# Patient Record
Sex: Female | Born: 1945
Health system: Southern US, Community
[De-identification: ages and names within clinical notes are randomized; demographics above are authoritative.]

## PROBLEM LIST (undated history)

## (undated) DIAGNOSIS — J189 Pneumonia, unspecified organism: Secondary | ICD-10-CM

## (undated) DIAGNOSIS — I469 Cardiac arrest, cause unspecified: Secondary | ICD-10-CM

## (undated) DIAGNOSIS — I5042 Chronic combined systolic (congestive) and diastolic (congestive) heart failure: Secondary | ICD-10-CM

## (undated) DIAGNOSIS — I4891 Unspecified atrial fibrillation: Secondary | ICD-10-CM

## (undated) DIAGNOSIS — I10 Essential (primary) hypertension: Secondary | ICD-10-CM

## (undated) DIAGNOSIS — R32 Unspecified urinary incontinence: Secondary | ICD-10-CM

## (undated) DIAGNOSIS — H409 Unspecified glaucoma: Secondary | ICD-10-CM

## (undated) DIAGNOSIS — I255 Ischemic cardiomyopathy: Secondary | ICD-10-CM

## (undated) DIAGNOSIS — I499 Cardiac arrhythmia, unspecified: Secondary | ICD-10-CM

## (undated) DIAGNOSIS — L209 Atopic dermatitis, unspecified: Secondary | ICD-10-CM

## (undated) DIAGNOSIS — J449 Chronic obstructive pulmonary disease, unspecified: Secondary | ICD-10-CM

## (undated) DIAGNOSIS — I779 Disorder of arteries and arterioles, unspecified: Secondary | ICD-10-CM

## (undated) DIAGNOSIS — T783XXA Angioneurotic edema, initial encounter: Secondary | ICD-10-CM

## (undated) DIAGNOSIS — M545 Low back pain, unspecified: Secondary | ICD-10-CM

## (undated) DIAGNOSIS — I2510 Atherosclerotic heart disease of native coronary artery without angina pectoris: Secondary | ICD-10-CM

## (undated) DIAGNOSIS — E119 Type 2 diabetes mellitus without complications: Secondary | ICD-10-CM

## (undated) DIAGNOSIS — H9190 Unspecified hearing loss, unspecified ear: Secondary | ICD-10-CM

## (undated) DIAGNOSIS — K219 Gastro-esophageal reflux disease without esophagitis: Secondary | ICD-10-CM

## (undated) DIAGNOSIS — I4819 Other persistent atrial fibrillation: Secondary | ICD-10-CM

## (undated) DIAGNOSIS — N183 Chronic kidney disease, stage 3 unspecified: Secondary | ICD-10-CM

## (undated) DIAGNOSIS — M109 Gout, unspecified: Secondary | ICD-10-CM

## (undated) DIAGNOSIS — K922 Gastrointestinal hemorrhage, unspecified: Secondary | ICD-10-CM

## (undated) DIAGNOSIS — I509 Heart failure, unspecified: Secondary | ICD-10-CM

## (undated) DIAGNOSIS — Z951 Presence of aortocoronary bypass graft: Secondary | ICD-10-CM

## (undated) DIAGNOSIS — R519 Headache, unspecified: Secondary | ICD-10-CM

## (undated) DIAGNOSIS — I5030 Unspecified diastolic (congestive) heart failure: Secondary | ICD-10-CM

## (undated) DIAGNOSIS — Z9289 Personal history of other medical treatment: Secondary | ICD-10-CM

## (undated) DIAGNOSIS — Z950 Presence of cardiac pacemaker: Secondary | ICD-10-CM

## (undated) DIAGNOSIS — M1990 Unspecified osteoarthritis, unspecified site: Secondary | ICD-10-CM

## (undated) DIAGNOSIS — R200 Anesthesia of skin: Secondary | ICD-10-CM

## (undated) DIAGNOSIS — E876 Hypokalemia: Secondary | ICD-10-CM

## (undated) DIAGNOSIS — I480 Paroxysmal atrial fibrillation: Secondary | ICD-10-CM

## (undated) DIAGNOSIS — J45909 Unspecified asthma, uncomplicated: Secondary | ICD-10-CM

## (undated) DIAGNOSIS — K228 Other specified diseases of esophagus: Secondary | ICD-10-CM

## (undated) DIAGNOSIS — D259 Leiomyoma of uterus, unspecified: Secondary | ICD-10-CM

## (undated) DIAGNOSIS — Z9119 Patient's noncompliance with other medical treatment and regimen: Secondary | ICD-10-CM

## (undated) DIAGNOSIS — R7303 Prediabetes: Secondary | ICD-10-CM

## (undated) DIAGNOSIS — Z91199 Patient's noncompliance with other medical treatment and regimen due to unspecified reason: Secondary | ICD-10-CM

## (undated) DIAGNOSIS — J9600 Acute respiratory failure, unspecified whether with hypoxia or hypercapnia: Secondary | ICD-10-CM

## (undated) HISTORY — DX: Unspecified osteoarthritis, unspecified site: M19.90

## (undated) HISTORY — DX: Low back pain, unspecified: M54.50

## (undated) HISTORY — DX: Gastro-esophageal reflux disease without esophagitis: K21.9

## (undated) HISTORY — DX: Essential (primary) hypertension: I10

## (undated) HISTORY — PX: INSERT / REPLACE / REMOVE PACEMAKER: SUR710

## (undated) HISTORY — DX: Angioneurotic edema, initial encounter: T78.3XXA

## (undated) HISTORY — PX: PACEMAKER IMPLANT: EP1218

## (undated) HISTORY — DX: Headache, unspecified: R51.9

## (undated) HISTORY — DX: Leiomyoma of uterus, unspecified: D25.9

## (undated) HISTORY — DX: Chronic obstructive pulmonary disease, unspecified: J44.9

## (undated) HISTORY — DX: Unspecified asthma, uncomplicated: J45.909

## (undated) HISTORY — DX: Unspecified glaucoma: H40.9

## (undated) HISTORY — DX: Atopic dermatitis, unspecified: L20.9

## (undated) HISTORY — DX: Unspecified urinary incontinence: R32

## (undated) HISTORY — PX: TOOTH EXTRACTION: SUR596

## (undated) HISTORY — DX: Prediabetes: R73.03

---
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
## 1999-07-12 ENCOUNTER — Emergency Department (HOSPITAL_COMMUNITY): Admission: EM | Admit: 1999-07-12 | Discharge: 1999-07-12 | Payer: Self-pay | Admitting: Emergency Medicine

## 2001-05-25 ENCOUNTER — Emergency Department (HOSPITAL_COMMUNITY): Admission: EM | Admit: 2001-05-25 | Discharge: 2001-05-25 | Payer: Self-pay | Admitting: Emergency Medicine

## 2001-05-25 ENCOUNTER — Encounter: Payer: Self-pay | Admitting: Emergency Medicine

## 2002-08-21 ENCOUNTER — Emergency Department (HOSPITAL_COMMUNITY): Admission: EM | Admit: 2002-08-21 | Discharge: 2002-08-21 | Payer: Self-pay | Admitting: Emergency Medicine

## 2002-12-09 ENCOUNTER — Emergency Department (HOSPITAL_COMMUNITY): Admission: EM | Admit: 2002-12-09 | Discharge: 2002-12-09 | Payer: Self-pay | Admitting: Emergency Medicine

## 2003-02-02 ENCOUNTER — Observation Stay (HOSPITAL_COMMUNITY): Admission: EM | Admit: 2003-02-02 | Discharge: 2003-02-03 | Payer: Self-pay | Admitting: Emergency Medicine

## 2003-03-19 ENCOUNTER — Emergency Department (HOSPITAL_COMMUNITY): Admission: EM | Admit: 2003-03-19 | Discharge: 2003-03-19 | Payer: Self-pay | Admitting: Emergency Medicine

## 2003-05-06 ENCOUNTER — Other Ambulatory Visit: Admission: RE | Admit: 2003-05-06 | Discharge: 2003-05-06 | Payer: Self-pay | Admitting: Obstetrics and Gynecology

## 2004-02-28 ENCOUNTER — Emergency Department (HOSPITAL_COMMUNITY): Admission: EM | Admit: 2004-02-28 | Discharge: 2004-02-28 | Payer: Self-pay | Admitting: Emergency Medicine

## 2004-02-28 ENCOUNTER — Emergency Department (HOSPITAL_COMMUNITY): Admission: EM | Admit: 2004-02-28 | Discharge: 2004-02-28 | Payer: Self-pay | Admitting: *Deleted

## 2004-03-26 ENCOUNTER — Emergency Department (HOSPITAL_COMMUNITY): Admission: EM | Admit: 2004-03-26 | Discharge: 2004-03-26 | Payer: Self-pay | Admitting: Emergency Medicine

## 2004-08-03 ENCOUNTER — Emergency Department (HOSPITAL_COMMUNITY): Admission: EM | Admit: 2004-08-03 | Discharge: 2004-08-04 | Payer: Self-pay | Admitting: *Deleted

## 2004-10-19 ENCOUNTER — Emergency Department (HOSPITAL_COMMUNITY): Admission: EM | Admit: 2004-10-19 | Discharge: 2004-10-19 | Payer: Self-pay | Admitting: Emergency Medicine

## 2005-01-03 ENCOUNTER — Other Ambulatory Visit: Admission: RE | Admit: 2005-01-03 | Discharge: 2005-01-03 | Payer: Self-pay | Admitting: Obstetrics and Gynecology

## 2005-04-21 ENCOUNTER — Emergency Department (HOSPITAL_COMMUNITY): Admission: EM | Admit: 2005-04-21 | Discharge: 2005-04-21 | Payer: Self-pay | Admitting: Emergency Medicine

## 2006-02-09 ENCOUNTER — Other Ambulatory Visit: Admission: RE | Admit: 2006-02-09 | Discharge: 2006-02-09 | Payer: Self-pay | Admitting: Internal Medicine

## 2006-02-22 ENCOUNTER — Encounter: Admission: RE | Admit: 2006-02-22 | Discharge: 2006-02-22 | Payer: Self-pay | Admitting: Internal Medicine

## 2006-03-14 ENCOUNTER — Observation Stay (HOSPITAL_COMMUNITY): Admission: EM | Admit: 2006-03-14 | Discharge: 2006-03-15 | Payer: Self-pay | Admitting: Emergency Medicine

## 2006-12-06 ENCOUNTER — Encounter: Admission: RE | Admit: 2006-12-06 | Discharge: 2006-12-06 | Payer: Self-pay | Admitting: Internal Medicine

## 2006-12-14 ENCOUNTER — Encounter: Admission: RE | Admit: 2006-12-14 | Discharge: 2006-12-14 | Payer: Self-pay | Admitting: Internal Medicine

## 2007-03-16 ENCOUNTER — Encounter: Admission: RE | Admit: 2007-03-16 | Discharge: 2007-03-16 | Payer: Self-pay | Admitting: Internal Medicine

## 2007-09-02 ENCOUNTER — Encounter: Admission: RE | Admit: 2007-09-02 | Discharge: 2007-09-02 | Payer: Self-pay | Admitting: Internal Medicine

## 2007-09-12 ENCOUNTER — Emergency Department (HOSPITAL_COMMUNITY): Admission: EM | Admit: 2007-09-12 | Discharge: 2007-09-12 | Payer: Self-pay | Admitting: Emergency Medicine

## 2007-09-19 ENCOUNTER — Encounter: Admission: RE | Admit: 2007-09-19 | Discharge: 2007-09-19 | Payer: Self-pay | Admitting: Internal Medicine

## 2008-02-28 ENCOUNTER — Other Ambulatory Visit: Admission: RE | Admit: 2008-02-28 | Discharge: 2008-02-28 | Payer: Self-pay | Admitting: Obstetrics and Gynecology

## 2008-09-25 ENCOUNTER — Encounter: Admission: RE | Admit: 2008-09-25 | Discharge: 2008-09-25 | Payer: Self-pay | Admitting: Obstetrics and Gynecology

## 2009-02-12 ENCOUNTER — Emergency Department (HOSPITAL_COMMUNITY): Admission: EM | Admit: 2009-02-12 | Discharge: 2009-02-12 | Payer: Self-pay | Admitting: Emergency Medicine

## 2009-10-20 ENCOUNTER — Encounter: Admission: RE | Admit: 2009-10-20 | Discharge: 2009-10-20 | Payer: Self-pay | Admitting: Internal Medicine

## 2010-04-12 ENCOUNTER — Emergency Department (HOSPITAL_COMMUNITY): Admission: EM | Admit: 2010-04-12 | Discharge: 2010-04-13 | Payer: Self-pay | Admitting: Emergency Medicine

## 2010-05-14 ENCOUNTER — Other Ambulatory Visit: Admission: RE | Admit: 2010-05-14 | Discharge: 2010-05-14 | Payer: Self-pay | Admitting: Obstetrics and Gynecology

## 2010-09-05 ENCOUNTER — Encounter: Payer: Self-pay | Admitting: Internal Medicine

## 2010-11-21 LAB — CBC
HCT: 43.8 % (ref 36.0–46.0)
Hemoglobin: 14.8 g/dL (ref 12.0–15.0)
MCHC: 33.7 g/dL (ref 30.0–36.0)
MCV: 89.8 fL (ref 78.0–100.0)
Platelets: 139 10*3/uL — ABNORMAL LOW (ref 150–400)
RBC: 4.88 MIL/uL (ref 3.87–5.11)
RDW: 15.6 % — ABNORMAL HIGH (ref 11.5–15.5)
WBC: 11.4 10*3/uL — ABNORMAL HIGH (ref 4.0–10.5)

## 2010-11-21 LAB — COMPREHENSIVE METABOLIC PANEL
ALT: 13 U/L (ref 0–35)
AST: 16 U/L (ref 0–37)
Albumin: 3.3 g/dL — ABNORMAL LOW (ref 3.5–5.2)
Alkaline Phosphatase: 84 U/L (ref 39–117)
BUN: 15 mg/dL (ref 6–23)
CO2: 27 mEq/L (ref 19–32)
Calcium: 8.7 mg/dL (ref 8.4–10.5)
Chloride: 106 mEq/L (ref 96–112)
Creatinine, Ser: 1.13 mg/dL (ref 0.4–1.2)
GFR calc Af Amer: 59 mL/min — ABNORMAL LOW (ref >60)
GFR calc non Af Amer: 48 mL/min — ABNORMAL LOW (ref >60)
Glucose, Bld: 92 mg/dL (ref 70–99)
Potassium: 2.8 mEq/L — ABNORMAL LOW (ref 3.5–5.1)
Sodium: 141 mEq/L (ref 135–145)
Total Bilirubin: 0.4 mg/dL (ref 0.3–1.2)
Total Protein: 6.7 g/dL (ref 6.0–8.3)

## 2010-11-21 LAB — URINE MICROSCOPIC-ADD ON

## 2010-11-21 LAB — URINALYSIS, ROUTINE W REFLEX MICROSCOPIC
Glucose, UA: NEGATIVE mg/dL
Hgb urine dipstick: NEGATIVE
Nitrite: NEGATIVE
Protein, ur: 30 mg/dL — AB
Specific Gravity, Urine: 1.037 — ABNORMAL HIGH (ref 1.005–1.030)
Urobilinogen, UA: 1 mg/dL (ref 0.0–1.0)
pH: 6 (ref 5.0–8.0)

## 2010-11-21 LAB — DIFFERENTIAL
Basophils Absolute: 0.1 10*3/uL (ref 0.0–0.1)
Basophils Relative: 1 % (ref 0–1)
Eosinophils Absolute: 0.1 10*3/uL (ref 0.0–0.7)
Eosinophils Relative: 1 % (ref 0–5)
Lymphocytes Relative: 20 % (ref 12–46)
Lymphs Abs: 2.3 10*3/uL (ref 0.7–4.0)
Monocytes Absolute: 0.5 10*3/uL (ref 0.1–1.0)
Monocytes Relative: 4 % (ref 3–12)
Neutro Abs: 8.4 10*3/uL — ABNORMAL HIGH (ref 1.7–7.7)
Neutrophils Relative %: 74 % (ref 43–77)

## 2010-11-21 LAB — LIPASE, BLOOD: Lipase: 26 U/L (ref 11–59)

## 2010-12-31 NOTE — Discharge Summary (Signed)
NAMEJOLICIA, DELIRA NO.:  [id number]   MEDICAL RECORD NO.:  [id number]          PATIENT TYPE:  OBV   LOCATION:  1431                         FACILITY:  [facility]   PHYSICIAN:  Georgann Housekeeper, MD      DATE OF BIRTH:  Mar 12, 1946   DATE OF ADMISSION:  03/14/2006  DATE OF DISCHARGE:  03/15/2006                                 DISCHARGE SUMMARY   DISCHARGE DIAGNOSES:  1.  Angioedema with swelling of the tongue.  2.  Hypertension.  3.  Mild hypokalemia secondary to diuretic.   DISCHARGE MEDICATIONS:  1.  Tenoretic 50/25 one a day.  2.  Benadryl 25 mg 4-6 h. p.r.n.  3.  Claritin 10 mg once a day.  4.  Pepcid 20 mg twice a day.  5.  __________ 25 mg at bedtime for itching.  6.  Albuterol MDI p.r.n.  7.  The prednisone the patient has at home does not need to use it now.   FOLLOWUP:  In 2 weeks at the office.   CONDITION ON DISCHARGE:  Stable.   HOSPITAL COURSE:  A 65 year old female with a history of idiopathic  angioedema and hypertension admitted with the episode of swelling of the  tongue and the lip, unable to talk. No respiratory distress, no wheezing, no  shortness of breath. As far as the patient was started on Solu-Medrol in the  emergency room, continued on IV Solu-Medrol, IV Benadryl and Pepcid. Within  24 hours, the swelling of the tongue and lips had completely resolved. She  has been worked up for this by the allergist, Dr. Lacretia Nicks and Dr. Corinda Gubler,  unclear etiology. As far as her might need to look into she has had  prednisone at home and Benadryl. As far as her hypertension, continue on  Tenoretic. She had mild hypokalemia secondary to diuretic. She is reportedly  unable to tolerate the potassium. She said it makes her sick. I encouraged  her to get dietary potassium supplement with bananas. As far as the followup  in 2 weeks.      Georgann Housekeeper, MD  Electronically Signed     KH/MEDQ  D:  03/15/2006  T:  03/15/2006  Job:  [id number]

## 2010-12-31 NOTE — H&P (Signed)
NAME:  RENAI, LOPATA NO.:  [id number]   MEDICAL RECORD NO.:  [id number]          PATIENT TYPE:  OBV   LOCATION:  1431                         FACILITY:  [facility]   PHYSICIAN:  Georgann Housekeeper, MD      DATE OF BIRTH:  March 19, 1946   DATE OF ADMISSION:  03/14/2006  DATE OF DISCHARGE:                                HISTORY & PHYSICAL   CHIEF COMPLAINT:  Tongue and lip swelling.   HISTORY OF PRESENT ILLNESS:  A 65 year old female with history of  hypertension, idiopathic angioedema, usually gets the lip swelling and takes  prednisone and carried Epipen.  She has been worked up by the allergist, Dr.  Lacretia Nicks, as well as Dr. Corinda Gubler in the past for unclear etiology.  They  thought initially was ACE, but even after she stopped the ACE, she had  continued to have that.  Had allergy testing.  Mostly gets the lip swelling.  The patient said yesterday, she was in her usual state of health, started  having swelling in the lip and a little bit in the tongue.  She took a  prednisone and Benadryl.  Did not have any benefit.  She continues to have  the swelling in the tongue this morning, got worse, unable to talk and  swallow.  Denied any respiratory distress, no wheezing,  no chest pain or  shortness of breath.  She had no unusual foods, no new medications thus far.  She said the cholesterol medication was making her tired and achy.  She  stopped taking that.  Also the potassium pills she takes gives her headache  and she has not been taking that.  She denies any fever, no nausea, no  vomiting.   PAST MEDICAL HISTORY:  Allergy to ACE INHIBITOR.   MEDICATIONS:  1.  Tenoretic 50/25 one a day.  2.  Atarax 25 mg as needed for itching.  3.  Albuterol MDI p.r.n.  4.  Potassium 10 mg q.d. not taking regularly.  5.  Benadryl p.r.n.  6.  Prednisone p.r.n.  7.  Epipen.   PAST MEDICAL HISTORY:  1.  Angioedema idiopathic.  Seen Dr. Lacretia Nicks of allergy/immunology.      Similar  admission in June of 2004.  2.  Hypertension.  3.  Tobacco use.  4.  Urinary incontinence.  5.  Acid reflux disease.   PAST SURGICAL HISTORY:  D&C.   SOCIAL HISTORY:  Tobacco half a pack per day.  Alcohol none.  She is a  widow, one granddaughter, one grown up daughter.  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   FAMILY HISTORY:  Mother had bone cancer.  Brother and sister, two, and both  of them in [place].   PHYSICAL EXAMINATION:  VITAL SIGNS:  Temperature 98.2, blood pressure  151/60, pulse 48, respiratory rate 16.  GENERAL:  Awake, alert, in no acute distress.  Reportedly decreased swelling  in this morning.  LUNGS:  Clear.  NECK:  Supple.  CARDIOVASCULAR:  Normal S1 and S2.  EXTREMITIES:  No edema.  HEENT:  Tongue still had some  swelling, lips minimal swelling.   LABORATORY DATA:  White count 12,100.  Sodium 141, potassium 3.3, BUN 14,  creatinine 0.9, glucose 110.   IMPRESSION:  The patient is a 65 year old female with a history of  idiopathic angioedema, now presented with tongue and lip swelling, tried  prednisone at home.   Problem #1:  Angioedema, idiopathic.  Problem #2:  Hypertension.   PLAN:  Admit for observation.  IV Solu-Medrol q.6 h., IV Benadryl,  IV  Pepcid.  As far as the hypertension, continue on Tenoretic.  Mild  hypokalemia secondary to Tenoretic.  Potassium replacement.  Follow blood  pressure while she is on steroids.  Acid reflux disease on Pepcid.      Georgann Housekeeper, MD  Electronically Signed     KH/MEDQ  D:  03/14/2006  T:  03/14/2006  Job:  [id number]

## 2011-08-07 ENCOUNTER — Ambulatory Visit (INDEPENDENT_AMBULATORY_CARE_PROVIDER_SITE_OTHER): Payer: Medicare Other

## 2011-08-07 DIAGNOSIS — S93629A Sprain of tarsometatarsal ligament of unspecified foot, initial encounter: Secondary | ICD-10-CM

## 2011-08-07 DIAGNOSIS — S93409A Sprain of unspecified ligament of unspecified ankle, initial encounter: Secondary | ICD-10-CM

## 2011-08-07 DIAGNOSIS — I10 Essential (primary) hypertension: Secondary | ICD-10-CM

## 2011-08-07 DIAGNOSIS — M79609 Pain in unspecified limb: Secondary | ICD-10-CM

## 2011-08-16 DIAGNOSIS — K2289 Other specified disease of esophagus: Secondary | ICD-10-CM

## 2011-08-16 HISTORY — DX: Other specified disease of esophagus: K22.89

## 2011-08-16 HISTORY — PX: COLONOSCOPY: SHX174

## 2011-09-16 ENCOUNTER — Ambulatory Visit: Payer: Medicare Other

## 2011-09-16 ENCOUNTER — Ambulatory Visit (INDEPENDENT_AMBULATORY_CARE_PROVIDER_SITE_OTHER): Payer: Medicare Other | Admitting: Family Medicine

## 2011-09-16 ENCOUNTER — Ambulatory Visit (INDEPENDENT_AMBULATORY_CARE_PROVIDER_SITE_OTHER): Payer: Medicare Other

## 2011-09-16 VITALS — BP 178/81 | HR 56 | Temp 99.1°F | Resp 18 | Ht 68.0 in | Wt 210.0 lb

## 2011-09-16 DIAGNOSIS — I10 Essential (primary) hypertension: Secondary | ICD-10-CM

## 2011-09-16 DIAGNOSIS — M25469 Effusion, unspecified knee: Secondary | ICD-10-CM

## 2011-09-16 DIAGNOSIS — M25569 Pain in unspecified knee: Secondary | ICD-10-CM

## 2011-09-16 MED ORDER — TRAMADOL HCL 50 MG PO TABS: 50.0000 mg | ORAL_TABLET | Freq: Three times a day (TID) | ORAL | Status: AC | PRN

## 2011-09-16 MED ORDER — NAPROXEN 500 MG PO TABS: 500.0000 mg | ORAL_TABLET | Freq: Two times a day (BID) | ORAL | Status: DC

## 2011-09-16 NOTE — Progress Notes (Signed)
  Subjective:    Patient ID: [name] [name], female    DOB: 1945/09/08, 67 y.o.   MRN: [id number]  Knee Pain  The incident occurred more than 1 week ago. The incident occurred at home. The injury mechanism is unknown. The pain is present in the right knee. The quality of the pain is described as aching. The pain is severe. The pain has been constant since onset. Associated symptoms include a loss of motion. Pertinent negatives include no inability to bear weight, loss of sensation, muscle weakness, numbness or tingling.      Review of Systems  Constitutional: Negative for fever and chills.  Respiratory: Negative for shortness of breath and wheezing.   Cardiovascular: Negative for chest pain and palpitations.  Gastrointestinal: Negative for abdominal distention.  Genitourinary: Negative for dysuria and hematuria.  Musculoskeletal: Positive for joint swelling and arthralgias.  Neurological: Negative for tingling, weakness and numbness.  Psychiatric/Behavioral: Negative for behavioral problems.       Objective:   Physical Exam  Constitutional: She is oriented to person, place, and time. She appears well-developed and well-nourished.  HENT:  Head: Normocephalic and atraumatic.  Eyes: EOM are normal.  Neck: Normal range of motion. Neck supple.  Cardiovascular: Normal rate, regular rhythm and normal heart sounds.   Pulmonary/Chest: Effort normal and breath sounds normal.  Abdominal: Soft. Bowel sounds are normal.  Neurological: She is alert and oriented to person, place, and time. She has normal reflexes.  Skin: Skin is warm and dry.  Psychiatric: She has a normal mood and affect.   Right knee exam: mild knee swelling at anterior knee, superior patellar. She has weak quadriceps.  Full PROM/AROM. Neurovascular intact.        Assessment & Plan:  1. Right knee pain-most likley knee sprain. Will give pt knee brace 2. HTN- possibly high with knee pain. Asked pt to take BP meds regular  and also keep BP log.  [facility] reading (PRIMARY) by  Dr. Conley Rolls: Right knee-no fx or dislocation.

## 2012-02-06 ENCOUNTER — Other Ambulatory Visit: Payer: Self-pay | Admitting: Specialist

## 2012-02-06 DIAGNOSIS — R1310 Dysphagia, unspecified: Secondary | ICD-10-CM

## 2012-02-14 ENCOUNTER — Ambulatory Visit
Admission: RE | Admit: 2012-02-14 | Discharge: 2012-02-14 | Disposition: A | Discharge status: Home / Self Care | Payer: Medicare Other | Source: Ambulatory Visit | Attending: Specialist | Admitting: Specialist

## 2012-02-14 DIAGNOSIS — R1310 Dysphagia, unspecified: Secondary | ICD-10-CM

## 2012-03-15 ENCOUNTER — Ambulatory Visit: Payer: Medicare Other

## 2012-03-15 ENCOUNTER — Ambulatory Visit (INDEPENDENT_AMBULATORY_CARE_PROVIDER_SITE_OTHER): Payer: Medicare Other | Admitting: Family Medicine

## 2012-03-15 VITALS — BP 146/84 | HR 64 | Temp 98.1°F | Resp 16

## 2012-03-15 DIAGNOSIS — L02619 Cutaneous abscess of unspecified foot: Secondary | ICD-10-CM

## 2012-03-15 DIAGNOSIS — M79672 Pain in left foot: Secondary | ICD-10-CM

## 2012-03-15 DIAGNOSIS — L03119 Cellulitis of unspecified part of limb: Secondary | ICD-10-CM

## 2012-03-15 DIAGNOSIS — M7989 Other specified soft tissue disorders: Secondary | ICD-10-CM

## 2012-03-15 DIAGNOSIS — M79609 Pain in unspecified limb: Secondary | ICD-10-CM

## 2012-03-15 LAB — POCT CBC
Granulocyte percent: 72.2 %G (ref 37–80)
HCT, POC: 48.5 % — AB (ref 37.7–47.9)
Hemoglobin: 15.1 g/dL (ref 12.2–16.2)
Lymph, poc: 2.6 (ref 0.6–3.4)
MCH, POC: 28.4 pg (ref 27–31.2)
MCHC: 31.1 g/dL — AB (ref 31.8–35.4)
MCV: 91.1 fL (ref 80–97)
MID (cbc): 0.7 (ref 0–0.9)
MPV: 11.7 fL (ref 0–99.8)
POC Granulocyte: 8.6 — AB (ref 2–6.9)
POC LYMPH PERCENT: 22.2 %L (ref 10–50)
POC MID %: 5.6 %M (ref 0–12)
Platelet Count, POC: 166 10*3/uL (ref 142–424)
RBC: 5.32 M/uL (ref 4.04–5.48)
RDW, POC: 15.8 %
WBC: 11.9 10*3/uL — AB (ref 4.6–10.2)

## 2012-03-15 LAB — URIC ACID: Uric Acid, Serum: 9.7 mg/dL — ABNORMAL HIGH (ref 2.4–7.0)

## 2012-03-15 MED ORDER — DOXYCYCLINE HYCLATE 100 MG PO TABS: 100.0000 mg | ORAL_TABLET | Freq: Two times a day (BID) | ORAL | Status: AC

## 2012-03-15 MED ORDER — HYDROCODONE-ACETAMINOPHEN 5-325 MG PO TABS: 1.0000 | ORAL_TABLET | Freq: Four times a day (QID) | ORAL | Status: AC | PRN

## 2012-03-15 MED ORDER — IBUPROFEN 600 MG PO TABS: 600.0000 mg | ORAL_TABLET | Freq: Four times a day (QID) | ORAL | Status: AC | PRN

## 2012-03-15 NOTE — Progress Notes (Signed)
Subjective:    Patient ID: [name] [name], female    DOB: 01-Aug-1946, 66 y.o.   MRN: [id number]  HPI [name] [name] is a 66 y.o. female Complains of  L foot pain - started 2 or 3 days ago.  Nki.  But ate fried pork skins last week.  No known hx of gout.  Difficulty with weight bear - hurts to touch it or even put on shoe. Pain on top of foot.  Sometimes when more pain - moves up to ankle. No hx of DVT.  No recent air travel or prolonged car travel. No chest pain, or SOB, but has to favor leg.   TX:  Advil - 1or 2 during day.  Min relief. Alcohol, icy hot.   Primary care: Dr. Rene Paci  Review of Systems  Constitutional: Negative for fever and chills.  Musculoskeletal: Positive for arthralgias.  Skin: Positive for color change.       Red and warm - top of foot.         Objective:   Physical Exam  Constitutional: She is oriented to person, place, and time. She appears well-developed and well-nourished.  HENT:  Head: Normocephalic and atraumatic.  Cardiovascular: Normal rate, regular rhythm, normal heart sounds and intact distal pulses.   Pulmonary/Chest: Effort normal.  Musculoskeletal:       Feet:       Calf nt, no erythema proximal to dorsum of foot.   Neurological: She is alert and oriented to person, place, and time.  Skin: Skin is warm.  Psychiatric: She has a normal mood and affect. Her behavior is normal.   Results for orders placed in visit on 03/15/12  POCT CBC      Component Value Range   WBC 11.9 (*) 4.6 - 10.2 K/uL   Lymph, poc 2.6  0.6 - 3.4   POC LYMPH PERCENT 22.2  10 - 50 %L   MID (cbc) 0.7  0 - 0.9   POC MID % 5.6  0 - 12 %M   POC Granulocyte 8.6 (*) 2 - 6.9   Granulocyte percent 72.2  37 - 80 %G   RBC 5.32  4.04 - 5.48 M/uL   Hemoglobin 15.1  12.2 - 16.2 g/dL   HCT, POC 40.9 (*) 81.1 - 47.9 %   MCV 91.1  80 - 97 fL   MCH, POC 28.4  27 - 31.2 pg   MCHC 31.1 (*) 31.8 - 35.4 g/dL   RDW, POC 91.4     Platelet Count, POC 166  142 - 424 K/uL   MPV 11.7  0 -  99.8 fL   [facility] reading (PRIMARY) by  Dr. Neva Seat: L foot - NAD.      Assessment & Plan:  [name] [name] is a 66 y.o. female 1. Foot pain, left  POCT CBC, Uric Acid, DG Foot Complete Left, HYDROcodone-acetaminophen (NORCO/VICODIN) 5-325 MG per tablet, ibuprofen (ADVIL,MOTRIN) 600 MG tablet  2. Foot swelling  POCT CBC, Uric Acid, DG Foot Complete Left, ibuprofen (ADVIL,MOTRIN) 600 MG tablet  3. Cellulitis of foot  doxycycline (VIBRA-TABS) 100 MG tablet     Possible infectious (cellulitis) vs inflammatory (gout- but non known hx of this).  Check uric acid.  Start doxycycline 100mg  BID, ibuprofen 600mg  Q6h prn with food.  - short course with hx htn and watch outside BP's. If increased pain - lortab.  Sed. Crutches, post-op shoe and recheck in 24-48 hours if not improving - sooner if worse.  Patient Instructions  You can start the antibiotic - twice per day, and ibuprofen up to every 6 hours as needed with food for pain and inflammation. If you need a stronger medicine for pain - you can take the hydrocodone, but this can make you sleepy or dizzy - be careful with this medicine.  Keep a record of your blood pressures outside of the office and bring them to the next office visit, as the ibuprofen can sometimes increase this.  We will only use this as long as necessary for the foot pain.  Recheck in 2 days - Return to the clinic or go to the nearest emergency room if any of your symptoms worsen or new symptoms occur.

## 2012-03-15 NOTE — Patient Instructions (Signed)
You can start the antibiotic - twice per day, and ibuprofen up to every 6 hours as needed with food for pain and inflammation. If you need a stronger medicine for pain - you can take the hydrocodone, but this can make you sleepy or dizzy - be careful with this medicine.  Keep a record of your blood pressures outside of the office and bring them to the next office visit, as the ibuprofen can sometimes increase this.  We will only use this as long as necessary for the foot pain.  Recheck in 2 days - Return to the clinic or go to the nearest emergency room if any of your symptoms worsen or new symptoms occur.

## 2012-03-17 ENCOUNTER — Ambulatory Visit (INDEPENDENT_AMBULATORY_CARE_PROVIDER_SITE_OTHER): Payer: Medicare Other | Admitting: Family Medicine

## 2012-03-17 VITALS — BP 134/74 | HR 54 | Temp 97.6°F | Resp 16 | Ht 67.5 in | Wt 214.0 lb

## 2012-03-17 DIAGNOSIS — T887XXA Unspecified adverse effect of drug or medicament, initial encounter: Secondary | ICD-10-CM

## 2012-03-17 DIAGNOSIS — M109 Gout, unspecified: Secondary | ICD-10-CM

## 2012-03-17 DIAGNOSIS — T50905A Adverse effect of unspecified drugs, medicaments and biological substances, initial encounter: Secondary | ICD-10-CM

## 2012-03-17 DIAGNOSIS — E119 Type 2 diabetes mellitus without complications: Secondary | ICD-10-CM

## 2012-03-17 LAB — GLUCOSE, POCT (MANUAL RESULT ENTRY): POC Glucose: 102 mg/dl — AB (ref 70–99)

## 2012-03-17 MED ORDER — PREDNISONE 20 MG PO TABS: ORAL_TABLET | ORAL | Status: AC

## 2012-03-17 NOTE — Patient Instructions (Signed)
Take the prednisone as directed Return in one to 2 weeks to recheck labs. You may need to be placed on long-term medication for the gout. Continue taking the ibuprofen, but I would recommend taking a whole pill 2-3 times daily.  Gout Gout is an inflammatory condition (arthritis) caused by a buildup of uric acid crystals in the joints. Uric acid is a chemical that is normally present in the blood. Under some circumstances, uric acid can form into crystals in your joints. This causes joint redness, soreness, and swelling (inflammation). Repeat attacks are common. Over time, uric acid crystals can form into masses (tophi) near a joint, causing disfigurement. Gout is treatable and often preventable. CAUSES  The disease begins with elevated levels of uric acid in the blood. Uric acid is produced by your body when it breaks down a naturally found substance called purines. This also happens when you eat certain foods such as meats and fish. Causes of an elevated uric acid level include:  Being passed down from parent to child (heredity).   Diseases that cause increased uric acid production (obesity, psoriasis, some cancers).   Excessive alcohol use.   Diet, especially diets rich in meat and seafood.   Medicines, including certain cancer-fighting drugs (chemotherapy), diuretics, and aspirin.   Chronic kidney disease. The kidneys are no longer able to remove uric acid well.   Problems with metabolism.  Conditions strongly associated with gout include:  Obesity.   High blood pressure.   High cholesterol.   Diabetes  Not everyone with elevated uric acid levels gets gout. It is not understood why some people get gout and others do not. Surgery, joint injury, and eating too much of certain foods are some of the factors that can lead to gout. SYMPTOMS   An attack of gout comes on quickly. It causes intense pain with redness, swelling, and warmth in a joint.   Fever can occur.   Often, only  one joint is involved. Certain joints are more commonly involved:   Base of the big toe.   Knee.   Ankle.   Wrist.   Finger.  Without treatment, an attack usually goes away in a few days to weeks. Between attacks, you usually will not have symptoms, which is different from many other forms of arthritis. DIAGNOSIS  Your caregiver will suspect gout based on your symptoms and exam. Removal of fluid from the joint (arthrocentesis) is done to check for uric acid crystals. Your caregiver will give you a medicine that numbs the area (local anesthetic) and use a needle to remove joint fluid for exam. Gout is confirmed when uric acid crystals are seen in joint fluid, using a special microscope. Sometimes, blood, urine, and X-ray tests are also used. TREATMENT  There are 2 phases to gout treatment: treating the sudden onset (acute) attack and preventing attacks (prophylaxis). Treatment of an Acute Attack  Medicines are used. These include anti-inflammatory medicines or steroid medicines.   An injection of steroid medicine into the affected joint is sometimes necessary.   The painful joint is rested. Movement can worsen the arthritis.   You may use warm or cold treatments on painful joints, depending which works best for you.   Discuss the use of coffee, vitamin C, or cherries with your caregiver. These may be helpful treatment options.  Treatment to Prevent Attacks After the acute attack subsides, your caregiver may advise prophylactic medicine. These medicines either help your kidneys eliminate uric acid from your body or decrease your  uric acid production. You may need to stay on these medicines for a very long time. The early phase of treatment with prophylactic medicine can be associated with an increase in acute gout attacks. For this reason, during the first few months of treatment, your caregiver may also advise you to take medicines usually used for acute gout treatment. Be sure you  understand your caregiver's directions. You should also discuss dietary treatment with your caregiver. Certain foods such as meats and fish can increase uric acid levels. Other foods such as dairy can decrease levels. Your caregiver can give you a list of foods to avoid. HOME CARE INSTRUCTIONS   Do not take aspirin to relieve pain. This raises uric acid levels.   Only take over-the-counter or prescription medicines for pain, discomfort, or fever as directed by your caregiver.   Rest the joint as much as possible. When in bed, keep sheets and blankets off painful areas.   Keep the affected joint raised (elevated).   Use crutches if the painful joint is in your leg.   Drink enough water and fluids to keep your urine clear or pale yellow. This helps your body get rid of uric acid. Do not drink alcoholic beverages. They slow the passage of uric acid.   Follow your caregiver's dietary instructions. Pay careful attention to the amount of protein you eat. Your daily diet should emphasize fruits, vegetables, whole grains, and fat-free or low-fat milk products.   Maintain a healthy body weight.  SEEK MEDICAL CARE IF:   You have an oral temperature above 102 F (38.9 C).   You develop diarrhea, vomiting, or any side effects from medicines.   You do not feel better in 24 hours, or you are getting worse.  SEEK IMMEDIATE MEDICAL CARE IF:   Your joint becomes suddenly more tender and you have:   Chills.   An oral temperature above 102 F (38.9 C), not controlled by medicine.  MAKE SURE YOU:   Understand these instructions.   Will watch your condition.   Will get help right away if you are not doing well or get worse.  Document Released: 07/29/2000 Document Revised: 07/21/2011 Document Reviewed: 11/09/2009 [facility] Patient Information 2012 [place], [place].

## 2012-03-17 NOTE — Progress Notes (Signed)
Subjective: Patient is here for recheck on left foot. It is less painful than it was, but it is still hurting her. She has only been taking one half of Motrin twice a day because she did not want to raise her blood pressure. It hurts very badly she walks on it. She is a CNA and cares for a patient. She hasn't made says she has not missed work.  Objective: Left foot is swollen on the lateral half of the foot from the tarsal bones down to the PIP joints of the third fourth and fifth toe. It is mildly erythematous. Is very tender, especially in the tarsal metatarsal joints.    Results for orders placed in visit on 03/15/12  POCT CBC      Component Value Range   WBC 11.9 (*) 4.6 - 10.2 K/uL   Lymph, poc 2.6  0.6 - 3.4   POC LYMPH PERCENT 22.2  10 - 50 %L   MID (cbc) 0.7  0 - 0.9   POC MID % 5.6  0 - 12 %M   POC Granulocyte 8.6 (*) 2 - 6.9   Granulocyte percent 72.2  37 - 80 %G   RBC 5.32  4.04 - 5.48 M/uL   Hemoglobin 15.1  12.2 - 16.2 g/dL   HCT, POC 11.9 (*) 14.7 - 47.9 %   MCV 91.1  80 - 97 fL   MCH, POC 28.4  27 - 31.2 pg   MCHC 31.1 (*) 31.8 - 35.4 g/dL   RDW, POC 82.9     Platelet Count, POC 166  142 - 424 K/uL   MPV 11.7  0 - 99.8 fL  URIC ACID      Component Value Range   Uric Acid, Serum 9.7 (*) 2.4 - 7.0 mg/dL   Assessment Acute gouty arthritis Hyperuricemia  Plan: Do a fingerstick sugar to make sure that that is okay. If it is normal we will proceed with placed her on steroids. She needs to return again in a week or 2 at which time a followup uric acid level will need to be done. If her uric acid remains high she should be placed on long-term medications such as allopurinol. She apparently had one other mild flare that subsided on its own in the past.  Check glucose before prescribed prednisone.

## 2012-04-03 ENCOUNTER — Ambulatory Visit (INDEPENDENT_AMBULATORY_CARE_PROVIDER_SITE_OTHER): Payer: Medicare Other | Admitting: Family Medicine

## 2012-04-03 VITALS — BP 154/76 | HR 56 | Temp 99.1°F | Resp 14 | Ht 67.0 in | Wt 215.6 lb

## 2012-04-03 DIAGNOSIS — M79671 Pain in right foot: Secondary | ICD-10-CM

## 2012-04-03 DIAGNOSIS — M79672 Pain in left foot: Secondary | ICD-10-CM

## 2012-04-03 DIAGNOSIS — M109 Gout, unspecified: Secondary | ICD-10-CM

## 2012-04-03 DIAGNOSIS — M79609 Pain in unspecified limb: Secondary | ICD-10-CM

## 2012-04-03 MED ORDER — INDOMETHACIN 50 MG PO CAPS: 50.0000 mg | ORAL_CAPSULE | Freq: Three times a day (TID) | ORAL | Status: AC

## 2012-04-03 NOTE — Patient Instructions (Addendum)
Purine Restricted Diet  A low-purine diet consists of foods that reduce uric acid made in your body.  INDICATIONS FOR USE    Your caregiver may ask you to follow a low-purine diet to reduce gout flairs.    GUIDELINES    Avoid high-purine foods, including all alcohol, yeast extracts taken as supplements, and sauces made from meats (like gravy). Do not eat high-purine meats, including anchovies, sardines, herring, mussels, tuna, codfish, scallops, trout, haddock, bacon, organ meats, tripe, goose, wild game, and sweetbreads.    Grains   Allowed/Recommended: All, except those listed to consume in moderation.    Consume in Moderation: Oatmeal (? cup uncooked daily), wheat bran or germ ( cup daily), and whole grains.   Vegetables   Allowed/Recommended: All, except those listed to consume in moderation.    Consume in Moderation: Asparagus, cauliflower, spinach, mushrooms, and green peas ( cup daily).   Fruit   Allowed/Recommended: All.    Consume in Moderation: None.   Meat and Meat Substitutes   Allowed/Recommended: Eggs, nuts, and peanut butter.    Consume in Moderation: Limit to 4 to 6 oz daily. Avoid high-purine meats. Lentils, peas, and dried beans (1 cup daily).   Milk   Allowed/Recommended: All. Choose low-fat or skim when possible.    Consume in Moderation: None.   Fats and Oils   Allowed/Recommended: All.    Consume in Moderation: None.   Beverages   Allowed/Recommended: All, except those listed to avoid.    Avoid: All alcohol.   Condiments/Miscellaneous   Allowed/Recommended: All, except those listed to consume in moderation.    Consume in Moderation: Bouillon and meat-based broths and soups.   Document Released: 11/26/2010 Document Revised: 07/21/2011 Document Reviewed: 11/26/2010  ExitCare Patient Information 2012 [employer].    Gout   Gout is an inflammatory condition (arthritis) caused by a buildup of uric acid crystals in the joints. Uric acid is a chemical that is normally present in the blood. Under some circumstances, uric acid can form into crystals in your joints. This causes joint redness, soreness, and swelling (inflammation). Repeat attacks are common. Over time, uric acid crystals can form into masses (tophi) near a joint, causing disfigurement. Gout is treatable and often preventable.  CAUSES    The disease begins with elevated levels of uric acid in the blood. Uric acid is produced by your body when it breaks down a naturally found substance called purines. This also happens when you eat certain foods such as meats and fish. Causes of an elevated uric acid level include:   Being passed down from parent to child (heredity).    Diseases that cause increased uric acid production (obesity, psoriasis, some cancers).    Excessive alcohol use.    Diet, especially diets rich in meat and seafood.    Medicines, including certain cancer-fighting drugs (chemotherapy), diuretics, and aspirin.    Chronic kidney disease. The kidneys are no longer able to remove uric acid well.    Problems with metabolism.   Conditions strongly associated with gout include:   Obesity.    High blood pressure.    High cholesterol.    Diabetes.   Not everyone with elevated uric acid levels gets gout. It is not understood why some people get gout and others do not. Surgery, joint injury, and eating too much of certain foods are some of the factors that can lead to gout.  SYMPTOMS     An attack of gout comes on quickly. It causes   intense pain with redness, swelling, and warmth in a joint.    Fever can occur.    Often, only one joint is involved. Certain joints are more commonly involved:    Base of the big toe.    Knee.    Ankle.    Wrist.    Finger.    Without treatment, an attack usually goes away in a few days to weeks. Between attacks, you usually will not have symptoms, which is different from many other forms of arthritis.  DIAGNOSIS    Your caregiver will suspect gout based on your symptoms and exam. Removal of fluid from the joint (arthrocentesis) is done to check for uric acid crystals. Your caregiver will give you a medicine that numbs the area (local anesthetic) and use a needle to remove joint fluid for exam. Gout is confirmed when uric acid crystals are seen in joint fluid, using a special microscope. Sometimes, blood, urine, and X-ray tests are also used.  TREATMENT    There are 2 phases to gout treatment: treating the sudden onset (acute) attack and preventing attacks (prophylaxis).  Treatment of an Acute Attack   Medicines are used. These include anti-inflammatory medicines or steroid medicines.    An injection of steroid medicine into the affected joint is sometimes necessary.    The painful joint is rested. Movement can worsen the arthritis.    You may use warm or cold treatments on painful joints, depending which works best for you.    Discuss the use of coffee, vitamin C, or cherries with your caregiver. These may be helpful treatment options.   Treatment to Prevent Attacks  After the acute attack subsides, your caregiver may advise prophylactic medicine. These medicines either help your kidneys eliminate uric acid from your body or decrease your uric acid production. You may need to stay on these medicines for a very long time.  The early phase of treatment with prophylactic medicine can be associated with an increase in acute gout attacks. For this reason, during the first few months of treatment, your caregiver may also advise you to take medicines usually used for acute gout treatment. Be sure you understand your caregiver's directions.   You should also discuss dietary treatment with your caregiver. Certain foods such as meats and fish can increase uric acid levels. Other foods such as dairy can decrease levels. Your caregiver can give you a list of foods to avoid.  HOME CARE INSTRUCTIONS     Do not take aspirin to relieve pain. This raises uric acid levels.    Only take over-the-counter or prescription medicines for pain, discomfort, or fever as directed by your caregiver.    Rest the joint as much as possible. When in bed, keep sheets and blankets off painful areas.    Keep the affected joint raised (elevated).    Use crutches if the painful joint is in your leg.    Drink enough water and fluids to keep your urine clear or pale yellow. This helps your body get rid of uric acid. Do not drink alcoholic beverages. They slow the passage of uric acid.    Follow your caregiver's dietary instructions. Pay careful attention to the amount of protein you eat. Your daily diet should emphasize fruits, vegetables, whole grains, and fat-free or low-fat milk products.    Maintain a healthy body weight.   SEEK MEDICAL CARE IF:     You have an oral temperature above 102 F (38.9 C).    You   develop diarrhea, vomiting, or any side effects from medicines.    You do not feel better in 24 hours, or you are getting worse.   SEEK IMMEDIATE MEDICAL CARE IF:     Your joint becomes suddenly more tender and you have:    Chills.    An oral temperature above 102 F (38.9 C), not controlled by medicine.   MAKE SURE YOU:     Understand these instructions.    Will watch your condition.    Will get help right away if you are not doing well or get worse.   Document Released: 07/29/2000 Document Revised: 07/21/2011 Document Reviewed: 11/09/2009  Exit[employer] Patient Information 2012 [employer].

## 2012-04-03 NOTE — Progress Notes (Signed)
[facility]:  Office Visit  Chief Complaint:  Chief Complaint  Patient presents with  . Neck Pain  . Foot Pain    left foot      HPI: [name] [name] is a 66 y.o. female who complains of  Left foot pain, gouty flareup-eating pork and beans. Has had this and treated with steroids and seemed to improve but not completly resolved.  Would like to try something to completely resolve gout that is affordable  Past Medical History  Diagnosis Date  . Hypertension    No past surgical history on file. History   Social History  . Marital Status: Widowed    Spouse Name: N/A    Number of Children: N/A  . Years of Education: N/A   Social History Main Topics  . Smoking status: Current Everyday Smoker -- 1.0 packs/day for 32 years  . Smokeless tobacco: None  . Alcohol Use: No  . Drug Use: No  . Sexually Active: None   Other Topics Concern  . None   Social History Narrative  . None   Family History  Problem Relation Age of Onset  . Cancer Mother   . Heart disease Father    No Known Allergies Prior to Admission medications   Medication Sig Start Date End Date Taking? Authorizing Provider  atenolol-chlorthalidone (TENORETIC) 100-25 MG per tablet Take 1 tablet by mouth daily.   Yes Historical Provider, MD  cetirizine (ZYRTEC) 10 MG tablet Take 10 mg by mouth as needed.    Yes Historical Provider, MD  indomethacin (INDOCIN) 50 MG capsule Take 1 capsule (50 mg total) by mouth 3 (three) times daily with meals. 04/03/12 07/02/12  Indria Bishara P Kenniel Bergsma, DO     ROS: The patient denies fevers, chills, night sweats, unintentional weight loss, chest pain, palpitations, wheezing, dyspnea on exertion, nausea, vomiting, abdominal pain, dysuria, hematuria, melena, numbness, weakness, or tingling.       All other systems have been reviewed and were otherwise negative with the exception of those mentioned in the HPI and as above.    PHYSICAL EXAM: Filed Vitals:   04/03/12 1504  BP:  154/76  Pulse: 56  Temp: 99.1 F (37.3 C)  Resp: 14   Filed Vitals:   04/03/12 1504  Height: 5\' 7"  (1.702 m)  Weight: 215 lb 9.6 oz (97.796 kg)   Body mass index is 33.77 kg/(m^2).  General: Alert, no acute distress HEENT:  Normocephalic, atraumatic, oropharynx patent.  Cardiovascular:  Regular rate and rhythm, no rubs murmurs or gallops.  No Carotid bruits, radial pulse intact. No pedal edema.  Respiratory: Clear to auscultation bilaterally.  No wheezes, rales, or rhonchi.  No cyanosis, no use of accessory musculature GI: No organomegaly, abdomen is soft and non-tender, positive bowel sounds.  No masses. Skin: No rashes. Neurologic: Facial musculature symmetric. Psychiatric: Patient is appropriate throughout our interaction. Lymphatic: No cervical lymphadenopathy Musculoskeletal: Gait limp. Left laterl foot mild swelling and tenderness   LABS: Results for orders placed in visit on 03/17/12  GLUCOSE, POCT (MANUAL RESULT ENTRY)      Component Value Range   POC Glucose 102 (*) 70 - 99 mg/dl     EKG/XRAY:   Primary read interpreted by Dr. Conley Rolls at [facility].   ASSESSMENT/PLAN: Encounter Diagnoses  Name Primary?  Marland Kitchen Foot pain, bilateral Yes  . Gout     Rx Indomethacin 50 mg TID with food. Consider colchicine if no improvement.  Consider  rx allopurinol once out of  acute flareup. She may want to get her uric acid rechecked.  Need to consider whether her thiazide diuretic is also a factor in gouty flares. D/w patient that this needs to be further evaluated if she continues to get flare ups. She has had 1, maybe 2 episode so far. Would like to hold off on any start/discontinuation of meds until establish if this is a consistent pattern Advise to avoid gout inducing foods    Darrly Loberg PHUONG, DO 04/03/2012 4:52 PM

## 2012-05-04 ENCOUNTER — Other Ambulatory Visit: Payer: Self-pay | Admitting: Internal Medicine

## 2012-05-04 DIAGNOSIS — Z1231 Encounter for screening mammogram for malignant neoplasm of breast: Secondary | ICD-10-CM

## 2012-05-21 ENCOUNTER — Ambulatory Visit: Payer: Medicare Other

## 2012-05-25 ENCOUNTER — Ambulatory Visit: Payer: Medicare Other

## 2012-07-31 ENCOUNTER — Ambulatory Visit
Admission: RE | Admit: 2012-07-31 | Discharge: 2012-07-31 | Disposition: A | Discharge status: Home / Self Care | Payer: Medicare Other | Source: Ambulatory Visit | Attending: Internal Medicine | Admitting: Internal Medicine

## 2012-07-31 DIAGNOSIS — Z1231 Encounter for screening mammogram for malignant neoplasm of breast: Secondary | ICD-10-CM

## 2012-10-25 ENCOUNTER — Ambulatory Visit (INDEPENDENT_AMBULATORY_CARE_PROVIDER_SITE_OTHER): Payer: Medicare Other | Admitting: Emergency Medicine

## 2012-10-25 VITALS — BP 171/90 | HR 51 | Temp 97.7°F | Resp 17 | Ht 68.0 in | Wt 216.0 lb

## 2012-10-25 DIAGNOSIS — M25569 Pain in unspecified knee: Secondary | ICD-10-CM

## 2012-10-25 DIAGNOSIS — M25561 Pain in right knee: Secondary | ICD-10-CM

## 2012-10-25 MED ORDER — NAPROXEN 500 MG PO TABS: 500.0000 mg | ORAL_TABLET | Freq: Two times a day (BID) | ORAL | Status: DC
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
## 2012-10-25 NOTE — Patient Instructions (Addendum)
Knee Pain The knee is the complex joint between your thigh and your lower leg. It is made up of bones, tendons, ligaments, and cartilage. The bones that make up the knee are:  The femur in the thigh.  The tibia and fibula in the lower leg.  The patella or kneecap riding in the groove on the lower femur. CAUSES  Knee pain is a common complaint with many causes. A few of these causes are:  Injury, such as:  A ruptured ligament or tendon injury.  Torn cartilage.  Medical conditions, such as:  Gout  Arthritis  Infections  Overuse, over training or overdoing a physical activity. Knee pain can be minor or severe. Knee pain can accompany debilitating injury. Minor knee problems often respond well to self-care measures or get well on their own. More serious injuries may need medical intervention or even surgery. SYMPTOMS The knee is complex. Symptoms of knee problems can vary widely. Some of the problems are:  Pain with movement and weight bearing.  Swelling and tenderness.  Buckling of the knee.  Inability to straighten or extend your knee.  Your knee locks and you cannot straighten it.  Warmth and redness with pain and fever.  Deformity or dislocation of the kneecap. DIAGNOSIS  Determining what is wrong may be very straight forward such as when there is an injury. It can also be challenging because of the complexity of the knee. Tests to make a diagnosis may include:  Your caregiver taking a history and doing a physical exam.  Routine X-rays can be used to rule out other problems. X-rays will not reveal a cartilage tear. Some injuries of the knee can be diagnosed by:  Arthroscopy a surgical technique by which a small video camera is inserted through tiny incisions on the sides of the knee. This procedure is used to examine and repair internal knee joint problems. Tiny instruments can be used during arthroscopy to repair the torn knee cartilage (meniscus).  Arthrography  is a radiology technique. A contrast liquid is directly injected into the knee joint. Internal structures of the knee joint then become visible on X-ray film.  An MRI scan is a non x-ray radiology procedure in which magnetic fields and a computer produce two- or three-dimensional images of the inside of the knee. Cartilage tears are often visible using an MRI scanner. MRI scans have largely replaced arthrography in diagnosing cartilage tears of the knee.  Blood work.  Examination of the fluid that helps to lubricate the knee joint (synovial fluid). This is done by taking a sample out using a needle and a syringe. TREATMENT The treatment of knee problems depends on the cause. Some of these treatments are:  Depending on the injury, proper casting, splinting, surgery or physical therapy care will be needed.  Give yourself adequate recovery time. Do not overuse your joints. If you begin to get sore during workout routines, back off. Slow down or do fewer repetitions.  For repetitive activities such as cycling or running, maintain your strength and nutrition.  Alternate muscle groups. For example if you are a weight lifter, work the upper body on one day and the lower body the next.  Either tight or weak muscles do not give the proper support for your knee. Tight or weak muscles do not absorb the stress placed on the knee joint. Keep the muscles surrounding the knee strong.  Take care of mechanical problems.  If you have flat feet, orthotics or special shoes may help.   See your caregiver if you need help.  Arch supports, sometimes with wedges on the inner or outer aspect of the heel, can help. These can shift pressure away from the side of the knee most bothered by osteoarthritis.  A brace called an "unloader" brace also may be used to help ease the pressure on the most arthritic side of the knee.  If your caregiver has prescribed crutches, braces, wraps or ice, use as directed. The acronym for  this is PRICE. This means protection, rest, ice, compression and elevation.  Nonsteroidal anti-inflammatory drugs (NSAID's), can help relieve pain. But if taken immediately after an injury, they may actually increase swelling. Take NSAID's with food in your stomach. Stop them if you develop stomach problems. Do not take these if you have a history of ulcers, stomach pain or bleeding from the bowel. Do not take without your caregiver's approval if you have problems with fluid retention, heart failure, or kidney problems.  For ongoing knee problems, physical therapy may be helpful.  Glucosamine and chondroitin are over-the-counter dietary supplements. Both may help relieve the pain of osteoarthritis in the knee. These medicines are different from the usual anti-inflammatory drugs. Glucosamine may decrease the rate of cartilage destruction.  Injections of a corticosteroid drug into your knee joint may help reduce the symptoms of an arthritis flare-up. They may provide pain relief that lasts a few months. You may have to wait a few months between injections. The injections do have a small increased risk of infection, water retention and elevated blood sugar levels.  Hyaluronic acid injected into damaged joints may ease pain and provide lubrication. These injections may work by reducing inflammation. A series of shots may give relief for as long as 6 months.  Topical painkillers. Applying certain ointments to your skin may help relieve the pain and stiffness of osteoarthritis. Ask your pharmacist for suggestions. Many over the-counter products are approved for temporary relief of arthritis pain.  In some countries, doctors often prescribe topical NSAID's for relief of chronic conditions such as arthritis and tendinitis. A review of treatment with NSAID creams found that they worked as well as oral medications but without the serious side effects. PREVENTION  Maintain a healthy weight. Extra pounds put  more strain on your joints.  Get strong, stay limber. Weak muscles are a common cause of knee injuries. Stretching is important. Include flexibility exercises in your workouts.  Be smart about exercise. If you have osteoarthritis, chronic knee pain or recurring injuries, you may need to change the way you exercise. This does not mean you have to stop being active. If your knees ache after jogging or playing basketball, consider switching to swimming, water aerobics or other low-impact activities, at least for a few days a week. Sometimes limiting high-impact activities will provide relief.  Make sure your shoes fit well. Choose footwear that is right for your sport.  Protect your knees. Use the proper gear for knee-sensitive activities. Use kneepads when playing volleyball or laying carpet. Buckle your seat belt every time you drive. Most shattered kneecaps occur in car accidents.  Rest when you are tired. SEEK MEDICAL CARE IF:  You have knee pain that is continual and does not seem to be getting better.  SEEK IMMEDIATE MEDICAL CARE IF:  Your knee joint feels hot to the touch and you have a high fever. MAKE SURE YOU:   Understand these instructions.  Will watch your condition.  Will get help right away if you are not   doing well or get worse. Document Released: 05/29/2007 Document Revised: 10/24/2011 Document Reviewed: 05/29/2007 [employer] Patient Information 2013 [employer].  

## 2013-02-09 ENCOUNTER — Encounter (HOSPITAL_COMMUNITY): Payer: Self-pay | Admitting: Emergency Medicine

## 2013-02-09 ENCOUNTER — Emergency Department (HOSPITAL_COMMUNITY)
Admission: EM | Admit: 2013-02-09 | Discharge: 2013-02-09 | Disposition: A | Discharge status: Home / Self Care | Payer: Medicare Other | Attending: Emergency Medicine | Admitting: Emergency Medicine

## 2013-02-09 DIAGNOSIS — I10 Essential (primary) hypertension: Secondary | ICD-10-CM | POA: Insufficient documentation

## 2013-02-09 DIAGNOSIS — M25579 Pain in unspecified ankle and joints of unspecified foot: Secondary | ICD-10-CM | POA: Insufficient documentation

## 2013-02-09 DIAGNOSIS — M109 Gout, unspecified: Secondary | ICD-10-CM

## 2013-02-09 DIAGNOSIS — F17200 Nicotine dependence, unspecified, uncomplicated: Secondary | ICD-10-CM | POA: Insufficient documentation

## 2013-02-09 DIAGNOSIS — M79671 Pain in right foot: Secondary | ICD-10-CM

## 2013-02-09 DIAGNOSIS — Z79899 Other long term (current) drug therapy: Secondary | ICD-10-CM | POA: Insufficient documentation

## 2013-02-09 MED ORDER — PREDNISONE 20 MG PO TABS: ORAL_TABLET | ORAL | Status: DC

## 2013-02-09 MED ORDER — HYDROCODONE-ACETAMINOPHEN 5-325 MG PO TABS: 1.0000 | ORAL_TABLET | Freq: Four times a day (QID) | ORAL | Status: DC | PRN

## 2013-02-09 MED ORDER — PREDNISONE 20 MG PO TABS
60.0000 mg | ORAL_TABLET | Freq: Once | ORAL | Status: AC
  Administered 2013-02-09: 60 mg via ORAL
  Filled 2013-02-09: qty 3

## 2013-02-09 NOTE — ED Notes (Signed)
Pt states that on Thursday her foot started hurting and the knot on the top of her right foot then foot has been swelling ever since. Pt states she does have PMh gout.  Pt states she is currently taking liquid potassium and when she starts to feel better something like this happens.  Pt states around Christmas she had her foot swollen and was given prednisone and the swelling went down.

## 2013-02-09 NOTE — ED Provider Notes (Signed)
History    CSN: [id number] Arrival date & time 02/09/13  1239  First MD Initiated Contact with Patient 02/09/13 1246     Chief Complaint  Patient presents with  . Foot Swelling    right   (Consider location/radiation/quality/duration/timing/severity/associated sxs/prior Treatment) The history is provided by the patient.  pt c/o right goot pain and swelling for the past 3 days. Gradual onset. Constant. Dull. Moderate. Worse w palpation, movement and walking on or wearing shoes. Similar to prior episodes gout. Denies any recent medication or dietary change. No fever or chills. Denies any bite or sting to area. No trauma, twisting injury or fall. No numbness/weaness. Skin intact,.    Past Medical History  Diagnosis Date  . Hypertension    History reviewed. No pertinent past surgical history. Family History  Problem Relation Age of Onset  . Cancer Mother   . Heart disease Father    History  Substance Use Topics  . Smoking status: Current Every Day Smoker -- 1.00 packs/day for 32 years    Types: Cigarettes  . Smokeless tobacco: Not on file  . Alcohol Use: No   OB History   Grav Para Term Preterm Abortions TAB SAB Ect Mult Living                 Review of Systems  Constitutional: Negative for fever and chills.  Respiratory: Negative for shortness of breath.   Cardiovascular: Negative for chest pain and leg swelling.  Gastrointestinal: Negative for nausea and vomiting.  Skin: Negative for wound.  Neurological: Negative for weakness and numbness.    Allergies  Review of patient's allergies indicates no known allergies.  Home Medications   Current Outpatient Rx  Name  Route  Sig  Dispense  Refill  . atenolol-chlorthalidone (TENORETIC) 100-25 MG per tablet   Oral   Take 1 tablet by mouth daily.         . cetirizine (ZYRTEC) 10 MG tablet   Oral   Take 10 mg by mouth as needed.          . naproxen (NAPROSYN) 500 MG tablet   Oral   Take 1 tablet (500 mg  total) by mouth 2 (two) times daily with a meal.   30 tablet   0   . potassium chloride 40 MEQ/15ML (20%) LIQD   Oral   Take by mouth.          BP 195/98  Pulse 72  Temp(Src) 98 F (36.7 C) (Oral)  Resp 19  SpO2 99% Physical Exam  Nursing note and vitals reviewed. Constitutional: She appears well-developed and well-nourished. No distress.  Eyes: Conjunctivae are normal. No scleral icterus.  Neck: Neck supple. No tracheal deviation present.  Cardiovascular: Normal rate.   Pulmonary/Chest: Effort normal. No respiratory distress.  Abdominal: Normal appearance.  Musculoskeletal: Normal range of motion. She exhibits tenderness.  Soft tissue swelling and tenderness dorsum right foot from 1st mcp region extending proximally to mid foot. Mild inc warmth. No cellulitis. Skin intact. Distal pulses palp. Normal cap refill distally. No ankle or calf swelling or tenderness. No lymphangitis or cellulitis.   Neurological: She is alert.  Skin: Skin is warm and dry. No rash noted.  Psychiatric: She has a normal mood and affect.    ED Course  Procedures (including critical care time)   MDM  Pt denies any trauma, fall or twisting injury. Similar symptoms to prior gout.  Pt drove self to ED.  Prednisone po.  Will give  pred taper and rx vicodin for pain.  pcp f/u re gout, as well as for htn.     Suzi Roots, MD 02/09/13 1308

## 2013-02-13 ENCOUNTER — Encounter (INDEPENDENT_AMBULATORY_CARE_PROVIDER_SITE_OTHER): Payer: Medicare Other

## 2013-02-13 DIAGNOSIS — I70219 Atherosclerosis of native arteries of extremities with intermittent claudication, unspecified extremity: Secondary | ICD-10-CM

## 2013-02-13 DIAGNOSIS — I739 Peripheral vascular disease, unspecified: Secondary | ICD-10-CM

## 2013-12-25 ENCOUNTER — Ambulatory Visit: Payer: Medicare HMO

## 2013-12-25 ENCOUNTER — Ambulatory Visit (INDEPENDENT_AMBULATORY_CARE_PROVIDER_SITE_OTHER): Payer: Medicare HMO | Admitting: Family Medicine

## 2013-12-25 VITALS — BP 152/50 | HR 58 | Temp 97.9°F | Resp 16 | Ht 66.0 in | Wt 211.8 lb

## 2013-12-25 DIAGNOSIS — R300 Dysuria: Secondary | ICD-10-CM

## 2013-12-25 DIAGNOSIS — N898 Other specified noninflammatory disorders of vagina: Secondary | ICD-10-CM

## 2013-12-25 DIAGNOSIS — M25552 Pain in left hip: Secondary | ICD-10-CM

## 2013-12-25 DIAGNOSIS — L293 Anogenital pruritus, unspecified: Secondary | ICD-10-CM

## 2013-12-25 DIAGNOSIS — N390 Urinary tract infection, site not specified: Secondary | ICD-10-CM

## 2013-12-25 DIAGNOSIS — M25559 Pain in unspecified hip: Secondary | ICD-10-CM

## 2013-12-25 LAB — POCT WET PREP WITH KOH
Clue Cells Wet Prep HPF POC: NEGATIVE
KOH Prep POC: NEGATIVE
RBC Wet Prep HPF POC: NEGATIVE
Trichomonas, UA: NEGATIVE
Yeast Wet Prep HPF POC: NEGATIVE

## 2013-12-25 LAB — POCT URINALYSIS DIPSTICK
Blood, UA: NEGATIVE
Glucose, UA: NEGATIVE
Ketones, UA: NEGATIVE
Nitrite, UA: NEGATIVE
Protein, UA: NEGATIVE
Spec Grav, UA: 1.015
Urobilinogen, UA: 0.2
pH, UA: 6

## 2013-12-25 LAB — POCT UA - MICROSCOPIC ONLY
Casts, Ur, LPF, POC: NEGATIVE
Crystals, Ur, HPF, POC: NEGATIVE
Mucus, UA: NEGATIVE
Yeast, UA: NEGATIVE

## 2013-12-25 MED ORDER — NITROFURANTOIN MONOHYD MACRO 100 MG PO CAPS: 100.0000 mg | ORAL_CAPSULE | Freq: Two times a day (BID) | ORAL | Status: DC

## 2013-12-25 MED ORDER — FLUCONAZOLE 150 MG PO TABS: 150.0000 mg | ORAL_TABLET | Freq: Every day | ORAL | Status: DC

## 2013-12-25 MED ORDER — PHENAZOPYRIDINE HCL 200 MG PO TABS: 200.0000 mg | ORAL_TABLET | Freq: Three times a day (TID) | ORAL | Status: DC | PRN

## 2013-12-25 NOTE — Progress Notes (Signed)
Chief Complaint:  Chief Complaint  Patient presents with  . Vaginal Itching    vaginal burning  . Hip Pain    left hip    HPI: [name] [name] is a 68 y.o. female who is here for : 2 day history of yeast infection, dysuria and also tried monistat suppository without relief  1 month history of left hip pain, then radiated to groin, she was trying to walk it off but it still hurts and she wants to know if she has something wrong with her hip.  NKI, or prior issues with hip She does ostearthritis Sh ehas pain when shie is sitting cross legged most of the time, she tried not to do that Deneis numbness weakness or tingling  Findings: Calcified uterine fibroid. Fibroid measures approximate  4.5 cm in diameter. Pelvic sidewalls are defined. No free pelvic  fluid. No definite rectosigmoid abnormality.  IMPRESSION:  Uterine fibroid.    Past Medical History  Diagnosis Date  . Hypertension    No past surgical history on file. History   Social History  . Marital Status: Widowed    Spouse Name: N/A    Number of Children: N/A  . Years of Education: N/A   Social History Main Topics  . Smoking status: Current Every Day Smoker -- 1.00 packs/day for 32 years    Types: Cigarettes  . Smokeless tobacco: None  . Alcohol Use: No  . Drug Use: No  . Sexual Activity: None   Other Topics Concern  . None   Social History Narrative  . None   Family History  Problem Relation Age of Onset  . Cancer Mother   . Heart disease Father    No Known Allergies Prior to Admission medications   Medication Sig Start Date End Date Taking? Authorizing Provider  atenolol-chlorthalidone (TENORETIC) 100-25 MG per tablet Take 1 tablet by mouth every morning.    Yes Historical Provider, MD  HYDROcodone-acetaminophen (NORCO/VICODIN) 5-325 MG per tablet Take 1-2 tablets by mouth every 6 (six) hours as needed for pain. 02/09/13  Yes Mirna Mires, MD  ibuprofen (ADVIL,MOTRIN) 200 MG tablet Take 200 mg  by mouth every 4 (four) hours as needed for pain.   Yes Historical Provider, MD  potassium chloride 40 MEQ/15ML (20%) LIQD Take 13.3 mEq by mouth 2 (two) times daily.    Yes Historical Provider, MD  predniSONE (DELTASONE) 20 MG tablet 3 po once a day for 2 days, then 2 po once a day for 3 days, then 1 po once a day for 3 days 02/09/13  Yes Mirna Mires, MD     ROS: The patient denies fevers, chills, night sweats, unintentional weight loss, chest pain, palpitations, wheezing, dyspnea on exertion, nausea, vomiting, abdominal pain, , hematuria, melena, numbness, weakness, or tingling.  All other systems have been reviewed and were otherwise negative with the exception of those mentioned in the HPI and as above.    PHYSICAL EXAM: Filed Vitals:   12/25/13 1208  BP: 152/50  Pulse: 58  Temp: 97.9 F (36.6 C)  Resp: 16   Filed Vitals:   12/25/13 1208  Height: 5\' 6"  (1.676 m)  Weight: 211 lb 12.8 oz (96.072 kg)   Body mass index is 34.2 kg/(m^2).  General: Alert, no acute distress HEENT:  Normocephalic, atraumatic, oropharynx patent. EOMI, PERRLA Cardiovascular:  Regular rate and rhythm, no rubs murmurs or gallops.  No Carotid bruits, radial pulse intact. No pedal edema.  Respiratory:  Clear to auscultation bilaterally.  No wheezes, rales, or rhonchi.  No cyanosis, no use of accessory musculature GI: No organomegaly, abdomen is soft and non-tender, positive bowel sounds.  No masses. Skin: No rashes. Neurologic: Facial musculature symmetric. Psychiatric: Patient is appropriate throughout our interaction. Lymphatic: No cervical lymphadenopathy Musculoskeletal: Gait intact. Full IR/ER GU-+ very difficut to do a speculum exam, unable to do it  + irritated, dry, no vaginal dcappreciated   LABS: Results for orders placed in visit on 12/25/13  POCT UA - MICROSCOPIC ONLY      Result Value Ref Range   WBC, Ur, HPF, POC 0-1     RBC, urine, microscopic 0-1     Bacteria, U Microscopic trace      Mucus, UA neg     Epithelial cells, urine per micros 0-3     Crystals, Ur, HPF, POC neg     Casts, Ur, LPF, POC neg     Yeast, UA neg    POCT URINALYSIS DIPSTICK      Result Value Ref Range   Color, UA yellow     Clarity, UA clear     Glucose, UA neg     Bilirubin, UA ng     Ketones, UA neg     Spec Grav, UA 1.015     Blood, UA neg     pH, UA 6.0     Protein, UA neg     Urobilinogen, UA 0.2     Nitrite, UA neg     Leukocytes, UA Trace    POCT WET PREP WITH KOH      Result Value Ref Range   Trichomonas, UA Negative     Clue Cells Wet Prep HPF POC neg     Epithelial Wet Prep HPF POC 0-4     Yeast Wet Prep HPF POC neg     Bacteria Wet Prep HPF POC trace     RBC Wet Prep HPF POC neg     WBC Wet Prep HPF POC 0-1     KOH Prep POC Negative       EKG/XRAY:   Primary read interpreted by Dr. Marin Comment at [facility]. Mild djd , neg fx/dislocation Calcified fibroid    ASSESSMENT/PLAN: Encounter Diagnoses  Name Primary?  . Dysuria Yes  . Left hip pain   . Vaginal itching   . UTI (urinary tract infection)    69 y/o female with UTI and very dry and irritated vaginal vault. She was guarding so much due to irrtiation aroudn vaginal area that  I was unable to put in a speculum and/or do a pevic exam She did self swab insertion RX macrobid Rx pyridium Rx diflucan Urine cx pending   Gross sideeffects, risk and benefits, and alternatives of medications d/w patient. Patient is aware that all medications have potential sideeffects and we are unable to predict every sideeffect or drug-drug interaction that may occur.  Glenford Bayley, DO 12/25/2013 2:53 PM

## 2013-12-26 ENCOUNTER — Encounter: Payer: Self-pay | Admitting: Family Medicine

## 2013-12-27 LAB — URINE CULTURE
Colony Count: NO GROWTH
Organism ID, Bacteria: NO GROWTH

## 2013-12-31 ENCOUNTER — Encounter: Payer: Self-pay | Admitting: *Deleted

## 2014-04-04 ENCOUNTER — Other Ambulatory Visit: Payer: Self-pay | Admitting: Internal Medicine

## 2014-04-04 DIAGNOSIS — Z1231 Encounter for screening mammogram for malignant neoplasm of breast: Secondary | ICD-10-CM

## 2014-04-10 ENCOUNTER — Ambulatory Visit
Admission: RE | Admit: 2014-04-10 | Discharge: 2014-04-10 | Disposition: A | Discharge status: Home / Self Care | Payer: Commercial Managed Care - HMO | Source: Ambulatory Visit | Attending: Internal Medicine | Admitting: Internal Medicine

## 2014-04-10 DIAGNOSIS — Z1231 Encounter for screening mammogram for malignant neoplasm of breast: Secondary | ICD-10-CM

## 2014-07-07 ENCOUNTER — Other Ambulatory Visit: Payer: Self-pay | Admitting: Internal Medicine

## 2014-07-07 DIAGNOSIS — R140 Abdominal distension (gaseous): Secondary | ICD-10-CM

## 2014-07-16 ENCOUNTER — Ambulatory Visit
Admission: RE | Admit: 2014-07-16 | Discharge: 2014-07-16 | Disposition: A | Discharge status: Home / Self Care | Payer: Commercial Managed Care - HMO | Source: Ambulatory Visit | Attending: Internal Medicine | Admitting: Internal Medicine

## 2014-07-16 DIAGNOSIS — R140 Abdominal distension (gaseous): Secondary | ICD-10-CM

## 2014-07-25 ENCOUNTER — Other Ambulatory Visit: Payer: Self-pay | Admitting: Obstetrics & Gynecology

## 2014-07-25 DIAGNOSIS — N838 Other noninflammatory disorders of ovary, fallopian tube and broad ligament: Secondary | ICD-10-CM

## 2014-08-01 ENCOUNTER — Inpatient Hospital Stay: Admission: RE | Admit: 2014-08-01 | Payer: Commercial Managed Care - HMO | Source: Ambulatory Visit

## 2014-09-18 ENCOUNTER — Ambulatory Visit (INDEPENDENT_AMBULATORY_CARE_PROVIDER_SITE_OTHER): Payer: Commercial Managed Care - HMO | Admitting: Family Medicine

## 2014-09-18 ENCOUNTER — Ambulatory Visit (INDEPENDENT_AMBULATORY_CARE_PROVIDER_SITE_OTHER): Payer: Commercial Managed Care - HMO

## 2014-09-18 VITALS — BP 148/78 | HR 75 | Temp 98.1°F | Resp 18 | Ht 67.25 in | Wt 215.0 lb

## 2014-09-18 DIAGNOSIS — S46212A Strain of muscle, fascia and tendon of other parts of biceps, left arm, initial encounter: Secondary | ICD-10-CM

## 2014-09-18 DIAGNOSIS — M25512 Pain in left shoulder: Secondary | ICD-10-CM

## 2014-09-18 DIAGNOSIS — S46112A Strain of muscle, fascia and tendon of long head of biceps, left arm, initial encounter: Secondary | ICD-10-CM

## 2014-09-18 DIAGNOSIS — Z8739 Personal history of other diseases of the musculoskeletal system and connective tissue: Secondary | ICD-10-CM

## 2014-09-18 DIAGNOSIS — I10 Essential (primary) hypertension: Secondary | ICD-10-CM

## 2014-09-18 DIAGNOSIS — Z8639 Personal history of other endocrine, nutritional and metabolic disease: Secondary | ICD-10-CM

## 2014-09-18 MED ORDER — MELOXICAM 7.5 MG PO TABS: 7.5000 mg | ORAL_TABLET | Freq: Every day | ORAL | Status: DC

## 2014-09-18 MED ORDER — METOPROLOL SUCCINATE ER 25 MG PO TB24: 25.0000 mg | ORAL_TABLET | Freq: Every day | ORAL | Status: DC

## 2014-09-18 NOTE — Patient Instructions (Signed)
Take metoprolol 1 daily for blood pressure.    Take meloxicam 1 daily for 1-2 weeks for the painful shoulder and arm    Sometime you should ask your doctor about taking allopurinol to keep your uric acid down to keep you from having gout flares.    Continue to follow up with your regular doctor or return here as needed for your blood pressure.

## 2014-09-18 NOTE — Progress Notes (Signed)
Subjective: 69 year old lady who raised herself up by pushing down with her arms about 2 weeks ago. She felt a pop in her left upper arm. She continues to hurt there and feels like the range of motion is not that good. She also is concerned about her blood pressure medicines. They no longer make Tenoretic, so her doctor. Change her to amlodipine and hydrochlorothiazide. She did not tolerate the amlodipine, but continues to take the hydrochlorothiazide 12.5 daily. She has been taking some kind of a supplement feeling that would help her blood pressure. No other acute medical complaints.  Objective: Good range of motion of her left shoulder. She can raise her arm fully. Strength seems adequate. She has a little tenderness in the biceps tendon groove and running on down the left upper arm. No evidence of tendon rupture. Good muscle strength at elbows and shoulders.  Assessment: Left shoulder pain Hypertension  Plan: Patient to metoprolol and continue the hydrochlorothiazide  X-ray left shoulder  [facility] reading (PRIMARY) by  Dr. Linna Darner  normal shoulder x-ray.

## 2014-10-09 ENCOUNTER — Ambulatory Visit (INDEPENDENT_AMBULATORY_CARE_PROVIDER_SITE_OTHER): Payer: Commercial Managed Care - HMO | Admitting: Family Medicine

## 2014-10-09 ENCOUNTER — Ambulatory Visit (INDEPENDENT_AMBULATORY_CARE_PROVIDER_SITE_OTHER): Payer: Commercial Managed Care - HMO

## 2014-10-09 VITALS — BP 118/78 | HR 86 | Temp 97.5°F | Resp 19 | Ht 67.0 in | Wt 214.2 lb

## 2014-10-09 DIAGNOSIS — R002 Palpitations: Secondary | ICD-10-CM

## 2014-10-09 DIAGNOSIS — Z720 Tobacco use: Secondary | ICD-10-CM

## 2014-10-09 DIAGNOSIS — R05 Cough: Secondary | ICD-10-CM

## 2014-10-09 DIAGNOSIS — F17200 Nicotine dependence, unspecified, uncomplicated: Secondary | ICD-10-CM

## 2014-10-09 DIAGNOSIS — S46212A Strain of muscle, fascia and tendon of other parts of biceps, left arm, initial encounter: Secondary | ICD-10-CM

## 2014-10-09 DIAGNOSIS — R059 Cough, unspecified: Secondary | ICD-10-CM

## 2014-10-09 DIAGNOSIS — H43391 Other vitreous opacities, right eye: Secondary | ICD-10-CM

## 2014-10-09 DIAGNOSIS — S46112A Strain of muscle, fascia and tendon of long head of biceps, left arm, initial encounter: Secondary | ICD-10-CM

## 2014-10-09 DIAGNOSIS — R0602 Shortness of breath: Secondary | ICD-10-CM

## 2014-10-09 DIAGNOSIS — I10 Essential (primary) hypertension: Secondary | ICD-10-CM

## 2014-10-09 DIAGNOSIS — J0120 Acute ethmoidal sinusitis, unspecified: Secondary | ICD-10-CM

## 2014-10-09 LAB — COMPREHENSIVE METABOLIC PANEL
ALT: 10 U/L (ref 0–35)
AST: 13 U/L (ref 0–37)
Albumin: 3.9 g/dL (ref 3.5–5.2)
Alkaline Phosphatase: 99 U/L (ref 39–117)
BUN: 12 mg/dL (ref 6–23)
CO2: 23 mEq/L (ref 19–32)
Calcium: 9.2 mg/dL (ref 8.4–10.5)
Chloride: 110 mEq/L (ref 96–112)
Creat: 1.01 mg/dL (ref 0.50–1.10)
Glucose, Bld: 97 mg/dL (ref 70–99)
Potassium: 3.4 mEq/L — ABNORMAL LOW (ref 3.5–5.3)
Sodium: 142 mEq/L (ref 135–145)
Total Bilirubin: 0.5 mg/dL (ref 0.2–1.2)
Total Protein: 6.7 g/dL (ref 6.0–8.3)

## 2014-10-09 LAB — POCT CBC
Granulocyte percent: 62.6 %G (ref 37–80)
HCT, POC: 46.5 % (ref 37.7–47.9)
Hemoglobin: 15.3 g/dL (ref 12.2–16.2)
Lymph, poc: 2.7 (ref 0.6–3.4)
MCH, POC: 29.3 pg (ref 27–31.2)
MCHC: 33 g/dL (ref 31.8–35.4)
MCV: 88.8 fL (ref 80–97)
MID (cbc): 0.6 (ref 0–0.9)
MPV: 9.6 fL (ref 0–99.8)
POC Granulocyte: 5.5 (ref 2–6.9)
POC LYMPH PERCENT: 30.2 %L (ref 10–50)
POC MID %: 7.2 %M (ref 0–12)
Platelet Count, POC: 121 10*3/uL — AB (ref 142–424)
RBC: 5.23 M/uL (ref 4.04–5.48)
RDW, POC: 16.5 %
WBC: 8.8 10*3/uL (ref 4.6–10.2)

## 2014-10-09 LAB — GLUCOSE, POCT (MANUAL RESULT ENTRY): POC Glucose: 95 mg/dl (ref 70–99)

## 2014-10-09 LAB — TSH: TSH: 1.194 u[IU]/mL (ref 0.350–4.500)

## 2014-10-09 MED ORDER — AMOXICILLIN 500 MG PO CAPS: 500.0000 mg | ORAL_CAPSULE | Freq: Two times a day (BID) | ORAL | Status: DC

## 2014-10-09 MED ORDER — CHLORTHALIDONE 25 MG PO TABS: 25.0000 mg | ORAL_TABLET | Freq: Every day | ORAL | Status: DC

## 2014-10-09 MED ORDER — MELOXICAM 7.5 MG PO TABS: 7.5000 mg | ORAL_TABLET | Freq: Every day | ORAL | Status: DC

## 2014-10-09 NOTE — Progress Notes (Signed)
Chief Complaint:  Chief Complaint  Patient presents with  . Medication Refills    Metoprolol, Meloxicam    HPI: [name] [name] is a 69 y.o. female who is here for 2-3 week history of chest congestion and head cold, she has had some SOB, diffuse HA, and sinus cough  She was told that they do not make tenoretic, so her doctor switched her to amlodipine and hctz 12.5 mg, she did not tolerate the amlodidpine so on the last time she was here she was switched to metoprolol 25 mg daily and was supposed to continuing taking HCTZ 12.5 mg and she was  doing ok on it. She lost the bottles and so was not sure which medicines she should request for a refill. She has had some congestion and sinus sxs, she has had some chest pressure and SOB with her cold but no wheezing. She has a hx of asthma which she takes medicines for on a regular basis.  No fevers or chills. She has had a nonproductive cough. NO numbness, weakness or tingling, or jaw pain or nausea/vomiting, diaphoresis.    She is a smoker, pulmonology told her she may have COPD, she has asthma, she has had SOB with rest and some exertion in last 4 weeks, she has no swelling of her legs. No weight gain, no orthopnea, she has ahd some intermittent palpitations. Deneis orthopnea, DeniesPND.  Has never seen a cardiologist.   She still has pain in her left biceps area, it has gotten better with mobic.   SpO2 Readings from Last 3 Encounters:  10/09/14 97%  09/18/14 98%  12/25/13 95%   BP Readings from Last 3 Encounters:  10/09/14 118/78  09/18/14 148/78  12/25/13 152/50   Wt Readings from Last 3 Encounters:  10/09/14 214 lb 3.2 oz (97.16 kg)  09/18/14 215 lb (97.523 kg)  12/25/13 211 lb 12.8 oz (96.072 kg)      Past Medical History  Diagnosis Date  . Hypertension   . Asthma   . Arthritis    History reviewed. No pertinent past surgical history. History   Social History  . Marital Status: Widowed    Spouse Name: N/A  . Number  of Children: N/A  . Years of Education: N/A   Social History Main Topics  . Smoking status: Current Some Day Smoker -- 1.00 packs/day for 32 years    Types: Cigarettes  . Smokeless tobacco: Not on file  . Alcohol Use: No  . Drug Use: No  . Sexual Activity: Not on file   Other Topics Concern  . None   Social History Narrative   Family History  Problem Relation Age of Onset  . Cancer Mother   . Heart disease Father    No Known Allergies Prior to Admission medications   Medication Sig Start Date End Date Taking? Authorizing Provider  meloxicam (MOBIC) 7.5 MG tablet Take 1 tablet (7.5 mg total) by mouth daily. 09/18/14  Yes Posey Boyer, MD  metoprolol succinate (TOPROL-XL) 25 MG 24 hr tablet Take 1 tablet (25 mg total) by mouth daily. 09/18/14  Yes Posey Boyer, MD  nitrofurantoin, macrocrystal-monohydrate, (MACROBID) 100 MG capsule Take 1 capsule (100 mg total) by mouth 2 (two) times daily. For UTI 12/25/13  Yes [place] Viereck P Caspian Deleonardis, DO  potassium chloride 40 MEQ/15ML (20%) LIQD Take 13.3 mEq by mouth 2 (two) times daily.    Yes Historical Provider, MD  HYDROcodone-acetaminophen (NORCO/VICODIN) 5-325 MG per tablet  Take 1-2 tablets by mouth every 6 (six) hours as needed for pain. Patient not taking: Reported on 09/18/2014 02/09/13   Mirna Mires, MD  ibuprofen (ADVIL,MOTRIN) 200 MG tablet Take 200 mg by mouth every 4 (four) hours as needed for pain.    Historical Provider, MD  phenazopyridine (PYRIDIUM) 200 MG tablet Take 1 tablet (200 mg total) by mouth 3 (three) times daily as needed for pain. For burning Patient not taking: Reported on 09/18/2014 12/25/13   Taetum Flewellen P Hasan Douse, DO     ROS: The patient denies fevers, chills, night sweats, unintentional weight loss, chest pain, palpitations, wheezing, nausea, vomiting, abdominal pain, dysuria, hematuria, melena, numbness, weakness, or tingling.   All other systems have been reviewed and were otherwise negative with the exception of those mentioned in the  HPI and as above.    PHYSICAL EXAM: Filed Vitals:   10/09/14 1342  BP: 118/78  Pulse: 86  Temp: 97.5 F (36.4 C)  Resp: 19   Filed Vitals:   10/09/14 1342  Height: 5\' 7"  (1.702 m)  Weight: 214 lb 3.2 oz (97.16 kg)   Body mass index is 33.54 kg/(m^2).  General: Alert, no acute distress HEENT:  Normocephalic, atraumatic, oropharynx patent. EOMI, PERRLA, fundo exam normal TM nl, erythematous throat, No exudates. + boggy nares, + sinus tenderness. Cardiovascular:  Regular rate and rhythm, no rubs murmurs or gallops.  No Carotid bruits, radial pulse intact. No pedal edema.  Respiratory: Clear to auscultation bilaterally.  No wheezes, rales, or rhonchi.  No cyanosis, no use of accessory musculature GI: No organomegaly, abdomen is soft and non-tender, positive bowel sounds.  No masses. Skin: No rashes. Neurologic: Facial musculature symmetric. Psychiatric: Patient is appropriate throughout our interaction. Lymphatic: No cervical lymphadenopathy Musculoskeletal: Gait intact.   LABS: Results for orders placed or performed in visit on 10/09/14  Comprehensive metabolic panel  Result Value Ref Range   Sodium 142 135 - 145 mEq/L   Potassium 3.4 (L) 3.5 - 5.3 mEq/L   Chloride 110 96 - 112 mEq/L   CO2 23 19 - 32 mEq/L   Glucose, Bld 97 70 - 99 mg/dL   BUN 12 6 - 23 mg/dL   Creat 1.01 0.50 - 1.10 mg/dL   Total Bilirubin 0.5 0.2 - 1.2 mg/dL   Alkaline Phosphatase 99 39 - 117 U/L   AST 13 0 - 37 U/L   ALT 10 0 - 35 U/L   Total Protein 6.7 6.0 - 8.3 g/dL   Albumin 3.9 3.5 - 5.2 g/dL   Calcium 9.2 8.4 - 10.5 mg/dL  TSH  Result Value Ref Range   TSH 1.194 0.350 - 4.500 uIU/mL  POCT CBC  Result Value Ref Range   WBC 8.8 4.6 - 10.2 K/uL   Lymph, poc 2.7 0.6 - 3.4   POC LYMPH PERCENT 30.2 10 - 50 %L   MID (cbc) 0.6 0 - 0.9   POC MID % 7.2 0 - 12 %M   POC Granulocyte 5.5 2 - 6.9   Granulocyte percent 62.6 37 - 80 %G   RBC 5.23 4.04 - 5.48 M/uL   Hemoglobin 15.3 12.2 - 16.2 g/dL     HCT, POC 46.5 37.7 - 47.9 %   MCV 88.8 80 - 97 fL   MCH, POC 29.3 27 - 31.2 pg   MCHC 33.0 31.8 - 35.4 g/dL   RDW, POC 16.5 %   Platelet Count, POC 121 (A) 142 - 424 K/uL   MPV 9.6  0 - 99.8 fL  POCT glucose (manual entry)  Result Value Ref Range   POC Glucose 95 70 - 99 mg/dl     EKG/XRAY:   Primary read interpreted by Dr. Marin Comment at [facility]. No acute cardiopulmonary process   ASSESSMENT/PLAN: Encounter Diagnoses  Name Primary?  . Floaters, right   . Cough   . SOB (shortness of breath)   . Palpitations   . Essential hypertension   . Acute ethmoidal sinusitis, recurrence not specified Yes  . Biceps strain, left, initial encounter    69 y/o female with PMH of HTN, arthritis and asthma/emphysema  who is here for URI sxs, CP and also SOB, palpitations. Chest xray was unremarkable, EKG showed nonspecific ST changes Rx Amoxacillin I dc her metoprolol and recommended increasing  her HCTZ from 12.5 mg to 25 mg due to her having asthma to avoid betablockers Refer to optho for floaters Refer to cardiology for palpitations SOB most likely related to asthma/COPD but since she has had palpitations and SOB prior to URI sxs then will refer to cardiology Labs pending: CMP , TSH Fu prn      Gross sideeffects, risk and benefits, and alternatives of medications d/w patient. Patient is aware that all medications have potential sideeffects and we are unable to predict every sideeffect or drug-drug interaction that may occur.  Janos Shampine, [place], DO 10/10/2014 3:04 PM

## 2014-10-10 ENCOUNTER — Telehealth: Payer: Self-pay

## 2014-10-10 MED ORDER — METOPROLOL SUCCINATE ER 25 MG PO TB24: 25.0000 mg | ORAL_TABLET | Freq: Every day | ORAL | Status: DC

## 2014-10-10 NOTE — Telephone Encounter (Signed)
Pt states was seen by dr Marin Comment on 10/09/14 and says the rx prescribed is giving her a headache. States per her conversation with dr Marin Comment yesterday, if the new rx was not working she would prescribe previous rx, metoprolol

## 2014-10-10 NOTE — Telephone Encounter (Signed)
Unable to leave message. I sent in the metoprolol, she is to continue HCTZ 12.5 mg which she was on before I changed it to 25 mg and also the metoprolol 25 mg daily. She can stop the HCTZ 25 mg dose.  If she continues to have problems even after he sinusitis is resolved then she should followup with me. She needs to monitor her blood pressure to make sure she does not get hypotensive, she is not going to get better faster if she continues to smoke. Can you please call her and let know I have made these changes

## 2014-10-10 NOTE — Telephone Encounter (Signed)
Dr. Marin Comment can we change?

## 2014-10-13 NOTE — Telephone Encounter (Signed)
Spoke with pt, advised message from Dr. Marin Comment. Pt understood. She still thinks the medication is too much for her and she is gonna call me back with the name of a medication that worked for her in the past.

## 2014-10-13 NOTE — Telephone Encounter (Signed)
Called pt, unable to leave message.

## 2014-10-13 NOTE — Telephone Encounter (Signed)
Called her cell phone, unable to leave vm there also.

## 2014-10-14 DIAGNOSIS — I469 Cardiac arrest, cause unspecified: Secondary | ICD-10-CM

## 2014-10-14 HISTORY — DX: Cardiac arrest, cause unspecified: I46.9

## 2014-10-29 ENCOUNTER — Inpatient Hospital Stay (HOSPITAL_COMMUNITY)
Admission: EM | Admit: 2014-10-29 | Discharge: 2014-11-23 | DRG: 233 | Disposition: A | Discharge status: Home / Self Care | Payer: Commercial Managed Care - HMO | Attending: Thoracic Surgery (Cardiothoracic Vascular Surgery) | Admitting: Thoracic Surgery (Cardiothoracic Vascular Surgery)

## 2014-10-29 ENCOUNTER — Encounter (HOSPITAL_COMMUNITY): Payer: Self-pay

## 2014-10-29 ENCOUNTER — Emergency Department (HOSPITAL_COMMUNITY): Payer: Commercial Managed Care - HMO

## 2014-10-29 ENCOUNTER — Other Ambulatory Visit (HOSPITAL_COMMUNITY): Payer: Self-pay

## 2014-10-29 DIAGNOSIS — J45909 Unspecified asthma, uncomplicated: Secondary | ICD-10-CM | POA: Diagnosis present

## 2014-10-29 DIAGNOSIS — Z91013 Allergy to seafood: Secondary | ICD-10-CM

## 2014-10-29 DIAGNOSIS — Z91030 Bee allergy status: Secondary | ICD-10-CM

## 2014-10-29 DIAGNOSIS — Z6832 Body mass index (BMI) 32.0-32.9, adult: Secondary | ICD-10-CM

## 2014-10-29 DIAGNOSIS — I4891 Unspecified atrial fibrillation: Secondary | ICD-10-CM | POA: Diagnosis present

## 2014-10-29 DIAGNOSIS — I480 Paroxysmal atrial fibrillation: Secondary | ICD-10-CM | POA: Diagnosis not present

## 2014-10-29 DIAGNOSIS — N179 Acute kidney failure, unspecified: Secondary | ICD-10-CM | POA: Diagnosis not present

## 2014-10-29 DIAGNOSIS — I358 Other nonrheumatic aortic valve disorders: Secondary | ICD-10-CM | POA: Diagnosis present

## 2014-10-29 DIAGNOSIS — Z8249 Family history of ischemic heart disease and other diseases of the circulatory system: Secondary | ICD-10-CM

## 2014-10-29 DIAGNOSIS — Z452 Encounter for adjustment and management of vascular access device: Secondary | ICD-10-CM

## 2014-10-29 DIAGNOSIS — J189 Pneumonia, unspecified organism: Secondary | ICD-10-CM | POA: Diagnosis present

## 2014-10-29 DIAGNOSIS — R0609 Other forms of dyspnea: Secondary | ICD-10-CM | POA: Diagnosis present

## 2014-10-29 DIAGNOSIS — J9601 Acute respiratory failure with hypoxia: Secondary | ICD-10-CM | POA: Diagnosis present

## 2014-10-29 DIAGNOSIS — E876 Hypokalemia: Secondary | ICD-10-CM | POA: Diagnosis present

## 2014-10-29 DIAGNOSIS — I5023 Acute on chronic systolic (congestive) heart failure: Secondary | ICD-10-CM | POA: Diagnosis present

## 2014-10-29 DIAGNOSIS — Z951 Presence of aortocoronary bypass graft: Secondary | ICD-10-CM

## 2014-10-29 DIAGNOSIS — K5900 Constipation, unspecified: Secondary | ICD-10-CM | POA: Diagnosis not present

## 2014-10-29 DIAGNOSIS — R079 Chest pain, unspecified: Secondary | ICD-10-CM

## 2014-10-29 DIAGNOSIS — I2510 Atherosclerotic heart disease of native coronary artery without angina pectoris: Secondary | ICD-10-CM | POA: Diagnosis present

## 2014-10-29 DIAGNOSIS — I5042 Chronic combined systolic (congestive) and diastolic (congestive) heart failure: Secondary | ICD-10-CM | POA: Diagnosis present

## 2014-10-29 DIAGNOSIS — J449 Chronic obstructive pulmonary disease, unspecified: Secondary | ICD-10-CM | POA: Diagnosis present

## 2014-10-29 DIAGNOSIS — Z4659 Encounter for fitting and adjustment of other gastrointestinal appliance and device: Secondary | ICD-10-CM

## 2014-10-29 DIAGNOSIS — I214 Non-ST elevation (NSTEMI) myocardial infarction: Principal | ICD-10-CM | POA: Diagnosis present

## 2014-10-29 DIAGNOSIS — G9341 Metabolic encephalopathy: Secondary | ICD-10-CM | POA: Diagnosis not present

## 2014-10-29 DIAGNOSIS — I5032 Chronic diastolic (congestive) heart failure: Secondary | ICD-10-CM | POA: Diagnosis present

## 2014-10-29 DIAGNOSIS — R001 Bradycardia, unspecified: Secondary | ICD-10-CM | POA: Diagnosis not present

## 2014-10-29 DIAGNOSIS — G931 Anoxic brain damage, not elsewhere classified: Secondary | ICD-10-CM | POA: Diagnosis not present

## 2014-10-29 DIAGNOSIS — I469 Cardiac arrest, cause unspecified: Secondary | ICD-10-CM | POA: Diagnosis present

## 2014-10-29 DIAGNOSIS — R451 Restlessness and agitation: Secondary | ICD-10-CM | POA: Diagnosis not present

## 2014-10-29 DIAGNOSIS — R7309 Other abnormal glucose: Secondary | ICD-10-CM | POA: Diagnosis present

## 2014-10-29 DIAGNOSIS — Z978 Presence of other specified devices: Secondary | ICD-10-CM

## 2014-10-29 DIAGNOSIS — F410 Panic disorder [episodic paroxysmal anxiety] without agoraphobia: Secondary | ICD-10-CM | POA: Diagnosis not present

## 2014-10-29 DIAGNOSIS — I2583 Coronary atherosclerosis due to lipid rich plaque: Secondary | ICD-10-CM

## 2014-10-29 DIAGNOSIS — I340 Nonrheumatic mitral (valve) insufficiency: Secondary | ICD-10-CM | POA: Diagnosis present

## 2014-10-29 DIAGNOSIS — R0602 Shortness of breath: Secondary | ICD-10-CM

## 2014-10-29 DIAGNOSIS — D62 Acute posthemorrhagic anemia: Secondary | ICD-10-CM | POA: Diagnosis not present

## 2014-10-29 DIAGNOSIS — J9811 Atelectasis: Secondary | ICD-10-CM

## 2014-10-29 DIAGNOSIS — Z79899 Other long term (current) drug therapy: Secondary | ICD-10-CM

## 2014-10-29 DIAGNOSIS — I2699 Other pulmonary embolism without acute cor pulmonale: Secondary | ICD-10-CM | POA: Diagnosis not present

## 2014-10-29 DIAGNOSIS — E669 Obesity, unspecified: Secondary | ICD-10-CM | POA: Diagnosis present

## 2014-10-29 DIAGNOSIS — F17210 Nicotine dependence, cigarettes, uncomplicated: Secondary | ICD-10-CM | POA: Diagnosis present

## 2014-10-29 DIAGNOSIS — I509 Heart failure, unspecified: Secondary | ICD-10-CM

## 2014-10-29 DIAGNOSIS — R0600 Dyspnea, unspecified: Secondary | ICD-10-CM | POA: Diagnosis present

## 2014-10-29 DIAGNOSIS — J9600 Acute respiratory failure, unspecified whether with hypoxia or hypercapnia: Secondary | ICD-10-CM

## 2014-10-29 DIAGNOSIS — I255 Ischemic cardiomyopathy: Secondary | ICD-10-CM | POA: Diagnosis present

## 2014-10-29 DIAGNOSIS — I5043 Acute on chronic combined systolic (congestive) and diastolic (congestive) heart failure: Secondary | ICD-10-CM | POA: Diagnosis present

## 2014-10-29 DIAGNOSIS — I10 Essential (primary) hypertension: Secondary | ICD-10-CM | POA: Diagnosis present

## 2014-10-29 DIAGNOSIS — I472 Ventricular tachycardia: Secondary | ICD-10-CM | POA: Diagnosis present

## 2014-10-29 HISTORY — DX: Presence of aortocoronary bypass graft: Z95.1

## 2014-10-29 HISTORY — DX: Ischemic cardiomyopathy: I25.5

## 2014-10-29 LAB — BASIC METABOLIC PANEL
Anion gap: 12 (ref 5–15)
BUN: 17 mg/dL (ref 6–23)
CO2: 21 mmol/L (ref 19–32)
Calcium: 9.8 mg/dL (ref 8.4–10.5)
Chloride: 106 mmol/L (ref 96–112)
Creatinine, Ser: 1.03 mg/dL (ref 0.50–1.10)
GFR calc Af Amer: 63 mL/min — ABNORMAL LOW (ref >90)
GFR calc non Af Amer: 54 mL/min — ABNORMAL LOW (ref >90)
Glucose, Bld: 108 mg/dL — ABNORMAL HIGH (ref 70–99)
Potassium: 3.1 mmol/L — ABNORMAL LOW (ref 3.5–5.1)
Sodium: 139 mmol/L (ref 135–145)

## 2014-10-29 LAB — PHOSPHORUS: Phosphorus: 4 mg/dL (ref 2.3–4.6)

## 2014-10-29 LAB — URINALYSIS, ROUTINE W REFLEX MICROSCOPIC
Bilirubin Urine: NEGATIVE
Glucose, UA: NEGATIVE mg/dL
Hgb urine dipstick: NEGATIVE
Ketones, ur: NEGATIVE mg/dL
Leukocytes, UA: NEGATIVE
Nitrite: NEGATIVE
Protein, ur: NEGATIVE mg/dL
Specific Gravity, Urine: 1.02 (ref 1.005–1.030)
Urobilinogen, UA: 0.2 mg/dL (ref 0.0–1.0)
pH: 5 (ref 5.0–8.0)

## 2014-10-29 LAB — PROTIME-INR
INR: 0.96 (ref 0.00–1.49)
Prothrombin Time: 12.9 seconds (ref 11.6–15.2)

## 2014-10-29 LAB — CBC
HCT: 44.4 % (ref 36.0–46.0)
Hemoglobin: 15.5 g/dL — ABNORMAL HIGH (ref 12.0–15.0)
MCH: 30.2 pg (ref 26.0–34.0)
MCHC: 34.9 g/dL (ref 30.0–36.0)
MCV: 86.5 fL (ref 78.0–100.0)
Platelets: 149 10*3/uL — ABNORMAL LOW (ref 150–400)
RBC: 5.13 MIL/uL — ABNORMAL HIGH (ref 3.87–5.11)
RDW: 15.3 % (ref 11.5–15.5)
WBC: 8.6 10*3/uL (ref 4.0–10.5)

## 2014-10-29 LAB — I-STAT TROPONIN, ED: Troponin i, poc: 0.04 ng/mL (ref 0.00–0.08)

## 2014-10-29 LAB — TSH: TSH: 1.152 u[IU]/mL (ref 0.350–4.500)

## 2014-10-29 LAB — MAGNESIUM: Magnesium: 1.9 mg/dL (ref 1.5–2.5)

## 2014-10-29 LAB — STREP PNEUMONIAE URINARY ANTIGEN: Strep Pneumo Urinary Antigen: NEGATIVE

## 2014-10-29 MED ORDER — DILTIAZEM HCL 25 MG/5ML IV SOLN
30.0000 mg | Freq: Once | INTRAVENOUS | Status: AC
  Administered 2014-10-29: 30 mg via INTRAVENOUS
  Filled 2014-10-29: qty 10

## 2014-10-29 MED ORDER — ASPIRIN EC 81 MG PO TBEC
81.0000 mg | DELAYED_RELEASE_TABLET | Freq: Every day | ORAL | Status: DC
  Administered 2014-10-29 – 2014-11-04 (×6): 81 mg via ORAL
  Filled 2014-10-29 (×6): qty 1

## 2014-10-29 MED ORDER — ONDANSETRON HCL 4 MG/2ML IJ SOLN
4.0000 mg | Freq: Four times a day (QID) | INTRAMUSCULAR | Status: DC | PRN
  Administered 2014-10-31: 4 mg via INTRAVENOUS
  Filled 2014-10-29: qty 2

## 2014-10-29 MED ORDER — HEPARIN SODIUM (PORCINE) 5000 UNIT/ML IJ SOLN: 5000.0000 [IU] | Freq: Three times a day (TID) | INTRAMUSCULAR | Status: DC

## 2014-10-29 MED ORDER — POTASSIUM CHLORIDE 10 MEQ/100ML IV SOLN
10.0000 meq | Freq: Once | INTRAVENOUS | Status: AC
  Administered 2014-10-29: 10 meq via INTRAVENOUS
  Filled 2014-10-29: qty 100

## 2014-10-29 MED ORDER — DEXTROSE 5 % IV SOLN
5.0000 mg/h | INTRAVENOUS | Status: DC
  Administered 2014-10-29: 5 mg/h via INTRAVENOUS

## 2014-10-29 MED ORDER — DILTIAZEM HCL 100 MG IV SOLR
5.0000 mg/h | INTRAVENOUS | Status: DC
  Administered 2014-10-29: 5 mg/h via INTRAVENOUS

## 2014-10-29 MED ORDER — APIXABAN 5 MG PO TABS
5.0000 mg | ORAL_TABLET | Freq: Two times a day (BID) | ORAL | Status: DC
  Administered 2014-10-29 – 2014-10-30 (×2): 5 mg via ORAL
  Filled 2014-10-29 (×2): qty 1

## 2014-10-29 MED ORDER — DEXTROSE 5 % IV SOLN
1.0000 g | INTRAVENOUS | Status: DC
  Administered 2014-10-29 – 2014-10-31 (×3): 1 g via INTRAVENOUS
  Filled 2014-10-29 (×4): qty 10

## 2014-10-29 MED ORDER — DEXTROSE 5 % IV SOLN
500.0000 mg | INTRAVENOUS | Status: DC
  Administered 2014-10-29 – 2014-10-31 (×3): 500 mg via INTRAVENOUS
  Filled 2014-10-29 (×4): qty 500

## 2014-10-29 MED ORDER — ACETAMINOPHEN 325 MG PO TABS
650.0000 mg | ORAL_TABLET | ORAL | Status: DC | PRN
Start: 2014-10-29 — End: 2014-11-04
  Administered 2014-11-01 – 2014-11-02 (×2): 650 mg via ORAL
  Filled 2014-10-29 (×3): qty 2

## 2014-10-29 MED ORDER — DILTIAZEM LOAD VIA INFUSION
20.0000 mg | Freq: Once | INTRAVENOUS | Status: AC
  Administered 2014-10-29: 20 mg via INTRAVENOUS
  Filled 2014-10-29: qty 20

## 2014-10-29 NOTE — ED Notes (Signed)
MD Linker at bedside.

## 2014-10-29 NOTE — ED Notes (Signed)
MD Harlow Mares at bedside.

## 2014-10-29 NOTE — ED Notes (Signed)
Gave pt turkey sandwich and water 

## 2014-10-29 NOTE — ED Notes (Signed)
Attempted report x1. 

## 2014-10-29 NOTE — ED Notes (Signed)
X-ray at bedside

## 2014-10-29 NOTE — H&P (Signed)
Hospitalist Admission History and Physical  Patient name: [name] [name] Medical record number: [id number] Date of birth: December 02, 1945 Age: 69 y.o. Gender: female  Primary Care Provider: [name], MD  Chief Complaint: new onset atrial fibrillation, CAP   History of Present Illness:This is a 69 y.o. year old female with significant past medical history of HTN, asthma presenting with new onset atrial fibrillation, CAP. Pt reports palpitations, SOB over past week. + mild fatigue. Mild central CP and chest tightness. Went to PCPs office w/ sxs. Noted to be in new onset atrial fibrillation w/ RVR. Pt also reports URI sxs over past 2-3 weeks. Was initially rxd abx, however, pt states that she did not take. No fevers or chills. No nausea or vomiting. Denies any orthopnea, PND.  Presented to ER afebrile, HR in 140s, BP in 120s-180s. CBC and BMET grossly WNL apart from K 3.1. CXR concerning of LLL PNA. EKG AFIb w/ RVR HR in 140s. Trop neg x1. Started on dilt gtt. HR improved to 60s. Pt symptomatically feels much improved.    Assessment and Plan: [name] [name] is a 69 y.o. year old female presenting with atrial fibrillation, CAP   Active Problems:   Atrial fibrillation   1- Atrial fibrillation -cont dilt gtt -CHADsVASc score of 3  -likely anticoagulation candidate -f/u cards recs (I have asked ED resident to consult cardiology) -2D ECHO -cycle CEs  2-CAP  -suspect infiltrate on imaging likely CAP given duration of URI sxs and presentation -Rocephin and azithromycin -urine strep and legionella -supplemental O2 prn   3-HTN -hold orals -cont dilt gtt -f/u cards recs   FEN/GI: heart healthy diet  Prophylaxis: sub q heparin for now pending formal cards recs  Disposition: pending further evaluation  Code Status:Full Code    Patient Active Problem List   Diagnosis Date Noted  . Atrial fibrillation 10/29/2014  . HTN (hypertension) 09/16/2011   Past Medical History: Past Medical  History  Diagnosis Date  . Hypertension   . Asthma   . Arthritis     Past Surgical History: History reviewed. No pertinent past surgical history.  Social History: History   Social History  . Marital Status: Widowed    Spouse Name: N/A  . Number of Children: N/A  . Years of Education: N/A   Social History Main Topics  . Smoking status: Current Some Day Smoker -- 1.00 packs/day for 32 years    Types: Cigarettes  . Smokeless tobacco: Not on file  . Alcohol Use: No  . Drug Use: No  . Sexual Activity: Not on file   Other Topics Concern  . None   Social History Narrative    Family History: Family History  Problem Relation Age of Onset  . Cancer Mother   . Heart disease Father     Allergies: No Known Allergies  Current Facility-Administered Medications  Medication Dose Route Frequency Provider Last Rate Last Dose  . acetaminophen (TYLENOL) tablet 650 mg  650 mg Oral Q4H PRN Deneise Lever, MD      . aspirin EC tablet 81 mg  81 mg Oral Daily Deneise Lever, MD      . azithromycin (ZITHROMAX) 500 mg in dextrose 5 % 250 mL IVPB  500 mg Intravenous Q24H Deneise Lever, MD      . cefTRIAXone (ROCEPHIN) 1 g in dextrose 5 % 50 mL IVPB  1 g Intravenous Q24H Deneise Lever, MD      . diltiazem (CARDIZEM) 100 mg in dextrose  5 % 100 mL (1 mg/mL) infusion  5 mg/hr Intravenous Continuous Larence Penning, MD 5 mL/hr at 10/29/14 1655 5 mg/hr at 10/29/14 1655  . diltiazem (CARDIZEM) 100 mg in dextrose 5 % 100 mL (1 mg/mL) infusion  5-15 mg/hr Intravenous Titrated Deneise Lever, MD      . heparin injection 5,000 Units  5,000 Units Subcutaneous 3 times per day Deneise Lever, MD      . ondansetron [facility]) injection 4 mg  4 mg Intravenous Q6H PRN Deneise Lever, MD      . potassium chloride 10 mEq in 100 mL IVPB  10 mEq Intravenous Once Larence Penning, MD 100 mL/hr at 10/29/14 1852 10 mEq at 10/29/14 7858   Current Outpatient Prescriptions  Medication Sig Dispense Refill  .  hydrochlorothiazide (HYDRODIURIL) 12.5 MG tablet Take 12.5 mg by mouth daily.    Marland Kitchen ibuprofen (ADVIL,MOTRIN) 200 MG tablet Take 200 mg by mouth every 4 (four) hours as needed for pain.    . metoprolol succinate (TOPROL-XL) 25 MG 24 hr tablet Take 1 tablet (25 mg total) by mouth daily. Please only take HCTZ 12.5 mg and not the 25 mg if you are going back to metoprolol 30 tablet 5  . potassium chloride 40 MEQ/15ML (20%) LIQD Take 13.3 mEq by mouth every other day.     Marland Kitchen amoxicillin (AMOXIL) 500 MG capsule Take 1 capsule (500 mg total) by mouth 2 (two) times daily. For sinus infection 14 capsule 0  . chlorthalidone (HYGROTON) 25 MG tablet Take 1 tablet (25 mg total) by mouth daily. 30 tablet 5  . meloxicam (MOBIC) 7.5 MG tablet Take 1 tablet (7.5 mg total) by mouth daily. 30 tablet 0   Review Of Systems: 12 point ROS negative except as noted above in HPI.  Physical Exam: Filed Vitals:   10/29/14 1927  BP: 127/69  Pulse: 64  Temp:   Resp: 18    General: alert and cooperative HEENT: PERRLA and extra ocular movement intact Heart: S1, S2 normal, no murmur, rub or gallop, regular rate and rhythm Lungs: clear to auscultation, no wheezes or rales, unlabored breathing and mild R sided rib pain  Abdomen: abdomen is soft without significant tenderness, masses, organomegaly or guarding Extremities: extremities normal, atraumatic, no cyanosis or edema Skin:no rashes Neurology: normal without focal findings  Labs and Imaging: Lab Results  Component Value Date/Time   NA 139 10/29/2014 04:50 PM   K 3.1* 10/29/2014 04:50 PM   CL 106 10/29/2014 04:50 PM   CO2 21 10/29/2014 04:50 PM   BUN 17 10/29/2014 04:50 PM   CREATININE 1.03 10/29/2014 04:50 PM   CREATININE 1.01 10/09/2014 02:49 PM   GLUCOSE 108* 10/29/2014 04:50 PM   Lab Results  Component Value Date   WBC 8.6 10/29/2014   HGB 15.5* 10/29/2014   HCT 44.4 10/29/2014   MCV 86.5 10/29/2014   PLT 149* 10/29/2014    Dg Chest Port 1  View  10/29/2014   CLINICAL DATA:  Acute chest pain at anterior RIGHT chest, soreness in shortness of breath, symptoms for a few days, history smoking, hypertension, asthma  EXAM: PORTABLE CHEST - 1 VIEW  COMPARISON:  Portable exam 1719 hours compared to 10/09/2014  FINDINGS: Enlargement of cardiac silhouette.  Atherosclerotic calcification aorta.  Mediastinal contours and pulmonary vascularity normal.  Bronchitic changes with bibasilar atelectasis.  Cannot completely exclude LEFT basilar infiltrate.  Upper lungs clear.  No gross pleural effusion or pneumothorax.  IMPRESSION: Bronchitic changes with bibasilar  atelectasis, cannot completely exclude LEFT basilar infiltrate.   Electronically Signed   By: Lavonia Dana M.D.   On: 10/29/2014 17:39           Shanda Howells MD  Pager: [phone]

## 2014-10-29 NOTE — ED Provider Notes (Signed)
CSN: [id number]     Arrival date & time 10/29/14  1628 History   First MD Initiated Contact with Patient 10/29/14 1629     Chief Complaint  Patient presents with  . Atrial Fibrillation     (Consider location/radiation/quality/duration/timing/severity/associated sxs/prior Treatment) Patient is a 69 y.o. female presenting with atrial fibrillation. The history is provided by the patient and the EMS personnel.  Atrial Fibrillation This is a new problem. The current episode started in the past 7 days. The problem occurs intermittently. The problem has been waxing and waning. Associated symptoms include chest pain, congestion, fatigue and weakness. Pertinent negatives include no coughing, fever, nausea, sore throat or vomiting. Nothing aggravates the symptoms. She has tried nothing for the symptoms. The treatment provided no relief.    Past Medical History  Diagnosis Date  . Hypertension   . Asthma   . Arthritis    History reviewed. No pertinent past surgical history. Family History  Problem Relation Age of Onset  . Cancer Mother   . Heart disease Father    History  Substance Use Topics  . Smoking status: Current Some Day Smoker -- 1.00 packs/day for 32 years    Types: Cigarettes  . Smokeless tobacco: Not on file  . Alcohol Use: No   OB History    No data available     Review of Systems  Constitutional: Positive for fatigue. Negative for fever.  HENT: Positive for congestion. Negative for sore throat.   Respiratory: Positive for chest tightness and shortness of breath. Negative for cough.   Cardiovascular: Positive for chest pain and palpitations. Negative for leg swelling.  Gastrointestinal: Negative for nausea, vomiting, diarrhea and constipation.  Neurological: Positive for weakness.  All other systems reviewed and are negative.     Allergies  Bee venom and Shrimp  Home Medications   Prior to Admission medications   Medication Sig Start Date End Date Taking?  Authorizing Provider  hydrochlorothiazide (HYDRODIURIL) 12.5 MG tablet Take 12.5 mg by mouth daily.   Yes Historical Provider, MD  ibuprofen (ADVIL,MOTRIN) 200 MG tablet Take 200 mg by mouth every 4 (four) hours as needed for pain.   Yes Historical Provider, MD  metoprolol succinate (TOPROL-XL) 25 MG 24 hr tablet Take 1 tablet (25 mg total) by mouth daily. Please only take HCTZ 12.5 mg and not the 25 mg if you are going back to metoprolol 10/10/14  Yes Thao P Le, DO  potassium chloride 40 MEQ/15ML (20%) LIQD Take 13.3 mEq by mouth every other day.    Yes Historical Provider, MD  amoxicillin (AMOXIL) 500 MG capsule Take 1 capsule (500 mg total) by mouth 2 (two) times daily. For sinus infection 10/09/14   Thao P Le, DO  chlorthalidone (HYGROTON) 25 MG tablet Take 1 tablet (25 mg total) by mouth daily. 10/09/14   Thao P Le, DO  meloxicam (MOBIC) 7.5 MG tablet Take 1 tablet (7.5 mg total) by mouth daily. 10/09/14   Thao P Le, DO   BP 162/105 mmHg  Pulse 56  Temp(Src) 98.6 F (37 C) (Oral)  Resp 20  Ht 5' 7.5" (1.715 m)  Wt 215 lb 6.2 oz (97.7 kg)  BMI 33.22 kg/m2  SpO2 96% Physical Exam  Constitutional: She appears well-developed and well-nourished. No distress.  HENT:  Head: Normocephalic and atraumatic.  Mouth/Throat: No oropharyngeal exudate.  Eyes: Conjunctivae are normal. Pupils are equal, round, and reactive to light.  Neck: Normal range of motion. Neck supple.  Cardiovascular: Normal heart sounds  and intact distal pulses.  An irregularly irregular rhythm present. Tachycardia present.   Pulmonary/Chest: Effort normal and breath sounds normal. No respiratory distress. She has no wheezes. She has no rales.  Abdominal: Soft. Bowel sounds are normal. She exhibits no distension. There is no tenderness.  Musculoskeletal: Normal range of motion. She exhibits no edema.  Skin: Skin is warm and dry. No rash noted. She is not diaphoretic.  Psychiatric: She has a normal mood and affect. Her behavior  is normal. Judgment and thought content normal.  Nursing note and vitals reviewed.   ED Course  Procedures (including critical care time) Labs Review Labs Reviewed  CBC - Abnormal; Notable for the following:    RBC 5.13 (*)    Hemoglobin 15.5 (*)    Platelets 149 (*)    All other components within normal limits  BASIC METABOLIC PANEL - Abnormal; Notable for the following:    Potassium 3.1 (*)    Glucose, Bld 108 (*)    GFR calc non Af Amer 54 (*)    GFR calc Af Amer 63 (*)    All other components within normal limits  CULTURE, BLOOD (ROUTINE X 2)  CULTURE, BLOOD (ROUTINE X 2)  CULTURE, EXPECTORATED SPUTUM-ASSESSMENT  GRAM STAIN  PROTIME-INR  MAGNESIUM  PHOSPHORUS  TSH  COMPREHENSIVE METABOLIC PANEL  CBC WITH DIFFERENTIAL/PLATELET  HIV ANTIBODY (ROUTINE TESTING)  LEGIONELLA ANTIGEN, URINE  STREP PNEUMONIAE URINARY ANTIGEN  I-STAT TROPOININ, ED    Imaging Review Dg Chest Port 1 View  10/29/2014   CLINICAL DATA:  Acute chest pain at anterior RIGHT chest, soreness in shortness of breath, symptoms for a few days, history smoking, hypertension, asthma  EXAM: PORTABLE CHEST - 1 VIEW  COMPARISON:  Portable exam 1719 hours compared to 10/09/2014  FINDINGS: Enlargement of cardiac silhouette.  Atherosclerotic calcification aorta.  Mediastinal contours and pulmonary vascularity normal.  Bronchitic changes with bibasilar atelectasis.  Cannot completely exclude LEFT basilar infiltrate.  Upper lungs clear.  No gross pleural effusion or pneumothorax.  IMPRESSION: Bronchitic changes with bibasilar atelectasis, cannot completely exclude LEFT basilar infiltrate.   Electronically Signed   By: Lavonia Dana M.D.   On: 10/29/2014 17:39     EKG Interpretation None      MDM   Final diagnoses:  Acute chest pain  Atrial fibrillation with RVR    69 year old female with a history of hypertension presents with new onset A. fib with RVR. Seen at her PCPs office for 1 week of intermittent chest  pain with shortness of breath and fatigue. On exam she was tachycardic to 150s and atrial fibrillation. EMS was called and she was brought here for further evaluation. Aspirin 324 mg given in route.  On arrival patient is still in atrial fibrillation with RVR. We will give diltiazem bolus and infusion and obtain laboratory values including electrolytes and troponin. No obvious source for what appears to be paroxysmal atrial fibrillation, although she has had an upper respiratory infection is on some over-the-counter cold medications recently. Her only other medications are HCTZ and meloxicam. Those are not new medications.  Troponin negative. Hypokalemia noted and treated. Significant improvement in tachycardia with second diltiazem bolus. Admitted to the hospitalist and cardiology consultation prior to admission.  Larence Penning, MD 10/29/14 5625  Alfonzo Beers, MD 10/31/14 [phone]

## 2014-10-29 NOTE — Consult Note (Signed)
Patient ID: [name] [name] MRN: [id number], DOB/AGE: 70-03-1946   Admit date: 10/29/2014   Primary Physician: [name], MD Primary Cardiologist: Hilty (new)  Pt. Profile:  69 y/o female with h/o HTN admitted for new onset atrial fibrillation  Problem List  Past Medical History  Diagnosis Date  . Hypertension   . Asthma   . Arthritis     History reviewed. No pertinent past surgical history.   Allergies  No Known Allergies  HPI  The patient is a 69 y/o female with h/o HTN but no prior cardiac history who presented to [facility] today with complaints of palpitations, dyspnea, mild fatigue and mild SSCP off and on over the last week. She also reports URI symptoms over the past 2-3 weeks. She has self treated this with OTC cold medications.  She was seen by her PCP today and was noted to be in new onset atrial fibrillation. She was sent to ED for further management.   On arrival to [facility], she was afebrile. HR was in the 140s. BP stable and at times hypertensive in the 120s-180s. CXR concerning of LLL PNA. White count normal. Mildly hypokalemic with K of 3.1. She was placed on IV Cardizem and IV antibiotics. She has been admitted by [facility]. Ventricular rate is improved but she continues to go in and out of atrial fibrillation. Her symptoms have improved.     Home Medications  Prior to Admission medications   Medication Sig Start Date End Date Taking? Authorizing Provider  hydrochlorothiazide (HYDRODIURIL) 12.5 MG tablet Take 12.5 mg by mouth daily.   Yes Historical Provider, MD  ibuprofen (ADVIL,MOTRIN) 200 MG tablet Take 200 mg by mouth every 4 (four) hours as needed for pain.   Yes Historical Provider, MD  metoprolol succinate (TOPROL-XL) 25 MG 24 hr tablet Take 1 tablet (25 mg total) by mouth daily. Please only take HCTZ 12.5 mg and not the 25 mg if you are going back to metoprolol 10/10/14  Yes Thao P Le, DO  potassium chloride 40 MEQ/15ML (20%) LIQD Take 13.3 mEq by  mouth every other day.    Yes Historical Provider, MD  amoxicillin (AMOXIL) 500 MG capsule Take 1 capsule (500 mg total) by mouth 2 (two) times daily. For sinus infection 10/09/14   Thao P Le, DO  chlorthalidone (HYGROTON) 25 MG tablet Take 1 tablet (25 mg total) by mouth daily. 10/09/14   Thao P Le, DO  meloxicam (MOBIC) 7.5 MG tablet Take 1 tablet (7.5 mg total) by mouth daily. 10/09/14   Thao P Le, DO    Family History  Family History  Problem Relation Age of Onset  . Cancer Mother   . Heart disease Father     Social History  History   Social History  . Marital Status: Widowed    Spouse Name: N/A  . Number of Children: N/A  . Years of Education: N/A   Occupational History  . Not on file.   Social History Main Topics  . Smoking status: Current Some Day Smoker -- 1.00 packs/day for 32 years    Types: Cigarettes  . Smokeless tobacco: Not on file  . Alcohol Use: No  . Drug Use: No  . Sexual Activity: Not on file   Other Topics Concern  . Not on file   Social History Narrative     Review of Systems General:  No chills, fever, night sweats or weight changes.  Cardiovascular:  No chest pain, dyspnea on exertion, edema,  orthopnea, palpitations, paroxysmal nocturnal dyspnea. Dermatological: No rash, lesions/masses Respiratory: No cough, dyspnea Urologic: No hematuria, dysuria Abdominal:   No nausea, vomiting, diarrhea, bright red blood per rectum, melena, or hematemesis Neurologic:  No visual changes, wkns, changes in mental status. All other systems reviewed and are otherwise negative except as noted above.  Physical Exam  Blood pressure 129/74, pulse 61, temperature 98.2 F (36.8 C), temperature source Oral, resp. rate 20, SpO2 93 %.  General: Pleasant, NAD Psych: Normal affect. Neuro: Alert and oriented X 3. Moves all extremities spontaneously. HEENT: Normal  Neck: Supple without bruits or JVD. Lungs:  Resp regular and unlabored, CTA. Heart: RRR no s3, s4, or  murmurs. Abdomen: Soft, non-tender, non-distended, BS + x 4.  Extremities: No clubbing, cyanosis or edema. DP/PT/Radials 2+ and equal bilaterally.  Labs  Troponin [facility] of Care Test)  Recent Labs  10/29/14 1655  TROPIPOC 0.04   No results for input(s): CKTOTAL, CKMB, TROPONINI in the last 72 hours. Lab Results  Component Value Date   WBC 8.6 10/29/2014   HGB 15.5* 10/29/2014   HCT 44.4 10/29/2014   MCV 86.5 10/29/2014   PLT 149* 10/29/2014    Recent Labs Lab 10/29/14 1650  NA 139  K 3.1*  CL 106  CO2 21  BUN 17  CREATININE 1.03  CALCIUM 9.8  GLUCOSE 108*   No results found for: CHOL, HDL, LDLCALC, TRIG No results found for: DDIMER   Radiology/Studies  Dg Chest 2 View  10/09/2014   CLINICAL DATA:  2-3  week history of chest congestion and head cold.  EXAM: CHEST - 2 VIEW  COMPARISON:  Two-view chest x-ray 09/25/2008  FINDINGS: The heart size is normal. Emphysematous changes are evident as before. Mild interstitial coarsening is stable. No focal airspace disease is evident. There is no edema or effusion to suggest failure. There is moderate tortuosity of aorta. Atherosclerotic changes are present at the arch.  IMPRESSION: 1. No acute cardiopulmonary disease or significant interval change. 2. Atherosclerosis. 3. Emphysema.   Electronically Signed   By: San Morelle M.D.   On: 10/09/2014 16:01   Dg Chest Port 1 View  10/29/2014   CLINICAL DATA:  Acute chest pain at anterior RIGHT chest, soreness in shortness of breath, symptoms for a few days, history smoking, hypertension, asthma  EXAM: PORTABLE CHEST - 1 VIEW  COMPARISON:  Portable exam 1719 hours compared to 10/09/2014  FINDINGS: Enlargement of cardiac silhouette.  Atherosclerotic calcification aorta.  Mediastinal contours and pulmonary vascularity normal.  Bronchitic changes with bibasilar atelectasis.  Cannot completely exclude LEFT basilar infiltrate.  Upper lungs clear.  No gross pleural effusion or  pneumothorax.  IMPRESSION: Bronchitic changes with bibasilar atelectasis, cannot completely exclude LEFT basilar infiltrate.   Electronically Signed   By: Lavonia Dana M.D.   On: 10/29/2014 17:39    ECG Atrial fibrillation with RVR of 144 bpm    ASSESSMENT AND PLAN  Active Problems:   Atrial fibrillation   CAP (community acquired pneumonia)  1. New onset Atrial Fibrillation: in the setting of possible PNA. Continue to treat underlying illness. Check TSH. Cycle cardiac enzymes. Repleat K. Continue to monitor on telemetry. Check 2D echo in the am. She may need ischemic eval at some point: NST vs LHC depending on cardiac enzymes and echo findings. She is currently in and out of atrial fibrillation. HR in the 70s. Continue IV Cardizem. CHA2DS2 VASc score is at least 3 (HTN, female sex, Age 3-74). Recommend anticoagulation with a  NOAC, possibly Eliquis 5 mg BID.  She may not require long term anticoagulation if no further atrial fibrillation after treatment of PNA.   2. Possible PNA:  On antibiotics. Management per IM.   3. HTN: moderately elevated. Continue IV Cardizem.   4. Tobacco Abuse: smoking cessation strongly advised.    Signed, Lyda Jester, PA-C 10/29/2014, 8:15 PM

## 2014-10-29 NOTE — ED Notes (Signed)
Pt. Is from Dr. Glenna Durand office. Sent for new onset afib with midsternal chest tightness. Denies N/V/diaphoresis/Dizziness. Pt. Is SOB. Sats 98 on RA. HR 150-190. Pt. States she has been feeling bad for a couple weeks, "gonna jump out of my chest."

## 2014-10-29 NOTE — ED Notes (Signed)
Phlebotomy at bedside.

## 2014-10-30 ENCOUNTER — Inpatient Hospital Stay (HOSPITAL_COMMUNITY): Payer: Commercial Managed Care - HMO

## 2014-10-30 DIAGNOSIS — F17210 Nicotine dependence, cigarettes, uncomplicated: Secondary | ICD-10-CM | POA: Diagnosis present

## 2014-10-30 DIAGNOSIS — R0602 Shortness of breath: Secondary | ICD-10-CM | POA: Diagnosis not present

## 2014-10-30 DIAGNOSIS — R451 Restlessness and agitation: Secondary | ICD-10-CM | POA: Diagnosis not present

## 2014-10-30 DIAGNOSIS — Z79899 Other long term (current) drug therapy: Secondary | ICD-10-CM | POA: Diagnosis not present

## 2014-10-30 DIAGNOSIS — Z8249 Family history of ischemic heart disease and other diseases of the circulatory system: Secondary | ICD-10-CM | POA: Diagnosis not present

## 2014-10-30 DIAGNOSIS — I5023 Acute on chronic systolic (congestive) heart failure: Secondary | ICD-10-CM | POA: Diagnosis present

## 2014-10-30 DIAGNOSIS — K5900 Constipation, unspecified: Secondary | ICD-10-CM | POA: Diagnosis not present

## 2014-10-30 DIAGNOSIS — I25110 Atherosclerotic heart disease of native coronary artery with unstable angina pectoris: Secondary | ICD-10-CM | POA: Diagnosis not present

## 2014-10-30 DIAGNOSIS — Z91030 Bee allergy status: Secondary | ICD-10-CM | POA: Diagnosis not present

## 2014-10-30 DIAGNOSIS — R0600 Dyspnea, unspecified: Secondary | ICD-10-CM | POA: Diagnosis present

## 2014-10-30 DIAGNOSIS — R079 Chest pain, unspecified: Secondary | ICD-10-CM | POA: Diagnosis present

## 2014-10-30 DIAGNOSIS — I10 Essential (primary) hypertension: Secondary | ICD-10-CM | POA: Diagnosis present

## 2014-10-30 DIAGNOSIS — E876 Hypokalemia: Secondary | ICD-10-CM | POA: Diagnosis not present

## 2014-10-30 DIAGNOSIS — J9601 Acute respiratory failure with hypoxia: Secondary | ICD-10-CM | POA: Diagnosis not present

## 2014-10-30 DIAGNOSIS — R001 Bradycardia, unspecified: Secondary | ICD-10-CM | POA: Diagnosis not present

## 2014-10-30 DIAGNOSIS — Z951 Presence of aortocoronary bypass graft: Secondary | ICD-10-CM | POA: Diagnosis not present

## 2014-10-30 DIAGNOSIS — Z6832 Body mass index (BMI) 32.0-32.9, adult: Secondary | ICD-10-CM | POA: Diagnosis not present

## 2014-10-30 DIAGNOSIS — I255 Ischemic cardiomyopathy: Secondary | ICD-10-CM | POA: Diagnosis present

## 2014-10-30 DIAGNOSIS — J45909 Unspecified asthma, uncomplicated: Secondary | ICD-10-CM | POA: Diagnosis present

## 2014-10-30 DIAGNOSIS — I5032 Chronic diastolic (congestive) heart failure: Secondary | ICD-10-CM | POA: Diagnosis present

## 2014-10-30 DIAGNOSIS — Z91013 Allergy to seafood: Secondary | ICD-10-CM | POA: Diagnosis not present

## 2014-10-30 DIAGNOSIS — I480 Paroxysmal atrial fibrillation: Secondary | ICD-10-CM | POA: Diagnosis present

## 2014-10-30 DIAGNOSIS — I214 Non-ST elevation (NSTEMI) myocardial infarction: Secondary | ICD-10-CM | POA: Diagnosis not present

## 2014-10-30 DIAGNOSIS — I472 Ventricular tachycardia: Secondary | ICD-10-CM | POA: Diagnosis present

## 2014-10-30 DIAGNOSIS — G9341 Metabolic encephalopathy: Secondary | ICD-10-CM | POA: Diagnosis not present

## 2014-10-30 DIAGNOSIS — D62 Acute posthemorrhagic anemia: Secondary | ICD-10-CM | POA: Diagnosis not present

## 2014-10-30 DIAGNOSIS — G931 Anoxic brain damage, not elsewhere classified: Secondary | ICD-10-CM | POA: Diagnosis not present

## 2014-10-30 DIAGNOSIS — R7309 Other abnormal glucose: Secondary | ICD-10-CM | POA: Diagnosis present

## 2014-10-30 DIAGNOSIS — I358 Other nonrheumatic aortic valve disorders: Secondary | ICD-10-CM | POA: Diagnosis present

## 2014-10-30 DIAGNOSIS — J449 Chronic obstructive pulmonary disease, unspecified: Secondary | ICD-10-CM | POA: Diagnosis present

## 2014-10-30 DIAGNOSIS — I4891 Unspecified atrial fibrillation: Secondary | ICD-10-CM | POA: Diagnosis not present

## 2014-10-30 DIAGNOSIS — I2510 Atherosclerotic heart disease of native coronary artery without angina pectoris: Secondary | ICD-10-CM | POA: Diagnosis present

## 2014-10-30 DIAGNOSIS — I5043 Acute on chronic combined systolic (congestive) and diastolic (congestive) heart failure: Secondary | ICD-10-CM | POA: Diagnosis not present

## 2014-10-30 DIAGNOSIS — E669 Obesity, unspecified: Secondary | ICD-10-CM | POA: Diagnosis present

## 2014-10-30 DIAGNOSIS — F410 Panic disorder [episodic paroxysmal anxiety] without agoraphobia: Secondary | ICD-10-CM | POA: Diagnosis not present

## 2014-10-30 DIAGNOSIS — I429 Cardiomyopathy, unspecified: Secondary | ICD-10-CM | POA: Diagnosis not present

## 2014-10-30 DIAGNOSIS — R0609 Other forms of dyspnea: Secondary | ICD-10-CM | POA: Diagnosis present

## 2014-10-30 DIAGNOSIS — I5042 Chronic combined systolic (congestive) and diastolic (congestive) heart failure: Secondary | ICD-10-CM | POA: Diagnosis present

## 2014-10-30 DIAGNOSIS — I469 Cardiac arrest, cause unspecified: Secondary | ICD-10-CM | POA: Diagnosis not present

## 2014-10-30 DIAGNOSIS — N179 Acute kidney failure, unspecified: Secondary | ICD-10-CM | POA: Diagnosis not present

## 2014-10-30 DIAGNOSIS — I340 Nonrheumatic mitral (valve) insufficiency: Secondary | ICD-10-CM | POA: Diagnosis present

## 2014-10-30 DIAGNOSIS — J189 Pneumonia, unspecified organism: Secondary | ICD-10-CM | POA: Diagnosis not present

## 2014-10-30 LAB — COMPREHENSIVE METABOLIC PANEL
ALT: 14 U/L (ref 0–35)
AST: 17 U/L (ref 0–37)
Albumin: 3.5 g/dL (ref 3.5–5.2)
Alkaline Phosphatase: 97 U/L (ref 39–117)
Anion gap: 12 (ref 5–15)
BUN: 17 mg/dL (ref 6–23)
CO2: 20 mmol/L (ref 19–32)
Calcium: 8.9 mg/dL (ref 8.4–10.5)
Chloride: 109 mmol/L (ref 96–112)
Creatinine, Ser: 1.06 mg/dL (ref 0.50–1.10)
GFR calc Af Amer: 61 mL/min — ABNORMAL LOW (ref >90)
GFR calc non Af Amer: 52 mL/min — ABNORMAL LOW (ref >90)
Glucose, Bld: 104 mg/dL — ABNORMAL HIGH (ref 70–99)
Potassium: 3.1 mmol/L — ABNORMAL LOW (ref 3.5–5.1)
Sodium: 141 mmol/L (ref 135–145)
Total Bilirubin: 0.8 mg/dL (ref 0.3–1.2)
Total Protein: 6.9 g/dL (ref 6.0–8.3)

## 2014-10-30 LAB — BRAIN NATRIURETIC PEPTIDE: B Natriuretic Peptide: 202.1 pg/mL — ABNORMAL HIGH (ref 0.0–100.0)

## 2014-10-30 LAB — HIV ANTIBODY (ROUTINE TESTING W REFLEX): HIV Screen 4th Generation wRfx: NONREACTIVE

## 2014-10-30 LAB — LEGIONELLA ANTIGEN, URINE

## 2014-10-30 LAB — CBC WITH DIFFERENTIAL/PLATELET
Basophils Absolute: 0 10*3/uL (ref 0.0–0.1)
Basophils Relative: 0 % (ref 0–1)
Eosinophils Absolute: 0.1 10*3/uL (ref 0.0–0.7)
Eosinophils Relative: 1 % (ref 0–5)
HCT: 43.5 % (ref 36.0–46.0)
Hemoglobin: 14.7 g/dL (ref 12.0–15.0)
Lymphocytes Relative: 28 % (ref 12–46)
Lymphs Abs: 2.7 10*3/uL (ref 0.7–4.0)
MCH: 29.4 pg (ref 26.0–34.0)
MCHC: 33.8 g/dL (ref 30.0–36.0)
MCV: 87 fL (ref 78.0–100.0)
Monocytes Absolute: 0.5 10*3/uL (ref 0.1–1.0)
Monocytes Relative: 6 % (ref 3–12)
Neutro Abs: 6 10*3/uL (ref 1.7–7.7)
Neutrophils Relative %: 65 % (ref 43–77)
Platelets: 147 10*3/uL — ABNORMAL LOW (ref 150–400)
RBC: 5 MIL/uL (ref 3.87–5.11)
RDW: 15.6 % — ABNORMAL HIGH (ref 11.5–15.5)
WBC: 9.4 10*3/uL (ref 4.0–10.5)

## 2014-10-30 LAB — APTT: aPTT: 39 seconds — ABNORMAL HIGH (ref 24–37)

## 2014-10-30 LAB — EXPECTORATED SPUTUM ASSESSMENT W GRAM STAIN, RFLX TO RESP C

## 2014-10-30 LAB — TROPONIN I
Troponin I: 0.07 ng/mL — ABNORMAL HIGH (ref <0.031)
Troponin I: 0.07 ng/mL — ABNORMAL HIGH (ref <0.031)

## 2014-10-30 LAB — GLUCOSE, CAPILLARY: Glucose-Capillary: 105 mg/dL — ABNORMAL HIGH (ref 70–99)

## 2014-10-30 LAB — HEPARIN LEVEL (UNFRACTIONATED): Heparin Unfractionated: 1.76 IU/mL — ABNORMAL HIGH (ref 0.30–0.70)

## 2014-10-30 MED ORDER — DILTIAZEM HCL 60 MG PO TABS
60.0000 mg | ORAL_TABLET | Freq: Two times a day (BID) | ORAL | Status: DC
  Administered 2014-10-30 – 2014-11-01 (×5): 60 mg via ORAL
  Filled 2014-10-30 (×5): qty 1

## 2014-10-30 MED ORDER — HEPARIN (PORCINE) IN NACL 100-0.45 UNIT/ML-% IJ SOLN: 1200.0000 [IU]/h | INTRAMUSCULAR | Status: DC

## 2014-10-30 MED ORDER — LORAZEPAM 1 MG PO TABS
1.0000 mg | ORAL_TABLET | Freq: Once | ORAL | Status: AC
  Administered 2014-10-31: 1 mg via ORAL
  Filled 2014-10-30: qty 1

## 2014-10-30 MED ORDER — IBUPROFEN 200 MG PO TABS
400.0000 mg | ORAL_TABLET | Freq: Four times a day (QID) | ORAL | Status: DC | PRN
  Administered 2014-10-30: 400 mg via ORAL
  Filled 2014-10-30: qty 2

## 2014-10-30 MED ORDER — HEPARIN (PORCINE) IN NACL 100-0.45 UNIT/ML-% IJ SOLN
1350.0000 [IU]/h | INTRAMUSCULAR | Status: DC
  Administered 2014-10-30: 1200 [IU]/h via INTRAVENOUS
  Administered 2014-11-01: 1350 [IU]/h via INTRAVENOUS
  Filled 2014-10-30 (×3): qty 250

## 2014-10-30 MED ORDER — POTASSIUM CHLORIDE CRYS ER 20 MEQ PO TBCR
40.0000 meq | EXTENDED_RELEASE_TABLET | Freq: Once | ORAL | Status: AC
  Administered 2014-10-30: 40 meq via ORAL
  Filled 2014-10-30: qty 2

## 2014-10-30 MED ORDER — FUROSEMIDE 10 MG/ML IJ SOLN
40.0000 mg | Freq: Once | INTRAMUSCULAR | Status: AC
  Administered 2014-10-30: 40 mg via INTRAVENOUS
  Filled 2014-10-30: qty 4

## 2014-10-30 MED ORDER — CARVEDILOL 3.125 MG PO TABS
3.1250 mg | ORAL_TABLET | Freq: Two times a day (BID) | ORAL | Status: DC
  Administered 2014-10-30 – 2014-11-01 (×3): 3.125 mg via ORAL
  Filled 2014-10-30 (×5): qty 1

## 2014-10-30 MED ORDER — GUAIFENESIN ER 600 MG PO TB12
1200.0000 mg | ORAL_TABLET | Freq: Two times a day (BID) | ORAL | Status: DC
  Administered 2014-10-31 – 2014-11-02 (×5): 1200 mg via ORAL
  Administered 2014-11-02: 600 mg via ORAL
  Filled 2014-10-30 (×9): qty 2

## 2014-10-30 NOTE — Progress Notes (Signed)
Patient Name: [name] [name] Date of Encounter: 10/30/2014     Active Problems:   Atrial fibrillation   CAP (community acquired pneumonia)    SUBJECTIVE  No CP. Some mild SOB. She has a HA and requesting advil.   CURRENT MEDS . apixaban  5 mg Oral BID  . aspirin EC  81 mg Oral Daily  . azithromycin  500 mg Intravenous Q24H  . cefTRIAXone (ROCEPHIN)  IV  1 g Intravenous Q24H    OBJECTIVE  Filed Vitals:   10/30/14 0432 10/30/14 0500 10/30/14 0700 10/30/14 0822  BP: 148/86  126/74 134/71  Pulse:    76  Temp:  97.8 F (36.6 C)  98.4 F (36.9 C)  TempSrc:  Oral  Oral  Resp:      Height:      Weight:      SpO2: 96%   94%    Intake/Output Summary (Last 24 hours) at 10/30/14 1020 Last data filed at 10/30/14 0600  Gross per 24 hour  Intake      0 ml  Output    750 ml  Net   -750 ml   Filed Weights   10/29/14 2020  Weight: 215 lb 6.2 oz (97.7 kg)    PHYSICAL EXAM  General: Pleasant, NAD. obese Neuro: Alert and oriented X 3. Moves all extremities spontaneously. Psych: Normal affect. HEENT:  Normal  Neck: Supple without bruits or JVD. Lungs:  Resp regular and unlabored, decreased breath sounds at bases Heart: RRR no s3, s4, or murmurs. Abdomen: Soft, non-tender, non-distended, BS + x 4.  Extremities: No clubbing, cyanosis or edema. DP/PT/Radials 2+ and equal bilaterally.  Accessory Clinical Findings  CBC  Recent Labs  10/29/14 1650 10/30/14 0445  WBC 8.6 9.4  NEUTROABS  --  6.0  HGB 15.5* 14.7  HCT 44.4 43.5  MCV 86.5 87.0  PLT 149* 710*   Basic Metabolic Panel  Recent Labs  10/29/14 1650 10/30/14 0445  NA 139 141  K 3.1* 3.1*  CL 106 109  CO2 21 20  GLUCOSE 108* 104*  BUN 17 17  CREATININE 1.03 1.06  CALCIUM 9.8 8.9  MG 1.9  --   PHOS 4.0  --    Liver Function Tests  Recent Labs  10/30/14 0445  AST 17  ALT 14  ALKPHOS 97  BILITOT 0.8  PROT 6.9  ALBUMIN 3.5   Thyroid Function Tests  Recent Labs  10/29/14 1650  TSH  1.152    TELE  NSR with freq PACs and PVCs  Radiology/Studies  Dg Chest 2 View  10/09/2014   CLINICAL DATA:  2-3  week history of chest congestion and head cold.  EXAM: CHEST - 2 VIEW  COMPARISON:  Two-view chest x-ray 09/25/2008  FINDINGS: The heart size is normal. Emphysematous changes are evident as before. Mild interstitial coarsening is stable. No focal airspace disease is evident. There is no edema or effusion to suggest failure. There is moderate tortuosity of aorta. Atherosclerotic changes are present at the arch.  IMPRESSION: 1. No acute cardiopulmonary disease or significant interval change. 2. Atherosclerosis. 3. Emphysema.   Electronically Signed   By: San Morelle M.D.   On: 10/09/2014 16:01   Dg Chest Port 1 View  10/29/2014   CLINICAL DATA:  Acute chest pain at anterior RIGHT chest, soreness in shortness of breath, symptoms for a few days, history smoking, hypertension, asthma  EXAM: PORTABLE CHEST - 1 VIEW  COMPARISON:  Portable exam 1719 hours compared  to 10/09/2014  FINDINGS: Enlargement of cardiac silhouette.  Atherosclerotic calcification aorta.  Mediastinal contours and pulmonary vascularity normal.  Bronchitic changes with bibasilar atelectasis.  Cannot completely exclude LEFT basilar infiltrate.  Upper lungs clear.  No gross pleural effusion or pneumothorax.  IMPRESSION: Bronchitic changes with bibasilar atelectasis, cannot completely exclude LEFT basilar infiltrate.   Electronically Signed   By: Lavonia Dana M.D.   On: 10/29/2014 17:39    ASSESSMENT AND PLAN  [name] [name] is a 69 y.o. female with a history of tobacco abuse and HTN who was admitted to [facility] yesterday for new onset atrial fibrillation and possible PNA  1. New onset Atrial Fibrillation: in the setting of possible PNA. Now resolved and maintaining NSR. Tele with NSR with freq PACs and PVCs HR in 70s -- Continue to treat underlying illness.   -- Troponin neg x1. Continue to cycle.  -- TSH normal -- 2D  ECHO pending -- Still on 5mg /hr IV dilt. Will change her to PO dilt 60mg  BID as she is in NSR. Continue this for now assuming LV function okay. Awaiting results of 2D ECHO. -- CHA2DS2 VASc score is at least 3 (HTN, female sex, Age 58-74) so she was placed on Eliquis 5 mg BID. She may not require long term anticoagulation if no further atrial fibrillation after treatment of PNA. Dr. Debara Pickett recommended at least 1 month of anticoagulation, unless her a-fib returns, then she should stay on it longer. It would be reasonable to do an ischemia evaluation as an outpatient.  2. Possible PNA: On antibiotics. Management per IM.   3. HTN: now well controlled.   4. Tobacco Abuse: smoking cessation strongly advised.    Judy Pimple PA-C  Pager [phone]

## 2014-10-30 NOTE — Progress Notes (Signed)
PROGRESS NOTE  [name] [name] NFA:[id number] DOB: 1945/10/16 DOA: 10/29/2014 PCP: [name], MD  HPI: [name] [name] is a 69 year old female with a past medical history of HTN, asthma, and arthritis who presented to [facility] on 10/29/14 with new onset atrial fibrillation with RVR. Patient was seen by her PCP on the day of admission complaining of 1 week of intermittent chest pain and was found to be in atrial fibrillation with HR in the 150s. EMS was called and patient was transported to [facility].   Since admission patient has been going in and out of atrial fibrillation. Patient was hypokalemic with potassium of 3.1 otherwise labs are unremarkable. CXR showed bronchitic changes but could not rule out left basilar infiltrate. Patient started on Zithromax and Rocephin. Delta troponin was negative. HR decreased to 60s with diltiazem. Started on anticoagulation.   Subjective: Today, [name] [name] is feeling better. She is currently pain free but reports episodes of intermittent pain through the night. Denies chest pain, shortness of breath, nausea, vomiting, or diarrhea. Patient has healthy appetite and has been ambulatory within her room. Reports productive cough with yellow sputum but states this is improving. She is anxious to get home.   Assessment/Plan:    Atrial fibrillation - Patient presented with new onset paroxysmal atrial fibrillation with RVR - Patient given diltiazem drip for rate control with last HR 76 bpm - TSH was normal at 1.152 - Troponin negative at 0.04 - CHADsVasc score of 4 (CHF, female gender, age of 47 and hypertension) started on oral Eliquis 5mg  daily - Cardiology consulted and recommend anticoagulation for at least a month with possibility to extend if patient reverts to atrial fibrillation after respiratory illness is resolved     CAP (community acquired pneumonia) - Patient presented with cough, fatigue, and congestion  - CXR showed bronchitic changes but  could not rule out left basilar infiltrate - Started on Rochepin and Zithromax - Patient is afebrile with a temp of 98.66F and has oxygen saturation of 94% on room air - Awaiting sputum culture results - Continue ABX.  Cardiomyopathy - 2-D echocardiogram showed ejection fraction of 35-40% with increased filling pressure consistent with diastolic dysfunction. - Patient seen by cardiology and they favor cath, await their recommendation on timing of the cardiac catheterization. - Appears to be euvolemic, asymptomatic without orthopnea or DOE. - On Cardizem 80 mg twice a day for rate control of atrial fibrillation, I will add low dose of Coreg. - Given 1 dose of Lasix, repeat chest x-ray in a.m.  Hypokalemia - Potassium of 3.1, repleted orally and parenterally, check potassium in a.m.  DVT Prophylaxis:  Patient anticoagulated with Eliquis 5mg  daily   Code Status: Full  Family Communication: No family at bedside  Disposition Plan: Remain inpatient.  Consultants:  Cardiology   Procedures:  None   Antibiotics: Anti-infectives    Start     Dose/Rate Route Frequency Ordered Stop   10/29/14 1945  cefTRIAXone (ROCEPHIN) 1 g in dextrose 5 % 50 mL IVPB     1 g 100 mL/hr over 30 Minutes Intravenous Every 24 hours 10/29/14 1940 11/05/14 1944   10/29/14 1945  azithromycin (ZITHROMAX) 500 mg in dextrose 5 % 250 mL IVPB     500 mg 250 mL/hr over 60 Minutes Intravenous Every 24 hours 10/29/14 1940 11/05/14 1944      Objective: Filed Vitals:   10/30/14 0432 10/30/14 0500 10/30/14 0700 10/30/14 0822  BP: 148/86  126/74 134/71  Pulse:    76  Temp:  97.8 F (36.6 C)  98.4 F (36.9 C)  TempSrc:  Oral  Oral  Resp:      Height:      Weight:      SpO2: 96%   94%    Intake/Output Summary (Last 24 hours) at 10/30/14 1027 Last data filed at 10/30/14 0600  Gross per 24 hour  Intake      0 ml  Output    750 ml  Net   -750 ml   Filed Weights   10/29/14 2020  Weight: 97.7 kg (215 lb  6.2 oz)    Exam: General: Well developed, well nourished, NAD, appears stated age  68:  Anicteic Sclera  Neck: Supple Cardiovascular: RRR, S1 S2 auscultated, no rubs, murmurs or gallops.   Respiratory: Clear to auscultation bilaterally with equal chest rise- no rales, rhonchi, or wheezes Abdomen: Soft, nontender, nondistended, + bowel sounds  Extremities: warm dry without cyanosis clubbing or edema.  Neuro: AAOx3  Skin: Without rashes exudates or nodules.   Psych: Normal affect and demeanor with intact judgement and insight. Patient speaking in full sentences with appropriate speech.    Data Reviewed: Basic Metabolic Panel:  Recent Labs Lab 10/29/14 1650 10/30/14 0445  NA 139 141  K 3.1* 3.1*  CL 106 109  CO2 21 20  GLUCOSE 108* 104*  BUN 17 17  CREATININE 1.03 1.06  CALCIUM 9.8 8.9  MG 1.9  --   PHOS 4.0  --    Liver Function Tests:  Recent Labs Lab 10/30/14 0445  AST 17  ALT 14  ALKPHOS 97  BILITOT 0.8  PROT 6.9  ALBUMIN 3.5   CBC:  Recent Labs Lab 10/29/14 1650 10/30/14 0445  WBC 8.6 9.4  NEUTROABS  --  6.0  HGB 15.5* 14.7  HCT 44.4 43.5  MCV 86.5 87.0  PLT 149* 147*   Studies: Dg Chest Port 1 View  10/29/2014   CLINICAL DATA:  Acute chest pain at anterior RIGHT chest, soreness in shortness of breath, symptoms for a few days, history smoking, hypertension, asthma  EXAM: PORTABLE CHEST - 1 VIEW  COMPARISON:  Portable exam 1719 hours compared to 10/09/2014  FINDINGS: Enlargement of cardiac silhouette.  Atherosclerotic calcification aorta.  Mediastinal contours and pulmonary vascularity normal.  Bronchitic changes with bibasilar atelectasis.  Cannot completely exclude LEFT basilar infiltrate.  Upper lungs clear.  No gross pleural effusion or pneumothorax.  IMPRESSION: Bronchitic changes with bibasilar atelectasis, cannot completely exclude LEFT basilar infiltrate.   Electronically Signed   By: Lavonia Dana M.D.   On: 10/29/2014 17:39    Scheduled  Meds: . apixaban  5 mg Oral BID  . aspirin EC  81 mg Oral Daily  . azithromycin  500 mg Intravenous Q24H  . cefTRIAXone (ROCEPHIN)  IV  1 g Intravenous Q24H   Continuous Infusions: . diltiazem (CARDIZEM) infusion 5 mg/hr (10/29/14 2241)    Active Problems:   Atrial fibrillation   CAP (community acquired pneumonia)    Raspect, Erin, PA-S [facility] 10/30/2014, 10:28 AM   Addendum  Patient seen and examined, chart and data base reviewed.  I agree with the above assessment and plan.  For full details please see Mrs. Raspect, Erin, PA-S note.  I reviewed and amended the above note as appropriate.   Birdie Hopes, MD [facility] Pager: [phone] 10/30/2014, 3:45 PM '

## 2014-10-30 NOTE — Progress Notes (Signed)
  Echocardiogram 2D Echocardiogram has been performed.  Esmerelda Finnigan 10/30/2014, 11:03 AM

## 2014-10-30 NOTE — Discharge Instructions (Addendum)
Information on my medicine - ELIQUIS (apixaban)  This medication education was reviewed with me or my healthcare representative as part of my discharge preparation.  The pharmacist that spoke with me during my hospital stay was:  Wayland Salinas, [facility]  Why was Eliquis prescribed for you? Eliquis was prescribed for you to reduce the risk of a blood clot forming that can cause a stroke if you have a medical condition called atrial fibrillation (a type of irregular heartbeat).  What do You need to know about Eliquis ? Take your Eliquis TWICE DAILY - one tablet in the morning and one tablet in the evening with or without food. If you have difficulty swallowing the tablet whole please discuss with your pharmacist how to take the medication safely.  Take Eliquis exactly as prescribed by your doctor and DO NOT stop taking Eliquis without talking to the doctor who prescribed the medication.  Stopping may increase your risk of developing a stroke.  Refill your prescription before you run out.  After discharge, you should have regular check-up appointments with your healthcare provider that is prescribing your Eliquis.  In the future your dose may need to be changed if your kidney function or weight changes by a significant amount or as you get older.  What do you do if you miss a dose? If you miss a dose, take it as soon as you remember on the same day and resume taking twice daily.  Do not take more than one dose of ELIQUIS at the same time to make up a missed dose.  Important Safety Information A possible side effect of Eliquis is bleeding. You should call your healthcare provider right away if you experience any of the following: ? Bleeding from an injury or your nose that does not stop. ? Unusual colored urine (red or dark brown) or unusual colored stools (red or black). ? Unusual bruising for unknown reasons. ? A serious fall or if you hit your head (even if there is no  bleeding).  Some medicines may interact with Eliquis and might increase your risk of bleeding or clotting while on Eliquis. To help avoid this, consult your healthcare provider or pharmacist prior to using any new prescription or non-prescription medications, including herbals, vitamins, non-steroidal anti-inflammatory drugs (NSAIDs) and supplements.  This website has more information on Eliquis (apixaban): http://www.eliquis.com/eliquis/home   Coronary Artery Bypass Grafting, Care After Refer to this sheet in the next few weeks. These instructions provide you with information on caring for yourself after your procedure. Your health care provider may also give you more specific instructions. Your treatment has been planned according to current medical practices, but problems sometimes occur. Call your health care provider if you have any problems or questions after your procedure. WHAT TO EXPECT AFTER THE PROCEDURE Recovery from surgery will be different for everyone. Some people feel well after 3 or 4 weeks, while for others it takes longer. After your procedure, it is typical to have the following:  Nausea and a lack of appetite.   Constipation.  Weakness and fatigue.   Depression or irritability.   Pain or discomfort at your incision site. HOME CARE INSTRUCTIONS  Take medicines only as directed by your health care provider. Do not stop taking medicines or start any new medicines without first checking with your health care provider.  Take your pulse as directed by your health care provider.  Perform deep breathing as directed by your health care provider. If you were given a  device called an incentive spirometer, use it to practice deep breathing several times a day. Support your chest with a pillow or your arms when you take deep breaths or cough.  Keep incision areas clean, dry, and protected. Remove or change any bandages (dressings) only as directed by your health care  provider. You may have skin adhesive strips over the incision areas. Do not take the strips off. They will fall off on their own.  Check incision areas daily for any swelling, redness, or drainage.  If incisions were made in your legs, do the following:  Avoid crossing your legs.   Avoid sitting for long periods of time. Change positions every 30 minutes.   Elevate your legs when you are sitting.  Wear compression stockings as directed by your health care provider. These stockings help keep blood clots from forming in your legs.  Take showers once your health care provider approves. Until then, only take sponge baths. Pat incisions dry. Do not rub incisions with a washcloth or towel. Do not take baths, swim, or use a hot tub until your health care provider approves.  Eat foods that are high in fiber, such as raw fruits and vegetables, whole grains, beans, and nuts. Meats should be lean cut. Avoid canned, processed, and fried foods.  Drink enough fluid to keep your urine clear or pale yellow.  Weigh yourself every day. This helps identify if you are retaining fluid that may make your heart and lungs work harder.  Rest and limit activity as directed by your health care provider. You may be instructed to:  Stop any activity at once if you have chest pain, shortness of breath, irregular heartbeats, or dizziness. Get help right away if you have any of these symptoms.  Move around frequently for short periods or take short walks as directed by your health care provider. Increase your activities gradually. You may need physical therapy or cardiac rehabilitation to help strengthen your muscles and build your endurance.  Avoid lifting, pushing, or pulling anything heavier than 10 lb (4.5 kg) for at least 6 weeks after surgery.  Do not drive until your health care provider approves.  Ask your health care provider when you may return to work.  Ask your health care provider when you may  resume sexual activity.  Keep all follow-up visits as directed by your health care provider. This is important. SEEK MEDICAL CARE IF:  You have swelling, redness, increasing pain, or drainage at the site of an incision.  You have a fever.  You have swelling in your ankles or legs.  You have pain in your legs.   You gain 2 or more pounds (0.9 kg) a day.  You are nauseous or vomit.  You have diarrhea. SEEK IMMEDIATE MEDICAL CARE IF:  You have chest pain that goes to your jaw or arms.  You have shortness of breath.   You have a fast or irregular heartbeat.   You notice a "clicking" in your breastbone (sternum) when you move.   You have numbness or weakness in your arms or legs.  You feel dizzy or light-headed.  MAKE SURE YOU:  Understand these instructions.  Will watch your condition.  Will get help right away if you are not doing well or get worse. Document Released: 02/18/2005 Document Revised: 12/16/2013 Document Reviewed: 01/08/2013 [facility] Patient Information 2015 [place], [place]. This information is not intended to replace advice given to you by your health care provider. Make sure you discuss any  questions you have with your health care provider.  Endoscopic Saphenous Vein Harvesting Care After Refer to this sheet in the next few weeks. These instructions provide you with information on caring for yourself after your procedure. Your health care provider may also give you more specific instructions. Your treatment has been planned according to current medical practices, but problems sometimes occur. Call your health care provider if you have any problems or questions after your procedure. HOME CARE INSTRUCTIONS Medicine  Take whatever pain medicine your surgeon prescribes. Follow the directions carefully. Do not take over-the-counter pain medicine unless your surgeon says it is okay. Some pain medicine can cause bleeding problems for several weeks after  surgery.  Follow your surgeon's instructions about driving. You will probably not be permitted to drive after heart surgery.  Take any medicines your surgeon prescribes. Any medicines you took before your heart surgery should be checked with your health care provider before you start taking them again. Wound care  If your surgeon has prescribed an elastic bandage or stocking, ask how long you should wear it.  Check the area around your surgical cuts (incisions) whenever your bandages (dressings) are changed. Look for any redness or swelling.  You will need to return to have the stitches (sutures) or staples taken out. Ask your surgeon when to do that.  Ask your surgeon when you can shower or bathe. Activity  Try to keep your legs raised when you are sitting.  Do any exercises your health care providers have given you. These may include deep breathing exercises, coughing, walking, or other exercises. SEEK MEDICAL CARE IF:  You have any questions about your medicines.  You have more leg pain, especially if your pain medicine stops working.  New or growing bruises develop on your leg.  Your leg swells, feels tight, or becomes red.  You have numbness in your leg. SEEK IMMEDIATE MEDICAL CARE IF:  Your pain gets much worse.  Blood or fluid leaks from any of the incisions.  Your incisions become warm, swollen, or red.  You have chest pain.  You have trouble breathing.  You have a fever.  You have more pain near your leg incision. MAKE SURE YOU:  Understand these instructions.  Will watch your condition.  Will get help right away if you are not doing well or get worse. Document Released: 04/13/2011 Document Revised: 08/06/2013 Document Reviewed: 04/13/2011 [facility] Patient Information 2015 [place], [place]. This information is not intended to replace advice given to you by your health care provider. Make sure you discuss any questions you have with your health care  provider.

## 2014-10-30 NOTE — Progress Notes (Addendum)
ANTICOAGULATION CONSULT NOTE - Initial Consult  Pharmacy Consult for heparin Indication: atrial fibrillation  Allergies  Allergen Reactions  . Bee Venom Anaphylaxis  . Shrimp [Shellfish Allergy] Swelling    Patient Measurements: Height: 5' 7.5" (171.5 cm) Weight: 215 lb 6.2 oz (97.7 kg) IBW/kg (Calculated) : 62.75 Heparin Dosing Weight: 84 kg  Vital Signs: Temp: 98.4 F (36.9 C) (03/17 0822) Temp Source: Oral (03/17 0822) BP: 149/117 mmHg (03/17 1308) Pulse Rate: 76 (03/17 0822)  Labs:  Recent Labs  10/29/14 1650 10/30/14 0445 10/30/14 1500  HGB 15.5* 14.7  --   HCT 44.4 43.5  --   PLT 149* 147*  --   LABPROT 12.9  --   --   INR 0.96  --   --   CREATININE 1.03 1.06  --   TROPONINI  --   --  0.07*    Estimated Creatinine Clearance: 60.7 mL/min (by C-G formula based on Cr of 1.06).   Medical History: Past Medical History  Diagnosis Date  . Hypertension   . Asthma   . Arthritis     Medications:  See PTA medication list  Assessment: 69 y/o female who presented on 3/16 from PCP office for new onset Afib and URI symptoms. She was found to have systolic and diastolic HF and concern for CAD contributing to decreased LV function. Plan is for cath this admit. Patient received 2 doses of apixaban 5 mg, last this am at 10:00. Pharmacy consulted to begin IV heparin with no bolus for Afib. No bleeding noted, Hb is normal, platelets are slightly low at 147.  Goal of Therapy:  Heparin level 0.3-0.7 units/ml aPTT 66-102 seconds Monitor platelets by anticoagulation protocol: Yes   Plan:  - Heparin drip at 1200 units/hr with no bolus - being at 22:00 - Baseline aPTT and heparin level - 6 hr aPTT and heparin level - Daily aPTT, heparin level,  and CBC (until aPTT and heparin level correlate)  - Monitor for s/sx of bleeding  [facility], Tebbetts.D., BCPS Clinical Pharmacist Pager: [phone] 10/30/2014 4:24 PM   Addendum: Baseline aPTT is 39 which is below goal  and heparin level is >1 per lab and supratherapeutic but this is reflective of the apixaban. Will use aPTTs until heparin level and aPTT correlate.  Will begin IV heparin now at 1200 units/hr with no bolus - spoke with RN about plan 6 hr aPTT and heparin level  [facility], Pharm.D., BCPS Clinical Pharmacist Pager: [phone] 10/30/2014 8:03 PM

## 2014-10-30 NOTE — Progress Notes (Signed)
UR completed 

## 2014-10-31 ENCOUNTER — Encounter (HOSPITAL_COMMUNITY)
Admission: EM | Disposition: A | Discharge status: Home / Self Care | Payer: Self-pay | Source: Home / Self Care | Attending: Thoracic Surgery (Cardiothoracic Vascular Surgery)

## 2014-10-31 DIAGNOSIS — I429 Cardiomyopathy, unspecified: Secondary | ICD-10-CM

## 2014-10-31 LAB — HEPARIN LEVEL (UNFRACTIONATED)
Heparin Unfractionated: 0.82 IU/mL — ABNORMAL HIGH (ref 0.30–0.70)
Heparin Unfractionated: 0.76 IU/mL — ABNORMAL HIGH (ref 0.30–0.70)
Heparin Unfractionated: 0.83 IU/mL — ABNORMAL HIGH (ref 0.30–0.70)

## 2014-10-31 LAB — COMPREHENSIVE METABOLIC PANEL
ALT: 14 U/L (ref 0–35)
AST: 16 U/L (ref 0–37)
Albumin: 3.4 g/dL — ABNORMAL LOW (ref 3.5–5.2)
Alkaline Phosphatase: 100 U/L (ref 39–117)
Anion gap: 6 (ref 5–15)
BUN: 12 mg/dL (ref 6–23)
CO2: 25 mmol/L (ref 19–32)
Calcium: 9.1 mg/dL (ref 8.4–10.5)
Chloride: 110 mmol/L (ref 96–112)
Creatinine, Ser: 0.97 mg/dL (ref 0.50–1.10)
GFR calc Af Amer: 68 mL/min — ABNORMAL LOW (ref >90)
GFR calc non Af Amer: 58 mL/min — ABNORMAL LOW (ref >90)
Glucose, Bld: 107 mg/dL — ABNORMAL HIGH (ref 70–99)
Potassium: 3.3 mmol/L — ABNORMAL LOW (ref 3.5–5.1)
Sodium: 141 mmol/L (ref 135–145)
Total Bilirubin: 0.9 mg/dL (ref 0.3–1.2)
Total Protein: 7.1 g/dL (ref 6.0–8.3)

## 2014-10-31 LAB — CBC
HCT: 44.5 % (ref 36.0–46.0)
Hemoglobin: 15 g/dL (ref 12.0–15.0)
MCH: 29.5 pg (ref 26.0–34.0)
MCHC: 33.7 g/dL (ref 30.0–36.0)
MCV: 87.4 fL (ref 78.0–100.0)
Platelets: 136 10*3/uL — ABNORMAL LOW (ref 150–400)
RBC: 5.09 MIL/uL (ref 3.87–5.11)
RDW: 15.5 % (ref 11.5–15.5)
WBC: 8.5 10*3/uL (ref 4.0–10.5)

## 2014-10-31 LAB — APTT
aPTT: 36 seconds (ref 24–37)
aPTT: 56 seconds — ABNORMAL HIGH (ref 24–37)
aPTT: 76 seconds — ABNORMAL HIGH (ref 24–37)

## 2014-10-31 LAB — TROPONIN I: Troponin I: 0.07 ng/mL — ABNORMAL HIGH (ref <0.031)

## 2014-10-31 SURGERY — LEFT HEART CATHETERIZATION WITH CORONARY ANGIOGRAM
Anesthesia: LOCAL

## 2014-10-31 MED ORDER — PREDNISONE 20 MG PO TABS
60.0000 mg | ORAL_TABLET | Freq: Once | ORAL | Status: DC
  Filled 2014-10-31: qty 3

## 2014-10-31 MED ORDER — FUROSEMIDE 10 MG/ML IJ SOLN
20.0000 mg | Freq: Once | INTRAMUSCULAR | Status: AC
  Administered 2014-10-31: 20 mg via INTRAVENOUS
  Filled 2014-10-31: qty 2

## 2014-10-31 MED ORDER — DIPHENHYDRAMINE HCL 50 MG/ML IJ SOLN
25.0000 mg | INTRAMUSCULAR | Status: AC
  Administered 2014-10-31: 25 mg via INTRAVENOUS
  Filled 2014-10-31: qty 1

## 2014-10-31 MED ORDER — SODIUM CHLORIDE 0.9 % IJ SOLN: 3.0000 mL | INTRAMUSCULAR | Status: DC | PRN

## 2014-10-31 MED ORDER — ASPIRIN 81 MG PO CHEW: 81.0000 mg | CHEWABLE_TABLET | ORAL | Status: DC

## 2014-10-31 MED ORDER — SODIUM CHLORIDE 0.9 % IV SOLN
1.0000 mL/kg/h | INTRAVENOUS | Status: DC
  Administered 2014-10-31: 1 mL/kg/h via INTRAVENOUS

## 2014-10-31 MED ORDER — SODIUM CHLORIDE 0.9 % IV SOLN: 1.0000 mL/kg/h | INTRAVENOUS | Status: DC

## 2014-10-31 MED ORDER — ASPIRIN 81 MG PO CHEW
81.0000 mg | CHEWABLE_TABLET | ORAL | Status: AC
Start: 2014-10-31 — End: 2014-10-31
  Administered 2014-10-31: 81 mg via ORAL
  Filled 2014-10-31: qty 1

## 2014-10-31 MED ORDER — LORAZEPAM 2 MG/ML IJ SOLN
2.0000 mg | INTRAMUSCULAR | Status: DC
  Administered 2014-10-31 – 2014-11-03 (×4): 2 mg via INTRAVENOUS
  Filled 2014-10-31 (×6): qty 1

## 2014-10-31 MED ORDER — METHYLPREDNISOLONE SODIUM SUCC 125 MG IJ SOLR
125.0000 mg | INTRAMUSCULAR | Status: AC
  Administered 2014-10-31: 125 mg via INTRAVENOUS
  Filled 2014-10-31: qty 2

## 2014-10-31 MED ORDER — SODIUM CHLORIDE 0.9 % IJ SOLN
3.0000 mL | Freq: Two times a day (BID) | INTRAMUSCULAR | Status: DC
  Administered 2014-10-31 – 2014-11-01 (×2): 3 mL via INTRAVENOUS

## 2014-10-31 MED ORDER — FUROSEMIDE 10 MG/ML IJ SOLN
40.0000 mg | Freq: Once | INTRAMUSCULAR | Status: AC
  Administered 2014-10-31: 40 mg via INTRAVENOUS
  Filled 2014-10-31: qty 4

## 2014-10-31 MED ORDER — SODIUM CHLORIDE 0.9 % IV SOLN: 250.0000 mL | INTRAVENOUS | Status: DC | PRN

## 2014-10-31 MED ORDER — FUROSEMIDE 10 MG/ML IJ SOLN
40.0000 mg | Freq: Two times a day (BID) | INTRAMUSCULAR | Status: DC
  Administered 2014-10-31 – 2014-11-01 (×2): 40 mg via INTRAVENOUS
  Filled 2014-10-31 (×2): qty 4

## 2014-10-31 MED ORDER — POTASSIUM CHLORIDE CRYS ER 20 MEQ PO TBCR
40.0000 meq | EXTENDED_RELEASE_TABLET | Freq: Four times a day (QID) | ORAL | Status: AC
  Administered 2014-10-31: 40 meq via ORAL
  Filled 2014-10-31 (×2): qty 2

## 2014-10-31 MED ORDER — FAMOTIDINE 20 MG PO TABS
20.0000 mg | ORAL_TABLET | ORAL | Status: DC
  Filled 2014-10-31: qty 1

## 2014-10-31 MED ORDER — FAMOTIDINE IN NACL 20-0.9 MG/50ML-% IV SOLN
20.0000 mg | INTRAVENOUS | Status: AC
  Administered 2014-10-31: 20 mg via INTRAVENOUS
  Filled 2014-10-31: qty 50

## 2014-10-31 NOTE — Progress Notes (Signed)
Patient Name: [name] [name] Date of Encounter: 10/31/2014     Active Problems:   HTN (hypertension)   Atrial fibrillation   CAP (community acquired pneumonia)   Cardiomyopathy   Hypokalemia   Acute on chronic combined systolic and diastolic HF (heart failure)   Acute chest pain   SOB (shortness of breath)    SUBJECTIVE  Sob. Refusing LHC. Says she has severe contrast dye allergy.   CURRENT MEDS . aspirin EC  81 mg Oral Daily  . azithromycin  500 mg Intravenous Q24H  . carvedilol  3.125 mg Oral BID WC  . cefTRIAXone (ROCEPHIN)  IV  1 g Intravenous Q24H  . diltiazem  60 mg Oral Q12H  . guaiFENesin  1,200 mg Oral BID  . sodium chloride  3 mL Intravenous Q12H    OBJECTIVE  Filed Vitals:   10/30/14 1000 10/30/14 1308 10/30/14 2150 10/31/14 0503  BP: 122/108 149/117 149/67 180/98  Pulse:   72 74  Temp:   98 F (36.7 C) 98.2 F (36.8 C)  TempSrc:   Oral Oral  Resp:   16 20  Height:      Weight:    214 lb 11.2 oz (97.387 kg)  SpO2:   92% 92%    Intake/Output Summary (Last 24 hours) at 10/31/14 0852 Last data filed at 10/31/14 0300  Gross per 24 hour  Intake   1080 ml  Output   1451 ml  Net   -371 ml   Filed Weights   10/29/14 2020 10/31/14 0503  Weight: 215 lb 6.2 oz (97.7 kg) 214 lb 11.2 oz (97.387 kg)    PHYSICAL EXAM  General: Pleasant, NAD. obese Neuro: Alert and oriented X 3. Moves all extremities spontaneously. Psych: Normal affect. HEENT:  Normal  Neck: Supple without bruits or JVD. Lungs:  Resp regular and unlabored, decreased breath sounds at bases Heart: RRR no s3, s4, or murmurs. Abdomen: Soft, non-tender, non-distended, BS + x 4.  Extremities: No clubbing, cyanosis or edema. DP/PT/Radials 2+ and equal bilaterally.  Accessory Clinical Findings  CBC  Recent Labs  10/30/14 0445 10/31/14 0555  WBC 9.4 8.5  NEUTROABS 6.0  --   HGB 14.7 15.0  HCT 43.5 44.5  MCV 87.0 87.4  PLT 147* 160*   Basic Metabolic Panel  Recent Labs  10/29/14 1650 10/30/14 0445 10/31/14 0555  NA 139 141 141  K 3.1* 3.1* 3.3*  CL 106 109 110  CO2 21 20 25   GLUCOSE 108* 104* 107*  BUN 17 17 12   CREATININE 1.03 1.06 0.97  CALCIUM 9.8 8.9 9.1  MG 1.9  --   --   PHOS 4.0  --   --    Liver Function Tests  Recent Labs  10/30/14 0445 10/31/14 0555  AST 17 16  ALT 14 14  ALKPHOS 97 100  BILITOT 0.8 0.9  PROT 6.9 7.1  ALBUMIN 3.5 3.4*   Thyroid Function Tests  Recent Labs  10/29/14 1650  TSH 1.152    TELE  NSR with freq PACs and PVCs. NSVT- 14 beat run  Radiology/Studies  Dg Chest 2 View  10/30/2014   CLINICAL DATA:  Shortness of breath.  EXAM: CHEST  2 VIEW  COMPARISON:  October 29, 2014.  FINDINGS: Stable cardiomediastinal silhouette. No pneumothorax or pleural effusion is noted. Slightly increased interstitial densities are noted in both lower lobes concerning for pulmonary edema. Bony thorax appears to be intact.  IMPRESSION: Slightly increased bilateral lower lobe interstitial densities are  noted concerning for pulmonary edema.   Electronically Signed   By: Marijo Conception, M.D.   On: 10/30/2014 17:48   Dg Chest 2 View  10/09/2014   CLINICAL DATA:  2-3  week history of chest congestion and head cold.  EXAM: CHEST - 2 VIEW  COMPARISON:  Two-view chest x-ray 09/25/2008  FINDINGS: The heart size is normal. Emphysematous changes are evident as before. Mild interstitial coarsening is stable. No focal airspace disease is evident. There is no edema or effusion to suggest failure. There is moderate tortuosity of aorta. Atherosclerotic changes are present at the arch.  IMPRESSION: 1. No acute cardiopulmonary disease or significant interval change. 2. Atherosclerosis. 3. Emphysema.   Electronically Signed   By: San Morelle M.D.   On: 10/09/2014 16:01   Dg Chest Port 1 View  10/29/2014   CLINICAL DATA:  Acute chest pain at anterior RIGHT chest, soreness in shortness of breath, symptoms for a few days, history smoking,  hypertension, asthma  EXAM: PORTABLE CHEST - 1 VIEW  COMPARISON:  Portable exam 1719 hours compared to 10/09/2014  FINDINGS: Enlargement of cardiac silhouette.  Atherosclerotic calcification aorta.  Mediastinal contours and pulmonary vascularity normal.  Bronchitic changes with bibasilar atelectasis.  Cannot completely exclude LEFT basilar infiltrate.  Upper lungs clear.  No gross pleural effusion or pneumothorax.  IMPRESSION: Bronchitic changes with bibasilar atelectasis, cannot completely exclude LEFT basilar infiltrate.   Electronically Signed   By: Lavonia Dana M.D.   On: 10/29/2014 17:39    ASSESSMENT AND PLAN  [name] [name] is a 69 y.o. female with a history of tobacco abuse and HTN who was admitted to [facility] yesterday for new onset atrial fibrillation and possible PNA  New onset Atrial Fibrillation: in the setting of possible PNA. Now resolved and maintaining NSR. Tele with NSR with freq PACs and PVCs HR in 60s -- Continue to treat underlying illness.   -- Troponin mildly positive but flat trend 0.07--> 0.07--> 0.07 -- TSH normal -- 2D ECHO yesterday with EF 35-40%, diffuse HK, and severe HK of inferior myocardium.  -- She is on dilt 60mg  BID currently. Would change this to a BB in the setting of LV dysfunction. She is currently on Coreg 3.125mg  BID.  -- CHA2DS2 VASc score is at least 3 (HTN, female sex, Age 64-74) so she was placed on Eliquis 5 mg BID. This was discontinued with anticipated LHC today and she was put on heparin gtt.   New onset cardiomyopathy- 2D ECHO yesterday with EF 35-40%, diffuse HK, and severe HK of inferior myocardium.  WMA on echo suggests CAD as cause of decreased LV function. -- Troponin mildly positive but flat trend 0.07--> 0.07--> 0.07. 14 beat run of NSVT on tele. -- Plan for LHC today. Patient refusing to sign consent. She states that she has had a anaphylactic reaction to contrast dye in the past; however, she is unable to say when or much more. She said her PCP,  Dr. Deforest Hoyles would know. I called him and he will be out of the office until Monday. A nurse looked through he chart and reported no prior documentation of previous contrast induced anaphylaxis. May need to cancel cath and plan for Monday after pre-medication with steroids.  -- Likely the reason for her dyspnea, agree with IV lasix given by IM. Monitors I/Os and K  NSVT- 14 beats noted on tele. This could be ischemic. Recommend LHC as above. Continue BB for now.   Possible PNA: On  antibiotics. Management per IM.   HTN: now well controlled.   Tobacco Abuse: smoking cessation strongly advised.   Hypokalemia- 3.3 supplement    Signed, Eileen Stanford PA-C  Pager [phone]

## 2014-10-31 NOTE — Progress Notes (Signed)
ANTICOAGULATION CONSULT NOTE - Follow Up Consult  Pharmacy Consult for Heparin Indication: atrial fibrillation  Allergies  Allergen Reactions  . Bee Venom Anaphylaxis  . Shrimp [Shellfish Allergy] Swelling    Patient Measurements: Height: 5' 7.5" (171.5 cm) Weight: 214 lb 11.2 oz (97.387 kg) IBW/kg (Calculated) : 62.75 Heparin Dosing Weight: 84 kg  Vital Signs: Temp: 98.2 F (36.8 C) (03/18 0503) Temp Source: Oral (03/18 0503) BP: 180/98 mmHg (03/18 0503) Pulse Rate: 74 (03/18 0503)  Labs:  Recent Labs  10/29/14 1650 10/30/14 0445 10/30/14 1500 10/30/14 1820 10/31/14 0149 10/31/14 0555 10/31/14 1248  HGB 15.5* 14.7  --   --   --  15.0  --   HCT 44.4 43.5  --   --   --  44.5  --   PLT 149* 147*  --   --   --  136*  --   APTT  --   --   --  39*  --  36 56*  LABPROT 12.9  --   --   --   --   --   --   INR 0.96  --   --   --   --   --   --   HEPARINUNFRC  --   --   --  1.76*  --  0.82* 0.76*  CREATININE 1.03 1.06  --   --   --  0.97  --   TROPONINI  --   --  0.07* 0.07* 0.07*  --   --     Estimated Creatinine Clearance: 66.2 mL/min (by C-G formula based on Cr of 0.97).   Assessment: CC: 69 y/o female who presented on 3/16 from PCP office for new onset Afib and URI symptoms.  AC: Heparin for Afib, received 2 doses of apixaban, last 3/17 10:00.  CHADsVasc score of 4 (CHF, female gender, age of 50 and hypertension). Hgb 15 stable. Plts 136. HL 0.76, aPTT 56  ID: CAP. Productive cough with yellow sputum. WBC 8.5. Renal ok. Rocephin/Azithro  CV: Concern for CAD contributing to decreased LV function, plan is for cath this admit. HTN. Runs of Vtach overnight. EF 35-45%. Troponin 0.07 x 2. Meds: ASA81, Coreg, diltiazem po  Endo:Glucose 107  Goal of Therapy:  aPTT 66-102 seconds Monitor platelets by anticoagulation protocol: Yes   Plan:  Increase IV heparin to 1350 units/hr Recheck aPTT in 6 hrs after rate change Plan cath   [employer]. Alford Highland, PharmD,  BCPS Clinical Staff Pharmacist Pager [phone]  [name] [name] 10/31/2014,1:44 PM

## 2014-10-31 NOTE — Progress Notes (Signed)
Notified Dr. Philbert Riser about pt's 14 beat run of Vtach. Pt states that she could feel her heart beating fast during the run. Will continue to closely monitor pt.

## 2014-10-31 NOTE — Progress Notes (Signed)
PROGRESS NOTE  [name] [name] PYK:[id number] DOB: [birth year] DOA: 10/29/2014 PCP: [name], MD  HPI: [name] [name] is a 69 year old female with a past medical history of HTN, asthma, and arthritis who presented to the [facility] ED on 10/29/14 with new onset atrial fibrillation with RVR. Patient was seen by her PCP on the day of admission complaining of 1 week of intermittent chest pain and was found to be in atrial fibrillation with HR in the 150s. EMS was called and patient was transported to [facility].   Since admission patient has spontaneously converted back to normal sinus rhythm. Patient was hypokalemic with potassium of 3.1 otherwise labs are unremarkable. CXR on admission showed bronchitic changes but could not rule out left basilar infiltrate. CXR on 10/30/14 had evidence of possible pulmonary edema. Patient started on Zithromax and Rocephin. Delta troponin was negative. HR decreased to 60s with diltiazem. Started on anticoagulation.   Subjective: Today, [name] [name] is anxious about the cardiac cath procedure as she states she is anaphylactic with contrast. At this time she has not signed consent for the procedure as she has some concerns. Reports some shortness of breath which was worse overnight. Denies chest pain, nausea, vomiting, and diarrhea.    Patient was not able to tolerate laying flat for the cardiac catheterization, came in with shortness of breath and elevated blood pressure.  Assessment/Plan:   Atrial fibrillation - Patient presented with new onset paroxysmal atrial fibrillation with RVR - Patient given diltiazem drip for rate control with last HR 74 bpm - TSH was normal at 1.152 - Troponin negative at 0.04 - CHADsVasc score of 4 (CHF, female gender, age of 23 and hypertension) started on oral Eliquis 5mg  daily - Cardiology consulted and recommend anticoagulation for at least a month with possibility to extend if patient reverts to atrial fibrillation after  respiratory illness is resolved    CAP (community acquired pneumonia) - Patient presented with cough, fatigue, and congestion  - Initial CXR showed bronchitic changes but could not rule out left basilar infiltrate  - Started on Rochepin and Zithromax - Repeat CXR more indicative of pulmonary edema - Patient is afebrile with a temp of 98.17F and has oxygen saturation of 92% on room air - Sputum culture results indicate specimen is not representative of lower lung- suggest recollection. - Will Continue ABX along with diuresis as shortness of breath likely due to heart failure rather than infection. If symptoms improve with diuresis will discontinue ABX.   Cardiomyopathy, likely ischemic cardiomyopathy - 2-D echocardiogram showed ejection fraction of 35-40% with increased filling pressure consistent with diastolic dysfunction. - Patient seen by cardiology, patient agreed for cardiac cath, did not tolerate it secondary to shortness of breath (orthopnea) - Appears asymptomatic was not complaining about orthopnea last night but she did when she went to the cardiac cath. - On Cardizem 60 mg twice a day for rate control of atrial fibrillation, and Coreg 3.125 - Given 1 dose of Lasix on 10/30/14, repeat CXR shows possible pulmonary edema - We'll start scheduled dose of IV Lasix.  Hypokalemia - Patient presented with potassium of 3.1, repleted orally and parenterally  - Potassium today was 3.3, will give total of 80 mEq today - Recheck BMP in the morning   DVT Prophylaxis:  Heparin continuous infusion   Code Status: Full Family Communication: No family at bedside  Disposition Plan: Remain inpatient   Consultants:  Cardiology   Procedures:  Echocardiogram 10/30/14  Cardiac cath  Antibiotics: Anti-infectives    Start     Dose/Rate Route Frequency Ordered Stop   10/29/14 1945  cefTRIAXone (ROCEPHIN) 1 g in dextrose 5 % 50 mL IVPB     1 g 100 mL/hr over 30 Minutes Intravenous Every 24  hours 10/29/14 1940 11/05/14 1944   10/29/14 1945  azithromycin (ZITHROMAX) 500 mg in dextrose 5 % 250 mL IVPB     500 mg 250 mL/hr over 60 Minutes Intravenous Every 24 hours 10/29/14 1940 11/05/14 1944      Objective: Filed Vitals:   10/30/14 1000 10/30/14 1308 10/30/14 2150 10/31/14 0503  BP: 122/108 149/117 149/67 180/98  Pulse:   72 74  Temp:   98 F (36.7 C) 98.2 F (36.8 C)  TempSrc:   Oral Oral  Resp:   16 20  Height:      Weight:    97.387 kg (214 lb 11.2 oz)  SpO2:   92% 92%    Intake/Output Summary (Last 24 hours) at 10/31/14 1108 Last data filed at 10/31/14 0300  Gross per 24 hour  Intake   1080 ml  Output   1451 ml  Net   -371 ml   Filed Weights   10/29/14 2020 10/31/14 0503  Weight: 97.7 kg (215 lb 6.2 oz) 97.387 kg (214 lb 11.2 oz)    Exam: General: Well developed, overweight, patient sitting on edge of the bed NAD, appears stated age  66:  Anicteic Sclera Neck: Supple Cardiovascular: RRR, S1 S2 auscultated, no rubs, murmurs or gallops.   Respiratory: Clear to auscultation bilaterally with equal chest rise- no rales rhonchi or wheezes Abdomen: Soft, nontender, nondistended, + bowel sounds  Extremities: warm dry without cyanosis clubbing or edema.  Neuro: AAOx3 Skin: Without rashes exudates or nodules.   Psych: Normal affect and demeanor with intact judgement and insight   Data Reviewed: Basic Metabolic Panel:  Recent Labs Lab 10/29/14 1650 10/30/14 0445 10/31/14 0555  NA 139 141 141  K 3.1* 3.1* 3.3*  CL 106 109 110  CO2 21 20 25   GLUCOSE 108* 104* 107*  BUN 17 17 12   CREATININE 1.03 1.06 0.97  CALCIUM 9.8 8.9 9.1  MG 1.9  --   --   PHOS 4.0  --   --    Liver Function Tests:  Recent Labs Lab 10/30/14 0445 10/31/14 0555  AST 17 16  ALT 14 14  ALKPHOS 97 100  BILITOT 0.8 0.9  PROT 6.9 7.1  ALBUMIN 3.5 3.4*   CBC:  Recent Labs Lab 10/29/14 1650 10/30/14 0445 10/31/14 0555  WBC 8.6 9.4 8.5  NEUTROABS  --  6.0  --     HGB 15.5* 14.7 15.0  HCT 44.4 43.5 44.5  MCV 86.5 87.0 87.4  PLT 149* 147* 136*   Cardiac Enzymes:  Recent Labs Lab 10/30/14 1500 10/30/14 1820 10/31/14 0149  TROPONINI 0.07* 0.07* 0.07*   BNP (last 3 results)  Recent Labs  10/30/14 1703  BNP 202.1*   CBG:  Recent Labs Lab 10/30/14 1218  GLUCAP 105*    Recent Results (from the past 240 hour(s))  Culture, blood (routine x 2) Call MD if unable to obtain prior to antibiotics being given     Status: None (Preliminary result)   Collection Time: 10/29/14 10:10 PM  Result Value Ref Range Status   Specimen Description BLOOD LEFT WRIST  Final   Special Requests BOTTLES DRAWN AEROBIC ONLY 5CC  Final   Culture   Final  BLOOD CULTURE RECEIVED NO GROWTH TO DATE CULTURE WILL BE HELD FOR 5 DAYS BEFORE ISSUING A FINAL NEGATIVE REPORT Performed at [facility]    Report Status PENDING  Incomplete  Culture, blood (routine x 2) Call MD if unable to obtain prior to antibiotics being given     Status: None (Preliminary result)   Collection Time: 10/29/14 10:15 PM  Result Value Ref Range Status   Specimen Description BLOOD LT HAND  Final   Special Requests BOTTLES DRAWN AEROBIC ONLY [place]  Final   Culture   Final           BLOOD CULTURE RECEIVED NO GROWTH TO DATE CULTURE WILL BE HELD FOR 5 DAYS BEFORE ISSUING A FINAL NEGATIVE REPORT Performed at [facility]    Report Status PENDING  Incomplete  Culture, sputum-assessment     Status: None   Collection Time: 10/30/14  5:51 PM  Result Value Ref Range Status   Specimen Description SPUTUM  Final   Special Requests NONE  Final   Sputum evaluation   Final    MICROSCOPIC FINDINGS SUGGEST THAT THIS SPECIMEN IS NOT REPRESENTATIVE OF LOWER RESPIRATORY SECRETIONS. PLEASE RECOLLECT. Rosalie Gums RN [id number] 10/30/14 A BROWNING    Report Status 10/30/2014 FINAL  Final     Studies: Dg Chest 2 View  10/30/2014   CLINICAL DATA:  Shortness of breath.  EXAM: CHEST  2  VIEW  COMPARISON:  October 29, 2014.  FINDINGS: Stable cardiomediastinal silhouette. No pneumothorax or pleural effusion is noted. Slightly increased interstitial densities are noted in both lower lobes concerning for pulmonary edema. Bony thorax appears to be intact.  IMPRESSION: Slightly increased bilateral lower lobe interstitial densities are noted concerning for pulmonary edema.   Electronically Signed   By: Marijo Conception, M.D.   On: 10/30/2014 17:48   Dg Chest Port 1 View  10/29/2014   CLINICAL DATA:  Acute chest pain at anterior RIGHT chest, soreness in shortness of breath, symptoms for a few days, history smoking, hypertension, asthma  EXAM: PORTABLE CHEST - 1 VIEW  COMPARISON:  Portable exam 1719 hours compared to 10/09/2014  FINDINGS: Enlargement of cardiac silhouette.  Atherosclerotic calcification aorta.  Mediastinal contours and pulmonary vascularity normal.  Bronchitic changes with bibasilar atelectasis.  Cannot completely exclude LEFT basilar infiltrate.  Upper lungs clear.  No gross pleural effusion or pneumothorax.  IMPRESSION: Bronchitic changes with bibasilar atelectasis, cannot completely exclude LEFT basilar infiltrate.   Electronically Signed   By: Lavonia Dana M.D.   On: 10/29/2014 17:39    Scheduled Meds: . aspirin EC  81 mg Oral Daily  . azithromycin  500 mg Intravenous Q24H  . carvedilol  3.125 mg Oral BID WC  . cefTRIAXone (ROCEPHIN)  IV  1 g Intravenous Q24H  . diltiazem  60 mg Oral Q12H  . guaiFENesin  1,200 mg Oral BID  . sodium chloride  3 mL Intravenous Q12H   Continuous Infusions: . sodium chloride Stopped (10/31/14 4132)  . heparin 1,200 Units/hr (10/31/14 4401)    Active Problems:   HTN (hypertension)   Atrial fibrillation   CAP (community acquired pneumonia)   Cardiomyopathy   Hypokalemia   Acute on chronic combined systolic and diastolic HF (heart failure)   Acute chest pain   SOB (shortness of breath)    Raspect, Erin, PA-S [facility] 10/31/2014, 11:08 AM    Addendum  Patient seen and examined, chart and data base reviewed.  I agree with the above  assessment and plan.  For full details please see Mrs. Raspect, Erin, PA-S note.  I reviewed and amended the above note  as appropriate.   Birdie Hopes, MD [facility] Pager: [phone] 10/31/2014, 2:58 PM

## 2014-10-31 NOTE — Progress Notes (Signed)
Patient returned from [facility] lab extremely SOB & anxious/fidgety. CATH was unsuccessful due to patient not being able to lay flat from anxiety/SOB. Respirations = 36., BP= 206/117. Patient vomiting. Fine crackles noted in lung bases. MD  & PA aware and notified to obtain PRN anti-anxiety medicine/other orders. Will continue to monitor.

## 2014-10-31 NOTE — Progress Notes (Signed)
Pt is on the cardiac cath schedule for 3/18 and cath orders are in. Pt states that she did not agree to a cardiac catheterization and she will not agree to the procedure until she speaks with her primary physician. Pt will not sign consent form. Dr. Philbert Riser notified about pt's decision. Pt has said that she will discuss the procedure with MD in the morning and make her decision at that time.

## 2014-10-31 NOTE — Progress Notes (Signed)
    Called by RN because patient hypertensive, SOB and tachypnic. She came back from cath lab without procedure as she was unable to lay flat due to SOB. She is very anxious- hyperventilating. Also with some fine crackles at lung bases. Did get some IV lasix earlier today. Will give ativan and 20mg  IV lasix 1x to see if this helps.  Angelena Form PA-C  [facility]

## 2014-10-31 NOTE — Progress Notes (Signed)
ANTICOAGULATION CONSULT NOTE - Follow Up Consult  Pharmacy Consult for Heparin (apixaban on hold) Indication: atrial fibrillation  Allergies  Allergen Reactions  . Bee Venom Anaphylaxis  . Shrimp [Shellfish Allergy] Swelling    Patient Measurements: Height: 5' 7.5" (171.5 cm) Weight: 214 lb 11.2 oz (97.387 kg) IBW/kg (Calculated) : 62.75  Vital Signs: Temp: 98.2 F (36.8 C) (03/18 0503) Temp Source: Oral (03/18 0503) BP: 180/98 mmHg (03/18 0503) Pulse Rate: 74 (03/18 0503)  Labs:  Recent Labs  10/29/14 1650 10/30/14 0445 10/30/14 1500 10/30/14 1820 10/31/14 0149 10/31/14 0555  HGB 15.5* 14.7  --   --   --  15.0  HCT 44.4 43.5  --   --   --  44.5  PLT 149* 147*  --   --   --  136*  APTT  --   --   --  39*  --  36  LABPROT 12.9  --   --   --   --   --   INR 0.96  --   --   --   --   --   HEPARINUNFRC  --   --   --  1.76*  --  0.82*  CREATININE 1.03 1.06  --   --   --   --   TROPONINI  --   --  0.07* 0.07* 0.07*  --     Estimated Creatinine Clearance: 60.6 mL/min (by C-G formula based on Cr of 1.06).  Assessment: Sub-therapeutic aPTT (using aPTT to guide dosing for now given apixaban influence on heparin levels). Heparin was OFF for around 1 hour just before labs were drawn.   Goal of Therapy:  Heparin level 0.3-0.7 units/ml aPTT 66-102 seconds Monitor platelets by anticoagulation protocol: Yes   Plan:  -Continue heparin at 1200 units/hr for now given off time just before labs  -1300 HL/aPTT -Daily CBC/HL/aPTT -Monitor for bleeding  Narda Bonds 10/31/2014,7:05 AM

## 2014-10-31 NOTE — Progress Notes (Signed)
ANTICOAGULATION CONSULT NOTE - Follow Up Consult  Pharmacy Consult for Heparin Indication: atrial fibrillation  Allergies  Allergen Reactions  . Bee Venom Anaphylaxis  . Shrimp [Shellfish Allergy] Swelling    Patient Measurements: Height: 5' 7.5" (171.5 cm) Weight: 214 lb 11.2 oz (97.387 kg) IBW/kg (Calculated) : 62.75 Heparin Dosing Weight: 84 kg  Vital Signs: Temp: 98 F (36.7 C) (03/18 2005) Temp Source: Oral (03/18 2005) BP: 157/87 mmHg (03/18 2005) Pulse Rate: 79 (03/18 2005)  Labs:  Recent Labs  10/29/14 1650 10/30/14 0445 10/30/14 1500  10/30/14 1820 10/31/14 0149 10/31/14 0555 10/31/14 1248 10/31/14 2223  HGB 15.5* 14.7  --   --   --   --  15.0  --   --   HCT 44.4 43.5  --   --   --   --  44.5  --   --   PLT 149* 147*  --   --   --   --  136*  --   --   APTT  --   --   --   < > 39*  --  36 56* 76*  LABPROT 12.9  --   --   --   --   --   --   --   --   INR 0.96  --   --   --   --   --   --   --   --   HEPARINUNFRC  --   --   --   < > 1.76*  --  0.82* 0.76* 0.83*  CREATININE 1.03 1.06  --   --   --   --  0.97  --   --   TROPONINI  --   --  0.07*  --  0.07* 0.07*  --   --   --   < > = values in this interval not displayed.  Estimated Creatinine Clearance: 66.2 mL/min (by C-G formula based on Cr of 0.97).   Assessment: 69 y/o female who presented on 3/16 from PCP office for new onset Afib and URI symptoms.  Patient received 2 doses of apixaban, last 3/17 10:00, for afib but was transitioned to heparin with plans for cardiac cath (elevated troponin).   CHADsVasc score of 4 (CHF, female gender, age of 32 and hypertension).   Patient is therapeutic on heparin at 1350 units/hr based on aPTT.  Heparin level remains slightly elevated likely related to apixaban use.  Would anticipate it to trend down overnight and we can begin to use Heparin levels only to monitor anticoagulation.  Goal of Therapy:  aPTT 66-102 seconds Monitor platelets by anticoagulation  protocol: Yes   Plan:  Continue heparin at 1350 units/hr. Follow up with heparin level and aPTT in AM. Daily CBC while on heparin. Follow-up with cath plans for possibly Monday?  [employer], Pharm.D., BCPS Clinical Pharmacist Pager [phone] 10/31/2014 11:01 PM

## 2014-11-01 DIAGNOSIS — I4891 Unspecified atrial fibrillation: Secondary | ICD-10-CM | POA: Diagnosis present

## 2014-11-01 LAB — COMPREHENSIVE METABOLIC PANEL
ALT: 15 U/L (ref 0–35)
AST: 19 U/L (ref 0–37)
Albumin: 3.6 g/dL (ref 3.5–5.2)
Alkaline Phosphatase: 96 U/L (ref 39–117)
Anion gap: 9 (ref 5–15)
BUN: 20 mg/dL (ref 6–23)
CO2: 22 mmol/L (ref 19–32)
Calcium: 9.6 mg/dL (ref 8.4–10.5)
Chloride: 108 mmol/L (ref 96–112)
Creatinine, Ser: 1.31 mg/dL — ABNORMAL HIGH (ref 0.50–1.10)
GFR calc Af Amer: 47 mL/min — ABNORMAL LOW (ref >90)
GFR calc non Af Amer: 41 mL/min — ABNORMAL LOW (ref >90)
Glucose, Bld: 176 mg/dL — ABNORMAL HIGH (ref 70–99)
Potassium: 3.8 mmol/L (ref 3.5–5.1)
Sodium: 139 mmol/L (ref 135–145)
Total Bilirubin: 0.6 mg/dL (ref 0.3–1.2)
Total Protein: 7.8 g/dL (ref 6.0–8.3)

## 2014-11-01 LAB — HEPARIN LEVEL (UNFRACTIONATED)
Heparin Unfractionated: 0.56 IU/mL (ref 0.30–0.70)
Heparin Unfractionated: 0.77 IU/mL — ABNORMAL HIGH (ref 0.30–0.70)

## 2014-11-01 LAB — CBC
HCT: 45 % (ref 36.0–46.0)
Hemoglobin: 15.1 g/dL — ABNORMAL HIGH (ref 12.0–15.0)
MCH: 29.4 pg (ref 26.0–34.0)
MCHC: 33.6 g/dL (ref 30.0–36.0)
MCV: 87.7 fL (ref 78.0–100.0)
Platelets: 145 10*3/uL — ABNORMAL LOW (ref 150–400)
RBC: 5.13 MIL/uL — ABNORMAL HIGH (ref 3.87–5.11)
RDW: 15.6 % — ABNORMAL HIGH (ref 11.5–15.5)
WBC: 14.6 10*3/uL — ABNORMAL HIGH (ref 4.0–10.5)

## 2014-11-01 LAB — APTT: aPTT: 72 seconds — ABNORMAL HIGH (ref 24–37)

## 2014-11-01 MED ORDER — CARVEDILOL 6.25 MG PO TABS
6.2500 mg | ORAL_TABLET | Freq: Two times a day (BID) | ORAL | Status: DC
  Administered 2014-11-01 – 2014-11-02 (×2): 6.25 mg via ORAL
  Filled 2014-11-01 (×2): qty 1

## 2014-11-01 MED ORDER — AMIODARONE LOAD VIA INFUSION
150.0000 mg | Freq: Once | INTRAVENOUS | Status: AC
  Administered 2014-11-01: 150 mg via INTRAVENOUS
  Filled 2014-11-01: qty 83.34

## 2014-11-01 MED ORDER — AMIODARONE HCL IN DEXTROSE 360-4.14 MG/200ML-% IV SOLN
30.0000 mg/h | INTRAVENOUS | Status: DC
  Administered 2014-11-01 – 2014-11-02 (×2): 30 mg/h via INTRAVENOUS
  Filled 2014-11-01: qty 200

## 2014-11-01 MED ORDER — AMIODARONE HCL IN DEXTROSE 360-4.14 MG/200ML-% IV SOLN
60.0000 mg/h | INTRAVENOUS | Status: AC
  Administered 2014-11-01: 60 mg/h via INTRAVENOUS
  Filled 2014-11-01 (×2): qty 200

## 2014-11-01 MED ORDER — HYDROCODONE-ACETAMINOPHEN 5-325 MG PO TABS: 1.0000 | ORAL_TABLET | Freq: Four times a day (QID) | ORAL | Status: DC | PRN

## 2014-11-01 MED ORDER — HEPARIN (PORCINE) IN NACL 100-0.45 UNIT/ML-% IJ SOLN
1300.0000 [IU]/h | INTRAMUSCULAR | Status: DC
  Administered 2014-11-01 – 2014-11-02 (×3): 1250 [IU]/h via INTRAVENOUS
  Filled 2014-11-01 (×2): qty 250

## 2014-11-01 NOTE — Progress Notes (Signed)
Pt has not shown agitation for the shift so far. No need for IV ativan every 4 hours at this time. Pt requesting to go home and maybe return Monday for her CATH procedure. RN advised against this and suggested ativan before procedure in addition to meds for contrast allergy in order for patient to be able to tolerate procedure better. Patient said she will think about it.

## 2014-11-01 NOTE — Progress Notes (Addendum)
ANTICOAGULATION CONSULT NOTE - Follow Up Consult  Pharmacy Consult for Heparin Indication: atrial fibrillation  Allergies  Allergen Reactions  . Bee Venom Anaphylaxis  . Shrimp [Shellfish Allergy] Swelling    Patient Measurements: Height: 5' 7.5" (171.5 cm) Weight: 213 lb 3.2 oz (96.707 kg) IBW/kg (Calculated) : 62.75 Heparin Dosing Weight: 84 kg  Vital Signs: Temp: 98 F (36.7 C) (03/19 0500) Temp Source: Oral (03/19 0500) BP: 150/72 mmHg (03/19 0500) Pulse Rate: 69 (03/19 0500)  Labs:  Recent Labs  10/29/14 1650 10/30/14 0445 10/30/14 1500  10/30/14 1820 10/31/14 0149 10/31/14 0555 10/31/14 1248 10/31/14 2223 11/01/14 0425  HGB 15.5* 14.7  --   --   --   --  15.0  --   --  15.1*  HCT 44.4 43.5  --   --   --   --  44.5  --   --  45.0  PLT 149* 147*  --   --   --   --  136*  --   --  145*  APTT  --   --   --   < > 39*  --  36 56* 76* 72*  LABPROT 12.9  --   --   --   --   --   --   --   --   --   INR 0.96  --   --   --   --   --   --   --   --   --   HEPARINUNFRC  --   --   --   < > 1.76*  --  0.82* 0.76* 0.83* 0.56  CREATININE 1.03 1.06  --   --   --   --  0.97  --   --  1.31*  TROPONINI  --   --  0.07*  --  0.07* 0.07*  --   --   --   --   < > = values in this interval not displayed.  Estimated Creatinine Clearance: 48.9 mL/min (by C-G formula based on Cr of 1.31).   Assessment: 69 y/o female who presented on 3/16 from PCP office for new onset Afib and URI symptoms.  Patient received 2 doses of apixaban, last 3/17 10:00, for afib but was transitioned to heparin with plans for cardiac cath (elevated troponin).   CHADsVasc score of 4 (CHF, female gender, age of 48 and hypertension).   Patient is therapeutic on heparin at 1350 units/hr based on aPTT & Heparin level this AM.  Heparin level correlating with aPTT, so will monitor going forward by heparin level.   Goal of Therapy:  aPTT 66-102 seconds  Heparin level 0.3 to 0.7 units/mL Monitor platelets by  anticoagulation protocol: Yes   Plan:  Continue heparin at 1350 units/hr. Follow up with heparin level in 6 hrs to confirm. Daily Heparin level & CBC while on heparin. Follow-up with cath plans for possibly Monday?  Sloan Leiter, PharmD, BCPS Clinical Pharmacist [phone]  11/01/2014 7:24 AM   Addendum: Confirmatory heparin level is increased and now above goal at 0.77. Will decrease rate to 1250 units/hr and check another 6 hour heparin level.  Nena Jordan, PharmD, BCPS 11/01/2014, 6:01 PM

## 2014-11-01 NOTE — Progress Notes (Addendum)
PROGRESS NOTE  [name] [name]:[id number] DOB: September 06, 1945 DOA: 10/29/2014 PCP: [name], MD  HPI: [name] [name] is a 69 year old female with a past medical history of HTN, asthma, and arthritis who presented to the [facility] ED on 10/29/14 with new onset atrial fibrillation with RVR. Patient was seen by her PCP on the day of admission complaining of 1 week of intermittent chest pain and was found to be in atrial fibrillation with HR in the 150s. EMS was called and patient was transported to [facility].   Since admission patient has spontaneously converted back to normal sinus rhythm. Patient was hypokalemic with potassium of 3.1 otherwise labs are unremarkable. CXR on admission showed bronchitic changes but could not rule out left basilar infiltrate. CXR on 10/30/14 had evidence of possible pulmonary edema. Patient started on Zithromax and Rocephin. Delta troponin was negative. HR decreased to 60s with diltiazem. Started on anticoagulation.   Subjective: Cardiac cath was not done yesterday because of patient panic attack and shortness of breath. Feels better today, denies any chest pain. Heart rate is in the 130s  Assessment/Plan:   Atrial fibrillation - Patient presented with new onset paroxysmal atrial fibrillation with RVR - Patient given diltiazem drip for rate control with last HR 74 bpm - TSH was normal at 1.152 - Troponin negative at 0.04 - CHADsVasc score of 4 (CHF, female gender, age of 22 and hypertension) started on oral Eliquis 5mg  daily - Currently anticoagulated with heparin, heart rate is not controlled. - On 60 mg of diltiazem and 3.125 mg of Coreg, I will increase Coreg to 6.25 and monitor blood pressure closely.   CAP (community acquired pneumonia) - Patient presented with cough, fatigue, and congestion  - Initial CXR showed bronchitic changes but could not rule out left basilar infiltrate  - Started on Rochepin and Zithromax - Patient did not have fever,  leukocytosis or productive cough, x-rays now look more like fluids. - I will discontinue antibiotics, monitor for fever or worsening.  Cardiomyopathy, likely ischemic cardiomyopathy - 2-D echocardiogram showed ejection fraction of 35-40% with increased filling pressure consistent with diastolic dysfunction. - Patient seen by cardiology, patient agreed for cardiac cath, did not tolerate it secondary to shortness of breath (orthopnea) - Appears asymptomatic was not complaining about orthopnea last night but she did when she went to the cardiac cath. - On Cardizem 60 mg twice a day for rate control of atrial fibrillation, and Coreg 3.125 - Given 1 dose of Lasix on 10/30/14, repeat CXR shows possible pulmonary edema - Given around 100 mg of Lasix yesterday, creatinine of 1.3, hold Lasix for today.  Hypokalemia - Potassium improved after oral placement.  DVT Prophylaxis:  Heparin continuous infusion   Code Status: Full Family Communication: No family at bedside  Disposition Plan: Remain inpatient   Consultants:  Cardiology   Procedures:  Echocardiogram 10/30/14  Cardiac cath   Antibiotics: Anti-infectives    Start     Dose/Rate Route Frequency Ordered Stop   10/29/14 1945  cefTRIAXone (ROCEPHIN) 1 g in dextrose 5 % 50 mL IVPB     1 g 100 mL/hr over 30 Minutes Intravenous Every 24 hours 10/29/14 1940 11/05/14 1944   10/29/14 1945  azithromycin (ZITHROMAX) 500 mg in dextrose 5 % 250 mL IVPB     500 mg 250 mL/hr over 60 Minutes Intravenous Every 24 hours 10/29/14 1940 11/05/14 1944      Objective: Filed Vitals:   10/31/14 1800 10/31/14 2005 11/01/14 0500  11/01/14 0820  BP: 146/67 157/87 150/72 161/98  Pulse: 71 79 69 37  Temp:  98 F (36.7 C) 98 F (36.7 C)   TempSrc:  Oral Oral   Resp: 18 17 20    Height:      Weight:   96.707 kg (213 lb 3.2 oz)   SpO2: 95% 96% 95% 96%    Intake/Output Summary (Last 24 hours) at 11/01/14 1146 Last data filed at 11/01/14 0830  Gross  per 24 hour  Intake    480 ml  Output   1600 ml  Net  -1120 ml   Filed Weights   10/29/14 2020 10/31/14 0503 11/01/14 0500  Weight: 97.7 kg (215 lb 6.2 oz) 97.387 kg (214 lb 11.2 oz) 96.707 kg (213 lb 3.2 oz)    Exam: General: Well developed, overweight, patient sitting on edge of the bed NAD, appears stated age  29:  Anicteic Sclera Neck: Supple Cardiovascular: RRR, S1 S2 auscultated, no rubs, murmurs or gallops.   Respiratory: Clear to auscultation bilaterally with equal chest rise- no rales rhonchi or wheezes Abdomen: Soft, nontender, nondistended, + bowel sounds  Extremities: warm dry without cyanosis clubbing or edema.  Neuro: AAOx3 Skin: Without rashes exudates or nodules.   Psych: Normal affect and demeanor with intact judgement and insight   Data Reviewed: Basic Metabolic Panel:  Recent Labs Lab 10/29/14 1650 10/30/14 0445 10/31/14 0555 11/01/14 0425  NA 139 141 141 139  K 3.1* 3.1* 3.3* 3.8  CL 106 109 110 108  CO2 21 20 25 22   GLUCOSE 108* 104* 107* 176*  BUN 17 17 12 20   CREATININE 1.03 1.06 0.97 1.31*  CALCIUM 9.8 8.9 9.1 9.6  MG 1.9  --   --   --   PHOS 4.0  --   --   --    Liver Function Tests:  Recent Labs Lab 10/30/14 0445 10/31/14 0555 11/01/14 0425  AST 17 16 19   ALT 14 14 15   ALKPHOS 97 100 96  BILITOT 0.8 0.9 0.6  PROT 6.9 7.1 7.8  ALBUMIN 3.5 3.4* 3.6   CBC:  Recent Labs Lab 10/29/14 1650 10/30/14 0445 10/31/14 0555 11/01/14 0425  WBC 8.6 9.4 8.5 14.6*  NEUTROABS  --  6.0  --   --   HGB 15.5* 14.7 15.0 15.1*  HCT 44.4 43.5 44.5 45.0  MCV 86.5 87.0 87.4 87.7  PLT 149* 147* 136* 145*   Cardiac Enzymes:  Recent Labs Lab 10/30/14 1500 10/30/14 1820 10/31/14 0149  TROPONINI 0.07* 0.07* 0.07*   BNP (last 3 results)  Recent Labs  10/30/14 1703  BNP 202.1*   CBG:  Recent Labs Lab 10/30/14 1218  GLUCAP 105*    Recent Results (from the past 240 hour(s))  Culture, blood (routine x 2) Call MD if unable to  obtain prior to antibiotics being given     Status: None (Preliminary result)   Collection Time: 10/29/14 10:10 PM  Result Value Ref Range Status   Specimen Description BLOOD LEFT WRIST  Final   Special Requests BOTTLES DRAWN AEROBIC ONLY 5CC  Final   Culture   Final           BLOOD CULTURE RECEIVED NO GROWTH TO DATE CULTURE WILL BE HELD FOR 5 DAYS BEFORE ISSUING A FINAL NEGATIVE REPORT Performed at [facility]    Report Status PENDING  Incomplete  Culture, blood (routine x 2) Call MD if unable to obtain prior to antibiotics being given  Status: None (Preliminary result)   Collection Time: 10/29/14 10:15 PM  Result Value Ref Range Status   Specimen Description BLOOD LT HAND  Final   Special Requests BOTTLES DRAWN AEROBIC ONLY [place]  Final   Culture   Final           BLOOD CULTURE RECEIVED NO GROWTH TO DATE CULTURE WILL BE HELD FOR 5 DAYS BEFORE ISSUING A FINAL NEGATIVE REPORT Performed at [facility]    Report Status PENDING  Incomplete  Culture, sputum-assessment     Status: None   Collection Time: 10/30/14  5:51 PM  Result Value Ref Range Status   Specimen Description SPUTUM  Final   Special Requests NONE  Final   Sputum evaluation   Final    MICROSCOPIC FINDINGS SUGGEST THAT THIS SPECIMEN IS NOT REPRESENTATIVE OF LOWER RESPIRATORY SECRETIONS. PLEASE RECOLLECT. Rosalie Gums RN [phone] 10/30/14 A BROWNING    Report Status 10/30/2014 FINAL  Final     Studies: Dg Chest 2 View  10/30/2014   CLINICAL DATA:  Shortness of breath.  EXAM: CHEST  2 VIEW  COMPARISON:  October 29, 2014.  FINDINGS: Stable cardiomediastinal silhouette. No pneumothorax or pleural effusion is noted. Slightly increased interstitial densities are noted in both lower lobes concerning for pulmonary edema. Bony thorax appears to be intact.  IMPRESSION: Slightly increased bilateral lower lobe interstitial densities are noted concerning for pulmonary edema.   Electronically Signed   By: Marijo Conception,  M.D.   On: 10/30/2014 17:48    Scheduled Meds: . aspirin EC  81 mg Oral Daily  . azithromycin  500 mg Intravenous Q24H  . carvedilol  3.125 mg Oral BID WC  . cefTRIAXone (ROCEPHIN)  IV  1 g Intravenous Q24H  . diltiazem  60 mg Oral Q12H  . guaiFENesin  1,200 mg Oral BID  . LORazepam  2 mg Intravenous Q4H  . sodium chloride  3 mL Intravenous Q12H   Continuous Infusions: . heparin 1,350 Units/hr (11/01/14 0159)    Active Problems:   HTN (hypertension)   Atrial fibrillation   CAP (community acquired pneumonia)   Cardiomyopathy   Hypokalemia   Acute on chronic combined systolic and diastolic HF (heart failure)   Acute chest pain   SOB (shortness of breath)   Birdie Hopes, MD [facility] Pager: [phone] 11/01/2014, 11:46 AM

## 2014-11-01 NOTE — Progress Notes (Signed)
SUBJECTIVE:  Denies any chest pain, SOB improved  OBJECTIVE:   Vitals:   Filed Vitals:   10/31/14 1800 10/31/14 2005 11/01/14 0500 11/01/14 0820  BP: 146/67 157/87 150/72 161/98  Pulse: 71 79 69 37  Temp:  98 F (36.7 C) 98 F (36.7 C)   TempSrc:  Oral Oral   Resp: 18 17 20    Height:      Weight:   213 lb 3.2 oz (96.707 kg)   SpO2: 95% 96% 95% 96%   I&O's:   Intake/Output Summary (Last 24 hours) at 11/01/14 1339 Last data filed at 11/01/14 1100  Gross per 24 hour  Intake    480 ml  Output   1700 ml  Net  -1220 ml   TELEMETRY: Reviewed telemetry pt in atrial fibrillation:     PHYSICAL EXAM General: Well developed, well nourished, in no acute distress Head: Eyes PERRLA, No xanthomas.   Normal cephalic and atramatic  Lungs:   Clear bilaterally to auscultation and percussion. Heart:   Irregularly irregular tachy  S1 S2 Pulses are 2+ & equal. Abdomen: Bowel sounds are positive, abdomen soft and non-tender without masses Extremities:   No clubbing, cyanosis or edema.  DP +1 Neuro: Alert and oriented X 3. Psych:  Good affect, responds appropriately   LABS: Basic Metabolic Panel:  Recent Labs  10/29/14 1650  10/31/14 0555 11/01/14 0425  NA 139  < > 141 139  K 3.1*  < > 3.3* 3.8  CL 106  < > 110 108  CO2 21  < > 25 22  GLUCOSE 108*  < > 107* 176*  BUN 17  < > 12 20  CREATININE 1.03  < > 0.97 1.31*  CALCIUM 9.8  < > 9.1 9.6  MG 1.9  --   --   --   PHOS 4.0  --   --   --   < > = values in this interval not displayed. Liver Function Tests:  Recent Labs  10/31/14 0555 11/01/14 0425  AST 16 19  ALT 14 15  ALKPHOS 100 96  BILITOT 0.9 0.6  PROT 7.1 7.8  ALBUMIN 3.4* 3.6   No results for input(s): LIPASE, AMYLASE in the last 72 hours. CBC:  Recent Labs  10/30/14 0445 10/31/14 0555 11/01/14 0425  WBC 9.4 8.5 14.6*  NEUTROABS 6.0  --   --   HGB 14.7 15.0 15.1*  HCT 43.5 44.5 45.0  MCV 87.0 87.4 87.7  PLT 147* 136* 145*   Cardiac  Enzymes:  Recent Labs  10/30/14 1500 10/30/14 1820 10/31/14 0149  TROPONINI 0.07* 0.07* 0.07*   BNP: Invalid input(s): POCBNP D-Dimer: No results for input(s): DDIMER in the last 72 hours. Hemoglobin A1C: No results for input(s): HGBA1C in the last 72 hours. Fasting Lipid Panel: No results for input(s): CHOL, HDL, LDLCALC, TRIG, CHOLHDL, LDLDIRECT in the last 72 hours. Thyroid Function Tests:  Recent Labs  10/29/14 1650  TSH 1.152   Anemia Panel: No results for input(s): VITAMINB12, FOLATE, FERRITIN, TIBC, IRON, RETICCTPCT in the last 72 hours. Coag Panel:   Lab Results  Component Value Date   INR 0.96 10/29/2014    RADIOLOGY: Dg Chest 2 View  10/30/2014   CLINICAL DATA:  Shortness of breath.  EXAM: CHEST  2 VIEW  COMPARISON:  October 29, 2014.  FINDINGS: Stable cardiomediastinal silhouette. No pneumothorax or pleural effusion is noted. Slightly increased interstitial densities are noted in both lower lobes concerning for pulmonary edema. Bony  thorax appears to be intact.  IMPRESSION: Slightly increased bilateral lower lobe interstitial densities are noted concerning for pulmonary edema.   Electronically Signed   By: Marijo Conception, M.D.   On: 10/30/2014 17:48   Dg Chest 2 View  10/09/2014   CLINICAL DATA:  2-3  week history of chest congestion and head cold.  EXAM: CHEST - 2 VIEW  COMPARISON:  Two-view chest x-ray 09/25/2008  FINDINGS: The heart size is normal. Emphysematous changes are evident as before. Mild interstitial coarsening is stable. No focal airspace disease is evident. There is no edema or effusion to suggest failure. There is moderate tortuosity of aorta. Atherosclerotic changes are present at the arch.  IMPRESSION: 1. No acute cardiopulmonary disease or significant interval change. 2. Atherosclerosis. 3. Emphysema.   Electronically Signed   By: San Morelle M.D.   On: 10/09/2014 16:01   Dg Chest Port 1 View  10/29/2014   CLINICAL DATA:  Acute chest pain  at anterior RIGHT chest, soreness in shortness of breath, symptoms for a few days, history smoking, hypertension, asthma  EXAM: PORTABLE CHEST - 1 VIEW  COMPARISON:  Portable exam 1719 hours compared to 10/09/2014  FINDINGS: Enlargement of cardiac silhouette.  Atherosclerotic calcification aorta.  Mediastinal contours and pulmonary vascularity normal.  Bronchitic changes with bibasilar atelectasis.  Cannot completely exclude LEFT basilar infiltrate.  Upper lungs clear.  No gross pleural effusion or pneumothorax.  IMPRESSION: Bronchitic changes with bibasilar atelectasis, cannot completely exclude LEFT basilar infiltrate.   Electronically Signed   By: Lavonia Dana M.D.   On: 10/29/2014 17:39   ASSESSMENT AND PLAN  [name] [name] is a 69 y.o. female with a history of tobacco abuse and HTN who was admitted to [facility] yesterday for new onset atrial fibrillation and possible PNA  New onset Atrial Fibrillation: in the setting of possible PNA. Initially resolved back to NSR but now back in afib with RVR despite BB.   -- Continue to treat underlying illness.  -- Troponin mildly positive but flat trend 0.07--> 0.07--> 0.07 -- TSH normal -- 2D ECHO yesterday with EF 35-40%, diffuse HK, and severe HK of inferior myocardium.  -- Diltiazem changed to BB in the setting of LV dysfunction. Increase Coreg to 6.25mg  BID.  Will start IV Amio gtt to get HR under better control.  Need to avoid CCB with LV dysfunction.   -- CHA2DS2 VASc score is at least 3 (HTN, female sex, Age 70-74) so she was placed on Eliquis 5 mg BID. This was discontinued with anticipated LHC today and she was put on heparin gtt.   New onset cardiomyopathy- 2D ECHO  with EF 35-40%, diffuse HK, and severe HK of inferior myocardium. WMA on echo suggests CAD as cause of decreased LV function. -- Troponin mildly positive but flat trend 0.07--> 0.07--> 0.07. 14 beat run of NSVT on tele. --  Patient refusing to sign consent. She states that she has had a  anaphylactic reaction to contrast dye in the past; however, she is unable to say when or much more. She said her PCP, Dr. Deforest Hoyles would know. I called him and he will be out of the office until Monday. A nurse looked through he chart and reported no prior documentation of previous contrast induced anaphylaxis. Cath cancelled yesterday and plan for Monday after pre-medication with steroids.  -- Likely the reason for her dyspnea, agree with IV lasix given by IM. Monitors I/Os and K -- continue BB.  Hold on ACEi  or ARB due to slight bump in creatinine with diuresis.  Start after cath if renal function stable.   NSVT- 14 beats noted on tele. This could be ischemic. Recommend LHC as above. Continue BB for now.   Possible PNA: On antibiotics. Management per IM.   HTN: now well controlled.   Tobacco Abuse: smoking cessation strongly advised.   Hypokalemia- repleted   Lurena Nida, MD  11/01/2014  1:39 PM

## 2014-11-02 ENCOUNTER — Inpatient Hospital Stay (HOSPITAL_COMMUNITY): Payer: Commercial Managed Care - HMO

## 2014-11-02 LAB — BASIC METABOLIC PANEL
Anion gap: 11 (ref 5–15)
BUN: 28 mg/dL — ABNORMAL HIGH (ref 6–23)
CO2: 23 mmol/L (ref 19–32)
Calcium: 9.2 mg/dL (ref 8.4–10.5)
Chloride: 106 mmol/L (ref 96–112)
Creatinine, Ser: 1.24 mg/dL — ABNORMAL HIGH (ref 0.50–1.10)
GFR calc Af Amer: 50 mL/min — ABNORMAL LOW (ref >90)
GFR calc non Af Amer: 43 mL/min — ABNORMAL LOW (ref >90)
Glucose, Bld: 126 mg/dL — ABNORMAL HIGH (ref 70–99)
Potassium: 3.2 mmol/L — ABNORMAL LOW (ref 3.5–5.1)
Sodium: 140 mmol/L (ref 135–145)

## 2014-11-02 LAB — CBC
HCT: 43.5 % (ref 36.0–46.0)
HCT: 43 % (ref 36.0–46.0)
Hemoglobin: 14.3 g/dL (ref 12.0–15.0)
Hemoglobin: 14.3 g/dL (ref 12.0–15.0)
MCH: 29.4 pg (ref 26.0–34.0)
MCH: 29.5 pg (ref 26.0–34.0)
MCHC: 32.9 g/dL (ref 30.0–36.0)
MCHC: 33.3 g/dL (ref 30.0–36.0)
MCV: 89.5 fL (ref 78.0–100.0)
MCV: 88.7 fL (ref 78.0–100.0)
Platelets: 133 10*3/uL — ABNORMAL LOW (ref 150–400)
Platelets: 126 10*3/uL — ABNORMAL LOW (ref 150–400)
RBC: 4.86 MIL/uL (ref 3.87–5.11)
RBC: 4.85 MIL/uL (ref 3.87–5.11)
RDW: 15.9 % — ABNORMAL HIGH (ref 11.5–15.5)
RDW: 15.9 % — ABNORMAL HIGH (ref 11.5–15.5)
WBC: 17.3 10*3/uL — ABNORMAL HIGH (ref 4.0–10.5)
WBC: 15.2 10*3/uL — ABNORMAL HIGH (ref 4.0–10.5)

## 2014-11-02 LAB — HEPARIN LEVEL (UNFRACTIONATED)
Heparin Unfractionated: 0.49 IU/mL (ref 0.30–0.70)
Heparin Unfractionated: 0.66 IU/mL (ref 0.30–0.70)

## 2014-11-02 LAB — COMPREHENSIVE METABOLIC PANEL
ALT: 19 U/L (ref 0–35)
AST: 17 U/L (ref 0–37)
Albumin: 3.5 g/dL (ref 3.5–5.2)
Alkaline Phosphatase: 89 U/L (ref 39–117)
Anion gap: 8 (ref 5–15)
BUN: 28 mg/dL — ABNORMAL HIGH (ref 6–23)
CO2: 23 mmol/L (ref 19–32)
Calcium: 9.1 mg/dL (ref 8.4–10.5)
Chloride: 109 mmol/L (ref 96–112)
Creatinine, Ser: 1.29 mg/dL — ABNORMAL HIGH (ref 0.50–1.10)
GFR calc Af Amer: 48 mL/min — ABNORMAL LOW (ref >90)
GFR calc non Af Amer: 41 mL/min — ABNORMAL LOW (ref >90)
Glucose, Bld: 113 mg/dL — ABNORMAL HIGH (ref 70–99)
Potassium: 3.7 mmol/L (ref 3.5–5.1)
Sodium: 140 mmol/L (ref 135–145)
Total Bilirubin: 0.5 mg/dL (ref 0.3–1.2)
Total Protein: 7.4 g/dL (ref 6.0–8.3)

## 2014-11-02 LAB — PROTIME-INR
INR: 1.08 (ref 0.00–1.49)
Prothrombin Time: 14.1 seconds (ref 11.6–15.2)

## 2014-11-02 LAB — PLATELET INHIBITION P2Y12: Platelet Function  P2Y12: 222 [PRU] (ref 194–418)

## 2014-11-02 LAB — APTT: aPTT: 92 seconds — ABNORMAL HIGH (ref 24–37)

## 2014-11-02 MED ORDER — DIPHENHYDRAMINE HCL 50 MG/ML IJ SOLN
25.0000 mg | INTRAMUSCULAR | Status: AC
  Administered 2014-11-03: 25 mg via INTRAVENOUS
  Filled 2014-11-02: qty 1

## 2014-11-02 MED ORDER — OFF THE BEAT BOOK
Freq: Once | Status: AC
  Administered 2014-11-02: 09:00:00
  Filled 2014-11-02 (×2): qty 1

## 2014-11-02 MED ORDER — AMLODIPINE BESYLATE 5 MG PO TABS
5.0000 mg | ORAL_TABLET | Freq: Every day | ORAL | Status: DC
  Administered 2014-11-02 – 2014-11-03 (×2): 5 mg via ORAL
  Filled 2014-11-02 (×2): qty 1

## 2014-11-02 MED ORDER — SODIUM CHLORIDE 0.9 % IJ SOLN: 3.0000 mL | INTRAMUSCULAR | Status: DC | PRN

## 2014-11-02 MED ORDER — SODIUM CHLORIDE 0.9 % IV SOLN
INTRAVENOUS | Status: DC
  Administered 2014-11-03: 06:00:00 via INTRAVENOUS

## 2014-11-02 MED ORDER — GI COCKTAIL ~~LOC~~
30.0000 mL | Freq: Two times a day (BID) | ORAL | Status: DC | PRN
  Filled 2014-11-02: qty 30

## 2014-11-02 MED ORDER — ASPIRIN 81 MG PO CHEW
81.0000 mg | CHEWABLE_TABLET | ORAL | Status: AC
  Administered 2014-11-03: 81 mg via ORAL
  Filled 2014-11-02: qty 1

## 2014-11-02 MED ORDER — RANITIDINE HCL 150 MG/10ML PO SYRP
150.0000 mg | ORAL_SOLUTION | Freq: Every day | ORAL | Status: DC
  Administered 2014-11-03: 150 mg via ORAL
  Filled 2014-11-02: qty 10

## 2014-11-02 MED ORDER — SODIUM CHLORIDE 0.9 % IV SOLN: 250.0000 mL | INTRAVENOUS | Status: DC | PRN

## 2014-11-02 MED ORDER — AMIODARONE HCL 200 MG PO TABS
200.0000 mg | ORAL_TABLET | Freq: Two times a day (BID) | ORAL | Status: DC
  Administered 2014-11-02 – 2014-11-03 (×3): 200 mg via ORAL
  Filled 2014-11-02 (×6): qty 1

## 2014-11-02 MED ORDER — FAMOTIDINE IN NACL 20-0.9 MG/50ML-% IV SOLN
20.0000 mg | INTRAVENOUS | Status: AC
  Administered 2014-11-03: 20 mg via INTRAVENOUS
  Filled 2014-11-02: qty 50

## 2014-11-02 MED ORDER — CARVEDILOL 3.125 MG PO TABS
3.1250 mg | ORAL_TABLET | Freq: Two times a day (BID) | ORAL | Status: DC
  Administered 2014-11-02 – 2014-11-03 (×2): 3.125 mg via ORAL
  Filled 2014-11-02 (×5): qty 1

## 2014-11-02 MED ORDER — DOCUSATE SODIUM 100 MG PO CAPS
100.0000 mg | ORAL_CAPSULE | Freq: Every day | ORAL | Status: DC
  Administered 2014-11-03: 100 mg via ORAL
  Filled 2014-11-02 (×4): qty 1

## 2014-11-02 MED ORDER — SODIUM CHLORIDE 0.9 % IJ SOLN
3.0000 mL | Freq: Two times a day (BID) | INTRAMUSCULAR | Status: DC
  Administered 2014-11-02: 3 mL via INTRAVENOUS

## 2014-11-02 MED ORDER — PREDNISONE 20 MG PO TABS
60.0000 mg | ORAL_TABLET | ORAL | Status: AC
  Administered 2014-11-03: 60 mg via ORAL
  Filled 2014-11-02: qty 3

## 2014-11-02 MED ORDER — PREDNISONE 20 MG PO TABS
60.0000 mg | ORAL_TABLET | ORAL | Status: AC
  Administered 2014-11-02: 60 mg via ORAL
  Filled 2014-11-02: qty 3

## 2014-11-02 NOTE — Progress Notes (Signed)
ANTICOAGULATION CONSULT NOTE - Follow Up Consult  Pharmacy Consult for Heparin (apixaban on hold) Indication: atrial fibrillation  Allergies  Allergen Reactions  . Bee Venom Anaphylaxis  . Shrimp [Shellfish Allergy] Swelling    Patient Measurements: Height: 5' 7.5" (171.5 cm) Weight: 216 lb 6.4 oz (98.158 kg) IBW/kg (Calculated) : 62.75  Vital Signs: Temp: 97.7 F (36.5 C) (03/20 0435) Temp Source: Oral (03/20 0435) BP: 148/103 mmHg (03/20 0500) Pulse Rate: 58 (03/20 0520)  Labs:  Recent Labs  10/30/14 1500  10/30/14 1820 10/31/14 0149  10/31/14 0555  10/31/14 2223 11/01/14 0425 11/01/14 1620 11/02/14 0200 11/02/14 0831  HGB  --   --   --   --   < > 15.0  --   --  15.1*  --   --  14.3  HCT  --   --   --   --   --  44.5  --   --  45.0  --   --  43.5  PLT  --   --   --   --   --  136*  --   --  145*  --   --  133*  APTT  --   < > 39*  --   --  36  < > 76* 72*  --   --  92*  HEPARINUNFRC  --   < > 1.76*  --   --  0.82*  < > 0.83* 0.56 0.77* 0.49 0.66  CREATININE  --   --   --   --   --  0.97  --   --  1.31*  --   --  1.29*  TROPONINI 0.07*  --  0.07* 0.07*  --   --   --   --   --   --   --   --   < > = values in this interval not displayed.  Estimated Creatinine Clearance: 50 mL/min (by C-G formula based on Cr of 1.29).  Assessment: 69 year old female on IV heparin (apixaban on hold) for atrial fibrillation.   Heparin level and aPTT are therapeutic and correlating. Will follow only heparin levels. CBC is stable.   Goal of Therapy:  Heparin level 0.3-0.7 units/mL aPTT 66-102 seconds  Monitor platelets by anticoagulation protocol: Yes   Plan:  -Continue heparin at 1250 units/hr  -Daily CBC/HL -Monitor for bleeding  Sloan Leiter, PharmD, BCPS Clinical Pharmacist [phone] 11/02/2014,11:28 AM

## 2014-11-02 NOTE — Progress Notes (Signed)
SUBJECTIVE:  Complains of feeling tired  OBJECTIVE:   Vitals:   Filed Vitals:   11/02/14 0435 11/02/14 0450 11/02/14 0500 11/02/14 0520  BP: 122/71  148/103   Pulse: 54 79 67 58  Temp: 97.7 F (36.5 C)     TempSrc: Oral     Resp: 20 24 16 16   Height:      Weight:   216 lb 6.4 oz (98.158 kg)   SpO2: 99% 92% 96% 100%   I&O's:   Intake/Output Summary (Last 24 hours) at 11/02/14 1145 Last data filed at 11/02/14 0900  Gross per 24 hour  Intake  907.5 ml  Output    400 ml  Net  507.5 ml   TELEMETRY: Reviewed telemetry pt in sinus bradycardia with PAC's     PHYSICAL EXAM General: Well developed, well nourished, in no acute distress Head: Eyes PERRLA, No xanthomas.   Normal cephalic and atramatic  Lungs:   Clear bilaterally to auscultation and percussion. Heart:   HRRR S1 S2 Pulses are 2+ & equal. Abdomen: Bowel sounds are positive, abdomen soft and non-tender without masses Extremities:   No clubbing, cyanosis or edema.  DP +1 Neuro: Alert and oriented X 3. Psych:  Good affect, responds appropriately   LABS: Basic Metabolic Panel:  Recent Labs  11/01/14 0425 11/02/14 0831  NA 139 140  K 3.8 3.7  CL 108 109  CO2 22 23  GLUCOSE 176* 113*  BUN 20 28*  CREATININE 1.31* 1.29*  CALCIUM 9.6 9.1   Liver Function Tests:  Recent Labs  11/01/14 0425 11/02/14 0831  AST 19 17  ALT 15 19  ALKPHOS 96 89  BILITOT 0.6 0.5  PROT 7.8 7.4  ALBUMIN 3.6 3.5   No results for input(s): LIPASE, AMYLASE in the last 72 hours. CBC:  Recent Labs  11/01/14 0425 11/02/14 0831  WBC 14.6* 17.3*  HGB 15.1* 14.3  HCT 45.0 43.5  MCV 87.7 89.5  PLT 145* 133*   Cardiac Enzymes:  Recent Labs  10/30/14 1500 10/30/14 1820 10/31/14 0149  TROPONINI 0.07* 0.07* 0.07*   BNP: Invalid input(s): POCBNP D-Dimer: No results for input(s): DDIMER in the last 72 hours. Hemoglobin A1C: No results for input(s): HGBA1C in the last 72 hours. Fasting Lipid Panel: No results for  input(s): CHOL, HDL, LDLCALC, TRIG, CHOLHDL, LDLDIRECT in the last 72 hours. Thyroid Function Tests: No results for input(s): TSH, T4TOTAL, T3FREE, THYROIDAB in the last 72 hours.  Invalid input(s): FREET3 Anemia Panel: No results for input(s): VITAMINB12, FOLATE, FERRITIN, TIBC, IRON, RETICCTPCT in the last 72 hours. Coag Panel:   Lab Results  Component Value Date   INR 0.96 10/29/2014    RADIOLOGY: Dg Chest 2 View  10/30/2014   CLINICAL DATA:  Shortness of breath.  EXAM: CHEST  2 VIEW  COMPARISON:  October 29, 2014.  FINDINGS: Stable cardiomediastinal silhouette. No pneumothorax or pleural effusion is noted. Slightly increased interstitial densities are noted in both lower lobes concerning for pulmonary edema. Bony thorax appears to be intact.  IMPRESSION: Slightly increased bilateral lower lobe interstitial densities are noted concerning for pulmonary edema.   Electronically Signed   By: Marijo Conception, M.D.   On: 10/30/2014 17:48   Dg Chest 2 View  10/09/2014   CLINICAL DATA:  2-3  week history of chest congestion and head cold.  EXAM: CHEST - 2 VIEW  COMPARISON:  Two-view chest x-ray 09/25/2008  FINDINGS: The heart size is normal. Emphysematous changes are evident  as before. Mild interstitial coarsening is stable. No focal airspace disease is evident. There is no edema or effusion to suggest failure. There is moderate tortuosity of aorta. Atherosclerotic changes are present at the arch.  IMPRESSION: 1. No acute cardiopulmonary disease or significant interval change. 2. Atherosclerosis. 3. Emphysema.   Electronically Signed   By: San Morelle M.D.   On: 10/09/2014 16:01   Dg Chest Port 1 View  11/02/2014   CLINICAL DATA:  Dyspnea  EXAM: PORTABLE CHEST - 1 VIEW  COMPARISON:  10/30/2014  FINDINGS: There is cardiomegaly and aortic tortuosity, unchanged. There is moderate vascular and interstitial prominence, unchanged. There is no confluent airspace consolidation. There is no large  effusion.  IMPRESSION: Unchanged vascular and interstitial prominence, likely mild congestive heart failure.   Electronically Signed   By: Andreas Newport M.D.   On: 11/02/2014 06:43   Dg Chest Port 1 View  10/29/2014   CLINICAL DATA:  Acute chest pain at anterior RIGHT chest, soreness in shortness of breath, symptoms for a few days, history smoking, hypertension, asthma  EXAM: PORTABLE CHEST - 1 VIEW  COMPARISON:  Portable exam 1719 hours compared to 10/09/2014  FINDINGS: Enlargement of cardiac silhouette.  Atherosclerotic calcification aorta.  Mediastinal contours and pulmonary vascularity normal.  Bronchitic changes with bibasilar atelectasis.  Cannot completely exclude LEFT basilar infiltrate.  Upper lungs clear.  No gross pleural effusion or pneumothorax.  IMPRESSION: Bronchitic changes with bibasilar atelectasis, cannot completely exclude LEFT basilar infiltrate.   Electronically Signed   By: Lavonia Dana M.D.   On: 10/29/2014 17:39    ASSESSMENT AND PLAN  [name] [name] is a 69 y.o. female with a history of tobacco abuse and HTN who was admitted to [facility]  for new onset atrial fibrillation and possible PNA  New onset Atrial Fibrillation: in the setting of possible PNA. Initially resolved back to NSR but now back in afib with RVR despite BB.  -- Continue to treat underlying illness.  -- Troponin mildly positive but flat trend 0.07--> 0.07--> 0.07 -- TSH normal -- 2D ECHO with EF 35-40%, diffuse HK, and severe HK of inferior myocardium.  -- Diltiazem changed to BB in the setting of LV dysfunction.  IV Amio gtt started to get HR under better control. Now in NSR.  Will d/c Amio IV since she is mildly bradycardic.  Decrease Coreg to 3.125mg  BID and start Amio 200mg  BID.  Need to avoid CCB with LV dysfunction.  -- CHA2DS2 VASc score is at least 3 (HTN, female sex, Age 57-74) so she was placed on Eliquis 5 mg BID. This was discontinued with anticipated LHC and she was put on heparin gtt.    New onset cardiomyopathy- 2D ECHO with EF 35-40%, diffuse HK, and severe HK of inferior myocardium. WMA on echo suggests CAD as cause of decreased LV function. -- Troponin mildly positive but flat trend 0.07--> 0.07--> 0.07. 14 beat run of NSVT on tele. -- Patient refusing to sign consent. She states that she has had a anaphylactic reaction to contrast dye in the past; however, she is unable to say when or much more. She said her PCP, Dr. Deforest Hoyles would know. I called him and he will be out of the office until Monday. A nurse looked through he chart and reported no prior documentation of previous contrast induced anaphylaxis. Cath cancelled yesterday and plan for Monday after pre-medication with steroids. She agrees to this. -- Likely the reason for her dyspnea, agree with IV lasix  given by IM. Monitors I/Os and K -- continue BB. Hold on ACEi or ARB due to slight bump in creatinine with diuresis. Start after cath if renal function stable.   NSVT- 14 beats noted on tele. This could be ischemic. Recommend LHC as above. Continue BB for now.   Possible PNA: On antibiotics. Management per IM.   HTN: poorly controlled. Add amlodipine 5mg  daily.    Leukocytosis - most likely related to IV solumedrol given Friday in anticipation for cath that was not done.  Remains afebrile.  Tobacco Abuse: smoking cessation strongly advised.   Hypokalemia- repleted    Sueanne Margarita, MD  11/02/2014  11:45 AM

## 2014-11-02 NOTE — Progress Notes (Signed)
PROGRESS NOTE  [name] [name]:[id number] DOB: 05/07/1946 DOA: 10/29/2014 PCP: [name], MD  HPI: [name] [name] is a 69 year old female with a past medical history of HTN, asthma, and arthritis who presented to the [facility] ED on 10/29/14 with new onset atrial fibrillation with RVR. Patient was seen by her PCP on the day of admission complaining of 1 week of intermittent chest pain and was found to be in atrial fibrillation with HR in the 150s. EMS was called and patient was transported to [facility].   Since admission patient has spontaneously converted back to normal sinus rhythm. Patient was hypokalemic with potassium of 3.1 otherwise labs are unremarkable. CXR on admission showed bronchitic changes but could not rule out left basilar infiltrate. CXR on 10/30/14 had evidence of possible pulmonary edema. Patient started on Zithromax and Rocephin. Delta troponin was negative. HR decreased to 60s with diltiazem. Started on anticoagulation.   Subjective: Complained about shortness of breath last night which resolved by now. Had a visitor at bedside to find himself as a cousin, all questions answered.  Assessment/Plan:   Atrial fibrillation - Patient presented with new onset paroxysmal atrial fibrillation with RVR - Patient given diltiazem drip for rate control with last HR 74 bpm - TSH was normal at 1.152 - Troponin negative at 0.04 - CHADsVasc score of 4 (CHF, female gender, age of 63 and hypertension) started on oral Eliquis 5mg  daily - Currently anticoagulated with heparin, heart rate is not controlled. - Coreg at 3.125, amlodipine at 5, and amiodarone. - Started on amiodarone.   CAP (community acquired pneumonia) - Patient presented with cough, fatigue, and congestion  - Initial CXR showed bronchitic changes but could not rule out left basilar infiltrate  - Started on Rochepin and Zithromax - Patient did not have fever, leukocytosis or productive cough, x-rays now look more  like fluids. - Antibiotic discontinued, continue to monitor.  Cardiomyopathy, likely ischemic cardiomyopathy - 2-D echocardiogram showed ejection fraction of 35-40% with increased filling pressure consistent with diastolic dysfunction. - Patient seen by cardiology, patient agreed for cardiac cath, did not tolerate it secondary to shortness of breath (orthopnea) - Appears asymptomatic was not complaining about orthopnea last night but she did when she went to the cardiac cath. - On Cardizem 60 mg twice a day for rate control of atrial fibrillation, and Coreg 3.125 - Given 1 dose of Lasix on 10/30/14, repeat CXR shows possible pulmonary edema - Given around 100 mg of Lasix yesterday, creatinine of 1.29, hold Lasix for today.  Leukocytosis -WBC increased from 8.5-14.6-17.3, this is likely secondary to the steroids patient got prior to the cath. -This is coincide with the discontinuation of antibiotics, but patient still has no fever or chills.  Hypokalemia - Potassium improved after oral placement.  DVT Prophylaxis:  Heparin continuous infusion   Code Status: Full Family Communication: No family at bedside  Disposition Plan: Remain inpatient   Consultants:  Cardiology   Procedures:  Echocardiogram 10/30/14  Cardiac cath   Antibiotics: Anti-infectives    Start     Dose/Rate Route Frequency Ordered Stop   10/29/14 1945  cefTRIAXone (ROCEPHIN) 1 g in dextrose 5 % 50 mL IVPB  Status:  Discontinued     1 g 100 mL/hr over 30 Minutes Intravenous Every 24 hours 10/29/14 1940 11/01/14 1201   10/29/14 1945  azithromycin (ZITHROMAX) 500 mg in dextrose 5 % 250 mL IVPB  Status:  Discontinued     500 mg 250 mL/hr  over 60 Minutes Intravenous Every 24 hours 10/29/14 1940 11/01/14 1201      Objective: Filed Vitals:   11/02/14 0450 11/02/14 0500 11/02/14 0520 11/02/14 1313  BP:  148/103  124/79  Pulse: 79 67 58 70  Temp:    98.1 F (36.7 C)  TempSrc:    Oral  Resp: 24 16 16 18     Height:      Weight:  98.158 kg (216 lb 6.4 oz)    SpO2: 92% 96% 100% 98%    Intake/Output Summary (Last 24 hours) at 11/02/14 1403 Last data filed at 11/02/14 1300  Gross per 24 hour  Intake    747 ml  Output    400 ml  Net    347 ml   Filed Weights   10/31/14 0503 11/01/14 0500 11/02/14 0500  Weight: 97.387 kg (214 lb 11.2 oz) 96.707 kg (213 lb 3.2 oz) 98.158 kg (216 lb 6.4 oz)    Exam: General: Well developed, overweight, patient sitting on edge of the bed NAD, appears stated age  31:  Anicteic Sclera Neck: Supple Cardiovascular: RRR, S1 S2 auscultated, no rubs, murmurs or gallops.   Respiratory: Clear to auscultation bilaterally with equal chest rise- no rales rhonchi or wheezes Abdomen: Soft, nontender, nondistended, + bowel sounds  Extremities: warm dry without cyanosis clubbing or edema.  Neuro: AAOx3 Skin: Without rashes exudates or nodules.   Psych: Normal affect and demeanor with intact judgement and insight   Data Reviewed: Basic Metabolic Panel:  Recent Labs Lab 10/29/14 1650 10/30/14 0445 10/31/14 0555 11/01/14 0425 11/02/14 0831  NA 139 141 141 139 140  K 3.1* 3.1* 3.3* 3.8 3.7  CL 106 109 110 108 109  CO2 21 20 25 22 23   GLUCOSE 108* 104* 107* 176* 113*  BUN 17 17 12 20  28*  CREATININE 1.03 1.06 0.97 1.31* 1.29*  CALCIUM 9.8 8.9 9.1 9.6 9.1  MG 1.9  --   --   --   --   PHOS 4.0  --   --   --   --    Liver Function Tests:  Recent Labs Lab 10/30/14 0445 10/31/14 0555 11/01/14 0425 11/02/14 0831  AST 17 16 19 17   ALT 14 14 15 19   ALKPHOS 97 100 96 89  BILITOT 0.8 0.9 0.6 0.5  PROT 6.9 7.1 7.8 7.4  ALBUMIN 3.5 3.4* 3.6 3.5   CBC:  Recent Labs Lab 10/29/14 1650 10/30/14 0445 10/31/14 0555 11/01/14 0425 11/02/14 0831  WBC 8.6 9.4 8.5 14.6* 17.3*  NEUTROABS  --  6.0  --   --   --   HGB 15.5* 14.7 15.0 15.1* 14.3  HCT 44.4 43.5 44.5 45.0 43.5  MCV 86.5 87.0 87.4 87.7 89.5  PLT 149* 147* 136* 145* 133*   Cardiac  Enzymes:  Recent Labs Lab 10/30/14 1500 10/30/14 1820 10/31/14 0149  TROPONINI 0.07* 0.07* 0.07*   BNP (last 3 results)  Recent Labs  10/30/14 1703  BNP 202.1*   CBG:  Recent Labs Lab 10/30/14 1218  GLUCAP 105*    Recent Results (from the past 240 hour(s))  Culture, blood (routine x 2) Call MD if unable to obtain prior to antibiotics being given     Status: None (Preliminary result)   Collection Time: 10/29/14 10:10 PM  Result Value Ref Range Status   Specimen Description BLOOD LEFT WRIST  Final   Special Requests BOTTLES DRAWN AEROBIC ONLY 5CC  Final   Culture   Final  BLOOD CULTURE RECEIVED NO GROWTH TO DATE CULTURE WILL BE HELD FOR 5 DAYS BEFORE ISSUING A FINAL NEGATIVE REPORT Performed at [facility]    Report Status PENDING  Incomplete  Culture, blood (routine x 2) Call MD if unable to obtain prior to antibiotics being given     Status: None (Preliminary result)   Collection Time: 10/29/14 10:15 PM  Result Value Ref Range Status   Specimen Description BLOOD LT HAND  Final   Special Requests BOTTLES DRAWN AEROBIC ONLY [place]  Final   Culture   Final           BLOOD CULTURE RECEIVED NO GROWTH TO DATE CULTURE WILL BE HELD FOR 5 DAYS BEFORE ISSUING A FINAL NEGATIVE REPORT Performed at [facility]    Report Status PENDING  Incomplete  Culture, sputum-assessment     Status: None   Collection Time: 10/30/14  5:51 PM  Result Value Ref Range Status   Specimen Description SPUTUM  Final   Special Requests NONE  Final   Sputum evaluation   Final    MICROSCOPIC FINDINGS SUGGEST THAT THIS SPECIMEN IS NOT REPRESENTATIVE OF LOWER RESPIRATORY SECRETIONS. PLEASE RECOLLECT. Rosalie Gums RN [id number] 10/30/14 A BROWNING    Report Status 10/30/2014 FINAL  Final     Studies: Dg Chest Port 1 View  11/02/2014   CLINICAL DATA:  Dyspnea  EXAM: PORTABLE CHEST - 1 VIEW  COMPARISON:  10/30/2014  FINDINGS: There is cardiomegaly and aortic tortuosity, unchanged.  There is moderate vascular and interstitial prominence, unchanged. There is no confluent airspace consolidation. There is no large effusion.  IMPRESSION: Unchanged vascular and interstitial prominence, likely mild congestive heart failure.   Electronically Signed   By: Andreas Newport M.D.   On: 11/02/2014 06:43    Scheduled Meds: . amiodarone  200 mg Oral BID  . amLODipine  5 mg Oral Daily  . [START ON 11/03/2014] aspirin  81 mg Oral Pre-Cath  . aspirin EC  81 mg Oral Daily  . carvedilol  3.125 mg Oral BID WC  . [START ON 11/03/2014] diphenhydrAMINE  25 mg Intravenous Pre-Cath  . [START ON 11/03/2014] famotidine (PEPCID) IV  20 mg Intravenous Pre-Cath  . guaiFENesin  1,200 mg Oral BID  . LORazepam  2 mg Intravenous Q4H  . predniSONE  60 mg Oral Pre-Cath   Followed by  . [START ON 11/03/2014] predniSONE  60 mg Oral Pre-Cath  . sodium chloride  3 mL Intravenous Q12H  . sodium chloride  3 mL Intravenous Q12H   Continuous Infusions: . [START ON 11/03/2014] sodium chloride    . heparin 1,250 Units/hr (11/01/14 2326)    Active Problems:   HTN (hypertension)   Atrial fibrillation   CAP (community acquired pneumonia)   Cardiomyopathy   Hypokalemia   Acute on chronic combined systolic and diastolic HF (heart failure)   Acute chest pain   SOB (shortness of breath)   Atrial fibrillation with RVR   Birdie Hopes, MD [facility] Pager: [phone] 11/02/2014, 2:03 PM

## 2014-11-02 NOTE — Progress Notes (Signed)
ANTICOAGULATION CONSULT NOTE - Follow Up Consult  Pharmacy Consult for Heparin (apixaban on hold) Indication: atrial fibrillation  Allergies  Allergen Reactions  . Bee Venom Anaphylaxis  . Shrimp [Shellfish Allergy] Swelling    Patient Measurements: Height: 5' 7.5" (171.5 cm) Weight: 213 lb 3.2 oz (96.707 kg) IBW/kg (Calculated) : 62.75  Vital Signs: Temp: 97.4 F (36.3 C) (03/19 2146) Temp Source: Oral (03/19 2146) BP: 148/89 mmHg (03/19 2146) Pulse Rate: 70 (03/19 2146)  Labs:  Recent Labs  10/30/14 0445 10/30/14 1500  10/30/14 1820 10/31/14 0149 10/31/14 0555 10/31/14 1248 10/31/14 2223 11/01/14 0425 11/01/14 1620 11/02/14 0200  HGB 14.7  --   --   --   --  15.0  --   --  15.1*  --   --   HCT 43.5  --   --   --   --  44.5  --   --  45.0  --   --   PLT 147*  --   --   --   --  136*  --   --  145*  --   --   APTT  --   --   < > 39*  --  36 56* 76* 72*  --   --   HEPARINUNFRC  --   --   < > 1.76*  --  0.82* 0.76* 0.83* 0.56 0.77* 0.49  CREATININE 1.06  --   --   --   --  0.97  --   --  1.31*  --   --   TROPONINI  --  0.07*  --  0.07* 0.07*  --   --   --   --   --   --   < > = values in this interval not displayed.  Estimated Creatinine Clearance: 48.9 mL/min (by C-G formula based on Cr of 1.31).  Assessment: Therapeutic heparin level x 1 after rate decrease.   Goal of Therapy:  Heparin level 0.3-0.7 units/mL Monitor platelets by anticoagulation protocol: Yes   Plan:  -Continue heparin at 1250 units/hr  -0800 HL -Daily CBC/HL -Monitor for bleeding  Narda Bonds 11/02/2014,3:24 AM

## 2014-11-02 NOTE — Progress Notes (Signed)
Pt states she has panic attacks every time she tries to go to sleep because she never has been hospitalized and is scared she is going to die during her procedure. Request medicine to help her sleep but thatis not available at this time. Pt request medicine to help her calm dawn like the other day. Pt request same medication but not as much. 1mg  Ativan given. Patient VSS, will continue to monitor closely.

## 2014-11-02 NOTE — Significant Event (Signed)
Rapid Response Event Note Called per floor RN for Pt with worsening SOB. RN unsure if the cause is anxiety concerning her hospital stay and heart cath scheduled Monday or CHF component.  Overview: Time Called: 4034 Arrival Time: 0445 Event Type: Respiratory, Cardiac, Neurologic  Initial Focused Assessment: Pt found sitting on side of bed, severe SOB. Denies chest pain.RR 30-35, BP 172/114, HR 70s, P02 92% on 2 LNC, Lung sounds with mild congestion in bilateral upper lobes course crackles, bases clear but diminished. Pt demanding to use bedside commode.   Interventions: [place] 02 increased to 6 L, pt assisted to pivot to commode. Once on commode pt appears much calmer and RR  20-25. CXR ordered Stat and M.Lynch NP paged to update on pt status. NP to await CXR results, orders received to give additional 1 mg dose of ativan. Pt left resting in bed, BP improved 148/103, p02 96-99 on 6 LNC. RN advised to monitor closely and notify myself/provider for worsening changes.    Event Summary: Name of Physician Notified: [facility] NP M. Lynch at [place]    at    Outcome: Stayed in room and stabalized  Event End Time: 0515  [name] [name]

## 2014-11-03 ENCOUNTER — Inpatient Hospital Stay (HOSPITAL_COMMUNITY): Payer: Commercial Managed Care - HMO

## 2014-11-03 ENCOUNTER — Encounter (HOSPITAL_COMMUNITY): Payer: Self-pay | Admitting: Certified Registered Nurse Anesthetist

## 2014-11-03 ENCOUNTER — Inpatient Hospital Stay (HOSPITAL_COMMUNITY): Payer: Commercial Managed Care - HMO | Admitting: Certified Registered Nurse Anesthetist

## 2014-11-03 ENCOUNTER — Other Ambulatory Visit: Payer: Self-pay

## 2014-11-03 ENCOUNTER — Encounter (HOSPITAL_COMMUNITY)
Admission: EM | Disposition: A | Discharge status: Home / Self Care | Payer: Self-pay | Source: Home / Self Care | Attending: Thoracic Surgery (Cardiothoracic Vascular Surgery)

## 2014-11-03 DIAGNOSIS — I469 Cardiac arrest, cause unspecified: Secondary | ICD-10-CM

## 2014-11-03 DIAGNOSIS — J9601 Acute respiratory failure with hypoxia: Secondary | ICD-10-CM | POA: Diagnosis present

## 2014-11-03 DIAGNOSIS — I10 Essential (primary) hypertension: Secondary | ICD-10-CM

## 2014-11-03 DIAGNOSIS — I2699 Other pulmonary embolism without acute cor pulmonale: Secondary | ICD-10-CM | POA: Diagnosis not present

## 2014-11-03 DIAGNOSIS — G931 Anoxic brain damage, not elsewhere classified: Secondary | ICD-10-CM | POA: Diagnosis not present

## 2014-11-03 LAB — BASIC METABOLIC PANEL
Anion gap: 11 (ref 5–15)
BUN: 23 mg/dL (ref 6–23)
CO2: 21 mmol/L (ref 19–32)
Calcium: 8.1 mg/dL — ABNORMAL LOW (ref 8.4–10.5)
Chloride: 109 mmol/L (ref 96–112)
Creatinine, Ser: 1.49 mg/dL — ABNORMAL HIGH (ref 0.50–1.10)
GFR calc Af Amer: 40 mL/min — ABNORMAL LOW (ref >90)
GFR calc non Af Amer: 35 mL/min — ABNORMAL LOW (ref >90)
Glucose, Bld: 242 mg/dL — ABNORMAL HIGH (ref 70–99)
Potassium: 3.6 mmol/L (ref 3.5–5.1)
Sodium: 141 mmol/L (ref 135–145)

## 2014-11-03 LAB — PROTIME-INR
INR: 1.17 (ref 0.00–1.49)
Prothrombin Time: 15.1 seconds (ref 11.6–15.2)

## 2014-11-03 LAB — COMPREHENSIVE METABOLIC PANEL
ALT: 21 U/L (ref 0–35)
AST: 18 U/L (ref 0–37)
Albumin: 3.6 g/dL (ref 3.5–5.2)
Alkaline Phosphatase: 90 U/L (ref 39–117)
Anion gap: 14 (ref 5–15)
BUN: 25 mg/dL — ABNORMAL HIGH (ref 6–23)
CO2: 18 mmol/L — ABNORMAL LOW (ref 19–32)
Calcium: 9.4 mg/dL (ref 8.4–10.5)
Chloride: 108 mmol/L (ref 96–112)
Creatinine, Ser: 1.14 mg/dL — ABNORMAL HIGH (ref 0.50–1.10)
GFR calc Af Amer: 56 mL/min — ABNORMAL LOW (ref >90)
GFR calc non Af Amer: 48 mL/min — ABNORMAL LOW (ref >90)
Glucose, Bld: 125 mg/dL — ABNORMAL HIGH (ref 70–99)
Potassium: 4 mmol/L (ref 3.5–5.1)
Sodium: 140 mmol/L (ref 135–145)
Total Bilirubin: 0.9 mg/dL (ref 0.3–1.2)
Total Protein: 6.7 g/dL (ref 6.0–8.3)

## 2014-11-03 LAB — BLOOD GAS, ARTERIAL
Acid-base deficit: 6.9 mmol/L — ABNORMAL HIGH (ref 0.0–2.0)
Bicarbonate: 19.4 mEq/L — ABNORMAL LOW (ref 20.0–24.0)
FIO2: 1 %
O2 Saturation: 98.6 %
PEEP: 10 cmH2O
Patient temperature: 97.5
Pressure control: 20 cmH2O
RATE: 20 resp/min
TCO2: 20.9 mmol/L (ref 0–100)
pCO2 arterial: 46.6 mmHg — ABNORMAL HIGH (ref 35.0–45.0)
pH, Arterial: 7.239 — ABNORMAL LOW (ref 7.350–7.450)
pO2, Arterial: 202 mmHg — ABNORMAL HIGH (ref 80.0–100.0)

## 2014-11-03 LAB — APTT: aPTT: 26 seconds (ref 24–37)

## 2014-11-03 LAB — CBC
HCT: 43.6 % (ref 36.0–46.0)
Hemoglobin: 14.7 g/dL (ref 12.0–15.0)
MCH: 29.3 pg (ref 26.0–34.0)
MCHC: 33.7 g/dL (ref 30.0–36.0)
MCV: 87 fL (ref 78.0–100.0)
Platelets: 123 10*3/uL — ABNORMAL LOW (ref 150–400)
RBC: 5.01 MIL/uL (ref 3.87–5.11)
RDW: 15.8 % — ABNORMAL HIGH (ref 11.5–15.5)
WBC: 12.3 10*3/uL — ABNORMAL HIGH (ref 4.0–10.5)

## 2014-11-03 LAB — MRSA PCR SCREENING: MRSA by PCR: NEGATIVE

## 2014-11-03 LAB — TROPONIN I: Troponin I: 0.08 ng/mL — ABNORMAL HIGH (ref <0.031)

## 2014-11-03 LAB — HEPARIN LEVEL (UNFRACTIONATED): Heparin Unfractionated: 0.29 IU/mL — ABNORMAL LOW (ref 0.30–0.70)

## 2014-11-03 SURGERY — Surgical Case

## 2014-11-03 MED ORDER — LIDOCAINE HCL (PF) 1 % IJ SOLN
INTRAMUSCULAR | Status: AC
  Filled 2014-11-03: qty 30

## 2014-11-03 MED ORDER — CISATRACURIUM BOLUS VIA INFUSION
0.1000 mg/kg | Freq: Once | INTRAVENOUS | Status: DC
  Filled 2014-11-03: qty 10

## 2014-11-03 MED ORDER — NITROGLYCERIN 1 MG/10 ML FOR IR/CATH LAB
INTRA_ARTERIAL | Status: AC
  Filled 2014-11-03: qty 10

## 2014-11-03 MED ORDER — MIDAZOLAM HCL 2 MG/2ML IJ SOLN
2.0000 mg | Freq: Once | INTRAMUSCULAR | Status: AC
  Administered 2014-11-03: 2 mg via INTRAVENOUS

## 2014-11-03 MED ORDER — SODIUM BICARBONATE 8.4 % IV SOLN
INTRAVENOUS | Status: AC
  Filled 2014-11-03: qty 50

## 2014-11-03 MED ORDER — CISATRACURIUM BOLUS VIA INFUSION
0.0500 mg/kg | INTRAVENOUS | Status: AC | PRN
  Administered 2014-11-03: 4.9 mg via INTRAVENOUS
  Filled 2014-11-03: qty 5

## 2014-11-03 MED ORDER — FENTANYL BOLUS VIA INFUSION
25.0000 ug | INTRAVENOUS | Status: DC | PRN
  Administered 2014-11-03 – 2014-11-06 (×3): 25 ug via INTRAVENOUS
  Filled 2014-11-03: qty 25

## 2014-11-03 MED ORDER — INSULIN ASPART 100 UNIT/ML ~~LOC~~ SOLN
0.0000 [IU] | SUBCUTANEOUS | Status: DC
  Administered 2014-11-04: 2 [IU] via SUBCUTANEOUS
  Administered 2014-11-04: 3 [IU] via SUBCUTANEOUS
  Administered 2014-11-04: 5 [IU] via SUBCUTANEOUS
  Administered 2014-11-05 – 2014-11-08 (×5): 2 [IU] via SUBCUTANEOUS

## 2014-11-03 MED ORDER — EPINEPHRINE HCL 0.1 MG/ML IJ SOSY
PREFILLED_SYRINGE | INTRAMUSCULAR | Status: AC
  Filled 2014-11-03: qty 10

## 2014-11-03 MED ORDER — IOHEXOL 350 MG/ML SOLN
100.0000 mL | Freq: Once | INTRAVENOUS | Status: AC | PRN
  Administered 2014-11-03: 100 mL via INTRAVENOUS

## 2014-11-03 MED ORDER — ATROPINE SULFATE 0.1 MG/ML IJ SOLN
INTRAMUSCULAR | Status: AC
  Filled 2014-11-03: qty 20

## 2014-11-03 MED ORDER — FENTANYL CITRATE 0.05 MG/ML IJ SOLN
50.0000 ug | Freq: Once | INTRAMUSCULAR | Status: AC
  Administered 2014-11-03: 50 ug via INTRAVENOUS
  Filled 2014-11-03: qty 2

## 2014-11-03 MED ORDER — MIDAZOLAM HCL 2 MG/2ML IJ SOLN
INTRAMUSCULAR | Status: AC
  Filled 2014-11-03: qty 2

## 2014-11-03 MED ORDER — SODIUM CHLORIDE 0.9 % IV SOLN
1.0000 ug/kg/min | INTRAVENOUS | Status: DC
  Administered 2014-11-03: 1 ug/kg/min via INTRAVENOUS
  Administered 2014-11-04: 1.5 ug/kg/min via INTRAVENOUS
  Filled 2014-11-03 (×2): qty 20

## 2014-11-03 MED ORDER — SODIUM CHLORIDE 0.9 % IV SOLN
25.0000 ug/h | INTRAVENOUS | Status: DC
  Administered 2014-11-03 (×2): 75 ug/h via INTRAVENOUS
  Administered 2014-11-04: 200 ug/h via INTRAVENOUS
  Administered 2014-11-05: 100 ug/h via INTRAVENOUS
  Administered 2014-11-05: 200 ug/h via INTRAVENOUS
  Administered 2014-11-05: 100 ug/h via INTRAVENOUS
  Filled 2014-11-03 (×4): qty 50

## 2014-11-03 MED ORDER — HEPARIN (PORCINE) IN NACL 100-0.45 UNIT/ML-% IJ SOLN
1000.0000 [IU]/h | INTRAMUSCULAR | Status: DC
  Administered 2014-11-03: 800 [IU]/h via INTRAVENOUS
  Filled 2014-11-03: qty 250

## 2014-11-03 MED ORDER — NITROGLYCERIN 0.4 MG SL SUBL
0.4000 mg | SUBLINGUAL_TABLET | SUBLINGUAL | Status: DC | PRN
  Administered 2014-11-03: 0.4 mg via SUBLINGUAL

## 2014-11-03 MED ORDER — SODIUM CHLORIDE 0.9 % IV SOLN
1.0000 mg/h | INTRAVENOUS | Status: DC
  Administered 2014-11-03 – 2014-11-05 (×3): 2 mg/h via INTRAVENOUS
  Filled 2014-11-03 (×4): qty 10

## 2014-11-03 MED ORDER — POTASSIUM CHLORIDE 10 MEQ/50ML IV SOLN
10.0000 meq | INTRAVENOUS | Status: AC
  Administered 2014-11-03 – 2014-11-04 (×3): 10 meq via INTRAVENOUS
  Filled 2014-11-03 (×3): qty 50

## 2014-11-03 MED ORDER — PROPOFOL 10 MG/ML IV EMUL
5.0000 ug/kg/min | INTRAVENOUS | Status: DC
  Administered 2014-11-03: 20 ug/kg/min via INTRAVENOUS
  Filled 2014-11-03 (×2): qty 100

## 2014-11-03 MED ORDER — PANTOPRAZOLE SODIUM 40 MG IV SOLR
40.0000 mg | INTRAVENOUS | Status: DC
  Administered 2014-11-03 – 2014-11-09 (×7): 40 mg via INTRAVENOUS
  Filled 2014-11-03 (×9): qty 40

## 2014-11-03 MED ORDER — NOREPINEPHRINE BITARTRATE 1 MG/ML IV SOLN
0.0000 ug/min | INTRAVENOUS | Status: DC
  Administered 2014-11-03: 5 ug/min via INTRAVENOUS
  Administered 2014-11-04 (×2): 13 ug/min via INTRAVENOUS
  Filled 2014-11-03 (×3): qty 4

## 2014-11-03 MED ORDER — HYDRALAZINE HCL 20 MG/ML IJ SOLN
10.0000 mg | INTRAMUSCULAR | Status: DC | PRN
  Administered 2014-11-03 – 2014-11-09 (×6): 20 mg via INTRAVENOUS
  Administered 2014-11-09 – 2014-11-10 (×5): 40 mg via INTRAVENOUS
  Administered 2014-11-12 (×2): 20 mg via INTRAVENOUS
  Filled 2014-11-03 (×2): qty 2
  Filled 2014-11-03 (×2): qty 1
  Filled 2014-11-03: qty 2
  Filled 2014-11-03: qty 1
  Filled 2014-11-03: qty 2
  Filled 2014-11-03: qty 1
  Filled 2014-11-03 (×2): qty 2
  Filled 2014-11-03: qty 1
  Filled 2014-11-03: qty 2
  Filled 2014-11-03 (×2): qty 1
  Filled 2014-11-03: qty 2

## 2014-11-03 MED ORDER — EPINEPHRINE HCL 0.1 MG/ML IJ SOSY
PREFILLED_SYRINGE | INTRAMUSCULAR | Status: AC
  Filled 2014-11-03: qty 30

## 2014-11-03 MED ORDER — ATROPINE SULFATE 0.1 MG/ML IJ SOLN
INTRAMUSCULAR | Status: AC
  Filled 2014-11-03: qty 10

## 2014-11-03 MED ORDER — SODIUM CHLORIDE 0.9 % IV SOLN
2000.0000 mL | Freq: Once | INTRAVENOUS | Status: AC
  Administered 2014-11-03: 2000 mL via INTRAVENOUS

## 2014-11-03 MED ORDER — ARTIFICIAL TEARS OP OINT
1.0000 "application " | TOPICAL_OINTMENT | Freq: Three times a day (TID) | OPHTHALMIC | Status: DC
  Administered 2014-11-03 – 2014-11-06 (×7): 1 via OPHTHALMIC
  Filled 2014-11-03 (×2): qty 3.5

## 2014-11-03 NOTE — Interval H&P Note (Signed)
History and Physical Interval Note:  11/03/2014 4:05 PM  [name] [name]  has presented today for cardiac cath with the diagnosis of unstable angina, cardiomyopathy. The various methods of treatment have been discussed with the patient and family. After consideration of risks, benefits and other options for treatment, the patient has consented to  Procedure(s): LEFT HEART CATHETERIZATION WITH CORONARY ANGIOGRAM (N/A) as a surgical intervention .  [name] history has been reviewed, patient examined, no change in status, stable for surgery.  I have reviewed the patient's chart and labs.  Questions were answered to the patient's satisfaction.    Cath Lab Visit (complete for each Cath Lab visit)  Clinical Evaluation Leading to the Procedure:   ACS: Yes.    Non-ACS:    Anginal Classification: CCS III  Anti-ischemic medical therapy: Minimal Therapy (1 class of medications)  Non-Invasive Test Results: No non-invasive testing performed  Prior CABG: No previous CABG         [name] [name]

## 2014-11-03 NOTE — Progress Notes (Signed)
Cardiologist on call notified of pt c/o of 8 out of 10 rt sided chest tightness. Pt's VS stable & charted. Pt is anxious & nervous about having a cath later on today. Pt keeps stating reasons as to why she can't have the procedure done. EKG done & charted. Pt given 1 SL nitro. Pt no longer has any pain. Cardiologist made aware. Will pass on info to on-coming RN. [place] Brunette

## 2014-11-03 NOTE — H&P (View-Only) (Signed)
SUBJECTIVE: Had some more chest pain last night and this AM. For now she is agreeable to [facility] which is scheduled for this afternoon.   OBJECTIVE:   Vitals:   Filed Vitals:   11/03/14 0537 11/03/14 0620 11/03/14 0657 11/03/14 0701  BP: 143/81 154/89 167/90 159/86  Pulse: 73     Temp: 97.9 F (36.6 C)     TempSrc: Oral     Resp: 18 23 22 22   Height:      Weight: 214 lb 8 oz (97.297 kg)     SpO2: 97% 100% 100% 100%   I&O's:    Intake/Output Summary (Last 24 hours) at 11/03/14 0744 Last data filed at 11/03/14 0100  Gross per 24 hour  Intake    480 ml  Output   1000 ml  Net   -520 ml   TELEMETRY: NSR with vent bigeminy and some 3 and 4 beat runs of NSVT. NSR.   PHYSICAL EXAM General: Well developed, well nourished, in no acute distress Head: Eyes PERRLA, No xanthomas.   Normal cephalic and atramatic  Lungs:   Clear bilaterally to auscultation and percussion. Heart:   HRRR S1 S2 Pulses are 2+ & equal. Abdomen: Bowel sounds are positive, abdomen soft and non-tender without masses Extremities:   No clubbing, cyanosis or edema.  DP +1 Neuro: Alert and oriented X 3. Psych:  Good affect, responds appropriately   LABS: Basic Metabolic Panel:  Recent Labs  11/02/14 1349 11/03/14 0441  NA 140 140  K 3.2* 4.0  CL 106 108  CO2 23 18*  GLUCOSE 126* 125*  BUN 28* 25*  CREATININE 1.24* 1.14*  CALCIUM 9.2 9.4   Liver Function Tests:  Recent Labs  11/02/14 0831 11/03/14 0441  AST 17 18  ALT 19 21  ALKPHOS 89 90  BILITOT 0.5 0.9  PROT 7.4 6.7  ALBUMIN 3.5 3.6   No results for input(s): LIPASE, AMYLASE in the last 72 hours. CBC:  Recent Labs  11/02/14 1349 11/03/14 0441  WBC 15.2* 12.3*  HGB 14.3 14.7  HCT 43.0 43.6  MCV 88.7 87.0  PLT 126* 123*   Coag Panel:   Lab Results  Component Value Date   INR 1.08 11/02/2014   INR 0.96 10/29/2014    RADIOLOGY: Dg Chest 2 View  10/30/2014   CLINICAL DATA:  Shortness of breath.  EXAM: CHEST  2 VIEW   COMPARISON:  October 29, 2014.  FINDINGS: Stable cardiomediastinal silhouette. No pneumothorax or pleural effusion is noted. Slightly increased interstitial densities are noted in both lower lobes concerning for pulmonary edema. Bony thorax appears to be intact.  IMPRESSION: Slightly increased bilateral lower lobe interstitial densities are noted concerning for pulmonary edema.   Electronically Signed   By: Marijo Conception, M.D.   On: 10/30/2014 17:48   Dg Chest 2 View  10/09/2014   CLINICAL DATA:  2-3  week history of chest congestion and head cold.  EXAM: CHEST - 2 VIEW  COMPARISON:  Two-view chest x-ray 09/25/2008  FINDINGS: The heart size is normal. Emphysematous changes are evident as before. Mild interstitial coarsening is stable. No focal airspace disease is evident. There is no edema or effusion to suggest failure. There is moderate tortuosity of aorta. Atherosclerotic changes are present at the arch.  IMPRESSION: 1. No acute cardiopulmonary disease or significant interval change. 2. Atherosclerosis. 3. Emphysema.   Electronically Signed   By: San Morelle M.D.   On: 10/09/2014 16:01  Dg Chest Port 1 View  11/02/2014   CLINICAL DATA:  Dyspnea  EXAM: PORTABLE CHEST - 1 VIEW  COMPARISON:  10/30/2014  FINDINGS: There is cardiomegaly and aortic tortuosity, unchanged. There is moderate vascular and interstitial prominence, unchanged. There is no confluent airspace consolidation. There is no large effusion.  IMPRESSION: Unchanged vascular and interstitial prominence, likely mild congestive heart failure.   Electronically Signed   By: Andreas Newport M.D.   On: 11/02/2014 06:43   Dg Chest Port 1 View  10/29/2014   CLINICAL DATA:  Acute chest pain at anterior RIGHT chest, soreness in shortness of breath, symptoms for a few days, history smoking, hypertension, asthma  EXAM: PORTABLE CHEST - 1 VIEW  COMPARISON:  Portable exam 1719 hours compared to 10/09/2014  FINDINGS: Enlargement of cardiac  silhouette.  Atherosclerotic calcification aorta.  Mediastinal contours and pulmonary vascularity normal.  Bronchitic changes with bibasilar atelectasis.  Cannot completely exclude LEFT basilar infiltrate.  Upper lungs clear.  No gross pleural effusion or pneumothorax.  IMPRESSION: Bronchitic changes with bibasilar atelectasis, cannot completely exclude LEFT basilar infiltrate.   Electronically Signed   By: Lavonia Dana M.D.   On: 10/29/2014 17:39    ASSESSMENT AND PLAN  [name] [name] is a 69 y.o. female with a history of tobacco abuse and HTN who was admitted to [facility]  for new onset atrial fibrillation and possible PNA  New onset Atrial Fibrillation: in the setting of possible PNA. She has been in and out of this since admission. Currently maintaining NSR on amio 200mg  BID and coreg 3.125mg  BID. Avoiding CCB in the setting of LV dysfunction.  -- Continue to treat underlying illness.  -- Troponin mildly positive but flat trend 0.07--> 0.07--> 0.07 -- TSH normal -- 2D ECHO with EF 35-40%, diffuse HK, and severe HK of inferior myocardium.  -- CHA2DS2 VASc score is at least 3 (HTN, female sex, Age 53-74) so she was placed on Eliquis 5 mg BID. This was discontinued with anticipated LHC and she was put on heparin gtt.   New onset cardiomyopathy- 2D ECHO with EF 35-40%, diffuse HK, and severe HK of inferior myocardium. WMA on echo suggests CAD as cause of decreased LV function. -- Troponin mildly positive but flat trend 0.07--> 0.07--> 0.07. 14 beat run of NSVT on tele.  -- Patient initially refused to sign consent. She states that she has had a anaphylactic reaction to contrast dye in the past; however, she is unable to say when or much more. She said her PCP, Dr. Deforest Hoyles would know. I called him and he will be out of the office until Monday. A nurse looked through he chart and reported no prior documentation of previous contrast induced anaphylaxis. Cath cancelled Friday and plan for Monday after  pre-medication with steroids. She agrees to this. -- She had some dyspnea that improved with IV diuresis. Will likely need maintenance dose diuretics after LHC. -- Continue BB. Hold on ACEi or ARB due to slight bump in creatinine with diuresis. Start after cath if renal function stable.   NSVT- 14 beats noted on tele. Some more 3 and 4 beat runs noted.  -- This could be ischemic. Recommend LHC as above. Continue BB for now.   Possible PNA: On antibiotics. Management per IM.   HTN: poorly controlled. amlodipine 5mg  added. Continue  coreg 3.125mg  BID.  Leukocytosis - most likely related to IV solumedrol given Friday in anticipation for cath that was not done.  Remains afebrile.  Tobacco  Abuse: smoking cessation strongly advised.   Hypokalemia- repleted    Eileen Stanford, PA-C  11/03/2014  7:44 AM  Patient seen, examined. Available data reviewed. Agree with findings, assessment, and plan as outlined by Angelena Form, PA-C. The patient is independently interviewed and examined. Her lung fields are clear. Heart is regular rate and rhythm with frequent premature beats. There is no significant murmur. There is no peripheral edema.  I have reviewed all of her data. She went to the Cath Lab last week but had a panic attack. She is scheduled for cardiac catheterization and possible PCI today. I have reviewed the risks, indications, and alternatives with the patient. She agrees to proceed. She has been premedicated for a possible contrast allergy. Differential diagnosis for her cardiomyopathy includes tachycardia mediated (atrial fibrillation) versus ischemic heart disease as the most likely etiologies. Otherwise as outlined above.  Sherren Mocha, M.D. 11/03/2014 8:34 AM

## 2014-11-03 NOTE — Progress Notes (Addendum)
Pt was brought to the cath lab at 4:05 pm and was calm upon arrival but when we moved her to the cath table, she became agitated and would not cooperate with instructions to lay on the cath table. We attempted to calm her down. She began to hyperventilate and stated that she could not breath. She described shortness of breath although her oxygen saturations were 100% and lungs are clear. The patient continued to fight to get off of the cath table so we returned her back to her bed and cancelled the cath. At this point she continued to attempt to get out of the bed. There was a similar presentation when cath was attempted on Friday 10/31/14. (see EPIC notes).   The patient became more combative and then was found to be unresponsive. Code Blue called. She was quickly intubated by anesthesia. Rhythm noted to be bradycardia/asystole. She was given epinephrine and atropine while CPR was performed. She regained a pulse. She was evaluated by the critical care team in the cath lab. Arterial line placed right radial artery by Dr. Nelda Marseille. Pt was taken for emergent head CT and chest CT as there is suspicion for acute neurological event vs PE. I have discussed this with her family over the phone. Critical care team assuming care. (cousin Veleta Miners [phone]).   Sharicka Pogorzelski  3/21/20164:21 PM

## 2014-11-03 NOTE — Consult Note (Signed)
PULMONARY / CRITICAL CARE MEDICINE   Name: [name] [name] MRN: [id number] DOB: 09/09/1945    ADMISSION DATE:  10/29/2014 CONSULTATION DATE:  11/03/2014  REFERRING MD :  Hartford Poli  CHIEF COMPLAINT:  Arrest  INITIAL PRESENTATION: 69 year old female admitted 3/16 for now onset Afib RVR with chest pain and possible PNA.  Was planned for cath 3/18 but was pushed back to 3/21 due to anxiety and possible dye allergy. 3/21 while getting ready for cath she became dyspneic and eventually lost consciousness. Suffered cardiac arrest. Downtime 5-10 min with epi and atropine had ROSC. PCCM to see.   STUDIES:  Echo > LVEF 35-40 %, severe hypokinesis of inferolateral myocardium. Decreased diastolic compliance. PA press 64mm/Hg. CTA chest 3/21 > CT head 3/21 > no acute intracranial abnormality  SIGNIFICANT EVENTS: 3/16 > admitted to [facility] with new onset Afib RVR and cardiomyopathy 3/18 > was taken for cath which was postponed due to anxiety and resp. distress.  3/21 > to cath lab, again developed SOB > cardiac arrest 10 mins > intubated . To ICU, hypothermia started  HISTORY OF PRESENT ILLNESS:  69 year old female with PMH as below, which is significant for HTN and asthma. She presented to [facility] ED 3/16 c/o chest tightness and weakness x 1 week. In ED she was found to be in new onset  AFib with RVR. Notably she recently had URI symptoms and had been taking OTC cold medications. She was started on dilt gtt and admitted. She was seen by cardiology on day of admission who recommended anticoagulation. Echo showed a new cardiomyopathy with EF 35-45% and diffuse hypokineses. Troponin was mildly elevated but was a flat trend and she was scheduled for LHC on 3/18, however she refused due to a reported IV dye allergy, when she got to the cath lab she became dyspneic which appeared to have an anxiety component and cath as pushed to the following Monday 3/21. She was taken for cath 3/21 but again developed acute onset SOB and  tachypnea. She felt as though she could not breathe, however sats remained 100% on [place]. She progressed to have a cardiac arrest, which those present at the time likely started as a respiratory arrest. She underwent ACLS for 5-10 mins. PEA resolved with atropine and epi x 1. No purposeful movement.interaction post arrest. PCCM to see.   PAST MEDICAL HISTORY :   has a past medical history of Hypertension; Asthma; and Arthritis.  has no past surgical history on file. Prior to Admission medications   Medication Sig Start Date End Date Taking? Authorizing Provider  hydrochlorothiazide (HYDRODIURIL) 12.5 MG tablet Take 12.5 mg by mouth daily.   Yes Historical Provider, MD  ibuprofen (ADVIL,MOTRIN) 200 MG tablet Take 200 mg by mouth every 4 (four) hours as needed for pain.   Yes Historical Provider, MD  metoprolol succinate (TOPROL-XL) 25 MG 24 hr tablet Take 1 tablet (25 mg total) by mouth daily. Please only take HCTZ 12.5 mg and not the 25 mg if you are going back to metoprolol 10/10/14  Yes Thao P Le, DO  potassium chloride 40 MEQ/15ML (20%) LIQD Take 13.3 mEq by mouth every other day.    Yes Historical Provider, MD  amoxicillin (AMOXIL) 500 MG capsule Take 1 capsule (500 mg total) by mouth 2 (two) times daily. For sinus infection 10/09/14   Thao P Le, DO  chlorthalidone (HYGROTON) 25 MG tablet Take 1 tablet (25 mg total) by mouth daily. 10/09/14   Thao P  Le, DO  meloxicam (MOBIC) 7.5 MG tablet Take 1 tablet (7.5 mg total) by mouth daily. 10/09/14   Thao P Le, DO   Allergies  Allergen Reactions  . Bee Venom Anaphylaxis  . Shrimp [Shellfish Allergy] Swelling    FAMILY HISTORY:  indicated that her mother is deceased. She indicated that her father is deceased.  SOCIAL HISTORY:  reports that she has been smoking Cigarettes.  She has a 32 pack-year smoking history. She does not have any smokeless tobacco history on file. She reports that she does not drink alcohol or use illicit drugs.  REVIEW OF  SYSTEMS:  Unable, intubated  SUBJECTIVE:   VITAL SIGNS: Temp:  [97.5 F (36.4 C)-97.9 F (36.6 C)] 97.5 F (36.4 C) (03/21 1329) Pulse Rate:  [68-73] 68 (03/21 1329) Resp:  [17-23] 20 (03/21 1329) BP: (143-178)/(76-90) 168/90 mmHg (03/21 1329) SpO2:  [95 %-100 %] 95 % (03/21 1329) Weight:  [97.297 kg (214 lb 8 oz)] 97.297 kg (214 lb 8 oz) (03/21 0537) HEMODYNAMICS:   VENTILATOR SETTINGS:   INTAKE / OUTPUT:  Intake/Output Summary (Last 24 hours) at 11/03/14 1700 Last data filed at 11/03/14 1300  Gross per 24 hour  Intake      0 ml  Output   1125 ml  Net  -1125 ml    PHYSICAL EXAMINATION: General:  Obese female in NAD on vent Neuro:  Obtunded, flexion to painful stimuli. Does not follow commands HEENT:  /AT, PERRL, no JVD noted Cardiovascular:  Irreg irreg, brady, no MRG Lungs:  Diffuse crackles, worse in bases.  Abdomen:  Soft, non-distended, + BS Musculoskeletal:  No acute deformity of ROM limitation Skin:  Grossly intact  LABS:  CBC  Recent Labs Lab 11/02/14 0831 11/02/14 1349 11/03/14 0441  WBC 17.3* 15.2* 12.3*  HGB 14.3 14.3 14.7  HCT 43.5 43.0 43.6  PLT 133* 126* 123*   Coag's  Recent Labs Lab 10/29/14 1650  10/31/14 2223 11/01/14 0425 11/02/14 0831 11/02/14 1349  APTT  --   < > 76* 72* 92*  --   INR 0.96  --   --   --   --  1.08  < > = values in this interval not displayed. BMET  Recent Labs Lab 11/02/14 0831 11/02/14 1349 11/03/14 0441  NA 140 140 140  K 3.7 3.2* 4.0  CL 109 106 108  CO2 23 23 18*  BUN 28* 28* 25*  CREATININE 1.29* 1.24* 1.14*  GLUCOSE 113* 126* 125*   Electrolytes  Recent Labs Lab 10/29/14 1650  11/02/14 0831 11/02/14 1349 11/03/14 0441  CALCIUM 9.8  < > 9.1 9.2 9.4  MG 1.9  --   --   --   --   PHOS 4.0  --   --   --   --   < > = values in this interval not displayed. Sepsis Markers No results for input(s): LATICACIDVEN, PROCALCITON, O2SATVEN in the last 168 hours. ABG No results for input(s):  PHART, PCO2ART, PO2ART in the last 168 hours. Liver Enzymes  Recent Labs Lab 11/01/14 0425 11/02/14 0831 11/03/14 0441  AST 19 17 18   ALT 15 19 21   ALKPHOS 96 89 90  BILITOT 0.6 0.5 0.9  ALBUMIN 3.6 3.5 3.6   Cardiac Enzymes  Recent Labs Lab 10/30/14 1500 10/30/14 1820 10/31/14 0149  TROPONINI 0.07* 0.07* 0.07*   Glucose  Recent Labs Lab 10/30/14 1218  GLUCAP 105*    Imaging Dg Chest Port 1 View  11/02/2014  CLINICAL DATA:  Dyspnea  EXAM: PORTABLE CHEST - 1 VIEW  COMPARISON:  10/30/2014  FINDINGS: There is cardiomegaly and aortic tortuosity, unchanged. There is moderate vascular and interstitial prominence, unchanged. There is no confluent airspace consolidation. There is no large effusion.  IMPRESSION: Unchanged vascular and interstitial prominence, likely mild congestive heart failure.   Electronically Signed   By: Andreas Newport M.D.   On: 11/02/2014 06:43     ASSESSMENT / PLAN:  PULMONARY OETT 3/21 >>> A: Acute respiratory failure s/p cardiac arrest  P:   Full vent support with PC CXR for tube placement Follow ABGs VAP bundle  CARDIOVASCULAR CVL 3/21 >>> A:  Cardiac arrest, etiology uncertain. CVA vs PE vs primary respiratory arrest, flash pulmonary edema? Cardiomyopathy LVEF 35-45% severe HK inferior, diffuse HK New Onset AF RVR Bradycardia HTN  P:  Telemetry monitoring EKG MAP goal > 80 mm/Hg Levophed for MAP goal PRN hydralazine to keep SBP < 134mm/Hg CVP monitoring Check lactic Repeat Echo 3/22 Initiate therapeutic hypothermia Holding amio in setting bradycardia Holding norvasc Continue low dose coreg  RENAL A:   AKI Hypokalemia  P:   Strict I&O Follow Bmet Replace electrolytes as indicated  GASTROINTESTINAL A:   No acute issues  P:   NPO PPI  HEMATOLOGIC A:   No acute issues  P:  Follow cbc Heparin gtt for AF per pharmacy  INFECTIOUS A:   ? CAP - ABX stopped by primary  P:   Monitor  clinically  ENDOCRINE A:   No acute issues  P:   Follow glucose on chemistry Add SSI if consistently greater than 180.  NEUROLOGIC A:   Acute metabolic encephalopathy Consider PRES P:   RASS goal: -5 Propofol, sublimaze infusions Nimbex per hypothermia protocol Monitor  FAMILY  - Updates:   - Inter-disciplinary family meet or Palliative Care meeting due by:  3/28  Georgann Housekeeper, AGACNP-BC [place] Pulmonology/Critical Care Pager [phone] or [phone]  Patient arrested in the cath lab, currently posturing, unclear to me the cause of cardiac arrest.  Given posturing anticipate an acute CVA specially with a-fib present.  Code was only for 9 minutes.  Will move to CT for STAT head CT and CAT to r/o PE.  If negative then will start cooling process.  Will place TLC as well.  Will likely need EEG and neuro consultation for prognostication purposes.  Patient remains critically ill.  Above note edited in full.  Will transfer to the ICU and maintain on full vent support.  The patient is critically ill with multiple organ systems failure and requires high complexity decision making for assessment and support, frequent evaluation and titration of therapies, application of advanced monitoring technologies and extensive interpretation of multiple databases.   Critical Care Time devoted to patient care services described in this note is  60  Minutes. This time reflects time of care of this signee Dr [name]. This critical care time does not reflect procedure time, or teaching time or supervisory time of PA/NP/Med student/Med Resident etc but could involve care discussion time.  Rush Farmer, M.D. [facility] Pulmonary/Critical Care Medicine. Pager: [phone]. After hours pager: [phone].

## 2014-11-03 NOTE — Progress Notes (Signed)
ANTICOAGULATION CONSULT NOTE - Follow Up Consult  Pharmacy Consult for Heparin (apixaban on hold) Indication: atrial fibrillation  Allergies  Allergen Reactions  . Bee Venom Anaphylaxis  . Shrimp [Shellfish Allergy] Swelling    Patient Measurements: Height: 5' 7.5" (171.5 cm) Weight: 214 lb 8 oz (97.297 kg) IBW/kg (Calculated) : 62.75  Vital Signs: Temp: 97.9 F (36.6 C) (03/21 0537) Temp Source: Oral (03/21 0537) BP: 159/86 mmHg (03/21 0701) Pulse Rate: 73 (03/21 0537)  Labs:  Recent Labs  10/31/14 2223  11/01/14 0425  11/02/14 0200 11/02/14 0831 11/02/14 1349 11/03/14 0441  HGB  --   < > 15.1*  --   --  14.3 14.3 14.7  HCT  --   < > 45.0  --   --  43.5 43.0 43.6  PLT  --   < > 145*  --   --  133* 126* 123*  APTT 76*  --  72*  --   --  92*  --   --   LABPROT  --   --   --   --   --   --  14.1  --   INR  --   --   --   --   --   --  1.08  --   HEPARINUNFRC 0.83*  --  0.56  < > 0.49 0.66  --  0.29*  CREATININE  --   < > 1.31*  --   --  1.29* 1.24* 1.14*  < > = values in this interval not displayed.  Estimated Creatinine Clearance: 56.3 mL/min (by C-G formula based on Cr of 1.14).  Assessment: 69 year old female on IV heparin (apixaban on hold) for atrial fibrillation and and he is also noted for cath today (EF= 35-40% and severe hypokinesis of inferior myocardium). Heparin level this am is 0.29 and is just below goal.   Goal of Therapy:  Heparin level 0.3-0.7 units/mL aPTT 66-102 seconds  Monitor platelets by anticoagulation protocol: Yes   Plan:  -Increase heparin to 1300 units/hr  -Daily CBC/HL -Will follow plans post cath  Hildred Laser, Pharm D 11/03/2014 9:31 AM   e

## 2014-11-03 NOTE — Progress Notes (Signed)
Called by nursing for patient having chest pain. Also very anxious about cath and overall hospitalization. She was given ativan earlier per orders. EKG reviewed and does not show significant change from prior. BP 159/86. 1 SL NTG given with relief of CP. She is now sleeping. Note plan for cath today. Will continue to monitor and will let rounding team know of events. Jerilynn Feldmeier PA-C

## 2014-11-03 NOTE — Care Management Note (Addendum)
Page 1 of 2   11/24/2014     2:42:15 PM CARE MANAGEMENT NOTE 11/24/2014  Patient:  [name] [name],[name] [name]   Account Number:  [id number]  Date Initiated:  11/03/2014  Documentation initiated by:  GRAVES-BIGELOW,BRENDA  Subjective/Objective Assessment:   Pt admitted for afib new onset and CAP. Initiated on IV Cardizem gtt and IV Rocephin.     Action/Plan:   CM to monitor for disposition needs.   Anticipated DC Date:  11/22/2014   Anticipated DC Plan:  [place]  CM consult  Medication Assistance      Choice offered to / List presented to:          [facility] arranged  [place] - 11 Patient Refused      Status of service:  Completed, signed off Medicare Important Message given?  YES (If response is "NO", the following Medicare IM given date fields will be blank) Date Medicare IM given:  11/03/2014 Medicare IM given by:  GRAVES-BIGELOW,BRENDA Date Additional Medicare IM given:  11/20/2014 Additional Medicare IM given by:  Marvetta Gibbons  Discharge Disposition:  HOME/SELF CARE  Per UR Regulation:  Reviewed for med. necessity/level of care/duration of stay  If discussed at [place] of Stay Meetings, dates discussed:   11/06/2014  11/11/2014  11/13/2014    Comments:  11/24/14- 1200- Marvetta Gibbons RN, BSN [phone] Received call from pt's daughter Persephanie Laatsch [phone])- regarding [place] services- upon looking at the chart noted that pt had refused [place] services at discharge- informed daughter of this which she did not know- daughter would still like pt to have [place] services- and plans to talk with pt today- explained that [facility] services could still be arranged today but after that pt would have to go through her PCP if she wanted [facility] services- 1400- f/u call made to pt herself regarding [place] services- pt now agreeable to [place] and would also like an aide if insurance will cover- explained to pt/sister/and daughter that insurance would cover [facility] services but not someone to  assist with cooking and cleaning - will provide them with private duty list via daughters email to assist them with finding someone to assist- they understand this would be an out of pocket expense- will also have [place] f/u with SW to see about perhaps meals on wheels or other community resources- choice offered over phone for [facility] agency of [place]- daughter choice [facility] for services- referral called to [place] with [facility] for HH-RN/aide/SW- "[facility] to f/u with pt at home.  11/22/14 13:00 Cm met with pt who continues to refuse [facility] services.  No other CM needs were communicated.  Mariane Masters, BSN, [place]. 11/20/14- 1200- Steffanie Dunn Deone Omahoney RN< BSN [phone] pt started on Eliquis- benefits check completed- S/W NICOLE @ [employer] #  [phone] ELIQUIS 2.5 MG BID  COVER- YES CO-PAY-  $ 45.00   QUANITY LIMITES OF 60 PILL PER 30 DAYS TIER-3 DRUG PRIOR APPROVAL PHARMACY- [facility], [facility] AND [facility]  * ELIQUIS 5 MG COVER-YES CO-PAY- $47.00   QUNITY LIMITED OF 39 PILLS FOR 30 DAYS TIER- 3 DRUG PRIOR APPROVAL- NO PHARMACY - [facility], [place]  spoke with pt and given above information- pt also given 30 day free card for Eliquis.   11/13/14 [place] MSN BSN CCM Pt scheduled for CABG on 11/14/14.  Lives alone, pt and dtr aware that pt will need 24/7 assistance when medically stable for discharge  following surgery, family is hoping to provide same.  PT has also discussed possibility of ST-SNF for rehab.

## 2014-11-03 NOTE — Progress Notes (Signed)
[place] Progress Note Patient Name: [name] [name] DOB: 03-04-1946 MRN: [id number]   Date of Service  11/03/2014  HPI/Events of Note  No hypotensive, bradycardic Propofol effect?  eICU Interventions  Stop propofol Use versed     Intervention Category Major Interventions: Hypotension - evaluation and management Minor Interventions: Routine modifications to care plan (e.g. PRN medications for pain, fever)  Santia Labate 11/03/2014, 10:27 PM

## 2014-11-03 NOTE — Progress Notes (Signed)
SUBJECTIVE: Had some more chest pain last night and this AM. For now she is agreeable to [facility] which is scheduled for this afternoon.   OBJECTIVE:   Vitals:   Filed Vitals:   11/03/14 0537 11/03/14 0620 11/03/14 0657 11/03/14 0701  BP: 143/81 154/89 167/90 159/86  Pulse: 73     Temp: 97.9 F (36.6 C)     TempSrc: Oral     Resp: 18 23 22 22   Height:      Weight: 214 lb 8 oz (97.297 kg)     SpO2: 97% 100% 100% 100%   I&O's:    Intake/Output Summary (Last 24 hours) at 11/03/14 0744 Last data filed at 11/03/14 0100  Gross per 24 hour  Intake    480 ml  Output   1000 ml  Net   -520 ml   TELEMETRY: NSR with vent bigeminy and some 3 and 4 beat runs of NSVT. NSR.   PHYSICAL EXAM General: Well developed, well nourished, in no acute distress Head: Eyes PERRLA, No xanthomas.   Normal cephalic and atramatic  Lungs:   Clear bilaterally to auscultation and percussion. Heart:   HRRR S1 S2 Pulses are 2+ & equal. Abdomen: Bowel sounds are positive, abdomen soft and non-tender without masses Extremities:   No clubbing, cyanosis or edema.  DP +1 Neuro: Alert and oriented X 3. Psych:  Good affect, responds appropriately   LABS: Basic Metabolic Panel:  Recent Labs  11/02/14 1349 11/03/14 0441  NA 140 140  K 3.2* 4.0  CL 106 108  CO2 23 18*  GLUCOSE 126* 125*  BUN 28* 25*  CREATININE 1.24* 1.14*  CALCIUM 9.2 9.4   Liver Function Tests:  Recent Labs  11/02/14 0831 11/03/14 0441  AST 17 18  ALT 19 21  ALKPHOS 89 90  BILITOT 0.5 0.9  PROT 7.4 6.7  ALBUMIN 3.5 3.6   No results for input(s): LIPASE, AMYLASE in the last 72 hours. CBC:  Recent Labs  11/02/14 1349 11/03/14 0441  WBC 15.2* 12.3*  HGB 14.3 14.7  HCT 43.0 43.6  MCV 88.7 87.0  PLT 126* 123*   Coag Panel:   Lab Results  Component Value Date   INR 1.08 11/02/2014   INR 0.96 10/29/2014    RADIOLOGY: Dg Chest 2 View  10/30/2014   CLINICAL DATA:  Shortness of breath.  EXAM: CHEST  2 VIEW   COMPARISON:  October 29, 2014.  FINDINGS: Stable cardiomediastinal silhouette. No pneumothorax or pleural effusion is noted. Slightly increased interstitial densities are noted in both lower lobes concerning for pulmonary edema. Bony thorax appears to be intact.  IMPRESSION: Slightly increased bilateral lower lobe interstitial densities are noted concerning for pulmonary edema.   Electronically Signed   By: Marijo Conception, M.D.   On: 10/30/2014 17:48   Dg Chest 2 View  10/09/2014   CLINICAL DATA:  2-3  week history of chest congestion and head cold.  EXAM: CHEST - 2 VIEW  COMPARISON:  Two-view chest x-ray 09/25/2008  FINDINGS: The heart size is normal. Emphysematous changes are evident as before. Mild interstitial coarsening is stable. No focal airspace disease is evident. There is no edema or effusion to suggest failure. There is moderate tortuosity of aorta. Atherosclerotic changes are present at the arch.  IMPRESSION: 1. No acute cardiopulmonary disease or significant interval change. 2. Atherosclerosis. 3. Emphysema.   Electronically Signed   By: San Morelle M.D.   On: 10/09/2014 16:01  Dg Chest Port 1 View  11/02/2014   CLINICAL DATA:  Dyspnea  EXAM: PORTABLE CHEST - 1 VIEW  COMPARISON:  10/30/2014  FINDINGS: There is cardiomegaly and aortic tortuosity, unchanged. There is moderate vascular and interstitial prominence, unchanged. There is no confluent airspace consolidation. There is no large effusion.  IMPRESSION: Unchanged vascular and interstitial prominence, likely mild congestive heart failure.   Electronically Signed   By: Andreas Newport M.D.   On: 11/02/2014 06:43   Dg Chest Port 1 View  10/29/2014   CLINICAL DATA:  Acute chest pain at anterior RIGHT chest, soreness in shortness of breath, symptoms for a few days, history smoking, hypertension, asthma  EXAM: PORTABLE CHEST - 1 VIEW  COMPARISON:  Portable exam 1719 hours compared to 10/09/2014  FINDINGS: Enlargement of cardiac  silhouette.  Atherosclerotic calcification aorta.  Mediastinal contours and pulmonary vascularity normal.  Bronchitic changes with bibasilar atelectasis.  Cannot completely exclude LEFT basilar infiltrate.  Upper lungs clear.  No gross pleural effusion or pneumothorax.  IMPRESSION: Bronchitic changes with bibasilar atelectasis, cannot completely exclude LEFT basilar infiltrate.   Electronically Signed   By: Lavonia Dana M.D.   On: 10/29/2014 17:39    ASSESSMENT AND PLAN  [name] [name] is a 69 y.o. female with a history of tobacco abuse and HTN who was admitted to [facility]  for new onset atrial fibrillation and possible PNA  New onset Atrial Fibrillation: in the setting of possible PNA. She has been in and out of this since admission. Currently maintaining NSR on amio 200mg  BID and coreg 3.125mg  BID. Avoiding CCB in the setting of LV dysfunction.  -- Continue to treat underlying illness.  -- Troponin mildly positive but flat trend 0.07--> 0.07--> 0.07 -- TSH normal -- 2D ECHO with EF 35-40%, diffuse HK, and severe HK of inferior myocardium.  -- CHA2DS2 VASc score is at least 3 (HTN, female sex, Age 33-74) so she was placed on Eliquis 5 mg BID. This was discontinued with anticipated LHC and she was put on heparin gtt.   New onset cardiomyopathy- 2D ECHO with EF 35-40%, diffuse HK, and severe HK of inferior myocardium. WMA on echo suggests CAD as cause of decreased LV function. -- Troponin mildly positive but flat trend 0.07--> 0.07--> 0.07. 14 beat run of NSVT on tele.  -- Patient initially refused to sign consent. She states that she has had a anaphylactic reaction to contrast dye in the past; however, she is unable to say when or much more. She said her PCP, Dr. Deforest Hoyles would know. I called him and he will be out of the office until Monday. A nurse looked through he chart and reported no prior documentation of previous contrast induced anaphylaxis. Cath cancelled Friday and plan for Monday after  pre-medication with steroids. She agrees to this. -- She had some dyspnea that improved with IV diuresis. Will likely need maintenance dose diuretics after LHC. -- Continue BB. Hold on ACEi or ARB due to slight bump in creatinine with diuresis. Start after cath if renal function stable.   NSVT- 14 beats noted on tele. Some more 3 and 4 beat runs noted.  -- This could be ischemic. Recommend LHC as above. Continue BB for now.   Possible PNA: On antibiotics. Management per IM.   HTN: poorly controlled. amlodipine 5mg  added. Continue  coreg 3.125mg  BID.  Leukocytosis - most likely related to IV solumedrol given Friday in anticipation for cath that was not done.  Remains afebrile.  Tobacco  Abuse: smoking cessation strongly advised.   Hypokalemia- repleted    [name] Stanford, PA-C  11/03/2014  7:44 AM  Patient seen, examined. Available data reviewed. Agree with findings, assessment, and plan as outlined by Angelena Form, PA-C. The patient is independently interviewed and examined. Her lung fields are clear. Heart is regular rate and rhythm with frequent premature beats. There is no significant murmur. There is no peripheral edema.  I have reviewed all of her data. She went to the Cath Lab last week but had a panic attack. She is scheduled for cardiac catheterization and possible PCI today. I have reviewed the risks, indications, and alternatives with the patient. She agrees to proceed. She has been premedicated for a possible contrast allergy. Differential diagnosis for her cardiomyopathy includes tachycardia mediated (atrial fibrillation) versus ischemic heart disease as the most likely etiologies. Otherwise as outlined above.  Sherren Mocha, M.D. 11/03/2014 8:34 AM

## 2014-11-03 NOTE — Progress Notes (Signed)
PROGRESS NOTE  [name] [name] SPQ:[id number] DOB: March 14, 1946 DOA: 10/29/2014 PCP: [name] Low, MD   Received call from RN the patient coded and the cath lab. One to the Cath Lab patient was intubated, apparently she was fighting the Cath Lab personnel and she does not want to be flat on table, she was flailing her arms and showed extreme anxiety. Decision was made to give patient Ativan, patient [name] [name] and the floor was called to bring the Ativan, report patient had bowel movement and while Cath Lab staff was cleaning her up she developed asystole, just to be clear patient did not receive any Ativan or any sedating medicine. Patient received atropine and epinephrine and intubated. Transfer to the ICU for further care. PE is unlikely as patient was on heparin since admission, other possibilities include primary neurological events versus AMI, cannot rule out massive vasovagal reflex causing hypotension and extreme bradycardia/asystole.  HPI: [name] [name] is a 69 year old female with a past medical history of HTN, asthma, and arthritis who presented to the [name] ED on 10/29/14 with new onset atrial fibrillation with RVR. Patient was seen by her PCP on the day of admission complaining of 1 week of intermittent chest pain and was found to be in atrial fibrillation with HR in the 150s. EMS was called and patient was transported to [facility].   Since admission patient has spontaneously converted back to normal sinus rhythm. Patient was hypokalemic with potassium of 3.1 otherwise labs are unremarkable. CXR on admission showed bronchitic changes but could not rule out left basilar infiltrate. CXR on 10/30/14 had evidence of possible pulmonary edema. Patient started on Zithromax and Rocephin. Troponin was negative. HR decreased to 60s with diltiazem. Started on anticoagulation.   Patient was scheduled for cardiac cath on 10/31/14 but was scheduled due to panic attack. Since that time patient has been  reporting intermittent chest pain and shortness of breath. Delta troponin have remained slightly elevated at 0.07. BNP was also slightly elevated at 202.1. She is scheduled to undergo cardiac cath later today.   Subjective: Today, [name] [name] is feeling better and agreeable to the cardiac cath. She is currently pain free and resting comfortably though she admits to have a difficult night. Patient reports that overnight she had several episodes of pain and anxiety which were worsened by her inability to sleep. She is anxious for her procedure to be over and to get home.   Assessment/Plan: Atrial fibrillation - Patient presented with new onset paroxysmal atrial fibrillation with RVR - Patient given diltiazem drip for rate control  - TSH was normal at 1.152 - Troponin negative at 0.04 - CHADsVasc score of 4 (CHF, female gender, age of 35 and hypertension) started on oral Eliquis 5mg  daily - Currently anticoagulated with heparin, heart rate is not controlled. - Coreg at 3.125, amlodipine at 5, and amiodarone 200mg  BID. - Continue above medications    CAP (community acquired pneumonia) - Patient presented with cough, fatigue, and congestion  - Initial CXR showed bronchitic changes but could not rule out left basilar infiltrate  - Started on Rochepin and Zithromax - Patient did not have fever, leukocytosis or productive cough, x-rays now look more like fluids. - Antibiotic discontinued on 11/01/14, continue to monitor.  Cardiomyopathy, likely ischemic cardiomyopathy - 2-D echocardiogram showed ejection fraction of 35-40% with increased filling pressure consistent with diastolic dysfunction. - Patient seen by cardiology, patient agreed for cardiac cath, did not tolerate it secondary to shortness of breath (orthopnea),  patient is rescheduled for today - Delta troponin are slightly elevated at 0.07. BNP 202.1. - On amiodarone 200mg  BID, and Coreg 3.125 - Given 1 dose of Lasix on 10/30/14, repeat  CXR shows possible pulmonary edema - Given around 100 mg of Lasix 10/31/14, creatinine increased to 1.29 so Lasix held - Creatinine today has improved to 1.14, patient appears euvolemic, will continue to monitor. - Patient scheduled for cardiac cath later today (11/03/14)   Leukocytosis -WBC increased from 8.5-14.6-17.3, this is likely secondary to the steroids patient got prior to the cath. -This is coincide with the discontinuation of antibiotics, but patient still has no fever or chills. -Today has improved to 12.3. Will continue to monitor with daily CBC  Hypokalemia - Potassium improved after oral placement. - Resolved, today potassium was 4.0.  - Will monitor with daily BMP  DVT Prophylaxis:  Patient on continuous heparin infusion   Code Status: Full  Family Communication: No family at bedside  Disposition Plan: Remain inpatient   Consultants:   Cardiology   Procedures:  Echocardiogram on 10/30/14 showing EF of 35-45%  Scheduled for cardiac cath this afternoon (11/03/14)  Antibiotics: Anti-infectives    Start     Dose/Rate Route Frequency Ordered Stop   10/29/14 1945  cefTRIAXone (ROCEPHIN) 1 g in dextrose 5 % 50 mL IVPB  Status:  Discontinued     1 g 100 mL/hr over 30 Minutes Intravenous Every 24 hours 10/29/14 1940 11/01/14 1201   10/29/14 1945  azithromycin (ZITHROMAX) 500 mg in dextrose 5 % 250 mL IVPB  Status:  Discontinued     500 mg 250 mL/hr over 60 Minutes Intravenous Every 24 hours 10/29/14 1940 11/01/14 1201      Objective: Filed Vitals:   11/03/14 0537 11/03/14 0620 11/03/14 0657 11/03/14 0701  BP: 143/81 154/89 167/90 159/86  Pulse: 73     Temp: 97.9 F (36.6 C)     TempSrc: Oral     Resp: 18 23 22 22   Height:      Weight: 97.297 kg (214 lb 8 oz)     SpO2: 97% 100% 100% 100%    Intake/Output Summary (Last 24 hours) at 11/03/14 1046 Last data filed at 11/03/14 1023  Gross per 24 hour  Intake    240 ml  Output   1325 ml  Net  -1085 ml    Filed Weights   11/01/14 0500 11/02/14 0500 11/03/14 0537  Weight: 96.707 kg (213 lb 3.2 oz) 98.158 kg (216 lb 6.4 oz) 97.297 kg (214 lb 8 oz)    Exam: General: Well developed, well nourished, patient sitting on edge of bed NAD, appears stated age  57: Anicteic Sclera Neck: Supple Cardiovascular: RRR, S1 S2 auscultated, no rubs, murmurs or gallops.   Respiratory: Clear to auscultation bilaterally with equal chest rise- no rhonchi, rales, or wheezes Abdomen: Soft, nontender, nondistended, + bowel sounds  Extremities: warm dry without cyanosis clubbing or edema.  Neuro: AAOx3. Strength 5/5 in upper and lower extremities  Skin: Without rashes exudates or nodules.   Psych: Normal affect and demeanor with intact judgement and insight  Data Reviewed: Basic Metabolic Panel:  Recent Labs Lab 10/29/14 1650  10/31/14 0555 11/01/14 0425 11/02/14 0831 11/02/14 1349 11/03/14 0441  NA 139  < > 141 139 140 140 140  K 3.1*  < > 3.3* 3.8 3.7 3.2* 4.0  CL 106  < > 110 108 109 106 108  CO2 21  < > 25 22 23 23  18*  GLUCOSE 108*  < > 107* 176* 113* 126* 125*  BUN 17  < > 12 20 28* 28* 25*  CREATININE 1.03  < > 0.97 1.31* 1.29* 1.24* 1.14*  CALCIUM 9.8  < > 9.1 9.6 9.1 9.2 9.4  MG 1.9  --   --   --   --   --   --   PHOS 4.0  --   --   --   --   --   --   < > = values in this interval not displayed. Liver Function Tests:  Recent Labs Lab 10/30/14 0445 10/31/14 0555 11/01/14 0425 11/02/14 0831 11/03/14 0441  AST 17 16 19 17 18   ALT 14 14 15 19 21   ALKPHOS 97 100 96 89 90  BILITOT 0.8 0.9 0.6 0.5 0.9  PROT 6.9 7.1 7.8 7.4 6.7  ALBUMIN 3.5 3.4* 3.6 3.5 3.6   CBC:  Recent Labs Lab 10/30/14 0445 10/31/14 0555 11/01/14 0425 11/02/14 0831 11/02/14 1349 11/03/14 0441  WBC 9.4 8.5 14.6* 17.3* 15.2* 12.3*  NEUTROABS 6.0  --   --   --   --   --   HGB 14.7 15.0 15.1* 14.3 14.3 14.7  HCT 43.5 44.5 45.0 43.5 43.0 43.6  MCV 87.0 87.4 87.7 89.5 88.7 87.0  PLT 147* 136* 145* 133*  126* 123*   Cardiac Enzymes:  Recent Labs Lab 10/30/14 1500 10/30/14 1820 10/31/14 0149  TROPONINI 0.07* 0.07* 0.07*   BNP (last 3 results)  Recent Labs  10/30/14 1703  BNP 202.1*   CBG:  Recent Labs Lab 10/30/14 1218  GLUCAP 105*    Recent Results (from the past 240 hour(s))  Culture, blood (routine x 2) Call MD if unable to obtain prior to antibiotics being given     Status: None (Preliminary result)   Collection Time: 10/29/14 10:10 PM  Result Value Ref Range Status   Specimen Description BLOOD LEFT WRIST  Final   Special Requests BOTTLES DRAWN AEROBIC ONLY 5CC  Final   Culture   Final           BLOOD CULTURE RECEIVED NO GROWTH TO DATE CULTURE WILL BE HELD FOR 5 DAYS BEFORE ISSUING A FINAL NEGATIVE REPORT Performed at [facility]    Report Status PENDING  Incomplete  Culture, blood (routine x 2) Call MD if unable to obtain prior to antibiotics being given     Status: None (Preliminary result)   Collection Time: 10/29/14 10:15 PM  Result Value Ref Range Status   Specimen Description BLOOD LT HAND  Final   Special Requests BOTTLES DRAWN AEROBIC ONLY [place]  Final   Culture   Final           BLOOD CULTURE RECEIVED NO GROWTH TO DATE CULTURE WILL BE HELD FOR 5 DAYS BEFORE ISSUING A FINAL NEGATIVE REPORT Performed at [facility]    Report Status PENDING  Incomplete  Culture, sputum-assessment     Status: None   Collection Time: 10/30/14  5:51 PM  Result Value Ref Range Status   Specimen Description SPUTUM  Final   Special Requests NONE  Final   Sputum evaluation   Final    MICROSCOPIC FINDINGS SUGGEST THAT THIS SPECIMEN IS NOT REPRESENTATIVE OF LOWER RESPIRATORY SECRETIONS. PLEASE RECOLLECT. Rosalie Gums RN 1660 10/30/14 A BROWNING    Report Status 10/30/2014 FINAL  Final     Studies: Dg Chest Port 1 View  11/02/2014   CLINICAL DATA:  Dyspnea  EXAM: PORTABLE CHEST - 1 VIEW  COMPARISON:  10/30/2014  FINDINGS: There is cardiomegaly and aortic  tortuosity, unchanged. There is moderate vascular and interstitial prominence, unchanged. There is no confluent airspace consolidation. There is no large effusion.  IMPRESSION: Unchanged vascular and interstitial prominence, likely mild congestive heart failure.   Electronically Signed   By: Andreas Newport M.D.   On: 11/02/2014 06:43    Scheduled Meds: . amiodarone  200 mg Oral BID  . amLODipine  5 mg Oral Daily  . aspirin EC  81 mg Oral Daily  . carvedilol  3.125 mg Oral BID WC  . diphenhydrAMINE  25 mg Intravenous Pre-Cath  . docusate sodium  100 mg Oral Daily  . famotidine (PEPCID) IV  20 mg Intravenous Pre-Cath  . guaiFENesin  1,200 mg Oral BID  . LORazepam  2 mg Intravenous Q4H  . predniSONE  60 mg Oral Pre-Cath  . ranitidine  150 mg Oral Daily  . sodium chloride  3 mL Intravenous Q12H  . sodium chloride  3 mL Intravenous Q12H   Continuous Infusions: . sodium chloride 20 mL/hr at 11/03/14 0549  . heparin 1,300 Units/hr (11/03/14 0950)    Active Problems:   HTN (hypertension)   Atrial fibrillation   CAP (community acquired pneumonia)   Cardiomyopathy   Hypokalemia   Acute on chronic combined systolic and diastolic HF (heart failure)   Acute chest pain   SOB (shortness of breath)   Atrial fibrillation with RVR    Raspect, Erin, PA-S [facility] 11/03/2014, 10:46 AM   Addendum  Patient seen and examined, chart and data base reviewed.  I agree with the above assessment and plan.  For full details please see Mrs. Raspect, Erin, PA-S note.  I reviewed and amended the above note as appropriate.   Birdie Hopes, MD [facility] Pager: [phone] 11/03/2014, 5:38 PM

## 2014-11-03 NOTE — CV Procedure (Signed)
Entered in error

## 2014-11-03 NOTE — Procedures (Signed)
Central Venous Catheter Insertion Procedure Note [name] [name] [id number] 16-Jun-1946  Procedure: Insertion of Central Venous Catheter Indications: Assessment of intravascular volume, Drug and/or fluid administration and Frequent blood sampling  Procedure Details Consent: Unable to obtain consent because of emergent medical necessity. Time Out: Verified patient identification, verified procedure, site/side was marked, verified correct patient position, special equipment/implants available, medications/allergies/relevent history reviewed, required imaging and test results available.  Performed  Maximum sterile technique was used including antiseptics, cap, gloves, gown, hand hygiene, mask and sheet. Skin prep: Chlorhexidine; local anesthetic administered A antimicrobial bonded/coated triple lumen catheter was placed in the right internal jugular vein using the Seldinger technique. Ultrasound guidance used.Yes.   Catheter placed to 16 cm. Blood aspirated via all 3 ports and then flushed x 3. Line sutured x 2 and dressing applied.  Evaluation Blood flow good Complications: No apparent complications Patient did tolerate procedure well. Chest X-ray ordered to verify placement.  CXR: pending.  Georgann Housekeeper, AGACNP-BC [facility] Pulmonology/Critical Care Pager [phone] or [phone]

## 2014-11-03 NOTE — Anesthesia Procedure Notes (Signed)
Procedure Name: Intubation Date/Time: 11/03/2014 5:01 PM Performed by: Judeth Cornfield T Pre-anesthesia Checklist: Patient identified, Suction available and Patient being monitored Patient Re-evaluated:Patient Re-evaluated prior to inductionOxygen Delivery Method: Ambu bag Preoxygenation: Pre-oxygenation with 100% oxygen Laryngoscope Size: Mac and 3 Grade View: Grade I Tube type: Subglottic suction tube Tube size: 8.0 mm Number of attempts: 1 Airway Equipment and Method: Video-laryngoscopy and Stylet Placement Confirmation: ETT inserted through vocal cords under direct vision,  positive ETCO2,  CO2 detector and breath sounds checked- equal and bilateral Secured at: 22 cm Tube secured with: Tape Dental Injury: Teeth and Oropharynx as per pre-operative assessment

## 2014-11-03 NOTE — Progress Notes (Signed)
ANTICOAGULATION CONSULT NOTE - Follow Up Consult  Pharmacy Consult for Heparin (apixaban on hold) Indication: atrial fibrillation  Allergies  Allergen Reactions  . Bee Venom Anaphylaxis  . Shrimp [Shellfish Allergy] Swelling    Patient Measurements: Height: 5' 7.5" (171.5 cm) Weight: 214 lb 8 oz (97.297 kg) IBW/kg (Calculated) : 62.75  Heparin dosing wt ~ 86 kg  Vital Signs: Temp: 97.5 F (36.4 C) (03/21 1329) Temp Source: Oral (03/21 1329) BP: 168/90 mmHg (03/21 1329) Pulse Rate: 120 (03/21 1611)  Labs:  Recent Labs  11/01/14 0425  11/02/14 0200 11/02/14 0831 11/02/14 1349 11/03/14 0441 11/03/14 1820  HGB 15.1*  --   --  14.3 14.3 14.7  --   HCT 45.0  --   --  43.5 43.0 43.6  --   PLT 145*  --   --  133* 126* 123*  --   APTT 72*  --   --  92*  --   --  26  LABPROT  --   --   --   --  14.1  --  15.1  INR  --   --   --   --  1.08  --  1.17  HEPARINUNFRC 0.56  < > 0.49 0.66  --  0.29*  --   CREATININE 1.31*  --   --  1.29* 1.24* 1.14* 1.49*  TROPONINI  --   --   --   --   --   --  0.08*  < > = values in this interval not displayed.  Estimated Creatinine Clearance: 43.1 mL/min (by C-G formula based on Cr of 1.49).  Assessment: 69 year old female on IV heparin (apixaban on hold) for atrial fibrillation and and she is also noted for cath today (EF= 35-40% and severe hypokinesis of inferior myocardium). Heparin level this am is 0.29 and is just below goal.   Now s/p reattempt at cath, but pt coded in cath lab.  Initiated on [employer] and pharmacy asked to resume IV heparin.  Now that she is hypothermic, will need lower heparin infusion rate.  Goal of Therapy:  Heparin level 0.3-0.7 units/mL aPTT 66-102 seconds  Monitor platelets by anticoagulation protocol: Yes   Plan:  -Resume IV heparin at 800 units/hr. -Check heparin level 6 hrs after gtt starts. -Daily CBC/HL  Uvaldo Rising, BCPS  Clinical Pharmacist Pager [phone]  11/03/2014 8:07  PM

## 2014-11-03 NOTE — Progress Notes (Signed)
[place] Progress Note Patient Name: [name] [name] DOB: 09-Oct-1945 MRN: [id number]   Date of Service  11/03/2014  HPI/Events of Note  hyperglyecmic K 3.7  eICU Interventions  SSI KCL 30 mEq     Intervention Category Minor Interventions: Routine modifications to care plan (e.g. PRN medications for pain, fever)  Ervey Fallin 11/03/2014, 10:25 PM

## 2014-11-04 ENCOUNTER — Inpatient Hospital Stay (HOSPITAL_COMMUNITY): Payer: Commercial Managed Care - HMO

## 2014-11-04 ENCOUNTER — Encounter (HOSPITAL_COMMUNITY)
Admission: EM | Disposition: A | Discharge status: Home / Self Care | Payer: Self-pay | Source: Home / Self Care | Attending: Thoracic Surgery (Cardiothoracic Vascular Surgery)

## 2014-11-04 DIAGNOSIS — I2583 Coronary atherosclerosis due to lipid rich plaque: Secondary | ICD-10-CM

## 2014-11-04 DIAGNOSIS — I25110 Atherosclerotic heart disease of native coronary artery with unstable angina pectoris: Secondary | ICD-10-CM

## 2014-11-04 DIAGNOSIS — I2510 Atherosclerotic heart disease of native coronary artery without angina pectoris: Secondary | ICD-10-CM | POA: Diagnosis present

## 2014-11-04 DIAGNOSIS — I469 Cardiac arrest, cause unspecified: Secondary | ICD-10-CM | POA: Diagnosis present

## 2014-11-04 HISTORY — PX: LEFT HEART CATHETERIZATION WITH CORONARY ANGIOGRAM: SHX5451

## 2014-11-04 HISTORY — PX: TEMPORARY PACEMAKER INSERTION: SHX5471

## 2014-11-04 LAB — POCT I-STAT, CHEM 8
BUN: 23 mg/dL (ref 6–23)
BUN: 22 mg/dL (ref 6–23)
Calcium, Ion: 1.16 mmol/L (ref 1.13–1.30)
Calcium, Ion: 1.17 mmol/L (ref 1.13–1.30)
Chloride: 110 mmol/L (ref 96–112)
Chloride: 112 mmol/L (ref 96–112)
Creatinine, Ser: 1.3 mg/dL — ABNORMAL HIGH (ref 0.50–1.10)
Creatinine, Ser: 1.1 mg/dL (ref 0.50–1.10)
Glucose, Bld: 186 mg/dL — ABNORMAL HIGH (ref 70–99)
Glucose, Bld: 167 mg/dL — ABNORMAL HIGH (ref 70–99)
HCT: 38 % (ref 36.0–46.0)
HCT: 42 % (ref 36.0–46.0)
Hemoglobin: 12.9 g/dL (ref 12.0–15.0)
Hemoglobin: 14.3 g/dL (ref 12.0–15.0)
Potassium: 3.3 mmol/L — ABNORMAL LOW (ref 3.5–5.1)
Potassium: 3.7 mmol/L (ref 3.5–5.1)
Sodium: 145 mmol/L (ref 135–145)
Sodium: 146 mmol/L — ABNORMAL HIGH (ref 135–145)
TCO2: 18 mmol/L (ref 0–100)
TCO2: 18 mmol/L (ref 0–100)

## 2014-11-04 LAB — GLUCOSE, CAPILLARY
Glucose-Capillary: 139 mg/dL — ABNORMAL HIGH (ref 70–99)
Glucose-Capillary: 152 mg/dL — ABNORMAL HIGH (ref 70–99)
Glucose-Capillary: 163 mg/dL — ABNORMAL HIGH (ref 70–99)
Glucose-Capillary: 168 mg/dL — ABNORMAL HIGH (ref 70–99)
Glucose-Capillary: 169 mg/dL — ABNORMAL HIGH (ref 70–99)
Glucose-Capillary: 176 mg/dL — ABNORMAL HIGH (ref 70–99)
Glucose-Capillary: 177 mg/dL — ABNORMAL HIGH (ref 70–99)
Glucose-Capillary: 186 mg/dL — ABNORMAL HIGH (ref 70–99)
Glucose-Capillary: 201 mg/dL — ABNORMAL HIGH (ref 70–99)
Glucose-Capillary: 141 mg/dL — ABNORMAL HIGH (ref 70–99)
Glucose-Capillary: 117 mg/dL — ABNORMAL HIGH (ref 70–99)
Glucose-Capillary: 119 mg/dL — ABNORMAL HIGH (ref 70–99)
Glucose-Capillary: 91 mg/dL (ref 70–99)
Glucose-Capillary: 88 mg/dL (ref 70–99)

## 2014-11-04 LAB — BLOOD GAS, ARTERIAL
Acid-base deficit: 4.2 mmol/L — ABNORMAL HIGH (ref 0.0–2.0)
Acid-base deficit: 4.7 mmol/L — ABNORMAL HIGH (ref 0.0–2.0)
Bicarbonate: 21.5 mEq/L (ref 20.0–24.0)
Bicarbonate: 18.7 mEq/L — ABNORMAL LOW (ref 20.0–24.0)
Drawn by: 224201
FIO2: 0.6 %
FIO2: 0.6 %
MECHVT: 500 mL
O2 Saturation: 98.3 %
O2 Saturation: 98.9 %
PEEP: 10 cmH2O
PEEP: 8 cmH2O
Patient temperature: 98.6
Patient temperature: 98.6
RATE: 20 resp/min
RATE: 28 resp/min
TCO2: 23 mmol/L (ref 0–100)
TCO2: 19.5 mmol/L (ref 0–100)
pCO2 arterial: 47.6 mmHg — ABNORMAL HIGH (ref 35.0–45.0)
pCO2 arterial: 27.6 mmHg — ABNORMAL LOW (ref 35.0–45.0)
pH, Arterial: 7.277 — ABNORMAL LOW (ref 7.350–7.450)
pH, Arterial: 7.446 (ref 7.350–7.450)
pO2, Arterial: 144 mmHg — ABNORMAL HIGH (ref 80.0–100.0)
pO2, Arterial: 157 mmHg — ABNORMAL HIGH (ref 80.0–100.0)

## 2014-11-04 LAB — BASIC METABOLIC PANEL
Anion gap: 7 (ref 5–15)
Anion gap: 7 (ref 5–15)
Anion gap: 5 (ref 5–15)
Anion gap: 5 (ref 5–15)
Anion gap: 7 (ref 5–15)
BUN: 22 mg/dL (ref 6–23)
BUN: 19 mg/dL (ref 6–23)
BUN: 19 mg/dL (ref 6–23)
BUN: 20 mg/dL (ref 6–23)
BUN: 21 mg/dL (ref 6–23)
CO2: 21 mmol/L (ref 19–32)
CO2: 22 mmol/L (ref 19–32)
CO2: 25 mmol/L (ref 19–32)
CO2: 19 mmol/L (ref 19–32)
CO2: 18 mmol/L — ABNORMAL LOW (ref 19–32)
Calcium: 8.2 mg/dL — ABNORMAL LOW (ref 8.4–10.5)
Calcium: 8.2 mg/dL — ABNORMAL LOW (ref 8.4–10.5)
Calcium: 8.2 mg/dL — ABNORMAL LOW (ref 8.4–10.5)
Calcium: 8.4 mg/dL (ref 8.4–10.5)
Calcium: 8.6 mg/dL (ref 8.4–10.5)
Chloride: 113 mmol/L — ABNORMAL HIGH (ref 96–112)
Chloride: 111 mmol/L (ref 96–112)
Chloride: 112 mmol/L (ref 96–112)
Chloride: 117 mmol/L — ABNORMAL HIGH (ref 96–112)
Chloride: 116 mmol/L — ABNORMAL HIGH (ref 96–112)
Creatinine, Ser: 1.06 mg/dL (ref 0.50–1.10)
Creatinine, Ser: 1.1 mg/dL (ref 0.50–1.10)
Creatinine, Ser: 1.13 mg/dL — ABNORMAL HIGH (ref 0.50–1.10)
Creatinine, Ser: 1.15 mg/dL — ABNORMAL HIGH (ref 0.50–1.10)
Creatinine, Ser: 1.06 mg/dL (ref 0.50–1.10)
GFR calc Af Amer: 61 mL/min — ABNORMAL LOW (ref >90)
GFR calc Af Amer: 58 mL/min — ABNORMAL LOW (ref >90)
GFR calc Af Amer: 56 mL/min — ABNORMAL LOW (ref >90)
GFR calc Af Amer: 55 mL/min — ABNORMAL LOW (ref >90)
GFR calc Af Amer: 61 mL/min — ABNORMAL LOW (ref >90)
GFR calc non Af Amer: 52 mL/min — ABNORMAL LOW (ref >90)
GFR calc non Af Amer: 50 mL/min — ABNORMAL LOW (ref >90)
GFR calc non Af Amer: 48 mL/min — ABNORMAL LOW (ref >90)
GFR calc non Af Amer: 47 mL/min — ABNORMAL LOW (ref >90)
GFR calc non Af Amer: 52 mL/min — ABNORMAL LOW (ref >90)
Glucose, Bld: 199 mg/dL — ABNORMAL HIGH (ref 70–99)
Glucose, Bld: 181 mg/dL — ABNORMAL HIGH (ref 70–99)
Glucose, Bld: 150 mg/dL — ABNORMAL HIGH (ref 70–99)
Glucose, Bld: 91 mg/dL (ref 70–99)
Glucose, Bld: 89 mg/dL (ref 70–99)
Potassium: 3.9 mmol/L (ref 3.5–5.1)
Potassium: 4.2 mmol/L (ref 3.5–5.1)
Potassium: 4 mmol/L (ref 3.5–5.1)
Potassium: 3.3 mmol/L — ABNORMAL LOW (ref 3.5–5.1)
Potassium: 3.6 mmol/L (ref 3.5–5.1)
Sodium: 141 mmol/L (ref 135–145)
Sodium: 140 mmol/L (ref 135–145)
Sodium: 142 mmol/L (ref 135–145)
Sodium: 141 mmol/L (ref 135–145)
Sodium: 141 mmol/L (ref 135–145)

## 2014-11-04 LAB — APTT: aPTT: 53 seconds — ABNORMAL HIGH (ref 24–37)

## 2014-11-04 LAB — PROTIME-INR
INR: 1.15 (ref 0.00–1.49)
Prothrombin Time: 14.9 seconds (ref 11.6–15.2)

## 2014-11-04 LAB — CBC
HCT: 40.6 % (ref 36.0–46.0)
Hemoglobin: 13.7 g/dL (ref 12.0–15.0)
MCH: 29.7 pg (ref 26.0–34.0)
MCHC: 33.7 g/dL (ref 30.0–36.0)
MCV: 87.9 fL (ref 78.0–100.0)
Platelets: 154 10*3/uL (ref 150–400)
RBC: 4.62 MIL/uL (ref 3.87–5.11)
RDW: 16.1 % — ABNORMAL HIGH (ref 11.5–15.5)
WBC: 13.9 10*3/uL — ABNORMAL HIGH (ref 4.0–10.5)

## 2014-11-04 LAB — PHOSPHORUS: Phosphorus: 4.7 mg/dL — ABNORMAL HIGH (ref 2.3–4.6)

## 2014-11-04 LAB — HEPARIN LEVEL (UNFRACTIONATED): Heparin Unfractionated: 0.13 IU/mL — ABNORMAL LOW (ref 0.30–0.70)

## 2014-11-04 LAB — LACTIC ACID, PLASMA: Lactic Acid, Venous: 1.4 mmol/L (ref 0.5–2.0)

## 2014-11-04 LAB — TROPONIN I
Troponin I: 0.25 ng/mL — ABNORMAL HIGH (ref <0.031)
Troponin I: 0.19 ng/mL — ABNORMAL HIGH (ref <0.031)

## 2014-11-04 LAB — MAGNESIUM: Magnesium: 2.2 mg/dL (ref 1.5–2.5)

## 2014-11-04 SURGERY — LEFT HEART CATHETERIZATION WITH CORONARY ANGIOGRAM

## 2014-11-04 MED ORDER — SODIUM CHLORIDE 0.9 % IV SOLN
INTRAVENOUS | Status: DC
  Administered 2014-11-04: 50 mL/h via INTRAVENOUS
  Administered 2014-11-05 – 2014-11-07 (×2): via INTRAVENOUS

## 2014-11-04 MED ORDER — ASPIRIN EC 81 MG PO TBEC
81.0000 mg | DELAYED_RELEASE_TABLET | Freq: Every day | ORAL | Status: DC
  Administered 2014-11-05 – 2014-11-13 (×9): 81 mg via ORAL
  Filled 2014-11-04 (×10): qty 1

## 2014-11-04 MED ORDER — CHLORHEXIDINE GLUCONATE 0.12 % MT SOLN
15.0000 mL | Freq: Two times a day (BID) | OROMUCOSAL | Status: DC
  Administered 2014-11-04 – 2014-11-08 (×10): 15 mL via OROMUCOSAL
  Filled 2014-11-04 (×10): qty 15

## 2014-11-04 MED ORDER — LIDOCAINE HCL (PF) 1 % IJ SOLN
INTRAMUSCULAR | Status: AC
  Filled 2014-11-04: qty 30

## 2014-11-04 MED ORDER — HEPARIN (PORCINE) IN NACL 2-0.9 UNIT/ML-% IJ SOLN
INTRAMUSCULAR | Status: AC
  Filled 2014-11-04: qty 1000

## 2014-11-04 MED ORDER — ONDANSETRON HCL 4 MG/2ML IJ SOLN: 4.0000 mg | Freq: Four times a day (QID) | INTRAMUSCULAR | Status: DC | PRN

## 2014-11-04 MED ORDER — ACETAMINOPHEN 325 MG PO TABS
650.0000 mg | ORAL_TABLET | ORAL | Status: DC | PRN
  Administered 2014-11-08 – 2014-11-10 (×6): 650 mg via ORAL
  Filled 2014-11-04 (×6): qty 2

## 2014-11-04 MED ORDER — HEPARIN (PORCINE) IN NACL 100-0.45 UNIT/ML-% IJ SOLN
1600.0000 [IU]/h | INTRAMUSCULAR | Status: AC
  Administered 2014-11-05 – 2014-11-07 (×3): 1200 [IU]/h via INTRAVENOUS
  Administered 2014-11-08 – 2014-11-13 (×7): 1600 [IU]/h via INTRAVENOUS
  Filled 2014-11-04 (×24): qty 250

## 2014-11-04 MED ORDER — POTASSIUM CHLORIDE 20 MEQ/15ML (10%) PO SOLN
40.0000 meq | Freq: Once | ORAL | Status: AC
  Administered 2014-11-04: 40 meq
  Filled 2014-11-04 (×2): qty 30

## 2014-11-04 MED ORDER — ATORVASTATIN CALCIUM 80 MG PO TABS
80.0000 mg | ORAL_TABLET | Freq: Every day | ORAL | Status: DC
  Administered 2014-11-04 – 2014-11-15 (×10): 80 mg via ORAL
  Filled 2014-11-04 (×13): qty 1

## 2014-11-04 MED ORDER — CETYLPYRIDINIUM CHLORIDE 0.05 % MT LIQD
7.0000 mL | Freq: Four times a day (QID) | OROMUCOSAL | Status: DC
  Administered 2014-11-04 – 2014-11-09 (×20): 7 mL via OROMUCOSAL

## 2014-11-04 NOTE — Progress Notes (Signed)
[place] Progress Note Patient Name: [name] [name] DOB: 11-Jan-1946 MRN: [id number]   Date of Service  11/04/2014  HPI/Events of Note  Call from attending cardiology concerning bradycardia in patient on the hypothermia protocol.  Patient's HR is now 19.  Did received CoReg earlier.  Taken off propofol and placed on versed/fentanyl gtts.  Remains paralyzed.  eICU Interventions  Plan: Change temp goal to 36 C Continue other interventions. Continue to monitor     Intervention Category Intermediate Interventions: Other:  Maleko Greulich 11/04/2014, 12:01 AM

## 2014-11-04 NOTE — CV Procedure (Signed)
[name] [name] is a 69 y.o. female    [id number]  [id number] LOCATION:  FACILITY: [facility]  PHYSICIAN: Troy Sine, MD, [facility] [birth year]   DATE OF PROCEDURE:  11/04/2014    CARDIAC CATHETERIZATION     HISTORY:    [name] [name] is a 69 y.o. female who is brought to the laboratory today intubated, paralyzed, and sedated from the cardiac care unit.  She had been on hypothermia protocol overnight markedly bradycardic and had to be rewarmed.  She has had marked bradycardia.  She had had mildly positive troponin and had developed new onset atrial fibrillation.  She suffered a cardiac arrest yesterday and her planned catheterization was aborted.  She presents to the Cath Lab today for cardiac catheterization and temporary pacemaker insertion.   PROCEDURE: Transvenous temporary pacemaker insertion; Left heart catheterization: coronary angiography, selective angiography into the left subclavian with visualization of the LIMA vessel, left ventriculography.  The patient was brought to the [facility] cardiac catherization laboratory intubated, sedated, and paralyzed on a ventilator.  Her right groin was prepped and shaved in usual sterile fashion. Xylocaine 1% was used for local anesthesia. A 6 French sheath was inserted into the R femoral artery.  A 6 French sheath was inserted into the right femoral vein.  A temporary pacemaker was inserted and advanced to the RV apex with excellent capture threshold.  Diagnostic catheterizatiion was done with 5 [place] FL5, FR4, and pigtail catheters. Left ventriculography was done with 28 cc Omnipaque contrast. Hemostasis of the left femoral artery was obtained by direct manual compression.  The temporary pacemaker was sutured in place.  The patient tolerated the procedure well.   HEMODYNAMICS:   Central Aorta: 130/72   Left Ventricle: 130/14/25  ANGIOGRAPHY:  Left main: Large vessel which trifurcated into a large LAD, a ramus intermediate vessel, and large  left circumflex artery artery.  There was focal 80% distal left main stenosis.  LAD: Large caliber vessel which gave rise to 2 diagonal branches.  Several septal perforating arteries and extended to and wrapped around the LV apex.  The  LAD was free of significant disease  Ramus Intermediate: Small moderate size vessel that was normal.  Left circumflex: Large vessel which gave rise to one major marginal branch.  There was 80% stenosis in the AV groove circumflex proximal to the takeoff of this marginal vessel.   Right coronary artery: Large angiographically normal vessel which supplied the PDA and small posterolateral vessel  Selective angiography into the left subclavian revealed no evidence for stenosis and a widely patent unbypassed LIMA vessel, which will be used for CABG surgery.  Left ventriculography revealed diffuse hypocontractility with an ejection fraction of 35-40%.   IMPRESSION:  Moderate LV dysfunction secondary to ischemic cardiomyopathy with an ejection fraction of 35-40%.  Significant coronary obstructive disease with 80% focal distal left main stenosis and 80% stenosis in the AV groove circumflex vessel proximal to the OM1 takeoff; normal LAD and RCA vessels.  Insertion of temporary pacemaker for marked bradycardia with initial heart rates in the upper 30s.  RECOMMENDATION:  Surgical consultation for CABG revascularization surgery.    Troy Sine, MD, [facility] 11/04/2014 12:16 PM

## 2014-11-04 NOTE — Progress Notes (Signed)
PULMONARY / CRITICAL CARE MEDICINE   Name: [name] [name] MRN: [id number] DOB: 1946-03-24    ADMISSION DATE:  10/29/2014 CONSULTATION DATE:  11/03/2014  REFERRING MD :  Hartford Poli  CHIEF COMPLAINT:  Arrest  INITIAL PRESENTATION:   69 year old female with PMH as below, which is significant for HTN and asthma. She presented to [facility] ED 3/16 c/o chest tightness and weakness x 1 week. In ED she was found to be in new onset  AFib with RVR. Notably she recently had URI symptoms and had been taking OTC cold medications. She was started on dilt gtt and admitted. She was seen by cardiology on day of admission who recommended anticoagulation. Echo showed a new cardiomyopathy with EF 35-45% and diffuse hypokineses. Troponin was mildly elevated but was a flat trend and she was scheduled for LHC on 3/18, however she refused due to a reported IV dye allergy, when she got to the cath lab she became dyspneic which appeared to have an anxiety component and cath as pushed to the following Monday 3/21. She was taken for cath 3/21 but again developed acute onset SOB and tachypnea. She felt as though she could not breathe, however sats remained 100% on [place]. She progressed to have a cardiac arrest, which those present at the time likely started as a respiratory arrest. She underwent ACLS for 5-10 mins. PEA resolved with atropine and epi x 1. No purposeful movement.interaction post arrest. PCCM called 11/03/14     STUDIES:  Echo > LVEF 35-40 %, severe hypokinesis of inferolateral myocardium. Decreased diastolic compliance. PA press 97mm/Hg. CTA chest 3/21 > CT head 3/21 > no acute intracranial abnormality  SIGNIFICANT EVENTS: 3/16 > admitted to [facility] with new onset Afib RVR and cardiomyopathy 3/18 > was taken for cath which was postponed due to anxiety and resp. distress.  3/21 > to cath lab, again developed SOB > cardiac arrest 10 mins > intubated . To ICU, hypothermia started   SUBJECTIVE:  3./22/16: Intolerant t  hypothermia and converted to normothermia protocol. Going to Cath lab today. Sedated and paralyzed  VITAL SIGNS: Temp:  [91.2 F (32.9 C)-98.4 F (36.9 C)] 97 F (36.1 C) (03/22 0900) Pulse Rate:  [30-120] 36 (03/22 0900) Resp:  [8-22] 20 (03/22 0400) BP: (91-175)/(39-145) 100/46 mmHg (03/22 0900) SpO2:  [95 %-100 %] 100 % (03/22 0900) Arterial Line BP: (96-252)/(53-121) 132/53 mmHg (03/22 0900) FiO2 (%):  [60 %-100 %] 60 % (03/22 0727) Weight:  [100.8 kg (222 lb 3.6 oz)] 100.8 kg (222 lb 3.6 oz) (03/22 0500) HEMODYNAMICS: CVP:  [9 mmHg-14 mmHg] 10 mmHg VENTILATOR SETTINGS: Vent Mode:  [-] PCV FiO2 (%):  [60 %-100 %] 60 % Set Rate:  [20 bmp] 20 bmp PEEP:  [10 cmH20] 10 cmH20 Plateau Pressure:  [13 cmH20-20 cmH20] 20 cmH20 INTAKE / OUTPUT:  Intake/Output Summary (Last 24 hours) at 11/04/14 1139 Last data filed at 11/04/14 0800  Gross per 24 hour  Intake  774.5 ml  Output    665 ml  Net  109.5 ml    PHYSICAL EXAMINATION: General:  Obese female in NAD on vent Neuro: RSS -4, sedated and paraluyzed HEENT:  [place]/AT, PERRL, no JVD noted Cardiovascular:  Irreg irreg, brady, no MRG Lungs:  Diffuse crackles, worse in bases.  Abdomen:  Soft, non-distended, + BS Musculoskeletal:  No acute deformity of ROM limitation Skin:  Grossly intact  LABS:  PULMONARY  Recent Labs Lab 11/03/14 1830 11/04/14 1045  PHART 7.239* 7.277*  PCO2ART 46.6*  47.6*  PO2ART 202.0* 144.0*  HCO3 19.4* 21.5  TCO2 20.9 23.0  O2SAT 98.6 98.3    CBC  Recent Labs Lab 11/02/14 1349 11/03/14 0441 11/04/14 0740  HGB 14.3 14.7 13.7  HCT 43.0 43.6 40.6  WBC 15.2* 12.3* 13.9*  PLT 126* 123* 154    COAGULATION  Recent Labs Lab 10/29/14 1650 11/02/14 1349 11/03/14 1820 11/04/14 0355  INR 0.96 1.08 1.17 1.15    CARDIAC    Recent Labs Lab 10/30/14 1820 10/31/14 0149 11/03/14 1820 11/04/14 11/04/14 0740  TROPONINI 0.07* 0.07* 0.08* 0.25* 0.19*   No results for input(s): PROBNP in  the last 168 hours.   CHEMISTRY  Recent Labs Lab 10/29/14 1650  11/03/14 0441 11/03/14 1820 11/04/14 11/04/14 0355 11/04/14 0740  NA 139  < > 140 141 141 140 142  K 3.1*  < > 4.0 3.6 3.9 4.2 4.0  CL 106  < > 108 109 113* 111 112  CO2 21  < > 18* 21 21 22 25   GLUCOSE 108*  < > 125* 242* 199* 181* 150*  BUN 17  < > 25* 23 22 19 19   CREATININE 1.03  < > 1.14* 1.49* 1.06 1.10 1.13*  CALCIUM 9.8  < > 9.4 8.1* 8.2* 8.2* 8.2*  MG 1.9  --   --   --   --   --  2.2  PHOS 4.0  --   --   --   --   --  4.7*  < > = values in this interval not displayed. Estimated Creatinine Clearance: 57.9 mL/min (by C-G formula based on Cr of 1.13).   LIVER  Recent Labs Lab 10/29/14 1650 10/30/14 0445 10/31/14 0555 11/01/14 0425 11/02/14 0831 11/02/14 1349 11/03/14 0441 11/03/14 1820 11/04/14 0355  AST  --  17 16 19 17   --  18  --   --   ALT  --  14 14 15 19   --  21  --   --   ALKPHOS  --  97 100 96 89  --  90  --   --   BILITOT  --  0.8 0.9 0.6 0.5  --  0.9  --   --   PROT  --  6.9 7.1 7.8 7.4  --  6.7  --   --   ALBUMIN  --  3.5 3.4* 3.6 3.5  --  3.6  --   --   INR 0.96  --   --   --   --  1.08  --  1.17 1.15     INFECTIOUS  Recent Labs Lab 11/04/14 0355  LATICACIDVEN 1.4     ENDOCRINE CBG (last 3)   Recent Labs  11/04/14 0401 11/04/14 0606 11/04/14 0741  GLUCAP 168* 152* 141*         IMAGING x48h Ct Head Wo Contrast  11/03/2014   CLINICAL DATA:  Status post cardiac arrest.  EXAM: CT HEAD WITHOUT CONTRAST  TECHNIQUE: Contiguous axial images were obtained from the base of the skull through the vertex without intravenous contrast.  COMPARISON:  None.  FINDINGS: The ventricles are normal in size and configuration. No extra-axial fluid collections are identified. The gray-white differentiation is normal. No CT findings for acute intracranial process such as hemorrhage or infarction. No mass lesions. The brainstem and cerebellum are grossly normal.  The bony structures are  intact. The paranasal sinuses and mastoid air cells are clear. The globes are intact.  IMPRESSION: No acute intracranial  findings or mass lesion.   Electronically Signed   By: Marijo Sanes M.D.   On: 11/03/2014 17:57   Ct Angio Chest Pe W/cm &/or Wo Cm  11/03/2014   CLINICAL DATA:  Cardiac arrest.  EXAM: CT ANGIOGRAPHY CHEST WITH CONTRAST  TECHNIQUE: Multidetector CT imaging of the chest was performed using the standard protocol during bolus administration of intravenous contrast. Multiplanar CT image reconstructions and MIPs were obtained to evaluate the vascular anatomy.  CONTRAST:  148mL OMNIPAQUE IOHEXOL 350 MG/ML SOLN  COMPARISON:  None.  FINDINGS: Chest wall: No significant findings.  Mediastinum: The heart is mildly enlarged but stable. No pericardial effusion. No mediastinal or hilar mass or adenopathy. The endotracheal tube is in good position. Small scattered lymph nodes are noted. The esophagus is grossly normal. The aorta is tortuous and demonstrates moderate calcifications but no focal aneurysm. There is reflux of contrast down the IVC and into the hepatic veins which could be due to right heart failure or tricuspid regurgitation.  The pulmonary arterial tree is fairly well opacified. No filling defects to suggest pulmonary emboli.  Lungs/ pleura: Small bilateral pleural effusions and moderate bibasilar atelectasis. No pulmonary edema.  Upper abdomen:  No significant findings.  Review of the MIP images confirms the above findings.  IMPRESSION: 1. No definite CT findings for pulmonary embolism. 2. Mild cardiac enlargement but no pericardial effusion. 3. Reflux of contrast down the IVC likely due to right heart failure or tricuspid regurgitation. 4. Small bilateral pleural effusions and bibasilar atelectasis or infiltrates.   Electronically Signed   By: Marijo Sanes M.D.   On: 11/03/2014 18:27   Dg Chest Port 1 View  11/04/2014   CLINICAL DATA:  Pulmonary embolism  EXAM: PORTABLE CHEST - 1 VIEW   COMPARISON:  11/03/2014  FINDINGS: Endotracheal tube tip is 5.4 cm above the carina. Right jugular central line extends into the SVC. Nasogastric tube extends into the stomach. There is continued mild worsening of basilar airspace opacities and small effusions.  IMPRESSION: Support equipment appears satisfactorily positioned.  Continued mild worsening of basilar airspace opacities and small effusions   Electronically Signed   By: Andreas Newport M.D.   On: 11/04/2014 05:02   Dg Chest Port 1 View  11/03/2014   CLINICAL DATA:  Central line placement.  Initial encounter.  EXAM: PORTABLE CHEST - 1 VIEW  COMPARISON:  Chest radiograph performed 11/02/2014, and CTA of the chest performed earlier today at 5:46 p.m.  FINDINGS: [name] endotracheal tube is seen ending 5 cm above the carina. A right IJ line is noted ending about the mid SVC. An enteric tube is noted extending below the diaphragm.  Vascular congestion is noted, with bibasilar and perihilar airspace opacification, likely reflecting pulmonary edema. Small bilateral pleural effusions are seen. No pneumothorax is identified.  The cardiomediastinal silhouette is mildly enlarged. No acute osseous abnormalities are identified.  IMPRESSION: 1. Right IJ line noted ending about the mid SVC. 2. Endotracheal tube seen ending 5 cm above the carina. 3. Vascular congestion and mild cardiomegaly, with bibasilar perihilar airspace opacification, likely reflecting pulmonary edema. This appears somewhat worsened from the prior chest radiograph. Small bilateral pleural effusions seen.   Electronically Signed   By: Garald Balding M.D.   On: 11/03/2014 19:45   Dg Abd Portable 1v  11/03/2014   CLINICAL DATA:  Nasogastric tube placement.  Initial encounter.  EXAM: PORTABLE ABDOMEN - 1 VIEW  COMPARISON:  CT of the abdomen and pelvis performed 02/12/2009  FINDINGS: [name] enteric tube is noted ending overlying the body of the stomach, with the side port noted about  the level of the patient's known small hiatal hernia.  The visualized bowel gas pattern is grossly unremarkable. The lung bases are better characterized on concurrent chest radiograph. No acute osseous abnormalities are identified.  IMPRESSION: Enteric tube noted ending overlying the body of the stomach, with the side port noted about the level of the patient's known small hiatal hernia.   Electronically Signed   By: Garald Balding M.D.   On: 11/03/2014 19:41       ASSESSMENT / PLAN:  PULMONARY OETT 3/21 >>> A: Acute respiratory failure s/p cardiac arrest  P:   Full vent support with PC CXR for tube placement Follow ABGs VAP bundle  CARDIOVASCULAR CVL 3/21 >>> A:  Cardiac arrest, etiology uncertain. CVA vs PE vs primary respiratory arrest, flash pulmonary edema? Cardiomyopathy LVEF 35-45% severe HK inferior, diffuse HK New Onset AF RVR Bradycardia HTN   - going to cath lab. On levophed 82mcg . Got brady with hypothermia  P:  Telemetry monitoring EKG MAP goal > 80 mm/Hg Levophed for MAP goal PRN hydralazine to keep SBP < 127mm/Hg CVP monitoring Repeat Echo 3/22 Rx therapeutic normothermia Holding amio in setting bradycardia Holding norvasc   RENAL A:   AKI   P:   Strict I&O Follow Bmet Replace electrolytes as indicated  GASTROINTESTINAL A:   No acute issues  P:   NPO PPI  HEMATOLOGIC A:   No acute issues  P:  Follow cbc Heparin gtt for AF per pharmacy  INFECTIOUS A:   ? CAP - ABX stopped by primary  P:   Monitor clinically  ENDOCRINE A:   No acute issues  P:   Follow glucose on chemistry Add SSI if consistently greater than 180.  NEUROLOGIC A:   Acute metabolic encephalopathy Consider PRES P:   RASS goal: -5 BIS goal < 50 Propofol, sublimaze infusions Nimbex per hypothermia protocol Monitor  FAMILY  - Updates: none family at bedside. D/w Dr Debara Pickett  - Inter-disciplinary family meet or Palliative Care meeting due by:   3/28   The patient is critically ill with multiple organ systems failure and requires high complexity decision making for assessment and support, frequent evaluation and titration of therapies, application of advanced monitoring technologies and extensive interpretation of multiple databases.   Critical Care Time devoted to patient care services described in this note is  30  Minutes. This time reflects time of care of this signee Dr Brand Males. This critical care time does not reflect procedure time, or teaching time or supervisory time of PA/NP/Med student/Med Resident etc but could involve care discussion time    Dr. Brand Males, M.D., [facility].C.P Pulmonary and Critical Care Medicine Staff Physician [facility] Pager: [phone], If no answer or between  15:00h - 7:00h: call [phone]  11/04/2014 11:39 AM

## 2014-11-04 NOTE — Procedures (Signed)
EEG report.  Brief clinical history: 69 y.o. female with a history of tobacco abuse and HTN who was admitted to [facility] for new onset atrial fibrillation and possible PNA. She had been on hypothermia protocol overnight markedly bradycardic and had to be rewarmed. She has had marked bradycardia.  Technique: this is a 17 channel routine scalp EEG performed at the bedside in the ICU, with bipolar and monopolar montages arranged in accordance to the international 10/20 system of electrode placement. One channel was dedicated to EKG recording.  Patient is unresponsive, sedated and intubated on the vent.  No activating procedures performed.  Description: as the study begins and throughout the entire recording, there is diffuse, constant, monomorphic 5 to 6 Hz activity that is rather no reactive and doesn't follow an ictal pattern at any time. No focal or generalized epileptiform discharges noted.  EKG showed infrequent premature atrial contractions. Impression: this is an abnormal EEG performed in the ICU setting, with findings consistent with a mild to moderate encephalopathy that in this particular scenario could have a post anoxic etiology. No electrographic seizures noted. Clinical correlation is advised.   Dorian Pod, MD

## 2014-11-04 NOTE — Consult Note (Signed)
[address]       [place] [address]             [phone]          CARDIOTHORACIC SURGERY CONSULTATION REPORT  PCP is [name], MD Referring Provider is Troy Sine, MD  Reason for consultation:  Left Main Disease  HPI:  Patient is a 69 year old obese African-American female with no previous cardiac history and risk factors notable primarily for history of hypertension and tobacco use who was admitted to the hospital 10/29/2014 with a several week history of progressive symptoms of symptoms suggestive of upper respiratory tract infection, palpitations, worsening shortness of breath, mild sub-sternal chest tightness and fatigue. The patient initially went to her primary care physician's office where she was noted to be in atrial fibrillation with rapid ventricular response. She was sent to the emergency department where she was noted to be in atrial fibrillation with heart rate in the 062I and systolic blood pressure ranging between 120 and 1 80 mmHg. Initial troponin was negative.  Chest x-ray was felt to be concerning for possible left lower lobe pneumonia.  [name] atrial fibrillation was treated with diltiazem for rate control and the patient was started on antibiotics for possible community-acquired pneumonia.  A transthoracic echocardiogram was performed demonstrating moderate left ventricular systolic dysfunction with ejection fraction estimated 35-40%, diffuse global hypokinesis, and severe hypokinesis of the inferior wall. There was also reportedly severe left atrial enlargement and moderate mitral regurgitation.  Serial troponin levels were flat and very mildly elevated, measuring 0.07 - 0.07 - 0.07.  Diagnostic cardiac catheterization was recommended on 10/31/2014 but the patient became very anxious, hypertensive and short of breath and subsequently refused to consent for catheterization.  Although she never received any IV contrast, following this  event the patient apparently reported a history of possible IV contrast allergy.  After further consultation the patient eventually agreed to consent to catheterization, and she was brought to the Cath Lab on 11/03/2014. However, prior to undergoing catheterization the patient again developed sudden onset severe agitation and combativeness associated with shortness of breath.  She then suffered a sudden cardiac arrest associated with asystole and without any associated preceding signs of chest pain, chest tightness, VT, VF, or other arrhythmia.  She was promptly resuscitated by the cath lab team and code team, and her code time was only 9 minute total duration.  By report she was noted to have signs of decerebrate posturing immediately following the code.  She was evaluated by the pulmonary critical care team and started on high-dose sedation for cooling protocol.  She remained in atrial fibrillation and developed some bradycardia during cooling.  A decision was made to take the patient to cardiac catheterization earlier today by Dr. Claiborne Billings. Catheterization demonstrates 80% distal left main coronary artery stenosis With moderate diffuse left ventricular systolic dysfunction, ejection fraction estimated 35-40%. Right heart catheterization was not performed.  Cardiothoracic surgical consultation was requested.  The patient is currently heavily sedated and paralyzed on the ventilator. By report she has demonstrated no signs of neurologic recovery since her cardiac arrest, although sedation has not yet been withdrawn.  Past Medical History  Diagnosis Date  . Hypertension   . Asthma   . Arthritis     History reviewed. No pertinent past surgical history.  Family History  Problem Relation Age of Onset  . Cancer Mother   . Heart disease Father     History  Social History  . Marital Status: Widowed    Spouse Name: N/A  . Number of Children: N/A  . Years of Education: N/A   Occupational History  .  Not on file.   Social History Main Topics  . Smoking status: Current Some Day Smoker -- 1.00 packs/day for 32 years    Types: Cigarettes  . Smokeless tobacco: Not on file  . Alcohol Use: No  . Drug Use: No  . Sexual Activity: Not on file   Other Topics Concern  . Not on file   Social History Narrative    Prior to Admission medications   Medication Sig Start Date End Date Taking? Authorizing Provider  hydrochlorothiazide (HYDRODIURIL) 12.5 MG tablet Take 12.5 mg by mouth daily.   Yes Historical Provider, MD  ibuprofen (ADVIL,MOTRIN) 200 MG tablet Take 200 mg by mouth every 4 (four) hours as needed for pain.   Yes Historical Provider, MD  metoprolol succinate (TOPROL-XL) 25 MG 24 hr tablet Take 1 tablet (25 mg total) by mouth daily. Please only take HCTZ 12.5 mg and not the 25 mg if you are going back to metoprolol 10/10/14  Yes Thao P Le, DO  potassium chloride 40 MEQ/15ML (20%) LIQD Take 13.3 mEq by mouth every other day.    Yes Historical Provider, MD  amoxicillin (AMOXIL) 500 MG capsule Take 1 capsule (500 mg total) by mouth 2 (two) times daily. For sinus infection 10/09/14   Thao P Le, DO  chlorthalidone (HYGROTON) 25 MG tablet Take 1 tablet (25 mg total) by mouth daily. 10/09/14   Thao P Le, DO  meloxicam (MOBIC) 7.5 MG tablet Take 1 tablet (7.5 mg total) by mouth daily. 10/09/14   Thao P Le, DO    Current Facility-Administered Medications  Medication Dose Route Frequency Provider Last Rate Last Dose  . 0.9 %  sodium chloride infusion   Intravenous Continuous Troy Sine, MD 50 mL/hr at 11/04/14 1307 50 mL/hr at 11/04/14 1307  . acetaminophen (TYLENOL) tablet 650 mg  650 mg Oral Q4H PRN Troy Sine, MD      . antiseptic oral rinse (CPC / CETYLPYRIDINIUM CHLORIDE 0.05%) solution 7 mL  7 mL Mouth Rinse QID Corey Harold, NP   7 mL at 11/04/14 1600  . artificial tears (LACRILUBE) ophthalmic ointment 1 application  1 application Both Eyes 3 times per day Corey Harold, NP   1  application at 08/65/78 1600  . aspirin EC tablet 81 mg  81 mg Oral Daily Troy Sine, MD   0 mg at 11/04/14 1315  . atorvastatin (LIPITOR) tablet 80 mg  80 mg Oral q1800 Troy Sine, MD   80 mg at 11/04/14 1808  . chlorhexidine (PERIDEX) 0.12 % solution 15 mL  15 mL Mouth Rinse BID Corey Harold, NP   15 mL at 11/04/14 0752  . cisatracurium (NIMBEX) bolus via infusion 9.7 mg  0.1 mg/kg Intravenous Once Corey Harold, NP       And  . cisatracurium (NIMBEX) 200 mg in sodium chloride 0.9 % 200 mL (1 mg/mL) infusion  1-1.5 mcg/kg/min Intravenous Continuous Corey Harold, NP 8.8 mL/hr at 11/04/14 1500 1.5 mcg/kg/min at 11/04/14 1500  . docusate sodium (COLACE) capsule 100 mg  100 mg Oral Daily Verlee Monte, MD   100 mg at 11/03/14 0950  . fentaNYL (SUBLIMAZE) 2,500 mcg in sodium chloride 0.9 % 250 mL (10 mcg/mL) infusion  25-400 mcg/hr Intravenous Continuous Corey Harold, NP  20 mL/hr at 11/04/14 1022 200 mcg/hr at 11/04/14 1022  . fentaNYL (SUBLIMAZE) bolus via infusion 25 mcg  25 mcg Intravenous Q30 min PRN Corey Harold, NP   25 mcg at 11/03/14 2006  . heparin ADULT infusion 100 units/mL (25000 units/250 mL)  1,200 Units/hr Intravenous Continuous Lyndee Leo, RPH      . hydrALAZINE (APRESOLINE) injection 10-40 mg  10-40 mg Intravenous Q4H PRN Corey Harold, NP   20 mg at 11/03/14 1840  . insulin aspart (novoLOG) injection 0-15 Units  0-15 Units Subcutaneous 6 times per day Juanito Doom, MD   2 Units at 11/04/14 0751  . midazolam (VERSED) 50 mg in sodium chloride 0.9 % 50 mL (1 mg/mL) infusion  1-10 mg/hr Intravenous Continuous Juanito Doom, MD 4 mL/hr at 11/04/14 1500 4 mg/hr at 11/04/14 1500  . norepinephrine (LEVOPHED) 4 mg in dextrose 5 % 250 mL (0.016 mg/mL) infusion  0-40 mcg/min Intravenous Continuous Verlee Monte, MD   Stopped at 11/04/14 1500  . ondansetron (ZOFRAN) injection 4 mg  4 mg Intravenous Q6H PRN Troy Sine, MD      . pantoprazole (PROTONIX) injection 40  mg  40 mg Intravenous Q24H Corey Harold, NP   40 mg at 11/04/14 1808    Allergies  Allergen Reactions  . Bee Venom Anaphylaxis  . Shrimp [Shellfish Allergy] Swelling      Review of Systems:   Unable to obtain ROS - patient currently heavily sedated and paralyzed on vent     Physical Exam:   BP 101/59 mmHg  Pulse 57  Temp(Src) 97.9 F (36.6 C) (Oral)  Resp 20  Ht 5' 7.5" (1.715 m)  Wt 100.8 kg (222 lb 3.6 oz)  BMI 34.27 kg/m2  SpO2 98%  General:  Obese female heavily sedated and paralyzed on vent  HEENT:  Unremarkable   Neck:   no JVD, no bruits, no adenopathy   Chest:   clear to auscultation, symmetrical breath sounds, no wheezes, no rhonchi   CV:   RRR, no murmur   Abdomen:  soft, non-tender, no masses   Extremities:  warm, well-perfused, pulses diminished, no lower extremity edema  Rectal/GU  Deferred  Neuro:   Unresponsive and paralyzed on vent  Skin:   Clean and dry, no rashes, no breakdown  Diagnostic Tests:  Transthoracic Echocardiography  Patient:  Tariyah, Pendry MR #:    [id number] Study Date: 10/30/2014 Gender:   F Age:    64 Height:   171.5 cm Weight:   97.7 kg BSA:    2.19 m^2 Pt. Status: Room:    3W16C  SONOGRAPHER Watkins ORDERING   Deneise Lever REFERRING  [place], [place], [place], Inpatient  cc:  ------------------------------------------------------------------- LV EF: 35% -  40%  ------------------------------------------------------------------- Indications:   Atrial fibrillation - 427.31.  ------------------------------------------------------------------- History:  PMH: Pneumonia. Risk factors: Hypertension.  ------------------------------------------------------------------- Study Conclusions  - Left ventricle: The cavity size was normal. Wall thickness was increased in a pattern of mild LVH.  Systolic function was moderately reduced. The estimated ejection fraction was in the range of 35% to 40%. Diffuse hypokinesis. There is severe hypokinesis of the inferolateral myocardium. Doppler parameters are consistent with restrictive physiology, indicative of decreased left ventricular diastolic compliance and/or increased left atrial pressure. Doppler parameters are consistent with high ventricular filling pressure. - Aortic valve: There was mild regurgitation. - Mitral valve: Calcified annulus.  There was moderate regurgitation. - Left atrium: The atrium was severely dilated. - Pulmonary arteries: Systolic pressure was moderately increased. PA peak pressure: 55 mm Hg (S).  Impressions:  - Inferolateral hypokinesis with overall moderate reduction in LV function; restrictive filling; severe LAE; moderate MR; mild AI; moderately elevated pulmonary pressure.  Transthoracic echocardiography. M-mode, complete 2D, spectral Doppler, and color Doppler. Birthdate: Patient birthdate: 05-23-1946. Age: Patient is 69 yr old. Sex: Gender: female. BMI: 33.2 kg/m^2. Blood pressure:   134/71 Patient status: Inpatient. Study date: Study date: 10/30/2014. Study time: 10:10 AM. Location: Echo laboratory.  -------------------------------------------------------------------  ------------------------------------------------------------------- Left ventricle: The cavity size was normal. Wall thickness was increased in a pattern of mild LVH. Systolic function was moderately reduced. The estimated ejection fraction was in the range of 35% to 40%. Diffuse hypokinesis. Regional wall motion abnormalities:  There is severe hypokinesis of the inferolateral myocardium. Doppler parameters are consistent with restrictive physiology, indicative of decreased left ventricular diastolic compliance and/or increased left atrial pressure. Doppler parameters are consistent  with high ventricular filling pressure.  ------------------------------------------------------------------- Aortic valve:  Trileaflet; mildly thickened leaflets. Mobility was not restricted. Doppler: Transvalvular velocity was within the normal range. There was no stenosis. There was mild regurgitation.  ------------------------------------------------------------------- Aorta: Aortic root: The aortic root was normal in size.  ------------------------------------------------------------------- Mitral valve:  Calcified annulus. Mobility was not restricted. Doppler: Transvalvular velocity was within the normal range. There was no evidence for stenosis. There was moderate regurgitation. Peak gradient (D): 5 mm Hg.  ------------------------------------------------------------------- Left atrium: The atrium was severely dilated.  ------------------------------------------------------------------- Right ventricle: The cavity size was normal. Systolic function was normal.  ------------------------------------------------------------------- Pulmonic valve:  Doppler: Transvalvular velocity was within the normal range. There was no evidence for stenosis.  ------------------------------------------------------------------- Tricuspid valve:  Structurally normal valve.  Doppler: Transvalvular velocity was within the normal range. There was mild regurgitation.  ------------------------------------------------------------------- Pulmonary artery:  Systolic pressure was moderately increased.  ------------------------------------------------------------------- Right atrium: The atrium was normal in size.  ------------------------------------------------------------------- Pericardium: There was no pericardial effusion.  ------------------------------------------------------------------- Systemic veins: Inferior vena cava: The vessel was normal in  size.  ------------------------------------------------------------------- Measurements  Left ventricle               Value    Reference LV ID, ED, PLAX chordal           48.7 mm   43 - 52 LV ID, ES, PLAX chordal       (H)   40.7 mm   23 - 38 LV fx shortening, PLAX chordal   (L)   16  %   >=29 LV PW thickness, ED             15  mm   --------- IVS/LV PW ratio, ED             0.99     <=1.3 LV e&', lateral               11  cm/s  --------- LV E/e&', lateral              10.18    --------- LV e&', medial                4.43 cm/s  --------- LV E/e&', medial               25.28    --------- LV e&', average               7.72 cm/s  --------- LV E/e&', average  14.52    ---------  Ventricular septum             Value    Reference IVS thickness, ED              14.8 mm   ---------  Aortic valve                Value    Reference Aortic regurg peak velocity         266  cm/s  --------- Aortic regurg pressure half-time      828  ms   --------- Aortic regurg peak gradient         28  mm Hg ---------  Aorta                    Value    Reference Aortic root ID, ED             28  mm   ---------  Left atrium                 Value    Reference LA ID, A-P, ES               35  mm   --------- LA ID/bsa, A-P               1.6  cm/m^2 <=2.2 LA volume, S                99  ml   --------- LA volume/bsa, S              45.2 ml/m^2 --------- LA volume, ES, 1-p A4C           111  ml   --------- LA volume/bsa, ES, 1-p A4C         50.6 ml/m^2  --------- LA volume, ES, 1-p A2C           83  ml   --------- LA volume/bsa, ES, 1-p A2C         37.9 ml/m^2 ---------  Mitral valve                Value    Reference Mitral E-wave peak velocity         112  cm/s  --------- Mitral A-wave peak velocity         45.4 cm/s  --------- Mitral deceleration time          165  ms   150 - 230 Mitral peak gradient, D           5   mm Hg --------- Mitral E/A ratio, peak           2.7     --------- Aliasing velocity, MR PISA         38.5 cm/s  --------- Mitral regurg PISA radius          7   mm   --------- Mitral maximal regurg velocity,       518  cm/s  --------- PISA Mitral regurg VTI, PISA           165  cm   --------- Mitral ERO, PISA              0.23 cm^2  --------- Mitral regurg volume, PISA         38  ml   ---------  Pulmonary arteries             Value    Reference PA  pressure, S, DP         (H)   55  mm Hg <=30  Tricuspid valve               Value    Reference Tricuspid regurg peak velocity       359  cm/s  --------- Tricuspid peak RV-RA gradient        52  mm Hg ---------  Systemic veins               Value    Reference Estimated CVP                3   mm Hg ---------  Right ventricle               Value    Reference RV pressure, S, DP         (H)   55  mm Hg <=30 RV s&', lateral, S              13.7 cm/s  ---------  Legend: (L) and (H) mark values outside specified reference range.  ------------------------------------------------------------------- Prepared and Electronically Authenticated by  Kirk Ruths 2016-03-17T13:55:37    CARDIAC CATHETERIZATION      HISTORY:   MCCAYLA SHIMADA is a 69 y.o. female who is brought to the laboratory today intubated, paralyzed, and sedated from the cardiac care unit. She had been on hypothermia protocol overnight markedly bradycardic and had to be rewarmed. She has had marked bradycardia. She had had mildly positive troponin and had developed new onset atrial fibrillation. She suffered a cardiac arrest yesterday and her planned catheterization was aborted. She presents to the Cath Lab today for cardiac catheterization and temporary pacemaker insertion.   PROCEDURE: Transvenous temporary pacemaker insertion; Left heart catheterization: coronary angiography, selective angiography into the left subclavian with visualization of the LIMA vessel, left ventriculography.  The patient was brought to the [facility] cardiac catherization laboratory intubated, sedated, and paralyzed on a ventilator. Her right groin was prepped and shaved in usual sterile fashion. Xylocaine 1% was used for local anesthesia. A 6 French sheath was inserted into the R femoral artery. A 6 French sheath was inserted into the right femoral vein. A temporary pacemaker was inserted and advanced to the RV apex with excellent capture threshold. Diagnostic catheterizatiion was done with 5 [place] FL5, FR4, and pigtail catheters. Left ventriculography was done with 28 cc Omnipaque contrast. Hemostasis of the left femoral artery was obtained by direct manual compression. The temporary pacemaker was sutured in place. The patient tolerated the procedure well.   HEMODYNAMICS:  Central Aorta: 130/72  Left Ventricle: 130/14/25  ANGIOGRAPHY:  Left main: Large vessel which trifurcated into a large LAD, a ramus intermediate vessel, and large left circumflex artery artery. There was focal 80% distal left main stenosis.  LAD: Large caliber vessel which gave rise to 2 diagonal branches. Several septal perforating arteries and extended to and  wrapped around the LV apex. The LAD was free of significant disease  Ramus Intermediate: Small moderate size vessel that was normal.  Left circumflex: Large vessel which gave rise to one major marginal branch. There was 80% stenosis in the AV groove circumflex proximal to the takeoff of this marginal vessel.   Right coronary artery: Large angiographically normal vessel which supplied the PDA and small posterolateral vessel  Selective angiography into the left subclavian revealed no evidence for stenosis and a widely patent unbypassed LIMA vessel, which will be used for CABG  surgery.  Left ventriculography revealed diffuse hypocontractility with an ejection fraction of 35-40%.   IMPRESSION:  Moderate LV dysfunction secondary to ischemic cardiomyopathy with an ejection fraction of 35-40%.  Significant coronary obstructive disease with 80% focal distal left main stenosis and 80% stenosis in the AV groove circumflex vessel proximal to the OM1 takeoff; normal LAD and RCA vessels.  Insertion of temporary pacemaker for marked bradycardia with initial heart rates in the upper 30s.  RECOMMENDATION:  Surgical consultation for CABG revascularization surgery.    Troy Sine, MD, [facility] 11/04/2014 12:16 PM     Impression:  I have personally reviewed the patient's chart, transthoracic echocardiogram, and diagnostic cardiac catheterization.  She originally presented with new onset atrial fibrillation associated with shortness of breath and mild chest discomfort with weakly positive troponin levels. Transthoracic echocardiogram performed prior to her cardiac arrest revealed moderate global left ventricular systolic dysfunction with severe inferior wall hypokinesis and moderate mitral regurgitation with severe left atrial enlargement.  She suffered a sudden asystolic arrest yesterday of unclear etiology and significance.  According to our eyewitnesses accounts of the patient's cardiac arrest,  she did not have associated signs or symptoms to suggest ongoing myocardial ischemia.  Diagnostic cardiac catheterization performed earlier today demonstrates significant stenosis of the distal left main coronary artery, probably 75-80%.  There is also 70-80% stenosis of the mid left circumflex coronary artery. There is otherwise minimal nonobstructive coronary artery disease.  At some point the optimal treatment of the patient's coronary artery disease might include surgical revascularization.  However, at present the patient remains heavily sedated on ventilator support in multisystem organ failure.  She is not a candidate for coronary artery bypass grafting under the present circumstances.     Recommendations:  I recommend continued medical therapy for treatment of the patient's coronary artery disease and atrial fibrillation while she is allowed to recover from yesterday's events.  Once she has been successfully extubated from the ventilator and allowed to recover to her previous physical and mental baseline we can discuss treatment options further. I discussed the patient's current condition and the results of her cardiac catheterization at length with the patient's daughter over the telephone.  All questions answered.  We will continue to follow periodically while the patient remains in the hospital, but please call if specific questions arise.    I spent in excess of 120 minutes during the conduct of this hospital consultation and >50% of this time involved direct face-to-face encounter for counseling and/or coordination of the patient's care.   Valentina Gu. Roxy Manns, MD 11/04/2014 6:21 PM

## 2014-11-04 NOTE — Progress Notes (Signed)
EEG completed, results pending. 

## 2014-11-04 NOTE — Progress Notes (Signed)
Called MD Freidman to inform that pts HR is high 30's to low 40's. MD Luiz Ochoa to consult with CCM regarding continuation of 33 Hypothermia temperature management.

## 2014-11-04 NOTE — Progress Notes (Signed)
Called [facility] MD. Pt blood glucose 130's-160's. MD gave sliding scale orders. Also notified MD that pt's K is 3.7 at 2200. MD ordered 3 runs of IV K. Also notified MD that pt's HR is high 40's and low 50's. MD ordered to stop propofol drip and start versed. MD also notified that target temperature goal was not met within 2 hours. No additional orders received. Will continue to monitor pt closely.

## 2014-11-04 NOTE — Progress Notes (Signed)
[facility] Progress Note Patient Name: [name] [name] DOB: 10-Apr-1946 MRN: [id number]   Date of Service  11/04/2014  HPI/Events of Note  k low normothermia  eICU Interventions  replace     Intervention Category Major Interventions: Electrolyte abnormality - evaluation and management  Raylene Miyamoto. 11/04/2014, 6:38 PM

## 2014-11-04 NOTE — Progress Notes (Signed)
DAILY PROGRESS NOTE  Subjective:  Intubated, paralyzed and sedated. Was on cold hypothermia protocol overnight, however, became markedly bradycardic and had to be re-warmed. Events yesterday and over the past week are noted.   Objective:  Temp:  [91.2 F (32.9 C)-98.4 F (36.9 C)] 98.2 F (36.8 C) (03/22 0800) Pulse Rate:  [30-120] 37 (03/22 0800) Resp:  [8-22] 20 (03/22 0400) BP: (91-175)/(39-145) 91/39 mmHg (03/22 0800) SpO2:  [95 %-100 %] 99 % (03/22 0800) Arterial Line BP: (96-252)/(53-121) 129/53 mmHg (03/22 0800) FiO2 (%):  [60 %-100 %] 60 % (03/22 0727) Weight:  [222 lb 3.6 oz (100.8 kg)] 222 lb 3.6 oz (100.8 kg) (03/22 0500) Weight change: 7 lb 11.6 oz (3.503 kg)  Intake/Output from previous day: 03/21 0701 - 03/22 0700 In: 686.7 [I.V.:686.7] Out: 975 [Urine:925; Emesis/NG output:50]  Intake/Output from this shift: Total I/O In: 87.8 [I.V.:87.8] Out: 15 [Urine:15]  Medications: Current Facility-Administered Medications  Medication Dose Route Frequency Provider Last Rate Last Dose  . acetaminophen (TYLENOL) tablet 650 mg  650 mg Oral Q4H PRN Deneise Lever, MD   650 mg at 11/02/14 1808  . amiodarone (PACERONE) tablet 200 mg  200 mg Oral BID Sueanne Margarita, MD   200 mg at 11/03/14 0951  . antiseptic oral rinse (CPC / CETYLPYRIDINIUM CHLORIDE 0.05%) solution 7 mL  7 mL Mouth Rinse QID Corey Harold, NP   7 mL at 11/04/14 0400  . artificial tears (LACRILUBE) ophthalmic ointment 1 application  1 application Both Eyes 3 times per day Corey Harold, NP   1 application at 83/15/17 [id number]  . aspirin EC tablet 81 mg  81 mg Oral Daily Deneise Lever, MD   81 mg at 11/02/14 1020  . carvedilol (COREG) tablet 3.125 mg  3.125 mg Oral BID WC Sueanne Margarita, MD   3.125 mg at 11/03/14 0950  . chlorhexidine (PERIDEX) 0.12 % solution 15 mL  15 mL Mouth Rinse BID Corey Harold, NP   15 mL at 11/04/14 0752  . cisatracurium (NIMBEX) bolus via infusion 9.7 mg  0.1 mg/kg Intravenous  Once Corey Harold, NP       And  . cisatracurium (NIMBEX) 200 mg in sodium chloride 0.9 % 200 mL (1 mg/mL) infusion  1-1.5 mcg/kg/min Intravenous Continuous Corey Harold, NP 7 mL/hr at 11/03/14 2210 1.2 mcg/kg/min at 11/03/14 2210  . docusate sodium (COLACE) capsule 100 mg  100 mg Oral Daily Verlee Monte, MD   100 mg at 11/03/14 0950  . fentaNYL (SUBLIMAZE) 2,500 mcg in sodium chloride 0.9 % 250 mL (10 mcg/mL) infusion  25-400 mcg/hr Intravenous Continuous Corey Harold, NP 20 mL/hr at 11/03/14 2218 200 mcg/hr at 11/03/14 2218  . fentaNYL (SUBLIMAZE) bolus via infusion 25 mcg  25 mcg Intravenous Q30 min PRN Corey Harold, NP   25 mcg at 11/03/14 2006  . heparin ADULT infusion 100 units/mL (25000 units/250 mL)  1,000 Units/hr Intravenous Continuous Erenest Blank, RPH 10 mL/hr at 11/04/14 0454 1,000 Units/hr at 11/04/14 0454  . hydrALAZINE (APRESOLINE) injection 10-40 mg  10-40 mg Intravenous Q4H PRN Corey Harold, NP   20 mg at 11/03/14 1840  . insulin aspart (novoLOG) injection 0-15 Units  0-15 Units Subcutaneous 6 times per day Juanito Doom, MD   2 Units at 11/04/14 0751  . midazolam (VERSED) 50 mg in sodium chloride 0.9 % 50 mL (1 mg/mL) infusion  1-10 mg/hr Intravenous Continuous Juanito Doom,  MD 2 mL/hr at 11/03/14 2255 2 mg/hr at 11/03/14 2255  . norepinephrine (LEVOPHED) 4 mg in dextrose 5 % 250 mL (0.016 mg/mL) infusion  0-40 mcg/min Intravenous Continuous Verlee Monte, MD 48.8 mL/hr at 11/04/14 0609 13 mcg/min at 11/04/14 0609  . ondansetron (ZOFRAN) injection 4 mg  4 mg Intravenous Q6H PRN Deneise Lever, MD   4 mg at 10/31/14 1454  . pantoprazole (PROTONIX) injection 40 mg  40 mg Intravenous Q24H Corey Harold, NP   40 mg at 11/03/14 2218    Physical Exam: General appearance: intubed, sedated and paralyzed on vent Neck: no carotid bruit and no JVD Lungs: clear to auscultation bilaterally Heart: bradycardia with occasional ectopy Abdomen: soft, no  masses Extremities: extremities normal, atraumatic, no cyanosis or edema Pulses: 2+ and symmetric Skin: Skin color, texture, turgor normal. No rashes or lesions Neurologic: Mental status: sedated and paralyzed Psych: Cannot assess  Lab Results: Results for orders placed or performed during the hospital encounter of 10/29/14 (from the past 48 hour(s))  Basic metabolic panel     Status: Abnormal   Collection Time: 11/02/14  1:49 PM  Result Value Ref Range   Sodium 140 135 - 145 mmol/L   Potassium 3.2 (L) 3.5 - 5.1 mmol/L   Chloride 106 96 - 112 mmol/L   CO2 23 19 - 32 mmol/L   Glucose, Bld 126 (H) 70 - 99 mg/dL   BUN 28 (H) 6 - 23 mg/dL   Creatinine, Ser 1.24 (H) 0.50 - 1.10 mg/dL   Calcium 9.2 8.4 - 10.5 mg/dL   GFR calc non Af Amer 43 (L) >90 mL/min   GFR calc Af Amer 50 (L) >90 mL/min    Comment: (NOTE) The eGFR has been calculated using the CKD EPI equation. This calculation has not been validated in all clinical situations. eGFR's persistently <90 mL/min signify possible Chronic Kidney Disease.    Anion gap 11 5 - 15  Protime-INR     Status: None   Collection Time: 11/02/14  1:49 PM  Result Value Ref Range   Prothrombin Time 14.1 11.6 - 15.2 seconds   INR 1.08 0.00 - 1.49  CBC     Status: Abnormal   Collection Time: 11/02/14  1:49 PM  Result Value Ref Range   WBC 15.2 (H) 4.0 - 10.5 K/uL   RBC 4.85 3.87 - 5.11 MIL/uL   Hemoglobin 14.3 12.0 - 15.0 g/dL   HCT 43.0 36.0 - 46.0 %   MCV 88.7 78.0 - 100.0 fL   MCH 29.5 26.0 - 34.0 pg   MCHC 33.3 30.0 - 36.0 g/dL   RDW 15.9 (H) 11.5 - 15.5 %   Platelets 126 (L) 150 - 400 K/uL  Platelet inhibition p2y12 (if on daily thienopyridine)     Status: None   Collection Time: 11/02/14  1:49 PM  Result Value Ref Range   Platelet Function  P2Y12 222 194 - 418 PRU  Comprehensive metabolic panel     Status: Abnormal   Collection Time: 11/03/14  4:41 AM  Result Value Ref Range   Sodium 140 135 - 145 mmol/L   Potassium 4.0 3.5 - 5.1  mmol/L    Comment: DELTA CHECK NOTED   Chloride 108 96 - 112 mmol/L   CO2 18 (L) 19 - 32 mmol/L   Glucose, Bld 125 (H) 70 - 99 mg/dL   BUN 25 (H) 6 - 23 mg/dL   Creatinine, Ser 1.14 (H) 0.50 - 1.10 mg/dL  Calcium 9.4 8.4 - 10.5 mg/dL   Total Protein 6.7 6.0 - 8.3 g/dL   Albumin 3.6 3.5 - 5.2 g/dL   AST 18 0 - 37 U/L   ALT 21 0 - 35 U/L   Alkaline Phosphatase 90 39 - 117 U/L   Total Bilirubin 0.9 0.3 - 1.2 mg/dL   GFR calc non Af Amer 48 (L) >90 mL/min   GFR calc Af Amer 56 (L) >90 mL/min    Comment: (NOTE) The eGFR has been calculated using the CKD EPI equation. This calculation has not been validated in all clinical situations. eGFR's persistently <90 mL/min signify possible Chronic Kidney Disease.    Anion gap 14 5 - 15  CBC     Status: Abnormal   Collection Time: 11/03/14  4:41 AM  Result Value Ref Range   WBC 12.3 (H) 4.0 - 10.5 K/uL   RBC 5.01 3.87 - 5.11 MIL/uL   Hemoglobin 14.7 12.0 - 15.0 g/dL   HCT 43.6 36.0 - 46.0 %   MCV 87.0 78.0 - 100.0 fL   MCH 29.3 26.0 - 34.0 pg   MCHC 33.7 30.0 - 36.0 g/dL   RDW 15.8 (H) 11.5 - 15.5 %   Platelets 123 (L) 150 - 400 K/uL  Heparin level (unfractionated)     Status: Abnormal   Collection Time: 11/03/14  4:41 AM  Result Value Ref Range   Heparin Unfractionated 0.29 (L) 0.30 - 0.70 IU/mL    Comment:        IF HEPARIN RESULTS ARE BELOW EXPECTED VALUES, AND PATIENT DOSAGE HAS BEEN CONFIRMED, SUGGEST FOLLOW UP TESTING OF ANTITHROMBIN III LEVELS.   MRSA PCR Screening     Status: None   Collection Time: 11/03/14  6:08 PM  Result Value Ref Range   MRSA by PCR NEGATIVE NEGATIVE    Comment:        The GeneXpert MRSA Assay (FDA approved for NASAL specimens only), is one component of a comprehensive MRSA colonization surveillance program. It is not intended to diagnose MRSA infection nor to guide or monitor treatment for MRSA infections.   Troponin I     Status: Abnormal   Collection Time: 11/03/14  6:20 PM  Result  Value Ref Range   Troponin I 0.08 (H) <0.031 ng/mL    Comment:        PERSISTENTLY INCREASED TROPONIN VALUES IN THE RANGE OF 0.04-0.49 ng/mL CAN BE SEEN IN:       -UNSTABLE ANGINA       -CONGESTIVE HEART FAILURE       -MYOCARDITIS       -CHEST TRAUMA       -ARRYHTHMIAS       -LATE PRESENTING MYOCARDIAL INFARCTION       -COPD   CLINICAL FOLLOW-UP RECOMMENDED.   Basic metabolic panel     Status: Abnormal   Collection Time: 11/03/14  6:20 PM  Result Value Ref Range   Sodium 141 135 - 145 mmol/L   Potassium 3.6 3.5 - 5.1 mmol/L   Chloride 109 96 - 112 mmol/L   CO2 21 19 - 32 mmol/L   Glucose, Bld 242 (H) 70 - 99 mg/dL   BUN 23 6 - 23 mg/dL   Creatinine, Ser 1.49 (H) 0.50 - 1.10 mg/dL   Calcium 8.1 (L) 8.4 - 10.5 mg/dL   GFR calc non Af Amer 35 (L) >90 mL/min   GFR calc Af Amer 40 (L) >90 mL/min    Comment: (NOTE) The eGFR  has been calculated using the CKD EPI equation. This calculation has not been validated in all clinical situations. eGFR's persistently <90 mL/min signify possible Chronic Kidney Disease.    Anion gap 11 5 - 15  Protime-INR now and repeat in 8 hours     Status: None   Collection Time: 11/03/14  6:20 PM  Result Value Ref Range   Prothrombin Time 15.1 11.6 - 15.2 seconds   INR 1.17 0.00 - 1.49  APTT now and repeat in 8 hours     Status: None   Collection Time: 11/03/14  6:20 PM  Result Value Ref Range   aPTT 26 24 - 37 seconds  Blood gas, arterial     Status: Abnormal   Collection Time: 11/03/14  6:30 PM  Result Value Ref Range   FIO2 1.00 %   Delivery systems VENTILATOR    Mode PRESSURE CONTROL    Rate 20 resp/min   Peep/cpap 10.0 cm H20   Pressure control 20.0 cm H20   pH, Arterial 7.239 (L) 7.350 - 7.450   pCO2 arterial 46.6 (H) 35.0 - 45.0 mmHg   pO2, Arterial 202.0 (H) 80.0 - 100.0 mmHg   Bicarbonate 19.4 (L) 20.0 - 24.0 mEq/L   TCO2 20.9 0 - 100 mmol/L   Acid-base deficit 6.9 (H) 0.0 - 2.0 mmol/L   O2 Saturation 98.6 %   Patient  temperature 97.5    Drawn by COLLECTED BY NURSE    Sample type ARTERIAL DRAW    Allens test (pass/fail) PASS PASS  Glucose, capillary     Status: Abnormal   Collection Time: 11/03/14  7:53 PM  Result Value Ref Range   Glucose-Capillary 169 (H) 70 - 99 mg/dL  Glucose, capillary     Status: Abnormal   Collection Time: 11/03/14  9:00 PM  Result Value Ref Range   Glucose-Capillary 139 (H) 70 - 99 mg/dL   Comment 1 Arterial Specimen   Glucose, capillary     Status: Abnormal   Collection Time: 11/03/14 10:00 PM  Result Value Ref Range   Glucose-Capillary 163 (H) 70 - 99 mg/dL   Comment 1 Arterial Specimen   Glucose, capillary     Status: Abnormal   Collection Time: 11/03/14 11:30 PM  Result Value Ref Range   Glucose-Capillary 176 (H) 70 - 99 mg/dL   Comment 1 Arterial Specimen   Troponin I     Status: Abnormal   Collection Time: 11/04/14 12:00 AM  Result Value Ref Range   Troponin I 0.25 (H) <0.031 ng/mL    Comment:        PERSISTENTLY INCREASED TROPONIN VALUES IN THE RANGE OF 0.04-0.49 ng/mL CAN BE SEEN IN:       -UNSTABLE ANGINA       -CONGESTIVE HEART FAILURE       -MYOCARDITIS       -CHEST TRAUMA       -ARRYHTHMIAS       -LATE PRESENTING MYOCARDIAL INFARCTION       -COPD   CLINICAL FOLLOW-UP RECOMMENDED.   Basic metabolic panel     Status: Abnormal   Collection Time: 11/04/14 12:00 AM  Result Value Ref Range   Sodium 141 135 - 145 mmol/L   Potassium 3.9 3.5 - 5.1 mmol/L   Chloride 113 (H) 96 - 112 mmol/L   CO2 21 19 - 32 mmol/L   Glucose, Bld 199 (H) 70 - 99 mg/dL   BUN 22 6 - 23 mg/dL   Creatinine, Ser 1.06  0.50 - 1.10 mg/dL   Calcium 8.2 (L) 8.4 - 10.5 mg/dL   GFR calc non Af Amer 52 (L) >90 mL/min   GFR calc Af Amer 61 (L) >90 mL/min    Comment: (NOTE) The eGFR has been calculated using the CKD EPI equation. This calculation has not been validated in all clinical situations. eGFR's persistently <90 mL/min signify possible Chronic Kidney Disease.    Anion  gap 7 5 - 15  Glucose, capillary     Status: Abnormal   Collection Time: 11/04/14 12:13 AM  Result Value Ref Range   Glucose-Capillary 201 (H) 70 - 99 mg/dL   Comment 1 Arterial Specimen   Glucose, capillary     Status: Abnormal   Collection Time: 11/04/14  2:15 AM  Result Value Ref Range   Glucose-Capillary 186 (H) 70 - 99 mg/dL   Comment 1 Arterial Specimen   Glucose, capillary     Status: Abnormal   Collection Time: 11/04/14  2:57 AM  Result Value Ref Range   Glucose-Capillary 177 (H) 70 - 99 mg/dL   Comment 1 Arterial Specimen   Heparin level (unfractionated)     Status: Abnormal   Collection Time: 11/04/14  3:55 AM  Result Value Ref Range   Heparin Unfractionated 0.13 (L) 0.30 - 0.70 IU/mL    Comment:        IF HEPARIN RESULTS ARE BELOW EXPECTED VALUES, AND PATIENT DOSAGE HAS BEEN CONFIRMED, SUGGEST FOLLOW UP TESTING OF ANTITHROMBIN III LEVELS.   Lactic acid, plasma     Status: None   Collection Time: 11/04/14  3:55 AM  Result Value Ref Range   Lactic Acid, Venous 1.4 0.5 - 2.0 mmol/L  Basic metabolic panel     Status: Abnormal   Collection Time: 11/04/14  3:55 AM  Result Value Ref Range   Sodium 140 135 - 145 mmol/L   Potassium 4.2 3.5 - 5.1 mmol/L   Chloride 111 96 - 112 mmol/L   CO2 22 19 - 32 mmol/L   Glucose, Bld 181 (H) 70 - 99 mg/dL   BUN 19 6 - 23 mg/dL   Creatinine, Ser 1.10 0.50 - 1.10 mg/dL   Calcium 8.2 (L) 8.4 - 10.5 mg/dL   GFR calc non Af Amer 50 (L) >90 mL/min   GFR calc Af Amer 58 (L) >90 mL/min    Comment: (NOTE) The eGFR has been calculated using the CKD EPI equation. This calculation has not been validated in all clinical situations. eGFR's persistently <90 mL/min signify possible Chronic Kidney Disease.    Anion gap 7 5 - 15  Protime-INR now and repeat in 8 hours     Status: None   Collection Time: 11/04/14  3:55 AM  Result Value Ref Range   Prothrombin Time 14.9 11.6 - 15.2 seconds   INR 1.15 0.00 - 1.49  APTT now and repeat in 8  hours     Status: Abnormal   Collection Time: 11/04/14  3:55 AM  Result Value Ref Range   aPTT 53 (H) 24 - 37 seconds    Comment:        IF BASELINE aPTT IS ELEVATED, SUGGEST PATIENT RISK ASSESSMENT BE USED TO DETERMINE APPROPRIATE ANTICOAGULANT THERAPY.   Glucose, capillary     Status: Abnormal   Collection Time: 11/04/14  4:01 AM  Result Value Ref Range   Glucose-Capillary 168 (H) 70 - 99 mg/dL   Comment 1 Arterial Specimen   Glucose, capillary     Status: Abnormal  Collection Time: 11/04/14  6:06 AM  Result Value Ref Range   Glucose-Capillary 152 (H) 70 - 99 mg/dL   Comment 1 Arterial Specimen   CBC     Status: Abnormal   Collection Time: 11/04/14  7:40 AM  Result Value Ref Range   WBC 13.9 (H) 4.0 - 10.5 K/uL   RBC 4.62 3.87 - 5.11 MIL/uL   Hemoglobin 13.7 12.0 - 15.0 g/dL   HCT 40.6 36.0 - 46.0 %   MCV 87.9 78.0 - 100.0 fL   MCH 29.7 26.0 - 34.0 pg   MCHC 33.7 30.0 - 36.0 g/dL   RDW 16.1 (H) 11.5 - 15.5 %   Platelets 154 150 - 400 K/uL  Basic metabolic panel     Status: Abnormal   Collection Time: 11/04/14  7:40 AM  Result Value Ref Range   Sodium 142 135 - 145 mmol/L   Potassium 4.0 3.5 - 5.1 mmol/L   Chloride 112 96 - 112 mmol/L   CO2 25 19 - 32 mmol/L   Glucose, Bld 150 (H) 70 - 99 mg/dL   BUN 19 6 - 23 mg/dL   Creatinine, Ser 1.13 (H) 0.50 - 1.10 mg/dL   Calcium 8.2 (L) 8.4 - 10.5 mg/dL   GFR calc non Af Amer 48 (L) >90 mL/min   GFR calc Af Amer 56 (L) >90 mL/min    Comment: (NOTE) The eGFR has been calculated using the CKD EPI equation. This calculation has not been validated in all clinical situations. eGFR's persistently <90 mL/min signify possible Chronic Kidney Disease.    Anion gap 5 5 - 15  Magnesium     Status: None   Collection Time: 11/04/14  7:40 AM  Result Value Ref Range   Magnesium 2.2 1.5 - 2.5 mg/dL  Phosphorus     Status: Abnormal   Collection Time: 11/04/14  7:40 AM  Result Value Ref Range   Phosphorus 4.7 (H) 2.3 - 4.6 mg/dL   Troponin I     Status: Abnormal   Collection Time: 11/04/14  7:40 AM  Result Value Ref Range   Troponin I 0.19 (H) <0.031 ng/mL    Comment:        PERSISTENTLY INCREASED TROPONIN VALUES IN THE RANGE OF 0.04-0.49 ng/mL CAN BE SEEN IN:       -UNSTABLE ANGINA       -CONGESTIVE HEART FAILURE       -MYOCARDITIS       -CHEST TRAUMA       -ARRYHTHMIAS       -LATE PRESENTING MYOCARDIAL INFARCTION       -COPD   CLINICAL FOLLOW-UP RECOMMENDED.   Glucose, capillary     Status: Abnormal   Collection Time: 11/04/14  7:41 AM  Result Value Ref Range   Glucose-Capillary 141 (H) 70 - 99 mg/dL    Imaging: Ct Head Wo Contrast  11/03/2014   CLINICAL DATA:  Status post cardiac arrest.  EXAM: CT HEAD WITHOUT CONTRAST  TECHNIQUE: Contiguous axial images were obtained from the base of the skull through the vertex without intravenous contrast.  COMPARISON:  None.  FINDINGS: The ventricles are normal in size and configuration. No extra-axial fluid collections are identified. The gray-white differentiation is normal. No CT findings for acute intracranial process such as hemorrhage or infarction. No mass lesions. The brainstem and cerebellum are grossly normal.  The bony structures are intact. The paranasal sinuses and mastoid air cells are clear. The globes are intact.  IMPRESSION: No acute  intracranial findings or mass lesion.   Electronically Signed   By: Marijo Sanes M.D.   On: 11/03/2014 17:57   Ct Angio Chest Pe W/cm &/or Wo Cm  11/03/2014   CLINICAL DATA:  Cardiac arrest.  EXAM: CT ANGIOGRAPHY CHEST WITH CONTRAST  TECHNIQUE: Multidetector CT imaging of the chest was performed using the standard protocol during bolus administration of intravenous contrast. Multiplanar CT image reconstructions and MIPs were obtained to evaluate the vascular anatomy.  CONTRAST:  131m OMNIPAQUE IOHEXOL 350 MG/ML SOLN  COMPARISON:  None.  FINDINGS: Chest wall: No significant findings.  Mediastinum: The heart is mildly enlarged  but stable. No pericardial effusion. No mediastinal or hilar mass or adenopathy. The endotracheal tube is in good position. Small scattered lymph nodes are noted. The esophagus is grossly normal. The aorta is tortuous and demonstrates moderate calcifications but no focal aneurysm. There is reflux of contrast down the IVC and into the hepatic veins which could be due to right heart failure or tricuspid regurgitation.  The pulmonary arterial tree is fairly well opacified. No filling defects to suggest pulmonary emboli.  Lungs/ pleura: Small bilateral pleural effusions and moderate bibasilar atelectasis. No pulmonary edema.  Upper abdomen:  No significant findings.  Review of the MIP images confirms the above findings.  IMPRESSION: 1. No definite CT findings for pulmonary embolism. 2. Mild cardiac enlargement but no pericardial effusion. 3. Reflux of contrast down the IVC likely due to right heart failure or tricuspid regurgitation. 4. Small bilateral pleural effusions and bibasilar atelectasis or infiltrates.   Electronically Signed   By: PMarijo SanesM.D.   On: 11/03/2014 18:27   Dg Chest Port 1 View  11/04/2014   CLINICAL DATA:  Pulmonary embolism  EXAM: PORTABLE CHEST - 1 VIEW  COMPARISON:  11/03/2014  FINDINGS: Endotracheal tube tip is 5.4 cm above the carina. Right jugular central line extends into the SVC. Nasogastric tube extends into the stomach. There is continued mild worsening of basilar airspace opacities and small effusions.  IMPRESSION: Support equipment appears satisfactorily positioned.  Continued mild worsening of basilar airspace opacities and small effusions   Electronically Signed   By: DAndreas NewportM.D.   On: 11/04/2014 05:02   Dg Chest Port 1 View  11/03/2014   CLINICAL DATA:  Central line placement.  Initial encounter.  EXAM: PORTABLE CHEST - 1 VIEW  COMPARISON:  Chest radiograph performed 11/02/2014, and CTA of the chest performed earlier today at 5:46 p.m.  FINDINGS: [name]  endotracheal tube is seen ending 5 cm above the carina. A right IJ line is noted ending about the mid SVC. An enteric tube is noted extending below the diaphragm.  Vascular congestion is noted, with bibasilar and perihilar airspace opacification, likely reflecting pulmonary edema. Small bilateral pleural effusions are seen. No pneumothorax is identified.  The cardiomediastinal silhouette is mildly enlarged. No acute osseous abnormalities are identified.  IMPRESSION: 1. Right IJ line noted ending about the mid SVC. 2. Endotracheal tube seen ending 5 cm above the carina. 3. Vascular congestion and mild cardiomegaly, with bibasilar perihilar airspace opacification, likely reflecting pulmonary edema. This appears somewhat worsened from the prior chest radiograph. Small bilateral pleural effusions seen.   Electronically Signed   By: JGarald BaldingM.D.   On: 11/03/2014 19:45   Dg Abd Portable 1v  11/03/2014   CLINICAL DATA:  Nasogastric tube placement.  Initial encounter.  EXAM: PORTABLE ABDOMEN - 1 VIEW  COMPARISON:  CT of the abdomen and pelvis performed  02/12/2009  FINDINGS: [name] enteric tube is noted ending overlying the body of the stomach, with the side port noted about the level of the patient's known small hiatal hernia.  The visualized bowel gas pattern is grossly unremarkable. The lung bases are better characterized on concurrent chest radiograph. No acute osseous abnormalities are identified.  IMPRESSION: Enteric tube noted ending overlying the body of the stomach, with the side port noted about the level of the patient's known small hiatal hernia.   Electronically Signed   By: Garald Balding M.D.   On: 11/03/2014 19:41    Assessment:  Principal Problem:   Cardiac arrest Active Problems:   HTN (hypertension)   Atrial fibrillation   CAP (community acquired pneumonia)   Cardiomyopathy   Hypokalemia   Acute on chronic combined systolic and diastolic HF (heart failure)   NSTEMI (non-ST  elevated myocardial infarction)   SOB (shortness of breath)   Atrial fibrillation with RVR   PEA (Pulseless electrical activity)   Acute respiratory failure with hypoxemia   Anoxic brain injury   Essential hypertension   Plan:  1. Cardiac arrest - Ms. Oshana had a brady-asystolic arrest yesterday just prior to cardiac catheterization with an unusual presentation of anxiety. I'm very concerned about underlying CAD and or electrical conduction system disease. At this point, she is hemodynamically stable, but sedated/paralyzed and on levophed to keep MAP >70 per cardiac arrest protocol. She was rewarmed overnight due to severe bradycardia. I spoke with her daughter today and feel we need to proceed with cardiac catheterization at this point, it is the safest time to do this. Ultimately, she may need a temporary and/or permanent pacemaker. I discussed this with her daughter on the phone this morning and obtained a telephone consent. 2. A-fib with RVR - intermittent with elevated troponins. Suspect ischemic etiology. At this point, she is in sinus bradycardia with PAC's - on IV heparin.  On po amiodarone 200 mg daily - will hold for bradycardia. D/c coreg. 3. Cardiomyopathy - unknown if ischemic or non-ischemic. Plan LHC today to evaluate further. LVEF 35-40% by echo this morning. 4. Pneumonia - was on antibiotics - not currently 5. Hypertension - now on levophed due to relative hypotension in the setting of recent arrest - keep MAP >70 6. Possible anoxic brain injury - EEG planned for today.   CRITICAL CARE:  The patient is critically ill with multi-organ system failure and requires high complexity decision making for assessment and support, frequent evaluation and titration of therapies, application of advanced monitoring technologies and extensive interpretation of multiple databases.  Time Spent Directly with Patient:  50 minutes  Length of Stay:  LOS: 5 days   [name] Casino, MD,  [facility] Attending Cardiologist [facility]  [name] [name] C 11/04/2014, 8:56 AM

## 2014-11-04 NOTE — Progress Notes (Signed)
ANTICOAGULATION CONSULT NOTE - Follow Up Consult  Pharmacy Consult for Heparin  Indication: atrial fibrillation  Allergies  Allergen Reactions  . Bee Venom Anaphylaxis  . Shrimp [Shellfish Allergy] Swelling    Patient Measurements: Height: 5' 7.5" (171.5 cm) Weight: 222 lb 3.6 oz (100.8 kg) IBW/kg (Calculated) : 62.75  Vital Signs: Temp: 96.1 F (35.6 C) (03/22 1300) Temp Source: Core (Comment) (03/22 0400) BP: 151/79 mmHg (03/22 1300) Pulse Rate: 59 (03/22 1349)  Labs:  Recent Labs  11/02/14 0831 11/02/14 1349 November 08, 2014 0441 2014-11-08 1820 11/04/14 11/04/14 0355 11/04/14 0740  HGB 14.3 14.3 14.7  --   --   --  13.7  HCT 43.5 43.0 43.6  --   --   --  40.6  PLT 133* 126* 123*  --   --   --  154  APTT 92*  --   --  26  --  53*  --   LABPROT  --  14.1  --  15.1  --  14.9  --   INR  --  1.08  --  1.17  --  1.15  --   HEPARINUNFRC 0.66  --  0.29*  --   --  0.13*  --   CREATININE 1.29* 1.24* 1.14* 1.49* 1.06 1.10 1.13*  TROPONINI  --   --   --  0.08* 0.25*  --  0.19*    Estimated Creatinine Clearance: 57.9 mL/min (by C-G formula based on Cr of 1.13).   Assessment: 69 year old female on IV heparin (apixaban on hold) for atrial fibrillation. Unable to tolerate cath on 11/08/2022, pt coded in cath lab.Initiated on [employer]. Severe bradycardia overnight and patient was rewarmed to 36degrees.   Heparin continued this morning and patient was taken to lab prior to level drawn. Cath found multivessel CAD, CVTS has been consulted for possible CABG. Orders to resume heparin tonight, will dose at higher rate now that she is not hypothermic. No complications noted during cath today.  Goal of Therapy:  Heparin level 0.3-0.7 units/ml Monitor platelets by anticoagulation protocol: Yes   Plan:  -Increase heparin to 1200 units/hr -start at 2030 tonight -Daily CBC/HL -Monitor for bleeding  Erin Hearing PharmD., BCPS Clinical Pharmacist Pager [phone] 11/04/2014 2:15 PM

## 2014-11-04 NOTE — Procedures (Signed)
Arterial Catheter Insertion Procedure Note [name] [name] [id number] [birth year]  Procedure: Insertion of Arterial Catheter  Indications: Blood pressure monitoring and Frequent blood sampling  Procedure Details Consent: Unable to obtain consent because of emergent medical necessity. Time Out: Verified patient identification, verified procedure, site/side was marked, verified correct patient position, special equipment/implants available, medications/allergies/relevent history reviewed, required imaging and test results available.  Performed  Maximum sterile technique was used including antiseptics, cap, gloves, gown, hand hygiene, mask and sheet. Skin prep: Chlorhexidine; local anesthetic administered 20 gauge catheter was inserted into right radial artery using the Seldinger technique.  Evaluation Blood flow good; BP tracing good. Complications: No apparent complications.  Procedure performed on 3/21.  [name] [name] 11/04/2014

## 2014-11-04 NOTE — Progress Notes (Signed)
ELink MD Deterding called RN to change hypothermia temperature management from 33 C to 36 C. All other interventions to remain the same. RN Verlin Grills, RN Lonna Duval, and RN Fernand Parkins changed arctic sun temperature management from 33C to [facility]. Pt temperature at time of was 33.8.  HR remains in low 40's. Map > 80. Ice packs removed. Will continue to monitor pt closely.

## 2014-11-04 NOTE — Progress Notes (Signed)
ANTICOAGULATION CONSULT NOTE - Follow Up Consult  Pharmacy Consult for Heparin  Indication: atrial fibrillation  Allergies  Allergen Reactions  . Bee Venom Anaphylaxis  . Shrimp [Shellfish Allergy] Swelling    Patient Measurements: Height: 5' 7.5" (171.5 cm) Weight: 214 lb 8 oz (97.297 kg) IBW/kg (Calculated) : 62.75  Vital Signs: Temp: 91.2 F (32.9 C) (03/22 0200) Temp Source: Core (Comment) (03/22 0000) BP: 131/63 mmHg (03/22 0300) Pulse Rate: 39 (03/22 0300)  Labs:  Recent Labs  11/02/14 0831 11/02/14 1349 11/03/14 0441 11/03/14 1820 11/04/14 11/04/14 0355  HGB 14.3 14.3 14.7  --   --   --   HCT 43.5 43.0 43.6  --   --   --   PLT 133* 126* 123*  --   --   --   APTT 92*  --   --  26  --  53*  LABPROT  --  14.1  --  15.1  --  14.9  INR  --  1.08  --  1.17  --  1.15  HEPARINUNFRC 0.66  --  0.29*  --   --  0.13*  CREATININE 1.29* 1.24* 1.14* 1.49* 1.06 1.10  TROPONINI  --   --   --  0.08* 0.25*  --     Estimated Creatinine Clearance: 58.4 mL/min (by C-G formula based on Cr of 1.1).   Assessment: Sub-therapeutic heparin level (was changed back to goal temp of 36 degrees).   Goal of Therapy:  Heparin level 0.3-0.7 units/ml Monitor platelets by anticoagulation protocol: Yes   Plan:  -Increase heparin to 1000 units/hr (heparin needs will likely increase with goal temp changed to 36 degrees) -1200 HL -Daily CBC/HL -Monitor for bleeding  Narda Bonds 11/04/2014,4:50 AM

## 2014-11-04 NOTE — Progress Notes (Signed)
Reginal Lutes MD to verify x-ray for central line placement. Elink MD gave RN approval to begin using central line for medications.

## 2014-11-05 ENCOUNTER — Encounter (HOSPITAL_COMMUNITY): Payer: Self-pay | Admitting: Cardiovascular Disease

## 2014-11-05 ENCOUNTER — Inpatient Hospital Stay (HOSPITAL_COMMUNITY): Payer: Commercial Managed Care - HMO

## 2014-11-05 DIAGNOSIS — I2510 Atherosclerotic heart disease of native coronary artery without angina pectoris: Secondary | ICD-10-CM

## 2014-11-05 LAB — HEPARIN LEVEL (UNFRACTIONATED)
Heparin Unfractionated: 0.41 IU/mL (ref 0.30–0.70)
Heparin Unfractionated: 0.47 IU/mL (ref 0.30–0.70)

## 2014-11-05 LAB — BASIC METABOLIC PANEL
Anion gap: 4 — ABNORMAL LOW (ref 5–15)
Anion gap: 7 (ref 5–15)
Anion gap: 4 — ABNORMAL LOW (ref 5–15)
Anion gap: 4 — ABNORMAL LOW (ref 5–15)
BUN: 18 mg/dL (ref 6–23)
BUN: 15 mg/dL (ref 6–23)
BUN: 15 mg/dL (ref 6–23)
BUN: 20 mg/dL (ref 6–23)
CO2: 20 mmol/L (ref 19–32)
CO2: 19 mmol/L (ref 19–32)
CO2: 20 mmol/L (ref 19–32)
CO2: 22 mmol/L (ref 19–32)
Calcium: 8.5 mg/dL (ref 8.4–10.5)
Calcium: 8.3 mg/dL — ABNORMAL LOW (ref 8.4–10.5)
Calcium: 7.8 mg/dL — ABNORMAL LOW (ref 8.4–10.5)
Calcium: 8.6 mg/dL (ref 8.4–10.5)
Chloride: 117 mmol/L — ABNORMAL HIGH (ref 96–112)
Chloride: 115 mmol/L — ABNORMAL HIGH (ref 96–112)
Chloride: 119 mmol/L — ABNORMAL HIGH (ref 96–112)
Chloride: 116 mmol/L — ABNORMAL HIGH (ref 96–112)
Creatinine, Ser: 1.02 mg/dL (ref 0.50–1.10)
Creatinine, Ser: 1.04 mg/dL (ref 0.50–1.10)
Creatinine, Ser: 1.09 mg/dL (ref 0.50–1.10)
Creatinine, Ser: 1.21 mg/dL — ABNORMAL HIGH (ref 0.50–1.10)
GFR calc Af Amer: 64 mL/min — ABNORMAL LOW (ref >90)
GFR calc Af Amer: 62 mL/min — ABNORMAL LOW (ref >90)
GFR calc Af Amer: 59 mL/min — ABNORMAL LOW (ref >90)
GFR calc Af Amer: 52 mL/min — ABNORMAL LOW (ref >90)
GFR calc non Af Amer: 55 mL/min — ABNORMAL LOW (ref >90)
GFR calc non Af Amer: 54 mL/min — ABNORMAL LOW (ref >90)
GFR calc non Af Amer: 51 mL/min — ABNORMAL LOW (ref >90)
GFR calc non Af Amer: 45 mL/min — ABNORMAL LOW (ref >90)
Glucose, Bld: 103 mg/dL — ABNORMAL HIGH (ref 70–99)
Glucose, Bld: 104 mg/dL — ABNORMAL HIGH (ref 70–99)
Glucose, Bld: 95 mg/dL (ref 70–99)
Glucose, Bld: 119 mg/dL — ABNORMAL HIGH (ref 70–99)
Potassium: 3.5 mmol/L (ref 3.5–5.1)
Potassium: 3.4 mmol/L — ABNORMAL LOW (ref 3.5–5.1)
Potassium: 3.1 mmol/L — ABNORMAL LOW (ref 3.5–5.1)
Potassium: 3.4 mmol/L — ABNORMAL LOW (ref 3.5–5.1)
Sodium: 141 mmol/L (ref 135–145)
Sodium: 141 mmol/L (ref 135–145)
Sodium: 143 mmol/L (ref 135–145)
Sodium: 142 mmol/L (ref 135–145)

## 2014-11-05 LAB — CBC WITH DIFFERENTIAL/PLATELET
Basophils Absolute: 0 10*3/uL (ref 0.0–0.1)
Basophils Relative: 0 % (ref 0–1)
Eosinophils Absolute: 0.1 10*3/uL (ref 0.0–0.7)
Eosinophils Relative: 0 % (ref 0–5)
HCT: 39 % (ref 36.0–46.0)
Hemoglobin: 13.1 g/dL (ref 12.0–15.0)
Lymphocytes Relative: 25 % (ref 12–46)
Lymphs Abs: 3.3 10*3/uL (ref 0.7–4.0)
MCH: 29.4 pg (ref 26.0–34.0)
MCHC: 33.6 g/dL (ref 30.0–36.0)
MCV: 87.6 fL (ref 78.0–100.0)
Monocytes Absolute: 1 10*3/uL (ref 0.1–1.0)
Monocytes Relative: 7 % (ref 3–12)
Neutro Abs: 8.8 10*3/uL — ABNORMAL HIGH (ref 1.7–7.7)
Neutrophils Relative %: 67 % (ref 43–77)
Platelets: 110 10*3/uL — ABNORMAL LOW (ref 150–400)
RBC: 4.45 MIL/uL (ref 3.87–5.11)
RDW: 16.2 % — ABNORMAL HIGH (ref 11.5–15.5)
WBC: 13.1 10*3/uL — ABNORMAL HIGH (ref 4.0–10.5)

## 2014-11-05 LAB — GLUCOSE, CAPILLARY
Glucose-Capillary: 117 mg/dL — ABNORMAL HIGH (ref 70–99)
Glucose-Capillary: 88 mg/dL (ref 70–99)
Glucose-Capillary: 89 mg/dL (ref 70–99)
Glucose-Capillary: 104 mg/dL — ABNORMAL HIGH (ref 70–99)
Glucose-Capillary: 127 mg/dL — ABNORMAL HIGH (ref 70–99)
Glucose-Capillary: 98 mg/dL (ref 70–99)
Glucose-Capillary: 85 mg/dL (ref 70–99)

## 2014-11-05 LAB — POCT ACTIVATED CLOTTING TIME: Activated Clotting Time: 110 seconds

## 2014-11-05 LAB — MAGNESIUM: Magnesium: 1.7 mg/dL (ref 1.5–2.5)

## 2014-11-05 LAB — CULTURE, BLOOD (ROUTINE X 2)
Culture: NO GROWTH
Culture: NO GROWTH

## 2014-11-05 LAB — PHOSPHORUS: Phosphorus: 2.5 mg/dL (ref 2.3–4.6)

## 2014-11-05 MED ORDER — DEXTROSE 5 % IV SOLN
0.0000 ug/min | INTRAVENOUS | Status: DC
  Administered 2014-11-05: 3 ug/min via INTRAVENOUS
  Filled 2014-11-05: qty 4

## 2014-11-05 MED ORDER — DEXMEDETOMIDINE HCL IN NACL 200 MCG/50ML IV SOLN
0.0000 ug/kg/h | INTRAVENOUS | Status: DC
  Administered 2014-11-06: 0.5 ug/kg/h via INTRAVENOUS
  Administered 2014-11-06 (×4): 0.4 ug/kg/h via INTRAVENOUS
  Administered 2014-11-06: 0.3 ug/kg/h via INTRAVENOUS
  Filled 2014-11-05 (×5): qty 50

## 2014-11-05 MED ORDER — DOCUSATE SODIUM 50 MG/5ML PO LIQD
100.0000 mg | Freq: Every day | ORAL | Status: DC
  Administered 2014-11-05 – 2014-11-12 (×8): 100 mg via ORAL
  Filled 2014-11-05 (×9): qty 10

## 2014-11-05 NOTE — Progress Notes (Signed)
Reginal Lutes MD (Deterding) to verify when to initiate rewarming phase of hypothermia treatment due to change from 33C to 36C temperature management. Per MD, rewarming will start 24 hours after met 33C.

## 2014-11-05 NOTE — Progress Notes (Signed)
[facility] Progress Note Patient Name: [name] [name] DOB: 1946/01/06 MRN: [id number]   Date of Service  11/05/2014  HPI/Events of Note  Temp management started 8 pm 21st Pt agitated still   eICU Interventions  She is normothermic, keep TTM till 8 pm 24th     Intervention Category Major Interventions: OtherRaylene Miyamoto. 11/05/2014, 8:26 PM

## 2014-11-05 NOTE — Progress Notes (Signed)
DAILY PROGRESS NOTE  Subjective:  Intubated - off sedation. Arouses to stimuli. Does not follow commands.  On low dose levophed. Currently v-paced at 35.  I held the pacer and she has a sinus bradycardia underlying in the low 50's.  I decreased the backup rate to 50, however, she was intermittently pacing at that point - I think she will still require pacing at this point. CT surgery evaluated, at this point awaiting neurologic recovery and extubation before contemplating surgery.  CVP this am is 12. Not currently on diuretics.  Objective:  Temp:  [94.5 F (34.7 C)-98.4 F (36.9 C)] 98.4 F (36.9 C) (03/23 0700) Pulse Rate:  [36-62] 59 (03/23 0700) Resp:  [0-28] 28 (03/23 0344) BP: (98-162)/(46-87) 121/63 mmHg (03/23 0700) SpO2:  [90 %-100 %] 100 % (03/23 0700) Arterial Line BP: (116-178)/(53-87) 135/68 mmHg (03/23 0700) FiO2 (%):  [40 %] 40 % (03/23 0747) Weight:  [220 lb 14.4 oz (100.2 kg)] 220 lb 14.4 oz (100.2 kg) (03/23 0500) Weight change: -1 lb 5.2 oz (-0.6 kg)  Intake/Output from previous day: 03/22 0701 - 03/23 0700 In: 2244.4 [I.V.:2244.4] Out: 1040 [Urine:915; Emesis/NG output:125]  Intake/Output from this shift:    Medications: Current Facility-Administered Medications  Medication Dose Route Frequency Provider Last Rate Last Dose  . 0.9 %  sodium chloride infusion   Intravenous Continuous Troy Sine, MD 50 mL/hr at 11/05/14 [phone]    . acetaminophen (TYLENOL) tablet 650 mg  650 mg Oral Q4H PRN Troy Sine, MD      . antiseptic oral rinse (CPC / CETYLPYRIDINIUM CHLORIDE 0.05%) solution 7 mL  7 mL Mouth Rinse QID Corey Harold, NP   7 mL at 11/05/14 0400  . artificial tears (LACRILUBE) ophthalmic ointment 1 application  1 application Both Eyes 3 times per day Corey Harold, NP   1 application at 22/02/54 0501  . aspirin EC tablet 81 mg  81 mg Oral Daily Troy Sine, MD   0 mg at 11/04/14 1315  . atorvastatin (LIPITOR) tablet 80 mg  80 mg Oral q1800 Troy Sine, MD   80 mg at 11/04/14 1808  . chlorhexidine (PERIDEX) 0.12 % solution 15 mL  15 mL Mouth Rinse BID Corey Harold, NP   15 mL at 11/05/14 0805  . docusate sodium (COLACE) capsule 100 mg  100 mg Oral Daily Verlee Monte, MD   100 mg at 11/03/14 0950  . fentaNYL (SUBLIMAZE) 2,500 mcg in sodium chloride 0.9 % 250 mL (10 mcg/mL) infusion  25-400 mcg/hr Intravenous Continuous Corey Harold, NP 10 mL/hr at 11/05/14 0728 100 mcg/hr at 11/05/14 0728  . fentaNYL (SUBLIMAZE) bolus via infusion 25 mcg  25 mcg Intravenous Q30 min PRN Corey Harold, NP   25 mcg at 11/03/14 2006  . heparin ADULT infusion 100 units/mL (25000 units/250 mL)  1,200 Units/hr Intravenous Continuous Lyndee Leo, RPH 12 mL/hr at 11/05/14 0728 1,200 Units/hr at 11/05/14 2706  . hydrALAZINE (APRESOLINE) injection 10-40 mg  10-40 mg Intravenous Q4H PRN Corey Harold, NP   20 mg at 11/03/14 1840  . insulin aspart (novoLOG) injection 0-15 Units  0-15 Units Subcutaneous 6 times per day Juanito Doom, MD   2 Units at 11/04/14 0751  . midazolam (VERSED) 50 mg in sodium chloride 0.9 % 50 mL (1 mg/mL) infusion  1-10 mg/hr Intravenous Continuous Juanito Doom, MD 2 mL/hr at 11/05/14 0729 2 mg/hr at 11/05/14 0729  .  norepinephrine (LEVOPHED) 4 mg in dextrose 5 % 250 mL (0.016 mg/mL) infusion  0-40 mcg/min Intravenous Continuous Verlee Monte, MD 11.3 mL/hr at 11/05/14 0530 3 mcg/min at 11/05/14 0530  . ondansetron (ZOFRAN) injection 4 mg  4 mg Intravenous Q6H PRN Troy Sine, MD      . pantoprazole (PROTONIX) injection 40 mg  40 mg Intravenous Q24H Corey Harold, NP   40 mg at 11/04/14 8110    Physical Exam: General appearance: intubed on vent, off sedation, re-warmed Neck: no carotid bruit and no JVD Lungs: clear to auscultation bilaterally Heart: regular rhythm - paced Abdomen: soft, no masses Extremities: extremities normal, atraumatic, no cyanosis or edema Pulses: 2+ and symmetric Skin: Skin color, texture, turgor  normal. No rashes or lesions Neurologic: Mental status: arouses to stimuli, non-purposeful  movements Psych: Cannot assess  Lab Results: Results for orders placed or performed during the hospital encounter of 10/29/14 (from the past 48 hour(s))  MRSA PCR Screening     Status: None   Collection Time: 11/03/14  6:08 PM  Result Value Ref Range   MRSA by PCR NEGATIVE NEGATIVE    Comment:        The GeneXpert MRSA Assay (FDA approved for NASAL specimens only), is one component of a comprehensive MRSA colonization surveillance program. It is not intended to diagnose MRSA infection nor to guide or monitor treatment for MRSA infections.   Troponin I     Status: Abnormal   Collection Time: 11/03/14  6:20 PM  Result Value Ref Range   Troponin I 0.08 (H) <0.031 ng/mL    Comment:        PERSISTENTLY INCREASED TROPONIN VALUES IN THE RANGE OF 0.04-0.49 ng/mL CAN BE SEEN IN:       -UNSTABLE ANGINA       -CONGESTIVE HEART FAILURE       -MYOCARDITIS       -CHEST TRAUMA       -ARRYHTHMIAS       -LATE PRESENTING MYOCARDIAL INFARCTION       -COPD   CLINICAL FOLLOW-UP RECOMMENDED.   Basic metabolic panel     Status: Abnormal   Collection Time: 11/03/14  6:20 PM  Result Value Ref Range   Sodium 141 135 - 145 mmol/L   Potassium 3.6 3.5 - 5.1 mmol/L   Chloride 109 96 - 112 mmol/L   CO2 21 19 - 32 mmol/L   Glucose, Bld 242 (H) 70 - 99 mg/dL   BUN 23 6 - 23 mg/dL   Creatinine, Ser 1.49 (H) 0.50 - 1.10 mg/dL   Calcium 8.1 (L) 8.4 - 10.5 mg/dL   GFR calc non Af Amer 35 (L) >90 mL/min   GFR calc Af Amer 40 (L) >90 mL/min    Comment: (NOTE) The eGFR has been calculated using the CKD EPI equation. This calculation has not been validated in all clinical situations. eGFR's persistently <90 mL/min signify possible Chronic Kidney Disease.    Anion gap 11 5 - 15  Protime-INR now and repeat in 8 hours     Status: None   Collection Time: 11/03/14  6:20 PM  Result Value Ref Range   Prothrombin  Time 15.1 11.6 - 15.2 seconds   INR 1.17 0.00 - 1.49  APTT now and repeat in 8 hours     Status: None   Collection Time: 11/03/14  6:20 PM  Result Value Ref Range   aPTT 26 24 - 37 seconds  Blood gas, arterial  Status: Abnormal   Collection Time: 11/03/14  6:30 PM  Result Value Ref Range   FIO2 1.00 %   Delivery systems VENTILATOR    Mode PRESSURE CONTROL    Rate 20 resp/min   Peep/cpap 10.0 cm H20   Pressure control 20.0 cm H20   pH, Arterial 7.239 (L) 7.350 - 7.450   pCO2 arterial 46.6 (H) 35.0 - 45.0 mmHg   pO2, Arterial 202.0 (H) 80.0 - 100.0 mmHg   Bicarbonate 19.4 (L) 20.0 - 24.0 mEq/L   TCO2 20.9 0 - 100 mmol/L   Acid-base deficit 6.9 (H) 0.0 - 2.0 mmol/L   O2 Saturation 98.6 %   Patient temperature 97.5    Drawn by COLLECTED BY NURSE    Sample type ARTERIAL DRAW    Allens test (pass/fail) PASS PASS  Glucose, capillary     Status: Abnormal   Collection Time: 11/03/14  7:53 PM  Result Value Ref Range   Glucose-Capillary 169 (H) 70 - 99 mg/dL  I-STAT, chem 8     Status: Abnormal   Collection Time: 11/03/14  7:56 PM  Result Value Ref Range   Sodium 145 135 - 145 mmol/L   Potassium 3.3 (L) 3.5 - 5.1 mmol/L   Chloride 110 96 - 112 mmol/L   BUN 23 6 - 23 mg/dL   Creatinine, Ser 1.30 (H) 0.50 - 1.10 mg/dL   Glucose, Bld 186 (H) 70 - 99 mg/dL   Calcium, Ion 1.16 1.13 - 1.30 mmol/L   TCO2 18 0 - 100 mmol/L   Hemoglobin 12.9 12.0 - 15.0 g/dL   HCT 38.0 36.0 - 46.0 %  Glucose, capillary     Status: Abnormal   Collection Time: 11/03/14  9:00 PM  Result Value Ref Range   Glucose-Capillary 139 (H) 70 - 99 mg/dL   Comment 1 Arterial Specimen   Glucose, capillary     Status: Abnormal   Collection Time: 11/03/14 10:00 PM  Result Value Ref Range   Glucose-Capillary 163 (H) 70 - 99 mg/dL   Comment 1 Arterial Specimen   I-STAT, chem 8     Status: Abnormal   Collection Time: 11/03/14 10:08 PM  Result Value Ref Range   Sodium 146 (H) 135 - 145 mmol/L   Potassium 3.7 3.5 -  5.1 mmol/L   Chloride 112 96 - 112 mmol/L   BUN 22 6 - 23 mg/dL   Creatinine, Ser 1.10 0.50 - 1.10 mg/dL   Glucose, Bld 167 (H) 70 - 99 mg/dL   Calcium, Ion 1.17 1.13 - 1.30 mmol/L   TCO2 18 0 - 100 mmol/L   Hemoglobin 14.3 12.0 - 15.0 g/dL   HCT 42.0 36.0 - 46.0 %  Glucose, capillary     Status: Abnormal   Collection Time: 11/03/14 11:30 PM  Result Value Ref Range   Glucose-Capillary 176 (H) 70 - 99 mg/dL   Comment 1 Arterial Specimen   Troponin I     Status: Abnormal   Collection Time: 11/04/14 12:00 AM  Result Value Ref Range   Troponin I 0.25 (H) <0.031 ng/mL    Comment:        PERSISTENTLY INCREASED TROPONIN VALUES IN THE RANGE OF 0.04-0.49 ng/mL CAN BE SEEN IN:       -UNSTABLE ANGINA       -CONGESTIVE HEART FAILURE       -MYOCARDITIS       -CHEST TRAUMA       -ARRYHTHMIAS       -LATE  PRESENTING MYOCARDIAL INFARCTION       -COPD   CLINICAL FOLLOW-UP RECOMMENDED.   Basic metabolic panel     Status: Abnormal   Collection Time: 11/04/14 12:00 AM  Result Value Ref Range   Sodium 141 135 - 145 mmol/L   Potassium 3.9 3.5 - 5.1 mmol/L   Chloride 113 (H) 96 - 112 mmol/L   CO2 21 19 - 32 mmol/L   Glucose, Bld 199 (H) 70 - 99 mg/dL   BUN 22 6 - 23 mg/dL   Creatinine, Ser 1.06 0.50 - 1.10 mg/dL   Calcium 8.2 (L) 8.4 - 10.5 mg/dL   GFR calc non Af Amer 52 (L) >90 mL/min   GFR calc Af Amer 61 (L) >90 mL/min    Comment: (NOTE) The eGFR has been calculated using the CKD EPI equation. This calculation has not been validated in all clinical situations. eGFR's persistently <90 mL/min signify possible Chronic Kidney Disease.    Anion gap 7 5 - 15  Glucose, capillary     Status: Abnormal   Collection Time: 11/04/14 12:13 AM  Result Value Ref Range   Glucose-Capillary 201 (H) 70 - 99 mg/dL   Comment 1 Arterial Specimen   Glucose, capillary     Status: Abnormal   Collection Time: 11/04/14  2:15 AM  Result Value Ref Range   Glucose-Capillary 186 (H) 70 - 99 mg/dL   Comment 1  Arterial Specimen   Glucose, capillary     Status: Abnormal   Collection Time: 11/04/14  2:57 AM  Result Value Ref Range   Glucose-Capillary 177 (H) 70 - 99 mg/dL   Comment 1 Arterial Specimen   Heparin level (unfractionated)     Status: Abnormal   Collection Time: 11/04/14  3:55 AM  Result Value Ref Range   Heparin Unfractionated 0.13 (L) 0.30 - 0.70 IU/mL    Comment:        IF HEPARIN RESULTS ARE BELOW EXPECTED VALUES, AND PATIENT DOSAGE HAS BEEN CONFIRMED, SUGGEST FOLLOW UP TESTING OF ANTITHROMBIN III LEVELS.   Lactic acid, plasma     Status: None   Collection Time: 11/04/14  3:55 AM  Result Value Ref Range   Lactic Acid, Venous 1.4 0.5 - 2.0 mmol/L  Basic metabolic panel     Status: Abnormal   Collection Time: 11/04/14  3:55 AM  Result Value Ref Range   Sodium 140 135 - 145 mmol/L   Potassium 4.2 3.5 - 5.1 mmol/L   Chloride 111 96 - 112 mmol/L   CO2 22 19 - 32 mmol/L   Glucose, Bld 181 (H) 70 - 99 mg/dL   BUN 19 6 - 23 mg/dL   Creatinine, Ser 1.10 0.50 - 1.10 mg/dL   Calcium 8.2 (L) 8.4 - 10.5 mg/dL   GFR calc non Af Amer 50 (L) >90 mL/min   GFR calc Af Amer 58 (L) >90 mL/min    Comment: (NOTE) The eGFR has been calculated using the CKD EPI equation. This calculation has not been validated in all clinical situations. eGFR's persistently <90 mL/min signify possible Chronic Kidney Disease.    Anion gap 7 5 - 15  Protime-INR now and repeat in 8 hours     Status: None   Collection Time: 11/04/14  3:55 AM  Result Value Ref Range   Prothrombin Time 14.9 11.6 - 15.2 seconds   INR 1.15 0.00 - 1.49  APTT now and repeat in 8 hours     Status: Abnormal   Collection Time:  11/04/14  3:55 AM  Result Value Ref Range   aPTT 53 (H) 24 - 37 seconds    Comment:        IF BASELINE aPTT IS ELEVATED, SUGGEST PATIENT RISK ASSESSMENT BE USED TO DETERMINE APPROPRIATE ANTICOAGULANT THERAPY.   Glucose, capillary     Status: Abnormal   Collection Time: 11/04/14  4:01 AM  Result  Value Ref Range   Glucose-Capillary 168 (H) 70 - 99 mg/dL   Comment 1 Arterial Specimen   Glucose, capillary     Status: Abnormal   Collection Time: 11/04/14  6:06 AM  Result Value Ref Range   Glucose-Capillary 152 (H) 70 - 99 mg/dL   Comment 1 Arterial Specimen   CBC     Status: Abnormal   Collection Time: 11/04/14  7:40 AM  Result Value Ref Range   WBC 13.9 (H) 4.0 - 10.5 K/uL   RBC 4.62 3.87 - 5.11 MIL/uL   Hemoglobin 13.7 12.0 - 15.0 g/dL   HCT 40.6 36.0 - 46.0 %   MCV 87.9 78.0 - 100.0 fL   MCH 29.7 26.0 - 34.0 pg   MCHC 33.7 30.0 - 36.0 g/dL   RDW 16.1 (H) 11.5 - 15.5 %   Platelets 154 150 - 400 K/uL  Basic metabolic panel     Status: Abnormal   Collection Time: 11/04/14  7:40 AM  Result Value Ref Range   Sodium 142 135 - 145 mmol/L   Potassium 4.0 3.5 - 5.1 mmol/L   Chloride 112 96 - 112 mmol/L   CO2 25 19 - 32 mmol/L   Glucose, Bld 150 (H) 70 - 99 mg/dL   BUN 19 6 - 23 mg/dL   Creatinine, Ser 1.13 (H) 0.50 - 1.10 mg/dL   Calcium 8.2 (L) 8.4 - 10.5 mg/dL   GFR calc non Af Amer 48 (L) >90 mL/min   GFR calc Af Amer 56 (L) >90 mL/min    Comment: (NOTE) The eGFR has been calculated using the CKD EPI equation. This calculation has not been validated in all clinical situations. eGFR's persistently <90 mL/min signify possible Chronic Kidney Disease.    Anion gap 5 5 - 15  Magnesium     Status: None   Collection Time: 11/04/14  7:40 AM  Result Value Ref Range   Magnesium 2.2 1.5 - 2.5 mg/dL  Phosphorus     Status: Abnormal   Collection Time: 11/04/14  7:40 AM  Result Value Ref Range   Phosphorus 4.7 (H) 2.3 - 4.6 mg/dL  Troponin I     Status: Abnormal   Collection Time: 11/04/14  7:40 AM  Result Value Ref Range   Troponin I 0.19 (H) <0.031 ng/mL    Comment:        PERSISTENTLY INCREASED TROPONIN VALUES IN THE RANGE OF 0.04-0.49 ng/mL CAN BE SEEN IN:       -UNSTABLE ANGINA       -CONGESTIVE HEART FAILURE       -MYOCARDITIS       -CHEST TRAUMA        -ARRYHTHMIAS       -LATE PRESENTING MYOCARDIAL INFARCTION       -COPD   CLINICAL FOLLOW-UP RECOMMENDED.   Glucose, capillary     Status: Abnormal   Collection Time: 11/04/14  7:41 AM  Result Value Ref Range   Glucose-Capillary 141 (H) 70 - 99 mg/dL  Glucose, capillary     Status: Abnormal   Collection Time: 11/04/14 10:37 AM  Result  Value Ref Range   Glucose-Capillary 117 (H) 70 - 99 mg/dL   Comment 1 Capillary Specimen   Blood gas, arterial     Status: Abnormal   Collection Time: 11/04/14 10:45 AM  Result Value Ref Range   FIO2 0.60 %   Delivery systems VENTILATOR    Mode PRESSURE CONTROL    Rate 20 resp/min   Peep/cpap 10.0 cm H20   pH, Arterial 7.277 (L) 7.350 - 7.450   pCO2 arterial 47.6 (H) 35.0 - 45.0 mmHg   pO2, Arterial 144.0 (H) 80.0 - 100.0 mmHg   Bicarbonate 21.5 20.0 - 24.0 mEq/L   TCO2 23.0 0 - 100 mmol/L   Acid-base deficit 4.2 (H) 0.0 - 2.0 mmol/L   O2 Saturation 98.3 %   Patient temperature 98.6    Collection site A-LINE    Drawn by COLLECTED BY NURSE    Sample type ARTERIAL DRAW    Allens test (pass/fail) PASS PASS  Glucose, capillary     Status: Abnormal   Collection Time: 11/04/14  2:05 PM  Result Value Ref Range   Glucose-Capillary 119 (H) 70 - 99 mg/dL  Blood gas, arterial     Status: Abnormal   Collection Time: 11/04/14  2:15 PM  Result Value Ref Range   FIO2 0.60 %   Delivery systems VENTILATOR    Mode PRESSURE REGULATED VOLUME CONTROL    VT 500 mL   Rate 28 resp/min   Peep/cpap 8.0 cm H20   pH, Arterial 7.446 7.350 - 7.450   pCO2 arterial 27.6 (L) 35.0 - 45.0 mmHg   pO2, Arterial 157.0 (H) 80.0 - 100.0 mmHg   Bicarbonate 18.7 (L) 20.0 - 24.0 mEq/L   TCO2 19.5 0 - 100 mmol/L   Acid-base deficit 4.7 (H) 0.0 - 2.0 mmol/L   O2 Saturation 98.9 %   Patient temperature 98.6    Collection site A-LINE    Drawn by [id number]    Sample type ARTERIAL DRAW    Allens test (pass/fail) PASS PASS  Basic metabolic panel     Status: Abnormal   Collection  Time: 11/04/14  4:30 PM  Result Value Ref Range   Sodium 141 135 - 145 mmol/L   Potassium 3.3 (L) 3.5 - 5.1 mmol/L    Comment: DELTA CHECK NOTED   Chloride 117 (H) 96 - 112 mmol/L   CO2 19 19 - 32 mmol/L   Glucose, Bld 91 70 - 99 mg/dL   BUN 20 6 - 23 mg/dL   Creatinine, Ser 1.15 (H) 0.50 - 1.10 mg/dL   Calcium 8.4 8.4 - 10.5 mg/dL   GFR calc non Af Amer 47 (L) >90 mL/min   GFR calc Af Amer 55 (L) >90 mL/min    Comment: (NOTE) The eGFR has been calculated using the CKD EPI equation. This calculation has not been validated in all clinical situations. eGFR's persistently <90 mL/min signify possible Chronic Kidney Disease.    Anion gap 5 5 - 15  Glucose, capillary     Status: None   Collection Time: 11/04/14  4:44 PM  Result Value Ref Range   Glucose-Capillary 91 70 - 99 mg/dL   Comment 1 Capillary Specimen   Glucose, capillary     Status: None   Collection Time: 11/04/14  6:02 PM  Result Value Ref Range   Glucose-Capillary 88 70 - 99 mg/dL  Basic metabolic panel     Status: Abnormal   Collection Time: 11/04/14  8:15 PM  Result Value Ref Range  Sodium 141 135 - 145 mmol/L   Potassium 3.6 3.5 - 5.1 mmol/L   Chloride 116 (H) 96 - 112 mmol/L   CO2 18 (L) 19 - 32 mmol/L   Glucose, Bld 89 70 - 99 mg/dL   BUN 21 6 - 23 mg/dL   Creatinine, Ser 1.06 0.50 - 1.10 mg/dL   Calcium 8.6 8.4 - 10.5 mg/dL   GFR calc non Af Amer 52 (L) >90 mL/min   GFR calc Af Amer 61 (L) >90 mL/min    Comment: (NOTE) The eGFR has been calculated using the CKD EPI equation. This calculation has not been validated in all clinical situations. eGFR's persistently <90 mL/min signify possible Chronic Kidney Disease.    Anion gap 7 5 - 15  Glucose, capillary     Status: None   Collection Time: 11/04/14  8:15 PM  Result Value Ref Range   Glucose-Capillary 88 70 - 99 mg/dL   Comment 1 Arterial Specimen   Basic metabolic panel     Status: Abnormal   Collection Time: 11/05/14 12:01 AM  Result Value Ref  Range   Sodium 141 135 - 145 mmol/L   Potassium 3.5 3.5 - 5.1 mmol/L   Chloride 117 (H) 96 - 112 mmol/L   CO2 20 19 - 32 mmol/L   Glucose, Bld 103 (H) 70 - 99 mg/dL   BUN 18 6 - 23 mg/dL   Creatinine, Ser 1.02 0.50 - 1.10 mg/dL   Calcium 8.5 8.4 - 10.5 mg/dL   GFR calc non Af Amer 55 (L) >90 mL/min   GFR calc Af Amer 64 (L) >90 mL/min    Comment: (NOTE) The eGFR has been calculated using the CKD EPI equation. This calculation has not been validated in all clinical situations. eGFR's persistently <90 mL/min signify possible Chronic Kidney Disease.    Anion gap 4 (L) 5 - 15  Glucose, capillary     Status: None   Collection Time: 11/05/14 12:10 AM  Result Value Ref Range   Glucose-Capillary 89 70 - 99 mg/dL   Comment 1 Arterial Specimen   Glucose, capillary     Status: Abnormal   Collection Time: 11/05/14  3:50 AM  Result Value Ref Range   Glucose-Capillary 117 (H) 70 - 99 mg/dL   Comment 1 Arterial Specimen   Basic metabolic panel     Status: Abnormal   Collection Time: 11/05/14  4:00 AM  Result Value Ref Range   Sodium 141 135 - 145 mmol/L   Potassium 3.4 (L) 3.5 - 5.1 mmol/L   Chloride 115 (H) 96 - 112 mmol/L   CO2 19 19 - 32 mmol/L   Glucose, Bld 104 (H) 70 - 99 mg/dL   BUN 15 6 - 23 mg/dL   Creatinine, Ser 1.04 0.50 - 1.10 mg/dL   Calcium 8.3 (L) 8.4 - 10.5 mg/dL   GFR calc non Af Amer 54 (L) >90 mL/min   GFR calc Af Amer 62 (L) >90 mL/min    Comment: (NOTE) The eGFR has been calculated using the CKD EPI equation. This calculation has not been validated in all clinical situations. eGFR's persistently <90 mL/min signify possible Chronic Kidney Disease.    Anion gap 7 5 - 15  Magnesium     Status: None   Collection Time: 11/05/14  4:00 AM  Result Value Ref Range   Magnesium 1.7 1.5 - 2.5 mg/dL  Phosphorus     Status: None   Collection Time: 11/05/14  4:00 AM  Result Value Ref Range   Phosphorus 2.5 2.3 - 4.6 mg/dL  CBC with Differential/Platelet     Status:  Abnormal   Collection Time: 11/05/14  4:00 AM  Result Value Ref Range   WBC 13.1 (H) 4.0 - 10.5 K/uL   RBC 4.45 3.87 - 5.11 MIL/uL   Hemoglobin 13.1 12.0 - 15.0 g/dL   HCT 39.0 36.0 - 46.0 %   MCV 87.6 78.0 - 100.0 fL   MCH 29.4 26.0 - 34.0 pg   MCHC 33.6 30.0 - 36.0 g/dL   RDW 16.2 (H) 11.5 - 15.5 %   Platelets 110 (L) 150 - 400 K/uL    Comment: PLATELET COUNT CONFIRMED BY SMEAR DELTA CHECK NOTED    Neutrophils Relative % 67 43 - 77 %   Neutro Abs 8.8 (H) 1.7 - 7.7 K/uL   Lymphocytes Relative 25 12 - 46 %   Lymphs Abs 3.3 0.7 - 4.0 K/uL   Monocytes Relative 7 3 - 12 %   Monocytes Absolute 1.0 0.1 - 1.0 K/uL   Eosinophils Relative 0 0 - 5 %   Eosinophils Absolute 0.1 0.0 - 0.7 K/uL   Basophils Relative 0 0 - 1 %   Basophils Absolute 0.0 0.0 - 0.1 K/uL  Heparin level (unfractionated)     Status: None   Collection Time: 11/05/14  4:00 AM  Result Value Ref Range   Heparin Unfractionated 0.41 0.30 - 0.70 IU/mL    Comment:        IF HEPARIN RESULTS ARE BELOW EXPECTED VALUES, AND PATIENT DOSAGE HAS BEEN CONFIRMED, SUGGEST FOLLOW UP TESTING OF ANTITHROMBIN III LEVELS.   Glucose, capillary     Status: Abnormal   Collection Time: 11/05/14  7:52 AM  Result Value Ref Range   Glucose-Capillary 104 (H) 70 - 99 mg/dL   Comment 1 Arterial Specimen     Imaging: Ct Head Wo Contrast  11/03/2014   CLINICAL DATA:  Status post cardiac arrest.  EXAM: CT HEAD WITHOUT CONTRAST  TECHNIQUE: Contiguous axial images were obtained from the base of the skull through the vertex without intravenous contrast.  COMPARISON:  None.  FINDINGS: The ventricles are normal in size and configuration. No extra-axial fluid collections are identified. The gray-white differentiation is normal. No CT findings for acute intracranial process such as hemorrhage or infarction. No mass lesions. The brainstem and cerebellum are grossly normal.  The bony structures are intact. The paranasal sinuses and mastoid air cells are  clear. The globes are intact.  IMPRESSION: No acute intracranial findings or mass lesion.   Electronically Signed   By: Marijo Sanes M.D.   On: 11/03/2014 17:57   Ct Angio Chest Pe W/cm &/or Wo Cm  11/03/2014   CLINICAL DATA:  Cardiac arrest.  EXAM: CT ANGIOGRAPHY CHEST WITH CONTRAST  TECHNIQUE: Multidetector CT imaging of the chest was performed using the standard protocol during bolus administration of intravenous contrast. Multiplanar CT image reconstructions and MIPs were obtained to evaluate the vascular anatomy.  CONTRAST:  130m OMNIPAQUE IOHEXOL 350 MG/ML SOLN  COMPARISON:  None.  FINDINGS: Chest wall: No significant findings.  Mediastinum: The heart is mildly enlarged but stable. No pericardial effusion. No mediastinal or hilar mass or adenopathy. The endotracheal tube is in good position. Small scattered lymph nodes are noted. The esophagus is grossly normal. The aorta is tortuous and demonstrates moderate calcifications but no focal aneurysm. There is reflux of contrast down the IVC and into the hepatic veins which could be due  to right heart failure or tricuspid regurgitation.  The pulmonary arterial tree is fairly well opacified. No filling defects to suggest pulmonary emboli.  Lungs/ pleura: Small bilateral pleural effusions and moderate bibasilar atelectasis. No pulmonary edema.  Upper abdomen:  No significant findings.  Review of the MIP images confirms the above findings.  IMPRESSION: 1. No definite CT findings for pulmonary embolism. 2. Mild cardiac enlargement but no pericardial effusion. 3. Reflux of contrast down the IVC likely due to right heart failure or tricuspid regurgitation. 4. Small bilateral pleural effusions and bibasilar atelectasis or infiltrates.   Electronically Signed   By: Marijo Sanes M.D.   On: 11/03/2014 18:27   Dg Chest Port 1 View  11/05/2014   CLINICAL DATA:  Intubation.  EXAM: PORTABLE CHEST - 1 VIEW  COMPARISON:  11/04/2014.  FINDINGS: Endotracheal tube, NG tube,  right IJ line in stable position. Cardiomegaly bilateral pulmonary interstitial prominence and pleural effusions consistent congestive heart failure. Similar findings noted on prior exam. No pneumothorax.  IMPRESSION: 1. Lines and tubes in stable position. 2. Persistent cardiomegaly with bilateral pulmonary interstitial infiltrates and pleural effusions suggesting congestive heart failure with pulmonary edema. Similar findings noted on prior exam.   Electronically Signed   By: [place]   On: 11/05/2014 07:35   Dg Chest Port 1 View  11/04/2014   CLINICAL DATA:  Pulmonary embolism  EXAM: PORTABLE CHEST - 1 VIEW  COMPARISON:  11/03/2014  FINDINGS: Endotracheal tube tip is 5.4 cm above the carina. Right jugular central line extends into the SVC. Nasogastric tube extends into the stomach. There is continued mild worsening of basilar airspace opacities and small effusions.  IMPRESSION: Support equipment appears satisfactorily positioned.  Continued mild worsening of basilar airspace opacities and small effusions   Electronically Signed   By: Andreas Newport M.D.   On: 11/04/2014 05:02   Dg Chest Port 1 View  11/03/2014   CLINICAL DATA:  Central line placement.  Initial encounter.  EXAM: PORTABLE CHEST - 1 VIEW  COMPARISON:  Chest radiograph performed 11/02/2014, and CTA of the chest performed earlier today at 5:46 p.m.  FINDINGS: [name] endotracheal tube is seen ending 5 cm above the carina. A right IJ line is noted ending about the mid SVC. An enteric tube is noted extending below the diaphragm.  Vascular congestion is noted, with bibasilar and perihilar airspace opacification, likely reflecting pulmonary edema. Small bilateral pleural effusions are seen. No pneumothorax is identified.  The cardiomediastinal silhouette is mildly enlarged. No acute osseous abnormalities are identified.  IMPRESSION: 1. Right IJ line noted ending about the mid SVC. 2. Endotracheal tube seen ending 5 cm above the  carina. 3. Vascular congestion and mild cardiomegaly, with bibasilar perihilar airspace opacification, likely reflecting pulmonary edema. This appears somewhat worsened from the prior chest radiograph. Small bilateral pleural effusions seen.   Electronically Signed   By: Garald Balding M.D.   On: 11/03/2014 19:45   Dg Abd Portable 1v  11/03/2014   CLINICAL DATA:  Nasogastric tube placement.  Initial encounter.  EXAM: PORTABLE ABDOMEN - 1 VIEW  COMPARISON:  CT of the abdomen and pelvis performed 02/12/2009  FINDINGS: [name] enteric tube is noted ending overlying the body of the stomach, with the side port noted about the level of the patient's known small hiatal hernia.  The visualized bowel gas pattern is grossly unremarkable. The lung bases are better characterized on concurrent chest radiograph. No acute osseous abnormalities are identified.  IMPRESSION: Enteric tube  noted ending overlying the body of the stomach, with the side port noted about the level of the patient's known small hiatal hernia.   Electronically Signed   By: Garald Balding M.D.   On: 11/03/2014 19:41    Assessment:  Principal Problem:   Cardiac arrest Active Problems:   HTN (hypertension)   Atrial fibrillation   CAP (community acquired pneumonia)   Cardiomyopathy   Hypokalemia   Acute on chronic combined systolic and diastolic HF (heart failure)   NSTEMI (non-ST elevated myocardial infarction)   SOB (shortness of breath)   Atrial fibrillation with RVR   PEA (Pulseless electrical activity)   Acute respiratory failure with hypoxemia   Anoxic brain injury   Essential hypertension   Cardiomyopathy, ischemic   Coronary artery disease due to lipid rich plaque   Plan:  1. Cardiac arrest - Ms. Scroggins had a brady-asystolic arrest. She underwent cardiac catheterization yesterday which revealed 80% LM stenosis and multivessel CAD. CT surgery was consulted and did not feel at present she was a CABG candidate - however,  are waiting to see how she recovers after weaning of sedation and extubation. Pacer seems necessary at this point, due to intermittent bradycardia. Continue heparin and aspirin for severe CAD. 2. A-fib with RVR - intermittent with elevated troponins. Suspect ischemic etiology. She is currently V-paced at 60 from a temporary pacing wire placed yesterday. On heparin, will need to continue. 3. Cardiomyopathy - clear ischemic etiology. LVEF 35-40% - CABG is indicated based on cath findings.  CVP this am was 12.  Would aim to keep CVP<15 - I's/O's indicate she is probably retaining fluid. May need to consider diuretics tomorrow. 4. Pneumonia - was on antibiotics - not currently 5. Hypertension - now on levophed due to relative hypotension in the setting of recent arrest - keep MAP >70 6. Possible anoxic brain injury - EEG planned for today.   CRITICAL CARE:  The patient is critically ill with multi-organ system failure and requires high complexity decision making for assessment and support, frequent evaluation and titration of therapies, application of advanced monitoring technologies and extensive interpretation of multiple databases.  Time Spent Directly with Patient:  45 minutes  Length of Stay:  LOS: 6 days   [name], MD, [facility] Attending Cardiologist [facility]  Joy Reiger C 11/05/2014, 8:36 AM

## 2014-11-05 NOTE — Progress Notes (Signed)
[place] Progress Note Patient Name: [name] [name] DOB: September 11, 1945 MRN: [id number]   Date of Service  11/05/2014  HPI/Events of Note  Non purposeful agitation post rewarm  eICU Interventions  precedex gtt Try to come off versed gtt     Intervention Category Major Interventions: Change in mental status - evaluation and management  Elyjah Hazan V. 11/05/2014, 11:54 PM

## 2014-11-05 NOTE — Progress Notes (Addendum)
ANTICOAGULATION CONSULT NOTE - Follow Up Consult  Pharmacy Consult for Heparin  Indication: atrial fibrillation/CAD  Allergies  Allergen Reactions  . Bee Venom Anaphylaxis  . Shrimp [Shellfish Allergy] Swelling    Patient Measurements: Height: 5' 7.5" (171.5 cm) Weight: 220 lb 14.4 oz (100.2 kg) IBW/kg (Calculated) : 62.75  Vital Signs: Temp: 98.4 F (36.9 C) (03/23 0700) Temp Source: Core (Comment) (03/23 0700) BP: 121/63 mmHg (03/23 0700) Pulse Rate: 59 (03/23 0700)  Labs:  Recent Labs  11/02/14 1349 11/05/14 0441 11-05-2014 1820  11-05-2014 2208 11/04/14 11/04/14 0355 11/04/14 0740  11/05/14 0001 11/05/14 0400 11/05/14 0805  HGB 14.3 14.7  --   < > 14.3  --   --  13.7  --   --  13.1  --   HCT 43.0 43.6  --   < > 42.0  --   --  40.6  --   --  39.0  --   PLT 126* 123*  --   --   --   --   --  154  --   --  110*  --   APTT  --   --  26  --   --   --  53*  --   --   --   --   --   LABPROT 14.1  --  15.1  --   --   --  14.9  --   --   --   --   --   INR 1.08  --  1.17  --   --   --  1.15  --   --   --   --   --   HEPARINUNFRC  --  0.29*  --   --   --   --  0.13*  --   --   --  0.41  --   CREATININE 1.24* 1.14* 1.49*  < > 1.10 1.06 1.10 1.13*  < > 1.02 1.04 1.09  TROPONINI  --   --  0.08*  --   --  0.25*  --  0.19*  --   --   --   --   < > = values in this interval not displayed.  Estimated Creatinine Clearance: 59.8 mL/min (by C-G formula based on Cr of 1.09).   Assessment: 69 year old female on IV heparin (apixaban on hold) for atrial fibrillation. Unable to tolerate cath on November 05, 2022, pt coded in cath lab.Initiated on [employer]. Severe bradycardia November 05, 2022 and patient was rewarmed to 36 degrees.   S/p cath 3/22 found to have multivessel CAD. Surgery feels taht she is currently not a surgical candidate give sedation and pressor requirements. Plan is to continue medical management and reassess as neuro function recovers and patient is more stable.   Will continue heparin now  for CAD and new afib. Patient does not appear to be in afib currently. Heparin is at goal on 1200 units/hr, no bleeding issues noted, hgb has remained stable although pltc is down today to 110. Will check confirmatory level at noon.  Goal of Therapy:  Heparin level 0.3-0.7 units/ml Monitor platelets by anticoagulation protocol: Yes   Plan:  -Continue heparin at 1200 units/hr -Check HL at noon -Daily CBC/HL -Monitor for bleeding  Erin Hearing PharmD., BCPS Clinical Pharmacist Pager [phone] 11/05/2014 9:55 AM   Addendum: repeat level still within therapeutic range. No rate adjustments needed. 11/05/2014 2:56 PM

## 2014-11-05 NOTE — Progress Notes (Signed)
PULMONARY / CRITICAL CARE MEDICINE   Name: [name] [name] MRN: [id number] DOB: [birth year]    ADMISSION DATE:  10/29/2014 CONSULTATION DATE:  11/03/2014  REFERRING MD :  Hartford Poli  CHIEF COMPLAINT:  Arrest  INITIAL PRESENTATION:   69 year old female with PMH as below, which is significant for HTN and asthma. She presented to [facility] ED 3/16 c/o chest tightness and weakness x 1 week. In ED she was found to be in new onset  AFib with RVR. Notably she recently had URI symptoms and had been taking OTC cold medications. She was started on dilt gtt and admitted. She was seen by cardiology on day of admission who recommended anticoagulation. Echo showed a new cardiomyopathy with EF 35-45% and diffuse hypokineses. Troponin was mildly elevated but was a flat trend and she was scheduled for LHC on 3/18, however she refused due to a reported IV dye allergy, when she got to the cath lab she became dyspneic which appeared to have an anxiety component and cath as pushed to the following Monday 3/21. She was taken for cath 3/21 but again developed acute onset SOB and tachypnea. She felt as though she could not breathe, however sats remained 100% on [place]. She progressed to have a cardiac arrest, which those present at the time likely started as a respiratory arrest. She underwent ACLS for 5-10 mins. PEA resolved with atropine and epi x 1. No purposeful movement.interaction post arrest. PCCM called 11/03/14    STUDIES and EVENS  Echo > LVEF 35-40 %, severe hypokinesis of inferolateral myocardium. Decreased diastolic compliance. PA press 23mm/Hg.  3/16 > admitted to [facility] with new onset Afib RVR and cardiomyopathy 3/18 > was taken for cath which was postponed due to anxiety and resp. distress.  3/21 > to cath lab, again developed SOB > cardiac arrest 10 mins > intubated . To ICU, hypothermia started CTA chest 3/21 > CT head 3/21 > no acute intracranial abnormality 3./22/16: Intolerant t hypothermia and converted to normothermia  protocol. Cath lab: 80% distal left main coronary artery stenosis With moderate diffuse left ventricular systolic dysfunction, ejection fraction estimated 35-40%. Right heart catheterization was not performed. Possible CABG candidate depending on current medical recovery    SUBJECTIVE/OVERNIGHT/INTERVAL HX 11/05/14: Being rewarmed. On levophed low dose. Off nimbex. Wil come off fent/versed gtt shortly. EEG abnormal 11/04/14   VITAL SIGNS: Temp:  [94.5 F (34.7 C)-98.2 F (36.8 C)] 98.2 F (36.8 C) (03/23 0600) Pulse Rate:  [36-62] 59 (03/23 0700) Resp:  [0-28] 28 (03/23 0344) BP: (98-162)/(46-87) 121/63 mmHg (03/23 0700) SpO2:  [90 %-100 %] 100 % (03/23 0700) Arterial Line BP: (116-178)/(53-87) 135/68 mmHg (03/23 0700) FiO2 (%):  [40 %-60 %] 40 % (03/23 0400) Weight:  [100.2 kg (220 lb 14.4 oz)] 100.2 kg (220 lb 14.4 oz) (03/23 0500) HEMODYNAMICS: CVP:  [8 mmHg-10 mmHg] 9 mmHg VENTILATOR SETTINGS: Vent Mode:  [-] PRVC FiO2 (%):  [40 %-60 %] 40 % Set Rate:  [20 bmp-28 bmp] 28 bmp Vt Set:  [500 mL] 500 mL PEEP:  [8 cmH20-10 cmH20] 8 cmH20 Plateau Pressure:  [19 cmH20-21 cmH20] 19 cmH20 INTAKE / OUTPUT:  Intake/Output Summary (Last 24 hours) at 11/05/14 0725 Last data filed at 11/05/14 0600  Gross per 24 hour  Intake 1994.38 ml  Output   1040 ml  Net 954.38 ml    PHYSICAL EXAMINATION: General:  Obese female in NAD on vent Neuro: RSS -4, sedated and paraluyzed HEENT:  [place]/AT, PERRL, no JVD noted Cardiovascular:  Irreg irreg, brady, no MRG Lungs: sync with vent Abdomen:  Soft, non-distended, + BS Musculoskeletal:  No acute deformity of ROM limitation Skin:  Grossly intact  LABS:  PULMONARY  Recent Labs Lab 11/03/14 1830 11/03/14 1956 11/03/14 2208 11/04/14 1045 11/04/14 1415  PHART 7.239*  --   --  7.277* 7.446  PCO2ART 46.6*  --   --  47.6* 27.6*  PO2ART 202.0*  --   --  144.0* 157.0*  HCO3 19.4*  --   --  21.5 18.7*  TCO2 20.9 18 18  23.0 19.5  O2SAT 98.6  --    --  98.3 98.9    CBC  Recent Labs Lab 11/03/14 0441  11/03/14 2208 11/04/14 0740 11/05/14 0400  HGB 14.7  < > 14.3 13.7 13.1  HCT 43.6  < > 42.0 40.6 39.0  WBC 12.3*  --   --  13.9* 13.1*  PLT 123*  --   --  154 110*  < > = values in this interval not displayed.  COAGULATION  Recent Labs Lab 10/29/14 1650 11/02/14 1349 11/03/14 1820 11/04/14 0355  INR 0.96 1.08 1.17 1.15    CARDIAC    Recent Labs Lab 10/30/14 1820 10/31/14 0149 11/03/14 1820 11/04/14 11/04/14 0740  TROPONINI 0.07* 0.07* 0.08* 0.25* 0.19*   No results for input(s): PROBNP in the last 168 hours.   CHEMISTRY  Recent Labs Lab 10/29/14 1650  11/04/14 0740 11/04/14 1630 11/04/14 2015 11/05/14 0001 11/05/14 0400  NA 139  < > 142 141 141 141 141  K 3.1*  < > 4.0 3.3* 3.6 3.5 3.4*  CL 106  < > 112 117* 116* 117* 115*  CO2 21  < > 25 19 18* 20 19  GLUCOSE 108*  < > 150* 91 89 103* 104*  BUN 17  < > 19 20 21 18 15   CREATININE 1.03  < > 1.13* 1.15* 1.06 1.02 1.04  CALCIUM 9.8  < > 8.2* 8.4 8.6 8.5 8.3*  MG 1.9  --  2.2  --   --   --  1.7  PHOS 4.0  --  4.7*  --   --   --  2.5  < > = values in this interval not displayed. Estimated Creatinine Clearance: 62.7 mL/min (by C-G formula based on Cr of 1.04).   LIVER  Recent Labs Lab 10/29/14 1650 10/30/14 0445 10/31/14 0555 11/01/14 0425 11/02/14 0831 11/02/14 1349 11/03/14 0441 11/03/14 1820 11/04/14 0355  AST  --  17 16 19 17   --  18  --   --   ALT  --  14 14 15 19   --  21  --   --   ALKPHOS  --  97 100 96 89  --  90  --   --   BILITOT  --  0.8 0.9 0.6 0.5  --  0.9  --   --   PROT  --  6.9 7.1 7.8 7.4  --  6.7  --   --   ALBUMIN  --  3.5 3.4* 3.6 3.5  --  3.6  --   --   INR 0.96  --   --   --   --  1.08  --  1.17 1.15     INFECTIOUS  Recent Labs Lab 11/04/14 0355  LATICACIDVEN 1.4     ENDOCRINE CBG (last 3)   Recent Labs  11/04/14 2015 11/05/14 0010 11/05/14 0350  GLUCAP 88 89 117*  IMAGING  x48h Ct Head Wo Contrast  11/03/2014   CLINICAL DATA:  Status post cardiac arrest.  EXAM: CT HEAD WITHOUT CONTRAST  TECHNIQUE: Contiguous axial images were obtained from the base of the skull through the vertex without intravenous contrast.  COMPARISON:  None.  FINDINGS: The ventricles are normal in size and configuration. No extra-axial fluid collections are identified. The gray-white differentiation is normal. No CT findings for acute intracranial process such as hemorrhage or infarction. No mass lesions. The brainstem and cerebellum are grossly normal.  The bony structures are intact. The paranasal sinuses and mastoid air cells are clear. The globes are intact.  IMPRESSION: No acute intracranial findings or mass lesion.   Electronically Signed   By: Marijo Sanes M.D.   On: 11/03/2014 17:57   Ct Angio Chest Pe W/cm &/or Wo Cm  11/03/2014   CLINICAL DATA:  Cardiac arrest.  EXAM: CT ANGIOGRAPHY CHEST WITH CONTRAST  TECHNIQUE: Multidetector CT imaging of the chest was performed using the standard protocol during bolus administration of intravenous contrast. Multiplanar CT image reconstructions and MIPs were obtained to evaluate the vascular anatomy.  CONTRAST:  153mL OMNIPAQUE IOHEXOL 350 MG/ML SOLN  COMPARISON:  None.  FINDINGS: Chest wall: No significant findings.  Mediastinum: The heart is mildly enlarged but stable. No pericardial effusion. No mediastinal or hilar mass or adenopathy. The endotracheal tube is in good position. Small scattered lymph nodes are noted. The esophagus is grossly normal. The aorta is tortuous and demonstrates moderate calcifications but no focal aneurysm. There is reflux of contrast down the IVC and into the hepatic veins which could be due to right heart failure or tricuspid regurgitation.  The pulmonary arterial tree is fairly well opacified. No filling defects to suggest pulmonary emboli.  Lungs/ pleura: Small bilateral pleural effusions and moderate bibasilar atelectasis. No  pulmonary edema.  Upper abdomen:  No significant findings.  Review of the MIP images confirms the above findings.  IMPRESSION: 1. No definite CT findings for pulmonary embolism. 2. Mild cardiac enlargement but no pericardial effusion. 3. Reflux of contrast down the IVC likely due to right heart failure or tricuspid regurgitation. 4. Small bilateral pleural effusions and bibasilar atelectasis or infiltrates.   Electronically Signed   By: Marijo Sanes M.D.   On: 11/03/2014 18:27   Dg Chest Port 1 View  11/04/2014   CLINICAL DATA:  Pulmonary embolism  EXAM: PORTABLE CHEST - 1 VIEW  COMPARISON:  11/03/2014  FINDINGS: Endotracheal tube tip is 5.4 cm above the carina. Right jugular central line extends into the SVC. Nasogastric tube extends into the stomach. There is continued mild worsening of basilar airspace opacities and small effusions.  IMPRESSION: Support equipment appears satisfactorily positioned.  Continued mild worsening of basilar airspace opacities and small effusions   Electronically Signed   By: Andreas Newport M.D.   On: 11/04/2014 05:02   Dg Chest Port 1 View  11/03/2014   CLINICAL DATA:  Central line placement.  Initial encounter.  EXAM: PORTABLE CHEST - 1 VIEW  COMPARISON:  Chest radiograph performed 11/02/2014, and CTA of the chest performed earlier today at 5:46 p.m.  FINDINGS: [name] endotracheal tube is seen ending 5 cm above the carina. A right IJ line is noted ending about the mid SVC. An enteric tube is noted extending below the diaphragm.  Vascular congestion is noted, with bibasilar and perihilar airspace opacification, likely reflecting pulmonary edema. Small bilateral pleural effusions are seen. No pneumothorax is identified.  The cardiomediastinal silhouette  is mildly enlarged. No acute osseous abnormalities are identified.  IMPRESSION: 1. Right IJ line noted ending about the mid SVC. 2. Endotracheal tube seen ending 5 cm above the carina. 3. Vascular congestion and mild  cardiomegaly, with bibasilar perihilar airspace opacification, likely reflecting pulmonary edema. This appears somewhat worsened from the prior chest radiograph. Small bilateral pleural effusions seen.   Electronically Signed   By: Garald Balding M.D.   On: 11/03/2014 19:45   Dg Abd Portable 1v  11/03/2014   CLINICAL DATA:  Nasogastric tube placement.  Initial encounter.  EXAM: PORTABLE ABDOMEN - 1 VIEW  COMPARISON:  CT of the abdomen and pelvis performed 02/12/2009  FINDINGS: [name] enteric tube is noted ending overlying the body of the stomach, with the side port noted about the level of the patient's known small hiatal hernia.  The visualized bowel gas pattern is grossly unremarkable. The lung bases are better characterized on concurrent chest radiograph. No acute osseous abnormalities are identified.  IMPRESSION: Enteric tube noted ending overlying the body of the stomach, with the side port noted about the level of the patient's known small hiatal hernia.   Electronically Signed   By: Garald Balding M.D.   On: 11/03/2014 19:41       ASSESSMENT / PLAN:  PULMONARY OETT 3/21 >>> A: Acute respiratory failure s/p cardiac arrest  P:   Full vent support with PC CXR for tube placement Follow ABGs VAP bundle  CARDIOVASCULAR CVL 3/21 >>> A:  Cardiac arrest,   - Cardiomyopathy LVEF 35-45% severe HK inferior, diffuse HK  - New Onset AF RVR  - Cath with severe CAD 11/04/14    - On levophed 4 . Got brady with hypothermia  P:  Levophed for MAP goal PRN hydralazine to keep SBP < 186mm/Hg CVP monitoring Repeat Echo 3/22 Rx therapeutic normothermia Holding amio in setting bradycardia Holding norvasc   RENAL A:   AKI  - normal creat 11/05/2014, K low but getting rewarmed   P:   Strict I&O Follow Bmet Replace electrolytes as indicated  GASTROINTESTINAL A:   No acute issues  P:   NPO PPI  HEMATOLOGIC A:   No acute issues  P:  Follow cbc Heparin gtt for AF per  pharmacy  INFECTIOUS No results for input(s): PROCALCITON in the last 168 hours.  A:   ? CAP - ABX stopped by primary  P:   Monitor clinically  ENDOCRINE A:   No acute issues  P:   Follow glucose on chemistry Add SSI if consistently greater than 180.  NEUROLOGIC A:   Acute metabolic encephalopathy Consider PRES v anoxia    - being rewarmed  P:   RASS goal: -5 but will be liberated soon Dc nimbex Wean off fent/versed gtt per protocol Might need diprivan Check neuron specific enolase  FAMILY  - Updates: none family at bedside 11/05/2014  - Inter-disciplinary family meet or Palliative Care meeting due by:  3/28   The patient is critically ill with multiple organ systems failure and requires high complexity decision making for assessment and support, frequent evaluation and titration of therapies, application of advanced monitoring technologies and extensive interpretation of multiple databases.   Critical Care Time devoted to patient care services described in this note is  30  Minutes. This time reflects time of care of this signee Dr Brand Males. This critical care time does not reflect procedure time, or teaching time or supervisory time of PA/NP/Med student/Med Resident etc but could  involve care discussion time    Dr. Brand Males, M.D., [facility].C.P Pulmonary and Critical Care Medicine Staff Physician [facility] Pager: [phone], If no answer or between  15:00h - 7:00h: call [phone]  11/05/2014 7:25 AM

## 2014-11-05 NOTE — Progress Notes (Signed)
Nimbex turned off at 0500. Fentanyl and Versed drips turned in half two hours later, and then OFF. Patient opens eyes to voice and pain, but does not make eye contact, track, or follow commands at this time. Medications will remain off at this time. Will continue to monitor and assess.   Roxan Hockey, RN

## 2014-11-05 NOTE — Progress Notes (Signed)
Patient became very agitated after suctioning. Patient fighting ventilator. Trying to sit up, however not following demands.  HR elevated 150-180's. Fentanyl and versed drips turned back on.

## 2014-11-05 NOTE — Progress Notes (Signed)
Levo turned OFF. BP stable, Current Bp  145/71 (90).   Roxan Hockey, RN

## 2014-11-05 NOTE — Progress Notes (Signed)
Reginal Lutes MD Titus Mould) regarding sedation and temperature management. Informed MD  Pt remains agitated when suctioned or turned. MP increases to 200's/100's. MD advised to keep pt normothermic until 8pm on 3/24. MD also advised to maintain current sedation orders until that normothermia time at 8pm on 3/24 is met. Will continue to monitor pt.

## 2014-11-06 ENCOUNTER — Inpatient Hospital Stay (HOSPITAL_COMMUNITY): Payer: Commercial Managed Care - HMO

## 2014-11-06 DIAGNOSIS — E876 Hypokalemia: Secondary | ICD-10-CM

## 2014-11-06 LAB — CBC WITH DIFFERENTIAL/PLATELET
Basophils Absolute: 0 10*3/uL (ref 0.0–0.1)
Basophils Relative: 0 % (ref 0–1)
Eosinophils Absolute: 0 10*3/uL (ref 0.0–0.7)
Eosinophils Relative: 0 % (ref 0–5)
HCT: 37.2 % (ref 36.0–46.0)
Hemoglobin: 12.4 g/dL (ref 12.0–15.0)
Lymphocytes Relative: 13 % (ref 12–46)
Lymphs Abs: 1.4 10*3/uL (ref 0.7–4.0)
MCH: 29 pg (ref 26.0–34.0)
MCHC: 33.3 g/dL (ref 30.0–36.0)
MCV: 87.1 fL (ref 78.0–100.0)
Monocytes Absolute: 0.9 10*3/uL (ref 0.1–1.0)
Monocytes Relative: 8 % (ref 3–12)
Neutro Abs: 8.5 10*3/uL — ABNORMAL HIGH (ref 1.7–7.7)
Neutrophils Relative %: 79 % — ABNORMAL HIGH (ref 43–77)
Platelets: 91 10*3/uL — ABNORMAL LOW (ref 150–400)
RBC: 4.27 MIL/uL (ref 3.87–5.11)
RDW: 16.4 % — ABNORMAL HIGH (ref 11.5–15.5)
WBC: 10.7 10*3/uL — ABNORMAL HIGH (ref 4.0–10.5)

## 2014-11-06 LAB — BASIC METABOLIC PANEL
Anion gap: 8 (ref 5–15)
Anion gap: 5 (ref 5–15)
BUN: 19 mg/dL (ref 6–23)
BUN: 18 mg/dL (ref 6–23)
CO2: 16 mmol/L — ABNORMAL LOW (ref 19–32)
CO2: 21 mmol/L (ref 19–32)
Calcium: 8.4 mg/dL (ref 8.4–10.5)
Calcium: 8.5 mg/dL (ref 8.4–10.5)
Chloride: 116 mmol/L — ABNORMAL HIGH (ref 96–112)
Chloride: 117 mmol/L — ABNORMAL HIGH (ref 96–112)
Creatinine, Ser: 1.1 mg/dL (ref 0.50–1.10)
Creatinine, Ser: 1.12 mg/dL — ABNORMAL HIGH (ref 0.50–1.10)
GFR calc Af Amer: 58 mL/min — ABNORMAL LOW (ref >90)
GFR calc Af Amer: 57 mL/min — ABNORMAL LOW (ref >90)
GFR calc non Af Amer: 50 mL/min — ABNORMAL LOW (ref >90)
GFR calc non Af Amer: 49 mL/min — ABNORMAL LOW (ref >90)
Glucose, Bld: 111 mg/dL — ABNORMAL HIGH (ref 70–99)
Glucose, Bld: 124 mg/dL — ABNORMAL HIGH (ref 70–99)
Potassium: 3 mmol/L — ABNORMAL LOW (ref 3.5–5.1)
Potassium: 3.5 mmol/L (ref 3.5–5.1)
Sodium: 140 mmol/L (ref 135–145)
Sodium: 143 mmol/L (ref 135–145)

## 2014-11-06 LAB — PHOSPHORUS: Phosphorus: 3.5 mg/dL (ref 2.3–4.6)

## 2014-11-06 LAB — GLUCOSE, CAPILLARY
Glucose-Capillary: 103 mg/dL — ABNORMAL HIGH (ref 70–99)
Glucose-Capillary: 117 mg/dL — ABNORMAL HIGH (ref 70–99)
Glucose-Capillary: 115 mg/dL — ABNORMAL HIGH (ref 70–99)
Glucose-Capillary: 113 mg/dL — ABNORMAL HIGH (ref 70–99)
Glucose-Capillary: 116 mg/dL — ABNORMAL HIGH (ref 70–99)
Glucose-Capillary: 114 mg/dL — ABNORMAL HIGH (ref 70–99)

## 2014-11-06 LAB — MAGNESIUM
Magnesium: 1.8 mg/dL (ref 1.5–2.5)
Magnesium: 2.1 mg/dL (ref 1.5–2.5)

## 2014-11-06 LAB — HEPARIN LEVEL (UNFRACTIONATED): Heparin Unfractionated: 0.38 IU/mL (ref 0.30–0.70)

## 2014-11-06 MED ORDER — FUROSEMIDE 10 MG/ML IJ SOLN
40.0000 mg | Freq: Once | INTRAMUSCULAR | Status: AC
  Administered 2014-11-06: 40 mg via INTRAVENOUS
  Filled 2014-11-06: qty 4

## 2014-11-06 MED ORDER — VITAL HIGH PROTEIN PO LIQD
1000.0000 mL | ORAL | Status: DC
  Administered 2014-11-06: 1000 mL
  Filled 2014-11-06 (×4): qty 1000

## 2014-11-06 MED ORDER — MIDAZOLAM HCL 2 MG/2ML IJ SOLN
2.0000 mg | INTRAMUSCULAR | Status: DC | PRN
  Administered 2014-11-06 – 2014-11-07 (×4): 2 mg via INTRAVENOUS
  Filled 2014-11-06 (×2): qty 2
  Filled 2014-11-06: qty 4
  Filled 2014-11-06: qty 2

## 2014-11-06 MED ORDER — MAGNESIUM SULFATE 2 GM/50ML IV SOLN
2.0000 g | Freq: Once | INTRAVENOUS | Status: AC
  Administered 2014-11-06: 2 g via INTRAVENOUS
  Filled 2014-11-06: qty 50

## 2014-11-06 MED ORDER — MIDAZOLAM HCL 2 MG/2ML IJ SOLN
INTRAMUSCULAR | Status: AC
  Administered 2014-11-06: 4 mg
  Filled 2014-11-06: qty 4

## 2014-11-06 MED ORDER — PRO-STAT SUGAR FREE PO LIQD
30.0000 mL | Freq: Three times a day (TID) | ORAL | Status: DC
  Administered 2014-11-06 – 2014-11-07 (×3): 30 mL
  Filled 2014-11-06 (×6): qty 30

## 2014-11-06 MED ORDER — FENTANYL CITRATE 0.05 MG/ML IJ SOLN
INTRAMUSCULAR | Status: AC
  Administered 2014-11-06: 100 ug
  Filled 2014-11-06: qty 2

## 2014-11-06 MED ORDER — POTASSIUM CHLORIDE 20 MEQ/15ML (10%) PO SOLN
40.0000 meq | ORAL | Status: AC
  Administered 2014-11-06 (×2): 40 meq
  Filled 2014-11-06 (×2): qty 30

## 2014-11-06 MED ORDER — DEXMEDETOMIDINE HCL IN NACL 400 MCG/100ML IV SOLN
0.4000 ug/kg/h | INTRAVENOUS | Status: DC
  Administered 2014-11-07: 0.6 ug/kg/h via INTRAVENOUS
  Administered 2014-11-07: 0.7 ug/kg/h via INTRAVENOUS
  Administered 2014-11-07: 0.4 ug/kg/h via INTRAVENOUS
  Filled 2014-11-06 (×3): qty 100

## 2014-11-06 MED ORDER — FENTANYL BOLUS VIA INFUSION
50.0000 ug | INTRAVENOUS | Status: DC | PRN
  Filled 2014-11-06: qty 200

## 2014-11-06 MED ORDER — FENTANYL CITRATE 0.05 MG/ML IJ SOLN
100.0000 ug | INTRAMUSCULAR | Status: DC | PRN
  Administered 2014-11-06 – 2014-11-07 (×3): 100 ug via INTRAVENOUS
  Administered 2014-11-07: 50 ug via INTRAVENOUS
  Administered 2014-11-07: 0.25 ug via INTRAVENOUS
  Filled 2014-11-06 (×5): qty 2

## 2014-11-06 NOTE — Progress Notes (Signed)
[place] Progress Note Patient Name: [name] [name] DOB: July 08, 1946 MRN: [id number]   Date of Service  11/06/2014  HPI/Events of Note  Appears to be overventilated @ 14 lpm and "riding" the vent  eICU Interventions  Vent rate decreased from 28 to 16/min     Intervention Category Major Interventions: Respiratory failure - evaluation and management  Merton Border 11/06/2014, 7:29 PM

## 2014-11-06 NOTE — Progress Notes (Signed)
RN had called RT to patient room.  Patient had become awake and pulled ETT out to 18 at the lip.  Advanced ETT back to 24 at the lip.  Bilateral breath sounds noted.  Will continue to monitor.

## 2014-11-06 NOTE — Progress Notes (Signed)
INITIAL NUTRITION ASSESSMENT  DOCUMENTATION CODES Per approved criteria  -Obesity Unspecified   INTERVENTION: Initiate Vital High Protein @ 20 ml/hr via OG tube and increase by 10 ml every 4 hours to goal rate of 40 ml/hr.   30 ml Prostat TID.    Tube feeding regimen provides 1260 kcal (12.8 kcal/kg of actual weight), 129 grams of protein, and 802 ml of H2O.   NUTRITION DIAGNOSIS: Inadequate oral intake related to inability to eat as evidenced by NPO status  Goal: Enteral nutrition to provide 65-70% of estimated calorie needs based on ASPEN guidelines for hypocaloric, high protein feeding in critically ill obese individuals  Monitor:  Vent status, TF initiation, weight trends, labs  Reason for Assessment: Consult received to initiate and manage enteral nutrition support.  69 y.o. female  Admitting Dx: Cardiac arrest  ASSESSMENT: Pt admitted with new onset AFib with RVR. Pt scheduled for LHC on 3/18 but refused, rescheduled for 3/21 pt had SOB and progressed to cardiac arrest and intubated. Pt not able to tolerate hypothermia protocol and switched to normotherapy.   Patient is currently intubated on ventilator support MV: 12.8 L/min Temp (24hrs), Avg:98.5 F (36.9 C), Min:97.2 F (36.2 C), Max:99.3 F (37.4 C)  Labs reviewed: Potassium low on replacement.  No sign of fat or muscle depletion  Height: Ht Readings from Last 1 Encounters:  10/29/14 5' 7.5" (1.715 m)    Weight: Wt Readings from Last 1 Encounters:  11/06/14 223 lb 1.7 oz (101.2 kg)  Admission weight: 215 lb (97.7 kg) 3/16 Admission BMI: 33.2 (obesity class I)  Ideal Body Weight: 62.5 kg   % Ideal Body Weight: 156%  Wt Readings from Last 10 Encounters:  11/06/14 223 lb 1.7 oz (101.2 kg)  10/09/14 214 lb 3.2 oz (97.16 kg)  09/18/14 215 lb (97.523 kg)  12/25/13 211 lb 12.8 oz (96.072 kg)  10/25/12 216 lb (97.977 kg)  04/03/12 215 lb 9.6 oz (97.796 kg)  03/17/12 214 lb (97.07 kg)  09/16/11 210 lb  (95.255 kg)    Usual Body Weight: 215 lb   % Usual Body Weight: 100%  BMI:  Body mass index is 34.41 kg/(m^2).  Estimated Nutritional Needs: Kcal: 7654-6503 Protein: >/= 125 grams Fluid: > 1.5 L/day  Skin: intact  Diet Order:    EDUCATION NEEDS: -No education needs identified at this time   Intake/Output Summary (Last 24 hours) at 11/06/14 1112 Last data filed at 11/06/14 0800  Gross per 24 hour  Intake 1834.12 ml  Output    935 ml  Net 899.12 ml    Last BM: 3/21   Labs:   Recent Labs Lab 11/04/14 0740  11/05/14 0400 11/05/14 0805 11/05/14 1538 11/06/14 0400  NA 142  < > 141 143 142 140  K 4.0  < > 3.4* 3.1* 3.4* 3.0*  CL 112  < > 115* 119* 116* 116*  CO2 25  < > 19 20 22  16*  BUN 19  < > 15 15 20 19   CREATININE 1.13*  < > 1.04 1.09 1.21* 1.10  CALCIUM 8.2*  < > 8.3* 7.8* 8.6 8.4  MG 2.2  --  1.7  --   --  1.8  PHOS 4.7*  --  2.5  --   --  3.5  GLUCOSE 150*  < > 104* 95 119* 111*  < > = values in this interval not displayed.  CBG (last 3)   Recent Labs  11/06/14 0121 11/06/14 0425 11/06/14 0727  GLUCAP  103* 117* 115*    Scheduled Meds: . antiseptic oral rinse  7 mL Mouth Rinse QID  . artificial tears  1 application Both Eyes 3 times per day  . aspirin EC  81 mg Oral Daily  . atorvastatin  80 mg Oral q1800  . chlorhexidine  15 mL Mouth Rinse BID  . docusate  100 mg Oral Daily  . insulin aspart  0-15 Units Subcutaneous 6 times per day  . magnesium sulfate 1 - 4 g bolus IVPB  2 g Intravenous Once  . pantoprazole (PROTONIX) IV  40 mg Intravenous Q24H    Continuous Infusions: . sodium chloride 50 mL/hr at 11/05/14 1900  . dexmedetomidine 0.4 mcg/kg/hr (11/06/14 4193)  . heparin 1,200 Units/hr (11/06/14 0552)   [place], [place], [place] Pager [phone] After Hours Pager

## 2014-11-06 NOTE — Progress Notes (Signed)
Dr Philbert Riser came by to see pt and was pacing 100% at this time awaiting for bmet and mg.

## 2014-11-06 NOTE — Progress Notes (Addendum)
At [place] performed mouth care. Pt became very agitated, HR went to 140's to 150's. BP increased to 200's/100's (reached 300's/150's shortly). This agitation and increased HR/BP lasted from about 0020 to 0047. Pt unable to follow commands but sitting up in bed and kicking legs out of bed. Had Elink camera in to see pt status. Precedex gtt was started at [place]. Once pt stabilized, RN able to wean versed and fentanyl off at 0125. Will continue to monitor.

## 2014-11-06 NOTE — Progress Notes (Signed)
PULMONARY / CRITICAL CARE MEDICINE   Name: [name] [name] MRN: [id number] DOB: 1946/04/10    ADMISSION DATE:  10/29/2014 CONSULTATION DATE:  11/03/2014  REFERRING MD :  Hartford Poli  CHIEF COMPLAINT:  Arrest  INITIAL PRESENTATION:   69 year old female with PMH as below, which is significant for HTN and asthma. She presented to [facility] ED 3/16 c/o chest tightness and weakness x 1 week. In ED she was found to be in new onset  AFib with RVR. Notably she recently had URI symptoms and had been taking OTC cold medications. She was started on dilt gtt and admitted. She was seen by cardiology on day of admission who recommended anticoagulation. Echo showed a new cardiomyopathy with EF 35-45% and diffuse hypokineses. Troponin was mildly elevated but was a flat trend and she was scheduled for LHC on 3/18, however she refused due to a reported IV dye allergy, when she got to the cath lab she became dyspneic which appeared to have an anxiety component and cath as pushed to the following Monday 3/21. She was taken for cath 3/21 but again developed acute onset SOB and tachypnea. She felt as though she could not breathe, however sats remained 100% on [place]. She progressed to have a cardiac arrest, which those present at the time likely started as a respiratory arrest. She underwent ACLS for 5-10 mins. PEA resolved with atropine and epi x 1. No purposeful movement.interaction post arrest. PCCM called 11/03/14    STUDIES and EVENS  Echo > LVEF 35-40 %, severe hypokinesis of inferolateral myocardium. Decreased diastolic compliance. PA press 70mm/Hg.  3/16 > admitted to [facility] with new onset Afib RVR and cardiomyopathy 3/18 > was taken for cath which was postponed due to anxiety and resp. distress.  3/21 > to cath lab, again developed SOB > cardiac arrest 10 mins > intubated . To ICU, hypothermia started CTA chest 3/21 > CT head 3/21 > no acute intracranial abnormality 3./22/16: Intolerant t hypothermia and converted to normothermia  protocol. Cath lab: 80% distal left main coronary artery stenosis With moderate diffuse left ventricular systolic dysfunction, ejection fraction estimated 35-40%. Right heart catheterization was not performed. Possible CABG candidate depending on current medical recovery 11/05/14: Being rewarmed. On levophed low dose. Off nimbex. Wil come off fent/versed gtt shortly. EEG abnormal 11/04/14   SUBJECTIVE/OVERNIGHT/INTERVAL HX 11/06/14: daughter from [place] on way. Other family memmbers - intra-family tension +. Off fent/versed x 12h - on precedex gtt only - non purposeful agitation +. Will come off normothermia pads at 8pm today   VITAL SIGNS: Temp:  [97.2 F (36.2 C)-99.3 F (37.4 C)] 98.1 F (36.7 C) (03/24 0800) Pulse Rate:  [48-116] 59 (03/24 0800) Resp:  [28] 28 (03/24 0800) BP: (94-178)/(47-117) 113/59 mmHg (03/24 0800) SpO2:  [91 %-100 %] 99 % (03/24 0800) Arterial Line BP: (107-333)/(61-160) 122/66 mmHg (03/24 0800) FiO2 (%):  [40 %] 40 % (03/24 0800) Weight:  [101.2 kg (223 lb 1.7 oz)] 101.2 kg (223 lb 1.7 oz) (03/24 0500) HEMODYNAMICS: CVP:  [11 mmHg-15 mmHg] 12 mmHg VENTILATOR SETTINGS: Vent Mode:  [-] PRVC FiO2 (%):  [40 %] 40 % Set Rate:  [28 bmp] 28 bmp Vt Set:  [500 mL] 500 mL PEEP:  [5 cmH20] 5 cmH20 Plateau Pressure:  [17 cmH20-22 cmH20] 17 cmH20 INTAKE / OUTPUT:  Intake/Output Summary (Last 24 hours) at 11/06/14 0934 Last data filed at 11/06/14 0800  Gross per 24 hour  Intake 1892.37 ml  Output    935 ml  Net 957.37 ml    PHYSICAL EXAMINATION: General:  Obese female in NAD on vent Neuro: RSS -4, on precedex HEENT:  [place]/AT, PERRL, no JVD noted Cardiovascular:  Irreg irreg, brady, no MRG Lungs: sync with vent Abdomen:  Soft, non-distended, + BS Musculoskeletal:  No acute deformity of ROM limitation Skin:  Grossly intact  LABS:  PULMONARY  Recent Labs Lab 11/03/14 1830 11/03/14 1956 11/03/14 2208 11/04/14 1045 11/04/14 1415  PHART 7.239*  --   --   7.277* 7.446  PCO2ART 46.6*  --   --  47.6* 27.6*  PO2ART 202.0*  --   --  144.0* 157.0*  HCO3 19.4*  --   --  21.5 18.7*  TCO2 20.9 18 18  23.0 19.5  O2SAT 98.6  --   --  98.3 98.9    CBC  Recent Labs Lab 11/04/14 0740 11/05/14 0400 11/06/14 0400  HGB 13.7 13.1 12.4  HCT 40.6 39.0 37.2  WBC 13.9* 13.1* 10.7*  PLT 154 110* 91*    COAGULATION  Recent Labs Lab 11/02/14 1349 11/03/14 1820 11/04/14 0355  INR 1.08 1.17 1.15    CARDIAC    Recent Labs Lab 10/30/14 1820 10/31/14 0149 11/03/14 1820 11/04/14 11/04/14 0740  TROPONINI 0.07* 0.07* 0.08* 0.25* 0.19*   No results for input(s): PROBNP in the last 168 hours.   CHEMISTRY  Recent Labs Lab 11/04/14 0740  11/05/14 0001 11/05/14 0400 11/05/14 0805 11/05/14 1538 11/06/14 0400  NA 142  < > 141 141 143 142 140  K 4.0  < > 3.5 3.4* 3.1* 3.4* 3.0*  CL 112  < > 117* 115* 119* 116* 116*  CO2 25  < > 20 19 20 22  16*  GLUCOSE 150*  < > 103* 104* 95 119* 111*  BUN 19  < > 18 15 15 20 19   CREATININE 1.13*  < > 1.02 1.04 1.09 1.21* 1.10  CALCIUM 8.2*  < > 8.5 8.3* 7.8* 8.6 8.4  MG 2.2  --   --  1.7  --   --  1.8  PHOS 4.7*  --   --  2.5  --   --  3.5  < > = values in this interval not displayed. Estimated Creatinine Clearance: 59.6 mL/min (by C-G formula based on Cr of 1.1).   LIVER  Recent Labs Lab 10/31/14 0555 11/01/14 0425 11/02/14 0831 11/02/14 1349 11/03/14 0441 11/03/14 1820 11/04/14 0355  AST 16 19 17   --  18  --   --   ALT 14 15 19   --  21  --   --   ALKPHOS 100 96 89  --  90  --   --   BILITOT 0.9 0.6 0.5  --  0.9  --   --   PROT 7.1 7.8 7.4  --  6.7  --   --   ALBUMIN 3.4* 3.6 3.5  --  3.6  --   --   INR  --   --   --  1.08  --  1.17 1.15     INFECTIOUS  Recent Labs Lab 11/04/14 0355  LATICACIDVEN 1.4     ENDOCRINE CBG (last 3)   Recent Labs  11/06/14 0121 11/06/14 0425 11/06/14 0727  GLUCAP 103* 117* 115*         IMAGING x48h Dg Chest Port 1  View  11/05/2014   CLINICAL DATA:  Intubation.  EXAM: PORTABLE CHEST - 1 VIEW  COMPARISON:  11/04/2014.  FINDINGS: Endotracheal tube, NG  tube, right IJ line in stable position. Cardiomegaly bilateral pulmonary interstitial prominence and pleural effusions consistent congestive heart failure. Similar findings noted on prior exam. No pneumothorax.  IMPRESSION: 1. Lines and tubes in stable position. 2. Persistent cardiomegaly with bilateral pulmonary interstitial infiltrates and pleural effusions suggesting congestive heart failure with pulmonary edema. Similar findings noted on prior exam.   Electronically Signed   By: [place]   On: 11/05/2014 07:35       ASSESSMENT / PLAN:  PULMONARY OETT 3/21 >>> A: Acute respiratory failure s/p cardiac arrest   - does not meet sbt criteria due to encephalopathy  P:   Full vent support with PC CXR for tube placement Follow ABGs VAP bundle  CARDIOVASCULAR CVL 3/21 >>> A:  Cardiac arrest,   - Cardiomyopathy LVEF 35-45% severe HK inferior, diffuse HK  - New Onset AF RVR  - Cath with severe CAD 11/04/14    - Off levophed as of 11/05/14   P:  Cards following PRN hydralazine to keep SBP < 128mm/Hg CVP monitoring Holding amio in setting bradycardia Holding norvasc   RENAL A:   AKI -normal creat 11/05/2014,    K low and mag low  P:   Replete mag and K Strict I&O Follow Bmet Replace electrolytes as indicated  GASTROINTESTINAL A:   No acute issues  P:   NPO PPI Start tube feeds  11/06/14  HEMATOLOGIC A:   No acute issues  P:  Follow cbc Heparin gtt for AF per pharmacy  INFECTIOUS  A:   ? CAP - ABX stopped by primary  P:   Monitor clinically  ENDOCRINE A:   No acute issues  P:   Follow glucose on chemistry Add SSI if consistently greater than 180.  NEUROLOGIC A:   Acute metabolic encephalopathy Consider PRES v anoxia    -11/06/2014 : 24 after rewarm and 12h off fent/versed - non purposeful agitation  +, controlled on precedex. No myoclonus. EEG abnormal but done under sedation   P:   Precedex gtt + fent prn Dc fent and versed gtt from [facility] Check neuron specific enolase 11/06/14  If unimproved consider neuro consult 11/07/14   FAMILY  - Updates: a brother, and a woman named Michae Kava - half sister of DPOA daughter from [place] . Theree is some intra-family tension. They prefer waiting for daughter from [place] to show up for updates  - Inter-disciplinary family meet or Palliative Care meeting due by:  3/28   The patient is critically ill with multiple organ systems failure and requires high complexity decision making for assessment and support, frequent evaluation and titration of therapies, application of advanced monitoring technologies and extensive interpretation of multiple databases.   Critical Care Time devoted to patient care services described in this note is  30  Minutes. This time reflects time of care of this signee Dr Brand Males. This critical care time does not reflect procedure time, or teaching time or supervisory time of PA/NP/Med student/Med Resident etc but could involve care discussion time    Dr. Brand Males, M.D., [facility].C.P Pulmonary and Critical Care Medicine Staff Physician [place] [facility] Pager: [phone], If no answer or between  15:00h - 7:00h: call [phone]  11/06/2014 9:34 AM

## 2014-11-06 NOTE — Progress Notes (Signed)
Agitated.NEarly self extubated  Plan Advance et tube 4cm Add versed prn  Dr. Brand Males, M.D., [facility].C.P Pulmonary and Critical Care Medicine Staff Physician [place] [facility] Pager: [phone], If no answer or between  15:00h - 7:00h: call [phone]  11/06/2014 11:24 AM

## 2014-11-06 NOTE — Progress Notes (Signed)
ANTICOAGULATION CONSULT NOTE - Follow Up Consult  Pharmacy Consult for Heparin  Indication: atrial fibrillation/CAD  Allergies  Allergen Reactions  . Bee Venom Anaphylaxis  . Shrimp [Shellfish Allergy] Swelling    Patient Measurements: Height: 5' 7.5" (171.5 cm) Weight: 223 lb 1.7 oz (101.2 kg) IBW/kg (Calculated) : 62.75  Vital Signs: Temp: 98.1 F (36.7 C) (03/24 0800) Temp Source: Core (Comment) (03/24 0800) BP: 113/59 mmHg (03/24 0800) Pulse Rate: 59 (03/24 0800)  Labs:  Recent Labs  12/02/2014 1820  11/04/14  11/04/14 0355 11/04/14 0740  11/05/14 0400 11/05/14 0805 11/05/14 1230 11/05/14 1538 11/06/14 0400  HGB  --   < >  --   --   --  13.7  --  13.1  --   --   --  12.4  HCT  --   < >  --   --   --  40.6  --  39.0  --   --   --  37.2  PLT  --   --   --   --   --  154  --  110*  --   --   --  91*  APTT 26  --   --   --  53*  --   --   --   --   --   --   --   LABPROT 15.1  --   --   --  14.9  --   --   --   --   --   --   --   INR 1.17  --   --   --  1.15  --   --   --   --   --   --   --   HEPARINUNFRC  --   --   --   < > 0.13*  --   --  0.41  --  0.47  --  0.38  CREATININE 1.49*  < > 1.06  --  1.10 1.13*  < > 1.04 1.09  --  1.21* 1.10  TROPONINI 0.08*  --  0.25*  --   --  0.19*  --   --   --   --   --   --   < > = values in this interval not displayed.  Estimated Creatinine Clearance: 59.6 mL/min (by C-G formula based on Cr of 1.1).   Assessment: 69 year old female on IV heparin (apixaban on hold) for atrial fibrillation. Unable to tolerate cath on 12-02-2022, pt coded in cath lab.Initiated on [employer]. Severe bradycardia 12-02-2022 and patient was rewarmed to 36 degrees.   S/p cath 3/22 found to have multivessel CAD. Surgery feels taht she is currently not a surgical candidate give sedation and pressor requirements. Plan is to continue medical management and reassess as neuro function recovers and patient is more stable.   Will continue heparin now for CAD and new  afib. Patient does not appear to be in afib currently. Heparin continues to be at goal on 1200 units/hr, no bleeding issues noted, hgb trended down this morning and pltc is also down today to 91. Will follow trend, if continues to trend down will need to consider heparin allergy.   Goal of Therapy:  Heparin level 0.3-0.7 units/ml Monitor platelets by anticoagulation protocol: Yes   Plan:  -Continue heparin at 1200 units/hr -Check HL at noon -Daily CBC/HL -Monitor for bleeding  Erin Hearing PharmD., BCPS Clinical Pharmacist Pager [phone] 11/06/2014 8:51 AM

## 2014-11-06 NOTE — Progress Notes (Signed)
DAILY PROGRESS NOTE  Subjective:  Intubated - off sedation. Agitated yesterday and overnight when off of sedation. Awaiting neurologic recovery for possible CABG. I's/o's becoming more positive.  Objective:  Temp:  [97.2 F (36.2 C)-99.3 F (37.4 C)] 97.5 F (36.4 C) (03/24 0700) Pulse Rate:  [48-116] 60 (03/24 0722) Resp:  [28] 28 (03/24 0722) BP: (94-178)/(47-117) 120/63 mmHg (03/24 0722) SpO2:  [91 %-100 %] 99 % (03/24 0722) Arterial Line BP: (107-333)/(61-160) 112/62 mmHg (03/24 0600) FiO2 (%):  [40 %] 40 % (03/24 0722) Weight:  [223 lb 1.7 oz (101.2 kg)] 223 lb 1.7 oz (101.2 kg) (03/24 0500) Weight change: 2 lb 3.3 oz (1 kg)  Intake/Output from previous day: 03/23 0701 - 03/24 0700 In: 1936.3 [I.V.:1936.3] Out: 1075 [Urine:775; Emesis/NG output:300]  Intake/Output from this shift:    Medications: Current Facility-Administered Medications  Medication Dose Route Frequency Provider Last Rate Last Dose  . 0.9 %  sodium chloride infusion   Intravenous Continuous Troy Sine, MD 50 mL/hr at 11/05/14 1900    . acetaminophen (TYLENOL) tablet 650 mg  650 mg Oral Q4H PRN Troy Sine, MD      . antiseptic oral rinse (CPC / CETYLPYRIDINIUM CHLORIDE 0.05%) solution 7 mL  7 mL Mouth Rinse QID Corey Harold, NP   7 mL at 11/06/14 0400  . artificial tears (LACRILUBE) ophthalmic ointment 1 application  1 application Both Eyes 3 times per day Corey Harold, NP   1 application at 76/22/63 [phone]  . aspirin EC tablet 81 mg  81 mg Oral Daily Troy Sine, MD   81 mg at 11/05/14 1117  . atorvastatin (LIPITOR) tablet 80 mg  80 mg Oral q1800 Troy Sine, MD   80 mg at 11/05/14 1650  . chlorhexidine (PERIDEX) 0.12 % solution 15 mL  15 mL Mouth Rinse BID Corey Harold, NP   15 mL at 11/06/14 0747  . dexmedetomidine (PRECEDEX) 200 MCG/50ML (4 mcg/mL) infusion  0-1.2 mcg/kg/hr Intravenous Continuous Kara Mead V, MD 10 mL/hr at 11/06/14 0635 0.4 mcg/kg/hr at 11/06/14 0635  .  docusate (COLACE) 50 MG/5ML liquid 100 mg  100 mg Oral Daily Brand Males, MD   100 mg at 11/05/14 1117  . fentaNYL (SUBLIMAZE) 2,500 mcg in sodium chloride 0.9 % 250 mL (10 mcg/mL) infusion  25-400 mcg/hr Intravenous Continuous Corey Harold, NP   Stopped at 11/06/14 0125  . fentaNYL (SUBLIMAZE) bolus via infusion 25 mcg  25 mcg Intravenous Q30 min PRN Corey Harold, NP   25 mcg at 11/06/14 0024  . heparin ADULT infusion 100 units/mL (25000 units/250 mL)  1,200 Units/hr Intravenous Continuous Lyndee Leo, RPH 12 mL/hr at 11/06/14 0552 1,200 Units/hr at 11/06/14 0552  . hydrALAZINE (APRESOLINE) injection 10-40 mg  10-40 mg Intravenous Q4H PRN Corey Harold, NP   20 mg at 11/06/14 0014  . insulin aspart (novoLOG) injection 0-15 Units  0-15 Units Subcutaneous 6 times per day Juanito Doom, MD   2 Units at 11/05/14 1245  . midazolam (VERSED) 50 mg in sodium chloride 0.9 % 50 mL (1 mg/mL) infusion  1-10 mg/hr Intravenous Continuous Juanito Doom, MD   Stopped at 11/06/14 0125  . norepinephrine (LEVOPHED) 4 mg in dextrose 5 % 250 mL (0.016 mg/mL) infusion  0-40 mcg/min Intravenous Continuous [name], MD   Stopped at 11/05/14 1600  . ondansetron (ZOFRAN) injection 4 mg  4 mg Intravenous Q6H PRN Troy Sine,  MD      . pantoprazole (PROTONIX) injection 40 mg  40 mg Intravenous Q24H Corey Harold, NP   40 mg at 11/05/14 1650    Physical Exam: General appearance: intubed on vent, off sedation, re-warmed Neck: no carotid bruit and no JVD Lungs: clear to auscultation bilaterally Heart: regular rhythm - paced Abdomen: soft, no masses Extremities: extremities normal, atraumatic, no cyanosis or edema Pulses: 2+ and symmetric Skin: Skin color, texture, turgor normal. No rashes or lesions Neurologic: Mental status: arouses to stimuli, non-purposeful  movements Psych: Cannot assess  Lab Results: Results for orders placed or performed during the hospital encounter of 10/29/14  (from the past 48 hour(s))  Glucose, capillary     Status: Abnormal   Collection Time: 11/04/14 10:37 AM  Result Value Ref Range   Glucose-Capillary 117 (H) 70 - 99 mg/dL   Comment 1 Capillary Specimen   Blood gas, arterial     Status: Abnormal   Collection Time: 11/04/14 10:45 AM  Result Value Ref Range   FIO2 0.60 %   Delivery systems VENTILATOR    Mode PRESSURE CONTROL    Rate 20 resp/min   Peep/cpap 10.0 cm H20   pH, Arterial 7.277 (L) 7.350 - 7.450   pCO2 arterial 47.6 (H) 35.0 - 45.0 mmHg   pO2, Arterial 144.0 (H) 80.0 - 100.0 mmHg   Bicarbonate 21.5 20.0 - 24.0 mEq/L   TCO2 23.0 0 - 100 mmol/L   Acid-base deficit 4.2 (H) 0.0 - 2.0 mmol/L   O2 Saturation 98.3 %   Patient temperature 98.6    Collection site A-LINE    Drawn by COLLECTED BY NURSE    Sample type ARTERIAL DRAW    Allens test (pass/fail) PASS PASS  POCT Activated clotting time     Status: None   Collection Time: 11/04/14 12:04 PM  Result Value Ref Range   Activated Clotting Time 110 seconds  Glucose, capillary     Status: Abnormal   Collection Time: 11/04/14  2:05 PM  Result Value Ref Range   Glucose-Capillary 119 (H) 70 - 99 mg/dL  Blood gas, arterial     Status: Abnormal   Collection Time: 11/04/14  2:15 PM  Result Value Ref Range   FIO2 0.60 %   Delivery systems VENTILATOR    Mode PRESSURE REGULATED VOLUME CONTROL    VT 500 mL   Rate 28 resp/min   Peep/cpap 8.0 cm H20   pH, Arterial 7.446 7.350 - 7.450   pCO2 arterial 27.6 (L) 35.0 - 45.0 mmHg   pO2, Arterial 157.0 (H) 80.0 - 100.0 mmHg   Bicarbonate 18.7 (L) 20.0 - 24.0 mEq/L   TCO2 19.5 0 - 100 mmol/L   Acid-base deficit 4.7 (H) 0.0 - 2.0 mmol/L   O2 Saturation 98.9 %   Patient temperature 98.6    Collection site A-LINE    Drawn by [id number]    Sample type ARTERIAL DRAW    Allens test (pass/fail) PASS PASS  Basic metabolic panel     Status: Abnormal   Collection Time: 11/04/14  4:30 PM  Result Value Ref Range   Sodium 141 135 - 145 mmol/L     Potassium 3.3 (L) 3.5 - 5.1 mmol/L    Comment: DELTA CHECK NOTED   Chloride 117 (H) 96 - 112 mmol/L   CO2 19 19 - 32 mmol/L   Glucose, Bld 91 70 - 99 mg/dL   BUN 20 6 - 23 mg/dL   Creatinine, Ser 1.15 (H)  0.50 - 1.10 mg/dL   Calcium 8.4 8.4 - 10.5 mg/dL   GFR calc non Af Amer 47 (L) >90 mL/min   GFR calc Af Amer 55 (L) >90 mL/min    Comment: (NOTE) The eGFR has been calculated using the CKD EPI equation. This calculation has not been validated in all clinical situations. eGFR's persistently <90 mL/min signify possible Chronic Kidney Disease.    Anion gap 5 5 - 15  Glucose, capillary     Status: None   Collection Time: 11/04/14  4:44 PM  Result Value Ref Range   Glucose-Capillary 91 70 - 99 mg/dL   Comment 1 Capillary Specimen   Glucose, capillary     Status: None   Collection Time: 11/04/14  6:02 PM  Result Value Ref Range   Glucose-Capillary 88 70 - 99 mg/dL  Basic metabolic panel     Status: Abnormal   Collection Time: 11/04/14  8:15 PM  Result Value Ref Range   Sodium 141 135 - 145 mmol/L   Potassium 3.6 3.5 - 5.1 mmol/L   Chloride 116 (H) 96 - 112 mmol/L   CO2 18 (L) 19 - 32 mmol/L   Glucose, Bld 89 70 - 99 mg/dL   BUN 21 6 - 23 mg/dL   Creatinine, Ser 1.06 0.50 - 1.10 mg/dL   Calcium 8.6 8.4 - 10.5 mg/dL   GFR calc non Af Amer 52 (L) >90 mL/min   GFR calc Af Amer 61 (L) >90 mL/min    Comment: (NOTE) The eGFR has been calculated using the CKD EPI equation. This calculation has not been validated in all clinical situations. eGFR's persistently <90 mL/min signify possible Chronic Kidney Disease.    Anion gap 7 5 - 15  Glucose, capillary     Status: None   Collection Time: 11/04/14  8:15 PM  Result Value Ref Range   Glucose-Capillary 88 70 - 99 mg/dL   Comment 1 Arterial Specimen   Basic metabolic panel     Status: Abnormal   Collection Time: 11/05/14 12:01 AM  Result Value Ref Range   Sodium 141 135 - 145 mmol/L   Potassium 3.5 3.5 - 5.1 mmol/L   Chloride  117 (H) 96 - 112 mmol/L   CO2 20 19 - 32 mmol/L   Glucose, Bld 103 (H) 70 - 99 mg/dL   BUN 18 6 - 23 mg/dL   Creatinine, Ser 1.02 0.50 - 1.10 mg/dL   Calcium 8.5 8.4 - 10.5 mg/dL   GFR calc non Af Amer 55 (L) >90 mL/min   GFR calc Af Amer 64 (L) >90 mL/min    Comment: (NOTE) The eGFR has been calculated using the CKD EPI equation. This calculation has not been validated in all clinical situations. eGFR's persistently <90 mL/min signify possible Chronic Kidney Disease.    Anion gap 4 (L) 5 - 15  Glucose, capillary     Status: None   Collection Time: 11/05/14 12:10 AM  Result Value Ref Range   Glucose-Capillary 89 70 - 99 mg/dL   Comment 1 Arterial Specimen   Glucose, capillary     Status: Abnormal   Collection Time: 11/05/14  3:50 AM  Result Value Ref Range   Glucose-Capillary 117 (H) 70 - 99 mg/dL   Comment 1 Arterial Specimen   Basic metabolic panel     Status: Abnormal   Collection Time: 11/05/14  4:00 AM  Result Value Ref Range   Sodium 141 135 - 145 mmol/L   Potassium 3.4 (  L) 3.5 - 5.1 mmol/L   Chloride 115 (H) 96 - 112 mmol/L   CO2 19 19 - 32 mmol/L   Glucose, Bld 104 (H) 70 - 99 mg/dL   BUN 15 6 - 23 mg/dL   Creatinine, Ser 1.04 0.50 - 1.10 mg/dL   Calcium 8.3 (L) 8.4 - 10.5 mg/dL   GFR calc non Af Amer 54 (L) >90 mL/min   GFR calc Af Amer 62 (L) >90 mL/min    Comment: (NOTE) The eGFR has been calculated using the CKD EPI equation. This calculation has not been validated in all clinical situations. eGFR's persistently <90 mL/min signify possible Chronic Kidney Disease.    Anion gap 7 5 - 15  Magnesium     Status: None   Collection Time: 11/05/14  4:00 AM  Result Value Ref Range   Magnesium 1.7 1.5 - 2.5 mg/dL  Phosphorus     Status: None   Collection Time: 11/05/14  4:00 AM  Result Value Ref Range   Phosphorus 2.5 2.3 - 4.6 mg/dL  CBC with Differential/Platelet     Status: Abnormal   Collection Time: 11/05/14  4:00 AM  Result Value Ref Range   WBC 13.1  (H) 4.0 - 10.5 K/uL   RBC 4.45 3.87 - 5.11 MIL/uL   Hemoglobin 13.1 12.0 - 15.0 g/dL   HCT 39.0 36.0 - 46.0 %   MCV 87.6 78.0 - 100.0 fL   MCH 29.4 26.0 - 34.0 pg   MCHC 33.6 30.0 - 36.0 g/dL   RDW 16.2 (H) 11.5 - 15.5 %   Platelets 110 (L) 150 - 400 K/uL    Comment: PLATELET COUNT CONFIRMED BY SMEAR DELTA CHECK NOTED    Neutrophils Relative % 67 43 - 77 %   Neutro Abs 8.8 (H) 1.7 - 7.7 K/uL   Lymphocytes Relative 25 12 - 46 %   Lymphs Abs 3.3 0.7 - 4.0 K/uL   Monocytes Relative 7 3 - 12 %   Monocytes Absolute 1.0 0.1 - 1.0 K/uL   Eosinophils Relative 0 0 - 5 %   Eosinophils Absolute 0.1 0.0 - 0.7 K/uL   Basophils Relative 0 0 - 1 %   Basophils Absolute 0.0 0.0 - 0.1 K/uL  Heparin level (unfractionated)     Status: None   Collection Time: 11/05/14  4:00 AM  Result Value Ref Range   Heparin Unfractionated 0.41 0.30 - 0.70 IU/mL    Comment:        IF HEPARIN RESULTS ARE BELOW EXPECTED VALUES, AND PATIENT DOSAGE HAS BEEN CONFIRMED, SUGGEST FOLLOW UP TESTING OF ANTITHROMBIN III LEVELS.   Glucose, capillary     Status: Abnormal   Collection Time: 11/05/14  7:52 AM  Result Value Ref Range   Glucose-Capillary 104 (H) 70 - 99 mg/dL   Comment 1 Arterial Specimen   Basic metabolic panel     Status: Abnormal   Collection Time: 11/05/14  8:05 AM  Result Value Ref Range   Sodium 143 135 - 145 mmol/L   Potassium 3.1 (L) 3.5 - 5.1 mmol/L   Chloride 119 (H) 96 - 112 mmol/L   CO2 20 19 - 32 mmol/L   Glucose, Bld 95 70 - 99 mg/dL   BUN 15 6 - 23 mg/dL   Creatinine, Ser 1.09 0.50 - 1.10 mg/dL   Calcium 7.8 (L) 8.4 - 10.5 mg/dL   GFR calc non Af Amer 51 (L) >90 mL/min   GFR calc Af Amer 59 (L) >90 mL/min  Comment: (NOTE) The eGFR has been calculated using the CKD EPI equation. This calculation has not been validated in all clinical situations. eGFR's persistently <90 mL/min signify possible Chronic Kidney Disease.    Anion gap 4 (L) 5 - 15  Glucose, capillary     Status:  Abnormal   Collection Time: 11/05/14 12:29 PM  Result Value Ref Range   Glucose-Capillary 127 (H) 70 - 99 mg/dL   Comment 1 Capillary Specimen   Heparin level (unfractionated)     Status: None   Collection Time: 11/05/14 12:30 PM  Result Value Ref Range   Heparin Unfractionated 0.47 0.30 - 0.70 IU/mL    Comment:        IF HEPARIN RESULTS ARE BELOW EXPECTED VALUES, AND PATIENT DOSAGE HAS BEEN CONFIRMED, SUGGEST FOLLOW UP TESTING OF ANTITHROMBIN III LEVELS.   Basic metabolic panel     Status: Abnormal   Collection Time: 11/05/14  3:38 PM  Result Value Ref Range   Sodium 142 135 - 145 mmol/L   Potassium 3.4 (L) 3.5 - 5.1 mmol/L   Chloride 116 (H) 96 - 112 mmol/L   CO2 22 19 - 32 mmol/L   Glucose, Bld 119 (H) 70 - 99 mg/dL   BUN 20 6 - 23 mg/dL   Creatinine, Ser 1.21 (H) 0.50 - 1.10 mg/dL   Calcium 8.6 8.4 - 10.5 mg/dL   GFR calc non Af Amer 45 (L) >90 mL/min   GFR calc Af Amer 52 (L) >90 mL/min    Comment: (NOTE) The eGFR has been calculated using the CKD EPI equation. This calculation has not been validated in all clinical situations. eGFR's persistently <90 mL/min signify possible Chronic Kidney Disease.    Anion gap 4 (L) 5 - 15  Glucose, capillary     Status: None   Collection Time: 11/05/14  4:32 PM  Result Value Ref Range   Glucose-Capillary 98 70 - 99 mg/dL   Comment 1 Arterial Specimen   Glucose, capillary     Status: None   Collection Time: 11/05/14  7:49 PM  Result Value Ref Range   Glucose-Capillary 85 70 - 99 mg/dL   Comment 1 Arterial Specimen   Glucose, capillary     Status: Abnormal   Collection Time: 11/06/14  1:21 AM  Result Value Ref Range   Glucose-Capillary 103 (H) 70 - 99 mg/dL   Comment 1 Arterial Specimen   Magnesium     Status: None   Collection Time: 11/06/14  4:00 AM  Result Value Ref Range   Magnesium 1.8 1.5 - 2.5 mg/dL  Phosphorus     Status: None   Collection Time: 11/06/14  4:00 AM  Result Value Ref Range   Phosphorus 3.5 2.3 - 4.6  mg/dL  Basic metabolic panel     Status: Abnormal   Collection Time: 11/06/14  4:00 AM  Result Value Ref Range   Sodium 140 135 - 145 mmol/L   Potassium 3.0 (L) 3.5 - 5.1 mmol/L   Chloride 116 (H) 96 - 112 mmol/L   CO2 16 (L) 19 - 32 mmol/L   Glucose, Bld 111 (H) 70 - 99 mg/dL   BUN 19 6 - 23 mg/dL   Creatinine, Ser 1.10 0.50 - 1.10 mg/dL   Calcium 8.4 8.4 - 10.5 mg/dL   GFR calc non Af Amer 50 (L) >90 mL/min   GFR calc Af Amer 58 (L) >90 mL/min    Comment: (NOTE) The eGFR has been calculated using the CKD EPI  equation. This calculation has not been validated in all clinical situations. eGFR's persistently <90 mL/min signify possible Chronic Kidney Disease.    Anion gap 8 5 - 15  CBC with Differential/Platelet     Status: Abnormal   Collection Time: 11/06/14  4:00 AM  Result Value Ref Range   WBC 10.7 (H) 4.0 - 10.5 K/uL   RBC 4.27 3.87 - 5.11 MIL/uL   Hemoglobin 12.4 12.0 - 15.0 g/dL   HCT 37.2 36.0 - 46.0 %   MCV 87.1 78.0 - 100.0 fL   MCH 29.0 26.0 - 34.0 pg   MCHC 33.3 30.0 - 36.0 g/dL   RDW 16.4 (H) 11.5 - 15.5 %   Platelets 91 (L) 150 - 400 K/uL    Comment: REPEATED TO VERIFY CONSISTENT WITH PREVIOUS RESULT    Neutrophils Relative % 79 (H) 43 - 77 %   Neutro Abs 8.5 (H) 1.7 - 7.7 K/uL   Lymphocytes Relative 13 12 - 46 %   Lymphs Abs 1.4 0.7 - 4.0 K/uL   Monocytes Relative 8 3 - 12 %   Monocytes Absolute 0.9 0.1 - 1.0 K/uL   Eosinophils Relative 0 0 - 5 %   Eosinophils Absolute 0.0 0.0 - 0.7 K/uL   Basophils Relative 0 0 - 1 %   Basophils Absolute 0.0 0.0 - 0.1 K/uL  Heparin level (unfractionated)     Status: None   Collection Time: 11/06/14  4:00 AM  Result Value Ref Range   Heparin Unfractionated 0.38 0.30 - 0.70 IU/mL    Comment:        IF HEPARIN RESULTS ARE BELOW EXPECTED VALUES, AND PATIENT DOSAGE HAS BEEN CONFIRMED, SUGGEST FOLLOW UP TESTING OF ANTITHROMBIN III LEVELS.   Glucose, capillary     Status: Abnormal   Collection Time: 11/06/14  4:25 AM    Result Value Ref Range   Glucose-Capillary 117 (H) 70 - 99 mg/dL   Comment 1 Arterial Specimen     Imaging: Dg Chest Port 1 View  11/05/2014   CLINICAL DATA:  Intubation.  EXAM: PORTABLE CHEST - 1 VIEW  COMPARISON:  11/04/2014.  FINDINGS: Endotracheal tube, NG tube, right IJ line in stable position. Cardiomegaly bilateral pulmonary interstitial prominence and pleural effusions consistent congestive heart failure. Similar findings noted on prior exam. No pneumothorax.  IMPRESSION: 1. Lines and tubes in stable position. 2. Persistent cardiomegaly with bilateral pulmonary interstitial infiltrates and pleural effusions suggesting congestive heart failure with pulmonary edema. Similar findings noted on prior exam.   Electronically Signed   By: Luke   On: 11/05/2014 07:35    Assessment:  Principal Problem:   Cardiac arrest Active Problems:   HTN (hypertension)   Atrial fibrillation   CAP (community acquired pneumonia)   Cardiomyopathy   Hypokalemia   Acute on chronic combined systolic and diastolic HF (heart failure)   NSTEMI (non-ST elevated myocardial infarction)   SOB (shortness of breath)   Atrial fibrillation with RVR   PEA (Pulseless electrical activity)   Acute respiratory failure with hypoxemia   Anoxic brain injury   Essential hypertension   Cardiomyopathy, ischemic   Coronary artery disease due to lipid rich plaque   Plan:  1. Cardiac arrest - Ms. Wimbush had a brady-asystolic arrest. She underwent cardiac catheterization yesterday which revealed 80% LM stenosis and multivessel CAD. CT surgery was consulted and did not feel at present she was a CABG candidate - however, are waiting to see how she recovers after weaning of sedation  and extubation. Pacer seems necessary at this point, due to intermittent bradycardia. Continue heparin and aspirin for severe CAD. 2. A-fib with RVR - intermittent with elevated troponins. Suspect ischemic etiology. She is currently  V-paced at 60 from a temporary pacing wire placed yesterday. On heparin, will need to continue. 3. Cardiomyopathy - clear ischemic etiology. LVEF 35-40% - CABG is indicated based on cath findings.  CVP this am was 12.  Would aim to keep CVP<15 - I's/O's indicate she is probably retaining fluid. Give lasix 40 mg IV x 1 today. 4. Pneumonia - was on antibiotics - not currently 5. Hypertension - now on levophed due to relative hypotension in the setting of recent arrest - keep MAP >70 6. Possible anoxic brain injury - EEG showed moderate encephalopathy - no sign of seizure activity. Remains agitated. 7. Hypokalemia- replete potassium today.   CRITICAL CARE:  The patient is critically ill with multi-organ system failure and requires high complexity decision making for assessment and support, frequent evaluation and titration of therapies, application of advanced monitoring technologies and extensive interpretation of multiple databases.  Time Spent Directly with Patient:  45 minutes  Length of Stay:  LOS: 7 days   [name], MD, [facility] Attending Cardiologist [facility]  Rane Dumm C 11/06/2014, 7:55 AM

## 2014-11-06 NOTE — Progress Notes (Signed)
Pt failed to paced at times and failure to capture,pos lead noted to be loose from pt-reconnected still failed to capture-MA increase from 5-10 w/ improvement;Dr Philbert Riser notified w/ order to check bmet and mg stat.Site dressing change w/ old blood noted on site,no new bleeding

## 2014-11-06 NOTE — Progress Notes (Signed)
When starting patient bath, pt coming out of bed. Pt also lost capture for about 2 minutes and dropped HR to mid 50's. RN was able to perform foley care and change pillow case/top sheet. Unable to lay patient flat enough to change bottom sheet or pad or fully bath patient.

## 2014-11-07 ENCOUNTER — Inpatient Hospital Stay (HOSPITAL_COMMUNITY): Payer: Commercial Managed Care - HMO

## 2014-11-07 DIAGNOSIS — I5043 Acute on chronic combined systolic (congestive) and diastolic (congestive) heart failure: Secondary | ICD-10-CM

## 2014-11-07 LAB — GLUCOSE, CAPILLARY
Glucose-Capillary: 145 mg/dL — ABNORMAL HIGH (ref 70–99)
Glucose-Capillary: 149 mg/dL — ABNORMAL HIGH (ref 70–99)
Glucose-Capillary: 120 mg/dL — ABNORMAL HIGH (ref 70–99)
Glucose-Capillary: 105 mg/dL — ABNORMAL HIGH (ref 70–99)

## 2014-11-07 LAB — BASIC METABOLIC PANEL
Anion gap: 7 (ref 5–15)
BUN: 21 mg/dL (ref 6–23)
CO2: 19 mmol/L (ref 19–32)
Calcium: 8.7 mg/dL (ref 8.4–10.5)
Chloride: 115 mmol/L — ABNORMAL HIGH (ref 96–112)
Creatinine, Ser: 1.13 mg/dL — ABNORMAL HIGH (ref 0.50–1.10)
GFR calc Af Amer: 56 mL/min — ABNORMAL LOW (ref >90)
GFR calc non Af Amer: 48 mL/min — ABNORMAL LOW (ref >90)
Glucose, Bld: 136 mg/dL — ABNORMAL HIGH (ref 70–99)
Potassium: 3.8 mmol/L (ref 3.5–5.1)
Sodium: 141 mmol/L (ref 135–145)

## 2014-11-07 LAB — PHOSPHORUS: Phosphorus: 3.9 mg/dL (ref 2.3–4.6)

## 2014-11-07 LAB — CBC WITH DIFFERENTIAL/PLATELET
Basophils Absolute: 0 10*3/uL (ref 0.0–0.1)
Basophils Relative: 0 % (ref 0–1)
Eosinophils Absolute: 0 10*3/uL (ref 0.0–0.7)
Eosinophils Relative: 0 % (ref 0–5)
HCT: 35.8 % — ABNORMAL LOW (ref 36.0–46.0)
Hemoglobin: 12 g/dL (ref 12.0–15.0)
Lymphocytes Relative: 12 % (ref 12–46)
Lymphs Abs: 1.1 10*3/uL (ref 0.7–4.0)
MCH: 29.4 pg (ref 26.0–34.0)
MCHC: 33.5 g/dL (ref 30.0–36.0)
MCV: 87.7 fL (ref 78.0–100.0)
Monocytes Absolute: 0.7 10*3/uL (ref 0.1–1.0)
Monocytes Relative: 7 % (ref 3–12)
Neutro Abs: 7.3 10*3/uL (ref 1.7–7.7)
Neutrophils Relative %: 80 % — ABNORMAL HIGH (ref 43–77)
Platelets: 84 10*3/uL — ABNORMAL LOW (ref 150–400)
RBC: 4.08 MIL/uL (ref 3.87–5.11)
RDW: 16.5 % — ABNORMAL HIGH (ref 11.5–15.5)
WBC: 9.2 10*3/uL (ref 4.0–10.5)

## 2014-11-07 LAB — POCT I-STAT 3, ART BLOOD GAS (G3+)
Acid-base deficit: 5 mmol/L — ABNORMAL HIGH (ref 0.0–2.0)
Bicarbonate: 19 mEq/L — ABNORMAL LOW (ref 20.0–24.0)
O2 Saturation: 96 %
Patient temperature: 98.6
TCO2: 20 mmol/L (ref 0–100)
pCO2 arterial: 32.2 mmHg — ABNORMAL LOW (ref 35.0–45.0)
pH, Arterial: 7.378 (ref 7.350–7.450)
pO2, Arterial: 79 mmHg — ABNORMAL LOW (ref 80.0–100.0)

## 2014-11-07 LAB — CBC
HCT: 36.3 % (ref 36.0–46.0)
Hemoglobin: 12.1 g/dL (ref 12.0–15.0)
MCH: 29.4 pg (ref 26.0–34.0)
MCHC: 33.3 g/dL (ref 30.0–36.0)
MCV: 88.1 fL (ref 78.0–100.0)
Platelets: 87 10*3/uL — ABNORMAL LOW (ref 150–400)
RBC: 4.12 MIL/uL (ref 3.87–5.11)
RDW: 16.4 % — ABNORMAL HIGH (ref 11.5–15.5)
WBC: 10.9 10*3/uL — ABNORMAL HIGH (ref 4.0–10.5)

## 2014-11-07 LAB — HEPARIN LEVEL (UNFRACTIONATED): Heparin Unfractionated: 0.3 IU/mL (ref 0.30–0.70)

## 2014-11-07 LAB — MAGNESIUM: Magnesium: 2.1 mg/dL (ref 1.5–2.5)

## 2014-11-07 MED ORDER — POTASSIUM CHLORIDE 20 MEQ/15ML (10%) PO SOLN
40.0000 meq | Freq: Once | ORAL | Status: AC
  Administered 2014-11-07: 40 meq
  Filled 2014-11-07: qty 30

## 2014-11-07 MED ORDER — LABETALOL HCL 5 MG/ML IV SOLN
20.0000 mg | INTRAVENOUS | Status: DC | PRN
Start: 2014-11-07 — End: 2014-11-09
  Administered 2014-11-07 – 2014-11-08 (×4): 20 mg via INTRAVENOUS
  Filled 2014-11-07 (×4): qty 4

## 2014-11-07 MED ORDER — FUROSEMIDE 10 MG/ML IJ SOLN
40.0000 mg | Freq: Once | INTRAMUSCULAR | Status: AC
  Administered 2014-11-07: 40 mg via INTRAVENOUS
  Filled 2014-11-07: qty 4

## 2014-11-07 MED ORDER — CLONIDINE HCL 0.2 MG/24HR TD PTWK
0.2000 mg | MEDICATED_PATCH | TRANSDERMAL | Status: DC
  Administered 2014-11-07: 0.2 mg via TRANSDERMAL
  Filled 2014-11-07 (×2): qty 1

## 2014-11-07 MED ORDER — DILTIAZEM HCL 100 MG IV SOLR
5.0000 mg/h | INTRAVENOUS | Status: DC
  Administered 2014-11-07: 15 mg/h via INTRAVENOUS
  Administered 2014-11-07: 5 mg/h via INTRAVENOUS
  Administered 2014-11-08: 10 mg/h via INTRAVENOUS
  Administered 2014-11-08 – 2014-11-09 (×2): 15 mg/h via INTRAVENOUS
  Filled 2014-11-07: qty 100

## 2014-11-07 MED ORDER — DILTIAZEM LOAD VIA INFUSION
20.0000 mg | Freq: Once | INTRAVENOUS | Status: AC
  Administered 2014-11-07: 20 mg via INTRAVENOUS
  Filled 2014-11-07: qty 20

## 2014-11-07 NOTE — Progress Notes (Signed)
R Radial aline got pulled accidentally by pt.;respiratory made aware.

## 2014-11-07 NOTE — Progress Notes (Signed)
DAILY PROGRESS NOTE  Subjective:  Intubated - off sedation. Some agitation, but seems to follow some RN commands when sedation is weaned. Plan for SBT today per PCCM. Still v-pacing at 32.  Objective:  Temp:  [96.4 F (35.8 C)-99.5 F (37.5 C)] 98.1 F (36.7 C) (03/25 0700) Pulse Rate:  [43-70] 60 (03/25 0715) Resp:  [16-38] 20 (03/25 0715) BP: (99-183)/(48-81) 134/75 mmHg (03/25 0715) SpO2:  [90 %-100 %] 100 % (03/25 0700) Arterial Line BP: (113-177)/(61-86) 133/74 mmHg (03/25 0700) FiO2 (%):  [40 %] 40 % (03/25 0730) Weight:  [224 lb 10.4 oz (101.9 kg)] 224 lb 10.4 oz (101.9 kg) (03/25 0407) Weight change: 1 lb 8.7 oz (0.7 kg)  Intake/Output from previous day: 03/24 0701 - 03/25 0700 In: 2262.5 [I.V.:1652.5; NG/GT:610] Out: 2190 [Urine:2170; Emesis/NG output:20]  Intake/Output from this shift:    Medications: Current Facility-Administered Medications  Medication Dose Route Frequency Provider Last Rate Last Dose  . 0.9 %  sodium chloride infusion   Intravenous Continuous Troy Sine, MD 50 mL/hr at 11/05/14 1900    . acetaminophen (TYLENOL) tablet 650 mg  650 mg Oral Q4H PRN Troy Sine, MD      . antiseptic oral rinse (CPC / CETYLPYRIDINIUM CHLORIDE 0.05%) solution 7 mL  7 mL Mouth Rinse QID Corey Harold, NP   7 mL at 11/07/14 0400  . aspirin EC tablet 81 mg  81 mg Oral Daily Troy Sine, MD   81 mg at 11/06/14 0908  . atorvastatin (LIPITOR) tablet 80 mg  80 mg Oral q1800 Troy Sine, MD   80 mg at 11/06/14 1753  . chlorhexidine (PERIDEX) 0.12 % solution 15 mL  15 mL Mouth Rinse BID Corey Harold, NP   15 mL at 11/06/14 2025  . dexmedetomidine (PRECEDEX) 400 MCG/100ML (4 mcg/mL) infusion  0.4-1.2 mcg/kg/hr Intravenous Titrated Brand Males, MD 15.2 mL/hr at 11/07/14 0621 0.6 mcg/kg/hr at 11/07/14 5038  . docusate (COLACE) 50 MG/5ML liquid 100 mg  100 mg Oral Daily Brand Males, MD   100 mg at 11/06/14 0908  . feeding supplement (PRO-STAT SUGAR  FREE 64) liquid 30 mL  30 mL Per Tube TID Asencion Islam, RD   30 mL at 11/06/14 2217  . feeding supplement (VITAL HIGH PROTEIN) liquid 1,000 mL  1,000 mL Per Tube Q24H Asencion Islam, RD   1,000 mL at 11/06/14 1555  . fentaNYL (SUBLIMAZE) bolus via infusion 50-200 mcg  50-200 mcg Intravenous Q30 min PRN Brand Males, MD      . fentaNYL (SUBLIMAZE) injection 100 mcg  100 mcg Intravenous Q1H PRN Brand Males, MD   100 mcg at 11/07/14 8828  . furosemide (LASIX) injection 40 mg  40 mg Intravenous Once Kara Mead V, MD      . heparin ADULT infusion 100 units/mL (25000 units/250 mL)  1,200 Units/hr Intravenous Continuous Lyndee Leo, RPH 12 mL/hr at 11/07/14 0434 1,200 Units/hr at 11/07/14 0434  . hydrALAZINE (APRESOLINE) injection 10-40 mg  10-40 mg Intravenous Q4H PRN Corey Harold, NP   20 mg at 11/06/14 0014  . insulin aspart (novoLOG) injection 0-15 Units  0-15 Units Subcutaneous 6 times per day Juanito Doom, MD   2 Units at 11/07/14 [phone]  . midazolam (VERSED) injection 2-6 mg  2-6 mg Intravenous Q2H PRN Brand Males, MD   2 mg at 11/07/14 0731  . ondansetron (ZOFRAN) injection 4 mg  4 mg Intravenous Q6H PRN Joyice Faster  Claiborne Billings, MD      . pantoprazole (PROTONIX) injection 40 mg  40 mg Intravenous Q24H Corey Harold, NP   40 mg at 11/06/14 1753    Physical Exam: General appearance: intubed on vent, off sedation, re-warmed Neck: no carotid bruit and no JVD Lungs: clear to auscultation bilaterally Heart: regular rhythm - paced Abdomen: soft, no masses Extremities: extremities normal, atraumatic, no cyanosis or edema Pulses: 2+ and symmetric Skin: Skin color, texture, turgor normal. No rashes or lesions Neurologic: Mental status: arouses to stimuli, non-purposeful  movements Psych: Cannot assess  Lab Results: Results for orders placed or performed during the hospital encounter of 10/29/14 (from the past 48 hour(s))  Glucose, capillary     Status: Abnormal   Collection  Time: 11/05/14 12:29 PM  Result Value Ref Range   Glucose-Capillary 127 (H) 70 - 99 mg/dL   Comment 1 Capillary Specimen   Heparin level (unfractionated)     Status: None   Collection Time: 11/05/14 12:30 PM  Result Value Ref Range   Heparin Unfractionated 0.47 0.30 - 0.70 IU/mL    Comment:        IF HEPARIN RESULTS ARE BELOW EXPECTED VALUES, AND PATIENT DOSAGE HAS BEEN CONFIRMED, SUGGEST FOLLOW UP TESTING OF ANTITHROMBIN III LEVELS.   Basic metabolic panel     Status: Abnormal   Collection Time: 11/05/14  3:38 PM  Result Value Ref Range   Sodium 142 135 - 145 mmol/L   Potassium 3.4 (L) 3.5 - 5.1 mmol/L   Chloride 116 (H) 96 - 112 mmol/L   CO2 22 19 - 32 mmol/L   Glucose, Bld 119 (H) 70 - 99 mg/dL   BUN 20 6 - 23 mg/dL   Creatinine, Ser 1.21 (H) 0.50 - 1.10 mg/dL   Calcium 8.6 8.4 - 10.5 mg/dL   GFR calc non Af Amer 45 (L) >90 mL/min   GFR calc Af Amer 52 (L) >90 mL/min    Comment: (NOTE) The eGFR has been calculated using the CKD EPI equation. This calculation has not been validated in all clinical situations. eGFR's persistently <90 mL/min signify possible Chronic Kidney Disease.    Anion gap 4 (L) 5 - 15  Glucose, capillary     Status: None   Collection Time: 11/05/14  4:32 PM  Result Value Ref Range   Glucose-Capillary 98 70 - 99 mg/dL   Comment 1 Arterial Specimen   Glucose, capillary     Status: None   Collection Time: 11/05/14  7:49 PM  Result Value Ref Range   Glucose-Capillary 85 70 - 99 mg/dL   Comment 1 Arterial Specimen   Glucose, capillary     Status: Abnormal   Collection Time: 11/06/14  1:21 AM  Result Value Ref Range   Glucose-Capillary 103 (H) 70 - 99 mg/dL   Comment 1 Arterial Specimen   Magnesium     Status: None   Collection Time: 11/06/14  4:00 AM  Result Value Ref Range   Magnesium 1.8 1.5 - 2.5 mg/dL  Phosphorus     Status: None   Collection Time: 11/06/14  4:00 AM  Result Value Ref Range   Phosphorus 3.5 2.3 - 4.6 mg/dL  Basic  metabolic panel     Status: Abnormal   Collection Time: 11/06/14  4:00 AM  Result Value Ref Range   Sodium 140 135 - 145 mmol/L   Potassium 3.0 (L) 3.5 - 5.1 mmol/L   Chloride 116 (H) 96 - 112 mmol/L   CO2  16 (L) 19 - 32 mmol/L   Glucose, Bld 111 (H) 70 - 99 mg/dL   BUN 19 6 - 23 mg/dL   Creatinine, Ser 1.10 0.50 - 1.10 mg/dL   Calcium 8.4 8.4 - 10.5 mg/dL   GFR calc non Af Amer 50 (L) >90 mL/min   GFR calc Af Amer 58 (L) >90 mL/min    Comment: (NOTE) The eGFR has been calculated using the CKD EPI equation. This calculation has not been validated in all clinical situations. eGFR's persistently <90 mL/min signify possible Chronic Kidney Disease.    Anion gap 8 5 - 15  CBC with Differential/Platelet     Status: Abnormal   Collection Time: 11/06/14  4:00 AM  Result Value Ref Range   WBC 10.7 (H) 4.0 - 10.5 K/uL   RBC 4.27 3.87 - 5.11 MIL/uL   Hemoglobin 12.4 12.0 - 15.0 g/dL   HCT 37.2 36.0 - 46.0 %   MCV 87.1 78.0 - 100.0 fL   MCH 29.0 26.0 - 34.0 pg   MCHC 33.3 30.0 - 36.0 g/dL   RDW 16.4 (H) 11.5 - 15.5 %   Platelets 91 (L) 150 - 400 K/uL    Comment: REPEATED TO VERIFY CONSISTENT WITH PREVIOUS RESULT    Neutrophils Relative % 79 (H) 43 - 77 %   Neutro Abs 8.5 (H) 1.7 - 7.7 K/uL   Lymphocytes Relative 13 12 - 46 %   Lymphs Abs 1.4 0.7 - 4.0 K/uL   Monocytes Relative 8 3 - 12 %   Monocytes Absolute 0.9 0.1 - 1.0 K/uL   Eosinophils Relative 0 0 - 5 %   Eosinophils Absolute 0.0 0.0 - 0.7 K/uL   Basophils Relative 0 0 - 1 %   Basophils Absolute 0.0 0.0 - 0.1 K/uL  Heparin level (unfractionated)     Status: None   Collection Time: 11/06/14  4:00 AM  Result Value Ref Range   Heparin Unfractionated 0.38 0.30 - 0.70 IU/mL    Comment:        IF HEPARIN RESULTS ARE BELOW EXPECTED VALUES, AND PATIENT DOSAGE HAS BEEN CONFIRMED, SUGGEST FOLLOW UP TESTING OF ANTITHROMBIN III LEVELS.   Glucose, capillary     Status: Abnormal   Collection Time: 11/06/14  4:25 AM  Result Value  Ref Range   Glucose-Capillary 117 (H) 70 - 99 mg/dL   Comment 1 Arterial Specimen   Glucose, capillary     Status: Abnormal   Collection Time: 11/06/14  7:27 AM  Result Value Ref Range   Glucose-Capillary 115 (H) 70 - 99 mg/dL   Comment 1 Capillary Specimen   Glucose, capillary     Status: Abnormal   Collection Time: 11/06/14 12:46 PM  Result Value Ref Range   Glucose-Capillary 113 (H) 70 - 99 mg/dL   Comment 1 Arterial Specimen   Glucose, capillary     Status: Abnormal   Collection Time: 11/06/14  4:13 PM  Result Value Ref Range   Glucose-Capillary 116 (H) 70 - 99 mg/dL   Comment 1 Arterial Specimen   Glucose, capillary     Status: Abnormal   Collection Time: 11/06/14  8:31 PM  Result Value Ref Range   Glucose-Capillary 114 (H) 70 - 99 mg/dL   Comment 1 Arterial Specimen   Basic metabolic panel     Status: Abnormal   Collection Time: 11/06/14  9:48 PM  Result Value Ref Range   Sodium 143 135 - 145 mmol/L   Potassium 3.5 3.5 -  5.1 mmol/L   Chloride 117 (H) 96 - 112 mmol/L   CO2 21 19 - 32 mmol/L   Glucose, Bld 124 (H) 70 - 99 mg/dL   BUN 18 6 - 23 mg/dL   Creatinine, Ser 1.12 (H) 0.50 - 1.10 mg/dL   Calcium 8.5 8.4 - 10.5 mg/dL   GFR calc non Af Amer 49 (L) >90 mL/min   GFR calc Af Amer 57 (L) >90 mL/min    Comment: (NOTE) The eGFR has been calculated using the CKD EPI equation. This calculation has not been validated in all clinical situations. eGFR's persistently <90 mL/min signify possible Chronic Kidney Disease.    Anion gap 5 5 - 15  Magnesium     Status: None   Collection Time: 11/06/14  9:48 PM  Result Value Ref Range   Magnesium 2.1 1.5 - 2.5 mg/dL  Magnesium     Status: None   Collection Time: 11/07/14  4:20 AM  Result Value Ref Range   Magnesium 2.1 1.5 - 2.5 mg/dL  Phosphorus     Status: None   Collection Time: 11/07/14  4:20 AM  Result Value Ref Range   Phosphorus 3.9 2.3 - 4.6 mg/dL  Basic metabolic panel     Status: Abnormal   Collection Time:  11/07/14  4:20 AM  Result Value Ref Range   Sodium 141 135 - 145 mmol/L   Potassium 3.8 3.5 - 5.1 mmol/L   Chloride 115 (H) 96 - 112 mmol/L   CO2 19 19 - 32 mmol/L   Glucose, Bld 136 (H) 70 - 99 mg/dL   BUN 21 6 - 23 mg/dL   Creatinine, Ser 1.13 (H) 0.50 - 1.10 mg/dL   Calcium 8.7 8.4 - 10.5 mg/dL   GFR calc non Af Amer 48 (L) >90 mL/min   GFR calc Af Amer 56 (L) >90 mL/min    Comment: (NOTE) The eGFR has been calculated using the CKD EPI equation. This calculation has not been validated in all clinical situations. eGFR's persistently <90 mL/min signify possible Chronic Kidney Disease.    Anion gap 7 5 - 15  CBC with Differential/Platelet     Status: Abnormal   Collection Time: 11/07/14  4:20 AM  Result Value Ref Range   WBC 9.2 4.0 - 10.5 K/uL   RBC 4.08 3.87 - 5.11 MIL/uL   Hemoglobin 12.0 12.0 - 15.0 g/dL   HCT 35.8 (L) 36.0 - 46.0 %   MCV 87.7 78.0 - 100.0 fL   MCH 29.4 26.0 - 34.0 pg   MCHC 33.5 30.0 - 36.0 g/dL   RDW 16.5 (H) 11.5 - 15.5 %   Platelets 84 (L) 150 - 400 K/uL    Comment: CONSISTENT WITH PREVIOUS RESULT   Neutrophils Relative % 80 (H) 43 - 77 %   Neutro Abs 7.3 1.7 - 7.7 K/uL   Lymphocytes Relative 12 12 - 46 %   Lymphs Abs 1.1 0.7 - 4.0 K/uL   Monocytes Relative 7 3 - 12 %   Monocytes Absolute 0.7 0.1 - 1.0 K/uL   Eosinophils Relative 0 0 - 5 %   Eosinophils Absolute 0.0 0.0 - 0.7 K/uL   Basophils Relative 0 0 - 1 %   Basophils Absolute 0.0 0.0 - 0.1 K/uL  Heparin level (unfractionated)     Status: None   Collection Time: 11/07/14  4:20 AM  Result Value Ref Range   Heparin Unfractionated 0.30 0.30 - 0.70 IU/mL    Comment:  IF HEPARIN RESULTS ARE BELOW EXPECTED VALUES, AND PATIENT DOSAGE HAS BEEN CONFIRMED, SUGGEST FOLLOW UP TESTING OF ANTITHROMBIN III LEVELS.     Imaging: Dg Chest Port 1 View  11/06/2014   CLINICAL DATA:  Pulled endotracheal tube  EXAM: PORTABLE CHEST - 1 VIEW  COMPARISON:  11/05/2011  FINDINGS: Endotracheal tube  terminates above the thoracic inlet, 12.5 cm above the carina.  Patchy bilateral lower lobe opacities, possibly atelectasis with small pleural effusions. Mild perihilar edema. No pneumothorax.  Cardiomegaly.  Right IJ venous catheter terminates in the lower SVC.  IMPRESSION: Endotracheal tube terminates above the thoracic inlet, 12.5 cm above the carina.  Otherwise unchanged.   Electronically Signed   By: Julian Hy M.D.   On: 11/06/2014 13:30    Assessment:  Principal Problem:   Cardiac arrest Active Problems:   HTN (hypertension)   Atrial fibrillation   CAP (community acquired pneumonia)   Cardiomyopathy   Hypokalemia   Acute on chronic combined systolic and diastolic HF (heart failure)   NSTEMI (non-ST elevated myocardial infarction)   SOB (shortness of breath)   Atrial fibrillation with RVR   PEA (Pulseless electrical activity)   Acute respiratory failure with hypoxemia   Anoxic brain injury   Essential hypertension   Cardiomyopathy, ischemic   Coronary artery disease due to lipid rich plaque   Plan:  1. Cardiac arrest - Ms. Bonnell had a brady-asystolic arrest. She underwent cardiac catheterization yesterday which revealed 80% LM stenosis and multivessel CAD. CT surgery was consulted and did not feel at present she was a CABG candidate - however, are waiting to see how she recovers after weaning of sedation and extubation. Pacer seems necessary at this point, due to intermittent bradycardia. Continue heparin and aspirin for severe CAD. 2. A-fib with RVR - intermittent with elevated troponins. Suspect ischemic etiology. She is currently V-paced at 60 from a temporary pacing wire placed yesterday. Will try to wean pacer. On heparin, will need to continue. 3. Cardiomyopathy - clear ischemic etiology. LVEF 35-40% - CABG is indicated based on cath findings.  CVP this am was 12.  Would aim to keep CVP<15 - I's/O's indicate she is probably retaining fluid. About 1L negative yesterday  with diuresis. 4. Pneumonia - was on antibiotics - not currently 5. Hypertension - now on levophed due to relative hypotension in the setting of recent arrest - keep MAP >70 6. Possible anoxic brain injury - EEG showed moderate encephalopathy - no sign of seizure activity. Some purposeful movements. Failed SBT this morning - need to keep working on weaning sedation - work toward extubation. 7. Hypokalemia- resolved. 8. Bradycardia - wean pacer back up rate - decreased to 45 today. Working toward removing pacer.   CRITICAL CARE:  The patient is critically ill with multi-organ system failure and requires high complexity decision making for assessment and support, frequent evaluation and titration of therapies, application of advanced monitoring technologies and extensive interpretation of multiple databases.  Time Spent Directly with Patient:  45 minutes  Length of Stay:  LOS: 8 days   [name], MD, [facility] Attending Cardiologist [facility]  Mariela Rex C 11/07/2014, 8:20 AM

## 2014-11-07 NOTE — Progress Notes (Signed)
PULMONARY / CRITICAL CARE MEDICINE   Name: [name] [name] MRN: [id number] DOB: May 19, 1946    ADMISSION DATE:  10/29/2014 CONSULTATION DATE:  11/03/2014  REFERRING MD :  Hartford Poli  CHIEF COMPLAINT:  Arrest  INITIAL PRESENTATION:   69 year old female with PMH  which is significant for HTN and asthma. She presented to [facility] ED 3/16 c/o chest tightness and weakness x 1 week. In ED she was found to be in new onset  AFib with RVR. Notably she recently had URI symptoms and had been taking OTC cold medications. She was started on dilt gtt and admitted. She was seen by cardiology on day of admission who recommended anticoagulation. Echo showed a new cardiomyopathy with EF 35-45% and diffuse hypokineses. Troponin was mildly elevated but was a flat trend and she was scheduled for LHC on 3/18, however she refused due to a reported IV dye allergy, when she got to the cath lab she became dyspneic which appeared to have an anxiety component and cath as pushed to the following Monday 3/21. She was taken for cath 3/21 but again developed acute onset SOB and tachypnea. She felt as though she could not breathe, however sats remained 100% on [place]. She progressed to have a cardiac arrest, which those present at the time likely started as a respiratory arrest. She underwent ACLS for 5-10 mins. PEA resolved with atropine and epi x 1. No purposeful movement.interaction post arrest. PCCM called 11/03/14    STUDIES and EVENS  Echo > LVEF 35-40 %, severe hypokinesis of inferolateral myocardium. Decreased diastolic compliance. PA press 35mm/Hg.  3/16 > admitted to [facility] with new onset Afib RVR and cardiomyopathy 3/18 > was taken for cath which was postponed due to anxiety and resp. distress.  3/21 > to cath lab, again developed SOB > cardiac arrest 10 mins > intubated . To ICU, hypothermia started CTA chest 3/21 > CT head 3/21 > no acute intracranial abnormality 3./22/16: Intolerant t hypothermia and converted to normothermia  protocol. Cath lab: 80% distal left main coronary artery stenosis With moderate diffuse left ventricular systolic dysfunction, ejection fraction estimated 35-40%. Right heart catheterization was not performed. Possible CABG candidate depending on current medical recovery 11/05/14: Being rewarmed. On levophed low dose. Off nimbex. Wil come off fent/versed gtt shortly. EEG abnormal 11/04/14   SUBJECTIVE/OVERNIGHT/INTERVAL HX  afebrile on precedex gtt only - follows commands per RN when precedex lowered Good UO   VITAL SIGNS: Temp:  [96.4 F (35.8 C)-99.5 F (37.5 C)] 98.1 F (36.7 C) (03/25 0700) Pulse Rate:  [43-70] 59 (03/25 0700) Resp:  [16-38] 18 (03/25 0307) BP: (99-183)/(48-81) 122/67 mmHg (03/25 0600) SpO2:  [90 %-100 %] 100 % (03/25 0700) Arterial Line BP: (113-177)/(61-86) 133/74 mmHg (03/25 0700) FiO2 (%):  [40 %] 40 % (03/25 0307) Weight:  [101.9 kg (224 lb 10.4 oz)] 101.9 kg (224 lb 10.4 oz) (03/25 0407) HEMODYNAMICS: CVP:  [10 mmHg-29 mmHg] 11 mmHg VENTILATOR SETTINGS: Vent Mode:  [-] PRVC FiO2 (%):  [40 %] 40 % Set Rate:  [16 bmp-28 bmp] 16 bmp Vt Set:  [500 mL] 500 mL PEEP:  [5 cmH20] 5 cmH20 Plateau Pressure:  [15 cmH20-17 cmH20] 15 cmH20 INTAKE / OUTPUT:  Intake/Output Summary (Last 24 hours) at 11/07/14 0715 Last data filed at 11/07/14 0700  Gross per 24 hour  Intake 2262.5 ml  Output   2190 ml  Net   72.5 ml    PHYSICAL EXAMINATION: General:  Obese female in NAD on vent Neuro:  RASS -4, on precedex HEENT:  [place]/AT, PERRL, no JVD noted Cardiovascular:  Irreg irreg, brady, no MRG Lungs: sync with vent Abdomen:  Soft, non-distended, + BS Musculoskeletal:  No acute deformity of ROM limitation Skin:  Grossly intact  LABS:  PULMONARY  Recent Labs Lab 11/03/14 1830 11/03/14 1956 11/03/14 2208 11/04/14 1045 11/04/14 1415  PHART 7.239*  --   --  7.277* 7.446  PCO2ART 46.6*  --   --  47.6* 27.6*  PO2ART 202.0*  --   --  144.0* 157.0*  HCO3 19.4*  --    --  21.5 18.7*  TCO2 20.9 18 18  23.0 19.5  O2SAT 98.6  --   --  98.3 98.9    CBC  Recent Labs Lab 11/05/14 0400 11/06/14 0400 11/07/14 0420  HGB 13.1 12.4 12.0  HCT 39.0 37.2 35.8*  WBC 13.1* 10.7* 9.2  PLT 110* 91* 84*    COAGULATION  Recent Labs Lab 11/02/14 1349 11/03/14 1820 11/04/14 0355  INR 1.08 1.17 1.15    CARDIAC    Recent Labs Lab 11/03/14 1820 11/04/14 11/04/14 0740  TROPONINI 0.08* 0.25* 0.19*   No results for input(s): PROBNP in the last 168 hours.   CHEMISTRY  Recent Labs Lab 11/04/14 0740  11/05/14 0400 11/05/14 0805 11/05/14 1538 11/06/14 0400 11/06/14 2148 11/07/14 0420  NA 142  < > 141 143 142 140 143 141  K 4.0  < > 3.4* 3.1* 3.4* 3.0* 3.5 3.8  CL 112  < > 115* 119* 116* 116* 117* 115*  CO2 25  < > 19 20 22  16* 21 19  GLUCOSE 150*  < > 104* 95 119* 111* 124* 136*  BUN 19  < > 15 15 20 19 18 21   CREATININE 1.13*  < > 1.04 1.09 1.21* 1.10 1.12* 1.13*  CALCIUM 8.2*  < > 8.3* 7.8* 8.6 8.4 8.5 8.7  MG 2.2  --  1.7  --   --  1.8 2.1 2.1  PHOS 4.7*  --  2.5  --   --  3.5  --  3.9  < > = values in this interval not displayed. Estimated Creatinine Clearance: 58.2 mL/min (by C-G formula based on Cr of 1.13).   LIVER  Recent Labs Lab 11/01/14 0425 11/02/14 0831 11/02/14 1349 11/03/14 0441 11/03/14 1820 11/04/14 0355  AST 19 17  --  18  --   --   ALT 15 19  --  21  --   --   ALKPHOS 96 89  --  90  --   --   BILITOT 0.6 0.5  --  0.9  --   --   PROT 7.8 7.4  --  6.7  --   --   ALBUMIN 3.6 3.5  --  3.6  --   --   INR  --   --  1.08  --  1.17 1.15     INFECTIOUS  Recent Labs Lab 11/04/14 0355  LATICACIDVEN 1.4     ENDOCRINE CBG (last 3)   Recent Labs  11/06/14 1246 11/06/14 1613 11/06/14 2031  GLUCAP 113* 116* 114*         IMAGING x48h Dg Chest Port 1 View  11/06/2014   CLINICAL DATA:  Pulled endotracheal tube  EXAM: PORTABLE CHEST - 1 VIEW  COMPARISON:  11/05/2011  FINDINGS: Endotracheal tube  terminates above the thoracic inlet, 12.5 cm above the carina.  Patchy bilateral lower lobe opacities, possibly atelectasis with small pleural effusions. Mild  perihilar edema. No pneumothorax.  Cardiomegaly.  Right IJ venous catheter terminates in the lower SVC.  IMPRESSION: Endotracheal tube terminates above the thoracic inlet, 12.5 cm above the carina.  Otherwise unchanged.   Electronically Signed   By: Julian Hy M.D.   On: 11/06/2014 13:30       ASSESSMENT / PLAN:  PULMONARY OETT 3/21 >>> A: Acute respiratory failure s/p cardiac arrest   - does not meet sbt criteria due to encephalopathy  P:   SBTs with goal extubation if good ABG on PS 5/5 VAP bundle  CARDIOVASCULAR CVL 3/21 >>> A:  Cardiac arrest,   - Cardiomyopathy LVEF 35-45% severe HK inferior, diffuse HK  - New Onset AF RVR  - Cath with severe CAD 11/04/14    - Off levophed as of 11/05/14   P:  Cards following PRN hydralazine to keep SBP < 169mm/Hg Holding amio in setting bradycardia Holding norvasc   RENAL A:   AKI -normal creat 11/05/2014,    K low and mag low -repleted  P:   Lasix 40 x 1 Strict I&O Follow Bmet Replace electrolytes as indicated  GASTROINTESTINAL A:   No acute issues  P:   NPO PPI Hold tube feeds during wean  HEMATOLOGIC A:   No acute issues  P:  Follow cbc Heparin gtt for AF per pharmacy  INFECTIOUS  A:   ? CAP - ABX stopped by primary  P:   Monitor clinically  ENDOCRINE A:   No acute issues  P:   Follow glucose on chemistry Add SSI if consistently greater than 180.  NEUROLOGIC A:   Acute metabolic encephalopathy 1/69 -follows commands per RN, on precedex. No myoclonus. EEG abnormal but done under sedation   P:   Precedex gtt + fent prn Dc fent and versed gtt from [facility] Check neuron specific enolase 11/06/14     FAMILY  - Updates: a brother, and a woman named [place] - half sister of DPOA daughter from [place] . There is some intra-family tension.  daughter from [place] updated 3/24  - Inter-disciplinary family meet or Palliative Care meeting due by:  3/28   The patient is critically ill with multiple organ systems failure and requires high complexity decision making for assessment and support, frequent evaluation and titration of therapies, application of advanced monitoring technologies and extensive interpretation of multiple databases.   Critical Care Time devoted to patient care services described in this note is  32  Minutes.  Kara Mead MD. Sh[facility] Flood. [facility] Pager [phone] If no response call [phone]    11/07/2014 7:15 AM

## 2014-11-07 NOTE — Procedures (Signed)
Extubation Procedure Note  Patient Details:   Name: [name] [name] DOB: August 19, 1945 MRN: [id number]   Airway Documentation:     Evaluation  O2 sats: stable throughout Complications: No apparent complications Patient did tolerate procedure well. Bilateral Breath Sounds: Clear Suctioning: Airway Yes   Patient extubated to 2L nasal cannula per MD order.  Positive cuff leak noted.  No evidence of stridor.  Sats currently 100%.  Vitals are stable.  No apparent complications.    Philomena Doheny 11/07/2014, 2:17 PM

## 2014-11-07 NOTE — Progress Notes (Signed)
150cc of fentanyl wasted in the sink witness by [employer]

## 2014-11-07 NOTE — Progress Notes (Signed)
[place] Progress Note Patient Name: [name] [name] DOB: Jun 24, 1946 MRN: [id number]   Date of Service  11/07/2014  HPI/Events of Note  Recurrent AFRVR with hypertension  eICU Interventions  Diltiazem gtt     Intervention Category Major Interventions: Arrhythmia - evaluation and management  Merton Border 11/07/2014, 6:57 PM

## 2014-11-07 NOTE — Progress Notes (Signed)
[place] Progress Note Patient Name: [name] [name] DOB: [birth year] MRN: [id number]   Date of Service  11/07/2014  HPI/Events of Note  210/77  eICU Interventions  PRN labetalol Clonidine patch     Intervention Category Major Interventions: Hypertension - evaluation and management  Merton Border 11/07/2014, 6:13 PM

## 2014-11-07 NOTE — Progress Notes (Signed)
ANTICOAGULATION CONSULT NOTE - Follow Up Consult  Pharmacy Consult for Heparin  Indication: atrial fibrillation  Allergies  Allergen Reactions  . Bee Venom Anaphylaxis  . Shrimp [Shellfish Allergy] Swelling    Patient Measurements: Height: 5' 7.5" (171.5 cm) Weight: 224 lb 10.4 oz (101.9 kg) IBW/kg (Calculated) : 62.75  Vital Signs: Temp: 98.8 F (37.1 C) (03/25 0821) Temp Source: Core (Comment) (03/25 0400) BP: 158/79 mmHg (03/25 0821) Pulse Rate: 59 (03/25 0821)  Labs:  Recent Labs  11/05/14 0400  11/05/14 1230  11/06/14 0400 11/06/14 2148 11/07/14 0420  HGB 13.1  --   --   --  12.4  --  12.0  HCT 39.0  --   --   --  37.2  --  35.8*  PLT 110*  --   --   --  91*  --  84*  HEPARINUNFRC 0.41  --  0.47  --  0.38  --  0.30  CREATININE 1.04  < >  --   < > 1.10 1.12* 1.13*  < > = values in this interval not displayed.  Estimated Creatinine Clearance: 58.2 mL/min (by C-G formula based on Cr of 1.13).   Assessment: 69 year old female on IV heparin (apixaban on hold) for atrial fibrillation. Unable to tolerate cath on 11-17-2022, pt coded in cath lab.Initiated on [employer]. Severe bradycardia 11/17/22 and patient was rewarmed to 36 degrees.   S/p cath 3/22 found to have multivessel CAD. Surgery feels taht she is currently not a surgical candidate give sedation and pressor requirements. Plan is to continue medical management and reassess as neuro function recovers and patient is more stable.   Will continue heparin now for new afib and severe CAD. Patient currently V-paced. Heparin continues to be at goal although on low end on 1200 units/hr, no bleeding issues noted, hgb trended down this morning and pltc is also down today to 84. Will continue to follow trend, if continues to trend down will need to consider checking if heparin allergy.   Goal of Therapy:  Heparin level 0.3-0.7 units/ml Monitor platelets by anticoagulation protocol: Yes   Plan:  -Increase heparin to 1300  units/hr -Daily CBC/HL -Monitor for bleeding  Erin Hearing PharmD., BCPS Clinical Pharmacist Pager [phone] 11/07/2014 9:10 AM

## 2014-11-08 ENCOUNTER — Inpatient Hospital Stay (HOSPITAL_COMMUNITY): Payer: Commercial Managed Care - HMO

## 2014-11-08 DIAGNOSIS — I10 Essential (primary) hypertension: Secondary | ICD-10-CM | POA: Diagnosis present

## 2014-11-08 LAB — CBC WITH DIFFERENTIAL/PLATELET
Basophils Absolute: 0 10*3/uL (ref 0.0–0.1)
Basophils Relative: 0 % (ref 0–1)
Eosinophils Absolute: 0 10*3/uL (ref 0.0–0.7)
Eosinophils Relative: 0 % (ref 0–5)
HCT: 36.7 % (ref 36.0–46.0)
Hemoglobin: 12 g/dL (ref 12.0–15.0)
Lymphocytes Relative: 15 % (ref 12–46)
Lymphs Abs: 1.7 10*3/uL (ref 0.7–4.0)
MCH: 28.8 pg (ref 26.0–34.0)
MCHC: 32.7 g/dL (ref 30.0–36.0)
MCV: 88.2 fL (ref 78.0–100.0)
Monocytes Absolute: 1.1 10*3/uL — ABNORMAL HIGH (ref 0.1–1.0)
Monocytes Relative: 10 % (ref 3–12)
Neutro Abs: 8.5 10*3/uL — ABNORMAL HIGH (ref 1.7–7.7)
Neutrophils Relative %: 75 % (ref 43–77)
Platelets: 92 10*3/uL — ABNORMAL LOW (ref 150–400)
RBC: 4.16 MIL/uL (ref 3.87–5.11)
RDW: 16.4 % — ABNORMAL HIGH (ref 11.5–15.5)
WBC: 11.3 10*3/uL — ABNORMAL HIGH (ref 4.0–10.5)

## 2014-11-08 LAB — HEPARIN LEVEL (UNFRACTIONATED)
Heparin Unfractionated: 0.2 IU/mL — ABNORMAL LOW (ref 0.30–0.70)
Heparin Unfractionated: 0.29 IU/mL — ABNORMAL LOW (ref 0.30–0.70)
Heparin Unfractionated: 0.4 IU/mL (ref 0.30–0.70)

## 2014-11-08 LAB — PHOSPHORUS: Phosphorus: 3.5 mg/dL (ref 2.3–4.6)

## 2014-11-08 LAB — BASIC METABOLIC PANEL
Anion gap: 10 (ref 5–15)
BUN: 20 mg/dL (ref 6–23)
CO2: 23 mmol/L (ref 19–32)
Calcium: 8.8 mg/dL (ref 8.4–10.5)
Chloride: 111 mmol/L (ref 96–112)
Creatinine, Ser: 1.1 mg/dL (ref 0.50–1.10)
GFR calc Af Amer: 58 mL/min — ABNORMAL LOW (ref >90)
GFR calc non Af Amer: 50 mL/min — ABNORMAL LOW (ref >90)
Glucose, Bld: 122 mg/dL — ABNORMAL HIGH (ref 70–99)
Potassium: 3.4 mmol/L — ABNORMAL LOW (ref 3.5–5.1)
Sodium: 144 mmol/L (ref 135–145)

## 2014-11-08 LAB — GLUCOSE, CAPILLARY
Glucose-Capillary: 113 mg/dL — ABNORMAL HIGH (ref 70–99)
Glucose-Capillary: 97 mg/dL (ref 70–99)
Glucose-Capillary: 133 mg/dL — ABNORMAL HIGH (ref 70–99)
Glucose-Capillary: 113 mg/dL — ABNORMAL HIGH (ref 70–99)
Glucose-Capillary: 114 mg/dL — ABNORMAL HIGH (ref 70–99)
Glucose-Capillary: 116 mg/dL — ABNORMAL HIGH (ref 70–99)
Glucose-Capillary: 135 mg/dL — ABNORMAL HIGH (ref 70–99)

## 2014-11-08 LAB — MAGNESIUM: Magnesium: 1.8 mg/dL (ref 1.5–2.5)

## 2014-11-08 MED ORDER — MAGNESIUM SULFATE 2 GM/50ML IV SOLN
2.0000 g | Freq: Once | INTRAVENOUS | Status: AC
  Administered 2014-11-08: 2 g via INTRAVENOUS
  Filled 2014-11-08: qty 50

## 2014-11-08 MED ORDER — SODIUM CHLORIDE 0.9 % IV SOLN
INTRAVENOUS | Status: DC | PRN
  Administered 2014-11-08: 15:00:00 via INTRAVENOUS

## 2014-11-08 MED ORDER — LISINOPRIL 20 MG PO TABS
20.0000 mg | ORAL_TABLET | Freq: Every day | ORAL | Status: DC
  Administered 2014-11-08 – 2014-11-09 (×2): 20 mg via ORAL
  Filled 2014-11-08 (×3): qty 1

## 2014-11-08 MED ORDER — RESOURCE THICKENUP CLEAR PO POWD
ORAL | Status: DC | PRN
  Filled 2014-11-08: qty 125

## 2014-11-08 MED ORDER — WHITE PETROLATUM GEL
Status: DC | PRN
  Administered 2014-11-08: 0.2 via TOPICAL
  Filled 2014-11-08 (×2): qty 28.35
  Filled 2014-11-08: qty 1
  Filled 2014-11-08: qty 28.35

## 2014-11-08 MED ORDER — POTASSIUM CHLORIDE 10 MEQ/50ML IV SOLN
10.0000 meq | INTRAVENOUS | Status: AC
  Administered 2014-11-08 (×4): 10 meq via INTRAVENOUS
  Filled 2014-11-08 (×4): qty 50

## 2014-11-08 NOTE — Progress Notes (Signed)
Pt continues to V-paced on few occasions, Diltiazem gtt at 5mg /hr, HR 80-90's. Dr Debara Pickett paged and temp pacer will not be removed today. Reassess in the am.

## 2014-11-08 NOTE — Progress Notes (Signed)
DAILY PROGRESS NOTE  Subjective:  Extubated yesterday - went into a-fib with RVR. Place on a dilt drip. More awake this morning and seems mostly oriented (person, place, year). Temporary pacer remains in place with back-up rate at 45.  Objective:   Temp:  [98.2 F (36.8 C)-100 F (37.8 C)] 98.2 F (36.8 C) (03/26 0600) Pulse Rate:  [47-132] 56 (03/26 0600) Resp:  [20-26] 20 (03/25 1619) BP: (123-196)/(58-136) 150/77 mmHg (03/26 0600) SpO2:  [99 %-100 %] 100 % (03/26 0600) Arterial Line BP: (135-197)/(63-86) 158/67 mmHg (03/25 2300) FiO2 (%):  [40 %] 40 % (03/25 1215) Weight:  [221 lb 5.5 oz (100.4 kg)] 221 lb 5.5 oz (100.4 kg) (03/26 0222) Weight change: -3 lb 4.9 oz (-1.5 kg)  Intake/Output from previous day: 03/25 0701 - 03/26 0700 In: 6295 [I.V.:1554] Out: 2175 [Urine:2175]  Intake/Output from this shift:    Medications: Current Facility-Administered Medications  Medication Dose Route Frequency Provider Last Rate Last Dose  . 0.9 %  sodium chloride infusion   Intravenous Continuous Troy Sine, MD 50 mL/hr at 11/07/14 1625    . acetaminophen (TYLENOL) tablet 650 mg  650 mg Oral Q4H PRN Troy Sine, MD      . antiseptic oral rinse (CPC / CETYLPYRIDINIUM CHLORIDE 0.05%) solution 7 mL  7 mL Mouth Rinse QID Corey Harold, NP   7 mL at 11/08/14 0400  . aspirin EC tablet 81 mg  81 mg Oral Daily Troy Sine, MD   81 mg at 11/07/14 0858  . atorvastatin (LIPITOR) tablet 80 mg  80 mg Oral q1800 Troy Sine, MD   80 mg at 11/06/14 1753  . chlorhexidine (PERIDEX) 0.12 % solution 15 mL  15 mL Mouth Rinse BID Corey Harold, NP   15 mL at 11/07/14 1947  . cloNIDine (CATAPRES - Dosed in mg/24 hr) patch 0.2 mg  0.2 mg Transdermal Weekly Wilhelmina Mcardle, MD   0.2 mg at 11/07/14 1846  . diltiazem (CARDIZEM) 100 mg in dextrose 5 % 100 mL (1 mg/mL) infusion  5-15 mg/hr Intravenous Titrated Wilhelmina Mcardle, MD 10 mL/hr at 11/08/14 0053 10 mg/hr at 11/08/14 0053  . docusate  (COLACE) 50 MG/5ML liquid 100 mg  100 mg Oral Daily Brand Males, MD   100 mg at 11/07/14 0858  . fentaNYL (SUBLIMAZE) injection 100 mcg  100 mcg Intravenous Q1H PRN Brand Males, MD   50 mcg at 11/07/14 1846  . heparin ADULT infusion 100 units/mL (25000 units/250 mL)  1,500 Units/hr Intravenous Continuous Laren Everts, RPH 15 mL/hr at 11/08/14 0619 1,500 Units/hr at 11/08/14 0619  . hydrALAZINE (APRESOLINE) injection 10-40 mg  10-40 mg Intravenous Q4H PRN Corey Harold, NP   20 mg at 11/07/14 1745  . insulin aspart (novoLOG) injection 0-15 Units  0-15 Units Subcutaneous 6 times per day Juanito Doom, MD   2 Units at 11/07/14 1202  . labetalol (NORMODYNE,TRANDATE) injection 20 mg  20 mg Intravenous Q1H PRN Wilhelmina Mcardle, MD   20 mg at 11/08/14 0107  . magnesium sulfate IVPB 2 g 50 mL  2 g Intravenous Once Mauri Brooklyn, MD      . ondansetron [facility]) injection 4 mg  4 mg Intravenous Q6H PRN Troy Sine, MD      . pantoprazole (PROTONIX) injection 40 mg  40 mg Intravenous Q24H Corey Harold, NP   40 mg at 11/07/14 1707  . potassium chloride 10 mEq in  50 mL *CENTRAL LINE* IVPB  10 mEq Intravenous Q1 Hr x 4 Mauri Brooklyn, MD        Physical Exam: General appearance: alert, no distress and mildly confused Neck: no carotid bruit and no JVD Lungs: wheezes LUL and RUL Heart: irregularly irregular rhythm and no murmur Abdomen: soft, non-tender; bowel sounds normal; no masses,  no organomegaly Extremities: extremities normal, atraumatic, no cyanosis or edema Pulses: 2+ and symmetric Skin: Skin color, texture, turgor normal. No rashes or lesions Neurologic: Mental status: Alert and oriented, somewhat slow reactions, but following commands Psych: Pleasant  Lab Results: Results for orders placed or performed during the hospital encounter of 10/29/14 (from the past 48 hour(s))  Glucose, capillary     Status: Abnormal   Collection Time: 11/06/14 12:46 PM  Result Value Ref Range    Glucose-Capillary 113 (H) 70 - 99 mg/dL   Comment 1 Arterial Specimen   Glucose, capillary     Status: Abnormal   Collection Time: 11/06/14  4:13 PM  Result Value Ref Range   Glucose-Capillary 116 (H) 70 - 99 mg/dL   Comment 1 Arterial Specimen   Glucose, capillary     Status: Abnormal   Collection Time: 11/06/14  8:31 PM  Result Value Ref Range   Glucose-Capillary 114 (H) 70 - 99 mg/dL   Comment 1 Arterial Specimen   Basic metabolic panel     Status: Abnormal   Collection Time: 11/06/14  9:48 PM  Result Value Ref Range   Sodium 143 135 - 145 mmol/L   Potassium 3.5 3.5 - 5.1 mmol/L   Chloride 117 (H) 96 - 112 mmol/L   CO2 21 19 - 32 mmol/L   Glucose, Bld 124 (H) 70 - 99 mg/dL   BUN 18 6 - 23 mg/dL   Creatinine, Ser 1.12 (H) 0.50 - 1.10 mg/dL   Calcium 8.5 8.4 - 10.5 mg/dL   GFR calc non Af Amer 49 (L) >90 mL/min   GFR calc Af Amer 57 (L) >90 mL/min    Comment: (NOTE) The eGFR has been calculated using the CKD EPI equation. This calculation has not been validated in all clinical situations. eGFR's persistently <90 mL/min signify possible Chronic Kidney Disease.    Anion gap 5 5 - 15  Magnesium     Status: None   Collection Time: 11/06/14  9:48 PM  Result Value Ref Range   Magnesium 2.1 1.5 - 2.5 mg/dL  Magnesium     Status: None   Collection Time: 11/07/14  4:20 AM  Result Value Ref Range   Magnesium 2.1 1.5 - 2.5 mg/dL  Phosphorus     Status: None   Collection Time: 11/07/14  4:20 AM  Result Value Ref Range   Phosphorus 3.9 2.3 - 4.6 mg/dL  Basic metabolic panel     Status: Abnormal   Collection Time: 11/07/14  4:20 AM  Result Value Ref Range   Sodium 141 135 - 145 mmol/L   Potassium 3.8 3.5 - 5.1 mmol/L   Chloride 115 (H) 96 - 112 mmol/L   CO2 19 19 - 32 mmol/L   Glucose, Bld 136 (H) 70 - 99 mg/dL   BUN 21 6 - 23 mg/dL   Creatinine, Ser 1.13 (H) 0.50 - 1.10 mg/dL   Calcium 8.7 8.4 - 10.5 mg/dL   GFR calc non Af Amer 48 (L) >90 mL/min   GFR calc Af Amer 56 (L)  >90 mL/min    Comment: (NOTE) The eGFR has been calculated  using the CKD EPI equation. This calculation has not been validated in all clinical situations. eGFR's persistently <90 mL/min signify possible Chronic Kidney Disease.    Anion gap 7 5 - 15  CBC with Differential/Platelet     Status: Abnormal   Collection Time: 11/07/14  4:20 AM  Result Value Ref Range   WBC 9.2 4.0 - 10.5 K/uL   RBC 4.08 3.87 - 5.11 MIL/uL   Hemoglobin 12.0 12.0 - 15.0 g/dL   HCT 35.8 (L) 36.0 - 46.0 %   MCV 87.7 78.0 - 100.0 fL   MCH 29.4 26.0 - 34.0 pg   MCHC 33.5 30.0 - 36.0 g/dL   RDW 16.5 (H) 11.5 - 15.5 %   Platelets 84 (L) 150 - 400 K/uL    Comment: CONSISTENT WITH PREVIOUS RESULT   Neutrophils Relative % 80 (H) 43 - 77 %   Neutro Abs 7.3 1.7 - 7.7 K/uL   Lymphocytes Relative 12 12 - 46 %   Lymphs Abs 1.1 0.7 - 4.0 K/uL   Monocytes Relative 7 3 - 12 %   Monocytes Absolute 0.7 0.1 - 1.0 K/uL   Eosinophils Relative 0 0 - 5 %   Eosinophils Absolute 0.0 0.0 - 0.7 K/uL   Basophils Relative 0 0 - 1 %   Basophils Absolute 0.0 0.0 - 0.1 K/uL  Heparin level (unfractionated)     Status: None   Collection Time: 11/07/14  4:20 AM  Result Value Ref Range   Heparin Unfractionated 0.30 0.30 - 0.70 IU/mL    Comment:        IF HEPARIN RESULTS ARE BELOW EXPECTED VALUES, AND PATIENT DOSAGE HAS BEEN CONFIRMED, SUGGEST FOLLOW UP TESTING OF ANTITHROMBIN III LEVELS.   Glucose, capillary     Status: Abnormal   Collection Time: 11/07/14  8:43 AM  Result Value Ref Range   Glucose-Capillary 145 (H) 70 - 99 mg/dL   Comment 1 Capillary Specimen   CBC     Status: Abnormal   Collection Time: 11/07/14 10:50 AM  Result Value Ref Range   WBC 10.9 (H) 4.0 - 10.5 K/uL    Comment: REPEATED TO VERIFY   RBC 4.12 3.87 - 5.11 MIL/uL   Hemoglobin 12.1 12.0 - 15.0 g/dL    Comment: CONSISTENT WITH PREVIOUS RESULT   HCT 36.3 36.0 - 46.0 %   MCV 88.1 78.0 - 100.0 fL   MCH 29.4 26.0 - 34.0 pg   MCHC 33.3 30.0 - 36.0 g/dL     RDW 16.4 (H) 11.5 - 15.5 %   Platelets 87 (L) 150 - 400 K/uL    Comment: SPECIMEN CHECKED FOR CLOTS REPEATED TO VERIFY CONSISTENT WITH PREVIOUS RESULT   Glucose, capillary     Status: Abnormal   Collection Time: 11/07/14 11:14 AM  Result Value Ref Range   Glucose-Capillary 149 (H) 70 - 99 mg/dL   Comment 1 Capillary Specimen   I-STAT 3, arterial blood gas (G3+)     Status: Abnormal   Collection Time: 11/07/14  1:04 PM  Result Value Ref Range   pH, Arterial 7.378 7.350 - 7.450   pCO2 arterial 32.2 (L) 35.0 - 45.0 mmHg   pO2, Arterial 79.0 (L) 80.0 - 100.0 mmHg   Bicarbonate 19.0 (L) 20.0 - 24.0 mEq/L   TCO2 20 0 - 100 mmol/L   O2 Saturation 96.0 %   Acid-base deficit 5.0 (H) 0.0 - 2.0 mmol/L   Patient temperature 98.6 F    Collection site ARTERIAL LINE  Drawn by Operator    Sample type ARTERIAL   Glucose, capillary     Status: Abnormal   Collection Time: 11/07/14  4:19 PM  Result Value Ref Range   Glucose-Capillary 120 (H) 70 - 99 mg/dL   Comment 1 Capillary Specimen   Glucose, capillary     Status: Abnormal   Collection Time: 11/07/14  7:47 PM  Result Value Ref Range   Glucose-Capillary 105 (H) 70 - 99 mg/dL  Magnesium     Status: None   Collection Time: 11/08/14  5:00 AM  Result Value Ref Range   Magnesium 1.8 1.5 - 2.5 mg/dL  Phosphorus     Status: None   Collection Time: 11/08/14  5:00 AM  Result Value Ref Range   Phosphorus 3.5 2.3 - 4.6 mg/dL  Basic metabolic panel     Status: Abnormal   Collection Time: 11/08/14  5:00 AM  Result Value Ref Range   Sodium 144 135 - 145 mmol/L   Potassium 3.4 (L) 3.5 - 5.1 mmol/L   Chloride 111 96 - 112 mmol/L   CO2 23 19 - 32 mmol/L   Glucose, Bld 122 (H) 70 - 99 mg/dL   BUN 20 6 - 23 mg/dL   Creatinine, Ser 1.10 0.50 - 1.10 mg/dL   Calcium 8.8 8.4 - 10.5 mg/dL   GFR calc non Af Amer 50 (L) >90 mL/min   GFR calc Af Amer 58 (L) >90 mL/min    Comment: (NOTE) The eGFR has been calculated using the CKD EPI equation. This  calculation has not been validated in all clinical situations. eGFR's persistently <90 mL/min signify possible Chronic Kidney Disease.    Anion gap 10 5 - 15  CBC with Differential/Platelet     Status: Abnormal (Preliminary result)   Collection Time: 11/08/14  5:00 AM  Result Value Ref Range   WBC 11.3 (H) 4.0 - 10.5 K/uL   RBC 4.16 3.87 - 5.11 MIL/uL   Hemoglobin 12.0 12.0 - 15.0 g/dL   HCT 36.7 36.0 - 46.0 %   MCV 88.2 78.0 - 100.0 fL   MCH 28.8 26.0 - 34.0 pg   MCHC 32.7 30.0 - 36.0 g/dL   RDW 16.4 (H) 11.5 - 15.5 %   Platelets PENDING 150 - 400 K/uL   Neutrophils Relative % 75 43 - 77 %   Neutro Abs 8.5 (H) 1.7 - 7.7 K/uL   Lymphocytes Relative 15 12 - 46 %   Lymphs Abs 1.7 0.7 - 4.0 K/uL   Monocytes Relative 10 3 - 12 %   Monocytes Absolute 1.1 (H) 0.1 - 1.0 K/uL   Eosinophils Relative 0 0 - 5 %   Eosinophils Absolute 0.0 0.0 - 0.7 K/uL   Basophils Relative 0 0 - 1 %   Basophils Absolute 0.0 0.0 - 0.1 K/uL  Heparin level (unfractionated)     Status: Abnormal   Collection Time: 11/08/14  5:00 AM  Result Value Ref Range   Heparin Unfractionated 0.20 (L) 0.30 - 0.70 IU/mL    Comment:        IF HEPARIN RESULTS ARE BELOW EXPECTED VALUES, AND PATIENT DOSAGE HAS BEEN CONFIRMED, SUGGEST FOLLOW UP TESTING OF ANTITHROMBIN III LEVELS.     Imaging: Dg Chest Port 1 View  11/07/2014   CLINICAL DATA:  Endotracheal tube placement  EXAM: PORTABLE CHEST - 1 VIEW  COMPARISON:  Radiograph 11/06/2014  FINDINGS: Endotracheal tube 6 cm from carina in good position. NG tube extends to the stomach. Right  central venous line tip in the distal SVC. Stable enlarged cardiac silhouette. The bilateral pleural effusions and bibasilar edema. No pneumothorax.  IMPRESSION: 1. Endotracheal tube in good position. 2. No change in bilateral pleural effusions and mild basilar edema.   Electronically Signed   By: Suzy Bouchard M.D.   On: 11/07/2014 08:53   Dg Chest Port 1 View  11/06/2014   CLINICAL  DATA:  Pulled endotracheal tube  EXAM: PORTABLE CHEST - 1 VIEW  COMPARISON:  11/05/2011  FINDINGS: Endotracheal tube terminates above the thoracic inlet, 12.5 cm above the carina.  Patchy bilateral lower lobe opacities, possibly atelectasis with small pleural effusions. Mild perihilar edema. No pneumothorax.  Cardiomegaly.  Right IJ venous catheter terminates in the lower SVC.  IMPRESSION: Endotracheal tube terminates above the thoracic inlet, 12.5 cm above the carina.  Otherwise unchanged.   Electronically Signed   By: Julian Hy M.D.   On: 11/06/2014 13:30    Assessment:  Principal Problem:   Cardiac arrest Active Problems:   HTN (hypertension)   Atrial fibrillation   CAP (community acquired pneumonia)   Cardiomyopathy   Hypokalemia   Acute on chronic combined systolic and diastolic HF (heart failure)   NSTEMI (non-ST elevated myocardial infarction)   SOB (shortness of breath)   Atrial fibrillation with RVR   PEA (Pulseless electrical activity)   Acute respiratory failure with hypoxemia   Anoxic brain injury   Essential hypertension   Cardiomyopathy, ischemic   Coronary artery disease due to lipid rich plaque   Plan:  1. Cardiac arrest - Ms. Geffrard had a brady-asystolic arrest. She underwent cardiac catheterization which revealed 80% LM stenosis and multivessel CAD. CT surgery was consulted felt she should have CABG, pending neurologic recovery - she is on aspirin and heparin. Now awake. Will need to re-involve CT surgery 2. A-fib with RVR - intermittent - just recurred last night. On heparin and diltiazem gtts. Discontinue pacer wire today. 3. Cardiomyopathy - clear ischemic etiology. LVEF 35-40% - CABG is indicated based on cath findings. Has been diuresed the past 2 days.  4. Hypertension - BP now elevated - start afterload reductions. 5. Possible anoxic brain injury - extubated and awake - now following commands and seems oriented. 6. Hypokalemia-  repleting. 7. Bradycardia - d/c temporary pacemaker.  Time Spent Directly with Patient:  30 minutes  Length of Stay:  LOS: 9 days   [name] Casino, MD, [facility] Attending Cardiologist [facility]  Kit Mollett C 11/08/2014, 7:48 AM

## 2014-11-08 NOTE — Evaluation (Addendum)
Physical Therapy Evaluation Patient Details Name: [name] [name] MRN: [id number] DOB: 15-Apr-1946 Today's Date: 11/08/2014   History of Present Illness  69 year old female with PMH which is significant for HTN and asthma. She presented to [facility] ED 3/16 c/o chest tightness and weakness x 1 week. In ED she was found to be in new onset AFib with RVR. She was seen by cardiology on day of admission who recommended anticoagulation. Echo showed a new cardiomyopathy with EF 35-45% and diffuse hypokineses. Troponin was mildly elevated but was a flat trend. She was taken for cath 3/21 but again developed acute onset SOB and tachypnea. She felt as though she could not breathe, however sats remained 100% on [place]. She progressed to have a cardiac arrest, which those present at the time likely started as a respiratory arrest. Cath revealed 80% blockages and surgery being consulted to see if pt is a CABG candidate.  On initial PT evaluation pt still has external pacer.  Pt with significant PMHx of HTN, asthma, and arthritis.  Intubation 11/03/14- 11/07/14.  Clinical Impression  Pt is weak and deconditioned due to current medical status, but was independent PTA. She required two people to transfer OOB to the chair for line management and safety.  She did become a bit anxious EOB with increased DOE 3-4/4, O2 sats WNL on 2 L O2 [place].  Therapist and family was able to calm her and encouraged slow, PLB. Pt will likely progress quickly as long as she progresses medically.  Still awaiting word on possible CABG?  We are limited in mobility right now due to right femoral pacing wires (limited hip flexion and mobility only OOB to chair today due to this).  In order to increase her mobility, we will have to have pacing wires removed or moved.   PT to follow acutely for deficits listed below.       Follow Up Recommendations Home health PT;Supervision/Assistance - 24 hour    Equipment Recommendations  Rolling walker with 5" wheels     Recommendations for Other Services OT consult     Precautions / Restrictions Precautions Precautions: Fall;Other (comment) Precaution Comments: R femoral pacing wires, will need them out for incrased activity      Mobility  Bed Mobility Overal bed mobility: Needs Assistance Bed Mobility: Supine to Sit     Supine to sit: Min assist     General bed mobility comments: Min assist to support trunk to get to sitting EOB and help progress bil legs to EOB.    Transfers Overall transfer level: Needs assistance Equipment used: 2 person hand held assist Transfers: Sit to/from [facility] Sit to Stand: From elevated surface;+2 physical assistance;Min assist Stand pivot transfers: +2 physical assistance;Min assist;From elevated surface       General transfer comment: Two person min assist to support trunk and manage lines during stand pivot transfer to the recliner chair.    Ambulation/Gait             General Gait Details: NT due to needing increased medical stability and R femoral pacing wires       Balance Overall balance assessment: Needs assistance Sitting-balance support: Feet supported;Bilateral upper extremity supported Sitting balance-Leahy Scale: Poor Sitting balance - Comments: Min assist at trunk seated EOB.  Pt unable to remove upper extremity support at this time.  Reports of lightheadedness initially upon sitting, however disapated quickly.    Standing balance support: Bilateral upper extremity supported Standing balance-Leahy Scale: Poor Standing  balance comment: needed two person support in standing for balance and line management.  Could likely have been one person if RW used.                              Pertinent Vitals/Pain Pain Assessment: No/denies pain     11/08/14 1157  Vital Signs  Pulse Rate 98  BP (!) 167/95 mmHg  BP Location Left Arm  BP Method Automatic  Patient Position (if appropriate) Lying  Oxygen  Therapy  SpO2 100 %  O2 Device Nasal Cannula  O2 Flow Rate (L/min) 2 L/min    11/08/14 1239  Vital Signs  Pulse Rate 93  BP (!) 161/86 mmHg  BP Location Left Arm  BP Method Automatic  Patient Position (if appropriate) Sitting  Oxygen Therapy  SpO2 100 %  O2 Device Nasal Cannula  O2 Flow Rate (L/min) 2 L/min    Home Living Family/patient expects to be discharged to:: Private residence Living Arrangements: Non-relatives/Friends (roommate) Available Help at Discharge: Family;Friend(s);Available PRN/intermittently Type of Home: House Home Access: Stairs to enter Entrance Stairs-Rails: Right Entrance Stairs-Number of Steps: 3 Home Layout: One level Home Equipment: None      Prior Function Level of Independence: Independent         Comments: 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 )  Cognition Arousal/Alertness: Lethargic Behavior During Therapy: WFL for tasks assessed/performed Overall Cognitive Status: Within Functional Limits for tasks assessed (not specifically tested, recognized her grandson)                      General Comments General comments (skin integrity, edema, etc.): Increased DOE in sitting EOB.  2L O2 [place] still donned and HR (50s-90s with occational pacing) and O2 sats (93-100% on 2 L O2 [place]).  Pt instructed on slow pursed lip breathing to decrease DOE to 1/4 from 3-4/4.  This seemed to be an anxiety componenet to this increased DOE EOB. Family present helping to calm her.  Per grandson she "does not like hospitals".           Assessment/Plan    PT Assessment Patient needs continued PT  services  PT Diagnosis Difficulty walking;Abnormality of gait;Generalized weakness   PT Problem List Decreased strength;Decreased activity tolerance;Decreased balance;Decreased mobility;Decreased knowledge of use of DME;Cardiopulmonary status limiting activity  PT Treatment Interventions DME instruction;Gait training;Stair training;Functional mobility training;Therapeutic activities;Therapeutic exercise;Balance training;Neuromuscular re-education;Patient/family education   PT Goals (Current goals can be found in the Care Plan section) Acute Rehab PT Goals Patient Stated Goal: to get out of here ASAP PT Goal Formulation: With patient Time For Goal Achievement: 11/22/14 Potential to Achieve Goals: Good    Frequency Min 3X/week   Barriers to discharge Decreased caregiver support no family confirmed that they can provide        End of Session Equipment Utilized During Treatment: Gait belt;Oxygen Activity Tolerance: Patient limited by fatigue;Treatment limited secondary to medical complications (Comment) Patient left: in chair;with call bell/phone within reach;with family/visitor present Nurse Communication: Mobility status  Time: 1103-1594 PT Time Calculation (min) (ACUTE ONLY): 42 min   Charges:   PT Evaluation $Initial PT Evaluation Tier I: 1 Procedure PT Treatments $Therapeutic Activity: 8-22 mins        Laker Thompson B. [place], [place], DPT [phone]   11/08/2014, 12:55 PM

## 2014-11-08 NOTE — Progress Notes (Signed)
ANTICOAGULATION CONSULT NOTE - Follow Up Consult  Pharmacy Consult for heparin Indication: atrial fibrillation  Labs:  Recent Labs  11/06/14 0400 11/06/14 2148 11/07/14 0420 11/07/14 1050 11/08/14 0500  HGB 12.4  --  12.0 12.1  --   HCT 37.2  --  35.8* 36.3  --   PLT 91*  --  84* 87*  --   HEPARINUNFRC 0.38  --  0.30  --  0.20*  CREATININE 1.10 1.12* 1.13*  --   --     Assessment: 69yo female now subtherapeutic on heparin despite rate increase to keep within goal.  Goal of Therapy:  Heparin level 0.3-0.7 units/ml   Plan:  Will increase heparin gtt by 2 units/kg/hr to 1500 units/hr and check level in 6hr.  [name] [name], PharmD, BCPS  11/08/2014,6:11 AM

## 2014-11-08 NOTE — Progress Notes (Signed)
[place] Progress Note Patient Name: [name] [name] DOB: 22-Apr-1946 MRN: [id number]   Date of Service  11/08/2014  HPI/Events of Note  Hypokalemia hypomagnesemia  eICU Interventions  replaced     Intervention Category Minor Interventions: Electrolytes abnormality - evaluation and management  Mauri Brooklyn, P 11/08/2014, 7:02 AM

## 2014-11-08 NOTE — Progress Notes (Signed)
ANTICOAGULATION CONSULT NOTE - Follow Up Consult  Pharmacy Consult for heparin Indication: atrial fibrillation  Allergies  Allergen Reactions  . Bee Venom Anaphylaxis  . Shrimp [Shellfish Allergy] Swelling    Patient Measurements: Height: 5' 7.5" (171.5 cm) Weight: 221 lb 5.5 oz (100.4 kg) IBW/kg (Calculated) : 62.75 Heparin Dosing Weight: 85 kg  Vital Signs: Temp: 98.2 F (36.8 C) (03/26 1300) Temp Source: Core (Comment) (03/26 0800) BP: 181/80 mmHg (03/26 1300) Pulse Rate: 92 (03/26 1300)  Labs:  Recent Labs  11/06/14 2148 11/07/14 0420 11/07/14 1050 11/08/14 0500 11/08/14 1200  HGB  --  12.0 12.1 12.0  --   HCT  --  35.8* 36.3 36.7  --   PLT  --  84* 87* 92*  --   HEPARINUNFRC  --  0.30  --  0.20* 0.29*  CREATININE 1.12* 1.13*  --  1.10  --     Estimated Creatinine Clearance: 59.3 mL/min (by C-G formula based on Cr of 1.1).   Medications:  Scheduled:  . antiseptic oral rinse  7 mL Mouth Rinse QID  . aspirin EC  81 mg Oral Daily  . atorvastatin  80 mg Oral q1800  . cloNIDine  0.2 mg Transdermal Weekly  . docusate  100 mg Oral Daily  . insulin aspart  0-15 Units Subcutaneous 6 times per day  . lisinopril  20 mg Oral Daily  . pantoprazole (PROTONIX) IV  40 mg Intravenous Q24H    Assessment: 69 year old female on IV heparin (apixaban on hold) for atrial fibrillation. Unable to tolerate cath on Nov 09, 2022, pt coded in cath lab.Initiated on [employer]. Severe bradycardia Nov 09, 2022 and patient was rewarmed to 36 degrees. S/p cath 3/22 with MVD, not CABG candidate. HL today is slightly subtherapeutic at 0.29 on 1500 units/hr. Hgb 12, 92 (up from 84, 87). No s/s of bleeding per RN.   Goal of Therapy:  Heparin level 0.3-0.7 units/ml Monitor platelets by anticoagulation protocol: Yes   Plan:  Increase heparin to 1600 units/hr 6-hr HL @ 2030 Daily HL Monitor hgb/plts, s/s of bleeding, clinical course  [name] Demond L. [name] [name], PharmD Clinical Pharmacy Resident Pager:  [phone] 11/08/2014 2:18 PM

## 2014-11-08 NOTE — Evaluation (Signed)
Clinical/Bedside Swallow Evaluation Patient Details  Name: [name] [name] MRN: [id number] Date of Birth: 1946/02/20  Today's Date: 11/08/2014 Time: SLP Start Time (ACUTE ONLY): 1340 SLP Stop Time (ACUTE ONLY): 1400 SLP Time Calculation (min) (ACUTE ONLY): 20 min  Past Medical History:  Past Medical History  Diagnosis Date  . Hypertension   . Asthma   . Arthritis    Past Surgical History:  Past Surgical History  Procedure Laterality Date  . Left heart catheterization with coronary angiogram N/A 11/04/2014    Procedure: LEFT HEART CATHETERIZATION WITH CORONARY ANGIOGRAM;  Surgeon: Troy Sine, MD;  Location: [facility] C[facility] LAB;  Service: Cardiovascular;  Laterality: N/A;  . Temporary pacemaker insertion  11/04/2014    Procedure: TEMPORARY PACEMAKER INSERTION;  Surgeon: Troy Sine, MD;  Location: [facility] CATH LAB;  Service: Cardiovascular;;   HPI:  69 y.o. year old female with significant past medical history of HTN, asthma, A-fib admitted with new onset atrial fibrillation and CAP. Per MD note pt also reports URI sxs over past 2-3 weeks. CXR 10/29/14. Repeat CXR revealed small right pleural effusion noted. Bibasilar airspace opacification may reflect pneumonia or possibly mildly asymmetric interstitial edema, right greater than left. This is mildly improved on the left and mildly worsened on the right since the prior study. Required emergent central venous catheter placement 3/21, intubated 3/21-3/25. Barium esophagram 02/14/12 revealed small hiatal hernia with moderate reflux, very mild distal esophageal narrowing.he pill does lodge here temporarily but does pass into the stomach intact.   Assessment / Plan / Recommendation Clinical Impression  Pt exhibited mild oral holding/hesitation with thin liquids likely in preparation for pharyngeal acceptance with recent intubation. Delayed swallow across consistencies with suspected decreased sensation (initiation time increased as eval progressed) and  immediate and delayed throat clears that were mitigated with nectar thick liquids. Pt demonstrated increased work of breathing following trials and required rest breaks due to decreased endurance. Recommend Dys 2 texture and nectar thick liquids, pills whole in applesauce, full supervision due to impulsivity during po's. ST will follow for safety, efficiency and upgrade.    Aspiration Risk  Moderate    Diet Recommendation Dysphagia 2 (Fine chop);Nectar-thick liquid   Liquid Administration via: Cup;No straw Medication Administration: Whole meds with puree Supervision: Full supervision/cueing for compensatory strategies;Patient able to self feed Compensations: Slow rate;Small sips/bites Postural Changes and/or Swallow Maneuvers: Seated upright 90 degrees;Upright 30-60 min after meal    Other  Recommendations Oral Care Recommendations: Oral care BID   Follow Up Recommendations   (TBD)    Frequency and Duration min 2x/week  2 weeks   Pertinent Vitals/Pain None          Swallow Study Prior Functional Status  Type of Home: House Available Help at Discharge: Family;Friend(s);Available PRN/intermittently       Oral/Motor/Sensory Function Overall Oral Motor/Sensory Function: Appears within functional limits for tasks assessed   Ice Chips Ice chips: Not tested   Thin Liquid Thin Liquid: Impaired Presentation: Cup Oral Phase Functional Implications: Oral holding Pharyngeal  Phase Impairments: Suspected delayed Swallow;Throat Clearing - Delayed;Throat Clearing - Immediate    Nectar Thick Nectar Thick Liquid: Impaired Presentation: Cup Pharyngeal Phase Impairments: Suspected delayed Swallow   Honey Thick Honey Thick Liquid: Not tested   Puree Puree: Impaired Pharyngeal Phase Impairments: Suspected delayed Swallow   Solid   GO    Solid: Not tested (pt refused)       Houston Siren 11/08/2014,4:17 PM  Orbie Pyo [place].[employer] [phone]

## 2014-11-08 NOTE — Progress Notes (Signed)
ANTICOAGULATION CONSULT NOTE - Follow Up Consult  Pharmacy Consult for heparin Indication: atrial fibrillation  Allergies  Allergen Reactions  . Bee Venom Anaphylaxis  . Shrimp [Shellfish Allergy] Swelling    Patient Measurements: Height: 5' 7.5" (171.5 cm) Weight: 221 lb 5.5 oz (100.4 kg) IBW/kg (Calculated) : 62.75 Heparin Dosing Weight: 85 kg  Vital Signs: Temp: 97.7 F (36.5 C) (03/26 1943) Temp Source: Oral (03/26 1943) BP: 186/67 mmHg (03/26 1900) Pulse Rate: 95 (03/26 1900)  Labs:  Recent Labs  11/06/14 2148 11/07/14 0420 11/07/14 1050 11/08/14 0500 11/08/14 1200 11/08/14 2020  HGB  --  12.0 12.1 12.0  --   --   HCT  --  35.8* 36.3 36.7  --   --   PLT  --  84* 87* 92*  --   --   HEPARINUNFRC  --  0.30  --  0.20* 0.29* 0.40  CREATININE 1.12* 1.13*  --  1.10  --   --     Estimated Creatinine Clearance: 59.3 mL/min (by C-G formula based on Cr of 1.1).   Medications:  Scheduled:  . antiseptic oral rinse  7 mL Mouth Rinse QID  . aspirin EC  81 mg Oral Daily  . atorvastatin  80 mg Oral q1800  . cloNIDine  0.2 mg Transdermal Weekly  . docusate  100 mg Oral Daily  . insulin aspart  0-15 Units Subcutaneous 6 times per day  . lisinopril  20 mg Oral Daily  . pantoprazole (PROTONIX) IV  40 mg Intravenous Q24H   Infusions:  . sodium chloride 10 mL/hr at 11/08/14 1443  . diltiazem (CARDIZEM) infusion 15 mg/hr (11/08/14 1945)  . heparin 1,600 Units/hr (11/08/14 1954)    Assessment: 69 yo female with afib is currently on therapeutic heparin.  Heparin level is 0.4 Goal of Therapy:  Heparin level 0.3-0.7 units/ml Monitor platelets by anticoagulation protocol: Yes   Plan:  - continue heparin @ 1600 units/hr - heparin level and CBC  Damani Rando, Tsz-Yin 11/08/2014,8:58 PM

## 2014-11-08 NOTE — Progress Notes (Addendum)
PULMONARY / CRITICAL CARE MEDICINE   Name: [name] [name] MRN: [id number] DOB: 10/21/1945    ADMISSION DATE:  10/29/2014 CONSULTATION DATE:  11/03/2014  REFERRING MD :  Hartford Poli  CHIEF COMPLAINT:  Arrest  INITIAL PRESENTATION:   69 year old female with PMH  which is significant for HTN and asthma. She presented to [facility] ED 3/16 c/o chest tightness and weakness x 1 week. In ED she was found to be in new onset  AFib with RVR. Notably she recently had URI symptoms and had been taking OTC cold medications. She was started on dilt gtt and admitted. She was seen by cardiology on day of admission who recommended anticoagulation. Echo showed a new cardiomyopathy with EF 35-45% and diffuse hypokineses. Troponin was mildly elevated but was a flat trend and she was scheduled for LHC on 3/18, however she refused due to a reported IV dye allergy, when she got to the cath lab she became dyspneic which appeared to have an anxiety component and cath as pushed to the following Monday 3/21. She was taken for cath 3/21 but again developed acute onset SOB and tachypnea. She felt as though she could not breathe, however sats remained 100% on [place]. She progressed to have a cardiac arrest, which those present at the time likely started as a respiratory arrest. She underwent ACLS for 5-10 mins. PEA resolved with atropine and epi x 1. No purposeful movement.interaction post arrest. PCCM called 11/03/14    STUDIES and [place]ENS  Echo > LVEF 35-40 %, severe hypokinesis of inferolateral myocardium. Decreased diastolic compliance. PA press 44mm/Hg.  3/16 > admitted to [facility] with new onset Afib RVR and cardiomyopathy 3/18 > was taken for cath which was postponed due to anxiety and resp. distress.  3/21 > to cath lab, again developed SOB > cardiac arrest 10 mins > intubated . To ICU, hypothermia started CTA chest 3/21 > CT head 3/21 > no acute intracranial abnormality 3./22/16: Intolerant t hypothermia and converted to normothermia  protocol. Cath lab: 80% distal left main coronary artery stenosis With moderate diffuse left ventricular systolic dysfunction, ejection fraction estimated 35-40%. Right heart catheterization was not performed. Possible CABG candidate depending on current medical recovery 11/05/14: Being rewarmed. On levophed low dose. Off nimbex. Wil come off fent/versed gtt shortly. EEG abnormal 11/04/14   SUBJECTIVE/OVERNIGHT/INTERVAL HX  afebrile on precedex gtt only - follows commands per RN when precedex lowered Good UO   VITAL SIGNS: Temp:  [98.2 F (36.8 C)-100 F (37.8 C)] 98.2 F (36.8 C) (03/26 0600) Pulse Rate:  [47-132] 56 (03/26 0600) Resp:  [20-26] 20 (03/25 1619) BP: (123-196)/(58-136) 150/77 mmHg (03/26 0600) SpO2:  [99 %-100 %] 100 % (03/26 0600) Arterial Line BP: (135-197)/(63-86) 158/67 mmHg (03/25 2300) FiO2 (%):  [40 %] 40 % (03/25 1215) Weight:  [100.4 kg (221 lb 5.5 oz)] 100.4 kg (221 lb 5.5 oz) (03/26 0222) HEMODYNAMICS: CVP:  [7 mmHg-19 mmHg] 7 mmHg VENTILATOR SETTINGS: Vent Mode:  [-] PSV;CPAP FiO2 (%):  [40 %] 40 % PEEP:  [5 cmH20] 5 cmH20 Pressure Support:  [5 cmH20] 5 cmH20 INTAKE / OUTPUT:  Intake/Output Summary (Last 24 hours) at 11/08/14 0805 Last data filed at 11/08/14 0600  Gross per 24 hour  Intake   1491 ml  Output   2175 ml  Net   -684 ml    PHYSICAL EXAMINATION: General:  Obese female in NAD awake and interactive but hoarse Neuro: Awake and alert, moving all ext to command HEENT:  [place]/AT,  PERRL, no JVD noted Cardiovascular:  Irreg irreg, brady, no MRG Lungs: transmitted upper ar Abdomen:  Soft, non-distended, + BS Musculoskeletal:  No acute deformity of ROM limitation Skin:  Grossly intact  LABS:  PULMONARY  Recent Labs Lab 11/03/14 1830 11/03/14 1956 11/03/14 2208 11/04/14 1045 11/04/14 1415 11/07/14 1304  PHART 7.239*  --   --  7.277* 7.446 7.378  PCO2ART 46.6*  --   --  47.6* 27.6* 32.2*  PO2ART 202.0*  --   --  144.0* 157.0* 79.0*   HCO3 19.4*  --   --  21.5 18.7* 19.0*  TCO2 20.9 18 18  23.0 19.5 20  O2SAT 98.6  --   --  98.3 98.9 96.0   CBC  Recent Labs Lab 11/07/14 0420 11/07/14 1050 11/08/14 0500  HGB 12.0 12.1 12.0  HCT 35.8* 36.3 36.7  WBC 9.2 10.9* 11.3*  PLT 84* 87* PENDING    COAGULATION  Recent Labs Lab 11/02/14 1349 11/03/14 1820 11/04/14 0355  INR 1.08 1.17 1.15    CARDIAC    Recent Labs Lab 11/03/14 1820 11/04/14 11/04/14 0740  TROPONINI 0.08* 0.25* 0.19*   No results for input(s): PROBNP in the last 168 hours.   CHEMISTRY  Recent Labs Lab 11/04/14 0740  11/05/14 0400  11/05/14 1538 11/06/14 0400 11/06/14 2148 11/07/14 0420 11/08/14 0500  NA 142  < > 141  < > 142 140 143 141 144  K 4.0  < > 3.4*  < > 3.4* 3.0* 3.5 3.8 3.4*  CL 112  < > 115*  < > 116* 116* 117* 115* 111  CO2 25  < > 19  < > 22 16* 21 19 23   GLUCOSE 150*  < > 104*  < > 119* 111* 124* 136* 122*  BUN 19  < > 15  < > 20 19 18 21 20   CREATININE 1.13*  < > 1.04  < > 1.21* 1.10 1.12* 1.13* 1.10  CALCIUM 8.2*  < > 8.3*  < > 8.6 8.4 8.5 8.7 8.8  MG 2.2  --  1.7  --   --  1.8 2.1 2.1 1.8  PHOS 4.7*  --  2.5  --   --  3.5  --  3.9 3.5  < > = values in this interval not displayed. Estimated Creatinine Clearance: 59.3 mL/min (by C-G formula based on Cr of 1.1).  LIVER  Recent Labs Lab 11/02/14 0831 11/02/14 1349 11/03/14 0441 11/03/14 1820 11/04/14 0355  AST 17  --  18  --   --   ALT 19  --  21  --   --   ALKPHOS 89  --  90  --   --   BILITOT 0.5  --  0.9  --   --   PROT 7.4  --  6.7  --   --   ALBUMIN 3.5  --  3.6  --   --   INR  --  1.08  --  1.17 1.15   INFECTIOUS  Recent Labs Lab 11/04/14 0355  LATICACIDVEN 1.4   ENDOCRINE CBG (last 3)   Recent Labs  11/07/14 1114 11/07/14 1619 11/07/14 1947  GLUCAP 149* 120* 105*   IMAGING x48h Dg Chest Port 1 View  11/08/2014   CLINICAL DATA:  Acute onset of respiratory failure. Subsequent encounter.  EXAM: PORTABLE CHEST - 1 VIEW   COMPARISON:  Chest radiograph performed 11/07/2014  FINDINGS: [name] right IJ line is noted ending overlying the mid SVC.  A small right pleural effusion is noted. Bibasilar airspace opacification may reflect pneumonia or possibly mildly asymmetric interstitial edema, right greater than left. No pneumothorax is seen. The left costophrenic angle is incompletely imaged on this study.  The cardiomediastinal silhouette is mildly enlarged. No acute osseous abnormalities are seen.  IMPRESSION: 1. Small right pleural effusion noted. Bibasilar airspace opacification may reflect pneumonia or possibly mildly asymmetric interstitial edema, right greater than left. This is mildly improved on the left and mildly worsened on the right since the prior study. 2. Mild cardiomegaly noted.   Electronically Signed   By: Garald Balding M.D.   On: 11/08/2014 07:50   Dg Chest Port 1 View  11/07/2014   CLINICAL DATA:  Endotracheal tube placement  EXAM: PORTABLE CHEST - 1 VIEW  COMPARISON:  Radiograph 11/06/2014  FINDINGS: Endotracheal tube 6 cm from carina in good position. NG tube extends to the stomach. Right central venous line tip in the distal SVC. Stable enlarged cardiac silhouette. The bilateral pleural effusions and bibasilar edema. No pneumothorax.  IMPRESSION: 1. Endotracheal tube in good position. 2. No change in bilateral pleural effusions and mild basilar edema.   Electronically Signed   By: Suzy Bouchard M.D.   On: 11/07/2014 08:53   Dg Chest Port 1 View  11/06/2014   CLINICAL DATA:  Pulled endotracheal tube  EXAM: PORTABLE CHEST - 1 VIEW  COMPARISON:  11/05/2011  FINDINGS: Endotracheal tube terminates above the thoracic inlet, 12.5 cm above the carina.  Patchy bilateral lower lobe opacities, possibly atelectasis with small pleural effusions. Mild perihilar edema. No pneumothorax.  Cardiomegaly.  Right IJ venous catheter terminates in the lower SVC.  IMPRESSION: Endotracheal tube terminates above the thoracic  inlet, 12.5 cm above the carina.  Otherwise unchanged.   Electronically Signed   By: Julian Hy M.D.   On: 11/06/2014 13:30       ASSESSMENT / PLAN:  PULMONARY OETT 3/21 >>>3/25 A: Acute respiratory failure s/p cardiac arrest New transmitted upper airway sounds noted.  P:   Titrate O2 for sats of 88-92%. IS and flutter valve per RT protocol VAP bundle Monitor for airway protection in the ICU given upper airway wheezing.  CARDIOVASCULAR CVL 3/21 >>> A:  Cardiac arrest,   - Cardiomyopathy LVEF 35-45% severe HK inferior, diffuse HK  - New Onset AF RVR  - Cath with severe CAD 11/04/14 New and severe HTN.  P:  Cards following PRN hydralazine to keep SBP < 152mm/Hg Restart norvasc. Add clonidine patch. Add IV labetalol now then change to PO able to take PO.  RENAL A:   AKI -normal creat 11/05/2014,    K low and mag low -repleted P:   Hold further lasix at this point Strict I&O Follow Bmet in AM. Replace electrolytes as indicated  GASTROINTESTINAL A:   New concern for inability to swallow  P:   NPO, swallow evaluation ordered. PPI  HEMATOLOGIC A:   No acute issues  P:  Follow cbc Heparin gtt for AF per pharmacy  INFECTIOUS  A:   ? CAP - ABX stopped by primary  P:   Monitor clinically  ENDOCRINE A:   No acute issues  P:   Follow glucose on chemistry Add SSI if consistently greater than 180.  NEUROLOGIC A:   Acute metabolic encephalopathy 7/06 -follows commands per RN, on precedex. No myoclonus. EEG abnormal but done under sedation  P:   D/C precedex gtt + fent prn Monitor  FAMILY  - Updates: No  family bedside, spoke with cardiology, all issues at this point are cardiac, for CABG next week.  Will transfer care to cardiology.  PCCM will sign off, please call back if needed.  - Inter-disciplinary family meet or Palliative Care meeting due by:  3/28.  I reviewed the CXR personally.  Evidence of pulmonary edema noted.  Case was  discussed with cardiology Dr. Debara Pickett as above.  Rush Farmer, M.D. [facility] Pulmonary/Critical Care Medicine. Pager: [phone]. After hours pager: [phone].  11/08/2014 8:05 AM

## 2014-11-09 LAB — CBC
HCT: 35 % — ABNORMAL LOW (ref 36.0–46.0)
Hemoglobin: 11.7 g/dL — ABNORMAL LOW (ref 12.0–15.0)
MCH: 29.3 pg (ref 26.0–34.0)
MCHC: 33.4 g/dL (ref 30.0–36.0)
MCV: 87.7 fL (ref 78.0–100.0)
Platelets: 101 10*3/uL — ABNORMAL LOW (ref 150–400)
RBC: 3.99 MIL/uL (ref 3.87–5.11)
RDW: 16.3 % — ABNORMAL HIGH (ref 11.5–15.5)
WBC: 9.6 10*3/uL (ref 4.0–10.5)

## 2014-11-09 LAB — GLUCOSE, CAPILLARY
Glucose-Capillary: 102 mg/dL — ABNORMAL HIGH (ref 70–99)
Glucose-Capillary: 106 mg/dL — ABNORMAL HIGH (ref 70–99)
Glucose-Capillary: 107 mg/dL — ABNORMAL HIGH (ref 70–99)
Glucose-Capillary: 111 mg/dL — ABNORMAL HIGH (ref 70–99)
Glucose-Capillary: 113 mg/dL — ABNORMAL HIGH (ref 70–99)
Glucose-Capillary: 98 mg/dL (ref 70–99)
Glucose-Capillary: 99 mg/dL (ref 70–99)

## 2014-11-09 LAB — BASIC METABOLIC PANEL
Anion gap: 7 (ref 5–15)
BUN: 13 mg/dL (ref 6–23)
CO2: 23 mmol/L (ref 19–32)
Calcium: 8.5 mg/dL (ref 8.4–10.5)
Chloride: 111 mmol/L (ref 96–112)
Creatinine, Ser: 0.95 mg/dL (ref 0.50–1.10)
GFR calc Af Amer: 69 mL/min — ABNORMAL LOW (ref >90)
GFR calc non Af Amer: 60 mL/min — ABNORMAL LOW (ref >90)
Glucose, Bld: 107 mg/dL — ABNORMAL HIGH (ref 70–99)
Potassium: 2.9 mmol/L — ABNORMAL LOW (ref 3.5–5.1)
Sodium: 141 mmol/L (ref 135–145)

## 2014-11-09 LAB — HEPARIN LEVEL (UNFRACTIONATED): Heparin Unfractionated: 0.39 IU/mL (ref 0.30–0.70)

## 2014-11-09 LAB — MAGNESIUM: Magnesium: 2 mg/dL (ref 1.5–2.5)

## 2014-11-09 LAB — PHOSPHORUS: Phosphorus: 3 mg/dL (ref 2.3–4.6)

## 2014-11-09 MED ORDER — TRAMADOL HCL 50 MG PO TABS
50.0000 mg | ORAL_TABLET | Freq: Four times a day (QID) | ORAL | Status: DC | PRN
  Administered 2014-11-09 – 2014-11-11 (×3): 50 mg via ORAL
  Filled 2014-11-09 (×4): qty 1

## 2014-11-09 MED ORDER — POTASSIUM CHLORIDE CRYS ER 20 MEQ PO TBCR
40.0000 meq | EXTENDED_RELEASE_TABLET | ORAL | Status: AC
  Administered 2014-11-09 (×2): 40 meq via ORAL
  Filled 2014-11-09 (×2): qty 2

## 2014-11-09 MED ORDER — INSULIN ASPART 100 UNIT/ML ~~LOC~~ SOLN: 0.0000 [IU] | Freq: Three times a day (TID) | SUBCUTANEOUS | Status: DC

## 2014-11-09 MED ORDER — SALINE SPRAY 0.65 % NA SOLN
1.0000 | NASAL | Status: DC | PRN
  Filled 2014-11-09: qty 44

## 2014-11-09 MED ORDER — LABETALOL HCL 5 MG/ML IV SOLN
20.0000 mg | INTRAVENOUS | Status: DC | PRN
  Administered 2014-11-09 – 2014-11-11 (×6): 20 mg via INTRAVENOUS
  Filled 2014-11-09 (×6): qty 4

## 2014-11-09 MED ORDER — METOPROLOL TARTRATE 12.5 MG HALF TABLET
12.5000 mg | ORAL_TABLET | Freq: Two times a day (BID) | ORAL | Status: DC
  Administered 2014-11-09 – 2014-11-11 (×5): 12.5 mg via ORAL
  Filled 2014-11-09 (×6): qty 1

## 2014-11-09 MED ORDER — NITROGLYCERIN IN D5W 200-5 MCG/ML-% IV SOLN
2.0000 ug/min | INTRAVENOUS | Status: DC
  Administered 2014-11-09: 5 ug/min via INTRAVENOUS
  Filled 2014-11-09: qty 250

## 2014-11-09 MED ORDER — INSULIN ASPART 100 UNIT/ML ~~LOC~~ SOLN: 0.0000 [IU] | Freq: Every day | SUBCUTANEOUS | Status: DC

## 2014-11-09 MED ORDER — CETYLPYRIDINIUM CHLORIDE 0.05 % MT LIQD
7.0000 mL | Freq: Two times a day (BID) | OROMUCOSAL | Status: DC
  Administered 2014-11-09 – 2014-11-13 (×8): 7 mL via OROMUCOSAL

## 2014-11-09 NOTE — Progress Notes (Signed)
ANTICOAGULATION CONSULT NOTE - Follow Up Consult  Pharmacy Consult for heparin Indication: atrial fibrillation  Allergies  Allergen Reactions  . Bee Venom Anaphylaxis  . Shrimp [Shellfish Allergy] Swelling    Patient Measurements: Height: 5' 7.5" (171.5 cm) Weight: 224 lb 13.9 oz (102 kg) IBW/kg (Calculated) : 62.75 Heparin Dosing Weight: 85 kg  Vital Signs: Temp: 97.9 F (36.6 C) (03/27 0730) Temp Source: Oral (03/27 0730) BP: 169/64 mmHg (03/27 0930) Pulse Rate: 76 (03/27 0930)  Labs:  Recent Labs  11/07/14 0420 11/07/14 1050 11/08/14 0500 11/08/14 1200 11/08/14 2020 11/09/14 0501 11/09/14 0502  HGB 12.0 12.1 12.0  --   --  11.7*  --   HCT 35.8* 36.3 36.7  --   --  35.0*  --   PLT 84* 87* 92*  --   --  101*  --   HEPARINUNFRC 0.30  --  0.20* 0.29* 0.40  --  0.39  CREATININE 1.13*  --  1.10  --   --  0.95  --     Estimated Creatinine Clearance: 69.3 mL/min (by C-G formula based on Cr of 0.95).   Medications:  Scheduled:  . antiseptic oral rinse  7 mL Mouth Rinse BID  . aspirin EC  81 mg Oral Daily  . atorvastatin  80 mg Oral q1800  . cloNIDine  0.2 mg Transdermal Weekly  . docusate  100 mg Oral Daily  . insulin aspart  0-15 Units Subcutaneous 6 times per day  . lisinopril  20 mg Oral Daily  . pantoprazole (PROTONIX) IV  40 mg Intravenous Q24H  . potassium chloride  40 mEq Oral Q1 Hr x 2    Assessment: 69 yo f admitted on 3/16 from PCP office for new onset afib. Pharmacy is consulted to dose heparin.  Heparin level has been therapeutic x 2 (0.40, 0.39) on 1600 units/hr.  Hgb stable, plts trending back up, no bleeding per RN.  Goal of Therapy:  Heparin level 0.3-0.7 units/ml Monitor platelets by anticoagulation protocol: Yes   Plan:  Continue heparin infusion at 1600 units/hr Daily HL Monitor hgb/plts, s/s of bleeding, clinical course, plans for CABG  Jamiah Homeyer L. Nicole Kindred, PharmD Clinical Pharmacy Resident Pager: [phone] 11/09/2014 10:51  AM

## 2014-11-09 NOTE — Progress Notes (Addendum)
Patient has converted back into NSR with rate in the 60's with occasional PACs and  occasional pacer spikes.  Cardizem drip titrated down at this time. Dr. Posey Pronto made aware.

## 2014-11-09 NOTE — Progress Notes (Signed)
Transvenous pacer and sheath removed per order. No complications and VSS.

## 2014-11-09 NOTE — Progress Notes (Signed)
DAILY PROGRESS NOTE  Subjective:  Stable overnight. Mildly anxious. Converted back to sinus rhythm. No evidence for pacing overnight. Still on low dose cardizem. C/o of chest wall pain, suspect secondary to CPR.   Objective:   Temp:  [97.7 F (36.5 C)-98.5 F (36.9 C)] 97.9 F (36.6 C) (03/27 0730) Pulse Rate:  [46-147] 76 (03/27 0930) BP: (118-202)/(47-112) 169/64 mmHg (03/27 0930) SpO2:  [98 %-100 %] 100 % (03/27 0930) Weight:  [224 lb 13.9 oz (102 kg)] 224 lb 13.9 oz (102 kg) (03/27 0447) Weight change: 3 lb 8.4 oz (1.6 kg)  Intake/Output from previous day: 03/26 0701 - 03/27 0700 In: 1496.7 [P.O.:180; I.V.:1066.7; IV Piggyback:250] Out: 1160 [Urine:1160]  Intake/Output from this shift: Total I/O In: 93 [I.V.:93] Out: 200 [Urine:200]  Medications: Current Facility-Administered Medications  Medication Dose Route Frequency Provider Last Rate Last Dose  . 0.9 %  sodium chloride infusion   Intravenous Continuous PRN [name], MD 10 mL/hr at 11/09/14 0800    . acetaminophen (TYLENOL) tablet 650 mg  650 mg Oral Q4H PRN Troy Sine, MD   650 mg at 11/09/14 0801  . antiseptic oral rinse (CPC / CETYLPYRIDINIUM CHLORIDE 0.05%) solution 7 mL  7 mL Mouth Rinse BID [name], MD   0 mL at 11/09/14 0030  . aspirin EC tablet 81 mg  81 mg Oral Daily Troy Sine, MD   81 mg at 11/08/14 0944  . atorvastatin (LIPITOR) tablet 80 mg  80 mg Oral q1800 Troy Sine, MD   80 mg at 11/08/14 1808  . cloNIDine (CATAPRES - Dosed in mg/24 hr) patch 0.2 mg  0.2 mg Transdermal Weekly Wilhelmina Mcardle, MD   0.2 mg at 11/07/14 1846  . diltiazem (CARDIZEM) 100 mg in dextrose 5 % 100 mL (1 mg/mL) infusion  5-15 mg/hr Intravenous Titrated Wilhelmina Mcardle, MD 5 mL/hr at 11/09/14 0800 5 mg/hr at 11/09/14 0800  . docusate (COLACE) 50 MG/5ML liquid 100 mg  100 mg Oral Daily Brand Males, MD   100 mg at 11/08/14 0944  . heparin ADULT infusion 100 units/mL (25000 units/250 mL)  1,600  Units/hr Intravenous Continuous Cassie Jodean Lima, RPH 16 mL/hr at 11/08/14 1954 1,600 Units/hr at 11/08/14 1954  . hydrALAZINE (APRESOLINE) injection 10-40 mg  10-40 mg Intravenous Q4H PRN Corey Harold, NP   20 mg at 11/09/14 0801  . insulin aspart (novoLOG) injection 0-15 Units  0-15 Units Subcutaneous 6 times per day Juanito Doom, MD   2 Units at 11/08/14 1534  . labetalol (NORMODYNE,TRANDATE) injection 20 mg  20 mg Intravenous Q1H PRN Wilhelmina Mcardle, MD   20 mg at 11/08/14 0107  . lisinopril (PRINIVIL,ZESTRIL) tablet 20 mg  20 mg Oral Daily [name], MD   20 mg at 11/08/14 0944  . ondansetron (ZOFRAN) injection 4 mg  4 mg Intravenous Q6H PRN Troy Sine, MD      . pantoprazole (PROTONIX) injection 40 mg  40 mg Intravenous Q24H Corey Harold, NP   40 mg at 11/08/14 1808  . potassium chloride SA (K-DUR,KLOR-CON) CR tablet 40 mEq  40 mEq Oral Q1 Hr x 2 [name], MD      . RESOURCE THICKENUP CLEAR   Oral PRN [name], MD      . sodium chloride (OCEAN) 0.65 % nasal spray 1 spray  1 spray Each Nare PRN [name], MD      .  white petrolatum (VASELINE) gel   Topical PRN Rush Farmer, MD   0.2 application at 40/98/11 1700    Physical Exam: General appearance: alert and no distress Neck: no carotid bruit and no JVD Lungs: clear to auscultation bilaterally Heart: regular rate and rhythm and no murmur Abdomen: soft, non-tender; bowel sounds normal; no masses,  no organomegaly Extremities: extremities normal, atraumatic, no cyanosis or edema Pulses: 2+ and symmetric Skin: Skin color, texture, turgor normal. No rashes or lesions Neurologic: Mental status: Alert and oriented, somewhat slow reactions, but following commands Psych: Pleasant  Lab Results: Results for orders placed or performed during the hospital encounter of 10/29/14 (from the past 48 hour(s))  CBC     Status: Abnormal   Collection Time: 11/07/14 10:50 AM  Result Value Ref Range   WBC 10.9 (H)  4.0 - 10.5 K/uL    Comment: REPEATED TO VERIFY   RBC 4.12 3.87 - 5.11 MIL/uL   Hemoglobin 12.1 12.0 - 15.0 g/dL    Comment: CONSISTENT WITH PREVIOUS RESULT   HCT 36.3 36.0 - 46.0 %   MCV 88.1 78.0 - 100.0 fL   MCH 29.4 26.0 - 34.0 pg   MCHC 33.3 30.0 - 36.0 g/dL   RDW 16.4 (H) 11.5 - 15.5 %   Platelets 87 (L) 150 - 400 K/uL    Comment: SPECIMEN CHECKED FOR CLOTS REPEATED TO VERIFY CONSISTENT WITH PREVIOUS RESULT   Glucose, capillary     Status: Abnormal   Collection Time: 11/07/14 11:14 AM  Result Value Ref Range   Glucose-Capillary 149 (H) 70 - 99 mg/dL   Comment 1 Capillary Specimen   I-STAT 3, arterial blood gas (G3+)     Status: Abnormal   Collection Time: 11/07/14  1:04 PM  Result Value Ref Range   pH, Arterial 7.378 7.350 - 7.450   pCO2 arterial 32.2 (L) 35.0 - 45.0 mmHg   pO2, Arterial 79.0 (L) 80.0 - 100.0 mmHg   Bicarbonate 19.0 (L) 20.0 - 24.0 mEq/L   TCO2 20 0 - 100 mmol/L   O2 Saturation 96.0 %   Acid-base deficit 5.0 (H) 0.0 - 2.0 mmol/L   Patient temperature 98.6 F    Collection site ARTERIAL LINE    Drawn by Operator    Sample type ARTERIAL   Glucose, capillary     Status: Abnormal   Collection Time: 11/07/14  4:19 PM  Result Value Ref Range   Glucose-Capillary 120 (H) 70 - 99 mg/dL   Comment 1 Capillary Specimen   Glucose, capillary     Status: Abnormal   Collection Time: 11/07/14  7:47 PM  Result Value Ref Range   Glucose-Capillary 105 (H) 70 - 99 mg/dL  Glucose, capillary     Status: Abnormal   Collection Time: 11/08/14 12:12 AM  Result Value Ref Range   Glucose-Capillary 114 (H) 70 - 99 mg/dL  Magnesium     Status: None   Collection Time: 11/08/14  5:00 AM  Result Value Ref Range   Magnesium 1.8 1.5 - 2.5 mg/dL  Phosphorus     Status: None   Collection Time: 11/08/14  5:00 AM  Result Value Ref Range   Phosphorus 3.5 2.3 - 4.6 mg/dL  Basic metabolic panel     Status: Abnormal   Collection Time: 11/08/14  5:00 AM  Result Value Ref Range    Sodium 144 135 - 145 mmol/L   Potassium 3.4 (L) 3.5 - 5.1 mmol/L   Chloride 111 96 - 112 mmol/L  CO2 23 19 - 32 mmol/L   Glucose, Bld 122 (H) 70 - 99 mg/dL   BUN 20 6 - 23 mg/dL   Creatinine, Ser 1.10 0.50 - 1.10 mg/dL   Calcium 8.8 8.4 - 10.5 mg/dL   GFR calc non Af Amer 50 (L) >90 mL/min   GFR calc Af Amer 58 (L) >90 mL/min    Comment: (NOTE) The eGFR has been calculated using the CKD EPI equation. This calculation has not been validated in all clinical situations. eGFR's persistently <90 mL/min signify possible Chronic Kidney Disease.    Anion gap 10 5 - 15  CBC with Differential/Platelet     Status: Abnormal   Collection Time: 11/08/14  5:00 AM  Result Value Ref Range   WBC 11.3 (H) 4.0 - 10.5 K/uL   RBC 4.16 3.87 - 5.11 MIL/uL   Hemoglobin 12.0 12.0 - 15.0 g/dL   HCT 36.7 36.0 - 46.0 %   MCV 88.2 78.0 - 100.0 fL   MCH 28.8 26.0 - 34.0 pg   MCHC 32.7 30.0 - 36.0 g/dL   RDW 16.4 (H) 11.5 - 15.5 %   Platelets 92 (L) 150 - 400 K/uL    Comment: SPECIMEN CHECKED FOR CLOTS REPEATED TO VERIFY PLATELET COUNT CONFIRMED BY SMEAR    Neutrophils Relative % 75 43 - 77 %   Neutro Abs 8.5 (H) 1.7 - 7.7 K/uL   Lymphocytes Relative 15 12 - 46 %   Lymphs Abs 1.7 0.7 - 4.0 K/uL   Monocytes Relative 10 3 - 12 %   Monocytes Absolute 1.1 (H) 0.1 - 1.0 K/uL   Eosinophils Relative 0 0 - 5 %   Eosinophils Absolute 0.0 0.0 - 0.7 K/uL   Basophils Relative 0 0 - 1 %   Basophils Absolute 0.0 0.0 - 0.1 K/uL  Heparin level (unfractionated)     Status: Abnormal   Collection Time: 11/08/14  5:00 AM  Result Value Ref Range   Heparin Unfractionated 0.20 (L) 0.30 - 0.70 IU/mL    Comment:        IF HEPARIN RESULTS ARE BELOW EXPECTED VALUES, AND PATIENT DOSAGE HAS BEEN CONFIRMED, SUGGEST FOLLOW UP TESTING OF ANTITHROMBIN III LEVELS.   Glucose, capillary     Status: Abnormal   Collection Time: 11/08/14  5:02 AM  Result Value Ref Range   Glucose-Capillary 116 (H) 70 - 99 mg/dL  Glucose,  capillary     Status: Abnormal   Collection Time: 11/08/14  8:49 AM  Result Value Ref Range   Glucose-Capillary 113 (H) 70 - 99 mg/dL  Glucose, capillary     Status: None   Collection Time: 11/08/14 11:42 AM  Result Value Ref Range   Glucose-Capillary 97 70 - 99 mg/dL   Comment 1 Capillary Specimen   Heparin level (unfractionated)     Status: Abnormal   Collection Time: 11/08/14 12:00 PM  Result Value Ref Range   Heparin Unfractionated 0.29 (L) 0.30 - 0.70 IU/mL    Comment:        IF HEPARIN RESULTS ARE BELOW EXPECTED VALUES, AND PATIENT DOSAGE HAS BEEN CONFIRMED, SUGGEST FOLLOW UP TESTING OF ANTITHROMBIN III LEVELS.   Glucose, capillary     Status: Abnormal   Collection Time: 11/08/14  3:32 PM  Result Value Ref Range   Glucose-Capillary 133 (H) 70 - 99 mg/dL   Comment 1 Capillary Specimen   Glucose, capillary     Status: Abnormal   Collection Time: 11/08/14  7:42 PM  Result  Value Ref Range   Glucose-Capillary 107 (H) 70 - 99 mg/dL   Comment 1 Capillary Specimen   Heparin level (unfractionated)     Status: None   Collection Time: 11/08/14  8:20 PM  Result Value Ref Range   Heparin Unfractionated 0.40 0.30 - 0.70 IU/mL    Comment:        IF HEPARIN RESULTS ARE BELOW EXPECTED VALUES, AND PATIENT DOSAGE HAS BEEN CONFIRMED, SUGGEST FOLLOW UP TESTING OF ANTITHROMBIN III LEVELS.   Glucose, capillary     Status: Abnormal   Collection Time: 11/08/14 11:46 PM  Result Value Ref Range   Glucose-Capillary 113 (H) 70 - 99 mg/dL  Glucose, capillary     Status: Abnormal   Collection Time: 11/09/14  4:46 AM  Result Value Ref Range   Glucose-Capillary 111 (H) 70 - 99 mg/dL  CBC     Status: Abnormal   Collection Time: 11/09/14  5:01 AM  Result Value Ref Range   WBC 9.6 4.0 - 10.5 K/uL   RBC 3.99 3.87 - 5.11 MIL/uL   Hemoglobin 11.7 (L) 12.0 - 15.0 g/dL    Comment: SPECIMEN CHECKED FOR CLOTS REPEATED TO VERIFY    HCT 35.0 (L) 36.0 - 46.0 %   MCV 87.7 78.0 - 100.0 fL   MCH  29.3 26.0 - 34.0 pg   MCHC 33.4 30.0 - 36.0 g/dL   RDW 16.3 (H) 11.5 - 15.5 %   Platelets 101 (L) 150 - 400 K/uL    Comment: SPECIMEN CHECKED FOR CLOTS REPEATED TO VERIFY CONSISTENT WITH PREVIOUS RESULT   Basic metabolic panel     Status: Abnormal   Collection Time: 11/09/14  5:01 AM  Result Value Ref Range   Sodium 141 135 - 145 mmol/L   Potassium 2.9 (L) 3.5 - 5.1 mmol/L   Chloride 111 96 - 112 mmol/L   CO2 23 19 - 32 mmol/L   Glucose, Bld 107 (H) 70 - 99 mg/dL   BUN 13 6 - 23 mg/dL   Creatinine, Ser 0.95 0.50 - 1.10 mg/dL   Calcium 8.5 8.4 - 10.5 mg/dL   GFR calc non Af Amer 60 (L) >90 mL/min   GFR calc Af Amer 69 (L) >90 mL/min    Comment: (NOTE) The eGFR has been calculated using the CKD EPI equation. This calculation has not been validated in all clinical situations. eGFR's persistently <90 mL/min signify possible Chronic Kidney Disease.    Anion gap 7 5 - 15  Magnesium     Status: None   Collection Time: 11/09/14  5:01 AM  Result Value Ref Range   Magnesium 2.0 1.5 - 2.5 mg/dL  Phosphorus     Status: None   Collection Time: 11/09/14  5:01 AM  Result Value Ref Range   Phosphorus 3.0 2.3 - 4.6 mg/dL  Heparin level (unfractionated)     Status: None   Collection Time: 11/09/14  5:02 AM  Result Value Ref Range   Heparin Unfractionated 0.39 0.30 - 0.70 IU/mL    Comment:        IF HEPARIN RESULTS ARE BELOW EXPECTED VALUES, AND PATIENT DOSAGE HAS BEEN CONFIRMED, SUGGEST FOLLOW UP TESTING OF ANTITHROMBIN III LEVELS.   Glucose, capillary     Status: None   Collection Time: 11/09/14  7:26 AM  Result Value Ref Range   Glucose-Capillary 99 70 - 99 mg/dL    Imaging: Dg Chest Port 1 View  11/08/2014   CLINICAL DATA:  Acute onset of respiratory failure.  Subsequent encounter.  EXAM: PORTABLE CHEST - 1 VIEW  COMPARISON:  Chest radiograph performed 11/07/2014  FINDINGS: [name] right IJ line is noted ending overlying the mid SVC.  A small right pleural effusion is  noted. Bibasilar airspace opacification may reflect pneumonia or possibly mildly asymmetric interstitial edema, right greater than left. No pneumothorax is seen. The left costophrenic angle is incompletely imaged on this study.  The cardiomediastinal silhouette is mildly enlarged. No acute osseous abnormalities are seen.  IMPRESSION: 1. Small right pleural effusion noted. Bibasilar airspace opacification may reflect pneumonia or possibly mildly asymmetric interstitial edema, right greater than left. This is mildly improved on the left and mildly worsened on the right since the prior study. 2. Mild cardiomegaly noted.   Electronically Signed   By: Garald Balding M.D.   On: 11/08/2014 07:50    Assessment:  Principal Problem:   Cardiac arrest Active Problems:   HTN (hypertension)   Atrial fibrillation   CAP (community acquired pneumonia)   Cardiomyopathy   Hypokalemia   Acute on chronic combined systolic and diastolic HF (heart failure)   NSTEMI (non-ST elevated myocardial infarction)   SOB (shortness of breath)   Atrial fibrillation with RVR   PEA (Pulseless electrical activity)   Acute respiratory failure with hypoxemia   Anoxic brain injury   Essential hypertension   Cardiomyopathy, ischemic   Coronary artery disease due to lipid rich plaque   Accelerated hypertension   Plan:  1. Cardiac arrest - Ms. Kubicki had a brady-asystolic arrest. She underwent cardiac catheterization which revealed 80% LM stenosis and multivessel CAD. CT surgery was consulted felt she should have CABG, pending neurologic recovery - she is on aspirin and heparin. Now awake. Will need to re-involve CT surgery 2. A-fib with RVR - intermittent - just recurred last night. Converted to NSR today. On heparin. Wean off diltiazem gtts. Discontinue pacer wire today. Will add low dose metoprolol tartrate. 3. Cardiomyopathy - clear ischemic etiology. LVEF 35-40% - CABG is indicated based on cath findings. Has been diuresed  the past 2 days.  4. Hypertension - BP now elevated - start afterload reductions. 5. Possible anoxic brain injury - extubated and awake - now following commands and seems oriented. 6. Hypokalemia- 2.9 today, replete again. 7. Bradycardia - d/c temporary pacemaker today.. 8. DM2 - eating - move BG's to AC/HS.  Time Spent Directly with Patient:  30 minutes  Length of Stay:  LOS: 10 days   [name], MD, [facility] Attending Cardiologist [facility]  Sladen Plancarte C 11/09/2014, 10:31 AM

## 2014-11-10 LAB — BASIC METABOLIC PANEL
Anion gap: 6 (ref 5–15)
BUN: 11 mg/dL (ref 6–23)
CO2: 22 mmol/L (ref 19–32)
Calcium: 9 mg/dL (ref 8.4–10.5)
Chloride: 115 mmol/L — ABNORMAL HIGH (ref 96–112)
Creatinine, Ser: 0.97 mg/dL (ref 0.50–1.10)
GFR calc Af Amer: 68 mL/min — ABNORMAL LOW (ref >90)
GFR calc non Af Amer: 58 mL/min — ABNORMAL LOW (ref >90)
Glucose, Bld: 107 mg/dL — ABNORMAL HIGH (ref 70–99)
Potassium: 3.4 mmol/L — ABNORMAL LOW (ref 3.5–5.1)
Sodium: 143 mmol/L (ref 135–145)

## 2014-11-10 LAB — HEPARIN LEVEL (UNFRACTIONATED): Heparin Unfractionated: 0.42 IU/mL (ref 0.30–0.70)

## 2014-11-10 LAB — GLUCOSE, CAPILLARY
Glucose-Capillary: 99 mg/dL (ref 70–99)
Glucose-Capillary: 98 mg/dL (ref 70–99)
Glucose-Capillary: 118 mg/dL — ABNORMAL HIGH (ref 70–99)

## 2014-11-10 MED ORDER — LISINOPRIL 10 MG PO TABS
30.0000 mg | ORAL_TABLET | Freq: Every day | ORAL | Status: DC
  Administered 2014-11-10 – 2014-11-11 (×2): 30 mg via ORAL
  Filled 2014-11-10 (×2): qty 1

## 2014-11-10 MED ORDER — ENSURE ENLIVE PO LIQD
237.0000 mL | Freq: Two times a day (BID) | ORAL | Status: DC
Start: 2014-11-10 — End: 2014-11-14
  Administered 2014-11-10 – 2014-11-13 (×5): 237 mL via ORAL

## 2014-11-10 MED ORDER — HYDROCHLOROTHIAZIDE 12.5 MG PO CAPS
12.5000 mg | ORAL_CAPSULE | Freq: Every day | ORAL | Status: DC
  Administered 2014-11-10 – 2014-11-13 (×4): 12.5 mg via ORAL
  Filled 2014-11-10 (×5): qty 1

## 2014-11-10 MED ORDER — POTASSIUM CHLORIDE CRYS ER 20 MEQ PO TBCR
30.0000 meq | EXTENDED_RELEASE_TABLET | ORAL | Status: AC
  Administered 2014-11-10 (×2): 30 meq via ORAL
  Filled 2014-11-10 (×4): qty 1

## 2014-11-10 NOTE — Progress Notes (Signed)
NUTRITION FOLLOW UP  DOCUMENTATION CODES Per approved criteria  -Obesity Unspecified   INTERVENTION: Ensure Enlive po BID, each supplement provides 350 kcal and 20 grams of protein  NUTRITION DIAGNOSIS: Inadequate oral intake now related to dysphagia and taste changes as evidenced by meal completion < 50%; ongoing.  Goal: Enteral nutrition to provide 65-70% of estimated calorie needs based on ASPEN guidelines for hypocaloric, high protein feeding in critically ill obese individuals, NA  New Goal:  Pt to meet >/= 90% of their estimated nutrition needs    Monitor:  Diet advancement, PO intake, supplement acceptance, weight trends, labs  ASSESSMENT: Pt admitted with new onset AFib with RVR. Pt scheduled for LHC on 3/18 but refused, rescheduled for 3/21 pt had SOB and progressed to cardiac arrest and intubated.  Pt extubated 3/25 and started on dysphagia diet 3/26 per SLP.   Pt c/o disliking both textures and taste of foods. Pt had only had a few bites of magic cup, which she did not like, this am for Breakfast.  Explained diet, menu, and importance of nutrition prior to surgery to pt and family. Pt is willing to have ensure.  Pt being evaluated for possible CABG.   Labs reviewed: Potassium low on replacement.   Height: Ht Readings from Last 1 Encounters:  10/29/14 5' 7.5" (1.715 m)    Weight: Wt Readings from Last 1 Encounters:  11/10/14 220 lb 10.9 oz (100.1 kg)  Admission weight: 215 lb (97.7 kg) 3/16 Admission BMI: 33.2 (obesity class I)  BMI:  Body mass index is 34.03 kg/(m^2).  Estimated Nutritional Needs: Kcal: 1800-2000 Protein: 90-115 grams Fluid: > 1.8 L/day  Skin: blister and skin tear under hypothermia pad  Diet Order: DIET DYS 2 Room service appropriate?: Yes; Fluid consistency:: Nectar Thick Meal Completion: 30%    Intake/Output Summary (Last 24 hours) at 11/10/14 0942 Last data filed at 11/10/14 0600  Gross per 24 hour  Intake 926.96 ml  Output     800 ml  Net 126.96 ml    Last BM: 3/27   Labs:   Recent Labs Lab 11/07/14 0420 11/08/14 0500 11/09/14 0501 11/10/14 0822  NA 141 144 141 143  K 3.8 3.4* 2.9* 3.4*  CL 115* 111 111 115*  CO2 19 23 23 22   BUN 21 20 13 11   CREATININE 1.13* 1.10 0.95 0.97  CALCIUM 8.7 8.8 8.5 9.0  MG 2.1 1.8 2.0  --   PHOS 3.9 3.5 3.0  --   GLUCOSE 136* 122* 107* 107*    CBG (last 3)   Recent Labs  11/09/14 1240 11/09/14 1630 11/09/14 2202  GLUCAP 98 106* 102*    Scheduled Meds: . antiseptic oral rinse  7 mL Mouth Rinse BID  . aspirin EC  81 mg Oral Daily  . atorvastatin  80 mg Oral q1800  . cloNIDine  0.2 mg Transdermal Weekly  . docusate  100 mg Oral Daily  . hydrochlorothiazide  12.5 mg Oral Daily  . insulin aspart  0-15 Units Subcutaneous TID WC  . insulin aspart  0-5 Units Subcutaneous QHS  . lisinopril  30 mg Oral Daily  . metoprolol tartrate  12.5 mg Oral BID  . potassium chloride  30 mEq Oral Q4H    Continuous Infusions: . sodium chloride Stopped (11/09/14 1300)  . heparin 1,600 Units/hr (11/10/14 0420)  . nitroGLYCERIN 20 mcg/min (11/10/14 0239)   [place], [place], CNSC [phone] Pager [phone] After Hours Pager

## 2014-11-10 NOTE — Progress Notes (Signed)
Subjective:  No CP other than chest wall pain from CPR, on IV NTG for HTN  Objective:  Temp:  [97.6 F (36.4 C)-97.9 F (36.6 C)] 97.7 F (36.5 C) (03/28 0417) Pulse Rate:  [66-89] 66 (03/28 0417) Resp:  [14-30] 22 (03/28 0700) BP: (147-248)/(47-120) 193/72 mmHg (03/28 0700) SpO2:  [91 %-100 %] 100 % (03/28 0417) Weight:  [220 lb 10.9 oz (100.1 kg)] 220 lb 10.9 oz (100.1 kg) (03/28 0423) Weight change: -4 lb 3 oz (-1.9 kg)  Intake/Output from previous day: 03/27 0701 - 03/28 0700 In: 1070 [P.O.:550; I.V.:520] Out: 1000 [Urine:1000]  Intake/Output from this shift:    Physical Exam: General appearance: alert and no distress Neck: no adenopathy, no carotid bruit, no JVD, supple, symmetrical, trachea midline and thyroid not enlarged, symmetric, no tenderness/mass/nodules Lungs: clear to auscultation bilaterally Heart: regular rate and rhythm, S1, S2 normal, no murmur, click, rub or gallop Extremities: extremities normal, atraumatic, no cyanosis or edema  Lab Results: Results for orders placed or performed during the hospital encounter of 10/29/14 (from the past 48 hour(s))  Glucose, capillary     Status: Abnormal   Collection Time: 11/08/14  8:49 AM  Result Value Ref Range   Glucose-Capillary 113 (H) 70 - 99 mg/dL  Glucose, capillary     Status: None   Collection Time: 11/08/14 11:42 AM  Result Value Ref Range   Glucose-Capillary 97 70 - 99 mg/dL   Comment 1 Capillary Specimen   Heparin level (unfractionated)     Status: Abnormal   Collection Time: 11/08/14 12:00 PM  Result Value Ref Range   Heparin Unfractionated 0.29 (L) 0.30 - 0.70 IU/mL    Comment:        IF HEPARIN RESULTS ARE BELOW EXPECTED VALUES, AND PATIENT DOSAGE HAS BEEN CONFIRMED, SUGGEST FOLLOW UP TESTING OF ANTITHROMBIN III LEVELS.   Glucose, capillary     Status: Abnormal   Collection Time: 11/08/14  3:32 PM  Result Value Ref Range   Glucose-Capillary 133 (H) 70 - 99 mg/dL   Comment 1  Capillary Specimen   Glucose, capillary     Status: Abnormal   Collection Time: 11/08/14  7:42 PM  Result Value Ref Range   Glucose-Capillary 107 (H) 70 - 99 mg/dL   Comment 1 Capillary Specimen   Heparin level (unfractionated)     Status: None   Collection Time: 11/08/14  8:20 PM  Result Value Ref Range   Heparin Unfractionated 0.40 0.30 - 0.70 IU/mL    Comment:        IF HEPARIN RESULTS ARE BELOW EXPECTED VALUES, AND PATIENT DOSAGE HAS BEEN CONFIRMED, SUGGEST FOLLOW UP TESTING OF ANTITHROMBIN III LEVELS.   Glucose, capillary     Status: Abnormal   Collection Time: 11/08/14 11:46 PM  Result Value Ref Range   Glucose-Capillary 113 (H) 70 - 99 mg/dL  Glucose, capillary     Status: Abnormal   Collection Time: 11/09/14  4:46 AM  Result Value Ref Range   Glucose-Capillary 111 (H) 70 - 99 mg/dL  CBC     Status: Abnormal   Collection Time: 11/09/14  5:01 AM  Result Value Ref Range   WBC 9.6 4.0 - 10.5 K/uL   RBC 3.99 3.87 - 5.11 MIL/uL   Hemoglobin 11.7 (L) 12.0 - 15.0 g/dL    Comment: SPECIMEN CHECKED FOR CLOTS REPEATED TO VERIFY    HCT 35.0 (L) 36.0 - 46.0 %   MCV 87.7 78.0 - 100.0 fL  MCH 29.3 26.0 - 34.0 pg   MCHC 33.4 30.0 - 36.0 g/dL   RDW 16.3 (H) 11.5 - 15.5 %   Platelets 101 (L) 150 - 400 K/uL    Comment: SPECIMEN CHECKED FOR CLOTS REPEATED TO VERIFY CONSISTENT WITH PREVIOUS RESULT   Basic metabolic panel     Status: Abnormal   Collection Time: 11/09/14  5:01 AM  Result Value Ref Range   Sodium 141 135 - 145 mmol/L   Potassium 2.9 (L) 3.5 - 5.1 mmol/L   Chloride 111 96 - 112 mmol/L   CO2 23 19 - 32 mmol/L   Glucose, Bld 107 (H) 70 - 99 mg/dL   BUN 13 6 - 23 mg/dL   Creatinine, Ser 0.95 0.50 - 1.10 mg/dL   Calcium 8.5 8.4 - 10.5 mg/dL   GFR calc non Af Amer 60 (L) >90 mL/min   GFR calc Af Amer 69 (L) >90 mL/min    Comment: (NOTE) The eGFR has been calculated using the CKD EPI equation. This calculation has not been validated in all clinical  situations. eGFR's persistently <90 mL/min signify possible Chronic Kidney Disease.    Anion gap 7 5 - 15  Magnesium     Status: None   Collection Time: 11/09/14  5:01 AM  Result Value Ref Range   Magnesium 2.0 1.5 - 2.5 mg/dL  Phosphorus     Status: None   Collection Time: 11/09/14  5:01 AM  Result Value Ref Range   Phosphorus 3.0 2.3 - 4.6 mg/dL  Heparin level (unfractionated)     Status: None   Collection Time: 11/09/14  5:02 AM  Result Value Ref Range   Heparin Unfractionated 0.39 0.30 - 0.70 IU/mL    Comment:        IF HEPARIN RESULTS ARE BELOW EXPECTED VALUES, AND PATIENT DOSAGE HAS BEEN CONFIRMED, SUGGEST FOLLOW UP TESTING OF ANTITHROMBIN III LEVELS.   Glucose, capillary     Status: None   Collection Time: 11/09/14  7:26 AM  Result Value Ref Range   Glucose-Capillary 99 70 - 99 mg/dL  Glucose, capillary     Status: None   Collection Time: 11/09/14 12:40 PM  Result Value Ref Range   Glucose-Capillary 98 70 - 99 mg/dL  Glucose, capillary     Status: Abnormal   Collection Time: 11/09/14  4:30 PM  Result Value Ref Range   Glucose-Capillary 106 (H) 70 - 99 mg/dL  Glucose, capillary     Status: Abnormal   Collection Time: 11/09/14 10:02 PM  Result Value Ref Range   Glucose-Capillary 102 (H) 70 - 99 mg/dL  Heparin level (unfractionated)     Status: None   Collection Time: 11/10/14  6:29 AM  Result Value Ref Range   Heparin Unfractionated 0.42 0.30 - 0.70 IU/mL    Comment:        IF HEPARIN RESULTS ARE BELOW EXPECTED VALUES, AND PATIENT DOSAGE HAS BEEN CONFIRMED, SUGGEST FOLLOW UP TESTING OF ANTITHROMBIN III LEVELS.     Imaging: Imaging results have been reviewed  Tele: NSR  Assessment/Plan:   1. Principal Problem: 2.   Cardiac arrest 3. Active Problems: 4.   HTN (hypertension) 5.   Atrial fibrillation 6.   CAP (community acquired pneumonia) 2.   Cardiomyopathy 8.   Hypokalemia 9.   Acute on chronic combined systolic and diastolic HF (heart  failure) 10.   NSTEMI (non-ST elevated myocardial infarction) 11.   SOB (shortness of breath) 12.   Atrial fibrillation with RVR  13.   PEA (Pulseless electrical activity) 14.   Acute respiratory failure with hypoxemia 15.   Anoxic brain injury 16.   Essential hypertension 17.   Cardiomyopathy, ischemic 18.   Coronary artery disease due to lipid rich plaque 19.   Accelerated hypertension 20.   Time Spent Directly with Patient:  20 minutes  Length of Stay:  LOS: 11 days   Pt admitted on 3/16. Had witnessed arrest/CPR/ artic sun. Cathed 3/22 by Dr Claiborne Billings revealing LM 80% with 80% LCX, non critical Dz LAD/RCA with moderate LV dysfunction (EF 35-40%). She was in Afib and has converted to NSR. TTVPM was D/Cd Friday. She is on IV Hep/NTG. SBP elevated and meds are being titrated. K 2.9----> repleted , will recheck. PT working with her. Will transfer to stepdown. I have asked Dr. Roxy Manns to re evaluate her for CABG.  Lorretta Harp 11/10/2014, 8:14 AM

## 2014-11-10 NOTE — Progress Notes (Signed)
ANTICOAGULATION CONSULT NOTE - Follow Up Consult  Pharmacy Consult for heparin Indication: atrial fibrillation  Allergies  Allergen Reactions  . Bee Venom Anaphylaxis  . Shrimp [Shellfish Allergy] Swelling    Patient Measurements: Height: 5' 7.5" (171.5 cm) Weight: 220 lb 10.9 oz (100.1 kg) IBW/kg (Calculated) : 62.75 Heparin Dosing Weight: 85 kg  Vital Signs: Temp: 97.7 F (36.5 C) (03/28 0417) Temp Source: Oral (03/28 0417) BP: 193/72 mmHg (03/28 0700) Pulse Rate: 66 (03/28 0417)  Labs:  Recent Labs  11/07/14 1050 11/08/14 0500  11/08/14 2020 11/09/14 0501 11/09/14 0502 11/10/14 0629  HGB 12.1 12.0  --   --  11.7*  --   --   HCT 36.3 36.7  --   --  35.0*  --   --   PLT 87* 92*  --   --  101*  --   --   HEPARINUNFRC  --  0.20*  < > 0.40  --  0.39 0.42  CREATININE  --  1.10  --   --  0.95  --   --   < > = values in this interval not displayed.  Estimated Creatinine Clearance: 68.6 mL/min (by C-G formula based on Cr of 0.95).   Medications:  Scheduled:  . antiseptic oral rinse  7 mL Mouth Rinse BID  . aspirin EC  81 mg Oral Daily  . atorvastatin  80 mg Oral q1800  . cloNIDine  0.2 mg Transdermal Weekly  . docusate  100 mg Oral Daily  . insulin aspart  0-15 Units Subcutaneous TID WC  . insulin aspart  0-5 Units Subcutaneous QHS  . lisinopril  20 mg Oral Daily  . metoprolol tartrate  12.5 mg Oral BID  . pantoprazole (PROTONIX) IV  40 mg Intravenous Q24H    Assessment: 69 year old female on IV heparin (apixaban on hold) for atrial fibrillation. She is s/p cardiac arrest and s/p cath noted with multivessel CAD.  Heparin is at 1600 units/hr and noted at goal (HL= 0.42).  Noted for possible CABG plans pending neurologic recovery.  Goal of Therapy:  Heparin level 0.3-0.7 units/ml Monitor platelets by anticoagulation protocol: Yes   Plan:  -No heparin changes need -Daily heparin level and CBC  Hildred Laser, Pharm D 11/10/2014 7:51 AM

## 2014-11-10 NOTE — Progress Notes (Signed)
Physical Therapy Treatment Patient Details Name: [name] [name] MRN: [id number] DOB: Mar 08, 1946 Today's Date: 11/10/2014    History of Present Illness 69 year old female with PMH which is significant for HTN and asthma. She presented to [facility] ED 3/16 c/o chest tightness and weakness x 1 week. In ED she was found to be in new onset AFib with RVR. She was seen by cardiology on day of admission who recommended anticoagulation. Echo showed a new cardiomyopathy with EF 35-45% and diffuse hypokineses. Troponin was mildly elevated but was a flat trend. She was taken for cath 3/21 but again developed acute onset SOB and tachypnea. She felt as though she could not breathe, however sats remained 100% on . She progressed to have a cardiac arrest, which those present at the time likely started as a respiratory arrest. Cath revealed 80% blockages and surgery being consulted to see if pt is a CABG candidate.  On initial PT evaluation pt still has external pacer.  Pt with significant PMHx of HTN, asthma, and arthritis.  Intubation 11/03/14- 11/07/14.    PT Comments    Patient progressing towards PT goals this session. Ambulated with RW and assist. Limited by fatigue. Patient very concerned re: BP, perseverating on this throughout session. Some slow processing throughout session cognitively, required increased cues to attend to tasks and problem solve. Will continue to monitor progress. OF NOTE: Patient denies SOB throughout session, reports mild dizziness which does not change despite activity.  HR fluctuating 110s-120s with activity, returned to 70s at rest.    Follow Up Recommendations  Home health PT;Supervision/Assistance - 24 hour     Equipment Recommendations  Rolling walker with 5" wheels    Recommendations for Other Services OT consult     Precautions / Restrictions Precautions Precautions: Fall;Other (comment) Precaution Comments: check BP Restrictions Weight Bearing Restrictions: No     Mobility  Bed Mobility Overal bed mobility: Needs Assistance Bed Mobility: Supine to Sit     Supine to sit: Min assist     General bed mobility comments: Min assist to support trunk to get to sitting EOB and help progress bil legs to EOB.    Transfers Overall transfer level: Needs assistance Equipment used: Rolling walker (2 wheeled) Transfers: Sit to/from Stand Sit to Stand: From elevated surface;+2 physical assistance;Min assist Stand pivot transfers: +2 physical assistance;Min assist;From elevated surface       General transfer comment: Bilateral assist for stability upon standing with RW  Ambulation/Gait Ambulation/Gait assistance: Min assist Ambulation Distance (Feet): 90 Feet Assistive device: Rolling walker (2 wheeled) Gait Pattern/deviations: Step-through pattern;Decreased stride length;Trunk flexed;Wide base of support Gait velocity: decreased Gait velocity interpretation: Below normal speed for age/gender General Gait Details: Patient ambulated with assist for stability and cues for direction to task. patient bumped into objects (door, cart and wall) during ambulation with RW, cues for environment. Limited by fatigue   Stairs            Wheelchair Mobility    Modified Rankin (Stroke Patients Only)       Balance   Sitting-balance support: Feet supported Sitting balance-Leahy Scale: Poor Sitting balance - Comments: Min assist at trunk seated EOB.  Pt unable to remove upper extremity support at this time.  Reports of lightheadedness initially upon sitting, however disapated quickly.    Standing balance support: Bilateral upper extremity supported Standing balance-Leahy Scale: Poor Standing balance comment: assist for stability despite use of RW  Cognition Arousal/Alertness: Awake/alert Behavior During Therapy: Anxious Overall Cognitive Status: Impaired/Different from baseline Area of Impairment: Problem solving              Problem Solving: Slow processing;Decreased initiation;Difficulty sequencing;Requires verbal cues;Requires tactile cues General Comments: Cued patient several times to sit EOB, reuired max VCs for initiation and to carry out task    Exercises      General Comments General comments (skin integrity, edema, etc.): BP monitored pre and post activity 142/61 MAP 84 pre ambulation,  135/62 (86) post activity OF NOTE: patient received BP meds prior to activity      Pertinent Vitals/Pain Pain Assessment: 0-10 Pain Score: 4  Pain Location: head Pain Descriptors / Indicators: Headache Pain Intervention(s): Monitored during session    Home Living                      Prior Function            PT Goals (current goals can now be found in the care plan section) Acute Rehab PT Goals Patient Stated Goal: to get out of here ASAP PT Goal Formulation: With patient Time For Goal Achievement: 11/22/14 Potential to Achieve Goals: Good Progress towards PT goals: Progressing toward goals    Frequency  Min 3X/week    PT Plan Current plan remains appropriate    Co-evaluation             End of Session Equipment Utilized During Treatment: Gait belt;Oxygen Activity Tolerance: Patient limited by fatigue;Treatment limited secondary to medical complications (Comment) Patient left: in chair;with call bell/phone within reach     Time: 0809-0842 PT Time Calculation (min) (ACUTE ONLY): 33 min  Charges:  $Gait Training: 8-22 mins $Therapeutic Activity: 8-22 mins                    G CodesDuncan Dull 22-Nov-2014, 12:03 PM Alben Deeds, [place] DPT  [phone]

## 2014-11-10 NOTE — Progress Notes (Addendum)
Speech Language Pathology Treatment: Dysphagia  Patient Details Name: [name] [name] MRN: [id number] DOB: [birth year] Today's Date: 11/10/2014 Time: 1027-2536 SLP Time Calculation (min) (ACUTE ONLY): 20 min  Assessment / Plan / Recommendation Clinical Impression  Pt demonstrated no overt s/s of aspiration throughout dysphagia treatment today. Pt continues to fatigue very easily and required breaks between bites/ sips to catch breath. Suspect mildly delayed swallow initiation but adequate hyolaryngeal excursion. Recommend upgrading diet to dysphagia 3/ thin liquids, continue meds whole in puree, full supervision to cue pt to take small bites/ sips, no straws. SLP will continue to follow closely for diet tolerance/ advancement.   HPI HPI: 69 y.o. year old female with significant past medical history of HTN, asthma, A-fib admitted with new onset atrial fibrillation and CAP. Per MD note pt also reports URI sxs over past 2-3 weeks. CXR 10/29/14. Repeat CXR revealed small right pleural effusion noted. Bibasilar airspace opacification may reflect pneumonia or possibly mildly asymmetric interstitial edema, right greater than left. This is mildly improved on the left and mildly worsened on the right since the prior study. Required emergent central venous catheter placement 3/21, intubated 3/21-3/25. Barium esophagram 02/14/12 revealed small hiatal hernia with moderate reflux, very mild distal esophageal narrowing.he pill does lodge here temporarily but does pass into the stomach intact.   Pertinent Vitals Pain Assessment: No/denies pain  SLP Plan  Continue with current plan of care    Recommendations Diet recommendations: Dysphagia 3 (mechanical soft);Thin liquid Liquids provided via: Cup;No straw Medication Administration: Whole meds with puree Supervision: Full supervision/cueing for compensatory strategies;Patient able to self feed Compensations: Slow rate;Small sips/bites Postural Changes and/or Swallow  Maneuvers: Seated upright 90 degrees;Upright 30-60 min after meal              Oral Care Recommendations: Oral care BID Follow up Recommendations: Other (comment) (TBD) Plan: Continue with current plan of care    GO     Kern Reap, [place], CCC-SLP 11/10/2014, 3:42 PM

## 2014-11-11 DIAGNOSIS — I25110 Atherosclerotic heart disease of native coronary artery with unstable angina pectoris: Secondary | ICD-10-CM

## 2014-11-11 LAB — BASIC METABOLIC PANEL
Anion gap: 12 (ref 5–15)
BUN: 11 mg/dL (ref 6–23)
CO2: 20 mmol/L (ref 19–32)
Calcium: 9.3 mg/dL (ref 8.4–10.5)
Chloride: 110 mmol/L (ref 96–112)
Creatinine, Ser: 1.04 mg/dL (ref 0.50–1.10)
GFR calc Af Amer: 62 mL/min — ABNORMAL LOW (ref >90)
GFR calc non Af Amer: 54 mL/min — ABNORMAL LOW (ref >90)
Glucose, Bld: 107 mg/dL — ABNORMAL HIGH (ref 70–99)
Potassium: 3.4 mmol/L — ABNORMAL LOW (ref 3.5–5.1)
Sodium: 142 mmol/L (ref 135–145)

## 2014-11-11 LAB — CBC
HCT: 39.6 % (ref 36.0–46.0)
Hemoglobin: 13.4 g/dL (ref 12.0–15.0)
MCH: 29.8 pg (ref 26.0–34.0)
MCHC: 33.8 g/dL (ref 30.0–36.0)
MCV: 88 fL (ref 78.0–100.0)
Platelets: 126 10*3/uL — ABNORMAL LOW (ref 150–400)
RBC: 4.5 MIL/uL (ref 3.87–5.11)
RDW: 16.5 % — ABNORMAL HIGH (ref 11.5–15.5)
WBC: 12.1 10*3/uL — ABNORMAL HIGH (ref 4.0–10.5)

## 2014-11-11 LAB — GLUCOSE, CAPILLARY
Glucose-Capillary: 105 mg/dL — ABNORMAL HIGH (ref 70–99)
Glucose-Capillary: 108 mg/dL — ABNORMAL HIGH (ref 70–99)
Glucose-Capillary: 115 mg/dL — ABNORMAL HIGH (ref 70–99)
Glucose-Capillary: 117 mg/dL — ABNORMAL HIGH (ref 70–99)

## 2014-11-11 LAB — NEURON-SPECIFIC ENOLASE(NSE), BLOOD
Neuron Specific Enolase: 26.9 ng/mL — ABNORMAL HIGH (ref <10.8)
Neuron Specific Enolase: 19.1 ng/mL — ABNORMAL HIGH (ref <10.8)

## 2014-11-11 LAB — HEPARIN LEVEL (UNFRACTIONATED): Heparin Unfractionated: 0.36 IU/mL (ref 0.30–0.70)

## 2014-11-11 MED ORDER — POTASSIUM CHLORIDE 20 MEQ/15ML (10%) PO SOLN
40.0000 meq | Freq: Once | ORAL | Status: AC
  Administered 2014-11-11: 40 meq via ORAL
  Filled 2014-11-11 (×2): qty 30

## 2014-11-11 MED ORDER — POTASSIUM CHLORIDE CRYS ER 20 MEQ PO TBCR: 40.0000 meq | EXTENDED_RELEASE_TABLET | Freq: Once | ORAL | Status: DC

## 2014-11-11 MED ORDER — METOPROLOL TARTRATE 25 MG PO TABS
25.0000 mg | ORAL_TABLET | Freq: Two times a day (BID) | ORAL | Status: DC
  Administered 2014-11-11: 25 mg via ORAL
  Filled 2014-11-11 (×3): qty 1

## 2014-11-11 MED ORDER — LISINOPRIL 40 MG PO TABS
40.0000 mg | ORAL_TABLET | Freq: Every day | ORAL | Status: DC
  Administered 2014-11-12 – 2014-11-13 (×2): 40 mg via ORAL
  Filled 2014-11-11 (×3): qty 1

## 2014-11-11 NOTE — Progress Notes (Addendum)
CARDIAC REHAB PHASE I   PRE:  Rate/Rhythm: 95 SR with PAC's  BP:  Supine:   Sitting: 182/89  Standing:    SaO2: 98 2L  MODE:  Ambulation: 120 ft   POST:  Rate/Rhythm: 81 SR with PAC's  BP:  Supine:   Sitting: 183/117 recheck after rest 189/96  Standing:    SaO2: 99 2L 1315-1400 On arrival pt sitting on side of bed. Pt c/o of left knee pain and was reluctant to walk. She states that the PAS hose caused her knee to swell. I was able to ta;lk pt into trying to do what she was able to. Assisted x 2 used walker and O2 2L to ambulate. Gait steady with walker. Pt pushed to far before taking steps. She was able to walk 120 feet. BP elevated after walk. Pt to bed after walk with call light in reach.  Rodney Langton RN 11/11/2014 1:57 PM

## 2014-11-11 NOTE — Progress Notes (Signed)
Speech Language Pathology Treatment: Dysphagia  Patient Details Name: [name] [name] MRN: [id number] DOB: 1946-06-20 Today's Date: 11/11/2014 Time: 3009-2330 SLP Time Calculation (min) (ACUTE ONLY): 13 min  Assessment / Plan / Recommendation Clinical Impression  Follow-up for dysphagia treatment. PO trials with thin liquids and purees did not elicit s/s of aspiration; clear vocal quality. SLP noted increase in RR rate when pt used fast rate during meal. Practiced taking small sip/bites and pausing between bites/sips of food. Recommended pt use slow rate, take breaks between bites/sips of food, stay upright during bed and stay upright for at least 30 minutes following meal .Recommend pt initiate reg/ thin liquid diet. Speech will follow-up x1 for diet tolerance.    HPI HPI: 69 y.o. year old female with significant past medical history of HTN, asthma, A-fib admitted with new onset atrial fibrillation and CAP. Per MD note pt also reports URI sxs over past 2-3 weeks. CXR 10/29/14. Repeat CXR revealed small right pleural effusion noted. Bibasilar airspace opacification may reflect pneumonia or possibly mildly asymmetric interstitial edema, right greater than left. This is mildly improved on the left and mildly worsened on the right since the prior study. Required emergent central venous catheter placement 3/21, intubated 3/21-3/25. Barium esophagram 02/14/12 revealed small hiatal hernia with moderate reflux, very mild distal esophageal narrowing.he pill does lodge here temporarily but does pass into the stomach intact.   Pertinent Vitals Pain Assessment: Faces Faces Pain Scale: Hurts little more Pain Location: left knee  SLP Plan  Continue with current plan of care    Recommendations Diet recommendations: Regular;Thin liquid Liquids provided via: Cup;No straw Medication Administration: Whole meds with puree Supervision: Intermittent supervision to cue for compensatory strategies;Patient able to self  feed Compensations: Slow rate;Small sips/bites (Pauses between sips and bites.) Postural Changes and/or Swallow Maneuvers: Seated upright 90 degrees;Upright 30-60 min after meal              Oral Care Recommendations: Oral care BID Plan: Continue with current plan of care    GO     Bermudez-Bosch, Austynn Pridmore 11/11/2014, 9:15 AM

## 2014-11-11 NOTE — Progress Notes (Addendum)
Subjective:  No CP/SOB. Ambulating w/o difficulty with PT  Objective:  Temp:  [97.4 F (36.3 C)-98.6 F (37 C)] 97.6 F (36.4 C) (03/29 0819) Pulse Rate:  [76-87] 76 (03/29 0725) Resp:  [16-43] 35 (03/29 0819) BP: (133-233)/(48-104) 186/87 mmHg (03/29 0819) SpO2:  [92 %-100 %] 94 % (03/29 0725) Weight:  [213 lb 4.8 oz (96.752 kg)] 213 lb 4.8 oz (96.752 kg) (03/29 0400) Weight change: -7 lb 6.1 oz (-3.348 kg)  Intake/Output from previous day: 03/28 0701 - 03/29 0700 In: 460 [I.V.:460] Out: 525 [Urine:525]  Intake/Output from this shift: Total I/O In: 16 [I.V.:16] Out: -   Physical Exam: General appearance: alert and no distress Neck: no adenopathy, no carotid bruit, no JVD, supple, symmetrical, trachea midline and thyroid not enlarged, symmetric, no tenderness/mass/nodules Lungs: clear to auscultation bilaterally Heart: regular rate and rhythm, S1, S2 normal, no murmur, click, rub or gallop Extremities: extremities normal, atraumatic, no cyanosis or edema  Lab Results: Results for orders placed or performed during the hospital encounter of 10/29/14 (from the past 48 hour(s))  Glucose, capillary     Status: None   Collection Time: 11/09/14 12:40 PM  Result Value Ref Range   Glucose-Capillary 98 70 - 99 mg/dL  Glucose, capillary     Status: Abnormal   Collection Time: 11/09/14  4:30 PM  Result Value Ref Range   Glucose-Capillary 106 (H) 70 - 99 mg/dL  Glucose, capillary     Status: Abnormal   Collection Time: 11/09/14 10:02 PM  Result Value Ref Range   Glucose-Capillary 102 (H) 70 - 99 mg/dL  Heparin level (unfractionated)     Status: None   Collection Time: 11/10/14  6:29 AM  Result Value Ref Range   Heparin Unfractionated 0.42 0.30 - 0.70 IU/mL    Comment:        IF HEPARIN RESULTS ARE BELOW EXPECTED VALUES, AND PATIENT DOSAGE HAS BEEN CONFIRMED, SUGGEST FOLLOW UP TESTING OF ANTITHROMBIN III LEVELS.   Glucose, capillary     Status: None   Collection  Time: 11/10/14  8:12 AM  Result Value Ref Range   Glucose-Capillary 99 70 - 99 mg/dL  Basic metabolic panel     Status: Abnormal   Collection Time: 11/10/14  8:22 AM  Result Value Ref Range   Sodium 143 135 - 145 mmol/L   Potassium 3.4 (L) 3.5 - 5.1 mmol/L   Chloride 115 (H) 96 - 112 mmol/L   CO2 22 19 - 32 mmol/L   Glucose, Bld 107 (H) 70 - 99 mg/dL   BUN 11 6 - 23 mg/dL   Creatinine, Ser 0.97 0.50 - 1.10 mg/dL   Calcium 9.0 8.4 - 10.5 mg/dL   GFR calc non Af Amer 58 (L) >90 mL/min   GFR calc Af Amer 68 (L) >90 mL/min    Comment: (NOTE) The eGFR has been calculated using the CKD EPI equation. This calculation has not been validated in all clinical situations. eGFR's persistently <90 mL/min signify possible Chronic Kidney Disease.    Anion gap 6 5 - 15  Glucose, capillary     Status: None   Collection Time: 11/10/14  5:29 PM  Result Value Ref Range   Glucose-Capillary 98 70 - 99 mg/dL  Glucose, capillary     Status: Abnormal   Collection Time: 11/10/14  9:10 PM  Result Value Ref Range   Glucose-Capillary 118 (H) 70 - 99 mg/dL   Comment 1 Capillary Specimen   Heparin level (unfractionated)  Status: None   Collection Time: 11/11/14  2:30 AM  Result Value Ref Range   Heparin Unfractionated 0.36 0.30 - 0.70 IU/mL    Comment:        IF HEPARIN RESULTS ARE BELOW EXPECTED VALUES, AND PATIENT DOSAGE HAS BEEN CONFIRMED, SUGGEST FOLLOW UP TESTING OF ANTITHROMBIN III LEVELS.   Basic metabolic panel     Status: Abnormal   Collection Time: 11/11/14  2:30 AM  Result Value Ref Range   Sodium 142 135 - 145 mmol/L   Potassium 3.4 (L) 3.5 - 5.1 mmol/L   Chloride 110 96 - 112 mmol/L   CO2 20 19 - 32 mmol/L   Glucose, Bld 107 (H) 70 - 99 mg/dL   BUN 11 6 - 23 mg/dL   Creatinine, Ser 1.04 0.50 - 1.10 mg/dL   Calcium 9.3 8.4 - 10.5 mg/dL   GFR calc non Af Amer 54 (L) >90 mL/min   GFR calc Af Amer 62 (L) >90 mL/min    Comment: (NOTE) The eGFR has been calculated using the CKD EPI  equation. This calculation has not been validated in all clinical situations. eGFR's persistently <90 mL/min signify possible Chronic Kidney Disease.    Anion gap 12 5 - 15  CBC     Status: Abnormal   Collection Time: 11/11/14  2:30 AM  Result Value Ref Range   WBC 12.1 (H) 4.0 - 10.5 K/uL   RBC 4.50 3.87 - 5.11 MIL/uL   Hemoglobin 13.4 12.0 - 15.0 g/dL   HCT 39.6 36.0 - 46.0 %   MCV 88.0 78.0 - 100.0 fL   MCH 29.8 26.0 - 34.0 pg   MCHC 33.8 30.0 - 36.0 g/dL   RDW 16.5 (H) 11.5 - 15.5 %   Platelets 126 (L) 150 - 400 K/uL    Comment: SPECIMEN CHECKED FOR CLOTS REPEATED TO VERIFY PLATELET COUNT CONFIRMED BY SMEAR   Glucose, capillary     Status: Abnormal   Collection Time: 11/11/14  7:41 AM  Result Value Ref Range   Glucose-Capillary 105 (H) 70 - 99 mg/dL   Comment 1 Capillary Specimen     Imaging: Imaging results have been reviewed  Tele- NSR  Assessment/Plan:   1. Principal Problem: 2.   Cardiac arrest 3. Active Problems: 4.   HTN (hypertension) 5.   Atrial fibrillation 6.   CAP (community acquired pneumonia) 33.   Cardiomyopathy 8.   Hypokalemia 9.   Acute on chronic combined systolic and diastolic HF (heart failure) 10.   NSTEMI (non-ST elevated myocardial infarction) 11.   SOB (shortness of breath) 12.   Atrial fibrillation with RVR 13.   PEA (Pulseless electrical activity) 14.   Acute respiratory failure with hypoxemia 15.   Anoxic brain injury 16.   Essential hypertension 17.   Cardiomyopathy, ischemic 18.   Coronary artery disease due to lipid rich plaque 19.   Accelerated hypertension 20.   Time Spent Directly with Patient:  20 minutes  Length of Stay:  LOS: 12 days   1. ISCM- Day 12 SCD , [place]. Cath revealing LM/2 VD by Dr. Claiborne Billings. EF was 35-40%. She has made a remarkable recovery. On IV hep. No CP/SOB. Will re check 2D for LV fxn. I have asked Dr. Roxy Manns to re evaluate for CABG/ revasc.  2. HTN- Meds being adjusted. Titrate ACE-I and BB.  Renal FXN OK  3. Hypokalemia- Replete  4. PAF- Early in Hospitalization thought to be secondary to PNA. NSR now. Converted on  3/27 NSR. Continue to monitor prior to making decision post op re NOAC  Lorretta Harp 11/11/2014, 9:31 AM

## 2014-11-11 NOTE — Progress Notes (Signed)
[address]       [place] [address]             [phone]     CARDIOTHORACIC SURGERY PROGRESS NOTE  7 Days Post-Op  S/P Procedure(s) (LRB): LEFT HEART CATHETERIZATION WITH CORONARY ANGIOGRAM (N/A) TEMPORARY PACEMAKER INSERTION  Subjective: Feels okay.  Denies chest pain, SOB.  Reports no recollection of events around the time of her hospitalization or subsequent cardiac arrest.  Objective: Vital signs in last 24 hours: Temp:  [97.3 F (36.3 C)-98.6 F (37 C)] 97.6 F (36.4 C) (03/29 1633) Pulse Rate:  [69-87] 69 (03/29 1633) Cardiac Rhythm:  [-] Normal sinus rhythm (03/29 1633) Resp:  [18-35] 25 (03/29 1633) BP: (133-233)/(48-93) 172/90 mmHg (03/29 1633) SpO2:  [92 %-97 %] 97 % (03/29 1633) Weight:  [96.752 kg (213 lb 4.8 oz)] 96.752 kg (213 lb 4.8 oz) (03/29 0400)  Physical Exam:  Rhythm:   sinus  Breath sounds: Diminished at bases  Heart sounds:  RRR w/out murmur  Incisions:  n/a  Abdomen:  soft  Extremities:  warm   Intake/Output from previous day: 03/28 0701 - 03/29 0700 In: 460 [I.V.:460] Out: 525 [Urine:525] Intake/Output this shift:    Lab Results:  Recent Labs  11/09/14 0501 11/11/14 0230  WBC 9.6 12.1*  HGB 11.7* 13.4  HCT 35.0* 39.6  PLT 101* 126*   BMET:  Recent Labs  11/10/14 0822 11/11/14 0230  NA 143 142  K 3.4* 3.4*  CL 115* 110  CO2 22 20  GLUCOSE 107* 107*  BUN 11 11  CREATININE 0.97 1.04  CALCIUM 9.0 9.3    CBG (last 3)   Recent Labs  11/11/14 0741 11/11/14 1202 11/11/14 1651  GLUCAP 105* 108* 115*   PT/INR:  No results for input(s): LABPROT, INR in the last 72 hours.  CXR:  N/A  Assessment/Plan: S/P Procedure(s) (LRB): LEFT HEART CATHETERIZATION WITH CORONARY ANGIOGRAM (N/A) TEMPORARY PACEMAKER INSERTION  Patient appears to be recovering from the events of the past 7-10 days.  She has been maintaining NSR for the most part with some recurrent PAF.  Her CHF appears to be resolving.   Blood pressure remains somewhat high.  Follow up echocardiogram pending.  It will be very important to reassess severity of LV dysfunction and mitral regurgitation before making definitive plans for surgery.    Long term plans for management of PAF and HTN remain somewhat unclear.  Would she be a candidate for long term anticoagulation?  I am somewhat reluctant to consider this patient a candidate for maze procedure.  The patient is not currently on amiodarone.  Based upon the patient's clinical presentation and cath findings, I agree that she probably should undergo coronary artery bypass grafting during this hospitalization.  I have reviewed the indications, risks, and potential benefits of coronary artery bypass grafting with the patient, her daughter, and her grandson at the bedside this evening.  Alternative treatment strategies have been discussed.  The patient understands and accepts all potential associated risks of surgery including but not limited to risk of death, stroke or other neurologic complication, myocardial infarction, congestive heart failure, respiratory failure, renal failure, bleeding requiring blood transfusion and/or reexploration, aortic dissection or other major vascular complication, arrhythmia, heart block or bradycardia requiring permanent pacemaker, pneumonia, pleural effusion, wound infection, pulmonary embolus or other thromboembolic complication, chronic pain or other delayed complications related to median sternotomy, or the late recurrence of symptomatic ischemic heart disease and/or congestive heart  failure.  The importance of long term risk modification have been emphasized.  At present she seems agreeable with surgery, but she wants to think matters over before making a final decision.    We could potentially proceed with surgery on Friday April 1 or some time next week, depending upon her progress and subsequent diagnostic tests.  All questions answered.   I spent in  excess of 45 minutes during the conduct of this hospital encounter and >50% of this time involved direct face-to-face encounter with the patient for counseling and/or coordination of their care.   [name] 11/11/2014 7:59 PM

## 2014-11-11 NOTE — Progress Notes (Signed)
Physical Therapy Treatment Patient Details Name: [name] [name] MRN: [id number] DOB: 01-27-1946 Today's Date: 11/11/2014    History of Present Illness 69 year old female with PMH which is significant for HTN and asthma. She presented to [facility] ED 3/16 c/o chest tightness and weakness x 1 week. In ED she was found to be in new onset AFib with RVR. She was seen by cardiology on day of admission who recommended anticoagulation. Echo showed a new cardiomyopathy with EF 35-45% and diffuse hypokineses. Troponin was mildly elevated but was a flat trend. She was taken for cath 3/21 but again developed acute onset SOB and tachypnea. She felt as though she could not breathe, however sats remained 100% on [place]. She progressed to have a cardiac arrest, which those present at the time likely started as a respiratory arrest. Cath revealed 80% blockages and surgery being consulted to see if pt is a CABG candidate.  On initial PT evaluation pt still has external pacer.  Pt with significant PMHx of HTN, asthma, and arthritis.  Intubation 11/03/14- 11/07/14.    PT Comments    Patient with continued confusion and slow problem solving and processing at times. Limited mobility this session secondary to left knee pain, anterior distal to patella, tender to palpation and increased edema. Tolerated OOB to chair with assist. Performed PROM and stretches for comfort. Will continue to see and progress as tolerated.    Follow Up Recommendations  Home health PT;Supervision/Assistance - 24 hour (if 24/7 not available may need to consider [facility])     Equipment Recommendations  Rolling walker with 5" wheels    Recommendations for Other Services OT consult     Precautions / Restrictions Precautions Precautions: Fall;Other (comment) Precaution Comments: check BP Restrictions Weight Bearing Restrictions: No    Mobility  Bed Mobility Overal bed mobility: Needs Assistance Bed Mobility: Supine to Sit     Supine to sit: Min  assist     General bed mobility comments: Min assist to pull to upright at EOB  Transfers Overall transfer level: Needs assistance Equipment used: Rolling walker (2 wheeled)   Sit to Stand: From elevated surface;+2 physical assistance;Min assist Stand pivot transfers: Min assist       General transfer comment: Min assist to come to standing for stability, VCs for hand placement  Ambulation/Gait Ambulation/Gait assistance: Min assist Ambulation Distance (Feet): 20 Feet Assistive device: Rolling walker (2 wheeled) Gait Pattern/deviations: Step-to pattern;Decreased stride length;Antalgic Gait velocity: decreased Gait velocity interpretation: Below normal speed for age/gender General Gait Details: reports pain in left knee with mobilization w/bing   Stairs            Wheelchair Mobility    Modified Rankin (Stroke Patients Only)       Balance     Sitting balance-Leahy Scale: Poor Sitting balance - Comments: Min assist at trunk seated EOB.  Pt unable to remove upper extremity support at this time.  Reports of lightheadedness initially upon sitting, however disapated quickly.    Standing balance support: Bilateral upper extremity supported Standing balance-Leahy Scale: Poor                      Cognition Arousal/Alertness: Awake/alert Behavior During Therapy: Anxious Overall Cognitive Status: Impaired/Different from baseline Area of Impairment: Problem solving             Problem Solving: Slow processing;Decreased initiation;Difficulty sequencing;Requires verbal cues;Requires tactile cues General Comments: patient remains slow with processing and problem solving, cues for  attention to task.    Exercises Total Joint Exercises Knee Flexion: PROM;Left;10 reps (Heel cord stretch 5x 10 sec hold, assessment of left knee)    General Comments General comments (skin integrity, edema, etc.): Left knee tender to palpation, limited ROM, poor tolerance for  mobility and weight bearing, nsg aware      Pertinent Vitals/Pain Pain Assessment: 0-10 Pain Score: 6  Faces Pain Scale: Hurts little more Pain Location: left knee Pain Descriptors / Indicators: Aching Pain Intervention(s): Monitored during session;Repositioned;Heat applied    Home Living                      Prior Function            PT Goals (current goals can now be found in the care plan section) Acute Rehab PT Goals Patient Stated Goal: to get out of here ASAP PT Goal Formulation: With patient Time For Goal Achievement: 11/22/14 Potential to Achieve Goals: Good Progress towards PT goals: Progressing toward goals    Frequency  Min 3X/week    PT Plan Current plan remains appropriate    Co-evaluation             End of Session Equipment Utilized During Treatment: Gait belt;Oxygen Activity Tolerance: Patient limited by fatigue;Treatment limited secondary to medical complications (Comment) Patient left: in chair;with call bell/phone within reach     Time: 0930-0954 PT Time Calculation (min) (ACUTE ONLY): 24 min  Charges:  $Therapeutic Exercise: 8-22 mins $Therapeutic Activity: 8-22 mins                    G CodesDuncan Dull 2014/11/23, 10:10 AM Alben Deeds, PT DPT  [phone]

## 2014-11-11 NOTE — Progress Notes (Signed)
ANTICOAGULATION CONSULT NOTE - Follow Up Consult  Pharmacy Consult for heparin Indication: atrial fibrillation  Allergies  Allergen Reactions  . Bee Venom Anaphylaxis  . Shrimp [Shellfish Allergy] Swelling    Patient Measurements: Height: 5' 7.5" (171.5 cm) Weight: 213 lb 4.8 oz (96.752 kg) IBW/kg (Calculated) : 62.75 Heparin Dosing Weight: 85 kg  Vital Signs: Temp: 97.6 F (36.4 C) (03/29 0819) Temp Source: Oral (03/29 0819) BP: 186/87 mmHg (03/29 0819) Pulse Rate: 76 (03/29 0725)  Labs:  Recent Labs  11/09/14 0501 11/09/14 0502 11/10/14 0629 11/10/14 0822 11/11/14 0230  HGB 11.7*  --   --   --  13.4  HCT 35.0*  --   --   --  39.6  PLT 101*  --   --   --  126*  HEPARINUNFRC  --  0.39 0.42  --  0.36  CREATININE 0.95  --   --  0.97 1.04    Estimated Creatinine Clearance: 61.6 mL/min (by C-G formula based on Cr of 1.04).   Medications:  Scheduled:  . antiseptic oral rinse  7 mL Mouth Rinse BID  . aspirin EC  81 mg Oral Daily  . atorvastatin  80 mg Oral q1800  . cloNIDine  0.2 mg Transdermal Weekly  . docusate  100 mg Oral Daily  . feeding supplement (ENSURE ENLIVE)  237 mL Oral BID BM  . hydrochlorothiazide  12.5 mg Oral Daily  . insulin aspart  0-15 Units Subcutaneous TID WC  . insulin aspart  0-5 Units Subcutaneous QHS  . [START ON 11/12/2014] lisinopril  40 mg Oral Daily  . metoprolol tartrate  25 mg Oral BID    Assessment: 69 year old female on IV heparin (apixaban on hold) for atrial fibrillation and she has converted to NSR. She is s/p cardiac arrest and s/p cath noted with multivessel CAD.  Heparin is at 1600 units/hr and noted at goal (HL= 0.36).  Noted for possible CABG.  Goal of Therapy:  Heparin level 0.3-0.7 units/ml Monitor platelets by anticoagulation protocol: Yes   Plan:  -No heparin changes need -Daily heparin level and CBC  Hildred Laser, Pharm D 11/11/2014 10:51 AM

## 2014-11-12 ENCOUNTER — Inpatient Hospital Stay (HOSPITAL_COMMUNITY): Payer: Commercial Managed Care - HMO

## 2014-11-12 ENCOUNTER — Other Ambulatory Visit: Payer: Self-pay | Admitting: *Deleted

## 2014-11-12 DIAGNOSIS — I469 Cardiac arrest, cause unspecified: Secondary | ICD-10-CM

## 2014-11-12 DIAGNOSIS — I2510 Atherosclerotic heart disease of native coronary artery without angina pectoris: Secondary | ICD-10-CM

## 2014-11-12 LAB — BLOOD GAS, ARTERIAL
Acid-base deficit: 2 mmol/L (ref 0.0–2.0)
Bicarbonate: 20.6 mEq/L (ref 20.0–24.0)
Drawn by: 347621
FIO2: 0.28 %
O2 Content: 2 L/min
O2 Saturation: 97.1 %
Patient temperature: 98.6
TCO2: 21.4 mmol/L (ref 0–100)
pCO2 arterial: 25.8 mmHg — ABNORMAL LOW (ref 35.0–45.0)
pH, Arterial: 7.514 — ABNORMAL HIGH (ref 7.350–7.450)
pO2, Arterial: 91.1 mmHg (ref 80.0–100.0)

## 2014-11-12 LAB — URINALYSIS, ROUTINE W REFLEX MICROSCOPIC
Bilirubin Urine: NEGATIVE
Glucose, UA: NEGATIVE mg/dL
Hgb urine dipstick: NEGATIVE
Ketones, ur: NEGATIVE mg/dL
Leukocytes, UA: NEGATIVE
Nitrite: NEGATIVE
Protein, ur: NEGATIVE mg/dL
Specific Gravity, Urine: 1.015 (ref 1.005–1.030)
Urobilinogen, UA: 1 mg/dL (ref 0.0–1.0)
pH: 5.5 (ref 5.0–8.0)

## 2014-11-12 LAB — PULMONARY FUNCTION TEST
FEF 25-75 Pre: 0.65 L/sec
FEF2575-%Pred-Pre: 33 %
FEV1-%Pred-Pre: 70 %
FEV1-Pre: 1.52 L
FEV1FVC-%Pred-Pre: 92 %
FEV6-%Pred-Pre: 79 %
FEV6-Pre: 2.12 L
FEV6FVC-%Pred-Pre: 103 %
FVC-%Pred-Pre: 76 %
FVC-Pre: 2.12 L
Pre FEV1/FVC ratio: 72 %
Pre FEV6/FVC Ratio: 100 %

## 2014-11-12 LAB — LIPID PANEL
Cholesterol: 116 mg/dL (ref 0–200)
HDL: 34 mg/dL — ABNORMAL LOW (ref >39)
LDL Cholesterol: 60 mg/dL (ref 0–99)
Total CHOL/HDL Ratio: 3.4 RATIO
Triglycerides: 111 mg/dL (ref <150)
VLDL: 22 mg/dL (ref 0–40)

## 2014-11-12 LAB — GLUCOSE, CAPILLARY
Glucose-Capillary: 110 mg/dL — ABNORMAL HIGH (ref 70–99)
Glucose-Capillary: 92 mg/dL (ref 70–99)

## 2014-11-12 LAB — CBC
HCT: 37.1 % (ref 36.0–46.0)
Hemoglobin: 12.3 g/dL (ref 12.0–15.0)
MCH: 29.4 pg (ref 26.0–34.0)
MCHC: 33.2 g/dL (ref 30.0–36.0)
MCV: 88.5 fL (ref 78.0–100.0)
Platelets: 122 10*3/uL — ABNORMAL LOW (ref 150–400)
RBC: 4.19 MIL/uL (ref 3.87–5.11)
RDW: 16.7 % — ABNORMAL HIGH (ref 11.5–15.5)
WBC: 11.2 10*3/uL — ABNORMAL HIGH (ref 4.0–10.5)

## 2014-11-12 LAB — COMPREHENSIVE METABOLIC PANEL
ALT: 66 U/L — ABNORMAL HIGH (ref 0–35)
AST: 84 U/L — ABNORMAL HIGH (ref 0–37)
Albumin: 3 g/dL — ABNORMAL LOW (ref 3.5–5.2)
Alkaline Phosphatase: 124 U/L — ABNORMAL HIGH (ref 39–117)
Anion gap: 8 (ref 5–15)
BUN: 11 mg/dL (ref 6–23)
CO2: 23 mmol/L (ref 19–32)
Calcium: 9.2 mg/dL (ref 8.4–10.5)
Chloride: 111 mmol/L (ref 96–112)
Creatinine, Ser: 1.13 mg/dL — ABNORMAL HIGH (ref 0.50–1.10)
GFR calc Af Amer: 56 mL/min — ABNORMAL LOW (ref >90)
GFR calc non Af Amer: 48 mL/min — ABNORMAL LOW (ref >90)
Glucose, Bld: 108 mg/dL — ABNORMAL HIGH (ref 70–99)
Potassium: 3.5 mmol/L (ref 3.5–5.1)
Sodium: 142 mmol/L (ref 135–145)
Total Bilirubin: 0.5 mg/dL (ref 0.3–1.2)
Total Protein: 6.1 g/dL (ref 6.0–8.3)

## 2014-11-12 LAB — HEPARIN LEVEL (UNFRACTIONATED): Heparin Unfractionated: 0.4 IU/mL (ref 0.30–0.70)

## 2014-11-12 MED ORDER — AMIODARONE LOAD VIA INFUSION
150.0000 mg | Freq: Once | INTRAVENOUS | Status: AC
  Administered 2014-11-12: 150 mg via INTRAVENOUS
  Filled 2014-11-12: qty 83.34

## 2014-11-12 MED ORDER — AMIODARONE HCL IN DEXTROSE 360-4.14 MG/200ML-% IV SOLN
60.0000 mg/h | INTRAVENOUS | Status: AC
Start: 2014-11-12 — End: 2014-11-12
  Administered 2014-11-12 (×2): 60 mg/h via INTRAVENOUS
  Filled 2014-11-12 (×2): qty 200

## 2014-11-12 MED ORDER — METOPROLOL TARTRATE 1 MG/ML IV SOLN
INTRAVENOUS | Status: AC
Start: 2014-11-12 — End: 2014-11-12
  Administered 2014-11-12: 5 mg via INTRAVENOUS
  Filled 2014-11-12: qty 5

## 2014-11-12 MED ORDER — METOPROLOL TARTRATE 25 MG PO TABS
37.5000 mg | ORAL_TABLET | Freq: Four times a day (QID) | ORAL | Status: DC
  Filled 2014-11-12 (×4): qty 1

## 2014-11-12 MED ORDER — METOPROLOL TARTRATE 25 MG PO TABS
25.0000 mg | ORAL_TABLET | Freq: Three times a day (TID) | ORAL | Status: DC
  Administered 2014-11-12 – 2014-11-13 (×5): 25 mg via ORAL
  Filled 2014-11-12 (×6): qty 1

## 2014-11-12 MED ORDER — METOPROLOL TARTRATE 1 MG/ML IV SOLN
5.0000 mg | Freq: Once | INTRAVENOUS | Status: AC
  Administered 2014-11-12: 5 mg via INTRAVENOUS
  Filled 2014-11-12: qty 5

## 2014-11-12 MED ORDER — AMIODARONE HCL IN DEXTROSE 360-4.14 MG/200ML-% IV SOLN
30.0000 mg/h | INTRAVENOUS | Status: DC
  Administered 2014-11-12: 30 mg/h via INTRAVENOUS
  Filled 2014-11-12 (×3): qty 200

## 2014-11-12 MED ORDER — METOPROLOL TARTRATE 12.5 MG HALF TABLET
12.5000 mg | ORAL_TABLET | Freq: Once | ORAL | Status: AC
  Administered 2014-11-12: 12.5 mg via ORAL
  Filled 2014-11-12: qty 1

## 2014-11-12 MED ORDER — POTASSIUM CHLORIDE CRYS ER 20 MEQ PO TBCR
40.0000 meq | EXTENDED_RELEASE_TABLET | Freq: Two times a day (BID) | ORAL | Status: AC
  Administered 2014-11-12 (×2): 40 meq via ORAL
  Filled 2014-11-12 (×2): qty 2

## 2014-11-12 MED ORDER — METOPROLOL TARTRATE 1 MG/ML IV SOLN
5.0000 mg | Freq: Once | INTRAVENOUS | Status: AC
  Administered 2014-11-12: 5 mg via INTRAVENOUS

## 2014-11-12 MED ORDER — METOPROLOL TARTRATE 25 MG PO TABS
25.0000 mg | ORAL_TABLET | Freq: Four times a day (QID) | ORAL | Status: DC
  Administered 2014-11-12: 25 mg via ORAL
  Filled 2014-11-12: qty 1

## 2014-11-12 NOTE — Progress Notes (Signed)
CARDIAC REHAB PHASE I   PRE:  Rate/Rhythm: 129-139 afib  BP:  Supine:   Sitting: 133/65  Standing:    SaO2: 97%RA  MODE:  Ambulation: 120 ft   POST:  Rate/Rhythm: 142 afib  BP:  Supine:   Sitting: 112/97  Standing:    SaO2: 96%RA 1350-1430 Pt walked 120 ft on RA with gait belt use, rolling walker and asst x 2.  Heart rate to 142. Tolerated well and cut walk short when she saw cardiologist as she wanted him to talk with family. Pt in good spirits. Did not get walker as far out as yesterday.   Graylon Good, RN BSN  11/12/2014 2:29 PM

## 2014-11-12 NOTE — Progress Notes (Signed)
Peggs to see pt to walk. Heart rate getting to 150 with getting to chair. To get amiodarone started now. Will continue to follow and walk when heart rate more stable. Graylon Good RN BSN 11/12/2014 11:47 AM

## 2014-11-12 NOTE — Progress Notes (Signed)
On call cardiology note  I was contacted by pt's RN that [name] [name] had gone into a-fib with RVR.  EKG demonstrates a-fib with RVR and lateral ST/T changes.  Patient notes palpitations but is otherwise asymptomatic.  Will give metoprolol 5 mg IV x 1 and increase metoprolol tartrate to 25 mg Q6 hours with further uptitration as BP allows.  Ultimately, may need to add amiodarone for rate control and to facilitate cardioversion.  Given cardiomyopathy, will avoid CCB for now.  Nelva Bush, MD Cardiology Fellow

## 2014-11-12 NOTE — Progress Notes (Signed)
   11/12/14 2331  Vitals  BP (!) 199/122 mmHg  BP Location Right Arm  BP Method Automatic  Patient Position (if appropriate) Lying  Pulse Rate 76  Pulse Rate Source Monitor  ECG Heart Rate 78  Cardiac Rhythm NSR  Resp (!) 30  Oxygen Therapy  SpO2 97 %  O2 Device Room Air  Pain Assessment  Pain Assessment No/denies pain  Pain Score 0  hydralazine 20  mg iv at this time, for elevated blood pressure

## 2014-11-12 NOTE — Progress Notes (Signed)
Speech Language Pathology Treatment: Dysphagia  Patient Details Name: [name] [name] MRN: [id number] DOB: [birth year] Today's Date: 11/12/2014 Time: 2840-6986 SLP Time Calculation (min) (ACUTE ONLY): 11 min  Assessment / Plan / Recommendation Clinical Impression  Follow-up for dysphagia. Pt was directly observed with solid and thin liquids consistencies; no s/s of aspiration. Recommend pt continue reg/thin liquid diet. Speech will sign-off.    HPI HPI: 69 y.o. year old female with significant past medical history of HTN, asthma, A-fib admitted with new onset atrial fibrillation and CAP. Per MD note pt also reports URI sxs over past 2-3 weeks. CXR 10/29/14. Repeat CXR revealed small right pleural effusion noted. Bibasilar airspace opacification may reflect pneumonia or possibly mildly asymmetric interstitial edema, right greater than left. This is mildly improved on the left and mildly worsened on the right since the prior study. Required emergent central venous catheter placement 3/21, intubated 3/21-3/25. Barium esophagram 02/14/12 revealed small hiatal hernia with moderate reflux, very mild distal esophageal narrowing.he pill does lodge here temporarily but does pass into the stomach intact.   Pertinent Vitals    SLP Plan  All goals met    Recommendations Diet recommendations: Regular;Thin liquid Liquids provided via: Cup;Straw Medication Administration: Whole meds with puree              Plan: All goals met    GO     Bermudez-Bosch, Noor Witte 11/12/2014, 9:57 AM

## 2014-11-12 NOTE — Progress Notes (Signed)
   11/12/14 0525  Vitals  BP (!) 188/137 mmHg  MAP (mmHg) 151  Pulse Rate (!) 27  ECG Heart Rate (!) 141  Resp (!) 21  Oxygen Therapy  SpO2 97 %  notified doc of HR and VS still up and got another order for IV lopressor 5mg .  Gave and will continue to monitor.

## 2014-11-12 NOTE — Progress Notes (Signed)
ANTICOAGULATION CONSULT NOTE - Follow Up Consult  Pharmacy Consult for heparin Indication: atrial fibrillation  Allergies  Allergen Reactions  . Bee Venom Anaphylaxis  . Shrimp [Shellfish Allergy] Swelling    Patient Measurements: Height: 5' 7.5" (171.5 cm) Weight: 213 lb 4.8 oz (96.752 kg) IBW/kg (Calculated) : 62.75 Heparin Dosing Weight: 85 kg  Vital Signs: Temp: 98.2 F (36.8 C) (03/30 0415) Temp Source: Oral (03/30 0415) BP: 123/109 mmHg (03/30 0820) Pulse Rate: 138 (03/30 0820)  Labs:  Recent Labs  11/10/14 0629 11/10/14 5520 11/11/14 0230 11/12/14 0431 11/12/14 0432  HGB  --   --  13.4 12.3  --   HCT  --   --  39.6 37.1  --   PLT  --   --  126* 122*  --   HEPARINUNFRC 0.42  --  0.36 0.40  --   CREATININE  --  0.97 1.04  --  1.13*    Estimated Creatinine Clearance: 56.7 mL/min (by C-G formula based on Cr of 1.13).   Medications:  Scheduled:  . amiodarone  150 mg Intravenous Once  . antiseptic oral rinse  7 mL Mouth Rinse BID  . aspirin EC  81 mg Oral Daily  . atorvastatin  80 mg Oral q1800  . cloNIDine  0.2 mg Transdermal Weekly  . docusate  100 mg Oral Daily  . feeding supplement (ENSURE ENLIVE)  237 mL Oral BID BM  . hydrochlorothiazide  12.5 mg Oral Daily  . lisinopril  40 mg Oral Daily  . metoprolol tartrate  25 mg Oral 3 times per day  . potassium chloride  40 mEq Oral BID    Assessment: 69 year old female on IV heparin (apixaban on hold) for atrial fibrillation. She is s/p cardiac arrest and s/p cath noted with multivessel CAD.  Heparin is at 1600 units/hr and noted at goal (HL= 0.4).  She went back into afib last pm and IV amiodarone added. Plans for CABG 4/1 or next week.   Goal of Therapy:  Heparin level 0.3-0.7 units/ml Monitor platelets by anticoagulation protocol: Yes   Plan:  -No heparin changes need -Daily heparin level and CBC  Hildred Laser, Pharm D 11/12/2014 10:39 AM

## 2014-11-12 NOTE — Progress Notes (Signed)
  Echocardiogram 2D Echocardiogram limited has been performed.  Diamond Nickel 11/12/2014, 10:56 AM

## 2014-11-12 NOTE — Progress Notes (Signed)
Subjective:  No CP/SOB. Day # 13 SCD/ CPR/ VDRF/ [place]/ Cath. Back into Afib with RVR last PM  Objective:  Temp:  [97.3 F (36.3 C)-98.3 F (36.8 C)] 98.2 F (36.8 C) (03/30 0415) Pulse Rate:  [27-138] 138 (03/30 0820) Resp:  [19-36] 19 (03/30 0820) BP: (123-213)/(69-137) 123/109 mmHg (03/30 0820) SpO2:  [95 %-99 %] 97 % (03/30 0820) Weight change:   Intake/Output from previous day: 03/29 0701 - 03/30 0700 In: 397 [P.O.:237; I.V.:160] Out: 1125 [Urine:1125]  Intake/Output from this shift:    Physical Exam: General appearance: alert and no distress Neck: no adenopathy, no carotid bruit, no JVD, supple, symmetrical, trachea midline and thyroid not enlarged, symmetric, no tenderness/mass/nodules Lungs: clear to auscultation bilaterally Heart: irregularly irregular rhythm Extremities: extremities normal, atraumatic, no cyanosis or edema  Lab Results: Results for orders placed or performed during the hospital encounter of 10/29/14 (from the past 48 hour(s))  Glucose, capillary     Status: None   Collection Time: 11/10/14  5:29 PM  Result Value Ref Range   Glucose-Capillary 98 70 - 99 mg/dL  Glucose, capillary     Status: Abnormal   Collection Time: 11/10/14  9:10 PM  Result Value Ref Range   Glucose-Capillary 118 (H) 70 - 99 mg/dL   Comment 1 Capillary Specimen   Heparin level (unfractionated)     Status: None   Collection Time: 11/11/14  2:30 AM  Result Value Ref Range   Heparin Unfractionated 0.36 0.30 - 0.70 IU/mL    Comment:        IF HEPARIN RESULTS ARE BELOW EXPECTED VALUES, AND PATIENT DOSAGE HAS BEEN CONFIRMED, SUGGEST FOLLOW UP TESTING OF ANTITHROMBIN III LEVELS.   Basic metabolic panel     Status: Abnormal   Collection Time: 11/11/14  2:30 AM  Result Value Ref Range   Sodium 142 135 - 145 mmol/L   Potassium 3.4 (L) 3.5 - 5.1 mmol/L   Chloride 110 96 - 112 mmol/L   CO2 20 19 - 32 mmol/L   Glucose, Bld 107 (H) 70 - 99 mg/dL   BUN 11 6 - 23  mg/dL   Creatinine, Ser 1.04 0.50 - 1.10 mg/dL   Calcium 9.3 8.4 - 10.5 mg/dL   GFR calc non Af Amer 54 (L) >90 mL/min   GFR calc Af Amer 62 (L) >90 mL/min    Comment: (NOTE) The eGFR has been calculated using the CKD EPI equation. This calculation has not been validated in all clinical situations. eGFR's persistently <90 mL/min signify possible Chronic Kidney Disease.    Anion gap 12 5 - 15  CBC     Status: Abnormal   Collection Time: 11/11/14  2:30 AM  Result Value Ref Range   WBC 12.1 (H) 4.0 - 10.5 K/uL   RBC 4.50 3.87 - 5.11 MIL/uL   Hemoglobin 13.4 12.0 - 15.0 g/dL   HCT 39.6 36.0 - 46.0 %   MCV 88.0 78.0 - 100.0 fL   MCH 29.8 26.0 - 34.0 pg   MCHC 33.8 30.0 - 36.0 g/dL   RDW 16.5 (H) 11.5 - 15.5 %   Platelets 126 (L) 150 - 400 K/uL    Comment: SPECIMEN CHECKED FOR CLOTS REPEATED TO VERIFY PLATELET COUNT CONFIRMED BY SMEAR   Glucose, capillary     Status: Abnormal   Collection Time: 11/11/14  7:41 AM  Result Value Ref Range   Glucose-Capillary 105 (H) 70 - 99 mg/dL   Comment 1 Capillary Specimen  Glucose, capillary     Status: Abnormal   Collection Time: 11/11/14 12:02 PM  Result Value Ref Range   Glucose-Capillary 108 (H) 70 - 99 mg/dL   Comment 1 Capillary Specimen   Glucose, capillary     Status: Abnormal   Collection Time: 11/11/14  4:51 PM  Result Value Ref Range   Glucose-Capillary 115 (H) 70 - 99 mg/dL   Comment 1 Capillary Specimen   Glucose, capillary     Status: Abnormal   Collection Time: 11/11/14  9:44 PM  Result Value Ref Range   Glucose-Capillary 117 (H) 70 - 99 mg/dL   Comment 1 Capillary Specimen   Urinalysis, Routine w reflex microscopic     Status: None   Collection Time: 11/12/14  4:19 AM  Result Value Ref Range   Color, Urine YELLOW YELLOW   APPearance CLEAR CLEAR   Specific Gravity, Urine 1.015 1.005 - 1.030   pH 5.5 5.0 - 8.0   Glucose, UA NEGATIVE NEGATIVE mg/dL   Hgb urine dipstick NEGATIVE NEGATIVE   Bilirubin Urine NEGATIVE  NEGATIVE   Ketones, ur NEGATIVE NEGATIVE mg/dL   Protein, ur NEGATIVE NEGATIVE mg/dL   Urobilinogen, UA 1.0 0.0 - 1.0 mg/dL   Nitrite NEGATIVE NEGATIVE   Leukocytes, UA NEGATIVE NEGATIVE    Comment: MICROSCOPIC NOT DONE ON URINES WITH NEGATIVE PROTEIN, BLOOD, LEUKOCYTES, NITRITE, OR GLUCOSE <1000 mg/dL.  Heparin level (unfractionated)     Status: None   Collection Time: 11/12/14  4:31 AM  Result Value Ref Range   Heparin Unfractionated 0.40 0.30 - 0.70 IU/mL    Comment:        IF HEPARIN RESULTS ARE BELOW EXPECTED VALUES, AND PATIENT DOSAGE HAS BEEN CONFIRMED, SUGGEST FOLLOW UP TESTING OF ANTITHROMBIN III LEVELS.   CBC     Status: Abnormal   Collection Time: 11/12/14  4:31 AM  Result Value Ref Range   WBC 11.2 (H) 4.0 - 10.5 K/uL   RBC 4.19 3.87 - 5.11 MIL/uL   Hemoglobin 12.3 12.0 - 15.0 g/dL   HCT 37.1 36.0 - 46.0 %   MCV 88.5 78.0 - 100.0 fL   MCH 29.4 26.0 - 34.0 pg   MCHC 33.2 30.0 - 36.0 g/dL   RDW 16.7 (H) 11.5 - 15.5 %   Platelets 122 (L) 150 - 400 K/uL  Comprehensive metabolic panel     Status: Abnormal   Collection Time: 11/12/14  4:32 AM  Result Value Ref Range   Sodium 142 135 - 145 mmol/L   Potassium 3.5 3.5 - 5.1 mmol/L   Chloride 111 96 - 112 mmol/L   CO2 23 19 - 32 mmol/L   Glucose, Bld 108 (H) 70 - 99 mg/dL   BUN 11 6 - 23 mg/dL   Creatinine, Ser 1.13 (H) 0.50 - 1.10 mg/dL   Calcium 9.2 8.4 - 10.5 mg/dL   Total Protein 6.1 6.0 - 8.3 g/dL   Albumin 3.0 (L) 3.5 - 5.2 g/dL   AST 84 (H) 0 - 37 U/L   ALT 66 (H) 0 - 35 U/L   Alkaline Phosphatase 124 (H) 39 - 117 U/L   Total Bilirubin 0.5 0.3 - 1.2 mg/dL   GFR calc non Af Amer 48 (L) >90 mL/min   GFR calc Af Amer 56 (L) >90 mL/min    Comment: (NOTE) The eGFR has been calculated using the CKD EPI equation. This calculation has not been validated in all clinical situations. eGFR's persistently <90 mL/min signify possible Chronic Kidney Disease.  Anion gap 8 5 - 15  Lipid panel     Status: Abnormal    Collection Time: 11/12/14  4:32 AM  Result Value Ref Range   Cholesterol 116 0 - 200 mg/dL   Triglycerides 111 <150 mg/dL   HDL 34 (L) >39 mg/dL   Total CHOL/HDL Ratio 3.4 RATIO   VLDL 22 0 - 40 mg/dL   LDL Cholesterol 60 0 - 99 mg/dL    Comment:        Total Cholesterol/HDL:CHD Risk Coronary Heart Disease Risk Table                     Men   Women  1/2 Average Risk   3.4   3.3  Average Risk       5.0   4.4  2 X Average Risk   9.6   7.1  3 X Average Risk  23.4   11.0        Use the calculated Patient Ratio above and the CHD Risk Table to determine the patient's CHD Risk.        ATP III CLASSIFICATION (LDL):  <100     mg/dL   Optimal  100-129  mg/dL   Near or Above                    Optimal  130-159  mg/dL   Borderline  160-189  mg/dL   High  >190     mg/dL   Very High   Blood gas, arterial     Status: Abnormal   Collection Time: 11/12/14  6:30 AM  Result Value Ref Range   FIO2 0.28 %   O2 Content 2.0 L/min   Delivery systems NASAL CANNULA    pH, Arterial 7.514 (H) 7.350 - 7.450   pCO2 arterial 25.8 (L) 35.0 - 45.0 mmHg   pO2, Arterial 91.1 80.0 - 100.0 mmHg   Bicarbonate 20.6 20.0 - 24.0 mEq/L   TCO2 21.4 0 - 100 mmol/L   Acid-base deficit 2.0 0.0 - 2.0 mmol/L   O2 Saturation 97.1 %   Patient temperature 98.6    Collection site RIGHT RADIAL    Drawn by [id number]    Sample type ARTERIAL    Allens test (pass/fail) PASS PASS  Glucose, capillary     Status: Abnormal   Collection Time: 11/12/14  8:16 AM  Result Value Ref Range   Glucose-Capillary 110 (H) 70 - 99 mg/dL   Comment 1 Capillary Specimen     Imaging: Imaging results have been reviewed  Tele- Afib with RVR  EKG- Afib with VR 130, lat ST depression ( I personally reviewed this EKG)  Assessment/Plan:   1. Principal Problem: 2.   Cardiac arrest 3. Active Problems: 4.   HTN (hypertension) 5.   Atrial fibrillation 6.   CAP (community acquired pneumonia) 38.   Cardiomyopathy 8.   Hypokalemia 9.    Acute on chronic combined systolic and diastolic HF (heart failure) 10.   NSTEMI (non-ST elevated myocardial infarction) 11.   SOB (shortness of breath) 12.   Atrial fibrillation with RVR 13.   PEA (Pulseless electrical activity) 14.   Acute respiratory failure with hypoxemia 15.   Anoxic brain injury 16.   Essential hypertension 17.   Cardiomyopathy, ischemic 18.   Coronary artery disease due to lipid rich plaque 19.   Accelerated hypertension 20.   Time Spent Directly with Patient:  20 minutes  Length of Stay:  LOS:  13 days   1. SCD- Day # 13 SCD/ CPR/ VDRF/ Cath/ TTVPM. She has made remarkable recovery. Cath showed LM/2VD and 2D showed moderately severe LVD (EF 35%). No evidence of CHF. Dr. Roxy Manns saw again yesterday and is tentatively planning to perform CABG 4/1. Results of follow up 2D pending. On approp meds. Labs OK (replete K). BB titrated.  2. PAF- Pt went back into Afib with RVR  Last PM. BB was titrated. On IV hep. Will add amio IV bolus & infusion. I suspect that she will convert pharmacologically,   3. Hypokalemia- K 3.5 ---> replete. Mg 2.0.   Lorretta Harp 11/12/2014, 10:29 AM

## 2014-11-12 NOTE — Progress Notes (Signed)
Patient has been up and ambulatory this afternoon with cardiac rehab, she moves freely in bed and to chair at bedside, no complaints of pain or disocmfort, no shortness of breath.  She is alert and oriented, family members have been at bedside all day.  Converted to NSR from A fib approximately 1500.

## 2014-11-12 NOTE — Progress Notes (Signed)
   11/12/14 0625  Vitals  Pulse Rate (!) 132  ECG Heart Rate (!) 141  Resp (!) 24  Oxygen Therapy  SpO2 98 %  DR came back by to check on pt and ordered an additional po dose of lopressor 12.5mg .  Given to pt and pt continues to be monitored by the undersigned.

## 2014-11-12 NOTE — Progress Notes (Signed)
   11/12/14 0355  Vitals  BP (!) 213/86 mmHg  MAP (mmHg) 131  ECG Heart Rate 74  Resp (!) 24  rechecked BP and am gave hydralazine 40mg  prn.  Will recheck bp and continue to monitor pt.

## 2014-11-12 NOTE — Progress Notes (Signed)
   11/12/14 0450  Vitals  BP (!) 177/98 mmHg  MAP (mmHg) 118  Pulse Rate (!) 56  ECG Heart Rate (!) 149  Resp (!) 29  Oxygen Therapy  SpO2 98 %  Dr End notified around 04:35 that pt had gone back into Afib with RVR confirmed with a EKG obtained at the time.  Dr phoned back and he said he would come to see pt.  Dr arrived and ordered 5mg  IV lopressor and 25mg  of po lopressor.  meds given will continue to monitor.

## 2014-11-13 ENCOUNTER — Encounter (HOSPITAL_COMMUNITY): Payer: Self-pay | Admitting: Certified Registered Nurse Anesthetist

## 2014-11-13 DIAGNOSIS — I4891 Unspecified atrial fibrillation: Secondary | ICD-10-CM

## 2014-11-13 DIAGNOSIS — I25110 Atherosclerotic heart disease of native coronary artery with unstable angina pectoris: Secondary | ICD-10-CM

## 2014-11-13 LAB — BASIC METABOLIC PANEL
Anion gap: 13 (ref 5–15)
BUN: 10 mg/dL (ref 6–23)
CO2: 18 mmol/L — ABNORMAL LOW (ref 19–32)
Calcium: 9.4 mg/dL (ref 8.4–10.5)
Chloride: 110 mmol/L (ref 96–112)
Creatinine, Ser: 1.11 mg/dL — ABNORMAL HIGH (ref 0.50–1.10)
GFR calc Af Amer: 57 mL/min — ABNORMAL LOW (ref >90)
GFR calc non Af Amer: 49 mL/min — ABNORMAL LOW (ref >90)
Glucose, Bld: 114 mg/dL — ABNORMAL HIGH (ref 70–99)
Potassium: 4.2 mmol/L (ref 3.5–5.1)
Sodium: 141 mmol/L (ref 135–145)

## 2014-11-13 LAB — CBC
HCT: 38.7 % (ref 36.0–46.0)
Hemoglobin: 12.9 g/dL (ref 12.0–15.0)
MCH: 29.5 pg (ref 26.0–34.0)
MCHC: 33.3 g/dL (ref 30.0–36.0)
MCV: 88.4 fL (ref 78.0–100.0)
Platelets: 107 10*3/uL — ABNORMAL LOW (ref 150–400)
RBC: 4.38 MIL/uL (ref 3.87–5.11)
RDW: 16.8 % — ABNORMAL HIGH (ref 11.5–15.5)
WBC: 11.6 10*3/uL — ABNORMAL HIGH (ref 4.0–10.5)

## 2014-11-13 LAB — PREALBUMIN: Prealbumin: 16 mg/dL — ABNORMAL LOW (ref 18.0–45.0)

## 2014-11-13 LAB — TYPE AND SCREEN
ABO/RH(D): A POS
Antibody Screen: NEGATIVE

## 2014-11-13 LAB — HEMOGLOBIN A1C
Hgb A1c MFr Bld: 5.9 % — ABNORMAL HIGH (ref 4.8–5.6)
Mean Plasma Glucose: 123 mg/dL

## 2014-11-13 LAB — ABO/RH: ABO/RH(D): A POS

## 2014-11-13 LAB — HEPARIN LEVEL (UNFRACTIONATED): Heparin Unfractionated: 0.58 IU/mL (ref 0.30–0.70)

## 2014-11-13 LAB — GLUCOSE, CAPILLARY: Glucose-Capillary: 134 mg/dL — ABNORMAL HIGH (ref 70–99)

## 2014-11-13 MED ORDER — AMIODARONE HCL 200 MG PO TABS
400.0000 mg | ORAL_TABLET | Freq: Two times a day (BID) | ORAL | Status: DC
  Administered 2014-11-13 (×2): 400 mg via ORAL
  Filled 2014-11-13 (×4): qty 2

## 2014-11-13 MED ORDER — DEXTROSE 5 % IV SOLN
750.0000 mg | INTRAVENOUS | Status: DC
  Filled 2014-11-13: qty 750

## 2014-11-13 MED ORDER — DEXTROSE 5 % IV SOLN
30.0000 ug/min | INTRAVENOUS | Status: DC
  Filled 2014-11-13: qty 2

## 2014-11-13 MED ORDER — VANCOMYCIN HCL 10 G IV SOLR
1500.0000 mg | INTRAVENOUS | Status: AC
  Administered 2014-11-14: 1500 mg via INTRAVENOUS
  Filled 2014-11-13: qty 1500

## 2014-11-13 MED ORDER — DEXMEDETOMIDINE HCL IN NACL 400 MCG/100ML IV SOLN
0.1000 ug/kg/h | INTRAVENOUS | Status: DC
  Filled 2014-11-13: qty 100

## 2014-11-13 MED ORDER — BISACODYL 5 MG PO TBEC
5.0000 mg | DELAYED_RELEASE_TABLET | Freq: Once | ORAL | Status: AC
  Administered 2014-11-13: 5 mg via ORAL
  Filled 2014-11-13: qty 1

## 2014-11-13 MED ORDER — SODIUM CHLORIDE 0.9 % IV SOLN
INTRAVENOUS | Status: DC
  Filled 2014-11-13: qty 2.5

## 2014-11-13 MED ORDER — EPINEPHRINE HCL 1 MG/ML IJ SOLN
0.0000 ug/min | INTRAVENOUS | Status: DC
  Filled 2014-11-13: qty 4

## 2014-11-13 MED ORDER — METOPROLOL TARTRATE 12.5 MG HALF TABLET
12.5000 mg | ORAL_TABLET | Freq: Once | ORAL | Status: AC
  Administered 2014-11-14: 12.5 mg via ORAL
  Filled 2014-11-13: qty 1

## 2014-11-13 MED ORDER — CHLORHEXIDINE GLUCONATE 4 % EX LIQD
60.0000 mL | Freq: Once | CUTANEOUS | Status: AC
  Administered 2014-11-13: 4 via TOPICAL
  Filled 2014-11-13: qty 60

## 2014-11-13 MED ORDER — DEXTROSE 5 % IV SOLN
1.5000 g | INTRAVENOUS | Status: AC
  Administered 2014-11-14: .75 g via INTRAVENOUS
  Administered 2014-11-14: 1.5 g via INTRAVENOUS
  Filled 2014-11-13: qty 1.5

## 2014-11-13 MED ORDER — DOCUSATE SODIUM 100 MG PO CAPS
100.0000 mg | ORAL_CAPSULE | Freq: Two times a day (BID) | ORAL | Status: DC
  Administered 2014-11-13 (×2): 100 mg via ORAL
  Filled 2014-11-13 (×4): qty 1

## 2014-11-13 MED ORDER — PLASMA-LYTE 148 IV SOLN
INTRAVENOUS | Status: AC
  Administered 2014-11-14: 400 mL
  Filled 2014-11-13: qty 2.5

## 2014-11-13 MED ORDER — DOPAMINE-DEXTROSE 3.2-5 MG/ML-% IV SOLN
0.0000 ug/kg/min | INTRAVENOUS | Status: DC
  Filled 2014-11-13: qty 250

## 2014-11-13 MED ORDER — POTASSIUM CHLORIDE 2 MEQ/ML IV SOLN
80.0000 meq | INTRAVENOUS | Status: DC
  Filled 2014-11-13: qty 40

## 2014-11-13 MED ORDER — TEMAZEPAM 15 MG PO CAPS
15.0000 mg | ORAL_CAPSULE | Freq: Once | ORAL | Status: AC | PRN
  Administered 2014-11-14: 15 mg via ORAL
  Filled 2014-11-13: qty 1

## 2014-11-13 MED ORDER — MAGNESIUM SULFATE 50 % IJ SOLN
40.0000 meq | INTRAMUSCULAR | Status: DC
  Filled 2014-11-13: qty 10

## 2014-11-13 MED ORDER — SODIUM CHLORIDE 0.9 % IV SOLN
INTRAVENOUS | Status: DC
  Filled 2014-11-13: qty 30

## 2014-11-13 MED ORDER — VANCOMYCIN HCL 1000 MG IV SOLR
INTRAVENOUS | Status: AC
  Administered 2014-11-14: 1000 mL
  Filled 2014-11-13: qty 1000

## 2014-11-13 MED ORDER — CHLORHEXIDINE GLUCONATE 4 % EX LIQD
60.0000 mL | Freq: Once | CUTANEOUS | Status: AC
  Administered 2014-11-14: 4 via TOPICAL
  Filled 2014-11-13: qty 60

## 2014-11-13 MED ORDER — NITROGLYCERIN IN D5W 200-5 MCG/ML-% IV SOLN
2.0000 ug/min | INTRAVENOUS | Status: DC
  Filled 2014-11-13: qty 250

## 2014-11-13 MED ORDER — AMINOCAPROIC ACID 250 MG/ML IV SOLN
INTRAVENOUS | Status: DC
  Filled 2014-11-13: qty 40

## 2014-11-13 NOTE — Progress Notes (Signed)
Brought in IS and IS education form for patient as part of her pre-op preparation. Pt cut me off as I started to explain what the IS was for and stated, " I want to wait to talk about that later". IS and education form in pt's room; will try again later.

## 2014-11-13 NOTE — Progress Notes (Signed)
Subjective:  Feels much better. Converted to NSR on IV amio at 1500 yesterday. For CABG tomorrow with Dr. Roxy Manns  Objective:  Temp:  [97.7 F (36.5 C)-98.7 F (37.1 C)] 97.7 F (36.5 C) (03/31 0827) Pulse Rate:  [69-134] 76 (03/30 2331) Resp:  [20-32] 24 (03/31 0827) BP: (133-199)/(55-122) 169/86 mmHg (03/31 0827) SpO2:  [96 %-98 %] 96 % (03/31 0827) Weight:  [228 lb 11.2 oz (103.738 kg)] 228 lb 11.2 oz (103.738 kg) (03/31 0408) Weight change:   Intake/Output from previous day: 03/30 0701 - 03/31 0700 In: 1433.6 [P.O.:390; I.V.:1043.6] Out: 650 [Urine:650]  Intake/Output from this shift:    Physical Exam: General appearance: alert and no distress Neck: no adenopathy, no carotid bruit, no JVD, supple, symmetrical, trachea midline and thyroid not enlarged, symmetric, no tenderness/mass/nodules Lungs: clear to auscultation bilaterally Heart: regular rate and rhythm, S1, S2 normal, no murmur, click, rub or gallop Extremities: extremities normal, atraumatic, no cyanosis or edema  Lab Results: Results for orders placed or performed during the hospital encounter of 10/29/14 (from the past 48 hour(s))  Glucose, capillary     Status: Abnormal   Collection Time: 11/11/14 12:02 PM  Result Value Ref Range   Glucose-Capillary 108 (H) 70 - 99 mg/dL   Comment 1 Capillary Specimen   Glucose, capillary     Status: Abnormal   Collection Time: 11/11/14  4:51 PM  Result Value Ref Range   Glucose-Capillary 115 (H) 70 - 99 mg/dL   Comment 1 Capillary Specimen   Glucose, capillary     Status: Abnormal   Collection Time: 11/11/14  9:44 PM  Result Value Ref Range   Glucose-Capillary 117 (H) 70 - 99 mg/dL   Comment 1 Capillary Specimen   Urinalysis, Routine w reflex microscopic     Status: None   Collection Time: 11/12/14  4:19 AM  Result Value Ref Range   Color, Urine YELLOW YELLOW   APPearance CLEAR CLEAR   Specific Gravity, Urine 1.015 1.005 - 1.030   pH 5.5 5.0 - 8.0   Glucose,  UA NEGATIVE NEGATIVE mg/dL   Hgb urine dipstick NEGATIVE NEGATIVE   Bilirubin Urine NEGATIVE NEGATIVE   Ketones, ur NEGATIVE NEGATIVE mg/dL   Protein, ur NEGATIVE NEGATIVE mg/dL   Urobilinogen, UA 1.0 0.0 - 1.0 mg/dL   Nitrite NEGATIVE NEGATIVE   Leukocytes, UA NEGATIVE NEGATIVE    Comment: MICROSCOPIC NOT DONE ON URINES WITH NEGATIVE PROTEIN, BLOOD, LEUKOCYTES, NITRITE, OR GLUCOSE <1000 mg/dL.  Heparin level (unfractionated)     Status: None   Collection Time: 11/12/14  4:31 AM  Result Value Ref Range   Heparin Unfractionated 0.40 0.30 - 0.70 IU/mL    Comment:        IF HEPARIN RESULTS ARE BELOW EXPECTED VALUES, AND PATIENT DOSAGE HAS BEEN CONFIRMED, SUGGEST FOLLOW UP TESTING OF ANTITHROMBIN III LEVELS.   CBC     Status: Abnormal   Collection Time: 11/12/14  4:31 AM  Result Value Ref Range   WBC 11.2 (H) 4.0 - 10.5 K/uL   RBC 4.19 3.87 - 5.11 MIL/uL   Hemoglobin 12.3 12.0 - 15.0 g/dL   HCT 37.1 36.0 - 46.0 %   MCV 88.5 78.0 - 100.0 fL   MCH 29.4 26.0 - 34.0 pg   MCHC 33.2 30.0 - 36.0 g/dL   RDW 16.7 (H) 11.5 - 15.5 %   Platelets 122 (L) 150 - 400 K/uL  Comprehensive metabolic panel     Status: Abnormal   Collection  Time: 11/12/14  4:32 AM  Result Value Ref Range   Sodium 142 135 - 145 mmol/L   Potassium 3.5 3.5 - 5.1 mmol/L   Chloride 111 96 - 112 mmol/L   CO2 23 19 - 32 mmol/L   Glucose, Bld 108 (H) 70 - 99 mg/dL   BUN 11 6 - 23 mg/dL   Creatinine, Ser 1.13 (H) 0.50 - 1.10 mg/dL   Calcium 9.2 8.4 - 10.5 mg/dL   Total Protein 6.1 6.0 - 8.3 g/dL   Albumin 3.0 (L) 3.5 - 5.2 g/dL   AST 84 (H) 0 - 37 U/L   ALT 66 (H) 0 - 35 U/L   Alkaline Phosphatase 124 (H) 39 - 117 U/L   Total Bilirubin 0.5 0.3 - 1.2 mg/dL   GFR calc non Af Amer 48 (L) >90 mL/min   GFR calc Af Amer 56 (L) >90 mL/min    Comment: (NOTE) The eGFR has been calculated using the CKD EPI equation. This calculation has not been validated in all clinical situations. eGFR's persistently <90 mL/min signify  possible Chronic Kidney Disease.    Anion gap 8 5 - 15  Hemoglobin A1c     Status: Abnormal   Collection Time: 11/12/14  4:32 AM  Result Value Ref Range   Hgb A1c MFr Bld 5.9 (H) 4.8 - 5.6 %    Comment: (NOTE)         Pre-diabetes: 5.7 - 6.4         Diabetes: >6.4         Glycemic control for adults with diabetes: <7.0    Mean Plasma Glucose 123 mg/dL    Comment: (NOTE) Performed At: [facility] [place], [place] [id number] Lindon Romp MD UR:[id number]   Lipid panel     Status: Abnormal   Collection Time: 11/12/14  4:32 AM  Result Value Ref Range   Cholesterol 116 0 - 200 mg/dL   Triglycerides 111 <150 mg/dL   HDL 34 (L) >39 mg/dL   Total CHOL/HDL Ratio 3.4 RATIO   VLDL 22 0 - 40 mg/dL   LDL Cholesterol 60 0 - 99 mg/dL    Comment:        Total Cholesterol/HDL:CHD Risk Coronary Heart Disease Risk Table                     Men   Women  1/2 Average Risk   3.4   3.3  Average Risk       5.0   4.4  2 X Average Risk   9.6   7.1  3 X Average Risk  23.4   11.0        Use the calculated Patient Ratio above and the CHD Risk Table to determine the patient's CHD Risk.        ATP III CLASSIFICATION (LDL):  <100     mg/dL   Optimal  100-129  mg/dL   Near or Above                    Optimal  130-159  mg/dL   Borderline  160-189  mg/dL   High  >190     mg/dL   Very High   Blood gas, arterial     Status: Abnormal   Collection Time: 11/12/14  6:30 AM  Result Value Ref Range   FIO2 0.28 %   O2 Content 2.0 L/min   Delivery systems NASAL CANNULA  pH, Arterial 7.514 (H) 7.350 - 7.450   pCO2 arterial 25.8 (L) 35.0 - 45.0 mmHg   pO2, Arterial 91.1 80.0 - 100.0 mmHg   Bicarbonate 20.6 20.0 - 24.0 mEq/L   TCO2 21.4 0 - 100 mmol/L   Acid-base deficit 2.0 0.0 - 2.0 mmol/L   O2 Saturation 97.1 %   Patient temperature 98.6    Collection site RIGHT RADIAL    Drawn by [id number]    Sample type ARTERIAL    Allens test (pass/fail) PASS PASS  Glucose,  capillary     Status: Abnormal   Collection Time: 11/12/14  8:16 AM  Result Value Ref Range   Glucose-Capillary 110 (H) 70 - 99 mg/dL   Comment 1 Capillary Specimen   Glucose, capillary     Status: None   Collection Time: 11/12/14  9:08 PM  Result Value Ref Range   Glucose-Capillary 92 70 - 99 mg/dL   Comment 1 Capillary Specimen   CBC     Status: Abnormal   Collection Time: 11/13/14  2:34 AM  Result Value Ref Range   WBC 11.6 (H) 4.0 - 10.5 K/uL   RBC 4.38 3.87 - 5.11 MIL/uL   Hemoglobin 12.9 12.0 - 15.0 g/dL   HCT 38.7 36.0 - 46.0 %   MCV 88.4 78.0 - 100.0 fL   MCH 29.5 26.0 - 34.0 pg   MCHC 33.3 30.0 - 36.0 g/dL   RDW 16.8 (H) 11.5 - 15.5 %   Platelets 107 (L) 150 - 400 K/uL    Comment: CONSISTENT WITH PREVIOUS RESULT  Basic metabolic panel     Status: Abnormal   Collection Time: 11/13/14  2:34 AM  Result Value Ref Range   Sodium 141 135 - 145 mmol/L   Potassium 4.2 3.5 - 5.1 mmol/L   Chloride 110 96 - 112 mmol/L   CO2 18 (L) 19 - 32 mmol/L   Glucose, Bld 114 (H) 70 - 99 mg/dL   BUN 10 6 - 23 mg/dL   Creatinine, Ser 1.11 (H) 0.50 - 1.10 mg/dL   Calcium 9.4 8.4 - 10.5 mg/dL   GFR calc non Af Amer 49 (L) >90 mL/min   GFR calc Af Amer 57 (L) >90 mL/min    Comment: (NOTE) The eGFR has been calculated using the CKD EPI equation. This calculation has not been validated in all clinical situations. eGFR's persistently <90 mL/min signify possible Chronic Kidney Disease.    Anion gap 13 5 - 15  Heparin level (unfractionated)     Status: None   Collection Time: 11/13/14  2:40 AM  Result Value Ref Range   Heparin Unfractionated 0.58 0.30 - 0.70 IU/mL    Comment:        IF HEPARIN RESULTS ARE BELOW EXPECTED VALUES, AND PATIENT DOSAGE HAS BEEN CONFIRMED, SUGGEST FOLLOW UP TESTING OF ANTITHROMBIN III LEVELS.   Glucose, capillary     Status: Abnormal   Collection Time: 11/13/14  8:26 AM  Result Value Ref Range   Glucose-Capillary 134 (H) 70 - 99 mg/dL   Comment 1  Capillary Specimen     Imaging: Imaging results have been reviewed  Tele- NSR 60s  Assessment/Plan:   1. Principal Problem: 2.   Cardiac arrest 3. Active Problems: 4.   HTN (hypertension) 5.   Atrial fibrillation 6.   CAP (community acquired pneumonia) 66.   Cardiomyopathy 8.   Hypokalemia 9.   Acute on chronic combined systolic and diastolic HF (heart failure) 10.   NSTEMI (non-ST  elevated myocardial infarction) 11.   SOB (shortness of breath) 12.   Atrial fibrillation with RVR 13.   PEA (Pulseless electrical activity) 14.   Acute respiratory failure with hypoxemia 15.   Anoxic brain injury 16.   Essential hypertension 17.   Cardiomyopathy, ischemic 18.   Coronary artery disease due to lipid rich plaque 19.   Accelerated hypertension 20.   Time Spent Directly with Patient:  15 minutes  Length of Stay:  LOS: 14 days   Pt converted to NSR yesterday on IV amio. Feels better. 2D yesterday showed improvement in LV Fxn (EF 45%) with mild MR. K improved with repletion. Will change amio to PO 400 mg BID, D/C IJ line. For CABG tomorrow with Dr. Roxy Manns.  [name] [name] 11/13/2014, 9:21 AM

## 2014-11-13 NOTE — Progress Notes (Signed)
Patient has been much more anxious today due to impending surgery, she currently has family members at bedside that appear to keep her much calmer, consents completed and signed, cardiac rehab did education with her and daughter pam and video watched together.  Central line removed from [facility] without difficulty, PIV started by IV team.  Reviewed orders for surgery tmrw. Will continue to monitor

## 2014-11-13 NOTE — Progress Notes (Addendum)
[address]       [place] [address]             [phone]     CARDIOTHORACIC SURGERY PROGRESS NOTE  9 Days Post-Op  S/P Procedure(s) (LRB): LEFT HEART CATHETERIZATION WITH CORONARY ANGIOGRAM (N/A) TEMPORARY PACEMAKER INSERTION  Subjective: Patient remains stable.  No chest pain.  Objective: Vital signs in last 24 hours: Temp:  [97.7 F (36.5 C)-98.7 F (37.1 C)] 98.4 F (36.9 C) (03/31 1204) Pulse Rate:  [69-76] 76 (03/30 2331) Cardiac Rhythm:  [-] Normal sinus rhythm (03/31 1204) Resp:  [20-32] 22 (03/31 1204) BP: (140-199)/(55-122) 172/99 mmHg (03/31 1204) SpO2:  [94 %-98 %] 94 % (03/31 1204) Weight:  [103.738 kg (228 lb 11.2 oz)] 103.738 kg (228 lb 11.2 oz) (03/31 0408)  Physical Exam:  Rhythm:   sinus  Breath sounds: clear  Heart sounds:  RRR w/out murmur  Incisions:  n/a  Abdomen:  soft  Extremities:  warm   Intake/Output from previous day: 03/30 0701 - 03/31 0700 In: 1433.6 [P.O.:390; I.V.:1043.6] Out: 650 [Urine:650] Intake/Output this shift: Total I/O In: 125.9 [I.V.:125.9] Out: -   Lab Results:  Recent Labs  11/12/14 0431 11/13/14 0234  WBC 11.2* 11.6*  HGB 12.3 12.9  HCT 37.1 38.7  PLT 122* 107*   BMET:  Recent Labs  11/12/14 0432 11/13/14 0234  NA 142 141  K 3.5 4.2  CL 111 110  CO2 23 18*  GLUCOSE 108* 114*  BUN 11 10  CREATININE 1.13* 1.11*  CALCIUM 9.2 9.4    CBG (last 3)   Recent Labs  11/12/14 0816 11/12/14 2108 11/13/14 0826  GLUCAP 110* 92 134*   PT/INR:  No results for input(s): LABPROT, INR in the last 72 hours.  CXR:  PORTABLE CHEST - 1 VIEW  COMPARISON: Portable chest x-ray of November 08, 2014  FINDINGS: The lungs are well-expanded. The pulmonary interstitial markings are mildly increased but not as conspicuous as on the previous study. The cardiac silhouette is enlarged. The pulmonary vascularity is mildly engorged. The right internal jugular venous catheter tip projects over  the midportion of the SVC. There is no pleural effusion.  IMPRESSION: Low-grade CHF with mild interstitial edema not as significant as on the previous study. There is no alveolar pneumonia.   Electronically Signed  By: David Martinique  On: 11/12/2014 07:35    Transthoracic Echocardiography  Patient:  [name] [name], [name] [name] MR #:    [id number] Study Date: 11/12/2014 Gender:   F Age:    69 Height:   170.2 cm Weight:   96 kg BSA:    2.16 m^2 Pt. Status: Room:    [place], MD [place], MD SONOGRAPHER Diamond Nickel ADMITTING  Deneise Lever ATTENDING  Lyman Bishop MD PERFORMING  [facility], Inpatient  cc:  ------------------------------------------------------------------- LV EF: 45%  ------------------------------------------------------------------- Indications:   Cardiac arrest 427.5.  ------------------------------------------------------------------- History:  PMH: Assess LV function.  ------------------------------------------------------------------- Study Conclusions  - Left ventricle: The cavity size was normal. Wall thickness was increased in a pattern of moderate LVH. The estimated ejection fraction was 45%. Diffuse hypokinesis. Indeterminant diastolic function (atrial fibrillation). - Aortic valve: There was no stenosis. There was trivial regurgitation. - Mitral valve: Mildly calcified annulus. Mildly calcified leaflets . There was mild regurgitation. - Left atrium: The atrium was severely dilated. - Right ventricle: The cavity size was normal. Systolic function was mildly reduced. - Right atrium:  The atrium was mildly dilated. - Pulmonary arteries: No complete TR doppler jet so unable to estimate PA systolic pressure. - Systemic veins: IVC not visualized.  Impressions:  - The patient was in rapid atrial fibrillation during the study. Difficult to  quantify EF with HR 130s-150s during this study. Estimate 45% mildly hypokinetic but would repeat limited study for LV function with patient&'s HR is slower. Normal RV size with mildly decreased systolic function. Mild MR.  Transthoracic echocardiography. M-mode, limited 2D, limited spectral Doppler, and color Doppler. Birthdate: Patient birthdate: [birth year]. Age: Patient is 69 yr old. Sex: Gender: female.  BMI: 33.2 kg/m^2. Blood pressure:   171/97 Patient status: Inpatient. Study date: Study date: 11/12/2014. Study time: 10:34 AM. Location: ICU/CCU  -------------------------------------------------------------------  ------------------------------------------------------------------- Left ventricle: The cavity size was normal. Wall thickness was increased in a pattern of moderate LVH. The estimated ejection fraction was 45%. Diffuse hypokinesis. Indeterminant diastolic function (atrial fibrillation).  ------------------------------------------------------------------- Aortic valve:  Trileaflet; mildly calcified leaflets. Doppler: There was no stenosis.  There was trivial regurgitation.  ------------------------------------------------------------------- Aorta: Aortic root: The aortic root was normal in size. Ascending aorta: The ascending aorta was normal in size.  ------------------------------------------------------------------- Mitral valve:  Mildly calcified annulus. Mildly calcified leaflets . Doppler:  There was no evidence for stenosis.  There was mild regurgitation.  ------------------------------------------------------------------- Left atrium: The atrium was severely dilated.  ------------------------------------------------------------------- Right ventricle: The cavity size was normal. Systolic function was mildly reduced.  ------------------------------------------------------------------- Pulmonic valve:  Poorly  visualized.  ------------------------------------------------------------------- Tricuspid valve:  Doppler: There was no significant regurgitation.  ------------------------------------------------------------------- Pulmonary artery:  No complete TR doppler jet so unable to estimate PA systolic pressure.  ------------------------------------------------------------------- Right atrium: The atrium was mildly dilated.  ------------------------------------------------------------------- Pericardium: There was no pericardial effusion.  ------------------------------------------------------------------- Systemic veins: IVC not visualized.  ------------------------------------------------------------------- Measurements  Left ventricle              Value    Reference LV ID, ED, PLAX chordal    (H)    52.1 mm   43 - 52 LV ID, ES, PLAX chordal    (H)    41.5 mm   23 - 38 LV fx shortening, PLAX chordal (L)    20  %   >=29 LV PW thickness, ED           13.8 mm   --------- IVS/LV PW ratio, ED           1.18     <=1.3  Ventricular septum            Value    Reference IVS thickness, ED            16.3 mm   ---------  Aorta                  Value    Reference Aortic root ID, ED            29  mm   ---------  Left atrium               Value    Reference LA ID, A-P, ES              47  mm   --------- LA ID/bsa, A-P              2.17 cm/m^2 <=2.2 LA volume, S               134  ml   --------- LA volume/bsa, S  61.9 ml/m^2 --------- LA volume, ES, 1-p A4C          134  ml   --------- LA volume/bsa, ES, 1-p A4C        61.9 ml/m^2 --------- LA volume, ES, 1-p A2C          132  ml   --------- LA volume/bsa,  ES, 1-p A2C        61  ml/m^2 ---------  Legend: (L) and (H) mark values outside specified reference range.  ------------------------------------------------------------------- Prepared and Electronically Authenticated by  Loralie Champagne, M.D. 2016-03-30T16:42:33   Assessment/Plan: S/P Procedure(s) (LRB): LEFT HEART CATHETERIZATION WITH CORONARY ANGIOGRAM (N/A) TEMPORARY PACEMAKER INSERTION   I have again reviewed the indications, risks and potential benefits of surgery with [name] [name].  All questions answered.  For OR tomorrow.  I spent in excess of 15 minutes during the conduct of this hospital encounter and >50% of this time involved direct face-to-face encounter with the patient for counseling and/or coordination of their care.    [name] 11/13/2014 8:05 AM

## 2014-11-13 NOTE — Progress Notes (Addendum)
CARDIAC REHAB PHASE I   PRE:  Rate/Rhythm: 70 SR  BP:  Supine:   Sitting: 159/67  Standing:    SaO2: 94 RA  MODE:  Ambulation: 350 ft   POST:  Rate/Rhythm: 95 SR  BP:  Supine:   Sitting: 177/81  Standing:    SaO2: 97 RA 1055-1125 Assisted X 2 and used walker to ambulate. Gait steady with walker. Pt is DOE by end of walk, denies any cp.RA sat after walk 97%, BP after walk 177/81. Pt back to recliner after walk with call light in reach in reach. Completed pre-op education with pt and her daughter. I gave them surgery booklet and pt care guide. We discussed sternal precautions, use of IS and walking post-op. Discussed with pt's daughter that she would need someone with her 24/7 for at least the first week.Pt lives alone and all of her family live in [place]. Her daughter is going to work on a plan. Placed surgery video on for pt and daughter to watch.  Rodney Langton RN 11/13/2014 11:23 AM

## 2014-11-13 NOTE — Progress Notes (Signed)
Physical Therapy Treatment Patient Details Name: [name] [name] MRN: [id number] DOB: March 05, 1946 Today's Date: 11/13/2014    History of Present Illness 69 year old female with PMH which is significant for HTN and asthma. She presented to [facility] ED 3/16 c/o chest tightness and weakness x 1 week. In ED she was found to be in new onset AFib with RVR. She was seen by cardiology on day of admission who recommended anticoagulation. Echo showed a new cardiomyopathy with EF 35-45% and diffuse hypokineses. Troponin was mildly elevated but was a flat trend. She was taken for cath 3/21 but again developed acute onset SOB and tachypnea. She felt as though she could not breathe, however sats remained 100% on [place]. She progressed to have a cardiac arrest, which those present at the time likely started as a respiratory arrest. Cath revealed 80% blockages and surgery being consulted to see if pt is a CABG candidate. Pt with significant PMHx of HTN, asthma, and arthritis.  Intubation 11/03/14- 11/07/14.    PT Comments    Pt doing well with mobility. Will need a reorder for PT after CABG.  Follow Up Recommendations  Other (comment) (TBA after surgery)     Equipment Recommendations  Other (comment) (TBA after surgery)    Recommendations for Other Services       Precautions / Restrictions Precautions Precautions: Fall    Mobility  Bed Mobility Overal bed mobility: Modified Independent Bed Mobility: Supine to Sit;Sit to Supine     Supine to sit: Modified independent (Device/Increase time)        Transfers Overall transfer level: Modified independent Equipment used: Rolling walker (2 wheeled) Transfers: Sit to/from Stand Sit to Stand: Modified independent (Device/Increase time)            Ambulation/Gait Ambulation/Gait assistance: Min guard Ambulation Distance (Feet): 175 Feet Assistive device: Rolling walker (2 wheeled) Gait Pattern/deviations: Step-through pattern;Decreased stride length    Gait velocity interpretation: Below normal speed for age/gender     Stairs            Wheelchair Mobility    Modified Rankin (Stroke Patients Only)       Balance Overall balance assessment: Needs assistance Sitting-balance support: No upper extremity supported;Feet supported Sitting balance-Leahy Scale: Normal     Standing balance support: No upper extremity supported Standing balance-Leahy Scale: Good                      Cognition Arousal/Alertness: Awake/alert Behavior During Therapy: WFL for tasks assessed/performed Overall Cognitive Status: Within Functional Limits for tasks assessed                      Exercises      General Comments        Pertinent Vitals/Pain      Home Living                      Prior Function            PT Goals (current goals can now be found in the care plan section) Progress towards PT goals: Progressing toward goals    Frequency       PT Plan Other (comment) (Will need to update after surgery and re-evaluation)    Co-evaluation             End of Session   Activity Tolerance: Patient tolerated treatment well Patient left: in bed;with call bell/phone within reach  Time: 1224-4975 PT Time Calculation (min) (ACUTE ONLY): 10 min  Charges:  $Gait Training: 8-22 mins                    G Codes:      Clemencia Helzer December 05, 2014, 2:47 PM  [facility] PT [phone]

## 2014-11-13 NOTE — Progress Notes (Signed)
Patient awake off and on through out the night, increased restlessness noted. Patient states that " I have been restless ever since I came in, and I am tired of being in the hospital."  Encouragement given to patient.

## 2014-11-13 NOTE — Progress Notes (Signed)
patietn very agitated and anxious this morning, stated she feels like a dog tied to a chain, explained to her what the equipment was and why it needed to be there.  Also explained to her that she can move around room and things we would do it together,  She is aware of her need for heart surgery but doesn't understand the gravity of it.  Will address concerns for anxiety with physician and continue to monitor patient

## 2014-11-13 NOTE — Progress Notes (Signed)
NUTRITION FOLLOW UP  Intervention:   -Continue Ensure Enlive po BID, each supplement provides 350 kcal and 20 grams of protein  Nutrition Dx:   Inadequate oral intake now related to dysphagia and taste changes as evidenced by meal completion < 50%; progressing  Goal:   Pt to meet >/= 90% of their estimated nutrition needs; progressing  Monitor:   PO intake, supplement acceptance, weight trends, labs, I/O's  Assessment:   Pt admitted with new onset AFib with RVR. Pt scheduled for LHC on 3/18 but refused, rescheduled for 3/21 pt had SOB and progressed to cardiac arrest and intubated.  Pt extubated 3/25 and started on dysphagia diet 3/26 per SLP.   Pt reports improved appetite. She is delighted that her diet has been advanced to a regular consistency (SLP upgraded on 11/11/14). She denies any difficulty with current diet.  Intake has gradually improved (PO 20-75%). Pt consumed 100% of her breakfast. She also reports she has been taking her Ensure.  Pt is scheduled for CABG 11/14/14. Reinforced importance of good meal and supplement intake before and after surgery to promote healing process.  Pt expressed gratefulness for visit.  Labs reviewed. CO2: 18, Creat: 1.13, Glucose: 114. CBGS: 92-134.  Height: Ht Readings from Last 1 Encounters:  11/11/14 5' 7.5" (1.715 m)    Weight Status:   Wt Readings from Last 1 Encounters:  11/13/14 228 lb 11.2 oz (103.738 kg)   11/10/14 220 lb 10.9 oz (100.1 kg)  Admission weight: 215 lb (97.7 kg) 3/16     Re-estimated needs:  Kcal: 1800-2000 Protein: 95-105 grams Fluid: 1.8-2.0 L  Skin: rt leg blister  Diet Order: Diet Heart Room service appropriate?: Yes; Fluid consistency:: Thin   Intake/Output Summary (Last 24 hours) at 11/13/14 0927 Last data filed at 11/13/14 0700  Gross per 24 hour  Intake 1043.6 ml  Output    650 ml  Net  393.6 ml    Last BM: 11/12/14   Labs:   Recent Labs Lab 11/07/14 0420 11/08/14 0500 11/09/14 0501   11/11/14 0230 11/12/14 0432 11/13/14 0234  NA 141 144 141  < > 142 142 141  K 3.8 3.4* 2.9*  < > 3.4* 3.5 4.2  CL 115* 111 111  < > 110 111 110  CO2 19 23 23   < > 20 23 18*  BUN 21 20 13   < > 11 11 10   CREATININE 1.13* 1.10 0.95  < > 1.04 1.13* 1.11*  CALCIUM 8.7 8.8 8.5  < > 9.3 9.2 9.4  MG 2.1 1.8 2.0  --   --   --   --   PHOS 3.9 3.5 3.0  --   --   --   --   GLUCOSE 136* 122* 107*  < > 107* 108* 114*  < > = values in this interval not displayed.  CBG (last 3)   Recent Labs  11/12/14 0816 11/12/14 2108 11/13/14 0826  GLUCAP 110* 92 134*    Scheduled Meds: . amiodarone  400 mg Oral BID  . antiseptic oral rinse  7 mL Mouth Rinse BID  . aspirin EC  81 mg Oral Daily  . atorvastatin  80 mg Oral q1800  . cloNIDine  0.2 mg Transdermal Weekly  . docusate sodium  100 mg Oral BID  . feeding supplement (ENSURE ENLIVE)  237 mL Oral BID BM  . hydrochlorothiazide  12.5 mg Oral Daily  . lisinopril  40 mg Oral Daily  . metoprolol tartrate  25 mg Oral 3 times per day    Continuous Infusions: . sodium chloride Stopped (11/09/14 1300)  . heparin 1,600 Units/hr (11/13/14 0700)    Shantee Hayne A. Jimmye Norman, RD, LDN, CDE Pager: [phone] After hours Pager: [phone]

## 2014-11-13 NOTE — Progress Notes (Addendum)
Pre-op Cardiac Surgery  Carotid Findings ; Bilateral:  1-39% ICA stenosis.  Vertebral artery flow is antegrade.    11/13/2014 9:30 AM Maudry Mayhew, RVT, RDCS, RDMS     Upper Extremity Right Left  Brachial Pressures 170 Triphasic 189 Triphasic  Radial Waveforms Triphasic Triphasic  Ulnar Waveforms Triphasic Triphasic  Palmar Arch (Allen's Test) Normal Normal   Findings:  Doppler waveforms remained normal bilaterally with both radial and ulnar compressions.    Lower  Extremity Right Left  Dorsalis Pedis    Anterior Tibial    Posterior Tibial    Ankle/Brachial Indices      Findings:  Palpable pedal pulses bilaterally at rest.   Toma Copier, [place] 11/13/2014. 8:24 PM

## 2014-11-13 NOTE — Progress Notes (Signed)
ANTICOAGULATION CONSULT NOTE - Follow Up Consult  Pharmacy Consult for heparin Indication: atrial fibrillation  Allergies  Allergen Reactions  . Bee Venom Anaphylaxis  . Shrimp [Shellfish Allergy] Swelling    Patient Measurements: Height: 5' 7.5" (171.5 cm) Weight: 228 lb 11.2 oz (103.738 kg) IBW/kg (Calculated) : 62.75 Heparin Dosing Weight: 85 kg  Vital Signs: Temp: 98.4 F (36.9 C) (03/31 1204) Temp Source: Oral (03/31 1204) BP: 172/99 mmHg (03/31 1204)  Labs:  Recent Labs  11/11/14 0230 11/12/14 0431 11/12/14 0432 11/13/14 0234 11/13/14 0240  HGB 13.4 12.3  --  12.9  --   HCT 39.6 37.1  --  38.7  --   PLT 126* 122*  --  107*  --   HEPARINUNFRC 0.36 0.40  --   --  0.58  CREATININE 1.04  --  1.13* 1.11*  --     Estimated Creatinine Clearance: 59.8 mL/min (by C-G formula based on Cr of 1.11).   Medications:  Scheduled:  . [START ON 11/14/2014] aminocaproic acid (AMICAR) for OHS   Intravenous To OR  . amiodarone  400 mg Oral BID  . antiseptic oral rinse  7 mL Mouth Rinse BID  . aspirin EC  81 mg Oral Daily  . atorvastatin  80 mg Oral q1800  . [START ON 11/14/2014] cefUROXime (ZINACEF)  IV  1.5 g Intravenous To OR  . [START ON 11/14/2014] cefUROXime (ZINACEF)  IV  750 mg Intravenous To OR  . chlorhexidine  60 mL Topical Once   And  . [START ON 11/14/2014] chlorhexidine  60 mL Topical Once  . cloNIDine  0.2 mg Transdermal Weekly  . [START ON 11/14/2014] dexmedetomidine  0.1-0.7 mcg/kg/hr Intravenous To OR  . docusate sodium  100 mg Oral BID  . [START ON 11/14/2014] DOPamine  0-10 mcg/kg/min Intravenous To OR  . [START ON 11/14/2014] epinephrine  0-10 mcg/min Intravenous To OR  . feeding supplement (ENSURE ENLIVE)  237 mL Oral BID BM  . [START ON 11/14/2014] heparin-papaverine-plasmalyte irrigation   Irrigation To OR  . [START ON 11/14/2014] heparin 30,000 units/NS 1000 mL solution for CELLSAVER   Other To OR  . hydrochlorothiazide  12.5 mg Oral Daily  . [START ON 11/14/2014]  insulin (NOVOLIN-R) infusion   Intravenous To OR  . lisinopril  40 mg Oral Daily  . [START ON 11/14/2014] magnesium sulfate  40 mEq Other To OR  . [START ON 11/14/2014] metoprolol tartrate  12.5 mg Oral Once  . metoprolol tartrate  25 mg Oral 3 times per day  . [START ON 11/14/2014] nitroGLYCERIN  2-200 mcg/min Intravenous To OR  . [START ON 11/14/2014] phenylephrine (NEO-SYNEPHRINE) Adult infusion  30-200 mcg/min Intravenous To OR  . [START ON 11/14/2014] potassium chloride  80 mEq Other To OR  . [START ON 11/14/2014] vancomycin 1000 mg in NS (1000 ml) irrigation for Dr. Roxy Manns case   Irrigation To OR  . [START ON 11/14/2014] vancomycin  1,500 mg Intravenous To OR    Assessment: 69 year old female on IV heparin (apixaban on hold) for atrial fibrillation. She is s/p cardiac arrest and s/p cath noted with multivessel CAD.  Heparin is at 1600 units/hr and noted at goal (HL= 0.58). Patient noted with afib now on amiodarone with transition to po today.  Plans for CABG 4/1 and heparin to stop at midnight.   Goal of Therapy:  Heparin level 0.3-0.7 units/ml Monitor platelets by anticoagulation protocol: Yes   Plan:  -No heparin changes needed (heparin off at midnight  for OR) -Will follow plans post CABG  Hildred Laser, Pharm D 11/13/2014 1:25 PM

## 2014-11-14 ENCOUNTER — Inpatient Hospital Stay (HOSPITAL_COMMUNITY): Payer: Commercial Managed Care - HMO | Admitting: Certified Registered Nurse Anesthetist

## 2014-11-14 ENCOUNTER — Encounter (HOSPITAL_COMMUNITY): Payer: Self-pay | Admitting: Thoracic Surgery (Cardiothoracic Vascular Surgery)

## 2014-11-14 ENCOUNTER — Encounter (HOSPITAL_COMMUNITY)
Admission: EM | Disposition: A | Discharge status: Home / Self Care | Payer: Commercial Managed Care - HMO | Source: Home / Self Care | Attending: Thoracic Surgery (Cardiothoracic Vascular Surgery)

## 2014-11-14 ENCOUNTER — Inpatient Hospital Stay (HOSPITAL_COMMUNITY): Payer: Commercial Managed Care - HMO

## 2014-11-14 DIAGNOSIS — Z951 Presence of aortocoronary bypass graft: Secondary | ICD-10-CM

## 2014-11-14 HISTORY — PX: TEE WITHOUT CARDIOVERSION: SHX5443

## 2014-11-14 HISTORY — PX: CLIPPING OF ATRIAL APPENDAGE: SHX5773

## 2014-11-14 HISTORY — PX: CORONARY ARTERY BYPASS GRAFT: SHX141

## 2014-11-14 HISTORY — DX: Presence of aortocoronary bypass graft: Z95.1

## 2014-11-14 LAB — POCT I-STAT, CHEM 8
BUN: 12 mg/dL (ref 6–23)
BUN: 10 mg/dL (ref 6–23)
BUN: 11 mg/dL (ref 6–23)
BUN: 10 mg/dL (ref 6–23)
BUN: 11 mg/dL (ref 6–23)
BUN: 10 mg/dL (ref 6–23)
Calcium, Ion: 1.27 mmol/L (ref 1.13–1.30)
Calcium, Ion: 1.06 mmol/L — ABNORMAL LOW (ref 1.13–1.30)
Calcium, Ion: 1.25 mmol/L (ref 1.13–1.30)
Calcium, Ion: 1.16 mmol/L (ref 1.13–1.30)
Calcium, Ion: 1.18 mmol/L (ref 1.13–1.30)
Calcium, Ion: 1.23 mmol/L (ref 1.13–1.30)
Chloride: 108 mmol/L (ref 96–112)
Chloride: 103 mmol/L (ref 96–112)
Chloride: 107 mmol/L (ref 96–112)
Chloride: 107 mmol/L (ref 96–112)
Chloride: 106 mmol/L (ref 96–112)
Chloride: 111 mmol/L (ref 96–112)
Creatinine, Ser: 1 mg/dL (ref 0.50–1.10)
Creatinine, Ser: 0.9 mg/dL (ref 0.50–1.10)
Creatinine, Ser: 1.1 mg/dL (ref 0.50–1.10)
Creatinine, Ser: 1 mg/dL (ref 0.50–1.10)
Creatinine, Ser: 1 mg/dL (ref 0.50–1.10)
Creatinine, Ser: 1 mg/dL (ref 0.50–1.10)
Glucose, Bld: 129 mg/dL — ABNORMAL HIGH (ref 70–99)
Glucose, Bld: 110 mg/dL — ABNORMAL HIGH (ref 70–99)
Glucose, Bld: 121 mg/dL — ABNORMAL HIGH (ref 70–99)
Glucose, Bld: 131 mg/dL — ABNORMAL HIGH (ref 70–99)
Glucose, Bld: 123 mg/dL — ABNORMAL HIGH (ref 70–99)
Glucose, Bld: 121 mg/dL — ABNORMAL HIGH (ref 70–99)
HCT: 37 % (ref 36.0–46.0)
HCT: 26 % — ABNORMAL LOW (ref 36.0–46.0)
HCT: 32 % — ABNORMAL LOW (ref 36.0–46.0)
HCT: 26 % — ABNORMAL LOW (ref 36.0–46.0)
HCT: 26 % — ABNORMAL LOW (ref 36.0–46.0)
HCT: 33 % — ABNORMAL LOW (ref 36.0–46.0)
Hemoglobin: 12.6 g/dL (ref 12.0–15.0)
Hemoglobin: 10.9 g/dL — ABNORMAL LOW (ref 12.0–15.0)
Hemoglobin: 8.8 g/dL — ABNORMAL LOW (ref 12.0–15.0)
Hemoglobin: 8.8 g/dL — ABNORMAL LOW (ref 12.0–15.0)
Hemoglobin: 8.8 g/dL — ABNORMAL LOW (ref 12.0–15.0)
Hemoglobin: 11.2 g/dL — ABNORMAL LOW (ref 12.0–15.0)
Potassium: 3.6 mmol/L (ref 3.5–5.1)
Potassium: 3.5 mmol/L (ref 3.5–5.1)
Potassium: 3.9 mmol/L (ref 3.5–5.1)
Potassium: 4.6 mmol/L (ref 3.5–5.1)
Potassium: 4 mmol/L (ref 3.5–5.1)
Potassium: 4.3 mmol/L (ref 3.5–5.1)
Sodium: 143 mmol/L (ref 135–145)
Sodium: 142 mmol/L (ref 135–145)
Sodium: 143 mmol/L (ref 135–145)
Sodium: 142 mmol/L (ref 135–145)
Sodium: 143 mmol/L (ref 135–145)
Sodium: 141 mmol/L (ref 135–145)
TCO2: 19 mmol/L (ref 0–100)
TCO2: 19 mmol/L (ref 0–100)
TCO2: 20 mmol/L (ref 0–100)
TCO2: 21 mmol/L (ref 0–100)
TCO2: 20 mmol/L (ref 0–100)
TCO2: 16 mmol/L (ref 0–100)

## 2014-11-14 LAB — CBC
HCT: 37.4 % (ref 36.0–46.0)
HCT: 31.2 % — ABNORMAL LOW (ref 36.0–46.0)
HCT: 32.8 % — ABNORMAL LOW (ref 36.0–46.0)
Hemoglobin: 12.2 g/dL (ref 12.0–15.0)
Hemoglobin: 10.2 g/dL — ABNORMAL LOW (ref 12.0–15.0)
Hemoglobin: 10.8 g/dL — ABNORMAL LOW (ref 12.0–15.0)
MCH: 28.8 pg (ref 26.0–34.0)
MCH: 29.2 pg (ref 26.0–34.0)
MCH: 29 pg (ref 26.0–34.0)
MCHC: 32.6 g/dL (ref 30.0–36.0)
MCHC: 32.7 g/dL (ref 30.0–36.0)
MCHC: 32.9 g/dL (ref 30.0–36.0)
MCV: 88.2 fL (ref 78.0–100.0)
MCV: 89.4 fL (ref 78.0–100.0)
MCV: 88.2 fL (ref 78.0–100.0)
Platelets: 118 10*3/uL — ABNORMAL LOW (ref 150–400)
Platelets: 88 10*3/uL — ABNORMAL LOW (ref 150–400)
Platelets: 93 10*3/uL — ABNORMAL LOW (ref 150–400)
RBC: 4.24 MIL/uL (ref 3.87–5.11)
RBC: 3.49 MIL/uL — ABNORMAL LOW (ref 3.87–5.11)
RBC: 3.72 MIL/uL — ABNORMAL LOW (ref 3.87–5.11)
RDW: 16.6 % — ABNORMAL HIGH (ref 11.5–15.5)
RDW: 16.5 % — ABNORMAL HIGH (ref 11.5–15.5)
RDW: 16.5 % — ABNORMAL HIGH (ref 11.5–15.5)
WBC: 10.1 10*3/uL (ref 4.0–10.5)
WBC: 16.1 10*3/uL — ABNORMAL HIGH (ref 4.0–10.5)
WBC: 18.3 10*3/uL — ABNORMAL HIGH (ref 4.0–10.5)

## 2014-11-14 LAB — PROTIME-INR
INR: 1.11 (ref 0.00–1.49)
INR: 1.38 (ref 0.00–1.49)
Prothrombin Time: 14.5 s (ref 11.6–15.2)
Prothrombin Time: 17.1 seconds — ABNORMAL HIGH (ref 11.6–15.2)

## 2014-11-14 LAB — POCT I-STAT 3, ART BLOOD GAS (G3+)
Acid-base deficit: 4 mmol/L — ABNORMAL HIGH (ref 0.0–2.0)
Acid-base deficit: 4 mmol/L — ABNORMAL HIGH (ref 0.0–2.0)
Acid-base deficit: 3 mmol/L — ABNORMAL HIGH (ref 0.0–2.0)
Acid-base deficit: 4 mmol/L — ABNORMAL HIGH (ref 0.0–2.0)
Acid-base deficit: 7 mmol/L — ABNORMAL HIGH (ref 0.0–2.0)
Acid-base deficit: 7 mmol/L — ABNORMAL HIGH (ref 0.0–2.0)
Bicarbonate: 21 mEq/L (ref 20.0–24.0)
Bicarbonate: 20.7 mEq/L (ref 20.0–24.0)
Bicarbonate: 21.9 mEq/L (ref 20.0–24.0)
Bicarbonate: 21.6 mEq/L (ref 20.0–24.0)
Bicarbonate: 18.1 mEq/L — ABNORMAL LOW (ref 20.0–24.0)
Bicarbonate: 17.9 mEq/L — ABNORMAL LOW (ref 20.0–24.0)
O2 Saturation: 100 %
O2 Saturation: 100 %
O2 Saturation: 100 %
O2 Saturation: 92 %
O2 Saturation: 95 %
O2 Saturation: 92 %
Patient temperature: 36.2
Patient temperature: 36.7
Patient temperature: 36.8
TCO2: 22 mmol/L (ref 0–100)
TCO2: 22 mmol/L (ref 0–100)
TCO2: 23 mmol/L (ref 0–100)
TCO2: 23 mmol/L (ref 0–100)
TCO2: 19 mmol/L (ref 0–100)
TCO2: 19 mmol/L (ref 0–100)
pCO2 arterial: 37 mmHg (ref 35.0–45.0)
pCO2 arterial: 34.6 mmHg — ABNORMAL LOW (ref 35.0–45.0)
pCO2 arterial: 40.1 mmHg (ref 35.0–45.0)
pCO2 arterial: 39 mmHg (ref 35.0–45.0)
pCO2 arterial: 31.8 mmHg — ABNORMAL LOW (ref 35.0–45.0)
pCO2 arterial: 34 mmHg — ABNORMAL LOW (ref 35.0–45.0)
pH, Arterial: 7.362 (ref 7.350–7.450)
pH, Arterial: 7.385 (ref 7.350–7.450)
pH, Arterial: 7.345 — ABNORMAL LOW (ref 7.350–7.450)
pH, Arterial: 7.348 — ABNORMAL LOW (ref 7.350–7.450)
pH, Arterial: 7.362 (ref 7.350–7.450)
pH, Arterial: 7.328 — ABNORMAL LOW (ref 7.350–7.450)
pO2, Arterial: 268 mmHg — ABNORMAL HIGH (ref 80.0–100.0)
pO2, Arterial: 289 mmHg — ABNORMAL HIGH (ref 80.0–100.0)
pO2, Arterial: 252 mmHg — ABNORMAL HIGH (ref 80.0–100.0)
pO2, Arterial: 65 mmHg — ABNORMAL LOW (ref 80.0–100.0)
pO2, Arterial: 74 mmHg — ABNORMAL LOW (ref 80.0–100.0)
pO2, Arterial: 68 mmHg — ABNORMAL LOW (ref 80.0–100.0)

## 2014-11-14 LAB — BASIC METABOLIC PANEL
Anion gap: 4 — ABNORMAL LOW (ref 5–15)
BUN: 12 mg/dL (ref 6–23)
CO2: 25 mmol/L (ref 19–32)
Calcium: 9.3 mg/dL (ref 8.4–10.5)
Chloride: 112 mmol/L (ref 96–112)
Creatinine, Ser: 1.2 mg/dL — ABNORMAL HIGH (ref 0.50–1.10)
GFR calc Af Amer: 52 mL/min — ABNORMAL LOW (ref >90)
GFR calc non Af Amer: 45 mL/min — ABNORMAL LOW (ref >90)
Glucose, Bld: 108 mg/dL — ABNORMAL HIGH (ref 70–99)
Potassium: 3.5 mmol/L (ref 3.5–5.1)
Sodium: 141 mmol/L (ref 135–145)

## 2014-11-14 LAB — POCT I-STAT 4, (NA,K, GLUC, HGB,HCT)
Glucose, Bld: 117 mg/dL — ABNORMAL HIGH (ref 70–99)
HCT: 44 % (ref 36.0–46.0)
Hemoglobin: 15 g/dL (ref 12.0–15.0)
Potassium: 4 mmol/L (ref 3.5–5.1)
Sodium: 143 mmol/L (ref 135–145)

## 2014-11-14 LAB — GLUCOSE, CAPILLARY
Glucose-Capillary: 109 mg/dL — ABNORMAL HIGH (ref 70–99)
Glucose-Capillary: 106 mg/dL — ABNORMAL HIGH (ref 70–99)
Glucose-Capillary: 68 mg/dL — ABNORMAL LOW (ref 70–99)
Glucose-Capillary: 98 mg/dL (ref 70–99)
Glucose-Capillary: 90 mg/dL (ref 70–99)
Glucose-Capillary: 106 mg/dL — ABNORMAL HIGH (ref 70–99)
Glucose-Capillary: 109 mg/dL — ABNORMAL HIGH (ref 70–99)

## 2014-11-14 LAB — CREATININE, SERUM
Creatinine, Ser: 1.09 mg/dL (ref 0.50–1.10)
GFR calc Af Amer: 59 mL/min — ABNORMAL LOW (ref >90)
GFR calc non Af Amer: 51 mL/min — ABNORMAL LOW (ref >90)

## 2014-11-14 LAB — APTT
aPTT: 38 s — ABNORMAL HIGH (ref 24–37)
aPTT: 40 seconds — ABNORMAL HIGH (ref 24–37)

## 2014-11-14 LAB — PLATELET COUNT: Platelets: 75 10*3/uL — ABNORMAL LOW (ref 150–400)

## 2014-11-14 LAB — HEMOGLOBIN AND HEMATOCRIT, BLOOD
HCT: 25.7 % — ABNORMAL LOW (ref 36.0–46.0)
Hemoglobin: 8.6 g/dL — ABNORMAL LOW (ref 12.0–15.0)

## 2014-11-14 LAB — MAGNESIUM: Magnesium: 2.7 mg/dL — ABNORMAL HIGH (ref 1.5–2.5)

## 2014-11-14 LAB — SURGICAL PCR SCREEN: MRSA, PCR: NEGATIVE

## 2014-11-14 SURGERY — CORONARY ARTERY BYPASS GRAFTING (CABG)
Anesthesia: General | Site: Chest

## 2014-11-14 MED ORDER — ACETAMINOPHEN 650 MG RE SUPP
650.0000 mg | Freq: Once | RECTAL | Status: AC
  Administered 2014-11-14: 650 mg via RECTAL

## 2014-11-14 MED ORDER — PROPOFOL 10 MG/ML IV BOLUS
INTRAVENOUS | Status: AC
  Filled 2014-11-14: qty 20

## 2014-11-14 MED ORDER — FENTANYL CITRATE 0.05 MG/ML IJ SOLN
INTRAMUSCULAR | Status: AC
  Filled 2014-11-14: qty 5

## 2014-11-14 MED ORDER — CEFUROXIME SODIUM 1.5 G IJ SOLR
1.5000 g | Freq: Two times a day (BID) | INTRAMUSCULAR | Status: AC
  Administered 2014-11-14 – 2014-11-16 (×4): 1.5 g via INTRAVENOUS
  Filled 2014-11-14 (×6): qty 1.5

## 2014-11-14 MED ORDER — SODIUM CHLORIDE 0.9 % IR SOLN
Status: DC | PRN
  Administered 2014-11-14: 2000 mL

## 2014-11-14 MED ORDER — MIDAZOLAM HCL 5 MG/5ML IJ SOLN
INTRAMUSCULAR | Status: DC | PRN
  Administered 2014-11-14: 2 mg via INTRAVENOUS
  Administered 2014-11-14: 1 mg via INTRAVENOUS
  Administered 2014-11-14: 4 mg via INTRAVENOUS
  Administered 2014-11-14: 2 mg via INTRAVENOUS
  Administered 2014-11-14: 1 mg via INTRAVENOUS

## 2014-11-14 MED ORDER — VANCOMYCIN HCL IN DEXTROSE 1-5 GM/200ML-% IV SOLN
1000.0000 mg | Freq: Once | INTRAVENOUS | Status: AC
  Administered 2014-11-14: 1000 mg via INTRAVENOUS
  Filled 2014-11-14: qty 200

## 2014-11-14 MED ORDER — ONDANSETRON HCL 4 MG/2ML IJ SOLN: 4.0000 mg | Freq: Four times a day (QID) | INTRAMUSCULAR | Status: DC | PRN

## 2014-11-14 MED ORDER — SODIUM CHLORIDE 0.9 % IV SOLN
250.0000 [IU] | INTRAVENOUS | Status: DC | PRN
  Administered 2014-11-14: 2.1 [IU]/h via INTRAVENOUS

## 2014-11-14 MED ORDER — PROTAMINE SULFATE 10 MG/ML IV SOLN
INTRAVENOUS | Status: AC
  Filled 2014-11-14: qty 25

## 2014-11-14 MED ORDER — PHENYLEPHRINE HCL 10 MG/ML IJ SOLN
0.0000 ug/min | INTRAVENOUS | Status: DC
  Filled 2014-11-14: qty 2

## 2014-11-14 MED ORDER — MIDAZOLAM HCL 10 MG/2ML IJ SOLN
INTRAMUSCULAR | Status: AC
  Filled 2014-11-14: qty 2

## 2014-11-14 MED ORDER — MORPHINE SULFATE 2 MG/ML IJ SOLN
2.0000 mg | INTRAMUSCULAR | Status: DC | PRN
  Administered 2014-11-14 (×4): 4 mg via INTRAVENOUS
  Administered 2014-11-15: 2 mg via INTRAVENOUS
  Administered 2014-11-15 (×2): 4 mg via INTRAVENOUS
  Filled 2014-11-14: qty 2
  Filled 2014-11-14: qty 1
  Filled 2014-11-14 (×4): qty 2

## 2014-11-14 MED ORDER — MORPHINE SULFATE 2 MG/ML IJ SOLN
1.0000 mg | INTRAMUSCULAR | Status: DC | PRN
  Filled 2014-11-14: qty 2

## 2014-11-14 MED ORDER — SODIUM CHLORIDE 0.45 % IV SOLN
INTRAVENOUS | Status: DC | PRN
  Administered 2014-11-14: 20 mL/h via INTRAVENOUS

## 2014-11-14 MED ORDER — OXYCODONE HCL 5 MG PO TABS
5.0000 mg | ORAL_TABLET | ORAL | Status: DC | PRN
  Administered 2014-11-15 (×3): 10 mg via ORAL
  Administered 2014-11-15: 5 mg via ORAL
  Administered 2014-11-16 – 2014-11-18 (×5): 10 mg via ORAL
  Administered 2014-11-20: 5 mg via ORAL
  Administered 2014-11-22 – 2014-11-23 (×4): 10 mg via ORAL
  Filled 2014-11-14 (×8): qty 2
  Filled 2014-11-14: qty 1
  Filled 2014-11-14: qty 2
  Filled 2014-11-14: qty 1
  Filled 2014-11-14 (×3): qty 2

## 2014-11-14 MED ORDER — LACTATED RINGERS IV SOLN
INTRAVENOUS | Status: DC | PRN
  Administered 2014-11-14: 07:00:00 via INTRAVENOUS

## 2014-11-14 MED ORDER — INSULIN REGULAR HUMAN 100 UNIT/ML IJ SOLN
INTRAMUSCULAR | Status: DC
  Filled 2014-11-14: qty 2.5

## 2014-11-14 MED ORDER — METOPROLOL TARTRATE 25 MG/10 ML ORAL SUSPENSION
12.5000 mg | Freq: Two times a day (BID) | ORAL | Status: DC
Start: 2014-11-14 — End: 2014-11-15
  Filled 2014-11-14 (×3): qty 5

## 2014-11-14 MED ORDER — INSULIN ASPART 100 UNIT/ML ~~LOC~~ SOLN
0.0000 [IU] | SUBCUTANEOUS | Status: DC
  Administered 2014-11-15 – 2014-11-16 (×8): 2 [IU] via SUBCUTANEOUS

## 2014-11-14 MED ORDER — NITROGLYCERIN IN D5W 200-5 MCG/ML-% IV SOLN: 0.0000 ug/min | INTRAVENOUS | Status: DC

## 2014-11-14 MED ORDER — PROPOFOL 10 MG/ML IV BOLUS
INTRAVENOUS | Status: DC | PRN
  Administered 2014-11-14: 20 mg via INTRAVENOUS

## 2014-11-14 MED ORDER — TRAMADOL HCL 50 MG PO TABS
50.0000 mg | ORAL_TABLET | ORAL | Status: DC | PRN
  Administered 2014-11-15 – 2014-11-18 (×4): 100 mg via ORAL
  Administered 2014-11-20: 50 mg via ORAL
  Administered 2014-11-20 (×2): 100 mg via ORAL
  Filled 2014-11-14 (×7): qty 2

## 2014-11-14 MED ORDER — PHENYLEPHRINE HCL 10 MG/ML IJ SOLN
10.0000 mg | INTRAVENOUS | Status: DC | PRN
  Administered 2014-11-14: 15 ug/min via INTRAVENOUS

## 2014-11-14 MED ORDER — METOPROLOL TARTRATE 1 MG/ML IV SOLN: 2.5000 mg | INTRAVENOUS | Status: DC | PRN

## 2014-11-14 MED ORDER — SODIUM CHLORIDE 0.9 % IJ SOLN
3.0000 mL | INTRAMUSCULAR | Status: DC | PRN
  Administered 2014-11-15: 3 mL via INTRAVENOUS
  Administered 2014-11-18: via INTRAVENOUS
  Filled 2014-11-14 (×2): qty 3

## 2014-11-14 MED ORDER — DEXMEDETOMIDINE HCL IN NACL 200 MCG/50ML IV SOLN
0.0000 ug/kg/h | INTRAVENOUS | Status: DC
Start: 2014-11-14 — End: 2014-11-15
  Administered 2014-11-14: 0.3 ug/kg/h via INTRAVENOUS
  Administered 2014-11-15: 0.1 ug/kg/h via INTRAVENOUS
  Filled 2014-11-14 (×2): qty 50

## 2014-11-14 MED ORDER — SODIUM CHLORIDE 0.9 % IV SOLN
INTRAVENOUS | Status: AC
  Administered 2014-11-14: 100 mL/h via INTRAVENOUS

## 2014-11-14 MED ORDER — BISACODYL 10 MG RE SUPP
10.0000 mg | Freq: Every day | RECTAL | Status: DC
  Filled 2014-11-14: qty 1

## 2014-11-14 MED ORDER — LACTATED RINGERS IV SOLN
INTRAVENOUS | Status: DC | PRN
  Administered 2014-11-14 (×2): via INTRAVENOUS

## 2014-11-14 MED ORDER — FAMOTIDINE IN NACL 20-0.9 MG/50ML-% IV SOLN
20.0000 mg | Freq: Two times a day (BID) | INTRAVENOUS | Status: AC
  Administered 2014-11-14: 20 mg via INTRAVENOUS

## 2014-11-14 MED ORDER — NITROGLYCERIN IN D5W 200-5 MCG/ML-% IV SOLN
INTRAVENOUS | Status: DC | PRN
  Administered 2014-11-14: 5 ug/min via INTRAVENOUS

## 2014-11-14 MED ORDER — SODIUM CHLORIDE 0.9 % IV SOLN: INTRAVENOUS | Status: DC

## 2014-11-14 MED ORDER — ASPIRIN EC 325 MG PO TBEC
325.0000 mg | DELAYED_RELEASE_TABLET | Freq: Every day | ORAL | Status: DC
  Administered 2014-11-15 – 2014-11-19 (×5): 325 mg via ORAL
  Filled 2014-11-14 (×6): qty 1

## 2014-11-14 MED ORDER — ACETAMINOPHEN 160 MG/5ML PO SOLN: 650.0000 mg | Freq: Once | ORAL | Status: AC

## 2014-11-14 MED ORDER — ALBUTEROL SULFATE (2.5 MG/3ML) 0.083% IN NEBU: 2.5000 mg | INHALATION_SOLUTION | RESPIRATORY_TRACT | Status: DC | PRN

## 2014-11-14 MED ORDER — ACETAMINOPHEN 500 MG PO TABS
1000.0000 mg | ORAL_TABLET | Freq: Four times a day (QID) | ORAL | Status: AC
  Administered 2014-11-15 – 2014-11-19 (×13): 1000 mg via ORAL
  Filled 2014-11-14 (×21): qty 2

## 2014-11-14 MED ORDER — MIDAZOLAM HCL 2 MG/2ML IJ SOLN: 2.0000 mg | INTRAMUSCULAR | Status: DC | PRN

## 2014-11-14 MED ORDER — INSULIN REGULAR BOLUS VIA INFUSION
0.0000 [IU] | Freq: Three times a day (TID) | INTRAVENOUS | Status: DC
  Filled 2014-11-14: qty 10

## 2014-11-14 MED ORDER — ASPIRIN 81 MG PO CHEW: 324.0000 mg | CHEWABLE_TABLET | Freq: Every day | ORAL | Status: DC

## 2014-11-14 MED ORDER — LACTATED RINGERS IV SOLN: INTRAVENOUS | Status: DC

## 2014-11-14 MED ORDER — MAGNESIUM SULFATE 4 GM/100ML IV SOLN
4.0000 g | Freq: Once | INTRAVENOUS | Status: AC
Start: 2014-11-14 — End: 2014-11-14
  Administered 2014-11-14: 4 g via INTRAVENOUS
  Filled 2014-11-14: qty 100

## 2014-11-14 MED ORDER — EPHEDRINE SULFATE 50 MG/ML IJ SOLN
INTRAMUSCULAR | Status: DC | PRN
  Administered 2014-11-14 (×3): 10 mg via INTRAVENOUS

## 2014-11-14 MED ORDER — LACTATED RINGERS IV SOLN: 500.0000 mL | Freq: Once | INTRAVENOUS | Status: AC | PRN

## 2014-11-14 MED ORDER — PANTOPRAZOLE SODIUM 40 MG PO TBEC
40.0000 mg | DELAYED_RELEASE_TABLET | Freq: Every day | ORAL | Status: DC
  Administered 2014-11-16 – 2014-11-23 (×8): 40 mg via ORAL
  Filled 2014-11-14 (×8): qty 1

## 2014-11-14 MED ORDER — HEPARIN SODIUM (PORCINE) 1000 UNIT/ML IJ SOLN
INTRAMUSCULAR | Status: AC
  Filled 2014-11-14: qty 1

## 2014-11-14 MED ORDER — ALBUTEROL SULFATE HFA 108 (90 BASE) MCG/ACT IN AERS
INHALATION_SPRAY | RESPIRATORY_TRACT | Status: DC | PRN
  Administered 2014-11-14: 2 via RESPIRATORY_TRACT

## 2014-11-14 MED ORDER — ALBUMIN HUMAN 5 % IV SOLN
250.0000 mL | INTRAVENOUS | Status: AC | PRN
  Administered 2014-11-14 – 2014-11-15 (×2): 250 mL via INTRAVENOUS

## 2014-11-14 MED ORDER — FENTANYL CITRATE 0.05 MG/ML IJ SOLN
INTRAMUSCULAR | Status: DC | PRN
  Administered 2014-11-14: 150 ug via INTRAVENOUS
  Administered 2014-11-14: 250 ug via INTRAVENOUS
  Administered 2014-11-14: 100 ug via INTRAVENOUS
  Administered 2014-11-14: 50 ug via INTRAVENOUS
  Administered 2014-11-14: 500 ug via INTRAVENOUS
  Administered 2014-11-14: 50 ug via INTRAVENOUS
  Administered 2014-11-14: 150 ug via INTRAVENOUS
  Administered 2014-11-14: 100 ug via INTRAVENOUS
  Administered 2014-11-14: 150 ug via INTRAVENOUS
  Administered 2014-11-14 (×3): 250 ug via INTRAVENOUS

## 2014-11-14 MED ORDER — METOPROLOL TARTRATE 12.5 MG HALF TABLET
12.5000 mg | ORAL_TABLET | Freq: Two times a day (BID) | ORAL | Status: DC
  Filled 2014-11-14 (×3): qty 1

## 2014-11-14 MED ORDER — HEPARIN SODIUM (PORCINE) 1000 UNIT/ML IJ SOLN
INTRAMUSCULAR | Status: DC | PRN
  Administered 2014-11-14: 26 mL via INTRAVENOUS

## 2014-11-14 MED ORDER — SODIUM CHLORIDE 0.9 % IV SOLN
10.0000 g | INTRAVENOUS | Status: DC | PRN
  Administered 2014-11-14: 5 g/h via INTRAVENOUS

## 2014-11-14 MED ORDER — HEMOSTATIC AGENTS (NO CHARGE) OPTIME
TOPICAL | Status: DC | PRN
Start: 2014-11-14 — End: 2014-11-14
  Administered 2014-11-14: 2 via TOPICAL

## 2014-11-14 MED ORDER — SODIUM CHLORIDE 0.9 % IV SOLN
200.0000 ug | INTRAVENOUS | Status: DC | PRN
  Administered 2014-11-14: .3 ug/kg/h via INTRAVENOUS

## 2014-11-14 MED ORDER — SODIUM CHLORIDE 0.9 % IV SOLN
250.0000 mL | INTRAVENOUS | Status: DC
  Administered 2014-11-18: 11:00:00 via INTRAVENOUS

## 2014-11-14 MED ORDER — PROTAMINE SULFATE 10 MG/ML IV SOLN
INTRAVENOUS | Status: DC | PRN
  Administered 2014-11-14: 20 mg via INTRAVENOUS
  Administered 2014-11-14: 80 mg via INTRAVENOUS
  Administered 2014-11-14: 20 mg via INTRAVENOUS
  Administered 2014-11-14: 130 mg via INTRAVENOUS
  Administered 2014-11-14: 80 mg via INTRAVENOUS

## 2014-11-14 MED ORDER — ROCURONIUM BROMIDE 100 MG/10ML IV SOLN
INTRAVENOUS | Status: DC | PRN
  Administered 2014-11-14: 50 mg via INTRAVENOUS

## 2014-11-14 MED ORDER — BISACODYL 5 MG PO TBEC
10.0000 mg | DELAYED_RELEASE_TABLET | Freq: Every day | ORAL | Status: DC
  Administered 2014-11-15 – 2014-11-21 (×5): 10 mg via ORAL
  Filled 2014-11-14 (×5): qty 2

## 2014-11-14 MED ORDER — ACETAMINOPHEN 160 MG/5ML PO SOLN: 1000.0000 mg | Freq: Four times a day (QID) | ORAL | Status: DC

## 2014-11-14 MED ORDER — POTASSIUM CHLORIDE 10 MEQ/50ML IV SOLN: 10.0000 meq | INTRAVENOUS | Status: AC

## 2014-11-14 MED ORDER — DOCUSATE SODIUM 100 MG PO CAPS
200.0000 mg | ORAL_CAPSULE | Freq: Every day | ORAL | Status: DC
  Administered 2014-11-15 – 2014-11-23 (×7): 200 mg via ORAL
  Filled 2014-11-14 (×8): qty 2

## 2014-11-14 MED ORDER — SODIUM CHLORIDE 0.9 % IJ SOLN
3.0000 mL | Freq: Two times a day (BID) | INTRAMUSCULAR | Status: DC
  Administered 2014-11-15 – 2014-11-22 (×7): 3 mL via INTRAVENOUS

## 2014-11-14 MED ORDER — VECURONIUM BROMIDE 10 MG IV SOLR
INTRAVENOUS | Status: DC | PRN
  Administered 2014-11-14 (×4): 5 mg via INTRAVENOUS

## 2014-11-14 MED FILL — Sodium Chloride IV Soln 0.9%: INTRAVENOUS | Qty: 2000 | Status: AC

## 2014-11-14 MED FILL — Sodium Bicarbonate IV Soln 8.4%: INTRAVENOUS | Qty: 50 | Status: AC

## 2014-11-14 MED FILL — Lidocaine HCl IV Inj 20 MG/ML: INTRAVENOUS | Qty: 5 | Status: AC

## 2014-11-14 MED FILL — Electrolyte-R (PH 7.4) Solution: INTRAVENOUS | Qty: 3000 | Status: AC

## 2014-11-14 MED FILL — Heparin Sodium (Porcine) Inj 1000 Unit/ML: INTRAMUSCULAR | Qty: 10 | Status: AC

## 2014-11-14 MED FILL — Mannitol IV Soln 20%: INTRAVENOUS | Qty: 500 | Status: AC

## 2014-11-14 SURGICAL SUPPLY — 154 items
ADAPTER CARDIO PERF ANTE/RETRO (ADAPTER) ×3 IMPLANT
ADH SKN CLS APL DERMABOND .7 (GAUZE/BANDAGES/DRESSINGS) ×2
ADPR PRFSN 84XANTGRD RTRGD (ADAPTER) ×2
APL SKNCLS STERI-STRIP NONHPOA (GAUZE/BANDAGES/DRESSINGS)
APPLICATOR COTTON TIP 6IN STRL (MISCELLANEOUS) IMPLANT
ATRICLIP EXCLUSION 40 STD HAND (Clip) ×1 IMPLANT
BAG DECANTER FOR FLEXI CONT (MISCELLANEOUS) ×8 IMPLANT
BANDAGE ELASTIC 4 VELCRO ST LF (GAUZE/BANDAGES/DRESSINGS) ×3 IMPLANT
BANDAGE ELASTIC 6 VELCRO ST LF (GAUZE/BANDAGES/DRESSINGS) ×3 IMPLANT
BASKET HEART (ORDER IN 25'S) (MISCELLANEOUS) ×1
BASKET HEART (ORDER IN 25S) (MISCELLANEOUS) ×2 IMPLANT
BENZOIN TINCTURE PRP APPL 2/3 (GAUZE/BANDAGES/DRESSINGS) ×2 IMPLANT
BLADE STERNUM SYSTEM 6 (BLADE) ×6 IMPLANT
BLADE SURG 11 STRL SS (BLADE) ×2 IMPLANT
BLADE SURG ROTATE 9660 (MISCELLANEOUS) ×1 IMPLANT
BNDG GAUZE ELAST 4 BULKY (GAUZE/BANDAGES/DRESSINGS) ×3 IMPLANT
CANISTER SUCTION 2500CC (MISCELLANEOUS) ×7 IMPLANT
CANN PRFSN 3/8X14X24FR PCFC (MISCELLANEOUS)
CANN PRFSN 3/8XCNCT ST RT ANG (MISCELLANEOUS)
CANNULA EZ GLIDE AORTIC 21FR (CANNULA) ×7 IMPLANT
CANNULA FEM VENOUS REMOTE 22FR (CANNULA) IMPLANT
CANNULA GUNDRY RCSP 15FR (MISCELLANEOUS) ×1 IMPLANT
CANNULA OPTISITE PERFUSION 16F (CANNULA) IMPLANT
CANNULA OPTISITE PERFUSION 18F (CANNULA) IMPLANT
CANNULA PRFSN 3/8X14X24FR PCFC (MISCELLANEOUS) IMPLANT
CANNULA PRFSN 3/8XCNCT RT ANG (MISCELLANEOUS) IMPLANT
CANNULA VEN MTL TIP RT (MISCELLANEOUS)
CATH CPB KIT OWEN (MISCELLANEOUS) ×3 IMPLANT
CATH FOLEY 2WAY SLVR  5CC 14FR (CATHETERS)
CATH FOLEY 2WAY SLVR 5CC 14FR (CATHETERS) IMPLANT
CATH THORACIC 28FR RT ANG (CATHETERS) IMPLANT
CATH THORACIC 36FR (CATHETERS) ×3 IMPLANT
CLIP FOGARTY SPRING 6M (CLIP) IMPLANT
CLIP TI MEDIUM 24 (CLIP) IMPLANT
CLIP TI WIDE RED SMALL 24 (CLIP) IMPLANT
CONN 1/2X1/2X1/2  BEN (MISCELLANEOUS)
CONN 1/2X1/2X1/2 BEN (MISCELLANEOUS) ×2 IMPLANT
CONN 3/8X1/2 ST GISH (MISCELLANEOUS) ×6 IMPLANT
COVER MAYO STAND STRL (DRAPES) ×2 IMPLANT
COVER SURGICAL LIGHT HANDLE (MISCELLANEOUS) ×6 IMPLANT
CRADLE DONUT ADULT HEAD (MISCELLANEOUS) ×6 IMPLANT
DERMABOND ADVANCED (GAUZE/BANDAGES/DRESSINGS) ×1
DERMABOND ADVANCED .7 DNX12 (GAUZE/BANDAGES/DRESSINGS) ×4 IMPLANT
DEVICE TROCAR PUNCTURE CLOSURE (ENDOMECHANICALS) ×2 IMPLANT
DISSECTOR BLUNT TIP ENDO 5MM (MISCELLANEOUS) ×2 IMPLANT
DRAIN CHANNEL 28F RND 3/8 FF (WOUND CARE) ×4 IMPLANT
DRAIN CHANNEL 32F RND 10.7 FF (WOUND CARE) ×6 IMPLANT
DRAPE BILATERAL SPLIT (DRAPES) ×2 IMPLANT
DRAPE C-ARM 42X72 X-RAY (DRAPES) ×2 IMPLANT
DRAPE CARDIOVASCULAR INCISE (DRAPES) ×3
DRAPE CV SPLIT W-CLR ANES SCRN (DRAPES) ×2 IMPLANT
DRAPE INCISE IOBAN 66X45 STRL (DRAPES) ×7 IMPLANT
DRAPE SLUSH/WARMER DISC (DRAPES) ×3 IMPLANT
DRAPE SRG 135X102X78XABS (DRAPES) ×2 IMPLANT
DRSG COVADERM 4X14 (GAUZE/BANDAGES/DRESSINGS) ×5 IMPLANT
DRSG COVADERM 4X8 (GAUZE/BANDAGES/DRESSINGS) ×2 IMPLANT
ELECT BLADE 4.0 EZ CLEAN MEGAD (MISCELLANEOUS) ×3
ELECT BLADE 6.5 EXT (BLADE) ×2 IMPLANT
ELECT REM PT RETURN 9FT ADLT (ELECTROSURGICAL) ×6
ELECTRODE BLDE 4.0 EZ CLN MEGD (MISCELLANEOUS) IMPLANT
ELECTRODE REM PT RTRN 9FT ADLT (ELECTROSURGICAL) ×8 IMPLANT
GAUZE SPONGE 4X4 12PLY STRL (GAUZE/BANDAGES/DRESSINGS) ×10 IMPLANT
GAUZE SPONGE 4X4 16PLY XRAY LF (GAUZE/BANDAGES/DRESSINGS) ×1 IMPLANT
GLOVE BIO SURGEON STRL SZ 6 (GLOVE) ×4 IMPLANT
GLOVE BIO SURGEON STRL SZ 6.5 (GLOVE) ×3 IMPLANT
GLOVE BIO SURGEON STRL SZ7 (GLOVE) IMPLANT
GLOVE BIO SURGEON STRL SZ7.5 (GLOVE) IMPLANT
GLOVE ORTHO TXT STRL SZ7.5 (GLOVE) ×9 IMPLANT
GOWN STRL REUS W/ TWL LRG LVL3 (GOWN DISPOSABLE) ×16 IMPLANT
GOWN STRL REUS W/TWL LRG LVL3 (GOWN DISPOSABLE) ×21
HEMOSTAT POWDER SURGIFOAM 1G (HEMOSTASIS) ×12 IMPLANT
INSERT FOGARTY XLG (MISCELLANEOUS) ×6 IMPLANT
KIT BASIN OR (CUSTOM PROCEDURE TRAY) ×5 IMPLANT
KIT DILATOR VASC 18G NDL (KITS) ×2 IMPLANT
KIT ROOM TURNOVER OR (KITS) ×5 IMPLANT
KIT SUCTION CATH 14FR (SUCTIONS) ×15 IMPLANT
KIT VASOVIEW W/TROCAR VH 2000 (KITS) ×3 IMPLANT
LEAD PACING MYOCARDI (MISCELLANEOUS) ×3 IMPLANT
MARKER GRAFT CORONARY BYPASS (MISCELLANEOUS) ×9 IMPLANT
NS IRRIG 1000ML POUR BTL (IV SOLUTION) ×24 IMPLANT
PACK OPEN HEART (CUSTOM PROCEDURE TRAY) ×5 IMPLANT
PAD ARMBOARD 7.5X6 YLW CONV (MISCELLANEOUS) ×12 IMPLANT
PAD ELECT DEFIB RADIOL ZOLL (MISCELLANEOUS) ×3 IMPLANT
PENCIL BUTTON HOLSTER BLD 10FT (ELECTRODE) ×3 IMPLANT
PUNCH AORTIC ROTATE  4.5MM 8IN (MISCELLANEOUS) ×1 IMPLANT
PUNCH AORTIC ROTATE 4.0MM (MISCELLANEOUS) IMPLANT
PUNCH AORTIC ROTATE 4.5MM 8IN (MISCELLANEOUS) IMPLANT
PUNCH AORTIC ROTATE 5MM 8IN (MISCELLANEOUS) IMPLANT
RETRACTOR TRL SOFT TISSUE LG (INSTRUMENTS) IMPLANT
RETRACTOR TRM SOFT TISSUE 7.5 (INSTRUMENTS) IMPLANT
SCISSORS LAP 5X35 DISP (ENDOMECHANICALS) ×2 IMPLANT
SET IRRIG TUBING LAPAROSCOPIC (IRRIGATION / IRRIGATOR) ×3 IMPLANT
SOLUTION ANTI FOG 6CC (MISCELLANEOUS) ×2 IMPLANT
SPONGE GAUZE 4X4 12PLY STER LF (GAUZE/BANDAGES/DRESSINGS) ×1 IMPLANT
SPONGE LAP 18X18 X RAY DECT (DISPOSABLE) ×1 IMPLANT
SPONGE LAP 4X18 X RAY DECT (DISPOSABLE) ×1 IMPLANT
SUCKER INTRACARDIAC WEIGHTED (SUCKER) ×3 IMPLANT
SUT BONE WAX W31G (SUTURE) ×3 IMPLANT
SUT ETHIBOND 2 0 SH (SUTURE) IMPLANT
SUT ETHIBOND 2 0 SH 36X2 (SUTURE) ×4 IMPLANT
SUT ETHIBOND 2 0 V4 (SUTURE) IMPLANT
SUT ETHIBOND 2 0V4 GREEN (SUTURE) IMPLANT
SUT ETHIBOND 4 0 TF (SUTURE) IMPLANT
SUT ETHIBOND 5 0 C 1 30 (SUTURE) ×2 IMPLANT
SUT ETHIBOND X763 2 0 SH 1 (SUTURE) ×7 IMPLANT
SUT MNCRL AB 3-0 PS2 18 (SUTURE) ×6 IMPLANT
SUT MNCRL AB 4-0 PS2 18 (SUTURE) ×2 IMPLANT
SUT PDS AB 1 CTX 36 (SUTURE) ×6 IMPLANT
SUT PROLENE 2 0 SH DA (SUTURE) IMPLANT
SUT PROLENE 3 0 SH 1 (SUTURE) ×2 IMPLANT
SUT PROLENE 3 0 SH DA (SUTURE) ×4 IMPLANT
SUT PROLENE 3 0 SH1 36 (SUTURE) ×6 IMPLANT
SUT PROLENE 4 0 RB 1 (SUTURE) ×3
SUT PROLENE 4 0 SH DA (SUTURE) ×4 IMPLANT
SUT PROLENE 4-0 RB1 .5 CRCL 36 (SUTURE) ×4 IMPLANT
SUT PROLENE 5 0 C 1 36 (SUTURE) IMPLANT
SUT PROLENE 6 0 C 1 30 (SUTURE) IMPLANT
SUT PROLENE 7.0 RB 3 (SUTURE) ×9 IMPLANT
SUT PROLENE 8 0 BV175 6 (SUTURE) IMPLANT
SUT PROLENE BLUE 7 0 (SUTURE) ×4 IMPLANT
SUT PROLENE POLY MONO (SUTURE) ×2 IMPLANT
SUT SILK  1 MH (SUTURE) ×1
SUT SILK 1 MH (SUTURE) ×4 IMPLANT
SUT SILK 2 0SH CR/8 30 (SUTURE) ×2 IMPLANT
SUT SILK 3 0SH CR/8 30 (SUTURE) ×2 IMPLANT
SUT STEEL 6MS V (SUTURE) IMPLANT
SUT STEEL STERNAL CCS#1 18IN (SUTURE) IMPLANT
SUT STEEL SZ 6 DBL 3X14 BALL (SUTURE) ×3 IMPLANT
SUT VIC AB 1 CTX 36 (SUTURE)
SUT VIC AB 1 CTX36XBRD ANBCTR (SUTURE) IMPLANT
SUT VIC AB 2-0 CT1 27 (SUTURE) ×3
SUT VIC AB 2-0 CT1 TAPERPNT 27 (SUTURE) IMPLANT
SUT VIC AB 2-0 CTX 27 (SUTURE) ×6 IMPLANT
SUT VIC AB 2-0 UR6 27 (SUTURE) ×4 IMPLANT
SUT VIC AB 3-0 SH 27 (SUTURE)
SUT VIC AB 3-0 SH 27X BRD (SUTURE) IMPLANT
SUT VIC AB 3-0 SH 8-18 (SUTURE) ×6 IMPLANT
SUT VIC AB 3-0 X1 27 (SUTURE) ×1 IMPLANT
SUT VICRYL 2 TP 1 (SUTURE) ×2 IMPLANT
SUT VICRYL 4-0 PS2 18IN ABS (SUTURE) IMPLANT
SUTURE E-PAK OPEN HEART (SUTURE) ×3 IMPLANT
SYRINGE 10CC LL (SYRINGE) ×2 IMPLANT
SYSTEM SAHARA CHEST DRAIN ATS (WOUND CARE) ×8 IMPLANT
TAPE CLOTH SURG 4X10 WHT LF (GAUZE/BANDAGES/DRESSINGS) ×2 IMPLANT
TOWEL OR 17X24 6PK STRL BLUE (TOWEL DISPOSABLE) ×9 IMPLANT
TOWEL OR 17X26 10 PK STRL BLUE (TOWEL DISPOSABLE) ×9 IMPLANT
TRAY FOLEY IC TEMP SENS 14FR (CATHETERS) ×2 IMPLANT
TRAY FOLEY IC TEMP SENS 16FR (CATHETERS) ×5 IMPLANT
TROCAR XCEL BLADELESS 5X75MML (TROCAR) ×4 IMPLANT
TUBING INSUFFLATION (TUBING) ×3 IMPLANT
TUBING INSUFFLATION 10FT LAP (TUBING) ×2 IMPLANT
TUNNELER SHEATH ON-Q 11GX8 DSP (PAIN MANAGEMENT) IMPLANT
UNDERPAD 30X30 INCONTINENT (UNDERPADS AND DIAPERS) ×5 IMPLANT
WATER STERILE IRR 1000ML POUR (IV SOLUTION) ×10 IMPLANT

## 2014-11-14 NOTE — Anesthesia Procedure Notes (Signed)
Procedure Name: Intubation Date/Time: 11/14/2014 7:58 AM Performed by: Clearnce Sorrel Pre-anesthesia Checklist: Patient identified, Timeout performed, Emergency Drugs available, Suction available and Patient being monitored Patient Re-evaluated:Patient Re-evaluated prior to inductionOxygen Delivery Method: Circle system utilized Preoxygenation: Pre-oxygenation with 100% oxygen Intubation Type: IV induction Ventilation: Oral airway inserted - appropriate to patient size and Mask ventilation without difficulty Laryngoscope Size: Mac and 3 Grade View: Grade I Tube type: Subglottic suction tube Tube size: 8.0 mm Number of attempts: 1 Placement Confirmation: ETT inserted through vocal cords under direct vision,  breath sounds checked- equal and bilateral and positive ETCO2 Secured at: 23 cm Tube secured with: Tape Dental Injury: Teeth and Oropharynx as per pre-operative assessment

## 2014-11-14 NOTE — Progress Notes (Signed)
  Echocardiogram Echocardiogram Transesophageal has been performed.  Chanteria Haggard 11/14/2014, 9:01 AM

## 2014-11-14 NOTE — Progress Notes (Signed)
[facility] BRIEF SICU PROGRESS NOTE  Day of Surgery  S/P Procedure(s) (LRB): CORONARY ARTERY BYPASS GRAFTING (CABG)TIMES 2 USING LEFT INTERNAL MAMMARY ARTERY AND RIGHT SAPHENOUS VEIN HARVESTED ENDOSCOPICALLY (N/A) CLIPPING OF ATRIAL APPENDAGE (N/A) TRANSESOPHAGEAL ECHOCARDIOGRAM (TEE) (N/A)   Extubated uneventfully Sleepy but neuro grossly intact NSR - AAI paced w/ stable hemodynamics off all drips O2 sats 92-95% Chest tube output low UOP adequate Labs okay  Plan: Continue routine early postop  [name] [name] 11/14/2014 8:20 PM

## 2014-11-14 NOTE — Op Note (Signed)
CARDIOTHORACIC SURGERY OPERATIVE NOTE  Date of Procedure: 11/14/2014  Preoperative Diagnosis:   Severe Left Main Coronary Artery Disease  New-onset Paroxysmal Atrial Fibrillation  Postoperative Diagnosis: Same  Procedure:    Coronary Artery Bypass Grafting x 2   Left Internal Mammary Artery to Distal Left Anterior Descending Coronary Artery  Saphenous Vein Graft to Obtuse Marginal Branch of Left Circumflex Coronary Artery  Endoscopic Vein Harvest from Right Thigh   Clipping of Left Atrial Appendage  Atricure Atriclip left atrial occluder, size 75mm  Surgeon: Valentina Gu. Roxy Manns, MD  Assistant: John Giovanni, PA-C  Anesthesia: Midge Minium, MD  Operative Findings:  Mild LV systolic dysfunction with EF estimated 45-50%  Mild aortic valve sclerosis without stenosis and mild aortic insufficiency  Mild mitral regurgitation  Good quality LIMA conduit for grafting  Good quality SVG conduit for grafting  Good quality target vessels for grafting    BRIEF CLINICAL NOTE AND INDICATIONS FOR SURGERY  Patient is a 69 year old obese African-American female with no previous cardiac history and risk factors notable primarily for history of hypertension and tobacco use who was admitted to the hospital 10/29/2014 with a several week history of progressive symptoms of symptoms suggestive of upper respiratory tract infection, palpitations, worsening shortness of breath, mild sub-sternal chest tightness and fatigue. The patient initially went to her primary care physician's office where she was noted to be in atrial fibrillation with rapid ventricular response. She was sent to the emergency department where she was noted to be in atrial fibrillation with heart rate in the 622W and systolic blood pressure ranging between 120 and 1 80 mmHg. Initial troponin was negative. Chest x-ray was felt to be concerning for possible left lower lobe pneumonia. [name] atrial fibrillation was  treated with diltiazem for rate control and the patient was started on antibiotics for possible community-acquired pneumonia. A transthoracic echocardiogram was performed demonstrating moderate left ventricular systolic dysfunction with ejection fraction estimated 35-40%, diffuse global hypokinesis, and severe hypokinesis of the inferior wall. There was also reportedly severe left atrial enlargement and moderate mitral regurgitation. Serial troponin levels were flat and very mildly elevated, measuring 0.07 - 0.07 - 0.07. Diagnostic cardiac catheterization was recommended on 10/31/2014 but the patient became very anxious, hypertensive and short of breath and subsequently refused to consent for catheterization. Although she never received any IV contrast, following this event the patient apparently reported a history of possible IV contrast allergy. After further consultation the patient eventually agreed to consent to catheterization, and she was brought to the Cath Lab on 11/03/2014. However, prior to undergoing catheterization the patient again developed sudden onset severe agitation and combativeness associated with shortness of breath. She then suffered a sudden cardiac arrest associated with asystole and without any associated preceding signs of chest pain, chest tightness, VT, VF, or other arrhythmia. She was promptly resuscitated by the cath lab team and code team, and her code time was only 9 minute total duration. By report she was noted to have signs of decerebrate posturing immediately following the code. She was evaluated by the pulmonary critical care team and started on high-dose sedation for cooling protocol. She remained in atrial fibrillation and developed some bradycardia during cooling. A decision was made to take the patient to cardiac catheterization earlier today by Dr. Claiborne Billings. Catheterization demonstrates 80% distal left main coronary artery stenosis with moderate diffuse left  ventricular systolic dysfunction, ejection fraction estimated 35-40%. Right heart catheterization was not performed. Cardiothoracic surgical consultation was requested.  The  patient recovered from her cardiac arrest and was successfully extubated.  She gradually recovered without further sequelae.  She remained in sinus rhythm with exception of a couple of episodes of PAF for which she was ultimately treated with amiodarone.  The patient has been seen in consultation and counseled at length regarding the indications, risks and potential benefits of surgery.  All questions have been answered, and the patient provides full informed consent for the operation as described.      DETAILS OF THE OPERATIVE PROCEDURE  Preparation:  The patient is brought to the operating room on the above mentioned date and central monitoring was established by the anesthesia team including placement of Swan-Ganz catheter and radial arterial line. The patient is placed in the supine position on the operating table.  Intravenous antibiotics are administered. General endotracheal anesthesia is induced uneventfully. A Foley catheter is placed.  Baseline transesophageal echocardiogram was performed.  Findings were notable for mild LV systolic dysfunction with EF estimated 45-50%.  There was mild aortic valve sclerosis without aortic stenosis and with mild aortic insufficiency.  There was mild mitral regurgitation.  [name] chest, abdomen, both groins, and both lower extremities are prepared and draped in a sterile manner. A time out procedure is performed.   Surgical Approach and Conduit Harvest:  A median sternotomy incision was performed and the left internal mammary artery is dissected from the chest wall and prepared for bypass grafting. The left internal mammary artery is notably good quality conduit. Simultaneously, the greater saphenous vein is obtained from the patient's right thigh using endoscopic vein harvest  technique. The saphenous vein is notably good quality conduit. After removal of the saphenous vein, the small surgical incisions in the lower extremity are closed with absorbable suture. Following systemic heparinization, the left internal mammary artery was transected distally noted to have excellent flow.   Extracorporeal Cardiopulmonary Bypass and Myocardial Protection:  The pericardium is opened. The ascending aorta is moderately sclerotic in appearance. The ascending aorta and the right atrium are cannulated for cardiopulmonary bypass.  Adequate heparinization is verified.  The entire pre-bypass portion of the operation was notable for stable hemodynamics.  Cardiopulmonary bypass was begun and the surface of the heart is inspected. Distal target vessels are selected for coronary artery bypass grafting. A cardioplegia cannula is placed in the ascending aorta.  A temperature probe was placed in the interventricular septum.  The patient is allowed to cool passively to [facility] systemic temperature.  The aortic cross clamp is applied and cold blood cardioplegia is delivered initially in an antegrade fashion through the aortic root.  Iced saline slush is applied for topical hypothermia.  The initial cardioplegic arrest is rapid with early diastolic arrest.  Repeat doses of cardioplegia are administered intermittently throughout the entire cross clamp portion of the operation through the aortic root and through subsequently placed vein grafts in order to maintain completely flat electrocardiogram and septal myocardial temperature below 15C.  Myocardial protection was felt to be excellent.  Coronary Artery Bypass Grafting:   The obtuse marginal branch of the left circumflex coronary artery was grafted using a reversed saphenous vein graft in an end-to-side fashion.  At the site of distal anastomosis the target vessel was good quality and measured approximately 2.0 mm in diameter.  The distal left anterior  coronary artery was grafted with the left internal mammary artery in an end-to-side fashion.  At the site of distal anastomosis the target vessel was good quality and measured approximately 2.0 mm  in diameter.   Clipping of Left Atrial Appendage:  The left atrial appendage was clipped using an Atricure Atriclip left atrial occluder, size 40 mm.   Procedure Completion:  All proximal vein graft anastomoses were placed directly to the ascending aorta prior to removal of the aortic cross clamp.  The septal myocardial temperature rose rapidly after reperfusion of the left internal mammary artery graft.  The aortic cross clamp was removed after a total cross clamp time of 48 minutes.  All proximal and distal coronary anastomoses were inspected for hemostasis and appropriate graft orientation. Epicardial pacing wires are fixed to the right ventricular outflow tract and to the right atrial appendage. The patient is rewarmed to 37C temperature. The patient is weaned and disconnected from cardiopulmonary bypass.  [name] rhythm at separation from bypass was sinus bradycardia.  The patient was weaned from cardiopulmonary bypass without any inotropic support. Total cardiopulmonary bypass time for the operation was 87 minutes.  Followup transesophageal echocardiogram performed after separation from bypass revealed no changes from the preoperative exam.  The aortic and venous cannula were removed uneventfully. Protamine was administered to reverse the anticoagulation. The mediastinum and pleural space were inspected for hemostasis and irrigated with saline solution. The mediastinum and the left pleural space were drained using 3 chest tubes placed through separate stab incisions inferiorly.  The soft tissues anterior to the aorta were reapproximated loosely. The sternum is closed with double strength sternal wire. The soft tissues anterior to the sternum were closed in multiple layers and the skin is closed  with a running subcuticular skin closure.  The post-bypass portion of the operation was notable for stable rhythm and hemodynamics.  No blood products were administered during the operation.   Disposition:  The patient tolerated the procedure well and is transported to the surgical intensive care in stable condition. There are no intraoperative complications. All sponge instrument and needle counts are verified correct at completion of the operation.    Valentina Gu. Roxy Manns MD 11/14/2014 12:36 PM

## 2014-11-14 NOTE — Anesthesia Postprocedure Evaluation (Signed)
  Anesthesia Post-op Note  Patient: [name] [name]  Procedure(s) Performed: Procedure(s): CORONARY ARTERY BYPASS GRAFTING (CABG)TIMES 2 USING LEFT INTERNAL MAMMARY ARTERY AND RIGHT SAPHENOUS VEIN HARVESTED ENDOSCOPICALLY (N/A) CLIPPING OF ATRIAL APPENDAGE (N/A) TRANSESOPHAGEAL ECHOCARDIOGRAM (TEE) (N/A)  Patient Location: SICU  Anesthesia Type:General  Level of Consciousness: sedated and Patient remains intubated per anesthesia plan  Airway and Oxygen Therapy: Patient Spontanous Breathing, Patient remains intubated per anesthesia plan and Patient placed on Ventilator (see vital sign flow sheet for setting)  Post-op Pain: none  Post-op Assessment: Post-op Vital signs reviewed, Patient's Cardiovascular Status Stable, Respiratory Function Stable, Patent Airway, No signs of Nausea or vomiting and Pain level controlled, weaning vent now  Post-op Vital Signs: Reviewed and stable  Last Vitals:  Filed Vitals:   11/14/14 1700  BP: 104/60  Pulse: 79  Temp: 36.6 C  Resp: 20    Complications: No apparent anesthesia complications

## 2014-11-14 NOTE — Anesthesia Preprocedure Evaluation (Addendum)
Anesthesia Evaluation  Patient identified by MRN, date of birth, ID band Patient awake    Reviewed: Allergy & Precautions, NPO status , Patient's Chart, lab work & pertinent test results, reviewed documented beta blocker date and time   History of Anesthesia Complications Negative for: history of anesthetic complications  Airway Mallampati: II  TM Distance: >3 FB Neck ROM: Full    Dental  (+) Edentulous Lower, Edentulous Upper   Pulmonary COPDCurrent Smoker,  breath sounds clear to auscultation        Cardiovascular hypertension, Pt. on medications and Pt. on home beta blockers + angina + CAD and + Past MI + dysrhythmias (PEA/asystole with MI, temp pacemaker required) Atrial Fibrillation Rhythm:Regular Rate:Normal  11/12/14 ECHO: EF 45%, diffuse hypokinesis, mild Aortic calcification, mild mitral calcification   Neuro/Psych negative neurological ROS     GI/Hepatic negative GI ROS, Elevated LFTs with cardiac arrest   Endo/Other  Morbid obesity  Renal/GU Renal InsufficiencyRenal disease (creat 1.20)     Musculoskeletal   Abdominal (+) + obese,  Abdomen: soft.    Peds  Hematology   Anesthesia Other Findings   Reproductive/Obstetrics                          Anesthesia Physical Anesthesia Plan  ASA: IV  Anesthesia Plan: General   Post-op Pain Management:    Induction: Intravenous  Airway Management Planned: Oral ETT  Additional Equipment: Arterial line, CVP, PA Cath, 3D TEE and Ultrasound Guidance Line Placement  Intra-op Plan:   Post-operative Plan: Post-operative intubation/ventilation  Informed Consent: I have reviewed the patients History and Physical, chart, labs and discussed the procedure including the risks, benefits and alternatives for the proposed anesthesia with the patient or authorized representative who has indicated his/her understanding and acceptance.     Plan  Discussed with: Anesthesiologist, Surgeon and CRNA  Anesthesia Plan Comments: (Plan routine monitors, A line, PA cath, GETA with TEE and post op ventilation)       Anesthesia Quick Evaluation

## 2014-11-14 NOTE — Procedures (Signed)
Extubation Procedure Note  Patient Details:   Name: [name] [name] DOB: 07/15/1946 MRN: [id number]   Airway Documentation:  Airway 8 mm (Active)  Secured at (cm) 23 cm 11/14/2014  1:13 PM  Measured From Lips 11/14/2014  1:13 PM  Secured Location Right 11/14/2014  1:13 PM  Secured By Pink Tape 11/14/2014  1:13 PM  Site Condition Dry 11/14/2014  1:13 PM   Extubated to 4lpm [place], nif -20, vc 8L, incentive 250cc's no distress at this time. Evaluation  O2 sats: stable throughout Complications: No apparent complications Patient did tolerate procedure well. Bilateral Breath Sounds: Clear, Diminished Suctioning: Airway Yes  Donella Stade 11/14/2014, 6:09 PM

## 2014-11-14 NOTE — Brief Op Note (Addendum)
10/29/2014 - 11/14/2014       [address]       [place] [address]             [phone]     10/29/2014 - 11/14/2014  11:00 AM  PATIENT:  [name] [name]  69 y.o. female  PRE-OPERATIVE DIAGNOSIS:  CAD  POST-OPERATIVE DIAGNOSIS:  CAD  PROCEDURE:  Procedure(s): CORONARY ARTERY BYPASS GRAFTING (CABG)X 2 LIMA-LAD; SVG-OM CLIPPING OF LEFT ATRIAL APPENDAGE TRANSESOPHAGEAL ECHOCARDIOGRAM (TEE) EVH RIGHT THIGH  SURGEON:    [name], MD  ASSISTANTS:  John Giovanni, PA-C  ANESTHESIA:   Annye Asa, MD  CROSSCLAMP TIME:   59'  CARDIOPULMONARY BYPASS TIME: 96'  FINDINGS:  Mild LV systolic dysfunction with EF estimated 45-50%  Mild aortic valve sclerosis without stenosis and mild aortic insufficiency  Mild mitral regurgitation  Good quality LIMA conduit for grafting  Good quality SVG conduit for grafting  Good quality target vessels for grafting  COMPLICATIONS: None  BASELINE WEIGHT: 93 kg  PATIENT DISPOSITION:   TO SICU IN STABLE CONDITION  [name] 11/14/2014 12:31 PM

## 2014-11-14 NOTE — Transfer of Care (Signed)
Immediate Anesthesia Transfer of Care Note  Patient: [name] [name]  Procedure(s) Performed: Procedure(s): CORONARY ARTERY BYPASS GRAFTING (CABG)TIMES 2 USING LEFT INTERNAL MAMMARY ARTERY AND RIGHT SAPHENOUS VEIN HARVESTED ENDOSCOPICALLY (N/A) CLIPPING OF ATRIAL APPENDAGE (N/A) TRANSESOPHAGEAL ECHOCARDIOGRAM (TEE) (N/A)  Patient Location: SICU  Anesthesia Type:General  Level of Consciousness: Patient remains intubated per anesthesia plan  Airway & Oxygen Therapy: Patient remains intubated per anesthesia plan  Post-op Assessment: Report given to RN and Post -op Vital signs reviewed and stable  Post vital signs: Reviewed and stable  Last Vitals:  Filed Vitals:   11/14/14 0320  BP: 144/56  Pulse:   Temp: 36.8 C  Resp: 19    Complications: No apparent anesthesia complications

## 2014-11-15 ENCOUNTER — Inpatient Hospital Stay (HOSPITAL_COMMUNITY): Payer: Commercial Managed Care - HMO

## 2014-11-15 ENCOUNTER — Encounter (HOSPITAL_COMMUNITY): Payer: Self-pay | Admitting: Thoracic Surgery (Cardiothoracic Vascular Surgery)

## 2014-11-15 LAB — CREATININE, SERUM
Creatinine, Ser: 0.65 mg/dL (ref 0.50–1.10)
GFR calc Af Amer: >90 mL/min (ref >90)
GFR calc non Af Amer: 89 mL/min — ABNORMAL LOW (ref >90)

## 2014-11-15 LAB — POCT I-STAT, CHEM 8
BUN: 13 mg/dL (ref 6–23)
Calcium, Ion: 1.12 mmol/L — ABNORMAL LOW (ref 1.13–1.30)
Chloride: 110 mmol/L (ref 96–112)
Creatinine, Ser: 1.2 mg/dL — ABNORMAL HIGH (ref 0.50–1.10)
Glucose, Bld: 138 mg/dL — ABNORMAL HIGH (ref 70–99)
HCT: 28 % — ABNORMAL LOW (ref 36.0–46.0)
Hemoglobin: 9.5 g/dL — ABNORMAL LOW (ref 12.0–15.0)
Potassium: 3.9 mmol/L (ref 3.5–5.1)
Sodium: 144 mmol/L (ref 135–145)
TCO2: 15 mmol/L (ref 0–100)

## 2014-11-15 LAB — GLUCOSE, CAPILLARY
Glucose-Capillary: 106 mg/dL — ABNORMAL HIGH (ref 70–99)
Glucose-Capillary: 122 mg/dL — ABNORMAL HIGH (ref 70–99)
Glucose-Capillary: 124 mg/dL — ABNORMAL HIGH (ref 70–99)
Glucose-Capillary: 135 mg/dL — ABNORMAL HIGH (ref 70–99)
Glucose-Capillary: 141 mg/dL — ABNORMAL HIGH (ref 70–99)
Glucose-Capillary: 157 mg/dL — ABNORMAL HIGH (ref 70–99)

## 2014-11-15 LAB — MAGNESIUM
Magnesium: 2.2 mg/dL (ref 1.5–2.5)
Magnesium: 1 mg/dL — ABNORMAL LOW (ref 1.5–2.5)

## 2014-11-15 LAB — CBC
HCT: 30.6 % — ABNORMAL LOW (ref 36.0–46.0)
HCT: 31.8 % — ABNORMAL LOW (ref 36.0–46.0)
Hemoglobin: 9.9 g/dL — ABNORMAL LOW (ref 12.0–15.0)
Hemoglobin: 10.3 g/dL — ABNORMAL LOW (ref 12.0–15.0)
MCH: 29.1 pg (ref 26.0–34.0)
MCH: 29.5 pg (ref 26.0–34.0)
MCHC: 32.4 g/dL (ref 30.0–36.0)
MCHC: 32.4 g/dL (ref 30.0–36.0)
MCV: 90 fL (ref 78.0–100.0)
MCV: 91.1 fL (ref 78.0–100.0)
Platelets: 98 10*3/uL — ABNORMAL LOW (ref 150–400)
Platelets: 113 10*3/uL — ABNORMAL LOW (ref 150–400)
RBC: 3.4 MIL/uL — ABNORMAL LOW (ref 3.87–5.11)
RBC: 3.49 MIL/uL — ABNORMAL LOW (ref 3.87–5.11)
RDW: 16.6 % — ABNORMAL HIGH (ref 11.5–15.5)
RDW: 16.7 % — ABNORMAL HIGH (ref 11.5–15.5)
WBC: 19.8 10*3/uL — ABNORMAL HIGH (ref 4.0–10.5)
WBC: 24.1 10*3/uL — ABNORMAL HIGH (ref 4.0–10.5)

## 2014-11-15 LAB — BASIC METABOLIC PANEL
Anion gap: 5 (ref 5–15)
BUN: 12 mg/dL (ref 6–23)
CO2: 21 mmol/L (ref 19–32)
Calcium: 8.2 mg/dL — ABNORMAL LOW (ref 8.4–10.5)
Chloride: 114 mmol/L — ABNORMAL HIGH (ref 96–112)
Creatinine, Ser: 1.35 mg/dL — ABNORMAL HIGH (ref 0.50–1.10)
GFR calc Af Amer: 45 mL/min — ABNORMAL LOW (ref >90)
GFR calc non Af Amer: 39 mL/min — ABNORMAL LOW (ref >90)
Glucose, Bld: 130 mg/dL — ABNORMAL HIGH (ref 70–99)
Potassium: 4.7 mmol/L (ref 3.5–5.1)
Sodium: 140 mmol/L (ref 135–145)

## 2014-11-15 MED ORDER — DILTIAZEM HCL 100 MG IV SOLR
5.0000 mg/h | INTRAVENOUS | Status: DC
  Administered 2014-11-15: 5 mg/h via INTRAVENOUS
  Administered 2014-11-16 (×2): 15 mg/h via INTRAVENOUS
  Filled 2014-11-15 (×3): qty 100

## 2014-11-15 MED ORDER — METOPROLOL TARTRATE 25 MG PO TABS
25.0000 mg | ORAL_TABLET | Freq: Two times a day (BID) | ORAL | Status: DC
  Administered 2014-11-15 (×2): 25 mg via ORAL
  Filled 2014-11-15 (×4): qty 1

## 2014-11-15 MED ORDER — MORPHINE SULFATE 2 MG/ML IJ SOLN: 2.0000 mg | INTRAMUSCULAR | Status: DC | PRN

## 2014-11-15 MED ORDER — AMIODARONE LOAD VIA INFUSION
150.0000 mg | Freq: Once | INTRAVENOUS | Status: AC
  Administered 2014-11-15: 150 mg via INTRAVENOUS
  Filled 2014-11-15: qty 83.34

## 2014-11-15 MED ORDER — AMIODARONE HCL IN DEXTROSE 360-4.14 MG/200ML-% IV SOLN
30.0000 mg/h | INTRAVENOUS | Status: DC
  Administered 2014-11-16 – 2014-11-17 (×4): 30 mg/h via INTRAVENOUS
  Filled 2014-11-15 (×14): qty 200

## 2014-11-15 MED ORDER — FUROSEMIDE 10 MG/ML IJ SOLN
20.0000 mg | Freq: Four times a day (QID) | INTRAMUSCULAR | Status: AC
  Administered 2014-11-15 (×3): 20 mg via INTRAVENOUS
  Filled 2014-11-15 (×3): qty 2

## 2014-11-15 MED ORDER — MIDAZOLAM HCL 2 MG/2ML IJ SOLN: 1.0000 mg | INTRAMUSCULAR | Status: DC | PRN

## 2014-11-15 MED ORDER — POTASSIUM CHLORIDE 10 MEQ/50ML IV SOLN
10.0000 meq | INTRAVENOUS | Status: AC
  Administered 2014-11-15 (×2): 10 meq via INTRAVENOUS
  Filled 2014-11-15 (×2): qty 50

## 2014-11-15 MED ORDER — AMIODARONE HCL IN DEXTROSE 360-4.14 MG/200ML-% IV SOLN
60.0000 mg/h | INTRAVENOUS | Status: AC
  Administered 2014-11-15 (×2): 60 mg/h via INTRAVENOUS
  Filled 2014-11-15 (×2): qty 200

## 2014-11-15 NOTE — Progress Notes (Signed)
[facility] BRIEF SICU PROGRESS NOTE  1 Day Post-Op  S/P Procedure(s) (LRB): CORONARY ARTERY BYPASS GRAFTING (CABG)TIMES 2 USING LEFT INTERNAL MAMMARY ARTERY AND RIGHT SAPHENOUS VEIN HARVESTED ENDOSCOPICALLY (N/A) CLIPPING OF ATRIAL APPENDAGE (N/A) TRANSESOPHAGEAL ECHOCARDIOGRAM (TEE) (N/A)   Stable day but went into rapid Afib approx 10:30am Currently remains in Afib w/ HR 130 despite having received bolus + infusion IV amiodarone Otherwise doing fairly well  Plan: Will add Cardizem drip  [name] 11/15/2014 6:50 PM

## 2014-11-15 NOTE — Progress Notes (Addendum)
[address]       [place] [address]             [phone]        CARDIOTHORACIC SURGERY PROGRESS NOTE   R1 Day Post-Op Procedure(s) (LRB): CORONARY ARTERY BYPASS GRAFTING (CABG)TIMES 2 USING LEFT INTERNAL MAMMARY ARTERY AND RIGHT SAPHENOUS VEIN HARVESTED ENDOSCOPICALLY (N/A) CLIPPING OF ATRIAL APPENDAGE (N/A) TRANSESOPHAGEAL ECHOCARDIOGRAM (TEE) (N/A)  Subjective: Looks good.   Mild soreness in chest.  Denies SOB  Objective: Vital signs: BP Readings from Last 1 Encounters:  11/15/14 126/64   Pulse Readings from Last 1 Encounters:  11/15/14 79   Resp Readings from Last 1 Encounters:  11/15/14 25   Temp Readings from Last 1 Encounters:  11/15/14 99.5 F (37.5 C) Core (Comment)    Hemodynamics: PAP: (33-62)/(15-27) 48/23 mmHg CO:  [3 L/min-5.6 L/min] 4 L/min CI:  [1.5 L/min/m2-2.7 L/min/m2] 2 L/min/m2  Physical Exam:  Rhythm:   Sinus w/ frequent PVC's  Breath sounds: clear  Heart sounds:  RRR  Incisions:  Dressing dry, intact  Abdomen:  Soft, non-distended, non-tender  Extremities:  Warm, well-perfused  Chest tubes:  Low volume thin serosanguinous output but still draining some, no air leak    Intake/Output from previous day: 04/01 0701 - 04/02 0700 In: 7623.5 [P.O.:210; I.V.:6313.5; Blood:200; IV [id number]:492] Out: 2325 [Urine:1365; Blood:400; Chest Tube:560] Intake/Output this shift: Total I/O In: 110 [I.V.:60; IV Piggyback:50] Out: 140 [Urine:90; Chest Tube:50]  Lab Results:  CBC: Recent Labs  11/14/14 1850 11/15/14 0357  WBC 18.3* 19.8*  HGB 10.8* 9.9*  HCT 32.8* 30.6*  PLT 93* 98*    BMET:  Recent Labs  11/14/14 0329  11/14/14 1844 11/14/14 1850 11/15/14 0357  NA 141  < > 141  --  140  K 3.5  < > 4.3  --  4.7  CL 112  < > 111  --  114*  CO2 25  --   --   --  21  GLUCOSE 108*  < > 121*  --  130*  BUN 12  < > 10  --  12  CREATININE 1.20*  < > 1.00 1.09 1.35*  CALCIUM 9.3  --   --   --  8.2*  < > = values  in this interval not displayed.   CBG (last 3)   Recent Labs  11/14/14 2006 11/15/14 0006 11/15/14 0348  GLUCAP 109* 106* 124*    ABG    Component Value Date/Time   PHART 7.328* 11/14/2014 1910   PCO2ART 34.0* 11/14/2014 1910   PO2ART 68.0* 11/14/2014 1910   HCO3 17.9* 11/14/2014 1910   TCO2 19 11/14/2014 1910   ACIDBASEDEF 7.0* 11/14/2014 1910   O2SAT 92.0 11/14/2014 1910    CXR: PORTABLE CHEST - 1 VIEW  COMPARISON: 11/14/2014  FINDINGS: There is been subtle clearing at the right lung base. There is persistent left basilar opacity. Lung base opacities likely combination of small effusions and atelectasis.  No overt pulmonary edema. No pneumothorax.  Endotracheal tube and nasogastric tube have been removed since the prior exam. Left internal jugular Swan-Ganz catheter, left chest tube and mediastinal tube are stable.  No mediastinal widening. Cardiac silhouette is enlarged but stable.  IMPRESSION: 1. Status post extubation and removal of the nasogastric tube. 2. Mild clearing at the right lung base likely due to improved atelectasis. There is still persistent basilar atelectasis bilaterally with associated small effusions. No pulmonary edema. No pneumothorax.   Electronically  Signed  By: Lajean Manes M.D.  On: 11/15/2014 07:54   Assessment/Plan: S/P Procedure(s) (LRB): CORONARY ARTERY BYPASS GRAFTING (CABG)TIMES 2 USING LEFT INTERNAL MAMMARY ARTERY AND RIGHT SAPHENOUS VEIN HARVESTED ENDOSCOPICALLY (N/A) CLIPPING OF ATRIAL APPENDAGE (N/A) TRANSESOPHAGEAL ECHOCARDIOGRAM (TEE) (N/A)  Overall doing quite well POD1 Maintaining NSR w/ stable hemodynamics, no drips Expected post op acute blood loss anemia, stable Expected post op atelectasis, mild Expected post op volume excess, mild History of new-onset PAF pre-op Hypertension Obesity Long standing tobacco abuse COPD   Mobilize  Diuresis  Increase metoprolol  D/C lines  Leave  chest tubes in for now  Pulm toilet and nebs  Restart amiodarone   [name] 11/15/2014 9:22 AM

## 2014-11-15 NOTE — Progress Notes (Signed)
  Amiodarone Drug - Drug Interaction Consult Note  Recommendations: Will need to watch for increase risk of side effects of statins - increased lft's and myalgias. Low drug interaction risk for atorvastatin.  Amiodarone is metabolized by the cytochrome P450 system and therefore has the potential to cause many drug interactions. Amiodarone has an average plasma half-life of 50 days (range 20 to 100 days).   There is potential for drug interactions to occur several weeks or months after stopping treatment and the onset of drug interactions may be slow after initiating amiodarone.   [x]  Statins: Increased risk of myopathy. Simvastatin- restrict dose to 20mg  daily. Other statins: counsel patients to report any muscle pain or weakness immediately.  []  Anticoagulants: Amiodarone can increase anticoagulant effect. Consider warfarin dose reduction. Patients should be monitored closely and the dose of anticoagulant altered accordingly, remembering that amiodarone levels take several weeks to stabilize.  []  Antiepileptics: Amiodarone can increase plasma concentration of phenytoin, the dose should be reduced. Note that small changes in phenytoin dose can result in large changes in levels. Monitor patient and counsel on signs of toxicity.  []  Beta blockers: increased risk of bradycardia, AV block and myocardial depression. Sotalol - avoid concomitant use.  []   Calcium channel blockers (diltiazem and verapamil): increased risk of bradycardia, AV block and myocardial depression.  []   Cyclosporine: Amiodarone increases levels of cyclosporine. Reduced dose of cyclosporine is recommended.  []  Digoxin dose should be halved when amiodarone is started.  []  Diuretics: increased risk of cardiotoxicity if hypokalemia occurs.  []  Oral hypoglycemic agents (glyburide, glipizide, glimepiride): increased risk of hypoglycemia. Patient's glucose levels should be monitored closely when initiating amiodarone therapy.    []  Drugs that prolong the QT interval:  Torsades de pointes risk may be increased with concurrent use - avoid if possible.  Monitor QTc, also keep magnesium/potassium WNL if concurrent therapy can't be avoided. Marland Kitchen Antibiotics: e.g. fluoroquinolones, erythromycin. . Antiarrhythmics: e.g. quinidine, procainamide, disopyramide, sotalol. . Antipsychotics: e.g. phenothiazines, haloperidol.  . Lithium, tricyclic antidepressants, and methadone.  Thank You,  Georgina Peer  11/15/2014 11:18 AM

## 2014-11-16 ENCOUNTER — Inpatient Hospital Stay (HOSPITAL_COMMUNITY): Payer: Commercial Managed Care - HMO

## 2014-11-16 LAB — GLUCOSE, CAPILLARY
Glucose-Capillary: 122 mg/dL — ABNORMAL HIGH (ref 70–99)
Glucose-Capillary: 124 mg/dL — ABNORMAL HIGH (ref 70–99)
Glucose-Capillary: 130 mg/dL — ABNORMAL HIGH (ref 70–99)
Glucose-Capillary: 134 mg/dL — ABNORMAL HIGH (ref 70–99)
Glucose-Capillary: 137 mg/dL — ABNORMAL HIGH (ref 70–99)
Glucose-Capillary: 145 mg/dL — ABNORMAL HIGH (ref 70–99)

## 2014-11-16 LAB — BASIC METABOLIC PANEL
Anion gap: 7 (ref 5–15)
BUN: 16 mg/dL (ref 6–23)
CO2: 23 mmol/L (ref 19–32)
Calcium: 8.7 mg/dL (ref 8.4–10.5)
Chloride: 108 mmol/L (ref 96–112)
Creatinine, Ser: 1.34 mg/dL — ABNORMAL HIGH (ref 0.50–1.10)
GFR calc Af Amer: 46 mL/min — ABNORMAL LOW (ref >90)
GFR calc non Af Amer: 39 mL/min — ABNORMAL LOW (ref >90)
Glucose, Bld: 142 mg/dL — ABNORMAL HIGH (ref 70–99)
Potassium: 4.2 mmol/L (ref 3.5–5.1)
Sodium: 138 mmol/L (ref 135–145)

## 2014-11-16 LAB — CBC
HCT: 30.2 % — ABNORMAL LOW (ref 36.0–46.0)
Hemoglobin: 9.8 g/dL — ABNORMAL LOW (ref 12.0–15.0)
MCH: 29.6 pg (ref 26.0–34.0)
MCHC: 32.5 g/dL (ref 30.0–36.0)
MCV: 91.2 fL (ref 78.0–100.0)
Platelets: 121 10*3/uL — ABNORMAL LOW (ref 150–400)
RBC: 3.31 MIL/uL — ABNORMAL LOW (ref 3.87–5.11)
RDW: 16.8 % — ABNORMAL HIGH (ref 11.5–15.5)
WBC: 22.1 10*3/uL — ABNORMAL HIGH (ref 4.0–10.5)

## 2014-11-16 MED ORDER — METOPROLOL TARTRATE 50 MG PO TABS
50.0000 mg | ORAL_TABLET | Freq: Two times a day (BID) | ORAL | Status: DC
  Administered 2014-11-16 – 2014-11-17 (×4): 50 mg via ORAL
  Filled 2014-11-16 (×6): qty 1

## 2014-11-16 MED ORDER — AMIODARONE LOAD VIA INFUSION
150.0000 mg | Freq: Once | INTRAVENOUS | Status: AC
  Administered 2014-11-16: 150 mg via INTRAVENOUS
  Filled 2014-11-16: qty 83.34

## 2014-11-16 MED ORDER — HEPARIN (PORCINE) IN NACL 100-0.45 UNIT/ML-% IJ SOLN
1800.0000 [IU]/h | INTRAMUSCULAR | Status: AC
  Administered 2014-11-16: 1150 [IU]/h via INTRAVENOUS
  Filled 2014-11-16 (×6): qty 250

## 2014-11-16 MED ORDER — INSULIN ASPART 100 UNIT/ML ~~LOC~~ SOLN
0.0000 [IU] | Freq: Three times a day (TID) | SUBCUTANEOUS | Status: DC
  Administered 2014-11-16 (×2): 2 [IU] via SUBCUTANEOUS

## 2014-11-16 MED ORDER — FUROSEMIDE 10 MG/ML IJ SOLN
40.0000 mg | Freq: Once | INTRAMUSCULAR | Status: AC
  Administered 2014-11-16: 40 mg via INTRAVENOUS
  Filled 2014-11-16: qty 4

## 2014-11-16 NOTE — Progress Notes (Signed)
DAILY PROGRESS NOTE  Subjective:  In to A-fib with RVR overnight. Loading on IV amiodarone. IV heparin restarted.  Objective:   Temp:  [97.4 F (36.3 C)-99.9 F (37.7 C)] 97.9 F (36.6 C) (04/03 1214) Pulse Rate:  [56-113] 93 (04/03 1100) Resp:  [12-45] 12 (04/03 1100) BP: (87-146)/(58-108) 100/76 mmHg (04/03 1100) SpO2:  [73 %-100 %] 85 % (04/03 1100) Weight:  [221 lb 9 oz (100.5 kg)] 221 lb 9 oz (100.5 kg) (04/03 0600) Weight change: 6 lb 2.8 oz (2.8 kg)  Intake/Output from previous day: 04/02 0701 - 04/03 0700 In: 3078.6 [P.O.:1800; I.V.:1128.6; IV Piggyback:150] Out: 2020 [Urine:1610; Chest Tube:410]  Intake/Output from this shift: Total I/O In: 772 [P.O.:480; I.V.:242; IV Piggyback:50] Out: 485 [Urine:325; Chest Tube:160]  Medications: Current Facility-Administered Medications  Medication Dose Route Frequency Provider Last Rate Last Dose  . 0.9 %  sodium chloride infusion  250 mL Intravenous Continuous [name], MD   250 mL at 11/15/14 0600  . acetaminophen (TYLENOL) tablet 1,000 mg  1,000 mg Oral 4 times per day [name], MD   1,000 mg at 11/16/14 1219  . albuterol (PROVENTIL) (2.5 MG/3ML) 0.083% nebulizer solution 2.5 mg  2.5 mg Nebulization Q4H PRN [name], MD      . amiodarone (NEXTERONE PREMIX) 360 MG/200ML (1.8 mg/mL) IV infusion  30 mg/hr Intravenous Continuous [name], MD 16.7 mL/hr at 11/16/14 1200 30 mg/hr at 11/16/14 1200  . aspirin EC tablet 325 mg  325 mg Oral Daily [name], MD   325 mg at 11/16/14 0945  . bisacodyl (DULCOLAX) EC tablet 10 mg  10 mg Oral Daily [name], MD   10 mg at 11/16/14 0945   Or  . bisacodyl (DULCOLAX) suppository 10 mg  10 mg Rectal Daily [name], MD      . diltiazem (CARDIZEM) 100 mg in dextrose 5 % 100 mL (1 mg/mL) infusion  5-15 mg/hr Intravenous Titrated [name], MD 15 mL/hr at 11/16/14 1212 15 mg/hr at 11/16/14 1212  . docusate sodium (COLACE) capsule 200 mg  200  mg Oral Daily [name], MD   200 mg at 11/16/14 0945  . heparin ADULT infusion 100 units/mL (25000 units/250 mL)  1,150 Units/hr Intravenous Continuous Skeet Simmer, RPH      . insulin aspart (novoLOG) injection 0-15 Units  0-15 Units Subcutaneous TID WC [name], MD   2 Units at 11/16/14 1219  . metoprolol (LOPRESSOR) injection 2.5-5 mg  2.5-5 mg Intravenous Q2H PRN [name], MD      . metoprolol (LOPRESSOR) tablet 50 mg  50 mg Oral BID [name], MD   50 mg at 11/16/14 0945  . midazolam (VERSED) injection 1 mg  1 mg Intravenous Q1H PRN [name], MD      . morphine 2 MG/ML injection 2 mg  2 mg Intravenous Q1H PRN [name], MD      . ondansetron [facility]) injection 4 mg  4 mg Intravenous Q6H PRN [name], MD      . oxyCODONE (Oxy IR/ROXICODONE) immediate release tablet 5-10 mg  5-10 mg Oral Q3H PRN [name], MD   10 mg at 11/16/14 0835  . pantoprazole (PROTONIX) EC tablet 40 mg  40 mg Oral Daily [name], MD   40 mg at 11/16/14 0945  . sodium chloride 0.9 % injection 3 mL  3 mL Intravenous  Q12H [name], MD   3 mL at 11/16/14 0941  . sodium chloride 0.9 % injection 3 mL  3 mL Intravenous PRN [name], MD   3 mL at 11/15/14 1956  . traMADol (ULTRAM) tablet 50-100 mg  50-100 mg Oral Q4H PRN [name], MD   100 mg at 11/15/14 1950    Physical Exam: General appearance: alert and no distress Neck: no carotid bruit and no JVD Lungs: clear to auscultation bilaterally Heart: irregularly irregular rhythm and no murmur Abdomen: soft, non-tender; bowel sounds normal; no masses,  no organomegaly Extremities: extremities normal, atraumatic, no cyanosis or edema Pulses: 2+ and symmetric Skin: Skin color, texture, turgor normal. No rashes or lesions Neurologic: Mental status: Alert and oriented, somewhat slow reactions, but following commands Psych: Pleasant  Lab Results: Results for orders placed or performed during the  hospital encounter of 10/29/14 (from the past 48 hour(s))  I-STAT 3, arterial blood gas (G3+)     Status: Abnormal   Collection Time: 11/14/14  1:19 PM  Result Value Ref Range   pH, Arterial 7.348 (L) 7.350 - 7.450   pCO2 arterial 39.0 35.0 - 45.0 mmHg   pO2, Arterial 65.0 (L) 80.0 - 100.0 mmHg   Bicarbonate 21.6 20.0 - 24.0 mEq/L   TCO2 23 0 - 100 mmol/L   O2 Saturation 92.0 %   Acid-base deficit 4.0 (H) 0.0 - 2.0 mmol/L   Patient temperature 36.2 C    Collection site ARTERIAL LINE    Drawn by Nurse    Sample type ARTERIAL   I-STAT 4, (NA,K, GLUC, HGB,HCT)     Status: Abnormal   Collection Time: 11/14/14  1:30 PM  Result Value Ref Range   Sodium 143 135 - 145 mmol/L   Potassium 4.0 3.5 - 5.1 mmol/L   Glucose, Bld 117 (H) 70 - 99 mg/dL   HCT 44.0 36.0 - 46.0 %   Hemoglobin 15.0 12.0 - 15.0 g/dL  Glucose, capillary     Status: Abnormal   Collection Time: 11/14/14  2:25 PM  Result Value Ref Range   Glucose-Capillary 68 (L) 70 - 99 mg/dL   Comment 1 Repeat Test   Glucose, capillary     Status: Abnormal   Collection Time: 11/14/14  2:26 PM  Result Value Ref Range   Glucose-Capillary 106 (H) 70 - 99 mg/dL   Comment 1 Arterial Specimen   Glucose, capillary     Status: None   Collection Time: 11/14/14  3:23 PM  Result Value Ref Range   Glucose-Capillary 98 70 - 99 mg/dL  Glucose, capillary     Status: None   Collection Time: 11/14/14  4:06 PM  Result Value Ref Range   Glucose-Capillary 90 70 - 99 mg/dL   Comment 1 Arterial Specimen   Glucose, capillary     Status: Abnormal   Collection Time: 11/14/14  5:11 PM  Result Value Ref Range   Glucose-Capillary 106 (H) 70 - 99 mg/dL  I-STAT 3, arterial blood gas (G3+)     Status: Abnormal   Collection Time: 11/14/14  6:02 PM  Result Value Ref Range   pH, Arterial 7.362 7.350 - 7.450   pCO2 arterial 31.8 (L) 35.0 - 45.0 mmHg   pO2, Arterial 74.0 (L) 80.0 - 100.0 mmHg   Bicarbonate 18.1 (L) 20.0 - 24.0 mEq/L   TCO2 19 0 - 100  mmol/L   O2 Saturation 95.0 %   Acid-base deficit 7.0 (H) 0.0 - 2.0 mmol/L  Patient temperature 36.7 C    Collection site ARTERIAL LINE    Drawn by Operator    Sample type ARTERIAL   I-STAT, chem 8     Status: Abnormal   Collection Time: 11/14/14  6:44 PM  Result Value Ref Range   Sodium 141 135 - 145 mmol/L   Potassium 4.3 3.5 - 5.1 mmol/L   Chloride 111 96 - 112 mmol/L   BUN 10 6 - 23 mg/dL   Creatinine, Ser 1.00 0.50 - 1.10 mg/dL   Glucose, Bld 121 (H) 70 - 99 mg/dL   Calcium, Ion 1.23 1.13 - 1.30 mmol/L   TCO2 16 0 - 100 mmol/L   Hemoglobin 11.2 (L) 12.0 - 15.0 g/dL   HCT 33.0 (L) 36.0 - 46.0 %  CBC     Status: Abnormal   Collection Time: 11/14/14  6:50 PM  Result Value Ref Range   WBC 18.3 (H) 4.0 - 10.5 K/uL   RBC 3.72 (L) 3.87 - 5.11 MIL/uL   Hemoglobin 10.8 (L) 12.0 - 15.0 g/dL    Comment: REPEATED TO VERIFY SPECIMEN CHECKED FOR CLOTS DELTA CHECK NOTED    HCT 32.8 (L) 36.0 - 46.0 %   MCV 88.2 78.0 - 100.0 fL   MCH 29.0 26.0 - 34.0 pg   MCHC 32.9 30.0 - 36.0 g/dL   RDW 16.5 (H) 11.5 - 15.5 %   Platelets 93 (L) 150 - 400 K/uL    Comment: REPEATED TO VERIFY SPECIMEN CHECKED FOR CLOTS CONSISTENT WITH PREVIOUS RESULT   Creatinine, serum     Status: Abnormal   Collection Time: 11/14/14  6:50 PM  Result Value Ref Range   Creatinine, Ser 1.09 0.50 - 1.10 mg/dL   GFR calc non Af Amer 51 (L) >90 mL/min   GFR calc Af Amer 59 (L) >90 mL/min    Comment: (NOTE) The eGFR has been calculated using the CKD EPI equation. This calculation has not been validated in all clinical situations. eGFR's persistently <90 mL/min signify possible Chronic Kidney Disease.   Magnesium     Status: Abnormal   Collection Time: 11/14/14  6:50 PM  Result Value Ref Range   Magnesium 2.7 (H) 1.5 - 2.5 mg/dL  I-STAT 3, arterial blood gas (G3+)     Status: Abnormal   Collection Time: 11/14/14  7:10 PM  Result Value Ref Range   pH, Arterial 7.328 (L) 7.350 - 7.450   pCO2 arterial 34.0 (L)  35.0 - 45.0 mmHg   pO2, Arterial 68.0 (L) 80.0 - 100.0 mmHg   Bicarbonate 17.9 (L) 20.0 - 24.0 mEq/L   TCO2 19 0 - 100 mmol/L   O2 Saturation 92.0 %   Acid-base deficit 7.0 (H) 0.0 - 2.0 mmol/L   Patient temperature 36.8 C    Collection site ARTERIAL LINE    Drawn by Operator    Sample type ARTERIAL   Glucose, capillary     Status: Abnormal   Collection Time: 11/14/14  8:06 PM  Result Value Ref Range   Glucose-Capillary 109 (H) 70 - 99 mg/dL   Comment 1 Notify RN    Comment 2 Document in Chart   Glucose, capillary     Status: Abnormal   Collection Time: 11/15/14 12:06 AM  Result Value Ref Range   Glucose-Capillary 106 (H) 70 - 99 mg/dL   Comment 1 Notify RN    Comment 2 Document in Chart   Glucose, capillary     Status: Abnormal   Collection Time: 11/15/14  3:48 AM  Result Value Ref Range   Glucose-Capillary 124 (H) 70 - 99 mg/dL   Comment 1 Notify RN    Comment 2 Document in Chart   CBC     Status: Abnormal   Collection Time: 11/15/14  3:57 AM  Result Value Ref Range   WBC 19.8 (H) 4.0 - 10.5 K/uL   RBC 3.40 (L) 3.87 - 5.11 MIL/uL   Hemoglobin 9.9 (L) 12.0 - 15.0 g/dL   HCT 30.6 (L) 36.0 - 46.0 %   MCV 90.0 78.0 - 100.0 fL   MCH 29.1 26.0 - 34.0 pg   MCHC 32.4 30.0 - 36.0 g/dL   RDW 16.6 (H) 11.5 - 15.5 %   Platelets 98 (L) 150 - 400 K/uL    Comment: RESULT CHECKED  Basic metabolic panel     Status: Abnormal   Collection Time: 11/15/14  3:57 AM  Result Value Ref Range   Sodium 140 135 - 145 mmol/L   Potassium 4.7 3.5 - 5.1 mmol/L   Chloride 114 (H) 96 - 112 mmol/L   CO2 21 19 - 32 mmol/L   Glucose, Bld 130 (H) 70 - 99 mg/dL   BUN 12 6 - 23 mg/dL   Creatinine, Ser 1.35 (H) 0.50 - 1.10 mg/dL   Calcium 8.2 (L) 8.4 - 10.5 mg/dL   GFR calc non Af Amer 39 (L) >90 mL/min   GFR calc Af Amer 45 (L) >90 mL/min    Comment: (NOTE) The eGFR has been calculated using the CKD EPI equation. This calculation has not been validated in all clinical situations. eGFR's  persistently <90 mL/min signify possible Chronic Kidney Disease.    Anion gap 5 5 - 15  Magnesium     Status: None   Collection Time: 11/15/14  3:57 AM  Result Value Ref Range   Magnesium 2.2 1.5 - 2.5 mg/dL  Glucose, capillary     Status: Abnormal   Collection Time: 11/15/14  8:04 AM  Result Value Ref Range   Glucose-Capillary 122 (H) 70 - 99 mg/dL   Comment 1 Capillary Specimen    Comment 2 Notify RN   Glucose, capillary     Status: Abnormal   Collection Time: 11/15/14 12:42 PM  Result Value Ref Range   Glucose-Capillary 157 (H) 70 - 99 mg/dL   Comment 1 Capillary Specimen    Comment 2 Notify RN   Glucose, capillary     Status: Abnormal   Collection Time: 11/15/14  4:10 PM  Result Value Ref Range   Glucose-Capillary 141 (H) 70 - 99 mg/dL   Comment 1 Capillary Specimen    Comment 2 Notify RN   Magnesium     Status: Abnormal   Collection Time: 11/15/14  5:00 PM  Result Value Ref Range   Magnesium 1.0 (L) 1.5 - 2.5 mg/dL  CBC     Status: Abnormal   Collection Time: 11/15/14  5:00 PM  Result Value Ref Range   WBC 24.1 (H) 4.0 - 10.5 K/uL   RBC 3.49 (L) 3.87 - 5.11 MIL/uL   Hemoglobin 10.3 (L) 12.0 - 15.0 g/dL   HCT 31.8 (L) 36.0 - 46.0 %   MCV 91.1 78.0 - 100.0 fL   MCH 29.5 26.0 - 34.0 pg   MCHC 32.4 30.0 - 36.0 g/dL   RDW 16.7 (H) 11.5 - 15.5 %   Platelets 113 (L) 150 - 400 K/uL    Comment: REPEATED TO VERIFY FEW LARGE PLATELETS NOTED ON SMEAR PLATELET  COUNT CONFIRMED BY SMEAR   Creatinine, serum     Status: Abnormal   Collection Time: 11/15/14  5:00 PM  Result Value Ref Range   Creatinine, Ser 0.65 0.50 - 1.10 mg/dL    Comment: REPEATED TO VERIFY DELTA CHECK NOTED    GFR calc non Af Amer 89 (L) >90 mL/min   GFR calc Af Amer >90 >90 mL/min    Comment: (NOTE) The eGFR has been calculated using the CKD EPI equation. This calculation has not been validated in all clinical situations. eGFR's persistently <90 mL/min signify possible Chronic Kidney Disease.     I-STAT, chem 8     Status: Abnormal   Collection Time: 11/15/14  5:14 PM  Result Value Ref Range   Sodium 144 135 - 145 mmol/L   Potassium 3.9 3.5 - 5.1 mmol/L   Chloride 110 96 - 112 mmol/L   BUN 13 6 - 23 mg/dL   Creatinine, Ser 1.20 (H) 0.50 - 1.10 mg/dL   Glucose, Bld 138 (H) 70 - 99 mg/dL   Calcium, Ion 1.12 (L) 1.13 - 1.30 mmol/L   TCO2 15 0 - 100 mmol/L   Hemoglobin 9.5 (L) 12.0 - 15.0 g/dL   HCT 28.0 (L) 36.0 - 46.0 %  Glucose, capillary     Status: Abnormal   Collection Time: 11/15/14  8:15 PM  Result Value Ref Range   Glucose-Capillary 135 (H) 70 - 99 mg/dL   Comment 1 Notify RN    Comment 2 Document in Chart   Glucose, capillary     Status: Abnormal   Collection Time: 11/15/14 11:44 PM  Result Value Ref Range   Glucose-Capillary 134 (H) 70 - 99 mg/dL   Comment 1 Notify RN    Comment 2 Document in Chart   Glucose, capillary     Status: Abnormal   Collection Time: 11/16/14  3:27 AM  Result Value Ref Range   Glucose-Capillary 137 (H) 70 - 99 mg/dL   Comment 1 Venous Specimen    Comment 2 Notify RN   Basic metabolic panel     Status: Abnormal   Collection Time: 11/16/14  3:50 AM  Result Value Ref Range   Sodium 138 135 - 145 mmol/L   Potassium 4.2 3.5 - 5.1 mmol/L   Chloride 108 96 - 112 mmol/L   CO2 23 19 - 32 mmol/L   Glucose, Bld 142 (H) 70 - 99 mg/dL   BUN 16 6 - 23 mg/dL   Creatinine, Ser 1.34 (H) 0.50 - 1.10 mg/dL   Calcium 8.7 8.4 - 10.5 mg/dL   GFR calc non Af Amer 39 (L) >90 mL/min   GFR calc Af Amer 46 (L) >90 mL/min    Comment: (NOTE) The eGFR has been calculated using the CKD EPI equation. This calculation has not been validated in all clinical situations. eGFR's persistently <90 mL/min signify possible Chronic Kidney Disease.    Anion gap 7 5 - 15  CBC     Status: Abnormal   Collection Time: 11/16/14  3:50 AM  Result Value Ref Range   WBC 22.1 (H) 4.0 - 10.5 K/uL   RBC 3.31 (L) 3.87 - 5.11 MIL/uL   Hemoglobin 9.8 (L) 12.0 - 15.0 g/dL    HCT 30.2 (L) 36.0 - 46.0 %   MCV 91.2 78.0 - 100.0 fL   MCH 29.6 26.0 - 34.0 pg   MCHC 32.5 30.0 - 36.0 g/dL   RDW 16.8 (H) 11.5 - 15.5 %   Platelets  121 (L) 150 - 400 K/uL  Glucose, capillary     Status: Abnormal   Collection Time: 11/16/14  8:03 AM  Result Value Ref Range   Glucose-Capillary 124 (H) 70 - 99 mg/dL   Comment 1 Capillary Specimen    Comment 2 Notify RN   Glucose, capillary     Status: Abnormal   Collection Time: 11/16/14 12:13 PM  Result Value Ref Range   Glucose-Capillary 145 (H) 70 - 99 mg/dL   Comment 1 Capillary Specimen    Comment 2 Notify RN     Imaging: Dg Chest Port 1 View  11/16/2014   CLINICAL DATA:  Atelectasis and shortness of breath.  Heart failure.  EXAM: PORTABLE CHEST - 1 VIEW  COMPARISON:  One day prior  FINDINGS: Removal of Swan-Ganz catheter with left IJ Cordis sheath remaining in place. Prior median sternotomy. Mediastinal drain and bilateral chest tubes persist.  Midline trachea. Cardiomegaly accentuated by AP portable technique. Left atrial occlusion device. Small left pleural effusion is similar. A small right pleural effusion is increased. No pneumothorax. Lung volumes are diminished. Similar left and worsened right base airspace disease. Mild pulmonary venous congestion.  IMPRESSION: Slight worsening in aeration, with increased right base airspace disease adjacent to enlarging pleural fluid.  Low lung volumes with mild pulmonary venous congestion.  Left base atelectasis and pleural fluid remain.  Appropriate position of support apparatus, without pneumothorax.   Electronically Signed   By: Abigail Miyamoto M.D.   On: 11/16/2014 09:20   Dg Chest Port 1 View  11/15/2014   CLINICAL DATA:  Followup atelectasis. Patient with asthma and hypertension history. Recent CABG surgery.  EXAM: PORTABLE CHEST - 1 VIEW  COMPARISON:  11/14/2014  FINDINGS: There is been subtle clearing at the right lung base. There is persistent left basilar opacity. Lung base opacities  likely combination of small effusions and atelectasis.  No overt pulmonary edema.  No pneumothorax.  Endotracheal tube and nasogastric tube have been removed since the prior exam. Left internal jugular Swan-Ganz catheter, left chest tube and mediastinal tube are stable.  No mediastinal widening.  Cardiac silhouette is enlarged but stable.  IMPRESSION: 1. Status post extubation and removal of the nasogastric tube. 2. Mild clearing at the right lung base likely due to improved atelectasis. There is still persistent basilar atelectasis bilaterally with associated small effusions. No pulmonary edema. No pneumothorax.   Electronically Signed   By: Lajean Manes M.D.   On: 11/15/2014 07:54   Dg Chest Port 1 View  11/14/2014   CLINICAL DATA:  Atelectasis.  EXAM: PORTABLE CHEST - 1 VIEW  COMPARISON:  11/12/2014  FINDINGS: Interval median sternotomy with CABG changes and left atrial clip. There is a endotracheal tube with tip at the clavicular heads. A right IJ central line has been removed and a left IJ Swan-Ganz catheter has been placed. The tip is directed towards the right main pulmonary artery. The gastric suction tube reaches the stomach at least. Thoracic drains are present.  Haziness of the lower chest consistent with atelectasis and layering effusions. No evidence of pneumothorax. Stable cardiomegaly and aortic tortuosity. Mild pulmonary venous congestion.  IMPRESSION: 1. Unremarkable positioning of tubes and central line. 2. Postoperative atelectasis and layering effusions. 3. Cardiomegaly and venous congestion.   Electronically Signed   By: Monte Fantasia M.D.   On: 11/14/2014 14:04    Assessment:  Principal Problem:   S/P CABG x 2 with clipping of LA appendage Active Problems:   HTN (hypertension)  Atrial fibrillation   CAP (community acquired pneumonia)   Cardiomyopathy   Hypokalemia   Acute on chronic combined systolic and diastolic HF (heart failure)   NSTEMI (non-ST elevated myocardial  infarction)   SOB (shortness of breath)   Atrial fibrillation with RVR   PEA (Pulseless electrical activity)   Acute respiratory failure with hypoxemia   Anoxic brain injury   Essential hypertension   Cardiac arrest   Cardiomyopathy, ischemic   Coronary artery disease due to lipid rich plaque   Accelerated hypertension   Left main coronary artery disease   Plan:  1. Cardiac arrest - Ms. Lister had a brady-asystolic arrest. She underwent cardiac catheterization which revealed 80% LM stenosis and multivessel CAD. CT surgery was consulted and she underwent CABG and left atrial appendage exclusion. She is recovering well. 2. A-fib with RVR - intermittent - just recurred last night. Now on IV amiodarone and cardizem. Agree with IV heparin to support cardioversion if necessary this week. 3. Cardiomyopathy - clear ischemic etiology. LVEF 35-40% - hopefully will improve with CABG  Cardiology will continue to follow with you.  Time Spent Directly with Patient: 15 minutes  Length of Stay:  LOS: 17 days   [name], MD, [facility] Attending Cardiologist [facility]  Lizza Huffaker C 11/16/2014, 1:17 PM

## 2014-11-16 NOTE — Progress Notes (Signed)
[address]       [place] [address]             [phone]        CARDIOTHORACIC SURGERY PROGRESS NOTE   R2 Days Post-Op Procedure(s) (LRB): CORONARY ARTERY BYPASS GRAFTING (CABG)TIMES 2 USING LEFT INTERNAL MAMMARY ARTERY AND RIGHT SAPHENOUS VEIN HARVESTED ENDOSCOPICALLY (N/A) CLIPPING OF ATRIAL APPENDAGE (N/A) TRANSESOPHAGEAL ECHOCARDIOGRAM (TEE) (N/A)  Subjective: Some mild SOB this am.  Mild soreness across lower chest, upper abdomen - seems associated with chest tubes.  Overall reports feeling fairly well.  Objective: Vital signs: BP Readings from Last 1 Encounters:  11/16/14 113/72   Pulse Readings from Last 1 Encounters:  11/16/14 58   Resp Readings from Last 1 Encounters:  11/16/14 18   Temp Readings from Last 1 Encounters:  11/16/14 97.5 F (36.4 C) Oral    Hemodynamics:    Physical Exam:  Rhythm:   Afib w/ controlled rate  Breath sounds: Diminished at bases  Heart sounds:  irregular  Incisions:  Dressing dry, intact  Abdomen:  Soft, non-distended, non-tender  Extremities:  Warm, well-perfused  Chest tubes:  Decreased volume thin serosanguinous output, no air leak    Intake/Output from previous day: 04/02 0701 - 04/03 0700 In: 3078.6 [P.O.:1800; I.V.:1128.6; IV Piggyback:150] Out: 2020 [Urine:1610; Chest Tube:410] Intake/Output this shift: Total I/O In: 321.7 [P.O.:240; I.V.:31.7; IV Piggyback:50] Out: 160 [Urine:40; Chest Tube:120]  Lab Results:  CBC: Recent Labs  11/15/14 1700 11/15/14 1714 11/16/14 0350  WBC 24.1*  --  22.1*  HGB 10.3* 9.5* 9.8*  HCT 31.8* 28.0* 30.2*  PLT 113*  --  121*    BMET:  Recent Labs  11/15/14 0357  11/15/14 1714 11/16/14 0350  NA 140  --  144 138  K 4.7  --  3.9 4.2  CL 114*  --  110 108  CO2 21  --   --  23  GLUCOSE 130*  --  138* 142*  BUN 12  --  13 16  CREATININE 1.35*  < > 1.20* 1.34*  CALCIUM 8.2*  --   --  8.7  < > = values in this interval not displayed.   CBG  (last 3)   Recent Labs  11/15/14 2344 11/16/14 0327 11/16/14 0803  GLUCAP 134* 137* 124*    ABG    Component Value Date/Time   PHART 7.328* 11/14/2014 1910   PCO2ART 34.0* 11/14/2014 1910   PO2ART 68.0* 11/14/2014 1910   HCO3 17.9* 11/14/2014 1910   TCO2 15 11/15/2014 1714   ACIDBASEDEF 7.0* 11/14/2014 1910   O2SAT 92.0 11/14/2014 1910    CXR:  PORTABLE CHEST - 1 VIEW  COMPARISON: One day prior  FINDINGS: Removal of Swan-Ganz catheter with left IJ Cordis sheath remaining in place. Prior median sternotomy. Mediastinal drain and bilateral chest tubes persist.  Midline trachea. Cardiomegaly accentuated by AP portable technique. Left atrial occlusion device. Small left pleural effusion is similar. A small right pleural effusion is increased. No pneumothorax. Lung volumes are diminished. Similar left and worsened right base airspace disease. Mild pulmonary venous congestion.  IMPRESSION: Slight worsening in aeration, with increased right base airspace disease adjacent to enlarging pleural fluid.  Low lung volumes with mild pulmonary venous congestion.  Left base atelectasis and pleural fluid remain.  Appropriate position of support apparatus, without pneumothorax.   Electronically Signed  By: Abigail Miyamoto M.D.  On: 11/16/2014 09:20        Assessment/Plan: S/P  Procedure(s) (LRB): CORONARY ARTERY BYPASS GRAFTING (CABG)TIMES 2 USING LEFT INTERNAL MAMMARY ARTERY AND RIGHT SAPHENOUS VEIN HARVESTED ENDOSCOPICALLY (N/A) CLIPPING OF ATRIAL APPENDAGE (N/A) TRANSESOPHAGEAL ECHOCARDIOGRAM (TEE) (N/A)  Overall stable POD2 Went back into rapid Afib yesterday afternoon and has remained in Afib overnight despite resuming IV amiodarone, now rate-controlled w/ addition of IV diltiazem History of new-onset atrial fibrillation pre-op - probably not a good candidate for long term anticoagulation Expected post op acute blood loss anemia, stable Expected post op  atelectasis, moderate Expected post op volume excess, mild Acute exacerbation of chronic diastolic CHF Hypertension Obesity Long standing tobacco abuse COPD   Will re-bolus IV amiodarone and continue infusion  Increase metoprolol  Start IV heparin w/out bolus and consider DCCV - will ask Cardiology to get back involved  D/C mediastinal tubes but leave pleural chest tube in until drainage  decreased  Pulm toilet and nebs  Mobilize  D/C foley  Keep in ICU for now  [name] 11/16/2014 9:23 AM

## 2014-11-16 NOTE — Plan of Care (Signed)
Problem: Phase II - Intermediate Post-Op Goal: Maintain Hemodynamic Stability Outcome: Progressing Pt with hemodynamic stability until 1600 11-15-14 when Pt converted to Afib with RVR at 130-150 bpm, MD notified and Amiodarone protocol initiated; Diltiazem added at 2000 for continued Afib with rate of 120-130. Will continue to monitor.

## 2014-11-16 NOTE — Progress Notes (Signed)
[facility] BRIEF SICU PROGRESS NOTE  2 Days Post-Op  S/P Procedure(s) (LRB): CORONARY ARTERY BYPASS GRAFTING (CABG)TIMES 2 USING LEFT INTERNAL MAMMARY ARTERY AND RIGHT SAPHENOUS VEIN HARVESTED ENDOSCOPICALLY (N/A) CLIPPING OF ATRIAL APPENDAGE (N/A) TRANSESOPHAGEAL ECHOCARDIOGRAM (TEE) (N/A)   Stable day and looks good, but remains in Afib HR and BP stable  Plan: Continue current plan.  I favor DCCV if she doesn't convert w/in 24-48 hours  [name] 11/16/2014 5:38 PM

## 2014-11-16 NOTE — Progress Notes (Signed)
ANTICOAGULATION CONSULT NOTE - Follow Up Consult  Pharmacy Consult for Heparin Indication: atrial fibrillation  Allergies  Allergen Reactions  . Bee Venom Anaphylaxis  . Shrimp [Shellfish Allergy] Swelling    Patient Measurements: Height: 5' 7.5" (171.5 cm) Weight: 221 lb 9 oz (100.5 kg) IBW/kg (Calculated) : 62.75 Heparin Dosing Weight: 85 kg  Vital Signs: Temp: 97.5 F (36.4 C) (04/03 0807) Temp Source: Oral (04/03 0807) BP: 113/74 mmHg (04/03 0945) Pulse Rate: 88 (04/03 0945)  Labs:  Recent Labs  11/14/14 0329  11/14/14 1315  11/15/14 0357 11/15/14 1700 11/15/14 1714 11/16/14 0350  HGB 12.2  < > 10.2*  < > 9.9* 10.3* 9.5* 9.8*  HCT 37.4  < > 31.2*  < > 30.6* 31.8* 28.0* 30.2*  PLT 118*  < > 88*  < > 98* 113*  --  121*  APTT 38*  --  40*  --   --   --   --   --   LABPROT 14.5  --  17.1*  --   --   --   --   --   INR 1.11  --  1.38  --   --   --   --   --   CREATININE 1.20*  < >  --   < > 1.35* 0.65 1.20* 1.34*  < > = values in this interval not displayed.  Estimated Creatinine Clearance: 48.7 mL/min (by C-G formula based on Cr of 1.34).  Assessment:   POD# 2 CABG x 2 and clipping of left atrial appendage.   Post-op afib, to begin heparin ~3pm today without bolus.  OK for full-dose heparin per discussion with Dr. Roxy Manns.   Rx had assisted with heparin dosing pre-op (3/17>>4/1).  Required heparin 1200-1600 units/hr for therapeutic levels.  Platelet count low at 121, but has trended up from post-op low of 75; 149 on admit 3/16 and 126 pre-op.  Goal of Therapy:  Heparin level 0.3-0.7 units/ml, but will aim for 0.3-0.5 post-op Monitor platelets by anticoagulation protocol: Yes   Plan:   Begin heparin drip at 1150 units/hr (~ 14 units/kg hr) at 3pm today.   Heparin level and CBC in am and daily while on heparin.   Will target lower end of therapeutic range for now.  Arty Baumgartner, [employer] Pager: [phone] 11/16/2014,10:17 AM

## 2014-11-17 ENCOUNTER — Inpatient Hospital Stay (HOSPITAL_COMMUNITY): Payer: Commercial Managed Care - HMO

## 2014-11-17 ENCOUNTER — Encounter (HOSPITAL_COMMUNITY): Payer: Self-pay | Admitting: Thoracic Surgery (Cardiothoracic Vascular Surgery)

## 2014-11-17 DIAGNOSIS — Z951 Presence of aortocoronary bypass graft: Secondary | ICD-10-CM

## 2014-11-17 DIAGNOSIS — I4891 Unspecified atrial fibrillation: Secondary | ICD-10-CM

## 2014-11-17 DIAGNOSIS — I255 Ischemic cardiomyopathy: Secondary | ICD-10-CM

## 2014-11-17 DIAGNOSIS — I214 Non-ST elevation (NSTEMI) myocardial infarction: Principal | ICD-10-CM

## 2014-11-17 DIAGNOSIS — I495 Sick sinus syndrome: Secondary | ICD-10-CM

## 2014-11-17 LAB — BASIC METABOLIC PANEL
Anion gap: 8 (ref 5–15)
BUN: 21 mg/dL (ref 6–23)
CO2: 23 mmol/L (ref 19–32)
Calcium: 8.5 mg/dL (ref 8.4–10.5)
Chloride: 106 mmol/L (ref 96–112)
Creatinine, Ser: 1.55 mg/dL — ABNORMAL HIGH (ref 0.50–1.10)
GFR calc Af Amer: 38 mL/min — ABNORMAL LOW (ref >90)
GFR calc non Af Amer: 33 mL/min — ABNORMAL LOW (ref >90)
Glucose, Bld: 161 mg/dL — ABNORMAL HIGH (ref 70–99)
Potassium: 4.1 mmol/L (ref 3.5–5.1)
Sodium: 137 mmol/L (ref 135–145)

## 2014-11-17 LAB — CBC
HCT: 28.3 % — ABNORMAL LOW (ref 36.0–46.0)
Hemoglobin: 9.3 g/dL — ABNORMAL LOW (ref 12.0–15.0)
MCH: 29.1 pg (ref 26.0–34.0)
MCHC: 32.9 g/dL (ref 30.0–36.0)
MCV: 88.4 fL (ref 78.0–100.0)
Platelets: 122 10*3/uL — ABNORMAL LOW (ref 150–400)
RBC: 3.2 MIL/uL — ABNORMAL LOW (ref 3.87–5.11)
RDW: 16.8 % — ABNORMAL HIGH (ref 11.5–15.5)
WBC: 16.7 10*3/uL — ABNORMAL HIGH (ref 4.0–10.5)

## 2014-11-17 LAB — GLUCOSE, CAPILLARY
Glucose-Capillary: 438 mg/dL — ABNORMAL HIGH (ref 70–99)
Glucose-Capillary: 105 mg/dL — ABNORMAL HIGH (ref 70–99)
Glucose-Capillary: 117 mg/dL — ABNORMAL HIGH (ref 70–99)
Glucose-Capillary: 118 mg/dL — ABNORMAL HIGH (ref 70–99)
Glucose-Capillary: 127 mg/dL — ABNORMAL HIGH (ref 70–99)

## 2014-11-17 LAB — HEPARIN LEVEL (UNFRACTIONATED)
Heparin Unfractionated: <0.1 IU/mL — ABNORMAL LOW (ref 0.30–0.70)
Heparin Unfractionated: 0.18 IU/mL — ABNORMAL LOW (ref 0.30–0.70)
Heparin Unfractionated: 0.19 IU/mL — ABNORMAL LOW (ref 0.30–0.70)

## 2014-11-17 MED ORDER — SODIUM CHLORIDE 0.9 % IJ SOLN
10.0000 mL | Freq: Two times a day (BID) | INTRAMUSCULAR | Status: DC
Start: 2014-11-17 — End: 2014-11-23
  Administered 2014-11-17 – 2014-11-18 (×2): 10 mL
  Administered 2014-11-18: 20 mL
  Administered 2014-11-19 – 2014-11-20 (×2): 30 mL

## 2014-11-17 MED ORDER — FUROSEMIDE 10 MG/ML IJ SOLN
40.0000 mg | Freq: Once | INTRAMUSCULAR | Status: AC
  Administered 2014-11-17: 40 mg via INTRAVENOUS
  Filled 2014-11-17: qty 4

## 2014-11-17 MED ORDER — AMIODARONE IV BOLUS ONLY 150 MG/100ML
150.0000 mg | Freq: Once | INTRAVENOUS | Status: AC
Start: 2014-11-17 — End: 2014-11-17
  Administered 2014-11-17: 150 mg via INTRAVENOUS
  Filled 2014-11-17: qty 100

## 2014-11-17 MED ORDER — SODIUM CHLORIDE 0.9 % IJ SOLN
10.0000 mL | INTRAMUSCULAR | Status: DC | PRN
  Administered 2014-11-19 – 2014-11-20 (×2): 10 mL
  Administered 2014-11-21 – 2014-11-22 (×2): 30 mL
  Filled 2014-11-17 (×4): qty 40

## 2014-11-17 MED FILL — Dexmedetomidine HCl in NaCl 0.9% IV Soln 400 MCG/100ML: INTRAVENOUS | Qty: 100 | Status: AC

## 2014-11-17 NOTE — Progress Notes (Signed)
ANTICOAGULATION CONSULT NOTE - Follow Up Consult  Pharmacy Consult for Heparin Indication: atrial fibrillation  Allergies  Allergen Reactions  . Bee Venom Anaphylaxis  . Shrimp [Shellfish Allergy] Swelling    Patient Measurements: Height: 5' 7.5" (171.5 cm) Weight: 221 lb 1.9 oz (100.3 kg) IBW/kg (Calculated) : 62.75 Heparin Dosing Weight: 85 kg  Vital Signs: Temp: 97.3 F (36.3 C) (04/04 1204) Temp Source: Oral (04/04 1204) BP: 124/91 mmHg (04/04 1100) Pulse Rate: 45 (04/04 1100)  Labs:  Recent Labs  11/14/14 1315  11/15/14 1700 11/15/14 1714 11/16/14 0350 11/17/14 0348 11/17/14 0349 11/17/14 1057  HGB 10.2*  < > 10.3* 9.5* 9.8*  --  9.3*  --   HCT 31.2*  < > 31.8* 28.0* 30.2*  --  28.3*  --   PLT 88*  < > 113*  --  121*  --  122*  --   APTT 40*  --   --   --   --   --   --   --   LABPROT 17.1*  --   --   --   --   --   --   --   INR 1.38  --   --   --   --   --   --   --   HEPARINUNFRC  --   --   --   --   --  <0.10*  --  0.18*  CREATININE  --   < > 0.65 1.20* 1.34*  --  1.55*  --   < > = values in this interval not displayed.  Estimated Creatinine Clearance: 42.1 mL/min (by C-G formula based on Cr of 1.55).  Assessment: 69 yo F POD#3 from CABG started on Heparin yesterday for afib.  Pt had afib pre-op and was on heparin at that time as well.  Heparin level is subtherapeutic on 1400 units/hr.  No bleeding noted.  Goal of Therapy:  Heparin level 0.3-0.7 units/ml, but will aim for 0.3-0.5 post-op Monitor platelets by anticoagulation protocol: Yes   Plan:  Increase heparin drip to 1600 units/hr Heparin level in 6 hours Daily heparin level and CBC Will target lower end of therapeutic range for now.  [employer], Pharm.D., BCPS Clinical Pharmacist Pager [phone] 11/17/2014 12:45 PM

## 2014-11-17 NOTE — Progress Notes (Signed)
SICU p.m. Rounds  Patient out of bed ambulating and to bedside commode, having difficulty with BM this evening. Passing urine after Foley catheter has been removed.  Remains in atrial fibrillation with amiodarone, rate controlled  Cardiology consultation recommends continue current medical therapy and against cardioversion. She may need a permanent pacemaker.

## 2014-11-17 NOTE — Progress Notes (Signed)
ANTICOAGULATION CONSULT NOTE - Follow Up Consult  Pharmacy Consult for Heparin Indication: atrial fibrillation  Allergies  Allergen Reactions  . Bee Venom Anaphylaxis  . Shrimp [Shellfish Allergy] Swelling    Patient Measurements: Height: 5' 7.5" (171.5 cm) Weight: 221 lb 1.9 oz (100.3 kg) IBW/kg (Calculated) : 62.75 Heparin Dosing Weight: 85 kg  Vital Signs: Temp: 98.1 F (36.7 C) (04/04 1626) Temp Source: Oral (04/04 1626) BP: 136/88 mmHg (04/04 2100) Pulse Rate: 45 (04/04 1100)  Labs:  Recent Labs  11/15/14 1700 11/15/14 1714 11/16/14 0350 11/17/14 0348 11/17/14 0349 11/17/14 1057 11/17/14 2005  HGB 10.3* 9.5* 9.8*  --  9.3*  --   --   HCT 31.8* 28.0* 30.2*  --  28.3*  --   --   PLT 113*  --  121*  --  122*  --   --   HEPARINUNFRC  --   --   --  <0.10*  --  0.18* 0.19*  CREATININE 0.65 1.20* 1.34*  --  1.55*  --   --     Estimated Creatinine Clearance: 42.1 mL/min (by C-G formula based on Cr of 1.55).  Assessment: 69 yo F POD#3 from CABG re-started on Heparin yesterday for afib.  Pt had afib pre-op and was on heparin at that time as well.  Heparin level this evening remains SUBtherapeutic despite a rate increase this morning (HL 0.19 << 0.18, goal of 0.3-0.5). Per discussion with the RN - no interruptions in the drip or bleeding issues have been noted.   Goal of Therapy:  Heparin level 0.3-0.7 units/ml, but will aim for 0.3-0.5 post-op Monitor platelets by anticoagulation protocol: Yes   Plan:  1. Increase heparin to 1800 units/hr (18 ml/hr) 2. Will continue to monitor for any signs/symptoms of bleeding and will follow up with heparin level in 6 hours   Alycia Rossetti, PharmD, BCPS Clinical Pharmacist Pager: [phone] 11/17/2014 9:16 PM

## 2014-11-17 NOTE — Progress Notes (Signed)
Patient Name: [name] [name] Date of Encounter: 11/17/2014  Principal Problem:   S/P CABG x 2 with clipping of LA appendage Active Problems:   HTN (hypertension)   Atrial fibrillation   CAP (community acquired pneumonia)   Cardiomyopathy   Hypokalemia   Acute on chronic combined systolic and diastolic HF (heart failure)   NSTEMI (non-ST elevated myocardial infarction)   SOB (shortness of breath)   Atrial fibrillation with RVR   PEA (Pulseless electrical activity)   Acute respiratory failure with hypoxemia   Anoxic brain injury   Essential hypertension   Cardiac arrest   Cardiomyopathy, ischemic   Coronary artery disease due to lipid rich plaque   Accelerated hypertension   Left main coronary artery disease   Length of Stay: 18  SUBJECTIVE  In and out of atrial fibrillation. When the AFib abates, she has severe sinus bradycardia (40s), occasionally junctional escape rhythm  CURRENT MEDS . acetaminophen  1,000 mg Oral 4 times per day  . aspirin EC  325 mg Oral Daily  . bisacodyl  10 mg Oral Daily   Or  . bisacodyl  10 mg Rectal Daily  . docusate sodium  200 mg Oral Daily  . insulin aspart  0-15 Units Subcutaneous TID WC  . metoprolol tartrate  50 mg Oral BID  . pantoprazole  40 mg Oral Daily  . sodium chloride  10-40 mL Intracatheter Q12H  . sodium chloride  3 mL Intravenous Q12H    OBJECTIVE   Intake/Output Summary (Last 24 hours) at 11/17/14 1654 Last data filed at 11/17/14 1600  Gross per 24 hour  Intake 966.02 ml  Output   1590 ml  Net -623.98 ml   Filed Weights   11/15/14 0500 11/16/14 0600 11/17/14 0600  Weight: 215 lb 6.2 oz (97.7 kg) 221 lb 9 oz (100.5 kg) 221 lb 1.9 oz (100.3 kg)    PHYSICAL EXAM Filed Vitals:   11/17/14 1400 11/17/14 1500 11/17/14 1600 11/17/14 1626  BP: 111/95 94/66    Pulse:      Temp:    98.1 F (36.7 C)  TempSrc:    Oral  Resp: 22 24 23    Height:      Weight:      SpO2: 100%      General: Alert, oriented x3, no  distress Head: no evidence of trauma, PERRL, EOMI, no exophtalmos or lid lag, no myxedema, no xanthelasma; normal ears, nose and oropharynx Neck: normal jugular venous pulsations and no hepatojugular reflux; brisk carotid pulses without delay and no carotid bruits Chest: clear to auscultation, no signs of consolidation by percussion or palpation, normal fremitus, symmetrical and full respiratory excursions Cardiovascular: normal position and quality of the apical impulse, irregular rhythm, normal first and second heart sounds, no rubs or gallops, no murmur Abdomen: no tenderness or distention, no masses by palpation, no abnormal pulsatility or arterial bruits, normal bowel sounds, no hepatosplenomegaly Extremities: no clubbing, cyanosis or edema; 2+ radial, ulnar and brachial pulses bilaterally; 2+ right femoral, posterior tibial and dorsalis pedis pulses; 2+ left femoral, posterior tibial and dorsalis pedis pulses; no subclavian or femoral bruits Neurological: grossly nonfocal  LABS  CBC  Recent Labs  11/16/14 0350 11/17/14 0349  WBC 22.1* 16.7*  HGB 9.8* 9.3*  HCT 30.2* 28.3*  MCV 91.2 88.4  PLT 121* 998*   Basic Metabolic Panel  Recent Labs  11/15/14 0357 11/15/14 1700  11/16/14 0350 11/17/14 0349  NA 140  --   < > 138 137  K 4.7  --   < > 4.2 4.1  CL 114*  --   < > 108 106  CO2 21  --   --  23 23  GLUCOSE 130*  --   < > 142* 161*  BUN 12  --   < > 16 21  CREATININE 1.35* 0.65  < > 1.34* 1.55*  CALCIUM 8.2*  --   --  8.7 8.5  MG 2.2 1.0*  --   --   --   < > = values in this interval not displayed.  Radiology Studies Imaging results have been reviewed and Dg Chest Port 1 View  11/17/2014   CLINICAL DATA:  Status post right PICC placement  EXAM: PORTABLE CHEST - 1 VIEW  COMPARISON:  11/17/2014  FINDINGS: A right-sided PICC line is now identified. Catheter tip is noted in the distal superior vena cava in adequate position. A left jugular central venous sheath is again noted  and stable. The cardiac shadow is enlarged but stable. Postsurgical changes are noted. Increased density is noted in the right lung base consistent with pleural effusion.  IMPRESSION: Status post right PICC line placement in satisfactory position.  The remainder of the exam is stable.   Electronically Signed   By: Inez Catalina M.D.   On: 11/17/2014 16:37   Dg Chest Port 1 View  11/17/2014   CLINICAL DATA:  CABG, subsequent encounter.  EXAM: PORTABLE CHEST - 1 VIEW  COMPARISON:  11/16/2014 and CT chest 11/03/2014.  FINDINGS: Trachea is midline. Left IJ catheter sheath remains in place. Heart is enlarged, stable. Mediastinal drains have been removed. Left chest tube remains in place. Eight intact sternotomy wires. Left atrial appendage clip is noted.  There is bibasilar airspace opacification with a right pleural effusion. Possible small left pleural effusion. Suspect mild diffuse interstitial prominence and indistinctness.  IMPRESSION: Bilateral effusions with bibasilar airspace opacification, possibly representing atelectasis. Question mild edema.   Electronically Signed   By: Lorin Picket M.D.   On: 11/17/2014 07:36   Dg Chest Port 1 View  11/16/2014   CLINICAL DATA:  Atelectasis and shortness of breath.  Heart failure.  EXAM: PORTABLE CHEST - 1 VIEW  COMPARISON:  One day prior  FINDINGS: Removal of Swan-Ganz catheter with left IJ Cordis sheath remaining in place. Prior median sternotomy. Mediastinal drain and bilateral chest tubes persist.  Midline trachea. Cardiomegaly accentuated by AP portable technique. Left atrial occlusion device. Small left pleural effusion is similar. A small right pleural effusion is increased. No pneumothorax. Lung volumes are diminished. Similar left and worsened right base airspace disease. Mild pulmonary venous congestion.  IMPRESSION: Slight worsening in aeration, with increased right base airspace disease adjacent to enlarging pleural fluid.  Low lung volumes with mild  pulmonary venous congestion.  Left base atelectasis and pleural fluid remain.  Appropriate position of support apparatus, without pneumothorax.   Electronically Signed   By: Abigail Miyamoto M.D.   On: 11/16/2014 09:20    TELE AFib with RVR altternating with marked sinus bradycardia and junctional rhythm in the 40s   ASSESSMENT AND PLAN Postop#3 Prior to surgery, AF was present, but sinus bradycardia was not an issue. Suspect some degree of "stunning" of the conduction system. Unless sinus node recovers and atrial fibrillation abates in the next 24-48 hours she will probably need a dual chamber permanent pacemaker for tachycardia-bradycardia syndrome and bradycardia due to necessary meds. She is in and out of atrial fibrillation, cardioversion would not be  of benefit. Continue amiodarone and metoprolol in current doses for now - rate control is borderline adequate  Sanda Klein, MD, [facility] [phone] office [phone] pager 11/17/2014 4:54 PM

## 2014-11-17 NOTE — Progress Notes (Signed)
ANTICOAGULATION CONSULT NOTE - Follow Up Consult  Pharmacy Consult for Heparin Indication: atrial fibrillation  Allergies  Allergen Reactions  . Bee Venom Anaphylaxis  . Shrimp [Shellfish Allergy] Swelling    Patient Measurements: Height: 5' 7.5" (171.5 cm) Weight: 221 lb 9 oz (100.5 kg) IBW/kg (Calculated) : 62.75 Heparin Dosing Weight: 85 kg  Vital Signs: Temp: 97.8 F (36.6 C) (04/04 0400) Temp Source: Oral (04/04 0400) BP: 125/96 mmHg (04/04 0400) Pulse Rate: 109 (04/04 0400)  Labs:  Recent Labs  11/14/14 1315  11/15/14 1700 11/15/14 1714 11/16/14 0350 11/17/14 0348 11/17/14 0349  HGB 10.2*  < > 10.3* 9.5* 9.8*  --  9.3*  HCT 31.2*  < > 31.8* 28.0* 30.2*  --  28.3*  PLT 88*  < > 113*  --  121*  --  122*  APTT 40*  --   --   --   --   --   --   LABPROT 17.1*  --   --   --   --   --   --   INR 1.38  --   --   --   --   --   --   HEPARINUNFRC  --   --   --   --   --  <0.10*  --   CREATININE  --   < > 0.65 1.20* 1.34*  --  1.55*  < > = values in this interval not displayed.  Estimated Creatinine Clearance: 42.1 mL/min (by C-G formula based on Cr of 1.55).  Assessment:   POD# 2 CABG x 2 and clipping of left atrial appendage.   Post-op afib, to begin heparin ~3pm today without bolus.  OK for full-dose heparin per discussion with Dr. Roxy Manns.   Rx had assisted with heparin dosing pre-op (3/17>>4/1).  Required heparin 1200-1600 units/hr for therapeutic levels.  Platelet count low at 121, but has trended up from post-op low of 75; 149 on admit 3/16 and 126 pre-op.  Initial HL is undetectable on heparin 1150 units/hr. Nurse reports no issues with bleeding or infusion.  Goal of Therapy:  Heparin level 0.3-0.7 units/ml, but will aim for 0.3-0.5 post-op Monitor platelets by anticoagulation protocol: Yes   Plan:  Increase heparin drip at 1400 units/hr 6h HL Daily HL/CBC Will target lower end of therapeutic range for now.  [name] [name], PharmD Clinical  Pharmacist Pager [phone]  11/17/2014,4:43 AM

## 2014-11-17 NOTE — Progress Notes (Signed)
Peripherally Inserted Central Catheter/Midline Placement  The IV Nurse has discussed with the patient and/or persons authorized to consent for the patient, the purpose of this procedure and the potential benefits and risks involved with this procedure.  The benefits include less needle sticks, lab draws from the catheter and patient may be discharged home with the catheter.  Risks include, but not limited to, infection, bleeding, blood clot (thrombus formation), and puncture of an artery; nerve damage and irregular heat beat.  Alternatives to this procedure were also discussed.  PICC/Midline Placement Documentation        [name] [name] 11/17/2014, 3:50 PM

## 2014-11-17 NOTE — Progress Notes (Addendum)
[address]       [place], [address]             [phone]        CARDIOTHORACIC SURGERY PROGRESS NOTE   R3 Days Post-Op Procedure(s) (LRB): CORONARY ARTERY BYPASS GRAFTING (CABG)TIMES 2 USING LEFT INTERNAL MAMMARY ARTERY AND RIGHT SAPHENOUS VEIN HARVESTED ENDOSCOPICALLY (N/A) CLIPPING OF ATRIAL APPENDAGE (N/A) TRANSESOPHAGEAL ECHOCARDIOGRAM (TEE) (N/A)  Subjective: Feels okay.  Mild soreness in chest.  Denies SOB  Objective: Vital signs: BP Readings from Last 1 Encounters:  11/17/14 135/86   Pulse Readings from Last 1 Encounters:  11/17/14 106   Resp Readings from Last 1 Encounters:  11/17/14 20   Temp Readings from Last 1 Encounters:  11/17/14 97.4 F (36.3 C) Oral    Hemodynamics:    Physical Exam:  Rhythm:   Afib w/ HR 100.  Some brief periods of sinus brady but remaining in AFib 99% of the time  Breath sounds: Diminished at bases  Heart sounds:  irregular  Incisions:  Dressing dry, intact  Abdomen:  Soft, non-distended, non-tender  Extremities:  Warm, well-perfused  Chest tubes:  Low volume thin serosanguinous output, no air leak    Intake/Output from previous day: 04/03 0701 - 04/04 0700 In: 2139.4 [P.O.:1320; I.V.:769.4; IV Piggyback:50] Out: 775 [Urine:475; Chest Tube:300] Intake/Output this shift: Total I/O In: -  Out: 50 [Chest Tube:50]  Lab Results:  CBC: Recent Labs  11/16/14 0350 11/17/14 0349  WBC 22.1* 16.7*  HGB 9.8* 9.3*  HCT 30.2* 28.3*  PLT 121* 122*    BMET:  Recent Labs  11/16/14 0350 11/17/14 0349  NA 138 137  K 4.2 4.1  CL 108 106  CO2 23 23  GLUCOSE 142* 161*  BUN 16 21  CREATININE 1.34* 1.55*  CALCIUM 8.7 8.5     CBG (last 3)   Recent Labs  11/16/14 1555 11/16/14 2121 11/16/14 2124  GLUCAP 130* 438* 122*    ABG    Component Value Date/Time   PHART 7.328* 11/14/2014 1910   PCO2ART 34.0* 11/14/2014 1910   PO2ART 68.0* 11/14/2014 1910   HCO3 17.9* 11/14/2014 1910   TCO2  15 11/15/2014 1714   ACIDBASEDEF 7.0* 11/14/2014 1910   O2SAT 92.0 11/14/2014 1910    CXR: PORTABLE CHEST - 1 VIEW  COMPARISON: 11/16/2014 and CT chest 11/03/2014.  FINDINGS: Trachea is midline. Left IJ catheter sheath remains in place. Heart is enlarged, stable. Mediastinal drains have been removed. Left chest tube remains in place. Eight intact sternotomy wires. Left atrial appendage clip is noted.  There is bibasilar airspace opacification with a right pleural effusion. Possible small left pleural effusion. Suspect mild diffuse interstitial prominence and indistinctness.  IMPRESSION: Bilateral effusions with bibasilar airspace opacification, possibly representing atelectasis. Question mild edema.   Electronically Signed  By: Lorin Picket M.D.  On: 11/17/2014 07:36   Assessment/Plan: S/P Procedure(s) (LRB): CORONARY ARTERY BYPASS GRAFTING (CABG)TIMES 2 USING LEFT INTERNAL MAMMARY ARTERY AND RIGHT SAPHENOUS VEIN HARVESTED ENDOSCOPICALLY (N/A) CLIPPING OF ATRIAL APPENDAGE (N/A) TRANSESOPHAGEAL ECHOCARDIOGRAM (TEE) (N/A)  Overall stable POD3 Remains in Afib, rate-controlled on IV amiodarone and oral metoprolol, IV diltiazem successfully weaned off History of new-onset atrial fibrillation pre-op - probably not a good candidate for long term anticoagulation Expected post op acute blood loss anemia, stable Expected post op atelectasis, moderate Expected post op volume excess, mild Acute exacerbation of chronic diastolic CHF Acute renal insufficiency, mild, creatinine up to 1.5 today - ? prerenal  azotemia due to Afib and decreased perfusion vs acute kidney injury Expected post op volume excess, very mild, weight close to pre-op Post op leukocytosis, likely reactive, improving Hypertension Obesity Long standing tobacco abuse COPD   I think she would benefit from DCCV - await input from Cardiology  Continue IV amiodarone (will rebolus) and oral  metoprolol  Continue IV heparin  Watch renal function  D/C pleural chest tube  Pulm toilet and nebs  Mobilize  [name] 11/17/2014 8:38 AM

## 2014-11-18 ENCOUNTER — Inpatient Hospital Stay (HOSPITAL_COMMUNITY): Payer: Commercial Managed Care - HMO | Admitting: Anesthesiology

## 2014-11-18 ENCOUNTER — Encounter (HOSPITAL_COMMUNITY): Payer: Self-pay | Admitting: Certified Registered Nurse Anesthetist

## 2014-11-18 ENCOUNTER — Inpatient Hospital Stay (HOSPITAL_COMMUNITY): Payer: Commercial Managed Care - HMO

## 2014-11-18 ENCOUNTER — Encounter (HOSPITAL_COMMUNITY)
Admission: EM | Disposition: A | Discharge status: Home / Self Care | Payer: Self-pay | Source: Home / Self Care | Attending: Thoracic Surgery (Cardiothoracic Vascular Surgery)

## 2014-11-18 HISTORY — PX: CARDIOVERSION: SHX1299

## 2014-11-18 LAB — GLUCOSE, CAPILLARY
Glucose-Capillary: 114 mg/dL — ABNORMAL HIGH (ref 70–99)
Glucose-Capillary: 117 mg/dL — ABNORMAL HIGH (ref 70–99)
Glucose-Capillary: 134 mg/dL — ABNORMAL HIGH (ref 70–99)

## 2014-11-18 LAB — HEPARIN LEVEL (UNFRACTIONATED)
Heparin Unfractionated: 0.36 IU/mL (ref 0.30–0.70)
Heparin Unfractionated: 0.47 IU/mL (ref 0.30–0.70)

## 2014-11-18 LAB — BASIC METABOLIC PANEL
Anion gap: 10 (ref 5–15)
BUN: 27 mg/dL — ABNORMAL HIGH (ref 6–23)
CO2: 21 mmol/L (ref 19–32)
Calcium: 8.7 mg/dL (ref 8.4–10.5)
Chloride: 107 mmol/L (ref 96–112)
Creatinine, Ser: 1.39 mg/dL — ABNORMAL HIGH (ref 0.50–1.10)
GFR calc Af Amer: 44 mL/min — ABNORMAL LOW (ref >90)
GFR calc non Af Amer: 38 mL/min — ABNORMAL LOW (ref >90)
Glucose, Bld: 117 mg/dL — ABNORMAL HIGH (ref 70–99)
Potassium: 3.8 mmol/L (ref 3.5–5.1)
Sodium: 138 mmol/L (ref 135–145)

## 2014-11-18 LAB — CBC
HCT: 30.7 % — ABNORMAL LOW (ref 36.0–46.0)
Hemoglobin: 10.3 g/dL — ABNORMAL LOW (ref 12.0–15.0)
MCH: 29.3 pg (ref 26.0–34.0)
MCHC: 33.6 g/dL (ref 30.0–36.0)
MCV: 87.5 fL (ref 78.0–100.0)
Platelets: 131 10*3/uL — ABNORMAL LOW (ref 150–400)
RBC: 3.51 MIL/uL — ABNORMAL LOW (ref 3.87–5.11)
RDW: 16.6 % — ABNORMAL HIGH (ref 11.5–15.5)
WBC: 11.2 10*3/uL — ABNORMAL HIGH (ref 4.0–10.5)

## 2014-11-18 SURGERY — CARDIOVERSION
Anesthesia: Monitor Anesthesia Care

## 2014-11-18 MED ORDER — AMIODARONE HCL 200 MG PO TABS
400.0000 mg | ORAL_TABLET | Freq: Two times a day (BID) | ORAL | Status: DC
  Administered 2014-11-18 – 2014-11-22 (×10): 400 mg via ORAL
  Filled 2014-11-18 (×14): qty 2

## 2014-11-18 MED ORDER — METOPROLOL TARTRATE 50 MG PO TABS
50.0000 mg | ORAL_TABLET | Freq: Three times a day (TID) | ORAL | Status: DC
  Administered 2014-11-18 (×2): 50 mg via ORAL
  Filled 2014-11-18 (×6): qty 1

## 2014-11-18 MED ORDER — FUROSEMIDE 10 MG/ML IJ SOLN
40.0000 mg | Freq: Once | INTRAMUSCULAR | Status: AC
  Administered 2014-11-18: 40 mg via INTRAVENOUS
  Filled 2014-11-18: qty 4

## 2014-11-18 MED ORDER — LIDOCAINE HCL (CARDIAC) 20 MG/ML IV SOLN
INTRAVENOUS | Status: DC | PRN
  Administered 2014-11-18: 40 mg via INTRAVENOUS

## 2014-11-18 MED ORDER — PROPOFOL 10 MG/ML IV BOLUS
INTRAVENOUS | Status: DC | PRN
  Administered 2014-11-18: 60 mg via INTRAVENOUS

## 2014-11-18 MED ORDER — POTASSIUM CHLORIDE 10 MEQ/50ML IV SOLN: 10.0000 meq | INTRAVENOUS | Status: DC

## 2014-11-18 MED ORDER — POTASSIUM CHLORIDE 10 MEQ/50ML IV SOLN
10.0000 meq | INTRAVENOUS | Status: AC
Start: 2014-11-18 — End: 2014-11-18
  Administered 2014-11-18 (×4): 10 meq via INTRAVENOUS
  Filled 2014-11-18 (×4): qty 50

## 2014-11-18 NOTE — Anesthesia Preprocedure Evaluation (Addendum)
Anesthesia Evaluation   Patient awake    Reviewed: Allergy & Precautions, NPO status , Patient's Chart, lab work & pertinent test results, reviewed documented beta blocker date and time   Airway Mallampati: II   Neck ROM: Full    Dental  (+) Teeth Intact, Dental Advisory Given   Pulmonary shortness of breath, asthma (inhalers) , pneumonia -, Current Smoker,  breath sounds clear to auscultation        Cardiovascular hypertension, + dysrhythmias Atrial Fibrillation Rhythm:Irregular  SP ACB, EF prior to surgery 45%   Neuro/Psych    GI/Hepatic   Endo/Other    Renal/GU      Musculoskeletal  (+) Arthritis -,   Abdominal (+) + obese,   Peds  Hematology   Anesthesia Other Findings   Reproductive/Obstetrics                           Anesthesia Physical Anesthesia Plan  ASA: III  Anesthesia Plan: MAC   Post-op Pain Management:    Induction: Intravenous  Airway Management Planned: Nasal Cannula  Additional Equipment:   Intra-op Plan:   Post-operative Plan:   Informed Consent: I have reviewed the patients History and Physical, chart, labs and discussed the procedure including the risks, benefits and alternatives for the proposed anesthesia with the patient or authorized representative who has indicated his/her understanding and acceptance.     Plan Discussed with:   Anesthesia Plan Comments:         Anesthesia Quick Evaluation

## 2014-11-18 NOTE — Progress Notes (Signed)
      [address]       [place] [address]             [phone]      Up in chair  Unsuccessful attempt at cardioversion earlier today  BP 130/92 mmHg  Pulse 104  Temp(Src) 97.8 F (36.6 C) (Oral)  Resp 13  Ht 5' 7.5" (1.715 m)  Wt 220 lb 3.8 oz (99.9 kg)  BMI 33.97 kg/m2  SpO2 99%   Intake/Output Summary (Last 24 hours) at 11/18/14 1813 Last data filed at 11/18/14 1200  Gross per 24 hour  Intake 618.07 ml  Output    550 ml  Net  68.07 ml    Heparin to stop this PM to pull EPW in AM  Continue current care  Aliany Fiorenza C. Roxan Hockey, MD [facility] [phone]

## 2014-11-18 NOTE — Anesthesia Postprocedure Evaluation (Signed)
  Anesthesia Post-op Note  Patient: [name] [name]  Procedure(s) Performed: Procedure(s): CARDIOVERSION (N/A)  Patient Location: PACU and SICU  Anesthesia Type:MAC  Level of Consciousness: awake  Airway and Oxygen Therapy: Patient Spontanous Breathing and Patient connected to nasal cannula oxygen  Post-op Pain: none  Post-op Assessment: Post-op Vital signs reviewed and Patient's Cardiovascular Status Stable  Post-op Vital Signs: Reviewed and stable  Last Vitals:  Filed Vitals:   11/18/14 0758  BP:   Pulse:   Temp: 36.7 C  Resp:     Complications: No apparent anesthesia complications

## 2014-11-18 NOTE — Transfer of Care (Signed)
Immediate Anesthesia Transfer of Care Note  Patient: [name] [name]  Procedure(s) Performed: Procedure(s): CARDIOVERSION (N/A)  Patient Location: ICU  Anesthesia Type:MAC  Level of Consciousness: awake, oriented, patient cooperative and lethargic  Airway & Oxygen Therapy: Patient Spontanous Breathing and Patient connected to nasal cannula oxygen  Post-op Assessment: Report given to RN, Post -op Vital signs reviewed and stable, Patient moving all extremities and Patient moving all extremities X 4  Post vital signs: Reviewed and stable  Last Vitals:  Filed Vitals:   11/18/14 0758  BP:   Pulse:   Temp: 36.7 C  Resp:     Complications: No apparent anesthesia complications

## 2014-11-18 NOTE — CV Procedure (Signed)
    CARDIOVERSION NOTE  Procedure: Electrical Cardioversion Indications:  Atrial Fibrillation  Procedure Details:  Consent: Risks of procedure as well as the alternatives and risks of each were explained to the (patient/caregiver).  Consent for procedure obtained.  Time Out: Verified patient identification, verified procedure, site/side was marked, verified correct patient position, special equipment/implants available, medications/allergies/relevent history reviewed, required imaging and test results available.  Performed  Patient placed on cardiac monitor, pulse oximetry, supplemental oxygen as necessary.  Sedation given: Propofol per anesthesia Pacer pads placed anterior and posterior chest.  Cardioverted 3 time(s).  Cardioverted at 150J, 200J, 200J biphasic.  Impression: Findings: Post procedure EKG shows: Atrial Fibrillation Complications: None Patient did tolerate procedure well.  Plan: 1. Unsuccessful DCCV. Continue amiodarone loading - consider repeat outpatient cardioversion attempt as an outpatient in a few weeks.  Time Spent Directly with the Patient:  30 minutes   [name] Casino, MD, [facility] Attending Cardiologist [facility]  [name] 11/18/2014, 11:21 AM

## 2014-11-18 NOTE — Progress Notes (Signed)
ANTICOAGULATION CONSULT NOTE - Follow Up Consult  Pharmacy Consult for Heparin Indication: atrial fibrillation  Allergies  Allergen Reactions  . Bee Venom Anaphylaxis  . Shrimp [Shellfish Allergy] Swelling    Patient Measurements: Height: 5' 7.5" (171.5 cm) Weight: 220 lb 3.8 oz (99.9 kg) IBW/kg (Calculated) : 62.75 Heparin Dosing Weight: 85 kg  Vital Signs: Temp: 97.8 F (36.6 C) (04/05 1601) Temp Source: Oral (04/05 1601) BP: 130/92 mmHg (04/05 1300) Pulse Rate: 104 (04/05 1300)  Labs:  Recent Labs  11/16/14 0350  11/17/14 0349  11/17/14 2005 11/18/14 0427 11/18/14 1556  HGB 9.8*  --  9.3*  --   --  10.3*  --   HCT 30.2*  --  28.3*  --   --  30.7*  --   PLT 121*  --  122*  --   --  131*  --   HEPARINUNFRC  --   < >  --   < > 0.19* 0.36 0.47  CREATININE 1.34*  --  1.55*  --   --  1.39*  --   < > = values in this interval not displayed.  Estimated Creatinine Clearance: 46.8 mL/min (by C-G formula based on Cr of 1.39).  Assessment: 69 yo F POD#4 from CABG re-started on Heparin for afib.  Pt had afib pre-op and was on heparin at that time as well.  Heparin level this evening remains therapeutic (HL 0.47 << 0.36, goal of 0.3-0.5). Hgb/Hct up today - no overt s/sx of bleeding noted.  Heparin consult is noted to be d/ced with a stop time added to the heparin drip at midnight tonight. Per discussion with Dr. Roxy Manns - the patient is getting pacer wirers removed on 4/6.   Goal of Therapy:  Heparin level 0.3-0.7 units/ml, but will aim for 0.3-0.5 post-op Monitor platelets by anticoagulation protocol: Yes   Plan:  1. Continue heparin at the current rate of 1800 units/hr (18 ml/hr) - stop time entered 2400. 2. Will not obtain any more heparin levels - will follow-up long-term anticoagulation plans.   Alycia Rossetti, PharmD, BCPS Clinical Pharmacist Pager: [phone] 11/18/2014 6:12 PM

## 2014-11-18 NOTE — Progress Notes (Signed)
[address]       [place] [address]             [phone]        CARDIOTHORACIC SURGERY PROGRESS NOTE   R4 Days Post-Op Procedure(s) (LRB): CORONARY ARTERY BYPASS GRAFTING (CABG)TIMES 2 USING LEFT INTERNAL MAMMARY ARTERY AND RIGHT SAPHENOUS VEIN HARVESTED ENDOSCOPICALLY (N/A) CLIPPING OF ATRIAL APPENDAGE (N/A) TRANSESOPHAGEAL ECHOCARDIOGRAM (TEE) (N/A)  Subjective: Had loose bowel movements off and on all night after having received laxatives yesterday.  Still dyspneic with any activity.  Objective: Vital signs: BP Readings from Last 1 Encounters:  11/18/14 140/99   Pulse Readings from Last 1 Encounters:  11/18/14 33   Resp Readings from Last 1 Encounters:  11/18/14 33   Temp Readings from Last 1 Encounters:  11/18/14 98 F (36.7 C) Oral    Hemodynamics:    Physical Exam:  Rhythm:   Afib  Breath sounds: Diminished at bases  Heart sounds:  irregular  Incisions:  Clean and dry  Abdomen:  Soft, non-distended, non-tender  Extremities:  Warm, well-perfused    Intake/Output from previous day: 04/04 0701 - 04/05 0700 In: 742.8 [I.V.:742.8] Out: 2150 [Urine:2100; Chest Tube:50] Intake/Output this shift:    Lab Results:  CBC: Recent Labs  11/17/14 0349 11/18/14 0427  WBC 16.7* 11.2*  HGB 9.3* 10.3*  HCT 28.3* 30.7*  PLT 122* 131*    BMET:  Recent Labs  11/17/14 0349 11/18/14 0427  NA 137 138  K 4.1 3.8  CL 106 107  CO2 23 21  GLUCOSE 161* 117*  BUN 21 27*  CREATININE 1.55* 1.39*  CALCIUM 8.5 8.7     CBG (last 3)   Recent Labs  11/17/14 1203 11/17/14 1624 11/17/14 2129  GLUCAP 117* 118* 127*    ABG    Component Value Date/Time   PHART 7.328* 11/14/2014 1910   PCO2ART 34.0* 11/14/2014 1910   PO2ART 68.0* 11/14/2014 1910   HCO3 17.9* 11/14/2014 1910   TCO2 15 11/15/2014 1714   ACIDBASEDEF 7.0* 11/14/2014 1910   O2SAT 92.0 11/14/2014 1910    CXR: PORTABLE CHEST - 1 VIEW  COMPARISON: Portable chest  x-ray of November 17, 2014  FINDINGS: The lungs are reasonably well inflated. The hemidiaphragms remain partially obscured. The cardiac silhouette remains enlarged. The pulmonary interstitial markings are more conspicuous today. There is no pneumothorax nor large pleural effusion. There are 8 intact sternal wires. A left atrial appendage clip is visible. The PICC line tip on the right projects over the midportion of the SVC. The left internal jugular Cordis sheath has been removed.  IMPRESSION: 1. CHF with mild pulmonary interstitial edema slightly more prominent today. There is a small right pleural effusion. 2. Interval removal of the left-sided Cordis sheath. The PICC line is in reasonable position radiographically.   Electronically Signed  By: David Martinique  On: 11/18/2014 07:51   Assessment/Plan: S/P Procedure(s) (LRB): CORONARY ARTERY BYPASS GRAFTING (CABG)TIMES 2 USING LEFT INTERNAL MAMMARY ARTERY AND RIGHT SAPHENOUS VEIN HARVESTED ENDOSCOPICALLY (N/A) CLIPPING OF ATRIAL APPENDAGE (N/A) TRANSESOPHAGEAL ECHOCARDIOGRAM (TEE) (N/A)  Remains in Afib, reasonably rate-controlled on IV amiodarone and oral metoprolol, although HR trending up Acute exacerbation of chronic diastolic CHF, primarily related to Afib - NEEDS AN ATTEMPT AT DCCV History of new-onset atrial fibrillation pre-op - probably not a good candidate for long term anticoagulation Expected post op acute blood loss anemia, stable Expected post op atelectasis, moderate Acute renal insufficiency, mild, creatinine down slightly 1.4  today - ? prerenal azotemia due to Afib and decreased perfusion vs acute kidney injury Expected post op volume excess, very mild, weight close to pre-op Post op leukocytosis, likely reactive, improving Hypertension Obesity Long standing tobacco abuse COPD   I think she would benefit from a trial DCCV - will keep NPO and arrange from today  Continue IV amiodarone and increase dose  oral metoprolol  Continue IV heparin  Continue intermittent lasix  Watch renal function  Pulm toilet and nebs  Mobilize  [name] 11/18/2014 8:17 AM

## 2014-11-18 NOTE — H&P (Signed)
     INTERVAL PROCEDURE H&P  History and Physical Interval Note:  11/18/2014 11:11 AM  [name] [name] has presented today for their planned procedure. The various methods of treatment have been discussed with the patient and family. After consideration of risks, benefits and other options for treatment, the patient has consented to the procedure.  The patients' outpatient history has been reviewed, patient examined, and no change in status from most recent office note within the past 30 days. I have reviewed the patients' chart and labs and will proceed as planned. Questions were answered to the patient's satisfaction.   [name], MD, [facility] Attending Cardiologist [facility]  Aloma Boch C 11/18/2014, 11:11 AM

## 2014-11-19 ENCOUNTER — Encounter (HOSPITAL_COMMUNITY): Payer: Self-pay | Admitting: Internal Medicine

## 2014-11-19 LAB — BASIC METABOLIC PANEL
Anion gap: 12 (ref 5–15)
BUN: 33 mg/dL — ABNORMAL HIGH (ref 6–23)
CO2: 21 mmol/L (ref 19–32)
Calcium: 8.7 mg/dL (ref 8.4–10.5)
Chloride: 104 mmol/L (ref 96–112)
Creatinine, Ser: 1.52 mg/dL — ABNORMAL HIGH (ref 0.50–1.10)
GFR calc Af Amer: 39 mL/min — ABNORMAL LOW (ref >90)
GFR calc non Af Amer: 34 mL/min — ABNORMAL LOW (ref >90)
Glucose, Bld: 120 mg/dL — ABNORMAL HIGH (ref 70–99)
Potassium: 3.5 mmol/L (ref 3.5–5.1)
Sodium: 137 mmol/L (ref 135–145)

## 2014-11-19 LAB — CBC
HCT: 26.7 % — ABNORMAL LOW (ref 36.0–46.0)
Hemoglobin: 8.9 g/dL — ABNORMAL LOW (ref 12.0–15.0)
MCH: 29 pg (ref 26.0–34.0)
MCHC: 33.3 g/dL (ref 30.0–36.0)
MCV: 87 fL (ref 78.0–100.0)
Platelets: 147 10*3/uL — ABNORMAL LOW (ref 150–400)
RBC: 3.07 MIL/uL — ABNORMAL LOW (ref 3.87–5.11)
RDW: 16.8 % — ABNORMAL HIGH (ref 11.5–15.5)
WBC: 9.5 10*3/uL (ref 4.0–10.5)

## 2014-11-19 LAB — GLUCOSE, CAPILLARY
Glucose-Capillary: 104 mg/dL — ABNORMAL HIGH (ref 70–99)
Glucose-Capillary: 102 mg/dL — ABNORMAL HIGH (ref 70–99)
Glucose-Capillary: 114 mg/dL — ABNORMAL HIGH (ref 70–99)
Glucose-Capillary: 118 mg/dL — ABNORMAL HIGH (ref 70–99)

## 2014-11-19 MED ORDER — POTASSIUM CHLORIDE 10 MEQ/50ML IV SOLN
10.0000 meq | INTRAVENOUS | Status: AC
  Administered 2014-11-19 (×3): 10 meq via INTRAVENOUS
  Filled 2014-11-19 (×5): qty 50

## 2014-11-19 MED ORDER — ATORVASTATIN CALCIUM 10 MG PO TABS
10.0000 mg | ORAL_TABLET | Freq: Every day | ORAL | Status: DC
  Administered 2014-11-19 – 2014-11-22 (×4): 10 mg via ORAL
  Filled 2014-11-19 (×7): qty 1

## 2014-11-19 MED ORDER — POTASSIUM CHLORIDE 10 MEQ/50ML IV SOLN
10.0000 meq | INTRAVENOUS | Status: AC
  Administered 2014-11-19 (×2): 10 meq via INTRAVENOUS

## 2014-11-19 MED ORDER — APIXABAN 5 MG PO TABS
5.0000 mg | ORAL_TABLET | Freq: Two times a day (BID) | ORAL | Status: DC
  Filled 2014-11-19 (×2): qty 1

## 2014-11-19 MED ORDER — METOPROLOL TARTRATE 25 MG PO TABS
25.0000 mg | ORAL_TABLET | Freq: Two times a day (BID) | ORAL | Status: DC
  Administered 2014-11-19 – 2014-11-23 (×7): 25 mg via ORAL
  Filled 2014-11-19 (×12): qty 1

## 2014-11-19 NOTE — Progress Notes (Signed)
DAILY PROGRESS NOTE  Subjective:  Back in sinus this am spontaneously.  Pacer wires out. Up in a chair.  Objective:   Temp:  [94.2 F (34.6 C)-98.1 F (36.7 C)] 98.1 F (36.7 C) (04/06 1241) Pulse Rate:  [44-106] 58 (04/06 1225) Resp:  [10-25] 20 (04/06 1225) BP: (84-172)/(29-127) 158/72 mmHg (04/06 1225) SpO2:  [87 %-100 %] 100 % (04/06 1225) Weight:  [219 lb 5.7 oz (99.5 kg)] 219 lb 5.7 oz (99.5 kg) (04/06 0500) Weight change: -14.1 oz (-0.4 kg)  Intake/Output from previous day: 04/05 0701 - 04/06 0700 In: 1450.2 [P.O.:1080; I.V.:370.2] Out: 1525 [Urine:1525]  Intake/Output from this shift: Total I/O In: 250 [IV Piggyback:250] Out: -   Medications: Current Facility-Administered Medications  Medication Dose Route Frequency Provider Last Rate Last Dose  . acetaminophen (TYLENOL) tablet 1,000 mg  1,000 mg Oral 4 times per day [name], MD   1,000 mg at 11/19/14 1223  . albuterol (PROVENTIL) (2.5 MG/3ML) 0.083% nebulizer solution 2.5 mg  2.5 mg Nebulization Q4H PRN [name], MD      . amiodarone (PACERONE) tablet 400 mg  400 mg Oral BID [name], MD   400 mg at 11/19/14 1028  . aspirin EC tablet 325 mg  325 mg Oral Daily [name], MD   325 mg at 11/19/14 1027  . atorvastatin (LIPITOR) tablet 10 mg  10 mg Oral q1800 [name], MD      . bisacodyl (DULCOLAX) EC tablet 10 mg  10 mg Oral Daily [name], MD   10 mg at 11/17/14 0947   Or  . bisacodyl (DULCOLAX) suppository 10 mg  10 mg Rectal Daily [name], MD      . docusate sodium (COLACE) capsule 200 mg  200 mg Oral Daily [name], MD   200 mg at 11/17/14 0962  . insulin aspart (novoLOG) injection 0-15 Units  0-15 Units Subcutaneous TID WC [name], MD   2 Units at 11/16/14 1622  . metoprolol (LOPRESSOR) injection 2.5-5 mg  2.5-5 mg Intravenous Q2H PRN [name], MD      . metoprolol (LOPRESSOR) tablet 50 mg  50 mg Oral TID [name], MD   50 mg at  11/18/14 1650  . ondansetron (ZOFRAN) injection 4 mg  4 mg Intravenous Q6H PRN [name], MD      . oxyCODONE (Oxy IR/ROXICODONE) immediate release tablet 5-10 mg  5-10 mg Oral Q3H PRN [name], MD   10 mg at 11/18/14 2024  . pantoprazole (PROTONIX) EC tablet 40 mg  40 mg Oral Daily [name], MD   40 mg at 11/19/14 1027  . potassium chloride 10 mEq in 50 mL *CENTRAL LINE* IVPB  10 mEq Intravenous Q1 Hr x 2 [name], MD   10 mEq at 11/19/14 1230  . sodium chloride 0.9 % injection 10-40 mL  10-40 mL Intracatheter Q12H [name], MD   30 mL at 11/19/14 1000  . sodium chloride 0.9 % injection 10-40 mL  10-40 mL Intracatheter PRN [name], MD      . sodium chloride 0.9 % injection 3 mL  3 mL Intravenous Q12H [name], MD   3 mL at 11/18/14 2300  . sodium chloride 0.9 % injection 3 mL  3 mL Intravenous PRN [name], MD      . traMADol Veatrice Bourbon) tablet 50-100 mg  50-100 mg Oral Q4H PRN [name], MD   100 mg at 11/18/14 0093    Physical Exam: General appearance: alert and no distress Neck: no carotid bruit and no JVD Lungs: clear to auscultation bilaterally Heart: irregularly irregular rhythm and no murmur Abdomen: soft, non-tender; bowel sounds normal; no masses,  no organomegaly Extremities: extremities normal, atraumatic, no cyanosis or edema Pulses: 2+ and symmetric Skin: Skin color, texture, turgor normal. No rashes or lesions Neurologic: Mental status: Alert and oriented, somewhat slow reactions, but following commands Psych: Pleasant  Lab Results: Results for orders placed or performed during the hospital encounter of 10/29/14 (from the past 48 hour(s))  Glucose, capillary     Status: Abnormal   Collection Time: 11/17/14  4:24 PM  Result Value Ref Range   Glucose-Capillary 118 (H) 70 - 99 mg/dL   Comment 1 Capillary Specimen    Comment 2 Notify RN   Heparin level (unfractionated)     Status: Abnormal   Collection Time:  11/17/14  8:05 PM  Result Value Ref Range   Heparin Unfractionated 0.19 (L) 0.30 - 0.70 IU/mL    Comment:        IF HEPARIN RESULTS ARE BELOW EXPECTED VALUES, AND PATIENT DOSAGE HAS BEEN CONFIRMED, SUGGEST FOLLOW UP TESTING OF ANTITHROMBIN III LEVELS.   Glucose, capillary     Status: Abnormal   Collection Time: 11/17/14  9:29 PM  Result Value Ref Range   Glucose-Capillary 127 (H) 70 - 99 mg/dL  CBC     Status: Abnormal   Collection Time: 11/18/14  4:27 AM  Result Value Ref Range   WBC 11.2 (H) 4.0 - 10.5 K/uL   RBC 3.51 (L) 3.87 - 5.11 MIL/uL   Hemoglobin 10.3 (L) 12.0 - 15.0 g/dL   HCT 30.7 (L) 36.0 - 46.0 %   MCV 87.5 78.0 - 100.0 fL   MCH 29.3 26.0 - 34.0 pg   MCHC 33.6 30.0 - 36.0 g/dL   RDW 16.6 (H) 11.5 - 15.5 %   Platelets 131 (L) 150 - 400 K/uL  Basic metabolic panel     Status: Abnormal   Collection Time: 11/18/14  4:27 AM  Result Value Ref Range   Sodium 138 135 - 145 mmol/L   Potassium 3.8 3.5 - 5.1 mmol/L   Chloride 107 96 - 112 mmol/L   CO2 21 19 - 32 mmol/L   Glucose, Bld 117 (H) 70 - 99 mg/dL   BUN 27 (H) 6 - 23 mg/dL   Creatinine, Ser 1.39 (H) 0.50 - 1.10 mg/dL   Calcium 8.7 8.4 - 10.5 mg/dL   GFR calc non Af Amer 38 (L) >90 mL/min   GFR calc Af Amer 44 (L) >90 mL/min    Comment: (NOTE) The eGFR has been calculated using the CKD EPI equation. This calculation has not been validated in all clinical situations. eGFR's persistently <90 mL/min signify possible Chronic Kidney Disease.    Anion gap 10 5 - 15  Heparin level (unfractionated)     Status: None   Collection Time: 11/18/14  4:27 AM  Result Value Ref Range   Heparin Unfractionated 0.36 0.30 - 0.70 IU/mL    Comment:        IF HEPARIN RESULTS ARE BELOW EXPECTED VALUES, AND PATIENT DOSAGE HAS BEEN CONFIRMED, SUGGEST FOLLOW UP TESTING OF ANTITHROMBIN III LEVELS.   Glucose, capillary     Status: Abnormal   Collection Time: 11/18/14  7:57 AM  Result Value Ref Range  Glucose-Capillary 114 (H)  70 - 99 mg/dL   Comment 1 Capillary Specimen    Comment 2 Notify RN   Glucose, capillary     Status: Abnormal   Collection Time: 11/18/14 12:23 PM  Result Value Ref Range   Glucose-Capillary 117 (H) 70 - 99 mg/dL   Comment 1 Capillary Specimen    Comment 2 Notify RN   Heparin level (unfractionated)     Status: None   Collection Time: 11/18/14  3:56 PM  Result Value Ref Range   Heparin Unfractionated 0.47 0.30 - 0.70 IU/mL    Comment:        IF HEPARIN RESULTS ARE BELOW EXPECTED VALUES, AND PATIENT DOSAGE HAS BEEN CONFIRMED, SUGGEST FOLLOW UP TESTING OF ANTITHROMBIN III LEVELS.   Glucose, capillary     Status: Abnormal   Collection Time: 11/18/14  3:59 PM  Result Value Ref Range   Glucose-Capillary 104 (H) 70 - 99 mg/dL   Comment 1 Capillary Specimen    Comment 2 Notify RN   Glucose, capillary     Status: Abnormal   Collection Time: 11/18/14  9:47 PM  Result Value Ref Range   Glucose-Capillary 134 (H) 70 - 99 mg/dL   Comment 1 Capillary Specimen    Comment 2 Notify RN    Comment 3 Document in Chart   Basic metabolic panel     Status: Abnormal   Collection Time: 11/19/14  4:25 AM  Result Value Ref Range   Sodium 137 135 - 145 mmol/L   Potassium 3.5 3.5 - 5.1 mmol/L   Chloride 104 96 - 112 mmol/L   CO2 21 19 - 32 mmol/L   Glucose, Bld 120 (H) 70 - 99 mg/dL   BUN 33 (H) 6 - 23 mg/dL   Creatinine, Ser 1.52 (H) 0.50 - 1.10 mg/dL   Calcium 8.7 8.4 - 10.5 mg/dL   GFR calc non Af Amer 34 (L) >90 mL/min   GFR calc Af Amer 39 (L) >90 mL/min    Comment: (NOTE) The eGFR has been calculated using the CKD EPI equation. This calculation has not been validated in all clinical situations. eGFR's persistently <90 mL/min signify possible Chronic Kidney Disease.    Anion gap 12 5 - 15  CBC     Status: Abnormal   Collection Time: 11/19/14  4:25 AM  Result Value Ref Range   WBC 9.5 4.0 - 10.5 K/uL   RBC 3.07 (L) 3.87 - 5.11 MIL/uL   Hemoglobin 8.9 (L) 12.0 - 15.0 g/dL   HCT 26.7  (L) 36.0 - 46.0 %   MCV 87.0 78.0 - 100.0 fL   MCH 29.0 26.0 - 34.0 pg   MCHC 33.3 30.0 - 36.0 g/dL   RDW 16.8 (H) 11.5 - 15.5 %   Platelets 147 (L) 150 - 400 K/uL  Glucose, capillary     Status: Abnormal   Collection Time: 11/19/14  8:02 AM  Result Value Ref Range   Glucose-Capillary 102 (H) 70 - 99 mg/dL   Comment 1 Capillary Specimen    Comment 2 Notify RN     Imaging: Dg Chest Port 1 View  11/18/2014   CLINICAL DATA:  Status post CABG 4 days ago  EXAM: PORTABLE CHEST - 1 VIEW  COMPARISON:  Portable chest x-ray of November 17, 2014  FINDINGS: The lungs are reasonably well inflated. The hemidiaphragms remain partially obscured. The cardiac silhouette remains enlarged. The pulmonary interstitial markings are more conspicuous today. There is no pneumothorax nor large  pleural effusion. There are 8 intact sternal wires. A left atrial appendage clip is visible. The PICC line tip on the right projects over the midportion of the SVC. The left internal jugular Cordis sheath has been removed.  IMPRESSION: 1. CHF with mild pulmonary interstitial edema slightly more prominent today. There is a small right pleural effusion. 2. Interval removal of the left-sided Cordis sheath. The PICC line is in reasonable position radiographically.   Electronically Signed   By: David  Martinique   On: 11/18/2014 07:51   Dg Chest Port 1 View  11/17/2014   CLINICAL DATA:  Status post right PICC placement  EXAM: PORTABLE CHEST - 1 VIEW  COMPARISON:  11/17/2014  FINDINGS: A right-sided PICC line is now identified. Catheter tip is noted in the distal superior vena cava in adequate position. A left jugular central venous sheath is again noted and stable. The cardiac shadow is enlarged but stable. Postsurgical changes are noted. Increased density is noted in the right lung base consistent with pleural effusion.  IMPRESSION: Status post right PICC line placement in satisfactory position.  The remainder of the exam is stable.    Electronically Signed   By: Inez Catalina M.D.   On: 11/17/2014 16:37    Assessment:  Principal Problem:   S/P CABG x 2 with clipping of LA appendage Active Problems:   HTN (hypertension)   Atrial fibrillation   CAP (community acquired pneumonia)   Cardiomyopathy   Hypokalemia   Acute on chronic combined systolic and diastolic HF (heart failure)   NSTEMI (non-ST elevated myocardial infarction)   SOB (shortness of breath)   Atrial fibrillation with RVR   PEA (Pulseless electrical activity)   Acute respiratory failure with hypoxemia   Anoxic brain injury   Essential hypertension   Cardiac arrest   Cardiomyopathy, ischemic   Coronary artery disease due to lipid rich plaque   Accelerated hypertension   Left main coronary artery disease   Plan:  1. Cardiac arrest - Ms. Brilliant had a brady-asystolic arrest. She underwent cardiac catheterization which revealed 80% LM stenosis and multivessel CAD. CT surgery was consulted and she underwent CABG and left atrial appendage exclusion. She is recovering well. 2. A-fib with RVR - intermittent - just recurred last night. Switched to po amiodarone 400 mg BID for 1 week, then decrease to 400 mg daily. Did not cardiovert yesterday, but spontaneously converted overnight to sinus. Agree with starting DOAC tomorrow- probably Eliquis per pharmacy - may need 2.5 mg dose given GFR. 3. Cardiomyopathy - clear ischemic etiology. LVEF 35-40% - hopefully will improve with CABG.  Bradycardia limits ability to use b-blocker. Would recommend we decrease this to 25 mg BID. Renal function and BP will not allow ACE-I or ARB.  Time Spent Directly with Patient: 15 minutes  Length of Stay:  LOS: 20 days   [name], MD, [facility] Attending Cardiologist [facility]  , C 11/19/2014, 1:23 PM

## 2014-11-19 NOTE — Progress Notes (Signed)
ANTICOAGULATION CONSULT NOTE - Follow Up Consult  Pharmacy Consult for apixaban Indication: atrial fibrillation  Allergies  Allergen Reactions  . Bee Venom Anaphylaxis  . Shrimp [Shellfish Allergy] Swelling    Patient Measurements: Height: 5' 7.5" (171.5 cm) Weight: 219 lb 5.7 oz (99.5 kg) IBW/kg (Calculated) : 62.75 Heparin Dosing Weight: 85 kg  Vital Signs: Temp: 97.5 F (36.4 C) (04/06 1403) Temp Source: Oral (04/06 1403) BP: 142/66 mmHg (04/06 1403) Pulse Rate: 56 (04/06 1403)  Labs:  Recent Labs  11/17/14 0349  11/17/14 2005 11/18/14 0427 11/18/14 1556 11/19/14 0425  HGB 9.3*  --   --  10.3*  --  8.9*  HCT 28.3*  --   --  30.7*  --  26.7*  PLT 122*  --   --  131*  --  147*  HEPARINUNFRC  --   < > 0.19* 0.36 0.47  --   CREATININE 1.55*  --   --  1.39*  --  1.52*  < > = values in this interval not displayed.  Estimated Creatinine Clearance: 42.7 mL/min (by C-G formula based on Cr of 1.52).  Assessment: 69 yo F POD#5 from CABG with intermittent Afib. She had been on IV heparin which was discontinued yesterday. Pharmacy consulted to begin apixaban on 4/7. Although SCr is elevated, patient still meets criteria for regular apixaban dosing (age <55 y/o and weight > 60kg). No bleeding noted, Hb 8.9 < 10.3, platelets have trended up.  Goal of Therapy:  Therapeutic anticoagulation Monitor platelets by anticoagulation protocol: Yes   Plan:  Apixaban 5 mg PO bid - to begin 4/7 Consider decreasing aspirin to 81 mg Monitor for s/sx of bleeding  [facility], Marlin.D., BCPS Clinical Pharmacist Pager: [phone] 11/19/2014 2:22 PM

## 2014-11-19 NOTE — Progress Notes (Signed)
      [address]       [place] [address]             [phone]        CARDIOTHORACIC SURGERY PROGRESS NOTE   R1 Day Post-Op Procedure(s) (LRB): CARDIOVERSION (N/A)  Subjective: Feels better.  Dyspnea improved.  Very mild pain.  Objective: Vital signs: BP Readings from Last 1 Encounters:  11/19/14 154/94   Pulse Readings from Last 1 Encounters:  11/19/14 58   Resp Readings from Last 1 Encounters:  11/19/14 17   Temp Readings from Last 1 Encounters:  11/19/14 98 F (36.7 C) Oral    Hemodynamics:    Physical Exam:  Rhythm:   Afib overnight - spontaneously converted to sinus rhythm this morning  Breath sounds: Few bibasilar crackles  Heart sounds:  RRR  Incisions:  Clean and dry  Abdomen:  Soft, non-distended, non-tender  Extremities:  Warm, well-perfused    Intake/Output from previous day: 04/05 0701 - 04/06 0700 In: 1450.2 [P.O.:1080; I.V.:370.2] Out: 1525 [Urine:1525] Intake/Output this shift: Total I/O In: 50 [IV Piggyback:50] Out: -   Lab Results:  CBC: Recent Labs  11/18/14 0427 11/19/14 0425  WBC 11.2* 9.5  HGB 10.3* 8.9*  HCT 30.7* 26.7*  PLT 131* 147*    BMET:  Recent Labs  11/18/14 0427 11/19/14 0425  NA 138 137  K 3.8 3.5  CL 107 104  CO2 21 21  GLUCOSE 117* 120*  BUN 27* 33*  CREATININE 1.39* 1.52*  CALCIUM 8.7 8.7     CBG (last 3)   Recent Labs  11/18/14 1559 11/18/14 2147 11/19/14 0802  GLUCAP 104* 134* 102*    ABG    Component Value Date/Time   PHART 7.328* 11/14/2014 1910   PCO2ART 34.0* 11/14/2014 1910   PO2ART 68.0* 11/14/2014 1910   HCO3 17.9* 11/14/2014 1910   TCO2 15 11/15/2014 1714   ACIDBASEDEF 7.0* 11/14/2014 1910   O2SAT 92.0 11/14/2014 1910    CXR: n/a  Assessment/Plan: S/P Procedure(s) (LRB): CARDIOVERSION (N/A)  Overall stable POD5 New onset atrial fibrillation present at time of initial presentation, persistent AF post-op failed DCCV yesterday but then  spontaneously converted to NSR this am Acute exacerbation of chronic diastolic CHF, improved Expected post op acute blood loss anemia, stable Expected post op atelectasis, moderate Acute renal insufficiency, mild, creatinine up slightly 1.5 today - ? prerenal azotemia due to Afib and decreased perfusion vs acute kidney injury Expected post op volume excess, very mild, weight close to pre-op Hypokalemia - likely diuretic-induced Post op leukocytosis, resolved Hypertension Obesity Long standing tobacco abuse COPD   Continue oral amiodarone and metoprolol  Heparin on hold - d/c pacing wires today  Start NOAC tomorrow  Continue intermittent lasix  Watch renal function  Pulm toilet and nebs  Mobilize  Supplement potassium  Transfer step down  [name] 11/19/2014 9:01 AM

## 2014-11-19 NOTE — Progress Notes (Signed)
Consult was placed for Chaplain visit.   Chaplain visited with [name] [name]. [name] [name] presented as conversational and employed humor the duration of our chat.   She noted that she was feeling sore and felt like her food wasn't digesting.   During our conversation she expressed being glad to be alive because she doesn't remember any of the events that led her to the hospital. "Apparantly I went out *snaps* like that. I don't know who called my family and everything but they showed up from everywhere, they were scared for me". She has local family in the area and close by who have visited and support her.   She requested that I leave my ecclesiastical information with her before the conclusion of our visit. Page if needed.   Delford Field, Chaplain 11/19/2014

## 2014-11-20 LAB — GLUCOSE, CAPILLARY
Glucose-Capillary: 104 mg/dL — ABNORMAL HIGH (ref 70–99)
Glucose-Capillary: 110 mg/dL — ABNORMAL HIGH (ref 70–99)
Glucose-Capillary: 133 mg/dL — ABNORMAL HIGH (ref 70–99)
Glucose-Capillary: 120 mg/dL — ABNORMAL HIGH (ref 70–99)
Glucose-Capillary: 132 mg/dL — ABNORMAL HIGH (ref 70–99)

## 2014-11-20 LAB — BASIC METABOLIC PANEL
Anion gap: 10 (ref 5–15)
BUN: 30 mg/dL — ABNORMAL HIGH (ref 6–23)
CO2: 20 mmol/L (ref 19–32)
Calcium: 8.6 mg/dL (ref 8.4–10.5)
Chloride: 109 mmol/L (ref 96–112)
Creatinine, Ser: 1.43 mg/dL — ABNORMAL HIGH (ref 0.50–1.10)
GFR calc Af Amer: 42 mL/min — ABNORMAL LOW (ref >90)
GFR calc non Af Amer: 36 mL/min — ABNORMAL LOW (ref >90)
Glucose, Bld: 122 mg/dL — ABNORMAL HIGH (ref 70–99)
Potassium: 4.8 mmol/L (ref 3.5–5.1)
Sodium: 139 mmol/L (ref 135–145)

## 2014-11-20 MED ORDER — ENSURE ENLIVE PO LIQD
237.0000 mL | Freq: Two times a day (BID) | ORAL | Status: DC
  Administered 2014-11-20 – 2014-11-23 (×6): 237 mL via ORAL

## 2014-11-20 MED ORDER — APIXABAN 2.5 MG PO TABS
2.5000 mg | ORAL_TABLET | Freq: Two times a day (BID) | ORAL | Status: DC
  Administered 2014-11-20 – 2014-11-23 (×7): 2.5 mg via ORAL
  Filled 2014-11-20 (×8): qty 1

## 2014-11-20 MED ORDER — ASPIRIN EC 81 MG PO TBEC
81.0000 mg | DELAYED_RELEASE_TABLET | Freq: Every day | ORAL | Status: DC
  Administered 2014-11-20 – 2014-11-23 (×4): 81 mg via ORAL
  Filled 2014-11-20 (×4): qty 1

## 2014-11-20 NOTE — Progress Notes (Signed)
Medicare Important Message given? YES  (If response is "NO", the following Medicare IM given date fields will be blank)  Date Medicare IM given: 11/20/14 Medicare IM given by:  Jamarious Febo  

## 2014-11-20 NOTE — Progress Notes (Addendum)
      [address]       [place] [address]             [phone]      2 Days Post-Op Procedure(s) (LRB): CARDIOVERSION (N/A)   Subjective:  [name] [name] complains of chest heaviness.  She states this was present yesterday and has improved some today.  She is concerned regarding start of a new medication.  She was provided information on Eliquis and I explained the benefits and risks of this medication.  She understands and is agreeable to take medication.  Objective: Vital signs in last 24 hours: Temp:  [97.4 F (36.3 C)-98.1 F (36.7 C)] 97.4 F (36.3 C) (04/07 0452) Pulse Rate:  [55-62] 60 (04/07 0452) Cardiac Rhythm:  [-] Sinus bradycardia;Normal sinus rhythm (04/06 1930) Resp:  [16-24] 16 (04/07 0452) BP: (122-172)/(56-102) 141/56 mmHg (04/07 0452) SpO2:  [95 %-100 %] 100 % (04/07 0452) Weight:  [217 lb 14.4 oz (98.839 kg)] 217 lb 14.4 oz (98.839 kg) (04/07 0452)  Intake/Output from previous day: 04/06 0701 - 04/07 0700 In: 550 [P.O.:240; I.V.:60; IV Piggyback:250] Out: 450 [Urine:450]  General appearance: alert, cooperative and no distress Heart: regular rate and rhythm Lungs: clear to auscultation bilaterally Abdomen: soft, non-tender; bowel sounds normal; no masses,  no organomegaly Extremities: edema trace Wound: clean and dry  Lab Results:  Recent Labs  11/18/14 0427 11/19/14 0425  WBC 11.2* 9.5  HGB 10.3* 8.9*  HCT 30.7* 26.7*  PLT 131* 147*   BMET:  Recent Labs  11/19/14 0425 11/20/14 0425  NA 137 139  K 3.5 4.8  CL 104 109  CO2 21 20  GLUCOSE 120* 122*  BUN 33* 30*  CREATININE 1.52* 1.43*  CALCIUM 8.7 8.6    PT/INR: No results for input(s): LABPROT, INR in the last 72 hours. ABG    Component Value Date/Time   PHART 7.328* 11/14/2014 1910   HCO3 17.9* 11/14/2014 1910   TCO2 15 11/15/2014 1714   ACIDBASEDEF 7.0* 11/14/2014 1910   O2SAT 92.0 11/14/2014 1910   CBG (last 3)   Recent Labs  11/19/14 1630  11/19/14 2107 11/20/14 0621  GLUCAP 114* 118* 110*    Assessment/Plan: S/P Procedure(s) (LRB): CARDIOVERSION (N/A)  1. CV- Previous A. Fib, failed cardioversion, however converted and is maintaining NSR this morning- continue Lopressor, start Eliquis per pharmacy 2. Pulm-off oxygen, encouraged use of IS 3. Renal- creatinine remains elevated at 1.43, weight is almost at baseline, Hypokalemia resolved, will defer Lasix dose to Dr. Roxy [name] 4. CBGs controlled, patient is not a diabetic will d/c SSIP 5. Dispo- patient maintaining NSR, Eliquis ordered, patient needs to ambulate, possibly home this weekend   LOS: 21 days    [name], [name] 11/20/2014  I have seen and examined the patient and agree with the assessment and plan as outlined.  To my knowledge full dose Eliquis has NOT been documented to be safe this early after open heart surgery, particularly when given in conjunction with ASA.  Will decrease ASA to 81 mg/day and decrease Eliquis to 2.5 mg bid.  She has a clip on LA appendage which by itself should dramatically decrease the risk of LA thrombus formation, embolization and stroke.  [name] [name] 11/20/2014 8:38 AM

## 2014-11-20 NOTE — Progress Notes (Signed)
NUTRITION FOLLOW UP  Intervention:   -Provide Ensure Enlive po BID, each supplement provides 350 kcal and 20 grams of protein  Nutrition Dx:   Inadequate oral intake now related to dysphagia and taste changes as evidenced by meal completion < 50%; ongoing  Goal:   Pt to meet >/= 90% of their estimated nutrition needs; progressing  Monitor:   PO intake, supplement acceptance, weight trends, labs, I/O's  Assessment:   Pt admitted with new onset AFib with RVR. Pt scheduled for LHC on 3/18 but refused, rescheduled for 3/21 pt had SOB and progressed to cardiac arrest and intubated.  Pt extubated 3/25 and started on dysphagia diet 3/26 per SLP.   S/P CABG 11/14/14. Pt reports poor appetite which she relates to being constipated. Per nursing notes pt is eating 15% to 60% of meals. Her weight remains stable. Discussed diet tips for constipation. Pt would like to receive Ensure Enlive supplements again.   Labs reviewed. Low GFR, low hemoglobin  Height: Ht Readings from Last 1 Encounters:  11/11/14 5' 7.5" (1.715 m)    Weight Status:   Wt Readings from Last 1 Encounters:  11/20/14 217 lb 14.4 oz (98.839 kg)   11/10/14 220 lb 10.9 oz (100.1 kg)  Admission weight: 215 lb (97.7 kg) 3/16     Re-estimated needs:  Kcal: 1800-2000 Protein: 95-105 grams Fluid: 1.8-2.0 L  Skin: closed incisions on leg and sternum  Diet Order: Diet Heart Room service appropriate?: Yes; Fluid consistency:: Thin   Intake/Output Summary (Last 24 hours) at 11/20/14 1547 Last data filed at 11/20/14 1300  Gross per 24 hour  Intake    780 ml  Output    650 ml  Net    130 ml    Last BM: 4/4   Labs:   Recent Labs Lab 11/14/14 1850 11/15/14 0357 11/15/14 1700  11/18/14 0427 11/19/14 0425 11/20/14 0425  NA  --  140  --   < > 138 137 139  K  --  4.7  --   < > 3.8 3.5 4.8  CL  --  114*  --   < > 107 104 109  CO2  --  21  --   < > 21 21 20   BUN  --  12  --   < > 27* 33* 30*  CREATININE 1.09  1.35* 0.65  < > 1.39* 1.52* 1.43*  CALCIUM  --  8.2*  --   < > 8.7 8.7 8.6  MG 2.7* 2.2 1.0*  --   --   --   --   GLUCOSE  --  130*  --   < > 117* 120* 122*  < > = values in this interval not displayed.  CBG (last 3)   Recent Labs  11/19/14 2107 11/20/14 0621 11/20/14 1110  GLUCAP 118* 110* 133*    Scheduled Meds: . amiodarone  400 mg Oral BID  . apixaban  2.5 mg Oral BID  . aspirin EC  81 mg Oral Daily  . atorvastatin  10 mg Oral q1800  . bisacodyl  10 mg Oral Daily   Or  . bisacodyl  10 mg Rectal Daily  . docusate sodium  200 mg Oral Daily  . metoprolol tartrate  25 mg Oral BID  . pantoprazole  40 mg Oral Daily  . sodium chloride  10-40 mL Intracatheter Q12H  . sodium chloride  3 mL Intravenous Q12H    Continuous Infusions:    Pryor Ochoa RD,  LDN Inpatient Clinical Dietitian Pager: [phone] After Hours Pager: [phone]

## 2014-11-20 NOTE — Progress Notes (Signed)
Began education on amiodarone and metoprolol. Pt verbalized understanding. Education sheets will be given in the am and reviewed.   Will continue to monitor.   Earlie Lou

## 2014-11-20 NOTE — Progress Notes (Signed)
Agree with ASA 81 mg - Eliquis 2.5 mg BID for now - GFR in the 30's supports this - if renal function improves, she may need to be increased to 5 mg BID. No clear data on duration of treatment - probably 1-3 months should be sufficient and we could consider taking her off of anticoagulation after that time.  [name], MD, [facility] Attending Cardiologist [place]

## 2014-11-21 LAB — GLUCOSE, CAPILLARY
Glucose-Capillary: 126 mg/dL — ABNORMAL HIGH (ref 70–99)
Glucose-Capillary: 111 mg/dL — ABNORMAL HIGH (ref 70–99)
Glucose-Capillary: 135 mg/dL — ABNORMAL HIGH (ref 70–99)
Glucose-Capillary: 109 mg/dL — ABNORMAL HIGH (ref 70–99)

## 2014-11-21 MED ORDER — FUROSEMIDE 40 MG PO TABS
40.0000 mg | ORAL_TABLET | Freq: Once | ORAL | Status: AC
  Administered 2014-11-21: 40 mg via ORAL
  Filled 2014-11-21: qty 1

## 2014-11-21 MED ORDER — LOSARTAN POTASSIUM 25 MG PO TABS
25.0000 mg | ORAL_TABLET | Freq: Every day | ORAL | Status: DC
  Administered 2014-11-21: 25 mg via ORAL
  Filled 2014-11-21 (×2): qty 1

## 2014-11-21 NOTE — Progress Notes (Addendum)
      [address]       [place] [address]             [phone]      3 Days Post-Op Procedure(s) (LRB): CARDIOVERSION (N/A)   Subjective:  [name] [name] complains she feels like her feet are swollen.  She states she feels she is doing pretty good.  She is ambulating.  She has not had a BM.  Objective: Vital signs in last 24 hours: Temp:  [97.7 F (36.5 C)-98.2 F (36.8 C)] 98.2 F (36.8 C) (04/08 0416) Pulse Rate:  [59-65] 65 (04/08 0416) Cardiac Rhythm:  [-] Normal sinus rhythm (04/07 2000) Resp:  [18-20] 20 (04/08 0416) BP: (124-162)/(62-75) 162/66 mmHg (04/08 0416) SpO2:  [97 %-100 %] 97 % (04/08 0416) Weight:  [218 lb 1.6 oz (98.93 kg)] 218 lb 1.6 oz (98.93 kg) (04/08 0416)  Intake/Output from previous day: 04/07 0701 - 04/08 0700 In: 960 [P.O.:960] Out: 200 [Urine:200]  General appearance: alert, cooperative and no distress Heart: regular rate and rhythm Lungs: clear to auscultation bilaterally Abdomen: soft, non-tender; bowel sounds normal; no masses,  no organomegaly Extremities: edema trace Wound: clean and dry  Lab Results:  Recent Labs  11/19/14 0425  WBC 9.5  HGB 8.9*  HCT 26.7*  PLT 147*   BMET:  Recent Labs  11/19/14 0425 11/20/14 0425  NA 137 139  K 3.5 4.8  CL 104 109  CO2 21 20  GLUCOSE 120* 122*  BUN 33* 30*  CREATININE 1.52* 1.43*  CALCIUM 8.7 8.6    PT/INR: No results for input(s): LABPROT, INR in the last 72 hours. ABG    Component Value Date/Time   PHART 7.328* 11/14/2014 1910   HCO3 17.9* 11/14/2014 1910   TCO2 15 11/15/2014 1714   ACIDBASEDEF 7.0* 11/14/2014 1910   O2SAT 92.0 11/14/2014 1910   CBG (last 3)   Recent Labs  11/20/14 1625 11/20/14 2128 11/21/14 0558  GLUCAP 120* 132* 126*    Assessment/Plan: S/P Procedure(s) (LRB): CARDIOVERSION (N/A)  1.CV- previous A.Fib, maintaining NSR, + hypertension this morning- will continue Lopressor, start low dose Cozaar, continue Eliquis 2. Pulm-  off oxygen, no acute issue, encouraged use of IS 3. Renal- weight is stable, up about 4 lbs since surgery, minimal LE edema, however patient complains of feeling swollen will give Lasix today 4. LOC constipation- will order Lactulose 5. Dispo- patient stable, will monitor pressure today with addition of Cozaar, ambulate, home in AM if remains stable   LOS: 22 days    [name], [name] 11/21/2014  I have seen and examined the patient and agree with the assessment and plan as outlined.  Possible d/c home in 1-2 days if she continues to improve.  [name] 11/21/2014 8:49 AM

## 2014-11-21 NOTE — Progress Notes (Signed)
Agree with additional lasix today. Maintaining sinus. Add losartan for hypertension and cardiomyopathy benefit. On eliquis and BID amiodarone - decrease to 400 mg daily after 1 week total.  [name], MD, [facility] Attending Cardiologist [place]

## 2014-11-21 NOTE — Discharge Summary (Signed)
Physician Discharge Summary  Patient ID: [name] [name] MRN: [id number] DOB/AGE: Feb 03, 1946 69 y.o.  Admit date: 10/29/2014 Discharge date: 11/23/2014  Admission Diagnoses:  Patient Active Problem List   Diagnosis Date Noted  . Accelerated hypertension   . Cardiac arrest 11/04/2014  . Left main coronary artery disease 11/04/2014  . Cardiomyopathy, ischemic   . Coronary artery disease due to lipid rich plaque   . PEA (Pulseless electrical activity)   . Acute respiratory failure with hypoxemia   . Anoxic brain injury   . Essential hypertension   . Atrial fibrillation with RVR   . Cardiomyopathy 10/30/2014  . Hypokalemia 10/30/2014  . Acute on chronic combined systolic and diastolic HF (heart failure) 10/30/2014  . NSTEMI (non-ST elevated myocardial infarction)   . SOB (shortness of breath)   . Atrial fibrillation 10/29/2014  . CAP (community acquired pneumonia) 10/29/2014  . HTN (hypertension) 09/16/2011   Discharge Diagnoses:   Patient Active Problem List   Diagnosis Date Noted  . S/P CABG x 2 with clipping of LA appendage 11/14/2014  . Accelerated hypertension   . Cardiac arrest 11/04/2014  . Left main coronary artery disease 11/04/2014  . Cardiomyopathy, ischemic   . Coronary artery disease due to lipid rich plaque   . PEA (Pulseless electrical activity)   . Acute respiratory failure with hypoxemia   . Anoxic brain injury   . Essential hypertension   . Atrial fibrillation with RVR   . Cardiomyopathy 10/30/2014  . Hypokalemia 10/30/2014  . Acute on chronic combined systolic and diastolic HF (heart failure) 10/30/2014  . NSTEMI (non-ST elevated myocardial infarction)   . SOB (shortness of breath)   . Atrial fibrillation 10/29/2014  . CAP (community acquired pneumonia) 10/29/2014  . HTN (hypertension) 09/16/2011   Discharged Condition: good  History of Present Illness:   [name] is a 69 yo African American Female with no known cardiac history.  She has a long  standing history of Hypertension and tobacco abuse.  She was admitted to [facility] 10/29/2014 with a several week history of worsening symptoms of shortness of breath, chest tightness, fatigue, and URI symptoms.  She was initially evaluated by her PCP at which time she was noted to be in Atrial Fibrillation with RVR.  She was sent to the emergency department for evaluation.  EKG confirmed Atrial Fibrillation, Troponin was negative.  CXR was obtained and was concerning for pneumonia and she was started on empiric ABX.  She also underwent TTE which showed a reduced EF of 35-40%.  She had diffuse global hypokinesis especially of the inferior wall.  It was felt the patient should undergo cardiac catheterization for further diagnostics, however patient became anxious and would not consent to the procedure.  The patient calmed down and in a few days and further consultation she was agreeable to proceed with cardiac catheterization.  This was to be done on 11/03/2014 however, the prior to the procedure the patient became agitated, combativeness and shortness of breath.  She progressed to full cardiac arrest with asystole without any preceding signs of symptoms.  She was successful resuscitated but showed signs of decerebrate posturing and was placed on the cooling protocol.  During cooling she continued to have Atrial Fibrillation which progressed to bradycardia.  It was again decided she should undergo cardiac catheterization.  This was completed on 11/04/2014 and revealed significant coronary artery disease.  It was felt coronary bypass grafting would be the treatment of choice and TCTS was consulted.  She was evaluated by Dr. Roxy Manns who agreed at some point she would benefit from Coronary Bypass grafting procedure.  However at the time of evaluation the patient remained on a ventilator and was sedated without signs of neurologic recovery.  It was felt medical therapy should be continued at this time and if patient  awakes and returns to baseline surgery could be reconsidered at that point.    Hospital Course:   The patient was treated medically for several days.  She was successfully weaned and later extubated.  She continued to have issues with Atrial Fibrillation.  She was again evaluated by Dr. Roxy Manns on 11/11/2014 at which time he felt she was medically stable to proceed with Coronary Bypass grafting procedure.  The risks and benefits of the procedure were explained to the patient and she was agreeable to proceed.  She was taken to the operating room on 11/14/2014.  She underwent CABG x 2 utilizing LIMA to LAD, SVG to OM.  She also underwent clipping of her Left Atrial Appendage and endoscopic saphenous vein harvest from her right thigh.  She tolerated the procedure well and was taken to the SICU in stable condition.  The patient was extubated the evening of surgery.  During her stay in the SICU she developed Rapid Atrial Fibrillation.  She was treated with Amiodarone and Lopressor, but ultimately did not convert to NSR.  EP consult was requested and it was felt cardioversion could be attempted.  This was completed on POD # 4 and was unsuccessful.  However, the patient ultimately converted to NSR on her own.  She remains on Lopressor, which has been titrated as tolerated.  She was also placed on Eliquis for stroke prevention.  Her chest tubes and arterial lines have been removed without difficulty.  She was ambulating in the SICU and felt medically stable for transfer on POD # 5.  The patient continues to progress.  She is maintaining NSR and is tolerating Eliquis.  Her pacing wires were removed without difficulty.  She is having issues with hypertension and was placed on Cozaar instead of an ACE due to her elevated creatinine.  She continues to ambulate without difficulty.  She is tolerating a heart healthy diet.  She remains in sinus rhythm with HR in the 60's. As a result, her Amiodarone will be decreased to 200 mg by  mouth two times daily for one week followed by 200 mg daily thereafter. She has continued to be diuresed. Her weight is decreasing as well as the LE and ankle edema. She will be discharged on Lasix 40 mg daily. Both myself and Dr. Cyndia Bent have spoken to her daughter. All questions have been answered. She is felt surgically stable for discharge today.  Consults: cardiology and pulmonary/intensive care  Significant Diagnostic Studies: angiography:  HEMODYNAMICS:  Central Aorta: 130/72  Left Ventricle: 130/14/25  ANGIOGRAPHY:  Left main: Large vessel which trifurcated into a large LAD, a ramus intermediate vessel, and large left circumflex artery artery. There was focal 80% distal left main stenosis.  LAD: Large caliber vessel which gave rise to 2 diagonal branches. Several septal perforating arteries and extended to and wrapped around the LV apex. The LAD was free of significant disease  Ramus Intermediate: Small moderate size vessel that was normal.  Left circumflex: Large vessel which gave rise to one major marginal branch. There was 80% stenosis in the AV groove circumflex proximal to the takeoff of this marginal vessel.   Right coronary artery: Large angiographically  normal vessel which supplied the PDA and small posterolateral vessel  Selective angiography into the left subclavian revealed no evidence for stenosis and a widely patent unbypassed LIMA vessel, which will be used for CABG surgery.  Left ventriculography revealed diffuse hypocontractility with an ejection fraction of 35-40%.  Treatments: surgery:    Coronary Artery Bypass Grafting x 2 Left Internal Mammary Artery to Distal Left Anterior Descending Coronary Artery Saphenous Vein Graft to Obtuse Marginal Branch of Left Circumflex Coronary Artery Endoscopic Vein Harvest from Right Thigh   Clipping of Left Atrial Appendage Atricure Atriclip left atrial  occluder, size 23mm  Disposition: 01-Home or Self Care   Discharge Medications:   Medication List    STOP taking these medications        amoxicillin 500 MG capsule  Commonly known as:  AMOXIL     chlorthalidone 25 MG tablet  Commonly known as:  HYGROTON     hydrochlorothiazide 12.5 MG tablet  Commonly known as:  HYDRODIURIL     ibuprofen 200 MG tablet  Commonly known as:  ADVIL,MOTRIN     meloxicam 7.5 MG tablet  Commonly known as:  MOBIC     metoprolol succinate 25 MG 24 hr tablet  Commonly known as:  TOPROL-XL     potassium chloride 40 MEQ/15ML (20%) Liqd  Replaced by:  potassium chloride 20 MEQ/15ML (10%) Soln      TAKE these medications        amiodarone 200 MG tablet  Commonly known as:  PACERONE  Take 1 tablet (200 mg total) by mouth 2 (two) times daily. For one week then take Amiodarone 200 mg by mouth daily thereafter.     apixaban 2.5 MG Tabs tablet  Commonly known as:  ELIQUIS  Take 1 tablet (2.5 mg total) by mouth 2 (two) times daily.     aspirin 81 MG EC tablet  Take 1 tablet (81 mg total) by mouth daily.     atorvastatin 10 MG tablet  Commonly known as:  LIPITOR  Take 1 tablet (10 mg total) by mouth daily at 6 PM.     furosemide 40 MG tablet  Commonly known as:  LASIX  Take 1 tablet (40 mg total) by mouth daily.     losartan 50 MG tablet  Commonly known as:  COZAAR  Take 1 tablet (50 mg total) by mouth daily.     metoprolol tartrate 25 MG tablet  Commonly known as:  LOPRESSOR  Take 1 tablet (25 mg total) by mouth 2 (two) times daily.     oxyCODONE 5 MG immediate release tablet  Commonly known as:  Oxy IR/ROXICODONE  Take 1-2 tablets (5-10 mg total) by mouth every 4 (four) hours as needed for severe pain.     potassium chloride 20 MEQ/15ML (10%) Soln  Commonly known as:  K-SOL  Take 15 mLs (20 mEq total) by mouth daily.       The patient has been discharged on:   1.Beta Blocker:  Yes [ x  ]                              No   [    ]                              If No, reason:  2.Ace Inhibitor/ARB: Yes [ x  ]  No  [    ]                                     If No, reason:  3.Statin:   Yes [ x  ]                  No  [   ]                  If No, reason:  4.Ecasa:  Yes  [ x  ]                  No   [   ]                  If No, reason:     Follow-up Information    Follow up with [name], MD.   Specialty:  Internal Medicine   Why:  Call for a 2-3 week follow up appintment regarding further surveillance of HGA1C 5.9 and treatment for anxiety and panic attacks   Contact information:   [address] [place] [address] [place] [address] [phone]       Follow up with [name], MD On 12/22/2014.   Specialty:  Cardiothoracic Surgery   Why:  PA/LAT CXR to be taken (at [facility] which is in the same building as Dr. Guy Sandifer office) on 12/22/2014 at 12:00 pm;Appointment with Dr. Roxy Manns is at 1:00 pm   Contact information:   [place] [address] [phone]       Follow up with Tarri Fuller, PA-C On 12/15/2014.   Specialty:  Physician Assistant   Why:  Appointment time is at 3:00 pm   Contact information:   [address] [place] [address] [phone]       Signed: Nani Skillern 11/23/2014, 10:45 AM

## 2014-11-21 NOTE — Consult Note (Signed)
   [facility] Inpatient Consult   11/21/2014  ANVIKA GASHI 12-14-1945 [id number]   Came to visit patient at bedside to discuss and explain [facility] [employer] services. She reports that she is not interested at this time. Therefore, she declines services. Made inpatient RNCM aware.  Marthenia Rolling, MSN-Ed, RN,BSN [facility] Liaison [phone]

## 2014-11-22 LAB — GLUCOSE, CAPILLARY
Glucose-Capillary: 114 mg/dL — ABNORMAL HIGH (ref 70–99)
Glucose-Capillary: 114 mg/dL — ABNORMAL HIGH (ref 70–99)
Glucose-Capillary: 129 mg/dL — ABNORMAL HIGH (ref 70–99)
Glucose-Capillary: 105 mg/dL — ABNORMAL HIGH (ref 70–99)
Glucose-Capillary: 143 mg/dL — ABNORMAL HIGH (ref 70–99)

## 2014-11-22 MED ORDER — FUROSEMIDE 10 MG/ML IJ SOLN
40.0000 mg | Freq: Once | INTRAMUSCULAR | Status: AC
  Administered 2014-11-22: 40 mg via INTRAVENOUS
  Filled 2014-11-22: qty 4

## 2014-11-22 MED ORDER — LOSARTAN POTASSIUM 50 MG PO TABS
50.0000 mg | ORAL_TABLET | Freq: Every day | ORAL | Status: DC
  Administered 2014-11-22 – 2014-11-23 (×2): 50 mg via ORAL
  Filled 2014-11-22 (×2): qty 1

## 2014-11-22 MED ORDER — AMIODARONE HCL 200 MG PO TABS: 200.0000 mg | ORAL_TABLET | Freq: Two times a day (BID) | ORAL | Status: DC

## 2014-11-22 MED ORDER — ATORVASTATIN CALCIUM 10 MG PO TABS: 10.0000 mg | ORAL_TABLET | Freq: Every day | ORAL | Status: DC

## 2014-11-22 MED ORDER — APIXABAN 2.5 MG PO TABS: 2.5000 mg | ORAL_TABLET | Freq: Two times a day (BID) | ORAL | Status: DC

## 2014-11-22 MED ORDER — ALPRAZOLAM 0.25 MG PO TABS: 0.2500 mg | ORAL_TABLET | Freq: Two times a day (BID) | ORAL | Status: DC | PRN

## 2014-11-22 MED ORDER — LOSARTAN POTASSIUM 50 MG PO TABS: 50.0000 mg | ORAL_TABLET | Freq: Every day | ORAL | Status: DC

## 2014-11-22 MED ORDER — OXYCODONE HCL 5 MG PO TABS: 5.0000 mg | ORAL_TABLET | ORAL | Status: DC | PRN

## 2014-11-22 MED ORDER — METOPROLOL TARTRATE 25 MG PO TABS: 25.0000 mg | ORAL_TABLET | Freq: Two times a day (BID) | ORAL | Status: DC

## 2014-11-22 NOTE — Progress Notes (Addendum)
      [address]       ,[place] [address]             [phone]        4 Days Post-Op Procedure(s) (LRB): CARDIOVERSION (N/A)  Subjective: Patient had a bowel movement. She states she wants to go home.  Objective: Vital signs in last 24 hours: Temp:  [98.3 F (36.8 C)-99.2 F (37.3 C)] 98.8 F (37.1 C) (04/09 0343) Pulse Rate:  [55-69] 65 (04/09 0343) Cardiac Rhythm:  [-] Normal sinus rhythm (04/09 0732) Resp:  [20-22] 22 (04/09 0343) BP: (107-177)/(54-83) 164/83 mmHg (04/09 0343) SpO2:  [95 %-96 %] 95 % (04/09 0343) Weight:  [216 lb (97.977 kg)] 216 lb (97.977 kg) (04/09 0343)  Pre op weight 93 kg Current Weight  11/22/14 216 lb (97.977 kg)    Intake/Output from previous day: 04/08 0701 - 04/09 0700 In: 240 [P.O.:240] Out: -    Physical Exam:  Cardiovascular: RRR Pulmonary: Slightly diminished at bases; no rales, wheezes, or rhonchi. Abdomen: Soft, non tender, bowel sounds present. Extremities: Bilateral lower extremity and ankle edema. Wounds: Clean and dry.  No erythema or signs of infection.  Lab Results: CBC:No results for input(s): WBC, HGB, HCT, PLT in the last 72 hours. BMET:  Recent Labs  11/20/14 0425  NA 139  K 4.8  CL 109  CO2 20  GLUCOSE 122*  BUN 30*  CREATININE 1.43*  CALCIUM 8.6    PT/INR:  Lab Results  Component Value Date   INR 1.38 11/14/2014   INR 1.11 11/14/2014   INR 1.15 11/04/2014   ABG:  INR: Will add last result for INR, ABG once components are confirmed Will add last 4 CBG results once components are confirmed  Assessment/Plan:  1. CV - Previous a fib. Maintaining SR in the 60's. On Amiodarone 400 bid, Lopressor 25 bid, Cozaar 25 daily, and Eliquis 2.5 mg bid. Still hypertensive so will increase Cozaar to 50 daily. Monitor HR as may need to decrease Amiodarone.  2.  Pulmonary - On room air. Encourage incentive spirometer 3. Volume Overload - Will give Lasix 40 mg IV this am 4.  Acute blood  loss anemia - Last H and H 8.9 and 26.7 5. Pre op HGA1C 5.9. She is likely pre diabetic. Will need follow up with medical doctor after discharge. 6. Xanax 0.25 mg bid PRN panic attack 7. Possible discharge in am   ZIMMERMAN,DONIELLE MPA-C 11/22/2014,7:38 AM   Chart reviewed, patient examined, agree with above. She still has peripheral edema and wt is 12 lbs over preop. I think she needs further diuresis prior to discharge. Will give her an extra dose this pm.

## 2014-11-23 LAB — BASIC METABOLIC PANEL
Anion gap: 15 (ref 5–15)
BUN: 20 mg/dL (ref 6–23)
CO2: 22 mmol/L (ref 19–32)
Calcium: 8.5 mg/dL (ref 8.4–10.5)
Chloride: 105 mmol/L (ref 96–112)
Creatinine, Ser: 1.35 mg/dL — ABNORMAL HIGH (ref 0.50–1.10)
GFR calc Af Amer: 45 mL/min — ABNORMAL LOW (ref >90)
GFR calc non Af Amer: 39 mL/min — ABNORMAL LOW (ref >90)
Glucose, Bld: 156 mg/dL — ABNORMAL HIGH (ref 70–99)
Potassium: 3.4 mmol/L — ABNORMAL LOW (ref 3.5–5.1)
Sodium: 142 mmol/L (ref 135–145)

## 2014-11-23 LAB — GLUCOSE, CAPILLARY
Glucose-Capillary: 131 mg/dL — ABNORMAL HIGH (ref 70–99)
Glucose-Capillary: 141 mg/dL — ABNORMAL HIGH (ref 70–99)

## 2014-11-23 MED ORDER — FUROSEMIDE 10 MG/ML IJ SOLN
40.0000 mg | Freq: Once | INTRAMUSCULAR | Status: AC
  Administered 2014-11-23: 40 mg via INTRAVENOUS
  Filled 2014-11-23: qty 4

## 2014-11-23 MED ORDER — POTASSIUM CHLORIDE CRYS ER 20 MEQ PO TBCR
40.0000 meq | EXTENDED_RELEASE_TABLET | Freq: Every day | ORAL | Status: DC
  Administered 2014-11-23: 40 meq via ORAL
  Filled 2014-11-23: qty 2

## 2014-11-23 MED ORDER — POTASSIUM CHLORIDE 20 MEQ/15ML (10%) PO SOLN: 20.0000 meq | Freq: Every day | ORAL | Status: DC

## 2014-11-23 MED ORDER — POTASSIUM CHLORIDE CRYS ER 20 MEQ PO TBCR
40.0000 meq | EXTENDED_RELEASE_TABLET | Freq: Once | ORAL | Status: AC
  Administered 2014-11-23: 40 meq via ORAL
  Filled 2014-11-23: qty 2

## 2014-11-23 MED ORDER — FUROSEMIDE 40 MG PO TABS: 40.0000 mg | ORAL_TABLET | Freq: Every day | ORAL | Status: DC

## 2014-11-23 MED ORDER — POTASSIUM CHLORIDE ER 20 MEQ PO TBCR: 20.0000 meq | EXTENDED_RELEASE_TABLET | Freq: Every day | ORAL | Status: DC

## 2014-11-23 MED ORDER — APIXABAN 2.5 MG PO TABS: 2.5000 mg | ORAL_TABLET | Freq: Two times a day (BID) | ORAL | Status: DC

## 2014-11-23 MED ORDER — AMIODARONE HCL 200 MG PO TABS
200.0000 mg | ORAL_TABLET | Freq: Two times a day (BID) | ORAL | Status: DC
  Administered 2014-11-23: 200 mg via ORAL
  Filled 2014-11-23 (×2): qty 1

## 2014-11-23 MED ORDER — AMIODARONE HCL 200 MG PO TABS: 200.0000 mg | ORAL_TABLET | Freq: Two times a day (BID) | ORAL | Status: DC

## 2014-11-23 MED ORDER — ASPIRIN 81 MG PO TBEC: 81.0000 mg | DELAYED_RELEASE_TABLET | Freq: Every day | ORAL | Status: DC

## 2014-11-23 NOTE — Progress Notes (Addendum)
      [address]       [place] [address]             [phone]        5 Days Post-Op Procedure(s) (LRB): CARDIOVERSION (N/A)  Subjective: Patient states she is getting better each day and feels she would do better at home.  Objective: Vital signs in last 24 hours: Temp:  [98.4 F (36.9 C)-98.9 F (37.2 C)] 98.4 F (36.9 C) (04/10 0449) Pulse Rate:  [54-70] 70 (04/10 0449) Cardiac Rhythm:  [-] Normal sinus rhythm (04/10 0803) Resp:  [18-20] 18 (04/10 0449) BP: (137-146)/(58-68) 141/58 mmHg (04/10 0449) SpO2:  [90 %-96 %] 90 % (04/10 0449) Weight:  [208 lb 12.4 oz (94.7 kg)] 208 lb 12.4 oz (94.7 kg) (04/10 0449)  Pre op weight 93 kg Current Weight  11/23/14 208 lb 12.4 oz (94.7 kg)    Intake/Output from previous day: 04/09 0701 - 04/10 0700 In: 480 [P.O.:480] Out: -    Physical Exam:  Cardiovascular: RRR Pulmonary: Slightly diminished at bases; no rales, wheezes, or rhonchi. Abdomen: Soft, non tender, bowel sounds present. Extremities: Bilateral lower extremity and ankle edema, but is decreasing Wounds: Clean and dry.  No erythema or signs of infection.  Lab Results: CBC:No results for input(s): WBC, HGB, HCT, PLT in the last 72 hours. BMET:   Recent Labs  11/23/14 0535  NA 142  K 3.4*  CL 105  CO2 22  GLUCOSE 156*  BUN 20  CREATININE 1.35*  CALCIUM 8.5    PT/INR:  Lab Results  Component Value Date   INR 1.38 11/14/2014   INR 1.11 11/14/2014   INR 1.15 11/04/2014   ABG:  INR: Will add last result for INR, ABG once components are confirmed Will add last 4 CBG results once components are confirmed  Assessment/Plan:  1. CV - Previous a fib. Maintaining SR with HR in the 60's. BP better controlled. On Amiodarone 400 bid, Lopressor 25 bid, Cozaar 50 daily, and Eliquis 2.5 mg bid. Will decrease Amiodarone to 200 mg bid. 2.  Pulmonary - On room air. Encourage incentive spirometer 3. Volume Overload - Will give Lasix 40 mg IV  this am and then continue oral daily as still with LE/ankle edema 4.  Acute blood loss anemia - Last H and H 8.9 and 26.7 6. Xanax 0.25 mg bid PRN panic attack. She will need to follow up with her medical doctor regarding treatment of anxiety and panic attacks. 7. Supplement potassium 8. Creatinine decreased from 1.43 to 1.35 9. Remove PICC line 10. As discussed  with Dr. Cyndia Bent, will discharge home after [facility] arranged   Violet Seabury MPA-C 11/23/2014,8:05 AM   .

## 2014-11-25 DIAGNOSIS — Z48812 Encounter for surgical aftercare following surgery on the circulatory system: Secondary | ICD-10-CM | POA: Diagnosis not present

## 2014-11-27 ENCOUNTER — Telehealth: Payer: Self-pay | Admitting: Physician Assistant

## 2014-11-27 NOTE — Telephone Encounter (Signed)
Beth called in stating that when going to assess her for the day, she complained of intermittent SOB and pain on the rt flank along with being in Afib. She just wanted the doctor to be aware. Please call  Thanks

## 2014-11-27 NOTE — Telephone Encounter (Signed)
Returned call to [facility] with [place] she stated she saw patient for the first time today.Patient c/o sob off and on.Pulse irreg 92 to 110 bpm.B/P 104/68.Advised I will let DOD Dr.Croitoru know and call back.

## 2014-11-27 NOTE — Telephone Encounter (Signed)
Returned call to [facility] with [place] Dr.Croitoru advised no change in medications.Advised to keep post hospital appointment with Tarri Fuller PA 12/15/14 at 3:00 pm.

## 2014-11-28 ENCOUNTER — Ambulatory Visit: Payer: Commercial Managed Care - HMO | Admitting: Cardiology

## 2014-12-09 ENCOUNTER — Telehealth: Payer: Self-pay

## 2014-12-09 DIAGNOSIS — G8918 Other acute postprocedural pain: Secondary | ICD-10-CM

## 2014-12-09 MED ORDER — OXYCODONE HCL 5 MG PO TABS: 5.0000 mg | ORAL_TABLET | ORAL | Status: DC | PRN

## 2014-12-09 NOTE — Telephone Encounter (Signed)
RX refill for Oxycodone 5 mg printed for patient to pick up today after 1600.

## 2014-12-15 ENCOUNTER — Ambulatory Visit: Payer: Commercial Managed Care - HMO | Admitting: Physician Assistant

## 2014-12-18 ENCOUNTER — Telehealth: Payer: Self-pay

## 2014-12-18 ENCOUNTER — Ambulatory Visit: Payer: Commercial Managed Care - HMO | Admitting: Cardiology

## 2014-12-18 NOTE — Telephone Encounter (Signed)
Patient has not met her PT goals. Requesting 2 times per week for 2 more weeks. Ok'ed and gave verbal order for cont'ed PT.

## 2014-12-19 ENCOUNTER — Other Ambulatory Visit: Payer: Self-pay | Admitting: Thoracic Surgery (Cardiothoracic Vascular Surgery)

## 2014-12-19 DIAGNOSIS — Z951 Presence of aortocoronary bypass graft: Secondary | ICD-10-CM

## 2014-12-22 ENCOUNTER — Ambulatory Visit (INDEPENDENT_AMBULATORY_CARE_PROVIDER_SITE_OTHER): Payer: Self-pay | Admitting: Physician Assistant

## 2014-12-22 ENCOUNTER — Ambulatory Visit
Admission: RE | Admit: 2014-12-22 | Discharge: 2014-12-22 | Disposition: A | Discharge status: Home / Self Care | Payer: Commercial Managed Care - HMO | Source: Ambulatory Visit | Attending: Thoracic Surgery (Cardiothoracic Vascular Surgery) | Admitting: Thoracic Surgery (Cardiothoracic Vascular Surgery)

## 2014-12-22 VITALS — BP 150/83 | HR 60 | Resp 20 | Ht 67.5 in | Wt 208.0 lb

## 2014-12-22 DIAGNOSIS — Z951 Presence of aortocoronary bypass graft: Secondary | ICD-10-CM

## 2014-12-22 MED ORDER — METOPROLOL TARTRATE 25 MG PO TABS: 25.0000 mg | ORAL_TABLET | Freq: Two times a day (BID) | ORAL | Status: DC

## 2014-12-22 MED ORDER — LOSARTAN POTASSIUM 100 MG PO TABS: 100.0000 mg | ORAL_TABLET | Freq: Every day | ORAL | Status: DC

## 2014-12-22 MED ORDER — APIXABAN 2.5 MG PO TABS: 2.5000 mg | ORAL_TABLET | Freq: Two times a day (BID) | ORAL | Status: DC

## 2014-12-22 MED ORDER — ATORVASTATIN CALCIUM 10 MG PO TABS: 10.0000 mg | ORAL_TABLET | Freq: Every day | ORAL | Status: DC

## 2014-12-22 MED ORDER — AMIODARONE HCL 200 MG PO TABS: 200.0000 mg | ORAL_TABLET | Freq: Every day | ORAL | Status: DC

## 2014-12-22 MED ORDER — TRAMADOL HCL 50 MG PO TABS: 50.0000 mg | ORAL_TABLET | Freq: Four times a day (QID) | ORAL | Status: DC | PRN

## 2014-12-22 NOTE — Progress Notes (Signed)
  HPI:  Patient returns for routine postoperative follow-up having undergone CABG x2 and Clipping of her LA Appendage on 11/11/2014. [name] early postoperative recovery while in the hospital was notable for Atrial Fibrillation.  Since hospital discharge the patient reports she is doing okay.  She continues to have discomfort across her chest and states for the most part it is numb especially across her left breast.  She has some mild shortness of breath which has improved significantly since hospital discharge.  She has had some episodes of palpitations since hospital discharge all of which have been short lived.  She requests additional pain medication other than Oxy stating Tylenol is not providing enough relief. She is ambulating without difficulty.  She is tolerating a regular diet.  Current Outpatient Prescriptions  Medication Sig Dispense Refill  . amiodarone (PACERONE) 200 MG tablet Take 1 tablet (200 mg total) by mouth daily. 30 tablet 1  . apixaban (ELIQUIS) 2.5 MG TABS tablet Take 1 tablet (2.5 mg total) by mouth 2 (two) times daily. 60 tablet 3  . aspirin EC 81 MG EC tablet Take 1 tablet (81 mg total) by mouth daily.    Marland Kitchen atorvastatin (LIPITOR) 10 MG tablet Take 1 tablet (10 mg total) by mouth daily at 6 PM. 30 tablet 3  . losartan (COZAAR) 100 MG tablet Take 1 tablet (100 mg total) by mouth daily. 30 tablet 3  . metoprolol tartrate (LOPRESSOR) 25 MG tablet Take 1 tablet (25 mg total) by mouth 2 (two) times daily. 60 tablet 3  . traMADol (ULTRAM) 50 MG tablet Take 1 tablet (50 mg total) by mouth every 6 (six) hours as needed. 30 tablet 0   No current facility-administered medications for this visit.    Physical Exam:  BP 150/83 mmHg  Pulse 60  Resp 20  Ht 5' 7.5" (1.715 m)  Wt 208 lb (94.348 kg)  BMI 32.08 kg/m2  SpO2 96%  Gen: no apparent distress Heart: RRR Lungs: CTA bilaterally Abd: soft non-tender, non-distended Skin: incisions well healed  Diagnostic  Tests:  CXR: significant improvement of bilateral pleural effusions, some residual LLL atelectasis  A/P:  1. S/P CABG doing well 2. Atrial Fibrillation- currently maintaining NSR, HR 60, on Amiodarone 200 mg daily and Eliquis BID 3. HTN- on Lopressor, will increase Cozaar to 100 mg daily 4. Pain control- continue Tylenol, provided script for Toradol 5. Activity- patient may resume driving if not using narcotic pain medication.  Patient asks if she may have sex.  She was instructed she can if she can walk up a flight of stairs without difficulty and she does not put weight on her upper extremities until her sternal precautions have been lifted.  She was encouraged to continue to ambulate as tolerated.   We will bring her back to clinic in 6-12 months  Laiza Veenstra, PA-C [facility] [phone]

## 2014-12-29 ENCOUNTER — Telehealth: Payer: Self-pay | Admitting: *Deleted

## 2014-12-29 NOTE — Telephone Encounter (Signed)
Returned cardiac rehab phase II order to [place]. 

## 2015-01-01 NOTE — Patient Outreach (Signed)
[facility]) Care Management  01/01/2015  TAKYLA KUCHERA [birth year] [id number]   Referral from [place], assigned [name], RN.  Ronnell Freshwater. [place], [place] Management [place] Assistant Phone: [phone] Fax: [phone]

## 2015-01-26 ENCOUNTER — Ambulatory Visit (INDEPENDENT_AMBULATORY_CARE_PROVIDER_SITE_OTHER): Payer: Commercial Managed Care - HMO | Admitting: Cardiology

## 2015-01-26 ENCOUNTER — Encounter: Payer: Self-pay | Admitting: Cardiology

## 2015-01-26 VITALS — BP 170/96 | HR 68 | Ht 67.5 in | Wt 205.5 lb

## 2015-01-26 DIAGNOSIS — I10 Essential (primary) hypertension: Secondary | ICD-10-CM

## 2015-01-26 DIAGNOSIS — N183 Chronic kidney disease, stage 3 (moderate): Secondary | ICD-10-CM | POA: Diagnosis not present

## 2015-01-26 DIAGNOSIS — I469 Cardiac arrest, cause unspecified: Secondary | ICD-10-CM

## 2015-01-26 LAB — CBC WITH DIFFERENTIAL/PLATELET
Basophils Absolute: 0.1 10*3/uL (ref 0.0–0.1)
Basophils Relative: 1 % (ref 0–1)
Eosinophils Absolute: 0.1 10*3/uL (ref 0.0–0.7)
Eosinophils Relative: 1 % (ref 0–5)
HCT: 42.9 % (ref 36.0–46.0)
Hemoglobin: 13.9 g/dL (ref 12.0–15.0)
Lymphocytes Relative: 33 % (ref 12–46)
Lymphs Abs: 2.8 10*3/uL (ref 0.7–4.0)
MCH: 26.4 pg (ref 26.0–34.0)
MCHC: 32.4 g/dL (ref 30.0–36.0)
MCV: 81.6 fL (ref 78.0–100.0)
MPV: 11.7 fL (ref 8.6–12.4)
Monocytes Absolute: 0.5 10*3/uL (ref 0.1–1.0)
Monocytes Relative: 6 % (ref 3–12)
Neutro Abs: 5.1 10*3/uL (ref 1.7–7.7)
Neutrophils Relative %: 59 % (ref 43–77)
Platelets: 210 10*3/uL (ref 150–400)
RBC: 5.26 MIL/uL — ABNORMAL HIGH (ref 3.87–5.11)
RDW: 16.6 % — ABNORMAL HIGH (ref 11.5–15.5)
WBC: 8.6 10*3/uL (ref 4.0–10.5)

## 2015-01-26 LAB — BASIC METABOLIC PANEL
BUN: 20 mg/dL (ref 6–23)
CO2: 25 mEq/L (ref 19–32)
Calcium: 9.1 mg/dL (ref 8.4–10.5)
Chloride: 108 mEq/L (ref 96–112)
Creat: 1.4 mg/dL — ABNORMAL HIGH (ref 0.50–1.10)
Glucose, Bld: 92 mg/dL (ref 70–99)
Potassium: 3.6 mEq/L (ref 3.5–5.3)
Sodium: 143 mEq/L (ref 135–145)

## 2015-01-26 MED ORDER — HYDROCHLOROTHIAZIDE 12.5 MG PO CAPS: 12.5000 mg | ORAL_CAPSULE | Freq: Every day | ORAL | Status: DC

## 2015-01-26 NOTE — Progress Notes (Signed)
Cardiology Office Note   Date:  01/26/2015   ID:  [name], DOB Oct 16, 1945, MRN [id number]  PCP:  [name] Low, MD  Cardiologist:  Dr. Debara [name]    Chief Complaint  Patient presents with  . Hospitalization Follow-up    hospital followup; wants to discuss meds; chest pain-chest discomfort from surgery, occassional shortness of breath, no edema, no pain in legs, no cramping in legs, lightheadedness-has at night, no dizziness      History of Present Illness: [name] [name] is a 69 y.o. female who presents for post hospitalization of CABG.  Originally seen by Dr. Raliegh [name]. [name] [name] 12/13/14 for chest pain and a fib. CHADSVASC score is 3. Cardiac cath 11/04/14  After cardiac arrest during hospitalization found to have severe CAD including 80% LT main. and underwent CABG 11/14/2014. She underwent CABG x 2 utilizing LIMA to LAD, SVG to OM. She also underwent clipping of her Left Atrial Appendage and endoscopic saphenous vein harvest from her right thigh. She did have recurrent a fib post op with unsuccessful DCCV.  Though she later converted spontaneously to SR.  She was discharged 11/23/14.  She is anticoagulated with eliquis.    Today has complaints of ache at times rt axilla, worried that it is a bone that was moved with surgery.  She is still in shock that she had surgery.    Her diet has been good, trying to eat healthy.  She is exercising.  Also she has been using tobacco though rarely.  We discussed that she has new blood supply and she needs to take care of her heart.  Her H/H was [name] on d/c expected to be so, and CKD stage 3.  will need recheck.      Past Medical History  Diagnosis Date  . Hypertension   . Asthma   . Arthritis   . S/P CABG x 2 with clipping of LA appendage 11/14/2014    LIMA to LAD, SVG to OM, EVH via right thigh  . Acute on chronic combined systolic and diastolic HF (heart failure) 10/30/2014    EF 35-40% with RWMA   . Atrial fibrillation with RVR   . Cardiomyopathy,  ischemic   . Left main coronary artery disease 11/04/2014    Past Surgical History  Procedure Laterality Date  . Left heart catheterization with coronary angiogram N/A 11/04/2014    Procedure: LEFT HEART CATHETERIZATION WITH CORONARY ANGIOGRAM;  Surgeon: [name] Sine, MD;  Location: [facility] CATH LAB;  Service: Cardiovascular;  Laterality: N/A;  . Temporary pacemaker insertion  11/04/2014    Procedure: TEMPORARY PACEMAKER INSERTION;  Surgeon: [name] Sine, MD;  Location: [facility] CATH LAB;  Service: Cardiovascular;;  . Coronary artery bypass graft N/A 11/14/2014    Procedure: CORONARY ARTERY BYPASS GRAFTING (CABG)TIMES 2 USING LEFT INTERNAL MAMMARY ARTERY AND RIGHT SAPHENOUS VEIN HARVESTED ENDOSCOPICALLY;  Surgeon: [name] Alberts, MD;  Location: [place];  Service: Open Heart Surgery;  Laterality: N/A;  . Clipping of atrial appendage N/A 11/14/2014    Procedure: CLIPPING OF ATRIAL APPENDAGE;  Surgeon: [name] Alberts, MD;  Location: [place];  Service: Open Heart Surgery;  Laterality: N/A;  . Tee without cardioversion N/A 11/14/2014    Procedure: TRANSESOPHAGEAL ECHOCARDIOGRAM (TEE);  Surgeon: [name] Alberts, MD;  Location: [place];  Service: Open Heart Surgery;  Laterality: N/A;  . Cardioversion N/A 11/18/2014    Procedure: CARDIOVERSION;  Surgeon: [name] Casino, MD;  Location: [place];  Service: Cardiovascular;  Laterality: N/A;  Current Outpatient Prescriptions  Medication Sig Dispense Refill  . amiodarone (PACERONE) 200 MG tablet Take 1 tablet (200 mg total) by mouth daily. 30 tablet 1  . apixaban (ELIQUIS) 2.5 MG TABS tablet Take 1 tablet (2.5 mg total) by mouth 2 (two) times daily. 60 tablet 3  . aspirin EC 81 MG EC tablet Take 1 tablet (81 mg total) by mouth daily.    Marland Kitchen atorvastatin (LIPITOR) 10 MG tablet Take 1 tablet (10 mg total) by mouth daily at 6 PM. 30 tablet 3  . losartan (COZAAR) 100 MG tablet Take 1 tablet (100 mg total) by mouth daily. 30 tablet 3  . metoprolol tartrate (LOPRESSOR) 25 MG  tablet Take 1 tablet (25 mg total) by mouth 2 (two) times daily. 60 tablet 3  . traMADol (ULTRAM) 50 MG tablet Take 1 tablet (50 mg total) by mouth every 6 (six) hours as needed. 30 tablet 0   No current facility-administered medications for this visit.    Allergies:   Bee venom and Shrimp    Social History:  The patient  reports that she has been smoking Cigarettes.  She has a 32 pack-year smoking history. She does not have any smokeless tobacco history on file. She reports that she does not drink alcohol or use illicit drugs.   Family History:  [name] family history includes Cancer in her mother; Heart disease in her father.    ROS:  General:no colds or fevers, no weight changes Skin:no rashes or ulcers HEENT:no blurred vision, no congestion CV:see HPI PUL:see HPI GI:no diarrhea constipation or melena, no indigestion GU:no hematuria, no dysuria MS:no joint pain, no claudication Neuro:no syncope, no lightheadedness Endo:no diabetes, no thyroid disease  Wt Readings from Last 3 Encounters:  01/26/15 205 lb 8 oz (93.214 kg)  12/22/14 208 lb (94.348 kg)  11/23/14 208 lb 12.4 oz (94.7 kg)     PHYSICAL EXAM: VS:  BP 170/96 mmHg  Pulse 68  Ht 5' 7.5" (1.715 m)  Wt 205 lb 8 oz (93.214 kg)  BMI 31.69 kg/m2 , BMI Body mass index is 31.69 kg/(m^2). General:Pleasant affect, NAD Skin:Warm and dry, brisk capillary refill HEENT:normocephalic, sclera clear, mucus membranes moist Neck:supple, no JVD, no bruits  Heart:S1S2 RRR without murmur, gallup, rub or click, chest incision healing well. Lungs:clear without rales, rhonchi, or wheezes HGD:JMEQ, non tender, + BS, do not palpate liver spleen or masses Ext:no lower ext edema, 2+ pedal pulses, 2+ radial pulses Neuro:alert and oriented X 3, MAE, follows commands, + facial symmetry    EKG:  EKG is ordered today. The ekg ordered today demonstrates SR rate 62 , lateral Twave changes, no acute changes from the hospital.   Recent  Labs: 10/29/2014: TSH 1.152 10/30/2014: B Natriuretic Peptide 202.1* 11/12/2014: ALT 66* 11/15/2014: Magnesium 1.0* 11/19/2014: Hemoglobin 8.9*; Platelets 147* 11/23/2014: BUN 20; Creatinine, Ser 1.35*; Potassium 3.4*; Sodium 142    Lipid Panel    Component Value Date/Time   CHOL 116 11/12/2014 0432   TRIG 111 11/12/2014 0432   HDL 34* 11/12/2014 0432   CHOLHDL 3.4 11/12/2014 0432   VLDL 22 11/12/2014 0432   LDLCALC 60 11/12/2014 0432       Other studies Reviewed: Additional studies/ records that were reviewed today include: hospital notes, OV notes..   ASSESSMENT AND PLAN:  1.  CAD of native coronary arteries with s/p CABG  LIMA to LAD, SVG to OM. She will begin cardiac rehab.  2. Post arrest prior to cath with bradycardia and  asystole.  Also resp arrest, intubated.  Eventually improved and was able to have cardiac cath leading to the CABG.  3. PAF, pre and post op CABG.  On Eliquis for CHADS VAsc score of 3 .  She continues on amiodarone as well.  She will follow up with Dr. Alma Friendly  In August and he may stop amiodarone at that time  4. Anxiety- reassured about condition.  5. Tobacco use, reminded not to smoke, she has had a few since discharge.    6.  Anemia will recheck CBC as she is on anticoagulation.  Also recheck BMP for CKD -3. She will call if problems.      Current medicines are reviewed with the patient today.  The patient Has no concerns regarding medicines.  The following changes have been made:  See above Labs/ tests ordered today include:see above  Disposition:   FU:  see above  Lennie Muckle, NP  01/26/2015 2:44 PM    [facility] [place], [place], [place], [place] Phone: [phone]; Fax: [phone]

## 2015-01-26 NOTE — Patient Instructions (Addendum)
Start HCTZ 12.5 mg daily  Lab today bmet,cbc  Ok to start cardiac rehab  [place] to take vitamins  No Sudafed  No smoking  Your physician recommends that you schedule a follow-up appointment with Dr.Hilty Thursday 04/02/15 at 2:00 pm.

## 2015-01-28 ENCOUNTER — Other Ambulatory Visit: Payer: Self-pay

## 2015-01-28 NOTE — Patient Outreach (Signed)
[facility]) Care Management  01/28/2015  [name] [name] 03/18/1946 [id number]    Telephone outreach to patient regarding silverback referral.  Unable to reach patient or leave voice message due to phone ringing busy at two attempts.    PLAN:  RNCM will attempt 2nd telephone outreach to patient within 3 business days.   [name] RN,BSN,CCM [place] Coordinator [phone]

## 2015-01-29 ENCOUNTER — Other Ambulatory Visit: Payer: Commercial Managed Care - HMO

## 2015-01-29 NOTE — Patient Outreach (Signed)
[name]) Care Management  01/29/2015  [name] [name] 10-Dec-1945 [id number]   SUBJECTIVE: Second telephone call to patient regarding [facility] referral.  Unable to reach patient. HIPAA compliant voice message left with call back phone number.   PLAN: RNCM will attempt 3rd telephone outreach to patient within 1 week.   [name] RN,BSN,CCM [place] Coordinator [phone]

## 2015-02-03 ENCOUNTER — Other Ambulatory Visit: Payer: Commercial Managed Care - HMO

## 2015-02-03 ENCOUNTER — Telehealth: Payer: Self-pay | Admitting: Internal Medicine

## 2015-02-03 NOTE — Patient Outreach (Signed)
[facility]) Care Management  02/03/2015  [name] [name] Dec 23, 1945 [id number]   Telephone call to patient regarding [facility] referral.  Unable to reach patient. HIPAA compliant voice message left with call back phone number.   PLAN: RNCM will send patient outreach letter to attempt contact.   [name] RN,BSN,CCM [place] Coordinator [phone]

## 2015-02-04 NOTE — Telephone Encounter (Signed)
Close encounter 

## 2015-02-09 ENCOUNTER — Other Ambulatory Visit: Payer: Self-pay

## 2015-02-09 ENCOUNTER — Ambulatory Visit (INDEPENDENT_AMBULATORY_CARE_PROVIDER_SITE_OTHER): Payer: Commercial Managed Care - HMO | Admitting: Internal Medicine

## 2015-02-09 ENCOUNTER — Encounter: Payer: Self-pay | Admitting: Internal Medicine

## 2015-02-09 VITALS — BP 156/78 | HR 84 | Ht 67.5 in | Wt 208.4 lb

## 2015-02-09 DIAGNOSIS — B372 Candidiasis of skin and nail: Secondary | ICD-10-CM

## 2015-02-09 DIAGNOSIS — I4891 Unspecified atrial fibrillation: Secondary | ICD-10-CM

## 2015-02-09 DIAGNOSIS — Z951 Presence of aortocoronary bypass graft: Secondary | ICD-10-CM | POA: Diagnosis not present

## 2015-02-09 DIAGNOSIS — I5043 Acute on chronic combined systolic (congestive) and diastolic (congestive) heart failure: Secondary | ICD-10-CM | POA: Diagnosis not present

## 2015-02-09 DIAGNOSIS — I469 Cardiac arrest, cause unspecified: Secondary | ICD-10-CM | POA: Diagnosis not present

## 2015-02-09 DIAGNOSIS — I2510 Atherosclerotic heart disease of native coronary artery without angina pectoris: Secondary | ICD-10-CM

## 2015-02-09 MED ORDER — AMIODARONE HCL 100 MG PO TABS: 100.0000 mg | ORAL_TABLET | Freq: Every day | ORAL | Status: DC

## 2015-02-09 MED ORDER — NYSTATIN 100000 UNIT/GM EX POWD: CUTANEOUS | Status: DC

## 2015-02-09 NOTE — Patient Instructions (Addendum)
MEDICATION CHANGES >> Dr. Debara Pickett has prescribed nystatin powder to use - apply to affected area twice daily.  >> Dr. Debara Pickett has DECREASED amiodarone to 100mg  daily  Your physician wants you to follow-up in: 3 months with Dr. Debara Pickett. You will receive a reminder letter in the mail two months in advance. If you don't receive a letter, please call our office to schedule the follow-up appointment.

## 2015-02-11 ENCOUNTER — Encounter: Payer: Self-pay | Admitting: Internal Medicine

## 2015-02-11 DIAGNOSIS — B372 Candidiasis of skin and nail: Secondary | ICD-10-CM | POA: Insufficient documentation

## 2015-02-11 NOTE — Progress Notes (Signed)
OFFICE NOTE  Chief Complaint:  Hospital follow-up  Primary Care Physician: [name], MD  HPI:  [name] [name] is a pleasant 69 year old female who I recently saw the hospital when she was in the cardiac care unit. She has a long standing history of Hypertension and tobacco abuse. She was admitted to [facility]2014 with a several week history of worsening symptoms of shortness of breath, chest tightness, fatigue, and URI symptoms. She was initially evaluated by her PCP at which time she was noted to be in Atrial Fibrillation with RVR. She was sent to the emergency department for evaluation. EKG confirmed Atrial Fibrillation, Troponin was negative. CXR was obtained and was concerning for pneumonia and she was started on empiric ABX. She also underwent TTE which showed a reduced EF of 35-40%. She had diffuse global hypokinesis especially of the inferior wall. It was felt the patient should undergo cardiac catheterization for further diagnostics, however patient became anxious and would not consent to the procedure. The patient calmed down and in a few days and further consultation she was agreeable to proceed with cardiac catheterization. This was to be done on 11/03/2014 however, the prior to the procedure the patient became agitated, combativeness and shortness of breath. She progressed to full cardiac arrest with asystole without any preceding signs of symptoms. She was successful resuscitated but showed signs of decerebrate posturing and was placed on the cooling protocol. During cooling she continued to have Atrial Fibrillation which progressed to bradycardia. It was again decided she should undergo cardiac catheterization. This was completed on 11/04/2014 and revealed significant coronary artery disease. It was felt coronary bypass grafting would be the treatment of choice and TCTS was consulted. She was evaluated by Dr. Roxy Manns who agreed at some point she would  benefit from Coronary Bypass grafting procedure. However at the time of evaluation the patient remained on a ventilator and was sedated without signs of neurologic recovery. It was felt medical therapy should be continued at this time and if patient awakes and returns to baseline surgery could be reconsidered at that point.The patient was treated medically for several days. She was successfully weaned and later extubated. She continued to have issues with Atrial Fibrillation. She was again evaluated by Dr. Roxy Manns on 11/11/2014 at which time he felt she was medically stable to proceed with Coronary Bypass grafting procedure. The risks and benefits of the procedure were explained to the patient and she was agreeable to proceed. She was taken to the operating room on 11/14/2014. She underwent CABG x 2 utilizing LIMA to LAD, SVG to OM. She also underwent clipping of her Left Atrial Appendage and endoscopic saphenous vein harvest from her right thigh. She tolerated the procedure well and was taken to the SICU in stable condition. The patient was extubated the evening of surgery. During her stay in the SICU she developed Rapid Atrial Fibrillation. She was treated with Amiodarone and Lopressor, but ultimately did not convert to NSR. EP consult was requested and it was felt cardioversion could be attempted. This was completed on POD # 4 and was unsuccessful. However, the patient ultimately converted to NSR on her own. She remains on Lopressor, which has been titrated as tolerated. She was also placed on Eliquis for stroke prevention.  She returns today for follow-up and is feeling quite well. She denies any chest pain or worsening shortness of breath.  PMHx:  Past Medical History  Diagnosis Date  . Hypertension   . Asthma   .  Arthritis   . S/P CABG x 2 with clipping of LA appendage 11/14/2014    LIMA to LAD, SVG to OM, EVH via right thigh  . Acute on chronic combined systolic and diastolic HF (heart  failure) 10/30/2014    EF 35-40% with RWMA   . Atrial fibrillation with RVR   . Cardiomyopathy, ischemic   . Left main coronary artery disease 11/04/2014    Past Surgical History  Procedure Laterality Date  . Left heart catheterization with coronary angiogram N/A 11/04/2014    Procedure: LEFT HEART CATHETERIZATION WITH CORONARY ANGIOGRAM;  Surgeon: Troy Sine, MD;  Location: [facility] CATH LAB;  Service: Cardiovascular;  Laterality: N/A;  . Temporary pacemaker insertion  11/04/2014    Procedure: TEMPORARY PACEMAKER INSERTION;  Surgeon: Troy Sine, MD;  Location: [facility] CATH LAB;  Service: Cardiovascular;;  . Coronary artery bypass graft N/A 11/14/2014    Procedure: CORONARY ARTERY BYPASS GRAFTING (CABG)TIMES 2 USING LEFT INTERNAL MAMMARY ARTERY AND RIGHT SAPHENOUS VEIN HARVESTED ENDOSCOPICALLY;  Surgeon: [name], MD;  Location: [place];  Service: Open Heart Surgery;  Laterality: N/A;  . Clipping of atrial appendage N/A 11/14/2014    Procedure: CLIPPING OF ATRIAL APPENDAGE;  Surgeon: [name], MD;  Location: [place];  Service: Open Heart Surgery;  Laterality: N/A;  . Tee without cardioversion N/A 11/14/2014    Procedure: TRANSESOPHAGEAL ECHOCARDIOGRAM (TEE);  Surgeon: [name], MD;  Location: [place];  Service: Open Heart Surgery;  Laterality: N/A;  . Cardioversion N/A 11/18/2014    Procedure: CARDIOVERSION;  Surgeon: [name], MD;  Location: [facility] OR;  Service: Cardiovascular;  Laterality: N/A;    FAMHx:  Family History  Problem Relation Age of Onset  . Cancer Mother   . Heart disease Father     SOCHx:   reports that she has been smoking Cigarettes.  She has a 32 pack-year smoking history. She does not have any smokeless tobacco history on file. She reports that she does not drink alcohol or use illicit drugs.  ALLERGIES:  Allergies  Allergen Reactions  . Bee Venom Anaphylaxis  . Shrimp [Shellfish Allergy] Swelling    ROS: A comprehensive review of systems was  negative.  HOME MEDS: Current Outpatient Prescriptions  Medication Sig Dispense Refill  . amiodarone (PACERONE) 100 MG tablet Take 1 tablet (100 mg total) by mouth daily. 30 tablet 6  . apixaban (ELIQUIS) 2.5 MG TABS tablet Take 1 tablet (2.5 mg total) by mouth 2 (two) times daily. 60 tablet 3  . aspirin EC 81 MG EC tablet Take 1 tablet (81 mg total) by mouth daily.    Marland Kitchen atorvastatin (LIPITOR) 10 MG tablet Take 1 tablet (10 mg total) by mouth daily at 6 PM. 30 tablet 3  . diphenhydrAMINE (BENADRYL) 25 MG tablet Take 12.5 mg by mouth as needed.    . hydrochlorothiazide (MICROZIDE) 12.5 MG capsule Take 1 capsule (12.5 mg total) by mouth daily. 30 capsule 6  . losartan (COZAAR) 100 MG tablet Take 1 tablet (100 mg total) by mouth daily. 30 tablet 3  . metoprolol tartrate (LOPRESSOR) 25 MG tablet Take 1 tablet (25 mg total) by mouth 2 (two) times daily. 60 tablet 3  . potassium chloride SA (K-DUR,KLOR-CON) 20 MEQ tablet Take 10 mEq by mouth daily.    . ranitidine (ZANTAC) 150 MG tablet Take 150 mg by mouth as needed for heartburn.    . traMADol (ULTRAM) 50 MG tablet Take 1 tablet (50 mg total) by  mouth every 6 (six) hours as needed. 30 tablet 0  . nystatin (MYCOSTATIN) powder Apply topically to affected area twice daily. 15 g 0   No current facility-administered medications for this visit.    LABS/IMAGING: No results found for this or any previous visit (from the past 48 hour(s)). No results found.  WEIGHTS: Wt Readings from Last 3 Encounters:  02/09/15 208 lb 6.4 oz (94.53 kg)  01/26/15 205 lb 8 oz (93.214 kg)  12/22/14 208 lb (94.348 kg)    VITALS: BP 156/78 mmHg  Pulse 84  Ht 5' 7.5" (1.715 m)  Wt 208 lb 6.4 oz (94.53 kg)  BMI 32.14 kg/m2  EXAM: General appearance: alert and no distress Neck: no carotid bruit, no JVD and thyroid not enlarged, symmetric, no tenderness/mass/nodules Lungs: clear to auscultation bilaterally Heart: regular rate and rhythm, S1, S2 normal, no  murmur, click, rub or gallop Abdomen: soft, non-tender; bowel sounds normal; no masses,  no organomegaly Extremities: extremities normal, atraumatic, no cyanosis or edema Pulses: 2+ and symmetric Skin: Skin color, texture, turgor normal. No rashes or lesions Neurologic: Grossly normal Psych: Pleasant  EKG: Deferred  ASSESSMENT: 1. Coronary artery disease status post CABG 2 with LIMA to LAD and SVG to OM 2. Acute on chronic combined systolic congestive heart failure, EF 35-40% 3. Paroxysmal atrial fibrillation-CHADSVASC 4, on Eliquis - status post left atrial closure 4. Amiodarone therapy 5. Dyslipidemia 6. Candida intertrigo  PLAN: 1.   Mrs. Lie is made an incredible recovery from her recent hospitalization. She seems to be doing well without anginal symptoms. She is interested in decreasing her removing some medications. I do think we can try to wean her off of amiodarone. I recommend decreasing that to 100 mg daily. She did have clipping of the left atrium and is currently on Eliquis. I believe we could consider coming off of this is a few months. She does have follow-up with Dr. Roxy Manns in November and will continue this for the short-term. She is on adequate heart failure medications. Finally, she is reporting a rash underneath her breasts today which appears to be red and punctate, consistent with Candida intertrigo. I'll prescribe her for some nystatin powder. Will plan to recheck her EF in about 3 months by repeat echo.  [name], MD, [facility] Attending Cardiologist [place] C Naijah Lacek 02/11/2015, 7:35 PM

## 2015-02-13 ENCOUNTER — Other Ambulatory Visit: Payer: Self-pay | Admitting: *Deleted

## 2015-02-13 MED ORDER — APIXABAN 2.5 MG PO TABS: 2.5000 mg | ORAL_TABLET | Freq: Two times a day (BID) | ORAL | Status: DC

## 2015-02-13 MED ORDER — ATORVASTATIN CALCIUM 10 MG PO TABS: 10.0000 mg | ORAL_TABLET | Freq: Every day | ORAL | Status: DC

## 2015-02-13 MED ORDER — HYDROCHLOROTHIAZIDE 12.5 MG PO CAPS: 12.5000 mg | ORAL_CAPSULE | Freq: Every day | ORAL | Status: DC

## 2015-02-13 NOTE — Telephone Encounter (Signed)
E refill for faxed request from pharmacy.

## 2015-02-17 NOTE — Patient Outreach (Signed)
[place] [facility]) Care Management  02/17/2015  DONNI OGLESBY February 05, 1946 [id number]   No response from patient after 3 telephone calls and letter outreach.  PLAN:  RNCM will refer patient to [place] Del to close due to inability to establish contact.   RNCM will notify patients primary MD of inability to establish contact with patient.   [name] RN,BSN,CCM [place] Coordinator [phone]

## 2015-02-18 NOTE — Patient Outreach (Signed)
[place] [facility]) Care Management  02/18/2015  DEVANSHI CALIFF [birth year] [id number]   Notification from [name], RN to close case due to unable to contact patient for [place] Management services.  Ronnell Freshwater. [place], [place] Management [place] Assistant Phone: [phone] Fax: [phone]

## 2015-02-25 ENCOUNTER — Encounter (HOSPITAL_COMMUNITY): Payer: Self-pay

## 2015-02-25 ENCOUNTER — Telehealth: Payer: Self-pay | Admitting: Internal Medicine

## 2015-02-25 ENCOUNTER — Emergency Department (HOSPITAL_COMMUNITY)
Admission: EM | Admit: 2015-02-25 | Discharge: 2015-02-25 | Disposition: A | Discharge status: Home / Self Care | Payer: Commercial Managed Care - HMO | Attending: Emergency Medicine | Admitting: Emergency Medicine

## 2015-02-25 ENCOUNTER — Emergency Department (HOSPITAL_COMMUNITY): Payer: Commercial Managed Care - HMO

## 2015-02-25 DIAGNOSIS — I4891 Unspecified atrial fibrillation: Secondary | ICD-10-CM | POA: Insufficient documentation

## 2015-02-25 DIAGNOSIS — Z951 Presence of aortocoronary bypass graft: Secondary | ICD-10-CM | POA: Insufficient documentation

## 2015-02-25 DIAGNOSIS — I2510 Atherosclerotic heart disease of native coronary artery without angina pectoris: Secondary | ICD-10-CM | POA: Insufficient documentation

## 2015-02-25 DIAGNOSIS — M109 Gout, unspecified: Secondary | ICD-10-CM | POA: Diagnosis not present

## 2015-02-25 DIAGNOSIS — I10 Essential (primary) hypertension: Secondary | ICD-10-CM | POA: Insufficient documentation

## 2015-02-25 DIAGNOSIS — M1990 Unspecified osteoarthritis, unspecified site: Secondary | ICD-10-CM | POA: Insufficient documentation

## 2015-02-25 DIAGNOSIS — J45909 Unspecified asthma, uncomplicated: Secondary | ICD-10-CM | POA: Insufficient documentation

## 2015-02-25 DIAGNOSIS — Z7982 Long term (current) use of aspirin: Secondary | ICD-10-CM | POA: Insufficient documentation

## 2015-02-25 DIAGNOSIS — Z9889 Other specified postprocedural states: Secondary | ICD-10-CM | POA: Diagnosis not present

## 2015-02-25 DIAGNOSIS — Z79899 Other long term (current) drug therapy: Secondary | ICD-10-CM | POA: Diagnosis not present

## 2015-02-25 DIAGNOSIS — Z720 Tobacco use: Secondary | ICD-10-CM | POA: Insufficient documentation

## 2015-02-25 MED ORDER — ONDANSETRON 4 MG PO TBDP: 4.0000 mg | ORAL_TABLET | Freq: Three times a day (TID) | ORAL | Status: DC | PRN

## 2015-02-25 MED ORDER — HYDROCODONE-ACETAMINOPHEN 5-325 MG PO TABS: 1.0000 | ORAL_TABLET | ORAL | Status: DC | PRN

## 2015-02-25 MED ORDER — HYDROCODONE-ACETAMINOPHEN 5-325 MG PO TABS
2.0000 | ORAL_TABLET | Freq: Once | ORAL | Status: DC
  Filled 2015-02-25: qty 2

## 2015-02-25 MED ORDER — HYDROCODONE-ACETAMINOPHEN 5-325 MG PO TABS
1.0000 | ORAL_TABLET | Freq: Once | ORAL | Status: AC
  Administered 2015-02-25: 1 via ORAL

## 2015-02-25 MED ORDER — ONDANSETRON 4 MG PO TBDP
4.0000 mg | ORAL_TABLET | Freq: Once | ORAL | Status: AC
  Administered 2015-02-25: 4 mg via ORAL
  Filled 2015-02-25: qty 1

## 2015-02-25 NOTE — Telephone Encounter (Signed)
Pt is calling in stating that her right foot is swelling again and she would like to know what to do. Please call  Thanks

## 2015-02-25 NOTE — Telephone Encounter (Signed)
Returned call to patient she stated she is not having sob or weight gain.Dr.Hilty advised needs to see PCP for possible gout.Also consider switching HCTZ to Lasix.

## 2015-02-25 NOTE — Telephone Encounter (Signed)
Is their weight gain or more shortness of breath suggestive of heart failure? Otherwise, suspect that could be gout .Marland Kitchen Swelling of the legs with CHF should not be painful. She needs evaluated, may want to start with PCP. Could consider switching HCTZ to lasix.  DR. Lemmie Evens

## 2015-02-25 NOTE — ED Notes (Signed)
Pt states gout pain in rt foot x 2 days.  Unable to take prednisone d/t medical history.  No injury noted.

## 2015-02-25 NOTE — Telephone Encounter (Signed)
Returned call to patient she stated for the past 2 days right foot is swollen and painful.Stated she is taking HCTZ 12.5 mg daily,don't seem to be helping.Message sent to Dr.Hilty for advice.

## 2015-02-25 NOTE — Discharge Instructions (Signed)
Take vicodin as needed for pain. Take zofran as needed for nausea. Follow up with your doctor for further evaluation. Return to the ED with worsening or concerning symptoms.

## 2015-02-25 NOTE — ED Notes (Signed)
Pt called x 3.  Not found in lobby.

## 2015-02-25 NOTE — ED Provider Notes (Signed)
CSN: [id number]     Arrival date & time 02/25/15  1800 History   This chart was scribed for Alvina Chou, PA-C working with Jola Schmidt, MD by Mercy Moore, ED Scribe. This patient was seen in room WTR5/WTR5 and the patient's care was started at 8:16 PM.   Chief Complaint  Patient presents with  . Gout   The history is provided by the patient. No language interpreter was used.   HPI Comments: [name] [name] is a 69 y.o. female who presents to the Emergency Department complaining of right foot pain consistent with her historical gout pain, ongoing for two days now. Patient reports aching, throbbing pain at the dorsum of her foot and posterior ankle that is exacerbated with applied pressure. Patient shares that she is unable to take her Prednisone, but she is 3 months status post open heart surgery. Patient does not take any additional medications for her gout. Patient reports treatment with Tylenol Extra Strength, permitted by her cardiologist, but she denies relief.    Past Medical History  Diagnosis Date  . Hypertension   . Asthma   . Arthritis   . S/P CABG x 2 with clipping of LA appendage 11/14/2014    LIMA to LAD, SVG to OM, EVH via right thigh  . Acute on chronic combined systolic and diastolic HF (heart failure) 10/30/2014    EF 35-40% with RWMA   . Atrial fibrillation with RVR   . Cardiomyopathy, ischemic   . Left main coronary artery disease 11/04/2014   Past Surgical History  Procedure Laterality Date  . Left heart catheterization with coronary angiogram N/A 11/04/2014    Procedure: LEFT HEART CATHETERIZATION WITH CORONARY ANGIOGRAM;  Surgeon: Troy Sine, MD;  Location: [facility] CATH LAB;  Service: Cardiovascular;  Laterality: N/A;  . Temporary pacemaker insertion  11/04/2014    Procedure: TEMPORARY PACEMAKER INSERTION;  Surgeon: Troy Sine, MD;  Location: [facility] CATH LAB;  Service: Cardiovascular;;  . Coronary artery bypass graft N/A 11/14/2014    Procedure: CORONARY ARTERY  BYPASS GRAFTING (CABG)TIMES 2 USING LEFT INTERNAL MAMMARY ARTERY AND RIGHT SAPHENOUS VEIN HARVESTED ENDOSCOPICALLY;  Surgeon: [name], MD;  Location: [place];  Service: Open Heart Surgery;  Laterality: N/A;  . Clipping of atrial appendage N/A 11/14/2014    Procedure: CLIPPING OF ATRIAL APPENDAGE;  Surgeon: [name], MD;  Location: [place];  Service: Open Heart Surgery;  Laterality: N/A;  . Tee without cardioversion N/A 11/14/2014    Procedure: TRANSESOPHAGEAL ECHOCARDIOGRAM (TEE);  Surgeon: [name], MD;  Location: [place];  Service: Open Heart Surgery;  Laterality: N/A;  . Cardioversion N/A 11/18/2014    Procedure: CARDIOVERSION;  Surgeon: [name], MD;  Location: [facility] OR;  Service: Cardiovascular;  Laterality: N/A;   Family History  Problem Relation Age of Onset  . Cancer Mother   . Heart disease Father    History  Substance Use Topics  . Smoking status: Current Some Day Smoker -- 1.00 packs/day for 32 years    Types: Cigarettes  . Smokeless tobacco: Not on file     Comment: occasional puff here and there - 02/09/15  . Alcohol Use: No   OB History    No data available     Review of Systems  Constitutional: Negative for fever and chills.  Musculoskeletal: Positive for joint swelling and arthralgias.  Skin: Negative for wound.  Neurological: Negative for weakness and numbness.   A complete 10 system review of systems was  obtained and all systems are negative except as noted in the HPI and PMH.    Allergies  Bee venom; Codeine; and Shrimp  Home Medications   Prior to Admission medications   Medication Sig Start Date End Date Taking? Authorizing Provider  acetaminophen (TYLENOL) 500 MG tablet Take 1,500 mg by mouth every 6 (six) hours as needed for moderate pain.   Yes Historical Provider, MD  amiodarone (PACERONE) 100 MG tablet Take 1 tablet (100 mg total) by mouth daily. 02/09/15  Yes [name], MD  apixaban (ELIQUIS) 2.5 MG TABS tablet Take 1 tablet (2.5  mg total) by mouth 2 (two) times daily. 02/13/15  Yes [name], MD  aspirin EC 81 MG EC tablet Take 1 tablet (81 mg total) by mouth daily. 11/23/14  Yes Donielle Liston Alba, PA-C  atorvastatin (LIPITOR) 10 MG tablet Take 1 tablet (10 mg total) by mouth daily at 6 PM. 02/13/15  Yes [name], MD  diphenhydrAMINE (BENADRYL) 25 MG tablet Take 12.5 mg by mouth as needed (tongue swell).    Yes Historical Provider, MD  furosemide (LASIX) 40 MG tablet Take 1 tablet by mouth daily. 01/17/15  Yes Historical Provider, MD  hydrochlorothiazide (MICROZIDE) 12.5 MG capsule Take 1 capsule (12.5 mg total) by mouth daily. 02/13/15  Yes [name], MD  losartan (COZAAR) 100 MG tablet Take 1 tablet (100 mg total) by mouth daily. 12/22/14  Yes Erin R Barrett, PA-C  metoprolol tartrate (LOPRESSOR) 25 MG tablet Take 1 tablet (25 mg total) by mouth 2 (two) times daily. 12/22/14  Yes Erin R Barrett, PA-C  potassium chloride SA (K-DUR,KLOR-CON) 20 MEQ tablet Take 10 mEq by mouth daily.   Yes Historical Provider, MD  ranitidine (ZANTAC) 150 MG tablet Take 75 mg by mouth as needed for heartburn.    Yes Historical Provider, MD  traMADol (ULTRAM) 50 MG tablet Take 1 tablet (50 mg total) by mouth every 6 (six) hours as needed. 12/22/14  Yes Erin R Barrett, PA-C  nystatin (MYCOSTATIN) powder Apply topically to affected area twice daily. Patient not taking: Reported on 02/25/2015 02/09/15   [name], MD   Triage Vitals: BP 161/84 mmHg  Pulse 70  Temp(Src) 98.1 F (36.7 C) (Oral)  Resp 16  SpO2 97% Physical Exam  Constitutional: She is oriented to person, place, and time. She appears well-developed and well-nourished. No distress.  HENT:  Head: Normocephalic and atraumatic.  Eyes: Conjunctivae and EOM are normal.  Neck: Neck supple. No tracheal deviation present.  Cardiovascular: Normal rate and regular rhythm.  Exam reveals no gallop and no friction rub.   No murmur heard. Pulmonary/Chest: Effort normal and  breath sounds normal. No respiratory distress. She has no wheezes. She has no rales. She exhibits no tenderness.  Abdominal: Soft. There is no tenderness.  Musculoskeletal:  Generalized right foot swelling with limited ROM of right ankle due to pain and swelling. There is associated erythema and warmth to palpation. Patient is able to wiggle toes. No open wound.   Neurological: She is alert and oriented to person, place, and time.  Speech is goal-oriented. Moves limbs without ataxia.   Skin: Skin is warm and dry. There is erythema.  Psychiatric: She has a normal mood and affect. Her behavior is normal.  Nursing note and vitals reviewed.   ED Course  Procedures (including critical care time)  COORDINATION OF CARE: 8:21 PM- Discussed treatment plan with patient at bedside and patient agreed to plan.  Labs Review Labs Reviewed - No data to display  Imaging Review Dg Foot Complete Right  02/25/2015   CLINICAL DATA:  27-year-old female with right foot pain  EXAM: RIGHT FOOT COMPLETE - 3+ VIEW  COMPARISON:  None.  FINDINGS: There is no acute fracture or dislocation. There are cortical thickening and irregularity of the second-fourth metatarsal which may be related to old trauma or represent melorheostosis. There is diffuse soft tissue swelling of the foot. No radiopaque foreign object identified.  IMPRESSION: Soft tissue swelling of the foot.  No acute sided fracture.   Electronically Signed   By: Anner Crete M.D.   On: 02/25/2015 19:19     EKG Interpretation None      MDM   Final diagnoses:  Acute gout of right ankle, unspecified cause    8:34 PM Patient' symptoms consistent with previous gout flares. Patient is not able to take prednisone, which she usually takes for gout, due to recent CABG. Patient will have vicodin for pain and zofran for nausea. Xray shows soft tissue swelling of the right foot without acute fracture.   I personally performed the services described in this  documentation, which was scribed in my presence. The recorded information has been reviewed and is accurate.    Alvina Chou, PA-C 02/25/15 2038  Jola Schmidt, MD 02/26/15 [phone]

## 2015-02-25 NOTE — ED Notes (Signed)
Pt is refusing to wait after medication administration for monitoring.

## 2015-02-28 ENCOUNTER — Encounter (HOSPITAL_COMMUNITY): Payer: Self-pay | Admitting: Emergency Medicine

## 2015-02-28 ENCOUNTER — Emergency Department (HOSPITAL_COMMUNITY)
Admission: EM | Admit: 2015-02-28 | Discharge: 2015-02-28 | Disposition: A | Discharge status: Home / Self Care | Payer: Commercial Managed Care - HMO | Attending: Emergency Medicine | Admitting: Emergency Medicine

## 2015-02-28 DIAGNOSIS — I4891 Unspecified atrial fibrillation: Secondary | ICD-10-CM | POA: Insufficient documentation

## 2015-02-28 DIAGNOSIS — Z9889 Other specified postprocedural states: Secondary | ICD-10-CM | POA: Diagnosis not present

## 2015-02-28 DIAGNOSIS — Z79899 Other long term (current) drug therapy: Secondary | ICD-10-CM | POA: Insufficient documentation

## 2015-02-28 DIAGNOSIS — Z7982 Long term (current) use of aspirin: Secondary | ICD-10-CM | POA: Diagnosis not present

## 2015-02-28 DIAGNOSIS — I5043 Acute on chronic combined systolic (congestive) and diastolic (congestive) heart failure: Secondary | ICD-10-CM | POA: Insufficient documentation

## 2015-02-28 DIAGNOSIS — I10 Essential (primary) hypertension: Secondary | ICD-10-CM | POA: Insufficient documentation

## 2015-02-28 DIAGNOSIS — M1990 Unspecified osteoarthritis, unspecified site: Secondary | ICD-10-CM | POA: Diagnosis not present

## 2015-02-28 DIAGNOSIS — I2510 Atherosclerotic heart disease of native coronary artery without angina pectoris: Secondary | ICD-10-CM | POA: Diagnosis not present

## 2015-02-28 DIAGNOSIS — Z951 Presence of aortocoronary bypass graft: Secondary | ICD-10-CM | POA: Diagnosis not present

## 2015-02-28 DIAGNOSIS — Z720 Tobacco use: Secondary | ICD-10-CM | POA: Diagnosis not present

## 2015-02-28 DIAGNOSIS — M10071 Idiopathic gout, right ankle and foot: Secondary | ICD-10-CM | POA: Diagnosis not present

## 2015-02-28 DIAGNOSIS — J45909 Unspecified asthma, uncomplicated: Secondary | ICD-10-CM | POA: Insufficient documentation

## 2015-02-28 DIAGNOSIS — M79671 Pain in right foot: Secondary | ICD-10-CM | POA: Diagnosis present

## 2015-02-28 DIAGNOSIS — Z950 Presence of cardiac pacemaker: Secondary | ICD-10-CM | POA: Diagnosis not present

## 2015-02-28 DIAGNOSIS — M109 Gout, unspecified: Secondary | ICD-10-CM

## 2015-02-28 MED ORDER — OXYCODONE-ACETAMINOPHEN 5-325 MG PO TABS
1.0000 | ORAL_TABLET | Freq: Once | ORAL | Status: AC
  Administered 2015-02-28: 1 via ORAL
  Filled 2015-02-28: qty 1

## 2015-02-28 MED ORDER — OXYCODONE-ACETAMINOPHEN 5-325 MG PO TABS: 1.0000 | ORAL_TABLET | ORAL | Status: DC | PRN

## 2015-02-28 NOTE — ED Provider Notes (Signed)
CSN: [id number]     Arrival date & time 02/28/15  1409 History   First MD Initiated Contact with Patient 02/28/15 1517     Chief Complaint  Patient presents with  . Foot Pain     (Consider location/radiation/quality/duration/timing/severity/associated sxs/prior Treatment) Patient is a 69 y.o. female presenting with lower extremity pain. The history is provided by the patient and medical records.  Foot Pain Associated symptoms include arthralgias.    This is a 69 year old female with history of hypertension, asthma, arthritis, A. fib, recent CABG, presenting to the ED for right foot pain and swelling. She was seen here for the same 3 days ago with negative x-ray and suspected gout flare. Patient does have history of gout, admits she has been eating hot dogs headaches for breakfast on a daily basis for the past week or so. She denies any injury, trauma, or falls.  No numbness, weakness.  Patient states pain is worse with weight bearing and ambulation.  Patient states she usually takes prednisone for her gout, however cannot take currently due to recent CABG 3 months ago.  She has been taking vicodin as prescribed from prior visit without relief.  She also tried wrapping her foot at night, however reports this was too painful.  No fever, chills, sweats.  VSS.  Past Medical History  Diagnosis Date  . Hypertension   . Asthma   . Arthritis   . S/P CABG x 2 with clipping of LA appendage 11/14/2014    LIMA to LAD, SVG to OM, EVH via right thigh  . Acute on chronic combined systolic and diastolic HF (heart failure) 10/30/2014    EF 35-40% with RWMA   . Atrial fibrillation with RVR   . Cardiomyopathy, ischemic   . Left main coronary artery disease 11/04/2014   Past Surgical History  Procedure Laterality Date  . Left heart catheterization with coronary angiogram N/A 11/04/2014    Procedure: LEFT HEART CATHETERIZATION WITH CORONARY ANGIOGRAM;  Surgeon: Troy Sine, MD;  Location: [facility] CATH LAB;   Service: Cardiovascular;  Laterality: N/A;  . Temporary pacemaker insertion  11/04/2014    Procedure: TEMPORARY PACEMAKER INSERTION;  Surgeon: Troy Sine, MD;  Location: [facility] CATH LAB;  Service: Cardiovascular;;  . Coronary artery bypass graft N/A 11/14/2014    Procedure: CORONARY ARTERY BYPASS GRAFTING (CABG)TIMES 2 USING LEFT INTERNAL MAMMARY ARTERY AND RIGHT SAPHENOUS VEIN HARVESTED ENDOSCOPICALLY;  Surgeon: [name], MD;  Location: [place];  Service: Open Heart Surgery;  Laterality: N/A;  . Clipping of atrial appendage N/A 11/14/2014    Procedure: CLIPPING OF ATRIAL APPENDAGE;  Surgeon: [name], MD;  Location: [place];  Service: Open Heart Surgery;  Laterality: N/A;  . Tee without cardioversion N/A 11/14/2014    Procedure: TRANSESOPHAGEAL ECHOCARDIOGRAM (TEE);  Surgeon: [name], MD;  Location: [place];  Service: Open Heart Surgery;  Laterality: N/A;  . Cardioversion N/A 11/18/2014    Procedure: CARDIOVERSION;  Surgeon: [name], MD;  Location: [facility] OR;  Service: Cardiovascular;  Laterality: N/A;   Family History  Problem Relation Age of Onset  . Cancer Mother   . Heart disease Father    History  Substance Use Topics  . Smoking status: Current Some Day Smoker -- 1.00 packs/day for 32 years    Types: Cigarettes  . Smokeless tobacco: Not on file     Comment: occasional puff here and there - 02/09/15  . Alcohol Use: No   OB History  No data available     Review of Systems  Musculoskeletal: Positive for arthralgias.  All other systems reviewed and are negative.     Allergies  Bee venom; Codeine; and Shrimp  Home Medications   Prior to Admission medications   Medication Sig Start Date End Date Taking? Authorizing Provider  acetaminophen (TYLENOL) 500 MG tablet Take 1,500 mg by mouth every 6 (six) hours as needed for moderate pain.   Yes Historical Provider, MD  amiodarone (PACERONE) 100 MG tablet Take 1 tablet (100 mg total) by mouth daily. 02/09/15  Yes  [name], MD  apixaban (ELIQUIS) 2.5 MG TABS tablet Take 1 tablet (2.5 mg total) by mouth 2 (two) times daily. 02/13/15  Yes [name], MD  aspirin EC 81 MG EC tablet Take 1 tablet (81 mg total) by mouth daily. 11/23/14  Yes Donielle Liston Alba, PA-C  atorvastatin (LIPITOR) 10 MG tablet Take 1 tablet (10 mg total) by mouth daily at 6 PM. 02/13/15  Yes [name], MD  diphenhydrAMINE (BENADRYL) 25 MG tablet Take 12.5 mg by mouth as needed (tongue swell).    Yes Historical Provider, MD  furosemide (LASIX) 40 MG tablet Take 1 tablet by mouth daily as needed for fluid.  01/17/15  Yes Historical Provider, MD  hydrochlorothiazide (MICROZIDE) 12.5 MG capsule Take 1 capsule (12.5 mg total) by mouth daily. 02/13/15  Yes [name], MD  HYDROcodone-acetaminophen (NORCO/VICODIN) 5-325 MG per tablet Take 1-2 tablets by mouth every 4 (four) hours as needed. 02/25/15  Yes Kaitlyn Szekalski, PA-C  losartan (COZAAR) 100 MG tablet Take 1 tablet (100 mg total) by mouth daily. 12/22/14  Yes Erin R Barrett, PA-C  metoprolol tartrate (LOPRESSOR) 25 MG tablet Take 1 tablet (25 mg total) by mouth 2 (two) times daily. 12/22/14  Yes Erin R Barrett, PA-C  ondansetron (ZOFRAN ODT) 4 MG disintegrating tablet Take 1 tablet (4 mg total) by mouth every 8 (eight) hours as needed for nausea or vomiting. 02/25/15  Yes Kaitlyn Szekalski, PA-C  potassium chloride SA (K-DUR,KLOR-CON) 20 MEQ tablet Take 10 mEq by mouth daily.   Yes Historical Provider, MD  ranitidine (ZANTAC) 150 MG tablet Take 75 mg by mouth as needed for heartburn.    Yes Historical Provider, MD  traMADol (ULTRAM) 50 MG tablet Take 1 tablet (50 mg total) by mouth every 6 (six) hours as needed. 12/22/14  Yes Erin R Barrett, PA-C  nystatin (MYCOSTATIN) powder Apply topically to affected area twice daily. Patient not taking: Reported on 02/25/2015 02/09/15   [name], MD   BP 155/81 mmHg  Pulse 74  Temp(Src) 98.7 F (37.1 C) (Oral)  Resp 18  SpO2 96%    Physical Exam  Constitutional: She is oriented to person, place, and time. She appears well-developed and well-nourished. No distress.  HENT:  Head: Normocephalic and atraumatic.  Mouth/Throat: Oropharynx is clear and moist.  Eyes: Conjunctivae and EOM are normal. Pupils are equal, round, and reactive to light.  Neck: Normal range of motion. Neck supple.  Cardiovascular: Normal rate, regular rhythm and normal heart sounds.   Pulmonary/Chest: Effort normal and breath sounds normal. No respiratory distress. She has no rales.  Abdominal: Soft. Bowel sounds are normal. There is no tenderness. There is no guarding.  Musculoskeletal: Normal range of motion.  Right ankle and foot with generalized swelling; no acute bony deformities; generalized tenderness; slight erythema and warmth to touch; no streaking of leg; no calf asymmetry, tenderness, or palpable cords; full  ROM of ankle and foot maintained however it is painful; strong DP pulse and cap refill; normal sensation throughout leg and foot  Neurological: She is alert and oriented to person, place, and time.  Skin: Skin is warm and dry. She is not diaphoretic.  Psychiatric: She has a normal mood and affect.  Nursing note and vitals reviewed.   ED Course  Procedures (including critical care time) Labs Review Labs Reviewed - No data to display  Imaging Review No results found.   EKG Interpretation None      MDM   Final diagnoses:  Acute gout of right foot, unspecified cause   68 y.o. F here with right foot pain.  Seen here 3 days ago for the same.  Right ankle and foot have generalized swelilng without acute deformity or signs of trauma.  No calf swelling or asymmetry to suggest DVT.  There is slight erythema and warmth of dorsum of right foot without streaking into leg.  I do not suspect septic joint or cellulitis.  I agree with prior assessment that this is likely an exacerbation of her gout.  Will change pain medication to  percocet.  Patient will FU with her podiatrist next week.  Discussed plan with patient, he/she acknowledged understanding and agreed with plan of care.  Return precautions given for new or worsening symptoms.  Larene Pickett, PA-C 02/28/15 1603  Evelina Bucy, MD 02/28/15 2150

## 2015-02-28 NOTE — Discharge Instructions (Signed)
Take the prescribed medication as directed.  Do not take this and vicodin-- take one or the other. Follow-up with your podiatrist next week-- call to make appt. Return to the ED for new or worsening symptoms.

## 2015-02-28 NOTE — ED Notes (Addendum)
Pt reported having rt foot pain and swelling. Denies trauma/injury. No deformity, (+) movement and sensation, CRT brisk. Pt reported having a knot on anterior of foot stating that if something touches the knot her foot hurts. Pt reported been seen here on Tuesday for same problem.

## 2015-03-12 ENCOUNTER — Telehealth: Payer: Self-pay | Admitting: Internal Medicine

## 2015-03-12 NOTE — Telephone Encounter (Signed)
Spoke to Helen at Dr. Lindley Magnus office [phone] [phone] fax. They are needing clearance for patient to have steroidal injection for gout - Dexamethasone and Marcaine.  Routed to Dr. Debara Pickett.

## 2015-03-12 NOTE — Telephone Encounter (Signed)
Faxed to Dr. Lindley Magnus office - Attn: Lanelle Bal.

## 2015-03-12 NOTE — Telephone Encounter (Signed)
Pt says she needs to take injections for the gout.She did not know the name of the injection. They want to know if she can take the injection along with her other medicine.If you want the name of the injection,please call Dr Lindley Magnus office-[phone].

## 2015-03-12 NOTE — Telephone Encounter (Signed)
She could have an injection in her foot for gout - probably don't have to hold her blood thinner for this.  Dr. Lemmie Evens

## 2015-03-15 ENCOUNTER — Emergency Department (HOSPITAL_COMMUNITY)
Admission: EM | Admit: 2015-03-15 | Discharge: 2015-03-15 | Disposition: A | Discharge status: Home / Self Care | Payer: Commercial Managed Care - HMO | Attending: Emergency Medicine | Admitting: Emergency Medicine

## 2015-03-15 ENCOUNTER — Encounter (HOSPITAL_COMMUNITY): Payer: Self-pay | Admitting: Emergency Medicine

## 2015-03-15 DIAGNOSIS — I4891 Unspecified atrial fibrillation: Secondary | ICD-10-CM | POA: Diagnosis not present

## 2015-03-15 DIAGNOSIS — J45909 Unspecified asthma, uncomplicated: Secondary | ICD-10-CM | POA: Insufficient documentation

## 2015-03-15 DIAGNOSIS — M79671 Pain in right foot: Secondary | ICD-10-CM

## 2015-03-15 DIAGNOSIS — Z7982 Long term (current) use of aspirin: Secondary | ICD-10-CM | POA: Insufficient documentation

## 2015-03-15 DIAGNOSIS — Z79899 Other long term (current) drug therapy: Secondary | ICD-10-CM | POA: Insufficient documentation

## 2015-03-15 DIAGNOSIS — I10 Essential (primary) hypertension: Secondary | ICD-10-CM | POA: Insufficient documentation

## 2015-03-15 DIAGNOSIS — I2510 Atherosclerotic heart disease of native coronary artery without angina pectoris: Secondary | ICD-10-CM | POA: Diagnosis not present

## 2015-03-15 DIAGNOSIS — Z951 Presence of aortocoronary bypass graft: Secondary | ICD-10-CM | POA: Diagnosis not present

## 2015-03-15 DIAGNOSIS — Z720 Tobacco use: Secondary | ICD-10-CM | POA: Diagnosis not present

## 2015-03-15 DIAGNOSIS — Z950 Presence of cardiac pacemaker: Secondary | ICD-10-CM | POA: Insufficient documentation

## 2015-03-15 DIAGNOSIS — M1990 Unspecified osteoarthritis, unspecified site: Secondary | ICD-10-CM | POA: Diagnosis not present

## 2015-03-15 DIAGNOSIS — Z7902 Long term (current) use of antithrombotics/antiplatelets: Secondary | ICD-10-CM | POA: Diagnosis not present

## 2015-03-15 DIAGNOSIS — I5043 Acute on chronic combined systolic (congestive) and diastolic (congestive) heart failure: Secondary | ICD-10-CM | POA: Insufficient documentation

## 2015-03-15 DIAGNOSIS — Z9889 Other specified postprocedural states: Secondary | ICD-10-CM | POA: Diagnosis not present

## 2015-03-15 MED ORDER — PREDNISONE 20 MG PO TABS: ORAL_TABLET | ORAL | Status: DC

## 2015-03-15 MED ORDER — POLYETHYLENE GLYCOL 3350 17 GM/SCOOP PO POWD: 17.0000 g | Freq: Two times a day (BID) | ORAL | Status: DC

## 2015-03-15 NOTE — ED Notes (Signed)
Pt c/o right foot pain x 4 weeks, states she has gout, denies trauma. Reports that her pain was previously managed with a narcotics prescription but that she ran out of the medication.

## 2015-03-15 NOTE — Discharge Instructions (Signed)
Please take your medications as prescribed. Please follow-up with your podiatrist within the next 3 days for reevaluation and medication reconciliation. Return to ED for worsening symptoms.

## 2015-03-15 NOTE — ED Provider Notes (Signed)
CSN: [id number]     Arrival date & time 03/15/15  9767 History   First MD Initiated Contact with Patient 03/15/15 [phone]     Chief Complaint  Patient presents with  . Pain  . Gout     (Consider location/radiation/quality/duration/timing/severity/associated sxs/prior Treatment) HPI [name] [name] is a 69 y.o. female history of gout who comes in for evaluation of right foot pain. Patient states she was seen in the ED 2 weeks ago for the same problem, was given pain medicine and it resolved. She reports the pain has returned over the past couple of days. She denies any fevers, chills, leg pain or leg swelling, difficulties breathing, shortness of breath, cough. She does report over the past week she has had "fried [place] loaf every day for breakfast". She does not want to take anymore pain medicines as this has caused her to become constipated. She is requesting a laxative. No other aggravating or modifying factors. Rates her discomfort as a 3/10.  Past Medical History  Diagnosis Date  . Hypertension   . Asthma   . Arthritis   . S/P CABG x 2 with clipping of LA appendage 11/14/2014    LIMA to LAD, SVG to OM, EVH via right thigh  . Acute on chronic combined systolic and diastolic HF (heart failure) 10/30/2014    EF 35-40% with RWMA   . Atrial fibrillation with RVR   . Cardiomyopathy, ischemic   . Left main coronary artery disease 11/04/2014   Past Surgical History  Procedure Laterality Date  . Left heart catheterization with coronary angiogram N/A 11/04/2014    Procedure: LEFT HEART CATHETERIZATION WITH CORONARY ANGIOGRAM;  Surgeon: Troy Sine, MD;  Location: [facility] CATH LAB;  Service: Cardiovascular;  Laterality: N/A;  . Temporary pacemaker insertion  11/04/2014    Procedure: TEMPORARY PACEMAKER INSERTION;  Surgeon: Troy Sine, MD;  Location: [place] CATH LAB;  Service: Cardiovascular;;  . Coronary artery bypass graft N/A 11/14/2014    Procedure: CORONARY ARTERY BYPASS GRAFTING (CABG)TIMES 2 USING  LEFT INTERNAL MAMMARY ARTERY AND RIGHT SAPHENOUS VEIN HARVESTED ENDOSCOPICALLY;  Surgeon: [name], MD;  Location: [place];  Service: Open Heart Surgery;  Laterality: N/A;  . Clipping of atrial appendage N/A 11/14/2014    Procedure: CLIPPING OF ATRIAL APPENDAGE;  Surgeon: [name], MD;  Location: [place];  Service: Open Heart Surgery;  Laterality: N/A;  . Tee without cardioversion N/A 11/14/2014    Procedure: TRANSESOPHAGEAL ECHOCARDIOGRAM (TEE);  Surgeon: [name], MD;  Location: [place];  Service: Open Heart Surgery;  Laterality: N/A;  . Cardioversion N/A 11/18/2014    Procedure: CARDIOVERSION;  Surgeon: [name], MD;  Location: [facility] OR;  Service: Cardiovascular;  Laterality: N/A;   Family History  Problem Relation Age of Onset  . Cancer Mother   . Heart disease Father    History  Substance Use Topics  . Smoking status: Current Some Day Smoker -- 1.00 packs/day for 32 years    Types: Cigarettes  . Smokeless tobacco: Not on file     Comment: occasional puff here and there - 02/09/15  . Alcohol Use: No   OB History    No data available     Review of Systems A 10 point review of systems was completed and was negative except for pertinent positives and negatives as mentioned in the history of present illness     Allergies  Bee venom; Codeine; and Shrimp  Home Medications   Prior to  Admission medications   Medication Sig Start Date End Date Taking? Authorizing Provider  acetaminophen (TYLENOL) 500 MG tablet Take 1,500 mg by mouth every 6 (six) hours as needed for moderate pain.    Historical Provider, MD  amiodarone (PACERONE) 100 MG tablet Take 1 tablet (100 mg total) by mouth daily. 02/09/15   [name], MD  apixaban (ELIQUIS) 2.5 MG TABS tablet Take 1 tablet (2.5 mg total) by mouth 2 (two) times daily. 02/13/15   [name], MD  aspirin EC 81 MG EC tablet Take 1 tablet (81 mg total) by mouth daily. 11/23/14   Donielle Liston Alba, PA-C  atorvastatin  (LIPITOR) 10 MG tablet Take 1 tablet (10 mg total) by mouth daily at 6 PM. 02/13/15   [name], MD  diphenhydrAMINE (BENADRYL) 25 MG tablet Take 12.5 mg by mouth as needed (tongue swell).     Historical Provider, MD  furosemide (LASIX) 40 MG tablet Take 1 tablet by mouth daily as needed for fluid.  01/17/15   Historical Provider, MD  hydrochlorothiazide (MICROZIDE) 12.5 MG capsule Take 1 capsule (12.5 mg total) by mouth daily. 02/13/15   [name], MD  HYDROcodone-acetaminophen (NORCO/VICODIN) 5-325 MG per tablet Take 1-2 tablets by mouth every 4 (four) hours as needed. 02/25/15   Kaitlyn Szekalski, PA-C  losartan (COZAAR) 100 MG tablet Take 1 tablet (100 mg total) by mouth daily. 12/22/14   Erin R Barrett, PA-C  metoprolol tartrate (LOPRESSOR) 25 MG tablet Take 1 tablet (25 mg total) by mouth 2 (two) times daily. 12/22/14   Erin R Barrett, PA-C  nystatin (MYCOSTATIN) powder Apply topically to affected area twice daily. Patient not taking: Reported on 02/25/2015 02/09/15   [name], MD  ondansetron (ZOFRAN ODT) 4 MG disintegrating tablet Take 1 tablet (4 mg total) by mouth every 8 (eight) hours as needed for nausea or vomiting. 02/25/15   Alvina Chou, PA-C  oxyCODONE-acetaminophen (PERCOCET/ROXICET) 5-325 MG per tablet Take 1 tablet by mouth every 4 (four) hours as needed. 02/28/15   Larene Pickett, PA-C  polyethylene glycol powder (GLYCOLAX/MIRALAX) powder Take 17 g by mouth 2 (two) times daily. Until daily soft stools  OTC 03/15/15   Comer Locket, PA-C  potassium chloride SA (K-DUR,KLOR-CON) 20 MEQ tablet Take 10 mEq by mouth daily.    Historical Provider, MD  predniSONE (DELTASONE) 20 MG tablet 2 tabs po for 2 days, then 1 tab po daily x 4 days 03/15/15   Comer Locket, PA-C  ranitidine (ZANTAC) 150 MG tablet Take 75 mg by mouth as needed for heartburn.     Historical Provider, MD  traMADol (ULTRAM) 50 MG tablet Take 1 tablet (50 mg total) by mouth every 6 (six) hours as needed.  12/22/14   Erin R Barrett, PA-C   BP 144/74 mmHg  Pulse 76  Temp(Src) 97.6 F (36.4 C) (Oral)  Resp 18  SpO2 98% Physical Exam  Constitutional: She is oriented to person, place, and time. She appears well-developed and well-nourished.  HENT:  Head: Normocephalic and atraumatic.  Mouth/Throat: Oropharynx is clear and moist.  Eyes: Conjunctivae are normal. Pupils are equal, round, and reactive to light. Right eye exhibits no discharge. Left eye exhibits no discharge. No scleral icterus.  Neck: Neck supple.  Cardiovascular: Normal rate, regular rhythm, normal heart sounds and intact distal pulses.   Pulmonary/Chest: Effort normal and breath sounds normal. No respiratory distress. She has no wheezes. She has no rales.  Abdominal: Soft. There is no  tenderness.  Musculoskeletal: She exhibits no tenderness.  Full range of motion of all joints of lower extremities bilaterally  Neurological: She is alert and oriented to person, place, and time.  Cranial Nerves II-XII grossly intact  Skin: Skin is warm and dry. No rash noted.  Tenderness to palpation over dorsum of right foot. Mildly erythematous and warm to touch. No streaking. Muscle compartment soft, no erythema, no palpable cords. No tenderness along the venous system.  Psychiatric: She has a normal mood and affect.  Nursing note and vitals reviewed.   ED Course  Procedures (including critical care time) Labs Review Labs Reviewed - No data to display  Imaging Review No results found.   EKG Interpretation None     Meds given in ED:  Medications - No data to display  New Prescriptions   POLYETHYLENE GLYCOL POWDER (GLYCOLAX/MIRALAX) POWDER    Take 17 g by mouth 2 (two) times daily. Until daily soft stools  OTC   PREDNISONE (DELTASONE) 20 MG TABLET    2 tabs po for 2 days, then 1 tab po daily x 4 days   Filed Vitals:   03/15/15 0819  BP: 144/74  Pulse: 76  Temp: 97.6 F (36.4 C)  TempSrc: Oral  Resp: 18  SpO2: 98%     MDM  Vitals stable - WNL -afebrile Pt resting comfortably in ED. PE--as above, low suspicion for cellulitis, DVT, hemarthrosis or septic joint. Patient has presented to the ED for this same complaint multiple times. She does have a podiatrist but has not followed up in his office yet. Discussed follow-up is necessary. Exam has been consistent each time she has been evaluated. Suspect this is acute gouty flare versus inflammation around a synovial cyst. Discussed dietary habits appropriate for gout. We'll not initiate coursing therapy in the ED due to unclear etiology and patient is taking amiodarone. No evidence of other acute or emergent pathology at this time. Will DC with short course steroid's and have patient follow-up with podiatry.  I discussed all relevant lab findings and imaging results with pt and they verbalized understanding. Discussed f/u with PCP within 48 hrs and return precautions, pt very amenable to plan. Prior to patient discharge, I discussed and reviewed this case with Dr.Pfeiffer, who also saw and evaluated the patient   Final diagnoses:  Right foot pain        Comer Locket, PA-C 03/15/15 [id number]  Charlesetta Shanks, MD 03/23/15 1459

## 2015-04-02 ENCOUNTER — Ambulatory Visit: Payer: Commercial Managed Care - HMO | Admitting: Internal Medicine

## 2015-04-03 ENCOUNTER — Telehealth: Payer: Self-pay | Admitting: Internal Medicine

## 2015-04-03 NOTE — Telephone Encounter (Signed)
Received records from Dr [facility] for appointment on 05/12/15 with Dr Debara Pickett.  Records given to [name] (medical records) for Dr Lysbeth Penner schedule on 05/12/15. lp

## 2015-04-03 NOTE — Telephone Encounter (Signed)
Returned call to patient she wanted to ask Dr.Hilty if ok to receive injection of Dexamethasone by [facility] Dr.Thomas.Stated she is still having swelling in right foot.Advised Dr.Hilty not in office will send message to him for advice.

## 2015-04-03 NOTE — Telephone Encounter (Signed)
Pt c/o swelling: STAT is pt has developed SOB within 24 hours  1. How long have you been experiencing swelling? 2 month( she said due to the gout)   2. Where is the swelling located? Rt foot   3.  Are you currently taking a "fluid pill"? Yes , Furosemide   4.  Are you currently SOB? No   5.  Have you traveled recently?yes

## 2015-04-06 ENCOUNTER — Ambulatory Visit: Payer: Commercial Managed Care - HMO | Admitting: Internal Medicine

## 2015-04-06 NOTE — Telephone Encounter (Signed)
No problem with ankle injections.  DR. Lemmie Evens

## 2015-04-06 NOTE — Telephone Encounter (Signed)
Returned call to patient Dr.Hilty advised no problem with ankle injections.

## 2015-04-08 ENCOUNTER — Telehealth: Payer: Self-pay | Admitting: Internal Medicine

## 2015-04-08 ENCOUNTER — Other Ambulatory Visit: Payer: Self-pay | Admitting: Physician Assistant

## 2015-04-08 MED ORDER — APIXABAN 2.5 MG PO TABS: 2.5000 mg | ORAL_TABLET | Freq: Two times a day (BID) | ORAL | Status: DC

## 2015-04-08 NOTE — Telephone Encounter (Signed)
Pt need to know asap,is she supposed to be taking Eliquis? Pt is at the pharmacy waiting.

## 2015-04-08 NOTE — Telephone Encounter (Signed)
Returned call to patient. Eliquis 2.5mg  on list. She is almost out of med w/ one pill left. Preference was for mail order pharmacy to fill.  Advised pt that I would send refill to pharmacy. Escribed at time of call. Informed patient that I would provide samples - note, only 5mg  sample boxes available. She stated could come in today to pick up. Instructed pt for samples to cut in half for dose equivalency. She voiced understanding. Advised if any clarification needed or further concerns, to call.

## 2015-05-12 ENCOUNTER — Ambulatory Visit (INDEPENDENT_AMBULATORY_CARE_PROVIDER_SITE_OTHER): Payer: Commercial Managed Care - HMO | Admitting: Internal Medicine

## 2015-05-12 VITALS — BP 132/78 | HR 55 | Ht 66.0 in | Wt 216.3 lb

## 2015-05-12 DIAGNOSIS — I255 Ischemic cardiomyopathy: Secondary | ICD-10-CM

## 2015-05-12 DIAGNOSIS — I5043 Acute on chronic combined systolic (congestive) and diastolic (congestive) heart failure: Secondary | ICD-10-CM

## 2015-05-12 DIAGNOSIS — Z951 Presence of aortocoronary bypass graft: Secondary | ICD-10-CM | POA: Diagnosis not present

## 2015-05-12 DIAGNOSIS — I480 Paroxysmal atrial fibrillation: Secondary | ICD-10-CM

## 2015-05-12 NOTE — Patient Instructions (Addendum)
Your physician has requested that you have a limited echocardiogram @ [address]. Echocardiography is a painless test that uses sound waves to create images of your heart. It provides your doctor with information about the size and shape of your heart and how well your heart's chambers and valves are working. This procedure takes approximately one hour. There are no restrictions for this procedure.  Your physician has recommended you make the following change in your medication: STOP amiodarone. STOP eliquis.   Continue ASPIRIN  Your physician wants you to follow-up in: 6 months with Dr. Debara Pickett. You will receive a reminder letter in the mail two months in advance. If you don't receive a letter, please call our office to schedule the follow-up appointment.

## 2015-05-13 ENCOUNTER — Encounter: Payer: Self-pay | Admitting: Internal Medicine

## 2015-05-13 NOTE — Progress Notes (Signed)
OFFICE NOTE  Chief Complaint:  Occasional dyspnea  Primary Care Physician: [name], MD  HPI:  [name] [name] is a pleasant 69 year old female who I recently saw the hospital when she was in the cardiac care unit. She has a long standing history of Hypertension and tobacco abuse. She was admitted to [facility] 10/29/2014 with a several week history of worsening symptoms of shortness of breath, chest tightness, fatigue, and URI symptoms. She was initially evaluated by her PCP at which time she was noted to be in Atrial Fibrillation with RVR. She was sent to the emergency department for evaluation. EKG confirmed Atrial Fibrillation, Troponin was negative. CXR was obtained and was concerning for pneumonia and she was started on empiric ABX. She also underwent TTE which showed a reduced EF of 35-40%. She had diffuse global hypokinesis especially of the inferior wall. It was felt the patient should undergo cardiac catheterization for further diagnostics, however patient became anxious and would not consent to the procedure. The patient calmed down and in a few days and further consultation she was agreeable to proceed with cardiac catheterization. This was to be done on 11/03/2014 however, the prior to the procedure the patient became agitated, combativeness and shortness of breath. She progressed to full cardiac arrest with asystole without any preceding signs of symptoms. She was successful resuscitated but showed signs of decerebrate posturing and was placed on the cooling protocol. During cooling she continued to have Atrial Fibrillation which progressed to bradycardia. It was again decided she should undergo cardiac catheterization. This was completed on 11/04/2014 and revealed significant coronary artery disease. It was felt coronary bypass grafting would be the treatment of choice and TCTS was consulted. She was evaluated by Dr. Roxy Manns who agreed at some point she would  benefit from Coronary Bypass grafting procedure. However at the time of evaluation the patient remained on a ventilator and was sedated without signs of neurologic recovery. It was felt medical therapy should be continued at this time and if patient awakes and returns to baseline surgery could be reconsidered at that point.The patient was treated medically for several days. She was successfully weaned and later extubated. She continued to have issues with Atrial Fibrillation. She was again evaluated by Dr. Roxy Manns on 11/11/2014 at which time he felt she was medically stable to proceed with Coronary Bypass grafting procedure. The risks and benefits of the procedure were explained to the patient and she was agreeable to proceed. She was taken to the operating room on 11/14/2014. She underwent CABG x 2 utilizing LIMA to LAD, SVG to OM. She also underwent clipping of her Left Atrial Appendage and endoscopic saphenous vein harvest from her right thigh. She tolerated the procedure well and was taken to the SICU in stable condition. The patient was extubated the evening of surgery. During her stay in the SICU she developed Rapid Atrial Fibrillation. She was treated with Amiodarone and Lopressor, but ultimately did not convert to NSR. EP consult was requested and it was felt cardioversion could be attempted. This was completed on POD # 4 and was unsuccessful. However, the patient ultimately converted to NSR on her own. She remains on Lopressor, which has been titrated as tolerated. She was also placed on Eliquis for stroke prevention.  She returns today for follow-up and is feeling quite well. She denies any chest pain or worsening shortness of breath. She is interesting in getting off of amiodarone completely. She seems to be maintaining sinus rhythm at  a rate of 55 with occasional PVCs.  PMHx:  Past Medical History  Diagnosis Date  . Hypertension   . Asthma   . Arthritis   . S/P CABG x 2 with  clipping of LA appendage 11/14/2014    LIMA to LAD, SVG to OM, EVH via right thigh  . Acute on chronic combined systolic and diastolic HF (heart failure) 10/30/2014    EF 35-40% with RWMA   . Atrial fibrillation with RVR   . Cardiomyopathy, ischemic   . Left main coronary artery disease 11/04/2014    Past Surgical History  Procedure Laterality Date  . Left heart catheterization with coronary angiogram N/A 11/04/2014    Procedure: LEFT HEART CATHETERIZATION WITH CORONARY ANGIOGRAM;  Surgeon: Troy Sine, MD;  Location: [facility] CATH LAB;  Service: Cardiovascular;  Laterality: N/A;  . Temporary pacemaker insertion  11/04/2014    Procedure: TEMPORARY PACEMAKER INSERTION;  Surgeon: Troy Sine, MD;  Location: [facility] CATH LAB;  Service: Cardiovascular;;  . Coronary artery bypass graft N/A 11/14/2014    Procedure: CORONARY ARTERY BYPASS GRAFTING (CABG)TIMES 2 USING LEFT INTERNAL MAMMARY ARTERY AND RIGHT SAPHENOUS VEIN HARVESTED ENDOSCOPICALLY;  Surgeon: [name], MD;  Location: [place];  Service: Open Heart Surgery;  Laterality: N/A;  . Clipping of atrial appendage N/A 11/14/2014    Procedure: CLIPPING OF ATRIAL APPENDAGE;  Surgeon: [name], MD;  Location: [place];  Service: Open Heart Surgery;  Laterality: N/A;  . Tee without cardioversion N/A 11/14/2014    Procedure: TRANSESOPHAGEAL ECHOCARDIOGRAM (TEE);  Surgeon: [name], MD;  Location: [place];  Service: Open Heart Surgery;  Laterality: N/A;  . Cardioversion N/A 11/18/2014    Procedure: CARDIOVERSION;  Surgeon: [name], MD;  Location: [facility] OR;  Service: Cardiovascular;  Laterality: N/A;    FAMHx:  Family History  Problem Relation Age of Onset  . Cancer Mother   . Heart disease Father     SOCHx:   reports that she has been smoking Cigarettes.  She has a 32 pack-year smoking history. She does not have any smokeless tobacco history on file. She reports that she does not drink alcohol or use illicit drugs.  ALLERGIES:  Allergies    Allergen Reactions  . Bee Venom Anaphylaxis  . Codeine Nausea And Vomiting  . Shrimp [Shellfish Allergy] Swelling    ROS: A comprehensive review of systems was negative.  HOME MEDS: Current Outpatient Prescriptions  Medication Sig Dispense Refill  . acetaminophen (TYLENOL) 500 MG tablet Take 500 mg by mouth every 6 (six) hours as needed.    Marland Kitchen aspirin EC 81 MG EC tablet Take 1 tablet (81 mg total) by mouth daily.    Marland Kitchen atorvastatin (LIPITOR) 10 MG tablet Take 1 tablet (10 mg total) by mouth daily at 6 PM. 90 tablet 3  . ciprofloxacin (CIPRO) 500 MG tablet Take 1 tablet by mouth 2 (two) times daily. For 7 days    . hydrochlorothiazide (MICROZIDE) 12.5 MG capsule Take 1 capsule (12.5 mg total) by mouth daily. 90 capsule 3  . losartan (COZAAR) 100 MG tablet Take 1 tablet (100 mg total) by mouth daily. 30 tablet 3  . metoprolol tartrate (LOPRESSOR) 25 MG tablet Take 1 tablet (25 mg total) by mouth 2 (two) times daily. 60 tablet 3  . ranitidine (ZANTAC) 150 MG tablet Take 75 mg by mouth as needed for heartburn.     . traMADol (ULTRAM) 50 MG tablet Take 1 tablet (50 mg total) by  mouth every 6 (six) hours as needed. 30 tablet 0  . VENTOLIN HFA 108 (90 BASE) MCG/ACT inhaler Inhale 1 Inhaler into the lungs as needed.     No current facility-administered medications for this visit.    LABS/IMAGING: No results found for this or any previous visit (from the past 48 hour(s)). No results found.  WEIGHTS: Wt Readings from Last 3 Encounters:  05/12/15 216 lb 4.8 oz (98.113 kg)  02/09/15 208 lb 6.4 oz (94.53 kg)  01/26/15 205 lb 8 oz (93.214 kg)    VITALS: BP 132/78 mmHg  Pulse 55  Ht 5\' 6"  (1.676 m)  Wt 216 lb 4.8 oz (98.113 kg)  BMI 34.93 kg/m2  EXAM: General appearance: alert and no distress Neck: no carotid bruit, no JVD and thyroid not enlarged, symmetric, no tenderness/mass/nodules Lungs: clear to auscultation bilaterally Heart: regular rate and rhythm, S1, S2 normal, no murmur,  click, rub or gallop Abdomen: soft, non-tender; bowel sounds normal; no masses,  no organomegaly Extremities: extremities normal, atraumatic, no cyanosis or edema Pulses: 2+ and symmetric Skin: Skin color, texture, turgor normal. No rashes or lesions Neurologic: Grossly normal Psych: Pleasant  EKG: Sinus bradycardia at 55 with occasional PVCs  ASSESSMENT: 1. Coronary artery disease status post CABG 2 with LIMA to LAD and SVG to OM 2. Acute on chronic combined systolic congestive heart failure, EF 35-40% 3. Paroxysmal atrial fibrillation-CHADSVASC 4, on Eliquis - status post left atrial closure 4. Amiodarone therapy 5. Dyslipidemia 6. Candida intertrigo  PLAN: 1.   Mrs. Willhelm has had marked improvements in her symptoms. She's had no recurrent atrial fibrillation that I can tell. She did have treatment for A. fib including left atrial appendage closure. We weaned down her amiodarone to 100 mg daily at her last office visit and I think we can discontinue that completely today. With regards to anticoagulation, it is been now more than 6 months since she underwent left atrial appendage closure and I did not feel there is additional risk for stroke based on the fact that she is maintaining a sinus rhythm and that her atrial appendage is ligated. Therefore we could discontinue her Eliquis and maintain her on low-dose aspirin. We'll also plan to get a repeat echocardiogram to see if her LV function has improved.  Plan to see her back in 6 months.  [name], MD, [facility] Attending Cardiologist [place] [facility] Docia Klar 05/13/2015, 6:34 PM

## 2015-05-20 ENCOUNTER — Ambulatory Visit (HOSPITAL_COMMUNITY): Payer: Commercial Managed Care - HMO | Attending: Cardiology

## 2015-05-20 ENCOUNTER — Other Ambulatory Visit: Payer: Self-pay

## 2015-05-20 DIAGNOSIS — F17200 Nicotine dependence, unspecified, uncomplicated: Secondary | ICD-10-CM | POA: Diagnosis not present

## 2015-05-20 DIAGNOSIS — I5189 Other ill-defined heart diseases: Secondary | ICD-10-CM | POA: Insufficient documentation

## 2015-05-20 DIAGNOSIS — I517 Cardiomegaly: Secondary | ICD-10-CM | POA: Diagnosis not present

## 2015-05-20 DIAGNOSIS — I358 Other nonrheumatic aortic valve disorders: Secondary | ICD-10-CM | POA: Insufficient documentation

## 2015-05-20 DIAGNOSIS — I429 Cardiomyopathy, unspecified: Secondary | ICD-10-CM | POA: Insufficient documentation

## 2015-05-20 DIAGNOSIS — I340 Nonrheumatic mitral (valve) insufficiency: Secondary | ICD-10-CM | POA: Insufficient documentation

## 2015-05-20 DIAGNOSIS — I255 Ischemic cardiomyopathy: Secondary | ICD-10-CM

## 2015-05-20 DIAGNOSIS — I10 Essential (primary) hypertension: Secondary | ICD-10-CM | POA: Diagnosis not present

## 2015-06-11 ENCOUNTER — Other Ambulatory Visit: Payer: Self-pay | Admitting: Physician Assistant

## 2015-06-29 ENCOUNTER — Encounter: Payer: Commercial Managed Care - HMO | Admitting: Thoracic Surgery (Cardiothoracic Vascular Surgery)

## 2015-07-13 ENCOUNTER — Encounter: Payer: Self-pay | Admitting: Thoracic Surgery (Cardiothoracic Vascular Surgery)

## 2015-07-13 ENCOUNTER — Ambulatory Visit (INDEPENDENT_AMBULATORY_CARE_PROVIDER_SITE_OTHER): Payer: Commercial Managed Care - HMO | Admitting: Thoracic Surgery (Cardiothoracic Vascular Surgery)

## 2015-07-13 VITALS — BP 180/90 | HR 66 | Resp 20 | Ht 66.0 in | Wt 216.0 lb

## 2015-07-13 DIAGNOSIS — Z951 Presence of aortocoronary bypass graft: Secondary | ICD-10-CM

## 2015-07-13 NOTE — Patient Instructions (Signed)
Continue all previous medications without any changes at this time  Make every effort to stay physically active, get some type of exercise on a regular basis, and stick to a "heart healthy diet".  The long term benefits for regular exercise and a healthy diet are critically important to your overall health and wellbeing.  

## 2015-07-13 NOTE — Progress Notes (Signed)
[address]       [place] [address]             [phone]     CARDIOTHORACIC SURGERY OFFICE NOTE  Referring Provider is Troy Sine, MD  Primary Cardiologist is Debara Pickett Nadean Corwin, MD PCP is [name], MD   HPI:  Patient returns to the office today for follow-up approximately 6 months s/p coronary artery bypass grafting 2 with clipping of left atrial appendage on 11/14/2014 for severe left main coronary artery disease s/p sudden cardiac arrest.  The patient originally presented 10/29/2014 with symptoms of acute exacerbation of chronic combined systolic and diastolic congestive heart failure with palpitations, chest tightness, and fatigue. She was diagnosed with new onset atrial fibrillation with rapid ventricular response. Prior to catheterization the patient suffered a cardiac arrest and initially plans for catheterization were canceled. However, following resuscitation the patient underwent catheterization demonstrating 80% distal left main coronary artery stenosis and moderate global left ventricular systolic dysfunction.  Postoperatively the patient did experience a recurrence of atrial fibrillation but she spontaneously converted to sinus rhythm.  She was eventually discharged from the hospital without further complications, and she was last seen here in our office on 12/22/2014 at which time she was making steady progress.  Since then she has been followed carefully by Dr. Debara Pickett who saw her most recently on 05/12/2015.  Long-term anticoagulation has been stopped.  Follow-up echocardiogram performed 05/20/2015 demonstrates stable left ventricular systolic function with ejection fraction estimated 35-40%. The patient returns to our office today for follow-up. She states that she has been doing well. She states that she did not take her blood pressure medicines this morning because she was hoping I would stop them. She complains of some of her medications cause  headache. Her appetite is good. She denies any symptoms of chest discomfort or shortness of breath. She has not had any palpitations or dizzy spells. She does complain of sensitivity along the left anterior chest wall that dates back to the time of surgery. She has also been having chronic rash in the crease beneath her left breast.  She has been given a prescription for topical nystatin powder by Dr. Debara Pickett.   Current Outpatient Prescriptions  Medication Sig Dispense Refill  . aspirin EC 81 MG EC tablet Take 1 tablet (81 mg total) by mouth daily.    Marland Kitchen atorvastatin (LIPITOR) 10 MG tablet Take 1 tablet (10 mg total) by mouth daily at 6 PM. 90 tablet 3  . hydrochlorothiazide (MICROZIDE) 12.5 MG capsule Take 1 capsule (12.5 mg total) by mouth daily. 90 capsule 3  . metoprolol tartrate (LOPRESSOR) 25 MG tablet Take 1 tablet (25 mg total) by mouth 2 (two) times daily. 60 tablet 3  . traMADol (ULTRAM) 50 MG tablet Take 1 tablet (50 mg total) by mouth every 6 (six) hours as needed. 30 tablet 0  . acetaminophen (TYLENOL) 500 MG tablet Take 500 mg by mouth every 6 (six) hours as needed.    . ciprofloxacin (CIPRO) 500 MG tablet Take 1 tablet by mouth 2 (two) times daily. For 7 days     No current facility-administered medications for this visit.      Physical Exam:   BP 180/90 mmHg  Pulse 66  Resp 20  Ht 5\' 6"  (1.676 m)  Wt 216 lb (97.977 kg)  BMI 34.88 kg/m2  SpO2 97%  General:  Well-appearing  Chest:   Clear to auscultation,  Mild erythematous rash  in the crease beneath the left breast consistent with Candida cutaneous yeast infection  CV:   Regular rate and rhythm without murmur  Incisions:  Well healed, sternum is stable  Abdomen:  Soft and nontender  Extremities:  Warm and well-perfused  Diagnostic Tests:  n/a   Impression:  Patient remains clinically stable more than 6 months status post coronary artery bypass grafting 2. She reports no symptoms of exertional shortness of breath  or chest discomfort. The patient does report some symptoms of sensitivity along the left anterior chest wall consistent with neuropathic discomfort related to previous left internal mammary artery harvest and grafting. Her sternotomy has healed nicely.  Activity level is reasonably good. Her blood pressure is high today but the patient admits that she did not take her blood pressure medications this morning.  Plan:  The patient has been strongly encouraged to take her blood pressure medications regularly as prescribed. The benefits of regular exercise and weight loss of been discussed at length. We have not recommended any changes to the patient's current medications at this time. All of her questions have been addressed.  In the future the patient will call and return to see [place] as needed.  I spent in excess of 15 minutes during the conduct of this office consultation and >50% of this time involved direct face-to-face encounter with the patient for counseling and/or coordination of their care.    Valentina Gu. Roxy Manns, MD 07/13/2015 3:34 PM

## 2015-07-23 ENCOUNTER — Emergency Department (HOSPITAL_COMMUNITY)
Admission: EM | Admit: 2015-07-23 | Discharge: 2015-07-23 | Disposition: A | Discharge status: Home / Self Care | Payer: Commercial Managed Care - HMO | Attending: Emergency Medicine | Admitting: Emergency Medicine

## 2015-07-23 ENCOUNTER — Encounter (HOSPITAL_COMMUNITY): Payer: Self-pay

## 2015-07-23 ENCOUNTER — Emergency Department (HOSPITAL_COMMUNITY): Payer: Commercial Managed Care - HMO

## 2015-07-23 DIAGNOSIS — Z7982 Long term (current) use of aspirin: Secondary | ICD-10-CM | POA: Insufficient documentation

## 2015-07-23 DIAGNOSIS — Z79899 Other long term (current) drug therapy: Secondary | ICD-10-CM | POA: Insufficient documentation

## 2015-07-23 DIAGNOSIS — M10061 Idiopathic gout, right knee: Secondary | ICD-10-CM | POA: Insufficient documentation

## 2015-07-23 DIAGNOSIS — J45909 Unspecified asthma, uncomplicated: Secondary | ICD-10-CM | POA: Insufficient documentation

## 2015-07-23 DIAGNOSIS — I10 Essential (primary) hypertension: Secondary | ICD-10-CM | POA: Insufficient documentation

## 2015-07-23 DIAGNOSIS — Z950 Presence of cardiac pacemaker: Secondary | ICD-10-CM | POA: Diagnosis not present

## 2015-07-23 DIAGNOSIS — F17210 Nicotine dependence, cigarettes, uncomplicated: Secondary | ICD-10-CM | POA: Diagnosis not present

## 2015-07-23 DIAGNOSIS — Z7952 Long term (current) use of systemic steroids: Secondary | ICD-10-CM | POA: Diagnosis not present

## 2015-07-23 DIAGNOSIS — M109 Gout, unspecified: Secondary | ICD-10-CM

## 2015-07-23 DIAGNOSIS — Z9889 Other specified postprocedural states: Secondary | ICD-10-CM | POA: Insufficient documentation

## 2015-07-23 DIAGNOSIS — M1990 Unspecified osteoarthritis, unspecified site: Secondary | ICD-10-CM | POA: Diagnosis not present

## 2015-07-23 DIAGNOSIS — I5043 Acute on chronic combined systolic (congestive) and diastolic (congestive) heart failure: Secondary | ICD-10-CM | POA: Insufficient documentation

## 2015-07-23 DIAGNOSIS — I2510 Atherosclerotic heart disease of native coronary artery without angina pectoris: Secondary | ICD-10-CM | POA: Diagnosis not present

## 2015-07-23 DIAGNOSIS — Z951 Presence of aortocoronary bypass graft: Secondary | ICD-10-CM | POA: Diagnosis not present

## 2015-07-23 DIAGNOSIS — M25561 Pain in right knee: Secondary | ICD-10-CM | POA: Diagnosis present

## 2015-07-23 MED ORDER — PREDNISONE 20 MG PO TABS: 40.0000 mg | ORAL_TABLET | Freq: Every day | ORAL | Status: DC

## 2015-07-23 MED ORDER — HYDROCODONE-ACETAMINOPHEN 5-325 MG PO TABS: 1.0000 | ORAL_TABLET | Freq: Four times a day (QID) | ORAL | Status: DC | PRN

## 2015-07-23 MED ORDER — HYDROCODONE-ACETAMINOPHEN 5-325 MG PO TABS
1.0000 | ORAL_TABLET | Freq: Once | ORAL | Status: AC
  Administered 2015-07-23: 1 via ORAL
  Filled 2015-07-23: qty 1

## 2015-07-23 MED ORDER — ONDANSETRON 4 MG PO TBDP
4.0000 mg | ORAL_TABLET | Freq: Once | ORAL | Status: AC
  Administered 2015-07-23: 4 mg via ORAL
  Filled 2015-07-23: qty 1

## 2015-07-23 NOTE — Discharge Instructions (Signed)

## 2015-07-23 NOTE — ED Notes (Addendum)
Pt here with gout pain in rt knee.  Hx of same.  Pt was given tramadol for the pain but it makes her nauseated. Pt states nausea has continued although not taking medicine.  Pt denies all other pains but "just feels sick"

## 2015-07-23 NOTE — ED Provider Notes (Signed)
CSN: [id number]     Arrival date & time 07/23/15  1041 History   First MD Initiated Contact with Patient 07/23/15 1314     Chief Complaint  Patient presents with  . Knee Pain  . Nausea     (Consider location/radiation/quality/duration/timing/severity/associated sxs/prior Treatment) HPI Comments: 69 year old female with history of gout, CAD status post CABG, CHF who p/w right knee pain and nausea. The patient states that she has had several days of severe, constant pain in her right knee that is similar to previous gout flares that she has had in her ankle. No redness or warmth on her knee. No trauma. She was given tramadol for the pain but it made her nauseated and she vomited once. She has continued to have mild nausea but no further episodes of vomiting. Of note, she has not taken all of her blood pressure medications today. She denies any fevers or recent illness.  Patient is a 69 y.o. female presenting with knee pain. The history is provided by the patient.  Knee Pain   Past Medical History  Diagnosis Date  . Hypertension   . Asthma   . Arthritis   . S/P CABG x 2 with clipping of LA appendage 11/14/2014    LIMA to LAD, SVG to OM, EVH via right thigh  . Acute on chronic combined systolic and diastolic HF (heart failure) ([place]) 10/30/2014    EF 35-40% with RWMA   . Atrial fibrillation with RVR ([place])   . Cardiomyopathy, ischemic   . Left main coronary artery disease 11/04/2014   Past Surgical History  Procedure Laterality Date  . Left heart catheterization with coronary angiogram N/A 11/04/2014    Procedure: LEFT HEART CATHETERIZATION WITH CORONARY ANGIOGRAM;  Surgeon: Troy Sine, MD;  [facility] C[facility] LAB;  Service: Cardiovascular;  Laterality: N/A;  . Temporary pacemaker insertion  11/04/2014    Procedure: TEMPORARY PACEMAKER INSERTION;  Surgeon: Troy Sine, MD;  Location: [facility] CATH LAB;  Service: Cardiovascular;;  . Coronary artery bypass graft N/A 11/14/2014    Procedure:  CORONARY ARTERY BYPASS GRAFTING (CABG)TIMES 2 USING LEFT INTERNAL MAMMARY ARTERY AND RIGHT SAPHENOUS VEIN HARVESTED ENDOSCOPICALLY;  Surgeon: [name], MD;  Location: [place];  Service: Open Heart Surgery;  Laterality: N/A;  . Clipping of atrial appendage N/A 11/14/2014    Procedure: CLIPPING OF ATRIAL APPENDAGE;  Surgeon: [name], MD;  Location: [place];  Service: Open Heart Surgery;  Laterality: N/A;  . Tee without cardioversion N/A 11/14/2014    Procedure: TRANSESOPHAGEAL ECHOCARDIOGRAM (TEE);  Surgeon: [name], MD;  Location: [place];  Service: Open Heart Surgery;  Laterality: N/A;  . Cardioversion N/A 11/18/2014    Procedure: CARDIOVERSION;  Surgeon: [name], MD;  Location: [facility] OR;  Service: Cardiovascular;  Laterality: N/A;   Family History  Problem Relation Age of Onset  . Cancer Mother   . Heart disease Father    Social History  Substance Use Topics  . Smoking status: Current Some Day Smoker -- 1.00 packs/day for 32 years    Types: Cigarettes  . Smokeless tobacco: None     Comment: occasional puff here and there - 02/09/15  . Alcohol Use: No   OB History    No data available     Review of Systems  10 Systems reviewed and are negative for acute change except as noted in the HPI.   Allergies  Bee venom; Codeine; and Shrimp  Home Medications   Prior to  Admission medications   Medication Sig Start Date End Date Taking? Authorizing Provider  acetaminophen (TYLENOL) 500 MG tablet Take 500 mg by mouth every 6 (six) hours as needed for moderate pain.    Yes Historical Provider, MD  aspirin EC 81 MG EC tablet Take 1 tablet (81 mg total) by mouth daily. 11/23/14  Yes Donielle Liston Alba, PA-C  atorvastatin (LIPITOR) 10 MG tablet Take 1 tablet (10 mg total) by mouth daily at 6 PM. 02/13/15  Yes [name], MD  glycerin adult (GLYCERIN ADULT) 2 G SUPP Place 1 suppository rectally daily as needed for moderate constipation.   Yes Historical Provider, MD   hydrochlorothiazide (MICROZIDE) 12.5 MG capsule Take 1 capsule (12.5 mg total) by mouth daily. 02/13/15  Yes [name], MD  losartan (COZAAR) 100 MG tablet Take 100 mg by mouth daily.   Yes Historical Provider, MD  metoprolol tartrate (LOPRESSOR) 25 MG tablet Take 1 tablet (25 mg total) by mouth 2 (two) times daily. 12/22/14  Yes Erin R Barrett, PA-C  HYDROcodone-acetaminophen (NORCO/VICODIN) 5-325 MG tablet Take 1 tablet by mouth every 6 (six) hours as needed. 07/23/15   Sharlett Iles, MD  predniSONE (DELTASONE) 20 MG tablet Take 2 tablets (40 mg total) by mouth daily. Take 40 mg (2 tablets) by mouth daily for 3 days, then 20mg  (1 tablet) by mouth daily for 3 days, then 10mg  (half tablet) daily for 3 days 07/23/15   Sharlett Iles, MD  traMADol (ULTRAM) 50 MG tablet Take 1 tablet (50 mg total) by mouth every 6 (six) hours as needed. Patient not taking: Reported on 07/23/2015 12/22/14   Erin R Barrett, PA-C   BP 180/108 mmHg  Pulse 81  Temp(Src) 98.1 F (36.7 C) (Oral)  Resp 16  SpO2 95% Physical Exam  Constitutional: She is oriented to person, place, and time. She appears well-developed and well-nourished. No distress.  HENT:  Head: Normocephalic and atraumatic.  Moist mucous membranes  Eyes: Conjunctivae are normal. Pupils are equal, round, and reactive to light.  Neck: Neck supple.  Cardiovascular: Normal rate, regular rhythm, normal heart sounds and intact distal pulses.   No murmur heard. Pulmonary/Chest: Effort normal and breath sounds normal.  Abdominal: Soft. Bowel sounds are normal. She exhibits no distension. There is no tenderness.  Musculoskeletal:  Edema of R knee and pain w/ passive ROM, no erythema or warmth  Neurological: She is alert and oriented to person, place, and time.  Fluent speech  Skin: Skin is warm and dry. No rash noted. No erythema.  Psychiatric: She has a normal mood and affect. Judgment normal.  Nursing note and vitals reviewed.   ED Course   Procedures (including critical care time) Labs Review Labs Reviewed - No data to display  Imaging Review Dg Knee 2 Views Right  07/23/2015  CLINICAL DATA:  Gout in the right knee. EXAM: RIGHT KNEE - 1-2 VIEW COMPARISON:  09/16/2011 FINDINGS: There is no evidence of fracture, dislocation, or joint effusion. No erosive changes noted. Mild degenerative tibial spine spurring without detectable joint narrowing (patient could not stand for weight-bearing view). Medial and distal thigh soft tissue clips. IMPRESSION: No acute bony finding or change from 2013.  No joint effusion. Electronically Signed   By: Monte Fantasia M.D.   On: 07/23/2015 11:47     EKG Interpretation   Date/Time:  Thursday July 23 2015 11:02:22 EST Ventricular Rate:  74 PR Interval:  129 QRS Duration: 107 QT Interval:  422 QTC  Calculation: 468 R Axis:   24 Text Interpretation:  Sinus rhythm Multiform ventricular premature  complexes Probable left atrial enlargement Abnormal T, consider ischemia,  diffuse leads Baseline wander in lead(s) V2 V3 V4 V5 V6 No significant  change since last tracing Confirmed by Kadi Hession MD, Lenita Peregrina [phone]) on  07/23/2015 2:12:07 PM       Medications  HYDROcodone-acetaminophen (NORCO/VICODIN) 5-325 MG per tablet 1 tablet (1 tablet Oral Given 07/23/15 1418)  ondansetron (ZOFRAN-ODT) disintegrating tablet 4 mg (4 mg Oral Given 07/23/15 1420)    MDM   Final diagnoses:  Acute gout of right knee, unspecified cause    Patient with history of gout who presents with several days of right knee pain similar to previous Episodes. On exam, the knee is swollen without any warmth or redness. No reports of fever. Patient reports nausea are related to taking tramadol. She has had no vomiting here in the emergency department. Plain film of knee is negative. Patient has no hx of diabetes, therefore provided with prednisone course instead of NSAIDs given the patient's age. Provided with Norco to use at home  for pain. Extensively reviewed return precautions including any signs of infection or worsening symptoms. Patient voiced understanding and was discharged in satisfactory condition.  Sharlett Iles, MD 07/23/15 [phone]

## 2015-08-12 ENCOUNTER — Other Ambulatory Visit: Payer: Self-pay | Admitting: Physician Assistant

## 2015-08-28 ENCOUNTER — Encounter: Payer: Self-pay | Admitting: Physician Assistant

## 2015-08-28 ENCOUNTER — Ambulatory Visit (INDEPENDENT_AMBULATORY_CARE_PROVIDER_SITE_OTHER): Payer: Commercial Managed Care - HMO | Admitting: Physician Assistant

## 2015-08-28 VITALS — BP 146/90 | HR 46 | Ht 67.5 in | Wt 218.0 lb

## 2015-08-28 DIAGNOSIS — R072 Precordial pain: Secondary | ICD-10-CM

## 2015-08-28 DIAGNOSIS — I2583 Coronary atherosclerosis due to lipid rich plaque: Secondary | ICD-10-CM

## 2015-08-28 DIAGNOSIS — I10 Essential (primary) hypertension: Secondary | ICD-10-CM

## 2015-08-28 DIAGNOSIS — I2510 Atherosclerotic heart disease of native coronary artery without angina pectoris: Secondary | ICD-10-CM | POA: Diagnosis not present

## 2015-08-28 MED ORDER — NICOTINE 14 MG/24HR TD PT24: 14.0000 mg | MEDICATED_PATCH | Freq: Every day | TRANSDERMAL | Status: DC

## 2015-08-28 MED ORDER — CARVEDILOL 6.25 MG PO TABS: 6.2500 mg | ORAL_TABLET | Freq: Two times a day (BID) | ORAL | Status: DC

## 2015-08-28 NOTE — Progress Notes (Addendum)
Cardiology Office Note   Date:  08/28/2015   ID:  DORAL NJOKU, [name] 08-Apr-1946, MRN [id number]  PCP:  [name], MD  Cardiologist:  Dr Marchelle Folks, PA-C   Chief Complaint  Patient presents with  . Follow-up    History of Present Illness: [name] [name] is a 70 y.o. female with a history of HTN, tob use.  Admit 10/2014 w/ SOB>>PAF>>EF 40%>>cath initially refused>>asystolic cardiac arrest>>cooled>> cath w/ CABG 11/2014 (LIMA-LAD, SVG-OM) w/ LAA clipping>>rapid afib>>amio >>DCCV >>afib >> spontaneous conversion to SR, S-D-CHF. Eliquis anticoag for CHADS2VASC=4.  [name] [name] presents for follow-up and and evaluation of chest pain.  [name] [name] feels that she has never really gotten back in shape since she had her surgery. She has some chronic dyspnea on exertion that has not changed recently. She denies lower extremity edema, orthopnea and PND.  [name] [name] wants to know if there is any way she can attend cardiac rehabilitation.  She is still smoking but would like to quit. She wonders if she could take a medicine for it.  She has been lightheaded a couple of times. She doesn't remember exactly when these episodes occurred and they may have been orthostatic in nature.  She has an area of tenderness on her left breast approximately 5 cm lateral to her incision. That has been there since the surgery. There is no knot, bleeding or drainage.  She says generic medications are giving her a headache every day. She does not wake up with a headache and her blood pressure is elevated because she has not yet had her morning medications. However, after she takes them, she will get a headache in the back of her head because around both sides of her head up to the front. This is controlled by Tylenol.  She was to take name brand medications instead of generics and wants to know if there is an assistance program to help her with this as she cannot afford it.   Past Medical History    Diagnosis Date  . Hypertension   . Asthma   . Arthritis   . S/P CABG x 2 with clipping of LA appendage 11/14/2014    LIMA to LAD, SVG to OM, EVH via right thigh  . Acute on chronic combined systolic and diastolic HF (heart failure) ([place]) 10/30/2014    EF 35-40% with RWMA   . Atrial fibrillation with RVR ([place])   . Cardiomyopathy, ischemic   . Left main coronary artery disease 11/04/2014    Past Surgical History  Procedure Laterality Date  . Left heart catheterization with coronary angiogram N/A 11/04/2014    Procedure: LEFT HEART CATHETERIZATION WITH CORONARY ANGIOGRAM;  Surgeon: Troy Sine, MD;  [facility] LAB;  Service: Cardiovascular;  Laterality: N/A;  . Temporary pacemaker insertion  11/04/2014    Procedure: TEMPORARY PACEMAKER INSERTION;  Surgeon: Troy Sine, MD;  Location: [facility] CATH LAB;  Service: Cardiovascular;;  . Coronary artery bypass graft N/A 11/14/2014    Procedure: CORONARY ARTERY BYPASS GRAFTING (CABG)TIMES 2 USING LEFT INTERNAL MAMMARY ARTERY AND RIGHT SAPHENOUS VEIN HARVESTED ENDOSCOPICALLY;  Surgeon: [name], MD;  Location: [place];  Service: Open Heart Surgery;  Laterality: N/A;  . Clipping of atrial appendage N/A 11/14/2014    Procedure: CLIPPING OF ATRIAL APPENDAGE;  Surgeon: [name], MD;  Location: [place];  Service: Open Heart Surgery;  Laterality: N/A;  . Tee without cardioversion N/A 11/14/2014    Procedure: TRANSESOPHAGEAL  ECHOCARDIOGRAM (TEE);  Surgeon: [name], MD;  Location: [place];  Service: Open Heart Surgery;  Laterality: N/A;  . Cardioversion N/A 11/18/2014    Procedure: CARDIOVERSION;  Surgeon: [name], MD;  Location: [facility] OR;  Service: Cardiovascular;  Laterality: N/A;    Current Outpatient Prescriptions  Medication Sig Dispense Refill  . acetaminophen (TYLENOL) 500 MG tablet Take 500 mg by mouth every 6 (six) hours as needed for moderate pain.     Marland Kitchen aspirin EC 81 MG EC tablet Take 1 tablet (81 mg total) by mouth daily.     Marland Kitchen atorvastatin (LIPITOR) 10 MG tablet Take 1 tablet (10 mg total) by mouth daily at 6 PM. 90 tablet 3  . glycerin adult (GLYCERIN ADULT) 2 G SUPP Place 1 suppository rectally daily as needed for moderate constipation.    Marland Kitchen losartan (COZAAR) 100 MG tablet Take 100 mg by mouth daily.    . metoprolol succinate (TOPROL-XL) 50 MG 24 hr tablet Take 50 mg by mouth daily.    . traMADol (ULTRAM) 50 MG tablet Take 1 tablet (50 mg total) by mouth every 6 (six) hours as needed. 30 tablet 0  . triamterene-hydrochlorothiazide (DYAZIDE) 37.5-25 MG capsule Take 1 capsule by mouth daily.     No current facility-administered medications for this visit.    Allergies:   Bee venom; Codeine; and Shrimp    Social History:  The patient  reports that she has been smoking Cigarettes.  She has a 32 pack-year smoking history. She does not have any smokeless tobacco history on file. She reports that she does not drink alcohol or use illicit drugs.   Family History:  [name] family history includes Cancer in her mother; Heart disease in her father.    ROS:  Please see the history of present illness. All other systems are reviewed and negative.    PHYSICAL EXAM: VS:  BP 146/90 mmHg  Pulse 46  Ht 5' 7.5" (1.715 m)  Wt 218 lb (98.884 kg)  BMI 33.62 kg/m2 , BMI Body mass index is 33.62 kg/(m^2). GEN: Well nourished, well developed, female in no acute distress HEENT: normal for age  Neck: no JVD, no carotid bruit, no masses Cardiac: RRR; no murmur, no rubs, or gallops Respiratory:  clear to auscultation bilaterally, normal work of breathing. GI: soft, nontender, nondistended, + BS MS: no deformity or atrophy; no edema; distal pulses are 2+ in all 4 extremities  Skin: warm and dry, no rash; there is an area of her left breast that she states is tender to palpation but there are no lesions in this area, her incision is well healed and there is no ecchymosis or hematoma Neuro:  Strength and sensation are  intact Psych: euthymic mood, full affect   EKG:  EKG is not ordered today   Recent Labs: 10/29/2014: TSH 1.152 10/30/2014: B Natriuretic Peptide 202.1* 11/12/2014: ALT 66* 11/15/2014: Magnesium 1.0* 01/26/2015: BUN 20; Creat 1.40*; Hemoglobin 13.9; Platelets 210; Potassium 3.6; Sodium 143    Lipid Panel    Component Value Date/Time   CHOL 116 11/12/2014 0432   TRIG 111 11/12/2014 0432   HDL 34* 11/12/2014 0432   CHOLHDL 3.4 11/12/2014 0432   VLDL 22 11/12/2014 0432   LDLCALC 60 11/12/2014 0432     Wt Readings from Last 3 Encounters:  08/28/15 218 lb (98.884 kg)  07/13/15 216 lb (97.977 kg)  05/12/15 216 lb 4.8 oz (98.113 kg)     Other studies Reviewed: Additional studies/  records that were reviewed today include: Office notes and hospital records..  ASSESSMENT AND PLAN:  1.  Chest pain: Her symptoms are atypical. She is encouraged to take Tylenol for this but no further cardiac testing is needed at this time.   2. CAD: She is not having any clearly ischemic symptoms. She is on aspirin, statin, beta blocker and an ARB. The beta blocker needs to be changed because of bradycardia, but it will be continued if possible.  3. Bradycardia: She has been getting a little lightheaded at times and today in the office, her heart rate is 46. It is regular. We will change her metoprolol 50 mg twice a day to Coreg 6.25 mg twice a day.  4. Headache: I advised her that because her heart rate was low, I was willing to change her beta blocker. However, I recommend that we change one medication at a time to try and figure out which medication is giving her the problem. Compliance with medications for her hypertension and controlling the headaches with Tylenol is recommended. [name] [name] states she has an appointment with a neurologist.  5. Tobacco use: She is interested in taking medications to help her quit smoking. However, because of the side effects she is having from some the medicines she is on  now, I prefer to just give her a nicotine patch. She was strongly advised that she must not smoke while wearing it. If Dr. Deforest Hoyles wishes to use Wellbutrin or Chantix, that would be acceptable also.   Current medicines are reviewed at length with the patient today.  The patient has concerns regarding medicines. Concerns were addressed  The following changes have been made:  DC metoprolol, add carvedilol  Labs/ tests ordered today include:  No orders of the defined types were placed in this encounter.     Disposition:   FU with Dr. Debara Pickett  Signed, Lenoard Aden  08/28/2015 9:38 AM    [address], [place], [place]  [address] Phone: [phone]; Fax: [phone]

## 2015-08-28 NOTE — Patient Instructions (Addendum)
Your physician has recommended you make the following change in your medication:   1.) start new prescription for carvedilol. This replaces the metoprolol ( lopressor)  2.) you have been prescribed a nicotine patch. YOU CANNOT SMOKE WHILE USING THIS PATCH.  You have been referred to [facility]  cardiac rehab.  Your physician wants you to follow-up in: 6 months or sooner if needed with Dr Debara Pickett. You will receive a reminder letter in the mail two months in advance. If you don't receive a letter, please call our office to schedule the follow-up appointment.  If you need a refill on your cardiac medications before your next appointment, please call your pharmacy.

## 2015-09-07 ENCOUNTER — Other Ambulatory Visit: Payer: Self-pay | Admitting: Specialist

## 2015-09-07 DIAGNOSIS — R51 Headache: Principal | ICD-10-CM

## 2015-09-07 DIAGNOSIS — R519 Headache, unspecified: Secondary | ICD-10-CM

## 2015-09-07 DIAGNOSIS — G8929 Other chronic pain: Secondary | ICD-10-CM

## 2015-09-09 ENCOUNTER — Ambulatory Visit
Admission: RE | Admit: 2015-09-09 | Discharge: 2015-09-09 | Disposition: A | Discharge status: Home / Self Care | Payer: Commercial Managed Care - HMO | Source: Ambulatory Visit | Attending: Specialist | Admitting: Specialist

## 2015-09-09 DIAGNOSIS — R519 Headache, unspecified: Secondary | ICD-10-CM

## 2015-09-09 DIAGNOSIS — G8929 Other chronic pain: Secondary | ICD-10-CM

## 2015-09-09 DIAGNOSIS — R51 Headache: Principal | ICD-10-CM

## 2015-09-23 ENCOUNTER — Other Ambulatory Visit: Payer: Self-pay

## 2015-09-23 ENCOUNTER — Ambulatory Visit (INDEPENDENT_AMBULATORY_CARE_PROVIDER_SITE_OTHER): Payer: Commercial Managed Care - HMO | Admitting: Internal Medicine

## 2015-09-23 ENCOUNTER — Encounter: Payer: Self-pay | Admitting: Internal Medicine

## 2015-09-23 VITALS — BP 156/98 | HR 77 | Ht 67.0 in | Wt 221.0 lb

## 2015-09-23 DIAGNOSIS — I480 Paroxysmal atrial fibrillation: Secondary | ICD-10-CM | POA: Diagnosis not present

## 2015-09-23 DIAGNOSIS — I5042 Chronic combined systolic (congestive) and diastolic (congestive) heart failure: Secondary | ICD-10-CM | POA: Diagnosis not present

## 2015-09-23 DIAGNOSIS — I429 Cardiomyopathy, unspecified: Secondary | ICD-10-CM

## 2015-09-23 DIAGNOSIS — N6320 Unspecified lump in the left breast, unspecified quadrant: Secondary | ICD-10-CM | POA: Insufficient documentation

## 2015-09-23 DIAGNOSIS — N644 Mastodynia: Secondary | ICD-10-CM

## 2015-09-23 DIAGNOSIS — N63 Unspecified lump in breast: Secondary | ICD-10-CM

## 2015-09-23 DIAGNOSIS — Z951 Presence of aortocoronary bypass graft: Secondary | ICD-10-CM

## 2015-09-23 NOTE — Progress Notes (Signed)
OFFICE NOTE  Chief Complaint:  Left breast pain  Primary Care Physician: [name], MD  HPI:  [name] [name] is a pleasant 70 year old female who I recently saw the hospital when she was in the cardiac care unit. She has a long standing history of Hypertension and tobacco abuse. She was admitted to [facility] 10/29/2014 with a several week history of worsening symptoms of shortness of breath, chest tightness, fatigue, and URI symptoms. She was initially evaluated by her PCP at which time she was noted to be in Atrial Fibrillation with RVR. She was sent to the emergency department for evaluation. EKG confirmed Atrial Fibrillation, Troponin was negative. CXR was obtained and was concerning for pneumonia and she was started on empiric ABX. She also underwent TTE which showed a reduced EF of 35-40%. She had diffuse global hypokinesis especially of the inferior wall. It was felt the patient should undergo cardiac catheterization for further diagnostics, however patient became anxious and would not consent to the procedure. The patient calmed down and in a few days and further consultation she was agreeable to proceed with cardiac catheterization. This was to be done on 11/03/2014 however, the prior to the procedure the patient became agitated, combativeness and shortness of breath. She progressed to full cardiac arrest with asystole without any preceding signs of symptoms. She was successful resuscitated but showed signs of decerebrate posturing and was placed on the cooling protocol. During cooling she continued to have Atrial Fibrillation which progressed to bradycardia. It was again decided she should undergo cardiac catheterization. This was completed on 11/04/2014 and revealed significant coronary artery disease. It was felt coronary bypass grafting would be the treatment of choice and TCTS was consulted. She was evaluated by Dr. Roxy Manns who agreed at some point she would  benefit from Coronary Bypass grafting procedure. However at the time of evaluation the patient remained on a ventilator and was sedated without signs of neurologic recovery. It was felt medical therapy should be continued at this time and if patient awakes and returns to baseline surgery could be reconsidered at that point.The patient was treated medically for several days. She was successfully weaned and later extubated. She continued to have issues with Atrial Fibrillation. She was again evaluated by Dr. Roxy Manns on 11/11/2014 at which time he felt she was medically stable to proceed with Coronary Bypass grafting procedure. The risks and benefits of the procedure were explained to the patient and she was agreeable to proceed. She was taken to the operating room on 11/14/2014. She underwent CABG x 2 utilizing LIMA to LAD, SVG to OM. She also underwent clipping of her Left Atrial Appendage and endoscopic saphenous vein harvest from her right thigh. She tolerated the procedure well and was taken to the SICU in stable condition. The patient was extubated the evening of surgery. During her stay in the SICU she developed Rapid Atrial Fibrillation. She was treated with Amiodarone and Lopressor, but ultimately did not convert to NSR. EP consult was requested and it was felt cardioversion could be attempted. This was completed on POD # 4 and was unsuccessful. However, the patient ultimately converted to NSR on her own. She remains on Lopressor, which has been titrated as tolerated. She was also placed on Eliquis for stroke prevention.  She returns today for follow-up and is feeling quite well. She denies any chest pain or worsening shortness of breath. She is interesting in getting off of amiodarone completely. She seems to be maintaining sinus rhythm  at a rate of 55 with occasional PVCs.  [name] [name] returns today for follow-up. She denies any chest pain but is reporting some tenderness and heaviness in  the left breast. She feels that it somewhat enlarged and feels heavier than the right side. It was before thought that her pain in the left chest may be related to her sternotomy however her symptoms are worsening. She denies any nipple discharge or skin changes in that area. She had been struggling with a Candida intertrigo which was successfully treated with topical medications. In addition she has some chronic back pain which I think is unrelated. Recently she saw a nurse practitioner and was placed on carvedilol as an alternative to her metoprolol. She seems to be tolerating this fairly well. Heart rate is now higher than it had been previously.  PMHx:  Past Medical History  Diagnosis Date  . Hypertension   . Asthma   . Arthritis   . S/P CABG x 2 with clipping of LA appendage 11/14/2014    LIMA to LAD, SVG to OM, EVH via right thigh  . Acute on chronic combined systolic and diastolic HF (heart failure) ([place]) 10/30/2014    EF 35-40% with RWMA   . Atrial fibrillation with RVR ([place])   . Cardiomyopathy, ischemic   . Left main coronary artery disease 11/04/2014    Past Surgical History  Procedure Laterality Date  . Left heart catheterization with coronary angiogram N/A 11/04/2014    Procedure: LEFT HEART CATHETERIZATION WITH CORONARY ANGIOGRAM;  Surgeon: Troy Sine, MD;  Location: [facility] CATH LAB;  Service: Cardiovascular;  Laterality: N/A;  . Temporary pacemaker insertion  11/04/2014    Procedure: TEMPORARY PACEMAKER INSERTION;  Surgeon: Troy Sine, MD;  Location: [facility] CATH LAB;  Service: Cardiovascular;;  . Coronary artery bypass graft N/A 11/14/2014    Procedure: CORONARY ARTERY BYPASS GRAFTING (CABG)TIMES 2 USING LEFT INTERNAL MAMMARY ARTERY AND RIGHT SAPHENOUS VEIN HARVESTED ENDOSCOPICALLY;  Surgeon: [name], MD;  Location: [place];  Service: Open Heart Surgery;  Laterality: N/A;  . Clipping of atrial appendage N/A 11/14/2014    Procedure: CLIPPING OF ATRIAL APPENDAGE;  Surgeon: [name], MD;  Location: [place];  Service: Open Heart Surgery;  Laterality: N/A;  . Tee without cardioversion N/A 11/14/2014    Procedure: TRANSESOPHAGEAL ECHOCARDIOGRAM (TEE);  Surgeon: [name], MD;  Location: [place];  Service: Open Heart Surgery;  Laterality: N/A;  . Cardioversion N/A 11/18/2014    Procedure: CARDIOVERSION;  Surgeon: [name], MD;  Location: [facility] OR;  Service: Cardiovascular;  Laterality: N/A;    FAMHx:  Family History  Problem Relation Age of Onset  . Cancer Mother   . Heart disease Father     SOCHx:   reports that she has been smoking Cigarettes.  She has a 32 pack-year smoking history. She does not have any smokeless tobacco history on file. She reports that she does not drink alcohol or use illicit drugs.  ALLERGIES:  Allergies  Allergen Reactions  . Bee Venom Anaphylaxis  . Codeine Nausea And Vomiting  . Shrimp [Shellfish Allergy] Swelling    ROS: Pertinent items noted in HPI and remainder of comprehensive ROS otherwise negative.  HOME MEDS: Current Outpatient Prescriptions  Medication Sig Dispense Refill  . acetaminophen (TYLENOL) 500 MG tablet Take 500 mg by mouth every 6 (six) hours as needed for moderate pain.     Marland Kitchen aspirin EC 81 MG EC tablet Take 1 tablet (81 mg  total) by mouth daily.    Marland Kitchen atorvastatin (LIPITOR) 10 MG tablet Take 1 tablet (10 mg total) by mouth daily at 6 PM. 90 tablet 3  . carvedilol (COREG) 6.25 MG tablet Take 1 tablet (6.25 mg total) by mouth 2 (two) times daily. 60 tablet 6  . glycerin adult (GLYCERIN ADULT) 2 G SUPP Place 1 suppository rectally daily as needed for moderate constipation.    Marland Kitchen losartan (COZAAR) 100 MG tablet Take 100 mg by mouth daily.    . nicotine (NICODERM CQ - DOSED IN MG/24 HOURS) 14 mg/24hr patch Place 1 patch (14 mg total) onto the skin daily. 28 patch 0  . traMADol (ULTRAM) 50 MG tablet Take 1 tablet (50 mg total) by mouth every 6 (six) hours as needed. 30 tablet 0  . triamterene-hydrochlorothiazide  (DYAZIDE) 37.5-25 MG capsule Take 1 capsule by mouth daily.     No current facility-administered medications for this visit.    LABS/IMAGING: No results found for this or any previous visit (from the past 48 hour(s)). No results found.  WEIGHTS: Wt Readings from Last 3 Encounters:  09/23/15 221 lb (100.245 kg)  08/28/15 218 lb (98.884 kg)  07/13/15 216 lb (97.977 kg)    VITALS: BP 156/98 mmHg  Pulse 77  Ht 5\' 7"  (1.702 m)  Wt 221 lb (100.245 kg)  BMI 34.61 kg/m2  EXAM: General appearance: alert and no distress Neck: no carotid bruit, no JVD and thyroid not enlarged, symmetric, no tenderness/mass/nodules Lungs: clear to auscultation bilaterally Heart: regular rate and rhythm, S1, S2 normal, no murmur, click, rub or gallop Abdomen: soft, non-tender; bowel sounds normal; no masses,  no organomegaly Extremities: extremities normal, atraumatic, no cyanosis or edema Pulses: 2+ and symmetric Skin: Skin color, texture, turgor normal. No rashes or lesions Neurologic: Grossly normal Breasts: The left breast is slightly larger than the right breast with some lumpy tissue but no clear mass, no skin changes or nipple discharge noted  EKG: Sinus rhythm with PACs and inferolateral ST and T wave changes at 77  ASSESSMENT: 1. Coronary artery disease status post CABG 2 with LIMA to LAD and SVG to OM 2. Acute on chronic combined systolic congestive heart failure, EF 35-40% 3. Paroxysmal atrial fibrillation-CHADSVASC 4, on Eliquis - status post left atrial closure 4. Amiodarone therapy 5. Dyslipidemia 6. Candida intertrigo - resolved 7. Left breast heaviness and pain  PLAN: 1.   [name] [name] seems to be doing well and her most recent echocardiogram shows stable cardiomyopathy with EF 35-40%. She's had no recurrent A. fib and did undergo left atrial appendage closure. She was taken off of Eliquis and maintained on aspirin. She's gained about 5 pounds but does not have any signs of worsening  volume overload. She is complaining of some left breast heaviness and shooting pain which has been worsening over the past several months. Her last mammogram was in 2015. I like to repeat that today to make sure there isn't any breast pathology. If this is negative, she may be a candidate for a neuropathic pain medicine if this is related to her sternotomy.  Plan to see her back in 6 months.  [name], MD, [facility] Attending Cardiologist [facility]  Nadean Corwin Dyllan Hughett 09/23/2015, 1:10 PM

## 2015-09-23 NOTE — Patient Instructions (Signed)
Your physician wants you to follow-up in: 6 months or sooner if needed. You will receive a reminder letter in the mail two months in advance. If you don't receive a letter, please call our office to schedule the follow-up appointment.  A mammogram of your left breast has been ordered @ the [facility]. We will contact you with the results.

## 2015-09-28 ENCOUNTER — Ambulatory Visit
Admission: RE | Admit: 2015-09-28 | Discharge: 2015-09-28 | Disposition: A | Discharge status: Home / Self Care | Payer: Commercial Managed Care - HMO | Source: Ambulatory Visit | Attending: Internal Medicine | Admitting: Internal Medicine

## 2015-09-28 DIAGNOSIS — N644 Mastodynia: Secondary | ICD-10-CM

## 2015-10-02 NOTE — Addendum Note (Signed)
Addended by: Janett Labella A on: 10/02/2015 11:57 AM   Modules accepted: Orders

## 2015-11-23 ENCOUNTER — Telehealth: Payer: Self-pay | Admitting: Internal Medicine

## 2015-11-23 NOTE — Telephone Encounter (Signed)
I agree .Marland Kitchen Would defer to PCP for that evaluation.  Dr. Lemmie Evens

## 2015-11-23 NOTE — Telephone Encounter (Signed)
Pt has rash under breast from surgery from " last year", Dr. Debara Pickett gave her some powder and  it's not working, wants to know what's causing it and how to get rid of it, just not feeling well in the am, thinks it's her BP-a little lightheaded--pls call [phone] ok to leave message  [phone]

## 2015-11-23 NOTE — Telephone Encounter (Signed)
No answer. Left message with pt to call back.   

## 2015-11-23 NOTE — Telephone Encounter (Signed)
Called pt back and spoke with her concerning her skin irritation under her breast. She said the medicine she was given was not working anymore and she wonders what is causing it. Encouraged her to contact her PCP about that.   Her other concern was about feeling lightheaded and she also stated she "doesn't feel like herself." Pt does not have a way to take her BP at home. She said the dizziness is in the morning and lasts just a few moments. Sometimes it reoccurs during the day for just a few moments and then goes away especially when she moves around alot. Encouraged her to change positions slowly to allow her BP to become stable with each change in position. Reviewed her medications and she stated she is still taking them as listed.   Will defer to Dr Debara Pickett for further eval if needed.

## 2015-11-25 NOTE — Telephone Encounter (Signed)
Called pt back and no answer. Ok per DPR to leave a message.  Left message telling pt to see her PCP for her issue per MD.

## 2016-01-08 ENCOUNTER — Ambulatory Visit
Admission: RE | Admit: 2016-01-08 | Discharge: 2016-01-08 | Disposition: A | Discharge status: Home / Self Care | Payer: Commercial Managed Care - HMO | Source: Ambulatory Visit | Attending: Internal Medicine | Admitting: Internal Medicine

## 2016-01-08 ENCOUNTER — Other Ambulatory Visit: Payer: Self-pay | Admitting: Internal Medicine

## 2016-01-08 DIAGNOSIS — M25512 Pain in left shoulder: Secondary | ICD-10-CM

## 2016-01-21 ENCOUNTER — Encounter: Payer: Commercial Managed Care - HMO | Admitting: Podiatry

## 2016-02-08 ENCOUNTER — Ambulatory Visit (INDEPENDENT_AMBULATORY_CARE_PROVIDER_SITE_OTHER): Payer: Commercial Managed Care - HMO | Admitting: Internal Medicine

## 2016-02-08 ENCOUNTER — Encounter: Payer: Self-pay | Admitting: Internal Medicine

## 2016-02-08 ENCOUNTER — Telehealth: Payer: Self-pay | Admitting: *Deleted

## 2016-02-08 VITALS — BP 145/89 | HR 60 | Ht 66.0 in | Wt 224.0 lb

## 2016-02-08 DIAGNOSIS — N644 Mastodynia: Secondary | ICD-10-CM | POA: Diagnosis not present

## 2016-02-08 DIAGNOSIS — R200 Anesthesia of skin: Secondary | ICD-10-CM | POA: Insufficient documentation

## 2016-02-08 DIAGNOSIS — M25519 Pain in unspecified shoulder: Secondary | ICD-10-CM | POA: Diagnosis not present

## 2016-02-08 DIAGNOSIS — Z951 Presence of aortocoronary bypass graft: Secondary | ICD-10-CM

## 2016-02-08 DIAGNOSIS — R202 Paresthesia of skin: Secondary | ICD-10-CM

## 2016-02-08 DIAGNOSIS — I10 Essential (primary) hypertension: Secondary | ICD-10-CM | POA: Diagnosis not present

## 2016-02-08 NOTE — Patient Instructions (Signed)
Medication Instructions:  Your physician recommends that you continue on your current medications as directed. Please refer to the Current Medication list given to you today.  Dr Debara Pickett recommends you take a multivitamin daily. Women's One-A-Day is a good choice.  You may use a powder or antiperspirant to keep the area dry below your breast.  You may use Nicorette gum as needed to help you with quitting smoking.   Follow-Up: Your physician wants you to follow-up in: [place]. You will receive a reminder letter in the mail two months in advance. If you don't receive a letter, please call our office to schedule the follow-up appointment.     If you need a refill on your cardiac medications before your next appointment, please call your pharmacy.

## 2016-02-08 NOTE — Progress Notes (Signed)
OFFICE NOTE  Chief Complaint:  Follow-up, arm tingling, back pain, breast pain  Primary Care Physician: [name], MD  HPI:  [name] [name] is a pleasant 70 year old female who I recently saw the hospital when she was in the cardiac care unit. She has a long standing history of Hypertension and tobacco abuse. She was admitted to [facility] 10/29/2014 with a several week history of worsening symptoms of shortness of breath, chest tightness, fatigue, and URI symptoms. She was initially evaluated by her PCP at which time she was noted to be in Atrial Fibrillation with RVR. She was sent to the emergency department for evaluation. EKG confirmed Atrial Fibrillation, Troponin was negative. CXR was obtained and was concerning for pneumonia and she was started on empiric ABX. She also underwent TTE which showed a reduced EF of 35-40%. She had diffuse global hypokinesis especially of the inferior wall. It was felt the patient should undergo cardiac catheterization for further diagnostics, however patient became anxious and would not consent to the procedure. The patient calmed down and in a few days and further consultation she was agreeable to proceed with cardiac catheterization. This was to be done on 11/03/2014 however, the prior to the procedure the patient became agitated, combativeness and shortness of breath. She progressed to full cardiac arrest with asystole without any preceding signs of symptoms. She was successful resuscitated but showed signs of decerebrate posturing and was placed on the cooling protocol. During cooling she continued to have Atrial Fibrillation which progressed to bradycardia. It was again decided she should undergo cardiac catheterization. This was completed on 11/04/2014 and revealed significant coronary artery disease. It was felt coronary bypass grafting would be the treatment of choice and TCTS was consulted. She was evaluated by Dr. Roxy Manns who agreed  at some point she would benefit from Coronary Bypass grafting procedure. However at the time of evaluation the patient remained on a ventilator and was sedated without signs of neurologic recovery. It was felt medical therapy should be continued at this time and if patient awakes and returns to baseline surgery could be reconsidered at that point.The patient was treated medically for several days. She was successfully weaned and later extubated. She continued to have issues with Atrial Fibrillation. She was again evaluated by Dr. Roxy Manns on 11/11/2014 at which time he felt she was medically stable to proceed with Coronary Bypass grafting procedure. The risks and benefits of the procedure were explained to the patient and she was agreeable to proceed. She was taken to the operating room on 11/14/2014. She underwent CABG x 2 utilizing LIMA to LAD, SVG to OM. She also underwent clipping of her Left Atrial Appendage and endoscopic saphenous vein harvest from her right thigh. She tolerated the procedure well and was taken to the SICU in stable condition. The patient was extubated the evening of surgery. During her stay in the SICU she developed Rapid Atrial Fibrillation. She was treated with Amiodarone and Lopressor, but ultimately did not convert to NSR. EP consult was requested and it was felt cardioversion could be attempted. This was completed on POD # 4 and was unsuccessful. However, the patient ultimately converted to NSR on her own. She remains on Lopressor, which has been titrated as tolerated. She was also placed on Eliquis for stroke prevention.  She returns today for follow-up and is feeling quite well. She denies any chest pain or worsening shortness of breath. She is interesting in getting off of amiodarone completely. She seems to  be maintaining sinus rhythm at a rate of 55 with occasional PVCs.  Mrs. Zeidan returns today for follow-up. She denies any chest pain but is reporting some  tenderness and heaviness in the left breast. She feels that it somewhat enlarged and feels heavier than the right side. It was before thought that her pain in the left chest may be related to her sternotomy however her symptoms are worsening. She denies any nipple discharge or skin changes in that area. She had been struggling with a Candida intertrigo which was successfully treated with topical medications. In addition she has some chronic back pain which I think is unrelated. Recently she saw a nurse practitioner and was placed on carvedilol as an alternative to her metoprolol. She seems to be tolerating this fairly well. Heart rate is now higher than it had been previously.  02/08/2016  Mrs. Bonar returns today for follow-up. At her last office visit she was complaining of some breast heaviness and pain and I ordered a mammogram which was negative for any malignancy or masses. She appears to have had resolution of her Candida intertrigo and I've indicated that she could use baby powder or a dry antiperspirant to help with sweating and itching. She continues to have some complaints of numbness and tingling the go down both arms which could be arising from the shoulders or neck. I will refer to orthopedics for this. In addition she reports some shortness of breath and weight gain which she thinks is related to medicines however it seems to be more related to appetite and decreased exercise. She is also back off on her smoking some but has not yet been able to quit.  PMHx:  Past Medical History  Diagnosis Date  . Hypertension   . Asthma   . Arthritis   . S/P CABG x 2 with clipping of LA appendage 11/14/2014    LIMA to LAD, SVG to OM, EVH via right thigh  . Acute on chronic combined systolic and diastolic HF (heart failure) ([place]) 10/30/2014    EF 35-40% with RWMA   . Atrial fibrillation with RVR ([place])   . Cardiomyopathy, ischemic   . Left main coronary artery disease 11/04/2014    Past Surgical  History  Procedure Laterality Date  . Left heart catheterization with coronary angiogram N/A 11/04/2014    Procedure: LEFT HEART CATHETERIZATION WITH CORONARY ANGIOGRAM;  Surgeon: Troy Sine, MD;  Location: [facility] CATH LAB;  Service: Cardiovascular;  Laterality: N/A;  . Temporary pacemaker insertion  11/04/2014    Procedure: TEMPORARY PACEMAKER INSERTION;  Surgeon: Troy Sine, MD;  Location: [facility] CATH LAB;  Service: Cardiovascular;;  . Coronary artery bypass graft N/A 11/14/2014    Procedure: CORONARY ARTERY BYPASS GRAFTING (CABG)TIMES 2 USING LEFT INTERNAL MAMMARY ARTERY AND RIGHT SAPHENOUS VEIN HARVESTED ENDOSCOPICALLY;  Surgeon: [name], MD;  Location: [facility];  Service: Open Heart Surgery;  Laterality: N/A;  . Clipping of atrial appendage N/A 11/14/2014    Procedure: CLIPPING OF ATRIAL APPENDAGE;  Surgeon: [name], MD;  Location: [facility];  Service: Open Heart Surgery;  Laterality: N/A;  . Tee without cardioversion N/A 11/14/2014    Procedure: TRANSESOPHAGEAL ECHOCARDIOGRAM (TEE);  Surgeon: [name], MD;  Location: [facility];  Service: Open Heart Surgery;  Laterality: N/A;  . Cardioversion N/A 11/18/2014    Procedure: CARDIOVERSION;  Surgeon: [name], MD;  Location: [facility] OR;  Service: Cardiovascular;  Laterality: N/A;    FAMHx:  Family History  Problem Relation Age of Onset  . Cancer Mother   . Heart disease Father     SOCHx:   reports that she has been smoking Cigarettes.  She has a 32 pack-year smoking history. She does not have any smokeless tobacco history on file. She reports that she does not drink alcohol or use illicit drugs.  ALLERGIES:  Allergies  Allergen Reactions  . Bee Venom Anaphylaxis  . Codeine Nausea And Vomiting  . Shrimp [Shellfish Allergy] Swelling    ROS: Pertinent items noted in HPI and remainder of comprehensive ROS otherwise negative.  HOME MEDS: Current Outpatient Prescriptions  Medication Sig Dispense Refill  . acetaminophen (TYLENOL)  500 MG tablet Take 500 mg by mouth every 6 (six) hours as needed for moderate pain.     Marland Kitchen aspirin EC 81 MG EC tablet Take 1 tablet (81 mg total) by mouth daily.    Marland Kitchen atorvastatin (LIPITOR) 10 MG tablet Take 1 tablet (10 mg total) by mouth daily at 6 PM. 90 tablet 3  . carvedilol (COREG) 6.25 MG tablet Take 1 tablet (6.25 mg total) by mouth 2 (two) times daily. 60 tablet 6  . glycerin adult (GLYCERIN ADULT) 2 G SUPP Place 1 suppository rectally daily as needed for moderate constipation.    Marland Kitchen losartan (COZAAR) 100 MG tablet Take 100 mg by mouth daily.    . ranitidine (ZANTAC) 150 MG tablet Take 1 tablet by mouth daily.    . traMADol (ULTRAM) 50 MG tablet Take 1 tablet (50 mg total) by mouth every 6 (six) hours as needed. 30 tablet 0  . triamterene-hydrochlorothiazide (DYAZIDE) 37.5-25 MG capsule Take 1 capsule by mouth daily.    Marland Kitchen [name] 40 MG tablet Take 1 tablet by mouth daily.     No current facility-administered medications for this visit.    LABS/IMAGING: No results found for this or any previous visit (from the past 48 hour(s)). No results found.  WEIGHTS: Wt Readings from Last 3 Encounters:  02/08/16 224 lb (101.606 kg)  09/23/15 221 lb (100.245 kg)  08/28/15 218 lb (98.884 kg)    VITALS: BP 145/89 mmHg  Pulse 60  Ht 5\' 6"  (1.676 m)  Wt 224 lb (101.606 kg)  BMI 36.17 kg/m2  SpO2 97%  EXAM: General appearance: alert and no distress Neck: no carotid bruit, no JVD and thyroid not enlarged, symmetric, no tenderness/mass/nodules Lungs: clear to auscultation bilaterally Heart: regular rate and rhythm, S1, S2 normal, no murmur, click, rub or gallop Abdomen: soft, non-tender; bowel sounds normal; no masses,  no organomegaly Extremities: extremities normal, atraumatic, no cyanosis or edema Pulses: 2+ and symmetric Skin: Skin color, texture, turgor normal. No rashes or lesions Neurologic: Grossly normal Breasts: The left breast is slightly larger than the right breast with some  lumpy tissue but no clear mass, no skin changes or nipple discharge noted  EKG: Normal sinus rhythm at 64, ST and T-wave changes  ASSESSMENT: 1. Coronary artery disease status post CABG 2 with LIMA to LAD and SVG to OM 2. Acute on chronic combined systolic congestive heart failure, EF 35-40% 3. Paroxysmal atrial fibrillation-CHADSVASC 4, - status post left atrial closure (not on anticoagulation) 4. Dyslipidemia 5. Candida intertrigo - resolved 6. Left breast heaviness and pain 7. Tobacco abuse 8. Bilateral arm numbness and tingling  PLAN: 1.   Mrs. Niro is complaining of bilateral arm numbness and tingling which I do not believe is related to her coronary disease. May be related to neck or back problems.  I recommend a referral to orthopedics for this. As discussed above for Candida intertrigo has resolved and I've encouraged her to keep this area dry. She can needs to continue to work on tobacco cessation. Her shortness of breath is related to weight gain and inactivity and I've encouraged her to work on that. She is maintaining sinus rhythm and is status post left atrial appendage closure therefore is no longer on anticoagulation.  Plan to see her back in 6 months.  [name], MD, [facility] Attending Cardiologist [name] 02/08/2016, 12:59 PM

## 2016-02-08 NOTE — Telephone Encounter (Signed)
Spoke with patient regarding appointment with orthopedics ----Dr. Gladstone Lighter 02/22/16 @ 3:00 pm---phone [phone].   Office located across the hallway from Dr. Lysbeth Penner office.  Patient voiced her understanding.

## 2016-02-10 ENCOUNTER — Ambulatory Visit: Payer: Commercial Managed Care - HMO | Admitting: Internal Medicine

## 2016-02-19 ENCOUNTER — Emergency Department (HOSPITAL_COMMUNITY): Payer: Commercial Managed Care - HMO

## 2016-02-19 ENCOUNTER — Encounter (HOSPITAL_COMMUNITY): Payer: Self-pay | Admitting: Emergency Medicine

## 2016-02-19 ENCOUNTER — Emergency Department (HOSPITAL_COMMUNITY)
Admission: EM | Admit: 2016-02-19 | Discharge: 2016-02-19 | Disposition: A | Discharge status: Home / Self Care | Payer: Commercial Managed Care - HMO | Attending: Emergency Medicine | Admitting: Emergency Medicine

## 2016-02-19 DIAGNOSIS — M1990 Unspecified osteoarthritis, unspecified site: Secondary | ICD-10-CM | POA: Insufficient documentation

## 2016-02-19 DIAGNOSIS — R51 Headache: Secondary | ICD-10-CM | POA: Insufficient documentation

## 2016-02-19 DIAGNOSIS — R0982 Postnasal drip: Secondary | ICD-10-CM | POA: Insufficient documentation

## 2016-02-19 DIAGNOSIS — I110 Hypertensive heart disease with heart failure: Secondary | ICD-10-CM | POA: Diagnosis not present

## 2016-02-19 DIAGNOSIS — Z951 Presence of aortocoronary bypass graft: Secondary | ICD-10-CM | POA: Diagnosis not present

## 2016-02-19 DIAGNOSIS — I2510 Atherosclerotic heart disease of native coronary artery without angina pectoris: Secondary | ICD-10-CM | POA: Diagnosis not present

## 2016-02-19 DIAGNOSIS — F17210 Nicotine dependence, cigarettes, uncomplicated: Secondary | ICD-10-CM | POA: Insufficient documentation

## 2016-02-19 DIAGNOSIS — H578 Other specified disorders of eye and adnexa: Secondary | ICD-10-CM | POA: Insufficient documentation

## 2016-02-19 DIAGNOSIS — Z950 Presence of cardiac pacemaker: Secondary | ICD-10-CM | POA: Insufficient documentation

## 2016-02-19 DIAGNOSIS — R0602 Shortness of breath: Secondary | ICD-10-CM | POA: Diagnosis not present

## 2016-02-19 DIAGNOSIS — J45909 Unspecified asthma, uncomplicated: Secondary | ICD-10-CM | POA: Diagnosis not present

## 2016-02-19 DIAGNOSIS — I5043 Acute on chronic combined systolic (congestive) and diastolic (congestive) heart failure: Secondary | ICD-10-CM | POA: Diagnosis not present

## 2016-02-19 DIAGNOSIS — R519 Headache, unspecified: Secondary | ICD-10-CM

## 2016-02-19 DIAGNOSIS — I480 Paroxysmal atrial fibrillation: Secondary | ICD-10-CM | POA: Insufficient documentation

## 2016-02-19 DIAGNOSIS — Z79899 Other long term (current) drug therapy: Secondary | ICD-10-CM | POA: Insufficient documentation

## 2016-02-19 DIAGNOSIS — Z7982 Long term (current) use of aspirin: Secondary | ICD-10-CM | POA: Insufficient documentation

## 2016-02-19 LAB — CBC
HCT: 44.5 % (ref 36.0–46.0)
Hemoglobin: 15.3 g/dL — ABNORMAL HIGH (ref 12.0–15.0)
MCH: 29.8 pg (ref 26.0–34.0)
MCHC: 34.4 g/dL (ref 30.0–36.0)
MCV: 86.6 fL (ref 78.0–100.0)
Platelets: 177 10*3/uL (ref 150–400)
RBC: 5.14 MIL/uL — ABNORMAL HIGH (ref 3.87–5.11)
RDW: 14.4 % (ref 11.5–15.5)
WBC: 9.3 10*3/uL (ref 4.0–10.5)

## 2016-02-19 LAB — URINALYSIS, ROUTINE W REFLEX MICROSCOPIC
Bilirubin Urine: NEGATIVE
Glucose, UA: NEGATIVE mg/dL
Hgb urine dipstick: NEGATIVE
Ketones, ur: NEGATIVE mg/dL
Leukocytes, UA: NEGATIVE
Nitrite: NEGATIVE
Protein, ur: NEGATIVE mg/dL
Specific Gravity, Urine: 1.024 (ref 1.005–1.030)
pH: 5.5 (ref 5.0–8.0)

## 2016-02-19 LAB — BASIC METABOLIC PANEL
Anion gap: 7 (ref 5–15)
BUN: 24 mg/dL — ABNORMAL HIGH (ref 6–20)
CO2: 24 mmol/L (ref 22–32)
Calcium: 9.2 mg/dL (ref 8.9–10.3)
Chloride: 109 mmol/L (ref 101–111)
Creatinine, Ser: 1.18 mg/dL — ABNORMAL HIGH (ref 0.44–1.00)
GFR calc Af Amer: 52 mL/min — ABNORMAL LOW (ref >60)
GFR calc non Af Amer: 45 mL/min — ABNORMAL LOW (ref >60)
Glucose, Bld: 108 mg/dL — ABNORMAL HIGH (ref 65–99)
Potassium: 3.2 mmol/L — ABNORMAL LOW (ref 3.5–5.1)
Sodium: 140 mmol/L (ref 135–145)

## 2016-02-19 LAB — CBG MONITORING, ED: Glucose-Capillary: 103 mg/dL — ABNORMAL HIGH (ref 65–99)

## 2016-02-19 LAB — I-STAT TROPONIN, ED: Troponin i, poc: 0.01 ng/mL (ref 0.00–0.08)

## 2016-02-19 LAB — BRAIN NATRIURETIC PEPTIDE: B Natriuretic Peptide: 33.1 pg/mL (ref 0.0–100.0)

## 2016-02-19 MED ORDER — ACETAMINOPHEN 325 MG PO TABS
650.0000 mg | ORAL_TABLET | Freq: Once | ORAL | Status: AC
  Administered 2016-02-19: 650 mg via ORAL
  Filled 2016-02-19: qty 2

## 2016-02-19 NOTE — ED Provider Notes (Signed)
Medical screening examination/treatment/procedure(s) were conducted as a shared visit with non-physician practitioner(s) and myself.  I personally evaluated the patient during the encounter.   EKG Interpretation   Date/Time:  Friday February 19 2016 17:05:26 EDT Ventricular Rate:  69 PR Interval:    QRS Duration: 101 QT Interval:  408 QTC Calculation: 438 R Axis:   40 Text Interpretation:  Sinus rhythm Supraventricular bigeminy Probable left  atrial enlargement Abnormal T, consider ischemia, diffuse leads Since last  tracing T wave abnormality is new Confirmed by Eulis Foster  MD, ELLIOTT [phone])  on 02/19/2016 7:45:18 PM     Patient here complaining of long-standing headache has been right temporal in nature. No visual changes. Head CT is negative. No focal neurological deficits. Patient stable for discharge  Lacretia Leigh, MD 02/19/16 [id number]

## 2016-02-19 NOTE — Discharge Instructions (Signed)
Read the information below.   Make sure that you stay hydrated. Take medication as prescribed. Take your time changing positions. You can take tylenol for headache relief.  It is important that you call your primary care provider and schedule a follow up appointment next week.  You may return to the Emergency Department at any time for worsening condition or any new symptoms that concern you. Return to ED if your symptoms worsen or you develop new symptoms such as fever, chest pain, shortness of breath, facial droop, slurred speech, unilateral weakness, changes in vision, or loss of consciousness.    General Headache Without Cause A headache is pain or discomfort felt around the head or neck area. There are many causes and types of headaches. In some cases, the cause may not be found.  HOME CARE  Managing Pain  Take over-the-counter and prescription medicines only as told by your doctor.  Lie down in a dark, quiet room when you have a headache.  If directed, apply ice to the head and neck area:  Put ice in a plastic bag.  Place a towel between your skin and the bag.  Leave the ice on for 20 minutes, 2-3 times per day.  Use a heating pad or hot shower to apply heat to the head and neck area as told by your doctor.  Keep lights dim if bright lights bother you or make your headaches worse. Eating and Drinking  Eat meals on a regular schedule.  Lessen how much alcohol you drink.  Lessen how much caffeine you drink, or stop drinking caffeine. General Instructions  Keep all follow-up visits as told by your doctor. This is important.  Keep a journal to find out if certain things bring on headaches. For example, write down:  What you eat and drink.  How much sleep you get.  Any change to your diet or medicines.  Relax by getting a massage or doing other relaxing activities.  Lessen stress.  Sit up straight. Do not tighten (tense) your muscles.  Do not use tobacco products.  This includes cigarettes, chewing tobacco, or e-cigarettes. If you need help quitting, ask your doctor.  Exercise regularly as told by your doctor.  Get enough sleep. This often means 7-9 hours of sleep. GET HELP IF:  Your symptoms are not helped by medicine.  You have a headache that feels different than the other headaches.  You feel sick to your stomach (nauseous) or you throw up (vomit).  You have a fever. GET HELP RIGHT AWAY IF:   Your headache becomes really bad.  You keep throwing up.  You have a stiff neck.  You have trouble seeing.  You have trouble speaking.  You have pain in the eye or ear.  Your muscles are weak or you lose muscle control.  You lose your balance or have trouble walking.  You feel like you will pass out (faint) or you pass out.  You have confusion.   This information is not intended to replace advice given to you by your health care provider. Make sure you discuss any questions you have with your health care provider.   Document Released: 05/10/2008 Document Revised: 04/22/2015 Document Reviewed: 11/24/2014 Elsevier Interactive Patient Education [employer].

## 2016-02-19 NOTE — ED Notes (Signed)
Pt c/o headache in the right temporal for 2 days. States she is still recovering from open heart surgery in March 2016. Pt has had headaches on and off since; pt went to a headache specialists and they recommended her quit smoking. Pt states she started taking [name] and thinks that is when her headache began.

## 2016-02-19 NOTE — ED Provider Notes (Signed)
CSN: [id number]     Arrival date & time 02/19/16  1641 History   First MD Initiated Contact with Patient 02/19/16 1956     Chief Complaint  Patient presents with  . Headache     (Consider location/radiation/quality/duration/timing/severity/associated sxs/prior Treatment) HPI Comments: [name] [name] is a 70 y.o. female with h/o HTN, afib, CHF, cardiomyopathy, CAD s/p CABG 11/2014, and candidal intertrigo presents to ED with complaint of intermittent right temporal headache. She has had intermittent headaches off/on over a year. Current headache started yesterday, gradual in onset, 5/10, and improved with extra strength tylenol. She has associated PND, watery eyes, itchy eyes, and some lightheadedness. She also states she has had intermittent numbness in her finertips x 1 month. She also endorses mild SOB. She denies any chest pain today; although she does endorse h/o intermittent chest pains that occur randomly throughout her chest wall that are relieved with tylenol, no discernable pattern. She denies dizziness, facial droop, slurred speech, unilateral weakness. No changes in vision or trouble swallowing. No fever, chills, or night sweats. No leg swelling. She has been evaluated by a headache specialist, she states she received a "painful shot" and won't go back. She recently starting taking [name] for gout and is curious if the medication could be related to her sxs.   The history is provided by the patient and medical records.    Past Medical History  Diagnosis Date  . Hypertension   . Asthma   . Arthritis   . S/P CABG x 2 with clipping of LA appendage 11/14/2014    LIMA to LAD, SVG to OM, EVH via right thigh  . Acute on chronic combined systolic and diastolic HF (heart failure) ([place]) 10/30/2014    EF 35-40% with RWMA   . Atrial fibrillation with RVR ([place])   . Cardiomyopathy, ischemic   . Left main coronary artery disease 11/04/2014   Past Surgical History  Procedure Laterality Date  . Left  heart catheterization with coronary angiogram N/A 11/04/2014    Procedure: LEFT HEART CATHETERIZATION WITH CORONARY ANGIOGRAM;  Surgeon: Troy Sine, MD;  Location: [facility] CATH LAB;  Service: Cardiovascular;  Laterality: N/A;  . Temporary pacemaker insertion  11/04/2014    Procedure: TEMPORARY PACEMAKER INSERTION;  Surgeon: Troy Sine, MD;  Location: [facility] CATH LAB;  Service: Cardiovascular;;  . Coronary artery bypass graft N/A 11/14/2014    Procedure: CORONARY ARTERY BYPASS GRAFTING (CABG)TIMES 2 USING LEFT INTERNAL MAMMARY ARTERY AND RIGHT SAPHENOUS VEIN HARVESTED ENDOSCOPICALLY;  Surgeon: [name], MD;  Location: [place];  Service: Open Heart Surgery;  Laterality: N/A;  . Clipping of atrial appendage N/A 11/14/2014    Procedure: CLIPPING OF ATRIAL APPENDAGE;  Surgeon: [name], MD;  Location: [place];  Service: Open Heart Surgery;  Laterality: N/A;  . Tee without cardioversion N/A 11/14/2014    Procedure: TRANSESOPHAGEAL ECHOCARDIOGRAM (TEE);  Surgeon: [name], MD;  Location: [place];  Service: Open Heart Surgery;  Laterality: N/A;  . Cardioversion N/A 11/18/2014    Procedure: CARDIOVERSION;  Surgeon: [name], MD;  Location: [facility] OR;  Service: Cardiovascular;  Laterality: N/A;   Family History  Problem Relation Age of Onset  . Cancer Mother   . Heart disease Father    Social History  Substance Use Topics  . Smoking status: Current Some Day Smoker -- 1.00 packs/day for 32 years    Types: Cigarettes  . Smokeless tobacco: None     Comment: occasional puff here and  there - 02/09/15  . Alcohol Use: No   OB History    No data available     Review of Systems  HENT: Positive for postnasal drip.   Eyes: Positive for discharge ( watery) and itching.  Respiratory: Positive for shortness of breath.   Musculoskeletal: Positive for neck pain.  Neurological: Positive for light-headedness, numbness (fingertips b/l x 1 month) and headaches (intermittent, not currently).  All other  systems reviewed and are negative.     Allergies  Bee venom; Codeine; and Shrimp  Home Medications   Prior to Admission medications   Medication Sig Start Date End Date Taking? Authorizing Provider  acetaminophen (TYLENOL) 500 MG tablet Take 500 mg by mouth every 6 (six) hours as needed for moderate pain.     Historical Provider, MD  aspirin EC 81 MG EC tablet Take 1 tablet (81 mg total) by mouth daily. 11/23/14   Donielle Liston Alba, PA-C  atorvastatin (LIPITOR) 10 MG tablet Take 1 tablet (10 mg total) by mouth daily at 6 PM. 02/13/15   [name], MD  carvedilol (COREG) 6.25 MG tablet Take 1 tablet (6.25 mg total) by mouth 2 (two) times daily. 08/28/15   Rhonda G Barrett, PA-C  glycerin adult (GLYCERIN ADULT) 2 G SUPP Place 1 suppository rectally daily as needed for moderate constipation.    Historical Provider, MD  losartan (COZAAR) 100 MG tablet Take 100 mg by mouth daily.    Historical Provider, MD  ranitidine (ZANTAC) 150 MG tablet Take 1 tablet by mouth daily. 11/12/15   Historical Provider, MD  traMADol (ULTRAM) 50 MG tablet Take 1 tablet (50 mg total) by mouth every 6 (six) hours as needed. 12/22/14   Erin R Barrett, PA-C  triamterene-hydrochlorothiazide (DYAZIDE) 37.5-25 MG capsule Take 1 capsule by mouth daily. 08/12/15   Historical Provider, MD  [name] 40 MG tablet Take 1 tablet by mouth daily. 01/08/16   Historical Provider, MD   BP 154/93 mmHg  Pulse 65  Temp(Src) 98.6 F (37 C) (Oral)  Resp 18  SpO2 99% Physical Exam  Constitutional: She appears well-developed and well-nourished. No distress.  HENT:  Head: Normocephalic and atraumatic.  Mouth/Throat: Oropharynx is clear and moist. No oropharyngeal exudate.  Eyes: Conjunctivae and EOM are normal. Pupils are equal, round, and reactive to light. Right eye exhibits no discharge. Left eye exhibits no discharge. No scleral icterus.  Neck: Normal range of motion. Neck supple.  Cardiovascular: Normal rate, regular rhythm,  normal heart sounds and intact distal pulses.   No murmur heard. Pulmonary/Chest: Effort normal and breath sounds normal. No respiratory distress.  Abdominal: Soft. Bowel sounds are normal. There is no tenderness. There is no rebound and no guarding.  Musculoskeletal: Normal range of motion.  Lymphadenopathy:    She has no cervical adenopathy.  Neurological: She is alert. She displays a negative Romberg sign. Coordination normal. GCS eye subscore is 4. GCS verbal subscore is 5. GCS motor subscore is 6.  Mental Status:  Alert, thought content appropriate, able to give a coherent history. Speech fluent without evidence of aphasia. Able to follow 2 step commands without difficulty.  Cranial Nerves:  II:  Peripheral visual fields grossly normal, pupils equal, round, reactive to light III,IV, VI: ptosis not present, extra-ocular motions intact bilaterally  V,VII: smile symmetric, facial light touch sensation equal VIII: hearing grossly normal to voice  X: uvula elevates symmetrically  XI: bilateral shoulder shrug symmetric and strong XII: midline tongue extension without fassiculations Motor:  Normal  tone. 5/5 in upper and lower extremities bilaterally including strong and equal grip strength and dorsiflexion/plantar flexion Sensory: light touch normal in all extremities.  Cerebellar: normal finger-to-nose with bilateral upper extremities Gait: normal gait and balance CV: distal pulses palpable throughout   Skin: Skin is warm and dry. She is not diaphoretic.  Psychiatric: She has a normal mood and affect. Her behavior is normal.    ED Course  Procedures (including critical care time) Labs Review Labs Reviewed  BASIC METABOLIC PANEL - Abnormal; Notable for the following:    Potassium 3.2 (*)    Glucose, Bld 108 (*)    BUN 24 (*)    Creatinine, Ser 1.18 (*)    GFR calc non Af Amer 45 (*)    GFR calc Af Amer 52 (*)    All other components within normal limits  CBC - Abnormal; Notable  for the following:    RBC 5.14 (*)    Hemoglobin 15.3 (*)    All other components within normal limits  CBG MONITORING, ED - Abnormal; Notable for the following:    Glucose-Capillary 103 (*)    All other components within normal limits  URINALYSIS, ROUTINE W REFLEX MICROSCOPIC (NOT AT [facility])  BRAIN NATRIURETIC PEPTIDE  I-STAT TROPOININ, ED    Imaging Review Dg Chest 2 View  02/19/2016  CLINICAL DATA:  Headache. EXAM: CHEST  2 VIEW COMPARISON:  12/22/2014 FINDINGS: Patient is post median sternotomy. Cardiomediastinal contours are unchanged with tortuosity of the thoracic aorta. Improved cardiomegaly from prior. Hyperinflation and coarse interstitial markings are again seen. Scarring in the left midlung zone. No pulmonary edema, pleural effusion, or pneumothorax. There is degenerative change in the spine. IMPRESSION: 1. Chronic hyperinflation and coarse interstitial markings. Scarring in the left midlung zone. 2. Slight decrease in cardiomegaly from prior. Tortuous thoracic aorta is again seen, unchanged. Electronically Signed   By: Jeb Levering M.D.   On: 02/19/2016 20:53   Ct Head Wo Contrast  02/19/2016  CLINICAL DATA:  Right temporal headache x2 days EXAM: CT HEAD WITHOUT CONTRAST TECHNIQUE: Contiguous axial images were obtained from the base of the skull through the vertex without intravenous contrast. COMPARISON:  09/09/2015 FINDINGS: No evidence of parenchymal hemorrhage or extra-axial fluid collection. No mass lesion, mass effect, or midline shift. No CT evidence of acute infarction. Subcortical white matter and periventricular small vessel ischemic changes. Intracranial atherosclerosis. Cerebral volume is within normal limits.  No ventriculomegaly. The visualized paranasal sinuses are essentially clear. The mastoid air cells are unopacified. No evidence of calvarial fracture. IMPRESSION: No evidence of acute intracranial abnormality. Small vessel ischemic changes. Electronically Signed   By:  Julian Hy M.D.   On: 02/19/2016 20:52   I have personally reviewed and evaluated these images and lab results as part of my medical decision-making.   EKG Interpretation   Date/Time:  Friday February 19 2016 17:05:26 EDT Ventricular Rate:  69 PR Interval:    QRS Duration: 101 QT Interval:  408 QTC Calculation: 438 R Axis:   40 Text Interpretation:  Sinus rhythm Supraventricular bigeminy Probable left  atrial enlargement Abnormal T, consider ischemia, diffuse leads Since last  tracing T wave abnormality is new Confirmed by Eulis Foster  MD, Vira Agar [id number])  on 02/19/2016 7:45:18 PM Also confirmed by Eulis Foster  MD, ELLIOTT [phone]),  editor [place] CT, Leda Gauze [phone])  on 02/20/2016 10:11:48 AM      MDM   Final diagnoses:  Nonintractable headache, unspecified chronicity pattern, unspecified headache type  Pt is afebrile and non-toxic appearing in NAD. Blood pressure is elevated, vital signs otherwise stable. Pt is not orthostatic. No nuchal rigidity, changes in vision, TTP of temple, or focal neuro deficits. Low suspicion for temporal arteritis. CT head negative for acute intracranial process. Suspect benign etiology for headache. Tylenol given in ED. Pt complained of mild SOB and allergy like sxs - CXR negative for acute changes. EKG NSR with T wave abnormalities. Troponin negative. BNP normal. Creatinine mildly elevated; however, improved from previous. Slight decrease in potassium. CBC re-assuring. U/A negative for infection. Pt seen by cardiologist on 02/08/16, SOB suspected to be secondary to weight gain and deconditioning. For numbness in upper extremities b/l, she is scheduled to see orthopedics on 02/22/16. Pt discussed and also evaluated by Dr. Zenia Resides.   Encouraged pt to follow up with PCP regarding headaches. Symptomatic management for headaches and allergies discussed - tylenol and antihistamine. Return precautions discussed. Pt voiced understanding and is agreeable.       Roxanna Mew, PA-C 02/20/16 [place], [place] 02/20/16 [phone]

## 2016-04-14 NOTE — Progress Notes (Signed)
This encounter was created in error - please disregard.

## 2016-04-24 ENCOUNTER — Encounter (HOSPITAL_COMMUNITY): Payer: Self-pay | Admitting: Emergency Medicine

## 2016-04-24 ENCOUNTER — Emergency Department (HOSPITAL_COMMUNITY): Payer: Commercial Managed Care - HMO

## 2016-04-24 ENCOUNTER — Emergency Department (HOSPITAL_COMMUNITY)
Admission: EM | Admit: 2016-04-24 | Discharge: 2016-04-24 | Disposition: A | Discharge status: Home / Self Care | Payer: Commercial Managed Care - HMO | Attending: Emergency Medicine | Admitting: Emergency Medicine

## 2016-04-24 DIAGNOSIS — F17210 Nicotine dependence, cigarettes, uncomplicated: Secondary | ICD-10-CM | POA: Insufficient documentation

## 2016-04-24 DIAGNOSIS — Z955 Presence of coronary angioplasty implant and graft: Secondary | ICD-10-CM | POA: Diagnosis not present

## 2016-04-24 DIAGNOSIS — K222 Esophageal obstruction: Secondary | ICD-10-CM | POA: Insufficient documentation

## 2016-04-24 DIAGNOSIS — J45909 Unspecified asthma, uncomplicated: Secondary | ICD-10-CM | POA: Insufficient documentation

## 2016-04-24 DIAGNOSIS — Z7982 Long term (current) use of aspirin: Secondary | ICD-10-CM | POA: Diagnosis not present

## 2016-04-24 DIAGNOSIS — Z79899 Other long term (current) drug therapy: Secondary | ICD-10-CM | POA: Diagnosis not present

## 2016-04-24 DIAGNOSIS — R001 Bradycardia, unspecified: Secondary | ICD-10-CM | POA: Insufficient documentation

## 2016-04-24 DIAGNOSIS — I2510 Atherosclerotic heart disease of native coronary artery without angina pectoris: Secondary | ICD-10-CM | POA: Insufficient documentation

## 2016-04-24 DIAGNOSIS — I110 Hypertensive heart disease with heart failure: Secondary | ICD-10-CM | POA: Diagnosis not present

## 2016-04-24 DIAGNOSIS — R079 Chest pain, unspecified: Secondary | ICD-10-CM

## 2016-04-24 DIAGNOSIS — I5042 Chronic combined systolic (congestive) and diastolic (congestive) heart failure: Secondary | ICD-10-CM | POA: Insufficient documentation

## 2016-04-24 LAB — CBC
HCT: 42.7 % (ref 36.0–46.0)
Hemoglobin: 14.5 g/dL (ref 12.0–15.0)
MCH: 29.4 pg (ref 26.0–34.0)
MCHC: 34 g/dL (ref 30.0–36.0)
MCV: 86.6 fL (ref 78.0–100.0)
Platelets: 134 10*3/uL — ABNORMAL LOW (ref 150–400)
RBC: 4.93 MIL/uL (ref 3.87–5.11)
RDW: 14.9 % (ref 11.5–15.5)
WBC: 8.8 10*3/uL (ref 4.0–10.5)

## 2016-04-24 LAB — URINALYSIS, ROUTINE W REFLEX MICROSCOPIC
Bilirubin Urine: NEGATIVE
Glucose, UA: NEGATIVE mg/dL
Hgb urine dipstick: NEGATIVE
Ketones, ur: NEGATIVE mg/dL
Leukocytes, UA: NEGATIVE
Nitrite: NEGATIVE
Protein, ur: NEGATIVE mg/dL
Specific Gravity, Urine: 1.025 (ref 1.005–1.030)
pH: 5.5 (ref 5.0–8.0)

## 2016-04-24 LAB — BASIC METABOLIC PANEL
Anion gap: 7 (ref 5–15)
BUN: 17 mg/dL (ref 6–20)
CO2: 24 mmol/L (ref 22–32)
Calcium: 9.1 mg/dL (ref 8.9–10.3)
Chloride: 110 mmol/L (ref 101–111)
Creatinine, Ser: 1.26 mg/dL — ABNORMAL HIGH (ref 0.44–1.00)
GFR calc Af Amer: 48 mL/min — ABNORMAL LOW (ref >60)
GFR calc non Af Amer: 42 mL/min — ABNORMAL LOW (ref >60)
Glucose, Bld: 111 mg/dL — ABNORMAL HIGH (ref 65–99)
Potassium: 3.4 mmol/L — ABNORMAL LOW (ref 3.5–5.1)
Sodium: 141 mmol/L (ref 135–145)

## 2016-04-24 LAB — I-STAT TROPONIN, ED: Troponin i, poc: 0.01 ng/mL (ref 0.00–0.08)

## 2016-04-24 LAB — D-DIMER, QUANTITATIVE: D-Dimer, Quant: 0.49 ug/mL-FEU (ref 0.00–0.50)

## 2016-04-24 MED ORDER — FAMOTIDINE 20 MG PO TABS: 20.0000 mg | ORAL_TABLET | Freq: Two times a day (BID) | ORAL | 0 refills | Status: DC

## 2016-04-24 MED ORDER — CLOTRIMAZOLE 1 % EX CREA: TOPICAL_CREAM | CUTANEOUS | 0 refills | Status: DC

## 2016-04-24 NOTE — ED Provider Notes (Signed)
[facility] DEPT Provider Note   CSN: [id number] Arrival date & time: 04/24/16  1151     History   Chief Complaint Chief Complaint  Patient presents with  . Chest Pain    HPI [name] [name] is a 70 y.o. female.  Pt said that she has been having intermittent left sided cp and sob since she had a CABG 1 year ago.  She also said that she will occ get a rash under her left breast and is concerned that rash will cause cancer.  She does not have the rash now.  The pt also said that she has not taken any medications today.  She had been taking a bp pill, but it was too big and she had to break it in half.  The pill scratched her throat, so she has not taken that in a few days.  Pt said that she has an appt with her cardiologist tomorrow.  Pt also reports a recent mammogram of her left breast that was normal.      Past Medical History:  Diagnosis Date  . Acute on chronic combined systolic and diastolic HF (heart failure) ([place]) 10/30/2014   EF 35-40% with RWMA   . Arthritis   . Asthma   . Atrial fibrillation with RVR ([place])   . Cardiomyopathy, ischemic   . Hypertension   . Left main coronary artery disease 11/04/2014  . S/P CABG x 2 with clipping of LA appendage 11/14/2014   LIMA to LAD, SVG to OM, EVH via right thigh    Patient Active Problem List   Diagnosis Date Noted  . Pain in shoulder 02/08/2016  . Breast pain, left 02/08/2016  . Bilateral arm numbness and tingling while sleeping 02/08/2016  . Painful lumpy left breast 09/23/2015  . [place] intertrigo 02/11/2015  . S/P CABG x 2 with clipping of LA appendage 11/14/2014  . Accelerated hypertension   . Cardiac arrest ([place]) 11/04/2014  . Left main coronary artery disease 11/04/2014  . Cardiomyopathy, ischemic   . Coronary artery disease due to lipid rich plaque   . Acute respiratory failure with hypoxemia ([place])   . Essential hypertension   . Atrial fibrillation with RVR ([place])   . Cardiomyopathy ([place]) 10/30/2014  .  Hypokalemia 10/30/2014  . Chronic combined systolic and diastolic CHF (congestive heart failure) ([place]) 10/30/2014  . NSTEMI (non-ST elevated myocardial infarction) ([place])   . SOB (shortness of breath)   . Atrial fibrillation ([place]) 10/29/2014  . CAP (community acquired pneumonia) 10/29/2014  . HTN (hypertension) 09/16/2011    Past Surgical History:  Procedure Laterality Date  . CARDIOVERSION N/A 11/18/2014   Procedure: CARDIOVERSION;  Surgeon: [name], MD;  Location: [place];  Service: Cardiovascular;  Laterality: N/A;  . CLIPPING OF ATRIAL APPENDAGE N/A 11/14/2014   Procedure: CLIPPING OF ATRIAL APPENDAGE;  Surgeon: [name], MD;  Location: [place];  Service: Open Heart Surgery;  Laterality: N/A;  . CORONARY ARTERY BYPASS GRAFT N/A 11/14/2014   Procedure: CORONARY ARTERY BYPASS GRAFTING (CABG)TIMES 2 USING LEFT INTERNAL MAMMARY ARTERY AND RIGHT SAPHENOUS VEIN HARVESTED ENDOSCOPICALLY;  Surgeon: [name], MD;  Location: [place];  Service: Open Heart Surgery;  Laterality: N/A;  . LEFT HEART CATHETERIZATION WITH CORONARY ANGIOGRAM N/A 11/04/2014   Procedure: LEFT HEART CATHETERIZATION WITH CORONARY ANGIOGRAM;  Surgeon: Troy Sine, MD;  Location: [facility] CATH LAB;  Service: Cardiovascular;  Laterality: N/A;  . TEE WITHOUT CARDIOVERSION N/A 11/14/2014   Procedure: TRANSESOPHAGEAL ECHOCARDIOGRAM (TEE);  Surgeon: [name], MD;  Location: [place];  Service: Open Heart Surgery;  Laterality: N/A;  . TEMPORARY PACEMAKER INSERTION  11/04/2014   Procedure: TEMPORARY PACEMAKER INSERTION;  Surgeon: Troy Sine, MD;  Location[facility] CATH LAB;  Service: Cardiovascular;;    OB History    No data available       Home Medications    Prior to Admission medications   Medication Sig Start Date End Date Taking? Authorizing Provider  acetaminophen (TYLENOL) 500 MG tablet Take 500 mg by mouth every 6 (six) hours as needed for moderate pain.    Yes Historical Provider, MD  aspirin EC 81 MG EC  tablet Take 1 tablet (81 mg total) by mouth daily. 11/23/14  Yes Donielle Liston Alba, PA-C  atorvastatin (LIPITOR) 10 MG tablet Take 1 tablet (10 mg total) by mouth daily at 6 PM. 02/13/15  Yes [name], MD  carvedilol (COREG) 6.25 MG tablet Take 1 tablet (6.25 mg total) by mouth 2 (two) times daily. 08/28/15  Yes Rhonda G Barrett, PA-C  glycerin adult (GLYCERIN ADULT) 2 G SUPP Place 1 suppository rectally daily as needed for moderate constipation.   Yes Historical Provider, MD  losartan (COZAAR) 100 MG tablet Take 100 mg by mouth daily.   Yes Historical Provider, MD  triamterene-hydrochlorothiazide (DYAZIDE) 37.5-25 MG capsule Take 1 capsule by mouth daily. 08/12/15  Yes Historical Provider, MD  [name] 40 MG tablet Take 20 mg by mouth daily as needed (gout flare up).  01/08/16  Yes Historical Provider, MD  clotrimazole (LOTRIMIN) 1 % cream Apply to affected area 2 times daily 04/24/16   Isla Pence, MD  famotidine (PEPCID) 20 MG tablet Take 1 tablet (20 mg total) by mouth 2 (two) times daily. 04/24/16   Isla Pence, MD    Family History Family History  Problem Relation Age of Onset  . Cancer Mother   . Heart disease Father     Social History Social History  Substance Use Topics  . Smoking status: Current Some Day Smoker    Packs/day: 1.00    Years: 32.00    Types: Cigarettes  . Smokeless tobacco: Never Used     Comment: occasional puff here and there - 02/09/15  . Alcohol use No     Allergies   Bee venom; Codeine; and Shrimp [shellfish allergy]   Review of Systems Review of Systems  Respiratory: Positive for shortness of breath.   Cardiovascular: Positive for chest pain.  All other systems reviewed and are negative.    Physical Exam Updated Vital Signs BP (!) 147/102   Pulse (!) 59   Temp 97.7 F (36.5 C) (Oral)   Resp 15   Ht 5\' 7"  (1.702 m)   Wt 220 lb (99.8 kg)   SpO2 99%   BMI 34.46 kg/m   Physical Exam  Constitutional: She is oriented to person,  place, and time. She appears well-developed and well-nourished.  HENT:  Head: Normocephalic and atraumatic.  Right Ear: External ear normal.  Left Ear: External ear normal.  Nose: Nose normal.  Mouth/Throat: Oropharynx is clear and moist.  Eyes: Conjunctivae and EOM are normal. Pupils are equal, round, and reactive to light.  Neck: Normal range of motion. Neck supple.  Cardiovascular: Regular rhythm, normal heart sounds and intact distal pulses.  Bradycardia present.   Pulmonary/Chest: Effort normal and breath sounds normal.  Abdominal: Soft. Bowel sounds are normal.  Musculoskeletal: Normal range of motion.  Neurological: She is alert and oriented  to person, place, and time.  Skin: Skin is warm and dry.  Psychiatric: She has a normal mood and affect. Her behavior is normal. Judgment and thought content normal.  Nursing note and vitals reviewed.    ED Treatments / Results  Labs (all labs ordered are listed, but only abnormal results are displayed) Labs Reviewed  BASIC METABOLIC PANEL - Abnormal; Notable for the following:       Result Value   Potassium 3.4 (*)    Glucose, Bld 111 (*)    Creatinine, Ser 1.26 (*)    GFR calc non Af Amer 42 (*)    GFR calc Af Amer 48 (*)    All other components within normal limits  CBC - Abnormal; Notable for the following:    Platelets 134 (*)    All other components within normal limits  URINALYSIS, ROUTINE W REFLEX MICROSCOPIC (NOT AT [facility])  D-DIMER, QUANTITATIVE (NOT AT [facility])  I-STAT TROPOININ, ED    EKG  EKG Interpretation  Date/Time:  Sunday April 24 2016 12:00:38 EDT Ventricular Rate:  52 PR Interval:    QRS Duration: 99 QT Interval:  432 QTC Calculation: 402 R Axis:   11 Text Interpretation:  Sinus rhythm Supraventricular bigeminy LVH with secondary repolarization abnormality Confirmed by Gilford Raid MD, Takela Varden ([id number]) on 04/24/2016 12:32:03 PM       Radiology Dg Chest 2 View  Result Date: 04/24/2016 CLINICAL DATA:   Left-sided chest pain EXAM: CHEST  2 VIEW COMPARISON:  February 19, 2016 FINDINGS: The heart, hila, mediastinum, lungs, and pleura are unchanged no acute abnormalities. IMPRESSION: No active cardiopulmonary disease. Electronically Signed   By: Dorise Bullion III M.D   On: 04/24/2016 13:14    Procedures Procedures (including critical care time)  Medications Ordered in ED Medications - No data to display   Initial Impression / Assessment and Plan / ED Course  I have reviewed the triage vital signs and the nursing notes.  Pertinent labs & imaging results that were available during my care of the patient were reviewed by me and considered in my medical decision making (see chart for details).  Clinical Course   Pt's cp does not appear to be cardiac in nature.  It has been going on for a year.  Pt's main concern now seems to be that pills and food get stuck in her esophagus.  I will start pt on pepcid and give her the number to GI for an endoscopy.  She is also given a rx for clotrimazole for intertrigo that she gets periodically.  Pt has an appt tomorrow with her cardiologist and is encouraged to go over her medications and the correct way to take them with her doctor.  She knows to return if worse.  Final Clinical Impressions(s) / ED Diagnoses   Final diagnoses:  Chest pain, unspecified chest pain type  Esophageal stricture    New Prescriptions New Prescriptions   CLOTRIMAZOLE (LOTRIMIN) 1 % CREAM    Apply to affected area 2 times daily   FAMOTIDINE (PEPCID) 20 MG TABLET    Take 1 tablet (20 mg total) by mouth 2 (two) times daily.     Isla Pence, MD 04/24/16 [phone]

## 2016-04-24 NOTE — ED Triage Notes (Signed)
Patient states that since yesterday she having intermittent left sided chest pains that shoot. Patient states that she had open heart surgery last year and had SOB ever since.

## 2016-05-19 ENCOUNTER — Ambulatory Visit (INDEPENDENT_AMBULATORY_CARE_PROVIDER_SITE_OTHER): Payer: Commercial Managed Care - HMO | Admitting: Internal Medicine

## 2016-05-19 ENCOUNTER — Encounter: Payer: Self-pay | Admitting: Internal Medicine

## 2016-05-19 VITALS — BP 133/84 | HR 66 | Ht 67.5 in | Wt 217.6 lb

## 2016-05-19 DIAGNOSIS — I10 Essential (primary) hypertension: Secondary | ICD-10-CM

## 2016-05-19 DIAGNOSIS — I255 Ischemic cardiomyopathy: Secondary | ICD-10-CM | POA: Diagnosis not present

## 2016-05-19 DIAGNOSIS — I480 Paroxysmal atrial fibrillation: Secondary | ICD-10-CM

## 2016-05-19 DIAGNOSIS — Z951 Presence of aortocoronary bypass graft: Secondary | ICD-10-CM | POA: Diagnosis not present

## 2016-05-19 NOTE — Progress Notes (Signed)
OFFICE NOTE  Chief Complaint:  Follow-up  Primary Care Physician: [name], MD  HPI:  [name] [name] is a pleasant 70 year old female who I recently saw the hospital when she was in the cardiac care unit. She has a long standing history of Hypertension and tobacco abuse. She was admitted to [facility] 10/29/2014 with a several week history of worsening symptoms of shortness of breath, chest tightness, fatigue, and URI symptoms. She was initially evaluated by her PCP at which time she was noted to be in Atrial Fibrillation with RVR. She was sent to the emergency department for evaluation. EKG confirmed Atrial Fibrillation, Troponin was negative. CXR was obtained and was concerning for pneumonia and she was started on empiric ABX. She also underwent TTE which showed a reduced EF of 35-40%. She had diffuse global hypokinesis especially of the inferior wall. It was felt the patient should undergo cardiac catheterization for further diagnostics, however patient became anxious and would not consent to the procedure. The patient calmed down and in a few days and further consultation she was agreeable to proceed with cardiac catheterization. This was to be done on 11/03/2014 however, the prior to the procedure the patient became agitated, combativeness and shortness of breath. She progressed to full cardiac arrest with asystole without any preceding signs of symptoms. She was successful resuscitated but showed signs of decerebrate posturing and was placed on the cooling protocol. During cooling she continued to have Atrial Fibrillation which progressed to bradycardia. It was again decided she should undergo cardiac catheterization. This was completed on 11/04/2014 and revealed significant coronary artery disease. It was felt coronary bypass grafting would be the treatment of choice and TCTS was consulted. She was evaluated by Dr. Roxy Manns who agreed at some point she would benefit from  Coronary Bypass grafting procedure. However at the time of evaluation the patient remained on a ventilator and was sedated without signs of neurologic recovery. It was felt medical therapy should be continued at this time and if patient awakes and returns to baseline surgery could be reconsidered at that point.The patient was treated medically for several days. She was successfully weaned and later extubated. She continued to have issues with Atrial Fibrillation. She was again evaluated by Dr. Roxy Manns on 11/11/2014 at which time he felt she was medically stable to proceed with Coronary Bypass grafting procedure. The risks and benefits of the procedure were explained to the patient and she was agreeable to proceed. She was taken to the operating room on 11/14/2014. She underwent CABG x 2 utilizing LIMA to LAD, SVG to OM. She also underwent clipping of her Left Atrial Appendage and endoscopic saphenous vein harvest from her right thigh. She tolerated the procedure well and was taken to the SICU in stable condition. The patient was extubated the evening of surgery. During her stay in the SICU she developed Rapid Atrial Fibrillation. She was treated with Amiodarone and Lopressor, but ultimately did not convert to NSR. EP consult was requested and it was felt cardioversion could be attempted. This was completed on POD # 4 and was unsuccessful. However, the patient ultimately converted to NSR on her own. She remains on Lopressor, which has been titrated as tolerated. She was also placed on Eliquis for stroke prevention.  She returns today for follow-up and is feeling quite well. She denies any chest pain or worsening shortness of breath. She is interesting in getting off of amiodarone completely. She seems to be maintaining sinus rhythm at a  rate of 55 with occasional PVCs.  [name] [name] returns today for follow-up. She denies any chest pain but is reporting some tenderness and heaviness in the left  breast. She feels that it somewhat enlarged and feels heavier than the right side. It was before thought that her pain in the left chest may be related to her sternotomy however her symptoms are worsening. She denies any nipple discharge or skin changes in that area. She had been struggling with a Candida intertrigo which was successfully treated with topical medications. In addition she has some chronic back pain which I think is unrelated. Recently she saw a nurse practitioner and was placed on carvedilol as an alternative to her metoprolol. She seems to be tolerating this fairly well. Heart rate is now higher than it had been previously.  02/08/2016  [name] [name] returns today for follow-up. At her last office visit she was complaining of some breast heaviness and pain and I ordered a mammogram which was negative for any malignancy or masses. She appears to have had resolution of her Candida intertrigo and I've indicated that she could use baby powder or a dry antiperspirant to help with sweating and itching. She continues to have some complaints of numbness and tingling the go down both arms which could be arising from the shoulders or neck. I will refer to orthopedics for this. In addition she reports some shortness of breath and weight gain which she thinks is related to medicines however it seems to be more related to appetite and decreased exercise. She is also back off on her smoking some but has not yet been able to quit.  05/19/2016  [name] [name] is seen today in follow-up. She has a number of unusual complaints today. Most recently however she was placed on a number of medications for blood pressure from her primary care provider which were different for the medications we have listed for her. She noted some side effects from these and says that she stopped taking them and resume taking the medications that she is currently on. Of note she has lost 7 pounds since her last office visit. She denies any  worsening shortness of breath or chest pain. She is due for repeat echo for repeat assessment of her cardiomyopathy.  PMHx:  Past Medical History:  Diagnosis Date  . Acute on chronic combined systolic and diastolic HF (heart failure) ([place]) 10/30/2014   EF 35-40% with RWMA   . Arthritis   . Asthma   . Atrial fibrillation with RVR ([place])   . Cardiomyopathy, ischemic   . Hypertension   . Left main coronary artery disease 11/04/2014  . S/P CABG x 2 with clipping of LA appendage 11/14/2014   LIMA to LAD, SVG to OM, EVH via right thigh    Past Surgical History:  Procedure Laterality Date  . CARDIOVERSION N/A 11/18/2014   Procedure: CARDIOVERSION;  Surgeon: [name], MD;  Location: [place];  Service: Cardiovascular;  Laterality: N/A;  . CLIPPING OF ATRIAL APPENDAGE N/A 11/14/2014   Procedure: CLIPPING OF ATRIAL APPENDAGE;  Surgeon: [name], MD;  Location: [place];  Service: Open Heart Surgery;  Laterality: N/A;  . CORONARY ARTERY BYPASS GRAFT N/A 11/14/2014   Procedure: CORONARY ARTERY BYPASS GRAFTING (CABG)TIMES 2 USING LEFT INTERNAL MAMMARY ARTERY AND RIGHT SAPHENOUS VEIN HARVESTED ENDOSCOPICALLY;  Surgeon: [name], MD;  Location: [place];  Service: Open Heart Surgery;  Laterality: N/A;  . LEFT HEART CATHETERIZATION WITH CORONARY ANGIOGRAM N/A 11/04/2014  Procedure: LEFT HEART CATHETERIZATION WITH CORONARY ANGIOGRAM;  Surgeon: Troy Sine, MD;  Location: [facility] CATH LAB;  Service: Cardiovascular;  Laterality: N/A;  . TEE WITHOUT CARDIOVERSION N/A 11/14/2014   Procedure: TRANSESOPHAGEAL ECHOCARDIOGRAM (TEE);  Surgeon: [name], MD;  Location: [place];  Service: Open Heart Surgery;  Laterality: N/A;  . TEMPORARY PACEMAKER INSERTION  11/04/2014   Procedure: TEMPORARY PACEMAKER INSERTION;  Surgeon: Troy Sine, MD;  Location: [facility] CATH LAB;  Service: Cardiovascular;;    FAMHx:  Family History  Problem Relation Age of Onset  . Cancer Mother   . Heart disease Father     SOCHx:    reports that she has been smoking Cigarettes.  She has a 32.00 pack-year smoking history. She has never used smokeless tobacco. She reports that she does not drink alcohol or use drugs.  ALLERGIES:  Allergies  Allergen Reactions  . Bee Venom Anaphylaxis  . Codeine Nausea And Vomiting  . Shrimp [Shellfish Allergy] Swelling    ROS: Pertinent items noted in HPI and remainder of comprehensive ROS otherwise negative.  HOME MEDS: Current Outpatient Prescriptions  Medication Sig Dispense Refill  . acetaminophen (TYLENOL) 500 MG tablet Take 500 mg by mouth every 6 (six) hours as needed for moderate pain.     Marland Kitchen aspirin EC 81 MG EC tablet Take 1 tablet (81 mg total) by mouth daily.    Marland Kitchen atorvastatin (LIPITOR) 10 MG tablet Take 1 tablet (10 mg total) by mouth daily at 6 PM. 90 tablet 3  . carvedilol (COREG) 6.25 MG tablet Take 1 tablet (6.25 mg total) by mouth 2 (two) times daily. 60 tablet 6  . clotrimazole (LOTRIMIN) 1 % cream Apply to affected area 2 times daily 15 g 0  . famotidine (PEPCID) 20 MG tablet Take 1 tablet (20 mg total) by mouth 2 (two) times daily. 30 tablet 0  . glycerin adult (GLYCERIN ADULT) 2 G SUPP Place 1 suppository rectally daily as needed for moderate constipation.    Marland Kitchen losartan (COZAAR) 100 MG tablet Take 100 mg by mouth daily.    Marland Kitchen triamterene-hydrochlorothiazide (DYAZIDE) 37.5-25 MG capsule Take 1 capsule by mouth daily.    Marland Kitchen [name] 40 MG tablet Take 20 mg by mouth daily as needed (gout flare up).      No current facility-administered medications for this visit.     LABS/IMAGING: No results found for this or any previous visit (from the past 48 hour(s)). No results found.  WEIGHTS: Wt Readings from Last 3 Encounters:  05/19/16 217 lb 9.6 oz (98.7 kg)  04/24/16 220 lb (99.8 kg)  02/08/16 224 lb (101.6 kg)    VITALS: BP 133/84   Pulse 66   Ht 5' 7.5" (1.715 m)   Wt 217 lb 9.6 oz (98.7 kg)   BMI 33.58 kg/m   EXAM: General appearance: alert and no  distress Neck: no carotid bruit, no JVD and thyroid not enlarged, symmetric, no tenderness/mass/nodules Lungs: clear to auscultation bilaterally Heart: regular rate and rhythm, S1, S2 normal, no murmur, click, rub or gallop Abdomen: soft, non-tender; bowel sounds normal; no masses,  no organomegaly Extremities: extremities normal, atraumatic, no cyanosis or edema Pulses: 2+ and symmetric Skin: Skin color, texture, turgor normal. No rashes or lesions Neurologic: Grossly normal Breasts: The left breast is slightly larger than the right breast with some lumpy tissue but no clear mass, no skin changes or nipple discharge noted  EKG: Deferred  ASSESSMENT: 1. Coronary artery disease status post CABG 2  with LIMA to LAD and SVG to OM 2. Acute on chronic combined systolic congestive heart failure, EF 35-40% 3. Paroxysmal atrial fibrillation-CHADSVASC 4, - status post left atrial closure (not on anticoagulation) 4. Dyslipidemia  PLAN: 1.   [name] [name] has had a couple of emergency room visits for non cardiac issues. Blood pressure appears well-controlled today. We'll continue her current regimen with carvedilol,  Aspirin and atorvastatin. I have discarded amlodipine, valsartan/HCTZ and another medication which was provided by her primary care provider. She is due for repeat echocardiogram which we'll order today. Follow-up with me in 6 months.  [name], MD, [name] Attending Cardiologist [place] 05/19/2016, 4:50 PM

## 2016-05-19 NOTE — Patient Instructions (Addendum)
Medication Instructions:  Your physician recommends that you continue on your current medications as directed. Please refer to the Current Medication list given to you today.  Labwork: NONE  Testing/Procedures: Your physician has requested that you have an echocardiogram. Echocardiography is a painless test that uses sound waves to create images of your heart. It provides your doctor with information about the size and shape of your heart and how well your heart's chambers and valves are working. This procedure takes approximately one hour. There are no restrictions for this procedure.  Follow-Up: Your physician wants you to follow-up in: 6 MONTHS WITH DR HILTY. You will receive a reminder letter in the mail two months in advance. If you don't receive a letter, please call our office to schedule the follow-up appointment.   Any Other Special Instructions Will Be Listed Below (If Applicable).     If you need a refill on your cardiac medications before your next appointment, please call your pharmacy.  

## 2016-06-02 ENCOUNTER — Other Ambulatory Visit: Payer: Self-pay

## 2016-06-02 ENCOUNTER — Ambulatory Visit (HOSPITAL_COMMUNITY): Payer: Commercial Managed Care - HMO | Attending: Cardiovascular Disease

## 2016-06-02 DIAGNOSIS — I351 Nonrheumatic aortic (valve) insufficiency: Secondary | ICD-10-CM | POA: Diagnosis not present

## 2016-06-02 DIAGNOSIS — I4891 Unspecified atrial fibrillation: Secondary | ICD-10-CM | POA: Diagnosis not present

## 2016-06-02 DIAGNOSIS — I110 Hypertensive heart disease with heart failure: Secondary | ICD-10-CM | POA: Insufficient documentation

## 2016-06-02 DIAGNOSIS — I255 Ischemic cardiomyopathy: Secondary | ICD-10-CM | POA: Diagnosis not present

## 2016-06-02 DIAGNOSIS — I340 Nonrheumatic mitral (valve) insufficiency: Secondary | ICD-10-CM | POA: Diagnosis not present

## 2016-06-02 DIAGNOSIS — I509 Heart failure, unspecified: Secondary | ICD-10-CM | POA: Insufficient documentation

## 2016-06-02 DIAGNOSIS — I371 Nonrheumatic pulmonary valve insufficiency: Secondary | ICD-10-CM | POA: Insufficient documentation

## 2016-06-02 DIAGNOSIS — Z720 Tobacco use: Secondary | ICD-10-CM | POA: Insufficient documentation

## 2016-06-02 DIAGNOSIS — I071 Rheumatic tricuspid insufficiency: Secondary | ICD-10-CM | POA: Insufficient documentation

## 2016-06-02 DIAGNOSIS — R0600 Dyspnea, unspecified: Secondary | ICD-10-CM | POA: Diagnosis not present

## 2016-06-03 ENCOUNTER — Other Ambulatory Visit: Payer: Self-pay | Admitting: Internal Medicine

## 2016-06-14 IMAGING — CR DG CHEST 1V PORT
1 series · 1 of 1 positions shown · non-contrast
Comparison: Portable exam 9199 hours compared to 10/09/2014

CLINICAL DATA: Acute chest pain at anterior RIGHT chest, soreness
in shortness of breath, symptoms for a few days, history smoking,
hypertension, asthma

EXAM:
PORTABLE CHEST - 1 VIEW

[AP]
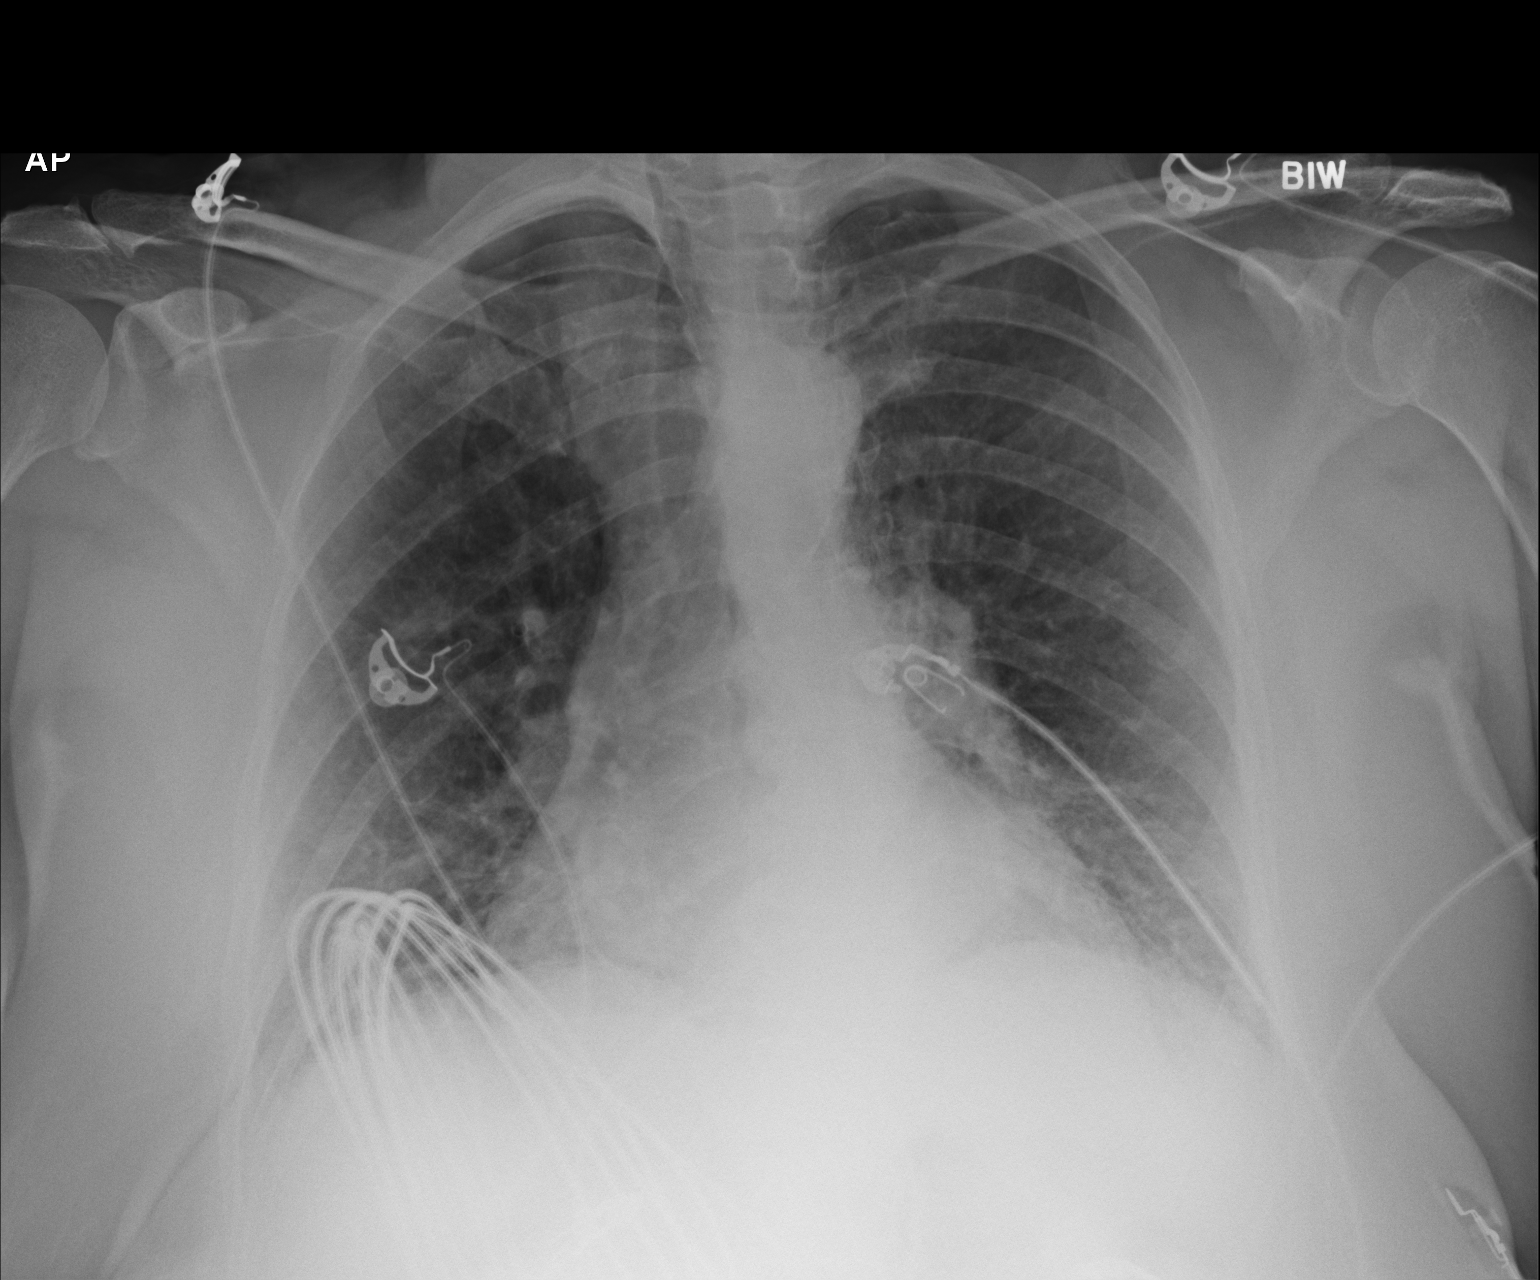

[1 of 1 positions shown; findings below may reference images not displayed]

FINDINGS: Enlargement of cardiac silhouette.

Atherosclerotic calcification aorta.

Mediastinal contours and pulmonary vascularity normal.

Bronchitic changes with bibasilar atelectasis.

Cannot completely exclude LEFT basilar infiltrate.

Upper lungs clear.

No gross pleural effusion or pneumothorax.
IMPRESSION: Bronchitic changes with bibasilar atelectasis, cannot completely
exclude LEFT basilar infiltrate.

## 2016-06-15 IMAGING — CR DG CHEST 2V
2 series · 2 of 2 positions shown · non-contrast
Comparison: October 29, 2014.

CLINICAL DATA: Shortness of breath.

EXAM:
CHEST  2 VIEW

[chest pa]
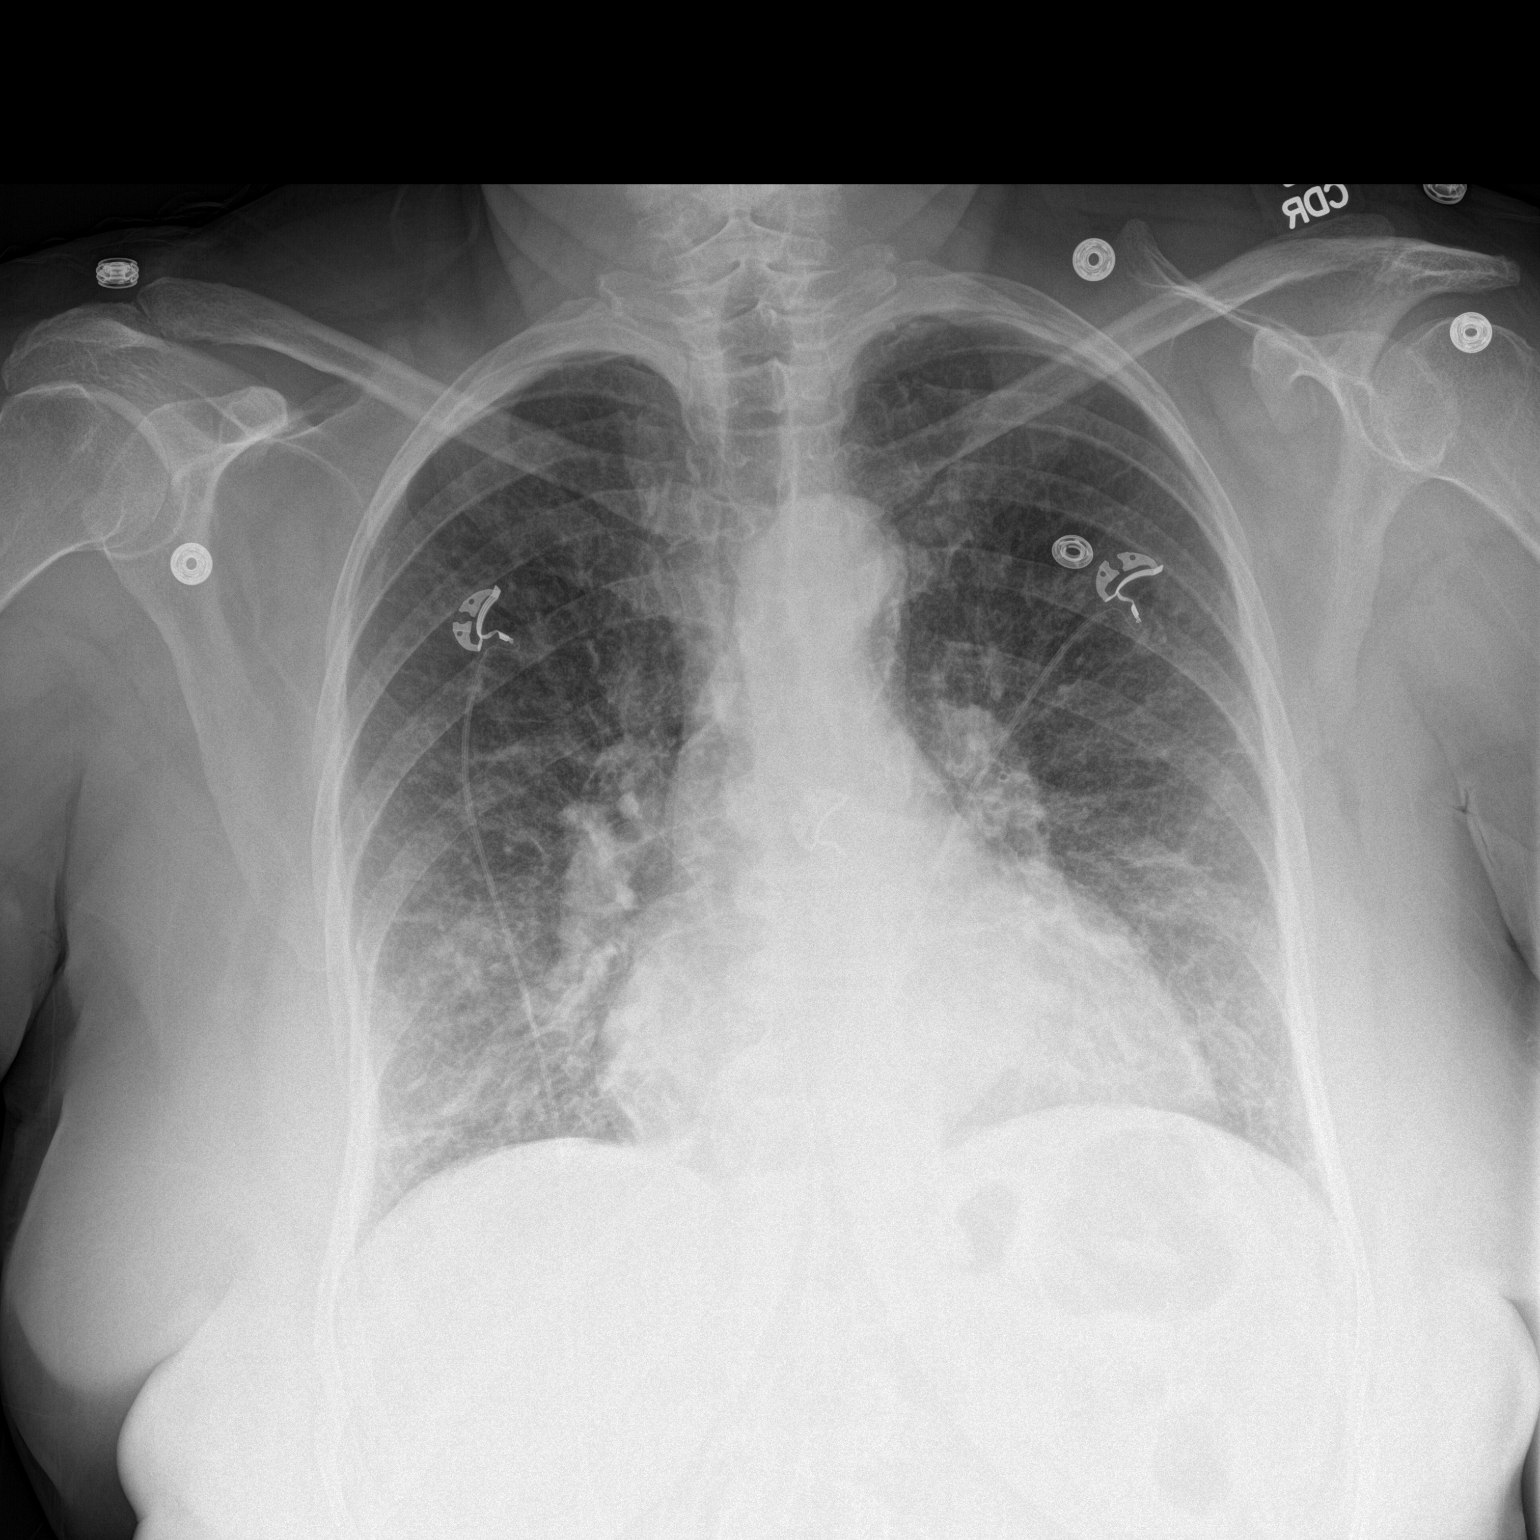

[chest lat]
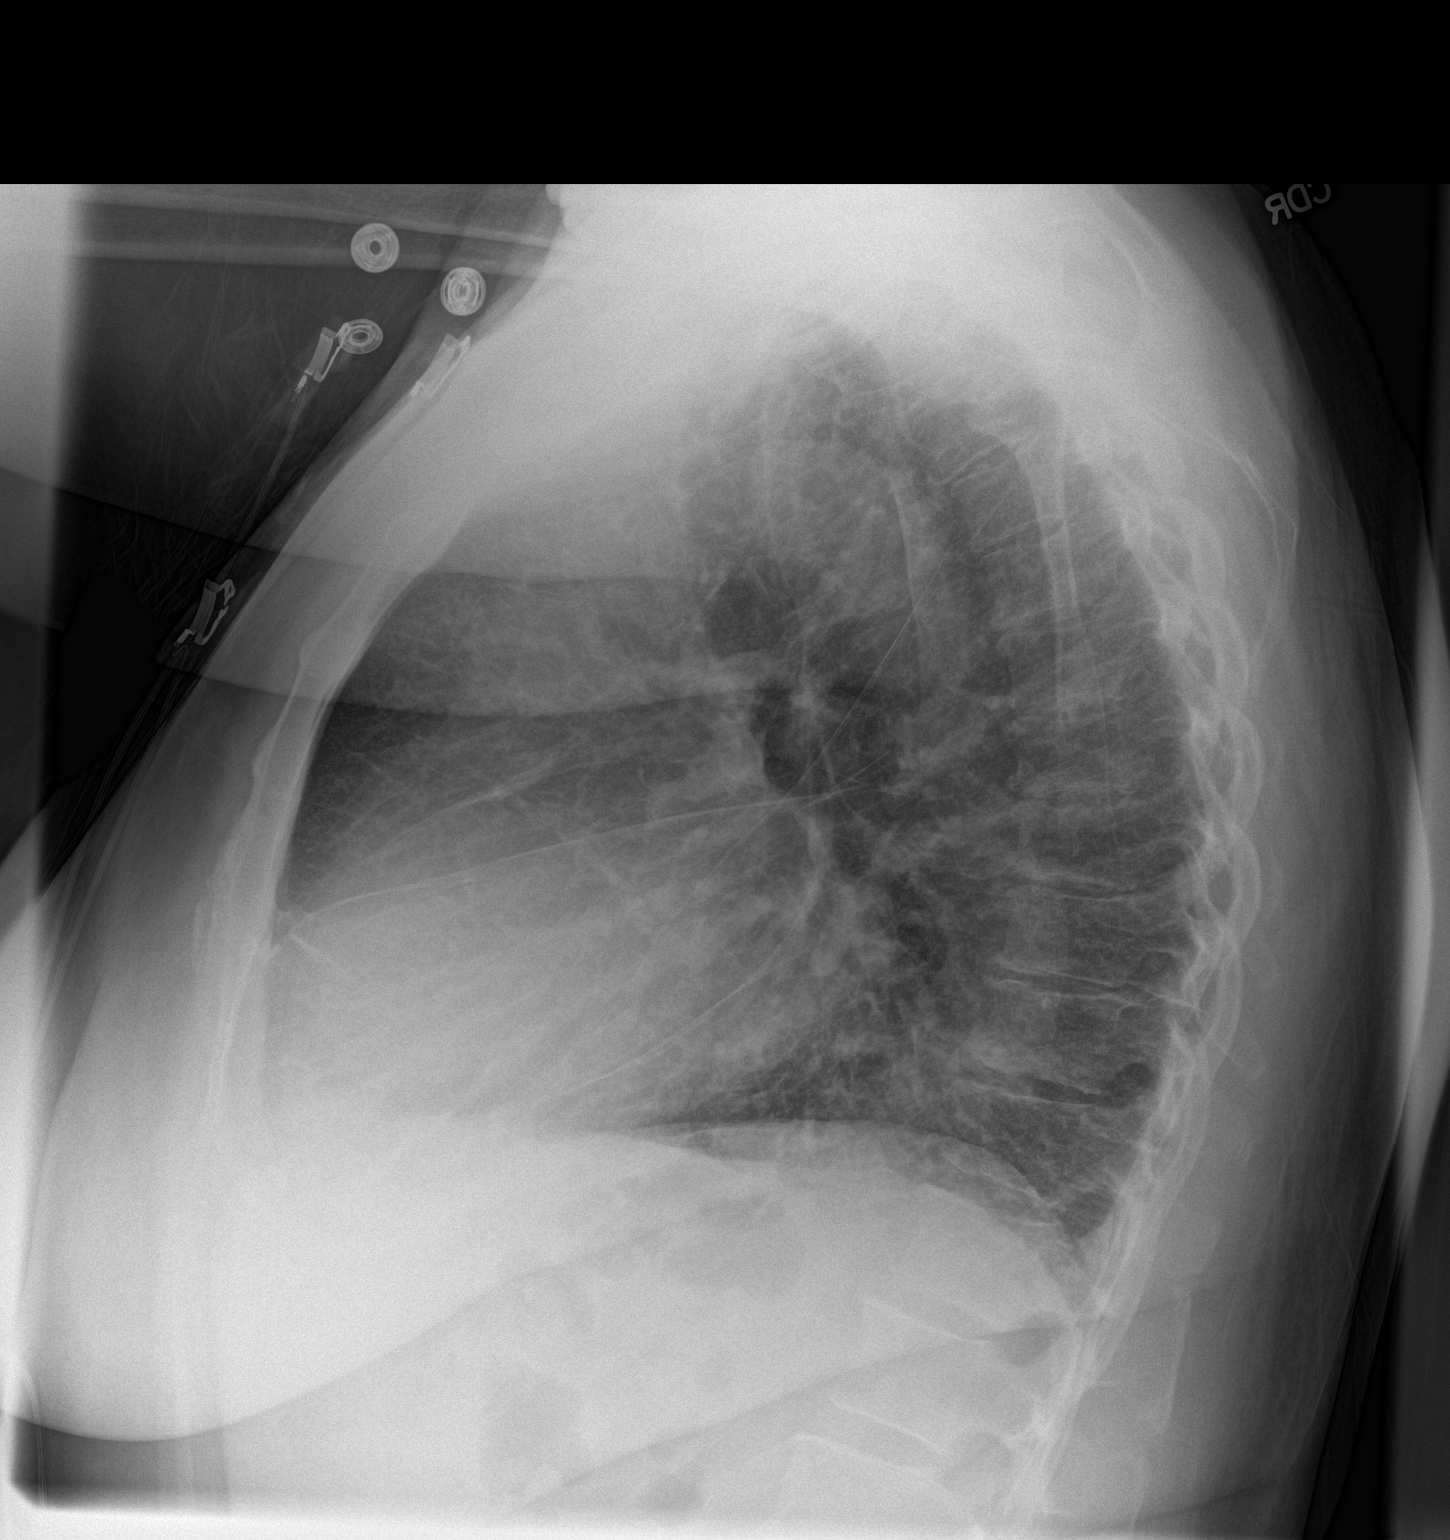

[2 of 2 positions shown; findings below may reference images not displayed]

FINDINGS: Stable cardiomediastinal silhouette. No pneumothorax or pleural
effusion is noted. Slightly increased interstitial densities are
noted in both lower lobes concerning for pulmonary edema. Bony
thorax appears to be intact.
IMPRESSION: Slightly increased bilateral lower lobe interstitial densities are
noted concerning for pulmonary edema.

## 2016-06-18 IMAGING — CR DG CHEST 1V PORT
1 series · 1 of 1 positions shown · non-contrast
Comparison: 10/30/2014

CLINICAL DATA: Dyspnea

EXAM:
PORTABLE CHEST - 1 VIEW

[AP]
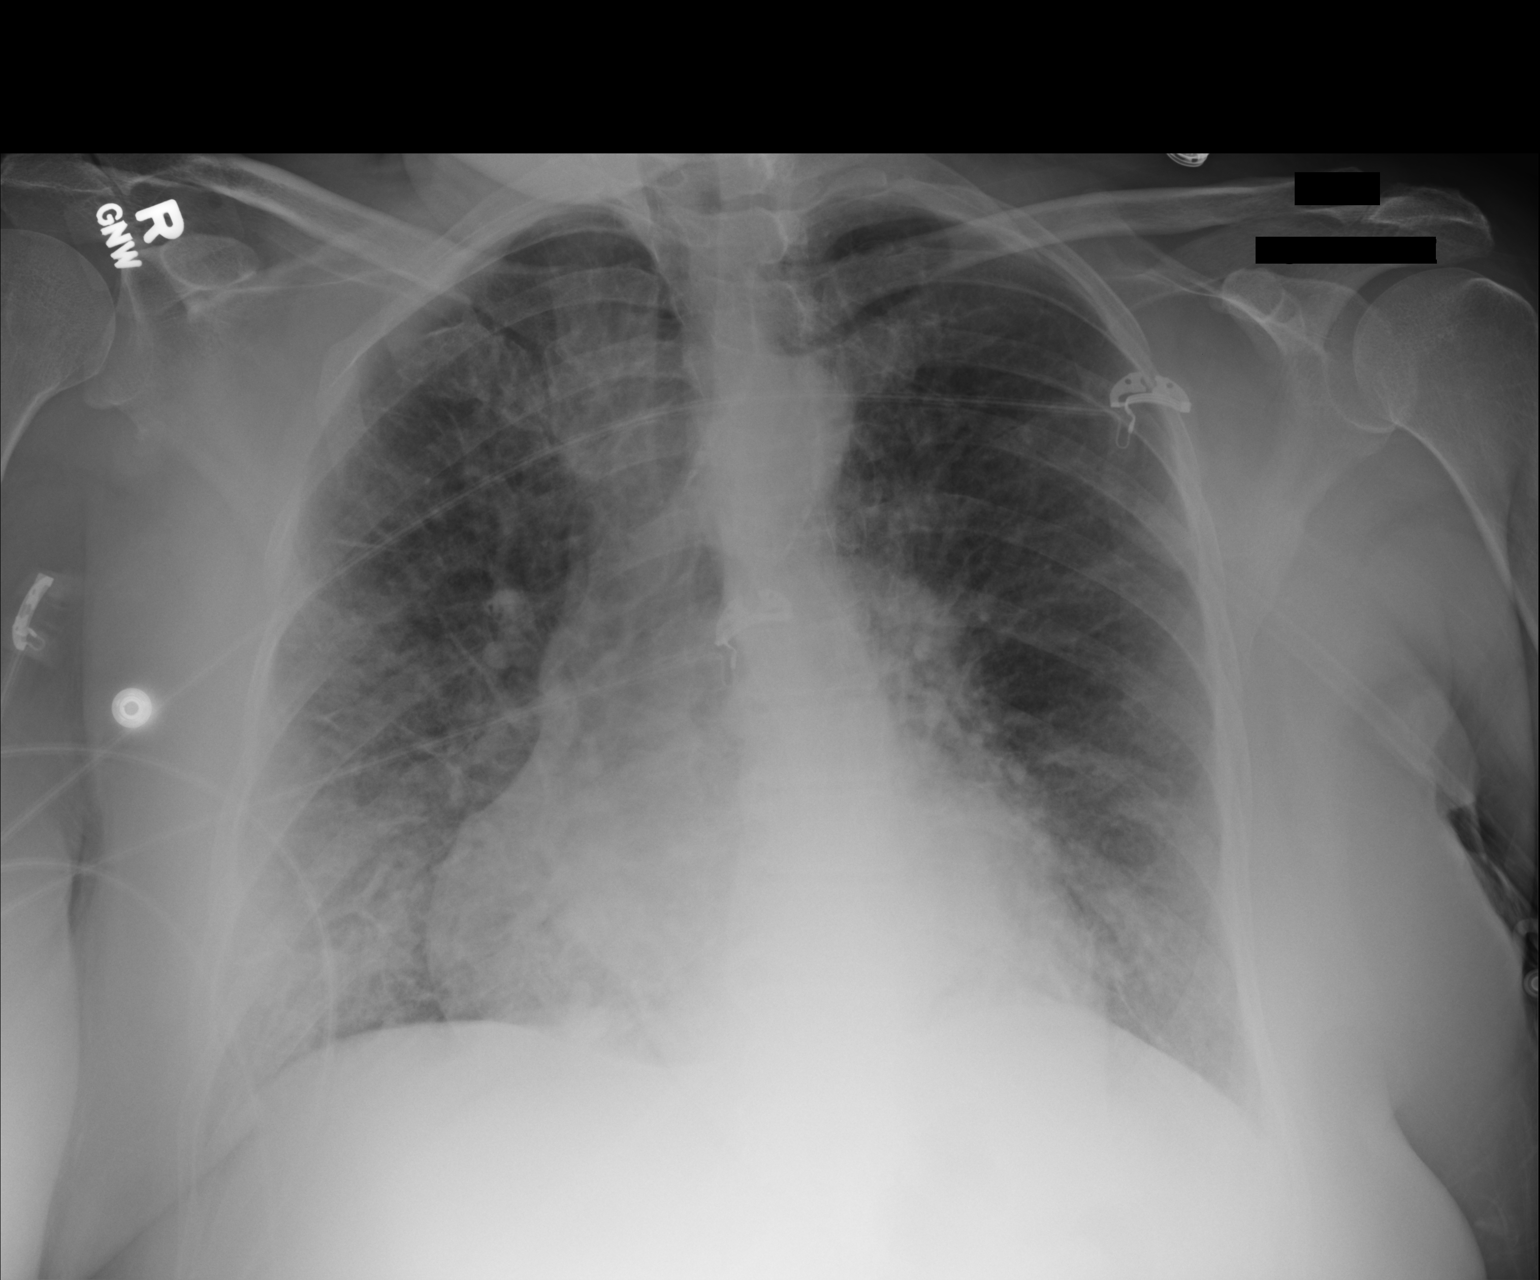

[1 of 1 positions shown; findings below may reference images not displayed]

FINDINGS: There is cardiomegaly and aortic tortuosity, unchanged. There is
moderate vascular and interstitial prominence, unchanged. There is
no confluent airspace consolidation. There is no large effusion.
IMPRESSION: Unchanged vascular and interstitial prominence, likely mild
congestive heart failure.

## 2016-06-19 IMAGING — CR DG ABD PORTABLE 1V
1 series · 1 of 1 positions shown · non-contrast
Comparison: CT of the abdomen and pelvis performed 02/12/2009

CLINICAL DATA: Nasogastric tube placement.  Initial encounter.

EXAM:
PORTABLE ABDOMEN - 1 VIEW

[AP]
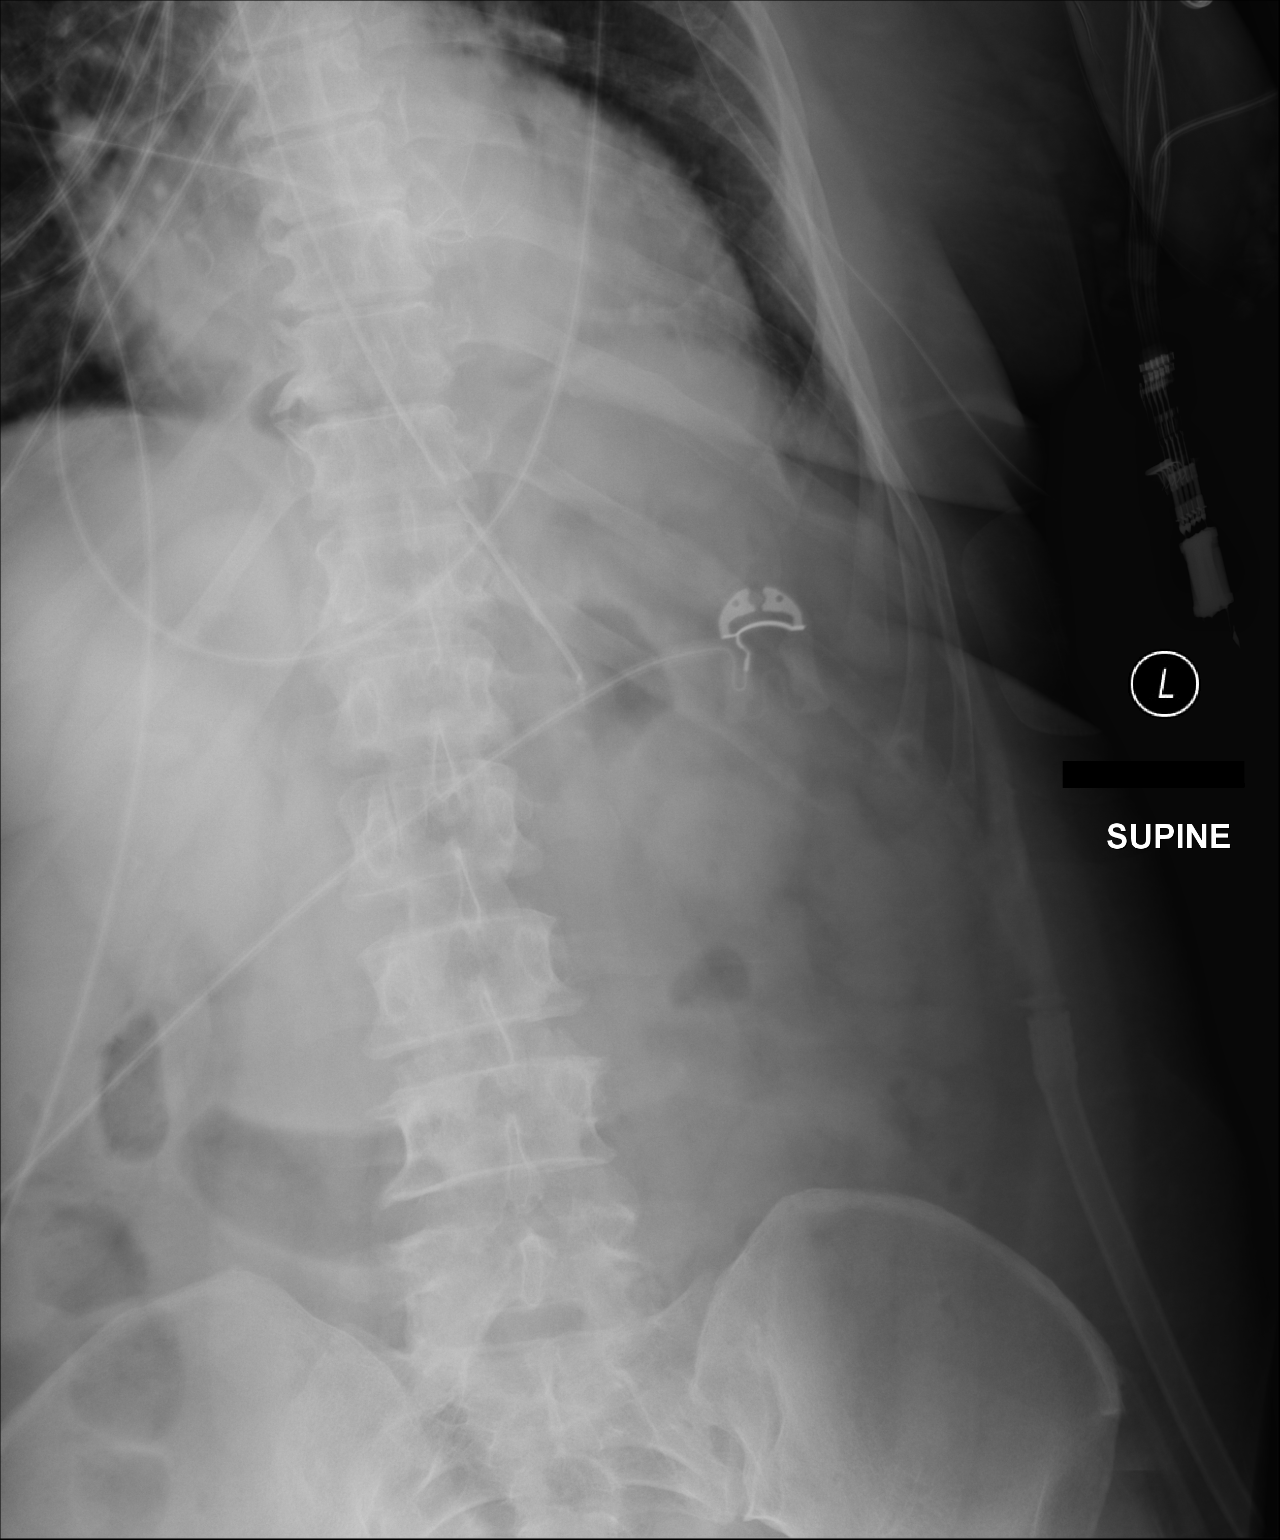

[1 of 1 positions shown; findings below may reference images not displayed]

FINDINGS: [name] enteric tube is noted ending overlying the body of the
stomach, with the side port noted about the level of the patient's
known small hiatal hernia.

The visualized bowel gas pattern is grossly unremarkable. The lung
bases are better characterized on concurrent chest radiograph. No
acute osseous abnormalities are identified.
IMPRESSION: Enteric tube noted ending overlying the body of the stomach, with
the side port noted about the level of the patient's known small
hiatal hernia.

## 2016-08-31 ENCOUNTER — Other Ambulatory Visit: Payer: Self-pay | Admitting: Physician Assistant

## 2016-10-26 DIAGNOSIS — M7751 Other enthesopathy of right foot: Secondary | ICD-10-CM | POA: Diagnosis not present

## 2016-10-26 DIAGNOSIS — M7752 Other enthesopathy of left foot: Secondary | ICD-10-CM | POA: Diagnosis not present

## 2016-10-26 DIAGNOSIS — M67471 Ganglion, right ankle and foot: Secondary | ICD-10-CM | POA: Diagnosis not present

## 2016-10-26 DIAGNOSIS — M65872 Other synovitis and tenosynovitis, left ankle and foot: Secondary | ICD-10-CM | POA: Diagnosis not present

## 2016-10-26 DIAGNOSIS — M10071 Idiopathic gout, right ankle and foot: Secondary | ICD-10-CM | POA: Diagnosis not present

## 2016-10-28 ENCOUNTER — Other Ambulatory Visit: Payer: Self-pay | Admitting: Physician Assistant

## 2016-11-09 ENCOUNTER — Other Ambulatory Visit: Payer: Self-pay | Admitting: Internal Medicine

## 2016-11-09 DIAGNOSIS — I10 Essential (primary) hypertension: Secondary | ICD-10-CM | POA: Diagnosis not present

## 2016-11-09 DIAGNOSIS — R21 Rash and other nonspecific skin eruption: Secondary | ICD-10-CM | POA: Diagnosis not present

## 2016-11-09 DIAGNOSIS — R198 Other specified symptoms and signs involving the digestive system and abdomen: Secondary | ICD-10-CM | POA: Diagnosis not present

## 2016-11-09 DIAGNOSIS — K5900 Constipation, unspecified: Secondary | ICD-10-CM | POA: Diagnosis not present

## 2016-11-09 DIAGNOSIS — N183 Chronic kidney disease, stage 3 (moderate): Secondary | ICD-10-CM | POA: Diagnosis not present

## 2016-11-09 DIAGNOSIS — K219 Gastro-esophageal reflux disease without esophagitis: Secondary | ICD-10-CM | POA: Diagnosis not present

## 2016-11-17 ENCOUNTER — Ambulatory Visit
Admission: RE | Admit: 2016-11-17 | Discharge: 2016-11-17 | Disposition: A | Discharge status: Home / Self Care | Payer: PPO | Source: Ambulatory Visit | Attending: Internal Medicine | Admitting: Internal Medicine

## 2016-11-17 DIAGNOSIS — R198 Other specified symptoms and signs involving the digestive system and abdomen: Secondary | ICD-10-CM

## 2016-11-17 DIAGNOSIS — K760 Fatty (change of) liver, not elsewhere classified: Secondary | ICD-10-CM | POA: Diagnosis not present

## 2016-11-18 DIAGNOSIS — L304 Erythema intertrigo: Secondary | ICD-10-CM | POA: Diagnosis not present

## 2016-11-23 ENCOUNTER — Telehealth: Payer: Self-pay | Admitting: Internal Medicine

## 2016-11-23 DIAGNOSIS — I462 Cardiac arrest due to underlying cardiac condition: Secondary | ICD-10-CM | POA: Diagnosis not present

## 2016-11-23 DIAGNOSIS — I25810 Atherosclerosis of coronary artery bypass graft(s) without angina pectoris: Secondary | ICD-10-CM | POA: Diagnosis not present

## 2016-11-23 DIAGNOSIS — K760 Fatty (change of) liver, not elsewhere classified: Secondary | ICD-10-CM | POA: Diagnosis not present

## 2016-11-23 DIAGNOSIS — M791 Myalgia: Secondary | ICD-10-CM | POA: Diagnosis not present

## 2016-11-23 NOTE — Telephone Encounter (Signed)
New message    Katie from Dr. Sherilyn Cooter office called to refer pt over to Dr. Radford Pax. Dr. Deforest Hoyles would like for pt to switch cardiologist from Dr. Debara Pickett to Dr. Radford Pax, but she did not state why. Is this ok?

## 2016-11-23 NOTE — Telephone Encounter (Signed)
I think she needs to stay with Dr. Debara Pickett

## 2016-11-24 NOTE — Telephone Encounter (Signed)
I'm fine with whatever makes her happy.  Dr. Lemmie Evens

## 2016-11-24 NOTE — Telephone Encounter (Signed)
Please keep her with Dr. Debara Pickett

## 2016-11-24 NOTE — Telephone Encounter (Signed)
Documentation given to schedulers.

## 2016-11-29 ENCOUNTER — Ambulatory Visit: Payer: PPO | Admitting: Internal Medicine

## 2016-11-30 ENCOUNTER — Telehealth: Payer: Self-pay | Admitting: Internal Medicine

## 2016-11-30 NOTE — Telephone Encounter (Signed)
Received records from [facility] for appointment on 12/26/16 with Dr Debara Pickett.  Records put with Dr Lysbeth Penner schedule for 01/05/17. lp

## 2016-12-26 ENCOUNTER — Ambulatory Visit (INDEPENDENT_AMBULATORY_CARE_PROVIDER_SITE_OTHER): Payer: PPO | Admitting: Internal Medicine

## 2016-12-26 ENCOUNTER — Encounter: Payer: Self-pay | Admitting: Internal Medicine

## 2016-12-26 VITALS — BP 161/84 | HR 72 | Ht 67.5 in | Wt 219.8 lb

## 2016-12-26 DIAGNOSIS — I255 Ischemic cardiomyopathy: Secondary | ICD-10-CM

## 2016-12-26 DIAGNOSIS — Z951 Presence of aortocoronary bypass graft: Secondary | ICD-10-CM

## 2016-12-26 DIAGNOSIS — R0600 Dyspnea, unspecified: Secondary | ICD-10-CM

## 2016-12-26 DIAGNOSIS — R0609 Other forms of dyspnea: Secondary | ICD-10-CM

## 2016-12-26 MED ORDER — CARVEDILOL 12.5 MG PO TABS: 12.5000 mg | ORAL_TABLET | Freq: Two times a day (BID) | ORAL | 3 refills | Status: DC

## 2016-12-26 NOTE — Patient Instructions (Addendum)
Your physician has recommended you make the following change in your medication:  -- HOLD atorvastatin (Lipitor) for 2 weeks  -- if symptoms do not improve, resume medication  -- if symptoms improve, call our office for instruction  -- INCREASE carvedilol to 12.5mg  twice daily  Your physician has requested that you have a limited echocardiogram @ [address] - [address]. Echocardiography is a painless test that uses sound waves to create images of your heart. It provides your doctor with information about the size and shape of your heart and how well your heart's chambers and valves are working. This procedure takes approximately one hour. There are no restrictions for this procedure.  Your physician recommends that you schedule a follow-up appointment in [place] after testing.

## 2016-12-27 NOTE — Progress Notes (Signed)
OFFICE NOTE  Chief Complaint:  Follow-up, stomach bloating  Primary Care Physician: [name], MD  HPI:  [name] [name] is a pleasant 71 year old female who I recently saw the hospital when she was in the cardiac care unit. She has a long standing history of Hypertension and tobacco abuse. She was admitted to [facility] 10/29/2014 with a several week history of worsening symptoms of shortness of breath, chest tightness, fatigue, and URI symptoms. She was initially evaluated by her PCP at which time she was noted to be in Atrial Fibrillation with RVR. She was sent to the emergency department for evaluation. EKG confirmed Atrial Fibrillation, Troponin was negative. CXR was obtained and was concerning for pneumonia and she was started on empiric ABX. She also underwent TTE which showed a reduced EF of 35-40%. She had diffuse global hypokinesis especially of the inferior wall. It was felt the patient should undergo cardiac catheterization for further diagnostics, however patient became anxious and would not consent to the procedure. The patient calmed down and in a few days and further consultation she was agreeable to proceed with cardiac catheterization. This was to be done on 11/03/2014 however, the prior to the procedure the patient became agitated, combativeness and shortness of breath. She progressed to full cardiac arrest with asystole without any preceding signs of symptoms. She was successful resuscitated but showed signs of decerebrate posturing and was placed on the cooling protocol. During cooling she continued to have Atrial Fibrillation which progressed to bradycardia. It was again decided she should undergo cardiac catheterization. This was completed on 11/04/2014 and revealed significant coronary artery disease. It was felt coronary bypass grafting would be the treatment of choice and TCTS was consulted. She was evaluated by Dr. Roxy Manns who agreed at some point she  would benefit from Coronary Bypass grafting procedure. However at the time of evaluation the patient remained on a ventilator and was sedated without signs of neurologic recovery. It was felt medical therapy should be continued at this time and if patient awakes and returns to baseline surgery could be reconsidered at that point.The patient was treated medically for several days. She was successfully weaned and later extubated. She continued to have issues with Atrial Fibrillation. She was again evaluated by Dr. Roxy Manns on 11/11/2014 at which time he felt she was medically stable to proceed with Coronary Bypass grafting procedure. The risks and benefits of the procedure were explained to the patient and she was agreeable to proceed. She was taken to the operating room on 11/14/2014. She underwent CABG x 2 utilizing LIMA to LAD, SVG to OM. She also underwent clipping of her Left Atrial Appendage and endoscopic saphenous vein harvest from her right thigh. She tolerated the procedure well and was taken to the SICU in stable condition. The patient was extubated the evening of surgery. During her stay in the SICU she developed Rapid Atrial Fibrillation. She was treated with Amiodarone and Lopressor, but ultimately did not convert to NSR. EP consult was requested and it was felt cardioversion could be attempted. This was completed on POD # 4 and was unsuccessful. However, the patient ultimately converted to NSR on her own. She remains on Lopressor, which has been titrated as tolerated. She was also placed on Eliquis for stroke prevention.  She returns today for follow-up and is feeling quite well. She denies any chest pain or worsening shortness of breath. She is interesting in getting off of amiodarone completely. She seems to be maintaining sinus  rhythm at a rate of 55 with occasional PVCs.  [name] [name] returns today for follow-up. She denies any chest pain but is reporting some tenderness and heaviness  in the left breast. She feels that it somewhat enlarged and feels heavier than the right side. It was before thought that her pain in the left chest may be related to her sternotomy however her symptoms are worsening. She denies any nipple discharge or skin changes in that area. She had been struggling with a Candida intertrigo which was successfully treated with topical medications. In addition she has some chronic back pain which I think is unrelated. Recently she saw a nurse practitioner and was placed on carvedilol as an alternative to her metoprolol. She seems to be tolerating this fairly well. Heart rate is now higher than it had been previously.  02/08/2016  [name] [name] returns today for follow-up. At her last office visit she was complaining of some breast heaviness and pain and I ordered a mammogram which was negative for any malignancy or masses. She appears to have had resolution of her Candida intertrigo and I've indicated that she could use baby powder or a dry antiperspirant to help with sweating and itching. She continues to have some complaints of numbness and tingling the go down both arms which could be arising from the shoulders or neck. I will refer to orthopedics for this. In addition she reports some shortness of breath and weight gain which she thinks is related to medicines however it seems to be more related to appetite and decreased exercise. She is also back off on her smoking some but has not yet been able to quit.  05/19/2016  [name] [name] is seen today in follow-up. She has a number of unusual complaints today. Most recently however she was placed on a number of medications for blood pressure from her primary care provider which were different for the medications we have listed for her. She noted some side effects from these and says that she stopped taking them and resume taking the medications that she is currently on. Of note she has lost 7 pounds since her last office visit. She  denies any worsening shortness of breath or chest pain. She is due for repeat echo for repeat assessment of her cardiomyopathy.  12/26/2016  [name] [name] was seen today for follow-up. She recently notify the office she wanted to switch cardiologist because she was looking for a female cardiologist that could help her with her hot flashes. She says she breaks out in a sweat for no reason. She's also been having problems with abdominal discomfort. Her ejection fraction actually has improved significantly recently by echo from 35-40% up to 50-55% in October 2017. Despite this, she reports she's been short of breath recently. Blood pressure remains elevated on concerned about the medication compliance.  PMHx:  Past Medical History:  Diagnosis Date  . Acute on chronic combined systolic and diastolic HF (heart failure) ([place]) 10/30/2014   EF 35-40% with RWMA   . Arthritis   . Asthma   . Atrial fibrillation with RVR ([place])   . Cardiomyopathy, ischemic   . Hypertension   . Left main coronary artery disease 11/04/2014  . S/P CABG x 2 with clipping of LA appendage 11/14/2014   LIMA to LAD, SVG to OM, EVH via right thigh    Past Surgical History:  Procedure Laterality Date  . CARDIOVERSION N/A 11/18/2014   Procedure: CARDIOVERSION;  Surgeon: [name], MD;  Location: [facility]  OR;  Service: Cardiovascular;  Laterality: N/A;  . CLIPPING OF ATRIAL APPENDAGE N/A 11/14/2014   Procedure: CLIPPING OF ATRIAL APPENDAGE;  Surgeon: [name], MD;  Location: [place];  Service: Open Heart Surgery;  Laterality: N/A;  . CORONARY ARTERY BYPASS GRAFT N/A 11/14/2014   Procedure: CORONARY ARTERY BYPASS GRAFTING (CABG)TIMES 2 USING LEFT INTERNAL MAMMARY ARTERY AND RIGHT SAPHENOUS VEIN HARVESTED ENDOSCOPICALLY;  Surgeon: [name], MD;  Location: [place];  Service: Open Heart Surgery;  Laterality: N/A;  . LEFT HEART CATHETERIZATION WITH CORONARY ANGIOGRAM N/A 11/04/2014   Procedure: LEFT HEART CATHETERIZATION WITH CORONARY  ANGIOGRAM;  Surgeon: Troy Sine, MD;  Location[facility] CATH LAB;  Service: Cardiovascular;  Laterality: N/A;  . TEE WITHOUT CARDIOVERSION N/A 11/14/2014   Procedure: TRANSESOPHAGEAL ECHOCARDIOGRAM (TEE);  Surgeon: [name], MD;  Location: [place];  Service: Open Heart Surgery;  Laterality: N/A;  . TEMPORARY PACEMAKER INSERTION  11/04/2014   Procedure: TEMPORARY PACEMAKER INSERTION;  Surgeon: Troy Sine, MD;  Location: [facility] CATH LAB;  Service: Cardiovascular;;    FAMHx:  Family History  Problem Relation Age of Onset  . Cancer Mother   . Heart disease Father     SOCHx:   reports that she has been smoking Cigarettes.  She has a 32.00 pack-year smoking history. She has never used smokeless tobacco. She reports that she does not drink alcohol or use drugs.  ALLERGIES:  Allergies  Allergen Reactions  . Bee Venom Anaphylaxis  . Codeine Nausea And Vomiting  . Shrimp [Shellfish Allergy] Swelling    ROS: Pertinent items noted in HPI and remainder of comprehensive ROS otherwise negative.  HOME MEDS: Current Outpatient Prescriptions  Medication Sig Dispense Refill  . acetaminophen (TYLENOL) 500 MG tablet Take 500 mg by mouth every 6 (six) hours as needed for moderate pain.     Marland Kitchen aspirin EC 81 MG EC tablet Take 1 tablet (81 mg total) by mouth daily.    Marland Kitchen atorvastatin (LIPITOR) 10 MG tablet TAKE 1 TABLET EVERY DAY  AT  6PM (Patient taking differently: TAKE 1 TABLET EVERY DAY  AT  6PM - ON HOLD for 2 weeks beginning 12/26/16 (Dr. Debara Pickett)) 90 tablet 3  . carvedilol (COREG) 12.5 MG tablet Take 1 tablet (12.5 mg total) by mouth 2 (two) times daily. 180 tablet 3  . clotrimazole (LOTRIMIN) 1 % cream Apply to affected area 2 times daily 15 g 0  . famotidine (PEPCID) 20 MG tablet Take 1 tablet (20 mg total) by mouth 2 (two) times daily. 30 tablet 0  . glycerin adult (GLYCERIN ADULT) 2 G SUPP Place 1 suppository rectally daily as needed for moderate constipation.    Marland Kitchen losartan (COZAAR) 100 MG tablet  Take 100 mg by mouth daily.    Marland Kitchen triamterene-hydrochlorothiazide (DYAZIDE) 37.5-25 MG capsule Take 1 capsule by mouth daily.    Marland Kitchen [name] 40 MG tablet Take 20 mg by mouth daily as needed (gout flare up).      No current facility-administered medications for this visit.     LABS/IMAGING: No results found for this or any previous visit (from the past 48 hour(s)). No results found.  WEIGHTS: Wt Readings from Last 3 Encounters:  12/26/16 219 lb 12.8 oz (99.7 kg)  05/19/16 217 lb 9.6 oz (98.7 kg)  04/24/16 220 lb (99.8 kg)    VITALS: BP (!) 161/84   Pulse 72   Ht 5' 7.5" (1.715 m)   Wt 219 lb 12.8 oz (99.7 kg)  BMI 33.92 kg/m   EXAM: General appearance: alert, no distress and mildly obese Neck: no carotid bruit and no JVD Lungs: clear to auscultation bilaterally Heart: regular rate and rhythm, S1, S2 normal, no murmur, click, rub or gallop Abdomen: soft, non-tender; bowel sounds normal; no masses,  no organomegaly Extremities: extremities normal, atraumatic, no cyanosis or edema Pulses: 2+ and symmetric Skin: Skin color, texture, turgor normal. No rashes or lesions Neurologic: Grossly normal no changes  EKG: Sinus rhythm with PVCs at 72, possible left atrial enlargement, nonspecific ST and T-wave changes  ASSESSMENT: 1. Coronary artery disease status post CABG 2 with LIMA to LAD and SVG to OM 2. Acute on chronic combined systolic congestive heart failure, EF 35-40% (improved to 50-55% - 10/17) 3. Paroxysmal atrial fibrillation-CHADSVASC 4, - status post left atrial closure (not on anticoagulation) 4. Dyslipidemia 5. Hypertension - uncontrolled 6. Dyspnea on exertion  PLAN: 1.   [name] [name] has a number of complaints today. Mostly related to inappropriate sweating and abdominal discomfort. She also says she is short of breath however EF had improved in October. I question whether she's been compliant with medications and perhaps is had some new decline in ejection  fraction. I'll go ahead and order a limited echo to further evaluate this. I think she would benefit from an increase in her carvedilol 12.5 mg twice a day. Follow-up with me in 1 month.[name], MD, [facility] Attending Cardiologist [place] 12/27/2016, 6:03 PM

## 2017-01-11 ENCOUNTER — Ambulatory Visit (HOSPITAL_COMMUNITY): Payer: PPO | Attending: Cardiovascular Disease

## 2017-01-11 ENCOUNTER — Other Ambulatory Visit: Payer: Self-pay

## 2017-01-11 DIAGNOSIS — Z951 Presence of aortocoronary bypass graft: Secondary | ICD-10-CM | POA: Insufficient documentation

## 2017-01-11 DIAGNOSIS — I255 Ischemic cardiomyopathy: Secondary | ICD-10-CM | POA: Insufficient documentation

## 2017-01-11 DIAGNOSIS — I080 Rheumatic disorders of both mitral and aortic valves: Secondary | ICD-10-CM | POA: Insufficient documentation

## 2017-01-19 ENCOUNTER — Telehealth: Payer: Self-pay | Admitting: Internal Medicine

## 2017-01-19 NOTE — Telephone Encounter (Signed)
Patient called and notified of echo results & reminded of 02/08/17 appt.

## 2017-01-19 NOTE — Telephone Encounter (Signed)
Returning your call from this morning,concerning her Echo results.

## 2017-01-23 DIAGNOSIS — M19071 Primary osteoarthritis, right ankle and foot: Secondary | ICD-10-CM | POA: Diagnosis not present

## 2017-01-23 DIAGNOSIS — M659 Synovitis and tenosynovitis, unspecified: Secondary | ICD-10-CM | POA: Diagnosis not present

## 2017-01-23 DIAGNOSIS — M7751 Other enthesopathy of right foot: Secondary | ICD-10-CM | POA: Diagnosis not present

## 2017-01-30 ENCOUNTER — Encounter (HOSPITAL_COMMUNITY): Payer: Self-pay | Admitting: Emergency Medicine

## 2017-01-30 ENCOUNTER — Observation Stay (HOSPITAL_COMMUNITY)
Admission: EM | Admit: 2017-01-30 | Discharge: 2017-01-31 | Disposition: A | Discharge status: Home / Self Care | Payer: PPO | Attending: Internal Medicine | Admitting: Internal Medicine

## 2017-01-30 ENCOUNTER — Emergency Department (HOSPITAL_COMMUNITY): Payer: PPO

## 2017-01-30 DIAGNOSIS — I255 Ischemic cardiomyopathy: Secondary | ICD-10-CM | POA: Diagnosis not present

## 2017-01-30 DIAGNOSIS — R072 Precordial pain: Secondary | ICD-10-CM | POA: Diagnosis not present

## 2017-01-30 DIAGNOSIS — N183 Chronic kidney disease, stage 3 unspecified: Secondary | ICD-10-CM | POA: Diagnosis present

## 2017-01-30 DIAGNOSIS — I480 Paroxysmal atrial fibrillation: Secondary | ICD-10-CM | POA: Diagnosis not present

## 2017-01-30 DIAGNOSIS — I5042 Chronic combined systolic (congestive) and diastolic (congestive) heart failure: Secondary | ICD-10-CM | POA: Diagnosis not present

## 2017-01-30 DIAGNOSIS — R0602 Shortness of breath: Secondary | ICD-10-CM | POA: Diagnosis not present

## 2017-01-30 DIAGNOSIS — E876 Hypokalemia: Secondary | ICD-10-CM | POA: Insufficient documentation

## 2017-01-30 DIAGNOSIS — Z79899 Other long term (current) drug therapy: Secondary | ICD-10-CM | POA: Insufficient documentation

## 2017-01-30 DIAGNOSIS — F17210 Nicotine dependence, cigarettes, uncomplicated: Secondary | ICD-10-CM | POA: Diagnosis not present

## 2017-01-30 DIAGNOSIS — K219 Gastro-esophageal reflux disease without esophagitis: Secondary | ICD-10-CM | POA: Insufficient documentation

## 2017-01-30 DIAGNOSIS — I10 Essential (primary) hypertension: Secondary | ICD-10-CM | POA: Diagnosis present

## 2017-01-30 DIAGNOSIS — I2583 Coronary atherosclerosis due to lipid rich plaque: Secondary | ICD-10-CM

## 2017-01-30 DIAGNOSIS — I2510 Atherosclerotic heart disease of native coronary artery without angina pectoris: Secondary | ICD-10-CM | POA: Diagnosis not present

## 2017-01-30 DIAGNOSIS — E785 Hyperlipidemia, unspecified: Secondary | ICD-10-CM | POA: Insufficient documentation

## 2017-01-30 DIAGNOSIS — I4891 Unspecified atrial fibrillation: Secondary | ICD-10-CM | POA: Diagnosis present

## 2017-01-30 DIAGNOSIS — I130 Hypertensive heart and chronic kidney disease with heart failure and stage 1 through stage 4 chronic kidney disease, or unspecified chronic kidney disease: Secondary | ICD-10-CM | POA: Diagnosis not present

## 2017-01-30 DIAGNOSIS — R079 Chest pain, unspecified: Secondary | ICD-10-CM | POA: Diagnosis not present

## 2017-01-30 DIAGNOSIS — I5023 Acute on chronic systolic (congestive) heart failure: Secondary | ICD-10-CM | POA: Diagnosis present

## 2017-01-30 DIAGNOSIS — Z7982 Long term (current) use of aspirin: Secondary | ICD-10-CM | POA: Diagnosis not present

## 2017-01-30 DIAGNOSIS — M109 Gout, unspecified: Secondary | ICD-10-CM | POA: Diagnosis not present

## 2017-01-30 DIAGNOSIS — J45909 Unspecified asthma, uncomplicated: Secondary | ICD-10-CM | POA: Diagnosis not present

## 2017-01-30 DIAGNOSIS — Z951 Presence of aortocoronary bypass graft: Secondary | ICD-10-CM | POA: Insufficient documentation

## 2017-01-30 DIAGNOSIS — I5032 Chronic diastolic (congestive) heart failure: Secondary | ICD-10-CM | POA: Diagnosis present

## 2017-01-30 LAB — BASIC METABOLIC PANEL
Anion gap: 10 (ref 5–15)
BUN: 26 mg/dL — ABNORMAL HIGH (ref 6–20)
CO2: 23 mmol/L (ref 22–32)
Calcium: 9.3 mg/dL (ref 8.9–10.3)
Chloride: 108 mmol/L (ref 101–111)
Creatinine, Ser: 1.2 mg/dL — ABNORMAL HIGH (ref 0.44–1.00)
GFR calc Af Amer: 51 mL/min — ABNORMAL LOW (ref >60)
GFR calc non Af Amer: 44 mL/min — ABNORMAL LOW (ref >60)
Glucose, Bld: 115 mg/dL — ABNORMAL HIGH (ref 65–99)
Potassium: 3.4 mmol/L — ABNORMAL LOW (ref 3.5–5.1)
Sodium: 141 mmol/L (ref 135–145)

## 2017-01-30 LAB — CBC
HCT: 45.4 % (ref 36.0–46.0)
Hemoglobin: 15.9 g/dL — ABNORMAL HIGH (ref 12.0–15.0)
MCH: 30.1 pg (ref 26.0–34.0)
MCHC: 35 g/dL (ref 30.0–36.0)
MCV: 85.8 fL (ref 78.0–100.0)
Platelets: 171 10*3/uL (ref 150–400)
RBC: 5.29 MIL/uL — ABNORMAL HIGH (ref 3.87–5.11)
RDW: 15.1 % (ref 11.5–15.5)
WBC: 12.1 10*3/uL — ABNORMAL HIGH (ref 4.0–10.5)

## 2017-01-30 LAB — APTT: aPTT: 35 seconds (ref 24–36)

## 2017-01-30 LAB — PROTIME-INR
INR: 1.01
Prothrombin Time: 13.3 seconds (ref 11.4–15.2)

## 2017-01-30 LAB — POCT I-STAT TROPONIN I: Troponin i, poc: 0.03 ng/mL (ref 0.00–0.08)

## 2017-01-30 MED ORDER — CARVEDILOL 6.25 MG PO TABS
6.2500 mg | ORAL_TABLET | Freq: Two times a day (BID) | ORAL | Status: DC
  Administered 2017-01-31 (×2): 6.25 mg via ORAL
  Filled 2017-01-30 (×2): qty 1

## 2017-01-30 MED ORDER — HEPARIN BOLUS VIA INFUSION
4000.0000 [IU] | Freq: Once | INTRAVENOUS | Status: AC
  Administered 2017-01-30: 4000 [IU] via INTRAVENOUS
  Filled 2017-01-30: qty 4000

## 2017-01-30 MED ORDER — FAMOTIDINE 20 MG PO TABS
10.0000 mg | ORAL_TABLET | Freq: Two times a day (BID) | ORAL | Status: DC
  Administered 2017-01-31 (×2): 10 mg via ORAL
  Filled 2017-01-30 (×2): qty 1

## 2017-01-30 MED ORDER — DILTIAZEM HCL-DEXTROSE 100-5 MG/100ML-% IV SOLN (PREMIX): 5.0000 mg/h | INTRAVENOUS | Status: DC

## 2017-01-30 MED ORDER — ENOXAPARIN SODIUM 40 MG/0.4ML ~~LOC~~ SOLN: 40.0000 mg | SUBCUTANEOUS | Status: DC

## 2017-01-30 MED ORDER — ACETAMINOPHEN 650 MG RE SUPP
650.0000 mg | Freq: Four times a day (QID) | RECTAL | Status: DC | PRN
Start: 2017-01-30 — End: 2017-01-31

## 2017-01-30 MED ORDER — DILTIAZEM HCL-DEXTROSE 100-5 MG/100ML-% IV SOLN (PREMIX)
5.0000 mg/h | INTRAVENOUS | Status: DC
  Administered 2017-01-30: 5 mg/h via INTRAVENOUS
  Filled 2017-01-30: qty 100

## 2017-01-30 MED ORDER — POTASSIUM CHLORIDE CRYS ER 20 MEQ PO TBCR
40.0000 meq | EXTENDED_RELEASE_TABLET | Freq: Two times a day (BID) | ORAL | Status: AC
  Administered 2017-01-30 – 2017-01-31 (×2): 40 meq via ORAL
  Filled 2017-01-30 (×2): qty 2

## 2017-01-30 MED ORDER — SODIUM CHLORIDE 0.9% FLUSH
3.0000 mL | Freq: Two times a day (BID) | INTRAVENOUS | Status: DC
  Administered 2017-01-31: 3 mL via INTRAVENOUS

## 2017-01-30 MED ORDER — ACETAMINOPHEN 325 MG PO TABS
650.0000 mg | ORAL_TABLET | Freq: Four times a day (QID) | ORAL | Status: DC | PRN
  Administered 2017-01-31 (×2): 650 mg via ORAL
  Filled 2017-01-30 (×2): qty 2

## 2017-01-30 MED ORDER — ASPIRIN EC 81 MG PO TBEC
81.0000 mg | DELAYED_RELEASE_TABLET | Freq: Every day | ORAL | Status: DC
  Administered 2017-01-31: 81 mg via ORAL
  Filled 2017-01-30: qty 1

## 2017-01-30 MED ORDER — ASPIRIN 81 MG PO CHEW
324.0000 mg | CHEWABLE_TABLET | Freq: Once | ORAL | Status: AC
  Administered 2017-01-30: 324 mg via ORAL
  Filled 2017-01-30: qty 4

## 2017-01-30 MED ORDER — DILTIAZEM LOAD VIA INFUSION
10.0000 mg | Freq: Once | INTRAVENOUS | Status: AC
  Administered 2017-01-30: 10 mg via INTRAVENOUS
  Filled 2017-01-30: qty 10

## 2017-01-30 MED ORDER — ATORVASTATIN CALCIUM 10 MG PO TABS: 10.0000 mg | ORAL_TABLET | Freq: Every day | ORAL | Status: DC

## 2017-01-30 MED ORDER — HEPARIN (PORCINE) IN NACL 100-0.45 UNIT/ML-% IJ SOLN
1200.0000 [IU]/h | INTRAMUSCULAR | Status: DC
  Administered 2017-01-30: 1200 [IU]/h via INTRAVENOUS
  Filled 2017-01-30: qty 250

## 2017-01-30 NOTE — ED Notes (Signed)
Pt to xray

## 2017-01-30 NOTE — ED Triage Notes (Signed)
Pt from home with central chest burning and sob that began yesterday. Pt has hx of open heart surgery last year. Pt states she has had tachycardia x 2 days. Pt is in Afib at time of assessment and has hx of Afib with RVR. Pt hs rate going from 125-160 at time of triage. Pt has clear lung sounds

## 2017-01-30 NOTE — ED Notes (Addendum)
Gave report to ICU charge nurse, Gilmer Mor, RN.

## 2017-01-30 NOTE — ED Notes (Signed)
Provided patient a [place] sandwich, chicken noodle soup, and a sprite with permission from Dr. Laverta Baltimore.

## 2017-01-30 NOTE — ED Provider Notes (Signed)
Emergency Department Provider Note   I have reviewed the triage vital signs and the nursing notes.   HISTORY  Chief Complaint Shortness of Breath and Tachycardia   HPI [name] [name] is a 71 y.o. female with PMH of pAfib, HTN, CAD s/p CABG and CHF presents to the emergency department for evaluation of intermittent shortness of breath with associated chest pain and heart palpitations. She was unclear about a prior diagnosis of A. Fib. She reports taking her aspirin and carvedilol as directed including carvedilol this morning. Yesterday she developed some shortness of breath and "jittery" feeling in her chest with some central discomfort. When the symptoms did not resolve today she presented to the emergency department. She notes intermittent episodes of similar symptoms that resolved spontaneously over the past several weeks but this episodes more severe. She does follow with her cardiologist Dr. Debara Pickett but reports that they have a strained relationship. She reports taking aspirin but otherwise does not take anticoagulation. She is not currently having chest discomfort but is feeling a fluttering sensation. No fever or chills.    Past Medical History:  Diagnosis Date  . Acute on chronic combined systolic and diastolic HF (heart failure) ([place]) 10/30/2014   EF 35-40% with RWMA   . Arthritis   . Asthma   . Atrial fibrillation with RVR ([place])   . Cardiomyopathy, ischemic   . Hypertension   . Left main coronary artery disease 11/04/2014  . S/P CABG x 2 with clipping of LA appendage 11/14/2014   LIMA to LAD, SVG to OM, EVH via right thigh    Patient Active Problem List   Diagnosis Date Noted  . CKD (chronic kidney disease), stage III 01/30/2017  . Pain in shoulder 02/08/2016  . Breast pain, left 02/08/2016  . Bilateral arm numbness and tingling while sleeping 02/08/2016  . Painful lumpy left breast 09/23/2015  . [place] intertrigo 02/11/2015  . Hx of CABG 11/14/2014  . Accelerated  hypertension   . Cardiac arrest ([place]) 11/04/2014  . Left main coronary artery disease 11/04/2014  . Coronary artery disease due to lipid rich plaque   . Acute respiratory failure with hypoxemia ([place])   . Essential hypertension   . Atrial fibrillation with RVR ([place])   . Hypokalemia 10/30/2014  . Chronic combined systolic and diastolic CHF (congestive heart failure) ([place]) 10/30/2014  . NSTEMI (non-ST elevated myocardial infarction) ([place])   . DOE (dyspnea on exertion)   . CAP (community acquired pneumonia) 10/29/2014    Past Surgical History:  Procedure Laterality Date  . CARDIOVERSION N/A 11/18/2014   Procedure: CARDIOVERSION;  Surgeon: [name], MD;  Location: [facility];  Service: Cardiovascular;  Laterality: N/A;  . CLIPPING OF ATRIAL APPENDAGE N/A 11/14/2014   Procedure: CLIPPING OF ATRIAL APPENDAGE;  Surgeon: [name], MD;  Location: [facility];  Service: Open Heart Surgery;  Laterality: N/A;  . CORONARY ARTERY BYPASS GRAFT N/A 11/14/2014   Procedure: CORONARY ARTERY BYPASS GRAFTING (CABG)TIMES 2 USING LEFT INTERNAL MAMMARY ARTERY AND RIGHT SAPHENOUS VEIN HARVESTED ENDOSCOPICALLY;  Surgeon: [name], MD;  Location: [facility];  Service: Open Heart Surgery;  Laterality: N/A;  . LEFT HEART CATHETERIZATION WITH CORONARY ANGIOGRAM N/A 11/04/2014   Procedure: LEFT HEART CATHETERIZATION WITH CORONARY ANGIOGRAM;  Surgeon: Troy Sine, MD;  Location: [facility] CATH LAB;  Service: Cardiovascular;  Laterality: N/A;  . TEE WITHOUT CARDIOVERSION N/A 11/14/2014   Procedure: TRANSESOPHAGEAL ECHOCARDIOGRAM (TEE);  Surgeon: [name], MD;  Location: [facility];  Service: Open Heart Surgery;  Laterality: N/A;  . TEMPORARY PACEMAKER INSERTION  11/04/2014   Procedure: TEMPORARY PACEMAKER INSERTION;  Surgeon: Troy Sine, MD;  Location: [facility] CATH LAB;  Service: Cardiovascular;;      Allergies Bee venom; Codeine; Shrimp [shellfish allergy]; and Tomato  Family History  Problem Relation Age of Onset  .  Cancer Mother   . Heart disease Father     Social History Social History  Substance Use Topics  . Smoking status: Current Some Day Smoker    Packs/day: 1.00    Years: 32.00    Types: Cigarettes  . Smokeless tobacco: Never Used     Comment: occasional puff here and there - 02/09/15  . Alcohol use No    Review of Systems  Constitutional: No fever/chills Eyes: No visual changes. ENT: No sore throat. Cardiovascular: Positive chest pain. Respiratory: Positive shortness of breath. Gastrointestinal: No abdominal pain.  No nausea, no vomiting.  No diarrhea.  No constipation. Genitourinary: Negative for dysuria. Musculoskeletal: Negative for back pain. Skin: Negative for rash. Neurological: Negative for headaches, focal weakness or numbness.  10-point ROS otherwise negative.  ____________________________________________   PHYSICAL EXAM:  VITAL SIGNS: Vitals:   01/30/17 2348 01/31/17 0000  BP: (!) 152/91 (!) 169/96  Pulse: 92 (!) 39  Resp: (!) 24 17  Temp: 97.5 F (36.4 C)     Constitutional: Alert and oriented. Well appearing and in no acute distress. Eyes: Conjunctivae are normal. Head: Atraumatic. Nose: No congestion/rhinnorhea. Mouth/Throat: Mucous membranes are moist.  Oropharynx non-erythematous. Neck: No stridor.  No meningeal signs.   Cardiovascular: A-fib RVR. Good peripheral circulation. Grossly normal heart sounds.   Respiratory: Normal respiratory effort.  No retractions. Lungs CTAB. Gastrointestinal: Soft and nontender. No distention.  Musculoskeletal: No lower extremity tenderness nor edema. No gross deformities of extremities. Neurologic:  Normal speech and language. No gross focal neurologic deficits are appreciated.  Skin:  Skin is warm, dry and intact. No rash noted.  ____________________________________________   LABS (all labs ordered are listed, but only abnormal results are displayed)  Labs Reviewed  BASIC METABOLIC PANEL - Abnormal; Notable  for the following:       Result Value   Potassium 3.4 (*)    Glucose, Bld 115 (*)    BUN 26 (*)    Creatinine, Ser 1.20 (*)    GFR calc non Af Amer 44 (*)    GFR calc Af Amer 51 (*)    All other components within normal limits  CBC - Abnormal; Notable for the following:    WBC 12.1 (*)    RBC 5.29 (*)    Hemoglobin 15.9 (*)    All other components within normal limits  MRSA PCR SCREENING  APTT  PROTIME-INR  CBC  BASIC METABOLIC PANEL  TROPONIN I  TROPONIN I  TSH  MAGNESIUM  I-STAT TROPOININ, ED  POCT I-STAT TROPONIN I   ____________________________________________  EKG   EKG Interpretation  Date/Time:  Monday January 30 2017 18:15:10 EDT Ventricular Rate:  153 PR Interval:    QRS Duration: 94 QT Interval:  299 QTC Calculation: 477 R Axis:   15 Text Interpretation:  Atrial fibrillation with rapid V-rate Probable LVH with secondary repol abnrm Baseline wander in lead(s) III aVF No STEMI.  Confirmed by Nanda Quinton [phone]) on 01/30/2017 6:45:00 PM       ____________________________________________  RADIOLOGY  Dg Chest 2 View  Result Date: 01/30/2017 CLINICAL DATA:  Shortness of breath and chest pain EXAM: CHEST  2 VIEW COMPARISON:  April 24, 2016 FINDINGS: There is scarring in the left mid lung. There is no edema or consolidation. Heart is mildly enlarged with pulmonary vascularity within normal limits. No adenopathy. Aorta is tortuous with atherosclerotic calcification in the aorta. Patient is status post coronary artery bypass grafting. There is a left atrial appendage clamp. There is degenerative change in the thoracic spine. IMPRESSION: Scarring left mid lung. No edema or consolidation. Stable cardiac prominence. Aorta is tortuous with aortic atherosclerosis. Postoperative change, stable. Electronically Signed   By: Lowella Grip III M.D.   On: 01/30/2017 19:49    ____________________________________________   PROCEDURES  Procedure(s) performed:    Procedures  CRITICAL CARE Performed by: Margette Fast Total critical care time: 35 minutes Critical care time was exclusive of separately billable procedures and treating other patients. Critical care was necessary to treat or prevent imminent or life-threatening deterioration. Critical care was time spent personally by me on the following activities: development of treatment plan with patient and/or surrogate as well as nursing, discussions with consultants, evaluation of patient's response to treatment, examination of patient, obtaining history from patient or surrogate, ordering and performing treatments and interventions, ordering and review of laboratory studies, ordering and review of radiographic studies, pulse oximetry and re-evaluation of patient's condition.  Nanda Quinton, MD Emergency Medicine  ____________________________________________   INITIAL IMPRESSION / ASSESSMENT AND PLAN / ED COURSE  Pertinent labs & imaging results that were available during my care of the patient were reviewed by me and considered in my medical decision making (see chart for details).  Patient presents to the emergency department for evaluation of intermittent chest discomfort with fluttering sensation in her chest that started last night with some difficulty breathing. She's had intermittent symptoms for the past several weeks. Do not feel this is a good candidate for cardioversion given her intermittent symptoms and no anticoagulation. Plan for chest x-ray, troponin, baseline labs. Will initiate diltiazem infusion and give full dose aspirin with chest pain. Plan for cardiology consultation.   07:13 PM Spoke with Dr. Meda Coffee with Cardiology. Recommends Cardizem infusion and troponin trending. They will see in consultation.   Patient with improved HR on Diltiazem infusion. Heparin order placed by Cardiology. They will consult in the AM.   Discussed patient's case with Hospitalist, Dr. Loleta Books.  Patient and family (if present) updated with plan. Care transferred to Hospitalist service.  I reviewed all nursing notes, vitals, pertinent old records, EKGs, labs, imaging (as available).  ____________________________________________  FINAL CLINICAL IMPRESSION(S) / ED DIAGNOSES  Final diagnoses:  Atrial fibrillation with rapid ventricular response (HCC)  Precordial chest pain  Shortness of breath     MEDICATIONS GIVEN DURING THIS VISIT:  Medications  potassium chloride SA (K-DUR,KLOR-CON) CR tablet 40 mEq (40 mEq Oral Given 01/30/17 2225)  famotidine (PEPCID) tablet 10 mg (not administered)  carvedilol (COREG) tablet 6.25 mg (not administered)  atorvastatin (LIPITOR) tablet 10 mg (not administered)  aspirin EC tablet 81 mg (not administered)  enoxaparin (LOVENOX) injection 40 mg (not administered)  sodium chloride flush (NS) 0.9 % injection 3 mL (3 mLs Intravenous Not Given 01/30/17 2345)  acetaminophen (TYLENOL) tablet 650 mg (not administered)    Or  acetaminophen (TYLENOL) suppository 650 mg (not administered)  diltiazem (CARDIZEM) 100 mg in dextrose 5% 137mL (1 mg/mL) infusion (5 mg/hr Intravenous Rate/Dose Verify 01/31/17 0000)  diltiazem (CARDIZEM) 1 mg/mL load via infusion 10 mg (10 mg Intravenous Bolus from Bag 01/30/17 2043)  aspirin chewable tablet 324 mg (  324 mg Oral Given 01/30/17 2024)  heparin bolus via infusion 4,000 Units (4,000 Units Intravenous Bolus from Bag 01/30/17 2116)     NEW OUTPATIENT MEDICATIONS STARTED DURING THIS VISIT:  None   Note:  This document was prepared using Dragon voice recognition software and may include unintentional dictation errors.  Nanda Quinton, MD Emergency Medicine   Virdia Ziesmer, [facility], MD 01/31/17 Benancio Deeds

## 2017-01-30 NOTE — Progress Notes (Signed)
ANTICOAGULATION CONSULT NOTE - Initial Consult  Pharmacy Consult for Heparin Indication: atrial fibrillation  Allergies  Allergen Reactions  . Bee Venom Anaphylaxis  . Codeine Nausea And Vomiting  . Shrimp [Shellfish Allergy] Swelling  . Tomato     Pt states it gives her gout    Patient Measurements: Height: 5' 7.5" (171.5 cm) Weight: 220 lb (99.8 kg) IBW/kg (Calculated) : 62.75 Heparin Dosing Weight: 84  Vital Signs:    Labs: No results for input(s): HGB, HCT, PLT, APTT, LABPROT, INR, HEPARINUNFRC, HEPRLOWMOCWT, CREATININE, CKTOTAL, CKMB, TROPONINI in the last 72 hours.  CrCl cannot be calculated (Patient's most recent lab result is older than the maximum 21 days allowed.).   Medical History: Past Medical History:  Diagnosis Date  . Acute on chronic combined systolic and diastolic HF (heart failure) ([place]) 10/30/2014   EF 35-40% with RWMA   . Arthritis   . Asthma   . Atrial fibrillation with RVR ([place])   . Cardiomyopathy, ischemic   . Hypertension   . Left main coronary artery disease 11/04/2014  . S/P CABG x 2 with clipping of LA appendage 11/14/2014   LIMA to LAD, SVG to OM, EVH via right thigh    Assessment:  71 yr female presents with chest burning and shortness of breath.  Patient found to be in AFib.  PMH significant for open heart surgery last year and AFib with RVR.   Patient on no oral anticoagulation PTA  Pharmacy consulted to begin dosing IV heparin for AFib  Goal of Therapy:  Heparin level 0.3-0.7 units/ml Monitor platelets by anticoagulation protocol: Yes   Plan:   Obtain baseline aPTT and PT/INR per protocol  Begin heparin 4000 unit IV bolus x 1 followed by heparin infusion @ 1200 units/hr  Check heparin level 8 hr after heparin started  Check daily heparin level & CBC  Erby Sanderson, Toribio Harbour , PharmD 01/30/2017,7:32 PM

## 2017-01-30 NOTE — ED Notes (Signed)
Patient called for triage with no response.  

## 2017-01-30 NOTE — ED Notes (Signed)
Pt being transported to radiology

## 2017-01-30 NOTE — ED Notes (Signed)
Attempted an IV x 3 times by Di Kindle  RN and Faylene Million RN. Asked Jake RN to obtained an ultrasound IV.

## 2017-01-31 DIAGNOSIS — N183 Chronic kidney disease, stage 3 (moderate): Secondary | ICD-10-CM

## 2017-01-31 DIAGNOSIS — E876 Hypokalemia: Secondary | ICD-10-CM | POA: Diagnosis not present

## 2017-01-31 DIAGNOSIS — I2583 Coronary atherosclerosis due to lipid rich plaque: Secondary | ICD-10-CM | POA: Diagnosis not present

## 2017-01-31 DIAGNOSIS — R072 Precordial pain: Secondary | ICD-10-CM | POA: Diagnosis not present

## 2017-01-31 DIAGNOSIS — I4891 Unspecified atrial fibrillation: Secondary | ICD-10-CM

## 2017-01-31 DIAGNOSIS — I10 Essential (primary) hypertension: Secondary | ICD-10-CM | POA: Diagnosis not present

## 2017-01-31 DIAGNOSIS — E785 Hyperlipidemia, unspecified: Secondary | ICD-10-CM

## 2017-01-31 DIAGNOSIS — I2510 Atherosclerotic heart disease of native coronary artery without angina pectoris: Secondary | ICD-10-CM | POA: Diagnosis not present

## 2017-01-31 DIAGNOSIS — I5042 Chronic combined systolic (congestive) and diastolic (congestive) heart failure: Secondary | ICD-10-CM

## 2017-01-31 DIAGNOSIS — I5022 Chronic systolic (congestive) heart failure: Secondary | ICD-10-CM

## 2017-01-31 LAB — CBC
HCT: 44 % (ref 36.0–46.0)
Hemoglobin: 15.1 g/dL — ABNORMAL HIGH (ref 12.0–15.0)
MCH: 29.6 pg (ref 26.0–34.0)
MCHC: 34.3 g/dL (ref 30.0–36.0)
MCV: 86.3 fL (ref 78.0–100.0)
Platelets: 162 10*3/uL (ref 150–400)
RBC: 5.1 MIL/uL (ref 3.87–5.11)
RDW: 15.1 % (ref 11.5–15.5)
WBC: 9.7 10*3/uL (ref 4.0–10.5)

## 2017-01-31 LAB — BASIC METABOLIC PANEL
Anion gap: 10 (ref 5–15)
BUN: 24 mg/dL — ABNORMAL HIGH (ref 6–20)
CO2: 21 mmol/L — ABNORMAL LOW (ref 22–32)
Calcium: 8.8 mg/dL — ABNORMAL LOW (ref 8.9–10.3)
Chloride: 110 mmol/L (ref 101–111)
Creatinine, Ser: 1.04 mg/dL — ABNORMAL HIGH (ref 0.44–1.00)
GFR calc Af Amer: >60 mL/min (ref >60)
GFR calc non Af Amer: 52 mL/min — ABNORMAL LOW (ref >60)
Glucose, Bld: 110 mg/dL — ABNORMAL HIGH (ref 65–99)
Potassium: 3.6 mmol/L (ref 3.5–5.1)
Sodium: 141 mmol/L (ref 135–145)

## 2017-01-31 LAB — MAGNESIUM: Magnesium: 1.9 mg/dL (ref 1.7–2.4)

## 2017-01-31 LAB — TSH: TSH: 1.142 u[IU]/mL (ref 0.350–4.500)

## 2017-01-31 LAB — TROPONIN I
Troponin I: 0.03 ng/mL (ref <0.03)
Troponin I: <0.03 ng/mL (ref <0.03)

## 2017-01-31 LAB — MRSA PCR SCREENING: MRSA by PCR: NEGATIVE

## 2017-01-31 MED ORDER — CARVEDILOL 6.25 MG PO TABS: 6.2500 mg | ORAL_TABLET | Freq: Two times a day (BID) | ORAL | 0 refills | Status: DC

## 2017-01-31 NOTE — Progress Notes (Signed)
Patient verbalized understanding of discharge instructions. Patient is stable at discharge. 

## 2017-01-31 NOTE — Progress Notes (Signed)
Nutrition Brief Note  Patient identified on the Malnutrition Screening Tool (MST) Report  Wt Readings from Last 15 Encounters:  01/30/17 217 lb 2.5 oz (98.5 kg)  12/26/16 219 lb 12.8 oz (99.7 kg)  05/19/16 217 lb 9.6 oz (98.7 kg)  04/24/16 220 lb (99.8 kg)  02/08/16 224 lb (101.6 kg)  09/23/15 221 lb (100.2 kg)  08/28/15 218 lb (98.9 kg)  07/13/15 216 lb (98 kg)  05/12/15 216 lb 4.8 oz (98.1 kg)  02/09/15 208 lb 6.4 oz (94.5 kg)  01/26/15 205 lb 8 oz (93.2 kg)  12/22/14 208 lb (94.3 kg)  11/23/14 208 lb 12.4 oz (94.7 kg)  10/09/14 214 lb 3.2 oz (97.2 kg)  09/18/14 215 lb (97.5 kg)    Body mass index is 34.01 kg/m. Patient meets criteria for obesity based on current BMI. Skin WDL. PTA pt was experiencing intermittent palpitations and SOB. She was admitted for the same.   Current diet order is Heart Healthy and pt reports good appetite now and PTA. For breakfast this AM she had [place] sausage, breakfast potatoes, scrambled eggs, wheat toast, and coffee (~490 kcal and 23 grams of protein). Labs and medications reviewed.   No nutrition interventions warranted at this time. If nutrition issues arise, please consult RD.     Jarome Matin, MS, RD, LDN, [facility] Inpatient Clinical Dietitian Pager # [phone] After hours/weekend pager # [phone]

## 2017-01-31 NOTE — Discharge Summary (Signed)
Physician Discharge Summary  [name] [name] NWG:[id number] DOB: 1945-10-15 DOA: 01/30/2017  PCP: [name] Low, MD  Admit date: 01/30/2017 Discharge date: 01/31/2017  Time spent: 30 minutes  Recommendations for Outpatient Follow-up:  Patient will be discharged to home.  Patient will need to follow up with primary care provider within one week of discharge., repeat BMP. Follow up with cardiology, Dr. Debara Pickett in one week. Patient should continue medications as prescribed.  Patient should follow a heart healthy diet.   Discharge Diagnoses:  Atrial fibrillation, Paroxysmal Essential hypertension Coronary artery disease Chronic systolic congestive heart failure Chronic kidney disease, Stage III GERD Hypokalemia Hyperlipidemia  Discharge Condition: Stable   Diet recommendation: heart healthy  Filed Weights   01/30/17 1928 01/30/17 2008 01/30/17 2348  Weight: 99.8 kg (220 lb) 99.8 kg (220 lb) 98.5 kg (217 lb 2.5 oz)    History of present illness:  on 01/30/2017 by Dr. Margrett Rud a 71 y.o.femalewith a past medical history significant for Afib, CAD s/p arrest with CABG in 2016 and LAA ligation, CKD III, and goutwho presents with palpitations. The patient was in her usual state of health until the last few weeks and she's had intermittent episodes of palpitations and shortness of breath. Then yesterday, the sensation started again, did not resolve by itself, began to be associated with progressiveshortness of breath with any exertion and vague chest discomfort so she came to the emergency room. She has been taking her carvedilol daily, although she is very vague about what she takes and there are discrepancies between her medication reconciliation with me and the Pharm tech.   Hospital Course:  Atrial fibrillation, Paroxysmal -CHADSVASC 4, patient not on anticoagulation states she takes aspirin -Patient required diltiazem drip upon admission, has been weaned  off as patient now is bradycardic heart rates in the 50 -Patient also on Coreg, per cardiology, Dr. Debara Pickett, progress note from 12/26/2016, Coreg to be 12.5 twice daily -Cardiology consulted and appreciated, Recommended continuing Coreg 6.25 mg twice a day which supposedly she was not taking. [name] discussed with Dr. Debara Pickett, okay with not being on anticoagulation because of her LAA closure during surgery -Patient has converted back to sinus rhythm -TSH 1.142, magnesium 1.9  Essential hypertension -Continue Coreg  Coronary artery disease -Status post CABG 2 -Continue Coreg, aspirin  Chronic systolic congestive heart failure -Last echocardiogram 01/11/2017: EF 57-84%, Diastolic function parameters were normal -EF was 35-40% in 2016 -Currently appears to be euvolemic  -Monitor intake/output, daily weights -Diuretics currently held- continue upon discharge   Chronic kidney disease, Stage III -Creatinine currently stable, continue to monitor BMP  GERD -Continue H2 blocker  Hypokalemia -potassium 3.6, continue supplementation  Hyperlipidemia -Continue statin  Procedures: None  Consultations: Cardiology  Discharge Exam: Vitals:   01/31/17 1100 01/31/17 1200  BP: (!) 164/66   Pulse: (!) 56   Resp: 18   Temp:  98.1 F (36.7 C)   Patient denies any current chest pain, shortness breath, abdominal pain, nausea or vomit, diarrhea or constipation, headache or dizziness. No longer feels fluttering in her chest.   General: Well developed, well nourished, NAD, appears stated age  HEENT: NCAT, mucous membranes moist.  Cardiovascular: S1 S2 auscultated, no rubs, murmurs or gallops. Regular rate and rhythm.  Respiratory: Clear to auscultation bilaterally with equal chest rise  Abdomen: Soft, nontender, nondistended, + bowel sounds  Extremities: warm dry without cyanosis clubbing or edema  Neuro: AAOx3, nonfocal  Psych: appropriate  Discharge  Instructions Discharge Instructions  Discharge instructions    Complete by:  As directed    Patient will be discharged to home.  Patient will need to follow up with primary care provider within one week of discharge., repeat BMP. Follow up with cardiology, Dr. Debara Pickett in one week. Patient should continue medications as prescribed.  Patient should follow a heart healthy diet.     Current Discharge Medication List    CONTINUE these medications which have CHANGED   Details  carvedilol (COREG) 6.25 MG tablet Take 1 tablet (6.25 mg total) by mouth 2 (two) times daily. Qty: 60 tablet, Refills: 0      CONTINUE these medications which have NOT CHANGED   Details  acetaminophen (TYLENOL) 500 MG tablet Take 500 mg by mouth every 6 (six) hours as needed for moderate pain.     aspirin EC 81 MG EC tablet Take 1 tablet (81 mg total) by mouth daily.    atorvastatin (LIPITOR) 10 MG tablet TAKE 1 TABLET EVERY DAY  AT  6PM Qty: 90 tablet, Refills: 3    predniSONE (DELTASONE) 10 MG tablet Take 10 mg by mouth 2 (two) times daily as needed for pain. Pt takes as needed for gout flare up    ranitidine (ZANTAC) 150 MG capsule Take 150 mg by mouth 2 (two) times daily.    triamterene-hydrochlorothiazide (DYAZIDE) 37.5-25 MG capsule Take 1 capsule by mouth daily.    [name] 40 MG tablet Take 20 mg by mouth daily as needed (gout flare up).        Allergies  Allergen Reactions  . Bee Venom Anaphylaxis  . Codeine Nausea And Vomiting  . Shrimp [Shellfish Allergy] Swelling  . Tomato     Pt states it gives her gout   Follow-up Information    [name] Low, MD. Schedule an appointment as soon as possible for a visit in 1 week(s).   Specialty:  Internal Medicine Why:  Hospital follow up Contact information: [address] [place] [address] [phone]        [name] Casino, MD. Schedule an appointment as soon as possible for a visit in 1 week(s).   Specialty:  Cardiology Why:   Atrial fibrillation Contact information: [place] [address] [phone]            The results of significant diagnostics from this hospitalization (including imaging, microbiology, ancillary and laboratory) are listed below for reference.    Significant Diagnostic Studies: Dg Chest 2 View  Result Date: 01/30/2017 CLINICAL DATA:  Shortness of breath and chest pain EXAM: CHEST  2 VIEW COMPARISON:  April 24, 2016 FINDINGS: There is scarring in the left mid lung. There is no edema or consolidation. Heart is mildly enlarged with pulmonary vascularity within normal limits. No adenopathy. Aorta is tortuous with atherosclerotic calcification in the aorta. Patient is status post coronary artery bypass grafting. There is a left atrial appendage clamp. There is degenerative change in the thoracic spine. IMPRESSION: Scarring left mid lung. No edema or consolidation. Stable cardiac prominence. Aorta is tortuous with aortic atherosclerosis. Postoperative change, stable. Electronically Signed   By: Lowella Grip III M.D.   On: 01/30/2017 19:49    Microbiology: Recent Results (from the past 240 hour(s))  MRSA PCR Screening     Status: None   Collection Time: 01/31/17 12:08 AM  Result Value Ref Range Status   MRSA by PCR NEGATIVE NEGATIVE Final    Comment:  The GeneXpert MRSA Assay (FDA approved for NASAL specimens only), is one component of a comprehensive MRSA colonization surveillance program. It is not intended to diagnose MRSA infection nor to guide or monitor treatment for MRSA infections.      Labs: Basic Metabolic Panel:  Recent Labs Lab 01/30/17 1957 01/31/17 0008 01/31/17 0539  NA 141  --  141  K 3.4*  --  3.6  CL 108  --  110  CO2 23  --  21*  GLUCOSE 115*  --  110*  BUN 26*  --  24*  CREATININE 1.20*  --  1.04*  CALCIUM 9.3  --  8.8*  MG  --  1.9  --    Liver Function Tests: No results for input(s): AST, ALT,  ALKPHOS, BILITOT, PROT, ALBUMIN in the last 168 hours. No results for input(s): LIPASE, AMYLASE in the last 168 hours. No results for input(s): AMMONIA in the last 168 hours. CBC:  Recent Labs Lab 01/30/17 1957 01/31/17 0539  WBC 12.1* 9.7  HGB 15.9* 15.1*  HCT 45.4 44.0  MCV 85.8 86.3  PLT 171 162   Cardiac Enzymes:  Recent Labs Lab 01/31/17 0008 01/31/17 0539  TROPONINI 0.03* <0.03   BNP: BNP (last 3 results)  Recent Labs  02/19/16 2047  BNP 33.1    ProBNP (last 3 results) No results for input(s): PROBNP in the last 8760 hours.  CBG: No results for input(s): GLUCAP in the last 168 hours.     SignedCristal Ford  [facility] 01/31/2017, 2:30 PM

## 2017-01-31 NOTE — Consult Note (Addendum)
Cardiology Consultation:   Patient ID: [name] [name]; [id number]; 1946-08-06   Admit date: 01/30/2017 Date of Consult: 01/31/2017  Primary Care Provider: [name], MD Primary Cardiologist: Dr. Debara Pickett Primary Electrophysiologist:  None   Patient Profile:   [name] [name] is a 71 y.o. female with a hx of atrial fibrillation, CAD s/p arrest with CABG in 2016 LIMA to LAD and SVG to OM, CKD III, and gout hypertension,  who is being seen today for the evaluation of atrial fibrillation with RVR at the request of Dr. Ree Kida.  History of Present Illness:   [name] [name] has an complex cardiac past medical history. In 2016  She was found to be in atrial fibrillation with RVR with accompanying fatigue, chest tightness and URI symptoms. She had a complicated admission with cardiac arrest during cardiac catheterization which she ultimately recovered from. She had CABG LIMA to LAD, SVG to OM. She continued to have problems with her atrial fibrillation RVR, and was treated with Amiodarone and Lopressor but did not convert to NSR. She then underwent an unsuccessful cardioversion but ultimately spontaneously converted to NSR on her own. She was started on Eliquis for stroke prevention.  She most recently saw Dr. Debara Pickett in the office 12/26/2016, her most recent EF is 50-55% (05/2016) and had been having some SOB and her BP has been elevated with questionable medical compliance. Dr. Debara Pickett discussed her Paroxysmal atrial fibrillation CHADVASC 4 (age, hypertension, gender, vascular dz)-- she is not on anticoagulation because of s/p atrial closure.   She presented to the ER because of intermittent palpitations and SOB over the past few weeks, she had an episode yesterday that persisted longer then the others and did not resolve then she developed associated progressive SOB with exertion and vague associated chest pressure. Her heart rate was found to be 160 initial and in atrial fibrillation. She had normal  troponin, negative chest xray, no ischemia seen on EKG. Cardiology was consulted and a Diltiazem drip was recommended as well as heparin since she is has not been anticoagulated.  This morning the patient is feeling much better. She converted to NSR on her dilt gtt. She became bradycardic in the 50s and so the nurse stopped her Dilt around 9:20 am. Pt doing well.  Past Medical History:  Diagnosis Date  . Acute on chronic combined systolic and diastolic HF (heart failure) ([place]) 10/30/2014   EF 35-40% with RWMA   . Arthritis   . Asthma   . Atrial fibrillation with RVR ([place])   . Cardiomyopathy, ischemic   . Hypertension   . Left main coronary artery disease 11/04/2014  . S/P CABG x 2 with clipping of LA appendage 11/14/2014   LIMA to LAD, SVG to OM, EVH via right thigh    Past Surgical History:  Procedure Laterality Date  . CARDIOVERSION N/A 11/18/2014   Procedure: CARDIOVERSION;  Surgeon: [name], MD;  Location: [facility];  Service: Cardiovascular;  Laterality: N/A;  . CLIPPING OF ATRIAL APPENDAGE N/A 11/14/2014   Procedure: CLIPPING OF ATRIAL APPENDAGE;  Surgeon: [name], MD;  Location: [facility];  Service: Open Heart Surgery;  Laterality: N/A;  . CORONARY ARTERY BYPASS GRAFT N/A 11/14/2014   Procedure: CORONARY ARTERY BYPASS GRAFTING (CABG)TIMES 2 USING LEFT INTERNAL MAMMARY ARTERY AND RIGHT SAPHENOUS VEIN HARVESTED ENDOSCOPICALLY;  Surgeon: [name], MD;  Location: [facility];  Service: Open Heart Surgery;  Laterality: N/A;  . LEFT HEART CATHETERIZATION WITH CORONARY ANGIOGRAM N/A 11/04/2014  Procedure: LEFT HEART CATHETERIZATION WITH CORONARY ANGIOGRAM;  Surgeon: Troy Sine, MD;  Location: [facility] CATH LAB;  Service: Cardiovascular;  Laterality: N/A;  . TEE WITHOUT CARDIOVERSION N/A 11/14/2014   Procedure: TRANSESOPHAGEAL ECHOCARDIOGRAM (TEE);  Surgeon: [name], MD;  Location: [facility];  Service: Open Heart Surgery;  Laterality: N/A;  . TEMPORARY PACEMAKER INSERTION  11/04/2014    Procedure: TEMPORARY PACEMAKER INSERTION;  Surgeon: Troy Sine, MD;  Location: [facility] CATH LAB;  Service: Cardiovascular;;     Inpatient Medications: Scheduled Meds: . aspirin EC  81 mg Oral Daily  . atorvastatin  10 mg Oral q1800  . carvedilol  6.25 mg Oral BID  . enoxaparin (LOVENOX) injection  40 mg Subcutaneous Q24H  . famotidine  10 mg Oral BID  . sodium chloride flush  3 mL Intravenous Q12H   Continuous Infusions: . diltiazem (CARDIZEM) infusion Stopped (01/31/17 0921)   PRN Meds: acetaminophen **OR** acetaminophen  Allergies:    Allergies  Allergen Reactions  . Bee Venom Anaphylaxis  . Codeine Nausea And Vomiting  . Shrimp [Shellfish Allergy] Swelling  . Tomato     Pt states it gives her gout    Social History:   Social History   Social History  . Marital status: Widowed    Spouse name: N/A  . Number of children: N/A  . Years of education: N/A   Occupational History  . Not on file.   Social History Main Topics  . Smoking status: Current Some Day Smoker    Packs/day: 1.00    Years: 32.00    Types: Cigarettes  . Smokeless tobacco: Never Used     Comment: occasional puff here and there - 02/09/15  . Alcohol use No  . Drug use: No  . Sexual activity: Not on file   Other Topics Concern  . Not on file   Social History Narrative  . No narrative on file    Family History:   [name] family history includes Cancer in her mother; Heart disease in her father.  ROS:  Please see the history of present illness.  All other ROS reviewed and negative.     Physical Exam/Data:   Vitals:   01/31/17 0745 01/31/17 0800 01/31/17 0900 01/31/17 0920  BP:   (!) 132/99   Pulse: 93     Resp: 16  (!) 21 17  Temp:  98.5 F (36.9 C)    TempSrc:  Oral    SpO2: 100%     Weight:      Height:        Intake/Output Summary (Last 24 hours) at 01/31/17 1034 Last data filed at 01/31/17 0948  Gross per 24 hour  Intake               48 ml  Output              250  ml  Net             -202 ml   Filed Weights   01/30/17 1928 01/30/17 2008 01/30/17 2348  Weight: 220 lb (99.8 kg) 220 lb (99.8 kg) 217 lb 2.5 oz (98.5 kg)   Body mass index is 34.01 kg/m.  General: Well developed, well nourished, in no acute distress. Head: Normocephalic, atraumatic, sclera non-icteric, no xanthomas, nares are without discharge.  Neck: Negative for carotid bruits. JVD not elevated. Lungs: Clear bilaterally to auscultation without wheezes, rales, or rhonchi. Breathing is unlabored. Heart: RRR with S1 S2. No murmurs,  rubs, or gallops appreciated. Abdomen: Soft, non-tender, non-distended with normoactive bowel sounds. No hepatomegaly. No rebound/guarding. No obvious abdominal masses. Msk:  Strength and tone appear normal for age. Extremities: No clubbing or cyanosis. No edema.  Distal pedal pulses are 2+ and equal bilaterally. Neuro: Alert and oriented X 3. No facial asymmetry. No focal deficit. Moves all extremities spontaneously. Psych:  Responds to questions appropriately with a normal affect.  EKG:  The EKG was personally reviewed and demonstrates HR 153, atrial fibrillation with rapid ventricular rate. Relevant CV Studies: Limited 2D echo (01/11/2017)  Study Conclusions  - Left ventricle: Posterior lateral hypokinesis. The cavity size   was normal. Systolic function was normal. The estimated ejection   fraction was in the range of 50% to 55%. Left ventricular   diastolic function parameters were normal. - Aortic valve: There was mild regurgitation. - Mitral valve: There was mild regurgitation. - Left atrium: The atrium was moderately dilated. - Atrial septum: No defect or patent foramen ovale was identified.   Laboratory Data:  Chemistry Recent Labs Lab 01/30/17 1957 01/31/17 0539  NA 141 141  K 3.4* 3.6  CL 108 110  CO2 23 21*  GLUCOSE 115* 110*  BUN 26* 24*  CREATININE 1.20* 1.04*  CALCIUM 9.3 8.8*  GFRNONAA 44* 52*  GFRAA 51* >60  ANIONGAP  10 10    No results for input(s): PROT, ALBUMIN, AST, ALT, ALKPHOS, BILITOT in the last 168 hours. Hematology Recent Labs Lab 01/30/17 1957 01/31/17 0539  WBC 12.1* 9.7  RBC 5.29* 5.10  HGB 15.9* 15.1*  HCT 45.4 44.0  MCV 85.8 86.3  MCH 30.1 29.6  MCHC 35.0 34.3  RDW 15.1 15.1  PLT 171 162   Cardiac Enzymes Recent Labs Lab 01/31/17 0008 01/31/17 0539  TROPONINI 0.03* <0.03    Recent Labs Lab 01/30/17 2007  TROPIPOC 0.03    BNPNo results for input(s): BNP, PROBNP in the last 168 hours.  DDimer No results for input(s): DDIMER in the last 168 hours.  Radiology/Studies:  Dg Chest 2 View  Result Date: 01/30/2017 CLINICAL DATA:  Shortness of breath and chest pain EXAM: CHEST  2 VIEW COMPARISON:  April 24, 2016 FINDINGS: There is scarring in the left mid lung. There is no edema or consolidation. Heart is mildly enlarged with pulmonary vascularity within normal limits. No adenopathy. Aorta is tortuous with atherosclerotic calcification in the aorta. Patient is status post coronary artery bypass grafting. There is a left atrial appendage clamp. There is degenerative change in the thoracic spine. IMPRESSION: Scarring left mid lung. No edema or consolidation. Stable cardiac prominence. Aorta is tortuous with aortic atherosclerosis. Postoperative change, stable. Electronically Signed   By: Lowella Grip III M.D.   On: 01/30/2017 19:49    Assessment and Plan:   1. Atrial fibrillation with RVR: Currently in sinus rhythm   HR to 150-160s on arrival CHADVASC 4 (age, hypertension, gender, vascular dz). Converted to sinus on Diltizem gtt overnight which has been discontinued this morning because of bradycardia rate of the 50s. She is on Heparin in the hospital. Will need PO anticoagulation for home. Carvedilol was increased to 12.5 mg BID last month, doesn't appear patient has started this change. Will hold BB until HR improves. Currently 50. If she goes back into atrial fibrillation  again will need to avoid cardizem.  2. CAD s/p arrest with CABG in 2016 LIMA to LAD and SVG to OM: No chest pain, neg troponins no ischemic changes on EKG. Last EF  12/2016  50-55%  3. Hypertension: Poorly controlled on home regimen but better after morning dose of medications. Now 132/99.  4. Hyperlipidemia: last checked 2016, 60. Continue statin. Consider rechecking as outpatient.  Kristopher Glee, PA-C  01/31/2017 10:34 AM   Personally seen and examined. Agree with above.  Feels better. Converted.  Alert, Brady RR, CTAB, no edema  PAF Now tele, personally reviewed, shows SB 58. Had post conversion brady/pause (1.5 to 2 sec).  OK to continue coreg 6.25 BID (she was not taking) Discussed with Dr. Debara Pickett. OK with her not being on anticoagulation because of LAA closure during surgery.   Atypical CP  - during RVR, now resolved  CAD  - stable post CABG  Medical non compliance  - continue to encourage  OK with DC from our perspective. Will sign off.  Will maintain follow up with Dr. Debara Pickett and Dr. Lysle Rubens.   [name], MD

## 2017-01-31 NOTE — Care Management Note (Signed)
Case Management Note  Patient Details  Name: [name] [name] MRN: [id number] Date of Birth: 11-12-1945  Subjective/Objective:                   71 y.o. female with a past medical history significant for Afib, CAD s/p arrest with CABG in 2016 and LAA ligation, CKD III, and gout who presents with palpitations.   Action/Plan: Date:  January 31, 2017 Chart reviewed for concurrent status and case management needs. Will continue to follow patient progress. Discharge Planning: following for needs Expected discharge date: [id number] [name], BSN, [place], [employer]  Expected Discharge Date:   (unknown)               Expected Discharge Plan:  Home/Self Care  In-House Referral:     Discharge planning Services  CM Consult  Post Acute Care Choice:    Choice offered to:     DME Arranged:    DME Agency:     HH Arranged:    [place] Agency:     Status of Service:  In process, will continue to follow  If discussed at Long Length of Stay Meetings, dates discussed:    Additional Comments:  [name], [name], RN 01/31/2017, 8:47 AM

## 2017-01-31 NOTE — Discharge Instructions (Signed)
Atrial Fibrillation Atrial fibrillation is a type of irregular or rapid heartbeat (arrhythmia). In atrial fibrillation, the heart quivers continuously in a chaotic pattern. This occurs when parts of the heart receive disorganized signals that make the heart unable to pump blood normally. This can increase the risk for stroke, heart failure, and other heart-related conditions. There are different types of atrial fibrillation, including:  Paroxysmal atrial fibrillation. This type starts suddenly, and it usually stops on its own shortly after it starts.  Persistent atrial fibrillation. This type often lasts longer than a week. It may stop on its own or with treatment.  Long-lasting persistent atrial fibrillation. This type lasts longer than 12 months.  Permanent atrial fibrillation. This type does not go away.  Talk with your health care provider to learn about the type of atrial fibrillation that you have. What are the causes? This condition is caused by some heart-related conditions or procedures, including:  A heart attack.  Coronary artery disease.  Heart failure.  Heart valve conditions.  High blood pressure.  Inflammation of the sac that surrounds the heart (pericarditis).  Heart surgery.  Certain heart rhythm disorders, such as Wolf-Parkinson-White syndrome.  Other causes include:  Pneumonia.  Obstructive sleep apnea.  Blockage of an artery in the lungs (pulmonary embolism, or PE).  Lung cancer.  Chronic lung disease.  Thyroid problems, especially if the thyroid is overactive (hyperthyroidism).  Caffeine.  Excessive alcohol use or illegal drug use.  Use of some medicines, including certain decongestants and diet pills.  Sometimes, the cause cannot be found. What increases the risk? This condition is more likely to develop in:  People who are older in age.  People who smoke.  People who have diabetes mellitus.  People who are overweight  (obese).  Athletes who exercise vigorously.  What are the signs or symptoms? Symptoms of this condition include:  A feeling that your heart is beating rapidly or irregularly.  A feeling of discomfort or pain in your chest.  Shortness of breath.  Sudden light-headedness or weakness.  Getting tired easily during exercise.  In some cases, there are no symptoms. How is this diagnosed? Your health care provider may be able to detect atrial fibrillation when taking your pulse. If detected, this condition may be diagnosed with:  An electrocardiogram (ECG).  A Holter monitor test that records your heartbeat patterns over a 24-hour period.  Transthoracic echocardiogram (TTE) to evaluate how blood flows through your heart.  Transesophageal echocardiogram (TEE) to view more detailed images of your heart.  A stress test.  Imaging tests, such as a CT scan or chest X-ray.  Blood tests.  How is this treated? The main goals of treatment are to prevent blood clots from forming and to keep your heart beating at a normal rate and rhythm. The type of treatment that you receive depends on many factors, such as your underlying medical conditions and how you feel when you are experiencing atrial fibrillation. This condition may be treated with:  Medicine to slow down the heart rate, bring the heart's rhythm back to normal, or prevent clots from forming.  Electrical cardioversion. This is a procedure that resets your heart's rhythm by delivering a controlled, low-energy shock to the heart through your skin.  Different types of ablation, such as catheter ablation, catheter ablation with pacemaker, or surgical ablation. These procedures destroy the heart tissues that send abnormal signals. When the pacemaker is used, it is placed under your skin to help your heart beat in   a regular rhythm.  Follow these instructions at home:  Take over-the counter and prescription medicines only as told by your  health care provider.  If your health care provider prescribed a blood-thinning medicine (anticoagulant), take it exactly as told. Taking too much blood-thinning medicine can cause bleeding. If you do not take enough blood-thinning medicine, you will not have the protection that you need against stroke and other problems.  Do not use tobacco products, including cigarettes, chewing tobacco, and e-cigarettes. If you need help quitting, ask your health care provider.  If you have obstructive sleep apnea, manage your condition as told by your health care provider.  Do not drink alcohol.  Do not drink beverages that contain caffeine, such as coffee, soda, and tea.  Maintain a healthy weight. Do not use diet pills unless your health care provider approves. Diet pills may make heart problems worse.  Follow diet instructions as told by your health care provider.  Exercise regularly as told by your health care provider.  Keep all follow-up visits as told by your health care provider. This is important. How is this prevented?  Avoid drinking beverages that contain caffeine or alcohol.  Avoid certain medicines, especially medicines that are used for breathing problems.  Avoid certain herbs and herbal medicines, such as those that contain ephedra or ginseng.  Do not use illegal drugs, such as cocaine and amphetamines.  Do not smoke.  Manage your high blood pressure. Contact a health care provider if:  You notice a change in the rate, rhythm, or strength of your heartbeat.  You are taking an anticoagulant and you notice increased bruising.  You tire more easily when you exercise or exert yourself. Get help right away if:  You have chest pain, abdominal pain, sweating, or weakness.  You feel nauseous.  You notice blood in your vomit, bowel movement, or urine.  You have shortness of breath.  You suddenly have swollen feet and ankles.  You feel dizzy.  You have sudden weakness or  numbness of the face, arm, or leg, especially on one side of the body.  You have trouble speaking, trouble understanding, or both (aphasia).  Your face or your eyelid droops on one side. These symptoms may represent a serious problem that is an emergency. Do not wait to see if the symptoms will go away. Get medical help right away. Call your local emergency services (911 in the [place] Do not drive yourself to the hospital. This information is not intended to replace advice given to you by your health care provider. Make sure you discuss any questions you have with your health care provider. Document Released: 08/01/2005 Document Revised: 12/09/2015 Document Reviewed: 11/26/2014 Elsevier Interactive Patient Education  2017 [employer]  

## 2017-01-31 NOTE — Progress Notes (Signed)
Date: January 31, 2017 Chart reviewed for discharge orders: None found for case management. Vernia Buff, [phone]

## 2017-01-31 NOTE — H&P (Signed)
History and Physical  Patient Name: [name] [name]     GQQ:[id number]    DOB: 20-Nov-1945    DOA: 01/30/2017 PCP: [name], MD  Patient coming from: Home  Chief Complaint: Chest discomfort, palpitations      HPI: [name] [name] is a 71 y.o. female with a past medical history significant for Afib, CAD s/p arrest with CABG in 2016 and LAA ligation, CKD III, and gout who presents with palpitations.  The patient was in her usual state of health until the last few weeks and she's had intermittent episodes of palpitations and shortness of breath.  Then yesterday, the sensation started again, did not resolve by itself, began to be associated with progressive shortness of breath with any exertion and vague chest discomfort so she came to the emergency room.  She has been taking her carvedilol daily, although she is very vague about what she takes and there are discrepancies between her medication reconciliation with me and the Pharm tech.    ED course: -Afebrile, heart rate initially 160, blood pressure 115/94, respirations 21, pulse oximetry normal on room air -Na 141, K 3.4, Cr 1.2 (baseline 1.2), WBC 12.1K, Hgb 15.9 -Coags normal -Initial troponin negative -Chest x-ray cleared -ECG showed atrial fibrillation rate 153, possibly some lateral ST depressions -The case was discussed with cardiology who recommended diltiazem drip and observation overnight, possibly heparin -TRH were asked to evaluate for A. fib with RVR  The patient has paroxysmal atrial fibrillation, failed cardioversion some years ago, was on amiodarone for a time, no longer.    At the time of her cardiac arrest and subsequent CABG she had her LAA occluded.  Was on Eliquis briefly, but her cardiologist has had her off of it since then.          ROS: Review of Systems  Constitutional: Negative for fever.  Respiratory: Positive for shortness of breath.   Cardiovascular: Positive for chest pain and palpitations.  All  other systems reviewed and are negative.         Past Medical History:  Diagnosis Date  . Acute on chronic combined systolic and diastolic HF (heart failure) ([place]) 10/30/2014   EF 35-40% with RWMA   . Arthritis   . Asthma   . Atrial fibrillation with RVR ([place])   . Cardiomyopathy, ischemic   . Hypertension   . Left main coronary artery disease 11/04/2014  . S/P CABG x 2 with clipping of LA appendage 11/14/2014   LIMA to LAD, SVG to OM, EVH via right thigh    Past Surgical History:  Procedure Laterality Date  . CARDIOVERSION N/A 11/18/2014   Procedure: CARDIOVERSION;  Surgeon: [name], MD;  Location: [place];  Service: Cardiovascular;  Laterality: N/A;  . CLIPPING OF ATRIAL APPENDAGE N/A 11/14/2014   Procedure: CLIPPING OF ATRIAL APPENDAGE;  Surgeon: [name], MD;  Location: [place];  Service: Open Heart Surgery;  Laterality: N/A;  . CORONARY ARTERY BYPASS GRAFT N/A 11/14/2014   Procedure: CORONARY ARTERY BYPASS GRAFTING (CABG)TIMES 2 USING LEFT INTERNAL MAMMARY ARTERY AND RIGHT SAPHENOUS VEIN HARVESTED ENDOSCOPICALLY;  Surgeon: [name], MD;  Location: [place];  Service: Open Heart Surgery;  Laterality: N/A;  . LEFT HEART CATHETERIZATION WITH CORONARY ANGIOGRAM N/A 11/04/2014   Procedure: LEFT HEART CATHETERIZATION WITH CORONARY ANGIOGRAM;  Surgeon: Troy Sine, MD;  Location: [facility] CATH LAB;  Service: Cardiovascular;  Laterality: N/A;  . TEE WITHOUT CARDIOVERSION N/A 11/14/2014   Procedure: TRANSESOPHAGEAL ECHOCARDIOGRAM (TEE);  Surgeon: [name], MD;  Location: [place];  Service: Open Heart Surgery;  Laterality: N/A;  . TEMPORARY PACEMAKER INSERTION  11/04/2014   Procedure: TEMPORARY PACEMAKER INSERTION;  Surgeon: Troy Sine, MD;  Location: [facility] CATH LAB;  Service: Cardiovascular;;    Social History: Patient lives alone.  The patient walks unassisted.  Nonsmoker.  Allergies  Allergen Reactions  . Bee Venom Anaphylaxis  . Codeine Nausea And Vomiting  . Shrimp  [Shellfish Allergy] Swelling  . Tomato     Pt states it gives her gout    Family history: family history includes Cancer in her mother; Heart disease in her father.  Prior to Admission medications   Medication Sig Start Date End Date Taking? Authorizing Provider  acetaminophen (TYLENOL) 500 MG tablet Take 500 mg by mouth every 6 (six) hours as needed for moderate pain.    Yes [provider]  aspirin EC 81 MG EC tablet Take 1 tablet (81 mg total) by mouth daily. 11/23/14  Yes Lars Pinks M, PA-C  atorvastatin (LIPITOR) 10 MG tablet TAKE 1 TABLET EVERY DAY  AT  6PM 06/06/16  Yes Hilty, Nadean Corwin, MD  carvedilol (COREG) 12.5 MG tablet Take 1 tablet (12.5 mg total) by mouth 2 (two) times daily. Patient taking differently: Take 6.25 mg by mouth 2 (two) times daily.  12/26/16  Yes Hilty, Nadean Corwin, MD  predniSONE (DELTASONE) 10 MG tablet Take 10 mg by mouth 2 (two) times daily as needed for pain. Pt takes as needed for gout flare up 01/02/17  Yes [provider]  ranitidine (ZANTAC) 150 MG capsule Take 150 mg by mouth 2 (two) times daily.   Yes [provider]  triamterene-hydrochlorothiazide (DYAZIDE) 37.5-25 MG capsule Take 1 capsule by mouth daily. 08/12/15  Yes [provider]  [name] 40 MG tablet Take 20 mg by mouth daily as needed (gout flare up).  01/08/16  Yes [provider]       Physical Exam: BP (!) 166/100   Pulse 86   Temp 97.5 F (36.4 C) (Oral)   Resp 13   Ht 5\' 7"  (1.702 m)   Wt 98.5 kg (217 lb 2.5 oz)   SpO2 98%   BMI 34.01 kg/m  General appearance: Well-developed, elderly adult female, alert and in no acute distress.   Eyes: Anicteric, conjunctiva pink, lids and lashes normal. PERRL.    ENT: No nasal deformity, discharge, epistaxis.  Hearing normal. OP moist without lesions.   Neck: No neck masses.  Trachea midline.  No thyromegaly/tenderness. Lymph: No cervical or supraclavicular lymphadenopathy. Skin: Warm and  dry.   No suspicious rashes or lesions. Cardiac: Tachycardic, irregular, pulse rises with minimal exertion, nl S1-S2, no murmurs appreciated.  Capillary refill is brisk.  JVP normal.  No LE edema.  Radial pulses 2+ and symmetric. Respiratory: Normal respiratory rate and rhythm.  CTAB without rales or wheezes. Abdomen: Abdomen soft.  No TTP. No ascites, distension, hepatosplenomegaly.   MSK: No deformities or effusions.  No cyanosis or clubbing. Neuro: Cranial nerves 3-12 intact.  Sensation intact to light touch. Speech is fluent.  Muscle strength normal.    Psych: Sensorium intact and responding to questions, attention normal.  Behavior appropriate.  Affect normal.  Judgment and insight appear normal.     Labs on Admission:  I have personally reviewed following labs and imaging studies: CBC:  Recent Labs Lab 01/30/17 1957  WBC 12.1*  HGB 15.9*  HCT 45.4  MCV 85.8  PLT  226   Basic Metabolic Panel:  Recent Labs Lab 01/30/17 1957 01/31/17 0008  NA 141  --   K 3.4*  --   CL 108  --   CO2 23  --   GLUCOSE 115*  --   BUN 26*  --   CREATININE 1.20*  --   CALCIUM 9.3  --   MG  --  1.9   GFR: Estimated Creatinine Clearance: 51.1 mL/min (A) (by C-G formula based on SCr of 1.2 mg/dL (H)).  Coagulation Profile:  Recent Labs Lab 01/30/17 1957  INR 1.01   Cardiac Enzymes:  Recent Labs Lab 01/31/17 0008  TROPONINI 0.03*        Radiological Exams on Admission: Personally reviewed CXR shows no focal opacity other than chronic presumed fibrotic change: Dg Chest 2 View  Result Date: 01/30/2017 CLINICAL DATA:  Shortness of breath and chest pain EXAM: CHEST  2 VIEW COMPARISON:  April 24, 2016 FINDINGS: There is scarring in the left mid lung. There is no edema or consolidation. Heart is mildly enlarged with pulmonary vascularity within normal limits. No adenopathy. Aorta is tortuous with atherosclerotic calcification in the aorta. Patient is status post coronary artery  bypass grafting. There is a left atrial appendage clamp. There is degenerative change in the thoracic spine. IMPRESSION: Scarring left mid lung. No edema or consolidation. Stable cardiac prominence. Aorta is tortuous with aortic atherosclerosis. Postoperative change, stable. Electronically Signed   By: Lowella Grip III M.D.   On: 01/30/2017 19:49    EKG: Independently reviewed. Rate 153, a. Fib.          Assessment/Plan Principal Problem:   Atrial fibrillation with RVR (HCC) Active Problems:   Hypokalemia   Chronic combined systolic and diastolic CHF (congestive heart failure) (HCC)   Essential hypertension   Coronary artery disease due to lipid rich plaque   CKD (chronic kidney disease), stage III  1. Atrial fibrillation:  CHADS2-VASc 4, not on anticoagulation because of elevated a closure. -Continue diltiazem -Consult to cardiology -Trend troponins overnight -Keep K >4, mag >2   -Continue carvedilol   2. Hypertension and CAD secondary prevention:  Normotensive at admission -Continue aspirin and statin -Continue triamterene and HCTZ -Continue Coreg  3. Chronic kidney disease stage III:  Stable  4. Other medications:  -Continue Pepcid -Continue [name] PRN  5. Hypokalemia:  Mild -Supplement orally -Check mag            DVT prophylaxis: Lovenox  Code Status: FULL  Family Communication: None present  Disposition Plan: Anticipate IV diltiazem and likely transition to oral regimen per Cardiology tomorrow then discharge Consults called: Cardiology Admission status: OBS At the point of initial evaluation, it is my clinical opinion that admission for OBSERVATION is reasonable and necessary because the patient's presenting complaints in the context of their chronic conditions represent sufficient risk of deterioration or significant morbidity to constitute reasonable grounds for close observation in the hospital setting, but that the patient may be  medically stable for discharge from the hospital within 24 to 48 hours.    Medical decision making: Patient seen at 10:25 PM on 01/30/2017.  The patient was discussed with Dr. Laverta Baltimore.  What exists of the patient's chart was reviewed in depth and summarized above.  Clinical condition: stable.        Edwin Dada [facility] Pager [phone]

## 2017-01-31 NOTE — Progress Notes (Signed)
PROGRESS NOTE    [name] [name]  LPF:[id number] DOB: May 28, 1946 DOA: 01/30/2017 PCP: [name], MD   Chief Complaint  Patient presents with  . Shortness of Breath  . Tachycardia    Brief Narrative:  HPI on 01/30/2017 by Dr. Myrene Buddy [name] [name] is a 71 y.o. female with a past medical history significant for Afib, CAD s/p arrest with CABG in 2016 and LAA ligation, CKD III, and gout who presents with palpitations. The patient was in her usual state of health until the last few weeks and she's had intermittent episodes of palpitations and shortness of breath.  Then yesterday, the sensation started again, did not resolve by itself, began to be associated with progressive shortness of breath with any exertion and vague chest discomfort so she came to the emergency room.  She has been taking her carvedilol daily, although she is very vague about what she takes and there are discrepancies between her medication reconciliation with me and the Pharm tech.   Assessment & Plan   Atrial fibrillation, Paroxysmal -CHADSVASC 4, patient not on anticoagulation states she takes aspirin -Patient required diltiazem drip upon admission, has been weaned off as patient now is bradycardic heart rates in the 50 -Patient also on Coreg, per cardiology, Dr. Debara Pickett, progress note from 12/26/2016, Coregto be 12.5 twice daily -Cardiology consulted and appreciated -TSH 1.142, magnesium 1.9 -Potassium 3.6, continue to supplement for a goal of 4  Essential hypertension -Continue Coreg  Coronary artery disease -Status post CABG 2 -Continue Coreg, aspirin  Chronic systolic congestive heart failure -Last echocardiogram 01/11/2017: EF 53-29%, Diastolic function parameters were normal -EF was 35-40% in 2016 -Currently appears to be euvolemic  -Monitor intake/output, daily weights -Diuretics currently held  Chronic kidney disease, Stage III -Creatinine currently stable, continue to monitor  BMP  GERD -Continue H2 blocker  Hypokalemia -Replacing continue to monitor BMP, goal for  Hyperlipidemia -Continue statin  DVT Prophylaxis  Lovenox  Code Status: Full  Family Communication: None at bedside  Disposition Plan: Currently in stepdown given Diltiazem drip and Afib. Dispo home  Consultants Cardiology  Procedures  None  Antibiotics   Anti-infectives    None      Subjective:   [name] [name] seen and examined today.  Patient states that her flutter feels better. She states that historically she can use honey and her fluttering goes away. She denies any current chest pain, shortness breath, abdominal pain, nausea or vomiting, diarrhea or constipation. Patient states she liked to go home to take care of her cat.    Objective:   Vitals:   01/31/17 0800 01/31/17 0900 01/31/17 0920 01/31/17 1100  BP:  (!) 132/99  (!) 164/66  Pulse:    (!) 56  Resp:  (!) 21 17 18   Temp: 98.5 F (36.9 C)     TempSrc: Oral     SpO2:    99%  Weight:      Height:        Intake/Output Summary (Last 24 hours) at 01/31/17 1250 Last data filed at 01/31/17 1100  Gross per 24 hour  Intake               48 ml  Output              251 ml  Net             -203 ml   Filed Weights   01/30/17 1928 01/30/17 2008 01/30/17 2348  Weight: 99.8 kg (220 lb)  99.8 kg (220 lb) 98.5 kg (217 lb 2.5 oz)    Exam  General: Well developed, well nourished, NAD, appears stated age  11: NCAT,  mucous membranes moist.   Cardiovascular: S1 S2 auscultated, irregular, no murmur  Respiratory: Clear to auscultation bilaterally with equal chest rise  Abdomen: Soft, obese, nontender, nondistended, + bowel sounds  Extremities: warm dry without cyanosis clubbing or edema  Neuro: AAOx3, nonfocal  Psych: Normal affect and demeanor with intact judgement and insight  Data Reviewed: I have personally reviewed following labs and imaging studies  CBC:  Recent Labs Lab 01/30/17 1957 01/31/17 0539   WBC 12.1* 9.7  HGB 15.9* 15.1*  HCT 45.4 44.0  MCV 85.8 86.3  PLT 171 235   Basic Metabolic Panel:  Recent Labs Lab 01/30/17 1957 01/31/17 0008 01/31/17 0539  NA 141  --  141  K 3.4*  --  3.6  CL 108  --  110  CO2 23  --  21*  GLUCOSE 115*  --  110*  BUN 26*  --  24*  CREATININE 1.20*  --  1.04*  CALCIUM 9.3  --  8.8*  MG  --  1.9  --    GFR: Estimated Creatinine Clearance: 59 mL/min (A) (by C-G formula based on SCr of 1.04 mg/dL (H)). Liver Function Tests: No results for input(s): AST, ALT, ALKPHOS, BILITOT, PROT, ALBUMIN in the last 168 hours. No results for input(s): LIPASE, AMYLASE in the last 168 hours. No results for input(s): AMMONIA in the last 168 hours. Coagulation Profile:  Recent Labs Lab 01/30/17 1957  INR 1.01   Cardiac Enzymes:  Recent Labs Lab 01/31/17 0008 01/31/17 0539  TROPONINI 0.03* <0.03   BNP (last 3 results) No results for input(s): PROBNP in the last 8760 hours. HbA1C: No results for input(s): HGBA1C in the last 72 hours. CBG: No results for input(s): GLUCAP in the last 168 hours. Lipid Profile: No results for input(s): CHOL, HDL, LDLCALC, TRIG, CHOLHDL, LDLDIRECT in the last 72 hours. Thyroid Function Tests:  Recent Labs  01/30/17 2000  TSH 1.142   Anemia Panel: No results for input(s): VITAMINB12, FOLATE, FERRITIN, TIBC, IRON, RETICCTPCT in the last 72 hours. Urine analysis:    Component Value Date/Time   COLORURINE YELLOW 04/24/2016 [place] 04/24/2016 1315   LABSPEC 1.025 04/24/2016 1315   PHURINE 5.5 04/24/2016 1315   GLUCOSEU NEGATIVE 04/24/2016 1315   HGBUR NEGATIVE 04/24/2016 1315   BILIRUBINUR NEGATIVE 04/24/2016 1315   BILIRUBINUR ng 12/25/2013 1342   KETONESUR NEGATIVE 04/24/2016 1315   PROTEINUR NEGATIVE 04/24/2016 1315   UROBILINOGEN 1.0 11/12/2014 0419   NITRITE NEGATIVE 04/24/2016 1315   LEUKOCYTESUR NEGATIVE 04/24/2016 1315   Sepsis  Labs: @LABRCNTIP (procalcitonin:4,lacticidven:4)  ) Recent Results (from the past 240 hour(s))  MRSA PCR Screening     Status: None   Collection Time: 01/31/17 12:08 AM  Result Value Ref Range Status   MRSA by PCR NEGATIVE NEGATIVE Final    Comment:        The GeneXpert MRSA Assay (FDA approved for NASAL specimens only), is one component of a comprehensive MRSA colonization surveillance program. It is not intended to diagnose MRSA infection nor to guide or monitor treatment for MRSA infections.       Radiology Studies: Dg Chest 2 View  Result Date: 01/30/2017 CLINICAL DATA:  Shortness of breath and chest pain EXAM: CHEST  2 VIEW COMPARISON:  April 24, 2016 FINDINGS: There is scarring in the left mid  lung. There is no edema or consolidation. Heart is mildly enlarged with pulmonary vascularity within normal limits. No adenopathy. Aorta is tortuous with atherosclerotic calcification in the aorta. Patient is status post coronary artery bypass grafting. There is a left atrial appendage clamp. There is degenerative change in the thoracic spine. IMPRESSION: Scarring left mid lung. No edema or consolidation. Stable cardiac prominence. Aorta is tortuous with aortic atherosclerosis. Postoperative change, stable. Electronically Signed   By: Lowella Grip III M.D.   On: 01/30/2017 19:49     Scheduled Meds: . aspirin EC  81 mg Oral Daily  . atorvastatin  10 mg Oral q1800  . carvedilol  6.25 mg Oral BID  . enoxaparin (LOVENOX) injection  40 mg Subcutaneous Q24H  . famotidine  10 mg Oral BID  . sodium chloride flush  3 mL Intravenous Q12H   Continuous Infusions: . diltiazem (CARDIZEM) infusion Stopped (01/31/17 0921)     LOS: 0 days   Time Spent in minutes   30 minutes  [name] [name] D.O. on 01/31/2017 at 12:50 PM  Between 7am to 7pm - Pager - [phone]  After 7pm go to [email or web address] - password [email or web address]  And look for the night coverage person covering for me after  hours  [facility] Office  [phone]

## 2017-02-08 ENCOUNTER — Ambulatory Visit (INDEPENDENT_AMBULATORY_CARE_PROVIDER_SITE_OTHER): Payer: PPO | Admitting: Internal Medicine

## 2017-02-08 ENCOUNTER — Other Ambulatory Visit: Payer: Self-pay

## 2017-02-08 ENCOUNTER — Encounter: Payer: Self-pay | Admitting: Internal Medicine

## 2017-02-08 VITALS — BP 149/86 | HR 69 | Ht 67.5 in | Wt 218.0 lb

## 2017-02-08 DIAGNOSIS — I10 Essential (primary) hypertension: Secondary | ICD-10-CM

## 2017-02-08 DIAGNOSIS — R1310 Dysphagia, unspecified: Secondary | ICD-10-CM | POA: Diagnosis not present

## 2017-02-08 DIAGNOSIS — I480 Paroxysmal atrial fibrillation: Secondary | ICD-10-CM | POA: Diagnosis not present

## 2017-02-08 DIAGNOSIS — Z951 Presence of aortocoronary bypass graft: Secondary | ICD-10-CM | POA: Diagnosis not present

## 2017-02-08 DIAGNOSIS — I255 Ischemic cardiomyopathy: Secondary | ICD-10-CM | POA: Diagnosis not present

## 2017-02-08 MED ORDER — DILTIAZEM HCL ER COATED BEADS 120 MG PO CP24: 120.0000 mg | ORAL_CAPSULE | Freq: Every day | ORAL | 11 refills | Status: DC

## 2017-02-08 MED ORDER — CARVEDILOL 6.25 MG PO TABS: 6.2500 mg | ORAL_TABLET | Freq: Two times a day (BID) | ORAL | 3 refills | Status: DC

## 2017-02-08 NOTE — Progress Notes (Signed)
OFFICE NOTE  Chief Complaint:  Follow-up, stomach bloating, dysphagia, recent a-fib with RVR  Primary Care Physician: [name], MD  HPI:  AUTYMN OMLOR is a pleasant 71 year old female who I recently saw the hospital when she was in the cardiac care unit. She has a long standing history of Hypertension and tobacco abuse. She was admitted to [facility] 10/29/2014 with a several week history of worsening symptoms of shortness of breath, chest tightness, fatigue, and URI symptoms. She was initially evaluated by her PCP at which time she was noted to be in Atrial Fibrillation with RVR. She was sent to the emergency department for evaluation. EKG confirmed Atrial Fibrillation, Troponin was negative. CXR was obtained and was concerning for pneumonia and she was started on empiric ABX. She also underwent TTE which showed a reduced EF of 35-40%. She had diffuse global hypokinesis especially of the inferior wall. It was felt the patient should undergo cardiac catheterization for further diagnostics, however patient became anxious and would not consent to the procedure. The patient calmed down and in a few days and further consultation she was agreeable to proceed with cardiac catheterization. This was to be done on 11/03/2014 however, the prior to the procedure the patient became agitated, combativeness and shortness of breath. She progressed to full cardiac arrest with asystole without any preceding signs of symptoms. She was successful resuscitated but showed signs of decerebrate posturing and was placed on the cooling protocol. During cooling she continued to have Atrial Fibrillation which progressed to bradycardia. It was again decided she should undergo cardiac catheterization. This was completed on 11/04/2014 and revealed significant coronary artery disease. It was felt coronary bypass grafting would be the treatment of choice and TCTS was consulted. She was evaluated by Dr.  Roxy Manns who agreed at some point she would benefit from Coronary Bypass grafting procedure. However at the time of evaluation the patient remained on a ventilator and was sedated without signs of neurologic recovery. It was felt medical therapy should be continued at this time and if patient awakes and returns to baseline surgery could be reconsidered at that point.The patient was treated medically for several days. She was successfully weaned and later extubated. She continued to have issues with Atrial Fibrillation. She was again evaluated by Dr. Roxy Manns on 11/11/2014 at which time he felt she was medically stable to proceed with Coronary Bypass grafting procedure. The risks and benefits of the procedure were explained to the patient and she was agreeable to proceed. She was taken to the operating room on 11/14/2014. She underwent CABG x 2 utilizing LIMA to LAD, SVG to OM. She also underwent clipping of her Left Atrial Appendage and endoscopic saphenous vein harvest from her right thigh. She tolerated the procedure well and was taken to the SICU in stable condition. The patient was extubated the evening of surgery. During her stay in the SICU she developed Rapid Atrial Fibrillation. She was treated with Amiodarone and Lopressor, but ultimately did not convert to NSR. EP consult was requested and it was felt cardioversion could be attempted. This was completed on POD # 4 and was unsuccessful. However, the patient ultimately converted to NSR on her own. She remains on Lopressor, which has been titrated as tolerated. She was also placed on Eliquis for stroke prevention.  She returns today for follow-up and is feeling quite well. She denies any chest pain or worsening shortness of breath. She is interesting in getting off of amiodarone completely. She  seems to be maintaining sinus rhythm at a rate of 55 with occasional PVCs.  Mrs. Bakula returns today for follow-up. She denies any chest pain but is  reporting some tenderness and heaviness in the left breast. She feels that it somewhat enlarged and feels heavier than the right side. It was before thought that her pain in the left chest may be related to her sternotomy however her symptoms are worsening. She denies any nipple discharge or skin changes in that area. She had been struggling with a Candida intertrigo which was successfully treated with topical medications. In addition she has some chronic back pain which I think is unrelated. Recently she saw a nurse practitioner and was placed on carvedilol as an alternative to her metoprolol. She seems to be tolerating this fairly well. Heart rate is now higher than it had been previously.  02/08/2016  Mrs. Lobb returns today for follow-up. At her last office visit she was complaining of some breast heaviness and pain and I ordered a mammogram which was negative for any malignancy or masses. She appears to have had resolution of her Candida intertrigo and I've indicated that she could use baby powder or a dry antiperspirant to help with sweating and itching. She continues to have some complaints of numbness and tingling the go down both arms which could be arising from the shoulders or neck. I will refer to orthopedics for this. In addition she reports some shortness of breath and weight gain which she thinks is related to medicines however it seems to be more related to appetite and decreased exercise. She is also back off on her smoking some but has not yet been able to quit.  05/19/2016  Mrs. Mcfate is seen today in follow-up. She has a number of unusual complaints today. Most recently however she was placed on a number of medications for blood pressure from her primary care provider which were different for the medications we have listed for her. She noted some side effects from these and says that she stopped taking them and resume taking the medications that she is currently on. Of note she has lost 7  pounds since her last office visit. She denies any worsening shortness of breath or chest pain. She is due for repeat echo for repeat assessment of her cardiomyopathy.  12/26/2016  Mrs. Goodroe was seen today for follow-up. She recently notify the office she wanted to switch cardiologist because she was looking for a female cardiologist that could help her with her hot flashes. She says she breaks out in a sweat for no reason. She's also been having problems with abdominal discomfort. Her ejection fraction actually has improved significantly recently by echo from 35-40% up to 50-55% in October 2017. Despite this, she reports she's been short of breath recently. Blood pressure remains elevated on concerned about the medication compliance.  02/08/2017  Mitsue returns for follow-up she had a repeat echo which was scheduled that showed stable LVEF of 50-55%. Unfortunately, she was briefly admitted for a-fib with RVR. She was started on diltiazem and converted overnight. Seen by [name] who spoke with me to confirm since she has had surgical closure of the LAA she does not require anticoagulation. She is on aspirin for CAD. She was not discharged on additional medication. Since she responded to CCB, I will start a low dose to help prevent recurrence. She is still concerned about dysphagia, particularly to solid foods and bloating. She takes ranitidine.  PMHx:  Past Medical  History:  Diagnosis Date  . Acute on chronic combined systolic and diastolic HF (heart failure) ([place]) 10/30/2014   EF 35-40% with RWMA   . Arthritis   . Asthma   . Atrial fibrillation with RVR ([place])   . Cardiomyopathy, ischemic   . Hypertension   . Left main coronary artery disease 11/04/2014  . S/P CABG x 2 with clipping of LA appendage 11/14/2014   LIMA to LAD, SVG to OM, EVH via right thigh    Past Surgical History:  Procedure Laterality Date  . CARDIOVERSION N/A 11/18/2014   Procedure: CARDIOVERSION;  Surgeon: [name],  MD;  Location: [place];  Service: Cardiovascular;  Laterality: N/A;  . CLIPPING OF ATRIAL APPENDAGE N/A 11/14/2014   Procedure: CLIPPING OF ATRIAL APPENDAGE;  Surgeon: [name], MD;  Location: [place];  Service: Open Heart Surgery;  Laterality: N/A;  . CORONARY ARTERY BYPASS GRAFT N/A 11/14/2014   Procedure: CORONARY ARTERY BYPASS GRAFTING (CABG)TIMES 2 USING LEFT INTERNAL MAMMARY ARTERY AND RIGHT SAPHENOUS VEIN HARVESTED ENDOSCOPICALLY;  Surgeon: [name], MD;  Location: [place];  Service: Open Heart Surgery;  Laterality: N/A;  . LEFT HEART CATHETERIZATION WITH CORONARY ANGIOGRAM N/A 11/04/2014   Procedure: LEFT HEART CATHETERIZATION WITH CORONARY ANGIOGRAM;  Surgeon: Troy Sine, MD;  Location[facility] LAB;  Service: Cardiovascular;  Laterality: N/A;  . TEE WITHOUT CARDIOVERSION N/A 11/14/2014   Procedure: TRANSESOPHAGEAL ECHOCARDIOGRAM (TEE);  Surgeon: [name], MD;  Location: [place];  Service: Open Heart Surgery;  Laterality: N/A;  . TEMPORARY PACEMAKER INSERTION  11/04/2014   Procedure: TEMPORARY PACEMAKER INSERTION;  Surgeon: Troy Sine, MD;  Location: [facility] CATH LAB;  Service: Cardiovascular;;    FAMHx:  Family History  Problem Relation Age of Onset  . Cancer Mother   . Heart disease Father     SOCHx:   reports that she has been smoking Cigarettes.  She has a 32.00 pack-year smoking history. She has never used smokeless tobacco. She reports that she does not drink alcohol or use drugs.  ALLERGIES:  Allergies  Allergen Reactions  . Bee Venom Anaphylaxis  . Codeine Nausea And Vomiting  . Shrimp [Shellfish Allergy] Swelling  . Tomato     Pt states it gives her gout    ROS: Pertinent items noted in HPI and remainder of comprehensive ROS otherwise negative.  HOME MEDS: Current Outpatient Prescriptions  Medication Sig Dispense Refill  . acetaminophen (TYLENOL) 500 MG tablet Take 500 mg by mouth every 6 (six) hours as needed for moderate pain.     Marland Kitchen aspirin EC 81 MG  EC tablet Take 1 tablet (81 mg total) by mouth daily.    Marland Kitchen atorvastatin (LIPITOR) 10 MG tablet TAKE 1 TABLET EVERY DAY  AT  6PM 90 tablet 3  . carvedilol (COREG) 6.25 MG tablet Take 1 tablet (6.25 mg total) by mouth 2 (two) times daily. 60 tablet 0  . predniSONE (DELTASONE) 10 MG tablet Take 10 mg by mouth 2 (two) times daily as needed for pain. Pt takes as needed for gout flare up    . ranitidine (ZANTAC) 150 MG capsule Take 150 mg by mouth 2 (two) times daily.    Marland Kitchen triamterene-hydrochlorothiazide (DYAZIDE) 37.5-25 MG capsule Take 1 capsule by mouth daily.    Marland Kitchen [name] 40 MG tablet Take 20 mg by mouth daily as needed (gout flare up).      No current facility-administered medications for this visit.     LABS/IMAGING: No  results found for this or any previous visit (from the past 31 hour(s)). No results found.  WEIGHTS: Wt Readings from Last 3 Encounters:  02/08/17 218 lb (98.9 kg)  01/30/17 217 lb 2.5 oz (98.5 kg)  12/26/16 219 lb 12.8 oz (99.7 kg)    VITALS: BP (!) 149/86   Pulse 69   Ht 5' 7.5" (1.715 m)   Wt 218 lb (98.9 kg)   BMI 33.64 kg/m   EXAM: General appearance: alert, no distress and mildly obese Neck: no carotid bruit and no JVD Lungs: clear to auscultation bilaterally Heart: regular rate and rhythm, S1, S2 normal, no murmur, click, rub or gallop Abdomen: soft, non-tender; bowel sounds normal; no masses,  no organomegaly Extremities: extremities normal, atraumatic, no cyanosis or edema Pulses: 2+ and symmetric Skin: Skin color, texture, turgor normal. No rashes or lesions Neurologic: Grossly normal no changes  EKG: Deferred  ASSESSMENT: 1. PAF-recent recurrence with auto conversion on diltiazem 2. Coronary artery disease status post CABG 2 with LIMA to LAD and SVG to OM 3. Acute on chronic combined systolic congestive heart failure, EF 35-40% (improved to 50-55% - 5/18) 4. Paroxysmal atrial fibrillation-CHADSVASC 4, - status post left atrial closure (not  on anticoagulation) 5. Dyslipidemia 6. Hypertension - uncontrolled 7. Dyspnea on exertion 8. Dysphasia to solid foods, bloating  PLAN: 1.   Mrs. Geng had recurrent PAF but converted on IV diltiazem. Because of left atrial appendage closure she is not on anticoagulation beyond aspirin. A repeat recent repeat echo showed stable EF of 50-55%. I would advise starting low-dose diltiazem LA 120 mg daily. This will be in addition to her carvedilol to help prevent recurrent A. fib and rate control her. No changes to her anticoagulation. Finally, given her dysphasia and bloating I would recommend a GI evaluation. She may have some degree of stricture. Plan referral to Dr. Benson Norway. I'll with me afterwards in 6 months.  [name], MD, [facility] Attending Cardiologist [place] 02/08/2017, 1:35 PM

## 2017-02-08 NOTE — Patient Instructions (Signed)
Your physician has recommended you make the following change in your medication: START diltiazem 120mg  once daily  You have been referred to Dr. Benson Norway (gastroenterologist)  Your physician wants you to follow-up in: 6 months with Dr. Debara Pickett. You will receive a reminder letter in the mail two months in advance. If you don't receive a letter, please call our office to schedule the follow-up appointment.

## 2017-02-10 ENCOUNTER — Other Ambulatory Visit: Payer: Self-pay | Admitting: Internal Medicine

## 2017-02-10 DIAGNOSIS — Z0000 Encounter for general adult medical examination without abnormal findings: Secondary | ICD-10-CM | POA: Diagnosis not present

## 2017-02-10 DIAGNOSIS — Z1231 Encounter for screening mammogram for malignant neoplasm of breast: Secondary | ICD-10-CM | POA: Diagnosis not present

## 2017-02-10 DIAGNOSIS — N183 Chronic kidney disease, stage 3 (moderate): Secondary | ICD-10-CM | POA: Diagnosis not present

## 2017-02-10 DIAGNOSIS — K760 Fatty (change of) liver, not elsewhere classified: Secondary | ICD-10-CM | POA: Diagnosis not present

## 2017-02-10 DIAGNOSIS — I4891 Unspecified atrial fibrillation: Secondary | ICD-10-CM | POA: Diagnosis not present

## 2017-02-10 DIAGNOSIS — Z1389 Encounter for screening for other disorder: Secondary | ICD-10-CM | POA: Diagnosis not present

## 2017-02-10 DIAGNOSIS — R7303 Prediabetes: Secondary | ICD-10-CM | POA: Diagnosis not present

## 2017-02-10 DIAGNOSIS — M109 Gout, unspecified: Secondary | ICD-10-CM | POA: Diagnosis not present

## 2017-02-10 DIAGNOSIS — E7800 Pure hypercholesterolemia, unspecified: Secondary | ICD-10-CM | POA: Diagnosis not present

## 2017-02-10 DIAGNOSIS — I214 Non-ST elevation (NSTEMI) myocardial infarction: Secondary | ICD-10-CM | POA: Diagnosis not present

## 2017-02-10 DIAGNOSIS — Z6832 Body mass index (BMI) 32.0-32.9, adult: Secondary | ICD-10-CM | POA: Diagnosis not present

## 2017-02-10 DIAGNOSIS — I25810 Atherosclerosis of coronary artery bypass graft(s) without angina pectoris: Secondary | ICD-10-CM | POA: Diagnosis not present

## 2017-02-10 DIAGNOSIS — I10 Essential (primary) hypertension: Secondary | ICD-10-CM | POA: Diagnosis not present

## 2017-02-10 DIAGNOSIS — K219 Gastro-esophageal reflux disease without esophagitis: Secondary | ICD-10-CM | POA: Diagnosis not present

## 2017-02-10 DIAGNOSIS — M8588 Other specified disorders of bone density and structure, other site: Secondary | ICD-10-CM | POA: Diagnosis not present

## 2017-02-23 ENCOUNTER — Ambulatory Visit: Payer: PPO

## 2017-02-27 DIAGNOSIS — N939 Abnormal uterine and vaginal bleeding, unspecified: Secondary | ICD-10-CM | POA: Diagnosis not present

## 2017-02-27 DIAGNOSIS — R319 Hematuria, unspecified: Secondary | ICD-10-CM | POA: Diagnosis not present

## 2017-02-27 DIAGNOSIS — N183 Chronic kidney disease, stage 3 (moderate): Secondary | ICD-10-CM | POA: Diagnosis not present

## 2017-03-06 DIAGNOSIS — R1310 Dysphagia, unspecified: Secondary | ICD-10-CM | POA: Diagnosis not present

## 2017-03-15 DIAGNOSIS — M1A00X0 Idiopathic chronic gout, unspecified site, without tophus (tophi): Secondary | ICD-10-CM | POA: Diagnosis not present

## 2017-03-15 DIAGNOSIS — N760 Acute vaginitis: Secondary | ICD-10-CM | POA: Diagnosis not present

## 2017-03-15 DIAGNOSIS — Z720 Tobacco use: Secondary | ICD-10-CM | POA: Diagnosis not present

## 2017-03-15 DIAGNOSIS — I10 Essential (primary) hypertension: Secondary | ICD-10-CM | POA: Diagnosis not present

## 2017-03-15 DIAGNOSIS — Z131 Encounter for screening for diabetes mellitus: Secondary | ICD-10-CM | POA: Diagnosis not present

## 2017-03-15 DIAGNOSIS — E669 Obesity, unspecified: Secondary | ICD-10-CM | POA: Diagnosis not present

## 2017-03-15 DIAGNOSIS — I2510 Atherosclerotic heart disease of native coronary artery without angina pectoris: Secondary | ICD-10-CM | POA: Diagnosis not present

## 2017-04-11 ENCOUNTER — Ambulatory Visit (HOSPITAL_COMMUNITY)
Admission: EM | Admit: 2017-04-11 | Discharge: 2017-04-11 | Disposition: A | Discharge status: Home / Self Care | Payer: PPO | Attending: Emergency Medicine | Admitting: Emergency Medicine

## 2017-04-11 ENCOUNTER — Encounter (HOSPITAL_COMMUNITY): Payer: Self-pay | Admitting: Family Medicine

## 2017-04-11 DIAGNOSIS — N760 Acute vaginitis: Secondary | ICD-10-CM | POA: Diagnosis not present

## 2017-04-11 LAB — POCT URINALYSIS DIP (DEVICE)
Bilirubin Urine: NEGATIVE
Glucose, UA: NEGATIVE mg/dL
Ketones, ur: NEGATIVE mg/dL
Nitrite: NEGATIVE
Protein, ur: NEGATIVE mg/dL
Specific Gravity, Urine: 1.02 (ref 1.005–1.030)
Urobilinogen, UA: 0.2 mg/dL (ref 0.0–1.0)
pH: 7 (ref 5.0–8.0)

## 2017-04-11 MED ORDER — FLUCONAZOLE 200 MG PO TABS: ORAL_TABLET | ORAL | 0 refills | Status: DC

## 2017-04-11 NOTE — ED Provider Notes (Signed)
  [place]   [id number] 04/11/17 Arrival Time: 1250   SUBJECTIVE:  [name] [name] is a 71 y.o. female who presents to the urgent care  with complaint of vaginal itching and discomfort for 2 weeks. She was seen at an outside urgent care 2 weeks ago, diagnosed with bacterial vaginosis, and started on metronidazole gel, she has had no relief in her symptoms, does report a thick brownish discharge. No dysuria, no frequency, but does state she feels an "pressure" in her bladder. No fever, no chills, no nausea, no vomiting, no flank pain, or other symptoms. Her primary care provider referred her to gynecology, however she has not made an appointment to see the gynecologist yet  ROS: As per HPI, remainder of ROS negative.   OBJECTIVE:  Vitals:   04/11/17 1344  BP: (!) 153/78  Pulse: (!) 56  Resp: 18  Temp: 98.4 F (36.9 C)  SpO2: 97%     General appearance: alert; no distress HEENT: normocephalic; atraumatic; conjunctivae normal;  Lungs: clear to auscultation bilaterally Heart: regular rate and rhythm Abdomen: soft, non-tender; No CVA tenderness Musculoskeletal: Grossly symmetrical, no peripheral edema Skin: warm and dry Neurologic: Grossly normal Psychological:  alert and cooperative; normal mood and affect     ASSESSMENT & PLAN:  1. Vaginitis and vulvovaginitis     Meds ordered this encounter  Medications  . fluconazole (DIFLUCAN) 200 MG tablet    Sig: Take one tablet today, wait 3 days, take the second tablet    Dispense:  2 tablet    Refill:  0    Order Specific Question:   Supervising Provider    Answer:   Melynda Ripple [[id number]]    UA is not convincing of UTI, we'll send urine for culture to confirm, urine cytology obtained to check for yeast and BV. We'll treat presumptively with Diflucan, follow up with gynecology  Reviewed expectations re: course of current medical issues. Questions answered. Outlined signs and symptoms indicating need for more  acute intervention. Patient verbalized understanding. After Visit Summary given.    Procedures:     Results for orders placed or performed during the hospital encounter of 04/11/17  POCT urinalysis dip (device)  Result Value Ref Range   Glucose, UA NEGATIVE NEGATIVE mg/dL   Bilirubin Urine NEGATIVE NEGATIVE   Ketones, ur NEGATIVE NEGATIVE mg/dL   Specific Gravity, Urine 1.020 1.005 - 1.030   Hgb urine dipstick TRACE (A) NEGATIVE   pH 7.0 5.0 - 8.0   Protein, ur NEGATIVE NEGATIVE mg/dL   Urobilinogen, UA 0.2 0.0 - 1.0 mg/dL   Nitrite NEGATIVE NEGATIVE   Leukocytes, UA SMALL (A) NEGATIVE    Labs Reviewed  POCT URINALYSIS DIP (DEVICE) - Abnormal; Notable for the following:       Result Value   Hgb urine dipstick TRACE (*)    Leukocytes, UA SMALL (*)    All other components within normal limits  URINE CULTURE  URINE CYTOLOGY ANCILLARY ONLY    No results found.  Allergies  Allergen Reactions  . Bee Venom Anaphylaxis  . Codeine Nausea And Vomiting  . Shrimp [Shellfish Allergy] Swelling  . Tomato     Pt states it gives her gout    PMHx, SurgHx, SocialHx, Medications, and Allergies were reviewed in the Visit Navigator and updated as appropriate.       Barnet Glasgow, NP 04/11/17 1428

## 2017-04-11 NOTE — ED Triage Notes (Signed)
Pt here for itching and burning in vaginal area. sts that she has been taking metronidazole gel for 2 weeks and not better.

## 2017-04-11 NOTE — Discharge Instructions (Signed)
Your urine test for infection was inconclusive, we will send this off further testing, there any positives will contact you. We also testing for yeast and for bacterial vaginosis, if days are positive we will contact you as well. Treating him presumptively for yeast with Diflucan, take 1 tablet now, wait 3 days and then take a second tablet I encouraged you to follow up with a gynecologist for further evaluation

## 2017-04-12 LAB — URINE CULTURE: Culture: 60000 — AB

## 2017-04-14 LAB — URINE CYTOLOGY ANCILLARY ONLY
Bacterial vaginitis: POSITIVE — AB
Candida vaginitis: NEGATIVE

## 2017-04-17 ENCOUNTER — Telehealth (HOSPITAL_COMMUNITY): Payer: Self-pay | Admitting: Emergency Medicine

## 2017-04-17 MED ORDER — METRONIDAZOLE 500 MG PO TABS: 500.0000 mg | ORAL_TABLET | Freq: Two times a day (BID) | ORAL | 0 refills | Status: DC

## 2017-04-17 NOTE — Telephone Encounter (Signed)
-----   Message from Sherlene Shams, MD sent at 04/15/2017  8:04 AM EDT ----- Clinical staff, please let patient know that test for gardnerella (bacterial vaginosis) was positive.  This only needs to be treated if there are persistent symptoms, such as vaginal irritation/discharge.  If these symptoms are present, ok to send rx for metronidazole 500mg  bid x 7d #14 no refills or metronidazole vaginal gel 1.66% 1 applicatorful bid x 7d #06 no refills.  Recheck or followup with primary care provider for further evaluation if symptoms are not improving. Note sent to patient's MyChart.  Jodi Mourning MD

## 2017-04-17 NOTE — Telephone Encounter (Signed)
Called pt to notify of recent lab results.... Pt ID'd properly Reports feeling better and sx are subsiding Pt is taking/tolarating well meds given at visit.  Pt sts she wants flagyl to be called into [facility]) even though she is feeling better.  Adv pt if sx are not getting better to return or to f/u w/PCP Notified pt that lab results can be obtained through MyChart Pt verb understanding.

## 2017-04-18 DIAGNOSIS — K228 Other specified diseases of esophagus: Secondary | ICD-10-CM | POA: Diagnosis not present

## 2017-04-18 DIAGNOSIS — R1310 Dysphagia, unspecified: Secondary | ICD-10-CM | POA: Diagnosis not present

## 2017-05-25 ENCOUNTER — Ambulatory Visit (INDEPENDENT_AMBULATORY_CARE_PROVIDER_SITE_OTHER): Payer: PPO | Admitting: Physician Assistant

## 2017-05-25 ENCOUNTER — Encounter: Payer: Self-pay | Admitting: Physician Assistant

## 2017-05-25 VITALS — BP 151/101 | HR 56 | Ht 67.5 in | Wt 219.0 lb

## 2017-05-25 DIAGNOSIS — Z720 Tobacco use: Secondary | ICD-10-CM

## 2017-05-25 DIAGNOSIS — I4891 Unspecified atrial fibrillation: Secondary | ICD-10-CM

## 2017-05-25 DIAGNOSIS — R0789 Other chest pain: Secondary | ICD-10-CM

## 2017-05-25 DIAGNOSIS — I25708 Atherosclerosis of coronary artery bypass graft(s), unspecified, with other forms of angina pectoris: Secondary | ICD-10-CM | POA: Diagnosis not present

## 2017-05-25 MED ORDER — DILTIAZEM HCL ER COATED BEADS 120 MG PO CP24: 120.0000 mg | ORAL_CAPSULE | Freq: Every day | ORAL | 5 refills | Status: DC

## 2017-05-25 NOTE — Patient Instructions (Addendum)
Medication Instructions:   No changes to your current medications. Make sure you are taking your diltiazem daily, and taking your carvedilol (coreg) two times a day.   Labwork:   none  Testing/Procedures:  None  Follow-Up:  1 week with [name] Deforest PA  If you need a refill on your cardiac medications before your next appointment, please call your pharmacy.

## 2017-05-25 NOTE — Progress Notes (Signed)
Cardiology Office Note    Date:  05/27/2017   ID:  B[place], [place] 08/16/1945, MRN [id number]  PCP:  [name], MD  Cardiologist:  Dr. Debara Pickett   Chief Complaint  Patient presents with  . Follow-up    seen for Dr. Debara Pickett    History of Present Illness:  KHRISTINE VERNO is a 71 y.o. female with PMH of HTN, tobacco abuse and PAF with RVR. She was admitted to [facility] in March 2016 with several weeks history of worsening shortness of breath, chest tightness. She was noted by her PCP to be in atrial fibrillation with RVR. Chest x-ray also was sewn concerning for pneumonia, was treated with antibiotics. She underwent 2-D echo which showed reduced EF of 35-40%, global hypokinesis especially in the inferior wall. It was felt the patient should undergo cardiac catheterization for further workup, however she became very anxious and would not consent to the procedure. This was eventually done on 11/03/2014, prior to the procedure, she became agitated, combative and the short of breath, she progressed to full cardiac arrest with asystole without any preceding sign of symptom. She was successfully resuscitated but showed sign of decerebrate posterior and was placed on cooling protocol. During the cooling process, she continued to have atrial fibrillation which progressed to bradycardia. It was again decided she should undergo cardiac catheterization. This was completed on 11/04/2014 which revealed significant coronary artery disease. Bypass surgery was recommended and she was seen by Dr. Roxy Manns who agreed at some point she will benefit from coronary bypass graft procedure. However at the time of evaluation, patient will remain on ventilator and was sedated without sign of neurological recovery. After recovery, she eventually underwent CABG 2 with LIMA to LAD, SVG to OM on 11/14/2014. She also underwent clipping of her left atrial appendage and endoscopic saphenous vein harvest from the right thigh. She  did have postoperative atrial fibrillation. She was treated with amiodarone and metoprolol but ultimately did not convert to sinus rhythm. EP was consulted and she had unsuccessful DC cardioversion on postoperative day #4. Fortunately, patient eventually converted to sinus rhythm on her own. She was last admitted in June 2018 with atrial fibrillation, she self converted and had 1.5-2 second postconversion bradycardia and pauses. She was not placed on systemic anticoagulation.  She presents today to cardiology service for follow-up. On physical exam, her heart rate is irregular, EKG confirms she is in atrial flutter with RVR, heart rate 117. She also described a chest tightness that has been going on for the past several weeks, it mainly occurs when she first wakes up or bend over. Otherwise it does not seems to occur with exertion. It is unclear to me if her chest discomfort is angina or atrial flutter. I think with atrial flutter with RVR, the top priority is for rate control. She has not been taking her diltiazem daily, instead she is taking it only on as needed basis. For the past several weeks, she only took it about once a week. I encouraged her to take it on a daily basis. I will bring her back in a week for reassessment. If she is rate controlled and the continued to have chest discomfort, I would have low threshold to order a stress test.   Past Medical History:  Diagnosis Date  . Acute on chronic combined systolic and diastolic HF (heart failure) ([place]) 10/30/2014   EF 35-40% with RWMA   . Arthritis   . Asthma   .  Atrial fibrillation with RVR ([place])   . Cardiomyopathy, ischemic   . Hypertension   . Left main coronary artery disease 11/04/2014  . S/P CABG x 2 with clipping of LA appendage 11/14/2014   LIMA to LAD, SVG to OM, EVH via right thigh    Past Surgical History:  Procedure Laterality Date  . CARDIOVERSION N/A 11/18/2014   Procedure: CARDIOVERSION;  Surgeon: [name], MD;   Location: [facility];  Service: Cardiovascular;  Laterality: N/A;  . CLIPPING OF ATRIAL APPENDAGE N/A 11/14/2014   Procedure: CLIPPING OF ATRIAL APPENDAGE;  Surgeon: [name], MD;  Location: [facility];  Service: Open Heart Surgery;  Laterality: N/A;  . CORONARY ARTERY BYPASS GRAFT N/A 11/14/2014   Procedure: CORONARY ARTERY BYPASS GRAFTING (CABG)TIMES 2 USING LEFT INTERNAL MAMMARY ARTERY AND RIGHT SAPHENOUS VEIN HARVESTED ENDOSCOPICALLY;  Surgeon: [name], MD;  Location: [facility];  Service: Open Heart Surgery;  Laterality: N/A;  . LEFT HEART CATHETERIZATION WITH CORONARY ANGIOGRAM N/A 11/04/2014   Procedure: LEFT HEART CATHETERIZATION WITH CORONARY ANGIOGRAM;  Surgeon: Troy Sine, MD;  Location: [facility] CATH LAB;  Service: Cardiovascular;  Laterality: N/A;  . TEE WITHOUT CARDIOVERSION N/A 11/14/2014   Procedure: TRANSESOPHAGEAL ECHOCARDIOGRAM (TEE);  Surgeon: [name], MD;  Location: [place];  Service: Open Heart Surgery;  Laterality: N/A;  . TEMPORARY PACEMAKER INSERTION  11/04/2014   Procedure: TEMPORARY PACEMAKER INSERTION;  Surgeon: Troy Sine, MD;  Location: [facility] CATH LAB;  Service: Cardiovascular;;    Current Medications: Outpatient Medications Prior to Visit  Medication Sig Dispense Refill  . acetaminophen (TYLENOL) 500 MG tablet Take 500 mg by mouth every 6 (six) hours as needed for moderate pain.     Marland Kitchen aspirin EC 81 MG EC tablet Take 1 tablet (81 mg total) by mouth daily.    Marland Kitchen atorvastatin (LIPITOR) 10 MG tablet TAKE 1 TABLET EVERY DAY  AT  6PM 90 tablet 3  . carvedilol (COREG) 6.25 MG tablet Take 1 tablet (6.25 mg total) by mouth 2 (two) times daily. 180 tablet 3  . fluconazole (DIFLUCAN) 200 MG tablet Take one tablet today, wait 3 days, take the second tablet 2 tablet 0  . metroNIDAZOLE (FLAGYL) 500 MG tablet Take 1 tablet (500 mg total) by mouth 2 (two) times daily. 14 tablet 0  . predniSONE (DELTASONE) 10 MG tablet Take 10 mg by mouth 2 (two) times daily as needed for pain. Pt  takes as needed for gout flare up    . ranitidine (ZANTAC) 150 MG capsule Take 150 mg by mouth 2 (two) times daily.    Marland Kitchen triamterene-hydrochlorothiazide (DYAZIDE) 37.5-25 MG capsule Take 1 capsule by mouth daily.    Marland Kitchen [name] 40 MG tablet Take 20 mg by mouth daily as needed (gout flare up).     . diltiazem (CARDIZEM CD) 120 MG 24 hr capsule Take 1 capsule (120 mg total) by mouth daily. 30 capsule 11   No facility-administered medications prior to visit.      Allergies:   Bee venom; Codeine; Shrimp [shellfish allergy]; and Tomato   Social History   Social History  . Marital status: Widowed    Spouse name: N/A  . Number of children: N/A  . Years of education: N/A   Social History Main Topics  . Smoking status: Current Some Day Smoker    Packs/day: 1.00    Years: 32.00    Types: Cigarettes  . Smokeless tobacco: Never Used     Comment: occasional  puff here and there - 02/09/15  . Alcohol use No  . Drug use: No  . Sexual activity: Not Asked   Other Topics Concern  . None   Social History Narrative  . None     Family History:  [name] family history includes Cancer in her mother; Heart disease in her father.   ROS:   Please see the history of present illness.    ROS All other systems reviewed and are negative.   PHYSICAL EXAM:   VS:  BP (!) 151/101   Pulse (!) 56   Ht 5' 7.5" (1.715 m)   Wt 219 lb (99.3 kg)   BMI 33.79 kg/m    GEN: Well nourished, well developed, in no acute distress  HEENT: normal  Neck: no JVD, carotid bruits, or masses Cardiac: Irregularly irregular; no murmurs, rubs, or gallops,no edema  Respiratory:  clear to auscultation bilaterally, normal work of breathing GI: soft, nontender, nondistended, + BS MS: no deformity or atrophy  Skin: warm and dry, no rash Neuro:  Alert and Oriented x 3, Strength and sensation are intact Psych: euthymic mood, full affect  Wt Readings from Last 3 Encounters:  05/25/17 219 lb (99.3 kg)  02/08/17 218 lb  (98.9 kg)  01/30/17 217 lb 2.5 oz (98.5 kg)      Studies/Labs Reviewed:   EKG:  EKG is ordered today.  The ekg ordered today demonstrates Atrial flutter with RVR, heart rate 117.  Recent Labs: 01/30/2017: TSH 1.142 01/31/2017: BUN 24; Creatinine, Ser 1.04; Hemoglobin 15.1; Magnesium 1.9; Platelets 162; Potassium 3.6; Sodium 141   Lipid Panel    Component Value Date/Time   CHOL 116 11/12/2014 0432   TRIG 111 11/12/2014 0432   HDL 34 (L) 11/12/2014 0432   CHOLHDL 3.4 11/12/2014 0432   VLDL 22 11/12/2014 0432   LDLCALC 60 11/12/2014 0432    Additional studies/ records that were reviewed today include:   Cath 11/04/2014 IMPRESSION:  Moderate LV dysfunction secondary to ischemic cardiomyopathy with an ejection fraction of 35-40%.  Significant coronary obstructive disease with 80% focal distal left main stenosis and 80% stenosis in the AV groove circumflex vessel proximal to the OM1 takeoff; normal LAD and RCA vessels.  Insertion of temporary pacemaker for marked bradycardia with initial heart rates in the upper 30s.    Echo 01/11/2017 LV EF: 50% -   55%  Study Conclusions  - Left ventricle: Posterior lateral hypokinesis. The cavity size   was normal. Systolic function was normal. The estimated ejection   fraction was in the range of 50% to 55%. Left ventricular   diastolic function parameters were normal. - Aortic valve: There was mild regurgitation. - Mitral valve: There was mild regurgitation. - Left atrium: The atrium was moderately dilated. - Atrial septum: No defect or patent foramen ovale was identified.   ASSESSMENT:    1. Atypical chest pain   2. Atrial fibrillation with rapid ventricular response ([place])   3. Tobacco abuse   4. Coronary artery disease involving coronary bypass graft of native heart with other forms of angina pectoris ([place])      PLAN:  In order of problems listed above:  1. Atypical chest pain: Mainly occurs when she first wakes up or  bend over, quickly goes away, does not seem to occur with exertion. Unclear if related to recent atrial fibrillation with RVR or represent angina. I will attempt to control atrial fibrillation for now, I will bring her back in a week for  reassessment. I would have low threshold to order a nuclear stress test  2. Atrial fibrillation with RVR: Unknown duration, EKG also appears to be atrial flutter as well. She has not been compliant with her diltiazem and only takes it about once a week. I encouraged her to be more compliant with diltiazem. More week for reassessment.  3. CAD s/p two-vessel CABG in 2016: See #1, although have atypical chest discomfort, occurred in the setting of recurrent atrial fibrillation with RVR. Once heart rate is under control, if she is still symptomatic, plan for outpatient Myoview.    Medication Adjustments/Labs and Tests Ordered: Current medicines are reviewed at length with the patient today.  Concerns regarding medicines are outlined above.  Medication changes, Labs and Tests ordered today are listed in the Patient Instructions below. Patient Instructions  Medication Instructions:   No changes to your current medications. Make sure you are taking your diltiazem daily, and taking your carvedilol (coreg) two times a day.   Labwork:   none  Testing/Procedures:  None  Follow-Up:  1 week with Almyra Deforest PA  If you need a refill on your cardiac medications before your next appointment, please call your pharmacy.      Hilbert Corrigan, [place]  05/27/2017 8:43 AM    [address], [place], [place]  [address] Phone: [phone]; Fax: [phone]

## 2017-05-27 ENCOUNTER — Encounter: Payer: Self-pay | Admitting: Physician Assistant

## 2017-05-29 DIAGNOSIS — M8588 Other specified disorders of bone density and structure, other site: Secondary | ICD-10-CM | POA: Diagnosis not present

## 2017-05-29 NOTE — Addendum Note (Signed)
Addended by: Theodore Demark on: 05/29/2017 08:10 AM   Modules accepted: Orders

## 2017-06-01 ENCOUNTER — Encounter: Payer: Self-pay | Admitting: Physician Assistant

## 2017-06-01 ENCOUNTER — Ambulatory Visit (INDEPENDENT_AMBULATORY_CARE_PROVIDER_SITE_OTHER): Payer: PPO | Admitting: Physician Assistant

## 2017-06-01 VITALS — BP 136/84 | HR 112 | Ht 67.5 in | Wt 219.0 lb

## 2017-06-01 DIAGNOSIS — I25810 Atherosclerosis of coronary artery bypass graft(s) without angina pectoris: Secondary | ICD-10-CM | POA: Diagnosis not present

## 2017-06-01 DIAGNOSIS — R0789 Other chest pain: Secondary | ICD-10-CM

## 2017-06-01 DIAGNOSIS — I4891 Unspecified atrial fibrillation: Secondary | ICD-10-CM

## 2017-06-01 DIAGNOSIS — Z720 Tobacco use: Secondary | ICD-10-CM | POA: Diagnosis not present

## 2017-06-01 MED ORDER — DILTIAZEM HCL ER COATED BEADS 240 MG PO CP24: 240.0000 mg | ORAL_CAPSULE | Freq: Every day | ORAL | 5 refills | Status: DC

## 2017-06-01 NOTE — Patient Instructions (Signed)
Medication Instructions:   INCREASE Diltiazem to 240mg  DAILY  Labwork:   none  Testing/Procedures:  none  Follow-Up:  With Dr. Debara Pickett in 2-3 weeks.   If you need a refill on your cardiac medications before your next appointment, please call your pharmacy.

## 2017-06-01 NOTE — Progress Notes (Signed)
Cardiology Office Note    Date:  06/02/2017   ID:  Karri, [place] 1946-04-29, MRN [id number]  PCP:  [name], MD  Cardiologist:  Dr. Debara Pickett  Chief Complaint  Patient presents with  . Follow-up    Seen for Dr. Debara Pickett    History of Present Illness:  [name] [name] is a 71 y.o. female with PMH of HTN, tobacco abuse and PAF with RVR. She was admitted to [facility] in March 2016 with several weeks history of worsening shortness of breath, chest tightness. She was noted by her PCP to be in atrial fibrillation with RVR. Chest x-ray also was sewn concerning for pneumonia, was treated with antibiotics. She underwent 2-D echo which showed reduced EF of 35-40%, global hypokinesis especially in the inferior wall. It was felt the patient should undergo cardiac catheterization for further workup, however she became very anxious and would not consent to the procedure. This was eventually done on 11/03/2014, prior to the procedure, she became agitated, combative and the short of breath, she progressed to full cardiac arrest with asystole without any preceding sign of symptom. She was successfully resuscitated but showed sign of decerebrate posterior and was placed on cooling protocol. During the cooling process, she continued to have atrial fibrillation which progressed to bradycardia. It was again decided she should undergo cardiac catheterization. This was completed on 11/04/2014 which revealed significant coronary artery disease. Bypass surgery was recommended and she was seen by Dr. Roxy Manns who agreed at some point she will benefit from coronary bypass graft procedure. However at the time of evaluation, patient will remain on ventilator and was sedated without sign of neurological recovery. After recovery, she eventually underwent CABG 2 with LIMA to LAD, SVG to OM on 11/14/2014. She also underwent clipping of her left atrial appendage and endoscopic saphenous vein harvest from the right thigh. She  did have postoperative atrial fibrillation. She was treated with amiodarone and metoprolol but ultimately did not convert to sinus rhythm. EP was consulted and she had unsuccessful DC cardioversion on postoperative day #4. Fortunately, patient eventually converted to sinus rhythm on her own. She was last admitted in June 2018 with atrial fibrillation, she self converted and had 1.5-2 second postconversion bradycardia and pauses. She was not placed on systemic anticoagulation.  I last saw the patient on 05/25/2017, heart rate was irregular. EKG confirmed she was in atrial flutter with RVR, heart rate of 117. She also described a chest tightness going on for the past several weeks. It occurs when she first wakes up in the bend over. It does not seems to occur with exertion. I was not sure if her symptom was related to atrial flutter versus angina. She had was noncompliant with her diltiazem and was only taking it once a week. I put her on daily dosing of diltiazem along with carvedilol 6.25 mg twice a day.   She presents to cardiology office visit today. Her heart rate is 112, however after resting for 10 minutes, her heart rate decreased down to 80s and 90s. I will increase her diltiazem to 240 mg daily. I'm hesitant to increase diltiazem any further due to her prior history of prolonged post-termination pause up to 2 seconds. I will set her up to see Dr. Debara Pickett on follow-up. Her atypical chest discomfort has completely resolved, therefore I decided not to pursue any further ischemic workup at this time unless any recurrence. She does complain of occasional shortness of breath, she does not  appears to be volume overloaded. On exam, she has diminished breath sound bilaterally, previous PFT in 2016 was consistent with at least moderate COPD at the time. She is still smoking at this time likely continue to worsening breathing. She does not have a pulmonologist at this time. I will defer to primary care  provider.    Past Medical History:  Diagnosis Date  . Acute on chronic combined systolic and diastolic HF (heart failure) ([place]) 10/30/2014   EF 35-40% with RWMA   . Arthritis   . Asthma   . Atrial fibrillation with RVR ([place])   . Cardiomyopathy, ischemic   . Hypertension   . Left main coronary artery disease 11/04/2014  . S/P CABG x 2 with clipping of LA appendage 11/14/2014   LIMA to LAD, SVG to OM, EVH via right thigh    Past Surgical History:  Procedure Laterality Date  . CARDIOVERSION N/A 11/18/2014   Procedure: CARDIOVERSION;  Surgeon: [name], MD;  Location: [facility];  Service: Cardiovascular;  Laterality: N/A;  . CLIPPING OF ATRIAL APPENDAGE N/A 11/14/2014   Procedure: CLIPPING OF ATRIAL APPENDAGE;  Surgeon: [name], MD;  Location: [facility];  Service: Open Heart Surgery;  Laterality: N/A;  . CORONARY ARTERY BYPASS GRAFT N/A 11/14/2014   Procedure: CORONARY ARTERY BYPASS GRAFTING (CABG)TIMES 2 USING LEFT INTERNAL MAMMARY ARTERY AND RIGHT SAPHENOUS VEIN HARVESTED ENDOSCOPICALLY;  Surgeon: [name], MD;  Location: [facility];  Service: Open Heart Surgery;  Laterality: N/A;  . LEFT HEART CATHETERIZATION WITH CORONARY ANGIOGRAM N/A 11/04/2014   Procedure: LEFT HEART CATHETERIZATION WITH CORONARY ANGIOGRAM;  Surgeon: Troy Sine, MD;  Location: [facility] CATH LAB;  Service: Cardiovascular;  Laterality: N/A;  . TEE WITHOUT CARDIOVERSION N/A 11/14/2014   Procedure: TRANSESOPHAGEAL ECHOCARDIOGRAM (TEE);  Surgeon: [name], MD;  Location: [facility];  Service: Open Heart Surgery;  Laterality: N/A;  . TEMPORARY PACEMAKER INSERTION  11/04/2014   Procedure: TEMPORARY PACEMAKER INSERTION;  Surgeon: Troy Sine, MD;  Location[facility] CATH LAB;  Service: Cardiovascular;;    Current Medications: Outpatient Medications Prior to Visit  Medication Sig Dispense Refill  . acetaminophen (TYLENOL) 500 MG tablet Take 500 mg by mouth every 6 (six) hours as needed for moderate pain.     Marland Kitchen aspirin EC 81  MG EC tablet Take 1 tablet (81 mg total) by mouth daily.    Marland Kitchen atorvastatin (LIPITOR) 10 MG tablet TAKE 1 TABLET EVERY DAY  AT  6PM 90 tablet 3  . carvedilol (COREG) 6.25 MG tablet Take 1 tablet (6.25 mg total) by mouth 2 (two) times daily. 180 tablet 3  . ranitidine (ZANTAC) 150 MG capsule Take 150 mg by mouth 2 (two) times daily.    Marland Kitchen triamterene-hydrochlorothiazide (DYAZIDE) 37.5-25 MG capsule Take 1 capsule by mouth daily.    Marland Kitchen [name] 40 MG tablet Take 20 mg by mouth daily as needed (gout flare up).     . diltiazem (CARDIZEM CD) 120 MG 24 hr capsule Take 1 capsule (120 mg total) by mouth daily. 30 capsule 5  . fluconazole (DIFLUCAN) 200 MG tablet Take one tablet today, wait 3 days, take the second tablet 2 tablet 0  . metroNIDAZOLE (FLAGYL) 500 MG tablet Take 1 tablet (500 mg total) by mouth 2 (two) times daily. 14 tablet 0  . predniSONE (DELTASONE) 10 MG tablet Take 10 mg by mouth 2 (two) times daily as needed for pain. Pt takes as needed for gout flare up  No facility-administered medications prior to visit.      Allergies:   Bee venom; Codeine; Shrimp [shellfish allergy]; and Tomato   Social History   Social History  . Marital status: Widowed    Spouse name: N/A  . Number of children: N/A  . Years of education: N/A   Social History Main Topics  . Smoking status: Current Some Day Smoker    Packs/day: 1.00    Years: 32.00    Types: Cigarettes  . Smokeless tobacco: Never Used     Comment: occasional puff here and there - 02/09/15  . Alcohol use No  . Drug use: No  . Sexual activity: Not Asked   Other Topics Concern  . None   Social History Narrative  . None     Family History:  [name] family history includes Cancer in her mother; Heart disease in her father.   ROS:   Please see the history of present illness.    ROS All other systems reviewed and are negative.   PHYSICAL EXAM:   VS:  BP 136/84   Pulse (!) 112   Ht 5' 7.5" (1.715 m)   Wt 219 lb (99.3  kg)   BMI 33.79 kg/m    GEN: Well nourished, well developed, in no acute distress  HEENT: normal  Neck: no JVD, carotid bruits, or masses Cardiac: irregularly irregular; no murmurs, rubs, or gallops,no edema  Respiratory:  Diminished breath sound bilaterally GI: soft, nontender, nondistended, + BS MS: no deformity or atrophy  Skin: warm and dry, no rash Neuro:  Alert and Oriented x 3, Strength and sensation are intact Psych: euthymic mood, full affect  Wt Readings from Last 3 Encounters:  06/01/17 219 lb (99.3 kg)  05/25/17 219 lb (99.3 kg)  02/08/17 218 lb (98.9 kg)      Studies/Labs Reviewed:   EKG:  EKG is ordered today.  The ekg ordered today demonstrates Atrial fibrillation with RVR, heart rate of 112, downsloping ST segment in the lateral leads.  Recent Labs: 01/30/2017: TSH 1.142 01/31/2017: BUN 24; Creatinine, Ser 1.04; Hemoglobin 15.1; Magnesium 1.9; Platelets 162; Potassium 3.6; Sodium 141   Lipid Panel    Component Value Date/Time   CHOL 116 11/12/2014 0432   TRIG 111 11/12/2014 0432   HDL 34 (L) 11/12/2014 0432   CHOLHDL 3.4 11/12/2014 0432   VLDL 22 11/12/2014 0432   LDLCALC 60 11/12/2014 0432    Additional studies/ records that were reviewed today include:   Cath 11/04/2014 IMPRESSION:  Moderate LV dysfunction secondary to ischemic cardiomyopathy with an ejection fraction of 35-40%.  Significant coronary obstructive disease with 80% focal distal left main stenosis and 80% stenosis in the AV groove circumflex vessel proximal to the OM1 takeoff; normal LAD and RCA vessels.  Insertion of temporary pacemaker for marked bradycardia with initial heart rates in the upper 30s.    Echo 01/11/2017 LV EF: 50% - 55%  Study Conclusions  - Left ventricle: Posterior lateral hypokinesis. The cavity size was normal. Systolic function was normal. The estimated ejection fraction was in the range of 50% to 55%. Left ventricular diastolic function  parameters were normal. - Aortic valve: There was mild regurgitation. - Mitral valve: There was mild regurgitation. - Left atrium: The atrium was moderately dilated. - Atrial septum: No defect or patent foramen ovale was identified.   ASSESSMENT:    1. Atrial fibrillation with RVR ([place])   2. Atypical chest pain   3. Tobacco abuse   4.  Coronary artery disease involving coronary bypass graft of native heart without angina pectoris      PLAN:  In order of problems listed above:  1. Atrial fibrillation with RVR: will increase diltiazem to 240 mg daily. Will refer the patient to electrophysiology given his history of significant posttermination pause.   2. Atypical chest pain: Resolved since last week. Will hold off on stress testing.  3. CAD s/p two-vessel CABG in 2016: Chest pain resolved.    Medication Adjustments/Labs and Tests Ordered: Current medicines are reviewed at length with the patient today.  Concerns regarding medicines are outlined above.  Medication changes, Labs and Tests ordered today are listed in the Patient Instructions below. Patient Instructions  Medication Instructions:   INCREASE Diltiazem to 240mg  DAILY  Labwork:   none  Testing/Procedures:  none  Follow-Up:  With Dr. Debara Pickett in 2-3 weeks.   If you need a refill on your cardiac medications before your next appointment, please call your pharmacy.      Hilbert Corrigan, [place]  06/02/2017 8:25 PM    [facility] [address], [place], [place]  [address] Phone: [phone]; Fax: [phone]

## 2017-06-02 ENCOUNTER — Encounter: Payer: Self-pay | Admitting: Physician Assistant

## 2017-06-22 ENCOUNTER — Encounter: Payer: Self-pay | Admitting: Internal Medicine

## 2017-06-22 ENCOUNTER — Ambulatory Visit: Payer: PPO | Admitting: Internal Medicine

## 2017-06-22 VITALS — BP 137/87 | HR 138 | Ht 67.5 in | Wt 214.2 lb

## 2017-06-22 DIAGNOSIS — I255 Ischemic cardiomyopathy: Secondary | ICD-10-CM

## 2017-06-22 DIAGNOSIS — Z951 Presence of aortocoronary bypass graft: Secondary | ICD-10-CM

## 2017-06-22 DIAGNOSIS — I4891 Unspecified atrial fibrillation: Secondary | ICD-10-CM

## 2017-06-22 MED ORDER — DILTIAZEM HCL ER COATED BEADS 120 MG PO CP24: 120.0000 mg | ORAL_CAPSULE | Freq: Every day | ORAL | 1 refills | Status: DC

## 2017-06-22 MED ORDER — CARVEDILOL 12.5 MG PO TABS: 12.5000 mg | ORAL_TABLET | Freq: Two times a day (BID) | ORAL | 1 refills | Status: DC

## 2017-06-22 NOTE — Progress Notes (Signed)
OFFICE NOTE  Chief Complaint:  Follow-up afib  Primary Care Physician: [name], MD  HPI:  LAURI PURDUM is a pleasant 71 year old female who I recently saw the hospital when she was in the cardiac care unit. She has a long standing history of Hypertension and tobacco abuse. She was admitted to [facility] 10/29/2014 with a several week history of worsening symptoms of shortness of breath, chest tightness, fatigue, and URI symptoms. She was initially evaluated by her PCP at which time she was noted to be in Atrial Fibrillation with RVR. She was sent to the emergency department for evaluation. EKG confirmed Atrial Fibrillation, Troponin was negative. CXR was obtained and was concerning for pneumonia and she was started on empiric ABX. She also underwent TTE which showed a reduced EF of 35-40%. She had diffuse global hypokinesis especially of the inferior wall. It was felt the patient should undergo cardiac catheterization for further diagnostics, however patient became anxious and would not consent to the procedure. The patient calmed down and in a few days and further consultation she was agreeable to proceed with cardiac catheterization. This was to be done on 11/03/2014 however, the prior to the procedure the patient became agitated, combativeness and shortness of breath. She progressed to full cardiac arrest with asystole without any preceding signs of symptoms. She was successful resuscitated but showed signs of decerebrate posturing and was placed on the cooling protocol. During cooling she continued to have Atrial Fibrillation which progressed to bradycardia. It was again decided she should undergo cardiac catheterization. This was completed on 11/04/2014 and revealed significant coronary artery disease. It was felt coronary bypass grafting would be the treatment of choice and TCTS was consulted. She was evaluated by Dr. Roxy Manns who agreed at some point she would benefit  from Coronary Bypass grafting procedure. However at the time of evaluation the patient remained on a ventilator and was sedated without signs of neurologic recovery. It was felt medical therapy should be continued at this time and if patient awakes and returns to baseline surgery could be reconsidered at that point.The patient was treated medically for several days. She was successfully weaned and later extubated. She continued to have issues with Atrial Fibrillation. She was again evaluated by Dr. Roxy Manns on 11/11/2014 at which time he felt she was medically stable to proceed with Coronary Bypass grafting procedure. The risks and benefits of the procedure were explained to the patient and she was agreeable to proceed. She was taken to the operating room on 11/14/2014. She underwent CABG x 2 utilizing LIMA to LAD, SVG to OM. She also underwent clipping of her Left Atrial Appendage and endoscopic saphenous vein harvest from her right thigh. She tolerated the procedure well and was taken to the SICU in stable condition. The patient was extubated the evening of surgery. During her stay in the SICU she developed Rapid Atrial Fibrillation. She was treated with Amiodarone and Lopressor, but ultimately did not convert to NSR. EP consult was requested and it was felt cardioversion could be attempted. This was completed on POD # 4 and was unsuccessful. However, the patient ultimately converted to NSR on her own. She remains on Lopressor, which has been titrated as tolerated. She was also placed on Eliquis for stroke prevention.  She returns today for follow-up and is feeling quite well. She denies any chest pain or worsening shortness of breath. She is interesting in getting off of amiodarone completely. She seems to be maintaining sinus rhythm  at a rate of 55 with occasional PVCs.  Mrs. Rozeboom returns today for follow-up. She denies any chest pain but is reporting some tenderness and heaviness in the left  breast. She feels that it somewhat enlarged and feels heavier than the right side. It was before thought that her pain in the left chest may be related to her sternotomy however her symptoms are worsening. She denies any nipple discharge or skin changes in that area. She had been struggling with a Candida intertrigo which was successfully treated with topical medications. In addition she has some chronic back pain which I think is unrelated. Recently she saw a nurse practitioner and was placed on carvedilol as an alternative to her metoprolol. She seems to be tolerating this fairly well. Heart rate is now higher than it had been previously.  02/08/2016  Mrs. Vassel returns today for follow-up. At her last office visit she was complaining of some breast heaviness and pain and I ordered a mammogram which was negative for any malignancy or masses. She appears to have had resolution of her Candida intertrigo and I've indicated that she could use baby powder or a dry antiperspirant to help with sweating and itching. She continues to have some complaints of numbness and tingling the go down both arms which could be arising from the shoulders or neck. I will refer to orthopedics for this. In addition she reports some shortness of breath and weight gain which she thinks is related to medicines however it seems to be more related to appetite and decreased exercise. She is also back off on her smoking some but has not yet been able to quit.  05/19/2016  Mrs. Desouza is seen today in follow-up. She has a number of unusual complaints today. Most recently however she was placed on a number of medications for blood pressure from her primary care provider which were different for the medications we have listed for her. She noted some side effects from these and says that she stopped taking them and resume taking the medications that she is currently on. Of note she has lost 7 pounds since her last office visit. She denies any  worsening shortness of breath or chest pain. She is due for repeat echo for repeat assessment of her cardiomyopathy.  12/26/2016  Mrs. Wojdyla was seen today for follow-up. She recently notify the office she wanted to switch cardiologist because she was looking for a female cardiologist that could help her with her hot flashes. She says she breaks out in a sweat for no reason. She's also been having problems with abdominal discomfort. Her ejection fraction actually has improved significantly recently by echo from 35-40% up to 50-55% in October 2017. Despite this, she reports she's been short of breath recently. Blood pressure remains elevated on concerned about the medication compliance.  02/08/2017  Nicholette returns for follow-up she had a repeat echo which was scheduled that showed stable LVEF of 50-55%. Unfortunately, she was briefly admitted for a-fib with RVR. She was started on diltiazem and converted overnight. Seen by [name] who spoke with me to confirm since she has had surgical closure of the LAA she does not require anticoagulation. She is on aspirin for CAD. She was not discharged on additional medication. Since she responded to CCB, I will start a low dose to help prevent recurrence. She is still concerned about dysphagia, particularly to solid foods and bloating. She takes ranitidine.  06/22/2017  I will was seen today in follow-up.  Recently she was noted to have A. fib with RVR.  She saw Almyra Deforest, PA-C, who further increased her diltiazem to 240 mg daily.  She says she is not tolerated this, that she broke out in a rash and had candidal type intertrigo under her breasts.  She feels like this is all related to the increased dose of the diltiazem and therefore discontinued the medicine.  She reports taking her carvedilol twice a day and this would explain why her A. fib rate is uncontrolled today at 138 bpm.  Despite this she is asymptomatic.  PMHx:  Past Medical History:  Diagnosis Date    . Acute on chronic combined systolic and diastolic HF (heart failure) ([place]) 10/30/2014   EF 35-40% with RWMA   . Arthritis   . Asthma   . Atrial fibrillation with RVR ([place])   . Cardiomyopathy, ischemic   . Hypertension   . Left main coronary artery disease 11/04/2014  . S/P CABG x 2 with clipping of LA appendage 11/14/2014   LIMA to LAD, SVG to OM, EVH via right thigh    No past surgical history on file.  FAMHx:  Family History  Problem Relation Age of Onset  . Cancer Mother   . Heart disease Father     SOCHx:   reports that she has been smoking cigarettes.  She has a 32.00 pack-year smoking history. she has never used smokeless tobacco. She reports that she does not drink alcohol or use drugs.  ALLERGIES:  Allergies  Allergen Reactions  . Bee Venom Anaphylaxis  . Codeine Nausea And Vomiting  . Shrimp [Shellfish Allergy] Swelling  . Tomato     Pt states it gives her gout    ROS: Pertinent items noted in HPI and remainder of comprehensive ROS otherwise negative.  HOME MEDS: Current Outpatient Medications  Medication Sig Dispense Refill  . acetaminophen (TYLENOL) 500 MG tablet Take 500 mg by mouth every 6 (six) hours as needed for moderate pain.     Marland Kitchen aspirin EC 81 MG EC tablet Take 1 tablet (81 mg total) by mouth daily.    Marland Kitchen atorvastatin (LIPITOR) 10 MG tablet TAKE 1 TABLET EVERY DAY  AT  6PM 90 tablet 3  . carvedilol (COREG) 6.25 MG tablet Take 1 tablet (6.25 mg total) by mouth 2 (two) times daily. 180 tablet 3  . diltiazem (CARDIZEM CD) 240 MG 24 hr capsule Take 1 capsule (240 mg total) by mouth daily. 30 capsule 5  . ranitidine (ZANTAC) 150 MG capsule Take 150 mg by mouth 2 (two) times daily.    Marland Kitchen triamterene-hydrochlorothiazide (DYAZIDE) 37.5-25 MG capsule Take 1 capsule by mouth daily.    Marland Kitchen [name] 40 MG tablet Take 20 mg by mouth daily as needed (gout flare up).      No current facility-administered medications for this visit.     LABS/IMAGING: No results found  for this or any previous visit (from the past 48 hour(s)). No results found.  WEIGHTS: Wt Readings from Last 3 Encounters:  06/22/17 214 lb 3.2 oz (97.2 kg)  06/01/17 219 lb (99.3 kg)  05/25/17 219 lb (99.3 kg)    VITALS: BP 137/87   Pulse (!) 138   Ht 5' 7.5" (1.715 m)   Wt 214 lb 3.2 oz (97.2 kg)   BMI 33.05 kg/m   EXAM: General appearance: alert, no distress and mildly obese Neck: no carotid bruit and no JVD Lungs: clear to auscultation bilaterally Heart: irregularly irregular rhythm and Tachycardic  Abdomen: soft, non-tender; bowel sounds normal; no masses,  no organomegaly Extremities: extremities normal, atraumatic, no cyanosis or edema Pulses: 2+ and symmetric Skin: Skin color, texture, turgor normal. No rashes or lesions Neurologic: Grossly normal Psych: Unusual affect  EKG: A. fib with RVR at 138, lateral ST and T wave changes-personally reviewed  ASSESSMENT: 1. PAF-remains in RVR, noncompliance with diltiazem 2. Coronary artery disease status post CABG 2 with LIMA to LAD and SVG to OM 3. Acute on chronic combined systolic congestive heart failure, EF 35-40% (improved to 50-55% - 5/18) 4. Paroxysmal atrial fibrillation-CHADSVASC 4, - status post left atrial closure (not on anticoagulation) 5. Dyslipidemia 6. Hypertension - uncontrolled 7. Dyspnea on exertion 8. Dysphasia to solid foods, bloating  PLAN: 1.   Mrs. Wallenstein remains in A. fib with RVR despite starting medications about a month ago.  She stopped taking her diltiazem due to side effects, or should as a perceived side effects.  Nonetheless, I am recommending that we go back on her lower dose diltiazem 120 mg daily and increase her carvedilol more to 12.5 mg twice a day.  Plan follow-up with me in a month.  [name], MD, [facility] Attending Cardiologist [place] 06/22/2017, 3:28 PM

## 2017-06-22 NOTE — Patient Instructions (Addendum)
Your physician has recommended you make the following change in your medication:  -- TAKE diltiazem 120mg  daily (this is a decrease in dose) -- INCREASE carvedilol to 12.5mg  twice daily  Your physician recommends that you schedule a follow-up appointment in: ONE MONTH

## 2017-06-23 ENCOUNTER — Other Ambulatory Visit: Payer: Self-pay | Admitting: Internal Medicine

## 2017-06-23 ENCOUNTER — Ambulatory Visit
Admission: RE | Admit: 2017-06-23 | Discharge: 2017-06-23 | Disposition: A | Discharge status: Home / Self Care | Payer: PPO | Source: Ambulatory Visit | Attending: Internal Medicine | Admitting: Internal Medicine

## 2017-06-23 DIAGNOSIS — R0602 Shortness of breath: Secondary | ICD-10-CM | POA: Diagnosis not present

## 2017-06-23 DIAGNOSIS — R0609 Other forms of dyspnea: Secondary | ICD-10-CM | POA: Diagnosis not present

## 2017-06-23 DIAGNOSIS — M549 Dorsalgia, unspecified: Secondary | ICD-10-CM

## 2017-06-23 DIAGNOSIS — R3915 Urgency of urination: Secondary | ICD-10-CM | POA: Diagnosis not present

## 2017-06-23 DIAGNOSIS — N939 Abnormal uterine and vaginal bleeding, unspecified: Secondary | ICD-10-CM | POA: Diagnosis not present

## 2017-06-23 DIAGNOSIS — M67479 Ganglion, unspecified ankle and foot: Secondary | ICD-10-CM | POA: Diagnosis not present

## 2017-06-23 DIAGNOSIS — N898 Other specified noninflammatory disorders of vagina: Secondary | ICD-10-CM | POA: Diagnosis not present

## 2017-06-23 DIAGNOSIS — Z23 Encounter for immunization: Secondary | ICD-10-CM | POA: Diagnosis not present

## 2017-06-23 DIAGNOSIS — F17210 Nicotine dependence, cigarettes, uncomplicated: Secondary | ICD-10-CM | POA: Diagnosis not present

## 2017-07-10 DIAGNOSIS — I4891 Unspecified atrial fibrillation: Secondary | ICD-10-CM | POA: Diagnosis not present

## 2017-07-10 DIAGNOSIS — F411 Generalized anxiety disorder: Secondary | ICD-10-CM | POA: Diagnosis not present

## 2017-07-10 DIAGNOSIS — N939 Abnormal uterine and vaginal bleeding, unspecified: Secondary | ICD-10-CM | POA: Diagnosis not present

## 2017-07-10 DIAGNOSIS — R000 Tachycardia, unspecified: Secondary | ICD-10-CM | POA: Diagnosis not present

## 2017-07-12 ENCOUNTER — Telehealth: Payer: Self-pay | Admitting: Internal Medicine

## 2017-07-12 NOTE — Telephone Encounter (Signed)
Received incoming records from [facility] for upcoming appointment on 07/27/17 @ 1:45pm with Dr. Debara Pickett. Records given to [facility] in Medical Records. 07/12/17 ab

## 2017-07-18 ENCOUNTER — Telehealth: Payer: Self-pay | Admitting: Internal Medicine

## 2017-07-18 NOTE — Progress Notes (Signed)
Cardiology Office Note    Date:  07/19/2017   ID:  Bennetta, Rudden 1945-09-25, MRN [id number]  PCP:  [name], MD  Cardiologist: Dr. Debara Pickett  Chief Complaint  Patient presents with  . Follow-up    Dyspnea on Exertion; Palpitations    History of Present Illness:    [name] [name] is a 71 y.o. female with past medical history of CAD (s/p CABG in 2016 with LIMA-LAD and SVG-OM), PAF (s/p clipping of left atrial appendage), chronic combined systolic and diastolic CHF (EF previously 35-40%, improved to 50-55% by echo in 12/2016), HTN, HLD, and arthritis who presents to the office today for evaluation of worsening dyspnea.   She was recently evaluated by Dr. Debara Pickett on 06/22/2017 and reported having a rash under her breasts following titration of her Cardizem to 240mg  daily in the setting of atrial fibrillation with RVR. She therefore discontinued the medication and her HR was elevated at 138 bpm at the time of her visit but she was asymptomatic with this. She was restarted on a lower dose of Diltiazem at 120mg  daily and Coreg was titrated to 12.5mg  BID.   She called the office on 07/18/2017 reporting worsening dyspnea, therefore a close follow-up appointment was arranged.  In talking with the patient today, she reports having episodes of palpitations and dyspnea on exertion for the past few months. As above, her Cardizem dosing was increased recently but she had symptoms with this, therefore she was placed on her previous dose of 120 mg daily. Since this adjustment, she reports having significant diarrhea after taking the medication. She does take this with food and has not noticed any improvement with her symptoms. She reports compliance with the medication but I am concerned as she previously self-discontinued the medication in the past. She denies any symptoms with the dose adjustment of her Coreg. She does not check her HR or BP at home as she does not have a BP cuff or pulse oximeter but HR  is elevated in the 110's at today's visit.   She denies any recent chest discomfort, orthopnea, PND, or lower extremity edema.   Towards the end of today's encounter, the patient requested we talk to her daughter on speaker phone to review her changes. Her daughter voiced significant dissatisfaction in her mother's care since her surgery as she does not understand why her mother is still having episodes of atrial fibrillation and dyspnea. We reviewed the pathophysiology behind this and that medication adjustments were being made.  Past Medical History:  Diagnosis Date  . Acute on chronic combined systolic and diastolic HF (heart failure) ([place]) 10/30/2014   EF 35-40% with RWMA   . Arthritis   . Asthma   . Atrial fibrillation with RVR ([place])   . Cardiomyopathy, ischemic   . Hypertension   . Left main coronary artery disease 11/04/2014  . S/P CABG x 2 with clipping of LA appendage 11/14/2014   LIMA to LAD, SVG to OM, EVH via right thigh    Past Surgical History:  Procedure Laterality Date  . CARDIOVERSION N/A 11/18/2014   Procedure: CARDIOVERSION;  Surgeon: [name], MD;  Location: [facility];  Service: Cardiovascular;  Laterality: N/A;  . CLIPPING OF ATRIAL APPENDAGE N/A 11/14/2014   Procedure: CLIPPING OF ATRIAL APPENDAGE;  Surgeon: [name], MD;  Location: [place];  Service: Open Heart Surgery;  Laterality: N/A;  . CORONARY ARTERY BYPASS GRAFT N/A 11/14/2014   Procedure: CORONARY ARTERY BYPASS GRAFTING (CABG)TIMES  2 USING LEFT INTERNAL MAMMARY ARTERY AND RIGHT SAPHENOUS VEIN HARVESTED ENDOSCOPICALLY;  Surgeon: [name], MD;  Location: [facility];  Service: Open Heart Surgery;  Laterality: N/A;  . LEFT HEART CATHETERIZATION WITH CORONARY ANGIOGRAM N/A 11/04/2014   Procedure: LEFT HEART CATHETERIZATION WITH CORONARY ANGIOGRAM;  Surgeon: Troy Sine, MD;  Location: [facility] CATH LAB;  Service: Cardiovascular;  Laterality: N/A;  . TEE WITHOUT CARDIOVERSION N/A 11/14/2014   Procedure:  TRANSESOPHAGEAL ECHOCARDIOGRAM (TEE);  Surgeon: [name], MD;  Location: [facility];  Service: Open Heart Surgery;  Laterality: N/A;  . TEMPORARY PACEMAKER INSERTION  11/04/2014   Procedure: TEMPORARY PACEMAKER INSERTION;  Surgeon: Troy Sine, MD;  Location: [facility] CATH LAB;  Service: Cardiovascular;;    Current Medications: Outpatient Medications Prior to Visit  Medication Sig Dispense Refill  . acetaminophen (TYLENOL) 500 MG tablet Take 500 mg by mouth every 6 (six) hours as needed for moderate pain.     Marland Kitchen aspirin EC 81 MG EC tablet Take 1 tablet (81 mg total) by mouth daily.    Marland Kitchen atorvastatin (LIPITOR) 10 MG tablet TAKE 1 TABLET EVERY DAY  AT  6PM 90 tablet 3  . ranitidine (ZANTAC) 150 MG capsule Take 150 mg by mouth 2 (two) times daily.    Marland Kitchen triamterene-hydrochlorothiazide (DYAZIDE) 37.5-25 MG capsule Take 1 capsule by mouth daily.    Marland Kitchen [name] 40 MG tablet Take 20 mg by mouth daily as needed (gout flare up).     . carvedilol (COREG) 12.5 MG tablet Take 1 tablet (12.5 mg total) 2 (two) times daily by mouth. 180 tablet 1  . diltiazem (CARDIZEM CD) 120 MG 24 hr capsule Take 1 capsule (120 mg total) daily by mouth. 90 capsule 1   No facility-administered medications prior to visit.      Allergies:   Bee venom; Codeine; Shrimp [shellfish allergy]; and Tomato   Social History   Socioeconomic History  . Marital status: Widowed    Spouse name: None  . Number of children: None  . Years of education: None  . Highest education level: None  Social Needs  . Financial resource strain: None  . Food insecurity - worry: None  . Food insecurity - inability: None  . Transportation needs - medical: None  . Transportation needs - non-medical: None  Occupational History  . None  Tobacco Use  . Smoking status: Current Some Day Smoker    Packs/day: 1.00    Years: 32.00    Pack years: 32.00    Types: Cigarettes  . Smokeless tobacco: Never Used  . Tobacco comment: occasional puff here and  there - 02/09/15  Substance and Sexual Activity  . Alcohol use: No    Alcohol/week: 0.0 oz  . Drug use: No  . Sexual activity: None  Other Topics Concern  . None  Social History Narrative  . None     Family History:  [name] family history includes Cancer in her mother; Heart disease in her father.   Review of Systems:   Please see the history of present illness.     General:  No chills, fever, night sweats or weight changes.  Cardiovascular:  No chest pain, edema, orthopnea, paroxysmal nocturnal dyspnea. Positive for palpitations and dyspnea on exertion.  Dermatological: No rash, lesions/masses Respiratory: No cough, dyspnea Urologic: No hematuria, dysuria Abdominal:   No nausea, vomiting, diarrhea, bright red blood per rectum, melena, or hematemesis Neurologic:  No visual changes, wkns, changes in mental status. All other  systems reviewed and are otherwise negative except as noted above.   Physical Exam:    VS:  BP 132/72   Pulse (!) 113   Ht 5' 7.5" (1.715 m)   Wt 218 lb 9.6 oz (99.2 kg)   BMI 33.73 kg/m    General: Well developed, well nourished [place] American female appearing in no acute distress. Head: Normocephalic, atraumatic, sclera non-icteric, no xanthomas, nares are without discharge.  Neck: No carotid bruits. JVD not elevated.  Lungs: Respirations regular and unlabored, without wheezes or rales.  Heart: Irregularly irregular. No S3 or S4.  No murmur, no rubs, or gallops appreciated. Abdomen: Soft, non-tender, non-distended with normoactive bowel sounds. No hepatomegaly. No rebound/guarding. No obvious abdominal masses. Msk:  Strength and tone appear normal for age. No joint deformities or effusions. Extremities: No clubbing or cyanosis. No lower extremity edema.  Distal pedal pulses are 2+ bilaterally. Neuro: Alert and oriented X 3. Moves all extremities spontaneously. No focal deficits noted. Psych:  Responds to questions appropriately with a normal  affect. Skin: No rashes or lesions noted  Wt Readings from Last 3 Encounters:  07/19/17 218 lb 9.6 oz (99.2 kg)  06/22/17 214 lb 3.2 oz (97.2 kg)  06/01/17 219 lb (99.3 kg)     Studies/Labs Reviewed:   EKG:  EKG is ordered today. The ekg ordered today demonstrates atrial fibrillation with RVR, HR 113, with occasional PVC's.   Recent Labs: 01/30/2017: TSH 1.142 01/31/2017: BUN 24; Creatinine, Ser 1.04; Hemoglobin 15.1; Magnesium 1.9; Platelets 162; Potassium 3.6; Sodium 141   Lipid Panel    Component Value Date/Time   CHOL 116 11/12/2014 0432   TRIG 111 11/12/2014 0432   HDL 34 (L) 11/12/2014 0432   CHOLHDL 3.4 11/12/2014 0432   VLDL 22 11/12/2014 0432   LDLCALC 60 11/12/2014 0432    Additional studies/ records that were reviewed today include:   Echocardiogram: 12/2016 Study Conclusions  - Left ventricle: Posterior lateral hypokinesis. The cavity size   was normal. Systolic function was normal. The estimated ejection   fraction was in the range of 50% to 55%. Left ventricular   diastolic function parameters were normal. - Aortic valve: There was mild regurgitation. - Mitral valve: There was mild regurgitation. - Left atrium: The atrium was moderately dilated. - Atrial septum: No defect or patent foramen ovale was identified.  Assessment:    1. Persistent atrial fibrillation ([place])   2. Coronary artery disease involving coronary bypass graft of native heart without angina pectoris   3. Chronic combined systolic and diastolic CHF (congestive heart failure) ([place])   4. Essential hypertension      Plan:   In order of problems listed above:  1. Persistent Atrial Fibrillation  - the patient has a known history of atrial fibrillation and has been struggling with elevated HR recently. Cardizem was initially titrated to 240mg  daily but she developed a rash and this was therefore reduced back to 120mg  daily. While she was previously on this dose without symptoms, she  reports now having diarrhea within hours of taking the medication and requests an alternative.  - HR remains elevated in the 110's during today's visit.  - will titrate Coreg to 18.75mg  BID (slower dose adjustment as she does not have a way to monitor HR and BP at home). She reports being intolerant to Cardizem as outlined above, therefore will discontinue this and switch to Verapamil 120mg  daily. Can further titrate Verapamil and/or Coreg as needed and BP allows.  - no  longer on anticoagulation as she underwent clipping of the LAA at the time of her CABG. Remains on ASA.   2. CAD - s/p CABG in 2016 with LIMA-LAD and SVG-OM.  - she denies any chest pain on exertion. Has been experiencing dyspnea in the setting of her elevated HR.  - EKG shows no acute ischemic changes.  - continue ASA, statin, and BB therapy.   3. Chronic combined systolic and diastolic CHF - EF previously 35-40%, improved to 50-55% by echo in 12/2016.  - she does not appear volume overloaded by physical examination.  - continue Coreg with dose adjustments as above along with Triamterene-HCTZ.   4. HTN - BP is well-controlled at 132/72 during today's visit.  - continue Triamterene-HCTZ with dose adjustments of Coreg and initiation of Verapamil as above. I encouraged the patient to purchase a BP cuff if possible and follow BP along with HR at home.    Medication Adjustments/Labs and Tests Ordered: Current medicines are reviewed at length with the patient today.  Concerns regarding medicines are outlined above.  Medication changes, Labs and Tests ordered today are listed in the Patient Instructions below. Patient Instructions  Medication Instructions:  INCREASE CARVEDILOL TO 1.5 TABLETS (18.75MG )  TWICE DAILY  STOP DILTIAZEM  START VERAPAMIL 120MG  DAILY If you need a refill on your cardiac medications before your next appointment, please call your pharmacy.  Follow-Up: Your physician wants you to follow-up on:  08-10-2017 AT 1130 AM WITH Bernerd Pho, PA-C.   Thank you for choosing [facility] at [employer], Erma Heritage, PA-C  07/19/2017 8:29 PM    [address], [place], [place]  [address] Phone: [phone]; Fax: [phone]  [address], [place], [place] [address] Phone: [phone]

## 2017-07-18 NOTE — Telephone Encounter (Signed)
Pt c/o Shortness Of Breath: STAT if SOB developed within the last 24 hours or pt is noticeably SOB on the phone  1. Are you currently SOB (can you hear that pt is SOB on the phone)? yes  2. How long have you been experiencing SOB? 2wks   3. Are you SOB when sitting or when up moving around? Both   4. Are you currently experiencing any other symptoms? no

## 2017-07-18 NOTE — Telephone Encounter (Signed)
Returned call to patient of Dr. Debara Pickett who reports progressive SOB. She reports "something from (my) stomach on up" and "I'm not breathing right". Reports some chest pain but "nothing to worry about". Denies weight gain. Denies swelling. Does not monitor home BP or HR. Reports she feels her heart may be racing. She reports compliance with her meds. Routed to MD as FYI  Patient scheduled to see B. Strader, 07/19/17 @ 2pm

## 2017-07-18 NOTE — Telephone Encounter (Signed)
Thanks for letting me know.  Dr. H 

## 2017-07-19 ENCOUNTER — Encounter: Payer: Self-pay | Admitting: Student

## 2017-07-19 ENCOUNTER — Ambulatory Visit: Payer: PPO | Admitting: Student

## 2017-07-19 VITALS — BP 132/72 | HR 113 | Ht 67.5 in | Wt 218.6 lb

## 2017-07-19 DIAGNOSIS — I5042 Chronic combined systolic (congestive) and diastolic (congestive) heart failure: Secondary | ICD-10-CM | POA: Diagnosis not present

## 2017-07-19 DIAGNOSIS — I481 Persistent atrial fibrillation: Secondary | ICD-10-CM | POA: Diagnosis not present

## 2017-07-19 DIAGNOSIS — I4819 Other persistent atrial fibrillation: Secondary | ICD-10-CM

## 2017-07-19 DIAGNOSIS — I10 Essential (primary) hypertension: Secondary | ICD-10-CM

## 2017-07-19 DIAGNOSIS — I25810 Atherosclerosis of coronary artery bypass graft(s) without angina pectoris: Secondary | ICD-10-CM | POA: Diagnosis not present

## 2017-07-19 MED ORDER — VERAPAMIL HCL ER 120 MG PO TBCR: 120.0000 mg | EXTENDED_RELEASE_TABLET | Freq: Every day | ORAL | 3 refills | Status: DC

## 2017-07-19 MED ORDER — CARVEDILOL 12.5 MG PO TABS: 18.7500 mg | ORAL_TABLET | Freq: Two times a day (BID) | ORAL | 1 refills | Status: DC

## 2017-07-19 NOTE — Patient Instructions (Addendum)
Medication Instructions:  INCREASE CARVEDILOL 1.5 TABLETS (18.75MG ) TWICE DAILY  STOP DILTIAZEM  START VERAPAMIL 120MG  DAILY If you need a refill on your cardiac medications before your next appointment, please call your pharmacy.  Follow-Up: Your physician wants you to follow-up on: 08-10-2017 AT 1130 AM WITH Bernerd Pho, PA-C.   Thank you for choosing [facility] at [facility]!!

## 2017-07-20 ENCOUNTER — Telehealth: Payer: Self-pay | Admitting: Student

## 2017-07-20 NOTE — Telephone Encounter (Signed)
Pt said pill that [place] gave her yesterday(she did not know the name of it)interacted with the  other pill she was already on. She said she think she should wait a day or so before she starts the new medicine.

## 2017-07-20 NOTE — Telephone Encounter (Signed)
Unable to reach pt or leave a message  

## 2017-07-22 ENCOUNTER — Emergency Department (HOSPITAL_COMMUNITY): Payer: PPO

## 2017-07-22 ENCOUNTER — Encounter (HOSPITAL_COMMUNITY): Payer: Self-pay

## 2017-07-22 ENCOUNTER — Other Ambulatory Visit: Payer: Self-pay

## 2017-07-22 ENCOUNTER — Emergency Department (HOSPITAL_COMMUNITY)
Admission: EM | Admit: 2017-07-22 | Discharge: 2017-07-22 | Disposition: A | Discharge status: Home / Self Care | Payer: PPO | Attending: Emergency Medicine | Admitting: Emergency Medicine

## 2017-07-22 DIAGNOSIS — I5042 Chronic combined systolic (congestive) and diastolic (congestive) heart failure: Secondary | ICD-10-CM | POA: Insufficient documentation

## 2017-07-22 DIAGNOSIS — I2510 Atherosclerotic heart disease of native coronary artery without angina pectoris: Secondary | ICD-10-CM | POA: Diagnosis not present

## 2017-07-22 DIAGNOSIS — N183 Chronic kidney disease, stage 3 (moderate): Secondary | ICD-10-CM | POA: Insufficient documentation

## 2017-07-22 DIAGNOSIS — Z7982 Long term (current) use of aspirin: Secondary | ICD-10-CM | POA: Diagnosis not present

## 2017-07-22 DIAGNOSIS — J45909 Unspecified asthma, uncomplicated: Secondary | ICD-10-CM | POA: Diagnosis not present

## 2017-07-22 DIAGNOSIS — Z79899 Other long term (current) drug therapy: Secondary | ICD-10-CM | POA: Diagnosis not present

## 2017-07-22 DIAGNOSIS — I130 Hypertensive heart and chronic kidney disease with heart failure and stage 1 through stage 4 chronic kidney disease, or unspecified chronic kidney disease: Secondary | ICD-10-CM | POA: Diagnosis not present

## 2017-07-22 DIAGNOSIS — I252 Old myocardial infarction: Secondary | ICD-10-CM | POA: Insufficient documentation

## 2017-07-22 DIAGNOSIS — I481 Persistent atrial fibrillation: Secondary | ICD-10-CM | POA: Diagnosis not present

## 2017-07-22 DIAGNOSIS — I4819 Other persistent atrial fibrillation: Secondary | ICD-10-CM

## 2017-07-22 DIAGNOSIS — Z951 Presence of aortocoronary bypass graft: Secondary | ICD-10-CM | POA: Diagnosis not present

## 2017-07-22 DIAGNOSIS — I4891 Unspecified atrial fibrillation: Secondary | ICD-10-CM | POA: Diagnosis not present

## 2017-07-22 DIAGNOSIS — R0602 Shortness of breath: Secondary | ICD-10-CM | POA: Diagnosis present

## 2017-07-22 DIAGNOSIS — F17210 Nicotine dependence, cigarettes, uncomplicated: Secondary | ICD-10-CM | POA: Insufficient documentation

## 2017-07-22 DIAGNOSIS — I499 Cardiac arrhythmia, unspecified: Secondary | ICD-10-CM | POA: Diagnosis not present

## 2017-07-22 DIAGNOSIS — R079 Chest pain, unspecified: Secondary | ICD-10-CM | POA: Diagnosis not present

## 2017-07-22 LAB — BASIC METABOLIC PANEL
Anion gap: 7 (ref 5–15)
BUN: 27 mg/dL — ABNORMAL HIGH (ref 6–20)
CO2: 19 mmol/L — ABNORMAL LOW (ref 22–32)
Calcium: 7.6 mg/dL — ABNORMAL LOW (ref 8.9–10.3)
Chloride: 112 mmol/L — ABNORMAL HIGH (ref 101–111)
Creatinine, Ser: 1.5 mg/dL — ABNORMAL HIGH (ref 0.44–1.00)
GFR calc Af Amer: 39 mL/min — ABNORMAL LOW (ref >60)
GFR calc non Af Amer: 34 mL/min — ABNORMAL LOW (ref >60)
Glucose, Bld: 149 mg/dL — ABNORMAL HIGH (ref 65–99)
Potassium: 4 mmol/L (ref 3.5–5.1)
Sodium: 138 mmol/L (ref 135–145)

## 2017-07-22 LAB — CBC
HCT: 41.1 % (ref 36.0–46.0)
Hemoglobin: 13.8 g/dL (ref 12.0–15.0)
MCH: 29.7 pg (ref 26.0–34.0)
MCHC: 33.6 g/dL (ref 30.0–36.0)
MCV: 88.4 fL (ref 78.0–100.0)
Platelets: 122 10*3/uL — ABNORMAL LOW (ref 150–400)
RBC: 4.65 MIL/uL (ref 3.87–5.11)
RDW: 16.9 % — ABNORMAL HIGH (ref 11.5–15.5)
WBC: 11.4 10*3/uL — ABNORMAL HIGH (ref 4.0–10.5)

## 2017-07-22 LAB — BRAIN NATRIURETIC PEPTIDE: B Natriuretic Peptide: 619.1 pg/mL — ABNORMAL HIGH (ref 0.0–100.0)

## 2017-07-22 NOTE — ED Provider Notes (Signed)
[facility] EMERGENCY DEPARTMENT Provider Note   CSN: [id number] Arrival date & time: 07/22/17  1854     History   Chief Complaint Chief Complaint  Patient presents with  . Weakness    HPI [name] [name] is a 71 y.o. female.  Patient call originally was for chest pain.  The patient denied chest pain upon arrival.  Stated that she was having a weak feeling with some shortness of breath some mild nausea and some feelings of palpitations.  Patient has a known history of atrial fibrillation followed by cardiology.  Seen just on December 5 with some medication adjustments.  Initially patient seemed to be confused about what meds she is supposed to be taking.  After family members arrived and some close scrutiny.  It was determined that she has had 2 new medications added one was Coreg supposed to get twice a day.  Took the first dose today.  And the other new medicine was verapamil SR and she is post to take that at bedtime which she took that at the same time that she took the [place].  EMS reported some low blood pressures.  But upon arrival here blood pressures were systolic of 678.  Patient's heart rate was anywhere from 90-150 by EMS.  Cardiac monitoring here heart rate was around 120-100.  Review of the cardiology notes show that they are working to to help control her atrial fibrillation by slowly making adjustments in medications.  They were concerned about possible hypotension occurring.  So they are making the adjustments and some small increments.      Past Medical History:  Diagnosis Date  . Acute on chronic combined systolic and diastolic HF (heart failure) ([place]) 10/30/2014   EF 35-40% with RWMA   . Arthritis   . Asthma   . Atrial fibrillation with RVR ([place])   . Cardiomyopathy, ischemic   . Hypertension   . Left main coronary artery disease 11/04/2014  . S/P CABG x 2 with clipping of LA appendage 11/14/2014   LIMA to LAD, SVG to OM, EVH via right thigh     Patient Active Problem List   Diagnosis Date Noted  . Paroxysmal atrial fibrillation ([place]) 02/08/2017  . Cardiomyopathy, ischemic 02/08/2017  . Dysphagia 02/08/2017  . CKD (chronic kidney disease), stage III ([place]) 01/30/2017  . Pain in shoulder 02/08/2016  . Breast pain, left 02/08/2016  . Bilateral arm numbness and tingling while sleeping 02/08/2016  . Painful lumpy left breast 09/23/2015  . [place] intertrigo 02/11/2015  . S/P CABG x 2 11/14/2014  . Accelerated hypertension   . Cardiac arrest ([place]) 11/04/2014  . Left main coronary artery disease 11/04/2014  . Coronary artery disease due to lipid rich plaque   . Acute respiratory failure with hypoxemia ([place])   . Essential hypertension   . Atrial fibrillation with RVR ([place])   . Hypokalemia 10/30/2014  . Chronic combined systolic and diastolic CHF (congestive heart failure) ([place]) 10/30/2014  . NSTEMI (non-ST elevated myocardial infarction) ([place])   . DOE (dyspnea on exertion)   . CAP (community acquired pneumonia) 10/29/2014    Past Surgical History:  Procedure Laterality Date  . CARDIOVERSION N/A 11/18/2014   Procedure: CARDIOVERSION;  Surgeon: [name], MD;  Location: [place] [facility];  Service: Cardiovascular;  Laterality: N/A;  . CLIPPING OF ATRIAL APPENDAGE N/A 11/14/2014   Procedure: CLIPPING OF ATRIAL APPENDAGE;  Surgeon: [name], MD;  Location: [facility];  Service: Open Heart Surgery;  Laterality:  N/A;  . CORONARY ARTERY BYPASS GRAFT N/A 11/14/2014   Procedure: CORONARY ARTERY BYPASS GRAFTING (CABG)TIMES 2 USING LEFT INTERNAL MAMMARY ARTERY AND RIGHT SAPHENOUS VEIN HARVESTED ENDOSCOPICALLY;  Surgeon: [name], MD;  Location: [facility];  Service: Open Heart Surgery;  Laterality: N/A;  . LEFT HEART CATHETERIZATION WITH CORONARY ANGIOGRAM N/A 11/04/2014   Procedure: LEFT HEART CATHETERIZATION WITH CORONARY ANGIOGRAM;  Surgeon: Troy Sine, MD;  Location: [facility] CATH LAB;  Service: Cardiovascular;  Laterality: N/A;  . TEE  WITHOUT CARDIOVERSION N/A 11/14/2014   Procedure: TRANSESOPHAGEAL ECHOCARDIOGRAM (TEE);  Surgeon: [name], MD;  Location: [place] [facility];  Service: Open Heart Surgery;  Laterality: N/A;  . TEMPORARY PACEMAKER INSERTION  11/04/2014   Procedure: TEMPORARY PACEMAKER INSERTION;  Surgeon: Troy Sine, MD;  Location: [facility] C[facility] LAB;  Service: Cardiovascular;;    OB History    No data available       Home Medications    Prior to Admission medications   Medication Sig Start Date End Date Taking? Authorizing Provider  acetaminophen (TYLENOL) 500 MG tablet Take 500 mg by mouth every 6 (six) hours as needed for moderate pain.    Yes [provider]  aspirin EC 81 MG EC tablet Take 1 tablet (81 mg total) by mouth daily. 11/23/14  Yes Lars Pinks M, PA-C  atorvastatin (LIPITOR) 10 MG tablet TAKE 1 TABLET EVERY DAY  AT  6PM 06/06/16  Yes Hilty, Nadean Corwin, MD  carvedilol (COREG) 12.5 MG tablet Take 1.5 tablets (18.75 mg total) by mouth 2 (two) times daily. Patient taking differently: Take 12.5 mg by mouth 2 (two) times daily.  07/19/17  Yes Strader, [place] M, PA-C  ranitidine (ZANTAC) 150 MG capsule Take 150 mg by mouth daily as needed for heartburn.    Yes [provider]  triamterene-hydrochlorothiazide (DYAZIDE) 37.5-25 MG capsule Take 1 capsule by mouth daily. 08/12/15  Yes [provider]  [name] 40 MG tablet Take 20 mg by mouth daily as needed (gout flare up).  01/08/16  Yes [provider]  verapamil (CALAN-SR) 120 MG CR tablet Take 1 tablet (120 mg total) by mouth at bedtime. 07/19/17  Yes Strader, Fransisco Hertz, PA-C    Family History Family History  Problem Relation Age of Onset  . Cancer Mother   . Heart disease Father     Social History Social History   Tobacco Use  . Smoking status: Current Some Day Smoker    Packs/day: 1.00    Years: 32.00    Pack years: 32.00    Types: Cigarettes  . Smokeless tobacco: Never Used  . Tobacco comment:  occasional puff here and there - 02/09/15  Substance Use Topics  . Alcohol use: No    Alcohol/week: 0.0 oz  . Drug use: No     Allergies   Bee venom; Codeine; Shrimp [shellfish allergy]; and Tomato   Review of Systems Review of Systems  Constitutional: Negative for fever.  HENT: Negative for congestion.   Eyes: Negative for visual disturbance.  Respiratory: Positive for shortness of breath.   Cardiovascular: Positive for palpitations. Negative for chest pain.  Gastrointestinal: Negative for abdominal pain.  Genitourinary: Negative for dysuria.  Musculoskeletal: Negative for back pain.  Skin: Negative for rash.  Neurological: Positive for weakness. Negative for headaches.  Hematological: Does not bruise/bleed easily.  Psychiatric/Behavioral: Negative for confusion.     Physical Exam Updated Vital Signs BP 95/75   Pulse 62   Resp (!) 25   Ht  1.702 m (5\' 7" )   Wt 95.3 kg (210 lb)   SpO2 97%   BMI 32.89 kg/m   Physical Exam  Constitutional: She is oriented to person, place, and time. She appears well-developed and well-nourished. No distress.  HENT:  Head: Normocephalic and atraumatic.  Mouth/Throat: Oropharynx is clear and moist.  Eyes: Conjunctivae and EOM are normal. Pupils are equal, round, and reactive to light.  Neck: Normal range of motion. Neck supple.  Cardiovascular: Normal rate, regular rhythm and normal heart sounds.  Pulmonary/Chest: Effort normal and breath sounds normal. She exhibits no tenderness.  Abdominal: Soft. Bowel sounds are normal. There is no tenderness.  Musculoskeletal: Normal range of motion.  Neurological: She is alert and oriented to person, place, and time. No cranial nerve deficit or sensory deficit. She exhibits normal muscle tone. Coordination normal.  Skin: Skin is warm.  Nursing note and vitals reviewed.    ED Treatments / Results  Labs (all labs ordered are listed, but only abnormal results are displayed) Labs Reviewed   BASIC METABOLIC PANEL - Abnormal; Notable for the following components:      Result Value   Chloride 112 (*)    CO2 19 (*)    Glucose, Bld 149 (*)    BUN 27 (*)    Creatinine, Ser 1.50 (*)    Calcium 7.6 (*)    GFR calc non Af Amer 34 (*)    GFR calc Af Amer 39 (*)    All other components within normal limits  CBC - Abnormal; Notable for the following components:   WBC 11.4 (*)    RDW 16.9 (*)    Platelets 122 (*)    All other components within normal limits  BRAIN NATRIURETIC PEPTIDE - Abnormal; Notable for the following components:   B Natriuretic Peptide 619.1 (*)    All other components within normal limits    EKG  EKG Interpretation  Date/Time:  Saturday July 22 2017 19:05:09 EST Ventricular Rate:  120 PR Interval:    QRS Duration: 106 QT Interval:  364 QTC Calculation: 515 R Axis:   6 Text Interpretation:  Atrial fibrillation Abnormal T, consider ischemia, lateral leads Prolonged QT interval Confirmed by Fredia Sorrow [phone]) on 07/22/2017 7:17:19 PM Also confirmed by Fredia Sorrow [phone])  on 07/22/2017 8:26:58 PM       Radiology Dg Chest 2 View  Result Date: 07/22/2017 CLINICAL DATA:  Chest pain. EXAM: CHEST  2 VIEW COMPARISON:  June 23, 2017 FINDINGS: Cardiomegaly. Atelectasis or scar in the lingula, unchanged. No suspicious nodules, masses, or acute infiltrates. Pulmonary venous congestion identified. No overt edema. IMPRESSION: Cardiomegaly and pulmonary venous congestion. Electronically Signed   By: Dorise Bullion III M.D   On: 07/22/2017 19:39   Ct Chest Wo Contrast  Result Date: 07/22/2017 CLINICAL DATA:  Chest pain.  Shortness of breath. EXAM: CT CHEST WITHOUT CONTRAST TECHNIQUE: Multidetector CT imaging of the chest was performed following the standard protocol without IV contrast. COMPARISON:  Chest x-ray July 22, 2017 FINDINGS: Cardiovascular: Coronary artery calcifications are identified. Cardiomegaly is noted. The thoracic aorta  demonstrates atherosclerotic change but no aneurysm. The central pulmonary arteries are normal in caliber. Mediastinum/Nodes: Shotty nodes in the mediastinum are identified. A precarinal node on axial image 62 measures 14 mm. No effusions. The esophagus and thyroid are unremarkable. Lungs/Pleura: Central airways are normal. No pneumothorax. Atelectasis in the lingula. Mild interstitial prominence and interlobular septal thickening is most consistent with mild edema/ pulmonary venous congestion. No suspicious  nodule or mass. No focal infiltrate. Upper Abdomen: No acute abnormality. Musculoskeletal: No chest wall mass or suspicious bone lesions identified. IMPRESSION: 1. Cardiomegaly and mild edema/pulmonary venous congestion. 2. Atherosclerosis in the nonaneurysmal aorta. Coronary artery calcifications. 3. Shotty and mildly enlarged mediastinal nodes are likely reactive. Aortic Atherosclerosis (ICD10-I70.0). Electronically Signed   By: Dorise Bullion III M.D   On: 07/22/2017 21:28    Procedures Procedures (including critical care time)  Medications Ordered in ED Medications - No data to display   Initial Impression / Assessment and Plan / ED Course  I have reviewed the triage vital signs and the nursing notes.  Pertinent labs & imaging results that were available during my care of the patient were reviewed by me and considered in my medical decision making (see chart for details).     Patient with extensive workup here in long-term monitoring.  Patient's heart rate eventually got down to the low 100s.  Oxygen saturations on room air remained mid 90s.  Patient did have CT chest to rule out any significant pulmonary edema.  There is some vascular congestion but nothing severe on CT.  Patient's BNP is a little bit higher than usual.  I think the main problem with her symptoms was is to both of the Coreg and the [place] simultaneously.  Discussed in length with her and family about taking the  medications properly and giving cardiology a call for follow-up early part of the week.  Patient never had any chest pain.  Patient had not been hypotensive here.  Final Clinical Impressions(s) / ED Diagnoses   Final diagnoses:  Persistent atrial fibrillation [facility])    ED Discharge Orders    None       Fredia Sorrow, MD 07/23/17 1811

## 2017-07-22 NOTE — ED Notes (Signed)
Pt. Given food.

## 2017-07-22 NOTE — ED Notes (Signed)
Pt. Return from CT via stretcher. 

## 2017-07-22 NOTE — ED Notes (Signed)
Pt. To CT via stretcher. 

## 2017-07-22 NOTE — ED Notes (Signed)
Evangeline Gula- Daughter  [phone]

## 2017-07-22 NOTE — ED Triage Notes (Addendum)
Patient arrived to ED via [facility] from home. EMS reports:  Patient called reporting chest pain. When EMS arrived, patient presented with weakness following self administration of new medication (for A fib mgmt), Verapamil. Also, c/o shortness of breath and nausea.  Patient recently diagnosed with A fib. On medication x 3 weeks. Pulse ranging 90-150. Hx CABG. EMS administered NS 1000, Zofran 4 mg and ASA 324 mg. BP low at beginning, but 121/81 after administration of partial bag of NS.

## 2017-07-22 NOTE — ED Notes (Signed)
Pt. Ambulated to bathroom with steady gait.  

## 2017-07-22 NOTE — Discharge Instructions (Signed)
°  Follow-up with cardiology number provided.  Return for any new or worse symptoms.  According to our notes you are to take Coreg 2 times daily it is supposed to be 1-1/2 tablets.  And you are supposed to be taking verapamil 120 mg once at bedtime.  Those are your 2 new medicines that they prescribed on December 5.  Do not take both of these medicines together at the same time like you did tonight.

## 2017-07-26 ENCOUNTER — Encounter (HOSPITAL_COMMUNITY): Payer: Self-pay

## 2017-07-26 ENCOUNTER — Other Ambulatory Visit: Payer: Self-pay

## 2017-07-26 ENCOUNTER — Emergency Department (HOSPITAL_COMMUNITY): Payer: PPO

## 2017-07-26 ENCOUNTER — Inpatient Hospital Stay (HOSPITAL_COMMUNITY)
Admission: EM | Admit: 2017-07-26 | Discharge: 2017-07-29 | DRG: 308 | Disposition: A | Discharge status: Home / Self Care | Payer: PPO | Attending: Internal Medicine | Admitting: Internal Medicine

## 2017-07-26 ENCOUNTER — Other Ambulatory Visit: Payer: Self-pay | Admitting: *Deleted

## 2017-07-26 ENCOUNTER — Telehealth: Payer: Self-pay | Admitting: Physician Assistant

## 2017-07-26 DIAGNOSIS — Z6832 Body mass index (BMI) 32.0-32.9, adult: Secondary | ICD-10-CM | POA: Diagnosis not present

## 2017-07-26 DIAGNOSIS — E663 Overweight: Secondary | ICD-10-CM | POA: Diagnosis present

## 2017-07-26 DIAGNOSIS — Z809 Family history of malignant neoplasm, unspecified: Secondary | ICD-10-CM | POA: Diagnosis not present

## 2017-07-26 DIAGNOSIS — Z951 Presence of aortocoronary bypass graft: Secondary | ICD-10-CM

## 2017-07-26 DIAGNOSIS — Z91013 Allergy to seafood: Secondary | ICD-10-CM

## 2017-07-26 DIAGNOSIS — I255 Ischemic cardiomyopathy: Secondary | ICD-10-CM | POA: Diagnosis not present

## 2017-07-26 DIAGNOSIS — M1990 Unspecified osteoarthritis, unspecified site: Secondary | ICD-10-CM | POA: Diagnosis present

## 2017-07-26 DIAGNOSIS — I252 Old myocardial infarction: Secondary | ICD-10-CM

## 2017-07-26 DIAGNOSIS — E785 Hyperlipidemia, unspecified: Secondary | ICD-10-CM | POA: Diagnosis present

## 2017-07-26 DIAGNOSIS — I10 Essential (primary) hypertension: Secondary | ICD-10-CM | POA: Diagnosis not present

## 2017-07-26 DIAGNOSIS — Z91018 Allergy to other foods: Secondary | ICD-10-CM

## 2017-07-26 DIAGNOSIS — I5020 Unspecified systolic (congestive) heart failure: Secondary | ICD-10-CM | POA: Diagnosis not present

## 2017-07-26 DIAGNOSIS — I481 Persistent atrial fibrillation: Secondary | ICD-10-CM | POA: Diagnosis present

## 2017-07-26 DIAGNOSIS — Z8679 Personal history of other diseases of the circulatory system: Secondary | ICD-10-CM

## 2017-07-26 DIAGNOSIS — Z91030 Bee allergy status: Secondary | ICD-10-CM | POA: Diagnosis not present

## 2017-07-26 DIAGNOSIS — I2510 Atherosclerotic heart disease of native coronary artery without angina pectoris: Secondary | ICD-10-CM | POA: Diagnosis present

## 2017-07-26 DIAGNOSIS — I25119 Atherosclerotic heart disease of native coronary artery with unspecified angina pectoris: Secondary | ICD-10-CM

## 2017-07-26 DIAGNOSIS — Z7982 Long term (current) use of aspirin: Secondary | ICD-10-CM

## 2017-07-26 DIAGNOSIS — Z8249 Family history of ischemic heart disease and other diseases of the circulatory system: Secondary | ICD-10-CM | POA: Diagnosis not present

## 2017-07-26 DIAGNOSIS — Z8674 Personal history of sudden cardiac arrest: Secondary | ICD-10-CM | POA: Diagnosis not present

## 2017-07-26 DIAGNOSIS — I5043 Acute on chronic combined systolic (congestive) and diastolic (congestive) heart failure: Secondary | ICD-10-CM

## 2017-07-26 DIAGNOSIS — F17210 Nicotine dependence, cigarettes, uncomplicated: Secondary | ICD-10-CM | POA: Diagnosis present

## 2017-07-26 DIAGNOSIS — I110 Hypertensive heart disease with heart failure: Secondary | ICD-10-CM | POA: Diagnosis not present

## 2017-07-26 DIAGNOSIS — I509 Heart failure, unspecified: Secondary | ICD-10-CM | POA: Diagnosis not present

## 2017-07-26 DIAGNOSIS — I340 Nonrheumatic mitral (valve) insufficiency: Secondary | ICD-10-CM | POA: Diagnosis present

## 2017-07-26 DIAGNOSIS — Z885 Allergy status to narcotic agent status: Secondary | ICD-10-CM | POA: Diagnosis not present

## 2017-07-26 DIAGNOSIS — I130 Hypertensive heart and chronic kidney disease with heart failure and stage 1 through stage 4 chronic kidney disease, or unspecified chronic kidney disease: Secondary | ICD-10-CM | POA: Diagnosis not present

## 2017-07-26 DIAGNOSIS — N183 Chronic kidney disease, stage 3 (moderate): Secondary | ICD-10-CM | POA: Diagnosis not present

## 2017-07-26 DIAGNOSIS — R0602 Shortness of breath: Secondary | ICD-10-CM | POA: Diagnosis not present

## 2017-07-26 DIAGNOSIS — I480 Paroxysmal atrial fibrillation: Secondary | ICD-10-CM | POA: Diagnosis not present

## 2017-07-26 DIAGNOSIS — I4891 Unspecified atrial fibrillation: Secondary | ICD-10-CM | POA: Diagnosis not present

## 2017-07-26 HISTORY — DX: Chronic combined systolic (congestive) and diastolic (congestive) heart failure: I50.42

## 2017-07-26 LAB — COMPREHENSIVE METABOLIC PANEL
ALT: 53 U/L (ref 14–54)
AST: 39 U/L (ref 15–41)
Albumin: 3 g/dL — ABNORMAL LOW (ref 3.5–5.0)
Alkaline Phosphatase: 244 U/L — ABNORMAL HIGH (ref 38–126)
Anion gap: 10 (ref 5–15)
BUN: 28 mg/dL — ABNORMAL HIGH (ref 6–20)
CO2: 20 mmol/L — ABNORMAL LOW (ref 22–32)
Calcium: 8.9 mg/dL (ref 8.9–10.3)
Chloride: 110 mmol/L (ref 101–111)
Creatinine, Ser: 1.43 mg/dL — ABNORMAL HIGH (ref 0.44–1.00)
GFR calc Af Amer: 41 mL/min — ABNORMAL LOW (ref >60)
GFR calc non Af Amer: 36 mL/min — ABNORMAL LOW (ref >60)
Glucose, Bld: 126 mg/dL — ABNORMAL HIGH (ref 65–99)
Potassium: 3.2 mmol/L — ABNORMAL LOW (ref 3.5–5.1)
Sodium: 140 mmol/L (ref 135–145)
Total Bilirubin: 1.3 mg/dL — ABNORMAL HIGH (ref 0.3–1.2)
Total Protein: 6.5 g/dL (ref 6.5–8.1)

## 2017-07-26 LAB — CBC WITH DIFFERENTIAL/PLATELET
Basophils Absolute: 0 10*3/uL (ref 0.0–0.1)
Basophils Relative: 0 %
Eosinophils Absolute: 0.1 10*3/uL (ref 0.0–0.7)
Eosinophils Relative: 1 %
HCT: 43.6 % (ref 36.0–46.0)
Hemoglobin: 14.8 g/dL (ref 12.0–15.0)
Lymphocytes Relative: 28 %
Lymphs Abs: 2.7 10*3/uL (ref 0.7–4.0)
MCH: 29.8 pg (ref 26.0–34.0)
MCHC: 33.9 g/dL (ref 30.0–36.0)
MCV: 87.7 fL (ref 78.0–100.0)
Monocytes Absolute: 0.5 10*3/uL (ref 0.1–1.0)
Monocytes Relative: 5 %
Neutro Abs: 6.3 10*3/uL (ref 1.7–7.7)
Neutrophils Relative %: 66 %
Platelets: 136 10*3/uL — ABNORMAL LOW (ref 150–400)
RBC: 4.97 MIL/uL (ref 3.87–5.11)
RDW: 17 % — ABNORMAL HIGH (ref 11.5–15.5)
WBC: 9.6 10*3/uL (ref 4.0–10.5)

## 2017-07-26 LAB — PROTIME-INR
INR: 1.24
Prothrombin Time: 15.5 seconds — ABNORMAL HIGH (ref 11.4–15.2)

## 2017-07-26 LAB — I-STAT TROPONIN, ED: Troponin i, poc: 0.01 ng/mL (ref 0.00–0.08)

## 2017-07-26 LAB — APTT: aPTT: 34 seconds (ref 24–36)

## 2017-07-26 LAB — BRAIN NATRIURETIC PEPTIDE: B Natriuretic Peptide: 825.8 pg/mL — ABNORMAL HIGH (ref 0.0–100.0)

## 2017-07-26 MED ORDER — CARVEDILOL 12.5 MG PO TABS
18.7500 mg | ORAL_TABLET | Freq: Two times a day (BID) | ORAL | Status: DC
  Administered 2017-07-27 – 2017-07-29 (×5): 18.75 mg via ORAL
  Filled 2017-07-26 (×5): qty 2

## 2017-07-26 MED ORDER — ONDANSETRON HCL 4 MG/2ML IJ SOLN: 4.0000 mg | Freq: Four times a day (QID) | INTRAMUSCULAR | Status: DC | PRN

## 2017-07-26 MED ORDER — FUROSEMIDE 20 MG PO TABS: 40.0000 mg | ORAL_TABLET | Freq: Every day | ORAL | Status: DC

## 2017-07-26 MED ORDER — DILTIAZEM HCL 100 MG IV SOLR: 5.0000 mg/h | INTRAVENOUS | Status: DC

## 2017-07-26 MED ORDER — DILTIAZEM LOAD VIA INFUSION
10.0000 mg | Freq: Once | INTRAVENOUS | Status: AC
  Administered 2017-07-26: 10 mg via INTRAVENOUS
  Filled 2017-07-26: qty 10

## 2017-07-26 MED ORDER — ENOXAPARIN SODIUM 100 MG/ML ~~LOC~~ SOLN
95.0000 mg | Freq: Two times a day (BID) | SUBCUTANEOUS | Status: DC
  Administered 2017-07-26 – 2017-07-27 (×2): 95 mg via SUBCUTANEOUS
  Filled 2017-07-26 (×3): qty 1

## 2017-07-26 MED ORDER — ASPIRIN EC 81 MG PO TBEC
81.0000 mg | DELAYED_RELEASE_TABLET | Freq: Every day | ORAL | Status: DC
  Administered 2017-07-26 – 2017-07-27 (×2): 81 mg via ORAL
  Filled 2017-07-26 (×2): qty 1

## 2017-07-26 MED ORDER — FUROSEMIDE 20 MG PO TABS
40.0000 mg | ORAL_TABLET | Freq: Every day | ORAL | Status: DC
  Administered 2017-07-27 – 2017-07-29 (×3): 40 mg via ORAL
  Filled 2017-07-26 (×3): qty 2

## 2017-07-26 MED ORDER — DEXTROSE 5 % IV SOLN
5.0000 mg/h | INTRAVENOUS | Status: DC
  Administered 2017-07-26: 5 mg/h via INTRAVENOUS
  Administered 2017-07-26 – 2017-07-27 (×2): 15 mg/h via INTRAVENOUS
  Filled 2017-07-26 (×3): qty 100

## 2017-07-26 MED ORDER — ACETAMINOPHEN 500 MG PO TABS
1000.0000 mg | ORAL_TABLET | Freq: Three times a day (TID) | ORAL | Status: DC | PRN
  Administered 2017-07-26 – 2017-07-29 (×2): 1000 mg via ORAL
  Filled 2017-07-26 (×3): qty 2

## 2017-07-26 MED ORDER — ATORVASTATIN CALCIUM 10 MG PO TABS
10.0000 mg | ORAL_TABLET | Freq: Every day | ORAL | Status: DC
  Administered 2017-07-27 – 2017-07-28 (×2): 10 mg via ORAL
  Filled 2017-07-26 (×2): qty 1

## 2017-07-26 MED ORDER — FUROSEMIDE 10 MG/ML IJ SOLN
40.0000 mg | Freq: Once | INTRAMUSCULAR | Status: AC
  Administered 2017-07-26: 40 mg via INTRAVENOUS
  Filled 2017-07-26: qty 4

## 2017-07-26 NOTE — Patient Outreach (Signed)
[facility]) Care Management  07/26/2017  [name] [name] 02/15/1946 [id number]  Telephone Screen  Referral Date:  07/25/2017 Referral Source:  [facility] ED Census Reason for Referral:  6 or more ED visits in the past 6 months Insurance:  [employer]   Outreach Attempt:  Outreach attempt #1 to patient for telephone screening. No answer. RN Health Coach left HIPAA compliant voicemail message along with contact information.  Plan: RN Health Coach to make another telephone screening outreach attempt within the next 3 business days.  [place] [phone] [email or web address]

## 2017-07-26 NOTE — Progress Notes (Signed)
ANTICOAGULATION CONSULT NOTE - Initial Consult  Pharmacy Consult for enoxaparin Indication: atrial fibrillation  Allergies  Allergen Reactions  . Bee Venom Anaphylaxis  . Codeine Nausea And Vomiting  . Shrimp [Shellfish Allergy] Swelling  . Tomato     Pt states it gives her gout    Patient Measurements: Height: 5\' 7"  (170.2 cm) Weight: 210 lb (95.3 kg) IBW/kg (Calculated) : 61.6  Assessment: 71 yo F presents with Afib RVR and SOB. Pharmacy consulted to start enoxaparin. No anticoag PTA. Hgb wnl and plts low at 136.  Goal of Therapy:  Anti-Xa level 0.6-1 units/ml 4hrs after LMWH dose given Monitor platelets by anticoagulation protocol: Yes   Plan:  Start enoxaparin 95mg  [place] Q12h Monitor CBC, s/s of bleed  Elenor Quinones, PharmD, [facility] Clinical Pharmacist Pager [phone] 07/26/2017 7:56 PM

## 2017-07-26 NOTE — H&P (Addendum)
Cardiology Admission History and Physical:   Patient ID: [name] [name]; [id number]; 27-May-1946   Admission date: 07/26/2017  Primary Care Provider: [name], MD Primary Cardiologist: [name], MD  Chief Complaint: Shortness of breath and palpitations  Patient Profile:   [name] [name] is a 71 y.o. female with a history of CAD status post CABG in 2016 with LIMA to LAD and SVG to OM, paroxysmal atrial fibrillation with CHADSVASC score of 5 (not anticoagulated status post clipping of the left atrial appendage), hypertension, hyperlipidemia, and ischemic cardiomyopathy with improvement in LVEF from the range of 35-40% up to 50-55% as of May 2018.  History of Present Illness:   [name] [name] presents to the ER complaining of progressive shortness of breath with intermittent sense of palpitations.  She was recently seen in the office on December 5 by [name] PA-C with similar symptoms.  Patient has undergone medication adjustments as detailed in the recent office note including a down titration of Cardizem CD in the setting of rash (not entirely clear whether this was related specifically or not based on discussion with the patient today), up titration of carvedilol, and trial of verapamil in the setting of persistent rapid atrial fibrillation.  [name] [name] indicates that she did not take verapamil as she was concerned about potential side effects and cross-reactivity with her other medications, states that she has remained on her previous dose of carvedilol which was 12.5 mg twice daily.  In the ER she is noted to be in rapid atrial fibrillation, heart rates in the 130s-140s.  She was placed on intravenous diltiazem by ER staff.  Also noted to have evidence of mild fluid overload, reporting leg edema, and chest x-ray indicating mild vascular congestion in the setting of moderately elevated BNP.  Troponin I level is negative arguing against ACS.  ECG shows rapid atrial fibrillation with LVH  and probable repolarization abnormalities.  Based on chart review patient has had paroxysmal atrial fibrillation over the last few years, originally on amiodarone after CABG, and apparently with fairly frequent episodes over the last 6 months based on review of ECGs.  Patient indicates that she does have some good days with relatively stable dyspnea and no palpitations suggesting paroxysmal atrial fibrillation, but it looks like she has had persistent arrhythmia for the last few weeks at least based on discussion today.  His recent follow-up echocardiogram was in May of this year at which point LVEF was 50-55% with moderately dilated left atrium and mild mitral regurgitation.   Past Medical History:  Diagnosis Date  . Arthritis   . Asthma   . Cardiomyopathy, ischemic    LVEF 50-55% as of May 2018  . Chronic combined systolic and diastolic heart failure ([place])   . Hypertension   . Left main coronary artery disease 11/04/2014  . PAF (paroxysmal atrial fibrillation) ([place])   . S/P CABG x 2 with clipping of LA appendage 11/14/2014   LIMA to LAD, SVG to OM, EVH via right thigh    Past Surgical History:  Procedure Laterality Date  . CARDIOVERSION N/A 11/18/2014   Procedure: CARDIOVERSION;  Surgeon: [name], MD;  Location: [place];  Service: Cardiovascular;  Laterality: N/A;  . CLIPPING OF ATRIAL APPENDAGE N/A 11/14/2014   Procedure: CLIPPING OF ATRIAL APPENDAGE;  Surgeon: [name], MD;  Location: [place];  Service: Open Heart Surgery;  Laterality: N/A;  . CORONARY ARTERY BYPASS GRAFT N/A 11/14/2014   Procedure: CORONARY ARTERY BYPASS GRAFTING (  CABG)TIMES 2 USING LEFT INTERNAL MAMMARY ARTERY AND RIGHT SAPHENOUS VEIN HARVESTED ENDOSCOPICALLY;  Surgeon: [name], MD;  Location: [place];  Service: Open Heart Surgery;  Laterality: N/A;  . LEFT HEART CATHETERIZATION WITH CORONARY ANGIOGRAM N/A 11/04/2014   Procedure: LEFT HEART CATHETERIZATION WITH CORONARY ANGIOGRAM;  Surgeon: Troy Sine,  MD;  Location[facility] CATH LAB;  Service: Cardiovascular;  Laterality: N/A;  . TEE WITHOUT CARDIOVERSION N/A 11/14/2014   Procedure: TRANSESOPHAGEAL ECHOCARDIOGRAM (TEE);  Surgeon: [name], MD;  Location: [place];  Service: Open Heart Surgery;  Laterality: N/A;  . TEMPORARY PACEMAKER INSERTION  11/04/2014   Procedure: TEMPORARY PACEMAKER INSERTION;  Surgeon: Troy Sine, MD;  Location: [facility] CATH LAB;  Service: Cardiovascular;;     Medications Prior to Admission: Prior to Admission medications   Medication Sig Start Date End Date Taking? Authorizing Provider  acetaminophen (TYLENOL) 500 MG tablet Take 500 mg by mouth every 6 (six) hours as needed for moderate pain.    Yes [provider]  aspirin EC 81 MG EC tablet Take 1 tablet (81 mg total) by mouth daily. 11/23/14  Yes Lars Pinks M, PA-C  atorvastatin (LIPITOR) 10 MG tablet TAKE 1 TABLET EVERY DAY  AT  6PM Patient taking differently: TAKE 10 mg TABLET EVERY DAY 06/06/16  Yes Hilty, Nadean Corwin, MD  carvedilol (COREG) 12.5 MG tablet Take 1.5 tablets (18.75 mg total) by mouth 2 (two) times daily. Patient taking differently: Take 12.5 mg by mouth 2 (two) times daily.  07/19/17  Yes [name], [place] M, PA-C  ranitidine (ZANTAC) 150 MG capsule Take 150 mg by mouth daily as needed for heartburn.    Yes [provider]  triamterene-hydrochlorothiazide (DYAZIDE) 37.5-25 MG capsule Take 1 capsule by mouth daily. 08/12/15  Yes [provider]  [name] 40 MG tablet Take 20 mg by mouth daily as needed (gout flare up).  01/08/16  Yes [provider]  verapamil (CALAN-SR) 120 MG CR tablet Take 1 tablet (120 mg total) by mouth at bedtime. 07/19/17  Yes [name], Fransisco Hertz, PA-C     Allergies:    Allergies  Allergen Reactions  . Bee Venom Anaphylaxis  . Codeine Nausea And Vomiting  . Shrimp [Shellfish Allergy] Swelling  . Tomato     Pt states it gives her gout    Social History:   Social History    Socioeconomic History  . Marital status: Widowed    Spouse name: Not on file  . Number of children: Not on file  . Years of education: Not on file  . Highest education level: Not on file  Social Needs  . Financial resource strain: Not on file  . Food insecurity - worry: Not on file  . Food insecurity - inability: Not on file  . Transportation needs - medical: Not on file  . Transportation needs - non-medical: Not on file  Occupational History  . Not on file  Tobacco Use  . Smoking status: Current Some Day Smoker    Packs/day: 1.00    Years: 32.00    Pack years: 32.00    Types: Cigarettes  . Smokeless tobacco: Never Used  . Tobacco comment: occasional puff here and there - 02/09/15  Substance and Sexual Activity  . Alcohol use: No    Alcohol/week: 0.0 oz  . Drug use: No  . Sexual activity: Not on file  Other Topics Concern  . Not on file  Social History Narrative  . Not on file  Family History:   [name] family history includes Cancer in her mother; Heart disease in her father.    ROS:  Please see the history of present illness.  Chronic dyspnea on exertion with intermittent palpitations, intermittent leg swelling.  Occasional gout flares.  All other ROS reviewed and negative.     Physical Exam/Data:   Vitals:   07/26/17 1833 07/26/17 1838 07/26/17 1839 07/26/17 1845  BP:    (!) 112/96  Pulse:  (!) 58 75   Resp:  (!) 25 20 (!) 23  Temp:      TempSrc:      SpO2: 97% 99% 97% 99%  Weight:      Height:       No intake or output data in the 24 hours ending 07/26/17 1911 Filed Weights   07/26/17 1623  Weight: 210 lb (95.3 kg)   Body mass index is 32.89 kg/m.   Gen: Morbidly obese woman, mildly dyspneic at rest. HEENT: Conjunctiva and lids normal, oropharynx clear. Neck: Supple, no elevated JVP or carotid bruits, no thyromegaly. Lungs: Few crackles at the bases without wheezing. Cardiac: Rapid, irregularly irregular, no S3, soft systolic murmur, no  pericardial rub. Abdomen: Obese, nontender, bowel sounds present. Extremities: 1-2+ lower leg edema, distal pulses 2+. Skin: Warm and dry. Musculoskeletal: No kyphosis. Neuropsychiatric: Alert and oriented x3, affect grossly appropriate.   EKG:  I personally reviewed the tracing from 07/26/2017 which showed rapid atrial fibrillation with LVH and repolarization abnormalities.  Relevant CV Studies:  Echocardiogram 01/11/2017: Study Conclusions  - Left ventricle: Posterior lateral hypokinesis. The cavity size   was normal. Systolic function was normal. The estimated ejection   fraction was in the range of 50% to 55%. Left ventricular   diastolic function parameters were normal. - Aortic valve: There was mild regurgitation. - Mitral valve: There was mild regurgitation. - Left atrium: The atrium was moderately dilated. - Atrial septum: No defect or patent foramen ovale was identified.  Laboratory Data:  Chemistry Recent Labs  Lab 07/22/17 1924 07/26/17 1631  NA 138 140  K 4.0 3.2*  CL 112* 110  CO2 19* 20*  GLUCOSE 149* 126*  BUN 27* 28*  CREATININE 1.50* 1.43*  CALCIUM 7.6* 8.9  GFRNONAA 34* 36*  GFRAA 39* 41*  ANIONGAP 7 10    Recent Labs  Lab 07/26/17 1631  PROT 6.5  ALBUMIN 3.0*  AST 39  ALT 53  ALKPHOS 244*  BILITOT 1.3*   Hematology Recent Labs  Lab 07/22/17 1924 07/26/17 1631  WBC 11.4* 9.6  RBC 4.65 4.97  HGB 13.8 14.8  HCT 41.1 43.6  MCV 88.4 87.7  MCH 29.7 29.8  MCHC 33.6 33.9  RDW 16.9* 17.0*  PLT 122* 136*   Cardiac EnzymesNo results for input(s): TROPONINI in the last 168 hours.  Recent Labs  Lab 07/26/17 1647  TROPIPOC 0.01    BNP Recent Labs  Lab 07/22/17 2215 07/26/17 1631  BNP 619.1* 825.8*    Radiology/Studies:  Dg Chest Port 1 View  Result Date: 07/26/2017 CLINICAL DATA:  Shortness of breath for 2 weeks. EXAM: PORTABLE CHEST 1 VIEW COMPARISON:  Chest radiograph 08/01/2017. FINDINGS: Cardiomegaly. Mild vascular  congestion. No edema or consolidation. Prior median sternotomy. No osseous findings. Prior CABG, with atrial appendage clip. IMPRESSION: Cardiomegaly with mild vascular congestion, overall slightly improved from priors. No consolidation or edema. Electronically Signed   By: Staci Righter M.D.   On: 07/26/2017 16:48    Assessment and Plan:  1.  Persistent atrial fibrillation with RVR.  Patient has known paroxysmal atrial fibrillation, seemingly with increasing and more persistent episodes within the last few months.  She is symptomatic with increasing dyspnea on exertion and also has likely associated acute on chronic combined heart failure.  The history that she provides is somewhat vague, and it is difficult to sort out clear onset of her symptoms and apparent medication intolerances. CHADSVASC score is 5, she is not anticoagulated and has history of left atrial appendage clipping with CABG in 2016.  Recent attempted outpatient medication adjustments have not been effective, in fact she did not feel comfortable making the most recent change to verapamil along with Coreg.  2.  Probable acute on chronic combined heart failure.  Most recent LVEF 50-55% as of May 2018.  3.  CAD status post CABG in 2016 with LIMA to LAD and SVG to OM.  No obvious angina symptoms and initial troponin I is negative.  4.  Essential hypertension.  5.  History of cardiomyopathy with LVEF improved from the range of 35-40% up to 50-55%.  I reviewed the patient's records and we discussed her recent symptoms and concerns.  Recent suggested outpatient medication adjustments were reasonable, although patient has been uncomfortable with these and remains symptomatic with rapid atrial fibrillation.  Plan is to admit her to the hospital for further medication adjustments and consultation with EP to discuss additional treatment options.  She was on amiodarone postoperatively for rhythm control, and considering antiarrhythmic therapy  longer term would be reasonable given her recurrences.  She was previously on Eliquis for anticoagulation, although it looks like this was discontinued sometime in 2017 since she was maintaining sinus rhythm and had undergone left atrial appendage clipping.  Cardioversion would require resumption and continuation of anticoagulation, and would require further discussion.  She will be on Lovenox per pharmacy for now.  She should have a follow-up echocardiogram with heart failure symptoms, but would wait until her heart rate is better controlled.  In the short-term we will continue with IV Cardizem, increase Coreg to 18.75 mg twice daily which had been the recent recommendation.  Cycle cardiac markers, but doubt ACS at this time.   Signed, Rozann Lesches, MD  07/26/2017 7:11 PM

## 2017-07-26 NOTE — ED Notes (Signed)
Dr Maryan Rued in room

## 2017-07-26 NOTE — ED Notes (Signed)
Heart healthy tray ordered 

## 2017-07-26 NOTE — ED Triage Notes (Signed)
Per EMS, pt sent by [facility] for shob x 2 weeks worsening x 2 days an afib with rvr. Denies chest pain. BP normal and alert and oriented x 4. HR 140-170. Breath sounds abnormal.

## 2017-07-26 NOTE — ED Notes (Signed)
Pt assisted to bedside commode

## 2017-07-26 NOTE — Telephone Encounter (Signed)
Patient calling to adjust her medications. Unable to provide detailed information. No blood pressure machine at home. She will call after 10am today to speak with nurse with all medication informations.

## 2017-07-26 NOTE — ED Provider Notes (Signed)
[facility] EMERGENCY DEPARTMENT Provider Note   CSN: [id number] Arrival date & time: 07/26/17  1611     History   Chief Complaint Chief Complaint  Patient presents with  . Atrial Fibrillation  . Shortness of Breath    HPI [name] [name] is a 71 y.o. female.  Patient is a 71 year old female with an extensive cardiac history including coronary artery disease status post CABG, 1 year ago with cardiac arrest resulting in cooling and intermittent episodes of bradycardia and rapid atrial fibrillation who has returned to the emergency room multiple times for atrial fibrillation with RVR returning today from her doctor's office with A. fib with RVR.  Patient states she was seen 4 days ago for generalized weakness, dyspnea on exertion and palpitations.  At that time patient had normal blood work other than mildly elevated BNP and heart rate improved without significant intervention.  However patient states since being home her symptoms have only worsened.  She is now complaining of orthopnea and distal edema.  She is still taking her Coreg twice a day but is no longer taking diltiazem or verapamil.  Patient is concerned that diltiazem made her syncopized and she is scared to try to take it.  She had some aching in the left breast yesterday and earlier this morning but denies any pain currently.  She has had gradual worsening shortness of breath but denies any shortness of breath at rest.  She has had no cough, congestion, fever.  She currently is on aspirin but does not take any other anticoagulants.    Atrial Fibrillation  Associated symptoms include shortness of breath.  Shortness of Breath     Past Medical History:  Diagnosis Date  . Acute on chronic combined systolic and diastolic HF (heart failure) ([place]) 10/30/2014   EF 35-40% with RWMA   . Arthritis   . Asthma   . Atrial fibrillation with RVR ([place])   . Cardiomyopathy, ischemic   . Hypertension   . Left main  coronary artery disease 11/04/2014  . S/P CABG x 2 with clipping of LA appendage 11/14/2014   LIMA to LAD, SVG to OM, EVH via right thigh    Patient Active Problem List   Diagnosis Date Noted  . Paroxysmal atrial fibrillation ([place]) 02/08/2017  . Cardiomyopathy, ischemic 02/08/2017  . Dysphagia 02/08/2017  . CKD (chronic kidney disease), stage III ([place]) 01/30/2017  . Pain in shoulder 02/08/2016  . Breast pain, left 02/08/2016  . Bilateral arm numbness and tingling while sleeping 02/08/2016  . Painful lumpy left breast 09/23/2015  . [place] intertrigo 02/11/2015  . S/P CABG x 2 11/14/2014  . Accelerated hypertension   . Cardiac arrest ([place]) 11/04/2014  . Left main coronary artery disease 11/04/2014  . Coronary artery disease due to lipid rich plaque   . Acute respiratory failure with hypoxemia ([place])   . Essential hypertension   . Atrial fibrillation with RVR ([place])   . Hypokalemia 10/30/2014  . Chronic combined systolic and diastolic CHF (congestive heart failure) ([place]) 10/30/2014  . NSTEMI (non-ST elevated myocardial infarction) ([place])   . DOE (dyspnea on exertion)   . CAP (community acquired pneumonia) 10/29/2014    Past Surgical History:  Procedure Laterality Date  . CARDIOVERSION N/A 11/18/2014   Procedure: CARDIOVERSION;  Surgeon: [name], MD;  Location: [facility];  Service: Cardiovascular;  Laterality: N/A;  . CLIPPING OF ATRIAL APPENDAGE N/A 11/14/2014   Procedure: CLIPPING OF ATRIAL APPENDAGE;  Surgeon: [name],  MD;  Location: [facility] OR;  Service: Open Heart Surgery;  Laterality: N/A;  . CORONARY ARTERY BYPASS GRAFT N/A 11/14/2014   Procedure: CORONARY ARTERY BYPASS GRAFTING (CABG)TIMES 2 USING LEFT INTERNAL MAMMARY ARTERY AND RIGHT SAPHENOUS VEIN HARVESTED ENDOSCOPICALLY;  Surgeon: [name], MD;  Location: [facility];  Service: Open Heart Surgery;  Laterality: N/A;  . LEFT HEART CATHETERIZATION WITH CORONARY ANGIOGRAM N/A 11/04/2014   Procedure: LEFT HEART  CATHETERIZATION WITH CORONARY ANGIOGRAM;  Surgeon: Troy Sine, MD;  Location: [facility] CATH LAB;  Service: Cardiovascular;  Laterality: N/A;  . TEE WITHOUT CARDIOVERSION N/A 11/14/2014   Procedure: TRANSESOPHAGEAL ECHOCARDIOGRAM (TEE);  Surgeon: [name], MD;  Location: [facility];  Service: Open Heart Surgery;  Laterality: N/A;  . TEMPORARY PACEMAKER INSERTION  11/04/2014   Procedure: TEMPORARY PACEMAKER INSERTION;  Surgeon: Troy Sine, MD;  Location: [facility] CATH LAB;  Service: Cardiovascular;;    OB History    No data available       Home Medications    Prior to Admission medications   Medication Sig Start Date End Date Taking? Authorizing Provider  acetaminophen (TYLENOL) 500 MG tablet Take 500 mg by mouth every 6 (six) hours as needed for moderate pain.     [provider]  aspirin EC 81 MG EC tablet Take 1 tablet (81 mg total) by mouth daily. 11/23/14   Lars Pinks M, PA-C  atorvastatin (LIPITOR) 10 MG tablet TAKE 1 TABLET EVERY DAY  AT  [facility] 06/06/16   Hilty, Nadean Corwin, MD  carvedilol (COREG) 12.5 MG tablet Take 1.5 tablets (18.75 mg total) by mouth 2 (two) times daily. Patient taking differently: Take 12.5 mg by mouth 2 (two) times daily.  07/19/17   Strader, Fransisco Hertz, PA-C  ranitidine (ZANTAC) 150 MG capsule Take 150 mg by mouth daily as needed for heartburn.     [provider]  triamterene-hydrochlorothiazide (DYAZIDE) 37.5-25 MG capsule Take 1 capsule by mouth daily. 08/12/15   [provider]  [name] 40 MG tablet Take 20 mg by mouth daily as needed (gout flare up).  01/08/16   [provider]  verapamil (CALAN-SR) 120 MG CR tablet Take 1 tablet (120 mg total) by mouth at bedtime. 07/19/17   Erma Heritage, PA-C    Family History Family History  Problem Relation Age of Onset  . Cancer Mother   . Heart disease Father     Social History Social History   Tobacco Use  . Smoking status: Current Some Day Smoker    Packs/day: 1.00     Years: 32.00    Pack years: 32.00    Types: Cigarettes  . Smokeless tobacco: Never Used  . Tobacco comment: occasional puff here and there - 02/09/15  Substance Use Topics  . Alcohol use: No    Alcohol/week: 0.0 oz  . Drug use: No     Allergies   Bee venom; Codeine; Shrimp [shellfish allergy]; and Tomato   Review of Systems Review of Systems  Respiratory: Positive for shortness of breath.   All other systems reviewed and are negative.    Physical Exam Updated Vital Signs BP (!) 133/118 (BP Location: Left Arm)   Pulse (!) 150   Temp 98.2 F (36.8 C) (Oral)   Resp 20   Ht 5\' 7"  (1.702 m)   Wt 95.3 kg (210 lb)   SpO2 100%   BMI 32.89 kg/m   Physical Exam  Constitutional: She is oriented to person, place, and time. She  appears well-developed and well-nourished. No distress.  HENT:  Head: Normocephalic and atraumatic.  Mouth/Throat: Oropharynx is clear and moist.  Eyes: Conjunctivae and EOM are normal. Pupils are equal, round, and reactive to light.  Neck: Normal range of motion. Neck supple. JVD present.  Cardiovascular: Intact distal pulses. An irregularly irregular rhythm present. Tachycardia present.  No murmur heard. Pulmonary/Chest: Effort normal. Tachypnea noted. No respiratory distress. She has decreased breath sounds in the right lower field and the left lower field. She has no wheezes. She has rales.  Abdominal: Soft. She exhibits no distension. There is no tenderness. There is no rebound and no guarding.  Musculoskeletal: Normal range of motion. She exhibits edema. She exhibits no tenderness.  1+ pitting edema in bilateral lower extremities  Neurological: She is alert and oriented to person, place, and time.  Skin: Skin is warm and dry. No rash noted. No erythema.  Psychiatric: She has a normal mood and affect. Her behavior is normal.  Nursing note and vitals reviewed.    ED Treatments / Results  Labs (all labs ordered are listed, but only abnormal  results are displayed) Labs Reviewed  CBC WITH DIFFERENTIAL/PLATELET - Abnormal; Notable for the following components:      Result Value   RDW 17.0 (*)    Platelets 136 (*)    All other components within normal limits  COMPREHENSIVE METABOLIC PANEL - Abnormal; Notable for the following components:   Potassium 3.2 (*)    CO2 20 (*)    Glucose, Bld 126 (*)    BUN 28 (*)    Creatinine, Ser 1.43 (*)    Albumin 3.0 (*)    Alkaline Phosphatase 244 (*)    Total Bilirubin 1.3 (*)    GFR calc non Af Amer 36 (*)    GFR calc Af Amer 41 (*)    All other components within normal limits  BRAIN NATRIURETIC PEPTIDE - Abnormal; Notable for the following components:   B Natriuretic Peptide 825.8 (*)    All other components within normal limits  I-STAT TROPONIN, ED    EKG  EKG Interpretation  Date/Time:  Wednesday July 26 2017 16:18:47 EST Ventricular Rate:  139 PR Interval:    QRS Duration: 106 QT Interval:  284 QTC Calculation: 432 R Axis:   39 Text Interpretation:  Pacemaker spikes or artifacts Atrial fibrillation LVH with secondary repolarization abnormality Baseline wander in lead(s) I III aVL No significant change since last tracing Confirmed by Blanchie Dessert ([id number]) on 07/26/2017 4:36:03 PM       Radiology Dg Chest Port 1 View  Result Date: 07/26/2017 CLINICAL DATA:  Shortness of breath for 2 weeks. EXAM: PORTABLE CHEST 1 VIEW COMPARISON:  Chest radiograph 08/01/2017. FINDINGS: Cardiomegaly. Mild vascular congestion. No edema or consolidation. Prior median sternotomy. No osseous findings. Prior CABG, with atrial appendage clip. IMPRESSION: Cardiomegaly with mild vascular congestion, overall slightly improved from priors. No consolidation or edema. Electronically Signed   By: Staci Righter M.D.   On: 07/26/2017 16:48    Procedures Procedures (including critical care time)  Medications Ordered in ED Medications  diltiazem (CARDIZEM) 1 mg/mL load via infusion 10 mg (not  administered)    And  diltiazem (CARDIZEM) 100 mg in dextrose 5 % 100 mL (1 mg/mL) infusion (not administered)     Initial Impression / Assessment and Plan / ED Course  I have reviewed the triage vital signs and the nursing notes.  Pertinent labs & imaging results that were available during my  care of the patient were reviewed by me and considered in my medical decision making (see chart for details).     Patient is an elderly female with extensive cardiac history returning again today with atrial fibrillation with RVR.  Patient is also complaining of symptoms concerning for CHF exacerbation.  She does have evidence of fluid overload on exam.  She also has heart rates between 120-150.  Blood pressure is currently stable.  Patient has taken carvedilol today but is not taking any other medications at this time.  Will start patient on adult load and infusion.  Patient is not currently having chest pain. CBC, CMP, BNP, troponin, chest x-ray pending.  6:15 PM Trop neg but BNP continues to rise.  Pt does have cardiomegaly and vascular congestion on x-ray but no worse.  Labs are otherwise relatively normal.  Patient has been increased on Cardizem till 10 heart rates are still between 115-130.  Blood pressure remained stable.  Patient will also be given a dose of Lasix.  Will discuss with cardiology.  CRITICAL CARE Performed by: Louvina Cleary Total critical care time: 30 minutes Critical care time was exclusive of separately billable procedures and treating other patients. Critical care was necessary to treat or prevent imminent or life-threatening deterioration. Critical care was time spent personally by me on the following activities: development of treatment plan with patient and/or surrogate as well as nursing, discussions with consultants, evaluation of patient's response to treatment, examination of patient, obtaining history from patient or surrogate, ordering and performing treatments and  interventions, ordering and review of laboratory studies, ordering and review of radiographic studies, pulse oximetry and re-evaluation of patient's condition.   Final Clinical Impressions(s) / ED Diagnoses   Final diagnoses:  Atrial fibrillation with RVR ([place])  Acute congestive heart failure, unspecified heart failure type [facility])    ED Discharge Orders    None       Blanchie Dessert, MD 07/26/17 [facility]

## 2017-07-26 NOTE — ED Notes (Signed)
Portable in room.  

## 2017-07-27 ENCOUNTER — Other Ambulatory Visit: Payer: Self-pay | Admitting: *Deleted

## 2017-07-27 ENCOUNTER — Ambulatory Visit: Payer: PPO | Admitting: Internal Medicine

## 2017-07-27 ENCOUNTER — Other Ambulatory Visit: Payer: Self-pay

## 2017-07-27 DIAGNOSIS — I509 Heart failure, unspecified: Secondary | ICD-10-CM

## 2017-07-27 LAB — MAGNESIUM: Magnesium: 1.7 mg/dL (ref 1.7–2.4)

## 2017-07-27 LAB — BASIC METABOLIC PANEL
Anion gap: 11 (ref 5–15)
BUN: 27 mg/dL — ABNORMAL HIGH (ref 6–20)
CO2: 22 mmol/L (ref 22–32)
Calcium: 9 mg/dL (ref 8.9–10.3)
Chloride: 107 mmol/L (ref 101–111)
Creatinine, Ser: 1.32 mg/dL — ABNORMAL HIGH (ref 0.44–1.00)
GFR calc Af Amer: 45 mL/min — ABNORMAL LOW (ref >60)
GFR calc non Af Amer: 39 mL/min — ABNORMAL LOW (ref >60)
Glucose, Bld: 135 mg/dL — ABNORMAL HIGH (ref 65–99)
Potassium: 3 mmol/L — ABNORMAL LOW (ref 3.5–5.1)
Sodium: 140 mmol/L (ref 135–145)

## 2017-07-27 LAB — MRSA PCR SCREENING: MRSA by PCR: NEGATIVE

## 2017-07-27 LAB — CBC
HCT: 43.9 % (ref 36.0–46.0)
Hemoglobin: 14.9 g/dL (ref 12.0–15.0)
MCH: 30.5 pg (ref 26.0–34.0)
MCHC: 33.9 g/dL (ref 30.0–36.0)
MCV: 89.8 fL (ref 78.0–100.0)
Platelets: 120 10*3/uL — ABNORMAL LOW (ref 150–400)
RBC: 4.89 MIL/uL (ref 3.87–5.11)
RDW: 17.2 % — ABNORMAL HIGH (ref 11.5–15.5)
WBC: 9 10*3/uL (ref 4.0–10.5)

## 2017-07-27 MED ORDER — FLEET ENEMA 7-19 GM/118ML RE ENEM
1.0000 | ENEMA | Freq: Every day | RECTAL | Status: DC | PRN
  Administered 2017-07-27: 1 via RECTAL
  Filled 2017-07-27: qty 1

## 2017-07-27 MED ORDER — DILTIAZEM HCL ER COATED BEADS 180 MG PO CP24
180.0000 mg | ORAL_CAPSULE | Freq: Every day | ORAL | Status: DC
  Administered 2017-07-27: 180 mg via ORAL
  Filled 2017-07-27: qty 1

## 2017-07-27 MED ORDER — RIVAROXABAN 20 MG PO TABS
20.0000 mg | ORAL_TABLET | Freq: Every day | ORAL | Status: DC
  Administered 2017-07-27 – 2017-07-28 (×2): 20 mg via ORAL
  Filled 2017-07-27 (×2): qty 1

## 2017-07-27 MED ORDER — OXYMETAZOLINE HCL 0.05 % NA SOLN
1.0000 | Freq: Two times a day (BID) | NASAL | Status: DC
  Administered 2017-07-28 (×2): 1 via NASAL
  Filled 2017-07-27 (×2): qty 15

## 2017-07-27 MED ORDER — ALUM & MAG HYDROXIDE-SIMETH 200-200-20 MG/5ML PO SUSP
15.0000 mL | ORAL | Status: DC | PRN
  Filled 2017-07-27: qty 30

## 2017-07-27 NOTE — Progress Notes (Signed)
Progress Note  Patient Name: [name] [name] Date of Encounter: 07/27/2017  Primary Cardiologist: No primary care provider on file.   Subjective   Seems asymptomatic no palpitations this am   Inpatient Medications    Scheduled Meds: . aspirin EC  81 mg Oral Daily  . atorvastatin  10 mg Oral q1800  . carvedilol  18.75 mg Oral BID WC  . enoxaparin (LOVENOX) injection  95 mg Subcutaneous Q12H  . furosemide  40 mg Oral Daily   Continuous Infusions: . diltiazem (CARDIZEM) infusion 15 mg/hr (07/27/17 0432)   PRN Meds: acetaminophen, ondansetron (ZOFRAN) IV   Vital Signs    Vitals:   07/26/17 2230 07/26/17 2232 07/26/17 2303 07/27/17 0432  BP:  (!) 132/107 116/88 (!) 131/92  Pulse: (!) 106 (!) 51    Resp:  (!) 23 (!) 22 20  Temp:   98 F (36.7 C) 97.6 F (36.4 C)  TempSrc:   Oral   SpO2: 97% 99% 99% 96%  Weight:   216 lb 8 oz (98.2 kg) 216 lb 7.9 oz (98.2 kg)  Height:   5\' 7"  (1.702 m)     Intake/Output Summary (Last 24 hours) at 07/27/2017 0757 Last data filed at 07/27/2017 0452 Gross per 24 hour  Intake 415 ml  Output 1615 ml  Net -1200 ml   Filed Weights   07/26/17 1623 07/26/17 2303 07/27/17 0432  Weight: 210 lb (95.3 kg) 216 lb 8 oz (98.2 kg) 216 lb 7.9 oz (98.2 kg)    Telemetry    afib rate 80-95- Personally Reviewed  ECG    Afib nonspecific ST changes corrected QT 513   - Personally Reviewed  Physical Exam  Overweight black female  GEN: No acute distress.   Neck: No JVD Cardiac: RRR, no murmurs, rubs, or gallops.  Respiratory: Clear to auscultation bilaterally. GI: Soft, nontender, non-distended  MS: No edema; No deformity. Neuro:  Nonfocal  Psych: Normal affect   Labs    Chemistry Recent Labs  Lab 07/22/17 1924 07/26/17 1631  NA 138 140  K 4.0 3.2*  CL 112* 110  CO2 19* 20*  GLUCOSE 149* 126*  BUN 27* 28*  CREATININE 1.50* 1.43*  CALCIUM 7.6* 8.9  PROT  --  6.5  ALBUMIN  --  3.0*  AST  --  39  ALT  --  53  ALKPHOS  --   244*  BILITOT  --  1.3*  GFRNONAA 34* 36*  GFRAA 39* 41*  ANIONGAP 7 10     Hematology Recent Labs  Lab 07/22/17 1924 07/26/17 1631 07/27/17 0529  WBC 11.4* 9.6 9.0  RBC 4.65 4.97 4.89  HGB 13.8 14.8 14.9  HCT 41.1 43.6 43.9  MCV 88.4 87.7 89.8  MCH 29.7 29.8 30.5  MCHC 33.6 33.9 33.9  RDW 16.9* 17.0* 17.2*  PLT 122* 136* 120*    Cardiac EnzymesNo results for input(s): TROPONINI in the last 168 hours.  Recent Labs  Lab 07/26/17 1647  TROPIPOC 0.01     BNP Recent Labs  Lab 07/22/17 2215 07/26/17 1631  BNP 619.1* 825.8*     DDimer No results for input(s): DDIMER in the last 168 hours.   Radiology    Dg Chest Port 1 View  Result Date: 07/26/2017 CLINICAL DATA:  Shortness of breath for 2 weeks. EXAM: PORTABLE CHEST 1 VIEW COMPARISON:  Chest radiograph 08/01/2017. FINDINGS: Cardiomegaly. Mild vascular congestion. No edema or consolidation. Prior median sternotomy. No osseous findings. Prior CABG, with atrial  appendage clip. IMPRESSION: Cardiomegaly with mild vascular congestion, overall slightly improved from priors. No consolidation or edema. Electronically Signed   By: Staci Righter M.D.   On: 07/26/2017 16:48    Cardiac Studies   01/11/17 EF 50-55% mild MR LA 48 mm   Patient Profile   [name] [name] is a 71 y.o. female with a history of CAD status post CABG in 2016 with LIMA to LAD and SVG to OM, paroxysmal atrial fibrillation with CHADSVASC score of 5 (not anticoagulated status post clipping of the left atrial appendage), hypertension, hyperlipidemia, and ischemic cardiomyopathy with improvement in LVEF from the range of 35-40% up to 50-55% as of May 2018.   Assessment & Plan    AFib: seems more persistent to me All her ECG;s from May on show afib. Should be on anticoagulation despite clip given frequency of afib. Have asked EP to see. She seems to have Lots of reasons to not change or take certain meds and would appear that ablation would be A better option  for her than med changes. If not ? Tikosyn. Would also start eliquis bid Will Continue higher dose coreg for now as rate control is fine EP to see today.     For questions or updates, please contact [facility] [place] Please consult [email or web address] for contact info under Cardiology/STEMI.      Signed, Jenkins Rouge, MD  07/27/2017, 7:57 AM

## 2017-07-27 NOTE — Discharge Instructions (Addendum)
Atrial Fibrillation Atrial fibrillation is a type of irregular or rapid heartbeat (arrhythmia). In atrial fibrillation, the heart quivers continuously in a chaotic pattern. This occurs when parts of the heart receive disorganized signals that make the heart unable to pump blood normally. This can increase the risk for stroke, heart failure, and other heart-related conditions. There are different types of atrial fibrillation, including:  Paroxysmal atrial fibrillation. This type starts suddenly, and it usually stops on its own shortly after it starts.  Persistent atrial fibrillation. This type often lasts longer than a week. It may stop on its own or with treatment.  Long-lasting persistent atrial fibrillation. This type lasts longer than 12 months.  Permanent atrial fibrillation. This type does not go away.  Talk with your health care provider to learn about the type of atrial fibrillation that you have. What are the causes? This condition is caused by some heart-related conditions or procedures, including:  A heart attack.  Coronary artery disease.  Heart failure.  Heart valve conditions.  High blood pressure.  Inflammation of the sac that surrounds the heart (pericarditis).  Heart surgery.  Certain heart rhythm disorders, such as Wolf-Parkinson-White syndrome.  Other causes include:  Pneumonia.  Obstructive sleep apnea.  Blockage of an artery in the lungs (pulmonary embolism, or PE).  Lung cancer.  Chronic lung disease.  Thyroid problems, especially if the thyroid is overactive (hyperthyroidism).  Caffeine.  Excessive alcohol use or illegal drug use.  Use of some medicines, including certain decongestants and diet pills.  Sometimes, the cause cannot be found. What increases the risk? This condition is more likely to develop in:  People who are older in age.  People who smoke.  People who have diabetes mellitus.  People who are overweight  (obese).  Athletes who exercise vigorously.  What are the signs or symptoms? Symptoms of this condition include:  A feeling that your heart is beating rapidly or irregularly.  A feeling of discomfort or pain in your chest.  Shortness of breath.  Sudden light-headedness or weakness.  Getting tired easily during exercise.  In some cases, there are no symptoms. How is this diagnosed? Your health care provider may be able to detect atrial fibrillation when taking your pulse. If detected, this condition may be diagnosed with:  An electrocardiogram (ECG).  A Holter monitor test that records your heartbeat patterns over a 24-hour period.  Transthoracic echocardiogram (TTE) to evaluate how blood flows through your heart.  Transesophageal echocardiogram (TEE) to view more detailed images of your heart.  A stress test.  Imaging tests, such as a CT scan or chest X-ray.  Blood tests.  How is this treated? The main goals of treatment are to prevent blood clots from forming and to keep your heart beating at a normal rate and rhythm. The type of treatment that you receive depends on many factors, such as your underlying medical conditions and how you feel when you are experiencing atrial fibrillation. This condition may be treated with:  Medicine to slow down the heart rate, bring the hearts rhythm back to normal, or prevent clots from forming.  Electrical cardioversion. This is a procedure that resets your hearts rhythm by delivering a controlled, low-energy shock to the heart through your skin.  Different types of ablation, such as catheter ablation, catheter ablation with pacemaker, or surgical ablation. These procedures destroy the heart tissues that send abnormal signals. When the pacemaker is used, it is placed under your skin to help your heart beat in  a regular rhythm.  Follow these instructions at home:  Take over-the counter and prescription medicines only as told by your  health care provider.  If your health care provider prescribed a blood-thinning medicine (anticoagulant), take it exactly as told. Taking too much blood-thinning medicine can cause bleeding. If you do not take enough blood-thinning medicine, you will not have the protection that you need against stroke and other problems.  Do not use tobacco products, including cigarettes, chewing tobacco, and e-cigarettes. If you need help quitting, ask your health care provider.  If you have obstructive sleep apnea, manage your condition as told by your health care provider.  Do not drink alcohol.  Do not drink beverages that contain caffeine, such as coffee, soda, and tea.  Maintain a healthy weight. Do not use diet pills unless your health care provider approves. Diet pills may make heart problems worse.  Follow diet instructions as told by your health care provider.  Exercise regularly as told by your health care provider.  Keep all follow-up visits as told by your health care provider. This is important. How is this prevented?  Avoid drinking beverages that contain caffeine or alcohol.  Avoid certain medicines, especially medicines that are used for breathing problems.  Avoid certain herbs and herbal medicines, such as those that contain ephedra or ginseng.  Do not use illegal drugs, such as cocaine and amphetamines.  Do not smoke.  Manage your high blood pressure. Contact a health care provider if:  You notice a change in the rate, rhythm, or strength of your heartbeat.  You are taking an anticoagulant and you notice increased bruising.  You tire more easily when you exercise or exert yourself. Get help right away if:  You have chest pain, abdominal pain, sweating, or weakness.  You feel nauseous.  You notice blood in your vomit, bowel movement, or urine.  You have shortness of breath.  You suddenly have swollen feet and ankles.  You feel dizzy.  You have sudden weakness or  numbness of the face, arm, or leg, especially on one side of the body.  You have trouble speaking, trouble understanding, or both (aphasia).  Your face or your eyelid droops on one side. These symptoms may represent a serious problem that is an emergency. Do not wait to see if the symptoms will go away. Get medical help right away. Call your local emergency services (911 in the [place] Do not drive yourself to the hospital. This information is not intended to replace advice given to you by your health care provider. Make sure you discuss any questions you have with your health care provider. Document Released: 08/01/2005 Document Revised: 12/09/2015 Document Reviewed: 11/26/2014 Elsevier Interactive Patient Education  2017 [place] on my medicine - XARELTO (Rivaroxaban)  This medication education was reviewed with me or my healthcare representative as part of my discharge preparation.  The pharmacist that spoke with me during my hospital stay was:  Betsey Holiday, [facility]  Why was Xarelto prescribed for you? Xarelto was prescribed for you to reduce the risk of a blood clot forming that can cause a stroke if you have a medical condition called atrial fibrillation (a type of irregular heartbeat).  What do you need to know about xarelto ? Take your Xarelto ONCE DAILY at the same time every day with your evening meal. If you have difficulty swallowing the tablet whole, you may crush it and mix in applesauce just prior to taking your dose.  Take  Xarelto exactly as prescribed by your doctor and DO NOT stop taking Xarelto without talking to the doctor who prescribed the medication.  Stopping without other stroke prevention medication to take the place of Xarelto may increase your risk of developing a clot that causes a stroke.  Refill your prescription before you run out.  After discharge, you should have regular check-up appointments with your healthcare provider that is  prescribing your Xarelto.  In the future your dose may need to be changed if your kidney function or weight changes by a significant amount.  What do you do if you miss a dose? If you are taking Xarelto ONCE DAILY and you miss a dose, take it as soon as you remember on the same day then continue your regularly scheduled once daily regimen the next day. Do not take two doses of Xarelto at the same time or on the same day.   Important Safety Information A possible side effect of Xarelto is bleeding. You should call your healthcare provider right away if you experience any of the following: ? Bleeding from an injury or your nose that does not stop. ? Unusual colored urine (red or dark brown) or unusual colored stools (red or black). ? Unusual bruising for unknown reasons. ? A serious fall or if you hit your head (even if there is no bleeding).  Some medicines may interact with Xarelto and might increase your risk of bleeding while on Xarelto. To help avoid this, consult your healthcare provider or pharmacist prior to using any new prescription or non-prescription medications, including herbals, vitamins, non-steroidal anti-inflammatory drugs (NSAIDs) and supplements.  This website has more information on Xarelto: [email or web address]

## 2017-07-27 NOTE — Patient Outreach (Signed)
[facility]) Care Management  07/27/2017  [name] [name] 11-May-1946 [id number]   Telephone Screen  Referral Date:  07/25/2017 Referral Source:  [facility] ED Census Reason for Referral:  6 or more ED visits in the past 6 months Insurance:  [employer]   Outreach Attempt:  Patient currently inpatient at [facility] for atrial fibrillation with rapid ventricular response and acute congestive heart failure exacerbation.  Plan:  RN Health Coach will notify Hospital Liaison of patient's admission for possible outreach in hospital to offer [name] services.  [place] [phone] [email or web address]

## 2017-07-27 NOTE — Consult Note (Signed)
ELECTROPHYSIOLOGY CONSULT NOTE    Patient ID: BIRDA DIDONATO MRN: [id number], DOB/AGE: 18-Nov-1945 71 y.o.  Admit date: 07/26/2017 Date of Consult: 07/27/2017  Primary Physician: [name], MD Primary Cardiologist: [place] Electrophysiologist: Idora Brosious (new this admission)  Patient Profile: [name] [name] is a 71 y.o. female with a history of CAD s/p CABG, persistent atrial fibrillation s/p LAA clipping at time of CABG, hypertension, hyperlipidemia, and ICM who is being seen today for the evaluation of atrial fibrillation at the request of Dr Johnsie Cancel.  HPI:  [name] [name] is a 71 y.o. female who was admitted yesterday for shortness of breath and intermittent palpitations. Past medical history is as outlined above. She was seen in the office recently and found to be in AF with rate control meds adjusted at that time. On arrival to ER, ventricular rates were 130-140's. She was placed on diltiazem drip with improvement in rate control. She was originally on amiodarone post CABG, but this was discontinued. She is feeling better this morning without chest pain or shortness of breath.   Echo May 2018 demonstrated EF 50-55%, mild MR, LA 48.  She denies chest pain, palpitations, dyspnea, PND, orthopnea, nausea, vomiting, dizziness, syncope, edema, weight gain, or early satiety.  Past Medical History:  Diagnosis Date  . Arthritis   . Asthma   . Cardiomyopathy, ischemic    LVEF 50-55% as of May 2018  . Chronic combined systolic and diastolic heart failure ([place])   . Hypertension   . Left main coronary artery disease 11/04/2014  . PAF (paroxysmal atrial fibrillation) ([place])   . S/P CABG x 2 with clipping of LA appendage 11/14/2014   LIMA to LAD, SVG to OM, EVH via right thigh     Surgical History:  Past Surgical History:  Procedure Laterality Date  . CARDIOVERSION N/A 11/18/2014   Procedure: CARDIOVERSION;  Surgeon: [name], MD;  Location: [place];  Service: Cardiovascular;  Laterality:  N/A;  . CLIPPING OF ATRIAL APPENDAGE N/A 11/14/2014   Procedure: CLIPPING OF ATRIAL APPENDAGE;  Surgeon: [name], MD;  Location: [place];  Service: Open Heart Surgery;  Laterality: N/A;  . CORONARY ARTERY BYPASS GRAFT N/A 11/14/2014   Procedure: CORONARY ARTERY BYPASS GRAFTING (CABG)TIMES 2 USING LEFT INTERNAL MAMMARY ARTERY AND RIGHT SAPHENOUS VEIN HARVESTED ENDOSCOPICALLY;  Surgeon: [name], MD;  Location: [place];  Service: Open Heart Surgery;  Laterality: N/A;  . LEFT HEART CATHETERIZATION WITH CORONARY ANGIOGRAM N/A 11/04/2014   Procedure: LEFT HEART CATHETERIZATION WITH CORONARY ANGIOGRAM;  Surgeon: Troy Sine, MD;  Location: [facility] CATH LAB;  Service: Cardiovascular;  Laterality: N/A;  . TEE WITHOUT CARDIOVERSION N/A 11/14/2014   Procedure: TRANSESOPHAGEAL ECHOCARDIOGRAM (TEE);  Surgeon: [name], MD;  Location: [place];  Service: Open Heart Surgery;  Laterality: N/A;  . TEMPORARY PACEMAKER INSERTION  11/04/2014   Procedure: TEMPORARY PACEMAKER INSERTION;  Surgeon: Troy Sine, MD;  Location: [facility] CATH LAB;  Service: Cardiovascular;;     Medications Prior to Admission  Medication Sig Dispense Refill Last Dose  . acetaminophen (TYLENOL) 500 MG tablet Take 500 mg by mouth every 6 (six) hours as needed for moderate pain.    07/25/2017 at prn  . aspirin EC 81 MG EC tablet Take 1 tablet (81 mg total) by mouth daily.   07/25/2017 at Unknown time  . atorvastatin (LIPITOR) 10 MG tablet TAKE 1 TABLET EVERY DAY  AT  6PM (Patient taking differently: TAKE 10 mg TABLET EVERY DAY) 90  tablet 3 07/25/2017 at Unknown time  . carvedilol (COREG) 12.5 MG tablet Take 1.5 tablets (18.75 mg total) by mouth 2 (two) times daily. (Patient taking differently: Take 12.5 mg by mouth 2 (two) times daily. ) 180 tablet 1 07/25/2017 at 0900  . ranitidine (ZANTAC) 150 MG capsule Take 150 mg by mouth daily as needed for heartburn.    07/24/2017 at prn  . triamterene-hydrochlorothiazide (DYAZIDE) 37.5-25 MG capsule  Take 1 capsule by mouth daily.   07/25/2017 at Unknown time  . [name] 40 MG tablet Take 20 mg by mouth daily as needed (gout flare up).    07/25/2017 at prn  . verapamil (CALAN-SR) 120 MG CR tablet Take 1 tablet (120 mg total) by mouth at bedtime. 30 tablet 3 07/22/2017    Inpatient Medications:  . aspirin EC  81 mg Oral Daily  . atorvastatin  10 mg Oral q1800  . carvedilol  18.75 mg Oral BID WC  . enoxaparin (LOVENOX) injection  95 mg Subcutaneous Q12H  . furosemide  40 mg Oral Daily    Allergies:  Allergies  Allergen Reactions  . Bee Venom Anaphylaxis  . Codeine Nausea And Vomiting  . Shrimp [Shellfish Allergy] Swelling  . Tomato     Pt states it gives her gout    Social History   Socioeconomic History  . Marital status: Widowed    Spouse name: Not on file  . Number of children: Not on file  . Years of education: Not on file  . Highest education level: Not on file  Social Needs  . Financial resource strain: Not on file  . Food insecurity - worry: Not on file  . Food insecurity - inability: Not on file  . Transportation needs - medical: Not on file  . Transportation needs - non-medical: Not on file  Occupational History  . Not on file  Tobacco Use  . Smoking status: Current Some Day Smoker    Packs/day: 1.00    Years: 32.00    Pack years: 32.00    Types: Cigarettes  . Smokeless tobacco: Never Used  . Tobacco comment: occasional puff here and there - 02/09/15  Substance and Sexual Activity  . Alcohol use: No    Alcohol/week: 0.0 oz  . Drug use: No  . Sexual activity: Not on file  Other Topics Concern  . Not on file  Social History Narrative  . Not on file     Family History  Problem Relation Age of Onset  . Cancer Mother   . Heart disease Father      Review of Systems: All other systems reviewed and are otherwise negative except as noted above.  Physical Exam: Vitals:   07/26/17 2230 07/26/17 2232 07/26/17 2303 07/27/17 0432  BP:  (!) 132/107 116/88  (!) 131/92  Pulse: (!) 106 (!) 51    Resp:  (!) 23 (!) 22 20  Temp:   98 F (36.7 C) 97.6 F (36.4 C)  TempSrc:   Oral   SpO2: 97% 99% 99% 96%  Weight:   216 lb 8 oz (98.2 kg) 216 lb 7.9 oz (98.2 kg)  Height:   5\' 7"  (1.702 m)     GEN- The patient is well appearing, alert and oriented x 3 today.   HEENT: normocephalic, atraumatic; sclera clear, conjunctiva pink; hearing intact; oropharynx clear; neck supple Lungs- Clear to ausculation bilaterally, normal work of breathing.  No wheezes, rales, rhonchi Heart- Irregular rate and rhythm, no murmurs, rubs or gallops  GI- soft, non-tender, non-distended, bowel sounds present Extremities- no clubbing, cyanosis, or edema  MS- no significant deformity or atrophy Skin- warm and dry, no rash or lesion Psych- euthymic mood, full affect Neuro- strength and sensation are intact  Labs:   Lab Results  Component Value Date   WBC 9.0 07/27/2017   HGB 14.9 07/27/2017   HCT 43.9 07/27/2017   MCV 89.8 07/27/2017   PLT 120 (L) 07/27/2017    Recent Labs  Lab 07/26/17 1631  NA 140  K 3.2*  CL 110  CO2 20*  BUN 28*  CREATININE 1.43*  CALCIUM 8.9  PROT 6.5  BILITOT 1.3*  ALKPHOS 244*  ALT 53  AST 39  GLUCOSE 126*      Radiology/Studies: Dg Chest 2 View  Result Date: 07/22/2017 CLINICAL DATA:  Chest pain. EXAM: CHEST  2 VIEW COMPARISON:  June 23, 2017 FINDINGS: Cardiomegaly. Atelectasis or scar in the lingula, unchanged. No suspicious nodules, masses, or acute infiltrates. Pulmonary venous congestion identified. No overt edema. IMPRESSION: Cardiomegaly and pulmonary venous congestion. Electronically Signed   By: Dorise Bullion III M.D   On: 07/22/2017 19:39   Ct Chest Wo Contrast  Result Date: 07/22/2017 CLINICAL DATA:  Chest pain.  Shortness of breath. EXAM: CT CHEST WITHOUT CONTRAST TECHNIQUE: Multidetector CT imaging of the chest was performed following the standard protocol without IV contrast. COMPARISON:  Chest x-ray July 22, 2017 FINDINGS: Cardiovascular: Coronary artery calcifications are identified. Cardiomegaly is noted. The thoracic aorta demonstrates atherosclerotic change but no aneurysm. The central pulmonary arteries are normal in caliber. Mediastinum/Nodes: Shotty nodes in the mediastinum are identified. A precarinal node on axial image 62 measures 14 mm. No effusions. The esophagus and thyroid are unremarkable. Lungs/Pleura: Central airways are normal. No pneumothorax. Atelectasis in the lingula. Mild interstitial prominence and interlobular septal thickening is most consistent with mild edema/ pulmonary venous congestion. No suspicious nodule or mass. No focal infiltrate. Upper Abdomen: No acute abnormality. Musculoskeletal: No chest wall mass or suspicious bone lesions identified. IMPRESSION: 1. Cardiomegaly and mild edema/pulmonary venous congestion. 2. Atherosclerosis in the nonaneurysmal aorta. Coronary artery calcifications. 3. Shotty and mildly enlarged mediastinal nodes are likely reactive. Aortic Atherosclerosis (ICD10-I70.0). Electronically Signed   By: Dorise Bullion III M.D   On: 07/22/2017 21:28   Dg Chest Port 1 View  Result Date: 07/26/2017 CLINICAL DATA:  Shortness of breath for 2 weeks. EXAM: PORTABLE CHEST 1 VIEW COMPARISON:  Chest radiograph 08/01/2017. FINDINGS: Cardiomegaly. Mild vascular congestion. No edema or consolidation. Prior median sternotomy. No osseous findings. Prior CABG, with atrial appendage clip. IMPRESSION: Cardiomegaly with mild vascular congestion, overall slightly improved from priors. No consolidation or edema. Electronically Signed   By: Staci Righter M.D.   On: 07/26/2017 16:48    EKG:AF, rate 101 (personally reviewed)  TELEMETRY: rate controlled AF, short run NSVT (personally reviewed)  Assessment/Plan: 1.  Persistent atrial fibrillation Rates improved on IV Diltiazem Convert to po diltiazem today - she has tolerated up to 240mg  daily in the past (?rash/diarrhea as  side effects, but not clearly related).  She will need to be on [facility] for attempts at restoration of SR Start Xarelto 20mg  daily today for [facility] of 4 Follow up in AF clinic in 2 weeks to evaluate for Tikosyn admission Needs outpatient sleep study   2.  CAD s/p CABG No recent ischemic symptoms Continue medical therapy   3. HTN Stable No change required today  Electrophysiology team to see as needed while here. AF  clinic follow up arranged.  Please call with questions.   Signed, Chanetta Marshall 07/27/2017 8:13 AM  I have seen, examined the patient, and reviewed the above assessment and plan.  Changes to above are made where necessary. On exam, overweight.  IRRR.  Pt with persistent afib for months.  I worry that our ability to maintain sinus rhythm long term may be low.  For now, would transition IV cardizem to oral.  Initiate [place] and follow-up in AF clinic in 3 weeks for consideration of tikosyn vs amiodarone.  Lifestyle modification is encouraged.  Would also advise outpatient sleep study.  Electrophysiology team to see as needed while here. Please call with questions.   Co Sign: Thompson Grayer, MD 07/27/2017 8:06 PM

## 2017-07-27 NOTE — Telephone Encounter (Signed)
Patient currently admitted to hospital.

## 2017-07-28 ENCOUNTER — Ambulatory Visit: Payer: Self-pay | Admitting: *Deleted

## 2017-07-28 ENCOUNTER — Inpatient Hospital Stay (HOSPITAL_COMMUNITY): Payer: PPO

## 2017-07-28 LAB — BASIC METABOLIC PANEL
Anion gap: 12 (ref 5–15)
BUN: 24 mg/dL — ABNORMAL HIGH (ref 6–20)
CO2: 21 mmol/L — ABNORMAL LOW (ref 22–32)
Calcium: 9 mg/dL (ref 8.9–10.3)
Chloride: 109 mmol/L (ref 101–111)
Creatinine, Ser: 1.23 mg/dL — ABNORMAL HIGH (ref 0.44–1.00)
GFR calc Af Amer: 50 mL/min — ABNORMAL LOW (ref >60)
GFR calc non Af Amer: 43 mL/min — ABNORMAL LOW (ref >60)
Glucose, Bld: 176 mg/dL — ABNORMAL HIGH (ref 65–99)
Potassium: 3 mmol/L — ABNORMAL LOW (ref 3.5–5.1)
Sodium: 142 mmol/L (ref 135–145)

## 2017-07-28 LAB — BRAIN NATRIURETIC PEPTIDE: B Natriuretic Peptide: 373.7 pg/mL — ABNORMAL HIGH (ref 0.0–100.0)

## 2017-07-28 MED ORDER — SALINE SPRAY 0.65 % NA SOLN
1.0000 | NASAL | Status: DC | PRN
  Filled 2017-07-28: qty 44

## 2017-07-28 MED ORDER — DILTIAZEM HCL ER COATED BEADS 240 MG PO CP24
240.0000 mg | ORAL_CAPSULE | Freq: Every day | ORAL | Status: DC
  Administered 2017-07-28 – 2017-07-29 (×2): 240 mg via ORAL
  Filled 2017-07-28 (×2): qty 1

## 2017-07-28 MED ORDER — FUROSEMIDE 10 MG/ML IJ SOLN
40.0000 mg | Freq: Once | INTRAMUSCULAR | Status: AC
  Administered 2017-07-28: 40 mg via INTRAVENOUS
  Filled 2017-07-28: qty 4

## 2017-07-28 MED ORDER — FLUTICASONE PROPIONATE 50 MCG/ACT NA SUSP
1.0000 | Freq: Every day | NASAL | Status: DC
  Filled 2017-07-28: qty 16

## 2017-07-28 MED ORDER — MAGNESIUM OXIDE 400 (241.3 MG) MG PO TABS
400.0000 mg | ORAL_TABLET | Freq: Two times a day (BID) | ORAL | Status: DC
  Administered 2017-07-28 – 2017-07-29 (×3): 400 mg via ORAL
  Filled 2017-07-28 (×3): qty 1

## 2017-07-28 MED ORDER — FUROSEMIDE 10 MG/ML IJ SOLN
40.0000 mg | Freq: Once | INTRAMUSCULAR | Status: DC
Start: 2017-07-28 — End: 2017-07-29
  Filled 2017-07-28: qty 4

## 2017-07-28 MED ORDER — POTASSIUM CHLORIDE CRYS ER 20 MEQ PO TBCR
40.0000 meq | EXTENDED_RELEASE_TABLET | Freq: Two times a day (BID) | ORAL | Status: DC
  Administered 2017-07-28 – 2017-07-29 (×3): 40 meq via ORAL
  Filled 2017-07-28 (×3): qty 2

## 2017-07-28 NOTE — Progress Notes (Signed)
Pt became SOB while talking. 02 sats 100%RA. Lower lung diminished. PA on call made aware. Pt's SOB resolved while resting in bed.

## 2017-07-28 NOTE — Progress Notes (Signed)
Progress Note  Patient Name: [name] [name] Date of Encounter: 07/28/2017  Primary Cardiologist: No primary care provider on file.   Subjective   More dyspnea this am palpitations better   Inpatient Medications    Scheduled Meds: . atorvastatin  10 mg Oral q1800  . carvedilol  18.75 mg Oral BID WC  . diltiazem  240 mg Oral Daily  . furosemide  40 mg Oral Daily  . oxymetazoline  1 spray Each Nare BID  . rivaroxaban  20 mg Oral Q supper   Continuous Infusions:  PRN Meds: acetaminophen, alum & mag hydroxide-simeth, ondansetron (ZOFRAN) IV, sodium phosphate   Vital Signs    Vitals:   07/27/17 2025 07/28/17 0059 07/28/17 0500 07/28/17 0803  BP: 110/62 (!) 141/93 121/63 (!) 124/91  Pulse: 91 89 (!) 116 (!) 122  Resp: 18 20 18 20   Temp: 97.9 F (36.6 C) (!) 97.5 F (36.4 C) 97.8 F (36.6 C) 97.6 F (36.4 C)  TempSrc: Oral Oral Oral Oral  SpO2: 94% 100% 96% 97%  Weight:   215 lb 4.8 oz (97.7 kg)   Height:        Intake/Output Summary (Last 24 hours) at 07/28/2017 0823 Last data filed at 07/27/2017 2100 Gross per 24 hour  Intake 1243.25 ml  Output 700 ml  Net 543.25 ml   Filed Weights   07/26/17 2303 07/27/17 0432 07/28/17 0500  Weight: 216 lb 8 oz (98.2 kg) 216 lb 7.9 oz (98.2 kg) 215 lb 4.8 oz (97.7 kg)    Telemetry    afib rate 80-95- Personally Reviewed  ECG    Afib nonspecific ST changes corrected QT 513   - Personally Reviewed  Physical Exam  Overweight black female  GEN: No acute distress.   Neck: No JVD Cardiac: RRR, no murmurs, rubs, or gallops.  Respiratory: Clear to auscultation bilaterally. GI: Soft, nontender, non-distended  MS: No edema; No deformity. Neuro:  Nonfocal  Psych: Normal affect   Labs    Chemistry Recent Labs  Lab 07/22/17 1924 07/26/17 1631 07/27/17 0947  NA 138 140 140  K 4.0 3.2* 3.0*  CL 112* 110 107  CO2 19* 20* 22  GLUCOSE 149* 126* 135*  BUN 27* 28* 27*  CREATININE 1.50* 1.43* 1.32*  CALCIUM 7.6* 8.9  9.0  PROT  --  6.5  --   ALBUMIN  --  3.0*  --   AST  --  39  --   ALT  --  53  --   ALKPHOS  --  244*  --   BILITOT  --  1.3*  --   GFRNONAA 34* 36* 39*  GFRAA 39* 41* 45*  ANIONGAP 7 10 11      Hematology Recent Labs  Lab 07/22/17 1924 07/26/17 1631 07/27/17 0529  WBC 11.4* 9.6 9.0  RBC 4.65 4.97 4.89  HGB 13.8 14.8 14.9  HCT 41.1 43.6 43.9  MCV 88.4 87.7 89.8  MCH 29.7 29.8 30.5  MCHC 33.6 33.9 33.9  RDW 16.9* 17.0* 17.2*  PLT 122* 136* 120*    Cardiac EnzymesNo results for input(s): TROPONINI in the last 168 hours.  Recent Labs  Lab 07/26/17 1647  TROPIPOC 0.01     BNP Recent Labs  Lab 07/22/17 2215 07/26/17 1631  BNP 619.1* 825.8*     DDimer No results for input(s): DDIMER in the last 168 hours.   Radiology    Dg Chest Port 1 View  Result Date: 07/26/2017 CLINICAL DATA:  Shortness  of breath for 2 weeks. EXAM: PORTABLE CHEST 1 VIEW COMPARISON:  Chest radiograph 08/01/2017. FINDINGS: Cardiomegaly. Mild vascular congestion. No edema or consolidation. Prior median sternotomy. No osseous findings. Prior CABG, with atrial appendage clip. IMPRESSION: Cardiomegaly with mild vascular congestion, overall slightly improved from priors. No consolidation or edema. Electronically Signed   By: Staci Righter M.D.   On: 07/26/2017 16:48    Cardiac Studies   01/11/17 EF 50-55% mild MR LA 48 mm   Patient Profile   [name] [name] is a 71 y.o. female with a history of CAD status post CABG in 2016 with LIMA to LAD and SVG to OM, paroxysmal atrial fibrillation with CHADSVASC score of 5 (not anticoagulated status post clipping of the left atrial appendage), hypertension, hyperlipidemia, and ischemic cardiomyopathy with improvement in LVEF from the range of 35-40% up to 50-55% as of May 2018.   Assessment & Plan    AFib: seems more persistent to me All her ECG;s from May on show afib. Xarelto Started yesterday Cannot use AAT/ DCC until anticoagulated. Have increased  coreg And cardizem for rate control More dyspnea this am mild volume overload continue Lasix. Check CXR. Sats ok. Possible d/c home in am has f/u in afib clinic and to  Start Tikosyn per EP after on anticoagulation EF by echo May 50-55% K low supplement Recheck this am    For questions or updates, please contact [place] Please consult [email or web address] for contact info under Cardiology/STEMI.      Signed, Jenkins Rouge, MD  07/28/2017, 8:23 AM

## 2017-07-29 LAB — CBC
HCT: 43.1 % (ref 36.0–46.0)
Hemoglobin: 14.3 g/dL (ref 12.0–15.0)
MCH: 29.8 pg (ref 26.0–34.0)
MCHC: 33.2 g/dL (ref 30.0–36.0)
MCV: 89.8 fL (ref 78.0–100.0)
Platelets: 138 10*3/uL — ABNORMAL LOW (ref 150–400)
RBC: 4.8 MIL/uL (ref 3.87–5.11)
RDW: 17.2 % — ABNORMAL HIGH (ref 11.5–15.5)
WBC: 8.9 10*3/uL (ref 4.0–10.5)

## 2017-07-29 LAB — BASIC METABOLIC PANEL
Anion gap: 11 (ref 5–15)
BUN: 24 mg/dL — ABNORMAL HIGH (ref 6–20)
CO2: 24 mmol/L (ref 22–32)
Calcium: 8.9 mg/dL (ref 8.9–10.3)
Chloride: 107 mmol/L (ref 101–111)
Creatinine, Ser: 1.25 mg/dL — ABNORMAL HIGH (ref 0.44–1.00)
GFR calc Af Amer: 49 mL/min — ABNORMAL LOW (ref >60)
GFR calc non Af Amer: 42 mL/min — ABNORMAL LOW (ref >60)
Glucose, Bld: 132 mg/dL — ABNORMAL HIGH (ref 65–99)
Potassium: 3.5 mmol/L (ref 3.5–5.1)
Sodium: 142 mmol/L (ref 135–145)

## 2017-07-29 LAB — MAGNESIUM: Magnesium: 1.7 mg/dL (ref 1.7–2.4)

## 2017-07-29 MED ORDER — DILTIAZEM HCL ER COATED BEADS 240 MG PO CP24: 240.0000 mg | ORAL_CAPSULE | Freq: Every day | ORAL | 4 refills | Status: DC

## 2017-07-29 MED ORDER — POTASSIUM CHLORIDE CRYS ER 20 MEQ PO TBCR: 40.0000 meq | EXTENDED_RELEASE_TABLET | Freq: Two times a day (BID) | ORAL | 4 refills | Status: DC

## 2017-07-29 MED ORDER — FUROSEMIDE 40 MG PO TABS: 40.0000 mg | ORAL_TABLET | Freq: Every day | ORAL | 6 refills | Status: DC

## 2017-07-29 MED ORDER — MAGNESIUM OXIDE 400 (241.3 MG) MG PO TABS: 400.0000 mg | ORAL_TABLET | Freq: Two times a day (BID) | ORAL | 3 refills | Status: DC

## 2017-07-29 MED ORDER — CARVEDILOL 6.25 MG PO TABS: 18.7500 mg | ORAL_TABLET | Freq: Two times a day (BID) | ORAL | 4 refills | Status: DC

## 2017-07-29 MED ORDER — FLUTICASONE PROPIONATE 50 MCG/ACT NA SUSP: 1.0000 | Freq: Every day | NASAL | 2 refills | Status: DC

## 2017-07-29 MED ORDER — RIVAROXABAN 20 MG PO TABS: 20.0000 mg | ORAL_TABLET | Freq: Every day | ORAL | 4 refills | Status: DC

## 2017-07-29 NOTE — Plan of Care (Signed)
Pt remains in A-fib with controlled rate. No acute issues at this time.

## 2017-07-29 NOTE — Progress Notes (Signed)
Progress Note  Patient Name: [name] [name] Date of Encounter: 07/29/2017  Primary Cardiologist: Mali Hilty MD  Subjective   Notes BP is up this am. Doesn't like all the pills she takes. She is chronically SOB but doesn't feel this has changed. She is anxious to go home.  Inpatient Medications    Scheduled Meds: . atorvastatin  10 mg Oral q1800  . carvedilol  18.75 mg Oral BID WC  . diltiazem  240 mg Oral Daily  . fluticasone  1 spray Each Nare Daily  . furosemide  40 mg Intravenous Once  . furosemide  40 mg Oral Daily  . magnesium oxide  400 mg Oral BID  . oxymetazoline  1 spray Each Nare BID  . potassium chloride  40 mEq Oral BID  . rivaroxaban  20 mg Oral Q supper   Continuous Infusions:  PRN Meds: acetaminophen, alum & mag hydroxide-simeth, ondansetron (ZOFRAN) IV, sodium chloride, sodium phosphate   Vital Signs    Vitals:   07/29/17 0600 07/29/17 0753 07/29/17 1053 07/29/17 1220  BP: 122/79 93/68 126/87 (!) 146/115  Pulse: (!) 109 97 86 88  Resp:  18  18  Temp:      TempSrc:  Oral    SpO2: 93% 100% 90% 97%  Weight:      Height:        Intake/Output Summary (Last 24 hours) at 07/29/2017 1233 Last data filed at 07/29/2017 0842 Gross per 24 hour  Intake 1020 ml  Output -  Net 1020 ml   Filed Weights   07/26/17 2303 07/27/17 0432 07/28/17 0500  Weight: 216 lb 8 oz (98.2 kg) 216 lb 7.9 oz (98.2 kg) 215 lb 4.8 oz (97.7 kg)    Telemetry    afib rate 100-115- Personally Reviewed  ECG    None today.  Physical Exam  Obese black female  GEN: No acute distress.   Neck: No JVD Cardiac: IRRR, no murmurs, rubs, or gallops.  Respiratory: Clear to auscultation bilaterally. GI: Soft, nontender, non-distended  MS: No edema; No deformity. Neuro:  Nonfocal  Psych: Normal affect   Labs    Chemistry Recent Labs  Lab 07/26/17 1631 07/27/17 0947 07/28/17 0930 07/29/17 0359  NA 140 140 142 142  K 3.2* 3.0* 3.0* 3.5  CL 110 107 109 107  CO2 20* 22  21* 24  GLUCOSE 126* 135* 176* 132*  BUN 28* 27* 24* 24*  CREATININE 1.43* 1.32* 1.23* 1.25*  CALCIUM 8.9 9.0 9.0 8.9  PROT 6.5  --   --   --   ALBUMIN 3.0*  --   --   --   AST 39  --   --   --   ALT 53  --   --   --   ALKPHOS 244*  --   --   --   BILITOT 1.3*  --   --   --   GFRNONAA 36* 39* 43* 42*  GFRAA 41* 45* 50* 49*  ANIONGAP 10 11 12 11      Hematology Recent Labs  Lab 07/26/17 1631 07/27/17 0529 07/29/17 0359  WBC 9.6 9.0 8.9  RBC 4.97 4.89 4.80  HGB 14.8 14.9 14.3  HCT 43.6 43.9 43.1  MCV 87.7 89.8 89.8  MCH 29.8 30.5 29.8  MCHC 33.9 33.9 33.2  RDW 17.0* 17.2* 17.2*  PLT 136* 120* 138*    Cardiac EnzymesNo results for input(s): TROPONINI in the last 168 hours.  Recent Labs  Lab 07/26/17  1647  TROPIPOC 0.01     BNP Recent Labs  Lab 07/22/17 2215 07/26/17 1631 07/28/17 0930  BNP 619.1* 825.8* 373.7*     DDimer No results for input(s): DDIMER in the last 168 hours.   Radiology    Dg Chest 2 View  Result Date: 07/28/2017 CLINICAL DATA:  History of shortness of breath for several days. EXAM: CHEST  2 VIEW COMPARISON:  07/26/2017 FINDINGS: Postsurgical changes from CABG. Left atrial appendage closure device, stable. Enlarged cardiac silhouette, stable. Mild interstitial pulmonary edema.  No large pleural effusion. Osseous structures are without acute abnormality. Soft tissues are grossly normal. IMPRESSION: Enlarged cardiac silhouette, with mild interstitial pulmonary edema. Electronically Signed   By: Fidela Salisbury M.D.   On: 07/28/2017 10:44    Cardiac Studies   01/11/17 EF 50-55% mild MR LA 48 mm   Patient Profile   [name] [name] is a 71 y.o. female with a history of CAD status post CABG in 2016 with LIMA to LAD and SVG to OM, paroxysmal atrial fibrillation with CHADSVASC score of 5 (not anticoagulated status post clipping of the left atrial appendage), hypertension, hyperlipidemia, and ischemic cardiomyopathy with improvement in LVEF from  the range of 35-40% up to 50-55% as of May 2018.   Assessment & Plan    1. AFib: Persistent.  Started on anticoagulation with Xarelto.  Seen by EP yesterday. Plan to continue rate control strategy for now. After period of anticoagulation then consider AAD therapy with Tikosyn load.  1. Chronic dyspnea. On lasix 40 mg daily. Volume status looks at baseline. 3. CAD s/p CABG.  4. HLD 5. HTN. Generally under good control. Just received meds this am.  Plan; to DC home today on oral meds. Follow up in Afib clinic.     For questions or updates, please contact [place] Please consult [email or web address] for contact info under Cardiology/STEMI.      Signed, Chrisa Hassan Martinique, MD  07/29/2017, 12:33 PM

## 2017-07-29 NOTE — Progress Notes (Signed)
Pt has physical therapy ordered. Pt stated she dose not want to work with physical therapy and just wants to go home.

## 2017-07-29 NOTE — Discharge Summary (Signed)
Discharge Summary    Patient ID: [name] [name],  MRN: [id number], DOB/AGE: 11/06/1945 71 y.o.  Admit date: 07/26/2017 Discharge date: 07/29/2017  Primary Care Provider: [name] Primary Cardiologist: Mali Hilty, MD  Discharge Diagnoses    Active Problems:   Atrial fibrillation with RVR ([place])   Allergies Allergies  Allergen Reactions  . Bee Venom Anaphylaxis  . Codeine Nausea And Vomiting  . Shrimp [Shellfish Allergy] Swelling  . Tomato     Pt states it gives her gout    Diagnostic Studies/Procedures    None _____________   History of Present Illness   [name] is a 71 year old female patient with known history of hypertension with multiple medication adjustments, accessible atrial fibrillation CHADS VASC Score 5, status post left atrial appendage clipping, coronary artery disease status post CABG, hyperlipidemia, ischemic cardiomyopathy, with normal LV systolic function per echo and May 2018.  The patient presented to the hospital with complaints of increasing dyspnea and symptomatic chronic combined heart failure, with hypertension.  Hospital Course     Consultants: EP-Dr. Rayann Heman  _____________  The patient was seen and admitted by Dr. Rozann Lesches, and was found to be in rapid atrial fibrillation, placed on IV diltiazem drip.  Review of EKGs from May show that she was having persistent atrial fibrillation.  Carvedilol was increased to 18.75 mg twice daily.  On 07/27/2017 she was seen by electrophysiologist Dr. Rayann Heman.  At that point the patient's rates have improved on diltiazem, and the patient was converted to p.o. diltiazem 240 mg daily.  It was their recommendation that she begin on Xarelto 20 mg daily secondary to CHADS VASC Score of 5.  She was to have a follow-up visit in the A. fib clinic to evaluate for Tikosyn admission.  Was also recommended that the patient be placed on the sleep study scheduled to evaluate for obstructive sleep apnea.  On  day of discharge the patient was seen and examined by Dr. Peter Martinique.  Blood pressure was mildly elevated on evaluation, but no medication changes were made at that time.  The patient was anxious.  There is an issue with compliance which was worrisome.  Atrial fibrillation were between 101 115 bpm on review of telemetry.  She also had some mild dyspnea.  Patient was anxious to return home and Dr. Martinique felt it would be safe to do so.  She will need very close follow-up and the cardiology office, and referral to A. fib clinic as discussed above.  Medications were reviewed and sent to pharmacy electronically.   Discharge Vitals Blood pressure (!) 146/115, pulse 88, temperature 97.7 F (36.5 C), temperature source Oral, resp. rate 18, height 5\' 7"  (1.702 m), weight 215 lb 4.8 oz (97.7 kg), SpO2 97 %.  Filed Weights   07/26/17 2303 07/27/17 0432 07/28/17 0500  Weight: 216 lb 8 oz (98.2 kg) 216 lb 7.9 oz (98.2 kg) 215 lb 4.8 oz (97.7 kg)    Labs & Radiologic Studies     CBC Recent Labs    07/26/17 1631 07/27/17 0529 07/29/17 0359  WBC 9.6 9.0 8.9  NEUTROABS 6.3  --   --   HGB 14.8 14.9 14.3  HCT 43.6 43.9 43.1  MCV 87.7 89.8 89.8  PLT 136* 120* 867*   Basic Metabolic Panel Recent Labs    07/27/17 0947 07/28/17 0930 07/29/17 0359  NA 140 142 142  K 3.0* 3.0* 3.5  CL 107 109 107  CO2 22 21* 24  GLUCOSE 135* 176* 132*  BUN 27* 24* 24*  CREATININE 1.32* 1.23* 1.25*  CALCIUM 9.0 9.0 8.9  MG 1.7  --  1.7   Liver Function Tests Recent Labs    07/26/17 1631  AST 39  ALT 53  ALKPHOS 244*  BILITOT 1.3*  PROT 6.5  ALBUMIN 3.0*    Dg Chest 2 View  Result Date: 07/28/2017 CLINICAL DATA:  History of shortness of breath for several days. EXAM: CHEST  2 VIEW COMPARISON:  07/26/2017 FINDINGS: Postsurgical changes from CABG. Left atrial appendage closure device, stable. Enlarged cardiac silhouette, stable. Mild interstitial pulmonary edema.  No large pleural effusion. Osseous  structures are without acute abnormality. Soft tissues are grossly normal. IMPRESSION: Enlarged cardiac silhouette, with mild interstitial pulmonary edema. Electronically Signed   By: Fidela Salisbury M.D.   On: 07/28/2017 10:44   Dg Chest 2 View  Result Date: 07/22/2017 CLINICAL DATA:  Chest pain. EXAM: CHEST  2 VIEW COMPARISON:  June 23, 2017 FINDINGS: Cardiomegaly. Atelectasis or scar in the lingula, unchanged. No suspicious nodules, masses, or acute infiltrates. Pulmonary venous congestion identified. No overt edema. IMPRESSION: Cardiomegaly and pulmonary venous congestion. Electronically Signed   By: Dorise Bullion III M.D   On: 07/22/2017 19:39   Ct Chest Wo Contrast  Result Date: 07/22/2017 CLINICAL DATA:  Chest pain.  Shortness of breath. EXAM: CT CHEST WITHOUT CONTRAST TECHNIQUE: Multidetector CT imaging of the chest was performed following the standard protocol without IV contrast. COMPARISON:  Chest x-ray July 22, 2017 FINDINGS: Cardiovascular: Coronary artery calcifications are identified. Cardiomegaly is noted. The thoracic aorta demonstrates atherosclerotic change but no aneurysm. The central pulmonary arteries are normal in caliber. Mediastinum/Nodes: Shotty nodes in the mediastinum are identified. A precarinal node on axial image 62 measures 14 mm. No effusions. The esophagus and thyroid are unremarkable. Lungs/Pleura: Central airways are normal. No pneumothorax. Atelectasis in the lingula. Mild interstitial prominence and interlobular septal thickening is most consistent with mild edema/ pulmonary venous congestion. No suspicious nodule or mass. No focal infiltrate. Upper Abdomen: No acute abnormality. Musculoskeletal: No chest wall mass or suspicious bone lesions identified. IMPRESSION: 1. Cardiomegaly and mild edema/pulmonary venous congestion. 2. Atherosclerosis in the nonaneurysmal aorta. Coronary artery calcifications. 3. Shotty and mildly enlarged mediastinal nodes are likely  reactive. Aortic Atherosclerosis (ICD10-I70.0). Electronically Signed   By: Dorise Bullion III M.D   On: 07/22/2017 21:28   Dg Chest Port 1 View  Result Date: 07/26/2017 CLINICAL DATA:  Shortness of breath for 2 weeks. EXAM: PORTABLE CHEST 1 VIEW COMPARISON:  Chest radiograph 08/01/2017. FINDINGS: Cardiomegaly. Mild vascular congestion. No edema or consolidation. Prior median sternotomy. No osseous findings. Prior CABG, with atrial appendage clip. IMPRESSION: Cardiomegaly with mild vascular congestion, overall slightly improved from priors. No consolidation or edema. Electronically Signed   By: Staci Righter M.D.   On: 07/26/2017 16:48    Disposition   Pt is being discharged home today in good condition.  Follow-up Plans & Appointments    Follow-up Information    Hilty, Nadean Corwin, MD Follow up.   Specialty:  Cardiology Why:  Our office will call you for appointment Please bring all medications with you.  Contact information: [place] [address] [phone]        Sherran Needs, NP Follow up.   Specialties:  Nurse Practitioner, Cardiology Why:  Our office will call you for appointment  Contact information: [place] [address] [phone]  Discharge Instructions    Call MD for:  difficulty breathing, headache or visual disturbances   Complete by:  As directed    Call MD for:  persistant dizziness or light-headedness   Complete by:  As directed    Call MD for:  persistant nausea and vomiting   Complete by:  As directed    Call MD for:  redness, tenderness, or signs of infection (pain, swelling, redness, odor or green/yellow discharge around incision site)   Complete by:  As directed    Call MD for:  severe uncontrolled pain   Complete by:  As directed    Call MD for:  temperature >100.4   Complete by:  As directed    Diet - low sodium heart healthy   Complete by:  As directed    Diet - low sodium heart  healthy   Complete by:  As directed    Increase activity slowly   Complete by:  As directed    Increase activity slowly   Complete by:  As directed       Discharge Medications   Allergies as of 07/29/2017      Reactions   Bee Venom Anaphylaxis   Codeine Nausea And Vomiting   Shrimp [shellfish Allergy] Swelling   Tomato    Pt states it gives her gout      Medication List    STOP taking these medications   aspirin 81 MG EC tablet   verapamil 120 MG CR tablet Commonly known as:  CALAN-SR     TAKE these medications   acetaminophen 500 MG tablet Commonly known as:  TYLENOL Take 500 mg by mouth every 6 (six) hours as needed for moderate pain.   atorvastatin 10 MG tablet Commonly known as:  LIPITOR TAKE 1 TABLET EVERY DAY  AT  6PM What changed:  See the new instructions.   carvedilol 6.25 MG tablet Commonly known as:  COREG Take 3 tablets (18.75 mg total) by mouth 2 (two) times daily with a meal. What changed:    medication strength  when to take this   diltiazem 240 MG 24 hr capsule Commonly known as:  CARDIZEM CD Take 1 capsule (240 mg total) by mouth daily. Start taking on:  07/30/2017   fluticasone 50 MCG/ACT nasal spray Commonly known as:  FLONASE Place 1 spray into both nostrils daily. Start taking on:  07/30/2017   furosemide 40 MG tablet Commonly known as:  LASIX Take 1 tablet (40 mg total) by mouth daily. Start taking on:  07/30/2017   magnesium oxide 400 (241.3 Mg) MG tablet Commonly known as:  MAG-OX Take 1 tablet (400 mg total) by mouth 2 (two) times daily.   potassium chloride SA 20 MEQ tablet Commonly known as:  K-DUR,KLOR-CON Take 2 tablets (40 mEq total) by mouth 2 (two) times daily.   ranitidine 150 MG capsule Commonly known as:  ZANTAC Take 150 mg by mouth daily as needed for heartburn.   rivaroxaban 20 MG Tabs tablet Commonly known as:  XARELTO Take 1 tablet (20 mg total) by mouth daily with supper.     triamterene-hydrochlorothiazide 37.5-25 MG capsule Commonly known as:  DYAZIDE Take 1 capsule by mouth daily.   [name] 40 MG tablet Generic drug:  febuxostat Take 20 mg by mouth daily as needed (gout flare up).       Outstanding Labs/Studies   None   Duration of Discharge Encounter   Greater than 30 minutes including physician time.  Signed, Curt Bears  Brooks Sailors DNP, ANP, AACC   07/29/2017, 1:50 PM

## 2017-07-29 NOTE — Progress Notes (Signed)
Discharge instructions reviewed with Pt. Pt has no questions at this time. IV d/c. Pt states she is ready to go home.

## 2017-07-29 NOTE — Care Management Note (Signed)
Case Management Note  Patient Details  Name: [name] [name] MRN: [id number] Date of Birth: Dec 09, 1945  Subjective/Objective:           Pt presented for AF with RVR         Action/Plan: Pt started on Xarelto.  Unable to get benefits check on the weekend.  Pt given 30 day free Xarelto card and copay card.  Pt instructed to find out copay and discuss with cardiologist if unable to afford after copay card applied.   Expected Discharge Date:  07/29/17               Expected Discharge Plan:  Home/Self Care  In-House Referral:  NA  Discharge planning Services  CM Consult, Medication Assistance  Post Acute Care Choice:    Choice offered to:  NA  DME Arranged:    DME Agency:     HH Arranged:    HH Agency:     Status of Service:     If discussed at [employer], dates discussed:    Additional Comments:  Arley Phenix, RN 07/29/2017, 3:19 PM

## 2017-07-29 NOTE — Progress Notes (Signed)
Pt's co pay for  Xarelto is $45.00  Per case management. Pt made aware.

## 2017-07-31 ENCOUNTER — Other Ambulatory Visit: Payer: Self-pay

## 2017-07-31 ENCOUNTER — Telehealth: Payer: Self-pay | Admitting: Student

## 2017-07-31 DIAGNOSIS — I4891 Unspecified atrial fibrillation: Secondary | ICD-10-CM

## 2017-07-31 NOTE — Telephone Encounter (Signed)
New message   Patient calling , states she does not know what medication she needs take since she has ben discharged from the hospital. Please call

## 2017-07-31 NOTE — Consult Note (Signed)
Patient had been hospitalized 07/26/17 with chronic atrial fibrillation.  Call attempt today to reach out to patient in the [employer] plan. Was able to leave a message on [phone] (No voicemail answering machine on [phone] noted) HIPAA approved voicemail message requesting a return call was been out reached by [place] prior to admission.  Note reviewed and patient on new medication. Will assign for Telephonic Nurse calls.  For questions, please contact:  Natividad Brood, RN BSN [facility] Liaison  [phone] business mobile phone Toll free office [phone]

## 2017-07-31 NOTE — Telephone Encounter (Signed)
Returned call to patient she stated she is taking too many medications.Advised to keep appointment with Roderic Palau PA 08/03/17 at 10:30 am to discuss.Advised to bring all medications to appointment.Directions given.Advised to call afib clinic at # [phone] to get code # to get into parking garage.

## 2017-08-01 ENCOUNTER — Other Ambulatory Visit: Payer: Self-pay | Admitting: *Deleted

## 2017-08-01 ENCOUNTER — Encounter: Payer: Self-pay | Admitting: *Deleted

## 2017-08-01 NOTE — Patient Outreach (Signed)
[facility]) Care Management  08/01/2017  NAKIRA LITZAU Jul 20, 1946 [id number]    Transition of care (discharged 12/15)  RN attempted the initial transition of care outreach however unsuccessful. Will reschedule another outreach call for tomorrow.  [name], RN Care Management Coordinator [place] Office [phone]

## 2017-08-01 NOTE — Progress Notes (Signed)
Late Entry Evaluation Note   Pt admitted with/for SOB and found to be in Afib with RVR.  Pt currently limited functionally due to the problems listed below.  (see problems list.)  Pt will benefit from PT to maximize function and safety to be able to get home safely with available assist     07/28/17 1608  PT Visit Information  Last PT Received On 07/28/17  Assistance Needed +1  History of Present Illness pt is a 71 y/o female with pmh significant for CAD, s/p CABG, parox afib, HTN, ICM, presenting to the ED with c/o progressive SOB with intermittent sense of palpitations.  Found to have afib with RVR.  Precautions  Precautions Fall  Home Living  Family/patient expects to be discharged to: Private residence  Living Arrangements Non-relatives/Friends;Other (Comment) (room mate)  Available Help at Discharge Available PRN/intermittently;Family  Type of [place] to enter  Entrance Stairs-Number of Steps several  Entrance Stairs-Rails Right;Left  Home Layout One level  [place] None  Prior Function  Level of Independence Independent  Communication  Communication No difficulties  Pain Assessment  Pain Assessment No/denies pain  Cognition  Arousal/Alertness Awake/alert  Behavior During Therapy WFL for tasks assessed/performed  Overall Cognitive Status Within Functional Limits for tasks assessed  Lower Extremity Assessment  Lower Extremity Assessment Generalized weakness (proximal hip flexor weakness)  Bed Mobility  General bed mobility comments sitting EOB on arrival  Transfers  Overall transfer level Needs assistance  Transfers Sit to/from Stand  Sit to Stand Modified independent (Device/Increase time)  Ambulation/Gait  Ambulation/Gait assistance Supervision  Ambulation Distance (Feet) 130 Feet  Assistive device 1 person hand held assist  Gait Pattern/deviations Step-through pattern  General Gait Details generally steady but quick to fatigue.  Pt  became moderately dyspneic, but by the time the pulse ox registered the sats, they shwed 95%  Gait velocity slower  Gait velocity interpretation Below normal speed for age/gender  Balance  Overall balance assessment Needs assistance  Sitting balance-Leahy Scale Normal  Standing balance-Leahy Scale Fair  PT - End of Session  Activity Tolerance Patient tolerated treatment well  Patient left in bed;with call bell/phone within reach;Other (comment) (sitting EOB)  Nurse Communication Mobility status  PT Assessment  PT Recommendation/Assessment Patient needs continued PT services  PT Visit Diagnosis Unsteadiness on feet (R26.81);Difficulty in walking, not elsewhere classified (R26.2)  PT Problem List Decreased strength;Decreased activity tolerance;Decreased mobility;Cardiopulmonary status limiting activity  Barriers to Discharge Decreased caregiver support  Barriers to Discharge Comments pt has room mate, but there is little interaction betwwn them.  PT Plan  PT Frequency (ACUTE ONLY) Min 3X/week  PT Treatment/Interventions (ACUTE ONLY) Gait training;Functional mobility training;Therapeutic activities;Stair training;Patient/family education  AM-PAC PT "6 Clicks" Daily Activity Outcome Measure  Difficulty turning over in bed (including adjusting bedclothes, sheets and blankets)? 4  Difficulty moving from lying on back to sitting on the side of the bed?  4  Difficulty sitting down on and standing up from a chair with arms (e.g., wheelchair, bedside commode, etc,.)? 4  Help needed moving to and from a bed to chair (including a wheelchair)? 4  Help needed walking in hospital room? 3  Help needed climbing 3-5 steps with a railing?  3  6 Click Score 22  Mobility G Code  CJ  Individuals Consulted  Consulted and Agree with Results and Recommendations Patient  Acute Rehab PT Goals  Patient Stated Goal home independent  PT Goal Formulation With patient  Time  For Goal Achievement 08/11/17   Potential to Achieve Goals Good  PT Time Calculation  PT Start Time (ACUTE ONLY) 1525  PT Stop Time (ACUTE ONLY) 1548  PT Time Calculation (min) (ACUTE ONLY) 23 min  PT General Charges  $$ ACUTE PT VISIT 1 Visit  PT Evaluation  $PT Eval Low Complexity 1 Low  PT Treatments  $Gait Training 8-22 mins   08/01/2017  Donnella Sham, PT [phone]  (pager)

## 2017-08-02 ENCOUNTER — Ambulatory Visit (HOSPITAL_BASED_OUTPATIENT_CLINIC_OR_DEPARTMENT_OTHER)
Admission: RE | Admit: 2017-08-02 | Discharge: 2017-08-02 | Disposition: A | Discharge status: Home / Self Care | Payer: PPO | Source: Ambulatory Visit | Attending: Nurse Practitioner | Admitting: Nurse Practitioner

## 2017-08-02 ENCOUNTER — Encounter: Payer: Self-pay | Admitting: *Deleted

## 2017-08-02 ENCOUNTER — Encounter (HOSPITAL_COMMUNITY): Payer: Self-pay | Admitting: Nurse Practitioner

## 2017-08-02 ENCOUNTER — Other Ambulatory Visit: Payer: Self-pay | Admitting: *Deleted

## 2017-08-02 VITALS — BP 124/56 | HR 129 | Ht 67.0 in | Wt 220.0 lb

## 2017-08-02 DIAGNOSIS — K649 Unspecified hemorrhoids: Secondary | ICD-10-CM | POA: Diagnosis present

## 2017-08-02 DIAGNOSIS — I5042 Chronic combined systolic (congestive) and diastolic (congestive) heart failure: Secondary | ICD-10-CM

## 2017-08-02 DIAGNOSIS — I2510 Atherosclerotic heart disease of native coronary artery without angina pectoris: Secondary | ICD-10-CM

## 2017-08-02 DIAGNOSIS — I10 Essential (primary) hypertension: Secondary | ICD-10-CM

## 2017-08-02 DIAGNOSIS — M1990 Unspecified osteoarthritis, unspecified site: Secondary | ICD-10-CM

## 2017-08-02 DIAGNOSIS — R0602 Shortness of breath: Secondary | ICD-10-CM | POA: Diagnosis not present

## 2017-08-02 DIAGNOSIS — I110 Hypertensive heart disease with heart failure: Secondary | ICD-10-CM

## 2017-08-02 DIAGNOSIS — I4891 Unspecified atrial fibrillation: Secondary | ICD-10-CM | POA: Diagnosis not present

## 2017-08-02 DIAGNOSIS — J45909 Unspecified asthma, uncomplicated: Secondary | ICD-10-CM

## 2017-08-02 DIAGNOSIS — Z7901 Long term (current) use of anticoagulants: Secondary | ICD-10-CM | POA: Insufficient documentation

## 2017-08-02 DIAGNOSIS — F17210 Nicotine dependence, cigarettes, uncomplicated: Secondary | ICD-10-CM | POA: Insufficient documentation

## 2017-08-02 DIAGNOSIS — I4819 Other persistent atrial fibrillation: Secondary | ICD-10-CM

## 2017-08-02 DIAGNOSIS — I255 Ischemic cardiomyopathy: Secondary | ICD-10-CM | POA: Insufficient documentation

## 2017-08-02 DIAGNOSIS — I481 Persistent atrial fibrillation: Secondary | ICD-10-CM | POA: Diagnosis not present

## 2017-08-02 DIAGNOSIS — E785 Hyperlipidemia, unspecified: Secondary | ICD-10-CM | POA: Diagnosis present

## 2017-08-02 DIAGNOSIS — Z951 Presence of aortocoronary bypass graft: Secondary | ICD-10-CM

## 2017-08-02 DIAGNOSIS — E876 Hypokalemia: Secondary | ICD-10-CM | POA: Diagnosis not present

## 2017-08-02 DIAGNOSIS — Z79899 Other long term (current) drug therapy: Secondary | ICD-10-CM | POA: Insufficient documentation

## 2017-08-02 DIAGNOSIS — I080 Rheumatic disorders of both mitral and aortic valves: Secondary | ICD-10-CM | POA: Insufficient documentation

## 2017-08-02 DIAGNOSIS — Z885 Allergy status to narcotic agent status: Secondary | ICD-10-CM | POA: Diagnosis not present

## 2017-08-02 DIAGNOSIS — N183 Chronic kidney disease, stage 3 (moderate): Secondary | ICD-10-CM | POA: Diagnosis not present

## 2017-08-02 DIAGNOSIS — I480 Paroxysmal atrial fibrillation: Secondary | ICD-10-CM | POA: Diagnosis present

## 2017-08-02 DIAGNOSIS — K922 Gastrointestinal hemorrhage, unspecified: Secondary | ICD-10-CM | POA: Diagnosis not present

## 2017-08-02 DIAGNOSIS — I5033 Acute on chronic diastolic (congestive) heart failure: Secondary | ICD-10-CM | POA: Diagnosis not present

## 2017-08-02 DIAGNOSIS — I5043 Acute on chronic combined systolic (congestive) and diastolic (congestive) heart failure: Secondary | ICD-10-CM | POA: Diagnosis not present

## 2017-08-02 DIAGNOSIS — I130 Hypertensive heart and chronic kidney disease with heart failure and stage 1 through stage 4 chronic kidney disease, or unspecified chronic kidney disease: Secondary | ICD-10-CM | POA: Diagnosis not present

## 2017-08-02 MED ORDER — CARVEDILOL 25 MG PO TABS: 25.0000 mg | ORAL_TABLET | Freq: Two times a day (BID) | ORAL | 3 refills | Status: DC

## 2017-08-02 NOTE — Patient Instructions (Signed)
Your physician has recommended you make the following change in your medication: 1) INCREASE Coreg 25mg  twice a day 2)INCREASE lasix to 40mg  twice a day

## 2017-08-02 NOTE — Patient Outreach (Addendum)
[facility]) Care Management  08/02/2017  [name] [name] 1946-07-03 [id number]  Transition of care  RN spoke with pt today and inquired if pt had time to speak. RN explained the [facility] services and purpose for today's call . Pt indicated she had atrial fibrillation and RN indicated this is one of the reason's why [facility] was calling. RN further explained the purpose for today's call related to her atrial fibrillation, transition of care and offered to extend services with a home visit to assist her further with this medical condition. Explained [facility] services and services [employer] pts with there medical condition as pt states she is managing her condition with the adjustments in her medications as well as she can and declined all services with [facility] for visits and/or telephonic disease management. Offered to review medication (decline) and the call was ended. RN will sent letter and notify pt's provider prior to closing this case.  [name], RN Care Management Coordinator [place] [facility] [place] [facility] [phone]

## 2017-08-03 ENCOUNTER — Encounter (HOSPITAL_COMMUNITY): Payer: PPO | Admitting: Nurse Practitioner

## 2017-08-03 NOTE — Progress Notes (Signed)
Primary Care Physician: [name], MD Referring Physician: [facility] F/U Cardiologist: Dr. Rosalie Gums [name] [name] is a 71 y.o. female with a h/o hypertension with multiple medication adjustments,paroxsymal atrial fibrillation, CHADS VASC Score 5, status post left atrial appendage clipping, coronary artery disease status post CABG, hyperlipidemia, ischemic cardiomyopathy, with normal LV systolic function per echo May 2018.  The patient presented to the hospital, 12/12, with complaints of increasing dyspnea and symptomatic chronic combined heart failure, with hypertension.  She was found to be in rapid atrial fibrillation, placed on IV diltiazem drip.  Review of EKGs from May show that she was having persistent atrial fibrillation.  Carvedilol was increased to 18.75 mg twice daily.  On 07/27/2017 she was seen by electrophysiologist Dr. Rayann Heman.  At that point the patient's rates have improved on diltiazem, and the patient was converted to p.o. diltiazem 240 mg daily.  It was their recommendation that she begin on Xarelto 20 mg daily secondary to CHADS VASC Score of 5.  She was to have a follow-up visit in the A. fib clinic to evaluate for Tikosyn admission.  Was also recommended that the patient be placed on the sleep study scheduled to evaluate for obstructive sleep apnea.  On day of discharge the patient was seen and examined by Dr. Peter Martinique.  Blood pressure was mildly elevated on evaluation, but no medication changes were made at that time.  The patient was anxious.  There is an issue with compliance which was worrisome.  Atrial fibrillation were between 101 115 bpm on review of telemetry.  She also had some mild dyspnea.  In the afib clinc, 12/19, for f/u. The pt is mildly dyspneic at rest but states that she smoked until one month agao and usually has some shortness of breath. Her weight is up 5 lbs and she is in Afib with RVR at 129 bpm. She has not taken her meds thia am as she did not a  chance to eat breakfast. Her cousin is with her and has concerns that the pt is not taking her meds on a consistent manner.  She just started xarelto 12/13 and would not be eligible for tikosyn or cardioversion until she has been on anticoagulation x 3 weeks. She states that she is taking this drug as prescribed.   Today, she denies symptoms of palpitations, chest pain,  orthopnea, PND, lower extremity edema, dizziness, presyncope, syncope, or neurologic sequela. + for shortness of breath and fatigue. The patient is tolerating medications without difficulties and is otherwise without complaint today.   Past Medical History:  Diagnosis Date  . Arthritis   . Asthma   . Cardiomyopathy, ischemic    LVEF 50-55% as of May 2018  . Chronic combined systolic and diastolic heart failure ([place])   . Hypertension   . Left main coronary artery disease 11/04/2014  . PAF (paroxysmal atrial fibrillation) ([place])   . S/P CABG x 2 with clipping of LA appendage 11/14/2014   LIMA to LAD, SVG to OM, EVH via right thigh   Past Surgical History:  Procedure Laterality Date  . CARDIOVERSION N/A 11/18/2014   Procedure: CARDIOVERSION;  Surgeon: [name], MD;  Location: [facility];  Service: Cardiovascular;  Laterality: N/A;  . CLIPPING OF ATRIAL APPENDAGE N/A 11/14/2014   Procedure: CLIPPING OF ATRIAL APPENDAGE;  Surgeon: [name], MD;  Location: [facility];  Service: Open Heart Surgery;  Laterality: N/A;  . CORONARY ARTERY BYPASS GRAFT N/A 11/14/2014   Procedure:  CORONARY ARTERY BYPASS GRAFTING (CABG)TIMES 2 USING LEFT INTERNAL MAMMARY ARTERY AND RIGHT SAPHENOUS VEIN HARVESTED ENDOSCOPICALLY;  Surgeon: [name], MD;  Location: [place] [facility];  Service: Open Heart Surgery;  Laterality: N/A;  . LEFT HEART CATHETERIZATION WITH CORONARY ANGIOGRAM N/A 11/04/2014   Procedure: LEFT HEART CATHETERIZATION WITH CORONARY ANGIOGRAM;  Surgeon: Troy Sine, MD;  Location: [facility] CATH LAB;  Service: Cardiovascular;  Laterality: N/A;  . TEE  WITHOUT CARDIOVERSION N/A 11/14/2014   Procedure: TRANSESOPHAGEAL ECHOCARDIOGRAM (TEE);  Surgeon: [name], MD;  Location: [facility];  Service: Open Heart Surgery;  Laterality: N/A;  . TEMPORARY PACEMAKER INSERTION  11/04/2014   Procedure: TEMPORARY PACEMAKER INSERTION;  Surgeon: Troy Sine, MD;  Location[facility] C[facility] LAB;  Service: Cardiovascular;;    Current Outpatient Medications  Medication Sig Dispense Refill  . acetaminophen (TYLENOL) 500 MG tablet Take 500 mg by mouth every 6 (six) hours as needed for moderate pain.     Marland Kitchen atorvastatin (LIPITOR) 10 MG tablet TAKE 1 TABLET EVERY DAY  AT  6PM (Patient taking differently: TAKE 10 mg TABLET EVERY DAY) 90 tablet 3  . carvedilol (COREG) 25 MG tablet Take 1 tablet (25 mg total) by mouth 2 (two) times daily with a meal. 60 tablet 3  . diltiazem (CARDIZEM CD) 240 MG 24 hr capsule Take 1 capsule (240 mg total) by mouth daily. 30 capsule 4  . febuxostat ([name]) 40 MG tablet Take 40 mg by mouth daily.    . fluticasone (FLONASE) 50 MCG/ACT nasal spray Place 1 spray into both nostrils daily. 16 g 2  . furosemide (LASIX) 40 MG tablet Take 1 tablet (40 mg total) by mouth daily. 30 tablet 6  . magnesium oxide (MAG-OX) 400 (241.3 Mg) MG tablet Take 1 tablet (400 mg total) by mouth 2 (two) times daily. 60 tablet 3  . potassium chloride SA (K-DUR,KLOR-CON) 20 MEQ tablet Take 2 tablets (40 mEq total) by mouth 2 (two) times daily. 60 tablet 4  . ranitidine (ZANTAC) 150 MG capsule Take 150 mg by mouth daily as needed for heartburn.     . rivaroxaban (XARELTO) 20 MG TABS tablet Take 1 tablet (20 mg total) by mouth daily with supper. 30 tablet 4   No current facility-administered medications for this encounter.     Allergies  Allergen Reactions  . Bee Venom Anaphylaxis  . Codeine Nausea And Vomiting  . Shrimp [Shellfish Allergy] Swelling  . Tomato     Pt states it gives her gout    Social History   Socioeconomic History  . Marital status: Widowed     Spouse name: Not on file  . Number of children: Not on file  . Years of education: Not on file  . Highest education level: Not on file  Social Needs  . Financial resource strain: Not on file  . Food insecurity - worry: Not on file  . Food insecurity - inability: Not on file  . Transportation needs - medical: Not on file  . Transportation needs - non-medical: Not on file  Occupational History  . Not on file  Tobacco Use  . Smoking status: Current Some Day Smoker    Packs/day: 1.00    Years: 32.00    Pack years: 32.00    Types: Cigarettes  . Smokeless tobacco: Never Used  . Tobacco comment: occasional puff here and there - 02/09/15  Substance and Sexual Activity  . Alcohol use: No    Alcohol/week: 0.0 oz  . Drug  use: No  . Sexual activity: Not on file  Other Topics Concern  . Not on file  Social History Narrative  . Not on file    Family History  Problem Relation Age of Onset  . Cancer Mother   . Heart disease Father     ROS- All systems are reviewed and negative except as per the HPI above  Physical Exam: Vitals:   08/02/17 1025  BP: (!) 124/56  Pulse: (!) 129  SpO2: 97%  Weight: 220 lb (99.8 kg)  Height: 5\' 7"  (1.702 m)   Wt Readings from Last 3 Encounters:  08/02/17 220 lb (99.8 kg)  07/28/17 215 lb 4.8 oz (97.7 kg)  07/22/17 210 lb (95.3 kg)    Labs: Lab Results  Component Value Date   NA 142 07/29/2017   K 3.5 07/29/2017   CL 107 07/29/2017   CO2 24 07/29/2017   GLUCOSE 132 (H) 07/29/2017   BUN 24 (H) 07/29/2017   CREATININE 1.25 (H) 07/29/2017   CALCIUM 8.9 07/29/2017   PHOS 3.0 11/09/2014   MG 1.7 07/29/2017   Lab Results  Component Value Date   INR 1.24 07/26/2017   Lab Results  Component Value Date   CHOL 116 11/12/2014   HDL 34 (L) 11/12/2014   LDLCALC 60 11/12/2014   TRIG 111 11/12/2014     GEN- The patient is well appearing, alert and oriented x 3 today.   Head- normocephalic, atraumatic Eyes-  Sclera clear, conjunctiva  pink Ears- hearing intact Oropharynx- clear Neck- supple, no JVP Lymph- no cervical lymphadenopathy Lungs- Deminished ausculation bilaterally, normal work of breathing Heart- Rapid irregular rate and rhythm, no murmurs, rubs or gallops, PMI not laterally displaced GI- soft, NT, ND, + BS Extremities- no clubbing, cyanosis, 1+ lower extremity edema MS- no significant deformity or atrophy Skin- no rash or lesion Psych- euthymic mood, full affect Neuro- strength and sensation are intact  EKG-afib with rvr at 129 bpm with qtc at 512 ms Echo-Study Conclusions  - Left ventricle: Posterior lateral hypokinesis. The cavity size   was normal. Systolic function was normal. The estimated ejection   fraction was in the range of 50% to 55%. Left ventricular   diastolic function parameters were normal. - Aortic valve: There was mild regurgitation. - Mitral valve: There was mild regurgitation. - Left atrium: The atrium was moderately dilated. - Atrial septum: No defect or patent foramen ovale was identified.    Assessment and Plan: 1. Persistent afib with RVR I am concerned re compliance with her cousin's input with concern for same Stressed need for compliance, especially with xarelto Chadsvasc score is 5 Increase carvedilol to 25 mg bid Will need to wait 3 weeks for full anticoagulation to consider pt for tikosyn/cardioversion unless clinical deterioration warrants TEE Qtc today is over 500 ms and this may contraindicate tikosyn use if qtc does not shorten, I have seen qtc in SR at acceptable lengths Increase carvedilol to 25 mg bid  2. CHF Weight is up 5 lbs Increase lasix to 40 mg bid Avoid salt  3. HTN Stable  Will see back on Friday for  review of med changes  Butch Penny C. Surya Folden, [facility] [address] [place], [place] [address] [phone]

## 2017-08-04 ENCOUNTER — Other Ambulatory Visit: Payer: Self-pay

## 2017-08-04 ENCOUNTER — Encounter (HOSPITAL_COMMUNITY): Payer: Self-pay | Admitting: Nurse Practitioner

## 2017-08-04 ENCOUNTER — Ambulatory Visit (HOSPITAL_BASED_OUTPATIENT_CLINIC_OR_DEPARTMENT_OTHER)
Admission: RE | Admit: 2017-08-04 | Discharge: 2017-08-04 | Disposition: A | Discharge status: Home / Self Care | Payer: PPO | Source: Ambulatory Visit | Attending: Nurse Practitioner | Admitting: Nurse Practitioner

## 2017-08-04 ENCOUNTER — Inpatient Hospital Stay (HOSPITAL_COMMUNITY)
Admission: EM | Admit: 2017-08-04 | Discharge: 2017-08-06 | DRG: 291 | Disposition: A | Discharge status: Home / Self Care | Payer: PPO | Attending: Cardiology | Admitting: Cardiology

## 2017-08-04 ENCOUNTER — Emergency Department (HOSPITAL_COMMUNITY): Payer: PPO

## 2017-08-04 ENCOUNTER — Encounter (HOSPITAL_COMMUNITY): Payer: Self-pay | Admitting: Emergency Medicine

## 2017-08-04 VITALS — BP 116/70 | HR 141 | Ht 67.0 in | Wt 225.0 lb

## 2017-08-04 DIAGNOSIS — I5042 Chronic combined systolic (congestive) and diastolic (congestive) heart failure: Secondary | ICD-10-CM | POA: Insufficient documentation

## 2017-08-04 DIAGNOSIS — I2510 Atherosclerotic heart disease of native coronary artery without angina pectoris: Secondary | ICD-10-CM

## 2017-08-04 DIAGNOSIS — J45909 Unspecified asthma, uncomplicated: Secondary | ICD-10-CM | POA: Diagnosis present

## 2017-08-04 DIAGNOSIS — Z9119 Patient's noncompliance with other medical treatment and regimen: Secondary | ICD-10-CM | POA: Insufficient documentation

## 2017-08-04 DIAGNOSIS — N183 Chronic kidney disease, stage 3 unspecified: Secondary | ICD-10-CM | POA: Diagnosis present

## 2017-08-04 DIAGNOSIS — K922 Gastrointestinal hemorrhage, unspecified: Secondary | ICD-10-CM | POA: Diagnosis present

## 2017-08-04 DIAGNOSIS — Z950 Presence of cardiac pacemaker: Secondary | ICD-10-CM | POA: Insufficient documentation

## 2017-08-04 DIAGNOSIS — Z7901 Long term (current) use of anticoagulants: Secondary | ICD-10-CM | POA: Insufficient documentation

## 2017-08-04 DIAGNOSIS — Z91013 Allergy to seafood: Secondary | ICD-10-CM | POA: Insufficient documentation

## 2017-08-04 DIAGNOSIS — Z951 Presence of aortocoronary bypass graft: Secondary | ICD-10-CM | POA: Insufficient documentation

## 2017-08-04 DIAGNOSIS — E785 Hyperlipidemia, unspecified: Secondary | ICD-10-CM | POA: Diagnosis present

## 2017-08-04 DIAGNOSIS — Z79899 Other long term (current) drug therapy: Secondary | ICD-10-CM

## 2017-08-04 DIAGNOSIS — Z91030 Bee allergy status: Secondary | ICD-10-CM

## 2017-08-04 DIAGNOSIS — F17210 Nicotine dependence, cigarettes, uncomplicated: Secondary | ICD-10-CM

## 2017-08-04 DIAGNOSIS — K649 Unspecified hemorrhoids: Secondary | ICD-10-CM | POA: Diagnosis present

## 2017-08-04 DIAGNOSIS — Z91018 Allergy to other foods: Secondary | ICD-10-CM | POA: Insufficient documentation

## 2017-08-04 DIAGNOSIS — I481 Persistent atrial fibrillation: Secondary | ICD-10-CM

## 2017-08-04 DIAGNOSIS — I4819 Other persistent atrial fibrillation: Secondary | ICD-10-CM | POA: Diagnosis present

## 2017-08-04 DIAGNOSIS — Z9889 Other specified postprocedural states: Secondary | ICD-10-CM

## 2017-08-04 DIAGNOSIS — Z8249 Family history of ischemic heart disease and other diseases of the circulatory system: Secondary | ICD-10-CM

## 2017-08-04 DIAGNOSIS — Z885 Allergy status to narcotic agent status: Secondary | ICD-10-CM | POA: Insufficient documentation

## 2017-08-04 DIAGNOSIS — I5043 Acute on chronic combined systolic (congestive) and diastolic (congestive) heart failure: Secondary | ICD-10-CM | POA: Diagnosis present

## 2017-08-04 DIAGNOSIS — Z809 Family history of malignant neoplasm, unspecified: Secondary | ICD-10-CM

## 2017-08-04 DIAGNOSIS — I110 Hypertensive heart disease with heart failure: Secondary | ICD-10-CM

## 2017-08-04 DIAGNOSIS — I4891 Unspecified atrial fibrillation: Secondary | ICD-10-CM

## 2017-08-04 DIAGNOSIS — I480 Paroxysmal atrial fibrillation: Secondary | ICD-10-CM | POA: Insufficient documentation

## 2017-08-04 DIAGNOSIS — I255 Ischemic cardiomyopathy: Secondary | ICD-10-CM

## 2017-08-04 DIAGNOSIS — I5031 Acute diastolic (congestive) heart failure: Secondary | ICD-10-CM | POA: Diagnosis present

## 2017-08-04 DIAGNOSIS — E876 Hypokalemia: Secondary | ICD-10-CM | POA: Diagnosis present

## 2017-08-04 DIAGNOSIS — I130 Hypertensive heart and chronic kidney disease with heart failure and stage 1 through stage 4 chronic kidney disease, or unspecified chronic kidney disease: Principal | ICD-10-CM | POA: Diagnosis present

## 2017-08-04 HISTORY — DX: Gastrointestinal hemorrhage, unspecified: K92.2

## 2017-08-04 LAB — BASIC METABOLIC PANEL
Anion gap: 10 (ref 5–15)
BUN: 27 mg/dL — ABNORMAL HIGH (ref 6–20)
CO2: 19 mmol/L — ABNORMAL LOW (ref 22–32)
Calcium: 8.8 mg/dL — ABNORMAL LOW (ref 8.9–10.3)
Chloride: 110 mmol/L (ref 101–111)
Creatinine, Ser: 1.38 mg/dL — ABNORMAL HIGH (ref 0.44–1.00)
GFR calc Af Amer: 43 mL/min — ABNORMAL LOW (ref >60)
GFR calc non Af Amer: 37 mL/min — ABNORMAL LOW (ref >60)
Glucose, Bld: 133 mg/dL — ABNORMAL HIGH (ref 65–99)
Potassium: 3.4 mmol/L — ABNORMAL LOW (ref 3.5–5.1)
Sodium: 139 mmol/L (ref 135–145)

## 2017-08-04 LAB — CBC WITH DIFFERENTIAL/PLATELET
Basophils Absolute: 0 10*3/uL (ref 0.0–0.1)
Basophils Relative: 0 %
Eosinophils Absolute: 0.1 10*3/uL (ref 0.0–0.7)
Eosinophils Relative: 1 %
HCT: 42.3 % (ref 36.0–46.0)
Hemoglobin: 13.7 g/dL (ref 12.0–15.0)
Lymphocytes Relative: 26 %
Lymphs Abs: 2.1 10*3/uL (ref 0.7–4.0)
MCH: 29.1 pg (ref 26.0–34.0)
MCHC: 32.4 g/dL (ref 30.0–36.0)
MCV: 89.8 fL (ref 78.0–100.0)
Monocytes Absolute: 0.5 10*3/uL (ref 0.1–1.0)
Monocytes Relative: 6 %
Neutro Abs: 5.4 10*3/uL (ref 1.7–7.7)
Neutrophils Relative %: 67 %
Platelets: 132 10*3/uL — ABNORMAL LOW (ref 150–400)
RBC: 4.71 MIL/uL (ref 3.87–5.11)
RDW: 16.9 % — ABNORMAL HIGH (ref 11.5–15.5)
WBC: 8.1 10*3/uL (ref 4.0–10.5)

## 2017-08-04 LAB — POC OCCULT BLOOD, ED: Fecal Occult Bld: NEGATIVE

## 2017-08-04 LAB — BRAIN NATRIURETIC PEPTIDE: B Natriuretic Peptide: 697.5 pg/mL — ABNORMAL HIGH (ref 0.0–100.0)

## 2017-08-04 LAB — I-STAT TROPONIN, ED: Troponin i, poc: 0 ng/mL (ref 0.00–0.08)

## 2017-08-04 MED ORDER — NITROGLYCERIN 0.4 MG SL SUBL: 0.4000 mg | SUBLINGUAL_TABLET | SUBLINGUAL | Status: DC | PRN

## 2017-08-04 MED ORDER — FLUTICASONE PROPIONATE 50 MCG/ACT NA SUSP
1.0000 | Freq: Every day | NASAL | Status: DC | PRN
  Filled 2017-08-04: qty 16

## 2017-08-04 MED ORDER — DILTIAZEM HCL-DEXTROSE 100-5 MG/100ML-% IV SOLN (PREMIX)
5.0000 mg/h | Freq: Once | INTRAVENOUS | Status: AC
  Administered 2017-08-04: 5 mg/h via INTRAVENOUS
  Filled 2017-08-04: qty 100

## 2017-08-04 MED ORDER — DILTIAZEM HCL ER COATED BEADS 180 MG PO CP24
360.0000 mg | ORAL_CAPSULE | Freq: Every day | ORAL | Status: DC
  Administered 2017-08-05 – 2017-08-06 (×2): 360 mg via ORAL
  Filled 2017-08-04 (×2): qty 2

## 2017-08-04 MED ORDER — FEBUXOSTAT 40 MG PO TABS
40.0000 mg | ORAL_TABLET | Freq: Every day | ORAL | Status: DC
  Administered 2017-08-05 – 2017-08-06 (×2): 40 mg via ORAL
  Filled 2017-08-04 (×3): qty 1

## 2017-08-04 MED ORDER — CARVEDILOL 25 MG PO TABS
25.0000 mg | ORAL_TABLET | Freq: Two times a day (BID) | ORAL | Status: DC
  Administered 2017-08-05 – 2017-08-06 (×4): 25 mg via ORAL
  Filled 2017-08-04 (×4): qty 1

## 2017-08-04 MED ORDER — FUROSEMIDE 40 MG PO TABS
40.0000 mg | ORAL_TABLET | Freq: Every day | ORAL | Status: DC
  Administered 2017-08-05: 40 mg via ORAL
  Filled 2017-08-04: qty 1

## 2017-08-04 MED ORDER — ONDANSETRON HCL 4 MG/2ML IJ SOLN: 4.0000 mg | Freq: Four times a day (QID) | INTRAMUSCULAR | Status: DC | PRN

## 2017-08-04 MED ORDER — FAMOTIDINE 20 MG PO TABS
20.0000 mg | ORAL_TABLET | Freq: Every day | ORAL | Status: DC
  Administered 2017-08-05 – 2017-08-06 (×2): 20 mg via ORAL
  Filled 2017-08-04 (×3): qty 1

## 2017-08-04 MED ORDER — MAGNESIUM OXIDE 400 (241.3 MG) MG PO TABS
400.0000 mg | ORAL_TABLET | Freq: Two times a day (BID) | ORAL | Status: DC
  Administered 2017-08-04 – 2017-08-06 (×4): 400 mg via ORAL
  Filled 2017-08-04 (×4): qty 1

## 2017-08-04 MED ORDER — POTASSIUM CHLORIDE CRYS ER 20 MEQ PO TBCR
40.0000 meq | EXTENDED_RELEASE_TABLET | Freq: Once | ORAL | Status: AC
  Administered 2017-08-04: 40 meq via ORAL
  Filled 2017-08-04: qty 2

## 2017-08-04 MED ORDER — ATORVASTATIN CALCIUM 10 MG PO TABS
10.0000 mg | ORAL_TABLET | Freq: Every day | ORAL | Status: DC
  Administered 2017-08-05 – 2017-08-06 (×2): 10 mg via ORAL
  Filled 2017-08-04 (×2): qty 1

## 2017-08-04 MED ORDER — POTASSIUM CHLORIDE CRYS ER 20 MEQ PO TBCR
40.0000 meq | EXTENDED_RELEASE_TABLET | Freq: Two times a day (BID) | ORAL | Status: DC
  Administered 2017-08-04 – 2017-08-06 (×4): 40 meq via ORAL
  Filled 2017-08-04 (×4): qty 2

## 2017-08-04 MED ORDER — ACETAMINOPHEN 325 MG PO TABS
650.0000 mg | ORAL_TABLET | ORAL | Status: DC | PRN
  Administered 2017-08-04 – 2017-08-05 (×2): 650 mg via ORAL
  Filled 2017-08-04 (×2): qty 2

## 2017-08-04 MED ORDER — FUROSEMIDE 10 MG/ML IJ SOLN
40.0000 mg | Freq: Once | INTRAMUSCULAR | Status: AC
  Administered 2017-08-04: 40 mg via INTRAVENOUS
  Filled 2017-08-04: qty 4

## 2017-08-04 MED ORDER — DILTIAZEM HCL 60 MG PO TABS
60.0000 mg | ORAL_TABLET | Freq: Four times a day (QID) | ORAL | Status: AC
  Administered 2017-08-04 (×2): 60 mg via ORAL
  Filled 2017-08-04 (×2): qty 1

## 2017-08-04 MED ORDER — RIVAROXABAN 20 MG PO TABS
20.0000 mg | ORAL_TABLET | Freq: Every day | ORAL | Status: DC
  Administered 2017-08-04 – 2017-08-06 (×3): 20 mg via ORAL
  Filled 2017-08-04 (×3): qty 1

## 2017-08-04 NOTE — H&P (Signed)
Primary Care Physician: [name] Low, MD Referring Physician: [facility] F/U Cardiologist: Dr. Rosalie [name] [name] [name] is a 71 y.o. female with a h/o hypertension with multiple medication adjustments,paroxsymal atrial fibrillation, CHADS VASC Score 5, status post left atrial appendage clipping, coronary artery disease status post CABG, hyperlipidemia, ischemic cardiomyopathy, with normal LV systolic function per echo May 2018.  The patient presented to the hospital, 12/12, with complaints of increasing dyspnea and symptomatic chronic combined heart failure, with hypertension.  She was found to be in rapid atrial fibrillation, placed on IV diltiazem drip.  Review of EKGs from May show that she was having persistent atrial fibrillation.  Carvedilol was increased to 18.75 mg twice daily.  On 07/27/2017 she was seen by electrophysiologist Dr. Rayann [name].  At that point the patient's rates have improved on diltiazem, and the patient was converted to p.o. diltiazem 240 mg daily.  It was their recommendation that she begin on Xarelto 20 mg daily secondary to CHADS VASC Score of 5.  She was to have a follow-up visit in the A. fib clinic to evaluate for Tikosyn admission.  Was also recommended that the patient be placed on the sleep study scheduled to evaluate for obstructive sleep apnea.  On day of discharge the patient was seen and examined by Dr. Peter [name].  Blood pressure was mildly elevated on evaluation, but no medication changes were made at that time.  The patient was anxious.  There is an issue with compliance which was worrisome.  Atrial fibrillation were between 101 115 bpm on review of telemetry.  She also had some mild dyspnea.  In the afib clinc, 12/19, for f/u. The pt is mildly dyspneic at rest but states that she smoked until one month agao and usually has some shortness of breath. Her weight is up 5 lbs and she is in Afib with RVR at 129 bpm. She has not taken her meds thia am as she did not a  chance to eat breakfast. Her cousin is with her and has concerns that the pt is not taking her meds on a consistent manner.  She just started xarelto 12/13 and would not be eligible for tikosyn or cardioversion until she has been on anticoagulation x 3 weeks. She states that she is taking this drug as prescribed.  F/u in afib clinic 12/21. Pt feels worse, despite increasing carvedilol to 25 mg bid and increasing lasix to 40 mg bid, this past Wednesday, she has gained another 5 lbs (10 since d/c from hospital 12/15) and her afib is faster in the 140's. Xarelto just started 12/13, she says that she has been taking all meds as prescribed but her cousin that was with her Wednesday expressed concerns for non compliance issues.Pt also states that she is bleeding form her bottom, " like a period."   Today, she denies symptoms of palpitations, chest pain,  orthopnea, PND,   dizziness, presyncope, syncope, or neurologic sequela. + for shortness of breath and fatigue, LEE. The patient is tolerating medications without difficulties and is otherwise without complaint today.   Past Medical History:  Diagnosis Date  . Arthritis   . Asthma   . Cardiomyopathy, ischemic    LVEF 50-55% as of May 2018  . Chronic combined systolic and diastolic heart failure ([place])   . Hypertension   . Left main coronary artery disease 11/04/2014  . PAF (paroxysmal atrial fibrillation) ([place])   . S/P CABG x 2 with clipping of LA appendage 11/14/2014   LIMA  to LAD, SVG to OM, EVH via right thigh   Past Surgical History:  Procedure Laterality Date  . CARDIOVERSION N/A 11/18/2014   Procedure: CARDIOVERSION;  Surgeon: [name], MD;  Location: [place];  Service: Cardiovascular;  Laterality: N/A;  . CLIPPING OF ATRIAL APPENDAGE N/A 11/14/2014   Procedure: CLIPPING OF ATRIAL APPENDAGE;  Surgeon: [name], MD;  Location: [place];  Service: Open Heart Surgery;  Laterality: N/A;  . CORONARY ARTERY BYPASS GRAFT N/A 11/14/2014    Procedure: CORONARY ARTERY BYPASS GRAFTING (CABG)TIMES 2 USING LEFT INTERNAL MAMMARY ARTERY AND RIGHT SAPHENOUS VEIN HARVESTED ENDOSCOPICALLY;  Surgeon: [name], MD;  Location: [place];  Service: Open Heart Surgery;  Laterality: N/A;  . LEFT HEART CATHETERIZATION WITH CORONARY ANGIOGRAM N/A 11/04/2014   Procedure: LEFT HEART CATHETERIZATION WITH CORONARY ANGIOGRAM;  Surgeon: Troy Sine, MD;  Location: [facility] CATH LAB;  Service: Cardiovascular;  Laterality: N/A;  . TEE WITHOUT CARDIOVERSION N/A 11/14/2014   Procedure: TRANSESOPHAGEAL ECHOCARDIOGRAM (TEE);  Surgeon: [name], MD;  Location: [place];  Service: Open Heart Surgery;  Laterality: N/A;  . TEMPORARY PACEMAKER INSERTION  11/04/2014   Procedure: TEMPORARY PACEMAKER INSERTION;  Surgeon: Troy Sine, MD;  Location: [facility] CATH LAB;  Service: Cardiovascular;;    Current Facility-Administered Medications  Medication Dose Route Frequency Provider Last Rate Last Dose  . acetaminophen (TYLENOL) tablet 650 mg  650 mg Oral Q4H PRN Baldwin Jamaica, PA-C      . furosemide (LASIX) injection 40 mg  40 mg Intravenous Once Lajean Saver, MD      . nitroGLYCERIN (NITROSTAT) SL tablet 0.4 mg  0.4 mg Sublingual Q5 Min x 3 PRN Baldwin Jamaica, PA-C      . ondansetron [facility]) injection 4 mg  4 mg Intravenous Q6H PRN Baldwin Jamaica, PA-C       Current Outpatient Medications  Medication Sig Dispense Refill  . acetaminophen (TYLENOL) 500 MG tablet Take 500 mg by mouth every 6 (six) hours as needed for moderate pain.     Marland Kitchen atorvastatin (LIPITOR) 10 MG tablet TAKE 1 TABLET EVERY DAY  AT  6PM (Patient taking differently: TAKE 10 mg TABLET EVERY DAY) 90 tablet 3  . carvedilol (COREG) 25 MG tablet Take 1 tablet (25 mg total) by mouth 2 (two) times daily with a meal. 60 tablet 3  . diltiazem (CARDIZEM CD) 240 MG 24 hr capsule Take 1 capsule (240 mg total) by mouth daily. 30 capsule 4  . febuxostat ([name]) 40 MG tablet Take 40 mg by mouth daily.    .  fluticasone (FLONASE) 50 MCG/ACT nasal spray Place 1 spray into both nostrils daily. (Patient taking differently: Place 1 spray into both nostrils daily as needed for allergies. ) 16 g 2  . furosemide (LASIX) 40 MG tablet Take 1 tablet (40 mg total) by mouth daily. 30 tablet 6  . magnesium oxide (MAG-OX) 400 (241.3 Mg) MG tablet Take 1 tablet (400 mg total) by mouth 2 (two) times daily. 60 tablet 3  . potassium chloride SA (K-DUR,KLOR-CON) 20 MEQ tablet Take 2 tablets (40 mEq total) by mouth 2 (two) times daily. 60 tablet 4  . ranitidine (ZANTAC) 150 MG capsule Take 150 mg by mouth daily as needed for heartburn.     . rivaroxaban (XARELTO) 20 MG TABS tablet Take 1 tablet (20 mg total) by mouth daily with supper. 30 tablet 4    Allergies  Allergen Reactions  . Bee Venom Anaphylaxis  .  Codeine Nausea And Vomiting  . Shrimp [Shellfish Allergy] Swelling  . Tomato     Pt states it gives her gout    Social History   Socioeconomic History  . Marital status: Widowed    Spouse name: Not on file  . Number of children: Not on file  . Years of education: Not on file  . Highest education level: Not on file  Social Needs  . Financial resource strain: Not on file  . Food insecurity - worry: Not on file  . Food insecurity - inability: Not on file  . Transportation needs - medical: Not on file  . Transportation needs - non-medical: Not on file  Occupational History  . Not on file  Tobacco Use  . Smoking status: Current Some Day Smoker    Packs/day: 1.00    Years: 32.00    Pack years: 32.00    Types: Cigarettes  . Smokeless tobacco: Never Used  . Tobacco comment: occasional puff here and there - 02/09/15  Substance and Sexual Activity  . Alcohol use: No    Alcohol/week: 0.0 oz  . Drug use: No  . Sexual activity: Not on file  Other Topics Concern  . Not on file  Social History Narrative  . Not on file    Family History  Problem Relation Age of Onset  . Cancer Mother   . Heart  disease Father     ROS- All systems are reviewed and negative except as per the HPI above  Physical Exam: Vitals:   08/04/17 1520 08/04/17 1545 08/04/17 1548 08/04/17 1600  BP:  (!) 126/93  125/87  Pulse: (!) 56 79 87 (!) 52  Resp:      Temp:      SpO2: 99% 97% 97% 97%  Weight:      Height:       Wt Readings from Last 3 Encounters:  08/04/17 250 lb (113.4 kg)  08/04/17 225 lb (102.1 kg)  08/02/17 220 lb (99.8 kg)    Labs: Lab Results  Component Value Date   NA 139 08/04/2017   K 3.4 (L) 08/04/2017   CL 110 08/04/2017   CO2 19 (L) 08/04/2017   GLUCOSE 133 (H) 08/04/2017   BUN 27 (H) 08/04/2017   CREATININE 1.38 (H) 08/04/2017   CALCIUM 8.8 (L) 08/04/2017   PHOS 3.0 11/09/2014   MG 1.7 07/29/2017   Lab Results  Component Value Date   INR 1.24 07/26/2017   Lab Results  Component Value Date   CHOL 116 11/12/2014   HDL 34 (L) 11/12/2014   LDLCALC 60 11/12/2014   TRIG 111 11/12/2014     GEN- The patient is well appearing, alert and oriented x 3 today.   Head- normocephalic, atraumatic Eyes-  Sclera clear, conjunctiva pink Ears- hearing intact Oropharynx- clear Neck- supple, no JVP Lymph- no cervical lymphadenopathy Lungs- Deminished ausculation bilaterally, normal work of breathing Heart- Rapid irregular rate and rhythm, no murmurs, rubs or gallops, PMI not laterally displaced GI- soft, NT, ND, + BS Extremities- no clubbing, cyanosis, 2+ lower extremity edema MS- no significant deformity or atrophy Skin- no rash or lesion Psych- euthymic mood, full affect Neuro- strength and sensation are intact  EKG-afib with rvr at 141 bpm with qtc at 512 ms Echo-Study Conclusions  - Left ventricle: Posterior lateral hypokinesis. The cavity size   was normal. Systolic function was normal. The estimated ejection   fraction was in the range of 50% to 55%. Left ventricular  diastolic function parameters were normal. - Aortic valve: There was mild regurgitation. -  Mitral valve: There was mild regurgitation. - Left atrium: The atrium was moderately dilated. - Atrial septum: No defect or patent foramen ovale was identified.    Assessment and Plan: 1. Persistent afib with RVR/CHF Pt with recent admission and has not done well since d/c. Compliance issues may be a concern  Pt has not responded to increase in rate control or increase with furosemide with persistent afib with RVR  In the 140's and 10 weight gain since d/c. She is very symptomatic and hospitalization is needed. I am also concerned re her new c/o of bleeding from her bottom, " like a period", newly on anticoagualtion  To ER for  admission  Butch Penny C. Mila Homer [facility] [address] [place], [place] [address] [phone]   I have seen and examined this patient with Roderic Palau.  Agree with above, note added to reflect my findings.  On exam, cardiac, and regular, no murmurs, lungs clear.  Presented to atrial fibrillation clinic with shortness of breath and atrial fibrillation with rapid rates.  She has been on anticoagulation since 12/13.  She would need anti-coagulation for 3 weeks prior to cardioversion.  Her heart rate is quite fast and thus on a diltiazem drip in the emergency room.  We Jamye Balicki start her home medications tonight and increase her dose of diltiazem.  We Deadra Diggins also start diltiazem 60 mg every 6 hours overnight and increase her long-acting diltiazem to 360 mg in the morning.  Hopefully she Candis Kabel be able to come off the diltiazem drip tonight and be discharged tomorrow.  We Almadelia Looman give her 1 dose of IV Lasix today.  Ambyr Qadri M. Therasa Lorenzi MD 08/04/2017 4:46 PM

## 2017-08-04 NOTE — ED Notes (Signed)
Per admitting md to dc iv cardizem after po dose

## 2017-08-04 NOTE — Progress Notes (Signed)
Primary Care Physician: [name], MD Referring Physician: [facility] F/U Cardiologist: Dr. Rosalie Gums [name] [name] is a 71 y.o. female with a h/o hypertension with multiple medication adjustments,paroxsymal atrial fibrillation, CHADS VASC Score 5, status post left atrial appendage clipping, coronary artery disease status post CABG, hyperlipidemia, ischemic cardiomyopathy, with normal LV systolic function per echo May 2018.  The patient presented to the hospital, 12/12, with complaints of increasing dyspnea and symptomatic chronic combined heart failure, with hypertension.  She was found to be in rapid atrial fibrillation, placed on IV diltiazem drip.  Review of EKGs from May show that she was having persistent atrial fibrillation.  Carvedilol was increased to 18.75 mg twice daily.  On 07/27/2017 she was seen by electrophysiologist Dr. Rayann Heman.  At that point the patient's rates have improved on diltiazem, and the patient was converted to p.o. diltiazem 240 mg daily.  It was their recommendation that she begin on Xarelto 20 mg daily secondary to CHADS VASC Score of 5.  She was to have a follow-up visit in the A. fib clinic to evaluate for Tikosyn admission.  Was also recommended that the patient be placed on the sleep study scheduled to evaluate for obstructive sleep apnea.  On day of discharge the patient was seen and examined by Dr. Peter Martinique.  Blood pressure was mildly elevated on evaluation, but no medication changes were made at that time.  The patient was anxious.  There is an issue with compliance which was worrisome.  Atrial fibrillation were between 101 115 bpm on review of telemetry.  She also had some mild dyspnea.  In the afib clinc, 12/19, for f/u. The pt is mildly dyspneic at rest but states that she smoked until one month agao and usually has some shortness of breath. Her weight is up 5 lbs and she is in Afib with RVR at 129 bpm. She has not taken her meds thia am as she did not a  chance to eat breakfast. Her cousin is with her and has concerns that the pt is not taking her meds on a consistent manner.  She just started xarelto 12/13 and would not be eligible for tikosyn or cardioversion until she has been on anticoagulation x 3 weeks. She states that she is taking this drug as prescribed.  F/u in afib clinic 12/21. Pt feels worse, despite increasing carvedilol to 25 mg bid and increasing lasix to 40 mg bid, this past Wednesday, she has gained another 5 lbs (10 since d/c from hospital 12/15) and her afib is faster in the 140's. Xarelto just started 12/13, she says that she has been taking all meds as prescribed but her cousin that was with her Wednesday expressed concerns for non compliance issues.Pt also states that she is bleeding form her bottom, " like a period."   Today, she denies symptoms of palpitations, chest pain,  orthopnea, PND,   dizziness, presyncope, syncope, or neurologic sequela. + for shortness of breath and fatigue, LEE. The patient is tolerating medications without difficulties and is otherwise without complaint today.   Past Medical History:  Diagnosis Date  . Arthritis   . Asthma   . Cardiomyopathy, ischemic    LVEF 50-55% as of May 2018  . Chronic combined systolic and diastolic heart failure ([place])   . Hypertension   . Left main coronary artery disease 11/04/2014  . PAF (paroxysmal atrial fibrillation) ([place])   . S/P CABG x 2 with clipping of LA appendage 11/14/2014  LIMA to LAD, SVG to OM, EVH via right thigh   Past Surgical History:  Procedure Laterality Date  . CARDIOVERSION N/A 11/18/2014   Procedure: CARDIOVERSION;  Surgeon: [name], MD;  Location: [place];  Service: Cardiovascular;  Laterality: N/A;  . CLIPPING OF ATRIAL APPENDAGE N/A 11/14/2014   Procedure: CLIPPING OF ATRIAL APPENDAGE;  Surgeon: [name], MD;  Location: [place];  Service: Open Heart Surgery;  Laterality: N/A;  . CORONARY ARTERY BYPASS GRAFT N/A 11/14/2014    Procedure: CORONARY ARTERY BYPASS GRAFTING (CABG)TIMES 2 USING LEFT INTERNAL MAMMARY ARTERY AND RIGHT SAPHENOUS VEIN HARVESTED ENDOSCOPICALLY;  Surgeon: [name], MD;  Location: [place];  Service: Open Heart Surgery;  Laterality: N/A;  . LEFT HEART CATHETERIZATION WITH CORONARY ANGIOGRAM N/A 11/04/2014   Procedure: LEFT HEART CATHETERIZATION WITH CORONARY ANGIOGRAM;  Surgeon: Troy Sine, MD;  Location[facility] C[facility] LAB;  Service: Cardiovascular;  Laterality: N/A;  . TEE WITHOUT CARDIOVERSION N/A 11/14/2014   Procedure: TRANSESOPHAGEAL ECHOCARDIOGRAM (TEE);  Surgeon: [name], MD;  Location: [place];  Service: Open Heart Surgery;  Laterality: N/A;  . TEMPORARY PACEMAKER INSERTION  11/04/2014   Procedure: TEMPORARY PACEMAKER INSERTION;  Surgeon: Troy Sine, MD;  Location: [facility] CATH LAB;  Service: Cardiovascular;;    Current Outpatient Medications  Medication Sig Dispense Refill  . acetaminophen (TYLENOL) 500 MG tablet Take 500 mg by mouth every 6 (six) hours as needed for moderate pain.     Marland Kitchen atorvastatin (LIPITOR) 10 MG tablet TAKE 1 TABLET EVERY DAY  AT  6PM (Patient taking differently: TAKE 10 mg TABLET EVERY DAY) 90 tablet 3  . carvedilol (COREG) 25 MG tablet Take 1 tablet (25 mg total) by mouth 2 (two) times daily with a meal. 60 tablet 3  . diltiazem (CARDIZEM CD) 240 MG 24 hr capsule Take 1 capsule (240 mg total) by mouth daily. 30 capsule 4  . febuxostat ([name]) 40 MG tablet Take 40 mg by mouth daily.    . fluticasone (FLONASE) 50 MCG/ACT nasal spray Place 1 spray into both nostrils daily. 16 g 2  . furosemide (LASIX) 40 MG tablet Take 1 tablet (40 mg total) by mouth daily. 30 tablet 6  . magnesium oxide (MAG-OX) 400 (241.3 Mg) MG tablet Take 1 tablet (400 mg total) by mouth 2 (two) times daily. 60 tablet 3  . potassium chloride SA (K-DUR,KLOR-CON) 20 MEQ tablet Take 2 tablets (40 mEq total) by mouth 2 (two) times daily. 60 tablet 4  . ranitidine (ZANTAC) 150 MG capsule Take 150 mg  by mouth daily as needed for heartburn.     . rivaroxaban (XARELTO) 20 MG TABS tablet Take 1 tablet (20 mg total) by mouth daily with supper. 30 tablet 4   No current facility-administered medications for this encounter.     Allergies  Allergen Reactions  . Bee Venom Anaphylaxis  . Codeine Nausea And Vomiting  . Shrimp [Shellfish Allergy] Swelling  . Tomato     Pt states it gives her gout    Social History   Socioeconomic History  . Marital status: Widowed    Spouse name: Not on file  . Number of children: Not on file  . Years of education: Not on file  . Highest education level: Not on file  Social Needs  . Financial resource strain: Not on file  . Food insecurity - worry: Not on file  . Food insecurity - inability: Not on file  . Transportation needs - medical:  Not on file  . Transportation needs - non-medical: Not on file  Occupational History  . Not on file  Tobacco Use  . Smoking status: Current Some Day Smoker    Packs/day: 1.00    Years: 32.00    Pack years: 32.00    Types: Cigarettes  . Smokeless tobacco: Never Used  . Tobacco comment: occasional puff here and there - 02/09/15  Substance and Sexual Activity  . Alcohol use: No    Alcohol/week: 0.0 oz  . Drug use: No  . Sexual activity: Not on file  Other Topics Concern  . Not on file  Social History Narrative  . Not on file    Family History  Problem Relation Age of Onset  . Cancer Mother   . Heart disease Father     ROS- All systems are reviewed and negative except as per the HPI above  Physical Exam: Vitals:   08/04/17 1055  BP: 116/70  Pulse: (!) 141  SpO2: 93%  Weight: 225 lb (102.1 kg)  Height: 5\' 7"  (1.702 m)   Wt Readings from Last 3 Encounters:  08/04/17 225 lb (102.1 kg)  08/02/17 220 lb (99.8 kg)  07/28/17 215 lb 4.8 oz (97.7 kg)    Labs: Lab Results  Component Value Date   NA 142 07/29/2017   K 3.5 07/29/2017   CL 107 07/29/2017   CO2 24 07/29/2017   GLUCOSE 132 (H)  07/29/2017   BUN 24 (H) 07/29/2017   CREATININE 1.25 (H) 07/29/2017   CALCIUM 8.9 07/29/2017   PHOS 3.0 11/09/2014   MG 1.7 07/29/2017   Lab Results  Component Value Date   INR 1.24 07/26/2017   Lab Results  Component Value Date   CHOL 116 11/12/2014   HDL 34 (L) 11/12/2014   LDLCALC 60 11/12/2014   TRIG 111 11/12/2014     GEN- The patient is well appearing, alert and oriented x 3 today.   Head- normocephalic, atraumatic Eyes-  Sclera clear, conjunctiva pink Ears- hearing intact Oropharynx- clear Neck- supple, no JVP Lymph- no cervical lymphadenopathy Lungs- Deminished ausculation bilaterally, normal work of breathing Heart- Rapid irregular rate and rhythm, no murmurs, rubs or gallops, PMI not laterally displaced GI- soft, NT, ND, + BS Extremities- no clubbing, cyanosis, 2+ lower extremity edema MS- no significant deformity or atrophy Skin- no rash or lesion Psych- euthymic mood, full affect Neuro- strength and sensation are intact  EKG-afib with rvr at 141 bpm with qtc at 512 ms Echo-Study Conclusions  - Left ventricle: Posterior lateral hypokinesis. The cavity size   was normal. Systolic function was normal. The estimated ejection   fraction was in the range of 50% to 55%. Left ventricular   diastolic function parameters were normal. - Aortic valve: There was mild regurgitation. - Mitral valve: There was mild regurgitation. - Left atrium: The atrium was moderately dilated. - Atrial septum: No defect or patent foramen ovale was identified.    Assessment and Plan: 1. Persistent afib with RVR/CHF Pt with recent admission and has not done well since d/c. Compliance issues may be a concern  Pt has not responded to increase in rate control or increase with furosemide with persistent afib with RVR  In the 140's and 10 weight gain since d/c. She is very symptomatic and hospitalization is needed. I am also concerned re her new c/o of bleeding from her bottom, " like a  period", newly on anticoagualtion  To ER for  admission  Butch Penny C. Kayleen Memos,  [facility] [address] [place], [place] [address] [phone]

## 2017-08-04 NOTE — ED Notes (Signed)
Attempted report 

## 2017-08-04 NOTE — ED Notes (Signed)
Daughter Kayln Garceau -- [phone]. Called - would like any other information if possible.

## 2017-08-04 NOTE — ED Provider Notes (Signed)
[name] EMERGENCY DEPARTMENT Provider Note   CSN: [id number] Arrival date & time: 08/04/17  1123     History   Chief Complaint Chief Complaint  Patient presents with  . Shortness of Breath    HPI   Blood pressure (!) 119/51, pulse (!) 37, temperature 97.8 F (36.6 C), resp. rate 12, height 5' 7.5" (1.715 m), weight 113.4 kg (250 lb), SpO2 97 %.  [name] [name] is a 71 y.o. female with extensive cardiac history, recently diagnosed with atrial fibrillation, recently initiated on Xarelto (12/13) sent from A. fib clinic for persistent A. fib with RVR, 5 pound weight gain in the last week.  Heart rate in the 140s, she has been dyspneic for the last 2 months.  Patient says she has been compliant with her medications however cousin states she may not be taking them appropriately.  Patient denies any chest pain, cough, increasing peripheral edema.  She states that she has been menstruating for 2 months.  She her primary care doctor is aware and she has been evaluated by gynecology, unclear if the blood that she is discussing is coming from the rectum or vagina.  Past Medical History:  Diagnosis Date  . Arthritis   . Asthma   . Cardiomyopathy, ischemic    LVEF 50-55% as of May 2018  . Chronic combined systolic and diastolic heart failure ([place])   . Hypertension   . Left main coronary artery disease 11/04/2014  . PAF (paroxysmal atrial fibrillation) ([place])   . S/P CABG x 2 with clipping of LA appendage 11/14/2014   LIMA to LAD, SVG to OM, EVH via right thigh    Patient Active Problem List   Diagnosis Date Noted  . Paroxysmal atrial fibrillation ([place]) 02/08/2017  . Cardiomyopathy, ischemic 02/08/2017  . Dysphagia 02/08/2017  . CKD (chronic kidney disease), stage III ([place]) 01/30/2017  . Pain in shoulder 02/08/2016  . Breast pain, left 02/08/2016  . Bilateral arm numbness and tingling while sleeping 02/08/2016  . Painful lumpy left breast 09/23/2015  . [place]  intertrigo 02/11/2015  . S/P CABG x 2 11/14/2014  . Accelerated hypertension   . Cardiac arrest ([place]) 11/04/2014  . Left main coronary artery disease 11/04/2014  . Coronary artery disease due to lipid rich plaque   . Acute respiratory failure with hypoxemia ([place])   . Essential hypertension   . Atrial fibrillation with RVR ([place])   . Hypokalemia 10/30/2014  . Chronic combined systolic and diastolic CHF (congestive heart failure) ([place]) 10/30/2014  . NSTEMI (non-ST elevated myocardial infarction) ([place])   . DOE (dyspnea on exertion)   . CAP (community acquired pneumonia) 10/29/2014    Past Surgical History:  Procedure Laterality Date  . CARDIOVERSION N/A 11/18/2014   Procedure: CARDIOVERSION;  Surgeon: [name], MD;  Location: [place];  Service: Cardiovascular;  Laterality: N/A;  . CLIPPING OF ATRIAL APPENDAGE N/A 11/14/2014   Procedure: CLIPPING OF ATRIAL APPENDAGE;  Surgeon: [name], MD;  Location: [place];  Service: Open Heart Surgery;  Laterality: N/A;  . CORONARY ARTERY BYPASS GRAFT N/A 11/14/2014   Procedure: CORONARY ARTERY BYPASS GRAFTING (CABG)TIMES 2 USING LEFT INTERNAL MAMMARY ARTERY AND RIGHT SAPHENOUS VEIN HARVESTED ENDOSCOPICALLY;  Surgeon: [name], MD;  Location: [place];  Service: Open Heart Surgery;  Laterality: N/A;  . LEFT HEART CATHETERIZATION WITH CORONARY ANGIOGRAM N/A 11/04/2014   Procedure: LEFT HEART CATHETERIZATION WITH CORONARY ANGIOGRAM;  Surgeon: Troy Sine, MD;  [facility] LAB;  Service: Cardiovascular;  Laterality: N/A;  . TEE WITHOUT CARDIOVERSION N/A 11/14/2014   Procedure: TRANSESOPHAGEAL ECHOCARDIOGRAM (TEE);  Surgeon: [name], MD;  Location: [place];  Service: Open Heart Surgery;  Laterality: N/A;  . TEMPORARY PACEMAKER INSERTION  11/04/2014   Procedure: TEMPORARY PACEMAKER INSERTION;  Surgeon: Troy Sine, MD;  Location: [facility] CATH LAB;  Service: Cardiovascular;;    OB History    No data available       Home Medications     Prior to Admission medications   Medication Sig Start Date End Date Taking? Authorizing Provider  acetaminophen (TYLENOL) 500 MG tablet Take 500 mg by mouth every 6 (six) hours as needed for moderate pain.     [provider]  atorvastatin (LIPITOR) 10 MG tablet TAKE 1 TABLET EVERY DAY  AT  6PM Patient taking differently: TAKE 10 mg TABLET EVERY DAY 06/06/16   Hilty, Nadean Corwin, MD  carvedilol (COREG) 25 MG tablet Take 1 tablet (25 mg total) by mouth 2 (two) times daily with a meal. 08/02/17   Sherran Needs, NP  diltiazem (CARDIZEM CD) 240 MG 24 hr capsule Take 1 capsule (240 mg total) by mouth daily. 07/30/17   Lendon Colonel, NP  febuxostat ([name]) 40 MG tablet Take 40 mg by mouth daily.    [provider]  fluticasone (FLONASE) 50 MCG/ACT nasal spray Place 1 spray into both nostrils daily. 07/30/17   Lendon Colonel, NP  furosemide (LASIX) 40 MG tablet Take 1 tablet (40 mg total) by mouth daily. 07/30/17   Lendon Colonel, NP  magnesium oxide (MAG-OX) 400 (241.3 Mg) MG tablet Take 1 tablet (400 mg total) by mouth 2 (two) times daily. 07/29/17   Lendon Colonel, NP  potassium chloride SA (K-DUR,KLOR-CON) 20 MEQ tablet Take 2 tablets (40 mEq total) by mouth 2 (two) times daily. 07/29/17   Lendon Colonel, NP  ranitidine (ZANTAC) 150 MG capsule Take 150 mg by mouth daily as needed for heartburn.     [provider]  rivaroxaban (XARELTO) 20 MG TABS tablet Take 1 tablet (20 mg total) by mouth daily with supper. 07/29/17   Lendon Colonel, NP    Family History Family History  Problem Relation Age of Onset  . Cancer Mother   . Heart disease Father     Social History Social History   Tobacco Use  . Smoking status: Current Some Day Smoker    Packs/day: 1.00    Years: 32.00    Pack years: 32.00    Types: Cigarettes  . Smokeless tobacco: Never Used  . Tobacco comment: occasional puff here and there - 02/09/15  Substance Use Topics   . Alcohol use: No    Alcohol/week: 0.0 oz  . Drug use: No     Allergies   Bee venom; Codeine; Shrimp [shellfish allergy]; and Tomato   Review of Systems Review of Systems  A complete review of systems was obtained and all systems are negative except as noted in the HPI and PMH.   Physical Exam Updated Vital Signs BP (!) 126/93   Pulse 87   Temp 97.8 F (36.6 C)   Resp (!) 32   Ht 5' 7.5" (1.715 m)   Wt 113.4 kg (250 lb)   SpO2 97%   BMI 38.58 kg/m   Physical Exam  Constitutional: She is oriented to person, place, and time. She appears well-developed and well-nourished. No distress.  HENT:  Head: Normocephalic and atraumatic.  Mouth/Throat: Oropharynx is clear and moist.  Eyes: Conjunctivae and EOM are normal. Pupils are equal, round, and reactive to light.  Neck: Normal range of motion.  Cardiovascular: Normal rate, regular rhythm and intact distal pulses.  Pulmonary/Chest: Effort normal and breath sounds normal.  Speaking in short sentences.  Tachypneic  Abdominal: Soft. There is no tenderness.  Genitourinary:  Genitourinary Comments: Digital rectal exam with normal rectal tone, normal stool color  Musculoskeletal: Normal range of motion.  Neurological: She is alert and oriented to person, place, and time.  Skin: She is not diaphoretic.  Psychiatric: She has a normal mood and affect.  Nursing note and vitals reviewed.    ED Treatments / Results  Labs (all labs ordered are listed, but only abnormal results are displayed) Labs Reviewed  CBC WITH DIFFERENTIAL/PLATELET - Abnormal; Notable for the following components:      Result Value   RDW 16.9 (*)    Platelets 132 (*)    All other components within normal limits  BASIC METABOLIC PANEL - Abnormal; Notable for the following components:   Potassium 3.4 (*)    CO2 19 (*)    Glucose, Bld 133 (*)    BUN 27 (*)    Creatinine, Ser 1.38 (*)    Calcium 8.8 (*)    GFR calc non Af Amer 37 (*)    GFR calc Af  Amer 43 (*)    All other components within normal limits  BRAIN NATRIURETIC PEPTIDE - Abnormal; Notable for the following components:   B Natriuretic Peptide 697.5 (*)    All other components within normal limits  URINALYSIS, ROUTINE W REFLEX MICROSCOPIC  I-STAT TROPONIN, ED  POC OCCULT BLOOD, ED    EKG  EKG Interpretation  Date/Time:  Friday August 04 2017 11:29:01 EST Ventricular Rate:  119 PR Interval:    QRS Duration: 100 QT Interval:  338 QTC Calculation: 475 R Axis:   -2 Text Interpretation:  Atrial fibrillation with rapid ventricular response Cannot rule out Anterior infarct , age undetermined ST & T wave abnormality, consider lateral ischemia Abnormal ECG Confirmed by Davonna Belling [phone]) on 08/04/2017 1:50:53 PM       Radiology Dg Chest 2 View  Result Date: 08/04/2017 CLINICAL DATA:  shortness of breath,cardiac hx,shortness of breath 2 months and recently diagnosed with a-fib EXAM: CHEST  2 VIEW COMPARISON:  07/28/2017 FINDINGS: Stable changes from prior cardiac surgery. Cardiac silhouette is mild-to-moderately enlarged. No mediastinal or hilar masses or evidence of adenopathy. Linear opacity noted in the left upper lobe lingula consistent with scarring or atelectasis, similar to the prior study. There are prominent bronchovascular markings. No evidence of pneumonia. No convincing pulmonary edema. No pleural effusion or pneumothorax. Skeletal structures are demineralized but grossly intact. IMPRESSION: 1. No acute cardiopulmonary disease. 2. Cardiomegaly and post cardiac surgery changes stable from the prior chest radiographs. Chronic prominence of the bronchovascular markings. Electronically Signed   By: Lajean Manes M.D.   On: 08/04/2017 12:12    Procedures Procedures (including critical care time)  Medications Ordered in ED Medications  diltiazem (CARDIZEM) 100 mg in dextrose 5% 194mL (1 mg/mL) infusion (7.5 mg/hr Intravenous Rate/Dose Change 08/04/17 1520)      Initial Impression / Assessment and Plan / ED Course  I have reviewed the triage vital signs and the nursing notes.  Pertinent labs & imaging results that were available during my care of the patient were reviewed by me and considered in my medical decision making (see chart for  details).     Vitals:   08/04/17 1518 08/04/17 1520 08/04/17 1545 08/04/17 1548  BP: 121/90  (!) 126/93   Pulse: 77 (!) 56 79 87  Resp:      Temp:      SpO2: 99% 99% 97% 97%  Weight:      Height:        Medications  diltiazem (CARDIZEM) 100 mg in dextrose 5% 178mL (1 mg/mL) infusion (7.5 mg/hr Intravenous Rate/Dose Change 08/04/17 1520)    TEMARA LANUM is 71 y.o. female presenting with A. fib with RVR.  Worsening shortness of breath over the last several months.  Questionable compliance, she recently started anticoagulation with Xarelto on the 13th of this month.  She reports that she has a period that will not stop.  It is unclear if this is coming from the vagina or the rectum, on my exam she has normal stool color.  No gross blood in the perineum.  FOBT negative, no significant anemia.  Patient will be started on Cardizem drip.  Discussed with audiologist who will evaluate the patient and update on whether this patient will be admitted to their service or hospitalist.   Final Clinical Impressions(s) / ED Diagnoses   Final diagnoses:  Atrial fibrillation with RVR [facility])    ED Discharge Orders    None       Karen Kays Charna Elizabeth 08/04/17 1609    Daphnee Preiss, Charna Elizabeth 08/04/17 1612    Lajean Saver, MD 08/04/17 2320

## 2017-08-04 NOTE — ED Notes (Signed)
Purewick catheter applied and patient informed that a urine sample is needed.

## 2017-08-04 NOTE — Progress Notes (Deleted)
Primary Care Physician: [name], [name] Referring Physician: [facility] F/U Cardiologist: Dr. Rosalie Gums [name] [name] is a 71 y.o. female with a h/o hypertension with multiple medication adjustments,paroxsymal atrial fibrillation, CHADS VASC Score 5, status post left atrial appendage clipping, coronary artery disease status post CABG, hyperlipidemia, ischemic cardiomyopathy, with normal LV systolic function per echo May 2018.  The patient presented to the hospital, 12/12, with complaints of increasing dyspnea and symptomatic chronic combined heart failure, with hypertension.  She was found to be in rapid atrial fibrillation, placed on IV diltiazem drip.  Review of EKGs from May show that she was having persistent atrial fibrillation.  Carvedilol was increased to 18.75 mg twice daily.  On 07/27/2017 she was seen by electrophysiologist Dr. Rayann Heman.  At that point the patient's rates have improved on diltiazem, and the patient was converted to p.o. diltiazem 240 mg daily.  It was their recommendation that she begin on Xarelto 20 mg daily secondary to CHADS VASC Score of 5.  She was to have a follow-up visit in the A. fib clinic to evaluate for Tikosyn admission.  Was also recommended that the patient be placed on the sleep study scheduled to evaluate for obstructive sleep apnea.  On day of discharge the patient was seen and examined by Dr. Peter Martinique.  Blood pressure was mildly elevated on evaluation, but no medication changes were made at that time.  The patient was anxious.  There is an issue with compliance which was worrisome.  Atrial fibrillation were between 101 115 bpm on review of telemetry.  She also had some mild dyspnea.  In the afib clinc, 12/19, for f/u. The pt is mildly dyspneic at rest but states that she smoked until one month agao and usually has some shortness of breath. Her weight is up 5 lbs and she is in Afib with RVR at 129 bpm. She has not taken her meds thia am as she did not a  chance to eat breakfast. Her cousin is with her and has concerns that the pt is not taking her meds on a consistent manner.  She just started xarelto 12/13 and would not be eligible for tikosyn or cardioversion until she has been on anticoagulation x 3 weeks. She states that she is taking this drug as prescribed.  F/u in afib clinic 12/21. Pt feels worse, despite increasing carvedilol to 25 mg bid and increasing lasix to 40 mg bid, this past Wednesday, she has gained another 5 lbs (10 since d/c from hospital 12/15) and her afib is faster in the 140's. Xarelto just started 12/13, she says that she has been taking all meds as prescribed but her cousin that was with her Wednesday expressed concerns for non compliance issues.Pt also states that she is bleeding form her bottom, " like a period."   Today, she denies symptoms of palpitations, chest pain,  orthopnea, PND,   dizziness, presyncope, syncope, or neurologic sequela. + for shortness of breath and fatigue, LEE. The patient is tolerating medications without difficulties and is otherwise without complaint today.   Past Medical History:  Diagnosis Date  . Arthritis   . Asthma   . Cardiomyopathy, ischemic    LVEF 50-55% as of May 2018  . Chronic combined systolic and diastolic heart failure ([place])   . Hypertension   . Left main coronary artery disease 11/04/2014  . PAF (paroxysmal atrial fibrillation) ([place])   . S/P CABG x 2 with clipping of LA appendage 11/14/2014  LIMA to LAD, SVG to OM, EVH via right thigh   Past Surgical History:  Procedure Laterality Date  . CARDIOVERSION N/A 11/18/2014   Procedure: CARDIOVERSION;  Surgeon: [name], [name];  Location: [place];  Service: Cardiovascular;  Laterality: N/A;  . CLIPPING OF ATRIAL APPENDAGE N/A 11/14/2014   Procedure: CLIPPING OF ATRIAL APPENDAGE;  Surgeon: [name], [name];  Location: [place];  Service: Open Heart Surgery;  Laterality: N/A;  . CORONARY ARTERY BYPASS GRAFT N/A 11/14/2014    Procedure: CORONARY ARTERY BYPASS GRAFTING (CABG)TIMES 2 USING LEFT INTERNAL MAMMARY ARTERY AND RIGHT SAPHENOUS VEIN HARVESTED ENDOSCOPICALLY;  Surgeon: [name], [name];  Location: ;  Service: Open Heart Surgery;  Laterality: N/A;  . LEFT HEART CATHETERIZATION WITH CORONARY ANGIOGRAM N/A 11/04/2014   Procedure: LEFT HEART CATHETERIZATION WITH CORONARY ANGIOGRAM;  Surgeon: Troy Sine, [name];  Location: [facility] CATH LAB;  Service: Cardiovascular;  Laterality: N/A;  . TEE WITHOUT CARDIOVERSION N/A 11/14/2014   Procedure: TRANSESOPHAGEAL ECHOCARDIOGRAM (TEE);  Surgeon: [name], [name];  Location: [place];  Service: Open Heart Surgery;  Laterality: N/A;  . TEMPORARY PACEMAKER INSERTION  11/04/2014   Procedure: TEMPORARY PACEMAKER INSERTION;  Surgeon: Troy Sine, [name];  Location: [facility] CATH LAB;  Service: Cardiovascular;;    No current facility-administered medications for this encounter.    Current Outpatient Medications  Medication Sig Dispense Refill  . acetaminophen (TYLENOL) 500 MG tablet Take 500 mg by mouth every 6 (six) hours as needed for moderate pain.     Marland Kitchen atorvastatin (LIPITOR) 10 MG tablet TAKE 1 TABLET EVERY DAY  AT  6PM (Patient taking differently: TAKE 10 mg TABLET EVERY DAY) 90 tablet 3  . carvedilol (COREG) 25 MG tablet Take 1 tablet (25 mg total) by mouth 2 (two) times daily with a meal. 60 tablet 3  . diltiazem (CARDIZEM CD) 240 MG 24 hr capsule Take 1 capsule (240 mg total) by mouth daily. 30 capsule 4  . febuxostat ([name]) 40 MG tablet Take 40 mg by mouth daily.    . fluticasone (FLONASE) 50 MCG/ACT nasal spray Place 1 spray into both nostrils daily. 16 g 2  . furosemide (LASIX) 40 MG tablet Take 1 tablet (40 mg total) by mouth daily. 30 tablet 6  . magnesium oxide (MAG-OX) 400 (241.3 Mg) MG tablet Take 1 tablet (400 mg total) by mouth 2 (two) times daily. 60 tablet 3  . potassium chloride SA (K-DUR,KLOR-CON) 20 MEQ tablet Take 2 tablets (40 mEq total) by mouth 2 (two) times  daily. 60 tablet 4  . ranitidine (ZANTAC) 150 MG capsule Take 150 mg by mouth daily as needed for heartburn.     . rivaroxaban (XARELTO) 20 MG TABS tablet Take 1 tablet (20 mg total) by mouth daily with supper. 30 tablet 4    Allergies  Allergen Reactions  . Bee Venom Anaphylaxis  . Codeine Nausea And Vomiting  . Shrimp [Shellfish Allergy] Swelling  . Tomato     Pt states it gives her gout    Social History   Socioeconomic History  . Marital status: Widowed    Spouse name: Not on file  . Number of children: Not on file  . Years of education: Not on file  . Highest education level: Not on file  Social Needs  . Financial resource strain: Not on file  . Food insecurity - worry: Not on file  . Food insecurity - inability: Not on file  . Transportation needs - medical:  Not on file  . Transportation needs - non-medical: Not on file  Occupational History  . Not on file  Tobacco Use  . Smoking status: Current Some Day Smoker    Packs/day: 1.00    Years: 32.00    Pack years: 32.00    Types: Cigarettes  . Smokeless tobacco: Never Used  . Tobacco comment: occasional puff here and there - 02/09/15  Substance and Sexual Activity  . Alcohol use: No    Alcohol/week: 0.0 oz  . Drug use: No  . Sexual activity: Not on file  Other Topics Concern  . Not on file  Social History Narrative  . Not on file    Family History  Problem Relation Age of Onset  . Cancer Mother   . Heart disease Father     ROS- All systems are reviewed and negative except as per the HPI above  Physical Exam: Vitals:   08/04/17 1518 08/04/17 1520 08/04/17 1545 08/04/17 1548  BP: 121/90  (!) 126/93   Pulse: 77 (!) 56 79 87  Resp:      Temp:      SpO2: 99% 99% 97% 97%  Weight:      Height:       Wt Readings from Last 3 Encounters:  08/04/17 250 lb (113.4 kg)  08/04/17 225 lb (102.1 kg)  08/02/17 220 lb (99.8 kg)    Labs: Lab Results  Component Value Date   NA 139 08/04/2017   K 3.4 (L)  08/04/2017   CL 110 08/04/2017   CO2 19 (L) 08/04/2017   GLUCOSE 133 (H) 08/04/2017   BUN 27 (H) 08/04/2017   CREATININE 1.38 (H) 08/04/2017   CALCIUM 8.8 (L) 08/04/2017   PHOS 3.0 11/09/2014   MG 1.7 07/29/2017   Lab Results  Component Value Date   INR 1.24 07/26/2017   Lab Results  Component Value Date   CHOL 116 11/12/2014   HDL 34 (L) 11/12/2014   LDLCALC 60 11/12/2014   TRIG 111 11/12/2014     GEN- The patient is well appearing, alert and oriented x 3 today.   Head- normocephalic, atraumatic Eyes-  Sclera clear, conjunctiva pink Ears- hearing intact Oropharynx- clear Neck- supple, no JVP Lymph- no cervical lymphadenopathy Lungs- Deminished ausculation bilaterally, normal work of breathing Heart- Rapid irregular rate and rhythm, no murmurs, rubs or gallops, PMI not laterally displaced GI- soft, NT, ND, + BS Extremities- no clubbing, cyanosis, 2+ lower extremity edema MS- no significant deformity or atrophy Skin- no rash or lesion Psych- euthymic mood, full affect Neuro- strength and sensation are intact  EKG-afib with rvr at 141 bpm with qtc at 512 ms Echo-Study Conclusions  - Left ventricle: Posterior lateral hypokinesis. The cavity size   was normal. Systolic function was normal. The estimated ejection   fraction was in the range of 50% to 55%. Left ventricular   diastolic function parameters were normal. - Aortic valve: There was mild regurgitation. - Mitral valve: There was mild regurgitation. - Left atrium: The atrium was moderately dilated. - Atrial septum: No defect or patent foramen ovale was identified.    Assessment and Plan: 1. Persistent afib with RVR/CHF Pt with recent admission and has not done well since d/c. Compliance issues may be a concern  Pt has not responded to increase in rate control or increase with furosemide with persistent afib with RVR  In the 140's and 10 weight gain since d/c. She is very symptomatic and hospitalization is  needed. I  am also concerned re her new c/o of bleeding from her bottom, " like a period", newly on anticoagualtion  To ER for  admission  Butch Penny C. Mila [name] Afib [facility] [address] [place], [place] [address] [phone]  I have seen and examined this patient with [name] [name].  Agree with above, note added to reflect my findings.  On exam, regular rhythm, no murmurs, lungs clear.  She presented to the atrial fibrillation clinic with shortness of breath and atrial fibrillation with rapid rates.  She has been on anticoagulation since 12/13.  She would need anticoagulation for 3 weeks prior to cardioversion.  Her heart rate is quite fast and she is thus on a diltiazem drip in the emergency room.  [name] [name] restart her home medications tonight with increasing her dose of diltiazem.  [name] [name] [name] start her on diltiazem 60 mg every 6 hours overnight and increased to 360 mg long-acting in the morning.  Her breathing is better now that her heart rate is better controlled.  That being said, her BNP is elevated.  [name] [name] diurese her overnight with IV Lasix.  [name] [name] 08/04/2017 4:01 PM

## 2017-08-04 NOTE — ED Triage Notes (Signed)
Onset shortness of breath 2 months and recently diagnosed with a-fib. Seen at "[facility]" today and Doctor sent patient to the ED for increased shortness of breath. Denies chest pain. Alert answering and following commands appropriate.

## 2017-08-05 DIAGNOSIS — K922 Gastrointestinal hemorrhage, unspecified: Secondary | ICD-10-CM

## 2017-08-05 DIAGNOSIS — I5033 Acute on chronic diastolic (congestive) heart failure: Secondary | ICD-10-CM | POA: Diagnosis not present

## 2017-08-05 DIAGNOSIS — Z951 Presence of aortocoronary bypass graft: Secondary | ICD-10-CM | POA: Diagnosis not present

## 2017-08-05 DIAGNOSIS — I481 Persistent atrial fibrillation: Secondary | ICD-10-CM | POA: Diagnosis present

## 2017-08-05 DIAGNOSIS — F17210 Nicotine dependence, cigarettes, uncomplicated: Secondary | ICD-10-CM | POA: Diagnosis present

## 2017-08-05 DIAGNOSIS — I130 Hypertensive heart and chronic kidney disease with heart failure and stage 1 through stage 4 chronic kidney disease, or unspecified chronic kidney disease: Secondary | ICD-10-CM | POA: Diagnosis present

## 2017-08-05 DIAGNOSIS — E785 Hyperlipidemia, unspecified: Secondary | ICD-10-CM | POA: Diagnosis present

## 2017-08-05 DIAGNOSIS — E876 Hypokalemia: Secondary | ICD-10-CM | POA: Diagnosis present

## 2017-08-05 DIAGNOSIS — J45909 Unspecified asthma, uncomplicated: Secondary | ICD-10-CM | POA: Diagnosis present

## 2017-08-05 DIAGNOSIS — N183 Chronic kidney disease, stage 3 (moderate): Secondary | ICD-10-CM | POA: Diagnosis present

## 2017-08-05 DIAGNOSIS — Z79899 Other long term (current) drug therapy: Secondary | ICD-10-CM | POA: Diagnosis not present

## 2017-08-05 DIAGNOSIS — I5043 Acute on chronic combined systolic (congestive) and diastolic (congestive) heart failure: Secondary | ICD-10-CM | POA: Diagnosis present

## 2017-08-05 DIAGNOSIS — Z885 Allergy status to narcotic agent status: Secondary | ICD-10-CM | POA: Diagnosis not present

## 2017-08-05 DIAGNOSIS — Z7901 Long term (current) use of anticoagulants: Secondary | ICD-10-CM | POA: Diagnosis not present

## 2017-08-05 DIAGNOSIS — I2510 Atherosclerotic heart disease of native coronary artery without angina pectoris: Secondary | ICD-10-CM | POA: Diagnosis present

## 2017-08-05 DIAGNOSIS — I480 Paroxysmal atrial fibrillation: Secondary | ICD-10-CM | POA: Diagnosis present

## 2017-08-05 DIAGNOSIS — K649 Unspecified hemorrhoids: Secondary | ICD-10-CM | POA: Diagnosis present

## 2017-08-05 DIAGNOSIS — I255 Ischemic cardiomyopathy: Secondary | ICD-10-CM | POA: Diagnosis present

## 2017-08-05 LAB — BASIC METABOLIC PANEL
Anion gap: 8 (ref 5–15)
BUN: 29 mg/dL — ABNORMAL HIGH (ref 6–20)
CO2: 22 mmol/L (ref 22–32)
Calcium: 8.9 mg/dL (ref 8.9–10.3)
Chloride: 110 mmol/L (ref 101–111)
Creatinine, Ser: 1.4 mg/dL — ABNORMAL HIGH (ref 0.44–1.00)
GFR calc Af Amer: 42 mL/min — ABNORMAL LOW (ref >60)
GFR calc non Af Amer: 37 mL/min — ABNORMAL LOW (ref >60)
Glucose, Bld: 130 mg/dL — ABNORMAL HIGH (ref 65–99)
Potassium: 4.3 mmol/L (ref 3.5–5.1)
Sodium: 140 mmol/L (ref 135–145)

## 2017-08-05 MED ORDER — FUROSEMIDE 10 MG/ML IJ SOLN
60.0000 mg | Freq: Every day | INTRAMUSCULAR | Status: AC
  Administered 2017-08-05 – 2017-08-06 (×2): 60 mg via INTRAVENOUS
  Filled 2017-08-05 (×2): qty 6

## 2017-08-05 NOTE — Progress Notes (Signed)
Progress Note  Patient Name: [name] [name] Date of Encounter: 08/05/2017  Primary Cardiologist: Curt Bears   Subjective   "I still can't breath"  Inpatient Medications    Scheduled Meds: . atorvastatin  10 mg Oral q1800  . carvedilol  25 mg Oral BID WC  . diltiazem  360 mg Oral Daily  . famotidine  20 mg Oral Daily  . febuxostat  40 mg Oral Daily  . furosemide  60 mg Intravenous Daily  . magnesium oxide  400 mg Oral BID  . potassium chloride SA  40 mEq Oral BID  . rivaroxaban  20 mg Oral Q supper   Continuous Infusions:  PRN Meds: acetaminophen, fluticasone, nitroGLYCERIN, ondansetron (ZOFRAN) IV   Vital Signs    Vitals:   08/04/17 1853 08/04/17 2050 08/05/17 0243 08/05/17 0400  BP: 126/63 (!) 124/97 115/76 (!) 131/100  Pulse: (!) 157 79 78 (!) 129  Resp:      Temp: (!) 96.5 F (35.8 C) (!) 97.5 F (36.4 C) (!) 97.4 F (36.3 C) 98.5 F (36.9 C)  TempSrc: Oral Oral Oral Oral  SpO2: 94% 92%  94%  Weight:      Height:        Intake/Output Summary (Last 24 hours) at 08/05/2017 1059 Last data filed at 08/04/2017 2000 Gross per 24 hour  Intake 380 ml  Output 200 ml  Net 180 ml   Filed Weights   08/04/17 1137  Weight: 250 lb (113.4 kg)    Telemetry    Atrial fib with a controlled/rapid VR - Personally Reviewed  ECG    none - Personally Reviewed  Physical Exam    GEN: No acute distress.   Neck: No JVD Cardiac: IRIRR, no murmurs, rubs, or gallops.  Respiratory: Clear to auscultation bilaterally. GI: Soft, nontender, non-distended  MS: 1+ edema; No deformity. Neuro:  Nonfocal  Psych: Normal affect   Labs    Chemistry Recent Labs  Lab 08/04/17 1139 08/05/17 0250  NA 139 140  K 3.4* 4.3  CL 110 110  CO2 19* 22  GLUCOSE 133* 130*  BUN 27* 29*  CREATININE 1.38* 1.40*  CALCIUM 8.8* 8.9  GFRNONAA 37* 37*  GFRAA 43* 42*  ANIONGAP 10 8     Hematology Recent Labs  Lab 08/04/17 1139  WBC 8.1  RBC 4.71  HGB 13.7  HCT 42.3  MCV  89.8  MCH 29.1  MCHC 32.4  RDW 16.9*  PLT 132*    Cardiac EnzymesNo results for input(s): TROPONINI in the last 168 hours.  Recent Labs  Lab 08/04/17 1158  TROPIPOC 0.00     BNP Recent Labs  Lab 08/04/17 1146  BNP 697.5*     DDimer No results for input(s): DDIMER in the last 168 hours.   Radiology    Dg Chest 2 View  Result Date: 08/04/2017 CLINICAL DATA:  shortness of breath,cardiac hx,shortness of breath 2 months and recently diagnosed with a-fib EXAM: CHEST  2 VIEW COMPARISON:  07/28/2017 FINDINGS: Stable changes from prior cardiac surgery. Cardiac silhouette is mild-to-moderately enlarged. No mediastinal or hilar masses or evidence of adenopathy. Linear opacity noted in the left upper lobe lingula consistent with scarring or atelectasis, similar to the prior study. There are prominent bronchovascular markings. No evidence of pneumonia. No convincing pulmonary edema. No pleural effusion or pneumothorax. Skeletal structures are demineralized but grossly intact. IMPRESSION: 1. No acute cardiopulmonary disease. 2. Cardiomegaly and post cardiac surgery changes stable from the prior chest radiographs. Chronic prominence  of the bronchovascular markings. Electronically Signed   By: Lajean Manes M.D.   On: 08/04/2017 12:12    Cardiac Studies   none  Patient Profile     71 y.o. female admitted with acute diastolic heart failure, LGI bleeding and atrial fib with a RVR.   Assessment & Plan    1. Atrial fib - her rate is still increased. Will continue her AV nodal blocking drugs and uptitrate. 2. Acute on chronic diastolic heart failure - will switch to IV lasix for dyspnea 3. LGI bleeding - none today. Will recheck CBC tomorrow.  4. Disp. - she is still not ready for DC home.   For questions or updates, please contact [address] [place] [address] Please consult [email or web address] for contact info under Cardiology/STEMI.      Signed, [name], MD  08/05/2017, 10:59 AM  Patient ID: [name] [name], female   DOB: 07-Oct-1945, 71 y.o.   MRN: [id number]

## 2017-08-06 ENCOUNTER — Encounter (HOSPITAL_COMMUNITY): Payer: Self-pay | Admitting: Physician Assistant

## 2017-08-06 DIAGNOSIS — K922 Gastrointestinal hemorrhage, unspecified: Secondary | ICD-10-CM | POA: Diagnosis present

## 2017-08-06 DIAGNOSIS — I5031 Acute diastolic (congestive) heart failure: Secondary | ICD-10-CM | POA: Diagnosis present

## 2017-08-06 HISTORY — DX: Gastrointestinal hemorrhage, unspecified: K92.2

## 2017-08-06 LAB — CBC
HCT: 44.2 % (ref 36.0–46.0)
Hemoglobin: 14.2 g/dL (ref 12.0–15.0)
MCH: 29.5 pg (ref 26.0–34.0)
MCHC: 32.1 g/dL (ref 30.0–36.0)
MCV: 91.7 fL (ref 78.0–100.0)
Platelets: 135 10*3/uL — ABNORMAL LOW (ref 150–400)
RBC: 4.82 MIL/uL (ref 3.87–5.11)
RDW: 17.2 % — ABNORMAL HIGH (ref 11.5–15.5)
WBC: 8.5 10*3/uL (ref 4.0–10.5)

## 2017-08-06 MED ORDER — FUROSEMIDE 40 MG PO TABS: 60.0000 mg | ORAL_TABLET | Freq: Every day | ORAL | 6 refills | Status: DC

## 2017-08-06 NOTE — Progress Notes (Signed)
Progress Note  Patient Name: [name] [name] Date of Encounter: 08/06/2017  Primary Cardiologist: Curt Bears  Subjective   "I feel better. Can I go home?"  Inpatient Medications    Scheduled Meds: . atorvastatin  10 mg Oral q1800  . carvedilol  25 mg Oral BID WC  . diltiazem  360 mg Oral Daily  . famotidine  20 mg Oral Daily  . febuxostat  40 mg Oral Daily  . furosemide  60 mg Intravenous Daily  . magnesium oxide  400 mg Oral BID  . potassium chloride SA  40 mEq Oral BID  . rivaroxaban  20 mg Oral Q supper   Continuous Infusions:  PRN Meds: acetaminophen, fluticasone, nitroGLYCERIN, ondansetron (ZOFRAN) IV   Vital Signs    Vitals:   08/05/17 1200 08/05/17 2005 08/06/17 0434 08/06/17 0830  BP: 101/61 113/64 107/89 (!) 133/101  Pulse: 80 90 83 91  Resp:  (!) 22 (!) 22   Temp: (!) 97.4 F (36.3 C) 98.4 F (36.9 C) 98.6 F (37 C) (!) 97.4 F (36.3 C)  TempSrc: Axillary Oral Oral Oral  SpO2: 98% 98% 100% 99%  Weight:   218 lb 9.6 oz (99.2 kg)   Height:        Intake/Output Summary (Last 24 hours) at 08/06/2017 3557 Last data filed at 08/05/2017 1812 Gross per 24 hour  Intake 360 ml  Output 1350 ml  Net -990 ml   Filed Weights   08/04/17 1137 08/06/17 0434  Weight: 250 lb (113.4 kg) 218 lb 9.6 oz (99.2 kg)    Telemetry    Atrial fib - Personally Reviewed  ECG    none - Personally Reviewed  Physical Exam   GEN: No acute distress.   Neck: No JVD Cardiac: IRIRR, no murmurs, rubs, or gallops.  Respiratory: Clear to auscultation bilaterally. GI: Soft, nontender, non-distended  MS: No edema; No deformity. Neuro:  Nonfocal  Psych: Normal affect   Labs    Chemistry Recent Labs  Lab 08/04/17 1139 08/05/17 0250  NA 139 140  K 3.4* 4.3  CL 110 110  CO2 19* 22  GLUCOSE 133* 130*  BUN 27* 29*  CREATININE 1.38* 1.40*  CALCIUM 8.8* 8.9  GFRNONAA 37* 37*  GFRAA 43* 42*  ANIONGAP 10 8     Hematology Recent Labs  Lab 08/04/17 1139  08/06/17 0016  WBC 8.1 8.5  RBC 4.71 4.82  HGB 13.7 14.2  HCT 42.3 44.2  MCV 89.8 91.7  MCH 29.1 29.5  MCHC 32.4 32.1  RDW 16.9* 17.2*  PLT 132* 135*    Cardiac EnzymesNo results for input(s): TROPONINI in the last 168 hours.  Recent Labs  Lab 08/04/17 1158  TROPIPOC 0.00     BNP Recent Labs  Lab 08/04/17 1146  BNP 697.5*     DDimer No results for input(s): DDIMER in the last 168 hours.   Radiology    Dg Chest 2 View  Result Date: 08/04/2017 CLINICAL DATA:  shortness of breath,cardiac hx,shortness of breath 2 months and recently diagnosed with a-fib EXAM: CHEST  2 VIEW COMPARISON:  07/28/2017 FINDINGS: Stable changes from prior cardiac surgery. Cardiac silhouette is mild-to-moderately enlarged. No mediastinal or hilar masses or evidence of adenopathy. Linear opacity noted in the left upper lobe lingula consistent with scarring or atelectasis, similar to the prior study. There are prominent bronchovascular markings. No evidence of pneumonia. No convincing pulmonary edema. No pleural effusion or pneumothorax. Skeletal structures are demineralized but grossly intact. IMPRESSION: 1.  No acute cardiopulmonary disease. 2. Cardiomegaly and post cardiac surgery changes stable from the prior chest radiographs. Chronic prominence of the bronchovascular markings. Electronically Signed   By: Lajean Manes M.D.   On: 08/04/2017 12:12    Cardiac Studies   none  Patient Profile     71 y.o. female admitted with atrial fib and chf and gi bleeding  Assessment & Plan    1. Acute on chronic CHF - her dyspnea is much improved with IV lasix. Will DC home after lasix today. She will need to go home on 60 mg orally a day. Continue all of her other meds. 2. LGI bleeding  - I suspect hemorrhoids as her H/H is normal. She may need GI outpatient followup. 3. Persistent atrial fib - her rate is well controlled. She will continue her current meds. 4. Disp. - DC home this afternoon. Followup with  Dr. Curt Bears in 2-3 weeks.  For questions or updates, please contact [place] Please consult [email or web address] for contact info under Cardiology/STEMI.      Signed, [name], MD  08/06/2017, 9:23 AM  Patient ID: [name] [name], female   DOB: 1946-04-03, 71 y.o.   MRN: [id number]

## 2017-08-06 NOTE — Progress Notes (Signed)
Patient is ready for discharge. Patient has all of her belongings and all questions have been answered. Patient has been discharged from telemetry patient IV has been DC'd. Patient will leave via wheelchair and go through entrance.

## 2017-08-06 NOTE — Discharge Summary (Signed)
Discharge Summary    Patient ID: [name] [name],  MRN: [id number], DOB/AGE: Apr 24, 1946 71 y.o.  Admit date: 08/04/2017 Discharge date: 08/06/2017  Primary Care Provider: [name] Primary Cardiologist: Dr Curt Bears, [facility]  Discharge Diagnoses    Principal Problem:   Atrial fibrillation [facility]) Active Problems:   Hypokalemia   CKD (chronic kidney disease), stage III ([place])   Acute on chronic diastolic heart failure (HCC)   LGI bleed, likely hemorrhoids   Allergies Allergies  Allergen Reactions  . Bee Venom Anaphylaxis  . Codeine Nausea And Vomiting  . Shrimp [Shellfish Allergy] Swelling  . Tomato     Pt states it gives her gout    Diagnostic Studies/Procedures    Dg Chest 2 View  Result Date: 08/04/2017 CLINICAL DATA:  shortness of breath,cardiac hx,shortness of breath 2 months and recently diagnosed with a-fib EXAM: CHEST  2 VIEW COMPARISON:  07/28/2017 FINDINGS: Stable changes from prior cardiac surgery. Cardiac silhouette is mild-to-moderately enlarged. No mediastinal or hilar masses or evidence of adenopathy. Linear opacity noted in the left upper lobe lingula consistent with scarring or atelectasis, similar to the prior study. There are prominent bronchovascular markings. No evidence of pneumonia. No convincing pulmonary edema. No pleural effusion or pneumothorax. Skeletal structures are demineralized but grossly intact. IMPRESSION: 1. No acute cardiopulmonary disease. 2. Cardiomegaly and post cardiac surgery changes stable from the prior chest radiographs. Chronic prominence of the bronchovascular markings. Electronically Signed   By: Lajean Manes M.D.   On: 08/04/2017 12:12   _____________   History of Present Illness     hypertension with multiple medication adjustments,paroxsymal atrial fibrillation,CHADS VASC Score 5, status post left atrial appendage clipping, coronary artery disease status post CABG, hyperlipidemia, ischemic cardiomyopathy, with normal  LV systolic function per echo May 2018  Admitted 12/12-12/15 w/ rapid atrial fib, started on Xarelto, Coreg increased, ?Tikosyn, referral to afib clinic. Afib clinic 12/19, wt up 5 lbs, HR 129, ?med compliance, Coreg increased Afib clinic 12/21, pt feeling worse, wt up another 5 lbs, rectal bleeding, pt admitted to [facility].   Hospital Course     Consultants: none   1. Acute on chronic CHF - Her dyspnea is much improved with IV lasix. Will DC home after IV Lasix today. She will need to go home on 60 mg orally a day. Continue all of her other meds. Admit wt 250, d/c wt 218 lbs.  2. LGI bleeding  - I suspect hemorrhoids as her H/H is normal. She may need GI outpatient followup. Continue Xarelto  3. Persistent atrial fib - Her heart rate is well controlled. She will continue her current meds.  4. Disp. - DC home this afternoon. Followup with Dr. Curt Bears in 2-3 weeks. Consider DCCV in 3 weeks.  5. Abnormal renal function, CKD III: 01/2017 BUN/Cr were 26/1.20 w/ GFR 44. After diuresis, BUN/CR 29/1.40. Continue to follow closely as outpt.  6. Hypokalemia: Pt was on Kdur 40 meq bid at home with Lasix 40 mg qd, increased to bid for 2 days prior to admission. K+ was 3.4 on admit. Continue same dose, Lasix now 60 mg qd.   Plan: Keep appt 12/27 in the cardiology clinic for CHF management and ck BMET. Then f/u with Dr Curt Bears or Dr Rayann Heman for afib management.   _____________  Discharge Vitals Blood pressure (!) 133/101, pulse 91, temperature (!) 97.4 F (36.3 C), temperature source Oral, resp. rate (!) 22, height 5' 7.5" (1.715 m), weight 218 lb 9.6 oz (  99.2 kg), SpO2 99 %.  Filed Weights   08/04/17 1137 08/06/17 0434  Weight: 250 lb (113.4 kg) 218 lb 9.6 oz (99.2 kg)    Labs & Radiologic Studies    CBC Recent Labs    08/04/17 1139 08/06/17 0016  WBC 8.1 8.5  NEUTROABS 5.4  --   HGB 13.7 14.2  HCT 42.3 44.2  MCV 89.8 91.7  PLT 132* 299*   Basic Metabolic Panel Recent Labs     08/04/17 1139 08/05/17 0250  NA 139 140  K 3.4* 4.3  CL 110 110  CO2 19* 22  GLUCOSE 133* 130*  BUN 27* 29*  CREATININE 1.38* 1.40*  CALCIUM 8.8* 8.9   _____________  Dg Chest 2 View  Result Date: 08/04/2017 CLINICAL DATA:  shortness of breath,cardiac hx,shortness of breath 2 months and recently diagnosed with a-fib EXAM: CHEST  2 VIEW COMPARISON:  07/28/2017 FINDINGS: Stable changes from prior cardiac surgery. Cardiac silhouette is mild-to-moderately enlarged. No mediastinal or hilar masses or evidence of adenopathy. Linear opacity noted in the left upper lobe lingula consistent with scarring or atelectasis, similar to the prior study. There are prominent bronchovascular markings. No evidence of pneumonia. No convincing pulmonary edema. No pleural effusion or pneumothorax. Skeletal structures are demineralized but grossly intact. IMPRESSION: 1. No acute cardiopulmonary disease. 2. Cardiomegaly and post cardiac surgery changes stable from the prior chest radiographs. Chronic prominence of the bronchovascular markings. Electronically Signed   By: Lajean Manes M.D.   On: 08/04/2017 12:12   Disposition   Pt is being discharged home today in good condition.  Follow-up Plans & Appointments    Follow-up Information    Erma Heritage, PA-C Follow up on 08/10/2017.   Specialties:  Physician Assistant, Cardiology Why:  Please arrive at 11:15 am for an 11:30 am appt.  Contact information: [address] [place] [address] [phone]          Discharge Instructions    Diet - low sodium heart healthy   Complete by:  As directed    Increase activity slowly   Complete by:  As directed       Discharge Medications   Allergies as of 08/06/2017      Reactions   Bee Venom Anaphylaxis   Codeine Nausea And Vomiting   Shrimp [shellfish Allergy] Swelling   Tomato    Pt states it gives her gout      Medication List    TAKE these medications   acetaminophen  500 MG tablet Commonly known as:  TYLENOL Take 500 mg by mouth every 6 (six) hours as needed for moderate pain.   atorvastatin 10 MG tablet Commonly known as:  LIPITOR TAKE 1 TABLET EVERY DAY  AT  6PM What changed:  See the new instructions.   carvedilol 25 MG tablet Commonly known as:  COREG Take 1 tablet (25 mg total) by mouth 2 (two) times daily with a meal.   diltiazem 240 MG 24 hr capsule Commonly known as:  CARDIZEM CD Take 1 capsule (240 mg total) by mouth daily.   febuxostat 40 MG tablet Commonly known as:  [name] Take 40 mg by mouth daily.   fluticasone 50 MCG/ACT nasal spray Commonly known as:  FLONASE Place 1 spray into both nostrils daily. What changed:    when to take this  reasons to take this   furosemide 40 MG tablet Commonly known as:  LASIX Take 1.5 tablets (60 mg total) by mouth daily. What  changed:  how much to take   magnesium oxide 400 (241.3 Mg) MG tablet Commonly known as:  MAG-OX Take 1 tablet (400 mg total) by mouth 2 (two) times daily.   potassium chloride SA 20 MEQ tablet Commonly known as:  K-DUR,KLOR-CON Take 2 tablets (40 mEq total) by mouth 2 (two) times daily.   ranitidine 150 MG capsule Commonly known as:  ZANTAC Take 150 mg by mouth daily as needed for heartburn.   rivaroxaban 20 MG Tabs tablet Commonly known as:  XARELTO Take 1 tablet (20 mg total) by mouth daily with supper.         Outstanding Labs/Studies   None  Duration of Discharge Encounter   Greater than 30 minutes including physician time.  Alcario Drought Barrett NP 08/06/2017, 5:33 PM  EP Attending  Patient seen and examined. Agree with above. She is stable for DC home. F/U as above.  Mikle Bosworth.D.

## 2017-08-09 NOTE — Progress Notes (Deleted)
Cardiology Office Note    Date:  08/09/2017   ID:  [name], [name] Jun 27, 1946, MRN [id number]  PCP:  [name] Low, MD  Cardiologist: Dr. Debara Pickett   No chief complaint on file.   History of Present Illness:    [name] [name] is a 71 y.o. female with past medical history of CAD (s/p CABG in 2016 with LIMA-LAD and SVG-OM), PAF (s/p clipping of left atrial appendage), chronic combined systolic and diastolic CHF (EF previously 35-40%, improved to 50-55% by echo in 12/2016), HTN, HLD, and arthritis who presents to the office today for hospital follow-up.   She was recently admitted to [facility] from 12/12 - 07/29/2017 for evaluation of worsening shortness of breath.  Was noted to be in atrial fibrillation with RVR upon arrival and started on IV Cardizem.  This was switched to Cardizem CD 240 mg daily along with her carvedilol being further titrated to 18.75mg  BID.  She was also started on Xarelto 20 mg daily for anticoagulation.   She followed up in the atrial fibrillation clinic as an outpatient where she underwent further dose adjustment of her carvedilol to 25 mg twice daily and Lasix increased to 40 mg twice daily.  She reported missing doses of her Xarelto.  At the time of follow-up on 12/21, her symptoms had continued to worsen, therefore she was referred back to [facility] ED.  She was admitted for further evaluation and received multiple doses of IV Lasix.  Weight was stable at 218 lbs at the time of discharge. Coreg was titrated to 25mg  BID and Cardizem CD to 360mg  daily (yet listed as 240mg  daily in her discharge summary).     Past Medical History:  Diagnosis Date  . Arthritis   . Asthma   . Cardiomyopathy, ischemic    LVEF 50-55% as of May 2018  . Chronic combined systolic and diastolic heart failure ([place])   . Hypertension   . Left main coronary artery disease 11/04/2014  . LGI bleed 08/06/2017  . PAF (paroxysmal atrial fibrillation) ([place])   . S/P CABG x 2 with clipping of  LA appendage 11/14/2014   LIMA to LAD, SVG to OM, EVH via right thigh    Past Surgical History:  Procedure Laterality Date  . CARDIOVERSION N/A 11/18/2014   Procedure: CARDIOVERSION;  Surgeon: [name], MD;  Location: [place];  Service: Cardiovascular;  Laterality: N/A;  . CLIPPING OF ATRIAL APPENDAGE N/A 11/14/2014   Procedure: CLIPPING OF ATRIAL APPENDAGE;  Surgeon: [name], MD;  Location: [place];  Service: Open Heart Surgery;  Laterality: N/A;  . CORONARY ARTERY BYPASS GRAFT N/A 11/14/2014   Procedure: CORONARY ARTERY BYPASS GRAFTING (CABG)TIMES 2 USING LEFT INTERNAL MAMMARY ARTERY AND RIGHT SAPHENOUS VEIN HARVESTED ENDOSCOPICALLY;  Surgeon: [name], MD;  Location: [place];  Service: Open Heart Surgery;  Laterality: N/A;  . LEFT HEART CATHETERIZATION WITH CORONARY ANGIOGRAM N/A 11/04/2014   Procedure: LEFT HEART CATHETERIZATION WITH CORONARY ANGIOGRAM;  Surgeon: Troy Sine, MD;  Location: [facility] CATH LAB;  Service: Cardiovascular;  Laterality: N/A;  . TEE WITHOUT CARDIOVERSION N/A 11/14/2014   Procedure: TRANSESOPHAGEAL ECHOCARDIOGRAM (TEE);  Surgeon: [name], MD;  Location: [place];  Service: Open Heart Surgery;  Laterality: N/A;  . TEMPORARY PACEMAKER INSERTION  11/04/2014   Procedure: TEMPORARY PACEMAKER INSERTION;  Surgeon: Troy Sine, MD;  Location: [facility] CATH LAB;  Service: Cardiovascular;;    Current Medications: Outpatient Medications Prior to Visit  Medication Sig  Dispense Refill  . acetaminophen (TYLENOL) 500 MG tablet Take 500 mg by mouth every 6 (six) hours as needed for moderate pain.     Marland Kitchen atorvastatin (LIPITOR) 10 MG tablet TAKE 1 TABLET EVERY DAY  AT  6PM (Patient taking differently: TAKE 10 mg TABLET EVERY DAY) 90 tablet 3  . carvedilol (COREG) 25 MG tablet Take 1 tablet (25 mg total) by mouth 2 (two) times daily with a meal. 60 tablet 3  . diltiazem (CARDIZEM CD) 240 MG 24 hr capsule Take 1 capsule (240 mg total) by mouth daily. 30 capsule 4  . febuxostat  ([name]) 40 MG tablet Take 40 mg by mouth daily.    . fluticasone (FLONASE) 50 MCG/ACT nasal spray Place 1 spray into both nostrils daily. (Patient taking differently: Place 1 spray into both nostrils daily as needed for allergies. ) 16 g 2  . furosemide (LASIX) 40 MG tablet Take 1.5 tablets (60 mg total) by mouth daily. 45 tablet 6  . magnesium oxide (MAG-OX) 400 (241.3 Mg) MG tablet Take 1 tablet (400 mg total) by mouth 2 (two) times daily. 60 tablet 3  . potassium chloride SA (K-DUR,KLOR-CON) 20 MEQ tablet Take 2 tablets (40 mEq total) by mouth 2 (two) times daily. 60 tablet 4  . ranitidine (ZANTAC) 150 MG capsule Take 150 mg by mouth daily as needed for heartburn.     . rivaroxaban (XARELTO) 20 MG TABS tablet Take 1 tablet (20 mg total) by mouth daily with supper. 30 tablet 4   No facility-administered medications prior to visit.      Allergies:   Bee venom; Codeine; Shrimp [shellfish allergy]; and Tomato   Social History   Socioeconomic History  . Marital status: Widowed    Spouse name: Not on file  . Number of children: Not on file  . Years of education: Not on file  . Highest education level: Not on file  Social Needs  . Financial resource strain: Not on file  . Food insecurity - worry: Not on file  . Food insecurity - inability: Not on file  . Transportation needs - medical: Not on file  . Transportation needs - non-medical: Not on file  Occupational History  . Not on file  Tobacco Use  . Smoking status: Current Some Day Smoker    Packs/day: 1.00    Years: 32.00    Pack years: 32.00    Types: Cigarettes  . Smokeless tobacco: Never Used  . Tobacco comment: occasional puff here and there - 02/09/15  Substance and Sexual Activity  . Alcohol use: No    Alcohol/week: 0.0 oz  . Drug use: No  . Sexual activity: Not on file  Other Topics Concern  . Not on file  Social History Narrative  . Not on file     Family History:  [name] ***family history includes Cancer  in her mother; Heart disease in her father.   Review of Systems:   Please see the history of present illness.     General:  No chills, fever, night sweats or weight changes.  Cardiovascular:  No chest pain, dyspnea on exertion, edema, orthopnea, palpitations, paroxysmal nocturnal dyspnea. Dermatological: No rash, lesions/masses Respiratory: No cough, dyspnea Urologic: No hematuria, dysuria Abdominal:   No nausea, vomiting, diarrhea, bright red blood per rectum, melena, or hematemesis Neurologic:  No visual changes, wkns, changes in mental status. All other systems reviewed and are otherwise negative except as noted above.   Physical Exam:  VS:  There were no vitals taken for this visit.   General: Well developed, well nourished,female appearing in no acute distress. Head: Normocephalic, atraumatic, sclera non-icteric, no xanthomas, nares are without discharge.  Neck: No carotid bruits. JVD not elevated.  Lungs: Respirations regular and unlabored, without wheezes or rales.  Heart: ***Regular rate and rhythm. No S3 or S4.  No murmur, no rubs, or gallops appreciated. Abdomen: Soft, non-tender, non-distended with normoactive bowel sounds. No hepatomegaly. No rebound/guarding. No obvious abdominal masses. Msk:  Strength and tone appear normal for age. No joint deformities or effusions. Extremities: No clubbing or cyanosis. No edema.  Distal pedal pulses are 2+ bilaterally. Neuro: Alert and oriented X 3. Moves all extremities spontaneously. No focal deficits noted. Psych:  Responds to questions appropriately with a normal affect. Skin: No rashes or lesions noted  Wt Readings from Last 3 Encounters:  08/06/17 218 lb 9.6 oz (99.2 kg)  08/04/17 225 lb (102.1 kg)  08/02/17 220 lb (99.8 kg)        Studies/Labs Reviewed:   EKG:  EKG is*** ordered today.  The ekg ordered today demonstrates ***  Recent Labs: 01/30/2017: TSH 1.142 07/26/2017: ALT 53 07/29/2017: Magnesium  1.7 08/04/2017: B Natriuretic Peptide 697.5 08/05/2017: BUN 29; Creatinine, Ser 1.40; Potassium 4.3; Sodium 140 08/06/2017: Hemoglobin 14.2; Platelets 135   Lipid Panel    Component Value Date/Time   CHOL 116 11/12/2014 0432   TRIG 111 11/12/2014 0432   HDL 34 (L) 11/12/2014 0432   CHOLHDL 3.4 11/12/2014 0432   VLDL 22 11/12/2014 0432   LDLCALC 60 11/12/2014 0432    Additional studies/ records that were reviewed today include:   Echocardiogram: 12/2016 Study Conclusions  - Left ventricle: Posterior lateral hypokinesis. The cavity size   was normal. Systolic function was normal. The estimated ejection   fraction was in the range of 50% to 55%. Left ventricular   diastolic function parameters were normal. - Aortic valve: There was mild regurgitation. - Mitral valve: There was mild regurgitation. - Left atrium: The atrium was moderately dilated. - Atrial septum: No defect or patent foramen ovale was identified.  Assessment:    No diagnosis found.   Plan:   In order of problems listed above:  1. ***    Medication Adjustments/Labs and Tests Ordered: Current medicines are reviewed at length with the patient today.  Concerns regarding medicines are outlined above.  Medication changes, Labs and Tests ordered today are listed in the Patient Instructions below. There are no Patient Instructions on file for this visit.   Signed, Erma Heritage, PA-C  08/09/2017 2:54 PM    [facility] [address], [place], [place]  [address] Phone: [phone]; Fax: [phone]  [address] [place], [place] [address] Phone: [phone]

## 2017-08-10 ENCOUNTER — Ambulatory Visit: Payer: PPO | Admitting: Student

## 2017-08-11 ENCOUNTER — Encounter: Payer: Self-pay | Admitting: *Deleted

## 2017-08-14 ENCOUNTER — Ambulatory Visit: Payer: PPO | Admitting: Internal Medicine

## 2017-08-14 ENCOUNTER — Telehealth: Payer: Self-pay | Admitting: Internal Medicine

## 2017-08-14 NOTE — Progress Notes (Signed)
Cardiology Office Note   Date:  08/16/2017   ID:  [name], [name] [birth year], MRN [id number] Atrial fib PCP:  [name], MD  Cardiologist: [name]/Dr. Camitz  Chief Complaint  Patient presents with  . Follow-up    sob, swelling feet  . Atrial Fibrillation     History of Present Illness: [name] [name] is a 71 y.o. female who presents for posthospitalization follow-up after admission for rapid atrial fibrillation.  The patient was seen on consultation by Dr. Reggy Eye, and is followed in the A. fib clinic by Roderic Palau, NP.  On admission, the patient's heart rate was 129 bpm.  There was question about medical compliance.   Patient was found to have acute on chronic CHF, and was treated with IV Lasix diuresis.  The patient was sent home on 60 mg daily.  Discharge weight was 218 pounds.  Patient was noted to have some lower GI bleeding which was suspected to be the result of hemorrhoids.  The patient was not found to be anemic.  Patient was continued on Xarelto.  Twice daily, and the patient was continued on diltiazem 240 mg daily.  She has some dementia, and is wheelchair-bound.  She is accompanied by her daughter.  The patient has not taken any of her medications prior to this office visit.  She continues to complain of mild bleeding which she notices "a.dot or 2" in her underwear.  She denies worsening breathing dizziness or chest pain.  Her daughter has noticed that she is stop breathing sitting up in her wheelchair on several occasions but will wake up quickly and take deep breaths.  She has not had any studies for obstructive sleep apnea.  Past Medical History:  Diagnosis Date  . Arthritis   . Asthma   . Cardiomyopathy, ischemic    LVEF 50-55% as of May 2018  . Chronic combined systolic and diastolic heart failure ([place])   . Hypertension   . Left main coronary artery disease 11/04/2014  . LGI bleed 08/06/2017  . PAF (paroxysmal atrial fibrillation) ([place])   . S/P  CABG x 2 with clipping of LA appendage 11/14/2014   LIMA to LAD, SVG to OM, EVH via right thigh    Past Surgical History:  Procedure Laterality Date  . CARDIOVERSION N/A 11/18/2014   Procedure: CARDIOVERSION;  Surgeon: [name], MD;  Location: [place];  Service: Cardiovascular;  Laterality: N/A;  . CLIPPING OF ATRIAL APPENDAGE N/A 11/14/2014   Procedure: CLIPPING OF ATRIAL APPENDAGE;  Surgeon: [name], MD;  Location: [place];  Service: Open Heart Surgery;  Laterality: N/A;  . CORONARY ARTERY BYPASS GRAFT N/A 11/14/2014   Procedure: CORONARY ARTERY BYPASS GRAFTING (CABG)TIMES 2 USING LEFT INTERNAL MAMMARY ARTERY AND RIGHT SAPHENOUS VEIN HARVESTED ENDOSCOPICALLY;  Surgeon: [name], MD;  Location: [place];  Service: Open Heart Surgery;  Laterality: N/A;  . LEFT HEART CATHETERIZATION WITH CORONARY ANGIOGRAM N/A 11/04/2014   Procedure: LEFT HEART CATHETERIZATION WITH CORONARY ANGIOGRAM;  Surgeon: Troy Sine, MD;  Location: [facility] CATH LAB;  Service: Cardiovascular;  Laterality: N/A;  . TEE WITHOUT CARDIOVERSION N/A 11/14/2014   Procedure: TRANSESOPHAGEAL ECHOCARDIOGRAM (TEE);  Surgeon: [name], MD;  Location: [place];  Service: Open Heart Surgery;  Laterality: N/A;  . TEMPORARY PACEMAKER INSERTION  11/04/2014   Procedure: TEMPORARY PACEMAKER INSERTION;  Surgeon: Troy Sine, MD;  Location: [facility] CATH LAB;  Service: Cardiovascular;;     Current Outpatient Medications  Medication Sig  Dispense Refill  . acetaminophen (TYLENOL) 500 MG tablet Take 500 mg by mouth every 6 (six) hours as needed for moderate pain.     . carvedilol (COREG) 25 MG tablet Take 1 tablet (25 mg total) by mouth 2 (two) times daily with a meal. 60 tablet 3  . diltiazem (CARDIZEM CD) 240 MG 24 hr capsule Take 1 capsule (240 mg total) by mouth daily. 30 capsule 4  . febuxostat ([name]) 40 MG tablet Take 40 mg by mouth daily as needed.    . fluticasone (FLONASE) 50 MCG/ACT nasal spray Place 1 spray into both nostrils  daily. (Patient taking differently: Place 1 spray into both nostrils daily as needed for allergies. ) 16 g 2  . furosemide (LASIX) 40 MG tablet Take 40 mg twice a day 90 tablet 3  . magnesium oxide (MAG-OX) 400 (241.3 Mg) MG tablet Take 1 tablet (400 mg total) by mouth 2 (two) times daily. 60 tablet 3  . potassium chloride SA (K-DUR,KLOR-CON) 20 MEQ tablet Take 2 tablets (40 mEq total) by mouth 2 (two) times daily. 60 tablet 4  . ranitidine (ZANTAC) 150 MG capsule Take 150 mg by mouth daily as needed for heartburn.     . rivaroxaban (XARELTO) 20 MG TABS tablet Take 1 tablet (20 mg total) by mouth daily with supper. 30 tablet 4   No current facility-administered medications for this visit.     Allergies:   Bee venom; Codeine; Shrimp [shellfish allergy]; and Tomato    Social History:  The patient  reports that she has been smoking cigarettes.  She has a 32.00 pack-year smoking history. she has never used smokeless tobacco. She reports that she does not drink alcohol or use drugs.   Family History:  [name] family history includes Cancer in her mother; Heart disease in her father.    ROS: All other systems are reviewed and negative. Unless otherwise mentioned in H&P    PHYSICAL EXAM: VS:  Pulse (!) 117   Ht 5' 7.5" (1.715 m)   Wt 222 lb 12.8 oz (101.1 kg)   BMI 34.38 kg/m  , BMI Body mass index is 34.38 kg/m. GEN: Well nourished, well developed, in no acute distress  HEENT: normal  Neck: no JVD, carotid bruits, or masses Cardiac: 1/6 systolic murmur, RRR;, rubs, or gallops, dependent edema is noted. Respiratory:  Clear to auscultation bilaterally, normal work of breathing GI: soft, nontender, nondistended, + BS MS: no deformity or atrophy  Skin: warm and dry, no rash Neuro:  Strength and sensation are intact Psych: euthymic mood, full affect   EKG: Atrial fibrillation with LVH and frequent PVCs.  Heart rate 117 bpm Recent Labs: 01/30/2017: TSH 1.142 07/26/2017: ALT  53 07/29/2017: Magnesium 1.7 08/04/2017: B Natriuretic Peptide 697.5 08/05/2017: BUN 29; Creatinine, Ser 1.40; Potassium 4.3; Sodium 140 08/06/2017: Hemoglobin 14.2; Platelets 135    Lipid Panel    Component Value Date/Time   CHOL 116 11/12/2014 0432   TRIG 111 11/12/2014 0432   HDL 34 (L) 11/12/2014 0432   CHOLHDL 3.4 11/12/2014 0432   VLDL 22 11/12/2014 0432   LDLCALC 60 11/12/2014 0432      Wt Readings from Last 3 Encounters:  08/16/17 222 lb 12.8 oz (101.1 kg)  08/06/17 218 lb 9.6 oz (99.2 kg)  08/04/17 225 lb (102.1 kg)      Other studies Reviewed: Echocardiogram 02/04/17  Left ventricle: Posterior lateral hypokinesis. The cavity size   was normal. Systolic function was normal. The estimated  ejection   fraction was in the range of 50% to 55%. Left ventricular   diastolic function parameters were normal. - Aortic valve: There was mild regurgitation. - Mitral valve: There was mild regurgitation. - Left atrium: The atrium was moderately dilated. - Atrial septum: No defect or patent foramen ovale was identified.  ASSESSMENT AND PLAN:  1.  Persistent atrial fibrillation: Heart rate is not well controlled on this office visit.  She has not taken any of her medications this morning.  She is complaining of some spotting in her underwear on occasion.  She denies any frank bleeding or epistaxis.  We will continue Xarelto.  I have reviewed her CBC and it was found to be normal with the exception of some mild thrombocytopenia with platelets of 136.  She is advised to follow-up with PCP concerning ongoing management of this.  In the interim she will continue her current medication regimen and she is reminded to please take medicines prior to coming to the office to ascertain their effectiveness.  She will continue to follow in the A. fib clinic.  2.  Chronic dyspnea: There is some concern about obstructive sleep apnea.  Her daughter also states that she sees her fall asleep during  the day and sometimes stop breathing but wakes up very quickly when this occurs.  Would refer her to primary care to have a sleep study completed if this does not occur by the time we see her next we will order this.  3.  Hypertension: Blood pressures currently well controlled.  We will not make any changes in her regimen at this time.   Current medicines are reviewed at length with the patient today.    Labs/ tests ordered today include: None  Phill Myron. [place] Pugh, ANP, AACC   08/16/2017 11:10 AM    [facility] [address], [place], [place] [address] Phone: [phone]; Fax: [phone]

## 2017-08-14 NOTE — Telephone Encounter (Signed)
Returned call to patient's daughter Olin Hauser.She stated mother has increased swelling in both feet since this past Friday.Stated she is sob.Mother is unsure of how she is taking her medications.Stated she has a friend who has been fixing her pill box.She is taking Lasix 40 mg daily.Spoke to Dr.Hilty he advised to take Lasix 40 mg twice a day.Appointment scheduled with Jory Sims PA 08/16/17 at 10:30 am.Advised to bring all medications to appointment.Advised to go to ED if needed.

## 2017-08-14 NOTE — Telephone Encounter (Signed)
New message  Per patient daughter calling   Pt c/o swelling: STAT is pt has developed SOB within 24 hours  1) How much weight have you gained and in what time span? No  2) If swelling, where is the swelling located? feet  3) Are you currently taking a fluid pill? yes  4) Are you currently SOB? Yes last 2 months  5) Do you have a log of your daily weights (if so, list)? no  6) Have you gained 3 pounds in a day or 5 pounds in a week? yes  7) Have you traveled recently? no

## 2017-08-16 ENCOUNTER — Encounter: Payer: Self-pay | Admitting: Adult Health

## 2017-08-16 ENCOUNTER — Ambulatory Visit: Payer: PPO | Admitting: Adult Health

## 2017-08-16 VITALS — BP 122/76 | HR 117 | Ht 67.5 in | Wt 222.8 lb

## 2017-08-16 DIAGNOSIS — I481 Persistent atrial fibrillation: Secondary | ICD-10-CM

## 2017-08-16 DIAGNOSIS — I4819 Other persistent atrial fibrillation: Secondary | ICD-10-CM

## 2017-08-16 DIAGNOSIS — I10 Essential (primary) hypertension: Secondary | ICD-10-CM

## 2017-08-16 MED ORDER — ATORVASTATIN CALCIUM 10 MG PO TABS: ORAL_TABLET | ORAL | 3 refills | Status: DC

## 2017-08-16 NOTE — Patient Instructions (Signed)
Medication Instructions:  NO CHANGES-Your physician recommends that you continue on your current medications as directed. Please refer to the Current Medication list given to you today.  If you need a refill on your cardiac medications before your next appointment, please call your pharmacy.  Follow-Up: Your physician wants you to follow-up in: 3 MONTHS WITH DR HILTY.   Thank you for choosing [facility]!!

## 2017-08-21 DIAGNOSIS — I10 Essential (primary) hypertension: Secondary | ICD-10-CM | POA: Diagnosis not present

## 2017-08-21 DIAGNOSIS — G479 Sleep disorder, unspecified: Secondary | ICD-10-CM | POA: Diagnosis not present

## 2017-08-21 DIAGNOSIS — I25810 Atherosclerosis of coronary artery bypass graft(s) without angina pectoris: Secondary | ICD-10-CM | POA: Diagnosis not present

## 2017-08-21 DIAGNOSIS — I4891 Unspecified atrial fibrillation: Secondary | ICD-10-CM | POA: Diagnosis not present

## 2017-08-21 DIAGNOSIS — I509 Heart failure, unspecified: Secondary | ICD-10-CM | POA: Diagnosis not present

## 2017-08-21 DIAGNOSIS — E7800 Pure hypercholesterolemia, unspecified: Secondary | ICD-10-CM | POA: Diagnosis not present

## 2017-08-21 DIAGNOSIS — B372 Candidiasis of skin and nail: Secondary | ICD-10-CM | POA: Diagnosis not present

## 2017-08-21 DIAGNOSIS — N183 Chronic kidney disease, stage 3 (moderate): Secondary | ICD-10-CM | POA: Diagnosis not present

## 2017-09-05 ENCOUNTER — Encounter (HOSPITAL_COMMUNITY): Payer: Self-pay | Admitting: Emergency Medicine

## 2017-09-05 ENCOUNTER — Emergency Department (HOSPITAL_COMMUNITY): Payer: PPO

## 2017-09-05 ENCOUNTER — Inpatient Hospital Stay (HOSPITAL_COMMUNITY)
Admission: EM | Admit: 2017-09-05 | Discharge: 2017-09-24 | DRG: 308 | Disposition: A | Discharge status: Skilled Nursing Facility | Payer: PPO | Attending: Internal Medicine | Admitting: Internal Medicine

## 2017-09-05 DIAGNOSIS — N183 Chronic kidney disease, stage 3 unspecified: Secondary | ICD-10-CM | POA: Diagnosis present

## 2017-09-05 DIAGNOSIS — Z7901 Long term (current) use of anticoagulants: Secondary | ICD-10-CM

## 2017-09-05 DIAGNOSIS — I509 Heart failure, unspecified: Secondary | ICD-10-CM

## 2017-09-05 DIAGNOSIS — R1319 Other dysphagia: Secondary | ICD-10-CM | POA: Diagnosis not present

## 2017-09-05 DIAGNOSIS — E876 Hypokalemia: Secondary | ICD-10-CM | POA: Diagnosis present

## 2017-09-05 DIAGNOSIS — E669 Obesity, unspecified: Secondary | ICD-10-CM | POA: Diagnosis present

## 2017-09-05 DIAGNOSIS — I493 Ventricular premature depolarization: Secondary | ICD-10-CM | POA: Diagnosis not present

## 2017-09-05 DIAGNOSIS — I469 Cardiac arrest, cause unspecified: Secondary | ICD-10-CM

## 2017-09-05 DIAGNOSIS — I5043 Acute on chronic combined systolic (congestive) and diastolic (congestive) heart failure: Secondary | ICD-10-CM | POA: Diagnosis present

## 2017-09-05 DIAGNOSIS — I214 Non-ST elevation (NSTEMI) myocardial infarction: Secondary | ICD-10-CM

## 2017-09-05 DIAGNOSIS — J101 Influenza due to other identified influenza virus with other respiratory manifestations: Secondary | ICD-10-CM | POA: Diagnosis not present

## 2017-09-05 DIAGNOSIS — I481 Persistent atrial fibrillation: Secondary | ICD-10-CM | POA: Diagnosis present

## 2017-09-05 DIAGNOSIS — D696 Thrombocytopenia, unspecified: Secondary | ICD-10-CM | POA: Diagnosis present

## 2017-09-05 DIAGNOSIS — I4891 Unspecified atrial fibrillation: Secondary | ICD-10-CM | POA: Diagnosis not present

## 2017-09-05 DIAGNOSIS — J9690 Respiratory failure, unspecified, unspecified whether with hypoxia or hypercapnia: Secondary | ICD-10-CM

## 2017-09-05 DIAGNOSIS — R0600 Dyspnea, unspecified: Secondary | ICD-10-CM

## 2017-09-05 DIAGNOSIS — R079 Chest pain, unspecified: Secondary | ICD-10-CM | POA: Diagnosis not present

## 2017-09-05 DIAGNOSIS — Z8249 Family history of ischemic heart disease and other diseases of the circulatory system: Secondary | ICD-10-CM

## 2017-09-05 DIAGNOSIS — A419 Sepsis, unspecified organism: Secondary | ICD-10-CM | POA: Diagnosis not present

## 2017-09-05 DIAGNOSIS — M10271 Drug-induced gout, right ankle and foot: Secondary | ICD-10-CM | POA: Diagnosis present

## 2017-09-05 DIAGNOSIS — J9601 Acute respiratory failure with hypoxia: Secondary | ICD-10-CM | POA: Diagnosis not present

## 2017-09-05 DIAGNOSIS — I959 Hypotension, unspecified: Secondary | ICD-10-CM | POA: Diagnosis not present

## 2017-09-05 DIAGNOSIS — R000 Tachycardia, unspecified: Secondary | ICD-10-CM

## 2017-09-05 DIAGNOSIS — K219 Gastro-esophageal reflux disease without esophagitis: Secondary | ICD-10-CM | POA: Diagnosis present

## 2017-09-05 DIAGNOSIS — Z79899 Other long term (current) drug therapy: Secondary | ICD-10-CM

## 2017-09-05 DIAGNOSIS — I482 Chronic atrial fibrillation: Secondary | ICD-10-CM | POA: Diagnosis not present

## 2017-09-05 DIAGNOSIS — Z91030 Bee allergy status: Secondary | ICD-10-CM

## 2017-09-05 DIAGNOSIS — I5031 Acute diastolic (congestive) heart failure: Secondary | ICD-10-CM | POA: Diagnosis not present

## 2017-09-05 DIAGNOSIS — E875 Hyperkalemia: Secondary | ICD-10-CM | POA: Diagnosis not present

## 2017-09-05 DIAGNOSIS — R05 Cough: Secondary | ICD-10-CM | POA: Diagnosis not present

## 2017-09-05 DIAGNOSIS — I10 Essential (primary) hypertension: Secondary | ICD-10-CM | POA: Diagnosis present

## 2017-09-05 DIAGNOSIS — I130 Hypertensive heart and chronic kidney disease with heart failure and stage 1 through stage 4 chronic kidney disease, or unspecified chronic kidney disease: Secondary | ICD-10-CM | POA: Diagnosis present

## 2017-09-05 DIAGNOSIS — Z6834 Body mass index (BMI) 34.0-34.9, adult: Secondary | ICD-10-CM

## 2017-09-05 DIAGNOSIS — Z951 Presence of aortocoronary bypass graft: Secondary | ICD-10-CM | POA: Diagnosis not present

## 2017-09-05 DIAGNOSIS — M6281 Muscle weakness (generalized): Secondary | ICD-10-CM | POA: Diagnosis not present

## 2017-09-05 DIAGNOSIS — Z885 Allergy status to narcotic agent status: Secondary | ICD-10-CM

## 2017-09-05 DIAGNOSIS — R41841 Cognitive communication deficit: Secondary | ICD-10-CM | POA: Diagnosis not present

## 2017-09-05 DIAGNOSIS — F05 Delirium due to known physiological condition: Secondary | ICD-10-CM | POA: Diagnosis not present

## 2017-09-05 DIAGNOSIS — Z9114 Patient's other noncompliance with medication regimen: Secondary | ICD-10-CM

## 2017-09-05 DIAGNOSIS — I2510 Atherosclerotic heart disease of native coronary artery without angina pectoris: Secondary | ICD-10-CM | POA: Diagnosis present

## 2017-09-05 DIAGNOSIS — I110 Hypertensive heart disease with heart failure: Secondary | ICD-10-CM | POA: Diagnosis not present

## 2017-09-05 DIAGNOSIS — J383 Other diseases of vocal cords: Secondary | ICD-10-CM | POA: Diagnosis present

## 2017-09-05 DIAGNOSIS — R0602 Shortness of breath: Secondary | ICD-10-CM | POA: Diagnosis present

## 2017-09-05 DIAGNOSIS — Z91018 Allergy to other foods: Secondary | ICD-10-CM

## 2017-09-05 DIAGNOSIS — G9341 Metabolic encephalopathy: Secondary | ICD-10-CM | POA: Diagnosis not present

## 2017-09-05 DIAGNOSIS — F0390 Unspecified dementia without behavioral disturbance: Secondary | ICD-10-CM | POA: Diagnosis present

## 2017-09-05 DIAGNOSIS — J111 Influenza due to unidentified influenza virus with other respiratory manifestations: Secondary | ICD-10-CM

## 2017-09-05 DIAGNOSIS — I5023 Acute on chronic systolic (congestive) heart failure: Secondary | ICD-10-CM

## 2017-09-05 DIAGNOSIS — Z8674 Personal history of sudden cardiac arrest: Secondary | ICD-10-CM

## 2017-09-05 DIAGNOSIS — J441 Chronic obstructive pulmonary disease with (acute) exacerbation: Secondary | ICD-10-CM | POA: Diagnosis not present

## 2017-09-05 DIAGNOSIS — Z91148 Patient's other noncompliance with medication regimen for other reason: Secondary | ICD-10-CM | POA: Diagnosis present

## 2017-09-05 DIAGNOSIS — Z91013 Allergy to seafood: Secondary | ICD-10-CM

## 2017-09-05 DIAGNOSIS — E860 Dehydration: Secondary | ICD-10-CM | POA: Diagnosis not present

## 2017-09-05 DIAGNOSIS — I5032 Chronic diastolic (congestive) heart failure: Secondary | ICD-10-CM | POA: Diagnosis not present

## 2017-09-05 DIAGNOSIS — R509 Fever, unspecified: Secondary | ICD-10-CM

## 2017-09-05 DIAGNOSIS — R0789 Other chest pain: Secondary | ICD-10-CM | POA: Diagnosis not present

## 2017-09-05 DIAGNOSIS — N179 Acute kidney failure, unspecified: Secondary | ICD-10-CM | POA: Diagnosis not present

## 2017-09-05 DIAGNOSIS — Z7951 Long term (current) use of inhaled steroids: Secondary | ICD-10-CM

## 2017-09-05 DIAGNOSIS — J09X2 Influenza due to identified novel influenza A virus with other respiratory manifestations: Secondary | ICD-10-CM | POA: Diagnosis not present

## 2017-09-05 DIAGNOSIS — I5033 Acute on chronic diastolic (congestive) heart failure: Secondary | ICD-10-CM | POA: Diagnosis not present

## 2017-09-05 DIAGNOSIS — I5042 Chronic combined systolic (congestive) and diastolic (congestive) heart failure: Secondary | ICD-10-CM

## 2017-09-05 DIAGNOSIS — I480 Paroxysmal atrial fibrillation: Secondary | ICD-10-CM | POA: Diagnosis present

## 2017-09-05 DIAGNOSIS — F17210 Nicotine dependence, cigarettes, uncomplicated: Secondary | ICD-10-CM | POA: Diagnosis present

## 2017-09-05 DIAGNOSIS — I255 Ischemic cardiomyopathy: Secondary | ICD-10-CM | POA: Diagnosis not present

## 2017-09-05 HISTORY — DX: Gout, unspecified: M10.9

## 2017-09-05 HISTORY — DX: Chronic kidney disease, stage 3 (moderate): N18.3

## 2017-09-05 HISTORY — DX: Atherosclerotic heart disease of native coronary artery without angina pectoris: I25.10

## 2017-09-05 HISTORY — DX: Other persistent atrial fibrillation: I48.19

## 2017-09-05 HISTORY — DX: Chronic kidney disease, stage 3 unspecified: N18.30

## 2017-09-05 HISTORY — DX: Hypokalemia: E87.6

## 2017-09-05 HISTORY — DX: Patient's noncompliance with other medical treatment and regimen: Z91.19

## 2017-09-05 HISTORY — DX: Patient's noncompliance with other medical treatment and regimen due to unspecified reason: Z91.199

## 2017-09-05 HISTORY — DX: Acute respiratory failure, unspecified whether with hypoxia or hypercapnia: J96.00

## 2017-09-05 LAB — BASIC METABOLIC PANEL
Anion gap: 15 (ref 5–15)
BUN: 13 mg/dL (ref 6–20)
CO2: 20 mmol/L — ABNORMAL LOW (ref 22–32)
Calcium: 9.3 mg/dL (ref 8.9–10.3)
Chloride: 109 mmol/L (ref 101–111)
Creatinine, Ser: 1.2 mg/dL — ABNORMAL HIGH (ref 0.44–1.00)
GFR calc Af Amer: 51 mL/min — ABNORMAL LOW (ref >60)
GFR calc non Af Amer: 44 mL/min — ABNORMAL LOW (ref >60)
Glucose, Bld: 128 mg/dL — ABNORMAL HIGH (ref 65–99)
Potassium: 3.3 mmol/L — ABNORMAL LOW (ref 3.5–5.1)
Sodium: 144 mmol/L (ref 135–145)

## 2017-09-05 LAB — TROPONIN I
Troponin I: 0.03 ng/mL (ref <0.03)
Troponin I: 0.03 ng/mL (ref <0.03)

## 2017-09-05 LAB — MRSA PCR SCREENING: MRSA by PCR: NEGATIVE

## 2017-09-05 LAB — CBC
HCT: 45.4 % (ref 36.0–46.0)
Hemoglobin: 15.1 g/dL — ABNORMAL HIGH (ref 12.0–15.0)
MCH: 28.9 pg (ref 26.0–34.0)
MCHC: 33.3 g/dL (ref 30.0–36.0)
MCV: 86.8 fL (ref 78.0–100.0)
Platelets: 130 10*3/uL — ABNORMAL LOW (ref 150–400)
RBC: 5.23 MIL/uL — ABNORMAL HIGH (ref 3.87–5.11)
RDW: 16.8 % — ABNORMAL HIGH (ref 11.5–15.5)
WBC: 8.4 10*3/uL (ref 4.0–10.5)

## 2017-09-05 LAB — TSH: TSH: 2.187 u[IU]/mL (ref 0.350–4.500)

## 2017-09-05 LAB — MAGNESIUM: Magnesium: 1.8 mg/dL (ref 1.7–2.4)

## 2017-09-05 MED ORDER — FAMOTIDINE 20 MG PO TABS
20.0000 mg | ORAL_TABLET | Freq: Every day | ORAL | Status: DC
  Administered 2017-09-06 – 2017-09-19 (×14): 20 mg via ORAL
  Filled 2017-09-05 (×14): qty 1

## 2017-09-05 MED ORDER — SODIUM CHLORIDE 0.9 % IV SOLN: 250.0000 mL | INTRAVENOUS | Status: DC | PRN

## 2017-09-05 MED ORDER — POTASSIUM CHLORIDE CRYS ER 20 MEQ PO TBCR
40.0000 meq | EXTENDED_RELEASE_TABLET | Freq: Two times a day (BID) | ORAL | Status: DC
  Administered 2017-09-06: 40 meq via ORAL
  Filled 2017-09-05: qty 2

## 2017-09-05 MED ORDER — DILTIAZEM HCL-DEXTROSE 100-5 MG/100ML-% IV SOLN (PREMIX)
5.0000 mg/h | INTRAVENOUS | Status: DC
  Administered 2017-09-05: 5 mg/h via INTRAVENOUS
  Administered 2017-09-05 – 2017-09-07 (×7): 15 mg/h via INTRAVENOUS
  Filled 2017-09-05 (×9): qty 100

## 2017-09-05 MED ORDER — FUROSEMIDE 10 MG/ML IJ SOLN
40.0000 mg | Freq: Once | INTRAMUSCULAR | Status: AC
  Administered 2017-09-05: 40 mg via INTRAVENOUS
  Filled 2017-09-05: qty 4

## 2017-09-05 MED ORDER — LORAZEPAM 2 MG/ML IJ SOLN
0.5000 mg | Freq: Once | INTRAMUSCULAR | Status: AC
  Administered 2017-09-05: 0.5 mg via INTRAVENOUS
  Filled 2017-09-05: qty 1

## 2017-09-05 MED ORDER — CARVEDILOL 25 MG PO TABS
25.0000 mg | ORAL_TABLET | Freq: Two times a day (BID) | ORAL | Status: DC
  Administered 2017-09-06 – 2017-09-15 (×20): 25 mg via ORAL
  Filled 2017-09-05 (×20): qty 1

## 2017-09-05 MED ORDER — MAGNESIUM OXIDE 400 (241.3 MG) MG PO TABS
400.0000 mg | ORAL_TABLET | Freq: Two times a day (BID) | ORAL | Status: DC
  Administered 2017-09-06: 400 mg via ORAL
  Filled 2017-09-05: qty 1

## 2017-09-05 MED ORDER — SODIUM CHLORIDE 0.9% FLUSH: 3.0000 mL | INTRAVENOUS | Status: DC | PRN

## 2017-09-05 MED ORDER — METOPROLOL TARTRATE 5 MG/5ML IV SOLN
5.0000 mg | Freq: Once | INTRAVENOUS | Status: AC
  Administered 2017-09-05: 5 mg via INTRAVENOUS
  Filled 2017-09-05: qty 5

## 2017-09-05 MED ORDER — ACETAMINOPHEN 500 MG PO TABS
500.0000 mg | ORAL_TABLET | Freq: Four times a day (QID) | ORAL | Status: DC | PRN
  Administered 2017-09-06 – 2017-09-13 (×6): 500 mg via ORAL
  Filled 2017-09-05 (×7): qty 1

## 2017-09-05 MED ORDER — SODIUM CHLORIDE 0.9% FLUSH
3.0000 mL | Freq: Two times a day (BID) | INTRAVENOUS | Status: DC
  Administered 2017-09-06 – 2017-09-24 (×29): 3 mL via INTRAVENOUS

## 2017-09-05 MED ORDER — FUROSEMIDE 10 MG/ML IJ SOLN
40.0000 mg | Freq: Two times a day (BID) | INTRAMUSCULAR | Status: DC
  Administered 2017-09-05 – 2017-09-07 (×4): 40 mg via INTRAVENOUS
  Filled 2017-09-05 (×4): qty 4

## 2017-09-05 MED ORDER — DILTIAZEM LOAD VIA INFUSION
20.0000 mg | Freq: Once | INTRAVENOUS | Status: AC
  Administered 2017-09-05: 20 mg via INTRAVENOUS
  Filled 2017-09-05: qty 20

## 2017-09-05 MED ORDER — RIVAROXABAN 20 MG PO TABS
20.0000 mg | ORAL_TABLET | Freq: Every day | ORAL | Status: DC
  Administered 2017-09-06 – 2017-09-14 (×10): 20 mg via ORAL
  Filled 2017-09-05 (×10): qty 1

## 2017-09-05 MED ORDER — FEBUXOSTAT 40 MG PO TABS
40.0000 mg | ORAL_TABLET | Freq: Every day | ORAL | Status: DC
  Administered 2017-09-06 – 2017-09-24 (×19): 40 mg via ORAL
  Filled 2017-09-05 (×19): qty 1

## 2017-09-05 MED ORDER — ATORVASTATIN CALCIUM 10 MG PO TABS
10.0000 mg | ORAL_TABLET | Freq: Every day | ORAL | Status: DC
  Administered 2017-09-06 – 2017-09-23 (×18): 10 mg via ORAL
  Filled 2017-09-05 (×18): qty 1

## 2017-09-05 NOTE — ED Notes (Signed)
EDP aware of troponin 

## 2017-09-05 NOTE — Progress Notes (Signed)
Patient arrived to unit from ED on [facility].  Pt patient on cardiac monitor and CCMD called. Patient has phone, purse, clothes, coat and medications. Patient aware that family will need to come and get her medications or belongings which need to go home. Patient states "it's OK I'm only gonna be here a couple of days". Patient will call family to pick up medication. Patient concerned about getting food. States she has not eaten in two days. Pt resting with call bell within reach.  Will continue to monitor. Payton Emerald, RN

## 2017-09-05 NOTE — H&P (Signed)
Cardiology Admission History and Physical:   Patient ID: [name] [name]; MRN: [id number]; DOB: 03/22/1946   Admission date: 09/05/2017  Primary Care Provider: [name], MD Primary Cardiologist: [name], MD  Primary Electrophysiologist:  Dr. Curt Bears   Chief Complaint:  Dyspnea and Palpitations   Patient Profile:   [name] [name] is a 72 y.o. female with a history of  CAD s/p CABG in 2016, Ischemic CM with improvement in EF to normal post CABG, atrial fibrillation w/ CHA2DS2 VASc score of 5, s/p LAA clipping and is on chronic OAC w/ Xarelto, HTN and questionable med compliance, presenting to the ED with CC of dyspnea and palpitations, found to be in atrial fibrillation w/ RVR and acute on chronic diastolic CHF.   History of Present Illness:   [name] [name] has been followed by Dr. Debara Pickett and also seen in the Afib Clinic by Roderic Palau, NP, and Dr. Curt Bears. She was noted to have atrial fibrillation in 2016 and reduced LVEF, leading to ischemic w/u. Cath showed severe multivessel CAD with LM involvement and she underwent CABG x 2 utilizing LIMA to LAD, SVG to OM. She also underwent clipping of her Left Atrial Appendage and endoscopic saphenous vein harvest from her right thigh, per Dr. Roxy Manns. EF improved from 45% back to normal range, 50-55% post CABG.   Just last month, in December, she had 2 separate admissions for symptomatic atrial fibrillation w/ RVR and acute CHF. First admission was 07/26/17-07/29/17. She was placed on IV diltiazem and Coreg dose increase. On 07/27/2017 she was seen by electrophysiologistDr. Allred. At that point the patient's rates had improved on diltiazem, and the patient was converted to p.o. diltiazem 240 mg daily. It was their recommendation that she begin on Xarelto 20 mg daily secondary to CHADS VASC Score of 5.She was to have a follow-up visit in the A. fib clinic to evaluate for Tikosyn admission. It was also recommended that the patient be placed  undergo a sleep study to evaluate for obstructive sleep apnea.  Pt had hospital f/u in the afib clinic on 08/04/17 and was back in afib and very symptomatic, dyspneic at rest. Weight was up 5 lb and in acute CHF. V-rates were in the 140s. She was directly admitted from the Afib clinic, back to [facility]. She was admitted 12/21-12/23. Rate control archived with IV Cardizem and PO meds titrated. Lasix given for CHF. D/ced home on Cardizem 240 mg and coreg 25 mg BID.   Pt is now back, once again, with afib w/ RVR. ED EKG showed rates in the 140s on arrival. Pt also with increased WOB and was placed on BiPAP. She is mildly hypokalemic at 3.3. Mg is 1.8. Troponin abnormal at 0.03.TSH is normal. No anemia. CXR shows mild increase in interstitial edema pattern. Significant bilateral LEE on exam. Pt denies CP. She reports full med compliance but I question this.   Past Medical History:  Diagnosis Date  . Arthritis   . Asthma   . Cardiomyopathy, ischemic    LVEF 50-55% as of May 2018  . Chronic combined systolic and diastolic heart failure ([place])   . Hypertension   . Left main coronary artery disease 11/04/2014  . LGI bleed 08/06/2017  . PAF (paroxysmal atrial fibrillation) ([place])   . S/P CABG x 2 with clipping of LA appendage 11/14/2014   LIMA to LAD, SVG to OM, EVH via right thigh    Past Surgical History:  Procedure Laterality Date  . CARDIOVERSION N/A 11/18/2014  Procedure: CARDIOVERSION;  Surgeon: [name], MD;  Location: [place];  Service: Cardiovascular;  Laterality: N/A;  . CLIPPING OF ATRIAL APPENDAGE N/A 11/14/2014   Procedure: CLIPPING OF ATRIAL APPENDAGE;  Surgeon: [name], MD;  Location: [place];  Service: Open Heart Surgery;  Laterality: N/A;  . CORONARY ARTERY BYPASS GRAFT N/A 11/14/2014   Procedure: CORONARY ARTERY BYPASS GRAFTING (CABG)TIMES 2 USING LEFT INTERNAL MAMMARY ARTERY AND RIGHT SAPHENOUS VEIN HARVESTED ENDOSCOPICALLY;  Surgeon: [name], MD;  Location: [place];  Service:  Open Heart Surgery;  Laterality: N/A;  . LEFT HEART CATHETERIZATION WITH CORONARY ANGIOGRAM N/A 11/04/2014   Procedure: LEFT HEART CATHETERIZATION WITH CORONARY ANGIOGRAM;  Surgeon: Troy Sine, MD;  Location: [facility] CATH LAB;  Service: Cardiovascular;  Laterality: N/A;  . TEE WITHOUT CARDIOVERSION N/A 11/14/2014   Procedure: TRANSESOPHAGEAL ECHOCARDIOGRAM (TEE);  Surgeon: [name], MD;  Location: [place];  Service: Open Heart Surgery;  Laterality: N/A;  . TEMPORARY PACEMAKER INSERTION  11/04/2014   Procedure: TEMPORARY PACEMAKER INSERTION;  Surgeon: Troy Sine, MD;  Location: [facility] CATH LAB;  Service: Cardiovascular;;     Medications Prior to Admission: Prior to Admission medications   Medication Sig Start Date End Date Taking? Authorizing Provider  acetaminophen (TYLENOL) 500 MG tablet Take 500 mg by mouth every 6 (six) hours as needed for moderate pain.     [provider]  atorvastatin (LIPITOR) 10 MG tablet TAKE 1 TABLET EVERY DAY&nbsp;&nbsp;AT&nbsp;&nbsp;6PM 08/16/17   Lendon Colonel, NP  carvedilol (COREG) 25 MG tablet Take 1 tablet (25 mg total) by mouth 2 (two) times daily with a meal. 08/02/17   Sherran Needs, NP  diltiazem (CARDIZEM CD) 240 MG 24 hr capsule Take 1 capsule (240 mg total) by mouth daily. 07/30/17   Lendon Colonel, NP  febuxostat ([name]) 40 MG tablet Take 40 mg by mouth daily as needed.    [provider]  fluticasone (FLONASE) 50 MCG/ACT nasal spray Place 1 spray into both nostrils daily. Patient taking differently: Place 1 spray into both nostrils daily as needed for allergies.  07/30/17   Lendon Colonel, NP  furosemide (LASIX) 40 MG tablet Take 40 mg twice a day 08/14/17   [name], MD  magnesium oxide (MAG-OX) 400 (241.3 Mg) MG tablet Take 1 tablet (400 mg total) by mouth 2 (two) times daily. 07/29/17   Lendon Colonel, NP  potassium chloride SA (K-DUR,KLOR-CON) 20 MEQ tablet Take 2 tablets (40 mEq total) by mouth 2  (two) times daily. 07/29/17   Lendon Colonel, NP  ranitidine (ZANTAC) 150 MG capsule Take 150 mg by mouth daily as needed for heartburn.     [provider]  rivaroxaban (XARELTO) 20 MG TABS tablet Take 1 tablet (20 mg total) by mouth daily with supper. 07/29/17   Lendon Colonel, NP     Allergies:    Allergies  Allergen Reactions  . Bee Venom Anaphylaxis  . Codeine Nausea And Vomiting  . Shrimp [Shellfish Allergy] Swelling  . Tomato     Pt states it gives her gout    Social History:   Social History   Socioeconomic History  . Marital status: Widowed    Spouse name: Not on file  . Number of children: Not on file  . Years of education: Not on file  . Highest education level: Not on file  Social Needs  . Financial resource strain: Not on file  . Food insecurity -  worry: Not on file  . Food insecurity - inability: Not on file  . Transportation needs - medical: Not on file  . Transportation needs - non-medical: Not on file  Occupational History  . Not on file  Tobacco Use  . Smoking status: Current Some Day Smoker    Packs/day: 1.00    Years: 32.00    Pack years: 32.00    Types: Cigarettes  . Smokeless tobacco: Never Used  . Tobacco comment: occasional puff here and there - 02/09/15  Substance and Sexual Activity  . Alcohol use: No    Alcohol/week: 0.0 oz  . Drug use: No  . Sexual activity: Not on file  Other Topics Concern  . Not on file  Social History Narrative  . Not on file    Family History:   [name] family history includes Cancer in her mother; Heart disease in her father.    ROS:  Please see the history of present illness.  All other ROS reviewed and negative.     Physical Exam/Data:   Vitals:   09/05/17 1100 09/05/17 1130 09/05/17 1145 09/05/17 1200  BP: (!) 138/94 (!) 126/91 (!) 168/97 (!) 107/91  Pulse: (!) 110 (!) 102 (!) 101 96  Resp:      SpO2: 97% 100% 97% 100%  Weight:      Height:       No intake or output data  in the 24 hours ending 09/05/17 1316 Filed Weights   09/05/17 0723  Weight: 222 lb (100.7 kg)   Body mass index is 34.26 kg/m.  General:  Well nourished, well developed, in no acute distress, moderately obese, on BiPAP HEENT: normal Lymph: no adenopathy Neck: noJVD Endocrine:  No thryomegaly Vascular: No carotid bruits; FA pulses 2+ bilaterally without bruits  Cardiac:  irregularly irregular, tachy rate Lungs:  clear to auscultation bilaterally, no wheezing, rhonchi or rales  Abd: soft, nontender, no hepatomegaly  Ext: 2+ bilateral LE pitting edema  Musculoskeletal:  No deformities, BUE and BLE strength normal and equal Skin: warm and dry  Neuro:  CNs 2-12 intact, no focal abnormalities noted Psych:  Normal affect    EKG:  The ECG that was done 09/05/17 was personally reviewed and demonstrates atrial fibrillation w/ RVR  Relevant CV Studies: 2D Echo 01/11/18 Study Conclusions  - Left ventricle: Posterior lateral hypokinesis. The cavity size   was normal. Systolic function was normal. The estimated ejection   fraction was in the range of 50% to 55%. Left ventricular   diastolic function parameters were normal. - Aortic valve: There was mild regurgitation. - Mitral valve: There was mild regurgitation. - Left atrium: The atrium was moderately dilated. - Atrial septum: No defect or patent foramen ovale was identified.  Laboratory Data:  Chemistry Recent Labs  Lab 09/05/17 0815  NA 144  K 3.3*  CL 109  CO2 20*  GLUCOSE 128*  BUN 13  CREATININE 1.20*  CALCIUM 9.3  GFRNONAA 44*  GFRAA 51*  ANIONGAP 15    No results for input(s): PROT, ALBUMIN, AST, ALT, ALKPHOS, BILITOT in the last 168 hours. Hematology Recent Labs  Lab 09/05/17 0815  WBC 8.4  RBC 5.23*  HGB 15.1*  HCT 45.4  MCV 86.8  MCH 28.9  MCHC 33.3  RDW 16.8*  PLT 130*   Cardiac Enzymes Recent Labs  Lab 09/05/17 0815  TROPONINI 0.03*   No results for input(s): TROPIPOC in the last 168 hours.    BNPNo results for input(s): BNP,  PROBNP in the last 168 hours.  DDimer No results for input(s): DDIMER in the last 168 hours.  Radiology/Studies:  Dg Chest Portable 1 View  Result Date: 09/05/2017 CLINICAL DATA:  Short of breath for 3 days EXAM: PORTABLE CHEST 1 VIEW COMPARISON:  08/04/2017 FINDINGS: Sternotomy wires overlie stable cardiac silhouette. Bibasilar atelectasis. Mild central venous congestion. Mild peripheral interstitial edema. No focal consolidation. No pneumothorax. IMPRESSION: 1. Mild increase in interstitial edema pattern. 2. Mild bibasilar atelectasis. Electronically Signed   By: Suzy Bouchard M.D.   On: 09/05/2017 09:09    Assessment and Plan:   1. Atrial Fibrillation w/ RVR: Persistent  Atrial Fibrillation with poorly controlled rates. This will be the patient's 3rd hospitalization for uncontrolled atrial fibrillation and acute on chronic CHF in the last 6 weeks. Pt states she has been compliant with meds, but I am doubtfully of this. There were previous conversations regarding initiation of Tikosyn after 3 full weeks for anticoagulation with Xarelto, but given ? Compliance she may not be a good candidate for AAD therapy. Will defer to primary cardiologist. Will also ask EP to see tomorrow for additional recs (? Ablation). For now continue rate control. Rates have improved from the 140s down to the low 100s w/ IV Cardizem. Will also continue PO Coreg. Correct hypokalemia (3.3).  Will give dose of K-dur 40 mEq. Mg is stable at 1.8 and TSH is WNL. She would also benefit from diuresis, given she is volume overloaded. Monitor on tele.   2. Acute on Chronic Diastolic HF: volume overloaded with bilateral LE pitting edema on exam. CXR with interstitial edema and pt required initiation of Bipap in ED. Pt is on Lasix 40 mg PO BID at home. Will order IV 40 mg BID. Strict I/Os, daily weights, daily BMPs and low sodium diet.   3.  Hypokalemia: 3.3. Give supplemental K, 40 mEq now. F/u BMP  in the am given treatment with IV diuretics. Mg normal at 1.8.   4. Chronic Renal Insufficieny: SCr 1.20 in ED. Monitor closely with diuresis.   5. Abnormal Troponin: minimally abnormal at 0.03. Pt denies CP. Suspect demand ischemia from rapid afib and acute CHF. However given known CAD, will assess trend. No additional w/u if trend is flat and low level.   6. CAD: h/o CABG in 2016. Pt denies CP. Continue medical therapy.    Severity of Illness: The appropriate patient status for this patient is INPATIENT. Inpatient status is judged to be reasonable and necessary in order to provide the required intensity of service to ensure the patient's safety. [name] presenting symptoms, physical exam findings, and initial radiographic and laboratory data in the context of their chronic comorbidities is felt to place them at high risk for further clinical deterioration. Furthermore, it is not anticipated that the patient will be medically stable for discharge from the hospital within 2 midnights of admission. The following factors support the patient status of inpatient.   " [name] presenting symptoms include dyspnea and palpitations. " The worrisome physical exam findings include irregular heart rhythm, tachy rate and edema . " The initial radiographic and laboratory data are worrisome because of interstitial edema on CXR and atrial fibrillation w/ RVR on EKG/ telemetry. " The chronic co-morbidities include CAD, persistent atrial fibrillation, HF.   * I certify that at the point of admission it is my clinical judgment that the patient will require inpatient hospital care spanning beyond 2 midnights from the point of admission due to high  intensity of service, high risk for further deterioration and high frequency of surveillance required.    For questions or updates, please contact [place] Please consult [email or web address] for contact info under Cardiology/STEMI.    Signed, Lyda Jester, PA-C  09/05/2017 1:16 PM

## 2017-09-05 NOTE — ED Provider Notes (Signed)
[facility] EMERGENCY DEPARTMENT Provider Note   CSN: [id number] Arrival date & time: 09/05/17  5053     History   Chief Complaint Chief Complaint  Patient presents with  . Atrial Fibrillation    HPI [name] [name] is a 72 y.o. female.  Pt presents to the ED today with sob and cp.  The pt has a hx of a.fib that has been poorly controlled likely due to noncompliance.  The pt was admitted 12/21-23 for the same.  She had a cardiology visit on 1/2 and was encouraged to take her meds.  She said she has not slept in 2 days due to the sob.  She said she's been compliant with her meds.  The pt was given nitro by EMS which helped her cp.  No cp now.  EMS said pt's HR was 200 when they arrived.  They attempted an IV, but was unsuccessful.  Pt has nausea, no vomiting.      Past Medical History:  Diagnosis Date  . Arthritis   . Asthma   . Cardiomyopathy, ischemic    LVEF 50-55% as of May 2018  . Chronic combined systolic and diastolic heart failure ([place])   . Hypertension   . Left main coronary artery disease 11/04/2014  . LGI bleed 08/06/2017  . PAF (paroxysmal atrial fibrillation) ([place])   . S/P CABG x 2 with clipping of LA appendage 11/14/2014   LIMA to LAD, SVG to OM, EVH via right thigh    Patient Active Problem List   Diagnosis Date Noted  . Acute on chronic diastolic heart failure ([place]) 08/06/2017  . LGI bleed, likely hemorrhoids 08/06/2017  . Atrial fibrillation ([place]) 08/04/2017  . Paroxysmal atrial fibrillation ([place]) 02/08/2017  . Cardiomyopathy, ischemic 02/08/2017  . Dysphagia 02/08/2017  . CKD (chronic kidney disease), stage III ([place]) 01/30/2017  . Pain in shoulder 02/08/2016  . Breast pain, left 02/08/2016  . Bilateral arm numbness and tingling while sleeping 02/08/2016  . Painful lumpy left breast 09/23/2015  . [place] intertrigo 02/11/2015  . S/P CABG x 2 11/14/2014  . Accelerated hypertension   . Cardiac arrest ([place]) 11/04/2014  . Left main  coronary artery disease 11/04/2014  . Coronary artery disease due to lipid rich plaque   . Acute respiratory failure with hypoxemia ([place])   . Essential hypertension   . Atrial fibrillation with RVR ([place])   . Hypokalemia 10/30/2014  . Chronic combined systolic and diastolic CHF (congestive heart failure) ([place]) 10/30/2014  . NSTEMI (non-ST elevated myocardial infarction) ([place])   . DOE (dyspnea on exertion)   . CAP (community acquired pneumonia) 10/29/2014    Past Surgical History:  Procedure Laterality Date  . CARDIOVERSION N/A 11/18/2014   Procedure: CARDIOVERSION;  Surgeon: [name], MD;  Location: [facility];  Service: Cardiovascular;  Laterality: N/A;  . CLIPPING OF ATRIAL APPENDAGE N/A 11/14/2014   Procedure: CLIPPING OF ATRIAL APPENDAGE;  Surgeon: [name], MD;  Location: [facility];  Service: Open Heart Surgery;  Laterality: N/A;  . CORONARY ARTERY BYPASS GRAFT N/A 11/14/2014   Procedure: CORONARY ARTERY BYPASS GRAFTING (CABG)TIMES 2 USING LEFT INTERNAL MAMMARY ARTERY AND RIGHT SAPHENOUS VEIN HARVESTED ENDOSCOPICALLY;  Surgeon: [name], MD;  Location: [facility];  Service: Open Heart Surgery;  Laterality: N/A;  . LEFT HEART CATHETERIZATION WITH CORONARY ANGIOGRAM N/A 11/04/2014   Procedure: LEFT HEART CATHETERIZATION WITH CORONARY ANGIOGRAM;  Surgeon: Troy Sine, MD;  Location: [facility] CATH LAB;  Service: Cardiovascular;  Laterality: N/A;  . TEE WITHOUT CARDIOVERSION N/A 11/14/2014   Procedure: TRANSESOPHAGEAL ECHOCARDIOGRAM (TEE);  Surgeon: [name], MD;  Location: [facility];  Service: Open Heart Surgery;  Laterality: N/A;  . TEMPORARY PACEMAKER INSERTION  11/04/2014   Procedure: TEMPORARY PACEMAKER INSERTION;  Surgeon: Troy Sine, MD;  Location: [facility] CATH LAB;  Service: Cardiovascular;;    OB History    No data available       Home Medications    Prior to Admission medications   Medication Sig Start Date End Date Taking? Authorizing Provider  acetaminophen (TYLENOL) 500  MG tablet Take 500 mg by mouth every 6 (six) hours as needed for moderate pain.     [provider]  atorvastatin (LIPITOR) 10 MG tablet TAKE 1 TABLET EVERY DAY  AT  6PM 08/16/17   Lendon Colonel, NP  carvedilol (COREG) 25 MG tablet Take 1 tablet (25 mg total) by mouth 2 (two) times daily with a meal. 08/02/17   Sherran Needs, NP  diltiazem (CARDIZEM CD) 240 MG 24 hr capsule Take 1 capsule (240 mg total) by mouth daily. 07/30/17   Lendon Colonel, NP  febuxostat ([name]) 40 MG tablet Take 40 mg by mouth daily as needed.    [provider]  fluticasone (FLONASE) 50 MCG/ACT nasal spray Place 1 spray into both nostrils daily. Patient taking differently: Place 1 spray into both nostrils daily as needed for allergies.  07/30/17   Lendon Colonel, NP  furosemide (LASIX) 40 MG tablet Take 40 mg twice a day 08/14/17   [name], MD  magnesium oxide (MAG-OX) 400 (241.3 Mg) MG tablet Take 1 tablet (400 mg total) by mouth 2 (two) times daily. 07/29/17   Lendon Colonel, NP  potassium chloride SA (K-DUR,KLOR-CON) 20 MEQ tablet Take 2 tablets (40 mEq total) by mouth 2 (two) times daily. 07/29/17   Lendon Colonel, NP  ranitidine (ZANTAC) 150 MG capsule Take 150 mg by mouth daily as needed for heartburn.     [provider]  rivaroxaban (XARELTO) 20 MG TABS tablet Take 1 tablet (20 mg total) by mouth daily with supper. 07/29/17   Lendon Colonel, NP    Family History Family History  Problem Relation Age of Onset  . Cancer Mother   . Heart disease Father     Social History Social History   Tobacco Use  . Smoking status: Current Some Day Smoker    Packs/day: 1.00    Years: 32.00    Pack years: 32.00    Types: Cigarettes  . Smokeless tobacco: Never Used  . Tobacco comment: occasional puff here and there - 02/09/15  Substance Use Topics  . Alcohol use: No    Alcohol/week: 0.0 oz  . Drug use: No     Allergies   Bee venom; Codeine; Shrimp  [shellfish allergy]; and Tomato   Review of Systems Review of Systems  Respiratory: Positive for shortness of breath.   Cardiovascular: Positive for chest pain and palpitations.  All other systems reviewed and are negative.    Physical Exam Updated Vital Signs BP 139/89   Pulse (!) 107   Resp 18   Ht 5' 7.5" (1.715 m)   Wt 100.7 kg (222 lb)   SpO2 100%   BMI 34.26 kg/m   Physical Exam  Constitutional: She is oriented to person, place, and time. She appears well-developed. She appears distressed.  HENT:  Head: Normocephalic and atraumatic.  Right Ear: External  ear normal.  Left Ear: External ear normal.  Nose: Nose normal.  Mouth/Throat: Oropharynx is clear and moist.  Eyes: Conjunctivae and EOM are normal. Pupils are equal, round, and reactive to light.  Neck: Normal range of motion. Neck supple.  Cardiovascular: An irregularly irregular rhythm present. Tachycardia present.  Pulmonary/Chest: Tachypnea noted. She is in respiratory distress. She has rales.  Abdominal: Soft. Bowel sounds are normal.  Musculoskeletal: She exhibits edema.  Neurological: She is alert and oriented to person, place, and time.  Skin: Skin is warm. Capillary refill takes less than 2 seconds.  Psychiatric: She has a normal mood and affect. Her behavior is normal. Judgment and thought content normal.  Nursing note and vitals reviewed.    ED Treatments / Results  Labs (all labs ordered are listed, but only abnormal results are displayed) Labs Reviewed  BASIC METABOLIC PANEL - Abnormal; Notable for the following components:      Result Value   Potassium 3.3 (*)    CO2 20 (*)    Glucose, Bld 128 (*)    Creatinine, Ser 1.20 (*)    GFR calc non Af Amer 44 (*)    GFR calc Af Amer 51 (*)    All other components within normal limits  CBC - Abnormal; Notable for the following components:   RBC 5.23 (*)    Hemoglobin 15.1 (*)    RDW 16.8 (*)    Platelets 130 (*)    All other components within  normal limits  TROPONIN I - Abnormal; Notable for the following components:   Troponin I 0.03 (*)    All other components within normal limits  MAGNESIUM  TSH    EKG  EKG Interpretation  Date/Time:  Tuesday September 05 2017 07:24:05 EST Ventricular Rate:  141 PR Interval:    QRS Duration: 99 QT Interval:  299 QTC Calculation: 458 R Axis:   32 Text Interpretation:  Atrial fibrillation with rapid V-rate LVH with secondary repolarization abnormality Since last tracing rate faster Confirmed by Isla Pence [phone]) on 09/05/2017 7:37:18 AM       Radiology Dg Chest Portable 1 View  Result Date: 09/05/2017 CLINICAL DATA:  Short of breath for 3 days EXAM: PORTABLE CHEST 1 VIEW COMPARISON:  08/04/2017 FINDINGS: Sternotomy wires overlie stable cardiac silhouette. Bibasilar atelectasis. Mild central venous congestion. Mild peripheral interstitial edema. No focal consolidation. No pneumothorax. IMPRESSION: 1. Mild increase in interstitial edema pattern. 2. Mild bibasilar atelectasis. Electronically Signed   By: Suzy Bouchard M.D.   On: 09/05/2017 09:09    Procedures Procedures (including critical care time)  Medications Ordered in ED Medications  diltiazem (CARDIZEM) 1 mg/mL load via infusion 20 mg (20 mg Intravenous Bolus from Bag 09/05/17 0746)    And  diltiazem (CARDIZEM) 100 mg in dextrose 5% 130mL (1 mg/mL) infusion (15 mg/hr Intravenous Rate/Dose Change 09/05/17 1406)  metoprolol tartrate (LOPRESSOR) injection 5 mg (5 mg Intravenous Given 09/05/17 1002)  LORazepam (ATIVAN) injection 0.5 mg (0.5 mg Intravenous Given 09/05/17 0938)  furosemide (LASIX) injection 40 mg (40 mg Intravenous Given 09/05/17 1030)     Initial Impression / Assessment and Plan / ED Course  I have reviewed the triage vital signs and the nursing notes.  Pertinent labs & imaging results that were available during my care of the patient were reviewed by me and considered in my medical decision making (see  chart for details).    Pt's breathing did not improve with HR control, so she was put on  bipap.  This has helped considerably.  HR still up and down.  She was d/w cardiology who will see her.  They will admit.  CHA2DS2/VAS Stroke Risk Points      5 >= 2 Points: High Risk  1 - 1.99 Points: Medium Risk  0 Points: Low Risk    [name] score has not changed in the past year.:  No Change     Details    This score determines the patient's risk of having a stroke if the  patient has atrial fibrillation.       Points Metrics  1 Has Congestive Heart Failure:  Yes   1 Has Vascular Disease:  Yes   1 Has Hypertension:  Yes   1 Age:  19   0 Has Diabetes:  No   0 Had Stroke:  No  Had TIA:  No  Had thromboembolism:  No   1 Female:  Yes         Pt is on Xarelto.  CRITICAL CARE Performed by: Isla Pence   Total critical care time: 27minutes  Critical care time was exclusive of separately billable procedures and treating other patients.  Critical care was necessary to treat or prevent imminent or life-threatening deterioration.  Critical care was time spent personally by me on the following activities: development of treatment plan with patient and/or surrogate as well as nursing, discussions with consultants, evaluation of patient's response to treatment, examination of patient, obtaining history from patient or surrogate, ordering and performing treatments and interventions, ordering and review of laboratory studies, ordering and review of radiographic studies, pulse oximetry and re-evaluation of patient's condition.  Final Clinical Impressions(s) / ED Diagnoses   Final diagnoses:  Atrial fibrillation with RVR ([place])  Acute on chronic congestive heart failure, unspecified heart failure type [facility])    ED Discharge Orders    None       Isla Pence, MD 09/05/17 1435

## 2017-09-05 NOTE — Progress Notes (Signed)
Order for BIPAP. Patient has labored breathing with a RR of 40. Accessory muscle use. Placed on bipap 10/5 40%. Patient a little anxious at first but began to rest after a minute. Will continue to monitor.

## 2017-09-05 NOTE — ED Notes (Signed)
Patient reporting her wallet is missing. Security at bedside

## 2017-09-05 NOTE — Progress Notes (Signed)
RT called to patient room by RN. Patient had removed bipap mask and pulled her IV out. Patient's sats were dropping in the 80's. Patient wants to eat but RT explained to patient that our main focus currently is keeping her oxygen sats up. Patient understands and is currently back on bipap. Vitals are stable and sats are 96%. RT will continue to monitor.

## 2017-09-05 NOTE — ED Triage Notes (Signed)
Per [facility] patient coming from home after she was woke up with chest pain and shortness of breath. On ems arrival patient heart rate 200 in afib rhythm. Hx of afib. Reports nausea but no vomiting. Lung sounds clear with ems. Patient alert and oriented. 1 nitro given in route. No chest pain on arrival.

## 2017-09-05 NOTE — Progress Notes (Signed)
Patient cannot tolerate been off [place]. Has evening meds and dinner to eat. On call MD paged three.Will continue to monitor. Order is to try to give Coreg and Xarelto. Done.

## 2017-09-06 ENCOUNTER — Encounter (HOSPITAL_COMMUNITY): Payer: Self-pay | Admitting: General Practice

## 2017-09-06 ENCOUNTER — Other Ambulatory Visit: Payer: Self-pay

## 2017-09-06 DIAGNOSIS — N183 Chronic kidney disease, stage 3 (moderate): Secondary | ICD-10-CM

## 2017-09-06 DIAGNOSIS — Z9114 Patient's other noncompliance with medication regimen: Secondary | ICD-10-CM | POA: Diagnosis present

## 2017-09-06 DIAGNOSIS — I255 Ischemic cardiomyopathy: Secondary | ICD-10-CM

## 2017-09-06 LAB — BASIC METABOLIC PANEL
Anion gap: 16 — ABNORMAL HIGH (ref 5–15)
BUN: 10 mg/dL (ref 6–20)
CO2: 21 mmol/L — ABNORMAL LOW (ref 22–32)
Calcium: 8.7 mg/dL — ABNORMAL LOW (ref 8.9–10.3)
Chloride: 107 mmol/L (ref 101–111)
Creatinine, Ser: 1.01 mg/dL — ABNORMAL HIGH (ref 0.44–1.00)
GFR calc Af Amer: >60 mL/min (ref >60)
GFR calc non Af Amer: 54 mL/min — ABNORMAL LOW (ref >60)
Glucose, Bld: 116 mg/dL — ABNORMAL HIGH (ref 65–99)
Potassium: 3.2 mmol/L — ABNORMAL LOW (ref 3.5–5.1)
Sodium: 144 mmol/L (ref 135–145)

## 2017-09-06 LAB — TROPONIN I: Troponin I: 0.03 ng/mL (ref <0.03)

## 2017-09-06 MED ORDER — MAGNESIUM OXIDE 400 (241.3 MG) MG PO TABS
400.0000 mg | ORAL_TABLET | Freq: Three times a day (TID) | ORAL | Status: DC
  Administered 2017-09-06 – 2017-09-24 (×53): 400 mg via ORAL
  Filled 2017-09-06 (×55): qty 1

## 2017-09-06 MED ORDER — POTASSIUM CHLORIDE CRYS ER 20 MEQ PO TBCR
80.0000 meq | EXTENDED_RELEASE_TABLET | Freq: Two times a day (BID) | ORAL | Status: DC
  Administered 2017-09-06 – 2017-09-12 (×11): 80 meq via ORAL
  Filled 2017-09-06 (×12): qty 4

## 2017-09-06 NOTE — Progress Notes (Addendum)
DAILY PROGRESS NOTE   Patient Name: [name] [name] Date of Encounter: 09/06/2017  Chief Complaint   Breathing is better today  Patient Profile   [name] [name] is a 72 y.o. female with a history of  CAD s/p CABG in 2016, Ischemic CM with improvement in EF to normal post CABG, atrial fibrillation w/ CHA2DS2 VASc score of 5, s/p LAA clipping and is on chronic [place] w/ Xarelto, HTN and questionable med compliance, presenting to the ED with CC of dyspnea and palpitations, found to be in atrial fibrillation w/ RVR and acute on chronic diastolic CHF.   Subjective   BP improved today - diuresed 1.1L negative. Weight down 4 lbs since admit. Off bipap, but on ventimask. Says she was not compliant with diltiazem because it caused her loose stools. She is on mag and potassium repletion - may be related to that.   Objective   Vitals:   09/06/17 0000 09/06/17 0010 09/06/17 0400 09/06/17 0758  BP: (!) 151/110 (!) 151/110 133/86 122/66  Pulse: 96 (!) 113 (!) 115 80  Resp: (!) 27 (!) 21 (!) 29 (!) 28  Temp: (!) 97.1 F (36.2 C)   97.8 F (36.6 C)  TempSrc: Oral   Oral  SpO2: 99% 95% 92% 96%  Weight:   218 lb 7.6 oz (99.1 kg)   Height:        Intake/Output Summary (Last 24 hours) at 09/06/2017 1044 Last data filed at 09/06/2017 0800 Gross per 24 hour  Intake 760 ml  Output 1525 ml  Net -765 ml   Filed Weights   09/05/17 0723 09/05/17 1624 09/06/17 0400  Weight: 222 lb (100.7 kg) 222 lb 10.6 oz (101 kg) 218 lb 7.6 oz (99.1 kg)    Physical Exam   General appearance: alert, mild distress and moderately obese Neck: JVD - 3 cm above sternal notch, no carotid bruit and thyroid not enlarged, symmetric, no tenderness/mass/nodules Lungs: diminished breath sounds bilaterally and wheezes bilaterally Heart: irregularly irregular rhythm Abdomen: soft, non-tender; bowel sounds normal; no masses,  no organomegaly Extremities: extremities normal, atraumatic, no cyanosis or edema Pulses: 2+ and  symmetric Skin: Skin color, texture, turgor normal. No rashes or lesions Neurologic: Mental status: Somnolent, but awakens easily and able to follow commands Psych: Pleasant  Inpatient Medications    Scheduled Meds:  . atorvastatin  10 mg Oral q1800  . carvedilol  25 mg Oral BID WC  . famotidine  20 mg Oral Daily  . febuxostat  40 mg Oral Daily  . furosemide  40 mg Intravenous BID  . magnesium oxide  400 mg Oral BID  . potassium chloride SA  40 mEq Oral BID  . rivaroxaban  20 mg Oral Q supper  . sodium chloride flush  3 mL Intravenous Q12H    Continuous Infusions: . sodium chloride    . diltiazem (CARDIZEM) infusion 15 mg/hr (09/06/17 0906)    PRN Meds: sodium chloride, acetaminophen, sodium chloride flush   Labs   Results for orders placed or performed during the hospital encounter of 09/05/17 (from the past 48 hour(s))  Basic metabolic panel     Status: Abnormal   Collection Time: 09/05/17  8:15 AM  Result Value Ref Range   Sodium 144 135 - 145 mmol/L   Potassium 3.3 (L) 3.5 - 5.1 mmol/L   Chloride 109 101 - 111 mmol/L   CO2 20 (L) 22 - 32 mmol/L   Glucose, Bld 128 (H) 65 - 99 mg/dL  BUN 13 6 - 20 mg/dL   Creatinine, Ser 1.20 (H) 0.44 - 1.00 mg/dL   Calcium 9.3 8.9 - 10.3 mg/dL   GFR calc non Af Amer 44 (L) >60 mL/min   GFR calc Af Amer 51 (L) >60 mL/min    Comment: (NOTE) The eGFR has been calculated using the CKD EPI equation. This calculation has not been validated in all clinical situations. eGFR's persistently <60 mL/min signify possible Chronic Kidney Disease.    Anion gap 15 5 - 15  Magnesium     Status: None   Collection Time: 09/05/17  8:15 AM  Result Value Ref Range   Magnesium 1.8 1.7 - 2.4 mg/dL  CBC     Status: Abnormal   Collection Time: 09/05/17  8:15 AM  Result Value Ref Range   WBC 8.4 4.0 - 10.5 K/uL   RBC 5.23 (H) 3.87 - 5.11 MIL/uL   Hemoglobin 15.1 (H) 12.0 - 15.0 g/dL   HCT 45.4 36.0 - 46.0 %   MCV 86.8 78.0 - 100.0 fL   MCH 28.9  26.0 - 34.0 pg   MCHC 33.3 30.0 - 36.0 g/dL   RDW 16.8 (H) 11.5 - 15.5 %   Platelets 130 (L) 150 - 400 K/uL  TSH     Status: None   Collection Time: 09/05/17  8:15 AM  Result Value Ref Range   TSH 2.187 0.350 - 4.500 uIU/mL    Comment: Performed by a 3rd Generation assay with a functional sensitivity of <=0.01 uIU/mL.  Troponin I     Status: Abnormal   Collection Time: 09/05/17  8:15 AM  Result Value Ref Range   Troponin I 0.03 (HH) <0.03 ng/mL    Comment: CRITICAL RESULT CALLED TO, READ BACK BY AND VERIFIED WITHDub Mikes [phone]   Troponin I     Status: Abnormal   Collection Time: 09/05/17  4:41 PM  Result Value Ref Range   Troponin I 0.03 (HH) <0.03 ng/mL    Comment: CRITICAL VALUE NOTED.  VALUE IS CONSISTENT WITH PREVIOUSLY REPORTED AND CALLED VALUE.  MRSA PCR Screening     Status: None   Collection Time: 09/05/17  7:18 PM  Result Value Ref Range   MRSA by PCR NEGATIVE NEGATIVE    Comment:        The GeneXpert MRSA Assay (FDA approved for NASAL specimens only), is one component of a comprehensive MRSA colonization surveillance program. It is not intended to diagnose MRSA infection nor to guide or monitor treatment for MRSA infections.   Basic metabolic panel     Status: Abnormal   Collection Time: 09/06/17  1:00 AM  Result Value Ref Range   Sodium 144 135 - 145 mmol/L   Potassium 3.2 (L) 3.5 - 5.1 mmol/L   Chloride 107 101 - 111 mmol/L   CO2 21 (L) 22 - 32 mmol/L   Glucose, Bld 116 (H) 65 - 99 mg/dL   BUN 10 6 - 20 mg/dL   Creatinine, Ser 1.01 (H) 0.44 - 1.00 mg/dL   Calcium 8.7 (L) 8.9 - 10.3 mg/dL   GFR calc non Af Amer 54 (L) >60 mL/min   GFR calc Af Amer >60 >60 mL/min    Comment: (NOTE) The eGFR has been calculated using the CKD EPI equation. This calculation has not been validated in all clinical situations. eGFR's persistently <60 mL/min signify possible Chronic Kidney Disease.    Anion gap 16 (H) 5 - 15  Troponin I  Status:  Abnormal   Collection Time: 09/06/17  7:34 AM  Result Value Ref Range   Troponin I 0.03 (HH) <0.03 ng/mL    Comment: CRITICAL VALUE NOTED.  VALUE IS CONSISTENT WITH PREVIOUSLY REPORTED AND CALLED VALUE.    ECG   N/A  Telemetry   Afib with CVR- Personally Reviewed  Radiology    Dg Chest Portable 1 View  Result Date: 09/05/2017 CLINICAL DATA:  Short of breath for 3 days EXAM: PORTABLE CHEST 1 VIEW COMPARISON:  08/04/2017 FINDINGS: Sternotomy wires overlie stable cardiac silhouette. Bibasilar atelectasis. Mild central venous congestion. Mild peripheral interstitial edema. No focal consolidation. No pneumothorax. IMPRESSION: 1. Mild increase in interstitial edema pattern. 2. Mild bibasilar atelectasis. Electronically Signed   By: Suzy Bouchard M.D.   On: 09/05/2017 09:09    Cardiac Studies   N/A  Assessment   Principal Problem:   Atrial fibrillation with RVR (HCC) Active Problems:   S/P CABG x 2   CKD (chronic kidney disease), stage III (H[facility])   Cardiomyopathy, ischemic   Acute diastolic CHF (congestive heart failure) ([facility])   Medication noncompliance due to cognitive impairment   Plan   1. Diuresing well on lasix - now rate-controlled on diltiazem. Continue IV diuresis. Issues with rate control - d/t medication non-compliance. A-fib with RVR driving acute diastolic CHF. Will ask EP to help with options. Ablation may help reduce burden of events and prevent readmissions. Increase magnesium and potassium daily repletion.  Time Spent Directly with Patient:  I have spent a total of 25 minutes with the patient reviewing hospital notes, telemetry, EKGs, labs and examining the patient as well as establishing an assessment and plan that was discussed personally with the patient. > 50% of time was spent in direct patient care.  Length of Stay:  LOS: 1 day   [name], MD, [facility], [place] Director of the Advanced Lipid Disorders &    Cardiovascular Risk Reduction Clinic Diplomate of the American Board of Clinical Lipidology Attending Cardiologist  Direct Dial: [phone]  Fax: [phone]  Website:  [email or web address] 09/06/2017, 10:44 AM

## 2017-09-06 NOTE — Consult Note (Signed)
   [facility] Inpatient Consult   09/06/2017  [name] [name] Nov 23, 1945 [id number]   Patient screened for potential [facility] Care Management services due to hospital readmission. She was contacted recently by [facility] but patient declined services. Please see chart review then encounters for patient outreach details.  Went to bedside to speak with [name] [name]. However, she was nodding off in the chair. She was on venturi mask. Spoke with nursing. Patient currently in stepdown. Will follow up with patient at later time to discuss [place] Management.   Marthenia Rolling, MSN-Ed, RN,BSN [facility] Liaison [phone]

## 2017-09-07 DIAGNOSIS — I5033 Acute on chronic diastolic (congestive) heart failure: Secondary | ICD-10-CM

## 2017-09-07 DIAGNOSIS — I2510 Atherosclerotic heart disease of native coronary artery without angina pectoris: Secondary | ICD-10-CM

## 2017-09-07 LAB — BASIC METABOLIC PANEL
Anion gap: 10 (ref 5–15)
BUN: 16 mg/dL (ref 6–20)
CO2: 23 mmol/L (ref 22–32)
Calcium: 8.6 mg/dL — ABNORMAL LOW (ref 8.9–10.3)
Chloride: 107 mmol/L (ref 101–111)
Creatinine, Ser: 1.25 mg/dL — ABNORMAL HIGH (ref 0.44–1.00)
GFR calc Af Amer: 49 mL/min — ABNORMAL LOW (ref >60)
GFR calc non Af Amer: 42 mL/min — ABNORMAL LOW (ref >60)
Glucose, Bld: 133 mg/dL — ABNORMAL HIGH (ref 65–99)
Potassium: 3.9 mmol/L (ref 3.5–5.1)
Sodium: 140 mmol/L (ref 135–145)

## 2017-09-07 MED ORDER — ALUM & MAG HYDROXIDE-SIMETH 200-200-20 MG/5ML PO SUSP
30.0000 mL | Freq: Four times a day (QID) | ORAL | Status: DC | PRN
  Administered 2017-09-07 – 2017-09-11 (×2): 30 mL via ORAL
  Filled 2017-09-07 (×3): qty 30

## 2017-09-07 MED ORDER — DILTIAZEM HCL ER COATED BEADS 180 MG PO CP24
360.0000 mg | ORAL_CAPSULE | Freq: Every day | ORAL | Status: DC
  Administered 2017-09-07 – 2017-09-13 (×7): 360 mg via ORAL
  Filled 2017-09-07 (×7): qty 2

## 2017-09-07 MED ORDER — FUROSEMIDE 10 MG/ML IJ SOLN
80.0000 mg | Freq: Two times a day (BID) | INTRAMUSCULAR | Status: DC
  Administered 2017-09-07 – 2017-09-09 (×4): 80 mg via INTRAVENOUS
  Filled 2017-09-07 (×4): qty 8

## 2017-09-07 MED ORDER — FUROSEMIDE 10 MG/ML IJ SOLN
40.0000 mg | INTRAMUSCULAR | Status: AC
  Administered 2017-09-07: 40 mg via INTRAVENOUS
  Filled 2017-09-07: qty 4

## 2017-09-07 NOTE — Progress Notes (Signed)
DAILY PROGRESS NOTE   Patient Name: [name] [name] Date of Encounter: 09/07/2017  Chief Complaint   Breathing is better today  Patient Profile   [name] [name] is a 72 y.o. female with a history of  CAD s/p CABG in 2016, Ischemic CM with improvement in EF to normal post CABG, atrial fibrillation w/ CHA2DS2 VASc score of 5, s/p LAA clipping and is on chronic [place] w/ Xarelto, HTN and questionable med compliance, presenting to the ED with CC of dyspnea and palpitations, found to be in atrial fibrillation w/ RVR and acute on chronic diastolic CHF.   Subjective   Appreciate EP recommendations - not a candidate for ablation or antiarryhmic therapy at this time due to non-compliance. HR control improved. Diuresing. Recorded only 400 cc negative - weight up 2 lbs. ?accurate. Troponin flat at 0.03. Creatinine stable. Potassium improved.   Objective   Vitals:   09/06/17 2352 09/07/17 0434 09/07/17 0744 09/07/17 0800  BP: 102/74 115/76 110/86 (!) 119/98  Pulse: 70 (!) 59 91 72  Resp: (!) 22 (!) 27 (!) 40 (!) 26  Temp: (!) 97 F (36.1 C) 97.6 F (36.4 C) (!) 97.4 F (36.3 C)   TempSrc: Oral Oral Oral   SpO2: (!) 48% 95% 98%   Weight:  220 lb 12.8 oz (100.2 kg)    Height:        Intake/Output Summary (Last 24 hours) at 09/07/2017 1108 Last data filed at 09/06/2017 1330 Gross per 24 hour  Intake 360 ml  Output -  Net 360 ml   Filed Weights   09/05/17 1624 09/06/17 0400 09/07/17 0434  Weight: 222 lb 10.6 oz (101 kg) 218 lb 7.6 oz (99.1 kg) 220 lb 12.8 oz (100.2 kg)    Physical Exam   General appearance: alert, mild distress and moderately obese Neck: JVD - 3 cm above sternal notch, no carotid bruit and thyroid not enlarged, symmetric, no tenderness/mass/nodules Lungs: diminished breath sounds bilaterally and wheezes bilaterally Heart: irregularly irregular rhythm Abdomen: soft, non-tender; bowel sounds normal; no masses,  no organomegaly Extremities: extremities normal,  atraumatic, no cyanosis or edema Pulses: 2+ and symmetric Skin: Skin color, texture, turgor normal. No rashes or lesions Neurologic: Mental status: Somnolent, but awakens easily and able to follow commands Psych: Pleasant  Inpatient Medications    Scheduled Meds:  . atorvastatin  10 mg Oral q1800  . carvedilol  25 mg Oral BID WC  . famotidine  20 mg Oral Daily  . febuxostat  40 mg Oral Daily  . furosemide  40 mg Intravenous BID  . magnesium oxide  400 mg Oral TID  . potassium chloride SA  80 mEq Oral BID  . rivaroxaban  20 mg Oral Q supper  . sodium chloride flush  3 mL Intravenous Q12H    Continuous Infusions: . sodium chloride    . diltiazem (CARDIZEM) infusion 15 mg/hr (09/07/17 1003)    PRN Meds: sodium chloride, acetaminophen, sodium chloride flush   Labs   Results for orders placed or performed during the hospital encounter of 09/05/17 (from the past 48 hour(s))  Troponin I     Status: Abnormal   Collection Time: 09/05/17  4:41 PM  Result Value Ref Range   Troponin I 0.03 (HH) <0.03 ng/mL    Comment: CRITICAL VALUE NOTED.  VALUE IS CONSISTENT WITH PREVIOUSLY REPORTED AND CALLED VALUE.  MRSA PCR Screening     Status: None   Collection Time: 09/05/17  7:18 PM  Result  Value Ref Range   MRSA by PCR NEGATIVE NEGATIVE    Comment:        The GeneXpert MRSA Assay (FDA approved for NASAL specimens only), is one component of a comprehensive MRSA colonization surveillance program. It is not intended to diagnose MRSA infection nor to guide or monitor treatment for MRSA infections.   Basic metabolic panel     Status: Abnormal   Collection Time: 09/06/17  1:00 AM  Result Value Ref Range   Sodium 144 135 - 145 mmol/L   Potassium 3.2 (L) 3.5 - 5.1 mmol/L   Chloride 107 101 - 111 mmol/L   CO2 21 (L) 22 - 32 mmol/L   Glucose, Bld 116 (H) 65 - 99 mg/dL   BUN 10 6 - 20 mg/dL   Creatinine, Ser 1.01 (H) 0.44 - 1.00 mg/dL   Calcium 8.7 (L) 8.9 - 10.3 mg/dL   GFR calc non  Af Amer 54 (L) >60 mL/min   GFR calc Af Amer >60 >60 mL/min    Comment: (NOTE) The eGFR has been calculated using the CKD EPI equation. This calculation has not been validated in all clinical situations. eGFR's persistently <60 mL/min signify possible Chronic Kidney Disease.    Anion gap 16 (H) 5 - 15  Troponin I     Status: Abnormal   Collection Time: 09/06/17  7:34 AM  Result Value Ref Range   Troponin I 0.03 (HH) <0.03 ng/mL    Comment: CRITICAL VALUE NOTED.  VALUE IS CONSISTENT WITH PREVIOUSLY REPORTED AND CALLED VALUE.  Basic metabolic panel     Status: Abnormal   Collection Time: 09/07/17  2:16 AM  Result Value Ref Range   Sodium 140 135 - 145 mmol/L   Potassium 3.9 3.5 - 5.1 mmol/L    Comment: DELTA CHECK NOTED   Chloride 107 101 - 111 mmol/L   CO2 23 22 - 32 mmol/L   Glucose, Bld 133 (H) 65 - 99 mg/dL   BUN 16 6 - 20 mg/dL   Creatinine, Ser 1.25 (H) 0.44 - 1.00 mg/dL   Calcium 8.6 (L) 8.9 - 10.3 mg/dL   GFR calc non Af Amer 42 (L) >60 mL/min   GFR calc Af Amer 49 (L) >60 mL/min    Comment: (NOTE) The eGFR has been calculated using the CKD EPI equation. This calculation has not been validated in all clinical situations. eGFR's persistently <60 mL/min signify possible Chronic Kidney Disease.    Anion gap 10 5 - 15    ECG   N/A  Telemetry   Afib with CVR- Personally Reviewed  Radiology    No results found.  Cardiac Studies   N/A  Assessment   Principal Problem:   Atrial fibrillation with RVR (HCC) Active Problems:   S/P CABG x 2   CKD (chronic kidney disease), stage III (HCC)   Cardiomyopathy, ischemic   Acute diastolic CHF (congestive heart failure) (HCC)   Medication noncompliance due to cognitive impairment   Plan   1. Still remains volume overloaded on exam - weight recorded to be increased with little significant urine output. Increase lasix to 80 mg IV BID. Strict I's and O's and daily weights. Transition from IV to po diltiazem 360 daily  today. Maalox for indigestion per her request. Will ask PT to evaluate for home needs.  Time Spent Directly with Patient:  I have spent a total of 25 minutes with the patient reviewing hospital notes, telemetry, EKGs, labs and examining the patient as well  as establishing an assessment and plan that was discussed personally with the patient. > 50% of time was spent in direct patient care.  Length of Stay:  LOS: 2 days   [name], MD, [facility], [place] Director of the Advanced Lipid Disorders &  Cardiovascular Risk Reduction Clinic Diplomate of the American Board of Clinical Lipidology Attending Cardiologist  Direct Dial: [phone]  Fax: [phone]  Website:  [email or web address] 09/07/2017, 11:08 AM

## 2017-09-07 NOTE — Consult Note (Signed)
Cardiology Consultation:   Patient ID: [name] [name]; [id number]; 02-06-1946   Admit date: 09/05/2017 Date of Consult: 09/07/2017  Primary Care Provider: [name], MD Primary Cardiologist: [name], MD  Primary Electrophysiologist:  Curt Bears   Patient Profile:   [name] [name] is a 72 y.o. female with a hx of coronary disease status post CABG in 2016, ischemic cardiomyopathy with improvement of her ejection fraction post CABG, atrial fibrillation status post left atrial appendage clipping, hypertension who is being seen today for the evaluation of atrial fibrillation at the request of Lyman Bishop.  History of Present Illness:   [name] she presented to the emergency room on 1/22 with palpitations and dyspnea.  She was found to be in atrial fibrillation with rapid rates and acute on chronic diastolic heart failure.  She had 2 admissions last month for symptomatic atrial fibrillation and acute heart failure.  She was placed on carvedilol and diltiazem at the time.  She was discharged on both of these medications.  She was seen in atrial fibrillation clinic for possible Tigas and admission.  She was found to be in rapid atrial fibrillation and readmitted.  With further discussions, it is apparent that she has been noncompliant with her medications.  She says that there are many days that she misses doses of both her anticoagulation and rate control.  On admission to the hospital, she was placed on BiPAP due to increased work of breathing.  She was found to be hypokalemic.  Past Medical History:  Diagnosis Date  . Acute respiratory failure ([place]) 08/2017  . Arthritis   . Asthma   . Cardiomyopathy, ischemic    LVEF 50-55% as of May 2018  . Chronic combined systolic and diastolic heart failure ([place])   . CKD (chronic kidney disease), stage III ([place])   . Hypertension   . Left main coronary artery disease 11/04/2014  . LGI bleed 08/06/2017  . PAF (paroxysmal atrial fibrillation)  ([place])   . S/P CABG x 2 with clipping of LA appendage 11/14/2014   LIMA to LAD, SVG to OM, EVH via right thigh    Past Surgical History:  Procedure Laterality Date  . CARDIOVERSION N/A 11/18/2014   Procedure: CARDIOVERSION;  Surgeon: [name], MD;  Location: [facility];  Service: Cardiovascular;  Laterality: N/A;  . CLIPPING OF ATRIAL APPENDAGE N/A 11/14/2014   Procedure: CLIPPING OF ATRIAL APPENDAGE;  Surgeon: [name], MD;  Location: [facility];  Service: Open Heart Surgery;  Laterality: N/A;  . CORONARY ARTERY BYPASS GRAFT N/A 11/14/2014   Procedure: CORONARY ARTERY BYPASS GRAFTING (CABG)TIMES 2 USING LEFT INTERNAL MAMMARY ARTERY AND RIGHT SAPHENOUS VEIN HARVESTED ENDOSCOPICALLY;  Surgeon: [name], MD;  Location: [place];  Service: Open Heart Surgery;  Laterality: N/A;  . LEFT HEART CATHETERIZATION WITH CORONARY ANGIOGRAM N/A 11/04/2014   Procedure: LEFT HEART CATHETERIZATION WITH CORONARY ANGIOGRAM;  Surgeon: Troy Sine, MD;  Location: [facility] CATH LAB;  Service: Cardiovascular;  Laterality: N/A;  . TEE WITHOUT CARDIOVERSION N/A 11/14/2014   Procedure: TRANSESOPHAGEAL ECHOCARDIOGRAM (TEE);  Surgeon: [name], MD;  Location: [place];  Service: Open Heart Surgery;  Laterality: N/A;  . TEMPORARY PACEMAKER INSERTION  11/04/2014   Procedure: TEMPORARY PACEMAKER INSERTION;  Surgeon: Troy Sine, MD;  Location: [facility] CATH LAB;  Service: Cardiovascular;;     Home Medications:  Prior to Admission medications   Medication Sig Start Date End Date Taking? Authorizing Provider  carvedilol (COREG) 25 MG tablet Take  1 tablet (25 mg total) by mouth 2 (two) times daily with a meal. 08/02/17  Yes Sherran Needs, NP  diltiazem (CARDIZEM CD) 240 MG 24 hr capsule Take 1 capsule (240 mg total) by mouth daily. 07/30/17  Yes Lendon Colonel, NP  fluticasone [facility]) 50 MCG/ACT nasal spray Place 1 spray into both nostrils daily. Patient taking differently: Place 1 spray into both nostrils daily as  needed for allergies.  07/30/17  Yes Lendon Colonel, NP  furosemide (LASIX) 40 MG tablet Take 60 mg by mouth 2 (two) times daily.  08/14/17  Yes Hilty, Nadean Corwin, MD  ketoconazole (NIZORAL) 2 % cream Apply 1 application topically 2 (two) times daily. 08/21/17  Yes [provider]  rivaroxaban (XARELTO) 20 MG TABS tablet Take 1 tablet (20 mg total) by mouth daily with supper. 07/29/17  Yes Lendon Colonel, NP  tiZANidine (ZANAFLEX) 2 MG tablet Take 2 mg by mouth daily as needed for muscle spasms. 08/25/17  Yes [provider]  acetaminophen (TYLENOL) 500 MG tablet Take 500 mg by mouth every 6 (six) hours as needed for moderate pain.     [provider]  atorvastatin (LIPITOR) 10 MG tablet TAKE 1 TABLET EVERY DAY&nbsp;&nbsp;AT&nbsp;&nbsp;6PM 08/16/17   Lendon Colonel, NP  febuxostat ([name]) 40 MG tablet Take 40 mg by mouth daily as needed.    [provider]  fluconazole (DIFLUCAN) 100 MG tablet Take 100 mg by mouth daily. 08/21/17   [provider]  magnesium oxide (MAG-OX) 400 (241.3 Mg) MG tablet Take 1 tablet (400 mg total) by mouth 2 (two) times daily. 07/29/17   Lendon Colonel, NP  potassium chloride SA (K-DUR,KLOR-CON) 20 MEQ tablet Take 2 tablets (40 mEq total) by mouth 2 (two) times daily. 07/29/17   Lendon Colonel, NP  ranitidine (ZANTAC) 150 MG capsule Take 150 mg by mouth daily as needed for heartburn.     [provider]    Inpatient Medications: Scheduled Meds: . atorvastatin  10 mg Oral q1800  . carvedilol  25 mg Oral BID WC  . famotidine  20 mg Oral Daily  . febuxostat  40 mg Oral Daily  . furosemide  40 mg Intravenous BID  . magnesium oxide  400 mg Oral TID  . potassium chloride SA  80 mEq Oral BID  . rivaroxaban  20 mg Oral Q supper  . sodium chloride flush  3 mL Intravenous Q12H   Continuous Infusions: . sodium chloride    . diltiazem (CARDIZEM) infusion 15 mg/hr (09/07/17 0427)   PRN Meds: sodium  chloride, acetaminophen, sodium chloride flush  Allergies:    Allergies  Allergen Reactions  . Bee Venom Anaphylaxis  . Codeine Nausea And Vomiting  . Shrimp [Shellfish Allergy] Swelling  . Tomato     Pt states it gives her gout    Social History:   Social History   Socioeconomic History  . Marital status: Widowed    Spouse name: Not on file  . Number of children: Not on file  . Years of education: Not on file  . Highest education level: Not on file  Social Needs  . Financial resource strain: Not on file  . Food insecurity - worry: Not on file  . Food insecurity - inability: Not on file  . Transportation needs - medical: Not on file  . Transportation needs - non-medical: Not on file  Occupational History  . Not on file  Tobacco Use  . Smoking status:  Current Some Day Smoker    Packs/day: 0.10    Years: 56.00    Pack years: 5.60    Types: Cigarettes  . Smokeless tobacco: Never Used  Substance and Sexual Activity  . Alcohol use: No    Alcohol/week: 0.0 oz  . Drug use: No  . Sexual activity: Not on file  Other Topics Concern  . Not on file  Social History Narrative  . Not on file    Family History:    Family History  Problem Relation Age of Onset  . Cancer Mother   . Heart disease Father      ROS:  Please see the history of present illness.  ROS  All other ROS reviewed and negative.     Physical Exam/Data:   Vitals:   09/06/17 1640 09/06/17 2044 09/06/17 2352 09/07/17 0434  BP: 110/61 99/72 102/74 115/76  Pulse: 92 (!) 59 70 (!) 59  Resp: 18 (!) 31 (!) 22 (!) 27  Temp: (!) 97.5 F (36.4 C) 97.6 F (36.4 C) (!) 97 F (36.1 C) 97.6 F (36.4 C)  TempSrc: Oral Oral Oral Oral  SpO2: 98% 92% (!) 48% 95%  Weight:    220 lb 12.8 oz (100.2 kg)  Height:        Intake/Output Summary (Last 24 hours) at 09/07/2017 0739 Last data filed at 09/06/2017 1330 Gross per 24 hour  Intake 700 ml  Output -  Net 700 ml   Filed Weights   09/05/17 1624 09/06/17  0400 09/07/17 0434  Weight: 222 lb 10.6 oz (101 kg) 218 lb 7.6 oz (99.1 kg) 220 lb 12.8 oz (100.2 kg)   Body mass index is 34.58 kg/m.  General:  Well nourished, well developed, in no acute distress HEENT: normal Lymph: no adenopathy Neck: no JVD Endocrine:  No thryomegaly Vascular: No carotid bruits; FA pulses 2+ bilaterally without bruits  Cardiac:  iRRR; no murmur  Lungs:  clear to auscultation bilaterally, no wheezing, rhonchi or rales  Abd: soft, nontender, no hepatomegaly  Ext: no edema Musculoskeletal:  No deformities, BUE and BLE strength normal and equal Skin: warm and dry  Neuro:  CNs 2-12 intact, no focal abnormalities noted Psych:  Normal affect   EKG:  The EKG was personally reviewed and demonstrates:  Atrial fibrillation, rate 141 Telemetry:  Telemetry was personally reviewed and demonstrates:  Atrial fibrillation  Relevant CV Studies: TTE 12/2016 - Left ventricle: Posterior lateral hypokinesis. The cavity size   was normal. Systolic function was normal. The estimated ejection   fraction was in the range of 50% to 55%. Left ventricular   diastolic function parameters were normal. - Aortic valve: There was mild regurgitation. - Mitral valve: There was mild regurgitation. - Left atrium: The atrium was moderately dilated. - Atrial septum: No defect or patent foramen ovale was identified.  Laboratory Data:  Chemistry Recent Labs  Lab 09/05/17 0815 09/06/17 0100 09/07/17 0216  NA 144 144 140  K 3.3* 3.2* 3.9  CL 109 107 107  CO2 20* 21* 23  GLUCOSE 128* 116* 133*  BUN 13 10 16   CREATININE 1.20* 1.01* 1.25*  CALCIUM 9.3 8.7* 8.6*  GFRNONAA 44* 54* 42*  GFRAA 51* >60 49*  ANIONGAP 15 16* 10    No results for input(s): PROT, ALBUMIN, AST, ALT, ALKPHOS, BILITOT in the last 168 hours. Hematology Recent Labs  Lab 09/05/17 0815  WBC 8.4  RBC 5.23*  HGB 15.1*  HCT 45.4  MCV 86.8  MCH 28.9  MCHC 33.3  RDW 16.8*  PLT 130*   Cardiac Enzymes Recent  Labs  Lab 09/05/17 0815 09/05/17 1641 09/06/17 0734  TROPONINI 0.03* 0.03* 0.03*   No results for input(s): TROPIPOC in the last 168 hours.  BNPNo results for input(s): BNP, PROBNP in the last 168 hours.  DDimer No results for input(s): DDIMER in the last 168 hours.  Radiology/Studies:  Dg Chest Portable 1 View  Result Date: 09/05/2017 CLINICAL DATA:  Short of breath for 3 days EXAM: PORTABLE CHEST 1 VIEW COMPARISON:  08/04/2017 FINDINGS: Sternotomy wires overlie stable cardiac silhouette. Bibasilar atelectasis. Mild central venous congestion. Mild peripheral interstitial edema. No focal consolidation. No pneumothorax. IMPRESSION: 1. Mild increase in interstitial edema pattern. 2. Mild bibasilar atelectasis. Electronically Signed   By: Suzy Bouchard M.D.   On: 09/05/2017 09:09    Assessment and Plan:   1. Atrial fibrillation with rapid ventricular response: Likely due to medication noncompliance.  This was admitted by the patient.  She says that there are many days that she misses doses of both her anticoagulation and her rate control.  Unfortunately, she has had many admissions for heart failure and atrial fibrillation with rapid rates.  Due to her medication noncompliance, she would not be an ablation candidate at this time.  It is worrisome that she has not taken her anticoagulation consistently, and she would need to be on this for 3 months post ablation.  She also would not likely be a candidate for antiarrhythmic medications.  Feel the Tigas and would not be a good medication for her due to her noncompliance, and additionally without being consistently anticoagulated, amiodarone would not be an ideal medicine either.  At this point, she would most likely benefit from rate control.  Would continue her carvedilol at the current dose, and plan to increase her diltiazem to 360 mg.  Should her rate control continue to be an issue, she may benefit from AV nodal ablation and pacemaker  implantation. 2. Acute on chronic diastolic heart failure: Continues to be volume overloaded.  Agree with further diuresis. 3. 3.  Coronary artery disease status post CABG in 2016: No current chest pain.  Continue medical therapy.    For questions or updates, please contact [place] Please consult [email or web address] for contact info under Cardiology/STEMI.   Signed, Addis Bennie Meredith Leeds, MD  09/07/2017 7:39 AM

## 2017-09-08 ENCOUNTER — Encounter: Payer: Self-pay | Admitting: *Deleted

## 2017-09-08 DIAGNOSIS — M10271 Drug-induced gout, right ankle and foot: Secondary | ICD-10-CM

## 2017-09-08 DIAGNOSIS — I509 Heart failure, unspecified: Secondary | ICD-10-CM

## 2017-09-08 LAB — BASIC METABOLIC PANEL
Anion gap: 12 (ref 5–15)
BUN: 18 mg/dL (ref 6–20)
CO2: 23 mmol/L (ref 22–32)
Calcium: 9.5 mg/dL (ref 8.9–10.3)
Chloride: 108 mmol/L (ref 101–111)
Creatinine, Ser: 1.21 mg/dL — ABNORMAL HIGH (ref 0.44–1.00)
GFR calc Af Amer: 51 mL/min — ABNORMAL LOW (ref >60)
GFR calc non Af Amer: 44 mL/min — ABNORMAL LOW (ref >60)
Glucose, Bld: 118 mg/dL — ABNORMAL HIGH (ref 65–99)
Potassium: 4.5 mmol/L (ref 3.5–5.1)
Sodium: 143 mmol/L (ref 135–145)

## 2017-09-08 MED ORDER — HYDRALAZINE HCL 25 MG PO TABS
25.0000 mg | ORAL_TABLET | Freq: Three times a day (TID) | ORAL | Status: DC
  Administered 2017-09-08 – 2017-09-14 (×15): 25 mg via ORAL
  Filled 2017-09-08 (×17): qty 1

## 2017-09-08 MED ORDER — COLCHICINE 0.6 MG PO TABS
0.6000 mg | ORAL_TABLET | Freq: Once | ORAL | Status: AC
  Administered 2017-09-08: 0.6 mg via ORAL
  Filled 2017-09-08: qty 1

## 2017-09-08 MED ORDER — COLCHICINE 0.6 MG PO TABS
1.2000 mg | ORAL_TABLET | ORAL | Status: AC
Start: 2017-09-08 — End: 2017-09-08
  Administered 2017-09-08: 1.2 mg via ORAL
  Filled 2017-09-08: qty 2

## 2017-09-08 NOTE — Progress Notes (Addendum)
DAILY PROGRESS NOTE   Patient Name: [name] [name] Date of Encounter: 09/08/2017  Chief Complaint   Right foot red, swollen  Patient Profile   [name] [name] is a 72 y.o. female with a history of  CAD s/p CABG in 2016, Ischemic CM with improvement in EF to normal post CABG, atrial fibrillation w/ CHA2DS2 VASc score of 5, s/p LAA clipping and is on chronic OAC w/ Xarelto, HTN and questionable med compliance, presenting to the ED with CC of dyspnea and palpitations, found to be in atrial fibrillation w/ RVR and acute on chronic diastolic CHF.   Subjective   Diuresed 780 cc recorded overnight - weight now 214 from 220 lbs yesterday. BP remains elevated. PT has not yet evaluated. Right midfoot red and swollen, tender to light touch.   Objective   Vitals:   09/07/17 1958 09/08/17 0016 09/08/17 0629 09/08/17 0743  BP: (!) 126/105 115/89 (!) 134/104 (!) 150/110  Pulse: 98 93 86 96  Resp: (!) 26 (!) 24 (!) 24   Temp: 97.7 F (36.5 C) (!) 97.5 F (36.4 C) 97.9 F (36.6 C) (!) 97.4 F (36.3 C)  TempSrc: Oral Oral Oral Oral  SpO2: 96% 96% 95% 93%  Weight:   214 lb 9.6 oz (97.3 kg)   Height:        Intake/Output Summary (Last 24 hours) at 09/08/2017 0851 Last data filed at 09/08/2017 1779 Gross per 24 hour  Intake 350 ml  Output 725 ml  Net -375 ml   Filed Weights   09/06/17 0400 09/07/17 0434 09/08/17 0629  Weight: 218 lb 7.6 oz (99.1 kg) 220 lb 12.8 oz (100.2 kg) 214 lb 9.6 oz (97.3 kg)    Physical Exam   General appearance: alert, mild distress and moderately obese Neck: JVD - 1 cm above sternal notch, no carotid bruit and thyroid not enlarged, symmetric, no tenderness/mass/nodules Lungs: diminished breath sounds bilaterally and wheezes bilaterally Heart: irregularly irregular rhythm Abdomen: soft, non-tender; bowel sounds normal; no masses,  no organomegaly Extremities: right midfoot swollen, eryhtemous, warm to touch Pulses: 2+ and symmetric Skin: Skin color, texture,  turgor normal. No rashes or lesions Neurologic: Mental status: Alert, oriented, thought content appropriate Psych: Pleasant  Inpatient Medications    Scheduled Meds:  . atorvastatin  10 mg Oral q1800  . carvedilol  25 mg Oral BID WC  . diltiazem  360 mg Oral Daily  . famotidine  20 mg Oral Daily  . febuxostat  40 mg Oral Daily  . furosemide  80 mg Intravenous BID  . magnesium oxide  400 mg Oral TID  . potassium chloride SA  80 mEq Oral BID  . rivaroxaban  20 mg Oral Q supper  . sodium chloride flush  3 mL Intravenous Q12H    Continuous Infusions: . sodium chloride      PRN Meds: sodium chloride, acetaminophen, alum & mag hydroxide-simeth, sodium chloride flush   Labs   Results for orders placed or performed during the hospital encounter of 09/05/17 (from the past 48 hour(s))  Basic metabolic panel     Status: Abnormal   Collection Time: 09/07/17  2:16 AM  Result Value Ref Range   Sodium 140 135 - 145 mmol/L   Potassium 3.9 3.5 - 5.1 mmol/L    Comment: DELTA CHECK NOTED   Chloride 107 101 - 111 mmol/L   CO2 23 22 - 32 mmol/L   Glucose, Bld 133 (H) 65 - 99 mg/dL   BUN 16  6 - 20 mg/dL   Creatinine, Ser 1.25 (H) 0.44 - 1.00 mg/dL   Calcium 8.6 (L) 8.9 - 10.3 mg/dL   GFR calc non Af Amer 42 (L) >60 mL/min   GFR calc Af Amer 49 (L) >60 mL/min    Comment: (NOTE) The eGFR has been calculated using the CKD EPI equation. This calculation has not been validated in all clinical situations. eGFR's persistently <60 mL/min signify possible Chronic Kidney Disease.    Anion gap 10 5 - 15  Basic metabolic panel     Status: Abnormal   Collection Time: 09/08/17  6:47 AM  Result Value Ref Range   Sodium 143 135 - 145 mmol/L   Potassium 4.5 3.5 - 5.1 mmol/L   Chloride 108 101 - 111 mmol/L   CO2 23 22 - 32 mmol/L   Glucose, Bld 118 (H) 65 - 99 mg/dL   BUN 18 6 - 20 mg/dL   Creatinine, Ser 1.21 (H) 0.44 - 1.00 mg/dL   Calcium 9.5 8.9 - 10.3 mg/dL   GFR calc non Af Amer 44 (L) >60  mL/min   GFR calc Af Amer 51 (L) >60 mL/min    Comment: (NOTE) The eGFR has been calculated using the CKD EPI equation. This calculation has not been validated in all clinical situations. eGFR's persistently <60 mL/min signify possible Chronic Kidney Disease.    Anion gap 12 5 - 15    ECG   N/A  Telemetry   Afib with CVR- Personally Reviewed  Radiology    No results found.  Cardiac Studies   N/A  Assessment   Principal Problem:   Atrial fibrillation with RVR (HCC) Active Problems:   S/P CABG x 2   CKD (chronic kidney disease), stage III (HCC)   Cardiomyopathy, ischemic   Acute diastolic CHF (congestive heart failure) (HCC)   Medication noncompliance due to cognitive impairment   Plan   1. Continue IV diuresis today, creatinine stable - PT to evaluate for home needs. Will add hydralazine 25 mg TID for elevated BP and afterload reduction. May have right midfoot gout - warm, swollen and painful to touch - Tx with colchicine 1.2 mg x 1 then 0.6 mg 1-2 hours later. She is on febuxostat - check uric acid level in am. Close to d/c - probably over the weekend.  Time Spent Directly with Patient:  I have spent a total of 25 minutes with the patient reviewing hospital notes, telemetry, EKGs, labs and examining the patient as well as establishing an assessment and plan that was discussed personally with the patient. > 50% of time was spent in direct patient care.  Length of Stay:  LOS: 3 days   [name], MD, [facility], [place] Director of the Advanced Lipid Disorders &  Cardiovascular Risk Reduction Clinic Diplomate of the American Board of Clinical Lipidology Attending Cardiologist  Direct Dial: [phone]  Fax: [phone]  Website:  [email or web address] 09/08/2017, 8:51 AM

## 2017-09-08 NOTE — Care Management Important Message (Signed)
Important Message  Patient Details  Name: [name] [name] MRN: [id number] Date of Birth: 10-15-1945   Medicare Important Message Given:  Yes    Carles Collet, RN 09/08/2017, 10:58 AM

## 2017-09-08 NOTE — Evaluation (Signed)
Physical Therapy Evaluation Patient Details Name: [name] [name] MRN: [id number] DOB: 1945/08/21 Today's Date: 09/08/2017   History of Present Illness  Pt adm with afib with rvr and acute heart failure. Pt also developed painful rt foot. PMH - CAD, s/p CABG, parox afib, HTN, ICM, gout  Clinical Impression  Pt presents to PT with slightly decr mobility due to painful rt foot but able to amb adequately for home needs. Pt with limited cognition and does not seem to understand her medical issues. Pt could benefit from incr supervision at home for her cognitive/medical issues but unsure if she would accept these. Pt may also refuse the HHPT and the rolling walker.    Follow Up Recommendations Home health PT    Equipment Recommendations  Rolling walker with 5" wheels    Recommendations for Other Services       Precautions / Restrictions Precautions Precautions: Fall Restrictions Weight Bearing Restrictions: No      Mobility  Bed Mobility               General bed mobility comments: Pt up in chair  Transfers Overall transfer level: Needs assistance Equipment used: Rolling walker (2 wheeled);None Transfers: Sit to/from [facility] Sit to Stand: Supervision Stand pivot transfers: Supervision       General transfer comment: assist for management of lines and safety. Pt transferred chair to bsc to chair without assistive device.  Ambulation/Gait Ambulation/Gait assistance: Supervision Ambulation Distance (Feet): 50 Feet Assistive device: Rolling walker (2 wheeled) Gait Pattern/deviations: Step-through pattern;Decreased step length - left;Decreased stance time - right;Antalgic Gait velocity: decr Gait velocity interpretation: Below normal speed for age/gender General Gait Details: Assist for safety and lines. Pt with decr SpO2 with amb on RA. Replaced O2 with improved SpO2.   Stairs            Wheelchair Mobility    Modified Rankin (Stroke  Patients Only)       Balance Overall balance assessment: Needs assistance Sitting-balance support: No upper extremity supported;Feet supported Sitting balance-Leahy Scale: Normal     Standing balance support: No upper extremity supported;During functional activity Standing balance-Leahy Scale: Good                               Pertinent Vitals/Pain Pain Assessment: Faces Faces Pain Scale: Hurts little more Pain Location: rt foot Pain Descriptors / Indicators: Grimacing Pain Intervention(s): Limited activity within patient's tolerance;Monitored during session    Home Living Family/patient expects to be discharged to:: Private residence   Available Help at Discharge: Family;Friend(s);Available PRN/intermittently Type of Home: House Home Access: Stairs to enter Entrance Stairs-Rails: Psychiatric nurse of Steps: several Home Layout: One level Home Equipment: Cane - single point      Prior Function Level of Independence: Independent with assistive device(s)         Comments: Uses cane at times     Hand Dominance        Extremity/Trunk Assessment   Upper Extremity Assessment Upper Extremity Assessment: Overall WFL for tasks assessed    Lower Extremity Assessment Lower Extremity Assessment: Generalized weakness       Communication   Communication: No difficulties  Cognition Arousal/Alertness: Awake/alert Behavior During Therapy: Impulsive Overall Cognitive Status: No family/caregiver present to determine baseline cognitive functioning Area of Impairment: Safety/judgement;Attention;Problem solving;Memory                   Current Attention Level: Sustained  Memory: Decreased short-term memory   Safety/Judgement: Decreased awareness of safety;Decreased awareness of deficits   Problem Solving: Requires verbal cues;Requires tactile cues General Comments: Pt doesn't seem to have very poor understanding of her medical  issues.       General Comments      Exercises     Assessment/Plan    PT Assessment Patient needs continued PT services  PT Problem List Decreased strength;Decreased activity tolerance;Decreased balance;Decreased cognition;Decreased mobility;Decreased knowledge of use of DME;Decreased safety awareness;Obesity       PT Treatment Interventions DME instruction;Gait training;Functional mobility training;Therapeutic activities;Therapeutic exercise;Balance training;Patient/family education    PT Goals (Current goals can be found in the Care Plan section)  Acute Rehab PT Goals Patient Stated Goal: go home PT Goal Formulation: With patient Time For Goal Achievement: 09/15/17 Potential to Achieve Goals: Good    Frequency Min 3X/week   Barriers to discharge Inaccessible home environment;Decreased caregiver support Stairs to enter home. Lives alone vs with roommate. Poor understanding of medical issues    Co-evaluation               AM-PAC PT "6 Clicks" Daily Activity  Outcome Measure Difficulty turning over in bed (including adjusting bedclothes, sheets and blankets)?: None Difficulty moving from lying on back to sitting on the side of the bed? : None Difficulty sitting down on and standing up from a chair with arms (e.g., wheelchair, bedside commode, etc,.)?: A Little Help needed moving to and from a bed to chair (including a wheelchair)?: A Little Help needed walking in hospital room?: A Little Help needed climbing 3-5 steps with a railing? : A Little 6 Click Score: 20    End of Session Equipment Utilized During Treatment: Gait belt;Oxygen Activity Tolerance: Patient tolerated treatment well Patient left: in chair;with call bell/phone within reach Nurse Communication: Mobility status PT Visit Diagnosis: Unsteadiness on feet (R26.81);Other abnormalities of gait and mobility (R26.89)    Time: 6226-3335 PT Time Calculation (min) (ACUTE ONLY): 29 min   Charges:   PT  Evaluation $PT Eval Moderate Complexity: 1 Mod PT Treatments $Gait Training: 8-22 mins   PT G Codes:        [facility] PT [place] 09/08/2017, 11:52 AM

## 2017-09-08 NOTE — Consult Note (Addendum)
   [facility] Inpatient Consult   09/08/2017  [name] [name] 1946/05/04 [id number]    [facility] Care Management follow up.  Went back to bedside to speak with [name] [name] about [facility] Management services.   Bedside conversation was brief, as she appeared somewhat short of breath.  Explained [facility] Care Management program services and written consent obtained. [facility] Care Management folder provided.  [name] [name] states her friend lives with her.  Denies having concerns with taking medications. However, noted questionable medication adherence in chart. Denies concerns with transportation. Confirmed Primary Care MD is Dr. Lysle Rubens. Writer will notify PCP contact to make aware [place] Management will follow post discharge. Confirmed best contact number as [phone].  Explained [facility] Care Management will not interfere or replace services provided by home health.  Made inpatient RNCM aware [facility] Care Management to follow post discharge.  Will refer to [place] for follow up and request [facility] Pharmacist referral for medication adherence.  [name] [name] has had 3 hospitalizations in the past 6 months. History of CAD, CABG, HTN, afib, CHF, cardiomyopathy, cognitive impairment.   Marthenia Rolling, MSN-Ed, RN,BSN [facility] Liaison [phone]

## 2017-09-09 LAB — URIC ACID: Uric Acid, Serum: 10.5 mg/dL — ABNORMAL HIGH (ref 2.3–6.6)

## 2017-09-09 LAB — BASIC METABOLIC PANEL
Anion gap: 12 (ref 5–15)
BUN: 16 mg/dL (ref 6–20)
CO2: 22 mmol/L (ref 22–32)
Calcium: 8.9 mg/dL (ref 8.9–10.3)
Chloride: 107 mmol/L (ref 101–111)
Creatinine, Ser: 1.25 mg/dL — ABNORMAL HIGH (ref 0.44–1.00)
GFR calc Af Amer: 49 mL/min — ABNORMAL LOW (ref >60)
GFR calc non Af Amer: 42 mL/min — ABNORMAL LOW (ref >60)
Glucose, Bld: 190 mg/dL — ABNORMAL HIGH (ref 65–99)
Potassium: 3.8 mmol/L (ref 3.5–5.1)
Sodium: 141 mmol/L (ref 135–145)

## 2017-09-09 MED ORDER — MIDAZOLAM HCL 2 MG/2ML IJ SOLN: 1.0000 mg | INTRAMUSCULAR | Status: AC

## 2017-09-09 MED ORDER — FUROSEMIDE 10 MG/ML IJ SOLN
80.0000 mg | Freq: Three times a day (TID) | INTRAMUSCULAR | Status: DC
  Administered 2017-09-09 – 2017-09-12 (×9): 80 mg via INTRAVENOUS
  Filled 2017-09-09 (×9): qty 8

## 2017-09-09 MED ORDER — HALOPERIDOL LACTATE 5 MG/ML IJ SOLN
2.5000 mg | Freq: Four times a day (QID) | INTRAMUSCULAR | Status: DC | PRN
  Administered 2017-09-15 – 2017-09-20 (×11): 2.5 mg via INTRAVENOUS
  Filled 2017-09-09 (×12): qty 1

## 2017-09-09 MED ORDER — HALOPERIDOL LACTATE 5 MG/ML IJ SOLN
INTRAMUSCULAR | Status: AC
  Administered 2017-09-09: 5 mg
  Filled 2017-09-09: qty 1

## 2017-09-09 MED ORDER — IPRATROPIUM-ALBUTEROL 0.5-2.5 (3) MG/3ML IN SOLN
3.0000 mL | Freq: Four times a day (QID) | RESPIRATORY_TRACT | Status: DC
  Administered 2017-09-10: 3 mL via RESPIRATORY_TRACT
  Filled 2017-09-09 (×2): qty 3

## 2017-09-09 NOTE — Progress Notes (Signed)
Patient  Refused Bipap. Oxugen 5L on. NT in the room.

## 2017-09-09 NOTE — Significant Event (Addendum)
Rapid Response Event Note  Overview: Called by RT, concern for patient with increased SOB and low saturations.   Initial Focused Assessment: Per RT, she went to place the patient on BIPAP (which patient has been wearing at night) and patient refused.  Patient was very confused, agitated, and delirious.   When I arrived, patient was hypoxic, sats in the upper 60s - low 70s, she was not wearing her oxygen, she would not let me assess her and got up out of bed became more labored and short of breath and was very agitated. I was very concerned that she would further decompensate from a respiratory standpoint  I called CARDS MD on call.   Interventions: -- Haldol 2.5mg  IV -- Versed 1mg  IV if needed -- Primary RN called family, patient's daughter was able to calm patient down and patient was placed on BIPAP and given Haldol 2.5mg  IV. WOB and SOB improved and oxygen saturation maintaining greater than 99%.   Plan of Care (if not transferred): -- Will leave patient on BIPAP for now -- I called MD and updated him as well -- Versed was not given  Event Summary: -- Call Time 2231 -- Arrival Time 2235 -- End Time 2320  Yadira Hada, [place]

## 2017-09-09 NOTE — Progress Notes (Signed)
Pt is confused and short of breath and RT called rapid response to get second opinion about SOB and confusion. Rapid response nurse is at bedside and assessing the pt at this time.

## 2017-09-09 NOTE — Progress Notes (Signed)
Patient became very agitated, took oxygen off, refusing RT and RRT to put Oxygen on or Bipap. Patient took her bag and walked out of the room with her pocket book that she is leaving the hospital.Nurse called daughter Olin Hauser to let her know what is going on.Daughter able to convince patient to wear her Oxygen.  RRT nurse able to put back Bipap after talking to daughter Olin Hauser.

## 2017-09-09 NOTE — Progress Notes (Signed)
Progress Note  Patient Name: [name] [name] Date of Encounter: 09/09/2017  Primary Cardiologist: [name], MD   Subjective  Complains of  SOB just sitting in her chair  Inpatient Medications    Scheduled Meds: . atorvastatin  10 mg Oral q1800  . carvedilol  25 mg Oral BID WC  . diltiazem  360 mg Oral Daily  . famotidine  20 mg Oral Daily  . febuxostat  40 mg Oral Daily  . furosemide  80 mg Intravenous BID  . hydrALAZINE  25 mg Oral Q8H  . magnesium oxide  400 mg Oral TID  . potassium chloride SA  80 mEq Oral BID  . rivaroxaban  20 mg Oral Q supper  . sodium chloride flush  3 mL Intravenous Q12H   Continuous Infusions: . sodium chloride     PRN Meds: sodium chloride, acetaminophen, alum & mag hydroxide-simeth, sodium chloride flush   Vital Signs    Vitals:   09/09/17 0008 09/09/17 0359 09/09/17 0400 09/09/17 0738  BP: 111/68 110/68 114/79 124/85  Pulse: 93 81 79 93  Resp: 20 17 (!) 29 20  Temp: 97.8 F (36.6 C) 98 F (36.7 C)  (!) 97.5 F (36.4 C)  TempSrc: Oral Oral  Oral  SpO2: 97% 97% 95% 97%  Weight:  211 lb 11.2 oz (96 kg)    Height:        Intake/Output Summary (Last 24 hours) at 09/09/2017 1109 Last data filed at 09/09/2017 0400 Gross per 24 hour  Intake 784 ml  Output 725 ml  Net 59 ml   Filed Weights   09/07/17 0434 09/08/17 0629 09/09/17 0359  Weight: 220 lb 12.8 oz (100.2 kg) 214 lb 9.6 oz (97.3 kg) 211 lb 11.2 oz (96 kg)    Telemetry     - Personally Reviewed  ECG    No new EKG to review - Personally Reviewed  Physical Exam   GEN: No acute distress.   Neck: No JVD Cardiac: RRR, no murmurs, rubs, or gallops.  Respiratory: Clear to auscultation bilaterally. GI: Soft, nontender, non-distended  MS: No edema; No deformity. Neuro:  Nonfocal  Psych: Normal affect   Labs    Chemistry Recent Labs  Lab 09/07/17 0216 09/08/17 0647 09/09/17 0344  NA 140 143 141  K 3.9 4.5 3.8  CL 107 108 107  CO2 23 23 22   GLUCOSE 133*  118* 190*  BUN 16 18 16   CREATININE 1.25* 1.21* 1.25*  CALCIUM 8.6* 9.5 8.9  GFRNONAA 42* 44* 42*  GFRAA 49* 51* 49*  ANIONGAP 10 12 12      Hematology Recent Labs  Lab 09/05/17 0815  WBC 8.4  RBC 5.23*  HGB 15.1*  HCT 45.4  MCV 86.8  MCH 28.9  MCHC 33.3  RDW 16.8*  PLT 130*    Cardiac Enzymes Recent Labs  Lab 09/05/17 0815 09/05/17 1641 09/06/17 0734  TROPONINI 0.03* 0.03* 0.03*   No results for input(s): TROPIPOC in the last 168 hours.   BNPNo results for input(s): BNP, PROBNP in the last 168 hours.   DDimer No results for input(s): DDIMER in the last 168 hours.   Radiology    No results found.  Cardiac Studies   None this admit  Patient Profile     72 y.o. female with a history of CAD s/p CABG in 2016, Ischemic CM with improvement in EF to normal post CABG, atrial fibrillation w/CHA2DS2 VASc score of 5, s/p LAA clipping and is  on chronic OAC w/ Xarelto, HTN and questionable med compliance, presenting to the ED with CC ofdyspneaand palpitations, found to be in atrial fibrillation w/ RVR and acute on chronic diastolic CHF.  Assessment & Plan     1. Atrial fibrillation with rapid ventricular response: Likely due to medication noncompliance.  Appreciate EP input.  They feel that due to her medication noncompliance, she would not be an ablation candidate at this time.  It is felt that given her noncompliance with meds and only intermittently taking her NOAC, she would not be a candidate for Tikosyn or Amio as she would need to be compliant with anticoagulation.    - will continue with rate control.  - continue Cardizem CD 360mg  daily and Carvedilol 25mg  BID - Should her rate control continue to be an issue, she may benefit from AV nodal ablation and pacemaker implantation.  2. Acute on chronic diastolic heart failure:  - Continues to be volume overloaded.   - she put out 725cc and is net neg 721cc.   - weight is down 11lbs despite not much output so ?  Accuracy of I&Os or weight - as she remains volume overloaded will increase Lasix to 80mg  IV TID  3.  Coronary artery disease status post CABG in 2016 - denies chest pain.   - continue statin and BB - no ASA due to NOAC   For questions or updates, please contact [place] Please consult [email or web address] for contact info under Cardiology/STEMI.      Signed, Fransico Him, MD  09/09/2017, 11:09 AM

## 2017-09-09 NOTE — Progress Notes (Signed)
Daughter Olin Hauser called concerning patient refusal of care and oxygen RRT and MD notfied.

## 2017-09-10 LAB — BASIC METABOLIC PANEL
Anion gap: 11 (ref 5–15)
BUN: 17 mg/dL (ref 6–20)
CO2: 22 mmol/L (ref 22–32)
Calcium: 8.9 mg/dL (ref 8.9–10.3)
Chloride: 108 mmol/L (ref 101–111)
Creatinine, Ser: 1.21 mg/dL — ABNORMAL HIGH (ref 0.44–1.00)
GFR calc Af Amer: 51 mL/min — ABNORMAL LOW (ref >60)
GFR calc non Af Amer: 44 mL/min — ABNORMAL LOW (ref >60)
Glucose, Bld: 121 mg/dL — ABNORMAL HIGH (ref 65–99)
Potassium: 4.5 mmol/L (ref 3.5–5.1)
Sodium: 141 mmol/L (ref 135–145)

## 2017-09-10 MED ORDER — IPRATROPIUM-ALBUTEROL 0.5-2.5 (3) MG/3ML IN SOLN
3.0000 mL | Freq: Four times a day (QID) | RESPIRATORY_TRACT | Status: DC | PRN
  Administered 2017-09-11: 3 mL via RESPIRATORY_TRACT
  Filled 2017-09-10: qty 3

## 2017-09-10 NOTE — Progress Notes (Signed)
Progress Note  Patient Name: [name] [name] Date of Encounter: 09/10/2017  Primary Cardiologist: [name], MD   Subjective   Still SOB.  HR still in the 100's when sitting up in bed  Inpatient Medications    Scheduled Meds: . atorvastatin  10 mg Oral q1800  . carvedilol  25 mg Oral BID WC  . diltiazem  360 mg Oral Daily  . famotidine  20 mg Oral Daily  . febuxostat  40 mg Oral Daily  . furosemide  80 mg Intravenous Q8H  . hydrALAZINE  25 mg Oral Q8H  . magnesium oxide  400 mg Oral TID  . midazolam  1 mg Intravenous STAT  . potassium chloride SA  80 mEq Oral BID  . rivaroxaban  20 mg Oral Q supper  . sodium chloride flush  3 mL Intravenous Q12H   Continuous Infusions: . sodium chloride     PRN Meds: sodium chloride, acetaminophen, alum & mag hydroxide-simeth, haloperidol lactate, ipratropium-albuterol, sodium chloride flush   Vital Signs    Vitals:   09/09/17 2325 09/10/17 0355 09/10/17 0817 09/10/17 0831  BP: 124/85 112/70 113/79   Pulse: 81  83   Resp: 14 (!) 22 17   Temp: (!) 96.7 F (35.9 C) 97.7 F (36.5 C) 97.7 F (36.5 C)   TempSrc: Axillary Oral Oral   SpO2: 100% 99% 100% 98%  Weight:  207 lb 12.8 oz (94.3 kg)    Height:        Intake/Output Summary (Last 24 hours) at 09/10/2017 1121 Last data filed at 09/10/2017 0700 Gross per 24 hour  Intake 450 ml  Output 2650 ml  Net -2200 ml   Filed Weights   09/08/17 0629 09/09/17 0359 09/10/17 0355  Weight: 214 lb 9.6 oz (97.3 kg) 211 lb 11.2 oz (96 kg) 207 lb 12.8 oz (94.3 kg)    Telemetry    Atrial fibrillation with intermittent HR in the low 100's - Personally Reviewed  ECG    No new EKG to review - Personally Reviewed  Physical Exam   GEN: No acute distress.   Neck: No JVD Cardiac: irregularly irregular, no murmurs, rubs, or gallops.  Respiratory: decrased BS at bases with a few crackles GI: Soft, nontender, non-distended  MS: No edema; No deformity. Neuro:  Nonfocal  Psych:  Normal affect   Labs    Chemistry Recent Labs  Lab 09/08/17 0647 09/09/17 0344 09/10/17 0334  NA 143 141 141  K 4.5 3.8 4.5  CL 108 107 108  CO2 23 22 22   GLUCOSE 118* 190* 121*  BUN 18 16 17   CREATININE 1.21* 1.25* 1.21*  CALCIUM 9.5 8.9 8.9  GFRNONAA 44* 42* 44*  GFRAA 51* 49* 51*  ANIONGAP 12 12 11      Hematology Recent Labs  Lab 09/05/17 0815  WBC 8.4  RBC 5.23*  HGB 15.1*  HCT 45.4  MCV 86.8  MCH 28.9  MCHC 33.3  RDW 16.8*  PLT 130*    Cardiac Enzymes Recent Labs  Lab 09/05/17 0815 09/05/17 1641 09/06/17 0734  TROPONINI 0.03* 0.03* 0.03*   No results for input(s): TROPIPOC in the last 168 hours.   BNPNo results for input(s): BNP, PROBNP in the last 168 hours.   DDimer No results for input(s): DDIMER in the last 168 hours.   Radiology    No results found.  Cardiac Studies   None this admit  Patient Profile     72 y.o. female with a  history of CAD s/p CABG in 2016, Ischemic CM with improvement in EF to normal post CABG, atrial fibrillation w/CHA2DS2 VASc score of 5, s/p LAA clipping and is on chronic OAC w/ Xarelto, HTN and questionable med compliance, presenting to the ED with CC ofdyspneaand palpitations, found to be in atrial fibrillation w/ RVR and acute on chronic diastolic CHF.  Assessment & Plan    1. Atrial fibrillation with rapid ventricular response: Likely due to medication noncompliance. Appreciate EP input.  They feel that due to her medication noncompliance, she would not be an ablation candidate at this time. It is felt that given her noncompliance with meds and only intermittently taking her NOAC, she would not be a candidate for Tikosyn or Amio as she would need to be compliant with anticoagulation.   - will continue with rate control. HR controlled at rest but still gets up in the low 100's with movement - continue Cardizem CD 360mg  daily and Carvedilol 25mg  BID - Should her rate control continue to be an issue, she may  benefit from AV nodal ablation and pacemaker implantation. - continue Xarelto 20mg  daily.   2. Acute on chronic diastolic heart failure:  - Continues to be volume overloaded.  - she put out 2.65L and is net neg 2.9 and increasing Lasix yesterday.   - weight is down 4lbs from yesterday and 15lbs from admit - as she remains volume overloaded will continue  Lasix to 80mg  IV TID - follow renal function closely.  Creatinine today is stable at 1.21 and K+ 4.5  3. Coronary artery disease status post CABG in 2016 - denies chest pain.  - continue atorvastatin 10mg  daily and Carvedilol 25mg  BID. - no ASA due to NOAC   For questions or updates, please contact [place] Please consult [email or web address] for contact info under Cardiology/STEMI.      Signed, Fransico Him, MD  09/10/2017, 11:21 AM

## 2017-09-11 MED ORDER — FAMOTIDINE 20 MG PO TABS
20.0000 mg | ORAL_TABLET | Freq: Once | ORAL | Status: AC
  Administered 2017-09-11: 20 mg via ORAL
  Filled 2017-09-11: qty 1

## 2017-09-11 NOTE — Evaluation (Signed)
Physical Therapy Evaluation Patient Details Name: [name] [name] MRN: [id number] DOB: Dec 20, 1945 Today's Date: 09/11/2017   History of Present Illness  Pt adm with afib with rvr and acute heart failure. Pt also developed painful rt foot. PMH - CAD, s/p CABG, parox afib, HTN, ICM, gout  Clinical Impression  Pt is up to walk with assistance and noted her strength is decreased in LE's as well as her balance improved from last week.  Her family is there to assist her and should be able to give her support to get home safely.  Pt would like to take a rollator 4WW home rather than the RW and with her current functional performance and need for O2 this seems reasonable to PT.  Follow acutely for strength and gait as tolerated pending dc.    Follow Up Recommendations Home health PT    Equipment Recommendations  Rolling walker with 5" wheels(pt would like to be considered for rollator )    Recommendations for Other Services       Precautions / Restrictions Precautions Precautions: Fall Restrictions Weight Bearing Restrictions: No      Mobility  Bed Mobility               General bed mobility comments: Pt up in chair  Transfers Overall transfer level: Needs assistance Equipment used: Rolling walker (2 wheeled);None Transfers: Sit to/from Stand Sit to Stand: Supervision;Min guard         General transfer comment: cued for safety with telemetry lines and O2  Ambulation/Gait Ambulation/Gait assistance: Min guard;Supervision Ambulation Distance (Feet): 100 Feet Assistive device: Rolling walker (2 wheeled) Gait Pattern/deviations: Step-through pattern;Decreased stride length;Wide base of support Gait velocity: decr Gait velocity interpretation: Below normal speed for age/gender General Gait Details: used 4L O2 and sat dropped from 100% to 92%  Stairs            Wheelchair Mobility    Modified Rankin (Stroke Patients Only)       Balance Overall balance  assessment: Needs assistance Sitting-balance support: Feet supported Sitting balance-Leahy Scale: Good     Standing balance support: Bilateral upper extremity supported;During functional activity Standing balance-Leahy Scale: Good                               Pertinent Vitals/Pain Pain Assessment: No/denies pain    Home Living                        Prior Function                 Hand Dominance        Extremity/Trunk Assessment                Communication      Cognition Arousal/Alertness: Awake/alert Behavior During Therapy: Impulsive Overall Cognitive Status: Impaired/Different from baseline Area of Impairment: Following commands;Safety/judgement;Awareness;Problem solving                       Following Commands: Follows one step commands inconsistently;Follows one step commands with increased time Safety/Judgement: Decreased awareness of safety;Decreased awareness of deficits Awareness: Intellectual Problem Solving: Requires verbal cues;Requires tactile cues General Comments: pt is not attending to instructions but family is not as well      General Comments      Exercises     Assessment/Plan    PT Assessment    PT Problem List  PT Treatment Interventions      PT Goals (Current goals can be found in the Care Plan section)  Acute Rehab PT Goals Patient Stated Goal: go home    Frequency Min 3X/week   Barriers to discharge        Co-evaluation               AM-PAC PT "6 Clicks" Daily Activity  Outcome Measure Difficulty turning over in bed (including adjusting bedclothes, sheets and blankets)?: None Difficulty moving from lying on back to sitting on the side of the bed? : None Difficulty sitting down on and standing up from a chair with arms (e.g., wheelchair, bedside commode, etc,.)?: None Help needed moving to and from a bed to chair (including a wheelchair)?: A Little Help needed  walking in hospital room?: A Little Help needed climbing 3-5 steps with a railing? : A Little 6 Click Score: 21    End of Session Equipment Utilized During Treatment: Gait belt;Oxygen Activity Tolerance: Patient tolerated treatment well;Treatment limited secondary to medical complications (Comment) Patient left: in chair;with call bell/phone within reach;with nursing/sitter in room;with family/visitor present Nurse Communication: Mobility status PT Visit Diagnosis: Unsteadiness on feet (R26.81);Muscle weakness (generalized) (M62.81);Difficulty in walking, not elsewhere classified (R26.2)    Time: 4944-9675 PT Time Calculation (min) (ACUTE ONLY): 20 min   Charges:     PT Treatments $Gait Training: 8-22 mins   PT G Codes:   PT G-Codes **NOT FOR INPATIENT CLASS** Functional Assessment Tool Used: AM-PAC 6 Clicks Basic Mobility   Ramond Dial 09/11/2017, 11:40 AM   Mee Hives, PT MS Acute Rehab Dept. Number: [place] [facility] [place]

## 2017-09-11 NOTE — Progress Notes (Signed)
Progress Note  Patient Name: [name] [name] Date of Encounter: 09/11/2017  Primary Cardiologist: [name], MD   Subjective   72 year old female with a history of coronary artery disease, status post coronary artery bypass grafting in 2016, ischemic cardia myopathy, atrial fibrillation, hypertension who was seen in consultation on January 24 for atrial fibrillation with a rapid ventricular response.  She has been seen in the A. fib clinic and has been considered for Tikosyn  She has problems with medication compliance apparently.  She also was found to have acute on chronic diastolic dysfunction.  She is been diuresed 3.3 L and is feeling better.  Inpatient Medications    Scheduled Meds: . atorvastatin  10 mg Oral q1800  . carvedilol  25 mg Oral BID WC  . diltiazem  360 mg Oral Daily  . famotidine  20 mg Oral Daily  . febuxostat  40 mg Oral Daily  . furosemide  80 mg Intravenous Q8H  . hydrALAZINE  25 mg Oral Q8H  . magnesium oxide  400 mg Oral TID  . potassium chloride SA  80 mEq Oral BID  . rivaroxaban  20 mg Oral Q supper  . sodium chloride flush  3 mL Intravenous Q12H   Continuous Infusions: . sodium chloride     PRN Meds: sodium chloride, acetaminophen, alum & mag hydroxide-simeth, haloperidol lactate, ipratropium-albuterol, sodium chloride flush   Vital Signs    Vitals:   09/11/17 0006 09/11/17 0403 09/11/17 0924 09/11/17 1004  BP: 127/87 (!) 115/93 118/81 118/81  Pulse: 79 82  89  Resp: (!) 23 (!) 21  19  Temp: 98 F (36.7 C) (!) 97.3 F (36.3 C)    TempSrc: Oral Oral    SpO2: 100% 97%  96%  Weight:  203 lb 8 oz (92.3 kg)    Height:        Intake/Output Summary (Last 24 hours) at 09/11/2017 1007 Last data filed at 09/11/2017 0900 Gross per 24 hour  Intake 960 ml  Output 1400 ml  Net -440 ml   Filed Weights   09/09/17 0359 09/10/17 0355 09/11/17 0403  Weight: 211 lb 11.2 oz (96 kg) 207 lb 12.8 oz (94.3 kg) 203 lb 8 oz (92.3 kg)    Telemetry      Atrial fibrillation- Personally Reviewed  ECG    Atrial fibrillation with rapid ventricular response- Personally Reviewed  Physical Exam   GEN: No acute distress.   Neck: No JVD Cardiac: Irreg. Irreg.  Respiratory: Clear to auscultation bilaterally. GI: Soft, nontender, non-distended  MS: No edema; No deformity. Neuro:  Nonfocal  Psych: Normal affect   Labs    Chemistry Recent Labs  Lab 09/08/17 0647 09/09/17 0344 09/10/17 0334  NA 143 141 141  K 4.5 3.8 4.5  CL 108 107 108  CO2 23 22 22   GLUCOSE 118* 190* 121*  BUN 18 16 17   CREATININE 1.21* 1.25* 1.21*  CALCIUM 9.5 8.9 8.9  GFRNONAA 44* 42* 44*  GFRAA 51* 49* 51*  ANIONGAP 12 12 11      Hematology Recent Labs  Lab 09/05/17 0815  WBC 8.4  RBC 5.23*  HGB 15.1*  HCT 45.4  MCV 86.8  MCH 28.9  MCHC 33.3  RDW 16.8*  PLT 130*    Cardiac Enzymes Recent Labs  Lab 09/05/17 0815 09/05/17 1641 09/06/17 0734  TROPONINI 0.03* 0.03* 0.03*   No results for input(s): TROPIPOC in the last 168 hours.   BNPNo results for input(s): BNP,  PROBNP in the last 168 hours.   DDimer No results for input(s): DDIMER in the last 168 hours.   Radiology    No results found.  Cardiac Studies     Patient Profile     72 y.o. female   [place]    1.  Persistent atrial fibrillation: Heart rate is now well controlled.  She is on Xarelto 20 mg a day.  She is not a good candidate for ablation or amiodarone therapy because of her medication noncompliance.  She will  routinely miss doses of her anticoagulation.  I have stressed the importance of taking the anticoagulation on a regular and consistent basis.  She has been up ambulating without too much difficulty.  2.  Acute on chronic diastolic congestive heart failure: She is been diuresed and is feeling much better.  She needs to eat a low-salt diet. Continue current dose of Lasix.  Transition to p.o. Lasix soon.  3.  Coronary artery disease: She is not  having any episodes of angina.   For questions or updates, please contact [place] Please consult [email or web address] for contact info under Cardiology/STEMI.      Signed, Mertie Moores, MD  09/11/2017, 10:07 AM

## 2017-09-12 LAB — BASIC METABOLIC PANEL
Anion gap: 12 (ref 5–15)
BUN: 16 mg/dL (ref 6–20)
CO2: 26 mmol/L (ref 22–32)
Calcium: 9.6 mg/dL (ref 8.9–10.3)
Chloride: 105 mmol/L (ref 101–111)
Creatinine, Ser: 1.27 mg/dL — ABNORMAL HIGH (ref 0.44–1.00)
GFR calc Af Amer: 48 mL/min — ABNORMAL LOW (ref >60)
GFR calc non Af Amer: 41 mL/min — ABNORMAL LOW (ref >60)
Glucose, Bld: 125 mg/dL — ABNORMAL HIGH (ref 65–99)
Potassium: 3.7 mmol/L (ref 3.5–5.1)
Sodium: 143 mmol/L (ref 135–145)

## 2017-09-12 MED ORDER — TORSEMIDE 20 MG PO TABS: 40.0000 mg | ORAL_TABLET | Freq: Every day | ORAL | Status: DC

## 2017-09-12 MED ORDER — POTASSIUM CHLORIDE CRYS ER 20 MEQ PO TBCR: 40.0000 meq | EXTENDED_RELEASE_TABLET | Freq: Two times a day (BID) | ORAL | Status: DC

## 2017-09-12 MED ORDER — TORSEMIDE 20 MG PO TABS
40.0000 mg | ORAL_TABLET | Freq: Two times a day (BID) | ORAL | Status: DC
  Administered 2017-09-12 – 2017-09-14 (×5): 40 mg via ORAL
  Filled 2017-09-12 (×6): qty 2

## 2017-09-12 MED ORDER — POTASSIUM CHLORIDE CRYS ER 20 MEQ PO TBCR
40.0000 meq | EXTENDED_RELEASE_TABLET | Freq: Two times a day (BID) | ORAL | Status: DC
  Administered 2017-09-12 – 2017-09-16 (×9): 40 meq via ORAL
  Filled 2017-09-12 (×9): qty 2

## 2017-09-12 NOTE — Progress Notes (Addendum)
Progress Note  Patient Name: [name] [name] Date of Encounter: 09/12/2017  Primary Cardiologist: [name], MD   Subjective   72 year old female with a history of coronary artery disease, status post coronary artery bypass grafting in 2016, ischemic cardia myopathy, atrial fibrillation, hypertension who was seen in consultation on January 24 for atrial fibrillation with a rapid ventricular response.  She has been seen in the A. fib clinic and has been considered for Tikosyn  She has problems with medication compliance apparently.  She also was found to have acute on chronic diastolic dysfunction.      Inpatient Medications    Scheduled Meds: . atorvastatin  10 mg Oral q1800  . carvedilol  25 mg Oral BID WC  . diltiazem  360 mg Oral Daily  . famotidine  20 mg Oral Daily  . febuxostat  40 mg Oral Daily  . furosemide  80 mg Intravenous Q8H  . hydrALAZINE  25 mg Oral Q8H  . magnesium oxide  400 mg Oral TID  . potassium chloride SA  80 mEq Oral BID  . rivaroxaban  20 mg Oral Q supper  . sodium chloride flush  3 mL Intravenous Q12H   Continuous Infusions: . sodium chloride     PRN Meds: sodium chloride, acetaminophen, alum & mag hydroxide-simeth, haloperidol lactate, ipratropium-albuterol, sodium chloride flush   Vital Signs    Vitals:   09/11/17 2032 09/12/17 0017 09/12/17 0330 09/12/17 0713  BP: 114/77 (!) 122/97 118/67 121/72  Pulse: 82 88 89 94  Resp: (!) 22 (!) 22 (!) 22 17  Temp: 97.9 F (36.6 C) 97.8 F (36.6 C) (!) 97.3 F (36.3 C) 97.6 F (36.4 C)  TempSrc: Oral Oral Oral Oral  SpO2: 98% 99% 96% 98%  Weight:   201 lb 4.8 oz (91.3 kg)   Height:        Intake/Output Summary (Last 24 hours) at 09/12/2017 1006 Last data filed at 09/12/2017 0811 Gross per 24 hour  Intake 1050 ml  Output 6200 ml  Net -5150 ml   Filed Weights   09/10/17 0355 09/11/17 0403 09/12/17 0330  Weight: 207 lb 12.8 oz (94.3 kg) 203 lb 8 oz (92.3 kg) 201 lb 4.8 oz (91.3 kg)     Telemetry    Atrial fibrillation with a well controlled ventricular response.- Personally Reviewed  ECG  Atrial ablation with a rapid ventricular response.- Personally Reviewed  Physical Exam   Physical Exam: Blood pressure 121/72, pulse 94, temperature 97.6 F (36.4 C), temperature source Oral, resp. rate 17, height 5\' 7"  (1.702 m), weight 201 lb 4.8 oz (91.3 kg), SpO2 98 %.  GEN:  Well nourished, well developed in no acute distress HEENT: Normal NECK: No JVD; No carotid bruits LYMPHATICS: No lymphadenopathy CARDIAC: Irregularly irregular.  Her heart rate is well controlled. RESPIRATORY:  Clear to auscultation without rales, wheezing or rhonchi  ABDOMEN: Soft, non-tender, non-distended MUSCULOSKELETAL:  No edema; No deformity  SKIN: Warm and dry NEUROLOGIC:  Alert and oriented x 3   Labs    Chemistry Recent Labs  Lab 09/08/17 0647 09/09/17 0344 09/10/17 0334  NA 143 141 141  K 4.5 3.8 4.5  CL 108 107 108  CO2 23 22 22   GLUCOSE 118* 190* 121*  BUN 18 16 17   CREATININE 1.21* 1.25* 1.21*  CALCIUM 9.5 8.9 8.9  GFRNONAA 44* 42* 44*  GFRAA 51* 49* 51*  ANIONGAP 12 12 11      Hematology No results for input(s): WBC,  RBC, HGB, HCT, MCV, MCH, MCHC, RDW, PLT in the last 168 hours.  Cardiac Enzymes Recent Labs  Lab 09/05/17 1641 09/06/17 0734  TROPONINI 0.03* 0.03*   No results for input(s): TROPIPOC in the last 168 hours.   BNPNo results for input(s): BNP, PROBNP in the last 168 hours.   DDimer No results for input(s): DDIMER in the last 168 hours.   Radiology    No results found.  Cardiac Studies     Patient Profile     72 y.o. female   [place]    1.  Persistent atrial fibrillation: .  Her heart rate remains well controlled.  Continue current anticoagulation.  2.  Acute on chronic diastolic congestive heart failure:  She is diuresed 8.5 L so far this admission.  She seems to be doing better. We will check a basic metabolic profile  today. Change the IV lasix to PO Torsemide 40 mg BID  Reduce Kdur to 40 meq BID Consider adding Aldactone as OP and hopefully will be able to reduce her Kdur doses   Pt has discussed changing cardiologist. She would like to follow up with me   3.  Coronary artery disease: She is not had any episodes of angina.  I anticipate that she will be able to go home soon. Will ambulate today  Possible DC tomorrow if she continues to do well.    For questions or updates, please contact [place] Please consult [email or web address] for contact info under Cardiology/STEMI.      Signed, Mertie Moores, MD  09/12/2017, 10:06 AM

## 2017-09-13 ENCOUNTER — Encounter (HOSPITAL_COMMUNITY): Payer: Self-pay | Admitting: Cardiology

## 2017-09-13 LAB — BASIC METABOLIC PANEL
Anion gap: 13 (ref 5–15)
BUN: 17 mg/dL (ref 6–20)
CO2: 23 mmol/L (ref 22–32)
Calcium: 9.4 mg/dL (ref 8.9–10.3)
Chloride: 105 mmol/L (ref 101–111)
Creatinine, Ser: 1.3 mg/dL — ABNORMAL HIGH (ref 0.44–1.00)
GFR calc Af Amer: 46 mL/min — ABNORMAL LOW (ref >60)
GFR calc non Af Amer: 40 mL/min — ABNORMAL LOW (ref >60)
Glucose, Bld: 114 mg/dL — ABNORMAL HIGH (ref 65–99)
Potassium: 4.1 mmol/L (ref 3.5–5.1)
Sodium: 141 mmol/L (ref 135–145)

## 2017-09-13 MED ORDER — TORSEMIDE 20 MG PO TABS: 40.0000 mg | ORAL_TABLET | Freq: Two times a day (BID) | ORAL | 3 refills | Status: DC

## 2017-09-13 MED ORDER — MAGNESIUM OXIDE 400 (241.3 MG) MG PO TABS: 400.0000 mg | ORAL_TABLET | Freq: Three times a day (TID) | ORAL | 3 refills | Status: DC

## 2017-09-13 MED ORDER — DILTIAZEM HCL ER COATED BEADS 360 MG PO CP24: 360.0000 mg | ORAL_CAPSULE | Freq: Every day | ORAL | 3 refills | Status: DC

## 2017-09-13 MED ORDER — HYDRALAZINE HCL 25 MG PO TABS: 25.0000 mg | ORAL_TABLET | Freq: Three times a day (TID) | ORAL | 3 refills | Status: DC

## 2017-09-13 NOTE — Progress Notes (Signed)
    Plan was to send the patient home today, 09/13/17 with [facility] and home health physical therapy. Case management has been working with this pt to help with the transition from hospital to her home. Before discharge today, the patient was ambulating with PT without oxygen and desaturated to 76%. We are aware and feel that given the patients multiple admissions to the hospital over the course of two months, we will keep her one additional night for observation. Per RN case manager, the pt has already been approved from home oxygen if needed at discharge. The order will need to be written.   Thank you  Kathyrn Drown NP-C [facility] Pager: [phone]

## 2017-09-13 NOTE — Care Management Note (Signed)
Case Management Note Marvetta Gibbons RN, BSN Unit 4E-Case Manager [phone]  Patient Details  Name: [name] [name] MRN: [id number] Date of Birth: 12/07/1945  Subjective/Objective:   Pt admitted with afib and HF                 Action/Plan: PTA Pt lived at home, per PT eval recommendation for HHPT- discussed in [facility]/QC mtg on 1/29- per DR. A- pt appropriate for [place] program and placed with [facility] for f/u on discharge.   Expected Discharge Date:  09/13/17               Expected Discharge Plan:  [place]  In-House Referral:  [facility]  Discharge planning Services  CM Consult  Post Acute Care Choice:  Home Health, Durable Medical Equipment Choice offered to:  Patient  DME Arranged:  Walker rolling DME Agency:  [place] Arranged:  RN, Disease Management, PT [place] Agency:  [place]  Status of Service:  Completed, signed off  If discussed at [place] of Stay Meetings, dates discussed:  1/29  Discharge Disposition: home/home health   Additional Comments:  09/13/17- 1430- [name] Bollman RN, CM- pt for d/c home today- HH and DME order placed- spoke with pt at bedside- discussed [place] program with [name] [name]- pt agreeable to services with [facility] for [facility] needs- pt reports that she has a walker at home and does not need new walker at this time- ?home 02 needs- bedside RN Lattie Haw to check with MD regarding home 02.  Dawayne Patricia, RN 09/13/2017, 2:32 PM

## 2017-09-13 NOTE — Progress Notes (Signed)
Physical Therapy Treatment Patient Details Name: [name] [name] MRN: [id number] DOB: 12-Feb-1946 Today's Date: 09/13/2017    History of Present Illness Pt adm with afib with rvr and acute heart failure. Pt also developed painful rt foot. PMH - CAD, s/p CABG, parox afib, HTN, ICM, gout    PT Comments    Pt performed gait and functional mobility with cues for safety.  Pt reports she has intermittent support at home and she is not sure that she needs the rollator as she has a RW at home.  At this time we continue to recommend the 4 wheeled RW with a seat to aide with energy conservation.  Pt does desaturate with activity and required rest breaks during session.  Plan for return home with HHPT to follow up.    Follow Up Recommendations  Home health PT     Equipment Recommendations  (Rollator 4 wheeled walker with seat.  )    Recommendations for Other Services       Precautions / Restrictions Precautions Precautions: Fall Restrictions Weight Bearing Restrictions: No    Mobility  Bed Mobility               General bed mobility comments: Pt in recliner on arrival with telesitter present  Transfers Overall transfer level: Needs assistance Equipment used: Rolling walker (2 wheeled) Transfers: Sit to/from Stand Sit to Stand: Supervision;Min guard Stand pivot transfers: Supervision       General transfer comment: cued for safety with telemetry lines and O2 and hand placement.  Pt required repeated trials for improved eccentric loading when returning to the chair.    Ambulation/Gait Ambulation/Gait assistance: Min guard;Supervision Ambulation Distance (Feet): 140 Feet Assistive device: Rolling walker (2 wheeled) Gait Pattern/deviations: Step-through pattern;Decreased stride length;Wide base of support Gait velocity: decr Gait velocity interpretation: Below normal speed for age/gender General Gait Details: Pt on supplemental O2 2L and maintained saturations above 90%  through with the exception of desaturation x1 to 86%, patient took standing break in hall and O2 sats improved.     Stairs            Wheelchair Mobility    Modified Rankin (Stroke Patients Only)       Balance Overall balance assessment: Needs assistance   Sitting balance-Leahy Scale: Good     Standing balance support: Bilateral upper extremity supported;During functional activity Standing balance-Leahy Scale: Fair                              Cognition Arousal/Alertness: Awake/alert Behavior During Therapy: Impulsive Overall Cognitive Status: Impaired/Different from baseline Area of Impairment: Following commands;Safety/judgement;Awareness;Problem solving                   Current Attention Level: Sustained Memory: Decreased short-term memory Following Commands: Follows one step commands inconsistently;Follows one step commands with increased time Safety/Judgement: Decreased awareness of safety;Decreased awareness of deficits Awareness: Intellectual Problem Solving: Requires verbal cues;Requires tactile cues General Comments: [place] reporting [place] meeting for the first time.        Exercises      General Comments        Pertinent Vitals/Pain Pain Assessment: No/denies pain    Home Living                      Prior Function            PT Goals (current  goals can now be found in the care plan section) Acute Rehab PT Goals Patient Stated Goal: go home Potential to Achieve Goals: Good Progress towards PT goals: Progressing toward goals    Frequency    Min 3X/week      PT Plan Current plan remains appropriate    Co-evaluation              AM-PAC PT "6 Clicks" Daily Activity  Outcome Measure  Difficulty turning over in bed (including adjusting bedclothes, sheets and blankets)?: None Difficulty moving from lying on back to sitting on the side of the bed? : None Difficulty  sitting down on and standing up from a chair with arms (e.g., wheelchair, bedside commode, etc,.)?: Unable Help needed moving to and from a bed to chair (including a wheelchair)?: A Little Help needed walking in hospital room?: A Little Help needed climbing 3-5 steps with a railing? : A Little 6 Click Score: 18    End of Session Equipment Utilized During Treatment: Gait belt;Oxygen Activity Tolerance: Patient tolerated treatment well;Treatment limited secondary to medical complications (Comment) Patient left: in chair;with call bell/phone within reach;with nursing/sitter in room;with family/visitor present Nurse Communication: Mobility status PT Visit Diagnosis: Unsteadiness on feet (R26.81);Muscle weakness (generalized) (M62.81);Difficulty in walking, not elsewhere classified (R26.2)     Time: 9326-7124 PT Time Calculation (min) (ACUTE ONLY): 23 min  Charges:  $Gait Training: 8-22 mins $Therapeutic Activity: 8-22 mins                    G Codes:       [name] [name], PTA pager [phone]    [name] [name] 09/13/2017, 2:00 PM

## 2017-09-13 NOTE — Discharge Summary (Signed)
Discharge Summary    Patient ID: [name] [name],  MRN: [id number], DOB/AGE: 11/23/1945 72 y.o.  Admit date: 09/05/2017 Discharge date: 09/13/2017  Primary Care Provider: [name] Primary Cardiologist: [name], MD  Discharge Diagnoses    Principal Problem:   Atrial fibrillation with RVR [facility]) Active Problems:   Hypokalemia   Essential hypertension   S/P CABG x 2   CKD (chronic kidney disease), stage III ([place])   Cardiomyopathy, ischemic   Acute on chronic diastolic heart failure ([place])   Medication noncompliance due to cognitive impairment   Acute drug-induced gout of right foot  Allergies Allergies  Allergen Reactions  . Bee Venom Anaphylaxis  . Codeine Nausea And Vomiting  . Shrimp [Shellfish Allergy] Swelling  . Tomato     Pt states it gives her gout    Diagnostic Studies/Procedures    None _____________   History of Present Illness     [name] [name] is a 72 y.o. female with a history of  CAD s/p CABG in 2016, ischemic cardiomyopathy with improvement in EF to normal s/p CABG, persistent atrial fibrillation w/ CHA2DS2 VASc score of 5 on Xarelto, s/p LAA clipping, HTN, CKD III and questionable medication compliance who presented to the ED on 09/05/17 with complaints of dyspnea and palpitations, found to be in atrial fibrillation w/ RVR and acute on chronic diastolic CHF.   Of note, the pt has had two separtate admissions in December of 2018 for atrial fibrillation with rapid ventricular response in which there were several adjustments in her medications to achieve greater rate control. After stabilization in the hospital, she was discharged on 07/29/17 with a follow- up appointment with Dr. Rayann Heman on 08/01/17. At that time, her rates were improved and she was left on Diltiazem 240 mg and started on Xarelto 20 mg daily secondary to a CHADSVASC Score of 5 with the thought of trying her on Tikosyn or for a possible ablation. She then had an appointment with the  Afib clinic on 08/04/17 and was found to be back in  atrial fibrillation with rates in the 140's and symptomatic (dyspneic at rest and 5 lb increase in weight). She was admitted to the hospital once again from 08/04/17-08/06/17 and started on diltiazem gtt and given IV lasix for fluid volume management. Once stabilized she was discharged on 08/06/17 on Diltiazem 240 mg daily and Coreg 25 mg BID. There are also outpatient phone notes outlining daughter's concern that patient may not be taking medications appropriately.  On 09/05/17, she presented to the ED back in Afib with RVR. Her EKG showed rates in the 140's on arrival and she was in mild respiratory distress. She was placed on Bipap ventilation. She was hypokalemic at 3.3 and her Mg was 1.8. Her troponin was mildly abnormal at 0.03 (which was peak) with a normal TSH. Her CXR showed mild interstitial edema. She has significant s/s of fluid volume overload on physical exam including bilateral LE swelling. She had no reports of chest pain and reported full medication compliance, although this appears questionable per notes.    Hospital Course     In the ED, she was started on IV Diltiazem with rates improving down to the low 100's. EP was consulted to discuss plans for Tikosyn versus ablation versus medication rate control. Unfortunately, due to her known medication noncompliance, she would not be an ablation nor antiarhtynmic candidate at this time. Her Diltiazem was transitioned from IV to PO and increased to  354m daily, with current rates remaining in the 80's to low 100's. She was started on IV Lasix, up to 854mTID with excellent response. Her weight is down to 197lb from her admission weight of 222lb. She is net negative 9.8L of fluid. Her breathing has improved and she no longer requires any supplemental O2. Of note during admission she was also treated for gout.  On 09/12/17, her IV Lasix was changed to PO Torsemide 4060mID in anticipation of her  discharge. Per MD note, consider adding Aldactone as OP in hopes to be able to reduce her K+ supplementation (K+ 40 meq BID).  On day of discharge Cr was stable at 1.3. Her CrCl was calculated at 72m14mn so current dose of Xarelto is appropriate, but will need to be followed regularly as OP.  There are some questions on whether the [facility] will go ahead an set her up for a follow up in our office as to establish continuity of care for her. If she chooses to transfer, we can cancel her appointment. Physical therapy has evaluated the patient and recommends home health PT and rolling walker. Care management also recommended HHRN. [facility]  The patient was seen and examined by Dr. NahsAcie Fredrickson feels that she is stable and ready for discharge today, 09/13/17.   Consultants: EP _____________  Discharge Vitals Blood pressure (!) 115/91, pulse 96, temperature 98.4 F (36.9 C), temperature source Oral, resp. rate (!) 33, height '5\' 7"'  (1.702 m), weight 197 lb 6.4 oz (89.5 kg), SpO2 100 %.  Filed Weights   09/11/17 0403 09/12/17 0330 09/13/17 0405  Weight: 203 lb 8 oz (92.3 kg) 201 lb 4.8 oz (91.3 kg) 197 lb 6.4 oz (89.5 kg)    Labs & Radiologic Studies    CBC No results for input(s): WBC, NEUTROABS, HGB, HCT, MCV, PLT in the last 72 hours. Basic Metabolic Panel Recent Labs    09/12/17 1035 09/13/17 0230  NA 143 141  K 3.7 4.1  CL 105 105  CO2 26 23  GLUCOSE 125* 114*  BUN 16 17  CREATININE 1.27* 1.30*  CALCIUM 9.6 9.4  _____________  Dg Chest Portable 1 View  Result Date: 09/05/2017 CLINICAL DATA:  Short of breath for 3 days EXAM: PORTABLE CHEST 1 VIEW COMPARISON:  08/04/2017 FINDINGS: Sternotomy wires overlie stable cardiac silhouette. Bibasilar atelectasis. Mild central venous congestion. Mild peripheral interstitial edema. No focal consolidation. No pneumothorax. IMPRESSION: 1.  Mild increase in interstitial edema pattern. 2. Mild bibasilar atelectasis. Electronically Signed   By: StewSuzy Bouchard.   On: 09/05/2017 09:09   Disposition   Pt is being discharged home today in good condition.  Follow-up Plans & Appointments    Follow-up Information    [facility] Follow up.   Specialties:  Nurse Practitioner, Radiology, Cardiology Why:  [facility] - 09/21/17 at 8am.Ce[place]7:45am to check in. KathCurt Bearsone of our nurse practitioners that you have met before. If you [facility] week, you should keep this appointment. Contact information: [address] [phone]          Discharge Instructions    (HEART FAILURE PATIENTS) Call MD:  Anytime you have any of the following symptoms: 1) 3 pound weight gain in 24 hours or 5 pounds  in 1 week 2) shortness of breath, with or without a dry hacking cough 3) swelling in the hands, feet or stomach 4) if you have to sleep on extra pillows at night in order to breathe.   Complete by:  As directed    AMB Referral to [place] [facility]   Complete by:  As directed    Please assign [employer] member to [place] transition of care and [facility] Pharmacist for medication adherence. Has had multiple hospitalizations. History of CAD, CABG, CHF, AFIB, HTN, cognitive impairment. Written consent obtained. Will contact PCP office to make aware [facility] will follow post discharge. Marthenia Rolling, [place], RN,BSN-[facility] GSUPJSR-[phone]   Reason for consult:  Please assign to [facility] Pharmacist   Diagnoses of:  Heart Failure   Expected date of contact:  1-3 days (reserved for hospital discharges)   Call MD for:  difficulty breathing, headache or visual disturbances   Complete by:  As directed    Call MD for:  severe uncontrolled pain   Complete by:  As directed    Diet - low sodium heart  healthy   Complete by:  As directed    Discharge instructions   Complete by:  As directed    If you notice any bleeding such as blood in stool, black tarry stools, blood in urine, nosebleeds or any other unusual bleeding, call your doctor immediately.  It is important that your take your medications as prescribed to keep you out of the hospital and at your home.  Would advise to generally stick to lower sodium diet (aiming for maximum 2,077m per day) and staying hydrated but not to excess >2L per day of total fluid intake.  We have set up for home health to come and visit you after leaving the hospital. Physical therapy, as well as a home health nurse will be available to help you.   We have set up a follow up appointment at our office if you choose to stay with [facility] for your cardiology follow up.   Increase activity slowly   Complete by:  As directed       Discharge Medications   Allergies as of 09/13/2017      Reactions   Bee Venom Anaphylaxis   Codeine Nausea And Vomiting   Shrimp [shellfish Allergy] Swelling   Tomato    Pt states it gives her gout      Medication List    STOP taking these medications   furosemide 40 MG tablet Commonly known as:  LASIX     TAKE these medications   acetaminophen 500 MG tablet Commonly known as:  TYLENOL Take 500 mg by mouth every 6 (six) hours as needed for moderate pain.   atorvastatin 10 MG tablet Commonly known as:  LIPITOR Take 10 mg by mouth daily.   carvedilol 25 MG tablet Commonly known as:  COREG Take 1 tablet (25 mg total) by mouth 2 (two) times daily with a meal.   diltiazem 360 MG 24 hr capsule Commonly known as:  CARDIZEM CD Take 1 capsule (360 mg total) by mouth daily. What changed:    medication strength  how much to take   febuxostat 40 MG tablet Commonly known as:  [name] Take 40 mg by mouth daily as needed.   fluconazole 100 MG tablet Commonly known as:  DIFLUCAN Take 100 mg by mouth daily.     fluticasone 50 MCG/ACT nasal spray Commonly known as:  FLONASE Place 1 spray into both nostrils daily. What changed:    when to take this  reasons to take this   hydrALAZINE 25 MG tablet Commonly known as:  APRESOLINE Take 1 tablet (25 mg total) by mouth every 8 (eight) hours.   ketoconazole 2 % cream Commonly known as:  NIZORAL Apply 1 application topically 2 (two) times daily.   magnesium oxide 400 (241.3 Mg) MG tablet Commonly known as:  MAG-OX Take 1 tablet (400 mg total) by mouth 3 (three) times daily. What changed:  when to take this   potassium chloride SA 20 MEQ tablet Commonly known as:  K-DUR,KLOR-CON Take 2 tablets (40 mEq total) by mouth 2 (two) times daily.   ranitidine 150 MG capsule Commonly known as:  ZANTAC Take 150 mg by mouth daily as needed for heartburn.   rivaroxaban 20 MG Tabs tablet Commonly known as:  XARELTO Take 1 tablet (20 mg total) by mouth daily with supper.   tiZANidine 2 MG tablet Commonly known as:  ZANAFLEX Take 2 mg by mouth daily as needed for muscle spasms.   torsemide 20 MG tablet Commonly known as:  DEMADEX Take 2 tablets (40 mg total) by mouth 2 (two) times daily.            Durable Medical Equipment  (From admission, onward)        Start     Ordered   09/13/17 1225  For home use only DME Walker rolling  Once    Question:  Patient needs a walker to treat with the following condition  Answer:  Gait instability   09/13/17 1225      Outstanding Labs/Studies    Duration of Discharge Encounter   Greater than 30 minutes including physician time.  SignedKathyrn Drown NP 09/13/2017, 1:14 PM  Attending Note:   The patient was seen and examined.  Agree with assessment and plan as noted above.  Changes made to the above note as needed.  Patient seen and independently examined with Kathyrn Drown, NP.   We discussed all aspects of the encounter. I agree with the assessment and plan as stated above.  1.  Atrial  fib:   HR is better.   She has had issues with compliance in the past Continue meds    I have spent a total of 40 minutes with patient reviewing hospital  notes , telemetry, EKGs, labs and examining patient as well as establishing an assessment and plan that was discussed with the patient. > 50% of time was spent in direct patient care.    Thayer Headings, Brooke Bonito., MD, [facility] 09/13/2017, 6:42 PM [address]  [place] Pager [phone]

## 2017-09-13 NOTE — Progress Notes (Signed)
SATURATION QUALIFICATIONS: (This note is used to comply with regulatory documentation for home oxygen)  Patient Saturations on Room Air at Rest = 94%  Patient Saturations on Room Air while Ambulating = 77%  Patient Saturations on 2 Liters of oxygen while Ambulating = 93%  Please briefly explain why patient needs home oxygen:desaturation with activity  Governor Rooks, PTA pager [phone]

## 2017-09-13 NOTE — Progress Notes (Signed)
Physical Therapy Treatment Patient Details Name: [name] [name] MRN: [id number] DOB: 12-10-1945 Today's Date: 09/13/2017    History of Present Illness Pt adm with afib with rvr and acute heart failure. Pt also developed painful rt foot. PMH - CAD, s/p CABG, parox afib, HTN, ICM, gout    PT Comments    Pt performed additional trial to ambulate on RA for home O2 qualifications.  Pt did desaturate and required 2L [place].  RN and CM informed and RN to address needs with patient's physician.    Follow Up Recommendations  Home health PT     Equipment Recommendations  (rollator (4 wheeled RW with seat))    Recommendations for Other Services       Precautions / Restrictions Precautions Precautions: Fall Restrictions Weight Bearing Restrictions: No    Mobility  Bed Mobility               General bed mobility comments: Pt in recliner on arrival with telesitter present  Transfers Overall transfer level: Needs assistance Equipment used: Rolling walker (2 wheeled) Transfers: Sit to/from Stand Sit to Stand: Supervision;Min guard Stand pivot transfers: Supervision       General transfer comment: cued for safety with telemetry lines and O2 and hand placement.  Pt required repeated trials for improved eccentric loading when returning to the chair.    Ambulation/Gait Ambulation/Gait assistance: Supervision Ambulation Distance (Feet): 140 Feet Assistive device: Rolling walker (2 wheeled) Gait Pattern/deviations: Step-through pattern;Decreased stride length;Wide base of support Gait velocity: decr Gait velocity interpretation: Below normal speed for age/gender General Gait Details: Pt ambulated on RA and desaturated on to the lowest of 77%, returned patient to room and placed 2L and O2 resolved to 93%.     Stairs            Wheelchair Mobility    Modified Rankin (Stroke Patients Only)       Balance Overall balance assessment: Needs assistance   Sitting  balance-Leahy Scale: Good     Standing balance support: Bilateral upper extremity supported;During functional activity Standing balance-Leahy Scale: Fair                              Cognition Arousal/Alertness: Awake/alert Behavior During Therapy: Impulsive Overall Cognitive Status: Impaired/Different from baseline Area of Impairment: Following commands;Safety/judgement;Awareness;Problem solving                   Current Attention Level: Sustained Memory: Decreased short-term memory Following Commands: Follows one step commands inconsistently;Follows one step commands with increased time Safety/Judgement: Decreased awareness of safety;Decreased awareness of deficits Awareness: Intellectual Problem Solving: Requires verbal cues;Requires tactile cues General Comments: Pt reporting she [place] therapist [place] despite meeting for the first time.        Exercises      General Comments        Pertinent Vitals/Pain Pain Assessment: No/denies pain    Home Living                      Prior Function            PT Goals (current goals can now be found in the care plan section) Acute Rehab PT Goals Patient Stated Goal: go home Potential to Achieve Goals: Good Progress towards PT goals: Progressing toward goals    Frequency    Min 3X/week      PT Plan Current plan remains appropriate  Co-evaluation              AM-PAC PT "6 Clicks" Daily Activity  Outcome Measure  Difficulty turning over in bed (including adjusting bedclothes, sheets and blankets)?: None Difficulty moving from lying on back to sitting on the side of the bed? : None Difficulty sitting down on and standing up from a chair with arms (e.g., wheelchair, bedside commode, etc,.)?: Unable Help needed moving to and from a bed to chair (including a wheelchair)?: A Little Help needed walking in hospital room?: A Little Help needed climbing 3-5 steps with a railing? :  A Little 6 Click Score: 18    End of Session Equipment Utilized During Treatment: Gait belt;Oxygen Activity Tolerance: Patient tolerated treatment well;Treatment limited secondary to medical complications (Comment) Patient left: in chair;with call bell/phone within reach;with nursing/sitter in room;with family/visitor present Nurse Communication: Mobility status PT Visit Diagnosis: Unsteadiness on feet (R26.81);Muscle weakness (generalized) (M62.81);Difficulty in walking, not elsewhere classified (R26.2)     Time: 1436-1450 PT Time Calculation (min) (ACUTE ONLY): 14 min  Charges:  $Gait Training: 8-22 mins $Therapeutic Activity: 8-22 mins                    G Codes:       Governor Rooks, PTA pager [phone]    Cristela Blue 09/13/2017, 2:57 PM

## 2017-09-13 NOTE — Progress Notes (Signed)
Governor Rooks, PT worked with patient ambulating in the hall at first with 2L O2 and then on room air.  Patient's oxygen saturation dropped during ambulation with oxygen saturations at 86 % while on 2 L oxygen and 77-79% without oxygen (room air).  Paged Kathyrn Drown at 1500 and then again at 1530 for further directions about home oxygen needs.

## 2017-09-13 NOTE — Progress Notes (Signed)
Notified by provider, Kathyrn Drown, NP, that patient will remain on unit and not be discharged today.  Provider requested that patient is ambulated with oxygen with oxygen saturations monitored to assess the need for home oxygen.

## 2017-09-13 NOTE — Progress Notes (Deleted)
Governor Rooks, PT worked with patient ambulating in the hall at first with 2L O2 and then on room air.  Patient's oxygen saturation dropped during ambulation with oxygen saturations at 86 % while on 2 L oxygen and 77-79% without oxygen (room air).  Paged Kathyrn Drown at 1500 and then again at 1530 for further directions about home oxygen needs.

## 2017-09-13 NOTE — Progress Notes (Signed)
Video monitored notified me of STAT alarm for pt attempting to get up by herself to use BSC.  Checked on pt and staff, Megan NT was in the room.  Reminded patient to notify staff for assistance and ensured pt safety.

## 2017-09-13 NOTE — Progress Notes (Signed)
Progress Note  Patient Name: [name] [name] Date of Encounter: 09/13/2017  Primary Cardiologist: [name], MD   Subjective   72 year old female with a history of coronary artery disease, status post coronary artery bypass grafting in 2016, ischemic cardia myopathy, atrial fibrillation, hypertension who was seen in consultation on January 24 for atrial fibrillation with a rapid ventricular response.  HR is still 105 ( not 51 as reported )  She is ready to go home   Inpatient Medications    Scheduled Meds: . atorvastatin  10 mg Oral q1800  . carvedilol  25 mg Oral BID WC  . diltiazem  360 mg Oral Daily  . famotidine  20 mg Oral Daily  . febuxostat  40 mg Oral Daily  . hydrALAZINE  25 mg Oral Q8H  . magnesium oxide  400 mg Oral TID  . potassium chloride SA  40 mEq Oral BID  . rivaroxaban  20 mg Oral Q supper  . sodium chloride flush  3 mL Intravenous Q12H  . torsemide  40 mg Oral BID   Continuous Infusions: . sodium chloride     PRN Meds: sodium chloride, acetaminophen, alum & mag hydroxide-simeth, haloperidol lactate, ipratropium-albuterol, sodium chloride flush   Vital Signs    Vitals:   09/12/17 2337 09/13/17 0329 09/13/17 0405 09/13/17 0845  BP: (!) 112/93 105/82  123/80  Pulse:  92  (!) 51  Resp: (!) 21   19  Temp:  (!) 97.5 F (36.4 C)  98.6 F (37 C)  TempSrc:  Oral  Oral  SpO2:  96%  97%  Weight:   197 lb 6.4 oz (89.5 kg)   Height:        Intake/Output Summary (Last 24 hours) at 09/13/2017 1008 Last data filed at 09/13/2017 0202 Gross per 24 hour  Intake 240 ml  Output 1550 ml  Net -1310 ml   Filed Weights   09/11/17 0403 09/12/17 0330 09/13/17 0405  Weight: 203 lb 8 oz (92.3 kg) 201 lb 4.8 oz (91.3 kg) 197 lb 6.4 oz (89.5 kg)    Telemetry    AF with rapid vent response - Personally Reviewed  ECG     AF with rapid vent response  - Personally Reviewed  Physical Exam   GEN: No acute distress.   Neck: No JVD Cardiac: ijrreg.    Respiratory: Clear to auscultation bilaterally. GI: Soft, nontender, non-distended  MS: No edema; No deformity. Neuro:  Nonfocal  Psych: Normal affect   Labs    Chemistry Recent Labs  Lab 09/10/17 0334 09/12/17 1035 09/13/17 0230  NA 141 143 141  K 4.5 3.7 4.1  CL 108 105 105  CO2 22 26 23   GLUCOSE 121* 125* 114*  BUN 17 16 17   CREATININE 1.21* 1.27* 1.30*  CALCIUM 8.9 9.6 9.4  GFRNONAA 44* 41* 40*  GFRAA 51* 48* 46*  ANIONGAP 11 12 13      HematologyNo results for input(s): WBC, RBC, HGB, HCT, MCV, MCH, MCHC, RDW, PLT in the last 168 hours.  Cardiac EnzymesNo results for input(s): TROPONINI in the last 168 hours. No results for input(s): TROPIPOC in the last 168 hours.   BNPNo results for input(s): BNP, PROBNP in the last 168 hours.   DDimer No results for input(s): DDIMER in the last 168 hours.   Radiology    No results found.  Cardiac Studies     Patient Profile     72 y.o. female   [place]  1. Atrial fib :   HR is slighltty better. Still somewhat tachycardic Now she says that she wants to be followed in [facility] ( Im not sure that this is really what she wants to do )  Will schedule her an appt with [place] in several weeks .   If she gets established in [place], we can cancel our appt.  Follow up with Dr. Debara Pickett      For questions or updates, please contact [facility] Please consult [email or web address] for contact info under Cardiology/STEMI.      Signed, Mertie Moores, MD  09/13/2017, 10:08 AM

## 2017-09-14 ENCOUNTER — Inpatient Hospital Stay (HOSPITAL_COMMUNITY): Payer: PPO

## 2017-09-14 DIAGNOSIS — D696 Thrombocytopenia, unspecified: Secondary | ICD-10-CM

## 2017-09-14 LAB — CBC
HCT: 47.7 % — ABNORMAL HIGH (ref 36.0–46.0)
Hemoglobin: 15.4 g/dL — ABNORMAL HIGH (ref 12.0–15.0)
MCH: 27.9 pg (ref 26.0–34.0)
MCHC: 32.3 g/dL (ref 30.0–36.0)
MCV: 86.6 fL (ref 78.0–100.0)
Platelets: 116 10*3/uL — ABNORMAL LOW (ref 150–400)
RBC: 5.51 MIL/uL — ABNORMAL HIGH (ref 3.87–5.11)
RDW: 16.4 % — ABNORMAL HIGH (ref 11.5–15.5)
WBC: 5.2 10*3/uL (ref 4.0–10.5)

## 2017-09-14 LAB — PROCALCITONIN: Procalcitonin: 0.19 ng/mL

## 2017-09-14 LAB — BASIC METABOLIC PANEL
Anion gap: 13 (ref 5–15)
BUN: 18 mg/dL (ref 6–20)
CO2: 24 mmol/L (ref 22–32)
Calcium: 9.4 mg/dL (ref 8.9–10.3)
Chloride: 104 mmol/L (ref 101–111)
Creatinine, Ser: 1.44 mg/dL — ABNORMAL HIGH (ref 0.44–1.00)
GFR calc Af Amer: 41 mL/min — ABNORMAL LOW (ref >60)
GFR calc non Af Amer: 35 mL/min — ABNORMAL LOW (ref >60)
Glucose, Bld: 120 mg/dL — ABNORMAL HIGH (ref 65–99)
Potassium: 4.1 mmol/L (ref 3.5–5.1)
Sodium: 141 mmol/L (ref 135–145)

## 2017-09-14 LAB — LACTIC ACID, PLASMA: Lactic Acid, Venous: 1.1 mmol/L (ref 0.5–1.9)

## 2017-09-14 LAB — URINALYSIS, ROUTINE W REFLEX MICROSCOPIC
Bilirubin Urine: NEGATIVE
Glucose, UA: NEGATIVE mg/dL
Hgb urine dipstick: NEGATIVE
Ketones, ur: NEGATIVE mg/dL
Leukocytes, UA: NEGATIVE
Nitrite: NEGATIVE
Protein, ur: NEGATIVE mg/dL
Specific Gravity, Urine: 1.01 (ref 1.005–1.030)
pH: 6 (ref 5.0–8.0)

## 2017-09-14 MED ORDER — DILTIAZEM HCL-DEXTROSE 100-5 MG/100ML-% IV SOLN (PREMIX)
5.0000 mg/h | INTRAVENOUS | Status: DC
  Administered 2017-09-14: 5 mg/h via INTRAVENOUS
  Administered 2017-09-15: 7.5 mg/h via INTRAVENOUS
  Filled 2017-09-14 (×2): qty 100

## 2017-09-14 MED ORDER — VANCOMYCIN HCL 10 G IV SOLR
1500.0000 mg | Freq: Once | INTRAVENOUS | Status: AC
  Administered 2017-09-14: 1500 mg via INTRAVENOUS
  Filled 2017-09-14: qty 1500

## 2017-09-14 MED ORDER — PIPERACILLIN-TAZOBACTAM 3.375 G IVPB 30 MIN
3.3750 g | Freq: Once | INTRAVENOUS | Status: AC
  Administered 2017-09-14: 3.375 g via INTRAVENOUS
  Filled 2017-09-14: qty 50

## 2017-09-14 MED ORDER — ACETAMINOPHEN 500 MG PO TABS
1000.0000 mg | ORAL_TABLET | Freq: Four times a day (QID) | ORAL | Status: DC | PRN
  Administered 2017-09-14 – 2017-09-23 (×6): 1000 mg via ORAL
  Filled 2017-09-14 (×6): qty 2

## 2017-09-14 MED ORDER — SODIUM CHLORIDE 0.9 % IV BOLUS (SEPSIS): 250.0000 mL | Freq: Once | INTRAVENOUS | Status: DC

## 2017-09-14 MED ORDER — PIPERACILLIN-TAZOBACTAM 3.375 G IVPB
3.3750 g | Freq: Three times a day (TID) | INTRAVENOUS | Status: DC
  Administered 2017-09-14 – 2017-09-17 (×8): 3.375 g via INTRAVENOUS
  Filled 2017-09-14 (×9): qty 50

## 2017-09-14 MED ORDER — VANCOMYCIN HCL IN DEXTROSE 1-5 GM/200ML-% IV SOLN
1000.0000 mg | Freq: Once | INTRAVENOUS | Status: DC
  Filled 2017-09-14: qty 200

## 2017-09-14 MED ORDER — VANCOMYCIN HCL 10 G IV SOLR
1250.0000 mg | INTRAVENOUS | Status: DC
  Filled 2017-09-14: qty 1250

## 2017-09-14 MED ORDER — DILTIAZEM LOAD VIA INFUSION
15.0000 mg | Freq: Once | INTRAVENOUS | Status: AC
  Administered 2017-09-14: 15 mg via INTRAVENOUS
  Filled 2017-09-14: qty 15

## 2017-09-14 MED ORDER — DILTIAZEM LOAD VIA INFUSION: 15.0000 mg | Freq: Once | INTRAVENOUS | Status: DC

## 2017-09-14 MED ORDER — DILTIAZEM HCL-DEXTROSE 100-5 MG/100ML-% IV SOLN (PREMIX): 5.0000 mg/h | INTRAVENOUS | Status: DC

## 2017-09-14 NOTE — Progress Notes (Signed)
Contacted PA Meng and told him that patient heart rate 88 and B/P 73/56 and cardizem drip had been turned back to 5mg /hr. PA Eulas Post told me to begin NS 250 ml at 117ml/hr and to retake B/P in 30 minutes and call him. Turned off cardizem and will restart when directed by PA.

## 2017-09-14 NOTE — Progress Notes (Signed)
PT Cancellation Note  Patient Details Name: [name] [name] MRN: [id number] DOB: November 03, 1945   Cancelled Treatment:    Reason Eval/Treat Not Completed: Medical issues which prohibited therapy. Rn advised postponing treatment will tomorrow. Pt resting after exhibiting fever and paranoia. Currently placed on cardizem drip. Will check back tomorrow.  Benjiman Core, PTA Pager [phone] Acute Rehab  Allena Katz 09/14/2017, 3:23 PM

## 2017-09-14 NOTE — Consult Note (Signed)
Medical Consultation   [name] [name]  GNF:[id number]  DOB: 1946-07-07  DOA: 09/05/2017  PCP: [name] Low, MD   Outpatient Specialists: [name] Latus MD  Electrophysiology   [place]   Requesting physician: [name] Copa PA-C  Mertie Moores MD  Reason for consultation: Fever, Confusion and Paranoia    History of Present Illness: [name] [name] is an 72 y.o. female is a 72yo female admitted to cardiology on 1/22 due to atrial fibrillation with RVR. Pt treated with Cardizem and has had rate control.  Pt was decided to not be a candidate for ablation.  Pt was scheduled to be discharged yesterday with home health and follow up in A fib clinic.  Pt was noted to have develop a fever of 101.4 last pm. Pt increased to 103 this am.  Rn's have reported pt has had a cough.  Pt has also developed confusion and paranoia.  Pt has reported concerns that some one is trying to poison her.       Review of Systems:  Review of Systems  Constitutional: Positive for chills and fever.  Respiratory: Positive for cough.   Psychiatric/Behavioral: Positive for hallucinations. The patient is nervous/anxious.    As per HPI otherwise 10 point review of systems negative.    Past Medical History: Past Medical History:  Diagnosis Date  . Acute respiratory failure ([place]) 08/2017  . Arthritis   . Asthma   . CAD (coronary artery disease)    a. s/p CABG 2016.  . Cardiomyopathy, ischemic    a. EF previously [name], improved to LVEF 50-55% as of May 2018  . Chronic combined systolic and diastolic heart failure ([place])   . CKD (chronic kidney disease), stage III ([place])   . Gout   . Hypertension   . Hypokalemia   . LGI bleed 08/06/2017   a. felt to be hemorrhoidal during that admission (no drop in Hgb).  . Persistent atrial fibrillation (HCC)    a. h/o difficult to control rates (complicated by noncompliance), not felt to be a candidate for ablation or antiarrhythmic due to noncompliance.    . Personal history of noncompliance with medical treatment, presenting hazards to health   . S/P CABG x 2 with clipping of LA appendage 11/14/2014   LIMA to LAD, SVG to OM, EVH via right thigh    Past Surgical History: Past Surgical History:  Procedure Laterality Date  . CARDIOVERSION N/A 11/18/2014   Procedure: CARDIOVERSION;  Surgeon: [name], MD;  Location: [facility];  Service: Cardiovascular;  Laterality: N/A;  . CLIPPING OF ATRIAL APPENDAGE N/A 11/14/2014   Procedure: CLIPPING OF ATRIAL APPENDAGE;  Surgeon: [name], MD;  Location: [facility];  Service: Open Heart Surgery;  Laterality: N/A;  . CORONARY ARTERY BYPASS GRAFT N/A 11/14/2014   Procedure: CORONARY ARTERY BYPASS GRAFTING (CABG)TIMES 2 USING LEFT INTERNAL MAMMARY ARTERY AND RIGHT SAPHENOUS VEIN HARVESTED ENDOSCOPICALLY;  Surgeon: [name], MD;  Location: [facility];  Service: Open Heart Surgery;  Laterality: N/A;  . LEFT HEART CATHETERIZATION WITH CORONARY ANGIOGRAM N/A 11/04/2014   Procedure: LEFT HEART CATHETERIZATION WITH CORONARY ANGIOGRAM;  Surgeon: Troy Sine, MD;  Location: [facility] CATH LAB;  Service: Cardiovascular;  Laterality: N/A;  . TEE WITHOUT CARDIOVERSION N/A 11/14/2014   Procedure: TRANSESOPHAGEAL ECHOCARDIOGRAM (TEE);  Surgeon: [name], MD;  Location: [facility];  Service: Open Heart Surgery;  Laterality: N/A;  . TEMPORARY PACEMAKER  INSERTION  11/04/2014   Procedure: TEMPORARY PACEMAKER INSERTION;  Surgeon: Troy Sine, MD;  Location: [facility] CATH LAB;  Service: Cardiovascular;;     Allergies:   Allergies  Allergen Reactions  . Bee Venom Anaphylaxis  . Codeine Nausea And Vomiting  . Shrimp [Shellfish Allergy] Swelling  . Tomato     Pt states it gives her gout     Social History:  reports that she has been smoking cigarettes.  She has a 5.60 pack-year smoking history. she has never used smokeless tobacco. She reports that she does not drink alcohol or use drugs.   Family History: Family History   Problem Relation Age of Onset  . Cancer Mother   . Heart disease Father       Physical Exam: Vitals:   09/13/17 2317 09/13/17 2356 09/14/17 0327 09/14/17 0759  BP:  122/84  (!) 133/98  Pulse:    (!) 127  Resp:    (!) 22  Temp: 98.8 F (37.1 C) 99.4 F (37.4 C) 98.9 F (37.2 C) (!) 103 F (39.4 C)  TempSrc:  Oral Oral Oral  SpO2:    95%  Weight:   90.2 kg (198 lb 13.7 oz)   Height:        Constitutional: Alert and awake, oriented x2, not in any acute distress. Eyes: PERLA, EOMI, irises appear normal, anicteric sclera,  ENMT: external ears and nose appear normal, normal hearing, Lips appear normal, oropharynx mucosa, tongue, posterior pharynx appear normal  Neck: neck appears normal, no masses, normal ROM, no thyromegaly, no JVD  CVS: tachycardia  Respiratory:  clear to auscultation frequent coughing bilaterally, no wheezing, rales or rhonchi. Respiratory effort normal. No accessory muscle use.  Abdomen: soft nontender, nondistended, normal bowel sounds, no hepatosplenomegaly, no hernias  Musculoskeletal: : no cyanosis, clubbing or edema noted bilaterally Neuro: Cranial nerves II-XII intact, strength, sensation, reflexes Psych: confused,  Skin: no rashes or lesions or ulcers, no induration or nodules    Data reviewed:  I have personally reviewed following labs and imaging studies Labs:  CBC: Recent Labs  Lab 09/14/17 0202  WBC 5.2  HGB 15.4*  HCT 47.7*  MCV 86.6  PLT 116*    Basic Metabolic Panel: Recent Labs  Lab 09/09/17 0344 09/10/17 0334 09/12/17 1035 09/13/17 0230 09/14/17 0202  NA 141 141 143 141 141  K 3.8 4.5 3.7 4.1 4.1  CL 107 108 105 105 104  CO2 22 22 26 23 24   GLUCOSE 190* 121* 125* 114* 120*  BUN 16 17 16 17 18   CREATININE 1.25* 1.21* 1.27* 1.30* 1.44*  CALCIUM 8.9 8.9 9.6 9.4 9.4   GFR Estimated Creatinine Clearance: 40.7 mL/min (A) (by C-G formula based on SCr of 1.44 mg/dL (H)). Liver Function Tests: No results for input(s): AST,  ALT, ALKPHOS, BILITOT, PROT, ALBUMIN in the last 168 hours. No results for input(s): LIPASE, AMYLASE in the last 168 hours. No results for input(s): AMMONIA in the last 168 hours. Coagulation profile No results for input(s): INR, PROTIME in the last 168 hours.  Cardiac Enzymes: No results for input(s): CKTOTAL, CKMB, CKMBINDEX, TROPONINI in the last 168 hours. BNP: Invalid input(s): POCBNP CBG: No results for input(s): GLUCAP in the last 168 hours. D-Dimer No results for input(s): DDIMER in the last 72 hours. Hgb A1c No results for input(s): HGBA1C in the last 72 hours. Lipid Profile No results for input(s): CHOL, HDL, LDLCALC, TRIG, CHOLHDL, LDLDIRECT in the last 72 hours. Thyroid function studies No results  for input(s): TSH, T4TOTAL, T3FREE, THYROIDAB in the last 72 hours.  Invalid input(s): FREET3 Anemia work up No results for input(s): VITAMINB12, FOLATE, FERRITIN, TIBC, IRON, RETICCTPCT in the last 72 hours. Urinalysis    Component Value Date/Time   COLORURINE YELLOW 09/14/2017 0336   APPEARANCEUR CLEAR 09/14/2017 0336   LABSPEC 1.010 09/14/2017 0336   PHURINE 6.0 09/14/2017 0336   GLUCOSEU NEGATIVE 09/14/2017 0336   HGBUR NEGATIVE 09/14/2017 0336   BILIRUBINUR NEGATIVE 09/14/2017 0336   BILIRUBINUR ng 12/25/2013 1342   KETONESUR NEGATIVE 09/14/2017 0336   PROTEINUR NEGATIVE 09/14/2017 0336   UROBILINOGEN 0.2 04/11/2017 1402   NITRITE NEGATIVE 09/14/2017 0336   LEUKOCYTESUR NEGATIVE 09/14/2017 0336     Microbiology Recent Results (from the past 240 hour(s))  MRSA PCR Screening     Status: None   Collection Time: 09/05/17  7:18 PM  Result Value Ref Range Status   MRSA by PCR NEGATIVE NEGATIVE Final    Comment:        The GeneXpert MRSA Assay (FDA approved for NASAL specimens only), is one component of a comprehensive MRSA colonization surveillance program. It is not intended to diagnose MRSA infection nor to guide or monitor treatment for MRSA  infections.        Inpatient Medications:   Scheduled Meds: . atorvastatin  10 mg Oral q1800  . carvedilol  25 mg Oral BID WC  . diltiazem  15 mg Intravenous Once  . famotidine  20 mg Oral Daily  . febuxostat  40 mg Oral Daily  . magnesium oxide  400 mg Oral TID  . potassium chloride SA  40 mEq Oral BID  . rivaroxaban  20 mg Oral Q supper  . sodium chloride flush  3 mL Intravenous Q12H  . torsemide  40 mg Oral BID   Continuous Infusions: . sodium chloride    . diltiazem (CARDIZEM) infusion       Radiological Exams on Admission: No results found.  Impression/Recommendations Principal Problem:   Atrial fibrillation with RVR (HCC) Active Problems:   Hypokalemia   Essential hypertension   S/P CABG x 2   CKD (chronic kidney disease), stage III (HCC)   Cardiomyopathy, ischemic   Acute on chronic diastolic heart failure (HCC)   Medication noncompliance due to cognitive impairment   Acute drug-induced gout of right foot   Thrombocytopenia (HCC)  1 Sepsis Fever, tachycardia and tachypnea  -While awaiting blood cultures, this appears to be a preseptic condition. -Sepsis protocol initiated; hold IV fluids as pt is not hypotensive and while lactate is pending  -Blood and urine cultures pending -Continue stepdown  status with telemetry and continue to monitor - broaden spectrum for uncertain diagnosis specifically Vancomycin and Zosyn   Influenza panel pending. Pt reports having an influenza test but has confusion about when.   2) Confusion/Paronoia  Speaking with nursing staff reveals pt has had some confusion this visit.  Pt has had some confusion on previous visits per nursing staff.  Confusion seems to have been exacerbated since fever.  Pt to receive tylenol for fever. Pt seems to have  dementia at baseline.  We will continue to monitor to see if pt improves to her baseline with  treatment.   3)Atrial Fibrillation Cardiology will continue to manage  Thank you for this  consultation.  Our [facility] hospitalist team will follow the patient with you.   Time Spent:   Alyse [name] M.D. [facility] 09/14/2017, 8:50 AM

## 2017-09-14 NOTE — Progress Notes (Addendum)
Progress Note  Patient Name: [name] [name] Date of Encounter: 09/14/2017  Primary Cardiologist: Dr. Debara Pickett  Subjective   This is my first time meeting her but she exhibits paranoia this morning - states there is someone trying to harm her and we need to figure out who. She says someone with the lab has been putting something into her blood, and whenever she wakes up, there are lots of people standing around her. Temp of 101.4 overnight, then 103 this AM. Denies any CP, SOB. Does have a dry cough but unable to tell me how long this has been going on for.   Inpatient Medications    Scheduled Meds: . atorvastatin  10 mg Oral q1800  . carvedilol  25 mg Oral BID WC  . diltiazem  360 mg Oral Daily  . famotidine  20 mg Oral Daily  . febuxostat  40 mg Oral Daily  . hydrALAZINE  25 mg Oral Q8H  . magnesium oxide  400 mg Oral TID  . potassium chloride SA  40 mEq Oral BID  . rivaroxaban  20 mg Oral Q supper  . sodium chloride flush  3 mL Intravenous Q12H  . torsemide  40 mg Oral BID   Continuous Infusions: . sodium chloride     PRN Meds: sodium chloride, acetaminophen, alum & mag hydroxide-simeth, haloperidol lactate, ipratropium-albuterol, sodium chloride flush   Vital Signs    Vitals:   09/13/17 2006 09/13/17 2317 09/13/17 2356 09/14/17 0327  BP: 113/79  122/84   Pulse:      Resp: (!) 26     Temp: (!) 101.4 F (38.6 C) 98.8 F (37.1 C) 99.4 F (37.4 C) 98.9 F (37.2 C)  TempSrc: Oral  Oral Oral  SpO2:      Weight:    198 lb 13.7 oz (90.2 kg)  Height:        Intake/Output Summary (Last 24 hours) at 09/14/2017 0658 Last data filed at 09/14/2017 0650 Gross per 24 hour  Intake 720 ml  Output 400 ml  Net 320 ml   Filed Weights   09/12/17 0330 09/13/17 0405 09/14/17 0327  Weight: 201 lb 4.8 oz (91.3 kg) 197 lb 6.4 oz (89.5 kg) 198 lb 13.7 oz (90.2 kg)    Telemetry    Atrial fib RVR 120s-140s - Personally Reviewed  Physical Exam   GEN: No acute distress.  HEENT:  Normocephalic, atraumatic, sclera non-icteric. Neck: No JVD or bruits. Cardiac: Irregular, tachycardic, no murmurs, rubs, or gallops.  Radials/DP/PT 1+ and equal bilaterally.  Respiratory: Diffusely diminished bilaterally. No wheezes, rales, rhonchi. Breathing is unlabored. GI: Soft, nontender, non-distended, BS +x 4. MS: no deformity. Extremities: No clubbing or cyanosis. No edema. Distal pedal pulses are 2+ and equal bilaterally. Neuro:  A+O to self, [facility], [facility], self. Thinks its 2020, knows its January. Follows commands. Psych:  Responds to questions appropriately with a paranoid affect.  Labs    Chemistry Recent Labs  Lab 09/12/17 1035 09/13/17 0230 09/14/17 0202  NA 143 141 141  K 3.7 4.1 4.1  CL 105 105 104  CO2 26 23 24   GLUCOSE 125* 114* 120*  BUN 16 17 18   CREATININE 1.27* 1.30* 1.44*  CALCIUM 9.6 9.4 9.4  GFRNONAA 41* 40* 35*  GFRAA 48* 46* 41*  ANIONGAP 12 13 13      Hematology Recent Labs  Lab 09/14/17 0202  WBC 5.2  RBC 5.51*  HGB 15.4*  HCT 47.7*  MCV 86.6  MCH 27.9  MCHC 32.3  RDW 16.4*  PLT 116*    Cardiac EnzymesNo results for input(s): TROPONINI in the last 168 hours. No results for input(s): TROPIPOC in the last 168 hours.   BNPNo results for input(s): BNP, PROBNP in the last 168 hours.   DDimer No results for input(s): DDIMER in the last 168 hours.   Radiology    No results found.  Cardiac Studies   Limited echo 12/2016 Study Conclusions - Left ventricle: Posterior lateral hypokinesis. The cavity size   was normal. Systolic function was normal. The estimated ejection   fraction was in the range of 50% to 55%. Left ventricular   diastolic function parameters were normal. - Aortic valve: There was mild regurgitation. - Mitral valve: There was mild regurgitation. - Left atrium: The atrium was moderately dilated. - Atrial septum: No defect or patent foramen ovale was identified.  Patient Profile     72 y.o. female  with CAD s/p CABG in 2016, ischemic cardiomyopathy with improvement in EF to normal s/p CABG, persistent atrial fibrillation w/CHA2DS2 VASc score of 5 on Xarelto, s/p LAA clipping, HTN, CKD III and questionable medication compliance who presented to the ED on 09/05/17 with complaints of dyspneaand palpitations, found to be in atrial fibrillation w/ RVR and acute on chronic diastolic CHF.   Assessment & Plan    1. Hypoxia with ambulation (not previously on home O2), also now with fever and paranoia - pt was due to be discharged yesterday but DC held because patient was noted to desat to 77% with ambulation. She did not come in with any O2 requirements. TMax 103 overnight. CXR pending. UA unremarkable and no leukocytosis. Blood cx pending (ordered overnight). Will check respiratory panel and place on droplet precautions. Neuro exam is otherwise nonfocal. Will change tylenol rx to include PRN fever. Spoke with IM who will consult on the patient.  2. Persistent atrial fib with RVR - despite high doses of carvedilol and diltiazem, she remains tachycardic this AM, up from the last 2 days. I suspect her RVR is driven by acute illness. TSH wnl this admission. Per d/w Dr. Acie Fredrickson, continue carvedilol, hold oral diltiazem and start IV diltiazem (15mg  bolus and then drip at 5-20mg /hr). Will tentatively hold hydralazine to allow [place] to focus on rate control just in case she begins to get hypotensive with her acute illness and tachycardia. Note that discharge med rec was already completed yesterday in anticipation of pt discharge so any future changes will need to be minded when finalizing dc.  3. Acute on chronic diastolic CHF - weight significantly improved from admission, up 1lb from yesterday but relatively stable overall. Cr rising this AM but recent baseline appears 1.2-1.5. Will review with Dr. Acie Fredrickson who likely has a better sense for her volume as he has followed her daily.  4. CKD III - creatinine up slightly  this AM; see above. Recent baseline appears 1.2-1.5. BUN wnl.  5. Minimal troponin elevation with history of CAD s/p CABG - not felt related to ACS this admission.  6. Thrombocytopenia - appears chronic, follow.  For questions or updates, please contact [place] Please consult [email or web address] for contact info under Cardiology/STEMI.  Signed, Charlie Pitter, PA-C 09/14/2017, 6:58 AM     Attending Note:   The patient was seen and examined.  Agree with assessment and plan as noted above.  Changes made to the above note as needed.  Patient seen and independently examined with Melina Copa, PA .   We  discussed all aspects of the encounter. I agree with the assessment and plan as stated above.  1.  Atrial fibrillation with a rapid ventricular response.  Patient continues to have atrial fibrillation with rapid ventricular response.  Her heart rate is faster today.  This is because of her temperature of 103 F. For the next day or so we will treat her with IV Cardizem instead of p.o. Cardizem. Continue carvedilol 25 mg twice a day.  2.  Upper respiratory tract infection.  Overnight she developed hypoxemia and a fever of 103. I suspect that she might have influenza.  We have consulted the internal medicine consultation team to help [place] with this  upper respiratory tract infection.    I have spent a total of 40 minutes with patient reviewing hospital  notes , telemetry, EKGs, labs and examining patient as well as establishing an assessment and plan that was discussed with the patient. > 50% of time was spent in direct patient care.    Thayer Headings, Brooke Bonito., MD, [facility] 09/14/2017, 1:52 PM [address],  [place] Pager [phone]

## 2017-09-14 NOTE — Progress Notes (Signed)
Pharmacy Antibiotic Note  [name] [name] is a 72 y.o. female admitted on 09/05/2017 with afib with rvr.    Discharge was cancelled, patient exhibiting paranoia this morning fever high fever overnight. With concern for sepsis pharmacy has been consulted for vancomycin and zosyn dosing.  Febrile to 103 overnight, wbc normal at 5, scr trending up to 1.44 this morning.    Plan: Vancomycin 1250 IV every 24 hours.  Goal trough 15-20 mcg/mL. Zosyn 3.375g IV q8h (4 hour infusion).  Height: 5\' 7"  (170.2 cm) Weight: 198 lb 13.7 oz (90.2 kg) IBW/kg (Calculated) : 61.6  Temp (24hrs), Avg:99.8 F (37.7 C), Min:98.4 F (36.9 C), Max:103 F (39.4 C)  Recent Labs  Lab 09/09/17 0344 09/10/17 0334 09/12/17 1035 09/13/17 0230 09/14/17 0202  WBC  --   --   --   --  5.2  CREATININE 1.25* 1.21* 1.27* 1.30* 1.44*    Estimated Creatinine Clearance: 40.7 mL/min (A) (by C-G formula based on SCr of 1.44 mg/dL (H)).    Allergies  Allergen Reactions  . Bee Venom Anaphylaxis  . Codeine Nausea And Vomiting  . Shrimp [Shellfish Allergy] Swelling  . Tomato     Pt states it gives her gout    Thank you for allowing pharmacy to be a part of this patient's care.  Erin Hearing PharmD., BCPS Clinical Pharmacist 09/14/2017 10:27 AM

## 2017-09-14 NOTE — Progress Notes (Addendum)
Patient B/P129/78 Heart Rate 107 Per orders will increase Cardizem 2.5 mg for a total infusion rate of 7.5mg /hr.

## 2017-09-14 NOTE — Progress Notes (Signed)
Earlier, temp of 101.4 noted, now 99.4, on call MD paged because there is no PRN order for fever. Order received for urine, chest x-ray, blood work. Will continue to monitor.

## 2017-09-15 DIAGNOSIS — I5032 Chronic diastolic (congestive) heart failure: Secondary | ICD-10-CM

## 2017-09-15 LAB — CBC
HCT: 44 % (ref 36.0–46.0)
Hemoglobin: 14 g/dL (ref 12.0–15.0)
MCH: 27.7 pg (ref 26.0–34.0)
MCHC: 31.8 g/dL (ref 30.0–36.0)
MCV: 87 fL (ref 78.0–100.0)
Platelets: 93 10*3/uL — ABNORMAL LOW (ref 150–400)
RBC: 5.06 MIL/uL (ref 3.87–5.11)
RDW: 16.7 % — ABNORMAL HIGH (ref 11.5–15.5)
WBC: 5.4 10*3/uL (ref 4.0–10.5)

## 2017-09-15 LAB — RESPIRATORY PANEL BY PCR
Adenovirus: NOT DETECTED
Bordetella pertussis: NOT DETECTED
Chlamydophila pneumoniae: NOT DETECTED
Coronavirus 229E: NOT DETECTED
Coronavirus HKU1: NOT DETECTED
Coronavirus NL63: NOT DETECTED
Coronavirus OC43: NOT DETECTED
Influenza A H3: DETECTED — AB
Influenza B: NOT DETECTED
Metapneumovirus: NOT DETECTED
Mycoplasma pneumoniae: NOT DETECTED
Parainfluenza Virus 1: NOT DETECTED
Parainfluenza Virus 2: NOT DETECTED
Parainfluenza Virus 3: NOT DETECTED
Parainfluenza Virus 4: NOT DETECTED
Respiratory Syncytial Virus: NOT DETECTED
Rhinovirus / Enterovirus: NOT DETECTED

## 2017-09-15 LAB — BASIC METABOLIC PANEL
Anion gap: 12 (ref 5–15)
BUN: 24 mg/dL — ABNORMAL HIGH (ref 6–20)
CO2: 24 mmol/L (ref 22–32)
Calcium: 8.4 mg/dL — ABNORMAL LOW (ref 8.9–10.3)
Chloride: 105 mmol/L (ref 101–111)
Creatinine, Ser: 1.78 mg/dL — ABNORMAL HIGH (ref 0.44–1.00)
GFR calc Af Amer: 32 mL/min — ABNORMAL LOW (ref >60)
GFR calc non Af Amer: 27 mL/min — ABNORMAL LOW (ref >60)
Glucose, Bld: 118 mg/dL — ABNORMAL HIGH (ref 65–99)
Potassium: 4.1 mmol/L (ref 3.5–5.1)
Sodium: 141 mmol/L (ref 135–145)

## 2017-09-15 MED ORDER — CARVEDILOL 12.5 MG PO TABS
12.5000 mg | ORAL_TABLET | Freq: Two times a day (BID) | ORAL | Status: DC
  Administered 2017-09-16 (×2): 12.5 mg via ORAL
  Filled 2017-09-15 (×3): qty 1

## 2017-09-15 MED ORDER — TORSEMIDE 20 MG PO TABS
20.0000 mg | ORAL_TABLET | Freq: Every day | ORAL | Status: DC
  Administered 2017-09-16 – 2017-09-17 (×2): 20 mg via ORAL
  Filled 2017-09-15 (×2): qty 1

## 2017-09-15 MED ORDER — LEVALBUTEROL HCL 0.63 MG/3ML IN NEBU
0.6300 mg | INHALATION_SOLUTION | Freq: Four times a day (QID) | RESPIRATORY_TRACT | Status: DC | PRN
  Administered 2017-09-17 – 2017-09-18 (×2): 0.63 mg via RESPIRATORY_TRACT
  Filled 2017-09-15 (×2): qty 3

## 2017-09-15 MED ORDER — SODIUM CHLORIDE 0.9 % IV BOLUS (SEPSIS)
250.0000 mL | Freq: Once | INTRAVENOUS | Status: AC
  Administered 2017-09-15: 250 mL via INTRAVENOUS

## 2017-09-15 MED ORDER — TORSEMIDE 20 MG PO TABS: 40.0000 mg | ORAL_TABLET | Freq: Every day | ORAL | Status: DC

## 2017-09-15 MED ORDER — BUDESONIDE 0.25 MG/2ML IN SUSP
0.2500 mg | Freq: Two times a day (BID) | RESPIRATORY_TRACT | Status: DC
  Administered 2017-09-15 – 2017-09-17 (×5): 0.25 mg via RESPIRATORY_TRACT
  Filled 2017-09-15 (×8): qty 2

## 2017-09-15 MED ORDER — DILTIAZEM HCL-DEXTROSE 100-5 MG/100ML-% IV SOLN (PREMIX)
5.0000 mg/h | INTRAVENOUS | Status: DC
  Administered 2017-09-17 (×2): 10 mg/h via INTRAVENOUS
  Filled 2017-09-15 (×5): qty 100

## 2017-09-15 MED ORDER — OSELTAMIVIR PHOSPHATE 30 MG PO CAPS
30.0000 mg | ORAL_CAPSULE | Freq: Two times a day (BID) | ORAL | Status: AC
  Administered 2017-09-15 – 2017-09-19 (×10): 30 mg via ORAL
  Filled 2017-09-15 (×10): qty 1

## 2017-09-15 MED ORDER — LEVALBUTEROL HCL 0.63 MG/3ML IN NEBU
0.6300 mg | INHALATION_SOLUTION | Freq: Four times a day (QID) | RESPIRATORY_TRACT | Status: DC
  Administered 2017-09-15 – 2017-09-19 (×11): 0.63 mg via RESPIRATORY_TRACT
  Filled 2017-09-15 (×17): qty 3

## 2017-09-15 MED ORDER — RIVAROXABAN 15 MG PO TABS
15.0000 mg | ORAL_TABLET | Freq: Every day | ORAL | Status: DC
  Administered 2017-09-15 – 2017-09-16 (×2): 15 mg via ORAL
  Filled 2017-09-15 (×2): qty 1

## 2017-09-15 MED ORDER — IPRATROPIUM BROMIDE 0.02 % IN SOLN
0.5000 mg | Freq: Four times a day (QID) | RESPIRATORY_TRACT | Status: DC
  Administered 2017-09-15 – 2017-09-19 (×11): 0.5 mg via RESPIRATORY_TRACT
  Filled 2017-09-15 (×16): qty 2.5

## 2017-09-15 MED ORDER — ARFORMOTEROL TARTRATE 15 MCG/2ML IN NEBU
15.0000 ug | INHALATION_SOLUTION | Freq: Two times a day (BID) | RESPIRATORY_TRACT | Status: DC
  Administered 2017-09-15 – 2017-09-19 (×6): 15 ug via RESPIRATORY_TRACT
  Filled 2017-09-15 (×10): qty 2

## 2017-09-15 NOTE — Progress Notes (Signed)
Spoke with PA Kilroy about patient's B/P 93/63. PA advised that I give 250 bolus of NS and stop cardizem and once patient bolus finishes recheck  B/P and if within order parameters restart Cardizem at 5mg .

## 2017-09-15 NOTE — Progress Notes (Signed)
Progress Note  Patient Name: [name] [name] Date of Encounter: 09/15/2017  Primary Cardiologist: Dr. Debara Pickett  Subjective   This is my first time meeting her but she appears SOB. She is awake and appropriate this am, (paranoid yesterday -possible secondary to fever).  Inpatient Medications    Scheduled Meds: . atorvastatin  10 mg Oral q1800  . carvedilol  12.5 mg Oral BID WC  . famotidine  20 mg Oral Daily  . febuxostat  40 mg Oral Daily  . magnesium oxide  400 mg Oral TID  . oseltamivir  30 mg Oral BID  . potassium chloride SA  40 mEq Oral BID  . rivaroxaban  20 mg Oral Q supper  . sodium chloride flush  3 mL Intravenous Q12H  . [START ON 09/16/2017] torsemide  40 mg Oral Daily   Continuous Infusions: . sodium chloride    . diltiazem (CARDIZEM) infusion 7.5 mg/hr (09/15/17 0532)  . piperacillin-tazobactam (ZOSYN)  IV Stopped (09/15/17 0551)  . sodium chloride     PRN Meds: sodium chloride, acetaminophen, alum & mag hydroxide-simeth, haloperidol lactate, levalbuterol, sodium chloride flush   Vital Signs    Vitals:   09/14/17 2345 09/15/17 0258 09/15/17 0323 09/15/17 0800  BP: 102/64   114/80  Pulse: (!) 103   (!) 117  Resp: 19   20  Temp:   99.7 F (37.6 C) (!) 100.6 F (38.1 C)  TempSrc:    Oral  SpO2: 97%   97%  Weight:  197 lb 12.8 oz (89.7 kg)    Height:        Intake/Output Summary (Last 24 hours) at 09/15/2017 1008 Last data filed at 09/15/2017 0800 Gross per 24 hour  Intake 950 ml  Output 1400 ml  Net -450 ml   Filed Weights   09/13/17 0405 09/14/17 0327 09/15/17 0258  Weight: 197 lb 6.4 oz (89.5 kg) 198 lb 13.7 oz (90.2 kg) 197 lb 12.8 oz (89.7 kg)    Telemetry    Atrial fib RVR 110 - Personally Reviewed  Physical Exam   GEN: No acute distress. But SOB with conversation, in bed on O2 HEENT: Normocephalic, atraumatic, sclera non-icteric. Neck: No JVD or bruits. Cardiac: Irregular, tachycardic, no murmurs, rubs, or gallops.  Radials/DP/PT 1+ and  equal bilaterally.  Respiratory: diffuse wheezing GI: Soft, nontender, non-distended, BS +x 4. MS: no deformity. Extremities: No clubbing or cyanosis. No edema. Distal pedal pulses are 2+ and equal bilaterally. Neuro:  A+O to self, [place]:  Responds to questions appropriately   Labs    Chemistry Recent Labs  Lab 09/13/17 0230 09/14/17 0202 09/15/17 0156  NA 141 141 141  K 4.1 4.1 4.1  CL 105 104 105  CO2 23 24 24   GLUCOSE 114* 120* 118*  BUN 17 18 24*  CREATININE 1.30* 1.44* 1.78*  CALCIUM 9.4 9.4 8.4*  GFRNONAA 40* 35* 27*  GFRAA 46* 41* 32*  ANIONGAP 13 13 12      Hematology Recent Labs  Lab 09/14/17 0202 09/15/17 0156  WBC 5.2 5.4  RBC 5.51* 5.06  HGB 15.4* 14.0  HCT 47.7* 44.0  MCV 86.6 87.0  MCH 27.9 27.7  MCHC 32.3 31.8  RDW 16.4* 16.7*  PLT 116* 93*    Cardiac EnzymesNo results for input(s): TROPONINI in the last 168 hours. No results for input(s): TROPIPOC in the last 168 hours.   BNPNo results for input(s): BNP, PROBNP in the last 168 hours.   DDimer No results  for input(s): DDIMER in the last 168 hours.   Radiology    Dg Chest 2 View  Result Date: 09/14/2017 CLINICAL DATA:  Fever, cough, and lethargy. EXAM: CHEST  2 VIEW COMPARISON:  09/05/2017 FINDINGS: The patient is mildly rotated to the right. The cardiac silhouette remains enlarged. Sequelae of CABG and left atrial appendage clipping are again identified. Pulmonary vascular congestion and diffuse interstitial lung opacities have improved. Mild residual diffuse interstitial prominence is at least partly chronic. No focal airspace consolidation is identified. There is mild scarring in the left mid to lower lung. No sizable pleural effusion or pneumothorax is identified. No acute osseous abnormality is seen. IMPRESSION: Cardiomegaly with improved pulmonary vascular congestion and interstitial edema. Electronically Signed   By: Logan Bores M.D.   On: 09/14/2017 09:14     Cardiac Studies   Limited echo 12/2016 Study Conclusions - Left ventricle: Posterior lateral hypokinesis. The cavity size   was normal. Systolic function was normal. The estimated ejection   fraction was in the range of 50% to 55%. Left ventricular   diastolic function parameters were normal. - Aortic valve: There was mild regurgitation. - Mitral valve: There was mild regurgitation. - Left atrium: The atrium was moderately dilated. - Atrial septum: No defect or patent foramen ovale was identified.  Patient Profile     72 y.o. female with CAD s/p CABG in 2016, ischemic cardiomyopathy with improvement in EF to normal s/p CABG, persistent atrial fibrillation w/CHA2DS2 VASc score of 5 on Xarelto, s/p LAA clipping, HTN, CKD III and questionable medication compliance who presented to the ED on 09/05/17 with complaints of dyspneaand palpitations, found to be in atrial fibrillation w/ RVR and acute on chronic diastolic CHF. In the last 48 hours she contracted Influenza A and has had respiratory decline.   Assessment & Plan    1. Hypoxia with ambulation (not previously on home O2), also now with fever and paranoia - pt was due to be discharged yesterday but DC held because patient was noted to desat to 77% with ambulation. She has tested positive for Influenza A.  2. Persistent atrial fib with RVR - yesterday this was probably secondary to her fever. Better today  3. Acute on chronic diastolic CHF -I/O negative 9L since admission, wgt down 25 lbs. She dropped her B/P last night and her BUN/SCr is trending up. Hold diuretic today. Check BNP in am.   4. CKD III - creatinine up slightly this AM; see above.   5. Minimal troponin elevation with history of CAD s/p CABG - not felt related to ACS this admission.  6. Thrombocytopenia - appears chronic, follow.  Plan: With active wheezing will decrease her beta blocker. With normal LVF we could change this to a more selective beta blocker as well  if needed.    I think she is now mildly dehydrated and will hold Demadex today and decrease dose starting tomorrow but this may need to be cut back further.  For questions or updates, please contact [place] Please consult [email or web address] for contact info under Cardiology/STEMI.  Signed, Kerin Ransom, PA-C 09/15/2017, 10:08 AM     Attending Note:   The patient was seen and examined.  Agree with assessment and plan as noted above.  Changes made to the above note as needed.  Patient seen and independently examined with Kerin Ransom, PA .   We discussed all aspects of the encounter. I agree with the assessment and plan as stated above.  1.  Chronic diastolic chf:   Still volume overloaded but now she has been diagnosed with influenza She has diuresed 25 lbs.   BUN and Cr are trending up.  Will decrease diuretic for now  2. Influenza:   Appreciate assistance of Dr. Tana Coast.  Consider pulmonary consult if she deteriates further   3.  Afib:  Rate is very fast since developing influenza. Cont. Dilt drip Have decreased coreg since she is wheezing so much      I have spent a total of 40 minutes with patient reviewing hospital  notes , telemetry, EKGs, labs and examining patient as well as establishing an assessment and plan that was discussed with the patient. > 50% of time was spent in direct patient care.    Thayer Headings, Brooke Bonito., MD, [facility] 09/15/2017, 1:00 PM [address]  [place] Pager [phone]

## 2017-09-15 NOTE — Progress Notes (Signed)
[facility] consult progress note                                                                               Patient Demographics  [name] [name], is a 72 y.o. female, DOB - [birth year], OZD:[id number]  Admit date - 09/05/2017   Admitting Physician [name], MD  Outpatient Primary MD for the patient is [name], MD  Outpatient specialists:   LOS - 10  days   Medical records reviewed and are as summarized below:    Chief Complaint  Patient presents with  . Atrial Fibrillation       Brief summary   [name] [name] is an 72 y.o. female is a 72yo female admitted to cardiology on 1/22 due to atrial fibrillation with RVR. Pt treated with Cardizem and has had rate control.  Pt was decided to not be a candidate for ablation.  Pt was scheduled to be discharged on 1/30 with home health and follow up in A fib clinic.  Pt was noted to have develop a fever of 101.4, spiked to 103 F with coughing, subsequently developed confusion and paranoia.  [facility] was consulted for the above.  Assessment & Plan    Principal Problem:   Atrial fibrillation with RVR ([place]) -Management per cardiology, primary service -Discussed with Dr. Acie Fredrickson, discontinue albuterol.  Placed on scheduled Xopenex, Atrovent every 6 hours and Xopenex as needed  Active Problems:  SIRS/fevers likely due to influenza A wheezing -Patient met sepsis criteria due to fever, tachycardia and tachypnea. -Patient has positive influenza A PCR, started on Tamiflu this morning -Discontinued vancomycin.  Follow blood cultures today, if no fevers for next 24 hours and blood cultures negative, will discontinue Zosyn as well.  Pro-calcitonin 0.19 -Placed on scheduled Xopenex, Atrovent nebs every 6 hours and Xopenex as needed, Pulmicort, Brovana -Placed on flutter valve per RT -Holding off on oral steroids for now as patient had significant paranoia/confusion  yesterday   Confusion/paranoia/acute metabolic encephalopathy -Saw the patient for the first time today, confusion seems to have improved, patient is alert and oriented x3 at this time  Recent acute gout -Patient was treated outpatient for acute gout, currently not complaining of any pain in her right foot. -Continue uloric  chronic thrombocytopenia -Monitor counts closely  Ischemic cardiomyopathy, acute on chronic diastolic CHF, CAD status post CABG x2, HTN -Per cardiology, primary service   Code Status: full  DVT Prophylaxis: On Xarelto Family Communication: Discussed in detail with the patient, all imaging results, lab results explained to the patient   Disposition Plan: Not medically ready today  Time Spent in minutes  25 minutes  Procedures:    Antimicrobials:   IV vancomycin 1/31> 2/1  IV Zosyn 1/31>  Tamiflu 2/1 day#1 today    Medications  Scheduled Meds: . arformoterol  15 mcg Nebulization BID  . atorvastatin  10 mg Oral q1800  . budesonide (PULMICORT) nebulizer solution  0.25 mg Nebulization BID  . carvedilol  12.5 mg Oral BID WC  . famotidine  20 mg Oral Daily  . febuxostat  40 mg Oral Daily  . levalbuterol  0.63  mg Nebulization Q6H   And  . ipratropium  0.5 mg Nebulization Q6H  . magnesium oxide  400 mg Oral TID  . oseltamivir  30 mg Oral BID  . potassium chloride SA  40 mEq Oral BID  . rivaroxaban  15 mg Oral Q supper  . sodium chloride flush  3 mL Intravenous Q12H  . [START ON 09/16/2017] torsemide  40 mg Oral Daily   Continuous Infusions: . sodium chloride    . diltiazem (CARDIZEM) infusion 7.5 mg/hr (09/15/17 0532)  . piperacillin-tazobactam (ZOSYN)  IV 3.375 g (09/15/17 1029)  . sodium chloride     PRN Meds:.sodium chloride, acetaminophen, alum & mag hydroxide-simeth, haloperidol lactate, levalbuterol, sodium chloride flush   Antibiotics   Anti-infectives (From admission, onward)   Start     Dose/Rate Route Frequency Ordered Stop    09/15/17 0800  vancomycin (VANCOCIN) 1,250 mg in sodium chloride 0.9 % 250 mL IVPB  Status:  Discontinued     1,250 mg 166.7 mL/hr over 90 Minutes Intravenous Every 24 hours 09/14/17 1027 09/15/17 0913   09/15/17 0630  oseltamivir (TAMIFLU) capsule 30 mg     30 mg Oral 2 times daily 09/15/17 0618 09/20/17 0959   09/14/17 1800  piperacillin-tazobactam (ZOSYN) IVPB 3.375 g     3.375 g 12.5 mL/hr over 240 Minutes Intravenous Every 8 hours 09/14/17 1027     09/14/17 1030  vancomycin (VANCOCIN) 1,500 mg in sodium chloride 0.9 % 500 mL IVPB     1,500 mg 250 mL/hr over 120 Minutes Intravenous  Once 09/14/17 1027 09/14/17 1442   09/14/17 1015  vancomycin (VANCOCIN) IVPB 1000 mg/200 mL premix  Status:  Discontinued     1,000 mg 200 mL/hr over 60 Minutes Intravenous  Once 09/14/17 0935 09/14/17 1730   09/14/17 0945  piperacillin-tazobactam (ZOSYN) IVPB 3.375 g     3.375 g 100 mL/hr over 30 Minutes Intravenous  Once 09/14/17 0935 09/14/17 1143        Subjective:   [name] was seen and examined today.  Still low-grade fevers, tmax 101.4 F this morning.  alert and oriented but has some wheezing.  Patient denies dizziness, chest pain, shortness of breath, abdominal pain, N/V/D/C. Marland Kitchen    Objective:   Vitals:   09/15/17 0800 09/15/17 1000 09/15/17 1105 09/15/17 1111  BP: 114/80   93/63  Pulse: (!) 117  (!) 106   Resp: 20  20   Temp: (!) 100.6 F (38.1 C) (!) 101.4 F (38.6 C)  (!) 100.4 F (38 C)  TempSrc: Oral Oral  Oral  SpO2: 97%  96% 98%  Weight:      Height:        Intake/Output Summary (Last 24 hours) at 09/15/2017 1152 Last data filed at 09/15/2017 0800 Gross per 24 hour  Intake 1190 ml  Output 1400 ml  Net -210 ml     Wt Readings from Last 3 Encounters:  09/15/17 89.7 kg (197 lb 12.8 oz)  08/16/17 101.1 kg (222 lb 12.8 oz)  08/06/17 99.2 kg (218 lb 9.6 oz)     Exam  General: Alert and oriented x 3, NAD, responds appropriately  Eyes:   HEENT:     Cardiovascular: Irregularly irregular, no MRG, tachycardia  Respiratory: Diffuse expiratory wheezing  Gastrointestinal: Soft, nontender, nondistended, + bowel sounds  Ext: no pedal edema bilaterally  Neuro: no new deficits  Musculoskeletal: No digital cyanosis, clubbing  Skin: No rashes  Psych: Normal affect and demeanor, alert and oriented x3  Data Reviewed:  I have personally reviewed following labs and imaging studies  Micro Results Recent Results (from the past 240 hour(s))  MRSA PCR Screening     Status: None   Collection Time: 09/05/17  7:18 PM  Result Value Ref Range Status   MRSA by PCR NEGATIVE NEGATIVE Final    Comment:        The GeneXpert MRSA Assay (FDA approved for NASAL specimens only), is one component of a comprehensive MRSA colonization surveillance program. It is not intended to diagnose MRSA infection nor to guide or monitor treatment for MRSA infections.   Respiratory Panel by PCR     Status: Abnormal   Collection Time: 09/14/17  8:10 AM  Result Value Ref Range Status   Adenovirus NOT DETECTED NOT DETECTED Final   Coronavirus 229E NOT DETECTED NOT DETECTED Final   Coronavirus HKU1 NOT DETECTED NOT DETECTED Final   Coronavirus NL63 NOT DETECTED NOT DETECTED Final   Coronavirus OC43 NOT DETECTED NOT DETECTED Final   Metapneumovirus NOT DETECTED NOT DETECTED Final   Rhinovirus / Enterovirus NOT DETECTED NOT DETECTED Final   Influenza A H3 DETECTED (A) NOT DETECTED Final   Influenza B NOT DETECTED NOT DETECTED Final   Parainfluenza Virus 1 NOT DETECTED NOT DETECTED Final   Parainfluenza Virus 2 NOT DETECTED NOT DETECTED Final   Parainfluenza Virus 3 NOT DETECTED NOT DETECTED Final   Parainfluenza Virus 4 NOT DETECTED NOT DETECTED Final   Respiratory Syncytial Virus NOT DETECTED NOT DETECTED Final   Bordetella pertussis NOT DETECTED NOT DETECTED Final   Chlamydophila pneumoniae NOT DETECTED NOT DETECTED Final   Mycoplasma pneumoniae NOT  DETECTED NOT DETECTED Final    Radiology Reports Dg Chest 2 View  Result Date: 09/14/2017 CLINICAL DATA:  Fever, cough, and lethargy. EXAM: CHEST  2 VIEW COMPARISON:  09/05/2017 FINDINGS: The patient is mildly rotated to the right. The cardiac silhouette remains enlarged. Sequelae of CABG and left atrial appendage clipping are again identified. Pulmonary vascular congestion and diffuse interstitial lung opacities have improved. Mild residual diffuse interstitial prominence is at least partly chronic. No focal airspace consolidation is identified. There is mild scarring in the left mid to lower lung. No sizable pleural effusion or pneumothorax is identified. No acute osseous abnormality is seen. IMPRESSION: Cardiomegaly with improved pulmonary vascular congestion and interstitial edema. Electronically Signed   By: Logan Bores M.D.   On: 09/14/2017 09:14   Dg Chest Portable 1 View  Result Date: 09/05/2017 CLINICAL DATA:  Short of breath for 3 days EXAM: PORTABLE CHEST 1 VIEW COMPARISON:  08/04/2017 FINDINGS: Sternotomy wires overlie stable cardiac silhouette. Bibasilar atelectasis. Mild central venous congestion. Mild peripheral interstitial edema. No focal consolidation. No pneumothorax. IMPRESSION: 1. Mild increase in interstitial edema pattern. 2. Mild bibasilar atelectasis. Electronically Signed   By: Suzy Bouchard M.D.   On: 09/05/2017 09:09    Lab Data:  CBC: Recent Labs  Lab 09/14/17 0202 09/15/17 0156  WBC 5.2 5.4  HGB 15.4* 14.0  HCT 47.7* 44.0  MCV 86.6 87.0  PLT 116* 93*   Basic Metabolic Panel: Recent Labs  Lab 09/10/17 0334 09/12/17 1035 09/13/17 0230 09/14/17 0202 09/15/17 0156  NA 141 143 141 141 141  K 4.5 3.7 4.1 4.1 4.1  CL 108 105 105 104 105  CO2 _0 GLUCOSE 121* 125* 114* 120* 118*  BUN _1 24*  CREATININE 1.21* 1.27* 1.30* 1.44* 1.78*  CALCIUM 8.9 9.6 9.4 9.4  8.4*   GFR: Estimated Creatinine Clearance: 32.8 mL/min (A) (by C-G  formula based on SCr of 1.78 mg/dL (H)). Liver Function Tests: No results for input(s): AST, ALT, ALKPHOS, BILITOT, PROT, ALBUMIN in the last 168 hours. No results for input(s): LIPASE, AMYLASE in the last 168 hours. No results for input(s): AMMONIA in the last 168 hours. Coagulation Profile: No results for input(s): INR, PROTIME in the last 168 hours. Cardiac Enzymes: No results for input(s): CKTOTAL, CKMB, CKMBINDEX, TROPONINI in the last 168 hours. BNP (last 3 results) No results for input(s): PROBNP in the last 8760 hours. HbA1C: No results for input(s): HGBA1C in the last 72 hours. CBG: No results for input(s): GLUCAP in the last 168 hours. Lipid Profile: No results for input(s): CHOL, HDL, LDLCALC, TRIG, CHOLHDL, LDLDIRECT in the last 72 hours. Thyroid Function Tests: No results for input(s): TSH, T4TOTAL, FREET4, T3FREE, THYROIDAB in the last 72 hours. Anemia Panel: No results for input(s): VITAMINB12, FOLATE, FERRITIN, TIBC, IRON, RETICCTPCT in the last 72 hours. Urine analysis:    Component Value Date/Time   COLORURINE YELLOW 09/14/2017 0336   APPEARANCEUR CLEAR 09/14/2017 0336   LABSPEC 1.010 09/14/2017 0336   PHURINE 6.0 09/14/2017 0336   GLUCOSEU NEGATIVE 09/14/2017 0336   HGBUR NEGATIVE 09/14/2017 0336   BILIRUBINUR NEGATIVE 09/14/2017 0336   BILIRUBINUR ng 12/25/2013 1342   KETONESUR NEGATIVE 09/14/2017 0336   PROTEINUR NEGATIVE 09/14/2017 0336   UROBILINOGEN 0.2 04/11/2017 1402   NITRITE NEGATIVE 09/14/2017 0336   LEUKOCYTESUR NEGATIVE 09/14/2017 0336       M.D. [facility] 09/15/2017, 11:52 AM  Pager: [phone] Between 7am to 7pm - call Pager - [phone]  After 7pm go to [email or web address] - password [email or web address]  Call night coverage person covering after 7pm

## 2017-09-15 NOTE — Progress Notes (Signed)
Physical Therapy Treatment Patient Details Name: [name] [name] MRN: [id number] DOB: 05-05-1946 Today's Date: 09/15/2017    History of Present Illness Pt adm with afib with rvr and acute heart failure. Pt also developed painful rt foot. Pt planned to d/c on 09/13/17 but desaturated on RA with activity to 77%, kept for observation, developed a fever and tested positive for influenza A. PMH - CAD, s/p CABG, parox afib, HTN, ICM, gout    PT Comments    Pt resting in bed on arrival and more alert from RN report.  Pt tolerated supine therapeutic exercises.  BP remains soft so OOB mobility deferred as patient actively receiving a bolus.  Post exercise patient BP read 76/42, RN changed BP cuff size and reassessed BP and obtained reading of 97/60.  Will plan for OOB mobility as patient improves medically.    Follow Up Recommendations  Home health PT     Equipment Recommendations  (rollator 4 wheeled RW with a seat.  )    Recommendations for Other Services       Precautions / Restrictions Precautions Precautions: Fall Restrictions Weight Bearing Restrictions: No    Mobility  Bed Mobility               General bed mobility comments: PTA deferred tx to bed due to soft BPs and actively receiving a bolus to improve her BP.    Transfers                    Ambulation/Gait                 Stairs            Wheelchair Mobility    Modified Rankin (Stroke Patients Only)       Balance                                            Cognition Arousal/Alertness: Awake/alert Behavior During Therapy: Impulsive Overall Cognitive Status: Impaired/Different from baseline Area of Impairment: Following commands;Awareness;Problem solving                               General Comments: Unable to assess mobility during session due to low BPs.  Pt intermittently closing her eyes during session.        Exercises General Exercises - Lower  Extremity Ankle Circles/Pumps: AROM;Both;20 reps;Supine Short Arc Quad: AROM;Both;10 reps;Supine Heel Slides: 10 reps;Supine;AAROM(to avoid ER with movement.  ) Hip ABduction/ADduction: AROM;Both;10 reps;Supine Straight Leg Raises: AAROM;Both;10 reps;Supine(poor eccentric loading with noticeable extensor lag.  )    General Comments        Pertinent Vitals/Pain Pain Assessment: No/denies pain Pain Location: rt foot Pain Descriptors / Indicators: Grimacing Pain Intervention(s): Monitored during session;Repositioned    Home Living                      Prior Function            PT Goals (current goals can now be found in the care plan section) Acute Rehab PT Goals Patient Stated Goal: get out of the bed Potential to Achieve Goals: Good Progress towards PT goals: Progressing toward goals    Frequency    Min 3X/week      PT Plan Current plan remains appropriate  Co-evaluation              AM-PAC PT "6 Clicks" Daily Activity  Outcome Measure  Difficulty turning over in bed (including adjusting bedclothes, sheets and blankets)?: None Difficulty moving from lying on back to sitting on the side of the bed? : None Difficulty sitting down on and standing up from a chair with arms (e.g., wheelchair, bedside commode, etc,.)?: Unable Help needed moving to and from a bed to chair (including a wheelchair)?: A Little Help needed walking in hospital room?: A Little Help needed climbing 3-5 steps with a railing? : A Little 6 Click Score: 18    End of Session Equipment Utilized During Treatment: Gait belt;Oxygen Activity Tolerance: Patient tolerated treatment well;Treatment limited secondary to medical complications (Comment) Patient left: in chair;with call bell/phone within reach;with nursing/sitter in room;with family/visitor present Nurse Communication: Mobility status PT Visit Diagnosis: Unsteadiness on feet (R26.81);Muscle weakness (generalized)  (M62.81);Difficulty in walking, not elsewhere classified (R26.2)     Time: 5638-9373 PT Time Calculation (min) (ACUTE ONLY): 18 min  Charges:  $Therapeutic Exercise: 8-22 mins                    G Codes:       Governor [place], PTA pager [phone]    Cristela Blue 09/15/2017, 2:18 PM

## 2017-09-15 NOTE — Progress Notes (Signed)
Patient B/P 111/72, restarted Cardizem at 5mg . Will continue to monitor B/P and adjust dosage as necessary. Patient is alert and oriented and sitting up in the bed.

## 2017-09-16 LAB — BASIC METABOLIC PANEL
Anion gap: 10 (ref 5–15)
BUN: 20 mg/dL (ref 6–20)
CO2: 24 mmol/L (ref 22–32)
Calcium: 8.6 mg/dL — ABNORMAL LOW (ref 8.9–10.3)
Chloride: 108 mmol/L (ref 101–111)
Creatinine, Ser: 1.6 mg/dL — ABNORMAL HIGH (ref 0.44–1.00)
GFR calc Af Amer: 36 mL/min — ABNORMAL LOW (ref >60)
GFR calc non Af Amer: 31 mL/min — ABNORMAL LOW (ref >60)
Glucose, Bld: 120 mg/dL — ABNORMAL HIGH (ref 65–99)
Potassium: 4.4 mmol/L (ref 3.5–5.1)
Sodium: 142 mmol/L (ref 135–145)

## 2017-09-16 LAB — URINE CULTURE: Culture: NO GROWTH

## 2017-09-16 LAB — BRAIN NATRIURETIC PEPTIDE: B Natriuretic Peptide: 318.5 pg/mL — ABNORMAL HIGH (ref 0.0–100.0)

## 2017-09-16 MED ORDER — METHYLPREDNISOLONE SODIUM SUCC 40 MG IJ SOLR
40.0000 mg | Freq: Two times a day (BID) | INTRAMUSCULAR | Status: DC
Start: 2017-09-16 — End: 2017-09-17
  Administered 2017-09-16 (×2): 40 mg via INTRAVENOUS
  Filled 2017-09-16 (×2): qty 1

## 2017-09-16 NOTE — Progress Notes (Signed)
09/16/2017 1520 Pt ambulated on RA with rolling walker down to nursing station and back.  Pt tolerated well.  Pt had been up in chair for a few hours prior to her walk.  Pt has asked to go back to bed.  Call light within reach and bed alarm is on. [place]

## 2017-09-16 NOTE — Progress Notes (Signed)
[facility] consult progress note                                                                               Patient Demographics  [name] [name], is a 72 y.o. female, DOB - 1946-08-06, GMW:[id number]  Admit date - 09/05/2017   Admitting Physician [name], MD  Outpatient Primary MD for the patient is [name], MD  Outpatient specialists:   LOS - 11  days   Medical records reviewed and are as summarized below:    Chief Complaint  Patient presents with  . Atrial Fibrillation       Brief summary   [name] [name] is an 72 y.o. female is a 72yo female admitted to cardiology on 1/22 due to atrial fibrillation with RVR. Pt treated with Cardizem and has had rate control.  Pt was decided to not be a candidate for ablation.  Pt was scheduled to be discharged on 1/30 with home health and follow up in A fib clinic.  Pt was noted to have develop a fever of 101.4, spiked to 103 F with coughing, subsequently developed confusion and paranoia.  [facility] was consulted for the above.  Assessment & Plan    Principal Problem:   Atrial fibrillation with RVR (HCC) -Management per cardiology, primary service - Placed on scheduled Xopenex, Atrovent every 6 hours and Xopenex as needed  Active Problems:  SIRS/fevers likely due to influenza A wheezing -Patient met sepsis criteria due to fever, tachycardia and tachypnea. -Patient has positive influenza A PCR, started on Tamiflu on 2/1, day #2 today -Discontinued vancomycin.  Blood cultures negative so far, diffusely wheezing hence we will continue Zosyn for now. Pro-calcitonin 0.19 -Continue scheduled Xopenex, Atrovent nebs every 6 hours and Xopenex as needed, Pulmicort, Brovana, flutter valve -Patient was somewhat audibly wheezing at the time of my encounter, hence placed on IV Solu-Medrol 40 mg q12hrs.   Confusion/paranoia/acute metabolic encephalopathy -At this time alert and oriented x3, follow  closely with addition of IV steroids today  Recent acute gout -Patient was treated outpatient for acute gout, currently not complaining of any pain in her right foot. -Continue uloric  chronic thrombocytopenia -Platelet count decreased to 93 today, monitor closely   Ischemic cardiomyopathy, acute on chronic diastolic CHF, CAD status post CABG x2, HTN -Per cardiology, primary service   Code Status: full  DVT Prophylaxis: On Xarelto Family Communication: Discussed in detail with the patient, all imaging results, lab results explained to the patient   Disposition Plan: Not medically ready today  Time Spent in minutes 15 minutes  Procedures:    Antimicrobials:   IV vancomycin 1/31> 2/1  IV Zosyn 1/31>  Tamiflu 2/1 day # 2 today    Medications  Scheduled Meds: . arformoterol  15 mcg Nebulization BID  . atorvastatin  10 mg Oral q1800  . budesonide (PULMICORT) nebulizer solution  0.25 mg Nebulization BID  . carvedilol  12.5 mg Oral BID WC  . famotidine  20 mg Oral Daily  . febuxostat  40 mg Oral Daily  . levalbuterol  0.63 mg Nebulization Q6H   And  . ipratropium  0.5 mg Nebulization Q6H  . magnesium oxide  400 mg Oral TID  . methylPREDNISolone (SOLU-MEDROL) injection  40 mg Intravenous Q12H  . oseltamivir  30 mg Oral BID  . potassium chloride SA  40 mEq Oral BID  . rivaroxaban  15 mg Oral Q supper  . sodium chloride flush  3 mL Intravenous Q12H  . torsemide  20 mg Oral Daily   Continuous Infusions: . sodium chloride    . diltiazem (CARDIZEM) infusion 5 mg/hr (09/15/17 1430)  . piperacillin-tazobactam (ZOSYN)  IV 3.375 g (09/16/17 1052)  . sodium chloride     PRN Meds:.sodium chloride, acetaminophen, alum & mag hydroxide-simeth, haloperidol lactate, levalbuterol, sodium chloride flush   Antibiotics   Anti-infectives (From admission, onward)   Start     Dose/Rate Route Frequency Ordered Stop   09/15/17 0800  vancomycin (VANCOCIN) 1,250 mg in sodium chloride  0.9 % 250 mL IVPB  Status:  Discontinued     1,250 mg 166.7 mL/hr over 90 Minutes Intravenous Every 24 hours 09/14/17 1027 09/15/17 0913   09/15/17 0630  oseltamivir (TAMIFLU) capsule 30 mg     30 mg Oral 2 times daily 09/15/17 0618 09/20/17 0959   09/14/17 1800  piperacillin-tazobactam (ZOSYN) IVPB 3.375 g     3.375 g 12.5 mL/hr over 240 Minutes Intravenous Every 8 hours 09/14/17 1027     09/14/17 1030  vancomycin (VANCOCIN) 1,500 mg in sodium chloride 0.9 % 500 mL IVPB     1,500 mg 250 mL/hr over 120 Minutes Intravenous  Once 09/14/17 1027 09/14/17 1442   09/14/17 1015  vancomycin (VANCOCIN) IVPB 1000 mg/200 mL premix  Status:  Discontinued     1,000 mg 200 mL/hr over 60 Minutes Intravenous  Once 09/14/17 0935 09/14/17 1730   09/14/17 0945  piperacillin-tazobactam (ZOSYN) IVPB 3.375 g     3.375 g 100 mL/hr over 30 Minutes Intravenous  Once 09/14/17 0935 09/14/17 1143        Subjective:   [name] [name] was seen and examined today.  No fevers but wheezing diffusely.  Alert and oriented x3. Patient denies dizziness, chest pain, abdominal pain, N/V/D/C.   Objective:   Vitals:   09/16/17 0804 09/16/17 0805 09/16/17 0900 09/16/17 1200  BP:    (!) 123/93  Pulse:    (!) 121  Resp:    (!) 29  Temp:   98.2 F (36.8 C) 98 F (36.7 C)  TempSrc:   Oral Oral  SpO2: 97% 97%  96%  Weight:      Height:        Intake/Output Summary (Last 24 hours) at 09/16/2017 1243 Last data filed at 09/16/2017 1228 Gross per 24 hour  Intake 1200 ml  Output 450 ml  Net 750 ml     Wt Readings from Last 3 Encounters:  09/16/17 91.8 kg (202 lb 4.8 oz)  08/16/17 101.1 kg (222 lb 12.8 oz)  08/06/17 99.2 kg (218 lb 9.6 oz)     Exam   General: Alert and oriented x 3, NAD  Eyes:   HEENT:    Cardiovascular: Irregularly irregular, no MRG.    Respiratory: Diffuse expiratory wheezing bilaterally  Gastrointestinal: Soft, nontender, nondistended, + bowel sounds  Ext: no pedal edema  bilaterally  Neuro: no new deficits  Musculoskeletal: No digital cyanosis, clubbing  Skin: No rashes  Psych: Normal affect and demeanor, alert and oriented x3    Data Reviewed:  I have personally reviewed following labs and imaging studies  Micro Results Recent  Results (from the past 240 hour(s))  Culture, blood (routine x 2)     Status: None (Preliminary result)   Collection Time: 09/14/17  2:08 AM  Result Value Ref Range Status   Specimen Description BLOOD LEFT HAND  Final   Special Requests   Final    BOTTLES DRAWN AEROBIC AND ANAEROBIC Blood Culture adequate volume   Culture   Final    NO GROWTH 2 DAYS Performed at [facility], [address], [place], [place] [address]    Report Status PENDING  Incomplete  Culture, blood (routine x 2)     Status: None (Preliminary result)   Collection Time: 09/14/17  2:16 AM  Result Value Ref Range Status   Specimen Description BLOOD RIGHT HAND  Final   Special Requests IN PEDIATRIC BOTTLE Blood Culture adequate volume  Final   Culture   Final    NO GROWTH 2 DAYS Performed at [facility], [place] [address], [place], [place] [address]    Report Status PENDING  Incomplete  Respiratory Panel by PCR     Status: Abnormal   Collection Time: 09/14/17  8:10 AM  Result Value Ref Range Status   Adenovirus NOT DETECTED NOT DETECTED Final   Coronavirus 229E NOT DETECTED NOT DETECTED Final   Coronavirus HKU1 NOT DETECTED NOT DETECTED Final   Coronavirus NL63 NOT DETECTED NOT DETECTED Final   Coronavirus OC43 NOT DETECTED NOT DETECTED Final   Metapneumovirus NOT DETECTED NOT DETECTED Final   Rhinovirus / Enterovirus NOT DETECTED NOT DETECTED Final   Influenza A H3 DETECTED (A) NOT DETECTED Final   Influenza B NOT DETECTED NOT DETECTED Final   Parainfluenza Virus 1 NOT DETECTED NOT DETECTED Final   Parainfluenza Virus 2 NOT DETECTED NOT DETECTED Final   Parainfluenza Virus 3 NOT DETECTED NOT DETECTED Final   Parainfluenza Virus 4  NOT DETECTED NOT DETECTED Final   Respiratory Syncytial Virus NOT DETECTED NOT DETECTED Final   Bordetella pertussis NOT DETECTED NOT DETECTED Final   Chlamydophila pneumoniae NOT DETECTED NOT DETECTED Final   Mycoplasma pneumoniae NOT DETECTED NOT DETECTED Final  Urine culture     Status: None   Collection Time: 09/14/17  9:02 AM  Result Value Ref Range Status   Specimen Description URINE, CLEAN CATCH  Final   Special Requests NONE  Final   Culture   Final    NO GROWTH Performed at [facility], [address] [place], [place] [address]    Report Status 09/16/2017 FINAL  Final    Radiology Reports Dg Chest 2 View  Result Date: 09/14/2017 CLINICAL DATA:  Fever, cough, and lethargy. EXAM: CHEST  2 VIEW COMPARISON:  09/05/2017 FINDINGS: The patient is mildly rotated to the right. The cardiac silhouette remains enlarged. Sequelae of CABG and left atrial appendage clipping are again identified. Pulmonary vascular congestion and diffuse interstitial lung opacities have improved. Mild residual diffuse interstitial prominence is at least partly chronic. No focal airspace consolidation is identified. There is mild scarring in the left mid to lower lung. No sizable pleural effusion or pneumothorax is identified. No acute osseous abnormality is seen. IMPRESSION: Cardiomegaly with improved pulmonary vascular congestion and interstitial edema. Electronically Signed   By: Logan Bores M.D.   On: 09/14/2017 09:14   Dg Chest Portable 1 View  Result Date: 09/05/2017 CLINICAL DATA:  Short of breath for 3 days EXAM: PORTABLE CHEST 1 VIEW COMPARISON:  08/04/2017 FINDINGS: Sternotomy wires overlie stable cardiac silhouette. Bibasilar  atelectasis. Mild central venous congestion. Mild peripheral interstitial edema. No focal consolidation. No pneumothorax. IMPRESSION: 1. Mild increase in interstitial edema pattern. 2. Mild bibasilar atelectasis. Electronically Signed   By: Suzy Bouchard M.D.   On:  09/05/2017 09:09    Lab Data:  CBC: Recent Labs  Lab 09/14/17 0202 09/15/17 0156  WBC 5.2 5.4  HGB 15.4* 14.0  HCT 47.7* 44.0  MCV 86.6 87.0  PLT 116* 93*   Basic Metabolic Panel: Recent Labs  Lab 09/12/17 1035 09/13/17 0230 09/14/17 0202 09/15/17 0156 09/16/17 0142  NA 143 141 141 141 142  K 3.7 4.1 4.1 4.1 4.4  CL 105 105 104 105 108  CO2 '26 23 24 24 24  ' GLUCOSE 125* 114* 120* 118* 120*  BUN '16 17 18 ' 24* 20  CREATININE 1.27* 1.30* 1.44* 1.78* 1.60*  CALCIUM 9.6 9.4 9.4 8.4* 8.6*   GFR: Estimated Creatinine Clearance: 37 mL/min (A) (by C-G formula based on SCr of 1.6 mg/dL (H)). Liver Function Tests: No results for input(s): AST, ALT, ALKPHOS, BILITOT, PROT, ALBUMIN in the last 168 hours. No results for input(s): LIPASE, AMYLASE in the last 168 hours. No results for input(s): AMMONIA in the last 168 hours. Coagulation Profile: No results for input(s): INR, PROTIME in the last 168 hours. Cardiac Enzymes: No results for input(s): CKTOTAL, CKMB, CKMBINDEX, TROPONINI in the last 168 hours. BNP (last 3 results) No results for input(s): PROBNP in the last 8760 hours. HbA1C: No results for input(s): HGBA1C in the last 72 hours. CBG: No results for input(s): GLUCAP in the last 168 hours. Lipid Profile: No results for input(s): CHOL, HDL, LDLCALC, TRIG, CHOLHDL, LDLDIRECT in the last 72 hours. Thyroid Function Tests: No results for input(s): TSH, T4TOTAL, FREET4, T3FREE, THYROIDAB in the last 72 hours. Anemia Panel: No results for input(s): VITAMINB12, FOLATE, FERRITIN, TIBC, IRON, RETICCTPCT in the last 72 hours. Urine analysis:    Component Value Date/Time   COLORURINE YELLOW 09/14/2017 0336   APPEARANCEUR CLEAR 09/14/2017 0336   LABSPEC 1.010 09/14/2017 0336   PHURINE 6.0 09/14/2017 0336   GLUCOSEU NEGATIVE 09/14/2017 0336   HGBUR NEGATIVE 09/14/2017 0336   BILIRUBINUR NEGATIVE 09/14/2017 0336   BILIRUBINUR ng 12/25/2013 1342   KETONESUR NEGATIVE  09/14/2017 0336   PROTEINUR NEGATIVE 09/14/2017 0336   UROBILINOGEN 0.2 04/11/2017 1402   NITRITE NEGATIVE 09/14/2017 0336   LEUKOCYTESUR NEGATIVE 09/14/2017 0336     Sutton Hirsch M.D. [facility] 09/16/2017, 12:43 PM  Pager: [phone] Between 7am to 7pm - call Pager - [phone]  After 7pm go to [email or web address] - password [email or web address]  Call night coverage person covering after 7pm

## 2017-09-16 NOTE — Progress Notes (Signed)
Progress Note  Patient Name: [name] [name] Date of Encounter: 09/16/2017  Primary Cardiologist:   [name], MD   Subjective   She wants to go home but clearly is not ready for this.  She is wheezing and has intermittent tachypnea.   Inpatient Medications    Scheduled Meds: . arformoterol  15 mcg Nebulization BID  . atorvastatin  10 mg Oral q1800  . budesonide (PULMICORT) nebulizer solution  0.25 mg Nebulization BID  . carvedilol  12.5 mg Oral BID WC  . famotidine  20 mg Oral Daily  . febuxostat  40 mg Oral Daily  . levalbuterol  0.63 mg Nebulization Q6H   And  . ipratropium  0.5 mg Nebulization Q6H  . magnesium oxide  400 mg Oral TID  . methylPREDNISolone (SOLU-MEDROL) injection  40 mg Intravenous Q12H  . oseltamivir  30 mg Oral BID  . potassium chloride SA  40 mEq Oral BID  . rivaroxaban  15 mg Oral Q supper  . sodium chloride flush  3 mL Intravenous Q12H  . torsemide  20 mg Oral Daily   Continuous Infusions: . sodium chloride    . diltiazem (CARDIZEM) infusion 5 mg/hr (09/15/17 1430)  . piperacillin-tazobactam (ZOSYN)  IV 3.375 g (09/16/17 1052)  . sodium chloride     PRN Meds: sodium chloride, acetaminophen, alum & mag hydroxide-simeth, haloperidol lactate, levalbuterol, sodium chloride flush   Vital Signs    Vitals:   09/16/17 0802 09/16/17 0804 09/16/17 0805 09/16/17 0900  BP:      Pulse:      Resp:      Temp:    98.2 F (36.8 C)  TempSrc:    Oral  SpO2: 97% 97% 97%   Weight:      Height:        Intake/Output Summary (Last 24 hours) at 09/16/2017 1150 Last data filed at 09/16/2017 0840 Gross per 24 hour  Intake 960 ml  Output 450 ml  Net 510 ml   Filed Weights   09/14/17 0327 09/15/17 0258 09/16/17 0443  Weight: 198 lb 13.7 oz (90.2 kg) 197 lb 12.8 oz (89.7 kg) 202 lb 4.8 oz (91.8 kg)    Telemetry    Atrial fib with rapid rate - Personally Reviewed  ECG    NA  - Personally Reviewed  Physical Exam   GEN: No acute distress.     Neck: No  JVD Cardiac: Irregular RR, no murmurs, rubs, or gallops.  Respiratory:   Decreased breath sounds with expiratory wheezing GI: Soft, nontender, non-distended  MS: No  edema; No deformity. Neuro:  Nonfocal  Psych: Normal affect but somewhat upset  Labs    Chemistry Recent Labs  Lab 09/14/17 0202 09/15/17 0156 09/16/17 0142  NA 141 141 142  K 4.1 4.1 4.4  CL 104 105 108  CO2 24 24 24   GLUCOSE 120* 118* 120*  BUN 18 24* 20  CREATININE 1.44* 1.78* 1.60*  CALCIUM 9.4 8.4* 8.6*  GFRNONAA 35* 27* 31*  GFRAA 41* 32* 36*  ANIONGAP 13 12 10      Hematology Recent Labs  Lab 09/14/17 0202 09/15/17 0156  WBC 5.2 5.4  RBC 5.51* 5.06  HGB 15.4* 14.0  HCT 47.7* 44.0  MCV 86.6 87.0  MCH 27.9 27.7  MCHC 32.3 31.8  RDW 16.4* 16.7*  PLT 116* 93*    Cardiac EnzymesNo results for input(s): TROPONINI in the last 168 hours. No results for input(s): TROPIPOC in the last 168 hours.  BNP Recent Labs  Lab 09/16/17 0142  BNP 318.5*     DDimer No results for input(s): DDIMER in the last 168 hours.   Radiology    No results found.  Cardiac Studies   NA  Patient Profile    72 y.o. female   with CAD s/p CABG in 2016,ischemiccardiomyopathywith improvement in EF to normals/pCABG, persistentatrial fibrillation w/CHA2DS2 VASc score of 5on Xarelto, s/p LAA clipping,HTN, CKD IIIand questionable medicationcompliance whopresentedto the ED on 01/22/19with complaints ofdyspneaand palpitations, found to be in atrial fibrillation w/ RVR and acute on chronic diastolic CHF. In the last 48 hours she contracted Influenza A and has had respiratory decline.    Assessment & Plan   ATRIAL FIB WITH RVR:    Discontinued albuterol.  Now on Xopenex.  Rate is still elevated. On Eliquis.  Need to continue IV Dilt until her respiratory status is back to baseline.     ACUTE ON CHRONIC DIASTOLIC HF:   Demadex held yesterday.  She will get a lower PO dose today.   CKD III:   Creat OK at 1.6.    RESPIRATORY DISTRESS:  Influenza A.  Beta blocker reduced secondary to wheezing.  Continue IV antibiotics and bronchodilators.  Up to chair today.  We appreciate the help of our hospital colleagues.    For questions or updates, please contact [place] Please consult [email or web address] for contact info under Cardiology/STEMI.   Signed, Minus Breeding, MD  09/16/2017, 11:50 AM

## 2017-09-17 ENCOUNTER — Inpatient Hospital Stay (HOSPITAL_COMMUNITY): Payer: PPO

## 2017-09-17 DIAGNOSIS — J441 Chronic obstructive pulmonary disease with (acute) exacerbation: Secondary | ICD-10-CM

## 2017-09-17 LAB — BASIC METABOLIC PANEL
Anion gap: 10 (ref 5–15)
BUN: 18 mg/dL (ref 6–20)
CO2: 19 mmol/L — ABNORMAL LOW (ref 22–32)
Calcium: 9.3 mg/dL (ref 8.9–10.3)
Chloride: 109 mmol/L (ref 101–111)
Creatinine, Ser: 1.21 mg/dL — ABNORMAL HIGH (ref 0.44–1.00)
GFR calc Af Amer: 51 mL/min — ABNORMAL LOW (ref >60)
GFR calc non Af Amer: 44 mL/min — ABNORMAL LOW (ref >60)
Glucose, Bld: 169 mg/dL — ABNORMAL HIGH (ref 65–99)
Potassium: 5.6 mmol/L — ABNORMAL HIGH (ref 3.5–5.1)
Sodium: 138 mmol/L (ref 135–145)

## 2017-09-17 LAB — BLOOD GAS, ARTERIAL
Acid-base deficit: 1.3 mmol/L (ref 0.0–2.0)
Bicarbonate: 22.7 mmol/L (ref 20.0–28.0)
Drawn by: 28338
O2 Content: 2 L/min
O2 Saturation: 96.4 %
Patient temperature: 98.6
pCO2 arterial: 36.6 mmHg (ref 32.0–48.0)
pH, Arterial: 7.408 (ref 7.350–7.450)
pO2, Arterial: 92.2 mmHg (ref 83.0–108.0)

## 2017-09-17 MED ORDER — DOXYCYCLINE HYCLATE 100 MG PO TABS
100.0000 mg | ORAL_TABLET | Freq: Two times a day (BID) | ORAL | Status: AC
  Administered 2017-09-17 (×2): 100 mg via ORAL
  Filled 2017-09-17 (×2): qty 1

## 2017-09-17 MED ORDER — FUROSEMIDE 10 MG/ML IJ SOLN
40.0000 mg | Freq: Once | INTRAMUSCULAR | Status: AC
  Administered 2017-09-17: 40 mg via INTRAVENOUS
  Filled 2017-09-17: qty 4

## 2017-09-17 MED ORDER — METOPROLOL TARTRATE 50 MG PO TABS
50.0000 mg | ORAL_TABLET | Freq: Three times a day (TID) | ORAL | Status: DC
  Administered 2017-09-17 – 2017-09-20 (×10): 50 mg via ORAL
  Filled 2017-09-17 (×10): qty 1

## 2017-09-17 MED ORDER — FUROSEMIDE 10 MG/ML IJ SOLN
INTRAMUSCULAR | Status: AC
  Filled 2017-09-17: qty 4

## 2017-09-17 MED ORDER — RIVAROXABAN 20 MG PO TABS
20.0000 mg | ORAL_TABLET | Freq: Every day | ORAL | Status: DC
  Administered 2017-09-17 – 2017-09-23 (×7): 20 mg via ORAL
  Filled 2017-09-17 (×7): qty 1

## 2017-09-17 MED ORDER — PREDNISONE 20 MG PO TABS
40.0000 mg | ORAL_TABLET | Freq: Every day | ORAL | Status: DC
  Administered 2017-09-17 – 2017-09-19 (×3): 40 mg via ORAL
  Filled 2017-09-17 (×3): qty 2

## 2017-09-17 NOTE — Progress Notes (Signed)
Progress Note  Patient Name: [name] [name] Date of Encounter: 09/17/2017  Primary Cardiologist: Dr. Debara Pickett  Subjective   Patient states she's feeling OK but still visibly SOB. Her pill box was found by bedside and she says around 3-4 AM she felt nauseated so took one of her pills for nausea. HR rates elevated 120-130s this AM. Thinks it's 1980.   Inpatient Medications    Scheduled Meds: . arformoterol  15 mcg Nebulization BID  . atorvastatin  10 mg Oral q1800  . budesonide (PULMICORT) nebulizer solution  0.25 mg Nebulization BID  . carvedilol  12.5 mg Oral BID WC  . doxycycline  100 mg Oral Q12H  . famotidine  20 mg Oral Daily  . febuxostat  40 mg Oral Daily  . levalbuterol  0.63 mg Nebulization Q6H   And  . ipratropium  0.5 mg Nebulization Q6H  . magnesium oxide  400 mg Oral TID  . oseltamivir  30 mg Oral BID  . predniSONE  40 mg Oral Q breakfast  . rivaroxaban  15 mg Oral Q supper  . sodium chloride flush  3 mL Intravenous Q12H  . torsemide  20 mg Oral Daily   Continuous Infusions: . sodium chloride    . diltiazem (CARDIZEM) infusion 5 mg/hr (09/15/17 1430)  . sodium chloride     PRN Meds: sodium chloride, acetaminophen, alum & mag hydroxide-simeth, haloperidol lactate, levalbuterol, sodium chloride flush   Vital Signs    Vitals:   09/16/17 2041 09/16/17 2124 09/17/17 0518 09/17/17 0746  BP:    138/89  Pulse: (!) 119 (!) 105    Resp: (!) 24 (!) 22 (!) 30   Temp: 97.7 F (36.5 C)     TempSrc: Oral     SpO2:  98%    Weight:   210 lb 15.7 oz (95.7 kg)   Height:        Intake/Output Summary (Last 24 hours) at 09/17/2017 0814 Last data filed at 09/16/2017 1706 Gross per 24 hour  Intake 710 ml  Output -  Net 710 ml   Filed Weights   09/15/17 0258 09/16/17 0443 09/17/17 0518  Weight: 197 lb 12.8 oz (89.7 kg) 202 lb 4.8 oz (91.8 kg) 210 lb 15.7 oz (95.7 kg)    Telemetry    Atrial fib rates 110-120s - Personally Reviewed  Physical Exam   GEN: No acute  distress, obese AAF HEENT: Normocephalic, atraumatic, sclera non-icteric. Neck: No JVD or bruits. Cardiac: Irregularly irregular, tachycardic,. no murmurs, rubs, or gallops.  Radials/DP/PT 1+ and equal bilaterally.  Respiratory: Coarse BS throughout with faint end exp wheezing. Breathing is mildly labored - improved after she placed oxygen back on GI: Soft, nontender, non-distended, BS +x 4. MS: no deformity. Extremities: No clubbing or cyanosis. No edema. Distal pedal pulses are 2+ and equal bilaterally. Neuro:  A+O to self, place, not time. Follows commands. Psych:  Responds to questions appropriately with a normal affect.  Labs    Chemistry Recent Labs  Lab 09/15/17 0156 09/16/17 0142 09/17/17 0444  NA 141 142 138  K 4.1 4.4 5.6*  CL 105 108 109  CO2 24 24 19*  GLUCOSE 118* 120* 169*  BUN 24* 20 18  CREATININE 1.78* 1.60* 1.21*  CALCIUM 8.4* 8.6* 9.3  GFRNONAA 27* 31* 44*  GFRAA 32* 36* 51*  ANIONGAP 12 10 10      Hematology Recent Labs  Lab 09/14/17 0202 09/15/17 0156  WBC 5.2 5.4  RBC 5.51* 5.06  HGB  15.4* 14.0  HCT 47.7* 44.0  MCV 86.6 87.0  MCH 27.9 27.7  MCHC 32.3 31.8  RDW 16.4* 16.7*  PLT 116* 93*    Cardiac EnzymesNo results for input(s): TROPONINI in the last 168 hours. No results for input(s): TROPIPOC in the last 168 hours.   BNP Recent Labs  Lab 09/16/17 0142  BNP 318.5*     DDimer No results for input(s): DDIMER in the last 168 hours.   Radiology    No results found.  Cardiac Studies    Limited echo 12/2016 Study Conclusions - Left ventricle: Posterior lateral hypokinesis. The cavity size was normal. Systolic function was normal. The estimated ejection fraction was in the range of 50% to 55%. Left ventricular diastolic function parameters were normal. - Aortic valve: There was mild regurgitation. - Mitral valve: There was mild regurgitation. - Left atrium: The atrium was moderately dilated. - Atrial septum: No defect or  patent foramen ovale was identified.    Patient Profile     72 y.o. female with CAD s/p CABG in 2016,ischemiccardiomyopathywith improvement in EF to normals/pCABG, persistentatrial fibrillation w/CHA2DS2 VASc score of 5on Xarelto, s/p LAA clipping,HTN, CKD IIIand questionable medicationcompliance whopresentedto the ED on 01/22/19with complaints ofdyspneaand palpitations, found to be in atrial fibrillation w/ RVR and acute on chronic diastolic CHF. Initially diuresed with clinical improvement but borderline HR control. On 1/31 developed paranoia and influenza A with hypoxia.   Assessment & Plan    1. Persistent atrial fib with RVR - historically difficult to control rates. On 1/30 was due to go home on diltiazem 360mg  and Coreg 25mg  BID but noted to have hypoxia with ambulation so DC held. On 1/31, new fever + flu A. Rates continue to be elevated but she is now on less meds now than before, scaled back due to hypotension/wheezing. BP improved but wheezing persists. Will d/c carvedilol and change to metoprolol 50mg  q8hours. Continue diltiazem drip with an eye towards transitioning back to oral soon. Wrote care order to uptitrate if HR still >110 1 hour after metoprolol. Will also ask pharmacy to follow along for Xarelto dosing given variable Cr this admission.  1. Hypoxia with ambulation (not previously on home O2), also with new development of influenza A (?possible AECOPD from longstanding tobacco)- appreciate IM assistance.  3. Acute on chronic diastolic CHF - diuretic scaled back due to hypotension and uptrending Cr. Weight now up to 210 again. Will review with Dr. Percival Spanish. Continue torsemide 20mg  BID for now.  4. CKD III now with hyperkalemia - Cr stable/improving. Potassium is elevated at 5.6. I suspect this is due to continued potassium supplementation with recent adjustment of diuretic. Will hold KCl for now, reassess tomorrow.  5. Minimal troponin elevation with history  of CAD s/p CABG - not felt related to ACS this admission.  6. Thrombocytopenia - mildly decreased platelets appear chronic but this is trending down by last check. IM plans to recheck in AM.  Dispo: pt advised NOT to take any home medication while inpatient as this can affect our course of treatment. Nursing helping to keep this out of reach. She verbalized understanding but based on interactions in the hospital I highly suspect she has underlying dementia and am not convinced she is safe to go home by herself. She still thinks it's 1980s this AM and was trying to take home meds overnight. Once closer to DC will need social work involvement to discuss plan for home as she lives alone.  For questions or  updates, please contact [place] Please consult [email or web address] for contact info under Cardiology/STEMI.  Signed, Charlie Pitter, PA-C 09/17/2017, 8:14 AM    History and all data above reviewed.  She is intermittently confused.   She does not seem to be improving from a pulmonary standpoint.   Patient examined.  I agree with the findings as above.  The patient exam reveals [id number]  ,  Lungs: Decreased breath sounds with wheezing  ,  Abd: Positive bowel sounds, no rebound no guarding, Ext Mild edema  .  All available labs, radiology testing, previous records reviewed. Agree with documented assessment and plan. Respiratory:  I spoke with primary care.  She started steroids yesterday.  Otherwise she is on maximal pulmonary therapy.  I will give one dose of IV Lasix today.  I stopped the Demadex PO and we will need to determine and order her diuretic dose in the AM.  Confusion:  Need to determine with the family whether there is some baseline confusion.  Exam is nonfocal.    Minus Breeding  12:11 PM  09/17/2017

## 2017-09-17 NOTE — Progress Notes (Signed)
After AM dose of metoprolol heart rate 85-90. Will continue Diltiazem at current rate of 27ml/hr. Will monitor patient. Gyselle Matthew, Bettina Gavia RN

## 2017-09-17 NOTE — Progress Notes (Addendum)
[facility] consult progress note                                                                               Patient Demographics  [name] [name], is a 72 y.o. female, DOB - Aug 21, 1945, YHC:[id number]  Admit date - 09/05/2017   Admitting Physician [name], MD  Outpatient Primary MD for the patient is [name], MD  Outpatient specialists:   LOS - 12  days   Medical records reviewed and are as summarized below:    Chief Complaint  Patient presents with  . Atrial Fibrillation       Brief summary   [name] [name] is an 72 y.o. female is a 72yo female admitted to cardiology on 1/22 due to atrial fibrillation with RVR. Pt treated with Cardizem and has had rate control.  Pt was decided to not be a candidate for ablation.  Pt was scheduled to be discharged on 1/30 with home health and follow up in A fib clinic.  Pt was noted to have develop a fever of 101.4, spiked to 103 F with coughing, subsequently developed confusion and paranoia.  [facility] was consulted for the above.  Assessment & Plan    Atrial fibrillation with RVR (HCC) -Management per cardiology, primary service, rates in the 120s thisr morning while at rest on telemetry  -Placed on scheduled Xopenex, Atrovent every 6 hours and Xopenex as needed  IRS/fevers likely due to influenza A wheezing / Underlying COPD with exacerbation -Patient met sepsis criteria due to fever, tachycardia and tachypnea. -Patient has positive influenza A PCR, started on Tamiflu on 2/1, day #3 today -no need for broad spectrum antibiotics, d/c Vanc/Zosyn -not formally diagnosed with COPD but highly suspicious given wheezing yesterday and long standing history of tobacco use -no wheezing today, change steroids to prednisone -Doxycycline for COPD exacerbation for 1 more day to complete 5 days total of antibiotics  -Continue scheduled Xopenex, Atrovent nebs every 6 hours and Xopenex as needed, Pulmicort,  Brovana, flutter valve..  -On room air this morning, able to walk on room air per nursing notes yesterday, however clinically she looks short of breath.  Repeat chest x-ray today -On discharge, she will need Xopenex inhaler and Spiriva for home, prednisone taper. Advised PFTs as an outpatient once acute episode resolves  Confusion/paranoia/acute metabolic encephalopathy -At this time alert and oriented to person/place/time, follow closely   Recent acute gout -Patient was treated outpatient for acute gout, currently not complaining of any pain in her right foot. -Continue uloric  Chronic thrombocytopenia -no bleeding, monitor   Ischemic cardiomyopathy, acute on chronic diastolic CHF, CAD status post CABG x2, HTN -Per cardiology   Code Status: full  DVT Prophylaxis: On Xarelto Family Communication: no family at bedside   Procedures:  None   Antimicrobials:   IV vancomycin 1/31> 2/1  IV Zosyn 1/31> 2/3  Tamiflu 2/1 day # 3 today   Doxy 2/3 >> 2/4   Medications  Scheduled Meds: . arformoterol  15 mcg Nebulization BID  . atorvastatin  10 mg Oral q1800  . budesonide (PULMICORT) nebulizer solution  0.25 mg Nebulization  BID  . carvedilol  12.5 mg Oral BID WC  . famotidine  20 mg Oral Daily  . febuxostat  40 mg Oral Daily  . levalbuterol  0.63 mg Nebulization Q6H   And  . ipratropium  0.5 mg Nebulization Q6H  . magnesium oxide  400 mg Oral TID  . oseltamivir  30 mg Oral BID  . potassium chloride SA  40 mEq Oral BID  . predniSONE  40 mg Oral Q breakfast  . rivaroxaban  15 mg Oral Q supper  . sodium chloride flush  3 mL Intravenous Q12H  . torsemide  20 mg Oral Daily   Continuous Infusions: . sodium chloride    . diltiazem (CARDIZEM) infusion 5 mg/hr (09/15/17 1430)  . piperacillin-tazobactam (ZOSYN)  IV 3.375 g (09/17/17 0315)  . sodium chloride     PRN Meds:.sodium chloride, acetaminophen, alum & mag hydroxide-simeth, haloperidol lactate, levalbuterol, sodium  chloride flush   Subjective:   [name] [name] was seen and examined today.   - no chest pain, shortness of breath, no abdominal pain, nausea or vomiting.    Objective:   Vitals:   09/16/17 1434 09/16/17 2041 09/16/17 2124 09/17/17 0518  BP:      Pulse:  (!) 119 (!) 105   Resp:  (!) 24 (!) 22 (!) 30  Temp:  97.7 F (36.5 C)    TempSrc:  Oral    SpO2: 95%  98%   Weight:    95.7 kg (210 lb 15.7 oz)  Height:        Intake/Output Summary (Last 24 hours) at 09/17/2017 0733 Last data filed at 09/16/2017 1706 Gross per 24 hour  Intake 710 ml  Output -  Net 710 ml     Wt Readings from Last 3 Encounters:  09/17/17 95.7 kg (210 lb 15.7 oz)  08/16/17 101.1 kg (222 lb 12.8 oz)  08/06/17 99.2 kg (218 lb 9.6 oz)     Exam   General: NAD  Eyes: no scleral icterus   Cardiovascular: irregular, no MRG, no edema   Respiratory: CTA biL, no wheezing, overall decreased breath sounds  Gastrointestinal: soft, NT, ND, BS +  Ext: no edema   Neuro: non focal, ambulatory   Musculoskeletal: normal muscle tone  Skin: no rashes  Psych: AxOx3   Data Reviewed:  I have personally reviewed following labs and imaging studies  Micro Results Recent Results (from the past 240 hour(s))  Culture, blood (routine x 2)     Status: None (Preliminary result)   Collection Time: 09/14/17  2:08 AM  Result Value Ref Range Status   Specimen Description BLOOD LEFT HAND  Final   Special Requests   Final    BOTTLES DRAWN AEROBIC AND ANAEROBIC Blood Culture adequate volume   Culture   Final    NO GROWTH 2 DAYS Performed at [facility], [address], [place], [place] [address]    Report Status PENDING  Incomplete  Culture, blood (routine x 2)     Status: None (Preliminary result)   Collection Time: 09/14/17  2:16 AM  Result Value Ref Range Status   Specimen Description BLOOD RIGHT HAND  Final   Special Requests IN PEDIATRIC BOTTLE Blood Culture adequate volume  Final   Culture   Final     NO GROWTH 2 DAYS Performed at [facility], [place] [address], [place], [place] [address]    Report Status PENDING  Incomplete  Respiratory Panel by PCR  Status: Abnormal   Collection Time: 09/14/17  8:10 AM  Result Value Ref Range Status   Adenovirus NOT DETECTED NOT DETECTED Final   Coronavirus 229E NOT DETECTED NOT DETECTED Final   Coronavirus HKU1 NOT DETECTED NOT DETECTED Final   Coronavirus NL63 NOT DETECTED NOT DETECTED Final   Coronavirus OC43 NOT DETECTED NOT DETECTED Final   Metapneumovirus NOT DETECTED NOT DETECTED Final   Rhinovirus / Enterovirus NOT DETECTED NOT DETECTED Final   Influenza A H3 DETECTED (A) NOT DETECTED Final   Influenza B NOT DETECTED NOT DETECTED Final   Parainfluenza Virus 1 NOT DETECTED NOT DETECTED Final   Parainfluenza Virus 2 NOT DETECTED NOT DETECTED Final   Parainfluenza Virus 3 NOT DETECTED NOT DETECTED Final   Parainfluenza Virus 4 NOT DETECTED NOT DETECTED Final   Respiratory Syncytial Virus NOT DETECTED NOT DETECTED Final   Bordetella pertussis NOT DETECTED NOT DETECTED Final   Chlamydophila pneumoniae NOT DETECTED NOT DETECTED Final   Mycoplasma pneumoniae NOT DETECTED NOT DETECTED Final  Urine culture     Status: None   Collection Time: 09/14/17  9:02 AM  Result Value Ref Range Status   Specimen Description URINE, CLEAN CATCH  Final   Special Requests NONE  Final   Culture   Final    NO GROWTH Performed at [facility], [address], [place], [place] [address]    Report Status 09/16/2017 FINAL  Final    Radiology Reports Dg Chest 2 View  Result Date: 09/14/2017 CLINICAL DATA:  Fever, cough, and lethargy. EXAM: CHEST  2 VIEW COMPARISON:  09/05/2017 FINDINGS: The patient is mildly rotated to the right. The cardiac silhouette remains enlarged. Sequelae of CABG and left atrial appendage clipping are again identified. Pulmonary vascular congestion and diffuse interstitial lung opacities have improved. Mild residual diffuse  interstitial prominence is at least partly chronic. No focal airspace consolidation is identified. There is mild scarring in the left mid to lower lung. No sizable pleural effusion or pneumothorax is identified. No acute osseous abnormality is seen. IMPRESSION: Cardiomegaly with improved pulmonary vascular congestion and interstitial edema. Electronically Signed   By: Logan Bores M.D.   On: 09/14/2017 09:14   Dg Chest Portable 1 View  Result Date: 09/05/2017 CLINICAL DATA:  Short of breath for 3 days EXAM: PORTABLE CHEST 1 VIEW COMPARISON:  08/04/2017 FINDINGS: Sternotomy wires overlie stable cardiac silhouette. Bibasilar atelectasis. Mild central venous congestion. Mild peripheral interstitial edema. No focal consolidation. No pneumothorax. IMPRESSION: 1. Mild increase in interstitial edema pattern. 2. Mild bibasilar atelectasis. Electronically Signed   By: Suzy Bouchard M.D.   On: 09/05/2017 09:09    Lab Data:  CBC: Recent Labs  Lab 09/14/17 0202 09/15/17 0156  WBC 5.2 5.4  HGB 15.4* 14.0  HCT 47.7* 44.0  MCV 86.6 87.0  PLT 116* 93*   Basic Metabolic Panel: Recent Labs  Lab 09/13/17 0230 09/14/17 0202 09/15/17 0156 09/16/17 0142 09/17/17 0444  NA 141 141 141 142 138  K 4.1 4.1 4.1 4.4 5.6*  CL 105 104 105 108 109  CO2 _0 19*  GLUCOSE 114* 120* 118* 120* 169*  BUN 17 18 24* 20 18  CREATININE 1.30* 1.44* 1.78* 1.60* 1.21*  CALCIUM 9.4 9.4 8.4* 8.6* 9.3   GFR: Estimated Creatinine Clearance: 49.9 mL/min (A) (by C-G formula based on SCr of 1.21 mg/dL (H)). Liver Function Tests: No results for input(s): AST, ALT, ALKPHOS, BILITOT, PROT, ALBUMIN in the last 168 hours. No results  for input(s): LIPASE, AMYLASE in the last 168 hours. No results for input(s): AMMONIA in the last 168 hours. Coagulation Profile: No results for input(s): INR, PROTIME in the last 168 hours. Cardiac Enzymes: No results for input(s): CKTOTAL, CKMB, CKMBINDEX, TROPONINI in the last 168  hours. BNP (last 3 results) No results for input(s): PROBNP in the last 8760 hours. HbA1C: No results for input(s): HGBA1C in the last 72 hours. CBG: No results for input(s): GLUCAP in the last 168 hours. Lipid Profile: No results for input(s): CHOL, HDL, LDLCALC, TRIG, CHOLHDL, LDLDIRECT in the last 72 hours. Thyroid Function Tests: No results for input(s): TSH, T4TOTAL, FREET4, T3FREE, THYROIDAB in the last 72 hours. Anemia Panel: No results for input(s): VITAMINB12, FOLATE, FERRITIN, TIBC, IRON, RETICCTPCT in the last 72 hours. Urine analysis:    Component Value Date/Time   COLORURINE YELLOW 09/14/2017 0336   APPEARANCEUR CLEAR 09/14/2017 0336   LABSPEC 1.010 09/14/2017 0336   PHURINE 6.0 09/14/2017 0336   GLUCOSEU NEGATIVE 09/14/2017 0336   HGBUR NEGATIVE 09/14/2017 0336   BILIRUBINUR NEGATIVE 09/14/2017 0336   BILIRUBINUR ng 12/25/2013 1342   KETONESUR NEGATIVE 09/14/2017 0336   PROTEINUR NEGATIVE 09/14/2017 0336   UROBILINOGEN 0.2 04/11/2017 1402   NITRITE NEGATIVE 09/14/2017 0336   LEUKOCYTESUR NEGATIVE 09/14/2017 0336   Harveen Flesch M. Cruzita Lederer, MD [facility] [phone]  If 7PM-7AM, please contact night-coverage [email or web address] Password [email or web address]

## 2017-09-17 NOTE — Progress Notes (Signed)
PHARMACY NOTE:  Anticoagulant Dose Adjustment for Renal Function  Current anticoagulant dosage:  Xarelto 15 mg PO daily with meals  Indication: Nonvalvular atrial fibrillation.  Renal Function:  Estimated Creatinine Clearance: 49.9 mL/min (A) (by C-G formula based on SCr of 1.21 mg/dL (H)).  Additional comments:  Pharmacy consulted to adjust Xarelto dosing for variable renal function. Will monitor and adjust as necessary.   Nida Boatman, PharmD PGY1 Acute Care Pharmacy Resident Pager: [phone] 09/17/2017 8:47 AM

## 2017-09-17 NOTE — Progress Notes (Addendum)
Patient becoming increasingly confused this AM, Home medications taken out of reach and patient aware they were placed in closet,  advised again not to take this.  However,confusion it does wax an wane, she will state appropriate answers to questions and then in the next sentence she will state she is seeing knives and razor blades behind the chair or people in room. Patient currently in chair with chair alarm. And reminded to call prior to getting up. Tele sitter in room will continue to monitor. Dr. Cruzita Lederer made aware  Keenan Trefry, Bettina Gavia RN

## 2017-09-17 NOTE — Progress Notes (Addendum)
Patient pill box found at bed side this am. Pt stated took some medication for nausea this early AM. Reinforced with patient that hospital staff would give her her medication and that it was very important for her to allow [place] to do that and to not take own medication from home. PA Dayna Dunn in the room also at this time and aware. Explained that medication should be picked up and taken home. Patient stated that she did not have anyone to take them home and is not adamant that medication is to stay with her at this time and not allowing for them to be placed in pharmacy. Will continue to closely monitor patient. [name] [name], Bettina Gavia RN

## 2017-09-18 LAB — CBC
HCT: 46.1 % — ABNORMAL HIGH (ref 36.0–46.0)
Hemoglobin: 14.8 g/dL (ref 12.0–15.0)
MCH: 27.9 pg (ref 26.0–34.0)
MCHC: 32.1 g/dL (ref 30.0–36.0)
MCV: 86.8 fL (ref 78.0–100.0)
Platelets: 89 10*3/uL — ABNORMAL LOW (ref 150–400)
RBC: 5.31 MIL/uL — ABNORMAL HIGH (ref 3.87–5.11)
RDW: 16.6 % — ABNORMAL HIGH (ref 11.5–15.5)
WBC: 11.8 10*3/uL — ABNORMAL HIGH (ref 4.0–10.5)

## 2017-09-18 LAB — BASIC METABOLIC PANEL
Anion gap: 11 (ref 5–15)
BUN: 22 mg/dL — ABNORMAL HIGH (ref 6–20)
CO2: 22 mmol/L (ref 22–32)
Calcium: 9.4 mg/dL (ref 8.9–10.3)
Chloride: 107 mmol/L (ref 101–111)
Creatinine, Ser: 1.23 mg/dL — ABNORMAL HIGH (ref 0.44–1.00)
GFR calc Af Amer: 50 mL/min — ABNORMAL LOW (ref >60)
GFR calc non Af Amer: 43 mL/min — ABNORMAL LOW (ref >60)
Glucose, Bld: 152 mg/dL — ABNORMAL HIGH (ref 65–99)
Potassium: 3.9 mmol/L (ref 3.5–5.1)
Sodium: 140 mmol/L (ref 135–145)

## 2017-09-18 MED ORDER — DILTIAZEM HCL 60 MG PO TABS
60.0000 mg | ORAL_TABLET | Freq: Four times a day (QID) | ORAL | Status: DC
  Administered 2017-09-18 – 2017-09-20 (×8): 60 mg via ORAL
  Filled 2017-09-18 (×8): qty 1

## 2017-09-18 NOTE — Progress Notes (Signed)
CONSULT PROGRESS NOTE  [name] [name] QAS:[id number] DOB: 05-07-1946 DOA: 09/05/2017 PCP: [name], MD   LOS: 13 days   Brief Narrative / Interim history: [name] [name] an 72 y.o.femaleis a 72yo female admitted to cardiology on 1/22 due to atrial fibrillation with RVR. Pt treated with Cardizem and has had rate control. Pt was decided to not be a candidate for ablation. Pt was scheduled to be discharged on 1/30 with home health and follow up in A fib clinic. Pt was noted to have develop a fever of 101.4, spiked to 103 F with coughing, subsequently developed confusion and paranoia.  [facility] was consulted for the above.  Assessment & Plan: Principal Problem:   Atrial fibrillation with RVR (HCC) Active Problems:   Hypokalemia   Chronic diastolic CHF (congestive heart failure) (HCC)   Essential hypertension   S/P CABG x 2   CKD (chronic kidney disease), stage III (HCC)   Cardiomyopathy, ischemic   Acute on chronic diastolic heart failure (HCC)   Medication noncompliance due to cognitive impairment   Acute drug-induced gout of right foot   Thrombocytopenia (HCC)   Atrial fibrillation with RVR ([place]) -Management per cardiology, rates still somewhat elevated into the low 100s  IRS/fevers likely due to influenza A wheezing / Underlying COPD with exacerbation -Patient met sepsis criteria due to fever, tachycardia and tachypnea. -Patient has positive influenza A PCR, started on Tamiflu on 2/1, day #4 today -no need for broad spectrum antibiotics, d/c Vanc/Zosyn -not formally diagnosed with COPD but highly suspicious given wheezing and long standing history of tobacco use.  Goal oxygen saturation 88% and above -Wheezing has resolved, changed from IV steroids to prednisone -Completed 5 days of antibiotics with vancomycin and Zosyn for 4 days and 1 last day of doxycycline -Chest x-ray yesterday without infiltrates but did show vascular congestion, status post  Lasix -Continue scheduled Xopenex, Atrovent nebs every 6 hours and Xopenex as needed, Pulmicort, Brovana, flutter valve..  -On discharge, she will need Xopenex inhaler and Spiriva for home, prednisone taper. Advised PFTs as an outpatient once acute episode resolves  Chronic kidney disease stage III -Creatinine appears at baseline, stayed stable with IV Lasix yesterday  Confusion/paranoia/acute metabolic encephalopathy -Intermittent confusion, suspect underlying cognitive issues, she answers orientation questions well for me but mental status is waxing and waning  Recent acute gout -Patient was treated outpatient for acute gout, currently not complaining of any pain in her right foot. -Continue uloric  Chronic thrombocytopenia -no bleeding, monitor   Ischemic cardiomyopathy, acute on chronic diastolic CHF, CAD status post CABG x2, HTN -Per cardiology    DVT prophylaxis: Xarelto Code Status: Full code Family Communication: no family at bedside Disposition Plan: home when ready   Procedures:   None   Antimicrobials:  Vancomycin 1/31 >> 2/1  Zosyn 1/31 >> 2/3  Tamiflu 2/1 >> plan for 2/5  Doxycycline 2/3 >> 2/4   Subjective: -Wants to go home.  Denies any shortness of breath.  She has no chest pain or palpitations.  Objective: Vitals:   09/17/17 1958 09/18/17 0055 09/18/17 0500 09/18/17 0758  BP:   (!) 144/102 (!) 149/82  Pulse: 96 91 72 (!) 102  Resp: (!) 22 18  (!) 24  Temp:   (!) 97.5 F (36.4 C)   TempSrc:   Oral Oral  SpO2:  99% 100% 94%  Weight:   110.2 kg (242 lb 15.2 oz)   Height:        Intake/Output Summary (  Last 24 hours) at 09/18/2017 1027 Last data filed at 09/18/2017 0836 Gross per 24 hour  Intake 564 ml  Output 700 ml  Net -136 ml   Filed Weights   09/16/17 0443 09/17/17 0518 09/18/17 0500  Weight: 91.8 kg (202 lb 4.8 oz) 95.7 kg (210 lb 15.7 oz) 110.2 kg (242 lb 15.2 oz)    Examination:  Constitutional: NAD Eyes:  lids and  conjunctivae normal ENMT: Mucous membranes are moist.  Neck: normal, supple Respiratory: clear to auscultation bilaterally, no wheezing, no crackles. Normal respiratory effort.  Overall decreased breath sounds Cardiovascular: Irregular, no edema  Abdomen: no tenderness. Bowel sounds positive.  Neurologic: CN 2-12 grossly intact. Strength 5/5 in all 4.  Psychiatric: Alert to place, time and situation   Data Reviewed: I have independently reviewed following labs and imaging studies   CBC: Recent Labs  Lab 09/14/17 0202 09/15/17 0156 09/18/17 0431  WBC 5.2 5.4 11.8*  HGB 15.4* 14.0 14.8  HCT 47.7* 44.0 46.1*  MCV 86.6 87.0 86.8  PLT 116* 93* 89*   Basic Metabolic Panel: Recent Labs  Lab 09/14/17 0202 09/15/17 0156 09/16/17 0142 09/17/17 0444 09/18/17 0431  NA 141 141 142 138 140  K 4.1 4.1 4.4 5.6* 3.9  CL 104 105 108 109 107  CO2 _0 19* 22  GLUCOSE 120* 118* 120* 169* 152*  BUN 18 24* 20 18 22*  CREATININE 1.44* 1.78* 1.60* 1.21* 1.23*  CALCIUM 9.4 8.4* 8.6* 9.3 9.4   GFR: Estimated Creatinine Clearance: 52.9 mL/min (A) (by C-G formula based on SCr of 1.23 mg/dL (H)). Liver Function Tests: No results for input(s): AST, ALT, ALKPHOS, BILITOT, PROT, ALBUMIN in the last 168 hours. No results for input(s): LIPASE, AMYLASE in the last 168 hours. No results for input(s): AMMONIA in the last 168 hours. Coagulation Profile: No results for input(s): INR, PROTIME in the last 168 hours. Cardiac Enzymes: No results for input(s): CKTOTAL, CKMB, CKMBINDEX, TROPONINI in the last 168 hours. BNP (last 3 results) No results for input(s): PROBNP in the last 8760 hours. HbA1C: No results for input(s): HGBA1C in the last 72 hours. CBG: No results for input(s): GLUCAP in the last 168 hours. Lipid Profile: No results for input(s): CHOL, HDL, LDLCALC, TRIG, CHOLHDL, LDLDIRECT in the last 72 hours. Thyroid Function Tests: No results for input(s): TSH, T4TOTAL, FREET4, T3FREE,  THYROIDAB in the last 72 hours. Anemia Panel: No results for input(s): VITAMINB12, FOLATE, FERRITIN, TIBC, IRON, RETICCTPCT in the last 72 hours. Urine analysis:    Component Value Date/Time   COLORURINE YELLOW 09/14/2017 0336   APPEARANCEUR CLEAR 09/14/2017 0336   LABSPEC 1.010 09/14/2017 0336   PHURINE 6.0 09/14/2017 0336   GLUCOSEU NEGATIVE 09/14/2017 0336   HGBUR NEGATIVE 09/14/2017 0336   BILIRUBINUR NEGATIVE 09/14/2017 0336   BILIRUBINUR ng 12/25/2013 1342   KETONESUR NEGATIVE 09/14/2017 0336   PROTEINUR NEGATIVE 09/14/2017 0336   UROBILINOGEN 0.2 04/11/2017 1402   NITRITE NEGATIVE 09/14/2017 0336   LEUKOCYTESUR NEGATIVE 09/14/2017 0336   Sepsis Labs: Invalid input(s): PROCALCITONIN, LACTICIDVEN  Recent Results (from the past 240 hour(s))  Culture, blood (routine x 2)     Status: None (Preliminary result)   Collection Time: 09/14/17  2:08 AM  Result Value Ref Range Status   Specimen Description BLOOD LEFT HAND  Final   Special Requests   Final    BOTTLES DRAWN AEROBIC AND ANAEROBIC Blood Culture adequate volume   Culture   Final    NO GROWTH 3  DAYS Performed at [facility], [place] [address], [place], [place] [address]    Report Status PENDING  Incomplete  Culture, blood (routine x 2)     Status: None (Preliminary result)   Collection Time: 09/14/17  2:16 AM  Result Value Ref Range Status   Specimen Description BLOOD RIGHT HAND  Final   Special Requests IN PEDIATRIC BOTTLE Blood Culture adequate volume  Final   Culture   Final    NO GROWTH 3 DAYS Performed at [facility],  [address] [place], [place] [address]    Report Status PENDING  Incomplete  Respiratory Panel by PCR     Status: Abnormal   Collection Time: 09/14/17  8:10 AM  Result Value Ref Range Status   Adenovirus NOT DETECTED NOT DETECTED Final   Coronavirus 229E NOT DETECTED NOT DETECTED Final   Coronavirus HKU1 NOT DETECTED NOT DETECTED Final   Coronavirus NL63 NOT DETECTED NOT  DETECTED Final   Coronavirus OC43 NOT DETECTED NOT DETECTED Final   Metapneumovirus NOT DETECTED NOT DETECTED Final   Rhinovirus / Enterovirus NOT DETECTED NOT DETECTED Final   Influenza A H3 DETECTED (A) NOT DETECTED Final   Influenza B NOT DETECTED NOT DETECTED Final   Parainfluenza Virus 1 NOT DETECTED NOT DETECTED Final   Parainfluenza Virus 2 NOT DETECTED NOT DETECTED Final   Parainfluenza Virus 3 NOT DETECTED NOT DETECTED Final   Parainfluenza Virus 4 NOT DETECTED NOT DETECTED Final   Respiratory Syncytial Virus NOT DETECTED NOT DETECTED Final   Bordetella pertussis NOT DETECTED NOT DETECTED Final   Chlamydophila pneumoniae NOT DETECTED NOT DETECTED Final   Mycoplasma pneumoniae NOT DETECTED NOT DETECTED Final  Urine culture     Status: None   Collection Time: 09/14/17  9:02 AM  Result Value Ref Range Status   Specimen Description URINE, CLEAN CATCH  Final   Special Requests NONE  Final   Culture   Final    NO GROWTH Performed at [facility], [address] [place], [place] [address]    Report Status 09/16/2017 FINAL  Final      Radiology Studies: Dg Chest Port 1 View  Result Date: 09/17/2017 CLINICAL DATA:  Dyspnea. EXAM: PORTABLE CHEST 1 VIEW COMPARISON:  09/14/2017 FINDINGS: Stable postsurgical changes from CABG. Stable appearance of left atrial appendage clamp. Enlarged cardiac silhouette. Calcific atherosclerotic disease and tortuosity of the aorta. Mild pulmonary vascular congestion. Stable left mid lung field scarring versus atelectasis. Osseous structures are without acute abnormality. Soft tissues are grossly normal. IMPRESSION: Persistently enlarged cardiac silhouette may represent cardiomyopathy versus pericardial effusion. Mild pulmonary vascular congestion. No evidence of pneumothorax. Electronically Signed   By: Fidela Salisbury M.D.   On: 09/17/2017 11:19     Scheduled Meds: . arformoterol  15 mcg Nebulization BID  . atorvastatin  10 mg Oral q1800   . budesonide (PULMICORT) nebulizer solution  0.25 mg Nebulization BID  . diltiazem  60 mg Oral Q6H  . famotidine  20 mg Oral Daily  . febuxostat  40 mg Oral Daily  . levalbuterol  0.63 mg Nebulization Q6H   And  . ipratropium  0.5 mg Nebulization Q6H  . magnesium oxide  400 mg Oral TID  . metoprolol tartrate  50 mg Oral Q8H  . oseltamivir  30 mg Oral BID  . predniSONE  40 mg Oral Q breakfast  . rivaroxaban  20 mg Oral Q supper  . sodium chloride flush  3 mL Intravenous Q12H  Continuous Infusions: . sodium chloride    . sodium chloride      Marzetta Board, MD, PhD [facility] Pager [phone]  If 7PM-7AM, please contact night-coverage [email or web address] Password [email or web address] 09/18/2017, 10:27 AM

## 2017-09-18 NOTE — Progress Notes (Signed)
Progress Note  Patient Name: [name] [name] Date of Encounter: 09/18/2017  Primary Cardiologist: Dr. Debara Pickett  Subjective   Does not want to have nebulizer Rx Primary issues appear to be respiratory status And dementia/deleruim   Inpatient Medications    Scheduled Meds: . arformoterol  15 mcg Nebulization BID  . atorvastatin  10 mg Oral q1800  . budesonide (PULMICORT) nebulizer solution  0.25 mg Nebulization BID  . famotidine  20 mg Oral Daily  . febuxostat  40 mg Oral Daily  . levalbuterol  0.63 mg Nebulization Q6H   And  . ipratropium  0.5 mg Nebulization Q6H  . magnesium oxide  400 mg Oral TID  . metoprolol tartrate  50 mg Oral Q8H  . oseltamivir  30 mg Oral BID  . predniSONE  40 mg Oral Q breakfast  . rivaroxaban  20 mg Oral Q supper  . sodium chloride flush  3 mL Intravenous Q12H   Continuous Infusions: . sodium chloride    . diltiazem (CARDIZEM) infusion 10 mg/hr (09/17/17 1730)  . sodium chloride     PRN Meds: sodium chloride, acetaminophen, alum & mag hydroxide-simeth, haloperidol lactate, levalbuterol, sodium chloride flush   Vital Signs    Vitals:   09/17/17 1958 09/18/17 0055 09/18/17 0500 09/18/17 0758  BP:   (!) 144/102 (!) 149/82  Pulse: 96 91 72 (!) 102  Resp: (!) 22 18  (!) 24  Temp:   (!) 97.5 F (36.4 C)   TempSrc:   Oral Oral  SpO2:  99% 100% 94%  Weight:   242 lb 15.2 oz (110.2 kg)   Height:        Intake/Output Summary (Last 24 hours) at 09/18/2017 0941 Last data filed at 09/18/2017 3875 Gross per 24 hour  Intake 564 ml  Output 700 ml  Net -136 ml   Filed Weights   09/16/17 0443 09/17/17 0518 09/18/17 0500  Weight: 202 lb 4.8 oz (91.8 kg) 210 lb 15.7 oz (95.7 kg) 242 lb 15.2 oz (110.2 kg)    Telemetry    Atrial fib rates 110-120s - Personally Reviewed  Physical Exam   Affect appropriate Chronically ill black female Tachypnic  HEENT: normal Neck supple with no adenopathy JVP normal no bruits no thyromegaly Lungs basilar  crackles exp wheezing and good diaphragmatic motion Heart:  S1/S2 no murmur, no rub, gallop or click PMI normal Abdomen: benighn, BS positve, no tenderness, no AAA no bruit.  No HSM or HJR Distal pulses intact with no bruits No edema Neuro non-focal Skin warm and dry No muscular weakness   Labs    Chemistry Recent Labs  Lab 09/16/17 0142 09/17/17 0444 09/18/17 0431  NA 142 138 140  K 4.4 5.6* 3.9  CL 108 109 107  CO2 24 19* 22  GLUCOSE 120* 169* 152*  BUN 20 18 22*  CREATININE 1.60* 1.21* 1.23*  CALCIUM 8.6* 9.3 9.4  GFRNONAA 31* 44* 43*  GFRAA 36* 51* 50*  ANIONGAP 10 10 11      Hematology Recent Labs  Lab 09/14/17 0202 09/15/17 0156  WBC 5.2 5.4  RBC 5.51* 5.06  HGB 15.4* 14.0  HCT 47.7* 44.0  MCV 86.6 87.0  MCH 27.9 27.7  MCHC 32.3 31.8  RDW 16.4* 16.7*  PLT 116* 93*    Cardiac EnzymesNo results for input(s): TROPONINI in the last 168 hours. No results for input(s): TROPIPOC in the last 168 hours.   BNP Recent Labs  Lab 09/16/17 0142  BNP 318.5*  DDimer No results for input(s): DDIMER in the last 168 hours.   Radiology    Dg Chest Port 1 View  Result Date: 09/17/2017 CLINICAL DATA:  Dyspnea. EXAM: PORTABLE CHEST 1 VIEW COMPARISON:  09/14/2017 FINDINGS: Stable postsurgical changes from CABG. Stable appearance of left atrial appendage clamp. Enlarged cardiac silhouette. Calcific atherosclerotic disease and tortuosity of the aorta. Mild pulmonary vascular congestion. Stable left mid lung field scarring versus atelectasis. Osseous structures are without acute abnormality. Soft tissues are grossly normal. IMPRESSION: Persistently enlarged cardiac silhouette may represent cardiomyopathy versus pericardial effusion. Mild pulmonary vascular congestion. No evidence of pneumothorax. Electronically Signed   By: Fidela Salisbury M.D.   On: 09/17/2017 11:19    Cardiac Studies    Limited echo 12/2016 Study Conclusions - Left ventricle: Posterior  lateral hypokinesis. The cavity size was normal. Systolic function was normal. The estimated ejection fraction was in the range of 50% to 55%. Left ventricular diastolic function parameters were normal. - Aortic valve: There was mild regurgitation. - Mitral valve: There was mild regurgitation. - Left atrium: The atrium was moderately dilated. - Atrial septum: No defect or patent foramen ovale was identified.    Patient Profile     72 y.o. female with CAD s/p CABG in 2016,ischemiccardiomyopathywith improvement in EF to normals/pCABG, persistentatrial fibrillation w/CHA2DS2 VASc score of 5on Xarelto, s/p LAA clipping,HTN, CKD IIIand questionable medicationcompliance whopresentedto the ED on 01/22/19with complaints ofdyspneaand palpitations, found to be in atrial fibrillation w/ RVR and acute on chronic diastolic CHF. Initially diuresed with clinical improvement but borderline HR control. On 1/31 developed paranoia and influenza A with hypoxia.   Assessment & Plan    1. Persistent atrial fib with RVR - rates harder to control due to acute medical issues. Was on coreg at home now Lopressor q8 and cardizem q 6 Afib is chronic on xarelto   1. Hypoxia with ambulation (not previously on home O2), also with new development of influenza A (?possible AECOPD from longstanding tobacco)- appreciate IM assistance.CXR yesterday with only mild vascular congestion  3. Acute on chronic diastolic CHF - continue diuretic less volume overloaded   4. CKD III now with hyperkalemia -  K 3.9 Cr 1.23 improved   5. Minimal troponin elevation with history of CAD s/p CABG - not felt related to ACS this admission.  6. Thrombocytopenia - mildly decreased platelets appear chronic but this is trending down by last check. IM plans to recheck in AM.  Primary issues appear to be respiratory status and dementia/delerium  No further cardiac plans other than simplifying Dosing of beta blocker and  cardizem prior to d/c   [employer]

## 2017-09-18 NOTE — Progress Notes (Signed)
RT attempted to give patient nebulizer treatments. Patient refused. RT told patient she looks like she could benefit from her treatments. Patient looks SOB. Patient still refused stating "those things are what made me this way and I don't want them." RT stated to call if patient changed her mind. RT will continue to monitor.

## 2017-09-18 NOTE — Progress Notes (Signed)
Patient complaining of sore throat and asking for medication to relieve the pain. Notified provider.

## 2017-09-18 NOTE — Progress Notes (Signed)
Pt adamantly refuse all breathing treatment medication.  Pt is admitted on this admission w/ atrial arrhythmias specifically to A-fib w/ RVR. On assessment patient is sitting on the edge of the bed, in tripod position unable to talk in complete sentence with an audible expiratory wheeze that is noted per RRT. Heart Rate is in the 120's at this time. Once introduce myself and stated I am from [facility] patient immediately refuse schedule neb treatments. Pt states "that she has never met me and that she not taking anything from no strangers". Patient also stated "those meds is what got me sick"  Positive reinforcement was used per RRT to explain to patient that I am here to help you, and that this medication will help with your breathing status. Explain to patient these medications will benefit you. I tried to encourage her on multiple attempts to take the medication due to her [place]. RN aware.  Nurse Tech at the bedside for "sitter" evaluation for patient throughout the night.   Kilyn Maragh L. Jennette Kettle, RRT, RCP

## 2017-09-19 ENCOUNTER — Inpatient Hospital Stay (HOSPITAL_COMMUNITY): Payer: PPO

## 2017-09-19 DIAGNOSIS — J111 Influenza due to unidentified influenza virus with other respiratory manifestations: Secondary | ICD-10-CM

## 2017-09-19 DIAGNOSIS — J101 Influenza due to other identified influenza virus with other respiratory manifestations: Secondary | ICD-10-CM

## 2017-09-19 LAB — BLOOD GAS, ARTERIAL
Acid-Base Excess: 1.4 mmol/L (ref 0.0–2.0)
Bicarbonate: 25.1 mmol/L (ref 20.0–28.0)
Drawn by: 358491
O2 Content: 2 L/min
O2 Saturation: 86.2 %
Patient temperature: 97.4
pCO2 arterial: 36.1 mmHg (ref 32.0–48.0)
pH, Arterial: 7.453 — ABNORMAL HIGH (ref 7.350–7.450)
pO2, Arterial: 53.6 mmHg — ABNORMAL LOW (ref 83.0–108.0)

## 2017-09-19 LAB — CULTURE, BLOOD (ROUTINE X 2)
Culture: NO GROWTH
Culture: NO GROWTH
Special Requests: ADEQUATE
Special Requests: ADEQUATE

## 2017-09-19 MED ORDER — DILTIAZEM HCL-DEXTROSE 100-5 MG/100ML-% IV SOLN (PREMIX)
5.0000 mg/h | INTRAVENOUS | Status: DC
  Administered 2017-09-19: 5 mg/h via INTRAVENOUS
  Administered 2017-09-20: 10 mg/h via INTRAVENOUS
  Filled 2017-09-19 (×2): qty 100

## 2017-09-19 MED ORDER — BUDESONIDE 0.5 MG/2ML IN SUSP
0.5000 mg | Freq: Two times a day (BID) | RESPIRATORY_TRACT | Status: DC
  Administered 2017-09-19 – 2017-09-24 (×8): 0.5 mg via RESPIRATORY_TRACT
  Filled 2017-09-19 (×11): qty 2

## 2017-09-19 MED ORDER — PANTOPRAZOLE SODIUM 40 MG PO TBEC
40.0000 mg | DELAYED_RELEASE_TABLET | Freq: Every day | ORAL | Status: DC
  Administered 2017-09-19 – 2017-09-24 (×6): 40 mg via ORAL
  Filled 2017-09-19 (×6): qty 1

## 2017-09-19 MED ORDER — FUROSEMIDE 40 MG PO TABS
60.0000 mg | ORAL_TABLET | Freq: Two times a day (BID) | ORAL | Status: DC
  Administered 2017-09-19 – 2017-09-24 (×11): 60 mg via ORAL
  Filled 2017-09-19 (×11): qty 1

## 2017-09-19 MED ORDER — FAMOTIDINE 20 MG PO TABS
40.0000 mg | ORAL_TABLET | Freq: Every day | ORAL | Status: DC
  Administered 2017-09-21 – 2017-09-23 (×3): 40 mg via ORAL
  Filled 2017-09-19 (×4): qty 2

## 2017-09-19 NOTE — Progress Notes (Signed)
Pt refusing most treatment this PM. Pt in and out of labored breathing/wheezing, mostly exacerbated by speaking and when agitated. Refused both scheduled and PRN breathing treatments, states "those meds are what got me sick in the first place". Pt offered these tx's multiple times. Also initially refused all evening medication, though finally convinced to take them, pt very paranoid this evening and states that "we are trying to kill her". Pt satting 90-95% on room air, but desats to 87-88% when wheezing. Also refusing to keep her tele box on, and pulling O2 probe off. PRN haldol given with some improvement, but still pulling at monitoring equipment. Currently, pt is asleep and sitter is at bedside. Will continue to monitor closely.

## 2017-09-19 NOTE — Progress Notes (Signed)
[facility]                                                                              Patient Demographics  [name] [name], is a 72 y.o. female, DOB - [birth year], GQQ:[id number]  Admit date - 09/05/2017   Admitting Physician [name], MD  Outpatient Primary MD for the patient is [name], MD  Outpatient specialists:   LOS - 14  days   Medical records reviewed and are as summarized below:    Chief Complaint  Patient presents with  . Atrial Fibrillation       Brief summary   [name] [name] an 72 y.o.femaleis a 72yo female admitted to cardiology on 1/22 due to atrial fibrillation with RVR. Pt treated with Cardizem and has had rate control. Pt was decided to not be a candidate for ablation. Pt was scheduled to be discharged on 1/30 with home health and follow up in A fib clinic. Pt was noted to have develop a fever of 101.4, spiked to 103 F with coughing, subsequently developed confusion and paranoia. [facility] was consulted for the above. Given predominant pulmonary issues at this point, [facility] will assume care today 2/5    Assessment & Plan    Principal Problem:   Atrial fibrillation with RVR ([place]) -Management per cardiology, rate still elevated however patient is wheezing and dyspneic which is also contributing to the RVR -Continue Xarelto  Active Problems: Influenza A, acute hypoxic respiratory failure likely secondary to underlying undiagnosed COPD with exacerbation - Patient met sepsis criteria due to fever, tachycardia and tachypnea. -Patient has positive influenza A PCR, started on Tamiflu on 2/1, day #5 today.  Vancomycin and Zosyn discontinued. -Highly likely patient has undiagnosed COPD due to long-standing history of tobacco use and wheezing, hypoxia. -This morning noticed to be somnolent, confused, audibly wheezing, refusing nebs in the last 24 hours - Stat ABG  showed mild hypoxia but no hypercarbia.  Requested  RT for stat nebs treatment. -Continue scheduled Xopenex, Atrovent, Xopenex PRN, increased Pulmicort 0.5 mg BID, Brovana, flutter valve -Discussed with Dr. [place] Bells for pulmonology recommendations, currently maxed out on management.  -On discharge, she will need Xopenex inhaler and Spiriva for home, prednisone taper.  -Will needs pulmonary outpatient appointment and PFTs once acute episode resolves  Mild acute on chronic kidney disease stage III -Currently on Lasix, creatinine is improving and stable, appears to be baseline  Acute metabolic encephalopathy/paranoia in the setting of dementia and acute hypoxic respiratory failure -ABGs did not show hypercarbia, intermittent confusion, suspecting also has some degree of dementia.    Recent acute gout - Currently stable, no acute issues, continue uloric   Chronic thrombocytopenia -No bleeding, monitor counts, slowly has been trending down  Ischemic cardiomyopathy, acute on chronic diastolic CHF, coronary artery disease status post CABG x2, hypertension -BP currently stable, continue diltiazem, metoprolol, Lasix -Cardiology following   Code Status: Full CODE STATUS DVT Prophylaxis: Xarelto Family Communication: Discussed in detail with the patient, all imaging results, lab results explained to the patient   Disposition Plan: Assumed care on 2/5, not medically ready  Time Spent in minutes  35 minutes  Procedures:    Consultants:    Antimicrobials:   Tamiflu day #5 today   Medications  Scheduled Meds: . arformoterol  15 mcg Nebulization BID  . atorvastatin  10 mg Oral q1800  . budesonide (PULMICORT) nebulizer solution  0.5 mg Nebulization BID  . diltiazem  60 mg Oral Q6H  . famotidine  20 mg Oral Daily  . febuxostat  40 mg Oral Daily  . furosemide  60 mg Oral BID  . levalbuterol  0.63 mg Nebulization Q6H   And  . ipratropium  0.5 mg Nebulization Q6H  . magnesium oxide  400 mg Oral TID  . metoprolol tartrate  50 mg Oral  Q8H  . oseltamivir  30 mg Oral BID  . predniSONE  40 mg Oral Q breakfast  . rivaroxaban  20 mg Oral Q supper  . sodium chloride flush  3 mL Intravenous Q12H   Continuous Infusions: . sodium chloride    . sodium chloride     PRN Meds:.sodium chloride, acetaminophen, alum & mag hydroxide-simeth, haloperidol lactate, levalbuterol, sodium chloride flush   Antibiotics   Anti-infectives (From admission, onward)   Start     Dose/Rate Route Frequency Ordered Stop   09/17/17 1000  doxycycline (VIBRA-TABS) tablet 100 mg     100 mg Oral Every 12 hours 09/17/17 0736 09/17/17 2037   09/15/17 0800  vancomycin (VANCOCIN) 1,250 mg in sodium chloride 0.9 % 250 mL IVPB  Status:  Discontinued     1,250 mg 166.7 mL/hr over 90 Minutes Intravenous Every 24 hours 09/14/17 1027 09/15/17 0913   09/15/17 0630  oseltamivir (TAMIFLU) capsule 30 mg     30 mg Oral 2 times daily 09/15/17 0618 09/20/17 0959   09/14/17 1800  piperacillin-tazobactam (ZOSYN) IVPB 3.375 g  Status:  Discontinued     3.375 g 12.5 mL/hr over 240 Minutes Intravenous Every 8 hours 09/14/17 1027 09/17/17 0734   09/14/17 1030  vancomycin (VANCOCIN) 1,500 mg in sodium chloride 0.9 % 500 mL IVPB     1,500 mg 250 mL/hr over 120 Minutes Intravenous  Once 09/14/17 1027 09/14/17 1442   09/14/17 1015  vancomycin (VANCOCIN) IVPB 1000 mg/200 mL premix  Status:  Discontinued     1,000 mg 200 mL/hr over 60 Minutes Intravenous  Once 09/14/17 0935 09/14/17 1730   09/14/17 0945  piperacillin-tazobactam (ZOSYN) IVPB 3.375 g     3.375 g 100 mL/hr over 30 Minutes Intravenous  Once 09/14/17 0935 09/14/17 1143        Subjective:   Rhonna Holster was seen and examined today.  Somewhat confused, oriented to herself, mumbling, overnight refusing nebulizer treatments.  No fevers or chills, denies any abdominal pain.  No nausea vomiting.  Audibly wheezing  Objective:   Vitals:   09/19/17 0903 09/19/17 0904 09/19/17 0906 09/19/17 0924  BP:    (!) 130/96    Pulse:    (!) 131  Resp:    20  Temp:    (!) 97.5 F (36.4 C)  TempSrc:    Oral  SpO2: 96% 99% 100% 99%  Weight:      Height:        Intake/Output Summary (Last 24 hours) at 09/19/2017 1013 Last data filed at 09/19/2017 0847 Gross per 24 hour  Intake 360 ml  Output -  Net 360 ml     Wt Readings from Last 3 Encounters:  09/18/17 110.2 kg (242 lb 15.2 oz)  08/16/17 101.1 kg (222 lb 12.8 oz)  08/06/17 99.2 kg (218 lb 9.6 oz)     Exam  General: Alert and oriented x self, somnolent, audibly wheezing  Eyes:   HEENT:  Atraumatic, normocephalic  Cardiovascular: Irregularly irregular, tachycardia   Respiratory: Diffuse bilateral expiratory wheezing, audible  Gastrointestinal: Soft, nontender, nondistended, + bowel sounds  Ext: no pedal edema bilaterally  Neuro: no new deficits although difficult to assess  Musculoskeletal: No digital cyanosis, clubbing  Skin: No rashes  Psych:, somewhat confused, somnolent   Data Reviewed:  I have personally reviewed following labs and imaging studies  Micro Results Recent Results (from the past 240 hour(s))  Culture, blood (routine x 2)     Status: None (Preliminary result)   Collection Time: 09/14/17  2:08 AM  Result Value Ref Range Status   Specimen Description BLOOD LEFT HAND  Final   Special Requests   Final    BOTTLES DRAWN AEROBIC AND ANAEROBIC Blood Culture adequate volume   Culture   Final    NO GROWTH 4 DAYS Performed at [facility], [address], [place], [place] [address]    Report Status PENDING  Incomplete  Culture, blood (routine x 2)     Status: None (Preliminary result)   Collection Time: 09/14/17  2:16 AM  Result Value Ref Range Status   Specimen Description BLOOD RIGHT HAND  Final   Special Requests IN PEDIATRIC BOTTLE Blood Culture adequate volume  Final   Culture   Final    NO GROWTH 4 DAYS Performed at [facility], [place] [address], [place], [place] [address]    Report Status PENDING   Incomplete  Respiratory Panel by PCR     Status: Abnormal   Collection Time: 09/14/17  8:10 AM  Result Value Ref Range Status   Adenovirus NOT DETECTED NOT DETECTED Final   Coronavirus 229E NOT DETECTED NOT DETECTED Final   Coronavirus HKU1 NOT DETECTED NOT DETECTED Final   Coronavirus NL63 NOT DETECTED NOT DETECTED Final   Coronavirus OC43 NOT DETECTED NOT DETECTED Final   Metapneumovirus NOT DETECTED NOT DETECTED Final   Rhinovirus / Enterovirus NOT DETECTED NOT DETECTED Final   Influenza A H3 DETECTED (A) NOT DETECTED Final   Influenza B NOT DETECTED NOT DETECTED Final   Parainfluenza Virus 1 NOT DETECTED NOT DETECTED Final   Parainfluenza Virus 2 NOT DETECTED NOT DETECTED Final   Parainfluenza Virus 3 NOT DETECTED NOT DETECTED Final   Parainfluenza Virus 4 NOT DETECTED NOT DETECTED Final   Respiratory Syncytial Virus NOT DETECTED NOT DETECTED Final   Bordetella pertussis NOT DETECTED NOT DETECTED Final   Chlamydophila pneumoniae NOT DETECTED NOT DETECTED Final   Mycoplasma pneumoniae NOT DETECTED NOT DETECTED Final  Urine culture     Status: None   Collection Time: 09/14/17  9:02 AM  Result Value Ref Range Status   Specimen Description URINE, CLEAN CATCH  Final   Special Requests NONE  Final   Culture   Final    NO GROWTH Performed at [facility], [address], [place], [place] [address]    Report Status 09/16/2017 FINAL  Final    Radiology Reports Dg Chest 2 View  Result Date: 09/14/2017 CLINICAL DATA:  Fever, cough, and lethargy. EXAM: CHEST  2 VIEW COMPARISON:  09/05/2017 FINDINGS: The patient is mildly rotated to the right. The cardiac silhouette remains enlarged. Sequelae of CABG and left atrial appendage clipping are again identified. Pulmonary vascular congestion and diffuse interstitial lung opacities have improved. Mild residual  diffuse interstitial prominence is at least partly chronic. No focal airspace consolidation is identified. There is mild scarring in  the left mid to lower lung. No sizable pleural effusion or pneumothorax is identified. No acute osseous abnormality is seen. IMPRESSION: Cardiomegaly with improved pulmonary vascular congestion and interstitial edema. Electronically Signed   By: Logan Bores M.D.   On: 09/14/2017 09:14   Dg Chest Port 1 View  Result Date: 09/17/2017 CLINICAL DATA:  Dyspnea. EXAM: PORTABLE CHEST 1 VIEW COMPARISON:  09/14/2017 FINDINGS: Stable postsurgical changes from CABG. Stable appearance of left atrial appendage clamp. Enlarged cardiac silhouette. Calcific atherosclerotic disease and tortuosity of the aorta. Mild pulmonary vascular congestion. Stable left mid lung field scarring versus atelectasis. Osseous structures are without acute abnormality. Soft tissues are grossly normal. IMPRESSION: Persistently enlarged cardiac silhouette may represent cardiomyopathy versus pericardial effusion. Mild pulmonary vascular congestion. No evidence of pneumothorax. Electronically Signed   By: Fidela Salisbury M.D.   On: 09/17/2017 11:19   Dg Chest Portable 1 View  Result Date: 09/05/2017 CLINICAL DATA:  Short of breath for 3 days EXAM: PORTABLE CHEST 1 VIEW COMPARISON:  08/04/2017 FINDINGS: Sternotomy wires overlie stable cardiac silhouette. Bibasilar atelectasis. Mild central venous congestion. Mild peripheral interstitial edema. No focal consolidation. No pneumothorax. IMPRESSION: 1. Mild increase in interstitial edema pattern. 2. Mild bibasilar atelectasis. Electronically Signed   By: Suzy Bouchard M.D.   On: 09/05/2017 09:09    Lab Data:  CBC: Recent Labs  Lab 09/14/17 0202 09/15/17 0156 09/18/17 0431  WBC 5.2 5.4 11.8*  HGB 15.4* 14.0 14.8  HCT 47.7* 44.0 46.1*  MCV 86.6 87.0 86.8  PLT 116* 93* 89*   Basic Metabolic Panel: Recent Labs  Lab 09/14/17 0202 09/15/17 0156 09/16/17 0142 09/17/17 0444 09/18/17 0431  NA 141 141 142 138 140  K 4.1 4.1 4.4 5.6* 3.9  CL 104 105 108 109 107  CO2 '24 24 24 ' 19*  22  GLUCOSE 120* 118* 120* 169* 152*  BUN 18 24* 20 18 22*  CREATININE 1.44* 1.78* 1.60* 1.21* 1.23*  CALCIUM 9.4 8.4* 8.6* 9.3 9.4   GFR: Estimated Creatinine Clearance: 52.9 mL/min (A) (by C-G formula based on SCr of 1.23 mg/dL (H)). Liver Function Tests: No results for input(s): AST, ALT, ALKPHOS, BILITOT, PROT, ALBUMIN in the last 168 hours. No results for input(s): LIPASE, AMYLASE in the last 168 hours. No results for input(s): AMMONIA in the last 168 hours. Coagulation Profile: No results for input(s): INR, PROTIME in the last 168 hours. Cardiac Enzymes: No results for input(s): CKTOTAL, CKMB, CKMBINDEX, TROPONINI in the last 168 hours. BNP (last 3 results) No results for input(s): PROBNP in the last 8760 hours. HbA1C: No results for input(s): HGBA1C in the last 72 hours. CBG: No results for input(s): GLUCAP in the last 168 hours. Lipid Profile: No results for input(s): CHOL, HDL, LDLCALC, TRIG, CHOLHDL, LDLDIRECT in the last 72 hours. Thyroid Function Tests: No results for input(s): TSH, T4TOTAL, FREET4, T3FREE, THYROIDAB in the last 72 hours. Anemia Panel: No results for input(s): VITAMINB12, FOLATE, FERRITIN, TIBC, IRON, RETICCTPCT in the last 72 hours. Urine analysis:    Component Value Date/Time   COLORURINE YELLOW 09/14/2017 0336   APPEARANCEUR CLEAR 09/14/2017 0336   LABSPEC 1.010 09/14/2017 0336   PHURINE 6.0 09/14/2017 0336   GLUCOSEU NEGATIVE 09/14/2017 0336   HGBUR NEGATIVE 09/14/2017 0336   BILIRUBINUR NEGATIVE 09/14/2017 0336   BILIRUBINUR ng 12/25/2013 1342   KETONESUR NEGATIVE 09/14/2017 0336   PROTEINUR NEGATIVE  09/14/2017 0336   UROBILINOGEN 0.2 04/11/2017 1402   NITRITE NEGATIVE 09/14/2017 0336   LEUKOCYTESUR NEGATIVE 09/14/2017 0336     Jeancarlo Leffler M.D. [facility] 09/19/2017, 10:13 AM  Pager: [phone] Between 7am to 7pm - call Pager - [phone]  After 7pm go to [email or web address] - password [email or web address]  Call night coverage person covering  after 7pm

## 2017-09-19 NOTE — Progress Notes (Signed)
Pt w/ continued intermitent labored breathing. O2 sats on room air vary from 92-100%. Upon assessment, wheezes are heard throughout lungs. Pt still refuses breathing tx. Pt's daughter Olin Hauser and cousin both contacted and updated on the situation. Becky Augusta spoke w/ pt, but pt still very adamantly refusing breathing tx or any other intervention at this time. Rapid RN called to assess as well, recommended keeping an eye on O2 sats and to call back if sats begin to drop/pt's work of breathing gets worse. Will continue to monitor closely.

## 2017-09-19 NOTE — Progress Notes (Signed)
Called to assess pt d/t increased WOB.  Audible wheezing heard from door.   Pt refusing breathings, meds, EKG monitoring, assessments, etc. Pt unable to complete full sentences d/t SOB.  Pt sitting in chair at foot of bed holding head and sleeping off and on.  Pt will not let RR RN listen to lungs or give breathing treatment.  Pt will not let RR RN finish what she is saying before she interrupts RR RN with profanity or loud comments.  Pt alert X 2(self and place) but disoriented to date and situation.  Pt thinks everyone is trying to kill her.  SpO2-92-100% on RA currently (this is the ONLY monitor the patient will wear).  Advised bedside RN to monitor pts SpO2 and WOB and call RRT if it gets worse or if further assistance needed.

## 2017-09-19 NOTE — Progress Notes (Signed)
Paged Dr. Raiford Simmonds of Cardiology regarding pt's Afib RVR (110s-140s) seemingly uncontrolled by PO medications. Verbal order received to restart cardizem gtt @5mg /hr. Will administer and continue to monitor.

## 2017-09-19 NOTE — Consult Note (Signed)
Name: [name] [name] MRN: [id number] DOB: May 01, 1946    ADMISSION DATE:  09/05/2017 CONSULTATION DATE: 09/19/2017  REFERRING MD : [facility]  CHIEF COMPLAINT: Respiratory distress  BRIEF PATIENT DESCRIPTION: 72 year old female who appears in no acute distress at rest  [place]  09/05/2016 admitted with atrial fibrillation with rapid ventricular response 09/14/2016 a flu positive 09/15/2017 started on Tamiflu 09/19/2017 pulmonary consult for hypoxia and respiratory distress.   STUDIES:     HISTORY OF PRESENT ILLNESS:   72 year old female who smoked up to 2016 at which time she was in a cardiac cath and had a full cardiac arrest on the table.  Reportedly she has not smoked since but does report having post liver abnormality approximately 1 month ago.  Admitted 09/05/2017 per cardiology service with congestive heart failure atrial fib rapid ventricular response.  He improved with the cardiac interventions.  She was getting ready for discharge home when she developed a cough fever and was positive for influenza A.  She was started on Tamiflu on 09/15/2017 and changed over to the [facility] service.  09/19/2017 she had rapid ventricular response with atrial fib cardiology was called back to evaluate.  She was also noted to be wheezing and reported to be hypoxia and was having confusion and agitation. 35 2019 pulmonary critical care was called for wheezing and hypoxia.  Is also refusing her bronchodilator treatments. On evaluation she is awake alert sitting in a chair had just ambulated in the hallway with physical therapy.  Her O2 saturations are 97%.  She does have a large component of vocal cord dysfunction that clears with pursed lip breathing.  She reports she is dissatisfied with the way she gets her air and denies chest pain or other discomforts.  Conferred with cardiologist feels he is reasonably stable at this time.  He does not appear to be in acute respiratory distress.  The main concern  has been her confusion and refusing breathing treatments.  Note that she is on Brovana twice daily along with Xopenex every 6 hours as needed and Xopenex with Atrovent every 6 hours around-the-clock.  This may be a touch more long-acting beta agonist that she needs.  She is also on steroids which have been reduced to prednisone 40 mg daily by mouth. Note steroids may be causing some of her anxiety and confusion. Cardiology is going to diurese her as on exam she does appear to be volume overloaded.  Pulmonary critical care will continue to follow on a daily basis.  Chest x-ray was ordered today.  PAST MEDICAL HISTORY :   has a past medical history of Acute respiratory failure ([place]) (08/2017), Arthritis, Asthma, CAD (coronary artery disease), Cardiomyopathy, ischemic, Chronic combined systolic and diastolic heart failure ([place]), CKD (chronic kidney disease), stage III ([place]), Gout, Hypertension, Hypokalemia, LGI bleed (08/06/2017), Persistent atrial fibrillation ([place]), Personal history of noncompliance with medical treatment, presenting hazards to health, and S/P CABG x 2 with clipping of LA appendage (11/14/2014).  has a past surgical history that includes left heart catheterization with coronary angiogram (N/A, 11/04/2014); temporary pacemaker insertion (11/04/2014); Coronary artery bypass graft (N/A, 11/14/2014); Clipping of atrial appendage (N/A, 11/14/2014); TEE without cardioversion (N/A, 11/14/2014); and Cardioversion (N/A, 11/18/2014). Prior to Admission medications   Medication Sig Start Date End Date Taking? Authorizing Provider  atorvastatin (LIPITOR) 10 MG tablet Take 10 mg by mouth daily.   Yes [provider]  carvedilol (COREG) 25 MG tablet Take 1 tablet (25 mg total) by mouth 2 (two) times  daily with a meal. 08/02/17  Yes Sherran Needs, NP  fluticasone (FLONASE) 50 MCG/ACT nasal spray Place 1 spray into both nostrils daily. Patient taking differently: Place 1 spray into both nostrils daily  as needed for allergies.  07/30/17  Yes Lendon Colonel, NP  furosemide (LASIX) 40 MG tablet Take 60 mg by mouth 2 (two) times daily.  08/14/17  Yes Hilty, Nadean Corwin, MD  ketoconazole (NIZORAL) 2 % cream Apply 1 application topically 2 (two) times daily. 08/21/17  Yes [provider]  rivaroxaban (XARELTO) 20 MG TABS tablet Take 1 tablet (20 mg total) by mouth daily with supper. 07/29/17  Yes Lendon Colonel, NP  tiZANidine (ZANAFLEX) 2 MG tablet Take 2 mg by mouth daily as needed for muscle spasms. 08/25/17  Yes [provider]  acetaminophen (TYLENOL) 500 MG tablet Take 500 mg by mouth every 6 (six) hours as needed for moderate pain.     [provider]  diltiazem (CARDIZEM CD) 360 MG 24 hr capsule Take 1 capsule (360 mg total) by mouth daily. 09/13/17   Tommie Raymond, NP  febuxostat ([name]) 40 MG tablet Take 40 mg by mouth daily as needed.    [provider]  fluconazole (DIFLUCAN) 100 MG tablet Take 100 mg by mouth daily. 08/21/17   [provider]  hydrALAZINE (APRESOLINE) 25 MG tablet Take 1 tablet (25 mg total) by mouth every 8 (eight) hours. 09/13/17   Kathyrn Drown D, NP  magnesium oxide (MAG-OX) 400 (241.3 Mg) MG tablet Take 1 tablet (400 mg total) by mouth 3 (three) times daily. 09/13/17   Kathyrn Drown D, NP  potassium chloride SA (K-DUR,KLOR-CON) 20 MEQ tablet Take 2 tablets (40 mEq total) by mouth 2 (two) times daily. 07/29/17   Lendon Colonel, NP  ranitidine (ZANTAC) 150 MG capsule Take 150 mg by mouth daily as needed for heartburn.     [provider]  torsemide (DEMADEX) 20 MG tablet Take 2 tablets (40 mg total) by mouth 2 (two) times daily. 09/13/17   Tommie Raymond, NP   Allergies  Allergen Reactions  . Bee Venom Anaphylaxis  . Codeine Nausea And Vomiting  . Shrimp [Shellfish Allergy] Swelling  . Tomato     Pt states it gives her gout    FAMILY HISTORY:  family history includes Cancer in her mother; Heart  disease in her father. SOCIAL HISTORY:  reports that she has been smoking cigarettes.  She has a 5.60 pack-year smoking history. she has never used smokeless tobacco. She reports that she does not drink alcohol or use drugs.  REVIEW OF SYSTEMS:   10 point review of system taken, please see HPI for positives and negatives.  SUBJECTIVE:  Well-nourished well-developed female no acute distress at rest.  VITAL SIGNS: Temp:  [97.4 F (36.3 C)-97.9 F (36.6 C)] 97.5 F (36.4 C) (02/05 0924) Pulse Rate:  [102-131] 131 (02/05 0924) Resp:  [12-20] 20 (02/05 0924) BP: (130-150)/(89-110) 130/96 (02/05 0924) SpO2:  [92 %-100 %] 99 % (02/05 0924)  PHYSICAL EXAMINATION: General: Elderly female sitting in chair in no acute distress on 2 L nasal cannula Neuro: Appears intact at the time of examination.  Reports not being satisfied with the quality of her breathing. HEENT: No JVD lymphadenopathy is appreciated Cardiovascular: Heart sounds are regular irregular atrial fibrillation with a heart rate of 125 Lungs: Large component of vocal cord dysfunction, wheezing clears 95% with pursed lip breathing. Abdomen: Large positive bowel Musculoskeletal: Intact Skin:  2+ lower extremity edema  Recent Labs  Lab 09/16/17 0142 09/17/17 0444 09/18/17 0431  NA 142 138 140  K 4.4 5.6* 3.9  CL 108 109 107  CO2 24 19* 22  BUN 20 18 22*  CREATININE 1.60* 1.21* 1.23*  GLUCOSE 120* 169* 152*   Recent Labs  Lab 09/14/17 0202 09/15/17 0156 09/18/17 0431  HGB 15.4* 14.0 14.8  HCT 47.7* 44.0 46.1*  WBC 5.2 5.4 11.8*  PLT 116* 93* 89*   Dg Chest Port 1 View  Result Date: 09/17/2017 CLINICAL DATA:  Dyspnea. EXAM: PORTABLE CHEST 1 VIEW COMPARISON:  09/14/2017 FINDINGS: Stable postsurgical changes from CABG. Stable appearance of left atrial appendage clamp. Enlarged cardiac silhouette. Calcific atherosclerotic disease and tortuosity of the aorta. Mild pulmonary vascular congestion. Stable left mid lung field  scarring versus atelectasis. Osseous structures are without acute abnormality. Soft tissues are grossly normal. IMPRESSION: Persistently enlarged cardiac silhouette may represent cardiomyopathy versus pericardial effusion. Mild pulmonary vascular congestion. No evidence of pneumothorax. Electronically Signed   By: Fidela Salisbury M.D.   On: 09/17/2017 11:19    ASSESSMENT:     Influenza with respiratory manifestation flu a + 09/14/16 started on tamiflu 09/15/17    COPD, former smoker with continued secondhand hand smoke exposure.   Anxiety and confusion ? Secondary to steroids.   Atrial fibrillation with RVR (H[facility])   Hypokalemia   Chronic diastolic CHF (congestive heart failure) (H[facility])   Essential hypertension   S/P CABG x 2   CKD (chronic kidney disease), stage III ([facility])   Cardiomyopathy, ischemic   Acute on chronic diastolic heart failure (HCC)   Medication noncompliance due to cognitive impairment   Acute drug-induced gout of right foot   Thrombocytopenia [facility])    Discussion: 72 year old female who smoked up to 2016 at which time she was in a cardiac cath and had a full cardiac arrest on the table.  Reportedly she has not smoked since but does report having post liver abnormality approximately 1 month ago.  Admitted 09/05/2017 per cardiology service with congestive heart failure atrial fib rapid ventricular response.  He improved with the cardiac interventions.  She was getting ready for discharge home when she developed a cough fever and was positive for influenza A.  She was started on Tamiflu on 09/15/2017 and changed over to the [facility] service.  09/19/2017 she had rapid ventricular response with atrial fib cardiology was called back to evaluate.  She was also noted to be wheezing and reported to be hypoxia and was having confusion and agitation. 35 2019 pulmonary critical care was called for wheezing and hypoxia.  Is also refusing her bronchodilator treatments. On evaluation she is awake  alert sitting in a chair had just ambulated in the hallway with physical therapy.  Her O2 saturations are 97%.  She does have a large component of vocal cord dysfunction that clears with pursed lip breathing.  She reports she is dissatisfied with the way she gets her air and denies chest pain or other discomforts.  Conferred with cardiologist feels he is reasonably stable at this time.  He does not appear to be in acute respiratory distress.  The main concern has been her confusion and refusing breathing treatments.  Note that she is on Brovana twice daily along with Xopenex every 6 hours as needed and Xopenex with Atrovent every 6 hours around-the-clock.  This may be a touch more long-acting beta agonist that she needs.  She is also on steroids which have been  reduced to prednisone 40 mg daily by mouth.  Note steroids may be causing some of her anxiety and confusion. Cardiology is going to diurese her as on exam she does appear to be volume overloaded.  Pulmonary critical care will continue to follow on a daily basis.  Chest x-ray was ordered today.     PLAN: O2 as needed  Continue steroids and consider reducing dose  Ambulation as tolerated  Bronchodilators as needed consider DC Xopenex and Atrovent every 6 hours and discontinue Xopenex as needed along with Brovana.  Pursed lip breathing as instructed.  Chest x-ray 09/19/2017 for completeness.  Agree with diuresis per cardiology.  Richardson Landry Verdis Bassette ACNP [facility] PCCM Pager [phone] till 1 pm If no answer page [phone] 09/19/2017, 10:30 AM

## 2017-09-19 NOTE — Progress Notes (Signed)
Physical Therapy Treatment Patient Details Name: [name] [name] MRN: [id number] DOB: July 02, 1946 Today's Date: 09/19/2017    History of Present Illness Pt adm with afib with rvr and acute heart failure. Pt also developed painful rt foot. Pt planned to d/c on 09/13/17 but desaturated on RA with activity to 77%, kept for observation, developed a fever and tested positive for influenza A. PMH - CAD, s/p CABG, parox afib, HTN, ICM, gout    PT Comments    Pt had received a breathing treatment just prior to therapy. In sitting, pt with noted wheezing and lethargic. However, once she stood up, wheezing stopped and she became alert and followed all simple commands. She requested to ambulate within room. Ambulated 15' with RW and min A +2 for O2 tank. O2 sats remained above 97%, HR 125-140 bpm. Pt fatigued after session and wheezing began again once she sat down. PT will continue to follow as pt is able. Given recent events, changing recommendation to ST SNF.     Follow Up Recommendations  SNF     Equipment Recommendations  Rolling walker with 5" wheels    Recommendations for Other Services       Precautions / Restrictions Precautions Precautions: Fall Restrictions Weight Bearing Restrictions: No    Mobility  Bed Mobility               General bed mobility comments: pt received in chair  Transfers Overall transfer level: Needs assistance Equipment used: Rolling walker (2 wheeled) Transfers: Sit to/from Stand Sit to Stand: Min assist;+2 safety/equipment         General transfer comment: min A (+2 for equipment)  Ambulation/Gait Ambulation/Gait assistance: Min assist;+2 safety/equipment Ambulation Distance (Feet): 15 Feet Assistive device: Rolling walker (2 wheeled) Gait Pattern/deviations: Step-through pattern;Decreased stride length;Wide base of support Gait velocity: decreased Gait velocity interpretation: <1.8 ft/sec, indicative of risk for recurrent falls General Gait  Details: pt ambulated on 2L O2 with sats above 97%. Had been wheezing when sitting in chair but no wheezing when ambulating and was able to converse. Chair brought behind but was able to return all the way back to starting point before sitting. Fatigued after walking. HR 125-140 bpm a-fib.    Stairs            Wheelchair Mobility    Modified Rankin (Stroke Patients Only)       Balance Overall balance assessment: Needs assistance Sitting-balance support: Feet supported Sitting balance-Leahy Scale: Good     Standing balance support: Bilateral upper extremity supported;During functional activity Standing balance-Leahy Scale: Poor Standing balance comment: reliant on RW                            Cognition Arousal/Alertness: Lethargic Behavior During Therapy: Flat affect Overall Cognitive Status: Impaired/Different from baseline Area of Impairment: Following commands;Awareness;Problem solving                   Current Attention Level: Sustained Memory: Decreased short-term memory Following Commands: Follows one step commands consistently Safety/Judgement: Decreased awareness of safety;Decreased awareness of deficits   Problem Solving: Requires verbal cues General Comments: pt did not follow commands well when sitting in chair and lethargic but once she was up, she followed all commands and was alert.      Exercises      General Comments General comments (skin integrity, edema, etc.): of note, after session spoke to PA about her lack of wheezing furing  ambulation and he felt that she is having wheezing from larynx not lungs and is largely related to anxiety      Pertinent Vitals/Pain Pain Assessment: No/denies pain    Home Living                      Prior Function            PT Goals (current goals can now be found in the care plan section) Acute Rehab PT Goals Patient Stated Goal: get better PT Goal Formulation: With patient Time  For Goal Achievement: 10/03/17 Potential to Achieve Goals: Fair Progress towards PT goals: Progressing toward goals    Frequency    Min 2X/week      PT Plan Discharge plan needs to be updated;Frequency needs to be updated    Co-evaluation              AM-PAC PT "6 Clicks" Daily Activity  Outcome Measure  Difficulty turning over in bed (including adjusting bedclothes, sheets and blankets)?: A Little Difficulty moving from lying on back to sitting on the side of the bed? : A Little Difficulty sitting down on and standing up from a chair with arms (e.g., wheelchair, bedside commode, etc,.)?: Unable Help needed moving to and from a bed to chair (including a wheelchair)?: A Little Help needed walking in hospital room?: A Little Help needed climbing 3-5 steps with a railing? : A Lot 6 Click Score: 15    End of Session Equipment Utilized During Treatment: Gait belt;Oxygen Activity Tolerance: Patient tolerated treatment well Patient left: in chair;with call bell/phone within reach;with nursing/sitter in room Nurse Communication: Mobility status PT Visit Diagnosis: Unsteadiness on feet (R26.81);Muscle weakness (generalized) (M62.81);Difficulty in walking, not elsewhere classified (R26.2)     Time: 8828-0034 PT Time Calculation (min) (ACUTE ONLY): 14 min  Charges:  $Gait Training: 8-22 mins                    G Codes:       [facility]  [place] 09/19/2017, 10:48 AM

## 2017-09-19 NOTE — Progress Notes (Addendum)
DAILY PROGRESS NOTE   Patient Name: [name] [name] Date of Encounter: 09/19/2017  Chief Complaint   Somnolent, confused  Patient Profile   [name] [name] is a 72 y.o. female with a history of  CAD s/p CABG in 2016, Ischemic CM with improvement in EF to normal post CABG, atrial fibrillation w/ CHA2DS2 VASc score of 5, s/p LAA clipping and is on chronic OAC w/ Xarelto, HTN and questionable med compliance, presenting to the ED with CC of dyspnea and palpitations, found to be in atrial fibrillation w/ RVR and acute on chronic diastolic CHF.   Subjective   [name] [name] is reported to be somnolent and confused overnight- has been refusing breathing treatments. Unfortunately, developed influenza A pneumonia while hospitalized and was treated for this on Oseltamivir. Diuresed about 8L negative - weight today is inaccurate. Called by Dr. Tana Coast this morning who ordered a STAT ABG - I personally reviewed this, which demonstrates hypoxemia. Creatinine has been stable and improved. BNP was low at 318 on 2/2. WBC count elevated from 5.4 to 11.8 overnight. No new fevers. She was started on prednisone on 2/3 (40 mg). CXR yesterday shows cardiomegaly and pulmonary vascular congestion, but no obvious pneumonia. She is noted to be tachycardic today.  Objective   Vitals:   09/19/17 0903 09/19/17 0904 09/19/17 0906 09/19/17 0924  BP:    (!) 130/96  Pulse:    (!) 131  Resp:    20  Temp:    (!) 97.5 F (36.4 C)  TempSrc:    Oral  SpO2: 96% 99% 100% 99%  Weight:      Height:        Intake/Output Summary (Last 24 hours) at 09/19/2017 0936 Last data filed at 09/19/2017 0847 Gross per 24 hour  Intake 360 ml  Output -  Net 360 ml   Filed Weights   09/16/17 0443 09/17/17 0518 09/18/17 0500  Weight: 202 lb 4.8 oz (91.8 kg) 210 lb 15.7 oz (95.7 kg) 242 lb 15.2 oz (110.2 kg)    Physical Exam   General appearance: Awake, was walking with therapy, audible wheezes, dyspnea Neck: no carotid bruit, no JVD and  thyroid not enlarged, symmetric, no tenderness/mass/nodules Lungs: diminished breath sounds bilaterally, rales bibasilar and wheezes bilaterally Heart: irregularly irregular rhythm and tachycardic Abdomen: soft, non-tender; bowel sounds normal; no masses,  no organomegaly and obese Extremities: extremities normal, atraumatic, no cyanosis or edema Pulses: 2+ and symmetric Skin: Skin color, texture, turgor normal. No rashes or lesions Neurologic: Mental status: Drowsy, but awake, follows commands, Oriented x 3 Psych: Pleasant  Inpatient Medications    Scheduled Meds: . arformoterol  15 mcg Nebulization BID  . atorvastatin  10 mg Oral q1800  . budesonide (PULMICORT) nebulizer solution  0.5 mg Nebulization BID  . diltiazem  60 mg Oral Q6H  . famotidine  20 mg Oral Daily  . febuxostat  40 mg Oral Daily  . levalbuterol  0.63 mg Nebulization Q6H   And  . ipratropium  0.5 mg Nebulization Q6H  . magnesium oxide  400 mg Oral TID  . metoprolol tartrate  50 mg Oral Q8H  . oseltamivir  30 mg Oral BID  . predniSONE  40 mg Oral Q breakfast  . rivaroxaban  20 mg Oral Q supper  . sodium chloride flush  3 mL Intravenous Q12H    Continuous Infusions: . sodium chloride    . sodium chloride      PRN Meds: sodium chloride, acetaminophen, alum &  mag hydroxide-simeth, haloperidol lactate, levalbuterol, sodium chloride flush   Labs   Results for orders placed or performed during the hospital encounter of 09/05/17 (from the past 48 hour(s))  Blood gas, arterial     Status: None   Collection Time: 09/17/17  2:10 PM  Result Value Ref Range   O2 Content 2.0 L/min   Delivery systems NASAL CANNULA    pH, Arterial 7.408 7.350 - 7.450   pCO2 arterial 36.6 32.0 - 48.0 mmHg   pO2, Arterial 92.2 83.0 - 108.0 mmHg   Bicarbonate 22.7 20.0 - 28.0 mmol/L   Acid-base deficit 1.3 0.0 - 2.0 mmol/L   O2 Saturation 96.4 %   Patient temperature 98.6    Collection site RIGHT RADIAL    Drawn by [phone]     Sample type ARTERIAL DRAW    Allens test (pass/fail) PASS PASS  Basic metabolic panel     Status: Abnormal   Collection Time: 09/18/17  4:31 AM  Result Value Ref Range   Sodium 140 135 - 145 mmol/L   Potassium 3.9 3.5 - 5.1 mmol/L   Chloride 107 101 - 111 mmol/L   CO2 22 22 - 32 mmol/L   Glucose, Bld 152 (H) 65 - 99 mg/dL   BUN 22 (H) 6 - 20 mg/dL   Creatinine, Ser 1.23 (H) 0.44 - 1.00 mg/dL   Calcium 9.4 8.9 - 10.3 mg/dL   GFR calc non Af Amer 43 (L) >60 mL/min   GFR calc Af Amer 50 (L) >60 mL/min    Comment: (NOTE) The eGFR has been calculated using the CKD EPI equation. This calculation has not been validated in all clinical situations. eGFR's persistently <60 mL/min signify possible Chronic Kidney Disease.    Anion gap 11 5 - 15    Comment: Performed at [facility] [address] [place], [place] [address]  CBC     Status: Abnormal   Collection Time: 09/18/17  4:31 AM  Result Value Ref Range   WBC 11.8 (H) 4.0 - 10.5 K/uL   RBC 5.31 (H) 3.87 - 5.11 MIL/uL   Hemoglobin 14.8 12.0 - 15.0 g/dL   HCT 46.1 (H) 36.0 - 46.0 %   MCV 86.8 78.0 - 100.0 fL   MCH 27.9 26.0 - 34.0 pg   MCHC 32.1 30.0 - 36.0 g/dL   RDW 16.6 (H) 11.5 - 15.5 %   Platelets 89 (L) 150 - 400 K/uL    Comment: CONSISTENT WITH PREVIOUS RESULT Performed at [facility] [address], [place], [place] [address]   Blood gas, arterial     Status: Abnormal   Collection Time: 09/19/17  9:00 AM  Result Value Ref Range   O2 Content 2.0 L/min   Delivery systems NASAL CANNULA    pH, Arterial 7.453 (H) 7.350 - 7.450   pCO2 arterial 36.1 32.0 - 48.0 mmHg   pO2, Arterial 53.6 (L) 83.0 - 108.0 mmHg   Bicarbonate 25.1 20.0 - 28.0 mmol/L   Acid-Base Excess 1.4 0.0 - 2.0 mmol/L   O2 Saturation 86.2 %   Patient temperature 97.4    Collection site LEFT RADIAL    Drawn by [id number]    Sample type ARTERIAL DRAW    Allens test (pass/fail) PASS PASS    ECG   N/A  Telemetry   A-fib with RVR -  Personally Reviewed  Radiology    Dg Chest Port 1 View  Result Date: 09/17/2017 CLINICAL DATA:  Dyspnea. EXAM: PORTABLE  CHEST 1 VIEW COMPARISON:  09/14/2017 FINDINGS: Stable postsurgical changes from CABG. Stable appearance of left atrial appendage clamp. Enlarged cardiac silhouette. Calcific atherosclerotic disease and tortuosity of the aorta. Mild pulmonary vascular congestion. Stable left mid lung field scarring versus atelectasis. Osseous structures are without acute abnormality. Soft tissues are grossly normal. IMPRESSION: Persistently enlarged cardiac silhouette may represent cardiomyopathy versus pericardial effusion. Mild pulmonary vascular congestion. No evidence of pneumothorax. Electronically Signed   By: Fidela Salisbury M.D.   On: 09/17/2017 11:19    Cardiac Studies   N/A  Assessment   1. Principal Problem: 2.   Atrial fibrillation with RVR ([place]) 3. Active Problems: 4.   Hypokalemia 5.   Chronic diastolic CHF (congestive heart failure) ([place]) 6.   Essential hypertension 7.   S/P CABG x 2 8.   CKD (chronic kidney disease), stage III ([facility]) 9.   Cardiomyopathy, ischemic 10.   Acute on chronic diastolic heart failure ([place]) 11.   Medication noncompliance due to cognitive impairment 12.   Acute drug-induced gout of right foot 13.   Thrombocytopenia ([place]) 14.   Plan   1. Mrs. Dovel was somnolent this am - ABG shows hypoxia, sats in the upper 90's on oxygen. Was able to walk with therapist - oriented x 3 to me. No fevers overnight. Significant wheezes and dyspnea - Had refused breathing treatments but agreeable to it this am. Mild basilar crackles. Weight recorded may be inaccurate given significant weight gain in 1 day. Would re-weigh patient. Restart oral lasix for maintenance. A-fib with RVR related to exertion, nebs and dyspnea. Appreciate PCCM evaluation  - d/w Dr. Tana Coast and she is agreeable to assume care for primarily respiratory issues at this point.  Time Spent Directly  with Patient:  I have spent a total of 35 minutes with the patient reviewing hospital notes, telemetry, EKGs, labs and examining the patient as well as establishing an assessment and plan that was discussed personally with the patient. > 50% of time was spent in direct patient care.  Length of Stay:  LOS: 14 days   [name], MD, [facility], [place] Director of the Advanced Lipid Disorders &  Cardiovascular Risk Reduction Clinic Diplomate of the American Board of Clinical Lipidology Attending Cardiologist  Direct Dial: [phone]  Fax: [phone]  Website:  [email or web address]  09/19/2017, 9:36 AM

## 2017-09-20 DIAGNOSIS — J111 Influenza due to unidentified influenza virus with other respiratory manifestations: Secondary | ICD-10-CM

## 2017-09-20 DIAGNOSIS — I4891 Unspecified atrial fibrillation: Secondary | ICD-10-CM

## 2017-09-20 DIAGNOSIS — I509 Heart failure, unspecified: Secondary | ICD-10-CM

## 2017-09-20 MED ORDER — METOPROLOL SUCCINATE ER 50 MG PO TB24
50.0000 mg | ORAL_TABLET | Freq: Two times a day (BID) | ORAL | Status: DC
  Administered 2017-09-20 – 2017-09-24 (×8): 50 mg via ORAL
  Filled 2017-09-20 (×10): qty 1

## 2017-09-20 MED ORDER — HALOPERIDOL LACTATE 5 MG/ML IJ SOLN
2.5000 mg | Freq: Once | INTRAMUSCULAR | Status: AC
  Administered 2017-09-20: 2.5 mg via INTRAVENOUS
  Filled 2017-09-20: qty 1

## 2017-09-20 MED ORDER — DILTIAZEM HCL ER COATED BEADS 120 MG PO CP24
360.0000 mg | ORAL_CAPSULE | Freq: Every day | ORAL | Status: DC
  Administered 2017-09-20 – 2017-09-24 (×5): 360 mg via ORAL
  Filled 2017-09-20 (×7): qty 3

## 2017-09-20 NOTE — Progress Notes (Signed)
[facility]                                                                              Patient Demographics  [name] [name], is a 72 y.o. female, DOB - May 30, 1946, [id number]  Admit date - 09/05/2017   Admitting Physician [name], MD  Outpatient Primary MD for the patient is [name], MD  Outpatient specialists:   LOS - 15  days   Medical records reviewed and are as summarized below:    Chief Complaint  Patient presents with  . Atrial Fibrillation       Brief summary   [name] [name] an 72 y.o.femaleis a 72yo female admitted to cardiology on 1/22 due to atrial fibrillation with RVR. Pt treated with Cardizem and has had rate control. Pt was decided to not be a candidate for ablation. Pt was scheduled to be discharged on 1/30 with home health and follow up in A fib clinic. Pt was noted to have develop a fever of 101.4, spiked to 103 F with coughing, subsequently developed confusion and paranoia. [facility] was consulted for the above. Given predominant pulmonary issues at this point, [facility] will assume care on 2/5  Assessment & Plan    Principal Problem:   Atrial fibrillation with RVR ([place]) -Management per cardiology, HR improving, started on diltiazem by cardiology. Continue metoprolol -Continue Xarelto  Active Problems: Influenza A, acute hypoxic respiratory failure likely secondary to underlying undiagnosed COPD with exacerbation - Patient met sepsis criteria due to fever, tachycardia and tachypnea. -Patient has positive influenza A PCR, started on Tamiflu on 2/1 completed Tamiflu on 2/5. Vancomycin and Zosyn discontinued. -Highly likely patient has undiagnosed COPD due to long-standing history of tobacco use and wheezing, hypoxia and component of VCD -CCM consulted, recommended continue as needed Xopenex, PPI, Pulmicort while inpatient -Will likely need PFTs and outpatient follow-up with pulmonology after discharge.     Mild acute on chronic kidney disease stage III -Currently on Lasix,creatinine stable and improving   Acute metabolic encephalopathy/paranoia in the setting of dementia and acute hypoxic respiratory failure -ABGs did not show hypercarbia, suspecting also has some degree of dementia.    Recent acute gout - Currently stable, no acute issues, continue uloric   Chronic thrombocytopenia -No bleeding, monitor counts, slowly has been trending down  Ischemic cardiomyopathy, acute on chronic diastolic CHF, coronary artery disease status post CABG x2, hypertension -BP currently stable, continue diltiazem, metoprolol, Lasix -Cardiology following   Code Status: Full CODE STATUS DVT Prophylaxis: Xarelto Family Communication: Discussed in detail with the patient, all imaging results, lab results explained to the patient   Disposition Plan: Assumed care on 2/5, not medically ready  Time Spent in minutes   35 minutes  Procedures:    Consultants:    Antimicrobials:   Tamiflu day #5 today   Medications  Scheduled Meds: . atorvastatin  10 mg Oral q1800  . budesonide (PULMICORT) nebulizer solution  0.5 mg Nebulization BID  . diltiazem  360 mg Oral Daily  . famotidine  40 mg Oral QHS  . febuxostat  40 mg Oral Daily  . furosemide  60  mg Oral BID  . magnesium oxide  400 mg Oral TID  . metoprolol succinate  50 mg Oral BID  . pantoprazole  40 mg Oral Q1200  . rivaroxaban  20 mg Oral Q supper  . sodium chloride flush  3 mL Intravenous Q12H   Continuous Infusions: . sodium chloride    . sodium chloride     PRN Meds:.sodium chloride, acetaminophen, alum & mag hydroxide-simeth, haloperidol lactate, levalbuterol, sodium chloride flush   Antibiotics   Anti-infectives (From admission, onward)   Start     Dose/Rate Route Frequency Ordered Stop   09/17/17 1000  doxycycline (VIBRA-TABS) tablet 100 mg     100 mg Oral Every 12 hours 09/17/17 0736 09/17/17 2037   09/15/17 0800  vancomycin  (VANCOCIN) 1,250 mg in sodium chloride 0.9 % 250 mL IVPB  Status:  Discontinued     1,250 mg 166.7 mL/hr over 90 Minutes Intravenous Every 24 hours 09/14/17 1027 09/15/17 0913   09/15/17 0630  oseltamivir (TAMIFLU) capsule 30 mg     30 mg Oral 2 times daily 09/15/17 0618 09/19/17 2226   09/14/17 1800  piperacillin-tazobactam (ZOSYN) IVPB 3.375 g  Status:  Discontinued     3.375 g 12.5 mL/hr over 240 Minutes Intravenous Every 8 hours 09/14/17 1027 09/17/17 0734   09/14/17 1030  vancomycin (VANCOCIN) 1,500 mg in sodium chloride 0.9 % 500 mL IVPB     1,500 mg 250 mL/hr over 120 Minutes Intravenous  Once 09/14/17 1027 09/14/17 1442   09/14/17 1015  vancomycin (VANCOCIN) IVPB 1000 mg/200 mL premix  Status:  Discontinued     1,000 mg 200 mL/hr over 60 Minutes Intravenous  Once 09/14/17 0935 09/14/17 1730   09/14/17 0945  piperacillin-tazobactam (ZOSYN) IVPB 3.375 g     3.375 g 100 mL/hr over 30 Minutes Intravenous  Once 09/14/17 0935 09/14/17 1143        Subjective:   [name] [name] was seen and examined today.  Somewhat confused, oriented to herself, mumbling, overnight refusing nebulizer treatments.  No fevers or chills, denies any abdominal pain.  No nausea vomiting.  Audibly wheezing  Objective:   Vitals:   09/20/17 0827 09/20/17 0948 09/20/17 0949 09/20/17 1206  BP: 107/82 (!) 117/93  116/66  Pulse: (!) 106 (!) 106    Resp:  (!) 24    Temp: 98.3 F (36.8 C)   (!) 97.1 F (36.2 C)  TempSrc: Oral   Oral  SpO2:  95% 95%   Weight:      Height:        Intake/Output Summary (Last 24 hours) at 09/20/2017 1252 Last data filed at 09/20/2017 1206 Gross per 24 hour  Intake 875.92 ml  Output 851 ml  Net 24.92 ml     Wt Readings from Last 3 Encounters:  09/20/17 91.4 kg (201 lb 8 oz)  08/16/17 101.1 kg (222 lb 12.8 oz)  08/06/17 99.2 kg (218 lb 9.6 oz)     Exam   General: Alert and oriented x 3, NAD  Eyes:  HEENT:    Cardiovascular: S1 S2 auscultated, no rubs, murmurs or  gallops. Regular rate and rhythm. No pedal edema b/l  Respiratory: Mild scattered rhonchi with upper airway wheezing   Gastrointestinal: Soft, nontender, nondistended, + bowel sounds  Ext: no pedal edema bilaterally  Neuro: no new deficits  Musculoskeletal: No digital cyanosis, clubbing  Skin: No rashes  Psych: Normal affect and demeanor, alert and oriented x3    Data Reviewed:  I have  personally reviewed following labs and imaging studies  Micro Results Recent Results (from the past 240 hour(s))  Culture, blood (routine x 2)     Status: None   Collection Time: 09/14/17  2:08 AM  Result Value Ref Range Status   Specimen Description BLOOD LEFT HAND  Final   Special Requests   Final    BOTTLES DRAWN AEROBIC AND ANAEROBIC Blood Culture adequate volume   Culture   Final    NO GROWTH 5 DAYS Performed at [facility], [place] [address], [place], [place] [address]    Report Status 09/19/2017 FINAL  Final  Culture, blood (routine x 2)     Status: None   Collection Time: 09/14/17  2:16 AM  Result Value Ref Range Status   Specimen Description BLOOD RIGHT HAND  Final   Special Requests IN PEDIATRIC BOTTLE Blood Culture adequate volume  Final   Culture   Final    NO GROWTH 5 DAYS Performed at [facility], [place] [address] [place], [place] [address]    Report Status 09/19/2017 FINAL  Final  Respiratory Panel by PCR     Status: Abnormal   Collection Time: 09/14/17  8:10 AM  Result Value Ref Range Status   Adenovirus NOT DETECTED NOT DETECTED Final   Coronavirus 229E NOT DETECTED NOT DETECTED Final   Coronavirus HKU1 NOT DETECTED NOT DETECTED Final   Coronavirus NL63 NOT DETECTED NOT DETECTED Final   Coronavirus OC43 NOT DETECTED NOT DETECTED Final   Metapneumovirus NOT DETECTED NOT DETECTED Final   Rhinovirus / Enterovirus NOT DETECTED NOT DETECTED Final   Influenza A H3 DETECTED (A) NOT DETECTED Final   Influenza B NOT DETECTED NOT DETECTED Final   Parainfluenza  Virus 1 NOT DETECTED NOT DETECTED Final   Parainfluenza Virus 2 NOT DETECTED NOT DETECTED Final   Parainfluenza Virus 3 NOT DETECTED NOT DETECTED Final   Parainfluenza Virus 4 NOT DETECTED NOT DETECTED Final   Respiratory Syncytial Virus NOT DETECTED NOT DETECTED Final   Bordetella pertussis NOT DETECTED NOT DETECTED Final   Chlamydophila pneumoniae NOT DETECTED NOT DETECTED Final   Mycoplasma pneumoniae NOT DETECTED NOT DETECTED Final  Urine culture     Status: None   Collection Time: 09/14/17  9:02 AM  Result Value Ref Range Status   Specimen Description URINE, CLEAN CATCH  Final   Special Requests NONE  Final   Culture   Final    NO GROWTH Performed at [facility], [address] [place], [place] [address]    Report Status 09/16/2017 FINAL  Final    Radiology Reports Dg Chest 2 View  Result Date: 09/14/2017 CLINICAL DATA:  Fever, cough, and lethargy. EXAM: CHEST  2 VIEW COMPARISON:  09/05/2017 FINDINGS: The patient is mildly rotated to the right. The cardiac silhouette remains enlarged. Sequelae of CABG and left atrial appendage clipping are again identified. Pulmonary vascular congestion and diffuse interstitial lung opacities have improved. Mild residual diffuse interstitial prominence is at least partly chronic. No focal airspace consolidation is identified. There is mild scarring in the left mid to lower lung. No sizable pleural effusion or pneumothorax is identified. No acute osseous abnormality is seen. IMPRESSION: Cardiomegaly with improved pulmonary vascular congestion and interstitial edema. Electronically Signed   By: Logan Bores M.D.   On: 09/14/2017 09:14   Dg Chest Port 1 View  Result Date: 09/19/2017 CLINICAL DATA:  Respiratory failure, shortness of breath, wheezing EXAM: PORTABLE CHEST 1 VIEW COMPARISON:  09/17/2017 FINDINGS: Cardiomegaly. Prior CABG. Mild pulmonary vascular congestion. Lingular atelectasis. No confluent opacities otherwise. No effusions or overt  edema. No pneumothorax. No acute bony abnormality. IMPRESSION: Cardiomegaly with mild vascular congestion. Lingular atelectasis. No change. Electronically Signed   By: Rolm Baptise M.D.   On: 09/19/2017 10:19   Dg Chest Port 1 View  Result Date: 09/17/2017 CLINICAL DATA:  Dyspnea. EXAM: PORTABLE CHEST 1 VIEW COMPARISON:  09/14/2017 FINDINGS: Stable postsurgical changes from CABG. Stable appearance of left atrial appendage clamp. Enlarged cardiac silhouette. Calcific atherosclerotic disease and tortuosity of the aorta. Mild pulmonary vascular congestion. Stable left mid lung field scarring versus atelectasis. Osseous structures are without acute abnormality. Soft tissues are grossly normal. IMPRESSION: Persistently enlarged cardiac silhouette may represent cardiomyopathy versus pericardial effusion. Mild pulmonary vascular congestion. No evidence of pneumothorax. Electronically Signed   By: Fidela Salisbury M.D.   On: 09/17/2017 11:19   Dg Chest Portable 1 View  Result Date: 09/05/2017 CLINICAL DATA:  Short of breath for 3 days EXAM: PORTABLE CHEST 1 VIEW COMPARISON:  08/04/2017 FINDINGS: Sternotomy wires overlie stable cardiac silhouette. Bibasilar atelectasis. Mild central venous congestion. Mild peripheral interstitial edema. No focal consolidation. No pneumothorax. IMPRESSION: 1. Mild increase in interstitial edema pattern. 2. Mild bibasilar atelectasis. Electronically Signed   By: Suzy Bouchard M.D.   On: 09/05/2017 09:09    Lab Data:  CBC: Recent Labs  Lab 09/14/17 0202 09/15/17 0156 09/18/17 0431  WBC 5.2 5.4 11.8*  HGB 15.4* 14.0 14.8  HCT 47.7* 44.0 46.1*  MCV 86.6 87.0 86.8  PLT 116* 93* 89*   Basic Metabolic Panel: Recent Labs  Lab 09/14/17 0202 09/15/17 0156 09/16/17 0142 09/17/17 0444 09/18/17 0431  NA 141 141 142 138 140  K 4.1 4.1 4.4 5.6* 3.9  CL 104 105 108 109 107  CO2 _0 19* 22  GLUCOSE 120* 118* 120* 169* 152*  BUN 18 24* 20 18 22*  CREATININE  1.44* 1.78* 1.60* 1.21* 1.23*  CALCIUM 9.4 8.4* 8.6* 9.3 9.4   GFR: Estimated Creatinine Clearance: 48 mL/min (A) (by C-G formula based on SCr of 1.23 mg/dL (H)). Liver Function Tests: No results for input(s): AST, ALT, ALKPHOS, BILITOT, PROT, ALBUMIN in the last 168 hours. No results for input(s): LIPASE, AMYLASE in the last 168 hours. No results for input(s): AMMONIA in the last 168 hours. Coagulation Profile: No results for input(s): INR, PROTIME in the last 168 hours. Cardiac Enzymes: No results for input(s): CKTOTAL, CKMB, CKMBINDEX, TROPONINI in the last 168 hours. BNP (last 3 results) No results for input(s): PROBNP in the last 8760 hours. HbA1C: No results for input(s): HGBA1C in the last 72 hours. CBG: No results for input(s): GLUCAP in the last 168 hours. Lipid Profile: No results for input(s): CHOL, HDL, LDLCALC, TRIG, CHOLHDL, LDLDIRECT in the last 72 hours. Thyroid Function Tests: No results for input(s): TSH, T4TOTAL, FREET4, T3FREE, THYROIDAB in the last 72 hours. Anemia Panel: No results for input(s): VITAMINB12, FOLATE, FERRITIN, TIBC, IRON, RETICCTPCT in the last 72 hours. Urine analysis:    Component Value Date/Time   COLORURINE YELLOW 09/14/2017 0336   APPEARANCEUR CLEAR 09/14/2017 0336   LABSPEC 1.010 09/14/2017 0336   PHURINE 6.0 09/14/2017 0336   GLUCOSEU NEGATIVE 09/14/2017 0336   HGBUR NEGATIVE 09/14/2017 0336   BILIRUBINUR NEGATIVE 09/14/2017 0336   BILIRUBINUR ng 12/25/2013 1342   KETONESUR NEGATIVE 09/14/2017 0336   PROTEINUR NEGATIVE 09/14/2017 0336   UROBILINOGEN 0.2 04/11/2017 1402   NITRITE NEGATIVE 09/14/2017  [place] 09/14/2017 0336     Estill Cotta M.D. [facility] 09/20/2017, 12:52 PM  Pager: [phone] Between 7am to 7pm - call Pager - [phone]  After 7pm go to [email or web address] - password [email or web address]  Call night coverage person covering after 7pm

## 2017-09-20 NOTE — Progress Notes (Signed)
Patient is confused, combative, argumentive and trying to leave hospital AMA.  Patient received Haldol with no relief. Refused PO med's. Security notified. Physician notified awaiting response.

## 2017-09-20 NOTE — Progress Notes (Signed)
DAILY PROGRESS NOTE   Patient Name: [name] [name] Date of Encounter: 09/20/2017  Chief Complaint   A-fib with RVR overnight  Patient Profile   [name] [name] is a 72 y.o. female with a history of  CAD s/p CABG in 2016, Ischemic CM with improvement in EF to normal post CABG, atrial fibrillation w/ CHA2DS2 VASc score of 5, s/p LAA clipping and is on chronic OAC w/ Xarelto, HTN and questionable med compliance, presenting to the ED with CC of dyspnea and palpitations, found to be in atrial fibrillation w/ RVR and acute on chronic diastolic CHF.   Subjective   Confused overnight, but more clear this morning. Afebrile. Upper airway wheeze - seems deconditioned. Agree with PT recommendations for SNF. On diltiazem gtts at 10 mg/hr. Will switch to oral meds today.  Objective   Vitals:   09/20/17 0130 09/20/17 0259 09/20/17 0300 09/20/17 0827  BP: (!) 141/97  (!) 123/96 107/82  Pulse:    (!) 106  Resp: 13  (!) 22   Temp:  (!) 97.2 F (36.2 C)  98.3 F (36.8 C)  TempSrc:  Oral  Oral  SpO2: 100%  99%   Weight:  201 lb 8 oz (91.4 kg)    Height:        Intake/Output Summary (Last 24 hours) at 09/20/2017 0919 Last data filed at 09/20/2017 0600 Gross per 24 hour  Intake 995.92 ml  Output 250 ml  Net 745.92 ml   Filed Weights   09/17/17 0518 09/18/17 0500 09/20/17 0259  Weight: 210 lb 15.7 oz (95.7 kg) 242 lb 15.2 oz (110.2 kg) 201 lb 8 oz (91.4 kg)    Physical Exam   General appearance: Awake, was walking with therapy, audible wheezes, dyspnea Neck: no carotid bruit, no JVD and thyroid not enlarged, symmetric, no tenderness/mass/nodules Lungs: diminished breath sounds bilaterally, rales bibasilar and wheezes bilaterally Heart: irregularly irregular rhythm and tachycardic Abdomen: soft, non-tender; bowel sounds normal; no masses,  no organomegaly and obese Extremities: extremities normal, atraumatic, no cyanosis or edema Pulses: 2+ and symmetric Skin: Skin color, texture, turgor  normal. No rashes or lesions Neurologic: Mental status: Drowsy, but awake, follows commands, Oriented x 3 Psych: Pleasant  Inpatient Medications    Scheduled Meds: . atorvastatin  10 mg Oral q1800  . budesonide (PULMICORT) nebulizer solution  0.5 mg Nebulization BID  . diltiazem  60 mg Oral Q6H  . famotidine  40 mg Oral QHS  . febuxostat  40 mg Oral Daily  . furosemide  60 mg Oral BID  . magnesium oxide  400 mg Oral TID  . metoprolol tartrate  50 mg Oral Q8H  . pantoprazole  40 mg Oral Q1200  . rivaroxaban  20 mg Oral Q supper  . sodium chloride flush  3 mL Intravenous Q12H    Continuous Infusions: . sodium chloride    . diltiazem (CARDIZEM) infusion 10 mg/hr (09/20/17 0604)  . sodium chloride      PRN Meds: sodium chloride, acetaminophen, alum & mag hydroxide-simeth, haloperidol lactate, levalbuterol, sodium chloride flush   Labs   Results for orders placed or performed during the hospital encounter of 09/05/17 (from the past 48 hour(s))  Blood gas, arterial     Status: Abnormal   Collection Time: 09/19/17  9:00 AM  Result Value Ref Range   O2 Content 2.0 L/min   Delivery systems NASAL CANNULA    pH, Arterial 7.453 (H) 7.350 - 7.450   pCO2 arterial 36.1 32.0 -  48.0 mmHg   pO2, Arterial 53.6 (L) 83.0 - 108.0 mmHg   Bicarbonate 25.1 20.0 - 28.0 mmol/L   Acid-Base Excess 1.4 0.0 - 2.0 mmol/L   O2 Saturation 86.2 %   Patient temperature 97.4    Collection site LEFT RADIAL    Drawn by [id number]    Sample type ARTERIAL DRAW    Allens test (pass/fail) PASS PASS    ECG   N/A  Telemetry   A-fib with RVR - Personally Reviewed  Radiology    Dg Chest Port 1 View  Result Date: 09/19/2017 CLINICAL DATA:  Respiratory failure, shortness of breath, wheezing EXAM: PORTABLE CHEST 1 VIEW COMPARISON:  09/17/2017 FINDINGS: Cardiomegaly. Prior CABG. Mild pulmonary vascular congestion. Lingular atelectasis. No confluent opacities otherwise. No effusions or overt edema. No  pneumothorax. No acute bony abnormality. IMPRESSION: Cardiomegaly with mild vascular congestion. Lingular atelectasis. No change. Electronically Signed   By: Rolm Baptise M.D.   On: 09/19/2017 10:19    Cardiac Studies   N/A  Assessment   Principal Problem:   Atrial fibrillation with RVR (HCC) Active Problems:   Hypokalemia   Chronic diastolic CHF (congestive heart failure) (HCC)   Essential hypertension   S/P CABG x 2   CKD (chronic kidney disease), stage III (HCC)   Cardiomyopathy, ischemic   Acute on chronic diastolic heart failure (HCC)   Medication noncompliance due to cognitive impairment   Acute drug-induced gout of right foot   Thrombocytopenia ([place])   Influenza with respiratory manifestation flu a + 09/14/16 started on tamiflu 09/15/17   Plan   1. [name] [name] is oriented today - she has upper airway wheeze which is paroxysmal. In a-fib with RVR - will switch to oral meds an intensify.  Diuresed some additional overnight, but mainly even. Will need to work toward SNF placement. Called her brother for an update twice this morning without any answer or voicemail.  Time Spent Directly with Patient:  I have spent a total of 25 minutes with the patient reviewing hospital notes, telemetry, EKGs, labs and examining the patient as well as establishing an assessment and plan that was discussed personally with the patient. > 50% of time was spent in direct patient care.  Length of Stay:  LOS: 15 days   [name], MD, [facility], [place] Director of the Advanced Lipid Disorders &  Cardiovascular Risk Reduction Clinic Diplomate of the American Board of Clinical Lipidology Attending Cardiologist  Direct Dial: [phone]  Fax: [phone]  Website:  [name] 09/20/2017, 9:19 AM

## 2017-09-20 NOTE — Progress Notes (Signed)
Name: [name] [name] MRN: [id number] DOB: 02-10-1946    ADMISSION DATE:  09/05/2017 CONSULTATION DATE: 09/19/2017  REFERRING MD : [facility]  CHIEF COMPLAINT: Respiratory distress  SUMMARY:   72 year old female who smoked up to 2016 at which time she was in a cardiac cath and had a full cardiac arrest on the table.  Reportedly she has not smoked since but does report having post liver abnormality approximately 1 month ago.  Admitted 09/05/2017 per cardiology service with congestive heart failure atrial fib rapid ventricular response.  He improved with the cardiac interventions.  She was getting ready for discharge home when she developed a cough fever and was positive for influenza A.  She was started on Tamiflu on 09/15/2017 and changed over to the [facility] service.  09/19/2017 she had rapid ventricular response with atrial fib cardiology was called back to evaluate.  She was also noted to be wheezing and reported to be hypoxia and was having confusion and agitation.  10/17/2017 pulmonary critical care was called for wheezing and hypoxia.  Is also refusing her bronchodilator treatments.  On evaluation she is awake alert sitting in a chair had just ambulated in the hallway with physical therapy.  Her O2 saturations are 97%.  She does have a large component of vocal cord dysfunction that clears with pursed lip breathing.  She reports she is dissatisfied with the way she gets her air and denies chest pain or other discomforts.     SUBJECTIVE:  Pt reports she is "not feeling any worse".  No acute events overnight.    VITAL SIGNS: Temp:  [97 F (36.1 C)-98.3 F (36.8 C)] 98.3 F (36.8 C) (02/06 0827) Pulse Rate:  [106-129] 106 (02/06 0948) Resp:  [13-34] 24 (02/06 0948) BP: (107-153)/(82-103) 117/93 (02/06 0948) SpO2:  [91 %-100 %] 95 % (02/06 0949) Weight:  [201 lb 8 oz (91.4 kg)] 201 lb 8 oz (91.4 kg) (02/06 0259)  PHYSICAL EXAMINATION: General: elderly female in NAD, lying in bed   HEENT: MM  pink/moist, intermittent upper airway noise at times  PSY: calm Neuro: AAOx4, speech clear, MAE  CV: s1s2 rrr, no m/r/g PULM: even/non-labored, lungs bilaterally clear, no wheezing  BM:WUXL, non-tender, bsx4 active  Extremities: warm/dry, no edema  Skin: no rashes or lesions   Recent Labs  Lab 09/16/17 0142 09/17/17 0444 09/18/17 0431  NA 142 138 140  K 4.4 5.6* 3.9  CL 108 109 107  CO2 24 19* 22  BUN 20 18 22*  CREATININE 1.60* 1.21* 1.23*  GLUCOSE 120* 169* 152*   Recent Labs  Lab 09/14/17 0202 09/15/17 0156 09/18/17 0431  HGB 15.4* 14.0 14.8  HCT 47.7* 44.0 46.1*  WBC 5.2 5.4 11.8*  PLT 116* 93* 89*   Dg Chest Port 1 View  Result Date: 09/19/2017 CLINICAL DATA:  Respiratory failure, shortness of breath, wheezing EXAM: PORTABLE CHEST 1 VIEW COMPARISON:  09/17/2017 FINDINGS: Cardiomegaly. Prior CABG. Mild pulmonary vascular congestion. Lingular atelectasis. No confluent opacities otherwise. No effusions or overt edema. No pneumothorax. No acute bony abnormality. IMPRESSION: Cardiomegaly with mild vascular congestion. Lingular atelectasis. No change. Electronically Signed   By: Rolm Baptise M.D.   On: 09/19/2017 10:19    ASSESSMENT:     Influenza with respiratory manifestation flu a + 09/14/16 started on tamiflu 09/15/17    COPD, former smoker with continued secondhand hand smoke exposure.   Anxiety and confusion ? Secondary to steroids.   Atrial fibrillation with RVR (HCC)   Hypokalemia  Chronic diastolic CHF (congestive heart failure) (HCC)   Essential hypertension   S/P CABG x 2   CKD (chronic kidney disease), stage III (HCC)   Cardiomyopathy, ischemic   Acute on chronic diastolic heart failure (HCC)   Medication noncompliance due to cognitive impairment   Acute drug-induced gout of right foot   Thrombocytopenia [facility])    Discussion: 72 year old female known to our service from a cardiac arrest event in 2016 was admitted with A. fib with RVR and volume  overload.  After admission was found to be febrile and have influenza A.  Pulmonary and critical care medicine was consulted on September 19, 2017 for evaluation of ongoing shortness of breath and wheezing.  Wheezing felt to be upper airway in nature.     Influenza A Positive  Atrial Fibrillation  Wheezing  GERD  Possible Asthma  Suspected VCD   PLAN: Pulmonary hygiene - IS Continue Tamiflu  Supportive care for flu  PRN xopenex GERD Rx with PPI  Continue pulmicort while inpatient  Will need repeat PFT's as outpatient  Pursed lip breathing techniques  Hospital follow up arranged, see discharge section    PCCM will be available PRN.  Please call back if new needs arise.   Noe Gens, NP-C [facility] Pgr: [phone] or if no answer [phone] 09/20/2017, 11:14 AM  Attending:  I have seen and examined the patient with nurse practitioner/resident and agree with the note above.  We formulated the plan together and I elicited the following history.    Subjective: Patient has a non productive cough.  Objective: Vitals:   09/20/17 0827 09/20/17 0948 09/20/17 0949 09/20/17 1206  BP: 107/82 (!) 117/93  116/66  Pulse: (!) 106 (!) 106    Resp:  (!) 24    Temp: 98.3 F (36.8 C)   (!) 97.1 F (36.2 C)  TempSrc: Oral   Oral  SpO2:  95% 95%   Weight:      Height:          Intake/Output Summary (Last 24 hours) at 09/20/2017 1502 Last data filed at 09/20/2017 1206 Gross per 24 hour  Intake 875.92 ml  Output 851 ml  Net 24.92 ml    HEENT: no stridor. No nasal discharge. Chest: clear to auscultation. No wheezes. Cor: irregular rhythm. S1s2. No gallop. Abd: soft, non tender. Ext: warm, no cyanosis.   CBC    Component Value Date/Time   WBC 11.8 (H) 09/18/2017 0431   RBC 5.31 (H) 09/18/2017 0431   HGB 14.8 09/18/2017 0431   HCT 46.1 (H) 09/18/2017 0431   PLT 89 (L) 09/18/2017 0431   MCV 86.8 09/18/2017 0431   MCV 88.8 10/09/2014 1453   MCH 27.9  09/18/2017 0431   MCHC 32.1 09/18/2017 0431   RDW 16.6 (H) 09/18/2017 0431   LYMPHSABS 2.1 08/04/2017 1139   MONOABS 0.5 08/04/2017 1139   EOSABS 0.1 08/04/2017 1139   BASOSABS 0.0 08/04/2017 1139    BMET    Component Value Date/Time   NA 140 09/18/2017 0431   K 3.9 09/18/2017 0431   CL 107 09/18/2017 0431   CO2 22 09/18/2017 0431   GLUCOSE 152 (H) 09/18/2017 0431   BUN 22 (H) 09/18/2017 0431   CREATININE 1.23 (H) 09/18/2017 0431   CREATININE 1.40 (H) 01/26/2015 1539   CALCIUM 9.4 09/18/2017 0431   GFRNONAA 43 (L) 09/18/2017 0431   GFRAA 50 (L) 09/18/2017 0431    CXR images  DG Chest [facility]  Final Result    DG CHEST PORT 1 VIEW  Final Result    DG Chest 2 View  Final Result    DG Chest Portable 1 View  Final Result       Impression/Plan: 1. Influenza infection; supportive care. Agree with Tamiflu 2. Vocal dysfunction syndrome. Continue outpatient follow up care.   Jesus Genera, MD [place] [facility] Pager: [phone]

## 2017-09-20 NOTE — Progress Notes (Signed)
ANTICOAGULATION CONSULT NOTE - Follow Up Consult  Pharmacy Consult for Xarelto Indication: atrial fibrillation  Allergies  Allergen Reactions  . Bee Venom Anaphylaxis  . Codeine Nausea And Vomiting  . Shrimp [Shellfish Allergy] Swelling  . Tomato     Pt states it gives her gout    Patient Measurements: Height: 5\' 7"  (170.2 cm) Weight: 201 lb 8 oz (91.4 kg) IBW/kg (Calculated) : 61.6  Vital Signs: Temp: 98.3 F (36.8 C) (02/06 0827) Temp Source: Oral (02/06 0827) BP: 117/93 (02/06 0948) Pulse Rate: 106 (02/06 0948)  Labs: Recent Labs    09/18/17 0431  HGB 14.8  HCT 46.1*  PLT 89*  CREATININE 1.23*    Estimated Creatinine Clearance: 48 mL/min (A) (by C-G formula based on SCr of 1.23 mg/dL (H)).   Medications:  Medications Prior to Admission  Medication Sig Dispense Refill Last Dose  . atorvastatin (LIPITOR) 10 MG tablet Take 10 mg by mouth daily.     . carvedilol (COREG) 25 MG tablet Take 1 tablet (25 mg total) by mouth 2 (two) times daily with a meal. 60 tablet 3 09/04/2017 at 30DS  . fluticasone (FLONASE) 50 MCG/ACT nasal spray Place 1 spray into both nostrils daily. (Patient taking differently: Place 1 spray into both nostrils daily as needed for allergies. ) 16 g 2 LF 07-29-17 at 30DS  . furosemide (LASIX) 40 MG tablet Take 60 mg by mouth 2 (two) times daily.  90 tablet 3 LF 08-21-16 at 30DS  . ketoconazole (NIZORAL) 2 % cream Apply 1 application topically 2 (two) times daily.   LF 08-21-17 at 30DS  . rivaroxaban (XARELTO) 20 MG TABS tablet Take 1 tablet (20 mg total) by mouth daily with supper. 30 tablet 4 09/04/2017 at 30DS  . tiZANidine (ZANAFLEX) 2 MG tablet Take 2 mg by mouth daily as needed for muscle spasms.   LF 08-25-17 at 30DS  . acetaminophen (TYLENOL) 500 MG tablet Take 500 mg by mouth every 6 (six) hours as needed for moderate pain.    UNK  . febuxostat ([name]) 40 MG tablet Take 40 mg by mouth daily as needed.   UNK  . fluconazole (DIFLUCAN) 100 MG tablet  Take 100 mg by mouth daily.   LF 08-21-17 at 5DS  . potassium chloride SA (K-DUR,KLOR-CON) 20 MEQ tablet Take 2 tablets (40 mEq total) by mouth 2 (two) times daily. 60 tablet 4 LF 07-29-17 at 15DS  . ranitidine (ZANTAC) 150 MG capsule Take 150 mg by mouth daily as needed for heartburn.    UNK    Assessment: 27 YOF with history of AFib on Xarelto. Pharmacy managing Xarelto dose with fluctuating renal fx. SCr improved to 1.23. CrCl ~ 60 mL/min. Hgb wnl, Plt 89k   Goal of Therapy:  Stroke prevention Monitor platelets by anticoagulation protocol: Yes   Plan:  -Continue Xarelto 20 mg daily daily -Monitor s/s of bleeding   [name] [name], PharmD., BCPS Clinical Pharmacist Pager [phone]

## 2017-09-21 ENCOUNTER — Ambulatory Visit: Payer: PPO | Admitting: Adult Health

## 2017-09-21 ENCOUNTER — Inpatient Hospital Stay (HOSPITAL_COMMUNITY): Payer: PPO

## 2017-09-21 ENCOUNTER — Encounter (HOSPITAL_COMMUNITY): Payer: Self-pay | Admitting: Family Medicine

## 2017-09-21 LAB — CBC WITH DIFFERENTIAL/PLATELET
Basophils Absolute: 0 10*3/uL (ref 0.0–0.1)
Basophils Relative: 0 %
Eosinophils Absolute: 0 10*3/uL (ref 0.0–0.7)
Eosinophils Relative: 0 %
HCT: 49.9 % — ABNORMAL HIGH (ref 36.0–46.0)
Hemoglobin: 15.7 g/dL — ABNORMAL HIGH (ref 12.0–15.0)
Lymphocytes Relative: 21 %
Lymphs Abs: 2.4 10*3/uL (ref 0.7–4.0)
MCH: 26.8 pg (ref 26.0–34.0)
MCHC: 31.5 g/dL (ref 30.0–36.0)
MCV: 85.2 fL (ref 78.0–100.0)
Monocytes Absolute: 0.7 10*3/uL (ref 0.1–1.0)
Monocytes Relative: 7 %
Neutro Abs: 8 10*3/uL — ABNORMAL HIGH (ref 1.7–7.7)
Neutrophils Relative %: 72 %
Platelets: 129 10*3/uL — ABNORMAL LOW (ref 150–400)
RBC: 5.86 MIL/uL — ABNORMAL HIGH (ref 3.87–5.11)
RDW: 16.2 % — ABNORMAL HIGH (ref 11.5–15.5)
WBC: 11.1 10*3/uL — ABNORMAL HIGH (ref 4.0–10.5)

## 2017-09-21 LAB — BASIC METABOLIC PANEL
Anion gap: 14 (ref 5–15)
BUN: 28 mg/dL — ABNORMAL HIGH (ref 6–20)
CO2: 23 mmol/L (ref 22–32)
Calcium: 8.8 mg/dL — ABNORMAL LOW (ref 8.9–10.3)
Chloride: 105 mmol/L (ref 101–111)
Creatinine, Ser: 1.44 mg/dL — ABNORMAL HIGH (ref 0.44–1.00)
GFR calc Af Amer: 41 mL/min — ABNORMAL LOW (ref >60)
GFR calc non Af Amer: 35 mL/min — ABNORMAL LOW (ref >60)
Glucose, Bld: 169 mg/dL — ABNORMAL HIGH (ref 65–99)
Potassium: 3 mmol/L — ABNORMAL LOW (ref 3.5–5.1)
Sodium: 142 mmol/L (ref 135–145)

## 2017-09-21 LAB — D-DIMER, QUANTITATIVE: D-Dimer, Quant: 0.36 ug/mL-FEU (ref 0.00–0.50)

## 2017-09-21 MED ORDER — METOPROLOL TARTRATE 5 MG/5ML IV SOLN
5.0000 mg | Freq: Once | INTRAVENOUS | Status: AC
  Administered 2017-09-21: 5 mg via INTRAVENOUS
  Filled 2017-09-21: qty 5

## 2017-09-21 MED ORDER — METHYLPREDNISOLONE SODIUM SUCC 125 MG IJ SOLR
60.0000 mg | Freq: Four times a day (QID) | INTRAMUSCULAR | Status: DC
  Administered 2017-09-21 – 2017-09-24 (×12): 60 mg via INTRAVENOUS
  Filled 2017-09-21 (×12): qty 2

## 2017-09-21 NOTE — Progress Notes (Signed)
DAILY PROGRESS NOTE   Patient Name: [name] [name] Date of Encounter: 09/21/2017  Chief Complaint   A-fib rate remains elevated  Patient Profile   [name] [name] is a 72 y.o. female with a history of  CAD s/p CABG in 2016, Ischemic CM with improvement in EF to normal post CABG, atrial fibrillation w/ CHA2DS2 VASc score of 5, s/p LAA clipping and is on chronic OAC w/ Xarelto, HTN and questionable med compliance, presenting to the ED with CC of dyspnea and palpitations, found to be in atrial fibrillation w/ RVR and acute on chronic diastolic CHF.   Subjective   Net negative about 1L overnight - remains deconditioned. Will need SNF placement. Weight down to 195 lbs.  Objective   Vitals:   09/21/17 0900 09/21/17 0917 09/21/17 1100 09/21/17 1200  BP:    114/90  Pulse:      Resp: (!) 31  16 (!) 24  Temp:    98.3 F (36.8 C)  TempSrc:    Oral  SpO2: 93% 100%  100%  Weight:      Height:        Intake/Output Summary (Last 24 hours) at 09/21/2017 1230 Last data filed at 09/21/2017 1118 Gross per 24 hour  Intake 840 ml  Output 1601 ml  Net -761 ml   Filed Weights   09/18/17 0500 09/20/17 0259 09/21/17 0211  Weight: 242 lb 15.2 oz (110.2 kg) 201 lb 8 oz (91.4 kg) 195 lb 12.3 oz (88.8 kg)    Physical Exam   General appearance: Awake, no wheezing today Neck: no carotid bruit, no JVD and thyroid not enlarged, symmetric, no tenderness/mass/nodules Lungs: diminished breath sounds bibasilar Heart: irregularly irregular rhythm and tachycardic Abdomen: soft, non-tender; bowel sounds normal; no masses,  no organomegaly and obese Extremities: extremities normal, atraumatic, no cyanosis or edema Pulses: 2+ and symmetric Skin: Skin color, texture, turgor normal. No rashes or lesions Neurologic: Mental status: Alert, oriented, thought content appropriate Psych: Pleasant  Inpatient Medications    Scheduled Meds: . atorvastatin  10 mg Oral q1800  . budesonide (PULMICORT) nebulizer  solution  0.5 mg Nebulization BID  . diltiazem  360 mg Oral Daily  . famotidine  40 mg Oral QHS  . febuxostat  40 mg Oral Daily  . furosemide  60 mg Oral BID  . magnesium oxide  400 mg Oral TID  . metoprolol succinate  50 mg Oral BID  . pantoprazole  40 mg Oral Q1200  . rivaroxaban  20 mg Oral Q supper  . sodium chloride flush  3 mL Intravenous Q12H    Continuous Infusions: . sodium chloride    . sodium chloride      PRN Meds: sodium chloride, acetaminophen, alum & mag hydroxide-simeth, haloperidol lactate, levalbuterol, sodium chloride flush   Labs   No results found for this or any previous visit (from the past 48 hour(s)).  ECG   N/A  Telemetry   A-fib with RVR - Personally Reviewed  Radiology    Dg Chest Port 1 View  Result Date: 09/21/2017 CLINICAL DATA:  Tachycardia, shortness of breath, and wheezing. EXAM: PORTABLE CHEST 1 VIEW COMPARISON:  Chest x-ray dated September 19, 2017. FINDINGS: Stable cardiomegaly status post CABG and left atrial appendage clipping. Mild vascular congestion is unchanged. Stable lingular atelectasis/scarring. No consolidation, pleural effusion, or pneumothorax. No acute osseous abnormality. IMPRESSION: Stable cardiomegaly and vascular congestion. Electronically Signed   By: Titus Dubin M.D.   On: 09/21/2017 10:46  Cardiac Studies   N/A  Assessment   Principal Problem:   Atrial fibrillation with RVR (HCC) Active Problems:   Hypokalemia   Chronic diastolic CHF (congestive heart failure) (HCC)   Essential hypertension   S/P CABG x 2   CKD (chronic kidney disease), stage III (HCC)   Cardiomyopathy, ischemic   Acute diastolic CHF (congestive heart failure) (HCC)   Medication noncompliance due to cognitive impairment   Acute drug-induced gout of right foot   Thrombocytopenia ([place])   Influenza with respiratory manifestation flu a + 09/14/16 started on tamiflu 09/15/17   Acute on chronic congestive heart failure ([place])   Plan    1. Remains net negative on diuretics.  Creatinine improved. Would re-assess labs prior to discharge.  Adjusted meds for rate-control yesterday - would continue. Patient is ready from a cardiac standpoint for d/c to SNF.  Time Spent Directly with Patient:  I have spent a total of 25 minutes with the patient reviewing hospital notes, telemetry, EKGs, labs and examining the patient as well as establishing an assessment and plan that was discussed personally with the patient. > 50% of time was spent in direct patient care.  Length of Stay:  LOS: 16 days   [name], MD, [facility], [place] Director of the Advanced Lipid Disorders &  Cardiovascular Risk Reduction Clinic Diplomate of the American Board of Clinical Lipidology Attending Cardiologist  Direct Dial: [phone]  Fax: [phone]  Website:  [email or web address] 09/21/2017, 12:30 PM

## 2017-09-21 NOTE — Progress Notes (Addendum)
[facility]                                                                              Patient Demographics  [name] [name], is a 72 y.o. female, DOB - [birth year], FFM:[id number]  Admit date - 09/05/2017   Admitting Physician [name], MD  Outpatient Primary MD for the patient is [name], MD  Outpatient specialists:   LOS - 16  days   Medical records reviewed and are as summarized below:    Chief Complaint  Patient presents with  . Atrial Fibrillation       Brief summary   [name] [name] an 72 y.o.femaleis a 72yo female admitted to cardiology on 1/22 due to atrial fibrillation with RVR. Pt treated with Cardizem and has had rate control. Pt was decided to not be a candidate for ablation. Pt was scheduled to be discharged on 1/30 with home health and follow up in A fib clinic. Pt was noted to have develop a fever of 101.4, spiked to 103 F with coughing, subsequently developed confusion and paranoia. [facility] was consulted for the above. Given predominant pulmonary issues at this point, [facility] will assumed care on 2/5. Off of the diltiazem drip on 2/6. Tamiflu completed 2/5 for flu.   Dgtr is very anxious about mother going home without being better. Lives in [place] and can't come. Hoyle Sauer is local and visits patient.  Last called dgtr 09/21/17  Assessment & Plan    Principal Problem:   Atrial fibrillation with RVR ([place]) -Management per cardiology, HR improving, started on diltiazem by cardiology. Continue metoprolol -Continue Xarelto - am labs ordered  Active Problems: Influenza A, acute hypoxic respiratory failure likely secondary to underlying undiagnosed COPD with exacerbation - Patient met sepsis criteria due to fever, tachycardia and tachypnea. -Patient has positive influenza A PCR, started on Tamiflu on 2/1 completed Tamiflu on 2/5. Vancomycin and Zosyn discontinued. -Highly likely patient has undiagnosed COPD due to  long-standing history of tobacco use and wheezing, hypoxia and component of VCD -CCM consulted, recommended continue as needed Xopenex, PPI, Pulmicort while inpatient -Will likely need PFTs and outpatient follow-up with pulmonology after discharge.   Mild acute on chronic kidney disease stage III -Currently on Lasix,creatinine stable and improving  - labs pending  Acute metabolic encephalopathy/paranoia in the setting of dementia and acute hypoxic respiratory failure -ABGs did not show hypercarbia, suspecting also has some degree of dementia.    Recent acute gout - Currently stable, no acute issues, continue uloric   Chronic thrombocytopenia -No bleeding, monitor counts, slowly has been trending down  Ischemic cardiomyopathy, acute on chronic diastolic CHF, coronary artery disease status post CABG x2, hypertension -BP currently stable, continue diltiazem, metoprolol, Lasix -Cardiology following   Code Status: Full CODE STATUS DVT Prophylaxis: Xarelto Family Communication: Discussed in detail with the patient, all imaging results, lab results explained to the patient   Disposition Plan: Assumed care on 2/5, not medically ready  Time Spent in minutes   35 minutes  Procedures:    Consultants:    Antimicrobials:   Tamiflu day #5 today   Medications  Scheduled  Meds: . atorvastatin  10 mg Oral q1800  . budesonide (PULMICORT) nebulizer solution  0.5 mg Nebulization BID  . diltiazem  360 mg Oral Daily  . famotidine  40 mg Oral QHS  . febuxostat  40 mg Oral Daily  . furosemide  60 mg Oral BID  . magnesium oxide  400 mg Oral TID  . methylPREDNISolone (SOLU-MEDROL) injection  60 mg Intravenous Q6H  . metoprolol succinate  50 mg Oral BID  . pantoprazole  40 mg Oral Q1200  . rivaroxaban  20 mg Oral Q supper  . sodium chloride flush  3 mL Intravenous Q12H   Continuous Infusions: . sodium chloride    . sodium chloride     PRN Meds:.sodium chloride, acetaminophen, alum &  mag hydroxide-simeth, haloperidol lactate, levalbuterol, sodium chloride flush   Antibiotics   Anti-infectives (From admission, onward)   Start     Dose/Rate Route Frequency Ordered Stop   09/17/17 1000  doxycycline (VIBRA-TABS) tablet 100 mg     100 mg Oral Every 12 hours 09/17/17 0736 09/17/17 2037   09/15/17 0800  vancomycin (VANCOCIN) 1,250 mg in sodium chloride 0.9 % 250 mL IVPB  Status:  Discontinued     1,250 mg 166.7 mL/hr over 90 Minutes Intravenous Every 24 hours 09/14/17 1027 09/15/17 0913   09/15/17 0630  oseltamivir (TAMIFLU) capsule 30 mg     30 mg Oral 2 times daily 09/15/17 0618 09/19/17 2226   09/14/17 1800  piperacillin-tazobactam (ZOSYN) IVPB 3.375 g  Status:  Discontinued     3.375 g 12.5 mL/hr over 240 Minutes Intravenous Every 8 hours 09/14/17 1027 09/17/17 0734   09/14/17 1030  vancomycin (VANCOCIN) 1,500 mg in sodium chloride 0.9 % 500 mL IVPB     1,500 mg 250 mL/hr over 120 Minutes Intravenous  Once 09/14/17 1027 09/14/17 1442   09/14/17 1015  vancomycin (VANCOCIN) IVPB 1000 mg/200 mL premix  Status:  Discontinued     1,000 mg 200 mL/hr over 60 Minutes Intravenous  Once 09/14/17 0935 09/14/17 1730   09/14/17 0945  piperacillin-tazobactam (ZOSYN) IVPB 3.375 g     3.375 g 100 mL/hr over 30 Minutes Intravenous  Once 09/14/17 0935 09/14/17 1143        Subjective:   [name] [name] was seen and examined today.  Oriented x 3. Per nursing no issues. Tachycardia on monitor. Denies SOB and CP.   Objective:   Vitals:   09/21/17 0900 09/21/17 0917 09/21/17 1100 09/21/17 1200  BP:    114/90  Pulse:      Resp: (!) 31  16 (!) 24  Temp:    98.3 F (36.8 C)  TempSrc:    Oral  SpO2: 93% 100%  100%  Weight:      Height:        Intake/Output Summary (Last 24 hours) at 09/21/2017 1339 Last data filed at 09/21/2017 1118 Gross per 24 hour  Intake 840 ml  Output 1601 ml  Net -761 ml     Wt Readings from Last 3 Encounters:  09/21/17 88.8 kg (195 lb 12.3 oz)    08/16/17 101.1 kg (222 lb 12.8 oz)  08/06/17 99.2 kg (218 lb 9.6 oz)     Exam   General: Alert and oriented x 3, NAD  Eyes:  HEENT:    Cardiovascular: S1 S2 auscultated, no rubs, murmurs or gallops. Regular rate and rhythm. No pedal edema b/l  Respiratory: diffuse wheeze  Gastrointestinal: Soft, nontender, nondistended, + bowel sounds  Ext: no  pedal edema bilaterally  Neuro: no new deficits  Musculoskeletal: No digital cyanosis, clubbing  Skin: No rashes  Psych: Normal affect and demeanor, alert and oriented x3    Data Reviewed:  I have personally reviewed following labs and imaging studies  Micro Results Recent Results (from the past 240 hour(s))  Culture, blood (routine x 2)     Status: None   Collection Time: 09/14/17  2:08 AM  Result Value Ref Range Status   Specimen Description BLOOD LEFT HAND  Final   Special Requests   Final    BOTTLES DRAWN AEROBIC AND ANAEROBIC Blood Culture adequate volume   Culture   Final    NO GROWTH 5 DAYS Performed at [facility], [address] [place], [place] [address]    Report Status 09/19/2017 FINAL  Final  Culture, blood (routine x 2)     Status: None   Collection Time: 09/14/17  2:16 AM  Result Value Ref Range Status   Specimen Description BLOOD RIGHT HAND  Final   Special Requests IN PEDIATRIC BOTTLE Blood Culture adequate volume  Final   Culture   Final    NO GROWTH 5 DAYS Performed at [facility], [place] [address], [place], [place] [address]    Report Status 09/19/2017 FINAL  Final  Respiratory Panel by PCR     Status: Abnormal   Collection Time: 09/14/17  8:10 AM  Result Value Ref Range Status   Adenovirus NOT DETECTED NOT DETECTED Final   Coronavirus 229E NOT DETECTED NOT DETECTED Final   Coronavirus HKU1 NOT DETECTED NOT DETECTED Final   Coronavirus NL63 NOT DETECTED NOT DETECTED Final   Coronavirus OC43 NOT DETECTED NOT DETECTED Final   Metapneumovirus NOT DETECTED NOT DETECTED Final    Rhinovirus / Enterovirus NOT DETECTED NOT DETECTED Final   Influenza A H3 DETECTED (A) NOT DETECTED Final   Influenza B NOT DETECTED NOT DETECTED Final   Parainfluenza Virus 1 NOT DETECTED NOT DETECTED Final   Parainfluenza Virus 2 NOT DETECTED NOT DETECTED Final   Parainfluenza Virus 3 NOT DETECTED NOT DETECTED Final   Parainfluenza Virus 4 NOT DETECTED NOT DETECTED Final   Respiratory Syncytial Virus NOT DETECTED NOT DETECTED Final   Bordetella pertussis NOT DETECTED NOT DETECTED Final   Chlamydophila pneumoniae NOT DETECTED NOT DETECTED Final   Mycoplasma pneumoniae NOT DETECTED NOT DETECTED Final  Urine culture     Status: None   Collection Time: 09/14/17  9:02 AM  Result Value Ref Range Status   Specimen Description URINE, CLEAN CATCH  Final   Special Requests NONE  Final   Culture   Final    NO GROWTH Performed at [facility], [address], [place], [place] [address]    Report Status 09/16/2017 FINAL  Final    Radiology Reports Dg Chest 2 View  Result Date: 09/14/2017 CLINICAL DATA:  Fever, cough, and lethargy. EXAM: CHEST  2 VIEW COMPARISON:  09/05/2017 FINDINGS: The patient is mildly rotated to the right. The cardiac silhouette remains enlarged. Sequelae of CABG and left atrial appendage clipping are again identified. Pulmonary vascular congestion and diffuse interstitial lung opacities have improved. Mild residual diffuse interstitial prominence is at least partly chronic. No focal airspace consolidation is identified. There is mild scarring in the left mid to lower lung. No sizable pleural effusion or pneumothorax is identified. No acute osseous abnormality is seen. IMPRESSION: Cardiomegaly with improved pulmonary vascular congestion and interstitial edema. Electronically Signed   By:  Logan Bores M.D.   On: 09/14/2017 09:14   Dg Chest Port 1 View  Result Date: 09/21/2017 CLINICAL DATA:  Tachycardia, shortness of breath, and wheezing. EXAM: PORTABLE CHEST 1 VIEW  COMPARISON:  Chest x-ray dated September 19, 2017. FINDINGS: Stable cardiomegaly status post CABG and left atrial appendage clipping. Mild vascular congestion is unchanged. Stable lingular atelectasis/scarring. No consolidation, pleural effusion, or pneumothorax. No acute osseous abnormality. IMPRESSION: Stable cardiomegaly and vascular congestion. Electronically Signed   By: Titus Dubin M.D.   On: 09/21/2017 10:46   Dg Chest Port 1 View  Result Date: 09/19/2017 CLINICAL DATA:  Respiratory failure, shortness of breath, wheezing EXAM: PORTABLE CHEST 1 VIEW COMPARISON:  09/17/2017 FINDINGS: Cardiomegaly. Prior CABG. Mild pulmonary vascular congestion. Lingular atelectasis. No confluent opacities otherwise. No effusions or overt edema. No pneumothorax. No acute bony abnormality. IMPRESSION: Cardiomegaly with mild vascular congestion. Lingular atelectasis. No change. Electronically Signed   By: Rolm Baptise M.D.   On: 09/19/2017 10:19   Dg Chest Port 1 View  Result Date: 09/17/2017 CLINICAL DATA:  Dyspnea. EXAM: PORTABLE CHEST 1 VIEW COMPARISON:  09/14/2017 FINDINGS: Stable postsurgical changes from CABG. Stable appearance of left atrial appendage clamp. Enlarged cardiac silhouette. Calcific atherosclerotic disease and tortuosity of the aorta. Mild pulmonary vascular congestion. Stable left mid lung field scarring versus atelectasis. Osseous structures are without acute abnormality. Soft tissues are grossly normal. IMPRESSION: Persistently enlarged cardiac silhouette may represent cardiomyopathy versus pericardial effusion. Mild pulmonary vascular congestion. No evidence of pneumothorax. Electronically Signed   By: Fidela Salisbury M.D.   On: 09/17/2017 11:19   Dg Chest Portable 1 View  Result Date: 09/05/2017 CLINICAL DATA:  Short of breath for 3 days EXAM: PORTABLE CHEST 1 VIEW COMPARISON:  08/04/2017 FINDINGS: Sternotomy wires overlie stable cardiac silhouette. Bibasilar atelectasis. Mild central  venous congestion. Mild peripheral interstitial edema. No focal consolidation. No pneumothorax. IMPRESSION: 1. Mild increase in interstitial edema pattern. 2. Mild bibasilar atelectasis. Electronically Signed   By: Suzy Bouchard M.D.   On: 09/05/2017 09:09    Lab Data:  CBC: Recent Labs  Lab 09/15/17 0156 09/18/17 0431  WBC 5.4 11.8*  HGB 14.0 14.8  HCT 44.0 46.1*  MCV 87.0 86.8  PLT 93* 89*   Basic Metabolic Panel: Recent Labs  Lab 09/15/17 0156 09/16/17 0142 09/17/17 0444 09/18/17 0431  NA 141 142 138 140  K 4.1 4.4 5.6* 3.9  CL 105 108 109 107  CO2 24 24 19* 22  GLUCOSE 118* 120* 169* 152*  BUN 24* 20 18 22*  CREATININE 1.78* 1.60* 1.21* 1.23*  CALCIUM 8.4* 8.6* 9.3 9.4   GFR: Estimated Creatinine Clearance: 47.3 mL/min (A) (by C-G formula based on SCr of 1.23 mg/dL (H)). Liver Function Tests: No results for input(s): AST, ALT, ALKPHOS, BILITOT, PROT, ALBUMIN in the last 168 hours. No results for input(s): LIPASE, AMYLASE in the last 168 hours. No results for input(s): AMMONIA in the last 168 hours. Coagulation Profile: No results for input(s): INR, PROTIME in the last 168 hours. Cardiac Enzymes: No results for input(s): CKTOTAL, CKMB, CKMBINDEX, TROPONINI in the last 168 hours. BNP (last 3 results) No results for input(s): PROBNP in the last 8760 hours. HbA1C: No results for input(s): HGBA1C in the last 72 hours. CBG: No results for input(s): GLUCAP in the last 168 hours. Lipid Profile: No results for input(s): CHOL, HDL, LDLCALC, TRIG, CHOLHDL, LDLDIRECT in the last 72 hours. Thyroid Function Tests: No results for input(s): TSH, T4TOTAL, FREET4, T3FREE, THYROIDAB  in the last 72 hours. Anemia Panel: No results for input(s): VITAMINB12, FOLATE, FERRITIN, TIBC, IRON, RETICCTPCT in the last 72 hours. Urine analysis:    Component Value Date/Time   COLORURINE YELLOW 09/14/2017 0336   APPEARANCEUR CLEAR 09/14/2017 0336   LABSPEC 1.010 09/14/2017 0336    PHURINE 6.0 09/14/2017 0336   GLUCOSEU NEGATIVE 09/14/2017 0336   HGBUR NEGATIVE 09/14/2017 0336   BILIRUBINUR NEGATIVE 09/14/2017 0336   BILIRUBINUR ng 12/25/2013 1342   KETONESUR NEGATIVE 09/14/2017 0336   PROTEINUR NEGATIVE 09/14/2017 0336   UROBILINOGEN 0.2 04/11/2017 1402   NITRITE NEGATIVE 09/14/2017 0336   LEUKOCYTESUR NEGATIVE 09/14/2017 1798     Elwin Mocha M.D. [facility] 09/21/2017, 1:39 PM  Pager: [phone] Between 7am to 7pm - call Pager - [phone]  After 7pm go to [email or web address] - password [email or web address]  Call night coverage person covering after 7pm

## 2017-09-21 NOTE — Progress Notes (Signed)
Physical Therapy Treatment Patient Details Name: [name] [name] MRN: [id number] DOB: 10/13/1945 Today's Date: 09/21/2017    History of Present Illness Pt adm with afib with rvr and acute heart failure. Pt also developed painful rt foot. Pt planned to d/c on 09/13/17 but desaturated on RA with activity to 77%, kept for observation, developed a fever and tested positive for influenza A. Pt has also developed delirium. PMH - CAD, s/p CABG, parox afib, HTN, ICM, gout    PT Comments    Pt initially lethargic but once aroused able to participate. Pt with improving mobility. Continue to recommend SNF at [place] for further rehab.   Follow Up Recommendations  SNF     Equipment Recommendations  Other (comment)(rollator)    Recommendations for Other Services       Precautions / Restrictions Precautions Precautions: Fall Restrictions Weight Bearing Restrictions: No    Mobility  Bed Mobility Overal bed mobility: Needs Assistance Bed Mobility: Supine to Sit;Sit to Supine     Supine to sit: Mod assist Sit to supine: Supervision   General bed mobility comments: Assist to elevate trunk due to lethargy  Transfers Overall transfer level: Needs assistance Equipment used: 4-wheeled walker Transfers: Sit to/from Stand Sit to Stand: Min assist         General transfer comment: Assist for safety and balance  Ambulation/Gait Ambulation/Gait assistance: Min assist Ambulation Distance (Feet): 175 Feet Assistive device: 4-wheeled walker Gait Pattern/deviations: Step-through pattern;Decreased stride length;Wide base of support Gait velocity: decreased Gait velocity interpretation: Below normal speed for age/gender General Gait Details: Assist for balance and support. Amb on 2L of O2 with SpO2 > 93%   Stairs            Wheelchair Mobility    Modified Rankin (Stroke Patients Only)       Balance Overall balance assessment: Needs assistance Sitting-balance support: Feet  supported Sitting balance-Leahy Scale: Good     Standing balance support: Bilateral upper extremity supported;During functional activity Standing balance-Leahy Scale: Poor Standing balance comment: UE support and min guard for static standing                            Cognition Arousal/Alertness: Lethargic Behavior During Therapy: Flat affect Overall Cognitive Status: Impaired/Different from baseline Area of Impairment: Following commands;Awareness;Problem solving;Safety/judgement;Memory                   Current Attention Level: Sustained Memory: Decreased short-term memory Following Commands: Follows one step commands consistently Safety/Judgement: Decreased awareness of safety;Decreased awareness of deficits Awareness: Intellectual Problem Solving: Requires verbal cues;Slow processing General Comments: Pt initially lethargic but once aroused able to maintain arousal and participate      Exercises      General Comments        Pertinent Vitals/Pain Pain Assessment: Faces Faces Pain Scale: No hurt    Home Living                      Prior Function            PT Goals (current goals can now be found in the care plan section) Progress towards PT goals: Progressing toward goals    Frequency    Min 2X/week      PT Plan Current plan remains appropriate    Co-evaluation              AM-PAC PT "6 Clicks" Daily Activity  Outcome  Measure  Difficulty turning over in bed (including adjusting bedclothes, sheets and blankets)?: A Little Difficulty moving from lying on back to sitting on the side of the bed? : Unable Difficulty sitting down on and standing up from a chair with arms (e.g., wheelchair, bedside commode, etc,.)?: Unable Help needed moving to and from a bed to chair (including a wheelchair)?: A Little Help needed walking in hospital room?: A Little Help needed climbing 3-5 steps with a railing? : A Lot 6 Click Score:  13    End of Session Equipment Utilized During Treatment: Gait belt;Oxygen Activity Tolerance: Patient tolerated treatment well Patient left: with call bell/phone within reach;with nursing/sitter in room;in bed Nurse Communication: Mobility status PT Visit Diagnosis: Unsteadiness on feet (R26.81);Muscle weakness (generalized) (M62.81);Difficulty in walking, not elsewhere classified (R26.2)     Time: 1010-1026 PT Time Calculation (min) (ACUTE ONLY): 16 min  Charges:  $Gait Training: 8-22 mins                    G Codes:       [facility] PT [facility] 09/21/2017, 12:15 PM

## 2017-09-21 NOTE — Plan of Care (Signed)
  Progressing Education: Knowledge of General Education information will improve 09/21/2017 0016 - Progressing by Blair Promise, RN

## 2017-09-22 DIAGNOSIS — J9601 Acute respiratory failure with hypoxia: Secondary | ICD-10-CM

## 2017-09-22 DIAGNOSIS — I10 Essential (primary) hypertension: Secondary | ICD-10-CM

## 2017-09-22 DIAGNOSIS — D696 Thrombocytopenia, unspecified: Secondary | ICD-10-CM

## 2017-09-22 LAB — CBC WITH DIFFERENTIAL/PLATELET
Basophils Absolute: 0 10*3/uL (ref 0.0–0.1)
Basophils Relative: 0 %
Eosinophils Absolute: 0 10*3/uL (ref 0.0–0.7)
Eosinophils Relative: 0 %
HCT: 48.2 % — ABNORMAL HIGH (ref 36.0–46.0)
Hemoglobin: 15.7 g/dL — ABNORMAL HIGH (ref 12.0–15.0)
Lymphocytes Relative: 10 %
Lymphs Abs: 1 10*3/uL (ref 0.7–4.0)
MCH: 27.3 pg (ref 26.0–34.0)
MCHC: 32.6 g/dL (ref 30.0–36.0)
MCV: 83.8 fL (ref 78.0–100.0)
Monocytes Absolute: 0.2 10*3/uL (ref 0.1–1.0)
Monocytes Relative: 2 %
Neutro Abs: 8.5 10*3/uL — ABNORMAL HIGH (ref 1.7–7.7)
Neutrophils Relative %: 88 %
Platelets: 124 10*3/uL — ABNORMAL LOW (ref 150–400)
RBC: 5.75 MIL/uL — ABNORMAL HIGH (ref 3.87–5.11)
RDW: 16.2 % — ABNORMAL HIGH (ref 11.5–15.5)
WBC: 9.6 10*3/uL (ref 4.0–10.5)

## 2017-09-22 LAB — BASIC METABOLIC PANEL
Anion gap: 14 (ref 5–15)
BUN: 26 mg/dL — ABNORMAL HIGH (ref 6–20)
CO2: 22 mmol/L (ref 22–32)
Calcium: 9.1 mg/dL (ref 8.9–10.3)
Chloride: 104 mmol/L (ref 101–111)
Creatinine, Ser: 1.27 mg/dL — ABNORMAL HIGH (ref 0.44–1.00)
GFR calc Af Amer: 48 mL/min — ABNORMAL LOW (ref >60)
GFR calc non Af Amer: 41 mL/min — ABNORMAL LOW (ref >60)
Glucose, Bld: 215 mg/dL — ABNORMAL HIGH (ref 65–99)
Potassium: 3.2 mmol/L — ABNORMAL LOW (ref 3.5–5.1)
Sodium: 140 mmol/L (ref 135–145)

## 2017-09-22 MED ORDER — BENZONATATE 100 MG PO CAPS: 100.0000 mg | ORAL_CAPSULE | Freq: Four times a day (QID) | ORAL | 0 refills | Status: DC | PRN

## 2017-09-22 MED ORDER — LEVALBUTEROL HCL 0.63 MG/3ML IN NEBU: 0.6300 mg | INHALATION_SOLUTION | Freq: Four times a day (QID) | RESPIRATORY_TRACT | 12 refills | Status: DC | PRN

## 2017-09-22 MED ORDER — POTASSIUM CHLORIDE CRYS ER 20 MEQ PO TBCR
40.0000 meq | EXTENDED_RELEASE_TABLET | ORAL | Status: AC
Start: 2017-09-22 — End: 2017-09-22
  Administered 2017-09-22 (×2): 40 meq via ORAL
  Filled 2017-09-22 (×2): qty 2

## 2017-09-22 MED ORDER — PREDNISONE 10 MG PO TABS: ORAL_TABLET | ORAL | 0 refills | Status: DC

## 2017-09-22 MED ORDER — PANTOPRAZOLE SODIUM 40 MG PO TBEC: 40.0000 mg | DELAYED_RELEASE_TABLET | Freq: Every day | ORAL | Status: DC

## 2017-09-22 MED ORDER — GUAIFENESIN ER 600 MG PO TB12: 600.0000 mg | ORAL_TABLET | Freq: Two times a day (BID) | ORAL | 2 refills | Status: DC

## 2017-09-22 MED ORDER — BUDESONIDE 0.5 MG/2ML IN SUSP: 0.5000 mg | Freq: Two times a day (BID) | RESPIRATORY_TRACT | 12 refills | Status: DC

## 2017-09-22 MED ORDER — METOPROLOL SUCCINATE ER 50 MG PO TB24: 50.0000 mg | ORAL_TABLET | Freq: Two times a day (BID) | ORAL | Status: DC

## 2017-09-22 NOTE — Clinical Social Work Note (Signed)
After discussion with RNCM, patient's daughter now agreeable to SNF placement. First preference is [facility] as it is so close to her home. CSW discussed with admissions coordinator. She has to see if they are in network with her insurance company. Patient's daughter notified of $10-20 per day copay for SNF placement with [employer]. She stated as long as they bill her after rehab she should be able to pay.  Dayton Scrape, [place]

## 2017-09-22 NOTE — Consult Note (Signed)
   [facility] Inpatient Consult   09/22/2017  [name] [name] March 25, 1946 [id number]  Follow up:  Notified by inpatient RNCM that the patient's daughter in [place] has declined patient going to skilled nursing facility.  Patient had previously agreed to [facility] services.  Will assign to [place] worker and [place] for post hospital follow up and needs.  Current, disposition was not completed as inpatient RNCM is still assessing discharge needs.  Natividad Brood, RN BSN [facility] Liaison  [phone] business mobile phone Toll free office [phone]

## 2017-09-22 NOTE — NC FL2 (Signed)
[place] LEVEL OF CARE SCREENING TOOL     IDENTIFICATION  Patient Name: [name] [name] Birthdate: [birth year] Sex: female Admission Date (Current Location): 09/05/2017  [facility] and [place] Number:  Herbalist and Address:  [facility], [place] [address] [place], [place] [address]      Provider Number: [id number]  Attending Physician Name and Address:  Debbe Odea, MD  Relative Name and Phone Number:       Current Level of Care: Hospital Recommended Level of Care: [place] Prior Approval Number:    Date Approved/Denied:   PASRR Number: [id number]  Discharge Plan: SNF    Current Diagnoses: Patient Active Problem List   Diagnosis Date Noted  . Acute on chronic congestive heart failure ([place])   . Influenza with respiratory manifestation flu a + 09/14/16 started on tamiflu 09/15/17 09/19/2017  . Thrombocytopenia ([place]) 09/14/2017  . Acute drug-induced gout of right foot   . Medication noncompliance due to cognitive impairment 09/06/2017  . Acute diastolic CHF (congestive heart failure) ([place]) 08/06/2017  . LGI bleed, likely hemorrhoids 08/06/2017  . Atrial fibrillation ([place]) 08/04/2017  . Paroxysmal atrial fibrillation ([place]) 02/08/2017  . Cardiomyopathy, ischemic 02/08/2017  . Dysphagia 02/08/2017  . CKD (chronic kidney disease), stage III ([place]) 01/30/2017  . Pain in shoulder 02/08/2016  . Breast pain, left 02/08/2016  . Bilateral arm numbness and tingling while sleeping 02/08/2016  . Painful lumpy left breast 09/23/2015  . [place] intertrigo 02/11/2015  . S/P CABG x 2 11/14/2014  . Accelerated hypertension   . Cardiac arrest ([place]) 11/04/2014  . Left main coronary artery disease 11/04/2014  . Coronary artery disease due to lipid rich plaque   . Acute respiratory failure with hypoxemia ([place])   . Essential hypertension   . Atrial fibrillation with RVR ([place])   . Hypokalemia 10/30/2014  . Chronic diastolic CHF  (congestive heart failure) ([place]) 10/30/2014  . NSTEMI (non-ST elevated myocardial infarction) ([place])   . DOE (dyspnea on exertion)   . CAP (community acquired pneumonia) 10/29/2014    Orientation RESPIRATION BLADDER Height & Weight     Self  O2(2L [place]) Continent Weight: 198 lb 11.2 oz (90.1 kg) Height:  5\' 7"  (170.2 cm)  BEHAVIORAL SYMPTOMS/MOOD NEUROLOGICAL BOWEL NUTRITION STATUS      Continent Diet(2g sodium diet)  AMBULATORY STATUS COMMUNICATION OF NEEDS Skin   Limited Assist Verbally Normal                       Personal Care Assistance Level of Assistance  Bathing, Dressing Bathing Assistance: Limited assistance   Dressing Assistance: Limited assistance     Functional Limitations Info             SPECIAL CARE FACTORS FREQUENCY  PT (By licensed PT), OT (By licensed OT)     PT Frequency: 5/wk OT Frequency: 5/wk            Contractures      Additional Factors Info  Code Status, Allergies, Isolation Precautions Code Status Info: FULL Allergies Info: Bee Venom, Codeine, Shrimp Shellfish Allergy, Tomato     Isolation Precautions Info: Droplet     Current Medications (09/22/2017):  This is the current hospital active medication list Current Facility-Administered Medications  Medication Dose Route Frequency Provider Last Rate Last Dose  . 0.9 %  sodium chloride infusion  250 mL Intravenous PRN Lyda Jester M, PA-C      . acetaminophen (TYLENOL)  tablet 1,000 mg  1,000 mg Oral Q6H PRN Melina Copa N, PA-C   1,000 mg at 09/22/17 0841  . alum & mag hydroxide-simeth (MAALOX/MYLANTA) 200-200-20 MG/5ML suspension 30 mL  30 mL Oral Q6H PRN [name], MD   30 mL at 09/11/17 1258  . atorvastatin (LIPITOR) tablet 10 mg  10 mg Oral q1800 Lyda Jester M, PA-C   10 mg at 09/21/17 1700  . budesonide (PULMICORT) nebulizer solution 0.5 mg  0.5 mg Nebulization BID Rai, Ripudeep K, MD   0.5 mg at 09/22/17 0732  . diltiazem (CARDIZEM CD) 24 hr capsule 360 mg   360 mg Oral Daily Hilty, Nadean Corwin, MD   360 mg at 09/21/17 0929  . famotidine (PEPCID) tablet 40 mg  40 mg Oral QHS Simonne Maffucci B, MD   40 mg at 09/21/17 1935  . febuxostat ([name]) tablet 40 mg  40 mg Oral Daily Lyda Jester M, PA-C   40 mg at 09/21/17 1114  . furosemide (LASIX) tablet 60 mg  60 mg Oral BID [name], MD   60 mg at 09/22/17 0840  . haloperidol lactate (HALDOL) injection 2.5 mg  2.5 mg Intravenous Q6H PRN Geralynn Ochs T, MD   2.5 mg at 09/20/17 2136  . levalbuterol (XOPENEX) nebulizer solution 0.63 mg  0.63 mg Nebulization Q6H PRN Erlene Quan, PA-C   0.63 mg at 09/18/17 1137  . magnesium oxide (MAG-OX) tablet 400 mg  400 mg Oral TID [name], MD   400 mg at 09/21/17 1935  . methylPREDNISolone sodium succinate (SOLU-MEDROL) 125 mg/2 mL injection 60 mg  60 mg Intravenous Q6H Elwin Mocha, MD   60 mg at 09/22/17 0840  . metoprolol succinate (TOPROL-XL) 24 hr tablet 50 mg  50 mg Oral BID [name], MD   50 mg at 09/21/17 1935  . pantoprazole (PROTONIX) EC tablet 40 mg  40 mg Oral Q1200 Simonne Maffucci B, MD   40 mg at 09/21/17 1226  . potassium chloride SA (K-DUR,KLOR-CON) CR tablet 40 mEq  40 mEq Oral Q4H Rizwan, Saima, MD      . rivaroxaban (XARELTO) tablet 20 mg  20 mg Oral Q supper Otilio Miu, RPH   20 mg at 09/21/17 1700  . sodium chloride flush (NS) 0.9 % injection 3 mL  3 mL Intravenous Q12H Lyda Jester M, PA-C   3 mL at 09/21/17 1935  . sodium chloride flush (NS) 0.9 % injection 3 mL  3 mL Intravenous PRN Consuelo Pandy, PA-C         Discharge Medications: Please see discharge summary for a list of discharge medications.  Relevant Imaging Results:  Relevant Lab Results:   Additional Information SS#: [id number]  Jorge Ny, LCSW

## 2017-09-22 NOTE — Care Management Note (Addendum)
Case Management Note Marvetta Gibbons RN, BSN Unit 4E-Case Manager [phone]  Patient Details  Name: [name] [name] MRN: [id number] Date of Birth: 1945/12/25  Subjective/Objective:   Pt admitted with afib and HF                 Action/Plan: PTA Pt lived at home, per PT eval recommendation for HHPT- discussed in LLOS/QC mtg on 1/29- per DR. A- pt appropriate for [place] program and placed with [facility] for f/u on discharge.   Expected Discharge Date:  09/22/17               Expected Discharge Plan:  [place]  In-House Referral:  [facility], Clinical Social Work  Discharge planning Services  CM Consult  Post Acute Care Choice:  Home Health, Durable Medical Equipment Choice offered to:  Patient  DME Arranged:  Walker rolling DME Agency:  [place] Arranged:  RN, Disease Management, PT [place] Agency:  [place]  Status of Service:  Completed, signed off  If discussed at [place] of Stay Meetings, dates discussed:  1/29  Discharge Disposition: skilled facility  Additional Comments:  09/22/17- 1000- Marvetta Gibbons RN, CM- pt has d/c order this AM for SNF- however pt has tele sitter still- will need to have tele sitter discontinued before pt can d/c to SNF- pt remains oriented to self only-  Update- 1530- notified by CSW that pt's daughter has declined SNF placement and wants pt to d/c home- will need to see if pt has anyone at home, transportation and if she need home 02 prior to d/c home. Bedside RN to check 02 sats for qualification of home 02 needs. Call made to Hoyle Sauer pt's cousin who lives here- and per Hoyle Sauer not sure if pt will have ride for tonight or if anyone will be home- (pt has "roommate" but that person is just a live-in room mate not someone to assist pt) Hoyle Sauer to call pt's daughter to discuss situation. Call then received from pt's daughter in [place]- who wanted to discuss pt's transition plan- spoke with daughter at length about pt's  recommendations for SNF as the safest plan, how pt is weak, confused (oriented to self only at present) and the concerns about pt going home in the current state. Re-assured daughter that pt was getting better just needed more time before transitioning home- maybe several weeks in a STSNF for rehab- daughter now is agreeable to STSNF for rehab- discussed pt would not be able to transport until tomorrow afternoon due to having sitter, daughter verbalized understanding- also discussed possible needs after STSNF- such as [place] services or private duty aides. Notified CSW of daughters change of mind regarding placement- CSW to f/u with daughter for bed offered. Daughter to f/u with family here on plan for discharge. CSW will update MD after speaking with daughter.  Spoke with both [place] with [name] and [place] with [facility] to update on current plan. [facility] will f/u in facility.   09/18/17- 1200- Mckinnon Glick RN, CM- pt continues to be confused, tx for Flu, also continues to have problems with afib- not stable for discharge.   09/15/17- 1000- Dayshia Ballinas RN, CM- d/c order canceled- pt now positive for the Flu- and more disoriented, HR elevated- CM to continue to follow- Tommi Rumps with [facility] notified.   09/13/17- 1430- Takeo Harts RN, CM- pt for d/c home today- HH and DME order placed- spoke with pt at bedside- discussed [place]  program with [name]- pt agreeable to services with [facility] for [facility] needs- pt reports that she has a walker at home and does not need new walker at this time- ?home 02 needs- bedside RN Lattie Haw to check with MD regarding home 02.  Dawayne Patricia, RN 09/22/2017, 4:34 PM

## 2017-09-22 NOTE — Progress Notes (Signed)
SATURATION QUALIFICATIONS: (This note is used to comply with regulatory documentation for home oxygen)  Patient Saturations on Room Air at Rest = 97%  Patient Saturations on Room Air while Ambulating = 88%  Patient Saturations on 0 Liters of oxygen while Ambulating = na%  Please briefly explain why patient needs home oxygen: Does not meet criteria.

## 2017-09-22 NOTE — Clinical Social Work Note (Signed)
Clinical Social Work Assessment  Patient Details  Name: [name] [name] MRN: [id number] Date of Birth: [birth year]  Date of referral:  09/22/17               Reason for consult:  Facility Placement, Discharge Planning                Permission sought to share information with:  Family Supports Permission granted to share information::  Yes, Verbal Permission Granted  Name::     [name] [name]  Agency::     Relationship::  Daughter  Contact Information:  [phone]  Housing/Transportation Living arrangements for the past 2 months:  [place] of Information:  Medical Team, Adult Children Patient Interpreter Needed:  None Criminal Activity/Legal Involvement Pertinent to Current Situation/Hospitalization:  No - Comment as needed Significant Relationships:  Adult Children, Other Family Members, Siblings Lives with:  Self Do you feel safe going back to the place where you live?  Yes Need for family participation in patient care:  Yes (Comment)  Care giving concerns:  PT recommending SNF once discharged.   Social Worker assessment / plan:  Patient only oriented to self. CSW called daughter. CSW introduced role and explained that PT recommendations would be discussed. Patient's daughter is not agreeable to SNF placement because she knows that is not what her mother would want and she does not want to go against her wishes. Patient's daughter believes that patient will do better at home than at a facility. Explained concern of new confusion. Even with that, patient's daughter thinks she will heal better at home in her own environment. Patient's daughter stated they will look into SNF placement if she does not do well at home. CSW encouraged her to work with her home health agency or PCP to accomplish this if needed. RNCM and MD aware. MD asked that home health social worker be arranged in addition to other home health services. No further concerns. CSW signing off. Consult again if  any other social work needs arise.  Employment status:  Retired Forensic scientist:  Other (Comment Required)([employer]) PT Recommendations:  [place] / Referral to community resources:  [place]  Patient/Family's Response to care:  Patient only oriented to self. Patient's daughter not agreeable to SNF placement. Patient's family supportive and involved in patient's care. Patient's daughter appreciated social work intervention.  Patient/Family's Understanding of and Emotional Response to Diagnosis, Current Treatment, and Prognosis:  Patient only oriented to self. Patient's daughter has a good understanding of the reason for admission and her need for continued therapy after discharge. Patient's daughter appears happy with hospital care.  Emotional Assessment Appearance:  Appears stated age Attitude/Demeanor/Rapport:  Unable to Assess Affect (typically observed):  Unable to Assess Orientation:  Oriented to Self Alcohol / Substance use:  Never Used Psych involvement (Current and /or in the community):  No (Comment)  Discharge Needs  Concerns to be addressed:  Care Coordination Readmission within the last 30 days:  No Current discharge risk:  Dependent with Mobility, Lives alone, Cognitively Impaired Barriers to Discharge:  No Barriers Identified   Candie Chroman, LCSW 09/22/2017, 3:38 PM

## 2017-09-22 NOTE — Discharge Summary (Addendum)
Physician Discharge Summary  [name] [name]:[id number] DOB: 1945/09/18 DOA: 09/05/2017  PCP: [name], MD  Admit date: 09/05/2017 Discharge date: 09/24/2017  Admitted From: home Disposition:  SNF recommended but family has declined SNF  Recommendations for Outpatient Follow-up:  1. Needs PFTs once current respiratory issues resolve   Discharge Condition:  stable   CODE STATUS:  Full code   Diet recommendation:  Low sodium, heart healthy Consultations:  Cardiology  PCCM    Discharge Diagnoses:  Principal Problem:   Atrial fibrillation with RVR ([place]) Active Problems: Acute respiratory faliure- Influenza with respiratory manifestation flu a + 09/14/16 started on tamiflu 09/15/17 Acute on chronic diastolic congestive heart failure (HCC)   Hypokalemia   Essential hypertension   S/P CABG x 2   CKD (chronic kidney disease), stage III (HCC)   Cardiomyopathy, ischemic   Medication noncompliance due to cognitive impairment   Acute drug-induced gout of right foot   Thrombocytopenia ([place])     Brief Summary: [name] [name] an 72 y.o.femaleis a 72yo female admitted to cardiology on 1/22 due to atrial fibrillation with RVR. She treated with Cardizem and has had rate control.  She was scheduled to be discharged on 1/30 with home health and follow up in A fib clinic. she was noted to have  a fever of 101.4 which eventually spiked as high as 103 F with coughing and subsequent confusion and paranoia. [facility] hospital[facility] service was consulted for the above. Given predominant pulmonary issues, TRH  assumed careon2/5   Hospital Course:  Principal Problem:   Atrial fibrillation with RVR (HCC) - HR stable on Cardizem -Continue Xarelto  Active Problems: Influenza A, acute hypoxic respiratory failure possibly secondary to underlying undiagnosed COPD with exacerbation - Patient met sepsis criteria due to fever, tachycardia and tachypnea. - positive influenza A PCR- started on  Tamiflu on 2/1 completed Tamiflu on 2/5. Vancomycin and Zosyn discontinued. -Highly likely patient has undiagnosed COPD due to long-standing history of tobacco use and wheezing, hypoxia   -Will likely need PFTs and outpatient follow-up with pulmonology after discharge.   Mild acute on chronic kidney disease stage III -Currently on Lasix,creatinine stable     Acute metabolic encephalopathy/paranoia in the setting of dementia  -ABGs did not show hypercarbia - suspect underlying dementia and hospital acquitted delirium  Recent acute gout - Currently stable, no acute issues, continue uloric   Chronic thrombocytopenia -No bleeding, monitor counts, slowly has been trending down  Ischemic cardiomyopathy, acute on chronic diastolic CHF, coronary artery disease status post CABG x2, hypertension -BP currently stable, continue diltiazem, metoprolol, Lasix    Discharge Exam: Vitals:   09/24/17 0945 09/24/17 0957  BP: (!) 135/96   Pulse: 97   Resp:    Temp:    SpO2:  97%   Vitals:   09/24/17 0540 09/24/17 0826 09/24/17 0945 09/24/17 0957  BP: 125/87 94/74 (!) 135/96   Pulse:  89 97   Resp: 15 11    Temp: (!) 97.3 F (36.3 C)     TempSrc: Oral     SpO2: 95% 96%  97%  Weight:      Height:        General: Pt is alert, awake, not in acute distress Cardiovascular: RRR, S1/S2 +, no rubs, no gallops Respiratory: rhonchi bilaterally, no wheezing- pulse ox 95% on room air Abdominal: Soft, NT, ND, bowel sounds + Extremities: no edema, no cyanosis   Discharge Instructions  Discharge Instructions    (HEART FAILURE PATIENTS) Call  MD:  Anytime you have any of the following symptoms: 1) 3 pound weight gain in 24 hours or 5 pounds in 1 week 2) shortness of breath, with or without a dry hacking cough 3) swelling in the hands, feet or stomach 4) if you have to sleep on extra pillows at night in order to breathe.   Complete by:  As directed    AMB Referral to [facility] Management   Complete  by:  As directed    Please assign [employer] member to [place] transition of care and [facility] Pharmacist for medication adherence. Has had multiple hospitalizations. History of CAD, CABG, CHF, AFIB, HTN, cognitive impairment. Written consent obtained. Will contact PCP office to make aware [facility] will follow post discharge. Marthenia Rolling, [place], RN,BSN-THN [facility] [phone]   Reason for consult:  Please assign to [place] and [facility] Pharmacist   Diagnoses of:  Heart Failure   Expected date of contact:  1-3 days (reserved for hospital discharges)   Call MD for:  difficulty breathing, headache or visual disturbances   Complete by:  As directed    Call MD for:  severe uncontrolled pain   Complete by:  As directed    Diet - low sodium heart healthy   Complete by:  As directed    Discharge instructions   Complete by:  As directed    If you notice any bleeding such as blood in stool, black tarry stools, blood in urine, nosebleeds or any other unusual bleeding, call your doctor immediately.  It is important that your take your medications as prescribed to keep you out of the hospital and at your home.  Would advise to generally stick to lower sodium diet (aiming for maximum 2,031m per day) and staying hydrated but not to excess >2L per day of total fluid intake.  We have set up for home health to come and visit you after leaving the hospital. Physical therapy, as well as a home health nurse will be available to help you.   We have set up a follow up appointment at our office if you choose to stay with [facility] for your cardiology follow up.   Increase activity slowly   Complete by:  As directed      Allergies as of 09/24/2017      Reactions   Bee Venom Anaphylaxis   Codeine Nausea And Vomiting   Shrimp [shellfish Allergy] Swelling   Tomato    Pt states it gives her gout      Medication List    STOP taking these medications   fluconazole  100 MG tablet Commonly known as:  DIFLUCAN     TAKE these medications   acetaminophen 500 MG tablet Commonly known as:  TYLENOL Take 500 mg by mouth every 6 (six) hours as needed for moderate pain.   atorvastatin 10 MG tablet Commonly known as:  LIPITOR Take 10 mg by mouth daily.   benzonatate 100 MG capsule Commonly known as:  TESSALON PERLES Take 1 capsule (100 mg total) by mouth every 6 (six) hours as needed for cough.   budesonide 0.5 MG/2ML nebulizer solution Commonly known as:  PULMICORT Take 2 mLs (0.5 mg total) by nebulization 2 (two) times daily.   carvedilol 25 MG tablet Commonly known as:  COREG Take 1 tablet (25 mg total) by mouth 2 (two) times daily with a meal.   diltiazem 360 MG 24 hr capsule Commonly known as:  CARDIZEM CD  Take 1 capsule (360 mg total) by mouth daily. What changed:    medication strength  how much to take   febuxostat 40 MG tablet Commonly known as:  [name] Take 40 mg by mouth daily as needed.   fluticasone 50 MCG/ACT nasal spray Commonly known as:  FLONASE Place 1 spray into both nostrils daily. What changed:    when to take this  reasons to take this   furosemide 40 MG tablet Commonly known as:  LASIX Take 60 mg by mouth 2 (two) times daily.   guaiFENesin 600 MG 12 hr tablet Commonly known as:  MUCINEX Take 1 tablet (600 mg total) by mouth 2 (two) times daily.   ketoconazole 2 % cream Commonly known as:  NIZORAL Apply 1 application topically 2 (two) times daily.   levalbuterol 0.63 MG/3ML nebulizer solution Commonly known as:  XOPENEX Take 3 mLs (0.63 mg total) by nebulization every 6 (six) hours as needed for wheezing or shortness of breath.   magnesium oxide 400 (241.3 Mg) MG tablet Commonly known as:  MAG-OX Take 1 tablet (400 mg total) by mouth 3 (three) times daily. What changed:  when to take this   metoprolol succinate 50 MG 24 hr tablet Commonly known as:  TOPROL-XL Take 1 tablet (50 mg total) by mouth 2  (two) times daily. Take with or immediately following a meal.   pantoprazole 40 MG tablet Commonly known as:  PROTONIX Take 1 tablet (40 mg total) by mouth daily at 12 noon.   potassium chloride SA 20 MEQ tablet Commonly known as:  K-DUR,KLOR-CON Take 2 tablets (40 mEq total) by mouth 2 (two) times daily.   predniSONE 10 MG tablet Commonly known as:  DELTASONE 60 mg on 2/9. Then taper by 10 mg daily until finished.   ranitidine 150 MG capsule Commonly known as:  ZANTAC Take 150 mg by mouth daily as needed for heartburn.   rivaroxaban 20 MG Tabs tablet Commonly known as:  XARELTO Take 1 tablet (20 mg total) by mouth daily with supper.   tiZANidine 2 MG tablet Commonly known as:  ZANAFLEX Take 2 mg by mouth daily as needed for muscle spasms.            Durable Medical Equipment  (From admission, onward)        Start     Ordered   09/13/17 1225  For home use only DME Walker rolling  Once    Question:  Patient needs a walker to treat with the following condition  Answer:  Gait instability   09/13/17 1225      Contact information for follow-up providers    Lendon Colonel, NP Follow up.   Specialties:  Nurse Practitioner, Radiology, Cardiology Why:  [place] location - 09/21/17 at [place] by 7:45am to check in. Curt Bears is one of our nurse practitioners that you have met before. If you are not seen in [facility] within 1 week, you should keep this appointment. Contact information: [address] [place] [address] [place], [facility] Follow up.   Specialty:  [place] Why:  HHRN/PT arranged- they will call you to set up home visits Contact information: [address] [place] [address] [phone]        Magdalen Spatz, NP Follow up on 09/25/2017.   Specialty:  Pulmonary Disease Why:  Appt at 10:15 AM for hospital follow up  Contact information: [address]  [place] [address] [phone]            Contact information for after-discharge care    Destination    [facility] .   Service:  Skilled Nursing Contact information: [place] [address] [phone]                 Allergies  Allergen Reactions  . Bee Venom Anaphylaxis  . Codeine Nausea And Vomiting  . Shrimp [Shellfish Allergy] Swelling  . Tomato     Pt states it gives her gout     Procedures/Studies:    Dg Chest 2 View  Result Date: 09/14/2017 CLINICAL DATA:  Fever, cough, and lethargy. EXAM: CHEST  2 VIEW COMPARISON:  09/05/2017 FINDINGS: The patient is mildly rotated to the right. The cardiac silhouette remains enlarged. Sequelae of CABG and left atrial appendage clipping are again identified. Pulmonary vascular congestion and diffuse interstitial lung opacities have improved. Mild residual diffuse interstitial prominence is at least partly chronic. No focal airspace consolidation is identified. There is mild scarring in the left mid to lower lung. No sizable pleural effusion or pneumothorax is identified. No acute osseous abnormality is seen. IMPRESSION: Cardiomegaly with improved pulmonary vascular congestion and interstitial edema. Electronically Signed   By: Logan Bores M.D.   On: 09/14/2017 09:14   Dg Chest Port 1 View  Result Date: 09/21/2017 CLINICAL DATA:  Tachycardia, shortness of breath, and wheezing. EXAM: PORTABLE CHEST 1 VIEW COMPARISON:  Chest x-ray dated September 19, 2017. FINDINGS: Stable cardiomegaly status post CABG and left atrial appendage clipping. Mild vascular congestion is unchanged. Stable lingular atelectasis/scarring. No consolidation, pleural effusion, or pneumothorax. No acute osseous abnormality. IMPRESSION: Stable cardiomegaly and vascular congestion. Electronically Signed   By: Titus Dubin M.D.   On: 09/21/2017 10:46   Dg Chest Port 1 View  Result Date: 09/19/2017 CLINICAL DATA:   Respiratory failure, shortness of breath, wheezing EXAM: PORTABLE CHEST 1 VIEW COMPARISON:  09/17/2017 FINDINGS: Cardiomegaly. Prior CABG. Mild pulmonary vascular congestion. Lingular atelectasis. No confluent opacities otherwise. No effusions or overt edema. No pneumothorax. No acute bony abnormality. IMPRESSION: Cardiomegaly with mild vascular congestion. Lingular atelectasis. No change. Electronically Signed   By: Rolm Baptise M.D.   On: 09/19/2017 10:19   Dg Chest Port 1 View  Result Date: 09/17/2017 CLINICAL DATA:  Dyspnea. EXAM: PORTABLE CHEST 1 VIEW COMPARISON:  09/14/2017 FINDINGS: Stable postsurgical changes from CABG. Stable appearance of left atrial appendage clamp. Enlarged cardiac silhouette. Calcific atherosclerotic disease and tortuosity of the aorta. Mild pulmonary vascular congestion. Stable left mid lung field scarring versus atelectasis. Osseous structures are without acute abnormality. Soft tissues are grossly normal. IMPRESSION: Persistently enlarged cardiac silhouette may represent cardiomyopathy versus pericardial effusion. Mild pulmonary vascular congestion. No evidence of pneumothorax. Electronically Signed   By: Fidela Salisbury M.D.   On: 09/17/2017 11:19   Dg Chest Portable 1 View  Result Date: 09/05/2017 CLINICAL DATA:  Short of breath for 3 days EXAM: PORTABLE CHEST 1 VIEW COMPARISON:  08/04/2017 FINDINGS: Sternotomy wires overlie stable cardiac silhouette. Bibasilar atelectasis. Mild central venous congestion. Mild peripheral interstitial edema. No focal consolidation. No pneumothorax. IMPRESSION: 1. Mild increase in interstitial edema pattern. 2. Mild bibasilar atelectasis. Electronically Signed   By: Suzy Bouchard M.D.   On: 09/05/2017 09:09     The results of significant diagnostics from this hospitalization (including imaging, microbiology, ancillary and laboratory) are listed below for reference.  Microbiology: No results found for this or any previous visit  (from the past 240 hour(s)).   Labs: BNP (last 3 results) Recent Labs    07/28/17 0930 08/04/17 1146 09/16/17 0142  BNP 373.7* 697.5* 878.6*   Basic Metabolic Panel: Recent Labs  Lab 09/18/17 0431 09/21/17 1404 09/22/17 0313 09/23/17 1711  NA 140 142 140 140  K 3.9 3.0* 3.2* 3.5  CL 107 105 104 104  CO2 '22 23 22 23  ' GLUCOSE 152* 169* 215* 219*  BUN 22* 28* 26* 30*  CREATININE 1.23* 1.44* 1.27* 1.40*  CALCIUM 9.4 8.8* 9.1 9.2  MG  --   --   --  2.2   Liver Function Tests: No results for input(s): AST, ALT, ALKPHOS, BILITOT, PROT, ALBUMIN in the last 168 hours. No results for input(s): LIPASE, AMYLASE in the last 168 hours. No results for input(s): AMMONIA in the last 168 hours. CBC: Recent Labs  Lab 09/18/17 0431 09/21/17 1404 09/22/17 0313  WBC 11.8* 11.1* 9.6  NEUTROABS  --  8.0* 8.5*  HGB 14.8 15.7* 15.7*  HCT 46.1* 49.9* 48.2*  MCV 86.8 85.2 83.8  PLT 89* 129* 124*   Cardiac Enzymes: No results for input(s): CKTOTAL, CKMB, CKMBINDEX, TROPONINI in the last 168 hours. BNP: Invalid input(s): POCBNP CBG: No results for input(s): GLUCAP in the last 168 hours. D-Dimer Recent Labs    09/21/17 1404  DDIMER 0.36   Hgb A1c No results for input(s): HGBA1C in the last 72 hours. Lipid Profile No results for input(s): CHOL, HDL, LDLCALC, TRIG, CHOLHDL, LDLDIRECT in the last 72 hours. Thyroid function studies No results for input(s): TSH, T4TOTAL, T3FREE, THYROIDAB in the last 72 hours.  Invalid input(s): FREET3 Anemia work up No results for input(s): VITAMINB12, FOLATE, FERRITIN, TIBC, IRON, RETICCTPCT in the last 72 hours. Urinalysis    Component Value Date/Time   COLORURINE YELLOW 09/14/2017 0336   APPEARANCEUR CLEAR 09/14/2017 0336   LABSPEC 1.010 09/14/2017 0336   PHURINE 6.0 09/14/2017 0336   GLUCOSEU NEGATIVE 09/14/2017 0336   HGBUR NEGATIVE 09/14/2017 0336   BILIRUBINUR NEGATIVE 09/14/2017 0336   BILIRUBINUR ng 12/25/2013 1342   KETONESUR  NEGATIVE 09/14/2017 0336   PROTEINUR NEGATIVE 09/14/2017 0336   UROBILINOGEN 0.2 04/11/2017 1402   NITRITE NEGATIVE 09/14/2017 0336   LEUKOCYTESUR NEGATIVE 09/14/2017 0336   Sepsis Labs Invalid input(s): PROCALCITONIN,  WBC,  LACTICIDVEN Microbiology No results found for this or any previous visit (from the past 240 hour(s)).   Time coordinating discharge: Over 30 minutes  SIGNED:   Debbe Odea, MD  [facility] 09/24/2017, 10:20 AM Pager   If 7PM-7AM, please contact night-coverage [email or web address] Password [email or web address]

## 2017-09-23 LAB — BASIC METABOLIC PANEL
Anion gap: 13 (ref 5–15)
BUN: 30 mg/dL — ABNORMAL HIGH (ref 6–20)
CO2: 23 mmol/L (ref 22–32)
Calcium: 9.2 mg/dL (ref 8.9–10.3)
Chloride: 104 mmol/L (ref 101–111)
Creatinine, Ser: 1.4 mg/dL — ABNORMAL HIGH (ref 0.44–1.00)
GFR calc Af Amer: 42 mL/min — ABNORMAL LOW (ref >60)
GFR calc non Af Amer: 37 mL/min — ABNORMAL LOW (ref >60)
Glucose, Bld: 219 mg/dL — ABNORMAL HIGH (ref 65–99)
Potassium: 3.5 mmol/L (ref 3.5–5.1)
Sodium: 140 mmol/L (ref 135–145)

## 2017-09-23 LAB — MAGNESIUM: Magnesium: 2.2 mg/dL (ref 1.7–2.4)

## 2017-09-23 MED ORDER — POTASSIUM CHLORIDE CRYS ER 20 MEQ PO TBCR
40.0000 meq | EXTENDED_RELEASE_TABLET | Freq: Once | ORAL | Status: AC
  Administered 2017-09-23: 40 meq via ORAL
  Filled 2017-09-23: qty 2

## 2017-09-23 NOTE — Progress Notes (Signed)
CSW following patient for support and discharge needs. Patient has bed at [facility] but facility wont be able to take patient until tomorrow morning. MD is aware of patient not discharging today. Patient insurance authorization through [facility] has been started.   Rhea Pink, MSW,  [place]

## 2017-09-23 NOTE — Progress Notes (Signed)
The patient was discharged yesterday but has not left the hospital due to issues with going to the nursing home. The nursing home is willing to take her tomorrow.  Today she has had multiple PVCs and therefore K and Mg levels were checked.   K+ is normal but on the low side- will give a dose of 40 meq today. Cont to follow on telemetry.   Debbe Odea, MD

## 2017-09-23 NOTE — Progress Notes (Signed)
Notified by telemetry of pt having episodes of 3 beat runs of v tach.  A few episodes occurred which self resolved, last episode at 1637. Notified provider Dr. Wynelle Cleveland, who provided a verbal order for a BMET and magnesium level to be drawn.

## 2017-09-24 DIAGNOSIS — R1319 Other dysphagia: Secondary | ICD-10-CM | POA: Diagnosis not present

## 2017-09-24 DIAGNOSIS — G9341 Metabolic encephalopathy: Secondary | ICD-10-CM | POA: Diagnosis not present

## 2017-09-24 DIAGNOSIS — I4891 Unspecified atrial fibrillation: Secondary | ICD-10-CM | POA: Diagnosis not present

## 2017-09-24 DIAGNOSIS — R4182 Altered mental status, unspecified: Secondary | ICD-10-CM | POA: Diagnosis not present

## 2017-09-24 DIAGNOSIS — J449 Chronic obstructive pulmonary disease, unspecified: Secondary | ICD-10-CM | POA: Diagnosis not present

## 2017-09-24 DIAGNOSIS — R41841 Cognitive communication deficit: Secondary | ICD-10-CM | POA: Diagnosis not present

## 2017-09-24 DIAGNOSIS — N183 Chronic kidney disease, stage 3 (moderate): Secondary | ICD-10-CM | POA: Diagnosis not present

## 2017-09-24 DIAGNOSIS — M6281 Muscle weakness (generalized): Secondary | ICD-10-CM | POA: Diagnosis not present

## 2017-09-24 DIAGNOSIS — G9340 Encephalopathy, unspecified: Secondary | ICD-10-CM | POA: Diagnosis not present

## 2017-09-24 DIAGNOSIS — K219 Gastro-esophageal reflux disease without esophagitis: Secondary | ICD-10-CM | POA: Diagnosis not present

## 2017-09-24 DIAGNOSIS — I2510 Atherosclerotic heart disease of native coronary artery without angina pectoris: Secondary | ICD-10-CM | POA: Diagnosis not present

## 2017-09-24 DIAGNOSIS — R0789 Other chest pain: Secondary | ICD-10-CM | POA: Diagnosis not present

## 2017-09-24 DIAGNOSIS — J189 Pneumonia, unspecified organism: Secondary | ICD-10-CM | POA: Diagnosis not present

## 2017-09-24 DIAGNOSIS — I480 Paroxysmal atrial fibrillation: Secondary | ICD-10-CM | POA: Diagnosis not present

## 2017-09-24 DIAGNOSIS — J09X2 Influenza due to identified novel influenza A virus with other respiratory manifestations: Secondary | ICD-10-CM | POA: Diagnosis not present

## 2017-09-24 DIAGNOSIS — F411 Generalized anxiety disorder: Secondary | ICD-10-CM | POA: Diagnosis not present

## 2017-09-24 DIAGNOSIS — I509 Heart failure, unspecified: Secondary | ICD-10-CM | POA: Diagnosis not present

## 2017-09-24 DIAGNOSIS — I482 Chronic atrial fibrillation: Secondary | ICD-10-CM | POA: Diagnosis not present

## 2017-09-24 NOTE — Progress Notes (Signed)
Pt discharged to [facility]. Attempted report x2 to facility, with no response. IV and telemetry box removed. Pt discharged via PTAR and left with all of her belongings.    Grant Fontana BSN, RN

## 2017-09-24 NOTE — Clinical Social Work Placement (Signed)
   CLINICAL SOCIAL WORK PLACEMENT  NOTE  Date:  09/24/2017  Patient Details  Name: [name] [name] MRN: [id number] Date of Birth: Apr 30, 1946  Clinical Social Work is seeking post-discharge placement for this patient at the [place] level of care (*CSW will initial, date and re-position this form in  chart as items are completed):  Yes   Patient/family provided with [place] Work Department's list of facilities offering this level of care within the geographic area requested by the patient (or if unable, by the patient's family).  Yes   Patient/family informed of their freedom to choose among providers that offer the needed level of care, that participate in Medicare, Medicaid or managed care program needed by the patient, have an available bed and are willing to accept the patient.  Yes   Patient/family informed of [place]'s ownership interest in [facility] and [facility], as well as of the fact that they are under no obligation to receive care at these facilities.  PASRR submitted to EDS on 09/22/17     PASRR number received on 09/22/17     Existing PASRR number confirmed on       FL2 transmitted to all facilities in geographic area requested by pt/family on 09/22/17     FL2 transmitted to all facilities within larger geographic area on       Patient informed that his/her managed care company has contracts with or will negotiate with certain facilities, including the following:        Yes   Patient/family informed of bed offers received.  Patient chooses bed at [facility]     Physician recommends and patient chooses bed at      Patient to be transferred to [facility] on 09/24/17.  Patient to be transferred to facility by ptar     Patient family notified on 09/24/17 of transfer.  Name of family member notified:  Laurenashley Viar     PHYSICIAN Please sign FL2     Additional Comment:     _______________________________________________ Jorge Ny, LCSW 09/24/2017, 10:57 AM

## 2017-09-24 NOTE — Progress Notes (Signed)
Patient will discharge to [facility] Anticipated discharge date: 2/10 Family notified: pt dtr Transportation by PTAR- scheduled at [place] signing off.  Jorge Ny, LCSW Clinical Social Worker [phone]

## 2017-09-25 ENCOUNTER — Inpatient Hospital Stay: Payer: PPO | Admitting: Acute Care

## 2017-09-25 ENCOUNTER — Other Ambulatory Visit: Payer: Self-pay | Admitting: *Deleted

## 2017-09-25 DIAGNOSIS — I10 Essential (primary) hypertension: Secondary | ICD-10-CM

## 2017-09-25 NOTE — Consult Note (Signed)
[facility] Care Management follow up.   Chart reviewed. [name] discharged to [facility] SNF on 09/24/17.   Will request for [facility] LCSW to follow up while at [facility]. Patient previously signed up with [place] Management program.  Sent notification to assigned [place] and [facility] Pharmacist of disposition change.   [name] Rolling, MSN-Ed, [name] [name],[name] [name] [facility] Liaison [phone]

## 2017-09-26 ENCOUNTER — Telehealth: Payer: Self-pay | Admitting: Pharmacist

## 2017-09-26 ENCOUNTER — Other Ambulatory Visit: Payer: Self-pay | Admitting: *Deleted

## 2017-09-26 NOTE — Patient Outreach (Addendum)
[facility]) Care Management  09/26/2017  [name] [name] [birth year] [id number]    Case closure due to pt discharged to SNF for rehabilitation. Pt will be followed by Education officer, museum via [facility].  [name], RN Care Management Coordinator [place] Office [phone]

## 2017-09-26 NOTE — Patient Outreach (Signed)
[facility]) Care Management  09/26/2017  TAWONA FILSINGER [birth year] [id number]   72 y.o. year old female referred to [facility] for [place] (Pharmacy SNF outreach)   [place] [phone]) to complete medication review post-discharge. Spoke with patient's nurse who states she will fax a copy of patient's MAR.   Will follow up once fax received.   Carlean Jews, Pharm.D., BCPS PGY2 Ambulatory Care Pharmacy Resident Phone: [phone]

## 2017-09-27 ENCOUNTER — Encounter: Payer: Self-pay | Admitting: *Deleted

## 2017-09-28 ENCOUNTER — Other Ambulatory Visit: Payer: Self-pay | Admitting: Licensed Clinical Social Worker

## 2017-09-28 DIAGNOSIS — K219 Gastro-esophageal reflux disease without esophagitis: Secondary | ICD-10-CM | POA: Diagnosis not present

## 2017-09-28 DIAGNOSIS — R4182 Altered mental status, unspecified: Secondary | ICD-10-CM | POA: Diagnosis not present

## 2017-09-28 DIAGNOSIS — G9340 Encephalopathy, unspecified: Secondary | ICD-10-CM | POA: Diagnosis not present

## 2017-09-28 DIAGNOSIS — I4891 Unspecified atrial fibrillation: Secondary | ICD-10-CM | POA: Diagnosis not present

## 2017-09-28 DIAGNOSIS — I509 Heart failure, unspecified: Secondary | ICD-10-CM | POA: Diagnosis not present

## 2017-09-28 DIAGNOSIS — F411 Generalized anxiety disorder: Secondary | ICD-10-CM | POA: Diagnosis not present

## 2017-09-28 DIAGNOSIS — I2510 Atherosclerotic heart disease of native coronary artery without angina pectoris: Secondary | ICD-10-CM | POA: Diagnosis not present

## 2017-09-28 DIAGNOSIS — J189 Pneumonia, unspecified organism: Secondary | ICD-10-CM | POA: Diagnosis not present

## 2017-09-28 DIAGNOSIS — J449 Chronic obstructive pulmonary disease, unspecified: Secondary | ICD-10-CM | POA: Diagnosis not present

## 2017-09-28 NOTE — Patient Outreach (Addendum)
[facility]) Care Management  [facility] Social Work  09/28/2017  [name] [name] [birth year] [id number]  Encounter Medications:  Outpatient Encounter Medications as of 09/28/2017  Medication Sig  . acetaminophen (TYLENOL) 500 MG tablet Take 500 mg by mouth every 6 (six) hours as needed for moderate pain.   Marland Kitchen atorvastatin (LIPITOR) 10 MG tablet Take 10 mg by mouth daily.  . benzonatate (TESSALON PERLES) 100 MG capsule Take 1 capsule (100 mg total) by mouth every 6 (six) hours as needed for cough.  . budesonide (PULMICORT) 0.5 MG/2ML nebulizer solution Take 2 mLs (0.5 mg total) by nebulization 2 (two) times daily.  . carvedilol (COREG) 25 MG tablet Take 1 tablet (25 mg total) by mouth 2 (two) times daily with a meal.  . diltiazem (CARDIZEM CD) 360 MG 24 hr capsule Take 1 capsule (360 mg total) by mouth daily.  . febuxostat ([name]) 40 MG tablet Take 40 mg by mouth daily as needed.  . fluticasone (FLONASE) 50 MCG/ACT nasal spray Place 1 spray into both nostrils daily. (Patient taking differently: Place 1 spray into both nostrils daily as needed for allergies. )  . furosemide (LASIX) 40 MG tablet Take 60 mg by mouth 2 (two) times daily.   Marland Kitchen guaiFENesin (MUCINEX) 600 MG 12 hr tablet Take 1 tablet (600 mg total) by mouth 2 (two) times daily.  Marland Kitchen ketoconazole (NIZORAL) 2 % cream Apply 1 application topically 2 (two) times daily.  Marland Kitchen levalbuterol (XOPENEX) 0.63 MG/3ML nebulizer solution Take 3 mLs (0.63 mg total) by nebulization every 6 (six) hours as needed for wheezing or shortness of breath.  . magnesium oxide (MAG-OX) 400 (241.3 Mg) MG tablet Take 1 tablet (400 mg total) by mouth 3 (three) times daily.  . metoprolol succinate (TOPROL-XL) 50 MG 24 hr tablet Take 1 tablet (50 mg total) by mouth 2 (two) times daily. Take with or immediately following a meal.  . pantoprazole (PROTONIX) 40 MG tablet Take 1 tablet (40 mg total) by mouth daily at 12 noon.  . potassium chloride SA  (K-DUR,KLOR-CON) 20 MEQ tablet Take 2 tablets (40 mEq total) by mouth 2 (two) times daily.  . predniSONE (DELTASONE) 10 MG tablet 60 mg on 2/9. Then taper by 10 mg daily until finished.  . ranitidine (ZANTAC) 150 MG capsule Take 150 mg by mouth daily as needed for heartburn.   . rivaroxaban (XARELTO) 20 MG TABS tablet Take 1 tablet (20 mg total) by mouth daily with supper.  Marland Kitchen tiZANidine (ZANAFLEX) 2 MG tablet Take 2 mg by mouth daily as needed for muscle spasms.   No facility-administered encounter medications on file as of 09/28/2017.     Functional Status:  In your present state of health, do you have any difficulty performing the following activities: 09/06/2017 07/27/2017  Hearing? N N  Vision? N N  Difficulty concentrating or making decisions? Y N  Walking or climbing stairs? Y N  Dressing or bathing? N N  Doing errands, shopping? Y N  Some recent data might be hidden    Fall/Depression Screening:  No flowsheet data found.  Assessment: CSW received referral from [place]. This CSW is covering [facility] [facility] at this time and will follow patient while she is at the SNF. CSW will make appropriate referrals back to [facility] after SNF discharge. CSW arrived a patient's room and introduced, self, reason for visit and of [name] [name]. Patient already agreeable to [facility]. Consent has been  signed. Patient presented to the ED on 09/05/17 with SOB and chest pain. Patient was admitted with atrial fibrillation and HF and discharged to [facility] as it was the safest plan for patient as she needed more time before transitioning back home. Patient able to provide [place] identifiers. Patient was finishing up PT when CSW arrived to her room. Patient's physical therapist will only be working with patient for today but reports that patient has been walking around the facility a lot today without any medical equipment and without any problems. PT  reports that patient was very motivated during therapy today as well. CSW and patient walked to the activity room and patient danced during their Valentine's Day activity. Patient seems to be ambulating very well. Patient reports that the plan is for her to return home. Patient does not want LTC placement. Patient shares she was very independent before hospitalization. She states that she has a female friend that resides with her but that she often relies on her own self for most things. Patient reports that she does her own grocery shopping, prepares her own meals and drives herself to her own medical appointments. Patient shares that she wishes to remain as independent as possible. Patient reports that her roommate will assist her whenver needed but that she does not ask him for much. Patient denies any current pain or recent falls. Patient admits to "some" depressive symptoms because she gets "lonesome." Patient denies needing mental health resources though. CSW educated patient on available socialization opportunities within the area. Patient admits that she tends to isolate herself at home. Patient reports her monthly income being "around $1,500" per month. Patient does not qualify for Medicaid. Patient shares that she applied for food stamps but ended up declining them because they were only going to give her $10 dollars in food stamps. CSW will follow patient's case and will ensure that she has a safe and stable discharge back home.   CSW unable to meet with SNF social worker at facility. CSW contacted SNF social worker and left a voice message encouraging a return call in order to discuss case and goals.   Plan: CSW will route encounter to PCP. CSW will follow up with SNF within one week and will await for return call from SNF social worker.  Eula Fried, BSW, MSW, [email or web address] Phone: [phone] Fax: [phone]

## 2017-09-29 ENCOUNTER — Other Ambulatory Visit: Payer: Self-pay | Admitting: Licensed Clinical Social Worker

## 2017-09-29 ENCOUNTER — Ambulatory Visit: Payer: Self-pay | Admitting: *Deleted

## 2017-09-29 NOTE — Patient Outreach (Signed)
[place] [facility]) Care Management  09/29/2017  [name] [name] 1946/06/07 [id number]  Assessment- CSW received return call from [facility] SNF. She has no updates on patient other than she is progressing in PT as patient is new. However, SNF SW reports that she should have more information by next week.   Plan-CSW will follow up with SNF within one week.  Eula Fried, BSW, MSW, [email or web address] Phone: [phone] Fax: [phone]

## 2017-10-02 ENCOUNTER — Other Ambulatory Visit: Payer: Self-pay | Admitting: Licensed Clinical Social Worker

## 2017-10-02 NOTE — Patient Outreach (Signed)
[facility]) Care Management  [facility] Social Work  10/02/2017  MATILYNN DACEY 04/12/1946 [id number]   Encounter Medications:  Outpatient Encounter Medications as of 10/02/2017  Medication Sig  . acetaminophen (TYLENOL) 500 MG tablet Take 500 mg by mouth every 6 (six) hours as needed for moderate pain.   Marland Kitchen atorvastatin (LIPITOR) 10 MG tablet Take 10 mg by mouth daily.  . benzonatate (TESSALON PERLES) 100 MG capsule Take 1 capsule (100 mg total) by mouth every 6 (six) hours as needed for cough.  . budesonide (PULMICORT) 0.5 MG/2ML nebulizer solution Take 2 mLs (0.5 mg total) by nebulization 2 (two) times daily.  . carvedilol (COREG) 25 MG tablet Take 1 tablet (25 mg total) by mouth 2 (two) times daily with a meal.  . diltiazem (CARDIZEM CD) 360 MG 24 hr capsule Take 1 capsule (360 mg total) by mouth daily.  . febuxostat ([name]) 40 MG tablet Take 40 mg by mouth daily as needed.  . fluticasone (FLONASE) 50 MCG/ACT nasal spray Place 1 spray into both nostrils daily. (Patient taking differently: Place 1 spray into both nostrils daily as needed for allergies. )  . furosemide (LASIX) 40 MG tablet Take 60 mg by mouth 2 (two) times daily.   Marland Kitchen guaiFENesin (MUCINEX) 600 MG 12 hr tablet Take 1 tablet (600 mg total) by mouth 2 (two) times daily.  Marland Kitchen ketoconazole (NIZORAL) 2 % cream Apply 1 application topically 2 (two) times daily.  Marland Kitchen levalbuterol (XOPENEX) 0.63 MG/3ML nebulizer solution Take 3 mLs (0.63 mg total) by nebulization every 6 (six) hours as needed for wheezing or shortness of breath.  . magnesium oxide (MAG-OX) 400 (241.3 Mg) MG tablet Take 1 tablet (400 mg total) by mouth 3 (three) times daily.  . metoprolol succinate (TOPROL-XL) 50 MG 24 hr tablet Take 1 tablet (50 mg total) by mouth 2 (two) times daily. Take with or immediately following a meal.  . pantoprazole (PROTONIX) 40 MG tablet Take 1 tablet (40 mg total) by mouth daily at 12 noon.  . potassium chloride SA  (K-DUR,KLOR-CON) 20 MEQ tablet Take 2 tablets (40 mEq total) by mouth 2 (two) times daily.  . predniSONE (DELTASONE) 10 MG tablet 60 mg on 2/9. Then taper by 10 mg daily until finished.  . ranitidine (ZANTAC) 150 MG capsule Take 150 mg by mouth daily as needed for heartburn.   . rivaroxaban (XARELTO) 20 MG TABS tablet Take 1 tablet (20 mg total) by mouth daily with supper.  Marland Kitchen tiZANidine (ZANAFLEX) 2 MG tablet Take 2 mg by mouth daily as needed for muscle spasms.   No facility-administered encounter medications on file as of 10/02/2017.     Functional Status:  In your present state of health, do you have any difficulty performing the following activities: 09/06/2017 07/27/2017  Hearing? N N  Vision? N N  Difficulty concentrating or making decisions? Y N  Walking or climbing stairs? Y N  Dressing or bathing? N N  Doing errands, shopping? Y N  Some recent data might be hidden    Fall/Depression Screening:  PHQ 2/9 Scores 09/28/2017  PHQ - 2 Score 2  PHQ- 9 Score 5    Assessment: CSW arrived at [facility] to complete SNF visit with patient. Patient was in her room when CSW arrived. Patient reports that she will be going to PT very soon and cannot talk long. Patient reports that she has been very sore from PT recently. She shares that she was not able to get  up and walking yesterday because she was dealing with some pain underneath her knee caps and her legs were very sore as well. Patient shares that she is sleeping "so-so." She shares that she has no issues with her appetite. Patient reports that she had some friends come by and visit her. She reports to be in touch with her roommate at home and that her roommate is aware that her plan is to return home.  Patient shares that she wants her PCP's number and CSW wrote down this information for patient. Patient states that she wants to go home soon and wishes to discuss this with her PCP. CSW provided education on the benefits of receiving short  term rehab here and patient was receptive but still wishes to return home as soon as possible. PT came and got patient for therapy. CSW unable to finish discussion.   Plan: CSW will follow up with SNF social worker within one week to gain discharge updates.  Eula Fried, BSW, MSW, [email or web address] Phone: [phone] Fax: [phone]

## 2017-10-04 NOTE — Patient Outreach (Addendum)
[facility] [facility]) Care Management  [place]   10/04/2017  [name] [name] 04-21-1946 [id number]  72 y.o. year old female referred to [facility] for Care Coordination (Pharmacy SNF outreach)  PMH s/f: CAD s/p CABG in 2016, ischemic cardiomyopathy with improved EF s/p CABG, persistent atrial fibrillation on Xarelto, s/p left atrial appendage clipping, HTN, CKD III. Noted to have been admitted twice in December of 2018 for atrial fibrillation with rapid ventricular response during which multiple medication adjustments have been made. Per chart review, family members have voiced concerns regarding medication noncompliance.   Patient with [facility] medicare advantage plan.   Called patient's skilled nursing facility to obtain [facility] (rec'd late on 09/27/17)  Objective:   Patient was recently discharged (hospitalized 09/05/17 - 09/13/17) for atrial fibrillation with rapid ventricular response and CHF exacerbation. During this admission, patient was diuresed 9.8L of fluid, treated for flu +/- COPD exacerbation and gout, and ultimately had her diltiazem titrated up to control heart rate. It is noted that patient was considered poor candidate for ablation or antiarrhythmic therapies 2/2 history of non compliance. There was debate about whether patient was to follow up with [facility] or [place] for cardiac care. Prior to discharge, patient arranged to be seen at [facility] for followup on 11/20/17 and to be seen by Eric Form, NP for PFTs on 09/25/17. Patient was originally to be discharged home but then decided to be transferred to SNF for rehabilitation. Follow up with primary care included PFTs.   CrCl using actual body weight (dosed this was in DOAC studies) - 51.6 ml/min 09/23/17 Potassium 3.5 09/23/17   Encounter Medications: Outpatient Encounter Medications as of 09/26/2017  Medication Sig  . acetaminophen (TYLENOL) 500 MG tablet Take 500 mg by mouth every 6 (six) hours as needed for  moderate pain.   Marland Kitchen atorvastatin (LIPITOR) 10 MG tablet Take 10 mg by mouth daily.  . benzonatate (TESSALON PERLES) 100 MG capsule Take 1 capsule (100 mg total) by mouth every 6 (six) hours as needed for cough.  . budesonide (PULMICORT) 0.5 MG/2ML nebulizer solution Take 2 mLs (0.5 mg total) by nebulization 2 (two) times daily.  . carvedilol (COREG) 25 MG tablet Take 1 tablet (25 mg total) by mouth 2 (two) times daily with a meal.  . diltiazem (CARDIZEM CD) 360 MG 24 hr capsule Take 1 capsule (360 mg total) by mouth daily.  . febuxostat ([name]) 40 MG tablet Take 40 mg by mouth daily as needed.  . fluticasone (FLONASE) 50 MCG/ACT nasal spray Place 1 spray into both nostrils daily. (Patient taking differently: Place 1 spray into both nostrils daily as needed for allergies. )  . furosemide (LASIX) 40 MG tablet Take 60 mg by mouth 2 (two) times daily.   Marland Kitchen guaiFENesin (MUCINEX) 600 MG 12 hr tablet Take 1 tablet (600 mg total) by mouth 2 (two) times daily.  Marland Kitchen levalbuterol (XOPENEX) 0.63 MG/3ML nebulizer solution Take 3 mLs (0.63 mg total) by nebulization every 6 (six) hours as needed for wheezing or shortness of breath.  . magnesium oxide (MAG-OX) 400 (241.3 Mg) MG tablet Take 1 tablet (400 mg total) by mouth 3 (three) times daily.  . metoprolol succinate (TOPROL-XL) 50 MG 24 hr tablet Take 1 tablet (50 mg total) by mouth 2 (two) times daily. Take with or immediately following a meal.  . pantoprazole (PROTONIX) 40 MG tablet Take 1 tablet (40 mg total) by mouth daily at 12 noon.  . potassium chloride SA (K-DUR,KLOR-CON)  20 MEQ tablet Take 2 tablets (40 mEq total) by mouth 2 (two) times daily.  . predniSONE (DELTASONE) 10 MG tablet 60 mg on 2/9. Then taper by 10 mg daily until finished.  . ranitidine (ZANTAC) 150 MG capsule Take 150 mg by mouth daily as needed for heartburn.   . rivaroxaban (XARELTO) 20 MG TABS tablet Take 1 tablet (20 mg total) by mouth daily with supper.  Marland Kitchen tiZANidine (ZANAFLEX) 2 MG  tablet Take 2 mg by mouth daily as needed for muscle spasms.  . [DISCONTINUED] ketoconazole (NIZORAL) 2 % cream Apply 1 application topically 2 (two) times daily.   No facility-administered encounter medications on file as of 09/26/2017.     Functional Status: In your present state of health, do you have any difficulty performing the following activities: 09/06/2017 07/27/2017  Hearing? N N  Vision? N N  Difficulty concentrating or making decisions? Y N  Walking or climbing stairs? Y N  Dressing or bathing? N N  Doing errands, shopping? Y N  Some recent data might be hidden    Fall/Depression Screening: Fall Risk  09/28/2017  Falls in the past year? No   PHQ 2/9 Scores 09/28/2017  PHQ - 2 Score 2  PHQ- 9 Score 5    ASSESSMENT: Date Discharged from Hospital: 09/13/17 Date Medication Reconciliation Performed: 10/04/2017  Medications Discontinued at Discharge:   fluconzole  New Medications at Discharge:   Benzonatate 1 cap q6h as needed  Pulmicort nebs 0.5 mg/67mL twice daily  Guaifenesin 600 mg twice daily  Xopenex nebs 0.63 mg every 6 hours as needed for wheezing/shortness of breath  Metoprolol succinate 50 mg twice daily  Pantoprazole 40 mg daily  Prednisone 60 mg daily on 2/9, then taper by 10 mg  Diltiazem 24 hr 360 mg daily (dose increased)  Magnesium oxide 400 mg three times daily (increased frequency)   Patient was recently discharged from hospital and all medications have been reviewed   Drugs sorted by system:  Cardiovascular:atorvastatin, carvedilol, metoprolol succinate, diltiazem, xarelto, furosemide   Pulmonary/Allergy: tessalon perles, pulmicort, xopenex, flonase, guaifenesin,   Gastrointestinal: ranitidine   Endocrine: febuxostat, prednisone taper  Pain: tizanidine, acetaminophen   Vitamins/Minerals: magnesium, potassium  Duplications in therapy: beta blockers - carvedilol and metoprolol. Appears patient was admitted on carvedilol, then  carvedilol was held during admission and replaced with metoprolol. Upon discharge carvedilol was restarted and metoprolol was also on the discharge instructions. Patient receiving both at skilled nursing facility per [facility] faxed to me.  Gaps in therapy: aspirin with CAD however patient also on xarelto, aspirin stopped at previous discharge Drug interactions: none clinically significanat Other issues noted: Febuxostat PRN - increased risk for acute gout flares when taking PRN without acute therapies of either steroids, NSAIDs, or colchicine. Additionally, last lipid panel 2016 - unclear if atorvastatin 10 mg daily is efficacious or not. Patient's LDL goal likely <70 mg/dL given history.    PLAN: -Called patient's provider (Dr. [name]) from [facility] skilled nursing facility at [phone] - number provided by nursing staff. No answer. Left HIPAA-compliant message requesting he return my call. Issues: Therapeutic duplication of beta blockers and PRN use of febuxostat.  -Ensured patient was taking other medications as prescribed per MAR.  -Will route this note to Dr. Debara Pickett as well.  -Will follow up with Dr. Lorenda Hatchet within two business days if I have not heard back from him.   Carlean Jews, Pharm.D., BCPS PGY2 Ambulatory Care Pharmacy Resident Phone: [phone]

## 2017-10-04 NOTE — Patient Outreach (Signed)
[name]) Care Management  10/04/2017  [name] [name] 04/11/1946 [id number]   Received call from Dr. Lysle Rubens. Discussed case with him. He gives verbal orders to discontinue carvedilol, continue metoprolol succinate 50 mg BID, schedule febuxostat 40 mg daily. Will hold off on concomitant NSAID, prednisone or colchicine for now as patient has potentially been taking this prior to admission on a daily basis. Last uric acid level = 10.5 on 09/09/17. He asks me to call the nurse at [facility] to communicate these changes. [place] and gave these verbal instructions to nursing staff.   Will close case. Please reconsult as needed when patient is discharged from skilled nursing facility.   Carlean Jews, Pharm.D., BCPS PGY2 Ambulatory Care Pharmacy Resident Phone: [phone]

## 2017-10-04 NOTE — Addendum Note (Signed)
Addended by: Deirdre Pippins E on: 10/04/2017 04:50 PM   Modules accepted: Orders

## 2017-10-06 ENCOUNTER — Other Ambulatory Visit: Payer: Self-pay | Admitting: Licensed Clinical Social Worker

## 2017-10-06 NOTE — Patient Outreach (Signed)
[facility]) Care Management  10/06/2017  [name] [name] 18-Apr-1946 [id number]  Assessment- CSW completed call to [facility] but was unsuccessful in reaching SNF social worker [name]. CSW left a voice message encouraging a return call with discharge updates.  Plan-CSW will await for return call from SNF social worker and will continue to follow patient until SNF discharge.  Eula Fried, BSW, MSW, [name] Phone: [phone] Fax: [phone]

## 2017-10-12 ENCOUNTER — Other Ambulatory Visit: Payer: Self-pay | Admitting: Licensed Clinical Social Worker

## 2017-10-12 NOTE — Patient Outreach (Signed)
[facility]) Care Management  10/12/2017  LILLIANNE EICK 08-Mar-1946 [id number]  Assessment- CSW coordinated with [place]. [facility] CSW reported that she spoke with patient today and she reports no social work needs at present. Home health services have been arranged, she just purchased all her prescription medications (over $200 worth), she has all the durable medical equipment she needs and is able to transport herself to and from her physician appointments. No social work needs at this time.   Plan-CSW will close social work program at this time.  Eula Fried, BSW, MSW, [email or web address] Phone: [phone] Fax: [phone]

## 2017-10-12 NOTE — Patient Outreach (Signed)
[facility]) Care Management  10/12/2017  [name] [name] 1945-11-29 [id number]  Assessment- CSW completed call to [facility] and confirmed with front desk that patient has discharged. CSW was transferred to SNF social worker but was unable to reach her. HIPPA compliant voice message left encouraging a return call with discharge updates. CSW will make referrals for [facility] RNCM and CSW at this time to follow up with patient in the community.  Plan-CSW will refer back to community at this time now that patient has discharged from SNF.  Eula Fried, BSW, MSW, [email or web address] Phone: [phone] Fax: [phone]

## 2017-10-13 ENCOUNTER — Other Ambulatory Visit: Payer: Self-pay | Admitting: *Deleted

## 2017-10-13 NOTE — Patient Outreach (Signed)
[place] [facility]) Care Management  10/13/2017  [name] [name] 1946/02/12 [id number]   Referral received from LCSW as member was recently discharged from rehab facility.  Admitted to hospital on 1/22 with A-fib and heart failure, discharged to rehab on 2/10.  Per chart, she also has history of hypertension, cardiomyopathy, and chronic kidney disease.    Call placed to member, identity verified.  This care manager introduced self and purpose of call.  [facility] care management services explained.  She reports she is doing well, denies the need for additional services.  State she has all medications, report compliance.  Also state she is active with home health.  Difference between home health and [facility] explained, she again declines services.  Advised to contact [facility] if needs change.  She verbalizes understanding.  Will notify primary MD and care management assistant of case closure.  [name], [place], MSN [place] [phone]

## 2017-10-15 ENCOUNTER — Emergency Department (HOSPITAL_COMMUNITY): Payer: PPO

## 2017-10-15 ENCOUNTER — Inpatient Hospital Stay (HOSPITAL_COMMUNITY)
Admission: EM | Admit: 2017-10-15 | Discharge: 2017-10-20 | DRG: 291 | Disposition: A | Discharge status: Home Health | Payer: PPO | Attending: Internal Medicine | Admitting: Internal Medicine

## 2017-10-15 ENCOUNTER — Encounter (HOSPITAL_COMMUNITY): Payer: Self-pay

## 2017-10-15 DIAGNOSIS — I255 Ischemic cardiomyopathy: Secondary | ICD-10-CM | POA: Diagnosis not present

## 2017-10-15 DIAGNOSIS — Z7901 Long term (current) use of anticoagulants: Secondary | ICD-10-CM

## 2017-10-15 DIAGNOSIS — I4891 Unspecified atrial fibrillation: Secondary | ICD-10-CM

## 2017-10-15 DIAGNOSIS — Z91013 Allergy to seafood: Secondary | ICD-10-CM

## 2017-10-15 DIAGNOSIS — I2583 Coronary atherosclerosis due to lipid rich plaque: Secondary | ICD-10-CM | POA: Diagnosis present

## 2017-10-15 DIAGNOSIS — E875 Hyperkalemia: Secondary | ICD-10-CM | POA: Diagnosis present

## 2017-10-15 DIAGNOSIS — R413 Other amnesia: Secondary | ICD-10-CM | POA: Diagnosis present

## 2017-10-15 DIAGNOSIS — I481 Persistent atrial fibrillation: Secondary | ICD-10-CM | POA: Diagnosis not present

## 2017-10-15 DIAGNOSIS — R000 Tachycardia, unspecified: Secondary | ICD-10-CM | POA: Diagnosis not present

## 2017-10-15 DIAGNOSIS — I272 Pulmonary hypertension, unspecified: Secondary | ICD-10-CM | POA: Diagnosis not present

## 2017-10-15 DIAGNOSIS — I351 Nonrheumatic aortic (valve) insufficiency: Secondary | ICD-10-CM | POA: Diagnosis not present

## 2017-10-15 DIAGNOSIS — Z91199 Patient's noncompliance with other medical treatment and regimen due to unspecified reason: Secondary | ICD-10-CM

## 2017-10-15 DIAGNOSIS — Z91030 Bee allergy status: Secondary | ICD-10-CM

## 2017-10-15 DIAGNOSIS — R197 Diarrhea, unspecified: Secondary | ICD-10-CM | POA: Diagnosis not present

## 2017-10-15 DIAGNOSIS — M109 Gout, unspecified: Secondary | ICD-10-CM | POA: Diagnosis present

## 2017-10-15 DIAGNOSIS — N390 Urinary tract infection, site not specified: Secondary | ICD-10-CM | POA: Diagnosis present

## 2017-10-15 DIAGNOSIS — Z9119 Patient's noncompliance with other medical treatment and regimen: Secondary | ICD-10-CM | POA: Diagnosis not present

## 2017-10-15 DIAGNOSIS — I5043 Acute on chronic combined systolic (congestive) and diastolic (congestive) heart failure: Secondary | ICD-10-CM | POA: Diagnosis not present

## 2017-10-15 DIAGNOSIS — Z885 Allergy status to narcotic agent status: Secondary | ICD-10-CM | POA: Diagnosis not present

## 2017-10-15 DIAGNOSIS — B9620 Unspecified Escherichia coli [E. coli] as the cause of diseases classified elsewhere: Secondary | ICD-10-CM | POA: Diagnosis not present

## 2017-10-15 DIAGNOSIS — R32 Unspecified urinary incontinence: Secondary | ICD-10-CM | POA: Diagnosis present

## 2017-10-15 DIAGNOSIS — Z951 Presence of aortocoronary bypass graft: Secondary | ICD-10-CM | POA: Diagnosis not present

## 2017-10-15 DIAGNOSIS — I130 Hypertensive heart and chronic kidney disease with heart failure and stage 1 through stage 4 chronic kidney disease, or unspecified chronic kidney disease: Principal | ICD-10-CM | POA: Diagnosis present

## 2017-10-15 DIAGNOSIS — M25561 Pain in right knee: Secondary | ICD-10-CM

## 2017-10-15 DIAGNOSIS — N182 Chronic kidney disease, stage 2 (mild): Secondary | ICD-10-CM | POA: Diagnosis not present

## 2017-10-15 DIAGNOSIS — J9601 Acute respiratory failure with hypoxia: Secondary | ICD-10-CM | POA: Diagnosis present

## 2017-10-15 DIAGNOSIS — I10 Essential (primary) hypertension: Secondary | ICD-10-CM | POA: Diagnosis not present

## 2017-10-15 DIAGNOSIS — I110 Hypertensive heart disease with heart failure: Secondary | ICD-10-CM | POA: Diagnosis not present

## 2017-10-15 DIAGNOSIS — R0602 Shortness of breath: Secondary | ICD-10-CM | POA: Diagnosis not present

## 2017-10-15 DIAGNOSIS — I2510 Atherosclerotic heart disease of native coronary artery without angina pectoris: Secondary | ICD-10-CM | POA: Diagnosis not present

## 2017-10-15 DIAGNOSIS — Z8249 Family history of ischemic heart disease and other diseases of the circulatory system: Secondary | ICD-10-CM | POA: Diagnosis not present

## 2017-10-15 DIAGNOSIS — I509 Heart failure, unspecified: Secondary | ICD-10-CM | POA: Diagnosis not present

## 2017-10-15 DIAGNOSIS — E876 Hypokalemia: Secondary | ICD-10-CM | POA: Diagnosis not present

## 2017-10-15 DIAGNOSIS — N183 Chronic kidney disease, stage 3 unspecified: Secondary | ICD-10-CM | POA: Diagnosis present

## 2017-10-15 DIAGNOSIS — B961 Klebsiella pneumoniae [K. pneumoniae] as the cause of diseases classified elsewhere: Secondary | ICD-10-CM | POA: Diagnosis present

## 2017-10-15 MED ORDER — DILTIAZEM HCL 60 MG PO TABS
60.0000 mg | ORAL_TABLET | Freq: Once | ORAL | Status: AC
  Administered 2017-10-16: 60 mg via ORAL
  Filled 2017-10-15: qty 1

## 2017-10-15 NOTE — ED Triage Notes (Signed)
Pt arrived from home with complains of a fib and shortness of breath. Pt states she was recently discharged from the hospital and drank a boost which gave her diarrhea.

## 2017-10-15 NOTE — ED Provider Notes (Signed)
[facility] DEPT Provider Note   CSN: [id number] Arrival date & time: 10/15/17  2212     History   Chief Complaint No chief complaint on file.   HPI [name] [name] is a 73 y.o. female.  Patient presents to the emergency department for evaluation of atrial fibrillation.  Patient reports that she can feel that her heart rate is elevated and it caused her to feel short of breath.  She denies chest pain.  She also developed diarrhea after drinking a boost shake earlier today.  She is not experiencing abdominal pain.  She is a poor historian.      Past Medical History:  Diagnosis Date  . Acute respiratory failure ([place]) 08/2017  . Arthritis   . Asthma    ??  . CAD (coronary artery disease)    a. s/p CABG 2016.  . Cardiomyopathy, ischemic    a. EF previously low, improved to LVEF 50-55% as of May 2018  . Chronic combined systolic and diastolic heart failure ([place])   . CKD (chronic kidney disease), stage III ([place])   . Gout   . Hypertension   . Hypokalemia   . LGI bleed 08/06/2017   a. felt to be hemorrhoidal during that admission (no drop in Hgb).  . Persistent atrial fibrillation (HCC)    a. h/o difficult to control rates (complicated by noncompliance), not felt to be a candidate for ablation or antiarrhythmic due to noncompliance.  . Personal history of noncompliance with medical treatment, presenting hazards to health   . S/P CABG x 2 with clipping of LA appendage 11/14/2014   LIMA to LAD, SVG to OM, EVH via right thigh    Patient Active Problem List   Diagnosis Date Noted  . Acute on chronic congestive heart failure ([place])   . Influenza with respiratory manifestation flu a + 09/14/16 started on tamiflu 09/15/17 09/19/2017  . Thrombocytopenia ([place]) 09/14/2017  . Acute drug-induced gout of right foot   . Medication noncompliance due to cognitive impairment 09/06/2017  . Acute diastolic CHF (congestive heart failure) ([place]) 08/06/2017  . LGI bleed,  likely hemorrhoids 08/06/2017  . Atrial fibrillation ([place]) 08/04/2017  . Paroxysmal atrial fibrillation ([place]) 02/08/2017  . Cardiomyopathy, ischemic 02/08/2017  . Dysphagia 02/08/2017  . CKD (chronic kidney disease), stage III ([place]) 01/30/2017  . Pain in shoulder 02/08/2016  . Breast pain, left 02/08/2016  . Bilateral arm numbness and tingling while sleeping 02/08/2016  . Painful lumpy left breast 09/23/2015  . [place] intertrigo 02/11/2015  . S/P CABG x 2 11/14/2014  . Accelerated hypertension   . Cardiac arrest ([place]) 11/04/2014  . Left main coronary artery disease 11/04/2014  . Coronary artery disease due to lipid rich plaque   . Acute respiratory failure with hypoxemia ([place])   . Essential hypertension   . Atrial fibrillation with RVR ([place])   . Hypokalemia 10/30/2014  . Chronic diastolic CHF (congestive heart failure) ([place]) 10/30/2014  . NSTEMI (non-ST elevated myocardial infarction) ([name])   . DOE (dyspnea on exertion)   . CAP (community acquired pneumonia) 10/29/2014    Past Surgical History:  Procedure Laterality Date  . CARDIOVERSION N/A 11/18/2014   Procedure: CARDIOVERSION;  Surgeon: [name], MD;  Location: [facility];  Service: Cardiovascular;  Laterality: N/A;  . CLIPPING OF ATRIAL APPENDAGE N/A 11/14/2014   Procedure: CLIPPING OF ATRIAL APPENDAGE;  Surgeon: [name], MD;  Location: [facility];  Service: Open Heart Surgery;  Laterality: N/A;  . CORONARY  ARTERY BYPASS GRAFT N/A 11/14/2014   Procedure: CORONARY ARTERY BYPASS GRAFTING (CABG)TIMES 2 USING LEFT INTERNAL MAMMARY ARTERY AND RIGHT SAPHENOUS VEIN HARVESTED ENDOSCOPICALLY;  Surgeon: [name], MD;  Location: [facility];  Service: Open Heart Surgery;  Laterality: N/A;  . LEFT HEART CATHETERIZATION WITH CORONARY ANGIOGRAM N/A 11/04/2014   Procedure: LEFT HEART CATHETERIZATION WITH CORONARY ANGIOGRAM;  Surgeon: Troy Sine, MD;  Location[facility];  Service: Cardiovascular;  Laterality: N/A;  . TEE WITHOUT  CARDIOVERSION N/A 11/14/2014   Procedure: TRANSESOPHAGEAL ECHOCARDIOGRAM (TEE);  Surgeon: [name], MD;  Location: [facility];  Service: Open Heart Surgery;  Laterality: N/A;  . TEMPORARY PACEMAKER INSERTION  11/04/2014   Procedure: TEMPORARY PACEMAKER INSERTION;  Surgeon: Troy Sine, MD;  Location: [facility] CATH LAB;  Service: Cardiovascular;;    OB History    No data available       Home Medications    Prior to Admission medications   Medication Sig Start Date End Date Taking? Authorizing Provider  acetaminophen (TYLENOL) 500 MG tablet Take 500 mg by mouth every 6 (six) hours as needed for moderate pain.    Yes [provider]  atorvastatin (LIPITOR) 10 MG tablet Take 10 mg by mouth daily.   Yes [provider]  benzonatate (TESSALON PERLES) 100 MG capsule Take 1 capsule (100 mg total) by mouth every 6 (six) hours as needed for cough. 09/22/17 09/22/18 Yes Rizwan, Eunice Blase, MD  budesonide (PULMICORT) 0.5 MG/2ML nebulizer solution Take 2 mLs (0.5 mg total) by nebulization 2 (two) times daily. 09/22/17  Yes Debbe Odea, MD  carvedilol (COREG) 25 MG tablet Take 1 tablet (25 mg total) by mouth 2 (two) times daily with a meal. 08/02/17  Yes Sherran Needs, NP  diltiazem (CARDIZEM CD) 360 MG 24 hr capsule Take 1 capsule (360 mg total) by mouth daily. 09/13/17  Yes Kathyrn Drown D, NP  febuxostat ([name]) 40 MG tablet Take 40 mg by mouth daily.    Yes [provider]  fluticasone (FLONASE) 50 MCG/ACT nasal spray Place 1 spray into both nostrils daily. Patient taking differently: Place 1 spray into both nostrils daily as needed for allergies.  07/30/17  Yes Lendon Colonel, NP  furosemide (LASIX) 40 MG tablet Take 60 mg by mouth 2 (two) times daily.  08/14/17  Yes Hilty, Nadean Corwin, MD  guaiFENesin (MUCINEX) 600 MG 12 hr tablet Take 1 tablet (600 mg total) by mouth 2 (two) times daily. 09/22/17 09/22/18 Yes Debbe Odea, MD  levalbuterol (XOPENEX) 0.63 MG/3ML nebulizer  solution Take 3 mLs (0.63 mg total) by nebulization every 6 (six) hours as needed for wheezing or shortness of breath. 09/22/17  Yes Debbe Odea, MD  magnesium oxide (MAG-OX) 400 (241.3 Mg) MG tablet Take 1 tablet (400 mg total) by mouth 3 (three) times daily. 09/13/17  Yes Kathyrn Drown D, NP  metoprolol succinate (TOPROL-XL) 50 MG 24 hr tablet Take 1 tablet (50 mg total) by mouth 2 (two) times daily. Take with or immediately following a meal. 09/22/17  Yes Rizwan, Eunice Blase, MD  pantoprazole (PROTONIX) 40 MG tablet Take 1 tablet (40 mg total) by mouth daily at 12 noon. 09/22/17  Yes Debbe Odea, MD  potassium chloride SA (K-DUR,KLOR-CON) 20 MEQ tablet Take 2 tablets (40 mEq total) by mouth 2 (two) times daily. 07/29/17  Yes Lendon Colonel, NP  ranitidine (ZANTAC) 150 MG capsule Take 150 mg by mouth 2 (two) times daily.    Yes [provider]  rivaroxaban (XARELTO) 20 MG TABS tablet Take 1 tablet (20 mg total) by mouth daily with supper. 07/29/17  Yes Lendon Colonel, NP  tiZANidine (ZANAFLEX) 2 MG tablet Take 2 mg by mouth daily as needed for muscle spasms. 08/25/17  Yes [provider]  predniSONE (DELTASONE) 10 MG tablet 60 mg on 2/9. Then taper by 10 mg daily until finished. Patient not taking: Reported on 10/16/2017 09/22/17   Debbe Odea, MD    Family History Family History  Problem Relation Age of Onset  . Cancer Mother   . Heart disease Father     Social History Social History   Tobacco Use  . Smoking status: Current Some Day Smoker    Packs/day: 0.10    Years: 56.00    Pack years: 5.60    Types: Cigarettes  . Smokeless tobacco: Never Used  Substance Use Topics  . Alcohol use: No    Alcohol/week: 0.0 oz  . Drug use: No     Allergies   Bee venom; Codeine; Shrimp [shellfish allergy]; and Tomato   Review of Systems Review of Systems  Respiratory: Positive for shortness of breath.   Cardiovascular: Positive for palpitations.  Gastrointestinal:  Positive for diarrhea.  All other systems reviewed and are negative.    Physical Exam Updated Vital Signs BP (!) 145/92   Pulse 99   Temp 98.5 F (36.9 C) (Oral)   Resp (!) 42   Ht 5' 7.5" (1.715 m)   Wt 90.7 kg (200 lb)   SpO2 94%   BMI 30.86 kg/m   Physical Exam  Constitutional: She is oriented to person, place, and time. She appears well-developed and well-nourished. No distress.  HENT:  Head: Normocephalic and atraumatic.  Right Ear: Hearing normal.  Left Ear: Hearing normal.  Nose: Nose normal.  Mouth/Throat: Oropharynx is clear and moist and mucous membranes are normal.  Eyes: Conjunctivae and EOM are normal. Pupils are equal, round, and reactive to light.  Neck: Normal range of motion. Neck supple.  Cardiovascular: S1 normal and S2 normal. An irregularly irregular rhythm present. Tachycardia present. Exam reveals no gallop and no friction rub.  No murmur heard. Pulmonary/Chest: Effort normal and breath sounds normal. No respiratory distress. She exhibits no tenderness.  Abdominal: Soft. Normal appearance and bowel sounds are normal. There is no hepatosplenomegaly. There is no tenderness. There is no rebound, no guarding, no tenderness at McBurney's point and negative Murphy's sign. No hernia.  Musculoskeletal: Normal range of motion.  Neurological: She is alert and oriented to person, place, and time. She has normal strength. No cranial nerve deficit or sensory deficit. Coordination normal. GCS eye subscore is 4. GCS verbal subscore is 5. GCS motor subscore is 6.  Skin: Skin is warm, dry and intact. No rash noted. No cyanosis.  Psychiatric: She has a normal mood and affect. Her speech is normal and behavior is normal. Thought content normal.  Nursing note and vitals reviewed.    ED Treatments / Results  Labs (all labs ordered are listed, but only abnormal results are displayed) Labs Reviewed  CBC WITH DIFFERENTIAL/PLATELET - Abnormal; Notable for the following  components:      Result Value   RBC 5.25 (*)    RDW 18.3 (*)    All other components within normal limits  BASIC METABOLIC PANEL - Abnormal; Notable for the following components:   Potassium 6.6 (*)    Chloride 117 (*)    CO2 19 (*)    Glucose, Bld 137 (*)  Creatinine, Ser 1.10 (*)    GFR calc non Af Amer 49 (*)    GFR calc Af Amer 57 (*)    All other components within normal limits  BRAIN NATRIURETIC PEPTIDE - Abnormal; Notable for the following components:   B Natriuretic Peptide 699.6 (*)    All other components within normal limits  POTASSIUM - Abnormal; Notable for the following components:   Potassium 5.3 (*)    All other components within normal limits  URINALYSIS, ROUTINE W REFLEX MICROSCOPIC - Abnormal; Notable for the following components:   Color, Urine AMBER (*)    APPearance CLOUDY (*)    Protein, ur 30 (*)    Leukocytes, UA SMALL (*)    Bacteria, UA RARE (*)    Squamous Epithelial / LPF 6-30 (*)    All other components within normal limits  URINE CULTURE  I-STAT TROPONIN, ED    EKG  EKG Interpretation None       Radiology Dg Chest Port 1 View  Result Date: 10/16/2017 CLINICAL DATA:  Shortness of breath. EXAM: PORTABLE CHEST 1 VIEW COMPARISON:  Radiograph 09/21/2017, CT 07/22/2017 FINDINGS: Post median sternotomy and clipping of the left atrial appendage. Cardiomegaly is unchanged from prior exam. Unchanged mediastinal contours. Unchanged vascular congestion from prior exam. Linear scarring in the left lower lung zone. No confluent consolidation. No large pleural effusion. No pneumothorax. IMPRESSION: Unchanged cardiomegaly and vascular congestion. Electronically Signed   By: Jeb Levering M.D.   On: 10/16/2017 00:10    Procedures Procedures (including critical care time)  Medications Ordered in ED Medications  diltiazem (CARDIZEM) tablet 60 mg (60 mg Oral Given 10/16/17 0016)  furosemide (LASIX) injection 60 mg (60 mg Intravenous Given 10/16/17 0459)       Initial Impression / Assessment and Plan / ED Course  I have reviewed the triage vital signs and the nursing notes.  Pertinent labs & imaging results that were available during my care of the patient were reviewed by me and considered in my medical decision making (see chart for details).     Patient presented to the ER stating that she was in atrial fibrillation.  She reported that she was experiencing heart palpitations and felt short of breath.  She stated that this is what happens with her A. fib.  Reviewing her records, however, revealed that she is in chronic A. fib.  She has been noncompliant with medications.  She was recently seen in cardiology and had a heart rate of 120, felt to be secondary to her not taking her Cardizem.  She was given p.o. Cardizem here in the ER and her heart rate significantly improved, now in the 90s, still in A. Fib.  Patient reports an episode of diarrhea tonight.  She is not expensing abdominal pain.  Abdominal exam is benign.  Blood work did reveal hyperkalemia.  Initially there was a large amount of hemolysis, second value showed that her potassium was 5.3, unclear if there was hemolysis in this value.  She does not have a reason to have significant hyperkalemia as she is on Lasix and not in renal failure.    Initially it was thought that the shortness of breath might simply be secondary to her increased heart rate.  As her heart rate came down with Cardizem, her tachypnea did not improve.  She does appear to be slightly volume overloaded, and likely noncompliant with her Lasix as well.  BNP is elevated from her baseline.  Chest x-ray shows vascular congestion  which is unchanged from previous, but that x-ray was from an admission.  She was given IV Lasix here in the ER.  Patient continues to be dyspneic and tachypneic.  We attempted to ambulate her and she became more dyspneic.  Will need hospitalization for further diuresis.  She is not expensing any chest  pain.  Patient is on Xarelto, although cannot guarantee compliance.  I do not feel that current presentation is consistent with PE.  Final Clinical Impressions(s) / ED Diagnoses   Final diagnoses:  Atrial fibrillation with RVR ([place])  Acute on chronic congestive heart failure, unspecified heart failure type [facility])    ED Discharge Orders    None       Orpah Greek, MD 10/16/17 [phone]

## 2017-10-15 NOTE — ED Notes (Signed)
Bed: WA17 Expected date:  Expected time:  Means of arrival:  Comments: 72 yo F/Diarrhea

## 2017-10-16 ENCOUNTER — Other Ambulatory Visit: Payer: Self-pay

## 2017-10-16 DIAGNOSIS — I509 Heart failure, unspecified: Secondary | ICD-10-CM

## 2017-10-16 DIAGNOSIS — N182 Chronic kidney disease, stage 2 (mild): Secondary | ICD-10-CM | POA: Diagnosis present

## 2017-10-16 DIAGNOSIS — Z91199 Patient's noncompliance with other medical treatment and regimen due to unspecified reason: Secondary | ICD-10-CM

## 2017-10-16 DIAGNOSIS — I255 Ischemic cardiomyopathy: Secondary | ICD-10-CM

## 2017-10-16 DIAGNOSIS — I4891 Unspecified atrial fibrillation: Secondary | ICD-10-CM

## 2017-10-16 DIAGNOSIS — Z8249 Family history of ischemic heart disease and other diseases of the circulatory system: Secondary | ICD-10-CM | POA: Diagnosis not present

## 2017-10-16 DIAGNOSIS — R32 Unspecified urinary incontinence: Secondary | ICD-10-CM | POA: Diagnosis present

## 2017-10-16 DIAGNOSIS — I2583 Coronary atherosclerosis due to lipid rich plaque: Secondary | ICD-10-CM | POA: Diagnosis present

## 2017-10-16 DIAGNOSIS — Z951 Presence of aortocoronary bypass graft: Secondary | ICD-10-CM

## 2017-10-16 DIAGNOSIS — I2510 Atherosclerotic heart disease of native coronary artery without angina pectoris: Secondary | ICD-10-CM | POA: Diagnosis present

## 2017-10-16 DIAGNOSIS — I481 Persistent atrial fibrillation: Secondary | ICD-10-CM | POA: Diagnosis present

## 2017-10-16 DIAGNOSIS — I130 Hypertensive heart and chronic kidney disease with heart failure and stage 1 through stage 4 chronic kidney disease, or unspecified chronic kidney disease: Secondary | ICD-10-CM | POA: Diagnosis present

## 2017-10-16 DIAGNOSIS — I272 Pulmonary hypertension, unspecified: Secondary | ICD-10-CM | POA: Diagnosis present

## 2017-10-16 DIAGNOSIS — N183 Chronic kidney disease, stage 3 (moderate): Secondary | ICD-10-CM | POA: Diagnosis not present

## 2017-10-16 DIAGNOSIS — E875 Hyperkalemia: Secondary | ICD-10-CM | POA: Diagnosis present

## 2017-10-16 DIAGNOSIS — I5043 Acute on chronic combined systolic (congestive) and diastolic (congestive) heart failure: Secondary | ICD-10-CM | POA: Diagnosis present

## 2017-10-16 DIAGNOSIS — B961 Klebsiella pneumoniae [K. pneumoniae] as the cause of diseases classified elsewhere: Secondary | ICD-10-CM | POA: Diagnosis present

## 2017-10-16 DIAGNOSIS — J9601 Acute respiratory failure with hypoxia: Secondary | ICD-10-CM | POA: Diagnosis present

## 2017-10-16 DIAGNOSIS — Z7901 Long term (current) use of anticoagulants: Secondary | ICD-10-CM | POA: Diagnosis not present

## 2017-10-16 DIAGNOSIS — M25561 Pain in right knee: Secondary | ICD-10-CM | POA: Diagnosis present

## 2017-10-16 DIAGNOSIS — I10 Essential (primary) hypertension: Secondary | ICD-10-CM

## 2017-10-16 DIAGNOSIS — R413 Other amnesia: Secondary | ICD-10-CM | POA: Diagnosis present

## 2017-10-16 DIAGNOSIS — I351 Nonrheumatic aortic (valve) insufficiency: Secondary | ICD-10-CM | POA: Diagnosis not present

## 2017-10-16 DIAGNOSIS — M109 Gout, unspecified: Secondary | ICD-10-CM | POA: Diagnosis present

## 2017-10-16 DIAGNOSIS — Z9119 Patient's noncompliance with other medical treatment and regimen: Secondary | ICD-10-CM | POA: Diagnosis not present

## 2017-10-16 DIAGNOSIS — Z885 Allergy status to narcotic agent status: Secondary | ICD-10-CM | POA: Diagnosis not present

## 2017-10-16 DIAGNOSIS — N390 Urinary tract infection, site not specified: Secondary | ICD-10-CM | POA: Diagnosis present

## 2017-10-16 DIAGNOSIS — Z91030 Bee allergy status: Secondary | ICD-10-CM | POA: Diagnosis not present

## 2017-10-16 DIAGNOSIS — B9620 Unspecified Escherichia coli [E. coli] as the cause of diseases classified elsewhere: Secondary | ICD-10-CM | POA: Diagnosis present

## 2017-10-16 DIAGNOSIS — E876 Hypokalemia: Secondary | ICD-10-CM | POA: Diagnosis not present

## 2017-10-16 LAB — URINALYSIS, ROUTINE W REFLEX MICROSCOPIC
Bilirubin Urine: NEGATIVE
Glucose, UA: NEGATIVE mg/dL
Hgb urine dipstick: NEGATIVE
Ketones, ur: NEGATIVE mg/dL
Nitrite: NEGATIVE
Protein, ur: 30 mg/dL — AB
Specific Gravity, Urine: 1.017 (ref 1.005–1.030)
pH: 7 (ref 5.0–8.0)

## 2017-10-16 LAB — CBC WITH DIFFERENTIAL/PLATELET
Basophils Absolute: 0 10*3/uL (ref 0.0–0.1)
Basophils Relative: 0 %
Eosinophils Absolute: 0.1 10*3/uL (ref 0.0–0.7)
Eosinophils Relative: 1 %
HCT: 43.5 % (ref 36.0–46.0)
Hemoglobin: 14.4 g/dL (ref 12.0–15.0)
Lymphocytes Relative: 25 %
Lymphs Abs: 1.8 10*3/uL (ref 0.7–4.0)
MCH: 27.4 pg (ref 26.0–34.0)
MCHC: 33.1 g/dL (ref 30.0–36.0)
MCV: 82.9 fL (ref 78.0–100.0)
Monocytes Absolute: 0.5 10*3/uL (ref 0.1–1.0)
Monocytes Relative: 8 %
Neutro Abs: 4.7 10*3/uL (ref 1.7–7.7)
Neutrophils Relative %: 66 %
Platelets: 159 10*3/uL (ref 150–400)
RBC: 5.25 MIL/uL — ABNORMAL HIGH (ref 3.87–5.11)
RDW: 18.3 % — ABNORMAL HIGH (ref 11.5–15.5)
WBC: 7.1 10*3/uL (ref 4.0–10.5)

## 2017-10-16 LAB — BASIC METABOLIC PANEL
Anion gap: 8 (ref 5–15)
BUN: 14 mg/dL (ref 6–20)
CO2: 19 mmol/L — ABNORMAL LOW (ref 22–32)
Calcium: 9 mg/dL (ref 8.9–10.3)
Chloride: 117 mmol/L — ABNORMAL HIGH (ref 101–111)
Creatinine, Ser: 1.1 mg/dL — ABNORMAL HIGH (ref 0.44–1.00)
GFR calc Af Amer: 57 mL/min — ABNORMAL LOW (ref >60)
GFR calc non Af Amer: 49 mL/min — ABNORMAL LOW (ref >60)
Glucose, Bld: 137 mg/dL — ABNORMAL HIGH (ref 65–99)
Potassium: 6.6 mmol/L (ref 3.5–5.1)
Sodium: 144 mmol/L (ref 135–145)

## 2017-10-16 LAB — POTASSIUM: Potassium: 5.3 mmol/L — ABNORMAL HIGH (ref 3.5–5.1)

## 2017-10-16 LAB — I-STAT TROPONIN, ED: Troponin i, poc: 0.01 ng/mL (ref 0.00–0.08)

## 2017-10-16 LAB — BRAIN NATRIURETIC PEPTIDE: B Natriuretic Peptide: 699.6 pg/mL — ABNORMAL HIGH (ref 0.0–100.0)

## 2017-10-16 MED ORDER — SODIUM CHLORIDE 0.9 % IV SOLN: 250.0000 mL | INTRAVENOUS | Status: DC | PRN

## 2017-10-16 MED ORDER — BUDESONIDE 0.5 MG/2ML IN SUSP
0.5000 mg | Freq: Two times a day (BID) | RESPIRATORY_TRACT | Status: DC
  Administered 2017-10-16 – 2017-10-20 (×9): 0.5 mg via RESPIRATORY_TRACT
  Filled 2017-10-16 (×8): qty 2

## 2017-10-16 MED ORDER — SODIUM CHLORIDE 0.9% FLUSH
3.0000 mL | Freq: Two times a day (BID) | INTRAVENOUS | Status: DC
  Administered 2017-10-16 – 2017-10-20 (×9): 3 mL via INTRAVENOUS

## 2017-10-16 MED ORDER — ACETAMINOPHEN 325 MG PO TABS
650.0000 mg | ORAL_TABLET | Freq: Once | ORAL | Status: AC
Start: 2017-10-16 — End: 2017-10-16
  Administered 2017-10-16: 650 mg via ORAL
  Filled 2017-10-16: qty 2

## 2017-10-16 MED ORDER — FUROSEMIDE 10 MG/ML IJ SOLN
60.0000 mg | Freq: Once | INTRAMUSCULAR | Status: DC
  Filled 2017-10-16: qty 8

## 2017-10-16 MED ORDER — FUROSEMIDE 40 MG PO TABS
60.0000 mg | ORAL_TABLET | Freq: Two times a day (BID) | ORAL | Status: DC
  Administered 2017-10-16 (×2): 60 mg via ORAL
  Filled 2017-10-16 (×2): qty 2

## 2017-10-16 MED ORDER — ASPIRIN EC 81 MG PO TBEC
81.0000 mg | DELAYED_RELEASE_TABLET | Freq: Every day | ORAL | Status: DC
  Administered 2017-10-16 – 2017-10-20 (×5): 81 mg via ORAL
  Filled 2017-10-16 (×5): qty 1

## 2017-10-16 MED ORDER — RIVAROXABAN 20 MG PO TABS
20.0000 mg | ORAL_TABLET | Freq: Every day | ORAL | Status: DC
  Administered 2017-10-16 – 2017-10-20 (×5): 20 mg via ORAL
  Filled 2017-10-16 (×6): qty 1

## 2017-10-16 MED ORDER — SODIUM CHLORIDE 0.9% FLUSH: 3.0000 mL | INTRAVENOUS | Status: DC | PRN

## 2017-10-16 MED ORDER — ATORVASTATIN CALCIUM 10 MG PO TABS
10.0000 mg | ORAL_TABLET | Freq: Every day | ORAL | Status: DC
  Administered 2017-10-16 – 2017-10-20 (×5): 10 mg via ORAL
  Filled 2017-10-16 (×5): qty 1

## 2017-10-16 MED ORDER — METOPROLOL SUCCINATE ER 50 MG PO TB24
50.0000 mg | ORAL_TABLET | Freq: Two times a day (BID) | ORAL | Status: DC
  Administered 2017-10-16 – 2017-10-18 (×5): 50 mg via ORAL
  Filled 2017-10-16 (×6): qty 1

## 2017-10-16 MED ORDER — DILTIAZEM HCL ER COATED BEADS 180 MG PO CP24
360.0000 mg | ORAL_CAPSULE | Freq: Every day | ORAL | Status: DC
  Administered 2017-10-16 – 2017-10-20 (×5): 360 mg via ORAL
  Filled 2017-10-16 (×5): qty 2

## 2017-10-16 MED ORDER — ACETAMINOPHEN 325 MG PO TABS
650.0000 mg | ORAL_TABLET | Freq: Once | ORAL | Status: AC
  Administered 2017-10-16: 650 mg via ORAL
  Filled 2017-10-16: qty 2

## 2017-10-16 MED ORDER — POTASSIUM CHLORIDE CRYS ER 20 MEQ PO TBCR
40.0000 meq | EXTENDED_RELEASE_TABLET | Freq: Two times a day (BID) | ORAL | Status: DC
  Administered 2017-10-16 – 2017-10-20 (×8): 40 meq via ORAL
  Filled 2017-10-16 (×9): qty 2

## 2017-10-16 MED ORDER — LEVALBUTEROL HCL 0.63 MG/3ML IN NEBU: 0.6300 mg | INHALATION_SOLUTION | Freq: Four times a day (QID) | RESPIRATORY_TRACT | Status: DC | PRN

## 2017-10-16 NOTE — ED Notes (Signed)
Attempted IV access, pt continuously pulled back and could not obtain access.

## 2017-10-16 NOTE — ED Notes (Signed)
ED TO INPATIENT HANDOFF REPORT  Name/Age/Gender [name] [name] 72 y.o. female  Code Status    Code Status Orders  (From admission, onward)        Start     Ordered   10/16/17 0842  Full code  Continuous     10/16/17 0842    Code Status History    Date Active Date Inactive Code Status Order ID Comments User Context   09/05/2017 16:22 09/24/2017 17:33 Full Code [id number]  Consuelo Pandy, PA-C Inpatient   08/04/2017 16:41 08/06/2017 21:55 Full Code [id number]  Baldwin Jamaica, PA-C ED   07/26/2017 19:54 07/29/2017 19:06 Full Code [id number]  Satira Sark, MD ED   01/30/2017 23:44 01/31/2017 19:11 Full Code [id number]  Edwin Dada, MD Inpatient   11/14/2014 13:14 11/23/2014 17:06 Full Code [id number]  [name], MD Inpatient   11/04/2014 13:03 11/14/2014 13:14 Full Code [id number]  Troy Sine, MD Inpatient   11/03/2014 18:29 11/04/2014 13:03 Full Code [id number]  Corey Harold, NP Inpatient   10/29/2014 19:26 11/03/2014 18:29 Full Code [id number]  Deneise Lever, MD ED      Home/SNF/Other Home  Chief Complaint *  Level of Care/Admitting Diagnosis ED Disposition    ED Disposition Condition [facility] Area: [facility] [[id number]]  Level of Care: Telemetry [5]  Admit to tele based on following criteria: Complex arrhythmia (Bradycardia/Tachycardia)  Diagnosis: CHF exacerbation [facility]) [[id number]]  Admitting Physician: Norval Morton [[id number]]  Attending Physician: Norval Morton [[id number]]  Estimated length of stay: past midnight tomorrow  Certification:: I certify this patient will need inpatient services for at least 2 midnights  PT Class (Do Not Modify): Inpatient [101]  PT Acc Code (Do Not Modify): Private [1]       Medical History Past Medical History:  Diagnosis Date  . Acute respiratory failure ([place]) 08/2017  . Arthritis   . Asthma    ??  . CAD (coronary artery disease)    a. s/p CABG 2016.  .  Cardiomyopathy, ischemic    a. EF previously low, improved to LVEF 50-55% as of May 2018  . Chronic combined systolic and diastolic heart failure ([place])   . CKD (chronic kidney disease), stage III ([place])   . Gout   . Hypertension   . Hypokalemia   . LGI bleed 08/06/2017   a. felt to be hemorrhoidal during that admission (no drop in Hgb).  . Persistent atrial fibrillation (HCC)    a. h/o difficult to control rates (complicated by noncompliance), not felt to be a candidate for ablation or antiarrhythmic due to noncompliance.  . Personal history of noncompliance with medical treatment, presenting hazards to health   . S/P CABG x 2 with clipping of LA appendage 11/14/2014   LIMA to LAD, SVG to OM, EVH via right thigh    Allergies Allergies  Allergen Reactions  . Bee Venom Anaphylaxis  . Codeine Nausea And Vomiting  . Shrimp [Shellfish Allergy] Swelling  . Tomato     Pt states it gives her gout    IV Location/Drains/Wounds Patient Lines/Drains/Airways Status   Active Line/Drains/Airways    Name:   Placement date:   Placement time:   Site:   Days:   Peripheral IV 10/16/17 Right Forearm   10/16/17    0942    Forearm   less than 1          Labs/Imaging Results for orders placed or performed  during the hospital encounter of 10/15/17 (from the past 48 hour(s))  CBC with Differential/Platelet     Status: Abnormal   Collection Time: 10/16/17 12:14 AM  Result Value Ref Range   WBC 7.1 4.0 - 10.5 K/uL   RBC 5.25 (H) 3.87 - 5.11 MIL/uL   Hemoglobin 14.4 12.0 - 15.0 g/dL   HCT 43.5 36.0 - 46.0 %   MCV 82.9 78.0 - 100.0 fL   MCH 27.4 26.0 - 34.0 pg   MCHC 33.1 30.0 - 36.0 g/dL   RDW 18.3 (H) 11.5 - 15.5 %   Platelets 159 150 - 400 K/uL   Neutrophils Relative % 66 %   Neutro Abs 4.7 1.7 - 7.7 K/uL   Lymphocytes Relative 25 %   Lymphs Abs 1.8 0.7 - 4.0 K/uL   Monocytes Relative 8 %   Monocytes Absolute 0.5 0.1 - 1.0 K/uL   Eosinophils Relative 1 %   Eosinophils Absolute 0.1 0.0 -  0.7 K/uL   Basophils Relative 0 %   Basophils Absolute 0.0 0.0 - 0.1 K/uL    Comment: Performed at [facility], [place] [address] [place], [place] [address]  Basic metabolic panel     Status: Abnormal   Collection Time: 10/16/17 12:14 AM  Result Value Ref Range   Sodium 144 135 - 145 mmol/L   Potassium 6.6 (HH) 3.5 - 5.1 mmol/L    Comment: CRITICAL RESULT CALLED TO, READ BACK BY AND VERIFIED WITH: COLES L RN AT 0135 ON 10/16/2017 BY OKOYEHJ REPEATED TO VERIFY    Chloride 117 (H) 101 - 111 mmol/L   CO2 19 (L) 22 - 32 mmol/L   Glucose, Bld 137 (H) 65 - 99 mg/dL   BUN 14 6 - 20 mg/dL   Creatinine, Ser 1.10 (H) 0.44 - 1.00 mg/dL   Calcium 9.0 8.9 - 10.3 mg/dL   GFR calc non Af Amer 49 (L) >60 mL/min   GFR calc Af Amer 57 (L) >60 mL/min    Comment: (NOTE) The eGFR has been calculated using the CKD EPI equation. This calculation has not been validated in all clinical situations. eGFR's persistently <60 mL/min signify possible Chronic Kidney Disease.    Anion gap 8 5 - 15    Comment: Performed at [facility], [place] [address], [place], [place] [address]  Brain natriuretic peptide     Status: Abnormal   Collection Time: 10/16/17 12:14 AM  Result Value Ref Range   B Natriuretic Peptide 699.6 (H) 0.0 - 100.0 pg/mL    Comment: Performed at [facility], [place] [address], [place], [place] [address]  I-stat troponin, ED     Status: None   Collection Time: 10/16/17 12:28 AM  Result Value Ref Range   Troponin i, poc 0.01 0.00 - 0.08 ng/mL   Comment 3            Comment: Due to the release kinetics of cTnI, a negative result within the first hours of the onset of symptoms does not rule out myocardial infarction with certainty. If myocardial infarction is still suspected, repeat the test at appropriate intervals.   Potassium     Status: Abnormal   Collection Time: 10/16/17  2:21 AM  Result Value Ref Range   Potassium 5.3 (H) 3.5 - 5.1  mmol/L    Comment: DELTA CHECK NOTED CRITICAL VALUE NOTED.  VALUE IS CONSISTENT WITH PREVIOUSLY REPORTED AND CALLED VALUE. NO VISIBLE HEMOLYSIS Performed at [facility],  [place] [address], [place], [place] [address] CORRECTED ON 03/04 AT 0301: PREVIOUSLY REPORTED AS 5.2 DELTA CHECK NOTED CRITICAL VALUE NOTED.  VALUE IS CONSISTENT WITH PREVIOUSLY REPORTED AND CALLED VALUE. NO VISIBLE HEMOLYSIS   Urinalysis, Routine w reflex microscopic     Status: Abnormal   Collection Time: 10/16/17  3:53 AM  Result Value Ref Range   Color, Urine AMBER (A) YELLOW    Comment: BIOCHEMICALS MAY BE AFFECTED BY COLOR   APPearance CLOUDY (A) CLEAR   Specific Gravity, Urine 1.017 1.005 - 1.030   pH 7.0 5.0 - 8.0   Glucose, UA NEGATIVE NEGATIVE mg/dL   Hgb urine dipstick NEGATIVE NEGATIVE   Bilirubin Urine NEGATIVE NEGATIVE   Ketones, ur NEGATIVE NEGATIVE mg/dL   Protein, ur 30 (A) NEGATIVE mg/dL   Nitrite NEGATIVE NEGATIVE   Leukocytes, UA SMALL (A) NEGATIVE   RBC / HPF 0-5 0 - 5 RBC/hpf   WBC, UA 6-30 0 - 5 WBC/hpf   Bacteria, UA RARE (A) NONE SEEN   Squamous Epithelial / LPF 6-30 (A) NONE SEEN   Mucus PRESENT     Comment: Performed at [facility], [place] [address] [place], [place] [address]   Dg Chest Port 1 View  Result Date: 10/16/2017 CLINICAL DATA:  Shortness of breath. EXAM: PORTABLE CHEST 1 VIEW COMPARISON:  Radiograph 09/21/2017, CT 07/22/2017 FINDINGS: Post median sternotomy and clipping of the left atrial appendage. Cardiomegaly is unchanged from prior exam. Unchanged mediastinal contours. Unchanged vascular congestion from prior exam. Linear scarring in the left lower lung zone. No confluent consolidation. No large pleural effusion. No pneumothorax. IMPRESSION: Unchanged cardiomegaly and vascular congestion. Electronically Signed   By: Jeb Levering M.D.   On: 10/16/2017 00:10    Pending Labs Unresulted Labs (From admission, onward)   Start     Ordered    10/17/17 2831  Basic metabolic panel  Tomorrow morning,   R     10/16/17 0842   10/17/17 0500  CBC  Tomorrow morning,   R     10/16/17 0842   10/16/17 0430  Urine Culture  Once,   R     10/16/17 0429      Vitals/Pain Today's Vitals   10/16/17 1348 10/16/17 1537 10/16/17 1600 10/16/17 1703  BP:  (!) 154/98 (!) 136/94 (!) 144/78  Pulse: (!) 116 89 72 66  Resp: (!) 28 (!) 36 (!) 39 17  Temp:      TempSrc:      SpO2: 96% 95% 98% 97%  Weight:      Height:      PainSc:        Isolation Precautions No active isolations  Medications Medications  atorvastatin (LIPITOR) tablet 10 mg (10 mg Oral Given 10/16/17 1050)  budesonide (PULMICORT) nebulizer solution 0.5 mg (0.5 mg Nebulization Given 10/16/17 1031)  diltiazem (CARDIZEM CD) 24 hr capsule 360 mg (360 mg Oral Given 10/16/17 1051)  furosemide (LASIX) tablet 60 mg (60 mg Oral Given 10/16/17 1053)  levalbuterol (XOPENEX) nebulizer solution 0.63 mg (not administered)  metoprolol succinate (TOPROL-XL) 24 hr tablet 50 mg (50 mg Oral Given 10/16/17 1049)  potassium chloride SA (K-DUR,KLOR-CON) CR tablet 40 mEq (0 mEq Oral Hold 10/16/17 2200)  rivaroxaban (XARELTO) tablet 20 mg (not administered)  sodium chloride flush (NS) 0.9 % injection 3 mL (3 mLs Intravenous Given 10/16/17 1049)  sodium chloride flush (NS) 0.9 % injection 3 mL (not administered)  0.9 %  sodium chloride infusion (not administered)  aspirin  EC tablet 81 mg (81 mg Oral Given 10/16/17 1049)  diltiazem (CARDIZEM) tablet 60 mg (60 mg Oral Given 10/16/17 0016)  acetaminophen (TYLENOL) tablet 650 mg (650 mg Oral Given 10/16/17 0636)  acetaminophen (TYLENOL) tablet 650 mg (650 mg Oral Given 10/16/17 1702)    Mobility walks with person assist

## 2017-10-16 NOTE — Progress Notes (Signed)
This encounter was created in error - please disregard.

## 2017-10-16 NOTE — ED Notes (Signed)
Patient ambulates in hall, tolerating well; pt getting short of breath towards end of walk.

## 2017-10-16 NOTE — ED Notes (Signed)
ADMITTING Provider at bedside. 

## 2017-10-16 NOTE — Progress Notes (Signed)
Pt has home medications that have been locked in pharmacy. Please return to patient upon discharge.

## 2017-10-16 NOTE — Plan of Care (Signed)
Discussed with Dr. Betsey Holiday. [name] [name] is a 72 year old female with pmh chronic A. Fib on Xarelto, CHF, CAD s/p CABG, asthma; who presented with complaints of being in atrial fibrillation.  Heart rates into the 130s.  Labs revealed BNP of 699.6, chest x-ray showing unchanged cardiomegaly and vascular congestion.  Patient given oral Cardizem 60 mg, and furosemide 60 mg IV.  Question patient's compliance with medications given she lives alone and has history of dementia, but refused to go to nursing facility during last discharge.  Admit to a telemetry bed.

## 2017-10-16 NOTE — H&P (Signed)
History and Physical    [name] [name] PJA:[id number] DOB: 03-Aug-1946 DOA: 10/15/2017  PCP: [name], MD  Patient coming from: Home  Chief Complaint: Heart rate high  HPI: [name] [name] is a 72 y.o. female with medical history significant of A. fib on Xarelto, hypertension, coronary artery disease, chronic kidney disease comes in because she feels like her heart rate is high.  Patient reports that she is taking her medications as prescribed although at her previous cardiology notes noncompliance has been a main issue with her.  She lives alone and she has dementia noted in her chart however not getting a good idea that she has a history of dementia with talking to her.  She reports no recent illnesses.  She reports no nausea vomiting or diarrhea.  She denies any chest pain.  She reports that she has been short of breath and that is how she usually knows when her rate is out of whack.  Patient indeed found to be in A. fib with RVR and was given a IV dose of diltiazem and oral load last night in the ED.  Her rate has been around 100 or less since arrival to ED.  Patient is being referred for admission for A. fib with RVR  Review of Systems: As per HPI otherwise 10 point review of systems negative and seems reliable  Past Medical History:  Diagnosis Date  . Acute respiratory failure ([place]) 08/2017  . Arthritis   . Asthma    ??  . CAD (coronary artery disease)    a. s/p CABG 2016.  . Cardiomyopathy, ischemic    a. EF previously low, improved to LVEF 50-55% as of May 2018  . Chronic combined systolic and diastolic heart failure ([place])   . CKD (chronic kidney disease), stage III ([place])   . Gout   . Hypertension   . Hypokalemia   . LGI bleed 08/06/2017   a. felt to be hemorrhoidal during that admission (no drop in Hgb).  . Persistent atrial fibrillation (HCC)    a. h/o difficult to control rates (complicated by noncompliance), not felt to be a candidate for ablation or antiarrhythmic due to  noncompliance.  . Personal history of noncompliance with medical treatment, presenting hazards to health   . S/P CABG x 2 with clipping of LA appendage 11/14/2014   LIMA to LAD, SVG to OM, EVH via right thigh    Past Surgical History:  Procedure Laterality Date  . CARDIOVERSION N/A 11/18/2014   Procedure: CARDIOVERSION;  Surgeon: [name], MD;  Location: [facility];  Service: Cardiovascular;  Laterality: N/A;  . CLIPPING OF ATRIAL APPENDAGE N/A 11/14/2014   Procedure: CLIPPING OF ATRIAL APPENDAGE;  Surgeon: [name], MD;  Location: [facility];  Service: Open Heart Surgery;  Laterality: N/A;  . CORONARY ARTERY BYPASS GRAFT N/A 11/14/2014   Procedure: CORONARY ARTERY BYPASS GRAFTING (CABG)TIMES 2 USING LEFT INTERNAL MAMMARY ARTERY AND RIGHT SAPHENOUS VEIN HARVESTED ENDOSCOPICALLY;  Surgeon: [name], MD;  Location: [facility];  Service: Open Heart Surgery;  Laterality: N/A;  . LEFT HEART CATHETERIZATION WITH CORONARY ANGIOGRAM N/A 11/04/2014   Procedure: LEFT HEART CATHETERIZATION WITH CORONARY ANGIOGRAM;  Surgeon: Troy Sine, MD;  Location: [facility] CATH LAB;  Service: Cardiovascular;  Laterality: N/A;  . TEE WITHOUT CARDIOVERSION N/A 11/14/2014   Procedure: TRANSESOPHAGEAL ECHOCARDIOGRAM (TEE);  Surgeon: [name], MD;  Location: [facility] [place];  Service: Open Heart Surgery;  Laterality: N/A;  . TEMPORARY PACEMAKER INSERTION  11/04/2014   Procedure: TEMPORARY PACEMAKER INSERTION;  Surgeon: Troy Sine, MD;  Location: [facility] CATH LAB;  Service: Cardiovascular;;     reports that she has been smoking cigarettes.  She has a 5.60 pack-year smoking history. she has never used smokeless tobacco. She reports that she does not drink alcohol or use drugs.  Allergies  Allergen Reactions  . Bee Venom Anaphylaxis  . Codeine Nausea And Vomiting  . Shrimp [Shellfish Allergy] Swelling  . Tomato     Pt states it gives her gout    Family History  Problem Relation Age of Onset  . Cancer Mother   . Heart disease  Father     Prior to Admission medications   Medication Sig Start Date End Date Taking? Authorizing Provider  acetaminophen (TYLENOL) 500 MG tablet Take 500 mg by mouth every 6 (six) hours as needed for moderate pain.    Yes [provider]  atorvastatin (LIPITOR) 10 MG tablet Take 10 mg by mouth daily.   Yes [provider]  benzonatate (TESSALON PERLES) 100 MG capsule Take 1 capsule (100 mg total) by mouth every 6 (six) hours as needed for cough. 09/22/17 09/22/18 Yes Rizwan, Eunice Blase, MD  budesonide (PULMICORT) 0.5 MG/2ML nebulizer solution Take 2 mLs (0.5 mg total) by nebulization 2 (two) times daily. 09/22/17  Yes Debbe Odea, MD  carvedilol (COREG) 25 MG tablet Take 1 tablet (25 mg total) by mouth 2 (two) times daily with a meal. 08/02/17  Yes Sherran Needs, NP  diltiazem (CARDIZEM CD) 360 MG 24 hr capsule Take 1 capsule (360 mg total) by mouth daily. 09/13/17  Yes Kathyrn Drown D, NP  febuxostat ([name]) 40 MG tablet Take 40 mg by mouth daily.    Yes [provider]  fluticasone (FLONASE) 50 MCG/ACT nasal spray Place 1 spray into both nostrils daily. Patient taking differently: Place 1 spray into both nostrils daily as needed for allergies.  07/30/17  Yes Lendon Colonel, NP  furosemide (LASIX) 40 MG tablet Take 60 mg by mouth 2 (two) times daily.  08/14/17  Yes Hilty, Nadean Corwin, MD  guaiFENesin (MUCINEX) 600 MG 12 hr tablet Take 1 tablet (600 mg total) by mouth 2 (two) times daily. 09/22/17 09/22/18 Yes Debbe Odea, MD  levalbuterol (XOPENEX) 0.63 MG/3ML nebulizer solution Take 3 mLs (0.63 mg total) by nebulization every 6 (six) hours as needed for wheezing or shortness of breath. 09/22/17  Yes Debbe Odea, MD  magnesium oxide (MAG-OX) 400 (241.3 Mg) MG tablet Take 1 tablet (400 mg total) by mouth 3 (three) times daily. 09/13/17  Yes Kathyrn Drown D, NP  metoprolol succinate (TOPROL-XL) 50 MG 24 hr tablet Take 1 tablet (50 mg total) by mouth 2 (two) times daily. Take  with or immediately following a meal. 09/22/17  Yes Rizwan, Eunice Blase, MD  pantoprazole (PROTONIX) 40 MG tablet Take 1 tablet (40 mg total) by mouth daily at 12 noon. 09/22/17  Yes Debbe Odea, MD  potassium chloride SA (K-DUR,KLOR-CON) 20 MEQ tablet Take 2 tablets (40 mEq total) by mouth 2 (two) times daily. 07/29/17  Yes Lendon Colonel, NP  ranitidine (ZANTAC) 150 MG capsule Take 150 mg by mouth 2 (two) times daily.    Yes [provider]  rivaroxaban (XARELTO) 20 MG TABS tablet Take 1 tablet (20 mg total) by mouth daily with supper. 07/29/17  Yes Lendon Colonel, NP  tiZANidine (ZANAFLEX) 2 MG tablet Take 2 mg by mouth daily as needed for muscle spasms.  08/25/17  Yes [provider]  predniSONE (DELTASONE) 10 MG tablet 60 mg on 2/9. Then taper by 10 mg daily until finished. Patient not taking: Reported on 10/16/2017 09/22/17   Debbe Odea, MD    Physical Exam: Vitals:   10/16/17 0430 10/16/17 0600 10/16/17 0630 10/16/17 0831  BP: (!) 145/92 (!) 154/87 (!) 155/94 (!) 148/92  Pulse: 99 94 93 98  Resp: (!) 42 (!) 22 (!) 30 (!) 35  Temp:      TempSrc:      SpO2: 94% 95% 94% 94%  Weight:      Height:          Constitutional: NAD, calm, comfortable Vitals:   10/16/17 0430 10/16/17 0600 10/16/17 0630 10/16/17 0831  BP: (!) 145/92 (!) 154/87 (!) 155/94 (!) 148/92  Pulse: 99 94 93 98  Resp: (!) 42 (!) 22 (!) 30 (!) 35  Temp:      TempSrc:      SpO2: 94% 95% 94% 94%  Weight:      Height:       Eyes: PERRL, lids and conjunctivae normal ENMT: Mucous membranes are moist. Posterior pharynx clear of any exudate or lesions.Normal dentition.  Neck: normal, supple, no masses, no thyromegaly Respiratory: clear to auscultation bilaterally, no wheezing, no crackles. Normal respiratory effort. No accessory muscle use.  Cardiovascular: Regular rate and rhythm, no murmurs / rubs / gallops. No extremity edema. 2+ pedal pulses. No carotid bruits.  Abdomen: no tenderness, no  masses palpated. No hepatosplenomegaly. Bowel sounds positive.  Musculoskeletal: no clubbing / cyanosis. No joint deformity upper and lower extremities. Good ROM, no contractures. Normal muscle tone.  Skin: no rashes, lesions, ulcers. No induration Neurologic: CN 2-12 grossly intact. Sensation intact, DTR normal. Strength 5/5 in all 4.  Psychiatric: Normal judgment and insight. Alert and oriented x 3. Normal mood.    Labs on Admission: I have personally reviewed following labs and imaging studies  CBC: Recent Labs  Lab 10/16/17 0014  WBC 7.1  NEUTROABS 4.7  HGB 14.4  HCT 43.5  MCV 82.9  PLT 161   Basic Metabolic Panel: Recent Labs  Lab 10/16/17 0014 10/16/17 0221  NA 144  --   K 6.6* 5.3*  CL 117*  --   CO2 19*  --   GLUCOSE 137*  --   BUN 14  --   CREATININE 1.10*  --   CALCIUM 9.0  --    GFR: Estimated Creatinine Clearance: 54 mL/min (A) (by C-G formula based on SCr of 1.1 mg/dL (H)). Liver Function Tests: No results for input(s): AST, ALT, ALKPHOS, BILITOT, PROT, ALBUMIN in the last 168 hours. No results for input(s): LIPASE, AMYLASE in the last 168 hours. No results for input(s): AMMONIA in the last 168 hours. Coagulation Profile: No results for input(s): INR, PROTIME in the last 168 hours. Cardiac Enzymes: No results for input(s): CKTOTAL, CKMB, CKMBINDEX, TROPONINI in the last 168 hours. BNP (last 3 results) No results for input(s): PROBNP in the last 8760 hours. HbA1C: No results for input(s): HGBA1C in the last 72 hours. CBG: No results for input(s): GLUCAP in the last 168 hours. Lipid Profile: No results for input(s): CHOL, HDL, LDLCALC, TRIG, CHOLHDL, LDLDIRECT in the last 72 hours. Thyroid Function Tests: No results for input(s): TSH, T4TOTAL, FREET4, T3FREE, THYROIDAB in the last 72 hours. Anemia Panel: No results for input(s): VITAMINB12, FOLATE, FERRITIN, TIBC, IRON, RETICCTPCT in the last 72 hours. Urine analysis:    Component Value Date/Time  COLORURINE AMBER (A) 10/16/2017 0353   APPEARANCEUR CLOUDY (A) 10/16/2017 0353   LABSPEC 1.017 10/16/2017 0353   PHURINE 7.0 10/16/2017 0353   GLUCOSEU NEGATIVE 10/16/2017 0353   HGBUR NEGATIVE 10/16/2017 0353   BILIRUBINUR NEGATIVE 10/16/2017 0353   BILIRUBINUR ng 12/25/2013 1342   KETONESUR NEGATIVE 10/16/2017 0353   PROTEINUR 30 (A) 10/16/2017 0353   UROBILINOGEN 0.2 04/11/2017 1402   NITRITE NEGATIVE 10/16/2017 0353   LEUKOCYTESUR SMALL (A) 10/16/2017 0353   Sepsis Labs: !!!!!!!!!!!!!!!!!!!!!!!!!!!!!!!!!!!!!!!!!!!! @LABRCNTIP (procalcitonin:4,lacticidven:4) )No results found for this or any previous visit (from the past 240 hour(s)).   Radiological Exams on Admission: Dg Chest Port 1 View  Result Date: 10/16/2017 CLINICAL DATA:  Shortness of breath. EXAM: PORTABLE CHEST 1 VIEW COMPARISON:  Radiograph 09/21/2017, CT 07/22/2017 FINDINGS: Post median sternotomy and clipping of the left atrial appendage. Cardiomegaly is unchanged from prior exam. Unchanged mediastinal contours. Unchanged vascular congestion from prior exam. Linear scarring in the left lower lung zone. No confluent consolidation. No large pleural effusion. No pneumothorax. IMPRESSION: Unchanged cardiomegaly and vascular congestion. Electronically Signed   By: Jeb Levering M.D.   On: 10/16/2017 00:10    EKG: Independently reviewed.  A. fib with RVR Old chart reviewed  Assessment/Plan 72 year old female who comes in with A. fib with RVR and may be mild CHF exacerbation Principal Problem:   Atrial fibrillation with RVR (HCC)-continue her oral beta-blocker and diltiazem.  Monitor heart rate through the day.  She is currently not on any drips.  Patient symptomatically feeling better.  Continue Xarelto  Active Problems:   CHF exacerbation (HCC)-continue oral Lasix and see how she responds.  Not hypoxic.   Essential hypertension-continue home medications   Coronary artery disease due to lipid rich plaque-continue  aspirin   S/P CABG x 2-stable   CKD (chronic kidney disease), stage III (HCC)-stable at baseline   Cardiomyopathy, ischemic-last EF 50% per echo done in May 2018.  Will not repeat echo at this time.     DVT prophylaxis: Xarelto Code Status: Full  Family Communication: None Disposition Plan: Per day team Consults called: None Admission status: Admission   [name] [name] A MD [facility]  If 7PM-7AM, please contact night-coverage [email or web address] Password [facility]  10/16/2017, 8:32 AM

## 2017-10-16 NOTE — ED Notes (Signed)
Pt o2 while ambulating 90

## 2017-10-17 LAB — BASIC METABOLIC PANEL
Anion gap: 11 (ref 5–15)
BUN: 15 mg/dL (ref 6–20)
CO2: 22 mmol/L (ref 22–32)
Calcium: 8.7 mg/dL — ABNORMAL LOW (ref 8.9–10.3)
Chloride: 111 mmol/L (ref 101–111)
Creatinine, Ser: 0.97 mg/dL (ref 0.44–1.00)
GFR calc Af Amer: >60 mL/min (ref >60)
GFR calc non Af Amer: 57 mL/min — ABNORMAL LOW (ref >60)
Glucose, Bld: 116 mg/dL — ABNORMAL HIGH (ref 65–99)
Potassium: 3.5 mmol/L (ref 3.5–5.1)
Sodium: 144 mmol/L (ref 135–145)

## 2017-10-17 LAB — CBC
HCT: 41.6 % (ref 36.0–46.0)
Hemoglobin: 13.8 g/dL (ref 12.0–15.0)
MCH: 27.3 pg (ref 26.0–34.0)
MCHC: 33.2 g/dL (ref 30.0–36.0)
MCV: 82.2 fL (ref 78.0–100.0)
Platelets: 163 10*3/uL (ref 150–400)
RBC: 5.06 MIL/uL (ref 3.87–5.11)
RDW: 18.2 % — ABNORMAL HIGH (ref 11.5–15.5)
WBC: 6.8 10*3/uL (ref 4.0–10.5)

## 2017-10-17 MED ORDER — FUROSEMIDE 10 MG/ML IJ SOLN
40.0000 mg | Freq: Two times a day (BID) | INTRAMUSCULAR | Status: DC
  Administered 2017-10-17 – 2017-10-18 (×3): 40 mg via INTRAVENOUS
  Filled 2017-10-17 (×3): qty 4

## 2017-10-17 MED ORDER — ACETAMINOPHEN 325 MG PO TABS
650.0000 mg | ORAL_TABLET | Freq: Four times a day (QID) | ORAL | Status: DC | PRN
  Administered 2017-10-17 – 2017-10-20 (×8): 650 mg via ORAL
  Filled 2017-10-17 (×8): qty 2

## 2017-10-17 NOTE — Consult Note (Addendum)
   [facility] Inpatient Consult   10/17/2017  SHERRONDA SWEIGERT 05-Oct-1945 [id number]    Mrs. Kluttz was recently signed up with [place] [employer] program. She went to [facility] SN[facility] last hospitalization. However, upon discharge back home, Mrs. Wileman declined [place] Management follow up. Please see chart review tab then encounters for patient outreach details.   Spoke with Mrs. Vinton at bedside to discuss [place] Management engagement. Discussed with  Mrs. Hensler that [place] Management can assist with disease management for CHF. Explained the importance of allowing [place] [facility] to assist in her chronic disease management and coordination. Mrs. Saville is agreeable.   Mrs. Leng reports she has a scale but does not weigh daily.  She reports she takes her medications as directed.  States her family assists with transportation to MD appointments. Made her aware that [facility] LCSW could assist with transportation to MD appointments if needed.  Best contact number is home [phone] and cell [phone]. She technically lives alone. States she has boarder that rents a room out of her house.   Mrs. Dragos is agreeable to [facility] services. She is agreeable to [place] follow up. Active written consent remains on file. Provided [facility] Care Management brochure, 24-hr nurse advice line magnet, and contact information.   Explained that [place] Management will not interfere or replace services provided by home health.  Spoke with inpatient RNCM to discuss above notes.   Will make referral to [facility].  Will make inpatient RNCM aware of above.    Marthenia Rolling, MSN-Ed, RN,BSN [facility] Liaison [phone]

## 2017-10-17 NOTE — Care Management Note (Signed)
Case Management Note  Patient Details  Name: [name] [name] MRN: [id number] Date of Birth: 1946-03-20  Subjective/Objective: Ordered for HHRn-CHF protocal.PT-no f/u. Qualified for home 02-[facility] chosen for [employer] aware of [facility]. [place] rep Santiago Glad will deliver home 02 to rm prior d/c.                   Action/Plan:d/c home w/HHC/dme.   Expected Discharge Date:  (unknown)               Expected Discharge Plan:  [place]  In-House Referral:     Discharge planning Services  CM Consult  Post Acute Care Choice:    Choice offered to:     DME Arranged:  Oxygen DME Agency:  [place]:  RN [facility] Agency:  [place]  Status of Service:  In process, will continue to follow  If discussed at Long Length of Stay Meetings, dates discussed:    Additional Comments:  Dessa Phi, RN 10/17/2017, 2:53 PM

## 2017-10-17 NOTE — Progress Notes (Signed)
SATURATION QUALIFICATIONS: (This note is used to comply with regulatory documentation for home oxygen)  Patient Saturations on Room Air at Rest = 89%  Patient Saturations on Room Air while Ambulating = 84%  Patient Saturations on 2 Liters of oxygen while Ambulating = 88%  Please briefly explain why patient needs home oxygen:  Patient with hypoxia on RA with ambulation.  [place], [place] 10/17/2017

## 2017-10-17 NOTE — Evaluation (Signed)
Physical Therapy Evaluation Patient Details Name: [name] [name] MRN: [id number] DOB: Nov 05, 1945 Today's Date: 10/17/2017   History of Present Illness  [name] [name] again presents with acute congestive heart failure secondary to A. fib with RVR.  Recent stay due to similar issues and positive for flu.  PMH CAD, s/p CABG, P-Afib, HTN, ICM, gout.  Clinical Impression  Patient presents with mobility close to functional baseline.  Educated on fall risk reduction due to hospitalization and general weakness.  Do not feel she needs formal HHPT, but demonstrated desaturation ambulating on room air.  See separate note for O2 qualification.      Follow Up Recommendations No PT follow up    Equipment Recommendations  None recommended by PT    Recommendations for Other Services       Precautions / Restrictions Precautions Precautions: Fall Restrictions Weight Bearing Restrictions: No      Mobility  Bed Mobility               General bed mobility comments: up in chair  Transfers Overall transfer level: Modified independent Equipment used: None Transfers: Sit to/from Stand              Ambulation/Gait Ambulation/Gait assistance: Supervision Ambulation Distance (Feet): 200 Feet Assistive device: None Gait Pattern/deviations: Step-through pattern;Wide base of support     General Gait Details: amb on RA SpO2 84% HR 133, on 2L O2 SpO2 89%; no LOB even with turning to look behind at NT talking with staff  Stairs            Wheelchair Mobility    Modified Rankin (Stroke Patients Only)       Balance Overall balance assessment: Needs assistance Sitting-balance support: Feet supported Sitting balance-Leahy Scale: Good     Standing balance support: No upper extremity supported Standing balance-Leahy Scale: Good Standing balance comment: ambulating and able to look back without LOB                             Pertinent Vitals/Pain Pain Assessment:  Faces Faces Pain Scale: Hurts a little bit Pain Location: rt foot Pain Descriptors / Indicators: Tender Pain Intervention(s): Monitored during session;Repositioned    Home Living Family/patient expects to be discharged to:: Private residence Living Arrangements: Other (Comment)(roomate) Available Help at Discharge: Family;Friend(s);Available PRN/intermittently Type of Home: House Home Access: Stairs to enter Entrance Stairs-Rails: Psychiatric nurse of Steps: 3 Home Layout: One level Home Equipment: Cane - single point      Prior Function Level of Independence: Independent with assistive device(s)         Comments: Uses cane at times due to gout     Hand Dominance   Dominant Hand: Right    Extremity/Trunk Assessment   Upper Extremity Assessment Upper Extremity Assessment: Overall WFL for tasks assessed    Lower Extremity Assessment Lower Extremity Assessment: RLE deficits/detail;LLE deficits/detail RLE Deficits / Details: AROM WFL, strength hip flexion 4-/5, knee extension 4/5, ankle DF 4/5 LLE Deficits / Details: AROM WFL, strength hip flexion 4-/5, knee extension 4/5, ankle DF 4/5       Communication   Communication: No difficulties  Cognition Arousal/Alertness: Awake/alert Behavior During Therapy: WFL for tasks assessed/performed Overall Cognitive Status: Within Functional Limits for tasks assessed  General Comments      Exercises     Assessment/Plan    PT Assessment [place] does not need any further PT services  PT Problem List         PT Treatment Interventions      PT Goals (Current goals can be found in the Care Plan section)  Acute Rehab PT Goals PT Goal Formulation: All assessment and education complete, DC therapy    Frequency     Barriers to discharge        Co-evaluation               AM-PAC PT "6 Clicks" Daily Activity  Outcome Measure Difficulty  turning over in bed (including adjusting bedclothes, sheets and blankets)?: None Difficulty moving from lying on back to sitting on the side of the bed? : A Little Difficulty sitting down on and standing up from a chair with arms (e.g., wheelchair, bedside commode, etc,.)?: A Little Help needed moving to and from a bed to chair (including a wheelchair)?: A Little Help needed walking in hospital room?: A Little Help needed climbing 3-5 steps with a railing? : A Little 6 Click Score: 19    End of Session Equipment Utilized During Treatment: Gait belt;Oxygen Activity Tolerance: Patient tolerated treatment well Patient left: with call bell/phone within reach;in chair   PT Visit Diagnosis: Muscle weakness (generalized) (M62.81)    Time: 1120-1130(&12:32-12:44) PT Time Calculation (min) (ACUTE ONLY): 10 min   Charges:   PT Evaluation $PT Eval Low Complexity: 1 Low     PT G CodesMagda [name], [place] [phone] 10/17/2017   [name] [name] 10/17/2017, 1:11 PM

## 2017-10-17 NOTE — Progress Notes (Signed)
[facility] Progress Note  Patient: [name] [name] URK:[id number]   PCP: [name], MD DOB: 14-Sep-1945   DOA: 10/15/2017   DOS: 10/17/2017   Date of Service: the patient was seen and examined on 10/17/2017  Subjective: Patient is feeling better, denies shortness of breath.  Does not think that her legs are swollen.  No abdominal pain no diarrhea no constipation.    Brief hospital course: Pt. with PMH of A. fib on Xarelto, HTN, CAD, CKD, chronic diastolic CHF; admitted on 10/15/2017, presented with complaint of shortness of breath, was found to have acute on chronic CHF with hypoxia. Currently further plan is continue diuresis.  Assessment and Plan: 1.  A. fib with RVR. Currently rate controlled. Continue beta-blocker as well as Cardizem. Monitor on telemetry. Continue anticoagulation.  2.  Acute on chronic diastolic CHF. Patient was actually on oral Lasix, will switch her to IV Lasix 40 mg twice daily. Patient is also significantly hypoxic currently requiring oxygen. Patient does not have good understanding of her disease process, home health RN arranged.  3.  Chronic kidney disease stage III. Renal function baseline. Monitor with diuresis.  4.  CAD S/P CABG. Continue aspirin.  Diet: Cardiac diet DVT Prophylaxis: on therapeutic anticoagulation.  Advance goals of care discussion: full code  Family Communication: no family was present at bedside, at the time of interview.   Disposition:  Discharge to home.  Consultants: none Procedures: none  Antibiotics: Anti-infectives (From admission, onward)   None       Objective: Physical Exam: Vitals:   10/17/17 0750 10/17/17 0753 10/17/17 0955 10/17/17 1533  BP:   140/78 120/87  Pulse:   84 96  Resp:    18  Temp:    98 F (36.7 C)  TempSrc:    Oral  SpO2: 95% 95%  94%  Weight:      Height:        Intake/Output Summary (Last 24 hours) at 10/17/2017 1943 Last data filed at 10/17/2017 1500 Gross per 24 hour  Intake  240 ml  Output 2225 ml  Net -1985 ml   Filed Weights   10/15/17 2231 10/16/17 1754 10/17/17 0535  Weight: 90.7 kg (200 lb) 90.3 kg (199 lb) 88.6 kg (195 lb 5.2 oz)   General: Alert, Awake and Oriented to Time, Place and Person. Appear in mild distress, affect blunted Eyes: PERRL, Conjunctiva normal ENT: Oral Mucosa clear moist. Neck: no JVD, no Abnormal Mass Or lumps Cardiovascular: S1 and S2 Present, no Murmur, Peripheral Pulses Present Respiratory: normal respiratory effort, Bilateral Air entry equal and Decreased, no use of accessory muscle, basal Crackles, no wheezes Abdomen: Bowel Sound present, Soft and no tenderness, no hernia Skin: no redness, no Rash, no induration Extremities: bilateral Pedal edema, no calf tenderness Neurologic: Grossly no focal neuro deficit. Bilaterally Equal motor strength  Data Reviewed: CBC: Recent Labs  Lab 10/16/17 0014 10/17/17 0514  WBC 7.1 6.8  NEUTROABS 4.7  --   HGB 14.4 13.8  HCT 43.5 41.6  MCV 82.9 82.2  PLT 159 831   Basic Metabolic Panel: Recent Labs  Lab 10/16/17 0014 10/16/17 0221 10/17/17 0514  NA 144  --  144  K 6.6* 5.3* 3.5  CL 117*  --  111  CO2 19*  --  22  GLUCOSE 137*  --  116*  BUN 14  --  15  CREATININE 1.10*  --  0.97  CALCIUM 9.0  --  8.7*    Liver Function Tests:  No results for input(s): AST, ALT, ALKPHOS, BILITOT, PROT, ALBUMIN in the last 168 hours. No results for input(s): LIPASE, AMYLASE in the last 168 hours. No results for input(s): AMMONIA in the last 168 hours. Coagulation Profile: No results for input(s): INR, PROTIME in the last 168 hours. Cardiac Enzymes: No results for input(s): CKTOTAL, CKMB, CKMBINDEX, TROPONINI in the last 168 hours. BNP (last 3 results) No results for input(s): PROBNP in the last 8760 hours. CBG: No results for input(s): GLUCAP in the last 168 hours. Studies: No results found.  Scheduled Meds: . aspirin EC  81 mg Oral Daily  . atorvastatin  10 mg Oral Daily  .  budesonide  0.5 mg Nebulization BID  . diltiazem  360 mg Oral Daily  . furosemide  40 mg Intravenous BID  . metoprolol succinate  50 mg Oral BID  . potassium chloride SA  40 mEq Oral BID  . rivaroxaban  20 mg Oral Q supper  . sodium chloride flush  3 mL Intravenous Q12H   Continuous Infusions: . sodium chloride     PRN Meds: sodium chloride, acetaminophen, levalbuterol, sodium chloride flush  Time spent: 35 minutes  Author: Berle Mull, MD [facility] Pager: [phone] 10/17/2017 7:43 PM  If 7PM-7AM, please contact night-coverage at [email or web address], password [facility]

## 2017-10-18 LAB — BASIC METABOLIC PANEL
Anion gap: 10 (ref 5–15)
BUN: 15 mg/dL (ref 6–20)
CO2: 23 mmol/L (ref 22–32)
Calcium: 9 mg/dL (ref 8.9–10.3)
Chloride: 112 mmol/L — ABNORMAL HIGH (ref 101–111)
Creatinine, Ser: 0.97 mg/dL (ref 0.44–1.00)
GFR calc Af Amer: >60 mL/min (ref >60)
GFR calc non Af Amer: 57 mL/min — ABNORMAL LOW (ref >60)
Glucose, Bld: 127 mg/dL — ABNORMAL HIGH (ref 65–99)
Potassium: 3.8 mmol/L (ref 3.5–5.1)
Sodium: 145 mmol/L (ref 135–145)

## 2017-10-18 LAB — CBC
HCT: 44.8 % (ref 36.0–46.0)
Hemoglobin: 14.5 g/dL (ref 12.0–15.0)
MCH: 26.9 pg (ref 26.0–34.0)
MCHC: 32.4 g/dL (ref 30.0–36.0)
MCV: 83.1 fL (ref 78.0–100.0)
Platelets: 186 10*3/uL (ref 150–400)
RBC: 5.39 MIL/uL — ABNORMAL HIGH (ref 3.87–5.11)
RDW: 18.6 % — ABNORMAL HIGH (ref 11.5–15.5)
WBC: 7.9 10*3/uL (ref 4.0–10.5)

## 2017-10-18 LAB — MAGNESIUM: Magnesium: 1.7 mg/dL (ref 1.7–2.4)

## 2017-10-18 MED ORDER — PREDNISONE 50 MG PO TABS: 50.0000 mg | ORAL_TABLET | Freq: Every day | ORAL | Status: DC

## 2017-10-18 MED ORDER — PREDNISONE 20 MG PO TABS
60.0000 mg | ORAL_TABLET | Freq: Once | ORAL | Status: DC
  Filled 2017-10-18: qty 3

## 2017-10-18 MED ORDER — PREDNISONE 20 MG PO TABS: 40.0000 mg | ORAL_TABLET | Freq: Every day | ORAL | Status: DC

## 2017-10-18 MED ORDER — FEBUXOSTAT 40 MG PO TABS
40.0000 mg | ORAL_TABLET | Freq: Every day | ORAL | Status: DC
Start: 2017-10-18 — End: 2017-10-20
  Administered 2017-10-18 – 2017-10-20 (×3): 40 mg via ORAL
  Filled 2017-10-18 (×3): qty 1

## 2017-10-18 MED ORDER — SODIUM CHLORIDE 0.9 % IV SOLN
1.0000 g | INTRAVENOUS | Status: DC
  Administered 2017-10-18: 1 g via INTRAVENOUS
  Filled 2017-10-18: qty 1

## 2017-10-18 MED ORDER — PREDNISONE 20 MG PO TABS
20.0000 mg | ORAL_TABLET | Freq: Every day | ORAL | Status: DC
  Administered 2017-10-18 – 2017-10-20 (×3): 20 mg via ORAL
  Filled 2017-10-18 (×2): qty 1

## 2017-10-18 MED ORDER — PREDNISONE 20 MG PO TABS: 30.0000 mg | ORAL_TABLET | Freq: Every day | ORAL | Status: DC

## 2017-10-18 MED ORDER — METOPROLOL SUCCINATE ER 100 MG PO TB24
100.0000 mg | ORAL_TABLET | Freq: Two times a day (BID) | ORAL | Status: DC
  Administered 2017-10-18 – 2017-10-20 (×4): 100 mg via ORAL
  Filled 2017-10-18 (×4): qty 1

## 2017-10-18 MED ORDER — PREDNISONE 20 MG PO TABS: 20.0000 mg | ORAL_TABLET | Freq: Every day | ORAL | Status: DC

## 2017-10-18 MED ORDER — FUROSEMIDE 10 MG/ML IJ SOLN
60.0000 mg | Freq: Two times a day (BID) | INTRAMUSCULAR | Status: DC
  Administered 2017-10-18 – 2017-10-19 (×3): 60 mg via INTRAVENOUS
  Filled 2017-10-18 (×3): qty 6

## 2017-10-18 MED ORDER — PREDNISONE 5 MG PO TABS: 10.0000 mg | ORAL_TABLET | Freq: Every day | ORAL | Status: DC

## 2017-10-18 NOTE — Consult Note (Signed)
Cardiology Consultation:   Patient ID: RESHUNDA STRIDER; [id number]; Mar 10, 1946   Admit date: 10/15/2017 Date of Consult: 10/18/2017  Primary Care Provider: [name], MD Primary Cardiologist: Dr. Debara Pickett  Patient Profile:   [name] [name] is a 72 y.o. female with a hx of CAD s/p CABG in 2016, ischemic cardiomyopathy with improvement in EF to normal s/p CABG, persistent atrial fibrillation w/ CHA2DS2 VASc score of 5 on Xarelto, s/p L AA clipping, HTN, CKD III and questionable medication compliance who is being seen today for the evaluation of CHF exacerbation and atrial fibrillation with RVR at the request of Dr. Posey Pronto.  History of Present Illness:   Of note the patient has had multiple hospital admissions over the course of the last several months for atrial fibrillation with rapid ventricular response in which there were several adjustments in her medications to achieve greater control.  She is followed by Dr. Debara Pickett and is also seen in the A. fib clinic by Roderic Palau, NP and Dr. Curt Bears.  She was initially noted to be in atrial fibrillation in 2016 with a reduced LVEF, leading to ischemic workup with a cardiac cath showing severe multivessel CAD with LM involvement in which she underwent CABG x2 utilizing LIMA to LAD, SVG to OM.  EF improved from 45% back to normal range, 50-55% post CABG.  [name] [name] is a 72 year old female who, was most recently followed by cardiology during her last hospitalization (09/05/17-09/21/17) for dyspnea and palpitations found to be, once again, in atrial fibrillation with RVR, acute on chronic diastolic CHF and hypoxia complicated by Flu A positive and delirium with plans to gain heart rate control and aggressively diuresis in preparation for discharge to SNF on 09/21/2017. During that hospitalization, she was started on diltiazem drip, EP was consulted to discuss plans for Tikosyn vs. ablation vs. medication rate control however, due to her known medication  noncompliance she was found to not be an ablation nor antiarrhythmic candidate. Her diltiazem was transitioned from IV to PO and increased to 360 mg daily with greater rate control. She was started on IV Lasix with net -9.8 L of fluid off. On 09/14/2017 she was found to be febrile, markedly confusion and delirius. Her Flu swab came back positive for type A.  This extended her hospital course by approximately one week. On 09/21/2017 as cardiology signed off in anticipation of SNF discharge she was on diltiazem 360 mg and metoprolol succinate 50 mg for rate control. Given her known heart failure, she was on Lasix 60 mg twice per day.  She presented to the emergency room on 10/15/2017 with complaints of palpitations and feeling that her heart rate was high. At that time, she reports taking her medications as prescribed however, these were most likely given her as she was placed in SNF facility after her last discharge approximately 09/21/2017.  She reports being at the facility for 10 days.  She reports no recent illnesses including nausea, vomiting or upper respiratory infection.  She denies chest or back pain, dizziness or syncope.  She does however report that she has been short of breath with her elevated heart rate.  In the emergency room she was found to be in  A. fib RVR and was started on diltiazem drip.  Her PO dose of Lasix was transitioned to IV form for greater diuresis.  Chest x-ray was completed which showed unchanged cardiomegaly and vascular congestion consistent with her heart failure presentation. Her BNP was 699.6, troponin level  was 0.01, and she was hyperkalemic at 6.6.  She has been afebrile.  Her medications were restarted including her diltiazem PO 360 mg, metoprolol succinate 50 mg, as well as her Xarelto.  She appears to currently be rate controlled with heart rates in the low 100s.  Cardiology was consulted given her extensive hospitalizations and past medical history.  Past Medical History:    Diagnosis Date  . Acute respiratory failure ([place]) 08/2017  . Arthritis   . Asthma    ??  . CAD (coronary artery disease)    a. s/p CABG 2016.  . Cardiomyopathy, ischemic    a. EF previously low, improved to LVEF 50-55% as of May 2018  . Chronic combined systolic and diastolic heart failure ([place])   . CKD (chronic kidney disease), stage III ([place])   . Gout   . Hypertension   . Hypokalemia   . LGI bleed 08/06/2017   a. felt to be hemorrhoidal during that admission (no drop in Hgb).  . Persistent atrial fibrillation ([facility])    a. h/o difficult to control rates (complicated by noncompliance), not felt to be a candidate for ablation or antiarrhythmic due to noncompliance.  . Personal history of noncompliance with medical treatment, presenting hazards to health   . S/P CABG x 2 with clipping of LA appendage 11/14/2014   LIMA to LAD, SVG to OM, EVH via right thigh    Past Surgical History:  Procedure Laterality Date  . CARDIOVERSION N/A 11/18/2014   Procedure: CARDIOVERSION;  Surgeon: [name], MD;  Location: [place];  Service: Cardiovascular;  Laterality: N/A;  . CLIPPING OF ATRIAL APPENDAGE N/A 11/14/2014   Procedure: CLIPPING OF ATRIAL APPENDAGE;  Surgeon: [name], MD;  Location: [place];  Service: Open Heart Surgery;  Laterality: N/A;  . CORONARY ARTERY BYPASS GRAFT N/A 11/14/2014   Procedure: CORONARY ARTERY BYPASS GRAFTING (CABG)TIMES 2 USING LEFT INTERNAL MAMMARY ARTERY AND RIGHT SAPHENOUS VEIN HARVESTED ENDOSCOPICALLY;  Surgeon: [name], MD;  Location: [place];  Service: Open Heart Surgery;  Laterality: N/A;  . LEFT HEART CATHETERIZATION WITH CORONARY ANGIOGRAM N/A 11/04/2014   Procedure: LEFT HEART CATHETERIZATION WITH CORONARY ANGIOGRAM;  Surgeon: Troy Sine, MD;  Location: [facility] CATH LAB;  Service: Cardiovascular;  Laterality: N/A;  . TEE WITHOUT CARDIOVERSION N/A 11/14/2014   Procedure: TRANSESOPHAGEAL ECHOCARDIOGRAM (TEE);  Surgeon: [name], MD;  Location: [place];   Service: Open Heart Surgery;  Laterality: N/A;  . TEMPORARY PACEMAKER INSERTION  11/04/2014   Procedure: TEMPORARY PACEMAKER INSERTION;  Surgeon: Troy Sine, MD;  Location: [facility] CATH LAB;  Service: Cardiovascular;;     Prior to Admission medications   Medication Sig Start Date End Date Taking? Authorizing Provider  acetaminophen (TYLENOL) 500 MG tablet Take 500 mg by mouth every 6 (six) hours as needed for moderate pain.    Yes [provider]  atorvastatin (LIPITOR) 10 MG tablet Take 10 mg by mouth daily.   Yes [provider]  benzonatate (TESSALON PERLES) 100 MG capsule Take 1 capsule (100 mg total) by mouth every 6 (six) hours as needed for cough. 09/22/17 09/22/18 Yes Rizwan, Eunice Blase, MD  budesonide (PULMICORT) 0.5 MG/2ML nebulizer solution Take 2 mLs (0.5 mg total) by nebulization 2 (two) times daily. 09/22/17  Yes Debbe Odea, MD  diltiazem (CARDIZEM CD) 360 MG 24 hr capsule Take 1 capsule (360 mg total) by mouth daily. 09/13/17  Yes Kathyrn Drown D, NP  febuxostat ([name]) 40 MG tablet Take 40  mg by mouth daily.    Yes [provider]  fluticasone (FLONASE) 50 MCG/ACT nasal spray Place 1 spray into both nostrils daily. Patient taking differently: Place 1 spray into both nostrils daily as needed for allergies.  07/30/17  Yes Lendon Colonel, NP  furosemide (LASIX) 40 MG tablet Take 60 mg by mouth 2 (two) times daily.  08/14/17  Yes Hilty, Nadean Corwin, MD  guaiFENesin (MUCINEX) 600 MG 12 hr tablet Take 1 tablet (600 mg total) by mouth 2 (two) times daily. 09/22/17 09/22/18 Yes Debbe Odea, MD  levalbuterol (XOPENEX) 0.63 MG/3ML nebulizer solution Take 3 mLs (0.63 mg total) by nebulization every 6 (six) hours as needed for wheezing or shortness of breath. 09/22/17  Yes Debbe Odea, MD  magnesium oxide (MAG-OX) 400 (241.3 Mg) MG tablet Take 1 tablet (400 mg total) by mouth 3 (three) times daily. 09/13/17  Yes Kathyrn Drown D, NP  metoprolol succinate (TOPROL-XL) 50 MG 24  hr tablet Take 1 tablet (50 mg total) by mouth 2 (two) times daily. Take with or immediately following a meal. 09/22/17  Yes Rizwan, Eunice Blase, MD  pantoprazole (PROTONIX) 40 MG tablet Take 1 tablet (40 mg total) by mouth daily at 12 noon. 09/22/17  Yes Debbe Odea, MD  potassium chloride SA (K-DUR,KLOR-CON) 20 MEQ tablet Take 2 tablets (40 mEq total) by mouth 2 (two) times daily. 07/29/17  Yes Lendon Colonel, NP  ranitidine (ZANTAC) 150 MG capsule Take 150 mg by mouth 2 (two) times daily.    Yes [provider]  rivaroxaban (XARELTO) 20 MG TABS tablet Take 1 tablet (20 mg total) by mouth daily with supper. 07/29/17  Yes Lendon Colonel, NP  tiZANidine (ZANAFLEX) 2 MG tablet Take 2 mg by mouth daily as needed for muscle spasms. 08/25/17  Yes [provider]  predniSONE (DELTASONE) 10 MG tablet 60 mg on 2/9. Then taper by 10 mg daily until finished. Patient not taking: Reported on 10/16/2017 09/22/17   Debbe Odea, MD    Inpatient Medications: Scheduled Meds: . aspirin EC  81 mg Oral Daily  . atorvastatin  10 mg Oral Daily  . budesonide  0.5 mg Nebulization BID  . diltiazem  360 mg Oral Daily  . febuxostat  40 mg Oral Daily  . furosemide  40 mg Intravenous BID  . metoprolol succinate  50 mg Oral BID  . potassium chloride SA  40 mEq Oral BID  . predniSONE  60 mg Oral Once  . rivaroxaban  20 mg Oral Q supper  . sodium chloride flush  3 mL Intravenous Q12H   Continuous Infusions: . sodium chloride     PRN Meds: sodium chloride, acetaminophen, levalbuterol, sodium chloride flush  Allergies:    Allergies  Allergen Reactions  . Bee Venom Anaphylaxis  . Codeine Nausea And Vomiting  . Shrimp [Shellfish Allergy] Swelling  . Tomato     Pt states it gives her gout    Social History:   Social History   Socioeconomic History  . Marital status: Widowed    Spouse name: Not on file  . Number of children: Not on file  . Years of education: Not on file  . Highest  education level: Not on file  Social Needs  . Financial resource strain: Not on file  . Food insecurity - worry: Not on file  . Food insecurity - inability: Not on file  . Transportation needs - medical: Not on file  . Transportation needs - non-medical: Not on  file  Occupational History  . Not on file  Tobacco Use  . Smoking status: Current Some Day Smoker    Packs/day: 0.10    Years: 56.00    Pack years: 5.60    Types: Cigarettes  . Smokeless tobacco: Never Used  Substance and Sexual Activity  . Alcohol use: No    Alcohol/week: 0.0 oz  . Drug use: No  . Sexual activity: Not on file  Other Topics Concern  . Not on file  Social History Narrative  . Not on file    Family History:   Family History  Problem Relation Age of Onset  . Cancer Mother   . Heart disease Father    Family Status:  Family Status  Relation Name Status  . Mother  Deceased  . Father  Deceased    ROS:  Please see the history of present illness.  All other ROS reviewed and negative.     Physical Exam/Data:   Vitals:   10/17/17 2102 10/18/17 0511 10/18/17 0848 10/18/17 0851  BP: 120/80 140/85    Pulse: (!) 109 84    Resp: 18 16    Temp: 98.1 F (36.7 C) 97.8 F (36.6 C)    TempSrc: Oral Oral    SpO2: 94% 94% 94% 94%  Weight:  197 lb 5 oz (89.5 kg)    Height:        Intake/Output Summary (Last 24 hours) at 10/18/2017 0940 Last data filed at 10/18/2017 0600 Gross per 24 hour  Intake 123 ml  Output 1100 ml  Net -977 ml   Filed Weights   10/16/17 1754 10/17/17 0535 10/18/17 0511  Weight: 199 lb (90.3 kg) 195 lb 5.2 oz (88.6 kg) 197 lb 5 oz (89.5 kg)   Body mass index is 30.9 kg/m.   General: Well developed, well nourished, NAD Skin: Warm, dry, intact  Head: Normocephalic, atraumatic, clear, moist mucus membranes. Neck: Negative for carotid bruits. No JVD Lungs: Diminished in bilateral bases. No wheezes, rales, or rhonchi. Breathing is mildly labored. Cardiovascular: Irregular with  S1 S2. No murmurs, rubs, or gallops Abdomen: Soft, non-tender, non-distended with normoactive bowel sounds. No obvious abdominal masses. MSK: Strength and tone appear normal for age. 5/5 in all extremities Extremities: No edema. No clubbing or cyanosis. DP/PT pulses 2+ bilaterally Neuro: Alert and oriented. No focal deficits. No facial asymmetry. MAE spontaneously. Psych: Responds to questions appropriately with normal affect.     EKG:  The EKG was personally reviewed and demonstrates:  No new tracing, will obtain Telemetry:  Telemetry was personally reviewed and demonstrates:  10/18/17 Atrial fibrillation HR 100 per tele review  Relevant CV Studies:  ECHO: 01/11/17 Study Conclusions  - Left ventricle: Posterior lateral hypokinesis. The cavity size   was normal. Systolic function was normal. The estimated ejection   fraction was in the range of 50% to 55%. Left ventricular   diastolic function parameters were normal. - Aortic valve: There was mild regurgitation. - Mitral valve: There was mild regurgitation. - Left atrium: The atrium was moderately dilated. - Atrial septum: No defect or patent foramen ovale was identified.  Laboratory Data:  Chemistry Recent Labs  Lab 10/16/17 0014 10/16/17 0221 10/17/17 0514 10/18/17 0522  NA 144  --  144 145  K 6.6* 5.3* 3.5 3.8  CL 117*  --  111 112*  CO2 19*  --  22 23  GLUCOSE 137*  --  116* 127*  BUN 14  --  15 15  CREATININE  1.10*  --  0.97 0.97  CALCIUM 9.0  --  8.7* 9.0  GFRNONAA 49*  --  57* 57*  GFRAA 57*  --  >60 >60  ANIONGAP 8  --  11 10    Total Protein  Date Value Ref Range Status  07/26/2017 6.5 6.5 - 8.1 g/dL Final   Albumin  Date Value Ref Range Status  07/26/2017 3.0 (L) 3.5 - 5.0 g/dL Final   AST  Date Value Ref Range Status  07/26/2017 39 15 - 41 U/L Final   ALT  Date Value Ref Range Status  07/26/2017 53 14 - 54 U/L Final   Alkaline Phosphatase  Date Value Ref Range Status  07/26/2017 244 (H) 38 -  126 U/L Final   Total Bilirubin  Date Value Ref Range Status  07/26/2017 1.3 (H) 0.3 - 1.2 mg/dL Final   Hematology Recent Labs  Lab 10/16/17 0014 10/17/17 0514 10/18/17 0522  WBC 7.1 6.8 7.9  RBC 5.25* 5.06 5.39*  HGB 14.4 13.8 14.5  HCT 43.5 41.6 44.8  MCV 82.9 82.2 83.1  MCH 27.4 27.3 26.9  MCHC 33.1 33.2 32.4  RDW 18.3* 18.2* 18.6*  PLT 159 163 186   Cardiac EnzymesNo results for input(s): TROPONINI in the last 168 hours.  Recent Labs  Lab 10/16/17 0028  TROPIPOC 0.01    BNP Recent Labs  Lab 10/16/17 0014  BNP 699.6*    DDimer No results for input(s): DDIMER in the last 168 hours. TSH:  Lab Results  Component Value Date   TSH 2.187 09/05/2017   Lipids: Lab Results  Component Value Date   CHOL 116 11/12/2014   HDL 34 (L) 11/12/2014   LDLCALC 60 11/12/2014   TRIG 111 11/12/2014   CHOLHDL 3.4 11/12/2014   HgbA1c: Lab Results  Component Value Date   HGBA1C 5.9 (H) 11/12/2014    Radiology/Studies:  Dg Chest Port 1 View  Result Date: 10/16/2017 CLINICAL DATA:  Shortness of breath. EXAM: PORTABLE CHEST 1 VIEW COMPARISON:  Radiograph 09/21/2017, CT 07/22/2017 FINDINGS: Post median sternotomy and clipping of the left atrial appendage. Cardiomegaly is unchanged from prior exam. Unchanged mediastinal contours. Unchanged vascular congestion from prior exam. Linear scarring in the left lower lung zone. No confluent consolidation. No large pleural effusion. No pneumothorax. IMPRESSION: Unchanged cardiomegaly and vascular congestion. Electronically Signed   By: Jeb Levering M.D.   On: 10/16/2017 00:10    Assessment and Plan:   1.  Persistent atrial fibrillation with RVR: -Persistent atrial fibrillation with poorly controlled rates. This appears to be the fourth hospitalization for atrial fibrillation w/ RVR and acute on chronic CHF within the last 4 months -EP has denied the need for AV nodal ablation or Tikosyn therapy as of last hospitalization given her  location noncompliance -Currently heart rate is controlled on diltiazem 360 mg and Toprol XL 50 mg, rates in the low 100's -Will increase Toprol XL to 100mg  BID -Pt reports compliance with medications ?? >>discharged (09/2017) to SNF for approximately 10 days>>>now resides at home -Continue Xarelto -CHA2DS2 VASc score of 5   2.  Acute on chronic diastolic heart failure: -Last echocardiogram 12/15/2016 with a EF return to normal at 50-55% status post CABG -Consider repeating for updated evaluation given ongoing symptoms, medication noncompliance vs. worsening failure? -May need to repeat CXR for improvements in vascular congestion  -Weight, 195lb, was 200lb on admission 10/15/2017. She was 197lb at discharge early 09/2017 -I&O, net -3.3 L since admission, total output as of  today 10/18/2017 1.1L -Increase IV Lasix to 60mg  BID  3.  Chronic renal insufficiency stage II-III: -Cr, 0.97 today improved from her baseline of 1.2-1.4 -Continue to monitor and avoid nephrotoxic medications -Continue diuresing, as this seems to be improving her symptoms and renal function   4.  CAD status post CABG 2016: -Denies chest pain or associated ACS symptoms -Continue ASA, statin, beta-blocker  5.  HTN: -Stable, 140/85, 120/80, 120/87 -Continue current regimen  Will discuss plan with MD   For questions or updates, please contact [facility] Please consult [email or web address] for contact info under Cardiology/STEMI.   SignedKathyrn Drown NP-C [place] Pager: [phone] 10/18/2017 9:40 AM

## 2017-10-18 NOTE — Progress Notes (Addendum)
PROGRESS NOTE  ARAH ARO KVQ:[id number] DOB: Jul 16, 1946 DOA: 10/15/2017 PCP: [name], MD  HPI/Recap of past 24 hours:  She is sitting up on the edge of the bed, she remain oxygen dependent, heart rate is not well controlled Although she denies symptom  She complaining of right knee pain , gout flareup  Assessment/Plan: Principal Problem:   Atrial fibrillation with RVR ([place]) Active Problems:   Essential hypertension   Coronary artery disease due to lipid rich plaque   S/P CABG x 2   CKD (chronic kidney disease), stage III ([place])   Cardiomyopathy, ischemic   CHF exacerbation ([place])   Noncompliance  A. fib RVR Not well controlled, she has recurrent hospitalization for this since November 2018. Concern of medication noncompliance She is currently on Cardizem and beta-blocker and Xarelto Cardiology consulted  Acute hypoxic respiratory failure  acute on chronic diastolic CHF Lasix adjustment per cardiology Wean oxygen  She presented with hyperkalemia, K6.6 on admission, resolved Now hypokalemia on potassium supplement  CKD 2 Renal function stable at baseline, renal dosing meds Urine culture positive for greater than 100,000 colonies of Klebsiella pneumoniae, also has 80,000 colony of E. Coli We will empirically treat with Rocephin for now  CAD S/P CABG. Continue aspirin/statin/betablocker She is also on xarelto  Gout flareup -Start steroid, continue uloric -Check uric acid, right knee x ray  Code Status: full  Family Communication: patient   Disposition Plan: hopefully home in am with home health RN for medication supervision Will need to wean oxygen, may need to discharge home on o2 Need cardiology clearance for discharge.   Consultants:  Cardiology  Procedures:  None  Antibiotics:  As above   Objective: BP 140/85 (BP Location: Left Arm)   Pulse 84   Temp 97.8 F (36.6 C) (Oral)   Resp 16   Ht 5\' 7"  (1.702 m)   Wt 89.5 kg (197 lb 5  oz)   SpO2 94%   BMI 30.90 kg/m   Intake/Output Summary (Last 24 hours) at 10/18/2017 1146 Last data filed at 10/18/2017 0900 Gross per 24 hour  Intake 123 ml  Output 900 ml  Net -777 ml   Filed Weights   10/16/17 1754 10/17/17 0535 10/18/17 0511  Weight: 90.3 kg (199 lb) 88.6 kg (195 lb 5.2 oz) 89.5 kg (197 lb 5 oz)    Exam: Patient is examined daily including today on 10/18/2017, exams remain the same as of yesterday except that has changed    General:  NAD  Cardiovascular: IRRR  Respiratory: CTABL  Abdomen: Soft/ND/NT, positive BS  Musculoskeletal: No Edema  Neuro: alert, oriented   Data Reviewed: Basic Metabolic Panel: Recent Labs  Lab 10/16/17 0014 10/16/17 0221 10/17/17 0514 10/18/17 0522  NA 144  --  144 145  K 6.6* 5.3* 3.5 3.8  CL 117*  --  111 112*  CO2 19*  --  22 23  GLUCOSE 137*  --  116* 127*  BUN 14  --  15 15  CREATININE 1.10*  --  0.97 0.97  CALCIUM 9.0  --  8.7* 9.0  MG  --   --   --  1.7   Liver Function Tests: No results for input(s): AST, ALT, ALKPHOS, BILITOT, PROT, ALBUMIN in the last 168 hours. No results for input(s): LIPASE, AMYLASE in the last 168 hours. No results for input(s): AMMONIA in the last 168 hours. CBC: Recent Labs  Lab 10/16/17 0014 10/17/17 0514 10/18/17 0522  WBC 7.1 6.8 7.9  NEUTROABS 4.7  --   --   HGB 14.4 13.8 14.5  HCT 43.5 41.6 44.8  MCV 82.9 82.2 83.1  PLT 159 163 186   Cardiac Enzymes:   No results for input(s): CKTOTAL, CKMB, CKMBINDEX, TROPONINI in the last 168 hours. BNP (last 3 results) Recent Labs    08/04/17 1146 09/16/17 0142 10/16/17 0014  BNP 697.5* 318.5* 699.6*    ProBNP (last 3 results) No results for input(s): PROBNP in the last 8760 hours.  CBG: No results for input(s): GLUCAP in the last 168 hours.  Recent Results (from the past 240 hour(s))  Urine Culture     Status: Abnormal (Preliminary result)   Collection Time: 10/16/17  3:53 AM  Result Value Ref Range Status    Specimen Description   Final    URINE, CLEAN CATCH Performed at [facility], [place] [address], [place], [place] [address]    Special Requests   Final    NONE Performed at [facility], [place] [address], [place], [place] [address]    Culture (A)  Final    >=100,000 COLONIES/mL KLEBSIELLA PNEUMONIAE 80,000 COLONIES/mL ESCHERICHIA COLI    Report Status PENDING  Incomplete     Studies: No results found.  Scheduled Meds: . aspirin EC  81 mg Oral Daily  . atorvastatin  10 mg Oral Daily  . budesonide  0.5 mg Nebulization BID  . diltiazem  360 mg Oral Daily  . febuxostat  40 mg Oral Daily  . furosemide  40 mg Intravenous BID  . metoprolol succinate  50 mg Oral BID  . potassium chloride SA  40 mEq Oral BID  . predniSONE  20 mg Oral Q breakfast  . rivaroxaban  20 mg Oral Q supper  . sodium chloride flush  3 mL Intravenous Q12H    Continuous Infusions: . sodium chloride       Time spent: 35 mins, case discussed with cardiology I have personally reviewed and interpreted on  10/18/2017 daily labs, tele strips, imagings as discussed above under date review session and assessment and plans.  I reviewed all nursing notes, pharmacy notes, consultant notes,  vitals, pertinent old records  I have discussed plan of care as described above with RN , patient on 10/18/2017   Florencia Reasons MD, PhD  [facility] Pager [phone]. If 7PM-7AM, please contact night-coverage at [email or web address], password [facility] 10/18/2017, 11:46 AM  LOS: 2 days

## 2017-10-19 ENCOUNTER — Inpatient Hospital Stay (HOSPITAL_COMMUNITY): Payer: PPO

## 2017-10-19 ENCOUNTER — Encounter (HOSPITAL_COMMUNITY): Payer: Self-pay

## 2017-10-19 DIAGNOSIS — Z9119 Patient's noncompliance with other medical treatment and regimen: Secondary | ICD-10-CM

## 2017-10-19 DIAGNOSIS — I351 Nonrheumatic aortic (valve) insufficiency: Secondary | ICD-10-CM

## 2017-10-19 DIAGNOSIS — I2583 Coronary atherosclerosis due to lipid rich plaque: Secondary | ICD-10-CM

## 2017-10-19 DIAGNOSIS — I2510 Atherosclerotic heart disease of native coronary artery without angina pectoris: Secondary | ICD-10-CM

## 2017-10-19 LAB — BASIC METABOLIC PANEL
Anion gap: 13 (ref 5–15)
BUN: 18 mg/dL (ref 6–20)
CO2: 21 mmol/L — ABNORMAL LOW (ref 22–32)
Calcium: 9.2 mg/dL (ref 8.9–10.3)
Chloride: 108 mmol/L (ref 101–111)
Creatinine, Ser: 0.99 mg/dL (ref 0.44–1.00)
GFR calc Af Amer: >60 mL/min (ref >60)
GFR calc non Af Amer: 56 mL/min — ABNORMAL LOW (ref >60)
Glucose, Bld: 122 mg/dL — ABNORMAL HIGH (ref 65–99)
Potassium: 3.8 mmol/L (ref 3.5–5.1)
Sodium: 142 mmol/L (ref 135–145)

## 2017-10-19 LAB — CBC
HCT: 44.5 % (ref 36.0–46.0)
Hemoglobin: 14.5 g/dL (ref 12.0–15.0)
MCH: 27.1 pg (ref 26.0–34.0)
MCHC: 32.6 g/dL (ref 30.0–36.0)
MCV: 83 fL (ref 78.0–100.0)
Platelets: 217 10*3/uL (ref 150–400)
RBC: 5.36 MIL/uL — ABNORMAL HIGH (ref 3.87–5.11)
RDW: 18.6 % — ABNORMAL HIGH (ref 11.5–15.5)
WBC: 10.2 10*3/uL (ref 4.0–10.5)

## 2017-10-19 LAB — URINE CULTURE: Culture: >=100000 — AB

## 2017-10-19 LAB — ECHOCARDIOGRAM COMPLETE
Height: 67 in
Weight: 3082.91 oz

## 2017-10-19 LAB — URIC ACID: Uric Acid, Serum: 5.7 mg/dL (ref 2.3–6.6)

## 2017-10-19 MED ORDER — HYOSCYAMINE SULFATE 0.125 MG SL SUBL
0.2500 mg | SUBLINGUAL_TABLET | Freq: Once | SUBLINGUAL | Status: AC
  Administered 2017-10-19: 0.25 mg via SUBLINGUAL
  Filled 2017-10-19: qty 2

## 2017-10-19 MED ORDER — CEPHALEXIN 500 MG PO CAPS
500.0000 mg | ORAL_CAPSULE | Freq: Two times a day (BID) | ORAL | Status: DC
  Administered 2017-10-19 – 2017-10-20 (×2): 500 mg via ORAL
  Filled 2017-10-19 (×2): qty 1

## 2017-10-19 NOTE — Progress Notes (Signed)
Patient ambulated in hall with walker on RA at 94% and at rest patient O2 was 100% RA.

## 2017-10-19 NOTE — Consult Note (Signed)
   [facility] Inpatient Consult   10/19/2017  BRE PECINA 1945/12/06 [id number]   [facility] Care Management follow up.   Spoke with Mrs. Ferrera at bedside. Remains agreeable to [facility] Management follow up. Discussed [facility] will be contacting her post hospital discharge. Discussed [facility] Care Management will not interfere or replace her home health services. Mrs. Dadisman reports she will likely discharge home tomorrow.   Spoke with inpatient RNCM about above notes. Noted Mrs. Weismann will have home health with [facility] for RN and will be new to 02 as well.   Will update [facility] RNCM.    Marthenia Rolling, MSN-Ed, RN,BSN [facility] Liaison [phone]

## 2017-10-19 NOTE — Care Management Important Message (Signed)
Important Message  Patient Details  Name: [name] [name] MRN: [id number] Date of Birth: 1946-06-16   Medicare Important Message Given:  Yes    Kerin Salen 10/19/2017, 11:11 AMImportant Message  Patient Details  Name: [name] [name] MRN: [id number] Date of Birth: 1946/04/10   Medicare Important Message Given:  Yes    Kerin Salen 10/19/2017, 11:11 AM

## 2017-10-19 NOTE — Progress Notes (Signed)
  Echocardiogram 2D Echocardiogram has been performed.  Merrie Roof F 10/19/2017, 9:27 AM

## 2017-10-19 NOTE — Progress Notes (Signed)
Progress Note  Patient Name: [name] [name] Date of Encounter: 10/19/2017  Primary Cardiologist: Dr. Debara Pickett  Subjective   Pt denies chest pain and palpitations. She is resting comfortably in bed. She states she will need oxygen at home. Will monitor her oxygen sats on room air.   Inpatient Medications    Scheduled Meds: . aspirin EC  81 mg Oral Daily  . atorvastatin  10 mg Oral Daily  . budesonide  0.5 mg Nebulization BID  . diltiazem  360 mg Oral Daily  . febuxostat  40 mg Oral Daily  . furosemide  60 mg Intravenous BID  . metoprolol succinate  100 mg Oral BID  . potassium chloride SA  40 mEq Oral BID  . predniSONE  20 mg Oral Q breakfast  . rivaroxaban  20 mg Oral Q supper  . sodium chloride flush  3 mL Intravenous Q12H   Continuous Infusions: . sodium chloride    . cefTRIAXone (ROCEPHIN)  IV Stopped (10/18/17 2243)   PRN Meds: sodium chloride, acetaminophen, levalbuterol, sodium chloride flush   Vital Signs    Vitals:   10/18/17 2209 10/18/17 2212 10/19/17 0130 10/19/17 0453  BP: 138/84 138/84  136/86  Pulse: 90 90  88  Resp: 20   18  Temp: (!) 97.5 F (36.4 C)   97.6 F (36.4 C)  TempSrc: Oral   Oral  SpO2: 94%  95% 95%  Weight:    192 lb 10.9 oz (87.4 kg)  Height:        Intake/Output Summary (Last 24 hours) at 10/19/2017 0809 Last data filed at 10/19/2017 0600 Gross per 24 hour  Intake 463 ml  Output 1925 ml  Net -1462 ml   Filed Weights   10/17/17 0535 10/18/17 0511 10/19/17 0453  Weight: 195 lb 5.2 oz (88.6 kg) 197 lb 5 oz (89.5 kg) 192 lb 10.9 oz (87.4 kg)     Physical Exam   General: Well developed, well nourished, female appearing in no acute distress. Head: Normocephalic, atraumatic.  Neck: Supple without bruits, + JVD Lungs:  Resp regular and unlabored, diminished in bases Heart: irregular rhythm, improved rate, no murmur Abdomen: Soft, non-tender, non-distended with normoactive bowel sounds. No hepatomegaly. No rebound/guarding. No  obvious abdominal masses. Extremities: No clubbing, cyanosis, trace edema. Distal pedal pulses are 2+ bilaterally. Neuro: Alert and oriented X 3. Moves all extremities spontaneously. Psych: Normal affect.  Labs    Chemistry Recent Labs  Lab 10/17/17 0514 10/18/17 0522 10/19/17 0452  NA 144 145 142  K 3.5 3.8 3.8  CL 111 112* 108  CO2 22 23 21*  GLUCOSE 116* 127* 122*  BUN 15 15 18   CREATININE 0.97 0.97 0.99  CALCIUM 8.7* 9.0 9.2  GFRNONAA 57* 57* 56*  GFRAA >60 >60 >60  ANIONGAP 11 10 13      Hematology Recent Labs  Lab 10/17/17 0514 10/18/17 0522 10/19/17 0452  WBC 6.8 7.9 10.2  RBC 5.06 5.39* 5.36*  HGB 13.8 14.5 14.5  HCT 41.6 44.8 44.5  MCV 82.2 83.1 83.0  MCH 27.3 26.9 27.1  MCHC 33.2 32.4 32.6  RDW 18.2* 18.6* 18.6*  PLT 163 186 217    Cardiac EnzymesNo results for input(s): TROPONINI in the last 168 hours.  Recent Labs  Lab 10/16/17 0028  TROPIPOC 0.01     BNP Recent Labs  Lab 10/16/17 0014  BNP 699.6*     DDimer No results for input(s): DDIMER in the last 168 hours.   Radiology  No results found.   Telemetry    Afib with PVCs and rates in the 90-100s - Personally Reviewed  ECG    No new tracings - Personally Reviewed   Cardiac Studies   Echo 10/19/17 pending   Echo 01/11/17: Study Conclusions - Left ventricle: Posterior lateral hypokinesis. The cavity size   was normal. Systolic function was normal. The estimated ejection   fraction was in the range of 50% to 55%. Left ventricular   diastolic function parameters were normal. - Aortic valve: There was mild regurgitation. - Mitral valve: There was mild regurgitation. - Left atrium: The atrium was moderately dilated. - Atrial septum: No defect or patent foramen ovale was identified.   Patient Profile     72 y.o. female with a hx of CAD s/p CABG in 2016, ischemic cardiomyopathy with improvement in EF to normal s/p CABG, persistent atrial fibrillation w/ CHA2DS2 VASc score  of 5on Xarelto, s/p L AA clipping, HTN, CKD III and questionable medication compliance who is being seen for the evaluation of CHF exacerbation and atrial fibrillation with RVR.  Assessment & Plan    1. Atrial fibrillation with RVR Increased toprol to 100 mg BID yesterday. Rates have improved to the 90-100s. She denies palpitations. If LVEF remains normal, may consider adding diltiazem for better rate control. Continue xarelto.    2. Acute on chronic diastolic heart failure Echo pending to evaluate systolic function. Will continue lasix 60 mg IV BID. She is overall net negative 4.7 L with more than 2L urine output yesterday. Creatinine is stable, K WNL. She may need oxygen at discharge. Will not order until she is euvolemic and not requiring IV diuresis. Continue to monitor.   3. Chronic renal insufficiency stage II-III sCr 0.99 today. Continue diuresis as above. She is making good urine. I&Os likely not accurate due to bouts of incontinence that are not measurable.    4. CAD s/p CABG in 2016 No anginal symptoms. No ischemic evaluation at this time.   5. Essential HTN Pressure remain stable on current regimen.   Signed, Tami Lin Unknown Schleyer , PA-C 8:09 AM 10/19/2017 Pager: [phone]

## 2017-10-20 ENCOUNTER — Other Ambulatory Visit: Payer: Self-pay | Admitting: Physician Assistant

## 2017-10-20 ENCOUNTER — Telehealth: Payer: Self-pay | Admitting: Adult Health

## 2017-10-20 ENCOUNTER — Encounter (HOSPITAL_COMMUNITY): Payer: Self-pay

## 2017-10-20 DIAGNOSIS — I4891 Unspecified atrial fibrillation: Secondary | ICD-10-CM

## 2017-10-20 DIAGNOSIS — I5043 Acute on chronic combined systolic (congestive) and diastolic (congestive) heart failure: Secondary | ICD-10-CM

## 2017-10-20 LAB — CBC
HCT: 44 % (ref 36.0–46.0)
Hemoglobin: 14.3 g/dL (ref 12.0–15.0)
MCH: 27.1 pg (ref 26.0–34.0)
MCHC: 32.5 g/dL (ref 30.0–36.0)
MCV: 83.3 fL (ref 78.0–100.0)
Platelets: 214 10*3/uL (ref 150–400)
RBC: 5.28 MIL/uL — ABNORMAL HIGH (ref 3.87–5.11)
RDW: 18.8 % — ABNORMAL HIGH (ref 11.5–15.5)
WBC: 11.9 10*3/uL — ABNORMAL HIGH (ref 4.0–10.5)

## 2017-10-20 LAB — BASIC METABOLIC PANEL
Anion gap: 11 (ref 5–15)
BUN: 24 mg/dL — ABNORMAL HIGH (ref 6–20)
CO2: 21 mmol/L — ABNORMAL LOW (ref 22–32)
Calcium: 8.8 mg/dL — ABNORMAL LOW (ref 8.9–10.3)
Chloride: 110 mmol/L (ref 101–111)
Creatinine, Ser: 1.09 mg/dL — ABNORMAL HIGH (ref 0.44–1.00)
GFR calc Af Amer: 57 mL/min — ABNORMAL LOW (ref >60)
GFR calc non Af Amer: 49 mL/min — ABNORMAL LOW (ref >60)
Glucose, Bld: 120 mg/dL — ABNORMAL HIGH (ref 65–99)
Potassium: 3.9 mmol/L (ref 3.5–5.1)
Sodium: 142 mmol/L (ref 135–145)

## 2017-10-20 MED ORDER — DILTIAZEM HCL ER COATED BEADS 180 MG PO CP24: 360.0000 mg | ORAL_CAPSULE | Freq: Every day | ORAL | Status: DC

## 2017-10-20 MED ORDER — MAGNESIUM OXIDE 400 (241.3 MG) MG PO TABS: 400.0000 mg | ORAL_TABLET | Freq: Every day | ORAL | 0 refills | Status: DC

## 2017-10-20 MED ORDER — METOPROLOL SUCCINATE ER 100 MG PO TB24: 100.0000 mg | ORAL_TABLET | Freq: Two times a day (BID) | ORAL | 0 refills | Status: DC

## 2017-10-20 MED ORDER — DILTIAZEM HCL ER COATED BEADS 180 MG PO CP24: 180.0000 mg | ORAL_CAPSULE | Freq: Every day | ORAL | Status: DC

## 2017-10-20 MED ORDER — PREDNISONE 20 MG PO TABS: 20.0000 mg | ORAL_TABLET | Freq: Every day | ORAL | 0 refills | Status: AC

## 2017-10-20 MED ORDER — DIGOXIN 125 MCG PO TABS: 0.2500 mg | ORAL_TABLET | Freq: Once | ORAL | Status: DC

## 2017-10-20 MED ORDER — POTASSIUM CHLORIDE CRYS ER 20 MEQ PO TBCR: 40.0000 meq | EXTENDED_RELEASE_TABLET | Freq: Every day | ORAL | 0 refills | Status: DC

## 2017-10-20 MED ORDER — DIGOXIN 125 MCG PO TABS: 0.1250 mg | ORAL_TABLET | Freq: Once | ORAL | Status: DC

## 2017-10-20 MED ORDER — DIGOXIN 125 MCG PO TABS: 0.1250 mg | ORAL_TABLET | Freq: Every day | ORAL | 0 refills | Status: DC

## 2017-10-20 MED ORDER — FUROSEMIDE 40 MG PO TABS
60.0000 mg | ORAL_TABLET | Freq: Two times a day (BID) | ORAL | Status: DC
  Administered 2017-10-20 (×2): 60 mg via ORAL
  Filled 2017-10-20 (×2): qty 1

## 2017-10-20 MED ORDER — CLONAZEPAM 0.5 MG PO TABS: 0.2500 mg | ORAL_TABLET | Freq: Three times a day (TID) | ORAL | Status: DC | PRN

## 2017-10-20 MED ORDER — DIGOXIN 125 MCG PO TABS: 0.2500 mg | ORAL_TABLET | Freq: Every day | ORAL | Status: DC

## 2017-10-20 MED ORDER — ASPIRIN 81 MG PO TBEC: 81.0000 mg | DELAYED_RELEASE_TABLET | Freq: Every day | ORAL | 0 refills | Status: DC

## 2017-10-20 MED ORDER — CEPHALEXIN 500 MG PO CAPS: 500.0000 mg | ORAL_CAPSULE | Freq: Two times a day (BID) | ORAL | 0 refills | Status: AC

## 2017-10-20 MED ORDER — DIGOXIN 125 MCG PO TABS: 0.1250 mg | ORAL_TABLET | Freq: Every day | ORAL | Status: DC

## 2017-10-20 MED ORDER — DIGOXIN 125 MCG PO TABS
0.2500 mg | ORAL_TABLET | Freq: Every day | ORAL | Status: AC
  Administered 2017-10-20: 0.25 mg via ORAL
  Filled 2017-10-20: qty 2

## 2017-10-20 NOTE — Progress Notes (Signed)
PROGRESS NOTE  [name] [name] NOB:[id number] DOB: 1945-09-27 DOA: 10/15/2017 PCP: [name], MD  HPI/Recap of past 24 hours:  She just has echo done this ma Breathing better, reports dysuria when she first came into the hospital, that has resolved She complaining of right knee pain , gout flareup  Assessment/Plan: Principal Problem:   Atrial fibrillation with RVR ([place]) Active Problems:   Essential hypertension   Coronary artery disease due to lipid rich plaque   S/P CABG x 2   CKD (chronic kidney disease), stage III ([place])   Cardiomyopathy, ischemic   CHF exacerbation ([place])   Noncompliance  A. fib RVR Not well controlled, she has recurrent hospitalization for this since November 2018. Concern of medication noncompliance She is currently on Cardizem and beta-blocker and Xarelto Cardiology consulted  Acute hypoxic respiratory failure  acute on chronic diastolic CHF Echo pending Lasix adjustment per cardiology Wean oxygen  She presented with hyperkalemia, K6.6 on admission, resolved Now hypokalemia on potassium supplement  CAD S/P CABG. Continue aspirin/statin/betablocker She is also on xarelto  CKD 2 Renal function stable at baseline, renal dosing meds Urine culture positive for greater than 100,000 colonies of Klebsiella pneumoniae, also has 80,000 colony of E. Coli She reports dysuria when she first came into the hospital, symptom has resolved. Rocephin started on 3/7, change to keflex per sensitivity    Gout flareup vs osteoarthritis -Start steroid, continue uloric -uric acid 5.7, right knee x ray no acute findings  Code Status: full  Family Communication: patient   Disposition Plan: hopefully home in am with home health RN for medication supervision Will need to wean oxygen, may need to discharge home on o2 Need cardiology clearance for discharge.   Consultants:  Cardiology  Procedures:  None  Antibiotics:  As above   Objective: BP (!)  140/96 (BP Location: Left Arm)   Pulse 100   Temp 97.7 F (36.5 C) (Oral)   Resp (!) 22   Ht 5\' 7"  (1.702 m)   Wt 87.3 kg (192 lb 7.4 oz)   SpO2 96%   BMI 30.14 kg/m   Intake/Output Summary (Last 24 hours) at 10/20/2017 0742 Last data filed at 10/20/2017 0115 Gross per 24 hour  Intake 600 ml  Output 930 ml  Net -330 ml   Filed Weights   10/18/17 0511 10/19/17 0453 10/20/17 0407  Weight: 89.5 kg (197 lb 5 oz) 87.4 kg (192 lb 10.9 oz) 87.3 kg (192 lb 7.4 oz)    Exam: Patient is examined daily including today on 3/7, exams remain the same as of yesterday except that has changed    General:  NAD  Cardiovascular: IRRR  Respiratory: CTABL  Abdomen: Soft/ND/NT, positive BS  Musculoskeletal: No Edema  Neuro: alert, oriented   Data Reviewed: Basic Metabolic Panel: Recent Labs  Lab 10/16/17 0014 10/16/17 0221 10/17/17 0514 10/18/17 0522 10/19/17 0452 10/20/17 0530  NA 144  --  144 145 142 142  K 6.6* 5.3* 3.5 3.8 3.8 3.9  CL 117*  --  111 112* 108 110  CO2 19*  --  22 23 21* 21*  GLUCOSE 137*  --  116* 127* 122* 120*  BUN 14  --  15 15 18  24*  CREATININE 1.10*  --  0.97 0.97 0.99 1.09*  CALCIUM 9.0  --  8.7* 9.0 9.2 8.8*  MG  --   --   --  1.7  --   --    Liver Function Tests: No results for  input(s): AST, ALT, ALKPHOS, BILITOT, PROT, ALBUMIN in the last 168 hours. No results for input(s): LIPASE, AMYLASE in the last 168 hours. No results for input(s): AMMONIA in the last 168 hours. CBC: Recent Labs  Lab 10/16/17 0014 10/17/17 0514 10/18/17 0522 10/19/17 0452 10/20/17 0530  WBC 7.1 6.8 7.9 10.2 11.9*  NEUTROABS 4.7  --   --   --   --   HGB 14.4 13.8 14.5 14.5 14.3  HCT 43.5 41.6 44.8 44.5 44.0  MCV 82.9 82.2 83.1 83.0 83.3  PLT 159 163 186 217 214   Cardiac Enzymes:   No results for input(s): CKTOTAL, CKMB, CKMBINDEX, TROPONINI in the last 168 hours. BNP (last 3 results) Recent Labs    08/04/17 1146 09/16/17 0142 10/16/17 0014  BNP 697.5* 318.5*  699.6*    ProBNP (last 3 results) No results for input(s): PROBNP in the last 8760 hours.  CBG: No results for input(s): GLUCAP in the last 168 hours.  Recent Results (from the past 240 hour(s))  Urine Culture     Status: Abnormal   Collection Time: 10/16/17  3:53 AM  Result Value Ref Range Status   Specimen Description   Final    URINE, CLEAN CATCH Performed at [facility], [place] [address], [place], [place] [address]    Special Requests   Final    NONE Performed at [facility], [place] [address], [place], [place] [address]    Culture (A)  Final    >=100,000 COLONIES/mL KLEBSIELLA PNEUMONIAE 80,000 COLONIES/mL ESCHERICHIA COLI    Report Status 10/19/2017 FINAL  Final   Organism ID, Bacteria KLEBSIELLA PNEUMONIAE (A)  Final   Organism ID, Bacteria ESCHERICHIA COLI (A)  Final      Susceptibility   Escherichia coli - MIC*    AMPICILLIN >=32 RESISTANT Resistant     CEFAZOLIN <=4 SENSITIVE Sensitive     CEFTRIAXONE <=1 SENSITIVE Sensitive     CIPROFLOXACIN <=0.25 SENSITIVE Sensitive     GENTAMICIN <=1 SENSITIVE Sensitive     IMIPENEM <=0.25 SENSITIVE Sensitive     NITROFURANTOIN <=16 SENSITIVE Sensitive     TRIMETH/SULFA <=20 SENSITIVE Sensitive     AMPICILLIN/SULBACTAM 16 INTERMEDIATE Intermediate     PIP/TAZO <=4 SENSITIVE Sensitive     Extended ESBL NEGATIVE Sensitive     * 80,000 COLONIES/mL ESCHERICHIA COLI   Klebsiella pneumoniae - MIC*    AMPICILLIN >=32 RESISTANT Resistant     CEFAZOLIN <=4 SENSITIVE Sensitive     CEFTRIAXONE <=1 SENSITIVE Sensitive     CIPROFLOXACIN <=0.25 SENSITIVE Sensitive     GENTAMICIN <=1 SENSITIVE Sensitive     IMIPENEM <=0.25 SENSITIVE Sensitive     NITROFURANTOIN 32 SENSITIVE Sensitive     TRIMETH/SULFA <=20 SENSITIVE Sensitive     AMPICILLIN/SULBACTAM 4 SENSITIVE Sensitive     PIP/TAZO <=4 SENSITIVE Sensitive     Extended ESBL NEGATIVE Sensitive     * >=100,000 COLONIES/mL KLEBSIELLA PNEUMONIAE      Studies: Dg Knee 1-2 Views Right  Result Date: 10/19/2017 CLINICAL DATA:  Anterior knee pain for 2 days. No known injury. Initial encounter. EXAM: RIGHT KNEE - 1-2 VIEW COMPARISON:  07/23/2015 radiographs FINDINGS: No acute fracture, subluxation, dislocation or knee effusion. No erosive changes are present. Mild degenerative tibial spine spurring again noted. No suspicious focal bony lesions noted. IMPRESSION: No acute abnormality or significant change from 2016. Electronically Signed   By: Margarette Canada M.D.   On: 10/19/2017 09:47    Scheduled Meds: .  aspirin EC  81 mg Oral Daily  . atorvastatin  10 mg Oral Daily  . budesonide  0.5 mg Nebulization BID  . cephALEXin  500 mg Oral Q12H  . diltiazem  360 mg Oral Daily  . febuxostat  40 mg Oral Daily  . furosemide  60 mg Intravenous BID  . metoprolol succinate  100 mg Oral BID  . potassium chloride SA  40 mEq Oral BID  . predniSONE  20 mg Oral Q breakfast  . rivaroxaban  20 mg Oral Q supper  . sodium chloride flush  3 mL Intravenous Q12H    Continuous Infusions: . sodium chloride       Time spent: 35 mins, case discussed with cardiology I have personally reviewed and interpreted on  3/7 daily labs, tele strips, imagings as discussed above under date review session and assessment and plans.  I reviewed all nursing notes, pharmacy notes, consultant notes,  vitals, pertinent old records  I have discussed plan of care as described above with RN , patient on 3/7   Florencia Reasons MD, PhD  [facility] Pager [phone]. If 7PM-7AM, please contact night-coverage at [email or web address], password [email or web address] 10/20/2017, 7:42 AM  LOS: 4 days

## 2017-10-20 NOTE — Telephone Encounter (Signed)
TOC Patient-Please call Patient-Pt has appointment on 11-02-17 with Jory Sims

## 2017-10-20 NOTE — Telephone Encounter (Signed)
Current admit 

## 2017-10-20 NOTE — Progress Notes (Signed)
Patient educated on discharge summary as well as medications to start and to continue daily. Patient reinstated her medication list and how to take them saying " she would try her best". I educated her on taking her blood pressure daily along with daily weight. Patient stated she understood. Family at [facility]. All medications obtained from pharmacy. Wheeled out by tech.

## 2017-10-20 NOTE — Progress Notes (Signed)
Progress Note  Patient Name: [name] [name] Date of Encounter: 10/20/2017  Primary Cardiologist: Dr. Debara Pickett  Subjective   Pt is on room air and talks about controlling her panic attacks that come from her stomach. She appears euvolemic on exam.  Inpatient Medications    Scheduled Meds: . aspirin EC  81 mg Oral Daily  . atorvastatin  10 mg Oral Daily  . budesonide  0.5 mg Nebulization BID  . cephALEXin  500 mg Oral Q12H  . diltiazem  360 mg Oral Daily  . febuxostat  40 mg Oral Daily  . furosemide  60 mg Intravenous BID  . metoprolol succinate  100 mg Oral BID  . potassium chloride SA  40 mEq Oral BID  . predniSONE  20 mg Oral Q breakfast  . rivaroxaban  20 mg Oral Q supper  . sodium chloride flush  3 mL Intravenous Q12H   Continuous Infusions: . sodium chloride     PRN Meds: sodium chloride, acetaminophen, levalbuterol, sodium chloride flush   Vital Signs    Vitals:   10/20/17 0110 10/20/17 0215 10/20/17 0403 10/20/17 0407  BP: (!) 162/110 (!) 136/103 (!) 140/96   Pulse: (!) 110 99 100   Resp:   (!) 22   Temp:   97.7 F (36.5 C)   TempSrc:   Oral   SpO2: 95% 97% 96%   Weight:    192 lb 7.4 oz (87.3 kg)  Height:        Intake/Output Summary (Last 24 hours) at 10/20/2017 0730 Last data filed at 10/20/2017 0115 Gross per 24 hour  Intake 600 ml  Output 930 ml  Net -330 ml   Filed Weights   10/18/17 0511 10/19/17 0453 10/20/17 0407  Weight: 197 lb 5 oz (89.5 kg) 192 lb 10.9 oz (87.4 kg) 192 lb 7.4 oz (87.3 kg)     Physical Exam   General: Well developed, well nourished, female appearing in no acute distress. Head: Normocephalic, atraumatic.  Neck: Supple without bruits, no JVD Lungs:  Resp regular and unlabored, CTA, but shallow breaths Heart: irregular rhythm, regular rate Abdomen: Soft, non-tender, non-distended with normoactive bowel sounds. No hepatomegaly. No rebound/guarding. No obvious abdominal masses. Extremities: No clubbing, cyanosis, trace edema.  Distal pedal pulses are 2+ bilaterally. Neuro: Alert and oriented X 3. Moves all extremities spontaneously. Psych: Normal affect.  Labs    Chemistry Recent Labs  Lab 10/18/17 0522 10/19/17 0452 10/20/17 0530  NA 145 142 142  K 3.8 3.8 3.9  CL 112* 108 110  CO2 23 21* 21*  GLUCOSE 127* 122* 120*  BUN 15 18 24*  CREATININE 0.97 0.99 1.09*  CALCIUM 9.0 9.2 8.8*  GFRNONAA 57* 56* 49*  GFRAA >60 >60 57*  ANIONGAP 10 13 11      Hematology Recent Labs  Lab 10/18/17 0522 10/19/17 0452 10/20/17 0530  WBC 7.9 10.2 11.9*  RBC 5.39* 5.36* 5.28*  HGB 14.5 14.5 14.3  HCT 44.8 44.5 44.0  MCV 83.1 83.0 83.3  MCH 26.9 27.1 27.1  MCHC 32.4 32.6 32.5  RDW 18.6* 18.6* 18.8*  PLT 186 217 214    Cardiac EnzymesNo results for input(s): TROPONINI in the last 168 hours.  Recent Labs  Lab 10/16/17 0028  TROPIPOC 0.01     BNP Recent Labs  Lab 10/16/17 0014  BNP 699.6*     DDimer No results for input(s): DDIMER in the last 168 hours.   Radiology    Dg Knee 1-2 Views Right  Result Date: 10/19/2017 CLINICAL DATA:  Anterior knee pain for 2 days. No known injury. Initial encounter. EXAM: RIGHT KNEE - 1-2 VIEW COMPARISON:  07/23/2015 radiographs FINDINGS: No acute fracture, subluxation, dislocation or knee effusion. No erosive changes are present. Mild degenerative tibial spine spurring again noted. No suspicious focal bony lesions noted. IMPRESSION: No acute abnormality or significant change from 2016. Electronically Signed   By: Margarette Canada M.D.   On: 10/19/2017 09:47     Telemetry    Afib in the 100s - Personally Reviewed  ECG    No new tracings - Personally Reviewed  Cardiac Studies   Echo 10/19/17  Study Conclusions - Left ventricle: The cavity size was normal. Wall thickness was   increased in a pattern of moderate LVH. There was mild focal   basal hypertrophy of the septum. Systolic function was moderately   to severely reduced. The estimated ejection fraction was  in the   range of 30% to 35%. Diffuse hypokinesis. - Aortic valve: There was mild regurgitation. - Mitral valve: Calcified annulus. There was moderate   regurgitation. - Left atrium: The atrium was severely dilated. - Right ventricle: The cavity size was moderately dilated. Systolic   function was moderately reduced. - Right atrium: The atrium was severely dilated. - Pulmonary arteries: Systolic pressure was mildly increased. PA   peak pressure: 50 mm Hg (S).  Impressions: - Moderate to severe global reduction in LV systolic function;   moderate LVH; mild AI; moderate MR; severe biatrial enlargement;   moderate RVE with moderate RV dysfunction; mild TR; mild   pulmonary hypertension.   Echo 01/11/17: Study Conclusions - Left ventricle: Posterior lateral hypokinesis. The cavity size was normal. Systolic function was normal. The estimated ejection fraction was in the range of 50% to 55%. Left ventricular diastolic function parameters were normal. - Aortic valve: There was mild regurgitation. - Mitral valve: There was mild regurgitation. - Left atrium: The atrium was moderately dilated. - Atrial septum: No defect or patent foramen ovale was identified.   Patient Profile     72 y.o. female with a hx of CAD s/pCABG in 2016, ischemic cardiomyopathy with improvement in EF to normal s/pCABG, persistent atrial fibrillation w/CHA2DS2 VASc score of 5on Xarelto,s/pL AA clipping, HTN, CKDIIIand questionable medication compliancewho is being seen for the evaluation ofCHF exacerbation and atrial fibrillation with RVR.  Assessment & Plan    1. Atrial fibrillation with RVR Given her reduced EF will wean diltiazem as this is not a good medicine for her. Will consider the addition of digoxin for better rate control - discuss with attending. Continue xarelto.   2. Acute on chronic systolic and diastolic heart failure Echo yesterday with reduced EF of 30-35% and moderate LVH  and severely dilated left atrium. She continues to diurese on 60 mg IV lasix BID. She is overall net negative 5L with several urine occurrences yesterday, I&Os not complete.  She appears euvolemic on exam and is on room air. Will transition lasix to PO dosing today. Continue potassium supplementation.   3. Chronic renal insufficiency stage II-III sCr 1.09 (.99). Her baseline appears to be 1.2-1.4. Continue with diuresis as above. She is making urine.   4. CAD s/p CABG in 2016 No anginal symptoms, no ischemic workup.   5. Essential hypertension Pressures elevated overnight. Will start losartan for pressure control. If additional agents needed, would consider norvasc.     Signed, Tami Lin Ell Tiso , PA-C 7:30 AM 10/20/2017 Pager: [phone]

## 2017-10-20 NOTE — Care Management Note (Signed)
Case Management Note  Patient Details  Name: [name] [name] MRN: [id number] Date of Birth: 1945-08-29  Subjective/Objective: Patient w/memory issues deferred to talk to dtr-Spoke to patient's dtr-[name] of [facility] services(intermittent), & High Risk Initiative(HRI) [name] will provide HRI-dtr voiced understanding. I have contacted [facility]-[name] to inform of [name] w/[facility]-she will d/c services.[facility] also active.No home 02 needed @ d/c. No further CM needs.                   Action/Plan:d/c home w/HHC/HRI/[facility].   Expected Discharge Date:  (unknown)               Expected Discharge Plan:  [place]  In-House Referral:     Discharge planning Services  CM Consult  Post Acute Care Choice:  Home Health(Active w/[facility]-but will not provide HHC since [place] w/[facility]) Choice offered to:     DME Arranged:    DME Agency:     HH Arranged:  RN, Social Work [employer] Agency:  [place]  Status of Service:  Completed, signed off  If discussed at [employer] of Stay Meetings, dates discussed:    Additional Comments:  Dessa Phi, RN 10/20/2017, 11:05 AM

## 2017-10-20 NOTE — Discharge Summary (Signed)
Discharge Summary  [name] [name] YJE:[id number] DOB: October 28, 1945  PCP: [name], MD  Admit date: 10/15/2017 Discharge date: 10/20/2017  Time spent: >60mins, more than 50% time spent on coordination of care.  Recommendations for Outpatient Follow-up:  1. F/u with PMD within a week  for hospital discharge follow up, repeat cbc/bmp at follow up. 2. F/u with cardiology, cardiology to monitor dig level. Further dig prescription per cardiology ( total of 15 tabs prescribed at discharge) 3. F/u with neurology for impaired memory 4. Home health arranged  Discharge Diagnoses:  Active Hospital Problems   Diagnosis Date Noted  . Atrial fibrillation with RVR ([facility])   . Acute on chronic combined systolic and diastolic CHF (congestive heart failure) ([place])   . CHF exacerbation ([facility]) 10/16/2017  . Noncompliance 10/16/2017  . Cardiomyopathy, ischemic 02/08/2017  . CKD (chronic kidney disease), stage III ([place]) 01/30/2017  . S/P CABG x 2 11/14/2014  . Coronary artery disease due to lipid rich plaque   . Essential hypertension     Resolved Hospital Problems  No resolved problems to display.    Discharge Condition: stable  Diet recommendation: heart healthy  Filed Weights   10/18/17 0511 10/19/17 0453 10/20/17 0407  Weight: 89.5 kg (197 lb 5 oz) 87.4 kg (192 lb 10.9 oz) 87.3 kg (192 lb 7.4 oz)    History of present illness: (per admitting MD Dr Shanon Brow)  PCP: [name], MD  Patient coming from: Home  Chief Complaint: Heart rate high  HPI: [name] [name] is a 72 y.o. female with medical history significant of A. fib on Xarelto, hypertension, coronary artery disease, chronic kidney disease comes in because she feels like her heart rate is high.  Patient reports that she is taking her medications as prescribed although at her previous cardiology notes noncompliance has been a main issue with her.  She lives alone and she has dementia noted in her chart however not getting a good idea  that she has a history of dementia with talking to her.  She reports no recent illnesses.  She reports no nausea vomiting or diarrhea.  She denies any chest pain.  She reports that she has been short of breath and that is how she usually knows when her rate is out of whack.  Patient indeed found to be in A. fib with RVR and was given a IV dose of diltiazem and oral load last night in the ED.  Her rate has been around 100 or less since arrival to ED.  Patient is being referred for admission for A. fib with RVR   Hospital Course:  Principal Problem:   Atrial fibrillation with RVR ([facility]) Active Problems:   Essential hypertension   Coronary artery disease due to lipid rich plaque   S/P CABG x 2   CKD (chronic kidney disease), stage III (HCC)   Cardiomyopathy, ischemic   CHF exacerbation (HCC)   Noncompliance   Acute on chronic combined systolic and diastolic CHF (congestive heart failure) (HCC)   A. fib RVR -she has recurrent hospitalization for this since November 2018. -Concern of medication noncompliance due to memory issues -She is currently on Cardizem, beta-blocker , dig and Xarelto per cardiology recommendation.  -heart rate better controlled -she is cleared to discharge home with close Cardiology follow up.  Acute hypoxic respiratory failure  acute on chronic combined diastolic and systolic CHF Echo with reduced EF Lasix adjustment per cardiology She is weaned off oxygen She is euvolemic at discharge. No edema.  Lung clear.  She presented with hyperkalemia, K6.6 on admission, resolved then hypokalemia on potassium supplement k wnl at discharge.  CAD S/P CABG. Continue aspirin/statin/betablocker She is also on xarelto  CKD 2 Renal function stable at baseline, renal dosing meds UTI/ Urine culture positive for greater than 100,000 colonies of Klebsiella pneumoniae, also has 80,000 colony of E. Coli She reports dysuria when she first came into the hospital, symptom has  resolved. Rocephin started on 3/7, change to keflex per sensitivity, she is discharged on keflex to finish abx treatment.   Gout flareup vs osteoarthritis -Start steroid, continue uloric -uric acid 5.7, right knee x ray no acute findings -improved.  Impaired memory:  Advise neurology follow up.  Code Status: full  Family Communication: patient in room, daughter Olin Hauser ( from [employer]) over the phone.  Disposition Plan:  home with home health RN for medication supervision Patient is cleared by cardiology for discharge.   Consultants:  Cardiology  Procedures:  None  Antibiotics:  As above   Discharge Exam: BP (!) 137/96 (BP Location: Right Arm)   Pulse 97   Temp 98.1 F (36.7 C) (Oral)   Resp (!) 25   Ht 5\' 7"  (1.702 m)   Wt 87.3 kg (192 lb 7.4 oz)   SpO2 98%   BMI 30.14 kg/m   General: NAD, AAox3, poor short term memory Cardiovascular: IRRR Respiratory: CTABL  Discharge Instructions You were cared for by a hospitalist during your hospital stay. If you have any questions about your discharge medications or the care you received while you were in the hospital after you are discharged, you can call the unit and asked to speak with the hospitalist on call if the hospitalist that took care of you is not available. Once you are discharged, your primary care physician will handle any further medical issues. Please note that NO REFILLS for any discharge medications will be authorized once you are discharged, as it is imperative that you return to your primary care physician (or establish a relationship with a primary care physician if you do not have one) for your aftercare needs so that they can reassess your need for medications and monitor your lab values.  Discharge Instructions    AMB Referral to [facility] Management   Complete by:  As directed    Please assign HTA member to [facility] for transition of care and for disease management for CHF. High  risk for readmission. Please assess need for referral for [facility] LCSW for transportation needs. Written consent on file. Currently at [facility]. Thanks. Marthenia Rolling, [place], RN,BSN-[facility] [phone]   Reason for consult:  Please assign to [facility] RNCM   Diagnoses of:  Heart Failure   Expected date of contact:  1-3 days (reserved for hospital discharges)   Diet - low sodium heart healthy   Complete by:  As directed    Increase activity slowly   Complete by:  As directed      Allergies as of 10/20/2017      Reactions   Bee Venom Anaphylaxis   Codeine Nausea And Vomiting   Shrimp [shellfish Allergy] Swelling   Tomato    Pt states it gives her gout      Medication List    TAKE these medications   acetaminophen 500 MG tablet Commonly known as:  TYLENOL Take 500 mg by mouth every 6 (six) hours as needed for moderate pain.   aspirin 81 MG EC tablet  Take 1 tablet (81 mg total) by mouth daily. Start taking on:  10/21/2017   atorvastatin 10 MG tablet Commonly known as:  LIPITOR Take 10 mg by mouth daily.   benzonatate 100 MG capsule Commonly known as:  TESSALON PERLES Take 1 capsule (100 mg total) by mouth every 6 (six) hours as needed for cough.   budesonide 0.5 MG/2ML nebulizer solution Commonly known as:  PULMICORT Take 2 mLs (0.5 mg total) by nebulization 2 (two) times daily.   cephALEXin 500 MG capsule Commonly known as:  KEFLEX Take 1 capsule (500 mg total) by mouth every 12 (twelve) hours for 2 days.   digoxin 0.125 MG tablet Commonly known as:  LANOXIN Take 1 tablet (0.125 mg total) by mouth daily for 15 days. Please follow up with cardiology for refills if further dose needed. Start taking on:  10/21/2017   diltiazem 360 MG 24 hr capsule Commonly known as:  CARDIZEM CD Take 1 capsule (360 mg total) by mouth daily.   febuxostat 40 MG tablet Commonly known as:  [name] Take 40 mg by mouth daily.   fluticasone 50 MCG/ACT nasal  spray Commonly known as:  FLONASE Place 1 spray into both nostrils daily. What changed:    when to take this  reasons to take this   furosemide 40 MG tablet Commonly known as:  LASIX Take 60 mg by mouth 2 (two) times daily.   guaiFENesin 600 MG 12 hr tablet Commonly known as:  MUCINEX Take 1 tablet (600 mg total) by mouth 2 (two) times daily.   levalbuterol 0.63 MG/3ML nebulizer solution Commonly known as:  XOPENEX Take 3 mLs (0.63 mg total) by nebulization every 6 (six) hours as needed for wheezing or shortness of breath.   magnesium oxide 400 (241.3 Mg) MG tablet Commonly known as:  MAG-OX Take 1 tablet (400 mg total) by mouth daily. What changed:  when to take this   metoprolol succinate 100 MG 24 hr tablet Commonly known as:  TOPROL-XL Take 1 tablet (100 mg total) by mouth 2 (two) times daily. Take with or immediately following a meal. What changed:    medication strength  how much to take   pantoprazole 40 MG tablet Commonly known as:  PROTONIX Take 1 tablet (40 mg total) by mouth daily at 12 noon.   potassium chloride SA 20 MEQ tablet Commonly known as:  K-DUR,KLOR-CON Take 2 tablets (40 mEq total) by mouth daily. What changed:  when to take this   predniSONE 20 MG tablet Commonly known as:  DELTASONE Take 1 tablet (20 mg total) by mouth daily with breakfast for 2 days. Start taking on:  10/21/2017 What changed:    medication strength  how much to take  how to take this  when to take this  additional instructions   ranitidine 150 MG capsule Commonly known as:  ZANTAC Take 150 mg by mouth 2 (two) times daily.   rivaroxaban 20 MG Tabs tablet Commonly known as:  XARELTO Take 1 tablet (20 mg total) by mouth daily with supper.   tiZANidine 2 MG tablet Commonly known as:  ZANAFLEX Take 2 mg by mouth daily as needed for muscle spasms.      Allergies  Allergen Reactions  . Bee Venom Anaphylaxis  . Codeine Nausea And Vomiting  . Shrimp  [Shellfish Allergy] Swelling  . Tomato     Pt states it gives her gout   Follow-up Information    Lendon Colonel, NP Follow up on  10/30/2017.   Specialties:  Nurse Practitioner, Radiology, Cardiology Why:  9:15 TCM appt Contact information: [address] [place] [address] [phone]        [facility] Follow up.   Specialty:  [place] Why:  High Risk Initiative-HH nursing, social worker Contact information: [place] [address] [phone]        [name], MD Follow up in 1 week(s).   Specialty:  Internal Medicine Why:  hospital discharge follow up, repeat cbc/bmp at follow up. Contact information: [address] [address] [phone]        [name], MD Follow up.   Specialty:  Cardiology Contact information: [place] [address] [place] Follow up on 10/27/2017.   Specialty:  Cardiology Why:  lab draw at [facility] office for digoxin level Contact information: [place] Follow up in 1 month(s).   Why:  for impaired memory  Contact information: [address]     [place] [id number] [phone]           The results of significant diagnostics from this hospitalization (including imaging, microbiology, ancillary and laboratory) are listed below for reference.    Significant Diagnostic Studies: Dg Knee 1-2 Views Right  Result Date: 10/19/2017 CLINICAL DATA:  Anterior knee pain for 2 days. No known injury. Initial encounter. EXAM: RIGHT KNEE - 1-2 VIEW COMPARISON:  07/23/2015 radiographs FINDINGS: No acute fracture, subluxation, dislocation or knee effusion. No erosive changes are present. Mild degenerative tibial spine spurring again  noted. No suspicious focal bony lesions noted. IMPRESSION: No acute abnormality or significant change from 2016. Electronically Signed   By: Margarette Canada M.D.   On: 10/19/2017 09:47   Dg Chest Port 1 View  Result Date: 10/16/2017 CLINICAL DATA:  Shortness of breath. EXAM: PORTABLE CHEST 1 VIEW COMPARISON:  Radiograph 09/21/2017, CT 07/22/2017 FINDINGS: Post median sternotomy and clipping of the left atrial appendage. Cardiomegaly is unchanged from prior exam. Unchanged mediastinal contours. Unchanged vascular congestion from prior exam. Linear scarring in the left lower lung zone. No confluent consolidation. No large pleural effusion. No pneumothorax. IMPRESSION: Unchanged cardiomegaly and vascular congestion. Electronically Signed   By: Jeb Levering M.D.   On: 10/16/2017 00:10   Dg Chest Port 1 View  Result Date: 09/21/2017 CLINICAL DATA:  Tachycardia, shortness of breath, and wheezing. EXAM: PORTABLE CHEST 1 VIEW COMPARISON:  Chest x-ray dated September 19, 2017. FINDINGS: Stable cardiomegaly status post CABG and left atrial appendage clipping. Mild vascular congestion is unchanged. Stable lingular atelectasis/scarring. No consolidation, pleural effusion, or pneumothorax. No acute osseous abnormality. IMPRESSION: Stable cardiomegaly and vascular congestion. Electronically Signed   By: Titus Dubin M.D.   On: 09/21/2017 10:46    Microbiology: Recent Results (from the past 240 hour(s))  Urine Culture     Status: Abnormal   Collection Time: 10/16/17  3:53 AM  Result Value Ref Range Status   Specimen Description   Final    URINE, CLEAN CATCH Performed at [facility], [place] [address] [place], [place] [address]    Special Requests   Final    NONE Performed at [facility], [place] [address], [place], [place] [address]    Culture (A)  Final    >=  100,000 COLONIES/mL KLEBSIELLA PNEUMONIAE 80,000 COLONIES/mL ESCHERICHIA COLI    Report Status 10/19/2017 FINAL   Final   Organism ID, Bacteria KLEBSIELLA PNEUMONIAE (A)  Final   Organism ID, Bacteria ESCHERICHIA COLI (A)  Final      Susceptibility   Escherichia coli - MIC*    AMPICILLIN >=32 RESISTANT Resistant     CEFAZOLIN <=4 SENSITIVE Sensitive     CEFTRIAXONE <=1 SENSITIVE Sensitive     CIPROFLOXACIN <=0.25 SENSITIVE Sensitive     GENTAMICIN <=1 SENSITIVE Sensitive     IMIPENEM <=0.25 SENSITIVE Sensitive     NITROFURANTOIN <=16 SENSITIVE Sensitive     TRIMETH/SULFA <=20 SENSITIVE Sensitive     AMPICILLIN/SULBACTAM 16 INTERMEDIATE Intermediate     PIP/TAZO <=4 SENSITIVE Sensitive     Extended ESBL NEGATIVE Sensitive     * 80,000 COLONIES/mL ESCHERICHIA COLI   Klebsiella pneumoniae - MIC*    AMPICILLIN >=32 RESISTANT Resistant     CEFAZOLIN <=4 SENSITIVE Sensitive     CEFTRIAXONE <=1 SENSITIVE Sensitive     CIPROFLOXACIN <=0.25 SENSITIVE Sensitive     GENTAMICIN <=1 SENSITIVE Sensitive     IMIPENEM <=0.25 SENSITIVE Sensitive     NITROFURANTOIN 32 SENSITIVE Sensitive     TRIMETH/SULFA <=20 SENSITIVE Sensitive     AMPICILLIN/SULBACTAM 4 SENSITIVE Sensitive     PIP/TAZO <=4 SENSITIVE Sensitive     Extended ESBL NEGATIVE Sensitive     * >=100,000 COLONIES/mL KLEBSIELLA PNEUMONIAE     Labs: Basic Metabolic Panel: Recent Labs  Lab 10/16/17 0014 10/16/17 0221 10/17/17 0514 10/18/17 0522 10/19/17 0452 10/20/17 0530  NA 144  --  144 145 142 142  K 6.6* 5.3* 3.5 3.8 3.8 3.9  CL 117*  --  111 112* 108 110  CO2 19*  --  22 23 21* 21*  GLUCOSE 137*  --  116* 127* 122* 120*  BUN 14  --  15 15 18  24*  CREATININE 1.10*  --  0.97 0.97 0.99 1.09*  CALCIUM 9.0  --  8.7* 9.0 9.2 8.8*  MG  --   --   --  1.7  --   --    Liver Function Tests: No results for input(s): AST, ALT, ALKPHOS, BILITOT, PROT, ALBUMIN in the last 168 hours. No results for input(s): LIPASE, AMYLASE in the last 168 hours. No results for input(s): AMMONIA in the last 168 hours. CBC: Recent Labs  Lab 10/16/17 0014  10/17/17 0514 10/18/17 0522 10/19/17 0452 10/20/17 0530  WBC 7.1 6.8 7.9 10.2 11.9*  NEUTROABS 4.7  --   --   --   --   HGB 14.4 13.8 14.5 14.5 14.3  HCT 43.5 41.6 44.8 44.5 44.0  MCV 82.9 82.2 83.1 83.0 83.3  PLT 159 163 186 217 214   Cardiac Enzymes: No results for input(s): CKTOTAL, CKMB, CKMBINDEX, TROPONINI in the last 168 hours. BNP: BNP (last 3 results) Recent Labs    08/04/17 1146 09/16/17 0142 10/16/17 0014  BNP 697.5* 318.5* 699.6*    ProBNP (last 3 results) No results for input(s): PROBNP in the last 8760 hours.  CBG: No results for input(s): GLUCAP in the last 168 hours.     Signed:  Florencia Reasons MD, PhD  [facility] 10/20/2017, 1:54 PM

## 2017-10-22 DIAGNOSIS — N183 Chronic kidney disease, stage 3 (moderate): Secondary | ICD-10-CM | POA: Diagnosis not present

## 2017-10-22 DIAGNOSIS — I5043 Acute on chronic combined systolic (congestive) and diastolic (congestive) heart failure: Secondary | ICD-10-CM | POA: Diagnosis not present

## 2017-10-22 DIAGNOSIS — I480 Paroxysmal atrial fibrillation: Secondary | ICD-10-CM | POA: Diagnosis not present

## 2017-10-22 DIAGNOSIS — M109 Gout, unspecified: Secondary | ICD-10-CM | POA: Diagnosis not present

## 2017-10-22 DIAGNOSIS — I130 Hypertensive heart and chronic kidney disease with heart failure and stage 1 through stage 4 chronic kidney disease, or unspecified chronic kidney disease: Secondary | ICD-10-CM | POA: Diagnosis not present

## 2017-10-23 ENCOUNTER — Other Ambulatory Visit: Payer: Self-pay | Admitting: *Deleted

## 2017-10-23 NOTE — Patient Outreach (Signed)
[facility]) Care Management  10/23/2017  ONEDA DUFFETT Dec 24, 1945 [id number]   New referral received as member was readmitted to hospital on 3/3 with Atrial fibrillation with RVR, discharged on 3/8.  Primary MD office will complete transition of care.    Call placed to member, identity verified.  [facility] care management services again explained as hospital liaison visited member in hospital.  She declines services at this time, stating that she needed to "get everything together" referring to medications and MD appointments as well as mobility.  Advised that home health was ordered and that [facility] is able to assist with coordination/management of health.  Also advised that [facility] pharmacist available to help with medication reconciliation.  She is receptive to pharmacist contact, but again decline assistance from this care manager.  State she only want to work with one person at a time.    Will placed referral to [facility] pharmacist, will have pharmacist notify this care manager when reconciliation is complete.  Will reach out for involvement again at that time.  [name], [place], MSN [place] [phone]

## 2017-10-23 NOTE — Telephone Encounter (Signed)
Patient contacted regarding discharge from [employer] on 10/20/17.  Patient understands to follow up with provider LAWRENCEon 10/30/17 at 9:30 AM [facility]. Patient understands discharge instructions? yes  Patient understands medications and regiment? yes  Patient understands to bring all medications to this visit? yes

## 2017-10-25 ENCOUNTER — Telehealth: Payer: Self-pay | Admitting: Pharmacist

## 2017-10-25 NOTE — Patient Outreach (Addendum)
[facility]) Care Management  10/25/2017  [name] [name] [birth year] [id number]   Patient was called regarding post discharge medication reconciliation. HIPAA identifiers were obtained. Before her medications could be reviewed, the patient said she was on her way to an appointment and asked me to call her later this afternoon.  Patient was called back. HIPAA identifiers were obtained. Patient said she could not review her medications with me because she was still out and asked that I call her at a later date.  Plan:  I will call the patient back within 3-5 business days.  [name], PharmD, [place] Clinical Pharmacist [phone]

## 2017-10-25 NOTE — Telephone Encounter (Signed)
-----   Message from Oswaldo Done sent at 10/23/2017  1:37 PM EDT ----- Regarding: Referral Order for [place],  Referral from [name], RN for Denyse Amass, [facility]  "Please see request below"  Thank you,  Carliceia "Carlie" [facility] Assistant  ----- Message ----- From: [name], RN Sent: 10/23/2017   1:31 PM To: Thn Cm Communication Orders Subject: Order for Rives T                         Patient Name: [name] [name],[name] [name]([id number]) Sex: Female DOB: 1946/06/09    PCP: HUSAIN, [place]   Types of orders made on 10/23/2017: Nursing  Order Date:10/23/2017 Ordering Baldwin, MONICA [[id number]] Encounter Provider:Lane, [place], [place] Authorizing Provider: [name], MD [[id number]] Department:[facility][[id number]]  Order Specific Information Order: Comm to Pharmacy [Custom: [id number]]  Order #: [id number] Qty: 1   Priority: Routine  Class: Clinic Performed     Priority: Routine  Class: Clinic Performed    Scheduling Instructions:   Discharged from hospital on 3/8, need medication reconciliation and    assistance.  Greater than 15 meds.  Dec lining nursing at this time, state she    only want to have 1 person involved at a time.  Nursing will resume once    medication concerns are resolved.

## 2017-10-26 ENCOUNTER — Other Ambulatory Visit: Payer: Self-pay | Admitting: *Deleted

## 2017-10-26 NOTE — Patient Outreach (Signed)
[facility]) Care Management  10/26/2017  SERENAH MILL Sep 03, 1945 [id number]   Multidisciplinary discussion complete with leadership team regarding 30 day readmission.  Team made aware that this care manager has attempted to engage member both before hospitalization as well as after discharge.  She was visited in hospital by hospital liaison and accepted services, however declined when outreach was made.  She was receptive to pharmacy contact, but they were also unable to engage.  Suggestion made to contact [name] and primary MD office to help with engagement.     Call placed to primary MD office, message left on Dr. Glenna Durand assistant's voice mail requesting a call back to discuss potential engagement with [facility] during next visit as well as call back to this care manager to discuss options.  Will await call back.  [name], [place], MSN [place] [phone]

## 2017-10-27 ENCOUNTER — Other Ambulatory Visit: Payer: Self-pay | Admitting: *Deleted

## 2017-10-27 ENCOUNTER — Other Ambulatory Visit: Payer: Self-pay | Admitting: Pharmacist

## 2017-10-27 NOTE — Patient Outreach (Signed)
[facility]) Care Management  10/27/2017  [name] [name] 1945/10/28 [id number]   Called patient regarding medication reconciliation post discharge. HIPAA identifiers were obtained. Just after describing who I was and what I was calling about, the patient declined services and said she did not want to review her medications.  Plan: Pharmacy case will be closed. I will notify her [place].  [name], PharmD, [place] Clinical Pharmacist [phone]

## 2017-10-27 NOTE — Patient Outreach (Signed)
Wyatt Haste and requested that the care manager and nurse contact [name], RN, [place] for arranging a joint visit to pt's home on their last visit. This message is being passed on for outreach coordination.  [name], RN, Care Manager notified of this communication.  Eulah Pont. Myrtie Neither, MSN, [facility] Gerontological Nurse Practitioner [facility] Care Management [phone]

## 2017-10-29 NOTE — Progress Notes (Signed)
Cardiology Office Note   Date:  10/30/2017   ID:  ARIAL GALLIGAN, [name] 09-11-1945, MRN [id number]  PCP:  [name], MD  Cardiologist:  Dr. Debara Pickett  Chief Complaint  Patient presents with  . Transitions Of Care  . Atrial Fibrillation     History of Present Illness: [name] [name] is a 72 y.o. female who presents for ongoing assessment and management of coronary artery disease, history of CABG in 2016, ischemic cardiomyopathy with reduced LVEF, per echocardiogram dated 10/19/2017, in the range of 30% to 35% with diffuse hypokinesis., persistent atrial fibrillation on Xarelto, status post left atrial appendage clipping, hypertension, with other history to include chronic kidney disease stage III, small medication compliance, who we saw recently on consultation during hospitalization for CHF exacerbation and atrial fib with RVR.  During hospitalization the patient was diuresed 5 L, and was transitioned to p.o. Lasix 60 mg twice daily and potassium supplement,  started on digoxin for better rate control, and continued on Xarelto.  She was also continued on metoprolol, and diltiazem despite reduced EF.  She is a very challenging patient as she is very confused concerning her medication regimen. She has not taken her medicine today, and on her medication list she is taking more than she has been prescribed but is uncertain what medicines they are compared to what medicines she was taking on discharge. A considerable amount of time was taken with her by CNA, Tammy. She started out on her medications and noticed the duplicity of lasix and metoprolol especially. [facility] has made several attempts to come out and see her and review her medications. She has a cousin who lives with her refills of her medication tray for daily dosing, but has been placing all medications in the medication tray is set of medications that she has been prescribed on discharge.  Past Medical History:  Diagnosis Date  . Acute  respiratory failure ([place]) 08/2017  . Arthritis   . Asthma    ??  . CAD (coronary artery disease)    a. s/p CABG 2016.  . Cardiomyopathy, ischemic    a. EF previously low, improved to LVEF 50-55% as of May 2018  . Chronic combined systolic and diastolic heart failure ([place])   . CKD (chronic kidney disease), stage III ([place])   . Gout   . Hypertension   . Hypokalemia   . LGI bleed 08/06/2017   a. felt to be hemorrhoidal during that admission (no drop in Hgb).  . Persistent atrial fibrillation (H[facility])    a. h/o difficult to control rates (complicated by noncompliance), not felt to be a candidate for ablation or antiarrhythmic due to noncompliance.  . Personal history of noncompliance with medical treatment, presenting hazards to health   . S/P CABG x 2 with clipping of LA appendage 11/14/2014   LIMA to LAD, SVG to OM, EVH via right thigh    Past Surgical History:  Procedure Laterality Date  . CARDIOVERSION N/A 11/18/2014   Procedure: CARDIOVERSION;  Surgeon: [name], MD;  Location: [place];  Service: Cardiovascular;  Laterality: N/A;  . CLIPPING OF ATRIAL APPENDAGE N/A 11/14/2014   Procedure: CLIPPING OF ATRIAL APPENDAGE;  Surgeon: [name], MD;  Location: [place];  Service: Open Heart Surgery;  Laterality: N/A;  . CORONARY ARTERY BYPASS GRAFT N/A 11/14/2014   Procedure: CORONARY ARTERY BYPASS GRAFTING (CABG)TIMES 2 USING LEFT INTERNAL MAMMARY ARTERY AND RIGHT SAPHENOUS VEIN HARVESTED ENDOSCOPICALLY;  Surgeon: [name], MD;  Location: [facility]  OR;  Service: Open Heart Surgery;  Laterality: N/A;  . LEFT HEART CATHETERIZATION WITH CORONARY ANGIOGRAM N/A 11/04/2014   Procedure: LEFT HEART CATHETERIZATION WITH CORONARY ANGIOGRAM;  Surgeon: Troy Sine, MD;  Location: [facility] CATH LAB;  Service: Cardiovascular;  Laterality: N/A;  . TEE WITHOUT CARDIOVERSION N/A 11/14/2014   Procedure: TRANSESOPHAGEAL ECHOCARDIOGRAM (TEE);  Surgeon: [name], MD;  Location: [place];  Service: Open Heart  Surgery;  Laterality: N/A;  . TEMPORARY PACEMAKER INSERTION  11/04/2014   Procedure: TEMPORARY PACEMAKER INSERTION;  Surgeon: Troy Sine, MD;  Location: [facility] CATH LAB;  Service: Cardiovascular;;     Current Outpatient Medications  Medication Sig Dispense Refill  . acetaminophen (TYLENOL) 500 MG tablet Take 500 mg by mouth every 6 (six) hours as needed for moderate pain.     Marland Kitchen aspirin EC 81 MG EC tablet Take 1 tablet (81 mg total) by mouth daily. 30 tablet 0  . atorvastatin (LIPITOR) 10 MG tablet Take 10 mg by mouth daily.    . benzonatate (TESSALON PERLES) 100 MG capsule Take 1 capsule (100 mg total) by mouth every 6 (six) hours as needed for cough. 30 capsule 0  . budesonide (PULMICORT) 0.5 MG/2ML nebulizer solution Take 2 mLs (0.5 mg total) by nebulization 2 (two) times daily.  12  . digoxin (LANOXIN) 0.125 MG tablet Take 1 tablet (0.125 mg total) by mouth daily for 15 days. Please follow up with cardiology for refills if further dose needed. 30 tablet 3  . diltiazem (CARDIZEM CD) 360 MG 24 hr capsule Take 1 capsule (360 mg total) by mouth daily. 30 capsule 3  . febuxostat ([name]) 40 MG tablet Take 40 mg by mouth daily.     . fluticasone (FLONASE) 50 MCG/ACT nasal spray Place 1 spray into both nostrils daily. (Patient taking differently: Place 1 spray into both nostrils daily as needed for allergies. ) 16 g 2  . furosemide (LASIX) 20 MG tablet Take 3 tablets (60 mg total) by mouth 2 (two) times daily. 180 tablet 2  . levalbuterol (XOPENEX) 0.63 MG/3ML nebulizer solution Take 3 mLs (0.63 mg total) by nebulization every 6 (six) hours as needed for wheezing or shortness of breath. 3 mL 12  . magnesium oxide (MAG-OX) 400 MG tablet Take 400 mg by mouth 3 (three) times daily.    . metoprolol succinate (TOPROL-XL) 100 MG 24 hr tablet Take 1 tablet (100 mg total) by mouth 2 (two) times daily. Take with or immediately following a meal. 60 tablet 3  . pantoprazole (PROTONIX) 40 MG tablet Take 1 tablet  (40 mg total) by mouth daily at 12 noon.    . potassium chloride SA (K-DUR,KLOR-CON) 20 MEQ tablet Take 1 tablet (20 mEq total) by mouth 2 (two) times daily. 60 tablet 2  . ranitidine (ZANTAC) 150 MG capsule Take 150 mg by mouth 2 (two) times daily as needed for heartburn.     . rivaroxaban (XARELTO) 20 MG TABS tablet Take 1 tablet (20 mg total) by mouth daily with supper. 30 tablet 4  . tiZANidine (ZANAFLEX) 2 MG tablet Take 2 mg by mouth daily as needed for muscle spasms.     No current facility-administered medications for this visit.     Allergies:   Bee venom; Codeine; Shrimp [shellfish allergy]; and Tomato    Social History:  The patient  reports that she has quit smoking. Her smoking use included cigarettes. She has a 5.60 pack-year smoking history. she has never used smokeless tobacco.  She reports that she does not drink alcohol or use drugs.   Family History:  [name] family history includes Cancer in her mother; Heart disease in her father.    ROS: All other systems are reviewed and negative. Unless otherwise mentioned in H&P    PHYSICAL EXAM: VS:  BP 138/82   Pulse 76   Ht 5\' 7"  (1.702 m)   Wt 193 lb 6.4 oz (87.7 kg)   BMI 30.29 kg/m  , BMI Body mass index is 30.29 kg/m. GEN: Well nourished, well developed, in no acute distress  HEENT: normal  Neck: no JVD, carotid bruits, or masses Cardiac:RRR;distant heart sounds, 1/6 systolic murmur, no  rubs, or gallops,no edema  Respiratory:  clear to auscultation bilaterally, normal work of breathing GI: soft, nontender, nondistended, + BS MS: no deformity or atrophy  Skin: warm and dry, no rash Neuro:  Strength and sensation are intact Psych: euthymic mood, full affect   EKG:  Atrial fibrillation heart rate of 76 bpm, inferior lateral T-wave abnormality with inversion noted. Inversion more prominent than prior EKG in January 2019  Recent Labs: 07/26/2017: ALT 53 09/05/2017: TSH 2.187 10/16/2017: B Natriuretic Peptide  699.6 10/18/2017: Magnesium 1.7 10/20/2017: BUN 24; Creatinine, Ser 1.09; Hemoglobin 14.3; Platelets 214; Potassium 3.9; Sodium 142    Lipid Panel    Component Value Date/Time   CHOL 116 11/12/2014 0432   TRIG 111 11/12/2014 0432   HDL 34 (L) 11/12/2014 0432   CHOLHDL 3.4 11/12/2014 0432   VLDL 22 11/12/2014 0432   LDLCALC 60 11/12/2014 0432      Wt Readings from Last 3 Encounters:  10/30/17 193 lb 6.4 oz (87.7 kg)  10/20/17 192 lb 7.4 oz (87.3 kg)  09/24/17 198 lb 9.6 oz (90.1 kg)      Other studies Reviewed: Echocardiogram 3 /02/2018 Left ventricle: The cavity size was normal. Wall thickness was   increased in a pattern of moderate LVH. There was mild focal   basal hypertrophy of the septum. Systolic function was moderately   to severely reduced. The estimated ejection fraction was in the   range of 30% to 35%. Diffuse hypokinesis. - Aortic valve: There was mild regurgitation. - Mitral valve: Calcified annulus. There was moderate   regurgitation. - Left atrium: The atrium was severely dilated. - Right ventricle: The cavity size was moderately dilated. Systolic   function was moderately reduced. - Right atrium: The atrium was severely dilated. - Pulmonary arteries: Systolic pressure was mildly increased. PA   peak pressure: 50 mm Hg (S).  Impressions:  - Moderate to severe global reduction in LV systolic function;   moderate LVH; mild AI; moderate MR; severe biatrial enlargement;   moderate RVE with moderate RV dysfunction; mild TR; mild   pulmonary hypertension.  ASSESSMENT AND PLAN:  1. Ischemic cardiomyopathy: She is on multiple medications to include dua;l doses of metoprolol which she should not be taking, higher doses of Lasix than prescribed, I reviewed all of her medications and compared them to discharge summary medications when she was stable and did not have any decompensation of heart failure. I marked the bottles which she should stop taking. We are  putting out a new medication list. The patient is educated again on medical compliance. I am not certain she has a full grasp of her health status. She is to continue with TSH visits.   I'm going to refer her to the advanced heart failure clinic for assistance and medication adjustments. For now I'm  just trying to make sure she is taking which she was prescribed on discharge. May need to consider Entresto and stop diltiazem.  2. Atrial fib: She remains on Xarelto but has not been taking for a 3-4 days. Thought she ran out of it. I have shown her the pill and placed it in her medication tray for morning dose. She is rate controlled on metoprolol but should be on 100 mg BID not 50 mg TID. She has been kept on diltiazem despite low EF. She is now on digoxin 0.125 daily.    3. HFrEF: Remains on diuretic, but is confused as to which dose, so she is taking both 60 mg TID and 20 mg BID. This will need to be adjusted. She is to be on 60 mg BID per discharge summary. She may be a candidate for [employer]. Will see how she does on her current regimen before making changes as she is very confused about them.   4. CAD: Hx of CABG. No chest pain.  5. Hypertension: Currently elevated for reduced EF. She has not taken her medications today yet   Current medicines are reviewed at length with the patient today.  A minimum of one hour was spent with this patient from the beginning of her office visit with CNA sorting out her medications, along with me going over each one of them, comparing them to discharge summary, and removing those which she is not taking anymore and putting them in a separate bag. I have added Xarelto to her medication tray.  Labs/ tests ordered today include: None  Phill Myron. West Pugh, ANP, AACC   10/30/2017 12:13 PM    [facility] [address] [place], [place] [address] Phone: [phone]; Fax: [phone]

## 2017-10-30 ENCOUNTER — Ambulatory Visit: Payer: PPO | Admitting: Adult Health

## 2017-10-30 ENCOUNTER — Encounter: Payer: Self-pay | Admitting: Adult Health

## 2017-10-30 VITALS — BP 138/82 | HR 76 | Ht 67.0 in | Wt 193.4 lb

## 2017-10-30 DIAGNOSIS — M109 Gout, unspecified: Secondary | ICD-10-CM | POA: Diagnosis not present

## 2017-10-30 DIAGNOSIS — I4891 Unspecified atrial fibrillation: Secondary | ICD-10-CM

## 2017-10-30 DIAGNOSIS — I255 Ischemic cardiomyopathy: Secondary | ICD-10-CM

## 2017-10-30 DIAGNOSIS — Z79899 Other long term (current) drug therapy: Secondary | ICD-10-CM | POA: Diagnosis not present

## 2017-10-30 DIAGNOSIS — I509 Heart failure, unspecified: Secondary | ICD-10-CM | POA: Diagnosis not present

## 2017-10-30 DIAGNOSIS — Z9114 Patient's other noncompliance with medication regimen: Secondary | ICD-10-CM | POA: Diagnosis not present

## 2017-10-30 DIAGNOSIS — N183 Chronic kidney disease, stage 3 (moderate): Secondary | ICD-10-CM | POA: Diagnosis not present

## 2017-10-30 DIAGNOSIS — I480 Paroxysmal atrial fibrillation: Secondary | ICD-10-CM | POA: Diagnosis not present

## 2017-10-30 DIAGNOSIS — I130 Hypertensive heart and chronic kidney disease with heart failure and stage 1 through stage 4 chronic kidney disease, or unspecified chronic kidney disease: Secondary | ICD-10-CM | POA: Diagnosis not present

## 2017-10-30 DIAGNOSIS — I10 Essential (primary) hypertension: Secondary | ICD-10-CM

## 2017-10-30 DIAGNOSIS — I5043 Acute on chronic combined systolic (congestive) and diastolic (congestive) heart failure: Secondary | ICD-10-CM | POA: Diagnosis not present

## 2017-10-30 MED ORDER — RIVAROXABAN 20 MG PO TABS: 20.0000 mg | ORAL_TABLET | Freq: Every day | ORAL | 4 refills | Status: DC

## 2017-10-30 MED ORDER — FUROSEMIDE 20 MG PO TABS: 60.0000 mg | ORAL_TABLET | Freq: Two times a day (BID) | ORAL | 2 refills | Status: DC

## 2017-10-30 MED ORDER — DIGOXIN 125 MCG PO TABS: 0.1250 mg | ORAL_TABLET | Freq: Every day | ORAL | 3 refills | Status: DC

## 2017-10-30 MED ORDER — DILTIAZEM HCL ER COATED BEADS 360 MG PO CP24: 360.0000 mg | ORAL_CAPSULE | Freq: Every day | ORAL | 3 refills | Status: DC

## 2017-10-30 MED ORDER — POTASSIUM CHLORIDE CRYS ER 20 MEQ PO TBCR: 20.0000 meq | EXTENDED_RELEASE_TABLET | Freq: Two times a day (BID) | ORAL | 2 refills | Status: DC

## 2017-10-30 MED ORDER — METOPROLOL SUCCINATE ER 100 MG PO TB24: 100.0000 mg | ORAL_TABLET | Freq: Two times a day (BID) | ORAL | 3 refills | Status: DC

## 2017-10-30 NOTE — Patient Instructions (Addendum)
Medication Instructions:  STOP METOPROLOL 50MG   TAKE METOPROLOL SUCCINATE 100MG  TWICE DAILY  TAKE LASIX 60MG  TWICE DAILY  If you need a refill on your cardiac medications before your next appointment, please call your pharmacy.  Labwork: BMET AND DIGOXIN TODAY HERE IN OUR OFFICE AT [facility]  Special Instructions:  REFER TO [facility]  Follow-Up: Your physician wants you to follow-up in: [place] DNP.    Thank you for choosing [facility] at [facility]!!     Low-Sodium Eating Plan Sodium, which is an element that makes up salt, helps you maintain a healthy balance of fluids in your body. Too much sodium can increase your blood pressure and cause fluid and waste to be held in your body. Your health care provider or dietitian may recommend following this plan if you have high blood pressure (hypertension), kidney disease, liver disease, or heart failure. Eating less sodium can help lower your blood pressure, reduce swelling, and protect your heart, liver, and kidneys. What are tips for following this plan? General guidelines  Most people on this plan should limit their sodium intake to 1,500-2,000 mg (milligrams) of sodium each day. Reading food labels  The Nutrition Facts label lists the amount of sodium in one serving of the food. If you eat more than one serving, you must multiply the listed amount of sodium by the number of servings.  Choose foods with less than 140 mg of sodium per serving.  Avoid foods with 300 mg of sodium or more per serving. Shopping  Look for lower-sodium products, often labeled as "low-sodium" or "no salt added."  Always check the sodium content even if foods are labeled as "unsalted" or "no salt added".  Buy fresh foods. ? Avoid canned foods and premade or frozen meals. ? Avoid canned, cured, or processed meats  Buy breads that have less than 80 mg of sodium per  slice. Cooking  Eat more home-cooked food and less restaurant, buffet, and fast food.  Avoid adding salt when cooking. Use salt-free seasonings or herbs instead of table salt or sea salt. Check with your health care provider or pharmacist before using salt substitutes.  Cook with plant-based oils, such as canola, sunflower, or olive oil. Meal planning  When eating at a restaurant, ask that your food be prepared with less salt or no salt, if possible.  Avoid foods that contain MSG (monosodium glutamate). MSG is sometimes added to [place] food, bouillon, and some canned foods. What foods are recommended? The items listed may not be a complete list. Talk with your dietitian about what dietary choices are best for you. Grains Low-sodium cereals, including oats, puffed wheat and rice, and shredded wheat. Low-sodium crackers. Unsalted rice. Unsalted pasta. Low-sodium bread. Whole-grain breads and whole-grain pasta. Vegetables Fresh or frozen vegetables. "No salt added" canned vegetables. "No salt added" tomato sauce and paste. Low-sodium or reduced-sodium tomato and vegetable juice. Fruits Fresh, frozen, or canned fruit. Fruit juice. Meats and other protein foods Fresh or frozen (no salt added) meat, poultry, seafood, and fish. Low-sodium canned tuna and salmon. Unsalted nuts. Dried peas, beans, and lentils without added salt. Unsalted canned beans. Eggs. Unsalted nut butters. Dairy Milk. Soy milk. Cheese that is naturally low in sodium, such as ricotta cheese, fresh mozzarella, or Swiss cheese Low-sodium or reduced-sodium cheese. Cream cheese. Yogurt. Fats and oils Unsalted butter. Unsalted margarine with no trans fat. Vegetable oils such as canola or olive oils. Seasonings and  other foods Fresh and dried herbs and spices. Salt-free seasonings. Low-sodium mustard and ketchup. Sodium-free salad dressing. Sodium-free light mayonnaise. Fresh or refrigerated horseradish. Lemon juice. Vinegar.  Homemade, reduced-sodium, or low-sodium soups. Unsalted popcorn and pretzels. Low-salt or salt-free chips. What foods are not recommended? The items listed may not be a complete list. Talk with your dietitian about what dietary choices are best for you. Grains Instant hot cereals. Bread stuffing, pancake, and biscuit mixes. Croutons. Seasoned rice or pasta mixes. Noodle soup cups. Boxed or frozen macaroni and cheese. Regular salted crackers. Self-rising flour. Vegetables Sauerkraut, pickled vegetables, and relishes. Olives. [place] fries. Onion rings. Regular canned vegetables (not low-sodium or reduced-sodium). Regular canned tomato sauce and paste (not low-sodium or reduced-sodium). Regular tomato and vegetable juice (not low-sodium or reduced-sodium). Frozen vegetables in sauces. Meats and other protein foods Meat or fish that is salted, canned, smoked, spiced, or pickled. Bacon, ham, sausage, hotdogs, corned beef, chipped beef, packaged lunch meats, salt pork, jerky, pickled herring, anchovies, regular canned tuna, sardines, salted nuts. Dairy Processed cheese and cheese spreads. Cheese curds. Blue cheese. Feta cheese. String cheese. Regular cottage cheese. Buttermilk. Canned milk. Fats and oils Salted butter. Regular margarine. Ghee. Bacon fat. Seasonings and other foods Onion salt, garlic salt, seasoned salt, table salt, and sea salt. Canned and packaged gravies. Worcestershire sauce. Tartar sauce. Barbecue sauce. Teriyaki sauce. Soy sauce, including reduced-sodium. Steak sauce. Fish sauce. Oyster sauce. Cocktail sauce. Horseradish that you find on the shelf. Regular ketchup and mustard. Meat flavorings and tenderizers. Bouillon cubes. Hot sauce and Tabasco sauce. Premade or packaged marinades. Premade or packaged taco seasonings. Relishes. Regular salad dressings. Salsa. Potato and tortilla chips. Corn chips and puffs. Salted popcorn and pretzels. Canned or dried soups. Pizza. Frozen entrees and  pot pies. Summary  Eating less sodium can help lower your blood pressure, reduce swelling, and protect your heart, liver, and kidneys.  Most people on this plan should limit their sodium intake to 1,500-2,000 mg (milligrams) of sodium each day.  Canned, boxed, and frozen foods are high in sodium. Restaurant foods, fast foods, and pizza are also very high in sodium. You also get sodium by adding salt to food.  Try to cook at home, eat more fresh fruits and vegetables, and eat less fast food, canned, processed, or prepared foods. This information is not intended to replace advice given to you by your health care provider. Make sure you discuss any questions you have with your health care provider. Document Released: 01/21/2002 Document Revised: 07/25/2016 Document Reviewed: 07/25/2016 Elsevier Interactive Patient Education  [employer].

## 2017-10-31 DIAGNOSIS — I509 Heart failure, unspecified: Secondary | ICD-10-CM | POA: Diagnosis not present

## 2017-10-31 DIAGNOSIS — I4891 Unspecified atrial fibrillation: Secondary | ICD-10-CM | POA: Diagnosis not present

## 2017-10-31 DIAGNOSIS — M109 Gout, unspecified: Secondary | ICD-10-CM | POA: Diagnosis not present

## 2017-10-31 DIAGNOSIS — J449 Chronic obstructive pulmonary disease, unspecified: Secondary | ICD-10-CM | POA: Diagnosis not present

## 2017-10-31 DIAGNOSIS — I5020 Unspecified systolic (congestive) heart failure: Secondary | ICD-10-CM | POA: Diagnosis not present

## 2017-10-31 LAB — BASIC METABOLIC PANEL
BUN/Creatinine Ratio: 17 (ref 12–28)
BUN: 19 mg/dL (ref 8–27)
CO2: 21 mmol/L (ref 20–29)
Calcium: 9.7 mg/dL (ref 8.7–10.3)
Chloride: 103 mmol/L (ref 96–106)
Creatinine, Ser: 1.09 mg/dL — ABNORMAL HIGH (ref 0.57–1.00)
GFR calc Af Amer: 59 mL/min/{1.73_m2} — ABNORMAL LOW (ref >59)
GFR calc non Af Amer: 51 mL/min/{1.73_m2} — ABNORMAL LOW (ref >59)
Glucose: 106 mg/dL — ABNORMAL HIGH (ref 65–99)
Potassium: 3.5 mmol/L (ref 3.5–5.2)
Sodium: 143 mmol/L (ref 134–144)

## 2017-10-31 LAB — DIGOXIN LEVEL: Digoxin, Serum: 0.5 ng/mL (ref 0.5–0.9)

## 2017-11-01 ENCOUNTER — Other Ambulatory Visit: Payer: Self-pay

## 2017-11-01 ENCOUNTER — Other Ambulatory Visit: Payer: Self-pay | Admitting: *Deleted

## 2017-11-01 MED ORDER — DIGOXIN 125 MCG PO TABS: 0.1250 mg | ORAL_TABLET | Freq: Every day | ORAL | 3 refills | Status: DC

## 2017-11-01 NOTE — Patient Outreach (Signed)
[place] [facility]) Care Management  11/01/2017  [name] [name] 1946-04-27 [id number]   Voice message received from [name] at primary MD office requesting call back to discuss case.  Call placed back to [facility], unable to reach.  Voice message left, will await call back.  [name], [place], MSN [place] [phone]

## 2017-11-03 ENCOUNTER — Other Ambulatory Visit: Payer: Self-pay | Admitting: *Deleted

## 2017-11-03 NOTE — Patient Outreach (Signed)
[place] [facility]) Care Management  11/03/2017  [name] [name] 1946-02-25 [id number]   Call placed to [name] at Dr. Glenna Durand office regarding potential [facility] involvement.  Voice message left, will await call back.     Update @ 1500:    Voice message received back from [name] stating that member is now agreeable to [facility] services.  Will place call to member in attempt to schedule home visit next week.  Will provide update to [name] pending outcome of outreach to member.  [name], [place], MSN [place] [phone]

## 2017-11-06 ENCOUNTER — Other Ambulatory Visit: Payer: Self-pay | Admitting: *Deleted

## 2017-11-06 ENCOUNTER — Other Ambulatory Visit (HOSPITAL_BASED_OUTPATIENT_CLINIC_OR_DEPARTMENT_OTHER): Payer: Self-pay

## 2017-11-06 DIAGNOSIS — R5383 Other fatigue: Secondary | ICD-10-CM

## 2017-11-06 DIAGNOSIS — G4730 Sleep apnea, unspecified: Secondary | ICD-10-CM

## 2017-11-06 DIAGNOSIS — G4710 Hypersomnia, unspecified: Secondary | ICD-10-CM

## 2017-11-06 NOTE — Patient Outreach (Signed)
[place] [facility]) Care Management  11/06/2017  [name] [name] February 04, 1946 [id number]   Call placed to member in attempt to engage in [facility] services at this care manager was notified that she was now agreeable, no answer.  HIPAA compliant voice message left.  Will await call back, if no call back will make 2nd attempt tomorrow.  [name], [place], MSN [place] [phone]

## 2017-11-07 ENCOUNTER — Other Ambulatory Visit: Payer: Self-pay | Admitting: *Deleted

## 2017-11-07 DIAGNOSIS — I5043 Acute on chronic combined systolic (congestive) and diastolic (congestive) heart failure: Secondary | ICD-10-CM | POA: Diagnosis not present

## 2017-11-07 DIAGNOSIS — I130 Hypertensive heart and chronic kidney disease with heart failure and stage 1 through stage 4 chronic kidney disease, or unspecified chronic kidney disease: Secondary | ICD-10-CM | POA: Diagnosis not present

## 2017-11-07 DIAGNOSIS — M109 Gout, unspecified: Secondary | ICD-10-CM | POA: Diagnosis not present

## 2017-11-07 DIAGNOSIS — N183 Chronic kidney disease, stage 3 (moderate): Secondary | ICD-10-CM | POA: Diagnosis not present

## 2017-11-07 DIAGNOSIS — I480 Paroxysmal atrial fibrillation: Secondary | ICD-10-CM | POA: Diagnosis not present

## 2017-11-07 NOTE — Patient Outreach (Signed)
[place] [facility]) Care Management  11/07/2017  [name] [name] August 13, 1946 [id number]   Second attempt made to contact member for engagement.  She report she does not have time to talk as she has the home health nurse there at this time.  She is advised to contact this care manger when she is able to speak.  Will await call back, if no call back will follow up within the next 2 business days.  [name], [place], MSN [place] [phone]

## 2017-11-13 ENCOUNTER — Other Ambulatory Visit: Payer: Self-pay | Admitting: *Deleted

## 2017-11-13 NOTE — Patient Outreach (Signed)
[place] [facility]) Care Management  11/13/2017  [name] [name] [birth year] [id number]   Call placed to member, another attempt to engage in services.  [facility] care management services explained.  She report she is still involved with home health, difference between [facility] and home health explained.  She begins to discuss MD appointments and confusion regarding regular cardiologist and A-fib clinic.  She request this care manager to clarify and call back.  Call placed to cardiology office to inquire about appointments scheduled (Dr. Debara Pickett 4/8, K. Lawrence 4/22, and Heart failure clinic 4/26).  Clarification noted that HF clinic will manage A-fib, other appointments are for follow up with assigned cardiologist.  Call placed back to member to provide update and to proceed with telephone assessment and engagement, no answer.  HIPAA compliant voice message left, will await call back.  If no call back, will follow up tomorrow.  [name], [place], MSN [place] [phone]

## 2017-11-14 ENCOUNTER — Other Ambulatory Visit: Payer: Self-pay | Admitting: *Deleted

## 2017-11-14 NOTE — Patient Outreach (Signed)
[place] [facility]) Care Management  11/14/2017  [name] [name] April 24, 1946 [id number]   Call placed to member as a follow up from conversation yesterday.  She has changed her mind about [facility] involvement at this time.  State she would like to complete sessions with [facility] before considering [facility].  [name] is already aware to contact [facility] prior to their discharge.  This care manager will close case at this time, will wait for call from home health to re-open.  [name], [place], MSN [place] [phone]

## 2017-11-16 DIAGNOSIS — I130 Hypertensive heart and chronic kidney disease with heart failure and stage 1 through stage 4 chronic kidney disease, or unspecified chronic kidney disease: Secondary | ICD-10-CM | POA: Diagnosis not present

## 2017-11-16 DIAGNOSIS — M109 Gout, unspecified: Secondary | ICD-10-CM | POA: Diagnosis not present

## 2017-11-16 DIAGNOSIS — I5043 Acute on chronic combined systolic (congestive) and diastolic (congestive) heart failure: Secondary | ICD-10-CM | POA: Diagnosis not present

## 2017-11-16 DIAGNOSIS — I480 Paroxysmal atrial fibrillation: Secondary | ICD-10-CM | POA: Diagnosis not present

## 2017-11-16 DIAGNOSIS — N183 Chronic kidney disease, stage 3 (moderate): Secondary | ICD-10-CM | POA: Diagnosis not present

## 2017-11-19 ENCOUNTER — Encounter (HOSPITAL_BASED_OUTPATIENT_CLINIC_OR_DEPARTMENT_OTHER): Payer: PPO

## 2017-11-20 ENCOUNTER — Encounter: Payer: Self-pay | Admitting: Internal Medicine

## 2017-11-20 ENCOUNTER — Ambulatory Visit: Payer: PPO | Admitting: Internal Medicine

## 2017-11-20 VITALS — BP 130/90 | HR 99 | Ht 67.5 in | Wt 198.6 lb

## 2017-11-20 DIAGNOSIS — I509 Heart failure, unspecified: Secondary | ICD-10-CM

## 2017-11-20 DIAGNOSIS — I4891 Unspecified atrial fibrillation: Secondary | ICD-10-CM | POA: Diagnosis not present

## 2017-11-20 DIAGNOSIS — Z951 Presence of aortocoronary bypass graft: Secondary | ICD-10-CM | POA: Diagnosis not present

## 2017-11-20 MED ORDER — METOPROLOL SUCCINATE ER 100 MG PO TB24: 100.0000 mg | ORAL_TABLET | Freq: Two times a day (BID) | ORAL | 5 refills | Status: DC

## 2017-11-20 NOTE — Patient Instructions (Signed)
Dr. Debara Pickett has recommended that your appointment with Jory Sims DNP be cancelled  Please keep your appointment in the heart failure clinic  Your physician wants you to follow-up in: 6 months with Dr. Debara Pickett. You will receive a reminder letter in the mail two months in advance. If you don't receive a letter, please call our office to schedule the follow-up appointment.

## 2017-11-20 NOTE — Progress Notes (Signed)
OFFICE NOTE  Chief Complaint:  Follow-up hospitalization  Primary Care Physician: [name], MD  HPI:  [name] [name] is a pleasant 72 year old female who I recently saw the hospital when she was in the cardiac care unit. She has a long standing history of Hypertension and tobacco abuse. She was admitted to [facility] 10/29/2014 with a several week history of worsening symptoms of shortness of breath, chest tightness, fatigue, and URI symptoms. She was initially evaluated by her PCP at which time she was noted to be in Atrial Fibrillation with RVR. She was sent to the emergency department for evaluation. EKG confirmed Atrial Fibrillation, Troponin was negative. CXR was obtained and was concerning for pneumonia and she was started on empiric ABX. She also underwent TTE which showed a reduced EF of 35-40%. She had diffuse global hypokinesis especially of the inferior wall. It was felt the patient should undergo cardiac catheterization for further diagnostics, however patient became anxious and would not consent to the procedure. The patient calmed down and in a few days and further consultation she was agreeable to proceed with cardiac catheterization. This was to be done on 11/03/2014 however, the prior to the procedure the patient became agitated, combativeness and shortness of breath. She progressed to full cardiac arrest with asystole without any preceding signs of symptoms. She was successful resuscitated but showed signs of decerebrate posturing and was placed on the cooling protocol. During cooling she continued to have Atrial Fibrillation which progressed to bradycardia. It was again decided she should undergo cardiac catheterization. This was completed on 11/04/2014 and revealed significant coronary artery disease. It was felt coronary bypass grafting would be the treatment of choice and TCTS was consulted. She was evaluated by Dr. Roxy Manns who agreed at some point she  would benefit from Coronary Bypass grafting procedure. However at the time of evaluation the patient remained on a ventilator and was sedated without signs of neurologic recovery. It was felt medical therapy should be continued at this time and if patient awakes and returns to baseline surgery could be reconsidered at that point.The patient was treated medically for several days. She was successfully weaned and later extubated. She continued to have issues with Atrial Fibrillation. She was again evaluated by Dr. Roxy Manns on 11/11/2014 at which time he felt she was medically stable to proceed with Coronary Bypass grafting procedure. The risks and benefits of the procedure were explained to the patient and she was agreeable to proceed. She was taken to the operating room on 11/14/2014. She underwent CABG x 2 utilizing LIMA to LAD, SVG to OM. She also underwent clipping of her Left Atrial Appendage and endoscopic saphenous vein harvest from her right thigh. She tolerated the procedure well and was taken to the SICU in stable condition. The patient was extubated the evening of surgery. During her stay in the SICU she developed Rapid Atrial Fibrillation. She was treated with Amiodarone and Lopressor, but ultimately did not convert to NSR. EP consult was requested and it was felt cardioversion could be attempted. This was completed on POD # 4 and was unsuccessful. However, the patient ultimately converted to NSR on her own. She remains on Lopressor, which has been titrated as tolerated. She was also placed on Eliquis for stroke prevention.  She returns today for follow-up and is feeling quite well. She denies any chest pain or worsening shortness of breath. She is interesting in getting off of amiodarone completely. She seems to be maintaining sinus rhythm  at a rate of 55 with occasional PVCs.  [name] [name] returns today for follow-up. She denies any chest pain but is reporting some tenderness and heaviness  in the left breast. She feels that it somewhat enlarged and feels heavier than the right side. It was before thought that her pain in the left chest may be related to her sternotomy however her symptoms are worsening. She denies any nipple discharge or skin changes in that area. She had been struggling with a Candida intertrigo which was successfully treated with topical medications. In addition she has some chronic back pain which I think is unrelated. Recently she saw a nurse practitioner and was placed on carvedilol as an alternative to her metoprolol. She seems to be tolerating this fairly well. Heart rate is now higher than it had been previously.  02/08/2016  [name] [name] returns today for follow-up. At her last office visit she was complaining of some breast heaviness and pain and I ordered a mammogram which was negative for any malignancy or masses. She appears to have had resolution of her Candida intertrigo and I've indicated that she could use baby powder or a dry antiperspirant to help with sweating and itching. She continues to have some complaints of numbness and tingling the go down both arms which could be arising from the shoulders or neck. I will refer to orthopedics for this. In addition she reports some shortness of breath and weight gain which she thinks is related to medicines however it seems to be more related to appetite and decreased exercise. She is also back off on her smoking some but has not yet been able to quit.  05/19/2016  [name] [name] is seen today in follow-up. She has a number of unusual complaints today. Most recently however she was placed on a number of medications for blood pressure from her primary care provider which were different for the medications we have listed for her. She noted some side effects from these and says that she stopped taking them and resume taking the medications that she is currently on. Of note she has lost 7 pounds since her last office visit. She  denies any worsening shortness of breath or chest pain. She is due for repeat echo for repeat assessment of her cardiomyopathy.  12/26/2016  [name] [name] was seen today for follow-up. She recently notify the office she wanted to switch cardiologist because she was looking for a female cardiologist that could help her with her hot flashes. She says she breaks out in a sweat for no reason. She's also been having problems with abdominal discomfort. Her ejection fraction actually has improved significantly recently by echo from 35-40% up to 50-55% in October 2017. Despite this, she reports she's been short of breath recently. Blood pressure remains elevated on concerned about the medication compliance.  02/08/2017  [name] [name] returns for follow-up she had a repeat echo which was scheduled that showed stable LVEF of 50-55%. Unfortunately, she was briefly admitted for a-fib with RVR. She was started on diltiazem and converted overnight. Seen by [name] who spoke with me to confirm since she has had surgical closure of the LAA she does not require anticoagulation. She is on aspirin for CAD. She was not discharged on additional medication. Since she responded to CCB, I will start a low dose to help prevent recurrence. She is still concerned about dysphagia, particularly to solid foods and bloating. She takes ranitidine.  06/22/2017  I will was seen today in follow-up.  Recently she was noted to have A. fib with RVR.  She saw Almyra Deforest, PA-C, who further increased her diltiazem to 240 mg daily.  She says she is not tolerated this, that she broke out in a rash and had candidal type intertrigo under her breasts.  She feels like this is all related to the increased dose of the diltiazem and therefore discontinued the medicine.  She reports taking her carvedilol twice a day and this would explain why her A. fib rate is uncontrolled today at 138 bpm.  Despite this she is asymptomatic.  11/20/2017  [name] [name] returns today  for follow-up.  She was recently hospitalized for congestive heart failure, successfully diuresed and struggled with delirium and agitation.  She then had a fever toward the end of hospitalization was diagnosed with influenza pneumonia.  Her hospitalization was further extended until she recovered.  Today she reports no recollection of that time in the hospital nor does she remember me visiting her.  Today she denies any chest pain or worsening shortness of breath.  She is in A. fib with RVR.  Her medications have been adjusted.  She says her heart rate is slower at home.  She is currently out of her metoprolol, and her sister says that she has been off that medication for about a month.  She was seen by our nurse practitioner Leonia Reader, DNP, who felt that she should be referred to the advanced heart failure clinic.  An appointment is scheduled later this month.  PMHx:  Past Medical History:  Diagnosis Date  . Acute respiratory failure ([place]) 08/2017  . Arthritis   . Asthma    ??  . CAD (coronary artery disease)    a. s/p CABG 2016.  . Cardiomyopathy, ischemic    a. EF previously low, improved to LVEF 50-55% as of May 2018  . Chronic combined systolic and diastolic heart failure ([place])   . CKD (chronic kidney disease), stage III ([place])   . Gout   . Hypertension   . Hypokalemia   . LGI bleed 08/06/2017   a. felt to be hemorrhoidal during that admission (no drop in Hgb).  . Persistent atrial fibrillation ([facility])    a. h/o difficult to control rates (complicated by noncompliance), not felt to be a candidate for ablation or antiarrhythmic due to noncompliance.  . Personal history of noncompliance with medical treatment, presenting hazards to health   . S/P CABG x 2 with clipping of LA appendage 11/14/2014   LIMA to LAD, SVG to OM, EVH via right thigh    Past Surgical History:  Procedure Laterality Date  . CARDIOVERSION N/A 11/18/2014   Procedure: CARDIOVERSION;  Surgeon: [name], MD;   Location: [place];  Service: Cardiovascular;  Laterality: N/A;  . CLIPPING OF ATRIAL APPENDAGE N/A 11/14/2014   Procedure: CLIPPING OF ATRIAL APPENDAGE;  Surgeon: [name], MD;  Location: [place];  Service: Open Heart Surgery;  Laterality: N/A;  . CORONARY ARTERY BYPASS GRAFT N/A 11/14/2014   Procedure: CORONARY ARTERY BYPASS GRAFTING (CABG)TIMES 2 USING LEFT INTERNAL MAMMARY ARTERY AND RIGHT SAPHENOUS VEIN HARVESTED ENDOSCOPICALLY;  Surgeon: [name], MD;  Location: [place];  Service: Open Heart Surgery;  Laterality: N/A;  . LEFT HEART CATHETERIZATION WITH CORONARY ANGIOGRAM N/A 11/04/2014   Procedure: LEFT HEART CATHETERIZATION WITH CORONARY ANGIOGRAM;  Surgeon: Troy Sine, MD;  Location: [facility] CATH LAB;  Service: Cardiovascular;  Laterality: N/A;  . TEE WITHOUT CARDIOVERSION N/A 11/14/2014  Procedure: TRANSESOPHAGEAL ECHOCARDIOGRAM (TEE);  Surgeon: [name], MD;  Location: [place];  Service: Open Heart Surgery;  Laterality: N/A;  . TEMPORARY PACEMAKER INSERTION  11/04/2014   Procedure: TEMPORARY PACEMAKER INSERTION;  Surgeon: Troy Sine, MD;  Location: [facility] CATH LAB;  Service: Cardiovascular;;    FAMHx:  Family History  Problem Relation Age of Onset  . Cancer Mother   . Heart disease Father     SOCHx:   reports that she has quit smoking. Her smoking use included cigarettes. She has a 5.60 pack-year smoking history. She has never used smokeless tobacco. She reports that she does not drink alcohol or use drugs.  ALLERGIES:  Allergies  Allergen Reactions  . Bee Venom Anaphylaxis  . Codeine Nausea And Vomiting  . Shrimp [Shellfish Allergy] Swelling  . Tomato     Pt states it gives her gout    ROS: Pertinent items noted in HPI and remainder of comprehensive ROS otherwise negative.  HOME MEDS: Current Outpatient Medications  Medication Sig Dispense Refill  . acetaminophen (TYLENOL) 500 MG tablet Take 500 mg by mouth every 6 (six) hours as needed for moderate pain.     Marland Kitchen  aspirin EC 81 MG EC tablet Take 1 tablet (81 mg total) by mouth daily. 30 tablet 0  . budesonide (PULMICORT) 0.5 MG/2ML nebulizer solution Take 2 mLs (0.5 mg total) by nebulization 2 (two) times daily.  12  . diltiazem (CARDIZEM CD) 360 MG 24 hr capsule Take 1 capsule (360 mg total) by mouth daily. 30 capsule 3  . fluticasone (FLONASE) 50 MCG/ACT nasal spray Place 1 spray into both nostrils daily. (Patient taking differently: Place 1 spray into both nostrils daily as needed for allergies. ) 16 g 2  . furosemide (LASIX) 20 MG tablet Take 3 tablets (60 mg total) by mouth 2 (two) times daily. 180 tablet 2  . levalbuterol (XOPENEX) 0.63 MG/3ML nebulizer solution Take 3 mLs (0.63 mg total) by nebulization every 6 (six) hours as needed for wheezing or shortness of breath. 3 mL 12  . magnesium oxide (MAG-OX) 400 MG tablet Take 400 mg by mouth 3 (three) times daily.    . metoprolol succinate (TOPROL-XL) 100 MG 24 hr tablet Take 1 tablet (100 mg total) by mouth 2 (two) times daily. Take with or immediately following a meal. 60 tablet 5  . potassium chloride SA (K-DUR,KLOR-CON) 20 MEQ tablet Take 1 tablet (20 mEq total) by mouth 2 (two) times daily. 60 tablet 2  . ranitidine (ZANTAC) 150 MG capsule Take 150 mg by mouth 2 (two) times daily as needed for heartburn.     . rivaroxaban (XARELTO) 20 MG TABS tablet Take 1 tablet (20 mg total) by mouth daily with supper. 30 tablet 4  . atorvastatin (LIPITOR) 10 MG tablet Take 10 mg by mouth daily.    . digoxin (LANOXIN) 0.125 MG tablet Take 1 tablet (0.125 mg total) by mouth daily. (Patient not taking: Reported on 11/20/2017) 30 tablet 3   No current facility-administered medications for this visit.     LABS/IMAGING: No results found for this or any previous visit (from the past 48 hour(s)). No results found.  WEIGHTS: Wt Readings from Last 3 Encounters:  11/20/17 198 lb 9.6 oz (90.1 kg)  10/30/17 193 lb 6.4 oz (87.7 kg)  10/20/17 192 lb 7.4 oz (87.3 kg)     VITALS: BP 130/90   Pulse 99   Ht 5' 7.5" (1.715 m)   Wt 198 lb 9.6  oz (90.1 kg)   SpO2 97% Comment: on room air  BMI 30.65 kg/m   EXAM: General appearance: alert, no distress and mildly obese Neck: no carotid bruit and no JVD Lungs: clear to auscultation bilaterally Heart: irregularly irregular rhythm and Tachycardic Abdomen: soft, non-tender; bowel sounds normal; no masses,  no organomegaly Extremities: extremities normal, atraumatic, no cyanosis or edema Pulses: 2+ and symmetric Skin: Skin color, texture, turgor normal. No rashes or lesions Neurologic: Grossly normal Psych: Unusual affect  EKG: A. fib with rapid ventricular response of 110, lateral ST and T wave changes-personally reviewed  ASSESSMENT: 1. PAF-remains in RVR, noncompliance with diltiazem 2. Coronary artery disease status post CABG 2 with LIMA to LAD and SVG to OM 3. Acute on chronic combined systolic congestive heart failure, EF 35-40% (improved to 50-55% - 5/18) 4. Paroxysmal atrial fibrillation-CHADSVASC 4, - status post left atrial closure (not on anticoagulation) 5. Dyslipidemia 6. Hypertension - uncontrolled 7. Dyspnea on exertion 8. Dysphasia to solid foods, bloating  PLAN: 1.   [name] [name] continues to be in A. fib with RVR.  She has been out of her metoprolol coronary her sister and we will refill that today.  Weight is up about 5 pounds over the past couple weeks and needs to be monitored closely.  She has been questionably compliant with her medications although she says she is very compliant.  She is having memory issues and may have been delirious during her hospitalization.  She does not recall me rounding on her.  She is referred to the advanced heart failure clinic.  She certainly may be a good candidate to add Entresto as per Dr. Elna Breslow notes.  We will defer to [name] since an appointment is already scheduled.  Follow-up with me in 3-6 months.  [name], MD, [facility], [place] Director of the Advanced Lipid Disorders &  Cardiovascular Risk Reduction Clinic Diplomate of the American Board of Clinical Lipidology Attending Cardiologist  Direct Dial: [phone]  Fax: [phone]  Website:  [email or web address] Filkins 11/20/2017, 1:39 PM

## 2017-11-22 ENCOUNTER — Encounter (HOSPITAL_BASED_OUTPATIENT_CLINIC_OR_DEPARTMENT_OTHER): Payer: PPO

## 2017-11-22 ENCOUNTER — Other Ambulatory Visit: Payer: Self-pay | Admitting: Adult Health

## 2017-11-30 ENCOUNTER — Other Ambulatory Visit: Payer: Self-pay | Admitting: Internal Medicine

## 2017-11-30 MED ORDER — DILTIAZEM HCL ER COATED BEADS 360 MG PO CP24: 360.0000 mg | ORAL_CAPSULE | Freq: Every day | ORAL | 3 refills | Status: DC

## 2017-11-30 MED ORDER — RIVAROXABAN 20 MG PO TABS: 20.0000 mg | ORAL_TABLET | Freq: Every day | ORAL | 1 refills | Status: DC

## 2017-11-30 MED ORDER — MAGNESIUM OXIDE 400 MG PO TABS: 400.0000 mg | ORAL_TABLET | Freq: Three times a day (TID) | ORAL | 3 refills | Status: DC

## 2017-11-30 NOTE — Telephone Encounter (Signed)
° ° ° °*  STAT* If patient is at the pharmacy, call can be transferred to refill team.   1. Which medications need to be refilled? (please list name of each medication and dose if known) rivaroxaban (XARELTO) 20 MG TABS tablet , magnesium oxide (MAG-OX) 400 MG tablet, diltiazem (CARDIZEM CD) 360 MG 24 hr capsule  2. Which pharmacy/location (including street and city if local pharmacy) is medication to be sent to?[id number] - [place], [place] - [address]. Do they need a 30 day or 90 day supply? [place]

## 2017-12-04 ENCOUNTER — Ambulatory Visit: Payer: PPO | Admitting: Adult Health

## 2017-12-07 ENCOUNTER — Telehealth: Payer: Self-pay | Admitting: Internal Medicine

## 2017-12-08 ENCOUNTER — Encounter (HOSPITAL_COMMUNITY): Payer: Self-pay | Admitting: *Deleted

## 2017-12-08 ENCOUNTER — Encounter (HOSPITAL_COMMUNITY): Payer: Self-pay | Admitting: Internal Medicine

## 2017-12-08 ENCOUNTER — Ambulatory Visit (HOSPITAL_COMMUNITY)
Admission: RE | Admit: 2017-12-08 | Discharge: 2017-12-08 | Disposition: A | Discharge status: Home / Self Care | Payer: PPO | Source: Ambulatory Visit | Attending: Internal Medicine | Admitting: Internal Medicine

## 2017-12-08 ENCOUNTER — Telehealth (HOSPITAL_COMMUNITY): Payer: Self-pay | Admitting: Pharmacist

## 2017-12-08 ENCOUNTER — Other Ambulatory Visit: Payer: Self-pay

## 2017-12-08 ENCOUNTER — Other Ambulatory Visit (HOSPITAL_COMMUNITY): Payer: Self-pay | Admitting: *Deleted

## 2017-12-08 VITALS — BP 140/90 | HR 76 | Wt 199.2 lb

## 2017-12-08 DIAGNOSIS — I5042 Chronic combined systolic (congestive) and diastolic (congestive) heart failure: Secondary | ICD-10-CM | POA: Insufficient documentation

## 2017-12-08 DIAGNOSIS — Z7982 Long term (current) use of aspirin: Secondary | ICD-10-CM | POA: Diagnosis not present

## 2017-12-08 DIAGNOSIS — I10 Essential (primary) hypertension: Secondary | ICD-10-CM

## 2017-12-08 DIAGNOSIS — I130 Hypertensive heart and chronic kidney disease with heart failure and stage 1 through stage 4 chronic kidney disease, or unspecified chronic kidney disease: Secondary | ICD-10-CM | POA: Diagnosis not present

## 2017-12-08 DIAGNOSIS — Z87891 Personal history of nicotine dependence: Secondary | ICD-10-CM | POA: Diagnosis not present

## 2017-12-08 DIAGNOSIS — Z7901 Long term (current) use of anticoagulants: Secondary | ICD-10-CM | POA: Insufficient documentation

## 2017-12-08 DIAGNOSIS — N183 Chronic kidney disease, stage 3 (moderate): Secondary | ICD-10-CM | POA: Insufficient documentation

## 2017-12-08 DIAGNOSIS — I4891 Unspecified atrial fibrillation: Secondary | ICD-10-CM

## 2017-12-08 DIAGNOSIS — I4819 Other persistent atrial fibrillation: Secondary | ICD-10-CM

## 2017-12-08 DIAGNOSIS — Z951 Presence of aortocoronary bypass graft: Secondary | ICD-10-CM | POA: Diagnosis not present

## 2017-12-08 DIAGNOSIS — I480 Paroxysmal atrial fibrillation: Secondary | ICD-10-CM | POA: Diagnosis not present

## 2017-12-08 DIAGNOSIS — E785 Hyperlipidemia, unspecified: Secondary | ICD-10-CM | POA: Insufficient documentation

## 2017-12-08 DIAGNOSIS — I481 Persistent atrial fibrillation: Secondary | ICD-10-CM

## 2017-12-08 DIAGNOSIS — I2510 Atherosclerotic heart disease of native coronary artery without angina pectoris: Secondary | ICD-10-CM | POA: Diagnosis not present

## 2017-12-08 DIAGNOSIS — Z79899 Other long term (current) drug therapy: Secondary | ICD-10-CM | POA: Diagnosis not present

## 2017-12-08 MED ORDER — AMIODARONE HCL 200 MG PO TABS: 400.0000 mg | ORAL_TABLET | Freq: Two times a day (BID) | ORAL | 3 refills | Status: DC

## 2017-12-08 MED ORDER — RIVAROXABAN 20 MG PO TABS: 20.0000 mg | ORAL_TABLET | Freq: Every day | ORAL | 1 refills | Status: DC

## 2017-12-08 MED ORDER — SACUBITRIL-VALSARTAN 24-26 MG PO TABS: 1.0000 | ORAL_TABLET | Freq: Two times a day (BID) | ORAL | 3 refills | Status: DC

## 2017-12-08 MED ORDER — MAGNESIUM OXIDE 400 MG PO TABS: 400.0000 mg | ORAL_TABLET | Freq: Three times a day (TID) | ORAL | 3 refills | Status: DC

## 2017-12-08 NOTE — Patient Instructions (Signed)
Stop Diltiazem  Start Amiodarone 400 mg (2 tabs) Twice daily FOR 1 WEEK ONLY, then take 200 mg (1 tab) Twice daily   Start Entresto 24/26 mg Twice daily   Your physician has recommended that you have a Cardioversion (DCCV). Electrical Cardioversion uses a jolt of electricity to your heart either through paddles or wired patches attached to your chest. This is a controlled, usually prescheduled, procedure. Defibrillation is done under light anesthesia in the hospital, and you usually go home the day of the procedure. This is done to get your heart back into a normal rhythm. You are not awake for the procedure. Please see the instruction sheet given to you today.  Your physician recommends that you schedule a follow-up appointment in: 3 weeks

## 2017-12-08 NOTE — H&P (View-Only) (Signed)
[facility] Consult Note   PCP: [name], MD PCP-Cardiologist: [name], MD   HPI:  [name] [name] is a 72 y.o. female with h/o HTN, Tobacco abuse, PAF, CAD s/p CABG x 2 (LIMA to LAD and SVG to OM) in 2016, Chronic combined CHF, Ischemic CMP, HLD, and DOE.   She is being referred to AHF clinic by Jory Sims, NP for management of HF.   She was initially noted to be in atrial fibrillation in 2016 with a reduced LVEF, leading to ischemic workup with a cardiac cath showing severe multivessel CAD with LM involvement in which she underwent CABG x2 utilizing LIMA to LAD, SVG to OM.   Echo in 3/16 EF 35-40%. Echo in 10/17 EF improved to 50-55%.  F/u echo 5/18 EF stable 50-55%  Admitted 1/19 for AF with RVR in setting of Influenza A. During that hospitalization, she was started on diltiazem drip, EP was consulted to discuss plans for Tikosyn vs. ablation vs. medication rate control however, due to her known medication noncompliance she was found to not be an ablation nor antiarrhythmic candidate  Pt admitted 3/3 - 10/20/17 with recurrent Afib RVR and ADHF. Started on diltiazem. Echo repeated which showed fall in EF from previous. Diuresed with IV lasix with 5L negative for admission. Discharged home on diltiazem 360, Torpol 100, digoxin and Xarelto.  Seen by Dr. Debara Pickett 11/20/17, was feeling well overall. Of note, she reported not remembering her recent hospitalization. She was also noted to be out of her metoprolol for approximately a month.   She is here with her sister. She is a poor historian ,She says she is feeling overall OK since visit with Dr. Debara Pickett.  She is SOB walking short distances at baseline.  She lives at home and puts her pills in a pill box.  She took her last Xarelto last night, and has refills to pick up today. She has peripheral edema at times, but usually well managed.  She has felt more SOB since June since she has been in Afib. She cannot tell  when she is in Afib. She has occasional palpitations with activity. She denies exertional chest pain. Occasional orthopnea.  She has stopped smoking since her March hospitalizations.  She has had very mild nosebleeds this week. Thinks it may be allergies contributing.    Echo 10/19/17 LVEF 30-35%, Moderate LVH, Mild AI, Moderate regurgitation, Severe LAE, Moderately reduced RV, Severe RAE, PA peak pressure 50 mm Hg.   (Decreased from Echo 01/11/2017 with LVEF 50-55%)  Review of Systems: [y] = yes, [ ]  = no   General: Weight gain [ ] ; Weight loss [ ] ; Anorexia [ ] ; Fatigue [y]; Fever [ ] ; Chills [ ] ; Weakness [ ]   Cardiac: Chest pain/pressure [ ] ; Resting SOB [ ] ; Exertional SOB [y]; Orthopnea [y]; Pedal Edema [y]; Palpitations [ ] ; Syncope [ ] ; Presyncope [ ] ; Paroxysmal nocturnal dyspnea[ ]   Pulmonary: Cough [ ] ; Wheezing[ ] ; Hemoptysis[ ] ; Sputum [ ] ; Snoring [ ]   GI: Vomiting[ ] ; Dysphagia[ ] ; Melena[ ] ; Hematochezia [ ] ; Heartburn[ ] ; Abdominal pain [ ] ; Constipation [ ] ; Diarrhea [ ] ; BRBPR [ ]   GU: Hematuria[ ] ; Dysuria [ ] ; Nocturia[ ]   Vascular: Pain in legs with walking [ ] ; Pain in feet with lying flat [ ] ; Non-healing sores [ ] ; Stroke [ ] ; TIA [ ] ; Slurred speech [ ] ;  Neuro: Headaches[ ] ; Vertigo[ ] ; Seizures[ ] ; Paresthesias[ ] ;Blurred vision [ ] ; Diplopia [ ] ;  Vision changes [ ]   Ortho/Skin: Arthritis [y]; Joint pain [y]; Muscle pain [ ] ; Joint swelling [ ] ; Back Pain [ ] ; Rash [ ]   Psych: Depression[ ] ; Anxiety[ ]   Heme: Bleeding problems [ ] ; Clotting disorders [ ] ; Anemia [ ]   Endocrine: Diabetes [ ] ; Thyroid dysfunction[ ]   Past Medical History:  Diagnosis Date  . Acute respiratory failure ([place]) 08/2017  . Arthritis   . Asthma    ??  . CAD (coronary artery disease)    a. s/p CABG 2016.  . Cardiomyopathy, ischemic    a. EF previously low, improved to LVEF 50-55% as of May 2018  . Chronic combined systolic and diastolic heart failure ([place])   . CKD (chronic kidney disease),  stage III ([place])   . Gout   . Hypertension   . Hypokalemia   . LGI bleed 08/06/2017   a. felt to be hemorrhoidal during that admission (no drop in Hgb).  . Persistent atrial fibrillation ([facility])    a. h/o difficult to control rates (complicated by noncompliance), not felt to be a candidate for ablation or antiarrhythmic due to noncompliance.  . Personal history of noncompliance with medical treatment, presenting hazards to health   . S/P CABG x 2 with clipping of LA appendage 11/14/2014   LIMA to LAD, SVG to OM, EVH via right thigh   Current Outpatient Medications  Medication Sig Dispense Refill  . acetaminophen (TYLENOL) 500 MG tablet Take 500 mg by mouth every 6 (six) hours as needed for moderate pain.     Marland Kitchen aspirin EC 81 MG EC tablet Take 1 tablet (81 mg total) by mouth daily. 30 tablet 0  . atorvastatin (LIPITOR) 10 MG tablet Take 10 mg by mouth daily.    . budesonide (PULMICORT) 0.5 MG/2ML nebulizer solution Take 2 mLs (0.5 mg total) by nebulization 2 (two) times daily.  12  . digoxin (LANOXIN) 0.125 MG tablet Take 1 tablet (0.125 mg total) by mouth daily. (Patient not taking: Reported on 11/20/2017) 30 tablet 3  . diltiazem (CARDIZEM CD) 360 MG 24 hr capsule Take 1 capsule (360 mg total) by mouth daily. 90 capsule 3  . fluticasone (FLONASE) 50 MCG/ACT nasal spray Place 1 spray into both nostrils daily. (Patient taking differently: Place 1 spray into both nostrils daily as needed for allergies. ) 16 g 2  . furosemide (LASIX) 20 MG tablet TAKE 3 TABLETS (60 MG TOTAL) BY MOUTH 2 (TWO) TIMES DAILY. 180 tablet 11  . KLOR-CON M20 20 MEQ tablet TAKE 1 TABLET BY MOUTH TWICE A DAY 60 tablet 11  . levalbuterol (XOPENEX) 0.63 MG/3ML nebulizer solution Take 3 mLs (0.63 mg total) by nebulization every 6 (six) hours as needed for wheezing or shortness of breath. 3 mL 12  . magnesium oxide (MAG-OX) 400 MG tablet Take 1 tablet (400 mg total) by mouth 3 (three) times daily. 270 tablet 3  . metoprolol  succinate (TOPROL-XL) 100 MG 24 hr tablet Take 1 tablet (100 mg total) by mouth 2 (two) times daily. Take with or immediately following a meal. 60 tablet 5  . ranitidine (ZANTAC) 150 MG capsule Take 150 mg by mouth 2 (two) times daily as needed for heartburn.     . rivaroxaban (XARELTO) 20 MG TABS tablet Take 1 tablet (20 mg total) by mouth daily with supper. 90 tablet 1   No current facility-administered medications for this encounter.    Allergies  Allergen Reactions  . Bee Venom Anaphylaxis  . Codeine Nausea  And Vomiting  . Shrimp [Shellfish Allergy] Swelling  . Tomato     Pt states it gives her gout   Social History   Socioeconomic History  . Marital status: Widowed    Spouse name: Not on file  . Number of children: Not on file  . Years of education: Not on file  . Highest education level: Not on file  Occupational History  . Not on file  Social Needs  . Financial resource strain: Not on file  . Food insecurity:    Worry: Not on file    Inability: Not on file  . Transportation needs:    Medical: Not on file    Non-medical: Not on file  Tobacco Use  . Smoking status: Former Smoker    Packs/day: 0.10    Years: 56.00    Pack years: 5.60    Types: Cigarettes  . Smokeless tobacco: Never Used  Substance and Sexual Activity  . Alcohol use: No    Alcohol/week: 0.0 oz  . Drug use: No  . Sexual activity: Not on file  Lifestyle  . Physical activity:    Days per week: Not on file    Minutes per session: Not on file  . Stress: Not on file  Relationships  . Social connections:    Talks on phone: Not on file    Gets together: Not on file    Attends religious service: Not on file    Active member of club or organization: Not on file    Attends meetings of clubs or organizations: Not on file    Relationship status: Not on file  . Intimate partner violence:    Fear of current or ex partner: Not on file    Emotionally abused: Not on file    Physically abused: Not on file      Forced sexual activity: Not on file  Other Topics Concern  . Not on file  Social History Narrative  . Not on file   Family History  Problem Relation Age of Onset  . Cancer Mother   . Heart disease Father    Vitals:   12/08/17 1138  BP: 140/90  Pulse: 76  SpO2: 98%  Weight: 199 lb 4 oz (90.4 kg)   Wt Readings from Last 3 Encounters:  12/08/17 199 lb 4 oz (90.4 kg)  11/20/17 198 lb 9.6 oz (90.1 kg)  10/30/17 193 lb 6.4 oz (87.7 kg)    PHYSICAL EXAM: General:  Well appearing. No respiratory difficulty HEENT: normal anicteric  Neck: supple. JVP 7-8 cm. Carotids 2+ bilat; no bruits. No lymphadenopathy or thyromegaly appreciated. Cor: PMI nondisplaced. Irregularly irregular. Tachy No M/G/R.  Lungs: CTAB, normal effort.  Abdomen: obese soft, nontender, nondistended. No hepatosplenomegaly. No bruits or masses. Good bowel sounds. Extremities: no cyanosis, clubbing, or rash. Trace to 1+ ankle edema.  Neuro: alert & oriented x 3, cranial nerves grossly intact. moves all 4 extremities w/o difficulty. Affect pleasant  ECG: Afib RVR at 104 bpm, Personally reviewed.   ASSESSMENT & PLAN:  1. Chronic combined CHF - Suspect etiology is combined Ischemic/NICM with Afib/RVR for nearly a year.   - Echo 10/19/17 LVEF 30-35%, Moderate LVH, Mild AI, Moderate regurgitation, Severe LAE, Moderately reduced RV, Severe RAE, PA peak pressure 50 mm Hg.   (Decreased from Echo 01/11/2017 with LVEF 50-55%) - NYHA III symptoms - Volume status stable to mildly elevated.  - Continue lasix 60 mg BID.  - Continue Toprol XL 100 mg BID -  Contnue digoxin 0.125 mg daily. Check level today. - Stop diltiazem with low EF.   - Start Entresto 24/26 mg BID.  - Consider spiro next visit.  - Reinforced fluid restriction to < 2 L daily, sodium restriction to less than 2000 mg daily, and the importance of daily weights.    2. CAD s/p CABG x 2 2016 (LIMA to LAD and SVG to OM) - No s/s of ischemia. - Continue ASA and  statin.  3. PAF - EKG today shows Afib RVR at 104 bpm - This patients CHA2DS2-VASc is at least 5.  - She is on Xarelto.  - Will load with amiodarone start at 400 mg BID, and plan for DCCV.   4. HTN - Meds as above.   5. Tobacco Abuse - Stopped smoking 10/2017. Says "I never want to smoke again."  6. HLD - Per [facility]  7. Suspected OSA/Snoring - Needs sleep study.   8. Social  - She has poor insight into her disease. She may be a candidate for paramedicine if we have trouble with compliance.   Shirley Friar, PA-C 12/08/17   Patient seen and examined with the above-signed Advanced Practice Provider and/or Housestaff. I personally reviewed laboratory data, imaging studies and relevant notes. I independently examined the patient and formulated the important aspects of the plan. I have edited the note to reflect any of my changes or salient points. I have personally discussed the plan with the patient and/or family.  Recent echo images, ECG and discharge summaries reviewed personally.   72 y/o woman as above with CAD, COPD, HTN. Recently developed recurrent AF with fall in EF. From 50-55% -> 30-35%. Has been noncompliant with meds. She has very poor insight or understanding into her medical conditions. Volume status looks ok on exam but remains in AF with mild RVR. LA is 6cm on echo.   Suspect she likely has tachy-CM. Agree with starting amiodarone followed by DC-CV as needed. Continue Xarelto. Will stop diltiazem with low EF. Start Entresto. Will need sleep study.   She will almost certainly need Paramedicine in near future to assist with compliance.   Total time spent 55 minutes. Over half that time spent discussing above.   [name], MD  9:51 PM

## 2017-12-08 NOTE — Progress Notes (Signed)
[facility] Clinic Consult Note   PCP: [name], MD PCP-Cardiologist: [name], MD   HPI:  [name] [name] is a 72 y.o. female with h/o HTN, Tobacco abuse, PAF, CAD s/p CABG x 2 (LIMA to LAD and SVG to OM) in 2016, Chronic combined CHF, Ischemic CMP, HLD, and DOE.   She is being referred to AHF clinic by Jory Sims, NP for management of HF.   She was initially noted to be in atrial fibrillation in 2016 with a reduced LVEF, leading to ischemic workup with a cardiac cath showing severe multivessel CAD with LM involvement in which she underwent CABG x2 utilizing LIMA to LAD, SVG to OM.   Echo in 3/16 EF 35-40%. Echo in 10/17 EF improved to 50-55%.  F/u echo 5/18 EF stable 50-55%  Admitted 1/19 for AF with RVR in setting of Influenza A. During that hospitalization, she was started on diltiazem drip, EP was consulted to discuss plans for Tikosyn vs. ablation vs. medication rate control however, due to her known medication noncompliance she was found to not be an ablation nor antiarrhythmic candidate  Pt admitted 3/3 - 10/20/17 with recurrent Afib RVR and ADHF. Started on diltiazem. Echo repeated which showed fall in EF from previous. Diuresed with IV lasix with 5L negative for admission. Discharged home on diltiazem 360, Torpol 100, digoxin and Xarelto.  Seen by Dr. Debara Pickett 11/20/17, was feeling well overall. Of note, she reported not remembering her recent hospitalization. She was also noted to be out of her metoprolol for approximately a month.   She is here with her sister. She is a poor historian ,She says she is feeling overall OK since visit with Dr. Debara Pickett.  She is SOB walking short distances at baseline.  She lives at home and puts her pills in a pill box.  She took her last Xarelto last night, and has refills to pick up today. She has peripheral edema at times, but usually well managed.  She has felt more SOB since June since she has been in Afib. She cannot tell  when she is in Afib. She has occasional palpitations with activity. She denies exertional chest pain. Occasional orthopnea.  She has stopped smoking since her March hospitalizations.  She has had very mild nosebleeds this week. Thinks it may be allergies contributing.    Echo 10/19/17 LVEF 30-35%, Moderate LVH, Mild AI, Moderate regurgitation, Severe LAE, Moderately reduced RV, Severe RAE, PA peak pressure 50 mm Hg.   (Decreased from Echo 01/11/2017 with LVEF 50-55%)  Review of Systems: [y] = yes, [ ]  = no   General: Weight gain [ ] ; Weight loss [ ] ; Anorexia [ ] ; Fatigue [y]; Fever [ ] ; Chills [ ] ; Weakness [ ]   Cardiac: Chest pain/pressure [ ] ; Resting SOB [ ] ; Exertional SOB [y]; Orthopnea [y]; Pedal Edema [y]; Palpitations [ ] ; Syncope [ ] ; Presyncope [ ] ; Paroxysmal nocturnal dyspnea[ ]   Pulmonary: Cough [ ] ; Wheezing[ ] ; Hemoptysis[ ] ; Sputum [ ] ; Snoring [ ]   GI: Vomiting[ ] ; Dysphagia[ ] ; Melena[ ] ; Hematochezia [ ] ; Heartburn[ ] ; Abdominal pain [ ] ; Constipation [ ] ; Diarrhea [ ] ; BRBPR [ ]   GU: Hematuria[ ] ; Dysuria [ ] ; Nocturia[ ]   Vascular: Pain in legs with walking [ ] ; Pain in feet with lying flat [ ] ; Non-healing sores [ ] ; Stroke [ ] ; TIA [ ] ; Slurred speech [ ] ;  Neuro: Headaches[ ] ; Vertigo[ ] ; Seizures[ ] ; Paresthesias[ ] ;Blurred vision [ ] ; Diplopia [ ] ;  Vision changes [ ]   Ortho/Skin: Arthritis [y]; Joint pain [y]; Muscle pain [ ] ; Joint swelling [ ] ; Back Pain [ ] ; Rash [ ]   Psych: Depression[ ] ; Anxiety[ ]   Heme: Bleeding problems [ ] ; Clotting disorders [ ] ; Anemia [ ]   Endocrine: Diabetes [ ] ; Thyroid dysfunction[ ]   Past Medical History:  Diagnosis Date  . Acute respiratory failure ([place]) 08/2017  . Arthritis   . Asthma    ??  . CAD (coronary artery disease)    a. s/p CABG 2016.  . Cardiomyopathy, ischemic    a. EF previously low, improved to LVEF 50-55% as of May 2018  . Chronic combined systolic and diastolic heart failure ([place])   . CKD (chronic kidney disease),  stage III ([place])   . Gout   . Hypertension   . Hypokalemia   . LGI bleed 08/06/2017   a. felt to be hemorrhoidal during that admission (no drop in Hgb).  . Persistent atrial fibrillation ([facility])    a. h/o difficult to control rates (complicated by noncompliance), not felt to be a candidate for ablation or antiarrhythmic due to noncompliance.  . Personal history of noncompliance with medical treatment, presenting hazards to health   . S/P CABG x 2 with clipping of LA appendage 11/14/2014   LIMA to LAD, SVG to OM, EVH via right thigh   Current Outpatient Medications  Medication Sig Dispense Refill  . acetaminophen (TYLENOL) 500 MG tablet Take 500 mg by mouth every 6 (six) hours as needed for moderate pain.     Marland Kitchen aspirin EC 81 MG EC tablet Take 1 tablet (81 mg total) by mouth daily. 30 tablet 0  . atorvastatin (LIPITOR) 10 MG tablet Take 10 mg by mouth daily.    . budesonide (PULMICORT) 0.5 MG/2ML nebulizer solution Take 2 mLs (0.5 mg total) by nebulization 2 (two) times daily.  12  . digoxin (LANOXIN) 0.125 MG tablet Take 1 tablet (0.125 mg total) by mouth daily. (Patient not taking: Reported on 11/20/2017) 30 tablet 3  . diltiazem (CARDIZEM CD) 360 MG 24 hr capsule Take 1 capsule (360 mg total) by mouth daily. 90 capsule 3  . fluticasone (FLONASE) 50 MCG/ACT nasal spray Place 1 spray into both nostrils daily. (Patient taking differently: Place 1 spray into both nostrils daily as needed for allergies. ) 16 g 2  . furosemide (LASIX) 20 MG tablet TAKE 3 TABLETS (60 MG TOTAL) BY MOUTH 2 (TWO) TIMES DAILY. 180 tablet 11  . KLOR-CON M20 20 MEQ tablet TAKE 1 TABLET BY MOUTH TWICE A DAY 60 tablet 11  . levalbuterol (XOPENEX) 0.63 MG/3ML nebulizer solution Take 3 mLs (0.63 mg total) by nebulization every 6 (six) hours as needed for wheezing or shortness of breath. 3 mL 12  . magnesium oxide (MAG-OX) 400 MG tablet Take 1 tablet (400 mg total) by mouth 3 (three) times daily. 270 tablet 3  . metoprolol  succinate (TOPROL-XL) 100 MG 24 hr tablet Take 1 tablet (100 mg total) by mouth 2 (two) times daily. Take with or immediately following a meal. 60 tablet 5  . ranitidine (ZANTAC) 150 MG capsule Take 150 mg by mouth 2 (two) times daily as needed for heartburn.     . rivaroxaban (XARELTO) 20 MG TABS tablet Take 1 tablet (20 mg total) by mouth daily with supper. 90 tablet 1   No current facility-administered medications for this encounter.    Allergies  Allergen Reactions  . Bee Venom Anaphylaxis  . Codeine Nausea  And Vomiting  . Shrimp [Shellfish Allergy] Swelling  . Tomato     Pt states it gives her gout   Social History   Socioeconomic History  . Marital status: Widowed    Spouse name: Not on file  . Number of children: Not on file  . Years of education: Not on file  . Highest education level: Not on file  Occupational History  . Not on file  Social Needs  . Financial resource strain: Not on file  . Food insecurity:    Worry: Not on file    Inability: Not on file  . Transportation needs:    Medical: Not on file    Non-medical: Not on file  Tobacco Use  . Smoking status: Former Smoker    Packs/day: 0.10    Years: 56.00    Pack years: 5.60    Types: Cigarettes  . Smokeless tobacco: Never Used  Substance and Sexual Activity  . Alcohol use: No    Alcohol/week: 0.0 oz  . Drug use: No  . Sexual activity: Not on file  Lifestyle  . Physical activity:    Days per week: Not on file    Minutes per session: Not on file  . Stress: Not on file  Relationships  . Social connections:    Talks on phone: Not on file    Gets together: Not on file    Attends religious service: Not on file    Active member of club or organization: Not on file    Attends meetings of clubs or organizations: Not on file    Relationship status: Not on file  . Intimate partner violence:    Fear of current or ex partner: Not on file    Emotionally abused: Not on file    Physically abused: Not on file      Forced sexual activity: Not on file  Other Topics Concern  . Not on file  Social History Narrative  . Not on file   Family History  Problem Relation Age of Onset  . Cancer Mother   . Heart disease Father    Vitals:   12/08/17 1138  BP: 140/90  Pulse: 76  SpO2: 98%  Weight: 199 lb 4 oz (90.4 kg)   Wt Readings from Last 3 Encounters:  12/08/17 199 lb 4 oz (90.4 kg)  11/20/17 198 lb 9.6 oz (90.1 kg)  10/30/17 193 lb 6.4 oz (87.7 kg)    PHYSICAL EXAM: General:  Well appearing. No respiratory difficulty HEENT: normal anicteric  Neck: supple. JVP 7-8 cm. Carotids 2+ bilat; no bruits. No lymphadenopathy or thyromegaly appreciated. Cor: PMI nondisplaced. Irregularly irregular. Tachy No M/G/R.  Lungs: CTAB, normal effort.  Abdomen: obese soft, nontender, nondistended. No hepatosplenomegaly. No bruits or masses. Good bowel sounds. Extremities: no cyanosis, clubbing, or rash. Trace to 1+ ankle edema.  Neuro: alert & oriented x 3, cranial nerves grossly intact. moves all 4 extremities w/o difficulty. Affect pleasant  ECG: Afib RVR at 104 bpm, Personally reviewed.   ASSESSMENT & PLAN:  1. Chronic combined CHF - Suspect etiology is combined Ischemic/NICM with Afib/RVR for nearly a year.   - Echo 10/19/17 LVEF 30-35%, Moderate LVH, Mild AI, Moderate regurgitation, Severe LAE, Moderately reduced RV, Severe RAE, PA peak pressure 50 mm Hg.   (Decreased from Echo 01/11/2017 with LVEF 50-55%) - NYHA III symptoms - Volume status stable to mildly elevated.  - Continue lasix 60 mg BID.  - Continue Toprol XL 100 mg BID -  Contnue digoxin 0.125 mg daily. Check level today. - Stop diltiazem with low EF.   - Start Entresto 24/26 mg BID.  - Consider spiro next visit.  - Reinforced fluid restriction to < 2 L daily, sodium restriction to less than 2000 mg daily, and the importance of daily weights.    2. CAD s/p CABG x 2 2016 (LIMA to LAD and SVG to OM) - No s/s of ischemia. - Continue ASA and  statin.  3. PAF - EKG today shows Afib RVR at 104 bpm - This patients CHA2DS2-VASc is at least 5.  - She is on Xarelto.  - Will load with amiodarone start at 400 mg BID, and plan for DCCV.   4. HTN - Meds as above.   5. Tobacco Abuse - Stopped smoking 10/2017. Says "I never want to smoke again."  6. HLD - Per [facility]  7. Suspected OSA/Snoring - Needs sleep study.   8. Social  - She has poor insight into her disease. She may be a candidate for paramedicine if we have trouble with compliance.   Shirley Friar, PA-C 12/08/17   Patient seen and examined with the above-signed Advanced Practice Provider and/or Housestaff. I personally reviewed laboratory data, imaging studies and relevant notes. I independently examined the patient and formulated the important aspects of the plan. I have edited the note to reflect any of my changes or salient points. I have personally discussed the plan with the patient and/or family.  Recent echo images, ECG and discharge summaries reviewed personally.   72 y/o woman as above with CAD, COPD, HTN. Recently developed recurrent AF with fall in EF. From 50-55% -> 30-35%. Has been noncompliant with meds. She has very poor insight or understanding into her medical conditions. Volume status looks ok on exam but remains in AF with mild RVR. LA is 6cm on echo.   Suspect she likely has tachy-CM. Agree with starting amiodarone followed by DC-CV as needed. Continue Xarelto. Will stop diltiazem with low EF. Start Entresto. Will need sleep study.   She will almost certainly need Paramedicine in near future to assist with compliance.   Total time spent 55 minutes. Over half that time spent discussing above.   [name], MD  9:51 PM

## 2017-12-08 NOTE — Telephone Encounter (Signed)
Entresto 24-26 mg BID PA approved by [employer] through 08/14/18.   Ruta Hinds. Velva Harman, PharmD, BCPS, CPP Clinical Pharmacist Phone: [phone] 12/08/2017 3:18 PM

## 2017-12-12 ENCOUNTER — Encounter (HOSPITAL_BASED_OUTPATIENT_CLINIC_OR_DEPARTMENT_OTHER): Payer: PPO

## 2017-12-13 ENCOUNTER — Ambulatory Visit (HOSPITAL_BASED_OUTPATIENT_CLINIC_OR_DEPARTMENT_OTHER): Payer: PPO | Attending: Internal Medicine | Admitting: Internal Medicine

## 2017-12-13 ENCOUNTER — Encounter

## 2017-12-13 VITALS — Ht 68.5 in | Wt 225.0 lb

## 2017-12-13 DIAGNOSIS — R5383 Other fatigue: Secondary | ICD-10-CM

## 2017-12-13 DIAGNOSIS — G4730 Sleep apnea, unspecified: Secondary | ICD-10-CM

## 2017-12-13 DIAGNOSIS — G4737 Central sleep apnea in conditions classified elsewhere: Secondary | ICD-10-CM | POA: Diagnosis not present

## 2017-12-13 DIAGNOSIS — I509 Heart failure, unspecified: Secondary | ICD-10-CM | POA: Diagnosis not present

## 2017-12-13 DIAGNOSIS — I4891 Unspecified atrial fibrillation: Secondary | ICD-10-CM | POA: Diagnosis not present

## 2017-12-13 DIAGNOSIS — G4710 Hypersomnia, unspecified: Secondary | ICD-10-CM

## 2017-12-13 DIAGNOSIS — G4733 Obstructive sleep apnea (adult) (pediatric): Secondary | ICD-10-CM | POA: Diagnosis not present

## 2017-12-14 ENCOUNTER — Ambulatory Visit (HOSPITAL_COMMUNITY)
Admission: RE | Admit: 2017-12-14 | Discharge: 2017-12-14 | Disposition: A | Discharge status: Home / Self Care | Payer: PPO | Source: Ambulatory Visit | Attending: Internal Medicine | Admitting: Internal Medicine

## 2017-12-14 DIAGNOSIS — Z0130 Encounter for examination of blood pressure without abnormal findings: Secondary | ICD-10-CM | POA: Diagnosis present

## 2017-12-14 NOTE — Progress Notes (Signed)
Pt came in today for an BP check. Pt also needed help with arranging medication. Pt pill box medications had the wrong meds, duplicates and medications that were suppose to be d/c. Pt has not picked up Amiodarone that was sent on 4/26 w/OV w/[name]. Called Pharmacy who will have meds ready. I fixed pt pill box, made [place], [place] aware with no further order, and referred to Paramedic ine program.

## 2017-12-15 ENCOUNTER — Telehealth: Payer: Self-pay | Admitting: Internal Medicine

## 2017-12-15 NOTE — Telephone Encounter (Signed)
New Message:      Pt is needing some help understanding some medications she just picked up.

## 2017-12-15 NOTE — Telephone Encounter (Signed)
Patient's daughter [name] [name] was transferred to triage so that a 3 way call could be done with patient [name] [name]).   Patient was seen in HF clinic on 4/26 and she was advised on med changes (stop diltiazem, start amiodarone 400mg  BID for 1 week then 100mg  BID, start entresto 24/26mg  BID). She did NOT pick up amiodarone Rx until recently and plans to start this today. She is calling to inform us of this. She has planned DCCV on 5/8.   Message routed to HF clinic triage team in case upcoming appointments need to be adjusted since patient did not make med changes at the time they were advised.

## 2017-12-15 NOTE — Telephone Encounter (Signed)
Follow up   Pt daughter calling with more questions about medication and [place]. Please call

## 2017-12-15 NOTE — Telephone Encounter (Signed)
Returned call to patient's daughter who I spoke with earlier on 3 way call with her mom. She is in [place]. Daughter would like her mom to be referred back to [facility] who she was previously getting home health assistance from after last hospital discharge from [facility]. She feels that patient needs medication management assistance. Advised that [facility] had been in contact with patient previously per Epic notes. Also notified her that our care guide had tried to contact patient as well. Daughter is concerned as patient is not taking her medications correctly.   Informed her that I would notify Dr. Debara Pickett of these concerns and her request for [facility] referral for med assistance but he is not in the office until Tuesday

## 2017-12-17 NOTE — Procedures (Signed)
   NAME: [name] [name] DATE OF BIRTH:  [birth year] MEDICAL RECORD NUMBER [id number]  LOCATION: [facility]  PHYSICIAN: Marius Ditch  DATE OF STUDY: 12/13/2017  SLEEP STUDY TYPE: Nocturnal Polysomnogram               REFERRING PHYSICIAN: Marius Ditch, MD  INDICATION FOR STUDY: excessive daytime sleepiness, witnessed apnea, atrial fibrillation  COMORBIDITIES: CHF with EF of 30-35%  EPWORTH SLEEPINESS SCORE:   HEIGHT: 5' 8.5" (174 cm)  WEIGHT: 225 lb (102.1 kg)    Body mass index is 33.71 kg/m.  NECK SIZE: 14 in.  MEDICATIONS  Patient self administered medications include: N/A. Medications administered during study include No sleep medicine administered.Marland Kitchen   SLEEP STUDY TECHNIQUE  A multi-channel overnight Polysomnography study was performed. The channels recorded and monitored were central and occipital EEG, electrooculogram (EOG), submentalis EMG (chin), nasal and oral airflow, thoracic and abdominal wall motion, anterior tibialis EMG, snore microphone, electrocardiogram, and a pulse oximetry.   TECHNICAL COMMENTS  Comments added by Technician: PATIENT WAS ORDERED AS A NPSG Comments added by Scorer: N/A   SLEEP ARCHITECTURE  The study was initiated at 10:09:45 PM and terminated at 4:51:08 AM. The total recorded time was 401.4 minutes. EEG confirmed total sleep time was 264.5 minutes yielding a sleep efficiency of 65.9%%. Sleep onset after lights out was 14.5 minutes with a REM latency of N/A minutes. The patient spent 53.5%% of the night in stage N1 sleep, 46.5%% in stage N2 sleep, 0.0%% in stage N3 and 0.00% in REM. Wake after sleep onset (WASO) was 122.3 minutes. The Arousal Index was 51.3/hour.   RESPIRATORY PARAMETERS  There were a total of 210 respiratory disturbances out of which 125 were apneas ( 15 obstructive, 14 mixed, 96 central) and 85 hypopneas. The apnea/hypopnea index (AHI) was 47.6 events/hour. The central sleep apnea index was 21.8  events/hour. Respiratory disturbances were associated with oxygen desaturation down to a nadir of 84.0% during sleep. The mean oxygen saturation during the study was 94.0%. The cumulative time under 88% oxygen saturation was 5.5 minutes.  LEG MOVEMENT DATA  The total leg movements were 203 with a resulting leg movement index of 46.0/hr . Associated arousal with leg movement index was 0.5/hr.   CARDIAC DATA  The underlying cardiac rhythm was most consistent with atrial fibrillation. Mean heart rate during sleep was 91.4 bpm. Additional rhythm abnormalities include PVCs.   IMPRESSIONS  - Severe Obstructive Sleep apnea(OSA) - Moderate Central Sleep Apnea (CSA)  - No significant periodic leg movements(PLMs) during sleep.   DIAGNOSIS  - Obstructive Sleep Apnea (327.23 [G47.33 ICD-10]) - Central Sleep Apnea (327.27 [G47.37 ICD-10])  RECOMMENDATIONS  - CPAP titration to determine optimal pressure required to alleviate sleep disordered breathing. BiPAP titration may be required to eliminate central sleep apnea. ASV is contraindicated as the patient has an EF of 30-35%.  Marius Ditch Sleep specialist. American Board of Internal Medicine  ELECTRONICALLY SIGNED ON:  12/17/2017, 5:07 PM [place] PH: [phone]   FX: [phone] [place]

## 2017-12-18 NOTE — Telephone Encounter (Signed)
Ok to refer to [facility] home [facility].  Dr. Lemmie Evens

## 2017-12-19 NOTE — Telephone Encounter (Signed)
Spoke with Publishing copy from [facility] [place] and they do not have program that fits her needs as she was just discharged. They do have a pharmacy program that can pre-fill her medications in pill packs, a nurse does an initial home visit and will then defer to pharmacy for pill pack management. The clinical manager does not know the specifics of the program but will have the nurse Karolee Stamps who runs this program call me to provide more info. Expressed concern with pill packs since patient sees general cardiologist and now also HF and her meds may be adjusted frequently.

## 2017-12-19 NOTE — Telephone Encounter (Signed)
Called patient to confirm that she would be OK with home health nursing services. She is agreeable to this.

## 2017-12-19 NOTE — Telephone Encounter (Signed)
Called [place] at [phone]. PCP was originally providing orders for [facility] and patient was discharged in April from [facility].   This phone note encounter (and last office note) will need to be faxed to [phone]

## 2017-12-19 NOTE — Telephone Encounter (Signed)
Left detailed message for patient on name-verified VM that patient does not qualify for services thru [facility] as she was just discharged from them. Informed her via VM that our care guide should be in contact with her to arrange appointments for medication management/compliance.

## 2017-12-19 NOTE — Telephone Encounter (Signed)
Spoke with [place] with [name]. They have a dispensing pharmacy and they provide compliance packaging. They are delivered once a month. [name] sends a nurse to the client's home to do initial med reconciliation and they can send the nurse back to evaluate patient if there are problems. Since patient is seen by cardiologist Dr. Debara Pickett, is followed in HF clinic, and being treated for AF she may have frequent med changes that make her case inappropriate for this program. Karolee Stamps will mail me info on the program however for future reference.  I have also reached out to [facility] care guide Neil Crouch for assistance in contacting patient for her program.

## 2017-12-19 NOTE — Telephone Encounter (Signed)
This request has been faxed.

## 2017-12-20 ENCOUNTER — Telehealth (HOSPITAL_COMMUNITY): Payer: Self-pay | Admitting: *Deleted

## 2017-12-20 ENCOUNTER — Encounter (HOSPITAL_COMMUNITY): Admission: RE | Payer: Self-pay | Source: Ambulatory Visit

## 2017-12-20 ENCOUNTER — Encounter (HOSPITAL_COMMUNITY): Payer: Self-pay | Admitting: Anesthesiology

## 2017-12-20 ENCOUNTER — Ambulatory Visit (HOSPITAL_COMMUNITY): Admission: RE | Admit: 2017-12-20 | Payer: PPO | Source: Ambulatory Visit | Admitting: Internal Medicine

## 2017-12-20 SURGERY — CARDIOVERSION
Anesthesia: General

## 2017-12-20 NOTE — Telephone Encounter (Signed)
Endo called to report that patient didn't show up for her DCCV today.  They called and spoke with patient and she was confused that she was still scheduled for the procedure.  I have rescheduled her for Monday May 13 @ 12:00 pm.  Patient is aware that she needs to go to the Main entrance of [facility] at 10:00 AM the morning of procedure.  No further questions.

## 2017-12-21 ENCOUNTER — Telehealth (HOSPITAL_COMMUNITY): Payer: Self-pay | Admitting: Surgery

## 2017-12-21 ENCOUNTER — Telehealth: Payer: Self-pay | Admitting: Internal Medicine

## 2017-12-21 NOTE — Telephone Encounter (Signed)
Spoke with pt dtr, all the notes regarding [place] documented in her chart. Will make sure the care guide Aleyah brown know to contact the dtr not the patient. dtr agreed with this plan.

## 2017-12-21 NOTE — Telephone Encounter (Signed)
Spoke with pt daughter Kiyah Demartini, [phone], she lives in [place] and got a call from her mom who was confused based on Care Guide appt she setup earlier.  Educated daughter on role of the Care Guide and how it could help her mother with any areas of concern related to her health. Daughter is concerned that mother is home more than she used to be since she has become sick. She is worried about her moms mental health. She also mentioned that with frequent medication changes a HH nurse to come in and check her medications monthly or biweekly would be helpful as the patient keeps them all and gets confused on what to take when. Told daughter I would share concerns with Care Guide to make her aware.  She verbalized understanding, no additional questions at this time.

## 2017-12-21 NOTE — Telephone Encounter (Signed)
Called to schedule initial session for 5/21 at 2:00 pm .

## 2017-12-21 NOTE — Telephone Encounter (Signed)
New message   Patient's daughter calling, states her mother is still confused about medications and also has questions about [place]

## 2017-12-21 NOTE — Telephone Encounter (Signed)
Patient referred to the HF [facility].  I have sent all the necessary referral forms via secure email to the Paramedic team.

## 2017-12-25 ENCOUNTER — Encounter (HOSPITAL_COMMUNITY)
Admission: RE | Disposition: A | Discharge status: Home / Self Care | Payer: Self-pay | Source: Ambulatory Visit | Attending: Internal Medicine

## 2017-12-25 ENCOUNTER — Encounter (HOSPITAL_COMMUNITY): Payer: Self-pay

## 2017-12-25 ENCOUNTER — Other Ambulatory Visit: Payer: Self-pay

## 2017-12-25 ENCOUNTER — Ambulatory Visit (HOSPITAL_COMMUNITY): Payer: PPO | Admitting: Anesthesiology

## 2017-12-25 ENCOUNTER — Ambulatory Visit (HOSPITAL_COMMUNITY)
Admission: RE | Admit: 2017-12-25 | Discharge: 2017-12-25 | Disposition: A | Discharge status: Home / Self Care | Payer: PPO | Source: Ambulatory Visit | Attending: Internal Medicine | Admitting: Internal Medicine

## 2017-12-25 ENCOUNTER — Ambulatory Visit (HOSPITAL_BASED_OUTPATIENT_CLINIC_OR_DEPARTMENT_OTHER): Payer: PPO

## 2017-12-25 DIAGNOSIS — I5042 Chronic combined systolic (congestive) and diastolic (congestive) heart failure: Secondary | ICD-10-CM | POA: Diagnosis not present

## 2017-12-25 DIAGNOSIS — Z87891 Personal history of nicotine dependence: Secondary | ICD-10-CM | POA: Diagnosis not present

## 2017-12-25 DIAGNOSIS — M109 Gout, unspecified: Secondary | ICD-10-CM | POA: Diagnosis not present

## 2017-12-25 DIAGNOSIS — Z91018 Allergy to other foods: Secondary | ICD-10-CM | POA: Insufficient documentation

## 2017-12-25 DIAGNOSIS — Z7982 Long term (current) use of aspirin: Secondary | ICD-10-CM | POA: Insufficient documentation

## 2017-12-25 DIAGNOSIS — N183 Chronic kidney disease, stage 3 (moderate): Secondary | ICD-10-CM | POA: Diagnosis not present

## 2017-12-25 DIAGNOSIS — Z91013 Allergy to seafood: Secondary | ICD-10-CM | POA: Diagnosis not present

## 2017-12-25 DIAGNOSIS — I5043 Acute on chronic combined systolic (congestive) and diastolic (congestive) heart failure: Secondary | ICD-10-CM | POA: Diagnosis not present

## 2017-12-25 DIAGNOSIS — I340 Nonrheumatic mitral (valve) insufficiency: Secondary | ICD-10-CM | POA: Diagnosis not present

## 2017-12-25 DIAGNOSIS — M1990 Unspecified osteoarthritis, unspecified site: Secondary | ICD-10-CM | POA: Insufficient documentation

## 2017-12-25 DIAGNOSIS — Z8719 Personal history of other diseases of the digestive system: Secondary | ICD-10-CM | POA: Diagnosis not present

## 2017-12-25 DIAGNOSIS — E876 Hypokalemia: Secondary | ICD-10-CM | POA: Insufficient documentation

## 2017-12-25 DIAGNOSIS — Z885 Allergy status to narcotic agent status: Secondary | ICD-10-CM | POA: Insufficient documentation

## 2017-12-25 DIAGNOSIS — I4891 Unspecified atrial fibrillation: Secondary | ICD-10-CM | POA: Diagnosis not present

## 2017-12-25 DIAGNOSIS — I2510 Atherosclerotic heart disease of native coronary artery without angina pectoris: Secondary | ICD-10-CM | POA: Insufficient documentation

## 2017-12-25 DIAGNOSIS — I480 Paroxysmal atrial fibrillation: Secondary | ICD-10-CM | POA: Insufficient documentation

## 2017-12-25 DIAGNOSIS — R0683 Snoring: Secondary | ICD-10-CM | POA: Diagnosis not present

## 2017-12-25 DIAGNOSIS — Z9114 Patient's other noncompliance with medication regimen: Secondary | ICD-10-CM | POA: Diagnosis not present

## 2017-12-25 DIAGNOSIS — I700 Atherosclerosis of aorta: Secondary | ICD-10-CM | POA: Insufficient documentation

## 2017-12-25 DIAGNOSIS — I255 Ischemic cardiomyopathy: Secondary | ICD-10-CM | POA: Diagnosis not present

## 2017-12-25 DIAGNOSIS — E785 Hyperlipidemia, unspecified: Secondary | ICD-10-CM | POA: Diagnosis not present

## 2017-12-25 DIAGNOSIS — Z79899 Other long term (current) drug therapy: Secondary | ICD-10-CM | POA: Insufficient documentation

## 2017-12-25 DIAGNOSIS — Z7901 Long term (current) use of anticoagulants: Secondary | ICD-10-CM | POA: Insufficient documentation

## 2017-12-25 DIAGNOSIS — I130 Hypertensive heart and chronic kidney disease with heart failure and stage 1 through stage 4 chronic kidney disease, or unspecified chronic kidney disease: Secondary | ICD-10-CM | POA: Diagnosis not present

## 2017-12-25 DIAGNOSIS — Z91030 Bee allergy status: Secondary | ICD-10-CM | POA: Diagnosis not present

## 2017-12-25 DIAGNOSIS — Z951 Presence of aortocoronary bypass graft: Secondary | ICD-10-CM | POA: Diagnosis not present

## 2017-12-25 DIAGNOSIS — I081 Rheumatic disorders of both mitral and tricuspid valves: Secondary | ICD-10-CM | POA: Diagnosis not present

## 2017-12-25 DIAGNOSIS — Z8249 Family history of ischemic heart disease and other diseases of the circulatory system: Secondary | ICD-10-CM | POA: Insufficient documentation

## 2017-12-25 DIAGNOSIS — J449 Chronic obstructive pulmonary disease, unspecified: Secondary | ICD-10-CM | POA: Insufficient documentation

## 2017-12-25 HISTORY — PX: TEE WITHOUT CARDIOVERSION: SHX5443

## 2017-12-25 HISTORY — PX: CARDIOVERSION: SHX1299

## 2017-12-25 SURGERY — CARDIOVERSION
Anesthesia: General

## 2017-12-25 MED ORDER — EPHEDRINE SULFATE 50 MG/ML IJ SOLN
INTRAMUSCULAR | Status: DC | PRN
  Administered 2017-12-25 (×3): 5 mg via INTRAVENOUS

## 2017-12-25 MED ORDER — PROPOFOL 500 MG/50ML IV EMUL
INTRAVENOUS | Status: DC | PRN
  Administered 2017-12-25: 100 ug/kg/min via INTRAVENOUS

## 2017-12-25 MED ORDER — LIDOCAINE HCL (CARDIAC) PF 100 MG/5ML IV SOSY
PREFILLED_SYRINGE | INTRAVENOUS | Status: DC | PRN
  Administered 2017-12-25: 100 mg via INTRAVENOUS

## 2017-12-25 MED ORDER — PROPOFOL 10 MG/ML IV BOLUS
INTRAVENOUS | Status: DC | PRN
  Administered 2017-12-25: 40 mg via INTRAVENOUS

## 2017-12-25 MED ORDER — PHENYLEPHRINE HCL 10 MG/ML IJ SOLN
INTRAMUSCULAR | Status: DC | PRN
  Administered 2017-12-25: 80 ug via INTRAVENOUS

## 2017-12-25 MED ORDER — SODIUM CHLORIDE 0.9 % IV SOLN
INTRAVENOUS | Status: DC
  Administered 2017-12-25: 12:00:00 via INTRAVENOUS

## 2017-12-25 NOTE — Anesthesia Preprocedure Evaluation (Signed)
Anesthesia Evaluation  Patient identified by MRN, date of birth, ID band Patient awake    Reviewed: Allergy & Precautions, NPO status , Patient's Chart, lab work & pertinent test results, reviewed documented beta blocker date and time   Airway Mallampati: II   Neck ROM: Full    Dental no notable dental hx. (+) Teeth Intact, Dental Advisory Given   Pulmonary shortness of breath, asthma (inhalers) , pneumonia, Current Smoker, former smoker,    Pulmonary exam normal breath sounds clear to auscultation       Cardiovascular hypertension, Pt. on medications and Pt. on home beta blockers + CAD and + CABG  Normal cardiovascular exam+ dysrhythmias Atrial Fibrillation  Rhythm:Irregular Rate:Normal  SP ACB, EF prior to surgery 45%   Neuro/Psych negative neurological ROS  negative psych ROS   GI/Hepatic negative GI ROS, Neg liver ROS,   Endo/Other    Renal/GU      Musculoskeletal  (+) Arthritis ,   Abdominal (+) + obese,   Peds  Hematology negative hematology ROS (+)   Anesthesia Other Findings LEXINE JASPERS  ECHO COMPLETE WO IMAGING ENHANCING AGENT  Order# [id number]  Reading physician: Lelon Perla, MD Ordering physician: Tommie Raymond, NP Study date: 10/19/17 Study Result   Result status: Final result                             *[facility]*                         [address], [place] [address]                            [phone]  ------------------------------------------------------------------- Transthoracic Echocardiography  Patient:    [name], [name] MR #:       [id number] Study Date: 10/19/2017 Gender:     F Age:        72 Height:     170.2 cm Weight:     87.4 kg BSA:        2.06 m^2 Pt. Status: Room:       [place], RDCS  PERFORMING   [facility], Inpatient  ATTENDING    Florencia Reasons [id number]  ADMITTING    Tamala Julian, Rondell A  ORDERING     Tommie Raymond  REFERRING    Kathyrn Drown D  cc:  ------------------------------------------------------------------- LV EF: 30% -   35%  ------------------------------------------------------------------- Indications:      Atrial fibrillation - 427.31.  ------------------------------------------------------------------- History:   PMH:   Congestive heart failure.  Risk factors: Hypertension.  ------------------------------------------------------------------- Study Conclusions  - Left ventricle: The cavity size was normal. Wall thickness was   increased in a pattern of moderate LVH. There was mild focal   basal hypertrophy of the septum. Systolic function was moderately   to severely reduced. The estimated ejection fraction was in the   range of 30% to 35%. Diffuse hypokinesis. - Aortic valve: There was mild regurgitation. - Mitral valve: Calcified annulus. There was moderate   regurgitation. - Left atrium: The atrium was severely dilated. -  Right ventricle: The cavity size was moderately dilated. Systolic   function was moderately reduced. - Right atrium: The atrium was severely dilated. - Pulmonary arteries: Systolic pressure was mildly increased. PA   peak pressure: 50 mm Hg (S).     Reproductive/Obstetrics                             Anesthesia Physical  Anesthesia Plan  ASA: III  Anesthesia Plan: General   Post-op Pain Management:    Induction: Intravenous  PONV Risk Score and Plan:   Airway Management Planned: Natural Airway and Mask  Additional Equipment:   Intra-op Plan:   Post-operative Plan:   Informed Consent: I have reviewed the patients History and Physical, chart, labs and discussed the procedure including the risks, benefits and alternatives for the proposed anesthesia with the patient or authorized representative who has indicated his/her understanding and  acceptance.     Plan Discussed with: CRNA  Anesthesia Plan Comments:         Anesthesia Quick Evaluation

## 2017-12-25 NOTE — CV Procedure (Signed)
   TRANSESOPHAGEAL ECHOCARDIOGRAM GUIDED DIRECT CURRENT CARDIOVERSION  NAME:  [name] [name]   MRN: [id number] DOB:  [birth year]   ADMIT DATE: 12/25/2017  INDICATIONS: Atrial flutter   PROCEDURE:   The patient was scheduled for DC-CV only but missed several doses or Xarelto last week so TEE required.   Informed consent was obtained prior to the procedure. The risks, benefits and alternatives for the procedure were discussed and the patient comprehended these risks.  Risks include, but are not limited to, cough, sore throat, vomiting, nausea, somnolence, esophageal and stomach trauma or perforation, bleeding, low blood pressure, aspiration, pneumonia, infection, trauma to the teeth and death.    After a procedural time-out, the oropharynx was anesthetized and the patient was sedated by the anesthesia service. The transesophageal probe was inserted in the esophagus and stomach without difficulty and multiple views were obtained.   FINDINGS:  LEFT VENTRICLE: EF = 20-25% diffuse HK   RIGHT VENTRICLE: Moderate to severely HK  LEFT ATRIUM: Severely dilate LA diameter 5.3cm  LEFT ATRIAL APPENDAGE: Surgically absent. + smoke in the ostium but no formed clot  RIGHT ATRIUM: Severely dilated  AORTIC VALVE:  Trileaflet. Mildly calcified. No AI/AS  MITRAL VALVE:    Normal. Moderate MR  TRICUSPID VALVE: Normal. Mild to moderate MR  PULMONIC VALVE: Grossly normal. Trivial PR  INTERATRIAL SEPTUM: No ASD or PFO  PERICARDIUM: No effusion  DESCENDING AORTA: Moderate plaque   CARDIOVERSION:     Indications:  Atrial Flutter  Procedure Details:  Once the TEE was complete, the patient had the defibrillator pads placed in the anterior and posterior position. Once an appropriate level of sedation was achieved, the patient received a single biphasic, synchronized 200J shock with prompt conversion to sinus rhythm. No apparent complications.  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 Hopefully  EF will improved with restoration of NSR  [name], MD  1:47 PM

## 2017-12-25 NOTE — Progress Notes (Addendum)
Pt admitted for cardioversion, states missed approximately 3 doses xarelto last week "because pharmacy was out", BP 128/105, per 12 lead EKG in SVT rate 144 with Acute MI STEMI.  Paged Dr. Sung Amabile..  MD to come to assess/speak with patient.  Dr. Sung Amabile did review ECG and states patient in atrial flutter.

## 2017-12-25 NOTE — Progress Notes (Signed)
  Echocardiogram Echocardiogram Transesophageal has been performed.  Maralyn Witherell L Androw 12/25/2017, 1:53 PM

## 2017-12-25 NOTE — Transfer of Care (Signed)
[id number] RayLew524Immediate Anesthesia Transfer of Care Note  Patient: [name] [name]  Procedure(s) Performed: CARDIOVERSION (N/A ) TRANSESOPHAGEAL ECHOCARDIOGRAM (TEE) (N/A )  Patient Location: PACU  Anesthesia Type:General  Level of Consciousness: awake and drowsy  Airway & Oxygen Therapy: Patient Spontanous Breathing and Patient connected to nasal cannula oxygen  Post-op Assessment: Report given to RN, Post -op Vital signs reviewed and stable and Patient moving all extremities  Post vital signs: Reviewed and stable  Last Vitals:  Vitals Value Taken Time  BP 99/61 12/25/2017  2:00 PM  Temp    Pulse 58 12/25/2017  2:04 PM  Resp 21 12/25/2017  2:04 PM  SpO2 93 % 12/25/2017  2:04 PM  Vitals shown include unvalidated device data.  Last Pain:  Vitals:   12/25/17 1348  TempSrc: Oral  PainSc: 0-No pain         Complications: No apparent anesthesia complications

## 2017-12-25 NOTE — Interval H&P Note (Signed)
History and Physical Interval Note:  12/25/2017 1:04 PM  [name] [name]  has presented today for surgery, with the diagnosis of A-FIB  The various methods of treatment have been discussed with the patient and family. After consideration of risks, benefits and other options for treatment, the patient has consented to  Procedure(s): CARDIOVERSION (N/A) TRANSESOPHAGEAL ECHOCARDIOGRAM (TEE) (N/A) as a surgical intervention .  [name] history has been reviewed, patient examined, no change in status, stable for surgery.  I have reviewed the patient's chart and labs.  Questions were answered to the patient's satisfaction.     Lexie Koehl

## 2017-12-25 NOTE — Progress Notes (Signed)
EKG CRITICAL VALUE     12 lead EKG performed.  Critical value noted.  Burtis Junes, RN notified.   Radene Gunning, CCT 12/25/2017 12:01 PM

## 2017-12-25 NOTE — Discharge Instructions (Signed)

## 2017-12-27 ENCOUNTER — Telehealth: Payer: Self-pay | Admitting: Licensed Clinical Social Worker

## 2017-12-27 ENCOUNTER — Inpatient Hospital Stay (HOSPITAL_COMMUNITY): Admission: RE | Admit: 2017-12-27 | Payer: PPO | Source: Ambulatory Visit

## 2017-12-27 NOTE — Telephone Encounter (Signed)
CSW contacted patient to follow up on no show today in the clinic. Patient reports she was unaware of clinic visit today and "getting very confused with all these appointments". CSW will have staff return call to reschedule appointment. [name] [name], [place], [place]

## 2017-12-28 ENCOUNTER — Telehealth (HOSPITAL_COMMUNITY): Payer: Self-pay

## 2017-12-28 ENCOUNTER — Telehealth (HOSPITAL_COMMUNITY): Payer: Self-pay | Admitting: *Deleted

## 2017-12-28 ENCOUNTER — Other Ambulatory Visit (HOSPITAL_COMMUNITY): Payer: Self-pay

## 2017-12-28 DIAGNOSIS — R55 Syncope and collapse: Secondary | ICD-10-CM | POA: Diagnosis not present

## 2017-12-28 DIAGNOSIS — R404 Transient alteration of awareness: Secondary | ICD-10-CM | POA: Diagnosis not present

## 2017-12-28 NOTE — Anesthesia Postprocedure Evaluation (Signed)
Anesthesia Post Note  Patient: [name] [name]  Procedure(s) Performed: CARDIOVERSION (N/A ) TRANSESOPHAGEAL ECHOCARDIOGRAM (TEE) (N/A )     Patient location during evaluation: Endoscopy Anesthesia Type: General Level of consciousness: awake Pain management: pain level controlled Vital Signs Assessment: post-procedure vital signs reviewed and stable Respiratory status: spontaneous breathing Cardiovascular status: stable Postop Assessment: no apparent nausea or vomiting Anesthetic complications: no    Last Vitals:  Vitals:   12/25/17 1400 12/25/17 1410  BP: 99/61 108/65  Pulse: (!) 58 62  Resp: (!) 29 (!) 32  Temp:    SpO2: 95% 94%    Last Pain:  Vitals:   12/25/17 1410  TempSrc:   PainSc: 0-No pain   Pain Goal:                 [name] [name],[name] [name]

## 2017-12-28 NOTE — Progress Notes (Signed)
Paramedicine Encounter    Patient ID: [name] [name], female    DOB: [birth year], 72 y.o.   MRN: [id number]   Patient Care Team: [name], MD as PCP - General (Internal Medicine) Debara Pickett Nadean Corwin, MD as PCP - Cardiology (Cardiology) Debara Pickett Nadean Corwin, MD as Consulting Physician (Cardiology)  Patient Active Problem List   Diagnosis Date Noted  . Acute on chronic combined systolic and diastolic CHF (congestive heart failure) ([place])   . CHF exacerbation ([place]) 10/16/2017  . Noncompliance 10/16/2017  . Acute on chronic congestive heart failure ([place])   . Influenza with respiratory manifestation flu a + 09/14/16 started on tamiflu 09/15/17 09/19/2017  . Thrombocytopenia ([place]) 09/14/2017  . Acute drug-induced gout of right foot   . Medication noncompliance due to cognitive impairment 09/06/2017  . Acute diastolic CHF (congestive heart failure) ([place]) 08/06/2017  . LGI bleed, likely hemorrhoids 08/06/2017  . Atrial fibrillation ([place]) 08/04/2017  . Paroxysmal atrial fibrillation ([place]) 02/08/2017  . Cardiomyopathy, ischemic 02/08/2017  . Dysphagia 02/08/2017  . CKD (chronic kidney disease), stage III ([place]) 01/30/2017  . Pain in shoulder 02/08/2016  . Breast pain, left 02/08/2016  . Bilateral arm numbness and tingling while sleeping 02/08/2016  . Painful lumpy left breast 09/23/2015  . [place] intertrigo 02/11/2015  . S/P CABG x 2 11/14/2014  . Accelerated hypertension   . Cardiac arrest ([place]) 11/04/2014  . Left main coronary artery disease 11/04/2014  . Coronary artery disease due to lipid rich plaque   . Acute respiratory failure with hypoxemia ([place])   . Essential hypertension   . Atrial fibrillation with RVR ([place])   . Hypokalemia 10/30/2014  . Chronic diastolic CHF (congestive heart failure) ([place]) 10/30/2014  . NSTEMI (non-ST elevated myocardial infarction) ([place])   . DOE (dyspnea on exertion)   . CAP (community acquired pneumonia) 10/29/2014    Current Outpatient Medications:  .   amiodarone (PACERONE) 200 MG tablet, Take 2 tablets (400 mg total) by mouth 2 (two) times daily for 7 days. Then decrease to 200 mg Twice daily, Disp: 120 tablet, Rfl: 3 .  aspirin EC 81 MG EC tablet, Take 1 tablet (81 mg total) by mouth daily., Disp: 30 tablet, Rfl: 0 .  atorvastatin (LIPITOR) 10 MG tablet, Take 10 mg by mouth daily., Disp: , Rfl:  .  fluticasone (FLONASE) 50 MCG/ACT nasal spray, Place 1 spray into both nostrils daily. (Patient taking differently: Place 1 spray into both nostrils daily as needed for allergies. ), Disp: 16 g, Rfl: 2 .  furosemide (LASIX) 20 MG tablet, TAKE 3 TABLETS (60 MG TOTAL) BY MOUTH 2 (TWO) TIMES DAILY., Disp: 180 tablet, Rfl: 11 .  KLOR-CON M20 20 MEQ tablet, TAKE 1 TABLET BY MOUTH TWICE A DAY, Disp: 60 tablet, Rfl: 11 .  magnesium oxide (MAG-OX) 400 MG tablet, Take 1 tablet (400 mg total) by mouth 3 (three) times daily., Disp: 270 tablet, Rfl: 3 .  metoprolol succinate (TOPROL-XL) 100 MG 24 hr tablet, Take 1 tablet (100 mg total) by mouth 2 (two) times daily. Take with or immediately following a meal., Disp: 60 tablet, Rfl: 5 .  ranitidine (ZANTAC) 150 MG capsule, Take 150 mg by mouth 2 (two) times daily as needed for heartburn. , Disp: , Rfl:  .  rivaroxaban (XARELTO) 20 MG TABS tablet, Take 1 tablet (20 mg total) by mouth daily with supper., Disp: 90 tablet, Rfl: 1 .  sacubitril-valsartan (ENTRESTO) 24-26 MG, Take 1 tablet by mouth 2 (two) times daily., Disp: 60  tablet, Rfl: 3 .  acetaminophen (TYLENOL) 500 MG tablet, Take 500 mg by mouth every 6 (six) hours as needed for moderate pain. , Disp: , Rfl:  .  budesonide (PULMICORT) 0.5 MG/2ML nebulizer solution, Take 2 mLs (0.5 mg total) by nebulization 2 (two) times daily. (Patient not taking: Reported on 12/28/2017), Disp: , Rfl: 12 .  levalbuterol (XOPENEX) 0.63 MG/3ML nebulizer solution, Take 3 mLs (0.63 mg total) by nebulization every 6 (six) hours as needed for wheezing or shortness of breath. (Patient not  taking: Reported on 12/28/2017), Disp: 3 mL, Rfl: 12 Allergies  Allergen Reactions  . Bee Venom Anaphylaxis  . Codeine Nausea And Vomiting  . Shrimp [Shellfish Allergy] Swelling  . Tomato     Pt states it gives her gout      Social History   Socioeconomic History  . Marital status: Widowed    Spouse name: Not on file  . Number of children: Not on file  . Years of education: Not on file  . Highest education level: Not on file  Occupational History  . Not on file  Social Needs  . Financial resource strain: Not on file  . Food insecurity:    Worry: Not on file    Inability: Not on file  . Transportation needs:    Medical: Not on file    Non-medical: Not on file  Tobacco Use  . Smoking status: Former Smoker    Packs/day: 0.10    Years: 56.00    Pack years: 5.60    Types: Cigarettes  . Smokeless tobacco: Never Used  Substance and Sexual Activity  . Alcohol use: No    Alcohol/week: 0.0 oz  . Drug use: No  . Sexual activity: Not on file  Lifestyle  . Physical activity:    Days per week: Not on file    Minutes per session: Not on file  . Stress: Not on file  Relationships  . Social connections:    Talks on phone: Not on file    Gets together: Not on file    Attends religious service: Not on file    Active member of club or organization: Not on file    Attends meetings of clubs or organizations: Not on file    Relationship status: Not on file  . Intimate partner violence:    Fear of current or ex partner: Not on file    Emotionally abused: Not on file    Physically abused: Not on file    Forced sexual activity: Not on file  Other Topics Concern  . Not on file  Social History Narrative  . Not on file    Physical Exam  Constitutional: She is oriented to person, place, and time.  Cardiovascular: An irregularly irregular rhythm present. Bradycardia present.  Pulmonary/Chest: She is in respiratory distress. She has decreased breath sounds in the right lower field  and the left lower field.  Abdominal: Soft.  Musculoskeletal: Normal range of motion. She exhibits edema.  Neurological: She is alert and oriented to person, place, and time.  Skin: Skin is warm and dry.  Psychiatric: She has a normal mood and affect.        Future Appointments  Date Time Provider [place]  01/04/2018  1:30 PM [facility] PA/NP [facility] None    BP 130/78 (BP Location: Left Arm, Patient Position: Sitting, Cuff Size: Large)   Pulse (!) 56   Resp 16   Wt 200 lb 6.4 oz (90.9 kg)  SpO2 92%   BMI 30.03 kg/m   Weight yesterday- N/A Last visit weight- N/A  Ms Lahmann was seen at home today for the first time. She stated she needs help with her medications and learning how to eat the proper diet for her condition. I Explained that I am there to help with those two things and many others. She stated she had been taking her medications but it was unclear on which medications she was taking correctly as her pillbox was in disarray. I emptied the pillbox, verified her medications and refilled it to ensure no errors were made. She was unclear on if she was still supposed to be on amiodarone so I contacted the HF clinic who requested I obtain an ECG to determine her rhythm. She had recently been cardioverted but was found to be back in atrial fibrillation at a rate in the 50's. She was still taking her xarelto so the clinic advised that she be placed back on 400 mg of amiodarone BID until her appointment next week. We went through her [facility] folder and I explained how she should manage her fluid and food intake, use her weight chart and then we filled out her contact information sheet. She did not have any questions during this visit but I requested she contact me if she thinks of anything before next week.   Time spent with patient: 64 minutes  [name], EMT 12/28/17  ACTION: Home visit completed Next visit planned for 1 week

## 2017-12-28 NOTE — Telephone Encounter (Signed)
[name] with paramedicine called to confirm amiodarone dose. He wasn't sure if she should still be taking it  Patient has been taking 400mg  twice daily. He did an EKG it showed afib but it is controlled. Per Jonni Sanger continue amio and keep follow up appt next week with Amy. [name] aware and agreeable with plan.

## 2017-12-28 NOTE — Telephone Encounter (Signed)
I called [name] [name] to schedule out initial appointment. She was initially confuse and wanted to wait until next week to meet however I was able to clear up my role and explain that I work as an extension of the HF clinic and she felt more comfortable. She agreed to meet me today at 15:00.

## 2017-12-29 ENCOUNTER — Telehealth: Payer: Self-pay

## 2017-12-29 NOTE — Telephone Encounter (Signed)
Spoke to patient's daughter about cancelled apt. Daughter says that mother would benefit from someone coming to the house and helping her organize medication. Care Guide will look into community resource.

## 2018-01-02 ENCOUNTER — Ambulatory Visit: Payer: PPO

## 2018-01-03 DIAGNOSIS — I10 Essential (primary) hypertension: Secondary | ICD-10-CM | POA: Diagnosis not present

## 2018-01-03 DIAGNOSIS — I5020 Unspecified systolic (congestive) heart failure: Secondary | ICD-10-CM | POA: Diagnosis not present

## 2018-01-03 DIAGNOSIS — I4891 Unspecified atrial fibrillation: Secondary | ICD-10-CM | POA: Diagnosis not present

## 2018-01-03 DIAGNOSIS — N183 Chronic kidney disease, stage 3 (moderate): Secondary | ICD-10-CM | POA: Diagnosis not present

## 2018-01-03 DIAGNOSIS — N939 Abnormal uterine and vaginal bleeding, unspecified: Secondary | ICD-10-CM | POA: Diagnosis not present

## 2018-01-03 DIAGNOSIS — I25810 Atherosclerosis of coronary artery bypass graft(s) without angina pectoris: Secondary | ICD-10-CM | POA: Diagnosis not present

## 2018-01-03 NOTE — Progress Notes (Signed)
Advanced Heart Failure Clinic Consult Note   PCP: [name], MD PCP-Cardiologist: [name], MD  HF: Dr Haroldine Laws   HPI:  [name] [name] is a 72 y.o. female with h/o HTN, Tobacco abuse, PAF, CAD s/p CABG x 2 (LIMA to LAD and SVG to OM) in 2016, Chronic combined CHF, Ischemic CMP, HLD, and DOE.   She was initially noted to be in atrial fibrillation in 2016 with a reduced LVEF, leading to ischemic workup with a cardiac cath showing severe multivessel CAD with LM involvement in which she underwent CABG x2 utilizing LIMA to LAD, SVG to OM.   Admitted 1/19 for AF with RVR in setting of Influenza A. During that hospitalization, she was started on diltiazem drip, EP was consulted to discuss plans for Tikosyn vs. ablation vs. medication rate control however, due to her known medication noncompliance she was found to not be an ablation nor antiarrhythmic candidate  Pt admitted 3/3 - 10/20/17 with recurrent Afib RVR and ADHF. Started on diltiazem. Echo repeated which showed fall in EF from previous. Diuresed with IV lasix with 5L negative for admission. Discharged home on diltiazem 360, Torpol 100, digoxin and Xarelto.  Seen by Dr. Debara Pickett 11/20/17, was feeling well overall. Of note, she reported not remembering her recent hospitalization. She was also noted to be out of her metoprolol for approximately a month.   Last visit, she was found to be in Afib. She was started on amiodarone and diltiazem was DCd. She was also started on Entresto. She underwent successful atrial flutter TEE/DCCV on 12/25/17. She was back in rate controlled afib on 5/16 during paramedic visit. She presents today for regular follow up. Overall feeling okay. She saw her PCP yesterday for vaginal bleeding. He did a pelvic exam and referred her to gynecology. She was told to hold her xarelto x2 days. Denies SOB with walking. Denies orthopnea/PND/edema. No CP or dizziness. Ongoing fatigue. Good appetite. She is drinking a lot of  fluid. She is not weighing, but has a scale. She missed 4 doses of medication over the last week. HF paramedicine following and filling her pill box.   Echo in 3/16 EF 35-40%. Echo in 10/17 EF improved to 50-55%.  F/u echo 5/18 EF stable 50-55% Echo 10/19/17 LVEF 30-35%, Moderate LVH, Mild AI, Moderate regurgitation, Severe LAE, Moderately reduced RV, Severe RAE, PA peak pressure 50 mm Hg.   (Decreased from Echo 01/11/2017 with LVEF 50-55%)  Review of systems complete and found to be negative unless listed in HPI.   Past Medical History:  Diagnosis Date  . Acute respiratory failure ([place]) 08/2017  . Arthritis   . Asthma    ??  . CAD (coronary artery disease)    a. s/p CABG 2016.  . Cardiomyopathy, ischemic    a. EF previously low, improved to LVEF 50-55% as of May 2018  . Chronic combined systolic and diastolic heart failure ([place])   . CKD (chronic kidney disease), stage III ([place])   . Gout   . Hypertension   . Hypokalemia   . LGI bleed 08/06/2017   a. felt to be hemorrhoidal during that admission (no drop in Hgb).  . Persistent atrial fibrillation ([facility])    a. h/o difficult to control rates (complicated by noncompliance), not felt to be a candidate for ablation or antiarrhythmic due to noncompliance.  . Personal history of noncompliance with medical treatment, presenting hazards to health   . S/P CABG x 2 with clipping of LA appendage  11/14/2014   LIMA to LAD, SVG to OM, EVH via right thigh   Current Outpatient Medications  Medication Sig Dispense Refill  . amiodarone (PACERONE) 200 MG tablet Take 2 tablets (400 mg total) by mouth 2 (two) times daily for 7 days. Then decrease to 200 mg Twice daily 120 tablet 3  . aspirin EC 81 MG EC tablet Take 1 tablet (81 mg total) by mouth daily. 30 tablet 0  . atorvastatin (LIPITOR) 10 MG tablet Take 10 mg by mouth daily.    . febuxostat ([name]) 40 MG tablet Take 40 mg by mouth daily.    . furosemide (LASIX) 20 MG tablet TAKE 3 TABLETS (60 MG  TOTAL) BY MOUTH 2 (TWO) TIMES DAILY. 180 tablet 11  . KLOR-CON M20 20 MEQ tablet TAKE 1 TABLET BY MOUTH TWICE A DAY 60 tablet 11  . magnesium oxide (MAG-OX) 400 MG tablet Take 1 tablet (400 mg total) by mouth 3 (three) times daily. 270 tablet 3  . metoprolol succinate (TOPROL-XL) 100 MG 24 hr tablet Take 1 tablet (100 mg total) by mouth 2 (two) times daily. Take with or immediately following a meal. 60 tablet 5  . ranitidine (ZANTAC) 150 MG capsule Take 150 mg by mouth 2 (two) times daily as needed for heartburn.     . rivaroxaban (XARELTO) 20 MG TABS tablet Take 1 tablet (20 mg total) by mouth daily with supper. 90 tablet 1  . sacubitril-valsartan (ENTRESTO) 24-26 MG Take 1 tablet by mouth 2 (two) times daily. 60 tablet 3  . acetaminophen (TYLENOL) 500 MG tablet Take 500 mg by mouth every 6 (six) hours as needed for moderate pain.     . budesonide (PULMICORT) 0.5 MG/2ML nebulizer solution Take 2 mLs (0.5 mg total) by nebulization 2 (two) times daily. (Patient not taking: Reported on 12/28/2017)  12  . fluticasone (FLONASE) 50 MCG/ACT nasal spray Place 1 spray into both nostrils daily. (Patient not taking: Reported on 01/04/2018) 16 g 2  . levalbuterol (XOPENEX) 0.63 MG/3ML nebulizer solution Take 3 mLs (0.63 mg total) by nebulization every 6 (six) hours as needed for wheezing or shortness of breath. (Patient not taking: Reported on 12/28/2017) 3 mL 12   No current facility-administered medications for this encounter.    Allergies  Allergen Reactions  . Bee Venom Anaphylaxis  . Codeine Nausea And Vomiting  . Shrimp [Shellfish Allergy] Swelling  . Tomato     Pt states it gives her gout   Social History   Socioeconomic History  . Marital status: Widowed    Spouse name: Not on file  . Number of children: Not on file  . Years of education: Not on file  . Highest education level: Not on file  Occupational History  . Not on file  Social Needs  . Financial resource strain: Not on file  . Food  insecurity:    Worry: Not on file    Inability: Not on file  . Transportation needs:    Medical: Not on file    Non-medical: Not on file  Tobacco Use  . Smoking status: Former Smoker    Packs/day: 0.10    Years: 56.00    Pack years: 5.60    Types: Cigarettes  . Smokeless tobacco: Never Used  Substance and Sexual Activity  . Alcohol use: No    Alcohol/week: 0.0 oz  . Drug use: No  . Sexual activity: Not on file  Lifestyle  . Physical activity:    Days per week:  Not on file    Minutes per session: Not on file  . Stress: Not on file  Relationships  . Social connections:    Talks on phone: Not on file    Gets together: Not on file    Attends religious service: Not on file    Active member of club or organization: Not on file    Attends meetings of clubs or organizations: Not on file    Relationship status: Not on file  . Intimate partner violence:    Fear of current or ex partner: Not on file    Emotionally abused: Not on file    Physically abused: Not on file    Forced sexual activity: Not on file  Other Topics Concern  . Not on file  Social History Narrative  . Not on file   Family History  Problem Relation Age of Onset  . Cancer Mother   . Heart disease Father    Vitals:   01/04/18 1354  BP: (!) 146/82  Pulse: (!) 47  SpO2: 98%   Wt Readings from Last 3 Encounters:  12/28/17 200 lb 6.4 oz (90.9 kg)  12/13/17 225 lb (102.1 kg)  12/08/17 199 lb 4 oz (90.4 kg)    PHYSICAL EXAM: General: No resp difficulty. Walked in with a cane.  HEENT: Normal Neck: Supple. JVP 5-6. Carotids 2+ bilat; no bruits. No thyromegaly or nodule noted. Cor: PMI nondisplaced. IRR, No M/G/R noted Lungs: CTAB, normal effort. Abdomen: Soft, non-tender, non-distended, no HSM. No bruits or masses. +BS  Extremities: No cyanosis, clubbing, or rash. R and LLE trace edema in ankles Neuro: Alert & orientedx3, cranial nerves grossly intact. moves all 4 extremities w/o difficulty. Affect  pleasant  ECG: Sinus brady 47 bpm  ASSESSMENT & PLAN:  1. Chronic combined CHF - Suspect etiology is combined Ischemic/NICM with Afib/RVR for nearly a year.   - Echo 10/19/17 LVEF 30-35%, Moderate LVH, Mild AI, Moderate regurgitation, Severe LAE, Moderately reduced RV, Severe RAE, PA peak pressure 50 mm Hg.   (Decreased from Echo 01/11/2017 with LVEF 50-55%) - NYHA III symptoms - Volume status stable to mildly elevated - Continue lasix 60 mg BID.  - Decrease Toprol XL to 100 mg daily - Contnue digoxin 0.125 mg daily. Dig level 0.5 10/2017 - Continue Entresto 24/26 mg BID.  - Start spiro 12.5 mg daily. BMET in 7-10 days.  - Reinforced fluid restriction to < 2 L daily, sodium restriction to less than 2000 mg daily, and the importance of daily weights. Discussed weighing daily when she wakes up.  2. CAD s/p CABG x 2 2016 (LIMA to LAD and SVG to OM) - No s/s ischemia - Continue statin. Stop ASA (on xarelto). - Switch uloric to allopurinol 300 mg daily. Recent study published (CARES trial) that showed an increased risk of death with uloric in patients with CAD.   3. PAF s/p TEE/DCCV on 12/25/17 - CHA2DS2-VASc is at least 5.  - EKG today shows SB 47 bpm - She had some vaginal bleeding earlier this week and saw PCP, who referred her to [facility]. Denies blood in urine or stool. Okay to restart Xarelto for now. Stop aspirin. Check CBC today.  - Decrease amiodarone to 200 mg BID - No diltiazem with low EF.    4. HTN - Elevated today. Start spiro as above.   5. Tobacco Abuse - Stopped smoking 10/2017. Continued cessation.   6. HLD - Per [facility]. No change.   7. Suspected OSA/Snoring -  She had a sleep study last month and was told she needs to come back for another one. Unable to locate study in epic. She cannot afford to pay for another sleep study right now.   8. Social  - She has poor insight into her disease.  - Now followed by HF paramedicine - She met with Kennyth Lose, SW today.   Labs  today DC aspirin Decrease Toprol XL to 100 mg daily Decrease amiodarone to 200 mg BID Start spiro 12.5 mg daily. BMET in 7-10 days DC uloric and start allopurinol 300 mg daily.  EKG by paramedicine next week.  Follow up in 4 weeks  Georgiana Shore, NP 01/04/18   Greater than 50% of the 25 minute visit was spent in counseling/coordination of care regarding disease state education, salt/fluid restriction, sliding scale diuretics, and medication compliance.

## 2018-01-04 ENCOUNTER — Other Ambulatory Visit (HOSPITAL_COMMUNITY): Payer: Self-pay

## 2018-01-04 ENCOUNTER — Ambulatory Visit (HOSPITAL_COMMUNITY)
Admission: RE | Admit: 2018-01-04 | Discharge: 2018-01-04 | Disposition: A | Discharge status: Home / Self Care | Payer: PPO | Source: Ambulatory Visit | Attending: Cardiology | Admitting: Cardiology

## 2018-01-04 VITALS — BP 146/82 | HR 47

## 2018-01-04 DIAGNOSIS — I4891 Unspecified atrial fibrillation: Secondary | ICD-10-CM | POA: Diagnosis not present

## 2018-01-04 DIAGNOSIS — Z951 Presence of aortocoronary bypass graft: Secondary | ICD-10-CM | POA: Insufficient documentation

## 2018-01-04 DIAGNOSIS — Z9114 Patient's other noncompliance with medication regimen: Secondary | ICD-10-CM | POA: Diagnosis not present

## 2018-01-04 DIAGNOSIS — I10 Essential (primary) hypertension: Secondary | ICD-10-CM | POA: Diagnosis not present

## 2018-01-04 DIAGNOSIS — N939 Abnormal uterine and vaginal bleeding, unspecified: Secondary | ICD-10-CM | POA: Insufficient documentation

## 2018-01-04 DIAGNOSIS — N950 Postmenopausal bleeding: Secondary | ICD-10-CM | POA: Diagnosis not present

## 2018-01-04 DIAGNOSIS — E876 Hypokalemia: Secondary | ICD-10-CM | POA: Diagnosis not present

## 2018-01-04 DIAGNOSIS — I130 Hypertensive heart and chronic kidney disease with heart failure and stage 1 through stage 4 chronic kidney disease, or unspecified chronic kidney disease: Secondary | ICD-10-CM | POA: Diagnosis not present

## 2018-01-04 DIAGNOSIS — I480 Paroxysmal atrial fibrillation: Secondary | ICD-10-CM | POA: Insufficient documentation

## 2018-01-04 DIAGNOSIS — E785 Hyperlipidemia, unspecified: Secondary | ICD-10-CM | POA: Insufficient documentation

## 2018-01-04 DIAGNOSIS — N183 Chronic kidney disease, stage 3 (moderate): Secondary | ICD-10-CM | POA: Diagnosis not present

## 2018-01-04 DIAGNOSIS — R001 Bradycardia, unspecified: Secondary | ICD-10-CM | POA: Diagnosis not present

## 2018-01-04 DIAGNOSIS — I2510 Atherosclerotic heart disease of native coronary artery without angina pectoris: Secondary | ICD-10-CM | POA: Diagnosis not present

## 2018-01-04 DIAGNOSIS — I4892 Unspecified atrial flutter: Secondary | ICD-10-CM | POA: Diagnosis not present

## 2018-01-04 DIAGNOSIS — I5042 Chronic combined systolic (congestive) and diastolic (congestive) heart failure: Secondary | ICD-10-CM | POA: Insufficient documentation

## 2018-01-04 DIAGNOSIS — I481 Persistent atrial fibrillation: Secondary | ICD-10-CM | POA: Diagnosis not present

## 2018-01-04 DIAGNOSIS — M109 Gout, unspecified: Secondary | ICD-10-CM | POA: Diagnosis not present

## 2018-01-04 DIAGNOSIS — Z7982 Long term (current) use of aspirin: Secondary | ICD-10-CM | POA: Insufficient documentation

## 2018-01-04 DIAGNOSIS — R0683 Snoring: Secondary | ICD-10-CM | POA: Diagnosis not present

## 2018-01-04 DIAGNOSIS — I255 Ischemic cardiomyopathy: Secondary | ICD-10-CM | POA: Diagnosis present

## 2018-01-04 DIAGNOSIS — Z87891 Personal history of nicotine dependence: Secondary | ICD-10-CM | POA: Insufficient documentation

## 2018-01-04 DIAGNOSIS — Z79899 Other long term (current) drug therapy: Secondary | ICD-10-CM | POA: Insufficient documentation

## 2018-01-04 MED ORDER — ALLOPURINOL 300 MG PO TABS: 300.0000 mg | ORAL_TABLET | Freq: Every day | ORAL | 11 refills | Status: DC

## 2018-01-04 MED ORDER — METOPROLOL SUCCINATE ER 100 MG PO TB24
100.0000 mg | ORAL_TABLET | Freq: Every day | ORAL | 11 refills | Status: DC
Start: 2018-01-04 — End: 2018-01-11

## 2018-01-04 MED ORDER — SPIRONOLACTONE 25 MG PO TABS: 12.5000 mg | ORAL_TABLET | Freq: Every day | ORAL | 11 refills | Status: DC

## 2018-01-04 MED ORDER — AMIODARONE HCL 200 MG PO TABS: 200.0000 mg | ORAL_TABLET | Freq: Two times a day (BID) | ORAL | 11 refills | Status: DC

## 2018-01-04 NOTE — Addendum Note (Signed)
Encounter addended by: Louann Liv, LCSW on: 01/04/2018 4:44 PM  Actions taken: Sign clinical note

## 2018-01-04 NOTE — Progress Notes (Signed)
Paramedicine Encounter   Patient ID: JAKE GOODSON , female,   DOB: 02-20-1946,72 y.o.,  MRN: [id number]  Ms Gardiner was seen at the HF clinic today and reported feeling generally well. She had converted into a sinus rhythm, albeit sinus bradycardia, after being found in atrial fibrillation last week. She reported having vaginal bleeding that began two days ago but stopped yesterday. She saw he PCP regarding this and he told her to stop taking Xarelto and referred her to a gynecologist. Per Lillia Mountain NP, she is to restart Xarelto, stop ASA, decrease metoprolol tp 100 mg daily and decrease amiodarone to 200 mg BID. The clinic has requested an ECG be performed next week to ensure that she remains in a sinus rhythm. She was given information on how to follow a low sodium diet today as well. Delene Loll and Xarelto were ordered from the pharmacy. Delene Loll is present in her pillbox through Tuesday night. I will have either Dee or Katie follow up since I will be out of town.   Time spent with patient: 45 minutes  [name], EMT 01/04/2018   ACTION: Next visit planned for 2 weeks

## 2018-01-04 NOTE — Progress Notes (Signed)
CSW met briefly with patient to introduce self and welcome to the [facility] program. Patient states she has medications and scale and learning more about Heart Failure through her visits with the paramedic. Patient denies any concerns at this time and provided CSW number if needed. CSW continues to follow through the [facility] as needed. Jackie , LCSW, CCSW-MCS [phone]  

## 2018-01-04 NOTE — Patient Instructions (Addendum)
STOP [name].  STOP Aspirin.  START Allopurinol to 300 mg once daily.  DECREASE Toprol to 100 mg tablet once daily.  DECREASE Amiodarone 200 mg twice daily.  START Spironolactone 12.5 mg (1/2 tablet) once daily.  RESTART Xarelto tomorrow.  Routine lab work today. Will notify you of abnormal results, otherwise no news is good news!  [name] to come to home for EKG next week.  Return for labs in 1-2 weeks.  ______________________________________________________________ [name] [name] Code:   Follow up 4 weeks.  _______________________________________________________________ [name] [name] Code: 5809  Take all medication as prescribed the day of your appointment. Bring all medications with you to your appointment.  Do the following things EVERYDAY: 1) Weigh yourself in the morning before breakfast. Write it down and keep it in a log. 2) Take your medicines as prescribed 3) Eat low salt foods-Limit salt (sodium) to 2000 mg per day.  4) Stay as active as you can everyday 5) Limit all fluids for the day to less than 2 liters

## 2018-01-10 ENCOUNTER — Other Ambulatory Visit (HOSPITAL_COMMUNITY): Payer: Self-pay

## 2018-01-10 ENCOUNTER — Telehealth (HOSPITAL_COMMUNITY): Payer: Self-pay | Admitting: *Deleted

## 2018-01-10 DIAGNOSIS — I4891 Unspecified atrial fibrillation: Secondary | ICD-10-CM

## 2018-01-10 NOTE — Telephone Encounter (Signed)
Called Katie back but left message to call me back.

## 2018-01-10 NOTE — Progress Notes (Signed)
Paramedicine Encounter    Patient ID: [name] [name], female    DOB: 1946-07-26, 72 y.o.   MRN: [id number]   Patient Care Team: [name], MD as PCP - General (Internal Medicine) Debara Pickett Nadean Corwin, MD as PCP - Cardiology (Cardiology) Debara Pickett Nadean Corwin, MD as Consulting Physician (Cardiology)  Patient Active Problem List   Diagnosis Date Noted  . Postmenopausal vaginal bleeding 01/04/2018  . Snoring 01/04/2018  . Acute on chronic combined systolic and diastolic CHF (congestive heart failure) ([place])   . CHF exacerbation ([place]) 10/16/2017  . Noncompliance 10/16/2017  . Acute on chronic congestive heart failure ([place])   . Influenza with respiratory manifestation flu a + 09/14/16 started on tamiflu 09/15/17 09/19/2017  . Thrombocytopenia ([place]) 09/14/2017  . Acute drug-induced gout of right foot   . Medication noncompliance due to cognitive impairment 09/06/2017  . Acute diastolic CHF (congestive heart failure) ([place]) 08/06/2017  . LGI bleed, likely hemorrhoids 08/06/2017  . Atrial fibrillation ([place]) 08/04/2017  . Paroxysmal atrial fibrillation ([place]) 02/08/2017  . Cardiomyopathy, ischemic 02/08/2017  . Dysphagia 02/08/2017  . CKD (chronic kidney disease), stage III ([place]) 01/30/2017  . Pain in shoulder 02/08/2016  . Breast pain, left 02/08/2016  . Bilateral arm numbness and tingling while sleeping 02/08/2016  . Painful lumpy left breast 09/23/2015  . Candidal intertrigo 02/11/2015  . S/P CABG x 2 11/14/2014  . Accelerated hypertension   . Cardiac arrest ([place]) 11/04/2014  . Left main coronary artery disease 11/04/2014  . Coronary artery disease due to lipid rich plaque   . Acute respiratory failure with hypoxemia ([place])   . Essential hypertension   . Atrial fibrillation with RVR ([place])   . Hypokalemia 10/30/2014  . Chronic diastolic CHF (congestive heart failure) ([place]) 10/30/2014  . NSTEMI (non-ST elevated myocardial infarction) ([place])   . DOE (dyspnea on exertion)   . CAP (community  acquired pneumonia) 10/29/2014    Current Outpatient Medications:  .  acetaminophen (TYLENOL) 500 MG tablet, Take 500 mg by mouth every 6 (six) hours as needed for moderate pain. , Disp: , Rfl:  .  allopurinol (ZYLOPRIM) 300 MG tablet, Take 1 tablet (300 mg total) by mouth daily., Disp: 30 tablet, Rfl: 11 .  amiodarone (PACERONE) 200 MG tablet, Take 1 tablet (200 mg total) by mouth 2 (two) times daily., Disp: 60 tablet, Rfl: 11 .  atorvastatin (LIPITOR) 10 MG tablet, Take 10 mg by mouth daily., Disp: , Rfl:  .  furosemide (LASIX) 20 MG tablet, TAKE 3 TABLETS (60 MG TOTAL) BY MOUTH 2 (TWO) TIMES DAILY., Disp: 180 tablet, Rfl: 11 .  KLOR-CON M20 20 MEQ tablet, TAKE 1 TABLET BY MOUTH TWICE A DAY, Disp: 60 tablet, Rfl: 11 .  magnesium oxide (MAG-OX) 400 MG tablet, Take 1 tablet (400 mg total) by mouth 3 (three) times daily., Disp: 270 tablet, Rfl: 3 .  metoprolol succinate (TOPROL-XL) 100 MG 24 hr tablet, Take 1 tablet (100 mg total) by mouth daily. Take with or immediately following a meal., Disp: 30 tablet, Rfl: 11 .  ranitidine (ZANTAC) 150 MG capsule, Take 150 mg by mouth 2 (two) times daily as needed for heartburn. , Disp: , Rfl:  .  rivaroxaban (XARELTO) 20 MG TABS tablet, Take 1 tablet (20 mg total) by mouth daily with supper., Disp: 90 tablet, Rfl: 1 .  sacubitril-valsartan (ENTRESTO) 24-26 MG, Take 1 tablet by mouth 2 (two) times daily., Disp: 60 tablet, Rfl: 3 .  budesonide (PULMICORT) 0.5 MG/2ML nebulizer solution, Take 2  mLs (0.5 mg total) by nebulization 2 (two) times daily. (Patient not taking: Reported on 12/28/2017), Disp: , Rfl: 12 .  fluticasone (FLONASE) 50 MCG/ACT nasal spray, Place 1 spray into both nostrils daily. (Patient not taking: Reported on 01/04/2018), Disp: 16 g, Rfl: 2 .  levalbuterol (XOPENEX) 0.63 MG/3ML nebulizer solution, Take 3 mLs (0.63 mg total) by nebulization every 6 (six) hours as needed for wheezing or shortness of breath. (Patient not taking: Reported on  12/28/2017), Disp: 3 mL, Rfl: 12 .  spironolactone (ALDACTONE) 25 MG tablet, Take 0.5 tablets (12.5 mg total) by mouth daily. (Patient not taking: Reported on 01/10/2018), Disp: 15 tablet, Rfl: 11 Allergies  Allergen Reactions  . Bee Venom Anaphylaxis  . Codeine Nausea And Vomiting  . Shrimp [Shellfish Allergy] Swelling  . Tomato     Pt states it gives her gout      Social History   Socioeconomic History  . Marital status: Widowed    Spouse name: Not on file  . Number of children: Not on file  . Years of education: Not on file  . Highest education level: Not on file  Occupational History  . Not on file  Social Needs  . Financial resource strain: Not on file  . Food insecurity:    Worry: Not on file    Inability: Not on file  . Transportation needs:    Medical: Not on file    Non-medical: Not on file  Tobacco Use  . Smoking status: Former Smoker    Packs/day: 0.10    Years: 56.00    Pack years: 5.60    Types: Cigarettes  . Smokeless tobacco: Never Used  Substance and Sexual Activity  . Alcohol use: No    Alcohol/week: 0.0 oz  . Drug use: No  . Sexual activity: Not on file  Lifestyle  . Physical activity:    Days per week: Not on file    Minutes per session: Not on file  . Stress: Not on file  Relationships  . Social connections:    Talks on phone: Not on file    Gets together: Not on file    Attends religious service: Not on file    Active member of club or organization: Not on file    Attends meetings of clubs or organizations: Not on file    Relationship status: Not on file  . Intimate partner violence:    Fear of current or ex partner: Not on file    Emotionally abused: Not on file    Physically abused: Not on file    Forced sexual activity: Not on file  Other Topics Concern  . Not on file  Social History Narrative  . Not on file    Physical Exam      Future Appointments  Date Time Provider [place]  01/22/2018  1:30 PM [facility] LAB  [facility] None  02/01/2018  1:30 PM [facility] PA/NP [facility] None    BP (!) 142/76   Pulse (!) 48   Resp 14   SpO2 96%   Last visit weight-200?  Pt reports she has pain and swelling to her left knee causing her a lot of issues with the pain. She is wanting prednisone- she called dr Lysle Rubens but havent heard anything back yet and she called yesterday. She feels like this is either gout or arthritis.  She is out of her xarelto, entresto and she does not have spiro-she had lab appoint tomor but we cancelled that since  she didn't pick up the spiro yet--the entresto and the xarelto are very expensive.  I will have to go by and get samples from clinic for now. Not sure if any funding app or assistance was filled out for her to help with cost.  She states she has not applied for medicaid so that may be an option for her to look into.  Pt denies sob, no chest pains, no dizziness. HR 48, low energy but denies feeling too tired to do things around the house. Reported this to heart clinic.  --clinic called me back later on in the day to advise to cut back her metoprolol to 50mg  daily. That change will be done later this week once I get her meds and I will go back and correct that dose and finish her pill box.   Marylouise Stacks, [facility] Paramedic  01/10/18

## 2018-01-10 NOTE — Telephone Encounter (Signed)
Can start spiro. Can cut toprol back to 50 mg daily.     Legrand Como [address] [place], PA-C 01/10/2018 4:02 PM

## 2018-01-10 NOTE — Telephone Encounter (Signed)
Advanced Heart Failure Triage Encounter  Patient Name: [name] [name]  Date of Call: 01/10/18  Problem:  Katie with paramedicine called to report patient's EKG showed sinus brady at 28.  BP was 142/76.  Patient has not yet started spiro, will start this Friday.   Plan:  Will forward to Barrington Ellison, PA to review and see if any other med changes are needed at this time.   Darron Doom, RN

## 2018-01-10 NOTE — Telephone Encounter (Signed)
Advanced Heart Failure Triage Encounter  Patient Name: [name] [name]  Date of Call: 01/10/18  Problem:  Katie with paramedicine was seeing patient today and called to report that patient has NOT started spironolactone yet.  She was due for 7-10 day bmet but since she hasn't started it Joellen Jersey called to see if we needed to reschedule.  Joellen Jersey said patient she start it by Friday.    Plan:  Lab appointment rescheduled and patient is aware. Joellen Jersey will call if patient if unable to start medication by Friday.   Darron Doom, RN

## 2018-01-11 ENCOUNTER — Other Ambulatory Visit (HOSPITAL_COMMUNITY): Payer: PPO

## 2018-01-11 MED ORDER — METOPROLOL SUCCINATE ER 50 MG PO TB24: 50.0000 mg | ORAL_TABLET | Freq: Every day | ORAL | 3 refills | Status: DC

## 2018-01-11 NOTE — Telephone Encounter (Signed)
Medication Samples have been provided to the patient.  Drug name: Delene Loll      Strength: 24-26mg        Qty: 4 LOT: [id number] Exp.Date: 6/21  Dosing instructions: Take 1 Tablet twice daily  The patient has been instructed regarding the correct time, dose, and frequency of taking this medication, including desired effects and most common side effects.   [name] [name] 2:08 PM 01/11/2018

## 2018-01-11 NOTE — Telephone Encounter (Signed)
[name] [name] came by the clinic for xarelto and entresto samples.  She is aware to cupt toprol back to 50 mg daily.  MAR updated.

## 2018-01-11 NOTE — Telephone Encounter (Signed)
Medication Samples have been provided to the patient.  Drug name: Xarelto     Strength: 20mg        Qty: 4  LOT: [id number] Exp.Date: 4/21  Dosing instructions: Take 1 Tablet Once Daily  The patient has been instructed regarding the correct time, dose, and frequency of taking this medication, including desired effects and most common side effects.   Darron Doom 2:09 PM 01/11/2018

## 2018-01-12 ENCOUNTER — Other Ambulatory Visit (HOSPITAL_COMMUNITY): Payer: Self-pay

## 2018-01-12 NOTE — Progress Notes (Signed)
Came out for med rec pill box-pt p/u her spiro and I had to get samples of xarelto and entresto from clinic for her due to the cost from her pharmacy.  Med change to metoprolol to cut back to 50mg  daily.  Spiro/xarelto/entresto placed in pill box.   Marylouise Stacks, EMT-Paramedic  01/12/18

## 2018-01-19 ENCOUNTER — Telehealth (HOSPITAL_COMMUNITY): Payer: Self-pay

## 2018-01-19 NOTE — Telephone Encounter (Signed)
I called [name] [name] to see if I could come see her this afternoon or find out when I could see her next week. She did not answer so I left a voicemail requesting she call me back so we can get her scheduled.

## 2018-01-22 ENCOUNTER — Ambulatory Visit (HOSPITAL_COMMUNITY)
Admission: RE | Admit: 2018-01-22 | Discharge: 2018-01-22 | Disposition: A | Discharge status: Home / Self Care | Payer: PPO | Source: Ambulatory Visit | Attending: Cardiology | Admitting: Cardiology

## 2018-01-22 DIAGNOSIS — I5042 Chronic combined systolic (congestive) and diastolic (congestive) heart failure: Secondary | ICD-10-CM | POA: Insufficient documentation

## 2018-01-22 LAB — BASIC METABOLIC PANEL
Anion gap: 10 (ref 5–15)
BUN: 36 mg/dL — ABNORMAL HIGH (ref 6–20)
CO2: 22 mmol/L (ref 22–32)
Calcium: 9.3 mg/dL (ref 8.9–10.3)
Chloride: 107 mmol/L (ref 101–111)
Creatinine, Ser: 1.56 mg/dL — ABNORMAL HIGH (ref 0.44–1.00)
GFR calc Af Amer: 37 mL/min — ABNORMAL LOW (ref >60)
GFR calc non Af Amer: 32 mL/min — ABNORMAL LOW (ref >60)
Glucose, Bld: 79 mg/dL (ref 65–99)
Potassium: 5.5 mmol/L — ABNORMAL HIGH (ref 3.5–5.1)
Sodium: 139 mmol/L (ref 135–145)

## 2018-01-23 ENCOUNTER — Telehealth (HOSPITAL_COMMUNITY): Payer: Self-pay | Admitting: *Deleted

## 2018-01-23 ENCOUNTER — Other Ambulatory Visit (HOSPITAL_COMMUNITY): Payer: Self-pay

## 2018-01-23 NOTE — Progress Notes (Signed)
Paramedicine Encounter    Patient ID: [name] [name], female    DOB: June 28, 1946, 72 y.o.   MRN: [id number]   Patient Care Team: [name], MD as PCP - General (Internal Medicine) Debara Pickett Nadean Corwin, MD as PCP - Cardiology (Cardiology) Debara Pickett Nadean Corwin, MD as Consulting Physician (Cardiology)  Patient Active Problem List   Diagnosis Date Noted  . Postmenopausal vaginal bleeding 01/04/2018  . Snoring 01/04/2018  . Acute on chronic combined systolic and diastolic CHF (congestive heart failure) ([place])   . CHF exacerbation ([place]) 10/16/2017  . Noncompliance 10/16/2017  . Acute on chronic congestive heart failure ([place])   . Influenza with respiratory manifestation flu a + 09/14/16 started on tamiflu 09/15/17 09/19/2017  . Thrombocytopenia ([place]) 09/14/2017  . Acute drug-induced gout of right foot   . Medication noncompliance due to cognitive impairment 09/06/2017  . Acute diastolic CHF (congestive heart failure) ([place]) 08/06/2017  . LGI bleed, likely hemorrhoids 08/06/2017  . Atrial fibrillation ([place]) 08/04/2017  . Paroxysmal atrial fibrillation ([place]) 02/08/2017  . Cardiomyopathy, ischemic 02/08/2017  . Dysphagia 02/08/2017  . CKD (chronic kidney disease), stage III ([place]) 01/30/2017  . Pain in shoulder 02/08/2016  . Breast pain, left 02/08/2016  . Bilateral arm numbness and tingling while sleeping 02/08/2016  . Painful lumpy left breast 09/23/2015  . Candidal intertrigo 02/11/2015  . S/P CABG x 2 11/14/2014  . Accelerated hypertension   . Cardiac arrest ([place]) 11/04/2014  . Left main coronary artery disease 11/04/2014  . Coronary artery disease due to lipid rich plaque   . Acute respiratory failure with hypoxemia ([place])   . Essential hypertension   . Atrial fibrillation with RVR ([place])   . Hypokalemia 10/30/2014  . Chronic diastolic CHF (congestive heart failure) ([place]) 10/30/2014  . NSTEMI (non-ST elevated myocardial infarction) ([place])   . DOE (dyspnea on exertion)   . CAP (community  acquired pneumonia) 10/29/2014    Current Outpatient Medications:  .  acetaminophen (TYLENOL) 500 MG tablet, Take 500 mg by mouth every 6 (six) hours as needed for moderate pain. , Disp: , Rfl:  .  allopurinol (ZYLOPRIM) 300 MG tablet, Take 1 tablet (300 mg total) by mouth daily., Disp: 30 tablet, Rfl: 11 .  amiodarone (PACERONE) 200 MG tablet, Take 1 tablet (200 mg total) by mouth 2 (two) times daily., Disp: 60 tablet, Rfl: 11 .  atorvastatin (LIPITOR) 10 MG tablet, Take 10 mg by mouth daily., Disp: , Rfl:  .  furosemide (LASIX) 20 MG tablet, TAKE 3 TABLETS (60 MG TOTAL) BY MOUTH 2 (TWO) TIMES DAILY., Disp: 180 tablet, Rfl: 11 .  magnesium oxide (MAG-OX) 400 MG tablet, Take 1 tablet (400 mg total) by mouth 3 (three) times daily., Disp: 270 tablet, Rfl: 3 .  metoprolol succinate (TOPROL-XL) 50 MG 24 hr tablet, Take 1 tablet (50 mg total) by mouth daily. Take with or immediately following a meal., Disp: 30 tablet, Rfl: 3 .  ranitidine (ZANTAC) 150 MG capsule, Take 150 mg by mouth 2 (two) times daily as needed for heartburn. , Disp: , Rfl:  .  rivaroxaban (XARELTO) 20 MG TABS tablet, Take 1 tablet (20 mg total) by mouth daily with supper., Disp: 90 tablet, Rfl: 1 .  sacubitril-valsartan (ENTRESTO) 24-26 MG, Take 1 tablet by mouth 2 (two) times daily., Disp: 60 tablet, Rfl: 3 .  budesonide (PULMICORT) 0.5 MG/2ML nebulizer solution, Take 2 mLs (0.5 mg total) by nebulization 2 (two) times daily. (Patient not taking: Reported on 12/28/2017), Disp: , Rfl: 12 .  fluticasone (FLONASE) 50 MCG/ACT nasal spray, Place 1 spray into both nostrils daily. (Patient not taking: Reported on 01/04/2018), Disp: 16 g, Rfl: 2 .  levalbuterol (XOPENEX) 0.63 MG/3ML nebulizer solution, Take 3 mLs (0.63 mg total) by nebulization every 6 (six) hours as needed for wheezing or shortness of breath. (Patient not taking: Reported on 12/28/2017), Disp: 3 mL, Rfl: 12 Allergies  Allergen Reactions  . Bee Venom Anaphylaxis  . Codeine  Nausea And Vomiting  . Shrimp [Shellfish Allergy] Swelling  . Tomato     Pt states it gives her gout      Social History   Socioeconomic History  . Marital status: Widowed    Spouse name: Not on file  . Number of children: Not on file  . Years of education: Not on file  . Highest education level: Not on file  Occupational History  . Not on file  Social Needs  . Financial resource strain: Not on file  . Food insecurity:    Worry: Not on file    Inability: Not on file  . Transportation needs:    Medical: Not on file    Non-medical: Not on file  Tobacco Use  . Smoking status: Former Smoker    Packs/day: 0.10    Years: 56.00    Pack years: 5.60    Types: Cigarettes  . Smokeless tobacco: Never Used  Substance and Sexual Activity  . Alcohol use: No    Alcohol/week: 0.0 oz  . Drug use: No  . Sexual activity: Not on file  Lifestyle  . Physical activity:    Days per week: Not on file    Minutes per session: Not on file  . Stress: Not on file  Relationships  . Social connections:    Talks on phone: Not on file    Gets together: Not on file    Attends religious service: Not on file    Active member of club or organization: Not on file    Attends meetings of clubs or organizations: Not on file    Relationship status: Not on file  . Intimate partner violence:    Fear of current or ex partner: Not on file    Emotionally abused: Not on file    Physically abused: Not on file    Forced sexual activity: Not on file  Other Topics Concern  . Not on file  Social History Narrative  . Not on file    Physical Exam  Constitutional: She is oriented to person, place, and time.  Cardiovascular: Regular rhythm.  Pulmonary/Chest: Effort normal and breath sounds normal.  Abdominal: Soft.  Musculoskeletal: Normal range of motion. She exhibits edema.  Neurological: She is alert and oriented to person, place, and time.  Skin: Skin is warm and dry.  Psychiatric: She has a normal  mood and affect.        Future Appointments  Date Time Provider [place]  02/01/2018  1:30 PM [facility] PA/NP [facility] None    BP 118/78 (BP Location: Left Arm, Patient Position: Sitting, Cuff Size: Large)   Pulse (!) 48   Resp 16   Wt 197 lb 6.4 oz (89.5 kg)   SpO2 96%   BMI 29.58 kg/m   Weight yesterday- Did not weigh Last visit weight- 200.6 lb  Ms Dockery was seen at home today and reported feeling well. She stated she had ran out of medication in her pillbox over the weekend so she went to see her pharmacist who filled  the box for her. Upon me checking the medications, there were a two medications which were incorrect, though she had only recently been taken off of them. These medications were spironolactone and potassium. All other medications were verified and her pillbox was refilled accordingly. The clinic was contacted and advised they would fix her medication lest in [place]. She is scheduled for an appointment next week at the clinic and will have labs done at that time.   [name], EMT 01/23/18  ACTION: Home visit completed Next visit planned for 1 week

## 2018-01-23 NOTE — Telephone Encounter (Signed)
Result Notes for Basic metabolic panel   Notes recorded by Darron Doom, RN on 01/23/2018 at 3:29 PM EDT [name] called back and confirmed patient is also taking potassium 20 mEq daily. He has discontinued spiro and potassium and fixed her pill box. She has appointment scheduled for 6/20 and she wants to recheck labs at that appointment. Appointment notes and MAR updated. ------  Notes recorded by Darron Doom, RN on 01/23/2018 at 9:42 AM EDT Patient called back but she couldn't understand what medicine I was wanting her to stop. I called Zach with paramedicine and he is going to try and see patient today. He will call me once he is out there and will make her repeat lab appointment at that time. ------  Notes recorded by Kerry Dory, CMA on 01/23/2018 at 8:16 AM EDT Left message for patient to call back.  [phone] Jerilynn Mages) ------  Notes recorded by Georgiana Shore, NP on 01/22/2018 at 3:56 PM EDT DC spiro. Repeat BMET in 1 week. Please make sure she is avoiding high potassium foods and is not taking any potassium supplements.

## 2018-01-30 ENCOUNTER — Other Ambulatory Visit (HOSPITAL_COMMUNITY): Payer: Self-pay

## 2018-01-30 NOTE — Progress Notes (Signed)
Paramedicine Encounter    Patient ID: TENAE GRAZIOSI, female    DOB: June 28, 1946, 72 y.o.   MRN: [id number]   Patient Care Team: [name], MD as PCP - General (Internal Medicine) Debara Pickett Nadean Corwin, MD as PCP - Cardiology (Cardiology) Debara Pickett Nadean Corwin, MD as Consulting Physician (Cardiology)  Patient Active Problem List   Diagnosis Date Noted  . Postmenopausal vaginal bleeding 01/04/2018  . Snoring 01/04/2018  . Acute on chronic combined systolic and diastolic CHF (congestive heart failure) ([place])   . CHF exacerbation ([place]) 10/16/2017  . Noncompliance 10/16/2017  . Acute on chronic congestive heart failure ([place])   . Influenza with respiratory manifestation flu a + 09/14/16 started on tamiflu 09/15/17 09/19/2017  . Thrombocytopenia ([place]) 09/14/2017  . Acute drug-induced gout of right foot   . Medication noncompliance due to cognitive impairment 09/06/2017  . Acute diastolic CHF (congestive heart failure) ([place]) 08/06/2017  . LGI bleed, likely hemorrhoids 08/06/2017  . Atrial fibrillation ([place]) 08/04/2017  . Paroxysmal atrial fibrillation ([place]) 02/08/2017  . Cardiomyopathy, ischemic 02/08/2017  . Dysphagia 02/08/2017  . CKD (chronic kidney disease), stage III ([place]) 01/30/2017  . Pain in shoulder 02/08/2016  . Breast pain, left 02/08/2016  . Bilateral arm numbness and tingling while sleeping 02/08/2016  . Painful lumpy left breast 09/23/2015  . [place] intertrigo 02/11/2015  . S/P CABG x 2 11/14/2014  . Accelerated hypertension   . Cardiac arrest ([place]) 11/04/2014  . Left main coronary artery disease 11/04/2014  . Coronary artery disease due to lipid rich plaque   . Acute respiratory failure with hypoxemia ([place])   . Essential hypertension   . Atrial fibrillation with RVR ([place])   . Hypokalemia 10/30/2014  . Chronic diastolic CHF (congestive heart failure) ([place]) 10/30/2014  . NSTEMI (non-ST elevated myocardial infarction) ([place])   . DOE (dyspnea on exertion)   . CAP (community  acquired pneumonia) 10/29/2014    Current Outpatient Medications:  .  acetaminophen (TYLENOL) 500 MG tablet, Take 500 mg by mouth every 6 (six) hours as needed for moderate pain. , Disp: , Rfl:  .  allopurinol (ZYLOPRIM) 300 MG tablet, Take 1 tablet (300 mg total) by mouth daily., Disp: 30 tablet, Rfl: 11 .  amiodarone (PACERONE) 200 MG tablet, Take 1 tablet (200 mg total) by mouth 2 (two) times daily., Disp: 60 tablet, Rfl: 11 .  atorvastatin (LIPITOR) 10 MG tablet, Take 10 mg by mouth daily., Disp: , Rfl:  .  furosemide (LASIX) 20 MG tablet, TAKE 3 TABLETS (60 MG TOTAL) BY MOUTH 2 (TWO) TIMES DAILY., Disp: 180 tablet, Rfl: 11 .  magnesium oxide (MAG-OX) 400 MG tablet, Take 1 tablet (400 mg total) by mouth 3 (three) times daily., Disp: 270 tablet, Rfl: 3 .  metoprolol succinate (TOPROL-XL) 50 MG 24 hr tablet, Take 1 tablet (50 mg total) by mouth daily. Take with or immediately following a meal., Disp: 30 tablet, Rfl: 3 .  ranitidine (ZANTAC) 150 MG capsule, Take 150 mg by mouth 2 (two) times daily as needed for heartburn. , Disp: , Rfl:  .  rivaroxaban (XARELTO) 20 MG TABS tablet, Take 1 tablet (20 mg total) by mouth daily with supper., Disp: 90 tablet, Rfl: 1 .  sacubitril-valsartan (ENTRESTO) 24-26 MG, Take 1 tablet by mouth 2 (two) times daily., Disp: 60 tablet, Rfl: 3 .  budesonide (PULMICORT) 0.5 MG/2ML nebulizer solution, Take 2 mLs (0.5 mg total) by nebulization 2 (two) times daily. (Patient not taking: Reported on 12/28/2017), Disp: , Rfl: 12 .  fluticasone (FLONASE) 50 MCG/ACT nasal spray, Place 1 spray into both nostrils daily. (Patient not taking: Reported on 01/04/2018), Disp: 16 g, Rfl: 2 .  levalbuterol (XOPENEX) 0.63 MG/3ML nebulizer solution, Take 3 mLs (0.63 mg total) by nebulization every 6 (six) hours as needed for wheezing or shortness of breath. (Patient not taking: Reported on 12/28/2017), Disp: 3 mL, Rfl: 12 Allergies  Allergen Reactions  . Bee Venom Anaphylaxis  . Codeine  Nausea And Vomiting  . Shrimp [Shellfish Allergy] Swelling  . Tomato     Pt states it gives her gout      Social History   Socioeconomic History  . Marital status: Widowed    Spouse name: Not on file  . Number of children: Not on file  . Years of education: Not on file  . Highest education level: Not on file  Occupational History  . Not on file  Social Needs  . Financial resource strain: Not on file  . Food insecurity:    Worry: Not on file    Inability: Not on file  . Transportation needs:    Medical: Not on file    Non-medical: Not on file  Tobacco Use  . Smoking status: Former Smoker    Packs/day: 0.10    Years: 56.00    Pack years: 5.60    Types: Cigarettes  . Smokeless tobacco: Never Used  Substance and Sexual Activity  . Alcohol use: No    Alcohol/week: 0.0 oz  . Drug use: No  . Sexual activity: Not on file  Lifestyle  . Physical activity:    Days per week: Not on file    Minutes per session: Not on file  . Stress: Not on file  Relationships  . Social connections:    Talks on phone: Not on file    Gets together: Not on file    Attends religious service: Not on file    Active member of club or organization: Not on file    Attends meetings of clubs or organizations: Not on file    Relationship status: Not on file  . Intimate partner violence:    Fear of current or ex partner: Not on file    Emotionally abused: Not on file    Physically abused: Not on file    Forced sexual activity: Not on file  Other Topics Concern  . Not on file  Social History Narrative  . Not on file    Physical Exam  Constitutional: She is oriented to person, place, and time.  Cardiovascular: Regular rhythm.  Pulmonary/Chest: Effort normal and breath sounds normal.  Abdominal: Soft.  Musculoskeletal: Normal range of motion. She exhibits edema.  Neurological: She is alert and oriented to person, place, and time.  Skin: Skin is warm and dry.  Psychiatric: She has a normal  mood and affect.        Future Appointments  Date Time Provider [place]  02/01/2018  1:30 PM [facility] PA/NP [facility] None    BP (!) 166/76 (BP Location: Left Arm, Patient Position: Sitting, Cuff Size: Large)   Pulse (!) 50   Resp 16   Wt 196 lb 3.2 oz (89 kg)   SpO2 99%   BMI 29.40 kg/m   Weight yesterday- 194.6 lb Last visit weight- 197.4 lb  Ms Lochner was seen at home today and reported feeling well. She denied SOB, headache, dizziness or orthopnea. She had been compliant with her medications but is nearly out of samples for [facility] and  Xarelto. She cannot afford these medications which cost $126 and $109 respectively. I will bring this up to Lattie Haw so we can work on getting her onto the patient assistance programs. He medications were verified and her pillbox was refilled.   [name], EMT 01/31/18  ACTION: Home visit completed Next visit planned for 1 week

## 2018-02-01 ENCOUNTER — Encounter (HOSPITAL_COMMUNITY): Payer: Self-pay

## 2018-02-01 ENCOUNTER — Ambulatory Visit (HOSPITAL_COMMUNITY)
Admission: RE | Admit: 2018-02-01 | Discharge: 2018-02-01 | Disposition: A | Discharge status: Home / Self Care | Payer: PPO | Source: Ambulatory Visit | Attending: Cardiology | Admitting: Cardiology

## 2018-02-01 ENCOUNTER — Other Ambulatory Visit (HOSPITAL_COMMUNITY): Payer: Self-pay

## 2018-02-01 VITALS — BP 118/78 | HR 50 | Wt 197.0 lb

## 2018-02-01 DIAGNOSIS — I480 Paroxysmal atrial fibrillation: Secondary | ICD-10-CM

## 2018-02-01 DIAGNOSIS — Z7951 Long term (current) use of inhaled steroids: Secondary | ICD-10-CM | POA: Diagnosis not present

## 2018-02-01 DIAGNOSIS — I5042 Chronic combined systolic (congestive) and diastolic (congestive) heart failure: Secondary | ICD-10-CM

## 2018-02-01 DIAGNOSIS — E876 Hypokalemia: Secondary | ICD-10-CM | POA: Diagnosis not present

## 2018-02-01 DIAGNOSIS — I481 Persistent atrial fibrillation: Secondary | ICD-10-CM | POA: Insufficient documentation

## 2018-02-01 DIAGNOSIS — R001 Bradycardia, unspecified: Secondary | ICD-10-CM | POA: Insufficient documentation

## 2018-02-01 DIAGNOSIS — E785 Hyperlipidemia, unspecified: Secondary | ICD-10-CM | POA: Diagnosis not present

## 2018-02-01 DIAGNOSIS — Z79899 Other long term (current) drug therapy: Secondary | ICD-10-CM | POA: Diagnosis not present

## 2018-02-01 DIAGNOSIS — I5032 Chronic diastolic (congestive) heart failure: Secondary | ICD-10-CM

## 2018-02-01 DIAGNOSIS — Z951 Presence of aortocoronary bypass graft: Secondary | ICD-10-CM | POA: Diagnosis not present

## 2018-02-01 DIAGNOSIS — Z7901 Long term (current) use of anticoagulants: Secondary | ICD-10-CM | POA: Diagnosis not present

## 2018-02-01 DIAGNOSIS — I130 Hypertensive heart and chronic kidney disease with heart failure and stage 1 through stage 4 chronic kidney disease, or unspecified chronic kidney disease: Secondary | ICD-10-CM | POA: Diagnosis not present

## 2018-02-01 DIAGNOSIS — Z7952 Long term (current) use of systemic steroids: Secondary | ICD-10-CM | POA: Diagnosis not present

## 2018-02-01 DIAGNOSIS — I2510 Atherosclerotic heart disease of native coronary artery without angina pectoris: Secondary | ICD-10-CM | POA: Insufficient documentation

## 2018-02-01 DIAGNOSIS — J45909 Unspecified asthma, uncomplicated: Secondary | ICD-10-CM | POA: Insufficient documentation

## 2018-02-01 DIAGNOSIS — M109 Gout, unspecified: Secondary | ICD-10-CM | POA: Diagnosis not present

## 2018-02-01 DIAGNOSIS — Z87891 Personal history of nicotine dependence: Secondary | ICD-10-CM | POA: Insufficient documentation

## 2018-02-01 DIAGNOSIS — Z09 Encounter for follow-up examination after completed treatment for conditions other than malignant neoplasm: Secondary | ICD-10-CM | POA: Diagnosis present

## 2018-02-01 DIAGNOSIS — R9431 Abnormal electrocardiogram [ECG] [EKG]: Secondary | ICD-10-CM | POA: Insufficient documentation

## 2018-02-01 DIAGNOSIS — I10 Essential (primary) hypertension: Secondary | ICD-10-CM | POA: Diagnosis not present

## 2018-02-01 DIAGNOSIS — I255 Ischemic cardiomyopathy: Secondary | ICD-10-CM

## 2018-02-01 LAB — HEPATIC FUNCTION PANEL
ALT: 29 U/L (ref 14–54)
AST: 28 U/L (ref 15–41)
Albumin: 3 g/dL — ABNORMAL LOW (ref 3.5–5.0)
Alkaline Phosphatase: 155 U/L — ABNORMAL HIGH (ref 38–126)
Bilirubin, Direct: 0.1 mg/dL (ref 0.1–0.5)
Indirect Bilirubin: 0.3 mg/dL (ref 0.3–0.9)
Total Bilirubin: 0.4 mg/dL (ref 0.3–1.2)
Total Protein: 6.6 g/dL (ref 6.5–8.1)

## 2018-02-01 LAB — BASIC METABOLIC PANEL
Anion gap: 8 (ref 5–15)
BUN: 21 mg/dL — ABNORMAL HIGH (ref 6–20)
CO2: 23 mmol/L (ref 22–32)
Calcium: 9 mg/dL (ref 8.9–10.3)
Chloride: 111 mmol/L (ref 101–111)
Creatinine, Ser: 1.3 mg/dL — ABNORMAL HIGH (ref 0.44–1.00)
GFR calc Af Amer: 46 mL/min — ABNORMAL LOW (ref >60)
GFR calc non Af Amer: 40 mL/min — ABNORMAL LOW (ref >60)
Glucose, Bld: 126 mg/dL — ABNORMAL HIGH (ref 65–99)
Potassium: 3.6 mmol/L (ref 3.5–5.1)
Sodium: 142 mmol/L (ref 135–145)

## 2018-02-01 LAB — TSH: TSH: 0.735 u[IU]/mL (ref 0.350–4.500)

## 2018-02-01 NOTE — Patient Instructions (Addendum)
EKG today.  DECREASE Amiodarone 200 mg tablet ONCE daily.  Routine lab work today. Will notify you of abnormal results, otherwise no news is good news!  Follow up 4 weeks.  __________________________________________________________________ [name] [name] Code: 0233  Take all medication as prescribed the day of your appointment. Bring all medications with you to your appointment.  Do the following things EVERYDAY: 1) Weigh yourself in the morning before breakfast. Write it down and keep it in a log. 2) Take your medicines as prescribed 3) Eat low salt foods-Limit salt (sodium) to 2000 mg per day.  4) Stay as active as you can everyday 5) Limit all fluids for the day to less than 2 liters

## 2018-02-01 NOTE — Progress Notes (Signed)
Paramedicine Encounter   Patient ID: [name] [name] , female,   DOB: 06-05-1946,72 y.o.,  MRN: [id number]  Ms Petre was seen in the HF clinic today with [name], NP. She reported having some headaches since I last saw her and her HR was still slow. Per Amy, she is to decrease amiodarone to 200 mg daily. No other changes at this time. Her pillbox was adjusted to reflect this change.  [name], EMT 02/01/2018   ACTION: Home visit completed Next visit planned for 1 week

## 2018-02-01 NOTE — Progress Notes (Signed)
Advanced Heart Failure Clinic Consult Note   PCP: [name], MD PCP-Cardiologist: [name], MD  HF: Dr Haroldine Laws   HPI: [name] [name] is a 72 y.o. female with h/o HTN, Tobacco abuse, PAF, CAD s/p CABG x 2 (LIMA to LAD and SVG to OM) in 2016, Chronic combined CHF, Ischemic CMP, HLD, and DOE.   She was initially noted to be in atrial fibrillation in 2016 with a reduced LVEF, leading to ischemic workup with a cardiac cath showing severe multivessel CAD with LM involvement in which she underwent CABG x2 utilizing LIMA to LAD, SVG to OM.   Admitted 1/19 for AF with RVR in setting of Influenza A. During that hospitalization, she was started on diltiazem drip, EP was consulted to discuss plans for Tikosyn vs. ablation vs. medication rate control however, due to her known medication noncompliance she was found to not be an ablation nor antiarrhythmic candidate  Pt admitted 3/3 - 10/20/17 with recurrent Afib RVR and ADHF. Started on diltiazem. Echo repeated which showed fall in EF from previous. Diuresed with IV lasix with 5L negative for admission. Discharged home on diltiazem 360, Torpol 100, digoxin and Xarelto.  Seen by Dr. Debara Pickett 11/20/17, was feeling well overall. Of note, she reported not remembering her recent hospitalization. She was also noted to be out of her metoprolol for approximately a month.   Last visit, she was found to be in Afib. She was started on amiodarone and diltiazem was DCd. She was also started on Entresto. She underwent successful atrial flutter TEE/DCCV on 12/25/17. She was back in rate controlled afib on 5/16 during paramedic visit.  In May Toprol XL was cut back to 50 mg per day due to bradycardia.   Today she returns for  HF follow up. Spironolactone recently stopped due to hyperkalemia. Overall feeling fine. Denies SOB/PND/Orthopnea. No bleeding problems. Appetite ok. No fever or chills. Weight at home 192-196 pounds. Taking all medications and rarely misses  medication.   Echo in 3/16 EF 35-40%. Echo in 10/17 EF improved to 50-55%.  F/u echo 5/18 EF stable 50-55% Echo 10/19/17 LVEF 30-35%, Moderate LVH, Mild AI, Moderate regurgitation, Severe LAE, Moderately reduced RV, Severe RAE, PA peak pressure 50 mm Hg.   (Decreased from Echo 01/11/2017 with LVEF 50-55%)  Review of systems complete and found to be negative unless listed in HPI.   Past Medical History:  Diagnosis Date  . Acute respiratory failure ([place]) 08/2017  . Arthritis   . Asthma    ??  . CAD (coronary artery disease)    a. s/p CABG 2016.  . Cardiomyopathy, ischemic    a. EF previously low, improved to LVEF 50-55% as of May 2018  . Chronic combined systolic and diastolic heart failure ([place])   . CKD (chronic kidney disease), stage III ([place])   . Gout   . Hypertension   . Hypokalemia   . LGI bleed 08/06/2017   a. felt to be hemorrhoidal during that admission (no drop in Hgb).  . Persistent atrial fibrillation ([facility])    a. h/o difficult to control rates (complicated by noncompliance), not felt to be a candidate for ablation or antiarrhythmic due to noncompliance.  . Personal history of noncompliance with medical treatment, presenting hazards to health   . S/P CABG x 2 with clipping of LA appendage 11/14/2014   LIMA to LAD, SVG to OM, EVH via right thigh   Current Outpatient Medications  Medication Sig Dispense Refill  . acetaminophen (TYLENOL)  500 MG tablet Take 500 mg by mouth every 6 (six) hours as needed for moderate pain.     Marland Kitchen allopurinol (ZYLOPRIM) 300 MG tablet Take 1 tablet (300 mg total) by mouth daily. 30 tablet 11  . amiodarone (PACERONE) 200 MG tablet Take 1 tablet (200 mg total) by mouth 2 (two) times daily. 60 tablet 11  . atorvastatin (LIPITOR) 10 MG tablet Take 10 mg by mouth daily.    . budesonide (PULMICORT) 0.5 MG/2ML nebulizer solution Take 2 mLs (0.5 mg total) by nebulization 2 (two) times daily.  12  . fluticasone (FLONASE) 50 MCG/ACT nasal spray Place 1 spray  into both nostrils daily. 16 g 2  . furosemide (LASIX) 20 MG tablet TAKE 3 TABLETS (60 MG TOTAL) BY MOUTH 2 (TWO) TIMES DAILY. 180 tablet 11  . levalbuterol (XOPENEX) 0.63 MG/3ML nebulizer solution Take 3 mLs (0.63 mg total) by nebulization every 6 (six) hours as needed for wheezing or shortness of breath. 3 mL 12  . magnesium oxide (MAG-OX) 400 MG tablet Take 1 tablet (400 mg total) by mouth 3 (three) times daily. 270 tablet 3  . metoprolol succinate (TOPROL-XL) 50 MG 24 hr tablet Take 1 tablet (50 mg total) by mouth daily. Take with or immediately following a meal. 30 tablet 3  . ranitidine (ZANTAC) 150 MG capsule Take 150 mg by mouth 2 (two) times daily as needed for heartburn.     . rivaroxaban (XARELTO) 20 MG TABS tablet Take 1 tablet (20 mg total) by mouth daily with supper. 90 tablet 1  . sacubitril-valsartan (ENTRESTO) 24-26 MG Take 1 tablet by mouth 2 (two) times daily. 60 tablet 3   No current facility-administered medications for this encounter.    Allergies  Allergen Reactions  . Bee Venom Anaphylaxis  . Codeine Nausea And Vomiting  . Shrimp [Shellfish Allergy] Swelling  . Tomato     Pt states it gives her gout   Social History   Socioeconomic History  . Marital status: Widowed    Spouse name: Not on file  . Number of children: Not on file  . Years of education: Not on file  . Highest education level: Not on file  Occupational History  . Not on file  Social Needs  . Financial resource strain: Not on file  . Food insecurity:    Worry: Not on file    Inability: Not on file  . Transportation needs:    Medical: Not on file    Non-medical: Not on file  Tobacco Use  . Smoking status: Former Smoker    Packs/day: 0.10    Years: 56.00    Pack years: 5.60    Types: Cigarettes  . Smokeless tobacco: Never Used  Substance and Sexual Activity  . Alcohol use: No    Alcohol/week: 0.0 oz  . Drug use: No  . Sexual activity: Not on file  Lifestyle  . Physical activity:     Days per week: Not on file    Minutes per session: Not on file  . Stress: Not on file  Relationships  . Social connections:    Talks on phone: Not on file    Gets together: Not on file    Attends religious service: Not on file    Active member of club or organization: Not on file    Attends meetings of clubs or organizations: Not on file    Relationship status: Not on file  . Intimate partner violence:    Fear of  current or ex partner: Not on file    Emotionally abused: Not on file    Physically abused: Not on file    Forced sexual activity: Not on file  Other Topics Concern  . Not on file  Social History Narrative  . Not on file   Family History  Problem Relation Age of Onset  . Cancer Mother   . Heart disease Father    Vitals:   02/01/18 1342  BP: 118/78  Pulse: (!) 50  SpO2: 98%  Weight: 197 lb (89.4 kg)   Wt Readings from Last 3 Encounters:  02/01/18 197 lb (89.4 kg)  01/30/18 196 lb 3.2 oz (89 kg)  01/23/18 197 lb 6.4 oz (89.5 kg)    PHYSICAL EXAM: General:  Well appearing. No resp difficulty HEENT: normal Neck: supple. no JVD. Carotids 2+ bilat; no bruits. No lymphadenopathy or thryomegaly appreciated. Cor: PMI nondisplaced. Regular rate & rhythm. No rubs, gallops or murmurs. Lungs: clear Abdomen: soft, nontender, nondistended. No hepatosplenomegaly. No bruits or masses. Good bowel sounds. Extremities: no cyanosis, clubbing, rash, edema Neuro: alert & orientedx3, cranial nerves grossly intact. moves all 4 extremities w/o difficulty. Affect pleasant   ECG: Sinus Brady 46 bpm   ASSESSMENT & PLAN:  1. Chronic combined CHF - Suspect etiology is combined Ischemic/NICM with Afib/RVR for nearly a year.   - Echo 10/19/17 LVEF 30-35%, Moderate LVH, Mild AI, Moderate regurgitation, Severe LAE, Moderately reduced RV, Severe RAE, PA peak pressure 50 mm Hg.   (Decreased from Echo 01/11/2017 with LVEF 50-55%) - NYHA II. Volume status stable.  Continue lasix 60 mg BID.  -  Continue Toprol XL 50 mg daily. Unable to increase with bradycardia  - Contnue digoxin 0.125 mg daily. Dig level 0.5 10/2017 - Continue Entresto 24/26 mg BID.  - Off spiro and potassium with hypokalemia. -Check BMET   2. CAD s/p CABG x 2 2016 (LIMA to LAD and SVG to OM) - No s/s ischemia.  - Continue statin. Stop ASA (on xarelto). - Switch uloric to allopurinol 300 mg daily. Recent study published (CARES trial) that showed an increased risk of death with uloric in patients with CAD.   3. PAF s/p TEE/DCCV on 12/25/17 - CHA2DS2-VASc is at least 5.  -EKG Sinus Bradycardia 46 bpm  - Continue xarelto.  -Maintaining NSR.  - Decrease amiodarone 200 mg daily    4. HTN Stable.    5. Tobacco Abuse - Stopped smoking 10/2017. Continued cessation.   6. HLD - Per [facility]. No change.   7. Suspected OSA/Snoring - She had a sleep study last month and was told she needs to come back for another one. Unable to locate study in epic. She cannot afford to pay for another sleep study.  8. Social  - She has poor insight into her disease.  - Now followed by HF paramedicine - Followed by HF Paramedicine.   Check BMET  Follow up in 4 weeks.  Greater than 50% of the 25 minute visit was spent in counseling/coordination of care regarding disease state education, salt/fluid restriction, sliding scale diuretics, and medication compliance.  [name], NP 02/01/18

## 2018-02-06 ENCOUNTER — Other Ambulatory Visit (HOSPITAL_COMMUNITY): Payer: Self-pay | Admitting: *Deleted

## 2018-02-06 ENCOUNTER — Other Ambulatory Visit (HOSPITAL_COMMUNITY): Payer: Self-pay

## 2018-02-06 MED ORDER — AMIODARONE HCL 200 MG PO TABS: 200.0000 mg | ORAL_TABLET | Freq: Every day | ORAL | 11 refills | Status: DC

## 2018-02-06 NOTE — Progress Notes (Signed)
Paramedicine Encounter    Patient ID: [name] [name], female    DOB: 11-12-1945, 72 y.o.   MRN: [id number]   Patient Care Team: [name], MD as PCP - General (Internal Medicine) Debara Pickett Nadean Corwin, MD as PCP - Cardiology (Cardiology) Debara Pickett Nadean Corwin, MD as Consulting Physician (Cardiology)  Patient Active Problem List   Diagnosis Date Noted  . Postmenopausal vaginal bleeding 01/04/2018  . Snoring 01/04/2018  . Acute on chronic combined systolic and diastolic CHF (congestive heart failure) ([place])   . CHF exacerbation ([place]) 10/16/2017  . Noncompliance 10/16/2017  . Acute on chronic congestive heart failure ([place])   . Influenza with respiratory manifestation flu a + 09/14/16 started on tamiflu 09/15/17 09/19/2017  . Thrombocytopenia ([place]) 09/14/2017  . Acute drug-induced gout of right foot   . Medication noncompliance due to cognitive impairment 09/06/2017  . Acute diastolic CHF (congestive heart failure) ([place]) 08/06/2017  . LGI bleed, likely hemorrhoids 08/06/2017  . Atrial fibrillation ([place]) 08/04/2017  . Paroxysmal atrial fibrillation ([place]) 02/08/2017  . Cardiomyopathy, ischemic 02/08/2017  . Dysphagia 02/08/2017  . CKD (chronic kidney disease), stage III ([place]) 01/30/2017  . Pain in shoulder 02/08/2016  . Breast pain, left 02/08/2016  . Bilateral arm numbness and tingling while sleeping 02/08/2016  . Painful lumpy left breast 09/23/2015  . Candidal intertrigo 02/11/2015  . S/P CABG x 2 11/14/2014  . Accelerated hypertension   . Cardiac arrest ([place]) 11/04/2014  . Left main coronary artery disease 11/04/2014  . Coronary artery disease due to lipid rich plaque   . Acute respiratory failure with hypoxemia ([place])   . Essential hypertension   . Atrial fibrillation with RVR ([place])   . Hypokalemia 10/30/2014  . Chronic diastolic CHF (congestive heart failure) ([place]) 10/30/2014  . NSTEMI (non-ST elevated myocardial infarction) ([place])   . [name] (dyspnea on exertion)   . CAP (community  acquired pneumonia) 10/29/2014    Current Outpatient Medications:  .  acetaminophen (TYLENOL) 500 MG tablet, Take 500 mg by mouth every 6 (six) hours as needed for moderate pain. , Disp: , Rfl:  .  allopurinol (ZYLOPRIM) 300 MG tablet, Take 1 tablet (300 mg total) by mouth daily., Disp: 30 tablet, Rfl: 11 .  amiodarone (PACERONE) 200 MG tablet, Take 1 tablet (200 mg total) by mouth 2 (two) times daily., Disp: 60 tablet, Rfl: 11 .  atorvastatin (LIPITOR) 10 MG tablet, Take 10 mg by mouth daily., Disp: , Rfl:  .  furosemide (LASIX) 20 MG tablet, TAKE 3 TABLETS (60 MG TOTAL) BY MOUTH 2 (TWO) TIMES DAILY., Disp: 180 tablet, Rfl: 11 .  magnesium oxide (MAG-OX) 400 MG tablet, Take 1 tablet (400 mg total) by mouth 3 (three) times daily., Disp: 270 tablet, Rfl: 3 .  metoprolol succinate (TOPROL-XL) 50 MG 24 hr tablet, Take 1 tablet (50 mg total) by mouth daily. Take with or immediately following a meal., Disp: 30 tablet, Rfl: 3 .  ranitidine (ZANTAC) 150 MG capsule, Take 150 mg by mouth 2 (two) times daily as needed for heartburn. , Disp: , Rfl:  .  rivaroxaban (XARELTO) 20 MG TABS tablet, Take 1 tablet (20 mg total) by mouth daily with supper., Disp: 90 tablet, Rfl: 1 .  sacubitril-valsartan (ENTRESTO) 24-26 MG, Take 1 tablet by mouth 2 (two) times daily., Disp: 60 tablet, Rfl: 3 .  budesonide (PULMICORT) 0.5 MG/2ML nebulizer solution, Take 2 mLs (0.5 mg total) by nebulization 2 (two) times daily. (Patient not taking: Reported on 02/06/2018), Disp: , Rfl: 12 .  fluticasone (FLONASE) 50 MCG/ACT nasal spray, Place 1 spray into both nostrils daily. (Patient not taking: Reported on 02/06/2018), Disp: 16 g, Rfl: 2 .  levalbuterol (XOPENEX) 0.63 MG/3ML nebulizer solution, Take 3 mLs (0.63 mg total) by nebulization every 6 (six) hours as needed for wheezing or shortness of breath. (Patient not taking: Reported on 02/06/2018), Disp: 3 mL, Rfl: 12 Allergies  Allergen Reactions  . Bee Venom Anaphylaxis  . Codeine  Nausea And Vomiting  . Shrimp [Shellfish Allergy] Swelling  . Tomato     Pt states it gives her gout      Social History   Socioeconomic History  . Marital status: Widowed    Spouse name: Not on file  . Number of children: Not on file  . Years of education: Not on file  . Highest education level: Not on file  Occupational History  . Not on file  Social Needs  . Financial resource strain: Not on file  . Food insecurity:    Worry: Not on file    Inability: Not on file  . Transportation needs:    Medical: Not on file    Non-medical: Not on file  Tobacco Use  . Smoking status: Former Smoker    Packs/day: 0.10    Years: 56.00    Pack years: 5.60    Types: Cigarettes  . Smokeless tobacco: Never Used  Substance and Sexual Activity  . Alcohol use: No    Alcohol/week: 0.0 oz  . Drug use: No  . Sexual activity: Not on file  Lifestyle  . Physical activity:    Days per week: Not on file    Minutes per session: Not on file  . Stress: Not on file  Relationships  . Social connections:    Talks on phone: Not on file    Gets together: Not on file    Attends religious service: Not on file    Active member of club or organization: Not on file    Attends meetings of clubs or organizations: Not on file    Relationship status: Not on file  . Intimate partner violence:    Fear of current or ex partner: Not on file    Emotionally abused: Not on file    Physically abused: Not on file    Forced sexual activity: Not on file  Other Topics Concern  . Not on file  Social History Narrative  . Not on file    Physical Exam  Constitutional: She is oriented to person, place, and time.  Cardiovascular: Regular rhythm. Bradycardia present.  Pulmonary/Chest: Effort normal and breath sounds normal.  Abdominal: Soft.  Musculoskeletal: Normal range of motion. She exhibits edema.  Neurological: She is alert and oriented to person, place, and time.  Skin: Skin is warm and dry.   Psychiatric: She has a normal mood and affect.        Future Appointments  Date Time Provider [place]  03/06/2018  2:00 PM [facility] PA/NP [facility] None    BP (!) 160/76 (BP Location: Left Arm, Patient Position: Sitting, Cuff Size: Large)   Pulse (!) 50   Resp 18   Wt 197 lb 12.8 oz (89.7 kg)   SpO2 96%   BMI 29.64 kg/m   Weight yesterday- 196.3 lb Last visit weight- 196.2 lb   [name] [name] was seen at home today and reported feeling well. She denied SOB, headache, dizziness or orthopnea. She has been compliant with her medications, which were verified and her pillbox  was refilled. Her medication list still showed her to be taking 200 mg of amiodarone BID however last week Amy Clegg NP, changed this to 200 mg daily. I contacted the clinic and they made the necessary changes. Necessary medications were called in for refill and [name] [name]. [name] [name] was given a yellow paramedicine folder today with educational materials and a weight chart.   [name] [name], EMT 02/06/18  ACTION: Home visit completed Next visit planned for 1 week

## 2018-02-08 ENCOUNTER — Telehealth (HOSPITAL_COMMUNITY): Payer: Self-pay

## 2018-02-08 NOTE — Telephone Encounter (Signed)
Patient was approved by the [facility] for Entresto effective 11/08/2017 through 02/06/2019. Amount available is $1000 for Entresto.

## 2018-02-09 DIAGNOSIS — I5020 Unspecified systolic (congestive) heart failure: Secondary | ICD-10-CM | POA: Diagnosis not present

## 2018-02-09 DIAGNOSIS — I10 Essential (primary) hypertension: Secondary | ICD-10-CM | POA: Diagnosis not present

## 2018-02-09 DIAGNOSIS — I25810 Atherosclerosis of coronary artery bypass graft(s) without angina pectoris: Secondary | ICD-10-CM | POA: Diagnosis not present

## 2018-02-09 DIAGNOSIS — I4891 Unspecified atrial fibrillation: Secondary | ICD-10-CM | POA: Diagnosis not present

## 2018-02-09 DIAGNOSIS — J449 Chronic obstructive pulmonary disease, unspecified: Secondary | ICD-10-CM | POA: Diagnosis not present

## 2018-02-09 DIAGNOSIS — N183 Chronic kidney disease, stage 3 (moderate): Secondary | ICD-10-CM | POA: Diagnosis not present

## 2018-02-09 DIAGNOSIS — M1990 Unspecified osteoarthritis, unspecified site: Secondary | ICD-10-CM | POA: Diagnosis not present

## 2018-02-09 DIAGNOSIS — I214 Non-ST elevation (NSTEMI) myocardial infarction: Secondary | ICD-10-CM | POA: Diagnosis not present

## 2018-02-12 ENCOUNTER — Other Ambulatory Visit (HOSPITAL_COMMUNITY): Payer: Self-pay

## 2018-02-12 ENCOUNTER — Telehealth (HOSPITAL_COMMUNITY): Payer: Self-pay

## 2018-02-12 NOTE — Progress Notes (Signed)
Paramedicine Encounter    Patient ID: [name] [name], female    DOB: 1945/08/16, 72 y.o.   MRN: [id number]   Patient Care Team: [name], MD as PCP - General (Internal Medicine) Debara Pickett Nadean Corwin, MD as PCP - Cardiology (Cardiology) Debara Pickett Nadean Corwin, MD as Consulting Physician (Cardiology)  Patient Active Problem List   Diagnosis Date Noted  . Postmenopausal vaginal bleeding 01/04/2018  . Snoring 01/04/2018  . Acute on chronic combined systolic and diastolic CHF (congestive heart failure) ([place])   . CHF exacerbation ([employer]) 10/16/2017  . Noncompliance 10/16/2017  . Acute on chronic congestive heart failure ([place])   . Influenza with respiratory manifestation flu a + 09/14/16 started on tamiflu 09/15/17 09/19/2017  . Thrombocytopenia ([place]) 09/14/2017  . Acute drug-induced gout of right foot   . Medication noncompliance due to cognitive impairment 09/06/2017  . Acute diastolic CHF (congestive heart failure) ([place]) 08/06/2017  . LGI bleed, likely hemorrhoids 08/06/2017  . Atrial fibrillation ([place]) 08/04/2017  . Paroxysmal atrial fibrillation ([place]) 02/08/2017  . Cardiomyopathy, ischemic 02/08/2017  . Dysphagia 02/08/2017  . CKD (chronic kidney disease), stage III ([place]) 01/30/2017  . Pain in shoulder 02/08/2016  . Breast pain, left 02/08/2016  . Bilateral arm numbness and tingling while sleeping 02/08/2016  . Painful lumpy left breast 09/23/2015  . Candidal intertrigo 02/11/2015  . S/P CABG x 2 11/14/2014  . Accelerated hypertension   . Cardiac arrest ([place]) 11/04/2014  . Left main coronary artery disease 11/04/2014  . Coronary artery disease due to lipid rich plaque   . Acute respiratory failure with hypoxemia ([place])   . Essential hypertension   . Atrial fibrillation with RVR ([place])   . Hypokalemia 10/30/2014  . Chronic diastolic CHF (congestive heart failure) ([place]) 10/30/2014  . NSTEMI (non-ST elevated myocardial infarction) ([place])   . DOE (dyspnea on exertion)   . CAP (community  acquired pneumonia) 10/29/2014    Current Outpatient Medications:  .  acetaminophen (TYLENOL) 500 MG tablet, Take 500 mg by mouth every 6 (six) hours as needed for moderate pain. , Disp: , Rfl:  .  allopurinol (ZYLOPRIM) 300 MG tablet, Take 1 tablet (300 mg total) by mouth daily., Disp: 30 tablet, Rfl: 11 .  amiodarone (PACERONE) 200 MG tablet, Take 1 tablet (200 mg total) by mouth daily., Disp: 30 tablet, Rfl: 11 .  atorvastatin (LIPITOR) 10 MG tablet, Take 10 mg by mouth daily., Disp: , Rfl:  .  furosemide (LASIX) 20 MG tablet, TAKE 3 TABLETS (60 MG TOTAL) BY MOUTH 2 (TWO) TIMES DAILY., Disp: 180 tablet, Rfl: 11 .  magnesium oxide (MAG-OX) 400 MG tablet, Take 1 tablet (400 mg total) by mouth 3 (three) times daily., Disp: 270 tablet, Rfl: 3 .  metoprolol succinate (TOPROL-XL) 50 MG 24 hr tablet, Take 1 tablet (50 mg total) by mouth daily. Take with or immediately following a meal., Disp: 30 tablet, Rfl: 3 .  ranitidine (ZANTAC) 150 MG capsule, Take 150 mg by mouth 2 (two) times daily as needed for heartburn. , Disp: , Rfl:  .  rivaroxaban (XARELTO) 20 MG TABS tablet, Take 1 tablet (20 mg total) by mouth daily with supper., Disp: 90 tablet, Rfl: 1 .  sacubitril-valsartan (ENTRESTO) 24-26 MG, Take 1 tablet by mouth 2 (two) times daily., Disp: 60 tablet, Rfl: 3 .  budesonide (PULMICORT) 0.5 MG/2ML nebulizer solution, Take 2 mLs (0.5 mg total) by nebulization 2 (two) times daily. (Patient not taking: Reported on 02/06/2018), Disp: , Rfl: 12 .  fluticasone (  FLONASE) 50 MCG/ACT nasal spray, Place 1 spray into both nostrils daily. (Patient not taking: Reported on 02/06/2018), Disp: 16 g, Rfl: 2 .  levalbuterol (XOPENEX) 0.63 MG/3ML nebulizer solution, Take 3 mLs (0.63 mg total) by nebulization every 6 (six) hours as needed for wheezing or shortness of breath. (Patient not taking: Reported on 02/06/2018), Disp: 3 mL, Rfl: 12 Allergies  Allergen Reactions  . Bee Venom Anaphylaxis  . Codeine Nausea And  Vomiting  . Shrimp [Shellfish Allergy] Swelling  . Tomato     Pt states it gives her gout      Social History   Socioeconomic History  . Marital status: Widowed    Spouse name: Not on file  . Number of children: Not on file  . Years of education: Not on file  . Highest education level: Not on file  Occupational History  . Not on file  Social Needs  . Financial resource strain: Not on file  . Food insecurity:    Worry: Not on file    Inability: Not on file  . Transportation needs:    Medical: Not on file    Non-medical: Not on file  Tobacco Use  . Smoking status: Former Smoker    Packs/day: 0.10    Years: 56.00    Pack years: 5.60    Types: Cigarettes  . Smokeless tobacco: Never Used  Substance and Sexual Activity  . Alcohol use: No    Alcohol/week: 0.0 oz  . Drug use: No  . Sexual activity: Not on file  Lifestyle  . Physical activity:    Days per week: Not on file    Minutes per session: Not on file  . Stress: Not on file  Relationships  . Social connections:    Talks on phone: Not on file    Gets together: Not on file    Attends religious service: Not on file    Active member of club or organization: Not on file    Attends meetings of clubs or organizations: Not on file    Relationship status: Not on file  . Intimate partner violence:    Fear of current or ex partner: Not on file    Emotionally abused: Not on file    Physically abused: Not on file    Forced sexual activity: Not on file  Other Topics Concern  . Not on file  Social History Narrative  . Not on file    Physical Exam  Constitutional: She is oriented to person, place, and time.  Cardiovascular: Regular rhythm. Bradycardia present.  Pulmonary/Chest: Effort normal and breath sounds normal.  Abdominal: She exhibits distension.  Musculoskeletal: Normal range of motion. She exhibits edema.  Neurological: She is alert and oriented to person, place, and time.  Skin: Skin is warm and dry.   Psychiatric: She has a normal mood and affect.        Future Appointments  Date Time Provider [place]  03/06/2018  2:00 PM [facility] PA/NP [facility] None    BP (!) 158/72 (BP Location: Left Arm, Patient Position: Sitting, Cuff Size: Large)   Pulse (!) 46   Resp 16   Wt 197 lb (89.4 kg)   SpO2 97%   BMI 29.52 kg/m   Weight yesterday- Did not weigh  Last visit weight- 197 lb  Ms Bury was seen at home today and reported feeling unwell. Specifically, she complained of feeling bloated and having epigastric discomforts. This has been ongoing since last week and she says  she gets some relief with a bowel movement but it is minimal. I suggested she try OTC stool softeners and she was agreeable to this. She remains bradycardic despite the decrease in amiodarone. She reported being compliant with her medications with the exception of one day last week when she went out of town for a birthday party. She was also found to be hypertensive a exhibiting BLEE. By the end of assessment, the time was past 17:00 so I will contact the clinic tomorrow and see if any further adjustments are necessary.   [name], EMT 02/12/18  ACTION: Home visit completed Next visit planned for 1 week

## 2018-02-12 NOTE — Telephone Encounter (Signed)
I called [name] [name] to schedule an appointment. She stated she was not up yet and asked if I could come this afternoon. I offered 13:00 and she countered with 16:00 so we agreed to meet at 16:00.

## 2018-02-13 ENCOUNTER — Other Ambulatory Visit (HOSPITAL_COMMUNITY): Payer: Self-pay

## 2018-02-13 ENCOUNTER — Telehealth (HOSPITAL_COMMUNITY): Payer: Self-pay | Admitting: *Deleted

## 2018-02-13 NOTE — Progress Notes (Signed)
I went to see [name] [name] today after speaking with clinic staff regarding her complaints from yesterday as well as the bradycardia. Per [name], NP-C, I removed amiodarone and metoprolol from her pillbox. I will return early next week and obtain and ECG at their request as well.

## 2018-02-13 NOTE — Telephone Encounter (Signed)
[name] saw patient and ran an ekg pt was NSR but bradycardic rate of 46 with low energy. Per Amy stop amiodarone stop metoprolol. Edwyna Ready will see pt next week and get an ekg. PT has follow up at the end of the month.

## 2018-02-16 ENCOUNTER — Telehealth (HOSPITAL_COMMUNITY): Payer: Self-pay

## 2018-02-16 ENCOUNTER — Other Ambulatory Visit (HOSPITAL_COMMUNITY): Payer: Self-pay

## 2018-02-16 NOTE — Progress Notes (Signed)
Paramedicine Encounter    Patient ID: [name] [name], female    DOB: 03-28-1946, 72 y.o.   MRN: [id number]   Patient Care Team: [name], MD as PCP - General (Internal Medicine) Debara Pickett Nadean Corwin, MD as PCP - Cardiology (Cardiology) Debara Pickett Nadean Corwin, MD as Consulting Physician (Cardiology)  Patient Active Problem List   Diagnosis Date Noted  . Postmenopausal vaginal bleeding 01/04/2018  . Snoring 01/04/2018  . Acute on chronic combined systolic and diastolic CHF (congestive heart failure) ([place])   . CHF exacerbation ([place]) 10/16/2017  . Noncompliance 10/16/2017  . Acute on chronic congestive heart failure ([place])   . Influenza with respiratory manifestation flu a + 09/14/16 started on tamiflu 09/15/17 09/19/2017  . Thrombocytopenia ([place]) 09/14/2017  . Acute drug-induced gout of right foot   . Medication noncompliance due to cognitive impairment 09/06/2017  . Acute diastolic CHF (congestive heart failure) ([place]) 08/06/2017  . LGI bleed, likely hemorrhoids 08/06/2017  . Atrial fibrillation ([place]) 08/04/2017  . Paroxysmal atrial fibrillation ([place]) 02/08/2017  . Cardiomyopathy, ischemic 02/08/2017  . Dysphagia 02/08/2017  . CKD (chronic kidney disease), stage III ([place]) 01/30/2017  . Pain in shoulder 02/08/2016  . Breast pain, left 02/08/2016  . Bilateral arm numbness and tingling while sleeping 02/08/2016  . Painful lumpy left breast 09/23/2015  . Candidal intertrigo 02/11/2015  . S/P CABG x 2 11/14/2014  . Accelerated hypertension   . Cardiac arrest ([place]) 11/04/2014  . Left main coronary artery disease 11/04/2014  . Coronary artery disease due to lipid rich plaque   . Acute respiratory failure with hypoxemia ([place])   . Essential hypertension   . Atrial fibrillation with RVR ([place])   . Hypokalemia 10/30/2014  . Chronic diastolic CHF (congestive heart failure) ([place]) 10/30/2014  . NSTEMI (non-ST elevated myocardial infarction) ([place])   . DOE (dyspnea on exertion)   . CAP (community  acquired pneumonia) 10/29/2014    Current Outpatient Medications:  .  acetaminophen (TYLENOL) 500 MG tablet, Take 500 mg by mouth every 6 (six) hours as needed for moderate pain. , Disp: , Rfl:  .  allopurinol (ZYLOPRIM) 300 MG tablet, Take 1 tablet (300 mg total) by mouth daily., Disp: 30 tablet, Rfl: 11 .  atorvastatin (LIPITOR) 10 MG tablet, Take 10 mg by mouth daily., Disp: , Rfl:  .  furosemide (LASIX) 20 MG tablet, TAKE 3 TABLETS (60 MG TOTAL) BY MOUTH 2 (TWO) TIMES DAILY., Disp: 180 tablet, Rfl: 11 .  magnesium oxide (MAG-OX) 400 MG tablet, Take 1 tablet (400 mg total) by mouth 3 (three) times daily., Disp: 270 tablet, Rfl: 3 .  rivaroxaban (XARELTO) 20 MG TABS tablet, Take 1 tablet (20 mg total) by mouth daily with supper., Disp: 90 tablet, Rfl: 1 .  sacubitril-valsartan (ENTRESTO) 24-26 MG, Take 1 tablet by mouth 2 (two) times daily., Disp: 60 tablet, Rfl: 3 .  budesonide (PULMICORT) 0.5 MG/2ML nebulizer solution, Take 2 mLs (0.5 mg total) by nebulization 2 (two) times daily. (Patient not taking: Reported on 02/06/2018), Disp: , Rfl: 12 .  fluticasone (FLONASE) 50 MCG/ACT nasal spray, Place 1 spray into both nostrils daily. (Patient not taking: Reported on 02/06/2018), Disp: 16 g, Rfl: 2 .  levalbuterol (XOPENEX) 0.63 MG/3ML nebulizer solution, Take 3 mLs (0.63 mg total) by nebulization every 6 (six) hours as needed for wheezing or shortness of breath. (Patient not taking: Reported on 02/06/2018), Disp: 3 mL, Rfl: 12 .  ranitidine (ZANTAC) 150 MG capsule, Take 150 mg by mouth 2 (two) times  daily as needed for heartburn. , Disp: , Rfl:  Allergies  Allergen Reactions  . Bee Venom Anaphylaxis  . Codeine Nausea And Vomiting  . Shrimp [Shellfish Allergy] Swelling  . Tomato     Pt states it gives her gout      Social History   Socioeconomic History  . Marital status: Widowed    Spouse name: Not on file  . Number of children: Not on file  . Years of education: Not on file  . Highest  education level: Not on file  Occupational History  . Not on file  Social Needs  . Financial resource strain: Not on file  . Food insecurity:    Worry: Not on file    Inability: Not on file  . Transportation needs:    Medical: Not on file    Non-medical: Not on file  Tobacco Use  . Smoking status: Former Smoker    Packs/day: 0.10    Years: 56.00    Pack years: 5.60    Types: Cigarettes  . Smokeless tobacco: Never Used  Substance and Sexual Activity  . Alcohol use: No    Alcohol/week: 0.0 oz  . Drug use: No  . Sexual activity: Not on file  Lifestyle  . Physical activity:    Days per week: Not on file    Minutes per session: Not on file  . Stress: Not on file  Relationships  . Social connections:    Talks on phone: Not on file    Gets together: Not on file    Attends religious service: Not on file    Active member of club or organization: Not on file    Attends meetings of clubs or organizations: Not on file    Relationship status: Not on file  . Intimate partner violence:    Fear of current or ex partner: Not on file    Emotionally abused: Not on file    Physically abused: Not on file    Forced sexual activity: Not on file  Other Topics Concern  . Not on file  Social History Narrative  . Not on file    Physical Exam  Cardiovascular: Normal rate and regular rhythm.  Pulmonary/Chest: Effort normal and breath sounds normal.  Abdominal: Soft.  Musculoskeletal: Normal range of motion. She exhibits no edema.  Neurological: She is alert.  Skin: Skin is warm and dry.  Psychiatric: She has a normal mood and affect.        Future Appointments  Date Time Provider [place]  03/06/2018  2:00 PM [facility] PA/NP [facility] None    BP (!) 154/82 (BP Location: Left Arm, Patient Position: Sitting, Cuff Size: Large)   Resp 16   SpO2 97%   [name] [name] was seen at home today and reported feeling better. She called this morning to report an episode of chest discomfort  and a headache last night. Today she stated the pain was gone but she felt like she lept on her neck wrong. She was concerned that her blood pressure has gone up after we stopped her metoprolol on Tuesday. She was found to be slightly hypertensive but she had not taken any morning medications at the time of my visit because she was afraid the medication change caused her problems last night. I ensured her that we did not add any medications on Tuesday, only stopped the metoprolol and amiodarone. Additionally, her heart rate has increased to 60 and remains normal sinus per the ECG I obtained.  While I was there I took the opportunity to verify her medications and refill her pillbox.   [name], EMT 02/16/18  ACTION: Home visit completed Next visit planned for 1 week

## 2018-02-16 NOTE — Telephone Encounter (Signed)
[name] [name] called me this morning complaining of a headache since last night. She took Tylenol but has not had relief. She believes her blood pressure is elevated. She was taken off Metoprolol earlier this week so I will go out and evaluate today.

## 2018-02-22 ENCOUNTER — Telehealth (HOSPITAL_COMMUNITY): Payer: Self-pay

## 2018-02-22 NOTE — Telephone Encounter (Signed)
I contacted [name] [name] to schedule an appointment. She stated she would be able to meet tomorrow morning at 11:00.

## 2018-02-23 ENCOUNTER — Other Ambulatory Visit (HOSPITAL_COMMUNITY): Payer: Self-pay

## 2018-02-23 NOTE — Progress Notes (Signed)
Paramedicine Encounter    Patient ID: [name] [name], female    DOB: Jul 04, 1946, 72 y.o.   MRN: [id number]   Patient Care Team: [name], MD as PCP - General (Internal Medicine) Debara Pickett Nadean Corwin, MD as PCP - Cardiology (Cardiology) Debara Pickett Nadean Corwin, MD as Consulting Physician (Cardiology)  Patient Active Problem List   Diagnosis Date Noted  . Postmenopausal vaginal bleeding 01/04/2018  . Snoring 01/04/2018  . Acute on chronic combined systolic and diastolic CHF (congestive heart failure) ([place])   . CHF exacerbation ([place]) 10/16/2017  . Noncompliance 10/16/2017  . Acute on chronic congestive heart failure ([place])   . Influenza with respiratory manifestation flu a + 09/14/16 started on tamiflu 09/15/17 09/19/2017  . Thrombocytopenia ([place]) 09/14/2017  . Acute drug-induced gout of right foot   . Medication noncompliance due to cognitive impairment 09/06/2017  . Acute diastolic CHF (congestive heart failure) ([place]) 08/06/2017  . LGI bleed, likely hemorrhoids 08/06/2017  . Atrial fibrillation ([place]) 08/04/2017  . Paroxysmal atrial fibrillation ([place]) 02/08/2017  . Cardiomyopathy, ischemic 02/08/2017  . Dysphagia 02/08/2017  . CKD (chronic kidney disease), stage III ([place]) 01/30/2017  . Pain in shoulder 02/08/2016  . Breast pain, left 02/08/2016  . Bilateral arm numbness and tingling while sleeping 02/08/2016  . Painful lumpy left breast 09/23/2015  . Candidal intertrigo 02/11/2015  . S/P CABG x 2 11/14/2014  . Accelerated hypertension   . Cardiac arrest ([place]) 11/04/2014  . Left main coronary artery disease 11/04/2014  . Coronary artery disease due to lipid rich plaque   . Acute respiratory failure with hypoxemia ([place])   . Essential hypertension   . Atrial fibrillation with RVR ([place])   . Hypokalemia 10/30/2014  . Chronic diastolic CHF (congestive heart failure) ([place]) 10/30/2014  . NSTEMI (non-ST elevated myocardial infarction) ([place])   . [name] (dyspnea on exertion)   . CAP (community  acquired pneumonia) 10/29/2014    Current Outpatient Medications:  .  acetaminophen (TYLENOL) 500 MG tablet, Take 500 mg by mouth every 6 (six) hours as needed for moderate pain. , Disp: , Rfl:  .  allopurinol (ZYLOPRIM) 300 MG tablet, Take 1 tablet (300 mg total) by mouth daily., Disp: 30 tablet, Rfl: 11 .  atorvastatin (LIPITOR) 10 MG tablet, Take 10 mg by mouth daily., Disp: , Rfl:  .  furosemide (LASIX) 20 MG tablet, TAKE 3 TABLETS (60 MG TOTAL) BY MOUTH 2 (TWO) TIMES DAILY., Disp: 180 tablet, Rfl: 11 .  magnesium oxide (MAG-OX) 400 MG tablet, Take 1 tablet (400 mg total) by mouth 3 (three) times daily., Disp: 270 tablet, Rfl: 3 .  rivaroxaban (XARELTO) 20 MG TABS tablet, Take 1 tablet (20 mg total) by mouth daily with supper., Disp: 90 tablet, Rfl: 1 .  sacubitril-valsartan (ENTRESTO) 24-26 MG, Take 1 tablet by mouth 2 (two) times daily., Disp: 60 tablet, Rfl: 3 .  budesonide (PULMICORT) 0.5 MG/2ML nebulizer solution, Take 2 mLs (0.5 mg total) by nebulization 2 (two) times daily. (Patient not taking: Reported on 02/06/2018), Disp: , Rfl: 12 .  fluticasone (FLONASE) 50 MCG/ACT nasal spray, Place 1 spray into both nostrils daily. (Patient not taking: Reported on 02/06/2018), Disp: 16 g, Rfl: 2 .  levalbuterol (XOPENEX) 0.63 MG/3ML nebulizer solution, Take 3 mLs (0.63 mg total) by nebulization every 6 (six) hours as needed for wheezing or shortness of breath. (Patient not taking: Reported on 02/06/2018), Disp: 3 mL, Rfl: 12 .  ranitidine (ZANTAC) 150 MG capsule, Take 150 mg by mouth 2 (two) times  daily as needed for heartburn. , Disp: , Rfl:  Allergies  Allergen Reactions  . Bee Venom Anaphylaxis  . Codeine Nausea And Vomiting  . Shrimp [Shellfish Allergy] Swelling  . Tomato     Pt states it gives her gout      Social History   Socioeconomic History  . Marital status: Widowed    Spouse name: Not on file  . Number of children: Not on file  . Years of education: Not on file  . Highest  education level: Not on file  Occupational History  . Not on file  Social Needs  . Financial resource strain: Not on file  . Food insecurity:    Worry: Not on file    Inability: Not on file  . Transportation needs:    Medical: Not on file    Non-medical: Not on file  Tobacco Use  . Smoking status: Former Smoker    Packs/day: 0.10    Years: 56.00    Pack years: 5.60    Types: Cigarettes  . Smokeless tobacco: Never Used  Substance and Sexual Activity  . Alcohol use: No    Alcohol/week: 0.0 oz  . Drug use: No  . Sexual activity: Not on file  Lifestyle  . Physical activity:    Days per week: Not on file    Minutes per session: Not on file  . Stress: Not on file  Relationships  . Social connections:    Talks on phone: Not on file    Gets together: Not on file    Attends religious service: Not on file    Active member of club or organization: Not on file    Attends meetings of clubs or organizations: Not on file    Relationship status: Not on file  . Intimate partner violence:    Fear of current or ex partner: Not on file    Emotionally abused: Not on file    Physically abused: Not on file    Forced sexual activity: Not on file  Other Topics Concern  . Not on file  Social History Narrative  . Not on file    Physical Exam  Constitutional: She is oriented to person, place, and time.  Cardiovascular: Regular rhythm. Bradycardia present.  Pulmonary/Chest: Effort normal and breath sounds normal.  Abdominal: Soft.  Musculoskeletal: Normal range of motion. She exhibits edema.  Neurological: She is alert and oriented to person, place, and time.  Skin: Skin is warm and dry.  Psychiatric: She has a normal mood and affect.        Future Appointments  Date Time Provider [place]  03/06/2018  2:00 PM [facility] PA/NP [facility] None    BP 136/68 (BP Location: Left Arm, Patient Position: Sitting, Cuff Size: Large)   Pulse (!) 55   Resp 16   Wt 200 lb (90.7 kg)    SpO2 98%   BMI 29.97 kg/m   Weight yesterday- 198 lb Last visit weight- 197 lb  [name] [name] was seen at home today. She denied SOB, dizziness or orthopnea. She did report having intermittent headaches and right sided chest tightness. She said the headaches are primarily in the front of her head and she had notice some nasal drainage in her throat. I advised her to get OTC sinus headache medication to try and if her chest pain returns she should contact me, the HF clinic or call 911 immediately. An ECG was performed and she was found to be in a sinus bradycardia  at 55 bpm with is in line with her baseline since changing medications last week. Her medications were verified and her pillbox was refilled.   [name] [name], EMT 02/23/18  ACTION: Home visit completed Next visit planned for 1 week

## 2018-02-28 ENCOUNTER — Telehealth (HOSPITAL_COMMUNITY): Payer: Self-pay

## 2018-02-28 NOTE — Telephone Encounter (Signed)
I contacted [name] [name] to schedule an appointment for tomorrow. She stated she would not be up before 10 so we agreed to meet at 13:00.

## 2018-03-01 ENCOUNTER — Other Ambulatory Visit (HOSPITAL_COMMUNITY): Payer: Self-pay

## 2018-03-01 NOTE — Progress Notes (Signed)
Paramedicine Encounter    Patient ID: MERIDITH ROMICK, female    DOB: 1946-07-23, 72 y.o.   MRN: [id number]   Patient Care Team: [name], MD as PCP - General (Internal Medicine) Debara Pickett Nadean Corwin, MD as PCP - Cardiology (Cardiology) Debara Pickett Nadean Corwin, MD as Consulting Physician (Cardiology)  Patient Active Problem List   Diagnosis Date Noted  . Postmenopausal vaginal bleeding 01/04/2018  . Snoring 01/04/2018  . Acute on chronic combined systolic and diastolic CHF (congestive heart failure) ([place])   . CHF exacerbation ([place]) 10/16/2017  . Noncompliance 10/16/2017  . Acute on chronic congestive heart failure ([place])   . Influenza with respiratory manifestation flu a + 09/14/16 started on tamiflu 09/15/17 09/19/2017  . Thrombocytopenia ([place]) 09/14/2017  . Acute drug-induced gout of right foot   . Medication noncompliance due to cognitive impairment 09/06/2017  . Acute diastolic CHF (congestive heart failure) ([place]) 08/06/2017  . LGI bleed, likely hemorrhoids 08/06/2017  . Atrial fibrillation ([place]) 08/04/2017  . Paroxysmal atrial fibrillation ([place]) 02/08/2017  . Cardiomyopathy, ischemic 02/08/2017  . Dysphagia 02/08/2017  . CKD (chronic kidney disease), stage III ([place]) 01/30/2017  . Pain in shoulder 02/08/2016  . Breast pain, left 02/08/2016  . Bilateral arm numbness and tingling while sleeping 02/08/2016  . Painful lumpy left breast 09/23/2015  . Candidal intertrigo 02/11/2015  . S/P CABG x 2 11/14/2014  . Accelerated hypertension   . Cardiac arrest ([place]) 11/04/2014  . Left main coronary artery disease 11/04/2014  . Coronary artery disease due to lipid rich plaque   . Acute respiratory failure with hypoxemia ([place])   . Essential hypertension   . Atrial fibrillation with RVR ([place])   . Hypokalemia 10/30/2014  . Chronic diastolic CHF (congestive heart failure) ([place]) 10/30/2014  . NSTEMI (non-ST elevated myocardial infarction) ([place])   . DOE (dyspnea on exertion)   . CAP (community  acquired pneumonia) 10/29/2014    Current Outpatient Medications:  .  acetaminophen (TYLENOL) 500 MG tablet, Take 500 mg by mouth every 6 (six) hours as needed for moderate pain. , Disp: , Rfl:  .  allopurinol (ZYLOPRIM) 300 MG tablet, Take 1 tablet (300 mg total) by mouth daily., Disp: 30 tablet, Rfl: 11 .  atorvastatin (LIPITOR) 10 MG tablet, Take 10 mg by mouth daily., Disp: , Rfl:  .  furosemide (LASIX) 20 MG tablet, TAKE 3 TABLETS (60 MG TOTAL) BY MOUTH 2 (TWO) TIMES DAILY., Disp: 180 tablet, Rfl: 11 .  magnesium oxide (MAG-OX) 400 MG tablet, Take 1 tablet (400 mg total) by mouth 3 (three) times daily., Disp: 270 tablet, Rfl: 3 .  ranitidine (ZANTAC) 150 MG capsule, Take 150 mg by mouth 2 (two) times daily as needed for heartburn. , Disp: , Rfl:  .  rivaroxaban (XARELTO) 20 MG TABS tablet, Take 1 tablet (20 mg total) by mouth daily with supper., Disp: 90 tablet, Rfl: 1 .  sacubitril-valsartan (ENTRESTO) 24-26 MG, Take 1 tablet by mouth 2 (two) times daily., Disp: 60 tablet, Rfl: 3 .  budesonide (PULMICORT) 0.5 MG/2ML nebulizer solution, Take 2 mLs (0.5 mg total) by nebulization 2 (two) times daily. (Patient not taking: Reported on 02/06/2018), Disp: , Rfl: 12 .  fluticasone (FLONASE) 50 MCG/ACT nasal spray, Place 1 spray into both nostrils daily. (Patient not taking: Reported on 02/06/2018), Disp: 16 g, Rfl: 2 .  levalbuterol (XOPENEX) 0.63 MG/3ML nebulizer solution, Take 3 mLs (0.63 mg total) by nebulization every 6 (six) hours as needed for wheezing or shortness of breath. (Patient  not taking: Reported on 02/06/2018), Disp: 3 mL, Rfl: 12 Allergies  Allergen Reactions  . Bee Venom Anaphylaxis  . Codeine Nausea And Vomiting  . Shrimp [Shellfish Allergy] Swelling  . Tomato     Pt states it gives her gout      Social History   Socioeconomic History  . Marital status: Widowed    Spouse name: Not on file  . Number of children: Not on file  . Years of education: Not on file  . Highest  education level: Not on file  Occupational History  . Not on file  Social Needs  . Financial resource strain: Not on file  . Food insecurity:    Worry: Not on file    Inability: Not on file  . Transportation needs:    Medical: Not on file    Non-medical: Not on file  Tobacco Use  . Smoking status: Former Smoker    Packs/day: 0.10    Years: 56.00    Pack years: 5.60    Types: Cigarettes  . Smokeless tobacco: Never Used  Substance and Sexual Activity  . Alcohol use: No    Alcohol/week: 0.0 oz  . Drug use: No  . Sexual activity: Not on file  Lifestyle  . Physical activity:    Days per week: Not on file    Minutes per session: Not on file  . Stress: Not on file  Relationships  . Social connections:    Talks on phone: Not on file    Gets together: Not on file    Attends religious service: Not on file    Active member of club or organization: Not on file    Attends meetings of clubs or organizations: Not on file    Relationship status: Not on file  . Intimate partner violence:    Fear of current or ex partner: Not on file    Emotionally abused: Not on file    Physically abused: Not on file    Forced sexual activity: Not on file  Other Topics Concern  . Not on file  Social History Narrative  . Not on file    Physical Exam  Constitutional: She is oriented to person, place, and time.  Cardiovascular: Regular rhythm. Bradycardia present.  Pulmonary/Chest: Effort normal and breath sounds normal.  Abdominal: Soft.  Musculoskeletal: Normal range of motion. She exhibits edema.  Neurological: She is alert and oriented to person, place, and time.  Skin: Skin is warm and dry.  Psychiatric: She has a normal mood and affect.        Future Appointments  Date Time Provider [place]  03/06/2018  2:00 PM [facility] PA/NP [facility] None    BP (!) 160/90 (BP Location: Left Arm, Patient Position: Sitting, Cuff Size: Large)   Pulse (!) 55   Resp 16   Wt 199 lb 6.4 oz  (90.4 kg)   SpO2 98%   BMI 29.88 kg/m   Weight yesterday- 200 lb Last visit weight- 200 lb  Ms Mione was seen at ome today and reported feeling well. She reported having intermittent back pain and right sided chest pain. She said she had felt some left sided chest pain but no pain we current. She reported that is seems to be worse when she wakes up in the morning. She is taking Zantac PRN but does not take anything daily. I suggested she speak to her PCP to see if she should try a daily medication to make sure she is not  having indigestion causing these episodes of discomfort. She has a clinic appointment next week so we will discuss it more there.  [name], EMT 03/01/18  ACTION: Home visit completed Next visit planned for 1 week

## 2018-03-02 ENCOUNTER — Other Ambulatory Visit (HOSPITAL_COMMUNITY): Payer: Self-pay | Admitting: Cardiology

## 2018-03-02 MED ORDER — RIVAROXABAN 20 MG PO TABS: 20.0000 mg | ORAL_TABLET | Freq: Every day | ORAL | 11 refills | Status: DC

## 2018-03-02 NOTE — Telephone Encounter (Signed)
rx printed to accompany PAP application

## 2018-03-06 ENCOUNTER — Encounter (HOSPITAL_COMMUNITY): Payer: PPO

## 2018-03-06 ENCOUNTER — Other Ambulatory Visit (HOSPITAL_COMMUNITY): Payer: Self-pay

## 2018-03-06 ENCOUNTER — Telehealth (HOSPITAL_COMMUNITY): Payer: Self-pay

## 2018-03-06 NOTE — Telephone Encounter (Signed)
I called Ms Ziesmer to see if she was alright after she missed her clinic appointment. She stated she was fine but thought he appointment was tomorrow. She asked if I would be able to reschedule her to tomorrow so I spoke to clinic staff who made the change. I will follow up at her appointment tomorrow.

## 2018-03-06 NOTE — Progress Notes (Signed)
I arrived at the clinic for [name] [name]' appointment. After waiting 30 minutes she was deemed to be a "No Show."

## 2018-03-07 ENCOUNTER — Other Ambulatory Visit (HOSPITAL_COMMUNITY): Payer: Self-pay

## 2018-03-07 ENCOUNTER — Ambulatory Visit (HOSPITAL_COMMUNITY)
Admission: RE | Admit: 2018-03-07 | Discharge: 2018-03-07 | Disposition: A | Discharge status: Home / Self Care | Payer: PPO | Source: Ambulatory Visit | Attending: Internal Medicine | Admitting: Internal Medicine

## 2018-03-07 ENCOUNTER — Encounter (HOSPITAL_COMMUNITY): Payer: Self-pay

## 2018-03-07 VITALS — BP 146/74 | HR 56 | Wt 204.0 lb

## 2018-03-07 DIAGNOSIS — M109 Gout, unspecified: Secondary | ICD-10-CM | POA: Diagnosis not present

## 2018-03-07 DIAGNOSIS — I2510 Atherosclerotic heart disease of native coronary artery without angina pectoris: Secondary | ICD-10-CM | POA: Insufficient documentation

## 2018-03-07 DIAGNOSIS — N183 Chronic kidney disease, stage 3 (moderate): Secondary | ICD-10-CM | POA: Insufficient documentation

## 2018-03-07 DIAGNOSIS — E785 Hyperlipidemia, unspecified: Secondary | ICD-10-CM | POA: Diagnosis not present

## 2018-03-07 DIAGNOSIS — I480 Paroxysmal atrial fibrillation: Secondary | ICD-10-CM | POA: Insufficient documentation

## 2018-03-07 DIAGNOSIS — Z87891 Personal history of nicotine dependence: Secondary | ICD-10-CM | POA: Insufficient documentation

## 2018-03-07 DIAGNOSIS — Z91030 Bee allergy status: Secondary | ICD-10-CM | POA: Diagnosis not present

## 2018-03-07 DIAGNOSIS — Z79899 Other long term (current) drug therapy: Secondary | ICD-10-CM | POA: Diagnosis not present

## 2018-03-07 DIAGNOSIS — Z951 Presence of aortocoronary bypass graft: Secondary | ICD-10-CM | POA: Diagnosis not present

## 2018-03-07 DIAGNOSIS — Z8249 Family history of ischemic heart disease and other diseases of the circulatory system: Secondary | ICD-10-CM | POA: Diagnosis not present

## 2018-03-07 DIAGNOSIS — M1990 Unspecified osteoarthritis, unspecified site: Secondary | ICD-10-CM | POA: Insufficient documentation

## 2018-03-07 DIAGNOSIS — I255 Ischemic cardiomyopathy: Secondary | ICD-10-CM | POA: Diagnosis not present

## 2018-03-07 DIAGNOSIS — I10 Essential (primary) hypertension: Secondary | ICD-10-CM | POA: Diagnosis not present

## 2018-03-07 DIAGNOSIS — Z7901 Long term (current) use of anticoagulants: Secondary | ICD-10-CM | POA: Diagnosis not present

## 2018-03-07 DIAGNOSIS — Z8719 Personal history of other diseases of the digestive system: Secondary | ICD-10-CM | POA: Diagnosis not present

## 2018-03-07 DIAGNOSIS — Z91013 Allergy to seafood: Secondary | ICD-10-CM | POA: Insufficient documentation

## 2018-03-07 DIAGNOSIS — I130 Hypertensive heart and chronic kidney disease with heart failure and stage 1 through stage 4 chronic kidney disease, or unspecified chronic kidney disease: Secondary | ICD-10-CM | POA: Diagnosis not present

## 2018-03-07 DIAGNOSIS — I5032 Chronic diastolic (congestive) heart failure: Secondary | ICD-10-CM

## 2018-03-07 DIAGNOSIS — I5042 Chronic combined systolic (congestive) and diastolic (congestive) heart failure: Secondary | ICD-10-CM | POA: Diagnosis not present

## 2018-03-07 DIAGNOSIS — Z885 Allergy status to narcotic agent status: Secondary | ICD-10-CM | POA: Diagnosis not present

## 2018-03-07 LAB — BASIC METABOLIC PANEL
Anion gap: 8 (ref 5–15)
BUN: 20 mg/dL (ref 8–23)
CO2: 26 mmol/L (ref 22–32)
Calcium: 9.1 mg/dL (ref 8.9–10.3)
Chloride: 110 mmol/L (ref 98–111)
Creatinine, Ser: 1.26 mg/dL — ABNORMAL HIGH (ref 0.44–1.00)
GFR calc Af Amer: 48 mL/min — ABNORMAL LOW (ref >60)
GFR calc non Af Amer: 41 mL/min — ABNORMAL LOW (ref >60)
Glucose, Bld: 100 mg/dL — ABNORMAL HIGH (ref 70–99)
Potassium: 3.5 mmol/L (ref 3.5–5.1)
Sodium: 144 mmol/L (ref 135–145)

## 2018-03-07 MED ORDER — SPIRONOLACTONE 25 MG PO TABS: 12.5000 mg | ORAL_TABLET | Freq: Every day | ORAL | 3 refills | Status: DC

## 2018-03-07 NOTE — Progress Notes (Signed)
Advanced Heart Failure Clinic Consult Note   PCP: [name], MD PCP-Cardiologist: [name], MD  HF: Dr Haroldine Laws   HPI: [name] [name] is a 72 y.o. female with h/o HTN, Tobacco abuse, PAF, CAD s/p CABG x 2 (LIMA to LAD and SVG to OM) in 2016, Chronic combined CHF, Ischemic CMP, HLD, and DOE.   She was initially noted to be in atrial fibrillation in 2016 with a reduced LVEF, leading to ischemic workup with a cardiac cath showing severe multivessel CAD with LM involvement in which she underwent CABG x2 utilizing LIMA to LAD, SVG to OM.   Admitted 1/19 for AF with RVR in setting of Influenza A. During that hospitalization, she was started on diltiazem drip, EP was consulted to discuss plans for Tikosyn vs. ablation vs. medication rate control however, due to her known medication noncompliance she was found to not be an ablation nor antiarrhythmic candidate  Pt admitted 3/3 - 10/20/17 with recurrent Afib RVR and ADHF. Started on diltiazem. Echo repeated which showed fall in EF from previous. Diuresed with IV lasix with 5L negative for admission. Discharged home on diltiazem 360, Torpol 100, digoxin and Xarelto.  Seen by Dr. Debara Pickett 11/20/17, was feeling well overall. Of note, she reported not remembering her recent hospitalization. She was also noted to be out of her metoprolol for approximately a month.   She was found to be in Afib. She was started on amiodarone and diltiazem was DCd. She was also started on Entresto. She underwent successful atrial flutter TEE/DCCV on 12/25/17. She was back in rate controlled afib on 5/16 during paramedic visit.  In May Toprol XL was cut back to 50 mg per day due to bradycardia.   Today she returns for HF follow up. Last visit, amiodarone was decreased to 200 mg daily due to bradycardia in the 40s. Zach with paramedicine called to report ongoing bradycardia in the 40s, so amio and metoprolol were stopped. Overall doing okay. Denies SOB when walking around  the mall. Does not do steps or hills. She has been having some chest discomfort with bending over and lying on left side. No SOB or associated symptoms. No CP with activity. Discomfort improves with Tylenol. Denies orthopnea. She has occasional mild BLE edema. Energy level okay. No dizziness. She limits fluid intake, but uses salt on her foods. Taking all medications except she was only taking magnesium BID instead of TID. Followed by HF paramedicine.  Echo in 3/16 EF 35-40%. Echo in 10/17 EF improved to 50-55%.  F/u echo 5/18 EF stable 50-55% Echo 10/19/17 LVEF 30-35%, Moderate LVH, Mild AI, Moderate regurgitation, Severe LAE, Moderately reduced RV, Severe RAE, PA peak pressure 50 mm Hg.   (Decreased from Echo 01/11/2017 with LVEF 50-55%)  Review of systems complete and found to be negative unless listed in HPI.   Past Medical History:  Diagnosis Date  . Acute respiratory failure ([place]) 08/2017  . Arthritis   . Asthma    ??  . CAD (coronary artery disease)    a. s/p CABG 2016.  . Cardiomyopathy, ischemic    a. EF previously low, improved to LVEF 50-55% as of May 2018  . Chronic combined systolic and diastolic heart failure ([place])   . CKD (chronic kidney disease), stage III ([place])   . Gout   . Hypertension   . Hypokalemia   . LGI bleed 08/06/2017   a. felt to be hemorrhoidal during that admission (no drop in Hgb).  . Persistent  atrial fibrillation ([place])    a. h/o difficult to control rates (complicated by noncompliance), not felt to be a candidate for ablation or antiarrhythmic due to noncompliance.  . Personal history of noncompliance with medical treatment, presenting hazards to health   . S/P CABG x 2 with clipping of LA appendage 11/14/2014   LIMA to LAD, SVG to OM, EVH via right thigh   Current Outpatient Medications  Medication Sig Dispense Refill  . acetaminophen (TYLENOL) 500 MG tablet Take 500 mg by mouth every 6 (six) hours as needed for moderate pain.     Marland Kitchen allopurinol  (ZYLOPRIM) 300 MG tablet Take 1 tablet (300 mg total) by mouth daily. 30 tablet 11  . atorvastatin (LIPITOR) 10 MG tablet Take 10 mg by mouth daily.    . budesonide (PULMICORT) 0.5 MG/2ML nebulizer solution Take 2 mLs (0.5 mg total) by nebulization 2 (two) times daily. (Patient not taking: Reported on 03/07/2018)  12  . fluticasone (FLONASE) 50 MCG/ACT nasal spray Place 1 spray into both nostrils daily. (Patient not taking: Reported on 03/07/2018) 16 g 2  . furosemide (LASIX) 20 MG tablet TAKE 3 TABLETS (60 MG TOTAL) BY MOUTH 2 (TWO) TIMES DAILY. 180 tablet 11  . levalbuterol (XOPENEX) 0.63 MG/3ML nebulizer solution Take 3 mLs (0.63 mg total) by nebulization every 6 (six) hours as needed for wheezing or shortness of breath. (Patient not taking: Reported on 03/07/2018) 3 mL 12  . magnesium oxide (MAG-OX) 400 MG tablet Take 1 tablet (400 mg total) by mouth 3 (three) times daily. 270 tablet 3  . ranitidine (ZANTAC) 150 MG capsule Take 150 mg by mouth 2 (two) times daily as needed for heartburn.     . rivaroxaban (XARELTO) 20 MG TABS tablet Take 1 tablet (20 mg total) by mouth daily with supper. 30 tablet 11  . sacubitril-valsartan (ENTRESTO) 24-26 MG Take 1 tablet by mouth 2 (two) times daily. 60 tablet 3   No current facility-administered medications for this encounter.    Allergies  Allergen Reactions  . Bee Venom Anaphylaxis  . Codeine Nausea And Vomiting  . Shrimp [Shellfish Allergy] Swelling  . Tomato     Pt states it gives her gout   Social History   Socioeconomic History  . Marital status: Widowed    Spouse name: Not on file  . Number of children: Not on file  . Years of education: Not on file  . Highest education level: Not on file  Occupational History  . Not on file  Social Needs  . Financial resource strain: Not on file  . Food insecurity:    Worry: Not on file    Inability: Not on file  . Transportation needs:    Medical: Not on file    Non-medical: Not on file  Tobacco  Use  . Smoking status: Former Smoker    Packs/day: 0.10    Years: 56.00    Pack years: 5.60    Types: Cigarettes  . Smokeless tobacco: Never Used  Substance and Sexual Activity  . Alcohol use: No    Alcohol/week: 0.0 oz  . Drug use: No  . Sexual activity: Not on file  Lifestyle  . Physical activity:    Days per week: Not on file    Minutes per session: Not on file  . Stress: Not on file  Relationships  . Social connections:    Talks on phone: Not on file    Gets together: Not on file    Attends  religious service: Not on file    Active member of club or organization: Not on file    Attends meetings of clubs or organizations: Not on file    Relationship status: Not on file  . Intimate partner violence:    Fear of current or ex partner: Not on file    Emotionally abused: Not on file    Physically abused: Not on file    Forced sexual activity: Not on file  Other Topics Concern  . Not on file  Social History Narrative  . Not on file   Family History  Problem Relation Age of Onset  . Cancer Mother   . Heart disease Father    Vitals:   03/07/18 1407  BP: (!) 146/74  Pulse: (!) 56  SpO2: 97%  Weight: 204 lb (92.5 kg)   Wt Readings from Last 3 Encounters:  03/07/18 204 lb (92.5 kg)  03/01/18 199 lb 6.4 oz (90.4 kg)  02/23/18 200 lb (90.7 kg)    PHYSICAL EXAM: General:  Well appearing. No resp difficulty HEENT: normal Neck: supple. no JVD. Carotids 2+ bilat; no bruits. No lymphadenopathy or thryomegaly appreciated. Cor: PMI nondisplaced. Regular rate & rhythm. No rubs, gallops or murmurs. Lungs: clear Abdomen: soft, nontender, nondistended. No hepatosplenomegaly. No bruits or masses. Good bowel sounds. Extremities: no cyanosis, clubbing, rash, BLE trace ankle edema Neuro: alert & orientedx3, cranial nerves grossly intact. moves all 4 extremities w/o difficulty. Affect pleasant   ECG: Sinus Brady 48 bpm. Personally reviewed.   ASSESSMENT & PLAN:  1. Chronic  combined CHF - Suspect etiology is combined Ischemic/NICM with Afib/RVR for nearly a year.   - Echo 10/19/17 LVEF 30-35%, Moderate LVH, Mild AI, Moderate regurgitation, Severe LAE, Moderately reduced RV, Severe RAE, PA peak pressure 50 mm Hg.   (Decreased from Echo 01/11/2017 with LVEF 50-55%) - NYHA II. Volume status stable.  Continue lasix 60 mg BID.  - Off BB with bradycardia - Contnue digoxin 0.125 mg daily. Dig level 0.5 10/2017 - Continue Entresto 24/26 mg BID.  - Restart spiro 12.5 mg daily. Had hyperkalemia last month, but was taking potassium daily in addition to spiro. Last K 3.6  2. CAD s/p CABG x 2 2016 (LIMA to LAD and SVG to OM) - No s/s ischemia.  - Continue statin. Stop ASA (on xarelto). - Continue allopurinol 300 mg daily. Recent study published (CARES trial) that showed an increased risk of death with uloric in patients with CAD. No longer on uloric - She has been having some positional CP that improves with tylenol. No associated symptoms and no exertional CP.   3. PAF s/p TEE/DCCV on 12/25/17 - CHA2DS2-VASc is at least 5.  - Continue xarelto. Samples provided today - Maintaining NSR/sinus brady.  - Off amio with bradycardia   4. HTN - Elevated today. Start spiro as above.   5. Tobacco Abuse - Stopped smoking 10/2017. Continued cessation. No change.   6. HLD - Per [facility]. No change.   7. Suspected OSA/Snoring - She had a sleep study last month and was told she needs to come back for another one. Unable to locate study in epic. She cannot afford to pay for another sleep study.  8. Social  - She has poor insight into her disease.  - Followed by HF Paramedicine.   BMET today and in 7-10 days Start spiro 12.5 mg daily Follow up 6 weeks  Georgiana Shore, NP 03/07/18   Greater than 50% of the 25  minute visit was spent in counseling/coordination of care regarding disease state education, salt/fluid restriction, sliding scale diuretics, and medication compliance.

## 2018-03-07 NOTE — Progress Notes (Signed)
Paramedicine Encounter   Patient ID: [name] [name] , female,   DOB: 15-May-1946,72 y.o.,  MRN: [id number]  [name] [name] was seen at the HF clinic today with [name] Mountain, NP. Per [name] [name], [name] [name] is to begin taking spironolactone 12.5 mg daily. I filled [name] [name]' pillbox and requested she call me when she had picked up the new medications so I can come by and put it in her box.  [name] [name], EMT 03/07/2018   ACTION: Home visit completed Next visit planned for 1 week

## 2018-03-07 NOTE — Patient Instructions (Signed)
Routine lab work today. Will notify you of abnormal results, otherwise no news is good news!  START Spironolactone 12.5 mg (1/2 tablet) once daily.  Return for repeat labs in 1-2 weeks.  ___________________________________________________________________ [name] [name] Code:  Follow up 6 weeks.  ____________________________________________________________________ [name] [name] Code: 5500  Take all medication as prescribed the day of your appointment. Bring all medications with you to your appointment.  Do the following things EVERYDAY: 1) Weigh yourself in the morning before breakfast. Write it down and keep it in a log. 2) Take your medicines as prescribed 3) Eat low salt foods-Limit salt (sodium) to 2000 mg per day.  4) Stay as active as you can everyday 5) Limit all fluids for the day to less than 2 liters

## 2018-03-08 ENCOUNTER — Telehealth (HOSPITAL_COMMUNITY): Payer: Self-pay

## 2018-03-08 ENCOUNTER — Other Ambulatory Visit (HOSPITAL_COMMUNITY): Payer: Self-pay

## 2018-03-08 NOTE — Telephone Encounter (Signed)
I called [name] [name] to see if she had picked up her new prescription yesterday. She stated she was not able to get it but would go by the pharmacy this morning and see if it has been filled. She advised she would call me when she has the medicine so I can come by and place it in her box.

## 2018-03-08 NOTE — Progress Notes (Signed)
I saw [name] [name] at home today for a brief moment so I could add spironolactone to her pillbox. She had taken her morning and noon medications already so I gave her a half tablet of spironolactone to take today rather than putting it in her pillbox. She reported feeling well and did not need any further assistance.

## 2018-03-13 ENCOUNTER — Other Ambulatory Visit (HOSPITAL_COMMUNITY): Payer: Self-pay

## 2018-03-13 ENCOUNTER — Telehealth (HOSPITAL_COMMUNITY): Payer: Self-pay

## 2018-03-13 NOTE — Progress Notes (Signed)
Paramedicine Encounter    Patient ID: [name] [name], female    DOB: 04-23-1946, 72 y.o.   MRN: [id number]   Patient Care Team: [name], MD as PCP - General (Internal Medicine) Debara Pickett Nadean Corwin, MD as PCP - Cardiology (Cardiology) Debara Pickett Nadean Corwin, MD as Consulting Physician (Cardiology)  Patient Active Problem List   Diagnosis Date Noted  . Postmenopausal vaginal bleeding 01/04/2018  . Snoring 01/04/2018  . Acute on chronic combined systolic and diastolic CHF (congestive heart failure) ([place])   . CHF exacerbation ([place]) 10/16/2017  . Noncompliance 10/16/2017  . Acute on chronic congestive heart failure ([place])   . Influenza with respiratory manifestation flu a + 09/14/16 started on tamiflu 09/15/17 09/19/2017  . Thrombocytopenia ([place]) 09/14/2017  . Acute drug-induced gout of right foot   . Medication noncompliance due to cognitive impairment 09/06/2017  . Acute diastolic CHF (congestive heart failure) ([place]) 08/06/2017  . LGI bleed, likely hemorrhoids 08/06/2017  . Atrial fibrillation ([place]) 08/04/2017  . Paroxysmal atrial fibrillation ([place]) 02/08/2017  . Cardiomyopathy, ischemic 02/08/2017  . Dysphagia 02/08/2017  . CKD (chronic kidney disease), stage III ([place]) 01/30/2017  . Pain in shoulder 02/08/2016  . Breast pain, left 02/08/2016  . Bilateral arm numbness and tingling while sleeping 02/08/2016  . Painful lumpy left breast 09/23/2015  . Candidal intertrigo 02/11/2015  . S/P CABG x 2 11/14/2014  . Accelerated hypertension   . Cardiac arrest ([place]) 11/04/2014  . Left main coronary artery disease 11/04/2014  . Coronary artery disease due to lipid rich plaque   . Acute respiratory failure with hypoxemia ([place])   . Essential hypertension   . Atrial fibrillation with RVR ([place])   . Hypokalemia 10/30/2014  . Chronic diastolic CHF (congestive heart failure) ([place]) 10/30/2014  . NSTEMI (non-ST elevated myocardial infarction) ([place])   . [name] (dyspnea on exertion)   . CAP (community  acquired pneumonia) 10/29/2014    Current Outpatient Medications:  .  acetaminophen (TYLENOL) 500 MG tablet, Take 500 mg by mouth every 6 (six) hours as needed for moderate pain. , Disp: , Rfl:  .  allopurinol (ZYLOPRIM) 300 MG tablet, Take 1 tablet (300 mg total) by mouth daily., Disp: 30 tablet, Rfl: 11 .  atorvastatin (LIPITOR) 10 MG tablet, Take 10 mg by mouth daily., Disp: , Rfl:  .  furosemide (LASIX) 20 MG tablet, TAKE 3 TABLETS (60 MG TOTAL) BY MOUTH 2 (TWO) TIMES DAILY., Disp: 180 tablet, Rfl: 11 .  magnesium oxide (MAG-OX) 400 MG tablet, Take 1 tablet (400 mg total) by mouth 3 (three) times daily., Disp: 270 tablet, Rfl: 3 .  ranitidine (ZANTAC) 150 MG capsule, Take 150 mg by mouth 2 (two) times daily as needed for heartburn. , Disp: , Rfl:  .  rivaroxaban (XARELTO) 20 MG TABS tablet, Take 1 tablet (20 mg total) by mouth daily with supper., Disp: 30 tablet, Rfl: 11 .  sacubitril-valsartan (ENTRESTO) 24-26 MG, Take 1 tablet by mouth 2 (two) times daily., Disp: 60 tablet, Rfl: 3 .  spironolactone (ALDACTONE) 25 MG tablet, Take 0.5 tablets (12.5 mg total) by mouth daily., Disp: 45 tablet, Rfl: 3 .  budesonide (PULMICORT) 0.5 MG/2ML nebulizer solution, Take 2 mLs (0.5 mg total) by nebulization 2 (two) times daily. (Patient not taking: Reported on 03/07/2018), Disp: , Rfl: 12 .  fluticasone (FLONASE) 50 MCG/ACT nasal spray, Place 1 spray into both nostrils daily. (Patient not taking: Reported on 03/07/2018), Disp: 16 g, Rfl: 2 .  levalbuterol (XOPENEX) 0.63 MG/3ML nebulizer solution,  Take 3 mLs (0.63 mg total) by nebulization every 6 (six) hours as needed for wheezing or shortness of breath. (Patient not taking: Reported on 03/07/2018), Disp: 3 mL, Rfl: 12 Allergies  Allergen Reactions  . Bee Venom Anaphylaxis  . Codeine Nausea And Vomiting  . Shrimp [Shellfish Allergy] Swelling  . Tomato     Pt states it gives her gout      Social History   Socioeconomic History  . Marital status:  Widowed    Spouse name: Not on file  . Number of children: Not on file  . Years of education: Not on file  . Highest education level: Not on file  Occupational History  . Not on file  Social Needs  . Financial resource strain: Not on file  . Food insecurity:    Worry: Not on file    Inability: Not on file  . Transportation needs:    Medical: Not on file    Non-medical: Not on file  Tobacco Use  . Smoking status: Former Smoker    Packs/day: 0.10    Years: 56.00    Pack years: 5.60    Types: Cigarettes  . Smokeless tobacco: Never Used  Substance and Sexual Activity  . Alcohol use: No    Alcohol/week: 0.0 oz  . Drug use: No  . Sexual activity: Not on file  Lifestyle  . Physical activity:    Days per week: Not on file    Minutes per session: Not on file  . Stress: Not on file  Relationships  . Social connections:    Talks on phone: Not on file    Gets together: Not on file    Attends religious service: Not on file    Active member of club or organization: Not on file    Attends meetings of clubs or organizations: Not on file    Relationship status: Not on file  . Intimate partner violence:    Fear of current or ex partner: Not on file    Emotionally abused: Not on file    Physically abused: Not on file    Forced sexual activity: Not on file  Other Topics Concern  . Not on file  Social History Narrative  . Not on file    Physical Exam  Constitutional: She is oriented to person, place, and time.  Cardiovascular: Normal rate and regular rhythm.  Pulmonary/Chest: Effort normal and breath sounds normal.  Abdominal: Soft.  Musculoskeletal: Normal range of motion. She exhibits edema.  Neurological: She is alert and oriented to person, place, and time.  Skin: Skin is warm and dry.  Psychiatric: She has a normal mood and affect.        Future Appointments  Date Time Provider [place]  03/15/2018  2:00 PM [facility] None  04/17/2018  1:30 PM  [facility] PA/NP [facility] None    BP (!) 142/80 (BP Location: Left Arm, Patient Position: Sitting, Cuff Size: Large)   Pulse 65   Resp 16   Wt 209 lb 6.4 oz (95 kg)   SpO2 97%   BMI 31.38 kg/m   Weight yesterday- Unable to obtain Last visit weight- 204 lb  [name] [name] was seen at home today and reported feeling well. She denied SOB, headache, dizziness or orthopnea. She had missed a few medications doses over the past week but had taken most. Her medications were verified and her pillbox was refilled. She ran out of entresto while I was filling her pillbox however  I obtained the necessary information from the [facility] and contacted the pharmacy who was able to process the prescription while I was on the phone with them. I also asked the [employer] [facility] to sent [name] [name] another card in the mail since she did not get the first one. [name] [name] was told and shown where the entresto goes [employer]" and "[place]" of Saturday through Monday) when she picks it up this evening and she expressed understanding. She continues to complain of the chest and back discomfort which occurs primarily in the morning and increases with movement. She believes that it could be the entresto causing her discomfort however the clinic staff and I have both told her it is not the entresto. She asked if she could try breaking the entresto in half and try that but I explained that it should not be broken at all. She seemed to be dissatisfied but overall agreeable. No further issues today.   [name] [name], EMT 03/13/18  ACTION: Home visit completed Next visit planned for 1 week

## 2018-03-13 NOTE — Telephone Encounter (Signed)
I called [name] [name] to schedule an appointment. She stated she would be available for a visit this afternoon at 15:00.

## 2018-03-15 ENCOUNTER — Ambulatory Visit (HOSPITAL_COMMUNITY)
Admission: RE | Admit: 2018-03-15 | Discharge: 2018-03-15 | Disposition: A | Discharge status: Home / Self Care | Payer: PPO | Source: Ambulatory Visit | Attending: Internal Medicine | Admitting: Internal Medicine

## 2018-03-15 ENCOUNTER — Telehealth (HOSPITAL_COMMUNITY): Payer: Self-pay | Admitting: Surgery

## 2018-03-15 DIAGNOSIS — I5032 Chronic diastolic (congestive) heart failure: Secondary | ICD-10-CM | POA: Diagnosis not present

## 2018-03-15 LAB — BASIC METABOLIC PANEL
Anion gap: 10 (ref 5–15)
BUN: 24 mg/dL — ABNORMAL HIGH (ref 8–23)
CO2: 25 mmol/L (ref 22–32)
Calcium: 9.4 mg/dL (ref 8.9–10.3)
Chloride: 110 mmol/L (ref 98–111)
Creatinine, Ser: 1.25 mg/dL — ABNORMAL HIGH (ref 0.44–1.00)
GFR calc Af Amer: 48 mL/min — ABNORMAL LOW (ref >60)
GFR calc non Af Amer: 42 mL/min — ABNORMAL LOW (ref >60)
Glucose, Bld: 112 mg/dL — ABNORMAL HIGH (ref 70–99)
Potassium: 3.8 mmol/L (ref 3.5–5.1)
Sodium: 145 mmol/L (ref 135–145)

## 2018-03-15 NOTE — Telephone Encounter (Signed)
I received a call from [facility] to send a taxi for patient to bring her to [facility] appt scheduled today due to the fact that her car is not currently running.  I have called and arranged transportation for patient.

## 2018-03-20 ENCOUNTER — Telehealth (HOSPITAL_COMMUNITY): Payer: Self-pay

## 2018-03-20 NOTE — Telephone Encounter (Signed)
[name] [name] called me to see when I would be out this week. I advised that I was not available today but could see her tomorrow. She agreed and asked me to come in the morning.

## 2018-03-21 ENCOUNTER — Other Ambulatory Visit (HOSPITAL_COMMUNITY): Payer: Self-pay

## 2018-03-21 NOTE — Progress Notes (Signed)
Paramedicine Encounter    Patient ID: [name] [name], female    DOB: 1946-08-11, 72 y.o.   MRN: [id number]   Patient Care Team: [name], MD as PCP - General (Internal Medicine) Debara Pickett Nadean Corwin, MD as PCP - Cardiology (Cardiology) Debara Pickett Nadean Corwin, MD as Consulting Physician (Cardiology)  Patient Active Problem List   Diagnosis Date Noted  . Postmenopausal vaginal bleeding 01/04/2018  . Snoring 01/04/2018  . Acute on chronic combined systolic and diastolic CHF (congestive heart failure) ([place])   . CHF exacerbation ([place]) 10/16/2017  . Noncompliance 10/16/2017  . Acute on chronic congestive heart failure ([place])   . Influenza with respiratory manifestation flu a + 09/14/16 started on tamiflu 09/15/17 09/19/2017  . Thrombocytopenia ([place]) 09/14/2017  . Acute drug-induced gout of right foot   . Medication noncompliance due to cognitive impairment 09/06/2017  . Acute diastolic CHF (congestive heart failure) ([place]) 08/06/2017  . LGI bleed, likely hemorrhoids 08/06/2017  . Atrial fibrillation ([place]) 08/04/2017  . Paroxysmal atrial fibrillation ([place]) 02/08/2017  . Cardiomyopathy, ischemic 02/08/2017  . Dysphagia 02/08/2017  . CKD (chronic kidney disease), stage III ([place]) 01/30/2017  . Pain in shoulder 02/08/2016  . Breast pain, left 02/08/2016  . Bilateral arm numbness and tingling while sleeping 02/08/2016  . Painful lumpy left breast 09/23/2015  . Candidal intertrigo 02/11/2015  . S/P CABG x 2 11/14/2014  . Accelerated hypertension   . Cardiac arrest ([place]) 11/04/2014  . Left main coronary artery disease 11/04/2014  . Coronary artery disease due to lipid rich plaque   . Acute respiratory failure with hypoxemia ([place])   . Essential hypertension   . Atrial fibrillation with RVR ([place])   . Hypokalemia 10/30/2014  . Chronic diastolic CHF (congestive heart failure) ([place]) 10/30/2014  . NSTEMI (non-ST elevated myocardial infarction) ([place])   . DOE (dyspnea on exertion)   . CAP (community  acquired pneumonia) 10/29/2014    Current Outpatient Medications:  .  acetaminophen (TYLENOL) 500 MG tablet, Take 500 mg by mouth every 6 (six) hours as needed for moderate pain. , Disp: , Rfl:  .  allopurinol (ZYLOPRIM) 300 MG tablet, Take 1 tablet (300 mg total) by mouth daily., Disp: 30 tablet, Rfl: 11 .  atorvastatin (LIPITOR) 10 MG tablet, Take 10 mg by mouth daily., Disp: , Rfl:  .  furosemide (LASIX) 20 MG tablet, TAKE 3 TABLETS (60 MG TOTAL) BY MOUTH 2 (TWO) TIMES DAILY., Disp: 180 tablet, Rfl: 11 .  magnesium oxide (MAG-OX) 400 MG tablet, Take 1 tablet (400 mg total) by mouth 3 (three) times daily., Disp: 270 tablet, Rfl: 3 .  ranitidine (ZANTAC) 150 MG capsule, Take 150 mg by mouth 2 (two) times daily as needed for heartburn. , Disp: , Rfl:  .  rivaroxaban (XARELTO) 20 MG TABS tablet, Take 1 tablet (20 mg total) by mouth daily with supper., Disp: 30 tablet, Rfl: 11 .  sacubitril-valsartan (ENTRESTO) 24-26 MG, Take 1 tablet by mouth 2 (two) times daily., Disp: 60 tablet, Rfl: 3 .  spironolactone (ALDACTONE) 25 MG tablet, Take 0.5 tablets (12.5 mg total) by mouth daily., Disp: 45 tablet, Rfl: 3 .  budesonide (PULMICORT) 0.5 MG/2ML nebulizer solution, Take 2 mLs (0.5 mg total) by nebulization 2 (two) times daily. (Patient not taking: Reported on 03/07/2018), Disp: , Rfl: 12 .  fluticasone (FLONASE) 50 MCG/ACT nasal spray, Place 1 spray into both nostrils daily. (Patient not taking: Reported on 03/07/2018), Disp: 16 g, Rfl: 2 .  levalbuterol (XOPENEX) 0.63 MG/3ML nebulizer solution,  Take 3 mLs (0.63 mg total) by nebulization every 6 (six) hours as needed for wheezing or shortness of breath. (Patient not taking: Reported on 03/07/2018), Disp: 3 mL, Rfl: 12 Allergies  Allergen Reactions  . Bee Venom Anaphylaxis  . Codeine Nausea And Vomiting  . Shrimp [Shellfish Allergy] Swelling  . Tomato     Pt states it gives her gout      Social History   Socioeconomic History  . Marital status:  Widowed    Spouse name: Not on file  . Number of children: Not on file  . Years of education: Not on file  . Highest education level: Not on file  Occupational History  . Not on file  Social Needs  . Financial resource strain: Not on file  . Food insecurity:    Worry: Not on file    Inability: Not on file  . Transportation needs:    Medical: Not on file    Non-medical: Not on file  Tobacco Use  . Smoking status: Former Smoker    Packs/day: 0.10    Years: 56.00    Pack years: 5.60    Types: Cigarettes  . Smokeless tobacco: Never Used  Substance and Sexual Activity  . Alcohol use: No    Alcohol/week: 0.0 oz  . Drug use: No  . Sexual activity: Not on file  Lifestyle  . Physical activity:    Days per week: Not on file    Minutes per session: Not on file  . Stress: Not on file  Relationships  . Social connections:    Talks on phone: Not on file    Gets together: Not on file    Attends religious service: Not on file    Active member of club or organization: Not on file    Attends meetings of clubs or organizations: Not on file    Relationship status: Not on file  . Intimate partner violence:    Fear of current or ex partner: Not on file    Emotionally abused: Not on file    Physically abused: Not on file    Forced sexual activity: Not on file  Other Topics Concern  . Not on file  Social History Narrative  . Not on file    Physical Exam  Constitutional: She is oriented to person, place, and time.  Cardiovascular: Normal rate and regular rhythm.  Pulmonary/Chest: Effort normal and breath sounds normal.  Abdominal: Soft.  Musculoskeletal: Normal range of motion. She exhibits no edema.  Neurological: She is alert and oriented to person, place, and time.  Skin: Skin is warm and dry.  Psychiatric: She has a normal mood and affect.        Future Appointments  Date Time Provider [place]  04/17/2018  1:30 PM [facility] PA/NP [facility] None    BP 138/90 (BP  Location: Left Arm, Patient Position: Sitting, Cuff Size: Large)   Pulse 70   Resp 16   Wt 201 lb (91.2 kg)   SpO2 98%   BMI 30.12 kg/m   Weight yesterday- 204 lb Last visit weight- 209 lb  Ms Moxon was seen at home today and reported feeling "no better." Her complaint is intermittent chest wall pain which increases with movement and is relieved by tylenol. She stated she believes the addition of entresto is causing the discomfort however I have ensured her that the HF team does not believe it is related to her medication or heart. I have suggested she speak to  her PCP regarding this as I do not think it is related to her heart failure and she was agreeable. Her medications were verified and her pillbox was refilled.   [name], EMT 03/21/18  ACTION: Home visit completed Next visit planned for 1 week

## 2018-03-22 DIAGNOSIS — Z01419 Encounter for gynecological examination (general) (routine) without abnormal findings: Secondary | ICD-10-CM | POA: Diagnosis not present

## 2018-03-23 ENCOUNTER — Other Ambulatory Visit: Payer: Self-pay | Admitting: Internal Medicine

## 2018-03-23 ENCOUNTER — Ambulatory Visit
Admission: RE | Admit: 2018-03-23 | Discharge: 2018-03-23 | Disposition: A | Discharge status: Home / Self Care | Payer: PPO | Source: Ambulatory Visit | Attending: Internal Medicine | Admitting: Internal Medicine

## 2018-03-23 DIAGNOSIS — I4891 Unspecified atrial fibrillation: Secondary | ICD-10-CM | POA: Diagnosis not present

## 2018-03-23 DIAGNOSIS — M549 Dorsalgia, unspecified: Secondary | ICD-10-CM

## 2018-03-23 DIAGNOSIS — J984 Other disorders of lung: Secondary | ICD-10-CM | POA: Diagnosis not present

## 2018-03-26 ENCOUNTER — Telehealth: Payer: Self-pay

## 2018-03-26 NOTE — Telephone Encounter (Signed)
Called to see if pt would still like home health referral. Pt has a home nurse at the moment, seems to be satisfied with services.

## 2018-03-28 ENCOUNTER — Telehealth (HOSPITAL_COMMUNITY): Payer: Self-pay

## 2018-03-28 NOTE — Telephone Encounter (Signed)
Pt notified the heart failure clinic that her pill box need to be refilled.  I called pt's cell phone and left a message/also left a message with female who answered her home phone for her to call so I can come by. He stated that she took her friend to the hospital and he'd give her the message.

## 2018-03-28 NOTE — Telephone Encounter (Signed)
Jasmine with heart failure clinic called and advised me that pt is there requesting that someone refill her pill box.  I advised jasmine that I had called both pt's cell and home phones/ messages made with female that answered at her home and cell phone.  Jasmine advised that she will refill her pill box since she's there now.

## 2018-04-05 ENCOUNTER — Ambulatory Visit
Admission: RE | Admit: 2018-04-05 | Discharge: 2018-04-05 | Disposition: A | Discharge status: Home / Self Care | Payer: PPO | Source: Ambulatory Visit | Attending: Internal Medicine | Admitting: Internal Medicine

## 2018-04-05 ENCOUNTER — Other Ambulatory Visit: Payer: Self-pay | Admitting: Internal Medicine

## 2018-04-05 ENCOUNTER — Other Ambulatory Visit (HOSPITAL_COMMUNITY): Payer: Self-pay

## 2018-04-05 DIAGNOSIS — I10 Essential (primary) hypertension: Secondary | ICD-10-CM | POA: Diagnosis not present

## 2018-04-05 DIAGNOSIS — R7303 Prediabetes: Secondary | ICD-10-CM | POA: Diagnosis not present

## 2018-04-05 DIAGNOSIS — Z1159 Encounter for screening for other viral diseases: Secondary | ICD-10-CM | POA: Diagnosis not present

## 2018-04-05 DIAGNOSIS — Z1231 Encounter for screening mammogram for malignant neoplasm of breast: Secondary | ICD-10-CM

## 2018-04-05 DIAGNOSIS — I25810 Atherosclerosis of coronary artery bypass graft(s) without angina pectoris: Secondary | ICD-10-CM | POA: Diagnosis not present

## 2018-04-05 DIAGNOSIS — M542 Cervicalgia: Secondary | ICD-10-CM

## 2018-04-05 DIAGNOSIS — N183 Chronic kidney disease, stage 3 (moderate): Secondary | ICD-10-CM | POA: Diagnosis not present

## 2018-04-05 DIAGNOSIS — Z7189 Other specified counseling: Secondary | ICD-10-CM | POA: Diagnosis not present

## 2018-04-05 DIAGNOSIS — Z0000 Encounter for general adult medical examination without abnormal findings: Secondary | ICD-10-CM | POA: Diagnosis not present

## 2018-04-05 DIAGNOSIS — Z1389 Encounter for screening for other disorder: Secondary | ICD-10-CM | POA: Diagnosis not present

## 2018-04-05 DIAGNOSIS — E7800 Pure hypercholesterolemia, unspecified: Secondary | ICD-10-CM | POA: Diagnosis not present

## 2018-04-05 DIAGNOSIS — I4891 Unspecified atrial fibrillation: Secondary | ICD-10-CM | POA: Diagnosis not present

## 2018-04-05 DIAGNOSIS — I5020 Unspecified systolic (congestive) heart failure: Secondary | ICD-10-CM | POA: Diagnosis not present

## 2018-04-05 DIAGNOSIS — J449 Chronic obstructive pulmonary disease, unspecified: Secondary | ICD-10-CM | POA: Diagnosis not present

## 2018-04-05 DIAGNOSIS — M109 Gout, unspecified: Secondary | ICD-10-CM | POA: Diagnosis not present

## 2018-04-05 DIAGNOSIS — R202 Paresthesia of skin: Secondary | ICD-10-CM | POA: Diagnosis not present

## 2018-04-05 NOTE — Progress Notes (Signed)
Paramedicine Encounter    Patient ID: [name] [name], female    DOB: 01/31/1946, 72 y.o.   MRN: [id number]   Patient Care Team: [name], MD as PCP - General (Internal Medicine) Debara Pickett Nadean Corwin, MD as PCP - Cardiology (Cardiology) Debara Pickett Nadean Corwin, MD as Consulting Physician (Cardiology)  Patient Active Problem List   Diagnosis Date Noted  . Postmenopausal vaginal bleeding 01/04/2018  . Snoring 01/04/2018  . Acute on chronic combined systolic and diastolic CHF (congestive heart failure) ([place])   . CHF exacerbation ([place]) 10/16/2017  . Noncompliance 10/16/2017  . Acute on chronic congestive heart failure ([place])   . Influenza with respiratory manifestation flu a + 09/14/16 started on tamiflu 09/15/17 09/19/2017  . Thrombocytopenia ([place]) 09/14/2017  . Acute drug-induced gout of right foot   . Medication noncompliance due to cognitive impairment 09/06/2017  . Acute diastolic CHF (congestive heart failure) ([place]) 08/06/2017  . LGI bleed, likely hemorrhoids 08/06/2017  . Atrial fibrillation ([place]) 08/04/2017  . Paroxysmal atrial fibrillation ([place]) 02/08/2017  . Cardiomyopathy, ischemic 02/08/2017  . Dysphagia 02/08/2017  . CKD (chronic kidney disease), stage III ([place]) 01/30/2017  . Pain in shoulder 02/08/2016  . Breast pain, left 02/08/2016  . Bilateral arm numbness and tingling while sleeping 02/08/2016  . Painful lumpy left breast 09/23/2015  . Candidal intertrigo 02/11/2015  . S/P CABG x 2 11/14/2014  . Accelerated hypertension   . Cardiac arrest ([place]) 11/04/2014  . Left main coronary artery disease 11/04/2014  . Coronary artery disease due to lipid rich plaque   . Acute respiratory failure with hypoxemia ([place])   . Essential hypertension   . Atrial fibrillation with RVR ([place])   . Hypokalemia 10/30/2014  . Chronic diastolic CHF (congestive heart failure) ([place]) 10/30/2014  . NSTEMI (non-ST elevated myocardial infarction) ([place])   . DOE (dyspnea on exertion)   . CAP (community  acquired pneumonia) 10/29/2014    Current Outpatient Medications:  .  acetaminophen (TYLENOL) 500 MG tablet, Take 500 mg by mouth every 6 (six) hours as needed for moderate pain. , Disp: , Rfl:  .  allopurinol (ZYLOPRIM) 300 MG tablet, Take 1 tablet (300 mg total) by mouth daily., Disp: 30 tablet, Rfl: 11 .  atorvastatin (LIPITOR) 10 MG tablet, Take 10 mg by mouth daily., Disp: , Rfl:  .  furosemide (LASIX) 20 MG tablet, TAKE 3 TABLETS (60 MG TOTAL) BY MOUTH 2 (TWO) TIMES DAILY., Disp: 180 tablet, Rfl: 11 .  magnesium oxide (MAG-OX) 400 MG tablet, Take 1 tablet (400 mg total) by mouth 3 (three) times daily., Disp: 270 tablet, Rfl: 3 .  ranitidine (ZANTAC) 150 MG capsule, Take 150 mg by mouth 2 (two) times daily as needed for heartburn. , Disp: , Rfl:  .  rivaroxaban (XARELTO) 20 MG TABS tablet, Take 1 tablet (20 mg total) by mouth daily with supper., Disp: 30 tablet, Rfl: 11 .  sacubitril-valsartan (ENTRESTO) 24-26 MG, Take 1 tablet by mouth 2 (two) times daily., Disp: 60 tablet, Rfl: 3 .  spironolactone (ALDACTONE) 25 MG tablet, Take 0.5 tablets (12.5 mg total) by mouth daily., Disp: 45 tablet, Rfl: 3 .  budesonide (PULMICORT) 0.5 MG/2ML nebulizer solution, Take 2 mLs (0.5 mg total) by nebulization 2 (two) times daily. (Patient not taking: Reported on 03/07/2018), Disp: , Rfl: 12 .  fluticasone (FLONASE) 50 MCG/ACT nasal spray, Place 1 spray into both nostrils daily. (Patient not taking: Reported on 03/07/2018), Disp: 16 g, Rfl: 2 .  levalbuterol (XOPENEX) 0.63 MG/3ML nebulizer solution,  Take 3 mLs (0.63 mg total) by nebulization every 6 (six) hours as needed for wheezing or shortness of breath. (Patient not taking: Reported on 03/07/2018), Disp: 3 mL, Rfl: 12 Allergies  Allergen Reactions  . Bee Venom Anaphylaxis  . Codeine Nausea And Vomiting  . Shrimp [Shellfish Allergy] Swelling  . Tomato     Pt states it gives her gout      Social History   Socioeconomic History  . Marital status:  Widowed    Spouse name: Not on file  . Number of children: Not on file  . Years of education: Not on file  . Highest education level: Not on file  Occupational History  . Not on file  Social Needs  . Financial resource strain: Not on file  . Food insecurity:    Worry: Not on file    Inability: Not on file  . Transportation needs:    Medical: Not on file    Non-medical: Not on file  Tobacco Use  . Smoking status: Former Smoker    Packs/day: 0.10    Years: 56.00    Pack years: 5.60    Types: Cigarettes  . Smokeless tobacco: Never Used  Substance and Sexual Activity  . Alcohol use: No    Alcohol/week: 0.0 standard drinks  . Drug use: No  . Sexual activity: Not on file  Lifestyle  . Physical activity:    Days per week: Not on file    Minutes per session: Not on file  . Stress: Not on file  Relationships  . Social connections:    Talks on phone: Not on file    Gets together: Not on file    Attends religious service: Not on file    Active member of club or organization: Not on file    Attends meetings of clubs or organizations: Not on file    Relationship status: Not on file  . Intimate partner violence:    Fear of current or ex partner: Not on file    Emotionally abused: Not on file    Physically abused: Not on file    Forced sexual activity: Not on file  Other Topics Concern  . Not on file  Social History Narrative  . Not on file    Physical Exam  Constitutional: She is oriented to person, place, and time.  Cardiovascular: Regular rhythm. Bradycardia present.  Pulmonary/Chest: Effort normal and breath sounds normal.  Abdominal: Soft.  Musculoskeletal: Normal range of motion.  Neurological: She is alert and oriented to person, place, and time.  Skin: Skin is warm and dry.  Psychiatric: She has a normal mood and affect.        Future Appointments  Date Time Provider [place]  04/17/2018  1:30 PM [facility] PA/NP [facility] None    BP 122/68 (BP  Location: Right Arm, Patient Position: Sitting, Cuff Size: Large)   Pulse (!) 54   Resp 16   Wt 205 lb 3.2 oz (93.1 kg)   SpO2 96%   BMI 30.75 kg/m   Weight yesterday- 204 lb Last visit weight- 201 lb  Ms Ng was seen at home today and reported feeling well. She denied SOB, headache, dizziness or orthopnea. She had been compliant with her medications which were verified and her pillbox was refilled. She is nearly out of Xarelto samples and has not heard back from the assistance foundation. I will follow up with the clinic and obtain more samples if they are available.   Toula Moos  Earleen Newport, EMT 04/05/18  ACTION: Home visit completed Next visit planned for 1 week

## 2018-04-09 ENCOUNTER — Telehealth (HOSPITAL_COMMUNITY): Payer: Self-pay | Admitting: Pharmacist

## 2018-04-09 NOTE — Telephone Encounter (Signed)
[employer] patient assistance denied for Xarelto since patient does not meet eligibility requirements.

## 2018-04-11 ENCOUNTER — Telehealth (HOSPITAL_COMMUNITY): Payer: Self-pay

## 2018-04-11 NOTE — Telephone Encounter (Signed)
I called Mr Kiddy to schedule an appointment. She stated that she would not be available today so we agreed to meet Thursday at 09:00.

## 2018-04-12 ENCOUNTER — Other Ambulatory Visit (HOSPITAL_COMMUNITY): Payer: Self-pay

## 2018-04-12 ENCOUNTER — Telehealth (HOSPITAL_COMMUNITY): Payer: Self-pay | Admitting: Cardiology

## 2018-04-12 ENCOUNTER — Other Ambulatory Visit (HOSPITAL_COMMUNITY): Payer: Self-pay | Admitting: Pharmacist

## 2018-04-12 MED ORDER — TIZANIDINE HCL 2 MG PO CAPS: 2.0000 mg | ORAL_CAPSULE | Freq: Every evening | ORAL | 0 refills | Status: DC | PRN

## 2018-04-12 NOTE — Progress Notes (Signed)
Paramedicine Encounter    Patient ID: [name] [name], female    DOB: April 18, 1946, 72 y.o.   MRN: [id number]   Patient Care Team: [name], MD as PCP - General (Internal Medicine) Debara Pickett Nadean Corwin, MD as PCP - Cardiology (Cardiology) Debara Pickett Nadean Corwin, MD as Consulting Physician (Cardiology)  Patient Active Problem List   Diagnosis Date Noted  . Postmenopausal vaginal bleeding 01/04/2018  . Snoring 01/04/2018  . Acute on chronic combined systolic and diastolic CHF (congestive heart failure) ([place])   . CHF exacerbation ([place]) 10/16/2017  . Noncompliance 10/16/2017  . Acute on chronic congestive heart failure ([place])   . Influenza with respiratory manifestation flu a + 09/14/16 started on tamiflu 09/15/17 09/19/2017  . Thrombocytopenia ([place]) 09/14/2017  . Acute drug-induced gout of right foot   . Medication noncompliance due to cognitive impairment 09/06/2017  . Acute diastolic CHF (congestive heart failure) ([place]) 08/06/2017  . LGI bleed, likely hemorrhoids 08/06/2017  . Atrial fibrillation ([place]) 08/04/2017  . Paroxysmal atrial fibrillation ([place]) 02/08/2017  . Cardiomyopathy, ischemic 02/08/2017  . Dysphagia 02/08/2017  . CKD (chronic kidney disease), stage III ([place]) 01/30/2017  . Pain in shoulder 02/08/2016  . Breast pain, left 02/08/2016  . Bilateral arm numbness and tingling while sleeping 02/08/2016  . Painful lumpy left breast 09/23/2015  . Candidal intertrigo 02/11/2015  . S/P CABG x 2 11/14/2014  . Accelerated hypertension   . Cardiac arrest ([place]) 11/04/2014  . Left main coronary artery disease 11/04/2014  . Coronary artery disease due to lipid rich plaque   . Acute respiratory failure with hypoxemia ([place])   . Essential hypertension   . Atrial fibrillation with RVR ([place])   . Hypokalemia 10/30/2014  . Chronic diastolic CHF (congestive heart failure) ([place]) 10/30/2014  . NSTEMI (non-ST elevated myocardial infarction) ([place])   . DOE (dyspnea on exertion)   . CAP (community  acquired pneumonia) 10/29/2014    Current Outpatient Medications:  .  acetaminophen (TYLENOL) 500 MG tablet, Take 500 mg by mouth every 6 (six) hours as needed for moderate pain. , Disp: , Rfl:  .  atorvastatin (LIPITOR) 10 MG tablet, Take 10 mg by mouth daily., Disp: , Rfl:  .  furosemide (LASIX) 20 MG tablet, TAKE 3 TABLETS (60 MG TOTAL) BY MOUTH 2 (TWO) TIMES DAILY., Disp: 180 tablet, Rfl: 11 .  magnesium oxide (MAG-OX) 400 MG tablet, Take 1 tablet (400 mg total) by mouth 3 (three) times daily., Disp: 270 tablet, Rfl: 3 .  ranitidine (ZANTAC) 150 MG capsule, Take 150 mg by mouth 2 (two) times daily as needed for heartburn. , Disp: , Rfl:  .  rivaroxaban (XARELTO) 20 MG TABS tablet, Take 1 tablet (20 mg total) by mouth daily with supper., Disp: 30 tablet, Rfl: 11 .  sacubitril-valsartan (ENTRESTO) 24-26 MG, Take 1 tablet by mouth 2 (two) times daily., Disp: 60 tablet, Rfl: 3 .  spironolactone (ALDACTONE) 25 MG tablet, Take 0.5 tablets (12.5 mg total) by mouth daily., Disp: 45 tablet, Rfl: 3 .  allopurinol (ZYLOPRIM) 300 MG tablet, Take 1 tablet (300 mg total) by mouth daily., Disp: 30 tablet, Rfl: 11 .  budesonide (PULMICORT) 0.5 MG/2ML nebulizer solution, Take 2 mLs (0.5 mg total) by nebulization 2 (two) times daily. (Patient not taking: Reported on 03/07/2018), Disp: , Rfl: 12 .  fluticasone (FLONASE) 50 MCG/ACT nasal spray, Place 1 spray into both nostrils daily. (Patient not taking: Reported on 03/07/2018), Disp: 16 g, Rfl: 2 .  levalbuterol (XOPENEX) 0.63 MG/3ML nebulizer solution,  Take 3 mLs (0.63 mg total) by nebulization every 6 (six) hours as needed for wheezing or shortness of breath. (Patient not taking: Reported on 03/07/2018), Disp: 3 mL, Rfl: 12 Allergies  Allergen Reactions  . Bee Venom Anaphylaxis  . Codeine Nausea And Vomiting  . Shrimp [Shellfish Allergy] Swelling  . Tomato     Pt states it gives her gout      Social History   Socioeconomic History  . Marital status:  Widowed    Spouse name: Not on file  . Number of children: Not on file  . Years of education: Not on file  . Highest education level: Not on file  Occupational History  . Not on file  Social Needs  . Financial resource strain: Not on file  . Food insecurity:    Worry: Not on file    Inability: Not on file  . Transportation needs:    Medical: Not on file    Non-medical: Not on file  Tobacco Use  . Smoking status: Former Smoker    Packs/day: 0.10    Years: 56.00    Pack years: 5.60    Types: Cigarettes  . Smokeless tobacco: Never Used  Substance and Sexual Activity  . Alcohol use: No    Alcohol/week: 0.0 standard drinks  . Drug use: No  . Sexual activity: Not on file  Lifestyle  . Physical activity:    Days per week: Not on file    Minutes per session: Not on file  . Stress: Not on file  Relationships  . Social connections:    Talks on phone: Not on file    Gets together: Not on file    Attends religious service: Not on file    Active member of club or organization: Not on file    Attends meetings of clubs or organizations: Not on file    Relationship status: Not on file  . Intimate partner violence:    Fear of current or ex partner: Not on file    Emotionally abused: Not on file    Physically abused: Not on file    Forced sexual activity: Not on file  Other Topics Concern  . Not on file  Social History Narrative  . Not on file    Physical Exam  Constitutional: She is oriented to person, place, and time.  Cardiovascular: Normal rate and regular rhythm.  Pulmonary/Chest: Effort normal and breath sounds normal.  Abdominal: Soft.  Musculoskeletal: Normal range of motion. She exhibits no edema.  Neurological: She is alert and oriented to person, place, and time.  Skin: Skin is warm and dry.  Psychiatric: She has a normal mood and affect.        Future Appointments  Date Time Provider [place]  04/17/2018  1:30 PM [facility] PA/NP [facility] None   05/02/2018 12:50 PM [facility] MM 3 [facility]    BP (!) 156/78 (BP Location: Left Arm, Patient Position: Sitting, Cuff Size: Large)   Pulse 60   Resp 16   Wt 209 lb 12.8 oz (95.2 kg)   SpO2 98%   BMI 31.44 kg/m   Weight yesterday- 207 lb Last visit weight- 205 lb  Ms Antonetti was seen at home today and reported feeling well. She denied SOB, headache, dizziness or orthopnea. She reported being compliant with her medications but continues to complain of chest tightness when she wakes up in the mornings. Her PCP had prescribed multiple breathing treatments however she has yet to hear  from [facility] about getting the nebulizer kit. I will look into this for her. She also wants to see her old cardiologist to get a second opinion on this chest tightness. She has not heard from the Xarelto assistance program yet so I will get more samples from the clinic. Her medications were verified and her pillbox was refilled.   [name], EMT 04/12/18  ACTION: Home visit completed Next visit planned for tomorrow to deliver medication samples.

## 2018-04-12 NOTE — Telephone Encounter (Signed)
Zach with [facility] called to check the status of PAP application. Application was submitted on 03/08/18, advised processing time may take 6-8 weeks, will provide samples until answer is received.   Medication Samples have been provided to the patient.  Drug name: Xarelto       Strength: 20 mg        Qty: 28  LOT: [id number]  Exp.Date: 06/21  Dosing instructions: ONE TAB DAILY IN THE PM  The patient has been instructed regarding the correct time, dose, and frequency of taking this medication, including desired effects and most common side effects.   Kerry Dory 10:52 AM 04/12/2018

## 2018-04-13 NOTE — Progress Notes (Signed)
Advanced [facility] Consult Note   PCP: [name], MD PCP-Cardiologist: [name], MD  HF: Dr Haroldine Laws   HPI: [name] [name] is a 72 y.o. female with h/o HTN, Tobacco abuse, PAF, CAD s/p CABG x 2 (LIMA to LAD and SVG to OM) in 2016, Chronic combined CHF, Ischemic CMP, HLD, and DOE.   She was initially noted to be in atrial fibrillation in 2016 with a reduced LVEF, leading to ischemic workup with a cardiac cath showing severe multivessel CAD with LM involvement in which she underwent CABG x2 utilizing LIMA to LAD, SVG to OM.   Admitted 1/19 for AF with RVR in setting of Influenza A. During that hospitalization, she was started on diltiazem drip, EP was consulted to discuss plans for Tikosyn vs. ablation vs. medication rate control however, due to her known medication noncompliance she was found to not be an ablation nor antiarrhythmic candidate  Pt admitted 3/3 - 10/20/17 with recurrent Afib RVR and ADHF. Started on diltiazem. Echo repeated which showed fall in EF from previous. Diuresed with IV lasix with 5L negative for admission. Discharged home on diltiazem 360, Torpol 100, digoxin and Xarelto.  Seen by Dr. Debara Pickett 11/20/17, was feeling well overall. Of note, she reported not remembering her recent hospitalization. She was also noted to be out of her metoprolol for approximately a month.   She was found to be in Afib. She was started on amiodarone and diltiazem was DCd. She was also started on Entresto. She underwent successful atrial flutter TEE/DCCV on 12/25/17. She was back in rate controlled afib on 5/16 during paramedic visit.  In May Toprol XL was cut back to 50 mg per day due to bradycardia. Toprol DC'd with ongoing bradycardia.   Today she returns for HF follow up. Last visit spiro was restarted. Overall doing fine. Main complaint is numbness in fingertips that started 3 weeks ago. It occurs at night and is one hand or the other and is related to which side she is  lying on. Improves with massage. Denies SOB. No edema, orthopnea, or PND. Last week had some right sided CP and back pain. She thinks she lifted heavy groceries. Pain was constant. Improved with tylenol. No associated symptoms. No CP this week. Has rare lightheadedness that is not associated with standing. Taking all medications - Thedore Mins does her pillbox. Weights at home ~206 lbs, trending up. Eating more because she has stopped smoking. Limits fluid. Having a hard time limiting salt intake. Eats at cookout occasionally. Followed by HF paramedicine. SBP 120-150s when checked by Thedore Mins.   Echo in 3/16 EF 35-40%. Echo in 10/17 EF improved to 50-55%.  F/u echo 5/18 EF stable 50-55% Echo 10/19/17 LVEF 30-35%, Moderate LVH, Mild AI, Moderate regurgitation, Severe LAE, Moderately reduced RV, Severe RAE, PA peak pressure 50 mm Hg.   (Decreased from Echo 01/11/2017 with LVEF 50-55%)  Review of systems complete and found to be negative unless listed in HPI.   Past Medical History:  Diagnosis Date  . Acute respiratory failure ([place]) 08/2017  . Arthritis   . Asthma    ??  . CAD (coronary artery disease)    a. s/p CABG 2016.  . Cardiomyopathy, ischemic    a. EF previously low, improved to LVEF 50-55% as of May 2018  . Chronic combined systolic and diastolic heart failure ([place])   . CKD (chronic kidney disease), stage III ([place])   . Gout   . Hypertension   . Hypokalemia   .  LGI bleed 08/06/2017   a. felt to be hemorrhoidal during that admission (no drop in Hgb).  . Persistent atrial fibrillation ([facility])    a. h/o difficult to control rates (complicated by noncompliance), not felt to be a candidate for ablation or antiarrhythmic due to noncompliance.  . Personal history of noncompliance with medical treatment, presenting hazards to health   . S/P CABG x 2 with clipping of LA appendage 11/14/2014   LIMA to LAD, SVG to OM, EVH via right thigh   Current Outpatient Medications  Medication Sig Dispense Refill  .  acetaminophen (TYLENOL) 500 MG tablet Take 500 mg by mouth every 6 (six) hours as needed for moderate pain.     Marland Kitchen allopurinol (ZYLOPRIM) 300 MG tablet Take 1 tablet (300 mg total) by mouth daily. 30 tablet 11  . atorvastatin (LIPITOR) 10 MG tablet Take 10 mg by mouth daily.    . furosemide (LASIX) 20 MG tablet TAKE 3 TABLETS (60 MG TOTAL) BY MOUTH 2 (TWO) TIMES DAILY. 180 tablet 11  . magnesium oxide (MAG-OX) 400 MG tablet Take 1 tablet (400 mg total) by mouth 3 (three) times daily. 270 tablet 3  . ranitidine (ZANTAC) 150 MG capsule Take 150 mg by mouth 2 (two) times daily as needed for heartburn.     . rivaroxaban (XARELTO) 20 MG TABS tablet Take 1 tablet (20 mg total) by mouth daily with supper. 30 tablet 11  . sacubitril-valsartan (ENTRESTO) 24-26 MG Take 1 tablet by mouth 2 (two) times daily. 60 tablet 3  . spironolactone (ALDACTONE) 25 MG tablet Take 0.5 tablets (12.5 mg total) by mouth daily. 45 tablet 3  . tizanidine (ZANAFLEX) 2 MG capsule Take 1 capsule (2 mg total) by mouth at bedtime as needed (back pain). 30 capsule 0  . budesonide (PULMICORT) 0.5 MG/2ML nebulizer solution Take 2 mLs (0.5 mg total) by nebulization 2 (two) times daily. (Patient not taking: Reported on 03/07/2018)  12  . fluticasone (FLONASE) 50 MCG/ACT nasal spray Place 1 spray into both nostrils daily. (Patient not taking: Reported on 03/07/2018) 16 g 2  . levalbuterol (XOPENEX) 0.63 MG/3ML nebulizer solution Take 3 mLs (0.63 mg total) by nebulization every 6 (six) hours as needed for wheezing or shortness of breath. (Patient not taking: Reported on 03/07/2018) 3 mL 12   No current facility-administered medications for this encounter.    Allergies  Allergen Reactions  . Bee Venom Anaphylaxis  . Codeine Nausea And Vomiting  . Shrimp [Shellfish Allergy] Swelling  . Tomato     Pt states it gives her gout   Social History   Socioeconomic History  . Marital status: Widowed    Spouse name: Not on file  . Number of  children: Not on file  . Years of education: Not on file  . Highest education level: Not on file  Occupational History  . Not on file  Social Needs  . Financial resource strain: Not on file  . Food insecurity:    Worry: Not on file    Inability: Not on file  . Transportation needs:    Medical: Not on file    Non-medical: Not on file  Tobacco Use  . Smoking status: Former Smoker    Packs/day: 0.10    Years: 56.00    Pack years: 5.60    Types: Cigarettes  . Smokeless tobacco: Never Used  Substance and Sexual Activity  . Alcohol use: No    Alcohol/week: 0.0 standard drinks  . Drug use: No  .  Sexual activity: Not on file  Lifestyle  . Physical activity:    Days per week: Not on file    Minutes per session: Not on file  . Stress: Not on file  Relationships  . Social connections:    Talks on phone: Not on file    Gets together: Not on file    Attends religious service: Not on file    Active member of club or organization: Not on file    Attends meetings of clubs or organizations: Not on file    Relationship status: Not on file  . Intimate partner violence:    Fear of current or ex partner: Not on file    Emotionally abused: Not on file    Physically abused: Not on file    Forced sexual activity: Not on file  Other Topics Concern  . Not on file  Social History Narrative  . Not on file   Family History  Problem Relation Age of Onset  . Cancer Mother   . Heart disease Father    Vitals:   04/17/18 1353  BP: (!) 142/88  Pulse: 67  SpO2: 99%  Weight: 95.3 kg (210 lb 3.2 oz)   Wt Readings from Last 3 Encounters:  04/17/18 95.3 kg (210 lb 3.2 oz)  04/12/18 95.2 kg (209 lb 12.8 oz)  04/05/18 93.1 kg (205 lb 3.2 oz)    PHYSICAL EXAM: General: Obese. No resp difficulty. HEENT: Normal Neck: Supple. No JVD. Carotids 2+ bilat; no bruits. No thyromegaly or nodule noted. Cor: PMI nondisplaced. RRR, No M/G/R noted Lungs: CTAB, normal effort. Abdomen: Soft, non-tender,  non-distended, no HSM. No bruits or masses. +BS  Extremities: No cyanosis, clubbing, or rash. R and LLE no edema.  Neuro: Alert & orientedx3, cranial nerves grossly intact. moves all 4 extremities w/o difficulty. Affect pleasant   ASSESSMENT & PLAN:  1. Chronic combined CHF - Suspect etiology is combined Ischemic/NICM with Afib/RVR for nearly a year.   - Echo 10/19/17 LVEF 30-35%, Moderate LVH, Mild AI, Moderate regurgitation, Severe LAE, Moderately reduced RV, Severe RAE, PA peak pressure 50 mm Hg. (Decreased from Echo 01/11/2017 with LVEF 50-55%) - TEE 12/2017: EF 20-25%, RV mod to severely reduced - NYHA II. Volume status stable despite 6 lb weight gain. - Continue lasix 60 mg BID. Recent labs stable.  - Off BB with bradycardia - Increase Entresto to 49/51 mg BID. BMET in 7-10 days.  - Continue spiro 12.5 mg daily.  - Followed by HF paramedicine - Set up repeat echo in 2 months with Dr Haroldine Laws - Discussed possibility of ICD consideration if EF remains < 35%. She is not sure that she would want to go forward with ICD.  2. CAD s/p CABG x 2 2016 (LIMA to LAD and SVG to OM) - No s/s ischemia.  - Continue statin. No ASA. On xarelto. - Continue allopurinol 300 mg daily. Recent study published (CARES trial) that showed an increased risk of death with uloric in patients with CAD. No longer on uloric - Had constant CP last week that occurred after lifting heavy groceries. No associated symptoms and improved with tylenol. It has since resolved.   3. PAF s/p TEE/DCCV on 12/25/17 - CHA2DS2-VASc is at least 5.  - Continue xarelto. Denied for patient assistance. She has plenty of samples at home. Denies bleeding.  - Regular on exam today.  - Off amio with bradycardia   4. HTN - Elevated today. SBP 120-150s during paramedic visits - Increase  Entresto as above.   5. Tobacco Abuse - Stopped smoking 10/2017. Continued cessation. No change.    6. HLD - Per [facility]. No change.   7. Suspected  OSA/Snoring - She had a sleep study last month and was told she needs to come back for another one. Unable to locate study in epic. She cannot afford to pay for another sleep study. No change.   8. Social  - She has poor insight into her disease.  - Followed by HF Paramedicine.   Increase Entresto to 49/51 mg BID Will contact Zach to update and refill her pill box BMET in 7-10 days F/u with APP in 4 weeks Schedule repeat echo in 2 months with Dr Haroldine Laws  Georgiana Shore, NP 04/17/18   Greater than 50% of the 25 minute visit was spent in counseling/coordination of care regarding disease state education, salt/fluid restriction, sliding scale diuretics, and medication compliance.

## 2018-04-17 ENCOUNTER — Telehealth (HOSPITAL_COMMUNITY): Payer: Self-pay

## 2018-04-17 ENCOUNTER — Ambulatory Visit (HOSPITAL_COMMUNITY)
Admission: RE | Admit: 2018-04-17 | Discharge: 2018-04-17 | Disposition: A | Discharge status: Home / Self Care | Payer: PPO | Source: Ambulatory Visit | Attending: Cardiology | Admitting: Cardiology

## 2018-04-17 VITALS — BP 142/88 | HR 67 | Wt 210.2 lb

## 2018-04-17 DIAGNOSIS — N183 Chronic kidney disease, stage 3 (moderate): Secondary | ICD-10-CM | POA: Insufficient documentation

## 2018-04-17 DIAGNOSIS — I10 Essential (primary) hypertension: Secondary | ICD-10-CM

## 2018-04-17 DIAGNOSIS — Z951 Presence of aortocoronary bypass graft: Secondary | ICD-10-CM | POA: Diagnosis not present

## 2018-04-17 DIAGNOSIS — I130 Hypertensive heart and chronic kidney disease with heart failure and stage 1 through stage 4 chronic kidney disease, or unspecified chronic kidney disease: Secondary | ICD-10-CM | POA: Diagnosis not present

## 2018-04-17 DIAGNOSIS — Z87891 Personal history of nicotine dependence: Secondary | ICD-10-CM | POA: Diagnosis not present

## 2018-04-17 DIAGNOSIS — Z7901 Long term (current) use of anticoagulants: Secondary | ICD-10-CM | POA: Insufficient documentation

## 2018-04-17 DIAGNOSIS — I5032 Chronic diastolic (congestive) heart failure: Secondary | ICD-10-CM

## 2018-04-17 DIAGNOSIS — Z79899 Other long term (current) drug therapy: Secondary | ICD-10-CM | POA: Diagnosis not present

## 2018-04-17 DIAGNOSIS — I481 Persistent atrial fibrillation: Secondary | ICD-10-CM | POA: Insufficient documentation

## 2018-04-17 DIAGNOSIS — I25810 Atherosclerosis of coronary artery bypass graft(s) without angina pectoris: Secondary | ICD-10-CM

## 2018-04-17 DIAGNOSIS — M109 Gout, unspecified: Secondary | ICD-10-CM | POA: Diagnosis not present

## 2018-04-17 DIAGNOSIS — E785 Hyperlipidemia, unspecified: Secondary | ICD-10-CM | POA: Insufficient documentation

## 2018-04-17 DIAGNOSIS — I480 Paroxysmal atrial fibrillation: Secondary | ICD-10-CM | POA: Insufficient documentation

## 2018-04-17 DIAGNOSIS — I255 Ischemic cardiomyopathy: Secondary | ICD-10-CM | POA: Insufficient documentation

## 2018-04-17 DIAGNOSIS — I2510 Atherosclerotic heart disease of native coronary artery without angina pectoris: Secondary | ICD-10-CM | POA: Insufficient documentation

## 2018-04-17 DIAGNOSIS — Z885 Allergy status to narcotic agent status: Secondary | ICD-10-CM | POA: Insufficient documentation

## 2018-04-17 DIAGNOSIS — R200 Anesthesia of skin: Secondary | ICD-10-CM | POA: Diagnosis present

## 2018-04-17 DIAGNOSIS — I5042 Chronic combined systolic (congestive) and diastolic (congestive) heart failure: Secondary | ICD-10-CM | POA: Diagnosis not present

## 2018-04-17 MED ORDER — SACUBITRIL-VALSARTAN 49-51 MG PO TABS: 1.0000 | ORAL_TABLET | Freq: Two times a day (BID) | ORAL | 3 refills | Status: DC

## 2018-04-17 NOTE — Telephone Encounter (Signed)
I called [name] [name] to see if she was available for a visit today. She stated she was not home but could meet me tomorrow at 10:00.

## 2018-04-17 NOTE — Patient Instructions (Addendum)
Increase Entresto to 49/51 mg Twice daily, [name] will come by later today and fix your pill box  Labs in 7-10 days  Your physician recommends that you schedule a follow-up appointment in: 4 weeks with APP  Your physician recommends that you schedule a follow-up appointment in: 2 months with echocardiogram

## 2018-04-18 ENCOUNTER — Other Ambulatory Visit (HOSPITAL_COMMUNITY): Payer: Self-pay

## 2018-04-18 NOTE — Progress Notes (Signed)
I went to [name] [name]' house to check her medications and refilled her pillbox. She was not home but left her medications with the man she lives with so that I could complete the task. She had not picked up allopurinol or her new dose of Entresto but when I called the pharmacy they advised that she had been by and the entresto was not ready. I will follow up with her tomorrow to ensure she got her medications.

## 2018-04-24 ENCOUNTER — Encounter: Payer: Self-pay | Admitting: Hematology

## 2018-04-24 ENCOUNTER — Telehealth: Payer: Self-pay | Admitting: Hematology

## 2018-04-24 NOTE — Telephone Encounter (Signed)
New referral received from Dr. Lysle Rubens for low platelets. Pt has been cld and scheduled to see Dr. Irene Limbo on 9/30 at 10am. Pt aware to arrive 30 minutes early. Letter mailed.

## 2018-04-25 ENCOUNTER — Telehealth (HOSPITAL_COMMUNITY): Payer: Self-pay

## 2018-04-26 ENCOUNTER — Other Ambulatory Visit (HOSPITAL_COMMUNITY): Payer: Self-pay

## 2018-04-26 NOTE — Progress Notes (Signed)
Paramedicine Encounter    Patient ID: [name] [name], female    DOB: May 06, 1946, 72 y.o.   MRN: [id number]   Patient Care Team: [name], MD as PCP - General (Internal Medicine) Debara Pickett Nadean Corwin, MD as PCP - Cardiology (Cardiology) Debara Pickett Nadean Corwin, MD as Consulting Physician (Cardiology)  Patient Active Problem List   Diagnosis Date Noted  . Postmenopausal vaginal bleeding 01/04/2018  . Snoring 01/04/2018  . Acute on chronic combined systolic and diastolic CHF (congestive heart failure) ([place])   . CHF exacerbation ([place]) 10/16/2017  . Noncompliance 10/16/2017  . Acute on chronic congestive heart failure ([place])   . Influenza with respiratory manifestation flu a + 09/14/16 started on tamiflu 09/15/17 09/19/2017  . Thrombocytopenia ([place]) 09/14/2017  . Acute drug-induced gout of right foot   . Medication noncompliance due to cognitive impairment 09/06/2017  . Acute diastolic CHF (congestive heart failure) ([place]) 08/06/2017  . LGI bleed, likely hemorrhoids 08/06/2017  . Atrial fibrillation ([place]) 08/04/2017  . Paroxysmal atrial fibrillation ([place]) 02/08/2017  . Cardiomyopathy, ischemic 02/08/2017  . Dysphagia 02/08/2017  . CKD (chronic kidney disease), stage III ([place]) 01/30/2017  . Pain in shoulder 02/08/2016  . Breast pain, left 02/08/2016  . Bilateral arm numbness and tingling while sleeping 02/08/2016  . Painful lumpy left breast 09/23/2015  . [place] intertrigo 02/11/2015  . S/P CABG x 2 11/14/2014  . Accelerated hypertension   . Cardiac arrest ([place]) 11/04/2014  . Left main coronary artery disease 11/04/2014  . Coronary artery disease due to lipid rich plaque   . Acute respiratory failure with hypoxemia ([place])   . Essential hypertension   . Atrial fibrillation with RVR ([place])   . Hypokalemia 10/30/2014  . Chronic diastolic CHF (congestive heart failure) ([place]) 10/30/2014  . NSTEMI (non-ST elevated myocardial infarction) ([place])   . DOE (dyspnea on exertion)   . CAP (community  acquired pneumonia) 10/29/2014    Current Outpatient Medications:  .  acetaminophen (TYLENOL) 500 MG tablet, Take 500 mg by mouth every 6 (six) hours as needed for moderate pain. , Disp: , Rfl:  .  allopurinol (ZYLOPRIM) 300 MG tablet, Take 1 tablet (300 mg total) by mouth daily., Disp: 30 tablet, Rfl: 11 .  atorvastatin (LIPITOR) 10 MG tablet, Take 10 mg by mouth daily., Disp: , Rfl:  .  furosemide (LASIX) 20 MG tablet, TAKE 3 TABLETS (60 MG TOTAL) BY MOUTH 2 (TWO) TIMES DAILY., Disp: 180 tablet, Rfl: 11 .  magnesium oxide (MAG-OX) 400 MG tablet, Take 1 tablet (400 mg total) by mouth 3 (three) times daily., Disp: 270 tablet, Rfl: 3 .  ranitidine (ZANTAC) 150 MG capsule, Take 150 mg by mouth 2 (two) times daily as needed for heartburn. , Disp: , Rfl:  .  rivaroxaban (XARELTO) 20 MG TABS tablet, Take 1 tablet (20 mg total) by mouth daily with supper., Disp: 30 tablet, Rfl: 11 .  sacubitril-valsartan (ENTRESTO) 49-51 MG, Take 1 tablet by mouth 2 (two) times daily., Disp: 60 tablet, Rfl: 3 .  spironolactone (ALDACTONE) 25 MG tablet, Take 0.5 tablets (12.5 mg total) by mouth daily., Disp: 45 tablet, Rfl: 3 .  tizanidine (ZANAFLEX) 2 MG capsule, Take 1 capsule (2 mg total) by mouth at bedtime as needed (back pain)., Disp: 30 capsule, Rfl: 0 .  budesonide (PULMICORT) 0.5 MG/2ML nebulizer solution, Take 2 mLs (0.5 mg total) by nebulization 2 (two) times daily. (Patient not taking: Reported on 03/07/2018), Disp: , Rfl: 12 .  fluticasone (FLONASE) 50 MCG/ACT nasal spray,  Place 1 spray into both nostrils daily. (Patient not taking: Reported on 03/07/2018), Disp: 16 g, Rfl: 2 .  levalbuterol (XOPENEX) 0.63 MG/3ML nebulizer solution, Take 3 mLs (0.63 mg total) by nebulization every 6 (six) hours as needed for wheezing or shortness of breath. (Patient not taking: Reported on 03/07/2018), Disp: 3 mL, Rfl: 12 Allergies  Allergen Reactions  . Bee Venom Anaphylaxis  . Codeine Nausea And Vomiting  . Shrimp  [Shellfish Allergy] Swelling  . Tomato     Pt states it gives her gout      Social History   Socioeconomic History  . Marital status: Widowed    Spouse name: Not on file  . Number of children: Not on file  . Years of education: Not on file  . Highest education level: Not on file  Occupational History  . Not on file  Social Needs  . Financial resource strain: Not on file  . Food insecurity:    Worry: Not on file    Inability: Not on file  . Transportation needs:    Medical: Not on file    Non-medical: Not on file  Tobacco Use  . Smoking status: Former Smoker    Packs/day: 0.10    Years: 56.00    Pack years: 5.60    Types: Cigarettes  . Smokeless tobacco: Never Used  Substance and Sexual Activity  . Alcohol use: No    Alcohol/week: 0.0 standard drinks  . Drug use: No  . Sexual activity: Not on file  Lifestyle  . Physical activity:    Days per week: Not on file    Minutes per session: Not on file  . Stress: Not on file  Relationships  . Social connections:    Talks on phone: Not on file    Gets together: Not on file    Attends religious service: Not on file    Active member of club or organization: Not on file    Attends meetings of clubs or organizations: Not on file    Relationship status: Not on file  . Intimate partner violence:    Fear of current or ex partner: Not on file    Emotionally abused: Not on file    Physically abused: Not on file    Forced sexual activity: Not on file  Other Topics Concern  . Not on file  Social History Narrative  . Not on file    Physical Exam  Constitutional: She is oriented to person, place, and time.  Cardiovascular: Normal rate and regular rhythm.  Pulmonary/Chest: Effort normal and breath sounds normal.  Abdominal: Soft.  Musculoskeletal: Normal range of motion. She exhibits no edema.  Neurological: She is alert and oriented to person, place, and time.  Skin: Skin is warm and dry.  Psychiatric: She has a normal  mood and affect.        Future Appointments  Date Time Provider [place]  04/27/2018  1:30 PM [facility] LAB [facility] None  05/02/2018 12:50 PM [facility] MM 3 [facility]  05/14/2018 10:00 AM Brunetta Genera, MD [facility] None  05/21/2018  1:30 PM [facility] PA/NP [facility] None  06/25/2018  2:00 PM [place] ECHO 1-BUZZ [facility]  06/25/2018  3:00 PM Bensimhon, Shaune Pascal, MD [facility] None    BP 140/78 (BP Location: Left Arm, Patient Position: Sitting, Cuff Size: Large)   Pulse 60   Resp 16   Wt 212 lb (96.2 kg)   SpO2 97%   BMI 31.77 kg/m  Weight yesterday- 211 lb Last visit weight- 210 lb   Ms Fuhr was seen at home today and reported feeling well. She denied SOB, headache, dizziness or orthopnea. She did say she had been having bilateral hand numbness since last week and her chest tightness has started again. I spoke with Doroteo Bradford at the clinic who advised it was not a side effect of any of her medications. She stated that she thought her dose of Entresto was too strong so she began only taking a half tablet in the evening. I explained that Delene Loll should not be broken in half and despite her saying that only taking a half tablet in the evening was making her feel better, she agreed to stop rogue dosing. She was denied Xarelto assistance by Wynetta Emery and [employer] so I spoke to [place] and she advised they need a proof of income and a itemized print out of her medications from the pharmacy since August 15, 2017. I also contacted [facility] who advised she still has an active order for a home nebulizer kit and she can pick it up from any retail store. Her medications were verified and her pillbox was refilled.   [name], EMT 04/26/18  ACTION: Home visit completed Next visit planned for 1 week

## 2018-04-26 NOTE — Telephone Encounter (Signed)
I called [name] [name] to schedule an appointment. She stated she was not available today but asked me to come by tomorrow morning around 10:00.

## 2018-04-27 ENCOUNTER — Telehealth (HOSPITAL_COMMUNITY): Payer: Self-pay

## 2018-04-27 ENCOUNTER — Ambulatory Visit (HOSPITAL_COMMUNITY)
Admission: RE | Admit: 2018-04-27 | Discharge: 2018-04-27 | Disposition: A | Discharge status: Home / Self Care | Payer: PPO | Source: Ambulatory Visit | Attending: Internal Medicine | Admitting: Internal Medicine

## 2018-04-27 DIAGNOSIS — I5032 Chronic diastolic (congestive) heart failure: Secondary | ICD-10-CM

## 2018-04-27 LAB — BASIC METABOLIC PANEL
Anion gap: 11 (ref 5–15)
BUN: 29 mg/dL — ABNORMAL HIGH (ref 8–23)
CO2: 22 mmol/L (ref 22–32)
Calcium: 9.2 mg/dL (ref 8.9–10.3)
Chloride: 106 mmol/L (ref 98–111)
Creatinine, Ser: 1.55 mg/dL — ABNORMAL HIGH (ref 0.44–1.00)
GFR calc Af Amer: 37 mL/min — ABNORMAL LOW (ref >60)
GFR calc non Af Amer: 32 mL/min — ABNORMAL LOW (ref >60)
Glucose, Bld: 117 mg/dL — ABNORMAL HIGH (ref 70–99)
Potassium: 3.9 mmol/L (ref 3.5–5.1)
Sodium: 139 mmol/L (ref 135–145)

## 2018-04-27 NOTE — Telephone Encounter (Signed)
Today contacted [name] [name] regarding her nebulizer's being ready for pick up. [name] [name] advised she would be picking these up today. [name] [name] also advised to complete the following for enrollment into the Xarelto Patient Assistance Program. She was advised to contact her pharmacy in regards to obtaining an itemized list of her medications since January 1st, 2019. [name] [name] was also informed to obtain a tax return form from the IRS for the year of 2018. Patient was understanding and agreeable to same.

## 2018-05-02 ENCOUNTER — Telehealth (HOSPITAL_COMMUNITY): Payer: Self-pay

## 2018-05-02 ENCOUNTER — Ambulatory Visit
Admission: RE | Admit: 2018-05-02 | Discharge: 2018-05-02 | Disposition: A | Discharge status: Home / Self Care | Payer: PPO | Source: Ambulatory Visit | Attending: Internal Medicine | Admitting: Internal Medicine

## 2018-05-02 DIAGNOSIS — Z1231 Encounter for screening mammogram for malignant neoplasm of breast: Secondary | ICD-10-CM

## 2018-05-03 ENCOUNTER — Other Ambulatory Visit (HOSPITAL_COMMUNITY): Payer: Self-pay

## 2018-05-03 NOTE — Progress Notes (Signed)
Paramedicine Encounter    Patient ID: [name] [name], female    DOB: June 04, 1946, 72 y.o.   MRN: [id number]   Patient Care Team: [name], MD as PCP - General (Internal Medicine) Debara Pickett Nadean Corwin, MD as PCP - Cardiology (Cardiology) Debara Pickett Nadean Corwin, MD as Consulting Physician (Cardiology)  Patient Active Problem List   Diagnosis Date Noted  . Postmenopausal vaginal bleeding 01/04/2018  . Snoring 01/04/2018  . Acute on chronic combined systolic and diastolic CHF (congestive heart failure) ([place])   . CHF exacerbation ([place]) 10/16/2017  . Noncompliance 10/16/2017  . Acute on chronic congestive heart failure ([place])   . Influenza with respiratory manifestation flu a + 09/14/16 started on tamiflu 09/15/17 09/19/2017  . Thrombocytopenia ([place]) 09/14/2017  . Acute drug-induced gout of right foot   . Medication noncompliance due to cognitive impairment 09/06/2017  . Acute diastolic CHF (congestive heart failure) ([place]) 08/06/2017  . LGI bleed, likely hemorrhoids 08/06/2017  . Atrial fibrillation ([place]) 08/04/2017  . Paroxysmal atrial fibrillation ([place]) 02/08/2017  . Cardiomyopathy, ischemic 02/08/2017  . Dysphagia 02/08/2017  . CKD (chronic kidney disease), stage III ([place]) 01/30/2017  . Pain in shoulder 02/08/2016  . Breast pain, left 02/08/2016  . Bilateral arm numbness and tingling while sleeping 02/08/2016  . Painful lumpy left breast 09/23/2015  . Candidal intertrigo 02/11/2015  . S/P CABG x 2 11/14/2014  . Accelerated hypertension   . Cardiac arrest ([place]) 11/04/2014  . Left main coronary artery disease 11/04/2014  . Coronary artery disease due to lipid rich plaque   . Acute respiratory failure with hypoxemia ([place])   . Essential hypertension   . Atrial fibrillation with RVR ([place])   . Hypokalemia 10/30/2014  . Chronic diastolic CHF (congestive heart failure) ([place]) 10/30/2014  . NSTEMI (non-ST elevated myocardial infarction) ([place])   . DOE (dyspnea on exertion)   . CAP (community  acquired pneumonia) 10/29/2014    Current Outpatient Medications:  .  acetaminophen (TYLENOL) 500 MG tablet, Take 500 mg by mouth every 6 (six) hours as needed for moderate pain. , Disp: , Rfl:  .  allopurinol (ZYLOPRIM) 300 MG tablet, Take 1 tablet (300 mg total) by mouth daily., Disp: 30 tablet, Rfl: 11 .  atorvastatin (LIPITOR) 10 MG tablet, Take 10 mg by mouth daily., Disp: , Rfl:  .  fluticasone (FLONASE) 50 MCG/ACT nasal spray, Place 1 spray into both nostrils daily., Disp: 16 g, Rfl: 2 .  furosemide (LASIX) 20 MG tablet, TAKE 3 TABLETS (60 MG TOTAL) BY MOUTH 2 (TWO) TIMES DAILY., Disp: 180 tablet, Rfl: 11 .  magnesium oxide (MAG-OX) 400 MG tablet, Take 1 tablet (400 mg total) by mouth 3 (three) times daily., Disp: 270 tablet, Rfl: 3 .  ranitidine (ZANTAC) 150 MG capsule, Take 150 mg by mouth 2 (two) times daily as needed for heartburn. , Disp: , Rfl:  .  rivaroxaban (XARELTO) 20 MG TABS tablet, Take 1 tablet (20 mg total) by mouth daily with supper., Disp: 30 tablet, Rfl: 11 .  sacubitril-valsartan (ENTRESTO) 49-51 MG, Take 1 tablet by mouth 2 (two) times daily., Disp: 60 tablet, Rfl: 3 .  spironolactone (ALDACTONE) 25 MG tablet, Take 0.5 tablets (12.5 mg total) by mouth daily., Disp: 45 tablet, Rfl: 3 .  tizanidine (ZANAFLEX) 2 MG capsule, Take 1 capsule (2 mg total) by mouth at bedtime as needed (back pain)., Disp: 30 capsule, Rfl: 0 .  budesonide (PULMICORT) 0.5 MG/2ML nebulizer solution, Take 2 mLs (0.5 mg total) by nebulization 2 (two)  times daily. (Patient not taking: Reported on 03/07/2018), Disp: , Rfl: 12 .  levalbuterol (XOPENEX) 0.63 MG/3ML nebulizer solution, Take 3 mLs (0.63 mg total) by nebulization every 6 (six) hours as needed for wheezing or shortness of breath. (Patient not taking: Reported on 03/07/2018), Disp: 3 mL, Rfl: 12 Allergies  Allergen Reactions  . Bee Venom Anaphylaxis  . Codeine Nausea And Vomiting  . Shrimp [Shellfish Allergy] Swelling  . Tomato     Pt  states it gives her gout      Social History   Socioeconomic History  . Marital status: Widowed    Spouse name: Not on file  . Number of children: Not on file  . Years of education: Not on file  . Highest education level: Not on file  Occupational History  . Not on file  Social Needs  . Financial resource strain: Not on file  . Food insecurity:    Worry: Not on file    Inability: Not on file  . Transportation needs:    Medical: Not on file    Non-medical: Not on file  Tobacco Use  . Smoking status: Former Smoker    Packs/day: 0.10    Years: 56.00    Pack years: 5.60    Types: Cigarettes  . Smokeless tobacco: Never Used  Substance and Sexual Activity  . Alcohol use: No    Alcohol/week: 0.0 standard drinks  . Drug use: No  . Sexual activity: Not on file  Lifestyle  . Physical activity:    Days per week: Not on file    Minutes per session: Not on file  . Stress: Not on file  Relationships  . Social connections:    Talks on phone: Not on file    Gets together: Not on file    Attends religious service: Not on file    Active member of club or organization: Not on file    Attends meetings of clubs or organizations: Not on file    Relationship status: Not on file  . Intimate partner violence:    Fear of current or ex partner: Not on file    Emotionally abused: Not on file    Physically abused: Not on file    Forced sexual activity: Not on file  Other Topics Concern  . Not on file  Social History Narrative  . Not on file    Physical Exam  Constitutional: She is oriented to person, place, and time.  Cardiovascular: Regular rhythm. Bradycardia present.  Pulmonary/Chest: Effort normal and breath sounds normal.  Abdominal: Soft. Bowel sounds are normal.  Musculoskeletal: Normal range of motion.  Neurological: She is alert and oriented to person, place, and time.  Skin: Skin is warm and dry.  Psychiatric: She has a normal mood and affect.        Future  Appointments  Date Time Provider [place]  05/14/2018 10:00 AM Brunetta Genera, MD [facility] None  05/21/2018  1:30 PM [facility] PA/NP [facility] None  05/22/2018  8:45 AM Hilty, Nadean Corwin, MD [facility]  06/25/2018  2:00 PM [place] ECHO 1-BUZZ [facility]  06/25/2018  3:00 PM Bensimhon, Shaune Pascal, MD [facility] None    BP 134/78 (BP Location: Left Arm, Patient Position: Sitting, Cuff Size: Large)   Pulse (!) 58   Resp 16   Wt 210 lb 12.8 oz (95.6 kg)   SpO2 97%   BMI 31.59 kg/m   Weight yesterday- 212.6 lb Last visit weight- 212 lb  Ms  Mcclish was seen at home today and reported feeling generally well. She continues to complain of intermittent chest discomfort and believes it is because the "Delene Loll is too strong." She has been told on multiple occasions that this is not a side effect of Entresto or any of her other medications. She had picked up her nebulizer machine last week and thought she had the nebulizer solution however what she actually had was Flonase. I called her pharmacy and they did not have a prescription on file for either of the nebulizer medications on her Epic list. I contacted Dr Glenna Durand office and asked if they could send in a new prescription and they advised they would send in Pulmicort. Ms Brazee stated she would pick up the medication tomorrow along with the detailed list of her medication expenses for the year. Her medications were verified and her pillbox was refilled. She was shown how to use her nebulizer machine as well.   [name], EMT 05/03/18  ACTION: Home visit completed Next visit planned for 1 week

## 2018-05-03 NOTE — Telephone Encounter (Signed)
I called [name] [name] to schedule an appointment. She was not home so I left a message for her to call me back when she was able.

## 2018-05-04 ENCOUNTER — Telehealth (HOSPITAL_COMMUNITY): Payer: Self-pay

## 2018-05-04 ENCOUNTER — Other Ambulatory Visit (HOSPITAL_COMMUNITY): Payer: Self-pay

## 2018-05-04 NOTE — Telephone Encounter (Signed)
I called [name] [name] to see if she had picked up her medication expenditure report from the pharmacy. She had not but said she would be going in the next hour or so and would let me know what she got home so I could come pick it up.

## 2018-05-04 NOTE — Progress Notes (Signed)
I saw [name] [name] at her house to pick up her medication expenditure report. While I was there she asked me to show her how to use her new nebulizer machine with the medication she just picked up. She was given a step-by-step demonstration on how the machine should be set up and medication added. She expressed understanding.

## 2018-05-04 NOTE — Telephone Encounter (Signed)
[name] [name] called to let me know she was home and had the necessary paper work for me to pick up. I advised that I would be there shortly and she was agreeable.

## 2018-05-09 ENCOUNTER — Telehealth (HOSPITAL_COMMUNITY): Payer: Self-pay

## 2018-05-09 NOTE — Telephone Encounter (Signed)
I called [name] [name] to schedule an appointment. She was not home but I was able to leave a message for her to call me back.

## 2018-05-10 ENCOUNTER — Telehealth (HOSPITAL_COMMUNITY): Payer: Self-pay

## 2018-05-10 NOTE — Telephone Encounter (Signed)
I called [name] [name] to schedule an appointment. She stated she would be available tomorrow anytime. I told her we could meet her around 12:00 and she agreed.

## 2018-05-11 ENCOUNTER — Other Ambulatory Visit (HOSPITAL_COMMUNITY): Payer: Self-pay

## 2018-05-11 NOTE — Progress Notes (Signed)
Paramedicine Encounter    Patient ID: [name] [name], female    DOB: 07/03/1946, 72 y.o.   MRN: [id number]   Patient Care Team: [name], MD as PCP - General (Internal Medicine) Debara Pickett Nadean Corwin, MD as PCP - Cardiology (Cardiology) Debara Pickett Nadean Corwin, MD as Consulting Physician (Cardiology)  Patient Active Problem List   Diagnosis Date Noted  . Postmenopausal vaginal bleeding 01/04/2018  . Snoring 01/04/2018  . Acute on chronic combined systolic and diastolic CHF (congestive heart failure) ([place])   . CHF exacerbation ([place]) 10/16/2017  . Noncompliance 10/16/2017  . Acute on chronic congestive heart failure ([place])   . Influenza with respiratory manifestation flu a + 09/14/16 started on tamiflu 09/15/17 09/19/2017  . Thrombocytopenia ([place]) 09/14/2017  . Acute drug-induced gout of right foot   . Medication noncompliance due to cognitive impairment 09/06/2017  . Acute diastolic CHF (congestive heart failure) ([place]) 08/06/2017  . LGI bleed, likely hemorrhoids 08/06/2017  . Atrial fibrillation ([place]) 08/04/2017  . Paroxysmal atrial fibrillation ([place]) 02/08/2017  . Cardiomyopathy, ischemic 02/08/2017  . Dysphagia 02/08/2017  . CKD (chronic kidney disease), stage III ([place]) 01/30/2017  . Pain in shoulder 02/08/2016  . Breast pain, left 02/08/2016  . Bilateral arm numbness and tingling while sleeping 02/08/2016  . Painful lumpy left breast 09/23/2015  . Candidal intertrigo 02/11/2015  . S/P CABG x 2 11/14/2014  . Accelerated hypertension   . Cardiac arrest ([place]) 11/04/2014  . Left main coronary artery disease 11/04/2014  . Coronary artery disease due to lipid rich plaque   . Acute respiratory failure with hypoxemia ([place])   . Essential hypertension   . Atrial fibrillation with RVR ([place])   . Hypokalemia 10/30/2014  . Chronic diastolic CHF (congestive heart failure) ([place]) 10/30/2014  . NSTEMI (non-ST elevated myocardial infarction) ([place])   . DOE (dyspnea on exertion)   . CAP (community  acquired pneumonia) 10/29/2014    Current Outpatient Medications:  .  allopurinol (ZYLOPRIM) 300 MG tablet, Take 1 tablet (300 mg total) by mouth daily., Disp: 30 tablet, Rfl: 11 .  atorvastatin (LIPITOR) 10 MG tablet, Take 10 mg by mouth daily., Disp: , Rfl:  .  budesonide (PULMICORT) 0.5 MG/2ML nebulizer solution, Take 2 mLs (0.5 mg total) by nebulization 2 (two) times daily., Disp: , Rfl: 12 .  fluticasone (FLONASE) 50 MCG/ACT nasal spray, Place 1 spray into both nostrils daily., Disp: 16 g, Rfl: 2 .  furosemide (LASIX) 20 MG tablet, TAKE 3 TABLETS (60 MG TOTAL) BY MOUTH 2 (TWO) TIMES DAILY., Disp: 180 tablet, Rfl: 11 .  magnesium oxide (MAG-OX) 400 MG tablet, Take 1 tablet (400 mg total) by mouth 3 (three) times daily., Disp: 270 tablet, Rfl: 3 .  ranitidine (ZANTAC) 150 MG capsule, Take 150 mg by mouth 2 (two) times daily as needed for heartburn. , Disp: , Rfl:  .  rivaroxaban (XARELTO) 20 MG TABS tablet, Take 1 tablet (20 mg total) by mouth daily with supper., Disp: 30 tablet, Rfl: 11 .  sacubitril-valsartan (ENTRESTO) 49-51 MG, Take 1 tablet by mouth 2 (two) times daily., Disp: 60 tablet, Rfl: 3 .  spironolactone (ALDACTONE) 25 MG tablet, Take 0.5 tablets (12.5 mg total) by mouth daily., Disp: 45 tablet, Rfl: 3 .  acetaminophen (TYLENOL) 500 MG tablet, Take 500 mg by mouth every 6 (six) hours as needed for moderate pain. , Disp: , Rfl:  .  levalbuterol (XOPENEX) 0.63 MG/3ML nebulizer solution, Take 3 mLs (0.63 mg total) by nebulization every 6 (six) hours  as needed for wheezing or shortness of breath. (Patient not taking: Reported on 03/07/2018), Disp: 3 mL, Rfl: 12 .  tizanidine (ZANAFLEX) 2 MG capsule, Take 1 capsule (2 mg total) by mouth at bedtime as needed (back pain). (Patient not taking: Reported on 05/11/2018), Disp: 30 capsule, Rfl: 0 Allergies  Allergen Reactions  . Bee Venom Anaphylaxis  . Codeine Nausea And Vomiting  . Shrimp [Shellfish Allergy] Swelling  . Tomato     Pt  states it gives her gout      Social History   Socioeconomic History  . Marital status: Widowed    Spouse name: Not on file  . Number of children: Not on file  . Years of education: Not on file  . Highest education level: Not on file  Occupational History  . Not on file  Social Needs  . Financial resource strain: Not on file  . Food insecurity:    Worry: Not on file    Inability: Not on file  . Transportation needs:    Medical: Not on file    Non-medical: Not on file  Tobacco Use  . Smoking status: Former Smoker    Packs/day: 0.10    Years: 56.00    Pack years: 5.60    Types: Cigarettes  . Smokeless tobacco: Never Used  Substance and Sexual Activity  . Alcohol use: No    Alcohol/week: 0.0 standard drinks  . Drug use: No  . Sexual activity: Not on file  Lifestyle  . Physical activity:    Days per week: Not on file    Minutes per session: Not on file  . Stress: Not on file  Relationships  . Social connections:    Talks on phone: Not on file    Gets together: Not on file    Attends religious service: Not on file    Active member of club or organization: Not on file    Attends meetings of clubs or organizations: Not on file    Relationship status: Not on file  . Intimate partner violence:    Fear of current or ex partner: Not on file    Emotionally abused: Not on file    Physically abused: Not on file    Forced sexual activity: Not on file  Other Topics Concern  . Not on file  Social History Narrative  . Not on file    Physical Exam  Constitutional: She is oriented to person, place, and time.  Cardiovascular: Regular rhythm.  Pulmonary/Chest: Effort normal and breath sounds normal.  Abdominal: Soft.  Musculoskeletal: Normal range of motion. She exhibits no edema.  Neurological: She is alert and oriented to person, place, and time.  Skin: Skin is warm and dry.  Psychiatric: She has a normal mood and affect.        Future Appointments  Date Time  Provider [place]  05/14/2018 10:00 AM Brunetta Genera, MD [facility] None  05/21/2018  1:30 PM [facility] PA/NP [facility] None  05/22/2018  8:45 AM Hilty, Nadean Corwin, MD [facility]  06/25/2018  2:00 PM [place] ECHO 1-BUZZ MC[facility]ECHOL[facility]  06/25/2018  3:00 PM Bensimhon, Shaune Pascal, MD [facility] None    BP (!) 178/90 (BP Location: Left Arm, Patient Position: Sitting, Cuff Size: Large)   Pulse (!) 56   Resp 16   Wt 212 lb 12.8 oz (96.5 kg)   SpO2 98%   BMI 31.89 kg/m   Weight yesterday- 214 lb Last visit weight- 210 lb  Ms Butkiewicz  was seen at home today and reported feeling well. She denied SOB, headache, dizziness or orthopnea. She continues to complain about the same intermittent chest pressure that changes with movement and respiration. She seems convinced that the Delene Loll is to blame despite being assured that it does not have this side effect. She has an appointment at the clinic on Monday and she has been told to bring it up there. She also has an appointment with her cardiologist, Dr Debara Pickett, next week to discuss the same thing. Her medications were verified and her pillbox was refilled. She told me that Ovid Curd,  the PharmD from her PCP's office, wanted to speak with me but she did not have his number. I paged him using the number I found in Epic but did not receive a call back. I assume this is about getting Ms Hilger set up on pill packs, which I agree with. I will follow up next week.   [name], EMT 05/11/18  ACTION: Home visit completed Next visit planned for 1 week

## 2018-05-11 NOTE — Progress Notes (Signed)
HEMATOLOGY/ONCOLOGY CONSULTATION NOTE  Date of Service: 05/14/2018  Patient Care Team: [name], MD as PCP - General (Internal Medicine) Debara Pickett Nadean Corwin, MD as PCP - Cardiology (Cardiology) Debara Pickett Nadean Corwin, MD as Consulting Physician (Cardiology)  CHIEF COMPLAINTS/PURPOSE OF CONSULTATION:  Thrombocytopenia   HISTORY OF PRESENTING ILLNESS:   [name] [name] is a wonderful 72 y.o. female who has been referred to [place] by Dr. [name]  for evaluation and management of Thrombocytopenia. The pt reports that she is doing well overall.   The pt reports that she has never been told about her low platelets and has not previously had any reason to be concerned about her platelets. The pt notes that she has been taking Entresto which she feels has caused some chest tightness that she has discussed with her cardiologist. The pt notes that she did have her bypass surgery 2-3 years ago.   The pt notes that she has not developed any new concerns besides her chest tightness while on Entresto, in the past couple months. She denies any constitutional symptoms. The pt takes allopurinol and Xarelto and denies having any concerns for bleeding whatsoever.  Most recent lab results (04/06/18) of CBC, CMP is as follows: all values are WNL except for RDW at 17.6, PLT at 121k, BUN at 29, Alk Phos at 177.  On review of systems, pt reports weight gain, occasional chest tightness, and denies concerns for bleeding, fevers, chills, night sweats, unexpected weigh loss, pain along the spine, leg swelling, and any other symptoms.   On Social Hx the pt reports that she quit smoking a couple years ago, and denies drinking alcohol and denies any other recreational drug use.  On Family Hx the pt denies any blood disorders.    MEDICAL HISTORY:  Past Medical History:  Diagnosis Date  . Acute respiratory failure ([place]) 08/2017  . Arthritis   . Asthma    ??  . CAD (coronary artery disease)    a. s/p CABG 2016.    . Cardiomyopathy, ischemic    a. EF previously low, improved to LVEF 50-55% as of May 2018  . Chronic combined systolic and diastolic heart failure ([place])   . CKD (chronic kidney disease), stage III ([place])   . Gout   . Hypertension   . Hypokalemia   . LGI bleed 08/06/2017   a. felt to be hemorrhoidal during that admission (no drop in Hgb).  . Persistent atrial fibrillation (HCC)    a. h/o difficult to control rates (complicated by noncompliance), not felt to be a candidate for ablation or antiarrhythmic due to noncompliance.  . Personal history of noncompliance with medical treatment, presenting hazards to health   . S/P CABG x 2 with clipping of LA appendage 11/14/2014   LIMA to LAD, SVG to OM, EVH via right thigh    SURGICAL HISTORY: Past Surgical History:  Procedure Laterality Date  . CARDIOVERSION N/A 11/18/2014   Procedure: CARDIOVERSION;  Surgeon: [name], MD;  Location: [place];  Service: Cardiovascular;  Laterality: N/A;  . CARDIOVERSION N/A 12/25/2017   Procedure: CARDIOVERSION;  Surgeon: [name], MD;  Location: [place];  Service: Cardiovascular;  Laterality: N/A;  . CLIPPING OF ATRIAL APPENDAGE N/A 11/14/2014   Procedure: CLIPPING OF ATRIAL APPENDAGE;  Surgeon: [name], MD;  Location: [place];  Service: Open Heart Surgery;  Laterality: N/A;  . CORONARY ARTERY BYPASS GRAFT N/A 11/14/2014   Procedure: CORONARY ARTERY BYPASS GRAFTING (CABG)TIMES 2 USING LEFT  INTERNAL MAMMARY ARTERY AND RIGHT SAPHENOUS VEIN HARVESTED ENDOSCOPICALLY;  Surgeon: [name], MD;  Location: [place];  Service: Open Heart Surgery;  Laterality: N/A;  . LEFT HEART CATHETERIZATION WITH CORONARY ANGIOGRAM N/A 11/04/2014   Procedure: LEFT HEART CATHETERIZATION WITH CORONARY ANGIOGRAM;  Surgeon: Troy Sine, MD;  Location: [facility] CATH LAB;  Service: Cardiovascular;  Laterality: N/A;  . TEE WITHOUT CARDIOVERSION N/A 11/14/2014   Procedure: TRANSESOPHAGEAL ECHOCARDIOGRAM (TEE);  Surgeon:  [name], MD;  Location: [place];  Service: Open Heart Surgery;  Laterality: N/A;  . TEE WITHOUT CARDIOVERSION N/A 12/25/2017   Procedure: TRANSESOPHAGEAL ECHOCARDIOGRAM (TEE);  Surgeon: [name], MD;  Location[facility] ENDOSCOPY;  Service: Cardiovascular;  Laterality: N/A;  . TEMPORARY PACEMAKER INSERTION  11/04/2014   Procedure: TEMPORARY PACEMAKER INSERTION;  Surgeon: Troy Sine, MD;  Location: [facility] CATH LAB;  Service: Cardiovascular;;    SOCIAL HISTORY: Social History   Socioeconomic History  . Marital status: Widowed    Spouse name: Not on file  . Number of children: Not on file  . Years of education: Not on file  . Highest education level: Not on file  Occupational History  . Not on file  Social Needs  . Financial resource strain: Not on file  . Food insecurity:    Worry: Not on file    Inability: Not on file  . Transportation needs:    Medical: Not on file    Non-medical: Not on file  Tobacco Use  . Smoking status: Former Smoker    Packs/day: 0.10    Years: 56.00    Pack years: 5.60    Types: Cigarettes    Last attempt to quit: 2019    Years since quitting: 0.7  . Smokeless tobacco: Never Used  Substance and Sexual Activity  . Alcohol use: No    Alcohol/week: 0.0 standard drinks  . Drug use: No  . Sexual activity: Not on file  Lifestyle  . Physical activity:    Days per week: Not on file    Minutes per session: Not on file  . Stress: Not on file  Relationships  . Social connections:    Talks on phone: Not on file    Gets together: Not on file    Attends religious service: Not on file    Active member of club or organization: Not on file    Attends meetings of clubs or organizations: Not on file    Relationship status: Not on file  . Intimate partner violence:    Fear of current or ex partner: Not on file    Emotionally abused: Not on file    Physically abused: Not on file    Forced sexual activity: Not on file  Other Topics Concern  . Not  on file  Social History Narrative  . Not on file    FAMILY HISTORY: Family History  Problem Relation Age of Onset  . Cancer Mother   . Heart disease Father     ALLERGIES:  is allergic to bee venom; codeine; shrimp [shellfish allergy]; and tomato.  MEDICATIONS:  Current Outpatient Medications  Medication Sig Dispense Refill  . acetaminophen (TYLENOL) 500 MG tablet Take 500 mg by mouth every 6 (six) hours as needed for moderate pain.     Marland Kitchen allopurinol (ZYLOPRIM) 300 MG tablet Take 1 tablet (300 mg total) by mouth daily. 30 tablet 11  . atorvastatin (LIPITOR) 10 MG tablet Take 10 mg by mouth daily.    Marland Kitchen  budesonide (PULMICORT) 0.5 MG/2ML nebulizer solution Take 2 mLs (0.5 mg total) by nebulization 2 (two) times daily.  12  . fluticasone (FLONASE) 50 MCG/ACT nasal spray Place 1 spray into both nostrils daily. 16 g 2  . furosemide (LASIX) 20 MG tablet TAKE 3 TABLETS (60 MG TOTAL) BY MOUTH 2 (TWO) TIMES DAILY. 180 tablet 11  . levalbuterol (XOPENEX) 0.63 MG/3ML nebulizer solution Take 3 mLs (0.63 mg total) by nebulization every 6 (six) hours as needed for wheezing or shortness of breath. (Patient not taking: Reported on 03/07/2018) 3 mL 12  . magnesium oxide (MAG-OX) 400 MG tablet Take 1 tablet (400 mg total) by mouth 3 (three) times daily. 270 tablet 3  . ranitidine (ZANTAC) 150 MG capsule Take 150 mg by mouth 2 (two) times daily as needed for heartburn.     . rivaroxaban (XARELTO) 20 MG TABS tablet Take 1 tablet (20 mg total) by mouth daily with supper. 30 tablet 11  . sacubitril-valsartan (ENTRESTO) 49-51 MG Take 1 tablet by mouth 2 (two) times daily. 60 tablet 3  . spironolactone (ALDACTONE) 25 MG tablet Take 0.5 tablets (12.5 mg total) by mouth daily. 45 tablet 3  . tizanidine (ZANAFLEX) 2 MG capsule Take 1 capsule (2 mg total) by mouth at bedtime as needed (back pain). (Patient not taking: Reported on 05/11/2018) 30 capsule 0   No current facility-administered medications for this visit.      REVIEW OF SYSTEMS:    10 Point review of Systems was done is negative except as noted above.  PHYSICAL EXAMINATION:  . Vitals:   05/14/18 0959  BP: 127/70  Pulse: 79  Resp: 18  Temp: 97.9 F (36.6 C)  SpO2: 99%   Filed Weights   05/14/18 0959  Weight: 213 lb (96.6 kg)   .Body mass index is 31.92 kg/m.  GENERAL:alert, in no acute distress and comfortable SKIN: no acute rashes, no significant lesions EYES: conjunctiva are pink and non-injected, sclera anicteric OROPHARYNX: MMM, no exudates, no oropharyngeal erythema or ulceration NECK: supple, no JVD LYMPH:  no palpable lymphadenopathy in the cervical, axillary or inguinal regions LUNGS: clear to auscultation b/l with normal respiratory effort HEART: regular rate & rhythm ABDOMEN:  normoactive bowel sounds , non tender, not distended. No palpable hepatosplenomegaly.  Extremity: no pedal edema PSYCH: alert & oriented x 3 with fluent speech NEURO: no focal motor/sensory deficits  LABORATORY DATA:  I have reviewed the data as listed  . CBC Latest Ref Rng & Units 10/20/2017 10/19/2017 10/18/2017  WBC 4.0 - 10.5 K/uL 11.9(H) 10.2 7.9  Hemoglobin 12.0 - 15.0 g/dL 14.3 14.5 14.5  Hematocrit 36.0 - 46.0 % 44.0 44.5 44.8  Platelets 150 - 400 K/uL 214 217 186    . CMP Latest Ref Rng & Units 04/27/2018 03/15/2018 03/07/2018  Glucose 70 - 99 mg/dL 117(H) 112(H) 100(H)  BUN 8 - 23 mg/dL 29(H) 24(H) 20  Creatinine 0.44 - 1.00 mg/dL 1.55(H) 1.25(H) 1.26(H)  Sodium 135 - 145 mmol/L 139 145 144  Potassium 3.5 - 5.1 mmol/L 3.9 3.8 3.5  Chloride 98 - 111 mmol/L 106 110 110  CO2 22 - 32 mmol/L '22 25 26  ' Calcium 8.9 - 10.3 mg/dL 9.2 9.4 9.1  Total Protein 6.5 - 8.1 g/dL - - -  Total Bilirubin 0.3 - 1.2 mg/dL - - -  Alkaline Phos 38 - 126 U/L - - -  AST 15 - 41 U/L - - -  ALT 14 - 54 U/L - - -  04/06/18 CBC:    RADIOGRAPHIC STUDIES: I have personally reviewed the radiological images as listed and agreed with the findings in  the report. Mm 3d Screen Breast Bilateral  Result Date: 05/02/2018 CLINICAL DATA:  Screening. EXAM: DIGITAL SCREENING BILATERAL MAMMOGRAM WITH TOMO AND CAD COMPARISON:  Previous exam(s). ACR Breast Density Category c: The breast tissue is heterogeneously dense, which may obscure small masses. FINDINGS: There are no findings suspicious for malignancy. Images were processed with CAD. IMPRESSION: No mammographic evidence of malignancy. A result letter of this screening mammogram will be mailed directly to the patient. RECOMMENDATION: Screening mammogram in one year. (Code:SM-B-01Y) BI-RADS CATEGORY  1: Negative. Electronically Signed   By: Dorise Bullion III M.D   On: 05/02/2018 14:44    ASSESSMENT & PLAN:  72 y.o. female with  1. Thrombocytopenia -Discussed patient's most recent labs from 04/06/18, WBC are WNL at 6.8k, HGB normal at 14.6, PLT borderline low at 121k -Review of her CBCs from the last three years show PLT fluctuating, and lowest count was seen in April 2016 at [facility], and as high as 214k on 10/20/17 -Discussed that the pattern of her platelet fluctuation over the last three years is most suggestive of a medication effect -Discussed that if her PLT continue to remain above 50k, she should not have any issues with bleeding - her medications can cause some borderline low platelets, as can a fatty liver which was observed on a 11/17/16 -If PLT consistently drop beneath 80k, would recommend further work up -Recommend that platelet counts be monitored but I would not suggest that her thrombocytopenia is prohibitive from continuing her medications at this time -Will see the pt back as needed   2.  Patient Active Problem List   Diagnosis Date Noted  . Postmenopausal vaginal bleeding 01/04/2018  . Snoring 01/04/2018  . Acute on chronic combined systolic and diastolic CHF (congestive heart failure) ([place])   . CHF exacerbation ([place]) 10/16/2017  . Noncompliance 10/16/2017  . Acute on chronic  congestive heart failure ([place])   . Influenza with respiratory manifestation flu a + 09/14/16 started on tamiflu 09/15/17 09/19/2017  . Thrombocytopenia ([place]) 09/14/2017  . Acute drug-induced gout of right foot   . Medication noncompliance due to cognitive impairment 09/06/2017  . Acute diastolic CHF (congestive heart failure) ([place]) 08/06/2017  . LGI bleed, likely hemorrhoids 08/06/2017  . Atrial fibrillation ([place]) 08/04/2017  . Paroxysmal atrial fibrillation ([place]) 02/08/2017  . Cardiomyopathy, ischemic 02/08/2017  . Dysphagia 02/08/2017  . CKD (chronic kidney disease), stage III ([place]) 01/30/2017  . Pain in shoulder 02/08/2016  . Breast pain, left 02/08/2016  . Bilateral arm numbness and tingling while sleeping 02/08/2016  . Painful lumpy left breast 09/23/2015  . [place]idal intertrigo 02/11/2015  . S/P CABG x 2 11/14/2014  . Accelerated hypertension   . Cardiac arrest ([place]) 11/04/2014  . Left main coronary artery disease 11/04/2014  . Coronary artery disease due to lipid rich plaque   . Acute respiratory failure with hypoxemia ([place])   . Essential hypertension   . Atrial fibrillation with RVR ([place])   . Hypokalemia 10/30/2014  . Chronic diastolic CHF (congestive heart failure) ([place]) 10/30/2014  . NSTEMI (non-ST elevated myocardial infarction) ([place])   . DOE (dyspnea on exertion)   . CAP (community acquired pneumonia) 10/29/2014   -continue f/u with PCP RTC with Dr Irene Limbo as needed  All of the patients questions were answered with apparent satisfaction. The patient knows to call the clinic with any problems, questions  or concerns.  The total time spent in the appt was 40 minutes and more than 50% was on counseling and direct patient cares.    Sullivan Lone MD MS AAHIVMS [facility] Hematology/Oncology Physician [facility]  (Office):       [phone] (Work cell):  [phone] (Fax):           [phone]  05/14/2018 10:53 AM  I, Baldwin [place], am acting as a scribe  for Dr. Irene Limbo  .I have reviewed the above documentation for accuracy and completeness, and I agree with the above. Brunetta Genera MD

## 2018-05-14 ENCOUNTER — Encounter: Payer: Self-pay | Admitting: Hematology

## 2018-05-14 ENCOUNTER — Inpatient Hospital Stay: Payer: PPO | Attending: Hematology | Admitting: Hematology

## 2018-05-14 VITALS — BP 127/70 | HR 79 | Temp 97.9°F | Resp 18 | Ht 68.5 in | Wt 213.0 lb

## 2018-05-14 DIAGNOSIS — Z87891 Personal history of nicotine dependence: Secondary | ICD-10-CM

## 2018-05-14 DIAGNOSIS — Z79899 Other long term (current) drug therapy: Secondary | ICD-10-CM | POA: Diagnosis not present

## 2018-05-14 DIAGNOSIS — I5020 Unspecified systolic (congestive) heart failure: Secondary | ICD-10-CM | POA: Diagnosis not present

## 2018-05-14 DIAGNOSIS — I4891 Unspecified atrial fibrillation: Secondary | ICD-10-CM | POA: Diagnosis not present

## 2018-05-14 DIAGNOSIS — I10 Essential (primary) hypertension: Secondary | ICD-10-CM | POA: Diagnosis not present

## 2018-05-14 DIAGNOSIS — I25810 Atherosclerosis of coronary artery bypass graft(s) without angina pectoris: Secondary | ICD-10-CM | POA: Diagnosis not present

## 2018-05-14 DIAGNOSIS — N183 Chronic kidney disease, stage 3 (moderate): Secondary | ICD-10-CM | POA: Diagnosis not present

## 2018-05-14 DIAGNOSIS — Z951 Presence of aortocoronary bypass graft: Secondary | ICD-10-CM | POA: Diagnosis not present

## 2018-05-14 DIAGNOSIS — J449 Chronic obstructive pulmonary disease, unspecified: Secondary | ICD-10-CM | POA: Diagnosis not present

## 2018-05-14 DIAGNOSIS — D696 Thrombocytopenia, unspecified: Secondary | ICD-10-CM

## 2018-05-14 DIAGNOSIS — I214 Non-ST elevation (NSTEMI) myocardial infarction: Secondary | ICD-10-CM | POA: Diagnosis not present

## 2018-05-14 DIAGNOSIS — M1990 Unspecified osteoarthritis, unspecified site: Secondary | ICD-10-CM | POA: Diagnosis not present

## 2018-05-18 ENCOUNTER — Other Ambulatory Visit (HOSPITAL_COMMUNITY): Payer: Self-pay

## 2018-05-18 ENCOUNTER — Telehealth (HOSPITAL_COMMUNITY): Payer: Self-pay

## 2018-05-18 NOTE — Telephone Encounter (Signed)
I called [name] [name] to let her know I was able to obtain samples from the heart failure clinic and would be coming by to finish filling her pillbox. She stated she was not home but her house mate would be there to let me in.

## 2018-05-18 NOTE — Progress Notes (Signed)
Paramedicine Encounter    Patient ID: [name] [name], female    DOB: 1945-12-15, 72 y.o.   MRN: [id number]   Patient Care Team: [name], MD as PCP - General (Internal Medicine) Debara Pickett Nadean Corwin, MD as PCP - Cardiology (Cardiology) Debara Pickett Nadean Corwin, MD as Consulting Physician (Cardiology)  Patient Active Problem List   Diagnosis Date Noted  . Postmenopausal vaginal bleeding 01/04/2018  . Snoring 01/04/2018  . Acute on chronic combined systolic and diastolic CHF (congestive heart failure) ([place])   . CHF exacerbation ([place]) 10/16/2017  . Noncompliance 10/16/2017  . Acute on chronic congestive heart failure ([place])   . Influenza with respiratory manifestation flu a + 09/14/16 started on tamiflu 09/15/17 09/19/2017  . Thrombocytopenia ([place]) 09/14/2017  . Acute drug-induced gout of right foot   . Medication noncompliance due to cognitive impairment 09/06/2017  . Acute diastolic CHF (congestive heart failure) ([place]) 08/06/2017  . LGI bleed, likely hemorrhoids 08/06/2017  . Atrial fibrillation ([place]) 08/04/2017  . Paroxysmal atrial fibrillation ([place]) 02/08/2017  . Cardiomyopathy, ischemic 02/08/2017  . Dysphagia 02/08/2017  . CKD (chronic kidney disease), stage III ([place]) 01/30/2017  . Pain in shoulder 02/08/2016  . Breast pain, left 02/08/2016  . Bilateral arm numbness and tingling while sleeping 02/08/2016  . Painful lumpy left breast 09/23/2015  . Candidal intertrigo 02/11/2015  . S/P CABG x 2 11/14/2014  . Accelerated hypertension   . Cardiac arrest ([place]) 11/04/2014  . Left main coronary artery disease 11/04/2014  . Coronary artery disease due to lipid rich plaque   . Acute respiratory failure with hypoxemia ([place])   . Essential hypertension   . Atrial fibrillation with RVR ([place])   . Hypokalemia 10/30/2014  . Chronic diastolic CHF (congestive heart failure) ([place]) 10/30/2014  . NSTEMI (non-ST elevated myocardial infarction) ([place])   . [name] (dyspnea on exertion)   . CAP (community  acquired pneumonia) 10/29/2014    Current Outpatient Medications:  .  acetaminophen (TYLENOL) 500 MG tablet, Take 500 mg by mouth every 6 (six) hours as needed for moderate pain. , Disp: , Rfl:  .  allopurinol (ZYLOPRIM) 300 MG tablet, Take 1 tablet (300 mg total) by mouth daily., Disp: 30 tablet, Rfl: 11 .  atorvastatin (LIPITOR) 10 MG tablet, Take 10 mg by mouth daily., Disp: , Rfl:  .  budesonide (PULMICORT) 0.5 MG/2ML nebulizer solution, Take 2 mLs (0.5 mg total) by nebulization 2 (two) times daily., Disp: , Rfl: 12 .  fluticasone (FLONASE) 50 MCG/ACT nasal spray, Place 1 spray into both nostrils daily., Disp: 16 g, Rfl: 2 .  furosemide (LASIX) 20 MG tablet, TAKE 3 TABLETS (60 MG TOTAL) BY MOUTH 2 (TWO) TIMES DAILY., Disp: 180 tablet, Rfl: 11 .  magnesium oxide (MAG-OX) 400 MG tablet, Take 1 tablet (400 mg total) by mouth 3 (three) times daily., Disp: 270 tablet, Rfl: 3 .  ranitidine (ZANTAC) 150 MG capsule, Take 150 mg by mouth 2 (two) times daily as needed for heartburn. , Disp: , Rfl:  .  rivaroxaban (XARELTO) 20 MG TABS tablet, Take 1 tablet (20 mg total) by mouth daily with supper., Disp: 30 tablet, Rfl: 11 .  sacubitril-valsartan (ENTRESTO) 49-51 MG, Take 1 tablet by mouth 2 (two) times daily., Disp: 60 tablet, Rfl: 3 .  spironolactone (ALDACTONE) 25 MG tablet, Take 0.5 tablets (12.5 mg total) by mouth daily., Disp: 45 tablet, Rfl: 3 .  levalbuterol (XOPENEX) 0.63 MG/3ML nebulizer solution, Take 3 mLs (0.63 mg total) by nebulization every 6 (six) hours  as needed for wheezing or shortness of breath. (Patient not taking: Reported on 03/07/2018), Disp: 3 mL, Rfl: 12 .  tizanidine (ZANAFLEX) 2 MG capsule, Take 1 capsule (2 mg total) by mouth at bedtime as needed (back pain). (Patient not taking: Reported on 05/11/2018), Disp: 30 capsule, Rfl: 0 Allergies  Allergen Reactions  . Bee Venom Anaphylaxis  . Codeine Nausea And Vomiting  . Shrimp [Shellfish Allergy] Swelling  . Tomato     Pt  states it gives her gout      Social History   Socioeconomic History  . Marital status: Widowed    Spouse name: Not on file  . Number of children: Not on file  . Years of education: Not on file  . Highest education level: Not on file  Occupational History  . Not on file  Social Needs  . Financial resource strain: Not on file  . Food insecurity:    Worry: Not on file    Inability: Not on file  . Transportation needs:    Medical: Not on file    Non-medical: Not on file  Tobacco Use  . Smoking status: Former Smoker    Packs/day: 0.10    Years: 56.00    Pack years: 5.60    Types: Cigarettes    Last attempt to quit: 2019    Years since quitting: 0.7  . Smokeless tobacco: Never Used  Substance and Sexual Activity  . Alcohol use: No    Alcohol/week: 0.0 standard drinks  . Drug use: No  . Sexual activity: Not on file  Lifestyle  . Physical activity:    Days per week: Not on file    Minutes per session: Not on file  . Stress: Not on file  Relationships  . Social connections:    Talks on phone: Not on file    Gets together: Not on file    Attends religious service: Not on file    Active member of club or organization: Not on file    Attends meetings of clubs or organizations: Not on file    Relationship status: Not on file  . Intimate partner violence:    Fear of current or ex partner: Not on file    Emotionally abused: Not on file    Physically abused: Not on file    Forced sexual activity: Not on file  Other Topics Concern  . Not on file  Social History Narrative  . Not on file    Physical Exam  Constitutional: She is oriented to person, place, and time.  Cardiovascular: Normal rate and regular rhythm.  Pulmonary/Chest: Effort normal and breath sounds normal.  Musculoskeletal: Normal range of motion. She exhibits edema.  Neurological: She is alert and oriented to person, place, and time.  Skin: Skin is warm and dry.  Psychiatric: She has a normal mood and  affect.        Future Appointments  Date Time Provider [place]  05/21/2018  1:30 PM [facility] PA/NP [facility] None  05/22/2018  8:45 AM Hilty, Nadean Corwin, MD [facility]  06/25/2018  2:00 PM [place] ECHO 1-BUZZ [facility]  06/25/2018  3:00 PM Bensimhon, Shaune Pascal, MD [facility] None    BP 122/78 (BP Location: Left Arm, Patient Position: Sitting, Cuff Size: Large)   Pulse 80   Resp 16   Wt 214 lb 12.8 oz (97.4 kg)   SpO2 97%   BMI 32.19 kg/m   Weight yesterday- Did not weigh Last visit weight- 212 lb  [name] [name] was seen at home today and reported feeling well. She stated the chest pressure which she has been having intermittently, has been less frequent over the past week. She denied SOB, headache, dizziness or orthopnea. She has been compliant with her medications over the past week and her weight has been stable. She is currently out of Xarelto, entresto and magnesium. I attempted to call [name], from Dr Glenna Durand office,  multiple times, as he advised me yesterday that he is ready to begin supplying medications for [name] [name] through a [facility] pilot program. I was unable to reach him so I will reach out to the clinic to obtain samples for the next week. Her medications were verified and her pillbox was refilled with the medications available.   [name] [name], EMT 05/18/18  ACTION: Home visit completed Next visit planned for 1 week

## 2018-05-18 NOTE — Progress Notes (Signed)
I went to [name] [name]' house to finish filling her pillbox with the sample medications I obtained from the HF clinic. She still does not have Magnesium in her Wednesday afternoon through Thursday evening slots but I am hopeful to have that rectified upon speaking with [name] next week.

## 2018-05-20 NOTE — Progress Notes (Signed)
[facility]  Note   PCP: [name], MD PCP-Cardiologist: [name], MD  HF: Dr Haroldine Laws   HPI: [name] [name] is a 72 y.o. female with h/o HTN, Tobacco abuse, PAF, CAD s/p CABG x 2 (LIMA to LAD and SVG to OM) in 2016, Chronic combined CHF, Ischemic CMP, HLD, and DOE.   She was initially noted to be in atrial fibrillation in 2016 with a reduced LVEF, leading to ischemic workup with a cardiac cath showing severe multivessel CAD with LM involvement in which she underwent CABG x2 utilizing LIMA to LAD, SVG to OM.   Admitted 1/19 for AF with RVR in setting of Influenza A. During that hospitalization, she was started on diltiazem drip, EP was consulted to discuss plans for Tikosyn vs. ablation vs. medication rate control however, due to her known medication noncompliance she was found to not be an ablation nor antiarrhythmic candidate  Pt admitted 3/3 - 10/20/17 with recurrent Afib RVR and ADHF. Started on diltiazem. Echo repeated which showed fall in EF from previous. Diuresed with IV lasix with 5L negative for admission. Discharged home on diltiazem 360, Torpol 100, digoxin and Xarelto.  Seen by Dr. Debara Pickett 11/20/17, was feeling well overall. Of note, she reported not remembering her recent hospitalization. She was also noted to be out of her metoprolol for approximately a month.   She was found to be in Afib. She was started on amiodarone and diltiazem was DCd. She was also started on Entresto. She underwent successful atrial flutter TEE/DCCV on 12/25/17. She was back in rate controlled afib on 5/16 during paramedic visit.  In May Toprol XL was cut back to 50 mg per day due to bradycardia. Toprol DC'd with ongoing bradycardia.   Today she returns for HF follow up. Last visit entresto was increased to 49-51 mg twice a day and has not felt good since that time. Overall feeling ok.  Having intermittent dizziness that she associates with entresto. Able to walk in stores without  difficulty. Denies PND/Orthopnea. No bleeding problems. Appetite ok. No fever or chills. Weight at home has been stable. Taking all medications. She is not smoking.  Echo in 3/16 EF 35-40%. Echo in 10/17 EF improved to 50-55%.  F/u echo 5/18 EF stable 50-55% Echo 10/19/17 LVEF 30-35%, Moderate LVH, Mild AI, Moderate regurgitation, Severe LAE, Moderately reduced RV, Severe RAE, PA peak pressure 50 mm Hg.   (Decreased from Echo 01/11/2017 with LVEF 50-55%)  Review of systems complete and found to be negative unless listed in HPI.   Past Medical History:  Diagnosis Date  . Acute respiratory failure ([place]) 08/2017  . Arthritis   . Asthma    ??  . CAD (coronary artery disease)    a. s/p CABG 2016.  . Cardiomyopathy, ischemic    a. EF previously low, improved to LVEF 50-55% as of May 2018  . Chronic combined systolic and diastolic heart failure ([place])   . CKD (chronic kidney disease), stage III ([place])   . Gout   . Hypertension   . Hypokalemia   . LGI bleed 08/06/2017   a. felt to be hemorrhoidal during that admission (no drop in Hgb).  . Persistent atrial fibrillation ([facility])    a. h/o difficult to control rates (complicated by noncompliance), not felt to be a candidate for ablation or antiarrhythmic due to noncompliance.  . Personal history of noncompliance with medical treatment, presenting hazards to health   . S/P CABG x 2 with  clipping of LA appendage 11/14/2014   LIMA to LAD, SVG to OM, EVH via right thigh   Current Outpatient Medications  Medication Sig Dispense Refill  . acetaminophen (TYLENOL) 500 MG tablet Take 500 mg by mouth every 6 (six) hours as needed for moderate pain.     Marland Kitchen allopurinol (ZYLOPRIM) 300 MG tablet Take 1 tablet (300 mg total) by mouth daily. 30 tablet 11  . atorvastatin (LIPITOR) 10 MG tablet Take 10 mg by mouth daily.    . fluticasone (FLONASE) 50 MCG/ACT nasal spray Place 1 spray into both nostrils daily. 16 g 2  . furosemide (LASIX) 20 MG tablet TAKE 3 TABLETS  (60 MG TOTAL) BY MOUTH 2 (TWO) TIMES DAILY. 180 tablet 11  . magnesium oxide (MAG-OX) 400 MG tablet Take 1 tablet (400 mg total) by mouth 3 (three) times daily. 270 tablet 3  . ranitidine (ZANTAC) 150 MG capsule Take 150 mg by mouth 2 (two) times daily as needed for heartburn.     . rivaroxaban (XARELTO) 20 MG TABS tablet Take 1 tablet (20 mg total) by mouth daily with supper. 30 tablet 11  . sacubitril-valsartan (ENTRESTO) 49-51 MG Take 1 tablet by mouth 2 (two) times daily. 60 tablet 3  . spironolactone (ALDACTONE) 25 MG tablet Take 0.5 tablets (12.5 mg total) by mouth daily. 45 tablet 3  . tizanidine (ZANAFLEX) 2 MG capsule Take 1 capsule (2 mg total) by mouth at bedtime as needed (back pain). 30 capsule 0  . budesonide (PULMICORT) 0.5 MG/2ML nebulizer solution Take 2 mLs (0.5 mg total) by nebulization 2 (two) times daily.  12  . levalbuterol (XOPENEX) 0.63 MG/3ML nebulizer solution Take 3 mLs (0.63 mg total) by nebulization every 6 (six) hours as needed for wheezing or shortness of breath. (Patient not taking: Reported on 03/07/2018) 3 mL 12   No current facility-administered medications for this encounter.    Allergies  Allergen Reactions  . Bee Venom Anaphylaxis  . Codeine Nausea And Vomiting  . Shrimp [Shellfish Allergy] Swelling  . Tomato     Pt states it gives her gout   Social History   Socioeconomic History  . Marital status: Widowed    Spouse name: Not on file  . Number of children: Not on file  . Years of education: Not on file  . Highest education level: Not on file  Occupational History  . Not on file  Social Needs  . Financial resource strain: Not on file  . Food insecurity:    Worry: Not on file    Inability: Not on file  . Transportation needs:    Medical: Not on file    Non-medical: Not on file  Tobacco Use  . Smoking status: Former Smoker    Packs/day: 0.10    Years: 56.00    Pack years: 5.60    Types: Cigarettes    Last attempt to quit: 2019    Years  since quitting: 0.7  . Smokeless tobacco: Never Used  Substance and Sexual Activity  . Alcohol use: No    Alcohol/week: 0.0 standard drinks  . Drug use: No  . Sexual activity: Not on file  Lifestyle  . Physical activity:    Days per week: Not on file    Minutes per session: Not on file  . Stress: Not on file  Relationships  . Social connections:    Talks on phone: Not on file    Gets together: Not on file    Attends religious service:  Not on file    Active member of club or organization: Not on file    Attends meetings of clubs or organizations: Not on file    Relationship status: Not on file  . Intimate partner violence:    Fear of current or ex partner: Not on file    Emotionally abused: Not on file    Physically abused: Not on file    Forced sexual activity: Not on file  Other Topics Concern  . Not on file  Social History Narrative  . Not on file   Family History  Problem Relation Age of Onset  . Cancer Mother   . Heart disease Father    Vitals:   05/21/18 1340  BP: (!) 144/76  Pulse: 61  SpO2: 98%  Weight: 97.9 kg (215 lb 12.8 oz)   Wt Readings from Last 3 Encounters:  05/21/18 97.9 kg (215 lb 12.8 oz)  05/18/18 97.4 kg (214 lb 12.8 oz)  05/14/18 96.6 kg (213 lb)    Reds Clip 33%.  Sitting `144/76 Standing 140/70   PHYSICAL EXAM: General:  Well appearing. No resp difficulty. Walked in the clinic without difficulty.  HEENT: normal Neck: supple. JVP 5-6 . Carotids 2+ bilat; no bruits. No lymphadenopathy or thryomegaly appreciated. Cor: PMI nondisplaced. Regular rate & rhythm. No rubs, gallops or murmurs. Lungs: clear Abdomen: soft, nontender, nondistended. No hepatosplenomegaly. No bruits or masses. Good bowel sounds. Extremities: no cyanosis, clubbing, rash, edema Neuro: alert & orientedx3, cranial nerves grossly intact. moves all 4 extremities w/o difficulty. Affect pleasant    ASSESSMENT & PLAN:  1. Chronic combined CHF - Suspect etiology is  combined Ischemic/NICM with Afib/RVR for nearly a year.   - Echo 10/19/17 LVEF 30-35%, Moderate LVH, Mild AI, Moderate regurgitation, Severe LAE, Moderately reduced RV, Severe RAE, PA peak pressure 50 mm Hg. (Decreased from Echo 01/11/2017 with LVEF 50-55%) - TEE 12/2017: EF 20-25%, RV mod to severely reduced - NYHA II. Volume status stable. Continue lasix 60 mg twice a day.  - Off BB with bradycardia -Cut back entresto to 24-26 twice a day per patient request.    - Continue spiro 12.5 mg daily.  - Discussed possibility of ICD consideration if EF remains < 35%. She is not sure that she would want to go forward with ICD. 2. CAD s/p CABG x 2 2016 (LIMA to LAD and SVG to OM) - Has had some chest pain. I offered to set up Stress Test but she wants to hold off.  - Continue statin. No ASA. On xarelto. - Continue allopurinol 300 mg daily. Recent study published (CARES trial) that showed an increased risk of death with uloric in patients with CAD. No longer on uloric 3. PAF s/p TEE/DCCV on 12/25/17 -Regular pulse..  - CHA2DS2-VASc is at least 5.  - Continue xarelto.  - Off amio with bradycardia  4. HTN -Ok  5. Tobacco Abuse - Stopped smoking 10/2017. 6. HLD - Per [facility]. No change.  7. Suspected OSA/Snoring - She had a sleep study last month and was told she needs to come back for another one. -  Unable to locate study in epic. She cannot afford to pay for another sleep study. No change.   8. Social  - She has poor insight into her disease.  - Followed by HF Paramedicine.   Follow up in 2 weeks. May need to stop entresto if she has ongoing dizziness or consider cutting back lasix. She si not orthostatic. Continue Paramedicine.  [name], NP 05/21/18

## 2018-05-21 ENCOUNTER — Ambulatory Visit (HOSPITAL_COMMUNITY)
Admission: RE | Admit: 2018-05-21 | Discharge: 2018-05-21 | Disposition: A | Discharge status: Home / Self Care | Payer: PPO | Source: Ambulatory Visit | Attending: Internal Medicine | Admitting: Internal Medicine

## 2018-05-21 ENCOUNTER — Encounter (HOSPITAL_COMMUNITY): Payer: Self-pay

## 2018-05-21 VITALS — BP 144/76 | HR 61 | Wt 215.8 lb

## 2018-05-21 DIAGNOSIS — I25810 Atherosclerosis of coronary artery bypass graft(s) without angina pectoris: Secondary | ICD-10-CM

## 2018-05-21 DIAGNOSIS — I255 Ischemic cardiomyopathy: Secondary | ICD-10-CM | POA: Insufficient documentation

## 2018-05-21 DIAGNOSIS — I5032 Chronic diastolic (congestive) heart failure: Secondary | ICD-10-CM

## 2018-05-21 DIAGNOSIS — R0683 Snoring: Secondary | ICD-10-CM | POA: Insufficient documentation

## 2018-05-21 DIAGNOSIS — N183 Chronic kidney disease, stage 3 (moderate): Secondary | ICD-10-CM | POA: Diagnosis not present

## 2018-05-21 DIAGNOSIS — Z7901 Long term (current) use of anticoagulants: Secondary | ICD-10-CM | POA: Insufficient documentation

## 2018-05-21 DIAGNOSIS — Z951 Presence of aortocoronary bypass graft: Secondary | ICD-10-CM | POA: Diagnosis not present

## 2018-05-21 DIAGNOSIS — I480 Paroxysmal atrial fibrillation: Secondary | ICD-10-CM | POA: Insufficient documentation

## 2018-05-21 DIAGNOSIS — I5022 Chronic systolic (congestive) heart failure: Secondary | ICD-10-CM

## 2018-05-21 DIAGNOSIS — M109 Gout, unspecified: Secondary | ICD-10-CM | POA: Diagnosis not present

## 2018-05-21 DIAGNOSIS — I5042 Chronic combined systolic (congestive) and diastolic (congestive) heart failure: Secondary | ICD-10-CM | POA: Diagnosis not present

## 2018-05-21 DIAGNOSIS — Z87891 Personal history of nicotine dependence: Secondary | ICD-10-CM | POA: Diagnosis not present

## 2018-05-21 DIAGNOSIS — I2510 Atherosclerotic heart disease of native coronary artery without angina pectoris: Secondary | ICD-10-CM | POA: Diagnosis not present

## 2018-05-21 DIAGNOSIS — I10 Essential (primary) hypertension: Secondary | ICD-10-CM

## 2018-05-21 DIAGNOSIS — E785 Hyperlipidemia, unspecified: Secondary | ICD-10-CM | POA: Insufficient documentation

## 2018-05-21 DIAGNOSIS — I130 Hypertensive heart and chronic kidney disease with heart failure and stage 1 through stage 4 chronic kidney disease, or unspecified chronic kidney disease: Secondary | ICD-10-CM | POA: Insufficient documentation

## 2018-05-21 LAB — BASIC METABOLIC PANEL
Anion gap: 8 (ref 5–15)
BUN: 30 mg/dL — ABNORMAL HIGH (ref 8–23)
CO2: 22 mmol/L (ref 22–32)
Calcium: 8.9 mg/dL (ref 8.9–10.3)
Chloride: 109 mmol/L (ref 98–111)
Creatinine, Ser: 1.46 mg/dL — ABNORMAL HIGH (ref 0.44–1.00)
GFR calc Af Amer: 40 mL/min — ABNORMAL LOW (ref >60)
GFR calc non Af Amer: 34 mL/min — ABNORMAL LOW (ref >60)
Glucose, Bld: 101 mg/dL — ABNORMAL HIGH (ref 70–99)
Potassium: 3.6 mmol/L (ref 3.5–5.1)
Sodium: 139 mmol/L (ref 135–145)

## 2018-05-21 LAB — BRAIN NATRIURETIC PEPTIDE: B Natriuretic Peptide: 57.2 pg/mL (ref 0.0–100.0)

## 2018-05-21 MED ORDER — SACUBITRIL-VALSARTAN 24-26 MG PO TABS: 1.0000 | ORAL_TABLET | Freq: Two times a day (BID) | ORAL | 11 refills | Status: DC
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
## 2018-05-21 NOTE — Patient Instructions (Addendum)
DECREASE Entresto to 24/25 mg, one tab twice a day  -you CANNOT CUT your old tablets in half please pick up the new bottle as soon as possible  Labs today We will only contact you if something comes back abnormal or we need to make some changes. Otherwise no news is good news!  Your physician recommends that you schedule a follow-up appointment in: 2 weeks  in the Advanced Practitioners (PA/NP) Clinic    Do the following things EVERYDAY: 1) Weigh yourself in the morning before breakfast. Write it down and keep it in a log. 2) Take your medicines as prescribed 3) Eat low salt foods-Limit salt (sodium) to 2000 mg per day.  4) Stay as active as you can everyday 5) Limit all fluids for the day to less than 2 liters

## 2018-05-22 ENCOUNTER — Ambulatory Visit: Payer: PPO | Admitting: Internal Medicine

## 2018-05-22 ENCOUNTER — Encounter: Payer: Self-pay | Admitting: Internal Medicine

## 2018-05-22 VITALS — BP 114/70 | HR 75 | Ht 67.5 in | Wt 215.8 lb

## 2018-05-22 DIAGNOSIS — I5022 Chronic systolic (congestive) heart failure: Secondary | ICD-10-CM

## 2018-05-22 DIAGNOSIS — I480 Paroxysmal atrial fibrillation: Secondary | ICD-10-CM

## 2018-05-22 DIAGNOSIS — I25810 Atherosclerosis of coronary artery bypass graft(s) without angina pectoris: Secondary | ICD-10-CM | POA: Diagnosis not present

## 2018-05-22 DIAGNOSIS — Z951 Presence of aortocoronary bypass graft: Secondary | ICD-10-CM

## 2018-05-22 NOTE — Patient Instructions (Signed)
Medication Instructions:  Dr Debara Pickett recommends that you continue on your current medications as directed. Please refer to the Current Medication list given to you today.  If you need a refill on your cardiac medications before your next appointment, please call your pharmacy.   Lab work: NONE ORDERED  If you have labs (blood work) drawn today and your tests are completely normal, you will receive your results only by: Marland Kitchen MyChart Message (if you have MyChart) OR . A paper copy in the mail If you have any lab test that is abnormal or we need to change your treatment, we will call you to review the results.  Testing/Procedures: NONE ORDERED  Follow-Up: At [facility], you and your health needs are our priority.  As part of our continuing mission to provide you with exceptional heart care, we have created designated Provider Care Teams.  These Care Teams include your primary Cardiologist (physician) and Advanced Practice Providers (APPs -  Physician Assistants and Nurse Practitioners) who all work together to provide you with the care you need, when you need it. You will need a follow up appointment in 6 months.  Please call our office 2 months in advance to schedule this appointment.  You may see [name], MD or one of the following Advanced Practice Providers on your designated Care Team: [place], [place] . Fabian Sharp, PA-C

## 2018-05-22 NOTE — Progress Notes (Signed)
OFFICE NOTE  Chief Complaint:  Follow-up heart failure  Primary Care Physician: [name], MD  HPI:  [name] [name] is a pleasant 72 year old female who I recently saw the hospital when she was in the cardiac care unit. She has a long standing history of Hypertension and tobacco abuse. She was admitted to [facility] 10/29/2014 with a several week history of worsening symptoms of shortness of breath, chest tightness, fatigue, and URI symptoms. She was initially evaluated by her PCP at which time she was noted to be in Atrial Fibrillation with RVR. She was sent to the emergency department for evaluation. EKG confirmed Atrial Fibrillation, Troponin was negative. CXR was obtained and was concerning for pneumonia and she was started on empiric ABX. She also underwent TTE which showed a reduced EF of 35-40%. She had diffuse global hypokinesis especially of the inferior wall. It was felt the patient should undergo cardiac catheterization for further diagnostics, however patient became anxious and would not consent to the procedure. The patient calmed down and in a few days and further consultation she was agreeable to proceed with cardiac catheterization. This was to be done on 11/03/2014 however, the prior to the procedure the patient became agitated, combativeness and shortness of breath. She progressed to full cardiac arrest with asystole without any preceding signs of symptoms. She was successful resuscitated but showed signs of decerebrate posturing and was placed on the cooling protocol. During cooling she continued to have Atrial Fibrillation which progressed to bradycardia. It was again decided she should undergo cardiac catheterization. This was completed on 11/04/2014 and revealed significant coronary artery disease. It was felt coronary bypass grafting would be the treatment of choice and TCTS was consulted. She was evaluated by Dr. Roxy Manns who agreed at some point she  would benefit from Coronary Bypass grafting procedure. However at the time of evaluation the patient remained on a ventilator and was sedated without signs of neurologic recovery. It was felt medical therapy should be continued at this time and if patient awakes and returns to baseline surgery could be reconsidered at that point.The patient was treated medically for several days. She was successfully weaned and later extubated. She continued to have issues with Atrial Fibrillation. She was again evaluated by Dr. Roxy Manns on 11/11/2014 at which time he felt she was medically stable to proceed with Coronary Bypass grafting procedure. The risks and benefits of the procedure were explained to the patient and she was agreeable to proceed. She was taken to the operating room on 11/14/2014. She underwent CABG x 2 utilizing LIMA to LAD, SVG to OM. She also underwent clipping of her Left Atrial Appendage and endoscopic saphenous vein harvest from her right thigh. She tolerated the procedure well and was taken to the SICU in stable condition. The patient was extubated the evening of surgery. During her stay in the SICU she developed Rapid Atrial Fibrillation. She was treated with Amiodarone and Lopressor, but ultimately did not convert to NSR. EP consult was requested and it was felt cardioversion could be attempted. This was completed on POD # 4 and was unsuccessful. However, the patient ultimately converted to NSR on her own. She remains on Lopressor, which has been titrated as tolerated. She was also placed on Eliquis for stroke prevention.  She returns today for follow-up and is feeling quite well. She denies any chest pain or worsening shortness of breath. She is interesting in getting off of amiodarone completely. She seems to be maintaining sinus  rhythm at a rate of 55 with occasional PVCs.  [name] [name] returns today for follow-up. She denies any chest pain but is reporting some tenderness and heaviness  in the left breast. She feels that it somewhat enlarged and feels heavier than the right side. It was before thought that her pain in the left chest may be related to her sternotomy however her symptoms are worsening. She denies any nipple discharge or skin changes in that area. She had been struggling with a Candida intertrigo which was successfully treated with topical medications. In addition she has some chronic back pain which I think is unrelated. Recently she saw a nurse practitioner and was placed on carvedilol as an alternative to her metoprolol. She seems to be tolerating this fairly well. Heart rate is now higher than it had been previously.  02/08/2016  [name] [name] returns today for follow-up. At her last office visit she was complaining of some breast heaviness and pain and I ordered a mammogram which was negative for any malignancy or masses. She appears to have had resolution of her Candida intertrigo and I've indicated that she could use baby powder or a dry antiperspirant to help with sweating and itching. She continues to have some complaints of numbness and tingling the go down both arms which could be arising from the shoulders or neck. I will refer to orthopedics for this. In addition she reports some shortness of breath and weight gain which she thinks is related to medicines however it seems to be more related to appetite and decreased exercise. She is also back off on her smoking some but has not yet been able to quit.  05/19/2016  [name] [name] is seen today in follow-up. She has a number of unusual complaints today. Most recently however she was placed on a number of medications for blood pressure from her primary care provider which were different for the medications we have listed for her. She noted some side effects from these and says that she stopped taking them and resume taking the medications that she is currently on. Of note she has lost 7 pounds since her last office visit. She  denies any worsening shortness of breath or chest pain. She is due for repeat echo for repeat assessment of her cardiomyopathy.  12/26/2016  Mrs. Chiasson was seen today for follow-up. She recently notify the office she wanted to switch cardiologist because she was looking for a female cardiologist that could help her with her hot flashes. She says she breaks out in a sweat for no reason. She's also been having problems with abdominal discomfort. Her ejection fraction actually has improved significantly recently by echo from 35-40% up to 50-55% in October 2017. Despite this, she reports she's been short of breath recently. Blood pressure remains elevated on concerned about the medication compliance.  02/08/2017  Jacqualine returns for follow-up she had a repeat echo which was scheduled that showed stable LVEF of 50-55%. Unfortunately, she was briefly admitted for a-fib with RVR. She was started on diltiazem and converted overnight. Seen by [name] who spoke with me to confirm since she has had surgical closure of the LAA she does not require anticoagulation. She is on aspirin for CAD. She was not discharged on additional medication. Since she responded to CCB, I will start a low dose to help prevent recurrence. She is still concerned about dysphagia, particularly to solid foods and bloating. She takes ranitidine.  06/22/2017  I will was seen today in follow-up.  Recently she was noted to have A. fib with RVR.  She saw Almyra Deforest, PA-C, who further increased her diltiazem to 240 mg daily.  She says she is not tolerated this, that she broke out in a rash and had candidal type intertrigo under her breasts.  She feels like this is all related to the increased dose of the diltiazem and therefore discontinued the medicine.  She reports taking her carvedilol twice a day and this would explain why her A. fib rate is uncontrolled today at 138 bpm.  Despite this she is asymptomatic.  11/20/2017  [name] [name] returns today  for follow-up.  She was recently hospitalized for congestive heart failure, successfully diuresed and struggled with delirium and agitation.  She then had a fever toward the end of hospitalization was diagnosed with influenza pneumonia.  Her hospitalization was further extended until she recovered.  Today she reports no recollection of that time in the hospital nor does she remember me visiting her.  Today she denies any chest pain or worsening shortness of breath.  She is in A. fib with RVR.  Her medications have been adjusted.  She says her heart rate is slower at home.  She is currently out of her metoprolol, and her sister says that she has been off that medication for about a month.  She was seen by our nurse practitioner Leonia Reader, DNP, who felt that she should be referred to the advanced heart failure clinic.  An appointment is scheduled later this month.  05/22/2018  [name] [name] is seen today for follow-up heart failure.  Although she is been seen very regularly at the heart failure clinic, she says she does not want to stop seeing me.  She was most recently seen and started on Entresto and the dose was increased to 49/51 mg.  She says this is actually caused some heaviness in her chest.  She feels like when she lays down her chest caves in somewhat.  Although this is not a typical symptom, her dose was just recently decreased back to the 24/26 mg.  She is getting excellent para medicine care who helps her with her medications and has done very well maintaining a lower weight although recently had has been going up.  In April she weighed 198 and now she weighs 215.  She denies any worsening shortness of breath.  She was summoned for jury duty and does not feel that she can comply with this due to her heart failure.  PMHx:  Past Medical History:  Diagnosis Date  . Acute respiratory failure ([place]) 08/2017  . Arthritis   . Asthma    ??  . CAD (coronary artery disease)    a. s/p CABG 2016.  .  Cardiomyopathy, ischemic    a. EF previously low, improved to LVEF 50-55% as of May 2018  . Chronic combined systolic and diastolic heart failure ([place])   . CKD (chronic kidney disease), stage III ([place])   . Gout   . Hypertension   . Hypokalemia   . LGI bleed 08/06/2017   a. felt to be hemorrhoidal during that admission (no drop in Hgb).  . Persistent atrial fibrillation    a. h/o difficult to control rates (complicated by noncompliance), not felt to be a candidate for ablation or antiarrhythmic due to noncompliance.  . Personal history of noncompliance with medical treatment, presenting hazards to health   . S/P CABG x 2 with clipping of LA appendage 11/14/2014   LIMA to  LAD, SVG to OM, EVH via right thigh    Past Surgical History:  Procedure Laterality Date  . CARDIOVERSION N/A 11/18/2014   Procedure: CARDIOVERSION;  Surgeon: [name], MD;  Location: [place];  Service: Cardiovascular;  Laterality: N/A;  . CARDIOVERSION N/A 12/25/2017   Procedure: CARDIOVERSION;  Surgeon: [name], MD;  Location: [place];  Service: Cardiovascular;  Laterality: N/A;  . CLIPPING OF ATRIAL APPENDAGE N/A 11/14/2014   Procedure: CLIPPING OF ATRIAL APPENDAGE;  Surgeon: [name], MD;  Location: [place];  Service: Open Heart Surgery;  Laterality: N/A;  . CORONARY ARTERY BYPASS GRAFT N/A 11/14/2014   Procedure: CORONARY ARTERY BYPASS GRAFTING (CABG)TIMES 2 USING LEFT INTERNAL MAMMARY ARTERY AND RIGHT SAPHENOUS VEIN HARVESTED ENDOSCOPICALLY;  Surgeon: [name], MD;  Location: [place];  Service: Open Heart Surgery;  Laterality: N/A;  . LEFT HEART CATHETERIZATION WITH CORONARY ANGIOGRAM N/A 11/04/2014   Procedure: LEFT HEART CATHETERIZATION WITH CORONARY ANGIOGRAM;  Surgeon: Troy Sine, MD;  Location[facility] CATH LAB;  Service: Cardiovascular;  Laterality: N/A;  . TEE WITHOUT CARDIOVERSION N/A 11/14/2014   Procedure: TRANSESOPHAGEAL ECHOCARDIOGRAM (TEE);  Surgeon: [name], MD;  Location: [place];  Service: Open Heart Surgery;  Laterality: N/A;  . TEE WITHOUT CARDIOVERSION N/A 12/25/2017   Procedure: TRANSESOPHAGEAL ECHOCARDIOGRAM (TEE);  Surgeon: [name], MD;  Location: [facility] ENDOSCOPY;  Service: Cardiovascular;  Laterality: N/A;  . TEMPORARY PACEMAKER INSERTION  11/04/2014   Procedure: TEMPORARY PACEMAKER INSERTION;  Surgeon: Troy Sine, MD;  Location[facility] CATH LAB;  Service: Cardiovascular;;    FAMHx:  Family History  Problem Relation Age of Onset  . Cancer Mother   . Heart disease Father     SOCHx:   reports that she quit smoking about 9 months ago. Her smoking use included cigarettes. She has a 5.60 pack-year smoking history. She has never used smokeless tobacco. She reports that she does not drink alcohol or use drugs.  ALLERGIES:  Allergies  Allergen Reactions  . Bee Venom Anaphylaxis  . Codeine Nausea And Vomiting  . Shrimp [Shellfish Allergy] Swelling  . Tomato     Pt states it gives her gout    ROS: Pertinent items noted in HPI and remainder of comprehensive ROS otherwise negative.  HOME MEDS: Current Outpatient Medications  Medication Sig Dispense Refill  . allopurinol (ZYLOPRIM) 300 MG tablet Take 1 tablet (300 mg total) by mouth daily. 30 tablet 11  . atorvastatin (LIPITOR) 10 MG tablet Take 10 mg by mouth daily.    . budesonide (PULMICORT) 0.5 MG/2ML nebulizer solution Take 2 mLs (0.5 mg total) by nebulization 2 (two) times daily.  12  . fluticasone (FLONASE) 50 MCG/ACT nasal spray Place 1 spray into both nostrils daily. 16 g 2  . furosemide (LASIX) 20 MG tablet TAKE 3 TABLETS (60 MG TOTAL) BY MOUTH 2 (TWO) TIMES DAILY. 180 tablet 11  . levalbuterol (XOPENEX) 0.63 MG/3ML nebulizer solution Take 3 mLs (0.63 mg total) by nebulization every 6 (six) hours as needed for wheezing or shortness of breath. 3 mL 12  . magnesium oxide (MAG-OX) 400 MG tablet Take 1 tablet (400 mg total) by mouth 3 (three) times daily. 270 tablet 3  . ranitidine (ZANTAC)  150 MG capsule Take 150 mg by mouth 2 (two) times daily as needed for heartburn.     . rivaroxaban (XARELTO) 20 MG TABS tablet Take 1 tablet (20 mg total) by mouth daily with supper. 30 tablet 11  .  sacubitril-valsartan (ENTRESTO) 24-26 MG Take 1 tablet by mouth 2 (two) times daily. 60 tablet 11  . spironolactone (ALDACTONE) 25 MG tablet Take 0.5 tablets (12.5 mg total) by mouth daily. 45 tablet 3  . tizanidine (ZANAFLEX) 2 MG capsule Take 1 capsule (2 mg total) by mouth at bedtime as needed (back pain). 30 capsule 0  . acetaminophen (TYLENOL) 500 MG tablet Take 500 mg by mouth every 6 (six) hours as needed for moderate pain.      No current facility-administered medications for this visit.     LABS/IMAGING: Results for orders placed or performed during the hospital encounter of 05/21/18 (from the past 48 hour(s))  Basic metabolic panel     Status: Abnormal   Collection Time: 05/21/18  2:43 PM  Result Value Ref Range   Sodium 139 135 - 145 mmol/L   Potassium 3.6 3.5 - 5.1 mmol/L   Chloride 109 98 - 111 mmol/L   CO2 22 22 - 32 mmol/L   Glucose, Bld 101 (H) 70 - 99 mg/dL   BUN 30 (H) 8 - 23 mg/dL   Creatinine, Ser 1.46 (H) 0.44 - 1.00 mg/dL   Calcium 8.9 8.9 - 10.3 mg/dL   GFR calc non Af Amer 34 (L) >60 mL/min   GFR calc Af Amer 40 (L) >60 mL/min    Comment: (NOTE) The eGFR has been calculated using the CKD EPI equation. This calculation has not been validated in all clinical situations. eGFR's persistently <60 mL/min signify possible Chronic Kidney Disease.    Anion gap 8 5 - 15    Comment: Performed at [facility] [address] , [place] [address]  B Nat Peptide     Status: None   Collection Time: 05/21/18  2:44 PM  Result Value Ref Range   B Natriuretic Peptide 57.2 0.0 - 100.0 pg/mL    Comment: Performed at [facility] [address] [place], [place] [address]   No results found.  WEIGHTS: Wt Readings from Last 3 Encounters:  05/22/18 215 lb  12.8 oz (97.9 kg)  05/21/18 215 lb 12.8 oz (97.9 kg)  05/18/18 214 lb 12.8 oz (97.4 kg)    VITALS: BP 114/70   Pulse 75   Ht 5' 7.5" (1.715 m)   Wt 215 lb 12.8 oz (97.9 kg)   BMI 33.30 kg/m   EXAM: General appearance: alert, no distress and mildly obese Neck: no carotid bruit and no JVD Lungs: clear to auscultation bilaterally Heart: irregularly irregular rhythm and Tachycardic Abdomen: soft, non-tender; bowel sounds normal; no masses,  no organomegaly Extremities: extremities normal, atraumatic, no cyanosis or edema Pulses: 2+ and symmetric Skin: Skin color, texture, turgor normal. No rashes or lesions Neurologic: Grossly normal Psych: Unusual affect  EKG: Normal sinus rhythm at 75, high lateral T wave inversions-personally reviewed  ASSESSMENT: 1. PAF- now in sinus, off anti-arrhythmic therapy 2. Coronary artery disease status post CABG 2 with LIMA to LAD and SVG to OM 3. Acute on chronic combined systolic congestive heart failure, EF 35-40% (improved to 50-55% - 5/18) 4. Paroxysmal atrial fibrillation-CHADSVASC 4, - status post left atrial closure (not on anticoagulation) 5. Dyslipidemia 6. Hypertension - uncontrolled 7. Dyspnea on exertion 8. Dysphasia to solid foods, bloating  PLAN: 1.   Mrs. Golomb is in sinus rhythm today.  She appears euvolemic on exam however weight is increasing.  I suspect this may be due to appetite.  She is due for repeat echo to reassess LV  function in November.  She was on increasing dose of Entresto however because of some perceived side effect of chest discomfort she has recently decrease the dose.  This is still a concern for her today.  I doubt it is related to the medication however and and tried to reassure her.  Finally, she was summoned to jury duty.  As per the laws as she is over 40 years old this is potentially excuse a bowl.  In addition she has congestive heart failure which would make it difficult for her to travel back and forth to  a jury trial.  I provided her with letter of medical excuse today and encouraged her to send that in along with additional paperwork as requested.  Follow-up with me in 6 months.  [name], MD, [facility], [place] Director of the Advanced Lipid Disorders &  Cardiovascular Risk Reduction Clinic Diplomate of the American Board of Clinical Lipidology Attending Cardiologist  Direct Dial: [phone]  Fax: [phone]  Website:  [email or web address] 05/22/2018, 9:15 AM

## 2018-05-23 ENCOUNTER — Other Ambulatory Visit (HOSPITAL_COMMUNITY): Payer: Self-pay

## 2018-05-23 ENCOUNTER — Telehealth (HOSPITAL_COMMUNITY): Payer: Self-pay

## 2018-05-23 NOTE — Telephone Encounter (Signed)
I called [name] [name] to let her know about the conversation I had with [name] regarding her medications. She did not answer so I left a voicemail requesting she call me back.

## 2018-05-23 NOTE — Progress Notes (Signed)
Paramedicine Encounter    Patient ID: [name] [name], female    DOB: [birth year], 72 y.o.   MRN: [id number]   Patient Care Team: [name], MD as PCP - General (Internal Medicine) Debara Pickett Nadean Corwin, MD as PCP - Cardiology (Cardiology) Debara Pickett Nadean Corwin, MD as Consulting Physician (Cardiology)  Patient Active Problem List   Diagnosis Date Noted  . Postmenopausal vaginal bleeding 01/04/2018  . Snoring 01/04/2018  . Acute on chronic combined systolic and diastolic CHF (congestive heart failure) ([place])   . CHF exacerbation ([place]) 10/16/2017  . Noncompliance 10/16/2017  . Acute on chronic congestive heart failure ([place])   . Influenza with respiratory manifestation flu a + 09/14/16 started on tamiflu 09/15/17 09/19/2017  . Thrombocytopenia ([place]) 09/14/2017  . Acute drug-induced gout of right foot   . Medication noncompliance due to cognitive impairment 09/06/2017  . Acute diastolic CHF (congestive heart failure) ([place]) 08/06/2017  . LGI bleed, likely hemorrhoids 08/06/2017  . Atrial fibrillation ([place]) 08/04/2017  . Paroxysmal atrial fibrillation ([place]) 02/08/2017  . Cardiomyopathy, ischemic 02/08/2017  . Dysphagia 02/08/2017  . CKD (chronic kidney disease), stage III ([place]) 01/30/2017  . Pain in shoulder 02/08/2016  . Breast pain, left 02/08/2016  . Bilateral arm numbness and tingling while sleeping 02/08/2016  . Painful lumpy left breast 09/23/2015  . Candidal intertrigo 02/11/2015  . S/P CABG x 2 11/14/2014  . Accelerated hypertension   . Cardiac arrest ([place]) 11/04/2014  . Left main coronary artery disease 11/04/2014  . Coronary artery disease due to lipid rich plaque   . Acute respiratory failure with hypoxemia ([place])   . Essential hypertension   . Atrial fibrillation with RVR ([place])   . Hypokalemia 10/30/2014  . Chronic diastolic CHF (congestive heart failure) ([place]) 10/30/2014  . NSTEMI (non-ST elevated myocardial infarction) ([place])   . DOE (dyspnea on exertion)   . CAP (community  acquired pneumonia) 10/29/2014    Current Outpatient Medications:  .  acetaminophen (TYLENOL) 500 MG tablet, Take 500 mg by mouth every 6 (six) hours as needed for moderate pain. , Disp: , Rfl:  .  allopurinol (ZYLOPRIM) 300 MG tablet, Take 1 tablet (300 mg total) by mouth daily., Disp: 30 tablet, Rfl: 11 .  atorvastatin (LIPITOR) 10 MG tablet, Take 10 mg by mouth daily., Disp: , Rfl:  .  budesonide (PULMICORT) 0.5 MG/2ML nebulizer solution, Take 2 mLs (0.5 mg total) by nebulization 2 (two) times daily., Disp: , Rfl: 12 .  fluticasone (FLONASE) 50 MCG/ACT nasal spray, Place 1 spray into both nostrils daily., Disp: 16 g, Rfl: 2 .  furosemide (LASIX) 20 MG tablet, TAKE 3 TABLETS (60 MG TOTAL) BY MOUTH 2 (TWO) TIMES DAILY., Disp: 180 tablet, Rfl: 11 .  magnesium oxide (MAG-OX) 400 MG tablet, Take 1 tablet (400 mg total) by mouth 3 (three) times daily., Disp: 270 tablet, Rfl: 3 .  ranitidine (ZANTAC) 150 MG capsule, Take 150 mg by mouth 2 (two) times daily as needed for heartburn. , Disp: , Rfl:  .  rivaroxaban (XARELTO) 20 MG TABS tablet, Take 1 tablet (20 mg total) by mouth daily with supper., Disp: 30 tablet, Rfl: 11 .  spironolactone (ALDACTONE) 25 MG tablet, Take 0.5 tablets (12.5 mg total) by mouth daily., Disp: 45 tablet, Rfl: 3 .  tizanidine (ZANAFLEX) 2 MG capsule, Take 1 capsule (2 mg total) by mouth at bedtime as needed (back pain)., Disp: 30 capsule, Rfl: 0 .  levalbuterol (XOPENEX) 0.63 MG/3ML nebulizer solution, Take 3 mLs (0.63 mg total)  by nebulization every 6 (six) hours as needed for wheezing or shortness of breath. (Patient not taking: Reported on 05/23/2018), Disp: 3 mL, Rfl: 12 .  sacubitril-valsartan (ENTRESTO) 24-26 MG, Take 1 tablet by mouth 2 (two) times daily., Disp: 60 tablet, Rfl: 11 Allergies  Allergen Reactions  . Bee Venom Anaphylaxis  . Codeine Nausea And Vomiting  . Shrimp [Shellfish Allergy] Swelling  . Tomato     Pt states it gives her gout      Social  History   Socioeconomic History  . Marital status: Widowed    Spouse name: Not on file  . Number of children: Not on file  . Years of education: Not on file  . Highest education level: Not on file  Occupational History  . Not on file  Social Needs  . Financial resource strain: Somewhat hard  . Food insecurity:    Worry: Never true    Inability: Never true  . Transportation needs:    Medical: No    Non-medical: No  Tobacco Use  . Smoking status: Former Smoker    Packs/day: 0.10    Years: 56.00    Pack years: 5.60    Types: Cigarettes    Last attempt to quit: 2019    Years since quitting: 0.7  . Smokeless tobacco: Never Used  Substance and Sexual Activity  . Alcohol use: No    Alcohol/week: 0.0 standard drinks  . Drug use: No  . Sexual activity: Not on file  Lifestyle  . Physical activity:    Days per week: Not on file    Minutes per session: Not on file  . Stress: Not on file  Relationships  . Social connections:    Talks on phone: Not on file    Gets together: Not on file    Attends religious service: Not on file    Active member of club or organization: Not on file    Attends meetings of clubs or organizations: Not on file    Relationship status: Not on file  . Intimate partner violence:    Fear of current or ex partner: Not on file    Emotionally abused: Not on file    Physically abused: Not on file    Forced sexual activity: Not on file  Other Topics Concern  . Not on file  Social History Narrative   Patient reports it being difficult to pay for everything due to outstanding medical bills from hospital stay last year but states she is able to keep up with basic expenses.  Patient does have some concerns with her house's condition due to a water leak- had her roof repaired last month but has some leaking in the front which has affected her porch.  Patient owns her own car and is able to drive herself but has some concerns about the reliability of her car- has  gotten taxi to the clinic for appointment in the past when her car broke down.    Physical Exam  Constitutional: She is oriented to person, place, and time.  Cardiovascular: Normal rate and regular rhythm.  Pulmonary/Chest: Effort normal and breath sounds normal.  Abdominal: Soft.  Musculoskeletal: Normal range of motion. She exhibits no edema.  Neurological: She is alert and oriented to person, place, and time.  Skin: Skin is warm and dry.  Psychiatric: She has a normal mood and affect.        Future Appointments  Date Time Provider [place]  06/07/2018  1:30 PM  [facility] PA/NP [facility] None  06/25/2018  2:00 PM MC ECHO 1-BUZZ [facility]  06/25/2018  3:00 PM Bensimhon, Shaune Pascal, MD [facility] None    BP 140/78 (BP Location: Left Arm, Patient Position: Sitting, Cuff Size: Large)   Pulse 68   Resp 16   Wt 214 lb (97.1 kg)   SpO2 96%   BMI 33.02 kg/m   Weight yesterday- 215 lb Last visit weight- 215 lb  Md Hand was seen at home today and reported feeling well. She denied SOB, headache, dizziness or orthopnea. She reported she has been compliant with her medications over the past week and her weight has been stable. She is currently in need of several medications as we are working to get her into a program via the pharmacist at her PCP. Her medications were verified and her pillbox was refilled.   [name], EMT 05/23/18  ACTION: Home visit completed Next visit planned for 1 week

## 2018-05-24 ENCOUNTER — Telehealth (HOSPITAL_COMMUNITY): Payer: Self-pay

## 2018-05-24 ENCOUNTER — Other Ambulatory Visit (HOSPITAL_COMMUNITY): Payer: Self-pay

## 2018-05-24 ENCOUNTER — Telehealth (HOSPITAL_COMMUNITY): Payer: Self-pay | Admitting: Pharmacist

## 2018-05-24 NOTE — Progress Notes (Signed)
I saw [name] [name] at home today to finish filling her pillbox since her medications had just been delivered. Nothing further was needed during my visit. I will follow up next week.

## 2018-05-24 NOTE — Telephone Encounter (Signed)
[employer] patient assistance approved for Xarelto 20 mg daily for up to 1 year.   Ruta Hinds. Velva Harman, PharmD, BCPS, CPP Clinical Pharmacist Phone: [phone] 05/24/2018 2:31 PM

## 2018-05-24 NOTE — Telephone Encounter (Signed)
Ms Sliter returned my phone call and I was able to make her aware that a medications delivery service would be bringing her medications tomorrow. I asked that she call me tomorrow and let me know when the medications have arrived so I can finish filling her pillbox. She was understanding and agreeable.

## 2018-05-24 NOTE — Telephone Encounter (Signed)
[name] [name] called me to let me know her medications had been delivered. I advised that I would be able to come by around 14:00 to finish filling her pillbox and she agreed.

## 2018-05-25 ENCOUNTER — Telehealth (HOSPITAL_COMMUNITY): Payer: Self-pay | Admitting: Vascular Surgery

## 2018-05-25 NOTE — Telephone Encounter (Signed)
Left pt message to move  11/11 echo and f/u w/ db . Db will not be in office, pt can reschedule for next ava echo and f/u

## 2018-05-29 ENCOUNTER — Telehealth (HOSPITAL_COMMUNITY): Payer: Self-pay

## 2018-05-29 NOTE — Telephone Encounter (Signed)
I called [name] [name] to schedule an appointment for this week. She did not answer so I left a voicemail requesting she call me back.

## 2018-05-30 ENCOUNTER — Telehealth (HOSPITAL_COMMUNITY): Payer: Self-pay

## 2018-05-30 ENCOUNTER — Other Ambulatory Visit (HOSPITAL_COMMUNITY): Payer: Self-pay

## 2018-05-30 NOTE — Telephone Encounter (Signed)
I called [name] [name] to see if she was available for an appointment. She stated she was just getting up but she would be ready in about an hour. We agreed to meet at 09:30.

## 2018-05-30 NOTE — Progress Notes (Signed)
Paramedicine Encounter    Patient ID: [name] [name], female    DOB: [birth year], 72 y.o.   MRN: [id number]   Patient Care Team: [name], MD as PCP - General (Internal Medicine) Debara Pickett Nadean Corwin, MD as PCP - Cardiology (Cardiology) Debara Pickett Nadean Corwin, MD as Consulting Physician (Cardiology)  Patient Active Problem List   Diagnosis Date Noted  . Postmenopausal vaginal bleeding 01/04/2018  . Snoring 01/04/2018  . Acute on chronic combined systolic and diastolic CHF (congestive heart failure) ([place])   . CHF exacerbation ([place]) 10/16/2017  . Noncompliance 10/16/2017  . Acute on chronic congestive heart failure ([place])   . Influenza with respiratory manifestation flu a + 09/14/16 started on tamiflu 09/15/17 09/19/2017  . Thrombocytopenia ([place]) 09/14/2017  . Acute drug-induced gout of right foot   . Medication noncompliance due to cognitive impairment 09/06/2017  . Acute diastolic CHF (congestive heart failure) ([place]) 08/06/2017  . LGI bleed, likely hemorrhoids 08/06/2017  . Atrial fibrillation ([place]) 08/04/2017  . Paroxysmal atrial fibrillation ([place]) 02/08/2017  . Cardiomyopathy, ischemic 02/08/2017  . Dysphagia 02/08/2017  . CKD (chronic kidney disease), stage III ([place]) 01/30/2017  . Pain in shoulder 02/08/2016  . Breast pain, left 02/08/2016  . Bilateral arm numbness and tingling while sleeping 02/08/2016  . Painful lumpy left breast 09/23/2015  . Candidal intertrigo 02/11/2015  . S/P CABG x 2 11/14/2014  . Accelerated hypertension   . Cardiac arrest ([place]) 11/04/2014  . Left main coronary artery disease 11/04/2014  . Coronary artery disease due to lipid rich plaque   . Acute respiratory failure with hypoxemia ([place])   . Essential hypertension   . Atrial fibrillation with RVR ([place])   . Hypokalemia 10/30/2014  . Chronic diastolic CHF (congestive heart failure) ([place]) 10/30/2014  . NSTEMI (non-ST elevated myocardial infarction) ([place])   . DOE (dyspnea on exertion)   . CAP (community  acquired pneumonia) 10/29/2014    Current Outpatient Medications:  .  acetaminophen (TYLENOL) 500 MG tablet, Take 500 mg by mouth every 6 (six) hours as needed for moderate pain. , Disp: , Rfl:  .  allopurinol (ZYLOPRIM) 300 MG tablet, Take 1 tablet (300 mg total) by mouth daily., Disp: 30 tablet, Rfl: 11 .  atorvastatin (LIPITOR) 10 MG tablet, Take 10 mg by mouth daily., Disp: , Rfl:  .  fluticasone (FLONASE) 50 MCG/ACT nasal spray, Place 1 spray into both nostrils daily., Disp: 16 g, Rfl: 2 .  furosemide (LASIX) 20 MG tablet, TAKE 3 TABLETS (60 MG TOTAL) BY MOUTH 2 (TWO) TIMES DAILY., Disp: 180 tablet, Rfl: 11 .  magnesium oxide (MAG-OX) 400 MG tablet, Take 1 tablet (400 mg total) by mouth 3 (three) times daily., Disp: 270 tablet, Rfl: 3 .  ranitidine (ZANTAC) 150 MG capsule, Take 150 mg by mouth 2 (two) times daily as needed for heartburn. , Disp: , Rfl:  .  rivaroxaban (XARELTO) 20 MG TABS tablet, Take 1 tablet (20 mg total) by mouth daily with supper., Disp: 30 tablet, Rfl: 11 .  sacubitril-valsartan (ENTRESTO) 24-26 MG, Take 1 tablet by mouth 2 (two) times daily., Disp: 60 tablet, Rfl: 11 .  spironolactone (ALDACTONE) 25 MG tablet, Take 0.5 tablets (12.5 mg total) by mouth daily., Disp: 45 tablet, Rfl: 3 .  tizanidine (ZANAFLEX) 2 MG capsule, Take 1 capsule (2 mg total) by mouth at bedtime as needed (back pain)., Disp: 30 capsule, Rfl: 0 .  budesonide (PULMICORT) 0.5 MG/2ML nebulizer solution, Take 2 mLs (0.5 mg total) by nebulization 2 (two)  times daily., Disp: , Rfl: 12 .  levalbuterol (XOPENEX) 0.63 MG/3ML nebulizer solution, Take 3 mLs (0.63 mg total) by nebulization every 6 (six) hours as needed for wheezing or shortness of breath. (Patient not taking: Reported on 05/23/2018), Disp: 3 mL, Rfl: 12 Allergies  Allergen Reactions  . Bee Venom Anaphylaxis  . Codeine Nausea And Vomiting  . Shrimp [Shellfish Allergy] Swelling  . Tomato     Pt states it gives her gout      Social  History   Socioeconomic History  . Marital status: Widowed    Spouse name: Not on file  . Number of children: Not on file  . Years of education: Not on file  . Highest education level: Not on file  Occupational History  . Not on file  Social Needs  . Financial resource strain: Somewhat hard  . Food insecurity:    Worry: Never true    Inability: Never true  . Transportation needs:    Medical: No    Non-medical: No  Tobacco Use  . Smoking status: Former Smoker    Packs/day: 0.10    Years: 56.00    Pack years: 5.60    Types: Cigarettes    Last attempt to quit: 2019    Years since quitting: 0.7  . Smokeless tobacco: Never Used  Substance and Sexual Activity  . Alcohol use: No    Alcohol/week: 0.0 standard drinks  . Drug use: No  . Sexual activity: Not on file  Lifestyle  . Physical activity:    Days per week: Not on file    Minutes per session: Not on file  . Stress: Not on file  Relationships  . Social connections:    Talks on phone: Not on file    Gets together: Not on file    Attends religious service: Not on file    Active member of club or organization: Not on file    Attends meetings of clubs or organizations: Not on file    Relationship status: Not on file  . Intimate partner violence:    Fear of current or ex partner: Not on file    Emotionally abused: Not on file    Physically abused: Not on file    Forced sexual activity: Not on file  Other Topics Concern  . Not on file  Social History Narrative   Patient reports it being difficult to pay for everything due to outstanding medical bills from hospital stay last year but states she is able to keep up with basic expenses.  Patient does have some concerns with her house's condition due to a water leak- had her roof repaired last month but has some leaking in the front which has affected her porch.  Patient owns her own car and is able to drive herself but has some concerns about the reliability of her car- has  gotten taxi to the clinic for appointment in the past when her car broke down.    Physical Exam  Eyes: Pupils are equal, round, and reactive to light.  Neck: Neck supple.  Cardiovascular: Normal rate and regular rhythm.  Pulmonary/Chest: Breath sounds normal. No respiratory distress.  Abdominal: Soft. She exhibits no distension.  Musculoskeletal: She exhibits no edema.  Neurological: She is alert.  Skin: Skin is warm and dry. Capillary refill takes less than 2 seconds. She is not diaphoretic.  Psychiatric: She has a normal mood and affect.        Future Appointments  Date Time  Provider [place]  06/07/2018  1:30 PM [facility] PA/NP [facility] None  08/03/2018 10:00 AM MC ECHO 1-BUZZ MC[facility]ECHOL[facility]  08/03/2018 11:40 AM Bensimhon, Shaune Pascal, MD [facility] None    BP 120/76 (BP Location: Left Arm, Patient Position: Sitting)   Pulse 76   Resp 16   Wt 214 lb 9.6 oz (97.3 kg)   SpO2 97%   BMI 33.12 kg/m   Weight yesterday-Did not weigh  Last visit weight- 214 lbs   Arrived to find patient ambulating without difficulty with no shortness of breath. Patient complained of feeling "fuzzy headed" after taking medications. Medications reviewed and pill box filled. Patient stated after recent changes in medication her "chest feel better with new dosing". Vitals taken and are as noted. Lung sounds clear, no edema noted on assessment. Visit scheduled for one week out. Visit complete.     [name], EMT 05/30/18  ACTION: Home visit completed Next visit planned for 1  week

## 2018-06-05 ENCOUNTER — Telehealth (HOSPITAL_COMMUNITY): Payer: Self-pay

## 2018-06-05 ENCOUNTER — Other Ambulatory Visit (HOSPITAL_COMMUNITY): Payer: Self-pay

## 2018-06-05 NOTE — Telephone Encounter (Signed)
I called Ms Kesecker to schedule an appointment. She stated she had some errands to run this morning and asked that I come around 14:00. I advised that time worked well for me and I would see her then.

## 2018-06-05 NOTE — Progress Notes (Signed)
Paramedicine Encounter    Patient ID: [name] [name], female    DOB: [birth year], 72 y.o.   MRN: [id number]   Patient Care Team: [name], MD as PCP - General (Internal Medicine) Debara Pickett Nadean Corwin, MD as PCP - Cardiology (Cardiology) Debara Pickett Nadean Corwin, MD as Consulting Physician (Cardiology)  Patient Active Problem List   Diagnosis Date Noted  . Postmenopausal vaginal bleeding 01/04/2018  . Snoring 01/04/2018  . Acute on chronic combined systolic and diastolic CHF (congestive heart failure) ([place])   . CHF exacerbation ([place]) 10/16/2017  . Noncompliance 10/16/2017  . Acute on chronic congestive heart failure ([place])   . Influenza with respiratory manifestation flu a + 09/14/16 started on tamiflu 09/15/17 09/19/2017  . Thrombocytopenia ([place]) 09/14/2017  . Acute drug-induced gout of right foot   . Medication noncompliance due to cognitive impairment 09/06/2017  . Acute diastolic CHF (congestive heart failure) ([place]) 08/06/2017  . LGI bleed, likely hemorrhoids 08/06/2017  . Atrial fibrillation ([place]) 08/04/2017  . Paroxysmal atrial fibrillation ([place]) 02/08/2017  . Cardiomyopathy, ischemic 02/08/2017  . Dysphagia 02/08/2017  . CKD (chronic kidney disease), stage III ([place]) 01/30/2017  . Pain in shoulder 02/08/2016  . Breast pain, left 02/08/2016  . Bilateral arm numbness and tingling while sleeping 02/08/2016  . Painful lumpy left breast 09/23/2015  . Candidal intertrigo 02/11/2015  . S/P CABG x 2 11/14/2014  . Accelerated hypertension   . Cardiac arrest ([place]) 11/04/2014  . Left main coronary artery disease 11/04/2014  . Coronary artery disease due to lipid rich plaque   . Acute respiratory failure with hypoxemia ([place])   . Essential hypertension   . Atrial fibrillation with RVR ([place])   . Hypokalemia 10/30/2014  . Chronic diastolic CHF (congestive heart failure) ([place]) 10/30/2014  . NSTEMI (non-ST elevated myocardial infarction) ([place])   . DOE (dyspnea on exertion)   . CAP (community  acquired pneumonia) 10/29/2014    Current Outpatient Medications:  .  acetaminophen (TYLENOL) 500 MG tablet, Take 500 mg by mouth every 6 (six) hours as needed for moderate pain. , Disp: , Rfl:  .  allopurinol (ZYLOPRIM) 300 MG tablet, Take 1 tablet (300 mg total) by mouth daily., Disp: 30 tablet, Rfl: 11 .  atorvastatin (LIPITOR) 10 MG tablet, Take 10 mg by mouth daily., Disp: , Rfl:  .  budesonide (PULMICORT) 0.5 MG/2ML nebulizer solution, Take 2 mLs (0.5 mg total) by nebulization 2 (two) times daily., Disp: , Rfl: 12 .  fluticasone (FLONASE) 50 MCG/ACT nasal spray, Place 1 spray into both nostrils daily., Disp: 16 g, Rfl: 2 .  furosemide (LASIX) 20 MG tablet, TAKE 3 TABLETS (60 MG TOTAL) BY MOUTH 2 (TWO) TIMES DAILY., Disp: 180 tablet, Rfl: 11 .  levalbuterol (XOPENEX) 0.63 MG/3ML nebulizer solution, Take 3 mLs (0.63 mg total) by nebulization every 6 (six) hours as needed for wheezing or shortness of breath., Disp: 3 mL, Rfl: 12 .  magnesium oxide (MAG-OX) 400 MG tablet, Take 1 tablet (400 mg total) by mouth 3 (three) times daily., Disp: 270 tablet, Rfl: 3 .  ranitidine (ZANTAC) 150 MG capsule, Take 150 mg by mouth 2 (two) times daily as needed for heartburn. , Disp: , Rfl:  .  rivaroxaban (XARELTO) 20 MG TABS tablet, Take 1 tablet (20 mg total) by mouth daily with supper., Disp: 30 tablet, Rfl: 11 .  sacubitril-valsartan (ENTRESTO) 24-26 MG, Take 1 tablet by mouth 2 (two) times daily., Disp: 60 tablet, Rfl: 11 .  spironolactone (ALDACTONE) 25 MG tablet, Take  0.5 tablets (12.5 mg total) by mouth daily., Disp: 45 tablet, Rfl: 3 .  tizanidine (ZANAFLEX) 2 MG capsule, Take 1 capsule (2 mg total) by mouth at bedtime as needed (back pain)., Disp: 30 capsule, Rfl: 0 Allergies  Allergen Reactions  . Bee Venom Anaphylaxis  . Codeine Nausea And Vomiting  . Shrimp [Shellfish Allergy] Swelling  . Tomato     Pt states it gives her gout      Social History   Socioeconomic History  . Marital  status: Widowed    Spouse name: Not on file  . Number of children: Not on file  . Years of education: Not on file  . Highest education level: Not on file  Occupational History  . Not on file  Social Needs  . Financial resource strain: Somewhat hard  . Food insecurity:    Worry: Never true    Inability: Never true  . Transportation needs:    Medical: No    Non-medical: No  Tobacco Use  . Smoking status: Former Smoker    Packs/day: 0.10    Years: 56.00    Pack years: 5.60    Types: Cigarettes    Last attempt to quit: 2019    Years since quitting: 0.8  . Smokeless tobacco: Never Used  Substance and Sexual Activity  . Alcohol use: No    Alcohol/week: 0.0 standard drinks  . Drug use: No  . Sexual activity: Not on file  Lifestyle  . Physical activity:    Days per week: Not on file    Minutes per session: Not on file  . Stress: Not on file  Relationships  . Social connections:    Talks on phone: Not on file    Gets together: Not on file    Attends religious service: Not on file    Active member of club or organization: Not on file    Attends meetings of clubs or organizations: Not on file    Relationship status: Not on file  . Intimate partner violence:    Fear of current or ex partner: Not on file    Emotionally abused: Not on file    Physically abused: Not on file    Forced sexual activity: Not on file  Other Topics Concern  . Not on file  Social History Narrative   Patient reports it being difficult to pay for everything due to outstanding medical bills from hospital stay last year but states she is able to keep up with basic expenses.  Patient does have some concerns with her house's condition due to a water leak- had her roof repaired last month but has some leaking in the front which has affected her porch.  Patient owns her own car and is able to drive herself but has some concerns about the reliability of her car- has gotten taxi to the clinic for appointment in the  past when her car broke down.    Physical Exam  Constitutional: She is oriented to person, place, and time.  Cardiovascular: Normal rate and regular rhythm.  Pulmonary/Chest: Effort normal and breath sounds normal.  Abdominal: Soft.  Musculoskeletal: Normal range of motion. She exhibits edema.  Neurological: She is alert and oriented to person, place, and time.  Skin: Skin is warm and dry.  Psychiatric: She has a normal mood and affect.        Future Appointments  Date Time Provider [place]  06/07/2018  1:30 PM [facility] PA/NP [facility] None  08/03/2018 10:00  AM MC ECHO 1-BUZZ [facility]ECH[facility]  08/03/2018 11:40 AM Bensimhon, Shaune Pascal, MD [facility] None    BP (!) 150/80 (BP Location: Left Arm, Patient Position: Sitting, Cuff Size: Large)   Pulse 70   Resp 16   Wt 216 lb 6.4 oz (98.2 kg)   SpO2 98%   BMI 33.39 kg/m   Weight yesterday- 214.4 lb Last visit weight- 214.6 lb  Ms Lalani was seen at home today and reported feeling well. She denied SOB, headache, dizziness or orthopnea. She stated the "fog" that she had been experiencing last week had resolved. She stated she has been compliant with her medications and her weights have been stable. Her medications were verified and her pillbox was refilled.   [name], EMT 06/05/18  ACTION: Home visit completed Next visit planned for  1 week

## 2018-06-07 ENCOUNTER — Ambulatory Visit (HOSPITAL_COMMUNITY)
Admission: RE | Admit: 2018-06-07 | Discharge: 2018-06-07 | Disposition: A | Discharge status: Home / Self Care | Payer: PPO | Source: Ambulatory Visit | Attending: Internal Medicine | Admitting: Internal Medicine

## 2018-06-07 ENCOUNTER — Other Ambulatory Visit (HOSPITAL_COMMUNITY): Payer: Self-pay

## 2018-06-07 ENCOUNTER — Encounter (HOSPITAL_COMMUNITY): Payer: Self-pay

## 2018-06-07 VITALS — BP 144/80 | HR 81 | Wt 215.2 lb

## 2018-06-07 DIAGNOSIS — Z79899 Other long term (current) drug therapy: Secondary | ICD-10-CM | POA: Insufficient documentation

## 2018-06-07 DIAGNOSIS — M109 Gout, unspecified: Secondary | ICD-10-CM | POA: Diagnosis not present

## 2018-06-07 DIAGNOSIS — I2510 Atherosclerotic heart disease of native coronary artery without angina pectoris: Secondary | ICD-10-CM | POA: Insufficient documentation

## 2018-06-07 DIAGNOSIS — R001 Bradycardia, unspecified: Secondary | ICD-10-CM | POA: Insufficient documentation

## 2018-06-07 DIAGNOSIS — N183 Chronic kidney disease, stage 3 (moderate): Secondary | ICD-10-CM | POA: Diagnosis not present

## 2018-06-07 DIAGNOSIS — Z91030 Bee allergy status: Secondary | ICD-10-CM | POA: Insufficient documentation

## 2018-06-07 DIAGNOSIS — I25810 Atherosclerosis of coronary artery bypass graft(s) without angina pectoris: Secondary | ICD-10-CM

## 2018-06-07 DIAGNOSIS — I130 Hypertensive heart and chronic kidney disease with heart failure and stage 1 through stage 4 chronic kidney disease, or unspecified chronic kidney disease: Secondary | ICD-10-CM | POA: Insufficient documentation

## 2018-06-07 DIAGNOSIS — I5042 Chronic combined systolic (congestive) and diastolic (congestive) heart failure: Secondary | ICD-10-CM | POA: Diagnosis present

## 2018-06-07 DIAGNOSIS — Z91018 Allergy to other foods: Secondary | ICD-10-CM | POA: Insufficient documentation

## 2018-06-07 DIAGNOSIS — I5022 Chronic systolic (congestive) heart failure: Secondary | ICD-10-CM

## 2018-06-07 DIAGNOSIS — Z951 Presence of aortocoronary bypass graft: Secondary | ICD-10-CM | POA: Diagnosis not present

## 2018-06-07 DIAGNOSIS — I4892 Unspecified atrial flutter: Secondary | ICD-10-CM | POA: Diagnosis not present

## 2018-06-07 DIAGNOSIS — Z91013 Allergy to seafood: Secondary | ICD-10-CM | POA: Diagnosis not present

## 2018-06-07 DIAGNOSIS — I255 Ischemic cardiomyopathy: Secondary | ICD-10-CM | POA: Diagnosis not present

## 2018-06-07 DIAGNOSIS — Z8719 Personal history of other diseases of the digestive system: Secondary | ICD-10-CM | POA: Insufficient documentation

## 2018-06-07 DIAGNOSIS — Z87891 Personal history of nicotine dependence: Secondary | ICD-10-CM | POA: Diagnosis not present

## 2018-06-07 DIAGNOSIS — Z885 Allergy status to narcotic agent status: Secondary | ICD-10-CM | POA: Insufficient documentation

## 2018-06-07 DIAGNOSIS — I5032 Chronic diastolic (congestive) heart failure: Secondary | ICD-10-CM

## 2018-06-07 DIAGNOSIS — E785 Hyperlipidemia, unspecified: Secondary | ICD-10-CM | POA: Diagnosis not present

## 2018-06-07 DIAGNOSIS — J45909 Unspecified asthma, uncomplicated: Secondary | ICD-10-CM | POA: Insufficient documentation

## 2018-06-07 DIAGNOSIS — Z8249 Family history of ischemic heart disease and other diseases of the circulatory system: Secondary | ICD-10-CM | POA: Insufficient documentation

## 2018-06-07 DIAGNOSIS — I480 Paroxysmal atrial fibrillation: Secondary | ICD-10-CM | POA: Diagnosis not present

## 2018-06-07 DIAGNOSIS — Z7901 Long term (current) use of anticoagulants: Secondary | ICD-10-CM | POA: Insufficient documentation

## 2018-06-07 DIAGNOSIS — M1990 Unspecified osteoarthritis, unspecified site: Secondary | ICD-10-CM | POA: Insufficient documentation

## 2018-06-07 MED ORDER — SPIRONOLACTONE 25 MG PO TABS: 25.0000 mg | ORAL_TABLET | Freq: Every day | ORAL | 3 refills | Status: DC

## 2018-06-07 NOTE — Addendum Note (Signed)
Encounter addended by: Jorge Ny, LCSW on: 06/07/2018 4:31 PM  Actions taken: Visit Navigator Flowsheet section accepted, Sign clinical note

## 2018-06-07 NOTE — Progress Notes (Signed)
[name] [name] was seen at home after her clinic appointment to adjust her pillbox. The changes were made to reflect the new dose of spironolactone. Additionally, I contacted the pharmacist with her PCP who is currently handling her medications through an assistance program. I let him know of the new dose and he advised he would make the necessary changes for the new dose to be supplied in her next delivery.

## 2018-06-07 NOTE — Progress Notes (Signed)
`  Advanced Heart Failure Clinic  Note   PCP: [name], MD PCP-Cardiologist: [name], MD  HF: Dr Haroldine Laws   HPI: [name] [name] is a 72 y.o. female with h/o HTN, former tobacco abuse, PAF, CAD s/p CABG x 2 (LIMA to LAD and SVG to OM) in 2016, Chronic combined CHF, Ischemic CMP, HLD, and DOE.   She was initially noted to be in atrial fibrillation in 2016 with a reduced LVEF, leading to ischemic workup with a cardiac cath showing severe multivessel CAD with LM involvement in which she underwent CABG x2 utilizing LIMA to LAD, SVG to OM.   Admitted 1/19 for AF with RVR in setting of Influenza A. During that hospitalization, she was started on diltiazem drip, EP was consulted to discuss plans for Tikosyn vs. ablation vs. medication rate control however, due to her known medication noncompliance she was found to not be an ablation nor antiarrhythmic candidate  Pt admitted 3/3 - 10/20/17 with recurrent Afib RVR and ADHF. Started on diltiazem. Echo repeated which showed fall in EF from previous. Diuresed with IV lasix with 5L negative for admission. Discharged home on diltiazem 360, Torpol 100, digoxin and Xarelto.  Seen by Dr. Debara Pickett 11/20/17, was feeling well overall. Of note, she reported not remembering her recent hospitalization. She was also noted to be out of her metoprolol for approximately a month.   She was found to be in Afib. She was started on amiodarone and diltiazem was DCd. She was also started on Entresto. She underwent successful atrial flutter TEE/DCCV on 12/25/17. She was back in rate controlled afib on 5/16 during paramedic visit.  In May Toprol XL was cut back to 50 mg per day due to bradycardia. Toprol DC'd with ongoing bradycardia.   Today she returns for Hf follow up. Last visit entresto was cut back 24-26 mg twice a day per [facility] request. Overall feeling fine. Denies SOB/PND/Orthopnea. Denies chest pain.  Appetite ok. No fever or chills. Weight at home 213-215  pounds. Taking all medications.  Eho in 3/16 EF 35-40%. Echo in 10/17 EF improved to 50-55%.  F/u echo 5/18 EF stable 50-55% Echo 10/19/17 LVEF 30-35%, Moderate LVH, Mild AI, Moderate regurgitation, Severe LAE, Moderately reduced RV, Severe RAE, PA peak pressure 50 mm Hg.   (Decreased from Echo 01/11/2017 with LVEF 50-55%)  Review of systems complete and found to be negative unless listed in HPI.   Past Medical History:  Diagnosis Date  . Acute respiratory failure ([place]) 08/2017  . Arthritis   . Asthma    ??  . CAD (coronary artery disease)    a. s/p CABG 2016.  . Cardiomyopathy, ischemic    a. EF previously low, improved to LVEF 50-55% as of May 2018  . Chronic combined systolic and diastolic heart failure ([place])   . CKD (chronic kidney disease), stage III ([place])   . Gout   . Hypertension   . Hypokalemia   . LGI bleed 08/06/2017   a. felt to be hemorrhoidal during that admission (no drop in Hgb).  . Persistent atrial fibrillation    a. h/o difficult to control rates (complicated by noncompliance), not felt to be a candidate for ablation or antiarrhythmic due to noncompliance.  . Personal history of noncompliance with medical treatment, presenting hazards to health   . S/P CABG x 2 with clipping of LA appendage 11/14/2014   LIMA to LAD, SVG to OM, EVH via right thigh   Current Outpatient Medications  Medication Sig Dispense Refill  . acetaminophen (TYLENOL) 500 MG tablet Take 500 mg by mouth every 6 (six) hours as needed for moderate pain.     Marland Kitchen allopurinol (ZYLOPRIM) 300 MG tablet Take 1 tablet (300 mg total) by mouth daily. 30 tablet 11  . atorvastatin (LIPITOR) 10 MG tablet Take 10 mg by mouth daily.    . furosemide (LASIX) 20 MG tablet TAKE 3 TABLETS (60 MG TOTAL) BY MOUTH 2 (TWO) TIMES DAILY. 180 tablet 11  . magnesium oxide (MAG-OX) 400 MG tablet Take 1 tablet (400 mg total) by mouth 3 (three) times daily. 270 tablet 3  . ranitidine (ZANTAC) 150 MG capsule Take 150 mg by mouth 2  (two) times daily as needed for heartburn.     . rivaroxaban (XARELTO) 20 MG TABS tablet Take 1 tablet (20 mg total) by mouth daily with supper. 30 tablet 11  . sacubitril-valsartan (ENTRESTO) 24-26 MG Take 1 tablet by mouth 2 (two) times daily. 60 tablet 11  . spironolactone (ALDACTONE) 25 MG tablet Take 0.5 tablets (12.5 mg total) by mouth daily. 45 tablet 3  . tizanidine (ZANAFLEX) 2 MG capsule Take 1 capsule (2 mg total) by mouth at bedtime as needed (back pain). 30 capsule 0  . budesonide (PULMICORT) 0.5 MG/2ML nebulizer solution Take 2 mLs (0.5 mg total) by nebulization 2 (two) times daily. (Patient not taking: Reported on 06/07/2018)  12  . levalbuterol (XOPENEX) 0.63 MG/3ML nebulizer solution Take 3 mLs (0.63 mg total) by nebulization every 6 (six) hours as needed for wheezing or shortness of breath. (Patient not taking: Reported on 06/07/2018) 3 mL 12   No current facility-administered medications for this encounter.    Allergies  Allergen Reactions  . Bee Venom Anaphylaxis  . Codeine Nausea And Vomiting  . Shrimp [Shellfish Allergy] Swelling  . Tomato     Pt states it gives her gout   Social History   Socioeconomic History  . Marital status: Widowed    Spouse name: Not on file  . Number of children: Not on file  . Years of education: Not on file  . Highest education level: Not on file  Occupational History  . Not on file  Social Needs  . Financial resource strain: Somewhat hard  . Food insecurity:    Worry: Never true    Inability: Never true  . Transportation needs:    Medical: No    Non-medical: No  Tobacco Use  . Smoking status: Former Smoker    Packs/day: 0.10    Years: 56.00    Pack years: 5.60    Types: Cigarettes    Last attempt to quit: 2019    Years since quitting: 0.8  . Smokeless tobacco: Never Used  Substance and Sexual Activity  . Alcohol use: No    Alcohol/week: 0.0 standard drinks  . Drug use: No  . Sexual activity: Not on file  Lifestyle  .  Physical activity:    Days per week: Not on file    Minutes per session: Not on file  . Stress: Not on file  Relationships  . Social connections:    Talks on phone: Not on file    Gets together: Not on file    Attends religious service: Not on file    Active member of club or organization: Not on file    Attends meetings of clubs or organizations: Not on file    Relationship status: Not on file  . Intimate partner violence:  Fear of current or ex partner: Not on file    Emotionally abused: Not on file    Physically abused: Not on file    Forced sexual activity: Not on file  Other Topics Concern  . Not on file  Social History Narrative   Patient reports it being difficult to pay for everything due to outstanding medical bills from hospital stay last year but states she is able to keep up with basic expenses.  Patient does have some concerns with her house's condition due to a water leak- had her roof repaired last month but has some leaking in the front which has affected her porch.  Patient owns her own car and is able to drive herself but has some concerns about the reliability of her car- has gotten taxi to the clinic for appointment in the past when her car broke down.   Family History  Problem Relation Age of Onset  . Cancer Mother   . Heart disease Father    Vitals:   06/07/18 1329  BP: (!) 144/80  Pulse: 81  SpO2: 97%  Weight: 97.6 kg (215 lb 3.2 oz)   Wt Readings from Last 3 Encounters:  06/07/18 97.6 kg (215 lb 3.2 oz)  06/05/18 98.2 kg (216 lb 6.4 oz)  05/30/18 97.3 kg (214 lb 9.6 oz)    PHYSICAL EXAM: General:  Well appearing. No resp difficulty. Walked on the clinic  HEENT: normal Neck: supple. JVP 7--8. Carotids 2+ bilat; no bruits. No lymphadenopathy or thryomegaly appreciated. Cor: PMI nondisplaced. Regular rate & rhythm. No rubs, gallops or murmurs. Lungs: clear Abdomen: soft, nontender, nondistended. No hepatosplenomegaly. No bruits or masses. Good bowel  sounds. Extremities: no cyanosis, clubbing, rash, RLE and LLE trace edema Neuro: alert & orientedx3, cranial nerves grossly intact. moves all 4 extremities w/o difficulty. Affect pleasant  ASSESSMENT & PLAN:  1. Chronic combined CHF - Suspect etiology is combined Ischemic/NICM with Afib/RVR for nearly a year.   - Echo 10/19/17 LVEF 30-35%, Moderate LVH, Mild AI, Moderate regurgitation, Severe LAE, Moderately reduced RV, Severe RAE, PA peak pressure 50 mm Hg. (Decreased from Echo 01/11/2017 with LVEF 50-55%) - TEE 12/2017: EF 20-25%, RV mod to severely reduced -NYHA II Continue lasix 60 mg twice a day.  - Off BB with bradycardia -Continue lower dose of entresto due to patient preference. .    - Increase  spiro 25 mg daily. Check BMET next week.   - Discussed possibility of ICD consideration if EF remains < 35%. She is not sure that she would want to go forward with ICD. -Check ECHO next week.  2. CAD s/p CABG x 2 2016 (LIMA to LAD and SVG to OM) - no s/s ischemia.  - Continue statin. No ASA. On xarelto. - Continue allopurinol 300 mg daily. Recent study published (CARES trial) that showed an increased risk of death with uloric in patients with CAD. No longer on uloric 3. PAF s/p TEE/DCCV on 12/25/17 - Regular pulse.   - CHA2DS2-VASc is at least 5.  - Continue xarelto.  - Off amio with bradycardia  4. HTN -Stable.  5. Tobacco Abuse - Stopped smoking 10/2017. 6. HLD - Per [facility]. No change.  7. Suspected OSA/Snoring - She had a sleep study in May but has not followed up for next test.  8. Social  - She has poor insight into her disease.  - Followed by HF Paramedicine.   Follow up in December with an ECHO and Dr Haroldine Laws  [name], NP 06/07/18

## 2018-06-07 NOTE — Progress Notes (Signed)
Paramedicine Encounter   Patient ID: [name] [name] , female,   DOB: Dec 30, 1945,72 y.o.,  MRN: [id number]   Ms Nickolson was seen in the clinic today with [name], NP-C, and reported feeling well. Per Amy, she is to increase spironolactone 12.5 mg to 25 mg daily due to hypertension and her inability to tolerate a higher dose of Entresto. I will go to her house following the appointment to adjust her pillbox accordingly.   [name], EMT  06/07/2018   ACTION: Next visit planned for 1 hour

## 2018-06-07 NOTE — Progress Notes (Signed)
Pt completed SDOH screening during clinic visit- no concerns identified at this time  CSW will continue to follow patient and assist as needed  Jorge Ny, [place] Worker [facility] [phone]

## 2018-06-07 NOTE — Patient Instructions (Signed)
Today you have been seen at the Heart failure clinic at [facility]   Medication changes: Increase spironolactone 25 mg daily   Scheduled Lab work: BMET in 7 days 06/14/18 at 12:00  We will call you if your lab work is abnormal.. No news is good news!!   Keep  Your Follow up appointment   Do the following things EVERYDAY: 1) Weigh yourself in the morning before breakfast. Write it down and keep it in a log. 2) Take your medicines as prescribed 3) Eat low salt foods-Limit salt (sodium) to 2000 mg per day.  4) Stay as active as you can everyday 5) Limit all fluids for the day to less than 2 liters

## 2018-06-08 ENCOUNTER — Other Ambulatory Visit (HOSPITAL_COMMUNITY): Payer: Self-pay | Admitting: *Deleted

## 2018-06-08 DIAGNOSIS — I5020 Unspecified systolic (congestive) heart failure: Secondary | ICD-10-CM | POA: Diagnosis not present

## 2018-06-08 DIAGNOSIS — I10 Essential (primary) hypertension: Secondary | ICD-10-CM | POA: Diagnosis not present

## 2018-06-08 DIAGNOSIS — M1990 Unspecified osteoarthritis, unspecified site: Secondary | ICD-10-CM | POA: Diagnosis not present

## 2018-06-08 DIAGNOSIS — J449 Chronic obstructive pulmonary disease, unspecified: Secondary | ICD-10-CM | POA: Diagnosis not present

## 2018-06-08 DIAGNOSIS — I25810 Atherosclerosis of coronary artery bypass graft(s) without angina pectoris: Secondary | ICD-10-CM | POA: Diagnosis not present

## 2018-06-08 DIAGNOSIS — N183 Chronic kidney disease, stage 3 (moderate): Secondary | ICD-10-CM | POA: Diagnosis not present

## 2018-06-08 DIAGNOSIS — I214 Non-ST elevation (NSTEMI) myocardial infarction: Secondary | ICD-10-CM | POA: Diagnosis not present

## 2018-06-08 DIAGNOSIS — I509 Heart failure, unspecified: Secondary | ICD-10-CM | POA: Diagnosis not present

## 2018-06-08 DIAGNOSIS — I4891 Unspecified atrial fibrillation: Secondary | ICD-10-CM | POA: Diagnosis not present

## 2018-06-08 MED ORDER — SPIRONOLACTONE 25 MG PO TABS: 25.0000 mg | ORAL_TABLET | Freq: Every day | ORAL | 3 refills | Status: DC

## 2018-06-12 ENCOUNTER — Telehealth (HOSPITAL_COMMUNITY): Payer: Self-pay

## 2018-06-12 ENCOUNTER — Other Ambulatory Visit (HOSPITAL_COMMUNITY): Payer: Self-pay

## 2018-06-12 NOTE — Progress Notes (Signed)
Paramedicine Encounter    Patient ID: [name] [name], female    DOB: [birth year], 72 y.o.   MRN: [id number]   Patient Care Team: [name], MD as PCP - General (Internal Medicine) Debara Pickett Nadean Corwin, MD as PCP - Cardiology (Cardiology) Debara Pickett Nadean Corwin, MD as Consulting Physician (Cardiology)  Patient Active Problem List   Diagnosis Date Noted  . Postmenopausal vaginal bleeding 01/04/2018  . Snoring 01/04/2018  . Acute on chronic combined systolic and diastolic CHF (congestive heart failure) ([place])   . CHF exacerbation ([place]) 10/16/2017  . Noncompliance 10/16/2017  . Acute on chronic congestive heart failure ([place])   . Influenza with respiratory manifestation flu a + 09/14/16 started on tamiflu 09/15/17 09/19/2017  . Thrombocytopenia ([place]) 09/14/2017  . Acute drug-induced gout of right foot   . Medication noncompliance due to cognitive impairment 09/06/2017  . Acute diastolic CHF (congestive heart failure) ([place]) 08/06/2017  . LGI bleed, likely hemorrhoids 08/06/2017  . Atrial fibrillation ([place]) 08/04/2017  . Paroxysmal atrial fibrillation ([place]) 02/08/2017  . Cardiomyopathy, ischemic 02/08/2017  . Dysphagia 02/08/2017  . CKD (chronic kidney disease), stage III ([place]) 01/30/2017  . Pain in shoulder 02/08/2016  . Breast pain, left 02/08/2016  . Bilateral arm numbness and tingling while sleeping 02/08/2016  . Painful lumpy left breast 09/23/2015  . [place] intertrigo 02/11/2015  . S/P CABG x 2 11/14/2014  . Accelerated hypertension   . Cardiac arrest ([place]) 11/04/2014  . Left main coronary artery disease 11/04/2014  . Coronary artery disease due to lipid rich plaque   . Acute respiratory failure with hypoxemia ([place])   . Essential hypertension   . Atrial fibrillation with RVR ([place])   . Hypokalemia 10/30/2014  . Chronic diastolic CHF (congestive heart failure) ([place]) 10/30/2014  . NSTEMI (non-ST elevated myocardial infarction) ([place])   . DOE (dyspnea on exertion)   . CAP (community  acquired pneumonia) 10/29/2014    Current Outpatient Medications:  .  acetaminophen (TYLENOL) 500 MG tablet, Take 500 mg by mouth every 6 (six) hours as needed for moderate pain. , Disp: , Rfl:  .  allopurinol (ZYLOPRIM) 300 MG tablet, Take 1 tablet (300 mg total) by mouth daily., Disp: 30 tablet, Rfl: 11 .  atorvastatin (LIPITOR) 10 MG tablet, Take 10 mg by mouth daily., Disp: , Rfl:  .  budesonide (PULMICORT) 0.5 MG/2ML nebulizer solution, Take 2 mLs (0.5 mg total) by nebulization 2 (two) times daily., Disp: , Rfl: 12 .  furosemide (LASIX) 20 MG tablet, TAKE 3 TABLETS (60 MG TOTAL) BY MOUTH 2 (TWO) TIMES DAILY., Disp: 180 tablet, Rfl: 11 .  magnesium oxide (MAG-OX) 400 MG tablet, Take 1 tablet (400 mg total) by mouth 3 (three) times daily., Disp: 270 tablet, Rfl: 3 .  ranitidine (ZANTAC) 150 MG capsule, Take 150 mg by mouth 2 (two) times daily as needed for heartburn. , Disp: , Rfl:  .  rivaroxaban (XARELTO) 20 MG TABS tablet, Take 1 tablet (20 mg total) by mouth daily with supper., Disp: 30 tablet, Rfl: 11 .  sacubitril-valsartan (ENTRESTO) 24-26 MG, Take 1 tablet by mouth 2 (two) times daily., Disp: 60 tablet, Rfl: 11 .  spironolactone (ALDACTONE) 25 MG tablet, Take 1 tablet (25 mg total) by mouth daily., Disp: 30 tablet, Rfl: 3 .  tizanidine (ZANAFLEX) 2 MG capsule, Take 1 capsule (2 mg total) by mouth at bedtime as needed (back pain)., Disp: 30 capsule, Rfl: 0 .  levalbuterol (XOPENEX) 0.63 MG/3ML nebulizer solution, Take 3 mLs (0.63 mg total)  by nebulization every 6 (six) hours as needed for wheezing or shortness of breath. (Patient not taking: Reported on 06/07/2018), Disp: 3 mL, Rfl: 12 Allergies  Allergen Reactions  . Bee Venom Anaphylaxis  . Codeine Nausea And Vomiting  . Shrimp [Shellfish Allergy] Swelling  . Tomato     Pt states it gives her gout      Social History   Socioeconomic History  . Marital status: Widowed    Spouse name: Not on file  . Number of children: 1  .  Years of education: 77  . Highest education level: Not on file  Occupational History  . Not on file  Social Needs  . Financial resource strain: Not hard at all  . Food insecurity:    Worry: Never true    Inability: Never true  . Transportation needs:    Medical: No    Non-medical: No  Tobacco Use  . Smoking status: Former Smoker    Packs/day: 0.10    Years: 56.00    Pack years: 5.60    Types: Cigarettes    Last attempt to quit: 2019    Years since quitting: 0.8  . Smokeless tobacco: Never Used  Substance and Sexual Activity  . Alcohol use: No    Alcohol/week: 0.0 standard drinks  . Drug use: No  . Sexual activity: Not on file  Lifestyle  . Physical activity:    Days per week: Not on file    Minutes per session: Not on file  . Stress: Not on file  Relationships  . Social connections:    Talks on phone: Not on file    Gets together: Not on file    Attends religious service: Not on file    Active member of club or organization: Not on file    Attends meetings of clubs or organizations: Not on file    Relationship status: Not on file  . Intimate partner violence:    Fear of current or ex partner: Not on file    Emotionally abused: Not on file    Physically abused: Not on file    Forced sexual activity: Not on file  Other Topics Concern  . Not on file  Social History Narrative   Patient reports it being difficult to pay for everything due to outstanding medical bills from hospital stay last year but states she is able to keep up with basic expenses.  Patient does have some concerns with her house's condition due to a water leak- had her roof repaired last month but has some leaking in the front which has affected her porch.  Patient owns her own car and is able to drive herself but has some concerns about the reliability of her car- has gotten taxi to the clinic for appointment in the past when her car broke down.    Physical Exam  Constitutional: She is oriented to  person, place, and time.  Cardiovascular: Normal rate and regular rhythm.  Pulmonary/Chest: Effort normal and breath sounds normal.  Abdominal: Soft.  Musculoskeletal: Normal range of motion. She exhibits edema.  Neurological: She is alert and oriented to person, place, and time.  Skin: Skin is warm and dry.  Psychiatric: She has a normal mood and affect.        Future Appointments  Date Time Provider Rodriguez [place]via  06/14/2018 12:00 PM [facility] LAB [facility] None  08/03/2018 10:00 AM MC ECHO 1-BUZZ [facility]  08/03/2018 11:40 AM Bensimhon, Shaune Pascal, MD [facility] None  BP 136/80 (BP Location: Left Arm, Patient Position: Sitting, Cuff Size: Large)   Pulse 70   Resp 16   Wt 215 lb 9.6 oz (97.8 kg)   SpO2 98%   BMI 33.27 kg/m   Weight yesterday- 214 lb Last visit weight- 215 lb   Ms ruddell was seen at home today and reported feeling well. She stated she has experienced some dizziness over the past week which she associates with the increase in her spironolactone. She stated the dizziness occurs in the morning after she takes her medicine and she wants to know if she can reduce it to the half a tablet she was taking before. I contacted the clinic but did not get an immediate response. She denied SOB, headaches or orthopnea. Her medications were verified and her pillbox was refilled with the exception of spironolactone. I will follow up with her after hearing back from the clinic this afternoon.   [name], EMT 06/12/18  ACTION: Home visit completed Next visit planned for 1 week

## 2018-06-12 NOTE — Telephone Encounter (Signed)
I called [name] [name] to relay information from the HF clinic. Per Oda Kilts, PA-C, she should continue spironolactone at 25 mg daily but she should take it at bedtime to avoid the dizziness she has been feeling. [name] Switalski was understanding and agreeable. I offered to come back and put the spironolactone in her pillbox but she said she was capable of handling it. She has an appointment at the clinic on Thursday and they will reassess then.

## 2018-06-12 NOTE — Telephone Encounter (Signed)
I called [name] [name] to schedule an appointment. She stated she had to be out running errands this morning but would be home by 13:00. We agreed to meet at 13:30.

## 2018-06-12 NOTE — Telephone Encounter (Signed)
Left VM for pt 

## 2018-06-12 NOTE — Telephone Encounter (Signed)
Zach (paramedicine) called and stated that pt is having dizziness with increased spiro and pt would like to know if she can decrease spiro back to what she was taking? Please advise.

## 2018-06-12 NOTE — Telephone Encounter (Signed)
BP was still elevated at Paramedicine visit. If she can tolerate, would continue for now, and check orthostatics when she comes for labs 10/31.    Legrand Como [address]" [place], PA-C 06/12/2018 2:50 PM

## 2018-06-14 ENCOUNTER — Ambulatory Visit (HOSPITAL_COMMUNITY)
Admission: RE | Admit: 2018-06-14 | Discharge: 2018-06-14 | Disposition: A | Discharge status: Home / Self Care | Payer: PPO | Source: Ambulatory Visit | Attending: Internal Medicine | Admitting: Internal Medicine

## 2018-06-14 DIAGNOSIS — I5032 Chronic diastolic (congestive) heart failure: Secondary | ICD-10-CM | POA: Diagnosis not present

## 2018-06-14 LAB — BASIC METABOLIC PANEL
Anion gap: 9 (ref 5–15)
BUN: 23 mg/dL (ref 8–23)
CO2: 23 mmol/L (ref 22–32)
Calcium: 9.7 mg/dL (ref 8.9–10.3)
Chloride: 109 mmol/L (ref 98–111)
Creatinine, Ser: 1.37 mg/dL — ABNORMAL HIGH (ref 0.44–1.00)
GFR calc Af Amer: 43 mL/min — ABNORMAL LOW (ref >60)
GFR calc non Af Amer: 37 mL/min — ABNORMAL LOW (ref >60)
Glucose, Bld: 111 mg/dL — ABNORMAL HIGH (ref 70–99)
Potassium: 4.1 mmol/L (ref 3.5–5.1)
Sodium: 141 mmol/L (ref 135–145)

## 2018-06-20 ENCOUNTER — Telehealth (HOSPITAL_COMMUNITY): Payer: Self-pay

## 2018-06-20 ENCOUNTER — Other Ambulatory Visit (HOSPITAL_COMMUNITY): Payer: Self-pay

## 2018-06-20 NOTE — Progress Notes (Signed)
Paramedicine Encounter    Patient ID: [name] [name], female    DOB: Nov 04, 1945, 72 y.o.   MRN: [id number]   Patient Care Team: [name], MD as PCP - General (Internal Medicine) Debara Pickett Nadean Corwin, MD as PCP - Cardiology (Cardiology) Debara Pickett Nadean Corwin, MD as Consulting Physician (Cardiology)  Patient Active Problem List   Diagnosis Date Noted  . Postmenopausal vaginal bleeding 01/04/2018  . Snoring 01/04/2018  . Acute on chronic combined systolic and diastolic CHF (congestive heart failure) ([place])   . CHF exacerbation ([place]) 10/16/2017  . Noncompliance 10/16/2017  . Acute on chronic congestive heart failure ([place])   . Influenza with respiratory manifestation flu a + 09/14/16 started on tamiflu 09/15/17 09/19/2017  . Thrombocytopenia ([place]) 09/14/2017  . Acute drug-induced gout of right foot   . Medication noncompliance due to cognitive impairment 09/06/2017  . Acute diastolic CHF (congestive heart failure) ([place]) 08/06/2017  . LGI bleed, likely hemorrhoids 08/06/2017  . Atrial fibrillation ([place]) 08/04/2017  . Paroxysmal atrial fibrillation ([place]) 02/08/2017  . Cardiomyopathy, ischemic 02/08/2017  . Dysphagia 02/08/2017  . CKD (chronic kidney disease), stage III ([place]) 01/30/2017  . Pain in shoulder 02/08/2016  . Breast pain, left 02/08/2016  . Bilateral arm numbness and tingling while sleeping 02/08/2016  . Painful lumpy left breast 09/23/2015  . Candidal intertrigo 02/11/2015  . S/P CABG x 2 11/14/2014  . Accelerated hypertension   . Cardiac arrest ([place]) 11/04/2014  . Left main coronary artery disease 11/04/2014  . Coronary artery disease due to lipid rich plaque   . Acute respiratory failure with hypoxemia ([place])   . Essential hypertension   . Atrial fibrillation with RVR ([place])   . Hypokalemia 10/30/2014  . Chronic diastolic CHF (congestive heart failure) ([place]) 10/30/2014  . NSTEMI (non-ST elevated myocardial infarction) ([place])   . DOE (dyspnea on exertion)   . CAP (community  acquired pneumonia) 10/29/2014    Current Outpatient Medications:  .  acetaminophen (TYLENOL) 500 MG tablet, Take 500 mg by mouth every 6 (six) hours as needed for moderate pain. , Disp: , Rfl:  .  allopurinol (ZYLOPRIM) 300 MG tablet, Take 1 tablet (300 mg total) by mouth daily., Disp: 30 tablet, Rfl: 11 .  atorvastatin (LIPITOR) 10 MG tablet, Take 10 mg by mouth daily., Disp: , Rfl:  .  budesonide (PULMICORT) 0.5 MG/2ML nebulizer solution, Take 2 mLs (0.5 mg total) by nebulization 2 (two) times daily., Disp: , Rfl: 12 .  furosemide (LASIX) 20 MG tablet, TAKE 3 TABLETS (60 MG TOTAL) BY MOUTH 2 (TWO) TIMES DAILY., Disp: 180 tablet, Rfl: 11 .  magnesium oxide (MAG-OX) 400 MG tablet, Take 1 tablet (400 mg total) by mouth 3 (three) times daily., Disp: 270 tablet, Rfl: 3 .  ranitidine (ZANTAC) 150 MG capsule, Take 150 mg by mouth 2 (two) times daily as needed for heartburn. , Disp: , Rfl:  .  rivaroxaban (XARELTO) 20 MG TABS tablet, Take 1 tablet (20 mg total) by mouth daily with supper., Disp: 30 tablet, Rfl: 11 .  sacubitril-valsartan (ENTRESTO) 24-26 MG, Take 1 tablet by mouth 2 (two) times daily., Disp: 60 tablet, Rfl: 11 .  spironolactone (ALDACTONE) 25 MG tablet, Take 1 tablet (25 mg total) by mouth daily., Disp: 30 tablet, Rfl: 3 .  levalbuterol (XOPENEX) 0.63 MG/3ML nebulizer solution, Take 3 mLs (0.63 mg total) by nebulization every 6 (six) hours as needed for wheezing or shortness of breath. (Patient not taking: Reported on 06/07/2018), Disp: 3 mL, Rfl: 12 .  tizanidine (ZANAFLEX) 2 MG capsule, Take 1 capsule (2 mg total) by mouth at bedtime as needed (back pain). (Patient not taking: Reported on 06/20/2018), Disp: 30 capsule, Rfl: 0 Allergies  Allergen Reactions  . Bee Venom Anaphylaxis  . Codeine Nausea And Vomiting  . Shrimp [Shellfish Allergy] Swelling  . Tomato     Pt states it gives her gout      Social History   Socioeconomic History  . Marital status: Widowed    Spouse  name: Not on file  . Number of children: 1  . Years of education: 82  . Highest education level: Not on file  Occupational History  . Not on file  Social Needs  . Financial resource strain: Not hard at all  . Food insecurity:    Worry: Never true    Inability: Never true  . Transportation needs:    Medical: No    Non-medical: No  Tobacco Use  . Smoking status: Former Smoker    Packs/day: 0.10    Years: 56.00    Pack years: 5.60    Types: Cigarettes    Last attempt to quit: 2019    Years since quitting: 0.8  . Smokeless tobacco: Never Used  Substance and Sexual Activity  . Alcohol use: No    Alcohol/week: 0.0 standard drinks  . Drug use: No  . Sexual activity: Not on file  Lifestyle  . Physical activity:    Days per week: Not on file    Minutes per session: Not on file  . Stress: Not on file  Relationships  . Social connections:    Talks on phone: Not on file    Gets together: Not on file    Attends religious service: Not on file    Active member of club or organization: Not on file    Attends meetings of clubs or organizations: Not on file    Relationship status: Not on file  . Intimate partner violence:    Fear of current or ex partner: Not on file    Emotionally abused: Not on file    Physically abused: Not on file    Forced sexual activity: Not on file  Other Topics Concern  . Not on file  Social History Narrative   Patient reports it being difficult to pay for everything due to outstanding medical bills from hospital stay last year but states she is able to keep up with basic expenses.  Patient does have some concerns with her house's condition due to a water leak- had her roof repaired last month but has some leaking in the front which has affected her porch.  Patient owns her own car and is able to drive herself but has some concerns about the reliability of her car- has gotten taxi to the clinic for appointment in the past when her car broke down.     Physical Exam  Constitutional: She is oriented to person, place, and time.  Cardiovascular: Normal rate and regular rhythm.  Pulmonary/Chest: Effort normal and breath sounds normal.  Abdominal: Soft.  Musculoskeletal: Normal range of motion. She exhibits edema.  Neurological: She is alert and oriented to person, place, and time.  Skin: Skin is warm and dry.  Psychiatric: She has a normal mood and affect.        Future Appointments  Date Time Provider [place]  08/03/2018 10:00 AM MC ECHO 1-BUZZ [facility]  08/03/2018 11:40 AM Bensimhon, Shaune Pascal, MD MC-[facility] None    BP  138/84 (BP Location: Left Arm, Patient Position: Sitting, Cuff Size: Large)   Pulse 66   Resp 16   Wt 214 lb 3.2 oz (97.2 kg)   SpO2 98%   BMI 33.05 kg/m   Weight yesterday- 214 lb Last visit weight- 215.6 lb  Ms Szczerba was seen at home today and reported feeling well. She denied SOB, headache, dizziness or orthopnea. She reported being compliant with her medications over the past week and her weights have been stable. Her medications were verified and her pillbox was refilled. She was ran out of spironolactone and entresto but I contacted her pharmacy and the medications will be delivered tomorrow. I will return then the finish her pillbox.   [name], EMT 06/20/18  ACTION: Home visit completed Next visit planned for Tomorrow

## 2018-06-21 NOTE — Telephone Encounter (Signed)
I called [name] [name] to schedule an appointment. She stated she would be available for a visit this morning. We agreed to meet at 10:30

## 2018-06-22 ENCOUNTER — Other Ambulatory Visit (HOSPITAL_COMMUNITY): Payer: Self-pay

## 2018-06-22 NOTE — Progress Notes (Signed)
[name] [name] was seen at home today. The purpose of todays visit was to refill her pillbox since she had several medications delivered yesterday. No changes were made and nothing further was necessary.

## 2018-06-25 ENCOUNTER — Other Ambulatory Visit (HOSPITAL_COMMUNITY): Payer: PPO

## 2018-06-25 ENCOUNTER — Encounter (HOSPITAL_COMMUNITY): Payer: PPO | Admitting: Internal Medicine

## 2018-06-27 ENCOUNTER — Telehealth (HOSPITAL_COMMUNITY): Payer: Self-pay

## 2018-06-28 ENCOUNTER — Other Ambulatory Visit (HOSPITAL_COMMUNITY): Payer: Self-pay

## 2018-06-28 NOTE — Progress Notes (Signed)
Paramedicine Encounter    Patient ID: [name] [name], female    DOB: 08-10-1946, 72 y.o.   MRN: [id number]   Patient Care Team: [name], MD as PCP - General (Internal Medicine) Debara Pickett Nadean Corwin, MD as PCP - Cardiology (Cardiology) Debara Pickett Nadean Corwin, MD as Consulting Physician (Cardiology)  Patient Active Problem List   Diagnosis Date Noted  . Postmenopausal vaginal bleeding 01/04/2018  . Snoring 01/04/2018  . Acute on chronic combined systolic and diastolic CHF (congestive heart failure) ([place])   . CHF exacerbation ([place]) 10/16/2017  . Noncompliance 10/16/2017  . Acute on chronic congestive heart failure ([place])   . Influenza with respiratory manifestation flu a + 09/14/16 started on tamiflu 09/15/17 09/19/2017  . Thrombocytopenia ([place]) 09/14/2017  . Acute drug-induced gout of right foot   . Medication noncompliance due to cognitive impairment 09/06/2017  . Acute diastolic CHF (congestive heart failure) ([place]) 08/06/2017  . LGI bleed, likely hemorrhoids 08/06/2017  . Atrial fibrillation ([place]) 08/04/2017  . Paroxysmal atrial fibrillation ([place]) 02/08/2017  . Cardiomyopathy, ischemic 02/08/2017  . Dysphagia 02/08/2017  . CKD (chronic kidney disease), stage III ([place]) 01/30/2017  . Pain in shoulder 02/08/2016  . Breast pain, left 02/08/2016  . Bilateral arm numbness and tingling while sleeping 02/08/2016  . Painful lumpy left breast 09/23/2015  . Candidal intertrigo 02/11/2015  . S/P CABG x 2 11/14/2014  . Accelerated hypertension   . Cardiac arrest ([place]) 11/04/2014  . Left main coronary artery disease 11/04/2014  . Coronary artery disease due to lipid rich plaque   . Acute respiratory failure with hypoxemia ([place])   . Essential hypertension   . Atrial fibrillation with RVR ([place])   . Hypokalemia 10/30/2014  . Chronic diastolic CHF (congestive heart failure) ([place]) 10/30/2014  . NSTEMI (non-ST elevated myocardial infarction) ([place])   . DOE (dyspnea on exertion)   . CAP (community  acquired pneumonia) 10/29/2014    Current Outpatient Medications:  .  allopurinol (ZYLOPRIM) 300 MG tablet, Take 1 tablet (300 mg total) by mouth daily., Disp: 30 tablet, Rfl: 11 .  atorvastatin (LIPITOR) 10 MG tablet, Take 10 mg by mouth daily., Disp: , Rfl:  .  budesonide (PULMICORT) 0.5 MG/2ML nebulizer solution, Take 2 mLs (0.5 mg total) by nebulization 2 (two) times daily., Disp: , Rfl: 12 .  furosemide (LASIX) 20 MG tablet, TAKE 3 TABLETS (60 MG TOTAL) BY MOUTH 2 (TWO) TIMES DAILY., Disp: 180 tablet, Rfl: 11 .  ranitidine (ZANTAC) 150 MG capsule, Take 150 mg by mouth 2 (two) times daily as needed for heartburn. , Disp: , Rfl:  .  rivaroxaban (XARELTO) 20 MG TABS tablet, Take 1 tablet (20 mg total) by mouth daily with supper., Disp: 30 tablet, Rfl: 11 .  sacubitril-valsartan (ENTRESTO) 24-26 MG, Take 1 tablet by mouth 2 (two) times daily., Disp: 60 tablet, Rfl: 11 .  spironolactone (ALDACTONE) 25 MG tablet, Take 1 tablet (25 mg total) by mouth daily., Disp: 30 tablet, Rfl: 3 .  tizanidine (ZANAFLEX) 2 MG capsule, Take 1 capsule (2 mg total) by mouth at bedtime as needed (back pain)., Disp: 30 capsule, Rfl: 0 .  acetaminophen (TYLENOL) 500 MG tablet, Take 500 mg by mouth every 6 (six) hours as needed for moderate pain. , Disp: , Rfl:  .  levalbuterol (XOPENEX) 0.63 MG/3ML nebulizer solution, Take 3 mLs (0.63 mg total) by nebulization every 6 (six) hours as needed for wheezing or shortness of breath. (Patient not taking: Reported on 06/07/2018), Disp: 3 mL, Rfl: 12 .  magnesium oxide (MAG-OX) 400 MG tablet, Take 1 tablet (400 mg total) by mouth 3 (three) times daily., Disp: 270 tablet, Rfl: 3 Allergies  Allergen Reactions  . Bee Venom Anaphylaxis  . Codeine Nausea And Vomiting  . Shrimp [Shellfish Allergy] Swelling  . Tomato     Pt states it gives her gout      Social History   Socioeconomic History  . Marital status: Widowed    Spouse name: Not on file  . Number of children: 1  .  Years of education: 50  . Highest education level: Not on file  Occupational History  . Not on file  Social Needs  . Financial resource strain: Not hard at all  . Food insecurity:    Worry: Never true    Inability: Never true  . Transportation needs:    Medical: No    Non-medical: No  Tobacco Use  . Smoking status: Former Smoker    Packs/day: 0.10    Years: 56.00    Pack years: 5.60    Types: Cigarettes    Last attempt to quit: 2019    Years since quitting: 0.8  . Smokeless tobacco: Never Used  Substance and Sexual Activity  . Alcohol use: No    Alcohol/week: 0.0 standard drinks  . Drug use: No  . Sexual activity: Not on file  Lifestyle  . Physical activity:    Days per week: Not on file    Minutes per session: Not on file  . Stress: Not on file  Relationships  . Social connections:    Talks on phone: Not on file    Gets together: Not on file    Attends religious service: Not on file    Active member of club or organization: Not on file    Attends meetings of clubs or organizations: Not on file    Relationship status: Not on file  . Intimate partner violence:    Fear of current or ex partner: Not on file    Emotionally abused: Not on file    Physically abused: Not on file    Forced sexual activity: Not on file  Other Topics Concern  . Not on file  Social History Narrative   Patient reports it being difficult to pay for everything due to outstanding medical bills from hospital stay last year but states she is able to keep up with basic expenses.  Patient does have some concerns with her house's condition due to a water leak- had her roof repaired last month but has some leaking in the front which has affected her porch.  Patient owns her own car and is able to drive herself but has some concerns about the reliability of her car- has gotten taxi to the clinic for appointment in the past when her car broke down.    Physical Exam  Constitutional: She is oriented to  person, place, and time.  Cardiovascular: Normal rate and regular rhythm.  Pulmonary/Chest: Effort normal and breath sounds normal.  Abdominal: Soft.  Musculoskeletal: Normal range of motion. She exhibits no edema.  Neurological: She is alert and oriented to person, place, and time.  Skin: Skin is warm and dry.  Psychiatric: She has a normal mood and affect.        Future Appointments  Date Time Provider [place]  08/03/2018 10:00 AM MC ECHO 1-BUZZ [facility]ECH[facility]  08/03/2018 11:40 AM Bensimhon, Shaune Pascal, MD MC-[facility] None    BP (!) 144/86 (BP Location: Left Arm,  Patient Position: Sitting, Cuff Size: Large)   Pulse 70   Resp 16   Wt 212 lb 6.4 oz (96.3 kg)   SpO2 98%   BMI 32.78 kg/m   Weight yesterday- 214.6 lb Last visit weight- 214.2 lb  Ms Buttermore was seen at home today and reported feeling well. She denied chest pain, SOB, headache, dizziness or orthopnea. She did say that she has been waking up with pain in her back and passing into her chest intermittently which is an issue which she complained about previously and said she believed it was because her "medications are too strong." Several weeks ago she stated that the pain stopped after the clinic decreased her Entresto. I have spoken to the clinic about this on multiple occasions and they have assured me that none of her medications are responsible for this pain. I have relayed this information to her on multiple occasions however she insists it is the medicine but agrees to keep taking it. Her medication was verified and her pillbox was refilled.   [name], EMT 06/28/18  ACTION: Home visit completed Next visit planned for 1 week

## 2018-06-28 NOTE — Telephone Encounter (Signed)
I called Ms Glanzer to schedule an appointment. She stated she would be home tomorrow morning but did not want to meet too early because it would be cold. We agreed to meet at 11:15.

## 2018-07-05 ENCOUNTER — Telehealth (HOSPITAL_COMMUNITY): Payer: Self-pay

## 2018-07-05 NOTE — Telephone Encounter (Signed)
I called [name] [name] to schedule an appointment. She stated tomorrow at 66 or 12 would work well for her.

## 2018-07-06 ENCOUNTER — Telehealth (HOSPITAL_COMMUNITY): Payer: Self-pay

## 2018-07-06 ENCOUNTER — Other Ambulatory Visit (HOSPITAL_COMMUNITY): Payer: Self-pay

## 2018-07-06 DIAGNOSIS — I25810 Atherosclerosis of coronary artery bypass graft(s) without angina pectoris: Secondary | ICD-10-CM | POA: Diagnosis not present

## 2018-07-06 DIAGNOSIS — M1990 Unspecified osteoarthritis, unspecified site: Secondary | ICD-10-CM | POA: Diagnosis not present

## 2018-07-06 DIAGNOSIS — J449 Chronic obstructive pulmonary disease, unspecified: Secondary | ICD-10-CM | POA: Diagnosis not present

## 2018-07-06 DIAGNOSIS — N183 Chronic kidney disease, stage 3 (moderate): Secondary | ICD-10-CM | POA: Diagnosis not present

## 2018-07-06 DIAGNOSIS — I214 Non-ST elevation (NSTEMI) myocardial infarction: Secondary | ICD-10-CM | POA: Diagnosis not present

## 2018-07-06 DIAGNOSIS — I509 Heart failure, unspecified: Secondary | ICD-10-CM | POA: Diagnosis not present

## 2018-07-06 DIAGNOSIS — I10 Essential (primary) hypertension: Secondary | ICD-10-CM | POA: Diagnosis not present

## 2018-07-06 DIAGNOSIS — I4891 Unspecified atrial fibrillation: Secondary | ICD-10-CM | POA: Diagnosis not present

## 2018-07-06 MED ORDER — LOSARTAN POTASSIUM 25 MG PO TABS: 25.0000 mg | ORAL_TABLET | Freq: Two times a day (BID) | ORAL | 3 refills | Status: DC

## 2018-07-06 NOTE — Telephone Encounter (Signed)
I called [name] [name] to advise her that I spoke with [name] Kilts, PA-C, who advised that since she is refusing to take the [facility] as directed, then she will be switched to a different medication. The new Rx is Losartan 25 mg BID. [name] [name] was made aware and the Rx was sent to her pharmacy. It will be delivered on Monday so she was instructed to continue taking Entresto until her new medication arrives and I will come back on Monday to make the change. She was understanding and agreeable.

## 2018-07-06 NOTE — Progress Notes (Signed)
Stopped entresto and started losartan po bid per Burkburnett, PA. Zach from paramedicine made pt aware via phone call.

## 2018-07-06 NOTE — Progress Notes (Signed)
Paramedicine Encounter    Patient ID: [name] [name], female    DOB: [birth year], 72 y.o.   MRN: [id number]   Patient Care Team: [name], MD as PCP - General (Internal Medicine) Debara Pickett Nadean Corwin, MD as PCP - Cardiology (Cardiology) Debara Pickett Nadean Corwin, MD as Consulting Physician (Cardiology)  Patient Active Problem List   Diagnosis Date Noted  . Postmenopausal vaginal bleeding 01/04/2018  . Snoring 01/04/2018  . Acute on chronic combined systolic and diastolic CHF (congestive heart failure) ([place])   . CHF exacerbation ([place]) 10/16/2017  . Noncompliance 10/16/2017  . Acute on chronic congestive heart failure ([place])   . Influenza with respiratory manifestation flu a + 09/14/16 started on tamiflu 09/15/17 09/19/2017  . Thrombocytopenia ([place]) 09/14/2017  . Acute drug-induced gout of right foot   . Medication noncompliance due to cognitive impairment 09/06/2017  . Acute diastolic CHF (congestive heart failure) ([place]) 08/06/2017  . LGI bleed, likely hemorrhoids 08/06/2017  . Atrial fibrillation ([place]) 08/04/2017  . Paroxysmal atrial fibrillation ([place]) 02/08/2017  . Cardiomyopathy, ischemic 02/08/2017  . Dysphagia 02/08/2017  . CKD (chronic kidney disease), stage III ([place]) 01/30/2017  . Pain in shoulder 02/08/2016  . Breast pain, left 02/08/2016  . Bilateral arm numbness and tingling while sleeping 02/08/2016  . Painful lumpy left breast 09/23/2015  . Candidal intertrigo 02/11/2015  . S/P CABG x 2 11/14/2014  . Accelerated hypertension   . Cardiac arrest ([place]) 11/04/2014  . Left main coronary artery disease 11/04/2014  . Coronary artery disease due to lipid rich plaque   . Acute respiratory failure with hypoxemia ([place])   . Essential hypertension   . Atrial fibrillation with RVR ([place])   . Hypokalemia 10/30/2014  . Chronic diastolic CHF (congestive heart failure) ([place]) 10/30/2014  . NSTEMI (non-ST elevated myocardial infarction) ([place])   . DOE (dyspnea on exertion)   . CAP (community  acquired pneumonia) 10/29/2014    Current Outpatient Medications:  .  acetaminophen (TYLENOL) 500 MG tablet, Take 500 mg by mouth every 6 (six) hours as needed for moderate pain. , Disp: , Rfl:  .  allopurinol (ZYLOPRIM) 300 MG tablet, Take 1 tablet (300 mg total) by mouth daily., Disp: 30 tablet, Rfl: 11 .  atorvastatin (LIPITOR) 10 MG tablet, Take 10 mg by mouth daily., Disp: , Rfl:  .  budesonide (PULMICORT) 0.5 MG/2ML nebulizer solution, Take 2 mLs (0.5 mg total) by nebulization 2 (two) times daily., Disp: , Rfl: 12 .  furosemide (LASIX) 20 MG tablet, TAKE 3 TABLETS (60 MG TOTAL) BY MOUTH 2 (TWO) TIMES DAILY., Disp: 180 tablet, Rfl: 11 .  magnesium oxide (MAG-OX) 400 MG tablet, Take 1 tablet (400 mg total) by mouth 3 (three) times daily., Disp: 270 tablet, Rfl: 3 .  ranitidine (ZANTAC) 150 MG capsule, Take 150 mg by mouth 2 (two) times daily as needed for heartburn. , Disp: , Rfl:  .  rivaroxaban (XARELTO) 20 MG TABS tablet, Take 1 tablet (20 mg total) by mouth daily with supper., Disp: 30 tablet, Rfl: 11 .  sacubitril-valsartan (ENTRESTO) 24-26 MG, Take 1 tablet by mouth 2 (two) times daily., Disp: 60 tablet, Rfl: 11 .  spironolactone (ALDACTONE) 25 MG tablet, Take 1 tablet (25 mg total) by mouth daily., Disp: 30 tablet, Rfl: 3 .  tizanidine (ZANAFLEX) 2 MG capsule, Take 1 capsule (2 mg total) by mouth at bedtime as needed (back pain)., Disp: 30 capsule, Rfl: 0 .  levalbuterol (XOPENEX) 0.63 MG/3ML nebulizer solution, Take 3 mLs (0.63 mg total)  by nebulization every 6 (six) hours as needed for wheezing or shortness of breath. (Patient not taking: Reported on 06/07/2018), Disp: 3 mL, Rfl: 12 Allergies  Allergen Reactions  . Bee Venom Anaphylaxis  . Codeine Nausea And Vomiting  . Shrimp [Shellfish Allergy] Swelling  . Tomato     Pt states it gives her gout      Social History   Socioeconomic History  . Marital status: Widowed    Spouse name: Not on file  . Number of children: 1  .  Years of education: 70  . Highest education level: Not on file  Occupational History  . Not on file  Social Needs  . Financial resource strain: Not hard at all  . Food insecurity:    Worry: Never true    Inability: Never true  . Transportation needs:    Medical: No    Non-medical: No  Tobacco Use  . Smoking status: Former Smoker    Packs/day: 0.10    Years: 56.00    Pack years: 5.60    Types: Cigarettes    Last attempt to quit: 2019    Years since quitting: 0.8  . Smokeless tobacco: Never Used  Substance and Sexual Activity  . Alcohol use: No    Alcohol/week: 0.0 standard drinks  . Drug use: No  . Sexual activity: Not on file  Lifestyle  . Physical activity:    Days per week: Not on file    Minutes per session: Not on file  . Stress: Not on file  Relationships  . Social connections:    Talks on phone: Not on file    Gets together: Not on file    Attends religious service: Not on file    Active member of club or organization: Not on file    Attends meetings of clubs or organizations: Not on file    Relationship status: Not on file  . Intimate partner violence:    Fear of current or ex partner: Not on file    Emotionally abused: Not on file    Physically abused: Not on file    Forced sexual activity: Not on file  Other Topics Concern  . Not on file  Social History Narrative   Patient reports it being difficult to pay for everything due to outstanding medical bills from hospital stay last year but states she is able to keep up with basic expenses.  Patient does have some concerns with her house's condition due to a water leak- had her roof repaired last month but has some leaking in the front which has affected her porch.  Patient owns her own car and is able to drive herself but has some concerns about the reliability of her car- has gotten taxi to the clinic for appointment in the past when her car broke down.    Physical Exam  Constitutional: She is oriented to  person, place, and time.  Cardiovascular: Normal rate and regular rhythm.  Pulmonary/Chest: Effort normal and breath sounds normal.  Abdominal: Soft. Bowel sounds are normal.  Musculoskeletal: Normal range of motion. She exhibits edema.  Neurological: She is alert and oriented to person, place, and time.  Skin: Skin is warm and dry.  Psychiatric: She has a normal mood and affect.        Future Appointments  Date Time Provider [place]  08/03/2018 10:00 AM MC ECHO 1-BUZZ [facility]ECHOLAB [facility]  08/03/2018 11:40 AM Bensimhon, Shaune Pascal, MD MC-[facility] None    BP [place] Kitchen)  144/90 (BP Location: Left Arm, Patient Position: Sitting, Cuff Size: Large)   Pulse 80   Resp 16   Wt 214 lb (97.1 kg)   SpO2 96%   BMI 33.02 kg/m   Weight yesterday- 214 lb Last visit weight-   Ms Baum was seen at home today and continued to complain about intermittent chest pressure which she ascribes to entresto. As a result of this, she has been cutting her entresto in half despite having been told explicitly that she should not cut that pill. This has been an ongoing issue with Ms Bartosiewicz so I will bring it up with a provider at the HF clinic for guidance. Her medications were verified and her pillbox was refilled.   [name], EMT 07/06/18  ACTION: Home visit completed Next visit planned for 1 week

## 2018-07-09 ENCOUNTER — Other Ambulatory Visit (HOSPITAL_COMMUNITY): Payer: Self-pay

## 2018-07-09 ENCOUNTER — Telehealth (HOSPITAL_COMMUNITY): Payer: Self-pay

## 2018-07-09 NOTE — Progress Notes (Signed)
Paramedicine Encounter    Patient ID: LAEKYN RAYOS, female    DOB: 06-21-1946, 72 y.o.   MRN: [id number]   Patient Care Team: [name], MD as PCP - General (Internal Medicine) Debara Pickett Nadean Corwin, MD as PCP - Cardiology (Cardiology) Debara Pickett Nadean Corwin, MD as Consulting Physician (Cardiology)  Patient Active Problem List   Diagnosis Date Noted  . Postmenopausal vaginal bleeding 01/04/2018  . Snoring 01/04/2018  . Acute on chronic combined systolic and diastolic CHF (congestive heart failure) ([place])   . CHF exacerbation ([place]) 10/16/2017  . Noncompliance 10/16/2017  . Acute on chronic congestive heart failure ([place])   . Influenza with respiratory manifestation flu a + 09/14/16 started on tamiflu 09/15/17 09/19/2017  . Thrombocytopenia ([place]) 09/14/2017  . Acute drug-induced gout of right foot   . Medication noncompliance due to cognitive impairment 09/06/2017  . Acute diastolic CHF (congestive heart failure) ([place]) 08/06/2017  . LGI bleed, likely hemorrhoids 08/06/2017  . Atrial fibrillation ([place]) 08/04/2017  . Paroxysmal atrial fibrillation ([place]) 02/08/2017  . Cardiomyopathy, ischemic 02/08/2017  . Dysphagia 02/08/2017  . CKD (chronic kidney disease), stage III ([place]) 01/30/2017  . Pain in shoulder 02/08/2016  . Breast pain, left 02/08/2016  . Bilateral arm numbness and tingling while sleeping 02/08/2016  . Painful lumpy left breast 09/23/2015  . Candidal intertrigo 02/11/2015  . S/P CABG x 2 11/14/2014  . Accelerated hypertension   . Cardiac arrest ([place]) 11/04/2014  . Left main coronary artery disease 11/04/2014  . Coronary artery disease due to lipid rich plaque   . Acute respiratory failure with hypoxemia ([place])   . Essential hypertension   . Atrial fibrillation with RVR ([place])   . Hypokalemia 10/30/2014  . Chronic diastolic CHF (congestive heart failure) ([place]) 10/30/2014  . NSTEMI (non-ST elevated myocardial infarction) ([place])   . DOE (dyspnea on exertion)   . CAP (community  acquired pneumonia) 10/29/2014    Current Outpatient Medications:  .  acetaminophen (TYLENOL) 500 MG tablet, Take 500 mg by mouth every 6 (six) hours as needed for moderate pain. , Disp: , Rfl:  .  allopurinol (ZYLOPRIM) 300 MG tablet, Take 1 tablet (300 mg total) by mouth daily., Disp: 30 tablet, Rfl: 11 .  atorvastatin (LIPITOR) 10 MG tablet, Take 10 mg by mouth daily., Disp: , Rfl:  .  budesonide (PULMICORT) 0.5 MG/2ML nebulizer solution, Take 2 mLs (0.5 mg total) by nebulization 2 (two) times daily., Disp: , Rfl: 12 .  furosemide (LASIX) 20 MG tablet, TAKE 3 TABLETS (60 MG TOTAL) BY MOUTH 2 (TWO) TIMES DAILY., Disp: 180 tablet, Rfl: 11 .  losartan (COZAAR) 25 MG tablet, Take 1 tablet (25 mg total) by mouth 2 (two) times daily., Disp: 90 tablet, Rfl: 3 .  magnesium oxide (MAG-OX) 400 MG tablet, Take 1 tablet (400 mg total) by mouth 3 (three) times daily., Disp: 270 tablet, Rfl: 3 .  ranitidine (ZANTAC) 150 MG capsule, Take 150 mg by mouth 2 (two) times daily as needed for heartburn. , Disp: , Rfl:  .  rivaroxaban (XARELTO) 20 MG TABS tablet, Take 1 tablet (20 mg total) by mouth daily with supper., Disp: 30 tablet, Rfl: 11 .  spironolactone (ALDACTONE) 25 MG tablet, Take 1 tablet (25 mg total) by mouth daily., Disp: 30 tablet, Rfl: 3 .  tizanidine (ZANAFLEX) 2 MG capsule, Take 1 capsule (2 mg total) by mouth at bedtime as needed (back pain)., Disp: 30 capsule, Rfl: 0 .  levalbuterol (XOPENEX) 0.63 MG/3ML nebulizer solution, Take 3  mLs (0.63 mg total) by nebulization every 6 (six) hours as needed for wheezing or shortness of breath. (Patient not taking: Reported on 06/07/2018), Disp: 3 mL, Rfl: 12 Allergies  Allergen Reactions  . Bee Venom Anaphylaxis  . Codeine Nausea And Vomiting  . Shrimp [Shellfish Allergy] Swelling  . Tomato     Pt states it gives her gout      Social History   Socioeconomic History  . Marital status: Widowed    Spouse name: Not on file  . Number of children: 1   . Years of education: 65  . Highest education level: Not on file  Occupational History  . Not on file  Social Needs  . Financial resource strain: Not hard at all  . Food insecurity:    Worry: Never true    Inability: Never true  . Transportation needs:    Medical: No    Non-medical: No  Tobacco Use  . Smoking status: Former Smoker    Packs/day: 0.10    Years: 56.00    Pack years: 5.60    Types: Cigarettes    Last attempt to quit: 2019    Years since quitting: 0.8  . Smokeless tobacco: Never Used  Substance and Sexual Activity  . Alcohol use: No    Alcohol/week: 0.0 standard drinks  . Drug use: No  . Sexual activity: Not on file  Lifestyle  . Physical activity:    Days per week: Not on file    Minutes per session: Not on file  . Stress: Not on file  Relationships  . Social connections:    Talks on phone: Not on file    Gets together: Not on file    Attends religious service: Not on file    Active member of club or organization: Not on file    Attends meetings of clubs or organizations: Not on file    Relationship status: Not on file  . Intimate partner violence:    Fear of current or ex partner: Not on file    Emotionally abused: Not on file    Physically abused: Not on file    Forced sexual activity: Not on file  Other Topics Concern  . Not on file  Social History Narrative   Patient reports it being difficult to pay for everything due to outstanding medical bills from hospital stay last year but states she is able to keep up with basic expenses.  Patient does have some concerns with her house's condition due to a water leak- had her roof repaired last month but has some leaking in the front which has affected her porch.  Patient owns her own car and is able to drive herself but has some concerns about the reliability of her car- has gotten taxi to the clinic for appointment in the past when her car broke down.    Physical Exam  Constitutional: She is oriented to  person, place, and time.  Cardiovascular: Normal rate and regular rhythm.  Pulmonary/Chest: Effort normal and breath sounds normal.  Abdominal: Soft.  Musculoskeletal: Normal range of motion.  Neurological: She is alert and oriented to person, place, and time.  Skin: Skin is warm and dry.  Psychiatric: She has a normal mood and affect.        Future Appointments  Date Time Provider [place]  08/03/2018 10:00 AM MC ECHO 1-BUZZ [facility]ECH[facility]  08/03/2018 11:40 AM Bensimhon, Shaune Pascal, MD MC-HVSC None    BP (!) 150/90 (BP Location:  Left Arm, Patient Position: Sitting, Cuff Size: Normal)   Pulse 70   Resp 18   Wt 212 lb 6.4 oz (96.3 kg)   SpO2 97%   BMI 32.78 kg/m   Weight yesterday- 212.4 lb Last visit weight- 214 lb  Ms Sweatt was seen at home today and reported feeling well. She denied chest pain, SOB, headache, dizziness or orthopnea. She had been taking her medications over the weekend but I noted that she had broken the Entresto in half again despite being told it was not appropriate. The purpose of today's visit was to change her from [facility] to losartan 25 mg BID. While I was there I refilled her pillbox and obtained vital signs. She was noted to be slightly hypertensive but had not taken her evening medications. I will follow up next week.   [name], EMT 07/09/18  ACTION: Home visit completed Next visit planned for 1 week

## 2018-07-09 NOTE — Telephone Encounter (Signed)
I called Ms [name] [name] to see if she was available for me to come make a medications reconciliation. She stated she would be home and the pharmacy was on the way to deliver her new medication so I let her know I would come that way immediately.

## 2018-07-17 ENCOUNTER — Other Ambulatory Visit (HOSPITAL_COMMUNITY): Payer: Self-pay

## 2018-07-17 NOTE — Progress Notes (Signed)
I went to see [name] [name] to make a change in her pillbox at the request of the HF clinic. I added an extra 20 mg of furosemide BID into her pillbox for the next two days to help remove the fluid she gained over the holiday. I explained the change to her and she was understanding and agreeable.

## 2018-07-17 NOTE — Progress Notes (Signed)
Paramedicine Encounter    Patient ID: [name] [name], female    DOB: 04-Nov-1945, 72 y.o.   MRN: [id number]   Patient Care Team: [name], MD as PCP - General (Internal Medicine) Debara Pickett Nadean Corwin, MD as PCP - Cardiology (Cardiology) Debara Pickett Nadean Corwin, MD as Consulting Physician (Cardiology)  Patient Active Problem List   Diagnosis Date Noted  . Postmenopausal vaginal bleeding 01/04/2018  . Snoring 01/04/2018  . Acute on chronic combined systolic and diastolic CHF (congestive heart failure) ([place])   . CHF exacerbation ([place]) 10/16/2017  . Noncompliance 10/16/2017  . Acute on chronic congestive heart failure ([place])   . Influenza with respiratory manifestation flu a + 09/14/16 started on tamiflu 09/15/17 09/19/2017  . Thrombocytopenia ([place]) 09/14/2017  . Acute drug-induced gout of right foot   . Medication noncompliance due to cognitive impairment 09/06/2017  . Acute diastolic CHF (congestive heart failure) ([place]) 08/06/2017  . LGI bleed, likely hemorrhoids 08/06/2017  . Atrial fibrillation ([place]) 08/04/2017  . Paroxysmal atrial fibrillation ([place]) 02/08/2017  . Cardiomyopathy, ischemic 02/08/2017  . Dysphagia 02/08/2017  . CKD (chronic kidney disease), stage III ([place]) 01/30/2017  . Pain in shoulder 02/08/2016  . Breast pain, left 02/08/2016  . Bilateral arm numbness and tingling while sleeping 02/08/2016  . Painful lumpy left breast 09/23/2015  . Candidal intertrigo 02/11/2015  . S/P CABG x 2 11/14/2014  . Accelerated hypertension   . Cardiac arrest ([place]) 11/04/2014  . Left main coronary artery disease 11/04/2014  . Coronary artery disease due to lipid rich plaque   . Acute respiratory failure with hypoxemia ([place])   . Essential hypertension   . Atrial fibrillation with RVR ([place])   . Hypokalemia 10/30/2014  . Chronic diastolic CHF (congestive heart failure) ([place]) 10/30/2014  . NSTEMI (non-ST elevated myocardial infarction) ([place])   . DOE (dyspnea on exertion)   . CAP (community  acquired pneumonia) 10/29/2014    Current Outpatient Medications:  .  acetaminophen (TYLENOL) 500 MG tablet, Take 500 mg by mouth every 6 (six) hours as needed for moderate pain. , Disp: , Rfl:  .  allopurinol (ZYLOPRIM) 300 MG tablet, Take 1 tablet (300 mg total) by mouth daily., Disp: 30 tablet, Rfl: 11 .  atorvastatin (LIPITOR) 10 MG tablet, Take 10 mg by mouth daily., Disp: , Rfl:  .  budesonide (PULMICORT) 0.5 MG/2ML nebulizer solution, Take 2 mLs (0.5 mg total) by nebulization 2 (two) times daily., Disp: , Rfl: 12 .  furosemide (LASIX) 20 MG tablet, TAKE 3 TABLETS (60 MG TOTAL) BY MOUTH 2 (TWO) TIMES DAILY., Disp: 180 tablet, Rfl: 11 .  losartan (COZAAR) 25 MG tablet, Take 1 tablet (25 mg total) by mouth 2 (two) times daily., Disp: 90 tablet, Rfl: 3 .  magnesium oxide (MAG-OX) 400 MG tablet, Take 1 tablet (400 mg total) by mouth 3 (three) times daily., Disp: 270 tablet, Rfl: 3 .  ranitidine (ZANTAC) 150 MG capsule, Take 150 mg by mouth 2 (two) times daily as needed for heartburn. , Disp: , Rfl:  .  rivaroxaban (XARELTO) 20 MG TABS tablet, Take 1 tablet (20 mg total) by mouth daily with supper., Disp: 30 tablet, Rfl: 11 .  spironolactone (ALDACTONE) 25 MG tablet, Take 1 tablet (25 mg total) by mouth daily., Disp: 30 tablet, Rfl: 3 .  tizanidine (ZANAFLEX) 2 MG capsule, Take 1 capsule (2 mg total) by mouth at bedtime as needed (back pain)., Disp: 30 capsule, Rfl: 0 .  levalbuterol (XOPENEX) 0.63 MG/3ML nebulizer solution, Take 3  mLs (0.63 mg total) by nebulization every 6 (six) hours as needed for wheezing or shortness of breath. (Patient not taking: Reported on 06/07/2018), Disp: 3 mL, Rfl: 12 Allergies  Allergen Reactions  . Bee Venom Anaphylaxis  . Codeine Nausea And Vomiting  . Shrimp [Shellfish Allergy] Swelling  . Tomato     Pt states it gives her gout      Social History   Socioeconomic History  . Marital status: Widowed    Spouse name: Not on file  . Number of children: 1   . Years of education: 30  . Highest education level: Not on file  Occupational History  . Not on file  Social Needs  . Financial resource strain: Not hard at all  . Food insecurity:    Worry: Never true    Inability: Never true  . Transportation needs:    Medical: No    Non-medical: No  Tobacco Use  . Smoking status: Former Smoker    Packs/day: 0.10    Years: 56.00    Pack years: 5.60    Types: Cigarettes    Last attempt to quit: 2019    Years since quitting: 0.9  . Smokeless tobacco: Never Used  Substance and Sexual Activity  . Alcohol use: No    Alcohol/week: 0.0 standard drinks  . Drug use: No  . Sexual activity: Not on file  Lifestyle  . Physical activity:    Days per week: Not on file    Minutes per session: Not on file  . Stress: Not on file  Relationships  . Social connections:    Talks on phone: Not on file    Gets together: Not on file    Attends religious service: Not on file    Active member of club or organization: Not on file    Attends meetings of clubs or organizations: Not on file    Relationship status: Not on file  . Intimate partner violence:    Fear of current or ex partner: Not on file    Emotionally abused: Not on file    Physically abused: Not on file    Forced sexual activity: Not on file  Other Topics Concern  . Not on file  Social History Narrative   Patient reports it being difficult to pay for everything due to outstanding medical bills from hospital stay last year but states she is able to keep up with basic expenses.  Patient does have some concerns with her house's condition due to a water leak- had her roof repaired last month but has some leaking in the front which has affected her porch.  Patient owns her own car and is able to drive herself but has some concerns about the reliability of her car- has gotten taxi to the clinic for appointment in the past when her car broke down.    Physical Exam  Constitutional: She is oriented to  person, place, and time.  Cardiovascular: Normal rate and regular rhythm.  Pulmonary/Chest: Effort normal and breath sounds normal.  Abdominal: Soft. Bowel sounds are normal.  Musculoskeletal: Normal range of motion. She exhibits edema.  Neurological: She is alert and oriented to person, place, and time.  Skin: Skin is warm and dry.  Psychiatric: She has a normal mood and affect.        Future Appointments  Date Time Provider [place]  08/03/2018 10:00 AM MC ECHO 1-BUZZ MC-ECHOLAB [facility]  08/03/2018 11:40 AM Bensimhon, Shaune Pascal, MD MC-HVSC None  BP 136/72 (BP Location: Left Arm, Patient Position: Sitting, Cuff Size: Normal)   Pulse 76   Resp 16   Wt 217 lb (98.4 kg)   SpO2 96%   BMI 33.49 kg/m   Weight yesterday- 217 lb Last visit weight- 212 lb  [name] [name] was seen at home today and reported feeling generally well. She stated her intermittent chest pain had resolved since being taken off Entresto but said she began having intermittent headaches as soon as she started taking Losartan. She stated they are brief in nature and never last more than a few minutes and her PCP instructed her to take tylenol, which she has complied with and reported it helping. Nevertheless, she believes the headaches are being caused by the losartan. She reported being compliant with her medications but her weight has increased 5 lb since last week. I contacted the clinic for guidance and am awaiting feedback. Her medications were verified and her pillbox was refilled.   [name], EMT 07/17/18  ACTION: Home visit completed Next visit planned for 1 week

## 2018-07-24 ENCOUNTER — Telehealth (HOSPITAL_COMMUNITY): Payer: Self-pay

## 2018-07-24 ENCOUNTER — Other Ambulatory Visit (HOSPITAL_COMMUNITY): Payer: Self-pay

## 2018-07-24 NOTE — Progress Notes (Signed)
Paramedicine Encounter    Patient ID: [name] [name], female    DOB: 1945/12/25, 72 y.o.   MRN: [id number]   Patient Care Team: [name], MD as PCP - General (Internal Medicine) Debara Pickett Nadean Corwin, MD as PCP - Cardiology (Cardiology) Debara Pickett Nadean Corwin, MD as Consulting Physician (Cardiology)  Patient Active Problem List   Diagnosis Date Noted  . Postmenopausal vaginal bleeding 01/04/2018  . Snoring 01/04/2018  . Acute on chronic combined systolic and diastolic CHF (congestive heart failure) ([place])   . CHF exacerbation ([place]) 10/16/2017  . Noncompliance 10/16/2017  . Acute on chronic congestive heart failure ([place])   . Influenza with respiratory manifestation flu a + 09/14/16 started on tamiflu 09/15/17 09/19/2017  . Thrombocytopenia ([place]) 09/14/2017  . Acute drug-induced gout of right foot   . Medication noncompliance due to cognitive impairment 09/06/2017  . Acute diastolic CHF (congestive heart failure) ([place]) 08/06/2017  . LGI bleed, likely hemorrhoids 08/06/2017  . Atrial fibrillation ([place]) 08/04/2017  . Paroxysmal atrial fibrillation ([place]) 02/08/2017  . Cardiomyopathy, ischemic 02/08/2017  . Dysphagia 02/08/2017  . CKD (chronic kidney disease), stage III ([place]) 01/30/2017  . Pain in shoulder 02/08/2016  . Breast pain, left 02/08/2016  . Bilateral arm numbness and tingling while sleeping 02/08/2016  . Painful lumpy left breast 09/23/2015  . Candidal intertrigo 02/11/2015  . S/P CABG x 2 11/14/2014  . Accelerated hypertension   . Cardiac arrest ([place]) 11/04/2014  . Left main coronary artery disease 11/04/2014  . Coronary artery disease due to lipid rich plaque   . Acute respiratory failure with hypoxemia ([place])   . Essential hypertension   . Atrial fibrillation with RVR ([place])   . Hypokalemia 10/30/2014  . Chronic diastolic CHF (congestive heart failure) ([place]) 10/30/2014  . NSTEMI (non-ST elevated myocardial infarction) ([place])   . DOE (dyspnea on exertion)   . CAP (community  acquired pneumonia) 10/29/2014    Current Outpatient Medications:  .  acetaminophen (TYLENOL) 500 MG tablet, Take 500 mg by mouth every 6 (six) hours as needed for moderate pain. , Disp: , Rfl:  .  allopurinol (ZYLOPRIM) 300 MG tablet, Take 1 tablet (300 mg total) by mouth daily., Disp: 30 tablet, Rfl: 11 .  atorvastatin (LIPITOR) 10 MG tablet, Take 10 mg by mouth daily., Disp: , Rfl:  .  budesonide (PULMICORT) 0.5 MG/2ML nebulizer solution, Take 2 mLs (0.5 mg total) by nebulization 2 (two) times daily., Disp: , Rfl: 12 .  furosemide (LASIX) 20 MG tablet, TAKE 3 TABLETS (60 MG TOTAL) BY MOUTH 2 (TWO) TIMES DAILY., Disp: 180 tablet, Rfl: 11 .  losartan (COZAAR) 25 MG tablet, Take 1 tablet (25 mg total) by mouth 2 (two) times daily., Disp: 90 tablet, Rfl: 3 .  magnesium oxide (MAG-OX) 400 MG tablet, Take 1 tablet (400 mg total) by mouth 3 (three) times daily., Disp: 270 tablet, Rfl: 3 .  rivaroxaban (XARELTO) 20 MG TABS tablet, Take 1 tablet (20 mg total) by mouth daily with supper., Disp: 30 tablet, Rfl: 11 .  tizanidine (ZANAFLEX) 2 MG capsule, Take 1 capsule (2 mg total) by mouth at bedtime as needed (back pain)., Disp: 30 capsule, Rfl: 0 .  levalbuterol (XOPENEX) 0.63 MG/3ML nebulizer solution, Take 3 mLs (0.63 mg total) by nebulization every 6 (six) hours as needed for wheezing or shortness of breath. (Patient not taking: Reported on 06/07/2018), Disp: 3 mL, Rfl: 12 .  ranitidine (ZANTAC) 150 MG capsule, Take 150 mg by mouth 2 (two) times daily as  needed for heartburn. , Disp: , Rfl:  .  spironolactone (ALDACTONE) 25 MG tablet, Take 1 tablet (25 mg total) by mouth daily. (Patient not taking: Reported on 07/24/2018), Disp: 30 tablet, Rfl: 3 Allergies  Allergen Reactions  . Bee Venom Anaphylaxis  . Codeine Nausea And Vomiting  . Shrimp [Shellfish Allergy] Swelling  . Tomato     Pt states it gives her gout      Social History   Socioeconomic History  . Marital status: Widowed    Spouse  name: Not on file  . Number of children: 1  . Years of education: 40  . Highest education level: Not on file  Occupational History  . Not on file  Social Needs  . Financial resource strain: Not hard at all  . Food insecurity:    Worry: Never true    Inability: Never true  . Transportation needs:    Medical: No    Non-medical: No  Tobacco Use  . Smoking status: Former Smoker    Packs/day: 0.10    Years: 56.00    Pack years: 5.60    Types: Cigarettes    Last attempt to quit: 2019    Years since quitting: 0.9  . Smokeless tobacco: Never Used  Substance and Sexual Activity  . Alcohol use: No    Alcohol/week: 0.0 standard drinks  . Drug use: No  . Sexual activity: Not on file  Lifestyle  . Physical activity:    Days per week: Not on file    Minutes per session: Not on file  . Stress: Not on file  Relationships  . Social connections:    Talks on phone: Not on file    Gets together: Not on file    Attends religious service: Not on file    Active member of club or organization: Not on file    Attends meetings of clubs or organizations: Not on file    Relationship status: Not on file  . Intimate partner violence:    Fear of current or ex partner: Not on file    Emotionally abused: Not on file    Physically abused: Not on file    Forced sexual activity: Not on file  Other Topics Concern  . Not on file  Social History Narrative   Patient reports it being difficult to pay for everything due to outstanding medical bills from hospital stay last year but states she is able to keep up with basic expenses.  Patient does have some concerns with her house's condition due to a water leak- had her roof repaired last month but has some leaking in the front which has affected her porch.  Patient owns her own car and is able to drive herself but has some concerns about the reliability of her car- has gotten taxi to the clinic for appointment in the past when her car broke down.     Physical Exam  Constitutional: She is oriented to person, place, and time.  Cardiovascular: Normal rate and regular rhythm.  Pulmonary/Chest: Effort normal and breath sounds normal.  Abdominal: Soft.  Musculoskeletal: Normal range of motion. She exhibits edema.  Neurological: She is alert and oriented to person, place, and time.  Skin: Skin is warm and dry.  Psychiatric: She has a normal mood and affect.        Future Appointments  Date Time Provider [place]  08/03/2018 10:00 AM MC ECHO 1-BUZZ MC-ECHOLAB [facility]  08/03/2018 11:40 AM Bensimhon, Shaune Pascal, MD MC-[facility]  None    BP (!) 160/80 (BP Location: Left Arm, Patient Position: Sitting, Cuff Size: Large)   Pulse 80   Resp 18   Wt 219 lb (99.3 kg)   SpO2 98%   BMI 33.79 kg/m   Weight yesterday- 217 lb Last visit weight- 217 lb  Ms Giglia was seen at home today and reported feeling well. She denied chest pain, SOB, headache, dizziness or orthopnea. She reported being compliant with her medications over the past week and her weight has been stable. Her medications were verified and her pillbox was refilled. She was out of spironolactone so I contacted the pharmacy and it will be delivered tomorrow. She stated she would be able to put it in the box without assistance.   [name], EMT 07/24/18  ACTION: Home visit completed Next visit planned for 1 week

## 2018-07-24 NOTE — Telephone Encounter (Signed)
I received a phone call from [name] [name] stating that she was out running errands but would be home after 13:00. I advised that I was on the way to see a patient but could see her at 14:00. She was understanding and agreeable.

## 2018-07-24 NOTE — Telephone Encounter (Signed)
I called Ms [name] [name] to schedule an appointment. She did not answer so I left a voicemail requesting she call me back when she is able.

## 2018-08-01 ENCOUNTER — Other Ambulatory Visit (HOSPITAL_COMMUNITY): Payer: Self-pay

## 2018-08-01 ENCOUNTER — Telehealth (HOSPITAL_COMMUNITY): Payer: Self-pay

## 2018-08-01 NOTE — Telephone Encounter (Signed)
I called [name] [name] to schedule an appointment. She did not answer so I left a voicemail requesting she call me back.

## 2018-08-01 NOTE — Progress Notes (Signed)
Paramedicine Encounter    Patient ID: [name] [name], female    DOB: [birth year], 72 y.o.   MRN: [id number]   Patient Care Team: [name], MD as PCP - General (Internal Medicine) Debara Pickett Nadean Corwin, MD as PCP - Cardiology (Cardiology) Debara Pickett Nadean Corwin, MD as Consulting Physician (Cardiology) Jorge Ny, LCSW as Social Worker (Licensed Clinical Social Worker)  Patient Active Problem List   Diagnosis Date Noted  . Postmenopausal vaginal bleeding 01/04/2018  . Snoring 01/04/2018  . Acute on chronic combined systolic and diastolic CHF (congestive heart failure) ([place])   . CHF exacerbation ([place]) 10/16/2017  . Noncompliance 10/16/2017  . Acute on chronic congestive heart failure ([place])   . Influenza with respiratory manifestation flu a + 09/14/16 started on tamiflu 09/15/17 09/19/2017  . Thrombocytopenia ([place]) 09/14/2017  . Acute drug-induced gout of right foot   . Medication noncompliance due to cognitive impairment 09/06/2017  . Acute diastolic CHF (congestive heart failure) ([place]) 08/06/2017  . LGI bleed, likely hemorrhoids 08/06/2017  . Atrial fibrillation ([place]) 08/04/2017  . Paroxysmal atrial fibrillation ([place]) 02/08/2017  . Cardiomyopathy, ischemic 02/08/2017  . Dysphagia 02/08/2017  . CKD (chronic kidney disease), stage III ([place]) 01/30/2017  . Pain in shoulder 02/08/2016  . Breast pain, left 02/08/2016  . Bilateral arm numbness and tingling while sleeping 02/08/2016  . Painful lumpy left breast 09/23/2015  . [place] intertrigo 02/11/2015  . S/P CABG x 2 11/14/2014  . Accelerated hypertension   . Cardiac arrest ([place]) 11/04/2014  . Left main coronary artery disease 11/04/2014  . Coronary artery disease due to lipid rich plaque   . Acute respiratory failure with hypoxemia ([place])   . Essential hypertension   . Atrial fibrillation with RVR ([place])   . Hypokalemia 10/30/2014  . Chronic diastolic CHF (congestive heart failure) ([place]) 10/30/2014  . NSTEMI (non-ST elevated myocardial  infarction) ([place])   . DOE (dyspnea on exertion)   . CAP (community acquired pneumonia) 10/29/2014    Current Outpatient Medications:  .  allopurinol (ZYLOPRIM) 300 MG tablet, Take 1 tablet (300 mg total) by mouth daily., Disp: 30 tablet, Rfl: 11 .  atorvastatin (LIPITOR) 10 MG tablet, Take 10 mg by mouth daily., Disp: , Rfl:  .  furosemide (LASIX) 20 MG tablet, TAKE 3 TABLETS (60 MG TOTAL) BY MOUTH 2 (TWO) TIMES DAILY., Disp: 180 tablet, Rfl: 11 .  losartan (COZAAR) 25 MG tablet, Take 1 tablet (25 mg total) by mouth 2 (two) times daily., Disp: 90 tablet, Rfl: 3 .  magnesium oxide (MAG-OX) 400 MG tablet, Take 1 tablet (400 mg total) by mouth 3 (three) times daily., Disp: 270 tablet, Rfl: 3 .  rivaroxaban (XARELTO) 20 MG TABS tablet, Take 1 tablet (20 mg total) by mouth daily with supper., Disp: 30 tablet, Rfl: 11 .  spironolactone (ALDACTONE) 25 MG tablet, Take 1 tablet (25 mg total) by mouth daily., Disp: 30 tablet, Rfl: 3 .  acetaminophen (TYLENOL) 500 MG tablet, Take 500 mg by mouth every 6 (six) hours as needed for moderate pain. , Disp: , Rfl:  .  budesonide (PULMICORT) 0.5 MG/2ML nebulizer solution, Take 2 mLs (0.5 mg total) by nebulization 2 (two) times daily., Disp: , Rfl: 12 .  levalbuterol (XOPENEX) 0.63 MG/3ML nebulizer solution, Take 3 mLs (0.63 mg total) by nebulization every 6 (six) hours as needed for wheezing or shortness of breath. (Patient not taking: Reported on 06/07/2018), Disp: 3 mL, Rfl: 12 .  ranitidine (ZANTAC) 150 MG capsule, Take 150 mg by  mouth 2 (two) times daily as needed for heartburn. , Disp: , Rfl:  .  tizanidine (ZANAFLEX) 2 MG capsule, Take 1 capsule (2 mg total) by mouth at bedtime as needed (back pain)., Disp: 30 capsule, Rfl: 0 Allergies  Allergen Reactions  . Bee Venom Anaphylaxis  . Codeine Nausea And Vomiting  . Shrimp [Shellfish Allergy] Swelling  . Tomato     Pt states it gives her gout      Social History   Socioeconomic History  . Marital  status: Widowed    Spouse name: Not on file  . Number of children: 1  . Years of education: 34  . Highest education level: Not on file  Occupational History  . Not on file  Social Needs  . Financial resource strain: Not hard at all  . Food insecurity:    Worry: Never true    Inability: Never true  . Transportation needs:    Medical: No    Non-medical: No  Tobacco Use  . Smoking status: Former Smoker    Packs/day: 0.10    Years: 56.00    Pack years: 5.60    Types: Cigarettes    Last attempt to quit: 2019    Years since quitting: 0.9  . Smokeless tobacco: Never Used  Substance and Sexual Activity  . Alcohol use: No    Alcohol/week: 0.0 standard drinks  . Drug use: No  . Sexual activity: Not on file  Lifestyle  . Physical activity:    Days per week: Not on file    Minutes per session: Not on file  . Stress: Not on file  Relationships  . Social connections:    Talks on phone: Not on file    Gets together: Not on file    Attends religious service: Not on file    Active member of club or organization: Not on file    Attends meetings of clubs or organizations: Not on file    Relationship status: Not on file  . Intimate partner violence:    Fear of current or ex partner: Not on file    Emotionally abused: Not on file    Physically abused: Not on file    Forced sexual activity: Not on file  Other Topics Concern  . Not on file  Social History Narrative   Patient reports it being difficult to pay for everything due to outstanding medical bills from hospital stay last year but states she is able to keep up with basic expenses.  Patient does have some concerns with her house's condition due to a water leak- had her roof repaired last month but has some leaking in the front which has affected her porch.  Patient owns her own car and is able to drive herself but has some concerns about the reliability of her car- has gotten taxi to the clinic for appointment in the past when her  car broke down.    Physical Exam Neck:     Musculoskeletal: Normal range of motion.  Cardiovascular:     Rate and Rhythm: Regular rhythm. Bradycardia present.  Pulmonary:     Effort: Pulmonary effort is normal.     Breath sounds: Normal breath sounds.  Musculoskeletal: Normal range of motion.     Right lower leg: Edema present.     Left lower leg: Edema present.  Skin:    General: Skin is warm and dry.  Neurological:     Mental Status: She is alert.  Psychiatric:  Mood and Affect: Mood normal.         Future Appointments  Date Time Provider [place]  08/03/2018 10:00 AM [facility] ECHO 1-BU[facility]  08/03/2018 11:40 AM Bensimhon, Shaune Pascal, MD [facility] None    BP (!) 160/80 (BP Location: Left Arm, Patient Position: Sitting, Cuff Size: Large)   Pulse (!) 56   Resp 16   SpO2 98%   Weight yesterday- Did not weigh Last visit weight- 219 lb  Ms Radwan was seen at home today and reported feeling generally well. She stated having intermittent headaches since starting losartan and as a result she has intermittently been cutting them in half. I advised he against this and she stated she would not do it anymore. I advised her to take her medications as prescribed leading up to he echo and subsequent appointment with Dr Haroldine Laws and she was agreeable. I also instructed her to bring up the headaches with him and she agreed. I will follow up on Friday at her appointment.  [name], EMT 08/01/18  ACTION: Home visit completed Next visit planned for Friday

## 2018-08-03 ENCOUNTER — Encounter (HOSPITAL_COMMUNITY): Payer: Self-pay | Admitting: Internal Medicine

## 2018-08-03 ENCOUNTER — Ambulatory Visit (HOSPITAL_BASED_OUTPATIENT_CLINIC_OR_DEPARTMENT_OTHER)
Admission: RE | Admit: 2018-08-03 | Discharge: 2018-08-03 | Disposition: A | Discharge status: Home / Self Care | Payer: PPO | Source: Ambulatory Visit | Attending: Internal Medicine | Admitting: Internal Medicine

## 2018-08-03 ENCOUNTER — Other Ambulatory Visit (HOSPITAL_COMMUNITY): Payer: Self-pay

## 2018-08-03 ENCOUNTER — Ambulatory Visit (HOSPITAL_COMMUNITY)
Admission: RE | Admit: 2018-08-03 | Discharge: 2018-08-03 | Disposition: A | Discharge status: Home / Self Care | Payer: PPO | Source: Ambulatory Visit | Attending: Cardiology | Admitting: Cardiology

## 2018-08-03 VITALS — BP 112/80 | HR 60 | Wt 221.2 lb

## 2018-08-03 DIAGNOSIS — I340 Nonrheumatic mitral (valve) insufficiency: Secondary | ICD-10-CM | POA: Insufficient documentation

## 2018-08-03 DIAGNOSIS — I119 Hypertensive heart disease without heart failure: Secondary | ICD-10-CM | POA: Diagnosis not present

## 2018-08-03 DIAGNOSIS — I2510 Atherosclerotic heart disease of native coronary artery without angina pectoris: Secondary | ICD-10-CM | POA: Diagnosis not present

## 2018-08-03 DIAGNOSIS — I5032 Chronic diastolic (congestive) heart failure: Secondary | ICD-10-CM | POA: Diagnosis not present

## 2018-08-03 DIAGNOSIS — R9431 Abnormal electrocardiogram [ECG] [EKG]: Secondary | ICD-10-CM | POA: Insufficient documentation

## 2018-08-03 DIAGNOSIS — I480 Paroxysmal atrial fibrillation: Secondary | ICD-10-CM

## 2018-08-03 LAB — BASIC METABOLIC PANEL
Anion gap: 13 (ref 5–15)
BUN: 28 mg/dL — ABNORMAL HIGH (ref 8–23)
CO2: 21 mmol/L — ABNORMAL LOW (ref 22–32)
Calcium: 9.8 mg/dL (ref 8.9–10.3)
Chloride: 106 mmol/L (ref 98–111)
Creatinine, Ser: 1.5 mg/dL — ABNORMAL HIGH (ref 0.44–1.00)
GFR calc Af Amer: 40 mL/min — ABNORMAL LOW (ref >60)
GFR calc non Af Amer: 34 mL/min — ABNORMAL LOW (ref >60)
Glucose, Bld: 106 mg/dL — ABNORMAL HIGH (ref 70–99)
Potassium: 4 mmol/L (ref 3.5–5.1)
Sodium: 140 mmol/L (ref 135–145)

## 2018-08-03 LAB — BRAIN NATRIURETIC PEPTIDE: B Natriuretic Peptide: 30.5 pg/mL (ref 0.0–100.0)

## 2018-08-03 MED ORDER — CARVEDILOL 3.125 MG PO TABS: 3.1250 mg | ORAL_TABLET | Freq: Two times a day (BID) | ORAL | 3 refills | Status: DC

## 2018-08-03 NOTE — Patient Instructions (Addendum)
Labs done today  EKG done today  START Carvedilol 3.125mg  (1 tab) twice daily  Your physician has requested that you have a stress echocardiogram. For further information please visit [email or web address]. Please follow instruction sheet as given. This will be done at the [facility]. They will contact you in order to schedule your appointment.

## 2018-08-03 NOTE — Progress Notes (Signed)
Advanced Heart Failure Clinic  Note   PCP: [name], MD PCP-Cardiologist: [name], MD  HF: Dr Haroldine Laws   HPI: [name] [name] is a 72 y.o. female with h/o HTN, Tobacco abuse, PAF, CAD s/p CABG x 2 (LIMA to LAD and SVG to OM) in 2016, Chronic combined CHF, Ischemic CMP, HLD, and DOE.   She was initially noted to be in atrial fibrillation in 2016 with a reduced LVEF, leading to ischemic workup with a cardiac cath showing severe multivessel CAD with LM involvement in which she underwent CABG x2 utilizing LIMA to LAD, SVG to OM.   Admitted 1/19 for AF with RVR in setting of Influenza A. During that hospitalization, she was started on diltiazem drip, EP was consulted to discuss plans for Tikosyn vs. ablation vs. medication rate control however, due to her known medication noncompliance she was found to not be an ablation nor antiarrhythmic candidate  Pt admitted 3/3 - 10/20/17 with recurrent Afib RVR and ADHF. Started on diltiazem. Echo repeated which showed fall in EF from previous. Diuresed with IV lasix with 5L negative for admission. Discharged home on diltiazem 360, Torpol 100, digoxin and Xarelto.  Seen by Dr. Debara Pickett 11/20/17, was feeling well overall. Of note, she reported not remembering her recent hospitalization. She was also noted to be out of her metoprolol for approximately a month.   She was found to be in Afib. She was started on amiodarone and diltiazem was DCd. She was also started on Entresto. She underwent successful atrial flutter TEE/DCCV on 12/25/17. She was back in rate controlled afib on 5/16 during paramedic visit.  In May Toprol XL was cut back to 50 mg per day due to bradycardia. Toprol DC'd with ongoing bradycardia.   Today she returns for HF follow up. Here with [name] from Paramedicine. She stopped Entresto due to CP. Switched to losartan but occasioanlly cuts dose in half due to HA. Denies CP, SOB, orthopnea or PND. Weight stable.   Echo today EF 30-35%  Personally reviewed   Echo in 3/16 EF 35-40%. Echo in 10/17 EF improved to 50-55%.  F/u echo 5/18 EF stable 50-55% Echo 10/19/17 LVEF 30-35%, Moderate LVH, Mild AI, Moderate regurgitation, Severe LAE, Moderately reduced RV, Severe RAE, PA peak pressure 50 mm Hg.   (Decreased from Echo 01/11/2017 with LVEF 50-55%)  Review of systems complete and found to be negative unless listed in HPI.   Past Medical History:  Diagnosis Date  . Acute respiratory failure ([place]) 08/2017  . Arthritis   . Asthma    ??  . CAD (coronary artery disease)    a. s/p CABG 2016.  . Cardiomyopathy, ischemic    a. EF previously low, improved to LVEF 50-55% as of May 2018  . Chronic combined systolic and diastolic heart failure ([place])   . CKD (chronic kidney disease), stage III ([place])   . Gout   . Hypertension   . Hypokalemia   . LGI bleed 08/06/2017   a. felt to be hemorrhoidal during that admission (no drop in Hgb).  . Persistent atrial fibrillation    a. h/o difficult to control rates (complicated by noncompliance), not felt to be a candidate for ablation or antiarrhythmic due to noncompliance.  . Personal history of noncompliance with medical treatment, presenting hazards to health   . S/P CABG x 2 with clipping of LA appendage 11/14/2014   LIMA to LAD, SVG to OM, EVH via right thigh   Current Outpatient Medications  Medication Sig Dispense Refill  . acetaminophen (TYLENOL) 500 MG tablet Take 500 mg by mouth every 6 (six) hours as needed for moderate pain.     Marland Kitchen allopurinol (ZYLOPRIM) 300 MG tablet Take 1 tablet (300 mg total) by mouth daily. 30 tablet 11  . atorvastatin (LIPITOR) 10 MG tablet Take 10 mg by mouth daily.    . budesonide (PULMICORT) 0.5 MG/2ML nebulizer solution Take 2 mLs (0.5 mg total) by nebulization 2 (two) times daily.  12  . furosemide (LASIX) 20 MG tablet TAKE 3 TABLETS (60 MG TOTAL) BY MOUTH 2 (TWO) TIMES DAILY. 180 tablet 11  . levalbuterol (XOPENEX) 0.63 MG/3ML nebulizer solution Take 3  mLs (0.63 mg total) by nebulization every 6 (six) hours as needed for wheezing or shortness of breath. 3 mL 12  . losartan (COZAAR) 25 MG tablet Take 1 tablet (25 mg total) by mouth 2 (two) times daily. 90 tablet 3  . magnesium oxide (MAG-OX) 400 MG tablet Take 1 tablet (400 mg total) by mouth 3 (three) times daily. 270 tablet 3  . ranitidine (ZANTAC) 150 MG capsule Take 150 mg by mouth 2 (two) times daily as needed for heartburn.     . rivaroxaban (XARELTO) 20 MG TABS tablet Take 1 tablet (20 mg total) by mouth daily with supper. 30 tablet 11  . spironolactone (ALDACTONE) 25 MG tablet Take 1 tablet (25 mg total) by mouth daily. 30 tablet 3  . tizanidine (ZANAFLEX) 2 MG capsule Take 1 capsule (2 mg total) by mouth at bedtime as needed (back pain). 30 capsule 0   No current facility-administered medications for this encounter.    Allergies  Allergen Reactions  . Bee Venom Anaphylaxis  . Codeine Nausea And Vomiting  . Shrimp [Shellfish Allergy] Swelling  . Tomato     Pt states it gives her gout   Social History   Socioeconomic History  . Marital status: Widowed    Spouse name: Not on file  . Number of children: 1  . Years of education: 45  . Highest education level: Not on file  Occupational History  . Not on file  Social Needs  . Financial resource strain: Not hard at all  . Food insecurity:    Worry: Never true    Inability: Never true  . Transportation needs:    Medical: No    Non-medical: No  Tobacco Use  . Smoking status: Former Smoker    Packs/day: 0.10    Years: 56.00    Pack years: 5.60    Types: Cigarettes    Last attempt to quit: 2019    Years since quitting: 0.9  . Smokeless tobacco: Never Used  Substance and Sexual Activity  . Alcohol use: No    Alcohol/week: 0.0 standard drinks  . Drug use: No  . Sexual activity: Not on file  Lifestyle  . Physical activity:    Days per week: Not on file    Minutes per session: Not on file  . Stress: Not on file    Relationships  . Social connections:    Talks on phone: Not on file    Gets together: Not on file    Attends religious service: Not on file    Active member of club or organization: Not on file    Attends meetings of clubs or organizations: Not on file    Relationship status: Not on file  . Intimate partner violence:    Fear of current or ex partner: Not on  file    Emotionally abused: Not on file    Physically abused: Not on file    Forced sexual activity: Not on file  Other Topics Concern  . Not on file  Social History Narrative   Patient reports it being difficult to pay for everything due to outstanding medical bills from hospital stay last year but states she is able to keep up with basic expenses.  Patient does have some concerns with her house's condition due to a water leak- had her roof repaired last month but has some leaking in the front which has affected her porch.  Patient owns her own car and is able to drive herself but has some concerns about the reliability of her car- has gotten taxi to the clinic for appointment in the past when her car broke down.   Family History  Problem Relation Age of Onset  . Cancer Mother   . Heart disease Father    Vitals:   08/03/18 1114  BP: 112/80  Pulse: 60  SpO2: 97%  Weight: 100.3 kg (221 lb 3.2 oz)   Wt Readings from Last 3 Encounters:  08/03/18 100.3 kg (221 lb 3.2 oz)  07/24/18 99.3 kg (219 lb)  07/17/18 98.4 kg (217 lb)     PHYSICAL EXAM: General:  Obese woman sitting on exam table No resp difficulty HEENT: normal Neck: supple. no JVD. Carotids 2+ bilat; no bruits. No lymphadenopathy or thryomegaly appreciated. Cor: PMI nondisplaced. Regular rate & rhythm. No rubs, gallops or murmurs. Lungs: clear Abdomen: obese soft, nontender, nondistended. No hepatosplenomegaly. No bruits or masses. Good bowel sounds. Extremities: no cyanosis, clubbing, rash, edema Neuro: alert & orientedx3, cranial nerves grossly intact. moves all  4 extremities w/o difficulty. Affect pleasant   ASSESSMENT & PLAN:  1. Chronic combined systolic/diastolic CHF - Suspect etiology is combined Ischemic/NICM with Afib/RVR for nearly a year.   - Echo 10/19/17 LVEF 30-35%, Moderate LVH, Mild AI, Moderate regurgitation, Severe LAE, Moderately reduced RV, Severe RAE, PA peak pressure 50 mm Hg. (Decreased from Echo 01/11/2017 with LVEF 50-55%) - TEE 12/2017: EF 20-25%, RV mod to severely reduced - Echo today reviewed personally. EF 30-35%  - NYHA II. Volume status stable. Continue lasix 60 mg twice a day.  - Off BB with bradycardia - Unable to tolerate Entresto due to CP. Now tolerating losartan 25 bid. She does not want to titrate as she feels BP may be too low.  - Continue spiro 12.5 mg daily.  - Start carvedilol 3.125 bid. Watch for bradycardia (had on Toprol)  - Doesn't think she can do cMRI. Will proceed with Myoview to check EF and rule out ischemia as cause of dropping EF. May need cath - We discussed possible ICD. Will see what Myoview shows.  2. CAD s/p CABG x 2 2016 (LIMA to LAD and SVG to OM) - No clear angina. Myoview as above. - Continue statin. No ASA. On xarelto. 3. PAF s/p TEE/DCCV on 12/25/17 - Remains in NSR  - CHA2DS2-VASc is at least 5.  - Continue xarelto.  - Off amio with bradycardia  4. HTN -Ok  5. Tobacco Abuse - Stopped smoking 10/2017. 6. HLD - Per [facility]. No change.  7. Suspected OSA/Snoring - She had a sleep study recently and was told she needs to come back for another one. -  Unable to locate study in epic. She cannot afford to pay for another sleep study. No change.  8. Social  - She has poor insight  into her disease.  - Continue HF Paramedicine.   Total time spent 35 minutes. Over half that time spent discussing above.    [name], MD 08/03/18

## 2018-08-03 NOTE — Progress Notes (Signed)
  Echocardiogram 2D Echocardiogram has been performed.  Jennette Dubin 08/03/2018, 10:46 AM

## 2018-08-03 NOTE — Progress Notes (Signed)
Paramedicine Encounter   Patient ID: [name] [name] , female,   DOB: 11-11-1945,72 y.o.,  MRN: [id number]   [name] was see at the CHF clinic today with Dr Haroldine Laws following her echocardiogram. Per Dr Haroldine Laws, she is to start taking 3.125 mg of carvedilol BID. No other changes at this time.[name], EMT 08/03/2018   ACTION: Next visit planned for 1 week

## 2018-08-06 ENCOUNTER — Other Ambulatory Visit (HOSPITAL_COMMUNITY): Payer: Self-pay | Admitting: *Deleted

## 2018-08-07 ENCOUNTER — Other Ambulatory Visit (HOSPITAL_COMMUNITY): Payer: Self-pay

## 2018-08-07 NOTE — Progress Notes (Signed)
Paramedicine Encounter    Patient ID: [name] [name], female    DOB: Aug 25, 1945, 72 y.o.   MRN: [id number]   Patient Care Team: [name], MD as PCP - General (Internal Medicine) Debara Pickett Nadean Corwin, MD as PCP - Cardiology (Cardiology) Debara Pickett Nadean Corwin, MD as Consulting Physician (Cardiology) Jorge Ny, LCSW as Social Worker (Licensed Clinical Social Worker)  Patient Active Problem List   Diagnosis Date Noted  . Postmenopausal vaginal bleeding 01/04/2018  . Snoring 01/04/2018  . Acute on chronic combined systolic and diastolic CHF (congestive heart failure) ([place])   . CHF exacerbation ([place]) 10/16/2017  . Noncompliance 10/16/2017  . Acute on chronic congestive heart failure ([place])   . Influenza with respiratory manifestation flu a + 09/14/16 started on tamiflu 09/15/17 09/19/2017  . Thrombocytopenia ([place]) 09/14/2017  . Acute drug-induced gout of right foot   . Medication noncompliance due to cognitive impairment 09/06/2017  . Acute diastolic CHF (congestive heart failure) ([place]) 08/06/2017  . LGI bleed, likely hemorrhoids 08/06/2017  . Atrial fibrillation ([place]) 08/04/2017  . Paroxysmal atrial fibrillation ([place]) 02/08/2017  . Cardiomyopathy, ischemic 02/08/2017  . Dysphagia 02/08/2017  . CKD (chronic kidney disease), stage III ([place]) 01/30/2017  . Pain in shoulder 02/08/2016  . Breast pain, left 02/08/2016  . Bilateral arm numbness and tingling while sleeping 02/08/2016  . Painful lumpy left breast 09/23/2015  . Candidal intertrigo 02/11/2015  . S/P CABG x 2 11/14/2014  . Accelerated hypertension   . Cardiac arrest ([place]) 11/04/2014  . Left main coronary artery disease 11/04/2014  . Coronary artery disease due to lipid rich plaque   . Acute respiratory failure with hypoxemia ([place])   . Essential hypertension   . Atrial fibrillation with RVR ([place])   . Hypokalemia 10/30/2014  . Chronic diastolic CHF (congestive heart failure) ([place]) 10/30/2014  . NSTEMI (non-ST elevated myocardial  infarction) ([place])   . DOE (dyspnea on exertion)   . CAP (community acquired pneumonia) 10/29/2014    Current Outpatient Medications:  .  acetaminophen (TYLENOL) 500 MG tablet, Take 500 mg by mouth every 6 (six) hours as needed for moderate pain. , Disp: , Rfl:  .  allopurinol (ZYLOPRIM) 300 MG tablet, Take 1 tablet (300 mg total) by mouth daily., Disp: 30 tablet, Rfl: 11 .  atorvastatin (LIPITOR) 10 MG tablet, Take 10 mg by mouth daily., Disp: , Rfl:  .  budesonide (PULMICORT) 0.5 MG/2ML nebulizer solution, Take 2 mLs (0.5 mg total) by nebulization 2 (two) times daily., Disp: , Rfl: 12 .  carvedilol (COREG) 3.125 MG tablet, Take 1 tablet (3.125 mg total) by mouth 2 (two) times daily with a meal., Disp: 60 tablet, Rfl: 3 .  furosemide (LASIX) 20 MG tablet, TAKE 3 TABLETS (60 MG TOTAL) BY MOUTH 2 (TWO) TIMES DAILY., Disp: 180 tablet, Rfl: 11 .  levalbuterol (XOPENEX) 0.63 MG/3ML nebulizer solution, Take 3 mLs (0.63 mg total) by nebulization every 6 (six) hours as needed for wheezing or shortness of breath., Disp: 3 mL, Rfl: 12 .  losartan (COZAAR) 25 MG tablet, Take 1 tablet (25 mg total) by mouth 2 (two) times daily., Disp: 90 tablet, Rfl: 3 .  magnesium oxide (MAG-OX) 400 MG tablet, Take 1 tablet (400 mg total) by mouth 3 (three) times daily., Disp: 270 tablet, Rfl: 3 .  ranitidine (ZANTAC) 150 MG capsule, Take 150 mg by mouth 2 (two) times daily as needed for heartburn. , Disp: , Rfl:  .  rivaroxaban (XARELTO) 20 MG TABS tablet, Take 1  tablet (20 mg total) by mouth daily with supper., Disp: 30 tablet, Rfl: 11 .  spironolactone (ALDACTONE) 25 MG tablet, Take 1 tablet (25 mg total) by mouth daily., Disp: 30 tablet, Rfl: 3 .  tizanidine (ZANAFLEX) 2 MG capsule, Take 1 capsule (2 mg total) by mouth at bedtime as needed (back pain)., Disp: 30 capsule, Rfl: 0 Allergies  Allergen Reactions  . Bee Venom Anaphylaxis  . Codeine Nausea And Vomiting  . Shrimp [Shellfish Allergy] Swelling  . Tomato      Pt states it gives her gout      Social History   Socioeconomic History  . Marital status: Widowed    Spouse name: Not on file  . Number of children: 1  . Years of education: 61  . Highest education level: Not on file  Occupational History  . Not on file  Social Needs  . Financial resource strain: Not hard at all  . Food insecurity:    Worry: Never true    Inability: Never true  . Transportation needs:    Medical: No    Non-medical: No  Tobacco Use  . Smoking status: Former Smoker    Packs/day: 0.10    Years: 56.00    Pack years: 5.60    Types: Cigarettes    Last attempt to quit: 2019    Years since quitting: 0.9  . Smokeless tobacco: Never Used  Substance and Sexual Activity  . Alcohol use: No    Alcohol/week: 0.0 standard drinks  . Drug use: No  . Sexual activity: Not on file  Lifestyle  . Physical activity:    Days per week: Not on file    Minutes per session: Not on file  . Stress: Not on file  Relationships  . Social connections:    Talks on phone: Not on file    Gets together: Not on file    Attends religious service: Not on file    Active member of club or organization: Not on file    Attends meetings of clubs or organizations: Not on file    Relationship status: Not on file  . Intimate partner violence:    Fear of current or ex partner: Not on file    Emotionally abused: Not on file    Physically abused: Not on file    Forced sexual activity: Not on file  Other Topics Concern  . Not on file  Social History Narrative   Patient reports it being difficult to pay for everything due to outstanding medical bills from hospital stay last year but states she is able to keep up with basic expenses.  Patient does have some concerns with her house's condition due to a water leak- had her roof repaired last month but has some leaking in the front which has affected her porch.  Patient owns her own car and is able to drive herself but has some concerns about the  reliability of her car- has gotten taxi to the clinic for appointment in the past when her car broke down.    Physical Exam Cardiovascular:     Rate and Rhythm: Normal rate and regular rhythm.  Pulmonary:     Effort: Pulmonary effort is normal.     Breath sounds: Normal breath sounds.  Musculoskeletal: Normal range of motion.     Right lower leg: Edema present.     Left lower leg: Edema present.  Skin:    General: Skin is warm and dry.  Capillary Refill: Capillary refill takes less than 2 seconds.  Neurological:     Mental Status: She is alert and oriented to person, place, and time.  Psychiatric:        Mood and Affect: Mood normal.         Future Appointments  Date Time Provider [place]  11/01/2018 12:00 PM [facility] PA/NP [facility] None    BP 130/74 (BP Location: Left Arm, Patient Position: Sitting, Cuff Size: Large)   Pulse 70   Resp 18   Wt 220 lb (99.8 kg)   SpO2 97%   BMI 33.95 kg/m   Weight yesterday- Can't remember Last visit weight- 221 lb  Ms Vandekamp was seen at home today and reported feeling generally well. She denied chest pain, SOB, headache, dizziness or orthopnea. She reported being compliant with her medications since her last visit and though sh could not recall her weight from yesterday, she believes it has been stable. Her medications were verified and her pillbox was refilled.   [name], EMT 08/07/18  ACTION: Home visit completed Next visit planned for 1 week

## 2018-08-13 ENCOUNTER — Telehealth (HOSPITAL_COMMUNITY): Payer: Self-pay

## 2018-08-13 DIAGNOSIS — I5032 Chronic diastolic (congestive) heart failure: Secondary | ICD-10-CM

## 2018-08-13 NOTE — Telephone Encounter (Signed)
Orders placed for pt  Stress echo

## 2018-08-14 ENCOUNTER — Telehealth (HOSPITAL_COMMUNITY): Payer: Self-pay

## 2018-08-14 ENCOUNTER — Telehealth (HOSPITAL_COMMUNITY): Payer: Self-pay | Admitting: *Deleted

## 2018-08-14 DIAGNOSIS — I5032 Chronic diastolic (congestive) heart failure: Secondary | ICD-10-CM

## 2018-08-14 NOTE — Telephone Encounter (Signed)
Order changed.

## 2018-08-14 NOTE — Telephone Encounter (Signed)
Left message on voicemail per DPR in reference to upcoming appointment scheduled on 08/20/18 at 15 with detailed instructions given per Myocardial Perfusion Study Information Sheet for the test. LM to arrive 15 minutes early, and that it is imperative to arrive on time for appointment to keep from having the test rescheduled. If you need to cancel or reschedule your appointment, please call the office within 24 hours of your appointment. Failure to do so may result in a cancellation of your appointment, and a $50 no show fee. Phone number given for call back for any questions. Johnta Couts, [place]

## 2018-08-16 ENCOUNTER — Other Ambulatory Visit (HOSPITAL_COMMUNITY): Payer: Self-pay

## 2018-08-16 ENCOUNTER — Telehealth (HOSPITAL_COMMUNITY): Payer: Self-pay

## 2018-08-16 NOTE — Telephone Encounter (Signed)
[name] [name] called me this morning and said, "[name] [name], you need to see me today." I asked what time would be good for her and she suggested 13:30 because she needed time to get up and showered. She said this is taking longer than usual because her legs have been hurting since starting carvedilol and she has to take tylenol just to be able to get out of bed. I advised I could be there at 13:30 and she confirmed she would be there.

## 2018-08-16 NOTE — Progress Notes (Signed)
I arrived at Ms Sonnen' house for our scheduled appointment. She was not home but her roommate let me in and brought me her medications. I verified her medications and refilled her pillbox with what was present. I contacted the pharmacy as she was running out of several medications. The advised that they were preparing her delivery at present and said it would be delivered to her tomorrow. I asked her roommate to have her call me when the medications arrive tomorrow and he agreed.

## 2018-08-17 ENCOUNTER — Other Ambulatory Visit (HOSPITAL_COMMUNITY): Payer: Self-pay

## 2018-08-17 ENCOUNTER — Telehealth (HOSPITAL_COMMUNITY): Payer: Self-pay | Admitting: Licensed Clinical Social Worker

## 2018-08-17 NOTE — Telephone Encounter (Signed)
CSW informed by community paramedic that pt having trouble affording [employer]- it is new year and previous assistance program is no longer active.  CSW called Wynetta Emery and Wynetta Emery who provided assistance and they confirmed that pt assistance expired at the end of the 2019 calender year and that pt would need to reapply- pt will not qualify until she has spend 4% of her income on prescriptions- this did not happen until September last year.   Pt thinks she can manage to afford her medications at this time but will reach out if she has issues- will plan to follow up with patient later in the year to see if eligible for Xarelto assistance at that time  CSW will continue to follow and assist as needed  Jorge Ny, [place] Worker [facility] [phone]

## 2018-08-17 NOTE — Progress Notes (Signed)
[name] [name] was seen at home with the purpose of finishing her pillbox after her medications were delivered today. She is complaining of bilateral hamstring pain since she "started that new pill" referring to carvedilol. I advised that carvedilol would not cause pain in her hamstrings but could make her feel more tired than normal until she gets used to it. She expressed discontent with this answer, saying that I told her that [facility] wasn't causing her to have chest pain but when she was taken off of it, the pain stopped. I advised again that her medications are safe and not the cause for the leg pain and she advised she would speak to her PCP regarding her leg pain. I encouraged her to do so. I will follow up next week.

## 2018-08-20 ENCOUNTER — Other Ambulatory Visit (HOSPITAL_COMMUNITY): Payer: PPO

## 2018-08-20 ENCOUNTER — Encounter (HOSPITAL_COMMUNITY): Payer: PPO

## 2018-08-21 ENCOUNTER — Other Ambulatory Visit: Payer: Self-pay | Admitting: Internal Medicine

## 2018-08-21 ENCOUNTER — Ambulatory Visit
Admission: RE | Admit: 2018-08-21 | Discharge: 2018-08-21 | Disposition: A | Discharge status: Home / Self Care | Payer: PPO | Source: Ambulatory Visit | Attending: Internal Medicine | Admitting: Internal Medicine

## 2018-08-21 DIAGNOSIS — M79641 Pain in right hand: Secondary | ICD-10-CM | POA: Diagnosis not present

## 2018-08-21 DIAGNOSIS — M62838 Other muscle spasm: Secondary | ICD-10-CM | POA: Diagnosis not present

## 2018-08-21 DIAGNOSIS — M79644 Pain in right finger(s): Secondary | ICD-10-CM | POA: Diagnosis not present

## 2018-08-21 DIAGNOSIS — Z23 Encounter for immunization: Secondary | ICD-10-CM | POA: Diagnosis not present

## 2018-08-21 DIAGNOSIS — R7303 Prediabetes: Secondary | ICD-10-CM | POA: Diagnosis not present

## 2018-08-21 DIAGNOSIS — M7989 Other specified soft tissue disorders: Secondary | ICD-10-CM | POA: Diagnosis not present

## 2018-08-23 ENCOUNTER — Telehealth (HOSPITAL_COMMUNITY): Payer: Self-pay

## 2018-08-23 NOTE — Telephone Encounter (Signed)
I called Ms Campion to schedule an appointment. He number was busy when I called and there was not option to leave a message. I will reach out again tomorrow.

## 2018-08-24 ENCOUNTER — Other Ambulatory Visit (HOSPITAL_COMMUNITY): Payer: Self-pay

## 2018-08-24 NOTE — Progress Notes (Signed)
Paramedicine Encounter    Patient ID: TURQUOISE ESCH, female    DOB: 12/24/1945, 73 y.o.   MRN: [id number]   Patient Care Team: [name], MD as PCP - General (Internal Medicine) Debara Pickett Nadean Corwin, MD as PCP - Cardiology (Cardiology) Debara Pickett Nadean Corwin, MD as Consulting Physician (Cardiology) Jorge Ny, LCSW as Social Worker (Licensed Clinical Social Worker)  Patient Active Problem List   Diagnosis Date Noted  . Postmenopausal vaginal bleeding 01/04/2018  . Snoring 01/04/2018  . Acute on chronic combined systolic and diastolic CHF (congestive heart failure) ([place])   . CHF exacerbation ([place]) 10/16/2017  . Noncompliance 10/16/2017  . Acute on chronic congestive heart failure ([place])   . Influenza with respiratory manifestation flu a + 09/14/16 started on tamiflu 09/15/17 09/19/2017  . Thrombocytopenia ([place]) 09/14/2017  . Acute drug-induced gout of right foot   . Medication noncompliance due to cognitive impairment 09/06/2017  . Acute diastolic CHF (congestive heart failure) ([place]) 08/06/2017  . LGI bleed, likely hemorrhoids 08/06/2017  . Atrial fibrillation ([place]) 08/04/2017  . Paroxysmal atrial fibrillation ([place]) 02/08/2017  . Cardiomyopathy, ischemic 02/08/2017  . Dysphagia 02/08/2017  . CKD (chronic kidney disease), stage III ([place]) 01/30/2017  . Pain in shoulder 02/08/2016  . Breast pain, left 02/08/2016  . Bilateral arm numbness and tingling while sleeping 02/08/2016  . Painful lumpy left breast 09/23/2015  . Candidal intertrigo 02/11/2015  . S/P CABG x 2 11/14/2014  . Accelerated hypertension   . Cardiac arrest ([place]) 11/04/2014  . Left main coronary artery disease 11/04/2014  . Coronary artery disease due to lipid rich plaque   . Acute respiratory failure with hypoxemia ([place])   . Essential hypertension   . Atrial fibrillation with RVR ([place])   . Hypokalemia 10/30/2014  . Chronic diastolic CHF (congestive heart failure) ([place]) 10/30/2014  . NSTEMI (non-ST elevated myocardial  infarction) ([place])   . DOE (dyspnea on exertion)   . CAP (community acquired pneumonia) 10/29/2014    Current Outpatient Medications:  .  allopurinol (ZYLOPRIM) 300 MG tablet, Take 1 tablet (300 mg total) by mouth daily., Disp: 30 tablet, Rfl: 11 .  atorvastatin (LIPITOR) 10 MG tablet, Take 10 mg by mouth daily., Disp: , Rfl:  .  carvedilol (COREG) 3.125 MG tablet, Take 1 tablet (3.125 mg total) by mouth 2 (two) times daily with a meal., Disp: 60 tablet, Rfl: 3 .  furosemide (LASIX) 20 MG tablet, TAKE 3 TABLETS (60 MG TOTAL) BY MOUTH 2 (TWO) TIMES DAILY., Disp: 180 tablet, Rfl: 11 .  losartan (COZAAR) 25 MG tablet, Take 1 tablet (25 mg total) by mouth 2 (two) times daily., Disp: 90 tablet, Rfl: 3 .  magnesium oxide (MAG-OX) 400 MG tablet, Take 1 tablet (400 mg total) by mouth 3 (three) times daily., Disp: 270 tablet, Rfl: 3 .  rivaroxaban (XARELTO) 20 MG TABS tablet, Take 1 tablet (20 mg total) by mouth daily with supper., Disp: 30 tablet, Rfl: 11 .  spironolactone (ALDACTONE) 25 MG tablet, Take 1 tablet (25 mg total) by mouth daily., Disp: 30 tablet, Rfl: 3 .  tizanidine (ZANAFLEX) 2 MG capsule, Take 1 capsule (2 mg total) by mouth at bedtime as needed (back pain)., Disp: 30 capsule, Rfl: 0 .  acetaminophen (TYLENOL) 500 MG tablet, Take 500 mg by mouth every 6 (six) hours as needed for moderate pain. , Disp: , Rfl:  .  budesonide (PULMICORT) 0.5 MG/2ML nebulizer solution, Take 2 mLs (0.5 mg total) by nebulization 2 (two) times daily., Disp: ,  Rfl: 12 .  levalbuterol (XOPENEX) 0.63 MG/3ML nebulizer solution, Take 3 mLs (0.63 mg total) by nebulization every 6 (six) hours as needed for wheezing or shortness of breath. (Patient not taking: Reported on 08/16/2018), Disp: 3 mL, Rfl: 12 .  ranitidine (ZANTAC) 150 MG capsule, Take 150 mg by mouth 2 (two) times daily as needed for heartburn. , Disp: , Rfl:  Allergies  Allergen Reactions  . Bee Venom Anaphylaxis  . Codeine Nausea And Vomiting  . Shrimp  [Shellfish Allergy] Swelling  . Tomato     Pt states it gives her gout      Social History   Socioeconomic History  . Marital status: Widowed    Spouse name: Not on file  . Number of children: 1  . Years of education: 58  . Highest education level: Not on file  Occupational History  . Not on file  Social Needs  . Financial resource strain: Not hard at all  . Food insecurity:    Worry: Never true    Inability: Never true  . Transportation needs:    Medical: No    Non-medical: No  Tobacco Use  . Smoking status: Former Smoker    Packs/day: 0.10    Years: 56.00    Pack years: 5.60    Types: Cigarettes    Last attempt to quit: 2019    Years since quitting: 1.0  . Smokeless tobacco: Never Used  Substance and Sexual Activity  . Alcohol use: No    Alcohol/week: 0.0 standard drinks  . Drug use: No  . Sexual activity: Not on file  Lifestyle  . Physical activity:    Days per week: Not on file    Minutes per session: Not on file  . Stress: Not on file  Relationships  . Social connections:    Talks on phone: Not on file    Gets together: Not on file    Attends religious service: Not on file    Active member of club or organization: Not on file    Attends meetings of clubs or organizations: Not on file    Relationship status: Not on file  . Intimate partner violence:    Fear of current or ex partner: Not on file    Emotionally abused: Not on file    Physically abused: Not on file    Forced sexual activity: Not on file  Other Topics Concern  . Not on file  Social History Narrative   Patient reports it being difficult to pay for everything due to outstanding medical bills from hospital stay last year but states she is able to keep up with basic expenses.  Patient does have some concerns with her house's condition due to a water leak- had her roof repaired last month but has some leaking in the front which has affected her porch.  Patient owns her own car and is able to  drive herself but has some concerns about the reliability of her car- has gotten taxi to the clinic for appointment in the past when her car broke down.    Physical Exam Cardiovascular:     Rate and Rhythm: Normal rate and regular rhythm.  Pulmonary:     Effort: Pulmonary effort is normal.     Breath sounds: Normal breath sounds.  Musculoskeletal: Normal range of motion.     Right lower leg: Edema present.     Left lower leg: Edema present.  Skin:    General: Skin is warm and  dry.     Capillary Refill: Capillary refill takes less than 2 seconds.  Neurological:     Mental Status: She is alert.  Psychiatric:        Mood and Affect: Mood normal.         Future Appointments  Date Time Provider [place]  09/07/2018 10:30 AM [facility] NM2/TREAD [id number]  11/01/2018 12:00 PM [facility] PA/NP [facility] None    BP 124/78 (BP Location: Left Arm, Patient Position: Sitting, Cuff Size: Large)   Pulse 82   Resp 16   Wt 223 lb (101.2 kg)   SpO2 97%   BMI 34.41 kg/m   Weight yesterday- Did not weigh Last visit weight- 220 lb  Ms Sparrow was seen at home today and rpeorted feeling well. She denied chest pain, SOB, headache, dizziness or orthopnea. She has been compliant with her medications over the past week and her weight has been stable. She is still complaining of bilateral hamstring pain when she wakes up in the morning and has consulted her PCP but did not feel heard. I suggested she try stretching before bed and when she waked up in the morning and she said she already does exercises before getting out of bed. Her medications were verified and her pillbox was refilled.   [name], EMT 08/24/18  ACTION: Home visit completed Next visit planned for 1 week

## 2018-08-30 ENCOUNTER — Telehealth (HOSPITAL_COMMUNITY): Payer: Self-pay

## 2018-08-30 NOTE — Telephone Encounter (Signed)
I called [name] [name] to see if she was available for a visit today. She did not answer so I left a voicemail requesting she call me back. Since I was close by her house, I stopped by a knocked on the door however nobody was home.

## 2018-08-31 ENCOUNTER — Other Ambulatory Visit (HOSPITAL_COMMUNITY): Payer: Self-pay

## 2018-08-31 NOTE — Progress Notes (Signed)
Paramedicine Encounter    Patient ID: [name] [name], female    DOB: 12/26/1945, 73 y.o.   MRN: [id number]   Patient Care Team: [name], MD as PCP - General (Internal Medicine) Debara Pickett Nadean Corwin, MD as PCP - Cardiology (Cardiology) Debara Pickett Nadean Corwin, MD as Consulting Physician (Cardiology) Jorge Ny, LCSW as Social Worker (Licensed Clinical Social Worker)  Patient Active Problem List   Diagnosis Date Noted  . Postmenopausal vaginal bleeding 01/04/2018  . Snoring 01/04/2018  . Acute on chronic combined systolic and diastolic CHF (congestive heart failure) ([place])   . CHF exacerbation ([place]) 10/16/2017  . Noncompliance 10/16/2017  . Acute on chronic congestive heart failure ([place])   . Influenza with respiratory manifestation flu a + 09/14/16 started on tamiflu 09/15/17 09/19/2017  . Thrombocytopenia ([place]) 09/14/2017  . Acute drug-induced gout of right foot   . Medication noncompliance due to cognitive impairment 09/06/2017  . Acute diastolic CHF (congestive heart failure) ([place]) 08/06/2017  . LGI bleed, likely hemorrhoids 08/06/2017  . Atrial fibrillation ([place]) 08/04/2017  . Paroxysmal atrial fibrillation ([place]) 02/08/2017  . Cardiomyopathy, ischemic 02/08/2017  . Dysphagia 02/08/2017  . CKD (chronic kidney disease), stage III ([place]) 01/30/2017  . Pain in shoulder 02/08/2016  . Breast pain, left 02/08/2016  . Bilateral arm numbness and tingling while sleeping 02/08/2016  . Painful lumpy left breast 09/23/2015  . Candidal intertrigo 02/11/2015  . S/P CABG x 2 11/14/2014  . Accelerated hypertension   . Cardiac arrest ([place]) 11/04/2014  . Left main coronary artery disease 11/04/2014  . Coronary artery disease due to lipid rich plaque   . Acute respiratory failure with hypoxemia ([place])   . Essential hypertension   . Atrial fibrillation with RVR ([place])   . Hypokalemia 10/30/2014  . Chronic diastolic CHF (congestive heart failure) ([place]) 10/30/2014  . NSTEMI (non-ST elevated myocardial  infarction) ([place])   . DOE (dyspnea on exertion)   . CAP (community acquired pneumonia) 10/29/2014    Current Outpatient Medications:  .  acetaminophen (TYLENOL) 500 MG tablet, Take 500 mg by mouth every 6 (six) hours as needed for moderate pain. , Disp: , Rfl:  .  allopurinol (ZYLOPRIM) 300 MG tablet, Take 1 tablet (300 mg total) by mouth daily., Disp: 30 tablet, Rfl: 11 .  atorvastatin (LIPITOR) 10 MG tablet, Take 10 mg by mouth daily., Disp: , Rfl:  .  budesonide (PULMICORT) 0.5 MG/2ML nebulizer solution, Take 2 mLs (0.5 mg total) by nebulization 2 (two) times daily., Disp: , Rfl: 12 .  carvedilol (COREG) 3.125 MG tablet, Take 1 tablet (3.125 mg total) by mouth 2 (two) times daily with a meal., Disp: 60 tablet, Rfl: 3 .  furosemide (LASIX) 20 MG tablet, TAKE 3 TABLETS (60 MG TOTAL) BY MOUTH 2 (TWO) TIMES DAILY., Disp: 180 tablet, Rfl: 11 .  losartan (COZAAR) 25 MG tablet, Take 1 tablet (25 mg total) by mouth 2 (two) times daily., Disp: 90 tablet, Rfl: 3 .  magnesium oxide (MAG-OX) 400 MG tablet, Take 1 tablet (400 mg total) by mouth 3 (three) times daily., Disp: 270 tablet, Rfl: 3 .  rivaroxaban (XARELTO) 20 MG TABS tablet, Take 1 tablet (20 mg total) by mouth daily with supper., Disp: 30 tablet, Rfl: 11 .  spironolactone (ALDACTONE) 25 MG tablet, Take 1 tablet (25 mg total) by mouth daily., Disp: 30 tablet, Rfl: 3 .  levalbuterol (XOPENEX) 0.63 MG/3ML nebulizer solution, Take 3 mLs (0.63 mg total) by nebulization every 6 (six) hours as needed for  wheezing or shortness of breath. (Patient not taking: Reported on 08/16/2018), Disp: 3 mL, Rfl: 12 .  ranitidine (ZANTAC) 150 MG capsule, Take 150 mg by mouth 2 (two) times daily as needed for heartburn. , Disp: , Rfl:  .  tizanidine (ZANAFLEX) 2 MG capsule, Take 1 capsule (2 mg total) by mouth at bedtime as needed (back pain). (Patient not taking: Reported on 08/31/2018), Disp: 30 capsule, Rfl: 0 Allergies  Allergen Reactions  . Bee Venom Anaphylaxis   . Codeine Nausea And Vomiting  . Shrimp [Shellfish Allergy] Swelling  . Tomato     Pt states it gives her gout      Social History   Socioeconomic History  . Marital status: Widowed    Spouse name: Not on file  . Number of children: 1  . Years of education: 77  . Highest education level: Not on file  Occupational History  . Not on file  Social Needs  . Financial resource strain: Not hard at all  . Food insecurity:    Worry: Never true    Inability: Never true  . Transportation needs:    Medical: No    Non-medical: No  Tobacco Use  . Smoking status: Former Smoker    Packs/day: 0.10    Years: 56.00    Pack years: 5.60    Types: Cigarettes    Last attempt to quit: 2019    Years since quitting: 1.0  . Smokeless tobacco: Never Used  Substance and Sexual Activity  . Alcohol use: No    Alcohol/week: 0.0 standard drinks  . Drug use: No  . Sexual activity: Not on file  Lifestyle  . Physical activity:    Days per week: Not on file    Minutes per session: Not on file  . Stress: Not on file  Relationships  . Social connections:    Talks on phone: Not on file    Gets together: Not on file    Attends religious service: Not on file    Active member of club or organization: Not on file    Attends meetings of clubs or organizations: Not on file    Relationship status: Not on file  . Intimate partner violence:    Fear of current or ex partner: Not on file    Emotionally abused: Not on file    Physically abused: Not on file    Forced sexual activity: Not on file  Other Topics Concern  . Not on file  Social History Narrative   Patient reports it being difficult to pay for everything due to outstanding medical bills from hospital stay last year but states she is able to keep up with basic expenses.  Patient does have some concerns with her house's condition due to a water leak- had her roof repaired last month but has some leaking in the front which has affected her porch.   Patient owns her own car and is able to drive herself but has some concerns about the reliability of her car- has gotten taxi to the clinic for appointment in the past when her car broke down.    Physical Exam Cardiovascular:     Rate and Rhythm: Normal rate and regular rhythm.     Pulses: Normal pulses.  Pulmonary:     Effort: Pulmonary effort is normal.     Breath sounds: Normal breath sounds.  Musculoskeletal:     Right lower leg: Edema present.     Left lower leg: Edema present.  Skin:    General: Skin is warm and dry.     Capillary Refill: Capillary refill takes less than 2 seconds.  Neurological:     Mental Status: She is alert and oriented to person, place, and time.  Psychiatric:        Mood and Affect: Mood normal.         Future Appointments  Date Time Provider [place]  09/07/2018 10:30 AM [facility] NM2/TREAD [id number]  11/01/2018 12:00 PM [facility] PA/NP [facility] None    BP 130/80 (BP Location: Left Arm, Patient Position: Sitting, Cuff Size: Normal)   Pulse 64   Resp 16   SpO2 98%   Weight yesterday- Did not weigh Last visit weight- 223 lb  Ms Mccown was seen at home today and reported feeling well. She denied SOB, headache, dizziness or orthopnea. 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 I encouraged her to make time to take care of herself so she does not end up sick or in the hospital. Her medications were verified and her pillbox was refilled.   [name], EMT 08/31/18  ACTION: Home visit completed Next visit planned for 1 week

## 2018-09-04 ENCOUNTER — Telehealth (HOSPITAL_COMMUNITY): Payer: Self-pay | Admitting: *Deleted

## 2018-09-04 NOTE — Telephone Encounter (Signed)
Left message on voicemail in reference to upcoming appointment scheduled for 09/07/18. Phone number given for a call back so details instructions can be given. Sage Hammill, Ranae [place]

## 2018-09-05 ENCOUNTER — Telehealth (HOSPITAL_COMMUNITY): Payer: Self-pay | Admitting: *Deleted

## 2018-09-05 NOTE — Telephone Encounter (Signed)
Left message on voicemail in reference to upcoming appointment scheduled for 09/07/18. Phone number given for a call back so details instructions can be given. Lonza Shimabukuro, Ranae Palms

## 2018-09-06 ENCOUNTER — Telehealth (HOSPITAL_COMMUNITY): Payer: Self-pay

## 2018-09-06 NOTE — Telephone Encounter (Signed)
Patient given detailed instructions per Myocardial Perfusion Study Information Sheet for the test on 09/07/2018 at 1030. Patient notified to arrive 15 minutes early and that it is imperative to arrive on time for appointment to keep from having the test rescheduled.  If you need to cancel or reschedule your appointment, please call the office within 24 hours of your appointment. . Patient verbalized understanding.TMY

## 2018-09-07 ENCOUNTER — Ambulatory Visit (HOSPITAL_COMMUNITY): Payer: PPO | Attending: Cardiovascular Disease

## 2018-09-07 ENCOUNTER — Telehealth (HOSPITAL_COMMUNITY): Payer: Self-pay

## 2018-09-07 ENCOUNTER — Other Ambulatory Visit (HOSPITAL_COMMUNITY): Payer: Self-pay

## 2018-09-07 DIAGNOSIS — I5032 Chronic diastolic (congestive) heart failure: Secondary | ICD-10-CM | POA: Diagnosis not present

## 2018-09-07 LAB — MYOCARDIAL PERFUSION IMAGING
LV dias vol: 107 mL (ref 46–106)
LV sys vol: 60 mL
Peak HR: 96 {beats}/min
Rest HR: 58 {beats}/min
SDS: 5
SRS: 2
SSS: 6
TID: 0.86

## 2018-09-07 MED ORDER — TECHNETIUM TC 99M TETROFOSMIN IV KIT
10.2000 | PACK | Freq: Once | INTRAVENOUS | Status: AC | PRN
  Administered 2018-09-07: 10.2 via INTRAVENOUS
  Filled 2018-09-07: qty 11

## 2018-09-07 MED ORDER — REGADENOSON 0.4 MG/5ML IV SOLN
0.4000 mg | Freq: Once | INTRAVENOUS | Status: AC
  Administered 2018-09-07: 0.4 mg via INTRAVENOUS

## 2018-09-07 MED ORDER — TECHNETIUM TC 99M TETROFOSMIN IV KIT
32.6000 | PACK | Freq: Once | INTRAVENOUS | Status: AC | PRN
  Administered 2018-09-07: 32.6 via INTRAVENOUS
  Filled 2018-09-07: qty 33

## 2018-09-07 NOTE — Telephone Encounter (Signed)
I received a voicemail from [name] [name] stating that she is on the way to her stress test and she needed to cancel because she was having chest pain. I tried to call her back twice but she did not answer. I left a voicemail requesting that she call me back as soon as possible.

## 2018-09-07 NOTE — Progress Notes (Signed)
Paramedicine Encounter    Patient ID: [name] [name], female    DOB: [birth year], 73 y.o.   MRN: [id number]   Patient Care Team: [name], MD as PCP - General (Internal Medicine) Debara Pickett Nadean Corwin, MD as PCP - Cardiology (Cardiology) Debara Pickett Nadean Corwin, MD as Consulting Physician (Cardiology) Jorge Ny, LCSW as Social Worker (Licensed Clinical Social Worker)  Patient Active Problem List   Diagnosis Date Noted  . Postmenopausal vaginal bleeding 01/04/2018  . Snoring 01/04/2018  . Acute on chronic combined systolic and diastolic CHF (congestive heart failure) ([place])   . CHF exacerbation ([place]) 10/16/2017  . Noncompliance 10/16/2017  . Acute on chronic congestive heart failure ([place])   . Influenza with respiratory manifestation flu a + 09/14/16 started on tamiflu 09/15/17 09/19/2017  . Thrombocytopenia ([place]) 09/14/2017  . Acute drug-induced gout of right foot   . Medication noncompliance due to cognitive impairment 09/06/2017  . Acute diastolic CHF (congestive heart failure) ([place]) 08/06/2017  . LGI bleed, likely hemorrhoids 08/06/2017  . Atrial fibrillation ([place]) 08/04/2017  . Paroxysmal atrial fibrillation ([place]) 02/08/2017  . Cardiomyopathy, ischemic 02/08/2017  . Dysphagia 02/08/2017  . CKD (chronic kidney disease), stage III ([place]) 01/30/2017  . Pain in shoulder 02/08/2016  . Breast pain, left 02/08/2016  . Bilateral arm numbness and tingling while sleeping 02/08/2016  . Painful lumpy left breast 09/23/2015  . Candidal intertrigo 02/11/2015  . S/P CABG x 2 11/14/2014  . Accelerated hypertension   . Cardiac arrest ([place]) 11/04/2014  . Left main coronary artery disease 11/04/2014  . Coronary artery disease due to lipid rich plaque   . Acute respiratory failure with hypoxemia ([place])   . Essential hypertension   . Atrial fibrillation with RVR ([place])   . Hypokalemia 10/30/2014  . Chronic diastolic CHF (congestive heart failure) ([place]) 10/30/2014  . NSTEMI (non-ST elevated myocardial  infarction) ([place])   . DOE (dyspnea on exertion)   . CAP (community acquired pneumonia) 10/29/2014    Current Outpatient Medications:  .  acetaminophen (TYLENOL) 500 MG tablet, Take 500 mg by mouth every 6 (six) hours as needed for moderate pain. , Disp: , Rfl:  .  allopurinol (ZYLOPRIM) 300 MG tablet, Take 1 tablet (300 mg total) by mouth daily., Disp: 30 tablet, Rfl: 11 .  atorvastatin (LIPITOR) 10 MG tablet, Take 10 mg by mouth daily., Disp: , Rfl:  .  budesonide (PULMICORT) 0.5 MG/2ML nebulizer solution, Take 2 mLs (0.5 mg total) by nebulization 2 (two) times daily., Disp: , Rfl: 12 .  carvedilol (COREG) 3.125 MG tablet, Take 1 tablet (3.125 mg total) by mouth 2 (two) times daily with a meal., Disp: 60 tablet, Rfl: 3 .  furosemide (LASIX) 20 MG tablet, TAKE 3 TABLETS (60 MG TOTAL) BY MOUTH 2 (TWO) TIMES DAILY., Disp: 180 tablet, Rfl: 11 .  levalbuterol (XOPENEX) 0.63 MG/3ML nebulizer solution, Take 3 mLs (0.63 mg total) by nebulization every 6 (six) hours as needed for wheezing or shortness of breath. (Patient not taking: Reported on 08/16/2018), Disp: 3 mL, Rfl: 12 .  losartan (COZAAR) 25 MG tablet, Take 1 tablet (25 mg total) by mouth 2 (two) times daily., Disp: 90 tablet, Rfl: 3 .  magnesium oxide (MAG-OX) 400 MG tablet, Take 1 tablet (400 mg total) by mouth 3 (three) times daily., Disp: 270 tablet, Rfl: 3 .  ranitidine (ZANTAC) 150 MG capsule, Take 150 mg by mouth 2 (two) times daily as needed for heartburn. , Disp: , Rfl:  .  rivaroxaban (XARELTO)  20 MG TABS tablet, Take 1 tablet (20 mg total) by mouth daily with supper., Disp: 30 tablet, Rfl: 11 .  spironolactone (ALDACTONE) 25 MG tablet, Take 1 tablet (25 mg total) by mouth daily., Disp: 30 tablet, Rfl: 3 .  tizanidine (ZANAFLEX) 2 MG capsule, Take 1 capsule (2 mg total) by mouth at bedtime as needed (back pain). (Patient not taking: Reported on 08/31/2018), Disp: 30 capsule, Rfl: 0 Allergies  Allergen Reactions  . Bee Venom Anaphylaxis   . Codeine Nausea And Vomiting  . Shrimp [Shellfish Allergy] Swelling  . Tomato     Pt states it gives her gout      Social History   Socioeconomic History  . Marital status: Widowed    Spouse name: Not on file  . Number of children: 1  . Years of education: 90  . Highest education level: Not on file  Occupational History  . Not on file  Social Needs  . Financial resource strain: Not hard at all  . Food insecurity:    Worry: Never true    Inability: Never true  . Transportation needs:    Medical: No    Non-medical: No  Tobacco Use  . Smoking status: Former Smoker    Packs/day: 0.10    Years: 56.00    Pack years: 5.60    Types: Cigarettes    Last attempt to quit: 2019    Years since quitting: 1.0  . Smokeless tobacco: Never Used  Substance and Sexual Activity  . Alcohol use: No    Alcohol/week: 0.0 standard drinks  . Drug use: No  . Sexual activity: Not on file  Lifestyle  . Physical activity:    Days per week: Not on file    Minutes per session: Not on file  . Stress: Not on file  Relationships  . Social connections:    Talks on phone: Not on file    Gets together: Not on file    Attends religious service: Not on file    Active member of club or organization: Not on file    Attends meetings of clubs or organizations: Not on file    Relationship status: Not on file  . Intimate partner violence:    Fear of current or ex partner: Not on file    Emotionally abused: Not on file    Physically abused: Not on file    Forced sexual activity: Not on file  Other Topics Concern  . Not on file  Social History Narrative   Patient reports it being difficult to pay for everything due to outstanding medical bills from hospital stay last year but states she is able to keep up with basic expenses.  Patient does have some concerns with her house's condition due to a water leak- had her roof repaired last month but has some leaking in the front which has affected her porch.   Patient owns her own car and is able to drive herself but has some concerns about the reliability of her car- has gotten taxi to the clinic for appointment in the past when her car broke down.    Physical Exam      Future Appointments  Date Time Provider [place]  11/01/2018 12:00 PM [facility] PA/NP [facility] None    There were no vitals taken for this visit.  Weight yesterday- Did not weigh Last visit weight- Did not weigh  [name] [name] was seen at home today following her cardiac stress test. She reported having  some chest discomfort but stated it has been there all day, including while she was at the cardiologist's office having the stress test. She stated she has been compliant with her medications but she has not been weighing because she has "a lot going on." I stressed the importance of weighing daily and she said she would get back on track. Her medications were verified and her pillbox was refilled. Since she was just seen at the doctor's office today, neither vital signs or physical exam were completed.   [name], EMT 09/07/18  ACTION: Home visit completed Next visit planned for 1 week

## 2018-09-12 ENCOUNTER — Telehealth (HOSPITAL_COMMUNITY): Payer: Self-pay

## 2018-09-14 ENCOUNTER — Telehealth (HOSPITAL_COMMUNITY): Payer: Self-pay

## 2018-09-14 DIAGNOSIS — I4891 Unspecified atrial fibrillation: Secondary | ICD-10-CM | POA: Diagnosis not present

## 2018-09-14 DIAGNOSIS — I214 Non-ST elevation (NSTEMI) myocardial infarction: Secondary | ICD-10-CM | POA: Diagnosis not present

## 2018-09-14 DIAGNOSIS — M1990 Unspecified osteoarthritis, unspecified site: Secondary | ICD-10-CM | POA: Diagnosis not present

## 2018-09-14 DIAGNOSIS — I5020 Unspecified systolic (congestive) heart failure: Secondary | ICD-10-CM | POA: Diagnosis not present

## 2018-09-14 DIAGNOSIS — I25810 Atherosclerosis of coronary artery bypass graft(s) without angina pectoris: Secondary | ICD-10-CM | POA: Diagnosis not present

## 2018-09-14 DIAGNOSIS — J449 Chronic obstructive pulmonary disease, unspecified: Secondary | ICD-10-CM | POA: Diagnosis not present

## 2018-09-14 DIAGNOSIS — I10 Essential (primary) hypertension: Secondary | ICD-10-CM | POA: Diagnosis not present

## 2018-09-14 DIAGNOSIS — N183 Chronic kidney disease, stage 3 (moderate): Secondary | ICD-10-CM | POA: Diagnosis not present

## 2018-09-14 DIAGNOSIS — I509 Heart failure, unspecified: Secondary | ICD-10-CM | POA: Diagnosis not present

## 2018-09-14 NOTE — Telephone Encounter (Signed)
I called [name] [name] to schedule an appointment. She stated she was tired from running errands and he brother being in the hospital so she requested I come on Friday. I agreed and stated I would call her Friday to finalize a time to meet. She was agreeable.

## 2018-09-14 NOTE — Telephone Encounter (Signed)
I called [name] [name] to see if I could come by for a visit. She did not answer so I left a voicemail requesting she call me back.

## 2018-09-18 ENCOUNTER — Other Ambulatory Visit (HOSPITAL_COMMUNITY): Payer: Self-pay

## 2018-09-18 NOTE — Progress Notes (Signed)
Paramedicine Encounter    Patient ID: [name] [name], female    DOB: 08-Jul-1946, 73 y.o.   MRN: [id number]   Patient Care Team: [name], MD as PCP - General (Internal Medicine) Debara Pickett Nadean Corwin, MD as PCP - Cardiology (Cardiology) Debara Pickett Nadean Corwin, MD as Consulting Physician (Cardiology) Jorge Ny, LCSW as Social Worker (Licensed Clinical Social Worker)  Patient Active Problem List   Diagnosis Date Noted  . Postmenopausal vaginal bleeding 01/04/2018  . Snoring 01/04/2018  . Acute on chronic combined systolic and diastolic CHF (congestive heart failure) ([place])   . CHF exacerbation ([place]) 10/16/2017  . Noncompliance 10/16/2017  . Acute on chronic congestive heart failure ([place])   . Influenza with respiratory manifestation flu a + 09/14/16 started on tamiflu 09/15/17 09/19/2017  . Thrombocytopenia ([place]) 09/14/2017  . Acute drug-induced gout of right foot   . Medication noncompliance due to cognitive impairment 09/06/2017  . Acute diastolic CHF (congestive heart failure) ([place]) 08/06/2017  . LGI bleed, likely hemorrhoids 08/06/2017  . Atrial fibrillation ([place]) 08/04/2017  . Paroxysmal atrial fibrillation ([place]) 02/08/2017  . Cardiomyopathy, ischemic 02/08/2017  . Dysphagia 02/08/2017  . CKD (chronic kidney disease), stage III ([place]) 01/30/2017  . Pain in shoulder 02/08/2016  . Breast pain, left 02/08/2016  . Bilateral arm numbness and tingling while sleeping 02/08/2016  . Painful lumpy left breast 09/23/2015  . Candidal intertrigo 02/11/2015  . S/P CABG x 2 11/14/2014  . Accelerated hypertension   . Cardiac arrest ([place]) 11/04/2014  . Left main coronary artery disease 11/04/2014  . Coronary artery disease due to lipid rich plaque   . Acute respiratory failure with hypoxemia ([place])   . Essential hypertension   . Atrial fibrillation with RVR ([place])   . Hypokalemia 10/30/2014  . Chronic diastolic CHF (congestive heart failure) ([place]) 10/30/2014  . NSTEMI (non-ST elevated myocardial  infarction) ([place])   . DOE (dyspnea on exertion)   . CAP (community acquired pneumonia) 10/29/2014    Current Outpatient Medications:  .  acetaminophen (TYLENOL) 500 MG tablet, Take 500 mg by mouth every 6 (six) hours as needed for moderate pain. , Disp: , Rfl:  .  allopurinol (ZYLOPRIM) 300 MG tablet, Take 1 tablet (300 mg total) by mouth daily., Disp: 30 tablet, Rfl: 11 .  atorvastatin (LIPITOR) 10 MG tablet, Take 10 mg by mouth daily., Disp: , Rfl:  .  carvedilol (COREG) 3.125 MG tablet, Take 1 tablet (3.125 mg total) by mouth 2 (two) times daily with a meal., Disp: 60 tablet, Rfl: 3 .  furosemide (LASIX) 20 MG tablet, TAKE 3 TABLETS (60 MG TOTAL) BY MOUTH 2 (TWO) TIMES DAILY., Disp: 180 tablet, Rfl: 11 .  losartan (COZAAR) 25 MG tablet, Take 1 tablet (25 mg total) by mouth 2 (two) times daily., Disp: 90 tablet, Rfl: 3 .  magnesium oxide (MAG-OX) 400 MG tablet, Take 1 tablet (400 mg total) by mouth 3 (three) times daily., Disp: 270 tablet, Rfl: 3 .  rivaroxaban (XARELTO) 20 MG TABS tablet, Take 1 tablet (20 mg total) by mouth daily with supper., Disp: 30 tablet, Rfl: 11 .  spironolactone (ALDACTONE) 25 MG tablet, Take 1 tablet (25 mg total) by mouth daily., Disp: 30 tablet, Rfl: 3 .  budesonide (PULMICORT) 0.5 MG/2ML nebulizer solution, Take 2 mLs (0.5 mg total) by nebulization 2 (two) times daily. (Patient not taking: Reported on 09/07/2018), Disp: , Rfl: 12 .  levalbuterol (XOPENEX) 0.63 MG/3ML nebulizer solution, Take 3 mLs (0.63 mg total) by nebulization every  6 (six) hours as needed for wheezing or shortness of breath. (Patient not taking: Reported on 08/16/2018), Disp: 3 mL, Rfl: 12 .  ranitidine (ZANTAC) 150 MG capsule, Take 150 mg by mouth 2 (two) times daily as needed for heartburn. , Disp: , Rfl:  .  tizanidine (ZANAFLEX) 2 MG capsule, Take 1 capsule (2 mg total) by mouth at bedtime as needed (back pain). (Patient not taking: Reported on 08/31/2018), Disp: 30 capsule, Rfl: 0 Allergies   Allergen Reactions  . Bee Venom Anaphylaxis  . Codeine Nausea And Vomiting  . Shrimp [Shellfish Allergy] Swelling  . Tomato     Pt states it gives her gout      Social History   Socioeconomic History  . Marital status: Widowed    Spouse name: Not on file  . Number of children: 1  . Years of education: 15  . Highest education level: Not on file  Occupational History  . Not on file  Social Needs  . Financial resource strain: Not hard at all  . Food insecurity:    Worry: Never true    Inability: Never true  . Transportation needs:    Medical: No    Non-medical: No  Tobacco Use  . Smoking status: Former Smoker    Packs/day: 0.10    Years: 56.00    Pack years: 5.60    Types: Cigarettes    Last attempt to quit: 2019    Years since quitting: 1.0  . Smokeless tobacco: Never Used  Substance and Sexual Activity  . Alcohol use: No    Alcohol/week: 0.0 standard drinks  . Drug use: No  . Sexual activity: Not on file  Lifestyle  . Physical activity:    Days per week: Not on file    Minutes per session: Not on file  . Stress: Not on file  Relationships  . Social connections:    Talks on phone: Not on file    Gets together: Not on file    Attends religious service: Not on file    Active member of club or organization: Not on file    Attends meetings of clubs or organizations: Not on file    Relationship status: Not on file  . Intimate partner violence:    Fear of current or ex partner: Not on file    Emotionally abused: Not on file    Physically abused: Not on file    Forced sexual activity: Not on file  Other Topics Concern  . Not on file  Social History Narrative   Patient reports it being difficult to pay for everything due to outstanding medical bills from hospital stay last year but states she is able to keep up with basic expenses.  Patient does have some concerns with her house's condition due to a water leak- had her roof repaired last month but has some  leaking in the front which has affected her porch.  Patient owns her own car and is able to drive herself but has some concerns about the reliability of her car- has gotten taxi to the clinic for appointment in the past when her car broke down.    Physical Exam Cardiovascular:     Rate and Rhythm: Normal rate and regular rhythm.     Pulses: Normal pulses.  Pulmonary:     Effort: Pulmonary effort is normal.     Breath sounds: Normal breath sounds.  Musculoskeletal: Normal range of motion.     Right lower leg: Edema  present.     Left lower leg: Edema present.  Skin:    General: Skin is warm and dry.     Capillary Refill: Capillary refill takes less than 2 seconds.  Neurological:     Mental Status: She is alert and oriented to person, place, and time.  Psychiatric:        Mood and Affect: Mood normal.         Future Appointments  Date Time Provider [place]  11/01/2018 12:00 PM [facility] PA/NP [facility] None    BP 130/76 (BP Location: Left Arm, Patient Position: Sitting, Cuff Size: Large)   Pulse 70   Resp 16   Wt 223 lb (101.2 kg)   SpO2 97%   BMI 34.41 kg/m   Weight yesterday- did not weigh Last visit weight- did not weigh  [name] [name] was seen at home today and reported feeling well. She complained of intermittent chest discomfort since yesterday and stated she believed it was from the vinegar she put in her salad yesterday or the cold she felt like she was coming down with. She denied SOB, headache, dizziness or orthopnea. She has not been weighing regularly due to being busy with her ill brother but stated she would start being better about it. Her medications were verified and her pillbox was refilled.   [name], EMT 09/18/18  ACTION: Home visit completed Next visit planned for 1 week

## 2018-09-21 ENCOUNTER — Telehealth (HOSPITAL_COMMUNITY): Payer: Self-pay

## 2018-09-21 NOTE — Telephone Encounter (Signed)
Spoke with pt. Aware of results. Reminded pt of her upcoming appointment.

## 2018-09-26 ENCOUNTER — Other Ambulatory Visit (HOSPITAL_COMMUNITY): Payer: Self-pay

## 2018-09-26 NOTE — Progress Notes (Signed)
Paramedicine Encounter    Patient ID: [name] [name], female    DOB: 02-05-1946, 73 y.o.   MRN: [id number]   Patient Care Team: [name], MD as PCP - General (Internal Medicine) Debara Pickett Nadean Corwin, MD as PCP - Cardiology (Cardiology) Debara Pickett Nadean Corwin, MD as Consulting Physician (Cardiology) Jorge Ny, LCSW as Social Worker (Licensed Clinical Social Worker)  Patient Active Problem List   Diagnosis Date Noted  . Postmenopausal vaginal bleeding 01/04/2018  . Snoring 01/04/2018  . Acute on chronic combined systolic and diastolic CHF (congestive heart failure) ([place])   . CHF exacerbation ([place]) 10/16/2017  . Noncompliance 10/16/2017  . Acute on chronic congestive heart failure ([place])   . Influenza with respiratory manifestation flu a + 09/14/16 started on tamiflu 09/15/17 09/19/2017  . Thrombocytopenia ([place]) 09/14/2017  . Acute drug-induced gout of right foot   . Medication noncompliance due to cognitive impairment 09/06/2017  . Acute diastolic CHF (congestive heart failure) ([place]) 08/06/2017  . LGI bleed, likely hemorrhoids 08/06/2017  . Atrial fibrillation ([place]) 08/04/2017  . Paroxysmal atrial fibrillation ([place]) 02/08/2017  . Cardiomyopathy, ischemic 02/08/2017  . Dysphagia 02/08/2017  . CKD (chronic kidney disease), stage III ([place]) 01/30/2017  . Pain in shoulder 02/08/2016  . Breast pain, left 02/08/2016  . Bilateral arm numbness and tingling while sleeping 02/08/2016  . Painful lumpy left breast 09/23/2015  . Candidal intertrigo 02/11/2015  . S/P CABG x 2 11/14/2014  . Accelerated hypertension   . Cardiac arrest ([place]) 11/04/2014  . Left main coronary artery disease 11/04/2014  . Coronary artery disease due to lipid rich plaque   . Acute respiratory failure with hypoxemia ([place])   . Essential hypertension   . Atrial fibrillation with RVR ([place])   . Hypokalemia 10/30/2014  . Chronic diastolic CHF (congestive heart failure) ([place]) 10/30/2014  . NSTEMI (non-ST elevated myocardial  infarction) ([place])   . [name] (dyspnea on exertion)   . CAP (community acquired pneumonia) 10/29/2014    Current Outpatient Medications:  .  allopurinol (ZYLOPRIM) 300 MG tablet, Take 1 tablet (300 mg total) by mouth daily., Disp: 30 tablet, Rfl: 11 .  atorvastatin (LIPITOR) 10 MG tablet, Take 10 mg by mouth daily., Disp: , Rfl:  .  carvedilol (COREG) 3.125 MG tablet, Take 1 tablet (3.125 mg total) by mouth 2 (two) times daily with a meal., Disp: 60 tablet, Rfl: 3 .  furosemide (LASIX) 20 MG tablet, TAKE 3 TABLETS (60 MG TOTAL) BY MOUTH 2 (TWO) TIMES DAILY., Disp: 180 tablet, Rfl: 11 .  losartan (COZAAR) 25 MG tablet, Take 1 tablet (25 mg total) by mouth 2 (two) times daily., Disp: 90 tablet, Rfl: 3 .  magnesium oxide (MAG-OX) 400 MG tablet, Take 1 tablet (400 mg total) by mouth 3 (three) times daily., Disp: 270 tablet, Rfl: 3 .  rivaroxaban (XARELTO) 20 MG TABS tablet, Take 1 tablet (20 mg total) by mouth daily with supper., Disp: 30 tablet, Rfl: 11 .  spironolactone (ALDACTONE) 25 MG tablet, Take 1 tablet (25 mg total) by mouth daily., Disp: 30 tablet, Rfl: 3 .  acetaminophen (TYLENOL) 500 MG tablet, Take 500 mg by mouth every 6 (six) hours as needed for moderate pain. , Disp: , Rfl:  .  budesonide (PULMICORT) 0.5 MG/2ML nebulizer solution, Take 2 mLs (0.5 mg total) by nebulization 2 (two) times daily. (Patient not taking: Reported on 09/07/2018), Disp: , Rfl: 12 .  levalbuterol (XOPENEX) 0.63 MG/3ML nebulizer solution, Take 3 mLs (0.63 mg total) by nebulization every  6 (six) hours as needed for wheezing or shortness of breath. (Patient not taking: Reported on 08/16/2018), Disp: 3 mL, Rfl: 12 .  ranitidine (ZANTAC) 150 MG capsule, Take 150 mg by mouth 2 (two) times daily as needed for heartburn. , Disp: , Rfl:  .  tizanidine (ZANAFLEX) 2 MG capsule, Take 1 capsule (2 mg total) by mouth at bedtime as needed (back pain). (Patient not taking: Reported on 08/31/2018), Disp: 30 capsule, Rfl: 0 Allergies   Allergen Reactions  . Bee Venom Anaphylaxis  . Codeine Nausea And Vomiting  . Shrimp [Shellfish Allergy] Swelling  . Tomato     Pt states it gives her gout      Social History   Socioeconomic History  . Marital status: Widowed    Spouse name: Not on file  . Number of children: 1  . Years of education: 54  . Highest education level: Not on file  Occupational History  . Not on file  Social Needs  . Financial resource strain: Not hard at all  . Food insecurity:    Worry: Never true    Inability: Never true  . Transportation needs:    Medical: No    Non-medical: No  Tobacco Use  . Smoking status: Former Smoker    Packs/day: 0.10    Years: 56.00    Pack years: 5.60    Types: Cigarettes    Last attempt to quit: 2019    Years since quitting: 1.1  . Smokeless tobacco: Never Used  Substance and Sexual Activity  . Alcohol use: No    Alcohol/week: 0.0 standard drinks  . Drug use: No  . Sexual activity: Not on file  Lifestyle  . Physical activity:    Days per week: Not on file    Minutes per session: Not on file  . Stress: Not on file  Relationships  . Social connections:    Talks on phone: Not on file    Gets together: Not on file    Attends religious service: Not on file    Active member of club or organization: Not on file    Attends meetings of clubs or organizations: Not on file    Relationship status: Not on file  . Intimate partner violence:    Fear of current or ex partner: Not on file    Emotionally abused: Not on file    Physically abused: Not on file    Forced sexual activity: Not on file  Other Topics Concern  . Not on file  Social History Narrative   Patient reports it being difficult to pay for everything due to outstanding medical bills from hospital stay last year but states she is able to keep up with basic expenses.  Patient does have some concerns with her house's condition due to a water leak- had her roof repaired last month but has some  leaking in the front which has affected her porch.  Patient owns her own car and is able to drive herself but has some concerns about the reliability of her car- has gotten taxi to the clinic for appointment in the past when her car broke down.    Physical Exam Exam conducted with a chaperone present.  Cardiovascular:     Rate and Rhythm: Normal rate and regular rhythm.     Pulses: Normal pulses.  Pulmonary:     Effort: Pulmonary effort is normal.     Breath sounds: Normal breath sounds.  Musculoskeletal: Normal range of motion.  Right lower leg: Edema present.     Left lower leg: Edema present.  Skin:    General: Skin is warm and dry.     Capillary Refill: Capillary refill takes less than 2 seconds.  Neurological:     Mental Status: She is alert and oriented to person, place, and time.  Psychiatric:        Mood and Affect: Mood normal.         Future Appointments  Date Time Provider [place]  11/01/2018 12:00 PM [facility] PA/NP [facility] None    BP (!) 158/76 (BP Location: Right Arm, Patient Position: Sitting, Cuff Size: Large)   Pulse 60   Resp 16   Wt 220 lb 9.6 oz (100.1 kg)   SpO2 98%   BMI 34.04 kg/m   Weight yesterday- Did not weigh Last visit weight- 223 lb  [name] [name] was seen at home today and reported feeling well. She denied chest pain, SOB, headache, dizziness or orthopnea. She stated she has been compliant with her medications over the past week though she had not taken them yet today. Her weight was noted to be stable. Her medications were verified and her pillbox was refilled.   [name] [name], EMT 09/26/18  ACTION: Home visit completed Next visit planned for 1 week

## 2018-10-02 ENCOUNTER — Other Ambulatory Visit (HOSPITAL_COMMUNITY): Payer: Self-pay

## 2018-10-02 NOTE — Progress Notes (Signed)
Paramedicine Encounter    Patient ID: [name] [name], female    DOB: Dec 30, 1945, 73 y.o.   MRN: [id number]   Patient Care Team: [name], MD as PCP - General (Internal Medicine) Debara Pickett Nadean Corwin, MD as PCP - Cardiology (Cardiology) Debara Pickett Nadean Corwin, MD as Consulting Physician (Cardiology) Jorge Ny, LCSW as Social Worker (Licensed Clinical Social Worker)  Patient Active Problem List   Diagnosis Date Noted  . Postmenopausal vaginal bleeding 01/04/2018  . Snoring 01/04/2018  . Acute on chronic combined systolic and diastolic CHF (congestive heart failure) ([place])   . CHF exacerbation ([place]) 10/16/2017  . Noncompliance 10/16/2017  . Acute on chronic congestive heart failure ([place])   . Influenza with respiratory manifestation flu a + 09/14/16 started on tamiflu 09/15/17 09/19/2017  . Thrombocytopenia ([place]) 09/14/2017  . Acute drug-induced gout of right foot   . Medication noncompliance due to cognitive impairment 09/06/2017  . Acute diastolic CHF (congestive heart failure) ([place]) 08/06/2017  . LGI bleed, likely hemorrhoids 08/06/2017  . Atrial fibrillation ([place]) 08/04/2017  . Paroxysmal atrial fibrillation ([place]) 02/08/2017  . Cardiomyopathy, ischemic 02/08/2017  . Dysphagia 02/08/2017  . CKD (chronic kidney disease), stage III ([place]) 01/30/2017  . Pain in shoulder 02/08/2016  . Breast pain, left 02/08/2016  . Bilateral arm numbness and tingling while sleeping 02/08/2016  . Painful lumpy left breast 09/23/2015  . Candidal intertrigo 02/11/2015  . S/P CABG x 2 11/14/2014  . Accelerated hypertension   . Cardiac arrest ([place]) 11/04/2014  . Left main coronary artery disease 11/04/2014  . Coronary artery disease due to lipid rich plaque   . Acute respiratory failure with hypoxemia ([place])   . Essential hypertension   . Atrial fibrillation with RVR ([place])   . Hypokalemia 10/30/2014  . Chronic diastolic CHF (congestive heart failure) ([place]) 10/30/2014  . NSTEMI (non-ST elevated myocardial  infarction) ([place])   . DOE (dyspnea on exertion)   . CAP (community acquired pneumonia) 10/29/2014    Current Outpatient Medications:  .  allopurinol (ZYLOPRIM) 300 MG tablet, Take 1 tablet (300 mg total) by mouth daily., Disp: 30 tablet, Rfl: 11 .  atorvastatin (LIPITOR) 10 MG tablet, Take 10 mg by mouth daily., Disp: , Rfl:  .  carvedilol (COREG) 3.125 MG tablet, Take 1 tablet (3.125 mg total) by mouth 2 (two) times daily with a meal., Disp: 60 tablet, Rfl: 3 .  furosemide (LASIX) 20 MG tablet, TAKE 3 TABLETS (60 MG TOTAL) BY MOUTH 2 (TWO) TIMES DAILY., Disp: 180 tablet, Rfl: 11 .  losartan (COZAAR) 25 MG tablet, Take 1 tablet (25 mg total) by mouth 2 (two) times daily., Disp: 90 tablet, Rfl: 3 .  magnesium oxide (MAG-OX) 400 MG tablet, Take 1 tablet (400 mg total) by mouth 3 (three) times daily., Disp: 270 tablet, Rfl: 3 .  rivaroxaban (XARELTO) 20 MG TABS tablet, Take 1 tablet (20 mg total) by mouth daily with supper., Disp: 30 tablet, Rfl: 11 .  spironolactone (ALDACTONE) 25 MG tablet, Take 1 tablet (25 mg total) by mouth daily., Disp: 30 tablet, Rfl: 3 .  acetaminophen (TYLENOL) 500 MG tablet, Take 500 mg by mouth every 6 (six) hours as needed for moderate pain. , Disp: , Rfl:  .  budesonide (PULMICORT) 0.5 MG/2ML nebulizer solution, Take 2 mLs (0.5 mg total) by nebulization 2 (two) times daily. (Patient not taking: Reported on 09/07/2018), Disp: , Rfl: 12 .  levalbuterol (XOPENEX) 0.63 MG/3ML nebulizer solution, Take 3 mLs (0.63 mg total) by nebulization every  6 (six) hours as needed for wheezing or shortness of breath. (Patient not taking: Reported on 08/16/2018), Disp: 3 mL, Rfl: 12 .  ranitidine (ZANTAC) 150 MG capsule, Take 150 mg by mouth 2 (two) times daily as needed for heartburn. , Disp: , Rfl:  .  tizanidine (ZANAFLEX) 2 MG capsule, Take 1 capsule (2 mg total) by mouth at bedtime as needed (back pain). (Patient not taking: Reported on 08/31/2018), Disp: 30 capsule, Rfl: 0 Allergies   Allergen Reactions  . Bee Venom Anaphylaxis  . Codeine Nausea And Vomiting  . Shrimp [Shellfish Allergy] Swelling  . Tomato     Pt states it gives her gout      Social History   Socioeconomic History  . Marital status: Widowed    Spouse name: Not on file  . Number of children: 1  . Years of education: 80  . Highest education level: Not on file  Occupational History  . Not on file  Social Needs  . Financial resource strain: Not hard at all  . Food insecurity:    Worry: Never true    Inability: Never true  . Transportation needs:    Medical: No    Non-medical: No  Tobacco Use  . Smoking status: Former Smoker    Packs/day: 0.10    Years: 56.00    Pack years: 5.60    Types: Cigarettes    Last attempt to quit: 2019    Years since quitting: 1.1  . Smokeless tobacco: Never Used  Substance and Sexual Activity  . Alcohol use: No    Alcohol/week: 0.0 standard drinks  . Drug use: No  . Sexual activity: Not on file  Lifestyle  . Physical activity:    Days per week: Not on file    Minutes per session: Not on file  . Stress: Not on file  Relationships  . Social connections:    Talks on phone: Not on file    Gets together: Not on file    Attends religious service: Not on file    Active member of club or organization: Not on file    Attends meetings of clubs or organizations: Not on file    Relationship status: Not on file  . Intimate partner violence:    Fear of current or ex partner: Not on file    Emotionally abused: Not on file    Physically abused: Not on file    Forced sexual activity: Not on file  Other Topics Concern  . Not on file  Social History Narrative   Patient reports it being difficult to pay for everything due to outstanding medical bills from hospital stay last year but states she is able to keep up with basic expenses.  Patient does have some concerns with her house's condition due to a water leak- had her roof repaired last month but has some  leaking in the front which has affected her porch.  Patient owns her own car and is able to drive herself but has some concerns about the reliability of her car- has gotten taxi to the clinic for appointment in the past when her car broke down.    Physical Exam Cardiovascular:     Rate and Rhythm: Normal rate and regular rhythm.     Pulses: Normal pulses.  Pulmonary:     Effort: Pulmonary effort is normal.     Breath sounds: Normal breath sounds.  Musculoskeletal: Normal range of motion.     Right lower leg: Edema  present.     Left lower leg: Edema present.  Skin:    General: Skin is warm and dry.     Capillary Refill: Capillary refill takes less than 2 seconds.  Neurological:     Mental Status: She is alert and oriented to person, place, and time.  Psychiatric:        Mood and Affect: Mood normal.         Future Appointments  Date Time Provider [place]  11/01/2018 12:00 PM [facility] PA/NP [facility] None    BP 122/70 (BP Location: Left Arm, Patient Position: Sitting, Cuff Size: Large)   Pulse 69   Resp 16   Wt 222 lb (100.7 kg)   SpO2 95%   BMI 34.26 kg/m   Weight yesterday- 225 lb Last visit weight- 220 lb  Ms Paulson was seen at home today and reported feeling well. She denied SOB, headache, dizziness or orthopnea. She stated she has been compliant with her medications over the past week and her weight has been stable. Her medications were verified and her pillbox was refilled.   [name], EMT 10/02/18  ACTION: Home visit completed Next visit planned for 1 week

## 2018-10-09 ENCOUNTER — Other Ambulatory Visit (HOSPITAL_COMMUNITY): Payer: Self-pay

## 2018-10-09 DIAGNOSIS — I509 Heart failure, unspecified: Secondary | ICD-10-CM | POA: Diagnosis not present

## 2018-10-09 DIAGNOSIS — J449 Chronic obstructive pulmonary disease, unspecified: Secondary | ICD-10-CM | POA: Diagnosis not present

## 2018-10-09 DIAGNOSIS — N183 Chronic kidney disease, stage 3 (moderate): Secondary | ICD-10-CM | POA: Diagnosis not present

## 2018-10-09 DIAGNOSIS — M1990 Unspecified osteoarthritis, unspecified site: Secondary | ICD-10-CM | POA: Diagnosis not present

## 2018-10-09 DIAGNOSIS — I5020 Unspecified systolic (congestive) heart failure: Secondary | ICD-10-CM | POA: Diagnosis not present

## 2018-10-09 DIAGNOSIS — I4891 Unspecified atrial fibrillation: Secondary | ICD-10-CM | POA: Diagnosis not present

## 2018-10-09 DIAGNOSIS — I214 Non-ST elevation (NSTEMI) myocardial infarction: Secondary | ICD-10-CM | POA: Diagnosis not present

## 2018-10-09 DIAGNOSIS — I10 Essential (primary) hypertension: Secondary | ICD-10-CM | POA: Diagnosis not present

## 2018-10-09 DIAGNOSIS — I25810 Atherosclerosis of coronary artery bypass graft(s) without angina pectoris: Secondary | ICD-10-CM | POA: Diagnosis not present

## 2018-10-09 NOTE — Progress Notes (Signed)
Paramedicine Encounter    Patient ID: [name] [name], female    DOB: 09-23-1945, 73 y.o.   MRN: [id number]   Patient Care Team: [name], MD as PCP - General (Internal Medicine) Debara Pickett Nadean Corwin, MD as PCP - Cardiology (Cardiology) Debara Pickett Nadean Corwin, MD as Consulting Physician (Cardiology) Jorge Ny, LCSW as Social Worker (Licensed Clinical Social Worker)  Patient Active Problem List   Diagnosis Date Noted  . Postmenopausal vaginal bleeding 01/04/2018  . Snoring 01/04/2018  . Acute on chronic combined systolic and diastolic CHF (congestive heart failure) ([place])   . CHF exacerbation ([place]) 10/16/2017  . Noncompliance 10/16/2017  . Acute on chronic congestive heart failure ([place])   . Influenza with respiratory manifestation flu a + 09/14/16 started on tamiflu 09/15/17 09/19/2017  . Thrombocytopenia ([place]) 09/14/2017  . Acute drug-induced gout of right foot   . Medication noncompliance due to cognitive impairment 09/06/2017  . Acute diastolic CHF (congestive heart failure) ([place]) 08/06/2017  . LGI bleed, likely hemorrhoids 08/06/2017  . Atrial fibrillation ([place]) 08/04/2017  . Paroxysmal atrial fibrillation ([place]) 02/08/2017  . Cardiomyopathy, ischemic 02/08/2017  . Dysphagia 02/08/2017  . CKD (chronic kidney disease), stage III ([place]) 01/30/2017  . Pain in shoulder 02/08/2016  . Breast pain, left 02/08/2016  . Bilateral arm numbness and tingling while sleeping 02/08/2016  . Painful lumpy left breast 09/23/2015  . Candidal intertrigo 02/11/2015  . S/P CABG x 2 11/14/2014  . Accelerated hypertension   . Cardiac arrest ([place]) 11/04/2014  . Left main coronary artery disease 11/04/2014  . Coronary artery disease due to lipid rich plaque   . Acute respiratory failure with hypoxemia ([place])   . Essential hypertension   . Atrial fibrillation with RVR ([place])   . Hypokalemia 10/30/2014  . Chronic diastolic CHF (congestive heart failure) ([place]) 10/30/2014  . NSTEMI (non-ST elevated myocardial  infarction) ([place])   . DOE (dyspnea on exertion)   . CAP (community acquired pneumonia) 10/29/2014    Current Outpatient Medications:  .  allopurinol (ZYLOPRIM) 300 MG tablet, Take 1 tablet (300 mg total) by mouth daily., Disp: 30 tablet, Rfl: 11 .  atorvastatin (LIPITOR) 10 MG tablet, Take 10 mg by mouth daily., Disp: , Rfl:  .  carvedilol (COREG) 3.125 MG tablet, Take 1 tablet (3.125 mg total) by mouth 2 (two) times daily with a meal., Disp: 60 tablet, Rfl: 3 .  furosemide (LASIX) 20 MG tablet, TAKE 3 TABLETS (60 MG TOTAL) BY MOUTH 2 (TWO) TIMES DAILY., Disp: 180 tablet, Rfl: 11 .  losartan (COZAAR) 25 MG tablet, Take 1 tablet (25 mg total) by mouth 2 (two) times daily., Disp: 90 tablet, Rfl: 3 .  magnesium oxide (MAG-OX) 400 MG tablet, Take 1 tablet (400 mg total) by mouth 3 (three) times daily., Disp: 270 tablet, Rfl: 3 .  ranitidine (ZANTAC) 150 MG capsule, Take 150 mg by mouth 2 (two) times daily as needed for heartburn. , Disp: , Rfl:  .  rivaroxaban (XARELTO) 20 MG TABS tablet, Take 1 tablet (20 mg total) by mouth daily with supper., Disp: 30 tablet, Rfl: 11 .  spironolactone (ALDACTONE) 25 MG tablet, Take 1 tablet (25 mg total) by mouth daily., Disp: 30 tablet, Rfl: 3 .  acetaminophen (TYLENOL) 500 MG tablet, Take 500 mg by mouth every 6 (six) hours as needed for moderate pain. , Disp: , Rfl:  .  budesonide (PULMICORT) 0.5 MG/2ML nebulizer solution, Take 2 mLs (0.5 mg total) by nebulization 2 (two) times daily. (Patient not  taking: Reported on 09/07/2018), Disp: , Rfl: 12 .  levalbuterol (XOPENEX) 0.63 MG/3ML nebulizer solution, Take 3 mLs (0.63 mg total) by nebulization every 6 (six) hours as needed for wheezing or shortness of breath. (Patient not taking: Reported on 08/16/2018), Disp: 3 mL, Rfl: 12 .  tizanidine (ZANAFLEX) 2 MG capsule, Take 1 capsule (2 mg total) by mouth at bedtime as needed (back pain). (Patient not taking: Reported on 08/31/2018), Disp: 30 capsule, Rfl: 0 Allergies   Allergen Reactions  . Bee Venom Anaphylaxis  . Codeine Nausea And Vomiting  . Shrimp [Shellfish Allergy] Swelling  . Tomato     Pt states it gives her gout      Social History   Socioeconomic History  . Marital status: Widowed    Spouse name: Not on file  . Number of children: 1  . Years of education: 6  . Highest education level: Not on file  Occupational History  . Not on file  Social Needs  . Financial resource strain: Not hard at all  . Food insecurity:    Worry: Never true    Inability: Never true  . Transportation needs:    Medical: No    Non-medical: No  Tobacco Use  . Smoking status: Former Smoker    Packs/day: 0.10    Years: 56.00    Pack years: 5.60    Types: Cigarettes    Last attempt to quit: 2019    Years since quitting: 1.1  . Smokeless tobacco: Never Used  Substance and Sexual Activity  . Alcohol use: No    Alcohol/week: 0.0 standard drinks  . Drug use: No  . Sexual activity: Not on file  Lifestyle  . Physical activity:    Days per week: Not on file    Minutes per session: Not on file  . Stress: Not on file  Relationships  . Social connections:    Talks on phone: Not on file    Gets together: Not on file    Attends religious service: Not on file    Active member of club or organization: Not on file    Attends meetings of clubs or organizations: Not on file    Relationship status: Not on file  . Intimate partner violence:    Fear of current or ex partner: Not on file    Emotionally abused: Not on file    Physically abused: Not on file    Forced sexual activity: Not on file  Other Topics Concern  . Not on file  Social History Narrative   Patient reports it being difficult to pay for everything due to outstanding medical bills from hospital stay last year but states she is able to keep up with basic expenses.  Patient does have some concerns with her house's condition due to a water leak- had her roof repaired last month but has some  leaking in the front which has affected her porch.  Patient owns her own car and is able to drive herself but has some concerns about the reliability of her car- has gotten taxi to the clinic for appointment in the past when her car broke down.    Physical Exam Cardiovascular:     Rate and Rhythm: Normal rate and regular rhythm.     Pulses: Normal pulses.  Pulmonary:     Effort: Pulmonary effort is normal.     Breath sounds: Normal breath sounds.  Musculoskeletal: Normal range of motion.     Right lower leg: No  edema.     Left lower leg: No edema.  Skin:    General: Skin is warm and dry.     Capillary Refill: Capillary refill takes less than 2 seconds.  Neurological:     Mental Status: She is alert and oriented to person, place, and time.  Psychiatric:        Mood and Affect: Mood normal.         Future Appointments  Date Time Provider [place]  11/01/2018 12:00 PM [facility] PA/NP [facility] None    BP 120/72 (BP Location: Left Arm, Patient Position: Sitting, Cuff Size: Large)   Pulse 63   Resp 16   Wt 222 lb 12.8 oz (101.1 kg)   SpO2 98%   BMI 34.38 kg/m   Weight yesterday- Did not weigh Last visit weight- 222 lb  Ms Drawdy was seen at home today and reported feeling well. She denied SOB, headache, dizziness or orthopnea. She had been compliant with her medications and her weight today was the same as it was last week when she weight for me. She continues to forget to weigh herself daily. She had filled her pillbox without assistance prior to my arrival today and upon verifying it she had done it perfectly. I believe she is nearing the point of graduation but I will discuss this with her in the coming weeks.   [name], EMT 10/09/18  ACTION: Home visit completed Next visit planned for 1 week

## 2018-10-16 ENCOUNTER — Telehealth: Payer: Self-pay | Admitting: Internal Medicine

## 2018-10-16 ENCOUNTER — Telehealth (HOSPITAL_COMMUNITY): Payer: Self-pay

## 2018-10-16 DIAGNOSIS — J449 Chronic obstructive pulmonary disease, unspecified: Secondary | ICD-10-CM | POA: Diagnosis not present

## 2018-10-16 DIAGNOSIS — N183 Chronic kidney disease, stage 3 (moderate): Secondary | ICD-10-CM | POA: Diagnosis not present

## 2018-10-16 DIAGNOSIS — M109 Gout, unspecified: Secondary | ICD-10-CM | POA: Diagnosis not present

## 2018-10-16 DIAGNOSIS — I4891 Unspecified atrial fibrillation: Secondary | ICD-10-CM | POA: Diagnosis not present

## 2018-10-16 DIAGNOSIS — I5020 Unspecified systolic (congestive) heart failure: Secondary | ICD-10-CM | POA: Diagnosis not present

## 2018-10-16 DIAGNOSIS — I10 Essential (primary) hypertension: Secondary | ICD-10-CM | POA: Diagnosis not present

## 2018-10-16 DIAGNOSIS — K219 Gastro-esophageal reflux disease without esophagitis: Secondary | ICD-10-CM | POA: Diagnosis not present

## 2018-10-16 DIAGNOSIS — I25810 Atherosclerosis of coronary artery bypass graft(s) without angina pectoris: Secondary | ICD-10-CM | POA: Diagnosis not present

## 2018-10-16 DIAGNOSIS — R7303 Prediabetes: Secondary | ICD-10-CM | POA: Diagnosis not present

## 2018-10-16 DIAGNOSIS — E7800 Pure hypercholesterolemia, unspecified: Secondary | ICD-10-CM | POA: Diagnosis not present

## 2018-10-16 DIAGNOSIS — I700 Atherosclerosis of aorta: Secondary | ICD-10-CM | POA: Diagnosis not present

## 2018-10-16 NOTE — Telephone Encounter (Signed)
I called [name] [name] to schedule an appointment. She did not answer so I left a message requesting she call me back.

## 2018-10-16 NOTE — Telephone Encounter (Signed)
New Message   Patient is wanting to switch from Dr. Debara Pickett to [name]. Please advise of switch.

## 2018-10-17 NOTE — Telephone Encounter (Signed)
Fine with me.  Dr. H 

## 2018-10-17 NOTE — Telephone Encounter (Signed)
Okay with me [name] Portillo, MD  

## 2018-10-23 ENCOUNTER — Other Ambulatory Visit (HOSPITAL_COMMUNITY): Payer: Self-pay

## 2018-10-23 ENCOUNTER — Other Ambulatory Visit (HOSPITAL_COMMUNITY): Payer: Self-pay | Admitting: *Deleted

## 2018-10-23 MED ORDER — SPIRONOLACTONE 25 MG PO TABS: 25.0000 mg | ORAL_TABLET | Freq: Every day | ORAL | 6 refills | Status: DC

## 2018-10-23 NOTE — Progress Notes (Signed)
Paramedicine Encounter    Patient ID: [name] [name], female    DOB: [birth year], 73 y.o.   MRN: [id number]   Patient Care Team: [name], MD as PCP - General (Internal Medicine) Debara Pickett Nadean Corwin, MD as PCP - Cardiology (Cardiology) Debara Pickett Nadean Corwin, MD as Consulting Physician (Cardiology) Jorge Ny, LCSW as Social Worker (Licensed Clinical Social Worker)  Patient Active Problem List   Diagnosis Date Noted  . Postmenopausal vaginal bleeding 01/04/2018  . Snoring 01/04/2018  . Acute on chronic combined systolic and diastolic CHF (congestive heart failure) ([place])   . CHF exacerbation ([place]) 10/16/2017  . Noncompliance 10/16/2017  . Acute on chronic congestive heart failure ([place])   . Influenza with respiratory manifestation flu a + 09/14/16 started on tamiflu 09/15/17 09/19/2017  . Thrombocytopenia ([place]) 09/14/2017  . Acute drug-induced gout of right foot   . Medication noncompliance due to cognitive impairment 09/06/2017  . Acute diastolic CHF (congestive heart failure) ([place]) 08/06/2017  . LGI bleed, likely hemorrhoids 08/06/2017  . Atrial fibrillation ([place]) 08/04/2017  . Paroxysmal atrial fibrillation ([place]) 02/08/2017  . Cardiomyopathy, ischemic 02/08/2017  . Dysphagia 02/08/2017  . CKD (chronic kidney disease), stage III ([place]) 01/30/2017  . Pain in shoulder 02/08/2016  . Breast pain, left 02/08/2016  . Bilateral arm numbness and tingling while sleeping 02/08/2016  . Painful lumpy left breast 09/23/2015  . Candidal intertrigo 02/11/2015  . S/P CABG x 2 11/14/2014  . Accelerated hypertension   . Cardiac arrest ([place]) 11/04/2014  . Left main coronary artery disease 11/04/2014  . Coronary artery disease due to lipid rich plaque   . Acute respiratory failure with hypoxemia ([place])   . Essential hypertension   . Atrial fibrillation with RVR ([place])   . Hypokalemia 10/30/2014  . Chronic diastolic CHF (congestive heart failure) ([place]) 10/30/2014  . NSTEMI (non-ST elevated myocardial  infarction) ([place])   . DOE (dyspnea on exertion)   . CAP (community acquired pneumonia) 10/29/2014    Current Outpatient Medications:  .  allopurinol (ZYLOPRIM) 300 MG tablet, Take 1 tablet (300 mg total) by mouth daily., Disp: 30 tablet, Rfl: 11 .  atorvastatin (LIPITOR) 10 MG tablet, Take 10 mg by mouth daily., Disp: , Rfl:  .  carvedilol (COREG) 3.125 MG tablet, Take 1 tablet (3.125 mg total) by mouth 2 (two) times daily with a meal., Disp: 60 tablet, Rfl: 3 .  furosemide (LASIX) 20 MG tablet, TAKE 3 TABLETS (60 MG TOTAL) BY MOUTH 2 (TWO) TIMES DAILY., Disp: 180 tablet, Rfl: 11 .  losartan (COZAAR) 25 MG tablet, Take 1 tablet (25 mg total) by mouth 2 (two) times daily., Disp: 90 tablet, Rfl: 3 .  magnesium oxide (MAG-OX) 400 MG tablet, Take 1 tablet (400 mg total) by mouth 3 (three) times daily., Disp: 270 tablet, Rfl: 3 .  rivaroxaban (XARELTO) 20 MG TABS tablet, Take 1 tablet (20 mg total) by mouth daily with supper., Disp: 30 tablet, Rfl: 11 .  acetaminophen (TYLENOL) 500 MG tablet, Take 500 mg by mouth every 6 (six) hours as needed for moderate pain. , Disp: , Rfl:  .  budesonide (PULMICORT) 0.5 MG/2ML nebulizer solution, Take 2 mLs (0.5 mg total) by nebulization 2 (two) times daily. (Patient not taking: Reported on 09/07/2018), Disp: , Rfl: 12 .  levalbuterol (XOPENEX) 0.63 MG/3ML nebulizer solution, Take 3 mLs (0.63 mg total) by nebulization every 6 (six) hours as needed for wheezing or shortness of breath. (Patient not taking: Reported on 08/16/2018), Disp: 3 mL, Rfl:  12 .  ranitidine (ZANTAC) 150 MG capsule, Take 150 mg by mouth 2 (two) times daily as needed for heartburn. , Disp: , Rfl:  .  spironolactone (ALDACTONE) 25 MG tablet, Take 1 tablet (25 mg total) by mouth daily., Disp: 30 tablet, Rfl: 6 .  tizanidine (ZANAFLEX) 2 MG capsule, Take 1 capsule (2 mg total) by mouth at bedtime as needed (back pain). (Patient not taking: Reported on 08/31/2018), Disp: 30 capsule, Rfl: 0 Allergies   Allergen Reactions  . Bee Venom Anaphylaxis  . Codeine Nausea And Vomiting  . Shrimp [Shellfish Allergy] Swelling  . Tomato     Pt states it gives her gout      Social History   Socioeconomic History  . Marital status: Widowed    Spouse name: Not on file  . Number of children: 1  . Years of education: 35  . Highest education level: Not on file  Occupational History  . Not on file  Social Needs  . Financial resource strain: Not hard at all  . Food insecurity:    Worry: Never true    Inability: Never true  . Transportation needs:    Medical: No    Non-medical: No  Tobacco Use  . Smoking status: Former Smoker    Packs/day: 0.10    Years: 56.00    Pack years: 5.60    Types: Cigarettes    Last attempt to quit: 2019    Years since quitting: 1.1  . Smokeless tobacco: Never Used  Substance and Sexual Activity  . Alcohol use: No    Alcohol/week: 0.0 standard drinks  . Drug use: No  . Sexual activity: Not on file  Lifestyle  . Physical activity:    Days per week: Not on file    Minutes per session: Not on file  . Stress: Not on file  Relationships  . Social connections:    Talks on phone: Not on file    Gets together: Not on file    Attends religious service: Not on file    Active member of club or organization: Not on file    Attends meetings of clubs or organizations: Not on file    Relationship status: Not on file  . Intimate partner violence:    Fear of current or ex partner: Not on file    Emotionally abused: Not on file    Physically abused: Not on file    Forced sexual activity: Not on file  Other Topics Concern  . Not on file  Social History Narrative   Patient reports it being difficult to pay for everything due to outstanding medical bills from hospital stay last year but states she is able to keep up with basic expenses.  Patient does have some concerns with her house's condition due to a water leak- had her roof repaired last month but has some  leaking in the front which has affected her porch.  Patient owns her own car and is able to drive herself but has some concerns about the reliability of her car- has gotten taxi to the clinic for appointment in the past when her car broke down.    Physical Exam Cardiovascular:     Rate and Rhythm: Normal rate and regular rhythm.     Pulses: Normal pulses.  Pulmonary:     Effort: Pulmonary effort is normal.  Abdominal:     General: There is no distension.  Musculoskeletal: Normal range of motion.     Right lower  leg: Edema present.     Left lower leg: Edema present.  Skin:    General: Skin is warm and dry.     Capillary Refill: Capillary refill takes less than 2 seconds.  Neurological:     Mental Status: She is alert and oriented to person, place, and time.  Psychiatric:        Mood and Affect: Mood normal.         Future Appointments  Date Time Provider [place]  11/01/2018 12:00 PM [facility] PA/NP [facility] None  12/17/2018  3:00 PM Jerline Pain, MD [facility]    BP 130/78 (BP Location: Left Arm, Patient Position: Sitting, Cuff Size: Large)   Pulse 74   Resp 16   Wt 224 lb 6.4 oz (101.8 kg)   SpO2 96%   BMI 34.63 kg/m   Weight yesterday- Did not weigh Last visit weight- 222.8 lb  [name] [name] was seen at home today and reported feeling well with the exception of cold symptoms. She denied chest pain, SOB, headache, dizziness or orthopnea. She stated she had been compliant with her medications and her weight has been stable. At the end of last week, she received her first set of bubble packs for her medications. Today was the first day she was going to use them however an error was found. I contacted th pharmacy and made them aware of the issue and they advised they would be taking care of it. To circumvent this error, I took her medications out of the bubble packs and put them into her pillbox. I will follow up next week and will continue this until next  month when the bubble packs will be resent with the correct medication doses.  [name], EMT 10/23/18  ACTION: Home visit completed Next visit planned for 1 week

## 2018-10-30 ENCOUNTER — Other Ambulatory Visit (HOSPITAL_COMMUNITY): Payer: Self-pay

## 2018-10-30 NOTE — Progress Notes (Signed)
Paramedicine Encounter    Patient ID: [name] [name], female    DOB: Jul 22, 1946, 73 y.o.   MRN: [id number]   Patient Care Team: [name], MD as PCP - General (Internal Medicine) Debara Pickett Nadean Corwin, MD as PCP - Cardiology (Cardiology) Debara Pickett Nadean Corwin, MD as Consulting Physician (Cardiology) Jorge Ny, LCSW as Social Worker (Licensed Clinical Social Worker)  Patient Active Problem List   Diagnosis Date Noted  . Postmenopausal vaginal bleeding 01/04/2018  . Snoring 01/04/2018  . Acute on chronic combined systolic and diastolic CHF (congestive heart failure) ([place])   . CHF exacerbation ([place]) 10/16/2017  . Noncompliance 10/16/2017  . Acute on chronic congestive heart failure ([place])   . Influenza with respiratory manifestation flu a + 09/14/16 started on tamiflu 09/15/17 09/19/2017  . Thrombocytopenia ([place]) 09/14/2017  . Acute drug-induced gout of right foot   . Medication noncompliance due to cognitive impairment 09/06/2017  . Acute diastolic CHF (congestive heart failure) ([place]) 08/06/2017  . LGI bleed, likely hemorrhoids 08/06/2017  . Atrial fibrillation ([place]) 08/04/2017  . Paroxysmal atrial fibrillation ([place]) 02/08/2017  . Cardiomyopathy, ischemic 02/08/2017  . Dysphagia 02/08/2017  . CKD (chronic kidney disease), stage III ([place]) 01/30/2017  . Pain in shoulder 02/08/2016  . Breast pain, left 02/08/2016  . Bilateral arm numbness and tingling while sleeping 02/08/2016  . Painful lumpy left breast 09/23/2015  . Candidal intertrigo 02/11/2015  . S/P CABG x 2 11/14/2014  . Accelerated hypertension   . Cardiac arrest ([place]) 11/04/2014  . Left main coronary artery disease 11/04/2014  . Coronary artery disease due to lipid rich plaque   . Acute respiratory failure with hypoxemia ([place])   . Essential hypertension   . Atrial fibrillation with RVR ([place])   . Hypokalemia 10/30/2014  . Chronic diastolic CHF (congestive heart failure) ([place]) 10/30/2014  . NSTEMI (non-ST elevated myocardial  infarction) ([place])   . DOE (dyspnea on exertion)   . CAP (community acquired pneumonia) 10/29/2014    Current Outpatient Medications:  .  acetaminophen (TYLENOL) 500 MG tablet, Take 500 mg by mouth every 6 (six) hours as needed for moderate pain. , Disp: , Rfl:  .  allopurinol (ZYLOPRIM) 300 MG tablet, Take 1 tablet (300 mg total) by mouth daily., Disp: 30 tablet, Rfl: 11 .  atorvastatin (LIPITOR) 10 MG tablet, Take 10 mg by mouth daily., Disp: , Rfl:  .  carvedilol (COREG) 3.125 MG tablet, Take 1 tablet (3.125 mg total) by mouth 2 (two) times daily with a meal., Disp: 60 tablet, Rfl: 3 .  furosemide (LASIX) 20 MG tablet, TAKE 3 TABLETS (60 MG TOTAL) BY MOUTH 2 (TWO) TIMES DAILY., Disp: 180 tablet, Rfl: 11 .  losartan (COZAAR) 25 MG tablet, Take 1 tablet (25 mg total) by mouth 2 (two) times daily., Disp: 90 tablet, Rfl: 3 .  magnesium oxide (MAG-OX) 400 MG tablet, Take 1 tablet (400 mg total) by mouth 3 (three) times daily., Disp: 270 tablet, Rfl: 3 .  rivaroxaban (XARELTO) 20 MG TABS tablet, Take 1 tablet (20 mg total) by mouth daily with supper., Disp: 30 tablet, Rfl: 11 .  spironolactone (ALDACTONE) 25 MG tablet, Take 1 tablet (25 mg total) by mouth daily., Disp: 30 tablet, Rfl: 6 .  budesonide (PULMICORT) 0.5 MG/2ML nebulizer solution, Take 2 mLs (0.5 mg total) by nebulization 2 (two) times daily. (Patient not taking: Reported on 09/07/2018), Disp: , Rfl: 12 .  levalbuterol (XOPENEX) 0.63 MG/3ML nebulizer solution, Take 3 mLs (0.63 mg total) by nebulization every  6 (six) hours as needed for wheezing or shortness of breath. (Patient not taking: Reported on 08/16/2018), Disp: 3 mL, Rfl: 12 .  ranitidine (ZANTAC) 150 MG capsule, Take 150 mg by mouth 2 (two) times daily as needed for heartburn. , Disp: , Rfl:  .  tizanidine (ZANAFLEX) 2 MG capsule, Take 1 capsule (2 mg total) by mouth at bedtime as needed (back pain). (Patient not taking: Reported on 08/31/2018), Disp: 30 capsule, Rfl: 0 Allergies   Allergen Reactions  . Bee Venom Anaphylaxis  . Codeine Nausea And Vomiting  . Shrimp [Shellfish Allergy] Swelling  . Tomato     Pt states it gives her gout      Social History   Socioeconomic History  . Marital status: Widowed    Spouse name: Not on file  . Number of children: 1  . Years of education: 64  . Highest education level: Not on file  Occupational History  . Not on file  Social Needs  . Financial resource strain: Not hard at all  . Food insecurity:    Worry: Never true    Inability: Never true  . Transportation needs:    Medical: No    Non-medical: No  Tobacco Use  . Smoking status: Former Smoker    Packs/day: 0.10    Years: 56.00    Pack years: 5.60    Types: Cigarettes    Last attempt to quit: 2019    Years since quitting: 1.2  . Smokeless tobacco: Never Used  Substance and Sexual Activity  . Alcohol use: No    Alcohol/week: 0.0 standard drinks  . Drug use: No  . Sexual activity: Not on file  Lifestyle  . Physical activity:    Days per week: Not on file    Minutes per session: Not on file  . Stress: Not on file  Relationships  . Social connections:    Talks on phone: Not on file    Gets together: Not on file    Attends religious service: Not on file    Active member of club or organization: Not on file    Attends meetings of clubs or organizations: Not on file    Relationship status: Not on file  . Intimate partner violence:    Fear of current or ex partner: Not on file    Emotionally abused: Not on file    Physically abused: Not on file    Forced sexual activity: Not on file  Other Topics Concern  . Not on file  Social History Narrative   Patient reports it being difficult to pay for everything due to outstanding medical bills from hospital stay last year but states she is able to keep up with basic expenses.  Patient does have some concerns with her house's condition due to a water leak- had her roof repaired last month but has some  leaking in the front which has affected her porch.  Patient owns her own car and is able to drive herself but has some concerns about the reliability of her car- has gotten taxi to the clinic for appointment in the past when her car broke down.    Physical Exam Cardiovascular:     Rate and Rhythm: Normal rate and regular rhythm.     Pulses: Normal pulses.  Pulmonary:     Effort: Pulmonary effort is normal.     Breath sounds: Normal breath sounds.  Abdominal:     General: Abdomen is flat.  Musculoskeletal: Normal range  of motion.     Right lower leg: Edema present.     Left lower leg: Edema present.  Skin:    General: Skin is warm and dry.     Capillary Refill: Capillary refill takes less than 2 seconds.  Neurological:     Mental Status: She is alert and oriented to person, place, and time.  Psychiatric:        Mood and Affect: Mood normal.         Future Appointments  Date Time Provider [place]  11/01/2018 12:00 PM [facility] PA/NP [facility] None  12/17/2018  3:00 PM Jerline Pain, MD [facility]    BP 138/78 (BP Location: Left Arm, Patient Position: Sitting, Cuff Size: Large)   Pulse 66   Resp 18   Wt 225 lb 9.6 oz (102.3 kg)   SpO2 97%   BMI 34.81 kg/m   Weight yesterday- 222.6 lb Last visit weight- 224.4 lb  Ms Aldaz was seen at home today and reported feeling well as she recovers from a cold. She denied chest pain, SOB, headache, dizziness or orthopnea. She reported being compliant with her medications over the past week however she forgot them this morning. She also stated she did not take 25 mg of spironolactone because she thought I had made a mistake putting 2 halves in the same slot. I explained that this was to give her a full dose as it was increased at her last appointment. She also has started taking CoQ-10 daily but did not want them in her pillbox. Her medications were verified and her pillbox was refilled.   [name],  EMT 10/30/18  ACTION: Home visit completed Next visit planned for 1 week

## 2018-10-31 ENCOUNTER — Telehealth (HOSPITAL_COMMUNITY): Payer: Self-pay

## 2018-10-31 NOTE — Telephone Encounter (Signed)
Triage Appointment Phone Calls  NOTE : At this time, do not cancel [facility] Appointments.   Introduction: We are calling today to discuss your upcoming appointment. As you know we are dealing with the Coronavirus COVID-19 and want to take all precautions necessary to protect our patients and staff.   Questions 1. Have you or any one in your house had a fever in the past 2 weeks? no   2. How are you feeling/doing?  a. Is your weight stable? Yes B P stable if checking at home? Yes. zach checked it yesterday Have they needed extra diuretics? no Is their breathing at baseline? yes    3. Do you have enough of your cardiac medications to last you for the next 2 months, or do you need refills? yes 4. Do you have enough food and supplies to last for at least 2 weeks? yes   5. Reschedule their appointment with the following recommendations: new appt 12/13/2018 at 12:00pm               -> We will remain open, so please call right away if they are developing any worsening symptoms   -> Their appointment may be out 2 months, but depending on the course of the virus, we may open up appointments sooner   -> Encouraged them to sign up for my-chart and let them know we are looking at the possibility of virtual or telephone appointments  Spoke to pt. Pt adamant that she wanted to change her appointment during the current climate. Denies any unstable heart failure symptoms.  Reinforced to give [place] a call for any worsening of heart failure symptoms. Pt grateful for the call.

## 2018-11-01 ENCOUNTER — Encounter (HOSPITAL_COMMUNITY): Payer: PPO

## 2018-11-02 ENCOUNTER — Telehealth (HOSPITAL_COMMUNITY): Payer: Self-pay | Admitting: Licensed Clinical Social Worker

## 2018-11-02 NOTE — Telephone Encounter (Signed)
CSW reached out to pt to check in regarding food and medication status at this time. Unable to speak with pt directly- left voicemail.  CSW encouraged pt to reach out with any concerns and will continue to follow and assist as needed  Jorge Ny, [place] Worker [facility] [phone]

## 2018-11-06 ENCOUNTER — Other Ambulatory Visit (HOSPITAL_COMMUNITY): Payer: Self-pay

## 2018-11-06 NOTE — Progress Notes (Signed)
Paramedicine Encounter    Patient ID: [name] [name], female    DOB: 07/14/1946, 73 y.o.   MRN: [id number]   Patient Care Team: [name], MD as PCP - General (Internal Medicine) Debara Pickett Nadean Corwin, MD as PCP - Cardiology (Cardiology) Debara Pickett Nadean Corwin, MD as Consulting Physician (Cardiology) Jorge Ny, LCSW as Social Worker (Licensed Clinical Social Worker)  Patient Active Problem List   Diagnosis Date Noted  . Postmenopausal vaginal bleeding 01/04/2018  . Snoring 01/04/2018  . Acute on chronic combined systolic and diastolic CHF (congestive heart failure) ([place])   . CHF exacerbation ([place]) 10/16/2017  . Noncompliance 10/16/2017  . Acute on chronic congestive heart failure ([place])   . Influenza with respiratory manifestation flu a + 09/14/16 started on tamiflu 09/15/17 09/19/2017  . Thrombocytopenia ([place]) 09/14/2017  . Acute drug-induced gout of right foot   . Medication noncompliance due to cognitive impairment 09/06/2017  . Acute diastolic CHF (congestive heart failure) ([place]) 08/06/2017  . LGI bleed, likely hemorrhoids 08/06/2017  . Atrial fibrillation ([place]) 08/04/2017  . Paroxysmal atrial fibrillation ([place]) 02/08/2017  . Cardiomyopathy, ischemic 02/08/2017  . Dysphagia 02/08/2017  . CKD (chronic kidney disease), stage III ([place]) 01/30/2017  . Pain in shoulder 02/08/2016  . Breast pain, left 02/08/2016  . Bilateral arm numbness and tingling while sleeping 02/08/2016  . Painful lumpy left breast 09/23/2015  . Candidal intertrigo 02/11/2015  . S/P CABG x 2 11/14/2014  . Accelerated hypertension   . Cardiac arrest ([place]) 11/04/2014  . Left main coronary artery disease 11/04/2014  . Coronary artery disease due to lipid rich plaque   . Acute respiratory failure with hypoxemia ([place])   . Essential hypertension   . Atrial fibrillation with RVR ([place])   . Hypokalemia 10/30/2014  . Chronic diastolic CHF (congestive heart failure) ([place]) 10/30/2014  . NSTEMI (non-ST elevated myocardial  infarction) ([place])   . DOE (dyspnea on exertion)   . CAP (community acquired pneumonia) 10/29/2014    Current Outpatient Medications:  .  allopurinol (ZYLOPRIM) 300 MG tablet, Take 1 tablet (300 mg total) by mouth daily., Disp: 30 tablet, Rfl: 11 .  atorvastatin (LIPITOR) 10 MG tablet, Take 10 mg by mouth daily., Disp: , Rfl:  .  carvedilol (COREG) 3.125 MG tablet, Take 1 tablet (3.125 mg total) by mouth 2 (two) times daily with a meal., Disp: 60 tablet, Rfl: 3 .  furosemide (LASIX) 20 MG tablet, TAKE 3 TABLETS (60 MG TOTAL) BY MOUTH 2 (TWO) TIMES DAILY., Disp: 180 tablet, Rfl: 11 .  losartan (COZAAR) 25 MG tablet, Take 1 tablet (25 mg total) by mouth 2 (two) times daily., Disp: 90 tablet, Rfl: 3 .  magnesium oxide (MAG-OX) 400 MG tablet, Take 1 tablet (400 mg total) by mouth 3 (three) times daily., Disp: 270 tablet, Rfl: 3 .  rivaroxaban (XARELTO) 20 MG TABS tablet, Take 1 tablet (20 mg total) by mouth daily with supper., Disp: 30 tablet, Rfl: 11 .  spironolactone (ALDACTONE) 25 MG tablet, Take 1 tablet (25 mg total) by mouth daily., Disp: 30 tablet, Rfl: 6 .  acetaminophen (TYLENOL) 500 MG tablet, Take 500 mg by mouth every 6 (six) hours as needed for moderate pain. , Disp: , Rfl:  .  budesonide (PULMICORT) 0.5 MG/2ML nebulizer solution, Take 2 mLs (0.5 mg total) by nebulization 2 (two) times daily. (Patient not taking: Reported on 09/07/2018), Disp: , Rfl: 12 .  levalbuterol (XOPENEX) 0.63 MG/3ML nebulizer solution, Take 3 mLs (0.63 mg total) by nebulization every  6 (six) hours as needed for wheezing or shortness of breath. (Patient not taking: Reported on 08/16/2018), Disp: 3 mL, Rfl: 12 .  ranitidine (ZANTAC) 150 MG capsule, Take 150 mg by mouth 2 (two) times daily as needed for heartburn. , Disp: , Rfl:  .  tizanidine (ZANAFLEX) 2 MG capsule, Take 1 capsule (2 mg total) by mouth at bedtime as needed (back pain). (Patient not taking: Reported on 08/31/2018), Disp: 30 capsule, Rfl: 0 Allergies   Allergen Reactions  . Bee Venom Anaphylaxis  . Codeine Nausea And Vomiting  . Shrimp [Shellfish Allergy] Swelling  . Tomato     Pt states it gives her gout      Social History   Socioeconomic History  . Marital status: Widowed    Spouse name: Not on file  . Number of children: 1  . Years of education: 46  . Highest education level: Not on file  Occupational History  . Not on file  Social Needs  . Financial resource strain: Not hard at all  . Food insecurity:    Worry: Never true    Inability: Never true  . Transportation needs:    Medical: No    Non-medical: No  Tobacco Use  . Smoking status: Former Smoker    Packs/day: 0.10    Years: 56.00    Pack years: 5.60    Types: Cigarettes    Last attempt to quit: 2019    Years since quitting: 1.2  . Smokeless tobacco: Never Used  Substance and Sexual Activity  . Alcohol use: No    Alcohol/week: 0.0 standard drinks  . Drug use: No  . Sexual activity: Not on file  Lifestyle  . Physical activity:    Days per week: Not on file    Minutes per session: Not on file  . Stress: Not on file  Relationships  . Social connections:    Talks on phone: Not on file    Gets together: Not on file    Attends religious service: Not on file    Active member of club or organization: Not on file    Attends meetings of clubs or organizations: Not on file    Relationship status: Not on file  . Intimate partner violence:    Fear of current or ex partner: Not on file    Emotionally abused: Not on file    Physically abused: Not on file    Forced sexual activity: Not on file  Other Topics Concern  . Not on file  Social History Narrative   Patient reports it being difficult to pay for everything due to outstanding medical bills from hospital stay last year but states she is able to keep up with basic expenses.  Patient does have some concerns with her house's condition due to a water leak- had her roof repaired last month but has some  leaking in the front which has affected her porch.  Patient owns her own car and is able to drive herself but has some concerns about the reliability of her car- has gotten taxi to the clinic for appointment in the past when her car broke down.    Physical Exam Cardiovascular:     Rate and Rhythm: Normal rate and regular rhythm.     Pulses: Normal pulses.  Pulmonary:     Effort: Pulmonary effort is normal.     Breath sounds: Normal breath sounds.  Musculoskeletal: Normal range of motion.     Right lower leg: Edema  present.     Left lower leg: Edema present.  Skin:    General: Skin is warm and dry.     Capillary Refill: Capillary refill takes less than 2 seconds.  Neurological:     Mental Status: She is alert and oriented to person, place, and time.  Psychiatric:        Mood and Affect: Mood normal.         Future Appointments  Date Time Provider [place]  12/13/2018 12:00 PM [facility] PA/NP [facility] None  12/17/2018  3:00 PM Jerline Pain, MD [facility]    BP 135/73 (BP Location: Left Arm, Patient Position: Sitting, Cuff Size: Large)   Pulse 62   Resp 16   Wt 227 lb 9.6 oz (103.2 kg)   SpO2 97%   BMI 35.12 kg/m   Weight yesterday- Did not weigh Last visit weight- 225.6 lb  [name] [name] was seen at home today and reported feeling generally well. She denied chest pain, SOB, headache, dizziness or orthopnea. She stated she has been compliant with her medications over the past week and her weight has been stable. Her medications were verified and her pillbox was refilled. I will return later this week following her labs to stock coumadin appropriately.    [name], EMT 11/06/18  ACTION: Home visit completed Next visit planned for 1 week

## 2018-11-13 ENCOUNTER — Other Ambulatory Visit (HOSPITAL_COMMUNITY): Payer: Self-pay

## 2018-11-13 DIAGNOSIS — N183 Chronic kidney disease, stage 3 (moderate): Secondary | ICD-10-CM | POA: Diagnosis not present

## 2018-11-13 DIAGNOSIS — I10 Essential (primary) hypertension: Secondary | ICD-10-CM | POA: Diagnosis not present

## 2018-11-13 DIAGNOSIS — I214 Non-ST elevation (NSTEMI) myocardial infarction: Secondary | ICD-10-CM | POA: Diagnosis not present

## 2018-11-13 DIAGNOSIS — I25810 Atherosclerosis of coronary artery bypass graft(s) without angina pectoris: Secondary | ICD-10-CM | POA: Diagnosis not present

## 2018-11-13 DIAGNOSIS — J449 Chronic obstructive pulmonary disease, unspecified: Secondary | ICD-10-CM | POA: Diagnosis not present

## 2018-11-13 DIAGNOSIS — M1990 Unspecified osteoarthritis, unspecified site: Secondary | ICD-10-CM | POA: Diagnosis not present

## 2018-11-13 DIAGNOSIS — I4891 Unspecified atrial fibrillation: Secondary | ICD-10-CM | POA: Diagnosis not present

## 2018-11-13 DIAGNOSIS — I5020 Unspecified systolic (congestive) heart failure: Secondary | ICD-10-CM | POA: Diagnosis not present

## 2018-11-13 NOTE — Progress Notes (Signed)
Paramedicine Encounter    Patient ID: [name] [name], female    DOB: Jul 22, 1946, 73 y.o.   MRN: [id number]   Patient Care Team: [name], MD as PCP - General (Internal Medicine) Debara Pickett Nadean Corwin, MD as PCP - Cardiology (Cardiology) Debara Pickett Nadean Corwin, MD as Consulting Physician (Cardiology) Jorge Ny, LCSW as Social Worker (Licensed Clinical Social Worker)  Patient Active Problem List   Diagnosis Date Noted  . Postmenopausal vaginal bleeding 01/04/2018  . Snoring 01/04/2018  . Acute on chronic combined systolic and diastolic CHF (congestive heart failure) ([place])   . CHF exacerbation ([place]) 10/16/2017  . Noncompliance 10/16/2017  . Acute on chronic congestive heart failure ([place])   . Influenza with respiratory manifestation flu a + 09/14/16 started on tamiflu 09/15/17 09/19/2017  . Thrombocytopenia ([place]) 09/14/2017  . Acute drug-induced gout of right foot   . Medication noncompliance due to cognitive impairment 09/06/2017  . Acute diastolic CHF (congestive heart failure) ([place]) 08/06/2017  . LGI bleed, likely hemorrhoids 08/06/2017  . Atrial fibrillation ([place]) 08/04/2017  . Paroxysmal atrial fibrillation ([place]) 02/08/2017  . Cardiomyopathy, ischemic 02/08/2017  . Dysphagia 02/08/2017  . CKD (chronic kidney disease), stage III ([place]) 01/30/2017  . Pain in shoulder 02/08/2016  . Breast pain, left 02/08/2016  . Bilateral arm numbness and tingling while sleeping 02/08/2016  . Painful lumpy left breast 09/23/2015  . Candidal intertrigo 02/11/2015  . S/P CABG x 2 11/14/2014  . Accelerated hypertension   . Cardiac arrest ([place]) 11/04/2014  . Left main coronary artery disease 11/04/2014  . Coronary artery disease due to lipid rich plaque   . Acute respiratory failure with hypoxemia ([place])   . Essential hypertension   . Atrial fibrillation with RVR ([place])   . Hypokalemia 10/30/2014  . Chronic diastolic CHF (congestive heart failure) ([place]) 10/30/2014  . NSTEMI (non-ST elevated myocardial  infarction) ([place])   . DOE (dyspnea on exertion)   . CAP (community acquired pneumonia) 10/29/2014    Current Outpatient Medications:  .  acetaminophen (TYLENOL) 500 MG tablet, Take 500 mg by mouth every 6 (six) hours as needed for moderate pain. , Disp: , Rfl:  .  allopurinol (ZYLOPRIM) 300 MG tablet, Take 1 tablet (300 mg total) by mouth daily., Disp: 30 tablet, Rfl: 11 .  atorvastatin (LIPITOR) 10 MG tablet, Take 10 mg by mouth daily., Disp: , Rfl:  .  carvedilol (COREG) 3.125 MG tablet, Take 1 tablet (3.125 mg total) by mouth 2 (two) times daily with a meal., Disp: 60 tablet, Rfl: 3 .  furosemide (LASIX) 20 MG tablet, TAKE 3 TABLETS (60 MG TOTAL) BY MOUTH 2 (TWO) TIMES DAILY., Disp: 180 tablet, Rfl: 11 .  losartan (COZAAR) 25 MG tablet, Take 1 tablet (25 mg total) by mouth 2 (two) times daily., Disp: 90 tablet, Rfl: 3 .  magnesium oxide (MAG-OX) 400 MG tablet, Take 1 tablet (400 mg total) by mouth 3 (three) times daily., Disp: 270 tablet, Rfl: 3 .  rivaroxaban (XARELTO) 20 MG TABS tablet, Take 1 tablet (20 mg total) by mouth daily with supper., Disp: 30 tablet, Rfl: 11 .  spironolactone (ALDACTONE) 25 MG tablet, Take 1 tablet (25 mg total) by mouth daily., Disp: 30 tablet, Rfl: 6 .  budesonide (PULMICORT) 0.5 MG/2ML nebulizer solution, Take 2 mLs (0.5 mg total) by nebulization 2 (two) times daily. (Patient not taking: Reported on 09/07/2018), Disp: , Rfl: 12 .  levalbuterol (XOPENEX) 0.63 MG/3ML nebulizer solution, Take 3 mLs (0.63 mg total) by nebulization every  6 (six) hours as needed for wheezing or shortness of breath. (Patient not taking: Reported on 08/16/2018), Disp: 3 mL, Rfl: 12 .  ranitidine (ZANTAC) 150 MG capsule, Take 150 mg by mouth 2 (two) times daily as needed for heartburn. , Disp: , Rfl:  .  tizanidine (ZANAFLEX) 2 MG capsule, Take 1 capsule (2 mg total) by mouth at bedtime as needed (back pain). (Patient not taking: Reported on 08/31/2018), Disp: 30 capsule, Rfl: 0 Allergies   Allergen Reactions  . Bee Venom Anaphylaxis  . Codeine Nausea And Vomiting  . Shrimp [Shellfish Allergy] Swelling  . Tomato     Pt states it gives her gout      Social History   Socioeconomic History  . Marital status: Widowed    Spouse name: Not on file  . Number of children: 1  . Years of education: 46  . Highest education level: Not on file  Occupational History  . Not on file  Social Needs  . Financial resource strain: Not hard at all  . Food insecurity:    Worry: Never true    Inability: Never true  . Transportation needs:    Medical: No    Non-medical: No  Tobacco Use  . Smoking status: Former Smoker    Packs/day: 0.10    Years: 56.00    Pack years: 5.60    Types: Cigarettes    Last attempt to quit: 2019    Years since quitting: 1.2  . Smokeless tobacco: Never Used  Substance and Sexual Activity  . Alcohol use: No    Alcohol/week: 0.0 standard drinks  . Drug use: No  . Sexual activity: Not on file  Lifestyle  . Physical activity:    Days per week: Not on file    Minutes per session: Not on file  . Stress: Not on file  Relationships  . Social connections:    Talks on phone: Not on file    Gets together: Not on file    Attends religious service: Not on file    Active member of club or organization: Not on file    Attends meetings of clubs or organizations: Not on file    Relationship status: Not on file  . Intimate partner violence:    Fear of current or ex partner: Not on file    Emotionally abused: Not on file    Physically abused: Not on file    Forced sexual activity: Not on file  Other Topics Concern  . Not on file  Social History Narrative   Patient reports it being difficult to pay for everything due to outstanding medical bills from hospital stay last year but states she is able to keep up with basic expenses.  Patient does have some concerns with her house's condition due to a water leak- had her roof repaired last month but has some  leaking in the front which has affected her porch.  Patient owns her own car and is able to drive herself but has some concerns about the reliability of her car- has gotten taxi to the clinic for appointment in the past when her car broke down.    Physical Exam Cardiovascular:     Rate and Rhythm: Normal rate and regular rhythm.     Pulses: Normal pulses.  Pulmonary:     Effort: Pulmonary effort is normal.     Breath sounds: Normal breath sounds.  Musculoskeletal: Normal range of motion.     Right lower leg: Edema  present.     Left lower leg: Edema present.  Skin:    General: Skin is warm and dry.     Capillary Refill: Capillary refill takes less than 2 seconds.  Neurological:     Mental Status: She is alert and oriented to person, place, and time.  Psychiatric:        Mood and Affect: Mood normal.         Future Appointments  Date Time Provider [place]  12/13/2018 12:00 PM [facility] PA/NP [facility] None  12/17/2018  3:00 PM Jerline Pain, MD [facility]    BP 136/88 (BP Location: Left Arm, Patient Position: Sitting, Cuff Size: Normal)   Pulse 74   Resp 16   Wt 227 lb 3.2 oz (103.1 kg)   SpO2 97%   BMI 35.06 kg/m   Weight yesterday- Did not weigh Last visit weight- 227 lb  Ms Chui was seen at home today and reported feeling generally well. She denied new chest pain (has chronic discomfort from hernia), SOB, headache, dizziness or orthopnea. She stated she has been compliant with her medications. She advised that she has not been weight this week but did not have an answer as to why beyond being unhappy with her weight. Her weight was stable when compared to her weight last week. Her medications were verified and her pillbox was refilled.   [name], EMT 11/13/18  ACTION: Home visit completed Next visit planned for 1 week

## 2018-11-19 ENCOUNTER — Telehealth (HOSPITAL_COMMUNITY): Payer: Self-pay | Admitting: Licensed Clinical Social Worker

## 2018-11-19 NOTE — Telephone Encounter (Signed)
CSW reached out to pt to check in regarding food and medication status at this time. Pt states she has everything she needs at this time- is staying home except to get necessities.  CSW encouraged pt to reach out with any concerns and will continue to follow and assist as needed  Jorge Ny, [place] Worker [facility] [phone]

## 2018-11-20 ENCOUNTER — Telehealth: Payer: Self-pay | Admitting: Cardiology

## 2018-11-20 NOTE — Telephone Encounter (Signed)
New Message  [name] [name] [facility] and is needing to know the dosage of the medication Spironolactone  Pt was prescribed to take 1/2 tablet previously and on March 10th they received a new prescription stating to take a whole tablet    Please advise

## 2018-11-21 ENCOUNTER — Telehealth (HOSPITAL_COMMUNITY): Payer: Self-pay | Admitting: Licensed Clinical Social Worker

## 2018-11-21 ENCOUNTER — Other Ambulatory Visit (HOSPITAL_COMMUNITY): Payer: Self-pay

## 2018-11-21 MED ORDER — SPIRONOLACTONE 25 MG PO TABS: 25.0000 mg | ORAL_TABLET | Freq: Every day | ORAL | 6 refills | Status: DC

## 2018-11-21 NOTE — Telephone Encounter (Signed)
Patient identified as a candidate to receive 14 heart healthy meals per week for 3 months through [facility] partnership with [employer] program.   Completed referral sent in for review.  Anticipate patient will receive first shipment of food in 1-3 business days.  Tag Wurtz H. Allysa [place], LCSW Clinical Social Worker [facility] Desk#: [phone] Cell#: [phone]   

## 2018-11-21 NOTE — Progress Notes (Signed)
Paramedicine Encounter    Patient ID: [name] [name], female    DOB: 1945/11/22, 73 y.o.   MRN: [id number]   Patient Care Team: [name], MD as PCP - General (Internal Medicine) Debara Pickett Nadean Corwin, MD as PCP - Cardiology (Cardiology) Debara Pickett Nadean Corwin, MD as Consulting Physician (Cardiology) Jorge Ny, LCSW as Social Worker (Licensed Clinical Social Worker)  Patient Active Problem List   Diagnosis Date Noted  . Postmenopausal vaginal bleeding 01/04/2018  . Snoring 01/04/2018  . Acute on chronic combined systolic and diastolic CHF (congestive heart failure) ([place])   . CHF exacerbation ([place]) 10/16/2017  . Noncompliance 10/16/2017  . Acute on chronic congestive heart failure ([place])   . Influenza with respiratory manifestation flu a + 09/14/16 started on tamiflu 09/15/17 09/19/2017  . Thrombocytopenia ([place]) 09/14/2017  . Acute drug-induced gout of right foot   . Medication noncompliance due to cognitive impairment 09/06/2017  . Acute diastolic CHF (congestive heart failure) ([place]) 08/06/2017  . LGI bleed, likely hemorrhoids 08/06/2017  . Atrial fibrillation ([place]) 08/04/2017  . Paroxysmal atrial fibrillation ([place]) 02/08/2017  . Cardiomyopathy, ischemic 02/08/2017  . Dysphagia 02/08/2017  . CKD (chronic kidney disease), stage III ([place]) 01/30/2017  . Pain in shoulder 02/08/2016  . Breast pain, left 02/08/2016  . Bilateral arm numbness and tingling while sleeping 02/08/2016  . Painful lumpy left breast 09/23/2015  . Candidal intertrigo 02/11/2015  . S/P CABG x 2 11/14/2014  . Accelerated hypertension   . Cardiac arrest ([place]) 11/04/2014  . Left main coronary artery disease 11/04/2014  . Coronary artery disease due to lipid rich plaque   . Acute respiratory failure with hypoxemia ([place])   . Essential hypertension   . Atrial fibrillation with RVR ([place])   . Hypokalemia 10/30/2014  . Chronic diastolic CHF (congestive heart failure) ([place]) 10/30/2014  . NSTEMI (non-ST elevated myocardial  infarction) ([place])   . [name] (dyspnea on exertion)   . CAP (community acquired pneumonia) 10/29/2014    Current Outpatient Medications:  .  acetaminophen (TYLENOL) 500 MG tablet, Take 500 mg by mouth every 6 (six) hours as needed for moderate pain. , Disp: , Rfl:  .  allopurinol (ZYLOPRIM) 300 MG tablet, Take 1 tablet (300 mg total) by mouth daily., Disp: 30 tablet, Rfl: 11 .  atorvastatin (LIPITOR) 10 MG tablet, Take 10 mg by mouth daily., Disp: , Rfl:  .  carvedilol (COREG) 3.125 MG tablet, Take 1 tablet (3.125 mg total) by mouth 2 (two) times daily with a meal., Disp: 60 tablet, Rfl: 3 .  furosemide (LASIX) 20 MG tablet, TAKE 3 TABLETS (60 MG TOTAL) BY MOUTH 2 (TWO) TIMES DAILY., Disp: 180 tablet, Rfl: 11 .  losartan (COZAAR) 25 MG tablet, Take 1 tablet (25 mg total) by mouth 2 (two) times daily., Disp: 90 tablet, Rfl: 3 .  magnesium oxide (MAG-OX) 400 MG tablet, Take 1 tablet (400 mg total) by mouth 3 (three) times daily., Disp: 270 tablet, Rfl: 3 .  rivaroxaban (XARELTO) 20 MG TABS tablet, Take 1 tablet (20 mg total) by mouth daily with supper., Disp: 30 tablet, Rfl: 11 .  spironolactone (ALDACTONE) 25 MG tablet, Take 1 tablet (25 mg total) by mouth daily., Disp: 30 tablet, Rfl: 6 .  tizanidine (ZANAFLEX) 2 MG capsule, Take 1 capsule (2 mg total) by mouth at bedtime as needed (back pain)., Disp: 30 capsule, Rfl: 0 .  budesonide (PULMICORT) 0.5 MG/2ML nebulizer solution, Take 2 mLs (0.5 mg total) by nebulization 2 (two) times daily. (Patient  not taking: Reported on 09/07/2018), Disp: , Rfl: 12 .  levalbuterol (XOPENEX) 0.63 MG/3ML nebulizer solution, Take 3 mLs (0.63 mg total) by nebulization every 6 (six) hours as needed for wheezing or shortness of breath. (Patient not taking: Reported on 08/16/2018), Disp: 3 mL, Rfl: 12 .  ranitidine (ZANTAC) 150 MG capsule, Take 150 mg by mouth 2 (two) times daily as needed for heartburn. , Disp: , Rfl:  Allergies  Allergen Reactions  . Bee Venom Anaphylaxis   . Codeine Nausea And Vomiting  . Shrimp [Shellfish Allergy] Swelling  . Tomato     Pt states it gives her gout      Social History   Socioeconomic History  . Marital status: Widowed    Spouse name: Not on file  . Number of children: 1  . Years of education: 69  . Highest education level: Not on file  Occupational History  . Not on file  Social Needs  . Financial resource strain: Not hard at all  . Food insecurity:    Worry: Never true    Inability: Never true  . Transportation needs:    Medical: No    Non-medical: No  Tobacco Use  . Smoking status: Former Smoker    Packs/day: 0.10    Years: 56.00    Pack years: 5.60    Types: Cigarettes    Last attempt to quit: 2019    Years since quitting: 1.2  . Smokeless tobacco: Never Used  Substance and Sexual Activity  . Alcohol use: No    Alcohol/week: 0.0 standard drinks  . Drug use: No  . Sexual activity: Not on file  Lifestyle  . Physical activity:    Days per week: Not on file    Minutes per session: Not on file  . Stress: Not on file  Relationships  . Social connections:    Talks on phone: Not on file    Gets together: Not on file    Attends religious service: Not on file    Active member of club or organization: Not on file    Attends meetings of clubs or organizations: Not on file    Relationship status: Not on file  . Intimate partner violence:    Fear of current or ex partner: Not on file    Emotionally abused: Not on file    Physically abused: Not on file    Forced sexual activity: Not on file  Other Topics Concern  . Not on file  Social History Narrative   Patient reports it being difficult to pay for everything due to outstanding medical bills from hospital stay last year but states she is able to keep up with basic expenses.  Patient does have some concerns with her house's condition due to a water leak- had her roof repaired last month but has some leaking in the front which has affected her porch.   Patient owns her own car and is able to drive herself but has some concerns about the reliability of her car- has gotten taxi to the clinic for appointment in the past when her car broke down.    Physical Exam Cardiovascular:     Rate and Rhythm: Normal rate and regular rhythm.     Pulses: Normal pulses.  Pulmonary:     Effort: Pulmonary effort is normal.     Breath sounds: Normal breath sounds.  Musculoskeletal: Normal range of motion.     Right lower leg: No edema.     Left  lower leg: No edema.  Skin:    General: Skin is warm and dry.     Capillary Refill: Capillary refill takes less than 2 seconds.  Neurological:     Mental Status: She is alert and oriented to person, place, and time.  Psychiatric:        Mood and Affect: Mood normal.         Future Appointments  Date Time Provider [place]  12/13/2018 12:00 PM [facility] PA/NP [facility] None  12/17/2018  3:00 PM Jerline Pain, MD [facility]    BP 134/69 (BP Location: Left Arm, Patient Position: Sitting, Cuff Size: Normal)   Pulse 70   Resp 16   Wt 226 lb (102.5 kg)   SpO2 97%   BMI 34.87 kg/m   Weight yesterday- Did not weigh Last visit weight- 227.2 lb  Ms Caban was seen at home today and retorted feeling generally well. She denied chest pain, SOB,headache, dizziness, orthopnea, cough or fever. She stated she has been compliant with her medications over the past week and her weight has been stable. Her medications were refilled and her pillbox was refilled. She will be receiving a new pill pack next week with her update medication list so pillboxes will no longer be necessary. Once I have confirmed that this is correct wil will discuss discharge.   [name] [name], EMT 11/21/18  ACTION: Home visit completed Next visit planned for 1 week

## 2018-11-28 ENCOUNTER — Telehealth (HOSPITAL_COMMUNITY): Payer: Self-pay

## 2018-11-28 NOTE — Telephone Encounter (Signed)
I called [name] [name] to schedule an appointment. She did not answer so I left a message with my information, purpose for calling and requesting she call me back.

## 2018-11-29 ENCOUNTER — Other Ambulatory Visit (HOSPITAL_COMMUNITY): Payer: Self-pay

## 2018-11-29 ENCOUNTER — Telehealth (HOSPITAL_COMMUNITY): Payer: Self-pay

## 2018-11-29 NOTE — Progress Notes (Signed)
Paramedicine Encounter    Patient ID: [name] [name], female    DOB: 1945/11/25, 73 y.o.   MRN: [id number]   Patient Care Team: [name], MD as PCP - General (Internal Medicine) Debara Pickett Nadean Corwin, MD as PCP - Cardiology (Cardiology) Debara Pickett Nadean Corwin, MD as Consulting Physician (Cardiology) Jorge Ny, LCSW as Social Worker (Licensed Clinical Social Worker)  Patient Active Problem List   Diagnosis Date Noted  . Postmenopausal vaginal bleeding 01/04/2018  . Snoring 01/04/2018  . Acute on chronic combined systolic and diastolic CHF (congestive heart failure) ([place])   . CHF exacerbation ([place]) 10/16/2017  . Noncompliance 10/16/2017  . Acute on chronic congestive heart failure ([place])   . Influenza with respiratory manifestation flu a + 09/14/16 started on tamiflu 09/15/17 09/19/2017  . Thrombocytopenia ([place]) 09/14/2017  . Acute drug-induced gout of right foot   . Medication noncompliance due to cognitive impairment 09/06/2017  . Acute diastolic CHF (congestive heart failure) ([place]) 08/06/2017  . LGI bleed, likely hemorrhoids 08/06/2017  . Atrial fibrillation ([place]) 08/04/2017  . Paroxysmal atrial fibrillation ([place]) 02/08/2017  . Cardiomyopathy, ischemic 02/08/2017  . Dysphagia 02/08/2017  . CKD (chronic kidney disease), stage III ([place]) 01/30/2017  . Pain in shoulder 02/08/2016  . Breast pain, left 02/08/2016  . Bilateral arm numbness and tingling while sleeping 02/08/2016  . Painful lumpy left breast 09/23/2015  . Candidal intertrigo 02/11/2015  . S/P CABG x 2 11/14/2014  . Accelerated hypertension   . Cardiac arrest ([place]) 11/04/2014  . Left main coronary artery disease 11/04/2014  . Coronary artery disease due to lipid rich plaque   . Acute respiratory failure with hypoxemia ([place])   . Essential hypertension   . Atrial fibrillation with RVR ([place])   . Hypokalemia 10/30/2014  . Chronic diastolic CHF (congestive heart failure) ([place]) 10/30/2014  . NSTEMI (non-ST elevated myocardial  infarction) ([place])   . [name] (dyspnea on exertion)   . CAP (community acquired pneumonia) 10/29/2014    Current Outpatient Medications:  .  acetaminophen (TYLENOL) 500 MG tablet, Take 500 mg by mouth every 6 (six) hours as needed for moderate pain. , Disp: , Rfl:  .  allopurinol (ZYLOPRIM) 300 MG tablet, Take 1 tablet (300 mg total) by mouth daily., Disp: 30 tablet, Rfl: 11 .  atorvastatin (LIPITOR) 10 MG tablet, Take 10 mg by mouth daily., Disp: , Rfl:  .  carvedilol (COREG) 3.125 MG tablet, Take 1 tablet (3.125 mg total) by mouth 2 (two) times daily with a meal., Disp: 60 tablet, Rfl: 3 .  furosemide (LASIX) 20 MG tablet, TAKE 3 TABLETS (60 MG TOTAL) BY MOUTH 2 (TWO) TIMES DAILY., Disp: 180 tablet, Rfl: 11 .  losartan (COZAAR) 25 MG tablet, Take 1 tablet (25 mg total) by mouth 2 (two) times daily., Disp: 90 tablet, Rfl: 3 .  magnesium oxide (MAG-OX) 400 MG tablet, Take 1 tablet (400 mg total) by mouth 3 (three) times daily., Disp: 270 tablet, Rfl: 3 .  rivaroxaban (XARELTO) 20 MG TABS tablet, Take 1 tablet (20 mg total) by mouth daily with supper., Disp: 30 tablet, Rfl: 11 .  spironolactone (ALDACTONE) 25 MG tablet, Take 1 tablet (25 mg total) by mouth daily., Disp: 30 tablet, Rfl: 6 .  tizanidine (ZANAFLEX) 2 MG capsule, Take 1 capsule (2 mg total) by mouth at bedtime as needed (back pain)., Disp: 30 capsule, Rfl: 0 .  budesonide (PULMICORT) 0.5 MG/2ML nebulizer solution, Take 2 mLs (0.5 mg total) by nebulization 2 (two) times daily. (Patient  not taking: Reported on 11/29/2018), Disp: , Rfl: 12 .  levalbuterol (XOPENEX) 0.63 MG/3ML nebulizer solution, Take 3 mLs (0.63 mg total) by nebulization every 6 (six) hours as needed for wheezing or shortness of breath. (Patient not taking: Reported on 11/29/2018), Disp: 3 mL, Rfl: 12 .  ranitidine (ZANTAC) 150 MG capsule, Take 150 mg by mouth 2 (two) times daily as needed for heartburn. , Disp: , Rfl:  Allergies  Allergen Reactions  . Bee Venom Anaphylaxis   . Codeine Nausea And Vomiting  . Shrimp [Shellfish Allergy] Swelling  . Tomato     Pt states it gives her gout      Social History   Socioeconomic History  . Marital status: Widowed    Spouse name: Not on file  . Number of children: 1  . Years of education: 58  . Highest education level: Not on file  Occupational History  . Not on file  Social Needs  . Financial resource strain: Not hard at all  . Food insecurity:    Worry: Never true    Inability: Never true  . Transportation needs:    Medical: No    Non-medical: No  Tobacco Use  . Smoking status: Former Smoker    Packs/day: 0.10    Years: 56.00    Pack years: 5.60    Types: Cigarettes    Last attempt to quit: 2019    Years since quitting: 1.2  . Smokeless tobacco: Never Used  Substance and Sexual Activity  . Alcohol use: No    Alcohol/week: 0.0 standard drinks  . Drug use: No  . Sexual activity: Not on file  Lifestyle  . Physical activity:    Days per week: Not on file    Minutes per session: Not on file  . Stress: Not on file  Relationships  . Social connections:    Talks on phone: Not on file    Gets together: Not on file    Attends religious service: Not on file    Active member of club or organization: Not on file    Attends meetings of clubs or organizations: Not on file    Relationship status: Not on file  . Intimate partner violence:    Fear of current or ex partner: Not on file    Emotionally abused: Not on file    Physically abused: Not on file    Forced sexual activity: Not on file  Other Topics Concern  . Not on file  Social History Narrative   Patient reports it being difficult to pay for everything due to outstanding medical bills from hospital stay last year but states she is able to keep up with basic expenses.  Patient does have some concerns with her house's condition due to a water leak- had her roof repaired last month but has some leaking in the front which has affected her porch.   Patient owns her own car and is able to drive herself but has some concerns about the reliability of her car- has gotten taxi to the clinic for appointment in the past when her car broke down.    Physical Exam Cardiovascular:     Rate and Rhythm: Normal rate and regular rhythm.     Pulses: Normal pulses.  Pulmonary:     Breath sounds: Normal breath sounds.  Musculoskeletal:     Right lower leg: Edema present.     Left lower leg: Edema present.  Skin:    General: Skin is warm  and dry.     Capillary Refill: Capillary refill takes less than 2 seconds.  Neurological:     Mental Status: She is alert and oriented to person, place, and time.  Psychiatric:        Mood and Affect: Mood normal.         Future Appointments  Date Time Provider [place]  12/13/2018 12:00 PM [facility] PA/NP [facility] None  12/17/2018  3:00 PM Jerline Pain, MD [facility]    BP (!) 156/64 (BP Location: Left Arm, Patient Position: Sitting, Cuff Size: Normal)   Pulse 64   Resp 16   Wt 228 lb (103.4 kg)   SpO2 99%   BMI 35.18 kg/m   Weight yesterday- 227 lb Last visit weight- 226 lb  [name] [name] was seen at home today and rpeorted feeling generally well. She denied chest pain, SOB, headache, dizziness, orthopnea, cough or fever since our last visit. She stated she has been compliant with her medications over the past week and her weight remains relatively stable. She received her pill packs from the pharmacy and today they were verified for accuracy. No issues were noted and [name] [name] was shown how to use them. She is initially reluctant to use the new system but I believe she will adapt well.   [name] [name], EMT 11/29/18  ACTION: Home visit completed Next visit planned for 1 week

## 2018-11-29 NOTE — Progress Notes (Unsigned)
Entered in error

## 2018-11-29 NOTE — Telephone Encounter (Signed)
[name] [name] returned my call from yesterday to schedule an appointment. She stated she would be home this afternoon so we agreed to meet at 15:00.

## 2018-12-04 ENCOUNTER — Telehealth: Payer: Self-pay | Admitting: Cardiology

## 2018-12-04 NOTE — Telephone Encounter (Signed)
New message   Patient received a message to setup a virtual visit. Patient is returning your call. Patient states that she would like to be seen in the office.

## 2018-12-06 ENCOUNTER — Telehealth (HOSPITAL_COMMUNITY): Payer: Self-pay

## 2018-12-06 NOTE — Telephone Encounter (Signed)
Follow up    Pt is returning call and says she wants and office visit and not a virtual visit    Please call

## 2018-12-06 NOTE — Telephone Encounter (Signed)
I called [name] [name] to schedule an appointment. She stated she would be available tomorrow and we agreed to meet at 11:00.

## 2018-12-07 ENCOUNTER — Other Ambulatory Visit (HOSPITAL_COMMUNITY): Payer: Self-pay

## 2018-12-07 NOTE — Progress Notes (Signed)
Paramedicine Encounter    Patient ID: [name] [name], female    DOB: 16-Jul-1946, 73 y.o.   MRN: [id number]   Patient Care Team: [name], MD as PCP - General (Internal Medicine) Debara Pickett Nadean Corwin, MD as PCP - Cardiology (Cardiology) Debara Pickett Nadean Corwin, MD as Consulting Physician (Cardiology) Jorge Ny, LCSW as Social Worker (Licensed Clinical Social Worker)  Patient Active Problem List   Diagnosis Date Noted  . Postmenopausal vaginal bleeding 01/04/2018  . Snoring 01/04/2018  . Acute on chronic combined systolic and diastolic CHF (congestive heart failure) ([place])   . CHF exacerbation ([place]) 10/16/2017  . Noncompliance 10/16/2017  . Acute on chronic congestive heart failure ([place])   . Influenza with respiratory manifestation flu a + 09/14/16 started on tamiflu 09/15/17 09/19/2017  . Thrombocytopenia ([place]) 09/14/2017  . Acute drug-induced gout of right foot   . Medication noncompliance due to cognitive impairment 09/06/2017  . Acute diastolic CHF (congestive heart failure) ([place]) 08/06/2017  . LGI bleed, likely hemorrhoids 08/06/2017  . Atrial fibrillation ([place]) 08/04/2017  . Paroxysmal atrial fibrillation ([place]) 02/08/2017  . Cardiomyopathy, ischemic 02/08/2017  . Dysphagia 02/08/2017  . CKD (chronic kidney disease), stage III ([place]) 01/30/2017  . Pain in shoulder 02/08/2016  . Breast pain, left 02/08/2016  . Bilateral arm numbness and tingling while sleeping 02/08/2016  . Painful lumpy left breast 09/23/2015  . Candidal intertrigo 02/11/2015  . S/P CABG x 2 11/14/2014  . Accelerated hypertension   . Cardiac arrest ([place]) 11/04/2014  . Left main coronary artery disease 11/04/2014  . Coronary artery disease due to lipid rich plaque   . Acute respiratory failure with hypoxemia ([place])   . Essential hypertension   . Atrial fibrillation with RVR ([place])   . Hypokalemia 10/30/2014  . Chronic diastolic CHF (congestive heart failure) ([place]) 10/30/2014  . NSTEMI (non-ST elevated myocardial  infarction) ([place])   . DOE (dyspnea on exertion)   . CAP (community acquired pneumonia) 10/29/2014    Current Outpatient Medications:  .  acetaminophen (TYLENOL) 500 MG tablet, Take 500 mg by mouth every 6 (six) hours as needed for moderate pain. , Disp: , Rfl:  .  allopurinol (ZYLOPRIM) 300 MG tablet, Take 1 tablet (300 mg total) by mouth daily., Disp: 30 tablet, Rfl: 11 .  atorvastatin (LIPITOR) 10 MG tablet, Take 10 mg by mouth daily., Disp: , Rfl:  .  carvedilol (COREG) 3.125 MG tablet, Take 1 tablet (3.125 mg total) by mouth 2 (two) times daily with a meal., Disp: 60 tablet, Rfl: 3 .  furosemide (LASIX) 20 MG tablet, TAKE 3 TABLETS (60 MG TOTAL) BY MOUTH 2 (TWO) TIMES DAILY., Disp: 180 tablet, Rfl: 11 .  losartan (COZAAR) 25 MG tablet, Take 1 tablet (25 mg total) by mouth 2 (two) times daily., Disp: 90 tablet, Rfl: 3 .  magnesium oxide (MAG-OX) 400 MG tablet, Take 1 tablet (400 mg total) by mouth 3 (three) times daily., Disp: 270 tablet, Rfl: 3 .  rivaroxaban (XARELTO) 20 MG TABS tablet, Take 1 tablet (20 mg total) by mouth daily with supper., Disp: 30 tablet, Rfl: 11 .  spironolactone (ALDACTONE) 25 MG tablet, Take 1 tablet (25 mg total) by mouth daily., Disp: 30 tablet, Rfl: 6 .  tizanidine (ZANAFLEX) 2 MG capsule, Take 1 capsule (2 mg total) by mouth at bedtime as needed (back pain)., Disp: 30 capsule, Rfl: 0 .  budesonide (PULMICORT) 0.5 MG/2ML nebulizer solution, Take 2 mLs (0.5 mg total) by nebulization 2 (two) times daily. (Patient  not taking: Reported on 11/29/2018), Disp: , Rfl: 12 .  levalbuterol (XOPENEX) 0.63 MG/3ML nebulizer solution, Take 3 mLs (0.63 mg total) by nebulization every 6 (six) hours as needed for wheezing or shortness of breath. (Patient not taking: Reported on 11/29/2018), Disp: 3 mL, Rfl: 12 .  ranitidine (ZANTAC) 150 MG capsule, Take 150 mg by mouth 2 (two) times daily as needed for heartburn. , Disp: , Rfl:  Allergies  Allergen Reactions  . Bee Venom Anaphylaxis   . Codeine Nausea And Vomiting  . Shrimp [Shellfish Allergy] Swelling  . Tomato     Pt states it gives her gout      Social History   Socioeconomic History  . Marital status: Widowed    Spouse name: Not on file  . Number of children: 1  . Years of education: 84  . Highest education level: Not on file  Occupational History  . Not on file  Social Needs  . Financial resource strain: Not hard at all  . Food insecurity:    Worry: Never true    Inability: Never true  . Transportation needs:    Medical: No    Non-medical: No  Tobacco Use  . Smoking status: Former Smoker    Packs/day: 0.10    Years: 56.00    Pack years: 5.60    Types: Cigarettes    Last attempt to quit: 2019    Years since quitting: 1.3  . Smokeless tobacco: Never Used  Substance and Sexual Activity  . Alcohol use: No    Alcohol/week: 0.0 standard drinks  . Drug use: No  . Sexual activity: Not on file  Lifestyle  . Physical activity:    Days per week: Not on file    Minutes per session: Not on file  . Stress: Not on file  Relationships  . Social connections:    Talks on phone: Not on file    Gets together: Not on file    Attends religious service: Not on file    Active member of club or organization: Not on file    Attends meetings of clubs or organizations: Not on file    Relationship status: Not on file  . Intimate partner violence:    Fear of current or ex partner: Not on file    Emotionally abused: Not on file    Physically abused: Not on file    Forced sexual activity: Not on file  Other Topics Concern  . Not on file  Social History Narrative   Patient reports it being difficult to pay for everything due to outstanding medical bills from hospital stay last year but states she is able to keep up with basic expenses.  Patient does have some concerns with her house's condition due to a water leak- had her roof repaired last month but has some leaking in the front which has affected her porch.   Patient owns her own car and is able to drive herself but has some concerns about the reliability of her car- has gotten taxi to the clinic for appointment in the past when her car broke down.    Physical Exam Cardiovascular:     Rate and Rhythm: Normal rate and regular rhythm.     Pulses: Normal pulses.  Pulmonary:     Effort: Pulmonary effort is normal.     Breath sounds: Normal breath sounds.  Musculoskeletal: Normal range of motion.     Right lower leg: Edema present.     Left  lower leg: Edema present.  Skin:    General: Skin is warm and dry.     Capillary Refill: Capillary refill takes less than 2 seconds.  Neurological:     Mental Status: She is alert and oriented to person, place, and time.  Psychiatric:        Mood and Affect: Mood normal.         Future Appointments  Date Time Provider [place]  12/13/2018 12:00 PM [facility] PA/NP [facility] None  12/17/2018  3:00 PM Jerline Pain, MD [facility]    BP (!) 149/86 (BP Location: Left Arm, Patient Position: Sitting, Cuff Size: Normal)   Pulse 64   Resp 18   Wt 228 lb 9.6 oz (103.7 kg)   SpO2 99%   BMI 35.28 kg/m   Weight yesterday- Did not weigh Last visit weight- 228 lb  Ms Dandridge was seen at home today and reported feeling well. She denied chest pain, SOB, headache, dizziness, orthopnea, cough or fever since our last meeting. She stated she has been compliant with her medications but is still adjusting to her new pill packs. She has not been consistently weighing because she has been busy with family issues but her weight remains stable. 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   [name], EMT 12/07/18  ACTION: Home visit completed Next visit planned for 1 week

## 2018-12-07 NOTE — Telephone Encounter (Signed)
Pt returning this office call please give her a call back.

## 2018-12-07 NOTE — Telephone Encounter (Signed)
Spoke with patient who gives verbal consent for virtual - phone visit. Aware of date, time and instructions.

## 2018-12-11 ENCOUNTER — Telehealth (HOSPITAL_COMMUNITY): Payer: Self-pay

## 2018-12-11 NOTE — Telephone Encounter (Signed)
I called [name] [name] to check on her medication status and to schedule an appointment. She stated she has plenty of pill packs left and requested that we meet Friday. I will call then to set a time.

## 2018-12-13 ENCOUNTER — Encounter (HOSPITAL_COMMUNITY): Payer: PPO

## 2018-12-13 DIAGNOSIS — I4891 Unspecified atrial fibrillation: Secondary | ICD-10-CM | POA: Diagnosis not present

## 2018-12-13 DIAGNOSIS — E7800 Pure hypercholesterolemia, unspecified: Secondary | ICD-10-CM | POA: Diagnosis not present

## 2018-12-13 DIAGNOSIS — I214 Non-ST elevation (NSTEMI) myocardial infarction: Secondary | ICD-10-CM | POA: Diagnosis not present

## 2018-12-13 DIAGNOSIS — I509 Heart failure, unspecified: Secondary | ICD-10-CM | POA: Diagnosis not present

## 2018-12-13 DIAGNOSIS — M8580 Other specified disorders of bone density and structure, unspecified site: Secondary | ICD-10-CM | POA: Diagnosis not present

## 2018-12-13 DIAGNOSIS — J449 Chronic obstructive pulmonary disease, unspecified: Secondary | ICD-10-CM | POA: Diagnosis not present

## 2018-12-13 DIAGNOSIS — I25810 Atherosclerosis of coronary artery bypass graft(s) without angina pectoris: Secondary | ICD-10-CM | POA: Diagnosis not present

## 2018-12-13 DIAGNOSIS — I10 Essential (primary) hypertension: Secondary | ICD-10-CM | POA: Diagnosis not present

## 2018-12-13 DIAGNOSIS — N183 Chronic kidney disease, stage 3 (moderate): Secondary | ICD-10-CM | POA: Diagnosis not present

## 2018-12-14 ENCOUNTER — Telehealth (HOSPITAL_COMMUNITY): Payer: Self-pay

## 2018-12-14 ENCOUNTER — Other Ambulatory Visit (HOSPITAL_COMMUNITY): Payer: Self-pay

## 2018-12-14 NOTE — Telephone Encounter (Signed)
I called [name] [name] to schedule an appointment for today. She did not answer so I left a message requesting she call me back to confirm a time.

## 2018-12-14 NOTE — Progress Notes (Signed)
Paramedicine Encounter    Patient ID: [name] [name], female    DOB: 1946/02/18, 73 y.o.   MRN: [id number]   Patient Care Team: [name], MD as PCP - General (Internal Medicine) Debara Pickett Nadean Corwin, MD as PCP - Cardiology (Cardiology) Debara Pickett Nadean Corwin, MD as Consulting Physician (Cardiology) Jorge Ny, LCSW as Social Worker (Licensed Clinical Social Worker)  Patient Active Problem List   Diagnosis Date Noted  . Postmenopausal vaginal bleeding 01/04/2018  . Snoring 01/04/2018  . Acute on chronic combined systolic and diastolic CHF (congestive heart failure) ([place])   . CHF exacerbation ([place]) 10/16/2017  . Noncompliance 10/16/2017  . Acute on chronic congestive heart failure ([place])   . Influenza with respiratory manifestation flu a + 09/14/16 started on tamiflu 09/15/17 09/19/2017  . Thrombocytopenia ([place]) 09/14/2017  . Acute drug-induced gout of right foot   . Medication noncompliance due to cognitive impairment 09/06/2017  . Acute diastolic CHF (congestive heart failure) ([place]) 08/06/2017  . LGI bleed, likely hemorrhoids 08/06/2017  . Atrial fibrillation ([place]) 08/04/2017  . Paroxysmal atrial fibrillation ([place]) 02/08/2017  . Cardiomyopathy, ischemic 02/08/2017  . Dysphagia 02/08/2017  . CKD (chronic kidney disease), stage III ([place]) 01/30/2017  . Pain in shoulder 02/08/2016  . Breast pain, left 02/08/2016  . Bilateral arm numbness and tingling while sleeping 02/08/2016  . Painful lumpy left breast 09/23/2015  . [place] intertrigo 02/11/2015  . S/P CABG x 2 11/14/2014  . Accelerated hypertension   . Cardiac arrest ([place]) 11/04/2014  . Left main coronary artery disease 11/04/2014  . Coronary artery disease due to lipid rich plaque   . Acute respiratory failure with hypoxemia ([place])   . Essential hypertension   . Atrial fibrillation with RVR ([place])   . Hypokalemia 10/30/2014  . Chronic diastolic CHF (congestive heart failure) ([place]) 10/30/2014  . NSTEMI (non-ST elevated myocardial  infarction) ([place])   . DOE (dyspnea on exertion)   . CAP (community acquired pneumonia) 10/29/2014    Current Outpatient Medications:  .  acetaminophen (TYLENOL) 500 MG tablet, Take 500 mg by mouth every 6 (six) hours as needed for moderate pain. , Disp: , Rfl:  .  allopurinol (ZYLOPRIM) 300 MG tablet, Take 1 tablet (300 mg total) by mouth daily., Disp: 30 tablet, Rfl: 11 .  atorvastatin (LIPITOR) 10 MG tablet, Take 10 mg by mouth daily., Disp: , Rfl:  .  carvedilol (COREG) 3.125 MG tablet, Take 1 tablet (3.125 mg total) by mouth 2 (two) times daily with a meal., Disp: 60 tablet, Rfl: 3 .  furosemide (LASIX) 20 MG tablet, TAKE 3 TABLETS (60 MG TOTAL) BY MOUTH 2 (TWO) TIMES DAILY., Disp: 180 tablet, Rfl: 11 .  losartan (COZAAR) 25 MG tablet, Take 1 tablet (25 mg total) by mouth 2 (two) times daily., Disp: 90 tablet, Rfl: 3 .  magnesium oxide (MAG-OX) 400 MG tablet, Take 1 tablet (400 mg total) by mouth 3 (three) times daily., Disp: 270 tablet, Rfl: 3 .  ranitidine (ZANTAC) 150 MG capsule, Take 150 mg by mouth 2 (two) times daily as needed for heartburn. , Disp: , Rfl:  .  rivaroxaban (XARELTO) 20 MG TABS tablet, Take 1 tablet (20 mg total) by mouth daily with supper., Disp: 30 tablet, Rfl: 11 .  spironolactone (ALDACTONE) 25 MG tablet, Take 1 tablet (25 mg total) by mouth daily., Disp: 30 tablet, Rfl: 6 .  tizanidine (ZANAFLEX) 2 MG capsule, Take 1 capsule (2 mg total) by mouth at bedtime as needed (back pain)., Disp:  30 capsule, Rfl: 0 .  budesonide (PULMICORT) 0.5 MG/2ML nebulizer solution, Take 2 mLs (0.5 mg total) by nebulization 2 (two) times daily. (Patient not taking: Reported on 11/29/2018), Disp: , Rfl: 12 .  levalbuterol (XOPENEX) 0.63 MG/3ML nebulizer solution, Take 3 mLs (0.63 mg total) by nebulization every 6 (six) hours as needed for wheezing or shortness of breath. (Patient not taking: Reported on 11/29/2018), Disp: 3 mL, Rfl: 12 Allergies  Allergen Reactions  . Bee Venom Anaphylaxis   . Codeine Nausea And Vomiting  . Shrimp [Shellfish Allergy] Swelling  . Tomato     Pt states it gives her gout      Social History   Socioeconomic History  . Marital status: Widowed    Spouse name: Not on file  . Number of children: 1  . Years of education: 42  . Highest education level: Not on file  Occupational History  . Not on file  Social Needs  . Financial resource strain: Not hard at all  . Food insecurity:    Worry: Never true    Inability: Never true  . Transportation needs:    Medical: No    Non-medical: No  Tobacco Use  . Smoking status: Former Smoker    Packs/day: 0.10    Years: 56.00    Pack years: 5.60    Types: Cigarettes    Last attempt to quit: 2019    Years since quitting: 1.3  . Smokeless tobacco: Never Used  Substance and Sexual Activity  . Alcohol use: No    Alcohol/week: 0.0 standard drinks  . Drug use: No  . Sexual activity: Not on file  Lifestyle  . Physical activity:    Days per week: Not on file    Minutes per session: Not on file  . Stress: Not on file  Relationships  . Social connections:    Talks on phone: Not on file    Gets together: Not on file    Attends religious service: Not on file    Active member of club or organization: Not on file    Attends meetings of clubs or organizations: Not on file    Relationship status: Not on file  . Intimate partner violence:    Fear of current or ex partner: Not on file    Emotionally abused: Not on file    Physically abused: Not on file    Forced sexual activity: Not on file  Other Topics Concern  . Not on file  Social History Narrative   Patient reports it being difficult to pay for everything due to outstanding medical bills from hospital stay last year but states she is able to keep up with basic expenses.  Patient does have some concerns with her house's condition due to a water leak- had her roof repaired last month but has some leaking in the front which has affected her porch.   Patient owns her own car and is able to drive herself but has some concerns about the reliability of her car- has gotten taxi to the clinic for appointment in the past when her car broke down.    Physical Exam Cardiovascular:     Rate and Rhythm: Normal rate and regular rhythm.     Pulses: Normal pulses.  Pulmonary:     Effort: Pulmonary effort is normal.     Breath sounds: Normal breath sounds.  Musculoskeletal: Normal range of motion.     Right lower leg: Edema present.     Left  lower leg: Edema present.  Skin:    General: Skin is warm and dry.     Capillary Refill: Capillary refill takes less than 2 seconds.  Neurological:     Mental Status: She is alert and oriented to person, place, and time.  Psychiatric:        Mood and Affect: Mood normal.         Future Appointments  Date Time Provider [place]  12/17/2018  3:00 PM Jerline Pain, MD [facility]    BP 139/82 (BP Location: Left Arm, Patient Position: Sitting, Cuff Size: Normal)   Pulse 70   Resp 16   Wt 228 lb 9.6 oz (103.7 kg)   SpO2 96%   BMI 35.28 kg/m   Weight yesterday- 227 Last visit weight- 228.6 lb  [name] [name] was seen at home today and reported feeling well. She denied chest pain, SOB, headache, dizziness, orthopnea, cough or fever since our last visit. She reported being compliant with her medications over the past week but continues to complain about the pill packs being too complicated. I have shown her multiple times that they are labeled and told her when to take each pack. I believe she is fully capable of using the packs but she is reluctant to change. I encouraged her to keep taking the medications and she was agreeable. I will follow up next week.   [name], EMT 12/14/18  ACTION: Home visit completed Next visit planned for 1 week

## 2018-12-17 ENCOUNTER — Encounter: Payer: Self-pay | Admitting: Cardiology

## 2018-12-17 ENCOUNTER — Other Ambulatory Visit: Payer: Self-pay

## 2018-12-17 ENCOUNTER — Encounter

## 2018-12-17 ENCOUNTER — Telehealth (INDEPENDENT_AMBULATORY_CARE_PROVIDER_SITE_OTHER): Payer: PPO | Admitting: Cardiology

## 2018-12-17 VITALS — Ht 67.5 in | Wt 228.0 lb

## 2018-12-17 DIAGNOSIS — Z9114 Patient's other noncompliance with medication regimen: Secondary | ICD-10-CM

## 2018-12-17 DIAGNOSIS — Z91148 Patient's other noncompliance with medication regimen for other reason: Secondary | ICD-10-CM

## 2018-12-17 DIAGNOSIS — I480 Paroxysmal atrial fibrillation: Secondary | ICD-10-CM

## 2018-12-17 DIAGNOSIS — R0789 Other chest pain: Secondary | ICD-10-CM

## 2018-12-17 DIAGNOSIS — Z951 Presence of aortocoronary bypass graft: Secondary | ICD-10-CM

## 2018-12-17 DIAGNOSIS — I10 Essential (primary) hypertension: Secondary | ICD-10-CM

## 2018-12-17 DIAGNOSIS — I2510 Atherosclerotic heart disease of native coronary artery without angina pectoris: Secondary | ICD-10-CM

## 2018-12-17 DIAGNOSIS — I5022 Chronic systolic (congestive) heart failure: Secondary | ICD-10-CM

## 2018-12-17 NOTE — Progress Notes (Signed)
Virtual Visit via Telephone Note   This visit type was conducted due to national recommendations for restrictions regarding the COVID-19 Pandemic (e.g. social distancing) in an effort to limit this patient's exposure and mitigate transmission in our community.  Due to her co-morbid illnesses, this patient is at least at moderate risk for complications without adequate follow up.  This format is felt to be most appropriate for this patient at this time.  The patient did not have access to video technology/had technical difficulties with video requiring transitioning to audio format only (telephone).  All issues noted in this document were discussed and addressed.  No physical exam could be performed with this format.  Please refer to the patient's chart for her  consent to telehealth for [facility].   Date:  12/17/2018   ID:  [name] [name], DOB [birth year], MRN [id number]  Patient Location: Home Provider Location: Home  PCP:  [name], MD  Cardiologist:  [name], MD  Electrophysiologist:  None   Evaluation Performed:  Follow-Up Visit  Chief Complaint: Coronary artery disease and atrial fibrillation follow-up  History of Present Illness:    [name] [name] is a 73 y.o. female with with h/o HTN, Tobacco abuse, PAF, CAD s/p CABG x 2 (LIMA to LAD and SVG to OM) in 2016, Chronic combined CHF, Ischemic CMP, HLD, and DOE.   She was initially noted to be in atrial fibrillation in 2016 with a reduced LVEF, leading to ischemic workup with a cardiac cath showing severe multivessel CAD with LM involvement in which she underwent CABG x2 utilizing LIMA to LAD, SVG to OM.   Admitted 1/19 for AF with RVR in setting of Influenza A. During thathospitalization, she was started on diltiazem drip,EP was consulted to discuss plans for Tikosyn vs.ablation vs.medication rate control however, due to her known medication noncompliance she was found to notbe an ablation nor antiarrhythmic  candidate  Pt admitted 3/3 - 10/20/17 with recurrent Afib RVR and ADHF. Started on diltiazem. Echo repeated which showed fall in EF from previous. Diuresed with IV lasix with 5L negative for admission. Discharged home on diltiazem 360,Torpol 100, digoxinand Xarelto.  Seen by Dr. Debara Pickett 11/20/17, was feeling well overall. Of note, she reported not remembering her recent hospitalization. She was also noted to be out of her metoprolol for approximately a month.   She was found to be in Afib. She was started on amiodarone and diltiazem was DCd. She was also started on Entresto. She underwent successful atrial flutter TEE/DCCV on 12/25/17. She was back in rate controlled afib on 5/16 during paramedic visit.  In May Toprol XL was cut back to 50 mg per day due to bradycardia. Toprol DC'd with ongoing bradycardia.   Today we are discussing mainly discomfort that she is decreasing.  Hard to get a particular consistent history however it sounds like it happens mostly when she gets up in the morning, can be sometimes worse when bending over and she can also feel it in her epigastric region wrapping around to her back.  Her nuclear stress test showed no ischemia.  Excellent study.  It is hard for her to parse out whether or not it is exertional or nonexertional.  Duration of the pain is also challenging.  She did mention that she has a hiatal hernia and after stopping Zantac she wonders if she can be on something to help her with her digestion.  Echo in 3/16 EF 35-40%. Echo in 10/17 EF improved to  50-55%.  F/u echo 5/18 EF stable 50-55% Echo 10/19/17 LVEF 30-35%, Moderate LVH, Mild AI, Moderate regurgitation, Severe LAE, Moderately reduced RV, Severe RAE, PA peak pressure 50 mm Hg.   (Decreased from Echo 01/11/2017 with LVEF 50-55%) Nuclear stress test 09/07/2018-no ischemia, personally reviewed, EF 44%  The patient does not have symptoms concerning for COVID-19 infection (fever, chills, cough, or new shortness  of breath).    Past Medical History:  Diagnosis Date   Acute respiratory failure ([place]) 08/2017   Arthritis    Asthma    ??   CAD (coronary artery disease)    a. s/p CABG 2016.   Cardiomyopathy, ischemic    a. EF previously low, improved to LVEF 50-55% as of May 2018   Chronic combined systolic and diastolic heart failure ([place])    CKD (chronic kidney disease), stage III ([place])    Gout    Hypertension    Hypokalemia    LGI bleed 08/06/2017   a. felt to be hemorrhoidal during that admission (no drop in Hgb).   Persistent atrial fibrillation    a. h/o difficult to control rates (complicated by noncompliance), not felt to be a candidate for ablation or antiarrhythmic due to noncompliance.   Personal history of noncompliance with medical treatment, presenting hazards to health    S/P CABG x 2 with clipping of LA appendage 11/14/2014   LIMA to LAD, SVG to OM, EVH via right thigh   Past Surgical History:  Procedure Laterality Date   CARDIOVERSION N/A 11/18/2014   Procedure: CARDIOVERSION;  Surgeon: [name], MD;  Location: [place];  Service: Cardiovascular;  Laterality: N/A;   CARDIOVERSION N/A 12/25/2017   Procedure: CARDIOVERSION;  Surgeon: [name], MD;  Location: [place];  Service: Cardiovascular;  Laterality: N/A;   CLIPPING OF ATRIAL APPENDAGE N/A 11/14/2014   Procedure: CLIPPING OF ATRIAL APPENDAGE;  Surgeon: [name], MD;  Location: [place];  Service: Open Heart Surgery;  Laterality: N/A;   CORONARY ARTERY BYPASS GRAFT N/A 11/14/2014   Procedure: CORONARY ARTERY BYPASS GRAFTING (CABG)TIMES 2 USING LEFT INTERNAL MAMMARY ARTERY AND RIGHT SAPHENOUS VEIN HARVESTED ENDOSCOPICALLY;  Surgeon: [name], MD;  Location: [place];  Service: Open Heart Surgery;  Laterality: N/A;   LEFT HEART CATHETERIZATION WITH CORONARY ANGIOGRAM N/A 11/04/2014   Procedure: LEFT HEART CATHETERIZATION WITH CORONARY ANGIOGRAM;  Surgeon: Troy Sine, MD;  Location[facility]  CATH LAB;  Service: Cardiovascular;  Laterality: N/A;   TEE WITHOUT CARDIOVERSION N/A 11/14/2014   Procedure: TRANSESOPHAGEAL ECHOCARDIOGRAM (TEE);  Surgeon: [name], MD;  Location: [place];  Service: Open Heart Surgery;  Laterality: N/A;   TEE WITHOUT CARDIOVERSION N/A 12/25/2017   Procedure: TRANSESOPHAGEAL ECHOCARDIOGRAM (TEE);  Surgeon: [name], MD;  Location: [facility] ENDOSCOPY;  Service: Cardiovascular;  Laterality: N/A;   TEMPORARY PACEMAKER INSERTION  11/04/2014   Procedure: TEMPORARY PACEMAKER INSERTION;  Surgeon: Troy Sine, MD;  Location: [facility] CATH LAB;  Service: Cardiovascular;;     Current Meds  Medication Sig   acetaminophen (TYLENOL) 500 MG tablet Take 500 mg by mouth every 6 (six) hours as needed for moderate pain.    allopurinol (ZYLOPRIM) 300 MG tablet Take 1 tablet (300 mg total) by mouth daily.   atorvastatin (LIPITOR) 10 MG tablet Take 10 mg by mouth daily.   carvedilol (COREG) 3.125 MG tablet Take 1 tablet (3.125 mg total) by mouth 2 (two) times daily with a meal.   furosemide (LASIX) 20 MG tablet TAKE  3 TABLETS (60 MG TOTAL) BY MOUTH 2 (TWO) TIMES DAILY.   losartan (COZAAR) 25 MG tablet Take 1 tablet (25 mg total) by mouth 2 (two) times daily.   magnesium oxide (MAG-OX) 400 MG tablet Take 1 tablet (400 mg total) by mouth 3 (three) times daily.   rivaroxaban (XARELTO) 20 MG TABS tablet Take 1 tablet (20 mg total) by mouth daily with supper.   spironolactone (ALDACTONE) 25 MG tablet Take 1 tablet (25 mg total) by mouth daily.     Allergies:   Bee venom; Codeine; Shrimp [shellfish allergy]; and Tomato   Social History   Tobacco Use   Smoking status: Former Smoker    Packs/day: 0.10    Years: 56.00    Pack years: 5.60    Types: Cigarettes    Last attempt to quit: 2019    Years since quitting: 1.3   Smokeless tobacco: Never Used  Substance Use Topics   Alcohol use: No    Alcohol/week: 0.0 standard drinks   Drug use: No     Family  Hx: [name] family history includes Cancer in her mother; Heart disease in her father.  ROS:   Please see the history of present illness.    Positive for chest pain.  No significant shortness of.  Positive for indigestion, no melena All other systems reviewed and are negative.   Prior CV studies:   The following studies were reviewed today:  As above  Labs/Other Tests and Data Reviewed:    EKG:  An ECG dated 08/03/2018 was personally reviewed today and demonstrated:  T wave inversion noted in the lateral leads  Recent Labs: 02/01/2018: ALT 29; TSH 0.735 08/03/2018: B Natriuretic Peptide 30.5; BUN 28; Creatinine, Ser 1.50; Potassium 4.0; Sodium 140   Recent Lipid Panel Lab Results  Component Value Date/Time   CHOL 116 11/12/2014 04:32 AM   TRIG 111 11/12/2014 04:32 AM   HDL 34 (L) 11/12/2014 04:32 AM   CHOLHDL 3.4 11/12/2014 04:32 AM   LDLCALC 60 11/12/2014 04:32 AM    Wt Readings from Last 3 Encounters:  12/17/18 228 lb (103.4 kg)  12/14/18 228 lb 9.6 oz (103.7 kg)  12/07/18 228 lb 9.6 oz (103.7 kg)     Objective:    Vital Signs:  Ht 5' 7.5" (1.715 m)    Wt 228 lb (103.4 kg)    BMI 35.18 kg/m    VITAL SIGNS:  reviewed  ASSESSMENT & PLAN:    Ongoing chest pain - Personally reviewed nuclear stress test from 08/2018-there is no evidence of ischemia present.  Excellent study.  Post CABG.  EF 44%.  I think that the majority of her pain is either musculoskeletal given a positional component or perhaps GI related, GERD/hiatal hernia.  I would recommend that a PPI be administered. - Certainly challenging to obtain a clear history and she has clear skepticism for several medical professionals.  Combined systolic diastolic heart failure - Has been seen in the past by advanced heart failure clinic, Dr. Jeffie Pollock. - EF in the 30 to 35% range.  44% on nuclear stress test 09/07/2018, with no ischemia.  NYHA class II overall.  Paramedics have been visiting her house.  Lasix  has been 60 mg twice a day, spironolactone 12.5 mg a day and carvedilol 3.25 mg twice a day - She had bradycardia so she is off of high-dose beta-blocker.  She is also unable to tolerate Entresto secondary to chest pain, seems to be doing fairly well with  losartan 25 twice daily.  Coronary artery disease -Status post CABG in 2016 LIMA to LAD SVG to OM.  Myoview with no ischemia  Paroxysmal atrial fibrillation - Off of amiodarone and high-dose beta-blocker because of bradycardia.  Remains in sinus rhythm after cardioversion 12/25/2017. -Xarelto for anticoagulation.  Essential hypertension -Currently well controlled.  History of smoking -Stop smoking 10/2017  Hyperlipidemia - No changes made to medications.  Suspected sleep apnea - Unable to afford second sleep study.  History of noncompliance, poor insight.  I explained to her that we do have a care team here where physicians as well as nurse practitioners and PA see our patients.  COVID-19 Education: The signs and symptoms of COVID-19 were discussed with the patient and how to seek care for testing (follow up with PCP or arrange E-visit).  The importance of social distancing was discussed today.  Time:   Today, I have spent 35 minutes with the patient with telehealth technology discussing the above problems reviewing records.     Medication Adjustments/Labs and Tests Ordered: Current medicines are reviewed at length with the patient today.  Concerns regarding medicines are outlined above.   Tests Ordered: No orders of the defined types were placed in this encounter.   Medication Changes: No orders of the defined types were placed in this encounter.   Disposition:  Follow up in 6 month(s) with Lori, 12 with me.   Signed, [name], MD  12/17/2018 3:54 PM    [facility]

## 2018-12-17 NOTE — Patient Instructions (Signed)
Medication Instructions:  The current medical regimen is effective;  continue present plan and medications.  If you need a refill on your cardiac medications before your next appointment, please call your pharmacy.   Follow-Up: Follow up in 6 months with Lori Gerhardt, NP and 1 year with Dr. Skains.  You will receive a letter in the mail 2 months before you are due.  Please call us when you receive this letter to schedule your follow up appointment.  Thank you for choosing [facility]!!     

## 2018-12-19 ENCOUNTER — Telehealth (HOSPITAL_COMMUNITY): Payer: Self-pay | Admitting: Licensed Clinical Social Worker

## 2018-12-19 NOTE — Telephone Encounter (Signed)
CSW reached out to pt to check in regarding food and medication status at this time. Pt reports she has everything she needs at this time.  Is maintaining social distancing rules and wears a mask if she has to go out.  Pt lives with a roommate and they have been staying busy by socializing on the porch and enjoying the weather.  CSW encouraged pt to reach out with any concerns and will continue to follow and assist as needed  Jorge Ny, [place] Worker [facility] [phone]

## 2018-12-26 ENCOUNTER — Telehealth (HOSPITAL_COMMUNITY): Payer: Self-pay

## 2018-12-26 NOTE — Telephone Encounter (Signed)
I called [name] [name] to check in and schedule an appointment. She stated she was doing well and would be available for a visit on Friday at 13:00. This time fit my schedule as well so we agreed to meet then.

## 2018-12-27 ENCOUNTER — Other Ambulatory Visit (HOSPITAL_COMMUNITY): Payer: Self-pay | Admitting: Student

## 2018-12-28 ENCOUNTER — Other Ambulatory Visit (HOSPITAL_COMMUNITY): Payer: Self-pay

## 2018-12-28 NOTE — Progress Notes (Signed)
Paramedicine Encounter    Patient ID: [name] [name], female    DOB: Aug 08, 1946, 73 y.o.   MRN: [id number]   Patient Care Team: [name], MD as PCP - General (Internal Medicine) Jerline Pain, MD as PCP - Cardiology (Cardiology) Debara Pickett Nadean Corwin, MD as Consulting Physician (Cardiology) Jorge Ny, LCSW as Social Worker (Licensed Clinical Social Worker)  Patient Active Problem List   Diagnosis Date Noted  . Postmenopausal vaginal bleeding 01/04/2018  . Snoring 01/04/2018  . Acute on chronic combined systolic and diastolic CHF (congestive heart failure) ([place])   . CHF exacerbation ([place]) 10/16/2017  . Noncompliance 10/16/2017  . Acute on chronic congestive heart failure ([place])   . Influenza with respiratory manifestation flu a + 09/14/16 started on tamiflu 09/15/17 09/19/2017  . Thrombocytopenia ([place]) 09/14/2017  . Acute drug-induced gout of right foot   . Medication noncompliance due to cognitive impairment 09/06/2017  . Acute diastolic CHF (congestive heart failure) ([place]) 08/06/2017  . LGI bleed, likely hemorrhoids 08/06/2017  . Atrial fibrillation ([place]) 08/04/2017  . Paroxysmal atrial fibrillation ([place]) 02/08/2017  . Cardiomyopathy, ischemic 02/08/2017  . Dysphagia 02/08/2017  . CKD (chronic kidney disease), stage III ([place]) 01/30/2017  . Pain in shoulder 02/08/2016  . Breast pain, left 02/08/2016  . Bilateral arm numbness and tingling while sleeping 02/08/2016  . Painful lumpy left breast 09/23/2015  . Candidal intertrigo 02/11/2015  . S/P CABG x 2 11/14/2014  . Accelerated hypertension   . Cardiac arrest ([place]) 11/04/2014  . Left main coronary artery disease 11/04/2014  . Coronary artery disease due to lipid rich plaque   . Acute respiratory failure with hypoxemia ([place])   . Essential hypertension   . Atrial fibrillation with RVR ([place])   . Hypokalemia 10/30/2014  . Chronic diastolic CHF (congestive heart failure) ([place]) 10/30/2014  . NSTEMI (non-ST elevated myocardial  infarction) ([place])   . DOE (dyspnea on exertion)   . CAP (community acquired pneumonia) 10/29/2014    Current Outpatient Medications:  .  acetaminophen (TYLENOL) 500 MG tablet, Take 500 mg by mouth every 6 (six) hours as needed for moderate pain. , Disp: , Rfl:  .  allopurinol (ZYLOPRIM) 300 MG tablet, Take 1 tablet (300 mg total) by mouth daily., Disp: 30 tablet, Rfl: 11 .  atorvastatin (LIPITOR) 10 MG tablet, Take 10 mg by mouth daily., Disp: , Rfl:  .  carvedilol (COREG) 3.125 MG tablet, Take 1 tablet (3.125 mg total) by mouth 2 (two) times daily with a meal., Disp: 60 tablet, Rfl: 3 .  furosemide (LASIX) 20 MG tablet, TAKE 3 TABLETS (60 MG TOTAL) BY MOUTH 2 (TWO) TIMES DAILY., Disp: 180 tablet, Rfl: 11 .  losartan (COZAAR) 25 MG tablet, TAKE 1 TABLET BY MOUTH TWO TIMES DAILY., Disp: 180 tablet, Rfl: 1 .  magnesium oxide (MAG-OX) 400 MG tablet, Take 1 tablet (400 mg total) by mouth 3 (three) times daily., Disp: 270 tablet, Rfl: 3 .  rivaroxaban (XARELTO) 20 MG TABS tablet, Take 1 tablet (20 mg total) by mouth daily with supper., Disp: 30 tablet, Rfl: 11 .  spironolactone (ALDACTONE) 25 MG tablet, Take 1 tablet (25 mg total) by mouth daily., Disp: 30 tablet, Rfl: 6 Allergies  Allergen Reactions  . Bee Venom Anaphylaxis  . Codeine Nausea And Vomiting  . Shrimp [Shellfish Allergy] Swelling  . Tomato     Pt states it gives her gout      Social History   Socioeconomic History  . Marital status: Widowed  Spouse name: Not on file  . Number of children: 1  . Years of education: 86  . Highest education level: Not on file  Occupational History  . Not on file  Social Needs  . Financial resource strain: Not hard at all  . Food insecurity:    Worry: Never true    Inability: Never true  . Transportation needs:    Medical: No    Non-medical: No  Tobacco Use  . Smoking status: Former Smoker    Packs/day: 0.10    Years: 56.00    Pack years: 5.60    Types: Cigarettes    Last  attempt to quit: 2019    Years since quitting: 1.3  . Smokeless tobacco: Never Used  Substance and Sexual Activity  . Alcohol use: No    Alcohol/week: 0.0 standard drinks  . Drug use: No  . Sexual activity: Not on file  Lifestyle  . Physical activity:    Days per week: Not on file    Minutes per session: Not on file  . Stress: Not on file  Relationships  . Social connections:    Talks on phone: Not on file    Gets together: Not on file    Attends religious service: Not on file    Active member of club or organization: Not on file    Attends meetings of clubs or organizations: Not on file    Relationship status: Not on file  . Intimate partner violence:    Fear of current or ex partner: Not on file    Emotionally abused: Not on file    Physically abused: Not on file    Forced sexual activity: Not on file  Other Topics Concern  . Not on file  Social History Narrative   Patient reports it being difficult to pay for everything due to outstanding medical bills from hospital stay last year but states she is able to keep up with basic expenses.  Patient does have some concerns with her house's condition due to a water leak- had her roof repaired last month but has some leaking in the front which has affected her porch.  Patient owns her own car and is able to drive herself but has some concerns about the reliability of her car- has gotten taxi to the clinic for appointment in the past when her car broke down.    Physical Exam Cardiovascular:     Rate and Rhythm: Normal rate and regular rhythm.     Pulses: Normal pulses.  Pulmonary:     Effort: Pulmonary effort is normal.     Breath sounds: Normal breath sounds.  Musculoskeletal: Normal range of motion.     Right lower leg: Edema present.     Left lower leg: Edema present.  Skin:    General: Skin is warm and dry.     Capillary Refill: Capillary refill takes less than 2 seconds.  Neurological:     Mental Status: She is alert and  oriented to person, place, and time.  Psychiatric:        Mood and Affect: Mood normal.         No future appointments.  BP 108/61 (BP Location: Left Arm, Patient Position: Sitting, Cuff Size: Normal)   Pulse 66   Resp 16   Wt 227 lb (103 kg)   SpO2 97%   BMI 35.03 kg/m   Weight yesterday- 226 lb Last visit weight- 228.9 lb  Ms Monter was seen at home  today and reported feeling well. She denied chest pain, SOB, headache, dizziness, orthopnea, cough or fever since our last visit. She continues to use her pill packs and seems to be doing well with it. She stated she has been compliant with her medications and her weight has been stable. I contacted the pharmacy and confirmed her next delivery of medications will be next week on the 20th. I will follow up in two weeks.   [name], EMT 12/28/18  ACTION: Home visit completed Next visit planned for 2 weeks

## 2019-01-03 ENCOUNTER — Telehealth (HOSPITAL_COMMUNITY): Payer: Self-pay

## 2019-01-03 NOTE — Telephone Encounter (Signed)
I called [name] [name] to see how she has been feeling this week. She reported that she has been well and was not in need of a visit. I asked if her medications had been delivered and she advised the new box was delivered yesterday. I will follow up with her next week.

## 2019-01-10 ENCOUNTER — Telehealth (HOSPITAL_COMMUNITY): Payer: Self-pay
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
## 2019-01-11 ENCOUNTER — Other Ambulatory Visit (HOSPITAL_COMMUNITY): Payer: Self-pay

## 2019-01-11 NOTE — Progress Notes (Signed)
Paramedicine Encounter    Patient ID: [name] [name], female    DOB: [birth year], 73 y.o.   MRN: [id number]   Patient Care Team: [name], MD as PCP - General (Internal Medicine) Jerline Pain, MD as PCP - Cardiology (Cardiology) Debara Pickett Nadean Corwin, MD as Consulting Physician (Cardiology) Jorge Ny, LCSW as Social Worker (Licensed Clinical Social Worker)  Patient Active Problem List   Diagnosis Date Noted  . Postmenopausal vaginal bleeding 01/04/2018  . Snoring 01/04/2018  . Acute on chronic combined systolic and diastolic CHF (congestive heart failure) ([place])   . CHF exacerbation ([place]) 10/16/2017  . Noncompliance 10/16/2017  . Acute on chronic congestive heart failure ([place])   . Influenza with respiratory manifestation flu a + 09/14/16 started on tamiflu 09/15/17 09/19/2017  . Thrombocytopenia ([place]) 09/14/2017  . Acute drug-induced gout of right foot   . Medication noncompliance due to cognitive impairment 09/06/2017  . Acute diastolic CHF (congestive heart failure) ([place]) 08/06/2017  . LGI bleed, likely hemorrhoids 08/06/2017  . Atrial fibrillation ([place]) 08/04/2017  . Paroxysmal atrial fibrillation ([place]) 02/08/2017  . Cardiomyopathy, ischemic 02/08/2017  . Dysphagia 02/08/2017  . CKD (chronic kidney disease), stage III ([place]) 01/30/2017  . Pain in shoulder 02/08/2016  . Breast pain, left 02/08/2016  . Bilateral arm numbness and tingling while sleeping 02/08/2016  . Painful lumpy left breast 09/23/2015  . Candidal intertrigo 02/11/2015  . S/P CABG x 2 11/14/2014  . Accelerated hypertension   . Cardiac arrest ([place]) 11/04/2014  . Left main coronary artery disease 11/04/2014  . Coronary artery disease due to lipid rich plaque   . Acute respiratory failure with hypoxemia ([place])   . Essential hypertension   . Atrial fibrillation with RVR ([place])   . Hypokalemia 10/30/2014  . Chronic diastolic CHF (congestive heart failure) ([place]) 10/30/2014  . NSTEMI (non-ST elevated myocardial  infarction) ([place])   . DOE (dyspnea on exertion)   . CAP (community acquired pneumonia) 10/29/2014    Current Outpatient Medications:  .  acetaminophen (TYLENOL) 500 MG tablet, Take 500 mg by mouth every 6 (six) hours as needed for moderate pain. , Disp: , Rfl:  .  allopurinol (ZYLOPRIM) 300 MG tablet, Take 1 tablet (300 mg total) by mouth daily., Disp: 30 tablet, Rfl: 11 .  atorvastatin (LIPITOR) 10 MG tablet, Take 10 mg by mouth daily., Disp: , Rfl:  .  carvedilol (COREG) 3.125 MG tablet, Take 1 tablet (3.125 mg total) by mouth 2 (two) times daily with a meal., Disp: 60 tablet, Rfl: 3 .  furosemide (LASIX) 20 MG tablet, TAKE 3 TABLETS (60 MG TOTAL) BY MOUTH 2 (TWO) TIMES DAILY., Disp: 180 tablet, Rfl: 11 .  losartan (COZAAR) 25 MG tablet, TAKE 1 TABLET BY MOUTH TWO TIMES DAILY., Disp: 180 tablet, Rfl: 1 .  magnesium oxide (MAG-OX) 400 MG tablet, Take 1 tablet (400 mg total) by mouth 3 (three) times daily., Disp: 270 tablet, Rfl: 3 .  rivaroxaban (XARELTO) 20 MG TABS tablet, Take 1 tablet (20 mg total) by mouth daily with supper., Disp: 30 tablet, Rfl: 11 .  spironolactone (ALDACTONE) 25 MG tablet, Take 1 tablet (25 mg total) by mouth daily., Disp: 30 tablet, Rfl: 6 Allergies  Allergen Reactions  . Bee Venom Anaphylaxis  . Codeine Nausea And Vomiting  . Shrimp [Shellfish Allergy] Swelling  . Tomato     Pt states it gives her gout      Social History   Socioeconomic History  . Marital status: Widowed  Spouse name: Not on file  . Number of children: 1  . Years of education: 18  . Highest education level: Not on file  Occupational History  . Not on file  Social Needs  . Financial resource strain: Not hard at all  . Food insecurity:    Worry: Never true    Inability: Never true  . Transportation needs:    Medical: No    Non-medical: No  Tobacco Use  . Smoking status: Former Smoker    Packs/day: 0.10    Years: 56.00    Pack years: 5.60    Types: Cigarettes    Last  attempt to quit: 2019    Years since quitting: 1.4  . Smokeless tobacco: Never Used  Substance and Sexual Activity  . Alcohol use: No    Alcohol/week: 0.0 standard drinks  . Drug use: No  . Sexual activity: Not on file  Lifestyle  . Physical activity:    Days per week: Not on file    Minutes per session: Not on file  . Stress: Not on file  Relationships  . Social connections:    Talks on phone: Not on file    Gets together: Not on file    Attends religious service: Not on file    Active member of club or organization: Not on file    Attends meetings of clubs or organizations: Not on file    Relationship status: Not on file  . Intimate partner violence:    Fear of current or ex partner: Not on file    Emotionally abused: Not on file    Physically abused: Not on file    Forced sexual activity: Not on file  Other Topics Concern  . Not on file  Social History Narrative   Patient reports it being difficult to pay for everything due to outstanding medical bills from hospital stay last year but states she is able to keep up with basic expenses.  Patient does have some concerns with her house's condition due to a water leak- had her roof repaired last month but has some leaking in the front which has affected her porch.  Patient owns her own car and is able to drive herself but has some concerns about the reliability of her car- has gotten taxi to the clinic for appointment in the past when her car broke down.    Physical Exam Cardiovascular:     Rate and Rhythm: Normal rate and regular rhythm.     Pulses: Normal pulses.  Pulmonary:     Effort: Pulmonary effort is normal.     Breath sounds: Normal breath sounds.  Musculoskeletal: Normal range of motion.     Right lower leg: Edema present.     Left lower leg: Edema present.  Skin:    General: Skin is warm and dry.     Capillary Refill: Capillary refill takes 2 to 3 seconds.  Neurological:     Mental Status: She is alert and  oriented to person, place, and time.  Psychiatric:        Mood and Affect: Mood normal.         No future appointments.  BP (!) 149/69 (BP Location: Left Arm, Patient Position: Sitting, Cuff Size: Normal)   Pulse 60   Resp 16   Wt 229 lb (103.9 kg)   SpO2 97%   BMI 35.34 kg/m   Weight yesterday-228 lb Last visit weight- 227 lb  Ms Esquibel was seen at home  today and reported feeling well. She denied chest pain, SOB, headache, dizziness, orthopnea, cough or fever since our last visit. She stated she has been compliant with her medications over the past two weeks and her weight has been stable. Her medications were verified and her pill packs were checked for accuracy. I will follow up in two weeks.   [name], EMT 01/11/19  ACTION: Home visit completed Next visit planned for 2 weeks

## 2019-01-15 ENCOUNTER — Telehealth (HOSPITAL_COMMUNITY): Payer: Self-pay | Admitting: Licensed Clinical Social Worker

## 2019-01-15 NOTE — Telephone Encounter (Signed)
CSW reached out to pt to check in regarding food and medication status at this time. Pt reports she has everything she needs at this time.  CSW encouraged pt to reach out with any concerns and will continue to follow and assist as needed  Dallana Mavity H. Kimarie Coor, LCSW Clinical Social Worker [facility] [phone]   

## 2019-01-22 ENCOUNTER — Telehealth (HOSPITAL_COMMUNITY): Payer: Self-pay

## 2019-01-22 NOTE — Telephone Encounter (Signed)
I called [name] [name] to schedule an appointment. She stated she was going to be running errands today and asked if I could come later in the week. We agreed to meet Thursday at 13:00.

## 2019-01-24 DIAGNOSIS — E7800 Pure hypercholesterolemia, unspecified: Secondary | ICD-10-CM | POA: Diagnosis not present

## 2019-01-24 DIAGNOSIS — J449 Chronic obstructive pulmonary disease, unspecified: Secondary | ICD-10-CM | POA: Diagnosis not present

## 2019-01-24 DIAGNOSIS — M1990 Unspecified osteoarthritis, unspecified site: Secondary | ICD-10-CM | POA: Diagnosis not present

## 2019-01-24 DIAGNOSIS — I25810 Atherosclerosis of coronary artery bypass graft(s) without angina pectoris: Secondary | ICD-10-CM | POA: Diagnosis not present

## 2019-01-24 DIAGNOSIS — I10 Essential (primary) hypertension: Secondary | ICD-10-CM | POA: Diagnosis not present

## 2019-01-24 DIAGNOSIS — I5020 Unspecified systolic (congestive) heart failure: Secondary | ICD-10-CM | POA: Diagnosis not present

## 2019-01-24 DIAGNOSIS — I4891 Unspecified atrial fibrillation: Secondary | ICD-10-CM | POA: Diagnosis not present

## 2019-01-24 DIAGNOSIS — I214 Non-ST elevation (NSTEMI) myocardial infarction: Secondary | ICD-10-CM | POA: Diagnosis not present

## 2019-01-24 DIAGNOSIS — N183 Chronic kidney disease, stage 3 (moderate): Secondary | ICD-10-CM | POA: Diagnosis not present

## 2019-02-07 ENCOUNTER — Telehealth (HOSPITAL_COMMUNITY): Payer: Self-pay

## 2019-02-07 NOTE — Telephone Encounter (Signed)
Spoke to [name] [name] in which she stated she has all of her medications and does not require a home visit at this time. Will follow up in a few weeks.

## 2019-02-08 ENCOUNTER — Telehealth (HOSPITAL_COMMUNITY): Payer: Self-pay

## 2019-02-08 NOTE — Telephone Encounter (Signed)
I called Mrs. Gothard to confirm our visit.  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  Her brother has cancer and is getting radiation".  She asked me to call her back next week.

## 2019-02-11 ENCOUNTER — Encounter: Payer: Self-pay | Admitting: Neurology

## 2019-02-18 ENCOUNTER — Telehealth (HOSPITAL_COMMUNITY): Payer: Self-pay | Admitting: Licensed Clinical Social Worker

## 2019-02-18 NOTE — Telephone Encounter (Signed)
Pt enrolled in Moms Meals 3 month study through [facility]- patient's 3 month period has now ended so CSW spoke with pt to complete an exit interview regarding patient's experience:  1. Since being on Moms Meals have you noticed any changes in your health or the way you feel physically? no Weight loss? States she was gaining weight Improvement in BP? no  2. What do you like about the program? Thought the food tasted good. What would you like to change/add? Stated it was too much food and that she would run out of room in her refrigerator.  3. How did you get food prior to [place]? a. Did you get food stamps? no b. Did someone help financially? no c. Did you access food banks? no d. Do you anticipate any issues transitioning back to getting your own food after this program ends? no  4. Would you be interested in future food pilots? Yes depending on the program.  5. Would you like a dietary consult? yes  6. Do you have internet access? Tablet/Smart Phone? no   Jorge Ny, LCSW Clinical Social Worker [facility] Desk#: [phone] Cell#: [phone]

## 2019-02-25 DIAGNOSIS — M109 Gout, unspecified: Secondary | ICD-10-CM | POA: Diagnosis not present

## 2019-03-01 ENCOUNTER — Telehealth (HOSPITAL_COMMUNITY): Payer: Self-pay

## 2019-03-01 NOTE — Telephone Encounter (Signed)
I called [name] [name] to check on her for the week.  When we last spoke she said that she was going through a lot, with the passing of a client.  Her brother is also receiving chemo-therapy, which she'll be the main caregiver "for a while".    I left a message on her voicemail that I will be on vacation next week and if she need anything to call the heart failure clinic or zak.  I will also have my office cell with me during vacation.

## 2019-03-13 DIAGNOSIS — M1990 Unspecified osteoarthritis, unspecified site: Secondary | ICD-10-CM | POA: Diagnosis not present

## 2019-03-13 DIAGNOSIS — M8580 Other specified disorders of bone density and structure, unspecified site: Secondary | ICD-10-CM | POA: Diagnosis not present

## 2019-03-13 DIAGNOSIS — I4891 Unspecified atrial fibrillation: Secondary | ICD-10-CM | POA: Diagnosis not present

## 2019-03-13 DIAGNOSIS — I214 Non-ST elevation (NSTEMI) myocardial infarction: Secondary | ICD-10-CM | POA: Diagnosis not present

## 2019-03-13 DIAGNOSIS — N183 Chronic kidney disease, stage 3 (moderate): Secondary | ICD-10-CM | POA: Diagnosis not present

## 2019-03-13 DIAGNOSIS — I10 Essential (primary) hypertension: Secondary | ICD-10-CM | POA: Diagnosis not present

## 2019-03-13 DIAGNOSIS — E7800 Pure hypercholesterolemia, unspecified: Secondary | ICD-10-CM | POA: Diagnosis not present

## 2019-03-13 DIAGNOSIS — I25810 Atherosclerosis of coronary artery bypass graft(s) without angina pectoris: Secondary | ICD-10-CM | POA: Diagnosis not present

## 2019-03-13 DIAGNOSIS — J449 Chronic obstructive pulmonary disease, unspecified: Secondary | ICD-10-CM | POA: Diagnosis not present

## 2019-03-20 DIAGNOSIS — I10 Essential (primary) hypertension: Secondary | ICD-10-CM | POA: Diagnosis not present

## 2019-03-20 DIAGNOSIS — M8580 Other specified disorders of bone density and structure, unspecified site: Secondary | ICD-10-CM | POA: Diagnosis not present

## 2019-03-20 DIAGNOSIS — I25810 Atherosclerosis of coronary artery bypass graft(s) without angina pectoris: Secondary | ICD-10-CM | POA: Diagnosis not present

## 2019-03-20 DIAGNOSIS — N183 Chronic kidney disease, stage 3 (moderate): Secondary | ICD-10-CM | POA: Diagnosis not present

## 2019-03-20 DIAGNOSIS — I214 Non-ST elevation (NSTEMI) myocardial infarction: Secondary | ICD-10-CM | POA: Diagnosis not present

## 2019-03-20 DIAGNOSIS — I4891 Unspecified atrial fibrillation: Secondary | ICD-10-CM | POA: Diagnosis not present

## 2019-03-20 DIAGNOSIS — J449 Chronic obstructive pulmonary disease, unspecified: Secondary | ICD-10-CM | POA: Diagnosis not present

## 2019-03-20 DIAGNOSIS — M1990 Unspecified osteoarthritis, unspecified site: Secondary | ICD-10-CM | POA: Diagnosis not present

## 2019-03-20 DIAGNOSIS — E7800 Pure hypercholesterolemia, unspecified: Secondary | ICD-10-CM | POA: Diagnosis not present

## 2019-03-20 DIAGNOSIS — I509 Heart failure, unspecified: Secondary | ICD-10-CM | POA: Diagnosis not present

## 2019-03-20 DIAGNOSIS — I5020 Unspecified systolic (congestive) heart failure: Secondary | ICD-10-CM | POA: Diagnosis not present

## 2019-03-25 ENCOUNTER — Ambulatory Visit: Payer: PPO | Admitting: Neurology

## 2019-03-26 ENCOUNTER — Telehealth (HOSPITAL_COMMUNITY): Payer: Self-pay

## 2019-03-26 NOTE — Telephone Encounter (Signed)
I called Mrs. Segall to schedule an appointment.  She asked that I come next week, because she still have family in town. Her brother had surgery (leg amputation) and her family will still be here until sat.  I told her that I'll call her Tuesday for follow up.

## 2019-04-02 ENCOUNTER — Other Ambulatory Visit: Payer: Self-pay

## 2019-04-02 NOTE — Patient Outreach (Signed)
  [place] [facility]) Care Management Chronic Special Needs Program  04/02/2019  Name: [name] [name] DOB: Nov 12, 1945  MRN: [id number]  [name] [name] is enrolled in a Chronic Special Needs Plan. RNCM called to review health risk assessment and complete individualized care plan. Client reports it is not a good time to talk and states she will call RNCM back.   Plan: RNCM will await return call and follow up within 1-2 weeks if no return call.    Thea Silversmith, RN, MSN, [place] [phone]

## 2019-04-04 ENCOUNTER — Other Ambulatory Visit: Payer: Self-pay

## 2019-04-04 NOTE — Patient Outreach (Addendum)
  [place] [facility]) Care Management Chronic Special Needs Program  04/04/2019  Name: [name] [name] DOB: 1946/07/02  MRN: [id number]  [name] is enrolled in a chronic special needs plan for Heart Failure. Chronic Care Management Coordinator telephoned client to review health risk assessment and to develop individualized care plan.  Introduced the chronic care management program, importance of client participation, and taking their care plan to all provider appointments and inpatient facilities.    Subjective: Client reports history of heart failure, HTN, atrial fibrillation, heart attack. Client reports she is active and on the go all the time helping out her brother. Client reports being independent. Denies any problems with transportation to provider visits. She states she has paramedicine, but reports a change in personnel and does not know who is supposed to be seeing her now. Client states she does not weigh self regularly adding the paramedic used to help her keep track of her weights. She denies any difficulty affording medications . Client denies any social work needs at this time.   Goals Addressed            This Visit's Progress   . Client understands the importance of follow-up with providers by attending scheduled visits      . Client verbalize knowledge of Heart Failure disease self management skills within the next 6-9 months.      . Client verbalizes knowledge of Heart Attack self management skills within the next 6-9 months.      . Client will report weighing and recording weights at least three times/week within the next 6-9 months.      . Client will verbalize knowledge of self management of Hypertension as evidences by BP reading of 140/90 or less; or as defined by provider      . Client will verbalize self-management skills related to Atrial Fibrillation within the next 6-9 months.      . Maintain timely refills of Heart Failure medication as prescribed  within the year       . Obtain annual  Lipid Profile, LDL-C      . Visit Primary Care Provider or Cardiologist at least 2 times per year        [facility] reinforced the importance of daily weights, RNCM discussed the relationship between sodium and weight gain.  Reinforced availability of 24 hour nurse advice line. Encouraged client to call health care concierge as needed. Encouraged client to call RNCM as needed.  Plan: RNCM will follow up and update assigned paramedic. Send successful outreach letter with a copy of their individualized care plan, Send individual care plan to provider and Send educational material Chronic care management coordination will outreach in: 6 months. RNCM will refer to pharmacy for medication review.   Thea Silversmith, RN, MSN, [place] [phone]

## 2019-04-05 ENCOUNTER — Other Ambulatory Visit: Payer: Self-pay

## 2019-04-05 ENCOUNTER — Other Ambulatory Visit: Payer: Self-pay | Admitting: Pharmacist

## 2019-04-05 ENCOUNTER — Encounter: Payer: Self-pay | Admitting: Pharmacist

## 2019-04-05 NOTE — Patient Outreach (Signed)
Received a Pharmacy Assistance referral for [name] [name]. Will refer [name] [name] to [employer] who is the Clinical research associate for [employer].

## 2019-04-05 NOTE — Patient Outreach (Signed)
[place] [facility]) Care Management  04/05/2019  TONJIA VIVAS 1946-03-14 [id number]   Patient will be followed by [facility]ist as they have a contract with the patient's PCP office.  Plan: Remove myself from her care team.  [name], PharmD, [place] Clinical Pharmacist [phone]

## 2019-04-05 NOTE — Patient Outreach (Signed)
  [place]) Care Management Chronic Special Needs Program   04/05/2019  Name: Indee, Grzeskowiak: [birth year]  MRN: [id number]  The client was discussed in an interdisciplinary care team meeting.  The following issues were discussed:  Client's needs and Coordination of care  Participants present:  Demetria Copper, EMT; Thea Silversmith, RNCM  Plan: Ms. Copper to continue to reach out to client to schedule visit. Continue care coordination with EMT. RNCM will outreach to client at next scheduled outreach.   Thea Silversmith, RN, MSN, [place] [employer] [phone]

## 2019-04-09 ENCOUNTER — Other Ambulatory Visit (HOSPITAL_COMMUNITY): Payer: Self-pay

## 2019-04-09 ENCOUNTER — Telehealth (HOSPITAL_COMMUNITY): Payer: Self-pay

## 2019-04-09 NOTE — Telephone Encounter (Signed)
I called [name] [name] to schedule [facility] visit.  We agreed on 1pm.

## 2019-04-09 NOTE — Progress Notes (Signed)
Paramedicine Encounter    Patient ID: [name] [name], female    DOB: 1945-08-24, 73 y.o.   MRN: [id number]    Patient Care Team: [name], MD as PCP - General (Internal Medicine) Jerline Pain, MD as PCP - Cardiology (Cardiology) Debara Pickett Nadean Corwin, MD as Consulting Physician (Cardiology) Jorge Ny, LCSW as Social Worker (Licensed Clinical Social Worker) Luretha Rued, RN as [place] Management  Patient Active Problem List   Diagnosis Date Noted  . Postmenopausal vaginal bleeding 01/04/2018  . Snoring 01/04/2018  . Acute on chronic combined systolic and diastolic CHF (congestive heart failure) ([place])   . CHF exacerbation ([place]) 10/16/2017  . Noncompliance 10/16/2017  . Acute on chronic congestive heart failure ([place])   . Influenza with respiratory manifestation flu a + 09/14/16 started on tamiflu 09/15/17 09/19/2017  . Thrombocytopenia ([place]) 09/14/2017  . Acute drug-induced gout of right foot   . Medication noncompliance due to cognitive impairment 09/06/2017  . Acute diastolic CHF (congestive heart failure) ([place]) 08/06/2017  . LGI bleed, likely hemorrhoids 08/06/2017  . Atrial fibrillation ([place]) 08/04/2017  . Paroxysmal atrial fibrillation ([place]) 02/08/2017  . Cardiomyopathy, ischemic 02/08/2017  . Dysphagia 02/08/2017  . CKD (chronic kidney disease), stage III ([place]) 01/30/2017  . Pain in shoulder 02/08/2016  . Breast pain, left 02/08/2016  . Bilateral arm numbness and tingling while sleeping 02/08/2016  . Painful lumpy left breast 09/23/2015  . Candidal intertrigo 02/11/2015  . S/P CABG x 2 11/14/2014  . Accelerated hypertension   . Cardiac arrest ([place]) 11/04/2014  . Left main coronary artery disease 11/04/2014  . Coronary artery disease due to lipid rich plaque   . Acute respiratory failure with hypoxemia ([place])   . Essential hypertension   . Atrial fibrillation with RVR ([place])   . Hypokalemia 10/30/2014  . Chronic diastolic CHF (congestive heart  failure) ([place]) 10/30/2014  . NSTEMI (non-ST elevated myocardial infarction) ([place])   . DOE (dyspnea on exertion)   . CAP (community acquired pneumonia) 10/29/2014    Current Outpatient Medications:  .  allopurinol (ZYLOPRIM) 300 MG tablet, Take 1 tablet (300 mg total) by mouth daily., Disp: 30 tablet, Rfl: 11 .  atorvastatin (LIPITOR) 10 MG tablet, Take 10 mg by mouth daily., Disp: , Rfl:  .  carvedilol (COREG) 3.125 MG tablet, Take 1 tablet (3.125 mg total) by mouth 2 (two) times daily with a meal., Disp: 60 tablet, Rfl: 3 .  furosemide (LASIX) 20 MG tablet, TAKE 3 TABLETS (60 MG TOTAL) BY MOUTH 2 (TWO) TIMES DAILY., Disp: 180 tablet, Rfl: 11 .  losartan (COZAAR) 25 MG tablet, TAKE 1 TABLET BY MOUTH TWO TIMES DAILY., Disp: 180 tablet, Rfl: 1 .  magnesium oxide (MAG-OX) 400 MG tablet, Take 1 tablet (400 mg total) by mouth 3 (three) times daily., Disp: 270 tablet, Rfl: 3 .  pantoprazole (PROTONIX) 40 MG tablet, Take 40 mg by mouth daily., Disp: , Rfl:  .  rivaroxaban (XARELTO) 20 MG TABS tablet, Take 1 tablet (20 mg total) by mouth daily with supper., Disp: 30 tablet, Rfl: 11 .  acetaminophen (TYLENOL) 500 MG tablet, Take 500 mg by mouth every 6 (six) hours as needed for moderate pain. , Disp: , Rfl:  .  spironolactone (ALDACTONE) 25 MG tablet, Take 1 tablet (25 mg total) by mouth daily., Disp: 30 tablet, Rfl: 6 Allergies  Allergen Reactions  . Bee Venom Anaphylaxis  . Codeine Nausea And Vomiting  . Shrimp [Shellfish Allergy] Swelling  .  Tomato     Pt states it gives her gout     Social History   Socioeconomic History  . Marital status: Widowed    Spouse name: Not on file  . Number of children: 1  . Years of education: 79  . Highest education level: Not on file  Occupational History  . Not on file  Social Needs  . Financial resource strain: Not hard at all  . Food insecurity    Worry: Never true    Inability: Never true  . Transportation needs    Medical: No    Non-medical:  No  Tobacco Use  . Smoking status: Former Smoker    Packs/day: 0.10    Years: 56.00    Pack years: 5.60    Types: Cigarettes    Quit date: 2019    Years since quitting: 1.6  . Smokeless tobacco: Never Used  Substance and Sexual Activity  . Alcohol use: No    Alcohol/week: 0.0 standard drinks  . Drug use: No  . Sexual activity: Not on file  Lifestyle  . Physical activity    Days per week: Not on file    Minutes per session: Not on file  . Stress: Not on file  Relationships  . Social Herbalist on phone: Not on file    Gets together: Not on file    Attends religious service: Not on file    Active member of club or organization: Not on file    Attends meetings of clubs or organizations: Not on file    Relationship status: Not on file  . Intimate partner violence    Fear of current or ex partner: Not on file    Emotionally abused: Not on file    Physically abused: Not on file    Forced sexual activity: Not on file  Other Topics Concern  . Not on file  Social History Narrative   Patient reports it being difficult to pay for everything due to outstanding medical bills from hospital stay last year but states she is able to keep up with basic expenses.  Patient does have some concerns with her house's condition due to a water leak- had her roof repaired last month but has some leaking in the front which has affected her porch.  Patient owns her own car and is able to drive herself but has some concerns about the reliability of her car- has gotten taxi to the clinic for appointment in the past when her car broke down.    Physical Exam Pulmonary:     Effort: Pulmonary effort is normal. No respiratory distress.  Musculoskeletal:     Right lower leg: No edema.     Left lower leg: No edema.  Skin:    General: Skin is warm and dry.         Future Appointments  Date Time Provider [place]  09/10/2019 11:00 AM Luretha Rued, RN [facility] None     BP  130/90 (BP Location: Left Arm, Patient Position: Sitting, Cuff Size: Normal)   Pulse 68   Temp 97.7 F (36.5 C)   Weight yesterday-233 Last visit weight-229 (01/11/19)  ATF pt CAO x4 sitting in the living room, she called zak earlier and stated that her pill packs "are messed up".  Upon my arrival she handed me the pill packs for this week.  She said that the order in which her pills are now is wrong and different than how  they were last month.  She said that the pills that they put together gave her a headache and she would like them back the way zak had them before.  I called [facility] to get the issue resolved, the pharmacist said that he'll call me back.  I filled a pill box for the meantime.  She denies sob, chest pain and dizziness.  She stated that she weighed yesterday but doesn't want to weigh today because she ate chicken and she "know that she's heavy".  I advised her to call me if her head continues to hurt after the changes that I made.   Preferred medication order:  Morning: alunprinoll, 3 furosemide, carvedilol, mag Afternoon: 3 furosemide, mag Evening: carvedilol, losartan, pantoprazole, xarelto, mag Bed: atorvastatin   Medication ordered: none  Kristofer Schaffert, EMT Paramedic [phone] 04/09/2019    ACTION: Home visit completed

## 2019-04-15 DIAGNOSIS — Z1389 Encounter for screening for other disorder: Secondary | ICD-10-CM | POA: Diagnosis not present

## 2019-04-15 DIAGNOSIS — I4891 Unspecified atrial fibrillation: Secondary | ICD-10-CM | POA: Diagnosis not present

## 2019-04-15 DIAGNOSIS — I25810 Atherosclerosis of coronary artery bypass graft(s) without angina pectoris: Secondary | ICD-10-CM | POA: Diagnosis not present

## 2019-04-15 DIAGNOSIS — Z0000 Encounter for general adult medical examination without abnormal findings: Secondary | ICD-10-CM | POA: Diagnosis not present

## 2019-04-15 DIAGNOSIS — J449 Chronic obstructive pulmonary disease, unspecified: Secondary | ICD-10-CM | POA: Diagnosis not present

## 2019-04-15 DIAGNOSIS — I700 Atherosclerosis of aorta: Secondary | ICD-10-CM | POA: Diagnosis not present

## 2019-04-15 DIAGNOSIS — R7303 Prediabetes: Secondary | ICD-10-CM | POA: Diagnosis not present

## 2019-04-15 DIAGNOSIS — K760 Fatty (change of) liver, not elsewhere classified: Secondary | ICD-10-CM | POA: Diagnosis not present

## 2019-04-15 DIAGNOSIS — M109 Gout, unspecified: Secondary | ICD-10-CM | POA: Diagnosis not present

## 2019-04-15 DIAGNOSIS — I5020 Unspecified systolic (congestive) heart failure: Secondary | ICD-10-CM | POA: Diagnosis not present

## 2019-04-15 DIAGNOSIS — E7800 Pure hypercholesterolemia, unspecified: Secondary | ICD-10-CM | POA: Diagnosis not present

## 2019-04-15 DIAGNOSIS — I10 Essential (primary) hypertension: Secondary | ICD-10-CM | POA: Diagnosis not present

## 2019-04-15 DIAGNOSIS — F411 Generalized anxiety disorder: Secondary | ICD-10-CM | POA: Diagnosis not present

## 2019-04-15 DIAGNOSIS — I214 Non-ST elevation (NSTEMI) myocardial infarction: Secondary | ICD-10-CM | POA: Diagnosis not present

## 2019-04-17 ENCOUNTER — Telehealth (HOSPITAL_COMMUNITY): Payer: Self-pay

## 2019-04-17 NOTE — Telephone Encounter (Signed)
Mrs. Emick returned my call and left a message for me to come by tomorrow.

## 2019-04-17 NOTE — Telephone Encounter (Signed)
Christie from [employer] called and said that Mrs. Kennemore called the office to get my phone number.  She said that she was on her last day of medications.  I told Adonis Brook about the concerns Mrs. Keirstead had last week about her medications.  Adonis Brook said that the packaging changed from one morning pack to two morning packs.  The medications is still given during the same time of day as before, the morning pack has been split in two.  I told her that I will confirm the packaging when I see Mrs. Brugman this week.

## 2019-04-18 ENCOUNTER — Telehealth (HOSPITAL_COMMUNITY): Payer: Self-pay

## 2019-04-18 ENCOUNTER — Other Ambulatory Visit (HOSPITAL_COMMUNITY): Payer: Self-pay

## 2019-04-18 NOTE — Progress Notes (Signed)
Paramedicine Encounter    Patient ID: [name] [name], female    DOB: 1945-08-31, 73 y.o.   MRN: [id number]    Patient Care Team: [name], MD as PCP - General (Internal Medicine) Jerline Pain, MD as PCP - Cardiology (Cardiology) Debara Pickett Nadean Corwin, MD as Consulting Physician (Cardiology) Jorge Ny, LCSW as Social Worker (Licensed Clinical Social Worker) Luretha Rued, RN as [place] Management  Patient Active Problem List   Diagnosis Date Noted  . Postmenopausal vaginal bleeding 01/04/2018  . Snoring 01/04/2018  . Acute on chronic combined systolic and diastolic CHF (congestive heart failure) ([place])   . CHF exacerbation ([place]) 10/16/2017  . Noncompliance 10/16/2017  . Acute on chronic congestive heart failure ([place])   . Influenza with respiratory manifestation flu a + 09/14/16 started on tamiflu 09/15/17 09/19/2017  . Thrombocytopenia ([place]) 09/14/2017  . Acute drug-induced gout of right foot   . Medication noncompliance due to cognitive impairment 09/06/2017  . Acute diastolic CHF (congestive heart failure) ([place]) 08/06/2017  . LGI bleed, likely hemorrhoids 08/06/2017  . Atrial fibrillation ([place]) 08/04/2017  . Paroxysmal atrial fibrillation ([place]) 02/08/2017  . Cardiomyopathy, ischemic 02/08/2017  . Dysphagia 02/08/2017  . CKD (chronic kidney disease), stage III ([place]) 01/30/2017  . Pain in shoulder 02/08/2016  . Breast pain, left 02/08/2016  . Bilateral arm numbness and tingling while sleeping 02/08/2016  . Painful lumpy left breast 09/23/2015  . Candidal intertrigo 02/11/2015  . S/P CABG x 2 11/14/2014  . Accelerated hypertension   . Cardiac arrest ([place]) 11/04/2014  . Left main coronary artery disease 11/04/2014  . Coronary artery disease due to lipid rich plaque   . Acute respiratory failure with hypoxemia ([place])   . Essential hypertension   . Atrial fibrillation with RVR ([place])   . Hypokalemia 10/30/2014  . Chronic diastolic CHF (congestive heart  failure) ([place]) 10/30/2014  . NSTEMI (non-ST elevated myocardial infarction) ([place])   . DOE (dyspnea on exertion)   . CAP (community acquired pneumonia) 10/29/2014    Current Outpatient Medications:  .  allopurinol (ZYLOPRIM) 300 MG tablet, Take 1 tablet (300 mg total) by mouth daily., Disp: 30 tablet, Rfl: 11 .  atorvastatin (LIPITOR) 10 MG tablet, Take 10 mg by mouth daily., Disp: , Rfl:  .  carvedilol (COREG) 3.125 MG tablet, Take 1 tablet (3.125 mg total) by mouth 2 (two) times daily with a meal., Disp: 60 tablet, Rfl: 3 .  furosemide (LASIX) 20 MG tablet, TAKE 3 TABLETS (60 MG TOTAL) BY MOUTH 2 (TWO) TIMES DAILY., Disp: 180 tablet, Rfl: 11 .  losartan (COZAAR) 25 MG tablet, TAKE 1 TABLET BY MOUTH TWO TIMES DAILY., Disp: 180 tablet, Rfl: 1 .  magnesium oxide (MAG-OX) 400 MG tablet, Take 1 tablet (400 mg total) by mouth 3 (three) times daily., Disp: 270 tablet, Rfl: 3 .  pantoprazole (PROTONIX) 40 MG tablet, Take 40 mg by mouth daily., Disp: , Rfl:  .  rivaroxaban (XARELTO) 20 MG TABS tablet, Take 1 tablet (20 mg total) by mouth daily with supper., Disp: 30 tablet, Rfl: 11 .  acetaminophen (TYLENOL) 500 MG tablet, Take 500 mg by mouth every 6 (six) hours as needed for moderate pain. , Disp: , Rfl:  .  spironolactone (ALDACTONE) 25 MG tablet, Take 1 tablet (25 mg total) by mouth daily., Disp: 30 tablet, Rfl: 6 Allergies  Allergen Reactions  . Bee Venom Anaphylaxis  . Codeine Nausea And Vomiting  . Shrimp [Shellfish Allergy] Swelling  .  Tomato     Pt states it gives her gout     Social History   Socioeconomic History  . Marital status: Widowed    Spouse name: Not on file  . Number of children: 1  . Years of education: 73  . Highest education level: Not on file  Occupational History  . Not on file  Social Needs  . Financial resource strain: Not hard at all  . Food insecurity    Worry: Never true    Inability: Never true  . Transportation needs    Medical: No    Non-medical:  No  Tobacco Use  . Smoking status: Former Smoker    Packs/day: 0.10    Years: 56.00    Pack years: 5.60    Types: Cigarettes    Quit date: 2019    Years since quitting: 1.6  . Smokeless tobacco: Never Used  Substance and Sexual Activity  . Alcohol use: No    Alcohol/week: 0.0 standard drinks  . Drug use: No  . Sexual activity: Not on file  Lifestyle  . Physical activity    Days per week: Not on file    Minutes per session: Not on file  . Stress: Not on file  Relationships  . Social Herbalist on phone: Not on file    Gets together: Not on file    Attends religious service: Not on file    Active member of club or organization: Not on file    Attends meetings of clubs or organizations: Not on file    Relationship status: Not on file  . Intimate partner violence    Fear of current or ex partner: Not on file    Emotionally abused: Not on file    Physically abused: Not on file    Forced sexual activity: Not on file  Other Topics Concern  . Not on file  Social History Narrative   Patient reports it being difficult to pay for everything due to outstanding medical bills from hospital stay last year but states she is able to keep up with basic expenses.  Patient does have some concerns with her house's condition due to a water leak- had her roof repaired last month but has some leaking in the front which has affected her porch.  Patient owns her own car and is able to drive herself but has some concerns about the reliability of her car- has gotten taxi to the clinic for appointment in the past when her car broke down.    Physical Exam Constitutional:      Appearance: She is not diaphoretic.  Pulmonary:     Effort: Pulmonary effort is normal. No respiratory distress.     Breath sounds: No wheezing.  Musculoskeletal:        General: No swelling.     Right lower leg: No edema.  Skin:    General: Skin is warm and dry.         Future Appointments  Date Time  Provider [place]  09/10/2019 11:00 AM Luretha Rued, RN [facility] None     BP 130/80 (BP Location: Left Arm, Patient Position: Sitting, Cuff Size: Normal)   Pulse 76   Temp (!) 94.8 F (34.9 C)   SpO2 98%   Weight yesterday-didn't weigh Last visit weight-didn't weigh  ATF pt CAO x4 sitting in the living room. She's still concerned about her pill packs. The color and shape of Losartan changed which confused her.  Lovena Le called during my visit and we were able to get the medications the way Mrs. Pizzimenti prefer.  She denies sob, chest pain and dizziness.  Her back her hurts, she said that she slept on her grandchild's bed last night.  She's still not weighing daily and makes up excuses to why she can't weigh during [facility] visits.  I refilled the pill box; the new pill packs will be delivered Monday.     Medication ordered: pill packs will be delivered Monday Kyrra Prada, EMT Paramedic [phone] 04/18/2019    ACTION: Home visit completed

## 2019-04-18 NOTE — Telephone Encounter (Signed)
[name] [name] called and requested that I come around 1 o'clock today.

## 2019-04-23 ENCOUNTER — Telehealth (HOSPITAL_COMMUNITY): Payer: Self-pay

## 2019-04-23 NOTE — Telephone Encounter (Signed)
[name] [name] said that [name] [name] called him yesterday but didn't leave a voicemail.  I called her back and left a message on her voicemail.

## 2019-04-25 ENCOUNTER — Other Ambulatory Visit (HOSPITAL_COMMUNITY): Payer: Self-pay

## 2019-04-25 NOTE — Progress Notes (Signed)
[name] [name] asked that I come over to fill a couple of days worth of meds in her pill box. Pill packs from [facility] should be delivered tomorrow. I will verify her pill packs when the arrive.

## 2019-04-30 ENCOUNTER — Other Ambulatory Visit (HOSPITAL_COMMUNITY): Payer: Self-pay

## 2019-04-30 NOTE — Progress Notes (Signed)
Paramedicine Encounter    Patient ID: [name] [name], female    DOB: [birth year], 73 y.o.   MRN: [id number]    Patient Care Team: [name], MD as PCP - General (Internal Medicine) Jerline Pain, MD as PCP - Cardiology (Cardiology) Debara Pickett Nadean Corwin, MD as Consulting Physician (Cardiology) Jorge Ny, LCSW as Social Worker (Licensed Clinical Social Worker) Luretha Rued, RN as [place] Management  Patient Active Problem List   Diagnosis Date Noted  . Postmenopausal vaginal bleeding 01/04/2018  . Snoring 01/04/2018  . Acute on chronic combined systolic and diastolic CHF (congestive heart failure) ([place])   . CHF exacerbation ([place]) 10/16/2017  . Noncompliance 10/16/2017  . Acute on chronic congestive heart failure ([place])   . Influenza with respiratory manifestation flu a + 09/14/16 started on tamiflu 09/15/17 09/19/2017  . Thrombocytopenia ([place]) 09/14/2017  . Acute drug-induced gout of right foot   . Medication noncompliance due to cognitive impairment 09/06/2017  . Acute diastolic CHF (congestive heart failure) ([place]) 08/06/2017  . LGI bleed, likely hemorrhoids 08/06/2017  . Atrial fibrillation ([place]) 08/04/2017  . Paroxysmal atrial fibrillation ([place]) 02/08/2017  . Cardiomyopathy, ischemic 02/08/2017  . Dysphagia 02/08/2017  . CKD (chronic kidney disease), stage III ([place]) 01/30/2017  . Pain in shoulder 02/08/2016  . Breast pain, left 02/08/2016  . Bilateral arm numbness and tingling while sleeping 02/08/2016  . Painful lumpy left breast 09/23/2015  . Candidal intertrigo 02/11/2015  . S/P CABG x 2 11/14/2014  . Accelerated hypertension   . Cardiac arrest ([place]) 11/04/2014  . Left main coronary artery disease 11/04/2014  . Coronary artery disease due to lipid rich plaque   . Acute respiratory failure with hypoxemia ([place])   . Essential hypertension   . Atrial fibrillation with RVR ([place])   . Hypokalemia 10/30/2014  . Chronic diastolic CHF (congestive heart  failure) ([place]) 10/30/2014  . NSTEMI (non-ST elevated myocardial infarction) ([place])   . DOE (dyspnea on exertion)   . CAP (community acquired pneumonia) 10/29/2014    Current Outpatient Medications:  .  acetaminophen (TYLENOL) 500 MG tablet, Take 500 mg by mouth every 6 (six) hours as needed for moderate pain. , Disp: , Rfl:  .  allopurinol (ZYLOPRIM) 300 MG tablet, Take 1 tablet (300 mg total) by mouth daily., Disp: 30 tablet, Rfl: 11 .  atorvastatin (LIPITOR) 10 MG tablet, Take 10 mg by mouth daily., Disp: , Rfl:  .  carvedilol (COREG) 3.125 MG tablet, Take 1 tablet (3.125 mg total) by mouth 2 (two) times daily with a meal., Disp: 60 tablet, Rfl: 3 .  furosemide (LASIX) 20 MG tablet, TAKE 3 TABLETS (60 MG TOTAL) BY MOUTH 2 (TWO) TIMES DAILY., Disp: 180 tablet, Rfl: 11 .  losartan (COZAAR) 25 MG tablet, TAKE 1 TABLET BY MOUTH TWO TIMES DAILY., Disp: 180 tablet, Rfl: 1 .  magnesium oxide (MAG-OX) 400 MG tablet, Take 1 tablet (400 mg total) by mouth 3 (three) times daily., Disp: 270 tablet, Rfl: 3 .  pantoprazole (PROTONIX) 40 MG tablet, Take 40 mg by mouth daily., Disp: , Rfl:  .  rivaroxaban (XARELTO) 20 MG TABS tablet, Take 1 tablet (20 mg total) by mouth daily with supper., Disp: 30 tablet, Rfl: 11 .  spironolactone (ALDACTONE) 25 MG tablet, Take 1 tablet (25 mg total) by mouth daily., Disp: 30 tablet, Rfl: 6 Allergies  Allergen Reactions  . Bee Venom Anaphylaxis  . Codeine Nausea And Vomiting  . Shrimp [Shellfish Allergy] Swelling  .  Tomato     Pt states it gives her gout     Social History   Socioeconomic History  . Marital status: Widowed    Spouse name: Not on file  . Number of children: 1  . Years of education: 33  . Highest education level: Not on file  Occupational History  . Not on file  Social Needs  . Financial resource strain: Not hard at all  . Food insecurity    Worry: Never true    Inability: Never true  . Transportation needs    Medical: No    Non-medical:  No  Tobacco Use  . Smoking status: Former Smoker    Packs/day: 0.10    Years: 56.00    Pack years: 5.60    Types: Cigarettes    Quit date: 2019    Years since quitting: 1.7  . Smokeless tobacco: Never Used  Substance and Sexual Activity  . Alcohol use: No    Alcohol/week: 0.0 standard drinks  . Drug use: No  . Sexual activity: Not on file  Lifestyle  . Physical activity    Days per week: Not on file    Minutes per session: Not on file  . Stress: Not on file  Relationships  . Social Herbalist on phone: Not on file    Gets together: Not on file    Attends religious service: Not on file    Active member of club or organization: Not on file    Attends meetings of clubs or organizations: Not on file    Relationship status: Not on file  . Intimate partner violence    Fear of current or ex partner: Not on file    Emotionally abused: Not on file    Physically abused: Not on file    Forced sexual activity: Not on file  Other Topics Concern  . Not on file  Social History Narrative   Patient reports it being difficult to pay for everything due to outstanding medical bills from hospital stay last year but states she is able to keep up with basic expenses.  Patient does have some concerns with her house's condition due to a water leak- had her roof repaired last month but has some leaking in the front which has affected her porch.  Patient owns her own car and is able to drive herself but has some concerns about the reliability of her car- has gotten taxi to the clinic for appointment in the past when her car broke down.    Physical Exam Pulmonary:     Effort: Pulmonary effort is normal.     Breath sounds: No wheezing or rales.  Abdominal:     General: There is no distension.  Skin:    General: Skin is warm and dry.         Future Appointments  Date Time Provider [place]  09/10/2019 11:00 AM Luretha Rued, RN [facility] None     BP 118/70 (BP  Location: Left Arm, Patient Position: Sitting, Cuff Size: Large)   Temp 98 F (36.7 C)   Wt 232 lb (105.2 kg)   BMI 35.80 kg/m   Weight yesterday-didn't weigh Last visit weight-didn't weigh  ATF pt CAO x4 sitting in the living room with no complaints.  [facility] delivered [facility] pill packs yesterday.  All of her meds is in the pill packs; they're not in the order that she's used to seeing.  We discussed that and she  verbalized her understanding of the same. She was given xarelto samples until the correct dose comes in per her pharmacist.  She denies sob, chest pain and dizziness.    Medication ordered None  Araiyah Cumpton, EMT Paramedic [phone] 04/30/2019    ACTION: Home visit completed

## 2019-05-14 DIAGNOSIS — I4891 Unspecified atrial fibrillation: Secondary | ICD-10-CM | POA: Diagnosis not present

## 2019-05-14 DIAGNOSIS — I5020 Unspecified systolic (congestive) heart failure: Secondary | ICD-10-CM | POA: Diagnosis not present

## 2019-05-14 DIAGNOSIS — E7800 Pure hypercholesterolemia, unspecified: Secondary | ICD-10-CM | POA: Diagnosis not present

## 2019-05-14 DIAGNOSIS — I10 Essential (primary) hypertension: Secondary | ICD-10-CM | POA: Diagnosis not present

## 2019-05-14 DIAGNOSIS — N183 Chronic kidney disease, stage 3 (moderate): Secondary | ICD-10-CM | POA: Diagnosis not present

## 2019-05-14 DIAGNOSIS — I214 Non-ST elevation (NSTEMI) myocardial infarction: Secondary | ICD-10-CM | POA: Diagnosis not present

## 2019-05-14 DIAGNOSIS — M1990 Unspecified osteoarthritis, unspecified site: Secondary | ICD-10-CM | POA: Diagnosis not present

## 2019-05-14 DIAGNOSIS — I509 Heart failure, unspecified: Secondary | ICD-10-CM | POA: Diagnosis not present

## 2019-05-14 DIAGNOSIS — I25810 Atherosclerosis of coronary artery bypass graft(s) without angina pectoris: Secondary | ICD-10-CM | POA: Diagnosis not present

## 2019-05-14 DIAGNOSIS — J449 Chronic obstructive pulmonary disease, unspecified: Secondary | ICD-10-CM | POA: Diagnosis not present

## 2019-05-14 DIAGNOSIS — M8580 Other specified disorders of bone density and structure, unspecified site: Secondary | ICD-10-CM | POA: Diagnosis not present

## 2019-05-22 ENCOUNTER — Other Ambulatory Visit (HOSPITAL_COMMUNITY): Payer: Self-pay

## 2019-05-22 NOTE — Progress Notes (Signed)
Paramedicine Encounter    Patient ID: [name] [name], female    DOB: 1946/05/15, 73 y.o.   MRN: [id number]    Patient Care Team: [name], MD as PCP - General (Internal Medicine) Jerline Pain, MD as PCP - Cardiology (Cardiology) Debara Pickett Nadean Corwin, MD as Consulting Physician (Cardiology) Jorge Ny, LCSW as Social Worker (Licensed Clinical Social Worker) Luretha Rued, RN as [place] Management  Patient Active Problem List   Diagnosis Date Noted  . Postmenopausal vaginal bleeding 01/04/2018  . Snoring 01/04/2018  . Acute on chronic combined systolic and diastolic CHF (congestive heart failure) ([place])   . CHF exacerbation ([place]) 10/16/2017  . Noncompliance 10/16/2017  . Acute on chronic congestive heart failure ([place])   . Influenza with respiratory manifestation flu a + 09/14/16 started on tamiflu 09/15/17 09/19/2017  . Thrombocytopenia ([place]) 09/14/2017  . Acute drug-induced gout of right foot   . Medication noncompliance due to cognitive impairment 09/06/2017  . Acute diastolic CHF (congestive heart failure) ([place]) 08/06/2017  . LGI bleed, likely hemorrhoids 08/06/2017  . Atrial fibrillation ([place]) 08/04/2017  . Paroxysmal atrial fibrillation ([place]) 02/08/2017  . Cardiomyopathy, ischemic 02/08/2017  . Dysphagia 02/08/2017  . CKD (chronic kidney disease), stage III 01/30/2017  . Pain in shoulder 02/08/2016  . Breast pain, left 02/08/2016  . Bilateral arm numbness and tingling while sleeping 02/08/2016  . Painful lumpy left breast 09/23/2015  . Candidal intertrigo 02/11/2015  . S/P CABG x 2 11/14/2014  . Accelerated hypertension   . Cardiac arrest ([place]) 11/04/2014  . Left main coronary artery disease 11/04/2014  . Coronary artery disease due to lipid rich plaque   . Acute respiratory failure with hypoxemia ([place])   . Essential hypertension   . Atrial fibrillation with RVR ([place])   . Hypokalemia 10/30/2014  . Chronic diastolic CHF (congestive heart  failure) ([place]) 10/30/2014  . NSTEMI (non-ST elevated myocardial infarction) ([place])   . DOE (dyspnea on exertion)   . CAP (community acquired pneumonia) 10/29/2014    Current Outpatient Medications:  .  acetaminophen (TYLENOL) 500 MG tablet, Take 500 mg by mouth every 6 (six) hours as needed for moderate pain. , Disp: , Rfl:  .  allopurinol (ZYLOPRIM) 300 MG tablet, Take 1 tablet (300 mg total) by mouth daily., Disp: 30 tablet, Rfl: 11 .  atorvastatin (LIPITOR) 10 MG tablet, Take 10 mg by mouth daily., Disp: , Rfl:  .  carvedilol (COREG) 3.125 MG tablet, Take 1 tablet (3.125 mg total) by mouth 2 (two) times daily with a meal., Disp: 60 tablet, Rfl: 3 .  furosemide (LASIX) 20 MG tablet, TAKE 3 TABLETS (60 MG TOTAL) BY MOUTH 2 (TWO) TIMES DAILY., Disp: 180 tablet, Rfl: 11 .  losartan (COZAAR) 25 MG tablet, TAKE 1 TABLET BY MOUTH TWO TIMES DAILY., Disp: 180 tablet, Rfl: 1 .  magnesium oxide (MAG-OX) 400 MG tablet, Take 1 tablet (400 mg total) by mouth 3 (three) times daily., Disp: 270 tablet, Rfl: 3 .  pantoprazole (PROTONIX) 40 MG tablet, Take 40 mg by mouth daily., Disp: , Rfl:  .  rivaroxaban (XARELTO) 20 MG TABS tablet, Take 1 tablet (20 mg total) by mouth daily with supper., Disp: 30 tablet, Rfl: 11 .  spironolactone (ALDACTONE) 25 MG tablet, Take 1 tablet (25 mg total) by mouth daily., Disp: 30 tablet, Rfl: 6 Allergies  Allergen Reactions  . Bee Venom Anaphylaxis  . Codeine Nausea And Vomiting  . Shrimp [Shellfish Allergy] Swelling  . Tomato  Pt states it gives her gout     Social History   Socioeconomic History  . Marital status: Widowed    Spouse name: Not on file  . Number of children: 1  . Years of education: 45  . Highest education level: Not on file  Occupational History  . Not on file  Social Needs  . Financial resource strain: Not hard at all  . Food insecurity    Worry: Never true    Inability: Never true  . Transportation needs    Medical: No    Non-medical:  No  Tobacco Use  . Smoking status: Former Smoker    Packs/day: 0.10    Years: 56.00    Pack years: 5.60    Types: Cigarettes    Quit date: 2019    Years since quitting: 1.7  . Smokeless tobacco: Never Used  Substance and Sexual Activity  . Alcohol use: No    Alcohol/week: 0.0 standard drinks  . Drug use: No  . Sexual activity: Not on file  Lifestyle  . Physical activity    Days per week: Not on file    Minutes per session: Not on file  . Stress: Not on file  Relationships  . Social Herbalist on phone: Not on file    Gets together: Not on file    Attends religious service: Not on file    Active member of club or organization: Not on file    Attends meetings of clubs or organizations: Not on file    Relationship status: Not on file  . Intimate partner violence    Fear of current or ex partner: Not on file    Emotionally abused: Not on file    Physically abused: Not on file    Forced sexual activity: Not on file  Other Topics Concern  . Not on file  Social History Narrative   Patient reports it being difficult to pay for everything due to outstanding medical bills from hospital stay last year but states she is able to keep up with basic expenses.  Patient does have some concerns with her house's condition due to a water leak- had her roof repaired last month but has some leaking in the front which has affected her porch.  Patient owns her own car and is able to drive herself but has some concerns about the reliability of her car- has gotten taxi to the clinic for appointment in the past when her car broke down.    Physical Exam Pulmonary:     Effort: Pulmonary effort is normal. No respiratory distress.     Breath sounds: No wheezing or rales.  Abdominal:     Palpations: Abdomen is soft.     Tenderness: There is no abdominal tenderness.  Skin:    General: Skin is warm and dry.         Future Appointments  Date Time Provider [place]  06/11/2019   2:00 PM Jerline Pain, MD [facility]  09/10/2019 11:00 AM Luretha Rued, RN [facility] None     BP 140/82 (BP Location: Left Arm, Patient Position: Sitting, Cuff Size: Large)   Pulse 71   Wt 233 lb (105.7 kg)   SpO2 98%   BMI 35.95 kg/m   Weight yesterday-didn't weigh Last visit weight-231  ATF pt CAO x4 sitting in the living room watching tv.  She said that she's just getting home from the "game room" and hasn't taken her medications  yet.  Since she's been out all day, she decided to take her "fluid medications" when she got home.  She's c/o "feeling like the bones above her diaphragm is rubbing against each other, which has been going on for months.  She denies sob, chest pain and dizziness.  She's still taking medications from the pill packs.  Mickel Baas from [employer] wanted to verify that Mrs. Romack is taking the correct dose of xarelto.  The medication assistance program denied her.  rx packs verified.  We agree next [facility] visit in two weeks.    Medication ordered: None  Mackey Varricchio, EMT Paramedic [phone] 05/22/2019    ACTION: Home visit completed

## 2019-05-31 ENCOUNTER — Other Ambulatory Visit (HOSPITAL_COMMUNITY): Payer: Self-pay

## 2019-05-31 NOTE — Progress Notes (Signed)
Paramedicine Encounter    Patient ID: [name] [name], female    DOB: Sep 25, 1945, 73 y.o.   MRN: [id number]    Patient Care Team: [name], MD as PCP - General (Internal Medicine) Jerline Pain, MD as PCP - Cardiology (Cardiology) Debara Pickett Nadean Corwin, MD as Consulting Physician (Cardiology) Jorge Ny, LCSW as Social Worker (Licensed Clinical Social Worker) Luretha Rued, RN as [place] Management  Patient Active Problem List   Diagnosis Date Noted  . Postmenopausal vaginal bleeding 01/04/2018  . Snoring 01/04/2018  . Acute on chronic combined systolic and diastolic CHF (congestive heart failure) ([place])   . CHF exacerbation ([place]) 10/16/2017  . Noncompliance 10/16/2017  . Acute on chronic congestive heart failure ([place])   . Influenza with respiratory manifestation flu a + 09/14/16 started on tamiflu 09/15/17 09/19/2017  . Thrombocytopenia ([place]) 09/14/2017  . Acute drug-induced gout of right foot   . Medication noncompliance due to cognitive impairment 09/06/2017  . Acute diastolic CHF (congestive heart failure) ([place]) 08/06/2017  . LGI bleed, likely hemorrhoids 08/06/2017  . Atrial fibrillation ([place]) 08/04/2017  . Paroxysmal atrial fibrillation ([place]) 02/08/2017  . Cardiomyopathy, ischemic 02/08/2017  . Dysphagia 02/08/2017  . CKD (chronic kidney disease), stage III 01/30/2017  . Pain in shoulder 02/08/2016  . Breast pain, left 02/08/2016  . Bilateral arm numbness and tingling while sleeping 02/08/2016  . Painful lumpy left breast 09/23/2015  . Candidal intertrigo 02/11/2015  . S/P CABG x 2 11/14/2014  . Accelerated hypertension   . Cardiac arrest ([place]) 11/04/2014  . Left main coronary artery disease 11/04/2014  . Coronary artery disease due to lipid rich plaque   . Acute respiratory failure with hypoxemia ([place])   . Essential hypertension   . Atrial fibrillation with RVR ([place])   . Hypokalemia 10/30/2014  . Chronic diastolic CHF (congestive heart  failure) ([place]) 10/30/2014  . NSTEMI (non-ST elevated myocardial infarction) ([place])   . DOE (dyspnea on exertion)   . CAP (community acquired pneumonia) 10/29/2014    Current Outpatient Medications:  .  acetaminophen (TYLENOL) 500 MG tablet, Take 500 mg by mouth every 6 (six) hours as needed for moderate pain. , Disp: , Rfl:  .  allopurinol (ZYLOPRIM) 300 MG tablet, Take 1 tablet (300 mg total) by mouth daily., Disp: 30 tablet, Rfl: 11 .  atorvastatin (LIPITOR) 10 MG tablet, Take 10 mg by mouth daily., Disp: , Rfl:  .  carvedilol (COREG) 3.125 MG tablet, Take 1 tablet (3.125 mg total) by mouth 2 (two) times daily with a meal., Disp: 60 tablet, Rfl: 3 .  furosemide (LASIX) 20 MG tablet, TAKE 3 TABLETS (60 MG TOTAL) BY MOUTH 2 (TWO) TIMES DAILY., Disp: 180 tablet, Rfl: 11 .  losartan (COZAAR) 25 MG tablet, TAKE 1 TABLET BY MOUTH TWO TIMES DAILY., Disp: 180 tablet, Rfl: 1 .  magnesium oxide (MAG-OX) 400 MG tablet, Take 1 tablet (400 mg total) by mouth 3 (three) times daily., Disp: 270 tablet, Rfl: 3 .  pantoprazole (PROTONIX) 40 MG tablet, Take 40 mg by mouth daily., Disp: , Rfl:  .  rivaroxaban (XARELTO) 20 MG TABS tablet, Take 1 tablet (20 mg total) by mouth daily with supper., Disp: 30 tablet, Rfl: 11 .  spironolactone (ALDACTONE) 25 MG tablet, Take 1 tablet (25 mg total) by mouth daily., Disp: 30 tablet, Rfl: 6 Allergies  Allergen Reactions  . Bee Venom Anaphylaxis  . Codeine Nausea And Vomiting  . Shrimp [Shellfish Allergy] Swelling  . Tomato  Pt states it gives her gout     Social History   Socioeconomic History  . Marital status: Widowed    Spouse name: Not on file  . Number of children: 1  . Years of education: 60  . Highest education level: Not on file  Occupational History  . Not on file  Social Needs  . Financial resource strain: Not hard at all  . Food insecurity    Worry: Never true    Inability: Never true  . Transportation needs    Medical: No    Non-medical:  No  Tobacco Use  . Smoking status: Former Smoker    Packs/day: 0.10    Years: 56.00    Pack years: 5.60    Types: Cigarettes    Quit date: 2019    Years since quitting: 1.7  . Smokeless tobacco: Never Used  Substance and Sexual Activity  . Alcohol use: No    Alcohol/week: 0.0 standard drinks  . Drug use: No  . Sexual activity: Not on file  Lifestyle  . Physical activity    Days per week: Not on file    Minutes per session: Not on file  . Stress: Not on file  Relationships  . Social Herbalist on phone: Not on file    Gets together: Not on file    Attends religious service: Not on file    Active member of club or organization: Not on file    Attends meetings of clubs or organizations: Not on file    Relationship status: Not on file  . Intimate partner violence    Fear of current or ex partner: Not on file    Emotionally abused: Not on file    Physically abused: Not on file    Forced sexual activity: Not on file  Other Topics Concern  . Not on file  Social History Narrative   Patient reports it being difficult to pay for everything due to outstanding medical bills from hospital stay last year but states she is able to keep up with basic expenses.  Patient does have some concerns with her house's condition due to a water leak- had her roof repaired last month but has some leaking in the front which has affected her porch.  Patient owns her own car and is able to drive herself but has some concerns about the reliability of her car- has gotten taxi to the clinic for appointment in the past when her car broke down.    Physical Exam      Future Appointments  Date Time Provider [place]  06/11/2019  2:00 PM Jerline Pain, MD [facility]  09/10/2019 11:00 AM Luretha Rued, RN [facility] None     BP 136/88 (BP Location: Left Arm, Patient Position: Sitting, Cuff Size: Normal)   Temp 97.9 F (36.6 C)   Weight yesterday-didn't weigh Last  visit weight-233  ATF pt CAO x4 sitting in the living room. She called and stated that her brother had passed away last week and she had fluttering in her chest. She said that "it could be the pain that she felt before but she want to make sure there's nothing else going on". She's taken all of her medications.  She denies chest pain at this moment, she also denies sob and dizziness. 12 lead was NSR; her vitals within normal limits.  Pt's bubble pill packs verified.     Medication ordered: Bubble pill packs will be mailed  today  Ruthia Person, EMT Paramedic [phone] 05/31/2019    ACTION: Home visit completed

## 2019-06-03 DIAGNOSIS — I10 Essential (primary) hypertension: Secondary | ICD-10-CM | POA: Diagnosis not present

## 2019-06-03 DIAGNOSIS — I4891 Unspecified atrial fibrillation: Secondary | ICD-10-CM | POA: Diagnosis not present

## 2019-06-03 DIAGNOSIS — M1990 Unspecified osteoarthritis, unspecified site: Secondary | ICD-10-CM | POA: Diagnosis not present

## 2019-06-03 DIAGNOSIS — N1831 Chronic kidney disease, stage 3a: Secondary | ICD-10-CM | POA: Diagnosis not present

## 2019-06-03 DIAGNOSIS — E7800 Pure hypercholesterolemia, unspecified: Secondary | ICD-10-CM | POA: Diagnosis not present

## 2019-06-03 DIAGNOSIS — I214 Non-ST elevation (NSTEMI) myocardial infarction: Secondary | ICD-10-CM | POA: Diagnosis not present

## 2019-06-03 DIAGNOSIS — I25810 Atherosclerosis of coronary artery bypass graft(s) without angina pectoris: Secondary | ICD-10-CM | POA: Diagnosis not present

## 2019-06-03 DIAGNOSIS — M8580 Other specified disorders of bone density and structure, unspecified site: Secondary | ICD-10-CM | POA: Diagnosis not present

## 2019-06-03 DIAGNOSIS — J449 Chronic obstructive pulmonary disease, unspecified: Secondary | ICD-10-CM | POA: Diagnosis not present

## 2019-06-03 DIAGNOSIS — I509 Heart failure, unspecified: Secondary | ICD-10-CM | POA: Diagnosis not present

## 2019-06-03 DIAGNOSIS — I5020 Unspecified systolic (congestive) heart failure: Secondary | ICD-10-CM | POA: Diagnosis not present

## 2019-06-07 ENCOUNTER — Other Ambulatory Visit (HOSPITAL_COMMUNITY): Payer: Self-pay

## 2019-06-07 NOTE — Progress Notes (Signed)
Paramedicine Encounter    Patient ID: [name] [name], female    DOB: 04-Nov-1945, 73 y.o.   MRN: [id number]    Patient Care Team: [name], MD as PCP - General (Internal Medicine) Jerline Pain, MD as PCP - Cardiology (Cardiology) Debara Pickett Nadean Corwin, MD as Consulting Physician (Cardiology) Jorge Ny, LCSW as Social Worker (Licensed Clinical Social Worker) Luretha Rued, RN as [place] Management  Patient Active Problem List   Diagnosis Date Noted  . Postmenopausal vaginal bleeding 01/04/2018  . Snoring 01/04/2018  . Acute on chronic combined systolic and diastolic CHF (congestive heart failure) ([place])   . CHF exacerbation ([place]) 10/16/2017  . Noncompliance 10/16/2017  . Acute on chronic congestive heart failure ([place])   . Influenza with respiratory manifestation flu a + 09/14/16 started on tamiflu 09/15/17 09/19/2017  . Thrombocytopenia ([place]) 09/14/2017  . Acute drug-induced gout of right foot   . Medication noncompliance due to cognitive impairment 09/06/2017  . Acute diastolic CHF (congestive heart failure) ([place]) 08/06/2017  . LGI bleed, likely hemorrhoids 08/06/2017  . Atrial fibrillation ([place]) 08/04/2017  . Paroxysmal atrial fibrillation ([place]) 02/08/2017  . Cardiomyopathy, ischemic 02/08/2017  . Dysphagia 02/08/2017  . CKD (chronic kidney disease), stage III 01/30/2017  . Pain in shoulder 02/08/2016  . Breast pain, left 02/08/2016  . Bilateral arm numbness and tingling while sleeping 02/08/2016  . Painful lumpy left breast 09/23/2015  . Candidal intertrigo 02/11/2015  . S/P CABG x 2 11/14/2014  . Accelerated hypertension   . Cardiac arrest ([place]) 11/04/2014  . Left main coronary artery disease 11/04/2014  . Coronary artery disease due to lipid rich plaque   . Acute respiratory failure with hypoxemia ([place])   . Essential hypertension   . Atrial fibrillation with RVR ([place])   . Hypokalemia 10/30/2014  . Chronic diastolic CHF (congestive heart  failure) ([place]) 10/30/2014  . NSTEMI (non-ST elevated myocardial infarction) ([place])   . DOE (dyspnea on exertion)   . CAP (community acquired pneumonia) 10/29/2014    Current Outpatient Medications:  .  acetaminophen (TYLENOL) 500 MG tablet, Take 500 mg by mouth every 6 (six) hours as needed for moderate pain. , Disp: , Rfl:  .  allopurinol (ZYLOPRIM) 300 MG tablet, Take 1 tablet (300 mg total) by mouth daily., Disp: 30 tablet, Rfl: 11 .  atorvastatin (LIPITOR) 10 MG tablet, Take 10 mg by mouth daily., Disp: , Rfl:  .  carvedilol (COREG) 3.125 MG tablet, Take 1 tablet (3.125 mg total) by mouth 2 (two) times daily with a meal., Disp: 60 tablet, Rfl: 3 .  furosemide (LASIX) 20 MG tablet, TAKE 3 TABLETS (60 MG TOTAL) BY MOUTH 2 (TWO) TIMES DAILY., Disp: 180 tablet, Rfl: 11 .  losartan (COZAAR) 25 MG tablet, TAKE 1 TABLET BY MOUTH TWO TIMES DAILY., Disp: 180 tablet, Rfl: 1 .  magnesium oxide (MAG-OX) 400 MG tablet, Take 1 tablet (400 mg total) by mouth 3 (three) times daily., Disp: 270 tablet, Rfl: 3 .  pantoprazole (PROTONIX) 40 MG tablet, Take 40 mg by mouth daily., Disp: , Rfl:  .  rivaroxaban (XARELTO) 20 MG TABS tablet, Take 1 tablet (20 mg total) by mouth daily with supper., Disp: 30 tablet, Rfl: 11 .  spironolactone (ALDACTONE) 25 MG tablet, Take 1 tablet (25 mg total) by mouth daily., Disp: 30 tablet, Rfl: 6 Allergies  Allergen Reactions  . Bee Venom Anaphylaxis  . Codeine Nausea And Vomiting  . Shrimp [Shellfish Allergy] Swelling  . Tomato  Pt states it gives her gout     Social History   Socioeconomic History  . Marital status: Widowed    Spouse name: Not on file  . Number of children: 1  . Years of education: 71  . Highest education level: Not on file  Occupational History  . Not on file  Social Needs  . Financial resource strain: Not hard at all  . Food insecurity    Worry: Never true    Inability: Never true  . Transportation needs    Medical: No    Non-medical:  No  Tobacco Use  . Smoking status: Former Smoker    Packs/day: 0.10    Years: 56.00    Pack years: 5.60    Types: Cigarettes    Quit date: 2019    Years since quitting: 1.8  . Smokeless tobacco: Never Used  Substance and Sexual Activity  . Alcohol use: No    Alcohol/week: 0.0 standard drinks  . Drug use: No  . Sexual activity: Not on file  Lifestyle  . Physical activity    Days per week: Not on file    Minutes per session: Not on file  . Stress: Not on file  Relationships  . Social Herbalist on phone: Not on file    Gets together: Not on file    Attends religious service: Not on file    Active member of club or organization: Not on file    Attends meetings of clubs or organizations: Not on file    Relationship status: Not on file  . Intimate partner violence    Fear of current or ex partner: Not on file    Emotionally abused: Not on file    Physically abused: Not on file    Forced sexual activity: Not on file  Other Topics Concern  . Not on file  Social History Narrative   Patient reports it being difficult to pay for everything due to outstanding medical bills from hospital stay last year but states she is able to keep up with basic expenses.  Patient does have some concerns with her house's condition due to a water leak- had her roof repaired last month but has some leaking in the front which has affected her porch.  Patient owns her own car and is able to drive herself but has some concerns about the reliability of her car- has gotten taxi to the clinic for appointment in the past when her car broke down.    Physical Exam Pulmonary:     Effort: Pulmonary effort is normal. No respiratory distress.     Breath sounds: No wheezing.  Musculoskeletal:        General: No swelling.  Skin:    General: Skin is warm and dry.         Future Appointments  Date Time Provider [place]  06/11/2019  2:00 PM Jerline Pain, MD [facility]   09/10/2019 11:00 AM Luretha Rued, RN [facility] None     BP 110/76 (BP Location: Right Arm, Patient Position: Sitting, Cuff Size: Large)   Pulse 78   Temp 97.8 F (36.6 C)   Wt 232 lb 3.2 oz (105.3 kg)   SpO2 97%   BMI 35.83 kg/m   Weight yesterday-didn't weigh Last visit weight-234  ATF pt CAO x4 c/o chest wall pain per her norm.  She denies sob, chest pain and dizziness.  The pain increases with movement and palpation; it comes  and goes depending on her position.  She said that she's taken all of her medications since our last visit. She said that she' no longer a patient of Dr. Aundra Dubin and that she switched to [name].  I was unable to see her pill packs during this visit.     Medication ordered: none  Xiomara Sevillano, EMT Paramedic [phone] 06/07/2019    ACTION: Home visit completed

## 2019-06-10 ENCOUNTER — Encounter: Payer: Self-pay | Admitting: Cardiology

## 2019-06-11 ENCOUNTER — Ambulatory Visit (INDEPENDENT_AMBULATORY_CARE_PROVIDER_SITE_OTHER): Payer: PPO | Admitting: Cardiology

## 2019-06-11 ENCOUNTER — Ambulatory Visit
Admission: RE | Admit: 2019-06-11 | Discharge: 2019-06-11 | Disposition: A | Discharge status: Home / Self Care | Payer: PPO | Source: Ambulatory Visit | Attending: Cardiology | Admitting: Cardiology

## 2019-06-11 ENCOUNTER — Other Ambulatory Visit: Payer: Self-pay

## 2019-06-11 ENCOUNTER — Encounter: Payer: Self-pay | Admitting: Cardiology

## 2019-06-11 VITALS — BP 142/70 | HR 52 | Ht 67.5 in | Wt 231.0 lb

## 2019-06-11 DIAGNOSIS — I5022 Chronic systolic (congestive) heart failure: Secondary | ICD-10-CM

## 2019-06-11 DIAGNOSIS — I480 Paroxysmal atrial fibrillation: Secondary | ICD-10-CM | POA: Diagnosis not present

## 2019-06-11 DIAGNOSIS — R079 Chest pain, unspecified: Secondary | ICD-10-CM | POA: Diagnosis not present

## 2019-06-11 DIAGNOSIS — Z951 Presence of aortocoronary bypass graft: Secondary | ICD-10-CM

## 2019-06-11 DIAGNOSIS — R0789 Other chest pain: Secondary | ICD-10-CM

## 2019-06-11 NOTE — Progress Notes (Signed)
Cardiology Office Note:    Date:  06/11/2019   ID:  [name] [name], [name] [name] 01-Oct-1945, MRN [id number]  PCP:  [name], MD  Cardiologist:  [name], MD  Electrophysiologist:  None   Referring MD: [name], MD     History of Present Illness:    [name] [name] is a 73 y.o. female with coronary artery disease status post CABG x2 LIMA to LAD SVG to OM in [id number] with systolic heart failure ischemic cardiomyopathy tobacco use paroxysmal atrial fibrillation.  Previously due to medication noncompliance she was found not to be a candidate for ablation or antiarrhythmics.  However, she was rehospitalized with atrial fibrillation and started on amiodarone, Entresto.  Underwent successful cardioversion on 12/25/2017.  Nuclear stress test previous showed no ischemia.  Did have some pain epigastric. Better after bathroom.   Echo in 3/16 EF 35-40%. Echo in 10/17 EF improved to 50-55%.  F/u echo 5/18 EF stable 50-55% Echo 10/19/17 LVEF 30-35%, Moderate LVH, Mild AI, Moderate regurgitation, Severe LAE, Moderately reduced RV, Severe RAE, PA peak pressure 50 mm Hg. (Decreased from Echo 01/11/2017 with LVEF 50-55%) Nuclear stress test 09/07/2018-no ischemia, personally reviewed, EF 44%   Past Medical History:  Diagnosis Date  . A-fib (HCC)    RVR- POST CABG- RESOLVED. S/P NSTEMI 3/16  . Acute respiratory failure ([place]) 08/2017  . Arthritis   . Asthma    ??  . Atopic dermatitis   . CAD (coronary artery disease)    a. s/p CABG 2016.  . Cardiac arrest ([employer] [place] [employer]) 10/2014   [name]  . Cardiomyopathy, ischemic    a. EF previously low, improved to LVEF 50-55% as of May 2018  . Chronic combined systolic and diastolic heart failure ([place])   . CKD (chronic kidney disease), stage III   . COPD (chronic obstructive pulmonary disease) ([place])   . Esophageal dilatation 2013  . GERD (gastroesophageal reflux disease)   . Gout   . Headache   . Hypertension   . Hypokalemia   . Idiopathic angioedema    . LGI bleed 08/06/2017   a. felt to be hemorrhoidal during that admission (no drop in Hgb).  . Lower back pain   . Persistent atrial fibrillation (HCC)    a. h/o difficult to control rates (complicated by noncompliance), not felt to be a candidate for ablation or antiarrhythmic due to noncompliance.  . Personal history of noncompliance with medical treatment, presenting hazards to health   . Prediabetes   . S/P CABG x 2 with clipping of LA appendage 11/14/2014   LIMA to LAD, SVG to OM, EVH via right thigh  . Urine incontinence   . Uterine fibroid     Past Surgical History:  Procedure Laterality Date  . CARDIOVERSION N/A 11/18/2014   Procedure: CARDIOVERSION;  Surgeon: [name], MD;  Location: [facility];  Service: Cardiovascular;  Laterality: N/A;  . CARDIOVERSION N/A 12/25/2017   Procedure: CARDIOVERSION;  Surgeon: [name], MD;  Location: [place];  Service: Cardiovascular;  Laterality: N/A;  . CLIPPING OF ATRIAL APPENDAGE N/A 11/14/2014   Procedure: CLIPPING OF ATRIAL APPENDAGE;  Surgeon: [name], MD;  Location: [facility];  Service: Open Heart Surgery;  Laterality: N/A;  . COLONOSCOPY  2013  . CORONARY ARTERY BYPASS GRAFT N/A 11/14/2014   Procedure: CORONARY ARTERY BYPASS GRAFTING (CABG)TIMES 2 USING LEFT INTERNAL MAMMARY ARTERY AND RIGHT SAPHENOUS VEIN HARVESTED ENDOSCOPICALLY;  Surgeon: [name], MD;  Location: [facility];  Service: Open  Heart Surgery;  Laterality: N/A;  . LEFT HEART CATHETERIZATION WITH CORONARY ANGIOGRAM N/A 11/04/2014   Procedure: LEFT HEART CATHETERIZATION WITH CORONARY ANGIOGRAM;  Surgeon: Troy Sine, MD;  Location[facility] CATH LAB;  Service: Cardiovascular;  Laterality: N/A;  . TEE WITHOUT CARDIOVERSION N/A 11/14/2014   Procedure: TRANSESOPHAGEAL ECHOCARDIOGRAM (TEE);  Surgeon: [name], MD;  Location: [place];  Service: Open Heart Surgery;  Laterality: N/A;  . TEE WITHOUT CARDIOVERSION N/A 12/25/2017   Procedure: TRANSESOPHAGEAL ECHOCARDIOGRAM  (TEE);  Surgeon: [name], MD;  Location[facility] ENDOSCOPY;  Service: Cardiovascular;  Laterality: N/A;  . TEMPORARY PACEMAKER INSERTION  11/04/2014   Procedure: TEMPORARY PACEMAKER INSERTION;  Surgeon: Troy Sine, MD;  Location: [facility] CATH LAB;  Service: Cardiovascular;;  . TOOTH EXTRACTION      Current Medications: Current Meds  Medication Sig  . acetaminophen (TYLENOL) 500 MG tablet Take 500 mg by mouth every 6 (six) hours as needed for moderate pain.   Marland Kitchen allopurinol (ZYLOPRIM) 300 MG tablet Take 1 tablet (300 mg total) by mouth daily.  Marland Kitchen atorvastatin (LIPITOR) 10 MG tablet Take 10 mg by mouth daily.  . carvedilol (COREG) 3.125 MG tablet Take 1 tablet (3.125 mg total) by mouth 2 (two) times daily with a meal.  . furosemide (LASIX) 20 MG tablet TAKE 3 TABLETS (60 MG TOTAL) BY MOUTH 2 (TWO) TIMES DAILY.  Marland Kitchen losartan (COZAAR) 25 MG tablet TAKE 1 TABLET BY MOUTH TWO TIMES DAILY.  . magnesium oxide (MAG-OX) 400 MG tablet Take 1 tablet (400 mg total) by mouth 3 (three) times daily.  . pantoprazole (PROTONIX) 40 MG tablet Take 40 mg by mouth daily.  . rivaroxaban (XARELTO) 20 MG TABS tablet Take 1 tablet (20 mg total) by mouth daily with supper.  Marland Kitchen spironolactone (ALDACTONE) 25 MG tablet Take 1 tablet (25 mg total) by mouth daily.     Allergies:   Bee venom, Codeine, Shrimp [shellfish allergy], and Tomato   Social History   Socioeconomic History  . Marital status: Widowed    Spouse name: Not on file  . Number of children: 1  . Years of education: 11  . Highest education level: Not on file  Occupational History  . Occupation: RETIRED [employer]  Social Needs  . Financial resource strain: Not hard at all  . Food insecurity    Worry: Never true    Inability: Never true  . Transportation needs    Medical: No    Non-medical: No  Tobacco Use  . Smoking status: Former Smoker    Packs/day: 0.10    Years: 56.00    Pack years: 5.60    Types: Cigarettes    Quit date:  2019    Years since quitting: 1.8  . Smokeless tobacco: Never Used  Substance and Sexual Activity  . Alcohol use: No    Alcohol/week: 0.0 standard drinks  . Drug use: No  . Sexual activity: Not on file  Lifestyle  . Physical activity    Days per week: Not on file    Minutes per session: Not on file  . Stress: Not on file  Relationships  . Social Herbalist on phone: Not on file    Gets together: Not on file    Attends religious service: Not on file    Active member of club or organization: Not on file    Attends meetings of clubs or organizations: Not on file    Relationship status: Not on  file  Other Topics Concern  . Not on file  Social History Narrative   Patient reports it being difficult to pay for everything due to outstanding medical bills from hospital stay last year but states she is able to keep up with basic expenses.  Patient does have some concerns with her house's condition due to a water leak- had her roof repaired last month but has some leaking in the front which has affected her porch.  Patient owns her own car and is able to drive herself but has some concerns about the reliability of her car- has gotten taxi to the clinic for appointment in the past when her car broke down.     Family History: [name] family history includes CVA in her sister; Cancer in her mother; Diabetes in her brother; Heart disease in her father; Other in her father; Prostate cancer (age of onset: 48) in her brother.  ROS:   Please see the history of present illness.     All other systems reviewed and are negative.  EKGs/Labs/Other Studies Reviewed:    The following studies were reviewed today: Prior nuclear stress test reassuring  EKG:  None today  Recent Labs: 08/03/2018: B Natriuretic Peptide 30.5; BUN 28; Creatinine, Ser 1.50; Potassium 4.0; Sodium 140  Recent Lipid Panel    Component Value Date/Time   CHOL 116 11/12/2014 0432   TRIG 111 11/12/2014 0432    HDL 34 (L) 11/12/2014 0432   CHOLHDL 3.4 11/12/2014 0432   VLDL 22 11/12/2014 0432   LDLCALC 60 11/12/2014 0432    Physical Exam:    VS:  BP (!) 142/70   Pulse (!) 52   Ht 5' 7.5" (1.715 m)   Wt 231 lb (104.8 kg)   SpO2 97%   BMI 35.65 kg/m     Wt Readings from Last 3 Encounters:  06/11/19 231 lb (104.8 kg)  06/07/19 232 lb 3.2 oz (105.3 kg)  05/30/19 234 lb (106.1 kg)     GEN:  Well nourished, well developed in no acute distress HEENT: Normal NECK: No JVD; No carotid bruits LYMPHATICS: No lymphadenopathy CARDIAC: RRR, no murmurs, rubs, gallops RESPIRATORY:  Clear to auscultation without rales, wheezing or rhonchi  ABDOMEN: Soft, non-tender, non-distended MUSCULOSKELETAL:  No edema; No deformity  SKIN: Warm and dry, under the breast candida she showed me.  NEUROLOGIC:  Alert and oriented x 3 PSYCHIATRIC:  Normal affect   ASSESSMENT:    1. Atypical chest pain   2. Hx of CABG   3. Chronic systolic heart failure (HCC)   4. Paroxysmal atrial fibrillation (HCC)    PLAN:    In order of problems listed above:  Chest pain atypical -Continues to have discomfort.  We will check a chest x-ray.  Chest wall.  No shortness of breath.  Seems to increase with movement and palpation.  Sometimes when she lays down on her right side and moves back up she has the discomfort.  Thankfully, her stress test is negative. -Previously reviewed nuclear stress test from 08/2018 with no evidence of ischemia.  Post CABG.  EF 44%.  Sound like pain was musculoskeletal positional or perhaps GI related recommended that a PPI be administered at that time.    Combined systolic and diastolic heart failure -Previously seen by [name] with EF in the 30 to 35% range.  Nuclear stress test showed 44% calculation.  The paramedics have been visiting her house to help her with her heart failure medications. -She had  some bradycardia and was unable to tolerate Entresto because of chest pain.  Doing okay on  losartan.  Very stable currently.  Coronary artery disease -CABG 2016 -Nuclear stress test 08/2018 no ischemia  Paroxysmal atrial fibrillation -No longer on amiodarone or beta-blocker because of bradycardia -Xarelto  History of tobacco use -Stop smoking March 2019  Suspected sleep apnea -Cannot unfortunately afford sleep study.  Prior history of noncompliance and poor insight  We will see her back in 1 year.  Medication Adjustments/Labs and Tests Ordered: Current medicines are reviewed at length with the patient today.  Concerns regarding medicines are outlined above.  Orders Placed This Encounter  Procedures  . DG Chest 2 View   No orders of the defined types were placed in this encounter.   Patient Instructions  Medication Instructions:  The current medical regimen is effective;  continue present plan and medications.  *If you need a refill on your cardiac medications before your next appointment, please call your pharmacy*  Testing/Procedures: A chest x-ray takes a picture of the organs and structures inside the chest, including the heart, lungs, and blood vessels. This test can show several things, including, whether the heart is enlarges; whether fluid is building up in the lungs; and whether pacemaker / defibrillator leads are still in place.  Follow-Up: At [facility], you and your health needs are our priority.  As part of our continuing mission to provide you with exceptional heart care, we have created designated Provider Care Teams.  These Care Teams include your primary Cardiologist (physician) and Advanced Practice Providers (APPs -  Physician Assistants and Nurse Practitioners) who all work together to provide you with the care you need, when you need it.  Your next appointment:   12 months  The format for your next appointment:   In Person  Provider:   [name], MD  Thank you for choosing [facility]!!        Signed, [name],  MD  06/11/2019 3:12 PM    [place]

## 2019-06-11 NOTE — Patient Instructions (Signed)
Medication Instructions:  The current medical regimen is effective;  continue present plan and medications.  *If you need a refill on your cardiac medications before your next appointment, please call your pharmacy*  Testing/Procedures: A chest x-ray takes a picture of the organs and structures inside the chest, including the heart, lungs, and blood vessels. This test can show several things, including, whether the heart is enlarges; whether fluid is building up in the lungs; and whether pacemaker / defibrillator leads are still in place.  Follow-Up: At [facility], you and your health needs are our priority.  As part of our continuing mission to provide you with exceptional heart care, we have created designated Provider Care Teams.  These Care Teams include your primary Cardiologist (physician) and Advanced Practice Providers (APPs -  Physician Assistants and Nurse Practitioners) who all work together to provide you with the care you need, when you need it.  Your next appointment:   12 months  The format for your next appointment:   In Person  Provider:   [name], MD  Thank you for choosing [facility]!!

## 2019-06-13 ENCOUNTER — Telehealth (HOSPITAL_COMMUNITY): Payer: Self-pay

## 2019-06-13 NOTE — Telephone Encounter (Signed)
[name] [name] told me that she's no longer a patient of the [facility].  She has found another cardiologist.  I called to advise her that I can no longer provide her service through the [facility] program due to the same.  She voiced hat she understands.

## 2019-06-14 ENCOUNTER — Telehealth: Payer: Self-pay | Admitting: Cardiology

## 2019-06-14 NOTE — Telephone Encounter (Signed)
I spoke to the patient and informed her that I would forward concerns to [name]' nurse to f/u.  She verbalized understanding.

## 2019-06-14 NOTE — Telephone Encounter (Signed)
  Patient recently switched to [name] from [name] and she states she had a home health nurse that came to her home a few times a week. The nurse told her today that since she switched heart doctors she can no longer come out to see her. She would like to know if it would be possible for [name] to get her set up with the nurse. She does not know what program it was through.

## 2019-06-17 ENCOUNTER — Telehealth: Payer: Self-pay | Admitting: *Deleted

## 2019-06-17 NOTE — Telephone Encounter (Signed)
     Patient recently switched to [name] from [name] and she states she had a home health nurse that came to her home a few times a week. The nurse told her today that since she switched heart doctors she can no longer come out to see her. She would like to know if it would be possible for [name] to get her set up with the nurse. She does not know what program it was through.      Documentation     [name] [name], [name] [name] [id number]  [name] [name] S      Pt has been a patient of both [facility] and [facility] for sometime now.  Unsure as to why she is not eligable for patient outreach any longer.  The last note from 06/13/2019 was by [facility] Copper, EMT.  Will f/u with her to determine what criteria pt needs to meet to continue.

## 2019-06-18 NOTE — Telephone Encounter (Signed)
Good Morning RN Raul Del, Have you spoken with Mrs. Izzard? I think that Mrs. Copenhaver maybe a little confused.  I asked her during the visit if she'd been a patient of [name] and she said "no" and that she just set up an appointment to see him for that upcoming Monday. She then went on to say that she "doesn't want to continue seeing Dr. Aundra Dubin anymore because she's still having chest pain for about a year,".  I can continue to see her if she's a patient of either physician at the [facility]. I hope that this answers your question, Please feel free to call me @  [phone] anytime Tuesday- Friday.  [place]

## 2019-06-22 DIAGNOSIS — Z23 Encounter for immunization: Secondary | ICD-10-CM | POA: Diagnosis not present

## 2019-06-25 DIAGNOSIS — I5020 Unspecified systolic (congestive) heart failure: Secondary | ICD-10-CM | POA: Diagnosis not present

## 2019-06-25 DIAGNOSIS — I25810 Atherosclerosis of coronary artery bypass graft(s) without angina pectoris: Secondary | ICD-10-CM | POA: Diagnosis not present

## 2019-06-25 DIAGNOSIS — I214 Non-ST elevation (NSTEMI) myocardial infarction: Secondary | ICD-10-CM | POA: Diagnosis not present

## 2019-06-25 DIAGNOSIS — J449 Chronic obstructive pulmonary disease, unspecified: Secondary | ICD-10-CM | POA: Diagnosis not present

## 2019-06-25 DIAGNOSIS — M1990 Unspecified osteoarthritis, unspecified site: Secondary | ICD-10-CM | POA: Diagnosis not present

## 2019-06-25 DIAGNOSIS — M8580 Other specified disorders of bone density and structure, unspecified site: Secondary | ICD-10-CM | POA: Diagnosis not present

## 2019-06-25 DIAGNOSIS — I10 Essential (primary) hypertension: Secondary | ICD-10-CM | POA: Diagnosis not present

## 2019-06-25 DIAGNOSIS — E7800 Pure hypercholesterolemia, unspecified: Secondary | ICD-10-CM | POA: Diagnosis not present

## 2019-06-25 DIAGNOSIS — I509 Heart failure, unspecified: Secondary | ICD-10-CM | POA: Diagnosis not present

## 2019-06-25 DIAGNOSIS — I4891 Unspecified atrial fibrillation: Secondary | ICD-10-CM | POA: Diagnosis not present

## 2019-07-17 ENCOUNTER — Telehealth: Payer: Self-pay | Admitting: Cardiology

## 2019-07-17 ENCOUNTER — Other Ambulatory Visit (HOSPITAL_COMMUNITY): Payer: Self-pay | Admitting: *Deleted

## 2019-07-17 MED ORDER — SPIRONOLACTONE 25 MG PO TABS: 25.0000 mg | ORAL_TABLET | Freq: Every day | ORAL | 6 refills | Status: DC

## 2019-07-17 MED ORDER — LOSARTAN POTASSIUM 25 MG PO TABS: 25.0000 mg | ORAL_TABLET | Freq: Two times a day (BID) | ORAL | 1 refills | Status: DC

## 2019-07-17 NOTE — Telephone Encounter (Signed)
°*  STAT* If patient is at the pharmacy, call can be transferred to refill team.   1. Which medications need to be refilled? (please list name of each medication and dose if known) Spironolactone and Losartan   2. Which pharmacy/location (including street and city if local pharmacy) is medication to be sent to? [facility]- Fax# [phone]  3. Do they need a 30 day or 90 day supply?  90 days and refills      Patient calling the office for samples of medication:   1.  What medication and dosage are you requesting samples for? Xarelto 20 mg  2.  Are you currently out of this medication? yes

## 2019-07-17 NOTE — Telephone Encounter (Signed)
This is a CHF pt 

## 2019-07-23 ENCOUNTER — Telehealth (HOSPITAL_COMMUNITY): Payer: Self-pay

## 2019-07-23 NOTE — Telephone Encounter (Signed)
Received call from [facility] physicians staff Kathe Becton to schedule patient for an appt as she havent been seen in 1 year. Pt also in donut hole requesting samples of xarelto until end of year. As patient no longer under care in our office, message left on [place] vm that patient no longer seen here and pt has to refer to [name] for management.

## 2019-07-29 DIAGNOSIS — I4891 Unspecified atrial fibrillation: Secondary | ICD-10-CM | POA: Diagnosis not present

## 2019-07-29 DIAGNOSIS — M8580 Other specified disorders of bone density and structure, unspecified site: Secondary | ICD-10-CM | POA: Diagnosis not present

## 2019-07-29 DIAGNOSIS — I25810 Atherosclerosis of coronary artery bypass graft(s) without angina pectoris: Secondary | ICD-10-CM | POA: Diagnosis not present

## 2019-07-29 DIAGNOSIS — I509 Heart failure, unspecified: Secondary | ICD-10-CM | POA: Diagnosis not present

## 2019-07-29 DIAGNOSIS — J449 Chronic obstructive pulmonary disease, unspecified: Secondary | ICD-10-CM | POA: Diagnosis not present

## 2019-07-29 DIAGNOSIS — I10 Essential (primary) hypertension: Secondary | ICD-10-CM | POA: Diagnosis not present

## 2019-07-29 DIAGNOSIS — E7800 Pure hypercholesterolemia, unspecified: Secondary | ICD-10-CM | POA: Diagnosis not present

## 2019-07-29 DIAGNOSIS — I5020 Unspecified systolic (congestive) heart failure: Secondary | ICD-10-CM | POA: Diagnosis not present

## 2019-07-29 DIAGNOSIS — M1990 Unspecified osteoarthritis, unspecified site: Secondary | ICD-10-CM | POA: Diagnosis not present

## 2019-07-29 DIAGNOSIS — I214 Non-ST elevation (NSTEMI) myocardial infarction: Secondary | ICD-10-CM | POA: Diagnosis not present

## 2019-08-05 NOTE — Telephone Encounter (Signed)
Pt has a phone appt with [facility] thru [facility] outreach program 1/26.

## 2019-08-19 DIAGNOSIS — J449 Chronic obstructive pulmonary disease, unspecified: Secondary | ICD-10-CM | POA: Diagnosis not present

## 2019-08-19 DIAGNOSIS — I25810 Atherosclerosis of coronary artery bypass graft(s) without angina pectoris: Secondary | ICD-10-CM | POA: Diagnosis not present

## 2019-08-19 DIAGNOSIS — I214 Non-ST elevation (NSTEMI) myocardial infarction: Secondary | ICD-10-CM | POA: Diagnosis not present

## 2019-08-19 DIAGNOSIS — I509 Heart failure, unspecified: Secondary | ICD-10-CM | POA: Diagnosis not present

## 2019-08-19 DIAGNOSIS — E7800 Pure hypercholesterolemia, unspecified: Secondary | ICD-10-CM | POA: Diagnosis not present

## 2019-08-19 DIAGNOSIS — M1990 Unspecified osteoarthritis, unspecified site: Secondary | ICD-10-CM | POA: Diagnosis not present

## 2019-08-19 DIAGNOSIS — I4891 Unspecified atrial fibrillation: Secondary | ICD-10-CM | POA: Diagnosis not present

## 2019-08-19 DIAGNOSIS — I5020 Unspecified systolic (congestive) heart failure: Secondary | ICD-10-CM | POA: Diagnosis not present

## 2019-08-19 DIAGNOSIS — M8580 Other specified disorders of bone density and structure, unspecified site: Secondary | ICD-10-CM | POA: Diagnosis not present

## 2019-08-19 DIAGNOSIS — I10 Essential (primary) hypertension: Secondary | ICD-10-CM | POA: Diagnosis not present

## 2019-08-27 ENCOUNTER — Other Ambulatory Visit: Payer: Self-pay

## 2019-08-27 ENCOUNTER — Encounter (HOSPITAL_COMMUNITY): Payer: Self-pay

## 2019-08-27 ENCOUNTER — Ambulatory Visit (HOSPITAL_COMMUNITY)
Admission: RE | Admit: 2019-08-27 | Discharge: 2019-08-27 | Disposition: A | Discharge status: Home / Self Care | Payer: HMO | Source: Ambulatory Visit | Attending: Adult Health | Admitting: Adult Health

## 2019-08-27 VITALS — BP 128/76 | HR 78 | Wt 230.2 lb

## 2019-08-27 DIAGNOSIS — I5022 Chronic systolic (congestive) heart failure: Secondary | ICD-10-CM | POA: Diagnosis not present

## 2019-08-27 DIAGNOSIS — J449 Chronic obstructive pulmonary disease, unspecified: Secondary | ICD-10-CM | POA: Diagnosis not present

## 2019-08-27 DIAGNOSIS — Z885 Allergy status to narcotic agent status: Secondary | ICD-10-CM | POA: Diagnosis not present

## 2019-08-27 DIAGNOSIS — E785 Hyperlipidemia, unspecified: Secondary | ICD-10-CM | POA: Insufficient documentation

## 2019-08-27 DIAGNOSIS — Z87891 Personal history of nicotine dependence: Secondary | ICD-10-CM | POA: Insufficient documentation

## 2019-08-27 DIAGNOSIS — Z79899 Other long term (current) drug therapy: Secondary | ICD-10-CM | POA: Insufficient documentation

## 2019-08-27 DIAGNOSIS — I480 Paroxysmal atrial fibrillation: Secondary | ICD-10-CM | POA: Diagnosis not present

## 2019-08-27 DIAGNOSIS — I255 Ischemic cardiomyopathy: Secondary | ICD-10-CM | POA: Diagnosis not present

## 2019-08-27 DIAGNOSIS — R7303 Prediabetes: Secondary | ICD-10-CM | POA: Insufficient documentation

## 2019-08-27 DIAGNOSIS — K219 Gastro-esophageal reflux disease without esophagitis: Secondary | ICD-10-CM | POA: Insufficient documentation

## 2019-08-27 DIAGNOSIS — M109 Gout, unspecified: Secondary | ICD-10-CM | POA: Insufficient documentation

## 2019-08-27 DIAGNOSIS — I252 Old myocardial infarction: Secondary | ICD-10-CM | POA: Insufficient documentation

## 2019-08-27 DIAGNOSIS — I2510 Atherosclerotic heart disease of native coronary artery without angina pectoris: Secondary | ICD-10-CM | POA: Diagnosis not present

## 2019-08-27 DIAGNOSIS — I130 Hypertensive heart and chronic kidney disease with heart failure and stage 1 through stage 4 chronic kidney disease, or unspecified chronic kidney disease: Secondary | ICD-10-CM | POA: Insufficient documentation

## 2019-08-27 DIAGNOSIS — I5042 Chronic combined systolic (congestive) and diastolic (congestive) heart failure: Secondary | ICD-10-CM | POA: Diagnosis not present

## 2019-08-27 DIAGNOSIS — Z7901 Long term (current) use of anticoagulants: Secondary | ICD-10-CM | POA: Insufficient documentation

## 2019-08-27 DIAGNOSIS — Z8249 Family history of ischemic heart disease and other diseases of the circulatory system: Secondary | ICD-10-CM | POA: Insufficient documentation

## 2019-08-27 DIAGNOSIS — I5032 Chronic diastolic (congestive) heart failure: Secondary | ICD-10-CM

## 2019-08-27 DIAGNOSIS — N183 Chronic kidney disease, stage 3 unspecified: Secondary | ICD-10-CM | POA: Insufficient documentation

## 2019-08-27 DIAGNOSIS — Z833 Family history of diabetes mellitus: Secondary | ICD-10-CM | POA: Diagnosis not present

## 2019-08-27 DIAGNOSIS — Z951 Presence of aortocoronary bypass graft: Secondary | ICD-10-CM | POA: Insufficient documentation

## 2019-08-27 DIAGNOSIS — Z8674 Personal history of sudden cardiac arrest: Secondary | ICD-10-CM | POA: Diagnosis not present

## 2019-08-27 LAB — BASIC METABOLIC PANEL
Anion gap: 9 (ref 5–15)
BUN: 25 mg/dL — ABNORMAL HIGH (ref 8–23)
CO2: 21 mmol/L — ABNORMAL LOW (ref 22–32)
Calcium: 9.3 mg/dL (ref 8.9–10.3)
Chloride: 110 mmol/L (ref 98–111)
Creatinine, Ser: 1.45 mg/dL — ABNORMAL HIGH (ref 0.44–1.00)
GFR calc Af Amer: 41 mL/min — ABNORMAL LOW (ref >60)
GFR calc non Af Amer: 35 mL/min — ABNORMAL LOW (ref >60)
Glucose, Bld: 106 mg/dL — ABNORMAL HIGH (ref 70–99)
Potassium: 4.1 mmol/L (ref 3.5–5.1)
Sodium: 140 mmol/L (ref 135–145)

## 2019-08-27 NOTE — Progress Notes (Signed)
`  Advanced [facility] Failure Clinic  Note   PCP: [name], MD PCP-Cardiologist: [name], MD  HF: Dr Haroldine Laws   HPI: [name] [name] is a 74 y.o. female with h/o HTN, former tobacco abuse, PAF, CAD s/p CABG x 2 (LIMA to LAD and SVG to OM) in 2016, Chronic combined CHF, Ischemic CMP, HLD, and DOE.   She was initially noted to be in atrial fibrillation in 2016 with a reduced LVEF, leading to ischemic workup with a cardiac cath showing severe multivessel CAD with LM involvement in which she underwent CABG x2 utilizing LIMA to LAD, SVG to OM.   Admitted 1/19 for AF with RVR in setting of Influenza A. During that hospitalization, she was started on diltiazem drip, EP was consulted to discuss plans for Tikosyn vs. ablation vs. medication rate control however, due to her known medication noncompliance she was found to not be an ablation nor antiarrhythmic candidate  Pt admitted 3/3 - 10/20/17 with recurrent Afib RVR and ADHF. Started on diltiazem. Echo repeated which showed fall in EF from previous. Diuresed with IV lasix with 5L negative for admission. Discharged home on diltiazem 360, Torpol 100, digoxin and Xarelto.  Seen by Dr. Debara Pickett 11/20/17, was feeling well overall. Of note, she reported not remembering her recent hospitalization. She was also noted to be out of her metoprolol for approximately a month.   She was found to be in Afib. She was started on amiodarone and diltiazem was DCd. She was also started on Entresto. She underwent successful atrial flutter TEE/DCCV on 12/25/17. She was back in rate controlled afib on 5/16 during paramedic visit.  In May Toprol XL was cut back to 50 mg per day due to bradycardia. Toprol DC'd with ongoing bradycardia. She later placed on low dose carvedilol.   Intolerant entresto due to chest pain.   Today she returns for HF follow up. She has not been seen in the HF clinic in over a year and she is interested in re-establishing in HF clinic. Says she had  chest discomfort last night after she today groceries in the house. She is isolate the spot on her chest. She thinks it was from muscular pain.  Overall feeling fine.Says she is out her house most days shopping.  Denies SOB/PND/Orthopnea. Appetite ok. No fever or chills. Weight at home 223-225 pounds. Taking all medications. Lives alone.   Echo in 3/16 EF 35-40%. Echo in 10/17 EF improved to 50-55%.  Echo 5/18 EF stable 50-55% Echo 10/19/17 LVEF 30-35%, Moderate LVH, Mild AI, Moderate regurgitation, Severe LAE, Moderately reduced RV, Severe RAE, PA peak pressure 50 mm Hg.   (Decreased from Echo 01/11/2017 with LVEF 50-55%) Echo 08/03/2018 Ef 30-35% RV mildly dilated.   Stress Test 1/20- Low Risk EF 44%  Review of systems complete and found to be negative unless listed in HPI.   Past Medical History:  Diagnosis Date  . A-fib (HCC)    RVR- POST CABG- RESOLVED. S/P NSTEMI 3/16  . Acute respiratory failure ([place]) 08/2017  . Arthritis   . Asthma    ??  . Atopic dermatitis   . CAD (coronary artery disease)    a. s/p CABG 2016.  . Cardiac arrest ([place]) 10/2014   [name]  . Cardiomyopathy, ischemic    a. EF previously low, improved to LVEF 50-55% as of May 2018  . Chronic combined systolic and diastolic heart failure ([place])   . CKD (chronic kidney disease), stage III   .  COPD (chronic obstructive pulmonary disease) ([place])   . Esophageal dilatation 2013  . GERD (gastroesophageal reflux disease)   . Gout   . Headache   . Hypertension   . Hypokalemia   . Idiopathic angioedema   . LGI bleed 08/06/2017   a. felt to be hemorrhoidal during that admission (no drop in Hgb).  . Lower back pain   . Persistent atrial fibrillation (HCC)    a. h/o difficult to control rates (complicated by noncompliance), not felt to be a candidate for ablation or antiarrhythmic due to noncompliance.  . Personal history of noncompliance with medical treatment, presenting hazards to health   . Prediabetes   . S/P CABG  x 2 with clipping of LA appendage 11/14/2014   LIMA to LAD, SVG to OM, EVH via right thigh  . Urine incontinence   . Uterine fibroid    Current Outpatient Medications  Medication Sig Dispense Refill  . allopurinol (ZYLOPRIM) 300 MG tablet Take 1 tablet (300 mg total) by mouth daily. 30 tablet 11  . atorvastatin (LIPITOR) 10 MG tablet Take 10 mg by mouth daily.    . carvedilol (COREG) 3.125 MG tablet Take 1 tablet (3.125 mg total) by mouth 2 (two) times daily with a meal. 60 tablet 3  . furosemide (LASIX) 20 MG tablet TAKE 3 TABLETS (60 MG TOTAL) BY MOUTH 2 (TWO) TIMES DAILY. 180 tablet 11  . losartan (COZAAR) 25 MG tablet Take 1 tablet (25 mg total) by mouth 2 (two) times daily. 180 tablet 1  . pantoprazole (PROTONIX) 40 MG tablet Take 40 mg by mouth daily.    . rivaroxaban (XARELTO) 20 MG TABS tablet Take 1 tablet (20 mg total) by mouth daily with supper. 30 tablet 11  . spironolactone (ALDACTONE) 25 MG tablet Take 1 tablet (25 mg total) by mouth daily. 30 tablet 6  . acetaminophen (TYLENOL) 500 MG tablet Take 500 mg by mouth every 6 (six) hours as needed for moderate pain.     . magnesium oxide (MAG-OX) 400 MG tablet Take 1 tablet (400 mg total) by mouth 3 (three) times daily. (Patient not taking: Reported on 08/27/2019) 270 tablet 3   No current facility-administered medications for this encounter.   Allergies  Allergen Reactions  . Bee Venom Anaphylaxis  . Codeine Nausea And Vomiting  . Entresto [Sacubitril-Valsartan]     Chest pain   . Shrimp [Shellfish Allergy] Swelling  . Tomato     Pt states it gives her gout   Social History   Socioeconomic History  . Marital status: Widowed    Spouse name: Not on file  . Number of children: 1  . Years of education: 30  . Highest education level: Not on file  Occupational History  . Occupation: RETIRED [employer]  Tobacco Use  . Smoking status: Former Smoker    Packs/day: 0.10    Years: 56.00    Pack years: 5.60    Types:  Cigarettes    Quit date: 2019    Years since quitting: 2.0  . Smokeless tobacco: Never Used  Substance and Sexual Activity  . Alcohol use: No    Alcohol/week: 0.0 standard drinks  . Drug use: No  . Sexual activity: Not on file  Other Topics Concern  . Not on file  Social History Narrative   Patient reports it being difficult to pay for everything due to outstanding medical bills from hospital stay last year but states she is able to keep up with  basic expenses.  Patient does have some concerns with her house's condition due to a water leak- had her roof repaired last month but has some leaking in the front which has affected her porch.  Patient owns her own car and is able to drive herself but has some concerns about the reliability of her car- has gotten taxi to the clinic for appointment in the past when her car broke down.   Social Determinants of Health   Financial Resource Strain:   . Difficulty of Paying Living Expenses: Not on file  Food Insecurity:   . Worried About Charity fundraiser in the Last Year: Not on file  . Ran Out of Food in the Last Year: Not on file  Transportation Needs:   . Lack of Transportation (Medical): Not on file  . Lack of Transportation (Non-Medical): Not on file  Physical Activity:   . Days of Exercise per Week: Not on file  . Minutes of Exercise per Session: Not on file  Stress:   . Feeling of Stress : Not on file  Social Connections:   . Frequency of Communication with Friends and Family: Not on file  . Frequency of Social Gatherings with Friends and Family: Not on file  . Attends Religious Services: Not on file  . Active Member of Clubs or Organizations: Not on file  . Attends Archivist Meetings: Not on file  . Marital Status: Not on file  Intimate Partner Violence:   . Fear of Current or Ex-Partner: Not on file  . Emotionally Abused: Not on file  . Physically Abused: Not on file  . Sexually Abused: Not on file   Family History    Problem Relation Age of Onset  . Cancer Mother        LYMPHOMA  . Heart disease Father   . Other Father        TB  . CVA Sister   . Prostate cancer Brother 28  . Diabetes Brother    Vitals:   08/27/19 1209  BP: 128/76  Pulse: 78  SpO2: 99%  Weight: 104.4 kg (230 lb 3.2 oz)   Wt Readings from Last 3 Encounters:  08/27/19 104.4 kg (230 lb 3.2 oz)  06/11/19 104.8 kg (231 lb)  06/07/19 105.3 kg (232 lb 3.2 oz)    PHYSICAL EXAM: General:  Well appearing. No resp difficulty. Walked on the clinic  HEENT: normal Neck: supple. JVP 7--8. Carotids 2+ bilat; no bruits. No lymphadenopathy or thryomegaly appreciated. Cor: PMI nondisplaced. Regular rate & rhythm. No rubs, gallops or murmurs. Lungs: clear Abdomen: soft, nontender, nondistended. No hepatosplenomegaly. No bruits or masses. Good bowel sounds. Extremities: no cyanosis, clubbing, rash, RLE and LLE trace edema Neuro: alert & orientedx3, cranial nerves grossly intact. moves all 4 extremities w/o difficulty. Affect pleasant  EKG: Sinus Brady 57 bpm QRS 96 bpm   ASSESSMENT & PLAN: 1. Chronic combined CHF - Suspect etiology is combined Ischemic/NICM with Afib/RVR for nearly a year.   - Echo 10/19/17 LVEF 30-35%, Moderate LVH, Mild AI, Moderate regurgitation, Severe LAE, Moderately reduced RV, Severe RAE, PA peak pressure 50 mm Hg. (Decreased from Echo 01/11/2017 with LVEF 50-55%) - TEE 12/2017: EF 20-25%, RV mod to severely reduced. Set up repeat ECHO.  -NYHA II. Volume status stable.  Continue lasix 60 mg twice a day.  - Continue low dose carvedilol 3/125 mg twice a day.  - Continue losartan 25 mg twice a day. Intolerant entresto due to chest  pain.  - Continue spironolactone 25 mg daily.  2. CAD s/p CABG x 2 2016 (LIMA to LAD and SVG to OM) - Stress Test 08/2018 low risk. EF 44%.   - No chest pain.  - Continue statin. No ASA. On xarelto. - Continue allopurinol 300 mg daily. Recent study published (CARES trial) that showed an  increased risk of death with uloric in patients with CAD. No longer on uloric 3. PAF s/p TEE/DCCV on 12/25/17 -Maintaining NSR.  - CHA2DS2-VASc is at least 5.  - Continue xarelto.  4. HTN -Stable.  5. Tobacco Abuse - Stopped smoking 10/2017. 6. HLD - Per [facility]. No change.  7. Suspected OSA/Snoring - She had a sleep study in May but has not followed up for next test.   Check BMET  Follow up in 3 months with Dr Haroldine Laws.   [name], NP 08/27/19

## 2019-08-27 NOTE — Patient Instructions (Signed)
Lab work done today. We will notify you of any abnormal lab work. No news is good news!   EKG done today.  Your physician has requested that you have an echocardiogram. Echocardiography is a painless test that uses sound waves to create images of your heart. It provides your doctor with information about the size and shape of your heart and how well your heart's chambers and valves are working. This procedure takes approximately one hour. There are no restrictions for this procedure.  Please follow up with the [facility] in 3 months with an echocardiogram.  At the [facility], you and your health needs are our priority. As part of our continuing mission to provide you with exceptional heart care, we have created designated Provider Care Teams. These Care Teams include your primary Cardiologist (physician) and Advanced Practice Providers (APPs- Physician Assistants and Nurse Practitioners) who all work together to provide you with the care you need, when you need it.   You may see any of the following providers on your designated Care Team at your next follow up: Marland Kitchen Dr [name] . Dr Loralie Champagne . [name], NP . Lyda Jester, PA . Audry Riles, PharmD   Please be sure to bring in all your medications bottles to every appointment.

## 2019-09-10 ENCOUNTER — Other Ambulatory Visit: Payer: Self-pay

## 2019-09-10 NOTE — Patient Outreach (Signed)
  [place] [facility]) Care Management Chronic Special Needs Program    09/10/2019  Name: Kenicia, Sicking: [birth year]  MRN: [id number]   Ms. Elektra Mauthe is enrolled in a chronic special needs plan for Heart Failure. RNCM called to follow up, assist with completion of health risk assessment and update individualized care plan. No answer. HIPAA compliant message left.   Plan: Chronic care management coordinator will attempt outreach within 2-3 weeks.  Thea Silversmith, RN, MSN, [place] [phone]

## 2019-09-18 ENCOUNTER — Other Ambulatory Visit: Payer: Self-pay

## 2019-09-18 DIAGNOSIS — E7800 Pure hypercholesterolemia, unspecified: Secondary | ICD-10-CM | POA: Diagnosis not present

## 2019-09-18 DIAGNOSIS — M8580 Other specified disorders of bone density and structure, unspecified site: Secondary | ICD-10-CM | POA: Diagnosis not present

## 2019-09-18 DIAGNOSIS — I509 Heart failure, unspecified: Secondary | ICD-10-CM | POA: Diagnosis not present

## 2019-09-18 DIAGNOSIS — I5020 Unspecified systolic (congestive) heart failure: Secondary | ICD-10-CM | POA: Diagnosis not present

## 2019-09-18 DIAGNOSIS — I10 Essential (primary) hypertension: Secondary | ICD-10-CM | POA: Diagnosis not present

## 2019-09-18 DIAGNOSIS — I4891 Unspecified atrial fibrillation: Secondary | ICD-10-CM | POA: Diagnosis not present

## 2019-09-18 DIAGNOSIS — M1990 Unspecified osteoarthritis, unspecified site: Secondary | ICD-10-CM | POA: Diagnosis not present

## 2019-09-18 DIAGNOSIS — J449 Chronic obstructive pulmonary disease, unspecified: Secondary | ICD-10-CM | POA: Diagnosis not present

## 2019-09-18 DIAGNOSIS — I25810 Atherosclerosis of coronary artery bypass graft(s) without angina pectoris: Secondary | ICD-10-CM | POA: Diagnosis not present

## 2019-09-18 DIAGNOSIS — I214 Non-ST elevation (NSTEMI) myocardial infarction: Secondary | ICD-10-CM | POA: Diagnosis not present

## 2019-09-18 NOTE — Patient Outreach (Signed)
  [facility]) Care Management Chronic Special Needs Program    09/18/2019  Name: Kyllie, Leas: 06-Apr-1946  MRN: [id number]   Ms. Brileigh Kramarz is enrolled in a chronic special needs plan for Heart Failure. RNCM called to complete health risk assessment and update individualized care plan. No answer. HIPAA compliant message left.   Plan: Chronic care management coordinator will attempt outreach within 1-2 weeks.  Thea Silversmith, RN, MSN, [place] [phone]

## 2019-09-24 ENCOUNTER — Other Ambulatory Visit: Payer: Self-pay

## 2019-09-24 NOTE — Patient Outreach (Signed)
  [facility]) Care Management Chronic Special Needs Program  09/24/2019  Name: [name] [name] DOB: June 09, 1946  MRN: [id number]  [name] [name] is enrolled in a chronic special needs plan for  Heart Failure. A completed health risk assessment has not been received from the client and client has not responded to outreach attempts by their health care concierge. RNCM has unsuccessfully attempted to contact client x3.  The client's individualized care plan was updated/developed based on available data.   Primary care provider Dr. [name] Cardiologist: Dr. [name] Upcoming appointment with Advanced heart failure clinic 12/02/2019  Plan:  . Send unsuccessful outreach letter with a copy of individualized care plan to client . Send individualized care plan to provider . Send educational material  Chronic care management coordinator will attempt outreach per tier level in 6 months.   Thea Silversmith, RN, MSN, [place] [phone]

## 2019-09-25 DIAGNOSIS — I5020 Unspecified systolic (congestive) heart failure: Secondary | ICD-10-CM | POA: Diagnosis not present

## 2019-09-25 DIAGNOSIS — K219 Gastro-esophageal reflux disease without esophagitis: Secondary | ICD-10-CM | POA: Diagnosis not present

## 2019-09-25 DIAGNOSIS — I10 Essential (primary) hypertension: Secondary | ICD-10-CM | POA: Diagnosis not present

## 2019-09-25 DIAGNOSIS — R7303 Prediabetes: Secondary | ICD-10-CM | POA: Diagnosis not present

## 2019-09-25 DIAGNOSIS — E7800 Pure hypercholesterolemia, unspecified: Secondary | ICD-10-CM | POA: Diagnosis not present

## 2019-09-25 DIAGNOSIS — I4891 Unspecified atrial fibrillation: Secondary | ICD-10-CM | POA: Diagnosis not present

## 2019-09-25 DIAGNOSIS — I700 Atherosclerosis of aorta: Secondary | ICD-10-CM | POA: Diagnosis not present

## 2019-09-25 DIAGNOSIS — I25810 Atherosclerosis of coronary artery bypass graft(s) without angina pectoris: Secondary | ICD-10-CM | POA: Diagnosis not present

## 2019-09-25 DIAGNOSIS — R21 Rash and other nonspecific skin eruption: Secondary | ICD-10-CM | POA: Diagnosis not present

## 2019-09-25 DIAGNOSIS — M109 Gout, unspecified: Secondary | ICD-10-CM | POA: Diagnosis not present

## 2019-09-25 DIAGNOSIS — J449 Chronic obstructive pulmonary disease, unspecified: Secondary | ICD-10-CM | POA: Diagnosis not present

## 2019-09-25 DIAGNOSIS — N183 Chronic kidney disease, stage 3 unspecified: Secondary | ICD-10-CM | POA: Diagnosis not present

## 2019-10-08 ENCOUNTER — Other Ambulatory Visit: Payer: Self-pay

## 2019-10-08 ENCOUNTER — Other Ambulatory Visit: Payer: Self-pay | Admitting: Physician Assistant

## 2019-10-08 ENCOUNTER — Encounter (HOSPITAL_COMMUNITY): Payer: Self-pay | Admitting: Emergency Medicine

## 2019-10-08 ENCOUNTER — Inpatient Hospital Stay (HOSPITAL_COMMUNITY)
Admission: EM | Admit: 2019-10-08 | Discharge: 2019-10-11 | DRG: 378 | Disposition: A | Discharge status: Home / Self Care | Payer: HMO | Attending: Internal Medicine | Admitting: Internal Medicine

## 2019-10-08 DIAGNOSIS — K317 Polyp of stomach and duodenum: Secondary | ICD-10-CM | POA: Diagnosis present

## 2019-10-08 DIAGNOSIS — Z833 Family history of diabetes mellitus: Secondary | ICD-10-CM

## 2019-10-08 DIAGNOSIS — D123 Benign neoplasm of transverse colon: Secondary | ICD-10-CM | POA: Diagnosis not present

## 2019-10-08 DIAGNOSIS — R1310 Dysphagia, unspecified: Secondary | ICD-10-CM | POA: Diagnosis not present

## 2019-10-08 DIAGNOSIS — R7303 Prediabetes: Secondary | ICD-10-CM | POA: Diagnosis present

## 2019-10-08 DIAGNOSIS — Z6835 Body mass index (BMI) 35.0-35.9, adult: Secondary | ICD-10-CM

## 2019-10-08 DIAGNOSIS — Z20822 Contact with and (suspected) exposure to covid-19: Secondary | ICD-10-CM | POA: Diagnosis present

## 2019-10-08 DIAGNOSIS — N183 Chronic kidney disease, stage 3 unspecified: Secondary | ICD-10-CM | POA: Diagnosis present

## 2019-10-08 DIAGNOSIS — I5042 Chronic combined systolic (congestive) and diastolic (congestive) heart failure: Secondary | ICD-10-CM | POA: Diagnosis present

## 2019-10-08 DIAGNOSIS — D62 Acute posthemorrhagic anemia: Secondary | ICD-10-CM | POA: Diagnosis present

## 2019-10-08 DIAGNOSIS — K2951 Unspecified chronic gastritis with bleeding: Secondary | ICD-10-CM | POA: Diagnosis present

## 2019-10-08 DIAGNOSIS — J439 Emphysema, unspecified: Secondary | ICD-10-CM | POA: Diagnosis not present

## 2019-10-08 DIAGNOSIS — J449 Chronic obstructive pulmonary disease, unspecified: Secondary | ICD-10-CM | POA: Diagnosis present

## 2019-10-08 DIAGNOSIS — K5909 Other constipation: Secondary | ICD-10-CM | POA: Diagnosis present

## 2019-10-08 DIAGNOSIS — Z8619 Personal history of other infectious and parasitic diseases: Secondary | ICD-10-CM

## 2019-10-08 DIAGNOSIS — R7889 Finding of other specified substances, not normally found in blood: Secondary | ICD-10-CM | POA: Diagnosis not present

## 2019-10-08 DIAGNOSIS — Z87891 Personal history of nicotine dependence: Secondary | ICD-10-CM

## 2019-10-08 DIAGNOSIS — I5023 Acute on chronic systolic (congestive) heart failure: Secondary | ICD-10-CM | POA: Diagnosis present

## 2019-10-08 DIAGNOSIS — N179 Acute kidney failure, unspecified: Secondary | ICD-10-CM | POA: Diagnosis present

## 2019-10-08 DIAGNOSIS — K5730 Diverticulosis of large intestine without perforation or abscess without bleeding: Secondary | ICD-10-CM | POA: Diagnosis not present

## 2019-10-08 DIAGNOSIS — Z79899 Other long term (current) drug therapy: Secondary | ICD-10-CM

## 2019-10-08 DIAGNOSIS — K222 Esophageal obstruction: Secondary | ICD-10-CM | POA: Diagnosis present

## 2019-10-08 DIAGNOSIS — E785 Hyperlipidemia, unspecified: Secondary | ICD-10-CM | POA: Diagnosis present

## 2019-10-08 DIAGNOSIS — K449 Diaphragmatic hernia without obstruction or gangrene: Secondary | ICD-10-CM | POA: Diagnosis present

## 2019-10-08 DIAGNOSIS — K648 Other hemorrhoids: Secondary | ICD-10-CM | POA: Diagnosis present

## 2019-10-08 DIAGNOSIS — R0989 Other specified symptoms and signs involving the circulatory and respiratory systems: Secondary | ICD-10-CM | POA: Diagnosis not present

## 2019-10-08 DIAGNOSIS — Z8249 Family history of ischemic heart disease and other diseases of the circulatory system: Secondary | ICD-10-CM

## 2019-10-08 DIAGNOSIS — I252 Old myocardial infarction: Secondary | ICD-10-CM

## 2019-10-08 DIAGNOSIS — K219 Gastro-esophageal reflux disease without esophagitis: Secondary | ICD-10-CM | POA: Diagnosis not present

## 2019-10-08 DIAGNOSIS — I2510 Atherosclerotic heart disease of native coronary artery without angina pectoris: Secondary | ICD-10-CM | POA: Diagnosis present

## 2019-10-08 DIAGNOSIS — N289 Disorder of kidney and ureter, unspecified: Secondary | ICD-10-CM | POA: Diagnosis not present

## 2019-10-08 DIAGNOSIS — I10 Essential (primary) hypertension: Secondary | ICD-10-CM | POA: Diagnosis not present

## 2019-10-08 DIAGNOSIS — I493 Ventricular premature depolarization: Secondary | ICD-10-CM | POA: Diagnosis not present

## 2019-10-08 DIAGNOSIS — K922 Gastrointestinal hemorrhage, unspecified: Secondary | ICD-10-CM | POA: Diagnosis present

## 2019-10-08 DIAGNOSIS — Z7901 Long term (current) use of anticoagulants: Secondary | ICD-10-CM

## 2019-10-08 DIAGNOSIS — I4819 Other persistent atrial fibrillation: Secondary | ICD-10-CM | POA: Diagnosis present

## 2019-10-08 DIAGNOSIS — Z951 Presence of aortocoronary bypass graft: Secondary | ICD-10-CM | POA: Diagnosis not present

## 2019-10-08 DIAGNOSIS — R1314 Dysphagia, pharyngoesophageal phase: Secondary | ICD-10-CM | POA: Diagnosis not present

## 2019-10-08 DIAGNOSIS — D649 Anemia, unspecified: Secondary | ICD-10-CM | POA: Diagnosis not present

## 2019-10-08 DIAGNOSIS — I255 Ischemic cardiomyopathy: Secondary | ICD-10-CM | POA: Diagnosis present

## 2019-10-08 DIAGNOSIS — I4891 Unspecified atrial fibrillation: Secondary | ICD-10-CM | POA: Diagnosis present

## 2019-10-08 DIAGNOSIS — D128 Benign neoplasm of rectum: Secondary | ICD-10-CM | POA: Diagnosis present

## 2019-10-08 DIAGNOSIS — Z86010 Personal history of colonic polyps: Secondary | ICD-10-CM

## 2019-10-08 DIAGNOSIS — Z823 Family history of stroke: Secondary | ICD-10-CM

## 2019-10-08 DIAGNOSIS — K2980 Duodenitis without bleeding: Secondary | ICD-10-CM | POA: Diagnosis present

## 2019-10-08 DIAGNOSIS — Z807 Family history of other malignant neoplasms of lymphoid, hematopoietic and related tissues: Secondary | ICD-10-CM

## 2019-10-08 DIAGNOSIS — K921 Melena: Secondary | ICD-10-CM | POA: Diagnosis not present

## 2019-10-08 DIAGNOSIS — K644 Residual hemorrhoidal skin tags: Secondary | ICD-10-CM | POA: Diagnosis present

## 2019-10-08 DIAGNOSIS — R9431 Abnormal electrocardiogram [ECG] [EKG]: Secondary | ICD-10-CM | POA: Diagnosis not present

## 2019-10-08 DIAGNOSIS — R002 Palpitations: Secondary | ICD-10-CM | POA: Diagnosis not present

## 2019-10-08 DIAGNOSIS — I480 Paroxysmal atrial fibrillation: Secondary | ICD-10-CM

## 2019-10-08 DIAGNOSIS — R000 Tachycardia, unspecified: Secondary | ICD-10-CM | POA: Diagnosis not present

## 2019-10-08 DIAGNOSIS — K621 Rectal polyp: Secondary | ICD-10-CM | POA: Diagnosis not present

## 2019-10-08 DIAGNOSIS — I130 Hypertensive heart and chronic kidney disease with heart failure and stage 1 through stage 4 chronic kidney disease, or unspecified chronic kidney disease: Secondary | ICD-10-CM | POA: Diagnosis present

## 2019-10-08 DIAGNOSIS — Z8042 Family history of malignant neoplasm of prostate: Secondary | ICD-10-CM

## 2019-10-08 DIAGNOSIS — K5900 Constipation, unspecified: Secondary | ICD-10-CM | POA: Diagnosis not present

## 2019-10-08 DIAGNOSIS — D122 Benign neoplasm of ascending colon: Secondary | ICD-10-CM | POA: Diagnosis present

## 2019-10-08 NOTE — ED Triage Notes (Signed)
Pt brought to ED by [facility] from home after she having palpitations that started around 2200, pt was on afib with RVR 170-180 on EMS arrival to her house, 20 mg Cardizem given by EMS with HR down to 110 still AFIB. Pt denies any pain at this time. BP 126/78, HR 110, R 18, SPO2 98%RA.

## 2019-10-09 ENCOUNTER — Emergency Department (HOSPITAL_COMMUNITY): Payer: HMO

## 2019-10-09 ENCOUNTER — Encounter (HOSPITAL_COMMUNITY): Payer: Self-pay | Admitting: Internal Medicine

## 2019-10-09 ENCOUNTER — Other Ambulatory Visit: Payer: Self-pay

## 2019-10-09 DIAGNOSIS — Z951 Presence of aortocoronary bypass graft: Secondary | ICD-10-CM

## 2019-10-09 DIAGNOSIS — I255 Ischemic cardiomyopathy: Secondary | ICD-10-CM | POA: Diagnosis not present

## 2019-10-09 DIAGNOSIS — K222 Esophageal obstruction: Secondary | ICD-10-CM | POA: Diagnosis not present

## 2019-10-09 DIAGNOSIS — N183 Chronic kidney disease, stage 3 unspecified: Secondary | ICD-10-CM | POA: Diagnosis present

## 2019-10-09 DIAGNOSIS — K2951 Unspecified chronic gastritis with bleeding: Secondary | ICD-10-CM | POA: Diagnosis not present

## 2019-10-09 DIAGNOSIS — K922 Gastrointestinal hemorrhage, unspecified: Secondary | ICD-10-CM | POA: Diagnosis not present

## 2019-10-09 DIAGNOSIS — R9431 Abnormal electrocardiogram [ECG] [EKG]: Secondary | ICD-10-CM | POA: Diagnosis not present

## 2019-10-09 DIAGNOSIS — Z6835 Body mass index (BMI) 35.0-35.9, adult: Secondary | ICD-10-CM | POA: Diagnosis not present

## 2019-10-09 DIAGNOSIS — D62 Acute posthemorrhagic anemia: Secondary | ICD-10-CM | POA: Diagnosis present

## 2019-10-09 DIAGNOSIS — N179 Acute kidney failure, unspecified: Secondary | ICD-10-CM | POA: Diagnosis not present

## 2019-10-09 DIAGNOSIS — I4819 Other persistent atrial fibrillation: Secondary | ICD-10-CM | POA: Diagnosis not present

## 2019-10-09 DIAGNOSIS — I4891 Unspecified atrial fibrillation: Secondary | ICD-10-CM

## 2019-10-09 DIAGNOSIS — I2510 Atherosclerotic heart disease of native coronary artery without angina pectoris: Secondary | ICD-10-CM | POA: Diagnosis present

## 2019-10-09 DIAGNOSIS — K449 Diaphragmatic hernia without obstruction or gangrene: Secondary | ICD-10-CM | POA: Diagnosis present

## 2019-10-09 DIAGNOSIS — Z20822 Contact with and (suspected) exposure to covid-19: Secondary | ICD-10-CM | POA: Diagnosis not present

## 2019-10-09 DIAGNOSIS — I5042 Chronic combined systolic (congestive) and diastolic (congestive) heart failure: Secondary | ICD-10-CM | POA: Diagnosis not present

## 2019-10-09 DIAGNOSIS — K648 Other hemorrhoids: Secondary | ICD-10-CM | POA: Diagnosis not present

## 2019-10-09 DIAGNOSIS — I130 Hypertensive heart and chronic kidney disease with heart failure and stage 1 through stage 4 chronic kidney disease, or unspecified chronic kidney disease: Secondary | ICD-10-CM | POA: Diagnosis not present

## 2019-10-09 DIAGNOSIS — J449 Chronic obstructive pulmonary disease, unspecified: Secondary | ICD-10-CM | POA: Diagnosis present

## 2019-10-09 DIAGNOSIS — K317 Polyp of stomach and duodenum: Secondary | ICD-10-CM | POA: Diagnosis not present

## 2019-10-09 LAB — MAGNESIUM: Magnesium: 2.1 mg/dL (ref 1.7–2.4)

## 2019-10-09 LAB — CBC
HCT: 25.4 % — ABNORMAL LOW (ref 36.0–46.0)
HCT: 29.8 % — ABNORMAL LOW (ref 36.0–46.0)
Hemoglobin: 7.7 g/dL — ABNORMAL LOW (ref 12.0–15.0)
Hemoglobin: 9.3 g/dL — ABNORMAL LOW (ref 12.0–15.0)
MCH: 28.1 pg (ref 26.0–34.0)
MCH: 28.4 pg (ref 26.0–34.0)
MCHC: 30.3 g/dL (ref 30.0–36.0)
MCHC: 31.2 g/dL (ref 30.0–36.0)
MCV: 91.1 fL (ref 80.0–100.0)
MCV: 92.7 fL (ref 80.0–100.0)
Platelets: 176 10*3/uL (ref 150–400)
Platelets: 192 10*3/uL (ref 150–400)
RBC: 2.74 MIL/uL — ABNORMAL LOW (ref 3.87–5.11)
RBC: 3.27 MIL/uL — ABNORMAL LOW (ref 3.87–5.11)
RDW: 16.1 % — ABNORMAL HIGH (ref 11.5–15.5)
RDW: 16.6 % — ABNORMAL HIGH (ref 11.5–15.5)
WBC: 10.4 10*3/uL (ref 4.0–10.5)
WBC: 9.4 10*3/uL (ref 4.0–10.5)
nRBC: 0 % (ref 0.0–0.2)
nRBC: 0 % (ref 0.0–0.2)

## 2019-10-09 LAB — BASIC METABOLIC PANEL
Anion gap: 9 (ref 5–15)
Anion gap: 12 (ref 5–15)
BUN: 29 mg/dL — ABNORMAL HIGH (ref 8–23)
BUN: 25 mg/dL — ABNORMAL HIGH (ref 8–23)
CO2: 21 mmol/L — ABNORMAL LOW (ref 22–32)
CO2: 18 mmol/L — ABNORMAL LOW (ref 22–32)
Calcium: 8.7 mg/dL — ABNORMAL LOW (ref 8.9–10.3)
Calcium: 8.8 mg/dL — ABNORMAL LOW (ref 8.9–10.3)
Chloride: 107 mmol/L (ref 98–111)
Chloride: 109 mmol/L (ref 98–111)
Creatinine, Ser: 1.76 mg/dL — ABNORMAL HIGH (ref 0.44–1.00)
Creatinine, Ser: 1.46 mg/dL — ABNORMAL HIGH (ref 0.44–1.00)
GFR calc Af Amer: 32 mL/min — ABNORMAL LOW (ref >60)
GFR calc Af Amer: 41 mL/min — ABNORMAL LOW (ref >60)
GFR calc non Af Amer: 28 mL/min — ABNORMAL LOW (ref >60)
GFR calc non Af Amer: 35 mL/min — ABNORMAL LOW (ref >60)
Glucose, Bld: 149 mg/dL — ABNORMAL HIGH (ref 70–99)
Glucose, Bld: 109 mg/dL — ABNORMAL HIGH (ref 70–99)
Potassium: 4.1 mmol/L (ref 3.5–5.1)
Potassium: 4.5 mmol/L (ref 3.5–5.1)
Sodium: 137 mmol/L (ref 135–145)
Sodium: 139 mmol/L (ref 135–145)

## 2019-10-09 LAB — CBG MONITORING, ED
Glucose-Capillary: 125 mg/dL — ABNORMAL HIGH (ref 70–99)
Glucose-Capillary: 112 mg/dL — ABNORMAL HIGH (ref 70–99)

## 2019-10-09 LAB — TROPONIN I (HIGH SENSITIVITY)
Troponin I (High Sensitivity): 10 ng/L (ref <18)
Troponin I (High Sensitivity): 8 ng/L (ref <18)

## 2019-10-09 LAB — PREPARE RBC (CROSSMATCH)

## 2019-10-09 LAB — SARS CORONAVIRUS 2 (TAT 6-24 HRS): SARS Coronavirus 2: NEGATIVE

## 2019-10-09 LAB — PROTIME-INR
INR: 1.6 — ABNORMAL HIGH (ref 0.8–1.2)
Prothrombin Time: 18.5 seconds — ABNORMAL HIGH (ref 11.4–15.2)

## 2019-10-09 LAB — APTT: aPTT: 37 seconds — ABNORMAL HIGH (ref 24–36)

## 2019-10-09 LAB — POC OCCULT BLOOD, ED: Fecal Occult Bld: POSITIVE — AB

## 2019-10-09 MED ORDER — METOPROLOL TARTRATE 5 MG/5ML IV SOLN
2.5000 mg | Freq: Once | INTRAVENOUS | Status: AC
  Administered 2019-10-09: 2.5 mg via INTRAVENOUS
  Filled 2019-10-09: qty 5

## 2019-10-09 MED ORDER — DILTIAZEM HCL-DEXTROSE 125-5 MG/125ML-% IV SOLN (PREMIX)
5.0000 mg/h | INTRAVENOUS | Status: DC
  Administered 2019-10-09: 5 mg/h via INTRAVENOUS
  Filled 2019-10-09: qty 125

## 2019-10-09 MED ORDER — METOPROLOL TARTRATE 5 MG/5ML IV SOLN
5.0000 mg | Freq: Four times a day (QID) | INTRAVENOUS | Status: DC
  Administered 2019-10-09 – 2019-10-11 (×7): 5 mg via INTRAVENOUS
  Filled 2019-10-09 (×8): qty 5

## 2019-10-09 MED ORDER — METOPROLOL TARTRATE 25 MG PO TABS: 25.0000 mg | ORAL_TABLET | Freq: Four times a day (QID) | ORAL | Status: DC

## 2019-10-09 MED ORDER — SODIUM CHLORIDE 0.9 % IV SOLN
80.0000 mg | Freq: Once | INTRAVENOUS | Status: AC
  Administered 2019-10-09: 80 mg via INTRAVENOUS
  Filled 2019-10-09: qty 80

## 2019-10-09 MED ORDER — PANTOPRAZOLE SODIUM 40 MG IV SOLR: 40.0000 mg | Freq: Two times a day (BID) | INTRAVENOUS | Status: DC

## 2019-10-09 MED ORDER — SODIUM CHLORIDE 0.9 % IV BOLUS
250.0000 mL | Freq: Once | INTRAVENOUS | Status: AC
  Administered 2019-10-09: 250 mL via INTRAVENOUS

## 2019-10-09 MED ORDER — ACETAMINOPHEN 325 MG PO TABS: 650.0000 mg | ORAL_TABLET | Freq: Four times a day (QID) | ORAL | Status: DC | PRN

## 2019-10-09 MED ORDER — SODIUM CHLORIDE 0.9 % IV SOLN
8.0000 mg/h | INTRAVENOUS | Status: DC
  Administered 2019-10-09 – 2019-10-10 (×3): 8 mg/h via INTRAVENOUS
  Filled 2019-10-09 (×6): qty 80

## 2019-10-09 MED ORDER — DILTIAZEM HCL 100 MG IV SOLR
5.0000 mg/h | INTRAVENOUS | Status: DC
  Filled 2019-10-09 (×2): qty 100

## 2019-10-09 MED ORDER — SODIUM CHLORIDE 0.9% IV SOLUTION
Freq: Once | INTRAVENOUS | Status: AC
  Administered 2019-10-09: 10 mL/h via INTRAVENOUS

## 2019-10-09 MED ORDER — ACETAMINOPHEN 650 MG RE SUPP: 650.0000 mg | Freq: Four times a day (QID) | RECTAL | Status: DC | PRN

## 2019-10-09 NOTE — Patient Outreach (Signed)
  [facility]) Care Management Chronic Special Needs Program    10/09/2019  Name: [name] [name], Nghiem: 12/24/1945  MRN: [id number]   Ms. [name] is enrolled in a chronic special needs plan for Heart Failure. RNCM received notification that client presented to the hospital on 10/08/2019. Admitted with gastroentestinal hemorrhage with melena, anemia due to acute blood loss, PAF with RVR and renal insufficiency.  Per policy and procedure care plan sent to utilization management team.  Plan: RNCM will care coordinate with [facility] hospital liaison and inpatient care management as indicated. RNCM will continue to follow.  Thea Silversmith, RN, MSN, [place] [phone]

## 2019-10-09 NOTE — ED Notes (Signed)
Blood transfusion finished at 5:30 am, morning labs to be collected at 7:30.

## 2019-10-09 NOTE — Plan of Care (Signed)
  Problem: Coping: Goal: Ability to identify and develop effective coping behavior will improve Outcome: Progressing   Problem: Education: Goal: Ability to identify signs and symptoms of gastrointestinal bleeding will improve Outcome: Progressing

## 2019-10-09 NOTE — Consult Note (Addendum)
Cardiology Consultation:   Patient ID: [name] MRN: [id number]; DOB: 1945/10/04  Admit date: 10/08/2019 Date of Consult: 10/09/2019  Primary Care Provider: [name], MD Primary Cardiologist: [name] Furbish, MD / [name] Bickers, MD Primary Electrophysiologist:  None    Patient Profile:   [name] [name] is a 74 y.o. female with a history of CAD s/p CABG x2 in 2016, chronic combined CHF/ischemic cardiomyopathy with EF of 30-35% on Echo in 2019, paroxysmal atrial fibrillation on Xarelto, COPD, hypertension, hyperlipidemia, pre-diabetes, CKD stage III, GERD, mediation non-compliance, and prior tobacco use (quit in 2016) who is being seen today for the evaluation of atrial fibrillation with RVR at the request of [name] [name].  History of Present Illness:   [name] [name] is a 74 year old female with the above history who is followed by [name] [name] and [name] [name]. Patient initially seen in 11/2014 for atrial fibrillation. She was found to have a reduced LVEF at that time leading to an ischemic work-up which showed severe multivessel CAD with left main involvement. She ended up undergoing CABG x2 with LIMA to LAD and SVG to OM at that time. She has a history of medication non-compliance and has significant memory issues; therefore, she has not been felt to be a good ablation or anti-arrhythmic candidate in the past. Most recent Echo from 07/2018 showed LVEF of 30-35% with normal wall motion, grade 1 diastolic dysfunction, and mild MR. RV was also noted to be mildly dilated with moderately to severely reduced systolic function. She has previously been intolerant to [facility] due to chest pain. Most recent ischemic work-up was a Lexiscan Myoview in 08/2018 which was [name] risk and showed no reversible ischemia. Patient was most recently seen by [name] Grinder, NP with our [facility], on 08/27/2019 at which time she reported some chest discomfort the night prior after carrying grocies into the how;  however, this was felt to be musculoskeletal in nature. She was otherwise doing well. She was in normal sinus rhythm at that visit and denied any acute CHF symptoms.   Patient presented to the ED yesterday via EMS for atrial fibrillation. Patient reports she started to notice black stool this past weekend. She was reportedly seen in her PCP's office yesterday and advised to stop her Xarelto. Last that day she developed sudden onset of palpitations. She states she may have had some associated lightheadedness with this but states she did not think much about it. No falls or syncope. She also noted some shortness of breath with this but no chest pain. She states she has had indigestion recently but states this is not new. She has been taking Pepto-Bismol and Zantac for this. She also notes some diffuse abdominal pressure when she gets up and starts moving around. No real chest pain though. No nausea, vomiting, or diarrhea. No recent orthopnea, PND, or edema. No recent fevers or illnesses. No hematochezia or hematuria.   Given palpitations, patient called 911. Upon EMS arrival, patient was found to be in atrial fibrillation with rates in the 170's to 180's. IV Cardizem 20mg  was given and heart rate improved to the 110's but she was still in atrial fibrillation.   In the ED, patient has continued to be tachycardic. BP soft at times with systolic BP in the [id number]. EKG showed atrial fibrillation, rate 133 bpm, with T wave inversion in leads I and aVL and mild ST depression in leads II and V4-V6. High-sensitivity troponin negative x2. Chest x-ray showed stable background  emphysema but no acute findings. WBC 9.4, Hgb 7.7, Plts 192. Na 137, K 4.1, Glucose 149, BUN 29, Cr 1.76. Hemoccult positive. Patient was admitted and has since been started on IV Cardizem with improvement in rates. She received 1 unit of PRBCs with improvement of hemoglobin to 9.3. She also received 284mL bolus of NaCl due to soft BP. Cardiology  consulted for further evaluation.  At the time of this evaluation, patient resting comfortably. She reports feeling a little better since blood transfusion.   Heart Pathway Score:     Past Medical History:  Diagnosis Date  . A-fib (HCC)    RVR- POST CABG- RESOLVED. S/P NSTEMI 3/16  . Acute respiratory failure ([place]) 08/2017  . Arthritis   . Asthma    ??  . Atopic dermatitis   . CAD (coronary artery disease)    a. s/p CABG 2016.  . Cardiac arrest ([place]) 10/2014   [name]  . Cardiomyopathy, ischemic    a. EF previously [name], improved to LVEF 50-55% as of May 2018  . Chronic combined systolic and diastolic heart failure ([place])   . CKD (chronic kidney disease), stage III   . COPD (chronic obstructive pulmonary disease) ([place])   . Esophageal dilatation 2013  . GERD (gastroesophageal reflux disease)   . Gout   . Headache   . Hypertension   . Hypokalemia   . Idiopathic angioedema   . LGI bleed 08/06/2017   a. felt to be hemorrhoidal during that admission (no drop in Hgb).  . Lower back pain   . Persistent atrial fibrillation (HCC)    a. h/o difficult to control rates (complicated by noncompliance), not felt to be a candidate for ablation or antiarrhythmic due to noncompliance.  . Personal history of noncompliance with medical treatment, presenting hazards to health   . Prediabetes   . S/P CABG x 2 with clipping of LA appendage 11/14/2014   LIMA to LAD, SVG to OM, EVH via right thigh  . Urine incontinence   . Uterine fibroid     Past Surgical History:  Procedure Laterality Date  . CARDIOVERSION N/A 11/18/2014   Procedure: CARDIOVERSION;  Surgeon: [name], MD;  Location: [place];  Service: Cardiovascular;  Laterality: N/A;  . CARDIOVERSION N/A 12/25/2017   Procedure: CARDIOVERSION;  Surgeon: [name], MD;  Location: [place];  Service: Cardiovascular;  Laterality: N/A;  . CLIPPING OF ATRIAL APPENDAGE N/A 11/14/2014   Procedure: CLIPPING OF ATRIAL APPENDAGE;   Surgeon: [name], MD;  Location: [place];  Service: Open Heart Surgery;  Laterality: N/A;  . COLONOSCOPY  2013  . CORONARY ARTERY BYPASS GRAFT N/A 11/14/2014   Procedure: CORONARY ARTERY BYPASS GRAFTING (CABG)TIMES 2 USING LEFT INTERNAL MAMMARY ARTERY AND RIGHT SAPHENOUS VEIN HARVESTED ENDOSCOPICALLY;  Surgeon: [name], MD;  Location: [place];  Service: Open Heart Surgery;  Laterality: N/A;  . LEFT HEART CATHETERIZATION WITH CORONARY ANGIOGRAM N/A 11/04/2014   Procedure: LEFT HEART CATHETERIZATION WITH CORONARY ANGIOGRAM;  Surgeon: Troy Sine, MD;  Location: [facility] CATH LAB;  Service: Cardiovascular;  Laterality: N/A;  . TEE WITHOUT CARDIOVERSION N/A 11/14/2014   Procedure: TRANSESOPHAGEAL ECHOCARDIOGRAM (TEE);  Surgeon: [name], MD;  Location: [place];  Service: Open Heart Surgery;  Laterality: N/A;  . TEE WITHOUT CARDIOVERSION N/A 12/25/2017   Procedure: TRANSESOPHAGEAL ECHOCARDIOGRAM (TEE);  Surgeon: [name], MD;  Location: [facility] ENDOSCOPY;  Service: Cardiovascular;  Laterality: N/A;  . TEMPORARY PACEMAKER INSERTION  11/04/2014  Procedure: TEMPORARY PACEMAKER INSERTION;  Surgeon: Troy Sine, MD;  Location: [facility] CATH LAB;  Service: Cardiovascular;;  . TOOTH EXTRACTION       Home Medications:  Prior to Admission medications   Medication Sig Start Date End Date Taking? Authorizing Provider  acetaminophen (TYLENOL) 500 MG tablet Take 500 mg by mouth every 6 (six) hours as needed for moderate pain.    Yes [provider]  allopurinol (ZYLOPRIM) 300 MG tablet Take 1 tablet (300 mg total) by mouth daily. 01/04/18  Yes Georgiana Shore, NP  atorvastatin (LIPITOR) 10 MG tablet Take 10 mg by mouth daily.   Yes [provider]  carvedilol (COREG) 3.125 MG tablet Take 1 tablet (3.125 mg total) by mouth 2 (two) times daily with a meal. 08/03/18  Yes Bensimhon, Shaune Pascal, MD  furosemide (LASIX) 20 MG tablet TAKE 3 TABLETS (60 MG TOTAL) BY MOUTH 2 (TWO) TIMES DAILY.  11/22/17  Yes Hilty, Nadean Corwin, MD  losartan (COZAAR) 25 MG tablet Take 1 tablet (25 mg total) by mouth 2 (two) times daily. 07/17/19  Yes Bensimhon, Shaune Pascal, MD  pantoprazole (PROTONIX) 40 MG tablet Take 40 mg by mouth daily.   Yes [provider]  rivaroxaban (XARELTO) 20 MG TABS tablet Take 1 tablet (20 mg total) by mouth daily with supper. 03/02/18  Yes Bensimhon, Shaune Pascal, MD  spironolactone (ALDACTONE) 25 MG tablet Take 1 tablet (25 mg total) by mouth daily. 07/17/19 10/15/19 Yes Bensimhon, Shaune Pascal, MD  magnesium oxide (MAG-OX) 400 MG tablet Take 1 tablet (400 mg total) by mouth 3 (three) times daily. Patient not taking: Reported on 08/27/2019 12/08/17   Bensimhon, Shaune Pascal, MD    Inpatient Medications: Scheduled Meds: . [START ON 10/12/2019] pantoprazole  40 mg Intravenous Q12H   Continuous Infusions: . dilTIAZem HCl-Dextrose 12.5 mg/hr (10/09/19 1257)  . pantoprozole (PROTONIX) infusion 8 mg/hr (10/09/19 0607)   PRN Meds: acetaminophen **OR** acetaminophen  Allergies:    Allergies  Allergen Reactions  . Bee Venom Anaphylaxis  . Codeine Nausea And Vomiting  . Entresto [Sacubitril-Valsartan]     Chest pain   . Shrimp [Shellfish Allergy] Swelling  . Tomato     Pt states it gives her gout    Social History:   Social History   Socioeconomic History  . Marital status: Widowed    Spouse name: Not on file  . Number of children: 1  . Years of education: 36  . Highest education level: Not on file  Occupational History  . Occupation: RETIRED [employer]  Tobacco Use  . Smoking status: Former Smoker    Packs/day: 0.10    Years: 56.00    Pack years: 5.60    Types: Cigarettes    Quit date: 2019    Years since quitting: 2.1  . Smokeless tobacco: Never Used  Substance and Sexual Activity  . Alcohol use: No    Alcohol/week: 0.0 standard drinks  . Drug use: No  . Sexual activity: Not on file  Other Topics Concern  . Not on file  Social History Narrative    Patient reports it being difficult to pay for everything due to outstanding medical bills from hospital stay last year but states she is able to keep up with basic expenses.  Patient does have some concerns with her house's condition due to a water leak- had her roof repaired last month but has some leaking in the front which has affected her [name].  Patient owns her  own car and is able to drive herself but has some concerns about the reliability of her car- has gotten taxi to the clinic for appointment in the past when her car broke down.   Social Determinants of Health   Financial Resource Strain:   . Difficulty of Paying Living Expenses: Not on file  Food Insecurity:   . Worried About Charity fundraiser in the Last Year: Not on file  . Ran Out of Food in the Last Year: Not on file  Transportation Needs:   . Lack of Transportation (Medical): Not on file  . Lack of Transportation (Non-Medical): Not on file  Physical Activity:   . Days of Exercise per Week: Not on file  . Minutes of Exercise per Session: Not on file  Stress:   . Feeling of Stress : Not on file  Social Connections:   . Frequency of Communication with Friends and Family: Not on file  . Frequency of Social Gatherings with Friends and Family: Not on file  . Attends Religious Services: Not on file  . Active Member of Clubs or Organizations: Not on file  . Attends Archivist Meetings: Not on file  . Marital Status: Not on file  Intimate Partner Violence:   . Fear of Current or Ex-Partner: Not on file  . Emotionally Abused: Not on file  . Physically Abused: Not on file  . Sexually Abused: Not on file    Family History:    Family History  Problem Relation Age of Onset  . Cancer Mother        LYMPHOMA  . Heart disease Father   . Other Father        TB  . CVA Sister   . Prostate cancer Brother 32  . Diabetes Brother      ROS:  Please see the history of present illness.  Review of Systems    Constitutional: Negative for chills and fever.  HENT: Negative for congestion.   Respiratory: Positive for shortness of breath. Negative for hemoptysis.   Cardiovascular: Positive for palpitations. Negative for chest pain, orthopnea, leg swelling and PND.  Gastrointestinal: Positive for abdominal pain, heartburn and melena. Negative for blood in stool, nausea and vomiting.  Genitourinary: Negative for hematuria.  Musculoskeletal: Negative for falls and myalgias.  Neurological: Negative for loss of consciousness.  Endo/Heme/Allergies: Does not bruise/bleed easily.  Psychiatric/Behavioral: Positive for memory loss (history of memory issues). Negative for substance abuse.  All other systems reviewed and are negative.  Physical Exam/Data:   Vitals:   10/09/19 1100 10/09/19 1130 10/09/19 1220 10/09/19 1230  BP: (!) 126/110 128/69 (!) 96/58 117/75  Pulse: (!) 130 (!) 45 (!) 113 (!) 106  Resp: (!) 23 16 (!) 25 (!) 23  Temp:      TempSrc:      SpO2: 97% 100% 98% 100%  Weight:      Height:        Intake/Output Summary (Last 24 hours) at 10/09/2019 1335 Last data filed at 10/09/2019 [id number] Gross per 24 hour  Intake 1066.53 ml  Output --  Net 1066.53 ml   Last 3 Weights 10/08/2019 08/27/2019 06/11/2019  Weight (lbs) 225 lb 230 lb 3.2 oz 231 lb  Weight (kg) 102.059 kg 104.418 kg 104.781 kg     Body mass index is 35.24 kg/m.  General: 73 y.o. obese African-American female resting comfortably in no acute distress. HEENT: Normocephalic and atraumatic. Sclera clear.  Neck: Supple. No carotid bruits. No JVD.  Heart: Irregularly irregular rhythm with normal rate. Distinct S1 and S2. No murmurs, gallops, or rubs. Radial and distal pedal pulses 2+ and equal bilaterally. Lungs: No increased work of breathing. Decreased breath sounds bilaterally but no wheezes, rhonchi, or rales appreciated. Abdomen: Soft, non-distended, and non-tender to palpation. Bowel sounds present in all 4 quadrants.   Extremities: Trace lower extremity edema.    Skin: Warm and dry. Neuro: No focal deficits. Psych: Normal affect. Responds appropriately.  EKG:  The EKG was personally reviewed and demonstrates: Atrial fibrillation, rate 133 bpm, with T wave inversion in leads I and aVL and mild ST depression in leads II and V4-V6 Telemetry:  Telemetry was personally reviewed and demonstrates:  Atrial fibrillation and PVCs. Rates as high as the 160's. Rates currently well controlled in the 80's to 90's.  Relevant CV Studies:  Echocardiogram 08/03/2018: Study Conclusions: - Left ventricle: The cavity size was mildly dilated. Systolic  function was moderately to severely reduced. The estimated  ejection fraction was in the range of 30% to 35%. Wall motion was  normal; there were no regional wall motion abnormalities. Doppler  parameters are consistent with abnormal left ventricular  relaxation (grade 1 diastolic dysfunction).  - Aortic valve: Transvalvular velocity was within the normal range.  There was no stenosis. There was trivial regurgitation.  - Mitral valve: There was mild regurgitation.  - Left atrium: The atrium was severely dilated.  - Right ventricle: The cavity size was mildly dilated. Wall  thickness was normal. Systolic function was moderately to  severely reduced. RV systolic pressure (S, est): 28 mm Hg.  - Right atrium: The atrium was moderately dilated.  - Inferior vena cava: The vessel was normal in size. The  respirophasic diameter changes were blunted (< 50%).  - Pericardium, extracardiac: There was no pericardial effusion.  _______________  Carlton Adam Myoview 09/07/2018:  Nuclear stress EF: 44%.  There was no ST segment deviation noted during stress.  The study is normal.  This is a [name] risk study.  The left ventricular ejection fraction is moderately decreased (30-44%).  Laboratory Data:  High Sensitivity Troponin:   Recent Labs  Lab 10/09/19 0009  10/09/19 0138  TROPONINIHS 10 8     Chemistry Recent Labs  Lab 10/09/19 0009 10/09/19 0743  NA 137 139  K 4.1 4.5  CL 107 109  CO2 21* 18*  GLUCOSE 149* 109*  BUN 29* 25*  CREATININE 1.76* 1.46*  CALCIUM 8.7* 8.8*  GFRNONAA 28* 35*  GFRAA 32* 41*  ANIONGAP 9 12    No results for input(s): PROT, ALBUMIN, AST, ALT, ALKPHOS, BILITOT in the last 168 hours. Hematology Recent Labs  Lab 10/09/19 0009 10/09/19 0743  WBC 9.4 10.4  RBC 2.74* 3.27*  HGB 7.7* 9.3*  HCT 25.4* 29.8*  MCV 92.7 91.1  MCH 28.1 28.4  MCHC 30.3 31.2  RDW 16.6* 16.1*  PLT 192 176   BNPNo results for input(s): BNP, PROBNP in the last 168 hours.  DDimer No results for input(s): DDIMER in the last 168 hours.   Radiology/Studies:  DG Chest 2 View  Result Date: 10/09/2019 CLINICAL DATA:  Atrial fibrillation, palpitations EXAM: CHEST - 2 VIEW COMPARISON:  06/11/2019 FINDINGS: Frontal and lateral views of the chest demonstrates stable postsurgical changes from median sternotomy. The cardiac silhouette is unremarkable. Stable ectasia of the thoracic aorta. Lungs are hyperinflated. No airspace disease, effusion, or pneumothorax. No acute bony abnormalities. IMPRESSION: 1. Stable background emphysema.  No acute process. Electronically Signed  By: Randa Ngo M.D.   On: 10/09/2019 00:33    Assessment and Plan:   Atrial Fibrillation with RVR - Patient presented with atrial fibrillation with RVR in setting of GI bleed. Rates initially as high as the 170's to 180's but are now well controlled on Cardizem drip. - Most recent Echo in 07/2018 showed LVEF of 30-35%. - Repeat Echo was ordered at last follow-up appointment so will go ahead and check that now. - Potassium 4.1 - Will check Magnesium. - Currently IV Cardizem for rate control. Given reduced EF, will stop Cardizem and start IV Lopressor 5mg  every 6 hours since patient is NPO. Can switch to PO Lopressor 25mg  every 6 hours after possible EGD. - She has  previously not felt to be a good candidate for ablation or Tikosyn due to history of medication non-compliance. Patient has COPD with emphysema noted on chest x-ray, so Amiodarone is probably not a good option.  - CHA2DS2-VASc = 5 (CHF, HTN, age >72, CAD, female). Patient on Xarelto 20mg  daily at home but this is currently on hold due to acute anemia with presumed upper GI bleeding and hemoglobin of 7.7. Restart when OK with GI.  CAD s/p CABG in 2016  - History of CABG x2 (LIMA to LAD and SVG to OM) in 2016. [name] risk Myoview in 08/2018. - Patient reports indigestion and abdominal discomfort with activity but no real chest pain. - EKG shows T wave inversion in leads I I and aVL (which are not new) and mild ST depression in leads II and V4-V6. ST depression may be secondary to tachycardia. - High-sensitivity troponin negative x2.  - Will check Echo.  - No additional ischemic work-up planned at this time with acute GI bleed.  Chronic Combined CHF/Ishcemic Cardiomyopathy - Most recent Echo in 07/2018 showed LVEF of 30-35% with normal wall motion, grade 1 diastolic dysfunction, and mild MR. RV was also noted to be mildly dilated with moderately to severely reduced systolic function. - Patient appears euvolemic on exam.  - Home Lasix currently on hold. Will need to watch volume status closely after transfusion. - Will hold home Coreg, Losartan, and Spirnolactone right now given soft BP at times.  - Monitor daily weight, strict I/O's, and renal function.  Acute Anemia/ Presumed Upper GI Bleed - Hemoglobin 7.7 on presentation. She reports melena for the past several days. - Hemoccult positive.  - S/p 1 unit of PRBC. Repeat hemoglobin 9.3. - GI consulted.   - Other than recurrence of atrial fibrillation, patient stable from a cardiac standpoint (no recent angina or acute CHF symptoms) and would be at acceptable risk for EGD/colonscopy if needed.   Hypertension - BP has been soft at times with  systolic BP in the [id number]. Most recent BP 105/69.  - Will discontinue IV Cardizem and start PO Lopressor as above.  Hyperlipidemia - Continue home Lipitor 10mg  daily.  Acute on Chronic CKD Stage III - Creatinine 1.76 on admission and 1.46 on repeat. Baseline 1.3 to 1.5. - Suspect pre-renal due hypovolemia from GI bleed. - Continue to monitor daily weights.   COPD - No active wheezing. - Management per primary team.  For questions or updates, please contact [place] Please consult [email or web address] for contact info under     Signed, Darreld Mclean, PA-C  10/09/2019 1:35 PM  Personally seen and examined. Agree with above.   74 year old patient with coronary artery disease ischemic cardiomyopathy prior history of paroxysmal atrial fibrillation chronic kidney disease on  anticoagulation admitted with anemia found to be in atrial fibrillation with rapid ventricular response.  Currently comfortable in bed laying flat appears euvolemic talking to her sister in [place] on the phone.  Lungs are clear heart irregularly irregular no edema  Labs reviewed as above.  Troponin negative. EKG-A. fib with RVR  Assessment and plan  Paroxysmal atrial fibrillation Coronary artery disease status post CABG Blood loss anemia -We will go ahead and exchange the Cardizem drip for metoprolol IV 5 mg every 6 hours because of her underlying cardiomyopathy.  We usually try to avoid diltiazem or Cardizem in the setting due to its potential negative inotropic effect. -She has been seen in the past by electrophysiology and has been deemed not a good candidate for antiarrhythmics.  She does have a diagnosis of underlying COPD so I would like to try to avoid amiodarone. -Holding anticoagulation because of hemoglobin in the 7 range, GI bleed. -Currently comfortable in bed.  In no distress. She may proceed with endoscopy procedure. -We will go ahead and hold other antihypertensives that she is  taking.  [name] Furbish, MD

## 2019-10-09 NOTE — Progress Notes (Signed)
HR currently 56-59

## 2019-10-09 NOTE — Plan of Care (Signed)
  Problem: Coping: Goal: Ability to identify and develop effective coping behavior will improve Outcome: Progressing   Problem: Self-Concept: Goal: Ability to identify factors that promote anxiety will improve Outcome: Progressing Goal: Level of anxiety will decrease Outcome: Progressing Goal: Ability to modify response to factors that promote anxiety will improve Outcome: Progressing   Problem: Education: Goal: Ability to identify signs and symptoms of gastrointestinal bleeding will improve Outcome: Progressing   Problem: Bowel/Gastric: Goal: Will show no signs and symptoms of gastrointestinal bleeding Outcome: Progressing   Problem: Fluid Volume: Goal: Will show no signs and symptoms of excessive bleeding Outcome: Progressing   Problem: Clinical Measurements: Goal: Complications related to the disease process, condition or treatment will be avoided or minimized Outcome: Progressing

## 2019-10-09 NOTE — H&P (Signed)
History and Physical    [name] [name] [id number] DOB: 1946-02-21 DOA: 10/08/2019  PCP: [name], MD  Patient coming from: Home.  Chief Complaint: Palpitation.  HPI: [name] [name] is a 74 y.o. female with history of CAD status post CABG, ischemic cardiomyopathy, paroxysmal atrial fibrillation, COPD presents to the ER because of palpitations.  Patient had gone to her PCP earlier yesterday for complaints of having black stools ongoing for last 1 week.  Denies taking any NSAIDs.  Has not had any abdominal pain or nausea vomiting.  Her primary care physician had checked her blood test and was told that her hemoglobin was low and was planning to refer her to the gastroenterologist and was advised to hold off of Xarelto which patient did not take last night.  Last night at home patient started having palpitations with some chest tightness.  Was brought to the ER.  ED Course: In the ER patient is found to be in A. fib with RVR and blood test show hemoglobin of 7.7 which is a drop from 14 from 2019.  High sensitive troponin was 10 and 8.  INR 1.6.  Patient was given IV metoprolol 2.5 mg IV doses twice.  Cardizem infusion was not started since patient blood pressure was in the low normal.  Patient admitted for acute GI bleed with A. fib with RVR.  Covid test is pending.  Review of Systems: As per HPI, rest all negative.   Past Medical History:  Diagnosis Date  . A-fib (HCC)    RVR- POST CABG- RESOLVED. S/P NSTEMI 3/16  . Acute respiratory failure ([place]) 08/2017  . Arthritis   . Asthma    ??  . Atopic dermatitis   . CAD (coronary artery disease)    a. s/p CABG 2016.  . Cardiac arrest ([place]) 10/2014   [name]  . Cardiomyopathy, ischemic    a. EF previously low, improved to LVEF 50-55% as of May 2018  . Chronic combined systolic and diastolic heart failure ([place])   . CKD (chronic kidney disease), stage III   . COPD (chronic obstructive pulmonary disease) ([place])   . Esophageal  dilatation 2013  . GERD (gastroesophageal reflux disease)   . Gout   . Headache   . Hypertension   . Hypokalemia   . Idiopathic angioedema   . LGI bleed 08/06/2017   a. felt to be hemorrhoidal during that admission (no drop in Hgb).  . Lower back pain   . Persistent atrial fibrillation (HCC)    a. h/o difficult to control rates (complicated by noncompliance), not felt to be a candidate for ablation or antiarrhythmic due to noncompliance.  . Personal history of noncompliance with medical treatment, presenting hazards to health   . Prediabetes   . S/P CABG x 2 with clipping of LA appendage 11/14/2014   LIMA to LAD, SVG to OM, EVH via right thigh  . Urine incontinence   . Uterine fibroid     Past Surgical History:  Procedure Laterality Date  . CARDIOVERSION N/A 11/18/2014   Procedure: CARDIOVERSION;  Surgeon: [name], MD;  Location: [place];  Service: Cardiovascular;  Laterality: N/A;  . CARDIOVERSION N/A 12/25/2017   Procedure: CARDIOVERSION;  Surgeon: [name], MD;  Location: [place];  Service: Cardiovascular;  Laterality: N/A;  . CLIPPING OF ATRIAL APPENDAGE N/A 11/14/2014   Procedure: CLIPPING OF ATRIAL APPENDAGE;  Surgeon: [name], MD;  Location: [place];  Service: Open Heart Surgery;  Laterality:  N/A;  . COLONOSCOPY  2013  . CORONARY ARTERY BYPASS GRAFT N/A 11/14/2014   Procedure: CORONARY ARTERY BYPASS GRAFTING (CABG)TIMES 2 USING LEFT INTERNAL MAMMARY ARTERY AND RIGHT SAPHENOUS VEIN HARVESTED ENDOSCOPICALLY;  Surgeon: [name], MD;  Location: [place];  Service: Open Heart Surgery;  Laterality: N/A;  . LEFT HEART CATHETERIZATION WITH CORONARY ANGIOGRAM N/A 11/04/2014   Procedure: LEFT HEART CATHETERIZATION WITH CORONARY ANGIOGRAM;  Surgeon: Troy Sine, MD;  Location: [facility] CATH LAB;  Service: Cardiovascular;  Laterality: N/A;  . TEE WITHOUT CARDIOVERSION N/A 11/14/2014   Procedure: TRANSESOPHAGEAL ECHOCARDIOGRAM (TEE);  Surgeon: [name], MD;   Location: [place];  Service: Open Heart Surgery;  Laterality: N/A;  . TEE WITHOUT CARDIOVERSION N/A 12/25/2017   Procedure: TRANSESOPHAGEAL ECHOCARDIOGRAM (TEE);  Surgeon: [name], MD;  Location: [facility] ENDOSCOPY;  Service: Cardiovascular;  Laterality: N/A;  . TEMPORARY PACEMAKER INSERTION  11/04/2014   Procedure: TEMPORARY PACEMAKER INSERTION;  Surgeon: Troy Sine, MD;  Location: [facility] C[facility] LAB;  Service: Cardiovascular;;  . TOOTH EXTRACTION       reports that she quit smoking about 2 years ago. Her smoking use included cigarettes. She has a 5.60 pack-year smoking history. She has never used smokeless tobacco. She reports that she does not drink alcohol or use drugs.  Allergies  Allergen Reactions  . Bee Venom Anaphylaxis  . Codeine Nausea And Vomiting  . Entresto [Sacubitril-Valsartan]     Chest pain   . Shrimp [Shellfish Allergy] Swelling  . Tomato     Pt states it gives her gout    Family History  Problem Relation Age of Onset  . Cancer Mother        LYMPHOMA  . Heart disease Father   . Other Father        TB  . CVA Sister   . Prostate cancer Brother 29  . Diabetes Brother     Prior to Admission medications   Medication Sig Start Date End Date Taking? Authorizing Provider  acetaminophen (TYLENOL) 500 MG tablet Take 500 mg by mouth every 6 (six) hours as needed for moderate pain.    Yes [provider]  allopurinol (ZYLOPRIM) 300 MG tablet Take 1 tablet (300 mg total) by mouth daily. 01/04/18  Yes Georgiana Shore, NP  atorvastatin (LIPITOR) 10 MG tablet Take 10 mg by mouth daily.   Yes [provider]  carvedilol (COREG) 3.125 MG tablet Take 1 tablet (3.125 mg total) by mouth 2 (two) times daily with a meal. 08/03/18  Yes Bensimhon, Shaune Pascal, MD  furosemide (LASIX) 20 MG tablet TAKE 3 TABLETS (60 MG TOTAL) BY MOUTH 2 (TWO) TIMES DAILY. 11/22/17  Yes Hilty, Nadean Corwin, MD  losartan (COZAAR) 25 MG tablet Take 1 tablet (25 mg total) by mouth 2 (two) times  daily. 07/17/19  Yes Bensimhon, Shaune Pascal, MD  pantoprazole (PROTONIX) 40 MG tablet Take 40 mg by mouth daily.   Yes [provider]  rivaroxaban (XARELTO) 20 MG TABS tablet Take 1 tablet (20 mg total) by mouth daily with supper. 03/02/18  Yes Bensimhon, Shaune Pascal, MD  spironolactone (ALDACTONE) 25 MG tablet Take 1 tablet (25 mg total) by mouth daily. 07/17/19 10/15/19 Yes Bensimhon, Shaune Pascal, MD  magnesium oxide (MAG-OX) 400 MG tablet Take 1 tablet (400 mg total) by mouth 3 (three) times daily. Patient not taking: Reported on 08/27/2019 12/08/17   Bensimhon, Shaune Pascal, MD    Physical Exam: Constitutional: Moderately built and nourished. Vitals:  10/09/19 0230 10/09/19 0300 10/09/19 0304 10/09/19 0315  BP: 100/72 118/60 118/60 125/63  Pulse: (!) 34 (!) 108 (!) 114 (!) 166  Resp: 16 (!) 26 19 17   Temp:   98.6 F (37 C) 98.5 F (36.9 C)  TempSrc:   Oral Oral  SpO2: 98% 99%  99%  Weight:      Height:       Eyes: Anicteric no pallor. ENMT: No discharge from the ears eyes nose or mouth. Neck: No mass felt.  No neck rigidity. Respiratory: No rhonchi or crepitations. Cardiovascular: S1-S2 heard. Abdomen: Soft nontender bowel sound present. Musculoskeletal: No edema. Skin: No rash. Neurologic: Alert awake oriented to time place and person.  Moves all extremities. Psychiatric: Appears normal normal affect.   Labs on Admission: I have personally reviewed following labs and imaging studies  CBC: Recent Labs  Lab 10/09/19 0009  WBC 9.4  HGB 7.7*  HCT 25.4*  MCV 92.7  PLT [id number]   Basic Metabolic Panel: Recent Labs  Lab 10/09/19 0009  NA 137  K 4.1  CL 107  CO2 21*  GLUCOSE 149*  BUN 29*  CREATININE 1.76*  CALCIUM 8.7*   GFR: Estimated Creatinine Clearance: 34.4 mL/min (A) (by C-G formula based on SCr of 1.76 mg/dL (H)). Liver Function Tests: No results for input(s): AST, ALT, ALKPHOS, BILITOT, PROT, ALBUMIN in the last 168 hours. No results for input(s): LIPASE,  AMYLASE in the last 168 hours. No results for input(s): AMMONIA in the last 168 hours. Coagulation Profile: Recent Labs  Lab 10/09/19 0043  INR 1.6*   Cardiac Enzymes: No results for input(s): CKTOTAL, CKMB, CKMBINDEX, TROPONINI in the last 168 hours. BNP (last 3 results) No results for input(s): PROBNP in the last 8760 hours. HbA1C: No results for input(s): HGBA1C in the last 72 hours. CBG: No results for input(s): GLUCAP in the last 168 hours. Lipid Profile: No results for input(s): CHOL, HDL, LDLCALC, TRIG, CHOLHDL, LDLDIRECT in the last 72 hours. Thyroid Function Tests: No results for input(s): TSH, T4TOTAL, FREET4, T3FREE, THYROIDAB in the last 72 hours. Anemia Panel: No results for input(s): VITAMINB12, FOLATE, FERRITIN, TIBC, IRON, RETICCTPCT in the last 72 hours. Urine analysis:    Component Value Date/Time   COLORURINE AMBER (A) 10/16/2017 0353   APPEARANCEUR CLOUDY (A) 10/16/2017 0353   LABSPEC 1.017 10/16/2017 0353   PHURINE 7.0 10/16/2017 0353   GLUCOSEU NEGATIVE 10/16/2017 0353   HGBUR NEGATIVE 10/16/2017 0353   BILIRUBINUR NEGATIVE 10/16/2017 0353   BILIRUBINUR ng 12/25/2013 1342   KETONESUR NEGATIVE 10/16/2017 0353   PROTEINUR 30 (A) 10/16/2017 0353   UROBILINOGEN 0.2 04/11/2017 1402   NITRITE NEGATIVE 10/16/2017 0353   LEUKOCYTESUR SMALL (A) 10/16/2017 0353   Sepsis Labs: @LABRCNTIP (procalcitonin:4,lacticidven:4) )No results found for this or any previous visit (from the past 240 hour(s)).   Radiological Exams on Admission: DG Chest 2 View  Result Date: 10/09/2019 CLINICAL DATA:  Atrial fibrillation, palpitations EXAM: CHEST - 2 VIEW COMPARISON:  06/11/2019 FINDINGS: Frontal and lateral views of the chest demonstrates stable postsurgical changes from median sternotomy. The cardiac silhouette is unremarkable. Stable ectasia of the thoracic aorta. Lungs are hyperinflated. No airspace disease, effusion, or pneumothorax. No acute bony abnormalities.  IMPRESSION: 1. Stable background emphysema.  No acute process. Electronically Signed   By: Randa Ngo M.D.   On: 10/09/2019 00:33    EKG: Independently reviewed.  A. fib with RVR.  Assessment/Plan Active Problems:   Chronic diastolic CHF (congestive heart failure) ([place])  Atrial fibrillation with RVR (HCC)   S/P CABG x 2   Acute GI bleeding   Acute blood loss anemia    1. Acute GI bleeding suspect likely could be upper GI given the melanotic stools.  Since patient is symptomatic with patient feeling weak over the last 1 week I have ordered 1 unit of PRBC we will keep patient n.p.o. start patient on Protonix we will also give her 250 cc normal saline bolus since blood pressures in the low normal.  Consult GI. 2. A. fib with RVR has received 2 doses of metoprolol IV 2.5 mg.  Not starting Cardizem due to low normal blood pressure.  Patient is receiving fluid and PRBC.  Will consult cardiology.  Holding Xarelto due to GI bleed. 3. CAD status post CABG has had some chest tightness initially with A. fib with RVR is improved.  Presently n.p.o. 4. Ischemic cardiomyopathy presently receiving PRBC transfusion and fluid. 5. History of COPD not actively wheezing. 6. Acute blood loss anemia recheck CBC after transfusion.  Given that patient has A. fib with RVR with acute GI bleed with patient on anticoagulant will need close monitoring for any further deterioration and will need inpatient status.  Covid test is pending.   DVT prophylaxis: SCDs. Code Status: Full code. Family Communication: Discussed with patient. Disposition Plan: Home. Consults called: Cardiology. Admission status: Inpatient.   Rise Patience MD [facility] Pager [phone].  If 7PM-7AM, please contact night-coverage [email or web address] Password [facility]  10/09/2019, 3:27 AM

## 2019-10-09 NOTE — Progress Notes (Signed)
Patient up to brush teeth- HR reached 140's, then rate came back down WNL as she began to settle down in bed.   HR increases with exertion. Explained to patient to call for help if she need to get up for anything, bed alarm on for safety.

## 2019-10-09 NOTE — ED Notes (Signed)
3E unable to take report at this time.  

## 2019-10-09 NOTE — ED Provider Notes (Signed)
[facility] EMERGENCY DEPARTMENT Provider Note   CSN: [id number] Arrival date & time: 10/08/19  2354   History Chief Complaint  Patient presents with  . Palpitations    [name] [name] is a 74 y.o. female.  The history is provided by the patient.  Palpitations She has extensive history including hypertension coronary artery disease, COPD, chronic kidney disease, paroxysmal atrial fibrillation anticoagulated on rivaroxaban.  Tonight at about 10 PM, she noted that she started going back into atrial fibrillation.  She had seen her primary care provider earlier today.  For the last 5 days, she has had black bowel movements and for the last 2 days, as noted some weakness and difficulty walking normal distances.  Her primary care provider checked her hemoglobin and noted that it was low and has referred her to gastroenterology.  She had been taking small amounts of Pepto-Bismol.  She has frequent problems with acid reflux and takes ranitidine on an as-needed basis (she states that famotidine does not help her).  She denies chest pain, dyspnea.  Past Medical History:  Diagnosis Date  . A-fib (HCC)    RVR- POST CABG- RESOLVED. S/P NSTEMI 3/16  . Acute respiratory failure ([place]) 08/2017  . Arthritis   . Asthma    ??  . Atopic dermatitis   . CAD (coronary artery disease)    a. s/p CABG 2016.  . Cardiac arrest ([place]) 10/2014   [name]  . Cardiomyopathy, ischemic    a. EF previously low, improved to LVEF 50-55% as of May 2018  . Chronic combined systolic and diastolic heart failure ([place])   . CKD (chronic kidney disease), stage III   . COPD (chronic obstructive pulmonary disease) ([place])   . Esophageal dilatation 2013  . GERD (gastroesophageal reflux disease)   . Gout   . Headache   . Hypertension   . Hypokalemia   . Idiopathic angioedema   . LGI bleed 08/06/2017   a. felt to be hemorrhoidal during that admission (no drop in Hgb).  . Lower back pain   . Persistent  atrial fibrillation (HCC)    a. h/o difficult to control rates (complicated by noncompliance), not felt to be a candidate for ablation or antiarrhythmic due to noncompliance.  . Personal history of noncompliance with medical treatment, presenting hazards to health   . Prediabetes   . S/P CABG x 2 with clipping of LA appendage 11/14/2014   LIMA to LAD, SVG to OM, EVH via right thigh  . Urine incontinence   . Uterine fibroid     Patient Active Problem List   Diagnosis Date Noted  . Postmenopausal vaginal bleeding 01/04/2018  . Snoring 01/04/2018  . Acute on chronic combined systolic and diastolic CHF (congestive heart failure) ([place])   . CHF exacerbation ([place]) 10/16/2017  . Noncompliance 10/16/2017  . Acute on chronic congestive heart failure ([place])   . Influenza with respiratory manifestation flu a + 09/14/16 started on tamiflu 09/15/17 09/19/2017  . Thrombocytopenia ([place]) 09/14/2017  . Acute drug-induced gout of right foot   . Medication noncompliance due to cognitive impairment 09/06/2017  . Acute diastolic CHF (congestive heart failure) ([place]) 08/06/2017  . LGI bleed, likely hemorrhoids 08/06/2017  . Atrial fibrillation ([place]) 08/04/2017  . Paroxysmal atrial fibrillation ([place]) 02/08/2017  . Cardiomyopathy, ischemic 02/08/2017  . Dysphagia 02/08/2017  . CKD (chronic kidney disease), stage III 01/30/2017  . Pain in shoulder 02/08/2016  . Breast pain, left 02/08/2016  . Bilateral arm numbness and  tingling while sleeping 02/08/2016  . Painful lumpy left breast 09/23/2015  . [place] intertrigo 02/11/2015  . S/P CABG x 2 11/14/2014  . Accelerated hypertension   . Cardiac arrest ([place]) 11/04/2014  . Left main coronary artery disease 11/04/2014  . Coronary artery disease due to lipid rich plaque   . Acute respiratory failure with hypoxemia ([place])   . Essential hypertension   . Atrial fibrillation with RVR ([place])   . Hypokalemia 10/30/2014  . Chronic diastolic CHF (congestive heart failure)  ([place]) 10/30/2014  . NSTEMI (non-ST elevated myocardial infarction) ([place])   . DOE (dyspnea on exertion)   . CAP (community acquired pneumonia) 10/29/2014    Past Surgical History:  Procedure Laterality Date  . CARDIOVERSION N/A 11/18/2014   Procedure: CARDIOVERSION;  Surgeon: [name], MD;  Location: [facility];  Service: Cardiovascular;  Laterality: N/A;  . CARDIOVERSION N/A 12/25/2017   Procedure: CARDIOVERSION;  Surgeon: [name], MD;  Location: [facility];  Service: Cardiovascular;  Laterality: N/A;  . CLIPPING OF ATRIAL APPENDAGE N/A 11/14/2014   Procedure: CLIPPING OF ATRIAL APPENDAGE;  Surgeon: [name], MD;  Location: [facility];  Service: Open Heart Surgery;  Laterality: N/A;  . COLONOSCOPY  2013  . CORONARY ARTERY BYPASS GRAFT N/A 11/14/2014   Procedure: CORONARY ARTERY BYPASS GRAFTING (CABG)TIMES 2 USING LEFT INTERNAL MAMMARY ARTERY AND RIGHT SAPHENOUS VEIN HARVESTED ENDOSCOPICALLY;  Surgeon: [name], MD;  Location: [facility];  Service: Open Heart Surgery;  Laterality: N/A;  . LEFT HEART CATHETERIZATION WITH CORONARY ANGIOGRAM N/A 11/04/2014   Procedure: LEFT HEART CATHETERIZATION WITH CORONARY ANGIOGRAM;  Surgeon: Troy Sine, MD;  Location: [facility] CATH LAB;  Service: Cardiovascular;  Laterality: N/A;  . TEE WITHOUT CARDIOVERSION N/A 11/14/2014   Procedure: TRANSESOPHAGEAL ECHOCARDIOGRAM (TEE);  Surgeon: [name], MD;  Location: [facility];  Service: Open Heart Surgery;  Laterality: N/A;  . TEE WITHOUT CARDIOVERSION N/A 12/25/2017   Procedure: TRANSESOPHAGEAL ECHOCARDIOGRAM (TEE);  Surgeon: [name], MD;  Location[facility] ENDOSCOPY;  Service: Cardiovascular;  Laterality: N/A;  . TEMPORARY PACEMAKER INSERTION  11/04/2014   Procedure: TEMPORARY PACEMAKER INSERTION;  Surgeon: Troy Sine, MD;  Location: [facility] CATH LAB;  Service: Cardiovascular;;  . TOOTH EXTRACTION       OB History   No obstetric history on file.     Family History  Problem Relation Age of  Onset  . Cancer Mother        LYMPHOMA  . Heart disease Father   . Other Father        TB  . CVA Sister   . Prostate cancer Brother 63  . Diabetes Brother     Social History   Tobacco Use  . Smoking status: Former Smoker    Packs/day: 0.10    Years: 56.00    Pack years: 5.60    Types: Cigarettes    Quit date: 2019    Years since quitting: 2.1  . Smokeless tobacco: Never Used  Substance Use Topics  . Alcohol use: No    Alcohol/week: 0.0 standard drinks  . Drug use: No    Home Medications Prior to Admission medications   Medication Sig Start Date End Date Taking? Authorizing Provider  acetaminophen (TYLENOL) 500 MG tablet Take 500 mg by mouth every 6 (six) hours as needed for moderate pain.     [provider]  allopurinol (ZYLOPRIM) 300 MG tablet Take 1 tablet (300 mg total) by mouth daily. 01/04/18   Tamala Julian,  Renato Gails, NP  atorvastatin (LIPITOR) 10 MG tablet Take 10 mg by mouth daily.    [provider]  carvedilol (COREG) 3.125 MG tablet Take 1 tablet (3.125 mg total) by mouth 2 (two) times daily with a meal. 08/03/18   Bensimhon, Shaune Pascal, MD  furosemide (LASIX) 20 MG tablet TAKE 3 TABLETS (60 MG TOTAL) BY MOUTH 2 (TWO) TIMES DAILY. 11/22/17   Hilty, Nadean Corwin, MD  losartan (COZAAR) 25 MG tablet Take 1 tablet (25 mg total) by mouth 2 (two) times daily. 07/17/19   Bensimhon, Shaune Pascal, MD  magnesium oxide (MAG-OX) 400 MG tablet Take 1 tablet (400 mg total) by mouth 3 (three) times daily. Patient not taking: Reported on 08/27/2019 12/08/17   Bensimhon, Shaune Pascal, MD  pantoprazole (PROTONIX) 40 MG tablet Take 40 mg by mouth daily.    [provider]  rivaroxaban (XARELTO) 20 MG TABS tablet Take 1 tablet (20 mg total) by mouth daily with supper. 03/02/18   Bensimhon, Shaune Pascal, MD  spironolactone (ALDACTONE) 25 MG tablet Take 1 tablet (25 mg total) by mouth daily. 07/17/19 10/15/19  Bensimhon, Shaune Pascal, MD    Allergies    Bee venom, Codeine, Entresto  [sacubitril-valsartan], Shrimp [shellfish allergy], and Tomato  Review of Systems   Review of Systems  Cardiovascular: Positive for palpitations.  All other systems reviewed and are negative.   Physical Exam Updated Vital Signs BP 120/83 (BP Location: Right Arm)   Pulse (!) 115   Temp 99.2 F (37.3 C) (Oral)   Resp 18   Ht 5\' 7"  (1.702 m)   Wt 102.1 kg   SpO2 99%   BMI 35.24 kg/m   Physical Exam Vitals and nursing note reviewed.   74 year old female, resting comfortably and in no acute distress. Vital signs are significant for rapid heart rate. Oxygen saturation is 99%, which is normal. Head is normocephalic and atraumatic. PERRLA, EOMI. Oropharynx is clear. Neck is nontender and supple without adenopathy or JVD. Back is nontender and there is no CVA tenderness. Lungs are clear without rales, wheezes, or rhonchi. Chest is nontender. Heart is tachycardic and irregular without murmur. Abdomen is soft, flat, nontender without masses or hepatosplenomegaly and peristalsis is normoactive. Rectal: Normal sphincter tone.  Small amount of greenish-black stool which is Hemoccult positive. Extremities have no cyanosis or edema, full range of motion is present. Skin is warm and dry without rash. Neurologic: Mental status is normal, cranial nerves are intact, there are no motor or sensory deficits.  ED Results / Procedures / Treatments   Labs (all labs ordered are listed, but only abnormal results are displayed) Labs Reviewed  BASIC METABOLIC PANEL - Abnormal; Notable for the following components:      Result Value   CO2 21 (*)    Glucose, Bld 149 (*)    BUN 29 (*)    Creatinine, Ser 1.76 (*)    Calcium 8.7 (*)    GFR calc non Af Amer 28 (*)    GFR calc Af Amer 32 (*)    All other components within normal limits  CBC - Abnormal; Notable for the following components:   RBC 2.74 (*)    Hemoglobin 7.7 (*)    HCT 25.4 (*)    RDW 16.6 (*)    All other components within normal  limits  PROTIME-INR - Abnormal; Notable for the following components:   Prothrombin Time 18.5 (*)    INR 1.6 (*)    All other  components within normal limits  APTT - Abnormal; Notable for the following components:   aPTT 37 (*)    All other components within normal limits  POC OCCULT BLOOD, ED - Abnormal; Notable for the following components:   Fecal Occult Bld POSITIVE (*)    All other components within normal limits  SARS CORONAVIRUS 2 (TAT 6-24 HRS)  BASIC METABOLIC PANEL  CBC  TYPE AND SCREEN  PREPARE RBC (CROSSMATCH)  TROPONIN I (HIGH SENSITIVITY)  TROPONIN I (HIGH SENSITIVITY)    EKG EKG Interpretation  Date/Time:  Wednesday October 09 2019 00:00:19 EST Ventricular Rate:  133 PR Interval:    QRS Duration: 101 QT Interval:  291 QTC Calculation: 433 R Axis:   39 Text Interpretation: Atrial fibrillation with rapid ventricular response Repol abnrm suggests ischemia, lateral leads - probabaly rate related No old tracing to compare Confirmed by Delora Fuel ([id number]) on 10/09/2019 12:09:19 AM   Radiology DG Chest 2 View  Result Date: 10/09/2019 CLINICAL DATA:  Atrial fibrillation, palpitations EXAM: CHEST - 2 VIEW COMPARISON:  06/11/2019 FINDINGS: Frontal and lateral views of the chest demonstrates stable postsurgical changes from median sternotomy. The cardiac silhouette is unremarkable. Stable ectasia of the thoracic aorta. Lungs are hyperinflated. No airspace disease, effusion, or pneumothorax. No acute bony abnormalities. IMPRESSION: 1. Stable background emphysema.  No acute process. Electronically Signed   By: Randa Ngo M.D.   On: 10/09/2019 00:33    Procedures Procedures  CRITICAL CARE Performed by: Delora Fuel Total critical care time: 60 minutes Critical care time was exclusive of separately billable procedures and treating other patients. Critical care was necessary to treat or prevent imminent or life-threatening deterioration. Critical care was time spent  personally by me on the following activities: development of treatment plan with patient and/or surrogate as well as nursing, discussions with consultants, evaluation of patient's response to treatment, examination of patient, obtaining history from patient or surrogate, ordering and performing treatments and interventions, ordering and review of laboratory studies, ordering and review of radiographic studies, pulse oximetry and re-evaluation of patient's condition.  Medications Ordered in ED Medications  sodium chloride 0.9 % bolus 250 mL (has no administration in time range)  acetaminophen (TYLENOL) tablet 650 mg (has no administration in time range)    Or  acetaminophen (TYLENOL) suppository 650 mg (has no administration in time range)  pantoprazole (PROTONIX) 80 mg in sodium chloride 0.9 % 100 mL IVPB (has no administration in time range)  pantoprazole (PROTONIX) 80 mg in sodium chloride 0.9 % 250 mL (0.32 mg/mL) infusion (has no administration in time range)  pantoprazole (PROTONIX) injection 40 mg (has no administration in time range)  metoprolol tartrate (LOPRESSOR) injection 2.5 mg (2.5 mg Intravenous Given 10/09/19 0050)  metoprolol tartrate (LOPRESSOR) injection 2.5 mg (2.5 mg Intravenous Given 10/09/19 0303)  0.9 %  sodium chloride infusion (Manually program via Guardrails IV Fluids) (10 mL/hr Intravenous New Bag/Given 10/09/19 0307)    ED Course  I have reviewed the triage vital signs and the nursing notes.  Pertinent labs & imaging results that were available during my care of the patient were reviewed by me and considered in my medical decision making (see chart for details).  MDM Rules/Calculators/A&P GI bleeding in patient who is anticoagulated.  Paroxysmal atrial fibrillation.  I have recommended cardioversion but patient is reluctant to proceed with this.  She will be given metoprolol for rate control.  Old records are reviewed confirming longstanding paroxysmal atrial  fibrillation.  There was little response  from intravenous metoprolol and she is given additional metoprolol.  Orthostatic vital signs did show significant jump in heart rate.  Hemoglobin is 7.7 compared with 14.3 on 10/20/2017.  There is also moderate renal insufficiency with a slight jump in creatinine.  Today, creatinine is 1.76 compared with 1.45 on 08/27/2019.  Blood is sent for type and screen.  Case is discussed with Dr. [name] of [facility], who agrees to admit the patient.  Final Clinical Impression(s) / ED Diagnoses Final diagnoses:  Gastrointestinal hemorrhage with melena  Anemia due to acute blood loss  Paroxysmal atrial fibrillation with rapid ventricular response [facility])  Renal insufficiency    Rx / DC Orders ED Discharge Orders    None       Delora Fuel, MD [id number] [phone]

## 2019-10-09 NOTE — ED Notes (Signed)
250 ml NS bolus ordered by admitting provider, unable to give this at this time do to pt getting a unit of PRBC at this moment, provider notified.

## 2019-10-09 NOTE — Progress Notes (Signed)
[name] [name]  [id number] DOB: [birth year] DOA: 10/08/2019 PCP: [name], MD   Brief Narrative:  Patient is a 43 female with history of coronary disease status post CABG, ischemic cardiomyopathy, paroxysmal A. fib, COPD who presents to the emergency department with complaints of palpitations.  She was also having black tarry stools since last several days.  She was seen by her PCP recently and was found to be anemic.  She was taking Xarelto for anticoagulation for A. Fib.  On presentation, she was found to be in A. fib with RVR with hemoglobin of 7.  Patient was started on Cardizem drip for A. fib with RVR.  Started on Protonix drip.  FOBT was positive.  GI consulted today.  Assessment & Plan:   Active Problems:   Chronic diastolic CHF (congestive heart failure) (HCC)   Atrial fibrillation with RVR (HCC)   S/P CABG x 2   Acute GI bleeding   Acute blood loss anemia   Upper GI bleed: Presented with melanotic stools.  FOBT positive her hemoglobin was in the range of 14 in 2019.  Was taking Xarelto. Continue clear liquid diet for today.  N.p.o. after midnight.  Continue Protonix drip.  Presented with hemoglobin in the range of 7. given a unit of PRBC.  Hemoglobin this morning the range of 9.  A. fib with RVR: Presented with RVR.  Started on Cardizem drip.  She follows with Dr. Tempie Hoist  Coronary artery disease: Status post CABG.  Denies any chest pain  Ischemic cardiomyopathy: Last echocardiogram done in 07/2018 showed ejection fraction of 30 to 35%.  Follows with heart failure team.  Currently euvolemic  History of COPD: Respiratory status currently stable.  No wheezing  AKI on CKD stage II: Baseline creatinine around 1.4-1.5.  Currently kidney function is at baseline.          DVT prophylaxis:SCD Code Status: Full code Family Communication: None present at the bedside Disposition Plan: Patient is from home.  Expected discharge to home when clinically  stable.  Waiting for intervention from GI   Consultants: GI, cardiology  Procedures: None  Antimicrobials:  Anti-infectives (From admission, onward)   None      Subjective: Patient seen and examined at the bedside this morning.  Hemodynamically stable.  Denies any abdominal pain, nausea or vomiting .not having  hematemesis or hematochezia.  Objective: Vitals:   10/09/19 1100 10/09/19 1130 10/09/19 1220 10/09/19 1230  BP: (!) 126/110 128/69 (!) 96/58 117/75  Pulse: (!) 130 (!) 45 (!) 113 (!) 106  Resp: (!) 23 16 (!) 25 (!) 23  Temp:      TempSrc:      SpO2: 97% 100% 98% 100%  Weight:      Height:        Intake/Output Summary (Last 24 hours) at 10/09/2019 1352 Last data filed at 10/09/2019 [id number] Gross per 24 hour  Intake 1066.53 ml  Output --  Net 1066.53 ml   Filed Weights   10/08/19 2357  Weight: 102.1 kg    Examination:  General exam: Not in distress,obese HEENT:PERRL,Oral mucosa moist, Ear/Nose normal on gross exam Respiratory system: Bilateral equal air entry, normal vesicular breath sounds, no wheezes or crackles  Cardiovascular system:Afib with RVR. No JVD, murmurs, rubs, gallops or clicks. Trace pedal edema. Gastrointestinal system: Abdomen is nondistended, soft and nontender. No organomegaly or masses felt. Normal bowel sounds heard. Central nervous system: Alert and oriented. No focal neurological deficits. Extremities:Trace pedal edema,  no clubbing ,no cyanosis, distal peripheral pulses palpable. Skin: No rashes, lesions or ulcers,no icterus ,no pallor     Data Reviewed: I have personally reviewed following labs and imaging studies  CBC: Recent Labs  Lab 10/09/19 0009 10/09/19 0743  WBC 9.4 10.4  HGB 7.7* 9.3*  HCT 25.4* 29.8*  MCV 92.7 91.1  PLT 192 0000000   Basic Metabolic Panel: Recent Labs  Lab 10/09/19 0009 10/09/19 0743  NA 137 139  K 4.1 4.5  CL 107 109  CO2 21* 18*  GLUCOSE 149* 109*  BUN 29* 25*  CREATININE 1.76* 1.46*   CALCIUM 8.7* 8.8*   GFR: Estimated Creatinine Clearance: 41.5 mL/min (A) (by C-G formula based on SCr of 1.46 mg/dL (H)). Liver Function Tests: No results for input(s): AST, ALT, ALKPHOS, BILITOT, PROT, ALBUMIN in the last 168 hours. No results for input(s): LIPASE, AMYLASE in the last 168 hours. No results for input(s): AMMONIA in the last 168 hours. Coagulation Profile: Recent Labs  Lab 10/09/19 0043  INR 1.6*   Cardiac Enzymes: No results for input(s): CKTOTAL, CKMB, CKMBINDEX, TROPONINI in the last 168 hours. BNP (last 3 results) No results for input(s): PROBNP in the last 8760 hours. HbA1C: No results for input(s): HGBA1C in the last 72 hours. CBG: Recent Labs  Lab 10/09/19 0821 10/09/19 1335  GLUCAP 125* 112*   Lipid Profile: No results for input(s): CHOL, HDL, LDLCALC, TRIG, CHOLHDL, LDLDIRECT in the last 72 hours. Thyroid Function Tests: No results for input(s): TSH, T4TOTAL, FREET4, T3FREE, THYROIDAB in the last 72 hours. Anemia Panel: No results for input(s): VITAMINB12, FOLATE, FERRITIN, TIBC, IRON, RETICCTPCT in the last 72 hours. Sepsis Labs: No results for input(s): PROCALCITON, LATICACIDVEN in the last 168 hours.  Recent Results (from the past 240 hour(s))  SARS CORONAVIRUS 2 (TAT 6-24 HRS) Nasopharyngeal Nasopharyngeal Swab     Status: None   Collection Time: 10/09/19  1:38 AM   Specimen: Nasopharyngeal Swab  Result Value Ref Range Status   SARS Coronavirus 2 NEGATIVE NEGATIVE Final    Comment: (NOTE) SARS-CoV-2 target nucleic acids are NOT DETECTED. The SARS-CoV-2 RNA is generally detectable in upper and lower respiratory specimens during the acute phase of infection. Negative results do not preclude SARS-CoV-2 infection, do not rule out co-infections with other pathogens, and should not be used as the sole basis for treatment or other patient management decisions. Negative results must be combined with clinical observations, patient history, and  epidemiological information. The expected result is Negative. Fact Sheet for Patients: SugarRoll.be Fact Sheet for Healthcare Providers: https://www.woods-mathews.com/ This test is not yet approved or cleared by the [place] FDA and  has been authorized for detection and/or diagnosis of SARS-CoV-2 by FDA under an Emergency Use Authorization (EUA). This EUA will remain  in effect (meaning this test can be used) for the duration of the COVID-19 declaration under Section 56 4(b)(1) of the Act, 21 U.S.C. section 360bbb-3(b)(1), unless the authorization is terminated or revoked sooner. Performed at [facility], [place] [address], [place], [place] [address]          Radiology Studies: DG Chest 2 View  Result Date: 10/09/2019 CLINICAL DATA:  Atrial fibrillation, palpitations EXAM: CHEST - 2 VIEW COMPARISON:  06/11/2019 FINDINGS: Frontal and lateral views of the chest demonstrates stable postsurgical changes from median sternotomy. The cardiac silhouette is unremarkable. Stable ectasia of the thoracic aorta. Lungs are hyperinflated. No airspace disease, effusion, or pneumothorax. No acute bony abnormalities. IMPRESSION: 1. Stable background  emphysema.  No acute process. Electronically Signed   By: Randa Ngo M.D.   On: 10/09/2019 00:33        Scheduled Meds: . [START ON 10/12/2019] pantoprazole  40 mg Intravenous Q12H   Continuous Infusions: . dilTIAZem HCl-Dextrose 12.5 mg/hr (10/09/19 1257)  . pantoprozole (PROTONIX) infusion 8 mg/hr (10/09/19 0607)     LOS: 0 days    Time spent:25 mins. More than 50% of that time was spent in counseling and/or coordination of care.      Shelly Coss, MD [facility] P2/24/2021, 1:52 PM

## 2019-10-10 ENCOUNTER — Encounter (HOSPITAL_COMMUNITY)
Admission: EM | Disposition: A | Discharge status: Home / Self Care | Payer: Self-pay | Source: Home / Self Care | Attending: Internal Medicine

## 2019-10-10 ENCOUNTER — Inpatient Hospital Stay (HOSPITAL_COMMUNITY): Payer: HMO | Admitting: Certified Registered"

## 2019-10-10 ENCOUNTER — Inpatient Hospital Stay (HOSPITAL_COMMUNITY): Payer: HMO

## 2019-10-10 ENCOUNTER — Encounter (HOSPITAL_COMMUNITY): Payer: Self-pay | Admitting: Internal Medicine

## 2019-10-10 DIAGNOSIS — D62 Acute posthemorrhagic anemia: Secondary | ICD-10-CM

## 2019-10-10 DIAGNOSIS — R9431 Abnormal electrocardiogram [ECG] [EKG]: Secondary | ICD-10-CM

## 2019-10-10 HISTORY — PX: BALLOON DILATION: SHX5330

## 2019-10-10 HISTORY — PX: BIOPSY: SHX5522

## 2019-10-10 HISTORY — PX: ESOPHAGOGASTRODUODENOSCOPY (EGD) WITH PROPOFOL: SHX5813

## 2019-10-10 LAB — CBC WITH DIFFERENTIAL/PLATELET
Abs Immature Granulocytes: 0.06 10*3/uL (ref 0.00–0.07)
Basophils Absolute: 0 10*3/uL (ref 0.0–0.1)
Basophils Relative: 0 %
Eosinophils Absolute: 0.2 10*3/uL (ref 0.0–0.5)
Eosinophils Relative: 2 %
HCT: 31.2 % — ABNORMAL LOW (ref 36.0–46.0)
Hemoglobin: 9.8 g/dL — ABNORMAL LOW (ref 12.0–15.0)
Immature Granulocytes: 1 %
Lymphocytes Relative: 17 %
Lymphs Abs: 1.6 10*3/uL (ref 0.7–4.0)
MCH: 28.6 pg (ref 26.0–34.0)
MCHC: 31.4 g/dL (ref 30.0–36.0)
MCV: 91 fL (ref 80.0–100.0)
Monocytes Absolute: 0.5 10*3/uL (ref 0.1–1.0)
Monocytes Relative: 6 %
Neutro Abs: 7 10*3/uL (ref 1.7–7.7)
Neutrophils Relative %: 74 %
Platelets: 194 10*3/uL (ref 150–400)
RBC: 3.43 MIL/uL — ABNORMAL LOW (ref 3.87–5.11)
RDW: 16.6 % — ABNORMAL HIGH (ref 11.5–15.5)
WBC: 9.5 10*3/uL (ref 4.0–10.5)
nRBC: 0.2 % (ref 0.0–0.2)

## 2019-10-10 LAB — BPAM RBC
Blood Product Expiration Date: 202102252359
ISSUE DATE / TIME: 202102240254
Unit Type and Rh: 9500

## 2019-10-10 LAB — TYPE AND SCREEN
ABO/RH(D): A POS
Antibody Screen: NEGATIVE
Unit division: 0

## 2019-10-10 LAB — BASIC METABOLIC PANEL
Anion gap: 10 (ref 5–15)
BUN: 19 mg/dL (ref 8–23)
CO2: 19 mmol/L — ABNORMAL LOW (ref 22–32)
Calcium: 8.8 mg/dL — ABNORMAL LOW (ref 8.9–10.3)
Chloride: 110 mmol/L (ref 98–111)
Creatinine, Ser: 1.45 mg/dL — ABNORMAL HIGH (ref 0.44–1.00)
GFR calc Af Amer: 41 mL/min — ABNORMAL LOW (ref >60)
GFR calc non Af Amer: 35 mL/min — ABNORMAL LOW (ref >60)
Glucose, Bld: 120 mg/dL — ABNORMAL HIGH (ref 70–99)
Potassium: 4.7 mmol/L (ref 3.5–5.1)
Sodium: 139 mmol/L (ref 135–145)

## 2019-10-10 LAB — GLUCOSE, CAPILLARY
Glucose-Capillary: 112 mg/dL — ABNORMAL HIGH (ref 70–99)
Glucose-Capillary: 121 mg/dL — ABNORMAL HIGH (ref 70–99)
Glucose-Capillary: 95 mg/dL (ref 70–99)

## 2019-10-10 LAB — ECHOCARDIOGRAM COMPLETE
Height: 67 in
Weight: 3654.4 oz

## 2019-10-10 SURGERY — ESOPHAGOGASTRODUODENOSCOPY (EGD) WITH PROPOFOL
Anesthesia: Monitor Anesthesia Care

## 2019-10-10 MED ORDER — PROPOFOL 10 MG/ML IV BOLUS
INTRAVENOUS | Status: DC | PRN
  Administered 2019-10-10: 30 mg via INTRAVENOUS
  Administered 2019-10-10: 20 mg via INTRAVENOUS
  Administered 2019-10-10: 30 mg via INTRAVENOUS

## 2019-10-10 MED ORDER — PEG 3350-KCL-NA BICARB-NACL 420 G PO SOLR
4000.0000 mL | Freq: Once | ORAL | Status: AC
  Administered 2019-10-10: 4000 mL via ORAL
  Filled 2019-10-10: qty 4000

## 2019-10-10 MED ORDER — SODIUM CHLORIDE 0.9 % IV SOLN: INTRAVENOUS | Status: DC

## 2019-10-10 MED ORDER — PROPOFOL 1000 MG/100ML IV EMUL
INTRAVENOUS | Status: AC
  Filled 2019-10-10: qty 200

## 2019-10-10 MED ORDER — PANTOPRAZOLE SODIUM 40 MG PO TBEC
40.0000 mg | DELAYED_RELEASE_TABLET | Freq: Every day | ORAL | Status: DC
  Administered 2019-10-10 – 2019-10-11 (×2): 40 mg via ORAL
  Filled 2019-10-10 (×2): qty 1

## 2019-10-10 MED ORDER — PROPOFOL 500 MG/50ML IV EMUL
INTRAVENOUS | Status: DC | PRN
  Administered 2019-10-10: 75 ug/kg/min via INTRAVENOUS

## 2019-10-10 MED ORDER — LIDOCAINE 2% (20 MG/ML) 5 ML SYRINGE
INTRAMUSCULAR | Status: DC | PRN
  Administered 2019-10-10: 100 mg via INTRAVENOUS

## 2019-10-10 SURGICAL SUPPLY — 15 items

## 2019-10-10 NOTE — Progress Notes (Signed)
Patient received back to 3 east room 15,her EGD was negative for acute bleeding,she had a few polyps that were biopsied,she has had esophageal stenosis which she had a dilatation done. They are planning to do lower endoscopy in the am. I have ordered her a diet this time from kitchen. She denies pain or distress vs are stable.

## 2019-10-10 NOTE — Anesthesia Postprocedure Evaluation (Signed)
Anesthesia Post Note  Patient: [name] [name]  Procedure(s) Performed: ESOPHAGOGASTRODUODENOSCOPY (EGD) WITH PROPOFOL (N/A ) BALLOON DILATION (N/A ) BIOPSY     Patient location during evaluation: Endoscopy Anesthesia Type: MAC Level of consciousness: awake Pain management: pain level controlled Vital Signs Assessment: post-procedure vital signs reviewed and stable Respiratory status: spontaneous breathing Cardiovascular status: stable Postop Assessment: no apparent nausea or vomiting Anesthetic complications: no    Last Vitals:  Vitals:   10/10/19 1235 10/10/19 1256  BP: (!) 142/49 124/69  Pulse: 67 63  Resp: (!) 23 20  Temp:    SpO2: 100% 100%    Last Pain:  Vitals:   10/10/19 1256  TempSrc:   PainSc: 0-No pain                 Elbony Mcclimans

## 2019-10-10 NOTE — Anesthesia Preprocedure Evaluation (Signed)
Anesthesia Evaluation  Patient identified by MRN, date of birth, ID band Patient awake    Reviewed: Allergy & Precautions, NPO status , Patient's Chart, lab work & pertinent test results  Airway Mallampati: II  TM Distance: >3 FB     Dental   Pulmonary pneumonia, former smoker,    breath sounds clear to auscultation       Cardiovascular hypertension, + CAD, + Past MI, +CHF and + DOE   Rhythm:Regular Rate:Normal     Neuro/Psych    GI/Hepatic Neg liver ROS, GERD  ,  Endo/Other  negative endocrine ROS  Renal/GU Renal disease     Musculoskeletal   Abdominal   Peds  Hematology  (+) anemia ,   Anesthesia Other Findings   Reproductive/Obstetrics                             Anesthesia Physical Anesthesia Plan  ASA: III  Anesthesia Plan: MAC   Post-op Pain Management:    Induction: Intravenous  PONV Risk Score and Plan: 2 and Ondansetron and Midazolam  Airway Management Planned: Nasal Cannula and Simple Face Mask  Additional Equipment:   Intra-op Plan:   Post-operative Plan:   Informed Consent: I have reviewed the patients History and Physical, chart, labs and discussed the procedure including the risks, benefits and alternatives for the proposed anesthesia with the patient or authorized representative who has indicated his/her understanding and acceptance.     Dental advisory given  Plan Discussed with: CRNA, Anesthesiologist and Surgeon  Anesthesia Plan Comments:         Anesthesia Quick Evaluation

## 2019-10-10 NOTE — Anesthesia Procedure Notes (Signed)
Procedure Name: MAC Date/Time: 10/10/2019 11:43 AM Performed by: Imagene Riches, CRNA Pre-anesthesia Checklist: Patient identified, Emergency Drugs available, Suction available, Patient being monitored and Timeout performed Patient Re-evaluated:Patient Re-evaluated prior to induction Oxygen Delivery Method: Nasal cannula Preoxygenation: Pre-oxygenation with 100% oxygen Induction Type: IV induction Placement Confirmation: positive ETCO2

## 2019-10-10 NOTE — Progress Notes (Signed)
  Echocardiogram 2D Echocardiogram has been performed.  Jannett Celestine 10/10/2019, 10:20 AM

## 2019-10-10 NOTE — Progress Notes (Addendum)
Progress Note  Patient Name: [name] [name] Date of Encounter: 10/10/2019  Primary Cardiologist: [name] Furbish, MD   Subjective   No acute overnight events. Converted back to sinus rhythm last night. Complains of a headache this morning but denies any chest pain, shortness of breath, or palpitations. No more black stools.  Plan is for EGD later today.  Inpatient Medications    Scheduled Meds: . metoprolol tartrate  5 mg Intravenous Q6H  . [START ON 10/12/2019] pantoprazole  40 mg Intravenous Q12H   Continuous Infusions: . sodium chloride    . pantoprozole (PROTONIX) infusion 8 mg/hr (10/10/19 0345)   PRN Meds: acetaminophen **OR** acetaminophen   Vital Signs    Vitals:   10/10/19 0400 10/10/19 0500 10/10/19 0559 10/10/19 0802  BP: (!) 115/95  117/60 104/62  Pulse:    96  Resp:  17    Temp:    97.9 F (36.6 C)  TempSrc:    Oral  SpO2: 99% 99%    Weight:      Height:        Intake/Output Summary (Last 24 hours) at 10/10/2019 0913 Last data filed at 10/10/2019 0755 Gross per 24 hour  Intake 1390.6 ml  Output 700 ml  Net 690.6 ml   Last 3 Weights 10/10/2019 10/09/2019 10/08/2019  Weight (lbs) 228 lb 6.4 oz 226 lb 10.1 oz 225 lb  Weight (kg) 103.602 kg 102.8 kg 102.059 kg      Telemetry    Converted to normal sinus rhythm last night around 21:30. Rates mostly in the 60's to 70's. Multiple PVCs noted as well as 6 beat run of SVT. - Personally Reviewed  ECG    No new EKG tracing today. - Personally Reviewed  Physical Exam   GEN: Obese African-American female resting comfortably. No acute distress.   Neck: Supple. No JVD. Cardiac: RRR. No murmurs, rubs, or gallops.  Respiratory: Clear to auscultation bilaterally. No wheezes, rhonchi, or rales.  GI: Soft, non-distended, and non-tender. MS: No lower extremity edema. No deformity. Skin: Warm and dry. Neuro:  No focal deficits. Psych: Normal affect. Responds appropriately.   Labs    High Sensitivity Troponin:    Recent Labs  Lab 10/09/19 0009 10/09/19 0138  TROPONINIHS 10 8      Chemistry Recent Labs  Lab 10/09/19 0009 10/09/19 0743  NA 137 139  K 4.1 4.5  CL 107 109  CO2 21* 18*  GLUCOSE 149* 109*  BUN 29* 25*  CREATININE 1.76* 1.46*  CALCIUM 8.7* 8.8*  GFRNONAA 28* 35*  GFRAA 32* 41*  ANIONGAP 9 12     Hematology Recent Labs  Lab 10/09/19 0009 10/09/19 0743  WBC 9.4 10.4  RBC 2.74* 3.27*  HGB 7.7* 9.3*  HCT 25.4* 29.8*  MCV 92.7 91.1  MCH 28.1 28.4  MCHC 30.3 31.2  RDW 16.6* 16.1*  PLT 192 176    BNPNo results for input(s): BNP, PROBNP in the last 168 hours.   DDimer No results for input(s): DDIMER in the last 168 hours.   Radiology    DG Chest 2 View  Result Date: 10/09/2019 CLINICAL DATA:  Atrial fibrillation, palpitations EXAM: CHEST - 2 VIEW COMPARISON:  06/11/2019 FINDINGS: Frontal and lateral views of the chest demonstrates stable postsurgical changes from median sternotomy. The cardiac silhouette is unremarkable. Stable ectasia of the thoracic aorta. Lungs are hyperinflated. No airspace disease, effusion, or pneumothorax. No acute bony abnormalities. IMPRESSION: 1. Stable background emphysema.  No acute process. Electronically Signed  By: [name] [name] M.D.   On: 10/09/2019 00:33    Cardiac Studies   Echocardiogram 08/03/2018: Study Conclusions: - Left ventricle: The cavity size was mildly dilated. Systolic  function was moderately to severely reduced. The estimated  ejection fraction was in the range of 30% to 35%. Wall motion was  normal; there were no regional wall motion abnormalities. Doppler  parameters are consistent with abnormal left ventricular  relaxation (grade 1 diastolic dysfunction).  - Aortic valve: Transvalvular velocity was within the normal range.  There was no stenosis. There was trivial regurgitation.  - Mitral valve: There was mild regurgitation.  - Left atrium: The atrium was severely dilated.  - Right  ventricle: The cavity size was mildly dilated. Wall  thickness was normal. Systolic function was moderately to  severely reduced. RV systolic pressure (S, est): 28 mm Hg.  - Right atrium: The atrium was moderately dilated.  - Inferior vena cava: The vessel was normal in size. The  respirophasic diameter changes were blunted (< 50%).  - Pericardium, extracardiac: There was no pericardial effusion.  _______________  [name] [name] Myoview 09/07/2018:  Nuclear stress EF: 44%.  There was no ST segment deviation noted during stress.  The study is normal.  This is a low risk study.  The left ventricular ejection fraction is moderately decreased (30-44%).  Patient Profile   [name] [name] is a 74 y.o. female with a history of CAD s/p CABG x2 in 2016, chronic combined CHF/ischemic cardiomyopathy with EF of 30-35% on Echo in 2019, paroxysmal atrial fibrillation on Xarelto, COPD, hypertension, hyperlipidemia, pre-diabetes, CKD stage III, GERD, mediation non-compliance, and prior tobacco use (quit in 2016) who is being seen today for the evaluation of atrial fibrillation with RVR at the request of [name] [name].  Assessment & Plan   Atrial Fibrillation with RVR - Patient presented with atrial fibrillation with RVR in setting of GI bleed. Rates initially as high as the 170's to 180's but are now well controlled on Cardizem drip. - Converted back to sinus rhythm last night.  - Most recent Echo in 07/2018 showed LVEF of 30-35%. - Repeat Echo pending. - Potassium 4.1 and Magnesium 2.1 yesterday. - IV Cardizem switched to IV Lopressor given reduced EF. Can switch to PO Lopressor after EGD today. - She has previously not felt to be a good candidate for ablation or Tikosyn due to history of medication non-compliance. Patient has COPD with emphysema noted on chest x-ray, so Amiodarone is probably not a good option.  - CHA2DS2-VASc = 5 (CHF, HTN, age >87, CAD, female). Patient on Xarelto 20mg  daily at home  but this is currently on hold due to acute anemia with presumed upper GI bleeding and hemoglobin of 7.7. Restart when OK with GI.  CAD s/p CABG in 2016  - History of CABG x2 (LIMA to LAD and SVG to OM) in 2016. Low risk Myoview in 08/2018. - EKG shows T wave inversion in leads I I and aVL (which are not new) and mild ST depression in leads II and V4-V6. ST depression may be secondary to tachycardia. Will repeat EKG now that she is back in normal sinus rhythm.  - High-sensitivity troponin negative x2.  - No angina. - Repeat Echo pending. - No additional ischemic work-up planned at this time with acute GI bleed.  Chronic Combined CHF/Ishcemic Cardiomyopathy - Most recent Echo in 07/2018 showed LVEF of 30-35% with normal wall motion, grade 1 diastolic dysfunction, and mild MR. RV was also noted to be  mildly dilated with moderately to severely reduced systolic function. - Patient appears euvolemic on exam.  - Home Lasix currently on hold. Will need to watch volume status closely after transfusion. - Will hold home Coreg, Losartan, and Spirnolactone right now given soft BP at times.  - Monitor daily weight, strict I/O's, and renal function.  Acute Anemia/ Presumed Upper GI Bleed - Hemoglobin 7.7 on presentation. She reports melena for the past several days. - Hemoccult positive.  - S/p 1 unit of PRBC. Hemoglobin stable at 9.8 today. - GI consulted and plan is for EGD later today. - Other than recurrence of atrial fibrillation, patient stable from a cardiac standpoint (no recent angina or acute CHF symptoms) and would be at acceptable risk for EGD/colonscopy if needed.   Hypertension - BP has been soft at times with systolic BP as low as 87. Most recent BP 104/62 - Continue IV Lopressor as above.  - Home Coreg, Losartan, and Spironolactone currently on hold to allow for more rate control.  Hyperlipidemia - Continue home Lipitor 10mg  daily.  Acute on Chronic CKD Stage III - Creatinine  1.76 on admission and 1.46 on repeat. Baseline 1.3 to 1.5. - Suspect pre-renal due hypovolemia from GI bleed. - Today's BMET pending.  COPD - No active wheezing.  - Management per primary team.  For questions or updates, please contact Angelina Please consult [email or web address] for contact info under        Signed, Darreld Mclean, PA-C  10/10/2019, 9:13 AM    Currently a little nervous preparing for EGD.  No chest pain no shortness of breath.  Comfortable laying flat.  As stated above, she converted back to normal rhythm.  GEN: Well nourished, well developed, in no acute distress  HEENT: normal  Neck: no JVD, carotid bruits, or masses Cardiac: RRR; no murmurs, rubs, or gallops,no edema  Respiratory:  clear to auscultation bilaterally, normal work of breathing GI: soft, nontender, nondistended, + BS MS: no deformity or atrophy  Skin: warm and dry, no rash Neuro:  Alert and Oriented x 3, Strength and sensation are intact Psych: euthymic mood, full affect  Last EF 35% on echo.  Repeating echocardiogram.  Assessment and plan: 74 year old with coronary disease CABG ischemic cardiomyopathy EF 30% paroxysmal atrial fibrillation on Xarelto which is currently on hold secondary to possible GI bleed, anemia hemoglobin 7 range.  -Await EGD -Echocardiogram pending -We will switch to p.o. metoprolol after EGD -Previously not felt to be a good candidate for antiarrhythmics. -At some point resume anticoagulation when felt comfortable from a GI perspective.  [name] Furbish, MD

## 2019-10-10 NOTE — Op Note (Signed)
[facility] Patient Name: [name] [name] Procedure Date : 10/10/2019 MRN: [id number] Attending MD: Otis Brace , MD Date of Birth: 02-05-1946 CSN: [id number] Age: 74 Admit Type: Inpatient Procedure:                Upper GI endoscopy Indications:              Dysphagia, Melena Providers:                Otis Brace, MD, Vista Lawman, RN, Lina Sar, Loletta Parish CRNA Referring MD:              Medicines:                Sedation Administered by an Anesthesia Professional Complications:            No immediate complications. Estimated Blood Loss:     Estimated blood loss was minimal. Procedure:                Pre-Anesthesia Assessment:                           - Prior to the procedure, a History and Physical                            was performed, and patient medications and                            allergies were reviewed. [name] tolerance of                            previous anesthesia was also reviewed. The risks                            and benefits of the procedure and the sedation                            options and risks were discussed with the patient.                            All questions were answered, and informed consent                            was obtained. Prior Anticoagulants: The patient has                            taken no previous anticoagulant or antiplatelet                            agents. ASA Grade Assessment: III - A patient with                            severe systemic disease. After reviewing the risks  and benefits, the patient was deemed in                            satisfactory condition to undergo the procedure.                           After obtaining informed consent, the endoscope was                            passed under direct vision. Throughout the                            procedure, the patient's blood pressure, pulse, and                         oxygen saturations were monitored continuously. The                            [id number] Olympus gastroscope was                            introduced through the mouth, and advanced to the                            second part of duodenum. The upper GI endoscopy was                            accomplished without difficulty. The patient                            tolerated the procedure well. Scope In: Scope Out: Findings:      One benign-appearing, intrinsic mild stenosis was found at the       gastroesophageal junction at 35 cm . The stenosis was traversed. A TTS       dilator was passed through the scope. Dilation with a 15-16.5-18 mm       balloon dilator was performed to 16.5 mm. The dilation site was examined       following endoscope reinsertion and showed no perforation. There was       small superficial tear at distal esophagus.      A 6 cm hiatal hernia was present.      Normal mucosa was found in the entire examined stomach. Biopsies were       taken with a cold forceps for histology.      A single small sessile polyp with no stigmata of recent bleeding was       found in the cardia. The polyp was removed with a cold biopsy forceps.       Resection and retrieval were complete.      The cardia and gastric fundus were normal on retroflexion.      A few diminutive sessile polyps were found in the duodenal bulb.       Biopsies were taken with a cold forceps for histology.      The first portion of the duodenum and second portion of the duodenum       were normal. Impression:               -  Benign-appearing esophageal stenosis. Dilated.                           - 6 cm hiatal hernia.                           - Normal mucosa was found in the entire stomach.                            Biopsied.                           - A single gastric polyp. Resected and retrieved.                           - A few duodenal polyps. Biopsied.                            - Normal first portion of the duodenum and second                            portion of the duodenum. Recommendation:           - Return patient to hospital ward for ongoing care.                           - Clear liquid diet.                           - Continue present medications.                           - Await pathology results.                           - Perform a colonoscopy at appointment to be                            scheduled. Procedure Code(s):        --- Professional ---                           [phone], Esophagogastroduodenoscopy, flexible,                            transoral; with transendoscopic balloon dilation of                            esophagus (less than 30 mm diameter)                           43239, 59, Esophagogastroduodenoscopy, flexible,                            transoral; with biopsy, single or multiple Diagnosis Code(s):        --- Professional ---  K22.2, Esophageal obstruction                           K44.9, Diaphragmatic hernia without obstruction or                            gangrene                           K31.7, Polyp of stomach and duodenum                           R13.10, Dysphagia, unspecified                           K92.1, Melena (includes Hematochezia) CPT copyright 2019 [employer]. All rights reserved. The codes documented in this report are preliminary and upon coder review may  be revised to meet current compliance requirements. Otis Brace, MD Otis Brace, MD 10/10/2019 12:14:08 PM Number of Addenda: 0

## 2019-10-10 NOTE — Plan of Care (Signed)
  Problem: Coping: Goal: Ability to identify and develop effective coping behavior will improve Outcome: Progressing   Problem: Self-Concept: Goal: Ability to identify factors that promote anxiety will improve Outcome: Progressing Goal: Level of anxiety will decrease Outcome: Progressing Goal: Ability to modify response to factors that promote anxiety will improve Outcome: Progressing   Problem: Education: Goal: Ability to identify signs and symptoms of gastrointestinal bleeding will improve Outcome: Progressing   Problem: Bowel/Gastric: Goal: Will show no signs and symptoms of gastrointestinal bleeding Outcome: Progressing   Problem: Fluid Volume: Goal: Will show no signs and symptoms of excessive bleeding Outcome: Progressing   Problem: Clinical Measurements: Goal: Complications related to the disease process, condition or treatment will be avoided or minimized Outcome: Progressing   Problem: Education: Goal: Knowledge of General Education information will improve Description: Including pain rating scale, medication(s)/side effects and non-pharmacologic comfort measures Outcome: Progressing   Problem: Clinical Measurements: Goal: Ability to maintain clinical measurements within normal limits will improve Outcome: Progressing Goal: Will remain free from infection Outcome: Progressing Goal: Diagnostic test results will improve Outcome: Progressing Goal: Respiratory complications will improve Outcome: Progressing Goal: Cardiovascular complication will be avoided Outcome: Progressing   Problem: Activity: Goal: Risk for activity intolerance will decrease Outcome: Progressing   Problem: Nutrition: Goal: Adequate nutrition will be maintained Outcome: Progressing   Problem: Elimination: Goal: Will not experience complications related to bowel motility Outcome: Progressing Goal: Will not experience complications related to urinary retention Outcome: Progressing    Problem: Pain Managment: Goal: General experience of comfort will improve Outcome: Progressing   Problem: Skin Integrity: Goal: Risk for impaired skin integrity will decrease Outcome: Progressing   Problem: Education: Goal: Ability to demonstrate management of disease process will improve Outcome: Progressing Goal: Ability to verbalize understanding of medication therapies will improve Outcome: Progressing Goal: Individualized Educational Video(s) Outcome: Progressing   Problem: Activity: Goal: Capacity to carry out activities will improve Outcome: Progressing   Problem: Cardiac: Goal: Ability to achieve and maintain adequate cardiopulmonary perfusion will improve Outcome: Progressing

## 2019-10-10 NOTE — Anesthesia Preprocedure Evaluation (Addendum)
Anesthesia Evaluation  Patient identified by MRN, date of birth, ID band Patient awake    Reviewed: Allergy & Precautions, NPO status , Patient's Chart, lab work & pertinent test results  Airway Mallampati: II  TM Distance: >3 FB Neck ROM: Full    Dental no notable dental hx. (+) Upper Dentures, Dental Advisory Given   Pulmonary pneumonia, former smoker,    Pulmonary exam normal breath sounds clear to auscultation       Cardiovascular hypertension, + CAD, + Past MI, +CHF and + DOE  Normal cardiovascular exam Rhythm:Regular Rate:Normal     Neuro/Psych negative neurological ROS     GI/Hepatic GERD  ,  Endo/Other    Renal/GU Renal disease     Musculoskeletal  (+) Arthritis ,   Abdominal (+) + obese,   Peds  Hematology  (+) anemia ,   Anesthesia Other Findings   Reproductive/Obstetrics                            Anesthesia Physical Anesthesia Plan  ASA: III  Anesthesia Plan: MAC   Post-op Pain Management:    Induction:   PONV Risk Score and Plan: Treatment may vary due to age or medical condition  Airway Management Planned: Natural Airway and Nasal Cannula  Additional Equipment: None  Intra-op Plan:   Post-operative Plan:   Informed Consent: I have reviewed the patients History and Physical, chart, labs and discussed the procedure including the risks, benefits and alternatives for the proposed anesthesia with the patient or authorized representative who has indicated his/her understanding and acceptance.     Dental advisory given  Plan Discussed with: CRNA  Anesthesia Plan Comments: (Dx Melena S/P EGD 10/10/19 now for Colon)       Anesthesia Quick Evaluation

## 2019-10-10 NOTE — Consult Note (Signed)
Referring Provider:  [place] Primary Care Physician:  [name], MD Primary Gastroenterologist:  Dr. Paulita Fujita   Reason for Consultation:  Melena   HPI: [name] [name] is a 74 y.o. female with past medical history of atrial fibrillation on Xarelto, history of coronary artery disease, history of ischemic cardiomyopathy, COPD, GERD and dysphagia presented to the hospital with palpitation.  Was found to have A. fib with RVR.  She was also found to have drop in hemoglobin to 7.7.  She was complaining of melena.  GI is consulted for further evaluation.  Xarelto on hold since October 08, 2019.  Patient seen and examined at bedside.  She was taken off of Zantac because of recent recall.  She has started noticing worsening acid reflux for last few weeks.  She was taking Pepto-Bismol for few weeks.  She has also noted dark stool for last 1 week.  Complaining of chronic constipation.  Denies any bright red blood per rectum.  Complaining of trouble swallowing particularly solid foods.  Denies any abdominal pain.    Colonoscopy in 2013 at [facility] showed small tubular adenoma in the transverse colon.  EGD in 2013 and colonoscopy showed gastritis.  Biopsies were positive for H. pylori.  Past Medical History:  Diagnosis Date  . A-fib (HCC)    RVR- POST CABG- RESOLVED. S/P NSTEMI 3/16  . Acute respiratory failure ([place]) 08/2017  . Arthritis   . Asthma    ??  . Atopic dermatitis   . CAD (coronary artery disease)    a. s/p CABG 2016.  . Cardiac arrest ([place]) 10/2014   [name]  . Cardiomyopathy, ischemic    a. EF previously low, improved to LVEF 50-55% as of May 2018  . Chronic combined systolic and diastolic heart failure ([place])   . CKD (chronic kidney disease), stage III   . COPD (chronic obstructive pulmonary disease) ([place])   . Esophageal dilatation 2013  . GERD (gastroesophageal reflux disease)   . Gout   . Headache   . Hypertension   . Hypokalemia   . Idiopathic angioedema   . LGI bleed  08/06/2017   a. felt to be hemorrhoidal during that admission (no drop in Hgb).  . Lower back pain   . Persistent atrial fibrillation (HCC)    a. h/o difficult to control rates (complicated by noncompliance), not felt to be a candidate for ablation or antiarrhythmic due to noncompliance.  . Personal history of noncompliance with medical treatment, presenting hazards to health   . Prediabetes   . S/P CABG x 2 with clipping of LA appendage 11/14/2014   LIMA to LAD, SVG to OM, EVH via right thigh  . Urine incontinence   . Uterine fibroid     Past Surgical History:  Procedure Laterality Date  . CARDIOVERSION N/A 11/18/2014   Procedure: CARDIOVERSION;  Surgeon: [name], MD;  Location: [place];  Service: Cardiovascular;  Laterality: N/A;  . CARDIOVERSION N/A 12/25/2017   Procedure: CARDIOVERSION;  Surgeon: [name], MD;  Location: [place];  Service: Cardiovascular;  Laterality: N/A;  . CLIPPING OF ATRIAL APPENDAGE N/A 11/14/2014   Procedure: CLIPPING OF ATRIAL APPENDAGE;  Surgeon: [name], MD;  Location: [place];  Service: Open Heart Surgery;  Laterality: N/A;  . COLONOSCOPY  2013  . CORONARY ARTERY BYPASS GRAFT N/A 11/14/2014   Procedure: CORONARY ARTERY BYPASS GRAFTING (CABG)TIMES 2 USING LEFT INTERNAL MAMMARY ARTERY AND RIGHT SAPHENOUS VEIN HARVESTED ENDOSCOPICALLY;  Surgeon: [name], MD;  Location: [facility] OR;  Service: Open Heart Surgery;  Laterality: N/A;  . LEFT HEART CATHETERIZATION WITH CORONARY ANGIOGRAM N/A 11/04/2014   Procedure: LEFT HEART CATHETERIZATION WITH CORONARY ANGIOGRAM;  Surgeon: Troy Sine, MD;  Location[facility] C[facility] LAB;  Service: Cardiovascular;  Laterality: N/A;  . TEE WITHOUT CARDIOVERSION N/A 11/14/2014   Procedure: TRANSESOPHAGEAL ECHOCARDIOGRAM (TEE);  Surgeon: [name], MD;  Location: [place];  Service: Open Heart Surgery;  Laterality: N/A;  . TEE WITHOUT CARDIOVERSION N/A 12/25/2017   Procedure: TRANSESOPHAGEAL ECHOCARDIOGRAM (TEE);   Surgeon: [name], MD;  Location: [facility] ENDOSCOPY;  Service: Cardiovascular;  Laterality: N/A;  . TEMPORARY PACEMAKER INSERTION  11/04/2014   Procedure: TEMPORARY PACEMAKER INSERTION;  Surgeon: Troy Sine, MD;  Location: [facility] CATH LAB;  Service: Cardiovascular;;  . TOOTH EXTRACTION      Prior to Admission medications   Medication Sig Start Date End Date Taking? Authorizing Provider  acetaminophen (TYLENOL) 500 MG tablet Take 500 mg by mouth every 6 (six) hours as needed for moderate pain.    Yes [provider]  allopurinol (ZYLOPRIM) 300 MG tablet Take 1 tablet (300 mg total) by mouth daily. 01/04/18  Yes Georgiana Shore, NP  atorvastatin (LIPITOR) 10 MG tablet Take 10 mg by mouth daily.   Yes [provider]  carvedilol (COREG) 3.125 MG tablet Take 1 tablet (3.125 mg total) by mouth 2 (two) times daily with a meal. 08/03/18  Yes Bensimhon, Shaune Pascal, MD  furosemide (LASIX) 20 MG tablet TAKE 3 TABLETS (60 MG TOTAL) BY MOUTH 2 (TWO) TIMES DAILY. 11/22/17  Yes Hilty, Nadean Corwin, MD  losartan (COZAAR) 25 MG tablet Take 1 tablet (25 mg total) by mouth 2 (two) times daily. 07/17/19  Yes Bensimhon, Shaune Pascal, MD  pantoprazole (PROTONIX) 40 MG tablet Take 40 mg by mouth daily.   Yes [provider]  rivaroxaban (XARELTO) 20 MG TABS tablet Take 1 tablet (20 mg total) by mouth daily with supper. 03/02/18  Yes Bensimhon, Shaune Pascal, MD  spironolactone (ALDACTONE) 25 MG tablet Take 1 tablet (25 mg total) by mouth daily. 07/17/19 10/15/19 Yes Bensimhon, Shaune Pascal, MD  magnesium oxide (MAG-OX) 400 MG tablet Take 1 tablet (400 mg total) by mouth 3 (three) times daily. Patient not taking: Reported on 08/27/2019 12/08/17   Bensimhon, Shaune Pascal, MD    Scheduled Meds: . metoprolol tartrate  5 mg Intravenous Q6H  . [START ON 10/12/2019] pantoprazole  40 mg Intravenous Q12H   Continuous Infusions: . pantoprozole (PROTONIX) infusion 8 mg/hr (10/10/19 0345)   PRN Meds:.acetaminophen **OR**  acetaminophen  Allergies as of 10/08/2019 - Review Complete 10/08/2019  Allergen Reaction Noted  . Bee venom Anaphylaxis 10/29/2014  . Codeine Nausea And Vomiting 02/25/2015  . Entresto [sacubitril-valsartan]  08/27/2019  . Shrimp [shellfish allergy] Swelling 10/29/2014  . Tomato  01/30/2017    Family History  Problem Relation Age of Onset  . Cancer Mother        LYMPHOMA  . Heart disease Father   . Other Father        TB  . CVA Sister   . Prostate cancer Brother 53  . Diabetes Brother     Social History   Socioeconomic History  . Marital status: Widowed    Spouse name: Not on file  . Number of children: 1  . Years of education: 86  . Highest education level: Not on file  Occupational History  . Occupation: RETIRED [employer]  Tobacco Use  .  Smoking status: Former Smoker    Packs/day: 0.10    Years: 56.00    Pack years: 5.60    Types: Cigarettes    Quit date: 2019    Years since quitting: 2.1  . Smokeless tobacco: Never Used  Substance and Sexual Activity  . Alcohol use: No    Alcohol/week: 0.0 standard drinks  . Drug use: No  . Sexual activity: Not on file  Other Topics Concern  . Not on file  Social History Narrative   Patient reports it being difficult to pay for everything due to outstanding medical bills from hospital stay last year but states she is able to keep up with basic expenses.  Patient does have some concerns with her house's condition due to a water leak- had her roof repaired last month but has some leaking in the front which has affected her porch.  Patient owns her own car and is able to drive herself but has some concerns about the reliability of her car- has gotten taxi to the clinic for appointment in the past when her car broke down.   Social Determinants of Health   Financial Resource Strain:   . Difficulty of Paying Living Expenses: Not on file  Food Insecurity:   . Worried About Charity fundraiser in the Last Year: Not on  file  . Ran Out of Food in the Last Year: Not on file  Transportation Needs:   . Lack of Transportation (Medical): Not on file  . Lack of Transportation (Non-Medical): Not on file  Physical Activity:   . Days of Exercise per Week: Not on file  . Minutes of Exercise per Session: Not on file  Stress:   . Feeling of Stress : Not on file  Social Connections:   . Frequency of Communication with Friends and Family: Not on file  . Frequency of Social Gatherings with Friends and Family: Not on file  . Attends Religious Services: Not on file  . Active Member of Clubs or Organizations: Not on file  . Attends Archivist Meetings: Not on file  . Marital Status: Not on file  Intimate Partner Violence:   . Fear of Current or Ex-Partner: Not on file  . Emotionally Abused: Not on file  . Physically Abused: Not on file  . Sexually Abused: Not on file    Review of Systems: Review of Systems  Constitutional: Negative for chills and fever.  HENT: Negative for hearing loss and tinnitus.   Eyes: Negative for blurred vision and double vision.  Respiratory: Positive for shortness of breath. Negative for cough and hemoptysis.   Cardiovascular: Positive for chest pain and palpitations.  Gastrointestinal: Positive for constipation, heartburn and melena. Negative for abdominal pain, nausea and vomiting.  Genitourinary: Negative for dysuria and urgency.  Musculoskeletal: Negative for myalgias and neck pain.  Skin: Negative for itching and rash.  Neurological: Negative for seizures and loss of consciousness.  Endo/Heme/Allergies: Does not bruise/bleed easily.  Psychiatric/Behavioral: Negative for substance abuse. The patient is nervous/anxious.     Physical Exam: Vital signs: Vitals:   10/10/19 0559 10/10/19 0802  BP: 117/60 104/62  Pulse:  96  Resp:    Temp:  97.9 F (36.6 C)  SpO2:     Last BM Date: 10/08/19 Physical Exam  Constitutional: She is oriented to person, place, and time.  She appears well-developed and well-nourished. No distress.  HENT:  Head: Normocephalic and atraumatic.  Eyes: EOM are normal.  Cardiovascular:  Irregular  rhythm, no murmur  Pulmonary/Chest: Effort normal. No respiratory distress.  Anterior exam only  Abdominal: Soft. Bowel sounds are normal. She exhibits no distension. There is no abdominal tenderness. There is no rebound and no guarding.  Musculoskeletal:        General: No edema. Normal range of motion.     Cervical back: Normal range of motion and neck supple.  Neurological: She is alert and oriented to person, place, and time.  Skin: Skin is warm. No erythema.  Psychiatric: She has a normal mood and affect. Judgment and thought content normal.  Vitals reviewed.  GI:  Lab Results: Recent Labs    10/09/19 0009 10/09/19 0743  WBC 9.4 10.4  HGB 7.7* 9.3*  HCT 25.4* 29.8*  PLT 192 176   BMET Recent Labs    10/09/19 0009 10/09/19 0743  NA 137 139  K 4.1 4.5  CL 107 109  CO2 21* 18*  GLUCOSE 149* 109*  BUN 29* 25*  CREATININE 1.76* 1.46*  CALCIUM 8.7* 8.8*   LFT No results for input(s): PROT, ALBUMIN, AST, ALT, ALKPHOS, BILITOT, BILIDIR, IBILI in the last 72 hours. PT/INR Recent Labs    10/09/19 0043  LABPROT 18.5*  INR 1.6*     Studies/Results: DG Chest 2 View  Result Date: 10/09/2019 CLINICAL DATA:  Atrial fibrillation, palpitations EXAM: CHEST - 2 VIEW COMPARISON:  06/11/2019 FINDINGS: Frontal and lateral views of the chest demonstrates stable postsurgical changes from median sternotomy. The cardiac silhouette is unremarkable. Stable ectasia of the thoracic aorta. Lungs are hyperinflated. No airspace disease, effusion, or pneumothorax. No acute bony abnormalities. IMPRESSION: 1. Stable background emphysema.  No acute process. Electronically Signed   By: Randa Ngo M.D.   On: 10/09/2019 00:33    Impression/Plan: -Symptomatic anemia with melena and occult blood positive stool. -Esophageal  dysphagia -GERD -History of H. pylori infection - Afib. Xarelto on Hold  -Ischemic cardiomyopathy.  Recommendations ------------------------- -Proceed with EGD with possible dilation today. -Continue PPI  Risks (bleeding, infection, bowel perforation that could require surgery, sedation-related changes in cardiopulmonary systems), benefits (identification and possible treatment of source of symptoms, exclusion of certain causes of symptoms), and alternatives (watchful waiting, radiographic imaging studies, empiric medical treatment)  were explained to patient in detail and patient wishes to proceed.    LOS: 1 day   Otis Brace  MD, FACP 10/10/2019, 8:17 AM  Contact #  [phone]

## 2019-10-10 NOTE — Brief Op Note (Signed)
10/08/2019 - 10/10/2019  12:15 PM  PATIENT:  [name] [name]  74 y.o. female  PRE-OPERATIVE DIAGNOSIS:  Melena  POST-OPERATIVE DIAGNOSIS:  lower esophageal stricture - balloon dilation, no bleeding  PROCEDURE:  Procedure(s): ESOPHAGOGASTRODUODENOSCOPY (EGD) WITH PROPOFOL (N/A) BALLOON DILATION (N/A) BIOPSY  SURGEON:  Surgeon(s) and Role:    * [name], MD - Primary  Findings ------------ -EGD negative for acute bleeding.  Showed few small gastric and duodenal polyps.  Biopsies taken. -Lower esophageal stenosis.  Dilation with through-the-scope balloon up to 16.5 mm.  Recommendations --------------------------- -Start clear liquid diet -Recommend colonoscopy as no source of bleeding was found during EGD.  Patient with anemia and in need of anticoagulation. -Keep n.p.o. past midnight -Continue to hold anticoagulation.  Monitor H&H -Change Protonix to 40 mg p.o. once a day -GI will follow  Otis Brace MD, [place] 10/10/2019, 12:16 PM  Contact #  [phone]

## 2019-10-10 NOTE — Progress Notes (Signed)
PROGRESS NOTE    [name] [name]  [id number] DOB: 1946-07-11 DOA: 10/08/2019 PCP: [name], MD   Brief Narrative:  Patient is a 11 female with history of coronary disease status post CABG, ischemic cardiomyopathy, paroxysmal A. fib, COPD who presents to the emergency department with complaints of palpitations.  She was also having black tarry stools since last several days.  She was seen by her PCP recently and was found to be anemic.  She was taking Xarelto for anticoagulation for A. Fib.  On presentation, she was found to be in A. fib with RVR with hemoglobin of 7.  Patient was started on metoprolol IV for A. fib with RVR.  Started on Protonix drip.  FOBT was positive.  GI consulted . She underwent EGD today.  Plan for colonoscopy tomorrow.  Assessment & Plan:   Active Problems:   Chronic diastolic CHF (congestive heart failure) (HCC)   Atrial fibrillation with RVR (HCC)   S/P CABG x 2   Acute GI bleeding   Acute blood loss anemia   Upper GI bleed: Presented with melanotic stools.  FOBT positive ,her hemoglobin was in the range of 14 in 2019.  Was taking Xarelto. She was started on Protonix drip.  Presented with hemoglobin in the range of 7. S/P a unit of PRBC.  Hemoglobin this morning the range of 9 Underwent EGD with finding of benign-appearing esophageal stricture, normal gastric mucosa, no ulcers or bleeding source.  Started on clear liquid diet.  Plan for colonoscopy tomorrow. Protonix changed to 40 mg daily.  A. fib with RVR: Presented with RVR.  Started on IV metoprolol.  She follows with Dr. Tempie Hoist. Currently heart rate is well controlled.  Cardiology team following  Coronary artery disease: Status post CABG.  Denies any chest pain  Ischemic cardiomyopathy: Last echocardiogram done in 07/2018 showed ejection fraction of 30 to 35%.  Follows with heart failure team.  Currently euvolemic. Echocardiogram on this admission showed ejection fraction of 60 to  65%.  History of COPD: Respiratory status currently stable.  No wheezing  AKI on CKD stage II: Baseline creatinine around 1.4-1.5.  Currently kidney function is at baseline.          DVT prophylaxis:SCD Code Status: Full code Family Communication: Discussed with daughter on phone on 10/10/2019 Disposition Plan: Patient is from home.  Expected discharge to home when she completes GI interventions   Consultants: GI, cardiology  Procedures: None  Antimicrobials:  Anti-infectives (From admission, onward)   None      Subjective: Patient seen and examined at the bedside this morning.  Hemodynamically stable.  Comfortable.  Denies any abdominal pain or bloody bowel movements.  She was Waiting for EGD today  Objective: Vitals:   10/10/19 0348 10/10/19 0400 10/10/19 0500 10/10/19 0559  BP: 124/74 (!) 115/95  117/60  Pulse: 96     Resp: 20  17   Temp: 97.9 F (36.6 C)     TempSrc: Oral     SpO2: 98% 99% 99%   Weight:      Height:        Intake/Output Summary (Last 24 hours) at 10/10/2019 0742 Last data filed at 10/10/2019 0655 Gross per 24 hour  Intake 1390.6 ml  Output 400 ml  Net 990.6 ml   Filed Weights   10/08/19 2357 10/09/19 1515 10/10/19 0047  Weight: 102.1 kg 102.8 kg 103.6 kg    Examination:  General exam: Comfortable, morbidly obese Respiratory system: Bilateral equal air entry,  normal vesicular breath sounds, no wheezes or crackles  Cardiovascular system: S1 & S2 heard, RRR. No JVD, murmurs, rubs, gallops or clicks. Gastrointestinal system: Abdomen is nondistended, soft and nontender. No organomegaly or masses felt. Normal bowel sounds heard. Central nervous system: Alert and oriented. No focal neurological deficits. Extremities: No edema, no clubbing ,no cyanosis Skin: No rashes, lesions or ulcers,no icterus ,no pallor    Data Reviewed: I have personally reviewed following labs and imaging studies  CBC: Recent Labs  Lab 10/09/19 0009  10/09/19 0743  WBC 9.4 10.4  HGB 7.7* 9.3*  HCT 25.4* 29.8*  MCV 92.7 91.1  PLT 192 0000000   Basic Metabolic Panel: Recent Labs  Lab 10/09/19 0009 10/09/19 0743  NA 137 139  K 4.1 4.5  CL 107 109  CO2 21* 18*  GLUCOSE 149* 109*  BUN 29* 25*  CREATININE 1.76* 1.46*  CALCIUM 8.7* 8.8*  MG  --  2.1   GFR: Estimated Creatinine Clearance: 41.8 mL/min (A) (by C-G formula based on SCr of 1.46 mg/dL (H)). Liver Function Tests: No results for input(s): AST, ALT, ALKPHOS, BILITOT, PROT, ALBUMIN in the last 168 hours. No results for input(s): LIPASE, AMYLASE in the last 168 hours. No results for input(s): AMMONIA in the last 168 hours. Coagulation Profile: Recent Labs  Lab 10/09/19 0043  INR 1.6*   Cardiac Enzymes: No results for input(s): CKTOTAL, CKMB, CKMBINDEX, TROPONINI in the last 168 hours. BNP (last 3 results) No results for input(s): PROBNP in the last 8760 hours. HbA1C: No results for input(s): HGBA1C in the last 72 hours. CBG: Recent Labs  Lab 10/09/19 0821 10/09/19 1335 10/10/19 0029 10/10/19 0557  GLUCAP 125* 112* 121* 112*   Lipid Profile: No results for input(s): CHOL, HDL, LDLCALC, TRIG, CHOLHDL, LDLDIRECT in the last 72 hours. Thyroid Function Tests: No results for input(s): TSH, T4TOTAL, FREET4, T3FREE, THYROIDAB in the last 72 hours. Anemia Panel: No results for input(s): VITAMINB12, FOLATE, FERRITIN, TIBC, IRON, RETICCTPCT in the last 72 hours. Sepsis Labs: No results for input(s): PROCALCITON, LATICACIDVEN in the last 168 hours.  Recent Results (from the past 240 hour(s))  SARS CORONAVIRUS 2 (TAT 6-24 HRS) Nasopharyngeal Nasopharyngeal Swab     Status: None   Collection Time: 10/09/19  1:38 AM   Specimen: Nasopharyngeal Swab  Result Value Ref Range Status   SARS Coronavirus 2 NEGATIVE NEGATIVE Final    Comment: (NOTE) SARS-CoV-2 target nucleic acids are NOT DETECTED. The SARS-CoV-2 RNA is generally detectable in upper and lower respiratory  specimens during the acute phase of infection. Negative results do not preclude SARS-CoV-2 infection, do not rule out co-infections with other pathogens, and should not be used as the sole basis for treatment or other patient management decisions. Negative results must be combined with clinical observations, patient history, and epidemiological information. The expected result is Negative. Fact Sheet for Patients: SugarRoll.be Fact Sheet for Healthcare Providers: https://www.woods-mathews.com/ This test is not yet approved or cleared by the [place] FDA and  has been authorized for detection and/or diagnosis of SARS-CoV-2 by FDA under an Emergency Use Authorization (EUA). This EUA will remain  in effect (meaning this test can be used) for the duration of the COVID-19 declaration under Section 56 4(b)(1) of the Act, 21 U.S.C. section 360bbb-3(b)(1), unless the authorization is terminated or revoked sooner. Performed at [facility], [place] [address], [place], [place] [address]          Radiology Studies: DG Chest 2 View  Result Date: 10/09/2019 CLINICAL DATA:  Atrial fibrillation, palpitations EXAM: CHEST - 2 VIEW COMPARISON:  06/11/2019 FINDINGS: Frontal and lateral views of the chest demonstrates stable postsurgical changes from median sternotomy. The cardiac silhouette is unremarkable. Stable ectasia of the thoracic aorta. Lungs are hyperinflated. No airspace disease, effusion, or pneumothorax. No acute bony abnormalities. IMPRESSION: 1. Stable background emphysema.  No acute process. Electronically Signed   By: Randa Ngo M.D.   On: 10/09/2019 00:33        Scheduled Meds: . metoprolol tartrate  5 mg Intravenous Q6H  . [START ON 10/12/2019] pantoprazole  40 mg Intravenous Q12H   Continuous Infusions: . pantoprozole (PROTONIX) infusion 8 mg/hr (10/10/19 0345)     LOS: 1 day    Time spent:25 mins. More than 50% of  that time was spent in counseling and/or coordination of care.      Shelly Coss, MD [facility] P2/25/2021, 7:42 AM

## 2019-10-10 NOTE — Transfer of Care (Signed)
Immediate Anesthesia Transfer of Care Note  Patient: [name] [name]  Procedure(s) Performed: ESOPHAGOGASTRODUODENOSCOPY (EGD) WITH PROPOFOL (N/A ) BALLOON DILATION (N/A ) BIOPSY  Patient Location: PACU and Endoscopy Unit  Anesthesia Type:MAC  Level of Consciousness: awake, alert  and oriented  Airway & Oxygen Therapy: Patient Spontanous Breathing  Post-op Assessment: Report given to RN and Post -op Vital signs reviewed and stable  Post vital signs: Reviewed and stable  Last Vitals:  Vitals Value Taken Time  BP    Temp    Pulse 69 10/10/19 1211  Resp 19 10/10/19 1211  SpO2 99 % 10/10/19 1211  Vitals shown include unvalidated device data.  Last Pain:  Vitals:   10/10/19 1051  TempSrc: Oral  PainSc: 0-No pain         Complications: No apparent anesthesia complications

## 2019-10-10 NOTE — Progress Notes (Signed)
Patient remains NPO after midnight for EGD procedure today. Her consent has been obtained for the procedure. Lab in multiple times attempting draw labwork report patient is diffulcult stick. Patient A&O times 4 denies distress. Patient conts with Afib on tele. No further changes noted.

## 2019-10-11 ENCOUNTER — Inpatient Hospital Stay (HOSPITAL_COMMUNITY): Payer: HMO | Admitting: Anesthesiology

## 2019-10-11 ENCOUNTER — Encounter (HOSPITAL_COMMUNITY)
Admission: EM | Disposition: A | Discharge status: Home / Self Care | Payer: Self-pay | Source: Home / Self Care | Attending: Internal Medicine

## 2019-10-11 HISTORY — PX: POLYPECTOMY: SHX5525

## 2019-10-11 HISTORY — PX: BIOPSY: SHX5522

## 2019-10-11 HISTORY — PX: COLONOSCOPY WITH PROPOFOL: SHX5780

## 2019-10-11 LAB — CBC
HCT: 29.8 % — ABNORMAL LOW (ref 36.0–46.0)
Hemoglobin: 9.2 g/dL — ABNORMAL LOW (ref 12.0–15.0)
MCH: 28 pg (ref 26.0–34.0)
MCHC: 30.9 g/dL (ref 30.0–36.0)
MCV: 90.6 fL (ref 80.0–100.0)
Platelets: 188 10*3/uL (ref 150–400)
RBC: 3.29 MIL/uL — ABNORMAL LOW (ref 3.87–5.11)
RDW: 16.2 % — ABNORMAL HIGH (ref 11.5–15.5)
WBC: 8.8 10*3/uL (ref 4.0–10.5)
nRBC: 0 % (ref 0.0–0.2)

## 2019-10-11 LAB — GLUCOSE, CAPILLARY: Glucose-Capillary: 117 mg/dL — ABNORMAL HIGH (ref 70–99)

## 2019-10-11 LAB — SURGICAL PATHOLOGY

## 2019-10-11 SURGERY — COLONOSCOPY WITH PROPOFOL
Anesthesia: Monitor Anesthesia Care

## 2019-10-11 MED ORDER — PROPOFOL 500 MG/50ML IV EMUL
INTRAVENOUS | Status: DC | PRN
  Administered 2019-10-11: 150 ug/kg/min via INTRAVENOUS

## 2019-10-11 MED ORDER — METOPROLOL SUCCINATE ER 50 MG PO TB24: 50.0000 mg | ORAL_TABLET | Freq: Every day | ORAL | 1 refills | Status: DC

## 2019-10-11 MED ORDER — LACTATED RINGERS IV SOLN: INTRAVENOUS | Status: DC | PRN

## 2019-10-11 MED ORDER — FUROSEMIDE 20 MG PO TABS: 20.0000 mg | ORAL_TABLET | Freq: Every day | ORAL | 1 refills | Status: DC

## 2019-10-11 MED ORDER — PHENYLEPHRINE HCL (PRESSORS) 10 MG/ML IV SOLN
INTRAVENOUS | Status: DC | PRN
  Administered 2019-10-11 (×7): 80 ug via INTRAVENOUS

## 2019-10-11 MED ORDER — RIVAROXABAN 20 MG PO TABS: 20.0000 mg | ORAL_TABLET | Freq: Every day | ORAL | 11 refills | Status: DC

## 2019-10-11 SURGICAL SUPPLY — 22 items

## 2019-10-11 NOTE — Transfer of Care (Signed)
Immediate Anesthesia Transfer of Care Note  Patient: [name] [name]  Procedure(s) Performed: COLONOSCOPY WITH PROPOFOL (N/A ) POLYPECTOMY  Patient Location: Endoscopy Unit  Anesthesia Type:MAC  Level of Consciousness: drowsy and patient cooperative  Airway & Oxygen Therapy: Patient Spontanous Breathing and Patient connected to face mask oxygen  Post-op Assessment: Report given to RN, Post -op Vital signs reviewed and stable and Patient moving all extremities X 4  Post vital signs: Reviewed and stable  Last Vitals:  Vitals Value Taken Time  BP 92/33 10/11/19 0824  Temp    Pulse 64 10/11/19 0825  Resp 29 10/11/19 0825  SpO2 96 % 10/11/19 0825  Vitals shown include unvalidated device data.  Last Pain:  Vitals:   10/11/19 0709  TempSrc: Oral  PainSc: 0-No pain         Complications: No apparent anesthesia complications

## 2019-10-11 NOTE — Op Note (Signed)
[facility] Patient Name: [name] [name] Procedure Date : 10/11/2019 MRN: [id number] Attending MD: Otis Brace , MD Date of Birth: 30-Aug-1945 CSN: [id number] Age: 74 Admit Type: Inpatient Procedure:                Colonoscopy Indications:              Last colonoscopy: 2013, Heme positive stool, Rectal                            bleeding Providers:                Otis Brace, MD, Grace Isaac, RN, Theodora Blow, Technician Referring MD:              Medicines:                Sedation Administered by an Anesthesia Professional Complications:            No immediate complications. Estimated Blood Loss:     Estimated blood loss was minimal. Procedure:                Pre-Anesthesia Assessment:                           - Prior to the procedure, a History and Physical                            was performed, and patient medications and                            allergies were reviewed. [name] tolerance of                            previous anesthesia was also reviewed. The risks                            and benefits of the procedure and the sedation                            options and risks were discussed with the patient.                            All questions were answered, and informed consent                            was obtained. Prior Anticoagulants: The patient has                            taken Xarelto (rivaroxaban), last dose was 4 days                            prior to procedure. ASA Grade Assessment: III - A  patient with severe systemic disease. After                            reviewing the risks and benefits, the patient was                            deemed in satisfactory condition to undergo the                            procedure.                           After obtaining informed consent, the colonoscope                            was passed under direct vision. Throughout  the                            procedure, the patient's blood pressure, pulse, and                            oxygen saturations were monitored continuously. The                            [id number] Olympus pediatric colonscope                            was introduced through the anus and advanced to the                            the cecum, identified by appendiceal orifice and                            ileocecal valve. The colonoscopy was performed with                            moderate difficulty due to significant looping.                            Successful completion of the procedure was aided by                            changing the patient to a supine position and                            applying abdominal pressure. The patient tolerated                            the procedure well. The quality of the bowel                            preparation was good. Scope In: 7:37:37 AM Scope Out: 8:17:36 AM Scope Withdrawal Time: 0 hours 9 minutes 52 seconds  Total Procedure Duration: 0 hours 39 minutes 59  seconds  Findings:      Skin tags were found on perianal exam.      The colon (entire examined portion) revealed significantly excessive       looping. Advancing the scope required changing the patient to a supine       position and applying abdominal pressure.      A 8 mm polyp was found in the proximal ascending colon. The polyp was       sessile. The polyp was removed with a piecemeal technique using a cold       snare. Resection and retrieval were complete.      A 6 mm polyp was found in the transverse colon. The polyp was       semi-sessile. The polyp was removed with a cold snare. Resection and       retrieval were complete. ( Placed in ascending colon bottle as both       polyps were retrieved together in the trap)      A 7 mm polyp was found in the transverse colon. The polyp was sessile.       The polyp was removed with a cold snare. Resection and  retrieval were       complete.      A 8 mm polyp was found in the descending colon. The polyp was sessile.       The polyp was removed with a cold snare. Resection and retrieval were       complete.      Multiple diverticula were found in the sigmoid colon.      A few hyperplastic polyps were found in the rectum. The polyps were       diminutive in size. Biopsies were taken with a cold forceps for       histology.      Internal hemorrhoids were found during retroflexion. The hemorrhoids       were medium-sized. Impression:               - Perianal skin tags found on perianal exam.                           - There was significant looping of the colon.                           - One 8 mm polyp in the proximal ascending colon,                            removed piecemeal using a cold snare. Resected and                            retrieved.                           - One 6 mm polyp in the transverse colon, removed                            with a cold snare. Resected and retrieved.                           - One 7 mm polyp in the transverse colon, removed  with a cold snare. Resected and retrieved.                           - One 8 mm polyp in the descending colon, removed                            with a cold snare. Resected and retrieved.                           - Diverticulosis in the sigmoid colon.                           - A few diminutive polyps in the rectum. Biopsied.                           - Internal hemorrhoids. Recommendation:           - Return patient to hospital ward for ongoing care.                           - Soft diet.                           - Continue present medications.                           - Resume Xarelto (rivaroxaban) at prior dose                            tomorrow.                           - Repeat colonoscopy in 3 years for surveillance of                            multiple polyps.                           -  Return to my office in 2 months. Procedure Code(s):        --- Professional ---                           [phone], Colonoscopy, flexible; with removal of                            tumor(s), polyp(s), or other lesion(s) by snare                            technique                           [id number], 30, Colonoscopy, flexible; with biopsy,                            single or multiple Diagnosis Code(s):        --- Professional ---  K64.8, Other hemorrhoids                           K63.5, Polyp of colon                           K62.1, Rectal polyp                           K64.4, Residual hemorrhoidal skin tags                           R19.5, Other fecal abnormalities                           K62.5, Hemorrhage of anus and rectum                           K57.30, Diverticulosis of large intestine without                            perforation or abscess without bleeding CPT copyright 2019 [employer]. All rights reserved. The codes documented in this report are preliminary and upon coder review may  be revised to meet current compliance requirements. Otis Brace, MD Otis Brace, MD 10/11/2019 8:33:57 AM Number of Addenda: 0

## 2019-10-11 NOTE — Progress Notes (Addendum)
Progress Note  Patient Name: [name] [name] Date of Encounter: 10/11/2019  Primary Cardiologist: [name], MD   Subjective   No acute overnight events. Patient feeling well this morning and relieved that all of procedures have been completed. No chest pain, shortness of breath, or palpitations. Eager to go home.  Inpatient Medications    Scheduled Meds: . metoprolol tartrate  5 mg Intravenous Q6H  . pantoprazole  40 mg Oral Daily   Continuous Infusions:  PRN Meds: acetaminophen **OR** acetaminophen   Vital Signs    Vitals:   10/11/19 0824 10/11/19 0834 10/11/19 0844 10/11/19 0901  BP: (!) 92/33 (!) 145/53 131/70 137/62  Pulse: 64 66 64   Resp: 19 19 (!) 23 16  Temp: (!) 97.3 F (36.3 C)     TempSrc: Temporal     SpO2: 96% 98% 97% 99%  Weight:      Height:        Intake/Output Summary (Last 24 hours) at 10/11/2019 1017 Last data filed at 10/11/2019 0815 Gross per 24 hour  Intake 1464.79 ml  Output 1000 ml  Net 464.79 ml   Last 3 Weights 10/10/2019 10/09/2019 10/08/2019  Weight (lbs) 228 lb 6.4 oz 226 lb 10.1 oz 225 lb  Weight (kg) 103.602 kg 102.8 kg 102.059 kg      Telemetry    Normal sinus rhythm with PVCs. Rates in the 60-80's. - Personally Reviewed  ECG    No new ECG tracing today. - Personally Reviewed  Physical Exam   GEN: No acute distress.   Neck: Supple. Cardiac: RRR. No murmurs, rubs, or gallops.  Respiratory: Clear to auscultation bilaterally. GI: Soft, non-distended, and non-tender. MS: No lower extremity edema. No deformity. Skin: Warm and dry. Neuro:  No focal deficits. Psych: Normal affect. Responds appropriately.   Labs    High Sensitivity Troponin:   Recent Labs  Lab 10/09/19 0009 10/09/19 0138  TROPONINIHS 10 8      Chemistry Recent Labs  Lab 10/09/19 0009 10/09/19 0743 10/10/19 0927  NA 137 139 139  K 4.1 4.5 4.7  CL 107 109 110  CO2 21* 18* 19*  GLUCOSE 149* 109* 120*  BUN 29* 25* 19  CREATININE 1.76* 1.46*  1.45*  CALCIUM 8.7* 8.8* 8.8*  GFRNONAA 28* 35* 35*  GFRAA 32* 41* 41*  ANIONGAP 9 12 10      Hematology Recent Labs  Lab 10/09/19 0009 10/09/19 0743 10/10/19 0927  WBC 9.4 10.4 9.5  RBC 2.74* 3.27* 3.43*  HGB 7.7* 9.3* 9.8*  HCT 25.4* 29.8* 31.2*  MCV 92.7 91.1 91.0  MCH 28.1 28.4 28.6  MCHC 30.3 31.2 31.4  RDW 16.6* 16.1* 16.6*  PLT 192 176 194    BNPNo results for input(s): BNP, PROBNP in the last 168 hours.   DDimer No results for input(s): DDIMER in the last 168 hours.   Radiology    ECHOCARDIOGRAM COMPLETE  Result Date: 10/10/2019    ECHOCARDIOGRAM REPORT   Patient Name:   [name] [name] Date of Exam: 10/10/2019 Medical Rec #:  [id number]     Height:       67.0 in Accession #:    [id number]    Weight:       228.4 lb Date of Birth:  1945-08-19     BSA:          2.140 m Patient Age:    74 years      BP:  104/62 mmHg Patient Gender: F             HR:           96 bpm. Exam Location:  Inpatient Procedure: 2D Echo Indications:    427.31 atrial fibrillation  History:        Patient has prior history of Echocardiogram examinations, most                 recent 08/03/2018. Cardiomyopathy, CAD, Prior CABG, COPD,                 Arrythmias:Atrial Fibrillation; Risk Factors:Hypertension and                 Former Smoker. Hx of cardiac arrest.  Sonographer:    Jannett Celestine RDCS (AE) Referring Phys: [id number] Darreld Mclean  Sonographer Comments: Image acquisition challenging due to patient body habitus and Image acquisition challenging due to COPD. IMPRESSIONS  1. Left ventricular ejection fraction, by estimation, is 60 to 65%. The left ventricle has normal function. The left ventricle has no regional wall motion abnormalities. Left ventricular diastolic parameters are indeterminate. Elevated left ventricular end-diastolic pressure.  2. Right ventricular systolic function is normal. The right ventricular size is normal. Tricuspid regurgitation signal is inadequate for assessing PA  pressure.  3. The mitral valve is normal in structure and function. No evidence of mitral valve regurgitation. No evidence of mitral stenosis.  4. The aortic valve has an indeterminant number of cusps. Aortic valve regurgitation is not visualized. No aortic stenosis is present.  5. The inferior vena cava is normal in size with greater than 50% respiratory variability, suggesting right atrial pressure of 3 mmHg. FINDINGS  Left Ventricle: Left ventricular ejection fraction, by estimation, is 60 to 65%. The left ventricle has normal function. The left ventricle has no regional wall motion abnormalities. The left ventricular internal cavity size was normal in size. There is  no left ventricular hypertrophy. Left ventricular diastolic parameters are indeterminate. Elevated left ventricular end-diastolic pressure. Right Ventricle: The right ventricular size is normal. No increase in right ventricular wall thickness. Right ventricular systolic function is normal. Tricuspid regurgitation signal is inadequate for assessing PA pressure. Left Atrium: Left atrial size was normal in size. Right Atrium: Right atrial size was normal in size. Pericardium: There is no evidence of pericardial effusion. Mitral Valve: The mitral valve is normal in structure and function. Normal mobility of the mitral valve leaflets. Mild mitral annular calcification. No evidence of mitral valve regurgitation. No evidence of mitral valve stenosis. Tricuspid Valve: The tricuspid valve is normal in structure. Tricuspid valve regurgitation is not demonstrated. No evidence of tricuspid stenosis. Aortic Valve: The aortic valve has an indeterminant number of cusps. . There is moderate thickening and moderate calcification of the aortic valve. Aortic valve regurgitation is not visualized. No aortic stenosis is present. There is moderate thickening of the aortic valve. There is moderate calcification of the aortic valve. Pulmonic Valve: The pulmonic valve was  normal in structure. Pulmonic valve regurgitation is not visualized. No evidence of pulmonic stenosis. Aorta: The aortic root is normal in size and structure. Venous: The inferior vena cava is normal in size with greater than 50% respiratory variability, suggesting right atrial pressure of 3 mmHg. IAS/Shunts: The interatrial septum appears to be lipomatous. No atrial level shunt detected by color flow Doppler.  LEFT VENTRICLE PLAX 2D LVIDd:         4.60 cm  Diastology LVIDs:  2.90 cm  LV e' lateral:   10.00 cm/s LV PW:         1.10 cm  LV E/e' lateral: 9.1 LV IVS:        1.00 cm  LV e' medial:    5.44 cm/s LVOT diam:     2.00 cm  LV E/e' medial:  16.7 LV SV:         53 LV SV Index:   25 LVOT Area:     3.14 cm  LEFT ATRIUM             Index LA diam:        3.90 cm 1.82 cm/m LA Vol (A2C):   59.8 ml 27.95 ml/m LA Vol (A4C):   48.1 ml 22.48 ml/m LA Biplane Vol: 55.0 ml 25.71 ml/m  AORTIC VALVE LVOT Vmax:   79.00 cm/s LVOT Vmean:  50.500 cm/s LVOT VTI:    0.168 m  AORTA Ao Root diam: 3.20 cm MITRAL VALVE MV Area (PHT): 2.73 cm    SHUNTS MV Decel Time: 278 msec    Systemic VTI:  0.17 m MV E velocity: 90.80 cm/s  Systemic Diam: 2.00 cm MV A velocity: 82.70 cm/s MV E/A ratio:  1.10 Fransico Him MD Electronically signed by Fransico Him MD Signature Date/Time: 10/10/2019/10:36:31 AM    Final     Cardiac Studies   Echocardiogram 10/10/2019: Impressions: 1. Left ventricular ejection fraction, by estimation, is 60 to 65%. The  left ventricle has normal function. The left ventricle has no regional  wall motion abnormalities. Left ventricular diastolic parameters are  indeterminate. Elevated left ventricular  end-diastolic pressure.  2. Right ventricular systolic function is normal. The right ventricular  size is normal. Tricuspid regurgitation signal is inadequate for assessing  PA pressure.  3. The mitral valve is normal in structure and function. No evidence of  mitral valve regurgitation. No  evidence of mitral stenosis.  4. The aortic valve has an indeterminant number of cusps. Aortic valve  regurgitation is not visualized. No aortic stenosis is present.  5. The inferior vena cava is normal in size with greater than 50%  respiratory variability, suggesting right atrial pressure of 3 mmHg.   Patient Profile   Ms. Cree is a 74 y.o. female with a historyof CAD s/p CABG x2 in 2016, chronic combined CHF/ischemic cardiomyopathy with EF of 30-35% on Echo in 2019, paroxysmal atrial fibrillation on Xarelto, COPD, hypertension, hyperlipidemia, pre-diabetes, CKD stage III, GERD, mediation non-compliance, and prior tobacco use (quit in 2016)who is being seen today for the evaluation ofatrial fibrillation with RVRat the request of [name].  Assessment & Plan   Atrial Fibrillation with RVR - Patient presented with atrial fibrillation with RVR in setting of GI bleed. Rates initially as high as the 170's to 180's but are now well controlled on Cardizem drip. - Converted back to sinus rhythm on 2/24.  - Echo this admission showed improved EF of 60-65% with normal wall motion, indeterminate diastolic parameters, and elevated LVEDP. LVEF improved from 30-35% in 07/2018.  - Currently on IV Lopressor 5mg  every 6 hours. OK to transition to PO now that patient is able to take oral medications. Will discuss with MD about whether to resume home Coreg or switch to Metoprolol instead.  - She has previously not felt to be a good candidate for ablation or Tikosyn due to history of medication non-compliance. Patient has COPD with emphysema noted on chest x-ray, so Amiodarone is probably not a good option. -  CHA2DS2-VASc = 5 (CHF, HTN, age >63, CAD, female). Patient on Xarelto 20mg  daily at home but this is currently on hold due to acute anemia. Per GI, OK to restart Xarelto tomorrow.  CAD s/p CABG in 2016  - History of CABG x2 (LIMA to LAD and SVG to OM) in 2016. Low risk Myoview in 08/2018. - EKG  shows T wave inversion in leads II and aVL(which are not new)and mild ST depression in leads II and V4-V6. ST depression may be secondary to tachycardia. Will repeat EKG now that she is back in normal sinus rhythm.  - High-sensitivity troponin negative x2.  - No angina. - No additional ischemic work-up planned at this time with acute GI bleed.  Chronic Combined CHF/Ishcemic Cardiomyopathy - Echo this admission showed improved EF of 60-65% up from 30-35% in 07/2018.  -Patient appears euvolemic on exam. -Can likely resume home Lasix. - Home Coreg, Losartan, and Spirnolactone were all held on admission due to soft BP. BP has now improved. Will discuss with MD about how to resume these. - Monitor daily weight, strict I/O's, and renal function.  Acute Anemia/ Presumed Upper GI Bleed - Hemoglobin 7.7 on presentation.She reports melena for the past several days. - Hemoccult positive.  - S/p 1 unit of PRBC. Hemoglobin stable at 9.8 yesterday.  - GI consulted. EGD yesterday showed a few small gastric and duodenal polyps (biopsies taken) and lower esophageal stenosis. Dilation was performed. No acute bleeding noted. Colonoscopy earlier today showed a few colon polyps, diverticulosis, and internal hemorrhoids but no evidence of acute bleeding. - Per GI, OK to restart Xarelto tomorrow.  Hypertension - BP has been soft at times.  Most recent BP 137/62. - Can transition to PO beta-blocker as above. Will discuss resuming home meds with MD.  Hyperlipidemia - Continue home Lipitor 10mg  daily.  Acute on Chronic CKD Stage III - Creatinine 1.76 on admission and 1.45 yesterday. Baseline 1.3 to 1.5. - Suspect pre-renal due hypovolemia from GI bleed. - BMET today has not been drawn.  COPD - No active wheezing.  - Management per primary team.  For questions or updates, please contact [place] Please consult [email or web address] for contact info under        Signed, Darreld Mclean,  PA-C  10/11/2019, 10:17 AM    Personally seen and examined. Agree with above.   Comfortable, laying flat in bed.  Jovial.  No shortness of breath.  No further bleeding.  Colonoscopy did not show any active bleeding source.  Esophageal dilatation performed.  No bleeding source.  Per GI, she may resume her Xarelto tomorrow.  Thank you for guidance.  Instead of carvedilol, we will switch to metoprolol XL, 50 mg once a day when she is able to take orals confidently.  I think she may resume her home medication losartan and spironolactone as well.  Blood pressure is improved.  We will go ahead and sign off.  Please let us know if we can be of further assistance.  We will see her back in clinic in 2 to 4 weeks.  [name], MD

## 2019-10-11 NOTE — Discharge Summary (Signed)
Physician Discharge Summary  [name] [name] [id number] DOB: 01-11-1946 DOA: 10/08/2019  PCP: [name], MD  Admit date: 10/08/2019 Discharge date: 10/11/2019  Admitted From: Home Disposition:  Home  Discharge Condition:Stable CODE STATUS:FULL Diet recommendation: Heart Healthy  Brief/Interim Summary:  Patient is a 74 female with history of coronary disease status post CABG, ischemic cardiomyopathy, paroxysmal A. fib, COPD who presents to the emergency department with complaints of palpitations.  She was also having black tarry stools since last several days.  She was seen by her PCP recently and was found to be anemic.  She was taking Xarelto for anticoagulation for A. Fib.  On presentation, she was found to be in A. fib with RVR with hemoglobin of 7.  Patient was started on metoprolol IV for A. fib with RVR.  Started on Protonix drip.  FOBT was positive.  GI consulted . She underwent EGD with finding of no active source of bleeding.  She also underwent colonoscopy with finding of few polyps, diverticulosis and internal hemorrhoids but no active source of bleeding.  Her hemoglobin has remained stable.  Her heart rate is well controlled this morning.  She is hemodynamically stable for discharge to home today.  She will follow-up with GI and cardiology as an outpatient.  Following problems were addressed during her  Hospitalization:  Upper GI bleed: Presented with melanotic stools.  FOBT positive ,her hemoglobin was in the range of 14 in 2019.  Was taking Xarelto. She was started on Protonix drip.  Presented with hemoglobin in the range of 7. S/P a unit of PRBC.  Hemoglobin this morning the range of 9 Underwent EGD with finding of benign-appearing esophageal stricture, normal gastric mucosa, no ulcers or bleeding source.   She also underwent colonoscopy with finding of few polyps, diverticulosis and internal hemorrhoids but no active source of bleeding.   Follow-up with gastroenterology in  6 weeks.  A. fib with RVR: Presented with RVR.  Started on IV metoprolol.  She follows with Dr. Tempie Hoist. Currently heart rate is well controlled.  Cardiology team following.  Carvedilol changed to Toprol on discharge  Coronary artery disease: Status post CABG.  Denies any chest pain  Ischemic cardiomyopathy: Last echocardiogram done in 07/2018 showed ejection fraction of 30 to 35%.  Follows with heart failure team.  Currently euvolemic. Echocardiogram on this admission showed ejection fraction of 60 to 65%.  Lasix dose decreased to 20 mg daily.  Continue losartan and spironolactone  History of COPD: Respiratory status currently stable.  No wheezing  AKI on CKD stage II: Baseline creatinine around 1.4-1.5.  Currently kidney function is at baseline.   Discharge Diagnoses:  Active Problems:   Chronic diastolic CHF (congestive heart failure) (HCC)   Atrial fibrillation with RVR (HCC)   S/P CABG x 2   Acute GI bleeding   Acute blood loss anemia    Discharge Instructions  Discharge Instructions    Diet - low sodium heart healthy   Complete by: As directed    Discharge instructions   Complete by: As directed    1)Please follow up with your PCP in a week.  Do a CBC test to check your hemoglobin during the follow-up 2)Follow up with gastroenterology in 6 weeks.  Name and number the provider has been attached. 3)You have an appointment with cardiology on 10/24/2019 at 10:45 AM   Increase activity slowly   Complete by: As directed      Allergies as of 10/11/2019  Reactions   Bee Venom Anaphylaxis   Codeine Nausea And Vomiting   Entresto [sacubitril-valsartan]    Chest pain    Shrimp [shellfish Allergy] Swelling      Medication List    STOP taking these medications   carvedilol 3.125 MG tablet Commonly known as: COREG   magnesium oxide 400 MG tablet Commonly known as: MAG-OX     TAKE these medications   acetaminophen 500 MG tablet Commonly known as:  TYLENOL Take 500 mg by mouth every 6 (six) hours as needed for moderate pain.   allopurinol 300 MG tablet Commonly known as: ZYLOPRIM Take 1 tablet (300 mg total) by mouth daily.   atorvastatin 10 MG tablet Commonly known as: LIPITOR Take 10 mg by mouth daily.   furosemide 20 MG tablet Commonly known as: Lasix Take 1 tablet (20 mg total) by mouth daily. What changed:   how much to take  when to take this   losartan 25 MG tablet Commonly known as: COZAAR Take 1 tablet (25 mg total) by mouth 2 (two) times daily.   metoprolol succinate 50 MG 24 hr tablet Commonly known as: Toprol XL Take 1 tablet (50 mg total) by mouth daily. Take with or immediately following a meal.   pantoprazole 40 MG tablet Commonly known as: PROTONIX Take 40 mg by mouth daily.   rivaroxaban 20 MG Tabs tablet Commonly known as: XARELTO Take 1 tablet (20 mg total) by mouth daily with supper. Resume tomorrow What changed: additional instructions   spironolactone 25 MG tablet Commonly known as: ALDACTONE Take 1 tablet (25 mg total) by mouth daily.      Follow-up Information    Brahmbhatt, Parag, MD. Schedule an appointment as soon as possible for a visit in 6 week(s).   Specialty: Gastroenterology Why: anemia, follow up for GI bleed Contact information: [place] [address] [phone]        Tommie Raymond, NP Follow up.   Specialty: Cardiology Why: You have a follow-up visit scheduled for 10/24/2019 at 10:45am with Kathyrn Drown, one of Dr. Kingsley Plan NPs. Please arrive 15 minutes early for check-in. If this date/time does not work for you, please call our office to reschedule. Contact information: [address] [place] [address] [phone]        [name], MD. Schedule an appointment as soon as possible for a visit in 1 week(s).   Specialty: Internal Medicine Contact information: [address], [address] [name]  [address] [phone]          Allergies  Allergen Reactions  . Bee Venom Anaphylaxis  . Codeine Nausea And Vomiting  . Entresto [Sacubitril-Valsartan]     Chest pain   . Shrimp [Shellfish Allergy] Swelling    Consultations:  Cadiology,GI   Procedures/Studies: DG Chest 2 View  Result Date: 10/09/2019 CLINICAL DATA:  Atrial fibrillation, palpitations EXAM: CHEST - 2 VIEW COMPARISON:  06/11/2019 FINDINGS: Frontal and lateral views of the chest demonstrates stable postsurgical changes from median sternotomy. The cardiac silhouette is unremarkable. Stable ectasia of the thoracic aorta. Lungs are hyperinflated. No airspace disease, effusion, or pneumothorax. No acute bony abnormalities. IMPRESSION: 1. Stable background emphysema.  No acute process. Electronically Signed   By: Randa Ngo M.D.   On: 10/09/2019 00:33   ECHOCARDIOGRAM COMPLETE  Result Date: 10/10/2019    ECHOCARDIOGRAM REPORT   Patient Name:   [name] [name] Date of Exam: 10/10/2019 Medical Rec #:  [id number]     Height:       67.0 in Accession #:    [id number]    Weight:       228.4 lb Date of Birth:  October 17, 1945     BSA:          2.140 m Patient Age:    89 years      BP:           104/62 mmHg Patient Gender: F             HR:           96 bpm. Exam Location:  Inpatient Procedure: 2D Echo Indications:    427.31 atrial fibrillation  History:        Patient has prior history of Echocardiogram examinations, most                 recent 08/03/2018. Cardiomyopathy, CAD, Prior CABG, COPD,                 Arrythmias:Atrial Fibrillation; Risk Factors:Hypertension and                 Former Smoker. Hx of cardiac arrest.  Sonographer:    Jannett Celestine RDCS (AE) Referring Phys: [id number] Darreld Mclean  Sonographer Comments: Image acquisition challenging due to patient body habitus and Image acquisition challenging due to COPD. IMPRESSIONS  1. Left ventricular ejection fraction, by estimation, is 60 to 65%. The left ventricle has normal  function. The left ventricle has no regional wall motion abnormalities. Left ventricular diastolic parameters are indeterminate. Elevated left ventricular end-diastolic pressure.  2. Right ventricular systolic function is normal. The right ventricular size is normal. Tricuspid regurgitation signal is inadequate for assessing PA pressure.  3. The mitral valve is normal in structure and function. No evidence of mitral valve regurgitation. No evidence of mitral stenosis.  4. The aortic valve has an indeterminant number of cusps. Aortic valve regurgitation is not visualized. No aortic stenosis is present.  5. The inferior vena cava is normal in size with greater than 50% respiratory variability, suggesting right atrial pressure of 3 mmHg. FINDINGS  Left Ventricle: Left ventricular ejection fraction, by estimation, is 60 to 65%. The left ventricle has normal function. The left ventricle has no regional wall motion abnormalities. The left ventricular internal cavity size was normal in size. There is  no left ventricular hypertrophy. Left ventricular diastolic parameters are indeterminate. Elevated left ventricular end-diastolic pressure. Right Ventricle: The right ventricular size is normal. No increase in right ventricular wall thickness. Right ventricular systolic function is normal. Tricuspid regurgitation signal is inadequate for assessing PA pressure. Left Atrium: Left atrial size was normal in size. Right Atrium: Right atrial size was normal in size. Pericardium: There is no evidence of pericardial effusion. Mitral Valve: The mitral valve is normal in structure and function. Normal mobility of the mitral valve leaflets. Mild mitral annular calcification. No evidence of mitral valve regurgitation. No evidence of mitral valve stenosis. Tricuspid Valve: The tricuspid valve is normal in structure. Tricuspid valve regurgitation is not demonstrated. No evidence of tricuspid stenosis. Aortic Valve: The aortic valve has an  indeterminant number of cusps. . There is moderate thickening and moderate calcification of the aortic valve. Aortic valve regurgitation is not visualized. No aortic stenosis is present. There is moderate thickening of the aortic valve. There is moderate calcification of the aortic valve. Pulmonic Valve: The pulmonic valve was normal in structure. Pulmonic valve regurgitation is not visualized. No  evidence of pulmonic stenosis. Aorta: The aortic root is normal in size and structure. Venous: The inferior vena cava is normal in size with greater than 50% respiratory variability, suggesting right atrial pressure of 3 mmHg. IAS/Shunts: The interatrial septum appears to be lipomatous. No atrial level shunt detected by color flow Doppler.  LEFT VENTRICLE PLAX 2D LVIDd:         4.60 cm  Diastology LVIDs:         2.90 cm  LV e' lateral:   10.00 cm/s LV PW:         1.10 cm  LV E/e' lateral: 9.1 LV IVS:        1.00 cm  LV e' medial:    5.44 cm/s LVOT diam:     2.00 cm  LV E/e' medial:  16.7 LV SV:         53 LV SV Index:   25 LVOT Area:     3.14 cm  LEFT ATRIUM             Index LA diam:        3.90 cm 1.82 cm/m LA Vol (A2C):   59.8 ml 27.95 ml/m LA Vol (A4C):   48.1 ml 22.48 ml/m LA Biplane Vol: 55.0 ml 25.71 ml/m  AORTIC VALVE LVOT Vmax:   79.00 cm/s LVOT Vmean:  50.500 cm/s LVOT VTI:    0.168 m  AORTA Ao Root diam: 3.20 cm MITRAL VALVE MV Area (PHT): 2.73 cm    SHUNTS MV Decel Time: 278 msec    Systemic VTI:  0.17 m MV E velocity: 90.80 cm/s  Systemic Diam: 2.00 cm MV A velocity: 82.70 cm/s MV E/A ratio:  1.10 Fransico Him MD Electronically signed by Fransico Him MD Signature Date/Time: 10/10/2019/10:36:31 AM    Final        Subjective: Patient seen and examined at the bedside this morning.  Hemodynamically stable for discharge.  Discharge Exam: Vitals:   10/11/19 0844 10/11/19 0901  BP: 131/70 137/62  Pulse: 64   Resp: (!) 23 16  Temp:    SpO2: 97% 99%   Vitals:   10/11/19 0824 10/11/19 0834  10/11/19 0844 10/11/19 0901  BP: (!) 92/33 (!) 145/53 131/70 137/62  Pulse: 64 66 64   Resp: 19 19 (!) 23 16  Temp: (!) 97.3 F (36.3 C)     TempSrc: Temporal     SpO2: 96% 98% 97% 99%  Weight:      Height:        General: Pt is alert, awake, not in acute distress Cardiovascular: RRR, S1/S2 +, no rubs, no gallops Respiratory: CTA bilaterally, no wheezing, no rhonchi Abdominal: Soft, NT, ND, bowel sounds + Extremities: no edema, no cyanosis    The results of significant diagnostics from this hospitalization (including imaging, microbiology, ancillary and laboratory) are listed below for reference.     Microbiology: Recent Results (from the past 240 hour(s))  SARS CORONAVIRUS 2 (TAT 6-24 HRS) Nasopharyngeal Nasopharyngeal Swab     Status: None   Collection Time: 10/09/19  1:38 AM   Specimen: Nasopharyngeal Swab  Result Value Ref Range Status   SARS Coronavirus 2 NEGATIVE NEGATIVE Final    Comment: (NOTE) SARS-CoV-2 target nucleic acids are NOT DETECTED. The SARS-CoV-2 RNA is generally detectable in upper and lower respiratory specimens during the acute phase of infection. Negative results do not preclude SARS-CoV-2 infection, do not rule out co-infections with other pathogens, and should not be used as the sole  basis for treatment or other patient management decisions. Negative results must be combined with clinical observations, patient history, and epidemiological information. The expected result is Negative. Fact Sheet for Patients: SugarRoll.be Fact Sheet for Healthcare Providers: https://www.woods-mathews.com/ This test is not yet approved or cleared by the [place] FDA and  has been authorized for detection and/or diagnosis of SARS-CoV-2 by FDA under an Emergency Use Authorization (EUA). This EUA will remain  in effect (meaning this test can be used) for the duration of the COVID-19 declaration under Section 56 4(b)(1)  of the Act, 21 U.S.C. section 360bbb-3(b)(1), unless the authorization is terminated or revoked sooner. Performed at [facility], [place] [address], [place], [place] [address]      Labs: BNP (last 3 results) No results for input(s): BNP in the last 8760 hours. Basic Metabolic Panel: Recent Labs  Lab 10/09/19 0009 10/09/19 0743 10/10/19 0927  NA 137 139 139  K 4.1 4.5 4.7  CL 107 109 110  CO2 21* 18* 19*  GLUCOSE 149* 109* 120*  BUN 29* 25* 19  CREATININE 1.76* 1.46* 1.45*  CALCIUM 8.7* 8.8* 8.8*  MG  --  2.1  --    Liver Function Tests: No results for input(s): AST, ALT, ALKPHOS, BILITOT, PROT, ALBUMIN in the last 168 hours. No results for input(s): LIPASE, AMYLASE in the last 168 hours. No results for input(s): AMMONIA in the last 168 hours. CBC: Recent Labs  Lab 10/09/19 0009 10/09/19 0743 10/10/19 0927  WBC 9.4 10.4 9.5  NEUTROABS  --   --  7.0  HGB 7.7* 9.3* 9.8*  HCT 25.4* 29.8* 31.2*  MCV 92.7 91.1 91.0  PLT 192 176 194   Cardiac Enzymes: No results for input(s): CKTOTAL, CKMB, CKMBINDEX, TROPONINI in the last 168 hours. BNP: Invalid input(s): POCBNP CBG: Recent Labs  Lab 10/09/19 1335 10/10/19 0029 10/10/19 0557 10/10/19 2341 10/11/19 0610  GLUCAP 112* 121* 112* 95 117*   D-Dimer No results for input(s): DDIMER in the last 72 hours. Hgb A1c No results for input(s): HGBA1C in the last 72 hours. Lipid Profile No results for input(s): CHOL, HDL, LDLCALC, TRIG, CHOLHDL, LDLDIRECT in the last 72 hours. Thyroid function studies No results for input(s): TSH, T4TOTAL, T3FREE, THYROIDAB in the last 72 hours.  Invalid input(s): FREET3 Anemia work up No results for input(s): VITAMINB12, FOLATE, FERRITIN, TIBC, IRON, RETICCTPCT in the last 72 hours. Urinalysis    Component Value Date/Time   COLORURINE AMBER (A) 10/16/2017 0353   APPEARANCEUR CLOUDY (A) 10/16/2017 0353   LABSPEC 1.017 10/16/2017 0353   PHURINE 7.0 10/16/2017 0353   GLUCOSEU  NEGATIVE 10/16/2017 0353   HGBUR NEGATIVE 10/16/2017 0353   BILIRUBINUR NEGATIVE 10/16/2017 0353   BILIRUBINUR ng 12/25/2013 1342   KETONESUR NEGATIVE 10/16/2017 0353   PROTEINUR 30 (A) 10/16/2017 0353   UROBILINOGEN 0.2 04/11/2017 1402   NITRITE NEGATIVE 10/16/2017 0353   LEUKOCYTESUR SMALL (A) 10/16/2017 0353   Sepsis Labs Invalid input(s): PROCALCITONIN,  WBC,  LACTICIDVEN Microbiology Recent Results (from the past 240 hour(s))  SARS CORONAVIRUS 2 (TAT 6-24 HRS) Nasopharyngeal Nasopharyngeal Swab     Status: None   Collection Time: 10/09/19  1:38 AM   Specimen: Nasopharyngeal Swab  Result Value Ref Range Status   SARS Coronavirus 2 NEGATIVE NEGATIVE Final    Comment: (NOTE) SARS-CoV-2 target nucleic acids are NOT DETECTED. The SARS-CoV-2 RNA is generally detectable in upper and lower respiratory specimens during the acute phase of infection. Negative results do not preclude  SARS-CoV-2 infection, do not rule out co-infections with other pathogens, and should not be used as the sole basis for treatment or other patient management decisions. Negative results must be combined with clinical observations, patient history, and epidemiological information. The expected result is Negative. Fact Sheet for Patients: SugarRoll.be Fact Sheet for Healthcare Providers: https://www.woods-mathews.com/ This test is not yet approved or cleared by the [place] FDA and  has been authorized for detection and/or diagnosis of SARS-CoV-2 by FDA under an Emergency Use Authorization (EUA). This EUA will remain  in effect (meaning this test can be used) for the duration of the COVID-19 declaration under Section 56 4(b)(1) of the Act, 21 U.S.C. section 360bbb-3(b)(1), unless the authorization is terminated or revoked sooner. Performed at [facility], [place] [address], [place], [place] [address]     Please note: You were cared for by a hospitalist  during your hospital stay. Once you are discharged, your primary care physician will handle any further medical issues. Please note that NO REFILLS for any discharge medications will be authorized once you are discharged, as it is imperative that you return to your primary care physician (or establish a relationship with a primary care physician if you do not have one) for your post hospital discharge needs so that they can reassess your need for medications and monitor your lab values.    Time coordinating discharge: 40 minutes  SIGNED:   Shelly Coss, MD  [facility] 10/11/2019, 11:45 AM Pager [phone]  If 7PM-7AM, please contact night-coverage [email or web address] Password [email or web address]

## 2019-10-11 NOTE — Anesthesia Postprocedure Evaluation (Signed)
Anesthesia Post Note  Patient: [name] [name]  Procedure(s) Performed: COLONOSCOPY WITH PROPOFOL (N/A ) POLYPECTOMY     Patient location during evaluation: Endoscopy Anesthesia Type: MAC Level of consciousness: awake and alert Pain management: pain level controlled Vital Signs Assessment: post-procedure vital signs reviewed and stable Respiratory status: spontaneous breathing, nonlabored ventilation, respiratory function stable and patient connected to nasal cannula oxygen Cardiovascular status: blood pressure returned to baseline and stable Postop Assessment: no apparent nausea or vomiting Anesthetic complications: no    Last Vitals:  Vitals:   10/11/19 0844 10/11/19 0901  BP: 131/70 137/62  Pulse: 64   Resp: (!) 23 16  Temp:    SpO2: 97% 99%    Last Pain:  Vitals:   10/11/19 0844  TempSrc:   PainSc: 0-No pain                 Barnet Glasgow

## 2019-10-11 NOTE — Progress Notes (Signed)
[facility] Gastroenterology Progress Note  [name] [name] 74 y.o. 03/12/1946  CC: GI bleed   Subjective: No acute issues overnight.  Did not completed the prep , but did drink more than half gallon of it.  Denies any bleeding during the prep.  Denies any GI symptoms    Objective: Vital signs in last 24 hours: Vitals:   10/11/19 0628 10/11/19 0709  BP: (!) 151/71 (!) 193/72  Pulse: 64 69  Resp: 20 20  Temp: (!) 97.4 F (36.3 C) 98.8 F (37.1 C)  SpO2: 98% 100%    Physical Exam:  General.  Well developed.  Not in acute distress Abdomen.  Soft, nontender, nondistended, bowel sounds present. Neuro alert/oriented x3 Psych.  Mood and affect normal   Lab Results: Recent Labs    10/09/19 0743 10/10/19 0927  NA 139 139  K 4.5 4.7  CL 109 110  CO2 18* 19*  GLUCOSE 109* 120*  BUN 25* 19  CREATININE 1.46* 1.45*  CALCIUM 8.8* 8.8*  MG 2.1  --    No results for input(s): AST, ALT, ALKPHOS, BILITOT, PROT, ALBUMIN in the last 72 hours. Recent Labs    10/09/19 0743 10/10/19 0927  WBC 10.4 9.5  NEUTROABS  --  7.0  HGB 9.3* 9.8*  HCT 29.8* 31.2*  MCV 91.1 91.0  PLT 176 194   Recent Labs    10/09/19 0043  LABPROT 18.5*  INR 1.6*      Assessment/Plan: - Symptomatic anemia with melena and occult blood positive stool.  EGD negative for bleeding yesterday. -Esophageal dysphagia -GERD -History of H. pylori infection - Afib. Xarelto on Hold  -Ischemic cardiomyopathy.  Recommendations ------------------------- -Proceed with colonoscopy today  Risks (bleeding, infection, bowel perforation that could require surgery, sedation-related changes in cardiopulmonary systems), benefits (identification and possible treatment of source of symptoms, exclusion of certain causes of symptoms), and alternatives (watchful waiting, radiographic imaging studies, empiric medical treatment)  were explained to patient in detail and patient wishes to proceed.   Otis Brace MD,  [place] 10/11/2019, 7:28 AM  Contact #  [phone]

## 2019-10-11 NOTE — Brief Op Note (Signed)
10/08/2019 - 10/11/2019  8:34 AM  PATIENT:  [name] [name]  74 y.o. female  PRE-OPERATIVE DIAGNOSIS:  GI bleed  POST-OPERATIVE DIAGNOSIS:  no active bleeding. polyps removed descend, transverse  PROCEDURE:  Procedure(s): COLONOSCOPY WITH PROPOFOL (N/A) POLYPECTOMY  SURGEON:  Surgeon(s) and Role:    * Emmi Wertheim, MD - Primary  Findings ---------- -Colonoscopy was technically difficult because of significant looping.  No evidence of active bleeding. -Few colon polyps, diverticulosis and internal hemorrhoids noted.  Recommendations ------------------------- -Advance diet -No further inpatient GI work-up planned -Okay to resume Xarelto from tomorrow -Recommend repeat colonoscopy in 3 years -Okay to discharge from GI standpoint.  GI will sign off.  Follow-up in GI clinic in 4 to 8 weeks.  Otis Brace MD, [place] 10/11/2019, 8:35 AM  Contact #  [phone]

## 2019-10-14 ENCOUNTER — Other Ambulatory Visit: Payer: Self-pay

## 2019-10-14 NOTE — Patient Outreach (Signed)
  [facility]) Care Management Chronic Special Needs Program    10/14/2019  Name: [name] [name], [name] [name]: 1945-11-16  MRN: [id number]   [name] [name] is enrolled in a chronic special needs plan for Heart Failure. Client admitted to the hospital on 10/08/2019 with acute GI bleed, atrial fibrillation with RVR. EGD and colonoscopy done 2/25 and 2/26 respectively. discharged on 10/11/2019. Transition of care per EMMI solutions.RNCM will address any EMMI red flags as indicated.  Goals Addressed            This Visit's Progress   . General - Client will not be readmitted within 30 days (C-SNP) discharge date 10/11/2019   On track    Please follow discharge instructions and call provider if you have any questions. Please attend all follow up appointments as scheduled. Please take your medications as prescribed. Please call 24 hour nurse advice line as needed. [phone]).        Plan: RNCM will update care plan and send updated care plan to client and primary care. RNCM will follow up with client 1-2 months post discharge.  Thea Silversmith, RN, MSN, [place] [phone]

## 2019-10-15 LAB — SURGICAL PATHOLOGY

## 2019-10-24 ENCOUNTER — Ambulatory Visit: Payer: HMO | Admitting: Cardiology

## 2019-10-26 ENCOUNTER — Other Ambulatory Visit: Payer: Self-pay

## 2019-10-26 ENCOUNTER — Inpatient Hospital Stay (HOSPITAL_COMMUNITY)
Admission: EM | Admit: 2019-10-26 | Discharge: 2019-10-29 | DRG: 378 | Disposition: A | Discharge status: Home / Self Care | Payer: HMO | Attending: Internal Medicine | Admitting: Internal Medicine

## 2019-10-26 ENCOUNTER — Encounter (HOSPITAL_COMMUNITY): Payer: Self-pay | Admitting: Emergency Medicine

## 2019-10-26 DIAGNOSIS — K921 Melena: Secondary | ICD-10-CM | POA: Diagnosis not present

## 2019-10-26 DIAGNOSIS — I480 Paroxysmal atrial fibrillation: Secondary | ICD-10-CM | POA: Diagnosis present

## 2019-10-26 DIAGNOSIS — K449 Diaphragmatic hernia without obstruction or gangrene: Secondary | ICD-10-CM | POA: Diagnosis present

## 2019-10-26 DIAGNOSIS — E669 Obesity, unspecified: Secondary | ICD-10-CM | POA: Diagnosis present

## 2019-10-26 DIAGNOSIS — I4819 Other persistent atrial fibrillation: Secondary | ICD-10-CM | POA: Diagnosis present

## 2019-10-26 DIAGNOSIS — M109 Gout, unspecified: Secondary | ICD-10-CM | POA: Diagnosis not present

## 2019-10-26 DIAGNOSIS — I10 Essential (primary) hypertension: Secondary | ICD-10-CM | POA: Diagnosis present

## 2019-10-26 DIAGNOSIS — K222 Esophageal obstruction: Secondary | ICD-10-CM | POA: Diagnosis not present

## 2019-10-26 DIAGNOSIS — I130 Hypertensive heart and chronic kidney disease with heart failure and stage 1 through stage 4 chronic kidney disease, or unspecified chronic kidney disease: Secondary | ICD-10-CM | POA: Diagnosis not present

## 2019-10-26 DIAGNOSIS — Z951 Presence of aortocoronary bypass graft: Secondary | ICD-10-CM

## 2019-10-26 DIAGNOSIS — I5043 Acute on chronic combined systolic (congestive) and diastolic (congestive) heart failure: Secondary | ICD-10-CM

## 2019-10-26 DIAGNOSIS — I5023 Acute on chronic systolic (congestive) heart failure: Secondary | ICD-10-CM | POA: Diagnosis present

## 2019-10-26 DIAGNOSIS — K219 Gastro-esophageal reflux disease without esophagitis: Secondary | ICD-10-CM | POA: Diagnosis not present

## 2019-10-26 DIAGNOSIS — K922 Gastrointestinal hemorrhage, unspecified: Secondary | ICD-10-CM | POA: Diagnosis present

## 2019-10-26 DIAGNOSIS — I252 Old myocardial infarction: Secondary | ICD-10-CM

## 2019-10-26 DIAGNOSIS — I5042 Chronic combined systolic (congestive) and diastolic (congestive) heart failure: Secondary | ICD-10-CM | POA: Diagnosis present

## 2019-10-26 DIAGNOSIS — N183 Chronic kidney disease, stage 3 unspecified: Secondary | ICD-10-CM | POA: Diagnosis present

## 2019-10-26 DIAGNOSIS — K264 Chronic or unspecified duodenal ulcer with hemorrhage: Principal | ICD-10-CM | POA: Diagnosis present

## 2019-10-26 DIAGNOSIS — R7303 Prediabetes: Secondary | ICD-10-CM | POA: Diagnosis present

## 2019-10-26 DIAGNOSIS — K5900 Constipation, unspecified: Secondary | ICD-10-CM | POA: Diagnosis present

## 2019-10-26 DIAGNOSIS — I2583 Coronary atherosclerosis due to lipid rich plaque: Secondary | ICD-10-CM | POA: Diagnosis not present

## 2019-10-26 DIAGNOSIS — Z87891 Personal history of nicotine dependence: Secondary | ICD-10-CM | POA: Diagnosis not present

## 2019-10-26 DIAGNOSIS — I2510 Atherosclerotic heart disease of native coronary artery without angina pectoris: Secondary | ICD-10-CM | POA: Diagnosis present

## 2019-10-26 DIAGNOSIS — I255 Ischemic cardiomyopathy: Secondary | ICD-10-CM | POA: Diagnosis present

## 2019-10-26 DIAGNOSIS — Z6834 Body mass index (BMI) 34.0-34.9, adult: Secondary | ICD-10-CM | POA: Diagnosis not present

## 2019-10-26 DIAGNOSIS — Z20822 Contact with and (suspected) exposure to covid-19: Secondary | ICD-10-CM | POA: Diagnosis present

## 2019-10-26 DIAGNOSIS — D62 Acute posthemorrhagic anemia: Secondary | ICD-10-CM | POA: Diagnosis present

## 2019-10-26 DIAGNOSIS — Z79899 Other long term (current) drug therapy: Secondary | ICD-10-CM | POA: Diagnosis not present

## 2019-10-26 DIAGNOSIS — J449 Chronic obstructive pulmonary disease, unspecified: Secondary | ICD-10-CM | POA: Diagnosis not present

## 2019-10-26 DIAGNOSIS — Z91013 Allergy to seafood: Secondary | ICD-10-CM | POA: Diagnosis not present

## 2019-10-26 DIAGNOSIS — Z7901 Long term (current) use of anticoagulants: Secondary | ICD-10-CM

## 2019-10-26 DIAGNOSIS — D649 Anemia, unspecified: Secondary | ICD-10-CM | POA: Diagnosis not present

## 2019-10-26 DIAGNOSIS — K3189 Other diseases of stomach and duodenum: Secondary | ICD-10-CM | POA: Diagnosis not present

## 2019-10-26 LAB — CBC
HCT: 18.8 % — ABNORMAL LOW (ref 36.0–46.0)
HCT: 23.6 % — ABNORMAL LOW (ref 36.0–46.0)
Hemoglobin: 5.4 g/dL — CL (ref 12.0–15.0)
Hemoglobin: 7.5 g/dL — ABNORMAL LOW (ref 12.0–15.0)
MCH: 25.8 pg — ABNORMAL LOW (ref 26.0–34.0)
MCH: 26.4 pg (ref 26.0–34.0)
MCHC: 28.7 g/dL — ABNORMAL LOW (ref 30.0–36.0)
MCHC: 31.8 g/dL (ref 30.0–36.0)
MCV: 90 fL (ref 80.0–100.0)
MCV: 83.1 fL (ref 80.0–100.0)
Platelets: 205 10*3/uL (ref 150–400)
Platelets: 182 10*3/uL (ref 150–400)
RBC: 2.09 MIL/uL — ABNORMAL LOW (ref 3.87–5.11)
RBC: 2.84 MIL/uL — ABNORMAL LOW (ref 3.87–5.11)
RDW: 16.6 % — ABNORMAL HIGH (ref 11.5–15.5)
RDW: 16.4 % — ABNORMAL HIGH (ref 11.5–15.5)
WBC: 9.4 10*3/uL (ref 4.0–10.5)
WBC: 8.8 10*3/uL (ref 4.0–10.5)
nRBC: 0.2 % (ref 0.0–0.2)
nRBC: 0.2 % (ref 0.0–0.2)

## 2019-10-26 LAB — COMPREHENSIVE METABOLIC PANEL
ALT: 9 U/L (ref 0–44)
AST: 13 U/L — ABNORMAL LOW (ref 15–41)
Albumin: 3.2 g/dL — ABNORMAL LOW (ref 3.5–5.0)
Alkaline Phosphatase: 108 U/L (ref 38–126)
Anion gap: 14 (ref 5–15)
BUN: 26 mg/dL — ABNORMAL HIGH (ref 8–23)
CO2: 15 mmol/L — ABNORMAL LOW (ref 22–32)
Calcium: 8.6 mg/dL — ABNORMAL LOW (ref 8.9–10.3)
Chloride: 111 mmol/L (ref 98–111)
Creatinine, Ser: 1.33 mg/dL — ABNORMAL HIGH (ref 0.44–1.00)
GFR calc Af Amer: 46 mL/min — ABNORMAL LOW (ref >60)
GFR calc non Af Amer: 39 mL/min — ABNORMAL LOW (ref >60)
Glucose, Bld: 137 mg/dL — ABNORMAL HIGH (ref 70–99)
Potassium: 4.3 mmol/L (ref 3.5–5.1)
Sodium: 140 mmol/L (ref 135–145)
Total Bilirubin: 0.4 mg/dL (ref 0.3–1.2)
Total Protein: 6 g/dL — ABNORMAL LOW (ref 6.5–8.1)

## 2019-10-26 LAB — PROTIME-INR
INR: 1.5 — ABNORMAL HIGH (ref 0.8–1.2)
Prothrombin Time: 17.7 seconds — ABNORMAL HIGH (ref 11.4–15.2)

## 2019-10-26 LAB — PREPARE RBC (CROSSMATCH)

## 2019-10-26 LAB — SARS CORONAVIRUS 2 (TAT 6-24 HRS): SARS Coronavirus 2: NEGATIVE

## 2019-10-26 MED ORDER — ONDANSETRON HCL 4 MG/2ML IJ SOLN: 4.0000 mg | Freq: Four times a day (QID) | INTRAMUSCULAR | Status: DC | PRN

## 2019-10-26 MED ORDER — SODIUM CHLORIDE 0.9 % IV SOLN
80.0000 mg | Freq: Once | INTRAVENOUS | Status: AC
  Administered 2019-10-26: 80 mg via INTRAVENOUS
  Filled 2019-10-26: qty 80

## 2019-10-26 MED ORDER — ATORVASTATIN CALCIUM 10 MG PO TABS
10.0000 mg | ORAL_TABLET | Freq: Every day | ORAL | Status: DC
  Administered 2019-10-27 – 2019-10-29 (×3): 10 mg via ORAL
  Filled 2019-10-26 (×3): qty 1

## 2019-10-26 MED ORDER — SODIUM CHLORIDE 0.9 % IV SOLN
10.0000 mL/h | Freq: Once | INTRAVENOUS | Status: AC
  Administered 2019-10-26: 10 mL/h via INTRAVENOUS

## 2019-10-26 MED ORDER — SODIUM CHLORIDE 0.9 % IV SOLN
8.0000 mg/h | INTRAVENOUS | Status: DC
  Administered 2019-10-26: 8 mg/h via INTRAVENOUS
  Filled 2019-10-26 (×2): qty 80

## 2019-10-26 MED ORDER — LOSARTAN POTASSIUM 25 MG PO TABS
25.0000 mg | ORAL_TABLET | Freq: Two times a day (BID) | ORAL | Status: DC
  Administered 2019-10-26 – 2019-10-29 (×6): 25 mg via ORAL
  Filled 2019-10-26 (×6): qty 1

## 2019-10-26 MED ORDER — LACTATED RINGERS IV SOLN: INTRAVENOUS | Status: DC

## 2019-10-26 MED ORDER — PANTOPRAZOLE SODIUM 40 MG IV SOLR: 40.0000 mg | Freq: Two times a day (BID) | INTRAVENOUS | Status: DC

## 2019-10-26 MED ORDER — ACETAMINOPHEN 650 MG RE SUPP: 650.0000 mg | Freq: Four times a day (QID) | RECTAL | Status: DC | PRN

## 2019-10-26 MED ORDER — ALLOPURINOL 300 MG PO TABS
300.0000 mg | ORAL_TABLET | Freq: Every day | ORAL | Status: DC
  Administered 2019-10-27 – 2019-10-29 (×3): 300 mg via ORAL
  Filled 2019-10-26 (×3): qty 1

## 2019-10-26 MED ORDER — ONDANSETRON HCL 4 MG PO TABS: 4.0000 mg | ORAL_TABLET | Freq: Four times a day (QID) | ORAL | Status: DC | PRN

## 2019-10-26 MED ORDER — SODIUM CHLORIDE 0.9% FLUSH
3.0000 mL | Freq: Two times a day (BID) | INTRAVENOUS | Status: DC
  Administered 2019-10-26 – 2019-10-29 (×6): 3 mL via INTRAVENOUS

## 2019-10-26 MED ORDER — METOPROLOL SUCCINATE ER 50 MG PO TB24
50.0000 mg | ORAL_TABLET | Freq: Every day | ORAL | Status: DC
  Administered 2019-10-27 – 2019-10-29 (×3): 50 mg via ORAL
  Filled 2019-10-26 (×3): qty 1

## 2019-10-26 MED ORDER — ACETAMINOPHEN 325 MG PO TABS
650.0000 mg | ORAL_TABLET | Freq: Four times a day (QID) | ORAL | Status: DC | PRN
  Administered 2019-10-27 (×2): 650 mg via ORAL
  Filled 2019-10-26 (×2): qty 2

## 2019-10-26 NOTE — Consult Note (Signed)
[facility] Consult  Referring Provider: Karmen Bongo, MD Primary Care Physician:  [name], MD Primary Gastroenterologist: Dr.Brahmbhatt  Reason for Consultation: [place]  HPI: [name] [name] is a 74 y.o. female a benign esophageal stricture, dilated with 16.5 mm balloon on 10/10/2019, noted to have a 6 cm hiatal hernia, peptic duodenitis, mild chronic gastritis, no H. Pylori, colonoscopy from 10/11/2019 showed removal of tubular adenoma, multiple diverticulosis and internal hemorrhoid, presented to the ED with complaints of black stools for the past 1 week. She has progressively worsening shortness of breath, dizziness. She is on Xarelto for atrial fibrillation at home. She has not noted frank blood in stool, denies nausea, vomiting. Was discharged with a hemoglobin of 9.2 and presents to the ED with a hemoglobin of 5.4, elevated BUN/creatinine ratio of 26/1.33 suspicious for upper GI.   Past Medical History:  Diagnosis Date  . Arthritis   . Asthma    ??  . Atopic dermatitis   . Cardiac arrest ([place]) 10/2014   [name]  . Cardiomyopathy, ischemic    a. EF previously low, improved to LVEF 50-55% as of May 2018  . Chronic combined systolic and diastolic heart failure ([place])   . CKD (chronic kidney disease), stage III   . COPD (chronic obstructive pulmonary disease) ([place])   . Esophageal dilatation 2013  . GERD (gastroesophageal reflux disease)   . Gout   . Headache   . Hypertension   . Hypokalemia   . Idiopathic angioedema   . LGI bleed 08/06/2017   a. felt to be hemorrhoidal during that admission (no drop in Hgb).  . Lower back pain   . Persistent atrial fibrillation (HCC)    a. h/o difficult to control rates (complicated by noncompliance), not felt to be a candidate for ablation or antiarrhythmic due to noncompliance.  . Personal history of noncompliance with medical treatment, presenting hazards to health   . Prediabetes   . S/P CABG x 2 with clipping of LA  appendage 11/14/2014   LIMA to LAD, SVG to OM, EVH via right thigh  . Urine incontinence   . Uterine fibroid     Past Surgical History:  Procedure Laterality Date  . BALLOON DILATION N/A 10/10/2019   Procedure: BALLOON DILATION;  Surgeon: Otis Brace, MD;  Location: [facility];  Service: Gastroenterology;  Laterality: N/A;  . BIOPSY  10/10/2019   Procedure: BIOPSY;  Surgeon: Otis Brace, MD;  Location: [place];  Service: Gastroenterology;;  . BIOPSY  10/11/2019   Procedure: BIOPSY;  Surgeon: Otis Brace, MD;  Location: [place];  Service: Gastroenterology;;  . CARDIOVERSION N/A 11/18/2014   Procedure: CARDIOVERSION;  Surgeon: [name], MD;  Location: [place];  Service: Cardiovascular;  Laterality: N/A;  . CARDIOVERSION N/A 12/25/2017   Procedure: CARDIOVERSION;  Surgeon: [name], MD;  Location: [place];  Service: Cardiovascular;  Laterality: N/A;  . CLIPPING OF ATRIAL APPENDAGE N/A 11/14/2014   Procedure: CLIPPING OF ATRIAL APPENDAGE;  Surgeon: [name], MD;  Location: [place];  Service: Open Heart Surgery;  Laterality: N/A;  . COLONOSCOPY  2013  . COLONOSCOPY WITH PROPOFOL N/A 10/11/2019   Procedure: COLONOSCOPY WITH PROPOFOL;  Surgeon: Otis Brace, MD;  Location: [place];  Service: Gastroenterology;  Laterality: N/A;  . CORONARY ARTERY BYPASS GRAFT N/A 11/14/2014   Procedure: CORONARY ARTERY BYPASS GRAFTING (CABG)TIMES 2 USING LEFT INTERNAL MAMMARY ARTERY AND RIGHT SAPHENOUS VEIN HARVESTED ENDOSCOPICALLY;  Surgeon: [name], MD;  Location: [place];  Service:  Open Heart Surgery;  Laterality: N/A;  . ESOPHAGOGASTRODUODENOSCOPY (EGD) WITH PROPOFOL N/A 10/10/2019   Procedure: ESOPHAGOGASTRODUODENOSCOPY (EGD) WITH PROPOFOL;  Surgeon: Otis Brace, MD;  Location: [facility];  Service: Gastroenterology;  Laterality: N/A;  . LEFT HEART CATHETERIZATION WITH CORONARY ANGIOGRAM N/A 11/04/2014   Procedure: LEFT HEART CATHETERIZATION  WITH CORONARY ANGIOGRAM;  Surgeon: Troy Sine, MD;  Location[facility] CATH LAB;  Service: Cardiovascular;  Laterality: N/A;  . POLYPECTOMY  10/11/2019   Procedure: POLYPECTOMY;  Surgeon: Otis Brace, MD;  Location: [facility];  Service: Gastroenterology;;  . TEE WITHOUT CARDIOVERSION N/A 11/14/2014   Procedure: TRANSESOPHAGEAL ECHOCARDIOGRAM (TEE);  Surgeon: [name], MD;  Location: [place];  Service: Open Heart Surgery;  Laterality: N/A;  . TEE WITHOUT CARDIOVERSION N/A 12/25/2017   Procedure: TRANSESOPHAGEAL ECHOCARDIOGRAM (TEE);  Surgeon: [name], MD;  Location[facility] ENDOSCOPY;  Service: Cardiovascular;  Laterality: N/A;  . TEMPORARY PACEMAKER INSERTION  11/04/2014   Procedure: TEMPORARY PACEMAKER INSERTION;  Surgeon: Troy Sine, MD;  Location[facility] C[facility] LAB;  Service: Cardiovascular;;  . TOOTH EXTRACTION      Prior to Admission medications   Medication Sig Start Date End Date Taking? Authorizing Provider  acetaminophen (TYLENOL) 500 MG tablet Take 500 mg by mouth every 6 (six) hours as needed for moderate pain.    Yes [provider]  allopurinol (ZYLOPRIM) 300 MG tablet Take 1 tablet (300 mg total) by mouth daily. 01/04/18  Yes Georgiana Shore, NP  atorvastatin (LIPITOR) 10 MG tablet Take 10 mg by mouth daily.   Yes [provider]  furosemide (LASIX) 20 MG tablet Take 1 tablet (20 mg total) by mouth daily. 10/11/19 12/10/19 Yes Shelly Coss, MD  losartan (COZAAR) 25 MG tablet Take 1 tablet (25 mg total) by mouth 2 (two) times daily. 07/17/19  Yes Bensimhon, Shaune Pascal, MD  metoprolol succinate (TOPROL XL) 50 MG 24 hr tablet Take 1 tablet (50 mg total) by mouth daily. Take with or immediately following a meal. 10/11/19 10/10/20 Yes Adhikari, Amrit, MD  pantoprazole (PROTONIX) 40 MG tablet Take 40 mg by mouth daily.   Yes [provider]  rivaroxaban (XARELTO) 20 MG TABS tablet Take 1 tablet (20 mg total) by mouth daily with supper. Resume tomorrow  10/11/19  Yes Shelly Coss, MD  spironolactone (ALDACTONE) 25 MG tablet Take 1 tablet (25 mg total) by mouth daily. 07/17/19 10/26/19 Yes Bensimhon, Shaune Pascal, MD    Current Facility-Administered Medications  Medication Dose Route Frequency Provider Last Rate Last Admin  . acetaminophen (TYLENOL) tablet 650 mg  650 mg Oral Q6H PRN Karmen Bongo, MD       Or  . acetaminophen (TYLENOL) suppository 650 mg  650 mg Rectal Q6H PRN Karmen Bongo, MD      . Derrill Memo ON 10/27/2019] allopurinol (ZYLOPRIM) tablet 300 mg  300 mg Oral Daily Karmen Bongo, MD      . Derrill Memo ON 10/27/2019] atorvastatin (LIPITOR) tablet 10 mg  10 mg Oral Daily Karmen Bongo, MD      . lactated ringers infusion   Intravenous Continuous Karmen Bongo, MD   Stopped at 10/26/19 1503  . losartan (COZAAR) tablet 25 mg  25 mg Oral BID Karmen Bongo, MD      . metoprolol succinate (TOPROL-XL) 24 hr tablet 50 mg  50 mg Oral Daily Karmen Bongo, MD   Stopped at 10/26/19 1502  . ondansetron (ZOFRAN) tablet 4 mg  4 mg Oral Q6H PRN Karmen Bongo, MD  Or  . ondansetron (ZOFRAN) injection 4 mg  4 mg Intravenous Q6H PRN Karmen Bongo, MD      . pantoprazole (PROTONIX) 80 mg in sodium chloride 0.9 % 100 mL (0.8 mg/mL) infusion  8 mg/hr Intravenous Continuous Ronnette Juniper, MD      . pantoprazole (PROTONIX) 80 mg in sodium chloride 0.9 % 100 mL IVPB  80 mg Intravenous Once Ronnette Juniper, MD      . Derrill Memo ON 10/30/2019] pantoprazole (PROTONIX) injection 40 mg  40 mg Intravenous Q12H Ronnette Juniper, MD      . sodium chloride flush (NS) 0.9 % injection 3 mL  3 mL Intravenous Q12H Karmen Bongo, MD        Allergies as of 10/26/2019 - Review Complete 10/26/2019  Allergen Reaction Noted  . Bee venom Anaphylaxis 10/29/2014  . Codeine Nausea And Vomiting 02/25/2015  . Entresto [sacubitril-valsartan]  08/27/2019  . Shrimp [shellfish allergy] Swelling 10/29/2014    Family History  Problem Relation Age of Onset  . Cancer Mother         LYMPHOMA  . Heart disease Father   . Other Father        TB  . CVA Sister   . Prostate cancer Brother 87  . Diabetes Brother     Social History   Socioeconomic History  . Marital status: Widowed    Spouse name: Not on file  . Number of children: 1  . Years of education: 20  . Highest education level: Not on file  Occupational History  . Occupation: RETIRED [employer]  Tobacco Use  . Smoking status: Former Smoker    Packs/day: 0.10    Years: 56.00    Pack years: 5.60    Types: Cigarettes    Quit date: 2019    Years since quitting: 2.1  . Smokeless tobacco: Never Used  Substance and Sexual Activity  . Alcohol use: No    Alcohol/week: 0.0 standard drinks  . Drug use: No  . Sexual activity: Not on file  Other Topics Concern  . Not on file  Social History Narrative   Patient reports it being difficult to pay for everything due to outstanding medical bills from hospital stay last year but states she is able to keep up with basic expenses.  Patient does have some concerns with her house's condition due to a water leak- had her roof repaired last month but has some leaking in the front which has affected her porch.  Patient owns her own car and is able to drive herself but has some concerns about the reliability of her car- has gotten taxi to the clinic for appointment in the past when her car broke down.   Social Determinants of Health   Financial Resource Strain:   . Difficulty of Paying Living Expenses:   Food Insecurity:   . Worried About Charity fundraiser in the Last Year:   . Arboriculturist in the Last Year:   Transportation Needs:   . Film/video editor (Medical):   Marland Kitchen Lack of Transportation (Non-Medical):   Physical Activity:   . Days of Exercise per Week:   . Minutes of Exercise per Session:   Stress:   . Feeling of Stress :   Social Connections:   . Frequency of Communication with Friends and Family:   . Frequency of Social Gatherings with  Friends and Family:   . Attends Religious Services:   . Active Member of Clubs or  Organizations:   . Attends Archivist Meetings:   Marland Kitchen Marital Status:   Intimate Partner Violence:   . Fear of Current or Ex-Partner:   . Emotionally Abused:   Marland Kitchen Physically Abused:   . Sexually Abused:     Review of Systems: Positive for: GI: Described in detail in HPI.    Gen:  fatigue, weakness, malaise,Denies any fever, chills, rigors, night sweats, anorexia, involuntary weight loss, and sleep disorder CV: Denies chest pain, angina, palpitations, syncope, orthopnea, PND, peripheral edema, and claudication. Resp: Denies dyspnea, cough, sputum, wheezing, coughing up blood. GU : Denies urinary burning, blood in urine, urinary frequency, urinary hesitancy, nocturnal urination, and urinary incontinence. MS: Denies joint pain or swelling.  Denies muscle weakness, cramps, atrophy.  Derm: Denies rash, itching, oral ulcerations, hives, unhealing ulcers.  Psych: Denies depression, anxiety, memory loss, suicidal ideation, hallucinations,  and confusion. Heme: Denies bruising and enlarged lymph nodes. Neuro:  Denies any headaches, dizziness, paresthesias. Endo:  Denies any problems with DM, thyroid, adrenal function.  Physical Exam: Vital signs in last 24 hours: Temp:  [98.3 F (36.8 C)-99 F (37.2 C)] 98.4 F (36.9 C) (03/13 1644) Pulse Rate:  [74-84] 74 (03/13 1644) Resp:  [16-24] 19 (03/13 1644) BP: (119-165)/(48-89) 135/48 (03/13 1644) SpO2:  [100 %] 100 % (03/13 1644) Weight:  [101.2 kg] 101.2 kg (03/13 1644)    General:   Alert,  Well-developed, obese, pleasant and cooperative in NAD Head:  Normocephalic and atraumatic. Eyes:  Sclera clear, no icterus.  Prominent pallor Ears:  Normal auditory acuity. Nose:  No deformity, discharge,  or lesions. Mouth:  No deformity or lesions.  Oropharynx pink & moist. Neck:  Supple; no masses or thyromegaly. Lungs:  Clear throughout to auscultation.    No wheezes, crackles, or rhonchi. No acute distress. Heart:  Regular rate and rhythm; no murmurs, clicks, rubs,  or gallops. Extremities:  Without clubbing or edema. Neurologic:  Alert and  oriented x4;  grossly normal neurologically. Skin:  Intact without significant lesions or rashes. Psych:  Alert and cooperative. Normal mood and affect. Abdomen:  Soft, nontender and nondistended. No masses, hepatosplenomegaly or hernias noted. Normal bowel sounds, without guarding, and without rebound.         Lab Results: Recent Labs    10/26/19 1112  WBC 9.4  HGB 5.4*  HCT 18.8*  PLT 205   BMET Recent Labs    10/26/19 1112  NA 140  K 4.3  CL 111  CO2 15*  GLUCOSE 137*  BUN 26*  CREATININE 1.33*  CALCIUM 8.6*   LFT Recent Labs    10/26/19 1112  PROT 6.0*  ALBUMIN 3.2*  AST 13*  ALT 9  ALKPHOS 108  BILITOT 0.4   PT/INR No results for input(s): LABPROT, INR in the last 72 hours.  Studies/Results: No results found.  Impression: Symptomatic anemia, black stools, elevated BUN/creatinine ratio suspicious for upper GI bleeding History of atrial fibrillation, last dose of Xarelto yesterday  Plan: Clear liquid diet today. IV Protonix drip. 2 Units PRBC transfusion. Monitor hemodynamic and H&H, confused to keep hemoglobin more than 7 N.p.o. post midnight for EGD, EGD is unremarkable will deploy PillCam. The risks and the benefits of the procedure were discussed with the patient in detail. She understands and verbalizes consent.    LOS: 0 days   Ronnette Juniper, MD  10/26/2019, 5:24 PM

## 2019-10-26 NOTE — ED Provider Notes (Signed)
[facility] EMERGENCY DEPARTMENT Provider Note   CSN: [id number] Arrival date & time: 10/26/19  1047     History Chief Complaint  Patient presents with  . GI Bleeding    [name] [name] is a 74 y.o. female.  74yo F w/ PMH including A fib on Xarelto, CAD s/p CABG, CKD, COPD who p/w GI bleeding. Pt reports 3-4 days of melena associated w/ generalized weakness, fatigue with exertion, and feeling bad. She was just discharged from the hospital on 2/26 after similar GI bleeding. She is on Xarelto for A fib. She denies any diarrhea/constipation, chest pain, SOB, or abdominal pain.   The history is provided by the patient.       Past Medical History:  Diagnosis Date  . A-fib (HCC)    RVR- POST CABG- RESOLVED. S/P NSTEMI 3/16  . Acute respiratory failure ([place]) 08/2017  . Arthritis   . Asthma    ??  . Atopic dermatitis   . CAD (coronary artery disease)    a. s/p CABG 2016.  . Cardiac arrest ([place]) 10/2014   [name]  . Cardiomyopathy, ischemic    a. EF previously low, improved to LVEF 50-55% as of May 2018  . Chronic combined systolic and diastolic heart failure ([place])   . CKD (chronic kidney disease), stage III   . COPD (chronic obstructive pulmonary disease) ([place])   . Esophageal dilatation 2013  . GERD (gastroesophageal reflux disease)   . Gout   . Headache   . Hypertension   . Hypokalemia   . Idiopathic angioedema   . LGI bleed 08/06/2017   a. felt to be hemorrhoidal during that admission (no drop in Hgb).  . Lower back pain   . Persistent atrial fibrillation (HCC)    a. h/o difficult to control rates (complicated by noncompliance), not felt to be a candidate for ablation or antiarrhythmic due to noncompliance.  . Personal history of noncompliance with medical treatment, presenting hazards to health   . Prediabetes   . S/P CABG x 2 with clipping of LA appendage 11/14/2014   LIMA to LAD, SVG to OM, EVH via right thigh  . Urine incontinence   . Uterine  fibroid     Patient Active Problem List   Diagnosis Date Noted  . Acute GI bleeding 10/09/2019  . Acute blood loss anemia 10/09/2019  . Postmenopausal vaginal bleeding 01/04/2018  . Snoring 01/04/2018  . Acute on chronic combined systolic and diastolic CHF (congestive heart failure) ([place])   . CHF exacerbation ([place]) 10/16/2017  . Noncompliance 10/16/2017  . Acute on chronic congestive heart failure ([place])   . Influenza with respiratory manifestation flu a + 09/14/16 started on tamiflu 09/15/17 09/19/2017  . Thrombocytopenia ([place]) 09/14/2017  . Acute drug-induced gout of right foot   . Medication noncompliance due to cognitive impairment 09/06/2017  . Acute diastolic CHF (congestive heart failure) ([place]) 08/06/2017  . LGI bleed, likely hemorrhoids 08/06/2017  . Atrial fibrillation ([place]) 08/04/2017  . Paroxysmal atrial fibrillation ([place]) 02/08/2017  . Cardiomyopathy, ischemic 02/08/2017  . Dysphagia 02/08/2017  . CKD (chronic kidney disease), stage III 01/30/2017  . Pain in shoulder 02/08/2016  . Breast pain, left 02/08/2016  . Bilateral arm numbness and tingling while sleeping 02/08/2016  . Painful lumpy left breast 09/23/2015  . [place] intertrigo 02/11/2015  . S/P CABG x 2 11/14/2014  . Accelerated hypertension   . Cardiac arrest ([place]) 11/04/2014  . Left main coronary artery disease 11/04/2014  . Coronary  artery disease due to lipid rich plaque   . Acute respiratory failure with hypoxemia ([place])   . Essential hypertension   . Atrial fibrillation with RVR ([place])   . Hypokalemia 10/30/2014  . Chronic diastolic CHF (congestive heart failure) ([place]) 10/30/2014  . NSTEMI (non-ST elevated myocardial infarction) ([place])   . DOE (dyspnea on exertion)   . CAP (community acquired pneumonia) 10/29/2014    Past Surgical History:  Procedure Laterality Date  . BALLOON DILATION N/A 10/10/2019   Procedure: BALLOON DILATION;  Surgeon: Otis Brace, MD;  Location: [facility];  Service:  Gastroenterology;  Laterality: N/A;  . BIOPSY  10/10/2019   Procedure: BIOPSY;  Surgeon: Otis Brace, MD;  Location: [facility];  Service: Gastroenterology;;  . BIOPSY  10/11/2019   Procedure: BIOPSY;  Surgeon: Otis Brace, MD;  Location: [facility];  Service: Gastroenterology;;  . CARDIOVERSION N/A 11/18/2014   Procedure: CARDIOVERSION;  Surgeon: [name], MD;  Location: [facility];  Service: Cardiovascular;  Laterality: N/A;  . CARDIOVERSION N/A 12/25/2017   Procedure: CARDIOVERSION;  Surgeon: [name], MD;  Location: [facility];  Service: Cardiovascular;  Laterality: N/A;  . CLIPPING OF ATRIAL APPENDAGE N/A 11/14/2014   Procedure: CLIPPING OF ATRIAL APPENDAGE;  Surgeon: [name], MD;  Location: [facility];  Service: Open Heart Surgery;  Laterality: N/A;  . COLONOSCOPY  2013  . COLONOSCOPY WITH PROPOFOL N/A 10/11/2019   Procedure: COLONOSCOPY WITH PROPOFOL;  Surgeon: Otis Brace, MD;  Location: [facility];  Service: Gastroenterology;  Laterality: N/A;  . CORONARY ARTERY BYPASS GRAFT N/A 11/14/2014   Procedure: CORONARY ARTERY BYPASS GRAFTING (CABG)TIMES 2 USING LEFT INTERNAL MAMMARY ARTERY AND RIGHT SAPHENOUS VEIN HARVESTED ENDOSCOPICALLY;  Surgeon: [name], MD;  Location: [facility];  Service: Open Heart Surgery;  Laterality: N/A;  . ESOPHAGOGASTRODUODENOSCOPY (EGD) WITH PROPOFOL N/A 10/10/2019   Procedure: ESOPHAGOGASTRODUODENOSCOPY (EGD) WITH PROPOFOL;  Surgeon: Otis Brace, MD;  Location: [facility];  Service: Gastroenterology;  Laterality: N/A;  . LEFT HEART CATHETERIZATION WITH CORONARY ANGIOGRAM N/A 11/04/2014   Procedure: LEFT HEART CATHETERIZATION WITH CORONARY ANGIOGRAM;  Surgeon: Troy Sine, MD;  Location: [facility] CATH LAB;  Service: Cardiovascular;  Laterality: N/A;  . POLYPECTOMY  10/11/2019   Procedure: POLYPECTOMY;  Surgeon: Otis Brace, MD;  Location: [facility];  Service: Gastroenterology;;  . TEE WITHOUT CARDIOVERSION N/A  11/14/2014   Procedure: TRANSESOPHAGEAL ECHOCARDIOGRAM (TEE);  Surgeon: [name], MD;  Location: [facility];  Service: Open Heart Surgery;  Laterality: N/A;  . TEE WITHOUT CARDIOVERSION N/A 12/25/2017   Procedure: TRANSESOPHAGEAL ECHOCARDIOGRAM (TEE);  Surgeon: [name], MD;  Location[facility] END[facility]COPY;  Service: Cardiovascular;  Laterality: N/A;  . TEMPORARY PACEMAKER INSERTION  11/04/2014   Procedure: TEMPORARY PACEMAKER INSERTION;  Surgeon: Troy Sine, MD;  Location: [facility] CATH LAB;  Service: Cardiovascular;;  . TOOTH EXTRACTION       OB History   No obstetric history on file.     Family History  Problem Relation Age of Onset  . Cancer Mother        LYMPHOMA  . Heart disease Father   . Other Father        TB  . CVA Sister   . Prostate cancer Brother 67  . Diabetes Brother     Social History   Tobacco Use  . Smoking status: Former Smoker    Packs/day: 0.10    Years: 56.00    Pack years: 5.60    Types: Cigarettes    Quit  date: 2019    Years since quitting: 2.1  . Smokeless tobacco: Never Used  Substance Use Topics  . Alcohol use: No    Alcohol/week: 0.0 standard drinks  . Drug use: No    Home Medications Prior to Admission medications   Medication Sig Start Date End Date Taking? Authorizing Provider  acetaminophen (TYLENOL) 500 MG tablet Take 500 mg by mouth every 6 (six) hours as needed for moderate pain.     [provider]  allopurinol (ZYLOPRIM) 300 MG tablet Take 1 tablet (300 mg total) by mouth daily. 01/04/18   Georgiana Shore, NP  atorvastatin (LIPITOR) 10 MG tablet Take 10 mg by mouth daily.    [provider]  furosemide (LASIX) 20 MG tablet Take 1 tablet (20 mg total) by mouth daily. 10/11/19 12/10/19  Shelly Coss, MD  losartan (COZAAR) 25 MG tablet Take 1 tablet (25 mg total) by mouth 2 (two) times daily. 07/17/19   Bensimhon, Shaune Pascal, MD  metoprolol succinate (TOPROL XL) 50 MG 24 hr tablet Take 1 tablet (50 mg total) by mouth  daily. Take with or immediately following a meal. 10/11/19 10/10/20  Shelly Coss, MD  pantoprazole (PROTONIX) 40 MG tablet Take 40 mg by mouth daily.    [provider]  rivaroxaban (XARELTO) 20 MG TABS tablet Take 1 tablet (20 mg total) by mouth daily with supper. Resume tomorrow 10/11/19   Shelly Coss, MD  spironolactone (ALDACTONE) 25 MG tablet Take 1 tablet (25 mg total) by mouth daily. 07/17/19 10/15/19  Bensimhon, Shaune Pascal, MD    Allergies    Bee venom, Codeine, Entresto [sacubitril-valsartan], and Shrimp [shellfish allergy]  Review of Systems   Review of Systems All other systems reviewed and are negative except that which was mentioned in HPI  Physical Exam Updated Vital Signs BP (!) 119/58 (BP Location: Left Arm)   Pulse 84   Temp 99 F (37.2 C) (Oral)   Resp 16   SpO2 100%   Physical Exam Vitals and nursing note reviewed.  Constitutional:      General: She is not in acute distress.    Appearance: She is well-developed.  HENT:     Head: Normocephalic and atraumatic.     Mouth/Throat:     Comments: Pale tongue Eyes:     Comments: Pale conjunctivae  Cardiovascular:     Rate and Rhythm: Normal rate and regular rhythm.     Heart sounds: Normal heart sounds. No murmur.  Pulmonary:     Effort: Pulmonary effort is normal.     Breath sounds: Normal breath sounds.  Abdominal:     General: Bowel sounds are normal. There is no distension.     Palpations: Abdomen is soft.     Tenderness: There is no abdominal tenderness.  Musculoskeletal:     Cervical back: Neck supple.     Comments: Mild BLE edema  Skin:    General: Skin is warm and dry.  Neurological:     Mental Status: She is alert and oriented to person, place, and time.     Comments: Fluent speech  Psychiatric:        Judgment: Judgment normal.     ED Results / Procedures / Treatments   Labs (all labs ordered are listed, but only abnormal results are displayed) Labs Reviewed  COMPREHENSIVE  METABOLIC PANEL - Abnormal; Notable for the following components:      Result Value   CO2 15 (*)    Glucose, Bld 137 (*)  BUN 26 (*)    Creatinine, Ser 1.33 (*)    Calcium 8.6 (*)    Total Protein 6.0 (*)    Albumin 3.2 (*)    AST 13 (*)    GFR calc non Af Amer 39 (*)    GFR calc Af Amer 46 (*)    All other components within normal limits  CBC - Abnormal; Notable for the following components:   RBC 2.09 (*)    Hemoglobin 5.4 (*)    HCT 18.8 (*)    MCH 25.8 (*)    MCHC 28.7 (*)    RDW 16.6 (*)    All other components within normal limits  SARS CORONAVIRUS 2 (TAT 6-24 HRS)  CBC  POC OCCULT BLOOD, ED  TYPE AND SCREEN  PREPARE RBC (CROSSMATCH)    EKG EKG Interpretation  Date/Time:  Saturday October 26 2019 11:06:45 EST Ventricular Rate:  92 PR Interval:  134 QRS Duration: 90 QT Interval:  366 QTC Calculation: 452 R Axis:   26 Text Interpretation: Sinus rhythm with occasional Premature ventricular complexes ST & T wave abnormality, consider lateral ischemia Abnormal ECG rate slower than previous, occasional PVC Confirmed by Theotis Burrow [id number]) on 10/26/2019 11:42:04 AM   Radiology No results found.  Procedures .Critical Care Performed by: Sharlett Iles, MD Authorized by: Sharlett Iles, MD   Critical care provider statement:    Critical care time (minutes):  30   Critical care time was exclusive of:  Separately billable procedures and treating other patients   Critical care was necessary to treat or prevent imminent or life-threatening deterioration of the following conditions: GI bleeding.   Critical care was time spent personally by me on the following activities:  Blood draw for specimens, development of treatment plan with patient or surrogate, discussions with consultants, evaluation of patient's response to treatment, examination of patient, obtaining history from patient or surrogate, ordering and performing treatments and interventions,  ordering and review of laboratory studies, ordering and review of radiographic studies, re-evaluation of patient's condition and review of old charts   (including critical care time)  Medications Ordered in ED Medications  0.9 %  sodium chloride infusion (has no administration in time range)  allopurinol (ZYLOPRIM) tablet 300 mg (has no administration in time range)  atorvastatin (LIPITOR) tablet 10 mg (has no administration in time range)  losartan (COZAAR) tablet 25 mg (has no administration in time range)  metoprolol succinate (TOPROL-XL) 24 hr tablet 50 mg (has no administration in time range)  acetaminophen (TYLENOL) tablet 650 mg (has no administration in time range)    Or  acetaminophen (TYLENOL) suppository 650 mg (has no administration in time range)  ondansetron (ZOFRAN) tablet 4 mg (has no administration in time range)    Or  ondansetron (ZOFRAN) injection 4 mg (has no administration in time range)  pantoprazole (PROTONIX) injection 40 mg (has no administration in time range)  sodium chloride flush (NS) 0.9 % injection 3 mL (has no administration in time range)  lactated ringers infusion (has no administration in time range)    ED Course  I have reviewed the triage vital signs and the nursing notes.  Pertinent labs & imaging results that were available during my care of the patient were reviewed by me and considered in my medical decision making (see chart for details).    MDM Rules/Calculators/A&P                      Alert,  stable vital signs, no abdominal tenderness on exam.  Lab work today is notable for BUN 26, creatinine 1.33, hemoglobin 5.4, normal WBC count.  I reviewed her chart including recent upper and lower endoscopy by [facility]. I have ordered 2u pRBCs, no hemodynamic instability to warrant rapid anticoagulation reversal. Called [facility] and they will see pt in consultation. Discussed admission w/ [facility], Dr. Lorin Mercy, who will admit for further care.  Final  Clinical Impression(s) / ED Diagnoses Final diagnoses:  Upper GI bleed    Rx / DC Orders ED Discharge Orders    None       Snyder Colavito, Wenda Overland, MD 10/26/19 1437

## 2019-10-26 NOTE — H&P (Signed)
History and Physical    [name] [name] [id number] DOB: 1945/12/04 DOA: 10/26/2019  PCP: [name], MD Consultants:  Skains/Bensihmon - cardiology Patient coming from:  Home - lives alone; NOK: Daughter, Shanautica Trush, [phone]   Chief Complaint: Tarry stools  HPI: [name] [name] is a 74 y.o. female with medical history significant of CAD s/p CABG; afib on Xarelto; HTN; COPD; stage 3 CKD; and chronic combined CHF presenting with weakness and recurrent tarry stools.  She was previously hospitalized from 2/23-26 for UGI bleeding.  EGD was performed and did not show a bleeding source.  Colonoscopy was performed with diverticulosis and a few polyps with internal hemorrhoids, but also without active bleeding.   She was better for a couple of days and stools were not dark after discharge, but her stools turned black again on Tuesday.  She called her PCP on Wednesday.  She started feeling light-headed and dizzy last night.  +SOB, can't even walk between rooms because she is too tired. No abdominal pain.  No bright red blood. No n/v.  No fevers.   ED Course:  Bounceback for GI bleeding - on Xarelto.  Started on Protonix, weak and tired with black stools.  Hgb 5.  Very pale, no abdominal pain.  [facility] consulted again.  Review of Systems: As per HPI; otherwise review of systems reviewed and negative.   Ambulatory Status:  Ambulates without assistance  Past Medical History:  Diagnosis Date  . Arthritis   . Asthma    ??  . Atopic dermatitis   . Cardiac arrest ([place]) 10/2014   [name]  . Cardiomyopathy, ischemic    a. EF previously low, improved to LVEF 50-55% as of May 2018  . Chronic combined systolic and diastolic heart failure ([place])   . CKD (chronic kidney disease), stage III   . COPD (chronic obstructive pulmonary disease) ([place])   . Esophageal dilatation 2013  . GERD (gastroesophageal reflux disease)   . Gout   . Headache   . Hypertension   . Hypokalemia   . Idiopathic  angioedema   . LGI bleed 08/06/2017   a. felt to be hemorrhoidal during that admission (no drop in Hgb).  . Lower back pain   . Persistent atrial fibrillation (HCC)    a. h/o difficult to control rates (complicated by noncompliance), not felt to be a candidate for ablation or antiarrhythmic due to noncompliance.  . Personal history of noncompliance with medical treatment, presenting hazards to health   . Prediabetes   . S/P CABG x 2 with clipping of LA appendage 11/14/2014   LIMA to LAD, SVG to OM, EVH via right thigh  . Urine incontinence   . Uterine fibroid     Past Surgical History:  Procedure Laterality Date  . BALLOON DILATION N/A 10/10/2019   Procedure: BALLOON DILATION;  Surgeon: Otis Brace, MD;  Location: [facility];  Service: Gastroenterology;  Laterality: N/A;  . BIOPSY  10/10/2019   Procedure: BIOPSY;  Surgeon: Otis Brace, MD;  Location: [place];  Service: Gastroenterology;;  . BIOPSY  10/11/2019   Procedure: BIOPSY;  Surgeon: Otis Brace, MD;  Location: [place];  Service: Gastroenterology;;  . CARDIOVERSION N/A 11/18/2014   Procedure: CARDIOVERSION;  Surgeon: [name], MD;  Location: [place];  Service: Cardiovascular;  Laterality: N/A;  . CARDIOVERSION N/A 12/25/2017   Procedure: CARDIOVERSION;  Surgeon: [name], MD;  Location: [place];  Service: Cardiovascular;  Laterality: N/A;  . CLIPPING OF ATRIAL APPENDAGE  N/A 11/14/2014   Procedure: CLIPPING OF ATRIAL APPENDAGE;  Surgeon: [name], MD;  Location: [place];  Service: Open Heart Surgery;  Laterality: N/A;  . COLONOSCOPY  2013  . COLONOSCOPY WITH PROPOFOL N/A 10/11/2019   Procedure: COLONOSCOPY WITH PROPOFOL;  Surgeon: Otis Brace, MD;  Location: [place];  Service: Gastroenterology;  Laterality: N/A;  . CORONARY ARTERY BYPASS GRAFT N/A 11/14/2014   Procedure: CORONARY ARTERY BYPASS GRAFTING (CABG)TIMES 2 USING LEFT INTERNAL MAMMARY ARTERY AND RIGHT SAPHENOUS VEIN  HARVESTED ENDOSCOPICALLY;  Surgeon: [name], MD;  Location: [place];  Service: Open Heart Surgery;  Laterality: N/A;  . ESOPHAGOGASTRODUODENOSCOPY (EGD) WITH PROPOFOL N/A 10/10/2019   Procedure: ESOPHAGOGASTRODUODENOSCOPY (EGD) WITH PROPOFOL;  Surgeon: Otis Brace, MD;  Location: [facility];  Service: Gastroenterology;  Laterality: N/A;  . LEFT HEART CATHETERIZATION WITH CORONARY ANGIOGRAM N/A 11/04/2014   Procedure: LEFT HEART CATHETERIZATION WITH CORONARY ANGIOGRAM;  Surgeon: Troy Sine, MD;  Location: [facility] CATH LAB;  Service: Cardiovascular;  Laterality: N/A;  . POLYPECTOMY  10/11/2019   Procedure: POLYPECTOMY;  Surgeon: Otis Brace, MD;  Location: [facility]OSCOPY;  Service: Gastroenterology;;  . TEE WITHOUT CARDIOVERSION N/A 11/14/2014   Procedure: TRANSESOPHAGEAL ECHOCARDIOGRAM (TEE);  Surgeon: [name], MD;  Location: [place];  Service: Open Heart Surgery;  Laterality: N/A;  . TEE WITHOUT CARDIOVERSION N/A 12/25/2017   Procedure: TRANSESOPHAGEAL ECHOCARDIOGRAM (TEE);  Surgeon: [name], MD;  Location: [facility] ENDOSCOPY;  Service: Cardiovascular;  Laterality: N/A;  . TEMPORARY PACEMAKER INSERTION  11/04/2014   Procedure: TEMPORARY PACEMAKER INSERTION;  Surgeon: Troy Sine, MD;  Location: [facility] CATH LAB;  Service: Cardiovascular;;  . TOOTH EXTRACTION      Social History   Socioeconomic History  . Marital status: Widowed    Spouse name: Not on file  . Number of children: 1  . Years of education: 45  . Highest education level: Not on file  Occupational History  . Occupation: RETIRED [employer]  Tobacco Use  . Smoking status: Former Smoker    Packs/day: 0.10    Years: 56.00    Pack years: 5.60    Types: Cigarettes    Quit date: 2019    Years since quitting: 2.1  . Smokeless tobacco: Never Used  Substance and Sexual Activity  . Alcohol use: No    Alcohol/week: 0.0 standard drinks  . Drug use: No  . Sexual activity: Not on file  Other Topics  Concern  . Not on file  Social History Narrative   Patient reports it being difficult to pay for everything due to outstanding medical bills from hospital stay last year but states she is able to keep up with basic expenses.  Patient does have some concerns with her house's condition due to a water leak- had her roof repaired last month but has some leaking in the front which has affected her porch.  Patient owns her own car and is able to drive herself but has some concerns about the reliability of her car- has gotten taxi to the clinic for appointment in the past when her car broke down.   Social Determinants of Health   Financial Resource Strain:   . Difficulty of Paying Living Expenses:   Food Insecurity:   . Worried About Charity fundraiser in the Last Year:   . Arboriculturist in the Last Year:   Transportation Needs:   . Film/video editor (Medical):   Marland Kitchen Lack of Transportation (Non-Medical):  Physical Activity:   . Days of Exercise per Week:   . Minutes of Exercise per Session:   Stress:   . Feeling of Stress :   Social Connections:   . Frequency of Communication with Friends and Family:   . Frequency of Social Gatherings with Friends and Family:   . Attends Religious Services:   . Active Member of Clubs or Organizations:   . Attends Archivist Meetings:   Marland Kitchen Marital Status:   Intimate Partner Violence:   . Fear of Current or Ex-Partner:   . Emotionally Abused:   Marland Kitchen Physically Abused:   . Sexually Abused:     Allergies  Allergen Reactions  . Bee Venom Anaphylaxis  . Codeine Nausea And Vomiting  . Entresto [Sacubitril-Valsartan]     Chest pain   . Shrimp [Shellfish Allergy] Swelling    Family History  Problem Relation Age of Onset  . Cancer Mother        LYMPHOMA  . Heart disease Father   . Other Father        TB  . CVA Sister   . Prostate cancer Brother 83  . Diabetes Brother     Prior to Admission medications   Medication Sig Start Date  End Date Taking? Authorizing Provider  acetaminophen (TYLENOL) 500 MG tablet Take 500 mg by mouth every 6 (six) hours as needed for moderate pain.     [provider]  allopurinol (ZYLOPRIM) 300 MG tablet Take 1 tablet (300 mg total) by mouth daily. 01/04/18   Georgiana Shore, NP  atorvastatin (LIPITOR) 10 MG tablet Take 10 mg by mouth daily.    [provider]  furosemide (LASIX) 20 MG tablet Take 1 tablet (20 mg total) by mouth daily. 10/11/19 12/10/19  Shelly Coss, MD  losartan (COZAAR) 25 MG tablet Take 1 tablet (25 mg total) by mouth 2 (two) times daily. 07/17/19   Bensimhon, Shaune Pascal, MD  metoprolol succinate (TOPROL XL) 50 MG 24 hr tablet Take 1 tablet (50 mg total) by mouth daily. Take with or immediately following a meal. 10/11/19 10/10/20  Shelly Coss, MD  pantoprazole (PROTONIX) 40 MG tablet Take 40 mg by mouth daily.    [provider]  rivaroxaban (XARELTO) 20 MG TABS tablet Take 1 tablet (20 mg total) by mouth daily with supper. Resume tomorrow 10/11/19   Shelly Coss, MD  spironolactone (ALDACTONE) 25 MG tablet Take 1 tablet (25 mg total) by mouth daily. 07/17/19 10/15/19  Bensimhon, Shaune Pascal, MD    Physical Exam: Vitals:   10/26/19 1317 10/26/19 1330 10/26/19 1335 10/26/19 1400  BP: (!) 156/64 (!) 165/81  134/89  Pulse: 77 83 76 83  Resp: (!) 24 (!) 22 (!) 23 (!) 21  Temp:   98.9 F (37.2 C)   TempSrc:   Oral   SpO2: 100% 100%       . General:  Appears calm and comfortable and is NAD . Eyes:  PERRL, EOMI, normal lids, iris; + conjunctival pallor . ENT:  grossly normal hearing, lips & tongue, mmm . Neck:  no LAD, masses or thyromegaly . Cardiovascular:  RRR, no m/r/g. No LE edema.  Marland Kitchen Respiratory:   CTA bilaterally with no wheezes/rales/rhonchi.  Normal respiratory effort. . Abdomen:  soft, NT, ND, NABS . Skin:  no rash or induration seen on limited exam . Musculoskeletal:  grossly normal tone BUE/BLE, good ROM, no bony  abnormality . Psychiatric:  grossly normal mood and affect, speech fluent  and appropriate, AOx3 . Neurologic:  CN 2-12 grossly intact, moves all extremities in coordinated fashion    Radiological Exams on Admission: No results found.  EKG: Independently reviewed.  NSR with rate 92; nonspecific ST changes with no evidence of acute ischemia   Labs on Admission: I have personally reviewed the available labs and imaging studies at the time of the admission.  Pertinent labs:   CO2 15 Glucose 137 BUN 26/Creatinine 1.33/GFR 46; 19/1.45/41 on 2/25 WBC 9.4 Hgb 5.4; 9.2 on 2/26   Assessment/Plan Principal Problem:   Acute upper GI bleeding Active Problems:   Chronic combined systolic and diastolic CHF (congestive heart failure) (HCC)   Essential hypertension   Coronary artery disease due to lipid rich plaque   CKD (chronic kidney disease), stage III   Paroxysmal atrial fibrillation (HCC)   COPD (chronic obstructive pulmonary disease) (HCC)   Recurrent probable upper GI bleeding -Patient with recent admission for the same; EGD and colonoscopy did not reveal a bleeding source -She was better for a couple of days but again developed tarry stools a few days ago and got light-headed/dizzy yesterday -Hgb is 5.4 -Will resume Protonix BID -Transfusing 2 units PRBC; will recheck CBC tonight and tomorrow AM GI consult is pending -At this time, possible ways to explore the source of bleeding appear to include: repeat EGD with push enteroscopy; capsule endoscopy; tagged RBC scan -Will hold Xarelto until bleeding source has been identified or for at least 5 days  -Her daughter reports that she has severe GERD symptoms and takes extra medications for this issue  Atrial fibrillation, on AC -Rate controlled on Toprol XL -Hold Xarelto due to bleeding  Chronic combined CHF/CAD -Appears euvolemic at this time, but will be NPO and getting IVF and blood so will watch for evidence of volume  overload -Continue Lipitor -s/p CABG -Recent EF had normalized  HTN -Continue Cozaar (takes BID, consider changing to once daily) and Toprol  COPD -She does not appear to be taking medication for this issue at this time  Stage 3 CKD -Appears to be at/slightly better than usual baseline creatinine of 1.4-1.5 -Will follow    Also of note: hospitals are terrifying to her.  Her grandmother told her that hospitals are where you go to die.  She will press to go home as soon as possible.   Note: This patient has been tested and is pending for the novel coronavirus COVID-19.  DVT prophylaxis: SCDs Code Status:  Full - confirmed with patient Family Communication: None present; I spoke with the patient's daughter by telephone at the time of admission. Disposition Plan: She is anticipated to d/c to home without [facility] services once her GI issues have been resolved. Consults called: GI  Admission status: Admit - It is my clinical opinion that admission to INPATIENT is reasonable and necessary because of the expectation that this patient will require hospital care that crosses at least 2 midnights to treat this condition based on the medical complexity of the problems presented.  Given the aforementioned information, the predictability of an adverse outcome is felt to be significant.     Karmen Bongo MD [facility]   How to contact the [facility] Attending or Consulting provider [place] or covering provider during after hours [place], for this patient?  1. Check the care team in [facility] and look for a) attending/consulting [facility] provider listed and b) the [facility] team listed 2. Log into [email or web address] and use [facility]'s universal password to  access. If you do not have the password, please contact the hospital operator. 3. Locate the [facility] provider you are looking for under [facility] and page to a number that you can be directly reached. 4. If you still have difficulty reaching the provider,  please page the [facility] (Director on Call) for the Hospitalists listed on amion for assistance.   10/26/2019, 2:22 PM

## 2019-10-26 NOTE — H&P (View-Only) (Signed)
[facility] Consult  Referring Provider: Karmen Bongo, MD Primary Care Physician:  [name], MD Primary Gastroenterologist: Dr.Brahmbhatt  Reason for Consultation: Melena  HPI: [name] [name] is a 74 y.o. female a benign esophageal stricture, dilated with 16.5 mm balloon on 10/10/2019, noted to have a 6 cm hiatal hernia, peptic duodenitis, mild chronic gastritis, no H. Pylori, colonoscopy from 10/11/2019 showed removal of tubular adenoma, multiple diverticulosis and internal hemorrhoid, presented to the ED with complaints of black stools for the past 1 week. She has progressively worsening shortness of breath, dizziness. She is on Xarelto for atrial fibrillation at home. She has not noted frank blood in stool, denies nausea, vomiting. Was discharged with a hemoglobin of 9.2 and presents to the ED with a hemoglobin of 5.4, elevated BUN/creatinine ratio of 26/1.33 suspicious for upper GI.   Past Medical History:  Diagnosis Date  . Arthritis   . Asthma    ??  . Atopic dermatitis   . Cardiac arrest ([place]) 10/2014   [name]  . Cardiomyopathy, ischemic    a. EF previously low, improved to LVEF 50-55% as of May 2018  . Chronic combined systolic and diastolic heart failure ([place])   . CKD (chronic kidney disease), stage III   . COPD (chronic obstructive pulmonary disease) ([place])   . Esophageal dilatation 2013  . GERD (gastroesophageal reflux disease)   . Gout   . Headache   . Hypertension   . Hypokalemia   . Idiopathic angioedema   . LGI bleed 08/06/2017   a. felt to be hemorrhoidal during that admission (no drop in Hgb).  . Lower back pain   . Persistent atrial fibrillation (HCC)    a. h/o difficult to control rates (complicated by noncompliance), not felt to be a candidate for ablation or antiarrhythmic due to noncompliance.  . Personal history of noncompliance with medical treatment, presenting hazards to health   . Prediabetes   . S/P CABG x 2 with clipping of LA  appendage 11/14/2014   LIMA to LAD, SVG to OM, EVH via right thigh  . Urine incontinence   . Uterine fibroid     Past Surgical History:  Procedure Laterality Date  . BALLOON DILATION N/A 10/10/2019   Procedure: BALLOON DILATION;  Surgeon: Otis Brace, MD;  Location: [facility];  Service: Gastroenterology;  Laterality: N/A;  . BIOPSY  10/10/2019   Procedure: BIOPSY;  Surgeon: Otis Brace, MD;  Location: [place];  Service: Gastroenterology;;  . BIOPSY  10/11/2019   Procedure: BIOPSY;  Surgeon: Otis Brace, MD;  Location: [place];  Service: Gastroenterology;;  . CARDIOVERSION N/A 11/18/2014   Procedure: CARDIOVERSION;  Surgeon: [name], MD;  Location: [place];  Service: Cardiovascular;  Laterality: N/A;  . CARDIOVERSION N/A 12/25/2017   Procedure: CARDIOVERSION;  Surgeon: [name], MD;  Location: [place];  Service: Cardiovascular;  Laterality: N/A;  . CLIPPING OF ATRIAL APPENDAGE N/A 11/14/2014   Procedure: CLIPPING OF ATRIAL APPENDAGE;  Surgeon: [name], MD;  Location: [place];  Service: Open Heart Surgery;  Laterality: N/A;  . COLONOSCOPY  2013  . COLONOSCOPY WITH PROPOFOL N/A 10/11/2019   Procedure: COLONOSCOPY WITH PROPOFOL;  Surgeon: Otis Brace, MD;  Location: [place];  Service: Gastroenterology;  Laterality: N/A;  . CORONARY ARTERY BYPASS GRAFT N/A 11/14/2014   Procedure: CORONARY ARTERY BYPASS GRAFTING (CABG)TIMES 2 USING LEFT INTERNAL MAMMARY ARTERY AND RIGHT SAPHENOUS VEIN HARVESTED ENDOSCOPICALLY;  Surgeon: [name], MD;  Location: [place];  Service:  Open Heart Surgery;  Laterality: N/A;  . ESOPHAGOGASTRODUODENOSCOPY (EGD) WITH PROPOFOL N/A 10/10/2019   Procedure: ESOPHAGOGASTRODUODENOSCOPY (EGD) WITH PROPOFOL;  Surgeon: Otis Brace, MD;  Location: [facility];  Service: Gastroenterology;  Laterality: N/A;  . LEFT HEART CATHETERIZATION WITH CORONARY ANGIOGRAM N/A 11/04/2014   Procedure: LEFT HEART CATHETERIZATION  WITH CORONARY ANGIOGRAM;  Surgeon: Troy Sine, MD;  Location: [facility] CATH LAB;  Service: Cardiovascular;  Laterality: N/A;  . POLYPECTOMY  10/11/2019   Procedure: POLYPECTOMY;  Surgeon: Otis Brace, MD;  Location: [place] [facility];  Service: Gastroenterology;;  . TEE WITHOUT CARDIOVERSION N/A 11/14/2014   Procedure: TRANSESOPHAGEAL ECHOCARDIOGRAM (TEE);  Surgeon: [name], MD;  Location: [place];  Service: Open Heart Surgery;  Laterality: N/A;  . TEE WITHOUT CARDIOVERSION N/A 12/25/2017   Procedure: TRANSESOPHAGEAL ECHOCARDIOGRAM (TEE);  Surgeon: [name], MD;  Location: [facility] ENDOSCOPY;  Service: Cardiovascular;  Laterality: N/A;  . TEMPORARY PACEMAKER INSERTION  11/04/2014   Procedure: TEMPORARY PACEMAKER INSERTION;  Surgeon: Troy Sine, MD;  Location: [facility] CATH LAB;  Service: Cardiovascular;;  . TOOTH EXTRACTION      Prior to Admission medications   Medication Sig Start Date End Date Taking? Authorizing Provider  acetaminophen (TYLENOL) 500 MG tablet Take 500 mg by mouth every 6 (six) hours as needed for moderate pain.    Yes [provider]  allopurinol (ZYLOPRIM) 300 MG tablet Take 1 tablet (300 mg total) by mouth daily. 01/04/18  Yes Georgiana Shore, NP  atorvastatin (LIPITOR) 10 MG tablet Take 10 mg by mouth daily.   Yes [provider]  furosemide (LASIX) 20 MG tablet Take 1 tablet (20 mg total) by mouth daily. 10/11/19 12/10/19 Yes Shelly Coss, MD  losartan (COZAAR) 25 MG tablet Take 1 tablet (25 mg total) by mouth 2 (two) times daily. 07/17/19  Yes Bensimhon, Shaune Pascal, MD  metoprolol succinate (TOPROL XL) 50 MG 24 hr tablet Take 1 tablet (50 mg total) by mouth daily. Take with or immediately following a meal. 10/11/19 10/10/20 Yes Adhikari, Amrit, MD  pantoprazole (PROTONIX) 40 MG tablet Take 40 mg by mouth daily.   Yes [provider]  rivaroxaban (XARELTO) 20 MG TABS tablet Take 1 tablet (20 mg total) by mouth daily with supper. Resume tomorrow  10/11/19  Yes Shelly Coss, MD  spironolactone (ALDACTONE) 25 MG tablet Take 1 tablet (25 mg total) by mouth daily. 07/17/19 10/26/19 Yes Bensimhon, Shaune Pascal, MD    Current Facility-Administered Medications  Medication Dose Route Frequency Provider Last Rate Last Admin  . acetaminophen (TYLENOL) tablet 650 mg  650 mg Oral Q6H PRN Karmen Bongo, MD       Or  . acetaminophen (TYLENOL) suppository 650 mg  650 mg Rectal Q6H PRN Karmen Bongo, MD      . Derrill Memo ON 10/27/2019] allopurinol (ZYLOPRIM) tablet 300 mg  300 mg Oral Daily Karmen Bongo, MD      . Derrill Memo ON 10/27/2019] atorvastatin (LIPITOR) tablet 10 mg  10 mg Oral Daily Karmen Bongo, MD      . lactated ringers infusion   Intravenous Continuous Karmen Bongo, MD   Stopped at 10/26/19 1503  . losartan (COZAAR) tablet 25 mg  25 mg Oral BID Karmen Bongo, MD      . metoprolol succinate (TOPROL-XL) 24 hr tablet 50 mg  50 mg Oral Daily Karmen Bongo, MD   Stopped at 10/26/19 1502  . ondansetron (ZOFRAN) tablet 4 mg  4 mg Oral Q6H PRN Karmen Bongo, MD  Or  . ondansetron (ZOFRAN) injection 4 mg  4 mg Intravenous Q6H PRN Karmen Bongo, MD      . pantoprazole (PROTONIX) 80 mg in sodium chloride 0.9 % 100 mL (0.8 mg/mL) infusion  8 mg/hr Intravenous Continuous Ronnette Juniper, MD      . pantoprazole (PROTONIX) 80 mg in sodium chloride 0.9 % 100 mL IVPB  80 mg Intravenous Once Ronnette Juniper, MD      . Derrill Memo ON 10/30/2019] pantoprazole (PROTONIX) injection 40 mg  40 mg Intravenous Q12H Ronnette Juniper, MD      . sodium chloride flush (NS) 0.9 % injection 3 mL  3 mL Intravenous Q12H Karmen Bongo, MD        Allergies as of 10/26/2019 - Review Complete 10/26/2019  Allergen Reaction Noted  . Bee venom Anaphylaxis 10/29/2014  . Codeine Nausea And Vomiting 02/25/2015  . Entresto [sacubitril-valsartan]  08/27/2019  . Shrimp [shellfish allergy] Swelling 10/29/2014    Family History  Problem Relation Age of Onset  . Cancer Mother         LYMPHOMA  . Heart disease Father   . Other Father        TB  . CVA Sister   . Prostate cancer Brother 7  . Diabetes Brother     Social History   Socioeconomic History  . Marital status: Widowed    Spouse name: Not on file  . Number of children: 1  . Years of education: 31  . Highest education level: Not on file  Occupational History  . Occupation: RETIRED [employer]  Tobacco Use  . Smoking status: Former Smoker    Packs/day: 0.10    Years: 56.00    Pack years: 5.60    Types: Cigarettes    Quit date: 2019    Years since quitting: 2.1  . Smokeless tobacco: Never Used  Substance and Sexual Activity  . Alcohol use: No    Alcohol/week: 0.0 standard drinks  . Drug use: No  . Sexual activity: Not on file  Other Topics Concern  . Not on file  Social History Narrative   Patient reports it being difficult to pay for everything due to outstanding medical bills from hospital stay last year but states she is able to keep up with basic expenses.  Patient does have some concerns with her house's condition due to a water leak- had her roof repaired last month but has some leaking in the front which has affected her porch.  Patient owns her own car and is able to drive herself but has some concerns about the reliability of her car- has gotten taxi to the clinic for appointment in the past when her car broke down.   Social Determinants of Health   Financial Resource Strain:   . Difficulty of Paying Living Expenses:   Food Insecurity:   . Worried About Charity fundraiser in the Last Year:   . Arboriculturist in the Last Year:   Transportation Needs:   . Film/video editor (Medical):   Marland Kitchen Lack of Transportation (Non-Medical):   Physical Activity:   . Days of Exercise per Week:   . Minutes of Exercise per Session:   Stress:   . Feeling of Stress :   Social Connections:   . Frequency of Communication with Friends and Family:   . Frequency of Social Gatherings with  Friends and Family:   . Attends Religious Services:   . Active Member of Clubs or  Organizations:   . Attends Archivist Meetings:   Marland Kitchen Marital Status:   Intimate Partner Violence:   . Fear of Current or Ex-Partner:   . Emotionally Abused:   Marland Kitchen Physically Abused:   . Sexually Abused:     Review of Systems: Positive for: GI: Described in detail in HPI.    Gen:  fatigue, weakness, malaise,Denies any fever, chills, rigors, night sweats, anorexia, involuntary weight loss, and sleep disorder CV: Denies chest pain, angina, palpitations, syncope, orthopnea, PND, peripheral edema, and claudication. Resp: Denies dyspnea, cough, sputum, wheezing, coughing up blood. GU : Denies urinary burning, blood in urine, urinary frequency, urinary hesitancy, nocturnal urination, and urinary incontinence. MS: Denies joint pain or swelling.  Denies muscle weakness, cramps, atrophy.  Derm: Denies rash, itching, oral ulcerations, hives, unhealing ulcers.  Psych: Denies depression, anxiety, memory loss, suicidal ideation, hallucinations,  and confusion. Heme: Denies bruising and enlarged lymph nodes. Neuro:  Denies any headaches, dizziness, paresthesias. Endo:  Denies any problems with DM, thyroid, adrenal function.  Physical Exam: Vital signs in last 24 hours: Temp:  [98.3 F (36.8 C)-99 F (37.2 C)] 98.4 F (36.9 C) (03/13 1644) Pulse Rate:  [74-84] 74 (03/13 1644) Resp:  [16-24] 19 (03/13 1644) BP: (119-165)/(48-89) 135/48 (03/13 1644) SpO2:  [100 %] 100 % (03/13 1644) Weight:  [101.2 kg] 101.2 kg (03/13 1644)    General:   Alert,  Well-developed, obese, pleasant and cooperative in NAD Head:  Normocephalic and atraumatic. Eyes:  Sclera clear, no icterus.  Prominent pallor Ears:  Normal auditory acuity. Nose:  No deformity, discharge,  or lesions. Mouth:  No deformity or lesions.  Oropharynx pink & moist. Neck:  Supple; no masses or thyromegaly. Lungs:  Clear throughout to auscultation.    No wheezes, crackles, or rhonchi. No acute distress. Heart:  Regular rate and rhythm; no murmurs, clicks, rubs,  or gallops. Extremities:  Without clubbing or edema. Neurologic:  Alert and  oriented x4;  grossly normal neurologically. Skin:  Intact without significant lesions or rashes. Psych:  Alert and cooperative. Normal mood and affect. Abdomen:  Soft, nontender and nondistended. No masses, hepatosplenomegaly or hernias noted. Normal bowel sounds, without guarding, and without rebound.         Lab Results: Recent Labs    10/26/19 1112  WBC 9.4  HGB 5.4*  HCT 18.8*  PLT 205   BMET Recent Labs    10/26/19 1112  NA 140  K 4.3  CL 111  CO2 15*  GLUCOSE 137*  BUN 26*  CREATININE 1.33*  CALCIUM 8.6*   LFT Recent Labs    10/26/19 1112  PROT 6.0*  ALBUMIN 3.2*  AST 13*  ALT 9  ALKPHOS 108  BILITOT 0.4   PT/INR No results for input(s): LABPROT, INR in the last 72 hours.  Studies/Results: No results found.  Impression: Symptomatic anemia, black stools, elevated BUN/creatinine ratio suspicious for upper GI bleeding History of atrial fibrillation, last dose of Xarelto yesterday  Plan: Clear liquid diet today. IV Protonix drip. 2 Units PRBC transfusion. Monitor hemodynamic and H&H, confused to keep hemoglobin more than 7 N.p.o. post midnight for EGD, EGD is unremarkable will deploy PillCam. The risks and the benefits of the procedure were discussed with the patient in detail. She understands and verbalizes consent.    LOS: 0 days   Ronnette Juniper, MD  10/26/2019, 5:24 PM

## 2019-10-26 NOTE — ED Triage Notes (Signed)
Pt reports black tarry stools and generalized weakness x 3-4 days.  States she was discharged from hospital 2/26 for GI bleed with unknown cause.  Denies pain.

## 2019-10-27 ENCOUNTER — Encounter (HOSPITAL_COMMUNITY): Payer: Self-pay | Admitting: Internal Medicine

## 2019-10-27 ENCOUNTER — Inpatient Hospital Stay (HOSPITAL_COMMUNITY): Payer: HMO | Admitting: Certified Registered Nurse Anesthetist

## 2019-10-27 ENCOUNTER — Encounter (HOSPITAL_COMMUNITY)
Admission: EM | Disposition: A | Discharge status: Home / Self Care | Payer: Self-pay | Source: Home / Self Care | Attending: Internal Medicine

## 2019-10-27 DIAGNOSIS — I480 Paroxysmal atrial fibrillation: Secondary | ICD-10-CM

## 2019-10-27 DIAGNOSIS — N183 Chronic kidney disease, stage 3 unspecified: Secondary | ICD-10-CM

## 2019-10-27 DIAGNOSIS — I10 Essential (primary) hypertension: Secondary | ICD-10-CM

## 2019-10-27 DIAGNOSIS — I5042 Chronic combined systolic (congestive) and diastolic (congestive) heart failure: Secondary | ICD-10-CM

## 2019-10-27 HISTORY — PX: ESOPHAGOGASTRODUODENOSCOPY (EGD) WITH PROPOFOL: SHX5813

## 2019-10-27 HISTORY — PX: GIVENS CAPSULE STUDY: SHX5432

## 2019-10-27 LAB — BPAM RBC
Blood Product Expiration Date: 202103192359
Blood Product Expiration Date: 202103192359
ISSUE DATE / TIME: 202103131257
ISSUE DATE / TIME: 202103131710
Unit Type and Rh: 6200
Unit Type and Rh: 6200

## 2019-10-27 LAB — BASIC METABOLIC PANEL
Anion gap: 10 (ref 5–15)
BUN: 19 mg/dL (ref 8–23)
CO2: 18 mmol/L — ABNORMAL LOW (ref 22–32)
Calcium: 8.2 mg/dL — ABNORMAL LOW (ref 8.9–10.3)
Chloride: 112 mmol/L — ABNORMAL HIGH (ref 98–111)
Creatinine, Ser: 1.17 mg/dL — ABNORMAL HIGH (ref 0.44–1.00)
GFR calc Af Amer: 53 mL/min — ABNORMAL LOW (ref >60)
GFR calc non Af Amer: 46 mL/min — ABNORMAL LOW (ref >60)
Glucose, Bld: 118 mg/dL — ABNORMAL HIGH (ref 70–99)
Potassium: 3.7 mmol/L (ref 3.5–5.1)
Sodium: 140 mmol/L (ref 135–145)

## 2019-10-27 LAB — TYPE AND SCREEN
ABO/RH(D): A POS
Antibody Screen: NEGATIVE
Unit division: 0
Unit division: 0

## 2019-10-27 LAB — CBC
HCT: 23.3 % — ABNORMAL LOW (ref 36.0–46.0)
Hemoglobin: 7.3 g/dL — ABNORMAL LOW (ref 12.0–15.0)
MCH: 26.8 pg (ref 26.0–34.0)
MCHC: 31.3 g/dL (ref 30.0–36.0)
MCV: 85.7 fL (ref 80.0–100.0)
Platelets: 181 10*3/uL (ref 150–400)
RBC: 2.72 MIL/uL — ABNORMAL LOW (ref 3.87–5.11)
RDW: 17 % — ABNORMAL HIGH (ref 11.5–15.5)
WBC: 7.7 10*3/uL (ref 4.0–10.5)
nRBC: 0 % (ref 0.0–0.2)

## 2019-10-27 SURGERY — ESOPHAGOGASTRODUODENOSCOPY (EGD) WITH PROPOFOL
Anesthesia: Monitor Anesthesia Care

## 2019-10-27 MED ORDER — PHENYLEPHRINE HCL (PRESSORS) 10 MG/ML IV SOLN
INTRAVENOUS | Status: DC | PRN
  Administered 2019-10-27: 80 ug via INTRAVENOUS

## 2019-10-27 MED ORDER — PROPOFOL 500 MG/50ML IV EMUL
INTRAVENOUS | Status: DC | PRN
  Administered 2019-10-27: 125 ug/kg/min via INTRAVENOUS

## 2019-10-27 MED ORDER — SODIUM CHLORIDE 0.9 % IV SOLN: INTRAVENOUS | Status: DC | PRN

## 2019-10-27 MED ORDER — PANTOPRAZOLE SODIUM 40 MG IV SOLR
40.0000 mg | Freq: Two times a day (BID) | INTRAVENOUS | Status: DC
  Administered 2019-10-27 – 2019-10-29 (×5): 40 mg via INTRAVENOUS
  Filled 2019-10-27 (×5): qty 40

## 2019-10-27 MED ORDER — SODIUM CHLORIDE 0.9 % IV SOLN: INTRAVENOUS | Status: DC

## 2019-10-27 SURGICAL SUPPLY — 15 items

## 2019-10-27 NOTE — Op Note (Signed)
[facility] Patient Name: [name] [name] Procedure Date : 10/27/2019 MRN: [id number] Attending MD: Ronnette Juniper , MD Date of Birth: 09/08/1945 CSN: [id number] Age: 74 Admit Type: Inpatient Procedure:                Upper GI endoscopy Indications:              Acute post hemorrhagic anemia, Melena Providers:                Ronnette Juniper, MD, Grace Isaac, RN, Laverda Sorenson,                            Technician, Corie Chiquito, Technician, Gala Lewandowsky, CRNA Referring MD:             [facility] Medicines:                Monitored Anesthesia Care Complications:            No immediate complications. Estimated Blood Loss:     Estimated blood loss: none. Procedure:                Pre-Anesthesia Assessment:                           - Prior to the procedure, a History and Physical                            was performed, and patient medications and                            allergies were reviewed. [name] tolerance of                            previous anesthesia was also reviewed. The risks                            and benefits of the procedure and the sedation                            options and risks were discussed with the patient.                            All questions were answered, and informed consent                            was obtained. Prior Anticoagulants: The patient has                            taken Xarelto (rivaroxaban), last dose was 2 days                            prior to procedure. ASA Grade Assessment: III - A  patient with severe systemic disease. After                            reviewing the risks and benefits, the patient was                            deemed in satisfactory condition to undergo the                            procedure.                           After obtaining informed consent, the endoscope was                            passed under direct vision.  Throughout the                            procedure, the patient's blood pressure, pulse, and                            oxygen saturations were monitored continuously. The                            [id number]-[id number]) Olympus gastroscope was                            introduced through the mouth, and advanced to the                            second part of duodenum. The upper GI endoscopy was                            accomplished without difficulty. The patient                            tolerated the procedure well. Scope In: Scope Out: Findings:      A widely patent Schatzki ring was found at the gastroesophageal junction.      A 6 cm hiatal hernia was present.      The gastric body, greater curvature of the stomach, lesser curvature of       the stomach, incisura and gastric antrum were normal.      Mild nodularity in the prepyloric area, no evidence of active or recent       bleeding.      The cardia and gastric fundus otherwise were normal on retroflexion.      Localized nodular mucosa was found in the duodenal bulb.      The first portion of the duodenum and second portion of the duodenum       were normal.      Using the endoscope, the video capsule enteroscope was advanced into the       first portion of the duodenum. Impression:               - Widely patent Schatzki ring.                           -  6 cm hiatal hernia.                           - Normal gastric body, greater curvature of the                            stomach, lesser curvature of the stomach, incisura                            and antrum.                           - Nodular mucosa in the duodenal bulb.                           - Normal first portion of the duodenum and second                            portion of the duodenum.                           - Successful completion of the Video Capsule                            Enteroscope placement.                           - No specimens  collected. Moderate Sedation:      Patient did not receive moderate sedation for this procedure, but       instead received monitored anesthesia care. Recommendation:           - NPO for now, follow instructions as per small                            bowel pillcam deployment for advacement of diet.. Procedure Code(s):        --- Professional ---                           [phone], Esophagogastroduodenoscopy, flexible,                            transoral; diagnostic, including collection of                            specimen(s) by brushing or washing, when performed                            (separate procedure) Diagnosis Code(s):        --- Professional ---                           K22.2, Esophageal obstruction                           K44.9, Diaphragmatic hernia without obstruction or  gangrene                           K31.89, Other diseases of stomach and duodenum                           D62, Acute posthemorrhagic anemia                           K92.1, Melena (includes Hematochezia) CPT copyright 2019 American Medical Association. All rights reserved. The codes documented in this report are preliminary and upon coder review may  be revised to meet current compliance requirements. Ronnette Juniper, MD 10/27/2019 8:01:49 AM This report has been signed electronically. Number of Addenda: 0

## 2019-10-27 NOTE — Interval H&P Note (Signed)
History and Physical Interval Note: 73/female with anemia, melena, was on xarelto, last dose on 10/25/19 for an EGD with possible small bowel pillcam deployment.  10/27/2019 7:33 AM  [name]  has presented today for EGD with possible small bowel pillcam deployment, with the diagnosis of melena.  The various methods of treatment have been discussed with the patient and family. After consideration of risks, benefits and other options for treatment, the patient has consented to  Procedure(s) with comments: ESOPHAGOGASTRODUODENOSCOPY (EGD) WITH PROPOFOL (N/A) GIVENS CAPSULE STUDY (N/A) - if EGD is negative as a surgical intervention.  [name] history has been reviewed, patient examined, no change in status, stable for surgery.  I have reviewed the patient's chart and labs.  Questions were answered to the patient's satisfaction.     Ronnette Juniper

## 2019-10-27 NOTE — Anesthesia Postprocedure Evaluation (Signed)
Anesthesia Post Note  Patient: DEYSHA CARTIER  Procedure(s) Performed: ESOPHAGOGASTRODUODENOSCOPY (EGD) WITH PROPOFOL (N/A ) GIVENS CAPSULE STUDY (N/A )     Patient location during evaluation: Endoscopy Anesthesia Type: MAC Level of consciousness: awake and alert Pain management: pain level controlled Vital Signs Assessment: post-procedure vital signs reviewed and stable Respiratory status: spontaneous breathing, nonlabored ventilation, respiratory function stable and patient connected to nasal cannula oxygen Cardiovascular status: stable and blood pressure returned to baseline Postop Assessment: no apparent nausea or vomiting Anesthetic complications: no    Last Vitals:  Vitals:   10/27/19 0820 10/27/19 0849  BP: (!) 134/44 122/62  Pulse: 67 (!) 56  Resp: 18 (!) 21  Temp:  37.3 C  SpO2: 96% 99%    Last Pain:  Vitals:   10/27/19 0900  TempSrc:   PainSc: 0-No pain                 Laron Boorman

## 2019-10-27 NOTE — Anesthesia Procedure Notes (Signed)
Procedure Name: MAC Date/Time: 10/27/2019 7:42 AM Performed by: Oletta Lamas, CRNA Pre-anesthesia Checklist: Patient identified, Suction available, Emergency Drugs available, Patient being monitored and Timeout performed Patient Re-evaluated:Patient Re-evaluated prior to induction Oxygen Delivery Method: Nasal cannula

## 2019-10-27 NOTE — Transfer of Care (Signed)
Immediate Anesthesia Transfer of Care Note  Patient: [name] [name]  Procedure(s) Performed: ESOPHAGOGASTRODUODENOSCOPY (EGD) WITH PROPOFOL (N/A ) GIVENS CAPSULE STUDY (N/A )  Patient Location: Endoscopy Unit  Anesthesia Type:MAC  Level of Consciousness: drowsy  Airway & Oxygen Therapy: Patient Spontanous Breathing and Patient connected to nasal cannula oxygen  Post-op Assessment: Report given to RN and Post -op Vital signs reviewed and stable  Post vital signs: Reviewed and stable  Last Vitals:  Vitals Value Taken Time  BP 110/33 10/27/19 0803  Temp 36.9 C 10/27/19 0803  Pulse 64 10/27/19 0805  Resp 21 10/27/19 0805  SpO2 100 % 10/27/19 0805  Vitals shown include unvalidated device data.  Last Pain:  Vitals:   10/27/19 0803  TempSrc: Oral  PainSc:       Patients Stated Pain Goal: 0 (96/43/83 8184)  Complications: No apparent anesthesia complications

## 2019-10-27 NOTE — Progress Notes (Signed)
Patient ID: [name] [name], female   DOB: Apr 20, 1946, 74 y.o.   MRN: [id number]  PROGRESS NOTE    [name] [name]  [id number] DOB: 1945-11-03 DOA: 10/26/2019 PCP: [name], MD   Brief Narrative:  74 year old female with history of CAD status post CABG, A. fib on Xarelto, hypertension, COPD, stage III CKD and chronic combined CHF, recent hospitalization from 10/08/2019-10/11/2019 for upper GI bleeding with subsequent EGD and colonoscopy performed which did not show any active bleeding source presented with weakness and recurrent tarry stools.  Hemoglobin was 5.4.  She was started on IV Protonix.  GI was consulted.  Assessment & Plan:   Probable upper GI bleeding -Patient had a recent hospitalization for the same: EGD and colonoscopy did not reveal a bleeding source -Hemoglobin 5.4 on presentation.  Status post 2 units packed red cells transfusion. -Hemoglobin 7.3 today. -GI following: Status post EGD and PillCam deployment today by GI.  No obvious bleeding source in upper GI endoscopy. -Xarelto on hold -Monitor H&H.  Decrease IV fluids to 50 cc an hour.  Paroxysmal A. Fib -Currently rate controlled.  Xarelto on hold.  Continue metoprolol  Chronic combined CHF CAD -Currently euvolemic. -Strict input output.  Daily weights.  Outpatient follow-up with cardiology -Continue metoprolol/statin  Hypertension -Monitor blood pressure.  Continue losartan and metoprolol.  COPD -Stable.  Chronic kidney disease stage III -Creatinine 1.17.  Stable.  Baseline creatinine around 1.4-1.5    DVT prophylaxis: SCDs Code Status: Full Family Communication: Spoke to patient at bedside Disposition Plan: Home in 1 to 2 days once hemoglobin stabilizes and once cleared by GI  Consultants: GI  Procedures:  EGD with PillCam deployment on 10/27/2019  Impression:               - Widely patent Schatzki ring.                           - 6 cm hiatal hernia.                           - Normal gastric  body, greater curvature of the                            stomach, lesser curvature of the stomach, incisura                            and antrum.                           - Nodular mucosa in the duodenal bulb.                           - Normal first portion of the duodenum and second                            portion of the duodenum.                           - Successful completion of the Video Capsule                            Enteroscope placement.                           -  No specimens collected.  Antimicrobials: None   Subjective: Patient seen and examined at bedside.  She just came back from endoscopy and is a little sleepy.  Wakes up slightly and answers some questions.  Denies any worsening shortness of breath.  No overnight fever, vomiting or abdominal pain.  Objective: Vitals:   10/26/19 1900 10/26/19 2122 10/27/19 0523 10/27/19 0714  BP: (!) 109/58 (!) 94/54 (!) 130/41 (!) 163/62  Pulse: 78 75 65 73  Resp: 19 17 19  (!) 23  Temp: (!) 97.4 F (36.3 C) 98.4 F (36.9 C) 98.3 F (36.8 C) 98.4 F (36.9 C)  TempSrc: Oral   Oral  SpO2: 99% 99% 100% 100%  Weight:  101.2 kg    Height:        Intake/Output Summary (Last 24 hours) at 10/27/2019 0755 Last data filed at 10/27/2019 0523 Gross per 24 hour  Intake 1279.7 ml  Output 0 ml  Net 1279.7 ml   Filed Weights   10/26/19 1644 10/26/19 2122  Weight: 101.2 kg 101.2 kg    Examination:  General exam: Appears sleepy, answers some questions.  Poor historian.  No distress.   Respiratory system: Bilateral decreased breath sounds at bases Cardiovascular system: S1 & S2 heard, Rate controlled Gastrointestinal system: Abdomen is nondistended, soft and nontender. Normal bowel sounds heard. Extremities: No cyanosis, clubbing, edema  Central nervous system: Alert and oriented. No focal neurological deficits. Moving extremities Skin: No rashes, lesions or ulcers Psychiatry: Judgement and insight appear normal. Mood &  affect appropriate.     Data Reviewed: I have personally reviewed following labs and imaging studies  CBC: Recent Labs  Lab 10/26/19 1112 10/26/19 2056 10/27/19 0516  WBC 9.4 8.8 7.7  HGB 5.4* 7.5* 7.3*  HCT 18.8* 23.6* 23.3*  MCV 90.0 83.1 85.7  PLT 205 182 0000000   Basic Metabolic Panel: Recent Labs  Lab 10/26/19 1112 10/27/19 0516  NA 140 140  K 4.3 3.7  CL 111 112*  CO2 15* 18*  GLUCOSE 137* 118*  BUN 26* 19  CREATININE 1.33* 1.17*  CALCIUM 8.6* 8.2*   GFR: Estimated Creatinine Clearance: 51.5 mL/min (A) (by C-G formula based on SCr of 1.17 mg/dL (H)). Liver Function Tests: Recent Labs  Lab 10/26/19 1112  AST 13*  ALT 9  ALKPHOS 108  BILITOT 0.4  PROT 6.0*  ALBUMIN 3.2*   No results for input(s): LIPASE, AMYLASE in the last 168 hours. No results for input(s): AMMONIA in the last 168 hours. Coagulation Profile: Recent Labs  Lab 10/26/19 2056  INR 1.5*   Cardiac Enzymes: No results for input(s): CKTOTAL, CKMB, CKMBINDEX, TROPONINI in the last 168 hours. BNP (last 3 results) No results for input(s): PROBNP in the last 8760 hours. HbA1C: No results for input(s): HGBA1C in the last 72 hours. CBG: No results for input(s): GLUCAP in the last 168 hours. Lipid Profile: No results for input(s): CHOL, HDL, LDLCALC, TRIG, CHOLHDL, LDLDIRECT in the last 72 hours. Thyroid Function Tests: No results for input(s): TSH, T4TOTAL, FREET4, T3FREE, THYROIDAB in the last 72 hours. Anemia Panel: No results for input(s): VITAMINB12, FOLATE, FERRITIN, TIBC, IRON, RETICCTPCT in the last 72 hours. Sepsis Labs: No results for input(s): PROCALCITON, LATICACIDVEN in the last 168 hours.  Recent Results (from the past 240 hour(s))  SARS CORONAVIRUS 2 (TAT 6-24 HRS) Nasopharyngeal Nasopharyngeal Swab     Status: None   Collection Time: 10/26/19  1:00 PM   Specimen: Nasopharyngeal Swab  Result Value Ref Range Status  SARS Coronavirus 2 NEGATIVE NEGATIVE Final    Comment:  (NOTE) SARS-CoV-2 target nucleic acids are NOT DETECTED. The SARS-CoV-2 RNA is generally detectable in upper and lower respiratory specimens during the acute phase of infection. Negative results do not preclude SARS-CoV-2 infection, do not rule out co-infections with other pathogens, and should not be used as the sole basis for treatment or other patient management decisions. Negative results must be combined with clinical observations, patient history, and epidemiological information. The expected result is Negative. Fact Sheet for Patients: SugarRoll.be Fact Sheet for Healthcare Providers: https://www.woods-mathews.com/ This test is not yet approved or cleared by the [place] FDA and  has been authorized for detection and/or diagnosis of SARS-CoV-2 by FDA under an Emergency Use Authorization (EUA). This EUA will remain  in effect (meaning this test can be used) for the duration of the COVID-19 declaration under Section 56 4(b)(1) of the Act, 21 U.S.C. section 360bbb-3(b)(1), unless the authorization is terminated or revoked sooner. Performed at [facility], [place] [address], [place], [place] [address]          Radiology Studies: No results found.      Scheduled Meds: . [MAR Hold] allopurinol  300 mg Oral Daily  . [MAR Hold] atorvastatin  10 mg Oral Daily  . [MAR Hold] losartan  25 mg Oral BID  . [MAR Hold] metoprolol succinate  50 mg Oral Daily  . [MAR Hold] pantoprazole  40 mg Intravenous Q12H  . [MAR Hold] sodium chloride flush  3 mL Intravenous Q12H   Continuous Infusions: . sodium chloride    . lactated ringers 75 mL/hr at 10/27/19 0024  . pantoprozole (PROTONIX) infusion 8 mg/hr (10/26/19 2200)          Aline August, MD [facility] 10/27/2019, 7:55 AM

## 2019-10-27 NOTE — Brief Op Note (Signed)
10/26/2019 - 10/27/2019  8:02 AM  PATIENT:  [name] [name]  74 y.o. female  PRE-OPERATIVE DIAGNOSIS:  melena  POST-OPERATIVE DIAGNOSIS:  no bleeding, hiatal hernia. Schatzkis ring. pill cam deployed 1st portion small duodenum  PROCEDURE:  Procedure(s) with comments: ESOPHAGOGASTRODUODENOSCOPY (EGD) WITH PROPOFOL (N/A) GIVENS CAPSULE STUDY (N/A) - if EGD is negative  SURGEON:  Surgeon(s) and Role:    Ronnette Juniper, MD - Primary  PHYSICIAN ASSISTANT:   ASSISTANTS: Grace Isaac, RN, Laverda Sorenson, Tech, Carlyle Basques, Tech   ANESTHESIA:   MAC  EBL:  None  BLOOD ADMINISTERED:none  DRAINS: none   LOCAL MEDICATIONS USED:  NONE  SPECIMEN:  No Specimen  DISPOSITION OF SPECIMEN:  N/A  COUNTS:  YES  TOURNIQUET:  * No tourniquets in log *  DICTATION: .Dragon Dictation  PLAN OF CARE: Admit to inpatient   PATIENT DISPOSITION:  PACU - hemodynamically stable.   Delay start of Pharmacological VTE agent (>24hrs) due to surgical blood loss or risk of bleeding: yes

## 2019-10-27 NOTE — Anesthesia Preprocedure Evaluation (Addendum)
Anesthesia Evaluation  Patient identified by MRN, date of birth, ID band Patient awake    Reviewed: Allergy & Precautions, NPO status , Patient's Chart, lab work & pertinent test results, reviewed documented beta blocker date and time   History of Anesthesia Complications Negative for: history of anesthetic complications  Airway Mallampati: II  TM Distance: >3 FB Neck ROM: Full    Dental  (+) Dental Advisory Given, Edentulous Upper, Edentulous Lower   Pulmonary shortness of breath and at rest, asthma , neg sleep apnea, COPD, neg recent URI, former smoker,     + decreased breath sounds      Cardiovascular hypertension, Pt. on medications and Pt. on home beta blockers + CAD, + Past MI, + CABG, +CHF and + DOE  + dysrhythmias Atrial Fibrillation  Rhythm:Regular  2016 CABG x 2 with LA appendage clipping   09/2019 tte:  Left ventricular ejection fraction, by estimation, is 60 to 65%. The  left ventricle has normal function. The left ventricle has no regional  wall motion abnormalities. Left ventricular diastolic parameters are  indeterminate. Elevated left ventricular  end-diastolic pressure.  2. Right ventricular systolic function is normal. The right ventricular  size is normal. Tricuspid regurgitation signal is inadequate for assessing  PA pressure.  3. The mitral valve is normal in structure and function. No evidence of  mitral valve regurgitation. No evidence of mitral stenosis.  4. The aortic valve has an indeterminant number of cusps. Aortic valve  regurgitation is not visualized. No aortic stenosis is present.  5. The inferior vena cava is normal in size with greater than 50%  respiratory variability, suggesting right atrial pressure of 3 mmHg.   2020 stress:   Nuclear stress EF: 44%.  There was no ST segment deviation noted during stress.  The study is normal.  This is a low risk study.  The left ventricular  ejection fraction is moderately decreased (30-44%).   Neuro/Psych  Headaches, neg Seizures negative psych ROS   GI/Hepatic GERD  ,? GI bleed    Endo/Other    Renal/GU CRFRenal disease     Musculoskeletal  (+) Arthritis ,   Abdominal   Peds  Hematology  (+) Blood dyscrasia, anemia , 2 unit pRBCs since admission, xarelto for afib   Anesthesia Other Findings   Reproductive/Obstetrics                            Anesthesia Physical Anesthesia Plan  ASA: III  Anesthesia Plan: MAC   Post-op Pain Management:    Induction: Intravenous  PONV Risk Score and Plan: 2 and Treatment may vary due to age or medical condition and Propofol infusion  Airway Management Planned: Nasal Cannula  Additional Equipment: None  Intra-op Plan:   Post-operative Plan:   Informed Consent: I have reviewed the patients History and Physical, chart, labs and discussed the procedure including the risks, benefits and alternatives for the proposed anesthesia with the patient or authorized representative who has indicated his/her understanding and acceptance.     Dental advisory given  Plan Discussed with: Surgeon  Anesthesia Plan Comments:         Anesthesia Quick Evaluation

## 2019-10-28 ENCOUNTER — Other Ambulatory Visit: Payer: Self-pay

## 2019-10-28 LAB — CBC WITH DIFFERENTIAL/PLATELET
Abs Immature Granulocytes: 0.03 10*3/uL (ref 0.00–0.07)
Basophils Absolute: 0 10*3/uL (ref 0.0–0.1)
Basophils Relative: 1 %
Eosinophils Absolute: 0.2 10*3/uL (ref 0.0–0.5)
Eosinophils Relative: 3 %
HCT: 23.4 % — ABNORMAL LOW (ref 36.0–46.0)
Hemoglobin: 7.1 g/dL — ABNORMAL LOW (ref 12.0–15.0)
Immature Granulocytes: 1 %
Lymphocytes Relative: 25 %
Lymphs Abs: 1.6 10*3/uL (ref 0.7–4.0)
MCH: 26.1 pg (ref 26.0–34.0)
MCHC: 30.3 g/dL (ref 30.0–36.0)
MCV: 86 fL (ref 80.0–100.0)
Monocytes Absolute: 0.4 10*3/uL (ref 0.1–1.0)
Monocytes Relative: 7 %
Neutro Abs: 4.1 10*3/uL (ref 1.7–7.7)
Neutrophils Relative %: 63 %
Platelets: 200 10*3/uL (ref 150–400)
RBC: 2.72 MIL/uL — ABNORMAL LOW (ref 3.87–5.11)
RDW: 17.2 % — ABNORMAL HIGH (ref 11.5–15.5)
WBC: 6.4 10*3/uL (ref 4.0–10.5)
nRBC: 0 % (ref 0.0–0.2)

## 2019-10-28 LAB — BASIC METABOLIC PANEL
Anion gap: 6 (ref 5–15)
BUN: 14 mg/dL (ref 8–23)
CO2: 21 mmol/L — ABNORMAL LOW (ref 22–32)
Calcium: 8.2 mg/dL — ABNORMAL LOW (ref 8.9–10.3)
Chloride: 115 mmol/L — ABNORMAL HIGH (ref 98–111)
Creatinine, Ser: 1.24 mg/dL — ABNORMAL HIGH (ref 0.44–1.00)
GFR calc Af Amer: 50 mL/min — ABNORMAL LOW (ref >60)
GFR calc non Af Amer: 43 mL/min — ABNORMAL LOW (ref >60)
Glucose, Bld: 118 mg/dL — ABNORMAL HIGH (ref 70–99)
Potassium: 3.7 mmol/L (ref 3.5–5.1)
Sodium: 142 mmol/L (ref 135–145)

## 2019-10-28 LAB — MAGNESIUM: Magnesium: 2 mg/dL (ref 1.7–2.4)

## 2019-10-28 LAB — HEMOGLOBIN AND HEMATOCRIT, BLOOD
HCT: 23.8 % — ABNORMAL LOW (ref 36.0–46.0)
Hemoglobin: 7.3 g/dL — ABNORMAL LOW (ref 12.0–15.0)

## 2019-10-28 MED ORDER — POLYETHYLENE GLYCOL 3350 17 G PO PACK
17.0000 g | PACK | Freq: Every day | ORAL | Status: DC
  Administered 2019-10-28 – 2019-10-29 (×2): 17 g via ORAL
  Filled 2019-10-28 (×2): qty 1

## 2019-10-28 MED ORDER — SENNOSIDES-DOCUSATE SODIUM 8.6-50 MG PO TABS
1.0000 | ORAL_TABLET | Freq: Two times a day (BID) | ORAL | Status: DC
  Administered 2019-10-28 – 2019-10-29 (×3): 1 via ORAL
  Filled 2019-10-28 (×3): qty 1

## 2019-10-28 NOTE — Patient Outreach (Signed)
  [facility]) Care Management Chronic Special Needs Program    10/28/2019  Name: [name], [name]: Dec 21, 1945  MRN: [id number]   [name] is enrolled in a chronic special needs plan for Heart Failure. RNCM received notification that client was hospitalized on 10/26/2019 with acute upper GI bleed. Per policy/procedure, care plan sent to [facility] utilization management.  Plan: RNCM will follow up with [facility] hospital liaison and inpatient care management team. RNCM will continue to follow and assist with discharge needs as indicated.  Thea Silversmith, RN, MSN, [place] [phone]

## 2019-10-28 NOTE — Progress Notes (Signed)
[facility] Gastroenterology Progress Note  [name] [name] 74 y.o. 04/07/1946   Subjective: Feels constipated. Capsule study showed a duodenal ulcer with bleeding and stigmata.  Objective: Vital signs: Vitals:   10/28/19 0431 10/28/19 0843  BP: (!) 147/96 (!) 128/49  Pulse: (!) 55 62  Resp: 17 20  Temp: 98.1 F (36.7 C) 98.3 F (36.8 C)  SpO2: 100% 100%    Physical Exam: Gen: lethargic, no acute distress, well-nourished HEENT: anicteric sclera CV: RRR Chest: CTA B Abd: soft, nontender, nondistended, +BS Ext: no edema  Lab Results: Recent Labs    10/27/19 0516 10/28/19 0334  NA 140 142  K 3.7 3.7  CL 112* 115*  CO2 18* 21*  GLUCOSE 118* 118*  BUN 19 14  CREATININE 1.17* 1.24*  CALCIUM 8.2* 8.2*  MG  --  2.0   Recent Labs    10/26/19 1112  AST 13*  ALT 9  ALKPHOS 108  BILITOT 0.4  PROT 6.0*  ALBUMIN 3.2*   Recent Labs    10/27/19 0516 10/27/19 0516 10/28/19 0334 10/28/19 0855  WBC 7.7  --  6.4  --   NEUTROABS  --   --  4.1  --   HGB 7.3*   < > 7.1* 7.3*  HCT 23.3*   < > 23.4* 23.8*  MCV 85.7  --  86.0  --   PLT 181  --  200  --    < > = values in this interval not displayed.      Assessment/Plan: Melena likely due to duodenal ulcer seen on capsule endoscopy (full report to be scanned). Continue IV PPI BID and change to PO BID at discharge to continue for 2 months and then take QD. Check H. Pylori serology and treat if positive. Hold Xarelto another 3 days if ok with primary team/cards. Agree with Miralax.   Lear Ng 10/28/2019, 12:31 PM  Questions please call [phone] ID: [name], female   DOB: 1946-03-27, 74 y.o.   MRN: [id number]

## 2019-10-28 NOTE — Progress Notes (Signed)
Patient ID: [name] [name], female   DOB: 10-18-1945, 74 y.o.   MRN: [id number]  PROGRESS NOTE    [name] [name]  [id number] DOB: Dec 16, 1945 DOA: 10/26/2019 PCP: [name], MD   Brief Narrative:  74 year old female with history of CAD status post CABG, A. fib on Xarelto, hypertension, COPD, stage III CKD and chronic combined CHF, recent hospitalization from 10/08/2019-10/11/2019 for upper GI bleeding with subsequent EGD and colonoscopy performed which did not show any active bleeding source presented with weakness and recurrent tarry stools.  Hemoglobin was 5.4.  She was started on IV Protonix.  GI was consulted.  Assessment & Plan:   Probable upper GI bleeding -Patient had a recent hospitalization for the same: EGD and colonoscopy did not reveal a bleeding source -Hemoglobin 5.4 on presentation.  Status post 2 units packed red cells transfusion. -Hemoglobin 7.1 this morning.  Will repeat hemoglobin again today.  Transfuse if hemoglobin less than 7 or if there is evidence of active bleeding. -GI following: Status post EGD and PillCam deployment today by GI.  No obvious bleeding source in upper GI endoscopy.  Capsule endoscopy report is pending. -Xarelto on hold -Monitor H&H.  DC IV fluids.  Paroxysmal A. Fib -Currently rate controlled.  Xarelto on hold.  Continue metoprolol  Chronic combined CHF CAD -Currently euvolemic. -Strict input output.  Daily weights.  Outpatient follow-up with cardiology -Continue metoprolol/statin  Hypertension -Monitor blood pressure.  Continue losartan and metoprolol.  COPD -Stable.  Chronic kidney disease stage III -Creatinine 1.24.  Stable.  Baseline creatinine around 1.4-1.5    DVT prophylaxis: SCDs Code Status: Full Family Communication: Spoke to patient at bedside Disposition Plan: Home in 1 to 2 days once hemoglobin stabilizes and once cleared by GI  Consultants: GI  Procedures:  EGD with PillCam deployment on 10/27/2019  Impression:                - Widely patent Schatzki ring.                           - 6 cm hiatal hernia.                           - Normal gastric body, greater curvature of the                            stomach, lesser curvature of the stomach, incisura                            and antrum.                           - Nodular mucosa in the duodenal bulb.                           - Normal first portion of the duodenum and second                            portion of the duodenum.                           - Successful completion of the Video Capsule  Enteroscope placement.                           - No specimens collected.  Antimicrobials: None   Subjective: Patient seen and examined at bedside.  Patient denies worsening abdominal pain, bloody stools, fever or vomiting.  States that her last bowel movement was still dark in color. Objective: Vitals:   10/27/19 2052 10/28/19 0412 10/28/19 0431 10/28/19 0843  BP: (!) 157/89  (!) 147/96 (!) 128/49  Pulse: 74  (!) 55 62  Resp: 20  17 20   Temp: 97.9 F (36.6 C)  98.1 F (36.7 C) 98.3 F (36.8 C)  TempSrc:   Oral Oral  SpO2: 96%  100% 100%  Weight: 101.2 kg 101.2 kg    Height:        Intake/Output Summary (Last 24 hours) at 10/28/2019 0943 Last data filed at 10/28/2019 0844 Gross per 24 hour  Intake 1390 ml  Output 1050 ml  Net 340 ml   Filed Weights   10/26/19 2122 10/27/19 2052 10/28/19 0412  Weight: 101.2 kg 101.2 kg 101.2 kg    Examination:  General exam: More awake today.  No distress.   Respiratory system: Bilateral decreased breath sounds at bases with some scattered crackles Cardiovascular system: S1 & S2 heard, mild intermittent tachycardia gastrointestinal system: Abdomen is nondistended, soft and nontender.  Bowel sounds heard.   Extremities: No cyanosis, clubbing; trace lower extremity edema    Data Reviewed: I have personally reviewed following labs and imaging studies  CBC: Recent Labs   Lab 10/26/19 1112 10/26/19 2056 10/27/19 0516 10/28/19 0334 10/28/19 0855  WBC 9.4 8.8 7.7 6.4  --   NEUTROABS  --   --   --  4.1  --   HGB 5.4* 7.5* 7.3* 7.1* 7.3*  HCT 18.8* 23.6* 23.3* 23.4* 23.8*  MCV 90.0 83.1 85.7 86.0  --   PLT 205 182 181 200  --    Basic Metabolic Panel: Recent Labs  Lab 10/26/19 1112 10/27/19 0516 10/28/19 0334  NA 140 140 142  K 4.3 3.7 3.7  CL 111 112* 115*  CO2 15* 18* 21*  GLUCOSE 137* 118* 118*  BUN 26* 19 14  CREATININE 1.33* 1.17* 1.24*  CALCIUM 8.6* 8.2* 8.2*  MG  --   --  2.0   GFR: Estimated Creatinine Clearance: 48.6 mL/min (A) (by C-G formula based on SCr of 1.24 mg/dL (H)). Liver Function Tests: Recent Labs  Lab 10/26/19 1112  AST 13*  ALT 9  ALKPHOS 108  BILITOT 0.4  PROT 6.0*  ALBUMIN 3.2*   No results for input(s): LIPASE, AMYLASE in the last 168 hours. No results for input(s): AMMONIA in the last 168 hours. Coagulation Profile: Recent Labs  Lab 10/26/19 2056  INR 1.5*   Cardiac Enzymes: No results for input(s): CKTOTAL, CKMB, CKMBINDEX, TROPONINI in the last 168 hours. BNP (last 3 results) No results for input(s): PROBNP in the last 8760 hours. HbA1C: No results for input(s): HGBA1C in the last 72 hours. CBG: No results for input(s): GLUCAP in the last 168 hours. Lipid Profile: No results for input(s): CHOL, HDL, LDLCALC, TRIG, CHOLHDL, LDLDIRECT in the last 72 hours. Thyroid Function Tests: No results for input(s): TSH, T4TOTAL, FREET4, T3FREE, THYROIDAB in the last 72 hours. Anemia Panel: No results for input(s): VITAMINB12, FOLATE, FERRITIN, TIBC, IRON, RETICCTPCT in the last 72 hours. Sepsis Labs: No results for input(s): PROCALCITON, LATICACIDVEN in the last 168  hours.  Recent Results (from the past 240 hour(s))  SARS CORONAVIRUS 2 (TAT 6-24 HRS) Nasopharyngeal Nasopharyngeal Swab     Status: None   Collection Time: 10/26/19  1:00 PM   Specimen: Nasopharyngeal Swab  Result Value Ref Range Status     SARS Coronavirus 2 NEGATIVE NEGATIVE Final    Comment: (NOTE) SARS-CoV-2 target nucleic acids are NOT DETECTED. The SARS-CoV-2 RNA is generally detectable in upper and lower respiratory specimens during the acute phase of infection. Negative results do not preclude SARS-CoV-2 infection, do not rule out co-infections with other pathogens, and should not be used as the sole basis for treatment or other patient management decisions. Negative results must be combined with clinical observations, patient history, and epidemiological information. The expected result is Negative. Fact Sheet for Patients: SugarRoll.be Fact Sheet for Healthcare Providers: https://www.woods-mathews.com/ This test is not yet approved or cleared by the [place] FDA and  has been authorized for detection and/or diagnosis of SARS-CoV-2 by FDA under an Emergency Use Authorization (EUA). This EUA will remain  in effect (meaning this test can be used) for the duration of the COVID-19 declaration under Section 56 4(b)(1) of the Act, 21 U.S.C. section 360bbb-3(b)(1), unless the authorization is terminated or revoked sooner. Performed at [facility], [place] [address], [place], [place] [address]          Radiology Studies: No results found.      Scheduled Meds: . allopurinol  300 mg Oral Daily  . atorvastatin  10 mg Oral Daily  . losartan  25 mg Oral BID  . metoprolol succinate  50 mg Oral Daily  . pantoprazole  40 mg Intravenous Q12H  . sodium chloride flush  3 mL Intravenous Q12H   Continuous Infusions:         Aline August, MD [facility] 10/28/2019, 9:43 AM

## 2019-10-28 NOTE — Progress Notes (Signed)
Awaiting capsule results, to be read today/tomorrow by my partner Dr. Michail Sermon.  [facility] will revisit tomorrow, once these results are known.

## 2019-10-29 DIAGNOSIS — J449 Chronic obstructive pulmonary disease, unspecified: Secondary | ICD-10-CM

## 2019-10-29 LAB — BASIC METABOLIC PANEL
Anion gap: 10 (ref 5–15)
BUN: 14 mg/dL (ref 8–23)
CO2: 21 mmol/L — ABNORMAL LOW (ref 22–32)
Calcium: 8.6 mg/dL — ABNORMAL LOW (ref 8.9–10.3)
Chloride: 110 mmol/L (ref 98–111)
Creatinine, Ser: 1.32 mg/dL — ABNORMAL HIGH (ref 0.44–1.00)
GFR calc Af Amer: 46 mL/min — ABNORMAL LOW (ref >60)
GFR calc non Af Amer: 40 mL/min — ABNORMAL LOW (ref >60)
Glucose, Bld: 112 mg/dL — ABNORMAL HIGH (ref 70–99)
Potassium: 4 mmol/L (ref 3.5–5.1)
Sodium: 141 mmol/L (ref 135–145)

## 2019-10-29 LAB — CBC WITH DIFFERENTIAL/PLATELET
Abs Immature Granulocytes: 0.03 10*3/uL (ref 0.00–0.07)
Basophils Absolute: 0 10*3/uL (ref 0.0–0.1)
Basophils Relative: 1 %
Eosinophils Absolute: 0.2 10*3/uL (ref 0.0–0.5)
Eosinophils Relative: 2 %
HCT: 24.2 % — ABNORMAL LOW (ref 36.0–46.0)
Hemoglobin: 7.3 g/dL — ABNORMAL LOW (ref 12.0–15.0)
Immature Granulocytes: 0 %
Lymphocytes Relative: 22 %
Lymphs Abs: 1.7 10*3/uL (ref 0.7–4.0)
MCH: 26.1 pg (ref 26.0–34.0)
MCHC: 30.2 g/dL (ref 30.0–36.0)
MCV: 86.4 fL (ref 80.0–100.0)
Monocytes Absolute: 0.5 10*3/uL (ref 0.1–1.0)
Monocytes Relative: 6 %
Neutro Abs: 5.2 10*3/uL (ref 1.7–7.7)
Neutrophils Relative %: 69 %
Platelets: 211 10*3/uL (ref 150–400)
RBC: 2.8 MIL/uL — ABNORMAL LOW (ref 3.87–5.11)
RDW: 16.9 % — ABNORMAL HIGH (ref 11.5–15.5)
WBC: 7.6 10*3/uL (ref 4.0–10.5)
nRBC: 0 % (ref 0.0–0.2)

## 2019-10-29 LAB — MAGNESIUM: Magnesium: 1.9 mg/dL (ref 1.7–2.4)

## 2019-10-29 LAB — H. PYLORI ANTIBODY, IGG: H Pylori IgG: 0.69 Index Value (ref 0.00–0.79)

## 2019-10-29 MED ORDER — PANTOPRAZOLE SODIUM 40 MG PO TBEC: 40.0000 mg | DELAYED_RELEASE_TABLET | Freq: Two times a day (BID) | ORAL | 1 refills | Status: DC

## 2019-10-29 MED ORDER — POLYETHYLENE GLYCOL 3350 17 G PO PACK: 17.0000 g | PACK | Freq: Every day | ORAL | 0 refills | Status: DC | PRN

## 2019-10-29 MED ORDER — PANTOPRAZOLE SODIUM 40 MG PO TBEC: 40.0000 mg | DELAYED_RELEASE_TABLET | Freq: Every day | ORAL | 1 refills | Status: DC

## 2019-10-29 MED ORDER — FERROUS SULFATE 325 (65 FE) MG PO TBEC: 325.0000 mg | DELAYED_RELEASE_TABLET | Freq: Every day | ORAL | 0 refills | Status: DC

## 2019-10-29 MED ORDER — SENNOSIDES-DOCUSATE SODIUM 8.6-50 MG PO TABS: 1.0000 | ORAL_TABLET | Freq: Two times a day (BID) | ORAL | 0 refills | Status: DC

## 2019-10-29 MED ORDER — RIVAROXABAN 20 MG PO TABS: 20.0000 mg | ORAL_TABLET | Freq: Every day | ORAL | Status: DC

## 2019-10-29 MED ORDER — ONDANSETRON HCL 4 MG PO TABS: 4.0000 mg | ORAL_TABLET | Freq: Four times a day (QID) | ORAL | 0 refills | Status: DC | PRN

## 2019-10-29 NOTE — Plan of Care (Signed)
  Problem: Education: Goal: Ability to identify signs and symptoms of gastrointestinal bleeding will improve Outcome: Completed/Met   Problem: Bowel/Gastric: Goal: Will show no signs and symptoms of gastrointestinal bleeding Outcome: Completed/Met   Problem: Fluid Volume: Goal: Will show no signs and symptoms of excessive bleeding Outcome: Completed/Met   Problem: Clinical Measurements: Goal: Complications related to the disease process, condition or treatment will be avoided or minimized Outcome: Completed/Met

## 2019-10-29 NOTE — Progress Notes (Signed)
[name] to be discharged Home per MD order. Discussed prescriptions and follow up appointments with the patient. Prescriptions given to patient; medication list explained in detail. Patient verbalized understanding.  Skin clean, dry and intact without evidence of skin break down, no evidence of skin tears noted. IV catheter discontinued intact. Site without signs and symptoms of complications. Dressing and pressure applied. Pt denies pain at the site currently. No complaints noted.  Patient free of lines, drains, and wounds.   An After Visit Summary (AVS) was printed and given to the patient. Patient escorted via wheelchair, and discharged home via private auto.  Shela Commons, RN

## 2019-10-29 NOTE — Progress Notes (Signed)
DISCHARGE NOTE HOME BRIZIA HOLTGREWE to be discharged Home per MD order. Discussed prescriptions and follow up appointments with the patient. Prescriptions given to patient; medication list explained in detail. Patient verbalized understanding.  Skin clean, dry and intact without evidence of skin break down, no evidence of skin tears noted. IV catheter discontinued intact. Site without signs and symptoms of complications. Dressing and pressure applied. Pt denies pain at the site currently. No complaints noted.  Patient free of lines, drains, and wounds.   An After Visit Summary (AVS) was printed and given to the patient. Patient escorted via wheelchair, and discharged home via private auto.  [name] BSN, RN3

## 2019-10-29 NOTE — Discharge Summary (Signed)
Physician Discharge Summary  [name] [name] [id number] DOB: Dec 31, 1945 DOA: 10/26/2019  PCP: [name], MD  Admit date: 10/26/2019 Discharge date: 10/29/2019  Admitted From: Home Disposition: Home  Recommendations for Outpatient Follow-up:  1. Follow up with PCP in 1 week with repeat CBC/BMP 2. Outpatient follow-up with GI and cardiology 3. Hold Xarelto for 3 days and restart from 11/01/2019 4. Follow up in ED if symptoms worsen or new appear   Home Health: No Equipment/Devices: None  Discharge Condition: Stable CODE STATUS: Full Diet recommendation: Heart healthy  Brief/Interim Summary: 74 year old female with history of CAD status post CABG, A. fib on Xarelto, hypertension, COPD, stage III CKD and chronic combined CHF, recent hospitalization from 10/08/2019-10/11/2019 for upper GI bleeding with subsequent EGD and colonoscopy performed which did not show any active bleeding source presented with weakness and recurrent tarry stools.  Hemoglobin was 5.4.  She was started on IV Protonix.  GI was consulted.  She underwent 2 units packed red cells transfusion.  She underwent EGD which was unremarkable and capsule endoscopy which showed duodenal ulcer with bleeding and stigmata.  GI recommended oral Protonix and outpatient follow-up.  Hemoglobin has remained stable.  She will be discharged home today with outpatient follow-up with PCP and GI.  Discharge Diagnoses:  Probable upper GI bleeding -Patient had a recent hospitalization for the same: EGD and colonoscopy did not reveal a bleeding source -Hemoglobin 5.4 on presentation.  Status post 2 units packed red cells transfusion. -GI follow-up appreciated: Status post EGD and capsule endoscopy.  No obvious bleeding source in EGD.  Capsule endoscopyshowed duodenal ulcer with bleeding and stigmata.  GI recommended oral Protonix 40 mg twice a day for 2 months and subsequently once a day and outpatient follow-up with GI.  Outpatient follow-up  with GI for H. pylori serology.  Will hold Xarelto for 3 days as per GI recommendations and restart on 11/01/2019 - hemoglobin has remained stable, 7.3 today.  No further black or bloody stools reported. - She will be discharged home today with outpatient follow-up with PCP and GI.  We will also start once a day oral iron supplementation.  Paroxysmal A. Fib -Currently rate controlled.  Xarelto plan as above.  Continue metoprolol  Chronic combined CHF CAD -Currently euvolemic. -Outpatient follow-up with cardiology -Continue metoprolol/losartan/statin  Hypertension -Outpatient follow-up.  Continue losartan and metoprolol.  COPD -Stable.  Chronic kidney disease stage III -Creatinine 1.32.  Stable.  Baseline creatinine around 1.4-1.5  Discharge Instructions  Discharge Instructions    Ambulatory referral to Cardiology   Complete by: As directed    chf followup   Diet - low sodium heart healthy   Complete by: As directed    Increase activity slowly   Complete by: As directed      Allergies as of 10/29/2019      Reactions   Bee Venom Anaphylaxis   Ivp Dye [iodinated Diagnostic Agents] Anaphylaxis   Codeine Nausea And Vomiting   Entresto [sacubitril-valsartan]    Chest pain    Shrimp [shellfish Allergy] Swelling      Medication List    STOP taking these medications   spironolactone 25 MG tablet Commonly known as: ALDACTONE     TAKE these medications   acetaminophen 500 MG tablet Commonly known as: TYLENOL Take 500 mg by mouth every 6 (six) hours as needed for moderate pain.   allopurinol 300 MG tablet Commonly known as: ZYLOPRIM Take 1 tablet (300 mg total) by mouth daily.   atorvastatin 10  MG tablet Commonly known as: LIPITOR Take 10 mg by mouth daily.   ferrous sulfate 325 (65 FE) MG EC tablet Take 1 tablet (325 mg total) by mouth daily with breakfast.   furosemide 20 MG tablet Commonly known as: Lasix Take 1 tablet (20 mg total) by mouth daily.    losartan 25 MG tablet Commonly known as: COZAAR Take 1 tablet (25 mg total) by mouth 2 (two) times daily.   metoprolol succinate 50 MG 24 hr tablet Commonly known as: Toprol XL Take 1 tablet (50 mg total) by mouth daily. Take with or immediately following a meal.   ondansetron 4 MG tablet Commonly known as: ZOFRAN Take 1 tablet (4 mg total) by mouth every 6 (six) hours as needed for nausea.   pantoprazole 40 MG tablet Commonly known as: PROTONIX Take 1 tablet (40 mg total) by mouth 2 (two) times daily. What changed: when to take this   polyethylene glycol 17 g packet Commonly known as: MIRALAX / GLYCOLAX Take 17 g by mouth daily as needed.   rivaroxaban 20 MG Tabs tablet Commonly known as: XARELTO Take 1 tablet (20 mg total) by mouth daily with supper. Resume from 11/01/2019 Start taking on: November 01, 2019 What changed:   additional instructions  These instructions start on November 01, 2019. If you are unsure what to do until then, ask your doctor or other care provider. Notes to patient: **Start taking on 3/19** (do not take before then to help intestine ulcer to heal)   senna-docusate 8.6-50 MG tablet Commonly known as: Senokot-S Take 1 tablet by mouth 2 (two) times daily.      Follow-up Information    [name], MD. Schedule an appointment as soon as possible for a visit.   Specialty: Internal Medicine Why: with repeat cbc/bmp Contact information: [address], [address] [place] [address] [phone]        Jerline Pain, MD .   Specialty: Cardiology Contact information: [phone] [address] [place] [address] [place] [address] [phone]        Otis Brace, MD. Schedule an appointment as soon as possible for a visit in 2 week(s).   Specialty: Gastroenterology Contact information: [place] [address] [phone]          Allergies  Allergen Reactions  . Bee Venom Anaphylaxis  . Ivp Dye  [Iodinated Diagnostic Agents] Anaphylaxis  . Codeine Nausea And Vomiting  . Entresto [Sacubitril-Valsartan]     Chest pain   . Shrimp [Shellfish Allergy] Swelling    Consultations:  GI   Procedures/Studies: DG Chest 2 View  Result Date: 10/09/2019 CLINICAL DATA:  Atrial fibrillation, palpitations EXAM: CHEST - 2 VIEW COMPARISON:  06/11/2019 FINDINGS: Frontal and lateral views of the chest demonstrates stable postsurgical changes from median sternotomy. The cardiac silhouette is unremarkable. Stable ectasia of the thoracic aorta. Lungs are hyperinflated. No airspace disease, effusion, or pneumothorax. No acute bony abnormalities. IMPRESSION: 1. Stable background emphysema.  No acute process. Electronically Signed   By: Randa Ngo M.D.   On: 10/09/2019 00:33   ECHOCARDIOGRAM COMPLETE  Result Date: 10/10/2019    ECHOCARDIOGRAM REPORT   Patient Name:   LADASHA COPELAN Date of Exam: 10/10/2019 Medical Rec #:  [id number]     Height:       67.0 in Accession #:    [id number]    Weight:       228.4 lb Date of Birth:  11-17-1945  BSA:          2.140 m Patient Age:    74 years      BP:           104/62 mmHg Patient Gender: F             HR:           96 bpm. Exam Location:  Inpatient Procedure: 2D Echo Indications:    427.31 atrial fibrillation  History:        Patient has prior history of Echocardiogram examinations, most                 recent 08/03/2018. Cardiomyopathy, CAD, Prior CABG, COPD,                 Arrythmias:Atrial Fibrillation; Risk Factors:Hypertension and                 Former Smoker. Hx of cardiac arrest.  Sonographer:    Jannett Celestine RDCS (AE) Referring Phys: [id number] Darreld Mclean  Sonographer Comments: Image acquisition challenging due to patient body habitus and Image acquisition challenging due to COPD. IMPRESSIONS  1. Left ventricular ejection fraction, by estimation, is 60 to 65%. The left ventricle has normal function. The left ventricle has no regional wall motion  abnormalities. Left ventricular diastolic parameters are indeterminate. Elevated left ventricular end-diastolic pressure.  2. Right ventricular systolic function is normal. The right ventricular size is normal. Tricuspid regurgitation signal is inadequate for assessing PA pressure.  3. The mitral valve is normal in structure and function. No evidence of mitral valve regurgitation. No evidence of mitral stenosis.  4. The aortic valve has an indeterminant number of cusps. Aortic valve regurgitation is not visualized. No aortic stenosis is present.  5. The inferior vena cava is normal in size with greater than 50% respiratory variability, suggesting right atrial pressure of 3 mmHg. FINDINGS  Left Ventricle: Left ventricular ejection fraction, by estimation, is 60 to 65%. The left ventricle has normal function. The left ventricle has no regional wall motion abnormalities. The left ventricular internal cavity size was normal in size. There is  no left ventricular hypertrophy. Left ventricular diastolic parameters are indeterminate. Elevated left ventricular end-diastolic pressure. Right Ventricle: The right ventricular size is normal. No increase in right ventricular wall thickness. Right ventricular systolic function is normal. Tricuspid regurgitation signal is inadequate for assessing PA pressure. Left Atrium: Left atrial size was normal in size. Right Atrium: Right atrial size was normal in size. Pericardium: There is no evidence of pericardial effusion. Mitral Valve: The mitral valve is normal in structure and function. Normal mobility of the mitral valve leaflets. Mild mitral annular calcification. No evidence of mitral valve regurgitation. No evidence of mitral valve stenosis. Tricuspid Valve: The tricuspid valve is normal in structure. Tricuspid valve regurgitation is not demonstrated. No evidence of tricuspid stenosis. Aortic Valve: The aortic valve has an indeterminant number of cusps. . There is moderate  thickening and moderate calcification of the aortic valve. Aortic valve regurgitation is not visualized. No aortic stenosis is present. There is moderate thickening of the aortic valve. There is moderate calcification of the aortic valve. Pulmonic Valve: The pulmonic valve was normal in structure. Pulmonic valve regurgitation is not visualized. No evidence of pulmonic stenosis. Aorta: The aortic root is normal in size and structure. Venous: The inferior vena cava is normal in size with greater than 50% respiratory variability, suggesting right atrial pressure of 3 mmHg. IAS/Shunts: The interatrial septum appears  to be lipomatous. No atrial level shunt detected by color flow Doppler.  LEFT VENTRICLE PLAX 2D LVIDd:         4.60 cm  Diastology LVIDs:         2.90 cm  LV e' lateral:   10.00 cm/s LV PW:         1.10 cm  LV E/e' lateral: 9.1 LV IVS:        1.00 cm  LV e' medial:    5.44 cm/s LVOT diam:     2.00 cm  LV E/e' medial:  16.7 LV SV:         53 LV SV Index:   25 LVOT Area:     3.14 cm  LEFT ATRIUM             Index LA diam:        3.90 cm 1.82 cm/m LA Vol (A2C):   59.8 ml 27.95 ml/m LA Vol (A4C):   48.1 ml 22.48 ml/m LA Biplane Vol: 55.0 ml 25.71 ml/m  AORTIC VALVE LVOT Vmax:   79.00 cm/s LVOT Vmean:  50.500 cm/s LVOT VTI:    0.168 m  AORTA Ao Root diam: 3.20 cm MITRAL VALVE MV Area (PHT): 2.73 cm    SHUNTS MV Decel Time: 278 msec    Systemic VTI:  0.17 m MV E velocity: 90.80 cm/s  Systemic Diam: 2.00 cm MV A velocity: 82.70 cm/s MV E/A ratio:  1.10 Fransico Him MD Electronically signed by Fransico Him MD Signature Date/Time: 10/10/2019/10:36:31 AM    Final     EGD with PillCam deployment on 10/27/2019  Impression: - Widely patent Schatzki ring. - 6 cm hiatal hernia. - Normal gastric body, greater curvature of the  stomach, lesser curvature of the stomach, incisura  and  antrum. - Nodular mucosa in the duodenal bulb. - Normal first portion of the duodenum and second  portion of the duodenum. - Successful completion of the Video Capsule  Enteroscope placement. - No specimens collected.  Capsule study showed a duodenal ulcer with bleeding and stigmata   Subjective: Patient seen and examined at bedside.  She feels much better and wants to go home today.  Denies any overnight black or bloody stools.  No overnight fever, worsening shortness of breath reported.  Discharge Exam: Vitals:   10/29/19 0528 10/29/19 0815  BP: 139/63 137/64  Pulse: 69 79  Resp: 17 18  Temp: 98.5 F (36.9 C) 97.7 F (36.5 C)  SpO2: 100% 100%    General: Pt is alert, awake, not in acute distress Cardiovascular: rate controlled, S1/S2 + Respiratory: bilateral decreased breath sounds at bases Abdominal: Soft, obese, NT, ND, bowel sounds + Extremities: no edema, no cyanosis    The results of significant diagnostics from this hospitalization (including imaging, microbiology, ancillary and laboratory) are listed below for reference.     Microbiology: Recent Results (from the past 240 hour(s))  SARS CORONAVIRUS 2 (TAT 6-24 HRS) Nasopharyngeal Nasopharyngeal Swab     Status: None   Collection Time: 10/26/19  1:00 PM   Specimen: Nasopharyngeal Swab  Result Value Ref Range Status   SARS Coronavirus 2 NEGATIVE NEGATIVE Final    Comment: (NOTE) SARS-CoV-2 target nucleic acids are NOT DETECTED. The SARS-CoV-2 RNA is generally detectable in upper and lower respiratory specimens during the acute phase of infection. Negative results do not preclude SARS-CoV-2 infection, do not rule out co-infections with other pathogens, and should not be used as the sole basis for treatment or  other patient management decisions. Negative  results must be combined with clinical observations, patient history, and epidemiological information. The expected result is Negative. Fact Sheet for Patients: SugarRoll.be Fact Sheet for Healthcare Providers: https://www.woods-mathews.com/ This test is not yet approved or cleared by the [place] FDA and  has been authorized for detection and/or diagnosis of SARS-CoV-2 by FDA under an Emergency Use Authorization (EUA). This EUA will remain  in effect (meaning this test can be used) for the duration of the COVID-19 declaration under Section 56 4(b)(1) of the Act, 21 U.S.C. section 360bbb-3(b)(1), unless the authorization is terminated or revoked sooner. Performed at [facility], [place] [address] [place], [place] [address]      Labs: BNP (last 3 results) No results for input(s): BNP in the last 8760 hours. Basic Metabolic Panel: Recent Labs  Lab 10/26/19 1112 10/27/19 0516 10/28/19 0334 10/29/19 0553  NA 140 140 142 141  K 4.3 3.7 3.7 4.0  CL 111 112* 115* 110  CO2 15* 18* 21* 21*  GLUCOSE 137* 118* 118* 112*  BUN 26* 19 14 14   CREATININE 1.33* 1.17* 1.24* 1.32*  CALCIUM 8.6* 8.2* 8.2* 8.6*  MG  --   --  2.0 1.9   Liver Function Tests: Recent Labs  Lab 10/26/19 1112  AST 13*  ALT 9  ALKPHOS 108  BILITOT 0.4  PROT 6.0*  ALBUMIN 3.2*   No results for input(s): LIPASE, AMYLASE in the last 168 hours. No results for input(s): AMMONIA in the last 168 hours. CBC: Recent Labs  Lab 10/26/19 1112 10/26/19 1112 10/26/19 2056 10/27/19 0516 10/28/19 0334 10/28/19 0855 10/29/19 0553  WBC 9.4  --  8.8 7.7 6.4  --  7.6  NEUTROABS  --   --   --   --  4.1  --  5.2  HGB 5.4*   < > 7.5* 7.3* 7.1* 7.3* 7.3*  HCT 18.8*   < > 23.6* 23.3* 23.4* 23.8* 24.2*  MCV 90.0  --  83.1 85.7 86.0  --  86.4  PLT 205  --  182 181 200  --  211   < > = values in this interval not displayed.   Cardiac Enzymes: No results for input(s):  CKTOTAL, CKMB, CKMBINDEX, TROPONINI in the last 168 hours. BNP: Invalid input(s): POCBNP CBG: No results for input(s): GLUCAP in the last 168 hours. D-Dimer No results for input(s): DDIMER in the last 72 hours. Hgb A1c No results for input(s): HGBA1C in the last 72 hours. Lipid Profile No results for input(s): CHOL, HDL, LDLCALC, TRIG, CHOLHDL, LDLDIRECT in the last 72 hours. Thyroid function studies No results for input(s): TSH, T4TOTAL, T3FREE, THYROIDAB in the last 72 hours.  Invalid input(s): FREET3 Anemia work up No results for input(s): VITAMINB12, FOLATE, FERRITIN, TIBC, IRON, RETICCTPCT in the last 72 hours. Urinalysis    Component Value Date/Time   COLORURINE AMBER (A) 10/16/2017 0353   APPEARANCEUR CLOUDY (A) 10/16/2017 0353   LABSPEC 1.017 10/16/2017 0353   PHURINE 7.0 10/16/2017 0353   GLUCOSEU NEGATIVE 10/16/2017 0353   HGBUR NEGATIVE 10/16/2017 0353   BILIRUBINUR NEGATIVE 10/16/2017 0353   BILIRUBINUR ng 12/25/2013 1342   KETONESUR NEGATIVE 10/16/2017 0353   PROTEINUR 30 (A) 10/16/2017 0353   UROBILINOGEN 0.2 04/11/2017 1402   NITRITE NEGATIVE 10/16/2017 0353   LEUKOCYTESUR SMALL (A) 10/16/2017 0353   Sepsis Labs Invalid input(s): PROCALCITONIN,  WBC,  LACTICIDVEN Microbiology Recent Results (from the past 240 hour(s))  SARS CORONAVIRUS 2 (TAT 6-24  HRS) Nasopharyngeal Nasopharyngeal Swab     Status: None   Collection Time: 10/26/19  1:00 PM   Specimen: Nasopharyngeal Swab  Result Value Ref Range Status   SARS Coronavirus 2 NEGATIVE NEGATIVE Final    Comment: (NOTE) SARS-CoV-2 target nucleic acids are NOT DETECTED. The SARS-CoV-2 RNA is generally detectable in upper and lower respiratory specimens during the acute phase of infection. Negative results do not preclude SARS-CoV-2 infection, do not rule out co-infections with other pathogens, and should not be used as the sole basis for treatment or other patient management decisions. Negative results must  be combined with clinical observations, patient history, and epidemiological information. The expected result is Negative. Fact Sheet for Patients: SugarRoll.be Fact Sheet for Healthcare Providers: https://www.woods-mathews.com/ This test is not yet approved or cleared by the [place] FDA and  has been authorized for detection and/or diagnosis of SARS-CoV-2 by FDA under an Emergency Use Authorization (EUA). This EUA will remain  in effect (meaning this test can be used) for the duration of the COVID-19 declaration under Section 56 4(b)(1) of the Act, 21 U.S.C. section 360bbb-3(b)(1), unless the authorization is terminated or revoked sooner. Performed at [facility], [place] [address] [place], [place] [address]      Time coordinating discharge: 35 minutes  SIGNED:   Aline August, MD  [facility] 10/29/2019, 11:11 AM

## 2019-10-30 ENCOUNTER — Other Ambulatory Visit: Payer: Self-pay

## 2019-10-30 DIAGNOSIS — N183 Chronic kidney disease, stage 3 unspecified: Secondary | ICD-10-CM | POA: Diagnosis not present

## 2019-10-30 DIAGNOSIS — K922 Gastrointestinal hemorrhage, unspecified: Secondary | ICD-10-CM | POA: Diagnosis not present

## 2019-10-30 DIAGNOSIS — I10 Essential (primary) hypertension: Secondary | ICD-10-CM | POA: Diagnosis not present

## 2019-10-30 NOTE — Patient Outreach (Addendum)
  [facility]) Care Management Chronic Special Needs Program    10/30/2019  Name: [name] [name], [name] [name]: 08-22-1945  MRN: [id number]   Ms. Latorsha Kogan is enrolled in a chronic special needs plan for Heart Failure. Client readmitted 10/26/2019 with upper GI bleed and discharged 10/29/2019. Client will receive [employer] transition of care call. RNCM will follow up on an red flags as indicated.  Goals Addressed            This Visit's Progress   . General - Client will not be readmitted within 30 days (C-SNP) discharge date revised 10/29/2019       Goal renewed: Please follow discharge instructions and call provider if you have any questions. Please attend all follow up appointments as scheduled. Please take your medications as prescribed. Please call 24 hour nurse advice line as needed. [phone]).         Plan: care plan updated and sent to client; care plan sent to primary care.RNCM will follow up one month post discharge to assess for any additional care management needs.  Thea Silversmith, RN, MSN, [facility] [place] [phone]

## 2019-11-01 DIAGNOSIS — R195 Other fecal abnormalities: Secondary | ICD-10-CM | POA: Diagnosis not present

## 2019-11-07 DIAGNOSIS — K269 Duodenal ulcer, unspecified as acute or chronic, without hemorrhage or perforation: Secondary | ICD-10-CM | POA: Diagnosis not present

## 2019-11-07 DIAGNOSIS — Z86010 Personal history of colonic polyps: Secondary | ICD-10-CM | POA: Diagnosis not present

## 2019-11-07 DIAGNOSIS — K3189 Other diseases of stomach and duodenum: Secondary | ICD-10-CM | POA: Diagnosis not present

## 2019-11-07 DIAGNOSIS — D62 Acute posthemorrhagic anemia: Secondary | ICD-10-CM | POA: Diagnosis not present

## 2019-11-11 ENCOUNTER — Other Ambulatory Visit (HOSPITAL_COMMUNITY): Payer: Self-pay | Admitting: *Deleted

## 2019-11-11 MED ORDER — METOPROLOL SUCCINATE ER 50 MG PO TB24: 50.0000 mg | ORAL_TABLET | Freq: Every day | ORAL | 1 refills | Status: DC

## 2019-11-12 DIAGNOSIS — I10 Essential (primary) hypertension: Secondary | ICD-10-CM | POA: Diagnosis not present

## 2019-11-12 DIAGNOSIS — E7800 Pure hypercholesterolemia, unspecified: Secondary | ICD-10-CM | POA: Diagnosis not present

## 2019-11-12 DIAGNOSIS — I509 Heart failure, unspecified: Secondary | ICD-10-CM | POA: Diagnosis not present

## 2019-11-12 DIAGNOSIS — I4891 Unspecified atrial fibrillation: Secondary | ICD-10-CM | POA: Diagnosis not present

## 2019-11-12 DIAGNOSIS — M1990 Unspecified osteoarthritis, unspecified site: Secondary | ICD-10-CM | POA: Diagnosis not present

## 2019-11-12 DIAGNOSIS — N183 Chronic kidney disease, stage 3 unspecified: Secondary | ICD-10-CM | POA: Diagnosis not present

## 2019-11-12 DIAGNOSIS — I25810 Atherosclerosis of coronary artery bypass graft(s) without angina pectoris: Secondary | ICD-10-CM | POA: Diagnosis not present

## 2019-11-12 DIAGNOSIS — I5020 Unspecified systolic (congestive) heart failure: Secondary | ICD-10-CM | POA: Diagnosis not present

## 2019-11-12 DIAGNOSIS — M8580 Other specified disorders of bone density and structure, unspecified site: Secondary | ICD-10-CM | POA: Diagnosis not present

## 2019-11-12 DIAGNOSIS — I214 Non-ST elevation (NSTEMI) myocardial infarction: Secondary | ICD-10-CM | POA: Diagnosis not present

## 2019-11-12 DIAGNOSIS — J449 Chronic obstructive pulmonary disease, unspecified: Secondary | ICD-10-CM | POA: Diagnosis not present

## 2019-11-14 DIAGNOSIS — R21 Rash and other nonspecific skin eruption: Secondary | ICD-10-CM | POA: Diagnosis not present

## 2019-11-14 DIAGNOSIS — D649 Anemia, unspecified: Secondary | ICD-10-CM | POA: Diagnosis not present

## 2019-11-14 DIAGNOSIS — K922 Gastrointestinal hemorrhage, unspecified: Secondary | ICD-10-CM | POA: Diagnosis not present

## 2019-11-19 NOTE — Progress Notes (Signed)
Cardiology Office Note   Date:  12/02/2019   ID:  ANILU COUNCILMAN, [name] [name] [birth year], MRN [id number]  PCP:  [name], MD  Cardiologist: [name], MD / [name], MD  Chief Complaint  Patient presents with  . Follow-up    History of Present Illness: [name] [name] is a 74 y.o. female who presents for hospital follow up, seen for [name].   [name] [name] has a hx of CAD s/p CABG x2 in 2016, chronic combined CHF/ischemic cardiomyopathy with EF of 30-35% on Echo in 2019>> with normalization per last echocardiogram, paroxysmal atrial fibrillation on Xarelto, COPD, hypertension, hyperlipidemia, pre-diabetes, CKD stage III, GERD, medication non-compliance, and prior tobacco use (quit in 2016).   She is typically followed by [name] and [name] for her cardiology care. She was initially seen in 11/2014 for atrial fibrillation and found to have a reduced LVEF at that time leading to an ischemic work-up which showed severe multivessel CAD with left main involvement. She ended up undergoing CABG x2 with LIMA to LAD and SVG to OM at that time. She has a history of medication non-compliance and has significant memory issues; therefore, she has not been felt to be a good ablation or anti-arrhythmic candidate in the past. Echo from 07/2018 showed LVEF of 30-35% with normal wall motion, grade 1 diastolic dysfunction, and mild MR. RV was also noted to be mildly dilated with moderately to severely reduced systolic function. She has previously been intolerant to [facility] due to chest pain. Most recent ischemic work-up was a Lexiscan Myoview in 08/2018 which was low risk and showed no reversible ischemia.   She presented to [facility] on 10/08/19 via EMS for AF. Patient reported that she had been seen by her PCP secondary to black stools and was advised to stop her Xarelto. She was treated with IV Cardizem 20mg  vis EMS and heart rate improved to the 110's but she was still in atrial fibrillation. On  10/09/19 she converted back to NSR. GI team reported she was stable to restart Xarelto 10/12/19 from their standpoint.   She was seen by [name] earlier today with reports she has been doing well since hospital discharge. Repeat echocardiogram 12/02/2019 with EF normalization at 60%.  No medication changes were made at that time. On my assessment, she denies chest pain, palpitations, bleeding in stool or urine, fatigue, dizziness, syncope, LE edema or cough.    Past Medical History:  Diagnosis Date  . Arthritis   . Asthma    ??  . Atopic dermatitis   . Cardiac arrest ([place]) 10/2014   [name]  . Cardiomyopathy, ischemic    a. EF previously low, improved to LVEF 50-55% as of May 2018  . Chronic combined systolic and diastolic heart failure ([place])   . CKD (chronic kidney disease), stage III   . COPD (chronic obstructive pulmonary disease) ([place])   . Esophageal dilatation 2013  . GERD (gastroesophageal reflux disease)   . Gout   . Headache   . Hypertension   . Hypokalemia   . Idiopathic angioedema   . LGI bleed 08/06/2017   a. felt to be hemorrhoidal during that admission (no drop in Hgb).  . Lower back pain   . Persistent atrial fibrillation (HCC)    a. h/o difficult to control rates (complicated by noncompliance), not felt to be a candidate for ablation or antiarrhythmic due to noncompliance.  . Personal history of noncompliance with medical treatment, presenting hazards  to health   . Prediabetes   . S/P CABG x 2 with clipping of LA appendage 11/14/2014   LIMA to LAD, SVG to OM, EVH via right thigh  . Urine incontinence   . Uterine fibroid     Past Surgical History:  Procedure Laterality Date  . BALLOON DILATION N/A 10/10/2019   Procedure: BALLOON DILATION;  Surgeon: Otis Brace, MD;  Location: [facility];  Service: Gastroenterology;  Laterality: N/A;  . BIOPSY  10/10/2019   Procedure: BIOPSY;  Surgeon: Otis Brace, MD;  Location: [place];  Service:  Gastroenterology;;  . BIOPSY  10/11/2019   Procedure: BIOPSY;  Surgeon: Otis Brace, MD;  Location: [place];  Service: Gastroenterology;;  . CARDIOVERSION N/A 11/18/2014   Procedure: CARDIOVERSION;  Surgeon: [name], MD;  Location: [place];  Service: Cardiovascular;  Laterality: N/A;  . CARDIOVERSION N/A 12/25/2017   Procedure: CARDIOVERSION;  Surgeon: [name], MD;  Location: [place];  Service: Cardiovascular;  Laterality: N/A;  . CLIPPING OF ATRIAL APPENDAGE N/A 11/14/2014   Procedure: CLIPPING OF ATRIAL APPENDAGE;  Surgeon: [name], MD;  Location: [place];  Service: Open Heart Surgery;  Laterality: N/A;  . COLONOSCOPY  2013  . COLONOSCOPY WITH PROPOFOL N/A 10/11/2019   Procedure: COLONOSCOPY WITH PROPOFOL;  Surgeon: Otis Brace, MD;  Location: [place];  Service: Gastroenterology;  Laterality: N/A;  . CORONARY ARTERY BYPASS GRAFT N/A 11/14/2014   Procedure: CORONARY ARTERY BYPASS GRAFTING (CABG)TIMES 2 USING LEFT INTERNAL MAMMARY ARTERY AND RIGHT SAPHENOUS VEIN HARVESTED ENDOSCOPICALLY;  Surgeon: [name], MD;  Location: [place];  Service: Open Heart Surgery;  Laterality: N/A;  . ESOPHAGOGASTRODUODENOSCOPY (EGD) WITH PROPOFOL N/A 10/10/2019   Procedure: ESOPHAGOGASTRODUODENOSCOPY (EGD) WITH PROPOFOL;  Surgeon: Otis Brace, MD;  Location: [facility];  Service: Gastroenterology;  Laterality: N/A;  . ESOPHAGOGASTRODUODENOSCOPY (EGD) WITH PROPOFOL N/A 10/27/2019   Procedure: ESOPHAGOGASTRODUODENOSCOPY (EGD) WITH PROPOFOL;  Surgeon: Ronnette Juniper, MD;  Location: [place];  Service: Gastroenterology;  Laterality: N/A;  . GIVENS CAPSULE STUDY N/A 10/27/2019   Procedure: GIVENS CAPSULE STUDY;  Surgeon: Ronnette Juniper, MD;  Location: [place];  Service: Gastroenterology;  Laterality: N/A;  . LEFT HEART CATHETERIZATION WITH CORONARY ANGIOGRAM N/A 11/04/2014   Procedure: LEFT HEART CATHETERIZATION WITH CORONARY ANGIOGRAM;  Surgeon: Troy Sine, MD;   Location: [facility] CATH LAB;  Service: Cardiovascular;  Laterality: N/A;  . POLYPECTOMY  10/11/2019   Procedure: POLYPECTOMY;  Surgeon: Otis Brace, MD;  Location: [place] ENDOSCOPY;  Service: Gastroenterology;;  . TEE WITHOUT CARDIOVERSION N/A 11/14/2014   Procedure: TRANSESOPHAGEAL ECHOCARDIOGRAM (TEE);  Surgeon: [name], MD;  Location: [place];  Service: Open Heart Surgery;  Laterality: N/A;  . TEE WITHOUT CARDIOVERSION N/A 12/25/2017   Procedure: TRANSESOPHAGEAL ECHOCARDIOGRAM (TEE);  Surgeon: [name], MD;  Location: [facility] ENDOSCOPY;  Service: Cardiovascular;  Laterality: N/A;  . TEMPORARY PACEMAKER INSERTION  11/04/2014   Procedure: TEMPORARY PACEMAKER INSERTION;  Surgeon: Troy Sine, MD;  Location: [facility] CATH LAB;  Service: Cardiovascular;;  . TOOTH EXTRACTION       Current Outpatient Medications  Medication Sig Dispense Refill  . acetaminophen (TYLENOL) 500 MG tablet Take 500 mg by mouth every 6 (six) hours as needed for moderate pain.     Marland Kitchen allopurinol (ZYLOPRIM) 300 MG tablet Take 1 tablet (300 mg total) by mouth daily. 30 tablet 11  . atorvastatin (LIPITOR) 10 MG tablet Take 10 mg by mouth daily.    . ferrous sulfate 325 (65 FE) MG EC  tablet Take 1 tablet (325 mg total) by mouth daily with breakfast. 30 tablet 0  . furosemide (LASIX) 20 MG tablet Take 1 tablet (20 mg total) by mouth daily. 30 tablet 1  . losartan (COZAAR) 25 MG tablet Take 1 tablet (25 mg total) by mouth 2 (two) times daily. 180 tablet 1  . metoprolol succinate (TOPROL XL) 50 MG 24 hr tablet Take 1 tablet (50 mg total) by mouth daily. Take with or immediately following a meal. 30 tablet 1  . pantoprazole (PROTONIX) 40 MG tablet Take 1 tablet (40 mg total) by mouth 2 (two) times daily. 60 tablet 1  . rivaroxaban (XARELTO) 20 MG TABS tablet Take 1 tablet (20 mg total) by mouth daily with supper. Resume from 11/01/2019    . senna-docusate (SENOKOT-S) 8.6-50 MG tablet Take 1 tablet by mouth 2 (two) times daily. 20  tablet 0  . triamcinolone ointment (KENALOG) 0.5 % APPLY TO THE AFFECTED AREA(S) TOPICALLY TWICE DAILY     No current facility-administered medications for this visit.    Allergies:   Bee venom, Ivp dye [iodinated diagnostic agents], Codeine, Entresto [sacubitril-valsartan], and Shrimp [shellfish allergy]    Social History:  The patient  reports that she quit smoking about 2 years ago. Her smoking use included cigarettes. She has a 5.60 pack-year smoking history. She has never used smokeless tobacco. She reports that she does not drink alcohol or use drugs.   Family History:  The patient'sfamily history includes CVA in her sister; Cancer in her mother; Diabetes in her brother; Heart disease in her father; Other in her father; Prostate cancer (age of onset: 58) in her brother.    ROS:  Please see the history of present illness.   Otherwise, review of systems are positive for none.   All other systems are reviewed and negative.    PHYSICAL EXAM: VS:  BP 140/72   Pulse 70   Ht 5\' 7"  (1.702 m)   Wt 226 lb 9.6 oz (102.8 kg)   SpO2 97%   BMI 35.49 kg/m  , BMI Body mass index is 35.49 kg/m.   General: Well developed, well nourished, NAD Neck: Negative for carotid bruits. No JVD Lungs:Clear to ausculation bilaterally. No wheezes, rales, or rhonchi. Breathing is unlabored. Cardiovascular: RRR with S1 S2.  Extremities: No edema. Radial pulses 2+ bilaterally Neuro: Alert and oriented. No focal deficits. No facial asymmetry. MAE spontaneously. Psych: Responds to questions appropriately with normal affect.    EKG:  EKG is not ordered today.   Recent Labs: 10/26/2019: ALT 9 10/29/2019: BUN 14; Creatinine, Ser 1.32; Hemoglobin 7.3; Magnesium 1.9; Platelets 211; Potassium 4.0; Sodium 141    Lipid Panel    Component Value Date/Time   CHOL 116 11/12/2014 0432   TRIG 111 11/12/2014 0432   HDL 34 (L) 11/12/2014 0432   CHOLHDL 3.4 11/12/2014 0432   VLDL 22 11/12/2014 0432   LDLCALC 60  11/12/2014 0432    Wt Readings from Last 3 Encounters:  12/02/19 226 lb 9.6 oz (102.8 kg)  12/02/19 227 lb 3.2 oz (103.1 kg)  10/28/19 223 lb 1.7 oz (101.2 kg)    Other studies Reviewed: Additional studies/ records that were reviewed today include:   Echocardiogram 10/10/2019: Impressions: 1. Left ventricular ejection fraction, by estimation, is 60 to 65%. The  left ventricle has normal function. The left ventricle has no regional  wall motion abnormalities. Left ventricular diastolic parameters are  indeterminate. Elevated left ventricular  end-diastolic pressure.  2. Right  ventricular systolic function is normal. The right ventricular  size is normal. Tricuspid regurgitation signal is inadequate for assessing  PA pressure.  3. The mitral valve is normal in structure and function. No evidence of  mitral valve regurgitation. No evidence of mitral stenosis.  4. The aortic valve has an indeterminant number of cusps. Aortic valve  regurgitation is not visualized. No aortic stenosis is present.  5. The inferior vena cava is normal in size with greater than 50%  respiratory variability, suggesting right atrial pressure of 3 mmHg.  Echcocardiogram 08/03/2018: Study Conclusions: - Left ventricle: The cavity size was mildly dilated. Systolic  function was moderately to severely reduced. The estimated  ejection fraction was in the range of 30% to 35%. Wall motion was  normal; there were no regional wall motion abnormalities. Doppler  parameters are consistent with abnormal left ventricular  relaxation (grade 1 diastolic dysfunction).  - Aortic valve: Transvalvular velocity was within the normal range.  There was no stenosis. There was trivial regurgitation.  - Mitral valve: There was mild regurgitation.  - Left atrium: The atrium was severely dilated.  - Right ventricle: The cavity size was mildly dilated. Wall  thickness was normal. Systolic function was moderately to   severely reduced. RV systolic pressure (S, est): 28 mm Hg.  - Right atrium: The atrium was moderately dilated.  - Inferior vena cava: The vessel was normal in size. The  respirophasic diameter changes were blunted (< 50%).  - Pericardium, extracardiac: There was no pericardial effusion.  _______________  Carlton Adam Myoview 09/07/2018:  Nuclear stress EF: 44%.  There was no ST segment deviation noted during stress.  The study is normal.  This is a low risk study.  The left ventricular ejection fraction is moderately decreased (30-44%).  ASSESSMENT AND PLAN:  1. Atrial Fibrillation with RVR: -Patient was last seen by general cardiology after presenting  with atrial fibrillation with RVR in setting of GI bleed. Rates initially as high as the 170's to 180's but are now well controlled on Cardizem drip>>>converted back to sinus rhythm on 2/24.  -Echo showed improved EF of 60-65% with normal wall motion, indeterminate diastolic parameters, and elevated LVEDP. LVEF improved from 30-35% in 07/2018.  -Plan is to continue Toprol XL -Continue Xarelto -CHA2DS2-VASc = 5 (CHF, HTN, age >29, CAD, female).   2. CAD s/p CABG in 2016:  -History of CABG x2 (LIMA to LAD and SVG to OM) in 2016. Low risk Myoview in 08/2018. -Denies anginal symptoms -Denies anginal symptoms -Continue BB, statin -No ASA in the setting of Xarelto  3. Chronic Combined CHF/Ishcemic Cardiomyopathy: -Most recent echocardiogram with LV improvement at 60-65% up from 30-35% in 07/2018.  -Euvolemic on exam>> continue Lasix -Continue Toprol, Losartan, and Spirnolactone   4. Acute anemia with GI bleed: -Hemoglobin at discharge, 7.3  -We will recheck CBC today  -Has follow-up with GI  -Continue Xarelto per GI during hospital course  5. Hypertension -Stable, 140/72 -Continue current Regimen  6. Hyperlipidemia -Continue home Lipitor 10mg  daily.  7. Acute on Chronic CKD Stage III -Creatinine 1.32 on  10/29/19>>repeat labs today     Current medicines are reviewed at length with the patient today.  The patient does not have concerns regarding medicines.  The following changes have been made:  Add spironolactone back to regimen   Labs/ tests ordered today include: CBC No orders of the defined types were placed in this encounter.   isposition:   FU with [name] in 3 months  Signed, Kathyrn Drown, NP  12/02/2019 4:17 PM    [facility] [place], [place], [place]  [address] Phone: [phone]; Fax: [phone]

## 2019-11-26 DIAGNOSIS — N183 Chronic kidney disease, stage 3 unspecified: Secondary | ICD-10-CM | POA: Diagnosis not present

## 2019-11-26 DIAGNOSIS — I10 Essential (primary) hypertension: Secondary | ICD-10-CM | POA: Diagnosis not present

## 2019-11-26 DIAGNOSIS — I5020 Unspecified systolic (congestive) heart failure: Secondary | ICD-10-CM | POA: Diagnosis not present

## 2019-11-26 DIAGNOSIS — E7800 Pure hypercholesterolemia, unspecified: Secondary | ICD-10-CM | POA: Diagnosis not present

## 2019-11-26 DIAGNOSIS — M1990 Unspecified osteoarthritis, unspecified site: Secondary | ICD-10-CM | POA: Diagnosis not present

## 2019-11-26 DIAGNOSIS — I4891 Unspecified atrial fibrillation: Secondary | ICD-10-CM | POA: Diagnosis not present

## 2019-11-26 DIAGNOSIS — J449 Chronic obstructive pulmonary disease, unspecified: Secondary | ICD-10-CM | POA: Diagnosis not present

## 2019-11-26 DIAGNOSIS — M8580 Other specified disorders of bone density and structure, unspecified site: Secondary | ICD-10-CM | POA: Diagnosis not present

## 2019-11-26 DIAGNOSIS — I25810 Atherosclerosis of coronary artery bypass graft(s) without angina pectoris: Secondary | ICD-10-CM | POA: Diagnosis not present

## 2019-11-26 DIAGNOSIS — I214 Non-ST elevation (NSTEMI) myocardial infarction: Secondary | ICD-10-CM | POA: Diagnosis not present

## 2019-11-26 DIAGNOSIS — I509 Heart failure, unspecified: Secondary | ICD-10-CM | POA: Diagnosis not present

## 2019-12-02 ENCOUNTER — Ambulatory Visit: Payer: HMO | Admitting: Cardiology

## 2019-12-02 ENCOUNTER — Ambulatory Visit (HOSPITAL_COMMUNITY)
Admission: RE | Admit: 2019-12-02 | Discharge: 2019-12-02 | Disposition: A | Discharge status: Home / Self Care | Payer: HMO | Source: Ambulatory Visit | Attending: Adult Health | Admitting: Adult Health

## 2019-12-02 ENCOUNTER — Other Ambulatory Visit: Payer: Self-pay

## 2019-12-02 ENCOUNTER — Encounter: Payer: Self-pay | Admitting: Cardiology

## 2019-12-02 ENCOUNTER — Encounter (HOSPITAL_COMMUNITY): Payer: Self-pay | Admitting: Internal Medicine

## 2019-12-02 ENCOUNTER — Ambulatory Visit (HOSPITAL_BASED_OUTPATIENT_CLINIC_OR_DEPARTMENT_OTHER)
Admission: RE | Admit: 2019-12-02 | Discharge: 2019-12-02 | Disposition: A | Discharge status: Home / Self Care | Payer: HMO | Source: Ambulatory Visit | Attending: Internal Medicine | Admitting: Internal Medicine

## 2019-12-02 VITALS — BP 140/72 | HR 70 | Ht 67.0 in | Wt 226.6 lb

## 2019-12-02 VITALS — BP 136/80 | HR 72 | Wt 227.2 lb

## 2019-12-02 DIAGNOSIS — I5032 Chronic diastolic (congestive) heart failure: Secondary | ICD-10-CM | POA: Diagnosis not present

## 2019-12-02 DIAGNOSIS — I480 Paroxysmal atrial fibrillation: Secondary | ICD-10-CM

## 2019-12-02 DIAGNOSIS — I2510 Atherosclerotic heart disease of native coronary artery without angina pectoris: Secondary | ICD-10-CM

## 2019-12-02 DIAGNOSIS — Z951 Presence of aortocoronary bypass graft: Secondary | ICD-10-CM | POA: Diagnosis not present

## 2019-12-02 DIAGNOSIS — I5042 Chronic combined systolic (congestive) and diastolic (congestive) heart failure: Secondary | ICD-10-CM | POA: Insufficient documentation

## 2019-12-02 DIAGNOSIS — I255 Ischemic cardiomyopathy: Secondary | ICD-10-CM | POA: Diagnosis not present

## 2019-12-02 DIAGNOSIS — R7303 Prediabetes: Secondary | ICD-10-CM | POA: Diagnosis not present

## 2019-12-02 DIAGNOSIS — I10 Essential (primary) hypertension: Secondary | ICD-10-CM | POA: Diagnosis not present

## 2019-12-02 DIAGNOSIS — Z8249 Family history of ischemic heart disease and other diseases of the circulatory system: Secondary | ICD-10-CM | POA: Diagnosis not present

## 2019-12-02 DIAGNOSIS — J449 Chronic obstructive pulmonary disease, unspecified: Secondary | ICD-10-CM | POA: Diagnosis not present

## 2019-12-02 DIAGNOSIS — K219 Gastro-esophageal reflux disease without esophagitis: Secondary | ICD-10-CM | POA: Insufficient documentation

## 2019-12-02 DIAGNOSIS — I25810 Atherosclerosis of coronary artery bypass graft(s) without angina pectoris: Secondary | ICD-10-CM | POA: Diagnosis not present

## 2019-12-02 DIAGNOSIS — R0989 Other specified symptoms and signs involving the circulatory and respiratory systems: Secondary | ICD-10-CM | POA: Diagnosis not present

## 2019-12-02 DIAGNOSIS — Z87891 Personal history of nicotine dependence: Secondary | ICD-10-CM | POA: Insufficient documentation

## 2019-12-02 DIAGNOSIS — E785 Hyperlipidemia, unspecified: Secondary | ICD-10-CM | POA: Insufficient documentation

## 2019-12-02 DIAGNOSIS — I252 Old myocardial infarction: Secondary | ICD-10-CM | POA: Insufficient documentation

## 2019-12-02 DIAGNOSIS — Z885 Allergy status to narcotic agent status: Secondary | ICD-10-CM | POA: Insufficient documentation

## 2019-12-02 DIAGNOSIS — Z7901 Long term (current) use of anticoagulants: Secondary | ICD-10-CM | POA: Insufficient documentation

## 2019-12-02 DIAGNOSIS — I130 Hypertensive heart and chronic kidney disease with heart failure and stage 1 through stage 4 chronic kidney disease, or unspecified chronic kidney disease: Secondary | ICD-10-CM | POA: Diagnosis not present

## 2019-12-02 DIAGNOSIS — I5022 Chronic systolic (congestive) heart failure: Secondary | ICD-10-CM

## 2019-12-02 DIAGNOSIS — Z833 Family history of diabetes mellitus: Secondary | ICD-10-CM | POA: Insufficient documentation

## 2019-12-02 DIAGNOSIS — K922 Gastrointestinal hemorrhage, unspecified: Secondary | ICD-10-CM | POA: Diagnosis not present

## 2019-12-02 DIAGNOSIS — Z8674 Personal history of sudden cardiac arrest: Secondary | ICD-10-CM | POA: Diagnosis not present

## 2019-12-02 DIAGNOSIS — N183 Chronic kidney disease, stage 3 unspecified: Secondary | ICD-10-CM | POA: Insufficient documentation

## 2019-12-02 DIAGNOSIS — Z79899 Other long term (current) drug therapy: Secondary | ICD-10-CM | POA: Diagnosis not present

## 2019-12-02 DIAGNOSIS — I080 Rheumatic disorders of both mitral and aortic valves: Secondary | ICD-10-CM | POA: Insufficient documentation

## 2019-12-02 DIAGNOSIS — M109 Gout, unspecified: Secondary | ICD-10-CM | POA: Insufficient documentation

## 2019-12-02 DIAGNOSIS — D509 Iron deficiency anemia, unspecified: Secondary | ICD-10-CM | POA: Diagnosis not present

## 2019-12-02 MED ORDER — SPIRONOLACTONE 25 MG PO TABS: 25.0000 mg | ORAL_TABLET | Freq: Every day | ORAL | 3 refills | Status: DC

## 2019-12-02 NOTE — Patient Instructions (Signed)
Your physician has requested that you have a carotid duplex. This test is an ultrasound of the carotid arteries in your neck. It looks at blood flow through these arteries that supply the brain with blood. Allow one hour for this exam. There are no restrictions or special instructions.  [place]!!!!!

## 2019-12-02 NOTE — Progress Notes (Signed)
  Echocardiogram 2D Echocardiogram has been performed.  [name] [name] 12/02/2019, 1:42 PM

## 2019-12-02 NOTE — Patient Instructions (Addendum)
Medication Instructions:   Your physician recommends that you continue on your current medications as directed. Please refer to the Current Medication list given to you today.  *If you need a refill on your cardiac medications before your next appointment, please call your pharmacy*  Lab Work:  You will have labs drawn today: CBC  If you have labs (blood work) drawn today and your tests are completely normal, you will receive your results only by: Marland Kitchen MyChart Message (if you have MyChart) OR . A paper copy in the mail If you have any lab test that is abnormal or we need to change your treatment, we will call you to review the results.  Testing/Procedures:  None ordered today  Follow-Up: At [facility], you and your health needs are our priority.  As part of our continuing mission to provide you with exceptional heart care, we have created designated Provider Care Teams.  These Care Teams include your primary Cardiologist (physician) and Advanced Practice Providers (APPs -  Physician Assistants and Nurse Practitioners) who all work together to provide you with the care you need, when you need it.  We recommend signing up for the patient portal called "MyChart".  Sign up information is provided on this After Visit Summary.  MyChart is used to connect with patients for Virtual Visits (Telemedicine).  Patients are able to view lab/test results, encounter notes, upcoming appointments, etc.  Non-urgent messages can be sent to your provider as well.   To learn more about what you can do with MyChart, go to [email or web address].    Your next appointment:    On 03/09/20 at 2:40PM with [name], MD

## 2019-12-02 NOTE — Progress Notes (Signed)
`  Advanced [facility]  Note   PCP: [name], MD PCP-Cardiologist: [name], MD  HF: Dr Haroldine Laws   HPI: [name] [name] is a 74 y.o. female with h/o HTN, former tobacco abuse, PAF, CAD s/p CABG x 2 (LIMA to LAD and SVG to OM) in 2016, Chronic combined CHF, Ischemic CMP, HLD, and DOE.   She was initially noted to be in atrial fibrillation in 2016 with a reduced LVEF, leading to ischemic workup with a cardiac cath showing severe multivessel CAD with LM involvement in which she underwent CABG x2 utilizing LIMA to LAD, SVG to OM.   Admitted 1/19 for AF with RVR in setting of Influenza A. During that hospitalization, she was started on diltiazem drip, EP was consulted to discuss plans for Tikosyn vs. ablation vs. medication rate control however, due to her known medication noncompliance she was found to not be an ablation nor antiarrhythmic candidate  Pt admitted 3/3 - 10/20/17 with recurrent Afib RVR and ADHF. Started on diltiazem. Echo repeated which showed fall in EF from previous. Diuresed with IV lasix with 5L negative for admission. Discharged home on diltiazem 360, Torpol 100, digoxin and Xarelto.  Seen by Dr. Debara Pickett 11/20/17. She was found to be in Afib. She was started on amiodarone and diltiazem was DCd. She was also started on Entresto. She underwent successful atrial flutter TEE/DCCV on 12/25/17. She was back in rate controlled afib on 5/16 during paramedic visit.Toprol DC'd with ongoing bradycardia. She later placed on low dose carvedilol.   Intolerant entresto due to chest pain.   Admitted 3/21 for iron-def anemia. Hemoglobin was 5.4. She was started on IV Protonix. GI was consulted.  She underwent 2 units packed red cells transfusion.  She underwent EGD/colonoscopy which were unremarkable and capsule endoscopy which showed duodenal ulcer with bleeding and stigmata.  GI recommended oral Protonix and outpatient follow-up.  Today she returns for HF follow up. Says she is  feeling good. Denies CP or SOB. No further GI bleeding. Says lasix was recently decreased because she was feeling weak. Denies orthopnea or PND.   Echo today 12/02/19 EF 60% possible mild HK of posterior wall. Personally reviewed   Cardiac studies:  Echo in 3/16 EF 35-40%. Echo in 10/17 EF improved to 50-55%.  Echo 5/18 EF stable 50-55% Echo 10/19/17 LVEF 30-35%, Moderate LVH, Mild AI, Moderate regurgitation, Severe LAE, Moderately reduced RV, Severe RAE, PA peak pressure 50 mm Hg.   (Decreased from Echo 01/11/2017 with LVEF 50-55%) Echo 08/03/2018 Ef 30-35% RV mildly dilated.   Stress Test 1/20- Low Risk EF 44%  Review of systems complete and found to be negative unless listed in HPI.   Past Medical History:  Diagnosis Date  . Arthritis   . Asthma    ??  . Atopic dermatitis   . Cardiac arrest ([place]) 10/2014   [name]  . Cardiomyopathy, ischemic    a. EF previously low, improved to LVEF 50-55% as of May 2018  . Chronic combined systolic and diastolic heart failure ([place])   . CKD (chronic kidney disease), stage III   . COPD (chronic obstructive pulmonary disease) ([place])   . Esophageal dilatation 2013  . GERD (gastroesophageal reflux disease)   . Gout   . Headache   . Hypertension   . Hypokalemia   . Idiopathic angioedema   . LGI bleed 08/06/2017   a. felt to be hemorrhoidal during that admission (no drop in Hgb).  . Lower back pain   .  Persistent atrial fibrillation ([facility])    a. h/o difficult to control rates (complicated by noncompliance), not felt to be a candidate for ablation or antiarrhythmic due to noncompliance.  . Personal history of noncompliance with medical treatment, presenting hazards to health   . Prediabetes   . S/P CABG x 2 with clipping of LA appendage 11/14/2014   LIMA to LAD, SVG to OM, EVH via right thigh  . Urine incontinence   . Uterine fibroid    Current Outpatient Medications  Medication Sig Dispense Refill  . acetaminophen (TYLENOL) 500 MG tablet Take  500 mg by mouth every 6 (six) hours as needed for moderate pain.     Marland Kitchen allopurinol (ZYLOPRIM) 300 MG tablet Take 1 tablet (300 mg total) by mouth daily. 30 tablet 11  . atorvastatin (LIPITOR) 10 MG tablet Take 10 mg by mouth daily.    . ferrous sulfate 325 (65 FE) MG EC tablet Take 1 tablet (325 mg total) by mouth daily with breakfast. 30 tablet 0  . furosemide (LASIX) 20 MG tablet Take 1 tablet (20 mg total) by mouth daily. 30 tablet 1  . losartan (COZAAR) 25 MG tablet Take 1 tablet (25 mg total) by mouth 2 (two) times daily. 180 tablet 1  . metoprolol succinate (TOPROL XL) 50 MG 24 hr tablet Take 1 tablet (50 mg total) by mouth daily. Take with or immediately following a meal. 30 tablet 1  . ondansetron (ZOFRAN) 4 MG tablet Take 1 tablet (4 mg total) by mouth every 6 (six) hours as needed for nausea. 20 tablet 0  . pantoprazole (PROTONIX) 40 MG tablet Take 1 tablet (40 mg total) by mouth 2 (two) times daily. 60 tablet 1  . rivaroxaban (XARELTO) 20 MG TABS tablet Take 1 tablet (20 mg total) by mouth daily with supper. Resume from 11/01/2019    . senna-docusate (SENOKOT-S) 8.6-50 MG tablet Take 1 tablet by mouth 2 (two) times daily. 20 tablet 0  . triamcinolone ointment (KENALOG) 0.5 % APPLY TO THE AFFECTED AREA(S) TOPICALLY TWICE DAILY     No current facility-administered medications for this encounter.   Allergies  Allergen Reactions  . Bee Venom Anaphylaxis  . Ivp Dye [Iodinated Diagnostic Agents] Anaphylaxis  . Codeine Nausea And Vomiting  . Entresto [Sacubitril-Valsartan]     Chest pain   . Shrimp [Shellfish Allergy] Swelling   Social History   Socioeconomic History  . Marital status: Widowed    Spouse name: Not on file  . Number of children: 1  . Years of education: 15  . Highest education level: Not on file  Occupational History  . Occupation: RETIRED [employer]  Tobacco Use  . Smoking status: Former Smoker    Packs/day: 0.10    Years: 56.00    Pack years: 5.60      Types: Cigarettes    Quit date: 2019    Years since quitting: 2.2  . Smokeless tobacco: Never Used  Substance and Sexual Activity  . Alcohol use: No    Alcohol/week: 0.0 standard drinks  . Drug use: No  . Sexual activity: Not on file  Other Topics Concern  . Not on file  Social History Narrative   Patient reports it being difficult to pay for everything due to outstanding medical bills from hospital stay last year but states she is able to keep up with basic expenses.  Patient does have some concerns with her house's condition due to a water leak- had her roof repaired last  month but has some leaking in the front which has affected her porch.  Patient owns her own car and is able to drive herself but has some concerns about the reliability of her car- has gotten taxi to the clinic for appointment in the past when her car broke down.   Social Determinants of Health   Financial Resource Strain:   . Difficulty of Paying Living Expenses:   Food Insecurity:   . Worried About Charity fundraiser in the Last Year:   . Arboriculturist in the Last Year:   Transportation Needs:   . Film/video editor (Medical):   Marland Kitchen Lack of Transportation (Non-Medical):   Physical Activity:   . Days of Exercise per Week:   . Minutes of Exercise per Session:   Stress:   . Feeling of Stress :   Social Connections:   . Frequency of Communication with Friends and Family:   . Frequency of Social Gatherings with Friends and Family:   . Attends Religious Services:   . Active Member of Clubs or Organizations:   . Attends Archivist Meetings:   Marland Kitchen Marital Status:   Intimate Partner Violence:   . Fear of Current or Ex-Partner:   . Emotionally Abused:   Marland Kitchen Physically Abused:   . Sexually Abused:    Family History  Problem Relation Age of Onset  . Cancer Mother        [place]MPH[place]  . Heart disease Father   . Other Father        TB  . CVA Sister   . Prostate cancer Brother 4  . Diabetes  Brother    Vitals:   12/02/19 1355  BP: 136/80  Pulse: 72  SpO2: 100%  Weight: 103.1 kg (227 lb 3.2 oz)   Wt Readings from Last 3 Encounters:  12/02/19 103.1 kg (227 lb 3.2 oz)  10/28/19 101.2 kg (223 lb 1.7 oz)  10/10/19 103.6 kg (228 lb 6.4 oz)    PHYSICAL EXAM: General:  Obese woman. No resp difficulty HEENT: normal Neck: supple. no JVD. Carotids 2+ bilat;  ? Faint left carotid bruit . No lymphadenopathy or thryomegaly appreciated. Cor: PMI nondisplaced. Regular rate & rhythm. No rubs, gallops or murmurs. Lungs: clear Abdomen: obese soft, nontender, nondistended. No hepatosplenomegaly. No bruits or masses. Good bowel sounds. Extremities: no cyanosis, clubbing, rash, edema Neuro: alert & orientedx3, cranial nerves grossly intact. moves all 4 extremities w/o difficulty. Affect pleasant   EKG: NSR 70 TWI I, aVL. Personally reviewed   ASSESSMENT & PLAN:  1. Chronic combined systolic/diastolic CHF - Suspect etiology is combined Ischemic/NICM with AF - Echo 10/19/17 LVEF 30-35%, Moderate LVH, Mild AI, Moderate regurgitation, Severe LAE, Moderately reduced RV, Severe RAE, PA peak pressure 50 mm Hg. (Decreased from Echo 01/11/2017 with LVEF 50-55%) - TEE 12/2017: EF 20-25%, RV mod to severely reduced - Echo today (11/27/19) with recovered EF 60% RV ok.  (improved with maintenance of NSR) - Personally reviewed - Stable NYHA II.  - NYHA II. Volume status looks good. Continue lasix 20 daily.  - Continue Toprol XL 50 mg daily  - Continue losartan 25 mg bid. Intolerant entresto due to chest pain.  - Continue spironolactone 25 mg daily.  2. CAD s/p CABG x 2 2016 (LIMA to LAD and SVG to OM) - Stress Test 08/2018 low risk. EF 44%.   - No s/s angina  - Continue statin. No ASA with Xarelto 3. PAF s/p TEE/DCCV on 12/25/17 -  Remains in NSR  - CHA2DS2-VASc is at least 5.  - Continue xarelto. Recent bleeding resolved 4. HTN -Stable.  5. Tobacco Abuse - Says she is no longer smoking  6.  Suspected OSA/Snoring - She had a sleep study in May 2019 but has not followed up for next test. Have encouraged her to f/u as I suspect she has significant OSA 7. Iron-deficiency anemia - GI w/u in 3/21 with duodenal ulcer - on PPI 8. ? Left carotid bruit - get u/s   She is doing well from HF perspective. EF has recovered. Will turn he back over to [facility] for ongoing f/u. Can come back to HF Clinic as needed.   [name], MD 12/02/19

## 2019-12-03 ENCOUNTER — Other Ambulatory Visit: Payer: Self-pay

## 2019-12-03 LAB — CBC
Hematocrit: 35.3 % (ref 34.0–46.6)
Hemoglobin: 11 g/dL — ABNORMAL LOW (ref 11.1–15.9)
MCH: 25.5 pg — ABNORMAL LOW (ref 26.6–33.0)
MCHC: 31.2 g/dL — ABNORMAL LOW (ref 31.5–35.7)
MCV: 82 fL (ref 79–97)
Platelets: 201 10*3/uL (ref 150–450)
RBC: 4.32 x10E6/uL (ref 3.77–5.28)
RDW: 17 % — ABNORMAL HIGH (ref 11.7–15.4)
WBC: 8.3 10*3/uL (ref 3.4–10.8)

## 2019-12-03 NOTE — Patient Outreach (Signed)
  [facility]) Care Management Chronic Special Needs Program  12/03/2019  Name: [name] [name] DOB: 1946-02-02  MRN: [id number]  [name] is enrolled in a chronic special needs plan for Heart Failure. RNCM called to follow up with client one month post hospitalization.   Subjective: Client reports, "so far I am doing ok". Client reports saw heart failure specialist on 12/02/18. She states she is doing good and does not have to follow up with HF specialist any more, but has been transitioned back to her cardiologist. [name] states she was seen at cardiologist office on yesterday also. [name] reports she was also seen by gastroenterologist on yesterday. She denies any GI signs/symtoms that brought her into the hospital the last two hospitalizations. She denies any issues with medications. Client with no questions or concerns at this time.  Goals Addressed            This Visit's Progress   . COMPLETED: Client understands the importance of follow-up with providers by attending scheduled visits       Voiced understanding of attending provider visits. Followed up with primary care, cardiologist and HF specialist and gastroenterologist.    . Client verbalize knowledge of Heart Failure disease self management skills within the next 6-9 months.   On track    Client reports she is taking medications as prescribed and following up with providers as scheduled.    . Client will report weighing and recording weights at least three times/week within the next 6-9 months.   On track    Discussed the importance of daily weights and parameters for what to look for and when to call the provider based on weight gain 3 pounds overnight or 5 pounds in a week.      . COMPLETED: Client will verbalize self-management skills related to Atrial Fibrillation within the next 6-9 months.       Client reports she is taking medications as prescribed and following up with provider. Client  reports she is not in atrial fibrillation at this time.    . COMPLETED: General - Client will not be readmitted within 30 days (C-SNP) discharge date revised 10/29/2019       No readmission.      . Visit Primary Care Provider or Cardiologist at least 2 times per year       Last Health risk assessment completed 01/29/2019:  Cardiologist seen 06/11/2019 and 08/27/2019 appointment completed with HF clinic on 12/02/2019 appointment completed with cardiologist 12/02/2019.      RNCM reinforced the role of CSNP care coordinator and encouraged client to call RNCM as needed.  Plan: RNCM will send updated care plan to client; send updated care plan to primary care provider. RNCM will outreach within the next 6 months.    Thea Silversmith, RN, MSN, [place] [phone]

## 2019-12-04 ENCOUNTER — Other Ambulatory Visit: Payer: Self-pay

## 2019-12-04 NOTE — Patient Outreach (Signed)
  [facility]) Care Management Chronic Special Needs Program   12/04/2019  Name: Aizley, Fracassi: [birth year]  MRN: [id number]  The client was discussed in today's interdisciplinary care team meeting.  The following issues were discussed:  Client's needs, Changes in health status, Key risk triggers/risk stratification, Care Plan, Coordination of care and Care transitions  Participants present:     Bary Castilla, RN, BSN, MS, CCM  [name], BSN, CCM  [employer], BSN, CCM, CDE  Thea Silversmith, BSN, MSN, CCM  Jacqlyn Larsen, RN, BSN, CCM  [facility], [facility]/ CMAA  Dr. Maryella Shivers   Dr. Coralie Carpen   Roney Mans, [place], [place] ([facility])  Karrie Meres, Pharm D ([facility])  Babs Bertin, RN ([facility])  Teresa Pelton, RN ([facility])    Plan: [facility] to continue to attempt to engage client. RNCM will continue to follow as scheduled.   Thea Silversmith, RN, MSN, [place] [phone]

## 2019-12-05 DIAGNOSIS — D649 Anemia, unspecified: Secondary | ICD-10-CM | POA: Diagnosis not present

## 2019-12-11 ENCOUNTER — Other Ambulatory Visit: Payer: Self-pay

## 2019-12-11 ENCOUNTER — Ambulatory Visit (HOSPITAL_COMMUNITY)
Admission: RE | Admit: 2019-12-11 | Discharge: 2019-12-11 | Disposition: A | Discharge status: Home / Self Care | Payer: HMO | Source: Ambulatory Visit | Attending: Internal Medicine | Admitting: Internal Medicine

## 2019-12-11 DIAGNOSIS — R0989 Other specified symptoms and signs involving the circulatory and respiratory systems: Secondary | ICD-10-CM | POA: Diagnosis not present

## 2019-12-12 ENCOUNTER — Other Ambulatory Visit (HOSPITAL_COMMUNITY): Payer: Self-pay | Admitting: Adult Health

## 2019-12-18 ENCOUNTER — Telehealth (HOSPITAL_COMMUNITY): Payer: Self-pay | Admitting: *Deleted

## 2019-12-18 NOTE — Telephone Encounter (Signed)
[name] [name], [place]  12/18/2019 9:19 AM EDT    Left VM for pt to call for results.    Bensimhon, Shaune Pascal, MD  12/15/2019 11:56 AM EDT    Mild plaque. No further w/u currently   Bensimhon, Shaune Pascal, MD  12/12/2019 8:59 AM EDT    Mild plaque.    Carotid doppler results

## 2019-12-18 NOTE — Telephone Encounter (Signed)
-----   Message from [name], MD sent at 12/15/2019 11:56 AM EDT ----- Mild plaque. No further w/u currently

## 2019-12-19 ENCOUNTER — Telehealth (HOSPITAL_COMMUNITY): Payer: Self-pay | Admitting: *Deleted

## 2019-12-19 NOTE — Telephone Encounter (Signed)
Patient aware of results.    12/18/19 9:19 AM Note    Harvie Junior, CMA  12/18/2019 9:19 AM EDT    Left VM for pt to call for results.    Bensimhon, Shaune Pascal, MD  12/15/2019 11:56 AM EDT    Mild plaque. No further w/u currently   Bensimhon, Shaune Pascal, MD  12/12/2019 8:59 AM EDT    Mild plaque.    Carotid doppler results    Me     12/18/19 9:19 AM Note   ----- Message from [name], MD sent at 12/15/2019 11:56 AM EDT ----- Mild plaque. No further w/u currently

## 2019-12-24 DIAGNOSIS — N183 Chronic kidney disease, stage 3 unspecified: Secondary | ICD-10-CM | POA: Diagnosis not present

## 2019-12-24 DIAGNOSIS — I214 Non-ST elevation (NSTEMI) myocardial infarction: Secondary | ICD-10-CM | POA: Diagnosis not present

## 2019-12-24 DIAGNOSIS — J449 Chronic obstructive pulmonary disease, unspecified: Secondary | ICD-10-CM | POA: Diagnosis not present

## 2019-12-24 DIAGNOSIS — I4891 Unspecified atrial fibrillation: Secondary | ICD-10-CM | POA: Diagnosis not present

## 2019-12-24 DIAGNOSIS — I509 Heart failure, unspecified: Secondary | ICD-10-CM | POA: Diagnosis not present

## 2019-12-24 DIAGNOSIS — I10 Essential (primary) hypertension: Secondary | ICD-10-CM | POA: Diagnosis not present

## 2019-12-24 DIAGNOSIS — M8580 Other specified disorders of bone density and structure, unspecified site: Secondary | ICD-10-CM | POA: Diagnosis not present

## 2019-12-24 DIAGNOSIS — M1990 Unspecified osteoarthritis, unspecified site: Secondary | ICD-10-CM | POA: Diagnosis not present

## 2019-12-24 DIAGNOSIS — I25810 Atherosclerosis of coronary artery bypass graft(s) without angina pectoris: Secondary | ICD-10-CM | POA: Diagnosis not present

## 2019-12-24 DIAGNOSIS — I5020 Unspecified systolic (congestive) heart failure: Secondary | ICD-10-CM | POA: Diagnosis not present

## 2019-12-24 DIAGNOSIS — E7800 Pure hypercholesterolemia, unspecified: Secondary | ICD-10-CM | POA: Diagnosis not present

## 2020-01-02 DIAGNOSIS — K269 Duodenal ulcer, unspecified as acute or chronic, without hemorrhage or perforation: Secondary | ICD-10-CM | POA: Diagnosis not present

## 2020-01-02 DIAGNOSIS — Z86010 Personal history of colonic polyps: Secondary | ICD-10-CM | POA: Diagnosis not present

## 2020-01-02 DIAGNOSIS — K3189 Other diseases of stomach and duodenum: Secondary | ICD-10-CM | POA: Diagnosis not present

## 2020-01-02 DIAGNOSIS — D62 Acute posthemorrhagic anemia: Secondary | ICD-10-CM | POA: Diagnosis not present

## 2020-02-10 ENCOUNTER — Emergency Department (HOSPITAL_COMMUNITY): Payer: HMO

## 2020-02-10 ENCOUNTER — Observation Stay (HOSPITAL_COMMUNITY)
Admission: EM | Admit: 2020-02-10 | Discharge: 2020-02-11 | Disposition: A | Discharge status: Home / Self Care | Payer: HMO | Attending: Internal Medicine | Admitting: Internal Medicine

## 2020-02-10 ENCOUNTER — Encounter (HOSPITAL_COMMUNITY): Payer: Self-pay | Admitting: Internal Medicine

## 2020-02-10 ENCOUNTER — Other Ambulatory Visit: Payer: Self-pay

## 2020-02-10 DIAGNOSIS — I255 Ischemic cardiomyopathy: Secondary | ICD-10-CM | POA: Diagnosis not present

## 2020-02-10 DIAGNOSIS — Z6835 Body mass index (BMI) 35.0-35.9, adult: Secondary | ICD-10-CM | POA: Diagnosis not present

## 2020-02-10 DIAGNOSIS — Z7901 Long term (current) use of anticoagulants: Secondary | ICD-10-CM | POA: Diagnosis not present

## 2020-02-10 DIAGNOSIS — K625 Hemorrhage of anus and rectum: Secondary | ICD-10-CM

## 2020-02-10 DIAGNOSIS — J449 Chronic obstructive pulmonary disease, unspecified: Secondary | ICD-10-CM | POA: Diagnosis not present

## 2020-02-10 DIAGNOSIS — F17210 Nicotine dependence, cigarettes, uncomplicated: Secondary | ICD-10-CM | POA: Insufficient documentation

## 2020-02-10 DIAGNOSIS — I2510 Atherosclerotic heart disease of native coronary artery without angina pectoris: Secondary | ICD-10-CM | POA: Insufficient documentation

## 2020-02-10 DIAGNOSIS — R197 Diarrhea, unspecified: Secondary | ICD-10-CM | POA: Diagnosis not present

## 2020-02-10 DIAGNOSIS — K222 Esophageal obstruction: Secondary | ICD-10-CM | POA: Diagnosis not present

## 2020-02-10 DIAGNOSIS — I130 Hypertensive heart and chronic kidney disease with heart failure and stage 1 through stage 4 chronic kidney disease, or unspecified chronic kidney disease: Secondary | ICD-10-CM | POA: Diagnosis not present

## 2020-02-10 DIAGNOSIS — N183 Chronic kidney disease, stage 3 unspecified: Secondary | ICD-10-CM | POA: Insufficient documentation

## 2020-02-10 DIAGNOSIS — Z951 Presence of aortocoronary bypass graft: Secondary | ICD-10-CM | POA: Insufficient documentation

## 2020-02-10 DIAGNOSIS — K922 Gastrointestinal hemorrhage, unspecified: Secondary | ICD-10-CM | POA: Diagnosis not present

## 2020-02-10 DIAGNOSIS — I4892 Unspecified atrial flutter: Secondary | ICD-10-CM | POA: Diagnosis not present

## 2020-02-10 DIAGNOSIS — Z20822 Contact with and (suspected) exposure to covid-19: Secondary | ICD-10-CM | POA: Diagnosis not present

## 2020-02-10 DIAGNOSIS — K5731 Diverticulosis of large intestine without perforation or abscess with bleeding: Principal | ICD-10-CM | POA: Insufficient documentation

## 2020-02-10 DIAGNOSIS — K219 Gastro-esophageal reflux disease without esophagitis: Secondary | ICD-10-CM | POA: Diagnosis not present

## 2020-02-10 DIAGNOSIS — M1990 Unspecified osteoarthritis, unspecified site: Secondary | ICD-10-CM | POA: Insufficient documentation

## 2020-02-10 DIAGNOSIS — K648 Other hemorrhoids: Secondary | ICD-10-CM | POA: Diagnosis not present

## 2020-02-10 DIAGNOSIS — I10 Essential (primary) hypertension: Secondary | ICD-10-CM | POA: Diagnosis not present

## 2020-02-10 DIAGNOSIS — D259 Leiomyoma of uterus, unspecified: Secondary | ICD-10-CM | POA: Diagnosis not present

## 2020-02-10 DIAGNOSIS — I480 Paroxysmal atrial fibrillation: Secondary | ICD-10-CM | POA: Diagnosis present

## 2020-02-10 DIAGNOSIS — M109 Gout, unspecified: Secondary | ICD-10-CM | POA: Diagnosis not present

## 2020-02-10 DIAGNOSIS — I4430 Unspecified atrioventricular block: Secondary | ICD-10-CM | POA: Insufficient documentation

## 2020-02-10 DIAGNOSIS — F17200 Nicotine dependence, unspecified, uncomplicated: Secondary | ICD-10-CM | POA: Diagnosis present

## 2020-02-10 DIAGNOSIS — Z79899 Other long term (current) drug therapy: Secondary | ICD-10-CM | POA: Diagnosis not present

## 2020-02-10 DIAGNOSIS — R7303 Prediabetes: Secondary | ICD-10-CM | POA: Diagnosis not present

## 2020-02-10 DIAGNOSIS — I5042 Chronic combined systolic (congestive) and diastolic (congestive) heart failure: Secondary | ICD-10-CM | POA: Insufficient documentation

## 2020-02-10 DIAGNOSIS — E669 Obesity, unspecified: Secondary | ICD-10-CM | POA: Insufficient documentation

## 2020-02-10 DIAGNOSIS — I5023 Acute on chronic systolic (congestive) heart failure: Secondary | ICD-10-CM | POA: Diagnosis present

## 2020-02-10 DIAGNOSIS — Z8674 Personal history of sudden cardiac arrest: Secondary | ICD-10-CM | POA: Diagnosis not present

## 2020-02-10 DIAGNOSIS — I4891 Unspecified atrial fibrillation: Secondary | ICD-10-CM | POA: Diagnosis present

## 2020-02-10 DIAGNOSIS — M5126 Other intervertebral disc displacement, lumbar region: Secondary | ICD-10-CM | POA: Diagnosis not present

## 2020-02-10 DIAGNOSIS — I700 Atherosclerosis of aorta: Secondary | ICD-10-CM | POA: Insufficient documentation

## 2020-02-10 DIAGNOSIS — K449 Diaphragmatic hernia without obstruction or gangrene: Secondary | ICD-10-CM | POA: Diagnosis not present

## 2020-02-10 DIAGNOSIS — K921 Melena: Secondary | ICD-10-CM | POA: Diagnosis not present

## 2020-02-10 DIAGNOSIS — K5730 Diverticulosis of large intestine without perforation or abscess without bleeding: Secondary | ICD-10-CM | POA: Diagnosis not present

## 2020-02-10 DIAGNOSIS — I4819 Other persistent atrial fibrillation: Secondary | ICD-10-CM | POA: Diagnosis present

## 2020-02-10 DIAGNOSIS — E66811 Obesity, class 1: Secondary | ICD-10-CM | POA: Diagnosis present

## 2020-02-10 DIAGNOSIS — D649 Anemia, unspecified: Secondary | ICD-10-CM | POA: Diagnosis not present

## 2020-02-10 LAB — PROTIME-INR
INR: 2.5 — ABNORMAL HIGH (ref 0.8–1.2)
Prothrombin Time: 25.9 seconds — ABNORMAL HIGH (ref 11.4–15.2)

## 2020-02-10 LAB — COMPREHENSIVE METABOLIC PANEL
ALT: 11 U/L (ref 0–44)
AST: 13 U/L — ABNORMAL LOW (ref 15–41)
Albumin: 3.4 g/dL — ABNORMAL LOW (ref 3.5–5.0)
Alkaline Phosphatase: 136 U/L — ABNORMAL HIGH (ref 38–126)
Anion gap: 10 (ref 5–15)
BUN: 16 mg/dL (ref 8–23)
CO2: 19 mmol/L — ABNORMAL LOW (ref 22–32)
Calcium: 8.9 mg/dL (ref 8.9–10.3)
Chloride: 114 mmol/L — ABNORMAL HIGH (ref 98–111)
Creatinine, Ser: 1.13 mg/dL — ABNORMAL HIGH (ref 0.44–1.00)
GFR calc Af Amer: 55 mL/min — ABNORMAL LOW (ref >60)
GFR calc non Af Amer: 48 mL/min — ABNORMAL LOW (ref >60)
Glucose, Bld: 120 mg/dL — ABNORMAL HIGH (ref 70–99)
Potassium: 3.5 mmol/L (ref 3.5–5.1)
Sodium: 143 mmol/L (ref 135–145)
Total Bilirubin: 0.5 mg/dL (ref 0.3–1.2)
Total Protein: 6.9 g/dL (ref 6.5–8.1)

## 2020-02-10 LAB — CBC
HCT: 43.2 % (ref 36.0–46.0)
HCT: 42.3 % (ref 36.0–46.0)
Hemoglobin: 13 g/dL (ref 12.0–15.0)
Hemoglobin: 12.5 g/dL (ref 12.0–15.0)
MCH: 24 pg — ABNORMAL LOW (ref 26.0–34.0)
MCH: 24.2 pg — ABNORMAL LOW (ref 26.0–34.0)
MCHC: 30.1 g/dL (ref 30.0–36.0)
MCHC: 29.6 g/dL — ABNORMAL LOW (ref 30.0–36.0)
MCV: 79.7 fL — ABNORMAL LOW (ref 80.0–100.0)
MCV: 81.8 fL (ref 80.0–100.0)
Platelets: 165 10*3/uL (ref 150–400)
Platelets: 130 10*3/uL — ABNORMAL LOW (ref 150–400)
RBC: 5.42 MIL/uL — ABNORMAL HIGH (ref 3.87–5.11)
RBC: 5.17 MIL/uL — ABNORMAL HIGH (ref 3.87–5.11)
RDW: 18.1 % — ABNORMAL HIGH (ref 11.5–15.5)
RDW: 18.3 % — ABNORMAL HIGH (ref 11.5–15.5)
WBC: 10 10*3/uL (ref 4.0–10.5)
WBC: 7.9 10*3/uL (ref 4.0–10.5)
nRBC: 0 % (ref 0.0–0.2)
nRBC: 0 % (ref 0.0–0.2)

## 2020-02-10 LAB — TYPE AND SCREEN
ABO/RH(D): A POS
Antibody Screen: NEGATIVE

## 2020-02-10 LAB — SARS CORONAVIRUS 2 BY RT PCR (HOSPITAL ORDER, PERFORMED IN ~~LOC~~ HOSPITAL LAB): SARS Coronavirus 2: NEGATIVE

## 2020-02-10 LAB — POC OCCULT BLOOD, ED: Fecal Occult Bld: POSITIVE — AB

## 2020-02-10 MED ORDER — FENTANYL CITRATE (PF) 100 MCG/2ML IJ SOLN
50.0000 ug | Freq: Once | INTRAMUSCULAR | Status: AC
  Administered 2020-02-10: 50 ug via INTRAVENOUS
  Filled 2020-02-10: qty 2

## 2020-02-10 MED ORDER — ONDANSETRON HCL 4 MG/2ML IJ SOLN: 4.0000 mg | Freq: Four times a day (QID) | INTRAMUSCULAR | Status: DC | PRN

## 2020-02-10 MED ORDER — ONDANSETRON HCL 4 MG PO TABS: 4.0000 mg | ORAL_TABLET | Freq: Four times a day (QID) | ORAL | Status: DC | PRN

## 2020-02-10 MED ORDER — METOPROLOL SUCCINATE ER 50 MG PO TB24
50.0000 mg | ORAL_TABLET | Freq: Every day | ORAL | Status: DC
  Administered 2020-02-11: 50 mg via ORAL
  Filled 2020-02-10: qty 1
  Filled 2020-02-10: qty 2

## 2020-02-10 MED ORDER — ZOLPIDEM TARTRATE 5 MG PO TABS: 5.0000 mg | ORAL_TABLET | Freq: Every evening | ORAL | Status: DC | PRN

## 2020-02-10 MED ORDER — HYDRALAZINE HCL 20 MG/ML IJ SOLN: 5.0000 mg | INTRAMUSCULAR | Status: DC | PRN

## 2020-02-10 MED ORDER — ACETAMINOPHEN 650 MG RE SUPP: 650.0000 mg | Freq: Four times a day (QID) | RECTAL | Status: DC | PRN

## 2020-02-10 MED ORDER — ACETAMINOPHEN 325 MG PO TABS: 650.0000 mg | ORAL_TABLET | Freq: Four times a day (QID) | ORAL | Status: DC | PRN

## 2020-02-10 MED ORDER — ALLOPURINOL 300 MG PO TABS
300.0000 mg | ORAL_TABLET | Freq: Every day | ORAL | Status: DC
  Administered 2020-02-10 – 2020-02-11 (×2): 300 mg via ORAL
  Filled 2020-02-10 (×2): qty 1

## 2020-02-10 MED ORDER — LOSARTAN POTASSIUM 25 MG PO TABS
25.0000 mg | ORAL_TABLET | Freq: Two times a day (BID) | ORAL | Status: DC
  Administered 2020-02-10 – 2020-02-11 (×3): 25 mg via ORAL
  Filled 2020-02-10 (×3): qty 1

## 2020-02-10 MED ORDER — ATORVASTATIN CALCIUM 10 MG PO TABS
10.0000 mg | ORAL_TABLET | Freq: Every day | ORAL | Status: DC
  Administered 2020-02-10 – 2020-02-11 (×2): 10 mg via ORAL
  Filled 2020-02-10 (×2): qty 1

## 2020-02-10 MED ORDER — ONDANSETRON HCL 4 MG/2ML IJ SOLN
4.0000 mg | Freq: Once | INTRAMUSCULAR | Status: AC
  Administered 2020-02-10: 4 mg via INTRAVENOUS
  Filled 2020-02-10: qty 2

## 2020-02-10 MED ORDER — SODIUM CHLORIDE 0.9 % IV SOLN: INTRAVENOUS | Status: DC

## 2020-02-10 MED ORDER — HYDROCODONE-ACETAMINOPHEN 5-325 MG PO TABS
1.0000 | ORAL_TABLET | ORAL | Status: DC | PRN
  Administered 2020-02-10 (×2): 2 via ORAL
  Administered 2020-02-10: 1 via ORAL
  Filled 2020-02-10: qty 1
  Filled 2020-02-10 (×2): qty 2

## 2020-02-10 MED ORDER — RIVAROXABAN 20 MG PO TABS
20.0000 mg | ORAL_TABLET | Freq: Every day | ORAL | Status: DC
  Administered 2020-02-10: 20 mg via ORAL
  Filled 2020-02-10 (×2): qty 1

## 2020-02-10 MED ORDER — PANTOPRAZOLE SODIUM 40 MG PO TBEC
40.0000 mg | DELAYED_RELEASE_TABLET | Freq: Every day | ORAL | Status: DC
  Administered 2020-02-10 – 2020-02-11 (×2): 40 mg via ORAL
  Filled 2020-02-10 (×2): qty 1

## 2020-02-10 MED ORDER — NICOTINE 7 MG/24HR TD PT24
7.0000 mg | MEDICATED_PATCH | Freq: Every day | TRANSDERMAL | Status: DC
  Administered 2020-02-10: 7 mg via TRANSDERMAL
  Filled 2020-02-10 (×2): qty 1

## 2020-02-10 MED ORDER — MORPHINE SULFATE (PF) 2 MG/ML IV SOLN: 2.0000 mg | INTRAVENOUS | Status: DC | PRN

## 2020-02-10 NOTE — ED Provider Notes (Signed)
Patient signed out to me at 7 AM.  History of atrial fib on Xarelto, CKD, hypertension who presents to the ED with abdominal pain, rectal bleeding.  Normal vitals.  Hemoccult positive.  Having hematochezia with abdominal pain.  History of upper GI bleeds with small AVMs nonbleeding.  Hemoglobin is unremarkable.  Otherwise lab work unremarkable.  She is on blood thinner having bright red blood per rectum.  Suspect possible GI bleed and plan is to admit for observation.  Awaiting CT scan.  CT scan overall unremarkable.  Talked with Dr. Paulita Fujita with [facility] and will follow along.  Patient admitted to medicine in good condition.  This chart was dictated using voice recognition software.  Despite best efforts to proofread,  errors can occur which can change the documentation meaning.     Lennice Sites, DO 02/10/20 [phone]

## 2020-02-10 NOTE — Consult Note (Signed)
Referring Provider: Dr. Lennice Sites (ED) Primary Care Physician:  [name], MD Primary Gastroenterologist:  Dr. Alessandra Bevels [facility])  Reason for Consultation:  Rectal bleeding  HPI: [name] [name] is a 74 y.o. female with history of A fib (on Xarelto), CHF, CKD, COPD, CAD s/p CABG, upper GI bleeding presenting with hematochezia.  Patient was in her usual state of health until yesterday when she started experiencing some abdominal cramping.  The cramping began shortly after she ate a salad with jalapenos, stating jalapenos do not typically "agree"with her.  She had continued cramping, sweating, and fecal urgency, and when she had a bowel movement, she noted a small amount of bright red blood on the tissue paper.  She states that stools were soft and a little bit loose but not necessarily diarrheal.  Today, she had a bowel movement with a small amount of bright red blood in the toilet bowl as well.  Of note, she did take some Pepto-Bismol yesterday, but she has not noted any melena.  She reports chronic GERD and states she is currently taking Zantac 360 OTC.  She is not currently on PPI therapy.  Patient denies any dysphagia, nausea, vomiting, hematemesis, decreased appetite, unintentional weight loss.  Denies constipation. Reports feeling hungry and is interested in trying a soft diet today.  Patient takes Xarelto, last dose yesterday.  Denies recent antibiotic use or sick contacts.    Records reviewed to include recent gastrointestinal procedures: -Capsule endoscopy 10/31/19:minimal gastritis, proximal duodenal bulb ulcer, tiny nonbleeding AVM -EGD 10/27/19: widely patent Schatzki ring, 6 cm hiatal hernia, nodular mucosa in the duodenal bulb. -Colonoscopy 10/11/19:  Diverticulosis in the sigmoid colon. One 8 mm polyp in the proximal ascending colon (bx: tubular adenoma, no high grade dysplasia or malignancy), one 6 mm polyp in the transverse colon (bx: tubular adenoma, no high grade dysplasia  or malignancy), one 7 mm polyp in the transverse colon (bx: benign mucosa), one 8 mm polyp in the descending colon (bx: benign mucosa).  A few hyperplastic polyps in the rectum (bx: hyperplastic polyp). Internal hemorrhoids. -EGD 10/10/19: benign-appearing esophageal stenosis, dilated, 6 cm hiatal hernia. Normal mucosa was found in the entire stomach (bx: mild chronic gastritis with focal intestinal metaplasia, negative for H. pylori. No dysplasia or malignancy). A single gastric polyp (Benign gastric mucosa with mild chronic inflammation).  A few duodenal polyps (bx: peptic duodenitis, no dysplasia or malignancy)  Past Medical History:  Diagnosis Date  . Arthritis   . Asthma    ??  . Atopic dermatitis   . Cardiac arrest ([place]) 10/2014   [name]  . Cardiomyopathy, ischemic    a. EF previously low, improved to LVEF 50-55% as of May 2018  . Chronic combined systolic and diastolic heart failure ([place])   . CKD (chronic kidney disease), stage III   . COPD (chronic obstructive pulmonary disease) ([place])   . Esophageal dilatation 2013  . GERD (gastroesophageal reflux disease)   . Gout   . Headache   . Hypertension   . Hypokalemia   . Idiopathic angioedema   . LGI bleed 08/06/2017   a. felt to be hemorrhoidal during that admission (no drop in Hgb).  . Lower back pain   . Persistent atrial fibrillation (HCC)    a. h/o difficult to control rates (complicated by noncompliance), not felt to be a candidate for ablation or antiarrhythmic due to noncompliance.  . Personal history of noncompliance with medical treatment, presenting hazards to health   . Prediabetes   .  S/P CABG x 2 with clipping of LA appendage 11/14/2014   LIMA to LAD, SVG to OM, EVH via right thigh  . Urine incontinence   . Uterine fibroid     Past Surgical History:  Procedure Laterality Date  . BALLOON DILATION N/A 10/10/2019   Procedure: BALLOON DILATION;  Surgeon: Otis Brace, MD;  Location: [facility];  Service:  Gastroenterology;  Laterality: N/A;  . BIOPSY  10/10/2019   Procedure: BIOPSY;  Surgeon: Otis Brace, MD;  Location: [place];  Service: Gastroenterology;;  . BIOPSY  10/11/2019   Procedure: BIOPSY;  Surgeon: Otis Brace, MD;  Location: [place];  Service: Gastroenterology;;  . CARDIOVERSION N/A 11/18/2014   Procedure: CARDIOVERSION;  Surgeon: [name], MD;  Location: [place];  Service: Cardiovascular;  Laterality: N/A;  . CARDIOVERSION N/A 12/25/2017   Procedure: CARDIOVERSION;  Surgeon: [name], MD;  Location: [place];  Service: Cardiovascular;  Laterality: N/A;  . CLIPPING OF ATRIAL APPENDAGE N/A 11/14/2014   Procedure: CLIPPING OF ATRIAL APPENDAGE;  Surgeon: [name], MD;  Location: [place];  Service: Open Heart Surgery;  Laterality: N/A;  . COLONOSCOPY  2013  . COLONOSCOPY WITH PROPOFOL N/A 10/11/2019   Procedure: COLONOSCOPY WITH PROPOFOL;  Surgeon: Otis Brace, MD;  Location: [place];  Service: Gastroenterology;  Laterality: N/A;  . CORONARY ARTERY BYPASS GRAFT N/A 11/14/2014   Procedure: CORONARY ARTERY BYPASS GRAFTING (CABG)TIMES 2 USING LEFT INTERNAL MAMMARY ARTERY AND RIGHT SAPHENOUS VEIN HARVESTED ENDOSCOPICALLY;  Surgeon: [name], MD;  Location: [place];  Service: Open Heart Surgery;  Laterality: N/A;  . ESOPHAGOGASTRODUODENOSCOPY (EGD) WITH PROPOFOL N/A 10/10/2019   Procedure: ESOPHAGOGASTRODUODENOSCOPY (EGD) WITH PROPOFOL;  Surgeon: Otis Brace, MD;  Location: [facility];  Service: Gastroenterology;  Laterality: N/A;  . ESOPHAGOGASTRODUODENOSCOPY (EGD) WITH PROPOFOL N/A 10/27/2019   Procedure: ESOPHAGOGASTRODUODENOSCOPY (EGD) WITH PROPOFOL;  Surgeon: Ronnette Juniper, MD;  Location: [place];  Service: Gastroenterology;  Laterality: N/A;  . GIVENS CAPSULE STUDY N/A 10/27/2019   Procedure: GIVENS CAPSULE STUDY;  Surgeon: Ronnette Juniper, MD;  Location: [place];  Service: Gastroenterology;  Laterality: N/A;  . LEFT HEART  CATHETERIZATION WITH CORONARY ANGIOGRAM N/A 11/04/2014   Procedure: LEFT HEART CATHETERIZATION WITH CORONARY ANGIOGRAM;  Surgeon: Troy Sine, MD;  Location: [facility] CATH [facility];  Service: Cardiovascular;  Laterality: N/A;  . POLYPECTOMY  10/11/2019   Procedure: POLYPECTOMY;  Surgeon: Otis Brace, MD;  Location: [place] [facility];  Service: Gastroenterology;;  . TEE WITHOUT CARDIOVERSION N/A 11/14/2014   Procedure: TRANSESOPHAGEAL ECHOCARDIOGRAM (TEE);  Surgeon: [name], MD;  Location: [place];  Service: Open Heart Surgery;  Laterality: N/A;  . TEE WITHOUT CARDIOVERSION N/A 12/25/2017   Procedure: TRANSESOPHAGEAL ECHOCARDIOGRAM (TEE);  Surgeon: [name], MD;  Location: [facility] ENDOSCOPY;  Service: Cardiovascular;  Laterality: N/A;  . TEMPORARY PACEMAKER INSERTION  11/04/2014   Procedure: TEMPORARY PACEMAKER INSERTION;  Surgeon: Troy Sine, MD;  Location: [facility] C[facility] LAB;  Service: Cardiovascular;;  . TOOTH EXTRACTION      Prior to Admission medications   Medication Sig Start Date End Date Taking? Authorizing Provider  acetaminophen (TYLENOL) 500 MG tablet Take 500 mg by mouth every 6 (six) hours as needed for moderate pain.    Yes [provider]  allopurinol (ZYLOPRIM) 300 MG tablet Take 1 tablet (300 mg total) by mouth daily. 01/04/18  Yes Georgiana Shore, NP  atorvastatin (LIPITOR) 10 MG tablet Take 10 mg by mouth daily.   Yes [provider]  furosemide (LASIX)  20 MG tablet Take 1 tablet (20 mg total) by mouth daily. 10/11/19 02/10/20 Yes Shelly Coss, MD  losartan (COZAAR) 25 MG tablet Take 1 tablet (25 mg total) by mouth 2 (two) times daily. 07/17/19  Yes Bensimhon, Shaune Pascal, MD  metoprolol succinate (TOPROL-XL) 50 MG 24 hr tablet TAKE ONE TABLET BY MOUTH ONCE DAILY WITH OR immediately following A meal Patient taking differently: Take 50 mg by mouth daily.  12/12/19  Yes Bensimhon, Shaune Pascal, MD  rivaroxaban (XARELTO) 20 MG TABS tablet Take 1 tablet (20 mg total) by mouth  daily with supper. Resume from 11/01/2019 11/01/19  Yes Aline August, MD  senna-docusate (SENOKOT-S) 8.6-50 MG tablet Take 1 tablet by mouth 2 (two) times daily. Patient taking differently: Take 1 tablet by mouth daily as needed for mild constipation.  10/29/19  Yes Aline August, MD  spironolactone (ALDACTONE) 25 MG tablet Take 1 tablet (25 mg total) by mouth daily. Patient taking differently: Take 12.5 mg by mouth daily.  12/02/19 03/01/20 Yes Kathyrn Drown D, NP  triamcinolone ointment (KENALOG) 0.5 % Apply 1 application topically 2 (two) times daily as needed (itching, rash).  11/15/19  Yes [provider]  pantoprazole (PROTONIX) 40 MG tablet Take 1 tablet (40 mg total) by mouth 2 (two) times daily. Patient taking differently: Take 40 mg by mouth daily.  10/29/19   Aline August, MD    Scheduled Meds: Continuous Infusions: . sodium chloride 125 mL/hr at 02/10/20 0649   PRN Meds:.  Allergies as of 02/10/2020 - Review Complete 02/10/2020  Allergen Reaction Noted  . Bee venom Anaphylaxis 10/29/2014  . Ivp dye [iodinated diagnostic agents] Anaphylaxis 10/27/2019  . Codeine Nausea And Vomiting 02/25/2015  . Entresto [sacubitril-valsartan]  08/27/2019  . Shrimp [shellfish allergy] Swelling 10/29/2014    Family History  Problem Relation Age of Onset  . Cancer Mother        LYMPHOMA  . Heart disease Father   . Other Father        TB  . CVA Sister   . Prostate cancer Brother 39  . Diabetes Brother     Social History   Socioeconomic History  . Marital status: Widowed    Spouse name: Not on file  . Number of children: 1  . Years of education: 29  . Highest education level: Not on file  Occupational History  . Occupation: RETIRED [employer]  Tobacco Use  . Smoking status: Former Smoker    Packs/day: 0.10    Years: 56.00    Pack years: 5.60    Types: Cigarettes    Quit date: 2019    Years since quitting: 2.4  . Smokeless tobacco: Never Used  Vaping Use   . Vaping Use: Never used  Substance and Sexual Activity  . Alcohol use: No    Alcohol/week: 0.0 standard drinks  . Drug use: No  . Sexual activity: Not on file  Other Topics Concern  . Not on file  Social History Narrative   Patient reports it being difficult to pay for everything due to outstanding medical bills from hospital stay last year but states she is able to keep up with basic expenses.  Patient does have some concerns with her house's condition due to a water leak- had her roof repaired last month but has some leaking in the front which has affected her porch.  Patient owns her own car and is able to drive herself but has some concerns about the reliability of her  car- has gotten taxi to the clinic for appointment in the past when her car broke down.   Social Determinants of Health   Financial Resource Strain:   . Difficulty of Paying Living Expenses:   Food Insecurity:   . Worried About Charity fundraiser in the Last Year:   . Arboriculturist in the Last Year:   Transportation Needs:   . Film/video editor (Medical):   Marland Kitchen Lack of Transportation (Non-Medical):   Physical Activity:   . Days of Exercise per Week:   . Minutes of Exercise per Session:   Stress:   . Feeling of Stress :   Social Connections:   . Frequency of Communication with Friends and Family:   . Frequency of Social Gatherings with Friends and Family:   . Attends Religious Services:   . Active Member of Clubs or Organizations:   . Attends Archivist Meetings:   Marland Kitchen Marital Status:   Intimate Partner Violence:   . Fear of Current or Ex-Partner:   . Emotionally Abused:   Marland Kitchen Physically Abused:   . Sexually Abused:     Review of Systems: Review of Systems  Constitutional: Positive for diaphoresis. Negative for chills, fever, malaise/fatigue and weight loss.  HENT: Negative for hearing loss and tinnitus.   Eyes: Negative for pain and redness.  Respiratory: Negative for cough and shortness  of breath.   Cardiovascular: Negative for chest pain and palpitations.  Gastrointestinal: Positive for abdominal pain, blood in stool and heartburn. Negative for constipation, diarrhea, melena, nausea and vomiting.  Genitourinary: Negative for flank pain and hematuria.  Musculoskeletal: Negative for falls and neck pain.  Skin: Negative for itching and rash.  Neurological: Negative for seizures and loss of consciousness.  Endo/Heme/Allergies: Negative for polydipsia. Does not bruise/bleed easily.  Psychiatric/Behavioral: Negative for substance abuse. The patient is not nervous/anxious.      Physical Exam: Vital signs: Vitals:   02/10/20 0609 02/10/20 0610  BP:  (!) 158/76  Pulse: 67 64  Resp:  19  Temp:  98.5 F (36.9 C)  SpO2: 99% 99%     Physical Exam Vitals reviewed.  Constitutional:      General: She is not in acute distress.    Appearance: Normal appearance.  HENT:     Head: Normocephalic and atraumatic.     Nose: Nose normal.     Mouth/Throat:     Mouth: Mucous membranes are moist.     Pharynx: Oropharynx is clear.  Eyes:     General: No scleral icterus.    Extraocular Movements: Extraocular movements intact.     Conjunctiva/sclera: Conjunctivae normal.  Cardiovascular:     Rate and Rhythm: Normal rate and regular rhythm.     Pulses: Normal pulses.     Heart sounds: Normal heart sounds.  Pulmonary:     Effort: Pulmonary effort is normal. No respiratory distress.     Breath sounds: Normal breath sounds.  Abdominal:     General: Bowel sounds are normal. There is no distension.     Palpations: Abdomen is soft. There is no mass.     Tenderness: There is no abdominal tenderness. There is no guarding or rebound.     Hernia: No hernia is present.  Musculoskeletal:        General: No swelling or tenderness.     Cervical back: Normal range of motion and neck supple.  Skin:    General: Skin is warm and dry.  Neurological:  General: No focal deficit present.      Mental Status: She is oriented to person, place, and time. She is lethargic.  Psychiatric:        Mood and Affect: Mood normal.        Behavior: Behavior normal. Behavior is cooperative.     GI:  Lab Results: Recent Labs    02/10/20 0444  WBC 10.0  HGB 13.0  HCT 43.2  PLT 165   BMET Recent Labs    02/10/20 0444  NA 143  K 3.5  CL 114*  CO2 19*  GLUCOSE 120*  BUN 16  CREATININE 1.13*  CALCIUM 8.9   LFT Recent Labs    02/10/20 0444  PROT 6.9  ALBUMIN 3.4*  AST 13*  ALT 11  ALKPHOS 136*  BILITOT 0.5   PT/INR Recent Labs    02/10/20 0444  LABPROT 25.9*  INR 2.5*    Studies/Results: CT ABDOMEN PELVIS WO CONTRAST  Result Date: 02/10/2020 CLINICAL DATA:  Lower abdominal pain with cramping and rectal bleeding since Friday EXAM: CT ABDOMEN AND PELVIS WITHOUT CONTRAST TECHNIQUE: Multidetector CT imaging of the abdomen and pelvis was performed following the standard protocol without IV contrast. COMPARISON:  02/12/2009 FINDINGS: Lower chest:  Partially covered cardiomegaly. Hepatobiliary: No focal liver abnormality.No evidence of biliary obstruction or stone. Pancreas: Unremarkable. Spleen: Unremarkable. Adrenals/Urinary Tract: Negative adrenals. No hydronephrosis or stone. Asymmetric left renal atrophy. Unremarkable bladder. Stomach/Bowel: No obstruction. There is mild stranding around the appendix which is nondilated and primarily contains gas, likely incidental/scarring. Few left colonic diverticula. Vascular/Lymphatic: No acute vascular abnormality. Aortic and branch vessel atherosclerosis. No mass or adenopathy. Reproductive:Fibroid uterus with a calcified central uterine fibroid measuring 5 cm. There is a right adnexal intermediate density mass measuring 5 cm, likely present in 2010, although larger. This favors an exophytic right ovarian fibroid and was noted on a pelvic ultrasound from 2015. Other: No ascites or pneumoperitoneum. Musculoskeletal: No acute  abnormalities. Spinal degeneration, most notably at L4-5 bulging disc which narrows the foramina and canal. IMPRESSION: 1. No acute finding. 2. Mild colonic diverticulosis. 3.  Aortic Atherosclerosis (ICD10-I70.0). 4. Fibroid uterus. Electronically Signed   By: Monte Fantasia M.D.   On: 02/10/2020 07:24    Impression: Hematochezia, most likely related to internal hemorrhoids.  Patient reports only a small amount of bleeding; diverticular bleeding less likely.   -Hgb stable at 13.0   Abdominal cramping: possibly related to recently consumed salad/jalepenos. -CT negative for any acute findings  GERD: uncontrolled on OTC Zantac 360  A fib, on Xarelto - last dose 6/27  CKD: BUN 16/ Cr 1.13, at baseline  Plan: No need for colonoscopic evaluation at this time, as patient had recent colonoscopy, and bleeding is minimal and patient remains hemodynamically stable.  Continue to monitor H&H.  If patient has diarrhea/loose stools, recommend GI pathogen panel and C. Diff testing.  For GERD, recommend Protonix 40mg  once daily instead of Zantac OTC, as patient has a history of a duodenal ulcer.  Soft diet OK.  [facility] will sign off.  Please contact us if we can be of any further assistance during this hospital stay.   LOS: 0 days   Salley Slaughter  PA-C 02/10/2020, 8:36 AM  Contact #  [phone]

## 2020-02-10 NOTE — ED Provider Notes (Signed)
TIME SEEN: 6:02 AM  CHIEF COMPLAINT: Rectal bleeding, abdominal pain  HPI: Patient is a 74 year old female with history of A. fib on Xarelto, CHF, chronic kidney disease, COPD, CAD status post CABG, previous GI bleed followed by [facility] who presents to the emergency department with diffuse crampy abdominal pain that is intermittent nausea.  Reports that she has had bright red blood per rectum since noon yesterday and has had multiple bloody bowel movements.  States her last bowel movement here was just blood in her stool.  She denies melena.  No vomiting.  No fever.  No chest pain or shortness of breath.  Last capsule endoscopy was 10/27/2019 which showed minimal gastritis, proximal duodenal ulcer with bleeding, tiny nonbleeding AVM.  Last colonoscopy was 10/11/2019 and showed multiple polyps, diverticula within the sigmoid colon, internal hemorrhoids.  ROS: See HPI Constitutional: no fever  Eyes: no drainage  ENT: no runny nose   Cardiovascular:  no chest pain  Resp: no SOB  GI: no vomiting GU: no dysuria Integumentary: no rash  Allergy: no hives  Musculoskeletal: no leg swelling  Neurological: no slurred speech ROS otherwise negative  PAST MEDICAL HISTORY/PAST SURGICAL HISTORY:  Past Medical History:  Diagnosis Date  . Arthritis   . Asthma    ??  . Atopic dermatitis   . Cardiac arrest ([place]) 10/2014   [name]  . Cardiomyopathy, ischemic    a. EF previously low, improved to LVEF 50-55% as of May 2018  . Chronic combined systolic and diastolic heart failure ([place])   . CKD (chronic kidney disease), stage III   . COPD (chronic obstructive pulmonary disease) ([place])   . Esophageal dilatation 2013  . GERD (gastroesophageal reflux disease)   . Gout   . Headache   . Hypertension   . Hypokalemia   . Idiopathic angioedema   . LGI bleed 08/06/2017   a. felt to be hemorrhoidal during that admission (no drop in Hgb).  . Lower back pain   . Persistent atrial fibrillation ([facility])    a.  h/o difficult to control rates (complicated by noncompliance), not felt to be a candidate for ablation or antiarrhythmic due to noncompliance.  . Personal history of noncompliance with medical treatment, presenting hazards to health   . Prediabetes   . S/P CABG x 2 with clipping of LA appendage 11/14/2014   LIMA to LAD, SVG to OM, EVH via right thigh  . Urine incontinence   . Uterine fibroid     MEDICATIONS:  Prior to Admission medications   Medication Sig Start Date End Date Taking? Authorizing Provider  acetaminophen (TYLENOL) 500 MG tablet Take 500 mg by mouth every 6 (six) hours as needed for moderate pain.     [provider]  allopurinol (ZYLOPRIM) 300 MG tablet Take 1 tablet (300 mg total) by mouth daily. 01/04/18   Georgiana Shore, NP  atorvastatin (LIPITOR) 10 MG tablet Take 10 mg by mouth daily.    [provider]  ferrous sulfate 325 (65 FE) MG EC tablet Take 1 tablet (325 mg total) by mouth daily with breakfast. 10/29/19 12/02/19  Aline August, MD  furosemide (LASIX) 20 MG tablet Take 1 tablet (20 mg total) by mouth daily. 10/11/19 12/10/19  Shelly Coss, MD  losartan (COZAAR) 25 MG tablet Take 1 tablet (25 mg total) by mouth 2 (two) times daily. 07/17/19   Bensimhon, Shaune Pascal, MD  metoprolol succinate (TOPROL-XL) 50 MG 24 hr tablet TAKE ONE TABLET BY MOUTH ONCE DAILY  WITH OR immediately following A meal 12/12/19   Bensimhon, Shaune Pascal, MD  pantoprazole (PROTONIX) 40 MG tablet Take 1 tablet (40 mg total) by mouth 2 (two) times daily. 10/29/19   Aline August, MD  rivaroxaban (XARELTO) 20 MG TABS tablet Take 1 tablet (20 mg total) by mouth daily with supper. Resume from 11/01/2019 11/01/19   Aline August, MD  senna-docusate (SENOKOT-S) 8.6-50 MG tablet Take 1 tablet by mouth 2 (two) times daily. 10/29/19   Aline August, MD  spironolactone (ALDACTONE) 25 MG tablet Take 1 tablet (25 mg total) by mouth daily. 12/02/19 03/01/20  Kathyrn Drown D, NP  triamcinolone ointment  (KENALOG) 0.5 % APPLY TO THE AFFECTED AREA(S) TOPICALLY TWICE DAILY 11/15/19   [provider]    ALLERGIES:  Allergies  Allergen Reactions  . Bee Venom Anaphylaxis  . Ivp Dye [Iodinated Diagnostic Agents] Anaphylaxis  . Codeine Nausea And Vomiting  . Entresto [Sacubitril-Valsartan]     Chest pain   . Shrimp [Shellfish Allergy] Swelling    SOCIAL HISTORY:  Social History   Tobacco Use  . Smoking status: Former Smoker    Packs/day: 0.10    Years: 56.00    Pack years: 5.60    Types: Cigarettes    Quit date: 2019    Years since quitting: 2.4  . Smokeless tobacco: Never Used  Substance Use Topics  . Alcohol use: No    Alcohol/week: 0.0 standard drinks    FAMILY HISTORY: Family History  Problem Relation Age of Onset  . Cancer Mother        LYMPHOMA  . Heart disease Father   . Other Father        TB  . CVA Sister   . Prostate cancer Brother 14  . Diabetes Brother     EXAM: BP (!) 146/87 (BP Location: Right Arm)   Pulse 78   Temp 98.7 F (37.1 C) (Oral)   Resp 16   Ht 5\' 7"  (1.702 m)   Wt 102.1 kg   SpO2 100%   BMI 35.24 kg/m  CONSTITUTIONAL: Alert and oriented and responds appropriately to questions. Well-appearing; well-nourished, elderly, afebrile HEAD: Normocephalic EYES: Conjunctivae clear, pupils appear equal, EOM appear intact ENT: normal nose; moist mucous membranes NECK: Supple, normal ROM CARD: RRR; S1 and S2 appreciated; no murmurs, no clicks, no rubs, no gallops RESP: Normal chest excursion without splinting or tachypnea; breath sounds clear and equal bilaterally; no wheezes, no rhonchi, no rales, no hypoxia or respiratory distress, speaking full sentences ABD/GI: Normal bowel sounds; non-distended; soft, diffusely tender to palpation, no rebound, no guarding, no peritoneal signs, no hepatosplenomegaly RECTAL:  Normal rectal tone, patient has moderate amount of gross red blood on rectal exam is guaiac positive, no melena, no external  hemorrhoids appreciated, nontender rectal exam, no fecal impaction BACK:  The back appears normal EXT: Normal ROM in all joints; no deformity noted, no edema; no cyanosis SKIN: Normal color for age and race; warm; no rash on exposed skin NEURO: Moves all extremities equally PSYCH: [name] mood and manner are appropriate.   MEDICAL DECISION MAKING: Patient here with abdominal pain, rectal bleeding while on Xarelto.  Initially in A. fib with RVR but has spontaneously converted to normal sinus rhythm and is rate controlled.  Currently hemodynamically stable.  Diffusely tender throughout the abdomen but nonperitoneal.  We will proceed with CT of the abdomen pelvis.  Differential includes diverticular bleed, diverticulitis, colitis.  Doubt perforation.  Doubt bowel obstruction.  Anticipate admission.  Labs pending.  Will give pain and nausea medicine.  ED PROGRESS: Patient's hemoglobin is 13.  INR is 2.5.  Platelets 165,000.  CT of the abdomen pelvis pending.  Signed out to Dr. Ronnald Nian to follow-up on imaging and admit patient.  I reviewed all nursing notes and pertinent previous records as available.  I have reviewed and interpreted any EKGs, lab and urine results, imaging (as available).     EKG Interpretation  Date/Time:  Monday February 10 2020 04:43:01 EDT Ventricular Rate:  136 PR Interval:    QRS Duration: 92 QT Interval:  334 QTC Calculation: 502 R Axis:   9 Text Interpretation: Atrial flutter with variable A-V block with premature ventricular or aberrantly conducted complexes Left ventricular hypertrophy with repolarization abnormality ( Cornell product ) Abnormal ECG Confirmed by Pryor Curia [phone]) on 02/10/2020 6:02:34 AM        EKG Interpretation  Date/Time:  Monday February 10 2020 06:11:37 EDT Ventricular Rate:  64 PR Interval:    QRS Duration: 107 QT Interval:  430 QTC Calculation: 444 R Axis:   11 Text Interpretation: Sinus rhythm Supraventricular bigeminy Repol  abnrm suggests ischemia, lateral leads similar to prior A fib has resolved Confirmed by Pryor Curia [phone]) on 02/10/2020 6:17:14 AM            [name] [name] was evaluated in Emergency Department on 02/10/2020 for the symptoms described in the history of present illness. She was evaluated in the context of the global COVID-19 pandemic, which necessitated consideration that the patient might be at risk for infection with the SARS-CoV-2 virus that causes COVID-19. Institutional protocols and algorithms that pertain to the evaluation of patients at risk for COVID-19 are in a state of rapid change based on information released by regulatory bodies including the CDC and federal and state organizations. These policies and algorithms were followed during the patient's care in the ED.      [name] [name], Delice Bison, DO 02/10/20 [phone]

## 2020-02-10 NOTE — ED Notes (Signed)
Pt given turkey sandwich

## 2020-02-10 NOTE — ED Notes (Signed)
Attempted report to [facility] x 1

## 2020-02-10 NOTE — Progress Notes (Signed)
Patient arrived to room in NAD, VS stable and patient free from pain. Patient oriented to room.  

## 2020-02-10 NOTE — H&P (Signed)
History and Physical    [name] [name]:[id number] DOB: 1945/11/29 DOA: 02/10/2020  PCP: [name], MD Consultants:  Marlou Porch - cardiology; Alessandra Bevels - GI Patient coming from:  Home - lives alone, has a boarder living home; NOK: Daughter, Riko Lumsden, [phone]    Chief Complaint: Rectal bleeding  HPI: [name] [name] is a 74 y.o. female with medical history significant of CAD s/p CABG; afib on Xarelto; HTN; COPD; stage 3 CKD; and chronic combined CHF presenting with recurrent bleeding.  She was previously hospitalized from 2/23-26 for UGI bleeding.  EGD was performed and did not show a bleeding source.  Colonoscopy was performed with diverticulosis and a few polyps with internal hemorrhoids, but also without active bleeding.   EGD was performed again for recurrent bleeding and there was no bleeding noted; a 6 cm hiatal hernia and widely patent Schatzki ring were present and a capsule enteroscope was placed.  Capsule endoscopy showed minimal gastritis; a proximal duodenal ulcer with bleeding; and a tiny nonbleeding AVM.  Xarelto was held for 3 more days; she was started on PPI BID; and she was told to avoid NSAIDs.  Yesterday, she got up and wasn't feeling very well.  She thought maybe she as anemic.  She had coffee and went to the store.  She started cooking dinner but was feeling sort of sick.  She developed abdominal pain and felt like she needed to poop.  She felt like she was going to pass out, severe abdominal cramps.  She took Pepto and that calmed her stomach down for about 3 hours.  She went to eat a little something.  She developed recurrent abdominal pain about midnight.  She wasn't able to poop so she laid down and had severe cramps.  She tried again - unable to make stool but she had BRBPR.  She is not having BMs but is having blood in the commode (x 1) and on the tissue (x3-4).  Her cramping is a little better but still ongoing.    ED Course:  BRBPR this time.  Mild  yesterday, worsening overnight with grossly bloody stools.  +crampy abdominal pain.  Dr. Paulita Fujita to see.   Review of Systems: As per HPI; otherwise review of systems reviewed and negative.   Ambulatory Status:  Ambulates without assistance  COVID Vaccine Status:  None  Past Medical History:  Diagnosis Date  . Arthritis   . Asthma    ??  . Atopic dermatitis   . Cardiac arrest ([place]) 10/2014   [name]  . Cardiomyopathy, ischemic    a. EF previously low, improved to LVEF 50-55% as of May 2018  . Chronic combined systolic and diastolic heart failure ([place])   . CKD (chronic kidney disease), stage III   . COPD (chronic obstructive pulmonary disease) ([place])   . Esophageal dilatation 2013  . GERD (gastroesophageal reflux disease)   . Gout   . Headache   . Hypertension   . Hypokalemia   . Idiopathic angioedema   . LGI bleed 08/06/2017   a. felt to be hemorrhoidal during that admission (no drop in Hgb).  . Lower back pain   . Persistent atrial fibrillation (HCC)    a. h/o difficult to control rates (complicated by noncompliance), not felt to be a candidate for ablation or antiarrhythmic due to noncompliance.  . Personal history of noncompliance with medical treatment, presenting hazards to health   . Prediabetes   . S/P CABG x 2 with clipping of LA  appendage 11/14/2014   LIMA to LAD, SVG to OM, EVH via right thigh  . Urine incontinence   . Uterine fibroid     Past Surgical History:  Procedure Laterality Date  . BALLOON DILATION N/A 10/10/2019   Procedure: BALLOON DILATION;  Surgeon: Otis Brace, MD;  [facility]: [facility];  Service: Gastroenterology;  Laterality: N/A;  . BIOPSY  10/10/2019   Procedure: BIOPSY;  Surgeon: Otis Brace, MD;  Location: [place];  Service: Gastroenterology;;  . BIOPSY  10/11/2019   Procedure: BIOPSY;  Surgeon: Otis Brace, MD;  Location: [place];  Service: Gastroenterology;;  . CARDIOVERSION N/A 11/18/2014   Procedure:  CARDIOVERSION;  Surgeon: [name], MD;  Location: [place];  Service: Cardiovascular;  Laterality: N/A;  . CARDIOVERSION N/A 12/25/2017   Procedure: CARDIOVERSION;  Surgeon: [name], MD;  Location: [place];  Service: Cardiovascular;  Laterality: N/A;  . CLIPPING OF ATRIAL APPENDAGE N/A 11/14/2014   Procedure: CLIPPING OF ATRIAL APPENDAGE;  Surgeon: [name], MD;  Location: [place];  Service: Open Heart Surgery;  Laterality: N/A;  . COLONOSCOPY  2013  . COLONOSCOPY WITH PROPOFOL N/A 10/11/2019   Procedure: COLONOSCOPY WITH PROPOFOL;  Surgeon: Otis Brace, MD;  Location: [place];  Service: Gastroenterology;  Laterality: N/A;  . CORONARY ARTERY BYPASS GRAFT N/A 11/14/2014   Procedure: CORONARY ARTERY BYPASS GRAFTING (CABG)TIMES 2 USING LEFT INTERNAL MAMMARY ARTERY AND RIGHT SAPHENOUS VEIN HARVESTED ENDOSCOPICALLY;  Surgeon: [name], MD;  Location: [place];  Service: Open Heart Surgery;  Laterality: N/A;  . ESOPHAGOGASTRODUODENOSCOPY (EGD) WITH PROPOFOL N/A 10/10/2019   Procedure: ESOPHAGOGASTRODUODENOSCOPY (EGD) WITH PROPOFOL;  Surgeon: Otis Brace, MD;  [facility];  Service: Gastroenterology;  Laterality: N/A;  . ESOPHAGOGASTRODUODENOSCOPY (EGD) WITH PROPOFOL N/A 10/27/2019   Procedure: ESOPHAGOGASTRODUODENOSCOPY (EGD) WITH PROPOFOL;  Surgeon: Ronnette Juniper, MD;  Location: [place];  Service: Gastroenterology;  Laterality: N/A;  . GIVENS CAPSULE STUDY N/A 10/27/2019   Procedure: GIVENS CAPSULE STUDY;  Surgeon: Ronnette Juniper, MD;  Location: [place];  Service: Gastroenterology;  Laterality: N/A;  . LEFT HEART CATHETERIZATION WITH CORONARY ANGIOGRAM N/A 11/04/2014   Procedure: LEFT HEART CATHETERIZATION WITH CORONARY ANGIOGRAM;  Surgeon: Troy Sine, MD;  Location[facility] C[facility] LAB;  Service: Cardiovascular;  Laterality: N/A;  . POLYPECTOMY  10/11/2019   Procedure: POLYPECTOMY;  Surgeon: Otis Brace, MD;  Location: [place] [facility]OSCOPY;  Service:  Gastroenterology;;  . TEE WITHOUT CARDIOVERSION N/A 11/14/2014   Procedure: TRANSESOPHAGEAL ECHOCARDIOGRAM (TEE);  Surgeon: [name], MD;  Location: [place];  Service: Open Heart Surgery;  Laterality: N/A;  . TEE WITHOUT CARDIOVERSION N/A 12/25/2017   Procedure: TRANSESOPHAGEAL ECHOCARDIOGRAM (TEE);  Surgeon: [name], MD;  Location: [facility] ENDOSCOPY;  Service: Cardiovascular;  Laterality: N/A;  . TEMPORARY PACEMAKER INSERTION  11/04/2014   Procedure: TEMPORARY PACEMAKER INSERTION;  Surgeon: Troy Sine, MD;  Location: [facility] CATH LAB;  Service: Cardiovascular;;  . TOOTH EXTRACTION      Social History   Socioeconomic History  . Marital status: Widowed    Spouse name: Not on file  . Number of children: 1  . Years of education: 54  . Highest education level: Not on file  Occupational History  . Occupation: RETIRED [employer]  Tobacco Use  . Smoking status: Current Some Day Smoker    Packs/day: 0.10    Years: 56.00    Pack years: 5.60    Types: Cigarettes  . Smokeless tobacco: Never Used  . Tobacco comment: "I might  still take a puff now and then"  Vaping Use  . Vaping Use: Never used  Substance and Sexual Activity  . Alcohol use: No    Alcohol/week: 0.0 standard drinks  . Drug use: No  . Sexual activity: Not on file  Other Topics Concern  . Not on file  Social History Narrative   Patient reports it being difficult to pay for everything due to outstanding medical bills from hospital stay last year but states she is able to keep up with basic expenses.  Patient does have some concerns with her house's condition due to a water leak- had her roof repaired last month but has some leaking in the front which has affected her porch.  Patient owns her own car and is able to drive herself but has some concerns about the reliability of her car- has gotten taxi to the clinic for appointment in the past when her car broke down.   Social Determinants of Health   Financial  Resource Strain:   . Difficulty of Paying Living Expenses:   Food Insecurity:   . Worried About Charity fundraiser in the Last Year:   . Arboriculturist in the Last Year:   Transportation Needs:   . Film/video editor (Medical):   Marland Kitchen Lack of Transportation (Non-Medical):   Physical Activity:   . Days of Exercise per Week:   . Minutes of Exercise per Session:   Stress:   . Feeling of Stress :   Social Connections:   . Frequency of Communication with Friends and Family:   . Frequency of Social Gatherings with Friends and Family:   . Attends Religious Services:   . Active Member of Clubs or Organizations:   . Attends Archivist Meetings:   Marland Kitchen Marital Status:   Intimate Partner Violence:   . Fear of Current or Ex-Partner:   . Emotionally Abused:   Marland Kitchen Physically Abused:   . Sexually Abused:     Allergies  Allergen Reactions  . Bee Venom Anaphylaxis  . Ivp Dye [Iodinated Diagnostic Agents] Anaphylaxis  . Codeine Nausea And Vomiting  . Entresto [Sacubitril-Valsartan]     Chest pain   . Shrimp [Shellfish Allergy] Swelling    Family History  Problem Relation Age of Onset  . Cancer Mother        LYMPHOMA  . Heart disease Father   . Other Father        TB  . CVA Sister   . Prostate cancer Brother 24  . Diabetes Brother     Prior to Admission medications   Medication Sig Start Date End Date Taking? Authorizing Provider  acetaminophen (TYLENOL) 500 MG tablet Take 500 mg by mouth every 6 (six) hours as needed for moderate pain.    Yes [provider]  allopurinol (ZYLOPRIM) 300 MG tablet Take 1 tablet (300 mg total) by mouth daily. 01/04/18  Yes Georgiana Shore, NP  atorvastatin (LIPITOR) 10 MG tablet Take 10 mg by mouth daily.   Yes [provider]  furosemide (LASIX) 20 MG tablet Take 1 tablet (20 mg total) by mouth daily. 10/11/19 02/10/20 Yes Shelly Coss, MD  losartan (COZAAR) 25 MG tablet Take 1 tablet (25 mg total) by mouth 2 (two) times  daily. 07/17/19  Yes Bensimhon, Shaune Pascal, MD  metoprolol succinate (TOPROL-XL) 50 MG 24 hr tablet TAKE ONE TABLET BY MOUTH ONCE DAILY WITH OR immediately following A meal Patient taking differently: Take 50 mg by  mouth daily.  12/12/19  Yes Bensimhon, Shaune Pascal, MD  rivaroxaban (XARELTO) 20 MG TABS tablet Take 1 tablet (20 mg total) by mouth daily with supper. Resume from 11/01/2019 11/01/19  Yes Aline August, MD  senna-docusate (SENOKOT-S) 8.6-50 MG tablet Take 1 tablet by mouth 2 (two) times daily. Patient taking differently: Take 1 tablet by mouth daily as needed for mild constipation.  10/29/19  Yes Aline August, MD  spironolactone (ALDACTONE) 25 MG tablet Take 1 tablet (25 mg total) by mouth daily. Patient taking differently: Take 12.5 mg by mouth daily.  12/02/19 03/01/20 Yes Kathyrn Drown D, NP  triamcinolone ointment (KENALOG) 0.5 % Apply 1 application topically 2 (two) times daily as needed (itching, rash).  11/15/19  Yes [provider]  pantoprazole (PROTONIX) 40 MG tablet Take 1 tablet (40 mg total) by mouth 2 (two) times daily. Patient taking differently: Take 40 mg by mouth daily.  10/29/19   Aline August, MD    Physical Exam: Vitals:   02/10/20 5102 02/10/20 0608 02/10/20 0609 02/10/20 0610  BP:  (!) 158/76  (!) 158/76  Pulse:   67 64  Resp: 18 (!) 22  19  Temp:    98.5 F (36.9 C)  TempSrc:    Oral  SpO2:   99% 99%  Weight:      Height:         . General:  Appears calm and comfortable and is NAD . Eyes:  PERRL, EOMI, normal lids, iris . ENT:  grossly normal hearing, lips & tongue, mmm; artificial upper  dentition . Neck:  no LAD, masses or thyromegaly . Cardiovascular:  RRR, no m/r/g. No LE edema.  Marland Kitchen Respiratory:   CTA bilaterally with no wheezes/rales/rhonchi.  Normal respiratory effort. . Abdomen:  soft, mildly TTP diffusely but worse in LLQ, ND, NABS . Skin:  no rash or induration seen on limited exam . Musculoskeletal:  grossly normal tone BUE/BLE, good  ROM, no bony abnormality . Psychiatric:  grossly normal mood and affect, speech fluent and appropriate, AOx3 . Neurologic:  CN 2-12 grossly intact, moves all extremities in coordinated fashion    Radiological Exams on Admission: CT ABDOMEN PELVIS WO CONTRAST  Result Date: 02/10/2020 CLINICAL DATA:  Lower abdominal pain with cramping and rectal bleeding since Friday EXAM: CT ABDOMEN AND PELVIS WITHOUT CONTRAST TECHNIQUE: Multidetector CT imaging of the abdomen and pelvis was performed following the standard protocol without IV contrast. COMPARISON:  02/12/2009 FINDINGS: Lower chest:  Partially covered cardiomegaly. Hepatobiliary: No focal liver abnormality.No evidence of biliary obstruction or stone. Pancreas: Unremarkable. Spleen: Unremarkable. Adrenals/Urinary Tract: Negative adrenals. No hydronephrosis or stone. Asymmetric left renal atrophy. Unremarkable bladder. Stomach/Bowel: No obstruction. There is mild stranding around the appendix which is nondilated and primarily contains gas, likely incidental/scarring. Few left colonic diverticula. Vascular/Lymphatic: No acute vascular abnormality. Aortic and branch vessel atherosclerosis. No mass or adenopathy. Reproductive:Fibroid uterus with a calcified central uterine fibroid measuring 5 cm. There is a right adnexal intermediate density mass measuring 5 cm, likely present in 2010, although larger. This favors an exophytic right ovarian fibroid and was noted on a pelvic ultrasound from 2015. Other: No ascites or pneumoperitoneum. Musculoskeletal: No acute abnormalities. Spinal degeneration, most notably at L4-5 bulging disc which narrows the foramina and canal. IMPRESSION: 1. No acute finding. 2. Mild colonic diverticulosis. 3.  Aortic Atherosclerosis (ICD10-I70.0). 4. Fibroid uterus. Electronically Signed   By: Monte Fantasia M.D.   On: 02/10/2020 07:24    EKG: Independently reviewed.  NSR  with rate 64; nonspecific ST changes with no evidence of acute  ischemia   Labs on Admission: I have personally reviewed the available labs and imaging studies at the time of the admission.  Pertinent labs:   Glucose 120 BUN 16/Creatinine 1.13/GFR 55 - stable AP 136 Albumin 3.4 WBC 10.0 Hgb 13.0; 11.0 on 4/19 INR 2.5 Heme positive   Assessment/Plan Principal Problem:   Acute lower GI bleeding Active Problems:   Chronic combined systolic and diastolic CHF (congestive heart failure) (HCC)   Accelerated hypertension   CKD (chronic kidney disease), stage III   Paroxysmal atrial fibrillation (HCC)   COPD (chronic obstructive pulmonary disease) (HCC)   Tobacco dependence   Obesity, Class II, BMI 35-39.9   Acute lower GI bleeding -Patient with prior recent admissions for bleeding with eventual diagnosis of bleeding duodenal ulcer -She has been stable and then developed acute abdominal cramping yesterday followed by 1 episode of BRBPR in the commode and several more on toilet paper -Appears to be mild bleeding, likely hemorrhoidal in nature -Hgb is normal/stable -Will resume Protonix daily, per GI -Will continue Xarelto for now and monitor for additional bleeding -Recheck CBC at 1600 -If bleeding stabilizes/resolves, patient likely is ok for d/c to home tomorrow -Will observe on Med Surg for now  Atrial fibrillation, on AC -Rate controlled on Toprol XL -Hold Xarelto due to bleeding  Chronic combined CHF/CAD -Appears euvolemic at this time, but will be getting IVF so will watch for evidence of volume overload -Hold diuretics (Lasix, spironolactone) -Continue Lipitor -s/p CABG -Recent EF had normalized  HTN -Continue Cozaar (takes BID, consider changing to once daily) and Toprol  COPD -She does not appear to be taking medication for this issue at this time  Stage 3 CKD -Appears to be at/slightly better than usual baseline creatinine of 1.4-1.5 -Will follow  Obesity -BMI 35.24 -Weight loss should be encouraged -Outpatient  PCP/bariatric medicine f/u encouraged  Tobacco dependence -She was previously reported to have quit in 2019.  Upon further questioning, she takes an "occasional puff or two."  Upon further questioning, this is back to daily use. -Encourage cessation.   -This was discussed with the patient and should be reviewed on an ongoing basis.   -Patch ordered    Note: This patient has been tested and is pending for the novel coronavirus COVID-19.  DVT prophylaxis: Xarelto Code Status:  Full - confirmed with patient Family Communication: None present Disposition Plan: She is anticipated to d/c to home without [facility] services once her GI issues have been resolved. Consults called: GI  Admission status: It is my clinical opinion that referral for OBSERVATION is reasonable and necessary in this patient based on the above information provided. The aforementioned taken together are felt to place the patient at high risk for further clinical deterioration. However it is anticipated that the patient may be medically stable for discharge from the hospital within 24 to 48 hours.     Karmen Bongo MD [facility]   How to contact the [facility] Attending or Consulting provider [place] or covering provider during after hours [place], for this patient?  1. Check the care team in [facility] and look for a) attending/consulting [facility] provider listed and b) the [facility] team listed 2. Log into [email or web address] and use [place]'s universal password to access. If you do not have the password, please contact the hospital operator. 3. Locate the [facility] provider you are looking for under [facility] and page to a  number that you can be directly reached. 4. If you still have difficulty reaching the provider, please page the [facility] (Director on Call) for the Hospitalists listed on amion for assistance.   02/10/2020, 9:53 AM

## 2020-02-10 NOTE — ED Triage Notes (Signed)
Pt reports she began to have some abdominal cramping last night.  She took "a big dose of pepto".  She woke up to have a bowel movement as straining and found some bright red blood on the paper.  Pt has hx of GI bleeds.

## 2020-02-10 NOTE — Progress Notes (Signed)
Md made aware that pt reported bright red blood when using bathroom. No BM reported. Staff did not observe any blood. Pt flushed.

## 2020-02-11 DIAGNOSIS — K922 Gastrointestinal hemorrhage, unspecified: Secondary | ICD-10-CM | POA: Diagnosis not present

## 2020-02-11 LAB — BASIC METABOLIC PANEL
Anion gap: 9 (ref 5–15)
BUN: 17 mg/dL (ref 8–23)
CO2: 20 mmol/L — ABNORMAL LOW (ref 22–32)
Calcium: 8.5 mg/dL — ABNORMAL LOW (ref 8.9–10.3)
Chloride: 113 mmol/L — ABNORMAL HIGH (ref 98–111)
Creatinine, Ser: 1.08 mg/dL — ABNORMAL HIGH (ref 0.44–1.00)
GFR calc Af Amer: 58 mL/min — ABNORMAL LOW (ref >60)
GFR calc non Af Amer: 50 mL/min — ABNORMAL LOW (ref >60)
Glucose, Bld: 110 mg/dL — ABNORMAL HIGH (ref 70–99)
Potassium: 3.8 mmol/L (ref 3.5–5.1)
Sodium: 142 mmol/L (ref 135–145)

## 2020-02-11 LAB — CBC
HCT: 37.4 % (ref 36.0–46.0)
Hemoglobin: 11.2 g/dL — ABNORMAL LOW (ref 12.0–15.0)
MCH: 24.2 pg — ABNORMAL LOW (ref 26.0–34.0)
MCHC: 29.9 g/dL — ABNORMAL LOW (ref 30.0–36.0)
MCV: 81 fL (ref 80.0–100.0)
Platelets: 142 10*3/uL — ABNORMAL LOW (ref 150–400)
RBC: 4.62 MIL/uL (ref 3.87–5.11)
RDW: 18.1 % — ABNORMAL HIGH (ref 11.5–15.5)
WBC: 7.1 10*3/uL (ref 4.0–10.5)
nRBC: 0 % (ref 0.0–0.2)

## 2020-02-11 MED ORDER — PANTOPRAZOLE SODIUM 40 MG PO TBEC: 40.0000 mg | DELAYED_RELEASE_TABLET | Freq: Every day | ORAL | 0 refills | Status: DC

## 2020-02-11 MED ORDER — NICOTINE 7 MG/24HR TD PT24: 7.0000 mg | MEDICATED_PATCH | Freq: Every day | TRANSDERMAL | 0 refills | Status: DC

## 2020-02-11 MED ORDER — BISACODYL 10 MG RE SUPP
10.0000 mg | Freq: Once | RECTAL | Status: AC
  Administered 2020-02-11: 10 mg via RECTAL
  Filled 2020-02-11: qty 1

## 2020-02-11 NOTE — Discharge Instructions (Addendum)
Rectal Bleeding  Rectal bleeding is when blood comes out of the opening of the butt (anus). People with this kind of bleeding may notice bright red blood in their underwear or in the toilet after they poop (have a bowel movement). They may also have dark red or black poop (stool). Rectal bleeding is often a sign that something is wrong. It needs to be checked by a doctor. Follow these instructions at home: Watch for any changes in your condition. Take these actions to help with bleeding and discomfort:  Eat a diet that is high in fiber. This will keep your poop soft so it is easier for you to poop without pushing too hard. Ask your doctor to tell you what foods and drinks are high in fiber.  Drink enough fluid to keep your pee (urine) clear or pale yellow. This also helps keep your poop soft.  Try taking a warm bath. This may help with pain.  Keep all follow-up visits as told by your doctor. This is important. Get help right away if:  You have new bleeding.  You have more bleeding than before.  You have black or dark red poop.  You throw up (vomit) blood or something that looks like coffee grounds.  You have pain or tenderness in your belly (abdomen).  You have a fever.  You feel weak.  You feel sick to your stomach (nauseous).  You pass out (faint).  You have very bad pain in your butt.  You cannot poop. This information is not intended to replace advice given to you by your health care provider. Make sure you discuss any questions you have with your health care provider. Document Revised: 07/14/2017 Document Reviewed: 09/27/2015 Elsevier Patient Education  2020 [place] on my medicine - XARELTO (Rivaroxaban)  This medication education was reviewed with me or my healthcare representative as part of my discharge preparation.  The pharmacist that spoke with me during my hospital stay was:  Onnie Boer, RPH-CPP  Why was Xarelto prescribed for  you? Xarelto was prescribed for you to reduce the risk of a blood clot forming that can cause a stroke if you have a medical condition called atrial fibrillation (a type of irregular heartbeat).  What do you need to know about xarelto ? Take your Xarelto ONCE DAILY at the same time every day with your evening meal. If you have difficulty swallowing the tablet whole, you may crush it and mix in applesauce just prior to taking your dose.  Take Xarelto exactly as prescribed by your doctor and DO NOT stop taking Xarelto without talking to the doctor who prescribed the medication.  Stopping without other stroke prevention medication to take the place of Xarelto may increase your risk of developing a clot that causes a stroke.  Refill your prescription before you run out.  After discharge, you should have regular check-up appointments with your healthcare provider that is prescribing your Xarelto.  In the future your dose may need to be changed if your kidney function or weight changes by a significant amount.  What do you do if you miss a dose? If you are taking Xarelto ONCE DAILY and you miss a dose, take it as soon as you remember on the same day then continue your regularly scheduled once daily regimen the next day. Do not take two doses of Xarelto at the same time or on the same day.   Important Safety Information A possible side effect of Xarelto is  bleeding. You should call your healthcare provider right away if you experience any of the following: ? Bleeding from an injury or your nose that does not stop. ? Unusual colored urine (red or dark brown) or unusual colored stools (red or black). ? Unusual bruising for unknown reasons. ? A serious fall or if you hit your head (even if there is no bleeding).  Some medicines may interact with Xarelto and might increase your risk of bleeding while on Xarelto. To help avoid this, consult your healthcare provider or pharmacist prior to using  any new prescription or non-prescription medications, including herbals, vitamins, non-steroidal anti-inflammatory drugs (NSAIDs) and supplements.  This website has more information on Xarelto: [email or web address]-[email or web address].[email or web address]

## 2020-02-11 NOTE — Progress Notes (Signed)
Discharge instructions reviewed with pt.  Copy of instructions given to pt, pt informed of scripts sent to her pharmacy. Pt waiting for her ride to arrive to hospital, they will call her when at the main entrance.   At 1223, pt's ride here.  Pt d/c'd via wheelchair with belongings.         Escorted by unit NT.

## 2020-02-11 NOTE — Discharge Summary (Signed)
Discharge Summary  [name] [name]:[id number] DOB: 07-12-1946  PCP: [name], MD  Admit date: 02/10/2020 Discharge date: 02/11/2020  Time spent: 35 minutes  Recommendations for Outpatient Follow-up:  1. Follow-up with GI 2. Follow-up with PCP 3. Take your medications as prescribed  Discharge Diagnoses:  Active Hospital Problems   Diagnosis Date Noted  . Acute lower GI bleeding 02/10/2020  . Tobacco dependence 02/10/2020  . Obesity, Class II, BMI 35-39.9 02/10/2020  . COPD (chronic obstructive pulmonary disease) ([facility]) 10/26/2019  . Paroxysmal atrial fibrillation ([facility]) 02/08/2017  . CKD (chronic kidney disease), stage III 01/30/2017  . Accelerated hypertension   . Chronic combined systolic and diastolic CHF (congestive heart failure) ([facility]) 10/30/2014    Resolved Hospital Problems   Diagnosis Date Noted Date Resolved  . Atrial fibrillation ([facility]) 08/04/2017 02/10/2020    Discharge Condition: Stable  Diet recommendation: Resume previous diet  Vitals:   02/10/20 2323 02/11/20 0532  BP: 132/64 130/73  Pulse: (!) 56 (!) 56  Resp: 18 18  Temp: 97.7 F (36.5 C) 97.6 F (36.4 C)  SpO2: 96% 100%    History of present illness:  [name] [name] a 74 y.o.femalewith medical history significant ofCAD s/p CABG; afib on Xarelto; HTN; COPD; stage 3 CKD; and chronic combined CHF presenting with recurrent bleeding. She was previously hospitalized from 2/23-26 for UGI bleeding. EGD was performed and did not show a bleeding source. Colonoscopy was performed with diverticulosis and a few polyps with internal hemorrhoids, but also without active bleeding. EGD was performed again for recurrent bleeding and there was no bleeding noted; a 6 cm hiatal hernia and widely patent Schatzki ring were present and a capsule enteroscope was placed.  Capsule endoscopy showed minimal gastritis; a proximal duodenal ulcer with bleeding; and a tiny nonbleeding AVM.  Xarelto was held for 3 more  days; she was started on PPI BID; and she was told to avoid NSAIDs.  Yesterday, she got up and wasn't feeling very well.  She thought maybe she as anemic.  She had coffee and went to the store.  She started cooking dinner but was feeling sort of sick.  She developed abdominal pain and felt like she needed to poop.  She felt like she was going to pass out, severe abdominal cramps.  She took Pepto and that calmed her stomach down for about 3 hours.  She went to eat a little something.  She developed recurrent abdominal pain about midnight.  She wasn't able to poop so she laid down and had severe cramps.  She tried again - unable to make stool but she had BRBPR.  She is not having BMs but is having blood in the commode (x 1) and on the tissue (x3-4).  Her cramping is a little better but still ongoing.  Seen by GI, no planned procedures, signed off on 02/10/2020.  Will follow up outpatient.  02/11/20: Patient was seen and examined at her bedside.  She denies any bowel movement today.  States she is constipated and requesting a suppository.  Dulcolax suppository given x1 dose.  No new complaints.  Patient advised to follow-up with her gastroenterologist posthospitalization.  Patient understands and agrees to plan.   Hospital Course:  Principal Problem:   Acute lower GI bleeding Active Problems:   Chronic combined systolic and diastolic CHF (congestive heart failure) (H[facility])   Accelerated hypertension   CKD (chronic kidney disease), stage III   Paroxysmal atrial fibrillation ([facility])   COPD (chronic obstructive pulmonary disease) (  [place])   Tobacco dependence   Obesity, Class II, BMI 35-39.9  Acute lower GI bleeding, no bowel movement, no reported GI bleed at the time of this visit. -Patient with prior recent admissions for bleeding with eventual diagnosis of bleeding duodenal ulcer -She has been stable and then developed acute abdominal cramping the day prior to presentation followed by 1 episode of  BRBPR in the commode and several more on toilet paper -Appears to be mild bleeding, likely hemorrhoidal in nature -Hgb is normal/stable -Continue Protonix daily, per GI -GI signed off, follow-up outpatient  Atrial fibrillation, on AC -Rate controlled on Toprol XL -Continue Xarelto for CVA prevention  Chronic combined chronic diastolic and systolic CHF/CAD -Appears euvolemic on exam -Denies any anginal symptoms -s/p CABG -Recent EF had normalized -Resume cardiac medications  HTN -Blood pressure is at goal -Continue Cozaar (takes BID, consider changing to once daily) and Toprol  COPD -Stable -She does not appear to be taking medication for this issue at this time  Stage 3 CKD -Appears to be at/slightly better than usual baseline creatinine of 1.4-1.5 Follow-up with your PCP  Obesity -BMI 35.24 -Weight loss should be encouraged Follow-up with your PCP  Tobacco dependence -She was previously reported to have quit in 2019.  Upon further questioning, she takes an "occasional puff or two."  Upon further questioning, this is back to daily use. -Encourage cessation.   -This was discussed with the patient and should be reviewed on an ongoing basis.   Follow-up with your PCP   Code Status:Full - confirmed with patient  Consults called:GI   Discharge Exam: BP 130/73 (BP Location: Left Arm)   Pulse (!) 56   Temp 97.6 F (36.4 C) (Oral)   Resp 18   Ht 5\' 7"  (1.702 m)   Wt 102.1 kg   SpO2 100%   BMI 35.24 kg/m  . General: 74 y.o. year-old female well developed well nourished in no acute distress.  Alert and oriented x3. . Cardiovascular: Regular rate and rhythm with no rubs or gallops.  No thyromegaly or JVD noted.   Marland Kitchen Respiratory: Clear to auscultation with no wheezes or rales. Good inspiratory effort. . Abdomen: Soft nontender nondistended with normal bowel sounds x4 quadrants. . Musculoskeletal: No lower extremity edema. 2/4 pulses in all 4  extremities. Marland Kitchen Psychiatry: Mood is appropriate for condition and setting  Discharge Instructions You were cared for by a hospitalist during your hospital stay. If you have any questions about your discharge medications or the care you received while you were in the hospital after you are discharged, you can call the unit and asked to speak with the hospitalist on call if the hospitalist that took care of you is not available. Once you are discharged, your primary care physician will handle any further medical issues. Please note that NO REFILLS for any discharge medications will be authorized once you are discharged, as it is imperative that you return to your primary care physician (or establish a relationship with a primary care physician if you do not have one) for your aftercare needs so that they can reassess your need for medications and monitor your lab values.   Allergies as of 02/11/2020      Reactions   Bee Venom Anaphylaxis   Ivp Dye [iodinated Diagnostic Agents] Anaphylaxis   Codeine Nausea And Vomiting   Entresto [sacubitril-valsartan]    Chest pain    Shrimp [shellfish Allergy] Swelling      Medication List  TAKE these medications   acetaminophen 500 MG tablet Commonly known as: TYLENOL Take 500 mg by mouth every 6 (six) hours as needed for moderate pain.   allopurinol 300 MG tablet Commonly known as: ZYLOPRIM Take 1 tablet (300 mg total) by mouth daily.   atorvastatin 10 MG tablet Commonly known as: LIPITOR Take 10 mg by mouth daily.   furosemide 20 MG tablet Commonly known as: Lasix Take 1 tablet (20 mg total) by mouth daily.   losartan 25 MG tablet Commonly known as: COZAAR Take 1 tablet (25 mg total) by mouth 2 (two) times daily.   metoprolol succinate 50 MG 24 hr tablet Commonly known as: TOPROL-XL TAKE ONE TABLET BY MOUTH ONCE DAILY WITH OR immediately following A meal What changed: See the new instructions.   nicotine 7 mg/24hr patch Commonly known  as: NICODERM CQ - dosed in mg/24 hr Place 1 patch (7 mg total) onto the skin daily.   pantoprazole 40 MG tablet Commonly known as: PROTONIX Take 1 tablet (40 mg total) by mouth daily.   rivaroxaban 20 MG Tabs tablet Commonly known as: XARELTO Take 1 tablet (20 mg total) by mouth daily with supper. Resume from 11/01/2019   senna-docusate 8.6-50 MG tablet Commonly known as: Senokot-S Take 1 tablet by mouth 2 (two) times daily. What changed:   when to take this  reasons to take this   spironolactone 25 MG tablet Commonly known as: ALDACTONE Take 1 tablet (25 mg total) by mouth daily. What changed: how much to take   triamcinolone ointment 0.5 % Commonly known as: KENALOG Apply 1 application topically 2 (two) times daily as needed (itching, rash).      Allergies  Allergen Reactions  . Bee Venom Anaphylaxis  . Ivp Dye [Iodinated Diagnostic Agents] Anaphylaxis  . Codeine Nausea And Vomiting  . Entresto [Sacubitril-Valsartan]     Chest pain   . Shrimp [Shellfish Allergy] Swelling    Follow-up Information    [name], MD. Call in 1 day(s).   Specialty: Internal Medicine Why: Call for a post hospital follow up appointment Contact information: [address] [place] [address] [phone]        Jerline Pain, MD .   Specialty: Cardiology Contact information: [id number] [place] [address] [phone]        Arta Silence, MD. Call in 1 day(s).   Specialty: Gastroenterology Why: please call for a post hospital follow up appointment Contact information: [address] [place] [address] [phone]                The results of significant diagnostics from this hospitalization (including imaging, microbiology, ancillary and laboratory) are listed below for reference.    Significant Diagnostic Studies: CT ABDOMEN PELVIS WO CONTRAST  Result Date: 02/10/2020 CLINICAL DATA:  Lower  abdominal pain with cramping and rectal bleeding since Friday EXAM: CT ABDOMEN AND PELVIS WITHOUT CONTRAST TECHNIQUE: Multidetector CT imaging of the abdomen and pelvis was performed following the standard protocol without IV contrast. COMPARISON:  02/12/2009 FINDINGS: Lower chest:  Partially covered cardiomegaly. Hepatobiliary: No focal liver abnormality.No evidence of biliary obstruction or stone. Pancreas: Unremarkable. Spleen: Unremarkable. Adrenals/Urinary Tract: Negative adrenals. No hydronephrosis or stone. Asymmetric left renal atrophy. Unremarkable bladder. Stomach/Bowel: No obstruction. There is mild stranding around the appendix which is nondilated and primarily contains gas, likely incidental/scarring. Few left colonic diverticula. Vascular/Lymphatic: No acute vascular abnormality. Aortic and branch vessel atherosclerosis. No  mass or adenopathy. Reproductive:Fibroid uterus with a calcified central uterine fibroid measuring 5 cm. There is a right adnexal intermediate density mass measuring 5 cm, likely present in 2010, although larger. This favors an exophytic right ovarian fibroid and was noted on a pelvic ultrasound from 2015. Other: No ascites or pneumoperitoneum. Musculoskeletal: No acute abnormalities. Spinal degeneration, most notably at L4-5 bulging disc which narrows the foramina and canal. IMPRESSION: 1. No acute finding. 2. Mild colonic diverticulosis. 3.  Aortic Atherosclerosis (ICD10-I70.0). 4. Fibroid uterus. Electronically Signed   By: Monte Fantasia M.D.   On: 02/10/2020 07:24    Microbiology: Recent Results (from the past 240 hour(s))  SARS Coronavirus 2 by RT PCR (hospital order, performed in [facility] hospital lab) Nasopharyngeal Nasopharyngeal Swab     Status: None   Collection Time: 02/10/20  6:26 AM   Specimen: Nasopharyngeal Swab  Result Value Ref Range Status   SARS Coronavirus 2 NEGATIVE NEGATIVE Final    Comment: (NOTE) SARS-CoV-2 target nucleic acids are NOT  DETECTED.  The SARS-CoV-2 RNA is generally detectable in upper and lower respiratory specimens during the acute phase of infection. The lowest concentration of SARS-CoV-2 viral copies this assay can detect is 250 copies / mL. A negative result does not preclude SARS-CoV-2 infection and should not be used as the sole basis for treatment or other patient management decisions.  A negative result may occur with improper specimen collection / handling, submission of specimen other than nasopharyngeal swab, presence of viral mutation(s) within the areas targeted by this assay, and inadequate number of viral copies (<250 copies / mL). A negative result must be combined with clinical observations, patient history, and epidemiological information.  Fact Sheet for Patients:   StrictlyIdeas.no  Fact Sheet for Healthcare Providers: BankingDealers.co.za  This test is not yet approved or  cleared by the [place] FDA and has been authorized for detection and/or diagnosis of SARS-CoV-2 by FDA under an Emergency Use Authorization (EUA).  This EUA will remain in effect (meaning this test can be used) for the duration of the COVID-19 declaration under Section 564(b)(1) of the Act, 21 U.S.C. section 360bbb-3(b)(1), unless the authorization is terminated or revoked sooner.  Performed at [facility], [place] [address] [place], [place] [address]      Labs: Basic Metabolic Panel: Recent Labs  Lab 02/10/20 0444 02/11/20 0622  NA 143 142  K 3.5 3.8  CL 114* 113*  CO2 19* 20*  GLUCOSE 120* 110*  BUN 16 17  CREATININE 1.13* 1.08*  CALCIUM 8.9 8.5*   Liver Function Tests: Recent Labs  Lab 02/10/20 0444  AST 13*  ALT 11  ALKPHOS 136*  BILITOT 0.5  PROT 6.9  ALBUMIN 3.4*   No results for input(s): LIPASE, AMYLASE in the last 168 hours. No results for input(s): AMMONIA in the last 168 hours. CBC: Recent Labs  Lab 02/10/20 0444  02/10/20 1527 02/11/20 0622  WBC 10.0 7.9 7.1  HGB 13.0 12.5 11.2*  HCT 43.2 42.3 37.4  MCV 79.7* 81.8 81.0  PLT 165 130* 142*   Cardiac Enzymes: No results for input(s): CKTOTAL, CKMB, CKMBINDEX, TROPONINI in the last 168 hours. BNP: BNP (last 3 results) No results for input(s): BNP in the last 8760 hours.  ProBNP (last 3 results) No results for input(s): PROBNP in the last 8760 hours.  CBG: No results for input(s): GLUCAP in the last 168 hours.     Signed:  Kayleen Memos, MD [facility] 02/11/2020, 11:14  AM

## 2020-02-12 DIAGNOSIS — M1990 Unspecified osteoarthritis, unspecified site: Secondary | ICD-10-CM | POA: Diagnosis not present

## 2020-02-12 DIAGNOSIS — M8580 Other specified disorders of bone density and structure, unspecified site: Secondary | ICD-10-CM | POA: Diagnosis not present

## 2020-02-12 DIAGNOSIS — I25810 Atherosclerosis of coronary artery bypass graft(s) without angina pectoris: Secondary | ICD-10-CM | POA: Diagnosis not present

## 2020-02-12 DIAGNOSIS — I5020 Unspecified systolic (congestive) heart failure: Secondary | ICD-10-CM | POA: Diagnosis not present

## 2020-02-12 DIAGNOSIS — N183 Chronic kidney disease, stage 3 unspecified: Secondary | ICD-10-CM | POA: Diagnosis not present

## 2020-02-12 DIAGNOSIS — I10 Essential (primary) hypertension: Secondary | ICD-10-CM | POA: Diagnosis not present

## 2020-02-12 DIAGNOSIS — J449 Chronic obstructive pulmonary disease, unspecified: Secondary | ICD-10-CM | POA: Diagnosis not present

## 2020-02-12 DIAGNOSIS — E7800 Pure hypercholesterolemia, unspecified: Secondary | ICD-10-CM | POA: Diagnosis not present

## 2020-02-12 DIAGNOSIS — I214 Non-ST elevation (NSTEMI) myocardial infarction: Secondary | ICD-10-CM | POA: Diagnosis not present

## 2020-02-12 DIAGNOSIS — I4891 Unspecified atrial fibrillation: Secondary | ICD-10-CM | POA: Diagnosis not present

## 2020-02-12 DIAGNOSIS — I509 Heart failure, unspecified: Secondary | ICD-10-CM | POA: Diagnosis not present

## 2020-02-20 ENCOUNTER — Telehealth: Payer: Self-pay | Admitting: Cardiology

## 2020-02-20 MED ORDER — FUROSEMIDE 20 MG PO TABS: 20.0000 mg | ORAL_TABLET | Freq: Every day | ORAL | 2 refills | Status: DC

## 2020-02-20 NOTE — Telephone Encounter (Signed)
*  STAT* If patient is at the pharmacy, call can be transferred to refill team.   1. Which medications need to be refilled? (please list name of each medication and dose if known)  furosemide (LASIX) 20 MG tablet(Expired)  2. Which pharmacy/location (including street and city if local pharmacy) is medication to be sent to? [facility] - [place], [place] - [place] [address]  3. Do they need a 30 day or 90 day supply? 90 day supply  Patient is out of medication and needs to pick up today.

## 2020-02-20 NOTE — Telephone Encounter (Signed)
Pt's medication was sent to pt's pharmacy as requested. Confirmation received.  °

## 2020-03-09 ENCOUNTER — Encounter: Payer: Self-pay | Admitting: Cardiology

## 2020-03-09 ENCOUNTER — Ambulatory Visit (INDEPENDENT_AMBULATORY_CARE_PROVIDER_SITE_OTHER): Payer: HMO | Admitting: Cardiology

## 2020-03-09 ENCOUNTER — Other Ambulatory Visit: Payer: Self-pay

## 2020-03-09 VITALS — BP 118/70 | HR 61 | Ht 67.0 in | Wt 215.0 lb

## 2020-03-09 DIAGNOSIS — I10 Essential (primary) hypertension: Secondary | ICD-10-CM | POA: Diagnosis not present

## 2020-03-09 DIAGNOSIS — Z79899 Other long term (current) drug therapy: Secondary | ICD-10-CM

## 2020-03-09 DIAGNOSIS — I25810 Atherosclerosis of coronary artery bypass graft(s) without angina pectoris: Secondary | ICD-10-CM | POA: Diagnosis not present

## 2020-03-09 DIAGNOSIS — Z951 Presence of aortocoronary bypass graft: Secondary | ICD-10-CM

## 2020-03-09 NOTE — Progress Notes (Signed)
Cardiology Office Note:    Date:  03/09/2020   ID:  [name] [name], [name] [name] 10-Dec-1945, MRN [id number]  PCP:  [name], MD  [facility] Cardiologist:  [name], MD  [facility] Electrophysiologist:  None   Referring MD: [name], MD     History of Present Illness:    [name] [name] is a 74 y.o. female here for follow-up of coronary disease with ischemic cardiomyopathy chronic systolic heart failure EF 30 to 35%.  Also has paroxysmal atrial fibrillation.  Has had medication noncompliance.  She has had upper GI bleeding.  EGD performed showed widely patent Schatzki's ring.  Capsule endoscopy showed minimal gastritis.  Proximal duodenal ulcer with bleeding and a tiny tiny nonbleeding AVM was also noted.  Xarelto was held during hospitalization.  She is back on.  Started on PPI twice daily.  Pantoprazole  She feels atrial fibrillation at times.  She feels fatigued at times.    Past Medical History:  Diagnosis Date  . Arthritis   . Asthma    ??  . Atopic dermatitis   . Cardiac arrest ([place]) 10/2014   [name]  . Cardiomyopathy, ischemic    a. EF previously low, improved to LVEF 50-55% as of May 2018  . Chronic combined systolic and diastolic heart failure ([place])   . CKD (chronic kidney disease), stage III   . COPD (chronic obstructive pulmonary disease) ([place])   . Esophageal dilatation 2013  . GERD (gastroesophageal reflux disease)   . Gout   . Headache   . Hypertension   . Hypokalemia   . Idiopathic angioedema   . LGI bleed 08/06/2017   a. felt to be hemorrhoidal during that admission (no drop in Hgb).  . Lower back pain   . Persistent atrial fibrillation (HCC)    a. h/o difficult to control rates (complicated by noncompliance), not felt to be a candidate for ablation or antiarrhythmic due to noncompliance.  . Personal history of noncompliance with medical treatment, presenting hazards to health   . Prediabetes   . S/P CABG x 2 with clipping of LA  appendage 11/14/2014   LIMA to LAD, SVG to OM, EVH via right thigh  . Urine incontinence   . Uterine fibroid     Past Surgical History:  Procedure Laterality Date  . BALLOON DILATION N/A 10/10/2019   Procedure: BALLOON DILATION;  Surgeon: Otis Brace, MD;  Location: [facility];  Service: Gastroenterology;  Laterality: N/A;  . BIOPSY  10/10/2019   Procedure: BIOPSY;  Surgeon: Otis Brace, MD;  Location: [place];  Service: Gastroenterology;;  . BIOPSY  10/11/2019   Procedure: BIOPSY;  Surgeon: Otis Brace, MD;  Location: [place];  Service: Gastroenterology;;  . CARDIOVERSION N/A 11/18/2014   Procedure: CARDIOVERSION;  Surgeon: [name], MD;  Location: [place];  Service: Cardiovascular;  Laterality: N/A;  . CARDIOVERSION N/A 12/25/2017   Procedure: CARDIOVERSION;  Surgeon: [name], MD;  Location: [place];  Service: Cardiovascular;  Laterality: N/A;  . CLIPPING OF ATRIAL APPENDAGE N/A 11/14/2014   Procedure: CLIPPING OF ATRIAL APPENDAGE;  Surgeon: [name], MD;  Location: [place];  Service: Open Heart Surgery;  Laterality: N/A;  . COLONOSCOPY  2013  . COLONOSCOPY WITH PROPOFOL N/A 10/11/2019   Procedure: COLONOSCOPY WITH PROPOFOL;  Surgeon: Otis Brace, MD;  Location: [place];  Service: Gastroenterology;  Laterality: N/A;  . CORONARY ARTERY BYPASS GRAFT N/A 11/14/2014   Procedure: CORONARY ARTERY BYPASS GRAFTING (CABG)TIMES 2 USING LEFT  INTERNAL MAMMARY ARTERY AND RIGHT SAPHENOUS VEIN HARVESTED ENDOSCOPICALLY;  Surgeon: [name], MD;  Location: [place];  Service: Open Heart Surgery;  Laterality: N/A;  . ESOPHAGOGASTRODUODENOSCOPY (EGD) WITH PROPOFOL N/A 10/10/2019   Procedure: ESOPHAGOGASTRODUODENOSCOPY (EGD) WITH PROPOFOL;  Surgeon: Otis Brace, MD;  [facility]: [facility];  Service: Gastroenterology;  Laterality: N/A;  . ESOPHAGOGASTRODUODENOSCOPY (EGD) WITH PROPOFOL N/A 10/27/2019   Procedure: ESOPHAGOGASTRODUODENOSCOPY (EGD)  WITH PROPOFOL;  Surgeon: Ronnette Juniper, MD;  Location: [place];  Service: Gastroenterology;  Laterality: N/A;  . GIVENS CAPSULE STUDY N/A 10/27/2019   Procedure: GIVENS CAPSULE STUDY;  Surgeon: Ronnette Juniper, MD;  Location: [place];  Service: Gastroenterology;  Laterality: N/A;  . LEFT HEART CATHETERIZATION WITH CORONARY ANGIOGRAM N/A 11/04/2014   Procedure: LEFT HEART CATHETERIZATION WITH CORONARY ANGIOGRAM;  Surgeon: Troy Sine, MD;  Location: [facility] CATH LAB;  Service: Cardiovascular;  Laterality: N/A;  . POLYPECTOMY  10/11/2019   Procedure: POLYPECTOMY;  Surgeon: Otis Brace, MD;  Location: [place] [facility];  Service: Gastroenterology;;  . TEE WITHOUT CARDIOVERSION N/A 11/14/2014   Procedure: TRANSESOPHAGEAL ECHOCARDIOGRAM (TEE);  Surgeon: [name], MD;  Location: [place];  Service: Open Heart Surgery;  Laterality: N/A;  . TEE WITHOUT CARDIOVERSION N/A 12/25/2017   Procedure: TRANSESOPHAGEAL ECHOCARDIOGRAM (TEE);  Surgeon: [name], MD;  Location: [facility] ENDOSCOPY;  Service: Cardiovascular;  Laterality: N/A;  . TEMPORARY PACEMAKER INSERTION  11/04/2014   Procedure: TEMPORARY PACEMAKER INSERTION;  Surgeon: Troy Sine, MD;  Location: [facility] C[facility] LAB;  Service: Cardiovascular;;  . TOOTH EXTRACTION      Current Medications: Current Meds  Medication Sig  . acetaminophen (TYLENOL) 500 MG tablet Take 500 mg by mouth every 6 (six) hours as needed for moderate pain.   Marland Kitchen allopurinol (ZYLOPRIM) 300 MG tablet Take 1 tablet (300 mg total) by mouth daily.  Marland Kitchen atorvastatin (LIPITOR) 10 MG tablet Take 10 mg by mouth daily.  . furosemide (LASIX) 20 MG tablet Take 1 tablet (20 mg total) by mouth daily.  Marland Kitchen losartan (COZAAR) 25 MG tablet Take 1 tablet (25 mg total) by mouth 2 (two) times daily.  . metoprolol succinate (TOPROL-XL) 50 MG 24 hr tablet TAKE ONE TABLET BY MOUTH ONCE DAILY WITH OR immediately following A meal  . pantoprazole (PROTONIX) 40 MG tablet Take 1 tablet (40 mg total) by mouth  daily.  . rivaroxaban (XARELTO) 20 MG TABS tablet Take 1 tablet (20 mg total) by mouth daily with supper. Resume from 11/01/2019  . senna-docusate (SENOKOT-S) 8.6-50 MG tablet Take 1 tablet by mouth 2 (two) times daily.  Marland Kitchen spironolactone (ALDACTONE) 25 MG tablet Take 1 tablet (25 mg total) by mouth daily. (Patient taking differently: Take 12.5 mg by mouth daily. )  . triamcinolone ointment (KENALOG) 0.5 % Apply 1 application topically 2 (two) times daily as needed (itching, rash).      Allergies:   Bee venom, Ivp dye [iodinated diagnostic agents], Codeine, Entresto [sacubitril-valsartan], and Shrimp [shellfish allergy]   Social History   Socioeconomic History  . Marital status: Widowed    Spouse name: Not on file  . Number of children: 1  . Years of education: 11  . Highest education level: Not on file  Occupational History  . Occupation: RETIRED [employer]  Tobacco Use  . Smoking status: Current Some Day Smoker    Packs/day: 0.10    Years: 56.00    Pack years: 5.60    Types: Cigarettes  . Smokeless tobacco: Never Used  . Tobacco comment: "  I might still take a puff now and then"  Vaping Use  . Vaping Use: Never used  Substance and Sexual Activity  . Alcohol use: No    Alcohol/week: 0.0 standard drinks  . Drug use: No  . Sexual activity: Not on file  Other Topics Concern  . Not on file  Social History Narrative   Patient reports it being difficult to pay for everything due to outstanding medical bills from hospital stay last year but states she is able to keep up with basic expenses.  Patient does have some concerns with her house's condition due to a water leak- had her roof repaired last month but has some leaking in the front which has affected her porch.  Patient owns her own car and is able to drive herself but has some concerns about the reliability of her car- has gotten taxi to the clinic for appointment in the past when her car broke down.   Social Determinants  of Health   Financial Resource Strain:   . Difficulty of Paying Living Expenses:   Food Insecurity:   . Worried About Charity fundraiser in the Last Year:   . Arboriculturist in the Last Year:   Transportation Needs:   . Film/video editor (Medical):   Marland Kitchen Lack of Transportation (Non-Medical):   Physical Activity:   . Days of Exercise per Week:   . Minutes of Exercise per Session:   Stress:   . Feeling of Stress :   Social Connections:   . Frequency of Communication with Friends and Family:   . Frequency of Social Gatherings with Friends and Family:   . Attends Religious Services:   . Active Member of Clubs or Organizations:   . Attends Archivist Meetings:   Marland Kitchen Marital Status:      Family History: [name] family history includes CVA in her sister; Cancer in her mother; Diabetes in her brother; Heart disease in her father; Other in her father; Prostate cancer (age of onset: 45) in her brother.  ROS:   Please see the history of present illness.     All other systems reviewed and are negative.  EKGs/Labs/Other Studies Reviewed:    The following studies were reviewed today:  ECHO 11/2019   1. Left ventricular ejection fraction, by estimation, is 55 to 60%. The  left ventricle has normal function. The left ventricle demonstrates  regional wall motion abnormalities (see scoring diagram/findings for  description). There is moderate left  ventricular hypertrophy. Left ventricular diastolic parameters are  consistent with Grade II diastolic dysfunction (pseudonormalization).  Elevated left ventricular end-diastolic pressure.  2. Right ventricular systolic function is normal. The right ventricular  size is normal. Tricuspid regurgitation signal is inadequate for assessing  PA pressure.  3. Left atrial size was mildly dilated.  4. Right atrial size was mildly dilated.  5. The mitral valve is abnormal. Mild mitral valve regurgitation. No  evidence of  mitral stenosis.  6. The aortic valve is abnormal. Aortic valve regurgitation is mild. Mild  aortic valve sclerosis is present, with no evidence of aortic valve  stenosis.  7. The inferior vena cava is dilated in size with >50% respiratory  variability, suggesting right atrial pressure of 8 mmHg.   Recent Labs: 10/29/2019: Magnesium 1.9 02/10/2020: ALT 11 02/11/2020: BUN 17; Creatinine, Ser 1.08; Hemoglobin 11.2; Platelets 142; Potassium 3.8; Sodium 142  Recent Lipid Panel    Component Value Date/Time   CHOL 116 11/12/2014  0432   TRIG 111 11/12/2014 0432   HDL 34 (L) 11/12/2014 0432   CHOLHDL 3.4 11/12/2014 0432   VLDL 22 11/12/2014 0432   LDLCALC 60 11/12/2014 0432    Physical Exam:    VS:  BP 118/70   Pulse 61   Ht 5\' 7"  (1.702 m)   Wt (!) 215 lb (97.5 kg)   SpO2 97%   BMI 33.67 kg/m     Wt Readings from Last 3 Encounters:  03/09/20 (!) 215 lb (97.5 kg)  02/10/20 225 lb (102.1 kg)  12/02/19 227 lb 3.2 oz (103.1 kg)     GEN:  Well nourished, well developed in no acute distress HEENT: Normal NECK: No JVD; No carotid bruits LYMPHATICS: No lymphadenopathy CARDIAC: RRR, no murmurs, rubs, gallops RESPIRATORY:  Clear to auscultation without rales, wheezing or rhonchi  ABDOMEN: Soft, non-tender, non-distended MUSCULOSKELETAL:  No edema; No deformity  SKIN: Warm and dry NEUROLOGIC:  Alert and oriented x 3 PSYCHIATRIC:  Normal affect   ASSESSMENT:    1. Essential hypertension   2. Medication management    PLAN:    In order of problems listed above:  Paroxysmal atrial fibrillation -Symptomatic with this.  Occasionally will feel.  Continuing with Toprol.  Previously had been on amiodarone underwent cardioversion in 2019 but then she was back in rate controlled A. fib during apparent medicine visit.  Intolerant to [place] secondary to chest pain.  Chronic systolic heart failure -EF has returned to normal.  Excellent.  Continue with Toprol and Cozaar.  Low-dose  spironolactone.  12.5 mg a day.   Iron deficiency anemia -Small AVM duodenal ulcer noted.  Capsule endoscopy.  GI had been following.  She is now back on Xarelto.  Checking CBC today.  Dr. Lysle Rubens following.  Return to clinic in 3 months with Sharee Pimple, 6 months with me  Medication Adjustments/Labs and Tests Ordered: Current medicines are reviewed at length with the patient today.  Concerns regarding medicines are outlined above.  Orders Placed This Encounter  Procedures  . CBC  . Basic metabolic panel   No orders of the defined types were placed in this encounter.   Patient Instructions  Medication Instructions:  The current medical regimen is effective;  continue present plan and medications.  *If you need a refill on your cardiac medications before your next appointment, please call your pharmacy*  Lab Work: Please have blood work today (CBC, BMP)  If you have labs (blood work) drawn today and your tests are completely normal, you will receive your results only by: Marland Kitchen MyChart Message (if you have MyChart) OR . A paper copy in the mail If you have any lab test that is abnormal or we need to change your treatment, we will call you to review the results.  Follow-Up: At [facility], you and your health needs are our priority.  As part of our continuing mission to provide you with exceptional heart care, we have created designated Provider Care Teams.  These Care Teams include your primary Cardiologist (physician) and Advanced Practice Providers (APPs -  Physician Assistants and Nurse Practitioners) who all work together to provide you with the care you need, when you need it.  We recommend signing up for the patient portal called "MyChart".  Sign up information is provided on this After Visit Summary.  MyChart is used to connect with patients for Virtual Visits (Telemedicine).  Patients are able to view lab/test results, encounter notes, upcoming appointments, etc.  Non-urgent messages  can be sent to your provider as well.   To learn more about what you can do with MyChart, go to [email or web address].    Your next appointment:   3 month(s)  The format for your next appointment:   In Person  Provider:   Kathyrn Drown, NP    Thank you for choosing [facility]!!        Signed, [name], MD  03/09/2020 4:05 PM    [place]

## 2020-03-09 NOTE — Patient Instructions (Signed)
Medication Instructions:  The current medical regimen is effective;  continue present plan and medications.  *If you need a refill on your cardiac medications before your next appointment, please call your pharmacy*  Lab Work: Please have blood work today (CBC, BMP)  If you have labs (blood work) drawn today and your tests are completely normal, you will receive your results only by: Marland Kitchen MyChart Message (if you have MyChart) OR . A paper copy in the mail If you have any lab test that is abnormal or we need to change your treatment, we will call you to review the results.  Follow-Up: At [facility], you and your health needs are our priority.  As part of our continuing mission to provide you with exceptional heart care, we have created designated Provider Care Teams.  These Care Teams include your primary Cardiologist (physician) and Advanced Practice Providers (APPs -  Physician Assistants and Nurse Practitioners) who all work together to provide you with the care you need, when you need it.  We recommend signing up for the patient portal called "MyChart".  Sign up information is provided on this After Visit Summary.  MyChart is used to connect with patients for Virtual Visits (Telemedicine).  Patients are able to view lab/test results, encounter notes, upcoming appointments, etc.  Non-urgent messages can be sent to your provider as well.   To learn more about what you can do with MyChart, go to [email or web address].    Your next appointment:   3 month(s)  The format for your next appointment:   In Person  Provider:   Kathyrn Drown, NP    Thank you for choosing [facility]!!

## 2020-03-10 LAB — CBC
Hematocrit: 45.5 % (ref 34.0–46.6)
Hemoglobin: 13.9 g/dL (ref 11.1–15.9)
MCH: 24.3 pg — ABNORMAL LOW (ref 26.6–33.0)
MCHC: 30.5 g/dL — ABNORMAL LOW (ref 31.5–35.7)
MCV: 79 fL (ref 79–97)
Platelets: 167 10*3/uL (ref 150–450)
RBC: 5.73 x10E6/uL — ABNORMAL HIGH (ref 3.77–5.28)
RDW: 18.8 % — ABNORMAL HIGH (ref 11.7–15.4)
WBC: 9.1 10*3/uL (ref 3.4–10.8)

## 2020-03-10 LAB — BASIC METABOLIC PANEL
BUN/Creatinine Ratio: 16 (ref 12–28)
BUN: 17 mg/dL (ref 8–27)
CO2: 20 mmol/L (ref 20–29)
Calcium: 9.4 mg/dL (ref 8.7–10.3)
Chloride: 110 mmol/L — ABNORMAL HIGH (ref 96–106)
Creatinine, Ser: 1.08 mg/dL — ABNORMAL HIGH (ref 0.57–1.00)
GFR calc Af Amer: 58 mL/min/{1.73_m2} — ABNORMAL LOW (ref >59)
GFR calc non Af Amer: 50 mL/min/{1.73_m2} — ABNORMAL LOW (ref >59)
Glucose: 100 mg/dL — ABNORMAL HIGH (ref 65–99)
Potassium: 4 mmol/L (ref 3.5–5.2)
Sodium: 144 mmol/L (ref 134–144)

## 2020-03-13 DIAGNOSIS — I5020 Unspecified systolic (congestive) heart failure: Secondary | ICD-10-CM | POA: Diagnosis not present

## 2020-03-13 DIAGNOSIS — N183 Chronic kidney disease, stage 3 unspecified: Secondary | ICD-10-CM | POA: Diagnosis not present

## 2020-03-13 DIAGNOSIS — I2510 Atherosclerotic heart disease of native coronary artery without angina pectoris: Secondary | ICD-10-CM | POA: Diagnosis not present

## 2020-03-13 DIAGNOSIS — I10 Essential (primary) hypertension: Secondary | ICD-10-CM | POA: Diagnosis not present

## 2020-03-13 DIAGNOSIS — I214 Non-ST elevation (NSTEMI) myocardial infarction: Secondary | ICD-10-CM | POA: Diagnosis not present

## 2020-03-13 DIAGNOSIS — I509 Heart failure, unspecified: Secondary | ICD-10-CM | POA: Diagnosis not present

## 2020-03-13 DIAGNOSIS — J449 Chronic obstructive pulmonary disease, unspecified: Secondary | ICD-10-CM | POA: Diagnosis not present

## 2020-03-13 DIAGNOSIS — I4891 Unspecified atrial fibrillation: Secondary | ICD-10-CM | POA: Diagnosis not present

## 2020-03-13 DIAGNOSIS — E7800 Pure hypercholesterolemia, unspecified: Secondary | ICD-10-CM | POA: Diagnosis not present

## 2020-03-13 DIAGNOSIS — M1990 Unspecified osteoarthritis, unspecified site: Secondary | ICD-10-CM | POA: Diagnosis not present

## 2020-03-13 DIAGNOSIS — M8580 Other specified disorders of bone density and structure, unspecified site: Secondary | ICD-10-CM | POA: Diagnosis not present

## 2020-03-13 DIAGNOSIS — I25810 Atherosclerosis of coronary artery bypass graft(s) without angina pectoris: Secondary | ICD-10-CM | POA: Diagnosis not present

## 2020-03-18 DIAGNOSIS — K269 Duodenal ulcer, unspecified as acute or chronic, without hemorrhage or perforation: Secondary | ICD-10-CM | POA: Diagnosis not present

## 2020-03-18 DIAGNOSIS — I10 Essential (primary) hypertension: Secondary | ICD-10-CM | POA: Diagnosis not present

## 2020-03-18 DIAGNOSIS — Z1389 Encounter for screening for other disorder: Secondary | ICD-10-CM | POA: Diagnosis not present

## 2020-03-18 DIAGNOSIS — G4733 Obstructive sleep apnea (adult) (pediatric): Secondary | ICD-10-CM | POA: Diagnosis not present

## 2020-03-18 DIAGNOSIS — I25810 Atherosclerosis of coronary artery bypass graft(s) without angina pectoris: Secondary | ICD-10-CM | POA: Diagnosis not present

## 2020-03-18 DIAGNOSIS — K219 Gastro-esophageal reflux disease without esophagitis: Secondary | ICD-10-CM | POA: Diagnosis not present

## 2020-03-18 DIAGNOSIS — Z79899 Other long term (current) drug therapy: Secondary | ICD-10-CM | POA: Diagnosis not present

## 2020-03-18 DIAGNOSIS — I4891 Unspecified atrial fibrillation: Secondary | ICD-10-CM | POA: Diagnosis not present

## 2020-03-18 DIAGNOSIS — M8580 Other specified disorders of bone density and structure, unspecified site: Secondary | ICD-10-CM | POA: Diagnosis not present

## 2020-03-18 DIAGNOSIS — M109 Gout, unspecified: Secondary | ICD-10-CM | POA: Diagnosis not present

## 2020-03-18 DIAGNOSIS — R7309 Other abnormal glucose: Secondary | ICD-10-CM | POA: Diagnosis not present

## 2020-03-18 DIAGNOSIS — N183 Chronic kidney disease, stage 3 unspecified: Secondary | ICD-10-CM | POA: Diagnosis not present

## 2020-03-18 DIAGNOSIS — Z0000 Encounter for general adult medical examination without abnormal findings: Secondary | ICD-10-CM | POA: Diagnosis not present

## 2020-03-18 DIAGNOSIS — Z720 Tobacco use: Secondary | ICD-10-CM | POA: Diagnosis not present

## 2020-03-18 DIAGNOSIS — M1990 Unspecified osteoarthritis, unspecified site: Secondary | ICD-10-CM | POA: Diagnosis not present

## 2020-03-19 ENCOUNTER — Ambulatory Visit: Payer: Self-pay

## 2020-04-07 DIAGNOSIS — Z862 Personal history of diseases of the blood and blood-forming organs and certain disorders involving the immune mechanism: Secondary | ICD-10-CM | POA: Diagnosis not present

## 2020-04-07 DIAGNOSIS — Z86010 Personal history of colonic polyps: Secondary | ICD-10-CM | POA: Diagnosis not present

## 2020-04-07 DIAGNOSIS — I4891 Unspecified atrial fibrillation: Secondary | ICD-10-CM | POA: Diagnosis not present

## 2020-04-07 DIAGNOSIS — K269 Duodenal ulcer, unspecified as acute or chronic, without hemorrhage or perforation: Secondary | ICD-10-CM | POA: Diagnosis not present

## 2020-04-07 DIAGNOSIS — K3189 Other diseases of stomach and duodenum: Secondary | ICD-10-CM | POA: Diagnosis not present

## 2020-04-07 DIAGNOSIS — R748 Abnormal levels of other serum enzymes: Secondary | ICD-10-CM | POA: Diagnosis not present

## 2020-04-07 DIAGNOSIS — Z7689 Persons encountering health services in other specified circumstances: Secondary | ICD-10-CM | POA: Diagnosis not present

## 2020-04-14 DIAGNOSIS — I5020 Unspecified systolic (congestive) heart failure: Secondary | ICD-10-CM | POA: Diagnosis not present

## 2020-04-14 DIAGNOSIS — I2510 Atherosclerotic heart disease of native coronary artery without angina pectoris: Secondary | ICD-10-CM | POA: Diagnosis not present

## 2020-04-14 DIAGNOSIS — I509 Heart failure, unspecified: Secondary | ICD-10-CM | POA: Diagnosis not present

## 2020-04-14 DIAGNOSIS — E7800 Pure hypercholesterolemia, unspecified: Secondary | ICD-10-CM | POA: Diagnosis not present

## 2020-04-14 DIAGNOSIS — N183 Chronic kidney disease, stage 3 unspecified: Secondary | ICD-10-CM | POA: Diagnosis not present

## 2020-04-14 DIAGNOSIS — I4891 Unspecified atrial fibrillation: Secondary | ICD-10-CM | POA: Diagnosis not present

## 2020-04-14 DIAGNOSIS — M8580 Other specified disorders of bone density and structure, unspecified site: Secondary | ICD-10-CM | POA: Diagnosis not present

## 2020-04-14 DIAGNOSIS — J449 Chronic obstructive pulmonary disease, unspecified: Secondary | ICD-10-CM | POA: Diagnosis not present

## 2020-04-14 DIAGNOSIS — I214 Non-ST elevation (NSTEMI) myocardial infarction: Secondary | ICD-10-CM | POA: Diagnosis not present

## 2020-04-14 DIAGNOSIS — I10 Essential (primary) hypertension: Secondary | ICD-10-CM | POA: Diagnosis not present

## 2020-04-16 DIAGNOSIS — Z01419 Encounter for gynecological examination (general) (routine) without abnormal findings: Secondary | ICD-10-CM | POA: Diagnosis not present

## 2020-04-16 DIAGNOSIS — B379 Candidiasis, unspecified: Secondary | ICD-10-CM | POA: Diagnosis not present

## 2020-04-27 DIAGNOSIS — H269 Unspecified cataract: Secondary | ICD-10-CM | POA: Diagnosis not present

## 2020-04-27 DIAGNOSIS — R519 Headache, unspecified: Secondary | ICD-10-CM | POA: Diagnosis not present

## 2020-04-27 DIAGNOSIS — E1122 Type 2 diabetes mellitus with diabetic chronic kidney disease: Secondary | ICD-10-CM | POA: Diagnosis not present

## 2020-05-04 ENCOUNTER — Other Ambulatory Visit: Payer: Self-pay

## 2020-05-04 ENCOUNTER — Encounter (HOSPITAL_COMMUNITY): Payer: Self-pay | Admitting: *Deleted

## 2020-05-04 ENCOUNTER — Emergency Department (HOSPITAL_COMMUNITY)
Admission: EM | Admit: 2020-05-04 | Discharge: 2020-05-04 | Disposition: A | Discharge status: Home / Self Care | Payer: HMO | Attending: Emergency Medicine | Admitting: Emergency Medicine

## 2020-05-04 ENCOUNTER — Emergency Department (HOSPITAL_COMMUNITY): Payer: HMO

## 2020-05-04 DIAGNOSIS — Z7901 Long term (current) use of anticoagulants: Secondary | ICD-10-CM | POA: Diagnosis not present

## 2020-05-04 DIAGNOSIS — I130 Hypertensive heart and chronic kidney disease with heart failure and stage 1 through stage 4 chronic kidney disease, or unspecified chronic kidney disease: Secondary | ICD-10-CM | POA: Insufficient documentation

## 2020-05-04 DIAGNOSIS — I5042 Chronic combined systolic (congestive) and diastolic (congestive) heart failure: Secondary | ICD-10-CM | POA: Insufficient documentation

## 2020-05-04 DIAGNOSIS — Z20822 Contact with and (suspected) exposure to covid-19: Secondary | ICD-10-CM | POA: Insufficient documentation

## 2020-05-04 DIAGNOSIS — F17210 Nicotine dependence, cigarettes, uncomplicated: Secondary | ICD-10-CM | POA: Diagnosis not present

## 2020-05-04 DIAGNOSIS — Z79899 Other long term (current) drug therapy: Secondary | ICD-10-CM | POA: Diagnosis not present

## 2020-05-04 DIAGNOSIS — J449 Chronic obstructive pulmonary disease, unspecified: Secondary | ICD-10-CM | POA: Diagnosis not present

## 2020-05-04 DIAGNOSIS — I2510 Atherosclerotic heart disease of native coronary artery without angina pectoris: Secondary | ICD-10-CM | POA: Insufficient documentation

## 2020-05-04 DIAGNOSIS — R0601 Orthopnea: Secondary | ICD-10-CM | POA: Insufficient documentation

## 2020-05-04 DIAGNOSIS — J45909 Unspecified asthma, uncomplicated: Secondary | ICD-10-CM | POA: Diagnosis not present

## 2020-05-04 DIAGNOSIS — Z951 Presence of aortocoronary bypass graft: Secondary | ICD-10-CM | POA: Insufficient documentation

## 2020-05-04 DIAGNOSIS — N1831 Chronic kidney disease, stage 3a: Secondary | ICD-10-CM | POA: Insufficient documentation

## 2020-05-04 DIAGNOSIS — I2583 Coronary atherosclerosis due to lipid rich plaque: Secondary | ICD-10-CM | POA: Diagnosis not present

## 2020-05-04 DIAGNOSIS — I517 Cardiomegaly: Secondary | ICD-10-CM | POA: Diagnosis not present

## 2020-05-04 DIAGNOSIS — R0602 Shortness of breath: Secondary | ICD-10-CM | POA: Diagnosis not present

## 2020-05-04 LAB — BRAIN NATRIURETIC PEPTIDE: B Natriuretic Peptide: 530.8 pg/mL — ABNORMAL HIGH (ref 0.0–100.0)

## 2020-05-04 LAB — BASIC METABOLIC PANEL
Anion gap: 11 (ref 5–15)
BUN: 18 mg/dL (ref 8–23)
CO2: 19 mmol/L — ABNORMAL LOW (ref 22–32)
Calcium: 9.3 mg/dL (ref 8.9–10.3)
Chloride: 111 mmol/L (ref 98–111)
Creatinine, Ser: 1.28 mg/dL — ABNORMAL HIGH (ref 0.44–1.00)
GFR calc Af Amer: 47 mL/min — ABNORMAL LOW (ref >60)
GFR calc non Af Amer: 41 mL/min — ABNORMAL LOW (ref >60)
Glucose, Bld: 137 mg/dL — ABNORMAL HIGH (ref 70–99)
Potassium: 3.5 mmol/L (ref 3.5–5.1)
Sodium: 141 mmol/L (ref 135–145)

## 2020-05-04 LAB — CBC
HCT: 41.4 % (ref 36.0–46.0)
Hemoglobin: 13 g/dL (ref 12.0–15.0)
MCH: 27.3 pg (ref 26.0–34.0)
MCHC: 31.4 g/dL (ref 30.0–36.0)
MCV: 87 fL (ref 80.0–100.0)
Platelets: 141 10*3/uL — ABNORMAL LOW (ref 150–400)
RBC: 4.76 MIL/uL (ref 3.87–5.11)
RDW: 21.8 % — ABNORMAL HIGH (ref 11.5–15.5)
WBC: 8.3 10*3/uL (ref 4.0–10.5)
nRBC: 0 % (ref 0.0–0.2)

## 2020-05-04 LAB — SARS CORONAVIRUS 2 BY RT PCR (HOSPITAL ORDER, PERFORMED IN ~~LOC~~ HOSPITAL LAB): SARS Coronavirus 2: NEGATIVE

## 2020-05-04 MED ORDER — AEROCHAMBER PLUS FLO-VU LARGE MISC
1.0000 | Freq: Once | Status: AC
  Administered 2020-05-04: 1

## 2020-05-04 MED ORDER — ALBUTEROL SULFATE HFA 108 (90 BASE) MCG/ACT IN AERS
8.0000 | INHALATION_SPRAY | Freq: Once | RESPIRATORY_TRACT | Status: AC
  Administered 2020-05-04: 8 via RESPIRATORY_TRACT
  Filled 2020-05-04: qty 6.7

## 2020-05-04 NOTE — Discharge Instructions (Addendum)
You were seen in the ER for shortness of breath when you sleep  Work-up today did not reveal any acute problems  We think your shortness of breath is probably from either continued cigarette use, bronchitis or possibly from developing sleep apnea  Please stop smoking cigarettes.  Use albuterol inhaler as needed which will help with bronchitis symptoms, shortness of breath or tightness.  Do not miss any of your blood thinning or fluid pills.  Please contact your primary care doctor for reevaluation in 72 hours if symptoms are not improving, you may need pulmonary testing or sleep apnea testing.  Return to the ER for worsening symptoms, chest pain, worsening shortness of breath with exertion, fevers, cough  Ok to take an extra lasix today.

## 2020-05-04 NOTE — ED Notes (Signed)
Patient states sob for about one and a half months that started after she received 3 blood transfusions for a GI bleed. Denies sob while laying in bed but states that it gets worse while ambulating. Physician aware.

## 2020-05-04 NOTE — ED Triage Notes (Signed)
Pt reports having sob, was seen here 2 months ago for same and had blood transfusion due to anemia. Reports ongoing sob since the transfusion, seems more severe at night. No acute resp distress is noted. Skin w/d.

## 2020-05-04 NOTE — ED Provider Notes (Signed)
Pt signed out by PA Gibbons.  Pt is able to ambulate without difficulty.  O2 sats stay at 99%.  Pt is stable for d/c.  Return if worse.   Isla Pence, MD 05/04/20 [phone]

## 2020-05-04 NOTE — ED Provider Notes (Signed)
[facility] EMERGENCY DEPARTMENT Provider Note   CSN: [id number] Arrival date & time: 05/04/20  1055     History Chief Complaint  Patient presents with  . Shortness of Breath  . Fatigue    [name] [name] is a 74 y.o. female with history of persistent/paroxysmal atrial fibrillation on Xarelto and toprolol despite CV 5883, systolic HF EF 25%, symptomatic anemia, AVM and duodenal ulcer, mild carotid artery stenosis, CAD s/p CABG, prediabetes, tobacco use presents to ER for evaluation of SOB. "for a while" but worsening last month. States she finally came in today because she can't get any sleep and is tired. Tells me she is the most short of breath when she tries to lay down on her back to go to sleep. She has had to increase use of pillows and sit up. Falls asleep but wakes right back up "in a panic attack", panting for air breathing hard in her belly, breaks out in a sweat and gets light headed as well.  States she is also short of breath with exertion but this is not new, maybe slightly worse. Has been taking all her medicines but admits to missing "one or two" of them a few times including Xarelto, fluid pill.  Denies chest pain, syncope. Denies leg swelling, calf pain. Denies cough. Denies hemoptysis.  Received one dose of vaccine but states she is scared to get her second one because after the first "something came over my brain I thought I was leaving this world".   HPI     Past Medical History:  Diagnosis Date  . Arthritis   . Asthma    ??  . Atopic dermatitis   . Cardiac arrest ([place]) 10/2014   [name]  . Cardiomyopathy, ischemic    a. EF previously low, improved to LVEF 50-55% as of May 2018  . Chronic combined systolic and diastolic heart failure ([place])   . CKD (chronic kidney disease), stage III   . COPD (chronic obstructive pulmonary disease) ([place])   . Esophageal dilatation 2013  . GERD (gastroesophageal reflux disease)   . Gout   . Headache   .  Hypertension   . Hypokalemia   . Idiopathic angioedema   . LGI bleed 08/06/2017   a. felt to be hemorrhoidal during that admission (no drop in Hgb).  . Lower back pain   . Persistent atrial fibrillation (HCC)    a. h/o difficult to control rates (complicated by noncompliance), not felt to be a candidate for ablation or antiarrhythmic due to noncompliance.  . Personal history of noncompliance with medical treatment, presenting hazards to health   . Prediabetes   . S/P CABG x 2 with clipping of LA appendage 11/14/2014   LIMA to LAD, SVG to OM, EVH via right thigh  . Urine incontinence   . Uterine fibroid     Patient Active Problem List   Diagnosis Date Noted  . Acute lower GI bleeding 02/10/2020  . Tobacco dependence 02/10/2020  . Obesity, Class II, BMI 35-39.9 02/10/2020  . Acute upper GI bleeding 10/26/2019  . COPD (chronic obstructive pulmonary disease) ([place]) 10/26/2019  . Acute GI bleeding 10/09/2019  . Acute blood loss anemia 10/09/2019  . Postmenopausal vaginal bleeding 01/04/2018  . Snoring 01/04/2018  . Acute on chronic combined systolic and diastolic CHF (congestive heart failure) ([place])   . CHF exacerbation ([place]) 10/16/2017  . Noncompliance 10/16/2017  . Acute on chronic congestive heart failure ([place])   . Thrombocytopenia ([place]) 09/14/2017  .  Acute drug-induced gout of right foot   . Medication noncompliance due to cognitive impairment 09/06/2017  . Acute diastolic CHF (congestive heart failure) ([place]) 08/06/2017  . LGI bleed, likely hemorrhoids 08/06/2017  . Paroxysmal atrial fibrillation ([place]) 02/08/2017  . Cardiomyopathy, ischemic 02/08/2017  . Dysphagia 02/08/2017  . CKD (chronic kidney disease), stage III 01/30/2017  . Pain in shoulder 02/08/2016  . Breast pain, left 02/08/2016  . Bilateral arm numbness and tingling while sleeping 02/08/2016  . Painful lumpy left breast 09/23/2015  . [place] intertrigo 02/11/2015  . S/P CABG x 2 11/14/2014  . Accelerated  hypertension   . Cardiac arrest ([place]) 11/04/2014  . Left main coronary artery disease 11/04/2014  . Coronary artery disease due to lipid rich plaque   . Acute respiratory failure with hypoxemia ([place])   . Essential hypertension   . Atrial fibrillation with RVR ([place])   . Hypokalemia 10/30/2014  . Chronic combined systolic and diastolic CHF (congestive heart failure) ([place]) 10/30/2014  . NSTEMI (non-ST elevated myocardial infarction) ([place])   . DOE (dyspnea on exertion)   . CAP (community acquired pneumonia) 10/29/2014    Past Surgical History:  Procedure Laterality Date  . BALLOON DILATION N/A 10/10/2019   Procedure: BALLOON DILATION;  Surgeon: Otis Brace, MD;  [facility]: [facility];  Service: Gastroenterology;  Laterality: N/A;  . BIOPSY  10/10/2019   Procedure: BIOPSY;  Surgeon: Otis Brace, MD;  Location: [facility];  Service: Gastroenterology;;  . BIOPSY  10/11/2019   Procedure: BIOPSY;  Surgeon: Otis Brace, MD;  Location: [facility];  Service: Gastroenterology;;  . CARDIOVERSION N/A 11/18/2014   Procedure: CARDIOVERSION;  Surgeon: [name], MD;  Location: [facility];  Service: Cardiovascular;  Laterality: N/A;  . CARDIOVERSION N/A 12/25/2017   Procedure: CARDIOVERSION;  Surgeon: [name], MD;  Location: [place];  Service: Cardiovascular;  Laterality: N/A;  . CLIPPING OF ATRIAL APPENDAGE N/A 11/14/2014   Procedure: CLIPPING OF ATRIAL APPENDAGE;  Surgeon: [name], MD;  Location: [facility];  Service: Open Heart Surgery;  Laterality: N/A;  . COLONOSCOPY  2013  . COLONOSCOPY WITH PROPOFOL N/A 10/11/2019   Procedure: COLONOSCOPY WITH PROPOFOL;  Surgeon: Otis Brace, MD;  Location: [facility];  Service: Gastroenterology;  Laterality: N/A;  . CORONARY ARTERY BYPASS GRAFT N/A 11/14/2014   Procedure: CORONARY ARTERY BYPASS GRAFTING (CABG)TIMES 2 USING LEFT INTERNAL MAMMARY ARTERY AND RIGHT SAPHENOUS VEIN HARVESTED ENDOSCOPICALLY;  Surgeon: [name], MD;  Location: [facility];  Service: Open Heart Surgery;  Laterality: N/A;  . ESOPHAGOGASTRODUODENOSCOPY (EGD) WITH PROPOFOL N/A 10/10/2019   Procedure: ESOPHAGOGASTRODUODENOSCOPY (EGD) WITH PROPOFOL;  Surgeon: Otis Brace, MD;  [facility];  Service: Gastroenterology;  Laterality: N/A;  . ESOPHAGOGASTRODUODENOSCOPY (EGD) WITH PROPOFOL N/A 10/27/2019   Procedure: ESOPHAGOGASTRODUODENOSCOPY (EGD) WITH PROPOFOL;  Surgeon: Ronnette Juniper, MD;  Location: [place];  Service: Gastroenterology;  Laterality: N/A;  . GIVENS CAPSULE STUDY N/A 10/27/2019   Procedure: GIVENS CAPSULE STUDY;  Surgeon: Ronnette Juniper, MD;  Location: [facility] [place];  Service: Gastroenterology;  Laterality: N/A;  . LEFT HEART CATHETERIZATION WITH CORONARY ANGIOGRAM N/A 11/04/2014   Procedure: LEFT HEART CATHETERIZATION WITH CORONARY ANGIOGRAM;  Surgeon: Troy Sine, MD;  Location[facility] CATH LAB;  Service: Cardiovascular;  Laterality: N/A;  . POLYPECTOMY  10/11/2019   Procedure: POLYPECTOMY;  Surgeon: Otis Brace, MD;  Location: [facility] ENDOSCOPY;  Service: Gastroenterology;;  . TEE WITHOUT CARDIOVERSION N/A 11/14/2014   Procedure: TRANSESOPHAGEAL ECHOCARDIOGRAM (TEE);  Surgeon: [name], MD;  Location: [facility]  OR;  Service: Open Heart Surgery;  Laterality: N/A;  . TEE WITHOUT CARDIOVERSION N/A 12/25/2017   Procedure: TRANSESOPHAGEAL ECHOCARDIOGRAM (TEE);  Surgeon: [name], MD;  Location[facility] ENDOSCOPY;  Service: Cardiovascular;  Laterality: N/A;  . TEMPORARY PACEMAKER INSERTION  11/04/2014   Procedure: TEMPORARY PACEMAKER INSERTION;  Surgeon: Troy Sine, MD;  Location: [facility] CATH LAB;  Service: Cardiovascular;;  . TOOTH EXTRACTION       OB History   No obstetric history on file.     Family History  Problem Relation Age of Onset  . Cancer Mother        LYMPHOMA  . Heart disease Father   . Other Father        TB  . CVA Sister   . Prostate cancer Brother 26  . Diabetes Brother     Social History    Tobacco Use  . Smoking status: Current Some Day Smoker    Packs/day: 0.10    Years: 56.00    Pack years: 5.60    Types: Cigarettes  . Smokeless tobacco: Never Used  . Tobacco comment: "I might still take a puff now and then"  Vaping Use  . Vaping Use: Never used  Substance Use Topics  . Alcohol use: No    Alcohol/week: 0.0 standard drinks  . Drug use: No    Home Medications Prior to Admission medications   Medication Sig Start Date End Date Taking? Authorizing Provider  acetaminophen (TYLENOL) 500 MG tablet Take 500 mg by mouth every 6 (six) hours as needed for moderate pain.     [provider]  allopurinol (ZYLOPRIM) 300 MG tablet Take 1 tablet (300 mg total) by mouth daily. 01/04/18   Georgiana Shore, NP  atorvastatin (LIPITOR) 10 MG tablet Take 10 mg by mouth daily.    [provider]  furosemide (LASIX) 20 MG tablet Take 1 tablet (20 mg total) by mouth daily. 02/20/20   Jerline Pain, MD  losartan (COZAAR) 25 MG tablet Take 1 tablet (25 mg total) by mouth 2 (two) times daily. 07/17/19   Bensimhon, Shaune Pascal, MD  metoprolol succinate (TOPROL-XL) 50 MG 24 hr tablet TAKE ONE TABLET BY MOUTH ONCE DAILY WITH OR immediately following A meal 12/12/19   Bensimhon, Shaune Pascal, MD  pantoprazole (PROTONIX) 40 MG tablet Take 1 tablet (40 mg total) by mouth daily. 02/11/20 04/11/20  Kayleen Memos, DO  rivaroxaban (XARELTO) 20 MG TABS tablet Take 1 tablet (20 mg total) by mouth daily with supper. Resume from 11/01/2019 11/01/19   Aline August, MD  senna-docusate (SENOKOT-S) 8.6-50 MG tablet Take 1 tablet by mouth 2 (two) times daily. 10/29/19   Aline August, MD  spironolactone (ALDACTONE) 25 MG tablet Take 1 tablet (25 mg total) by mouth daily. Patient taking differently: Take 12.5 mg by mouth daily.  12/02/19 03/09/20  Kathyrn Drown D, NP  triamcinolone ointment (KENALOG) 0.5 % Apply 1 application topically 2 (two) times daily as needed (itching, rash).  11/15/19   [provider]    Allergies    Bee venom, Ivp dye [iodinated diagnostic agents], Codeine, Entresto [sacubitril-valsartan], and Shrimp [shellfish allergy]  Review of Systems   Review of Systems  Respiratory: Positive for shortness of breath.        Orthopnea   Cardiovascular: Positive for palpitations.  Hematological: Bruises/bleeds easily.  All other systems reviewed and are negative.   Physical Exam Updated Vital Signs BP (!) 156/81 (BP Location: Right Arm)  Pulse 74   Temp 98.9 F (37.2 C) (Oral)   Resp 20   SpO2 99%   Physical Exam Vitals and nursing note reviewed.  Constitutional:      General: She is not in acute distress.    Appearance: She is well-developed.     Comments: Obese  HENT:     Head: Normocephalic and atraumatic.     Right Ear: External ear normal.     Left Ear: External ear normal.     Nose: Nose normal.  Eyes:     General: No scleral icterus.    Conjunctiva/sclera: Conjunctivae normal.  Cardiovascular:     Rate and Rhythm: Normal rate and regular rhythm.     Heart sounds: Normal heart sounds. No murmur heard.      Comments: Atrial fibrillation on monitor, heart rate in the 70s-80s.  No murmurs.  No lower extremity edema.  No calf tenderness. Pulmonary:     Effort: Pulmonary effort is normal.     Breath sounds: Examination of the right-lower field reveals decreased breath sounds. Examination of the left-lower field reveals decreased breath sounds. Decreased breath sounds present.     Comments: No increased work of breathing.  No wheezing or crackles.  Diminished air sounds in lower lobes bilaterally, difficult exam due to body habitus. Musculoskeletal:        General: No deformity. Normal range of motion.     Cervical back: Normal range of motion and neck supple.  Skin:    General: Skin is warm and dry.     Capillary Refill: Capillary refill takes less than 2 seconds.  Neurological:     Mental Status: She is alert and oriented to person,  place, and time.  Psychiatric:        Behavior: Behavior normal.        Thought Content: Thought content normal.        Judgment: Judgment normal.     ED Results / Procedures / Treatments   Labs (all labs ordered are listed, but only abnormal results are displayed) Labs Reviewed  BASIC METABOLIC PANEL - Abnormal; Notable for the following components:      Result Value   CO2 19 (*)    Glucose, Bld 137 (*)    Creatinine, Ser 1.28 (*)    GFR calc non Af Amer 41 (*)    GFR calc Af Amer 47 (*)    All other components within normal limits  CBC - Abnormal; Notable for the following components:   RDW 21.8 (*)    Platelets 141 (*)    All other components within normal limits  SARS CORONAVIRUS 2 BY RT PCR (HOSPITAL ORDER, [place] LAB)  BRAIN NATRIURETIC PEPTIDE    EKG EKG Interpretation  Date/Time:  Monday May 04 2020 11:09:23 EDT Ventricular Rate:  81 PR Interval:  150 QRS Duration: 96 QT Interval:  394 QTC Calculation: 457 R Axis:   41 Text Interpretation: Sinus rhythm with frequent Premature ventricular complexes Cannot rule out Anterior infarct , age undetermined ST & T wave abnormality, consider inferior ischemia Abnormal ECG Confirmed by Elnora Morrison [phone]) on 05/04/2020 3:07:25 PM   Radiology DG Chest 2 View  Result Date: 05/04/2020 CLINICAL DATA:  Shortness of breath EXAM: CHEST - 2 VIEW COMPARISON:  October 09, 2019. FINDINGS: There is atelectatic change in the left mid lung. There is diffuse interstitial thickening. No edema or airspace opacity. Heart is enlarged with pulmonary vascularity normal. Patient is status  post coronary artery bypass grafting. There is a left atrial appendage clamp. There is aortic atherosclerosis. No adenopathy. There is degenerative change in the thoracic spine. IMPRESSION: Diffuse interstitial thickening likely is indicative of chronic bronchitis. There is atelectatic change left mid lung. No edema or airspace  opacity. There is cardiomegaly with postoperative changes. Aortic Atherosclerosis (ICD10-I70.0). Electronically Signed   By: Lowella Grip III M.D.   On: 05/04/2020 14:56    Procedures Procedures (including critical care time)  Medications Ordered in ED Medications  albuterol (VENTOLIN HFA) 108 (90 Base) MCG/ACT inhaler 8 puff (has no administration in time range)  AeroChamber Plus Flo-Vu Large MISC 1 each (has no administration in time range)    ED Course  I have reviewed the triage vital signs and the nursing notes.  Pertinent labs & imaging results that were available during my care of the patient were reviewed by me and considered in my medical decision making (see chart for details).  Clinical Course as of May 04 1512  Mon May 04, 2020  1403 Hemoglobin: 13.0 [CG]  1403 Creatinine(!): 1.28 [CG]  1508 IMPRESSION: Diffuse interstitial thickening likely is indicative of chronic bronchitis. There is atelectatic change left mid lung. No edema or airspace opacity.  There is cardiomegaly with postoperative changes.  Aortic Atherosclerosis (ICD10-I70.0).  DG Chest 2 View [CG]    Clinical Course User Index [CG] Arlean Hopping   MDM Rules/Calculators/A&P                          74 year old female with history of persistent/paroxysmal atrial fibrillation rate controlled on Xarelto, systolic heart failure, cigarette use presents for shortness of breath when trying to sleep.  Sounds like orthopnes.  Describes PND as well.  "wake up in a panic" and panting. Has chronic exertional shortness of breath but this is not significantly worse. Continues to smoke cigarettes. No CP, cough, fevers.   Patient is EMR echo 11/2019 with improved EF 55-60%, left ventricle regional wall motion abnormalities, LVH, mild aortic valve regurgitation and mild mitral valve regurgitation.  She had a stress test January 2020 that was low risk.  Carotid duplex bilateral with nonobstructive disease  on 11/2019.  She has history of GI bleed.  Is on Xarelto.  No red flags like exertional chest pain.  No fever.  No cough.  No calf pain or body swelling.  Reports some missed doses of some of her medicines including Xarelto and diuretic.  No hypervolemia on exam. In atrial fibrillation, rate controlled. Diminished sounds in lower lobes. No wheezing, crackles.  ER work-up initiated in triage including CBC, BMP and EKG.    I have personally visualized and interpreted ER work-up.  ER work up reveals normal hemoglobin.  Mildly elevated creatinine not significantly worse than baseline.  EKG with sinus rhythm with PVC, non specific T wave abnormalities V4-6.     I have ordered BNP, COVID, CXR. Will plan to ambulate with pulse ox.   Symptoms overall sounds like orthopnea ?OSA vs bronchitis/restrictive airway disease or COPD. Ddx also includes decompensated CHF.  PE less likely as she has no CP, tachycardia, tachypnea or hypoxia however at risk given atrial fibrillation and partial compliance with Xarelto.   1500: CXR with chronic bronchitis changes, will give albuterol 8 puffs.   Plan to ambulate patient with pulse ox. Pending COVID, BNP.   Will hand patient off to oncoming EDP [place] will with follow up on remainder work  up, determine disposition.   Anticipate discharge if all benign with follow up with PCP for possible OSA pulmonary testing.   Shared with EDP Z[place]itz.   Final Clinical Impression(s) / ED Diagnoses Final diagnoses:  Orthopnea    Rx / DC Orders ED Discharge Orders    None       Kinnie Feil, PA-C 05/04/20 1513    Elnora Morrison, MD 05/05/20 [phone]

## 2020-05-04 NOTE — ED Notes (Signed)
Patient verbalizes understanding of discharge instructions. Opportunity for questioning and answers were provided. Armband removed by staff, pt discharged from ED in wheelchair to home.   

## 2020-05-04 NOTE — ED Notes (Signed)
Pt ambulated well within the room on pulse oximetry. Pt O2 saturatioin maintained at 99% or greater. Pt did have an increase in SOB. Pt returned to bed.

## 2020-05-13 DIAGNOSIS — I2510 Atherosclerotic heart disease of native coronary artery without angina pectoris: Secondary | ICD-10-CM | POA: Diagnosis not present

## 2020-05-13 DIAGNOSIS — J449 Chronic obstructive pulmonary disease, unspecified: Secondary | ICD-10-CM | POA: Diagnosis not present

## 2020-05-13 DIAGNOSIS — I214 Non-ST elevation (NSTEMI) myocardial infarction: Secondary | ICD-10-CM | POA: Diagnosis not present

## 2020-05-13 DIAGNOSIS — I5020 Unspecified systolic (congestive) heart failure: Secondary | ICD-10-CM | POA: Diagnosis not present

## 2020-05-13 DIAGNOSIS — I4891 Unspecified atrial fibrillation: Secondary | ICD-10-CM | POA: Diagnosis not present

## 2020-05-13 DIAGNOSIS — M8580 Other specified disorders of bone density and structure, unspecified site: Secondary | ICD-10-CM | POA: Diagnosis not present

## 2020-05-13 DIAGNOSIS — I25810 Atherosclerosis of coronary artery bypass graft(s) without angina pectoris: Secondary | ICD-10-CM | POA: Diagnosis not present

## 2020-05-13 DIAGNOSIS — I509 Heart failure, unspecified: Secondary | ICD-10-CM | POA: Diagnosis not present

## 2020-05-13 DIAGNOSIS — I10 Essential (primary) hypertension: Secondary | ICD-10-CM | POA: Diagnosis not present

## 2020-05-13 DIAGNOSIS — E1122 Type 2 diabetes mellitus with diabetic chronic kidney disease: Secondary | ICD-10-CM | POA: Diagnosis not present

## 2020-05-13 DIAGNOSIS — E7800 Pure hypercholesterolemia, unspecified: Secondary | ICD-10-CM | POA: Diagnosis not present

## 2020-05-13 DIAGNOSIS — N183 Chronic kidney disease, stage 3 unspecified: Secondary | ICD-10-CM | POA: Diagnosis not present

## 2020-05-18 ENCOUNTER — Other Ambulatory Visit: Payer: Self-pay

## 2020-05-18 NOTE — Patient Outreach (Signed)
[facility] [name]) Care Management Chronic Special Needs Program  05/18/2020  Name: [name] [name] DOB: 11/11/1945  MRN: [id number]  [name] [name] is enrolled in a chronic special needs plan for Heart Failure. RNCM called to follow up and update care plan.   Subjective: client reports, "seems like I'm doing ok". Client reports primary issue is xarelto. She reports she is in the donut hole. She states she only has two pills left and she states that she is awaiting a return call from "[employer]" regarding medication assistance. Client reports she has been using [facility]. Client reports she has been following up with primary care provider as recommended.   Client states her doctor reports she has "borderline" diabetes. Per records, A1C 6.4 on 03/18/2020. She states she is taking Jardiance, but is unsure of dosage at this time. Client receptive to information regarding diet/meal planning.    Client denies any signs/symptoms of heart failure exacerbation.   Client reports [facility] called once, but she has not heard from them since. Per record, not engaged with [facility] at this time.   Goals Addressed              This Visit's Progress   .  COMPLETED: Client understands the importance of follow-up with providers by attending scheduled visits        Client reports she attends provider visits as scheduled and voices understanding of importance.    .  COMPLETED: Client verbalize knowledge of Heart Failure disease self management skills within the next 6-9 months.        Client reports she is taking medications as prescribed and follows up with providers as scheduled. Reviewed signs/symptoms of exacerbation. Continue to follow a low salt diet.    .  COMPLETED: Client verbalizes knowledge of Heart Attack self management skills within the next 6-9 months.        Client denies any issues or concern.    .  COMPLETED: Client will report weighing and recording weights at least  three times/week within the next 6-9 months.        Client reports she is weighing at least three days per week.  Reviewed parameters for when to call the doctor.     .  COMPLETED: Client will verbalize knowledge of self management of Hypertension as evidences by BP reading of 140/90 or less; or as defined by provider        Blood 134/80 on 04/27/2020 and 118/70 on 03/09/2020.  Continue blood pressure self management actions: Follow up with your doctor as scheduled. Monitor your blood pressure periodically and take to your doctor's appointment.  Continue to take your medications as recommended by your provider and call if you have any questions.    .  COMPLETED: Decrease inpatient Heart Failure admissions/ readmissions with in the year        No admission related to heart failure.    .  COMPLETED: Decrease the use of hospital emergency department related to heart failure within the next year        No emergency room visits due to heart failure.    .  Maintain timely refills of Heart Failure medication as prescribed within the year    On track     Please call RN care coordinator if you continue to have trouble obtaining any of your medications. Discussed difficulty obtaining Xarelto. I sent a referral to [facility] for assistance. It is important to take your medications as prescribed. Please call  your doctor if you have any questions. Please call if you have further difficulty obtaining your medications.    .  COMPLETED: Obtain annual  Lipid Profile, LDL-C        Done 03/18/2020    .  Patient Stated: client will verbalize receipt of prediabetes and diabetes diet education. (pt-stated)        Provided Emmi education: "Prediabetes"; "Guide to eating when you have diabetes"; "Diabetes and diet". Please review and call if you have any question. Plan to discuss at next telephonic assessment.    .  COMPLETED: Visit Primary Care Provider or Cardiologist at least 2 times per year         Cardiologist seen 12/02/19 and 7/26/2. Advanced Heart Failure Clinic visit completed on 12/02/19 and 08/27/2019.       Plan: referral sent to pharmacist; communicated with [facility] regard member engagement.send educational material, send updated care plan to client; send updated care plan to primary care provider. Next outreach per tier level within the next 6 months.   [name] Silversmith, RN, MSN, [place] [phone]  05/19/2020 1630: update- client reports she received additional 15 tablets of Xarelo and reports she will follow up with pharmacy prior to running out. She reports [name] [name], Care Coordinator, in office is assisting her with obtaining medications.

## 2020-05-18 NOTE — Patient Outreach (Signed)
Received a [place] referral.  I sent the referral to Cristy Patille an [facility] Pharmacist for Dr. Lysle Rubens.

## 2020-05-18 NOTE — Patient Outreach (Signed)
  [facility]) Care Management Chronic Special Needs Program    05/18/2020  Name: Cache, Decoursey: [birth year]  MRN: [id number]   Ms. Shamona Wirtz is enrolled in a chronic special needs plan for Heart Failure. RNCM called cardiologist office to see if they have any samples of Xarelto. Office closed at the time.   Plan: RNCM will continue to follow.   Thea Silversmith, RN, MSN, [place] [phone]

## 2020-05-19 ENCOUNTER — Other Ambulatory Visit: Payer: Self-pay

## 2020-05-19 NOTE — Patient Outreach (Signed)
  [facility] [facility]) Care Management Chronic Special Needs Program    05/19/2020  Name: [name], [name]: April 25, 1946  MRN: [id number]   [name] is enrolled in a chronic special needs plan for Heart Failure.   Care coordination: RNCM has made several attempts to reach cardiologist office regarding samples. [facility] made [facility] on yesterday. RNCM called client to follow up to see if she has been in contact with someone regarding medication assistance. Client reports she received samples from [facility], chronic care coordinator with provider. She states she is to follow up with the pharmacist afterward.  RNCM encouraged client to call RNCM if she has any additional problems and as needed. Client without questions or concerns at this time.   Plan: Client will be outreach per tier level within 6 months.  Thea Silversmith, RN, MSN, [place] [phone]

## 2020-05-19 NOTE — Patient Outreach (Signed)
  [facility]) Care Management Chronic Special Needs Program    05/19/2020  Name: [name], [name]: August 22, 1945  MRN: [id number]   [name] is enrolled in a chronic special needs plan for Heart Failure.   Care Coordination: RNCM place call to upstream pharmacist Alyse Low) at provider office regarding client medication needs. Voice message left.   Plan: Await return call and continue to follow.  Thea Silversmith, RN, MSN, [place] [facility] [phone]

## 2020-06-02 DIAGNOSIS — E1122 Type 2 diabetes mellitus with diabetic chronic kidney disease: Secondary | ICD-10-CM | POA: Diagnosis not present

## 2020-06-02 DIAGNOSIS — I5020 Unspecified systolic (congestive) heart failure: Secondary | ICD-10-CM | POA: Diagnosis not present

## 2020-06-02 DIAGNOSIS — I10 Essential (primary) hypertension: Secondary | ICD-10-CM | POA: Diagnosis not present

## 2020-06-02 DIAGNOSIS — M8580 Other specified disorders of bone density and structure, unspecified site: Secondary | ICD-10-CM | POA: Diagnosis not present

## 2020-06-02 DIAGNOSIS — I214 Non-ST elevation (NSTEMI) myocardial infarction: Secondary | ICD-10-CM | POA: Diagnosis not present

## 2020-06-02 DIAGNOSIS — E7800 Pure hypercholesterolemia, unspecified: Secondary | ICD-10-CM | POA: Diagnosis not present

## 2020-06-02 DIAGNOSIS — I2510 Atherosclerotic heart disease of native coronary artery without angina pectoris: Secondary | ICD-10-CM | POA: Diagnosis not present

## 2020-06-02 DIAGNOSIS — H269 Unspecified cataract: Secondary | ICD-10-CM | POA: Diagnosis not present

## 2020-06-02 DIAGNOSIS — I4891 Unspecified atrial fibrillation: Secondary | ICD-10-CM | POA: Diagnosis not present

## 2020-06-02 DIAGNOSIS — J449 Chronic obstructive pulmonary disease, unspecified: Secondary | ICD-10-CM | POA: Diagnosis not present

## 2020-06-02 DIAGNOSIS — N183 Chronic kidney disease, stage 3 unspecified: Secondary | ICD-10-CM | POA: Diagnosis not present

## 2020-06-02 DIAGNOSIS — M1990 Unspecified osteoarthritis, unspecified site: Secondary | ICD-10-CM | POA: Diagnosis not present

## 2020-06-15 NOTE — Progress Notes (Signed)
Cardiology Office Note   Date:  06/16/2020   ID:  [name], [name] 04/13/1946, MRN [id number]  PCP:  [name], MD  Cardiologist:  [name], MD/ [name], MD  Chief Complaint  Patient presents with  . Follow-up   History of Present Illness: [name] [name] is a 74 y.o. female who presents for follow-up, seen for [name].  Ms. Robillard has a hx of CAD s/p CABG x2 in 2016, chronic combined CHF/ischemic cardiomyopathy with EF of 30-35% on Echo in 2019>> with normalization per last echocardiogram, paroxysmal atrial fibrillation on Xarelto, COPD, hypertension, hyperlipidemia, pre-diabetes, CKD stage III, GERD, medication non-compliance, and prior tobacco use (quit in 2016).   She is typically followed by [name] and [name] for her cardiology care. She was initially seen in 11/2014 for atrial fibrillation and found to have a reduced LVEF at that time leading to an ischemic work-up which showed severe multivessel CAD with left main involvement. She ended up undergoing CABG x2 with LIMA to LAD and SVG to OM at that time. She has a history of medication non-compliance and has significant memory issues; therefore, she has not been felt to be a good ablation or anti-arrhythmic candidate in the past. Echo from 07/2018 showed LVEF of 30-35% with normal wall motion, grade 1 diastolic dysfunction, and mild MR. RV was also noted to be mildly dilated with moderately to severely reduced systolic function. She has previously been intolerant to [facility] due to chest pain. Most recent ischemic work-up was a Lexiscan Myoview in 08/2018 which was low risk and showed no reversible ischemia.   She presented to [facility] on 10/08/19 via EMS for AF. Patient reported that she had been seen by her PCP secondary to black stools and was advised to stop her Xarelto. She was treated with IV Cardizem 20mg  vis EMS and heart rate improved to the 110's but she was still in atrial fibrillation. On 10/09/19 she  converted back to NSR. GI team reported she was stable to restart Xarelto 10/12/19 from their standpoint.   She was seen by [name] prior to last OV with reports she has been doing well since hospital discharge. Repeat echocardiogram 12/02/2019 with EF normalization at 60%.  No medication changes were made at that time. She was then seen by [name] 03/09/2020 at which time she felt that she was in atrial fibrillation at times with feelings of fatigue.  Plan was to continue Toprol given prior DCCV in 2019 with conversion back to A. Fib.  She was then seen in the ED 05/04/2020 for bilateral edema.  ED work-up felt to be unremarkable however BNP elevated at 530.  CXR was stable with no edema.  EKG stable. Plan was to take an extra Lasix dose for LE edema and follow-up with cardiology  Today, she is her for ED follow up and actually cannot remember being seen in the ED.  She states she is no longer short of breath and has had no further LE edema.  She cannot return call if she has needed additional Lasix.  She has recently suffered many losses in her family and states that she was in depression for quite some time.  Reports that she is somewhat better however still struggling some.  She does state that she will at times have wheezing for which she was prescribed an inhaler that has helped her symptoms.  Continues to smoke.  Likely shortness of breath above secondary to mild  HF and ongoing tobacco use.  She denies chest pain, LE edema, orthopnea, PND, dizziness or syncope.  Is tolerating her medications well.  Does state that she has had some dark spotting in her underwear which we discussed was concerning given her prior history of GI bleeding.  I have recommended that she follow with the GI.  She denies bright red bleeding in her stool or urine.  If ongoing issues, may need to reconsider Xarelto therapy.   Past Medical History:  Diagnosis Date  . Arthritis   . Asthma    ??  . Atopic dermatitis   .  Cardiac arrest ([place]) 10/2014   [name]  . Cardiomyopathy, ischemic    a. EF previously low, improved to LVEF 50-55% as of May 2018  . Chronic combined systolic and diastolic heart failure ([place])   . CKD (chronic kidney disease), stage III ([place])   . COPD (chronic obstructive pulmonary disease) ([place])   . Esophageal dilatation 2013  . GERD (gastroesophageal reflux disease)   . Gout   . Headache   . Hypertension   . Hypokalemia   . Idiopathic angioedema   . LGI bleed 08/06/2017   a. felt to be hemorrhoidal during that admission (no drop in Hgb).  . Lower back pain   . Persistent atrial fibrillation (HCC)    a. h/o difficult to control rates (complicated by noncompliance), not felt to be a candidate for ablation or antiarrhythmic due to noncompliance.  . Personal history of noncompliance with medical treatment, presenting hazards to health   . Prediabetes   . S/P CABG x 2 with clipping of LA appendage 11/14/2014   LIMA to LAD, SVG to OM, EVH via right thigh  . Urine incontinence   . Uterine fibroid     Past Surgical History:  Procedure Laterality Date  . BALLOON DILATION N/A 10/10/2019   Procedure: BALLOON DILATION;  Surgeon: Otis Brace, MD;  Location: [facility];  Service: Gastroenterology;  Laterality: N/A;  . BIOPSY  10/10/2019   Procedure: BIOPSY;  Surgeon: Otis Brace, MD;  Location: [place];  Service: Gastroenterology;;  . BIOPSY  10/11/2019   Procedure: BIOPSY;  Surgeon: Otis Brace, MD;  Location: [place];  Service: Gastroenterology;;  . CARDIOVERSION N/A 11/18/2014   Procedure: CARDIOVERSION;  Surgeon: [name], MD;  Location: [place];  Service: Cardiovascular;  Laterality: N/A;  . CARDIOVERSION N/A 12/25/2017   Procedure: CARDIOVERSION;  Surgeon: [name], MD;  Location: [place];  Service: Cardiovascular;  Laterality: N/A;  . CLIPPING OF ATRIAL APPENDAGE N/A 11/14/2014   Procedure: CLIPPING OF ATRIAL APPENDAGE;  Surgeon: [name], MD;  Location: [place];  Service: Open Heart Surgery;  Laterality: N/A;  . COLONOSCOPY  2013  . COLONOSCOPY WITH PROPOFOL N/A 10/11/2019   Procedure: COLONOSCOPY WITH PROPOFOL;  Surgeon: Otis Brace, MD;  Location: [place];  Service: Gastroenterology;  Laterality: N/A;  . CORONARY ARTERY BYPASS GRAFT N/A 11/14/2014   Procedure: CORONARY ARTERY BYPASS GRAFTING (CABG)TIMES 2 USING LEFT INTERNAL MAMMARY ARTERY AND RIGHT SAPHENOUS VEIN HARVESTED ENDOSCOPICALLY;  Surgeon: [name], MD;  Location: [place];  Service: Open Heart Surgery;  Laterality: N/A;  . ESOPHAGOGASTRODUODENOSCOPY (EGD) WITH PROPOFOL N/A 10/10/2019   Procedure: ESOPHAGOGASTRODUODENOSCOPY (EGD) WITH PROPOFOL;  Surgeon: Otis Brace, MD;  Location: [facility];  Service: Gastroenterology;  Laterality: N/A;  . ESOPHAGOGASTRODUODENOSCOPY (EGD) WITH PROPOFOL N/A 10/27/2019   Procedure: ESOPHAGOGASTRODUODENOSCOPY (EGD) WITH PROPOFOL;  Surgeon: Ronnette Juniper, MD;  Location: [place];  Service: Gastroenterology;  Laterality: N/A;  . GIVENS CAPSULE STUDY N/A 10/27/2019   Procedure: GIVENS CAPSULE STUDY;  Surgeon: Ronnette Juniper, MD;  Location: [place];  Service: Gastroenterology;  Laterality: N/A;  . LEFT HEART CATHETERIZATION WITH CORONARY ANGIOGRAM N/A 11/04/2014   Procedure: LEFT HEART CATHETERIZATION WITH CORONARY ANGIOGRAM;  Surgeon: Troy Sine, MD;  Location: [facility] CATH LAB;  Service: Cardiovascular;  Laterality: N/A;  . POLYPECTOMY  10/11/2019   Procedure: POLYPECTOMY;  Surgeon: Otis Brace, MD;  Location: [place] ENDOSCOPY;  Service: Gastroenterology;;  . TEE WITHOUT CARDIOVERSION N/A 11/14/2014   Procedure: TRANSESOPHAGEAL ECHOCARDIOGRAM (TEE);  Surgeon: [name], MD;  Location: [place];  Service: Open Heart Surgery;  Laterality: N/A;  . TEE WITHOUT CARDIOVERSION N/A 12/25/2017   Procedure: TRANSESOPHAGEAL ECHOCARDIOGRAM (TEE);  Surgeon: [name], MD;  Location: [facility] ENDOSCOPY;  Service:  Cardiovascular;  Laterality: N/A;  . TEMPORARY PACEMAKER INSERTION  11/04/2014   Procedure: TEMPORARY PACEMAKER INSERTION;  Surgeon: Troy Sine, MD;  Location: [facility] CATH LAB;  Service: Cardiovascular;;  . TOOTH EXTRACTION       Current Outpatient Medications  Medication Sig Dispense Refill  . acetaminophen (TYLENOL) 500 MG tablet Take 500 mg by mouth every 6 (six) hours as needed for moderate pain.     Marland Kitchen allopurinol (ZYLOPRIM) 300 MG tablet Take 1 tablet (300 mg total) by mouth daily. 30 tablet 11  . atorvastatin (LIPITOR) 10 MG tablet Take 10 mg by mouth daily.    . Empagliflozin (JARDIANCE PO) Take 1 tablet by mouth.    . furosemide (LASIX) 20 MG tablet Take 1 tablet (20 mg total) by mouth daily. 90 tablet 2  . losartan (COZAAR) 25 MG tablet Take 1 tablet (25 mg total) by mouth 2 (two) times daily. 180 tablet 1  . metoprolol succinate (TOPROL-XL) 50 MG 24 hr tablet TAKE ONE TABLET BY MOUTH ONCE DAILY WITH OR immediately following A meal 90 tablet 3  . rivaroxaban (XARELTO) 20 MG TABS tablet Take 1 tablet (20 mg total) by mouth daily with supper. Resume from 11/01/2019    . senna-docusate (SENOKOT-S) 8.6-50 MG tablet Take 1 tablet by mouth 2 (two) times daily. 20 tablet 0  . triamcinolone ointment (KENALOG) 0.5 % Apply 1 application topically 2 (two) times daily as needed (itching, rash).     . pantoprazole (PROTONIX) 40 MG tablet Take 1 tablet (40 mg total) by mouth daily. 60 tablet 0  . spironolactone (ALDACTONE) 25 MG tablet Take 1 tablet (25 mg total) by mouth daily. 90 tablet 3   No current facility-administered medications for this visit.    Allergies:   Bee venom, Ivp dye [iodinated diagnostic agents], Codeine, Entresto [sacubitril-valsartan], and Shrimp [shellfish allergy]    Social History:  The patient  reports that she has been smoking cigarettes. She has a 5.60 pack-year smoking history. She has never used smokeless tobacco. She reports that she does not drink alcohol and  does not use drugs.   Family History:  [name] family history includes CVA in her sister; Cancer in her mother; Diabetes in her brother; Heart disease in her father; Other in her father; Prostate cancer (age of onset: 66) in her brother.    ROS:  Please see the history of present illness. Otherwise, review of systems are positive for none. All other systems are reviewed and negative.    PHYSICAL EXAM: VS:  There were no vitals taken for this visit. , BMI There is no height or weight on file  to calculate BMI.   General: Well developed, well nourished, NAD Neck: Negative for carotid bruits. No JVD Lungs:Clear to ausculation bilaterally. No wheezes, rales, or rhonchi. Breathing is unlabored. Cardiovascular: RRR with S1 S2. No murmurs Extremities: No edema. Radial pulses 2+ bilaterally Neuro: Alert and oriented. No focal deficits. No facial asymmetry. MAE spontaneously. Psych: Responds to questions appropriately with normal affect.    EKG:  EKG is not ordered today.   Recent Labs: 10/29/2019: Magnesium 1.9 02/10/2020: ALT 11 05/04/2020: B Natriuretic Peptide 530.8; BUN 18; Creatinine, Ser 1.28; Hemoglobin 13.0; Platelets 141; Potassium 3.5; Sodium 141    Lipid Panel    Component Value Date/Time   CHOL 116 11/12/2014 0432   TRIG 111 11/12/2014 0432   HDL 34 (L) 11/12/2014 0432   CHOLHDL 3.4 11/12/2014 0432   VLDL 22 11/12/2014 0432   LDLCALC 60 11/12/2014 0432    Wt Readings from Last 3 Encounters:  03/09/20 (!) 215 lb (97.5 kg)  02/10/20 225 lb (102.1 kg)  12/02/19 227 lb 3.2 oz (103.1 kg)     Other studies Reviewed: Additional studies/ records that were reviewed today include: Review of the above records demonstrates:    Echocardiogram 10/10/2019: Impressions: 1. Left ventricular ejection fraction, by estimation, is 60 to 65%. The  left ventricle has normal function. The left ventricle has no regional  wall motion abnormalities. Left ventricular diastolic  parameters are  indeterminate. Elevated left ventricular  end-diastolic pressure.  2. Right ventricular systolic function is normal. The right ventricular  size is normal. Tricuspid regurgitation signal is inadequate for assessing  PA pressure.  3. The mitral valve is normal in structure and function. No evidence of  mitral valve regurgitation. No evidence of mitral stenosis.  4. The aortic valve has an indeterminant number of cusps. Aortic valve  regurgitation is not visualized. No aortic stenosis is present.  5. The inferior vena cava is normal in size with greater than 50%  respiratory variability, suggesting right atrial pressure of 3 mmHg.    Echcocardiogram 08/03/2018: Study Conclusions: - Left ventricle: The cavity size was mildly dilated. Systolic  function was moderately to severely reduced. The estimated  ejection fraction was in the range of 30% to 35%. Wall motion was  normal; there were no regional wall motion abnormalities. Doppler  parameters are consistent with abnormal left ventricular  relaxation (grade 1 diastolic dysfunction).  - Aortic valve: Transvalvular velocity was within the normal range.  There was no stenosis. There was trivial regurgitation.  - Mitral valve: There was mild regurgitation.  - Left atrium: The atrium was severely dilated.  - Right ventricle: The cavity size was mildly dilated. Wall  thickness was normal. Systolic function was moderately to  severely reduced. RV systolic pressure (S, est): 28 mm Hg.  - Right atrium: The atrium was moderately dilated.  - Inferior vena cava: The vessel was normal in size. The  respirophasic diameter changes were blunted (<50%).  - Pericardium, extracardiac: There was no pericardial effusion.  _______________  Carlton Adam Myoview 09/07/2018:  Nuclear stress EF: 44%.  There was no ST segment deviation noted during stress.  The study is normal.  This is a low risk study.  The left  ventricular ejection fraction is moderately decreased (30-44%).   ASSESSMENT AND PLAN:  1. Atrial Fibrillation with RVR: -Patient reports no further palpitations -Echo showed improved EF of 60-65% with normal wall motion, indeterminate diastolic parameters, and elevated LVEDP. LVEF improved from 30-35% in 07/2018.  -Continue Toprol-XL, Xarelto for  anticoagulation -CHA2DS2-VASc = 5 (CHF, HTN, age >34, CAD, female).   2. CAD s/p CABG in 2016:  -History of CABG x2 (LIMA to LAD and SVG to OM) in 2016.  -Low risk Myoview in 08/2018. -Denies anginal symptoms -Continue BB, statin -No ASA in the setting of Xarelto  3. Chronic Combined CHF/Ishcemic Cardiomyopathy: -Most recent echocardiogram with LV improvement at 60-65% up from 30-35% in 07/2018. -Euvolemic on exam>> continue Lasix -Continue Toprol, Losartan, and Spirolactone   4. Acute anemia with GI bleed: -Hemoglobin 13.0 at last lab draw 05/04/20 -Needs to follow with GI  -Continue Xarelto   5. Hypertension: -Stable, 116/82 -Continue current  regimen  6. Hyperlipidemia: -Continue home Lipitor 10mg  daily  7. Acute on Chronic CKD Stage III -Creatinine 1.28 on last lab draw from 05/04/20  Current medicines are reviewed at length with the patient today.  The patient does not have concerns regarding medicines.  The following changes have been made:  None   Labs/ tests ordered today include: CBC, BMET No orders of the defined types were placed in this encounter.    Disposition:   FU with [name] in 6 months  Signed, Kathyrn Drown, NP  06/16/2020 2:31 PM    [address], [place], [place]  [address] Phone: [phone]; Fax: [phone]

## 2020-06-16 ENCOUNTER — Ambulatory Visit (INDEPENDENT_AMBULATORY_CARE_PROVIDER_SITE_OTHER): Payer: HMO | Admitting: Cardiology

## 2020-06-16 ENCOUNTER — Other Ambulatory Visit: Payer: Self-pay

## 2020-06-16 ENCOUNTER — Encounter: Payer: Self-pay | Admitting: Cardiology

## 2020-06-16 VITALS — BP 116/82 | HR 64 | Ht 67.5 in | Wt 210.0 lb

## 2020-06-16 DIAGNOSIS — I5022 Chronic systolic (congestive) heart failure: Secondary | ICD-10-CM

## 2020-06-16 DIAGNOSIS — Z79899 Other long term (current) drug therapy: Secondary | ICD-10-CM

## 2020-06-16 DIAGNOSIS — Z951 Presence of aortocoronary bypass graft: Secondary | ICD-10-CM | POA: Diagnosis not present

## 2020-06-16 DIAGNOSIS — I25810 Atherosclerosis of coronary artery bypass graft(s) without angina pectoris: Secondary | ICD-10-CM | POA: Diagnosis not present

## 2020-06-16 DIAGNOSIS — I480 Paroxysmal atrial fibrillation: Secondary | ICD-10-CM

## 2020-06-16 DIAGNOSIS — I255 Ischemic cardiomyopathy: Secondary | ICD-10-CM | POA: Diagnosis not present

## 2020-06-16 DIAGNOSIS — I10 Essential (primary) hypertension: Secondary | ICD-10-CM

## 2020-06-16 NOTE — Patient Instructions (Signed)
Medication Instructions:  Your physician recommends that you continue on your current medications as directed. Please refer to the Current Medication list given to you today. *If you need a refill on your cardiac medications before your next appointment, please call your pharmacy*   Lab Work: CBC and BMET today If you have labs (blood work) drawn today and your tests are completely normal, you will receive your results only by: Marland Kitchen MyChart Message (if you have MyChart) OR . A paper copy in the mail If you have any lab test that is abnormal or we need to change your treatment, we will call you to review the results.   Testing/Procedures: None   Follow-Up: At [facility], you and your health needs are our priority.  As part of our continuing mission to provide you with exceptional heart care, we have created designated Provider Care Teams.  These Care Teams include your primary Cardiologist (physician) and Advanced Practice Providers (APPs -  Physician Assistants and Nurse Practitioners) who all work together to provide you with the care you need, when you need it.    Your next appointment:   You are scheduled to see [name] on 09/16/2020 at 2:40 PM.

## 2020-06-17 LAB — BASIC METABOLIC PANEL
BUN/Creatinine Ratio: 14 (ref 12–28)
BUN: 16 mg/dL (ref 8–27)
CO2: 19 mmol/L — ABNORMAL LOW (ref 20–29)
Calcium: 9.2 mg/dL (ref 8.7–10.3)
Chloride: 110 mmol/L — ABNORMAL HIGH (ref 96–106)
Creatinine, Ser: 1.16 mg/dL — ABNORMAL HIGH (ref 0.57–1.00)
GFR calc Af Amer: 53 mL/min/{1.73_m2} — ABNORMAL LOW (ref >59)
GFR calc non Af Amer: 46 mL/min/{1.73_m2} — ABNORMAL LOW (ref >59)
Glucose: 111 mg/dL — ABNORMAL HIGH (ref 65–99)
Potassium: 4 mmol/L (ref 3.5–5.2)
Sodium: 144 mmol/L (ref 134–144)

## 2020-06-17 LAB — CBC
Hematocrit: 45.8 % (ref 34.0–46.6)
Hemoglobin: 15.1 g/dL (ref 11.1–15.9)
MCH: 27.5 pg (ref 26.6–33.0)
MCHC: 33 g/dL (ref 31.5–35.7)
MCV: 83 fL (ref 79–97)
Platelets: 143 10*3/uL — ABNORMAL LOW (ref 150–450)
RBC: 5.5 x10E6/uL — ABNORMAL HIGH (ref 3.77–5.28)
RDW: 16.8 % — ABNORMAL HIGH (ref 11.7–15.4)
WBC: 9.6 10*3/uL (ref 3.4–10.8)

## 2020-06-25 ENCOUNTER — Telehealth: Payer: Self-pay | Admitting: Cardiology

## 2020-06-25 NOTE — Telephone Encounter (Signed)
Pt said she did not get the results from her labs drawn on 06/16/20. Please call to discuss labs.

## 2020-06-26 ENCOUNTER — Inpatient Hospital Stay (HOSPITAL_COMMUNITY)
Admission: EM | Admit: 2020-06-26 | Discharge: 2020-06-30 | DRG: 309 | Disposition: A | Discharge status: Home / Self Care | Payer: HMO | Attending: Cardiology | Admitting: Cardiology

## 2020-06-26 ENCOUNTER — Encounter (HOSPITAL_COMMUNITY): Payer: Self-pay

## 2020-06-26 ENCOUNTER — Emergency Department (HOSPITAL_COMMUNITY): Payer: HMO

## 2020-06-26 ENCOUNTER — Other Ambulatory Visit: Payer: Self-pay

## 2020-06-26 DIAGNOSIS — N183 Chronic kidney disease, stage 3 unspecified: Secondary | ICD-10-CM | POA: Diagnosis not present

## 2020-06-26 DIAGNOSIS — E669 Obesity, unspecified: Secondary | ICD-10-CM | POA: Diagnosis present

## 2020-06-26 DIAGNOSIS — Z951 Presence of aortocoronary bypass graft: Secondary | ICD-10-CM | POA: Diagnosis not present

## 2020-06-26 DIAGNOSIS — K5900 Constipation, unspecified: Secondary | ICD-10-CM | POA: Diagnosis not present

## 2020-06-26 DIAGNOSIS — I5042 Chronic combined systolic (congestive) and diastolic (congestive) heart failure: Secondary | ICD-10-CM | POA: Diagnosis present

## 2020-06-26 DIAGNOSIS — J449 Chronic obstructive pulmonary disease, unspecified: Secondary | ICD-10-CM | POA: Diagnosis present

## 2020-06-26 DIAGNOSIS — Z9119 Patient's noncompliance with other medical treatment and regimen: Secondary | ICD-10-CM

## 2020-06-26 DIAGNOSIS — I5023 Acute on chronic systolic (congestive) heart failure: Secondary | ICD-10-CM | POA: Diagnosis present

## 2020-06-26 DIAGNOSIS — M1990 Unspecified osteoarthritis, unspecified site: Secondary | ICD-10-CM | POA: Diagnosis present

## 2020-06-26 DIAGNOSIS — I4819 Other persistent atrial fibrillation: Principal | ICD-10-CM | POA: Diagnosis present

## 2020-06-26 DIAGNOSIS — E878 Other disorders of electrolyte and fluid balance, not elsewhere classified: Secondary | ICD-10-CM | POA: Diagnosis present

## 2020-06-26 DIAGNOSIS — R0602 Shortness of breath: Secondary | ICD-10-CM | POA: Diagnosis not present

## 2020-06-26 DIAGNOSIS — I5033 Acute on chronic diastolic (congestive) heart failure: Secondary | ICD-10-CM | POA: Diagnosis not present

## 2020-06-26 DIAGNOSIS — I130 Hypertensive heart and chronic kidney disease with heart failure and stage 1 through stage 4 chronic kidney disease, or unspecified chronic kidney disease: Secondary | ICD-10-CM | POA: Diagnosis present

## 2020-06-26 DIAGNOSIS — E872 Acidosis: Secondary | ICD-10-CM | POA: Diagnosis present

## 2020-06-26 DIAGNOSIS — F17210 Nicotine dependence, cigarettes, uncomplicated: Secondary | ICD-10-CM | POA: Diagnosis present

## 2020-06-26 DIAGNOSIS — K219 Gastro-esophageal reflux disease without esophagitis: Secondary | ICD-10-CM | POA: Diagnosis present

## 2020-06-26 DIAGNOSIS — R062 Wheezing: Secondary | ICD-10-CM | POA: Diagnosis not present

## 2020-06-26 DIAGNOSIS — Z91041 Radiographic dye allergy status: Secondary | ICD-10-CM

## 2020-06-26 DIAGNOSIS — I2583 Coronary atherosclerosis due to lipid rich plaque: Secondary | ICD-10-CM | POA: Diagnosis present

## 2020-06-26 DIAGNOSIS — I10 Essential (primary) hypertension: Secondary | ICD-10-CM | POA: Diagnosis not present

## 2020-06-26 DIAGNOSIS — I480 Paroxysmal atrial fibrillation: Secondary | ICD-10-CM

## 2020-06-26 DIAGNOSIS — Z20822 Contact with and (suspected) exposure to covid-19: Secondary | ICD-10-CM | POA: Diagnosis present

## 2020-06-26 DIAGNOSIS — I340 Nonrheumatic mitral (valve) insufficiency: Secondary | ICD-10-CM | POA: Diagnosis not present

## 2020-06-26 DIAGNOSIS — N1831 Chronic kidney disease, stage 3a: Secondary | ICD-10-CM | POA: Diagnosis present

## 2020-06-26 DIAGNOSIS — E1122 Type 2 diabetes mellitus with diabetic chronic kidney disease: Secondary | ICD-10-CM | POA: Diagnosis present

## 2020-06-26 DIAGNOSIS — I361 Nonrheumatic tricuspid (valve) insufficiency: Secondary | ICD-10-CM | POA: Diagnosis not present

## 2020-06-26 DIAGNOSIS — Z7984 Long term (current) use of oral hypoglycemic drugs: Secondary | ICD-10-CM | POA: Diagnosis not present

## 2020-06-26 DIAGNOSIS — Z79899 Other long term (current) drug therapy: Secondary | ICD-10-CM

## 2020-06-26 DIAGNOSIS — Z6832 Body mass index (BMI) 32.0-32.9, adult: Secondary | ICD-10-CM

## 2020-06-26 DIAGNOSIS — Z8674 Personal history of sudden cardiac arrest: Secondary | ICD-10-CM | POA: Diagnosis not present

## 2020-06-26 DIAGNOSIS — I4891 Unspecified atrial fibrillation: Secondary | ICD-10-CM | POA: Diagnosis present

## 2020-06-26 DIAGNOSIS — I2510 Atherosclerotic heart disease of native coronary artery without angina pectoris: Secondary | ICD-10-CM | POA: Diagnosis present

## 2020-06-26 DIAGNOSIS — Z7901 Long term (current) use of anticoagulants: Secondary | ICD-10-CM

## 2020-06-26 DIAGNOSIS — E785 Hyperlipidemia, unspecified: Secondary | ICD-10-CM | POA: Diagnosis present

## 2020-06-26 DIAGNOSIS — I255 Ischemic cardiomyopathy: Secondary | ICD-10-CM | POA: Diagnosis present

## 2020-06-26 DIAGNOSIS — R531 Weakness: Secondary | ICD-10-CM | POA: Diagnosis not present

## 2020-06-26 DIAGNOSIS — Z91013 Allergy to seafood: Secondary | ICD-10-CM

## 2020-06-26 DIAGNOSIS — Z885 Allergy status to narcotic agent status: Secondary | ICD-10-CM

## 2020-06-26 DIAGNOSIS — M109 Gout, unspecified: Secondary | ICD-10-CM | POA: Diagnosis present

## 2020-06-26 DIAGNOSIS — Z91030 Bee allergy status: Secondary | ICD-10-CM

## 2020-06-26 DIAGNOSIS — E66811 Obesity, class 1: Secondary | ICD-10-CM | POA: Diagnosis present

## 2020-06-26 DIAGNOSIS — Z888 Allergy status to other drugs, medicaments and biological substances status: Secondary | ICD-10-CM

## 2020-06-26 HISTORY — DX: Paroxysmal atrial fibrillation: I48.0

## 2020-06-26 HISTORY — DX: Unspecified diastolic (congestive) heart failure: I50.30

## 2020-06-26 HISTORY — DX: Disorder of arteries and arterioles, unspecified: I77.9

## 2020-06-26 LAB — BASIC METABOLIC PANEL
Anion gap: 12 (ref 5–15)
BUN: 23 mg/dL (ref 8–23)
CO2: 16 mmol/L — ABNORMAL LOW (ref 22–32)
Calcium: 9.2 mg/dL (ref 8.9–10.3)
Chloride: 113 mmol/L — ABNORMAL HIGH (ref 98–111)
Creatinine, Ser: 1.31 mg/dL — ABNORMAL HIGH (ref 0.44–1.00)
GFR, Estimated: 42 mL/min — ABNORMAL LOW (ref >60)
Glucose, Bld: 125 mg/dL — ABNORMAL HIGH (ref 70–99)
Potassium: 3.6 mmol/L (ref 3.5–5.1)
Sodium: 141 mmol/L (ref 135–145)

## 2020-06-26 LAB — RESPIRATORY PANEL BY RT PCR (FLU A&B, COVID)
Influenza A by PCR: NEGATIVE
Influenza B by PCR: NEGATIVE
SARS Coronavirus 2 by RT PCR: NEGATIVE

## 2020-06-26 LAB — CBG MONITORING, ED: Glucose-Capillary: 113 mg/dL — ABNORMAL HIGH (ref 70–99)

## 2020-06-26 LAB — CBC
HCT: 44.7 % (ref 36.0–46.0)
Hemoglobin: 14.2 g/dL (ref 12.0–15.0)
MCH: 27.5 pg (ref 26.0–34.0)
MCHC: 31.8 g/dL (ref 30.0–36.0)
MCV: 86.5 fL (ref 80.0–100.0)
Platelets: 158 10*3/uL (ref 150–400)
RBC: 5.17 MIL/uL — ABNORMAL HIGH (ref 3.87–5.11)
RDW: 17.7 % — ABNORMAL HIGH (ref 11.5–15.5)
WBC: 8.3 10*3/uL (ref 4.0–10.5)
nRBC: 0 % (ref 0.0–0.2)

## 2020-06-26 LAB — BRAIN NATRIURETIC PEPTIDE: B Natriuretic Peptide: 927.9 pg/mL — ABNORMAL HIGH (ref 0.0–100.0)

## 2020-06-26 LAB — PROTIME-INR
INR: 2.1 — ABNORMAL HIGH (ref 0.8–1.2)
Prothrombin Time: 23.2 seconds — ABNORMAL HIGH (ref 11.4–15.2)

## 2020-06-26 LAB — TROPONIN I (HIGH SENSITIVITY)
Troponin I (High Sensitivity): 29 ng/L — ABNORMAL HIGH (ref <18)
Troponin I (High Sensitivity): 33 ng/L — ABNORMAL HIGH (ref <18)

## 2020-06-26 MED ORDER — SPIRONOLACTONE 25 MG PO TABS
25.0000 mg | ORAL_TABLET | Freq: Every day | ORAL | Status: DC
  Administered 2020-06-27 – 2020-06-30 (×4): 25 mg via ORAL
  Filled 2020-06-26 (×4): qty 1

## 2020-06-26 MED ORDER — LACTATED RINGERS IV BOLUS
500.0000 mL | Freq: Once | INTRAVENOUS | Status: AC
  Administered 2020-06-26: 500 mL via INTRAVENOUS

## 2020-06-26 MED ORDER — METOPROLOL SUCCINATE ER 50 MG PO TB24
50.0000 mg | ORAL_TABLET | Freq: Every day | ORAL | Status: DC
  Administered 2020-06-26 – 2020-06-27 (×2): 50 mg via ORAL
  Filled 2020-06-26: qty 2
  Filled 2020-06-26: qty 1

## 2020-06-26 MED ORDER — ONDANSETRON HCL 4 MG/2ML IJ SOLN
4.0000 mg | Freq: Four times a day (QID) | INTRAMUSCULAR | Status: DC | PRN
  Administered 2020-06-30: 4 mg via INTRAVENOUS
  Filled 2020-06-26: qty 2

## 2020-06-26 MED ORDER — DILTIAZEM HCL-DEXTROSE 125-5 MG/125ML-% IV SOLN (PREMIX)
5.0000 mg/h | INTRAVENOUS | Status: DC
  Administered 2020-06-26 – 2020-06-27 (×2): 5 mg/h via INTRAVENOUS
  Administered 2020-06-27: 10 mg/h via INTRAVENOUS
  Administered 2020-06-28 (×2): 15 mg/h via INTRAVENOUS
  Administered 2020-06-28: 10 mg/h via INTRAVENOUS
  Administered 2020-06-29: 15 mg/h via INTRAVENOUS
  Filled 2020-06-26 (×7): qty 125

## 2020-06-26 MED ORDER — FUROSEMIDE 10 MG/ML IJ SOLN
40.0000 mg | Freq: Once | INTRAMUSCULAR | Status: AC
  Administered 2020-06-26: 40 mg via INTRAVENOUS
  Filled 2020-06-26: qty 4

## 2020-06-26 MED ORDER — INSULIN ASPART 100 UNIT/ML ~~LOC~~ SOLN
0.0000 [IU] | Freq: Three times a day (TID) | SUBCUTANEOUS | Status: DC
  Administered 2020-06-27 – 2020-06-30 (×5): 2 [IU] via SUBCUTANEOUS

## 2020-06-26 MED ORDER — METOPROLOL TARTRATE 5 MG/5ML IV SOLN
5.0000 mg | Freq: Once | INTRAVENOUS | Status: AC
  Administered 2020-06-26: 5 mg via INTRAVENOUS

## 2020-06-26 MED ORDER — FUROSEMIDE 10 MG/ML IJ SOLN
60.0000 mg | Freq: Two times a day (BID) | INTRAMUSCULAR | Status: DC
  Administered 2020-06-27 – 2020-06-29 (×6): 60 mg via INTRAVENOUS
  Filled 2020-06-26 (×6): qty 6

## 2020-06-26 MED ORDER — METOPROLOL TARTRATE 5 MG/5ML IV SOLN
5.0000 mg | Freq: Once | INTRAVENOUS | Status: AC
  Administered 2020-06-26: 5 mg via INTRAVENOUS
  Filled 2020-06-26: qty 5

## 2020-06-26 MED ORDER — LOSARTAN POTASSIUM 25 MG PO TABS: 12.5000 mg | ORAL_TABLET | ORAL | Status: DC

## 2020-06-26 MED ORDER — RIVAROXABAN 15 MG PO TABS: 15.0000 mg | ORAL_TABLET | Freq: Every day | ORAL | Status: DC

## 2020-06-26 MED ORDER — METOPROLOL TARTRATE 5 MG/5ML IV SOLN: 5.0000 mg | Freq: Once | INTRAVENOUS | Status: AC

## 2020-06-26 MED ORDER — LOSARTAN POTASSIUM 25 MG PO TABS
25.0000 mg | ORAL_TABLET | Freq: Every day | ORAL | Status: DC
  Administered 2020-06-27 – 2020-06-29 (×3): 25 mg via ORAL
  Filled 2020-06-26 (×3): qty 1

## 2020-06-26 MED ORDER — SODIUM BICARBONATE 650 MG PO TABS
650.0000 mg | ORAL_TABLET | Freq: Two times a day (BID) | ORAL | Status: DC
  Administered 2020-06-26 – 2020-06-30 (×8): 650 mg via ORAL
  Filled 2020-06-26 (×8): qty 1

## 2020-06-26 MED ORDER — RIVAROXABAN 20 MG PO TABS
20.0000 mg | ORAL_TABLET | Freq: Every day | ORAL | Status: DC
  Administered 2020-06-26 – 2020-06-29 (×4): 20 mg via ORAL
  Filled 2020-06-26 (×4): qty 1

## 2020-06-26 MED ORDER — METOPROLOL TARTRATE 5 MG/5ML IV SOLN
INTRAVENOUS | Status: AC
  Administered 2020-06-26: 5 mg via INTRAVENOUS
  Filled 2020-06-26: qty 5

## 2020-06-26 MED ORDER — METOPROLOL TARTRATE 5 MG/5ML IV SOLN
INTRAVENOUS | Status: AC
  Filled 2020-06-26: qty 5

## 2020-06-26 MED ORDER — LOSARTAN POTASSIUM 25 MG PO TABS
12.5000 mg | ORAL_TABLET | Freq: Every day | ORAL | Status: DC
  Administered 2020-06-26 – 2020-06-28 (×3): 12.5 mg via ORAL
  Filled 2020-06-26 (×3): qty 1

## 2020-06-26 MED ORDER — DILTIAZEM HCL 25 MG/5ML IV SOLN
10.0000 mg | Freq: Once | INTRAVENOUS | Status: AC
  Administered 2020-06-26: 10 mg via INTRAVENOUS
  Filled 2020-06-26: qty 5

## 2020-06-26 MED ORDER — ACETAMINOPHEN 325 MG PO TABS
650.0000 mg | ORAL_TABLET | ORAL | Status: DC | PRN
  Administered 2020-06-27 – 2020-06-29 (×3): 650 mg via ORAL
  Filled 2020-06-26 (×3): qty 2

## 2020-06-26 NOTE — Progress Notes (Signed)
   06/26/20 1945  Assess: MEWS Score  Temp 98.3 F (36.8 C)  BP (!) 127/91  Pulse Rate (!) 118  Resp 18  Level of Consciousness Alert  SpO2 97 %  O2 Device Room Air  Patient Activity (if Appropriate) In bed  Assess: MEWS Score  MEWS Temp 0  MEWS Systolic 0  MEWS Pulse 2  MEWS RR 0  MEWS LOC 0  MEWS Score 2  MEWS Score Color Yellow  Assess: if the MEWS score is Yellow or Red  Were vital signs taken at a resting state? Yes  Focused Assessment No change from prior assessment  Early Detection of Sepsis Score *See Row Information* Medium  MEWS guidelines implemented *See Row Information* Yes  Treat  Pain Scale 0-10  Pain Score 0  Take Vital Signs  Increase Vital Sign Frequency  Yellow: Q 2hr X 2 then Q 4hr X 2, if remains yellow, continue Q 4hrs  Escalate  MEWS: Escalate Yellow: discuss with charge nurse/RN and consider discussing with provider and RRT  Notify: Charge Nurse/RN  Name of Charge Nurse/RN Notified Monica, RN  Date Charge Nurse/RN Notified 06/26/20  Time Charge Nurse/RN Notified 2020  Document  Patient Outcome Stabilized after interventions

## 2020-06-26 NOTE — ED Triage Notes (Signed)
Pt presents with heart racing and feeling bad x1 week. Pt reports shed just feels tired out

## 2020-06-26 NOTE — Consult Note (Signed)
Cardiology Consult    Patient ID: ILMA ACHEE MRN: [id number], DOB/AGE: 01-12-1946   Admit date: 06/26/2020 Date of Consult: 06/26/2020  Primary Physician: [name], MD Primary Cardiologist: [name], MD Requesting Provider: R. Lonny Prude, MD   Patient Profile    [name] [name] is a 74 y.o. female with a history of CAD s/p CABG, HFimpEF (EF 55-60% 11/2019), ICM, HTN, HL, CKD III, COPD, ongoing tob abuse, PAF on xarelto, prior GIB, and type II DM, who is being seen today for the evaluation of Afib RVR and volume overlolad at the request of Dr. Lonny Prude.  Past Medical History   Past Medical History:  Diagnosis Date  . (HFimpEF) heart failure with improved ejection fraction ([place])    a. 07/2018 Echo: EF 30-35%; b. 11/2019 Echo: EF 55-60%, no rwma, Gr2 DD, Nl RV size/fxn. Mild BAE. Mild MR/AI.  Marland Kitchen Arthritis   . Asthma    ??  . Atopic dermatitis   . CAD (coronary artery disease)    a. 2016 s/p CABG x 2 (LIMA->LAD, VG->OM); b. 08/2018 MV: EF 44%, no ischemia/infact.  . Cardiac arrest ([place]) 10/2014  . Cardiomyopathy, ischemic    a. 07/2018 Echo: EF 30-35%; 11/2019 Echo: EF 55-60%.  . Carotid arterial disease ([place])    a. 11/2019 Carotid U/S  . CKD (chronic kidney disease), stage III ([place])   . COPD (chronic obstructive pulmonary disease) ([place])   . Esophageal dilatation 2013  . GERD (gastroesophageal reflux disease)   . Gout   . Headache   . Hypertension   . Hypokalemia   . Idiopathic angioedema   . LGI bleed 08/06/2017   a. felt to be hemorrhoidal during that admission (no drop in Hgb).  . Lower back pain   . Persistent atrial fibrillation ([place])    a. Dx 2016-->h/o difficult to control rates (complicated by noncompliance), not felt to be a candidate for ablation or antiarrhythmic due to noncompliance; b. Recurrent AF 2021 - converted w/ IV dilt; c. CHA2DS2VASc = 6-->Xarelto.  . Personal history of noncompliance with medical treatment, presenting hazards to health   . Prediabetes    . S/P CABG x 2 with clipping of LA appendage 11/14/2014   LIMA to LAD, SVG to OM, EVH via right thigh  . Urine incontinence   . Uterine fibroid     Past Surgical History:  Procedure Laterality Date  . BALLOON DILATION N/A 10/10/2019   Procedure: BALLOON DILATION;  Surgeon: Otis Brace, MD;  Location: [facility];  Service: Gastroenterology;  Laterality: N/A;  . BIOPSY  10/10/2019   Procedure: BIOPSY;  Surgeon: Otis Brace, MD;  Location: [place];  Service: Gastroenterology;;  . BIOPSY  10/11/2019   Procedure: BIOPSY;  Surgeon: Otis Brace, MD;  Location: [place];  Service: Gastroenterology;;  . CARDIOVERSION N/A 11/18/2014   Procedure: CARDIOVERSION;  Surgeon: [name], MD;  Location: [place];  Service: Cardiovascular;  Laterality: N/A;  . CARDIOVERSION N/A 12/25/2017   Procedure: CARDIOVERSION;  Surgeon: [name], MD;  Location: [place];  Service: Cardiovascular;  Laterality: N/A;  . CLIPPING OF ATRIAL APPENDAGE N/A 11/14/2014   Procedure: CLIPPING OF ATRIAL APPENDAGE;  Surgeon: [name], MD;  Location: [place];  Service: Open Heart Surgery;  Laterality: N/A;  . COLONOSCOPY  2013  . COLONOSCOPY WITH PROPOFOL N/A 10/11/2019   Procedure: COLONOSCOPY WITH PROPOFOL;  Surgeon: Otis Brace, MD;  Location: [place];  Service: Gastroenterology;  Laterality: N/A;  . CORONARY ARTERY BYPASS GRAFT  N/A 11/14/2014   Procedure: CORONARY ARTERY BYPASS GRAFTING (CABG)TIMES 2 USING LEFT INTERNAL MAMMARY ARTERY AND RIGHT SAPHENOUS VEIN HARVESTED ENDOSCOPICALLY;  Surgeon: [name], MD;  Location: [place];  Service: Open Heart Surgery;  Laterality: N/A;  . ESOPHAGOGASTRODUODENOSCOPY (EGD) WITH PROPOFOL N/A 10/10/2019   Procedure: ESOPHAGOGASTRODUODENOSCOPY (EGD) WITH PROPOFOL;  Surgeon: Otis Brace, MD;  Location: [facility];  Service: Gastroenterology;  Laterality: N/A;  . ESOPHAGOGASTRODUODENOSCOPY (EGD) WITH PROPOFOL N/A 10/27/2019    Procedure: ESOPHAGOGASTRODUODENOSCOPY (EGD) WITH PROPOFOL;  Surgeon: Ronnette Juniper, MD;  Location: [place];  Service: Gastroenterology;  Laterality: N/A;  . GIVENS CAPSULE STUDY N/A 10/27/2019   Procedure: GIVENS CAPSULE STUDY;  Surgeon: Ronnette Juniper, MD;  Location: [place];  Service: Gastroenterology;  Laterality: N/A;  . LEFT HEART CATHETERIZATION WITH CORONARY ANGIOGRAM N/A 11/04/2014   Procedure: LEFT HEART CATHETERIZATION WITH CORONARY ANGIOGRAM;  Surgeon: Troy Sine, MD;  Location: [facility] CATH LAB;  Service: Cardiovascular;  Laterality: N/A;  . POLYPECTOMY  10/11/2019   Procedure: POLYPECTOMY;  Surgeon: Otis Brace, MD;  Location: [place] [facility];  Service: Gastroenterology;;  . TEE WITHOUT CARDIOVERSION N/A 11/14/2014   Procedure: TRANSESOPHAGEAL ECHOCARDIOGRAM (TEE);  Surgeon: [name], MD;  Location: [place];  Service: Open Heart Surgery;  Laterality: N/A;  . TEE WITHOUT CARDIOVERSION N/A 12/25/2017   Procedure: TRANSESOPHAGEAL ECHOCARDIOGRAM (TEE);  Surgeon: [name], MD;  Location: [facility] ENDOSCOPY;  Service: Cardiovascular;  Laterality: N/A;  . TEMPORARY PACEMAKER INSERTION  11/04/2014   Procedure: TEMPORARY PACEMAKER INSERTION;  Surgeon: Troy Sine, MD;  Location: [facility] CATH LAB;  Service: Cardiovascular;;  . TOOTH EXTRACTION       Allergies  Allergies  Allergen Reactions  . Bee Venom Anaphylaxis  . Ivp Dye [Iodinated Diagnostic Agents] Anaphylaxis  . Codeine Nausea And Vomiting  . Entresto [Sacubitril-Valsartan]     Chest pain   . Shrimp [Shellfish Allergy] Swelling    History of Present Illness    74 year old female with above complex past medical history including coronary disease, heart failure with improved ejection fraction, ischemic retinopathy, hypertension, hyperlipidemia, stage III chronic kidney disease, COPD, ongoing tobacco abuse, paroxysmal atrial fibrillation on Xarelto, prior GI bleed, and type 2 diabetes mellitus.  She was initially  evaluated for atrial fibrillation in April 2016 found to have a severely reduced LV function.  Subsequent ischemic work-up revealed severe multivessel CAD and she underwent CABG x2 with the LIMA to the LAD and vein graft to the obtuse marginal.  EF was as low as 30 to 35% in December 2019.  She was unable to tolerate Entresto in the past due to chest pain.  Compliance has always been an issue as well.  She underwent stress testing in January 2020 which was low risk and showed no reversible ischemia with an EF of 44%.  In February 2021, she was seen in the emergency department secondary to recurrent atrial fibrillation.  At the time, she was off of Xarelto in the setting of dark stools.  She was treated with IV diltiazem with subsequent conversion to sinus rhythm.  After being seen by GI, Xarelto was resumed.  She was never felt to be a suitable candidate for cardioversion or catheter ablation secondary to history of medication noncompliance.  Echocardiogram in April 2021 showed improvement in LV function with an EF of 55 to 60%.  She was seen in the emergency department in September for bilateral lower extremity edema.  She was advised to take an extra dose of Lasix and follow-up  with cardiology, which she did on November 2.  At that time, she was doing reasonably well without anginal symptoms or significant dyspnea.  She was euvolemic on examination with stable vital signs.  Following cardiology visit November 2, patient says that she began to notice increasing dyspnea.  She does not routinely weigh herself at home and reports relatively good compliance but notes that she sometimes misses evening doses of medications if she is out with her friends.  She misses Xarelto at least 1 or 2 times a month.  Beginning 2 days ago, Wednesday, November 10, she started noticing increasing dyspnea and also palpitations when lying down at bed at night.  She did go out with her friends Wednesday evening and says that she was  short of breath but this did not stop her from having a good time.  Unfortunately, yesterday and then again today, she noted significant dyspnea on exertion and weakness.  Is for this reason, that she presented to the emergency department today where she was found to be in atrial fibrillation with rapid ventricular response and a rate of 175.  Lab work notable for mild high-sensitivity troponin elevation (29  33), BNP of 927.9, mild elevation of BUN and creatinine above most recent recording at 23/1.31, and acidosis with a bicarb of 16.  Chest x-ray was read as stable exam without acute process.  Patient is currently on IV diltiazem at 7.5 mg/h and improvement in heart rates down to the 1 teens to 120s.  On arrival, she was given a 500 cc bolus of lactated Ringer's.  2 hours later, she was given with 40 mg of IV Lasix.  Currently, she is resting comfortably.  She does report 2 episodes of brief/fleeting chest pain over the past week.  No chest pain today.  Home Medications    Prior to Admission medications   Medication Sig Start Date End Date Taking? Authorizing Provider  acetaminophen (TYLENOL) 500 MG tablet Take 500 mg by mouth every 6 (six) hours as needed for moderate pain.    Yes [provider]  allopurinol (ZYLOPRIM) 300 MG tablet Take 1 tablet (300 mg total) by mouth daily. 01/04/18  Yes Georgiana Shore, NP  atorvastatin (LIPITOR) 10 MG tablet Take 5-10 mg by mouth at bedtime.    Yes [provider]  Coenzyme Q10 (CO Q-10 PO) Take 1 capsule by mouth daily.    Yes [provider]  furosemide (LASIX) 20 MG tablet Take 1 tablet (20 mg total) by mouth daily. 02/20/20  Yes Jerline Pain, MD  losartan (COZAAR) 25 MG tablet Take 12.5-25 mg by mouth See admin instructions. Take 25 mg by mouth in the morning and 12.5 mg at bedtime   Yes [provider]  metoprolol succinate (TOPROL-XL) 50 MG 24 hr tablet TAKE ONE TABLET BY MOUTH ONCE DAILY WITH OR immediately following A  meal 12/12/19  Yes Bensimhon, Shaune Pascal, MD  Empagliflozin (JARDIANCE PO) Take 1 tablet by mouth.    [provider]  metFORMIN (GLUCOPHAGE) 500 MG tablet Take 500 mg by mouth daily.    [provider]  pantoprazole (PROTONIX) 40 MG tablet Take 40 mg by mouth as needed.    [provider]  rivaroxaban (XARELTO) 20 MG TABS tablet Take 1 tablet (20 mg total) by mouth daily with supper. Resume from 11/01/2019 11/01/19   Aline August, MD  senna-docusate (SENOKOT-S) 8.6-50 MG tablet Take 1 tablet by mouth 2 (two) times daily. 10/29/19   Starla Link,  Kshitiz, MD  spironolactone (ALDACTONE) 25 MG tablet Take 1 tablet (25 mg total) by mouth daily. 12/02/19 06/26/20  Kathyrn Drown D, NP  triamcinolone ointment (KENALOG) 0.5 % Apply 1 application topically 2 (two) times daily as needed (itching, rash).  11/15/19   [provider]     Family History    Family History  Problem Relation Age of Onset  . Cancer Mother        LYMPHOMA  . Heart disease Father   . Other Father        TB  . CVA Sister   . Prostate cancer Brother 85  . Diabetes Brother    She indicated that her mother is deceased. She indicated that her father is deceased. She indicated that both of her sisters are alive. She indicated that both of her brothers are alive. She indicated that her maternal grandmother is deceased. She indicated that her maternal grandfather is deceased. She indicated that her paternal grandmother is deceased. She indicated that her paternal grandfather is deceased.   Social History    Social History   Socioeconomic History  . Marital status: Widowed    Spouse name: Not on file  . Number of children: 1  . Years of education: 24  . Highest education level: Not on file  Occupational History  . Occupation: RETIRED [employer]  Tobacco Use  . Smoking status: Current Some Day Smoker    Packs/day: 0.10    Years: 56.00    Pack years: 5.60    Types: Cigarettes  .  Smokeless tobacco: Never Used  . Tobacco comment: "I might still take a puff now and then"  Vaping Use  . Vaping Use: Never used  Substance and Sexual Activity  . Alcohol use: No    Alcohol/week: 0.0 standard drinks  . Drug use: No  . Sexual activity: Not on file  Other Topics Concern  . Not on file  Social History Narrative   Patient reports it being difficult to pay for everything due to outstanding medical bills from hospital stay last year but states she is able to keep up with basic expenses.  Patient does have some concerns with her house's condition due to a water leak- had her roof repaired last month but has some leaking in the front which has affected her porch.  Patient owns her own car and is able to drive herself but has some concerns about the reliability of her car- has gotten taxi to the clinic for appointment in the past when her car broke down.   Social Determinants of Health   Financial Resource Strain:   . Difficulty of Paying Living Expenses: Not on file  Food Insecurity:   . Worried About Charity fundraiser in the Last Year: Not on file  . Ran Out of Food in the Last Year: Not on file  Transportation Needs:   . Lack of Transportation (Medical): Not on file  . Lack of Transportation (Non-Medical): Not on file  Physical Activity:   . Days of Exercise per Week: Not on file  . Minutes of Exercise per Session: Not on file  Stress:   . Feeling of Stress : Not on file  Social Connections:   . Frequency of Communication with Friends and Family: Not on file  . Frequency of Social Gatherings with Friends and Family: Not on file  . Attends Religious Services: Not on file  . Active Member of Clubs or Organizations: Not on file  .  Attends Archivist Meetings: Not on file  . Marital Status: Not on file  Intimate Partner Violence:   . Fear of Current or Ex-Partner: Not on file  . Emotionally Abused: Not on file  . Physically Abused: Not on file  . Sexually  Abused: Not on file     Review of Systems    General:  No chills, fever, night sweats or weight changes.  Cardiovascular: 2 fleeting episodes of chest pain over the past few days. +++ dyspnea on exertion, no edema, +++ orthopnea,  +++ tachypalpitations, no paroxysmal nocturnal dyspnea. Dermatological: No rash, lesions/masses Respiratory: No cough, +++ dyspnea, +++ wheezing recently. Urologic: No hematuria, dysuria Abdominal:   No nausea, vomiting, diarrhea, bright red blood per rectum, melena, or hematemesis Neurologic:  No visual changes, wkns, changes in mental status. All other systems reviewed and are otherwise negative except as noted above.  Physical Exam    Blood pressure (!) 114/93, pulse 85, temperature 98.3 F (36.8 C), temperature source Oral, resp. rate (!) 29, height 5' 7.5" (1.715 m), weight 95.3 kg, SpO2 94 %.  General: Pleasant, NAD Psych: Normal affect. Neuro: Alert and oriented X 3. Moves all extremities spontaneously. HEENT: Normal  Neck: Supple without bruits.  Mod elev JVP. Lungs:  Resp regular and unlabored, Diminished breath sounds throughout - more so @ bases - moving very little air.  No active wheezing. Heart: IR, IR, tachy, no s3, s4, or murmurs. Abdomen: Obese, soft, non-tender, non-distended, BS + x 4.  Extremities: No clubbing, cyanosis.  Trace bilat ankle edema. DP/PT/Radials 2+ and equal bilaterally.  Labs    Cardiac Enzymes Recent Labs  Lab 06/26/20 1423 06/26/20 1608  TROPONINIHS 29* 33*      Lab Results  Component Value Date   WBC 8.3 06/26/2020   HGB 14.2 06/26/2020   HCT 44.7 06/26/2020   MCV 86.5 06/26/2020   PLT 158 06/26/2020    Recent Labs  Lab 06/26/20 1423  NA 141  K 3.6  CL 113*  CO2 16*  BUN 23  CREATININE 1.31*  CALCIUM 9.2  GLUCOSE 125*   Lab Results  Component Value Date   CHOL 116 11/12/2014   HDL 34 (L) 11/12/2014   LDLCALC 60 11/12/2014   TRIG 111 11/12/2014   Lab Results  Component Value Date    DDIMER 0.36 09/21/2017     Radiology Studies    DG Chest Portable 1 View  Result Date: 06/26/2020 CLINICAL DATA:  Tachycardia, weakness for 1 week, wheezing EXAM: PORTABLE CHEST 1 VIEW COMPARISON:  05/04/2020 FINDINGS: Single frontal view of the chest demonstrates a stable cardiac silhouette. Postsurgical changes from median sternotomy. No airspace disease, effusion, or pneumothorax. No acute bony abnormalities. IMPRESSION: 1. Stable exam, no acute process. Electronically Signed   By: Randa Ngo M.D.   On: 06/26/2020 15:13    ECG & Cardiac Imaging    Afib, 175, PVC, left axis deviation, lateral ST depression- personally reviewed. Telemetry shows persistent atrial fibrillation with rates in the 1 teens to 130s currently  Assessment & Plan    1.  Atrial fibrillation with rapid ventricular response: Patient with a history of paroxysmal atrial fibrillation dating back to 2016 with admission in February of this year due to recurrence.  She broke with IV diltiazem at that time.  She has been on oral metoprolol therapy and Xarelto at home though does admit to some noncompliance with both and believes that she missed her Xarelto 2 nights ago.  She  began noticing tachypalpitations approximately 2 days ago associated with progressive dyspnea and weakness thus presented to the ED today where she was found to be in A. fib with RVR at a rate of 175 bpm.  She is currently on IV diltiazem 7.5 mg/h with significant improvement in rate, currently trending in the 1 teens to 130 range.  Recommend continuation of IV diltiazem and titration of dose as tolerated as well as continuation of oral anticoagulation (CHA2DS2-VASc equals 7).  Suspect noncompliance may be playing a role in recurrent A. fib though COPD and heart failure may also be having an impact.  Provided that she converts on IV diltiazem, would plan to continue oral beta-blocker therapy but must also strongly consider initiation of an oral antiarrhythmic  given 2 hospitalizations this year and development of heart failure when in A. Fib.  2.  Acute on chronic heart failure with improved ejection fraction/ischemic cardiomyopathy: Patient with EF previously as low as 30 to 35% in 2019 with subsequent improvement to 55-60% by echo in April of this year.  She has some degree of chronic dyspnea on exertion but was doing well at November 2 visit.  Over the past week, she began to experience increasing dyspnea on exertion over the past 2 days, she has noted more profound dyspnea on exertion and orthopnea.  On examination, she has very little air movement though I do not appreciate crackles.  She does have JVD and trace ankle edema.  Her BNP is elevated at 927.9.  She initially was treated with a 500 mL bolus of lactated Ringer's and subsequently given Lasix 40 mg IV x1.  Will write for Lasix 60 mg IV twice daily for the time being and reassess volume in the a.m.  Unclear at this point if heart rates contribute to heart failure or heart failure contributed to recurrence of A. fib.  Either way, she would likely benefit from maintenance of sinus rhythm, that this will largely depend on compliance in the long run.  In addition to beta-blocker therapy continue spironolactone, ARB, and Jardiance.  3.  Coronary artery disease/demand ischemia: Status post prior two-vessel bypass.  Nonischemic Myoview in 2020.  She did have 2 episodes of chest pain over the past few days though these were fleeting in nature.  In the setting of rapid A. fib and volume overload, troponins are mildly elevated at 29 and 33.  Further, she did have ST depression involving the lateral leads when heart rate was 175.  She is currently symptom-free.  Suspect demand ischemia.  Repeat echo.  Would consider repeat ischemic evaluation if troponins trend much higher or LV dysfunction recurs.  No aspirin in the setting of Xarelto therapy.  Continue beta-blocker, ARB, and statin.  4.  Type 2 diabetes mellitus:  Metformin and Jardiance per internal medicine.  5.  Essential hypertension: Stable.  Continue home medications.  6.  Hyperlipidemia: Continue statin therapy.  F/u lipids.  7.  Metabolic abnormalities: Labs notable for hyperchloremia and acidosis.  Consider work-up for renal tubular acidosis.  Defer to internal medicine.  Follow labs with diuresis.  8.  CKD III:  Follow.  Signed, Murray Hodgkins, NP 06/26/2020, 6:49 PM  For questions or updates, please contact   Please consult [email or web address] for contact info under Cardiology/STEMI.

## 2020-06-26 NOTE — ED Provider Notes (Signed)
[name] [name]   CSN: [id number] Arrival date & time: 06/26/20  1356     History Chief Complaint  Patient presents with  . Weakness  . Palpitations    [name] [name] is a 74 y.o. female.   Palpitations Palpitations quality:  Irregular Onset quality:  Gradual Duration:  1 week Timing:  Intermittent Progression:  Waxing and waning Chronicity:  New Relieved by:  Nothing Worsened by:  Nothing Ineffective treatments:  None tried Associated symptoms: chest pain (rare)   Associated symptoms: no back pain, no cough, no diaphoresis, no nausea, no near-syncope, no shortness of breath and no vomiting   Risk factors: diabetes mellitus        Past Medical History:  Diagnosis Date  . Arthritis   . Asthma    ??  . Atopic dermatitis   . Cardiac arrest ([place]) 10/2014   [name]  . Cardiomyopathy, ischemic    a. EF previously low, improved to LVEF 50-55% as of May 2018  . Chronic combined systolic and diastolic heart failure ([place])   . CKD (chronic kidney disease), stage III ([place])   . COPD (chronic obstructive pulmonary disease) ([place])   . Esophageal dilatation 2013  . GERD (gastroesophageal reflux disease)   . Gout   . Headache   . Hypertension   . Hypokalemia   . Idiopathic angioedema   . LGI bleed 08/06/2017   a. felt to be hemorrhoidal during that admission (no drop in Hgb).  . Lower back pain   . Persistent atrial fibrillation (HCC)    a. h/o difficult to control rates (complicated by noncompliance), not felt to be a candidate for ablation or antiarrhythmic due to noncompliance.  . Personal history of noncompliance with medical treatment, presenting hazards to health   . Prediabetes   . S/P CABG x 2 with clipping of LA appendage 11/14/2014   LIMA to LAD, SVG to OM, EVH via right thigh  . Urine incontinence   . Uterine fibroid     Patient Active Problem List   Diagnosis Date Noted  . Acute lower GI bleeding  02/10/2020  . Tobacco dependence 02/10/2020  . Obesity, Class II, BMI 35-39.9 02/10/2020  . Acute upper GI bleeding 10/26/2019  . COPD (chronic obstructive pulmonary disease) ([place]) 10/26/2019  . Acute GI bleeding 10/09/2019  . Acute blood loss anemia 10/09/2019  . Postmenopausal vaginal bleeding 01/04/2018  . Snoring 01/04/2018  . Acute on chronic combined systolic and diastolic CHF (congestive heart failure) ([place])   . CHF exacerbation ([place]) 10/16/2017  . Noncompliance 10/16/2017  . Acute on chronic congestive heart failure ([place])   . Thrombocytopenia ([place]) 09/14/2017  . Acute drug-induced gout of right foot   . Medication noncompliance due to cognitive impairment 09/06/2017  . Acute diastolic CHF (congestive heart failure) ([place]) 08/06/2017  . LGI bleed, likely hemorrhoids 08/06/2017  . Paroxysmal atrial fibrillation ([place]) 02/08/2017  . Cardiomyopathy, ischemic 02/08/2017  . Dysphagia 02/08/2017  . CKD (chronic kidney disease), stage III ([place]) 01/30/2017  . Pain in shoulder 02/08/2016  . Breast pain, left 02/08/2016  . Bilateral arm numbness and tingling while sleeping 02/08/2016  . Painful lumpy left breast 09/23/2015  . [place] intertrigo 02/11/2015  . S/P CABG x 2 11/14/2014  . Accelerated hypertension   . Cardiac arrest ([place]) 11/04/2014  . Left main coronary artery disease 11/04/2014  . Coronary artery disease due to lipid rich plaque   . Acute respiratory failure with hypoxemia ([place])   .  Essential hypertension   . Atrial fibrillation with RVR ([place])   . Hypokalemia 10/30/2014  . Chronic combined systolic and diastolic CHF (congestive heart failure) ([place]) 10/30/2014  . NSTEMI (non-ST elevated myocardial infarction) ([place])   . DOE (dyspnea on exertion)   . CAP (community acquired pneumonia) 10/29/2014    Past Surgical History:  Procedure Laterality Date  . BALLOON DILATION N/A 10/10/2019   Procedure: BALLOON DILATION;  Surgeon: Otis Brace, MD;  [facility]: [facility]  ENDOS[facility];  Service: Gastroenterology;  Laterality: N/A;  . BIOPSY  10/10/2019   Procedure: BIOPSY;  Surgeon: Otis Brace, MD;  Location: [place];  Service: Gastroenterology;;  . BIOPSY  10/11/2019   Procedure: BIOPSY;  Surgeon: Otis Brace, MD;  Location: [place];  Service: Gastroenterology;;  . CARDIOVERSION N/A 11/18/2014   Procedure: CARDIOVERSION;  Surgeon: [name], MD;  Location: [place];  Service: Cardiovascular;  Laterality: N/A;  . CARDIOVERSION N/A 12/25/2017   Procedure: CARDIOVERSION;  Surgeon: [name], MD;  Location: [place];  Service: Cardiovascular;  Laterality: N/A;  . CLIPPING OF ATRIAL APPENDAGE N/A 11/14/2014   Procedure: CLIPPING OF ATRIAL APPENDAGE;  Surgeon: [name], MD;  Location: [place];  Service: Open Heart Surgery;  Laterality: N/A;  . COLONOSCOPY  2013  . COLONOSCOPY WITH PROPOFOL N/A 10/11/2019   Procedure: COLONOSCOPY WITH PROPOFOL;  Surgeon: Otis Brace, MD;  Location: [place];  Service: Gastroenterology;  Laterality: N/A;  . CORONARY ARTERY BYPASS GRAFT N/A 11/14/2014   Procedure: CORONARY ARTERY BYPASS GRAFTING (CABG)TIMES 2 USING LEFT INTERNAL MAMMARY ARTERY AND RIGHT SAPHENOUS VEIN HARVESTED ENDOSCOPICALLY;  Surgeon: [name], MD;  Location: [place];  Service: Open Heart Surgery;  Laterality: N/A;  . ESOPHAGOGASTRODUODENOSCOPY (EGD) WITH PROPOFOL N/A 10/10/2019   Procedure: ESOPHAGOGASTRODUODENOSCOPY (EGD) WITH PROPOFOL;  Surgeon: Otis Brace, MD;  Location: [facility];  Service: Gastroenterology;  Laterality: N/A;  . ESOPHAGOGASTRODUODENOSCOPY (EGD) WITH PROPOFOL N/A 10/27/2019   Procedure: ESOPHAGOGASTRODUODENOSCOPY (EGD) WITH PROPOFOL;  Surgeon: Ronnette Juniper, MD;  Location: [place];  Service: Gastroenterology;  Laterality: N/A;  . GIVENS CAPSULE STUDY N/A 10/27/2019   Procedure: GIVENS CAPSULE STUDY;  Surgeon: Ronnette Juniper, MD;  Location: [place];  Service: Gastroenterology;  Laterality:  N/A;  . LEFT HEART CATHETERIZATION WITH CORONARY ANGIOGRAM N/A 11/04/2014   Procedure: LEFT HEART CATHETERIZATION WITH CORONARY ANGIOGRAM;  Surgeon: Troy Sine, MD;  Location: [facility] CATH LAB;  Service: Cardiovascular;  Laterality: N/A;  . POLYPECTOMY  10/11/2019   Procedure: POLYPECTOMY;  Surgeon: Otis Brace, MD;  Location: [place] ENDOSCOPY;  Service: Gastroenterology;;  . TEE WITHOUT CARDIOVERSION N/A 11/14/2014   Procedure: TRANSESOPHAGEAL ECHOCARDIOGRAM (TEE);  Surgeon: [name], MD;  Location: [place];  Service: Open Heart Surgery;  Laterality: N/A;  . TEE WITHOUT CARDIOVERSION N/A 12/25/2017   Procedure: TRANSESOPHAGEAL ECHOCARDIOGRAM (TEE);  Surgeon: [name], MD;  Location[facility] ENDOSCOPY;  Service: Cardiovascular;  Laterality: N/A;  . TEMPORARY PACEMAKER INSERTION  11/04/2014   Procedure: TEMPORARY PACEMAKER INSERTION;  Surgeon: Troy Sine, MD;  Location[facility] C[facility] LAB;  Service: Cardiovascular;;  . TOOTH EXTRACTION       OB History   No obstetric history on file.     Family History  Problem Relation Age of Onset  . Cancer Mother        LYMPHOMA  . Heart disease Father   . Other Father        TB  . CVA Sister   . Prostate cancer Brother 39  . Diabetes  Brother     Social History   Tobacco Use  . Smoking status: Current Some Day Smoker    Packs/day: 0.10    Years: 56.00    Pack years: 5.60    Types: Cigarettes  . Smokeless tobacco: Never Used  . Tobacco comment: "I might still take a puff now and then"  Vaping Use  . Vaping Use: Never used  Substance Use Topics  . Alcohol use: No    Alcohol/week: 0.0 standard drinks  . Drug use: No    Home Medications Prior to Admission medications   Medication Sig Start Date End Date Taking? Authorizing Provider  acetaminophen (TYLENOL) 500 MG tablet Take 500 mg by mouth every 6 (six) hours as needed for moderate pain.    Yes [provider]  allopurinol (ZYLOPRIM) 300 MG tablet Take 1 tablet (300 mg  total) by mouth daily. 01/04/18  Yes Georgiana Shore, NP  atorvastatin (LIPITOR) 10 MG tablet Take 5-10 mg by mouth at bedtime.    Yes [provider]  Coenzyme Q10 (CO Q-10 PO) Take 1 capsule by mouth daily.    Yes [provider]  furosemide (LASIX) 20 MG tablet Take 1 tablet (20 mg total) by mouth daily. 02/20/20  Yes Jerline Pain, MD  losartan (COZAAR) 25 MG tablet Take 12.5-25 mg by mouth See admin instructions. Take 25 mg by mouth in the morning and 12.5 mg at bedtime   Yes [provider]  metoprolol succinate (TOPROL-XL) 50 MG 24 hr tablet TAKE ONE TABLET BY MOUTH ONCE DAILY WITH OR immediately following A meal 12/12/19  Yes Bensimhon, Shaune Pascal, MD  Empagliflozin (JARDIANCE PO) Take 1 tablet by mouth.    [provider]  metFORMIN (GLUCOPHAGE) 500 MG tablet Take 500 mg by mouth daily.    [provider]  pantoprazole (PROTONIX) 40 MG tablet Take 40 mg by mouth as needed.    [provider]  rivaroxaban (XARELTO) 20 MG TABS tablet Take 1 tablet (20 mg total) by mouth daily with supper. Resume from 11/01/2019 11/01/19   Aline August, MD  senna-docusate (SENOKOT-S) 8.6-50 MG tablet Take 1 tablet by mouth 2 (two) times daily. 10/29/19   Aline August, MD  spironolactone (ALDACTONE) 25 MG tablet Take 1 tablet (25 mg total) by mouth daily. 12/02/19 06/26/20  Kathyrn Drown D, NP  triamcinolone ointment (KENALOG) 0.5 % Apply 1 application topically 2 (two) times daily as needed (itching, rash).  11/15/19   [provider]    Allergies    Bee venom, Ivp dye [iodinated diagnostic agents], Codeine, Entresto [sacubitril-valsartan], and Shrimp [shellfish allergy]  Review of Systems   Review of Systems  Constitutional: Negative for chills, diaphoresis and fever.  HENT: Negative for congestion and rhinorrhea.   Respiratory: Negative for cough and shortness of breath.   Cardiovascular: Positive for chest pain (rare) and palpitations. Negative  for near-syncope.  Gastrointestinal: Negative for diarrhea, nausea and vomiting.  Genitourinary: Negative for difficulty urinating and dysuria.  Musculoskeletal: Negative for arthralgias and back pain.  Skin: Negative for rash and wound.  Neurological: Negative for light-headedness and headaches.    Physical Exam Updated Vital Signs BP 123/90   Pulse (!) 147   Temp 98.3 F (36.8 C) (Oral)   Resp (!) 22   Ht 5' 7.5" (1.715 m)   Wt 95.3 kg   SpO2 92%   BMI 32.41 kg/m   Physical Exam Vitals and nursing [name] reviewed. Exam conducted with a chaperone present.  Constitutional:      General: She is not in acute distress.    Appearance: Normal appearance.  HENT:     Head: Normocephalic and atraumatic.     Nose: No rhinorrhea.  Eyes:     General:        Right eye: No discharge.        Left eye: No discharge.     Conjunctiva/sclera: Conjunctivae normal.  Cardiovascular:     Rate and Rhythm: Tachycardia present. Rhythm irregular.     Heart sounds: No murmur heard.   Pulmonary:     Effort: Pulmonary effort is normal. No respiratory distress.     Breath sounds: No stridor. No wheezing.  Abdominal:     General: Abdomen is flat. There is no distension.     Palpations: Abdomen is soft.  Musculoskeletal:        General: No tenderness or signs of injury.     Right lower leg: No edema.     Left lower leg: No edema.  Skin:    General: Skin is warm and dry.     Capillary Refill: Capillary refill takes less than 2 seconds.  Neurological:     General: No focal deficit present.     Mental Status: She is alert. Mental status is at baseline.     Motor: No weakness.  Psychiatric:        Mood and Affect: Mood normal.        Behavior: Behavior normal.     ED Results / Procedures / Treatments   Labs (all labs ordered are listed, but only abnormal results are displayed) Labs Reviewed  BASIC METABOLIC PANEL - Abnormal; Notable for the following components:      Result Value    Chloride 113 (*)    CO2 16 (*)    Glucose, Bld 125 (*)    Creatinine, Ser 1.31 (*)    GFR, Estimated 42 (*)    All other components within normal limits  CBC - Abnormal; Notable for the following components:   RBC 5.17 (*)    RDW 17.7 (*)    All other components within normal limits  PROTIME-INR - Abnormal; Notable for the following components:   Prothrombin Time 23.2 (*)    INR 2.1 (*)    All other components within normal limits  CBG MONITORING, ED - Abnormal; Notable for the following components:   Glucose-Capillary 113 (*)    All other components within normal limits  TROPONIN I (HIGH SENSITIVITY) - Abnormal; Notable for the following components:   Troponin I (High Sensitivity) 29 (*)    All other components within normal limits  BRAIN NATRIURETIC PEPTIDE  TROPONIN I (HIGH SENSITIVITY)    EKG EKG Interpretation  Date/Time:  Friday June 26 2020 14:04:59 EST Ventricular Rate:  175 PR Interval:    QRS Duration: 96 QT Interval:  278 QTC Calculation: 474 R Axis:   -37 Text Interpretation: Atrial fibrillation with rapid ventricular response with premature ventricular or aberrantly conducted complexes Left axis deviation Cannot rule out Anterior infarct , age undetermined ST & T wave abnormality, consider lateral ischemia Abnormal ECG Confirmed by Dewaine Conger [phone]) on 06/26/2020 2:52:21 PM   Radiology DG Chest Portable 1 View  Result Date: 06/26/2020 CLINICAL DATA:  Tachycardia, weakness for 1 week, wheezing EXAM: PORTABLE CHEST 1 VIEW COMPARISON:  05/04/2020 FINDINGS: Single frontal view of the chest demonstrates a stable cardiac silhouette. Postsurgical changes from median sternotomy. No airspace disease, effusion, or pneumothorax. No  acute bony abnormalities. IMPRESSION: 1. Stable exam, no acute process. Electronically Signed   By: Randa Ngo M.D.   On: 06/26/2020 15:13    Procedures .Critical Care Performed by: Breck Coons, MD Authorized by: Breck Coons, MD     Critical care provider statement:    Critical care time (minutes):  60   Critical care was necessary to treat or prevent imminent or life-threatening deterioration of the following conditions:  Cardiac failure   Critical care was time spent personally by me on the following activities:  Discussions with consultants, evaluation of patient's response to treatment, examination of patient, ordering and performing treatments and interventions, ordering and review of laboratory studies, ordering and review of radiographic studies, pulse oximetry, re-evaluation of patient's condition, obtaining history from patient or surrogate, review of old charts, blood draw for specimens and development of treatment plan with patient or surrogate   (including critical care time)  Medications Ordered in ED Medications  metoprolol succinate (TOPROL-XL) 24 hr tablet 50 mg (has no administration in time range)  metoprolol tartrate (LOPRESSOR) injection 5 mg (5 mg Intravenous Given 06/26/20 1446)  lactated ringers bolus 500 mL (0 mLs Intravenous Stopped 06/26/20 1552)  metoprolol tartrate (LOPRESSOR) injection 5 mg (5 mg Intravenous Given 06/26/20 1507)  metoprolol tartrate (LOPRESSOR) injection 5 mg (5 mg Intravenous Given 06/26/20 1536)    ED Course  I have reviewed the triage vital signs and the nursing notes.  Pertinent labs & imaging results that were available during my care of the patient were reviewed by me and considered in my medical decision making (see chart for details).    MDM Rules/Calculators/A&P                          Intermittent palpitations and weakness over the last week.  It can change her hyperglycemia medications but not her rate control medications she has been compliant.  No recent trauma no recent illness hydrating and eating well.  Normal bowel function minimal chest discomfort.  Initial EKG shows atrial fibrillation without significant ischemic changes, rapid ventricular response,   Chest x-ray reviewed by radiology myself shows after my review and compared to previous shows stable chronic changes no acute abnormalities.  Glucose is mildly elevated still waiting for the labs, IV fluid bolus given first dose of metoprolol given second dose of metoprolol given minimal response will try additional as needed.    Troponin mildly elevated secondary to her rate at this time.  Still not obtaining adequate rate control with 3 doses of Lopressor, consulted cardiology, they recommended looking for another source of the cause of her atrial fibrillation and getting a BNP, on exam she does not appear fluid overloaded to me.  She has no pitting edema, she has no rales in her lungs.  Recommended giving a dose of long-acting Lopressor observing her for further waiting for labs.  CRITICAL CARE Performed by: Breck Coons   Total critical care time: 60 minutes  Critical care time was exclusive of separately billable procedures and treating other patients.  Critical care was necessary to treat or prevent imminent or life-threatening deterioration.  Critical care was time spent personally by me on the following activities: development of treatment plan with patient and/or surrogate as well as nursing, discussions with consultants, evaluation of patient's response to treatment, examination of patient, obtaining history from patient or surrogate, ordering and performing treatments and interventions, ordering and review of laboratory studies, ordering  and review of radiographic studies, pulse oximetry and re-evaluation of patient's condition.    Final Clinical Impression(s) / ED Diagnoses Final diagnoses:  Atrial fibrillation with RVR [facility])    Rx / DC Orders ED Discharge Orders    None       Breck Coons, MD 06/26/20 [phone]

## 2020-06-26 NOTE — H&P (Addendum)
History and Physical    [name] [name] EHM:[id number] DOB: 13-Jun-1946 DOA: 06/26/2020  PCP: [name], MD Patient coming from: Home, patient drove herself to the ED  Chief Complaint: Dyspnea and palpitations  HPI: [name] [name] is a 74 y.o. female with medical history significant of asthma, CAD, ischemic cardiomyopathy status post CABG, CKD stage III, prediabetes, atrial fibrillation on Xarelto, lower GI bleeding secondary to hemorrhoids. Patient presented secondary to 1 week of persistent/worsening dyspnea with exertion and palpitations. She reports not taking anything to help with her symptoms. She states she has been adherent with her atrial fibrillation medications. Patient reports recently seeing her cardiologist's PA on 11/2.   ED Course: Vitals: Temperature 98.3 F, pulse of 119 bpm, respirations of 28, blood pressure of 143/102 Labs: Chloride of 113, CO2 of 16, creatinine 1.31, INR of 2.1 Imaging: Chest x-ray significant for no acute process Medications/Course: Metoprolol 5 mg IV x3, metoprolol succinate 50 mg p.o. x 1, diltiazem drip, Lasix 40 mg IV, LR bolus of 500 mL  Review of Systems: Review of Systems  Constitutional: Negative for chills and fever.  Respiratory: Positive for shortness of breath. Negative for cough and sputum production.   Cardiovascular: Positive for palpitations. Negative for chest pain, orthopnea, leg swelling and PND.  Gastrointestinal: Negative for abdominal pain, constipation, diarrhea, nausea and vomiting.  Neurological: Negative for weakness.  Psychiatric/Behavioral: Positive for depression.    Past Medical History:  Diagnosis Date  . Arthritis   . Asthma    ??  . Atopic dermatitis   . CAD (coronary artery disease)    a. 2016 s/p CABG x 2 (LIMA->LAD, VG->OM); b. 08/2018 MV: EF 44%, no ischemia/infact.  . Cardiac arrest ([place]) 10/2014  . Cardiomyopathy, ischemic    a. EF previously low, improved to LVEF 50-55% as of May 2018  . Chronic  combined systolic and diastolic heart failure ([place])   . CKD (chronic kidney disease), stage III ([place])   . COPD (chronic obstructive pulmonary disease) ([place])   . Esophageal dilatation 2013  . GERD (gastroesophageal reflux disease)   . Gout   . Headache   . Hypertension   . Hypokalemia   . Idiopathic angioedema   . LGI bleed 08/06/2017   a. felt to be hemorrhoidal during that admission (no drop in Hgb).  . Lower back pain   . Persistent atrial fibrillation ([place])    a. Dx 2016-->h/o difficult to control rates (complicated by noncompliance), not felt to be a candidate for ablation or antiarrhythmic due to noncompliance; b. Recurrent AF 2021 - converted w/ IV dilt; c. CHA2DS2VASc = 6-->Xarelto.  . Personal history of noncompliance with medical treatment, presenting hazards to health   . Prediabetes   . S/P CABG x 2 with clipping of LA appendage 11/14/2014   LIMA to LAD, SVG to OM, EVH via right thigh  . Urine incontinence   . Uterine fibroid     Past Surgical History:  Procedure Laterality Date  . BALLOON DILATION N/A 10/10/2019   Procedure: BALLOON DILATION;  Surgeon: Otis Brace, MD;  Location: [facility];  Service: Gastroenterology;  Laterality: N/A;  . BIOPSY  10/10/2019   Procedure: BIOPSY;  Surgeon: Otis Brace, MD;  Location: [place];  Service: Gastroenterology;;  . BIOPSY  10/11/2019   Procedure: BIOPSY;  Surgeon: Otis Brace, MD;  Location: [place];  Service: Gastroenterology;;  . CARDIOVERSION N/A 11/18/2014   Procedure: CARDIOVERSION;  Surgeon: [name], MD;  Location: [place];  Service: Cardiovascular;  Laterality: N/A;  . CARDIOVERSION N/A 12/25/2017   Procedure: CARDIOVERSION;  Surgeon: [name], MD;  Location: [facility] ENDOSCOPY;  Service: Cardiovascular;  Laterality: N/A;  . CLIPPING OF ATRIAL APPENDAGE N/A 11/14/2014   Procedure: CLIPPING OF ATRIAL APPENDAGE;  Surgeon: [name], MD;  Location: [place];  Service: Open Heart Surgery;   Laterality: N/A;  . COLONOSCOPY  2013  . COLONOSCOPY WITH PROPOFOL N/A 10/11/2019   Procedure: COLONOSCOPY WITH PROPOFOL;  Surgeon: Otis Brace, MD;  Location: [place];  Service: Gastroenterology;  Laterality: N/A;  . CORONARY ARTERY BYPASS GRAFT N/A 11/14/2014   Procedure: CORONARY ARTERY BYPASS GRAFTING (CABG)TIMES 2 USING LEFT INTERNAL MAMMARY ARTERY AND RIGHT SAPHENOUS VEIN HARVESTED ENDOSCOPICALLY;  Surgeon: [name], MD;  Location: [place];  Service: Open Heart Surgery;  Laterality: N/A;  . ESOPHAGOGASTRODUODENOSCOPY (EGD) WITH PROPOFOL N/A 10/10/2019   Procedure: ESOPHAGOGASTRODUODENOSCOPY (EGD) WITH PROPOFOL;  Surgeon: Otis Brace, MD;  Location: [facility];  Service: Gastroenterology;  Laterality: N/A;  . ESOPHAGOGASTRODUODENOSCOPY (EGD) WITH PROPOFOL N/A 10/27/2019   Procedure: ESOPHAGOGASTRODUODENOSCOPY (EGD) WITH PROPOFOL;  Surgeon: Ronnette Juniper, MD;  Location: [place];  Service: Gastroenterology;  Laterality: N/A;  . GIVENS CAPSULE STUDY N/A 10/27/2019   Procedure: GIVENS CAPSULE STUDY;  Surgeon: Ronnette Juniper, MD;  Location: [place];  Service: Gastroenterology;  Laterality: N/A;  . LEFT HEART CATHETERIZATION WITH CORONARY ANGIOGRAM N/A 11/04/2014   Procedure: LEFT HEART CATHETERIZATION WITH CORONARY ANGIOGRAM;  Surgeon: Troy Sine, MD;  Location: [facility] CATH LAB;  Service: Cardiovascular;  Laterality: N/A;  . POLYPECTOMY  10/11/2019   Procedure: POLYPECTOMY;  Surgeon: Otis Brace, MD;  Location: [place] ENDOSCOPY;  Service: Gastroenterology;;  . TEE WITHOUT CARDIOVERSION N/A 11/14/2014   Procedure: TRANSESOPHAGEAL ECHOCARDIOGRAM (TEE);  Surgeon: [name], MD;  Location: [place];  Service: Open Heart Surgery;  Laterality: N/A;  . TEE WITHOUT CARDIOVERSION N/A 12/25/2017   Procedure: TRANSESOPHAGEAL ECHOCARDIOGRAM (TEE);  Surgeon: [name], MD;  Location: [facility] ENDOSCOPY;  Service: Cardiovascular;  Laterality: N/A;  . TEMPORARY PACEMAKER INSERTION   11/04/2014   Procedure: TEMPORARY PACEMAKER INSERTION;  Surgeon: Troy Sine, MD;  Location: [facility] CATH LAB;  Service: Cardiovascular;;  . TOOTH EXTRACTION       reports that she has been smoking cigarettes. She has a 5.60 pack-year smoking history. She has never used smokeless tobacco. She reports that she does not drink alcohol and does not use drugs.  Allergies  Allergen Reactions  . Bee Venom Anaphylaxis  . Ivp Dye [Iodinated Diagnostic Agents] Anaphylaxis  . Codeine Nausea And Vomiting  . Entresto [Sacubitril-Valsartan]     Chest pain   . Shrimp [Shellfish Allergy] Swelling    Family History  Problem Relation Age of Onset  . Cancer Mother        LYMPHOMA  . Heart disease Father   . Other Father        TB  . CVA Sister   . Prostate cancer Brother 25  . Diabetes Brother    Prior to Admission medications   Medication Sig Start Date End Date Taking? Authorizing Provider  acetaminophen (TYLENOL) 500 MG tablet Take 500 mg by mouth every 6 (six) hours as needed for moderate pain.    Yes [provider]  allopurinol (ZYLOPRIM) 300 MG tablet Take 1 tablet (300 mg total) by mouth daily. 01/04/18  Yes Georgiana Shore, NP  atorvastatin (LIPITOR) 10 MG tablet Take 5-10 mg by mouth at bedtime.    Yes [provider]  Coenzyme Q10 (CO Q-10 PO) Take 1 capsule by mouth daily.    Yes [provider]  furosemide (LASIX) 20 MG tablet Take 1 tablet (20 mg total) by mouth daily. 02/20/20  Yes Jerline Pain, MD  losartan (COZAAR) 25 MG tablet Take 12.5-25 mg by mouth See admin instructions. Take 25 mg by mouth in the morning and 12.5 mg at bedtime   Yes [provider]  metoprolol succinate (TOPROL-XL) 50 MG 24 hr tablet TAKE ONE TABLET BY MOUTH ONCE DAILY WITH OR immediately following A meal 12/12/19  Yes Bensimhon, Shaune Pascal, MD  Empagliflozin (JARDIANCE PO) Take 1 tablet by mouth.    [provider]  metFORMIN (GLUCOPHAGE) 500 MG tablet Take 500 mg by  mouth daily.    [provider]  pantoprazole (PROTONIX) 40 MG tablet Take 40 mg by mouth as needed.    [provider]  rivaroxaban (XARELTO) 20 MG TABS tablet Take 1 tablet (20 mg total) by mouth daily with supper. Resume from 11/01/2019 11/01/19   Aline August, MD  senna-docusate (SENOKOT-S) 8.6-50 MG tablet Take 1 tablet by mouth 2 (two) times daily. 10/29/19   Aline August, MD  spironolactone (ALDACTONE) 25 MG tablet Take 1 tablet (25 mg total) by mouth daily. 12/02/19 06/26/20  Kathyrn Drown D, NP  triamcinolone ointment (KENALOG) 0.5 % Apply 1 application topically 2 (two) times daily as needed (itching, rash).  11/15/19   [provider]    Physical Exam:  Physical Exam Constitutional:      General: She is not in acute distress.    Appearance: She is well-developed. She is not diaphoretic.  HENT:     Mouth/Throat:     Mouth: Mucous membranes are moist.  Eyes:     Conjunctiva/sclera: Conjunctivae normal.     Pupils: Pupils are equal, round, and reactive to light.  Cardiovascular:     Rate and Rhythm: Tachycardia present. Rhythm irregular.     Heart sounds: Normal heart sounds. No murmur heard.   Pulmonary:     Effort: Pulmonary effort is normal. No respiratory distress.     Breath sounds: Decreased breath sounds present. No wheezing, rhonchi or rales.  Abdominal:     General: Bowel sounds are normal. There is no distension.     Palpations: Abdomen is soft.     Tenderness: There is no abdominal tenderness. There is no guarding or rebound.  Musculoskeletal:        General: No tenderness. Normal range of motion.     Cervical back: Normal range of motion.     Right lower leg: No edema.     Left lower leg: No edema.  Lymphadenopathy:     Cervical: No cervical adenopathy.  Skin:    General: Skin is warm and dry.  Neurological:     Mental Status: She is alert and oriented to person, place, and time.     Comments: Oriented to person, place, time and  situation.      Labs on Admission: I have personally reviewed following labs and imaging studies  CBC: Recent Labs  Lab 06/26/20 1423  WBC 8.3  HGB 14.2  HCT 44.7  MCV 86.5  PLT 921    Basic Metabolic Panel: Recent Labs  Lab 06/26/20 1423  NA 141  K 3.6  CL 113*  CO2 16*  GLUCOSE 125*  BUN 23  CREATININE 1.31*  CALCIUM 9.2    GFR: Estimated Creatinine Clearance: 44.4 mL/min (A) (by C-G formula based  on SCr of 1.31 mg/dL (H)).  Liver Function Tests: No results for input(s): AST, ALT, ALKPHOS, BILITOT, PROT, ALBUMIN in the last 168 hours. No results for input(s): LIPASE, AMYLASE in the last 168 hours. No results for input(s): AMMONIA in the last 168 hours.  Coagulation Profile: Recent Labs  Lab 06/26/20 1423  INR 2.1*    Cardiac Enzymes: No results for input(s): CKTOTAL, CKMB, CKMBINDEX, TROPONINI in the last 168 hours.  BNP (last 3 results) No results for input(s): PROBNP in the last 8760 hours.  HbA1C: No results for input(s): HGBA1C in the last 72 hours.  CBG: Recent Labs  Lab 06/26/20 1433  GLUCAP 113*    Urine analysis:    Component Value Date/Time   COLORURINE AMBER (A) 10/16/2017 0353   APPEARANCEUR CLOUDY (A) 10/16/2017 0353   LABSPEC 1.017 10/16/2017 0353   PHURINE 7.0 10/16/2017 0353   GLUCOSEU NEGATIVE 10/16/2017 0353   HGBUR NEGATIVE 10/16/2017 0353   BILIRUBINUR NEGATIVE 10/16/2017 0353   BILIRUBINUR ng 12/25/2013 1342   KETONESUR NEGATIVE 10/16/2017 0353   PROTEINUR 30 (A) 10/16/2017 0353   UROBILINOGEN 0.2 04/11/2017 1402   NITRITE NEGATIVE 10/16/2017 0353   LEUKOCYTESUR SMALL (A) 10/16/2017 0353     Radiological Exams on Admission: DG Chest Portable 1 View  Result Date: 06/26/2020 CLINICAL DATA:  Tachycardia, weakness for 1 week, wheezing EXAM: PORTABLE CHEST 1 VIEW COMPARISON:  05/04/2020 FINDINGS: Single frontal view of the chest demonstrates a stable cardiac silhouette. Postsurgical changes from median sternotomy.  No airspace disease, effusion, or pneumothorax. No acute bony abnormalities. IMPRESSION: 1. Stable exam, no acute process. Electronically Signed   By: Randa Ngo M.D.   On: 06/26/2020 15:13    EKG: Independently reviewed. Atrial fibrillation with rapid rate  Assessment/Plan Principal Problem:   Atrial fibrillation with RVR (HCC) Active Problems:   Chronic combined systolic and diastolic CHF (congestive heart failure) (HCC)   Essential hypertension   CKD (chronic kidney disease), stage III (HCC)   Obesity, Class II, BMI 35-39.9   Atrial fibrillation with RVR Patient is on metoprolol and Xarelto as an outpatient. She reports taking her medication. Started on a diltiazem drip and home metoprolol by EDP per cardiology recommendations -Per cardiology -Xarelto (dose adjusted for kidney function)  Chronic combined systolic/diastolic heart failure Ischemic cardiomyopathy S/P CABG Last Transthoracic Echocardiogram with improved function. BNP is elevated. Denies orthopnea or PND. Possible acute exacerbation with tachypnea, although x-ray is clear. Given Lasix 40 mg IV x1 in the ED -Continue home losartan, metoprolol, spironolactone -Per cardiology  CKD Stage IIIa Non-anion gap mtabolic acidosis Acidosis in setting of chronic kidney disease.  -Start sodium bicarbonate 650 mg BID -Outpatient nephrology referral  Essential hypertension Continue home losartan, metoprolol, spironolactone  Pre-diabetes No history of diabetes. Last hemoglobin A1C per chart review is 6.4%. Patient is on metformin and Jardiance as an outpatient -SSI  Depressed mood Patient reports losing 4 close people to death in the last 5 months which has caused her to have depressed mood. She has started smoking again because of these recent losses. She currently does not attend therapy and/or sought pharmacologic therapy -Recommend patient to see a therapist and/or see her PCP to initiate medical management for  probable depression; Wellbutrin may be a good initial drug in relation to patient's tobacco use  Tobacco use Previously ceased but now intermittent secondary to recent personal loss. Cessation discussed  Hyperlipidemia -Continue Lipitor  History of LGI bleed At the time, deemed secondary to hemorrhoids. Xarelto had  been stopped but restarted. Hemoglobin is normal and stable.  Elevated INR In setting of Xarelto use. No prior history of LFT abnormalities. No history of liver disease.  Obesity Body mass index is 32.41 kg/m.   DVT prophylaxis: Xarelto Code Status: Full code Family Communication: None at bedside Disposition Plan: Discharge home pending cardiology recommendatinos Consults called: Cardiology by EDP Admission status: Observation   Cordelia Poche, MD [facility] 06/26/2020, 6:23 PM

## 2020-06-26 NOTE — ED Provider Notes (Signed)
  Physical Exam  BP 121/74   Pulse (!) 29   Temp 98.3 F (36.8 C) (Oral)   Resp (!) 29   Ht 5' 7.5" (1.715 m)   Wt 95.3 kg   SpO2 96%   BMI 32.41 kg/m   Physical Exam  ED Course/Procedures     Procedures  MDM  Performed at 4 PM.  Patient is here for palpitations and weakness.  Patient states that he had been going on for about a week or so.  Patient is rapid A. fib and required multiple doses of Lopressor.  Cardiology was consulted and was recommended to get another dose of oral Lopressor.  Her heart rate is not controlled, patient will need admission  5 pm Patient's heart rate is still in the 130s.  I talked to Dr. Davina Poke from cardiology.  He will see the patient recommend Cardizem IV bolus and drip.  5:36 PM Patient's BNP is elevated at 900.  Given IV Lasix.  Hospitalist to admit.      Drenda Freeze, MD 06/26/20 [phone]

## 2020-06-27 ENCOUNTER — Observation Stay (HOSPITAL_COMMUNITY): Payer: HMO

## 2020-06-27 DIAGNOSIS — K219 Gastro-esophageal reflux disease without esophagitis: Secondary | ICD-10-CM | POA: Diagnosis present

## 2020-06-27 DIAGNOSIS — I480 Paroxysmal atrial fibrillation: Secondary | ICD-10-CM | POA: Diagnosis not present

## 2020-06-27 DIAGNOSIS — Z951 Presence of aortocoronary bypass graft: Secondary | ICD-10-CM | POA: Diagnosis not present

## 2020-06-27 DIAGNOSIS — J449 Chronic obstructive pulmonary disease, unspecified: Secondary | ICD-10-CM | POA: Diagnosis present

## 2020-06-27 DIAGNOSIS — I4819 Other persistent atrial fibrillation: Secondary | ICD-10-CM | POA: Diagnosis present

## 2020-06-27 DIAGNOSIS — Z79899 Other long term (current) drug therapy: Secondary | ICD-10-CM | POA: Diagnosis not present

## 2020-06-27 DIAGNOSIS — Z91030 Bee allergy status: Secondary | ICD-10-CM | POA: Diagnosis not present

## 2020-06-27 DIAGNOSIS — E878 Other disorders of electrolyte and fluid balance, not elsewhere classified: Secondary | ICD-10-CM | POA: Diagnosis present

## 2020-06-27 DIAGNOSIS — N1831 Chronic kidney disease, stage 3a: Secondary | ICD-10-CM

## 2020-06-27 DIAGNOSIS — M1990 Unspecified osteoarthritis, unspecified site: Secondary | ICD-10-CM | POA: Diagnosis present

## 2020-06-27 DIAGNOSIS — F17210 Nicotine dependence, cigarettes, uncomplicated: Secondary | ICD-10-CM | POA: Diagnosis present

## 2020-06-27 DIAGNOSIS — I2583 Coronary atherosclerosis due to lipid rich plaque: Secondary | ICD-10-CM | POA: Diagnosis present

## 2020-06-27 DIAGNOSIS — Z7984 Long term (current) use of oral hypoglycemic drugs: Secondary | ICD-10-CM | POA: Diagnosis not present

## 2020-06-27 DIAGNOSIS — E1122 Type 2 diabetes mellitus with diabetic chronic kidney disease: Secondary | ICD-10-CM | POA: Diagnosis present

## 2020-06-27 DIAGNOSIS — I5033 Acute on chronic diastolic (congestive) heart failure: Secondary | ICD-10-CM | POA: Diagnosis not present

## 2020-06-27 DIAGNOSIS — E872 Acidosis: Secondary | ICD-10-CM | POA: Diagnosis present

## 2020-06-27 DIAGNOSIS — I361 Nonrheumatic tricuspid (valve) insufficiency: Secondary | ICD-10-CM | POA: Diagnosis not present

## 2020-06-27 DIAGNOSIS — I340 Nonrheumatic mitral (valve) insufficiency: Secondary | ICD-10-CM

## 2020-06-27 DIAGNOSIS — I4891 Unspecified atrial fibrillation: Secondary | ICD-10-CM

## 2020-06-27 DIAGNOSIS — Z8674 Personal history of sudden cardiac arrest: Secondary | ICD-10-CM | POA: Diagnosis not present

## 2020-06-27 DIAGNOSIS — Z7901 Long term (current) use of anticoagulants: Secondary | ICD-10-CM | POA: Diagnosis not present

## 2020-06-27 DIAGNOSIS — E785 Hyperlipidemia, unspecified: Secondary | ICD-10-CM | POA: Diagnosis present

## 2020-06-27 DIAGNOSIS — I10 Essential (primary) hypertension: Secondary | ICD-10-CM

## 2020-06-27 DIAGNOSIS — I5042 Chronic combined systolic (congestive) and diastolic (congestive) heart failure: Secondary | ICD-10-CM | POA: Diagnosis present

## 2020-06-27 DIAGNOSIS — I2510 Atherosclerotic heart disease of native coronary artery without angina pectoris: Secondary | ICD-10-CM | POA: Diagnosis present

## 2020-06-27 DIAGNOSIS — M109 Gout, unspecified: Secondary | ICD-10-CM | POA: Diagnosis present

## 2020-06-27 DIAGNOSIS — E669 Obesity, unspecified: Secondary | ICD-10-CM | POA: Diagnosis present

## 2020-06-27 DIAGNOSIS — I130 Hypertensive heart and chronic kidney disease with heart failure and stage 1 through stage 4 chronic kidney disease, or unspecified chronic kidney disease: Secondary | ICD-10-CM | POA: Diagnosis present

## 2020-06-27 DIAGNOSIS — Z20822 Contact with and (suspected) exposure to covid-19: Secondary | ICD-10-CM | POA: Diagnosis present

## 2020-06-27 DIAGNOSIS — I255 Ischemic cardiomyopathy: Secondary | ICD-10-CM | POA: Diagnosis present

## 2020-06-27 LAB — ECHOCARDIOGRAM COMPLETE
AR max vel: 1.18 cm2
AV Area VTI: 0.92 cm2
AV Area mean vel: 1.03 cm2
AV Mean grad: 7 mmHg
AV Peak grad: 15.1 mmHg
Ao pk vel: 1.94 m/s
Area-P 1/2: 4.13 cm2
Height: 67.5 in
P 1/2 time: 330 msec
S' Lateral: 3.52 cm
Weight: 3360 oz

## 2020-06-27 LAB — BASIC METABOLIC PANEL WITH GFR
Anion gap: 9 (ref 5–15)
BUN: 23 mg/dL (ref 8–23)
CO2: 18 mmol/L — ABNORMAL LOW (ref 22–32)
Calcium: 8.8 mg/dL — ABNORMAL LOW (ref 8.9–10.3)
Chloride: 112 mmol/L — ABNORMAL HIGH (ref 98–111)
Creatinine, Ser: 1.24 mg/dL — ABNORMAL HIGH (ref 0.44–1.00)
GFR, Estimated: 45 mL/min — ABNORMAL LOW
Glucose, Bld: 144 mg/dL — ABNORMAL HIGH (ref 70–99)
Potassium: 3.4 mmol/L — ABNORMAL LOW (ref 3.5–5.1)
Sodium: 139 mmol/L (ref 135–145)

## 2020-06-27 LAB — GLUCOSE, CAPILLARY
Glucose-Capillary: 145 mg/dL — ABNORMAL HIGH (ref 70–99)
Glucose-Capillary: 95 mg/dL (ref 70–99)
Glucose-Capillary: 121 mg/dL — ABNORMAL HIGH (ref 70–99)
Glucose-Capillary: 127 mg/dL — ABNORMAL HIGH (ref 70–99)

## 2020-06-27 LAB — HEMOGLOBIN A1C
Hgb A1c MFr Bld: 6.2 % — ABNORMAL HIGH (ref 4.8–5.6)
Mean Plasma Glucose: 131.24 mg/dL

## 2020-06-27 LAB — CBC
HCT: 43.4 % (ref 36.0–46.0)
Hemoglobin: 13.8 g/dL (ref 12.0–15.0)
MCH: 27.7 pg (ref 26.0–34.0)
MCHC: 31.8 g/dL (ref 30.0–36.0)
MCV: 87 fL (ref 80.0–100.0)
Platelets: 147 K/uL — ABNORMAL LOW (ref 150–400)
RBC: 4.99 MIL/uL (ref 3.87–5.11)
RDW: 17.6 % — ABNORMAL HIGH (ref 11.5–15.5)
WBC: 7.9 K/uL (ref 4.0–10.5)
nRBC: 0 % (ref 0.0–0.2)

## 2020-06-27 LAB — PROTIME-INR
INR: 3.3 — ABNORMAL HIGH (ref 0.8–1.2)
Prothrombin Time: 32.5 seconds — ABNORMAL HIGH (ref 11.4–15.2)

## 2020-06-27 MED ORDER — POTASSIUM CHLORIDE CRYS ER 20 MEQ PO TBCR
40.0000 meq | EXTENDED_RELEASE_TABLET | Freq: Once | ORAL | Status: AC
  Administered 2020-06-27: 40 meq via ORAL
  Filled 2020-06-27: qty 2

## 2020-06-27 MED ORDER — ATORVASTATIN CALCIUM 10 MG PO TABS
10.0000 mg | ORAL_TABLET | Freq: Every day | ORAL | Status: DC
  Administered 2020-06-27 – 2020-06-29 (×3): 10 mg via ORAL
  Filled 2020-06-27 (×3): qty 1

## 2020-06-27 MED ORDER — DILTIAZEM LOAD VIA INFUSION
10.0000 mg | Freq: Once | INTRAVENOUS | Status: AC
  Administered 2020-06-27: 10 mg via INTRAVENOUS
  Filled 2020-06-27: qty 10

## 2020-06-27 NOTE — Progress Notes (Signed)
Left hand iv infiltrated(at approx 1545), IV Cardizem infusing, IV stopped and removed.  Heat applied (appropriate treatment per Mimi, PharmD)

## 2020-06-27 NOTE — Progress Notes (Addendum)
   06/26/20 2300  Assess: MEWS Score  Temp 98.3 F (36.8 C)  BP 101/71  Pulse Rate (!) 101  Resp 20  SpO2 98 %  O2 Device Room Air  Patient Activity (if Appropriate) In bed  Assess: MEWS Score  MEWS Temp 0  MEWS Systolic 0  MEWS Pulse 1  MEWS RR 0  MEWS LOC 0  MEWS Score 1  MEWS Score Color Green  Assess: if the MEWS score is Yellow or Red  Were vital signs taken at a resting state? Yes  Focused Assessment No change from prior assessment  Early Detection of Sepsis Score *See Row Information* Low  MEWS guidelines implemented *See Row Information* Yes

## 2020-06-27 NOTE — Progress Notes (Signed)
Progress Note  Patient Name: [name] [name] Date of Encounter: 06/27/2020  [facility] Cardiologist: [name], MD   Subjective   Denies any chest pain.  SOB improved.  She put out 2.7L and is net neg 1.9L.    Inpatient Medications    Scheduled Meds: . furosemide  60 mg Intravenous BID  . insulin aspart  0-15 Units Subcutaneous TID WC  . losartan  25 mg Oral Daily   And  . losartan  12.5 mg Oral QHS  . metoprolol succinate  50 mg Oral Daily  . rivaroxaban  20 mg Oral Q supper  . sodium bicarbonate  650 mg Oral BID  . spironolactone  25 mg Oral Daily   Continuous Infusions: . diltiazem (CARDIZEM) infusion 7.5 mg/hr (06/27/20 0609)   PRN Meds: acetaminophen, ondansetron (ZOFRAN) IV   Vital Signs    Vitals:   06/26/20 2000 06/26/20 2300 06/27/20 0100 06/27/20 0352  BP: 126/90 101/71 99/65 114/78  Pulse: (!) 106 (!) 101 100 (!) 108  Resp: 20 20 20 18   Temp: 98.3 F (36.8 C) 98.3 F (36.8 C) 98.3 F (36.8 C) 98.2 F (36.8 C)  TempSrc: Oral Oral Oral Oral  SpO2: 98% 98% 97% 96%  Weight:      Height:        Intake/Output Summary (Last 24 hours) at 06/27/2020 0733 Last data filed at 06/27/2020 0553 Gross per 24 hour  Intake 857.5 ml  Output 2750 ml  Net -1892.5 ml   Last 3 Weights 06/26/2020 06/16/2020 03/09/2020  Weight (lbs) 210 lb 210 lb 215 lb  Weight (kg) 95.255 kg 95.255 kg 97.523 kg      Telemetry    Atrial fibrillation with RVR  - Personally Reviewed  ECG    No new EKG to review - Personally Reviewed  Physical Exam   GEN: No acute distress.   Neck: No JVD Cardiac: irregularly irregular and tachy, no murmurs, rubs, or gallops.  Respiratory: decreased air movement throughout GI: Soft, nontender, non-distended  MS: No edema; No deformity. Neuro:  Nonfocal  Psych: Normal affect   Labs    High Sensitivity Troponin:   Recent Labs  Lab 06/26/20 1423 06/26/20 1608  TROPONINIHS 29* 33*      Chemistry Recent Labs  Lab  06/26/20 1423 06/27/20 0209  NA 141 139  K 3.6 3.4*  CL 113* 112*  CO2 16* 18*  GLUCOSE 125* 144*  BUN 23 23  CREATININE 1.31* 1.24*  CALCIUM 9.2 8.8*  GFRNONAA 42* 45*  ANIONGAP 12 9     Hematology Recent Labs  Lab 06/26/20 1423 06/27/20 0209  WBC 8.3 7.9  RBC 5.17* 4.99  HGB 14.2 13.8  HCT 44.7 43.4  MCV 86.5 87.0  MCH 27.5 27.7  MCHC 31.8 31.8  RDW 17.7* 17.6*  PLT 158 147*    BNP Recent Labs  Lab 06/26/20 1439  BNP 927.9*     DDimer No results for input(s): DDIMER in the last 168 hours.    Radiology    DG Chest Portable 1 View  Result Date: 06/26/2020 CLINICAL DATA:  Tachycardia, weakness for 1 week, wheezing EXAM: PORTABLE CHEST 1 VIEW COMPARISON:  05/04/2020 FINDINGS: Single frontal view of the chest demonstrates a stable cardiac silhouette. Postsurgical changes from median sternotomy. No airspace disease, effusion, or pneumothorax. No acute bony abnormalities. IMPRESSION: 1. Stable exam, no acute process. Electronically Signed   By: Randa Ngo M.D.   On: 06/26/2020 15:13    Cardiac  Studies   none  Patient Profile     74 y.o. female with a history of CAD s/p CABG, HFpEF (EF 55-60% 11/2019), ICM, HTN, HL, CKD III, COPD, ongoing tob abuse, PAF on xarelto, prior GIB, and type II DM, who is being seen for the evaluation of Afib RVR and volume overlolad at the request of Dr. Lonny Prude.  Assessment & Plan    1. Atrial fibrillation with RVR -Noncompliance documented in the chart.  She reports she likely has missed doses of her medication.  She has been deemed not a great candidate for rhythm control strategy or ablation due to noncompliance. -Unclear if HFpEF put her into A. fib with RVR or A. fib with RVR put her into HFpEF.  Also noticeably diminished airway movement.  Question COPD. -HR has improved on diltiazem drip but still elevated.   -will bolus with 10mg  IV Cardizem and increase gtt to 10mg /hr -Continue Toprol XL  -She reports she did miss her  Xarelto.   -Given history of noncompliance and GI bleed, would recommend rate control strategy moving forward.   2.  Shortness of breath/elevated BNP/HFpEF/COPD? -Elevated BNP.  Elevated JVD on admit.   -started on  Lasix IV 60 mg twice daily. -she put out 2.7L and is net neg 1.9L.   -She is noticeably not moving much air.  She does smoke and has a history of this.  Question COPD.   -Her chest x-ray is inconsistent with pleural effusions and I do not think her entire picture is explained by HFpEF.   -Pulmonary function testing in 2016 with moderate airway obstruction as well. -Repeat echocardiogram pending -replete K+ t keep >  4.  -SCr stable at 1.24 -continue IV lasix until tomorrow and reassess in am  3.  Hyperchloremic non-anion gap metabolic acidosis -She had a metabolic acidosis for several months.   -per TR[facility]  4.  CAD status post CABG -Negative Myoview in 2020.   -Troponin is minimally elevated and flat c/w demand ischemia.  No concerns for ACS. -continue BB -no ASA due to DOAC    I have spent a total of 35 minutes with patient reviewing hospital notes , telemetry, EKGs, labs and examining patient as well as establishing an assessment and plan that was discussed with the patient.  > 50% of time was spent in direct patient care.    For questions or updates, please contact [place] Please consult [email or web address] for contact info under        Signed, Fransico Him, MD  06/27/2020, 7:33 AM

## 2020-06-27 NOTE — Plan of Care (Signed)

## 2020-06-27 NOTE — Progress Notes (Signed)
  Echocardiogram 2D Echocardiogram has been performed.  [name] [name] 06/27/2020, 8:47 AM

## 2020-06-27 NOTE — Progress Notes (Addendum)
PROGRESS NOTE    [name] [name]  IRS:[id number] DOB: October 20, 1945 DOA: 06/26/2020 PCP: [name], MD   Brief Narrative: [name] [name] is a 74 y.o. female with medical history significant of asthma, CAD, ischemic cardiomyopathy status post CABG, CKD stage III, prediabetes, atrial fibrillation on Xarelto, lower GI bleeding secondary to hemorrhoids. Patient presented secondary to dyspnea and palpitations and found to have be in RVR with known atrial fibrillation in addition to having evidence of acute heart failure. She was started on a Cardizem drip and Lasix IV. Cardiology consulted.   Assessment & Plan:   Principal Problem:   Atrial fibrillation with RVR (HCC) Active Problems:   Chronic combined systolic and diastolic CHF (congestive heart failure) (HCC)   Essential hypertension   CKD (chronic kidney disease), stage III (HCC)   Obesity, Class II, BMI 35-39.9   Atrial fibrillation with RVR Patient is on metoprolol and Xarelto as an outpatient. She reports taking her medication as prescribed. Started on a diltiazem drip and home metoprolol by EDP per cardiology recommendations -Per cardiology -Xarelto (dose adjusted for kidney function); cardiology considering to stop this secondary to non-adherence  Acute on chronic combined systolic/diastolic heart failure Ischemic cardiomyopathy S/P CABG Transthoracic Echocardiogram obtained this admission with an EF of 30-35% which has worsened significantly from previous. BNP is elevated. Denies orthopnea or PND. Clear chest x-ray. Given Lasix 40 mg IV x1 in the ED and started on Lasix IV diuresis by cardiology -Continue home losartan, metoprolol, spironolactone -Per cardiology: Lasix IV  CKD Stage IIIa Non-anion gap mtabolic acidosis Acidosis in setting of chronic kidney disease.  -Start sodium bicarbonate 650 mg BID -Outpatient nephrology referral  Essential hypertension Continue home losartan, metoprolol,  spironolactone  Pre-diabetes No history of diabetes. Last hemoglobin A1C per chart review is 6.4%. Patient is on metformin and Jardiance as an outpatient -SSI  Depressed mood Patient reports losing 4 close people to death in the last 5 months which has caused her to have depressed mood. She has started smoking again because of these recent losses. She currently does not attend therapy and/or sought pharmacologic therapy -Recommend patient to see a therapist and/or see her PCP to initiate medical management for probable depression; Wellbutrin may be a good initial drug in relation to patient's tobacco use  Tobacco use Previously ceased but now intermittent secondary to recent personal loss. Cessation discussed  Hyperlipidemia -Continue Lipitor  History of LGI bleed At the time, deemed secondary to hemorrhoids. Xarelto had been stopped but restarted. Hemoglobin is normal and stable.  Elevated INR In setting of Xarelto use. No prior history of LFT abnormalities. No history of liver disease.  Obesity Body mass index is 32.41 kg/m.   DVT prophylaxis: Xarelto Code Status:   Code Status: Full Code Family Communication: None at bedside Disposition Plan: Discharge home pending cardiology recommendations   Consultants:   Cardiology  Procedures:   TRANSTHORACIC ECHOCARDIOGRAM (06/27/2020) IMPRESSIONS    1. Global hypokinesis with akinesis of the inferior and inferolateral  wall.  2. Left ventricular ejection fraction, by estimation, is 30 to 35%. The  left ventricle has moderate to severely decreased function. The left  ventricle demonstrates regional wall motion abnormalities (see scoring  diagram/findings for description). There  is moderate left ventricular hypertrophy. Left ventricular diastolic  function could not be evaluated.  3. Right ventricular systolic function is normal. The right ventricular  size is normal. There is normal pulmonary artery systolic  pressure.  4. Left atrial size was severely dilated.  5. Right atrial size was moderately dilated.  6. The mitral valve is normal in structure. Mild mitral valve  regurgitation. No evidence of mitral stenosis.  7. The aortic valve is tricuspid. Aortic valve regurgitation is trivial.  Mild to moderate aortic valve sclerosis/calcification is present, without  any evidence of aortic stenosis.  8. The inferior vena cava is dilated in size with >50% respiratory  variability, suggesting right atrial pressure of 8 mmHg.  Antimicrobials:  None    Subjective: Still with some dyspnea but improved from admission at least.   Objective: Vitals:   06/27/20 0100 06/27/20 0352 06/27/20 0744 06/27/20 1100  BP: 99/65 114/78 124/87 131/74  Pulse: 100 (!) 108 (!) 109 (!) 111  Resp: 20 18 18    Temp: 98.3 F (36.8 C) 98.2 F (36.8 C) 98.2 F (36.8 C)   TempSrc: Oral Oral Oral   SpO2: 97% 96% 95%   Weight:      Height:        Intake/Output Summary (Last 24 hours) at 06/27/2020 1527 Last data filed at 06/27/2020 0553 Gross per 24 hour  Intake 857.5 ml  Output 2750 ml  Net -1892.5 ml   Filed Weights   06/26/20 1414  Weight: 95.3 kg    Examination:  General exam: Appears calm and comfortable Respiratory system: Diffusely mild rales bilaterally. Respiratory effort normal. Cardiovascular system: S1 & S2 heard, irregular rhythm, tachycardia Gastrointestinal system: Abdomen is nondistended, soft and nontender. No organomegaly or masses felt. Normal bowel sounds heard. Central nervous system: Alert and oriented. No focal neurological deficits. Musculoskeletal: Mild edema. No calf tenderness Skin: No cyanosis. No rashes Psychiatry: Judgement and insight appear normal. Mood & affect appropriate.     Data Reviewed: I have personally reviewed following labs and imaging studies  CBC Lab Results  Component Value Date   WBC 7.9 06/27/2020   RBC 4.99 06/27/2020   HGB 13.8 06/27/2020    HCT 43.4 06/27/2020   MCV 87.0 06/27/2020   MCH 27.7 06/27/2020   PLT 147 (L) 06/27/2020   MCHC 31.8 06/27/2020   RDW 17.6 (H) 06/27/2020   LYMPHSABS 1.7 10/29/2019   MONOABS 0.5 10/29/2019   EOSABS 0.2 10/29/2019   BASOSABS 0.0 29/56/2130     Last metabolic panel Lab Results  Component Value Date   NA 139 06/27/2020   K 3.4 (L) 06/27/2020   CL 112 (H) 06/27/2020   CO2 18 (L) 06/27/2020   BUN 23 06/27/2020   CREATININE 1.24 (H) 06/27/2020   GLUCOSE 144 (H) 06/27/2020   GFRNONAA 45 (L) 06/27/2020   GFRAA 53 (L) 06/16/2020   CALCIUM 8.8 (L) 06/27/2020   PHOS 3.0 11/09/2014   PROT 6.9 02/10/2020   ALBUMIN 3.4 (L) 02/10/2020   BILITOT 0.5 02/10/2020   ALKPHOS 136 (H) 02/10/2020   AST 13 (L) 02/10/2020   ALT 11 02/10/2020   ANIONGAP 9 06/27/2020    CBG (last 3)  Recent Labs    06/26/20 1433 06/27/20 0747 06/27/20 1131  GLUCAP 113* 145* 95     GFR: Estimated Creatinine Clearance: 46.9 mL/min (A) (by C-G formula based on SCr of 1.24 mg/dL (H)).  Coagulation Profile: Recent Labs  Lab 06/26/20 1423 06/27/20 0209  INR 2.1* 3.3*    Recent Results (from the past 240 hour(s))  Respiratory Panel by RT PCR (Flu A&B, Covid) - Nasopharyngeal Swab     Status: None   Collection Time: 06/26/20  5:10 PM   Specimen: Nasopharyngeal Swab  Result Value Ref Range  Status   SARS Coronavirus 2 by RT PCR NEGATIVE NEGATIVE Final    Comment: (NOTE) SARS-CoV-2 target nucleic acids are NOT DETECTED.  The SARS-CoV-2 RNA is generally detectable in upper respiratoy specimens during the acute phase of infection. The lowest concentration of SARS-CoV-2 viral copies this assay can detect is 131 copies/mL. A negative result does not preclude SARS-Cov-2 infection and should not be used as the sole basis for treatment or other patient management decisions. A negative result may occur with  improper specimen collection/handling, submission of specimen other than nasopharyngeal swab,  presence of viral mutation(s) within the areas targeted by this assay, and inadequate number of viral copies (<131 copies/mL). A negative result must be combined with clinical observations, patient history, and epidemiological information. The expected result is Negative.  Fact Sheet for Patients:  PinkCheek.be  Fact Sheet for Healthcare Providers:  GravelBags.it  This test is no t yet approved or cleared by the [place] FDA and  has been authorized for detection and/or diagnosis of SARS-CoV-2 by FDA under an Emergency Use Authorization (EUA). This EUA will remain  in effect (meaning this test can be used) for the duration of the COVID-19 declaration under Section 564(b)(1) of the Act, 21 U.S.C. section 360bbb-3(b)(1), unless the authorization is terminated or revoked sooner.     Influenza A by PCR NEGATIVE NEGATIVE Final   Influenza B by PCR NEGATIVE NEGATIVE Final    Comment: (NOTE) The Xpert Xpress SARS-CoV-2/FLU/RSV assay is intended as an aid in  the diagnosis of influenza from Nasopharyngeal swab specimens and  should not be used as a sole basis for treatment. Nasal washings and  aspirates are unacceptable for Xpert Xpress SARS-CoV-2/FLU/RSV  testing.  Fact Sheet for Patients: PinkCheek.be  Fact Sheet for Healthcare Providers: GravelBags.it  This test is not yet approved or cleared by the [place] FDA and  has been authorized for detection and/or diagnosis of SARS-CoV-2 by  FDA under an Emergency Use Authorization (EUA). This EUA will remain  in effect (meaning this test can be used) for the duration of the  Covid-19 declaration under Section 564(b)(1) of the Act, 21  U.S.C. section 360bbb-3(b)(1), unless the authorization is  terminated or revoked. Performed at [facility], [place] [address] [place], [place] [address]          Radiology Studies: DG Chest Portable 1 View  Result Date: 06/26/2020 CLINICAL DATA:  Tachycardia, weakness for 1 week, wheezing EXAM: PORTABLE CHEST 1 VIEW COMPARISON:  05/04/2020 FINDINGS: Single frontal view of the chest demonstrates a stable cardiac silhouette. Postsurgical changes from median sternotomy. No airspace disease, effusion, or pneumothorax. No acute bony abnormalities. IMPRESSION: 1. Stable exam, no acute process. Electronically Signed   By: Randa Ngo M.D.   On: 06/26/2020 15:13   ECHOCARDIOGRAM COMPLETE  Result Date: 06/27/2020    ECHOCARDIOGRAM REPORT   Patient Name:   [name] [name] Date of Exam: 06/27/2020 Medical Rec #:  [id number]     Height:       67.5 in Accession #:    [id number]    Weight:       210.0 lb Date of Birth:  04-09-1946     BSA:          2.076 m Patient Age:    12 years      BP:           122/107 mmHg Patient Gender: F  HR:           108 bpm. Exam Location:  Inpatient Procedure: 2D Echo, Cardiac Doppler and Color Doppler Indications:    I48.0 Paroxysmal atrial fibrillation  History:        Patient has prior history of Echocardiogram examinations, most                 recent 12/02/2019. Cardiomyopathy, CAD, Prior CABG, COPD and                 Carotid Disease; Risk Factors:Hypertension. CKD. GERD.  Sonographer:    Jonelle Sidle Dance Referring Phys: [place]  1. Global hypokinesis with akinesis of the inferior and inferolateral wall.  2. Left ventricular ejection fraction, by estimation, is 30 to 35%. The left ventricle has moderate to severely decreased function. The left ventricle demonstrates regional wall motion abnormalities (see scoring diagram/findings for description). There is moderate left ventricular hypertrophy. Left ventricular diastolic function could not be evaluated.  3. Right ventricular systolic function is normal. The right ventricular size is normal. There is normal pulmonary artery systolic pressure.   4. Left atrial size was severely dilated.  5. Right atrial size was moderately dilated.  6. The mitral valve is normal in structure. Mild mitral valve regurgitation. No evidence of mitral stenosis.  7. The aortic valve is tricuspid. Aortic valve regurgitation is trivial. Mild to moderate aortic valve sclerosis/calcification is present, without any evidence of aortic stenosis.  8. The inferior vena cava is dilated in size with >50% respiratory variability, suggesting right atrial pressure of 8 mmHg. FINDINGS  Left Ventricle: Left ventricular ejection fraction, by estimation, is 30 to 35%. The left ventricle has moderate to severely decreased function. The left ventricle demonstrates regional wall motion abnormalities. The left ventricular internal cavity size was normal in size. There is moderate left ventricular hypertrophy. Left ventricular diastolic function could not be evaluated due to atrial fibrillation. Left ventricular diastolic function could not be evaluated. Right Ventricle: The right ventricular size is normal.Right ventricular systolic function is normal. There is normal pulmonary artery systolic pressure. The tricuspid regurgitant velocity is 2.34 m/s, and with an assumed right atrial pressure of 8 mmHg, the estimated right ventricular systolic pressure is 97.6 mmHg. Left Atrium: Left atrial size was severely dilated. Right Atrium: Right atrial size was moderately dilated. Pericardium: There is no evidence of pericardial effusion. Mitral Valve: The mitral valve is normal in structure. Mild mitral annular calcification. Mild mitral valve regurgitation. No evidence of mitral valve stenosis. Tricuspid Valve: The tricuspid valve is normal in structure. Tricuspid valve regurgitation is mild . No evidence of tricuspid stenosis. Aortic Valve: The aortic valve is tricuspid. Aortic valve regurgitation is trivial. Aortic regurgitation PHT measures 330 msec. Mild to moderate aortic valve sclerosis/calcification  is present, without any evidence of aortic stenosis. Aortic valve mean gradient measures 7.0 mmHg. Aortic valve peak gradient measures 15.1 mmHg. Pulmonic Valve: The pulmonic valve was normal in structure. Pulmonic valve regurgitation is not visualized. No evidence of pulmonic stenosis. Aorta: The aortic root is normal in size and structure. Venous: The inferior vena cava is dilated in size with greater than 50% respiratory variability, suggesting right atrial pressure of 8 mmHg. IAS/Shunts: No atrial level shunt detected by color flow Doppler. Additional Comments: Global hypokinesis with akinesis of the inferior and inferolateral wall.  LEFT VENTRICLE PLAX 2D LVIDd:         4.13 cm LVIDs:         3.52  cm LV PW:         1.57 cm LV IVS:        1.38 cm LVOT diam:     2.00 cm LV SV:         29 LV SV Index:   14 LVOT Area:     3.14 cm  RIGHT VENTRICLE          IVC RV Basal diam:  3.87 cm  IVC diam: 3.08 cm RV Mid diam:    2.20 cm TAPSE (M-mode): 1.3 cm LEFT ATRIUM              Index       RIGHT ATRIUM           Index LA diam:        5.70 cm  2.75 cm/m  RA Area:     24.00 cm LA Vol (A2C):   137.0 ml 65.99 ml/m RA Volume:   82.40 ml  39.69 ml/m LA Vol (A4C):   78.4 ml  37.76 ml/m LA Biplane Vol: 104.0 ml 50.10 ml/m  AORTIC VALVE AV Area (Vmax):    1.18 cm AV Area (Vmean):   1.03 cm AV Area (VTI):     0.92 cm AV Vmax:           194.00 cm/s AV Vmean:          119.000 cm/s AV VTI:            0.316 m AV Peak Grad:      15.1 mmHg AV Mean Grad:      7.0 mmHg LVOT Vmax:         72.80 cm/s LVOT Vmean:        39.150 cm/s LVOT VTI:          0.093 m LVOT/AV VTI ratio: 0.29 AI PHT:            330 msec  AORTA Ao Root diam: 2.90 cm MITRAL VALVE               TRICUSPID VALVE MV Area (PHT): 4.13 cm    TR Peak grad:   21.9 mmHg MV Decel Time: 184 msec    TR Vmax:        234.00 cm/s MV E velocity: 90.00 cm/s                            SHUNTS                            Systemic VTI:  0.09 m                            Systemic  Diam: 2.00 cm Kirk Ruths MD Electronically signed by Kirk Ruths MD Signature Date/Time: 06/27/2020/12:52:17 PM    Final         Scheduled Meds: . furosemide  60 mg Intravenous BID  . insulin aspart  0-15 Units Subcutaneous TID WC  . losartan  25 mg Oral Daily   And  . losartan  12.5 mg Oral QHS  . metoprolol succinate  50 mg Oral Daily  . potassium chloride  40 mEq Oral Once  . rivaroxaban  20 mg Oral Q supper  . sodium bicarbonate  650 mg Oral BID  . spironolactone  25 mg Oral Daily   Continuous Infusions: . diltiazem (CARDIZEM)  infusion 10 mg/hr (06/27/20 0744)     LOS: 0 days     Cordelia Poche, MD [facility] 06/27/2020, 3:27 PM  If 7PM-7AM, please contact night-coverage [email or web address]

## 2020-06-28 LAB — BASIC METABOLIC PANEL
Anion gap: 10 (ref 5–15)
BUN: 26 mg/dL — ABNORMAL HIGH (ref 8–23)
CO2: 22 mmol/L (ref 22–32)
Calcium: 9.4 mg/dL (ref 8.9–10.3)
Chloride: 108 mmol/L (ref 98–111)
Creatinine, Ser: 1.28 mg/dL — ABNORMAL HIGH (ref 0.44–1.00)
GFR, Estimated: 44 mL/min — ABNORMAL LOW (ref >60)
Glucose, Bld: 113 mg/dL — ABNORMAL HIGH (ref 70–99)
Potassium: 3.7 mmol/L (ref 3.5–5.1)
Sodium: 140 mmol/L (ref 135–145)

## 2020-06-28 LAB — GLUCOSE, CAPILLARY
Glucose-Capillary: 130 mg/dL — ABNORMAL HIGH (ref 70–99)
Glucose-Capillary: 112 mg/dL — ABNORMAL HIGH (ref 70–99)
Glucose-Capillary: 118 mg/dL — ABNORMAL HIGH (ref 70–99)
Glucose-Capillary: 125 mg/dL — ABNORMAL HIGH (ref 70–99)

## 2020-06-28 MED ORDER — BISACODYL 10 MG RE SUPP
10.0000 mg | Freq: Once | RECTAL | Status: AC
  Administered 2020-06-28: 10 mg via RECTAL
  Filled 2020-06-28 (×2): qty 1

## 2020-06-28 MED ORDER — DILTIAZEM LOAD VIA INFUSION
10.0000 mg | Freq: Once | INTRAVENOUS | Status: AC
  Administered 2020-06-28: 10 mg via INTRAVENOUS
  Filled 2020-06-28: qty 10

## 2020-06-28 MED ORDER — POLYETHYLENE GLYCOL 3350 17 G PO PACK
17.0000 g | PACK | Freq: Every day | ORAL | Status: DC | PRN
  Administered 2020-06-28: 17 g via ORAL
  Filled 2020-06-28: qty 1

## 2020-06-28 MED ORDER — POTASSIUM CHLORIDE CRYS ER 20 MEQ PO TBCR
40.0000 meq | EXTENDED_RELEASE_TABLET | Freq: Once | ORAL | Status: AC
  Administered 2020-06-28: 40 meq via ORAL
  Filled 2020-06-28: qty 2

## 2020-06-28 MED ORDER — METOPROLOL SUCCINATE ER 50 MG PO TB24
75.0000 mg | ORAL_TABLET | Freq: Every day | ORAL | Status: DC
  Administered 2020-06-28 – 2020-06-29 (×2): 75 mg via ORAL
  Filled 2020-06-28 (×2): qty 1

## 2020-06-28 NOTE — Progress Notes (Addendum)
Progress Note  Patient Name: [name] [name] Date of Encounter: 06/28/2020  [facility] Cardiologist: [name], MD   Subjective   Denies any chest pain or SOB.  She put out 1.35L yesterday and is net neg 3L.  Also constipated   Inpatient Medications    Scheduled Meds: . atorvastatin  10 mg Oral QHS  . furosemide  60 mg Intravenous BID  . insulin aspart  0-15 Units Subcutaneous TID WC  . losartan  25 mg Oral Daily   And  . losartan  12.5 mg Oral QHS  . metoprolol succinate  50 mg Oral Daily  . rivaroxaban  20 mg Oral Q supper  . sodium bicarbonate  650 mg Oral BID  . spironolactone  25 mg Oral Daily   Continuous Infusions: . diltiazem (CARDIZEM) infusion 12.5 mg/hr (06/28/20 0600)   PRN Meds: acetaminophen, ondansetron (ZOFRAN) IV   Vital Signs    Vitals:   06/27/20 2300 06/28/20 0306 06/28/20 0606 06/28/20 0725  BP: 106/87 106/66 122/70 129/81  Pulse: 72 98 (!) 108 (!) 35  Resp: 17 18 20 20   Temp: 97.6 F (36.4 C) 97.6 F (36.4 C) 97.6 F (36.4 C) 97.7 F (36.5 C)  TempSrc: Oral Oral Oral Oral  SpO2: 96% 97% 98% 96%  Weight:      Height:        Intake/Output Summary (Last 24 hours) at 06/28/2020 0758 Last data filed at 06/28/2020 0600 Gross per 24 hour  Intake 250 ml  Output 1350 ml  Net -1100 ml   Last 3 Weights 06/26/2020 06/16/2020 03/09/2020  Weight (lbs) 210 lb 210 lb 215 lb  Weight (kg) 95.255 kg 95.255 kg 97.523 kg      Telemetry    Atrial fibrillation with RVR  - Personally Reviewed  ECG    No new EKG to review - Personally Reviewed  Physical Exam   GEN: Well nourished, well developed in no acute distress HEENT: Normal NECK: No JVD; No carotid bruits LYMPHATICS: No lymphadenopathy CARDIAC:irregularly irregular, no murmurs, rubs, gallops RESPIRATORY:  Clear to auscultation without rales, wheezing or rhonchi  ABDOMEN: Soft, non-tender, non-distended MUSCULOSKELETAL:  No edema; No deformity  SKIN: Warm and dry NEUROLOGIC:   Alert and oriented x 3 PSYCHIATRIC:  Normal affect    Labs    High Sensitivity Troponin:   Recent Labs  Lab 06/26/20 1423 06/26/20 1608  TROPONINIHS 29* 33*      Chemistry Recent Labs  Lab 06/26/20 1423 06/27/20 0209  NA 141 139  K 3.6 3.4*  CL 113* 112*  CO2 16* 18*  GLUCOSE 125* 144*  BUN 23 23  CREATININE 1.31* 1.24*  CALCIUM 9.2 8.8*  GFRNONAA 42* 45*  ANIONGAP 12 9     Hematology Recent Labs  Lab 06/26/20 1423 06/27/20 0209  WBC 8.3 7.9  RBC 5.17* 4.99  HGB 14.2 13.8  HCT 44.7 43.4  MCV 86.5 87.0  MCH 27.5 27.7  MCHC 31.8 31.8  RDW 17.7* 17.6*  PLT 158 147*    BNP Recent Labs  Lab 06/26/20 1439  BNP 927.9*     DDimer No results for input(s): DDIMER in the last 168 hours.    Radiology    DG Chest Portable 1 View  Result Date: 06/26/2020 CLINICAL DATA:  Tachycardia, weakness for 1 week, wheezing EXAM: PORTABLE CHEST 1 VIEW COMPARISON:  05/04/2020 FINDINGS: Single frontal view of the chest demonstrates a stable cardiac silhouette. Postsurgical changes from median sternotomy. No airspace disease, effusion,  or pneumothorax. No acute bony abnormalities. IMPRESSION: 1. Stable exam, no acute process. Electronically Signed   By: Randa Ngo M.D.   On: 06/26/2020 15:13   ECHOCARDIOGRAM COMPLETE  Result Date: 06/27/2020    ECHOCARDIOGRAM REPORT   Patient Name:   [name] [name] Date of Exam: 06/27/2020 Medical Rec #:  [id number]     Height:       67.5 in Accession #:    [id number]    Weight:       210.0 lb Date of Birth:  November 09, 1945     BSA:          2.076 m Patient Age:    74 years      BP:           122/107 mmHg Patient Gender: F             HR:           108 bpm. Exam Location:  Inpatient Procedure: 2D Echo, Cardiac Doppler and Color Doppler Indications:    I48.0 Paroxysmal atrial fibrillation  History:        Patient has prior history of Echocardiogram examinations, most                 recent 12/02/2019. Cardiomyopathy, CAD, Prior CABG, COPD and                  Carotid Disease; Risk Factors:Hypertension. CKD. GERD.  Sonographer:    Jonelle Sidle Dance Referring Phys: [place]  1. Global hypokinesis with akinesis of the inferior and inferolateral wall.  2. Left ventricular ejection fraction, by estimation, is 30 to 35%. The left ventricle has moderate to severely decreased function. The left ventricle demonstrates regional wall motion abnormalities (see scoring diagram/findings for description). There is moderate left ventricular hypertrophy. Left ventricular diastolic function could not be evaluated.  3. Right ventricular systolic function is normal. The right ventricular size is normal. There is normal pulmonary artery systolic pressure.  4. Left atrial size was severely dilated.  5. Right atrial size was moderately dilated.  6. The mitral valve is normal in structure. Mild mitral valve regurgitation. No evidence of mitral stenosis.  7. The aortic valve is tricuspid. Aortic valve regurgitation is trivial. Mild to moderate aortic valve sclerosis/calcification is present, without any evidence of aortic stenosis.  8. The inferior vena cava is dilated in size with >50% respiratory variability, suggesting right atrial pressure of 8 mmHg. FINDINGS  Left Ventricle: Left ventricular ejection fraction, by estimation, is 30 to 35%. The left ventricle has moderate to severely decreased function. The left ventricle demonstrates regional wall motion abnormalities. The left ventricular internal cavity size was normal in size. There is moderate left ventricular hypertrophy. Left ventricular diastolic function could not be evaluated due to atrial fibrillation. Left ventricular diastolic function could not be evaluated. Right Ventricle: The right ventricular size is normal.Right ventricular systolic function is normal. There is normal pulmonary artery systolic pressure. The tricuspid regurgitant velocity is 2.34 m/s, and with an assumed right atrial  pressure of 8 mmHg, the estimated right ventricular systolic pressure is 51.7 mmHg. Left Atrium: Left atrial size was severely dilated. Right Atrium: Right atrial size was moderately dilated. Pericardium: There is no evidence of pericardial effusion. Mitral Valve: The mitral valve is normal in structure. Mild mitral annular calcification. Mild mitral valve regurgitation. No evidence of mitral valve stenosis. Tricuspid Valve: The tricuspid valve is normal in structure. Tricuspid valve regurgitation is mild . No  evidence of tricuspid stenosis. Aortic Valve: The aortic valve is tricuspid. Aortic valve regurgitation is trivial. Aortic regurgitation PHT measures 330 msec. Mild to moderate aortic valve sclerosis/calcification is present, without any evidence of aortic stenosis. Aortic valve mean gradient measures 7.0 mmHg. Aortic valve peak gradient measures 15.1 mmHg. Pulmonic Valve: The pulmonic valve was normal in structure. Pulmonic valve regurgitation is not visualized. No evidence of pulmonic stenosis. Aorta: The aortic root is normal in size and structure. Venous: The inferior vena cava is dilated in size with greater than 50% respiratory variability, suggesting right atrial pressure of 8 mmHg. IAS/Shunts: No atrial level shunt detected by color flow Doppler. Additional Comments: Global hypokinesis with akinesis of the inferior and inferolateral wall.  LEFT VENTRICLE PLAX 2D LVIDd:         4.13 cm LVIDs:         3.52 cm LV PW:         1.57 cm LV IVS:        1.38 cm LVOT diam:     2.00 cm LV SV:         29 LV SV Index:   14 LVOT Area:     3.14 cm  RIGHT VENTRICLE          IVC RV Basal diam:  3.87 cm  IVC diam: 3.08 cm RV Mid diam:    2.20 cm TAPSE (M-mode): 1.3 cm LEFT ATRIUM              Index       RIGHT ATRIUM           Index LA diam:        5.70 cm  2.75 cm/m  RA Area:     24.00 cm LA Vol (A2C):   137.0 ml 65.99 ml/m RA Volume:   82.40 ml  39.69 ml/m LA Vol (A4C):   78.4 ml  37.76 ml/m LA Biplane Vol:  104.0 ml 50.10 ml/m  AORTIC VALVE AV Area (Vmax):    1.18 cm AV Area (Vmean):   1.03 cm AV Area (VTI):     0.92 cm AV Vmax:           194.00 cm/s AV Vmean:          119.000 cm/s AV VTI:            0.316 m AV Peak Grad:      15.1 mmHg AV Mean Grad:      7.0 mmHg LVOT Vmax:         72.80 cm/s LVOT Vmean:        39.150 cm/s LVOT VTI:          0.093 m LVOT/AV VTI ratio: 0.29 AI PHT:            330 msec  AORTA Ao Root diam: 2.90 cm MITRAL VALVE               TRICUSPID VALVE MV Area (PHT): 4.13 cm    TR Peak grad:   21.9 mmHg MV Decel Time: 184 msec    TR Vmax:        234.00 cm/s MV E velocity: 90.00 cm/s                            SHUNTS  Systemic VTI:  0.09 m                            Systemic Diam: 2.00 cm Kirk Ruths MD Electronically signed by Kirk Ruths MD Signature Date/Time: 06/27/2020/12:52:17 PM    Final     Cardiac Studies   none  Patient Profile     74 y.o. female with a history of CAD s/p CABG, HFpEF (EF 55-60% 11/2019), ICM, HTN, HL, CKD III, COPD, ongoing tob abuse, PAF on xarelto, prior GIB, and type II DM, who is being seen for the evaluation of Afib RVR and volume overlolad at the request of Dr. Lonny Prude.  Assessment & Plan    1. Atrial fibrillation with RVR -Noncompliance documented in the chart.  She reports she likely has missed doses of her medication.  She has been deemed not a great candidate for rhythm control strategy or ablation due to noncompliance. -Unclear if HFpEF put her into A. fib with RVR or A. fib with RVR put her into HFpEF.  Also noticeably diminished airway movement.  Question COPD. -HR has improved on diltiazem drip but still elevated.   -will bolus with 10mg  IV Cardizem again and increase gtt to 15mg /hr -Increase Toprol XL to 75mg  daily -She reports she did miss her Xarelto.   -Given history of noncompliance and GI bleed, would recommend rate control strategy moving forward.   2.  Shortness of breath/elevated  BNP/HFpEF/COPD? -Elevated BNP.  Elevated JVD on admit.   -started on  Lasix IV 60 mg twice daily. -she put out 1.35L and is net neg 3L.   -She is noticeably not moving much air.  She does smoke and has a history of this.  Question COPD.   -Her chest x-ray is inconsistent with pleural effusions and I do not think her entire picture is explained by HFpEF.   -Pulmonary function testing in 2016 with moderate airway obstruction as well. -Repeat echocardiogram pending -replete K+ t keep >  4.  -SCr stable at 1.24 yesterday and pending this am -will see what SCr is today to determine ongoing diuretics  3.  Hyperchloremic non-anion gap metabolic acidosis -She had a metabolic acidosis for several months.   -per [facility]  4.  CAD status post CABG -Negative Myoview in 2020.   -Troponin is minimally elevated and flat c/w demand ischemia.  No concerns for ACS. -continue BB -no ASA due to DOAC  5. Constipation -PRN miralax    I have spent a total of 35 minutes with patient reviewing hospital notes , telemetry, EKGs, labs and examining patient as well as establishing an assessment and plan that was discussed with the patient.  > 50% of time was spent in direct patient care.    For questions or updates, please contact [place] Please consult [email or web address] for contact info under        Signed, Fransico Him, MD  06/28/2020, 7:58 AM

## 2020-06-28 NOTE — Progress Notes (Signed)
Care transferred to cardiology service. Discussed with Dr. Radford Pax who agreed with transfer of care. [facility] will sign off. Please re-consult if we are needed.   Cordelia Poche, MD [facility] 06/28/2020, 9:08 AM

## 2020-06-28 NOTE — Plan of Care (Signed)

## 2020-06-29 ENCOUNTER — Other Ambulatory Visit: Payer: Self-pay

## 2020-06-29 ENCOUNTER — Telehealth: Payer: Self-pay | Admitting: Cardiology

## 2020-06-29 LAB — BASIC METABOLIC PANEL
Anion gap: 12 (ref 5–15)
BUN: 31 mg/dL — ABNORMAL HIGH (ref 8–23)
CO2: 22 mmol/L (ref 22–32)
Calcium: 9.2 mg/dL (ref 8.9–10.3)
Chloride: 104 mmol/L (ref 98–111)
Creatinine, Ser: 1.47 mg/dL — ABNORMAL HIGH (ref 0.44–1.00)
GFR, Estimated: 37 mL/min — ABNORMAL LOW (ref >60)
Glucose, Bld: 139 mg/dL — ABNORMAL HIGH (ref 70–99)
Potassium: 3.7 mmol/L (ref 3.5–5.1)
Sodium: 138 mmol/L (ref 135–145)

## 2020-06-29 LAB — CBC
HCT: 48.4 % — ABNORMAL HIGH (ref 36.0–46.0)
Hemoglobin: 15.6 g/dL — ABNORMAL HIGH (ref 12.0–15.0)
MCH: 26.8 pg (ref 26.0–34.0)
MCHC: 32.2 g/dL (ref 30.0–36.0)
MCV: 83 fL (ref 80.0–100.0)
Platelets: 177 10*3/uL (ref 150–400)
RBC: 5.83 MIL/uL — ABNORMAL HIGH (ref 3.87–5.11)
RDW: 17.1 % — ABNORMAL HIGH (ref 11.5–15.5)
WBC: 10 10*3/uL (ref 4.0–10.5)
nRBC: 0 % (ref 0.0–0.2)

## 2020-06-29 LAB — GLUCOSE, CAPILLARY
Glucose-Capillary: 110 mg/dL — ABNORMAL HIGH (ref 70–99)
Glucose-Capillary: 107 mg/dL — ABNORMAL HIGH (ref 70–99)
Glucose-Capillary: 126 mg/dL — ABNORMAL HIGH (ref 70–99)
Glucose-Capillary: 142 mg/dL — ABNORMAL HIGH (ref 70–99)
Glucose-Capillary: 92 mg/dL (ref 70–99)

## 2020-06-29 MED ORDER — FUROSEMIDE 20 MG PO TABS: 40.0000 mg | ORAL_TABLET | Freq: Every day | ORAL | 2 refills | Status: DC

## 2020-06-29 MED ORDER — LOSARTAN POTASSIUM 25 MG PO TABS: 25.0000 mg | ORAL_TABLET | Freq: Two times a day (BID) | ORAL | 6 refills | Status: DC

## 2020-06-29 MED ORDER — DIGOXIN 125 MCG PO TABS
0.0625 mg | ORAL_TABLET | Freq: Every day | ORAL | Status: DC
  Administered 2020-06-29 – 2020-06-30 (×2): 0.0625 mg via ORAL
  Filled 2020-06-29 (×2): qty 1

## 2020-06-29 MED ORDER — LOSARTAN POTASSIUM 25 MG PO TABS: 25.0000 mg | ORAL_TABLET | Freq: Every day | ORAL | Status: DC

## 2020-06-29 MED ORDER — LOSARTAN POTASSIUM 25 MG PO TABS
25.0000 mg | ORAL_TABLET | Freq: Two times a day (BID) | ORAL | Status: DC
  Administered 2020-06-29 – 2020-06-30 (×2): 25 mg via ORAL
  Filled 2020-06-29 (×2): qty 1

## 2020-06-29 MED ORDER — DIGOXIN 62.5 MCG PO TABS: 0.0625 mg | ORAL_TABLET | Freq: Every day | ORAL | 6 refills | Status: DC

## 2020-06-29 MED ORDER — METOPROLOL SUCCINATE ER 25 MG PO TB24: 75.0000 mg | ORAL_TABLET | Freq: Every day | ORAL | 6 refills | Status: DC

## 2020-06-29 NOTE — Discharge Summary (Addendum)
Discharge Summary    Patient ID: OSHA RANE MRN: [id number]; DOB: Jan 15, 1946  Admit date: 06/26/2020 Discharge date: 06/30/2020  Primary Care Provider: [name], MD  Primary Cardiologist: [name], MD  Primary Electrophysiologist:  None   Discharge Diagnoses    Principal Problem:   Atrial fibrillation with RVR [facility]) Active Problems:   Chronic combined systolic and diastolic CHF (congestive heart failure) ([place])   Essential hypertension   CKD (chronic kidney disease), stage III ([place])   Obesity, Class II, BMI 35-39.9    Diagnostic Studies/Procedures    Echocardiogram 06/27/20: 1. Global hypokinesis with akinesis of the inferior and inferolateral  wall.  2. Left ventricular ejection fraction, by estimation, is 30 to 35%. The  left ventricle has moderate to severely decreased function. The left  ventricle demonstrates regional wall motion abnormalities (see scoring  diagram/findings for description). There  is moderate left ventricular hypertrophy. Left ventricular diastolic  function could not be evaluated.  3. Right ventricular systolic function is normal. The right ventricular  size is normal. There is normal pulmonary artery systolic pressure.  4. Left atrial size was severely dilated.  5. Right atrial size was moderately dilated.  6. The mitral valve is normal in structure. Mild mitral valve  regurgitation. No evidence of mitral stenosis.  7. The aortic valve is tricuspid. Aortic valve regurgitation is trivial.  Mild to moderate aortic valve sclerosis/calcification is present, without  any evidence of aortic stenosis.  8. The inferior vena cava is dilated in size with >50% respiratory  variability, suggesting right atrial pressure of 8 mmHg.  _____________   History of Present Illness     [name] [name] is a 74 y.o.femalewith a history of CAD s/p CABG, HFpEF (EF 55-60% 11/2019), ICM, HTN, HL, CKD III, COPD, ongoing tob abuse, PAF on xarelto, prior  GIB,and type II DM. She was initially evaluated for atrial fibrillation in April 2016 found to have a severely reduced LV function.  Subsequent ischemic work-up revealed severe multivessel CAD and she underwent CABG x2 with the LIMA to the LAD and vein graft to the obtuse marginal.  EF was as low as 30 to 35% in December 2019.  She was unable to tolerate Entresto in the past due to chest pain.  Compliance has always been an issue as well.  She underwent stress testing in January 2020 which was low risk and showed no reversible ischemia with an EF of 44%.  In February 2021, she was seen in the emergency department secondary to recurrent atrial fibrillation.  At the time, she was off of Xarelto in the setting of dark stools.  She was treated with IV diltiazem with subsequent conversion to sinus rhythm.  After being seen by GI, Xarelto was resumed.  She was never felt to be a suitable candidate for cardioversion or catheter ablation secondary to history of medication noncompliance.  Echocardiogram in April 2021 showed improvement in LV function with an EF of 55 to 60%.  She was seen in the emergency department in September for bilateral lower extremity edema.  She was advised to take an extra dose of Lasix and follow-up with cardiology, which she did on November 2.  At that time, she was doing reasonably well without anginal symptoms or significant dyspnea.  She was euvolemic on examination with stable vital signs.  Following cardiology visit November 2, patient says that she began to notice increasing dyspnea.  She does not routinely weigh herself at home and reports relatively good compliance  but notes that she sometimes misses evening doses of medications if she is out with her friends.  She misses Xarelto at least 1 or 2 times a month.  Beginning 2 days ago, Wednesday, November 10, she started noticing increasing dyspnea and also palpitations when lying down at bed at night.  She did go out with her friends  Wednesday evening and says that she was short of breath but this did not stop her from having a good time.  Unfortunately, 11/11 and then again 11/12, she noted significant dyspnea on exertion and weakness prompting her to present to the emergency department where she was found to be in atrial fibrillation with rapid ventricular response and a rate of 175.  Lab work notable for mild high-sensitivity troponin elevation (29  33), BNP of 927.9, mild elevation of BUN and creatinine above most recent recording at 23/1.31, and acidosis with a bicarb of 16.  Chest x-ray was read as stable exam without acute process.  Patient is currently on IV diltiazem at 7.5 mg/h and improvement in heart rates down to the 1 teens to 120s.  On arrival, she was given a 500 cc bolus of lactated Ringer's.  2 hours later, she was given with 40 mg of IV Lasix.  Currently, she is resting comfortably.  She does report 2 episodes of brief/fleeting chest pain over the past week.  No chest pain today.  Hospital Course     Consultants: None   1. Atrial fibrillation with RVR: patient presented with Afib with RVR. Rates initially controlled with diltiazem gtt. Home metoprolol uptitrated to 100mg  daily and diltiazem discontinued given drop in EF to 30-35% on echo this admission. She was started on digoxin for rate control. Felt to be a poor candidate for rhythm control/ablation due to non-compliance and history of GI bleed - Continue metoprolol succinate and digoxin for rate control - Continue xarelto for stroke ppx - compliance encouraged. - Would check dig level at follow-up for close monitoring.   2. Acute on chronic combined CHF: patient has a history of chronic combined CHF with improvement in EF to 55-60% 11/2019, however echo this admission revealed EF 30-35% in the setting of afib with RVR. She was diuresed with IV lasix 60mg  BID with UOP net -4.2L this admission. She has prior intolerance to entresto, though tolerating losartan. It  was felt COPD is likely complicated her SOB and referral to pulmonary was made at discharge  - Continue metoprolol succinate, losartan, and spironolactone - Continue digoxin - Continue jardiance - Continue lasix 40mg  daily  3. CAD status post CABG: Negative Myoview in 2020. Troponin is minimally elevated and flat c/w demand ischemia. No concerns for ACS. Not on aspirin give need for DOAC - Continue BB - Continue statin  4. HTN: BP intermittently elevated this admission. Home losartan increased to 25mg  BID.  - Continue metoprolol succinate, losartan, lasix, and spironolactone  5. CKD stage 3: Cr up to 1.5 on the day of discharge, up from baseline 1.2. Lasix held 06/30/20 with plans to restart 06/29/20.  - Would check BMET at follow-up for close monitoring.  Did the patient have an acute coronary syndrome (MI, NSTEMI, STEMI, etc) this admission?:  No                               Did the patient have a percutaneous coronary intervention (stent / angioplasty)?:  No.       _____________  Discharge Vitals Blood pressure 120/70, pulse 98, temperature 98.9 F (37.2 C), resp. rate 16, height 5' 7.5" (1.715 m), weight 91 kg, SpO2 92 %.  Filed Weights   06/26/20 1414 06/29/20 0611  Weight: 95.3 kg 91 kg    GEN: No acute distress.   Neck: No JVD, no carotid bruits Cardiac: IRIR, no murmurs, rubs, or gallops.  Respiratory: Clear to auscultation bilaterally, no wheezes/ rales/ rhonchi GI: NABS, Soft, nontender, non-distended  MS: No edema; No deformity. Neuro:  Nonfocal, moving all extremities spontaneously Psych: Normal affect      Labs & Radiologic Studies    CBC Recent Labs    06/29/20 0952  WBC 10.0  HGB 15.6*  HCT 48.4*  MCV 83.0  PLT 767   Basic Metabolic Panel Recent Labs    06/29/20 0952 06/30/20 0745  NA 138 139  K 3.7 3.8  CL 104 106  CO2 22 23  GLUCOSE 139* 135*  BUN 31* 38*  CREATININE 1.47* 1.52*  CALCIUM 9.2 9.3   Liver Function Tests No results  for input(s): AST, ALT, ALKPHOS, BILITOT, PROT, ALBUMIN in the last 72 hours. No results for input(s): LIPASE, AMYLASE in the last 72 hours. High Sensitivity Troponin:   Recent Labs  Lab 06/26/20 1423 06/26/20 1608  TROPONINIHS 29* 33*    BNP Invalid input(s): POCBNP D-Dimer No results for input(s): DDIMER in the last 72 hours. Hemoglobin A1C No results for input(s): HGBA1C in the last 72 hours. Fasting Lipid Panel No results for input(s): CHOL, HDL, LDLCALC, TRIG, CHOLHDL, LDLDIRECT in the last 72 hours. Thyroid Function Tests No results for input(s): TSH, T4TOTAL, T3FREE, THYROIDAB in the last 72 hours.  Invalid input(s): FREET3 _____________  DG Chest Portable 1 View  Result Date: 06/26/2020 CLINICAL DATA:  Tachycardia, weakness for 1 week, wheezing EXAM: PORTABLE CHEST 1 VIEW COMPARISON:  05/04/2020 FINDINGS: Single frontal view of the chest demonstrates a stable cardiac silhouette. Postsurgical changes from median sternotomy. No airspace disease, effusion, or pneumothorax. No acute bony abnormalities. IMPRESSION: 1. Stable exam, no acute process. Electronically Signed   By: Randa Ngo M.D.   On: 06/26/2020 15:13   ECHOCARDIOGRAM COMPLETE  Result Date: 06/27/2020    ECHOCARDIOGRAM REPORT   Patient Name:   [name] [name] Date of Exam: 06/27/2020 Medical Rec #:  [id number]     Height:       67.5 in Accession #:    [id number]    Weight:       210.0 lb Date of Birth:  Sep 28, 1945     BSA:          2.076 m Patient Age:    74 years      BP:           122/107 mmHg Patient Gender: F             HR:           108 bpm. Exam Location:  Inpatient Procedure: 2D Echo, Cardiac Doppler and Color Doppler Indications:    I48.0 Paroxysmal atrial fibrillation  History:        Patient has prior history of Echocardiogram examinations, most                 recent 12/02/2019. Cardiomyopathy, CAD, Prior CABG, COPD and                 Carotid Disease; Risk Factors:Hypertension. CKD. GERD.  Sonographer:     Jonelle Sidle Dance Referring Phys: Enders  BERGE IMPRESSIONS  1. Global hypokinesis with akinesis of the inferior and inferolateral wall.  2. Left ventricular ejection fraction, by estimation, is 30 to 35%. The left ventricle has moderate to severely decreased function. The left ventricle demonstrates regional wall motion abnormalities (see scoring diagram/findings for description). There is moderate left ventricular hypertrophy. Left ventricular diastolic function could not be evaluated.  3. Right ventricular systolic function is normal. The right ventricular size is normal. There is normal pulmonary artery systolic pressure.  4. Left atrial size was severely dilated.  5. Right atrial size was moderately dilated.  6. The mitral valve is normal in structure. Mild mitral valve regurgitation. No evidence of mitral stenosis.  7. The aortic valve is tricuspid. Aortic valve regurgitation is trivial. Mild to moderate aortic valve sclerosis/calcification is present, without any evidence of aortic stenosis.  8. The inferior vena cava is dilated in size with >50% respiratory variability, suggesting right atrial pressure of 8 mmHg. FINDINGS  Left Ventricle: Left ventricular ejection fraction, by estimation, is 30 to 35%. The left ventricle has moderate to severely decreased function. The left ventricle demonstrates regional wall motion abnormalities. The left ventricular internal cavity size was normal in size. There is moderate left ventricular hypertrophy. Left ventricular diastolic function could not be evaluated due to atrial fibrillation. Left ventricular diastolic function could not be evaluated. Right Ventricle: The right ventricular size is normal.Right ventricular systolic function is normal. There is normal pulmonary artery systolic pressure. The tricuspid regurgitant velocity is 2.34 m/s, and with an assumed right atrial pressure of 8 mmHg, the estimated right ventricular systolic pressure is 19.4 mmHg.  Left Atrium: Left atrial size was severely dilated. Right Atrium: Right atrial size was moderately dilated. Pericardium: There is no evidence of pericardial effusion. Mitral Valve: The mitral valve is normal in structure. Mild mitral annular calcification. Mild mitral valve regurgitation. No evidence of mitral valve stenosis. Tricuspid Valve: The tricuspid valve is normal in structure. Tricuspid valve regurgitation is mild . No evidence of tricuspid stenosis. Aortic Valve: The aortic valve is tricuspid. Aortic valve regurgitation is trivial. Aortic regurgitation PHT measures 330 msec. Mild to moderate aortic valve sclerosis/calcification is present, without any evidence of aortic stenosis. Aortic valve mean gradient measures 7.0 mmHg. Aortic valve peak gradient measures 15.1 mmHg. Pulmonic Valve: The pulmonic valve was normal in structure. Pulmonic valve regurgitation is not visualized. No evidence of pulmonic stenosis. Aorta: The aortic root is normal in size and structure. Venous: The inferior vena cava is dilated in size with greater than 50% respiratory variability, suggesting right atrial pressure of 8 mmHg. IAS/Shunts: No atrial level shunt detected by color flow Doppler. Additional Comments: Global hypokinesis with akinesis of the inferior and inferolateral wall.  LEFT VENTRICLE PLAX 2D LVIDd:         4.13 cm LVIDs:         3.52 cm LV PW:         1.57 cm LV IVS:        1.38 cm LVOT diam:     2.00 cm LV SV:         29 LV SV Index:   14 LVOT Area:     3.14 cm  RIGHT VENTRICLE          IVC RV Basal diam:  3.87 cm  IVC diam: 3.08 cm RV Mid diam:    2.20 cm TAPSE (M-mode): 1.3 cm LEFT ATRIUM              Index  RIGHT ATRIUM           Index LA diam:        5.70 cm  2.75 cm/m  RA Area:     24.00 cm LA Vol (A2C):   137.0 ml 65.99 ml/m RA Volume:   82.40 ml  39.69 ml/m LA Vol (A4C):   78.4 ml  37.76 ml/m LA Biplane Vol: 104.0 ml 50.10 ml/m  AORTIC VALVE AV Area (Vmax):    1.18 cm AV Area (Vmean):   1.03  cm AV Area (VTI):     0.92 cm AV Vmax:           194.00 cm/s AV Vmean:          119.000 cm/s AV VTI:            0.316 m AV Peak Grad:      15.1 mmHg AV Mean Grad:      7.0 mmHg LVOT Vmax:         72.80 cm/s LVOT Vmean:        39.150 cm/s LVOT VTI:          0.093 m LVOT/AV VTI ratio: 0.29 AI PHT:            330 msec  AORTA Ao Root diam: 2.90 cm MITRAL VALVE               TRICUSPID VALVE MV Area (PHT): 4.13 cm    TR Peak grad:   21.9 mmHg MV Decel Time: 184 msec    TR Vmax:        234.00 cm/s MV E velocity: 90.00 cm/s                            SHUNTS                            Systemic VTI:  0.09 m                            Systemic Diam: 2.00 cm Kirk Ruths MD Electronically signed by Kirk Ruths MD Signature Date/Time: 06/27/2020/12:52:17 PM    Final    Disposition   Pt is being discharged home today in good condition.  Follow-up Plans & Appointments     Follow-up Information    Liliane Shi, PA-C Follow up on 07/08/2020.   Specialties: Cardiology, Physician Assistant Why: Please arrive 15 minutes early for your 2:15pm post-hospital cardiology follow-up appointment.  Contact information: [address] [place] [address] [phone]              Discharge Instructions    Ambulatory referral to Pulmonology   Complete by: As directed    Reason for referral: Asthma/COPD   Diet - low sodium heart healthy   Complete by: As directed    Diet - low sodium heart healthy   Complete by: As directed    Increase activity slowly   Complete by: As directed    Increase activity slowly   Complete by: As directed       Discharge Medications   Allergies as of 06/30/2020      Reactions   Bee Venom Anaphylaxis   Ivp Dye [iodinated Diagnostic Agents] Anaphylaxis   Codeine Nausea And Vomiting   Entresto [sacubitril-valsartan]    Chest pain    Shrimp [shellfish Allergy] Swelling  Medication List    STOP taking these medications   metFORMIN 500 MG  tablet Commonly known as: GLUCOPHAGE   senna-docusate 8.6-50 MG tablet Commonly known as: Senokot-S   triamcinolone ointment 0.5 % Commonly known as: KENALOG     TAKE these medications   acetaminophen 500 MG tablet Commonly known as: TYLENOL Take 500 mg by mouth every 6 (six) hours as needed for moderate pain.   allopurinol 300 MG tablet Commonly known as: ZYLOPRIM Take 1 tablet (300 mg total) by mouth daily.   atorvastatin 10 MG tablet Commonly known as: LIPITOR Take 5-10 mg by mouth at bedtime.   CO Q-10 PO Take 1 capsule by mouth daily.   Digoxin 62.5 MCG Tabs Take 0.0625 mg by mouth daily.   famotidine 20 MG tablet Commonly known as: PEPCID Take 20 mg by mouth daily.   furosemide 20 MG tablet Commonly known as: Lasix Take 2 tablets (40 mg total) by mouth daily. What changed: how much to take   JARDIANCE PO Take 0.5 tablets by mouth.   losartan 25 MG tablet Commonly known as: COZAAR Take 1 tablet (25 mg total) by mouth 2 (two) times daily. What changed:   how much to take  when to take this  additional instructions   metoprolol succinate 100 MG 24 hr tablet Commonly known as: TOPROL-XL Take 1 tablet (100 mg total) by mouth daily. Take with or immediately following a meal. What changed:   medication strength  See the new instructions.   pantoprazole 40 MG tablet Commonly known as: PROTONIX Take 40 mg by mouth as needed.   rivaroxaban 20 MG Tabs tablet Commonly known as: XARELTO Take 1 tablet (20 mg total) by mouth daily with supper. Resume from 11/01/2019   spironolactone 25 MG tablet Commonly known as: ALDACTONE Take 1 tablet (25 mg total) by mouth daily.          Outstanding Labs/Studies   Check BMET and digoxin level at follow-up in 1 week.   Duration of Discharge Encounter   Greater than 30 minutes including physician time.  Signed, Abigail Butts, PA-C 06/30/2020, 9:03 AM

## 2020-06-29 NOTE — Progress Notes (Signed)
Patient lives by herself and her HR remain borderline controlled, plan to keep overnight for observation and discharge tomorrow.

## 2020-06-29 NOTE — Plan of Care (Signed)

## 2020-06-29 NOTE — Discharge Instructions (Signed)
Heart Failure Education: °1. Weigh yourself EVERY morning after you go to the bathroom but before you eat or drink anything. Write this number down in a weight log/diary. If you gain 3 pounds overnight or 5 pounds in a week, call the office. °2. Take your medicines as prescribed. If you have concerns about your medications, please call us before you stop taking them.  °3. Eat low salt foods--Limit salt (sodium) to 2000 mg per day. This will help prevent your body from holding onto fluid. Read food labels as many processed foods have a lot of sodium, especially canned goods and prepackaged meats. If you would like some assistance choosing low sodium foods, we would be happy to set you up with a nutritionist. °4. Stay as active as you can everyday. Staying active will give you more energy and make your muscles stronger. Start with 5 minutes at a time and work your way up to 30 minutes a day. Break up your activities--do some in the morning and some in the afternoon. Start with 3 days per week and work your way up to 5 days as you can.  If you have chest pain, feel short of breath, dizzy, or lightheaded, STOP. If you don't feel better after a short rest, call 911. If you do feel better, call the office to let us know you have symptoms with exercise. °5. Limit all fluids for the day to less than 2 liters. Fluid includes all drinks, coffee, juice, ice chips, soup, jello, and all other liquids. ° °

## 2020-06-29 NOTE — Telephone Encounter (Signed)
TOC on 07/08/20 at 2:15pm with Richardson Dopp per Roby Lofts.

## 2020-06-29 NOTE — Patient Outreach (Signed)
  [name]) Care Management Chronic Special Needs Program    06/29/2020  Name: [name] [name], [name] [name]: [birth year]  MRN: [id number]   [name] is enrolled in a chronic special needs plan for Heart Failure. Client presented to hospital on 06/26/20 with dyspnea and palpitations-Placed in observation. Admitted on 06/27/20 atrial fibrillation and volume overload.   Care Coordination: Care Plan sent to [facility] Utilization management team.  Plan: Care coordinate with [facility] and inpatient care management team as indicated. Per record, [facility] is in the process of outreach attempts to engage client. RNCM notified [facility] of above and request continued outreach once client is discharged. RNCM will continue to follow.  Thea Silversmith, RN, MSN, [place] [phone]

## 2020-06-29 NOTE — Progress Notes (Signed)
BS checked at 2136 results 142. Rechecked BP 2221 results 92.

## 2020-06-29 NOTE — Progress Notes (Addendum)
Progress Note  Patient Name: [name] [name] Date of Encounter: 06/29/2020  Primary Cardiologist: [name], MD   Subjective   Patient resting comfortably in bed. She states that her shortness of breath has improved. She denies new complaints today.   Inpatient Medications    Scheduled Meds:  atorvastatin  10 mg Oral QHS   furosemide  60 mg Intravenous BID   insulin aspart  0-15 Units Subcutaneous TID WC   losartan  25 mg Oral Daily   And   losartan  12.5 mg Oral QHS   metoprolol succinate  75 mg Oral Daily   rivaroxaban  20 mg Oral Q supper   sodium bicarbonate  650 mg Oral BID   spironolactone  25 mg Oral Daily   Continuous Infusions:  diltiazem (CARDIZEM) infusion 15 mg/hr (06/29/20 0704)   PRN Meds: acetaminophen, ondansetron (ZOFRAN) IV, polyethylene glycol   Vital Signs    Vitals:   06/28/20 1954 06/29/20 0041 06/29/20 0611 06/29/20 0814  BP: 115/84 120/76 125/65 128/88  Pulse: 78 86 98 100  Resp: 20 20 16 18   Temp: 98.6 F (37 C) 98.3 F (36.8 C) 97.9 F (36.6 C) (!) 97.5 F (36.4 C)  TempSrc: Oral Oral Axillary Axillary  SpO2: 97% 98% 99% 93%  Weight:   91 kg   Height:        Intake/Output Summary (Last 24 hours) at 06/29/2020 0855 Last data filed at 06/29/2020 0537 Gross per 24 hour  Intake 415 ml  Output 1650 ml  Net -1235 ml   Filed Weights   06/26/20 1414 06/29/20 0611  Weight: 95.3 kg 91 kg    Telemetry    Afib - Personally Reviewed  ECG    Not performed - Personally Reviewed  Physical Exam  Physical Exam Constitutional:      Appearance: Normal appearance.  HENT:     Head: Normocephalic and atraumatic.  Eyes:     Extraocular Movements: Extraocular movements intact.  Cardiovascular:     Rate and Rhythm: Normal rate. Rhythm irregular.     Pulses: Normal pulses.     Heart sounds: Normal heart sounds.  Pulmonary:     Effort: Pulmonary effort is normal. No respiratory distress.     Breath sounds: Normal breath sounds. No  wheezing or rales.  Chest:     Chest wall: No tenderness.  Abdominal:     General: Abdomen is flat. There is no distension.     Tenderness: There is no abdominal tenderness.  Musculoskeletal:        General: No swelling or tenderness. Normal range of motion.  Skin:    General: Skin is warm and dry.  Neurological:     General: No focal deficit present.     Mental Status: She is alert and oriented to person, place, and time.  Psychiatric:        Mood and Affect: Mood normal.     Labs    Chemistry Recent Labs  Lab 06/26/20 1423 06/27/20 0209 06/28/20 0830  NA 141 139 140  K 3.6 3.4* 3.7  CL 113* 112* 108  CO2 16* 18* 22  GLUCOSE 125* 144* 113*  BUN 23 23 26*  CREATININE 1.31* 1.24* 1.28*  CALCIUM 9.2 8.8* 9.4  GFRNONAA 42* 45* 44*  ANIONGAP 12 9 10      Hematology Recent Labs  Lab 06/26/20 1423 06/27/20 0209  WBC 8.3 7.9  RBC 5.17* 4.99  HGB 14.2 13.8  HCT 44.7 43.4  MCV  86.5 87.0  MCH 27.5 27.7  MCHC 31.8 31.8  RDW 17.7* 17.6*  PLT 158 147*    Cardiac EnzymesNo results for input(s): TROPONINI in the last 168 hours. No results for input(s): TROPIPOC in the last 168 hours.   BNP Recent Labs  Lab 06/26/20 1439  BNP 927.9*     DDimer No results for input(s): DDIMER in the last 168 hours.   Radiology    No results found.  Cardiac Studies   Echocardiogram (06/29/2020)  1. Global hypokinesis with akinesis of the inferior and inferolateral  wall.   2. Left ventricular ejection fraction, by estimation, is 30 to 35%. The  left ventricle has moderate to severely decreased function. The left  ventricle demonstrates regional wall motion abnormalities (see scoring  diagram/findings for description). There  is moderate left ventricular hypertrophy. Left ventricular diastolic  function could not be evaluated.   3. Right ventricular systolic function is normal. The right ventricular  size is normal. There is normal pulmonary artery systolic pressure.   4.  Left atrial size was severely dilated.   5. Right atrial size was moderately dilated.   6. The mitral valve is normal in structure. Mild mitral valve  regurgitation. No evidence of mitral stenosis.   7. The aortic valve is tricuspid. Aortic valve regurgitation is trivial.  Mild to moderate aortic valve sclerosis/calcification is present, without  any evidence of aortic stenosis.   8. The inferior vena cava is dilated in size with >50% respiratory  variability, suggesting right atrial pressure of 8 mmHg.   Patient Profile     74 y.o. female with a history of CAD s/p CABG, HFpEF (EF 55-60% 11/2019), ICM, HTN, HL, CKD III, COPD, ongoing tob abuse, PAF on xarelto, prior GIB, and type II DM, who is being seen for the evaluation of Afib RVR and volume overlolad at the request of Dr. Lonny Prude.  Assessment & Plan    Atrial fibrillation with RVR - Patient's heart rate well controlled today on metoprolol XL 75 mg and Cardizem gtt. Will switch her to dixogin today and discontinue Cardizem. She is apparently not a great candidate for rhythm control strategy or ablation 2/2 to compliance. Patient restarted on Xerelto without any obvious sing of bleeding and stable Hgb counts. She can be followed up in the Op setting for further evaluation of her A fib.  - Continue Metoprolol XL 75 mg daily - Start Dixogin 0.0625 mg  - Continue Xerelto for [facility]  2.  Shortness of breath/HFrEF/COPD: -Elevated BNP.  Elevated JVD on admit with a new reduced ejection of fraction on recent Echo of 30-35% in the setting of Afib with RVR.  -she put out 1.2L overnight with a net negative 4.2 L today. -She admits to improved [facility] and appears euvolemic on exam. - Her shortness of breath is likely complicated by COPD as she has a history of smoking and PFTs from 2016 show  moderate airway obstruction as well. She will need follow up with pulmonology at discharge.  - She is on lasix 20 mg daily. The combination of medication  noncompliance and Afib with RVR like precipitated her decompensated heart failure. - Will likely be able to back to oral lasix 40 mg daily today with close OP follow up. - She would be a good candidate to start Entresto, but has had a reaction in the past. Therefore, she should continue losartan and titrate up in the OP setting.  - Will start Digoxin today for Afib and  HFrEF.  - Continue Metoprolol succinate  - Continue SGLT2 inhibitor in the Op setting.  - Consult pulmonology in the OP setting.   3.  Hyperchloremic non-anion gap metabolic acidosis - Resolved, likely secondary to HF.   4.  CAD status post CABG -Negative Myoview in 2020.   -Troponin is minimally elevated and flat c/w demand ischemia.  No concerns for ACS. -continue BB -no ASA due to DOAC   5. Constipation -PRN miralax  For questions or updates, please contact [place] Please consult [email or web address] for contact info under Cardiology/STEMI.      Signed, Marianna Payment, MD  06/29/2020, 8:55 AM   Pager: [phone]  Patient examined chart reviewed discussed care with patient and resident Exam with no distress poor air movement no active wheezing soft abdomen and no edema. Dig/Beta blocker and ARB for ischemic DCM No evidence of acute ischemic event this admission On DOAC for afib has seen EP and not a candidate for ablation or AAT due to f/u and compliance issues  Cardiology will sign off  Will arrange f/u with [name]  Jenkins Rouge MD [facility]

## 2020-06-29 NOTE — Progress Notes (Signed)
Student nurse ambulated pt in hallway from room to nurses station and back. Pt. Heart rate at rest 100 during ambulation heart rate went up to 120s. Pr reported shortness of breath requiring breaks before returning back to room. BP at rest 114/65. After ambulation 118/96.

## 2020-06-30 LAB — BASIC METABOLIC PANEL
Anion gap: 10 (ref 5–15)
BUN: 38 mg/dL — ABNORMAL HIGH (ref 8–23)
CO2: 23 mmol/L (ref 22–32)
Calcium: 9.3 mg/dL (ref 8.9–10.3)
Chloride: 106 mmol/L (ref 98–111)
Creatinine, Ser: 1.52 mg/dL — ABNORMAL HIGH (ref 0.44–1.00)
GFR, Estimated: 36 mL/min — ABNORMAL LOW (ref >60)
Glucose, Bld: 135 mg/dL — ABNORMAL HIGH (ref 70–99)
Potassium: 3.8 mmol/L (ref 3.5–5.1)
Sodium: 139 mmol/L (ref 135–145)

## 2020-06-30 LAB — GLUCOSE, CAPILLARY: Glucose-Capillary: 150 mg/dL — ABNORMAL HIGH (ref 70–99)

## 2020-06-30 MED ORDER — METOPROLOL SUCCINATE ER 100 MG PO TB24
100.0000 mg | ORAL_TABLET | Freq: Every day | ORAL | Status: DC
  Administered 2020-06-30: 100 mg via ORAL
  Filled 2020-06-30: qty 1

## 2020-06-30 MED ORDER — SIMETHICONE 80 MG PO CHEW
80.0000 mg | CHEWABLE_TABLET | Freq: Once | ORAL | Status: AC
  Administered 2020-06-30: 80 mg via ORAL

## 2020-06-30 MED ORDER — METOPROLOL SUCCINATE ER 100 MG PO TB24: 100.0000 mg | ORAL_TABLET | Freq: Every day | ORAL | 3 refills | Status: DC

## 2020-06-30 MED ORDER — FUROSEMIDE 40 MG PO TABS
40.0000 mg | ORAL_TABLET | Freq: Every day | ORAL | Status: DC
  Filled 2020-06-30: qty 1

## 2020-06-30 NOTE — Progress Notes (Signed)
PT Cancellation Note  Patient Details Name: [name] [name] MRN: [id number] DOB: October 10, 1945   Cancelled Treatment:    Reason Eval/Treat Not Completed: Other (comment). Pt for dc home. Spoke with PA and we do not need to evaluate pt.    Shary Decamp [facility] 06/30/2020, 9:06 AM [place] Pager [phone] Office [phone]

## 2020-06-30 NOTE — Progress Notes (Addendum)
   06/30/20 0354  Assess: MEWS Score  Temp 98.9 F (37.2 C)  BP 112/74  Pulse Rate (!) 118  ECG Heart Rate (!) 120  Resp 16  SpO2 98 %  O2 Device Nasal Cannula  Assess: MEWS Score  MEWS Temp 0  MEWS Systolic 0  MEWS Pulse 2  MEWS RR 0  MEWS LOC 0  MEWS Score 2  MEWS Score Color Yellow  Assess: if the MEWS score is Yellow or Red  Were vital signs taken at a resting state? Yes  Focused Assessment No change from prior assessment  Early Detection of Sepsis Score *See Row Information* Low  MEWS guidelines implemented *See Row Information* No, previously yellow, continue vital signs every 4 hours  Treat  MEWS Interventions Administered prn meds/treatments  Take Vital Signs  Increase Vital Sign Frequency  Yellow: Q 2hr X 2 then Q 4hr X 2, if remains yellow, continue Q 4hrs  Escalate  MEWS: Escalate Yellow: discuss with charge nurse/RN and consider discussing with provider and RRT  Notify: Charge Nurse/RN  Name of Charge Nurse/RN Notified Jill, RN  Date Charge Nurse/RN Notified 06/30/20  Time Charge Nurse/RN Notified [phone]

## 2020-06-30 NOTE — Progress Notes (Signed)
Progress Note  Patient Name: [name] [name] Date of Encounter: 06/30/2020  Primary Cardiologist: [name], MD   Subjective   Did not go home yesterday late in day lives alone Outreach contacted for support  At home and ready to be d/c today pending BMET   Inpatient Medications    Scheduled Meds: . atorvastatin  10 mg Oral QHS  . digoxin  0.0625 mg Oral Daily  . furosemide  40 mg Oral Daily  . insulin aspart  0-15 Units Subcutaneous TID WC  . losartan  25 mg Oral BID  . metoprolol succinate  100 mg Oral Daily  . rivaroxaban  20 mg Oral Q supper  . sodium bicarbonate  650 mg Oral BID  . spironolactone  25 mg Oral Daily   Continuous Infusions:  PRN Meds: acetaminophen, ondansetron (ZOFRAN) IV, polyethylene glycol   Vital Signs    Vitals:   06/30/20 0000 06/30/20 0354 06/30/20 0409 06/30/20 0456  BP: 122/82 112/74 120/70   Pulse: 98 (!) 118 98 98  Resp: 18 16 16 16   Temp: 97.6 F (36.4 C) 98.9 F (37.2 C) 98.9 F (37.2 C) 98.9 F (37.2 C)  TempSrc: Oral Oral    SpO2: 97% 98%  98%  Weight:      Height:        Intake/Output Summary (Last 24 hours) at 06/30/2020 0826 Last data filed at 06/30/2020 0600 Gross per 24 hour  Intake 576.9 ml  Output 1820 ml  Net -1243.1 ml   Filed Weights   06/26/20 1414 06/29/20 0611  Weight: 95.3 kg 91 kg    Telemetry    Afib - Personally Reviewed  ECG    Not performed - Personally Reviewed  Physical Exam  Physical Exam Constitutional:      Appearance: Normal appearance.  HENT:     Head: Normocephalic and atraumatic.  Eyes:     Extraocular Movements: Extraocular movements intact.  Cardiovascular:     Rate and Rhythm: Normal rate. Rhythm irregular.     Pulses: Normal pulses.     Heart sounds: Normal heart sounds.  Pulmonary:     Effort: Pulmonary effort is normal. No respiratory distress.     Breath sounds: Normal breath sounds. No wheezing or rales.  Chest:     Chest wall: No tenderness.  Abdominal:      General: Abdomen is flat. There is no distension.     Tenderness: There is no abdominal tenderness.  Musculoskeletal:        General: No swelling or tenderness. Normal range of motion.  Skin:    General: Skin is warm and dry.  Neurological:     General: No focal deficit present.     Mental Status: She is alert and oriented to person, place, and time.  Psychiatric:        Mood and Affect: Mood normal.     Labs    Chemistry Recent Labs  Lab 06/27/20 0209 06/28/20 0830 06/29/20 0952  NA 139 140 138  K 3.4* 3.7 3.7  CL 112* 108 104  CO2 18* 22 22  GLUCOSE 144* 113* 139*  BUN 23 26* 31*  CREATININE 1.24* 1.28* 1.47*  CALCIUM 8.8* 9.4 9.2  GFRNONAA 45* 44* 37*  ANIONGAP 9 10 12      Hematology Recent Labs  Lab 06/26/20 1423 06/27/20 0209 06/29/20 0952  WBC 8.3 7.9 10.0  RBC 5.17* 4.99 5.83*  HGB 14.2 13.8 15.6*  HCT 44.7 43.4 48.4*  MCV 86.5  87.0 83.0  MCH 27.5 27.7 26.8  MCHC 31.8 31.8 32.2  RDW 17.7* 17.6* 17.1*  PLT 158 147* 177    Cardiac EnzymesNo results for input(s): TROPONINI in the last 168 hours. No results for input(s): TROPIPOC in the last 168 hours.   BNP Recent Labs  Lab 06/26/20 1439  BNP 927.9*     DDimer No results for input(s): DDIMER in the last 168 hours.   Radiology    No results found.  Cardiac Studies   Echocardiogram (06/29/2020)  1. Global hypokinesis with akinesis of the inferior and inferolateral  wall.   2. Left ventricular ejection fraction, by estimation, is 30 to 35%. The  left ventricle has moderate to severely decreased function. The left  ventricle demonstrates regional wall motion abnormalities (see scoring  diagram/findings for description). There  is moderate left ventricular hypertrophy. Left ventricular diastolic  function could not be evaluated.   3. Right ventricular systolic function is normal. The right ventricular  size is normal. There is normal pulmonary artery systolic pressure.   4. Left atrial  size was severely dilated.   5. Right atrial size was moderately dilated.   6. The mitral valve is normal in structure. Mild mitral valve  regurgitation. No evidence of mitral stenosis.   7. The aortic valve is tricuspid. Aortic valve regurgitation is trivial.  Mild to moderate aortic valve sclerosis/calcification is present, without  any evidence of aortic stenosis.   8. The inferior vena cava is dilated in size with >50% respiratory  variability, suggesting right atrial pressure of 8 mmHg.   Patient Profile     74 y.o. female with a history of CAD s/p CABG, HFpEF (EF 55-60% 11/2019), ICM, HTN, HL, CKD III, COPD, ongoing tob abuse, PAF on xarelto, prior GIB, and type II DM, who is being seen for the evaluation of Afib RVR and volume overlolad    Assessment & Plan    1. Atrial fibrillation with RVR - reasonable control at rest  - Increase Toprol to 100 mg daily  -  Dixogin 0.0625 mg  - Continue Xerelto for [facility]  2.  Shortness of breath/HFrEF/COPD: -CHF EF 30-35% good diuresis BMET pending if Cr up can hold lasix for a day Continue current Rx unable To take entresto ? Side effect listed as allergy    3.  Hyperchloremic non-anion gap metabolic acidosis - Resolved, likely secondary to HF.   4.  CAD status post CABG -Negative Myoview in 2020.   -Troponin is minimally elevated and flat c/w demand ischemia.  No concerns for ACS. -continue BB -no ASA due to DOAC   5. Constipation -PRN miralax  D/c home outpatient f/u skains and pulmonary For questions or updates, please contact [facility] Please consult [email or web address] for contact info under Cardiology/STEMI.      Signed, Jenkins Rouge, MD  06/30/2020, 8:26 AM   Pager: [phone]

## 2020-06-30 NOTE — Progress Notes (Signed)
OT Cancellation Note  Patient Details Name: [name] [name] MRN: [id number] DOB: 11-27-1945   Cancelled Treatment:    Reason Eval/Treat Not Completed: OT screened, no needs identified, will sign off (Pt being discharged at this time. No need for OT eval.)   Jefferey Pica, OTR/L Acute Rehabilitation Services Pager: [phone] Office: [phone]   Jerene Pitch 06/30/2020, 9:33 AM

## 2020-06-30 NOTE — Progress Notes (Signed)
Pt received discharge instructions. Pt is educated about her medications and the importance to take them as prescribed. Pt is encouraged to follow up with scheduled appointment. Pt does not have any questions or concerns at this time. Pt is ready for discharge. RN and MD are aware about pt's slightly elevated HR in 110s and ok to proceed with discharge.

## 2020-07-01 ENCOUNTER — Other Ambulatory Visit: Payer: Self-pay

## 2020-07-01 NOTE — Telephone Encounter (Signed)
Please let the patient know that her labs look stable from prior lab work. Kidney function actually improved slightly. Platelets remain on the low side. Needs follow up with PCP.

## 2020-07-01 NOTE — Telephone Encounter (Signed)
**Note De-Identified  Obfuscation** Transition Care Management Unsuccessful Follow-up Telephone Call  Date of discharge and from where: 06/30/2020 from [facility]  Attempts:  1st attempt  Reason for unsuccessful TCM follow-up call: No answer at the pts cell phone so I left a message asking the pt to call [name] [name] back at [phone] at [name] office at [facility].  I then called her home number and the pt answered the phone. I explained who I am and why I am calling and she stated that she did have time to talk with me. She verified her d/c date (11/16 yesterday) from [name] and her f/u appt with Richardson Dopp on 07/08/2020 at 2:15 at [address] in [place] and then asked me to hold on. I did hold for more than 5 mins and she did not return.  I will call her again tomorrow.

## 2020-07-01 NOTE — Patient Outreach (Signed)
  [facility]) Care Management Chronic Special Needs Program    07/01/2020  Name: [name] [name], [name] [name]: Dec 15, 1945  MRN: [id number]   [name] [name] is enrolled in a chronic special needs plan for Heart Failure. Client admitted 06/26/20 with atrial fibrillation. Discharged to home on 06/30/20.   Goals Addressed            This Visit's Progress   . General - Client will not be readmitted within 30 days (C-SNP) discharge date revised 06/30/20   On track    Please follow discharge instructions and call provider if you have any questions. Please attend all follow up appointments as scheduled. Please take your medications as prescribed. Please call 24 hour nurse advice line as needed. [phone]).         Plan: [facility] in process of making outreaches to engage client. RNCM updated [facility] of discharge. Care plan updated and sent to client and sent to primary care provider.   Thea Silversmith, RN, MSN, [place] [phone]

## 2020-07-02 NOTE — Telephone Encounter (Signed)
**Note De-Identified  Obfuscation** Transition Care Management Unsuccessful Follow-up Telephone Call  Date of discharge and from where: 06/30/2020 from [facility]  Attempts: 2nd  Reason for unsuccessful TCM follow-up call: The pts home phone was busy X3 and no answer at the pts cell phone so I left a message asking her to call Jeani Hawking back at [phone] at [name] office at [facility].

## 2020-07-03 NOTE — Telephone Encounter (Signed)
Left the pt a message to call the office back.  She did confirm with the operator in prior message that she would be attending her upcoming TOC appt scheduled for 11/24 at 2:15 pm with Richardson Dopp PA-C.  This call back was to touch base with TOC follow-up questions for pts recent hospital discharge on 11/16.

## 2020-07-03 NOTE — Telephone Encounter (Signed)
Patient returning call. She states she will be at her appointment 07/08/2020 at 2:15 pm.

## 2020-07-03 NOTE — Telephone Encounter (Addendum)
**Note De-Identified  Obfuscation** Transition Care Management Unsuccessful Follow-up Telephone Call  Date of discharge and from where: 06/30/2020  Attempts:  3 times  Reason for unsuccessful TCM follow-up call:No answer so I left a detailed message (ok per DPR) on her VM asking her to call Jeani Hawking back at [phone] at Dr Kingsley Plan office at [facility]. I did leave a reminder in the VM message that she has a post hospital f/u scheduled with Richardson Dopp, PA-c on 11/24 @ 2:15 @ [address], [address] in [place],  [address]. She and I were able to discuss this f/u appt when we spoke on 11/17 and she verbalized awareness of this appt..  I also advised in the VM message that if she has any questions or concerns to call us back at [phone].

## 2020-07-08 ENCOUNTER — Encounter: Payer: Self-pay | Admitting: Physician Assistant

## 2020-07-08 ENCOUNTER — Ambulatory Visit: Payer: HMO | Admitting: Physician Assistant

## 2020-07-08 ENCOUNTER — Other Ambulatory Visit: Payer: Self-pay

## 2020-07-08 VITALS — BP 140/78 | HR 110 | Ht 67.5 in | Wt 206.8 lb

## 2020-07-08 DIAGNOSIS — N1832 Chronic kidney disease, stage 3b: Secondary | ICD-10-CM

## 2020-07-08 DIAGNOSIS — I4819 Other persistent atrial fibrillation: Secondary | ICD-10-CM | POA: Diagnosis not present

## 2020-07-08 DIAGNOSIS — I25810 Atherosclerosis of coronary artery bypass graft(s) without angina pectoris: Secondary | ICD-10-CM | POA: Diagnosis not present

## 2020-07-08 DIAGNOSIS — I480 Paroxysmal atrial fibrillation: Secondary | ICD-10-CM | POA: Diagnosis not present

## 2020-07-08 DIAGNOSIS — J449 Chronic obstructive pulmonary disease, unspecified: Secondary | ICD-10-CM | POA: Diagnosis not present

## 2020-07-08 DIAGNOSIS — I5020 Unspecified systolic (congestive) heart failure: Secondary | ICD-10-CM

## 2020-07-08 MED ORDER — METOPROLOL SUCCINATE ER 100 MG PO TB24
ORAL_TABLET | ORAL | 3 refills | Status: DC
Start: 2020-07-08 — End: 2020-07-31

## 2020-07-08 NOTE — Patient Instructions (Signed)
Medication Instructions:  Your physician has recommended you make the following change in your medication:   1) Increase Metoprolol to 1 tablet by mouth in the morning, and 0.5 tablet by mouth 12 hours later  *If you need a refill on your cardiac medications before your next appointment, please call your pharmacy*  Lab Work: You will have labs drawn today: BMET  If you have labs (blood work) drawn today and your tests are completely normal, you will receive your results only by: Marland Kitchen MyChart Message (if you have MyChart) OR . A paper copy in the mail If you have any lab test that is abnormal or we need to change your treatment, we will call you to review the results.  Testing/Procedures: None ordered today  Follow-Up: On 07/23/20 at 4:00PM with [name], MD

## 2020-07-08 NOTE — Progress Notes (Signed)
Cardiology Office Note:    Date:  07/08/2020   ID:  JNAI SNELLGROVE, DOB [birth year], MRN [id number]  PCP:  [name], MD  [facility] Cardiologist:  [name], MD   [facility] Electrophysiologist:  None  Advanced Heart Failure Clinic:  None    Referring MD: [name], MD   Chief Complaint:  Hospitalization Follow-up (acute CHF, AFib w RVR)    Patient Profile:    [name] [name] is a 74 y.o. female with:   Coronary artery disease   S/p CABG in 2016  Myoview 08/2018: low risk   Heart failure with reduced ejection fraction   Previously followed in AHF Clinic >> released in 11/2019  Combined Ischemic/nonischemic CM  Intol of Entresto due to chest pain   Echo 3/16: EF 35-40  Echo 10/17: EF 50-55  Echo 5/18: EF 50-55  Echo 3/19: EF 30-35  Echo 12/19 8: EF 30-35  Echo 4/21: EF 60  Echo 11/21: EF 30-35  Paroxysmal atrial fibrillation   S/p TEE-DCCV in 5/19 >> ERAF  Hypertension   Hyperlipidemia   Hx of GI bleed (duodenal ulcer) 10/2019  Chronic kidney disease   COPD  Prior CV studies: Echocardiogram 06/27/2020 Inferior and inferolateral HK, EF 30-35, moderate LVH, normal RVSF, severe LAE, moderate RAE, mild MR, trivial AI, mild to moderate aortic valve sclerosis without stenosis  Carotid US 12/11/2019 Bilateral ICA 1-39  Myoview 09/07/2018 EF 44, normal perfusion, low risk   History of Present Illness:    [name] [name] was last seen in clinic by [name] Drown, NP 06/16/2020 after a trip to the ED with leg edema.    She was admitted 11/12-11/16 with decompensated heart failure in the setting of atrial fibrillation with rapid ventricular rate.  Echocardiogram demonstrated worsening LV function with an EF of 30-35.  She was taken off of diltiazem and her beta-blocker was increased.  She was also placed on digoxin.  She is not felt to be a candidate for rhythm control or ablation due to prior history of nonadherence as well as prior GI bleeding.   She had mildly elevated troponin levels consistent with demand ischemia.  She was also referred to Pulmonology for further management of COPD.     She returns for f/u.  She is here alone. She feels her breathing has improved. She has not had chest pain.  She has not had orthopnea, leg edema, syncope.  She has not had any melena, hematochezia or hematuria.        Past Medical History:  Diagnosis Date  . (HFimpEF) heart failure with improved ejection fraction ([place])    a. 07/2018 Echo: EF 30-35%; b. 11/2019 Echo: EF 55-60%, no rwma, Gr2 DD, Nl RV size/fxn. Mild BAE. Mild MR/AI.  Marland Kitchen Arthritis   . Asthma    ??  . Atopic dermatitis   . CAD (coronary artery disease)    a. 2016 s/p CABG x 2 (LIMA->LAD, VG->OM); b. 08/2018 MV: EF 44%, no ischemia/infact.  . Cardiac arrest ([place]) 10/2014  . Cardiomyopathy, ischemic    a. 07/2018 Echo: EF 30-35%; 11/2019 Echo: EF 55-60%.  . Carotid arterial disease ([place])    a. 11/2019 Carotid U/S  . CKD (chronic kidney disease), stage III ([place])   . COPD (chronic obstructive pulmonary disease) ([place])   . Esophageal dilatation 2013  . GERD (gastroesophageal reflux disease)   . Gout   . Headache   . Hypertension   . Hypokalemia   . Idiopathic angioedema   .  LGI bleed 08/06/2017   a. felt to be hemorrhoidal during that admission (no drop in Hgb).  . Lower back pain   . Paroxysmal atrial fibrillation ([place])    a. Dx 2016-->h/o difficult to control rates (complicated by noncompliance), not felt to be a candidate for ablation or antiarrhythmic due to noncompliance; b. Recurrent AF 2021 - converted w/ IV dilt; c. CHA2DS2VASc = 7-->Xarelto.  . Personal history of noncompliance with medical treatment, presenting hazards to health   . Prediabetes   . S/P CABG x 2 with clipping of LA appendage 11/14/2014   LIMA to LAD, SVG to OM, EVH via right thigh  . Urine incontinence   . Uterine fibroid     Current Medications: Current Meds  Medication Sig  . acetaminophen (TYLENOL)  500 MG tablet Take 500 mg by mouth every 6 (six) hours as needed for moderate pain.   Marland Kitchen allopurinol (ZYLOPRIM) 300 MG tablet Take 1 tablet (300 mg total) by mouth daily.  Marland Kitchen atorvastatin (LIPITOR) 10 MG tablet Take 5-10 mg by mouth at bedtime.   . Coenzyme Q10 (CO Q-10 PO) Take 1 capsule by mouth daily.   . digoxin 62.5 MCG TABS Take 0.0625 mg by mouth daily.  . Empagliflozin (JARDIANCE PO) Take 0.5 tablets by mouth.   . famotidine (PEPCID) 20 MG tablet Take 20 mg by mouth daily.  . furosemide (LASIX) 20 MG tablet Take 2 tablets (40 mg total) by mouth daily.  Marland Kitchen losartan (COZAAR) 25 MG tablet Take 1 tablet (25 mg total) by mouth 2 (two) times daily.  . metoprolol succinate (TOPROL-XL) 100 MG 24 hr tablet Take 1 tablet by mouth in the morning, take 0.5 tablet by mouth 12 hours later  . pantoprazole (PROTONIX) 40 MG tablet Take 40 mg by mouth as needed.  . rivaroxaban (XARELTO) 20 MG TABS tablet Take 1 tablet (20 mg total) by mouth daily with supper. Resume from 11/01/2019  . spironolactone (ALDACTONE) 25 MG tablet Take 1 tablet (25 mg total) by mouth daily.  . [DISCONTINUED] metoprolol succinate (TOPROL-XL) 100 MG 24 hr tablet Take 1 tablet (100 mg total) by mouth daily. Take with or immediately following a meal.     Allergies:   Bee venom, Ivp dye [iodinated diagnostic agents], Codeine, Entresto [sacubitril-valsartan], and Shrimp [shellfish allergy]   Social History   Tobacco Use  . Smoking status: Current Some Day Smoker    Packs/day: 0.10    Years: 56.00    Pack years: 5.60    Types: Cigarettes  . Smokeless tobacco: Never Used  . Tobacco comment: "I might still take a puff now and then"  Vaping Use  . Vaping Use: Never used  Substance Use Topics  . Alcohol use: No    Alcohol/week: 0.0 standard drinks  . Drug use: No     Family Hx: [name] family history includes CVA in her sister; Cancer in her mother; Diabetes in her brother; Heart disease in her father; Other in her  father; Prostate cancer (age of onset: 53) in her brother.  ROS   EKGs/Labs/Other Test Reviewed:    EKG:  EKG is   ordered today.  The ekg ordered today demonstrates atrial fibrillation, HR 110, inf-lat TW inversions, QTc 427 ms  Recent Labs: 10/29/2019: Magnesium 1.9 02/10/2020: ALT 11 06/26/2020: B Natriuretic Peptide 927.9 06/29/2020: Hemoglobin 15.6; Platelets 177 06/30/2020: BUN 38; Creatinine, Ser 1.52; Potassium 3.8; Sodium 139   Recent Lipid Panel Lab Results  Component Value Date/Time   CHOL  116 11/12/2014 04:32 AM   TRIG 111 11/12/2014 04:32 AM   HDL 34 (L) 11/12/2014 04:32 AM   CHOLHDL 3.4 11/12/2014 04:32 AM   LDLCALC 60 11/12/2014 04:32 AM      Risk Assessment/Calculations:     CHA2DS2-VASc Score = 6  This indicates a 9.7% annual risk of stroke. [name] score is based upon: CHF History: 1 HTN History: 1 Diabetes History: 0 Stroke History: 0 Vascular Disease History: 1 Age Score: 2 Gender Score: 1     Physical Exam:    VS:  BP 140/78   Pulse (!) 110   Ht 5' 7.5" (1.715 m)   Wt 206 lb 12.8 oz (93.8 kg)   SpO2 99%   BMI 31.91 kg/m     Wt Readings from Last 3 Encounters:  07/08/20 206 lb 12.8 oz (93.8 kg)  06/29/20 200 lb 11.2 oz (91 kg)  06/16/20 210 lb (95.3 kg)     Constitutional:      Appearance: Healthy appearance. Not in distress.  Neck:     Vascular: JVD normal.  Pulmonary:     Effort: Pulmonary effort is normal.     Breath sounds: No wheezing. No rales.  Cardiovascular:     Normal rate. Irregularly irregular rhythm. Normal S1. Normal S2.     Murmurs: There is no murmur.  Edema:    Peripheral edema absent.  Abdominal:     Palpations: Abdomen is soft.  Skin:    General: Skin is warm and dry.  Neurological:     Mental Status: Alert and oriented to person, place and time.     Cranial Nerves: Cranial nerves are intact.       ASSESSMENT & PLAN:    1. Persistent atrial fibrillation ([place]) She remains in AFib with RVR.   Overall, she feels better.  The notes from her admission indicate that she had not been taking some of her medications correctly.  She has been out of the hospital for a week now.  I will increase her Metoprolol succinate to 100 mg in the AM and 50 mg in the PM for better rate control.  Continue Digoxin, Rivaroxaban.  We discussed the importance of uninterrupted anticoagulation prior to proceeding with DCCV.  I reviewed her case with [name].  We will bring her back in f/u in the next 2-3 weeks.  If she remains in AFib at that time, we can consider proceeding with DCCV.    2. HFrEF (heart failure with reduced ejection fraction) (HCC) EF has been variable in the past.  It seems that her EF is down when she is in AFib (tachycardia mediated).  Volume status is stable. NYHA II-IIb.  Continue Digoxin, Emplagliflozin, Furosemide, Losartan, Metoprolol succinate, spironolactone.  BMET today.   3. Coronary artery disease involving coronary bypass graft of native heart without angina pectoris S/p CABG in 2016.  Myoview in 2020 was low risk.  She is doing well without angina.  Continue atorvastatin, beta-blocker.  She is not on ASA as she is on Rivaroxaban.    4. Stage 3b chronic kidney disease ([place]) Creatinine increased to 1.52 in the hospital.  Will repeat BMET today.   5. Chronic obstructive pulmonary disease, unspecified COPD type ([place]) She has a referral to Pulmonology pending.      Dispo:  Return in about 2 weeks (around 07/22/2020) for Close Follow Up w/ [name], or PA/NP, in person.   Medication Adjustments/Labs and Tests Ordered: Current medicines are reviewed at length  with the patient today.  Concerns regarding medicines are outlined above.  Tests Ordered: Orders Placed This Encounter  Procedures  . Basic metabolic panel  . EKG 12-Lead   Medication Changes: Meds ordered this encounter  Medications  . metoprolol succinate (TOPROL-XL) 100 MG 24 hr tablet    Sig: Take 1 tablet by  mouth in the morning, take 0.5 tablet by mouth 12 hours later    Dispense:  135 tablet    Refill:  3    Signed, Richardson Dopp, PA-C  07/08/2020 3:21 PM    [facility] [address], [place], [place]  [address] Phone: [phone]; Fax: [phone]

## 2020-07-09 LAB — BASIC METABOLIC PANEL
BUN/Creatinine Ratio: 18 (ref 12–28)
BUN: 26 mg/dL (ref 8–27)
CO2: 21 mmol/L (ref 20–29)
Calcium: 9.5 mg/dL (ref 8.7–10.3)
Chloride: 110 mmol/L — ABNORMAL HIGH (ref 96–106)
Creatinine, Ser: 1.42 mg/dL — ABNORMAL HIGH (ref 0.57–1.00)
GFR calc Af Amer: 42 mL/min/{1.73_m2} — ABNORMAL LOW (ref >59)
GFR calc non Af Amer: 36 mL/min/{1.73_m2} — ABNORMAL LOW (ref >59)
Glucose: 101 mg/dL — ABNORMAL HIGH (ref 65–99)
Potassium: 4.3 mmol/L (ref 3.5–5.2)
Sodium: 144 mmol/L (ref 134–144)

## 2020-07-13 ENCOUNTER — Telehealth: Payer: Self-pay

## 2020-07-13 DIAGNOSIS — Z79899 Other long term (current) drug therapy: Secondary | ICD-10-CM

## 2020-07-13 NOTE — Telephone Encounter (Signed)
The patient has been notified of the result and verbalized understanding.  All questions (if any) were answered.  Patient has upcoming appointment with [name] on 07/23/20. Lab orders placed and patient is aware to come in early for blood work.  Wilma Flavin, RN 07/13/2020 8:45 AM

## 2020-07-13 NOTE — Telephone Encounter (Signed)
-----   Message from Liliane Shi, [place] sent at 07/12/2020  8:11 PM EST ----- Creatinine improved/stable.  K+ normal.   We will need to keep a close eye on her renal function.  If it stays where it is at, we will need to reduce the dose of Rivaroxaban (Xarelto).   PLAN:  - Continue current medications at present dosages.   - Repeat BMET 2 weeks.  Richardson Dopp, PA-C    07/12/2020 7:58 PM

## 2020-07-14 DIAGNOSIS — N183 Chronic kidney disease, stage 3 unspecified: Secondary | ICD-10-CM | POA: Diagnosis not present

## 2020-07-14 DIAGNOSIS — I509 Heart failure, unspecified: Secondary | ICD-10-CM | POA: Diagnosis not present

## 2020-07-14 DIAGNOSIS — M1990 Unspecified osteoarthritis, unspecified site: Secondary | ICD-10-CM | POA: Diagnosis not present

## 2020-07-14 DIAGNOSIS — I4891 Unspecified atrial fibrillation: Secondary | ICD-10-CM | POA: Diagnosis not present

## 2020-07-14 DIAGNOSIS — I5020 Unspecified systolic (congestive) heart failure: Secondary | ICD-10-CM | POA: Diagnosis not present

## 2020-07-14 DIAGNOSIS — I25810 Atherosclerosis of coronary artery bypass graft(s) without angina pectoris: Secondary | ICD-10-CM | POA: Diagnosis not present

## 2020-07-14 DIAGNOSIS — J449 Chronic obstructive pulmonary disease, unspecified: Secondary | ICD-10-CM | POA: Diagnosis not present

## 2020-07-14 DIAGNOSIS — I10 Essential (primary) hypertension: Secondary | ICD-10-CM | POA: Diagnosis not present

## 2020-07-14 DIAGNOSIS — I214 Non-ST elevation (NSTEMI) myocardial infarction: Secondary | ICD-10-CM | POA: Diagnosis not present

## 2020-07-14 DIAGNOSIS — E7800 Pure hypercholesterolemia, unspecified: Secondary | ICD-10-CM | POA: Diagnosis not present

## 2020-07-14 DIAGNOSIS — H269 Unspecified cataract: Secondary | ICD-10-CM | POA: Diagnosis not present

## 2020-07-14 DIAGNOSIS — E1122 Type 2 diabetes mellitus with diabetic chronic kidney disease: Secondary | ICD-10-CM | POA: Diagnosis not present

## 2020-07-17 ENCOUNTER — Telehealth: Payer: Self-pay | Admitting: Cardiology

## 2020-07-17 NOTE — Telephone Encounter (Signed)
Patient calling the office for samples of medication:   1.  What medication and dosage are you requesting samples for? rivaroxaban (XARELTO) 20 MG TABS tablet  2.  Are you currently out of this medication? Yes   

## 2020-07-17 NOTE — Telephone Encounter (Signed)
Returned call to pt and she has been made aware that we have set 14 days of Xarelto 20 mg tablets for her to pick up at the front desk.

## 2020-07-23 ENCOUNTER — Ambulatory Visit: Payer: HMO | Admitting: Cardiology

## 2020-07-23 ENCOUNTER — Other Ambulatory Visit: Payer: Self-pay

## 2020-07-23 ENCOUNTER — Other Ambulatory Visit: Payer: HMO

## 2020-07-23 ENCOUNTER — Encounter: Payer: Self-pay | Admitting: Cardiology

## 2020-07-23 VITALS — BP 120/70 | HR 97 | Ht 67.5 in | Wt 204.0 lb

## 2020-07-23 DIAGNOSIS — Z79899 Other long term (current) drug therapy: Secondary | ICD-10-CM

## 2020-07-23 DIAGNOSIS — I4819 Other persistent atrial fibrillation: Secondary | ICD-10-CM

## 2020-07-23 DIAGNOSIS — I25810 Atherosclerosis of coronary artery bypass graft(s) without angina pectoris: Secondary | ICD-10-CM | POA: Diagnosis not present

## 2020-07-23 NOTE — Patient Instructions (Addendum)
Medication Instructions:  Please hold your Jardiance. Continue all other mediations as listed.  *If you need a refill on your cardiac medications before your next appointment, please call your pharmacy*  Follow-Up: At [facility], you and your health needs are our priority.  As part of our continuing mission to provide you with exceptional heart care, we have created designated Provider Care Teams.  These Care Teams include your primary Cardiologist (physician) and Advanced Practice Providers (APPs -  Physician Assistants and Nurse Practitioners) who all work together to provide you with the care you need, when you need it.  We recommend signing up for the patient portal called "MyChart".  Sign up information is provided on this After Visit Summary.  MyChart is used to connect with patients for Virtual Visits (Telemedicine).  Patients are able to view lab/test results, encounter notes, upcoming appointments, etc.  Non-urgent messages can be sent to your provider as well.   To learn more about what you can do with MyChart, go to [email or web address].    Your next appointment:   4 month(s)  The format for your next appointment:   In Person  Provider:   [name], MD  Thank you for choosing [facility]!!

## 2020-07-23 NOTE — Progress Notes (Signed)
Cardiology Office Note:    Date:  07/23/2020   ID:  BRIT CARBONELL, [name] [name] 06-29-1946, MRN [id number]  PCP:  [name], MD  [facility] Cardiologist:  [name], MD  [facility] Electrophysiologist:  None   Referring MD: [name], MD     History of Present Illness:    [name] [name] is a 74 y.o. female here for the follow-up of persistent atrial fibrillation.  CAD cardiomyopathy    Please see below for details.  Past Medical History:  Diagnosis Date  . (HFimpEF) heart failure with improved ejection fraction ([name] [name])    a. 07/2018 Echo: EF 30-35%; b. 11/2019 Echo: EF 55-60%, no rwma, Gr2 DD, Nl RV size/fxn. Mild BAE. Mild MR/AI.  [name] [name] Arthritis   . Asthma    ??  . Atopic dermatitis   . CAD (coronary artery disease)    a. 2016 s/p CABG x 2 (LIMA->LAD, VG->OM); b. 08/2018 MV: EF 44%, no ischemia/infact.  . Cardiac arrest ([name] [name]) 10/2014  . Cardiomyopathy, ischemic    a. 07/2018 Echo: EF 30-35%; 11/2019 Echo: EF 55-60%.  . Carotid arterial disease ([name] [name])    a. 11/2019 Carotid U/S  . CKD (chronic kidney disease), stage III ([name] [name])   . COPD (chronic obstructive pulmonary disease) ([name] [name])   . Esophageal dilatation 2013  . GERD (gastroesophageal reflux disease)   . Gout   . Headache   . Hypertension   . Hypokalemia   . Idiopathic angioedema   . LGI bleed 08/06/2017   a. felt to be hemorrhoidal during that admission (no drop in Hgb).  . Lower back pain   . Paroxysmal atrial fibrillation ([name] [name])    a. Dx 2016-->h/o difficult to control rates (complicated by noncompliance), not felt to be a candidate for ablation or antiarrhythmic due to noncompliance; b. Recurrent AF 2021 - converted w/ IV dilt; c. CHA2DS2VASc = 7-->Xarelto.  . Personal history of noncompliance with medical treatment, presenting hazards to health   . Prediabetes   . S/P CABG x 2 with clipping of LA appendage 11/14/2014   LIMA to LAD, SVG to OM, EVH via right thigh  . Urine incontinence   . Uterine fibroid      Past Surgical History:  Procedure Laterality Date  . BALLOON DILATION N/A 10/10/2019   Procedure: BALLOON DILATION;  Surgeon: Otis Brace, MD;  [facility];  Service: Gastroenterology;  Laterality: N/A;  . BIOPSY  10/10/2019   Procedure: BIOPSY;  Surgeon: Otis Brace, MD;  Location: [place];  Service: Gastroenterology;;  . BIOPSY  10/11/2019   Procedure: BIOPSY;  Surgeon: Otis Brace, MD;  Location: [place];  Service: Gastroenterology;;  . CARDIOVERSION N/A 11/18/2014   Procedure: CARDIOVERSION;  Surgeon: [name], MD;  Location: [name];  Service: Cardiovascular;  Laterality: N/A;  . CARDIOVERSION N/A 12/25/2017   Procedure: CARDIOVERSION;  Surgeon: [name], MD;  Location: [place];  Service: Cardiovascular;  Laterality: N/A;  . CLIPPING OF ATRIAL APPENDAGE N/A 11/14/2014   Procedure: CLIPPING OF ATRIAL APPENDAGE;  Surgeon: [name], MD;  Location: [name];  Service: Open Heart Surgery;  Laterality: N/A;  . COLONOSCOPY  2013  . COLONOSCOPY WITH PROPOFOL N/A 10/11/2019   Procedure: COLONOSCOPY WITH PROPOFOL;  Surgeon: Otis Brace, MD;  Location: [place];  Service: Gastroenterology;  Laterality: N/A;  . CORONARY ARTERY BYPASS GRAFT N/A 11/14/2014   Procedure: CORONARY ARTERY BYPASS GRAFTING (CABG)TIMES 2 USING LEFT INTERNAL MAMMARY ARTERY AND RIGHT SAPHENOUS VEIN HARVESTED ENDOSCOPICALLY;  Surgeon:  [name], MD;  Location: [place];  Service: Open Heart Surgery;  Laterality: N/A;  . ESOPHAGOGASTRODUODENOSCOPY (EGD) WITH PROPOFOL N/A 10/10/2019   Procedure: ESOPHAGOGASTRODUODENOSCOPY (EGD) WITH PROPOFOL;  Surgeon: Otis Brace, MD;  [facility];  Service: Gastroenterology;  Laterality: N/A;  . ESOPHAGOGASTRODUODENOSCOPY (EGD) WITH PROPOFOL N/A 10/27/2019   Procedure: ESOPHAGOGASTRODUODENOSCOPY (EGD) WITH PROPOFOL;  Surgeon: Ronnette Juniper, MD;  Location: [name];  Service: Gastroenterology;  Laterality:  N/A;  . GIVENS CAPSULE STUDY N/A 10/27/2019   Procedure: GIVENS CAPSULE STUDY;  Surgeon: Ronnette Juniper, MD;  Location: [name];  Service: Gastroenterology;  Laterality: N/A;  . LEFT HEART CATHETERIZATION WITH CORONARY ANGIOGRAM N/A 11/04/2014   Procedure: LEFT HEART CATHETERIZATION WITH CORONARY ANGIOGRAM;  Surgeon: Troy Sine, MD;  [facility]: [name];  Service: Cardiovascular;  Laterality: N/A;  . POLYPECTOMY  10/11/2019   Procedure: POLYPECTOMY;  Surgeon: Otis Brace, MD;  Location: [place] [facility]OSCOPY;  Service: Gastroenterology;;  . TEE WITHOUT CARDIOVERSION N/A 11/14/2014   Procedure: TRANSESOPHAGEAL ECHOCARDIOGRAM (TEE);  Surgeon: [name], MD;  Location: [name];  Service: Open Heart Surgery;  Laterality: N/A;  . TEE WITHOUT CARDIOVERSION N/A 12/25/2017   Procedure: TRANSESOPHAGEAL ECHOCARDIOGRAM (TEE);  Surgeon: [name], MD;  Location: [name];  Service: Cardiovascular;  Laterality: N/A;  . TEMPORARY PACEMAKER INSERTION  11/04/2014   Procedure: TEMPORARY PACEMAKER INSERTION;  Surgeon: Troy Sine, MD;  Location: [name];  Service: Cardiovascular;;  . TOOTH EXTRACTION      Current Medications: Current Meds  Medication Sig  . acetaminophen (TYLENOL) 500 MG tablet Take 500 mg by mouth every 6 (six) hours as needed for moderate pain.   [name] [name] allopurinol (ZYLOPRIM) 300 MG tablet Take 1 tablet (300 mg total) by mouth daily.  [name] [name] atorvastatin (LIPITOR) 10 MG tablet Take 5-10 mg by mouth at bedtime.   . Coenzyme Q10 (CO Q-10 PO) Take 1 capsule by mouth daily.   . digoxin 62.5 MCG TABS Take 0.0625 mg by mouth daily.  . Empagliflozin (JARDIANCE PO) Take 0.5 tablets by mouth.   . famotidine (PEPCID) 20 MG tablet Take 20 mg by mouth daily.  . furosemide (LASIX) 20 MG tablet Take 2 tablets (40 mg total) by mouth daily.  [name] [name] losartan (COZAAR) 25 MG tablet Take 1 tablet (25 mg total) by mouth 2 (two) times daily.  . metoprolol succinate (TOPROL-XL) 100 MG 24 hr tablet Take  1 tablet by mouth in the morning, take 0.5 tablet by mouth 12 hours later  . pantoprazole (PROTONIX) 40 MG tablet Take 40 mg by mouth as needed.  . rivaroxaban (XARELTO) 20 MG TABS tablet Take 1 tablet (20 mg total) by mouth daily with supper. Resume from 11/01/2019  . spironolactone (ALDACTONE) 25 MG tablet Take 1 tablet (25 mg total) by mouth daily.     Allergies:   Bee venom, Ivp dye [iodinated diagnostic agents], Codeine, Entresto [sacubitril-valsartan], and Shrimp [shellfish allergy]   Social History   Socioeconomic History  . Marital status: Widowed    Spouse name: Not on file  . Number of children: 1  . Years of education: 4  . Highest education level: Not on file  Occupational History  . Occupation: RETIRED [employer]  Tobacco Use  . Smoking status: Current Some Day Smoker    Packs/day: 0.10    Years: 56.00    Pack years: 5.60    Types: Cigarettes  . Smokeless tobacco: Never Used  . Tobacco comment: "I might still take  a puff now and then"  Vaping Use  . Vaping Use: Never used  Substance and Sexual Activity  . Alcohol use: No    Alcohol/week: 0.0 standard drinks  . Drug use: No  . Sexual activity: Not on file  Other Topics Concern  . Not on file  Social History Narrative   Patient reports it being difficult to pay for everything due to outstanding medical bills from hospital stay last year but states she is able to keep up with basic expenses.  Patient does have some concerns with her house's condition due to a water leak- had her roof repaired last month but has some leaking in the front which has affected her porch.  Patient owns her own car and is able to drive herself but has some concerns about the reliability of her car- has gotten taxi to the clinic for appointment in the past when her car broke down.   Social Determinants of Health   Financial Resource Strain: Not on file  Food Insecurity: Not on file  Transportation Needs: Not on file  Physical  Activity: Not on file  Stress: Not on file  Social Connections: Not on file     Family History: [name] family history includes CVA in her sister; Cancer in her mother; Diabetes in her brother; Heart disease in her father; Other in her father; Prostate cancer (age of onset: 25) in her brother.  ROS:   Please see the history of present illness.     All other systems reviewed and are negative.  EKGs/Labs/Other Studies Reviewed:    The following studies were reviewed today:   Echocardiogram 06/27/20: 1. Global hypokinesis with akinesis of the inferior and inferolateral  wall.  2. Left ventricular ejection fraction, by estimation, is 30 to 35%. The  left ventricle has moderate to severely decreased function. The left  ventricle demonstrates regional wall motion abnormalities (see scoring  diagram/findings for description). There  is moderate left ventricular hypertrophy. Left ventricular diastolic  function could not be evaluated.  3. Right ventricular systolic function is normal. The right ventricular  size is normal. There is normal pulmonary artery systolic pressure.  4. Left atrial size was severely dilated.  5. Right atrial size was moderately dilated.  6. The mitral valve is normal in structure. Mild mitral valve  regurgitation. No evidence of mitral stenosis.  7. The aortic valve is tricuspid. Aortic valve regurgitation is trivial.  Mild to moderate aortic valve sclerosis/calcification is present, without  any evidence of aortic stenosis.  8. The inferior vena cava is dilated in size with >50% respiratory  variability, suggesting right atrial pressure of 8 mmHg.  _____________ Recent Labs: 10/29/2019: Magnesium 1.9 02/10/2020: ALT 11 06/26/2020: B Natriuretic Peptide 927.9 06/29/2020: Hemoglobin 15.6; Platelets 177 07/08/2020: BUN 26; Creatinine, Ser 1.42; Potassium 4.3; Sodium 144  Recent Lipid Panel    Component Value Date/Time   CHOL 116 11/12/2014 0432    TRIG 111 11/12/2014 0432   HDL 34 (L) 11/12/2014 0432   CHOLHDL 3.4 11/12/2014 0432   VLDL 22 11/12/2014 0432   LDLCALC 60 11/12/2014 0432     Risk Assessment/Calculations:       Physical Exam:    VS:  BP 120/70 (BP Location: Left Arm, Patient Position: Sitting, Cuff Size: Normal)   Pulse 97   Ht 5' 7.5" (1.715 m)   Wt 204 lb (92.5 kg)   SpO2 98%   BMI 31.48 kg/m     Wt Readings from Last 3  Encounters:  07/23/20 204 lb (92.5 kg)  07/08/20 206 lb 12.8 oz (93.8 kg)  06/29/20 200 lb 11.2 oz (91 kg)     GEN:  Well nourished, well developed in no acute distress HEENT: Normal NECK: No JVD; No carotid bruits LYMPHATICS: No lymphadenopathy CARDIAC: irreg rate controlled, no murmurs, rubs, gallops RESPIRATORY:  Clear to auscultation without rales, wheezing or rhonchi  ABDOMEN: Soft, non-tender, non-distended MUSCULOSKELETAL:  No edema; No deformity  SKIN: Warm and dry NEUROLOGIC:  Alert and oriented x 3 PSYCHIATRIC:  Normal affect   ASSESSMENT:    1. Persistent atrial fibrillation ([place])   2. Coronary artery disease involving coronary bypass graft of native heart without angina pectoris    PLAN:    In order of problems listed above:  Persistent atrial fibrillation -Remains in atrial fibrillation despite cardioversion with early return.  Metoprolol was increased to 100 a.m. 50 p.m. for better rate control.  Digoxin.  After discussion today, in good overall rate control, we will continue with rate control.  I am not convinced that repeat cardioversion would be successful at this time.  Chronic anticoagulation -Continue with Xarelto no issues.  Chronic systolic heart failure -EF have fluctuated.  Some normal some low.  Seems to be tachycardia mediated.  Overall well rate controlled.  Has seen [name] in the past.  Coronary artery disease -CABG 2016 nuclear stress test 2020 low risk.  Stage IIIb chronic kidney disease -Creatinine 1.5  COPD -Pulmonary  referral.  DM -It sounds like she is having a Jardiance associated yeast infection.  Asked her to hold this medication and talk to Dr. Lysle Rubens about alternatives.        Medication Adjustments/Labs and Tests Ordered: Current medicines are reviewed at length with the patient today.  Concerns regarding medicines are outlined above.  No orders of the defined types were placed in this encounter.  No orders of the defined types were placed in this encounter.   Patient Instructions  Medication Instructions:  Please hold your Jardiance. Continue all other mediations as listed.  *If you need a refill on your cardiac medications before your next appointment, please call your pharmacy*  Follow-Up: At [facility], you and your health needs are our priority.  As part of our continuing mission to provide you with exceptional heart care, we have created designated Provider Care Teams.  These Care Teams include your primary Cardiologist (physician) and Advanced Practice Providers (APPs -  Physician Assistants and Nurse Practitioners) who all work together to provide you with the care you need, when you need it.  We recommend signing up for the patient portal called "MyChart".  Sign up information is provided on this After Visit Summary.  MyChart is used to connect with patients for Virtual Visits (Telemedicine).  Patients are able to view [name]/test results, encounter notes, upcoming appointments, etc.  Non-urgent messages can be sent to your provider as well.   To learn more about what you can do with MyChart, go to [email or web address].    Your next appointment:   4 month(s)  The format for your next appointment:   In Person  Provider:   [name], MD  Thank you for choosing [facility]!!        Signed, [name], MD  07/23/2020 4:44 PM    [place]

## 2020-07-24 LAB — BASIC METABOLIC PANEL
BUN/Creatinine Ratio: 20 (ref 12–28)
BUN: 25 mg/dL (ref 8–27)
CO2: 15 mmol/L — ABNORMAL LOW (ref 20–29)
Calcium: 9.2 mg/dL (ref 8.7–10.3)
Chloride: 109 mmol/L — ABNORMAL HIGH (ref 96–106)
Creatinine, Ser: 1.23 mg/dL — ABNORMAL HIGH (ref 0.57–1.00)
GFR calc Af Amer: 50 mL/min/{1.73_m2} — ABNORMAL LOW (ref >59)
GFR calc non Af Amer: 43 mL/min/{1.73_m2} — ABNORMAL LOW (ref >59)
Glucose: 94 mg/dL (ref 65–99)
Potassium: 4.2 mmol/L (ref 3.5–5.2)
Sodium: 141 mmol/L (ref 134–144)

## 2020-07-28 DIAGNOSIS — I4891 Unspecified atrial fibrillation: Secondary | ICD-10-CM | POA: Diagnosis not present

## 2020-07-28 DIAGNOSIS — N183 Chronic kidney disease, stage 3 unspecified: Secondary | ICD-10-CM | POA: Diagnosis not present

## 2020-07-28 DIAGNOSIS — K219 Gastro-esophageal reflux disease without esophagitis: Secondary | ICD-10-CM | POA: Diagnosis not present

## 2020-07-28 DIAGNOSIS — I214 Non-ST elevation (NSTEMI) myocardial infarction: Secondary | ICD-10-CM | POA: Diagnosis not present

## 2020-07-28 DIAGNOSIS — I25810 Atherosclerosis of coronary artery bypass graft(s) without angina pectoris: Secondary | ICD-10-CM | POA: Diagnosis not present

## 2020-07-28 DIAGNOSIS — I5020 Unspecified systolic (congestive) heart failure: Secondary | ICD-10-CM | POA: Diagnosis not present

## 2020-07-28 DIAGNOSIS — I509 Heart failure, unspecified: Secondary | ICD-10-CM | POA: Diagnosis not present

## 2020-07-28 DIAGNOSIS — E1122 Type 2 diabetes mellitus with diabetic chronic kidney disease: Secondary | ICD-10-CM | POA: Diagnosis not present

## 2020-07-28 DIAGNOSIS — I10 Essential (primary) hypertension: Secondary | ICD-10-CM | POA: Diagnosis not present

## 2020-07-28 DIAGNOSIS — E7800 Pure hypercholesterolemia, unspecified: Secondary | ICD-10-CM | POA: Diagnosis not present

## 2020-07-28 DIAGNOSIS — M8580 Other specified disorders of bone density and structure, unspecified site: Secondary | ICD-10-CM | POA: Diagnosis not present

## 2020-07-28 DIAGNOSIS — J449 Chronic obstructive pulmonary disease, unspecified: Secondary | ICD-10-CM | POA: Diagnosis not present

## 2020-07-31 ENCOUNTER — Telehealth: Payer: Self-pay | Admitting: Cardiology

## 2020-07-31 MED ORDER — METOPROLOL SUCCINATE ER 100 MG PO TB24
100.0000 mg | ORAL_TABLET | Freq: Two times a day (BID) | ORAL | 3 refills | Status: DC
Start: 2020-07-31 — End: 2020-08-10

## 2020-07-31 NOTE — Telephone Encounter (Signed)
Patient c/o Palpitations:  High priority if patient c/o lightheadedness, shortness of breath, or chest pain  1) How long have you had palpitations/irregular HR/ Afib? Are you having the symptoms now? They come and go, mostly when she lays down at night. Is not having symptoms now.  2) Are you currently experiencing lightheadedness, SOB or CP? No.  3) Do you have a history of afib (atrial fibrillation) or irregular heart rhythm? Yes.  4) Have you checked your BP or HR? (document readings if available): No.  5) Are you experiencing any other symptoms? SOB and pain in right arm when Afib occurs.   Pt c/o medication issue:  1. Name of Medication: digoxin 62.5 MCG TABS    2. How are you currently taking this medication (dosage and times per day)? As prescribed.  3. Are you having a reaction (difficulty breathing--STAT)? No.  4. What is your medication issue? Patient states she thinks that taking there Digoxin with all of her other meds is causing it not to work as well as it should. She states when she lays down at night she goes into Afib and wants to know if she should take another digoxin when that occurs.

## 2020-07-31 NOTE — Telephone Encounter (Signed)
AFIB CLINIC INFORMATION: Your appointment is scheduled on: Thursday 12/30 at 1:30 pm The AFib Clinic is located in the Heart and Vascular Specialty Clinics at [facility]. Parking instructions/directions: [address] (off [address]). When you pull in to Entrance C, there is an underground parking garage to your right. The code to enter the garage is 3007. Take the elevators to the first floor. Follow the signs to the Heart and Vascular Specialty Clinics. You will see registration at the end of the hallway.  Phone number: [phone]  Spoke with pt at length regarding her s/s espeically when she lays down at night.  She reports feeling her heart beating real deep down into her stomach and it make it hard to breath.  Advised pt of [name] orders to increase Metoprolol to 100 mg bid.  She states understanding and that she will start that.  She reports having enough medication to do this right now.  Further refills can be can as necessary once she is seen and evaluated in the At [facility].  Pt also speaks on her recent loss of multiple family members (a sister and a brother), friends and a neighbor all back to back.  She states she feels as though she is in a depression because of it.  This RN reassured her with that much loss all at once, it would be hard not to feel depressed.   She reports that she is just going to keep going but of course this as all been very difficult for her.  Reassurance given that grief is a natural, normal process, that it is OK to feel the way she feels and it's OK to cry - all part of healing.   Advised her to reach out/call on other family members/friends to help support her.  Pt is in agreement and appreciative to be able to discuss these issues.    She will call back if s/s worsen after increasing her Metoprolol.  She will f/u in At [facility] as scheduled.

## 2020-07-31 NOTE — Telephone Encounter (Signed)
Let's increase the metoprolol to 100mg  PO BID (from 100 am, 50 pm) Have her see afib clinic in 2 weeks   [name], MD

## 2020-07-31 NOTE — Telephone Encounter (Signed)
Will have [name] to review and call back with further orders/instructions.

## 2020-08-02 ENCOUNTER — Other Ambulatory Visit: Payer: Self-pay

## 2020-08-02 ENCOUNTER — Emergency Department (HOSPITAL_COMMUNITY)
Admission: EM | Admit: 2020-08-02 | Discharge: 2020-08-02 | Disposition: A | Discharge status: Home / Self Care | Payer: HMO | Attending: Emergency Medicine | Admitting: Emergency Medicine

## 2020-08-02 ENCOUNTER — Encounter (HOSPITAL_COMMUNITY): Payer: Self-pay

## 2020-08-02 DIAGNOSIS — Z5321 Procedure and treatment not carried out due to patient leaving prior to being seen by health care provider: Secondary | ICD-10-CM | POA: Diagnosis not present

## 2020-08-02 DIAGNOSIS — Z760 Encounter for issue of repeat prescription: Secondary | ICD-10-CM | POA: Insufficient documentation

## 2020-08-02 NOTE — ED Notes (Signed)
Pt decided left without being seen

## 2020-08-02 NOTE — ED Triage Notes (Signed)
Patient here requesting inhaler refill since she cant get pulmonary appointment until January. denies any concerns but states she wants to have it when she does get SOB

## 2020-08-03 ENCOUNTER — Emergency Department (HOSPITAL_COMMUNITY): Payer: HMO

## 2020-08-03 ENCOUNTER — Other Ambulatory Visit: Payer: Self-pay

## 2020-08-03 ENCOUNTER — Emergency Department (HOSPITAL_COMMUNITY)
Admission: EM | Admit: 2020-08-03 | Discharge: 2020-08-03 | Disposition: A | Discharge status: Home / Self Care | Payer: HMO | Attending: Emergency Medicine | Admitting: Emergency Medicine

## 2020-08-03 DIAGNOSIS — I517 Cardiomegaly: Secondary | ICD-10-CM | POA: Diagnosis not present

## 2020-08-03 DIAGNOSIS — R0602 Shortness of breath: Secondary | ICD-10-CM | POA: Diagnosis not present

## 2020-08-03 DIAGNOSIS — Z5321 Procedure and treatment not carried out due to patient leaving prior to being seen by health care provider: Secondary | ICD-10-CM | POA: Insufficient documentation

## 2020-08-03 DIAGNOSIS — J90 Pleural effusion, not elsewhere classified: Secondary | ICD-10-CM | POA: Diagnosis not present

## 2020-08-03 LAB — CBC
HCT: 47.5 % — ABNORMAL HIGH (ref 36.0–46.0)
Hemoglobin: 14.9 g/dL (ref 12.0–15.0)
MCH: 26.8 pg (ref 26.0–34.0)
MCHC: 31.4 g/dL (ref 30.0–36.0)
MCV: 85.6 fL (ref 80.0–100.0)
Platelets: 155 10*3/uL (ref 150–400)
RBC: 5.55 MIL/uL — ABNORMAL HIGH (ref 3.87–5.11)
RDW: 17.9 % — ABNORMAL HIGH (ref 11.5–15.5)
WBC: 10.3 10*3/uL (ref 4.0–10.5)
nRBC: 0 % (ref 0.0–0.2)

## 2020-08-03 LAB — BASIC METABOLIC PANEL
Anion gap: 12 (ref 5–15)
BUN: 28 mg/dL — ABNORMAL HIGH (ref 8–23)
CO2: 19 mmol/L — ABNORMAL LOW (ref 22–32)
Calcium: 9.1 mg/dL (ref 8.9–10.3)
Chloride: 111 mmol/L (ref 98–111)
Creatinine, Ser: 1.39 mg/dL — ABNORMAL HIGH (ref 0.44–1.00)
GFR, Estimated: 40 mL/min — ABNORMAL LOW (ref >60)
Glucose, Bld: 104 mg/dL — ABNORMAL HIGH (ref 70–99)
Potassium: 3.8 mmol/L (ref 3.5–5.1)
Sodium: 142 mmol/L (ref 135–145)

## 2020-08-03 NOTE — ED Triage Notes (Signed)
Stated she as here yesterday for an inhaler but unable to wait, back here today for "afib"

## 2020-08-03 NOTE — ED Notes (Signed)
Pt stated that she can not stay any longer and that her ride is here . Pt was encouraged to stay, but refused . Pt informed to return to the ED if she feels worse or follow up with her pcp.

## 2020-08-03 NOTE — ED Notes (Addendum)
Pt said anything that needed to be discussed with her, she requests be discussed with her primary care physician. Pt doesn't want to wait any longer, pt wanting to leave but ride isn't answering phone. Pt deciding to stay at this time.

## 2020-08-03 NOTE — Patient Outreach (Signed)
  [facility] [facility]) Care Management Chronic Special Needs Program    08/03/2020  Name: Kellina, Dreese: 1946/03/23  MRN: [id number]   Ms. Tayla Panozzo is enrolled in a chronic special needs plan for Heart Failure. RNCM called to follow up. Client hospitalized last month with atrial fibrillation with rapid ventricular response. No answer. Unable to leave message.  Plan: RNCM will continue outreach attempts  Thea Silversmith, RN, MSN, [place] [phone]

## 2020-08-04 ENCOUNTER — Telehealth: Payer: Self-pay | Admitting: Cardiology

## 2020-08-04 ENCOUNTER — Other Ambulatory Visit: Payer: Self-pay

## 2020-08-04 NOTE — Telephone Encounter (Signed)
Patient is calling to follow up on a conversation she had with Olin Hauser on Friday. Please advise.

## 2020-08-04 NOTE — Telephone Encounter (Signed)
Spoke with patient who reports she went to the ED recently due to "not being able to breathe"  She did not stay for lab/x-ray results or further treatment.  Those results demonstrate EKG - At Fib HR 118 bpm, BMP with BUN/Crea 28/1.39 and CXR with cardiomegaly with mild vascular congestion and small bilaterally pleural effusions.   She reports she had tried to take the increased dose of Metoprolol as ordered by [name] on 12/17 but it made her heart feel like it would jump out of her chest.  She c/o SOB when laying down and with walking.  She c/o burning when she pees and is requesting an inhaler to help with SOB.  Advised pt she should take medications as ordered to help better control her HR which in turn will help with her SOB.  Advised to contact PCP d/t c/o burning with urination and for her request for an inhaler. Pt has not take any medication today because she "did stay the night" at her home last night.  Advised to take her morning medications as ordered including Digoxin, furosemide, losartan, metoprolol succinate and spironolactone.  She is aware to continue taking Xarelto as well.  Appt in At [facility] moved up from 12/30 to next available 12/27.  She will call back with any further questions or concerns.

## 2020-08-04 NOTE — Patient Outreach (Addendum)
  [facility]) Care Management Chronic Special Needs Program  08/04/2020  Name: [name] [name] DOB: Sep 23, 1945  MRN: [id number]  [name] [name] is enrolled in a chronic special needs plan for Heart Failure. RNCM called to follow up-Hospitalized last month for atrial fibrillation.    Subjective: Client reports she has noticed some episodes of shortness of breath and fast heart beat. She denies any swelling or edema of extremities. Client is not noticeably short of breath over the phone. [name] [name] states she called her cardiologist office today to report this and she reports she has an upcoming appointment on Monday. She reports she is not weighing self daily, but does weigh at times. Unable to obtain medication review as client states she is not where her medications are and not able to recall her medications from memory.  Goals Addressed              This Visit's Progress   .  Client verbalize knowledge of Heart Failure disease self management skills within the next 6-9 months.   On track     Reviewed the importance of daily weights. Reviewed signs/symptoms of heart failure exacerbation. Discussed the importance of monitoring salt intake and fluid intake. Client to discuss with cardiologist at appointment next week.     .  Client verbalizes knowledge of Heart Attack self management skills within the next 6-9 months.   On track     states she is taking medications as scheduled and follows up with cardiologist as scheduled.    .  Client will verbalize self-management skills related to Atrial Fibrillation within the next 6-9 months.   On track     Review [employer] calendar sent in the mail for Atrial Fibrillation information and action plan.  Call your provider if you have a racing or irregular heart beat that may be uncomfortable, shortness of breath with or without chest pain, weakness and dizziness.  Call 911 for severe symptoms such as chest pain, fainting  and struggling to breathe. Continue to take medications as prescribed.  Continue to follow up with your providers as scheduled and as needed. Discussed upcoming appointment scheduled for next week.    .  COMPLETED: General - Client will not be readmitted within 30 days (C-SNP) discharge date revised 06/30/20        No readmission within 30 days.      .  COMPLETED: Maintain timely refills of Heart Failure medication as prescribed within the year         Client reports she has all medications prescribed. Please call if you have further difficulty obtaining your medications.    .  COMPLETED: Patient Stated: client will verbalize receipt of prediabetes and diabetes diet education. (pt-stated)        Client verbalized no questions at this time.        Plan: [place] Management will continue to provide services for this member through 08/14/2020. The [employer] Care Management Team will assume care 08/15/2020.   [name] Silversmith, RN, MSN, [place] [phone]

## 2020-08-04 NOTE — Telephone Encounter (Signed)
Patient is calling wanting to have a follow up conversation with Olin Hauser. Please call.

## 2020-08-04 NOTE — Telephone Encounter (Signed)
Please see telephone note from 08/04/2020

## 2020-08-05 ENCOUNTER — Other Ambulatory Visit: Payer: Self-pay

## 2020-08-05 NOTE — Patient Outreach (Signed)
  [place] [facility]) Care Management Chronic Special Needs Program    08/05/2020  Name: [name], [name]: [birth year]  MRN: [id number]   [name] is enrolled in a chronic special needs plan for Heart Failure. [place] Management will continue to provide services for this member through 08/14/2020. The [employer] Care Management Team will assume care 08/15/2020.

## 2020-08-10 ENCOUNTER — Ambulatory Visit (HOSPITAL_BASED_OUTPATIENT_CLINIC_OR_DEPARTMENT_OTHER)
Admission: RE | Admit: 2020-08-10 | Discharge: 2020-08-10 | Disposition: A | Discharge status: Home / Self Care | Payer: HMO | Source: Ambulatory Visit | Attending: Nurse Practitioner | Admitting: Nurse Practitioner

## 2020-08-10 ENCOUNTER — Other Ambulatory Visit: Payer: Self-pay

## 2020-08-10 ENCOUNTER — Encounter (HOSPITAL_COMMUNITY): Payer: Self-pay | Admitting: Nurse Practitioner

## 2020-08-10 VITALS — BP 136/80 | HR 147 | Ht 67.5 in | Wt 206.8 lb

## 2020-08-10 DIAGNOSIS — Z20822 Contact with and (suspected) exposure to covid-19: Secondary | ICD-10-CM | POA: Diagnosis not present

## 2020-08-10 DIAGNOSIS — D6869 Other thrombophilia: Secondary | ICD-10-CM

## 2020-08-10 DIAGNOSIS — I2510 Atherosclerotic heart disease of native coronary artery without angina pectoris: Secondary | ICD-10-CM | POA: Insufficient documentation

## 2020-08-10 DIAGNOSIS — I110 Hypertensive heart disease with heart failure: Secondary | ICD-10-CM | POA: Insufficient documentation

## 2020-08-10 DIAGNOSIS — Z951 Presence of aortocoronary bypass graft: Secondary | ICD-10-CM | POA: Insufficient documentation

## 2020-08-10 DIAGNOSIS — E785 Hyperlipidemia, unspecified: Secondary | ICD-10-CM | POA: Insufficient documentation

## 2020-08-10 DIAGNOSIS — I4819 Other persistent atrial fibrillation: Secondary | ICD-10-CM

## 2020-08-10 DIAGNOSIS — E669 Obesity, unspecified: Secondary | ICD-10-CM | POA: Diagnosis not present

## 2020-08-10 DIAGNOSIS — I130 Hypertensive heart and chronic kidney disease with heart failure and stage 1 through stage 4 chronic kidney disease, or unspecified chronic kidney disease: Secondary | ICD-10-CM | POA: Diagnosis not present

## 2020-08-10 DIAGNOSIS — I255 Ischemic cardiomyopathy: Secondary | ICD-10-CM | POA: Insufficient documentation

## 2020-08-10 DIAGNOSIS — Z7901 Long term (current) use of anticoagulants: Secondary | ICD-10-CM | POA: Insufficient documentation

## 2020-08-10 DIAGNOSIS — I5043 Acute on chronic combined systolic (congestive) and diastolic (congestive) heart failure: Secondary | ICD-10-CM | POA: Diagnosis not present

## 2020-08-10 DIAGNOSIS — E1122 Type 2 diabetes mellitus with diabetic chronic kidney disease: Secondary | ICD-10-CM | POA: Diagnosis not present

## 2020-08-10 DIAGNOSIS — J449 Chronic obstructive pulmonary disease, unspecified: Secondary | ICD-10-CM | POA: Diagnosis not present

## 2020-08-10 DIAGNOSIS — I4891 Unspecified atrial fibrillation: Secondary | ICD-10-CM | POA: Diagnosis not present

## 2020-08-10 DIAGNOSIS — F17210 Nicotine dependence, cigarettes, uncomplicated: Secondary | ICD-10-CM | POA: Insufficient documentation

## 2020-08-10 DIAGNOSIS — I5042 Chronic combined systolic (congestive) and diastolic (congestive) heart failure: Secondary | ICD-10-CM | POA: Insufficient documentation

## 2020-08-10 DIAGNOSIS — Z6831 Body mass index (BMI) 31.0-31.9, adult: Secondary | ICD-10-CM | POA: Diagnosis not present

## 2020-08-10 DIAGNOSIS — Z8674 Personal history of sudden cardiac arrest: Secondary | ICD-10-CM | POA: Diagnosis not present

## 2020-08-10 DIAGNOSIS — N183 Chronic kidney disease, stage 3 unspecified: Secondary | ICD-10-CM | POA: Diagnosis not present

## 2020-08-10 DIAGNOSIS — K625 Hemorrhage of anus and rectum: Secondary | ICD-10-CM | POA: Diagnosis not present

## 2020-08-10 MED ORDER — METOPROLOL SUCCINATE ER 100 MG PO TB24: ORAL_TABLET | ORAL | 3 refills | Status: DC

## 2020-08-10 MED ORDER — SPIRONOLACTONE 25 MG PO TABS: 12.5000 mg | ORAL_TABLET | ORAL | Status: DC | PRN

## 2020-08-10 NOTE — Patient Instructions (Signed)
Increase metoprolol 100mg  in the morning and 150mg  in the evening

## 2020-08-10 NOTE — Progress Notes (Addendum)
Primary Care Physician: [name], MD Referring Physician:[name]    [name] [name] is a 74 y.o. female with a h/o  hypertension,   persistent  atrial fibrillation,CHADS VASC Score 5, status post left atrial appendage clipping, coronary artery disease status post CABG, hyperlipidemia, ischemic cardiomyopathy  I saw her last in December 2018 and at that time I sent her to the ER with afib with RVR and vaginal bleeding. She was last seen by [name], 12/17 and was in afib with v rates in the 90's and it was decided to rate control the pt. Metoprolol was increased to 100 mg bid, history of non compliance but pt states she is taking BB. States she has not been able to get digoxin from  pharmacy.  She then presented to the ER for afib with RVR, 12/20  but got tired of waiting and left. She was admitted for afib with RVR in November and left in rate controlled afib.Cardizem was stopped, digoxin was added. Remained  on BB.  Recent cardioversion with ERAF.   I am now seeing today for ongoing afib with RVR. She missed her xarelto last night. Ekg on presentation showed afib with v rates in the 140's, pusle ox with HR's 120-140's. I offered to send her to the ER but she states that she would rather go home. I was considering adding cardizem 120 mg daily but with EF of 30-35%, I hesitate to do this, so will increase BB. Urgent cardioversion is not an option as she missed a dose of xarelto last night. She is breathing ok at night  as long as she is propped up some. She does wheeze at times. She has COPD and has returned to smoking. CHA2DS2VASc score of 7.   Today, she denies symptoms of palpitations, chest pain, shortness of breath, orthopnea, PND, lower extremity edema, dizziness, presyncope, syncope, or neurologic sequela. The patient is tolerating medications without difficulties and is otherwise without complaint today.   Past Medical History:  Diagnosis Date  . (HFimpEF) heart failure with improved  ejection fraction ([place])    a. 07/2018 Echo: EF 30-35%; b. 11/2019 Echo: EF 55-60%, no rwma, Gr2 DD, Nl RV size/fxn. Mild BAE. Mild MR/AI.  Marland Kitchen Arthritis   . Asthma    ??  . Atopic dermatitis   . CAD (coronary artery disease)    a. 2016 s/p CABG x 2 (LIMA->LAD, VG->OM); b. 08/2018 MV: EF 44%, no ischemia/infact.  . Cardiac arrest ([place]) 10/2014  . Cardiomyopathy, ischemic    a. 07/2018 Echo: EF 30-35%; 11/2019 Echo: EF 55-60%.  . Carotid arterial disease ([place])    a. 11/2019 Carotid U/S  . CKD (chronic kidney disease), stage III ([place])   . COPD (chronic obstructive pulmonary disease) ([place])   . Esophageal dilatation 2013  . GERD (gastroesophageal reflux disease)   . Gout   . Headache   . Hypertension   . Hypokalemia   . Idiopathic angioedema   . LGI bleed 08/06/2017   a. felt to be hemorrhoidal during that admission (no drop in Hgb).  . Lower back pain   . Paroxysmal atrial fibrillation ([place])    a. Dx 2016-->h/o difficult to control rates (complicated by noncompliance), not felt to be a candidate for ablation or antiarrhythmic due to noncompliance; b. Recurrent AF 2021 - converted w/ IV dilt; c. CHA2DS2VASc = 7-->Xarelto.  . Personal history of noncompliance with medical treatment, presenting hazards to health   . Prediabetes   . S/P CABG  x 2 with clipping of LA appendage 11/14/2014   LIMA to LAD, SVG to OM, EVH via right thigh  . Urine incontinence   . Uterine fibroid    Past Surgical History:  Procedure Laterality Date  . BALLOON DILATION N/A 10/10/2019   Procedure: BALLOON DILATION;  Surgeon: Otis Brace, MD;  Location: [facility];  Service: Gastroenterology;  Laterality: N/A;  . BIOPSY  10/10/2019   Procedure: BIOPSY;  Surgeon: Otis Brace, MD;  Location: [place];  Service: Gastroenterology;;  . BIOPSY  10/11/2019   Procedure: BIOPSY;  Surgeon: Otis Brace, MD;  Location: [place];  Service: Gastroenterology;;  . CARDIOVERSION N/A 11/18/2014   Procedure:  CARDIOVERSION;  Surgeon: [name], MD;  Location: [place];  Service: Cardiovascular;  Laterality: N/A;  . CARDIOVERSION N/A 12/25/2017   Procedure: CARDIOVERSION;  Surgeon: [name], MD;  Location: [place];  Service: Cardiovascular;  Laterality: N/A;  . CLIPPING OF ATRIAL APPENDAGE N/A 11/14/2014   Procedure: CLIPPING OF ATRIAL APPENDAGE;  Surgeon: [name], MD;  Location: [place];  Service: Open Heart Surgery;  Laterality: N/A;  . COLONOSCOPY  2013  . COLONOSCOPY WITH PROPOFOL N/A 10/11/2019   Procedure: COLONOSCOPY WITH PROPOFOL;  Surgeon: Otis Brace, MD;  Location: [place];  Service: Gastroenterology;  Laterality: N/A;  . CORONARY ARTERY BYPASS GRAFT N/A 11/14/2014   Procedure: CORONARY ARTERY BYPASS GRAFTING (CABG)TIMES 2 USING LEFT INTERNAL MAMMARY ARTERY AND RIGHT SAPHENOUS VEIN HARVESTED ENDOSCOPICALLY;  Surgeon: [name], MD;  Location: [place];  Service: Open Heart Surgery;  Laterality: N/A;  . ESOPHAGOGASTRODUODENOSCOPY (EGD) WITH PROPOFOL N/A 10/10/2019   Procedure: ESOPHAGOGASTRODUODENOSCOPY (EGD) WITH PROPOFOL;  Surgeon: Otis Brace, MD;  Location: [facility];  Service: Gastroenterology;  Laterality: N/A;  . ESOPHAGOGASTRODUODENOSCOPY (EGD) WITH PROPOFOL N/A 10/27/2019   Procedure: ESOPHAGOGASTRODUODENOSCOPY (EGD) WITH PROPOFOL;  Surgeon: Ronnette Juniper, MD;  Location: [place];  Service: Gastroenterology;  Laterality: N/A;  . GIVENS CAPSULE STUDY N/A 10/27/2019   Procedure: GIVENS CAPSULE STUDY;  Surgeon: Ronnette Juniper, MD;  Location: [place];  Service: Gastroenterology;  Laterality: N/A;  . LEFT HEART CATHETERIZATION WITH CORONARY ANGIOGRAM N/A 11/04/2014   Procedure: LEFT HEART CATHETERIZATION WITH CORONARY ANGIOGRAM;  Surgeon: Troy Sine, MD;  Location[facility] CATH LAB;  Service: Cardiovascular;  Laterality: N/A;  . POLYPECTOMY  10/11/2019   Procedure: POLYPECTOMY;  Surgeon: Otis Brace, MD;  Location: [place] [facility]OSCOPY;  Service:  Gastroenterology;;  . TEE WITHOUT CARDIOVERSION N/A 11/14/2014   Procedure: TRANSESOPHAGEAL ECHOCARDIOGRAM (TEE);  Surgeon: [name], MD;  Location: [place];  Service: Open Heart Surgery;  Laterality: N/A;  . TEE WITHOUT CARDIOVERSION N/A 12/25/2017   Procedure: TRANSESOPHAGEAL ECHOCARDIOGRAM (TEE);  Surgeon: [name], MD;  Location: [facility] ENDOSCOPY;  Service: Cardiovascular;  Laterality: N/A;  . TEMPORARY PACEMAKER INSERTION  11/04/2014   Procedure: TEMPORARY PACEMAKER INSERTION;  Surgeon: Troy Sine, MD;  Location: [facility] C[facility] LAB;  Service: Cardiovascular;;  . TOOTH EXTRACTION      Current Outpatient Medications  Medication Sig Dispense Refill  . acetaminophen (TYLENOL) 500 MG tablet Take 500 mg by mouth every 6 (six) hours as needed for moderate pain.     Marland Kitchen allopurinol (ZYLOPRIM) 300 MG tablet Take 1 tablet (300 mg total) by mouth daily. 30 tablet 11  . atorvastatin (LIPITOR) 10 MG tablet Take 5-10 mg by mouth at bedtime.     . Coenzyme Q10 (CO Q-10 PO) Take 1 capsule by mouth daily.     . famotidine (PEPCID)  20 MG tablet Take 20 mg by mouth daily.    . furosemide (LASIX) 20 MG tablet Take 2 tablets (40 mg total) by mouth daily. 90 tablet 2  . losartan (COZAAR) 25 MG tablet Take 1 tablet (25 mg total) by mouth 2 (two) times daily. 60 tablet 6  . pantoprazole (PROTONIX) 40 MG tablet Take 40 mg by mouth as needed.    . rivaroxaban (XARELTO) 20 MG TABS tablet Take 1 tablet (20 mg total) by mouth daily with supper. Resume from 11/01/2019    . digoxin 62.5 MCG TABS Take 0.0625 mg by mouth daily. (Patient not taking: Reported on 08/10/2020) 30 tablet 6  . metoprolol succinate (TOPROL-XL) 100 MG 24 hr tablet Take 1 tablet in the AM and 1.5 tablet in the PM 180 tablet 3  . spironolactone (ALDACTONE) 25 MG tablet Take 0.5 tablets (12.5 mg total) by mouth as needed.     No current facility-administered medications for this encounter.    Allergies  Allergen Reactions  . Bee Venom  Anaphylaxis  . Ivp Dye [Iodinated Diagnostic Agents] Anaphylaxis  . Codeine Nausea And Vomiting  . Entresto [Sacubitril-Valsartan]     Chest pain   . Shrimp [Shellfish Allergy] Swelling  . Jardiance [Empagliflozin] Other (See Comments)    Caused Boils     Social History   Socioeconomic History  . Marital status: Widowed    Spouse name: Not on file  . Number of children: 1  . Years of education: 19  . Highest education level: Not on file  Occupational History  . Occupation: RETIRED [employer]  Tobacco Use  . Smoking status: Current Some Day Smoker    Packs/day: 0.10    Years: 56.00    Pack years: 5.60    Types: Cigarettes  . Smokeless tobacco: Never Used  . Tobacco comment: "I might still take a puff now and then"  Vaping Use  . Vaping Use: Never used  Substance and Sexual Activity  . Alcohol use: No    Alcohol/week: 0.0 standard drinks  . Drug use: No  . Sexual activity: Not on file  Other Topics Concern  . Not on file  Social History Narrative   Patient reports it being difficult to pay for everything due to outstanding medical bills from hospital stay last year but states she is able to keep up with basic expenses.  Patient does have some concerns with her house's condition due to a water leak- had her roof repaired last month but has some leaking in the front which has affected her porch.  Patient owns her own car and is able to drive herself but has some concerns about the reliability of her car- has gotten taxi to the clinic for appointment in the past when her car broke down.   Social Determinants of Health   Financial Resource Strain: Not on file  Food Insecurity: Not on file  Transportation Needs: Not on file  Physical Activity: Not on file  Stress: Not on file  Social Connections: Not on file  Intimate Partner Violence: Not on file    Family History  Problem Relation Age of Onset  . Cancer Mother        [place]MPH[place]  . Heart disease Father   .  Other Father        TB  . CVA Sister   . Prostate cancer Brother 42  . Diabetes Brother     ROS- All systems are reviewed and negative except as  per the HPI above  Physical Exam: Vitals:   08/10/20 1430  BP: 136/80  Pulse: (!) 147  SpO2: 100%  Weight: 93.8 kg  Height: 5' 7.5" (1.715 m)   Wt Readings from Last 3 Encounters:  08/10/20 93.8 kg  08/03/20 93 kg  08/02/20 92.1 kg    Labs: Lab Results  Component Value Date   NA 142 08/03/2020   K 3.8 08/03/2020   CL 111 08/03/2020   CO2 19 (L) 08/03/2020   GLUCOSE 104 (H) 08/03/2020   BUN 28 (H) 08/03/2020   CREATININE 1.39 (H) 08/03/2020   CALCIUM 9.1 08/03/2020   PHOS 3.0 11/09/2014   MG 1.9 10/29/2019   Lab Results  Component Value Date   INR 3.3 (H) 06/27/2020   Lab Results  Component Value Date   CHOL 116 11/12/2014   HDL 34 (L) 11/12/2014   LDLCALC 60 11/12/2014   TRIG 111 11/12/2014     GEN- The patient is well appearing, alert and oriented x 3 today.   Head- normocephalic, atraumatic Eyes-  Sclera clear, conjunctiva pink Ears- hearing intact Oropharynx- clear Neck- supple, no JVP Lymph- no cervical lymphadenopathy Lungs- Clear to ausculation bilaterally, normal work of breathing Heart- Rapid irregular rate and rhythm, no murmurs, rubs or gallops, PMI not laterally displaced GI- soft, NT, ND, + BS Extremities- no clubbing, cyanosis, or edema MS- no significant deformity or atrophy Skin- no rash or lesion Psych- euthymic mood, full affect Neuro- strength and sensation are intact  EKG-afib at 147 bpm, qrs int 90 bpm, qtc 494 ms Pulse ox shows HR 120- 140 bpm   Assessment and Plan: 1. Persistent afib  Afib  with RVR  Will not add CCB for reduced  EF but will increase pm dose of Toprol to 150 in pm and 100 mg in am Offered the ER but she does not want to wait I will see back in the am If condition worsens to the ER In the past Epic reflects that she is not an candidate for an AAD or  ablation  for non complinace   2. CHA2DS2VASc score of at least 7  Continue xarleto 20 mg daily  Reminded not to miss doses   3. Combined systolic/diastolic HF So far weight is stable  Continue  lasix dailty  F/u as scheduled,  tomorrow in afib clinic  If she continues to have RVR without any other options, she may need to be considered for AV nodal ablation with PPM    Butch Penny C. Yuridiana Formanek, [facility] [address] [place], [place] [address] [phone]

## 2020-08-11 ENCOUNTER — Ambulatory Visit (HOSPITAL_BASED_OUTPATIENT_CLINIC_OR_DEPARTMENT_OTHER)
Admission: RE | Admit: 2020-08-11 | Discharge: 2020-08-11 | Disposition: A | Discharge status: Home / Self Care | Payer: HMO | Source: Ambulatory Visit | Attending: Nurse Practitioner | Admitting: Nurse Practitioner

## 2020-08-11 ENCOUNTER — Encounter (HOSPITAL_COMMUNITY): Payer: Self-pay | Admitting: Nurse Practitioner

## 2020-08-11 ENCOUNTER — Ambulatory Visit (INDEPENDENT_AMBULATORY_CARE_PROVIDER_SITE_OTHER): Payer: HMO | Admitting: Internal Medicine

## 2020-08-11 ENCOUNTER — Encounter: Payer: Self-pay | Admitting: Internal Medicine

## 2020-08-11 VITALS — BP 106/74 | HR 121 | Ht 67.5 in | Wt 207.8 lb

## 2020-08-11 VITALS — BP 98/60 | HR 115 | Ht 67.5 in | Wt 206.0 lb

## 2020-08-11 DIAGNOSIS — K625 Hemorrhage of anus and rectum: Secondary | ICD-10-CM | POA: Diagnosis not present

## 2020-08-11 DIAGNOSIS — Z6831 Body mass index (BMI) 31.0-31.9, adult: Secondary | ICD-10-CM | POA: Diagnosis not present

## 2020-08-11 DIAGNOSIS — Z7901 Long term (current) use of anticoagulants: Secondary | ICD-10-CM | POA: Insufficient documentation

## 2020-08-11 DIAGNOSIS — I5043 Acute on chronic combined systolic (congestive) and diastolic (congestive) heart failure: Secondary | ICD-10-CM | POA: Diagnosis not present

## 2020-08-11 DIAGNOSIS — I5040 Unspecified combined systolic (congestive) and diastolic (congestive) heart failure: Secondary | ICD-10-CM | POA: Insufficient documentation

## 2020-08-11 DIAGNOSIS — E1122 Type 2 diabetes mellitus with diabetic chronic kidney disease: Secondary | ICD-10-CM | POA: Diagnosis not present

## 2020-08-11 DIAGNOSIS — Z951 Presence of aortocoronary bypass graft: Secondary | ICD-10-CM | POA: Insufficient documentation

## 2020-08-11 DIAGNOSIS — N183 Chronic kidney disease, stage 3 unspecified: Secondary | ICD-10-CM | POA: Insufficient documentation

## 2020-08-11 DIAGNOSIS — F17210 Nicotine dependence, cigarettes, uncomplicated: Secondary | ICD-10-CM | POA: Insufficient documentation

## 2020-08-11 DIAGNOSIS — E669 Obesity, unspecified: Secondary | ICD-10-CM | POA: Diagnosis not present

## 2020-08-11 DIAGNOSIS — E785 Hyperlipidemia, unspecified: Secondary | ICD-10-CM | POA: Insufficient documentation

## 2020-08-11 DIAGNOSIS — I255 Ischemic cardiomyopathy: Secondary | ICD-10-CM | POA: Insufficient documentation

## 2020-08-11 DIAGNOSIS — D6869 Other thrombophilia: Secondary | ICD-10-CM

## 2020-08-11 DIAGNOSIS — I4819 Other persistent atrial fibrillation: Secondary | ICD-10-CM | POA: Diagnosis not present

## 2020-08-11 DIAGNOSIS — I130 Hypertensive heart and chronic kidney disease with heart failure and stage 1 through stage 4 chronic kidney disease, or unspecified chronic kidney disease: Secondary | ICD-10-CM | POA: Diagnosis not present

## 2020-08-11 DIAGNOSIS — I2510 Atherosclerotic heart disease of native coronary artery without angina pectoris: Secondary | ICD-10-CM | POA: Insufficient documentation

## 2020-08-11 DIAGNOSIS — Z20822 Contact with and (suspected) exposure to covid-19: Secondary | ICD-10-CM | POA: Diagnosis not present

## 2020-08-11 DIAGNOSIS — Z8674 Personal history of sudden cardiac arrest: Secondary | ICD-10-CM | POA: Diagnosis not present

## 2020-08-11 DIAGNOSIS — J449 Chronic obstructive pulmonary disease, unspecified: Secondary | ICD-10-CM | POA: Diagnosis not present

## 2020-08-11 DIAGNOSIS — I480 Paroxysmal atrial fibrillation: Secondary | ICD-10-CM | POA: Diagnosis not present

## 2020-08-11 MED ORDER — DIGOXIN 125 MCG PO TABS: 0.0625 mg | ORAL_TABLET | Freq: Every day | ORAL | 3 refills | Status: DC

## 2020-08-11 NOTE — Addendum Note (Signed)
Encounter addended by: Newman Nip, NP on: 08/11/2020 12:23 PM  Actions taken: Clinical Note Signed

## 2020-08-11 NOTE — Progress Notes (Signed)
Primary Care Physician: Georgann Housekeeper, MD Referring Physician:Dr. Anne Fu    [name] [name] is a 74 y.o. female with a h/o  hypertension,   persistent  atrial fibrillation,CHADS VASC Score 5, status post left atrial appendage clipping, coronary artery disease status post CABG, hyperlipidemia, ischemic cardiomyopathy  I saw her last in December 2018 and at that time I sent her to the ER with afib with RVR and vaginal bleeding. She was last seen by Dr. Anne Fu, 12/17 and was in afib with v rates in the 90's and it was decided to rate control the pt. Metoprolol was increased to 100 mg bid, history of non compliance but pt states she is taking BB. States she has not been able to get digoxin from  pharmacy.  She then presented to the ER for afib with RVR, 12/20  but got tired of waiting and left. She was admitted for afib with RVR in November and left in rate controlled afib.Cardizem was stopped, digoxin was added. Remained  on BB.  Recent cardioversion with ERAF.   I am now seeing today for ongoing afib with RVR. She missed her xarelto last night. Ekg on presentation showed afib with v rates in the 140's, pusle ox with HR's 120-140's. I offered to send her to the ER but she states that she would rather go home. I was considering adding cardizem 120 mg daily but with EF of 30-35%, I hesitate to do this, so will increase BB. Urgent cardioversion is not an option as she missed a dose of xarelto last night. She is breathing ok at night  as long as she is propped up some. She does wheeze at times. She has COPD and has returned to smoking. CHA2DS2VASc score of 7.   F/u in afib clinic, 08/11/20. She still has RVR with extra 50 mg metoprolol last pm on top of 100 mg bid. She states that she did not feel well taking that extra  dose. Her HR today is marginally  slower at 121 bpm, but still needs more definite treatment as I feel she will end up in HF.  I discussed with Dr. Ladona Ridgel and he is in agreement to see her  today at 1:30 pm for consideration of AV nodal ablation with PPM,  as I do not know of any other option to control her v rates, other than put her back on diltiazem, which with  her decreased EF, makes this not a optimal choice. I again discussed with her to go to the ER and she would like this to be her last option.   Today, she denies symptoms of palpitations, chest pain, shortness of breath, orthopnea, PND, lower extremity edema, dizziness, presyncope, syncope, or neurologic sequela. The patient is tolerating medications without difficulties and is otherwise without complaint today.   Past Medical History:  Diagnosis Date  . (HFimpEF) heart failure with improved ejection fraction (HCC)    a. 07/2018 Echo: EF 30-35%; b. 11/2019 Echo: EF 55-60%, no rwma, Gr2 DD, Nl RV size/fxn. Mild BAE. Mild MR/AI.  Marland Kitchen Arthritis   . Asthma    ??  . Atopic dermatitis   . CAD (coronary artery disease)    a. 2016 s/p CABG x 2 (LIMA->LAD, VG->OM); b. 08/2018 MV: EF 44%, no ischemia/infact.  . Cardiac arrest (HCC) 10/2014  . Cardiomyopathy, ischemic    a. 07/2018 Echo: EF 30-35%; 11/2019 Echo: EF 55-60%.  . Carotid arterial disease (HCC)    a. 11/2019 Carotid U/S  .  CKD (chronic kidney disease), stage III ([place])   . COPD (chronic obstructive pulmonary disease) ([place])   . Esophageal dilatation 2013  . GERD (gastroesophageal reflux disease)   . Gout   . Headache   . Hypertension   . Hypokalemia   . Idiopathic angioedema   . LGI bleed 08/06/2017   a. felt to be hemorrhoidal during that admission (no drop in Hgb).  . Lower back pain   . Paroxysmal atrial fibrillation ([place])    a. Dx 2016-->h/o difficult to control rates (complicated by noncompliance), not felt to be a candidate for ablation or antiarrhythmic due to noncompliance; b. Recurrent AF 2021 - converted w/ IV dilt; c. CHA2DS2VASc = 7-->Xarelto.  . Personal history of noncompliance with medical treatment, presenting hazards to health   . Prediabetes   .  S/P CABG x 2 with clipping of LA appendage 11/14/2014   LIMA to LAD, SVG to OM, EVH via right thigh  . Urine incontinence   . Uterine fibroid    Past Surgical History:  Procedure Laterality Date  . BALLOON DILATION N/A 10/10/2019   Procedure: BALLOON DILATION;  Surgeon: Otis Brace, MD;  [facility];  Service: Gastroenterology;  Laterality: N/A;  . BIOPSY  10/10/2019   Procedure: BIOPSY;  Surgeon: Otis Brace, MD;  Location: [place];  Service: Gastroenterology;;  . BIOPSY  10/11/2019   Procedure: BIOPSY;  Surgeon: Otis Brace, MD;  Location: [place];  Service: Gastroenterology;;  . CARDIOVERSION N/A 11/18/2014   Procedure: CARDIOVERSION;  Surgeon: [name], MD;  Location: [place];  Service: Cardiovascular;  Laterality: N/A;  . CARDIOVERSION N/A 12/25/2017   Procedure: CARDIOVERSION;  Surgeon: [name], MD;  Location: [place];  Service: Cardiovascular;  Laterality: N/A;  . CLIPPING OF ATRIAL APPENDAGE N/A 11/14/2014   Procedure: CLIPPING OF ATRIAL APPENDAGE;  Surgeon: [name], MD;  Location: [place];  Service: Open Heart Surgery;  Laterality: N/A;  . COLONOSCOPY  2013  . COLONOSCOPY WITH PROPOFOL N/A 10/11/2019   Procedure: COLONOSCOPY WITH PROPOFOL;  Surgeon: Otis Brace, MD;  Location: [place];  Service: Gastroenterology;  Laterality: N/A;  . CORONARY ARTERY BYPASS GRAFT N/A 11/14/2014   Procedure: CORONARY ARTERY BYPASS GRAFTING (CABG)TIMES 2 USING LEFT INTERNAL MAMMARY ARTERY AND RIGHT SAPHENOUS VEIN HARVESTED ENDOSCOPICALLY;  Surgeon: [name], MD;  Location: [place];  Service: Open Heart Surgery;  Laterality: N/A;  . ESOPHAGOGASTRODUODENOSCOPY (EGD) WITH PROPOFOL N/A 10/10/2019   Procedure: ESOPHAGOGASTRODUODENOSCOPY (EGD) WITH PROPOFOL;  Surgeon: Otis Brace, MD;  [facility];  Service: Gastroenterology;  Laterality: N/A;  . ESOPHAGOGASTRODUODENOSCOPY (EGD) WITH PROPOFOL N/A 10/27/2019   Procedure:  ESOPHAGOGASTRODUODENOSCOPY (EGD) WITH PROPOFOL;  Surgeon: Ronnette Juniper, MD;  Location: [place];  Service: Gastroenterology;  Laterality: N/A;  . GIVENS CAPSULE STUDY N/A 10/27/2019   Procedure: GIVENS CAPSULE STUDY;  Surgeon: Ronnette Juniper, MD;  Location: [place];  Service: Gastroenterology;  Laterality: N/A;  . LEFT HEART CATHETERIZATION WITH CORONARY ANGIOGRAM N/A 11/04/2014   Procedure: LEFT HEART CATHETERIZATION WITH CORONARY ANGIOGRAM;  Surgeon: Troy Sine, MD;  Location: [facility] CATH LAB;  Service: Cardiovascular;  Laterality: N/A;  . POLYPECTOMY  10/11/2019   Procedure: POLYPECTOMY;  Surgeon: Otis Brace, MD;  Location: [place] [facility];  Service: Gastroenterology;;  . TEE WITHOUT CARDIOVERSION N/A 11/14/2014   Procedure: TRANSESOPHAGEAL ECHOCARDIOGRAM (TEE);  Surgeon: [name], MD;  Location: [place];  Service: Open Heart Surgery;  Laterality: N/A;  . TEE WITHOUT CARDIOVERSION N/A 12/25/2017   Procedure: TRANSESOPHAGEAL  ECHOCARDIOGRAM (TEE);  Surgeon: [name], MD;  Location: [facility] ENDOSCOPY;  Service: Cardiovascular;  Laterality: N/A;  . TEMPORARY PACEMAKER INSERTION  11/04/2014   Procedure: TEMPORARY PACEMAKER INSERTION;  Surgeon: Troy Sine, MD;  Location: [facility] CATH LAB;  Service: Cardiovascular;;  . TOOTH EXTRACTION      Current Outpatient Medications  Medication Sig Dispense Refill  . acetaminophen (TYLENOL) 500 MG tablet Take 500 mg by mouth every 6 (six) hours as needed for moderate pain.     Marland Kitchen allopurinol (ZYLOPRIM) 300 MG tablet Take 1 tablet (300 mg total) by mouth daily. 30 tablet 11  . atorvastatin (LIPITOR) 10 MG tablet Take 5-10 mg by mouth at bedtime.     . Coenzyme Q10 (CO Q-10 PO) Take 1 capsule by mouth daily.     . famotidine (PEPCID) 20 MG tablet Take 20 mg by mouth daily.    . furosemide (LASIX) 20 MG tablet Take 2 tablets (40 mg total) by mouth daily. 90 tablet 2  . losartan (COZAAR) 25 MG tablet Take 1 tablet (25 mg total) by mouth 2 (two) times  daily. 60 tablet 6  . metoprolol succinate (TOPROL-XL) 100 MG 24 hr tablet Take 1 tablet in the AM and 1.5 tablet in the PM 180 tablet 3  . pantoprazole (PROTONIX) 40 MG tablet Take 40 mg by mouth as needed.    . rivaroxaban (XARELTO) 20 MG TABS tablet Take 1 tablet (20 mg total) by mouth daily with supper. Resume from 11/01/2019    . spironolactone (ALDACTONE) 25 MG tablet Take 0.5 tablets (12.5 mg total) by mouth as needed.    . digoxin 62.5 MCG TABS Take 0.0625 mg by mouth daily. (Patient not taking: No sig reported) 30 tablet 6   No current facility-administered medications for this encounter.    Allergies  Allergen Reactions  . Bee Venom Anaphylaxis  . Ivp Dye [Iodinated Diagnostic Agents] Anaphylaxis  . Codeine Nausea And Vomiting  . Entresto [Sacubitril-Valsartan]     Chest pain   . Shrimp [Shellfish Allergy] Swelling  . Jardiance [Empagliflozin] Other (See Comments)    Caused Boils     Social History   Socioeconomic History  . Marital status: Widowed    Spouse name: Not on file  . Number of children: 1  . Years of education: 14  . Highest education level: Not on file  Occupational History  . Occupation: RETIRED [employer]  Tobacco Use  . Smoking status: Current Every Day Smoker    Packs/day: 0.50    Years: 56.00    Pack years: 28.00    Types: Cigarettes  . Smokeless tobacco: Never Used  . Tobacco comment: She smokes 1/2 pack daily  Vaping Use  . Vaping Use: Never used  Substance and Sexual Activity  . Alcohol use: No    Alcohol/week: 0.0 standard drinks  . Drug use: No  . Sexual activity: Not on file  Other Topics Concern  . Not on file  Social History Narrative   Patient reports it being difficult to pay for everything due to outstanding medical bills from hospital stay last year but states she is able to keep up with basic expenses.  Patient does have some concerns with her house's condition due to a water leak- had her roof repaired last month  but has some leaking in the front which has affected her porch.  Patient owns her own car and is able to drive herself but has some concerns about the reliability  of her car- has gotten taxi to the clinic for appointment in the past when her car broke down.   Social Determinants of Health   Financial Resource Strain: Not on file  Food Insecurity: Not on file  Transportation Needs: Not on file  Physical Activity: Not on file  Stress: Not on file  Social Connections: Not on file  Intimate Partner Violence: Not on file    Family History  Problem Relation Age of Onset  . Cancer Mother        LYMPHOMA  . Heart disease Father   . Other Father        TB  . CVA Sister   . Prostate cancer Brother 70  . Diabetes Brother     ROS- All systems are reviewed and negative except as per the HPI above  Physical Exam: Vitals:   08/11/20 1127  BP: 106/74  Pulse: (!) 121  Weight: 94.3 kg  Height: 5' 7.5" (1.715 m)   Wt Readings from Last 3 Encounters:  08/11/20 94.3 kg  08/10/20 93.8 kg  08/03/20 93 kg    Labs: Lab Results  Component Value Date   NA 142 08/03/2020   K 3.8 08/03/2020   CL 111 08/03/2020   CO2 19 (L) 08/03/2020   GLUCOSE 104 (H) 08/03/2020   BUN 28 (H) 08/03/2020   CREATININE 1.39 (H) 08/03/2020   CALCIUM 9.1 08/03/2020   PHOS 3.0 11/09/2014   MG 1.9 10/29/2019   Lab Results  Component Value Date   INR 3.3 (H) 06/27/2020   Lab Results  Component Value Date   CHOL 116 11/12/2014   HDL 34 (L) 11/12/2014   LDLCALC 60 11/12/2014   TRIG 111 11/12/2014     GEN- The patient is well appearing, alert and oriented x 3 today.   Head- normocephalic, atraumatic Eyes-  Sclera clear, conjunctiva pink Ears- hearing intact Oropharynx- clear Neck- supple, no JVP Lymph- no cervical lymphadenopathy Lungs- breath sounds decreased  Heart- Rapid irregular rate and rhythm, no murmurs, rubs or gallops, PMI not laterally displaced GI- soft, NT, ND, + BS Extremities- no  clubbing, cyanosis, or edema MS- no significant deformity or atrophy Skin- no rash or lesion Psych- euthymic mood, full affect Neuro- strength and sensation are intact  EKG-afib at 121 bpm, qrs int 90 bpm, qtc 494 ms Pulse ox shows HR 120- 140 bpm   Assessment and Plan: 1. Persistent afib  Afib  with RVR  Will not add CCB for reduced  EF but will increase pm dose of Toprol to 150 in pm and 100 mg in am Pt did not feel well taking this extra dose and it did not control her v rates,  Reduce  back to 100 mg bid   Offered the ER but she deferred In the past Epic reflects that she is not an candidate for an AAD or  ablation for non complinace   2. CHA2DS2VASc score of at least 7  Continue xarleto 20 mg daily  Reminded not to miss doses, she did miss a dose 2 nights ago   3. Combined systolic/diastolic HF Weight is up a couple of pounds today  Continue  lasix daily  Dr. Lovena Le agreed to see her today at 1:30 pm for possible  AV nodal ablation with PPM    Stachia Slutsky C. Samarie Pinder, [facility] [address] [place], [place] [address] [phone]

## 2020-08-11 NOTE — Progress Notes (Signed)
HPI [name] [name] is referred today by Rudi Coco, PA-C, for evaluation of uncontrolled atrial fibrillation.  She is a very pleasant 74 year old woman who has had persistent atrial fibrillation.  She has been unable to maintain sinus rhythm.  She has been relegated to a strategy of rate control.  Over the past several months, she has had worsening atrial fibrillation despite up titration of beta-blocker therapy.  She is currently taking 250 mg of Toprol daily.  She has had problems with shortness of breath.  She has not missed her Xarelto.  The patient had been prescribed digoxin but admits to not taking this medication.  She has not had syncope.  She does not have much in the way of palpitations. Allergies  Allergen Reactions  . Bee Venom Anaphylaxis  . Ivp Dye [Iodinated Diagnostic Agents] Anaphylaxis  . Codeine Nausea And Vomiting  . Entresto [Sacubitril-Valsartan]     Chest pain   . Shrimp [Shellfish Allergy] Swelling  . Jardiance [Empagliflozin] Other (See Comments)    Caused Boils      Current Outpatient Medications  Medication Sig Dispense Refill  . acetaminophen (TYLENOL) 500 MG tablet Take 500 mg by mouth every 6 (six) hours as needed for moderate pain.     Marland Kitchen allopurinol (ZYLOPRIM) 300 MG tablet Take 1 tablet (300 mg total) by mouth daily. 30 tablet 11  . atorvastatin (LIPITOR) 10 MG tablet Take 5-10 mg by mouth at bedtime.     . Coenzyme Q10 (CO Q-10 PO) Take 1 capsule by mouth daily.     . digoxin (LANOXIN) 0.125 MG tablet Take 0.5 tablets (0.0625 mg total) by mouth daily. 45 tablet 3  . famotidine (PEPCID) 20 MG tablet Take 20 mg by mouth daily.    . furosemide (LASIX) 20 MG tablet Take 2 tablets (40 mg total) by mouth daily. 90 tablet 2  . losartan (COZAAR) 25 MG tablet Take 1 tablet (25 mg total) by mouth 2 (two) times daily. 60 tablet 6  . metoprolol succinate (TOPROL-XL) 100 MG 24 hr tablet Take 1 tablet in the AM and 1.5 tablet in the PM 180 tablet 3  .  pantoprazole (PROTONIX) 40 MG tablet Take 40 mg by mouth as needed.    . rivaroxaban (XARELTO) 20 MG TABS tablet Take 1 tablet (20 mg total) by mouth daily with supper. Resume from 11/01/2019    . spironolactone (ALDACTONE) 25 MG tablet Take 0.5 tablets (12.5 mg total) by mouth as needed.     No current facility-administered medications for this visit.     Past Medical History:  Diagnosis Date  . (HFimpEF) heart failure with improved ejection fraction (HCC)    a. 07/2018 Echo: EF 30-35%; b. 11/2019 Echo: EF 55-60%, no rwma, Gr2 DD, Nl RV size/fxn. Mild BAE. Mild MR/AI.  Marland Kitchen Arthritis   . Asthma    ??  . Atopic dermatitis   . CAD (coronary artery disease)    a. 2016 s/p CABG x 2 (LIMA->LAD, VG->OM); b. 08/2018 MV: EF 44%, no ischemia/infact.  . Cardiac arrest (HCC) 10/2014  . Cardiomyopathy, ischemic    a. 07/2018 Echo: EF 30-35%; 11/2019 Echo: EF 55-60%.  . Carotid arterial disease (HCC)    a. 11/2019 Carotid U/S  . CKD (chronic kidney disease), stage III (HCC)   . COPD (chronic obstructive pulmonary disease) (HCC)   . Esophageal dilatation 2013  . GERD (gastroesophageal reflux disease)   . Gout   . Headache   . Hypertension   .  Hypokalemia   . Idiopathic angioedema   . LGI bleed 08/06/2017   a. felt to be hemorrhoidal during that admission (no drop in Hgb).  . Lower back pain   . Paroxysmal atrial fibrillation ([place])    a. Dx 2016-->h/o difficult to control rates (complicated by noncompliance), not felt to be a candidate for ablation or antiarrhythmic due to noncompliance; b. Recurrent AF 2021 - converted w/ IV dilt; c. CHA2DS2VASc = 7-->Xarelto.  . Personal history of noncompliance with medical treatment, presenting hazards to health   . Prediabetes   . S/P CABG x 2 with clipping of LA appendage 11/14/2014   LIMA to LAD, SVG to OM, EVH via right thigh  . Urine incontinence   . Uterine fibroid     ROS:   All systems reviewed and negative except as noted in the HPI.   Past  Surgical History:  Procedure Laterality Date  . BALLOON DILATION N/A 10/10/2019   Procedure: BALLOON DILATION;  Surgeon: Otis Brace, MD;  [facility];  Service: Gastroenterology;  Laterality: N/A;  . BIOPSY  10/10/2019   Procedure: BIOPSY;  Surgeon: Otis Brace, MD;  Location: [place];  Service: Gastroenterology;;  . BIOPSY  10/11/2019   Procedure: BIOPSY;  Surgeon: Otis Brace, MD;  Location: [place];  Service: Gastroenterology;;  . CARDIOVERSION N/A 11/18/2014   Procedure: CARDIOVERSION;  Surgeon: [name], MD;  Location: [place];  Service: Cardiovascular;  Laterality: N/A;  . CARDIOVERSION N/A 12/25/2017   Procedure: CARDIOVERSION;  Surgeon: [name], MD;  Location: [place];  Service: Cardiovascular;  Laterality: N/A;  . CLIPPING OF ATRIAL APPENDAGE N/A 11/14/2014   Procedure: CLIPPING OF ATRIAL APPENDAGE;  Surgeon: [name], MD;  Location: [place];  Service: Open Heart Surgery;  Laterality: N/A;  . COLONOSCOPY  2013  . COLONOSCOPY WITH PROPOFOL N/A 10/11/2019   Procedure: COLONOSCOPY WITH PROPOFOL;  Surgeon: Otis Brace, MD;  Location: [place];  Service: Gastroenterology;  Laterality: N/A;  . CORONARY ARTERY BYPASS GRAFT N/A 11/14/2014   Procedure: CORONARY ARTERY BYPASS GRAFTING (CABG)TIMES 2 USING LEFT INTERNAL MAMMARY ARTERY AND RIGHT SAPHENOUS VEIN HARVESTED ENDOSCOPICALLY;  Surgeon: [name], MD;  Location: [place];  Service: Open Heart Surgery;  Laterality: N/A;  . ESOPHAGOGASTRODUODENOSCOPY (EGD) WITH PROPOFOL N/A 10/10/2019   Procedure: ESOPHAGOGASTRODUODENOSCOPY (EGD) WITH PROPOFOL;  Surgeon: Otis Brace, MD;  [facility];  Service: Gastroenterology;  Laterality: N/A;  . ESOPHAGOGASTRODUODENOSCOPY (EGD) WITH PROPOFOL N/A 10/27/2019   Procedure: ESOPHAGOGASTRODUODENOSCOPY (EGD) WITH PROPOFOL;  Surgeon: Ronnette Juniper, MD;  Location: [place];  Service: Gastroenterology;  Laterality: N/A;  .  GIVENS CAPSULE STUDY N/A 10/27/2019   Procedure: GIVENS CAPSULE STUDY;  Surgeon: Ronnette Juniper, MD;  Location: [place];  Service: Gastroenterology;  Laterality: N/A;  . LEFT HEART CATHETERIZATION WITH CORONARY ANGIOGRAM N/A 11/04/2014   Procedure: LEFT HEART CATHETERIZATION WITH CORONARY ANGIOGRAM;  Surgeon: Troy Sine, MD;  Location: [facility] CATH LAB;  Service: Cardiovascular;  Laterality: N/A;  . POLYPECTOMY  10/11/2019   Procedure: POLYPECTOMY;  Surgeon: Otis Brace, MD;  Location: [place] ENDOSCOPY;  Service: Gastroenterology;;  . TEE WITHOUT CARDIOVERSION N/A 11/14/2014   Procedure: TRANSESOPHAGEAL ECHOCARDIOGRAM (TEE);  Surgeon: [name], MD;  Location: [place];  Service: Open Heart Surgery;  Laterality: N/A;  . TEE WITHOUT CARDIOVERSION N/A 12/25/2017   Procedure: TRANSESOPHAGEAL ECHOCARDIOGRAM (TEE);  Surgeon: [name], MD;  Location: [facility] ENDOSCOPY;  Service: Cardiovascular;  Laterality: N/A;  . TEMPORARY PACEMAKER INSERTION  11/04/2014  Procedure: TEMPORARY PACEMAKER INSERTION;  Surgeon: Troy Sine, MD;  Location: [facility] CATH LAB;  Service: Cardiovascular;;  . TOOTH EXTRACTION       Family History  Problem Relation Age of Onset  . Cancer Mother        LYMPHOMA  . Heart disease Father   . Other Father        TB  . CVA Sister   . Prostate cancer Brother 6  . Diabetes Brother      Social History   Socioeconomic History  . Marital status: Widowed    Spouse name: Not on file  . Number of children: 1  . Years of education: 2  . Highest education level: Not on file  Occupational History  . Occupation: RETIRED [employer]  Tobacco Use  . Smoking status: Current Every Day Smoker    Packs/day: 0.50    Years: 56.00    Pack years: 28.00    Types: Cigarettes  . Smokeless tobacco: Never Used  . Tobacco comment: She smokes 1/2 pack daily  Vaping Use  . Vaping Use: Never used  Substance and Sexual Activity  . Alcohol use: No    Alcohol/week: 0.0  standard drinks  . Drug use: No  . Sexual activity: Not on file  Other Topics Concern  . Not on file  Social History Narrative   Patient reports it being difficult to pay for everything due to outstanding medical bills from hospital stay last year but states she is able to keep up with basic expenses.  Patient does have some concerns with her house's condition due to a water leak- had her roof repaired last month but has some leaking in the front which has affected her porch.  Patient owns her own car and is able to drive herself but has some concerns about the reliability of her car- has gotten taxi to the clinic for appointment in the past when her car broke down.   Social Determinants of Health   Financial Resource Strain: Not on file  Food Insecurity: Not on file  Transportation Needs: Not on file  Physical Activity: Not on file  Stress: Not on file  Social Connections: Not on file  Intimate Partner Violence: Not on file     BP 98/60   Pulse (!) 115   Ht 5' 7.5" (1.715 m)   Wt 206 lb (93.4 kg)   SpO2 96%   BMI 31.79 kg/m   Physical Exam:  Well appearing NAD HEENT: Unremarkable Neck:  No JVD, no thyromegally Lymphatics:  No adenopathy Back:  No CVA tenderness Lungs:  Clear, with no wheezes, rales, or rhonchi HEART:  IRegular rate rhythm, no murmurs, no rubs, no clicks Abd:  soft, positive bowel sounds, no organomegally, no rebound, no guarding Ext:  2 plus pulses, no edema, no cyanosis, no clubbing Skin:  No rashes no nodules Neuro:  CN II through XII intact, motor grossly intact  EKG -reviewed, atrial fibrillation with a rapid ventricular response.  Assess/Plan: 1.  Uncontrolled atrial fibrillation.  The patient has worsening symptoms.  She has not been taking her digoxin.  I recommended AV node ablation and pacemaker insertion for rate control.  The patient would prefer to initially undergo a trial of digoxin in conjunction with her high-dose beta-blocker therapy.   If her ventricular rate cannot be controlled with the addition of digoxin, she is willing to consider AV node ablation and pacemaker insertion. 2.  Chronic diastolic heart failure -her symptoms  are class IIIb in atrial fibrillation.  Hopefully we can control her rates and regularize her rhythm either with additional AV nodal blocking drugs or pacemaker and AV node ablation.  She is encouraged to maintain a low-sodium diet.  Her blood pressure today is a bit soft.  Hopefully it will improve with improvement in her rate control.  [name], MD

## 2020-08-11 NOTE — Patient Instructions (Addendum)
Medication Instructions:  Your physician has recommended you make the following change in your medication:   1.  START taking digoxin .0125 mg-  TAKE 1/2 tablet by mouth daily  Labwork: None ordered.  Testing/Procedures: None ordered.  Follow-Up:  You will come to the [facility] office in 2 weeks for an EKG on August 24, 2020 at 11:00 am   Any Other Special Instructions Will Be Listed Below (If Applicable).  If you need a refill on your cardiac medications before your next appointment, please call your pharmacy.

## 2020-08-12 ENCOUNTER — Other Ambulatory Visit (HOSPITAL_COMMUNITY): Payer: HMO

## 2020-08-13 ENCOUNTER — Other Ambulatory Visit: Payer: Self-pay

## 2020-08-13 ENCOUNTER — Ambulatory Visit (HOSPITAL_COMMUNITY): Payer: HMO | Admitting: Nurse Practitioner

## 2020-08-13 ENCOUNTER — Inpatient Hospital Stay (HOSPITAL_COMMUNITY)
Admission: EM | Admit: 2020-08-13 | Discharge: 2020-08-15 | DRG: 308 | Disposition: A | Discharge status: Home / Self Care | Payer: HMO | Attending: Cardiology | Admitting: Cardiology

## 2020-08-13 ENCOUNTER — Encounter (HOSPITAL_COMMUNITY): Payer: Self-pay | Admitting: Physician Assistant

## 2020-08-13 ENCOUNTER — Emergency Department (HOSPITAL_COMMUNITY): Payer: HMO

## 2020-08-13 DIAGNOSIS — R0602 Shortness of breath: Secondary | ICD-10-CM | POA: Diagnosis not present

## 2020-08-13 DIAGNOSIS — I10 Essential (primary) hypertension: Secondary | ICD-10-CM | POA: Diagnosis present

## 2020-08-13 DIAGNOSIS — Z91013 Allergy to seafood: Secondary | ICD-10-CM

## 2020-08-13 DIAGNOSIS — K219 Gastro-esophageal reflux disease without esophagitis: Secondary | ICD-10-CM | POA: Diagnosis present

## 2020-08-13 DIAGNOSIS — I252 Old myocardial infarction: Secondary | ICD-10-CM | POA: Diagnosis not present

## 2020-08-13 DIAGNOSIS — Z8674 Personal history of sudden cardiac arrest: Secondary | ICD-10-CM | POA: Diagnosis not present

## 2020-08-13 DIAGNOSIS — R000 Tachycardia, unspecified: Secondary | ICD-10-CM | POA: Diagnosis not present

## 2020-08-13 DIAGNOSIS — M109 Gout, unspecified: Secondary | ICD-10-CM | POA: Diagnosis not present

## 2020-08-13 DIAGNOSIS — I2510 Atherosclerotic heart disease of native coronary artery without angina pectoris: Secondary | ICD-10-CM | POA: Diagnosis not present

## 2020-08-13 DIAGNOSIS — E669 Obesity, unspecified: Secondary | ICD-10-CM | POA: Diagnosis present

## 2020-08-13 DIAGNOSIS — Z888 Allergy status to other drugs, medicaments and biological substances status: Secondary | ICD-10-CM

## 2020-08-13 DIAGNOSIS — Z885 Allergy status to narcotic agent status: Secondary | ICD-10-CM

## 2020-08-13 DIAGNOSIS — Z951 Presence of aortocoronary bypass graft: Secondary | ICD-10-CM

## 2020-08-13 DIAGNOSIS — I2583 Coronary atherosclerosis due to lipid rich plaque: Secondary | ICD-10-CM | POA: Diagnosis not present

## 2020-08-13 DIAGNOSIS — Z91030 Bee allergy status: Secondary | ICD-10-CM

## 2020-08-13 DIAGNOSIS — Z20822 Contact with and (suspected) exposure to covid-19: Secondary | ICD-10-CM | POA: Diagnosis not present

## 2020-08-13 DIAGNOSIS — J449 Chronic obstructive pulmonary disease, unspecified: Secondary | ICD-10-CM | POA: Diagnosis present

## 2020-08-13 DIAGNOSIS — Z8042 Family history of malignant neoplasm of prostate: Secondary | ICD-10-CM

## 2020-08-13 DIAGNOSIS — N183 Chronic kidney disease, stage 3 unspecified: Secondary | ICD-10-CM | POA: Diagnosis not present

## 2020-08-13 DIAGNOSIS — I5031 Acute diastolic (congestive) heart failure: Secondary | ICD-10-CM

## 2020-08-13 DIAGNOSIS — Z807 Family history of other malignant neoplasms of lymphoid, hematopoietic and related tissues: Secondary | ICD-10-CM

## 2020-08-13 DIAGNOSIS — I255 Ischemic cardiomyopathy: Secondary | ICD-10-CM | POA: Diagnosis not present

## 2020-08-13 DIAGNOSIS — I5043 Acute on chronic combined systolic (congestive) and diastolic (congestive) heart failure: Secondary | ICD-10-CM | POA: Diagnosis present

## 2020-08-13 DIAGNOSIS — I4891 Unspecified atrial fibrillation: Secondary | ICD-10-CM | POA: Diagnosis present

## 2020-08-13 DIAGNOSIS — I130 Hypertensive heart and chronic kidney disease with heart failure and stage 1 through stage 4 chronic kidney disease, or unspecified chronic kidney disease: Secondary | ICD-10-CM | POA: Diagnosis present

## 2020-08-13 DIAGNOSIS — F17210 Nicotine dependence, cigarettes, uncomplicated: Secondary | ICD-10-CM | POA: Diagnosis not present

## 2020-08-13 DIAGNOSIS — K625 Hemorrhage of anus and rectum: Secondary | ICD-10-CM | POA: Diagnosis not present

## 2020-08-13 DIAGNOSIS — Z91041 Radiographic dye allergy status: Secondary | ICD-10-CM

## 2020-08-13 DIAGNOSIS — Z6831 Body mass index (BMI) 31.0-31.9, adult: Secondary | ICD-10-CM

## 2020-08-13 DIAGNOSIS — Z833 Family history of diabetes mellitus: Secondary | ICD-10-CM

## 2020-08-13 DIAGNOSIS — E785 Hyperlipidemia, unspecified: Secondary | ICD-10-CM | POA: Diagnosis present

## 2020-08-13 DIAGNOSIS — E1122 Type 2 diabetes mellitus with diabetic chronic kidney disease: Secondary | ICD-10-CM | POA: Diagnosis present

## 2020-08-13 DIAGNOSIS — I4819 Other persistent atrial fibrillation: Principal | ICD-10-CM | POA: Diagnosis present

## 2020-08-13 DIAGNOSIS — Z8249 Family history of ischemic heart disease and other diseases of the circulatory system: Secondary | ICD-10-CM

## 2020-08-13 DIAGNOSIS — J811 Chronic pulmonary edema: Secondary | ICD-10-CM | POA: Diagnosis not present

## 2020-08-13 DIAGNOSIS — Z823 Family history of stroke: Secondary | ICD-10-CM

## 2020-08-13 LAB — BRAIN NATRIURETIC PEPTIDE: B Natriuretic Peptide: 864.3 pg/mL — ABNORMAL HIGH (ref 0.0–100.0)

## 2020-08-13 LAB — CBC WITH DIFFERENTIAL/PLATELET
Abs Immature Granulocytes: 0.03 10*3/uL (ref 0.00–0.07)
Basophils Absolute: 0.1 10*3/uL (ref 0.0–0.1)
Basophils Relative: 1 %
Eosinophils Absolute: 0.1 10*3/uL (ref 0.0–0.5)
Eosinophils Relative: 1 %
HCT: 42.8 % (ref 36.0–46.0)
Hemoglobin: 13.5 g/dL (ref 12.0–15.0)
Immature Granulocytes: 0 %
Lymphocytes Relative: 21 %
Lymphs Abs: 2.1 10*3/uL (ref 0.7–4.0)
MCH: 26.7 pg (ref 26.0–34.0)
MCHC: 31.5 g/dL (ref 30.0–36.0)
MCV: 84.8 fL (ref 80.0–100.0)
Monocytes Absolute: 0.9 10*3/uL (ref 0.1–1.0)
Monocytes Relative: 8 %
Neutro Abs: 7 10*3/uL (ref 1.7–7.7)
Neutrophils Relative %: 69 %
Platelets: 153 10*3/uL (ref 150–400)
RBC: 5.05 MIL/uL (ref 3.87–5.11)
RDW: 18.6 % — ABNORMAL HIGH (ref 11.5–15.5)
WBC: 10.1 10*3/uL (ref 4.0–10.5)
nRBC: 0.2 % (ref 0.0–0.2)

## 2020-08-13 LAB — BASIC METABOLIC PANEL
Anion gap: 10 (ref 5–15)
BUN: 43 mg/dL — ABNORMAL HIGH (ref 8–23)
CO2: 17 mmol/L — ABNORMAL LOW (ref 22–32)
Calcium: 8.7 mg/dL — ABNORMAL LOW (ref 8.9–10.3)
Chloride: 113 mmol/L — ABNORMAL HIGH (ref 98–111)
Creatinine, Ser: 1.46 mg/dL — ABNORMAL HIGH (ref 0.44–1.00)
GFR, Estimated: 37 mL/min — ABNORMAL LOW (ref >60)
Glucose, Bld: 110 mg/dL — ABNORMAL HIGH (ref 70–99)
Potassium: 3.8 mmol/L (ref 3.5–5.1)
Sodium: 140 mmol/L (ref 135–145)

## 2020-08-13 LAB — HEPATIC FUNCTION PANEL
ALT: 41 U/L (ref 0–44)
AST: 34 U/L (ref 15–41)
Albumin: 3.1 g/dL — ABNORMAL LOW (ref 3.5–5.0)
Alkaline Phosphatase: 187 U/L — ABNORMAL HIGH (ref 38–126)
Bilirubin, Direct: 0.3 mg/dL — ABNORMAL HIGH (ref 0.0–0.2)
Indirect Bilirubin: 0.9 mg/dL (ref 0.3–0.9)
Total Bilirubin: 1.2 mg/dL (ref 0.3–1.2)
Total Protein: 6 g/dL — ABNORMAL LOW (ref 6.5–8.1)

## 2020-08-13 LAB — RESP PANEL BY RT-PCR (FLU A&B, COVID) ARPGX2
Influenza A by PCR: NEGATIVE
Influenza B by PCR: NEGATIVE
SARS Coronavirus 2 by RT PCR: NEGATIVE

## 2020-08-13 LAB — DIGOXIN LEVEL: Digoxin Level: <0.2 ng/mL — ABNORMAL LOW (ref 0.8–2.0)

## 2020-08-13 LAB — TSH
TSH: 1.532 u[IU]/mL (ref 0.350–4.500)
TSH: 1.246 u[IU]/mL (ref 0.350–4.500)

## 2020-08-13 LAB — PROTIME-INR
INR: 2.2 — ABNORMAL HIGH (ref 0.8–1.2)
Prothrombin Time: 23.8 seconds — ABNORMAL HIGH (ref 11.4–15.2)

## 2020-08-13 LAB — HEMOGLOBIN A1C
Hgb A1c MFr Bld: 6.6 % — ABNORMAL HIGH (ref 4.8–5.6)
Mean Plasma Glucose: 142.72 mg/dL

## 2020-08-13 LAB — TROPONIN I (HIGH SENSITIVITY): Troponin I (High Sensitivity): 24 ng/L — ABNORMAL HIGH (ref <18)

## 2020-08-13 LAB — MAGNESIUM: Magnesium: 2 mg/dL (ref 1.7–2.4)

## 2020-08-13 MED ORDER — ZOLPIDEM TARTRATE 5 MG PO TABS: 5.0000 mg | ORAL_TABLET | Freq: Every evening | ORAL | Status: DC | PRN

## 2020-08-13 MED ORDER — ACETAMINOPHEN 325 MG PO TABS
650.0000 mg | ORAL_TABLET | ORAL | Status: DC | PRN
  Administered 2020-08-14 – 2020-08-15 (×2): 650 mg via ORAL
  Filled 2020-08-13 (×2): qty 2

## 2020-08-13 MED ORDER — METOPROLOL SUCCINATE ER 25 MG PO TB24
50.0000 mg | ORAL_TABLET | Freq: Every day | ORAL | Status: DC
  Administered 2020-08-13: 50 mg via ORAL
  Filled 2020-08-13: qty 2

## 2020-08-13 MED ORDER — SODIUM CHLORIDE 0.9 % IV SOLN: 250.0000 mL | INTRAVENOUS | Status: DC | PRN

## 2020-08-13 MED ORDER — ONDANSETRON HCL 4 MG/2ML IJ SOLN: 4.0000 mg | Freq: Four times a day (QID) | INTRAMUSCULAR | Status: DC | PRN

## 2020-08-13 MED ORDER — METOPROLOL SUCCINATE ER 100 MG PO TB24: 150.0000 mg | ORAL_TABLET | Freq: Every day | ORAL | Status: DC

## 2020-08-13 MED ORDER — METOPROLOL SUCCINATE ER 100 MG PO TB24: 100.0000 mg | ORAL_TABLET | Freq: Two times a day (BID) | ORAL | Status: DC

## 2020-08-13 MED ORDER — SODIUM CHLORIDE 0.9% FLUSH: 3.0000 mL | INTRAVENOUS | Status: DC | PRN

## 2020-08-13 MED ORDER — DIGOXIN 125 MCG PO TABS
0.0625 mg | ORAL_TABLET | Freq: Every day | ORAL | Status: DC
  Administered 2020-08-14 – 2020-08-15 (×2): 0.0625 mg via ORAL
  Filled 2020-08-13 (×2): qty 1

## 2020-08-13 MED ORDER — DIGOXIN 0.25 MG/ML IJ SOLN
0.5000 mg | Freq: Once | INTRAMUSCULAR | Status: AC
  Administered 2020-08-13: 0.5 mg via INTRAVENOUS
  Filled 2020-08-13: qty 2

## 2020-08-13 MED ORDER — ALPRAZOLAM 0.25 MG PO TABS: 0.2500 mg | ORAL_TABLET | Freq: Two times a day (BID) | ORAL | Status: DC | PRN

## 2020-08-13 MED ORDER — SODIUM CHLORIDE 0.9% FLUSH
3.0000 mL | Freq: Two times a day (BID) | INTRAVENOUS | Status: DC
  Administered 2020-08-14 – 2020-08-15 (×3): 3 mL via INTRAVENOUS

## 2020-08-13 MED ORDER — FAMOTIDINE 20 MG PO TABS
20.0000 mg | ORAL_TABLET | Freq: Every day | ORAL | Status: DC
  Administered 2020-08-14 – 2020-08-15 (×2): 20 mg via ORAL
  Filled 2020-08-13 (×2): qty 1

## 2020-08-13 MED ORDER — NITROGLYCERIN 0.4 MG SL SUBL: 0.4000 mg | SUBLINGUAL_TABLET | SUBLINGUAL | Status: DC | PRN

## 2020-08-13 MED ORDER — METOPROLOL SUCCINATE ER 100 MG PO TB24: 100.0000 mg | ORAL_TABLET | Freq: Every day | ORAL | Status: DC

## 2020-08-13 MED ORDER — METOPROLOL SUCCINATE ER 100 MG PO TB24
100.0000 mg | ORAL_TABLET | Freq: Every day | ORAL | Status: DC
  Administered 2020-08-14: 100 mg via ORAL
  Filled 2020-08-13: qty 1

## 2020-08-13 MED ORDER — CO Q-10 100 MG PO CAPS: 1.0000 | ORAL_CAPSULE | Freq: Every day | ORAL | Status: DC

## 2020-08-13 MED ORDER — RIVAROXABAN 20 MG PO TABS
20.0000 mg | ORAL_TABLET | Freq: Every day | ORAL | Status: DC
  Administered 2020-08-14: 20 mg via ORAL
  Filled 2020-08-13 (×2): qty 1

## 2020-08-13 MED ORDER — FUROSEMIDE 10 MG/ML IJ SOLN
40.0000 mg | Freq: Once | INTRAMUSCULAR | Status: AC
  Administered 2020-08-13: 40 mg via INTRAVENOUS
  Filled 2020-08-13: qty 4

## 2020-08-13 MED ORDER — ATORVASTATIN CALCIUM 10 MG PO TABS
10.0000 mg | ORAL_TABLET | Freq: Every day | ORAL | Status: DC
  Administered 2020-08-13 – 2020-08-14 (×2): 10 mg via ORAL
  Filled 2020-08-13 (×2): qty 1

## 2020-08-13 NOTE — ED Provider Notes (Signed)
[facility] EMERGENCY DEPARTMENT Provider Note   CSN: [id number] Arrival date & time: 08/13/20  1658     History Chief Complaint  Patient presents with  . Shortness of Breath    [name] [name] is a 74 y.o. female.  HPI Patient is a 74 year old female with a history of HFrEF, CAD, CKD, COPD, GERD, atrial fibrillation who presents to ED for complaint of dyspnea.  Patient states she has been feeling short of breath all week. Symptoms are worsening. She has been seen by cardiology several times over the course of this week, but last night became very orthopneic and wanted to be evaluated.  Patient also endorses small amount of dark rectal bleeding starting 3 or 4 days ago.  Notes recent changes to her medications including recently stopping Jardiance due to GU rash.    Past Medical History:  Diagnosis Date  . (HFimpEF) heart failure with improved ejection fraction ([place])    a. 07/2018 Echo: EF 30-35%; b. 11/2019 Echo: EF 55-60%, no rwma, Gr2 DD, Nl RV size/fxn. Mild BAE. Mild MR/AI.  Marland Kitchen Arthritis   . Asthma    ??  . Atopic dermatitis   . CAD (coronary artery disease)    a. 2016 s/p CABG x 2 (LIMA->LAD, VG->OM); b. 08/2018 MV: EF 44%, no ischemia/infact.  . Cardiac arrest ([place]) 10/2014  . Cardiomyopathy, ischemic    a. 07/2018 Echo: EF 30-35%; 11/2019 Echo: EF 55-60%.  . Carotid arterial disease ([place])    a. 11/2019 Carotid U/S  . CKD (chronic kidney disease), stage III ([place])   . COPD (chronic obstructive pulmonary disease) ([place])   . Esophageal dilatation 2013  . GERD (gastroesophageal reflux disease)   . Gout   . Headache   . Hypertension   . Hypokalemia   . Idiopathic angioedema   . LGI bleed 08/06/2017   a. felt to be hemorrhoidal during that admission (no drop in Hgb).  . Lower back pain   . Paroxysmal atrial fibrillation ([place])    a. Dx 2016-->h/o difficult to control rates (complicated by noncompliance), not felt to be a candidate for ablation or  antiarrhythmic due to noncompliance; b. Recurrent AF 2021 - converted w/ IV dilt; c. CHA2DS2VASc = 7-->Xarelto.  . Personal history of noncompliance with medical treatment, presenting hazards to health   . Prediabetes   . S/P CABG x 2 with clipping of LA appendage 11/14/2014   LIMA to LAD, SVG to OM, EVH via right thigh  . Urine incontinence   . Uterine fibroid     Patient Active Problem List   Diagnosis Date Noted  . Acute lower GI bleeding 02/10/2020  . Tobacco dependence 02/10/2020  . Obesity, Class II, BMI 35-39.9 02/10/2020  . Acute upper GI bleeding 10/26/2019  . COPD (chronic obstructive pulmonary disease) ([place]) 10/26/2019  . Acute GI bleeding 10/09/2019  . Acute blood loss anemia 10/09/2019  . Postmenopausal vaginal bleeding 01/04/2018  . Snoring 01/04/2018  . Acute on chronic combined systolic and diastolic CHF (congestive heart failure) ([place])   . CHF exacerbation ([place]) 10/16/2017  . Noncompliance 10/16/2017  . Acute on chronic congestive heart failure ([place])   . Thrombocytopenia ([place]) 09/14/2017  . Acute drug-induced gout of right foot   . Medication noncompliance due to cognitive impairment 09/06/2017  . Acute diastolic CHF (congestive heart failure) ([place]) 08/06/2017  . LGI bleed, likely hemorrhoids 08/06/2017  . Paroxysmal atrial fibrillation ([place]) 02/08/2017  . Cardiomyopathy, ischemic 02/08/2017  . Dysphagia 02/08/2017  .  CKD (chronic kidney disease), stage III ([place]) 01/30/2017  . Pain in shoulder 02/08/2016  . Breast pain, left 02/08/2016  . Bilateral arm numbness and tingling while sleeping 02/08/2016  . Painful lumpy left breast 09/23/2015  . [place] intertrigo 02/11/2015  . S/P CABG x 2 11/14/2014  . Accelerated hypertension   . Cardiac arrest ([place]) 11/04/2014  . Left main coronary artery disease 11/04/2014  . Coronary artery disease due to lipid rich plaque   . Acute respiratory failure with hypoxemia ([place])   . Essential hypertension   . Atrial  fibrillation with RVR ([place])   . Hypokalemia 10/30/2014  . Chronic combined systolic and diastolic CHF (congestive heart failure) ([place]) 10/30/2014  . NSTEMI (non-ST elevated myocardial infarction) ([place])   . DOE (dyspnea on exertion)   . CAP (community acquired pneumonia) 10/29/2014    Past Surgical History:  Procedure Laterality Date  . BALLOON DILATION N/A 10/10/2019   Procedure: BALLOON DILATION;  Surgeon: Otis Brace, MD;  Location: [facility];  Service: Gastroenterology;  Laterality: N/A;  . BIOPSY  10/10/2019   Procedure: BIOPSY;  Surgeon: Otis Brace, MD;  Location: [facility];  Service: Gastroenterology;;  . BIOPSY  10/11/2019   Procedure: BIOPSY;  Surgeon: Otis Brace, MD;  Location: [facility];  Service: Gastroenterology;;  . CARDIOVERSION N/A 11/18/2014   Procedure: CARDIOVERSION;  Surgeon: [name], MD;  Location: [facility];  Service: Cardiovascular;  Laterality: N/A;  . CARDIOVERSION N/A 12/25/2017   Procedure: CARDIOVERSION;  Surgeon: [name], MD;  Location: [facility];  Service: Cardiovascular;  Laterality: N/A;  . CLIPPING OF ATRIAL APPENDAGE N/A 11/14/2014   Procedure: CLIPPING OF ATRIAL APPENDAGE;  Surgeon: [name], MD;  Location: [facility];  Service: Open Heart Surgery;  Laterality: N/A;  . COLONOSCOPY  2013  . COLONOSCOPY WITH PROPOFOL N/A 10/11/2019   Procedure: COLONOSCOPY WITH PROPOFOL;  Surgeon: Otis Brace, MD;  Location: [facility];  Service: Gastroenterology;  Laterality: N/A;  . CORONARY ARTERY BYPASS GRAFT N/A 11/14/2014   Procedure: CORONARY ARTERY BYPASS GRAFTING (CABG)TIMES 2 USING LEFT INTERNAL MAMMARY ARTERY AND RIGHT SAPHENOUS VEIN HARVESTED ENDOSCOPICALLY;  Surgeon: [name], MD;  Location: [facility];  Service: Open Heart Surgery;  Laterality: N/A;  . ESOPHAGOGASTRODUODENOSCOPY (EGD) WITH PROPOFOL N/A 10/10/2019   Procedure: ESOPHAGOGASTRODUODENOSCOPY (EGD) WITH PROPOFOL;  Surgeon: Otis Brace, MD;   [facility]: [facility];  Service: Gastroenterology;  Laterality: N/A;  . ESOPHAGOGASTRODUODENOSCOPY (EGD) WITH PROPOFOL N/A 10/27/2019   Procedure: ESOPHAGOGASTRODUODENOSCOPY (EGD) WITH PROPOFOL;  Surgeon: Ronnette Juniper, MD;  Location: [facility];  Service: Gastroenterology;  Laterality: N/A;  . GIVENS CAPSULE STUDY N/A 10/27/2019   Procedure: GIVENS CAPSULE STUDY;  Surgeon: Ronnette Juniper, MD;  Location: [facility];  Service: Gastroenterology;  Laterality: N/A;  . LEFT HEART CATHETERIZATION WITH CORONARY ANGIOGRAM N/A 11/04/2014   Procedure: LEFT HEART CATHETERIZATION WITH CORONARY ANGIOGRAM;  Surgeon: Troy Sine, MD;  Location: [facility] CATH LAB;  Service: Cardiovascular;  Laterality: N/A;  . POLYPECTOMY  10/11/2019   Procedure: POLYPECTOMY;  Surgeon: Otis Brace, MD;  Location: [facility]OSCOPY;  Service: Gastroenterology;;  . TEE WITHOUT CARDIOVERSION N/A 11/14/2014   Procedure: TRANSESOPHAGEAL ECHOCARDIOGRAM (TEE);  Surgeon: [name], MD;  Location: [facility];  Service: Open Heart Surgery;  Laterality: N/A;  . TEE WITHOUT CARDIOVERSION N/A 12/25/2017   Procedure: TRANSESOPHAGEAL ECHOCARDIOGRAM (TEE);  Surgeon: [name], MD;  Location: [facility] ENDOSCOPY;  Service: Cardiovascular;  Laterality: N/A;  . TEMPORARY PACEMAKER INSERTION  11/04/2014   Procedure: TEMPORARY PACEMAKER  INSERTION;  Surgeon: Lennette Bihari, MD;  Location[facility] LAB;  Service: Cardiovascular;;  . TOOTH EXTRACTION       OB History   No obstetric history on file.     Family History  Problem Relation Age of Onset  . Cancer Mother        LYMPHOMA  . Heart disease Father   . Other Father        TB  . CVA Sister   . Prostate cancer Brother 36  . Diabetes Brother     Social History   Tobacco Use  . Smoking status: Current Every Day Smoker    Packs/day: 0.50    Years: 56.00    Pack years: 28.00    Types: Cigarettes  . Smokeless tobacco: Never Used  . Tobacco comment: She smokes 1/2 pack daily  Vaping Use   . Vaping Use: Never used  Substance Use Topics  . Alcohol use: No    Alcohol/week: 0.0 standard drinks  . Drug use: No    Home Medications Prior to Admission medications   Medication Sig Start Date End Date Taking? Authorizing Provider  acetaminophen (TYLENOL) 500 MG tablet Take 500 mg by mouth every 6 (six) hours as needed for moderate pain.     [provider]  allopurinol (ZYLOPRIM) 300 MG tablet Take 1 tablet (300 mg total) by mouth daily. 01/04/18   Alford Highland, NP  atorvastatin (LIPITOR) 10 MG tablet Take 5-10 mg by mouth at bedtime.     [provider]  Coenzyme Q10 (CO Q-10 PO) Take 1 capsule by mouth daily.     [provider]  digoxin (LANOXIN) 0.125 MG tablet Take 0.5 tablets (0.0625 mg total) by mouth daily. 08/11/20   [name], MD  famotidine (PEPCID) 20 MG tablet Take 20 mg by mouth daily.    [provider]  furosemide (LASIX) 20 MG tablet Take 2 tablets (40 mg total) by mouth daily. 06/29/20   Kroeger, Ovidio Kin., PA-C  losartan (COZAAR) 25 MG tablet Take 1 tablet (25 mg total) by mouth 2 (two) times daily. 06/29/20   Kroeger, Ovidio Kin., PA-C  metoprolol succinate (TOPROL-XL) 100 MG 24 hr tablet Take 1 tablet in the AM and 1.5 tablet in the PM 08/10/20   Newman Nip, NP  pantoprazole (PROTONIX) 40 MG tablet Take 40 mg by mouth as needed.    [provider]  rivaroxaban (XARELTO) 20 MG TABS tablet Take 1 tablet (20 mg total) by mouth daily with supper. Resume from 11/01/2019 11/01/19   Glade Lloyd, MD  spironolactone (ALDACTONE) 25 MG tablet Take 0.5 tablets (12.5 mg total) by mouth as needed. 08/10/20 11/08/20  [provider]    Allergies    Bee venom, Ivp dye [iodinated diagnostic agents], Codeine, Entresto [sacubitril-valsartan], Shrimp [shellfish allergy], and Jardiance [empagliflozin]  Review of Systems   Review of Systems  Constitutional: Positive for fatigue. Negative for chills and fever.   HENT: Positive for sore throat. Negative for ear pain and rhinorrhea.   Eyes: Negative for pain and visual disturbance.  Respiratory: Positive for shortness of breath and wheezing. Negative for cough.   Cardiovascular: Positive for chest pain. Negative for leg swelling.  Gastrointestinal: Positive for blood in stool. Negative for abdominal pain, diarrhea, nausea and vomiting.  Genitourinary: Negative for dysuria and hematuria.  Musculoskeletal: Positive for gait problem (2/2 dyspnea). Negative for joint swelling.  Skin: Positive for rash (GU rash due to medications). Negative for  color change.  Neurological: Negative for light-headedness and headaches.  Psychiatric/Behavioral: Negative for confusion and suicidal ideas.  All other systems reviewed and are negative.   Physical Exam Updated Vital Signs BP (!) 124/91 (BP Location: Left Arm)   Pulse (!) 120   Temp 98.8 F (37.1 C)   Resp 18   SpO2 97%   Physical Exam Vitals and nursing note reviewed.  Constitutional:      General: She is not in acute distress.    Appearance: She is well-developed and well-nourished. She is obese. She is not ill-appearing or toxic-appearing.  HENT:     Head: Normocephalic and atraumatic.  Eyes:     Extraocular Movements: Extraocular movements intact.     Conjunctiva/sclera: Conjunctivae normal.  Cardiovascular:     Rate and Rhythm: Regular rhythm. Tachycardia present.     Heart sounds: No murmur heard.   Pulmonary:     Effort: Pulmonary effort is normal. No respiratory distress.     Breath sounds: Rales present.  Abdominal:     Palpations: Abdomen is soft.     Tenderness: There is no abdominal tenderness. There is no guarding or rebound.  Musculoskeletal:        General: No edema.     Cervical back: Neck supple.     Right lower leg: No edema.     Left lower leg: No edema.  Skin:    General: Skin is warm and dry.  Neurological:     Mental Status: She is alert.     Comments: Alert,  grossly oriented, moves all extremities spontaneously  Psychiatric:        Mood and Affect: Mood and affect normal.     ED Results / Procedures / Treatments   Labs (all labs ordered are listed, but only abnormal results are displayed) Labs Reviewed  CBC WITH DIFFERENTIAL/PLATELET - Abnormal; Notable for the following components:      Result Value   RDW 18.6 (*)    All other components within normal limits  BASIC METABOLIC PANEL - Abnormal; Notable for the following components:   Chloride 113 (*)    CO2 17 (*)    Glucose, Bld 110 (*)    BUN 43 (*)    Creatinine, Ser 1.46 (*)    Calcium 8.7 (*)    GFR, Estimated 37 (*)    All other components within normal limits  HEPATIC FUNCTION PANEL - Abnormal; Notable for the following components:   Total Protein 6.0 (*)    Albumin 3.1 (*)    Alkaline Phosphatase 187 (*)    Bilirubin, Direct 0.3 (*)    All other components within normal limits  BRAIN NATRIURETIC PEPTIDE - Abnormal; Notable for the following components:   B Natriuretic Peptide 864.3 (*)    All other components within normal limits  DIGOXIN LEVEL - Abnormal; Notable for the following components:   Digoxin Level <0.2 (*)    All other components within normal limits  HEMOGLOBIN A1C - Abnormal; Notable for the following components:   Hgb A1c MFr Bld 6.6 (*)    All other components within normal limits  PROTIME-INR - Abnormal; Notable for the following components:   Prothrombin Time 23.8 (*)    INR 2.2 (*)    All other components within normal limits  TROPONIN I (HIGH SENSITIVITY) - Abnormal; Notable for the following components:   Troponin I (High Sensitivity) 24 (*)    All other components within normal limits  RESP PANEL BY RT-PCR (FLU  A&B, COVID) ARPGX2  TSH  MAGNESIUM  TSH  COMPREHENSIVE METABOLIC PANEL    EKG EKG Interpretation  Date/Time:  Thursday August 13 2020 17:10:04 EST Ventricular Rate:  138 PR Interval:    QRS Duration: 92 QT  Interval:  328 QTC Calculation: 496 R Axis:   25 Text Interpretation: Atrial fibrillation with rapid ventricular response ST & T wave abnormality, consider lateral ischemia Abnormal ECG Confirmed by Lennice Sites [phone]) on 08/13/2020 5:25:00 PM   Radiology No results found.  Procedures Procedures (including critical care time)  Medications Ordered in ED Medications - No data to display  ED Course  I have reviewed the triage vital signs and the nursing notes.  Pertinent labs & imaging results that were available during my care of the patient were reviewed by me and considered in my medical decision making (see chart for details).    MDM Rules/Calculators/A&P                         74 y/o female with dyspnea in context of likely several days persistent atrial fibrillation with RVR. I am most concerned for exacerbation of pt's underlying rhythm disorder, but differential includes anemia, electrolyte derangement, pneumonia. Pt does note some dark red bleeding over last 3-4 days - she is unsure if vaginal or rectal bleeding. On review of recent cardiology notes, pt is under consideration for more advanced treatment modalities including ablation and PPM placement. She has been tried on increasing doses of metoprolol. There has been hesitation to cardiovert given recent missed doses of anticoagulation. She endorses missing a dose of Xarelto several days ago, but in ED today she endorses compliance otherwise.  Labs reviewed and notable for CO2 17, BUN 43, elevated creatinine (minimally elevated from prior), elevated BNP, minimally elevated troponin 24. ECG shows atrial fibrillation with RVR, rate 138.  QRS 92, QTc 496.  There is no STEMI.  There is no evidence of acute ischemia/infarct. Imaging reviewed and notable for CXR with cardiomegaly and mild pulmonary vascular congestion/interstitial edema.  Cardiology consulted. They recommend admission for medical management and consideration for  further procedures. PT will be admitted to the cardiology service.  Final Clinical Impression(s) / ED Diagnoses Final diagnoses:  Atrial fibrillation with RVR ([place])      Vanna Scotland, MD [id number]    [place], [place], DO 08/13/20 2328

## 2020-08-13 NOTE — ED Triage Notes (Signed)
Pt BIB [facility] from home, c/o shortness of breath with exertion x 3 weeks. Pt recently diagnosed with afib, HR 120-140s. Denies chest pain, EMS VS: BP150/96, CBG 131

## 2020-08-13 NOTE — H&P (Addendum)
Cardiology Admission History and Physical:   Patient ID: [name] [name]; MRN: [id number]; DOB: October 26, 1945   Admission date: 08/13/2020  Primary Care Provider: [name], MD Primary Cardiologist: [name], MD 07/23/2020 Primary Electrophysiologist: [name], MD  08/11/2020  Chief Complaint:  SOB, Afib, RVR  Patient Profile:   [name] [name] is a 74 y.o. female with a history of persistent Afib, HFimpEF w/ EF normal by echo 11/2019, CABG 2016, cardiac arrest, pre-DM, HTN, HLD, CKD, COPD, GERD, gout.  History of Present Illness:   [name] [name] was seen by Dr Lovena Le 12/28 and was in Afib, RVR with the strategy being rate control. She was having some SOB, not severe. She was not taking the digoxin. Toprol XL was 250 mg qd.   She was restarted on the digoxin, but has not gotten the med yet.   She missed one dose of her Xarelto recently, she fell asleep before she took it, woke at 2 am, but was told by Dr Lovena Le not to take it after midnight. She skipped that one night, but otherwise, has been compliant w/ rx.   She has been extremely SOB for a week. When she walks even a short distance, she will get very SOB and have to sit down. She describes orthopnea and PND but denies LE edema. Not aware of any weight gain.  Today, she was just tired to dealing with it and tired of being so SOB so came to the ER. O2 sats are good, but pt resp rate and effort are elevated.   She has been having problems w/ rectal bleeding, which she has periodically. She has been evaluated, it is hemorrhoidal bleeding.   Past Medical History:  Diagnosis Date  . (HFimpEF) heart failure with improved ejection fraction ([place])    a. 07/2018 Echo: EF 30-35%; b. 11/2019 Echo: EF 55-60%, no rwma, Gr2 DD, Nl RV size/fxn. Mild BAE. Mild MR/AI.  Marland Kitchen Arthritis   . Asthma   . Atopic dermatitis   . CAD (coronary artery disease)    a. 2016 s/p CABG x 2 (LIMA->LAD, VG->OM); b. 08/2018 MV: EF 44%, no ischemia/infact.  .  Cardiac arrest ([place]) 10/2014  . Cardiomyopathy, ischemic    a. 07/2018 Echo: EF 30-35%; 11/2019 Echo: EF 55-60%.  . Carotid arterial disease ([place])    a. 11/2019 Carotid U/S  . CKD (chronic kidney disease), stage III ([place])   . COPD (chronic obstructive pulmonary disease) ([place])   . Esophageal dilatation 2013  . GERD (gastroesophageal reflux disease)   . Gout   . Headache   . Hypertension   . Hypokalemia   . Idiopathic angioedema   . LGI bleed 08/06/2017   a. felt to be hemorrhoidal during that admission (no drop in Hgb).  . Lower back pain   . Paroxysmal atrial fibrillation ([place])    a. Dx 2016-->h/o difficult to control rates (complicated by noncompliance), not felt to be a candidate for ablation or antiarrhythmic due to noncompliance; b. Recurrent AF 2021 - converted w/ IV dilt; c. CHA2DS2VASc = 7-->Xarelto.  . Personal history of noncompliance with medical treatment, presenting hazards to health   . Prediabetes   . S/P CABG x 2 with clipping of LA appendage 11/14/2014   LIMA to LAD, SVG to OM, EVH via right thigh  . Urine incontinence   . Uterine fibroid     Past Surgical History:  Procedure Laterality Date  . BALLOON DILATION N/A 10/10/2019   Procedure: BALLOON DILATION;  Surgeon: Alessandra Bevels,  Orson Gear, MD;  Location: [place];  Service: Gastroenterology;  Laterality: N/A;  . BIOPSY  10/10/2019   Procedure: BIOPSY;  Surgeon: Otis Brace, MD;  Location: [place];  Service: Gastroenterology;;  . BIOPSY  10/11/2019   Procedure: BIOPSY;  Surgeon: Otis Brace, MD;  Location: [place];  Service: Gastroenterology;;  . CARDIOVERSION N/A 11/18/2014   Procedure: CARDIOVERSION;  Surgeon: [name], MD;  Location: [place];  Service: Cardiovascular;  Laterality: N/A;  . CARDIOVERSION N/A 12/25/2017   Procedure: CARDIOVERSION;  Surgeon: [name], MD;  Location: [place];  Service: Cardiovascular;  Laterality: N/A;  . CLIPPING OF ATRIAL APPENDAGE N/A 11/14/2014    Procedure: CLIPPING OF ATRIAL APPENDAGE;  Surgeon: [name], MD;  Location: [place];  Service: Open Heart Surgery;  Laterality: N/A;  . COLONOSCOPY  2013  . COLONOSCOPY WITH PROPOFOL N/A 10/11/2019   Procedure: COLONOSCOPY WITH PROPOFOL;  Surgeon: Otis Brace, MD;  Location: [place];  Service: Gastroenterology;  Laterality: N/A;  . CORONARY ARTERY BYPASS GRAFT N/A 11/14/2014   Procedure: CORONARY ARTERY BYPASS GRAFTING (CABG)TIMES 2 USING LEFT INTERNAL MAMMARY ARTERY AND RIGHT SAPHENOUS VEIN HARVESTED ENDOSCOPICALLY;  Surgeon: [name], MD;  Location: [place];  Service: Open Heart Surgery;  Laterality: N/A;  . ESOPHAGOGASTRODUODENOSCOPY (EGD) WITH PROPOFOL N/A 10/10/2019   Procedure: ESOPHAGOGASTRODUODENOSCOPY (EGD) WITH PROPOFOL;  Surgeon: Otis Brace, MD;  Location: [facility];  Service: Gastroenterology;  Laterality: N/A;  . ESOPHAGOGASTRODUODENOSCOPY (EGD) WITH PROPOFOL N/A 10/27/2019   Procedure: ESOPHAGOGASTRODUODENOSCOPY (EGD) WITH PROPOFOL;  Surgeon: Ronnette Juniper, MD;  Location: [place];  Service: Gastroenterology;  Laterality: N/A;  . GIVENS CAPSULE STUDY N/A 10/27/2019   Procedure: GIVENS CAPSULE STUDY;  Surgeon: Ronnette Juniper, MD;  Location: [place];  Service: Gastroenterology;  Laterality: N/A;  . LEFT HEART CATHETERIZATION WITH CORONARY ANGIOGRAM N/A 11/04/2014   Procedure: LEFT HEART CATHETERIZATION WITH CORONARY ANGIOGRAM;  Surgeon: Troy Sine, MD;  Location: [facility] CATH LAB;  Service: Cardiovascular;  Laterality: N/A;  . POLYPECTOMY  10/11/2019   Procedure: POLYPECTOMY;  Surgeon: Otis Brace, MD;  Location: [place] ENDOSCOPY;  Service: Gastroenterology;;  . TEE WITHOUT CARDIOVERSION N/A 11/14/2014   Procedure: TRANSESOPHAGEAL ECHOCARDIOGRAM (TEE);  Surgeon: [name], MD;  Location: [place];  Service: Open Heart Surgery;  Laterality: N/A;  . TEE WITHOUT CARDIOVERSION N/A 12/25/2017   Procedure: TRANSESOPHAGEAL ECHOCARDIOGRAM (TEE);  Surgeon:  [name], MD;  Location: [facility] ENDOSCOPY;  Service: Cardiovascular;  Laterality: N/A;  . TEMPORARY PACEMAKER INSERTION  11/04/2014   Procedure: TEMPORARY PACEMAKER INSERTION;  Surgeon: Troy Sine, MD;  Location: [facility] CATH LAB;  Service: Cardiovascular;;  . TOOTH EXTRACTION       Medications Prior to Admission: Prior to Admission medications   Medication Sig Start Date End Date Taking? Authorizing Provider  acetaminophen (TYLENOL) 500 MG tablet Take 500 mg by mouth every 6 (six) hours as needed for moderate pain.     [provider]  allopurinol (ZYLOPRIM) 300 MG tablet Take 1 tablet (300 mg total) by mouth daily. 01/04/18   Georgiana Shore, NP  atorvastatin (LIPITOR) 10 MG tablet Take 5-10 mg by mouth at bedtime.     [provider]  Coenzyme Q10 (CO Q-10 PO) Take 1 capsule by mouth daily.     [provider]  digoxin (LANOXIN) 0.125 MG tablet Take 0.5 tablets (0.0625 mg total) by mouth daily. 08/11/20   Evans Lance, MD  famotidine (PEPCID) 20 MG tablet Take 20 mg by mouth  daily.    [provider]  furosemide (LASIX) 20 MG tablet Take 2 tablets (40 mg total) by mouth daily. 06/29/20   Kroeger, Lorelee Cover., PA-C  losartan (COZAAR) 25 MG tablet Take 1 tablet (25 mg total) by mouth 2 (two) times daily. 06/29/20   Kroeger, Lorelee Cover., PA-C  metoprolol succinate (TOPROL-XL) 100 MG 24 hr tablet Take 1 tablet in the AM and 1.5 tablet in the PM 08/10/20   Sherran Needs, NP  pantoprazole (PROTONIX) 40 MG tablet Take 40 mg by mouth as needed.    [provider]  rivaroxaban (XARELTO) 20 MG TABS tablet Take 1 tablet (20 mg total) by mouth daily with supper. Resume from 11/01/2019 11/01/19   Aline August, MD  spironolactone (ALDACTONE) 25 MG tablet Take 0.5 tablets (12.5 mg total) by mouth as needed. 08/10/20 11/08/20  [provider]     Allergies:    Allergies  Allergen Reactions  . Bee Venom Anaphylaxis  . Ivp Dye [Iodinated  Diagnostic Agents] Anaphylaxis  . Codeine Nausea And Vomiting  . Entresto [Sacubitril-Valsartan]     Chest pain   . Shrimp [Shellfish Allergy] Swelling  . Jardiance [Empagliflozin] Other (See Comments)    Caused Boils     Social History:   Social History   Socioeconomic History  . Marital status: Widowed    Spouse name: Not on file  . Number of children: 1  . Years of education: 31  . Highest education level: Not on file  Occupational History  . Occupation: RETIRED [employer]  Tobacco Use  . Smoking status: Current Every Day Smoker    Packs/day: 0.50    Years: 56.00    Pack years: 28.00    Types: Cigarettes  . Smokeless tobacco: Never Used  . Tobacco comment: She smokes 1/2 pack daily  Vaping Use  . Vaping Use: Never used  Substance and Sexual Activity  . Alcohol use: No    Alcohol/week: 0.0 standard drinks  . Drug use: No  . Sexual activity: Not on file  Other Topics Concern  . Not on file  Social History Narrative   Patient reports it being difficult to pay for everything due to outstanding medical bills from hospital stay last year but states she is able to keep up with basic expenses.  Patient does have some concerns with her house's condition due to a water leak- had her roof repaired last month but has some leaking in the front which has affected her porch.  Patient owns her own car and is able to drive herself but has some concerns about the reliability of her car- has gotten taxi to the clinic for appointment in the past when her car broke down.   Social Determinants of Health   Financial Resource Strain: Not on file  Food Insecurity: Not on file  Transportation Needs: Not on file  Physical Activity: Not on file  Stress: Not on file  Social Connections: Not on file  Intimate Partner Violence: Not on file    Family History:  [name] family history includes CVA in her sister; Cancer in her mother; Diabetes in her brother; Heart disease in her  father; Other in her father; Prostate cancer (age of onset: 40) in her brother.   The patient She indicated that her mother is deceased. She indicated that her father is deceased. She indicated that both of her sisters are alive. She indicated that both of her brothers are alive. She indicated that  her maternal grandmother is deceased. She indicated that her maternal grandfather is deceased. She indicated that her paternal grandmother is deceased. She indicated that her paternal grandfather is deceased.  ROS:  Please see the history of present illness.  All other ROS reviewed and negative.     Physical Exam/Data:   Vitals:   08/13/20 1705 08/13/20 1843  BP: (!) 124/91 (!) 123/98  Pulse: (!) 120 (!) 42  Resp: 18 (!) 25  Temp: 98.8 F (37.1 C)   SpO2: 97% 100%   No intake or output data in the 24 hours ending 08/13/20 1848 There were no vitals filed for this visit. There is no height or weight on file to calculate BMI.  General:  Well nourished, well developed, female in moderate distress HEENT: normal Lymph: no adenopathy Neck:  JVD 10 cm not elevated Endocrine:  No thryomegaly Vascular: No carotid bruits; 4/4 extremity pulses 2+ bilaterally  Cardiac:  normal S1, S2; rapid and irreg R&R, no murmur, no rub or gallop  Lungs: decreased BS to auscultation bilaterally, no wheezing, rhonchi, + rales  Abd: soft, nontender, no hepatomegaly  Ext: no edema Musculoskeletal:  No deformities, BUE and BLE strength normal and equal Skin: warm and dry  Neuro:  CNs 2-12 intact, no focal abnormalities noted Psych:  Normal affect    EKG:  The ECG was personally reviewed: atrial fib, RVR, HR 138 Telemetry: atrial fib, RVR  Relevant CV Studies:  ECHO: 06/27/2020 1. Global hypokinesis with akinesis of the inferior and inferolateral  wall.  2. Left ventricular ejection fraction, by estimation, is 30 to 35%. The  left ventricle has moderate to severely decreased function. The left  ventricle  demonstrates regional wall motion abnormalities (see scoring diagram/findings for description). There is moderate left ventricular hypertrophy. Left ventricular diastolic function could not be evaluated.  3. Right ventricular systolic function is normal. The right ventricular  size is normal. There is normal pulmonary artery systolic pressure.  4. Left atrial size was severely dilated.  5. Right atrial size was moderately dilated.  6. The mitral valve is normal in structure. Mild mitral valve  regurgitation. No evidence of mitral stenosis.  7. The aortic valve is tricuspid. Aortic valve regurgitation is trivial.  Mild to moderate aortic valve sclerosis/calcification is present, without any evidence of aortic stenosis.  8. The inferior vena cava is dilated in size with >50% respiratory  variability, suggesting right atrial pressure of 8 mmHg.   MYOVIEW: 09/07/2018  Nuclear stress EF: 44%.  There was no ST segment deviation noted during stress.  The study is normal.  This is a low risk study.  The left ventricular ejection fraction is moderately decreased (30-44%).   CATH: NONE  Laboratory Data:  Chemistry Recent Labs  Lab 08/13/20 1757  NA 140  K 3.8  CL 113*  CO2 17*  GLUCOSE 110*  BUN 43*  CREATININE 1.46*  CALCIUM 8.7*  GFRNONAA 37*  ANIONGAP 10    Recent Labs  Lab 08/13/20 1757  PROT 6.0*  ALBUMIN 3.1*  AST 34  ALT 41  ALKPHOS 187*  BILITOT 1.2   Hematology Recent Labs  Lab 08/13/20 1757  WBC 10.1  RBC 5.05  HGB 13.5  HCT 42.8  MCV 84.8  MCH 26.7  MCHC 31.5  RDW 18.6*  PLT 153   Cardiac Enzymes  High Sensitivity Troponin:   Recent Labs  Lab 08/13/20 1757  TROPONINIHS 24*     BNP Recent Labs  Lab 08/13/20 1757  BNP 864.3*    DDimer No results for input(s): DDIMER in the last 168 hours. Lipids:  Lab Results  Component Value Date   CHOL 116 11/12/2014   HDL 34 (L) 11/12/2014   LDLCALC 60 11/12/2014   TRIG 111 11/12/2014    CHOLHDL 3.4 11/12/2014   INR:  Lab Results  Component Value Date   INR 3.3 (H) 06/27/2020   INR 2.1 (H) 06/26/2020   INR 2.5 (H) 02/10/2020   A1c:  Lab Results  Component Value Date   HGBA1C 6.2 (H) 06/27/2020   Thyroid:  Lab Results  Component Value Date   TSH 0.735 02/01/2018    Radiology/Studies:  DG Chest Portable 1 View  Result Date: 08/13/2020 CLINICAL DATA:  Shortness of breath EXAM: PORTABLE CHEST 1 VIEW COMPARISON:  08/03/2020 FINDINGS: Postoperative changes in the mediastinum. Shallow inspiration. Diffuse cardiac enlargement. Mildly prominent pulmonary vascularity with slight interstitial infiltrates in the bases probably representing edema. No pleural effusions. No pneumothorax. Mediastinal contours appear intact. Calcification of the aorta. Degenerative changes in the spine and shoulders. IMPRESSION: Cardiac enlargement with mild pulmonary vascular congestion and interstitial edema. Similar appearance to previous study. Electronically Signed   By: Lucienne Capers M.D.   On: 08/13/2020 18:35    Assessment and Plan:   1. Atrial fib, rvr - plan was to restart dig, will do.  - will give 1 dose of 0.5 mg IV, MD advise if additional loading dose needed - start dig 0.0625 mg po in am (per Dr Tanna Furry orders) - consider amio - continue Xarelto  2. SOB - some volume overload by exam - BNP elevated - CXR w/ edema - give Lasix 40 mg IV x 1 and follow sx, BMET  3. CKD III - Cr baseline unclear, Cr generally < 1.3 in June, but in November, was about where it is now.  - follow w/ diuresis - w/ elevated Cr, will hold home losartan 25 mg bid, spiro 12.5 mg prn and allopurinol 300 mg qd  4. Alk Phos - level is elevated, was mildly elevated in 2019 at 155, but then normalized - level today is 187 - recheck in am, if still elevated, consider abd [place]  5. Hx gout - w/ decreased renal function hold allopurinol for now - MD advise on using a lower dose  Active Problems:    * No active hospital problems. *   For questions or updates, please contact [place] Please consult [email or web address] for contact info under Cardiology/STEMI.    Jonetta Speak, PA-C  08/13/2020 6:48 PM

## 2020-08-13 NOTE — ED Provider Notes (Signed)
I have personally seen and examined the patient. I have reviewed the documentation on PMH/FH/Soc Hx. I have discussed the plan of care with the resident and patient.  I have reviewed and agree with the resident's documentation. Please see associated encounter note.  Briefly, the patient is a 74 y.o. female here with shortness of breath and atrial fibrillation with RVR. Atrial fibrillation with RVR in the 140s on EKG. History of the same. Possibly some missed doses of Xarelto. Not a candidate for cardioversion. Has been overall difficult to control on high-dose metoprolol and now recently digoxin although she is not been able to take that medicine due to some issues with getting it. She has had daily visits to cardiology over the last several days with no improvement. Concern for some volume overload and heart failure on exam and would like to avoid diltiazem. Cardiology was consulted and will start digoxin and admit for further care. Lab work otherwise is unremarkable. No significant anemia, lateral abnormality, kidney injury. Signs of volume overload on chest x-ray with slightly elevated BNP.  This chart was dictated using voice recognition software.  Despite best efforts to proofread,  errors can occur which can change the documentation meaning.     EKG Interpretation  Date/Time:  Thursday August 13 2020 17:10:04 EST Ventricular Rate:  138 PR Interval:    QRS Duration: 92 QT Interval:  328 QTC Calculation: 496 R Axis:   25 Text Interpretation: Atrial fibrillation with rapid ventricular response ST & T wave abnormality, consider lateral ischemia Abnormal ECG Confirmed by Virgina Norfolk [phone]) on 08/13/2020 5:25:00 PM      .Critical Care Performed by: Virgina Norfolk, DO Authorized by: Virgina Norfolk, DO   Critical care provider statement:    Critical care time (minutes):  45   Critical care was necessary to treat or prevent imminent or life-threatening deterioration of the following  conditions: afib with rvr.   Critical care was time spent personally by me on the following activities:  Blood draw for specimens, development of treatment plan with patient or surrogate, discussions with primary provider, evaluation of patient's response to treatment, examination of patient, obtaining history from patient or surrogate, ordering and performing treatments and interventions, ordering and review of laboratory studies, ordering and review of radiographic studies, pulse oximetry, re-evaluation of patient's condition and review of old charts   I assumed direction of critical care for this patient from another provider in my specialty: no        Virgina Norfolk, DO 08/13/20 1946

## 2020-08-14 ENCOUNTER — Encounter (HOSPITAL_COMMUNITY): Payer: Self-pay | Admitting: Cardiology

## 2020-08-14 DIAGNOSIS — I4891 Unspecified atrial fibrillation: Secondary | ICD-10-CM

## 2020-08-14 LAB — COMPREHENSIVE METABOLIC PANEL
ALT: 40 U/L (ref 0–44)
AST: 32 U/L (ref 15–41)
Albumin: 3.1 g/dL — ABNORMAL LOW (ref 3.5–5.0)
Alkaline Phosphatase: 189 U/L — ABNORMAL HIGH (ref 38–126)
Anion gap: 11 (ref 5–15)
BUN: 41 mg/dL — ABNORMAL HIGH (ref 8–23)
CO2: 19 mmol/L — ABNORMAL LOW (ref 22–32)
Calcium: 9.1 mg/dL (ref 8.9–10.3)
Chloride: 112 mmol/L — ABNORMAL HIGH (ref 98–111)
Creatinine, Ser: 1.4 mg/dL — ABNORMAL HIGH (ref 0.44–1.00)
GFR, Estimated: 39 mL/min — ABNORMAL LOW (ref >60)
Glucose, Bld: 115 mg/dL — ABNORMAL HIGH (ref 70–99)
Potassium: 3.9 mmol/L (ref 3.5–5.1)
Sodium: 142 mmol/L (ref 135–145)
Total Bilirubin: 1.2 mg/dL (ref 0.3–1.2)
Total Protein: 6.4 g/dL — ABNORMAL LOW (ref 6.5–8.1)

## 2020-08-14 LAB — POC OCCULT BLOOD, ED: Fecal Occult Bld: POSITIVE — AB

## 2020-08-14 MED ORDER — METOPROLOL SUCCINATE ER 100 MG PO TB24
100.0000 mg | ORAL_TABLET | Freq: Every day | ORAL | Status: DC
  Administered 2020-08-15: 100 mg via ORAL
  Filled 2020-08-14: qty 1

## 2020-08-14 MED ORDER — FUROSEMIDE 10 MG/ML IJ SOLN
40.0000 mg | Freq: Every day | INTRAMUSCULAR | Status: DC
  Administered 2020-08-14: 40 mg via INTRAVENOUS
  Filled 2020-08-14 (×2): qty 4

## 2020-08-14 MED ORDER — METOPROLOL SUCCINATE ER 100 MG PO TB24
100.0000 mg | ORAL_TABLET | Freq: Every day | ORAL | Status: DC
  Administered 2020-08-14: 100 mg via ORAL
  Filled 2020-08-14: qty 1

## 2020-08-14 NOTE — Progress Notes (Signed)
Progress Note  Patient Name: [name] [name] Date of Encounter: 08/14/2020  Primary Cardiologist: [name], MD   Subjective   Dyspnea still present but improved. No chest pain.   Inpatient Medications    Scheduled Meds: . atorvastatin  10 mg Oral QHS  . digoxin  0.0625 mg Oral Daily  . famotidine  20 mg Oral Daily  . metoprolol succinate  150 mg Oral QHS   And  . metoprolol succinate  100 mg Oral Daily  . rivaroxaban  20 mg Oral Q supper  . sodium chloride flush  3 mL Intravenous Q12H   Continuous Infusions: . sodium chloride     PRN Meds: sodium chloride, acetaminophen, ALPRAZolam, nitroGLYCERIN, ondansetron (ZOFRAN) IV, sodium chloride flush, zolpidem   Vital Signs    Vitals:   08/14/20 0835 08/14/20 0915 08/14/20 0930 08/14/20 0936  BP: 123/81 136/88 (!) 123/101 (!) 133/119  Pulse: 92 85 (!) 58 (!) 58  Resp: 20 (!) 22 (!) 27 12  Temp:      TempSrc:      SpO2: 96% 100% 98% 100%   No intake or output data in the 24 hours ending 08/14/20 1042 There were no vitals filed for this visit.  Telemetry    Atrial fib with a RVR/CVR - Personally Reviewed  ECG    Atrial fib with a CVR/RVR - Personally Reviewed  Physical Exam   GEN: No acute distress.   Neck: 6 cm JVD Cardiac: IRRR, no murmurs, rubs, or gallops.  Respiratory: scattered rales, slight increased work of breathing.  GI: Soft, nontender, non-distended  MS: No edema; No deformity. Neuro:  Nonfocal  Psych: Normal affect   Labs    Chemistry Recent Labs  Lab 08/13/20 1757 08/14/20 0326  NA 140 142  K 3.8 3.9  CL 113* 112*  CO2 17* 19*  GLUCOSE 110* 115*  BUN 43* 41*  CREATININE 1.46* 1.40*  CALCIUM 8.7* 9.1  PROT 6.0* 6.4*  ALBUMIN 3.1* 3.1*  AST 34 32  ALT 41 40  ALKPHOS 187* 189*  BILITOT 1.2 1.2  GFRNONAA 37* 39*  ANIONGAP 10 11     Hematology Recent Labs  Lab 08/13/20 1757  WBC 10.1  RBC 5.05  HGB 13.5  HCT 42.8  MCV 84.8  MCH 26.7  MCHC 31.5  RDW 18.6*  PLT 153     Cardiac EnzymesNo results for input(s): TROPONINI in the last 168 hours. No results for input(s): TROPIPOC in the last 168 hours.   BNP Recent Labs  Lab 08/13/20 1757  BNP 864.3*     DDimer No results for input(s): DDIMER in the last 168 hours.   Radiology    DG Chest Portable 1 View  Result Date: 08/13/2020 CLINICAL DATA:  Shortness of breath EXAM: PORTABLE CHEST 1 VIEW COMPARISON:  08/03/2020 FINDINGS: Postoperative changes in the mediastinum. Shallow inspiration. Diffuse cardiac enlargement. Mildly prominent pulmonary vascularity with slight interstitial infiltrates in the bases probably representing edema. No pleural effusions. No pneumothorax. Mediastinal contours appear intact. Calcification of the aorta. Degenerative changes in the spine and shoulders. IMPRESSION: Cardiac enlargement with mild pulmonary vascular congestion and interstitial edema. Similar appearance to previous study. Electronically Signed   By: Lucienne Capers M.D.   On: 08/13/2020 18:35    Cardiac Studies   none  Patient Profile     74 y.o. female admitted with worsening Sob and uncontrolled atrial fib.    Assessment & Plan    1. Uncontrolled atrial fib - I  discussed the importance of taking her medications when I saw her in the office including taking digoxin. She agreed at the time but apparently never started this medication. She will have her dose of digoxin increased. Ultimately AV node ablation and PPM will be her best option. 2. Dyspnea - her oxygen level is in the mid 80's on my exam raising the question of pulmonary edema vs other processes. She will need diuresis.  For questions or updates, please contact [facility] Please consult [email or web address] for contact info under Cardiology/STEMI.   Signed, Lewayne Bunting, MD  08/14/2020, 10:42 AM  Patient ID: [name] [name], female   DOB: 1945/12/26, 74 y.o.   MRN: [id number]

## 2020-08-14 NOTE — Progress Notes (Signed)
Pt arrived to unit, she is alert and oriented. She is having shortness of breath. Pt is oriented to her room. Skin assessment is done with another RN. TELE box confirmed with another RN as well. Pt's needs are met and safety measures are maintained

## 2020-08-14 NOTE — ED Notes (Signed)
Ordered breakfast 

## 2020-08-15 DIAGNOSIS — I5043 Acute on chronic combined systolic (congestive) and diastolic (congestive) heart failure: Secondary | ICD-10-CM | POA: Diagnosis not present

## 2020-08-15 DIAGNOSIS — I4891 Unspecified atrial fibrillation: Secondary | ICD-10-CM | POA: Diagnosis not present

## 2020-08-15 LAB — CBC
HCT: 44.9 % (ref 36.0–46.0)
Hemoglobin: 14 g/dL (ref 12.0–15.0)
MCH: 26.4 pg (ref 26.0–34.0)
MCHC: 31.2 g/dL (ref 30.0–36.0)
MCV: 84.6 fL (ref 80.0–100.0)
Platelets: 156 10*3/uL (ref 150–400)
RBC: 5.31 MIL/uL — ABNORMAL HIGH (ref 3.87–5.11)
RDW: 18.9 % — ABNORMAL HIGH (ref 11.5–15.5)
WBC: 9.4 10*3/uL (ref 4.0–10.5)
nRBC: 0 % (ref 0.0–0.2)

## 2020-08-15 LAB — BASIC METABOLIC PANEL
Anion gap: 10 (ref 5–15)
BUN: 36 mg/dL — ABNORMAL HIGH (ref 8–23)
CO2: 21 mmol/L — ABNORMAL LOW (ref 22–32)
Calcium: 8.9 mg/dL (ref 8.9–10.3)
Chloride: 111 mmol/L (ref 98–111)
Creatinine, Ser: 1.32 mg/dL — ABNORMAL HIGH (ref 0.44–1.00)
GFR, Estimated: 42 mL/min — ABNORMAL LOW (ref >60)
Glucose, Bld: 119 mg/dL — ABNORMAL HIGH (ref 70–99)
Potassium: 3.9 mmol/L (ref 3.5–5.1)
Sodium: 142 mmol/L (ref 135–145)

## 2020-08-15 MED ORDER — METOPROLOL SUCCINATE ER 100 MG PO TB24: 100.0000 mg | ORAL_TABLET | Freq: Two times a day (BID) | ORAL | 3 refills | Status: DC

## 2020-08-15 MED ORDER — TORSEMIDE 20 MG PO TABS: 40.0000 mg | ORAL_TABLET | Freq: Every day | ORAL | 6 refills | Status: DC

## 2020-08-15 MED ORDER — TORSEMIDE 20 MG PO TABS
40.0000 mg | ORAL_TABLET | Freq: Every day | ORAL | Status: DC
  Administered 2020-08-15: 40 mg via ORAL

## 2020-08-15 NOTE — Progress Notes (Signed)
Progress Note  Patient Name: [name] [name] Date of Encounter: 08/15/2020  [facility] Cardiologist: [name], MD  [facility] EP: Dr. Lovena Le  Subjective   No acute events overnight. Unable to get IV this AM. Asking when she can go home. Mild shortness of breath remains, no chest pain.  Inpatient Medications    Scheduled Meds: . atorvastatin  10 mg Oral QHS  . digoxin  0.0625 mg Oral Daily  . famotidine  20 mg Oral Daily  . metoprolol succinate  100 mg Oral QHS   And  . metoprolol succinate  100 mg Oral Daily  . rivaroxaban  20 mg Oral Q supper  . sodium chloride flush  3 mL Intravenous Q12H  . torsemide  40 mg Oral Daily   Continuous Infusions: . sodium chloride     PRN Meds: sodium chloride, acetaminophen, ALPRAZolam, nitroGLYCERIN, ondansetron (ZOFRAN) IV, sodium chloride flush, zolpidem   Vital Signs    Vitals:   08/15/20 0034 08/15/20 0306 08/15/20 0731 08/15/20 0822  BP: (!) 146/108 117/86 (!) 134/108 126/86  Pulse: 89 99 99 87  Resp: 20 20 18    Temp: 97.7 F (36.5 C) (!) 97.3 F (36.3 C) 97.7 F (36.5 C)   TempSrc: Oral Oral Oral   SpO2: 100% 99% 100%   Weight: 90.9 kg     Height:        Intake/Output Summary (Last 24 hours) at 08/15/2020 1132 Last data filed at 08/15/2020 0900 Gross per 24 hour  Intake 1078 ml  Output 200 ml  Net 878 ml   Last 3 Weights 08/15/2020 08/14/2020 08/11/2020  Weight (lbs) 200 lb 8 oz 201 lb 1 oz 206 lb  Weight (kg) 90.946 kg 91.2 kg 93.441 kg      Telemetry    Atrial fibrillation, rates 90s-110s- Personally Reviewed  ECG    No new since 12/30 - Personally Reviewed  Physical Exam   GEN: No acute distress.   Neck: No JVD sitting upright Cardiac: irregularly irregular S1 and S2, no murmurs, rubs, or gallops.  Respiratory: less converstationally dyspneic, on room air, but has rales at bilateral bases GI: Soft, nontender, non-distended  MS: No edema; No deformity. Neuro:  Nonfocal  Psych: Normal affect   Labs     High Sensitivity Troponin:   Recent Labs  Lab 08/13/20 1757  TROPONINIHS 24*      Chemistry Recent Labs  Lab 08/13/20 1757 08/14/20 0326  NA 140 142  K 3.8 3.9  CL 113* 112*  CO2 17* 19*  GLUCOSE 110* 115*  BUN 43* 41*  CREATININE 1.46* 1.40*  CALCIUM 8.7* 9.1  PROT 6.0* 6.4*  ALBUMIN 3.1* 3.1*  AST 34 32  ALT 41 40  ALKPHOS 187* 189*  BILITOT 1.2 1.2  GFRNONAA 37* 39*  ANIONGAP 10 11     Hematology Recent Labs  Lab 08/13/20 1757  WBC 10.1  RBC 5.05  HGB 13.5  HCT 42.8  MCV 84.8  MCH 26.7  MCHC 31.5  RDW 18.6*  PLT 153    BNP Recent Labs  Lab 08/13/20 1757  BNP 864.3*     DDimer No results for input(s): DDIMER in the last 168 hours.   Radiology    DG Chest Portable 1 View  Result Date: 08/13/2020 CLINICAL DATA:  Shortness of breath EXAM: PORTABLE CHEST 1 VIEW COMPARISON:  08/03/2020 FINDINGS: Postoperative changes in the mediastinum. Shallow inspiration. Diffuse cardiac enlargement. Mildly prominent pulmonary vascularity with slight interstitial infiltrates in the bases  probably representing edema. No pleural effusions. No pneumothorax. Mediastinal contours appear intact. Calcification of the aorta. Degenerative changes in the spine and shoulders. IMPRESSION: Cardiac enlargement with mild pulmonary vascular congestion and interstitial edema. Similar appearance to previous study. Electronically Signed   By: Burman Nieves M.D.   On: 08/13/2020 18:35    Cardiac Studies   Echo 06/27/20 1. Global hypokinesis with akinesis of the inferior and inferolateral  wall.  2. Left ventricular ejection fraction, by estimation, is 30 to 35%. The  left ventricle has moderate to severely decreased function. The left  ventricle demonstrates regional wall motion abnormalities (see scoring  diagram/findings for description). There  is moderate left ventricular hypertrophy. Left ventricular diastolic  function could not be evaluated.  3. Right ventricular  systolic function is normal. The right ventricular  size is normal. There is normal pulmonary artery systolic pressure.  4. Left atrial size was severely dilated.  5. Right atrial size was moderately dilated.  6. The mitral valve is normal in structure. Mild mitral valve  regurgitation. No evidence of mitral stenosis.  7. The aortic valve is tricuspid. Aortic valve regurgitation is trivial.  Mild to moderate aortic valve sclerosis/calcification is present, without  any evidence of aortic stenosis.  8. The inferior vena cava is dilated in size with >50% respiratory  variability, suggesting right atrial pressure of 8 mmHg.   Patient Profile     75 y.o. female with PMH of persistent, now permanent, afib, prior heart failure with improved EF, now most recently reduced again, CABG 2016, hypertension, hyperlipidemia who was admitted 08/13/20 for shortness of breath and atrial fibrillation with RVR  Assessment & Plan    Afib RVR:  - rates much improved on digoxin.  -continue metoprolol succinate, 100 mg BID -discussed with Dr. Ladona Ridgel this AM. Low utility in attempted TEE-CV. Will aim for rate control.  -Dr. Lubertha Basque note recommended AV node ablation and pacemaker if unable to control.  -continue rivaroxaban, though FOBT positive. CBC pending today   SOB, PND, orthopnea, rales on exam, BNP elevated  -output not well documented -admission weight not charted, weight 12/31 91.2 kg, today's weight 90.9 kg -Cr improved to 1.40 yesterday, none yet from today -losartan and spironolactone remain on hold. BMET pending today -has not been able to get IV lasix as no IV currently, has had several attempts and unable to place thus far. Will give oral torsemide today and monitor urine output -if she has good urine output on torsemide, would recommend monitoring heart rate and O2 sat with ambulation. If O2 sats remain stable with ambulation, could consider discharge later today. Ideally would prefer  one more day of diuresis, but will monitor closely.  For questions or updates, please contact [facility] Please consult [email or web address] for contact info under     Signed, Jodelle Red, MD  08/15/2020, 11:32 AM

## 2020-08-15 NOTE — Discharge Instructions (Signed)
You have been prescribed torsemide 40mg  daily (in place of your previously prescribed lasix or furosemide) to help keep extra fluid off of you.  1. Weigh yourself EVERY morning after you go to the bathroom but before you eat or drink anything. Write this number down in a weight log/diary. If you gain 3 pounds overnight or 5 pounds in a week, call the office. 2. Take your medicines as prescribed. If you have concerns about your medications, please call before you stop taking them.  3. Eat low salt foods--Limit salt (sodium) to 2000 mg per day. This will help prevent your body from holding onto fluid. Read food labels as many processed foods have a lot of sodium, especially canned goods and prepackaged meats. If you would like some assistance choosing low sodium foods, we would be happy to set you up with a nutritionist. 4. Stay as active as you can everyday. Staying active will give you more energy and make your muscles stronger. Start with 5 minutes at a time and work your way up to 30 minutes a day. Break up your activities--do some in the morning and some in the afternoon. Start with 3 days per week and work your way up to 5 days as you can.  If you have chest pain, feel short of breath, dizzy, or lightheaded, STOP. If you don't feel better after a short rest, call 911. If you do feel better, call the office to let us know you have symptoms with exercise. 5. Limit all fluids for the day to less than 2 liters. Fluid includes all drinks, coffee, juice, ice chips, soup, jello, and all other liquids.  Please start your digoxin which was previously prescribed by Dr. [place] prior to your admission to the hospital.   Please keep a log of your blood pressures to bring to your follow-up visit. This will help determine if it is safe to restart your losartan and spironolactone  Information on my medicine - XARELTO (Rivaroxaban)  This medication education was reviewed with me or my healthcare representative  as part of my discharge preparation.   Why was Xarelto prescribed for you? Xarelto was prescribed for you to reduce the risk of a blood clot forming that can cause a stroke if you have a medical condition called atrial fibrillation (a type of irregular heartbeat).  What do you need to know about xarelto ? Take your Xarelto ONCE DAILY at the same time every day with your evening meal. If you have difficulty swallowing the tablet whole, you may crush it and mix in applesauce just prior to taking your dose.  Take Xarelto exactly as prescribed by your doctor and DO NOT stop taking Xarelto without talking to the doctor who prescribed the medication.  Stopping without other stroke prevention medication to take the place of Xarelto may increase your risk of developing a clot that causes a stroke.  Refill your prescription before you run out.  After discharge, you should have regular check-up appointments with your healthcare provider that is prescribing your Xarelto.  In the future your dose may need to be changed if your kidney function or weight changes by a significant amount.  What do you do if you miss a dose? If you are taking Xarelto ONCE DAILY and you miss a dose, take it as soon as you remember on the same day then continue your regularly scheduled once daily regimen the next day. Do not take two doses of Xarelto at the same time  or on the same day.   Important Safety Information A possible side effect of Xarelto is bleeding. You should call your healthcare provider right away if you experience any of the following: ? Bleeding from an injury or your nose that does not stop. ? Unusual colored urine (red or dark brown) or unusual colored stools (red or black). ? Unusual bruising for unknown reasons. ? A serious fall or if you hit your head (even if there is no bleeding).  Some medicines may interact with Xarelto and might increase your risk of bleeding while on Xarelto. To help  avoid this, consult your healthcare provider or pharmacist prior to using any new prescription or non-prescription medications, including herbals, vitamins, non-steroidal anti-inflammatory drugs (NSAIDs) and supplements.  This website has more information on Xarelto: [email or web address]-[email or web address]

## 2020-08-15 NOTE — Progress Notes (Signed)
Pt refuses PIV anywhere but the hand. Jerks whenever attempt is made. Unsuccessful. Will report to primary RN.

## 2020-08-15 NOTE — Progress Notes (Signed)
Informed on-call physician of worsening mentation. Patient remains alert and oriented but concentration has become more poor and conversation is occasionally circular.

## 2020-08-15 NOTE — Discharge Summary (Signed)
Discharge Summary    Patient ID: [name] [name] MRN: [id number]; DOB: [birth year]  Admit date: 08/13/2020 Discharge date: 08/15/2020  Primary Care Provider: Georgann Housekeeper, MD  Primary Cardiologist: Donato Schultz, MD  Primary Electrophysiologist:  Lewayne Bunting, MD   Discharge Diagnoses    Principal Problem:   Atrial fibrillation with rapid ventricular response [facility]) Active Problems:   Essential hypertension   Coronary artery disease due to lipid rich plaque   S/P CABG x 2   CKD (chronic kidney disease), stage III (HCC)   Acute on chronic combined systolic and diastolic CHF (congestive heart failure) (HCC)    Diagnostic Studies/Procedures    None this admission _____________   History of Present Illness     [name] [name] is a 75 y.o. female with persistent atrial fibrillation, chronic combined CHF, CAD s/p CABG in 2016, HTN, HLD, DM type 2, COPD, GERD, and CKD stage 3, who presented to the ED 08/13/20 with complaints of DOE, orthopnea, and PND. She was seen by both Rudi Coco and Dr. Ladona Ridgel in the last week. She has been in afib RVR, recommended to start digoxin and consider AV node ablation with pacemaker. She had not yet started the digoxin prescribed to her. She decided 08/13/20 that she was tired of her symptoms and presented to the ER for further evaluation. ROS also positive for rectal bleeding, for which she believes she was prescribed pantoprazole, but she feels this has been making the bleeding worse. She missed at least one dose of Xarelto in the last few weeks, not sure exactly when. She is unsure of what other medications have been tried. She did not recall amiodarone, though on chart review it appears she was on this in 2019. Most recent notes state that amiodarone would like to be avoided due to history of COPD. Also not felt to be a good tikosyn candidate due to concerns for adherence. Remotely she has had bradycardia in sinus rhythm, and her medications have been adjusted  due to this in the past.   Hospital Course     Consultants: None   1. Atrial fibrillation with RVR: patient with HR's in the 120s on arrival. She was started on digoxin as previously recommended by Dr. Ladona Ridgel outpatient given that she had not initiated this medication prior to admission. She was continued on metoprolol succinate with dose increased to 100mg  BID (she was taking 100mg  qAM and 50mg  qPM). HR's improved with these changes.  - Continue digoxin and metoprolol succinate for rate control - Continue xarelto for stroke ppx - compliance encouraged - Message sent to EP scheduling to arrange close outpatient follow-up for ablation +/- PPM consideration.  2. Acute on chronic combined CHF: patient with EF 30-35% on echo 06/2020. BNP 864.  She was diuresed with IV lasix 40mg  x2 doses then transitioned to torsemide 40mg  daily. Weight on the day of discharge was 200.5lbs. Cr elevated on admission so losartan and spironolactone were held - Continue metoprolol succinate. - Continue torsemide 40mg  daily - Repeat BMET outpatient to determine if okay to restart losartan +/- spironolactone  3. CAD s/p CABG: no chest pain complaints. HsTrop with low flat trend in the 20s-30s. EKG non-ischemic. Not on aspirin due to need for anticoagulation - Continue statin - Continue BBlocker  4. HTN: BP stable off home losartan and spironolactone - Continue metoprolol succinate - Restart home losartan and spironolactone as needed for BP if Cr improved  5. HLD: no recent lipids on file - Continue atorvastatin  6. Acute on chronic renal insufficiency stage 3: Cr. 1.46 on admission though generally <1.3. Home losartan and spironolactone were held. - Repeat BMET outpatient for close monitoring  Did the patient have an acute coronary syndrome (MI, NSTEMI, STEMI, etc) this admission?:  No                               Did the patient have a percutaneous coronary intervention (stent / angioplasty)?:  No.        _____________  Discharge Vitals Blood pressure (!) 120/96, pulse 79, temperature 98.2 F (36.8 C), temperature source Oral, resp. rate 18, height 5\' 7"  (1.702 m), weight 90.9 kg, SpO2 91 %.  Filed Weights   08/14/20 1626 08/15/20 0034  Weight: 91.2 kg 90.9 kg    Labs & Radiologic Studies    CBC Recent Labs    08/13/20 1757 08/15/20 1139  WBC 10.1 9.4  NEUTROABS 7.0  --   HGB 13.5 14.0  HCT 42.8 44.9  MCV 84.8 84.6  PLT 153 156   Basic Metabolic Panel Recent Labs    71/06/26 1757 08/14/20 0326 08/15/20 1139  NA 140 142 142  K 3.8 3.9 3.9  CL 113* 112* 111  CO2 17* 19* 21*  GLUCOSE 110* 115* 119*  BUN 43* 41* 36*  CREATININE 1.46* 1.40* 1.32*  CALCIUM 8.7* 9.1 8.9  MG 2.0  --   --    Liver Function Tests Recent Labs    08/13/20 1757 08/14/20 0326  AST 34 32  ALT 41 40  ALKPHOS 187* 189*  BILITOT 1.2 1.2  PROT 6.0* 6.4*  ALBUMIN 3.1* 3.1*   No results for input(s): LIPASE, AMYLASE in the last 72 hours. High Sensitivity Troponin:   Recent Labs  Lab 08/13/20 1757  TROPONINIHS 24*    BNP Invalid input(s): POCBNP D-Dimer No results for input(s): DDIMER in the last 72 hours. Hemoglobin A1C Recent Labs    08/13/20 2102  HGBA1C 6.6*   Fasting Lipid Panel No results for input(s): CHOL, HDL, LDLCALC, TRIG, CHOLHDL, LDLDIRECT in the last 72 hours. Thyroid Function Tests Recent Labs    08/13/20 2102  TSH 1.246   _____________  DG Chest 2 View  Result Date: 08/03/2020 CLINICAL DATA:  Shortness of breath. EXAM: CHEST - 2 VIEW COMPARISON:  June 26, 2020 FINDINGS: The heart size is enlarged. The patient is status post prior median sternotomy. There is an atrial appendage clip in place. There is no focal infiltrate. Mild vascular congestion is noted. There are small bilateral pleural effusions. There is no definite acute osseous abnormality. IMPRESSION: Cardiomegaly with mild vascular congestion and small bilateral pleural effusions. Electronically  Signed   By: Katherine Mantle M.D.   On: 08/03/2020 16:06   DG Chest Portable 1 View  Result Date: 08/13/2020 CLINICAL DATA:  Shortness of breath EXAM: PORTABLE CHEST 1 VIEW COMPARISON:  08/03/2020 FINDINGS: Postoperative changes in the mediastinum. Shallow inspiration. Diffuse cardiac enlargement. Mildly prominent pulmonary vascularity with slight interstitial infiltrates in the bases probably representing edema. No pleural effusions. No pneumothorax. Mediastinal contours appear intact. Calcification of the aorta. Degenerative changes in the spine and shoulders. IMPRESSION: Cardiac enlargement with mild pulmonary vascular congestion and interstitial edema. Similar appearance to previous study. Electronically Signed   By: Burman Nieves M.D.   On: 08/13/2020 18:35   Disposition   Pt is being discharged home today in good condition.  Follow-up Plans & Appointments  Follow-up Information    Evans Lance, MD Follow up.   Specialty: Cardiology Why: Our office will contact you with an appointment date/time for close follow-up. If you do not hear from them by 08/19/20, please call to follow-up.  Contact information: [id number] [place] [address] [phone]              Discharge Instructions    Diet - low sodium heart healthy   Complete by: As directed    Increase activity slowly   Complete by: As directed       Discharge Medications   Allergies as of 08/15/2020      Reactions   Bee Venom Anaphylaxis   Ivp Dye [iodinated Diagnostic Agents] Anaphylaxis   Codeine Nausea And Vomiting   Entresto [sacubitril-valsartan] Other (See Comments)   Chest pain    Shrimp [shellfish Allergy] Swelling   Jardiance [empagliflozin] Other (See Comments)   Caused Boils      Medication List    STOP taking these medications   furosemide 20 MG tablet Commonly known as: Lasix   losartan 25 MG tablet Commonly known as: COZAAR   spironolactone 25 MG  tablet Commonly known as: ALDACTONE     TAKE these medications   acetaminophen 500 MG tablet Commonly known as: TYLENOL Take 1,000 mg by mouth every 6 (six) hours as needed for moderate pain.   allopurinol 300 MG tablet Commonly known as: ZYLOPRIM Take 1 tablet (300 mg total) by mouth daily.   atorvastatin 10 MG tablet Commonly known as: LIPITOR Take 10 mg by mouth at bedtime.   digoxin 0.125 MG tablet Commonly known as: LANOXIN Take 0.5 tablets (0.0625 mg total) by mouth daily.   metFORMIN 500 MG tablet Commonly known as: GLUCOPHAGE Take 500 mg by mouth daily.   metoprolol succinate 100 MG 24 hr tablet Commonly known as: TOPROL-XL Take 1 tablet (100 mg total) by mouth 2 (two) times daily. What changed:   how much to take  how to take this  when to take this  additional instructions   pantoprazole 40 MG tablet Commonly known as: PROTONIX Take 40 mg by mouth daily.   rivaroxaban 20 MG Tabs tablet Commonly known as: XARELTO Take 1 tablet (20 mg total) by mouth daily with supper. Resume from 11/01/2019 What changed: additional instructions   torsemide 20 MG tablet Commonly known as: DEMADEX Take 2 tablets (40 mg total) by mouth daily. Start taking on: August 16, 2020          Outstanding Labs/Studies   BMET at follow-up  Duration of Discharge Encounter   Greater than 30 minutes including physician time.  Signed, Abigail Butts, PA-C 08/15/2020, 5:21 PM

## 2020-08-15 NOTE — Progress Notes (Signed)
D/C instructions given and reviewed. No questions asked but encouraged to call. IV removed, tolerated well.

## 2020-08-15 NOTE — Plan of Care (Signed)

## 2020-08-18 ENCOUNTER — Other Ambulatory Visit: Payer: Self-pay

## 2020-08-18 ENCOUNTER — Ambulatory Visit: Payer: HMO | Admitting: Internal Medicine

## 2020-08-24 ENCOUNTER — Ambulatory Visit: Payer: HMO

## 2020-08-25 DIAGNOSIS — E119 Type 2 diabetes mellitus without complications: Secondary | ICD-10-CM | POA: Diagnosis not present

## 2020-08-25 DIAGNOSIS — H2513 Age-related nuclear cataract, bilateral: Secondary | ICD-10-CM | POA: Diagnosis not present

## 2020-08-27 ENCOUNTER — Ambulatory Visit: Payer: HMO | Admitting: Physician Assistant

## 2020-08-27 NOTE — Progress Notes (Deleted)
Cardiology Office Note Date:  08/27/2020  Patient ID:  [name] [name] [birth year], MRN [id number] PCP:  Georgann Housekeeper, MD  Cardiologist:  Dr. Anne Fu Electrophysiologist: Dr. Ladona Ridgel  ***refresh   Chief Complaint: *** post hospital  History of Present Illness: [name] [name] is a 75 y.o. female with history of CAD (CABG 2016), COPD, CKD (III), ICM w/improved LVEF >> chronic diastolic CHF, HTN, HLD, persistent AFib.  She was recently referred to Dr. Ladona Ridgel via the Afib clinic for uncontrolled AFib, he saw her 08/11/20, with escalating HF symptoms, he recommended AV node ablation/pacing, though the patient had not been taking her digoxin and wanted to try that with her high dose BB 1st.  She presented to the ER 08/13/20 with on going symptoms, still had not started the dig and remained in RVR. Started on dig and diuretics, echo done prior to her stay with EF back down. Mention of a missed xarelto dose, and Dr. Ladona Ridgel did not feel DCCV held any long term utility.  Pt reported some degree of noted rectal bleeding as well, CBC was stable, not anemic.  She was discharged 08/15/20. Home losartan and spironolactone held 2/2 AKI to have BMET at follow up. Mild HS trop that were flat, not felt ischemic.   *** rate *** volume *** pace/ablate?? *** taking meds? *** bleeding?? xarelto *** BNET today  Device information ***   Past Medical History:  Diagnosis Date  . (HFimpEF) heart failure with improved ejection fraction (HCC)    a. 07/2018 Echo: EF 30-35%; b. 11/2019 Echo: EF 55-60%, no rwma, Gr2 DD, Nl RV size/fxn. Mild BAE. Mild MR/AI.  Marland Kitchen Arthritis   . Asthma   . Atopic dermatitis   . CAD (coronary artery disease)    a. 2016 s/p CABG x 2 (LIMA->LAD, VG->OM); b. 08/2018 MV: EF 44%, no ischemia/infact.  . Cardiac arrest (HCC) 10/2014  . Cardiomyopathy, ischemic    a. 07/2018 Echo: EF 30-35%; 11/2019 Echo: EF 55-60%.  . Carotid arterial disease (HCC)    a. 11/2019 Carotid U/S  . CKD  (chronic kidney disease), stage III (HCC)   . COPD (chronic obstructive pulmonary disease) (HCC)   . Esophageal dilatation 2013  . GERD (gastroesophageal reflux disease)   . Gout   . Headache   . Hypertension   . Hypokalemia   . Idiopathic angioedema   . LGI bleed 08/06/2017   a. felt to be hemorrhoidal during that admission (no drop in Hgb).  . Lower back pain   . Paroxysmal atrial fibrillation (HCC)    a. Dx 2016-->h/o difficult to control rates (complicated by noncompliance), not felt to be a candidate for ablation or antiarrhythmic due to noncompliance; b. Recurrent AF 2021 - converted w/ IV dilt; c. CHA2DS2VASc = 7-->Xarelto.  . Personal history of noncompliance with medical treatment, presenting hazards to health   . Prediabetes   . S/P CABG x 2 with clipping of LA appendage 11/14/2014   LIMA to LAD, SVG to OM, EVH via right thigh  . Urine incontinence   . Uterine fibroid     Past Surgical History:  Procedure Laterality Date  . BALLOON DILATION N/A 10/10/2019   Procedure: BALLOON DILATION;  Surgeon: Kathi Der, MD;  Location: [facility];  Service: Gastroenterology;  Laterality: N/A;  . BIOPSY  10/10/2019   Procedure: BIOPSY;  Surgeon: Kathi Der, MD;  Location: [facility];  Service: Gastroenterology;;  . BIOPSY  10/11/2019   Procedure: BIOPSY;  Surgeon: Kathi Der, MD;  Location: [place] [facility];  Service: Gastroenterology;;  . CARDIOVERSION N/A 11/18/2014   Procedure: CARDIOVERSION;  Surgeon: [name], MD;  Location: [place];  Service: Cardiovascular;  Laterality: N/A;  . CARDIOVERSION N/A 12/25/2017   Procedure: CARDIOVERSION;  Surgeon: [name], MD;  Location: [place];  Service: Cardiovascular;  Laterality: N/A;  . CLIPPING OF ATRIAL APPENDAGE N/A 11/14/2014   Procedure: CLIPPING OF ATRIAL APPENDAGE;  Surgeon: [name], MD;  Location: [place];  Service: Open Heart Surgery;  Laterality: N/A;  . COLONOSCOPY  2013  . COLONOSCOPY WITH  PROPOFOL N/A 10/11/2019   Procedure: COLONOSCOPY WITH PROPOFOL;  Surgeon: Otis Brace, MD;  Location: [place];  Service: Gastroenterology;  Laterality: N/A;  . CORONARY ARTERY BYPASS GRAFT N/A 11/14/2014   Procedure: CORONARY ARTERY BYPASS GRAFTING (CABG)TIMES 2 USING LEFT INTERNAL MAMMARY ARTERY AND RIGHT SAPHENOUS VEIN HARVESTED ENDOSCOPICALLY;  Surgeon: [name], MD;  Location: [place];  Service: Open Heart Surgery;  Laterality: N/A;  . ESOPHAGOGASTRODUODENOSCOPY (EGD) WITH PROPOFOL N/A 10/10/2019   Procedure: ESOPHAGOGASTRODUODENOSCOPY (EGD) WITH PROPOFOL;  Surgeon: Otis Brace, MD;  Location: [facility];  Service: Gastroenterology;  Laterality: N/A;  . ESOPHAGOGASTRODUODENOSCOPY (EGD) WITH PROPOFOL N/A 10/27/2019   Procedure: ESOPHAGOGASTRODUODENOSCOPY (EGD) WITH PROPOFOL;  Surgeon: Ronnette Juniper, MD;  Location: [place];  Service: Gastroenterology;  Laterality: N/A;  . GIVENS CAPSULE STUDY N/A 10/27/2019   Procedure: GIVENS CAPSULE STUDY;  Surgeon: Ronnette Juniper, MD;  Location: [place];  Service: Gastroenterology;  Laterality: N/A;  . LEFT HEART CATHETERIZATION WITH CORONARY ANGIOGRAM N/A 11/04/2014   Procedure: LEFT HEART CATHETERIZATION WITH CORONARY ANGIOGRAM;  Surgeon: Troy Sine, MD;  Location[facility];  Service: Cardiovascular;  Laterality: N/A;  . POLYPECTOMY  10/11/2019   Procedure: POLYPECTOMY;  Surgeon: Otis Brace, MD;  Location: [place] [facility];  Service: Gastroenterology;;  . TEE WITHOUT CARDIOVERSION N/A 11/14/2014   Procedure: TRANSESOPHAGEAL ECHOCARDIOGRAM (TEE);  Surgeon: [name], MD;  Location: [place];  Service: Open Heart Surgery;  Laterality: N/A;  . TEE WITHOUT CARDIOVERSION N/A 12/25/2017   Procedure: TRANSESOPHAGEAL ECHOCARDIOGRAM (TEE);  Surgeon: [name], MD;  Location: [facility] ENDOSCOPY;  Service: Cardiovascular;  Laterality: N/A;  . TEMPORARY PACEMAKER INSERTION  11/04/2014   Procedure: TEMPORARY PACEMAKER INSERTION;   Surgeon: Troy Sine, MD;  Location: [facility] CATH LAB;  Service: Cardiovascular;;  . TOOTH EXTRACTION      Current Outpatient Medications  Medication Sig Dispense Refill  . acetaminophen (TYLENOL) 500 MG tablet Take 1,000 mg by mouth every 6 (six) hours as needed for moderate pain.    Marland Kitchen allopurinol (ZYLOPRIM) 300 MG tablet Take 1 tablet (300 mg total) by mouth daily. 30 tablet 11  . atorvastatin (LIPITOR) 10 MG tablet Take 10 mg by mouth at bedtime.    . digoxin (LANOXIN) 0.125 MG tablet Take 0.5 tablets (0.0625 mg total) by mouth daily. 45 tablet 3  . metFORMIN (GLUCOPHAGE) 500 MG tablet Take 500 mg by mouth daily.    . metoprolol succinate (TOPROL-XL) 100 MG 24 hr tablet Take 1 tablet (100 mg total) by mouth 2 (two) times daily. 180 tablet 3  . pantoprazole (PROTONIX) 40 MG tablet Take 40 mg by mouth daily.    . rivaroxaban (XARELTO) 20 MG TABS tablet Take 1 tablet (20 mg total) by mouth daily with supper. Resume from 11/01/2019 (Patient taking differently: Take 20 mg by mouth daily with supper.)    . torsemide (DEMADEX) 20 MG tablet Take 2 tablets (40 mg total) by  mouth daily. 30 tablet 6   No current facility-administered medications for this visit.    Allergies:   Bee venom, Ivp dye [iodinated diagnostic agents], Codeine, Entresto [sacubitril-valsartan], Shrimp [shellfish allergy], and Jardiance [empagliflozin]   Social History:  The patient  reports that she has been smoking cigarettes. She has a 28.00 pack-year smoking history. She has never used smokeless tobacco. She reports that she does not drink alcohol and does not use drugs.   Family History:  [name] family history includes CVA in her sister; Cancer in her mother; Diabetes in her brother; Heart disease in her father; Other in her father; Prostate cancer (age of onset: 12) in her brother.***  ROS:  Please see the history of present illness.    All other systems are reviewed and otherwise negative.   PHYSICAL EXAM:  VS:   There were no vitals taken for this visit. BMI: There is no height or weight on file to calculate BMI. Well nourished, well developed, in no acute distress HEENT: normocephalic, atraumatic Neck: no JVD, carotid bruits or masses Cardiac:  *** RRR; no significant murmurs, no rubs, or gallops Lungs:  *** CTA b/l, no wheezing, rhonchi or rales Abd: soft, nontender MS: no deformity or *** atrophy Ext: *** no edema Skin: warm and dry, no rash Neuro:  No gross deficits appreciated Psych: euthymic mood, full affect  *** PPM/ICD site is stable, no tethering or discomfort   EKG:  Done today and reviewed by myself shows  ***   06/27/2020: TTE IMPRESSIONS  1. Global hypokinesis with akinesis of the inferior and inferolateral  wall.  2. Left ventricular ejection fraction, by estimation, is 30 to 35%. The  left ventricle has moderate to severely decreased function. The left  ventricle demonstrates regional wall motion abnormalities (see scoring  diagram/findings for description). There  is moderate left ventricular hypertrophy. Left ventricular diastolic  function could not be evaluated.  3. Right ventricular systolic function is normal. The right ventricular  size is normal. There is normal pulmonary artery systolic pressure.  4. Left atrial size was severely dilated.  5. Right atrial size was moderately dilated.  6. The mitral valve is normal in structure. Mild mitral valve  regurgitation. No evidence of mitral stenosis.  7. The aortic valve is tricuspid. Aortic valve regurgitation is trivial.  Mild to moderate aortic valve sclerosis/calcification is present, without  any evidence of aortic stenosis.  8. The inferior vena cava is dilated in size with >50% respiratory  variability, suggesting right atrial pressure of 8 mmHg.      09/07/2018: stress myoview  Nuclear stress EF: 44%.  There was no ST segment deviation noted during stress.  The study is normal.  This is a  low risk study.  The left ventricular ejection fraction is moderately decreased (30-44%).     Recent Labs: 08/13/2020: B Natriuretic Peptide 864.3; Magnesium 2.0; TSH 1.246 08/14/2020: ALT 40 08/15/2020: BUN 36; Creatinine, Ser 1.32; Hemoglobin 14.0; Platelets 156; Potassium 3.9; Sodium 142  No results found for requested labs within last 8760 hours.   Estimated Creatinine Clearance: 42.6 mL/min (A) (by C-G formula based on SCr of 1.32 mg/dL (H)).   Wt Readings from Last 3 Encounters:  08/15/20 200 lb 8 oz (90.9 kg)  08/11/20 207 lb 12.8 oz (94.3 kg)  08/11/20 206 lb (93.4 kg)     Other studies reviewed: Additional studies/records reviewed today include: summarized above  ASSESSMENT AND PLAN:  1. permananet Afib     CHA2DS2Vasc  is 6, on Xarelto, *** appropriately dosed     ***  2. CAD     ***  3. Mixed ischemic/NICM 4. chronic CHF     ***  5. HTN     ***   Disposition: F/u with ***  Current medicines are reviewed at length with the patient today.  The patient did not have any concerns regarding medicines.  Venetia Night, PA-C 08/27/2020 5:41 AM     [facility] [address] [place] [address] [phone] (office)  [phone] (fax)

## 2020-09-01 ENCOUNTER — Institutional Professional Consult (permissible substitution): Payer: HMO | Admitting: Internal Medicine

## 2020-09-10 DIAGNOSIS — I509 Heart failure, unspecified: Secondary | ICD-10-CM | POA: Diagnosis not present

## 2020-09-10 DIAGNOSIS — I4891 Unspecified atrial fibrillation: Secondary | ICD-10-CM | POA: Diagnosis not present

## 2020-09-10 DIAGNOSIS — K219 Gastro-esophageal reflux disease without esophagitis: Secondary | ICD-10-CM | POA: Diagnosis not present

## 2020-09-10 DIAGNOSIS — M1990 Unspecified osteoarthritis, unspecified site: Secondary | ICD-10-CM | POA: Diagnosis not present

## 2020-09-10 DIAGNOSIS — I2510 Atherosclerotic heart disease of native coronary artery without angina pectoris: Secondary | ICD-10-CM | POA: Diagnosis not present

## 2020-09-10 DIAGNOSIS — I214 Non-ST elevation (NSTEMI) myocardial infarction: Secondary | ICD-10-CM | POA: Diagnosis not present

## 2020-09-10 DIAGNOSIS — J449 Chronic obstructive pulmonary disease, unspecified: Secondary | ICD-10-CM | POA: Diagnosis not present

## 2020-09-10 DIAGNOSIS — E1122 Type 2 diabetes mellitus with diabetic chronic kidney disease: Secondary | ICD-10-CM | POA: Diagnosis not present

## 2020-09-10 DIAGNOSIS — I10 Essential (primary) hypertension: Secondary | ICD-10-CM | POA: Diagnosis not present

## 2020-09-10 DIAGNOSIS — E7800 Pure hypercholesterolemia, unspecified: Secondary | ICD-10-CM | POA: Diagnosis not present

## 2020-09-10 DIAGNOSIS — N183 Chronic kidney disease, stage 3 unspecified: Secondary | ICD-10-CM | POA: Diagnosis not present

## 2020-09-14 ENCOUNTER — Institutional Professional Consult (permissible substitution): Payer: HMO | Admitting: Internal Medicine

## 2020-09-15 ENCOUNTER — Institutional Professional Consult (permissible substitution): Payer: HMO | Admitting: Internal Medicine

## 2020-09-16 ENCOUNTER — Other Ambulatory Visit: Payer: Self-pay

## 2020-09-16 ENCOUNTER — Encounter: Payer: Self-pay | Admitting: Cardiology

## 2020-09-16 ENCOUNTER — Ambulatory Visit (INDEPENDENT_AMBULATORY_CARE_PROVIDER_SITE_OTHER): Payer: HMO | Admitting: Cardiology

## 2020-09-16 VITALS — BP 112/60 | HR 131 | Ht 67.0 in | Wt 195.0 lb

## 2020-09-16 DIAGNOSIS — I5043 Acute on chronic combined systolic (congestive) and diastolic (congestive) heart failure: Secondary | ICD-10-CM

## 2020-09-16 DIAGNOSIS — I4819 Other persistent atrial fibrillation: Secondary | ICD-10-CM

## 2020-09-16 NOTE — Progress Notes (Signed)
Cardiology Office Note:    Date:  09/16/2020   ID:  [name], [name] 09/30/1945, MRN [id number]  PCP:  [name] [name]  [facility] Cardiologist:  [name], [name]  [facility] Electrophysiologist:  [name], [name]   Referring [name]: [name] [name]     History of Present Illness:    [name] [name] is a 75 y.o. female here for the follow-up of persistent atrial fibrillation.  CAD cardiomyopathy.  She was recently in the hospital, discharged on 08/15/2020.  Discharge summary reviewed.  See below for details:  Persistent atrial fibrillation, chronic combined CHF, CAD s/p CABG in 2016, HTN, HLD, DM type 2, COPD, GERD, and CKD stage 3, who presented to the ED 08/13/20 with complaints of DOE, orthopnea, and PND. She was seen by both Roderic Palau and Dr. Lovena Le in the last week. She has been in afib RVR, recommended to start digoxin and consider AV node ablation with pacemaker. She had not yet started the digoxin prescribed to her. She decided 08/13/20 that she was tired of her symptoms and presented to the ER for further evaluation. ROS also positive for rectal bleeding, for which she believes she was prescribed pantoprazole, but she feels this has been making the bleeding worse. She missed at least one dose of Xarelto in the last few weeks, not sure exactly when. She is unsure of what other medications have been tried. She did not recall amiodarone, though on chart review it appears she was on this in 2019. Most recent notes state that amiodarone would like to be avoided due to history of COPD. Also not felt to be a good tikosyn candidate due to concerns for adherence. Remotely she has had bradycardia in sinus rhythm, and her medications have been adjusted due to this in the past.  Please see below for details.  Past Medical History:  Diagnosis Date  . (HFimpEF) heart failure with improved ejection fraction ([place])    a. 07/2018 Echo: EF 30-35%; b. 11/2019 Echo: EF 55-60%, no rwma, Gr2  DD, Nl RV size/fxn. Mild BAE. Mild MR/AI.  Marland Kitchen Arthritis   . Asthma   . Atopic dermatitis   . CAD (coronary artery disease)    a. 2016 s/p CABG x 2 (LIMA->LAD, VG->OM); b. 08/2018 MV: EF 44%, no ischemia/infact.  . Cardiac arrest ([place]) 10/2014  . Cardiomyopathy, ischemic    a. 07/2018 Echo: EF 30-35%; 11/2019 Echo: EF 55-60%.  . Carotid arterial disease ([place])    a. 11/2019 Carotid U/S  . CKD (chronic kidney disease), stage III ([place])   . COPD (chronic obstructive pulmonary disease) ([place])   . Esophageal dilatation 2013  . GERD (gastroesophageal reflux disease)   . Gout   . Headache   . Hypertension   . Hypokalemia   . Idiopathic angioedema   . LGI bleed 08/06/2017   a. felt to be hemorrhoidal during that admission (no drop in Hgb).  . Lower back pain   . Paroxysmal atrial fibrillation ([place])    a. Dx 2016-->h/o difficult to control rates (complicated by noncompliance), not felt to be a candidate for ablation or antiarrhythmic due to noncompliance; b. Recurrent AF 2021 - converted w/ IV dilt; c. CHA2DS2VASc = 7-->Xarelto.  . Personal history of noncompliance with medical treatment, presenting hazards to health   . Prediabetes   . S/P CABG x 2 with clipping of LA appendage 11/14/2014   LIMA to LAD, SVG to OM, EVH via right thigh  . Urine incontinence   .  Uterine fibroid     Past Surgical History:  Procedure Laterality Date  . BALLOON DILATION N/A 10/10/2019   Procedure: BALLOON DILATION;  Surgeon: Otis Brace, [name];  Location: [facility];  Service: Gastroenterology;  Laterality: N/A;  . BIOPSY  10/10/2019   Procedure: BIOPSY;  Surgeon: Otis Brace, [name];  Location: [place];  Service: Gastroenterology;;  . BIOPSY  10/11/2019   Procedure: BIOPSY;  Surgeon: Otis Brace, [name];  Location: [place];  Service: Gastroenterology;;  . CARDIOVERSION N/A 11/18/2014   Procedure: CARDIOVERSION;  Surgeon: [name], [name];  Location: [place];  Service: Cardiovascular;  Laterality:  N/A;  . CARDIOVERSION N/A 12/25/2017   Procedure: CARDIOVERSION;  Surgeon: [name], [name];  Location: [place];  Service: Cardiovascular;  Laterality: N/A;  . CLIPPING OF ATRIAL APPENDAGE N/A 11/14/2014   Procedure: CLIPPING OF ATRIAL APPENDAGE;  Surgeon: [name], [name];  Location: [place];  Service: Open Heart Surgery;  Laterality: N/A;  . COLONOSCOPY  2013  . COLONOSCOPY WITH PROPOFOL N/A 10/11/2019   Procedure: COLONOSCOPY WITH PROPOFOL;  Surgeon: Otis Brace, [name];  Location: [place];  Service: Gastroenterology;  Laterality: N/A;  . CORONARY ARTERY BYPASS GRAFT N/A 11/14/2014   Procedure: CORONARY ARTERY BYPASS GRAFTING (CABG)TIMES 2 USING LEFT INTERNAL MAMMARY ARTERY AND RIGHT SAPHENOUS VEIN HARVESTED ENDOSCOPICALLY;  Surgeon: [name], [name];  Location: [place];  Service: Open Heart Surgery;  Laterality: N/A;  . ESOPHAGOGASTRODUODENOSCOPY (EGD) WITH PROPOFOL N/A 10/10/2019   Procedure: ESOPHAGOGASTRODUODENOSCOPY (EGD) WITH PROPOFOL;  Surgeon: Otis Brace, [name];  Location: [facility];  Service: Gastroenterology;  Laterality: N/A;  . ESOPHAGOGASTRODUODENOSCOPY (EGD) WITH PROPOFOL N/A 10/27/2019   Procedure: ESOPHAGOGASTRODUODENOSCOPY (EGD) WITH PROPOFOL;  Surgeon: Ronnette Juniper, [name];  Location: [place];  Service: Gastroenterology;  Laterality: N/A;  . GIVENS CAPSULE STUDY N/A 10/27/2019   Procedure: GIVENS CAPSULE STUDY;  Surgeon: Ronnette Juniper, [name];  Location: [place];  Service: Gastroenterology;  Laterality: N/A;  . LEFT HEART CATHETERIZATION WITH CORONARY ANGIOGRAM N/A 11/04/2014   Procedure: LEFT HEART CATHETERIZATION WITH CORONARY ANGIOGRAM;  Surgeon: Troy Sine, [name];  Location: [facility] CATH LAB;  Service: Cardiovascular;  Laterality: N/A;  . POLYPECTOMY  10/11/2019   Procedure: POLYPECTOMY;  Surgeon: Otis Brace, [name];  Location: [place] [facility];  Service: Gastroenterology;;  . TEE WITHOUT CARDIOVERSION N/A 11/14/2014   Procedure: TRANSESOPHAGEAL ECHOCARDIOGRAM  (TEE);  Surgeon: [name], [name];  Location: [place];  Service: Open Heart Surgery;  Laterality: N/A;  . TEE WITHOUT CARDIOVERSION N/A 12/25/2017   Procedure: TRANSESOPHAGEAL ECHOCARDIOGRAM (TEE);  Surgeon: [name], [name];  Location: [facility] ENDOSCOPY;  Service: Cardiovascular;  Laterality: N/A;  . TEMPORARY PACEMAKER INSERTION  11/04/2014   Procedure: TEMPORARY PACEMAKER INSERTION;  Surgeon: Troy Sine, [name];  Location: [facility] CATH LAB;  Service: Cardiovascular;;  . TOOTH EXTRACTION      Current Medications: Current Meds  Medication Sig  . acetaminophen (TYLENOL) 500 MG tablet Take 1,000 mg by mouth every 6 (six) hours as needed for moderate pain.  Marland Kitchen allopurinol (ZYLOPRIM) 300 MG tablet Take 1 tablet (300 mg total) by mouth daily.  Marland Kitchen atorvastatin (LIPITOR) 10 MG tablet Take 10 mg by mouth at bedtime.  . digoxin (LANOXIN) 0.125 MG tablet Take 0.5 tablets (0.0625 mg total) by mouth daily.  . metFORMIN (GLUCOPHAGE) 500 MG tablet Take 500 mg by mouth daily.  . metoprolol succinate (TOPROL-XL) 100 MG 24 hr tablet Take 1 tablet (100 mg total) by mouth 2 (two) times daily.  . pantoprazole (PROTONIX) 40  MG tablet Take 40 mg by mouth daily.  . rivaroxaban (XARELTO) 20 MG TABS tablet Take 1 tablet (20 mg total) by mouth daily with supper. Resume from 11/01/2019  . torsemide (DEMADEX) 20 MG tablet Take 2 tablets (40 mg total) by mouth daily.     Allergies:   Bee venom, Ivp dye [iodinated diagnostic agents], Codeine, Entresto [sacubitril-valsartan], Shrimp [shellfish allergy], and Jardiance [empagliflozin]   Social History   Socioeconomic History  . Marital status: Widowed    Spouse name: Not on file  . Number of children: 1  . Years of education: 50  . Highest education level: Not on file  Occupational History  . Occupation: RETIRED [employer]  Tobacco Use  . Smoking status: Current Every Day Smoker    Packs/day: 0.50    Years: 56.00    Pack years: 28.00    Types: Cigarettes   . Smokeless tobacco: Never Used  . Tobacco comment: She smokes 1/2 pack daily  Vaping Use  . Vaping Use: Never used  Substance and Sexual Activity  . Alcohol use: No    Alcohol/week: 0.0 standard drinks  . Drug use: No  . Sexual activity: Not on file  Other Topics Concern  . Not on file  Social History Narrative   Patient reports it being difficult to pay for everything due to outstanding medical bills from hospital stay last year but states she is able to keep up with basic expenses.  Patient does have some concerns with her house's condition due to a water leak- had her roof repaired last month but has some leaking in the front which has affected her porch.  Patient owns her own car and is able to drive herself but has some concerns about the reliability of her car- has gotten taxi to the clinic for appointment in the past when her car broke down.   Social Determinants of Health   Financial Resource Strain: Not on file  Food Insecurity: Not on file  Transportation Needs: Not on file  Physical Activity: Not on file  Stress: Not on file  Social Connections: Not on file     Family History: [name] family history includes CVA in her sister; Cancer in her mother; Diabetes in her brother; Heart disease in her father; Other in her father; Prostate cancer (age of onset: 57) in her brother.  ROS:   Please see the history of present illness.     All other systems reviewed and are negative.  EKGs/Labs/Other Studies Reviewed:    The following studies were reviewed today:   Echocardiogram 06/27/20: 1. Global hypokinesis with akinesis of the inferior and inferolateral  wall.  2. Left ventricular ejection fraction, by estimation, is 30 to 35%. The  left ventricle has moderate to severely decreased function. The left  ventricle demonstrates regional wall motion abnormalities (see scoring  diagram/findings for description). There  is moderate left ventricular hypertrophy. Left  ventricular diastolic  function could not be evaluated.  3. Right ventricular systolic function is normal. The right ventricular  size is normal. There is normal pulmonary artery systolic pressure.  4. Left atrial size was severely dilated.  5. Right atrial size was moderately dilated.  6. The mitral valve is normal in structure. Mild mitral valve  regurgitation. No evidence of mitral stenosis.  7. The aortic valve is tricuspid. Aortic valve regurgitation is trivial.  Mild to moderate aortic valve sclerosis/calcification is present, without  any evidence of aortic stenosis.  8. The inferior  vena cava is dilated in size with >50% respiratory  variability, suggesting right atrial pressure of 8 mmHg.  _____________ Recent Labs: 08/13/2020: B Natriuretic Peptide 864.3; Magnesium 2.0; TSH 1.246 08/14/2020: ALT 40 08/15/2020: BUN 36; Creatinine, Ser 1.32; Hemoglobin 14.0; Platelets 156; Potassium 3.9; Sodium 142  Recent Lipid Panel    Component Value Date/Time   CHOL 116 11/12/2014 0432   TRIG 111 11/12/2014 0432   HDL 34 (L) 11/12/2014 0432   CHOLHDL 3.4 11/12/2014 0432   VLDL 22 11/12/2014 0432   LDLCALC 60 11/12/2014 0432     Risk Assessment/Calculations:       Physical Exam:    VS:  BP 112/60 (BP Location: Left Arm, Patient Position: Sitting, Cuff Size: Normal)   Pulse (!) 56   Ht 5\' 7"  (1.702 m)   Wt 195 lb (88.5 kg)   SpO2 96%   BMI 30.54 kg/m     Wt Readings from Last 3 Encounters:  09/16/20 195 lb (88.5 kg)  08/15/20 200 lb 8 oz (90.9 kg)  08/11/20 207 lb 12.8 oz (94.3 kg)     GEN:  Well nourished, well developed in no acute distress HEENT: Normal NECK: No JVD; No carotid bruits LYMPHATICS: No lymphadenopathy CARDIAC: irreg rate controlled, no murmurs, rubs, gallops RESPIRATORY:  Clear to auscultation without rales, wheezing or rhonchi  ABDOMEN: Soft, non-tender, non-distended MUSCULOSKELETAL:  No edema; No deformity  SKIN: Warm and dry NEUROLOGIC:   Alert and oriented x 3 PSYCHIATRIC:  Normal affect   ASSESSMENT:    No diagnosis found. PLAN:    In order of problems listed above:  Persistent atrial fibrillation with rapid ventricular response -Dr. Tanna Furry recommendations were reviewed from before.  She is going to talk with him or his team again about AV ablation plus pacemaker consideration.  Medication compliance has been an issue.  Even with the metoprolol and digoxin for heart rate still is 131 bpm.  I encouraged her to discuss once again AV nodal ablation and pacemaker.  She shakes her head.  I reiterated to her that her heart is weak.  Chronic anticoagulation -Continue with Xarelto hemoglobin 14  Chronic systolic heart failure -EF have fluctuated.  Some normal some low.  Seems to be tachycardia mediated.  Overall well rate controlled.  Has seen [name] in the past.  She was diuresed in the hospital with IV Lasix and transitioned back to torsemide 40 mg a day.  Weight on discharge was 200 pounds.  Her creatinine was elevated and losartan and spironolactone were held.  Metoprolol was continued.  Coronary artery disease -CABG 2016 nuclear stress test 2020 low risk.  Overall stable.  Mild demand ischemia with troponins in the 20s and 30s in the setting of A. fib.  Stage IIIb chronic kidney disease -Creatinine 1.5 creatinine 1.32 on discharge.  She is also having some burning on urination.  Asked her to discuss this with Dr. Lysle Rubens.  COPD -Pulmonary referral has taken place previously.  DM -It sounds like she had a Jardiance associated yeast infection.  Asked her to  talk to Dr. Lysle Rubens about alternatives.  Unfortunately, despite discussion in the hospital etc. and in clinic difficult to x-ray the importance of heart rate control in the setting of her atrial fibrillation which may be continuing the cycle of cardiomyopathy for her.      Medication Adjustments/Labs and Tests Ordered: Current medicines are reviewed at  length with the patient today.  Concerns regarding medicines are outlined above.  No orders  of the defined types were placed in this encounter.  No orders of the defined types were placed in this encounter.   There are no Patient Instructions on file for this visit.   Signed, [name], [name]  09/16/2020 3:13 PM    [place]

## 2020-09-16 NOTE — Patient Instructions (Signed)

## 2020-09-20 NOTE — Progress Notes (Deleted)
Cardiology Office Note Date:  09/20/2020  Patient ID:  [name] [name] Apr 06, 1946, MRN [id number] PCP:  [name], MD  Cardiologist:  [name] Electrophysiologist: Dr. Lovena Le  ***refresh   Chief Complaint: *** post hospital  History of Present Illness: [name] [name] is a 75 y.o. female with history of CAD (CABG 2016), COPD, CKD (III), ICM w/improved LVEF >> chronic diastolic CHF, HTN, HLD, persistent AFib.  She was recently referred to Dr. Lovena Le via the Afib clinic for uncontrolled AFib, he saw her 08/11/20, with escalating HF symptoms, he recommended AV node ablation/pacing, though the patient had not been taking her digoxin and wanted to try that with her high dose BB 1st.  She presented to the ER 08/13/20 with on going symptoms, still had not started the dig and remained in RVR. Started on dig and diuretics, echo done prior to her stay with EF back down. Mention of a missed xarelto dose, and Dr. Lovena Le did not feel DCCV held any long term utility.  Pt reported some degree of noted rectal bleeding as well, CBC was stable, not anemic.  She was discharged 08/15/20. Home losartan and spironolactone held 2/2 AKI to have BMET at follow up. Mild HS trop that were flat, not felt ischemic.  She saw [name] in f/u 09/16/20, rate was uncontrolled, and planned to keep EP follow up and consider AVnode ablation and PPM.no changes were made. CM suspected to be tachy-mediated  *** rate *** volume *** pace/ablate?? *** taking meds? *** bleeding?? xarelto *** BMET today  *** LVEF 30-35%, CRT? ICD?, narrow QRS     Past Medical History:  Diagnosis Date  . (HFimpEF) heart failure with improved ejection fraction ([place])    a. 07/2018 Echo: EF 30-35%; b. 11/2019 Echo: EF 55-60%, no rwma, Gr2 DD, Nl RV size/fxn. Mild BAE. Mild MR/AI.  Marland Kitchen Arthritis   . Asthma   . Atopic dermatitis   . CAD (coronary artery disease)    a. 2016 s/p CABG x 2 (LIMA->LAD, VG->OM); b. 08/2018 MV: EF 44%, no  ischemia/infact.  . Cardiac arrest ([place]) 10/2014  . Cardiomyopathy, ischemic    a. 07/2018 Echo: EF 30-35%; 11/2019 Echo: EF 55-60%.  . Carotid arterial disease ([place])    a. 11/2019 Carotid U/S  . CKD (chronic kidney disease), stage III ([place])   . COPD (chronic obstructive pulmonary disease) ([place])   . Esophageal dilatation 2013  . GERD (gastroesophageal reflux disease)   . Gout   . Headache   . Hypertension   . Hypokalemia   . Idiopathic angioedema   . LGI bleed 08/06/2017   a. felt to be hemorrhoidal during that admission (no drop in Hgb).  . Lower back pain   . Paroxysmal atrial fibrillation ([place])    a. Dx 2016-->h/o difficult to control rates (complicated by noncompliance), not felt to be a candidate for ablation or antiarrhythmic due to noncompliance; b. Recurrent AF 2021 - converted w/ IV dilt; c. CHA2DS2VASc = 7-->Xarelto.  . Personal history of noncompliance with medical treatment, presenting hazards to health   . Prediabetes   . S/P CABG x 2 with clipping of LA appendage 11/14/2014   LIMA to LAD, SVG to OM, EVH via right thigh  . Urine incontinence   . Uterine fibroid     Past Surgical History:  Procedure Laterality Date  . BALLOON DILATION N/A 10/10/2019   Procedure: BALLOON DILATION;  Surgeon: Otis Brace, MD;  Location: [facility];  Service: Gastroenterology;  Laterality:  N/A;  . BIOPSY  10/10/2019   Procedure: BIOPSY;  Surgeon: Otis Brace, MD;  Location: [place];  Service: Gastroenterology;;  . BIOPSY  10/11/2019   Procedure: BIOPSY;  Surgeon: Otis Brace, MD;  Location: [place];  Service: Gastroenterology;;  . CARDIOVERSION N/A 11/18/2014   Procedure: CARDIOVERSION;  Surgeon: [name], MD;  Location: [place];  Service: Cardiovascular;  Laterality: N/A;  . CARDIOVERSION N/A 12/25/2017   Procedure: CARDIOVERSION;  Surgeon: [name], MD;  Location: [place];  Service: Cardiovascular;  Laterality: N/A;  . CLIPPING OF ATRIAL  APPENDAGE N/A 11/14/2014   Procedure: CLIPPING OF ATRIAL APPENDAGE;  Surgeon: [name], MD;  Location: [place];  Service: Open Heart Surgery;  Laterality: N/A;  . COLONOSCOPY  2013  . COLONOSCOPY WITH PROPOFOL N/A 10/11/2019   Procedure: COLONOSCOPY WITH PROPOFOL;  Surgeon: Otis Brace, MD;  Location: [place];  Service: Gastroenterology;  Laterality: N/A;  . CORONARY ARTERY BYPASS GRAFT N/A 11/14/2014   Procedure: CORONARY ARTERY BYPASS GRAFTING (CABG)TIMES 2 USING LEFT INTERNAL MAMMARY ARTERY AND RIGHT SAPHENOUS VEIN HARVESTED ENDOSCOPICALLY;  Surgeon: [name], MD;  Location: [place];  Service: Open Heart Surgery;  Laterality: N/A;  . ESOPHAGOGASTRODUODENOSCOPY (EGD) WITH PROPOFOL N/A 10/10/2019   Procedure: ESOPHAGOGASTRODUODENOSCOPY (EGD) WITH PROPOFOL;  Surgeon: Otis Brace, MD;  [facility];  Service: Gastroenterology;  Laterality: N/A;  . ESOPHAGOGASTRODUODENOSCOPY (EGD) WITH PROPOFOL N/A 10/27/2019   Procedure: ESOPHAGOGASTRODUODENOSCOPY (EGD) WITH PROPOFOL;  Surgeon: Ronnette Juniper, MD;  Location: [place];  Service: Gastroenterology;  Laterality: N/A;  . GIVENS CAPSULE STUDY N/A 10/27/2019   Procedure: GIVENS CAPSULE STUDY;  Surgeon: Ronnette Juniper, MD;  Location: [place];  Service: Gastroenterology;  Laterality: N/A;  . LEFT HEART CATHETERIZATION WITH CORONARY ANGIOGRAM N/A 11/04/2014   Procedure: LEFT HEART CATHETERIZATION WITH CORONARY ANGIOGRAM;  Surgeon: Troy Sine, MD;  Location: [facility] CATH LAB;  Service: Cardiovascular;  Laterality: N/A;  . POLYPECTOMY  10/11/2019   Procedure: POLYPECTOMY;  Surgeon: Otis Brace, MD;  Location: [place] [facility]OSCOPY;  Service: Gastroenterology;;  . TEE WITHOUT CARDIOVERSION N/A 11/14/2014   Procedure: TRANSESOPHAGEAL ECHOCARDIOGRAM (TEE);  Surgeon: [name], MD;  Location: [place];  Service: Open Heart Surgery;  Laterality: N/A;  . TEE WITHOUT CARDIOVERSION N/A 12/25/2017   Procedure: TRANSESOPHAGEAL ECHOCARDIOGRAM  (TEE);  Surgeon: [name], MD;  [facility];  Service: Cardiovascular;  Laterality: N/A;  . TEMPORARY PACEMAKER INSERTION  11/04/2014   Procedure: TEMPORARY PACEMAKER INSERTION;  Surgeon: Troy Sine, MD;  Location: [facility] CATH LAB;  Service: Cardiovascular;;  . TOOTH EXTRACTION      Current Outpatient Medications  Medication Sig Dispense Refill  . acetaminophen (TYLENOL) 500 MG tablet Take 1,000 mg by mouth every 6 (six) hours as needed for moderate pain.    Marland Kitchen allopurinol (ZYLOPRIM) 300 MG tablet Take 1 tablet (300 mg total) by mouth daily. 30 tablet 11  . atorvastatin (LIPITOR) 10 MG tablet Take 10 mg by mouth at bedtime.    . digoxin (LANOXIN) 0.125 MG tablet Take 0.5 tablets (0.0625 mg total) by mouth daily. 45 tablet 3  . metFORMIN (GLUCOPHAGE) 500 MG tablet Take 500 mg by mouth daily.    . metoprolol succinate (TOPROL-XL) 100 MG 24 hr tablet Take 1 tablet (100 mg total) by mouth 2 (two) times daily. 180 tablet 3  . pantoprazole (PROTONIX) 40 MG tablet Take 40 mg by mouth daily.    . rivaroxaban (XARELTO) 20 MG TABS tablet Take 1 tablet (20  mg total) by mouth daily with supper. Resume from 11/01/2019    . torsemide (DEMADEX) 20 MG tablet Take 2 tablets (40 mg total) by mouth daily. 30 tablet 6   No current facility-administered medications for this visit.    Allergies:   Bee venom, Ivp dye [iodinated diagnostic agents], Codeine, Entresto [sacubitril-valsartan], Shrimp [shellfish allergy], and Jardiance [empagliflozin]   Social History:  The patient  reports that she has been smoking cigarettes. She has a 28.00 pack-year smoking history. She has never used smokeless tobacco. She reports that she does not drink alcohol and does not use drugs.   Family History:  [name] family history includes CVA in her sister; Cancer in her mother; Diabetes in her brother; Heart disease in her father; Other in her father; Prostate cancer (age of onset: 57) in her  brother.***  ROS:  Please see the history of present illness.    All other systems are reviewed and otherwise negative.   PHYSICAL EXAM:  VS:  There were no vitals taken for this visit. BMI: There is no height or weight on file to calculate BMI. Well nourished, well developed, in no acute distress HEENT: normocephalic, atraumatic Neck: no JVD, carotid bruits or masses Cardiac:  *** RRR; no significant murmurs, no rubs, or gallops Lungs:  *** CTA b/l, no wheezing, rhonchi or rales Abd: soft, nontender MS: no deformity or *** atrophy Ext: *** no edema Skin: warm and dry, no rash Neuro:  No gross deficits appreciated Psych: euthymic mood, full affect   EKG:  Done today and reviewed by myself shows  ***   06/27/2020: TTE IMPRESSIONS  1. Global hypokinesis with akinesis of the inferior and inferolateral  wall.  2. Left ventricular ejection fraction, by estimation, is 30 to 35%. The  left ventricle has moderate to severely decreased function. The left  ventricle demonstrates regional wall motion abnormalities (see scoring  diagram/findings for description). There  is moderate left ventricular hypertrophy. Left ventricular diastolic  function could not be evaluated.  3. Right ventricular systolic function is normal. The right ventricular  size is normal. There is normal pulmonary artery systolic pressure.  4. Left atrial size was severely dilated.  5. Right atrial size was moderately dilated.  6. The mitral valve is normal in structure. Mild mitral valve  regurgitation. No evidence of mitral stenosis.  7. The aortic valve is tricuspid. Aortic valve regurgitation is trivial.  Mild to moderate aortic valve sclerosis/calcification is present, without  any evidence of aortic stenosis.  8. The inferior vena cava is dilated in size with >50% respiratory  variability, suggesting right atrial pressure of 8 mmHg.    12/02/2019: TTE IMPRESSIONS  1. Left ventricular ejection  fraction, by estimation, is 55 to 60%. The  left ventricle has normal function. The left ventricle demonstrates  regional wall motion abnormalities (see scoring diagram/findings for  description). There is moderate left  ventricular hypertrophy. Left ventricular diastolic parameters are  consistent with Grade II diastolic dysfunction (pseudonormalization).  Elevated left ventricular end-diastolic pressure.  2. Right ventricular systolic function is normal. The right ventricular  size is normal. Tricuspid regurgitation signal is inadequate for assessing  PA pressure.  3. Left atrial size was mildly dilated.  4. Right atrial size was mildly dilated.  5. The mitral valve is abnormal. Mild mitral valve regurgitation. No  evidence of mitral stenosis.  6. The aortic valve is abnormal. Aortic valve regurgitation is mild. Mild  aortic valve sclerosis is present, with no evidence of  aortic valve  stenosis.  7. The inferior vena cava is dilated in size with >50% respiratory  variability, suggesting right atrial pressure of 8 mmHg.   LV Wall Scoring:  Hypokinesis of the lateral wall from base to mid ventricle.   10/10/19: LVEF 60-65%, no WMA   09/07/2018: stress myoview  Nuclear stress EF: 44%.  There was no ST segment deviation noted during stress.  The study is normal.  This is a low risk study.  The left ventricular ejection fraction is moderately decreased (30-44%).   08/03/2018: LVEF 30-35%, no WMA    Recent Labs: 08/13/2020: B Natriuretic Peptide 864.3; Magnesium 2.0; TSH 1.246 08/14/2020: ALT 40 08/15/2020: BUN 36; Creatinine, Ser 1.32; Hemoglobin 14.0; Platelets 156; Potassium 3.9; Sodium 142  No results found for requested labs within last 8760 hours.   CrCl cannot be calculated (Patient's most recent lab result is older than the maximum 21 days allowed.).   Wt Readings from Last 3 Encounters:  09/16/20 195 lb (88.5 kg)  08/15/20 200 lb 8 oz (90.9 kg)  08/11/20  207 lb 12.8 oz (94.3 kg)     Other studies reviewed: Additional studies/records reviewed today include: summarized above  ASSESSMENT AND PLAN:  1. permananet Afib     CHA2DS2Vasc is 6, on Xarelto, *** appropriately dosed     ***  2. CAD     ***  3. Mixed ischemic/NICM 4. chronic CHF     *** LVEF down  5. HTN     ***   Disposition: F/u with ***  Current medicines are reviewed at length with the patient today.  The patient did not have any concerns regarding medicines.  Venetia Night, PA-C 09/20/2020 7:10 PM     [place] [address] [place] [address] [phone] (office)  [phone] (fax)

## 2020-09-21 ENCOUNTER — Encounter: Payer: Self-pay | Admitting: Emergency Medicine

## 2020-09-21 ENCOUNTER — Ambulatory Visit (INDEPENDENT_AMBULATORY_CARE_PROVIDER_SITE_OTHER): Payer: HMO | Admitting: Emergency Medicine

## 2020-09-21 ENCOUNTER — Other Ambulatory Visit: Payer: Self-pay

## 2020-09-21 DIAGNOSIS — G4733 Obstructive sleep apnea (adult) (pediatric): Secondary | ICD-10-CM | POA: Insufficient documentation

## 2020-09-21 DIAGNOSIS — F17200 Nicotine dependence, unspecified, uncomplicated: Secondary | ICD-10-CM

## 2020-09-21 DIAGNOSIS — J449 Chronic obstructive pulmonary disease, unspecified: Secondary | ICD-10-CM

## 2020-09-21 MED ORDER — STIOLTO RESPIMAT 2.5-2.5 MCG/ACT IN AERS: 2.0000 | INHALATION_SPRAY | Freq: Every day | RESPIRATORY_TRACT | 0 refills | Status: DC

## 2020-09-21 MED ORDER — ALBUTEROL SULFATE HFA 108 (90 BASE) MCG/ACT IN AERS: 2.0000 | INHALATION_SPRAY | Freq: Four times a day (QID) | RESPIRATORY_TRACT | 6 refills | Status: DC | PRN

## 2020-09-21 NOTE — Progress Notes (Signed)
Subjective:    Patient ID: [name] [name], female    DOB: 1945/09/10, 75 y.o.   MRN: [id number]  HPI 75 year old off/on smoker (30 pack years) with an extensive cardiac history that includes coronary disease/CABG, ischemic cardiomyopathy, prior cardiac arrest.  Also with chronic kidney disease stage IIIb, carotid artery disease, hypertension, atrial fibrillation on anticoagulation.  She is referred today for evaluation of COPD/asthma.  Not currently on any bronchodilator therapy.  She describes comfortable breathing at rest. She does have SOB with walking, doing chores like laundry. She has to rest when shopping. She gets some SOB when she lays supine, has mucous drainage to her throat. No real cough or wheeze. She is not interested in doing / starting CPAP.   Pulmonary function testing 11/12/2014 reviewed, shows moderate obstruction with possible superimposed restriction by spirometry. Sleep study 12/13/2017 reviewed, shows severe OSA with moderate CSA and no evidence of abnormal limb movements.  AHI 47.6, CSI 21.8 CXR 08/13/20 reviewed, mild pulm edema.   Review of Systems As per HPI  Past Medical History:  Diagnosis Date  . (HFimpEF) heart failure with improved ejection fraction ([place])    a. 07/2018 Echo: EF 30-35%; b. 11/2019 Echo: EF 55-60%, no rwma, Gr2 DD, Nl RV size/fxn. Mild BAE. Mild MR/AI.  Marland Kitchen Arthritis   . Asthma   . Atopic dermatitis   . CAD (coronary artery disease)    a. 2016 s/p CABG x 2 (LIMA->LAD, VG->OM); b. 08/2018 MV: EF 44%, no ischemia/infact.  . Cardiac arrest ([place]) 10/2014  . Cardiomyopathy, ischemic    a. 07/2018 Echo: EF 30-35%; 11/2019 Echo: EF 55-60%.  . Carotid arterial disease ([place])    a. 11/2019 Carotid U/S  . CKD (chronic kidney disease), stage III ([place])   . COPD (chronic obstructive pulmonary disease) ([place])   . Esophageal dilatation 2013  . GERD (gastroesophageal reflux disease)   . Gout   . Headache   . Hypertension   . Hypokalemia   . Idiopathic  angioedema   . LGI bleed 08/06/2017   a. felt to be hemorrhoidal during that admission (no drop in Hgb).  . Lower back pain   . Paroxysmal atrial fibrillation ([place])    a. Dx 2016-->h/o difficult to control rates (complicated by noncompliance), not felt to be a candidate for ablation or antiarrhythmic due to noncompliance; b. Recurrent AF 2021 - converted w/ IV dilt; c. CHA2DS2VASc = 7-->Xarelto.  . Personal history of noncompliance with medical treatment, presenting hazards to health   . Prediabetes   . S/P CABG x 2 with clipping of LA appendage 11/14/2014   LIMA to LAD, SVG to OM, EVH via right thigh  . Urine incontinence   . Uterine fibroid      Family History  Problem Relation Age of Onset  . Cancer Mother        LYMPHOMA  . Heart disease Father   . Other Father        TB  . CVA Sister   . Prostate cancer Brother 61  . Diabetes Brother      Social History   Socioeconomic History  . Marital status: Widowed    Spouse name: Not on file  . Number of children: 1  . Years of education: 40  . Highest education level: Not on file  Occupational History  . Occupation: RETIRED [employer]  Tobacco Use  . Smoking status: Current Every Day Smoker    Packs/day: 0.50    Years: 56.00  Pack years: 28.00    Types: Cigarettes  . Smokeless tobacco: Never Used  . Tobacco comment: 1-2 cigarettes daily  Vaping Use  . Vaping Use: Never used  Substance and Sexual Activity  . Alcohol use: No    Alcohol/week: 0.0 standard drinks  . Drug use: No  . Sexual activity: Not on file  Other Topics Concern  . Not on file  Social History Narrative   Patient reports it being difficult to pay for everything due to outstanding medical bills from hospital stay last year but states she is able to keep up with basic expenses.  Patient does have some concerns with her house's condition due to a water leak- had her roof repaired last month but has some leaking in the front which has affected  her porch.  Patient owns her own car and is able to drive herself but has some concerns about the reliability of her car- has gotten taxi to the clinic for appointment in the past when her car broke down.   Social Determinants of Health   Financial Resource Strain: Not on file  Food Insecurity: Not on file  Transportation Needs: Not on file  Physical Activity: Not on file  Stress: Not on file  Social Connections: Not on file  Intimate Partner Violence: Not on file     Allergies  Allergen Reactions  . Bee Venom Anaphylaxis  . Ivp Dye [Iodinated Diagnostic Agents] Anaphylaxis  . Codeine Nausea And Vomiting  . Entresto [Sacubitril-Valsartan] Other (See Comments)    Chest pain   . Shrimp [Shellfish Allergy] Swelling  . Jardiance [Empagliflozin] Other (See Comments)    Caused Boils      Outpatient Medications Prior to Visit  Medication Sig Dispense Refill  . acetaminophen (TYLENOL) 500 MG tablet Take 1,000 mg by mouth every 6 (six) hours as needed for moderate pain.    Marland Kitchen allopurinol (ZYLOPRIM) 300 MG tablet Take 1 tablet (300 mg total) by mouth daily. 30 tablet 11  . atorvastatin (LIPITOR) 10 MG tablet Take 10 mg by mouth at bedtime.    . digoxin (LANOXIN) 0.125 MG tablet Take 0.5 tablets (0.0625 mg total) by mouth daily. 45 tablet 3  . metFORMIN (GLUCOPHAGE) 500 MG tablet Take 500 mg by mouth daily.    . metoprolol succinate (TOPROL-XL) 100 MG 24 hr tablet Take 1 tablet (100 mg total) by mouth 2 (two) times daily. 180 tablet 3  . pantoprazole (PROTONIX) 40 MG tablet Take 40 mg by mouth daily.    . rivaroxaban (XARELTO) 20 MG TABS tablet Take 1 tablet (20 mg total) by mouth daily with supper. Resume from 11/01/2019    . torsemide (DEMADEX) 20 MG tablet Take 2 tablets (40 mg total) by mouth daily. 30 tablet 6   No facility-administered medications prior to visit.         Objective:   Physical Exam Vitals:   09/21/20 1450  BP: 126/82  Pulse: 72  Temp: 97.6 F (36.4 C)   SpO2: 100%  Weight: 197 lb (89.4 kg)  Height: 5\' 7"  (1.702 m)   Gen: Pleasant, obese woman, in no distress,  normal affect  ENT: No lesions,  mouth clear,  oropharynx clear, no postnasal drip  Neck: No JVD, no stridor  Lungs: No use of accessory muscles, distant, decreased at both bases, no wheeze or crackles  Cardiovascular: RRR, heart sounds normal, no murmur or gallops, no peripheral edema  Musculoskeletal: No deformities, no cyanosis or clubbing  Neuro: alert, awake,  non focal  Skin: Warm, no lesions or rash     Assessment & Plan:  COPD (chronic obstructive pulmonary disease) ([place]) Presumed COPD based on her tobacco history, prior mixed disease on spirometry (poor study).  She may benefit from scheduled bronchodilator therapy and will do a trial of Spiriva.  I will also teach her how to use albuterol to keep available in times of need.  We will repeat her pulmonary function testing to quantify her degree of obstruction.  Obstructive sleep apnea Both OSA and CSA confirmed on her sleep study from 2019.  She is not currently on CPAP, never has been.  I discussed with her the results and offered to order for her.  She states that she does not think she would wear it but does not want to get it at this time.  Tobacco dependence Still smoking "off/on".  Discussed cessation strategies with her today.  Baltazar Apo, MD, PhD 09/21/2020, 3:24 PM [facility] [phone] or if no answer before 7:00PM call [phone] For any issues after 7:00PM please call eLink [phone]

## 2020-09-21 NOTE — Assessment & Plan Note (Signed)
Both OSA and CSA confirmed on her sleep study from 2019.  She is not currently on CPAP, never has been.  I discussed with her the results and offered to order for her.  She states that she does not think she would wear it but does not want to get it at this time.

## 2020-09-21 NOTE — Assessment & Plan Note (Signed)
Presumed COPD based on her tobacco history, prior mixed disease on spirometry (poor study).  She may benefit from scheduled bronchodilator therapy and will do a trial of Spiriva.  I will also teach her how to use albuterol to keep available in times of need.  We will repeat her pulmonary function testing to quantify her degree of obstruction.

## 2020-09-21 NOTE — Assessment & Plan Note (Signed)
Still smoking "off/on".  Discussed cessation strategies with her today.

## 2020-09-21 NOTE — Patient Instructions (Signed)
We will perform pulmonary function testing in next office visit Please start Stiolto 2 puffs once a day.  Take this every day as a maintenance medication.  Keep track of whether this helps your overall breathing.  If so then we will probably continue it going forward. We will give you a prescription for albuterol.  This is a symptom driven rescue medication.  You can use 2 puffs if you need it for shortness of breath, chest tightness, wheezing.  You do not have to take this every day on a schedule. Follow with Dr. Lamonte Sakai next available with full pulmonary function testing on the same day.

## 2020-09-23 ENCOUNTER — Ambulatory Visit: Payer: HMO | Admitting: Physician Assistant

## 2020-09-23 DIAGNOSIS — I4891 Unspecified atrial fibrillation: Secondary | ICD-10-CM | POA: Diagnosis not present

## 2020-09-23 DIAGNOSIS — I10 Essential (primary) hypertension: Secondary | ICD-10-CM | POA: Diagnosis not present

## 2020-09-23 DIAGNOSIS — E1122 Type 2 diabetes mellitus with diabetic chronic kidney disease: Secondary | ICD-10-CM | POA: Diagnosis not present

## 2020-09-23 DIAGNOSIS — I2510 Atherosclerotic heart disease of native coronary artery without angina pectoris: Secondary | ICD-10-CM | POA: Diagnosis not present

## 2020-09-23 DIAGNOSIS — M8580 Other specified disorders of bone density and structure, unspecified site: Secondary | ICD-10-CM | POA: Diagnosis not present

## 2020-09-23 DIAGNOSIS — I509 Heart failure, unspecified: Secondary | ICD-10-CM | POA: Diagnosis not present

## 2020-09-23 DIAGNOSIS — K219 Gastro-esophageal reflux disease without esophagitis: Secondary | ICD-10-CM | POA: Diagnosis not present

## 2020-09-23 DIAGNOSIS — E7800 Pure hypercholesterolemia, unspecified: Secondary | ICD-10-CM | POA: Diagnosis not present

## 2020-09-23 DIAGNOSIS — N183 Chronic kidney disease, stage 3 unspecified: Secondary | ICD-10-CM | POA: Diagnosis not present

## 2020-10-05 ENCOUNTER — Telehealth: Payer: Self-pay | Admitting: Emergency Medicine

## 2020-10-05 NOTE — Telephone Encounter (Signed)
She needs to stay off the [place]. I don't want to start another inhaler until she checks in with cardiology to insure that her A Fib is stable - most of the inhalers that we could start have tachycardia as a side effect.

## 2020-10-05 NOTE — Telephone Encounter (Signed)
Called and spoke with pt letting her know the info stated by RB and she verbalized understanding. Nothing further needed.

## 2020-10-05 NOTE — Telephone Encounter (Signed)
Called and spoke to patient.  Patient seen Dr. Lamonte Sakai on 09/21/2020 and was prescribed stiolto. She stated after the second dose of stiolto, she went into Afib.  Patient feels that she is still in Afib. She has not contact cardiology.  She last took stiolto 1 week ago. She would like an alternative.   Dr. Lamonte Sakai, please advise.

## 2020-10-10 ENCOUNTER — Telehealth: Payer: Self-pay | Admitting: Physician Assistant

## 2020-10-10 NOTE — Telephone Encounter (Signed)
Paged by answering service regarding inhaler.  I have tried to reach patient twice but no success.  No voice mail.

## 2020-10-18 NOTE — Progress Notes (Signed)
Cardiology Office Note Date:  10/18/2020  Patient ID:  [name] [name], [name] [name] [birth year], MRN [id number] PCP:  [name], MD  Cardiologist:  [name] Electrophysiologist: Dr. Lovena Le     Chief Complaint:  post hospital  History of Present Illness: [name] [name] is a 75 y.o. female with history of CAD (CABG 2016), COPD, CKD (III), ICM w/improved LVEF >> chronic diastolic CHF, HTN, HLD, persistent AFib.  She was recently referred to Dr. Lovena Le via the Afib clinic for uncontrolled AFib, he saw her 08/11/20, with escalating HF symptoms, he recommended AV node ablation/pacing, though the patient had not been taking her digoxin and wanted to try that with her high dose BB 1st.  She presented to the ER 08/13/20 with on going symptoms, still had not started the dig and remained in RVR. Started on dig and diuretics, echo done prior to her stay with EF back down. Mention of a missed xarelto dose, and Dr. Lovena Le did not feel DCCV held any long term utility.  Pt reported some degree of noted rectal bleeding as well, CBC was stable, not anemic.  She was discharged 08/15/20. Home losartan and spironolactone held 2/2 AKI to have BMET at follow up. Mild HS trop that were flat, not felt ischemic.  She saw [name] in f/u 09/16/20, rate was uncontrolled, and planned to keep EP follow up and consider AVnode ablation and PPM.no changes were made. CM suspected to be tachy-mediated  TODAY Generally feels about the same. Gets a slight ache in her epigastrium at night sometimes in bed, she thinks this is GI, takes a protonix and it goes away.  No CP when up and around. She always feels a little SOB, has some DOE though not worse then when she went home from the hospital, isn't any better either. No near syncope or syncope. No bleeding or signs of bleeding She is aware of her AFib mostly can feel it with a sense of a funny heart beat/irregular, sometimes fast in her abdomen. She does not think she is always in  Afib, felt like last night it seem to settle down and felt like she was NOT in Afib when she went to bed. She denies symptoms of orthopnea or PND, but says sometimes at night she wheezes.    Past Medical History:  Diagnosis Date  . (HFimpEF) heart failure with improved ejection fraction ([place])    a. 07/2018 Echo: EF 30-35%; b. 11/2019 Echo: EF 55-60%, no rwma, Gr2 DD, Nl RV size/fxn. Mild BAE. Mild MR/AI.  Marland Kitchen Arthritis   . Asthma   . Atopic dermatitis   . CAD (coronary artery disease)    a. 2016 s/p CABG x 2 (LIMA->LAD, VG->OM); b. 08/2018 MV: EF 44%, no ischemia/infact.  . Cardiac arrest ([place]) 10/2014  . Cardiomyopathy, ischemic    a. 07/2018 Echo: EF 30-35%; 11/2019 Echo: EF 55-60%.  . Carotid arterial disease ([place])    a. 11/2019 Carotid U/S  . CKD (chronic kidney disease), stage III ([place])   . COPD (chronic obstructive pulmonary disease) ([place])   . Esophageal dilatation 2013  . GERD (gastroesophageal reflux disease)   . Gout   . Headache   . Hypertension   . Hypokalemia   . Idiopathic angioedema   . LGI bleed 08/06/2017   a. felt to be hemorrhoidal during that admission (no drop in Hgb).  . Lower back pain   . Paroxysmal atrial fibrillation ([place])    a. Dx 2016-->h/o difficult to control rates (  complicated by noncompliance), not felt to be a candidate for ablation or antiarrhythmic due to noncompliance; b. Recurrent AF 2021 - converted w/ IV dilt; c. CHA2DS2VASc = 7-->Xarelto.  . Personal history of noncompliance with medical treatment, presenting hazards to health   . Prediabetes   . S/P CABG x 2 with clipping of LA appendage 11/14/2014   LIMA to LAD, SVG to OM, EVH via right thigh  . Urine incontinence   . Uterine fibroid     Past Surgical History:  Procedure Laterality Date  . BALLOON DILATION N/A 10/10/2019   Procedure: BALLOON DILATION;  Surgeon: Otis Brace, MD;  [facility]: [facility];  Service: Gastroenterology;  Laterality: N/A;  . BIOPSY  10/10/2019   Procedure:  BIOPSY;  Surgeon: Otis Brace, MD;  Location: [place];  Service: Gastroenterology;;  . BIOPSY  10/11/2019   Procedure: BIOPSY;  Surgeon: Otis Brace, MD;  Location: [place];  Service: Gastroenterology;;  . CARDIOVERSION N/A 11/18/2014   Procedure: CARDIOVERSION;  Surgeon: [name], MD;  Location: [place];  Service: Cardiovascular;  Laterality: N/A;  . CARDIOVERSION N/A 12/25/2017   Procedure: CARDIOVERSION;  Surgeon: [name], MD;  Location: [place];  Service: Cardiovascular;  Laterality: N/A;  . CLIPPING OF ATRIAL APPENDAGE N/A 11/14/2014   Procedure: CLIPPING OF ATRIAL APPENDAGE;  Surgeon: [name], MD;  Location: [place];  Service: Open Heart Surgery;  Laterality: N/A;  . COLONOSCOPY  2013  . COLONOSCOPY WITH PROPOFOL N/A 10/11/2019   Procedure: COLONOSCOPY WITH PROPOFOL;  Surgeon: Otis Brace, MD;  Location: [place];  Service: Gastroenterology;  Laterality: N/A;  . CORONARY ARTERY BYPASS GRAFT N/A 11/14/2014   Procedure: CORONARY ARTERY BYPASS GRAFTING (CABG)TIMES 2 USING LEFT INTERNAL MAMMARY ARTERY AND RIGHT SAPHENOUS VEIN HARVESTED ENDOSCOPICALLY;  Surgeon: [name], MD;  Location: [place];  Service: Open Heart Surgery;  Laterality: N/A;  . ESOPHAGOGASTRODUODENOSCOPY (EGD) WITH PROPOFOL N/A 10/10/2019   Procedure: ESOPHAGOGASTRODUODENOSCOPY (EGD) WITH PROPOFOL;  Surgeon: Otis Brace, MD;  [facility];  Service: Gastroenterology;  Laterality: N/A;  . ESOPHAGOGASTRODUODENOSCOPY (EGD) WITH PROPOFOL N/A 10/27/2019   Procedure: ESOPHAGOGASTRODUODENOSCOPY (EGD) WITH PROPOFOL;  Surgeon: Ronnette Juniper, MD;  Location: [place];  Service: Gastroenterology;  Laterality: N/A;  . GIVENS CAPSULE STUDY N/A 10/27/2019   Procedure: GIVENS CAPSULE STUDY;  Surgeon: Ronnette Juniper, MD;  Location: [place];  Service: Gastroenterology;  Laterality: N/A;  . LEFT HEART CATHETERIZATION WITH CORONARY ANGIOGRAM N/A 11/04/2014   Procedure: LEFT  HEART CATHETERIZATION WITH CORONARY ANGIOGRAM;  Surgeon: Troy Sine, MD;  Location: [facility] CATH LAB;  Service: Cardiovascular;  Laterality: N/A;  . POLYPECTOMY  10/11/2019   Procedure: POLYPECTOMY;  Surgeon: Otis Brace, MD;  Location: [place] ENDOSCOPY;  Service: Gastroenterology;;  . TEE WITHOUT CARDIOVERSION N/A 11/14/2014   Procedure: TRANSESOPHAGEAL ECHOCARDIOGRAM (TEE);  Surgeon: [name], MD;  Location: [place];  Service: Open Heart Surgery;  Laterality: N/A;  . TEE WITHOUT CARDIOVERSION N/A 12/25/2017   Procedure: TRANSESOPHAGEAL ECHOCARDIOGRAM (TEE);  Surgeon: [name], MD;  Location: [facility] ENDOSCOPY;  Service: Cardiovascular;  Laterality: N/A;  . TEMPORARY PACEMAKER INSERTION  11/04/2014   Procedure: TEMPORARY PACEMAKER INSERTION;  Surgeon: Troy Sine, MD;  Location: [facility] CATH LAB;  Service: Cardiovascular;;  . TOOTH EXTRACTION      Current Outpatient Medications  Medication Sig Dispense Refill  . acetaminophen (TYLENOL) 500 MG tablet Take 1,000 mg by mouth every 6 (six) hours as needed for moderate pain.    Marland Kitchen albuterol (VENTOLIN HFA)  108 (90 Base) MCG/ACT inhaler Inhale 2 puffs into the lungs every 6 (six) hours as needed for wheezing or shortness of breath. 8 g 6  . allopurinol (ZYLOPRIM) 300 MG tablet Take 1 tablet (300 mg total) by mouth daily. 30 tablet 11  . atorvastatin (LIPITOR) 10 MG tablet Take 10 mg by mouth at bedtime.    . digoxin (LANOXIN) 0.125 MG tablet Take 0.5 tablets (0.0625 mg total) by mouth daily. 45 tablet 3  . metFORMIN (GLUCOPHAGE) 500 MG tablet Take 500 mg by mouth daily.    . metoprolol succinate (TOPROL-XL) 100 MG 24 hr tablet Take 1 tablet (100 mg total) by mouth 2 (two) times daily. 180 tablet 3  . pantoprazole (PROTONIX) 40 MG tablet Take 40 mg by mouth daily.    . rivaroxaban (XARELTO) 20 MG TABS tablet Take 1 tablet (20 mg total) by mouth daily with supper. Resume from 11/01/2019    . Tiotropium Bromide-Olodaterol (STIOLTO RESPIMAT) 2.5-2.5  MCG/ACT AERS Inhale 2 puffs into the lungs daily. 4 g 0  . torsemide (DEMADEX) 20 MG tablet Take 2 tablets (40 mg total) by mouth daily. 30 tablet 6   No current facility-administered medications for this visit.    Allergies:   Bee venom, Ivp dye [iodinated diagnostic agents], Codeine, Entresto [sacubitril-valsartan], Shrimp [shellfish allergy], and Jardiance [empagliflozin]   Social History:  The patient  reports that she has been smoking cigarettes. She has a 28.00 pack-year smoking history. She has never used smokeless tobacco. She reports that she does not drink alcohol and does not use drugs.   Family History:  [name] family history includes CVA in her sister; Cancer in her mother; Diabetes in her brother; Heart disease in her father; Other in her father; Prostate cancer (age of onset: 25) in her brother.  ROS:  Please see the history of present illness.    All other systems are reviewed and otherwise negative.   PHYSICAL EXAM:  VS:  There were no vitals taken for this visit. BMI: There is no height or weight on file to calculate BMI. Well nourished, well developed, in no acute distress HEENT: normocephalic, atraumatic Neck: no JVD, carotid bruits or masses Cardiac:  irreg-irreg; no significant murmurs, no rubs, or gallops Lungs:  CTA b/l, no wheezing, I do not appreciate any rhonchi or rales, no crackles Abd: soft, nontender MS: no deformity or atrophy Ext: no edema Skin: warm and dry, no rash Neuro:  No gross deficits appreciated Psych: euthymic mood, full affect   EKG:  Done today and reviewed by myself shows  AFib 106bpm   06/27/2020: TTE IMPRESSIONS  1. Global hypokinesis with akinesis of the inferior and inferolateral  wall.  2. Left ventricular ejection fraction, by estimation, is 30 to 35%. The  left ventricle has moderate to severely decreased function. The left  ventricle demonstrates regional wall motion abnormalities (see scoring  diagram/findings  for description). There  is moderate left ventricular hypertrophy. Left ventricular diastolic  function could not be evaluated.  3. Right ventricular systolic function is normal. The right ventricular  size is normal. There is normal pulmonary artery systolic pressure.  4. Left atrial size was severely dilated.  5. Right atrial size was moderately dilated.  6. The mitral valve is normal in structure. Mild mitral valve  regurgitation. No evidence of mitral stenosis.  7. The aortic valve is tricuspid. Aortic valve regurgitation is trivial.  Mild to moderate aortic valve sclerosis/calcification is present, without  any evidence of aortic  stenosis.  8. The inferior vena cava is dilated in size with >50% respiratory  variability, suggesting right atrial pressure of 8 mmHg.    12/02/2019: TTE IMPRESSIONS  1. Left ventricular ejection fraction, by estimation, is 55 to 60%. The  left ventricle has normal function. The left ventricle demonstrates  regional wall motion abnormalities (see scoring diagram/findings for  description). There is moderate left  ventricular hypertrophy. Left ventricular diastolic parameters are  consistent with Grade II diastolic dysfunction (pseudonormalization).  Elevated left ventricular end-diastolic pressure.  2. Right ventricular systolic function is normal. The right ventricular  size is normal. Tricuspid regurgitation signal is inadequate for assessing  PA pressure.  3. Left atrial size was mildly dilated.  4. Right atrial size was mildly dilated.  5. The mitral valve is abnormal. Mild mitral valve regurgitation. No  evidence of mitral stenosis.  6. The aortic valve is abnormal. Aortic valve regurgitation is mild. Mild  aortic valve sclerosis is present, with no evidence of aortic valve  stenosis.  7. The inferior vena cava is dilated in size with >50% respiratory  variability, suggesting right atrial pressure of 8 mmHg.   LV Wall Scoring:   Hypokinesis of the lateral wall from base to mid ventricle.   10/10/19: LVEF 60-65%, no WMA   09/07/2018: stress myoview  Nuclear stress EF: 44%.  There was no ST segment deviation noted during stress.  The study is normal.  This is a low risk study.  The left ventricular ejection fraction is moderately decreased (30-44%).   08/03/2018: LVEF 30-35%, no WMA    Recent Labs: 08/13/2020: B Natriuretic Peptide 864.3; Magnesium 2.0; TSH 1.246 08/14/2020: ALT 40 08/15/2020: BUN 36; Creatinine, Ser 1.32; Hemoglobin 14.0; Platelets 156; Potassium 3.9; Sodium 142  No results found for requested labs within last 8760 hours.   CrCl cannot be calculated (Patient's most recent lab result is older than the maximum 21 days allowed.).   Wt Readings from Last 3 Encounters:  09/21/20 197 lb (89.4 kg)  09/16/20 195 lb (88.5 kg)  08/15/20 200 lb 8 oz (90.9 kg)     Other studies reviewed: Additional studies/records reviewed today include: summarized above  ASSESSMENT AND PLAN:  1. permananet Afib     CHA2DS2Vasc is 6, on Xarelto,  appropriately dosed      EKG HR today iw 106 Initial vitals was 86bpm At her pulm visit on 09/21/20 was 72  She thinks she has times of normal rhythm  Will have her wear a 1 week monitor to establish HR and see if she has any SR I discussed possibility of AVnode ablation and pacing and will have her see Dr. Lovena Le LVEF was 30-35%, narrow QRS  2. CAD     No exertional CP, night time ache occasionally is resolved with protonix     C/w [name]  3. Mixed ischemic/NICM 4. chronic CHF     LVEF is back down  ?tachy mediated HR not terrible today Her exam does not suggest volume OL, weight is stable from last month Will add back her losartan BMET in a week or so She would like to go back to Dr. Merceda Elks team  While she was waiting on her AVS paperwork, I heard her breathing change, she was seated on the exam table, her head down with chin towards  chest.  When I asked if she was OK, looked like she had drifted to sleep, O2 97-99% and HR by puls Ox 97bpm, c/w auscultation Suspect she had  fallen asleep was snoring  I asked her to revisit with pulmonary and may benefit from sleep study if not done already  5. HTN     Has room for losartan   Disposition: F/u with Dr. Julieta Gutting at their next available and Dr. Lovena Le in 3-4 weeks to review monitor and afib rate/rhythm strategy +/- pace/ablate.  Current medicines are reviewed at length with the patient today.  The patient did not have any concerns regarding medicines.  Venetia Night, PA-C 10/18/2020 10:09 AM     [facility] [address] [place] [address] [phone] (office)  [phone] (fax)

## 2020-10-19 ENCOUNTER — Ambulatory Visit (INDEPENDENT_AMBULATORY_CARE_PROVIDER_SITE_OTHER): Payer: HMO

## 2020-10-19 ENCOUNTER — Encounter: Payer: Self-pay | Admitting: *Deleted

## 2020-10-19 ENCOUNTER — Encounter: Payer: Self-pay | Admitting: Physician Assistant

## 2020-10-19 ENCOUNTER — Other Ambulatory Visit: Payer: Self-pay

## 2020-10-19 ENCOUNTER — Ambulatory Visit: Payer: HMO | Admitting: Physician Assistant

## 2020-10-19 VITALS — BP 140/80 | HR 86 | Ht 65.5 in | Wt 197.2 lb

## 2020-10-19 DIAGNOSIS — I2510 Atherosclerotic heart disease of native coronary artery without angina pectoris: Secondary | ICD-10-CM

## 2020-10-19 DIAGNOSIS — I10 Essential (primary) hypertension: Secondary | ICD-10-CM | POA: Diagnosis not present

## 2020-10-19 DIAGNOSIS — I4819 Other persistent atrial fibrillation: Secondary | ICD-10-CM | POA: Diagnosis not present

## 2020-10-19 DIAGNOSIS — I5043 Acute on chronic combined systolic (congestive) and diastolic (congestive) heart failure: Secondary | ICD-10-CM

## 2020-10-19 DIAGNOSIS — I255 Ischemic cardiomyopathy: Secondary | ICD-10-CM

## 2020-10-19 MED ORDER — LOSARTAN POTASSIUM 25 MG PO TABS: 25.0000 mg | ORAL_TABLET | Freq: Every day | ORAL | 3 refills | Status: DC

## 2020-10-19 NOTE — Patient Instructions (Addendum)
Medication Instructions:    START  TAKING LOSARTAN 25 MG ONCE A DAY   *If you need a refill on your cardiac medications before your next appointment, please call your pharmacy*   Lab Work: RETURN  BMET  7-10 DAYS   If you have labs (blood work) drawn today and your tests are completely normal, you will receive your results only by: Marland Kitchen MyChart Message (if you have MyChart) OR . A paper copy in the mail If you have any lab test that is abnormal or we need to change your treatment, we will call you to review the results.   Testing/Procedures: Your physician has recommended that you wear an event monitor. Event monitors are medical devices that record the heart's electrical activity. Doctors most often [place] these monitors to diagnose arrhythmias. Arrhythmias are problems with the speed or rhythm of the heartbeat. The monitor is a small, portable device. You can wear one while you do your normal daily activities. This is usually used to diagnose what is causing palpitations/syncope (passing out).  Follow-Up: At [facility], you and your health needs are our priority.  As part of our continuing mission to provide you with exceptional heart care, we have created designated Provider Care Teams.  These Care Teams include your primary Cardiologist (physician) and Advanced Practice Providers (APPs -  Physician Assistants and Nurse Practitioners) who all work together to provide you with the care you need, when you need it.  We recommend signing up for the patient portal called "MyChart".  Sign up information is provided on this After Visit Summary.  MyChart is used to connect with patients for Virtual Visits (Telemedicine).  Patients are able to view lab/test results, encounter notes, upcoming appointments, etc.  Non-urgent messages can be sent to your provider as well.   To learn more about what you can do with MyChart, go to [email or web address].    Your next appointment:    NEXT AVAILABLE WITH DR  Haroldine Laws OR APP   3-4 week(s)  The format for your next appointment:   In Person  Provider:   [name], MD    Other Instructions  ZIO XT- Long Term Monitor Instructions   Your physician has requested you wear your ZIO patch monitor__7_days.   This is a single patch monitor.  [employer] supplies one patch monitor per enrollment.  Additional stickers are not available.   Please do not apply patch if you will be having a Nuclear Stress Test, Echocardiogram, Cardiac CT, MRI, or Chest Xray during the time frame you would be wearing the monitor. The patch cannot be worn during these tests.  You cannot remove and re-apply the ZIO XT patch monitor.   Your ZIO patch monitor will be sent USPS Priority mail from [facility] directly to your home address. The monitor may also be mailed to a PO BOX if home delivery is not available.   It may take 3-5 days to receive your monitor after you have been enrolled.   Once you have received you monitor, please review enclosed instructions.  Your monitor has already been registered assigning a specific monitor serial # to you.   Applying the monitor   Shave hair from upper left chest.   Hold abrader disc by orange tab.  Rub abrader in 40 strokes over left upper chest as indicated in your monitor instructions.   Clean area with 4 enclosed alcohol pads .  Use all pads to assure are is cleaned thoroughly.  Let dry.  Apply patch as indicated in monitor instructions.  Patch will be place under collarbone on left side of chest with arrow pointing upward.   Rub patch adhesive wings for 2 minutes.Remove white label marked "1".  Remove white label marked "2".  Rub patch adhesive wings for 2 additional minutes.   While looking in a mirror, press and release button in center of patch.  A small green light will flash 3-4 times .  This will be your only indicator the monitor has been turned on.     Do not shower for the first 24 hours.  You may  shower after the first 24 hours.   Press button if you feel a symptom. You will hear a small click.  Record Date, Time and Symptom in the Patient Log Book.   When you are ready to remove patch, follow instructions on last 2 pages of Patient Log Book.  Stick patch monitor onto last page of Patient Log Book.   Place Patient Log Book in [place] box.  Use locking tab on box and tape box closed securely.  The [employer] and [employer] has [employer] on it.  Please place in mailbox as soon as possible.  Your physician should have your test results approximately 7 days after the monitor has been mailed back to [facility].   Call [place] at [phone] if you have questions regarding your ZIO XT patch monitor.  Call them immediately if you see an orange light blinking on your monitor.   If your monitor falls off in less than 4 days contact our Monitor department at [phone].  If your monitor becomes loose or falls off after 4 days call [employer] at [phone] for suggestions on securing your monitor.

## 2020-10-19 NOTE — Progress Notes (Signed)
Patient ID: [name] [name], female   DOB: [birth year], 75 y.o.   MRN: [id number] Patient enrolled for [employer] to ship a 7 day ZIO XT long term holter monitor to her home.

## 2020-10-22 DIAGNOSIS — I4819 Other persistent atrial fibrillation: Secondary | ICD-10-CM | POA: Diagnosis not present

## 2020-10-23 ENCOUNTER — Other Ambulatory Visit: Payer: Self-pay

## 2020-10-23 ENCOUNTER — Encounter: Payer: Self-pay | Admitting: Adult Health

## 2020-10-23 ENCOUNTER — Ambulatory Visit: Payer: HMO | Admitting: Adult Health

## 2020-10-23 DIAGNOSIS — J449 Chronic obstructive pulmonary disease, unspecified: Secondary | ICD-10-CM | POA: Diagnosis not present

## 2020-10-23 DIAGNOSIS — F17200 Nicotine dependence, unspecified, uncomplicated: Secondary | ICD-10-CM | POA: Diagnosis not present

## 2020-10-23 DIAGNOSIS — G4733 Obstructive sleep apnea (adult) (pediatric): Secondary | ICD-10-CM | POA: Diagnosis not present

## 2020-10-23 MED ORDER — SPIRIVA RESPIMAT 2.5 MCG/ACT IN AERS: 2.0000 | INHALATION_SPRAY | Freq: Every day | RESPIRATORY_TRACT | 5 refills | Status: DC

## 2020-10-23 NOTE — Assessment & Plan Note (Signed)
Previous study shows severe sleep apnea.  Patient declines further work-up and possible CPAP therapy

## 2020-10-23 NOTE — Assessment & Plan Note (Signed)
COPD.  Patient is an active smoker.  Smoking cessation discussed. I have encouraged her to return for PFTs. Feel that she would benefit for a maintenance inhaler.  We will try Spiriva  Plan  . Patient Instructions  Trial Spiriva 2 puffs daily , rinse after use.  Work on not smoking .  Activity as tolerated.  Mucinex DM Twice daily  As needed  Cough/congestion  Follow up with Dr. Lamonte Sakai  In 6 weeks with PFT and As needed   Please contact office for sooner follow up if symptoms do not improve or worsen or seek emergency care

## 2020-10-23 NOTE — Progress Notes (Signed)
@Patient  ID: [name] [name], female    DOB: 1945/10/24, 75 y.o.   MRN: [id number]  Chief Complaint  Patient presents with  . Follow-up    Referring provider: [name], MD  HPI: 75 year old female active smoker seen for pulmonary consult September 21, 2020 for COPD. Medical history significant for coronary artery disease status post CABG, ischemic cardiomyopathy previous cardiac arrest.  A. fib on anticoagulation, chronic kidney disease stage IIIb.,  Untreated sleep apnea  TEST/EVENTS :  Pulmonary function testing 11/12/2014 reviewed, shows moderate obstruction with possible superimposed restriction by spirometry. Sleep study 12/13/2017 reviewed, shows severe OSA with moderate CSA and no evidence of abnormal limb movements.  AHI 47.6, CSI 21.8 CXR 08/13/20 reviewed, mild pulm edema.   10/23/2020 Follow up : COPD Patient returns for a 1 month follow-up.  Patient was seen last visit for a pulmonary consult for COPD.  Patient is an active smoker.  We discussed smoking cessation.  Previous pulmonary function testing showed moderate obstruction and restriction.  Last visit patient was tried on [employer].  Patient says she did not tolerate this felt that it aggravated her A. fib.  Patient has been set up for pulmonary function testing but that is currently pending.  Previous chest x-ray in December showed mild pulmonary edema.  She is followed by cardiology for congestive heart failure.  And A. Fib.  Patient denies any hemoptysis, chest pain, orthopnea, increased edema.  Patient does have underlying severe obstructive sleep apnea.  She has not on CPAP.  We discussed investigating this more and possibly looking into CPAP therapy.  She declines at this time.   Allergies  Allergen Reactions  . Bee Venom Anaphylaxis  . Ivp Dye [Iodinated Diagnostic Agents] Anaphylaxis  . Codeine Nausea And Vomiting  . Entresto [Sacubitril-Valsartan] Other (See Comments)    Chest pain   . Shrimp [Shellfish  Allergy] Swelling  . Jardiance [Empagliflozin] Other (See Comments)    Caused Boils     Immunization History  Administered Date(s) Administered  . Influenza Split 07/29/2010, 05/04/2012, 07/02/2013  . Influenza, High Dose Seasonal PF 07/14/2015, 06/15/2016, 06/23/2017, 08/21/2018, 06/22/2019  . Influenza-Unspecified 07/07/2014  . PFIZER(Purple Top)SARS-COV-2 Vaccination 03/04/2020  . Pneumococcal Conjugate-13 07/07/2014  . Pneumococcal Polysaccharide-23 05/04/2012  . Td 12/20/2004, 04/13/2015    Past Medical History:  Diagnosis Date  . (HFimpEF) heart failure with improved ejection fraction ([place])    a. 07/2018 Echo: EF 30-35%; b. 11/2019 Echo: EF 55-60%, no rwma, Gr2 DD, Nl RV size/fxn. Mild BAE. Mild MR/AI.  Marland Kitchen Arthritis   . Asthma   . Atopic dermatitis   . CAD (coronary artery disease)    a. 2016 s/p CABG x 2 (LIMA->LAD, VG->OM); b. 08/2018 MV: EF 44%, no ischemia/infact.  . Cardiac arrest ([place]) 10/2014  . Cardiomyopathy, ischemic    a. 07/2018 Echo: EF 30-35%; 11/2019 Echo: EF 55-60%.  . Carotid arterial disease ([place])    a. 11/2019 Carotid U/S  . CKD (chronic kidney disease), stage III ([place])   . COPD (chronic obstructive pulmonary disease) ([place])   . Esophageal dilatation 2013  . GERD (gastroesophageal reflux disease)   . Gout   . Headache   . Hypertension   . Hypokalemia   . Idiopathic angioedema   . LGI bleed 08/06/2017   a. felt to be hemorrhoidal during that admission (no drop in Hgb).  . Lower back pain   . Paroxysmal atrial fibrillation ([place])    a. Dx 2016-->h/o difficult to control rates (complicated by noncompliance),  not felt to be a candidate for ablation or antiarrhythmic due to noncompliance; b. Recurrent AF 2021 - converted w/ IV dilt; c. CHA2DS2VASc = 7-->Xarelto.  . Personal history of noncompliance with medical treatment, presenting hazards to health   . Prediabetes   . S/P CABG x 2 with clipping of LA appendage 11/14/2014   LIMA to LAD, SVG to OM, EVH via  right thigh  . Urine incontinence   . Uterine fibroid     Tobacco History: Social History   Tobacco Use  Smoking Status Current Every Day Smoker  . Packs/day: 0.50  . Years: 56.00  . Pack years: 28.00  . Types: Cigarettes  Smokeless Tobacco Never Used  Tobacco Comment   smoking 2 cigarettes/day as of 10/23/20   Ready to quit: Not Answered Counseling given: Not Answered Comment: smoking 2 cigarettes/day as of 10/23/20   Outpatient Medications Prior to Visit  Medication Sig Dispense Refill  . acetaminophen (TYLENOL) 500 MG tablet Take 1,000 mg by mouth every 6 (six) hours as needed for moderate pain.    Marland Kitchen allopurinol (ZYLOPRIM) 300 MG tablet Take 1 tablet (300 mg total) by mouth daily. 30 tablet 11  . atorvastatin (LIPITOR) 10 MG tablet Take 10 mg by mouth at bedtime.    . digoxin (LANOXIN) 0.125 MG tablet Take 0.5 tablets (0.0625 mg total) by mouth daily. 45 tablet 3  . losartan (COZAAR) 25 MG tablet Take 1 tablet (25 mg total) by mouth daily. 90 tablet 3  . metFORMIN (GLUCOPHAGE) 500 MG tablet Take 500 mg by mouth daily.    . metoprolol succinate (TOPROL-XL) 100 MG 24 hr tablet Take 1 tablet (100 mg total) by mouth 2 (two) times daily. 180 tablet 3  . pantoprazole (PROTONIX) 40 MG tablet Take 40 mg by mouth daily.    . rivaroxaban (XARELTO) 20 MG TABS tablet Take 1 tablet (20 mg total) by mouth daily with supper. Resume from 11/01/2019    . torsemide (DEMADEX) 20 MG tablet Take 2 tablets (40 mg total) by mouth daily. 30 tablet 6  . Tiotropium Bromide-Olodaterol (STIOLTO RESPIMAT) 2.5-2.5 MCG/ACT AERS Inhale 2 puffs into the lungs daily. 4 g 0   No facility-administered medications prior to visit.     Review of Systems:   Constitutional:   No  weight loss, night sweats,  Fevers, chills,  +fatigue, or  lassitude.  HEENT:   No headaches,  Difficulty swallowing,  Tooth/dental problems, or  Sore throat,                No sneezing, itching, ear ache, nasal congestion, post nasal  drip,   CV:  No chest pain,  Orthopnea, PND, swelling in lower extremities, anasarca, dizziness, palpitations, syncope.   GI  No heartburn, indigestion, abdominal pain, nausea, vomiting, diarrhea, change in bowel habits, loss of appetite, bloody stools.   Resp:  .  No chest wall deformity  Skin: no rash or lesions.  GU: no dysuria, change in color of urine, no urgency or frequency.  No flank pain, no hematuria   MS:  No joint pain or swelling.  No decreased range of motion.  No back pain.    Physical Exam  BP 120/80 (BP Location: Left Arm, Patient Position: Sitting, Cuff Size: Normal)   Pulse (!) 101   Temp (!) 97.2 F (36.2 C) (Temporal)   Ht 5' 7.5" (1.715 m)   Wt 197 lb 6.4 oz (89.5 kg)   SpO2 100%   BMI 30.46 kg/m  GEN: A/Ox3; pleasant , NAD, elderly    HEENT:  [place]/AT,  NOSE-clear, THROAT-clear, no lesions, no postnasal drip or exudate noted.   NECK:  Supple w/ fair ROM; no JVD; normal carotid impulses w/o bruits; no thyromegaly or nodules palpated; no lymphadenopathy.    RESP  Clear  P & A; w/o, wheezes/ rales/ or rhonchi. no accessory muscle use, no dullness to percussion  CARD: irreg , no m/r/g, tr  peripheral edema, pulses intact, no cyanosis or clubbing.  GI:   Soft & nt; nml bowel sounds; no organomegaly or masses detected.   Musco: Warm bil, no deformities or joint swelling noted.   Neuro: alert, no focal deficits noted.    Skin: Warm, no lesions or rashes    Lab Results:  CBC  ProBNP No results found for: PROBNP  Imaging: No results found.    PFT Results Latest Ref Rng & Units 11/12/2014  FVC-Pre L 2.12  FVC-Predicted Pre % 76  Pre FEV1/FVC % % 72  FEV1-Pre L 1.52  FEV1-Predicted Pre % 70    No results found for: NITRICOXIDE      Assessment & Plan:   COPD (chronic obstructive pulmonary disease) (HCC) COPD.  Patient is an active smoker.  Smoking cessation discussed. I have encouraged her to return for PFTs. Feel that she would  benefit for a maintenance inhaler.  We will try Spiriva  Plan  . Patient Instructions  Trial Spiriva 2 puffs daily , rinse after use.  Work on not smoking .  Activity as tolerated.  Mucinex DM Twice daily  As needed  Cough/congestion  Follow up with Dr. Lamonte Sakai  In 6 weeks with PFT and As needed   Please contact office for sooner follow up if symptoms do not improve or worsen or seek emergency care        Obstructive sleep apnea Previous study shows severe sleep apnea.  Patient declines further work-up and possible CPAP therapy  Tobacco dependence Smoking cessation encouraged     Rexene Edison, NP 10/23/2020

## 2020-10-23 NOTE — Assessment & Plan Note (Signed)
Smoking cessation encouraged!

## 2020-10-23 NOTE — Patient Instructions (Addendum)
Trial Spiriva 2 puffs daily , rinse after use.  Work on not smoking .  Activity as tolerated.  Mucinex DM Twice daily  As needed  Cough/congestion  Follow up with Dr. Lamonte Sakai  In 6 weeks with PFT and As needed   Please contact office for sooner follow up if symptoms do not improve or worsen or seek emergency care

## 2020-10-26 ENCOUNTER — Other Ambulatory Visit: Payer: HMO

## 2020-10-26 ENCOUNTER — Other Ambulatory Visit: Payer: Self-pay

## 2020-10-26 DIAGNOSIS — I5043 Acute on chronic combined systolic (congestive) and diastolic (congestive) heart failure: Secondary | ICD-10-CM | POA: Diagnosis not present

## 2020-10-27 LAB — BASIC METABOLIC PANEL
BUN/Creatinine Ratio: 25 (ref 12–28)
BUN: 36 mg/dL — ABNORMAL HIGH (ref 8–27)
CO2: 19 mmol/L — ABNORMAL LOW (ref 20–29)
Calcium: 9.3 mg/dL (ref 8.7–10.3)
Chloride: 107 mmol/L — ABNORMAL HIGH (ref 96–106)
Creatinine, Ser: 1.46 mg/dL — ABNORMAL HIGH (ref 0.57–1.00)
Glucose: 88 mg/dL (ref 65–99)
Potassium: 3.9 mmol/L (ref 3.5–5.2)
Sodium: 143 mmol/L (ref 134–144)
eGFR: 37 mL/min/{1.73_m2} — ABNORMAL LOW (ref >59)

## 2020-11-05 DIAGNOSIS — I4819 Other persistent atrial fibrillation: Secondary | ICD-10-CM | POA: Diagnosis not present

## 2020-11-10 ENCOUNTER — Encounter (HOSPITAL_COMMUNITY): Payer: HMO

## 2020-11-12 DIAGNOSIS — N183 Chronic kidney disease, stage 3 unspecified: Secondary | ICD-10-CM | POA: Diagnosis not present

## 2020-11-12 DIAGNOSIS — I25810 Atherosclerosis of coronary artery bypass graft(s) without angina pectoris: Secondary | ICD-10-CM | POA: Diagnosis not present

## 2020-11-12 DIAGNOSIS — I4891 Unspecified atrial fibrillation: Secondary | ICD-10-CM | POA: Diagnosis not present

## 2020-11-12 DIAGNOSIS — M1990 Unspecified osteoarthritis, unspecified site: Secondary | ICD-10-CM | POA: Diagnosis not present

## 2020-11-12 DIAGNOSIS — I509 Heart failure, unspecified: Secondary | ICD-10-CM | POA: Diagnosis not present

## 2020-11-12 DIAGNOSIS — K219 Gastro-esophageal reflux disease without esophagitis: Secondary | ICD-10-CM | POA: Diagnosis not present

## 2020-11-12 DIAGNOSIS — M8580 Other specified disorders of bone density and structure, unspecified site: Secondary | ICD-10-CM | POA: Diagnosis not present

## 2020-11-12 DIAGNOSIS — E7800 Pure hypercholesterolemia, unspecified: Secondary | ICD-10-CM | POA: Diagnosis not present

## 2020-11-12 DIAGNOSIS — E1122 Type 2 diabetes mellitus with diabetic chronic kidney disease: Secondary | ICD-10-CM | POA: Diagnosis not present

## 2020-11-12 DIAGNOSIS — I10 Essential (primary) hypertension: Secondary | ICD-10-CM | POA: Diagnosis not present

## 2020-11-12 DIAGNOSIS — H269 Unspecified cataract: Secondary | ICD-10-CM | POA: Diagnosis not present

## 2020-11-12 DIAGNOSIS — J449 Chronic obstructive pulmonary disease, unspecified: Secondary | ICD-10-CM | POA: Diagnosis not present

## 2020-11-15 ENCOUNTER — Emergency Department (HOSPITAL_COMMUNITY)
Admission: EM | Admit: 2020-11-15 | Discharge: 2020-11-15 | Disposition: A | Discharge status: Home / Self Care | Payer: HMO | Attending: Emergency Medicine | Admitting: Emergency Medicine

## 2020-11-15 ENCOUNTER — Other Ambulatory Visit: Payer: Self-pay

## 2020-11-15 ENCOUNTER — Encounter (HOSPITAL_COMMUNITY): Payer: Self-pay

## 2020-11-15 ENCOUNTER — Emergency Department (HOSPITAL_COMMUNITY): Payer: HMO

## 2020-11-15 DIAGNOSIS — R0602 Shortness of breath: Secondary | ICD-10-CM | POA: Diagnosis not present

## 2020-11-15 DIAGNOSIS — I517 Cardiomegaly: Secondary | ICD-10-CM | POA: Diagnosis not present

## 2020-11-15 DIAGNOSIS — Z7984 Long term (current) use of oral hypoglycemic drugs: Secondary | ICD-10-CM | POA: Diagnosis not present

## 2020-11-15 DIAGNOSIS — F17210 Nicotine dependence, cigarettes, uncomplicated: Secondary | ICD-10-CM | POA: Insufficient documentation

## 2020-11-15 DIAGNOSIS — J9811 Atelectasis: Secondary | ICD-10-CM | POA: Diagnosis not present

## 2020-11-15 DIAGNOSIS — I4891 Unspecified atrial fibrillation: Secondary | ICD-10-CM

## 2020-11-15 DIAGNOSIS — I480 Paroxysmal atrial fibrillation: Secondary | ICD-10-CM | POA: Diagnosis not present

## 2020-11-15 DIAGNOSIS — J45909 Unspecified asthma, uncomplicated: Secondary | ICD-10-CM | POA: Diagnosis not present

## 2020-11-15 DIAGNOSIS — Z7951 Long term (current) use of inhaled steroids: Secondary | ICD-10-CM | POA: Insufficient documentation

## 2020-11-15 DIAGNOSIS — I130 Hypertensive heart and chronic kidney disease with heart failure and stage 1 through stage 4 chronic kidney disease, or unspecified chronic kidney disease: Secondary | ICD-10-CM | POA: Diagnosis not present

## 2020-11-15 DIAGNOSIS — Z79899 Other long term (current) drug therapy: Secondary | ICD-10-CM | POA: Insufficient documentation

## 2020-11-15 DIAGNOSIS — J449 Chronic obstructive pulmonary disease, unspecified: Secondary | ICD-10-CM | POA: Insufficient documentation

## 2020-11-15 DIAGNOSIS — Z7901 Long term (current) use of anticoagulants: Secondary | ICD-10-CM | POA: Diagnosis not present

## 2020-11-15 DIAGNOSIS — R0600 Dyspnea, unspecified: Secondary | ICD-10-CM | POA: Diagnosis not present

## 2020-11-15 DIAGNOSIS — Z951 Presence of aortocoronary bypass graft: Secondary | ICD-10-CM | POA: Insufficient documentation

## 2020-11-15 DIAGNOSIS — R600 Localized edema: Secondary | ICD-10-CM | POA: Diagnosis not present

## 2020-11-15 DIAGNOSIS — I5043 Acute on chronic combined systolic (congestive) and diastolic (congestive) heart failure: Secondary | ICD-10-CM | POA: Diagnosis not present

## 2020-11-15 DIAGNOSIS — R0789 Other chest pain: Secondary | ICD-10-CM | POA: Diagnosis not present

## 2020-11-15 DIAGNOSIS — I2510 Atherosclerotic heart disease of native coronary artery without angina pectoris: Secondary | ICD-10-CM | POA: Diagnosis not present

## 2020-11-15 DIAGNOSIS — N183 Chronic kidney disease, stage 3 unspecified: Secondary | ICD-10-CM | POA: Insufficient documentation

## 2020-11-15 LAB — BASIC METABOLIC PANEL
Anion gap: 11 (ref 5–15)
BUN: 36 mg/dL — ABNORMAL HIGH (ref 8–23)
CO2: 20 mmol/L — ABNORMAL LOW (ref 22–32)
Calcium: 9.3 mg/dL (ref 8.9–10.3)
Chloride: 110 mmol/L (ref 98–111)
Creatinine, Ser: 1.26 mg/dL — ABNORMAL HIGH (ref 0.44–1.00)
GFR, Estimated: 45 mL/min — ABNORMAL LOW (ref >60)
Glucose, Bld: 125 mg/dL — ABNORMAL HIGH (ref 70–99)
Potassium: 3.4 mmol/L — ABNORMAL LOW (ref 3.5–5.1)
Sodium: 141 mmol/L (ref 135–145)

## 2020-11-15 LAB — CBC WITH DIFFERENTIAL/PLATELET
Abs Immature Granulocytes: 0.04 10*3/uL (ref 0.00–0.07)
Basophils Absolute: 0.1 10*3/uL (ref 0.0–0.1)
Basophils Relative: 1 %
Eosinophils Absolute: 0 10*3/uL (ref 0.0–0.5)
Eosinophils Relative: 0 %
HCT: 46.4 % — ABNORMAL HIGH (ref 36.0–46.0)
Hemoglobin: 14.6 g/dL (ref 12.0–15.0)
Immature Granulocytes: 1 %
Lymphocytes Relative: 20 %
Lymphs Abs: 1.6 10*3/uL (ref 0.7–4.0)
MCH: 26.9 pg (ref 26.0–34.0)
MCHC: 31.5 g/dL (ref 30.0–36.0)
MCV: 85.6 fL (ref 80.0–100.0)
Monocytes Absolute: 0.6 10*3/uL (ref 0.1–1.0)
Monocytes Relative: 7 %
Neutro Abs: 5.7 10*3/uL (ref 1.7–7.7)
Neutrophils Relative %: 71 %
Platelets: 136 10*3/uL — ABNORMAL LOW (ref 150–400)
RBC: 5.42 MIL/uL — ABNORMAL HIGH (ref 3.87–5.11)
RDW: 20.5 % — ABNORMAL HIGH (ref 11.5–15.5)
WBC: 7.9 10*3/uL (ref 4.0–10.5)
nRBC: 0 % (ref 0.0–0.2)

## 2020-11-15 LAB — BRAIN NATRIURETIC PEPTIDE: B Natriuretic Peptide: 658.7 pg/mL — ABNORMAL HIGH (ref 0.0–100.0)

## 2020-11-15 LAB — MAGNESIUM: Magnesium: 2.1 mg/dL (ref 1.7–2.4)

## 2020-11-15 LAB — DIGOXIN LEVEL: Digoxin Level: 0.3 ng/mL — ABNORMAL LOW (ref 0.8–2.0)

## 2020-11-15 LAB — TROPONIN I (HIGH SENSITIVITY)
Troponin I (High Sensitivity): 23 ng/L — ABNORMAL HIGH (ref <18)
Troponin I (High Sensitivity): 21 ng/L — ABNORMAL HIGH (ref <18)

## 2020-11-15 MED ORDER — ALBUTEROL SULFATE HFA 108 (90 BASE) MCG/ACT IN AERS
2.0000 | INHALATION_SPRAY | Freq: Once | RESPIRATORY_TRACT | Status: AC
  Administered 2020-11-15: 2 via RESPIRATORY_TRACT
  Filled 2020-11-15: qty 6.7

## 2020-11-15 MED ORDER — FUROSEMIDE 10 MG/ML IJ SOLN
40.0000 mg | Freq: Once | INTRAMUSCULAR | Status: AC
  Administered 2020-11-15: 40 mg via INTRAVENOUS
  Filled 2020-11-15: qty 4

## 2020-11-15 MED ORDER — METHYLPREDNISOLONE SODIUM SUCC 125 MG IJ SOLR
125.0000 mg | Freq: Once | INTRAMUSCULAR | Status: AC
  Administered 2020-11-15: 125 mg via INTRAVENOUS
  Filled 2020-11-15: qty 2

## 2020-11-15 MED ORDER — METOPROLOL TARTRATE 25 MG PO TABS
100.0000 mg | ORAL_TABLET | Freq: Once | ORAL | Status: AC
  Administered 2020-11-15: 100 mg via ORAL
  Filled 2020-11-15: qty 4

## 2020-11-15 NOTE — ED Triage Notes (Signed)
Pt came in due to SOB and chest pressure that have worsen over the past few weeks. It correlates with the recent changes in her medications.

## 2020-11-15 NOTE — ED Notes (Signed)
Pt on cardiac monitor in hallway 22.

## 2020-11-15 NOTE — ED Provider Notes (Signed)
[facility] EMERGENCY DEPARTMENT Provider Note   CSN: [id number] Arrival date & time: 11/15/20  2542     History Chief Complaint  Patient presents with  . Chest Pain  . Shortness of Breath    [name] [name] is a 75 y.o. female with PMHx HTN, CHF with EF 30-35%, CAD s/p CABG, COPD, and PAF on Xarelto  who presents to the ED today with complaint of worsening shortness of breath x 2-3 weeks worsening last night. Pt does mention she feels like she is more congested than anything in her face and is unsure if she is having sinus issues. Pt also complains of chest tightness with feelings like she is going into A fib. Pt with long standing history of A fib with RVR. She is unsure if she has missed any doses of any of her medications recently - reports it "could be possible."  She is unsure what kind of medication she is on.  She states that every time she sees the cardiologist her medications are changed, she is unsure if her medicines were changed recently however.  Per chart review patient was seen by cardiology at the beginning of March (03/07) for follow up and started back on losartan. She also had a Xio Monitor ordered as EKG showed A fib with HR 106 to see if pt had any episodes of NSR. It does appear that cardiology is discussing need for possible AV node ablation with pt. No other changes in medications were done.  Pt denies any specific chest pain. No diaphoresis, nausea, vomiting, leg swelling.   Zio Monitor (10/19/2020) 1. Atrial fib with a slow VR, controlled VR and RVR through out the study. 2. Fequent PVC's 3. NSVT, longest 11 beats 4. No prolonged pauses  Patch Wear Time:  7 days and 0 hours (2022-03-10T11:28:57-0500 to 2022-03-17T12:46:20-0400)  6 Ventricular Tachycardia runs occurred, the run with the fastest interval lasting 11 beats with a max rate of 203 bpm (avg 164 bpm); the run with the fastest interval was also the longest. Some episodes of Ventricular  Tachycardia may be possible Atrial  Fibrillation with aberrancy. Atrial Fibrillation occurred continuously (100% burden), ranging from 60-195 bpm (avg of 110 bpm). Isolated VEs were occasional (2.7%, 29160), VE Couplets were rare (<1.0%, 245), and VE Triplets were rare (<1.0%, 15).  Ventricular Bigeminy and Trigeminy were present.   The history is provided by the patient and medical records.       Past Medical History:  Diagnosis Date  . (HFimpEF) heart failure with improved ejection fraction ([place])    a. 07/2018 Echo: EF 30-35%; b. 11/2019 Echo: EF 55-60%, no rwma, Gr2 DD, Nl RV size/fxn. Mild BAE. Mild MR/AI.  Marland Kitchen Arthritis   . Asthma   . Atopic dermatitis   . CAD (coronary artery disease)    a. 2016 s/p CABG x 2 (LIMA->LAD, VG->OM); b. 08/2018 MV: EF 44%, no ischemia/infact.  . Cardiac arrest ([place]) 10/2014  . Cardiomyopathy, ischemic    a. 07/2018 Echo: EF 30-35%; 11/2019 Echo: EF 55-60%.  . Carotid arterial disease ([place])    a. 11/2019 Carotid U/S  . CKD (chronic kidney disease), stage III ([place])   . COPD (chronic obstructive pulmonary disease) ([place])   . Esophageal dilatation 2013  . GERD (gastroesophageal reflux disease)   . Gout   . Headache   . Hypertension   . Hypokalemia   . Idiopathic angioedema   . LGI bleed 08/06/2017   a. felt to be hemorrhoidal  during that admission (no drop in Hgb).  . Lower back pain   . Paroxysmal atrial fibrillation ([place])    a. Dx 2016-->h/o difficult to control rates (complicated by noncompliance), not felt to be a candidate for ablation or antiarrhythmic due to noncompliance; b. Recurrent AF 2021 - converted w/ IV dilt; c. CHA2DS2VASc = 7-->Xarelto.  . Personal history of noncompliance with medical treatment, presenting hazards to health   . Prediabetes   . S/P CABG x 2 with clipping of LA appendage 11/14/2014   LIMA to LAD, SVG to OM, EVH via right thigh  . Urine incontinence   . Uterine fibroid     Patient Active Problem List   Diagnosis  Date Noted  . Obstructive sleep apnea 09/21/2020  . Atrial fibrillation with rapid ventricular response ([place]) 08/13/2020  . Acute lower GI bleeding 02/10/2020  . Tobacco dependence 02/10/2020  . Obesity, Class II, BMI 35-39.9 02/10/2020  . Acute upper GI bleeding 10/26/2019  . COPD (chronic obstructive pulmonary disease) ([place]) 10/26/2019  . Acute GI bleeding 10/09/2019  . Acute blood loss anemia 10/09/2019  . Postmenopausal vaginal bleeding 01/04/2018  . Snoring 01/04/2018  . Acute on chronic combined systolic and diastolic CHF (congestive heart failure) ([place])   . CHF exacerbation ([place]) 10/16/2017  . Noncompliance 10/16/2017  . Acute on chronic congestive heart failure ([place])   . Thrombocytopenia ([place]) 09/14/2017  . Acute drug-induced gout of right foot   . Medication noncompliance due to cognitive impairment 09/06/2017  . Acute diastolic CHF (congestive heart failure) ([place]) 08/06/2017  . LGI bleed, likely hemorrhoids 08/06/2017  . Paroxysmal atrial fibrillation ([place]) 02/08/2017  . Cardiomyopathy, ischemic 02/08/2017  . Dysphagia 02/08/2017  . CKD (chronic kidney disease), stage III ([place]) 01/30/2017  . Pain in shoulder 02/08/2016  . Breast pain, left 02/08/2016  . Bilateral arm numbness and tingling while sleeping 02/08/2016  . Painful lumpy left breast 09/23/2015  . [place] intertrigo 02/11/2015  . S/P CABG x 2 11/14/2014  . Accelerated hypertension   . Cardiac arrest ([place]) 11/04/2014  . Left main coronary artery disease 11/04/2014  . Coronary artery disease due to lipid rich plaque   . Acute respiratory failure with hypoxemia ([place])   . Essential hypertension   . Atrial fibrillation with RVR ([place])   . Hypokalemia 10/30/2014  . Chronic combined systolic and diastolic CHF (congestive heart failure) ([place]) 10/30/2014  . NSTEMI (non-ST elevated myocardial infarction) ([place])   . DOE (dyspnea on exertion)   . CAP (community acquired pneumonia) 10/29/2014    Past Surgical  History:  Procedure Laterality Date  . BALLOON DILATION N/A 10/10/2019   Procedure: BALLOON DILATION;  Surgeon: Otis Brace, MD;  Location: [facility];  Service: Gastroenterology;  Laterality: N/A;  . BIOPSY  10/10/2019   Procedure: BIOPSY;  Surgeon: Otis Brace, MD;  Location: [facility];  Service: Gastroenterology;;  . BIOPSY  10/11/2019   Procedure: BIOPSY;  Surgeon: Otis Brace, MD;  Location: [facility];  Service: Gastroenterology;;  . CARDIOVERSION N/A 11/18/2014   Procedure: CARDIOVERSION;  Surgeon: [name], MD;  Location: [facility];  Service: Cardiovascular;  Laterality: N/A;  . CARDIOVERSION N/A 12/25/2017   Procedure: CARDIOVERSION;  Surgeon: [name], MD;  Location: [facility];  Service: Cardiovascular;  Laterality: N/A;  . CLIPPING OF ATRIAL APPENDAGE N/A 11/14/2014   Procedure: CLIPPING OF ATRIAL APPENDAGE;  Surgeon: [name], MD;  Location: [facility];  Service: Open Heart Surgery;  Laterality: N/A;  . COLONOSCOPY  2013  .  COLONOSCOPY WITH PROPOFOL N/A 10/11/2019   Procedure: COLONOSCOPY WITH PROPOFOL;  Surgeon: Otis Brace, MD;  Location: [facility];  Service: Gastroenterology;  Laterality: N/A;  . CORONARY ARTERY BYPASS GRAFT N/A 11/14/2014   Procedure: CORONARY ARTERY BYPASS GRAFTING (CABG)TIMES 2 USING LEFT INTERNAL MAMMARY ARTERY AND RIGHT SAPHENOUS VEIN HARVESTED ENDOSCOPICALLY;  Surgeon: [name], MD;  Location: [facility];  Service: Open Heart Surgery;  Laterality: N/A;  . ESOPHAGOGASTRODUODENOSCOPY (EGD) WITH PROPOFOL N/A 10/10/2019   Procedure: ESOPHAGOGASTRODUODENOSCOPY (EGD) WITH PROPOFOL;  Surgeon: Otis Brace, MD;  [facility];  Service: Gastroenterology;  Laterality: N/A;  . ESOPHAGOGASTRODUODENOSCOPY (EGD) WITH PROPOFOL N/A 10/27/2019   Procedure: ESOPHAGOGASTRODUODENOSCOPY (EGD) WITH PROPOFOL;  Surgeon: Ronnette Juniper, MD;  Location: [facility];  Service: Gastroenterology;  Laterality: N/A;  . GIVENS CAPSULE  STUDY N/A 10/27/2019   Procedure: GIVENS CAPSULE STUDY;  Surgeon: Ronnette Juniper, MD;  Location: [facility];  Service: Gastroenterology;  Laterality: N/A;  . LEFT HEART CATHETERIZATION WITH CORONARY ANGIOGRAM N/A 11/04/2014   Procedure: LEFT HEART CATHETERIZATION WITH CORONARY ANGIOGRAM;  Surgeon: Troy Sine, MD;  Location[facility] C[facility] LAB;  Service: Cardiovascular;  Laterality: N/A;  . POLYPECTOMY  10/11/2019   Procedure: POLYPECTOMY;  Surgeon: Otis Brace, MD;  Location: [facility]OSCOPY;  Service: Gastroenterology;;  . TEE WITHOUT CARDIOVERSION N/A 11/14/2014   Procedure: TRANSESOPHAGEAL ECHOCARDIOGRAM (TEE);  Surgeon: [name], MD;  Location: [facility];  Service: Open Heart Surgery;  Laterality: N/A;  . TEE WITHOUT CARDIOVERSION N/A 12/25/2017   Procedure: TRANSESOPHAGEAL ECHOCARDIOGRAM (TEE);  Surgeon: [name], MD;  Location: [facility] ENDOSCOPY;  Service: Cardiovascular;  Laterality: N/A;  . TEMPORARY PACEMAKER INSERTION  11/04/2014   Procedure: TEMPORARY PACEMAKER INSERTION;  Surgeon: Troy Sine, MD;  Location: [facility] LAB;  Service: Cardiovascular;;  . TOOTH EXTRACTION       OB History   No obstetric history on file.     Family History  Problem Relation Age of Onset  . Cancer Mother        LYMPHOMA  . Heart disease Father   . Other Father        TB  . CVA Sister   . Prostate cancer Brother 84  . Diabetes Brother     Social History   Tobacco Use  . Smoking status: Current Every Day Smoker    Packs/day: 0.50    Years: 56.00    Pack years: 28.00    Types: Cigarettes  . Smokeless tobacco: Never Used  . Tobacco comment: smoking 2 cigarettes/day as of 10/23/20  Vaping Use  . Vaping Use: Never used  Substance Use Topics  . Alcohol use: No    Alcohol/week: 0.0 standard drinks  . Drug use: No    Home Medications Prior to Admission medications   Medication Sig Start Date End Date Taking? Authorizing Provider  acetaminophen (TYLENOL) 500 MG tablet Take 1,000 mg  by mouth every 6 (six) hours as needed for moderate pain.   Yes [provider]  allopurinol (ZYLOPRIM) 300 MG tablet Take 1 tablet (300 mg total) by mouth daily. 01/04/18  Yes Georgiana Shore, NP  atorvastatin (LIPITOR) 10 MG tablet Take 10 mg by mouth at bedtime.   Yes [provider]  digoxin (LANOXIN) 0.125 MG tablet Take 0.5 tablets (0.0625 mg total) by mouth daily. 08/11/20  Yes Evans Lance, MD  losartan (COZAAR) 25 MG tablet Take 1 tablet (25 mg total) by mouth daily. 10/19/20 01/17/21 Yes Baldwin Jamaica, PA-C  metFORMIN (  GLUCOPHAGE) 500 MG tablet Take 500 mg by mouth See admin instructions. Daily with a meal   Yes [provider]  metoprolol succinate (TOPROL-XL) 100 MG 24 hr tablet Take 1 tablet (100 mg total) by mouth 2 (two) times daily. 08/15/20  Yes Kroeger, Daleen Snook M., PA-C  pantoprazole (PROTONIX) 40 MG tablet Take 40 mg by mouth in the morning.   Yes [provider]  rivaroxaban (XARELTO) 20 MG TABS tablet Take 1 tablet (20 mg total) by mouth daily with supper. Resume from 11/01/2019 11/01/19  Yes Aline August, MD  Tiotropium Bromide Monohydrate (SPIRIVA RESPIMAT) 2.5 MCG/ACT AERS Inhale 2 puffs into the lungs daily. 10/23/20  Yes Parrett, Tammy S, NP  torsemide (DEMADEX) 20 MG tablet Take 2 tablets (40 mg total) by mouth daily. 08/16/20  Yes Kroeger, Daleen Snook M., PA-C    Allergies    Bee venom, Ivp dye [iodinated diagnostic agents], Codeine, Entresto [sacubitril-valsartan], Shrimp [shellfish allergy], and Jardiance [empagliflozin]  Review of Systems   Review of Systems  Constitutional: Negative for chills, diaphoresis and fever.  HENT: Positive for congestion.   Respiratory: Positive for chest tightness and shortness of breath. Negative for cough.   Cardiovascular: Positive for palpitations. Negative for chest pain and leg swelling.  Gastrointestinal: Negative for nausea and vomiting.  All other systems reviewed and are negative.   Physical  Exam Updated Vital Signs BP (!) 151/97 (BP Location: Left Arm)   Pulse (!) 126   Temp (!) 97.4 F (36.3 C) (Oral)   Resp (!) 24   SpO2 100%   Physical Exam Vitals and nursing note reviewed.  Constitutional:      Appearance: She is not ill-appearing or diaphoretic.  HENT:     Head: Normocephalic and atraumatic.  Eyes:     Conjunctiva/sclera: Conjunctivae normal.  Cardiovascular:     Rate and Rhythm: Tachycardia present. Rhythm irregularly irregular.     Pulses:          Radial pulses are 2+ on the right side and 2+ on the left side.       Dorsalis pedis pulses are 2+ on the right side and 2+ on the left side.     Comments: A fib with RVR with a HR 120-140s Pulmonary:     Effort: Pulmonary effort is normal.     Breath sounds: Normal breath sounds. No decreased breath sounds, wheezing, rhonchi or rales.  Abdominal:     Palpations: Abdomen is soft.     Tenderness: There is no abdominal tenderness. There is no guarding or rebound.  Musculoskeletal:     Cervical back: Neck supple.     Right lower leg: Edema present.     Left lower leg: Edema present.  Skin:    General: Skin is warm and dry.  Neurological:     Mental Status: She is alert.     ED Results / Procedures / Treatments   Labs (all labs ordered are listed, but only abnormal results are displayed) Labs Reviewed  CBC WITH DIFFERENTIAL/PLATELET - Abnormal; Notable for the following components:      Result Value   RBC 5.42 (*)    HCT 46.4 (*)    RDW 20.5 (*)    Platelets 136 (*)    All other components within normal limits  BASIC METABOLIC PANEL - Abnormal; Notable for the following components:   Potassium 3.4 (*)    CO2 20 (*)    Glucose, Bld 125 (*)    BUN 36 (*)  Creatinine, Ser 1.26 (*)    GFR, Estimated 45 (*)    All other components within normal limits  BRAIN NATRIURETIC PEPTIDE - Abnormal; Notable for the following components:   B Natriuretic Peptide 658.7 (*)    All other components within normal  limits  DIGOXIN LEVEL - Abnormal; Notable for the following components:   Digoxin Level 0.3 (*)    All other components within normal limits  TROPONIN I (HIGH SENSITIVITY) - Abnormal; Notable for the following components:   Troponin I (High Sensitivity) 23 (*)    All other components within normal limits  TROPONIN I (HIGH SENSITIVITY) - Abnormal; Notable for the following components:   Troponin I (High Sensitivity) 21 (*)    All other components within normal limits  SARS CORONAVIRUS 2 (TAT 6-24 HRS)  MAGNESIUM    EKG EKG Interpretation  Date/Time:  Sunday November 15 2020 09:31:31 EDT Ventricular Rate:  137 PR Interval:    QRS Duration: 100 QT Interval:  282 QTC Calculation: 426 R Axis:   18 Text Interpretation: recurrent Atrial fibrillation with rapid ventricular response Ventricular premature complex Repolarization abnormality, prob rate related Confirmed by Plunkett, Whitney ([id number]) on 11/15/2020 9:33:38 AM   Radiology DG Chest Port 1 View  Result Date: 11/15/2020 CLINICAL DATA:  Dyspnea and chest pressure worsening for weeks EXAM: PORTABLE CHEST 1 VIEW COMPARISON:  08/13/2020 chest radiograph. FINDINGS: Intact sternotomy wires. Stable cardiomediastinal silhouette with moderate cardiomegaly. No pneumothorax. No pleural effusion. No overt pulmonary edema. Mild platelike scarring versus atelectasis in the left mid lung. Mild right basilar atelectasis. IMPRESSION: 1. Stable moderate cardiomegaly without overt pulmonary edema. 2. Mild platelike scarring versus atelectasis in the left mid lung. 3. Mild right basilar atelectasis. Electronically Signed   By: Jason A Poff M.D.   On: 11/15/2020 10:24    Procedures Procedures   Medications Ordered in ED Medications  metoprolol tartrate (LOPRESSOR) tablet 100 mg (100 mg Oral Given 11/15/20 1134)  furosemide (LASIX) injection 40 mg (40 mg Intravenous Given 11/15/20 1437)  methylPREDNISolone sodium succinate (SOLU-MEDROL) 125 mg/2 mL injection  125 mg (125 mg Intravenous Given 11/15/20 1437)  albuterol (VENTOLIN HFA) 108 (90 Base) MCG/ACT inhaler 2 puff (2 puffs Inhalation Given 11/15/20 1437)    ED Course  I have reviewed the triage vital signs and the nursing notes.  Pertinent labs & imaging results that were available during my care of the patient were reviewed by me and considered in my medical decision making (see chart for details).    MDM Rules/Calculators/A&P                          74  year old female who presents to the ED today via personal vehicle with complaints of worsening shortness of breath x2 to 3 weeks as well as chest tightness.  She was medically screened while in triage with work-up started.  She was found to be in A. fib with RVR with a rate of 126 on arrival EKG.  History of same.  Patient is brought back to her room she states she actually feels more congested than anything else and is unsure if she is having sinus issues.  She denies any specific chest pain.  She has a history of persistent atrial fibrillation with frequently being in rapid ventricular rate.  She recently wore a ZIO monitor the beginning of March which showed that she was almost persistently tachycardic.  On arrival to the ED today patient's heart rate is 126.  She is afebrile.  Mildly tachypneic.  Appears to be no acute distress.  Satting 100% on room air.  Will work-up for shortness of breath today with EKG, chest x-ray, lab work as well as magnesium and digoxin level given patient is supposed to be on digoxin.  Will likely consult cardiology for patient.  CXR: IMPRESSION:  1. Stable moderate cardiomegaly without overt pulmonary edema.  2. Mild platelike scarring versus atelectasis in the left mid lung.  3. Mild right basilar atelectasis.   CBC without leukocytosis. Hgb stable at 14.6.  BMP with creatinine stable at 1.26 and BUN 36, baseline for patient. Potassium 3.4.  Mag 2.1.  Digoxin 0.3; pt has Rx bottle with her prescribed 03/22 for #15  tablets to be broken in half. She has total of #13 tablets in bottle.  Troponin 23. Will repeat.   BNP has returned at 658.7 which is improved from previous.   BNP (last 3 results) Recent Labs    06/26/20 1439 08/13/20 1757 11/15/20 0951  BNP 927.9* 864.3* 658.7*    ProBNP (last 3 results) No results for input(s): PROBNP in the last 8760 hours.   Repeat troponin 21.   Nursing staff informed that pt called out for an episode of worsening SOB and desatted to 85%. She was placed on 2L oxygen with improvement. Have discussed case with [name] pt's cardiologist; recommends providing 40 mg IV lasix and attempting to wean off of O2. Pt's EKG unchanged from previous and is chronically in A fib with RVR. It does appear there have been multiple conversations with patient regarding need for AV node ablation but she has been resistent to same. [name] does not recommend admission at this time if she can be weaned off O2.   Pt provided with IV lasix as well as solumedrol and albuterol inhaler. She reports improvement in her symptoms. She has been weaned off of O2 and her oxygen is at 95%+. HR has decreased into the 90s with her regular dose of metoprolol. She states she is ready to go home. Will discharge home at this time. Pt encouraged to continue taking all of her medications as prescribed and to follow up with her cardiologist regarding ED visit today. She is in agreement with plan and stable for discharge home.  This note was prepared using Dragon voice recognition software and may include unintentional dictation errors due to the inherent limitations of voice recognition software.   Final Clinical Impression(s) / ED Diagnoses Final diagnoses:  Shortness of breath  Atrial fibrillation with RVR ([place])    Rx / DC Orders ED Discharge Orders    None       Discharge Instructions     Please follow up with [name] regarding your ED visit today It is very important to continue taking  your medications as prescribed  Return to the ED for any worsening symptoms        Eustaquio Maize, PA-C 11/15/20 1559    Blanchie Dessert, MD 11/15/20 1606

## 2020-11-15 NOTE — Discharge Instructions (Addendum)
Please follow up with [name] regarding your ED visit today It is very important to continue taking your medications as prescribed  Return to the ED for any worsening symptoms

## 2020-11-15 NOTE — ED Provider Notes (Signed)
Patient placed in [facility] pathway, seen and evaluated   Chief Complaint: Shortness of breath  HPI: Patient presents with worsening shortness of breath over the past few months, had been managing at home and her pulmonologist started her on new inhaler but her symptoms have not been improving and last night she felt like she could not breathe to sleep so came in this morning.  ROS: + SOB  - CP, palpitations  Physical Exam:   Gen: No distress  Neuro: Awake and Alert  Skin: Warm    Focused Exam: Irregular HR, ranging 100-140, lungs clear, Lungs slightly diminished, no respiratory distress       Initiation of care has begun. The patient has been counseled on the process, plan, and necessity for staying for the completion/evaluation, and the remainder of the medical screening examination   MSE was initiated and I personally evaluated the patient and placed orders (if any) at  9:15 AM on November 15, 2020.  Patient noted to be in RVR with heart rates in the 130s-140s, suspect this is contributing to her shortness of breath, will be directly bedded for more immediate evaluation and treatment.   Jacqlyn Larsen, PA-C 11/15/20 2956    Lennice Sites, DO 11/15/20 [phone]

## 2020-11-18 ENCOUNTER — Telehealth (HOSPITAL_COMMUNITY): Payer: Self-pay | Admitting: Adult Health

## 2020-11-23 ENCOUNTER — Ambulatory Visit: Payer: HMO | Admitting: Cardiology

## 2020-11-26 ENCOUNTER — Ambulatory Visit: Payer: HMO | Admitting: Internal Medicine

## 2020-12-01 DIAGNOSIS — E7800 Pure hypercholesterolemia, unspecified: Secondary | ICD-10-CM | POA: Diagnosis not present

## 2020-12-01 DIAGNOSIS — J449 Chronic obstructive pulmonary disease, unspecified: Secondary | ICD-10-CM | POA: Diagnosis not present

## 2020-12-01 DIAGNOSIS — M1990 Unspecified osteoarthritis, unspecified site: Secondary | ICD-10-CM | POA: Diagnosis not present

## 2020-12-01 DIAGNOSIS — I4891 Unspecified atrial fibrillation: Secondary | ICD-10-CM | POA: Diagnosis not present

## 2020-12-01 DIAGNOSIS — I509 Heart failure, unspecified: Secondary | ICD-10-CM | POA: Diagnosis not present

## 2020-12-01 DIAGNOSIS — M8580 Other specified disorders of bone density and structure, unspecified site: Secondary | ICD-10-CM | POA: Diagnosis not present

## 2020-12-01 DIAGNOSIS — I10 Essential (primary) hypertension: Secondary | ICD-10-CM | POA: Diagnosis not present

## 2020-12-01 DIAGNOSIS — E1122 Type 2 diabetes mellitus with diabetic chronic kidney disease: Secondary | ICD-10-CM | POA: Diagnosis not present

## 2020-12-01 DIAGNOSIS — K219 Gastro-esophageal reflux disease without esophagitis: Secondary | ICD-10-CM | POA: Diagnosis not present

## 2020-12-01 DIAGNOSIS — I214 Non-ST elevation (NSTEMI) myocardial infarction: Secondary | ICD-10-CM | POA: Diagnosis not present

## 2020-12-01 DIAGNOSIS — I2510 Atherosclerotic heart disease of native coronary artery without angina pectoris: Secondary | ICD-10-CM | POA: Diagnosis not present

## 2020-12-01 DIAGNOSIS — N183 Chronic kidney disease, stage 3 unspecified: Secondary | ICD-10-CM | POA: Diagnosis not present

## 2020-12-03 DIAGNOSIS — I4891 Unspecified atrial fibrillation: Secondary | ICD-10-CM | POA: Diagnosis not present

## 2020-12-03 DIAGNOSIS — I700 Atherosclerosis of aorta: Secondary | ICD-10-CM | POA: Diagnosis not present

## 2020-12-03 DIAGNOSIS — I5020 Unspecified systolic (congestive) heart failure: Secondary | ICD-10-CM | POA: Diagnosis not present

## 2020-12-03 DIAGNOSIS — J449 Chronic obstructive pulmonary disease, unspecified: Secondary | ICD-10-CM | POA: Diagnosis not present

## 2020-12-03 DIAGNOSIS — R0609 Other forms of dyspnea: Secondary | ICD-10-CM | POA: Diagnosis not present

## 2020-12-03 DIAGNOSIS — E1122 Type 2 diabetes mellitus with diabetic chronic kidney disease: Secondary | ICD-10-CM | POA: Diagnosis not present

## 2020-12-08 ENCOUNTER — Inpatient Hospital Stay (HOSPITAL_COMMUNITY)
Admission: EM | Admit: 2020-12-08 | Discharge: 2020-12-09 | DRG: 291 | Disposition: A | Discharge status: Home / Self Care | Payer: HMO | Attending: Internal Medicine | Admitting: Internal Medicine

## 2020-12-08 ENCOUNTER — Emergency Department (HOSPITAL_COMMUNITY): Payer: HMO

## 2020-12-08 ENCOUNTER — Inpatient Hospital Stay (HOSPITAL_COMMUNITY): Payer: HMO

## 2020-12-08 DIAGNOSIS — R778 Other specified abnormalities of plasma proteins: Secondary | ICD-10-CM | POA: Diagnosis not present

## 2020-12-08 DIAGNOSIS — Z8674 Personal history of sudden cardiac arrest: Secondary | ICD-10-CM

## 2020-12-08 DIAGNOSIS — I472 Ventricular tachycardia: Secondary | ICD-10-CM | POA: Diagnosis present

## 2020-12-08 DIAGNOSIS — I4891 Unspecified atrial fibrillation: Secondary | ICD-10-CM | POA: Diagnosis not present

## 2020-12-08 DIAGNOSIS — I4819 Other persistent atrial fibrillation: Secondary | ICD-10-CM | POA: Diagnosis not present

## 2020-12-08 DIAGNOSIS — J9621 Acute and chronic respiratory failure with hypoxia: Secondary | ICD-10-CM | POA: Diagnosis not present

## 2020-12-08 DIAGNOSIS — I509 Heart failure, unspecified: Secondary | ICD-10-CM | POA: Diagnosis not present

## 2020-12-08 DIAGNOSIS — Z951 Presence of aortocoronary bypass graft: Secondary | ICD-10-CM | POA: Diagnosis not present

## 2020-12-08 DIAGNOSIS — F17210 Nicotine dependence, cigarettes, uncomplicated: Secondary | ICD-10-CM | POA: Diagnosis present

## 2020-12-08 DIAGNOSIS — I5021 Acute systolic (congestive) heart failure: Secondary | ICD-10-CM | POA: Diagnosis not present

## 2020-12-08 DIAGNOSIS — E8809 Other disorders of plasma-protein metabolism, not elsewhere classified: Secondary | ICD-10-CM | POA: Diagnosis not present

## 2020-12-08 DIAGNOSIS — I255 Ischemic cardiomyopathy: Secondary | ICD-10-CM | POA: Diagnosis present

## 2020-12-08 DIAGNOSIS — Z91013 Allergy to seafood: Secondary | ICD-10-CM

## 2020-12-08 DIAGNOSIS — I959 Hypotension, unspecified: Secondary | ICD-10-CM | POA: Diagnosis present

## 2020-12-08 DIAGNOSIS — K219 Gastro-esophageal reflux disease without esophagitis: Secondary | ICD-10-CM | POA: Diagnosis present

## 2020-12-08 DIAGNOSIS — R031 Nonspecific low blood-pressure reading: Secondary | ICD-10-CM | POA: Diagnosis present

## 2020-12-08 DIAGNOSIS — E1122 Type 2 diabetes mellitus with diabetic chronic kidney disease: Secondary | ICD-10-CM | POA: Diagnosis present

## 2020-12-08 DIAGNOSIS — R0689 Other abnormalities of breathing: Secondary | ICD-10-CM | POA: Diagnosis not present

## 2020-12-08 DIAGNOSIS — J441 Chronic obstructive pulmonary disease with (acute) exacerbation: Secondary | ICD-10-CM | POA: Diagnosis present

## 2020-12-08 DIAGNOSIS — Z823 Family history of stroke: Secondary | ICD-10-CM

## 2020-12-08 DIAGNOSIS — Z79899 Other long term (current) drug therapy: Secondary | ICD-10-CM

## 2020-12-08 DIAGNOSIS — Z7901 Long term (current) use of anticoagulants: Secondary | ICD-10-CM

## 2020-12-08 DIAGNOSIS — I493 Ventricular premature depolarization: Secondary | ICD-10-CM | POA: Diagnosis present

## 2020-12-08 DIAGNOSIS — Z888 Allergy status to other drugs, medicaments and biological substances status: Secondary | ICD-10-CM

## 2020-12-08 DIAGNOSIS — I517 Cardiomegaly: Secondary | ICD-10-CM | POA: Diagnosis not present

## 2020-12-08 DIAGNOSIS — N1832 Chronic kidney disease, stage 3b: Secondary | ICD-10-CM | POA: Diagnosis not present

## 2020-12-08 DIAGNOSIS — Z807 Family history of other malignant neoplasms of lymphoid, hematopoietic and related tissues: Secondary | ICD-10-CM

## 2020-12-08 DIAGNOSIS — D696 Thrombocytopenia, unspecified: Secondary | ICD-10-CM | POA: Diagnosis not present

## 2020-12-08 DIAGNOSIS — I2583 Coronary atherosclerosis due to lipid rich plaque: Secondary | ICD-10-CM | POA: Diagnosis not present

## 2020-12-08 DIAGNOSIS — I130 Hypertensive heart and chronic kidney disease with heart failure and stage 1 through stage 4 chronic kidney disease, or unspecified chronic kidney disease: Secondary | ICD-10-CM | POA: Diagnosis not present

## 2020-12-08 DIAGNOSIS — G4733 Obstructive sleep apnea (adult) (pediatric): Secondary | ICD-10-CM | POA: Diagnosis present

## 2020-12-08 DIAGNOSIS — R0902 Hypoxemia: Secondary | ICD-10-CM | POA: Diagnosis not present

## 2020-12-08 DIAGNOSIS — Z885 Allergy status to narcotic agent status: Secondary | ICD-10-CM | POA: Diagnosis not present

## 2020-12-08 DIAGNOSIS — R231 Pallor: Secondary | ICD-10-CM | POA: Diagnosis not present

## 2020-12-08 DIAGNOSIS — Z833 Family history of diabetes mellitus: Secondary | ICD-10-CM

## 2020-12-08 DIAGNOSIS — Z91030 Bee allergy status: Secondary | ICD-10-CM | POA: Diagnosis not present

## 2020-12-08 DIAGNOSIS — R404 Transient alteration of awareness: Secondary | ICD-10-CM | POA: Diagnosis not present

## 2020-12-08 DIAGNOSIS — M109 Gout, unspecified: Secondary | ICD-10-CM | POA: Diagnosis present

## 2020-12-08 DIAGNOSIS — R531 Weakness: Secondary | ICD-10-CM | POA: Diagnosis not present

## 2020-12-08 DIAGNOSIS — I5043 Acute on chronic combined systolic (congestive) and diastolic (congestive) heart failure: Secondary | ICD-10-CM | POA: Diagnosis not present

## 2020-12-08 DIAGNOSIS — E669 Obesity, unspecified: Secondary | ICD-10-CM | POA: Diagnosis not present

## 2020-12-08 DIAGNOSIS — I4811 Longstanding persistent atrial fibrillation: Secondary | ICD-10-CM | POA: Diagnosis present

## 2020-12-08 DIAGNOSIS — I2510 Atherosclerotic heart disease of native coronary artery without angina pectoris: Secondary | ICD-10-CM | POA: Diagnosis not present

## 2020-12-08 DIAGNOSIS — I5023 Acute on chronic systolic (congestive) heart failure: Secondary | ICD-10-CM | POA: Diagnosis not present

## 2020-12-08 DIAGNOSIS — Z8249 Family history of ischemic heart disease and other diseases of the circulatory system: Secondary | ICD-10-CM

## 2020-12-08 DIAGNOSIS — J9601 Acute respiratory failure with hypoxia: Secondary | ICD-10-CM | POA: Diagnosis not present

## 2020-12-08 DIAGNOSIS — Z6830 Body mass index (BMI) 30.0-30.9, adult: Secondary | ICD-10-CM

## 2020-12-08 DIAGNOSIS — A419 Sepsis, unspecified organism: Secondary | ICD-10-CM | POA: Diagnosis not present

## 2020-12-08 DIAGNOSIS — I428 Other cardiomyopathies: Secondary | ICD-10-CM | POA: Diagnosis not present

## 2020-12-08 DIAGNOSIS — I110 Hypertensive heart disease with heart failure: Secondary | ICD-10-CM | POA: Diagnosis not present

## 2020-12-08 DIAGNOSIS — Z91041 Radiographic dye allergy status: Secondary | ICD-10-CM | POA: Diagnosis not present

## 2020-12-08 DIAGNOSIS — Z7984 Long term (current) use of oral hypoglycemic drugs: Secondary | ICD-10-CM

## 2020-12-08 DIAGNOSIS — E785 Hyperlipidemia, unspecified: Secondary | ICD-10-CM | POA: Diagnosis present

## 2020-12-08 DIAGNOSIS — Z20822 Contact with and (suspected) exposure to covid-19: Secondary | ICD-10-CM | POA: Diagnosis not present

## 2020-12-08 DIAGNOSIS — E66811 Obesity, class 1: Secondary | ICD-10-CM | POA: Diagnosis present

## 2020-12-08 DIAGNOSIS — Z8042 Family history of malignant neoplasm of prostate: Secondary | ICD-10-CM

## 2020-12-08 DIAGNOSIS — Z9114 Patient's other noncompliance with medication regimen: Secondary | ICD-10-CM

## 2020-12-08 DIAGNOSIS — J9811 Atelectasis: Secondary | ICD-10-CM | POA: Diagnosis not present

## 2020-12-08 LAB — ECHOCARDIOGRAM COMPLETE
AR max vel: 2.17 cm2
AV Area VTI: 1.95 cm2
AV Area mean vel: 1.88 cm2
AV Mean grad: 3 mmHg
AV Peak grad: 4.9 mmHg
Ao pk vel: 1.11 m/s
Height: 67 in
MV M vel: 5.28 m/s
MV Peak grad: 111.5 mmHg
P 1/2 time: 560 msec
Radius: 0.7 cm
S' Lateral: 5.1 cm
Weight: 3121.71 oz

## 2020-12-08 LAB — URINALYSIS, ROUTINE W REFLEX MICROSCOPIC
Bacteria, UA: NONE SEEN
Bilirubin Urine: NEGATIVE
Glucose, UA: NEGATIVE mg/dL
Hgb urine dipstick: NEGATIVE
Ketones, ur: NEGATIVE mg/dL
Leukocytes,Ua: NEGATIVE
Nitrite: NEGATIVE
Protein, ur: 30 mg/dL — AB
Specific Gravity, Urine: 1.017 (ref 1.005–1.030)
pH: 5 (ref 5.0–8.0)

## 2020-12-08 LAB — CBC WITH DIFFERENTIAL/PLATELET
Abs Immature Granulocytes: 0.02 10*3/uL (ref 0.00–0.07)
Basophils Absolute: 0.1 10*3/uL (ref 0.0–0.1)
Basophils Relative: 1 %
Eosinophils Absolute: 0.1 10*3/uL (ref 0.0–0.5)
Eosinophils Relative: 1 %
HCT: 42.9 % (ref 36.0–46.0)
Hemoglobin: 12.9 g/dL (ref 12.0–15.0)
Immature Granulocytes: 0 %
Lymphocytes Relative: 15 %
Lymphs Abs: 1 10*3/uL (ref 0.7–4.0)
MCH: 26.4 pg (ref 26.0–34.0)
MCHC: 30.1 g/dL (ref 30.0–36.0)
MCV: 87.7 fL (ref 80.0–100.0)
Monocytes Absolute: 0.5 10*3/uL (ref 0.1–1.0)
Monocytes Relative: 7 %
Neutro Abs: 5.2 10*3/uL (ref 1.7–7.7)
Neutrophils Relative %: 76 %
Platelets: 127 10*3/uL — ABNORMAL LOW (ref 150–400)
RBC: 4.89 MIL/uL (ref 3.87–5.11)
RDW: 20.8 % — ABNORMAL HIGH (ref 11.5–15.5)
WBC: 6.9 10*3/uL (ref 4.0–10.5)
nRBC: 0 % (ref 0.0–0.2)

## 2020-12-08 LAB — BLOOD GAS, ARTERIAL
Acid-base deficit: 2.3 mmol/L — ABNORMAL HIGH (ref 0.0–2.0)
Bicarbonate: 21.2 mmol/L (ref 20.0–28.0)
Drawn by: 519031
FIO2: 32
O2 Saturation: 95.6 %
Patient temperature: 36.4
pCO2 arterial: 31 mmHg — ABNORMAL LOW (ref 32.0–48.0)
pH, Arterial: 7.447 (ref 7.350–7.450)
pO2, Arterial: 78.6 mmHg — ABNORMAL LOW (ref 83.0–108.0)

## 2020-12-08 LAB — DIGOXIN LEVEL: Digoxin Level: 0.6 ng/mL — ABNORMAL LOW (ref 0.8–2.0)

## 2020-12-08 LAB — TROPONIN I (HIGH SENSITIVITY)
Troponin I (High Sensitivity): 20 ng/L — ABNORMAL HIGH (ref <18)
Troponin I (High Sensitivity): 25 ng/L — ABNORMAL HIGH (ref <18)

## 2020-12-08 LAB — BLOOD GAS, VENOUS
Acid-Base Excess: 0.4 mmol/L (ref 0.0–2.0)
Bicarbonate: 25.1 mmol/L (ref 20.0–28.0)
Drawn by: 5760
FIO2: 32
O2 Saturation: 42 %
Patient temperature: 36.4
pCO2, Ven: 43.8 mmHg — ABNORMAL LOW (ref 44.0–60.0)
pH, Ven: 7.373 (ref 7.250–7.430)
pO2, Ven: <32 mmHg — CL (ref 32.0–45.0)

## 2020-12-08 LAB — COMPREHENSIVE METABOLIC PANEL
ALT: 14 U/L (ref 0–44)
AST: 25 U/L (ref 15–41)
Albumin: 2.7 g/dL — ABNORMAL LOW (ref 3.5–5.0)
Alkaline Phosphatase: 120 U/L (ref 38–126)
Anion gap: 12 (ref 5–15)
BUN: 31 mg/dL — ABNORMAL HIGH (ref 8–23)
CO2: 19 mmol/L — ABNORMAL LOW (ref 22–32)
Calcium: 8.8 mg/dL — ABNORMAL LOW (ref 8.9–10.3)
Chloride: 110 mmol/L (ref 98–111)
Creatinine, Ser: 1.47 mg/dL — ABNORMAL HIGH (ref 0.44–1.00)
GFR, Estimated: 37 mL/min — ABNORMAL LOW (ref >60)
Glucose, Bld: 126 mg/dL — ABNORMAL HIGH (ref 70–99)
Potassium: 4.4 mmol/L (ref 3.5–5.1)
Sodium: 141 mmol/L (ref 135–145)
Total Bilirubin: 2 mg/dL — ABNORMAL HIGH (ref 0.3–1.2)
Total Protein: 5.5 g/dL — ABNORMAL LOW (ref 6.5–8.1)

## 2020-12-08 LAB — RAPID URINE DRUG SCREEN, HOSP PERFORMED
Amphetamines: NOT DETECTED
Barbiturates: NOT DETECTED
Benzodiazepines: NOT DETECTED
Cocaine: NOT DETECTED
Opiates: NOT DETECTED
Tetrahydrocannabinol: NOT DETECTED

## 2020-12-08 LAB — RESP PANEL BY RT-PCR (FLU A&B, COVID) ARPGX2
Influenza A by PCR: NEGATIVE
Influenza B by PCR: NEGATIVE
SARS Coronavirus 2 by RT PCR: NEGATIVE

## 2020-12-08 LAB — APTT: aPTT: 37 seconds — ABNORMAL HIGH (ref 24–36)

## 2020-12-08 LAB — GLUCOSE, CAPILLARY
Glucose-Capillary: 173 mg/dL — ABNORMAL HIGH (ref 70–99)
Glucose-Capillary: 329 mg/dL — ABNORMAL HIGH (ref 70–99)

## 2020-12-08 LAB — AMMONIA: Ammonia: 42 umol/L — ABNORMAL HIGH (ref 9–35)

## 2020-12-08 LAB — HEMOGLOBIN A1C
Hgb A1c MFr Bld: 7 % — ABNORMAL HIGH (ref 4.8–5.6)
Mean Plasma Glucose: 154.2 mg/dL

## 2020-12-08 LAB — CBG MONITORING, ED: Glucose-Capillary: 127 mg/dL — ABNORMAL HIGH (ref 70–99)

## 2020-12-08 LAB — BRAIN NATRIURETIC PEPTIDE: B Natriuretic Peptide: 1065.4 pg/mL — ABNORMAL HIGH (ref 0.0–100.0)

## 2020-12-08 LAB — MAGNESIUM: Magnesium: 2 mg/dL (ref 1.7–2.4)

## 2020-12-08 LAB — PROTIME-INR
INR: 1.6 — ABNORMAL HIGH (ref 0.8–1.2)
Prothrombin Time: 18.6 seconds — ABNORMAL HIGH (ref 11.4–15.2)

## 2020-12-08 LAB — LACTIC ACID, PLASMA: Lactic Acid, Venous: 1.8 mmol/L (ref 0.5–1.9)

## 2020-12-08 MED ORDER — ONDANSETRON HCL 4 MG/2ML IJ SOLN: 4.0000 mg | Freq: Four times a day (QID) | INTRAMUSCULAR | Status: DC | PRN

## 2020-12-08 MED ORDER — ATORVASTATIN CALCIUM 10 MG PO TABS
10.0000 mg | ORAL_TABLET | Freq: Every day | ORAL | Status: DC
  Administered 2020-12-08 – 2020-12-09 (×2): 10 mg via ORAL
  Filled 2020-12-08 (×2): qty 1

## 2020-12-08 MED ORDER — TORSEMIDE 20 MG PO TABS
40.0000 mg | ORAL_TABLET | Freq: Every day | ORAL | Status: DC
  Administered 2020-12-08 – 2020-12-09 (×2): 40 mg via ORAL
  Filled 2020-12-08 (×2): qty 2

## 2020-12-08 MED ORDER — ALLOPURINOL 300 MG PO TABS
300.0000 mg | ORAL_TABLET | Freq: Every day | ORAL | Status: DC
  Administered 2020-12-08 – 2020-12-09 (×2): 300 mg via ORAL
  Filled 2020-12-08: qty 1

## 2020-12-08 MED ORDER — UMECLIDINIUM BROMIDE 62.5 MCG/INH IN AEPB
1.0000 | INHALATION_SPRAY | Freq: Every day | RESPIRATORY_TRACT | Status: DC
  Administered 2020-12-09: 1 via RESPIRATORY_TRACT
  Filled 2020-12-08: qty 7

## 2020-12-08 MED ORDER — PANTOPRAZOLE SODIUM 40 MG PO TBEC
40.0000 mg | DELAYED_RELEASE_TABLET | Freq: Every morning | ORAL | Status: DC
  Administered 2020-12-08 – 2020-12-09 (×2): 40 mg via ORAL
  Filled 2020-12-08: qty 1

## 2020-12-08 MED ORDER — RIVAROXABAN 20 MG PO TABS
20.0000 mg | ORAL_TABLET | Freq: Every day | ORAL | Status: DC
  Administered 2020-12-08 – 2020-12-09 (×2): 20 mg via ORAL
  Filled 2020-12-08 (×2): qty 1

## 2020-12-08 MED ORDER — SODIUM CHLORIDE 0.9% FLUSH
3.0000 mL | Freq: Two times a day (BID) | INTRAVENOUS | Status: DC
  Administered 2020-12-08 – 2020-12-09 (×3): 3 mL via INTRAVENOUS

## 2020-12-08 MED ORDER — FUROSEMIDE 10 MG/ML IJ SOLN
60.0000 mg | Freq: Once | INTRAMUSCULAR | Status: AC
  Administered 2020-12-08: 60 mg via INTRAVENOUS
  Filled 2020-12-08: qty 6

## 2020-12-08 MED ORDER — INSULIN ASPART 100 UNIT/ML ~~LOC~~ SOLN
0.0000 [IU] | Freq: Three times a day (TID) | SUBCUTANEOUS | Status: DC
  Administered 2020-12-08: 2 [IU] via SUBCUTANEOUS
  Administered 2020-12-09: 1 [IU] via SUBCUTANEOUS
  Administered 2020-12-09 (×2): 2 [IU] via SUBCUTANEOUS

## 2020-12-08 MED ORDER — METOPROLOL SUCCINATE ER 100 MG PO TB24
100.0000 mg | ORAL_TABLET | Freq: Two times a day (BID) | ORAL | Status: DC
  Administered 2020-12-08 – 2020-12-09 (×4): 100 mg via ORAL
  Filled 2020-12-08 (×3): qty 1

## 2020-12-08 MED ORDER — SODIUM CHLORIDE 0.9 % IV SOLN
250.0000 mL | INTRAVENOUS | Status: DC | PRN
Start: 2020-12-08 — End: 2020-12-10

## 2020-12-08 MED ORDER — FUROSEMIDE 10 MG/ML IJ SOLN: 40.0000 mg | Freq: Two times a day (BID) | INTRAMUSCULAR | Status: DC

## 2020-12-08 MED ORDER — ALBUTEROL SULFATE HFA 108 (90 BASE) MCG/ACT IN AERS
2.0000 | INHALATION_SPRAY | Freq: Once | RESPIRATORY_TRACT | Status: AC
  Administered 2020-12-08: 2 via RESPIRATORY_TRACT
  Filled 2020-12-08: qty 6.7

## 2020-12-08 MED ORDER — PREDNISONE 20 MG PO TABS
40.0000 mg | ORAL_TABLET | Freq: Every day | ORAL | Status: DC
  Administered 2020-12-09: 40 mg via ORAL
  Filled 2020-12-08: qty 2

## 2020-12-08 MED ORDER — ALBUTEROL SULFATE (2.5 MG/3ML) 0.083% IN NEBU
2.5000 mg | INHALATION_SOLUTION | Freq: Four times a day (QID) | RESPIRATORY_TRACT | Status: DC | PRN
  Administered 2020-12-08: 2.5 mg via RESPIRATORY_TRACT
  Filled 2020-12-08: qty 3

## 2020-12-08 MED ORDER — METHYLPREDNISOLONE SODIUM SUCC 125 MG IJ SOLR
125.0000 mg | Freq: Once | INTRAMUSCULAR | Status: AC
  Administered 2020-12-08: 125 mg via INTRAVENOUS
  Filled 2020-12-08: qty 2

## 2020-12-08 MED ORDER — ACETAMINOPHEN 325 MG PO TABS: 650.0000 mg | ORAL_TABLET | ORAL | Status: DC | PRN

## 2020-12-08 MED ORDER — DIGOXIN 125 MCG PO TABS
0.0625 mg | ORAL_TABLET | Freq: Every day | ORAL | Status: DC
  Administered 2020-12-08 – 2020-12-09 (×2): 0.0625 mg via ORAL
  Filled 2020-12-08: qty 1

## 2020-12-08 MED ORDER — SODIUM CHLORIDE 0.9% FLUSH: 3.0000 mL | INTRAVENOUS | Status: DC | PRN

## 2020-12-08 MED ORDER — TIOTROPIUM BROMIDE MONOHYDRATE 2.5 MCG/ACT IN AERS: 2.0000 | INHALATION_SPRAY | Freq: Every day | RESPIRATORY_TRACT | Status: DC

## 2020-12-08 MED ORDER — SODIUM CHLORIDE 0.9 % IV BOLUS
500.0000 mL | Freq: Once | INTRAVENOUS | Status: AC
  Administered 2020-12-08: 500 mL via INTRAVENOUS

## 2020-12-08 MED ORDER — LOSARTAN POTASSIUM 25 MG PO TABS
25.0000 mg | ORAL_TABLET | Freq: Every day | ORAL | Status: DC
  Administered 2020-12-09: 25 mg via ORAL
  Filled 2020-12-08: qty 1

## 2020-12-08 NOTE — Consult Note (Addendum)
Cardiology Consultation:   Patient ID: [name] [name] MRN: [id number]; DOB: 11/04/1945  Admit date: 12/08/2020 Date of Consult: 12/08/2020  PCP:  [name], [place]  Cardiologist:  [name], MD   Patient Profile:   [name] [name] is a 75 y.o. female with a hx of CAD s/p CABG, CHF, HTN, HLD, PAF with noncompliance, DM2, COPD, GERD, COPD, CKD stage III who is being seen today for the evaluation of Afib and CHF at the request of Dr. Tamala Julian.  History of Present Illness:   [name] [name] CABG x 2 (2016, LIMA-LAD, SVG-OM), nonischemic nuclear stress test Jan 2020. Chronic systolic and diastolic heart failure with EF 30-35% which improved to 55-60% in 11/2019.  Felt to be tachycardia mediated. Has seen [name] in the past.     She has a history of noncompliance. She has been followed in Afib clinic. She was seen in the ER 08/13/20 for symptomatic Afib. She had been seen the week prior by Dr. Lovena Le and Roderic Palau who recommended starting digoxin and consideration of AV node ablation and PPM placement. She did nto start digoxin and instead presented to ER. She also reported rectal bleeding. Of note, she was on amiodarone in 2019, but D/C'ed due to COPD. Not a tikosyn candidate due to concern for adherence. She has a history of bradycardia when in sinus rhythm. During her hospitalization, digoxin was started. Metoprolol succinate 100 mg BID continued. Xarelto continued for stroke PPX. Echo 06/2020 with EF 30-35%. She was diuresed and discharged on 40 mg torsemide daily. Home losartan and spiro held for BP and renal function. At OP follow up her HR was not controlled and [name] encouraged her to consider ablation and PPM, but she refused. She saw Tommye Standard in clinic 10/19/20 who placed a 1 week monitor. Pt stated she felt she had periods of sinus rhythm, however, heart monitor showed Afib with both slow-normal and RVR, NSVT, and frequent PVCs. She was in Afib  100% of the time. Pt saw pulmonary 10/23/20 - she has OSA but refuses CPAP. Started on stiolto, but she didn't tolerate this. Pulmonology prefers stable Afib RVR prior to starting a new inhaler since many can cause tachycardia as a side effect.   She [facility]20 with worsening shortness of breath over the past few months not improved with her new inhaler. She was diuresed and given breathing treatments. Discharged without admission.   She presented back to [facility] today 12/08/20 with generalized weakness, nausea, and vomiting x 1. She was hypotensive on EMS arrival but responded to 300 cc IVF. She reports that someone called and told her to double her torsemide, but that resulted in weakness. Unclear who instructed this. On arrival, she was hypoxic in the 70-80s requiring [place] O2. She was given an additional 500 cc fluid initially, but then felt to be volume up and given 60 mg IV lasix. Afib with rates in the 80-110s. She was lethargic  Digoxin level - 0.6 BNP 1100 sCr 1.47 with K 4.4 Albumin 2.7 HS troponin 20 --> 25 Lactic acid 1.8  Cardiology was consulted. During my interview, she was very lethargic and drowsy. She has been drinking a lot of fluids at home. Unclear if she has maintained a low sodium diet. Overall, history was difficult to gather. She denies chest pain. She states her breathing has improved somewhat, but still requiring supplemental oxygen. She does not recall why her friend [name] [name] called  EMS. She may have missed some doses of xarelto, she isn't sure.    Past Medical History:  Diagnosis Date  . (HFimpEF) heart failure with improved ejection fraction ([place])    a. 07/2018 Echo: EF 30-35%; b. 11/2019 Echo: EF 55-60%, no rwma, Gr2 DD, Nl RV size/fxn. Mild BAE. Mild MR/AI.  Marland Kitchen Arthritis   . Asthma   . Atopic dermatitis   . CAD (coronary artery disease)    a. 2016 s/p CABG x 2 (LIMA->LAD, VG->OM); b. 08/2018 MV: EF 44%, no ischemia/infact.  . Cardiac arrest ([place]) 10/2014  .  Cardiomyopathy, ischemic    a. 07/2018 Echo: EF 30-35%; 11/2019 Echo: EF 55-60%.  . Carotid arterial disease ([place])    a. 11/2019 Carotid U/S  . CKD (chronic kidney disease), stage III ([place])   . COPD (chronic obstructive pulmonary disease) ([place])   . Esophageal dilatation 2013  . GERD (gastroesophageal reflux disease)   . Gout   . Headache   . Hypertension   . Hypokalemia   . Idiopathic angioedema   . LGI bleed 08/06/2017   a. felt to be hemorrhoidal during that admission (no drop in Hgb).  . Lower back pain   . Paroxysmal atrial fibrillation ([place])    a. Dx 2016-->h/o difficult to control rates (complicated by noncompliance), not felt to be a candidate for ablation or antiarrhythmic due to noncompliance; b. Recurrent AF 2021 - converted w/ IV dilt; c. CHA2DS2VASc = 7-->Xarelto.  . Personal history of noncompliance with medical treatment, presenting hazards to health   . Prediabetes   . S/P CABG x 2 with clipping of LA appendage 11/14/2014   LIMA to LAD, SVG to OM, EVH via right thigh  . Urine incontinence   . Uterine fibroid     Past Surgical History:  Procedure Laterality Date  . BALLOON DILATION N/A 10/10/2019   Procedure: BALLOON DILATION;  Surgeon: Otis Brace, MD;  [facility]: [facility];  Service: Gastroenterology;  Laterality: N/A;  . BIOPSY  10/10/2019   Procedure: BIOPSY;  Surgeon: Otis Brace, MD;  Location: [place];  Service: Gastroenterology;;  . BIOPSY  10/11/2019   Procedure: BIOPSY;  Surgeon: Otis Brace, MD;  Location: [place];  Service: Gastroenterology;;  . CARDIOVERSION N/A 11/18/2014   Procedure: CARDIOVERSION;  Surgeon: [name], MD;  Location: [place];  Service: Cardiovascular;  Laterality: N/A;  . CARDIOVERSION N/A 12/25/2017   Procedure: CARDIOVERSION;  Surgeon: [name], MD;  Location: [place];  Service: Cardiovascular;  Laterality: N/A;  . CLIPPING OF ATRIAL APPENDAGE N/A 11/14/2014   Procedure: CLIPPING OF ATRIAL  APPENDAGE;  Surgeon: [name], MD;  Location: [place];  Service: Open Heart Surgery;  Laterality: N/A;  . COLONOSCOPY  2013  . COLONOSCOPY WITH PROPOFOL N/A 10/11/2019   Procedure: COLONOSCOPY WITH PROPOFOL;  Surgeon: Otis Brace, MD;  Location: [place];  Service: Gastroenterology;  Laterality: N/A;  . CORONARY ARTERY BYPASS GRAFT N/A 11/14/2014   Procedure: CORONARY ARTERY BYPASS GRAFTING (CABG)TIMES 2 USING LEFT INTERNAL MAMMARY ARTERY AND RIGHT SAPHENOUS VEIN HARVESTED ENDOSCOPICALLY;  Surgeon: [name], MD;  Location: [place];  Service: Open Heart Surgery;  Laterality: N/A;  . ESOPHAGOGASTRODUODENOSCOPY (EGD) WITH PROPOFOL N/A 10/10/2019   Procedure: ESOPHAGOGASTRODUODENOSCOPY (EGD) WITH PROPOFOL;  Surgeon: Otis Brace, MD;  [facility];  Service: Gastroenterology;  Laterality: N/A;  . ESOPHAGOGASTRODUODENOSCOPY (EGD) WITH PROPOFOL N/A 10/27/2019   Procedure: ESOPHAGOGASTRODUODENOSCOPY (EGD) WITH PROPOFOL;  Surgeon: Ronnette Juniper, MD;  Location: [place];  Service: Gastroenterology;  Laterality: N/A;  . GIVENS CAPSULE STUDY N/A 10/27/2019   Procedure: GIVENS CAPSULE STUDY;  Surgeon: Kerin Salen, MD;  Location[facility] ENDOSCOPY;  Service: Gastroenterology;  Laterality: N/A;  . LEFT HEART CATHETERIZATION WITH CORONARY ANGIOGRAM N/A 11/04/2014   Procedure: LEFT HEART CATHETERIZATION WITH CORONARY ANGIOGRAM;  Surgeon: Lennette Bihari, MD;  Location: [facility] LAB;  Service: Cardiovascular;  Laterality: N/A;  . POLYPECTOMY  10/11/2019   Procedure: POLYPECTOMY;  Surgeon: Kathi Der, MD;  [facility];  Service: Gastroenterology;;  . TEE WITHOUT CARDIOVERSION N/A 11/14/2014   Procedure: TRANSESOPHAGEAL ECHOCARDIOGRAM (TEE);  Surgeon: Purcell Nails, MD;  Location: [facility] OR;  Service: Open Heart Surgery;  Laterality: N/A;  . TEE WITHOUT CARDIOVERSION N/A 12/25/2017   Procedure: TRANSESOPHAGEAL ECHOCARDIOGRAM (TEE);  Surgeon: Dolores Patty, MD;  Location: [facility]  ENDOSCOPY;  Service: Cardiovascular;  Laterality: N/A;  . TEMPORARY PACEMAKER INSERTION  11/04/2014   Procedure: TEMPORARY PACEMAKER INSERTION;  Surgeon: Lennette Bihari, MD;  Location: [facility] CATH LAB;  Service: Cardiovascular;;  . TOOTH EXTRACTION       Home Medications:  Prior to Admission medications   Medication Sig Start Date End Date Taking? Authorizing Provider  albuterol (VENTOLIN HFA) 108 (90 Base) MCG/ACT inhaler Inhale 1-2 puffs into the lungs every 6 (six) hours as needed for wheezing or shortness of breath.   Yes [provider]  furosemide (LASIX) 20 MG tablet Take 20 mg by mouth 2 (two) times daily.   Yes [provider]  spironolactone (ALDACTONE) 25 MG tablet Take 25 mg by mouth daily.   Yes [provider]  tiZANidine (ZANAFLEX) 2 MG tablet Take 2 mg by mouth at bedtime.   Yes [provider]  acetaminophen (TYLENOL) 500 MG tablet Take 1,000 mg by mouth every 6 (six) hours as needed for moderate pain.    [provider]  allopurinol (ZYLOPRIM) 300 MG tablet Take 1 tablet (300 mg total) by mouth daily. 01/04/18   Alford Highland, NP  atorvastatin (LIPITOR) 10 MG tablet Take 10 mg by mouth at bedtime.    [provider]  digoxin (LANOXIN) 0.125 MG tablet Take 0.5 tablets (0.0625 mg total) by mouth daily. 08/11/20   [name], MD  losartan (COZAAR) 25 MG tablet Take 1 tablet (25 mg total) by mouth daily. 10/19/20 01/17/21  Sheilah Pigeon, PA-C  metFORMIN (GLUCOPHAGE) 500 MG tablet Take 500 mg by mouth See admin instructions. Daily with a meal    [provider]  metoprolol succinate (TOPROL-XL) 100 MG 24 hr tablet Take 1 tablet (100 mg total) by mouth 2 (two) times daily. 08/15/20   Kroeger, Ovidio Kin., PA-C  pantoprazole (PROTONIX) 40 MG tablet Take 40 mg by mouth in the morning.    [provider]  rivaroxaban (XARELTO) 20 MG TABS tablet Take 1 tablet (20 mg total) by mouth daily with supper. Resume from  11/01/2019 11/01/19   Glade Lloyd, MD  Tiotropium Bromide Monohydrate (SPIRIVA RESPIMAT) 2.5 MCG/ACT AERS Inhale 2 puffs into the lungs daily. 10/23/20   Parrett, Virgel Bouquet, NP  torsemide (DEMADEX) 20 MG tablet Take 2 tablets (40 mg total) by mouth daily. Patient not taking: Reported on 12/08/2020 08/16/20   Beatriz Stallion., PA-C    Inpatient Medications: Scheduled Meds: . allopurinol  300 mg Oral Daily  . atorvastatin  10 mg Oral QHS  . digoxin  0.0625 mg Oral Daily  . furosemide  40 mg Intravenous BID  . insulin aspart  0-9  Units Subcutaneous TID WC  . losartan  25 mg Oral Daily  . metoprolol succinate  100 mg Oral BID  . pantoprazole  40 mg Oral q AM  . [START ON 12/09/2020] predniSONE  40 mg Oral Q breakfast  . rivaroxaban  20 mg Oral Q supper  . sodium chloride flush  3 mL Intravenous Q12H  . umeclidinium bromide  1 puff Inhalation Daily   Continuous Infusions: . sodium chloride     PRN Meds: sodium chloride, acetaminophen, albuterol, ondansetron (ZOFRAN) IV, sodium chloride flush  Allergies:    Allergies  Allergen Reactions  . Bee Venom Anaphylaxis  . Ivp Dye [Iodinated Diagnostic Agents] Anaphylaxis  . Codeine Nausea And Vomiting  . Entresto [Sacubitril-Valsartan] Other (See Comments)    Chest pain   . Shrimp [Shellfish Allergy] Swelling  . Jardiance [Empagliflozin] Other (See Comments)    Caused Boils     Social History:   Social History   Socioeconomic History  . Marital status: Widowed    Spouse name: Not on file  . Number of children: 1  . Years of education: 49  . Highest education level: Not on file  Occupational History  . Occupation: RETIRED [employer]  Tobacco Use  . Smoking status: Current Every Day Smoker    Packs/day: 0.50    Years: 56.00    Pack years: 28.00    Types: Cigarettes  . Smokeless tobacco: Never Used  . Tobacco comment: smoking 2 cigarettes/day as of 10/23/20  Vaping Use  . Vaping Use: Never used  Substance and Sexual  Activity  . Alcohol use: No    Alcohol/week: 0.0 standard drinks  . Drug use: No  . Sexual activity: Not on file  Other Topics Concern  . Not on file  Social History Narrative   Patient reports it being difficult to pay for everything due to outstanding medical bills from hospital stay last year but states she is able to keep up with basic expenses.  Patient does have some concerns with her house's condition due to a water leak- had her roof repaired last month but has some leaking in the front which has affected her porch.  Patient owns her own car and is able to drive herself but has some concerns about the reliability of her car- has gotten taxi to the clinic for appointment in the past when her car broke down.   Social Determinants of Health   Financial Resource Strain: Not on file  Food Insecurity: Not on file  Transportation Needs: Not on file  Physical Activity: Not on file  Stress: Not on file  Social Connections: Not on file  Intimate Partner Violence: Not on file    Family History:    Family History  Problem Relation Age of Onset  . Cancer Mother        LYMPHOMA  . Heart disease Father   . Other Father        TB  . CVA Sister   . Prostate cancer Brother 36  . Diabetes Brother      ROS:  Please see the history of present illness.   All other ROS reviewed and negative.     Physical Exam/Data:   Vitals:   12/08/20 1045 12/08/20 1100 12/08/20 1215 12/08/20 1417  BP: (!) 136/94 (!) 138/91 118/85 111/72  Pulse: 77 80 95 91  Resp: (!) 33 (!) 37 16   Temp:   (!) 97.4 F (36.3 C)   TempSrc:  Oral   SpO2: 94% 99% 100% 100%  Weight:   88.5 kg   Height:   5\' 7"  (1.702 m)     Intake/Output Summary (Last 24 hours) at 12/08/2020 1446 Last data filed at 12/08/2020 0807 Gross per 24 hour  Intake 300 ml  Output --  Net 300 ml   Last 3 Weights 12/08/2020 12/08/2020 10/23/2020  Weight (lbs) 195 lb 1.7 oz 192 lb 197 lb 6.4 oz  Weight (kg) 88.5 kg 87.091 kg 89.54 kg      Body mass index is 30.56 kg/m.  General:  Obese female in no acute distress HEENT: normal Neck: + JVD Vascular: No carotid bruits  Cardiac:  Irregular rhythm, tachycardic rate Lungs:  Diminished in bases  Abd: soft, nontender, no hepatomegaly  Ext: mild B LE edema Musculoskeletal:  No deformities, BUE and BLE strength normal and equal Skin: warm and dry  Neuro:  CNs 2-12 intact, no focal abnormalities noted Psych:  Normal affect   EKG:  The EKG was personally reviewed and demonstrates:  Atrial fibrillation with ventricular rate 116 Telemetry:  Telemetry was personally reviewed and demonstrates:  Afib rates 90-110s, PVCs and paired PVCs  Relevant CV Studies:  Echo pending   Echo 06/27/20: 1. Global hypokinesis with akinesis of the inferior and inferolateral  wall.  2. Left ventricular ejection fraction, by estimation, is 30 to 35%. The  left ventricle has moderate to severely decreased function. The left  ventricle demonstrates regional wall motion abnormalities (see scoring  diagram/findings for description). There  is moderate left ventricular hypertrophy. Left ventricular diastolic  function could not be evaluated.  3. Right ventricular systolic function is normal. The right ventricular  size is normal. There is normal pulmonary artery systolic pressure.  4. Left atrial size was severely dilated.  5. Right atrial size was moderately dilated.  6. The mitral valve is normal in structure. Mild mitral valve  regurgitation. No evidence of mitral stenosis.  7. The aortic valve is tricuspid. Aortic valve regurgitation is trivial.  Mild to moderate aortic valve sclerosis/calcification is present, without  any evidence of aortic stenosis.  8. The inferior vena cava is dilated in size with >50% respiratory  variability, suggesting right atrial pressure of 8 mmHg.   Laboratory Data:  High Sensitivity Troponin:   Recent Labs  Lab 11/15/20 0951 11/15/20 1152  12/08/20 0936 12/08/20 0950  TROPONINIHS 23* 21* 20* 25*     Chemistry Recent Labs  Lab 12/08/20 0936  NA 141  K 4.4  CL 110  CO2 19*  GLUCOSE 126*  BUN 31*  CREATININE 1.47*  CALCIUM 8.8*  GFRNONAA 37*  ANIONGAP 12    Recent Labs  Lab 12/08/20 0936  PROT 5.5*  ALBUMIN 2.7*  AST 25  ALT 14  ALKPHOS 120  BILITOT 2.0*   Hematology Recent Labs  Lab 12/08/20 0830  WBC 6.9  RBC 4.89  HGB 12.9  HCT 42.9  MCV 87.7  MCH 26.4  MCHC 30.1  RDW 20.8*  PLT 127*   BNP Recent Labs  Lab 12/08/20 0830  BNP 1,065.4*    DDimer No results for input(s): DDIMER in the last 168 hours.   Radiology/Studies:  [facility] Chest Port 1 View  Result Date: 12/08/2020 CLINICAL DATA:  Questionable sepsis. EXAM: PORTABLE CHEST 1 VIEW COMPARISON:  11/15/2020.  06/26/2020.  03/23/2018. FINDINGS: Prior CABG. Cardiomegaly. No pulmonary venous congestion. Very mild bilateral interstitial prominence. Very mild interstitial edema and/or pneumonitis cannot be excluded. Mild left base subsegmental  atelectasis and or scarring. No pleural effusion or pneumothorax. Degenerative change thoracic spine. IMPRESSION: 1. Prior CABG. Cardiomegaly. Very mild bilateral interstitial prominence. Very mild interstitial edema and/or pneumonitis cannot be excluded. 2. Mild left base subsegmental atelectasis and or scarring Electronically Signed   By: Marcello Moores  Register   On: 12/08/2020 09:13     Assessment and Plan:   Acute on chronic systolic and diastolic heart failure - elevated BNP 1100 - EF 30-35% - repeat echo pending - has received IV lasix 60 mg x 1 dose - she has been drinking excess fluids at home - suspect ongoing RVR is contributing to CHF exacerbation along with COPD, but likely other factors contributing to respiratory failure, lethargy and drowsiness - will await echo results, but I think restoring NSR should be considered - I have D/C'ed IV lasix and restarted home PO diuretic - I have restarted  losartan - I have ordered ABG and ammonia   Chronic respiratory failure COPD - lethargic - but states she generally naps during the day - venous blood gas with normal pH, low CO2 and low O2 - suspect COPD is contributing to her respiratory failure along with CHF exacerbation - consider checking an ammonia and ABG - lactic acid reassuring   Persistent atrial fibrillation with RVR  - HR in the 90-110s - digoxin level 0.6 - continue metoprolol 100 mg BID - I suspect we are nearing the time that restoring sinus rhythm will be necessary - she is ready to discuss ablation and PPM placement - consider EP consult once stable respiratory status   CAD s/p CABG x 2 (2016) - no ASA given xarelto - continue BB   Non-adherence  - she is unsure if she has missed doses of xarelto   Risk Assessment/Risk Scores:     New York Heart Association (NYHA) Functional Class NYHA Class IV  CHA2DS2-VASc Score = 7  This indicates a 11.2% annual risk of stroke. [name] score is based upon: CHF History: Yes HTN History: Yes Diabetes History: Yes Stroke History: No Vascular Disease History: Yes Age Score: 2 Gender Score: 1       For questions or updates, please contact [place] Please consult [email or web address] for contact info under    Signed, Ledora Bottcher, [place]  12/08/2020 2:46 PM

## 2020-12-08 NOTE — ED Triage Notes (Signed)
Pt arrived via [facility] from home. EMS reports they were called out for pt c/o weakness, which pt states has not improved since she was "sick a few weeks ago." Pt was caox3 with some lethargy, no obvious neuro deficit. Pt denied pain, SOB, N/V.   EMS reports pt was initially hypotensive but BP improved after 300 mL NS bolus. Afib while in EMS care.

## 2020-12-08 NOTE — Progress Notes (Signed)
Critical lab value: PO2 <32. Date and time notified: 12/08/2020 at [place] Provider notified: Harvest [place], MD at [phone]. Orders received: no orders received at this time.

## 2020-12-08 NOTE — ED Provider Notes (Signed)
[facility] EMERGENCY DEPARTMENT Provider Note   CSN: [id number] Arrival date & time: 12/08/20  [id number]     History No chief complaint on file.   JACKQUELYN COTTEN is a 75 y.o. female history includes CHF, CABG, CAD, cardiac arrest, COPD, CKD, GERD, hypertension, hyperlipidemia, asthma, A. Fib.  Patient brought in by EMS today for generalized weakness, nausea and one episode of emesis.  Patient reports that she has been feeling sick for the past 3 weeks, she reports feeling generally weak no clear inciting event.  Patient reports around 3 weeks ago she had been experiencing shortness of breath she reports her pulmonologist changed some of her medications and since that time she has not had any respiratory issues but has still generally felt weak.  She reports some nausea prior to EMS arrival, she reports she is feeling much better since receiving IV fluids PTA.  Patient reports she has been compliant with all of her home medications.  Per EMS they were called out for the patient feeling "sick".  They arrived to find the patient lying on her couch, she was lethargic but responsive to verbal stimuli, they measured her blood pressure at 80 systolic palpated and gave her 300 mL normal saline bolus.  They report patient has been much more alert since that time, no confusion, no unilateral weakness, breath sounds were clear.  They report the patient was staying with an unknown female who was unable to provide additional information.  Patient denies fall/injury, headache, vision change, neck stiffness, chest pain/shortness of breath, abdominal pain, dysuria/hematuria, numbness, unilateral weakness or any additional concerns HPI     Past Medical History:  Diagnosis Date  . (HFimpEF) heart failure with improved ejection fraction ([place])    a. 07/2018 Echo: EF 30-35%; b. 11/2019 Echo: EF 55-60%, no rwma, Gr2 DD, Nl RV size/fxn. Mild BAE. Mild MR/AI.  Marland Kitchen Arthritis   . Asthma   . Atopic  dermatitis   . CAD (coronary artery disease)    a. 2016 s/p CABG x 2 (LIMA->LAD, VG->OM); b. 08/2018 MV: EF 44%, no ischemia/infact.  . Cardiac arrest ([place]) 10/2014  . Cardiomyopathy, ischemic    a. 07/2018 Echo: EF 30-35%; 11/2019 Echo: EF 55-60%.  . Carotid arterial disease ([place])    a. 11/2019 Carotid U/S  . CKD (chronic kidney disease), stage III ([place])   . COPD (chronic obstructive pulmonary disease) ([place])   . Esophageal dilatation 2013  . GERD (gastroesophageal reflux disease)   . Gout   . Headache   . Hypertension   . Hypokalemia   . Idiopathic angioedema   . LGI bleed 08/06/2017   a. felt to be hemorrhoidal during that admission (no drop in Hgb).  . Lower back pain   . Paroxysmal atrial fibrillation ([place])    a. Dx 2016-->h/o difficult to control rates (complicated by noncompliance), not felt to be a candidate for ablation or antiarrhythmic due to noncompliance; b. Recurrent AF 2021 - converted w/ IV dilt; c. CHA2DS2VASc = 7-->Xarelto.  . Personal history of noncompliance with medical treatment, presenting hazards to health   . Prediabetes   . S/P CABG x 2 with clipping of LA appendage 11/14/2014   LIMA to LAD, SVG to OM, EVH via right thigh  . Urine incontinence   . Uterine fibroid     Patient Active Problem List   Diagnosis Date Noted  . Acute respiratory failure with hypoxia ([place]) 12/08/2020  . Obstructive sleep apnea 09/21/2020  . Atrial fibrillation  with rapid ventricular response ([place]) 08/13/2020  . Acute lower GI bleeding 02/10/2020  . Tobacco dependence 02/10/2020  . Obesity, Class II, BMI 35-39.9 02/10/2020  . Acute upper GI bleeding 10/26/2019  . COPD (chronic obstructive pulmonary disease) ([place]) 10/26/2019  . Acute GI bleeding 10/09/2019  . Acute blood loss anemia 10/09/2019  . Postmenopausal vaginal bleeding 01/04/2018  . Snoring 01/04/2018  . Acute on chronic combined systolic and diastolic CHF (congestive heart failure) ([place])   . CHF exacerbation ([place])  10/16/2017  . Noncompliance 10/16/2017  . Acute on chronic congestive heart failure ([place])   . Thrombocytopenia ([place]) 09/14/2017  . Acute drug-induced gout of right foot   . Medication noncompliance due to cognitive impairment 09/06/2017  . Acute diastolic CHF (congestive heart failure) ([place]) 08/06/2017  . LGI bleed, likely hemorrhoids 08/06/2017  . Paroxysmal atrial fibrillation ([place]) 02/08/2017  . Cardiomyopathy, ischemic 02/08/2017  . Dysphagia 02/08/2017  . CKD (chronic kidney disease), stage III ([place]) 01/30/2017  . Pain in shoulder 02/08/2016  . Breast pain, left 02/08/2016  . Bilateral arm numbness and tingling while sleeping 02/08/2016  . Painful lumpy left breast 09/23/2015  . [place] intertrigo 02/11/2015  . S/P CABG x 2 11/14/2014  . Accelerated hypertension   . Cardiac arrest ([place]) 11/04/2014  . Left main coronary artery disease 11/04/2014  . Coronary artery disease due to lipid rich plaque   . Acute respiratory failure with hypoxemia ([place])   . Essential hypertension   . Atrial fibrillation with RVR ([place])   . Hypokalemia 10/30/2014  . Chronic combined systolic and diastolic CHF (congestive heart failure) ([place]) 10/30/2014  . NSTEMI (non-ST elevated myocardial infarction) ([place])   . DOE (dyspnea on exertion)   . CAP (community acquired pneumonia) 10/29/2014    Past Surgical History:  Procedure Laterality Date  . BALLOON DILATION N/A 10/10/2019   Procedure: BALLOON DILATION;  Surgeon: Otis Brace, MD;  [facility]: [facility];  Service: Gastroenterology;  Laterality: N/A;  . BIOPSY  10/10/2019   Procedure: BIOPSY;  Surgeon: Otis Brace, MD;  Location: [place];  Service: Gastroenterology;;  . BIOPSY  10/11/2019   Procedure: BIOPSY;  Surgeon: Otis Brace, MD;  Location: [place];  Service: Gastroenterology;;  . CARDIOVERSION N/A 11/18/2014   Procedure: CARDIOVERSION;  Surgeon: [name], MD;  Location: [place];  Service: Cardiovascular;   Laterality: N/A;  . CARDIOVERSION N/A 12/25/2017   Procedure: CARDIOVERSION;  Surgeon: [name], MD;  Location: [place];  Service: Cardiovascular;  Laterality: N/A;  . CLIPPING OF ATRIAL APPENDAGE N/A 11/14/2014   Procedure: CLIPPING OF ATRIAL APPENDAGE;  Surgeon: [name], MD;  Location: [place];  Service: Open Heart Surgery;  Laterality: N/A;  . COLONOSCOPY  2013  . COLONOSCOPY WITH PROPOFOL N/A 10/11/2019   Procedure: COLONOSCOPY WITH PROPOFOL;  Surgeon: Otis Brace, MD;  Location: [place];  Service: Gastroenterology;  Laterality: N/A;  . CORONARY ARTERY BYPASS GRAFT N/A 11/14/2014   Procedure: CORONARY ARTERY BYPASS GRAFTING (CABG)TIMES 2 USING LEFT INTERNAL MAMMARY ARTERY AND RIGHT SAPHENOUS VEIN HARVESTED ENDOSCOPICALLY;  Surgeon: [name], MD;  Location: [place];  Service: Open Heart Surgery;  Laterality: N/A;  . ESOPHAGOGASTRODUODENOSCOPY (EGD) WITH PROPOFOL N/A 10/10/2019   Procedure: ESOPHAGOGASTRODUODENOSCOPY (EGD) WITH PROPOFOL;  Surgeon: Otis Brace, MD;  [facility]: [facility];  Service: Gastroenterology;  Laterality: N/A;  . ESOPHAGOGASTRODUODENOSCOPY (EGD) WITH PROPOFOL N/A 10/27/2019   Procedure: ESOPHAGOGASTRODUODENOSCOPY (EGD) WITH PROPOFOL;  Surgeon: Ronnette Juniper, MD;  Location: [place];  Service: Gastroenterology;  Laterality:  N/A;  . GIVENS CAPSULE STUDY N/A 10/27/2019   Procedure: GIVENS CAPSULE STUDY;  Surgeon: Ronnette Juniper, MD;  Location: [place];  Service: Gastroenterology;  Laterality: N/A;  . LEFT HEART CATHETERIZATION WITH CORONARY ANGIOGRAM N/A 11/04/2014   Procedure: LEFT HEART CATHETERIZATION WITH CORONARY ANGIOGRAM;  Surgeon: Troy Sine, MD;  [facility];  Service: Cardiovascular;  Laterality: N/A;  . POLYPECTOMY  10/11/2019   Procedure: POLYPECTOMY;  Surgeon: Otis Brace, MD;  Location: [place] [facility];  Service: Gastroenterology;;  . TEE WITHOUT CARDIOVERSION N/A 11/14/2014   Procedure: TRANSESOPHAGEAL  ECHOCARDIOGRAM (TEE);  Surgeon: [name], MD;  Location: [place];  Service: Open Heart Surgery;  Laterality: N/A;  . TEE WITHOUT CARDIOVERSION N/A 12/25/2017   Procedure: TRANSESOPHAGEAL ECHOCARDIOGRAM (TEE);  Surgeon: [name], MD;  Location: [name] ENDOSCOPY;  Service: Cardiovascular;  Laterality: N/A;  . TEMPORARY PACEMAKER INSERTION  11/04/2014   Procedure: TEMPORARY PACEMAKER INSERTION;  Surgeon: Troy Sine, MD;  Location: [facility] CATH LAB;  Service: Cardiovascular;;  . TOOTH EXTRACTION       OB History   No obstetric history on file.     Family History  Problem Relation Age of Onset  . Cancer Mother        LYMPHOMA  . Heart disease Father   . Other Father        TB  . CVA Sister   . Prostate cancer Brother 60  . Diabetes Brother     Social History   Tobacco Use  . Smoking status: Current Every Day Smoker    Packs/day: 0.50    Years: 56.00    Pack years: 28.00    Types: Cigarettes  . Smokeless tobacco: Never Used  . Tobacco comment: smoking 2 cigarettes/day as of 10/23/20  Vaping Use  . Vaping Use: Never used  Substance Use Topics  . Alcohol use: No    Alcohol/week: 0.0 standard drinks  . Drug use: No    Home Medications Prior to Admission medications   Medication Sig Start Date End Date Taking? Authorizing Provider  acetaminophen (TYLENOL) 500 MG tablet Take 1,000 mg by mouth every 6 (six) hours as needed for moderate pain.    [provider]  allopurinol (ZYLOPRIM) 300 MG tablet Take 1 tablet (300 mg total) by mouth daily. 01/04/18   Georgiana Shore, NP  atorvastatin (LIPITOR) 10 MG tablet Take 10 mg by mouth at bedtime.    [provider]  digoxin (LANOXIN) 0.125 MG tablet Take 0.5 tablets (0.0625 mg total) by mouth daily. 08/11/20   Evans Lance, MD  losartan (COZAAR) 25 MG tablet Take 1 tablet (25 mg total) by mouth daily. 10/19/20 01/17/21  Baldwin Jamaica, PA-C  metFORMIN (GLUCOPHAGE) 500 MG tablet Take 500 mg by mouth See admin  instructions. Daily with a meal    [provider]  metoprolol succinate (TOPROL-XL) 100 MG 24 hr tablet Take 1 tablet (100 mg total) by mouth 2 (two) times daily. 08/15/20   Kroeger, Lorelee Cover., PA-C  pantoprazole (PROTONIX) 40 MG tablet Take 40 mg by mouth in the morning.    [provider]  rivaroxaban (XARELTO) 20 MG TABS tablet Take 1 tablet (20 mg total) by mouth daily with supper. Resume from 11/01/2019 11/01/19   Aline August, MD  Tiotropium Bromide Monohydrate (SPIRIVA RESPIMAT) 2.5 MCG/ACT AERS Inhale 2 puffs into the lungs daily. 10/23/20   Parrett, Fonnie Mu, NP  torsemide (DEMADEX) 20 MG tablet Take 2 tablets (40 mg  total) by mouth daily. 08/16/20   Kroeger, Ovidio Kin., PA-C    Allergies    Bee venom, Ivp dye [iodinated diagnostic agents], Codeine, Entresto [sacubitril-valsartan], Shrimp [shellfish allergy], and Jardiance [empagliflozin]  Review of Systems   Review of Systems Ten systems are reviewed and are negative for acute change except as noted in the HPI  Physical Exam Updated Vital Signs BP 114/82   Pulse 91   Temp 98 F (36.7 C) (Oral)   Resp (!) 21   Ht 5\' 7"  (1.702 m)   Wt 87.1 kg   SpO2 100%   BMI 30.07 kg/m   Physical Exam Constitutional:      General: She is not in acute distress.    Appearance: Normal appearance. She is well-developed. She is not ill-appearing or diaphoretic.  HENT:     Head: Normocephalic and atraumatic.  Eyes:     General: Vision grossly intact. Gaze aligned appropriately.     Pupils: Pupils are equal, round, and reactive to light.  Neck:     Trachea: Trachea and phonation normal.  Pulmonary:     Effort: Pulmonary effort is normal. No respiratory distress.  Abdominal:     General: There is no distension.     Palpations: Abdomen is soft.     Tenderness: There is no abdominal tenderness. There is no guarding or rebound.  Musculoskeletal:        General: Normal range of motion.     Cervical back: Normal range of motion.   Skin:    General: Skin is warm and dry.  Neurological:     Mental Status: She is alert.     GCS: GCS eye subscore is 4. GCS verbal subscore is 5. GCS motor subscore is 6.     Comments: Speech is clear and goal oriented, follows commands Major Cranial nerves without deficit, no facial droop Moves extremities without ataxia, coordination intact  Psychiatric:        Behavior: Behavior normal.     ED Results / Procedures / Treatments   Labs (all labs ordered are listed, but only abnormal results are displayed) Labs Reviewed  CBC WITH DIFFERENTIAL/PLATELET - Abnormal; Notable for the following components:      Result Value   RDW 20.8 (*)    Platelets 127 (*)    All other components within normal limits  URINALYSIS, ROUTINE W REFLEX MICROSCOPIC - Abnormal; Notable for the following components:   Protein, ur 30 (*)    All other components within normal limits  BRAIN NATRIURETIC PEPTIDE - Abnormal; Notable for the following components:   B Natriuretic Peptide 1,065.4 (*)    All other components within normal limits  DIGOXIN LEVEL - Abnormal; Notable for the following components:   Digoxin Level 0.6 (*)    All other components within normal limits  PROTIME-INR - Abnormal; Notable for the following components:   Prothrombin Time 18.6 (*)    INR 1.6 (*)    All other components within normal limits  APTT - Abnormal; Notable for the following components:   aPTT 37 (*)    All other components within normal limits  COMPREHENSIVE METABOLIC PANEL - Abnormal; Notable for the following components:   CO2 19 (*)    Glucose, Bld 126 (*)    BUN 31 (*)    Creatinine, Ser 1.47 (*)    Calcium 8.8 (*)    Total Protein 5.5 (*)    Albumin 2.7 (*)    Total Bilirubin 2.0 (*)  GFR, Estimated 37 (*)    All other components within normal limits  CBG MONITORING, ED - Abnormal; Notable for the following components:   Glucose-Capillary 127 (*)    All other components within normal limits  TROPONIN  I (HIGH SENSITIVITY) - Abnormal; Notable for the following components:   Troponin I (High Sensitivity) 20 (*)    All other components within normal limits  TROPONIN I (HIGH SENSITIVITY) - Abnormal; Notable for the following components:   Troponin I (High Sensitivity) 25 (*)    All other components within normal limits  RESP PANEL BY RT-PCR (FLU A&B, COVID) ARPGX2  CULTURE, BLOOD (SINGLE)  URINE CULTURE  CULTURE, BLOOD (SINGLE)  LACTIC ACID, PLASMA  LACTIC ACID, PLASMA  RAPID URINE DRUG SCREEN, HOSP PERFORMED  CBG MONITORING, ED    EKG EKG Interpretation  Date/Time:  Tuesday December 08 2020 09:09:37 EDT Ventricular Rate:  116 PR Interval:    QRS Duration: 102 QT Interval:  338 QTC Calculation: 470 R Axis:   23 Text Interpretation: Atrial fibrillation Ventricular premature complex Probable LVH with secondary repol abnrm Non-specific ST-t changes Confirmed by Lajean Saver [phone]) on 12/08/2020 9:13:20 AM   Radiology DG Chest Port 1 View  Result Date: 12/08/2020 CLINICAL DATA:  Questionable sepsis. EXAM: PORTABLE CHEST 1 VIEW COMPARISON:  11/15/2020.  06/26/2020.  03/23/2018. FINDINGS: Prior CABG. Cardiomegaly. No pulmonary venous congestion. Very mild bilateral interstitial prominence. Very mild interstitial edema and/or pneumonitis cannot be excluded. Mild left base subsegmental atelectasis and or scarring. No pleural effusion or pneumothorax. Degenerative change thoracic spine. IMPRESSION: 1. Prior CABG. Cardiomegaly. Very mild bilateral interstitial prominence. Very mild interstitial edema and/or pneumonitis cannot be excluded. 2. Mild left base subsegmental atelectasis and or scarring Electronically Signed   By: Soldier   On: 12/08/2020 09:13    Procedures .Critical Care Performed by: Deliah Boston, PA-C Authorized by: Deliah Boston, PA-C   Critical care provider statement:    Critical care time (minutes):  35   Critical care was necessary to treat or  prevent imminent or life-threatening deterioration of the following conditions:  Respiratory failure   Critical care was time spent personally by me on the following activities:  Discussions with consultants, evaluation of patient's response to treatment, examination of patient, ordering and performing treatments and interventions, ordering and review of laboratory studies, ordering and review of radiographic studies, pulse oximetry, re-evaluation of patient's condition, obtaining history from patient or surrogate, review of old charts and development of treatment plan with patient or surrogate     Medications Ordered in ED Medications  sodium chloride 0.9 % bolus 500 mL (0 mLs Intravenous Stopped 12/08/20 1000)  albuterol (VENTOLIN HFA) 108 (90 Base) MCG/ACT inhaler 2 puff (2 puffs Inhalation Given 12/08/20 0900)  furosemide (LASIX) injection 60 mg (60 mg Intravenous Given 12/08/20 1000)    ED Course  I have reviewed the triage vital signs and the nursing notes.  Pertinent labs & imaging results that were available during my care of the patient were reviewed by me and considered in my medical decision making (see chart for details).  Clinical Course as of 12/08/20 1107  Tue Dec 08, 2020  1043 Dr. Tamala Julian [BM]    Clinical Course User Index [BM] Gari Crown   MDM Rules/Calculators/A&P                         Additional history obtained from: 1. Nursing notes from this visit.  2. EMS personnel. 3. Review of electronic medical records: Patient seen in ER 11/15/2020, diagnosis shortness of breath A. fib RVR.  Patient was found to have low digoxin level and slightly elevated BNP, other labs were baseline she was treated with IV Lasix, Solu-Medrol, metoprolol and albuterol.  Patient has been weaned off oxygen and was discharged. --- 75 year old female history as above presented for nausea generalized weakness times several weeks.  She was lethargic with EMS this improved following a  small normal saline bolus.  On my initial evaluation patient is alert oriented and in no acute distress but does appear somewhat tired.  She reports her nausea has resolved following normal saline bolus.  She is tachycardic in A. fib, slightly tachypneic, lung sounds clear.  Will obtain screening labs, chest x-ray EKG and monitor patient. ---------- I ordered, reviewed and interpreted labs which include: Urinalysis shows no evidence of infection. High-sensitivity troponin of 20, appears baseline. CMP shows no emergent electrolyte derangement, kidney function appears baseline, no emergent LFT elevations, no gap. COVID/influenza panel negative. Lactic within normal limits. Digoxin level low at 0.6 CBC shows no leukocytosis or anemia. CBG 127. BNP elevated 1065  CXR:  IMPRESSION:  1. Prior CABG. Cardiomegaly. Very mild bilateral interstitial  prominence. Very mild interstitial edema and/or pneumonitis cannot  be excluded. 2. Mild left base subsegmental atelectasis and or  scarring   EKG: Atrial fibrillation Ventricular premature complex Probable LVH with secondary repol abnrm Non-specific ST-t changes Confirmed by Lajean Saver [phone]) on 12/08/2020 9:13:20 AM ---------- Patient reassessed sleeping comfortably easily arousable to voice.  Reports she is feeling well denies any pain.  Patient's SPO2 dropped into the 70s on room air while she was sleeping, when aroused SPO2 improved to the 80s.  She is now on 4 L nasal cannula and satting well in the upper 90s.  Suspect patient's hypoxia due to CHF exacerbation, patient was given IV Lasix in the emergency department along with albuterol.  Patient will be admitted to medicine team for further evaluation and treatment.  Patient seen and evaluated by Dr. Ashok Cordia who agrees with plan. = 10:43 AM: Consult with Dr. Tamala Julian, patient was accepted to medicine service.  Patient reassessed she is resting comfortably no acute distress, states understanding of  care plan, she has no questions or current complaints.  Note: Portions of this report may have been transcribed using voice recognition software. Every effort was made to ensure accuracy; however, inadvertent computerized transcription errors may still be present. Final Clinical Impression(s) / ED Diagnoses Final diagnoses:  Acute respiratory failure with hypoxia ([place])  Acute on chronic congestive heart failure, unspecified heart failure type [facility])    Rx / DC Orders ED Discharge Orders    None       Gari Crown 12/08/20 1107    Lajean Saver, MD 12/10/20 1558

## 2020-12-08 NOTE — Progress Notes (Signed)
RT NOTES: ABG obtained and sent to lab. Lab tech notified.  

## 2020-12-08 NOTE — Progress Notes (Signed)
  Echocardiogram 2D Echocardiogram has been performed.  [name] [name] 12/08/2020, 4:28 PM

## 2020-12-08 NOTE — Progress Notes (Signed)
Doreene Adas, PA currently at bedside. Verbal orders received from PA to hold Losartan at this time. Will carry out orders.

## 2020-12-08 NOTE — Plan of Care (Signed)
  Problem: Coping: Goal: Level of anxiety will decrease Outcome: Progressing   Problem: Elimination: Goal: Will not experience complications related to urinary retention Outcome: Progressing   Problem: Safety: Goal: Ability to remain free from injury will improve Outcome: Progressing   

## 2020-12-08 NOTE — H&P (Addendum)
History and Physical    [name] [name] EGB:[id number] DOB: Jun 19, 1946 DOA: 12/08/2020  Referring MD/NP/PA: Nuala Alpha, PA-C PCP: [name], MD  Patient coming from: Home via EMS  Chief Complaint: Weakness  I have personally briefly reviewed patient's old medical records in [place]   HPI: [name] [name] is a 75 y.o. female with medical history significant of persistent A. fib, HTN, HLD, CHF with EF 30 to 35%, CAD s/p CABG in 2016, cardiac arrest, CKD stage III, COPD, GERD, and gout who presents with pain weakness.  History is somewhat difficult to obtain from the patient.  Notes that she has not been feeling well for 3 weeks.  Patient denies feeling short of breath, orthopnea, or significant lower extremity swelling.  She reports that she is taking her medications as advised.  She says someone called yesterday and advised her to to double up on furosemide possibly, but it is not totally clear.  She does not seem to know the names of medications that she is taking.  However, reports after being advised to double up on the medication she had been more weak.  Pharmacy makes note that she had recently filled a new prescription for furosemide 20 mg twice daily and tizanidine 2 mg nightly.  Noted associated symptoms of  palpitations, nausea, and 1 episode of gagging prior to EMS arrival.  Patient notes that she has someone that stays with her overnight.  Denies any recent falls, focal weakness, chest pain, cough, dysuria, or fever.  She had recently had a Zio monitor performed in 10/2020 showing A. fib with slow VR/controlled VR/ RVR, frequent PVCs, and NSVT.Last seen in the emergency department on 4/3 for complaints of chest pain and shortness of breath.  She was noted to be in A. fib with RVR and hypoxic down to 85% with improvement on 2 L nasal cannula oxygen.  She was treated with steroids, albuterol, and given 40 mg of Lasix IV after talks with [name] of cardiology.  Patient was able  to be weaned off of oxygen and discharged home.  It appears that he had previously recommended ablation, but patient declined previously.   In route with EMS patient was seen to be in atrial fibrillation and hypotensive with systolic blood pressures in 80s.  She was given 300 mL of normal saline IV fluid bolus.  ED Course: Upon admission to the emergency department patient was seen to be afebrile, pulse 76 112, respirations 17-32, blood pressures maintained, and O2 saturation reported as low as 70- 80 on room air with improvement on 2 L nasal cannula oxygen.  Labs significant for platelets 127, BUN 31, creatinine 1.47, albumin 2.7, total bilirubin 2, digoxin level 0.6,  high-sensitivity troponin 20->25, and  BNP 1065.4.  Urinalysis showed no signs of infection.  UDS was negative.  COVID-19 screening and influenza were negative.  Patient had initially been given 500 mL bolus of IV fluids upon arrival, but subsequently was given Lasix 60 mg IV as she was thought to be more likely fluid overloaded.  Review of Systems  Constitutional: Positive for malaise/fatigue. Negative for fever.  HENT: Negative for ear discharge and nosebleeds.   Eyes: Negative for photophobia and pain.  Respiratory: Negative for cough and shortness of breath.   Cardiovascular: Positive for palpitations. Negative for leg swelling.  Gastrointestinal: Positive for nausea and vomiting. Negative for abdominal pain.  Genitourinary: Negative for dysuria.  Musculoskeletal: Negative for falls.  Neurological: Positive for weakness.  Psychiatric/Behavioral: Positive  for substance abuse.    Past Medical History:  Diagnosis Date  . (HFimpEF) heart failure with improved ejection fraction ([place])    a. 07/2018 Echo: EF 30-35%; b. 11/2019 Echo: EF 55-60%, no rwma, Gr2 DD, Nl RV size/fxn. Mild BAE. Mild MR/AI.  Marland Kitchen Arthritis   . Asthma   . Atopic dermatitis   . CAD (coronary artery disease)    a. 2016 s/p CABG x 2 (LIMA->LAD, VG->OM); b.  08/2018 MV: EF 44%, no ischemia/infact.  . Cardiac arrest ([place]) 10/2014  . Cardiomyopathy, ischemic    a. 07/2018 Echo: EF 30-35%; 11/2019 Echo: EF 55-60%.  . Carotid arterial disease ([place])    a. 11/2019 Carotid U/S  . CKD (chronic kidney disease), stage III ([place])   . COPD (chronic obstructive pulmonary disease) ([place])   . Esophageal dilatation 2013  . GERD (gastroesophageal reflux disease)   . Gout   . Headache   . Hypertension   . Hypokalemia   . Idiopathic angioedema   . LGI bleed 08/06/2017   a. felt to be hemorrhoidal during that admission (no drop in Hgb).  . Lower back pain   . Paroxysmal atrial fibrillation ([place])    a. Dx 2016-->h/o difficult to control rates (complicated by noncompliance), not felt to be a candidate for ablation or antiarrhythmic due to noncompliance; b. Recurrent AF 2021 - converted w/ IV dilt; c. CHA2DS2VASc = 7-->Xarelto.  . Personal history of noncompliance with medical treatment, presenting hazards to health   . Prediabetes   . S/P CABG x 2 with clipping of LA appendage 11/14/2014   LIMA to LAD, SVG to OM, EVH via right thigh  . Urine incontinence   . Uterine fibroid     Past Surgical History:  Procedure Laterality Date  . BALLOON DILATION N/A 10/10/2019   Procedure: BALLOON DILATION;  Surgeon: Otis Brace, MD;  Location: [facility];  Service: Gastroenterology;  Laterality: N/A;  . BIOPSY  10/10/2019   Procedure: BIOPSY;  Surgeon: Otis Brace, MD;  Location: [place];  Service: Gastroenterology;;  . BIOPSY  10/11/2019   Procedure: BIOPSY;  Surgeon: Otis Brace, MD;  Location: [place];  Service: Gastroenterology;;  . CARDIOVERSION N/A 11/18/2014   Procedure: CARDIOVERSION;  Surgeon: [name], MD;  Location: [place];  Service: Cardiovascular;  Laterality: N/A;  . CARDIOVERSION N/A 12/25/2017   Procedure: CARDIOVERSION;  Surgeon: [name], MD;  Location: [place];  Service: Cardiovascular;  Laterality: N/A;  .  CLIPPING OF ATRIAL APPENDAGE N/A 11/14/2014   Procedure: CLIPPING OF ATRIAL APPENDAGE;  Surgeon: [name], MD;  Location: [place];  Service: Open Heart Surgery;  Laterality: N/A;  . COLONOSCOPY  2013  . COLONOSCOPY WITH PROPOFOL N/A 10/11/2019   Procedure: COLONOSCOPY WITH PROPOFOL;  Surgeon: Otis Brace, MD;  Location: [place];  Service: Gastroenterology;  Laterality: N/A;  . CORONARY ARTERY BYPASS GRAFT N/A 11/14/2014   Procedure: CORONARY ARTERY BYPASS GRAFTING (CABG)TIMES 2 USING LEFT INTERNAL MAMMARY ARTERY AND RIGHT SAPHENOUS VEIN HARVESTED ENDOSCOPICALLY;  Surgeon: [name], MD;  Location: [place];  Service: Open Heart Surgery;  Laterality: N/A;  . ESOPHAGOGASTRODUODENOSCOPY (EGD) WITH PROPOFOL N/A 10/10/2019   Procedure: ESOPHAGOGASTRODUODENOSCOPY (EGD) WITH PROPOFOL;  Surgeon: Otis Brace, MD;  [facility];  Service: Gastroenterology;  Laterality: N/A;  . ESOPHAGOGASTRODUODENOSCOPY (EGD) WITH PROPOFOL N/A 10/27/2019   Procedure: ESOPHAGOGASTRODUODENOSCOPY (EGD) WITH PROPOFOL;  Surgeon: Ronnette Juniper, MD;  Location: [place];  Service: Gastroenterology;  Laterality: N/A;  . GIVENS CAPSULE STUDY N/A  10/27/2019   Procedure: GIVENS CAPSULE STUDY;  Surgeon: Ronnette Juniper, MD;  Location: [place];  Service: Gastroenterology;  Laterality: N/A;  . LEFT HEART CATHETERIZATION WITH CORONARY ANGIOGRAM N/A 11/04/2014   Procedure: LEFT HEART CATHETERIZATION WITH CORONARY ANGIOGRAM;  Surgeon: Troy Sine, MD;  Location[facility] CATH LAB;  Service: Cardiovascular;  Laterality: N/A;  . POLYPECTOMY  10/11/2019   Procedure: POLYPECTOMY;  Surgeon: Otis Brace, MD;  Location: [place] [facility];  Service: Gastroenterology;;  . TEE WITHOUT CARDIOVERSION N/A 11/14/2014   Procedure: TRANSESOPHAGEAL ECHOCARDIOGRAM (TEE);  Surgeon: [name], MD;  Location: [place];  Service: Open Heart Surgery;  Laterality: N/A;  . TEE WITHOUT CARDIOVERSION N/A 12/25/2017   Procedure:  TRANSESOPHAGEAL ECHOCARDIOGRAM (TEE);  Surgeon: [name], MD;  Location: [facility] ENDOSCOPY;  Service: Cardiovascular;  Laterality: N/A;  . TEMPORARY PACEMAKER INSERTION  11/04/2014   Procedure: TEMPORARY PACEMAKER INSERTION;  Surgeon: Troy Sine, MD;  Location[facility] CATH LAB;  Service: Cardiovascular;;  . TOOTH EXTRACTION       reports that she has been smoking cigarettes. She has a 28.00 pack-year smoking history. She has never used smokeless tobacco. She reports that she does not drink alcohol and does not use drugs.  Allergies  Allergen Reactions  . Bee Venom Anaphylaxis  . Ivp Dye [Iodinated Diagnostic Agents] Anaphylaxis  . Codeine Nausea And Vomiting  . Entresto [Sacubitril-Valsartan] Other (See Comments)    Chest pain   . Shrimp [Shellfish Allergy] Swelling  . Jardiance [Empagliflozin] Other (See Comments)    Caused Boils     Family History  Problem Relation Age of Onset  . Cancer Mother        LYMPHOMA  . Heart disease Father   . Other Father        TB  . CVA Sister   . Prostate cancer Brother 10  . Diabetes Brother     Prior to Admission medications   Medication Sig Start Date End Date Taking? Authorizing Provider  acetaminophen (TYLENOL) 500 MG tablet Take 1,000 mg by mouth every 6 (six) hours as needed for moderate pain.    [provider]  allopurinol (ZYLOPRIM) 300 MG tablet Take 1 tablet (300 mg total) by mouth daily. 01/04/18   Georgiana Shore, NP  atorvastatin (LIPITOR) 10 MG tablet Take 10 mg by mouth at bedtime.    [provider]  digoxin (LANOXIN) 0.125 MG tablet Take 0.5 tablets (0.0625 mg total) by mouth daily. 08/11/20   Evans Lance, MD  losartan (COZAAR) 25 MG tablet Take 1 tablet (25 mg total) by mouth daily. 10/19/20 01/17/21  Baldwin Jamaica, PA-C  metFORMIN (GLUCOPHAGE) 500 MG tablet Take 500 mg by mouth See admin instructions. Daily with a meal    [provider]  metoprolol succinate (TOPROL-XL) 100 MG 24 hr  tablet Take 1 tablet (100 mg total) by mouth 2 (two) times daily. 08/15/20   Kroeger, Lorelee Cover., PA-C  pantoprazole (PROTONIX) 40 MG tablet Take 40 mg by mouth in the morning.    [provider]  rivaroxaban (XARELTO) 20 MG TABS tablet Take 1 tablet (20 mg total) by mouth daily with supper. Resume from 11/01/2019 11/01/19   Aline August, MD  Tiotropium Bromide Monohydrate (SPIRIVA RESPIMAT) 2.5 MCG/ACT AERS Inhale 2 puffs into the lungs daily. 10/23/20   Parrett, Fonnie Mu, NP  torsemide (DEMADEX) 20 MG tablet Take 2 tablets (40 mg total) by mouth daily. 08/16/20   Abigail Butts., PA-C  Physical Exam:  Constitutional: Elderly female who appears to be lethargic, but is able to follow commands Vitals:   12/08/20 0941 12/08/20 1000 12/08/20 1015 12/08/20 1021  BP: (!) 126/94 114/79 114/82   Pulse: 94 87 91 91  Resp: (!) 34 (!) 28 (!) 26 (!) 21  Temp:      TempSrc:      SpO2: 100% 100% 100% 100%  Weight:      Height:       Eyes: PERRL, lids and conjunctivae normal ENMT: Mucous membranes are dry. Posterior pharynx clear of any exudate or lesions.  Neck: normal, supple, no masses, no thyromegaly.  JVD appears to be present Respiratory: Decreased overall aeration with mild expiratory wheeze appreciated.  Patient currently on 4 L of nasal cannula oxygen with O2 saturations maintained. Cardiovascular: Irregular irregular, no murmurs / rubs / gallops.  Trace lower extremity edema. 2+ pedal pulses. No carotid bruits.  Abdomen: no tenderness, no masses palpated. No hepatosplenomegaly. Bowel sounds positive.  Musculoskeletal: no clubbing / cyanosis. No joint deformity upper and lower extremities. Good ROM, no contractures. Normal muscle tone.  Skin: no rashes, lesions, ulcers. No induration Neurologic: CN 2-12 grossly intact. Sensation intact, DTR normal. Strength 5/5 in all 4.  Psychiatric: Lethargic but otherwise oriented x3.    Labs on Admission: I have personally reviewed following  labs and imaging studies  CBC: Recent Labs  Lab 12/08/20 0830  WBC 6.9  NEUTROABS 5.2  HGB 12.9  HCT 42.9  MCV 87.7  PLT [id number]   Basic Metabolic Panel: Recent Labs  Lab 12/08/20 0936  NA 141  K 4.4  CL 110  CO2 19*  GLUCOSE 126*  BUN 31*  CREATININE 1.47*  CALCIUM 8.8*   GFR: Estimated Creatinine Clearance: 37.5 mL/min (A) (by C-G formula based on SCr of 1.47 mg/dL (H)). Liver Function Tests: Recent Labs  Lab 12/08/20 0936  AST 25  ALT 14  ALKPHOS 120  BILITOT 2.0*  PROT 5.5*  ALBUMIN 2.7*   No results for input(s): LIPASE, AMYLASE in the last 168 hours. No results for input(s): AMMONIA in the last 168 hours. Coagulation Profile: No results for input(s): INR, PROTIME in the last 168 hours. Cardiac Enzymes: No results for input(s): CKTOTAL, CKMB, CKMBINDEX, TROPONINI in the last 168 hours. BNP (last 3 results) No results for input(s): PROBNP in the last 8760 hours. HbA1C: No results for input(s): HGBA1C in the last 72 hours. CBG: Recent Labs  Lab 12/08/20 0810  GLUCAP 127*   Lipid Profile: No results for input(s): CHOL, HDL, LDLCALC, TRIG, CHOLHDL, LDLDIRECT in the last 72 hours. Thyroid Function Tests: No results for input(s): TSH, T4TOTAL, FREET4, T3FREE, THYROIDAB in the last 72 hours. Anemia Panel: No results for input(s): VITAMINB12, FOLATE, FERRITIN, TIBC, IRON, RETICCTPCT in the last 72 hours. Urine analysis:    Component Value Date/Time   COLORURINE AMBER (A) 10/16/2017 0353   APPEARANCEUR CLOUDY (A) 10/16/2017 0353   LABSPEC 1.017 10/16/2017 0353   PHURINE 7.0 10/16/2017 0353   GLUCOSEU NEGATIVE 10/16/2017 0353   HGBUR NEGATIVE 10/16/2017 0353   BILIRUBINUR NEGATIVE 10/16/2017 0353   BILIRUBINUR ng 12/25/2013 1342   KETONESUR NEGATIVE 10/16/2017 0353   PROTEINUR 30 (A) 10/16/2017 0353   UROBILINOGEN 0.2 04/11/2017 1402   NITRITE NEGATIVE 10/16/2017 0353   LEUKOCYTESUR SMALL (A) 10/16/2017 0353   Sepsis Labs: Recent Results (from  the past 240 hour(s))  Resp Panel by RT-PCR (Flu A&B, Covid) Nasopharyngeal Swab     Status: None  Collection Time: 12/08/20  8:30 AM   Specimen: Nasopharyngeal Swab; Nasopharyngeal(NP) swabs in vial transport medium  Result Value Ref Range Status   SARS Coronavirus 2 by RT PCR NEGATIVE NEGATIVE Final    Comment: (NOTE) SARS-CoV-2 target nucleic acids are NOT DETECTED.  The SARS-CoV-2 RNA is generally detectable in upper respiratory specimens during the acute phase of infection. The lowest concentration of SARS-CoV-2 viral copies this assay can detect is 138 copies/mL. A negative result does not preclude SARS-Cov-2 infection and should not be used as the sole basis for treatment or other patient management decisions. A negative result may occur with  improper specimen collection/handling, submission of specimen other than nasopharyngeal swab, presence of viral mutation(s) within the areas targeted by this assay, and inadequate number of viral copies(<138 copies/mL). A negative result must be combined with clinical observations, patient history, and epidemiological information. The expected result is Negative.  Fact Sheet for Patients:  EntrepreneurPulse.com.au  Fact Sheet for Healthcare Providers:  IncredibleEmployment.be  This test is no t yet approved or cleared by the [place] FDA and  has been authorized for detection and/or diagnosis of SARS-CoV-2 by FDA under an Emergency Use Authorization (EUA). This EUA will remain  in effect (meaning this test can be used) for the duration of the COVID-19 declaration under Section 564(b)(1) of the Act, 21 U.S.C.section 360bbb-3(b)(1), unless the authorization is terminated  or revoked sooner.       Influenza A by PCR NEGATIVE NEGATIVE Final   Influenza B by PCR NEGATIVE NEGATIVE Final    Comment: (NOTE) The Xpert Xpress SARS-CoV-2/FLU/RSV plus assay is intended as an aid in the diagnosis of  influenza from Nasopharyngeal swab specimens and should not be used as a sole basis for treatment. Nasal washings and aspirates are unacceptable for Xpert Xpress SARS-CoV-2/FLU/RSV testing.  Fact Sheet for Patients: EntrepreneurPulse.com.au  Fact Sheet for Healthcare Providers: IncredibleEmployment.be  This test is not yet approved or cleared by the [place] FDA and has been authorized for detection and/or diagnosis of SARS-CoV-2 by FDA under an Emergency Use Authorization (EUA). This EUA will remain in effect (meaning this test can be used) for the duration of the COVID-19 declaration under Section 564(b)(1) of the Act, 21 U.S.C. section 360bbb-3(b)(1), unless the authorization is terminated or revoked.  Performed at [facility], [place] [address], [place], [place] [address]      Radiological Exams on Admission: DG Chest Port 1 View  Result Date: 12/08/2020 CLINICAL DATA:  Questionable sepsis. EXAM: PORTABLE CHEST 1 VIEW COMPARISON:  11/15/2020.  06/26/2020.  03/23/2018. FINDINGS: Prior CABG. Cardiomegaly. No pulmonary venous congestion. Very mild bilateral interstitial prominence. Very mild interstitial edema and/or pneumonitis cannot be excluded. Mild left base subsegmental atelectasis and or scarring. No pleural effusion or pneumothorax. Degenerative change thoracic spine. IMPRESSION: 1. Prior CABG. Cardiomegaly. Very mild bilateral interstitial prominence. Very mild interstitial edema and/or pneumonitis cannot be excluded. 2. Mild left base subsegmental atelectasis and or scarring Electronically Signed   By: [place]   On: 12/08/2020 09:13    EKG: Independently reviewed.  Atrial fibrillation 116 bpm with PVC  Assessment/Plan Acute respiratory failure with hypoxia: Patient presents with complaints of weakness.  Initially noted to have O2 saturations dropping to the 80s on room air for which she was placed on 4 L nasal cannula  oxygen.  On physical exam there is some mild JVD and trace lower extremity edema.  Labs significant for BNP of 1065.4.  Chest x-ray significant for  concern for mild bilateral interstitial prominence with very mild interstitial edema or pneumonitis that could not be excluded.  She had received 300 mL of fluid in route with EMS and additional 500 mL in the ED.  Suspect symptoms are secondary to her possibly being fluid overloaded and she had been given 60 mg of Lasix IV in the ED for -Admit to a cardiac telemetry bed -Heart failure orders set  initiated  -Continuous pulse oximetry with nasal cannula oxygen as needed to keep O2 saturations >92% -Check venous blood gas stat. Question if patient is retaining CO2 as a cause of her increased lethargy/no significant signs of infection at this time -Strict I&Os and daily weights -Elevate lower extremities -Reassess in a.m. and adjust diuresis as needed. -Check echocardiogram -Cardiology consult, will follow-up for any further recommendation  Systolic congestive heart failure: Acute on chronic.  Last EF noted to be around 30 to 35% back in 06/2020.  She possibly doubled up on her diuretic yesterday, but is not totally clear.  She appears to have -Follow-up echocardiogram  Persistent atrial fibrillation on chronic anticoagulation: Patient reports that she feels palpitations.  Noted to be atrial fibrillation with heart rates intermittently into the 120s. CHA2DS2-VASc score = 6.  Not noted to have any normal sinus rhythm during Zio monitor. Home medication regimen appears to include metoprolol -Continue metoprolol and digoxin as tolerated.  Possible COPD exacerbation: Patient with decreased overall aeration and mild expiratory wheezing on physical exam.  -Solu-Medrol IV 125 mg x once dose, and then start prednisone 40 mg daily -Albuterol nebs 4 times daily -Continue pharmacy substitution of Spiriva  Generalized weakness and lethargy: Patient had recently  been started on a new medication tizanidine 2 mg nightly per pharmacy records. -Follow-up venous blood gas -Hold tizanidine -PT/OT to evaluate and treat  Elevated troponin CAD: Chronic.  High-sensitivity troponin 20->25 which appears similar to previous.  Patient currently denies any complaints of chest pain.  Prior CABG in 2016.  Suspect secondary to demand in the setting of rapid heart rate. -Follow-up echocardiogram  Transient hypotension: Resolved.  Systolic blood pressures had been reported to be in the 80s for which patient had been given 300 mL of normal saline IV fluids in route with EMS.  Since that time blood pressures have been stable.  Patient received additional 500 mL of fluid in the ED, but subsequently had been given Lasix 60 mg IV.  Home medication regimen includes metoprolol 100 mg BID, digoxin 0.125 daily, spironolactone 5 mg daily,  furosemide 20 mg twice daily, and losartan 25 mg daily.  -Continue metoprolol and losartan  Hypoalbuminemia: On admission albumin noted to be 2.7.  Patient reports that she has been drinking ensures at home. -Check prealbumin in a.m.  Chronic kidney disease stage IIIb: Creatinine 1.46 which appears near around patient's baseline. -Continue to monitor kidney function daily  Diabetes mellitus type 2: Last hemoglobin A1c was 6.6 on 08/13/2020.  Home medications include metformin. -Hypoglycemic protocol -Add-on hemoglobin A1c -Hold metformin -CBGs before every meal with sensitive SSI -Adjust insulin regimen as  Thrombocytopenia: Chronic. -Continue to monitor  GERD -Continue Protonix  Obesity: BMI 30.07 kg/m  DVT prophylaxis: Xarelto Code Status: Full Family Communication: To be updated over the phone Disposition Plan: TBD  Consults called: cards Admission status: Inpatient, require more than 2 midnight stay  Norval Morton MD [facility]   If 7PM-7AM, please contact night-coverage   12/08/2020, 10:42 AM

## 2020-12-08 NOTE — Progress Notes (Signed)
Patient has 5 beats of v.tach, asymptomatic MD notified no new order given will continue to monitor.

## 2020-12-09 DIAGNOSIS — I428 Other cardiomyopathies: Secondary | ICD-10-CM

## 2020-12-09 LAB — BASIC METABOLIC PANEL
Anion gap: 12 (ref 5–15)
BUN: 34 mg/dL — ABNORMAL HIGH (ref 8–23)
CO2: 21 mmol/L — ABNORMAL LOW (ref 22–32)
Calcium: 9 mg/dL (ref 8.9–10.3)
Chloride: 109 mmol/L (ref 98–111)
Creatinine, Ser: 1.36 mg/dL — ABNORMAL HIGH (ref 0.44–1.00)
GFR, Estimated: 41 mL/min — ABNORMAL LOW (ref >60)
Glucose, Bld: 165 mg/dL — ABNORMAL HIGH (ref 70–99)
Potassium: 3.9 mmol/L (ref 3.5–5.1)
Sodium: 142 mmol/L (ref 135–145)

## 2020-12-09 LAB — GLUCOSE, CAPILLARY
Glucose-Capillary: 159 mg/dL — ABNORMAL HIGH (ref 70–99)
Glucose-Capillary: 153 mg/dL — ABNORMAL HIGH (ref 70–99)
Glucose-Capillary: 139 mg/dL — ABNORMAL HIGH (ref 70–99)

## 2020-12-09 LAB — URINE CULTURE: Culture: NO GROWTH

## 2020-12-09 LAB — PREALBUMIN: Prealbumin: 16.1 mg/dL — ABNORMAL LOW (ref 18–38)

## 2020-12-09 MED ORDER — PREDNISONE 20 MG PO TABS: 40.0000 mg | ORAL_TABLET | Freq: Every day | ORAL | 0 refills | Status: AC

## 2020-12-09 MED ORDER — ACETAMINOPHEN 325 MG PO TABS: 650.0000 mg | ORAL_TABLET | ORAL | Status: DC | PRN

## 2020-12-09 NOTE — Progress Notes (Signed)
Taxi voucher received to transport pt. Home. Pt. Discharged in alert and stable condition.

## 2020-12-09 NOTE — Progress Notes (Addendum)
Progress Note  Patient Name: [name] [name] Date of Encounter: 12/09/2020  [facility] Cardiologist: [name], MD   Subjective   Feels like she is back to her usual self. States breathing is back to normal and she is weaned off O2. No complaints of chest pain or LE edema. She is largely unaware of her atrial fibrillation. Thinks she may have missed a dose of xarelto within the past few weeks after she overslept.   Inpatient Medications    Scheduled Meds: . allopurinol  300 mg Oral Daily  . atorvastatin  10 mg Oral QHS  . digoxin  0.0625 mg Oral Daily  . insulin aspart  0-9 Units Subcutaneous TID WC  . losartan  25 mg Oral Daily  . metoprolol succinate  100 mg Oral BID  . pantoprazole  40 mg Oral q AM  . predniSONE  40 mg Oral Q breakfast  . rivaroxaban  20 mg Oral Q supper  . sodium chloride flush  3 mL Intravenous Q12H  . torsemide  40 mg Oral Daily  . umeclidinium bromide  1 puff Inhalation Daily   Continuous Infusions: . sodium chloride     PRN Meds: sodium chloride, acetaminophen, albuterol, ondansetron (ZOFRAN) IV, sodium chloride flush   Vital Signs    Vitals:   12/09/20 0350 12/09/20 0727 12/09/20 0857 12/09/20 0949  BP: 131/90 (!) 135/91  (!) 117/98  Pulse: 73 77  63  Resp: (!) 24 17  18   Temp: 97.6 F (36.4 C) (!) 97.3 F (36.3 C)    TempSrc: Oral Oral    SpO2: 98% 98% 97% 97%  Weight: 88.6 kg     Height:        Intake/Output Summary (Last 24 hours) at 12/09/2020 1035 Last data filed at 12/09/2020 0838 Gross per 24 hour  Intake 840 ml  Output 550 ml  Net 290 ml   Last 3 Weights 12/09/2020 12/08/2020 12/08/2020  Weight (lbs) 195 lb 5.2 oz 195 lb 1.7 oz 192 lb  Weight (kg) 88.6 kg 88.5 kg 87.091 kg      Telemetry    Atrial fibrillation with rates in the 90s-120s and occasional brief spikes to 140s - Personally Reviewed  ECG    No new tracings - Personally Reviewed  Physical Exam   GEN: No acute distress.   Neck: No JVD Cardiac: IRIR,  no murmurs, rubs, or gallops.  Respiratory: Clear to auscultation bilaterally. GI: Soft, nontender, non-distended  MS: No edema; No deformity. Neuro:  Nonfocal  Psych: Normal affect   Labs    High Sensitivity Troponin:   Recent Labs  Lab 11/15/20 0951 11/15/20 1152 12/08/20 0936 12/08/20 0950  TROPONINIHS 23* 21* 20* 25*      Chemistry Recent Labs  Lab 12/08/20 0936 12/09/20 0635  NA 141 142  K 4.4 3.9  CL 110 109  CO2 19* 21*  GLUCOSE 126* 165*  BUN 31* 34*  CREATININE 1.47* 1.36*  CALCIUM 8.8* 9.0  PROT 5.5*  --   ALBUMIN 2.7*  --   AST 25  --   ALT 14  --   ALKPHOS 120  --   BILITOT 2.0*  --   GFRNONAA 37* 41*  ANIONGAP 12 12     Hematology Recent Labs  Lab 12/08/20 0830  WBC 6.9  RBC 4.89  HGB 12.9  HCT 42.9  MCV 87.7  MCH 26.4  MCHC 30.1  RDW 20.8*  PLT 127*    BNP Recent Labs  Lab 12/08/20 0830  BNP 1,065.4*     DDimer No results for input(s): DDIMER in the last 168 hours.   Radiology    DG Chest Port 1 View  Result Date: 12/08/2020 CLINICAL DATA:  Questionable sepsis. EXAM: PORTABLE CHEST 1 VIEW COMPARISON:  11/15/2020.  06/26/2020.  03/23/2018. FINDINGS: Prior CABG. Cardiomegaly. No pulmonary venous congestion. Very mild bilateral interstitial prominence. Very mild interstitial edema and/or pneumonitis cannot be excluded. Mild left base subsegmental atelectasis and or scarring. No pleural effusion or pneumothorax. Degenerative change thoracic spine. IMPRESSION: 1. Prior CABG. Cardiomegaly. Very mild bilateral interstitial prominence. Very mild interstitial edema and/or pneumonitis cannot be excluded. 2. Mild left base subsegmental atelectasis and or scarring Electronically Signed   By: Marcello Moores  Register   On: 12/08/2020 09:13   ECHOCARDIOGRAM COMPLETE  Result Date: 12/08/2020    ECHOCARDIOGRAM REPORT   Patient Name:   [name] [name] Date of Exam: 12/08/2020 Medical Rec #:  [id number]     Height:       67.0 in Accession #:    [id number]     Weight:       195.1 lb Date of Birth:  12/26/1945     BSA:          2.001 m Patient Age:    75 years      BP:           111/72 mmHg Patient Gender: F             HR:           91 bpm. Exam Location:  Inpatient Procedure: 2D Echo, Cardiac Doppler and Color Doppler Indications:    CHF-Acute Systolic  History:        Patient has prior history of Echocardiogram examinations.                 Cardiomyopathy, CAD, COPD; Arrythmias:Atrial Fibrillation.                 Carotid disease. CKD. GERD. Prior CABGx2 with LA appendage                 clipping.  Sonographer:    Clayton Lefort RDCS (AE) Referring Phys: [id number] RONDELL A SMITH IMPRESSIONS  1. Left ventricular ejection fraction, by estimation, is 25 to 30%. Left ventricular ejection fraction by 3D volume is 22 %. The left ventricle has severely decreased function. The left ventricle demonstrates global hypokinesis. The left ventricular internal cavity size was mildly dilated. There is mild concentric left ventricular hypertrophy. Left ventricular diastolic function could not be evaluated.  2. Right ventricular systolic function is moderately reduced. The right ventricular size is moderately enlarged. There is moderately elevated pulmonary artery systolic pressure. The estimated right ventricular systolic pressure is 37.1 mmHg.  3. Left atrial size was severely dilated.  4. Right atrial size was moderately dilated.  5. The mitral valve is grossly normal. Mild to moderate mitral valve regurgitation.  6. Tricuspid valve regurgitation is moderate.  7. The aortic valve is tricuspid. There is moderate calcification of the aortic valve. There is mild thickening of the aortic valve. Aortic valve regurgitation is trivial. Mild to moderate aortic valve sclerosis/calcification is present, without any evidence of aortic stenosis.  8. The inferior vena cava is dilated in size with <50% respiratory variability, suggesting right atrial pressure of 15 mmHg. Comparison(s): The left  ventricular function is worsened. There is evidence of increased filling pressures, increased PA pressure and worse tricuspid insufficiency (all consistent with  worsened hypervolemia/heart failure exacerbation). FINDINGS  Left Ventricle: Left ventricular ejection fraction, by estimation, is 25 to 30%. Left ventricular ejection fraction by 3D volume is 22 %. The left ventricle has severely decreased function. The left ventricle demonstrates global hypokinesis. The left ventricular internal cavity size was mildly dilated. There is mild concentric left ventricular hypertrophy. Left ventricular diastolic function could not be evaluated due to atrial fibrillation. Left ventricular diastolic function could not be evaluated. Right Ventricle: The right ventricular size is moderately enlarged. No increase in right ventricular wall thickness. Right ventricular systolic function is moderately reduced. There is moderately elevated pulmonary artery systolic pressure. The tricuspid  regurgitant velocity is 3.18 m/s, and with an assumed right atrial pressure of 15 mmHg, the estimated right ventricular systolic pressure is 78.2 mmHg. Left Atrium: Left atrial size was severely dilated. Right Atrium: Right atrial size was moderately dilated. Pericardium: There is no evidence of pericardial effusion. Mitral Valve: The mitral valve is grossly normal. Mild to moderate mitral valve regurgitation, with centrally-directed jet. Tricuspid Valve: The tricuspid valve is normal in structure. Tricuspid valve regurgitation is moderate. Aortic Valve: The aortic valve is tricuspid. There is moderate calcification of the aortic valve. There is mild thickening of the aortic valve. Aortic valve regurgitation is trivial. Aortic regurgitation PHT measures 560 msec. Mild to moderate aortic valve sclerosis/calcification is present, without any evidence of aortic stenosis. Aortic valve mean gradient measures 3.0 mmHg. Aortic valve peak gradient measures  4.9 mmHg. Aortic valve area, by VTI measures 1.95 cm. Pulmonic Valve: The pulmonic valve was not well visualized. Pulmonic valve regurgitation is not visualized. Aorta: The aortic root and ascending aorta are structurally normal, with no evidence of dilitation. Venous: The inferior vena cava is dilated in size with less than 50% respiratory variability, suggesting right atrial pressure of 15 mmHg. IAS/Shunts: No atrial level shunt detected by color flow Doppler.  LEFT VENTRICLE PLAX 2D LVIDd:         5.70 cm LVIDs:         5.10 cm LV PW:         1.30 cm   3D Volume EF LV IVS:        1.40 cm   LV 3D EF:    Left ventricular LVOT diam:     2.10 cm                ejection fraction by LV SV:         31                     3D volume is 22 %. LV SV Index:   15 LVOT Area:     3.46 cm                          3D Volume EF:                          3D EF:        22 %                          LV EDV:       147 ml                          LV ESV:       115 ml  LV SV:        32 ml RIGHT VENTRICLE            IVC RV Basal diam:  5.40 cm    IVC diam: 2.90 cm RV Mid diam:    3.60 cm RV S prime:     5.52 cm/s LEFT ATRIUM              Index       RIGHT ATRIUM           Index LA diam:        5.00 cm  2.50 cm/m  RA Area:     35.20 cm LA Vol (A2C):   146.0 ml 72.96 ml/m RA Volume:   147.00 ml 73.46 ml/m LA Vol (A4C):   133.0 ml 66.46 ml/m LA Biplane Vol: 139.0 ml 69.46 ml/m  AORTIC VALVE AV Area (Vmax):    2.17 cm AV Area (Vmean):   1.88 cm AV Area (VTI):     1.95 cm AV Vmax:           111.00 cm/s AV Vmean:          78.340 cm/s AV VTI:            0.158 m AV Peak Grad:      4.9 mmHg AV Mean Grad:      3.0 mmHg LVOT Vmax:         69.58 cm/s LVOT Vmean:        42.460 cm/s LVOT VTI:          0.089 m LVOT/AV VTI ratio: 0.56 AI PHT:            560 msec  AORTA Ao Root diam: 3.20 cm Ao Asc diam:  3.60 cm MR Peak grad:    111.5 mmHg  TRICUSPID VALVE MR Mean grad:    72.0 mmHg   TR Peak grad:   40.4 mmHg MR Vmax:          528.00 cm/s TR Vmax:        318.00 cm/s MR Vmean:        403.0 cm/s MR PISA:         3.08 cm    SHUNTS MR PISA Eff ROA: 23 mm      Systemic VTI:  0.09 m MR PISA Radius:  0.70 cm     Systemic Diam: 2.10 cm Dani Gobble Croitoru MD Electronically signed by Sanda Klein MD Signature Date/Time: 12/08/2020/5:09:03 PM    Final     Cardiac Studies   Echocardiogram 12/08/20: 1. Left ventricular ejection fraction, by estimation, is 25 to 30%. Left  ventricular ejection fraction by 3D volume is 22 %. The left ventricle has  severely decreased function. The left ventricle demonstrates global  hypokinesis. The left ventricular  internal cavity size was mildly dilated. There is mild concentric left  ventricular hypertrophy. Left ventricular diastolic function could not be  evaluated.  2. Right ventricular systolic function is moderately reduced. The right  ventricular size is moderately enlarged. There is moderately elevated  pulmonary artery systolic pressure. The estimated right ventricular  systolic pressure is 123XX123 mmHg.  3. Left atrial size was severely dilated.  4. Right atrial size was moderately dilated.  5. The mitral valve is grossly normal. Mild to moderate mitral valve  regurgitation.  6. Tricuspid valve regurgitation is moderate.  7. The aortic valve is tricuspid. There is moderate calcification of the  aortic valve. There is mild thickening of the aortic  valve. Aortic valve  regurgitation is trivial. Mild to moderate aortic valve  sclerosis/calcification is present, without any  evidence of aortic stenosis.  8. The inferior vena cava is dilated in size with <50% respiratory  variability, suggesting right atrial pressure of 15 mmHg.   Comparison(s): The left ventricular function is worsened. There is  evidence of increased filling pressures, increased PA pressure and worse  tricuspid insufficiency (all consistent with worsened hypervolemia/heart  failure exacerbation).    Echo 06/27/20: 1. Global hypokinesis with akinesis of the inferior and inferolateral  wall.  2. Left ventricular ejection fraction, by estimation, is 30 to 35%. The  left ventricle has moderate to severely decreased function. The left  ventricle demonstrates regional wall motion abnormalities (see scoring  diagram/findings for description). There  is moderate left ventricular hypertrophy. Left ventricular diastolic  function could not be evaluated.  3. Right ventricular systolic function is normal. The right ventricular  size is normal. There is normal pulmonary artery systolic pressure.  4. Left atrial size was severely dilated.  5. Right atrial size was moderately dilated.  6. The mitral valve is normal in structure. Mild mitral valve  regurgitation. No evidence of mitral stenosis.  7. The aortic valve is tricuspid. Aortic valve regurgitation is trivial.  Mild to moderate aortic valve sclerosis/calcification is present, without  any evidence of aortic stenosis.  8. The inferior vena cava is dilated in size with >50% respiratory  variability, suggesting right atrial pressure of 8 mmHg.   Patient Profile     75 y.o. female with a PMH of CAD s/p CABG, CHF, HTN, HLD, PAF with noncompliance, DM2, COPD, GERD, COPD, CKD stage III who is being followed by cardiology for the evaluation of Afib and CHF   Assessment & Plan    1. Acute on chronic combined CHF: patient presented with generalized weakness, nausea, and vomiting x1. She was hypoxic to the 70s-80s on arrival requiring O2 via [place]. Echo this admission shows mild decline in EF from 30-35% 06/2020 to 25-30% this admission with global hypokinesis, moderately reduced RV systolic function, moderately elevated PA pressures with RVSP 55.4 mmHg, severe LAE, moderate RAE, mild-moderate MR, and moderate TR. She was given IV lasix 60mg  x1 dose yesterday.  UOP net +350 mL this admission with 1 unmeasured UOP occurrence. Weight is stable at  195lbs. She was not felt to be significantly volume overloaded on Dr. Kyla Balzarine evaluation yesterday and she was transitioned to home po torsemide 40mg  daily. Respiratory failure was felt to be multifactorial in the setting of COPD with chronic respiratory failure. She appears euvolemic today. Suspect persistent atrial fibrillation may be contributing to further decline in EF.  - Continue torsemide po 40mg  daily - Continue metoprolol succinate, losartan, and digoxin. Has not tolerated entresto in the past - Could consider addition of spironolactone if Cr remains stable - She is scheduled to re-establish care with the Adv HF clinic 12/21/20  2. Persistent atrial fibrillation: Continues to have intermittently elevated HR up to 120s. Recent 1 week monitor 10/2020 showed 100% Afib burden with SVR, CVR, and RVR. She missed her appointment with Dr. Lovena Le 11/26/20 to discuss possible ablation +/- PPM placement. Patient thinks she may have missed a dose of xarelto in the past few weeks when she overslept. Suspect poorly controlled atrial fibrillation has contributed to #1.  - Will reach out to EP team to see this admission to determine a plan given poor outpatient compliance.  - Continue metoprolol succinate and digoxin for rate  control - Continue xarelto for stroke ppx  3. COPD/Chronic respiratory failure: suspect this likely contributed to her presentation this admission. She was given IV steroids and transitioned to po prednisone for possible exacerbation  - Continue supportive care per primary team  4. CAD s/p CABG in 2016: no anginal complaints. HsTrop with low flat trend not c/w ACS. EKG non-ischemic. Not on aspirin given need for anticoagulation - Continue statin - Continue BBlocker        For questions or updates, please contact [facility] Please consult [email or web address] for contact info under        Signed, Abigail Butts, PA-C  12/09/2020, 10:35 AM    Personally seen and examined.  Agree with above.   Witnessed her ambulating the hallway with physical therapy team.  Excellent.  Breathing has improved with IV Lasix.  Now back on torsemide 40 mg a day.  Compliance has been an issue especially in the outpatient setting.  She has missed a few appointments. We will have electrophysiology team see her while she is here since she missed her previous visit with Dr. Lovena Le to discuss other potential options for her difficult to control atrial fibrillation.  I would like to get a plan in place for her.  Echo EF 25 to 30% mildly reduced from previously 30 to 35%.  [name], MD

## 2020-12-09 NOTE — Progress Notes (Signed)
Heart Failure Stewardship Pharmacist Progress Note   PCP: [name], MD PCP-Cardiologist: [name], MD    HPI:  [name] [name]. [place] is a 75 yo female with a past medical history of persistent Afib, HTN, HLD, HFrEF due to ischemic cardiomyopathy, CAD s/p CABG (2016), CKD stage III, COPD, GERD, and gout.  Patient had cardiac arrest in 10/2014 and underwent CABG (LIMA -> LAD, VG -> OM), EF was 35-40%. EF has fluctuated over the years with EF 50-55% in 05/2016, EF 30-35% in 10/2017, EF 60-65% in 09/2019, and EF 30-35% in 06/2020. Patient had hospitalization in 07/2020 and recent ED visit on 11/15/20 for Afib with RVR and worsening SOB. She has been on metoprolol succinate, losartan, digoxin, and torsemide at home.   Presented to ED again on 12/08/20 with generalized weakness, N/V, and SOB. Systolic BP was low at 80 and she was given 300 mL normal saline bolus. Reported that someone told her double her torsemide, which resulted in weakness. Was hypoxic on arrival with SpO2 70-80s requiring [place] O2. Received additional bolus with 500 mL NS but felt to be fluid overloaded. Was given 60 mg IV furosemide. Now off IV diuretics and back on home torsemide 40 mg daily. BP has also improved and losartan restarted. Echo on 12/08/20 showed EF 20-25% with global hypokinesis and moderately reduced RV.   Current HF Medications: Metoprolol succinate 100 mg BID Losartan 25 mg daily Digoxin 0.0625 mg daily Torsemide 40 mg daily  Prior to admission HF Medications: Metoprolol succinate 100 mg BID Losartan 25 mg daily (marked as not taking) Digoxin 0.0625 mg daily Torsemide 40 mg daily  Pertinent Lab Values: . Serum creatinine 1.36, BUN 34, Potassium 3.9, Sodium 142, BNP 1,065.4 (12/08/20), Magnesium 2.0, Digoxin 0.6 (12/08/20)  Vital Signs: . Weight: 195 lbs (admission weight: 192 lbs) . Blood pressure: 117/98 . Heart rate: 63  Medication Assistance / Insurance Benefits Check: Does the patient have prescription  insurance?  Yes Type of insurance plan: [employer]  Does the patient qualify for medication assistance through manufacturers or grants?   Yes . Eligible grants and/or patient assistance programs: [place], [place] . Medication assistance applications in progress: none  . Medication assistance applications approved: none Approved medication assistance renewals will be completed by: HF Clinic  Outpatient Pharmacy:  Prior to admission outpatient pharmacy: [facility] Is the patient willing to use [facility] at discharge? Pending Is the patient willing to transition their outpatient pharmacy to utilize a [facility] outpatient pharmacy?   Pending    Assessment:  1. Acute on chronic systolic CHF (EF 47-42%), due to ischemic cardiomyopathy. NYHA class II-III symptoms. - Low O2 sat possibly secondary to being fluid overloaded, also has COPD - Trace lower extremity edema  - BNP elevated at 1065.4 - Scr stable at 1.36 - Attempted to speak with patient today to clarify previous intolerance to [facility] and to determine if she had been taking losartan, spironolactone, and PO diuretic at home prior to admission. However, patient is poor historian and does not know the names of medications she was on. Losartan and spironolactone bottles are not in her bag of home meds, but torsemide is. Patient expressed willingness to retrial Entresto at a low dose.  Plan: 1) Medication changes recommended at this time: - Continue torsemide 40 mg daily - Continue metoprolol succinate 100 mg BID - Recommend switching losartan 25 mg daily to Entresto 24/26 mg BID. Monitor BP, Scr, and K. - Consider re-starting spironolactone 12.5 mg  next if BP and kidney function stable - Continue digoxin 0.0625 mg daily  2) Patient assistance: - Patient has [employer] Medicare and may be eligible for patient assistance for Entresto and SGLT2i  3)  Education  - To be completed prior to  discharge  Kerby Nora, PharmD, BCPS Heart Failure Stewardship Pharmacist Phone [phone]

## 2020-12-09 NOTE — Discharge Summary (Signed)
Physician Discharge Summary  [name] [name] [id number] DOB: 03/26/1946 DOA: 12/08/2020  PCP: [name], MD  Admit date: 12/08/2020 Discharge date: 12/09/2020  Time spent: 45 minutes  Recommendations for Outpatient Follow-up:  Patient will be discharged to home.  Patient will need to follow up with primary care provider within one week of discharge, repeat CBC and BMP.  Follow-up with cardiology.  Patient should continue medications as prescribed.  Patient should follow a heart healthy/carb modified diet.   Discharge Diagnoses:  Acute respiratory failure with hypoxia Acute on chronic systolic CHF exacerbation Persistent atrial fibrillation Possible COPD exacerbation Generalized weakness and lethargy Elevated troponin/CAD Transient hypotension Essential hypertension Hypoalbuminemia Chronic kidney disease, stage IIIb Diabetes mellitus, type II Chronic thrombocytopenia GERD Obesity  Discharge Condition: Stable  Diet recommendation: Heart healthy/Carb modified  Filed Weights   12/08/20 0908 12/08/20 1215 12/09/20 0350  Weight: 87.1 kg 88.5 kg 88.6 kg    History of present illness:  On 4/26 2022 by Dr. Fuller Plan [name] [name] is a 75 y.o. female with medical history significant of persistent A. fib, HTN, HLD, CHF with EF 30 to 35%, CAD s/p CABG in 2016, cardiac arrest, CKD stage III, COPD, GERD, and gout who presents with pain weakness.  History is somewhat difficult to obtain from the patient.  Notes that she has not been feeling well for 3 weeks.  Patient denies feeling short of breath, orthopnea, or significant lower extremity swelling.  She reports that she is taking her medications as advised.  She says someone called yesterday and advised her to to double up on furosemide possibly, but it is not totally clear.  She does not seem to know the names of medications that she is taking.  However, reports after being advised to double up on the medication she had been more  weak.  Pharmacy makes note that she had recently filled a new prescription for furosemide 20 mg twice daily and tizanidine 2 mg nightly.  Noted associated symptoms of  palpitations, nausea, and 1 episode of gagging prior to EMS arrival.  Patient notes that she has someone that stays with her overnight.  Denies any recent falls, focal weakness, chest pain, cough, dysuria, or fever.  She had recently had a Zio monitor performed in 10/2020 showing A. fib with slow VR/controlled VR/ RVR, frequent PVCs, and NSVT.Last seen in the emergency department on 4/3 for complaints of chest pain and shortness of breath.  She was noted to be in A. fib with RVR and hypoxic down to 85% with improvement on 2 L nasal cannula oxygen.  She was treated with steroids, albuterol, and given 40 mg of Lasix IV after talks with [name] of cardiology.  Patient was able to be weaned off of oxygen and discharged home.  It appears that he had previously recommended ablation, but patient declined previously.   In route with EMS patient was seen to be in atrial fibrillation and hypotensive with systolic blood pressures in 80s.  She was given 300 mL of normal saline IV fluid bolus.  Hospital Course:  Acute respiratory failure with hypoxia -Patient presented with oxygen saturations in the 80s on room air and was placed on 4 L of nasal cannula -Suspect secondary to CHF exacerbation -Patient has since been transitioned to room air and is maintaining oxygen saturations in the 90s.  She did have a mild drop while ambulating to 88% however this rebounded. -Patient appears to have improved quicker than expected  Acute on chronic  systolic CHF exacerbation -EF 30 to 35% in November 2021 -Unclear if patient is compliant with medication regimen -Echocardiogram EF 25 to 30% -Cardiology consulted and appreciated -Patient was given 1 dose of IV Lasix in the emergency department, 60 mg -Continue torsemide 40 mg daily, metoprolol, losartan,  digoxin.  Patient has not tolerated Entresto in the past -May consider addition of spironolactone if creatinine remains stable -Patient to follow-up with heart failure clinic on 12/21/2020 -Patient improved quicker than expected  Persistent atrial fibrillation -Patient is post to be on chronic anticoagulation-Xarelto, however unsure if patient has missed any doses -Electrophysiology consulted and appreciated- recommended that patient be more compliant with medications and follow up appointments. No further work up at this time.  -CHA2DS2-VASc 6 -Continue metoprolol and digoxin  Possible COPD exacerbation -Patient was given 1 dose of IV Solu-Medrol 125 mg -Continue oral prednisone, Spiriva -Lung sounds do appear to be improving  Generalized weakness and lethargy -Patient was noted to be somewhat lethargic upon arrival to the hospital -She had recently been started on new medication tizanidine -Currently patient is alert oriented x3 -PT and OT worked with patient, no further needs  Elevated troponin/CAD -High-sensitivity troponin 20-25 -Not consistent with ACS -Currently denies chest pain -Patient with CABG back in 2016 -Cardiology consulted and appreciated  Transient hypotension -Resolved, patient was noted to have systolic blood pressure in the 80s.-She was given IV fluids  Essential hypertension -Continue metoprolol, spironolactone, losartan  Hypoalbuminemia -Continue supplementation  Chronic kidney disease, stage IIIb -Creatinine appears to be stable  Diabetes mellitus, type II -Hemoglobin A1c 7 -Metformin was held but may be resumed on discharge  Chronic thrombocytopenia -Currently 127- repeat in one week  GERD -Continue PPI  Obesity -BMI of 30 -Follow-up with PCP to discuss lifestyle modifications  Procedures: Echocardiogram  Consultations: Cardiology and electrophysiology  Discharge Exam: Vitals:   12/09/20 1616 12/09/20 1619  BP: 126/79 (!) 130/91   Pulse: 99 74  Resp: 20   Temp: (!) 97.5 F (36.4 C)   SpO2: 95% 100%     General: Well developed, chronically ill-appearing, elderly, NAD  HEENT: NCAT,  mucous membranes moist.  Cardiovascular: S1 S2 auscultated, irregular, Soft SEM  Respiratory: Clear to auscultation bilaterally with equal chest rise, no wheezing  Abdomen: Soft, nontender, nondistended, + bowel sounds  Extremities: warm dry without cyanosis clubbing or edema  Neuro: AAOx3, nonfocal  Psych: appropriate mood and affect  Discharge Instructions Discharge Instructions    (HEART FAILURE PATIENTS) Call MD:  Anytime you have any of the following symptoms: 1) 3 pound weight gain in 24 hours or 5 pounds in 1 week 2) shortness of breath, with or without a dry hacking cough 3) swelling in the hands, feet or stomach 4) if you have to sleep on extra pillows at night in order to breathe.   Complete by: As directed    Discharge instructions   Complete by: As directed    Patient will be discharged to home.  Patient will need to follow up with primary care provider within one week of discharge, repeat CBC and BMP.  Follow-up with cardiology.  Patient should continue medications as prescribed.  Patient should follow a heart healthy/carb modified diet.   Increase activity slowly   Complete by: As directed      Allergies as of 12/09/2020      Reactions   Bee Venom Anaphylaxis   Ivp Dye [iodinated Diagnostic Agents] Anaphylaxis   Codeine Nausea And Vomiting   Entresto [sacubitril-valsartan] Other (  See Comments)   Chest pain    Shrimp [shellfish Allergy] Swelling   Jardiance [empagliflozin] Other (See Comments)   Caused Boils      Medication List    TAKE these medications   acetaminophen 325 MG tablet Commonly known as: TYLENOL Take 2 tablets (650 mg total) by mouth every 4 (four) hours as needed for headache or mild pain. What changed:   medication strength  how much to take  when to take this  reasons to  take this   albuterol 108 (90 Base) MCG/ACT inhaler Commonly known as: VENTOLIN HFA Inhale 1-2 puffs into the lungs every 6 (six) hours as needed for wheezing or shortness of breath.   allopurinol 300 MG tablet Commonly known as: ZYLOPRIM Take 1 tablet (300 mg total) by mouth daily.   atorvastatin 10 MG tablet Commonly known as: LIPITOR Take 10 mg by mouth at bedtime.   digoxin 0.125 MG tablet Commonly known as: LANOXIN Take 0.5 tablets (0.0625 mg total) by mouth daily.   losartan 25 MG tablet Commonly known as: COZAAR Take 1 tablet (25 mg total) by mouth daily.   metFORMIN 500 MG tablet Commonly known as: GLUCOPHAGE Take 500 mg by mouth daily. Daily with a meal   metoprolol succinate 100 MG 24 hr tablet Commonly known as: TOPROL-XL Take 1 tablet (100 mg total) by mouth 2 (two) times daily.   pantoprazole 40 MG tablet Commonly known as: PROTONIX Take 40 mg by mouth in the morning.   predniSONE 20 MG tablet Commonly known as: DELTASONE Take 2 tablets (40 mg total) by mouth daily with breakfast for 4 days. Start taking on: December 10, 2020   rivaroxaban 20 MG Tabs tablet Commonly known as: XARELTO Take 1 tablet (20 mg total) by mouth daily with supper. Resume from 11/01/2019   Spiriva Respimat 2.5 MCG/ACT Aers Generic drug: Tiotropium Bromide Monohydrate Inhale 2 puffs into the lungs daily.   torsemide 20 MG tablet Commonly known as: DEMADEX Take 2 tablets (40 mg total) by mouth daily.            Durable Medical Equipment  (From admission, onward)         Start     Ordered   12/09/20 1241  For home use only DME Cane  Once        12/09/20 1240         Allergies  Allergen Reactions  . Bee Venom Anaphylaxis  . Ivp Dye [Iodinated Diagnostic Agents] Anaphylaxis  . Codeine Nausea And Vomiting  . Entresto [Sacubitril-Valsartan] Other (See Comments)    Chest pain   . Shrimp [Shellfish Allergy] Swelling  . Jardiance [Empagliflozin] Other (See Comments)     Caused Boils     Follow-up Information     [facility] Follow up on 12/21/2020.   Specialty: Cardiology Why: Please arrive 15 minutes early for your 9:30am post-hospital cardiology appointment Contact information: [address] [phone] [place]       [name], MD On 12/21/2020.   Specialty: Internal Medicine Why: @3 ;30pm Contact information: [address] [address] [phone]        Evans Lance, MD Follow up.   Specialty: Cardiology Why: 12/31/2020 @ 11:00AM Contact information: [address] [place] [address] [phone]        [name], MD .   Specialty: Internal Medicine Contact information: [address],  [address] [place] [address] [phone]        Jerline Pain, MD .   Specialty: Cardiology Contact information: [phone] [address] [place] [address] [phone]                The results of significant diagnostics from this hospitalization (including imaging, microbiology, ancillary and laboratory) are listed below for reference.    Significant Diagnostic Studies: DG Chest Port 1 View  Result Date: 12/08/2020 CLINICAL DATA:  Questionable sepsis. EXAM: PORTABLE CHEST 1 VIEW COMPARISON:  11/15/2020.  06/26/2020.  03/23/2018. FINDINGS: Prior CABG. Cardiomegaly. No pulmonary venous congestion. Very mild bilateral interstitial prominence. Very mild interstitial edema and/or pneumonitis cannot be excluded. Mild left base subsegmental atelectasis and or scarring. No pleural effusion or pneumothorax. Degenerative change thoracic spine. IMPRESSION: 1. Prior CABG. Cardiomegaly. Very mild bilateral interstitial prominence. Very mild interstitial edema and/or pneumonitis cannot be excluded. 2. Mild left base subsegmental atelectasis and or scarring Electronically Signed    By: [place]   On: 12/08/2020 09:13   DG Chest Port 1 View  Result Date: 11/15/2020 CLINICAL DATA:  Dyspnea and chest pressure worsening for weeks EXAM: PORTABLE CHEST 1 VIEW COMPARISON:  08/13/2020 chest radiograph. FINDINGS: Intact sternotomy wires. Stable cardiomediastinal silhouette with moderate cardiomegaly. No pneumothorax. No pleural effusion. No overt pulmonary edema. Mild platelike scarring versus atelectasis in the left mid lung. Mild right basilar atelectasis. IMPRESSION: 1. Stable moderate cardiomegaly without overt pulmonary edema. 2. Mild platelike scarring versus atelectasis in the left mid lung. 3. Mild right basilar atelectasis. Electronically Signed   By: Ilona Sorrel M.D.   On: 11/15/2020 10:24   ECHOCARDIOGRAM COMPLETE  Result Date: 12/08/2020    ECHOCARDIOGRAM REPORT   Patient Name:   [name] [name] Date of Exam: 12/08/2020 Medical Rec #:  [id number]     Height:       67.0 in Accession #:    [id number]    Weight:       195.1 lb Date of Birth:  1946-02-26     BSA:          2.001 m Patient Age:    75 years      BP:           111/72 mmHg Patient Gender: F             HR:           91 bpm. Exam Location:  Inpatient Procedure: 2D Echo, Cardiac Doppler and Color Doppler Indications:    CHF-Acute Systolic  History:        Patient has prior history of Echocardiogram examinations.                 Cardiomyopathy, CAD, COPD; Arrythmias:Atrial Fibrillation.                 Carotid disease. CKD. GERD. Prior CABGx2 with LA appendage                 clipping.  Sonographer:    Clayton Lefort RDCS (AE) Referring Phys: [id number] RONDELL A SMITH IMPRESSIONS  1. Left ventricular ejection fraction, by estimation, is 25 to 30%. Left ventricular ejection fraction by 3D volume is 22 %. The left ventricle has severely decreased function. The left ventricle demonstrates global hypokinesis. The left ventricular internal cavity size was mildly dilated. There is mild concentric left ventricular hypertrophy. Left  ventricular diastolic function could not be evaluated.  2. Right ventricular systolic function is  moderately reduced. The right ventricular size is moderately enlarged. There is moderately elevated pulmonary artery systolic pressure. The estimated right ventricular systolic pressure is 58.5 mmHg.  3. Left atrial size was severely dilated.  4. Right atrial size was moderately dilated.  5. The mitral valve is grossly normal. Mild to moderate mitral valve regurgitation.  6. Tricuspid valve regurgitation is moderate.  7. The aortic valve is tricuspid. There is moderate calcification of the aortic valve. There is mild thickening of the aortic valve. Aortic valve regurgitation is trivial. Mild to moderate aortic valve sclerosis/calcification is present, without any evidence of aortic stenosis.  8. The inferior vena cava is dilated in size with <50% respiratory variability, suggesting right atrial pressure of 15 mmHg. Comparison(s): The left ventricular function is worsened. There is evidence of increased filling pressures, increased PA pressure and worse tricuspid insufficiency (all consistent with worsened hypervolemia/heart failure exacerbation). FINDINGS  Left Ventricle: Left ventricular ejection fraction, by estimation, is 25 to 30%. Left ventricular ejection fraction by 3D volume is 22 %. The left ventricle has severely decreased function. The left ventricle demonstrates global hypokinesis. The left ventricular internal cavity size was mildly dilated. There is mild concentric left ventricular hypertrophy. Left ventricular diastolic function could not be evaluated due to atrial fibrillation. Left ventricular diastolic function could not be evaluated. Right Ventricle: The right ventricular size is moderately enlarged. No increase in right ventricular wall thickness. Right ventricular systolic function is moderately reduced. There is moderately elevated pulmonary artery systolic pressure. The tricuspid  regurgitant  velocity is 3.18 m/s, and with an assumed right atrial pressure of 15 mmHg, the estimated right ventricular systolic pressure is 27.7 mmHg. Left Atrium: Left atrial size was severely dilated. Right Atrium: Right atrial size was moderately dilated. Pericardium: There is no evidence of pericardial effusion. Mitral Valve: The mitral valve is grossly normal. Mild to moderate mitral valve regurgitation, with centrally-directed jet. Tricuspid Valve: The tricuspid valve is normal in structure. Tricuspid valve regurgitation is moderate. Aortic Valve: The aortic valve is tricuspid. There is moderate calcification of the aortic valve. There is mild thickening of the aortic valve. Aortic valve regurgitation is trivial. Aortic regurgitation PHT measures 560 msec. Mild to moderate aortic valve sclerosis/calcification is present, without any evidence of aortic stenosis. Aortic valve mean gradient measures 3.0 mmHg. Aortic valve peak gradient measures 4.9 mmHg. Aortic valve area, by VTI measures 1.95 cm. Pulmonic Valve: The pulmonic valve was not well visualized. Pulmonic valve regurgitation is not visualized. Aorta: The aortic root and ascending aorta are structurally normal, with no evidence of dilitation. Venous: The inferior vena cava is dilated in size with less than 50% respiratory variability, suggesting right atrial pressure of 15 mmHg. IAS/Shunts: No atrial level shunt detected by color flow Doppler.  LEFT VENTRICLE PLAX 2D LVIDd:         5.70 cm LVIDs:         5.10 cm LV PW:         1.30 cm   3D Volume EF LV IVS:        1.40 cm   LV 3D EF:    Left ventricular LVOT diam:     2.10 cm                ejection fraction by LV SV:         31                     3D volume is 22 %.  LV SV Index:   15 LVOT Area:     3.46 cm                          3D Volume EF:                          3D EF:        22 %                          LV EDV:       147 ml                          LV ESV:       115 ml                          LV SV:         32 ml RIGHT VENTRICLE            IVC RV Basal diam:  5.40 cm    IVC diam: 2.90 cm RV Mid diam:    3.60 cm RV S prime:     5.52 cm/s LEFT ATRIUM              Index       RIGHT ATRIUM           Index LA diam:        5.00 cm  2.50 cm/m  RA Area:     35.20 cm LA Vol (A2C):   146.0 ml 72.96 ml/m RA Volume:   147.00 ml 73.46 ml/m LA Vol (A4C):   133.0 ml 66.46 ml/m LA Biplane Vol: 139.0 ml 69.46 ml/m  AORTIC VALVE AV Area (Vmax):    2.17 cm AV Area (Vmean):   1.88 cm AV Area (VTI):     1.95 cm AV Vmax:           111.00 cm/s AV Vmean:          78.340 cm/s AV VTI:            0.158 m AV Peak Grad:      4.9 mmHg AV Mean Grad:      3.0 mmHg LVOT Vmax:         69.58 cm/s LVOT Vmean:        42.460 cm/s LVOT VTI:          0.089 m LVOT/AV VTI ratio: 0.56 AI PHT:            560 msec  AORTA Ao Root diam: 3.20 cm Ao Asc diam:  3.60 cm MR Peak grad:    111.5 mmHg  TRICUSPID VALVE MR Mean grad:    72.0 mmHg   TR Peak grad:   40.4 mmHg MR Vmax:         528.00 cm/s TR Vmax:        318.00 cm/s MR Vmean:        403.0 cm/s MR PISA:         3.08 cm    SHUNTS MR PISA Eff ROA: 23 mm      Systemic VTI:  0.09 m MR PISA Radius:  0.70 cm     Systemic Diam: 2.10 cm Dani Gobble Croitoru MD Electronically signed by Sanda Klein MD Signature Date/Time: 12/08/2020/5:09:03 PM  Final     Microbiology: Recent Results (from the past 240 hour(s))  Blood culture (routine single)     Status: None (Preliminary result)   Collection Time: 12/08/20  8:30 AM   Specimen: BLOOD  Result Value Ref Range Status   Specimen Description BLOOD SITE NOT SPECIFIED  Final   Special Requests   Final    BOTTLES DRAWN AEROBIC AND ANAEROBIC Blood Culture adequate volume   Culture   Final    NO GROWTH < 24 HOURS Performed at [facility], [address] [place], [place] [address]    Report Status PENDING  Incomplete  Urine culture     Status: None   Collection Time: 12/08/20  8:30 AM   Specimen: In/Out Cath Urine  Result Value Ref Range  Status   Specimen Description IN/OUT CATH URINE  Final   Special Requests NONE  Final   Culture   Final    NO GROWTH Performed at [facility], [place] [address], [place], [place] [address]    Report Status 12/09/2020 FINAL  Final  Resp Panel by RT-PCR (Flu A&B, Covid) Nasopharyngeal Swab     Status: None   Collection Time: 12/08/20  8:30 AM   Specimen: Nasopharyngeal Swab; Nasopharyngeal(NP) swabs in vial transport medium  Result Value Ref Range Status   SARS Coronavirus 2 by RT PCR NEGATIVE NEGATIVE Final    Comment: (NOTE) SARS-CoV-2 target nucleic acids are NOT DETECTED.  The SARS-CoV-2 RNA is generally detectable in upper respiratory specimens during the acute phase of infection. The lowest concentration of SARS-CoV-2 viral copies this assay can detect is 138 copies/mL. A negative result does not preclude SARS-Cov-2 infection and should not be used as the sole basis for treatment or other patient management decisions. A negative result may occur with  improper specimen collection/handling, submission of specimen other than nasopharyngeal swab, presence of viral mutation(s) within the areas targeted by this assay, and inadequate number of viral copies(<138 copies/mL). A negative result must be combined with clinical observations, patient history, and epidemiological information. The expected result is Negative.  Fact Sheet for Patients:  EntrepreneurPulse.com.au  Fact Sheet for Healthcare Providers:  IncredibleEmployment.be  This test is no t yet approved or cleared by the [place] FDA and  has been authorized for detection and/or diagnosis of SARS-CoV-2 by FDA under an Emergency Use Authorization (EUA). This EUA will remain  in effect (meaning this test can be used) for the duration of the COVID-19 declaration under Section 564(b)(1) of the Act, 21 U.S.C.section 360bbb-3(b)(1), unless the authorization is terminated  or revoked  sooner.       Influenza A by PCR NEGATIVE NEGATIVE Final   Influenza B by PCR NEGATIVE NEGATIVE Final    Comment: (NOTE) The Xpert Xpress SARS-CoV-2/FLU/RSV plus assay is intended as an aid in the diagnosis of influenza from Nasopharyngeal swab specimens and should not be used as a sole basis for treatment. Nasal washings and aspirates are unacceptable for Xpert Xpress SARS-CoV-2/FLU/RSV testing.  Fact Sheet for Patients: EntrepreneurPulse.com.au  Fact Sheet for Healthcare Providers: IncredibleEmployment.be  This test is not yet approved or cleared by the [place] FDA and has been authorized for detection and/or diagnosis of SARS-CoV-2 by FDA under an Emergency Use Authorization (EUA). This EUA will remain in effect (meaning this test can be used) for the duration of the COVID-19 declaration under Section 564(b)(1) of the Act, 21 U.S.C. section 360bbb-3(b)(1), unless the authorization is terminated or revoked.  Performed  at [facility], [place] [address] [place], [place] [address]   Culture, blood (single)     Status: None (Preliminary result)   Collection Time: 12/08/20  8:49 AM   Specimen: BLOOD  Result Value Ref Range Status   Specimen Description BLOOD SITE NOT SPECIFIED  Final   Special Requests   Final    BOTTLES DRAWN AEROBIC AND ANAEROBIC Blood Culture adequate volume   Culture   Final    NO GROWTH < 24 HOURS Performed at [facility], [place] [address], [place], [place] [address]    Report Status PENDING  Incomplete     Labs: Basic Metabolic Panel: Recent Labs  Lab 12/08/20 0936 12/08/20 1313 12/09/20 0635  NA 141  --  142  K 4.4  --  3.9  CL 110  --  109  CO2 19*  --  21*  GLUCOSE 126*  --  165*  BUN 31*  --  34*  CREATININE 1.47*  --  1.36*  CALCIUM 8.8*  --  9.0  MG  --  2.0  --    Liver Function Tests: Recent Labs  Lab 12/08/20 0936  AST 25  ALT 14  ALKPHOS 120  BILITOT 2.0*  PROT 5.5*   ALBUMIN 2.7*   No results for input(s): LIPASE, AMYLASE in the last 168 hours. Recent Labs  Lab 12/08/20 1530  AMMONIA 42*   CBC: Recent Labs  Lab 12/08/20 0830  WBC 6.9  NEUTROABS 5.2  HGB 12.9  HCT 42.9  MCV 87.7  PLT 127*   Cardiac Enzymes: No results for input(s): CKTOTAL, CKMB, CKMBINDEX, TROPONINI in the last 168 hours. BNP: BNP (last 3 results) Recent Labs    08/13/20 1757 11/15/20 0951 12/08/20 0830  BNP 864.3* 658.7* 1,065.4*    ProBNP (last 3 results) No results for input(s): PROBNP in the last 8760 hours.  CBG: Recent Labs  Lab 12/08/20 1625 12/08/20 2121 12/09/20 0617 12/09/20 1200 12/09/20 1626  GLUCAP 173* 329* 159* 153* 139*       Signed:  Shalon Councilman  [facility] 12/09/2020, 6:45 PM

## 2020-12-09 NOTE — Evaluation (Signed)
Physical Therapy Evaluation Patient Details Name: [name] [name] MRN: [id number] DOB: 09/27/1945 Today's Date: 12/09/2020   History of Present Illness  75 y.o. female presents to [facility] ED on 12/08/2020 with reports of pain and weakness. Pt found to be in Afib and hypotensive en route to ED. Pt also found to be hypoxic in ED. Pt admitted for hypoxia and CHF management. PMH includes persistent A. fib, HTN, HLD, CHF with EF 30 to 35%, CAD s/p CABG in 2016, cardiac arrest, CKD stage III, COPD, GERD, and gout.  Clinical Impression  Pt demonstrates ability to ambulate community distances on RA with oxygen sats fluctuating between 88-97%. Pt demonstrates mild balance deficits and veers to the right and left during gait. Pt requires standing rest break during gait due to SOB. Pt may benefit from stair assessment at next session. Pt will benefit from acute PT to address deficits in overall strength, endurance, activity tolerance, and to improve WOB for safe and independent mobility.      Follow Up Recommendations No PT follow up    Equipment Recommendations  None recommended by PT    Recommendations for Other Services       Precautions / Restrictions Precautions Precautions: Fall Restrictions Weight Bearing Restrictions: No      Mobility  Bed Mobility Overal bed mobility: Modified Independent (HOB elevated)                  Transfers Overall transfer level: Independent Equipment used: None                Ambulation/Gait Ambulation/Gait assistance: Min guard Gait Distance (Feet): 500 Feet (2 trials, 500 feet each.) Assistive device: None Gait Pattern/deviations: Step-through pattern;Drifts right/left;Wide base of support Gait velocity: WNL Gait velocity interpretation: >2.62 ft/sec, indicative of community ambulatory General Gait Details: Pt on RA at start of session, oxygen sats at 97% at rest. Oxygen sats during ambulation fluctuate between 88-97% on RA. Pt placed on 2L  oxgyen with sats at 92%.  Pt required 2 standing rest breaks during gait d/t reports of SOB, oxygen sats stable.  Stairs            Wheelchair Mobility    Modified Rankin (Stroke Patients Only)       Balance Overall balance assessment: Mild deficits observed, not formally tested                                           Pertinent Vitals/Pain Pain Assessment: Faces Faces Pain Scale: Hurts a little bit Pain Location: L lateral trunk Pain Descriptors / Indicators: Discomfort;Grimacing Pain Intervention(s): Monitored during session    Home Living Family/patient expects to be discharged to:: Private residence Living Arrangements: Alone Available Help at Discharge: Family;Friend(s);Available PRN/intermittently Type of Home: House Home Access: Stairs to enter Entrance Stairs-Rails: Can reach both Entrance Stairs-Number of Steps: 3 Home Layout: One level Home Equipment: Cane - single point      Prior Function Level of Independence: Independent with assistive device(s)         Comments: Pt reports primarily using cane with stair negotiation.     Hand Dominance        Extremity/Trunk Assessment   Upper Extremity Assessment Upper Extremity Assessment: Defer to OT evaluation    Lower Extremity Assessment Lower Extremity Assessment: Generalized weakness    Cervical / Trunk Assessment Cervical / Trunk Assessment: Normal  Communication   Communication: No difficulties  Cognition Arousal/Alertness: Awake/alert Behavior During Therapy: WFL for tasks assessed/performed Overall Cognitive Status: No family/caregiver present to determine baseline cognitive functioning (Pt demonstrates confusion/difficulty understanding that 97% oxygen sat level is closer to 100% rather than 88%.)                                        General Comments General comments (skin integrity, edema, etc.): Pt reports feeling fine at start of session. Pt  demonstrates confusion regarding oxygen sat levels and indications for oxygen necessity.    Exercises     Assessment/Plan    PT Assessment Patient needs continued PT services  PT Problem List Decreased strength;Decreased activity tolerance;Decreased balance;Decreased mobility;Decreased safety awareness       PT Treatment Interventions DME instruction;Gait training;Stair training;Functional mobility training;Therapeutic activities;Therapeutic exercise;Balance training;Patient/family education    PT Goals (Current goals can be found in the Care Plan section)  Acute Rehab PT Goals Patient Stated Goal: Return home. PT Goal Formulation: With patient Time For Goal Achievement: 12/23/20 Potential to Achieve Goals: Good Additional Goals Additional Goal #1: Pt will independently ambulate > 500 feet with least restrictive device and DOE no greater than 2/4.    Frequency Min 3X/week   Barriers to discharge        Co-evaluation               AM-PAC PT "6 Clicks" Mobility  Outcome Measure Help needed turning from your back to your side while in a flat bed without using bedrails?: None Help needed moving from lying on your back to sitting on the side of a flat bed without using bedrails?: None Help needed moving to and from a bed to a chair (including a wheelchair)?: None Help needed standing up from a chair using your arms (e.g., wheelchair or bedside chair)?: None Help needed to walk in hospital room?: A Little Help needed climbing 3-5 steps with a railing? : A Little 6 Click Score: 22    End of Session Equipment Utilized During Treatment: Gait belt;Oxygen Activity Tolerance: Patient limited by fatigue;Treatment limited secondary to medical complications (Comment) Patient left: in chair;with chair alarm set;with call bell/phone within reach Nurse Communication: Mobility status PT Visit Diagnosis: Unsteadiness on feet (R26.81);Other abnormalities of gait and mobility  (R26.89);Muscle weakness (generalized) (M62.81);Difficulty in walking, not elsewhere classified (R26.2)    Time: 9675-9163 PT Time Calculation (min) (ACUTE ONLY): 43 min   Charges:   PT Evaluation $PT Eval Low Complexity: 1 Low PT Treatments $Therapeutic Activity: 8-22 mins        Acute Rehab  Pager: [phone]   Garwin Brothers, SPT  12/09/2020, 1:17 PM

## 2020-12-09 NOTE — Consult Note (Addendum)
Cardiology Consultation:   Patient ID: [name]: [id number]; DOB: [birth year]  Admit date: 12/08/2020 Date of Consult: 12/09/2020  PCP:  [name] [name], [place]  Cardiologist:  [name], MD Electrophysiologist:  [name], MD  [phone]    Patient Profile:   [name] [name] is a 75 y.o. female with a hx of CAD (CABG 2016), COPD, CKD (III), ICM w/improved LVEF >> and declined again,  chronic CHF, HTN, HLD, persistent AFib, OSA (declines CPAP) who is being seen today for the evaluation of AFib at the request of Dr. Marlou [name].  Historically on amiodarone, stopped 2/2 COPD  History of Present Illness:    [name] recent hx was noted that she was referred to Dr. Lovena [name] via the Afib clinic for uncontrolled AFib, he saw her 08/11/20, with escalating HF symptoms, he recommended AV node ablation/pacing, though the patient had not been taking her digoxin and wanted to try that with her high dose BB 1st. She presented to the ER 08/13/20 with on going symptoms, still had not started the dig and remained in RVR. Started on dig and diuretics, echo done prior to her stay with EF back down. Mention of a missed xarelto dose, and Dr. Lovena [name] did not feel DCCV held any long term utility.  Pt reported some degree of noted rectal bleeding as well, CBC was stable, not anemic.  She was discharged 08/15/20. Home losartan and spironolactone held 2/2 AKI to have BMET at follow up. Mild HS trop that were flat, not felt ischemic. She saw Dr. Marlou [name] in f/u 09/16/20, rate was uncontrolled, and planned to keep EP follow up and consider AVnode ablation and PPM.no changes were made. CM suspected to be tachy-mediated  I saw her 3/7, she not feeling any better or worse, she was aware of her AFib intermittently she did not think she was always in AFib, atypical sounding episgastric ache at hight resolved with protonix, no CP, mild SOB/DOE, she was in AFib 106bpm.  Vitals though at recent  visit 70-80's HR.  Planned for a 1 week monitor to establish rate control and if infact she was having any SR. Losartan was resumed, suspected sleep apnea and recommended sleep study and resume HF team follow up. She was to have follow up with Dr. Lovena [name] to revisit AF management and perhaps pace/ablate.  Monitor noted 1. Atrial fib with a slow VR, controlled VR and RVR through out the study. 2. Fequent PVC's 3. NSVT, longest 11 beats 4. No prolonged pauses 6 Ventricular Tachycardia runs occurred, the run with the fastest interval lasting 11 beats with a max rate of 203 bpm (avg 164 bpm); the run with the fastest interval was also the longest. Some episodes of Ventricular Tachycardia may be possible Atrial  Fibrillation with aberrancy. Atrial Fibrillation occurred continuously (100% burden), ranging from 60-195 bpm (avg of 110 bpm). Isolated VEs were occasional (2.7%, 29160), VE Couplets were rare (<1.0%, 245), and VE Triplets were rare (<1.0%, 15).  Ventricular Bigeminy and Trigeminy were present.   She no-showed to heart failure clinic, Dr. Lovena [name] and Dr. Marlou [name] f/u appts and records indicate h/o noncompliance  Pt saw pulmonary 10/23/20 - she has OSA but refuses CPAP. Started on stiolto, but she didn't tolerate this. Pulmonology prefers stable Afib RVR prior to starting a new inhaler since many can cause tachycardia as a side effect.    She presented to [facility]20 with worsening shortness of breath over the past few  months not improved with her new inhaler. She was diuresed and given breathing treatments. Discharged without admission.   She was admitted to [facility] yesterday c/o generalized weakness, nausea, and vomiting x 1. She was hypotensive on EMS arrival but responded to 300 cc IVF. She reports that someone called and told her to double her torsemide, but that resulted in weakness. Unclear who instructed this. On arrival, she was hypoxic in the 70-80s requiring [place] O2. She was given an  additional 500 cc fluid initially, but then felt to be volume up and given 60 mg IV lasix. Afib with rates in the 80-110s. She was lethargic, unclear on her medicines, not sure if she had missed any medicines including her xarelto.   Digoxin level - 0.6 BNP 1100 sCr 1.47 with K 4.4 Albumin 2.7 HS troponin 20 --> 25 Lactic acid 1.8  Admitted with acute/chronic CHF, got both IVF and IV lasix in the ER, cardiology was consulted and once seen by them, not felt to be volume OL, unclear etiology of her AMS, lethargy. Ph 7.447, PCO231 PO278 HCO321.2 95.6%  Ammonia was elevated 42  TODAY cardiology found her back to her baseline, weaned of O2 and asymptomatic.  Rates 90's-110's, LVEF by echo this admission with further worsening of her LVEF to 25-30%, RVEF mod reduced as well RVSP 55.51mmHg EP is asked to revisit patient while here.  LABS  K+ 3.9 BUN/Creat 34/1.36  BNP 1065 WBC 6.9 H/H 12/42 Plts 127  resp panel negative   The patient tells me she is feeling better except she can't sleep at all here and is very tired.  Drifts to sleep intermittently but when asked to wake tells me she was listening. Given she has been felt to have some AMS I ask orientation questions and she says she is tired of the questions and to just move on. I revisited her AFib, she recalls wearing the monitor and surprised to hear she was always in Afib during it.  She tells me that she missed all those appointments because it is a lot for her to keep up with and just missed them, not intentionally. She tells me (acurately) that she has an appointment to see Dr. Clayborne [name] nurse on 5/9 and was told they were going to start her on a new strong medicine that she thinks is going to help her. She denies CP, no current SOB. She really would like to avoid procedure, pacer, ablation and hopes that treatment with the HF team Jo Cerone help her again     Past Medical History:  Diagnosis Date   ([place]) heart failure  with improved ejection fraction ([place])    a. 07/2018 Echo: EF 30-35%; b. 11/2019 Echo: EF 55-60%, no rwma, Gr2 DD, Nl RV size/fxn. Mild BAE. Mild MR/AI.   Arthritis    Asthma    Atopic dermatitis    CAD (coronary artery disease)    a. 2016 s/p CABG x 2 (LIMA->LAD, VG->OM); b. 08/2018 MV: EF 44%, no ischemia/infact.   Cardiac arrest ([place]) 10/2014   Cardiomyopathy, ischemic    a. 07/2018 Echo: EF 30-35%; 11/2019 Echo: EF 55-60%.   Carotid arterial disease ([place])    a. 11/2019 Carotid U/S   CKD (chronic kidney disease), stage III (HCC)    COPD (chronic obstructive pulmonary disease) ([place])    Esophageal dilatation 2013   GERD (gastroesophageal reflux disease)    Gout    Headache    Hypertension    Hypokalemia    Idiopathic angioedema  LGI bleed 08/06/2017   a. felt to be hemorrhoidal during that admission (no drop in Hgb).   Lower back pain    Paroxysmal atrial fibrillation (HCC)    a. Dx 2016-->h/o difficult to control rates (complicated by noncompliance), not felt to be a candidate for ablation or antiarrhythmic due to noncompliance; b. Recurrent AF 2021 - converted w/ IV dilt; c. CHA2DS2VASc = 7-->Xarelto.   Personal history of noncompliance with medical treatment, presenting hazards to health    Prediabetes    S/P CABG x 2 with clipping of LA appendage 11/14/2014   LIMA to LAD, SVG to OM, EVH via right thigh   Urine incontinence    Uterine fibroid     Past Surgical History:  Procedure Laterality Date   BALLOON DILATION N/A 10/10/2019   Procedure: BALLOON DILATION;  Surgeon: Otis Brace, MD;  Location: [place];  Service: Gastroenterology;  Laterality: N/A;   BIOPSY  10/10/2019   Procedure: BIOPSY;  Surgeon: Otis Brace, MD;  Location: [place];  Service: Gastroenterology;;   BIOPSY  10/11/2019   Procedure: BIOPSY;  Surgeon: Otis Brace, MD;  Location: [place];  Service: Gastroenterology;;   CARDIOVERSION N/A 11/18/2014   Procedure: CARDIOVERSION;   Surgeon: [name], MD;  Location: [place];  Service: Cardiovascular;  Laterality: N/A;   CARDIOVERSION N/A 12/25/2017   Procedure: CARDIOVERSION;  Surgeon: [name], MD;  Location: [place];  Service: Cardiovascular;  Laterality: N/A;   CLIPPING OF ATRIAL APPENDAGE N/A 11/14/2014   Procedure: CLIPPING OF ATRIAL APPENDAGE;  Surgeon: [name], MD;  Location: [place];  Service: Open Heart Surgery;  Laterality: N/A;   COLONOSCOPY  2013   COLONOSCOPY WITH PROPOFOL N/A 10/11/2019   Procedure: COLONOSCOPY WITH PROPOFOL;  Surgeon: Otis Brace, MD;  Location: [place];  Service: Gastroenterology;  Laterality: N/A;   CORONARY ARTERY BYPASS GRAFT N/A 11/14/2014   Procedure: CORONARY ARTERY BYPASS GRAFTING (CABG)TIMES 2 USING LEFT INTERNAL MAMMARY ARTERY AND RIGHT SAPHENOUS VEIN HARVESTED ENDOSCOPICALLY;  Surgeon: [name], MD;  Location: [place];  Service: Open Heart Surgery;  Laterality: N/A;   ESOPHAGOGASTRODUODENOSCOPY (EGD) WITH PROPOFOL N/A 10/10/2019   Procedure: ESOPHAGOGASTRODUODENOSCOPY (EGD) WITH PROPOFOL;  Surgeon: Otis Brace, MD;  Location: [place];  Service: Gastroenterology;  Laterality: N/A;   ESOPHAGOGASTRODUODENOSCOPY (EGD) WITH PROPOFOL N/A 10/27/2019   Procedure: ESOPHAGOGASTRODUODENOSCOPY (EGD) WITH PROPOFOL;  Surgeon: Ronnette Juniper, MD;  Location: [place];  Service: Gastroenterology;  Laterality: N/A;   GIVENS CAPSULE STUDY N/A 10/27/2019   Procedure: GIVENS CAPSULE STUDY;  Surgeon: Ronnette Juniper, MD;  Location: [place];  Service: Gastroenterology;  Laterality: N/A;   LEFT HEART CATHETERIZATION WITH CORONARY ANGIOGRAM N/A 11/04/2014   Procedure: LEFT HEART CATHETERIZATION WITH CORONARY ANGIOGRAM;  Surgeon: Troy Sine, MD;  Location: [facility] CATH LAB;  Service: Cardiovascular;  Laterality: N/A;   POLYPECTOMY  10/11/2019   Procedure: POLYPECTOMY;  Surgeon: Otis Brace, MD;  Location: [place] ENDOSCOPY;  Service: Gastroenterology;;   TEE  WITHOUT CARDIOVERSION N/A 11/14/2014   Procedure: TRANSESOPHAGEAL ECHOCARDIOGRAM (TEE);  Surgeon: [name], MD;  Location: [place];  Service: Open Heart Surgery;  Laterality: N/A;   TEE WITHOUT CARDIOVERSION N/A 12/25/2017   Procedure: TRANSESOPHAGEAL ECHOCARDIOGRAM (TEE);  Surgeon: [name], MD;  Location: [facility] ENDOSCOPY;  Service: Cardiovascular;  Laterality: N/A;   TEMPORARY PACEMAKER INSERTION  11/04/2014   Procedure: TEMPORARY PACEMAKER INSERTION;  Surgeon: Troy Sine, MD;  Location: [facility] CATH LAB;  Service: Cardiovascular;;   TOOTH EXTRACTION  Home Medications:  Prior to Admission medications   Medication Sig Start Date End Date Taking? Authorizing Provider  acetaminophen (TYLENOL) 500 MG tablet Take 1,000 mg by mouth every 6 (six) hours as needed for moderate pain.   Yes [provider]  albuterol (VENTOLIN HFA) 108 (90 Base) MCG/ACT inhaler Inhale 1-2 puffs into the lungs every 6 (six) hours as needed for wheezing or shortness of breath.   Yes [provider]  allopurinol (ZYLOPRIM) 300 MG tablet Take 1 tablet (300 mg total) by mouth daily. 01/04/18  Yes Georgiana Shore, NP  atorvastatin (LIPITOR) 10 MG tablet Take 10 mg by mouth at bedtime.   Yes [provider]  digoxin (LANOXIN) 0.125 MG tablet Take 0.5 tablets (0.0625 mg total) by mouth daily. 08/11/20  Yes Evans Lance, MD  metFORMIN (GLUCOPHAGE) 500 MG tablet Take 500 mg by mouth daily. Daily with a meal   Yes [provider]  metoprolol succinate (TOPROL-XL) 100 MG 24 hr tablet Take 1 tablet (100 mg total) by mouth 2 (two) times daily. 08/15/20  Yes Kroeger, Daleen Snook M., PA-C  pantoprazole (PROTONIX) 40 MG tablet Take 40 mg by mouth in the morning.   Yes [provider]  rivaroxaban (XARELTO) 20 MG TABS tablet Take 1 tablet (20 mg total) by mouth daily with supper. Resume from 11/01/2019 11/01/19  Yes Aline August, MD  Tiotropium Bromide Monohydrate (SPIRIVA RESPIMAT) 2.5  MCG/ACT AERS Inhale 2 puffs into the lungs daily. 10/23/20  Yes Parrett, Tammy S, NP  torsemide (DEMADEX) 20 MG tablet Take 2 tablets (40 mg total) by mouth daily. 08/16/20  Yes Kroeger, Daleen Snook M., PA-C  losartan (COZAAR) 25 MG tablet Take 1 tablet (25 mg total) by mouth daily. Patient not taking: Reported on 12/08/2020 10/19/20 01/17/21  Baldwin Jamaica, PA-C    Inpatient Medications: Scheduled Meds:  allopurinol  300 mg Oral Daily   atorvastatin  10 mg Oral QHS   digoxin  0.0625 mg Oral Daily   insulin aspart  0-9 Units Subcutaneous TID WC   losartan  25 mg Oral Daily   metoprolol succinate  100 mg Oral BID   pantoprazole  40 mg Oral q AM   predniSONE  40 mg Oral Q breakfast   rivaroxaban  20 mg Oral Q supper   sodium chloride flush  3 mL Intravenous Q12H   torsemide  40 mg Oral Daily   umeclidinium bromide  1 puff Inhalation Daily   Continuous Infusions:  sodium chloride     PRN Meds: sodium chloride, acetaminophen, albuterol, ondansetron (ZOFRAN) IV, sodium chloride flush  Allergies:    Allergies  Allergen Reactions   Bee Venom Anaphylaxis   Ivp Dye [Iodinated Diagnostic Agents] Anaphylaxis   Codeine Nausea And Vomiting   Entresto [Sacubitril-Valsartan] Other (See Comments)    Chest pain    Shrimp [Shellfish Allergy] Swelling   Jardiance [Empagliflozin] Other (See Comments)    Caused Boils     Social History:   Social History   Socioeconomic History   Marital status: Widowed    Spouse name: Not on file   Number of children: 1   Years of education: 57   Highest education level: Not on file  Occupational History   Occupation: RETIRED [employer]  Tobacco Use   Smoking status: Current Every Day Smoker    Packs/day: 0.50    Years: 56.00    Pack years: 28.00    Types: Cigarettes   Smokeless tobacco: Never Used  Tobacco comment: smoking 2 cigarettes/day as of 10/23/20  Vaping Use   Vaping Use: Never used  Substance and Sexual Activity   Alcohol use: No     Alcohol/week: 0.0 standard drinks   Drug use: No   Sexual activity: Not on file  Other Topics Concern   Not on file  Social History Narrative   Patient reports it being difficult to pay for everything due to outstanding medical bills from hospital stay last year but states she is able to keep up with basic expenses.  Patient does have some concerns with her house's condition due to a water leak- had her roof repaired last month but has some leaking in the front which has affected her [name].  Patient owns her own car and is able to drive herself but has some concerns about the reliability of her car- has gotten taxi to the clinic for appointment in the past when her car broke down.   Social Determinants of Health   Financial Resource Strain: Not on file  Food Insecurity: Not on file  Transportation Needs: Not on file  Physical Activity: Not on file  Stress: Not on file  Social Connections: Not on file  Intimate Partner Violence: Not on file    Family History:   Family History  Problem Relation Age of Onset   Cancer Mother        LYMPHOMA   Heart disease Father    Other Father        TB   CVA Sister    Prostate cancer Brother 9   Diabetes Brother      ROS:  Please see the history of present illness.  All other ROS reviewed and negative.     Physical Exam/Data:   Vitals:   12/09/20 0350 12/09/20 0727 12/09/20 0857 12/09/20 0949  BP: 131/90 (!) 135/91  (!) 117/98  Pulse: 73 77  63  Resp: (!) 24 17  18   Temp: 97.6 F (36.4 C) (!) 97.3 F (36.3 C)    TempSrc: Oral Oral    SpO2: 98% 98% 97% 97%  Weight: 88.6 kg     Height:        Intake/Output Summary (Last 24 hours) at 12/09/2020 1150 Last data filed at 12/09/2020 0838 Gross per 24 hour  Intake 840 ml  Output 550 ml  Net 290 ml   Last 3 Weights 12/09/2020 12/08/2020 12/08/2020  Weight (lbs) 195 lb 5.2 oz 195 lb 1.7 oz 192 lb  Weight (kg) 88.6 kg 88.5 kg 87.091 kg     Body mass index is 30.59 kg/m.  General:   Well nourished, well developed, in no acute distress HEENT: normal Lymph: no adenopathy Neck: no JVD Endocrine:  No thryomegaly Vascular: No carotid bruits  Cardiac:  irreg-irreg; soft SM, no gallops or rubs Lungs: CTA b/l, no wheezing, rhonchi or rales  Abd: soft, nontender  Ext: trace edema Musculoskeletal:  No deformities Skin: warm and dry  Neuro:  No gross focal abnormalities noted, declined to answer orientation questions. Is clear in speech and moving extremities without gross deficits noted Psych:  Normal affect, she is interactive and pleasant, declines to answer orientation questions  EKG:  The EKG was personally reviewed and demonstrates:    Afib 116bpm, no acute changes Afib 95bpm, Lat inversions  Telemetry:  Telemetry was personally reviewed and demonstrates:   Afib 90's-110's Rare NSVT 6 beats longest  Relevant CV Studies:  Echocardiogram 12/08/20: 1. Left ventricular ejection fraction, by estimation, is  25 to 30%. Left  ventricular ejection fraction by 3D volume is 22 %. The left ventricle has  severely decreased function. The left ventricle demonstrates global  hypokinesis. The left ventricular  internal cavity size was mildly dilated. There is mild concentric left  ventricular hypertrophy. Left ventricular diastolic function could not be  evaluated.   2. Right ventricular systolic function is moderately reduced. The right  ventricular size is moderately enlarged. There is moderately elevated  pulmonary artery systolic pressure. The estimated right ventricular  systolic pressure is 123XX123 mmHg.   3. Left atrial size was severely dilated.   4. Right atrial size was moderately dilated.   5. The mitral valve is grossly normal. Mild to moderate mitral valve  regurgitation.   6. Tricuspid valve regurgitation is moderate.   7. The aortic valve is tricuspid. There is moderate calcification of the  aortic valve. There is mild thickening of the aortic valve. Aortic  valve  regurgitation is trivial. Mild to moderate aortic valve  sclerosis/calcification is present, without any  evidence of aortic stenosis.   8. The inferior vena cava is dilated in size with <50% respiratory  variability, suggesting right atrial pressure of 15 mmHg.   Comparison(s): The left ventricular function is worsened. There is  evidence of increased filling pressures, increased PA pressure and worse  tricuspid insufficiency (all consistent with worsened hypervolemia/heart  failure exacerbation).     06/27/2020: TTE IMPRESSIONS   1. Global hypokinesis with akinesis of the inferior and inferolateral  wall.   2. Left ventricular ejection fraction, by estimation, is 30 to 35%. The  left ventricle has moderate to severely decreased function. The left  ventricle demonstrates regional wall motion abnormalities (see scoring  diagram/findings for description). There  is moderate left ventricular hypertrophy. Left ventricular diastolic  function could not be evaluated.   3. Right ventricular systolic function is normal. The right ventricular  size is normal. There is normal pulmonary artery systolic pressure.   4. Left atrial size was severely dilated.   5. Right atrial size was moderately dilated.   6. The mitral valve is normal in structure. Mild mitral valve  regurgitation. No evidence of mitral stenosis.   7. The aortic valve is tricuspid. Aortic valve regurgitation is trivial.  Mild to moderate aortic valve sclerosis/calcification is present, without  any evidence of aortic stenosis.   8. The inferior vena cava is dilated in size with >50% respiratory  variability, suggesting right atrial pressure of 8 mmHg.      12/02/2019: TTE IMPRESSIONS   1. Left ventricular ejection fraction, by estimation, is 55 to 60%. The  left ventricle has normal function. The left ventricle demonstrates  regional wall motion abnormalities (see scoring diagram/findings for  description). There  is moderate left  ventricular hypertrophy. Left ventricular diastolic parameters are  consistent with Grade II diastolic dysfunction (pseudonormalization).  Elevated left ventricular end-diastolic pressure.   2. Right ventricular systolic function is normal. The right ventricular  size is normal. Tricuspid regurgitation signal is inadequate for assessing  PA pressure.   3. Left atrial size was mildly dilated.   4. Right atrial size was mildly dilated.   5. The mitral valve is abnormal. Mild mitral valve regurgitation. No  evidence of mitral stenosis.   6. The aortic valve is abnormal. Aortic valve regurgitation is mild. Mild  aortic valve sclerosis is present, with no evidence of aortic valve  stenosis.   7. The inferior vena cava is dilated in size with >50%  respiratory  variability, suggesting right atrial pressure of 8 mmHg.    LV Wall Scoring:  Hypokinesis of the lateral wall from base to mid ventricle.     10/10/19: LVEF 60-65%, no WMA     09/07/2018: stress myoview Nuclear stress EF: 44%. There was no ST segment deviation noted during stress. The study is normal. This is a [name] risk study. The left ventricular ejection fraction is moderately decreased (30-44%).     08/03/2018: LVEF 30-35%, no WMA    Laboratory Data:  High Sensitivity Troponin:   Recent Labs  Lab 11/15/20 0951 11/15/20 1152 12/08/20 0936 12/08/20 0950  TROPONINIHS 23* 21* 20* 25*     Chemistry Recent Labs  Lab 12/08/20 0936 12/09/20 0635  NA 141 142  K 4.4 3.9  CL 110 109  CO2 19* 21*  GLUCOSE 126* 165*  BUN 31* 34*  CREATININE 1.47* 1.36*  CALCIUM 8.8* 9.0  GFRNONAA 37* 41*  ANIONGAP 12 12    Recent Labs  Lab 12/08/20 0936  PROT 5.5*  ALBUMIN 2.7*  AST 25  ALT 14  ALKPHOS 120  BILITOT 2.0*   Hematology Recent Labs  Lab 12/08/20 0830  WBC 6.9  RBC 4.89  HGB 12.9  HCT 42.9  MCV 87.7  MCH 26.4  MCHC 30.1  RDW 20.8*  PLT 127*   BNP Recent Labs  Lab 12/08/20 0830   BNP 1,065.4*    DDimer No results for input(s): DDIMER in the last 168 hours.   Radiology/Studies:  [facility] Chest Port 1 View Result Date: 12/08/2020 CLINICAL DATA:  Questionable sepsis. EXAM: PORTABLE CHEST 1 VIEW COMPARISON:  11/15/2020.  06/26/2020.  03/23/2018. FINDINGS: Prior CABG. Cardiomegaly. No pulmonary venous congestion. Very mild bilateral interstitial prominence. Very mild interstitial edema and/or pneumonitis cannot be excluded. Mild left base subsegmental atelectasis and or scarring. No pleural effusion or pneumothorax. Degenerative change thoracic spine. IMPRESSION: 1. Prior CABG. Cardiomegaly. Very mild bilateral interstitial prominence. Very mild interstitial edema and/or pneumonitis cannot be excluded. 2. Mild left base subsegmental atelectasis and or scarring Electronically Signed   By: [place]   On: 12/08/2020 09:13    Assessment and Plan:   1. Longstanding persistent Afib     CHA2DS2Vasc is 6     On Xarelto, appropriately dosed, suspect noncompliance  I don't think she is a PVI candidate given her non-compliance For the same reason not a Tikosyn/sotalol candidate  Reportedly has been historically on amiodarone and stopped 2/2 COPD She does not want to consider yet (again) pace/ablate or procedural strategies for AFib management LA is described as severely dilated measured 61mm Her last SR EKG was 05/04/2020  HR is not terrible Home dig is 0.0625 daily Toprol 100 mg BID  ?compliance  dilt not ideal given her CM/CHF  Dr. Curt Bears Agostino Gorin see her later this afternoon   2. Mixed ischemic/NICM     LVEF continues to decline     Now-showed to outpt HF clinic visit to get re-estabish (she did historically have interval improvement in EF) > is scheduled for 12/21/20      Not felt to be volume OL currently , got IV lasix in the ER      Creat stable   Risk Assessment/Risk Scores:  { For questions or updates, please contact [place] Please consult  [email or web address] for contact info under    Signed, Baldwin Jamaica, PA-C  12/09/2020 11:50 AM  I have seen and examined this patient with Tommye Standard.  Agree with  above, note added to reflect my findings.  On exam, irregular, no murmurs.  She has a history significant for mixed cardiomyopathy and atrial fibrillation.  Unfortunately her ejection fraction continues to decline.  She has been in atrial fibrillation since being in the hospital.  Her heart rates have been between 90 and 110.  Per the race 2 trial, this is likely an acceptable level.  It is certainly possible that her [name] ejection fraction is due to rapid atrial fibrillation as she has been noncompliant with her medications.  She is also been noncompliant with EP clinic visits.  At this point, we Jeily Guthridge hold off on EP procedures and Elber Galyean potentially plan for outpatient procedures.  She does need to prove that she Ezri Fanguy be compliant with both medications and follow-ups.  [place] M. Phillis Thackeray MD 12/09/2020 6:21 PM

## 2020-12-09 NOTE — Progress Notes (Signed)
SATURATION QUALIFICATIONS: (This note is used to comply with regulatory documentation for home oxygen)  Patient Saturations on Room Air at Rest = 97%  Patient Saturations on Room Air while Ambulating = 88-97%. Pt ambulates for 2 trials, initial trial with sats from 88-92%. 2nd attempt with sats from 93-97%.  Patient Saturations on 2 Liters of oxygen while Ambulating = 92%  Pt does demonstrate increased work of breathing and requires standing rest breaks when ambulating with and without supplemental oxygen.  Pt has COPD, if supplemental oxygen levels are acceptable at 88% and above then the patient does not require home supplemental oxygen. If the patient requires oxygen saturation at or above 92% then the patient will benefit from home supplemental oxygen to improve oxygen saturation and activity tolerance.

## 2020-12-09 NOTE — Evaluation (Signed)
Occupational Therapy Evaluation Patient Details Name: [name] [name] MRN: [id number] DOB: Dec 30, 1945 Today's Date: 12/09/2020    History of Present Illness 75 y.o. female presents to [facility] ED on 12/08/2020 with reports of pain and weakness. Pt found to be in Afib and hypotensive en route to ED. Pt also found to be hypoxic in ED. Pt admitted for hypoxia and CHF management. PMH includes persistent A. fib, HTN, HLD, CHF with EF 30 to 35%, CAD s/p CABG in 2016, cardiac arrest, CKD stage III, COPD, GERD, and gout.   Clinical Impression   Pt is Mod I - Ind with ADLs/selfcare and transfers, min guard A for safety with ambulation using no AD. All education completed and no further acute OT services are indicated at this time. Pt to continue with acute PT services to address functional mobility safety    Follow Up Recommendations  No OT follow up    Equipment Recommendations  None recommended by OT    Recommendations for Other Services       Precautions / Restrictions Precautions Precautions: Fall Restrictions Weight Bearing Restrictions: No      Mobility Bed Mobility Overal bed mobility: Modified Independent                  Transfers Overall transfer level: Independent Equipment used: None                  Balance Overall balance assessment: Mild deficits observed, not formally tested                                         ADL either performed or assessed with clinical judgement   ADL Overall ADL's : Modified independent;Independent                                       General ADL Comments: Ind with toilet and shower transfers, min guard A with ambulation no AD     Vision Baseline Vision/History: Wears glasses Patient Visual Report: No change from baseline       Perception     Praxis      Pertinent Vitals/Pain Pain Assessment: No/denies pain Faces Pain Scale: No hurt Pain Location: L lateral trunk Pain Descriptors /  Indicators: Discomfort;Grimacing Pain Intervention(s): Monitored during session     Hand Dominance Right   Extremity/Trunk Assessment Upper Extremity Assessment Upper Extremity Assessment: Generalized weakness   Lower Extremity Assessment Lower Extremity Assessment: Defer to PT evaluation   Cervical / Trunk Assessment Cervical / Trunk Assessment: Normal   Communication Communication Communication: No difficulties   Cognition Arousal/Alertness: Awake/alert Behavior During Therapy: WFL for tasks assessed/performed Overall Cognitive Status: No family/caregiver present to determine baseline cognitive functioning                                     General Comments  Pt reports feeling fine at start of session. Pt demonstrates confusion regarding oxygen sat levels and indications for oxygen necessity.    Exercises     Shoulder Instructions      Home Living Family/patient expects to be discharged to:: Private residence Living Arrangements: Alone Available Help at Discharge: Family;Friend(s);Available PRN/intermittently Type of Home: House Home Access: Stairs to enter [employer]  of Steps: 3 Entrance Stairs-Rails: Can reach both Home Layout: One level     Bathroom Shower/Tub: Teacher, early years/pre: Standard     Home Equipment: Cane - single point          Prior Functioning/Environment Level of Independence: Independent with assistive device(s)    ADL's / Homemaking Assistance Needed: Pt reports that she is Ind with ADLs/selfcare, cooking, does not drive   Comments: Pt reports primarily using cane with stair negotiation.        OT Problem List: Decreased activity tolerance      OT Treatment/Interventions:      OT Goals(Current goals can be found in the care plan section) Acute Rehab OT Goals Patient Stated Goal: Return home. OT Goal Formulation: With patient  OT Frequency:     Barriers to D/C:             Co-evaluation              AM-PAC OT "6 Clicks" Daily Activity     Outcome Measure Help from another person eating meals?: None Help from another person taking care of personal grooming?: None Help from another person toileting, which includes using toliet, bedpan, or urinal?: None Help from another person bathing (including washing, rinsing, drying)?: A Little Help from another person to put on and taking off regular upper body clothing?: None Help from another person to put on and taking off regular lower body clothing?: A Little 6 Click Score: 22   End of Session    Activity Tolerance: Patient tolerated treatment well Patient left: in chair;with call bell/phone within reach;with chair alarm set  OT Visit Diagnosis: Muscle weakness (generalized) (M62.81)                Time: 1761-6073 OT Time Calculation (min): 25 min Charges:  OT General Charges $OT Visit: 1 Visit OT Evaluation $OT Eval Low Complexity: 1 Low OT Treatments $Self Care/Home Management : 8-22 mins    Britt Bottom 12/09/2020, 1:48 PM

## 2020-12-09 NOTE — Discharge Instructions (Signed)
Heart Failure, Diagnosis  Heart failure means that your heart is not able to pump blood in the right way. This makes it hard for your body to work well. Heart failure is usually a long-term (chronic) condition. You must take good care of yourself and follow your treatment plan from your doctor. What are the causes?  High blood pressure.  Buildup of cholesterol and fat in the arteries.  Heart attack. This injures the heart muscle.  Heart valves that do not open and close properly.  Damage of the heart muscle. This is also called cardiomyopathy.  Infection of the heart muscle. This is also called myocarditis.  Lung disease. What increases the risk?  Getting older. The risk of heart failure goes up as a person ages.  Being overweight.  Being female.  Use tobacco or nicotine products.  Abusing alcohol or drugs.  Having taken medicines that can damage the heart.  Having any of these conditions: ? Diabetes. ? Abnormal heart rhythms. ? Thyroid problems. ? Low blood counts (anemia).  Having a family history of heart failure. What are the signs or symptoms?  Shortness of breath.  Coughing.  Swelling of the feet, ankles, legs, or belly.  Losing or gaining weight for no reason.  Trouble breathing.  Waking from sleep because of the need to sit up and get more air.  Fast heartbeat.  Being very tired.  Feeling dizzy, or feeling like you may pass out (faint).  Having no desire to eat.  Feeling like you may vomit (nauseous).  Peeing (urinating) more at night.  Feeling confused. How is this treated? This condition may be treated with:  Medicines. These can be given to treat blood pressure and to make the heart muscles stronger.  Changes in your daily life. These may include: ? Eating a healthy diet. ? Staying at a healthy body weight. ? Quitting tobacco, alcohol, and drug use. ? Doing exercises. ? Participating in a cardiac rehabilitation program. This program  helps you improve your health through exercise, education, and counseling.  Surgery. Surgery can be done to open blocked valves, or to put devices in the heart, such as pacemakers.  A donor heart (heart transplant). You will receive a healthy heart from a donor. Follow these instructions at home:  Treat other conditions as told by your doctor. These may include high blood pressure, diabetes, thyroid disease, or abnormal heart rhythms.  Learn as much as you can about heart failure.  Get support as you need it.  Keep all follow-up visits. Summary  Heart failure means that your heart is not able to pump blood in the right way.  This condition is often caused by high blood pressure, heart attack, or damage of the heart muscle.  Symptoms of this condition include shortness of breath and swelling of the feet, ankles, legs, or belly. You may also feel very tired or feel like you may vomit.  You may be treated with medicines, surgery, or changes in your daily life.  Treat other health conditions as told by your doctor. This information is not intended to replace advice given to you by your health care provider. Make sure you discuss any questions you have with your health care provider. Document Revised: 02/22/2020 Document Reviewed: 02/22/2020 Elsevier Patient Education  2021 Elsevier Inc.  

## 2020-12-09 NOTE — TOC Initial Note (Signed)
Transition of Care [facility]) - Initial/Assessment Note    Patient Details  Name: [name] [name] MRN: [id number] Date of Birth: [birth year]  Transition of Care [facility]) CM/SW Contact:    Zenon Mayo, RN Phone Number: 12/09/2020, 12:43 PM  Clinical Narrative:                 NCM spoke with patient at bedside, she states she does not have a bp cuff, but she has a scale, she states she drives to the store and to [employer] apt[employer].  Her brother friend comes by to check on her.  She has no oxygen at home, she would like to get a cane and pill cutter.  NCM made referral to [facility] with [employer] for single point cane.   Expected Discharge Plan: Home/Self Care Barriers to Discharge: Continued Medical Work up   Patient Goals and CMS Choice Patient states their goals for this hospitalization and ongoing recovery are:: return home      Expected Discharge Plan and Services Expected Discharge Plan: Home/Self Care   Discharge Planning Services: CM Consult Post Acute Care Choice: Durable Medical Equipment Living arrangements for the past 2 months: Single Family Home                 DME Arranged: Kasandra Knudsen DME Agency: [employer] Date DME Agency Contacted: 12/09/20 Time DME Agency Contacted: [phone] Representative spoke with at DME Agency: Adela Lank            Prior Living Arrangements/Services Living arrangements for the past 2 months: [place] Lives with:: Self Patient language and need for interpreter reviewed:: Yes Do you feel safe going back to the place where you live?: Yes      Need for Family Participation in Patient Care: Yes (Comment) Care giver support system in place?: Yes (comment)   Criminal Activity/Legal Involvement Pertinent to Current Situation/Hospitalization: No - Comment as needed  Activities of Daily Living      Permission Sought/Granted                  Emotional Assessment Appearance:: Appears stated age Attitude/Demeanor/Rapport: Engaged Affect (typically  observed): Appropriate Orientation: : Oriented to Place,Oriented to Self,Oriented to  Time,Oriented to Situation Alcohol / Substance Use: Not Applicable Psych Involvement: No (comment)  Admission diagnosis:  Acute respiratory failure with hypoxia ([place]) [J96.01] Acute on chronic congestive heart failure, unspecified heart failure type [facility]) [I50.9] Patient Active Problem List   Diagnosis Date Noted  . Acute respiratory failure with hypoxia ([place]) 12/08/2020  . Hypotension 12/08/2020  . Hypoalbuminemia 12/08/2020  . Obstructive sleep apnea 09/21/2020  . Atrial fibrillation with rapid ventricular response ([place]) 08/13/2020  . Acute lower GI bleeding 02/10/2020  . Tobacco dependence 02/10/2020  . Obesity (BMI 30.0-34.9) 02/10/2020  . Acute upper GI bleeding 10/26/2019  . COPD (chronic obstructive pulmonary disease) ([place]) 10/26/2019  . Acute GI bleeding 10/09/2019  . Acute blood loss anemia 10/09/2019  . Postmenopausal vaginal bleeding 01/04/2018  . Snoring 01/04/2018  . Acute on chronic combined systolic and diastolic CHF (congestive heart failure) ([place])   . CHF exacerbation ([place]) 10/16/2017  . Noncompliance 10/16/2017  . Acute on chronic congestive heart failure ([place])   . Thrombocytopenia ([place]) 09/14/2017  . Acute drug-induced gout of right foot   . Medication noncompliance due to cognitive impairment 09/06/2017  . Acute diastolic CHF (congestive heart failure) ([place]) 08/06/2017  . LGI bleed, likely hemorrhoids 08/06/2017  . Persistent atrial fibrillation ([place]) 08/04/2017  . Paroxysmal atrial fibrillation (  [place]) 02/08/2017  . Cardiomyopathy, ischemic 02/08/2017  . Dysphagia 02/08/2017  . CKD (chronic kidney disease), stage III ([place]) 01/30/2017  . Pain in shoulder 02/08/2016  . Breast pain, left 02/08/2016  . Bilateral arm numbness and tingling while sleeping 02/08/2016  . Painful lumpy left breast 09/23/2015  . [place] intertrigo 02/11/2015  . S/P CABG x 2 11/14/2014  .  Accelerated hypertension   . Cardiac arrest ([place]) 11/04/2014  . Left main coronary artery disease 11/04/2014  . Coronary artery disease due to lipid rich plaque   . Acute respiratory failure with hypoxemia ([place])   . Essential hypertension   . Atrial fibrillation with RVR ([place])   . Hypokalemia 10/30/2014  . Chronic combined systolic and diastolic CHF (congestive heart failure) ([place]) 10/30/2014  . NSTEMI (non-ST elevated myocardial infarction) ([place])   . DOE (dyspnea on exertion)   . CAP (community acquired pneumonia) 10/29/2014   PCP:  [name], MD Pharmacy:   [facility] - [place], [place] - [address] [place] [address] Phone: [phone] Fax: [phone]  [facility] - [place], [place] - [address] [place] [address] Phone: [phone] Fax: [phone]     Social Determinants of Health (SDOH) Interventions    Readmission Risk Interventions Readmission Risk Prevention Plan 12/09/2020  Transportation Screening Complete  PCP or Specialist Appt within 3-5 Days Complete  HRI or Home Care Consult Complete  Social Work Consult for [place] Planning/Counseling Complete  Palliative Care Screening Complete  Medication Review [employer]) Complete  Some recent data might be hidden

## 2020-12-09 NOTE — Progress Notes (Signed)
Pt. With orders to discharge. Pts. Legal guardian, Jannice Beitzel, made aware. Pt. Alert and stable. Discharge instructions given.

## 2020-12-13 LAB — CULTURE, BLOOD (SINGLE)
Culture: NO GROWTH
Culture: NO GROWTH
Special Requests: ADEQUATE
Special Requests: ADEQUATE

## 2020-12-21 ENCOUNTER — Encounter (HOSPITAL_COMMUNITY): Payer: HMO

## 2020-12-22 ENCOUNTER — Other Ambulatory Visit: Payer: Self-pay | Admitting: Medical

## 2020-12-24 DIAGNOSIS — N183 Chronic kidney disease, stage 3 unspecified: Secondary | ICD-10-CM | POA: Diagnosis not present

## 2020-12-24 DIAGNOSIS — E7800 Pure hypercholesterolemia, unspecified: Secondary | ICD-10-CM | POA: Diagnosis not present

## 2020-12-24 DIAGNOSIS — K219 Gastro-esophageal reflux disease without esophagitis: Secondary | ICD-10-CM | POA: Diagnosis not present

## 2020-12-24 DIAGNOSIS — I509 Heart failure, unspecified: Secondary | ICD-10-CM | POA: Diagnosis not present

## 2020-12-24 DIAGNOSIS — I25810 Atherosclerosis of coronary artery bypass graft(s) without angina pectoris: Secondary | ICD-10-CM | POA: Diagnosis not present

## 2020-12-24 DIAGNOSIS — J449 Chronic obstructive pulmonary disease, unspecified: Secondary | ICD-10-CM | POA: Diagnosis not present

## 2020-12-24 DIAGNOSIS — E1122 Type 2 diabetes mellitus with diabetic chronic kidney disease: Secondary | ICD-10-CM | POA: Diagnosis not present

## 2020-12-24 DIAGNOSIS — M1990 Unspecified osteoarthritis, unspecified site: Secondary | ICD-10-CM | POA: Diagnosis not present

## 2020-12-24 DIAGNOSIS — I10 Essential (primary) hypertension: Secondary | ICD-10-CM | POA: Diagnosis not present

## 2020-12-24 DIAGNOSIS — H269 Unspecified cataract: Secondary | ICD-10-CM | POA: Diagnosis not present

## 2020-12-24 DIAGNOSIS — J441 Chronic obstructive pulmonary disease with (acute) exacerbation: Secondary | ICD-10-CM | POA: Diagnosis not present

## 2020-12-24 DIAGNOSIS — I4891 Unspecified atrial fibrillation: Secondary | ICD-10-CM | POA: Diagnosis not present

## 2020-12-28 DIAGNOSIS — I5023 Acute on chronic systolic (congestive) heart failure: Secondary | ICD-10-CM | POA: Diagnosis not present

## 2020-12-28 DIAGNOSIS — J9691 Respiratory failure, unspecified with hypoxia: Secondary | ICD-10-CM | POA: Diagnosis not present

## 2020-12-28 DIAGNOSIS — J441 Chronic obstructive pulmonary disease with (acute) exacerbation: Secondary | ICD-10-CM | POA: Diagnosis not present

## 2020-12-31 ENCOUNTER — Ambulatory Visit (INDEPENDENT_AMBULATORY_CARE_PROVIDER_SITE_OTHER): Payer: HMO | Admitting: Internal Medicine

## 2020-12-31 ENCOUNTER — Encounter: Payer: Self-pay | Admitting: Internal Medicine

## 2020-12-31 ENCOUNTER — Other Ambulatory Visit: Payer: Self-pay

## 2020-12-31 VITALS — BP 102/66 | HR 112 | Ht 67.0 in | Wt 193.0 lb

## 2020-12-31 DIAGNOSIS — I5042 Chronic combined systolic (congestive) and diastolic (congestive) heart failure: Secondary | ICD-10-CM | POA: Diagnosis not present

## 2020-12-31 DIAGNOSIS — I4819 Other persistent atrial fibrillation: Secondary | ICD-10-CM | POA: Diagnosis not present

## 2020-12-31 LAB — BASIC METABOLIC PANEL
BUN/Creatinine Ratio: 20 (ref 12–28)
BUN: 29 mg/dL — ABNORMAL HIGH (ref 8–27)
CO2: 19 mmol/L — ABNORMAL LOW (ref 20–29)
Calcium: 8.6 mg/dL — ABNORMAL LOW (ref 8.7–10.3)
Chloride: 106 mmol/L (ref 96–106)
Creatinine, Ser: 1.46 mg/dL — ABNORMAL HIGH (ref 0.57–1.00)
Glucose: 95 mg/dL (ref 65–99)
Potassium: 3.8 mmol/L (ref 3.5–5.2)
Sodium: 144 mmol/L (ref 134–144)
eGFR: 37 mL/min/{1.73_m2} — ABNORMAL LOW (ref >59)

## 2020-12-31 LAB — CBC WITH DIFFERENTIAL/PLATELET
Basophils Absolute: 0.1 10*3/uL (ref 0.0–0.2)
Basos: 1 %
EOS (ABSOLUTE): 0.1 10*3/uL (ref 0.0–0.4)
Eos: 1 %
Hematocrit: 48 % — ABNORMAL HIGH (ref 34.0–46.6)
Hemoglobin: 15.5 g/dL (ref 11.1–15.9)
Immature Grans (Abs): 0 10*3/uL (ref 0.0–0.1)
Immature Granulocytes: 0 %
Lymphocytes Absolute: 1.8 10*3/uL (ref 0.7–3.1)
Lymphs: 27 %
MCH: 27 pg (ref 26.6–33.0)
MCHC: 32.3 g/dL (ref 31.5–35.7)
MCV: 84 fL (ref 79–97)
Monocytes Absolute: 0.6 10*3/uL (ref 0.1–0.9)
Monocytes: 10 %
Neutrophils Absolute: 4.1 10*3/uL (ref 1.4–7.0)
Neutrophils: 61 %
Platelets: 144 10*3/uL — ABNORMAL LOW (ref 150–450)
RBC: 5.74 x10E6/uL — ABNORMAL HIGH (ref 3.77–5.28)
RDW: 18.6 % — ABNORMAL HIGH (ref 11.7–15.4)
WBC: 6.7 10*3/uL (ref 3.4–10.8)

## 2020-12-31 NOTE — Patient Instructions (Addendum)
Medication Instructions:  Your physician recommends that you continue on your current medications as directed. Please refer to the Current Medication list given to you today.  Labwork: You will get lab work today:  BMP and CBC  Testing/Procedures: Your physician has recommended that you have an ablation. Catheter ablation is a medical procedure used to treat some cardiac arrhythmias (irregular heartbeats). During catheter ablation, a long, thin, flexible tube is put into a blood vessel in your groin (upper thigh), or neck. This tube is called an ablation catheter. It is then guided to your heart through the blood vessel. Radio frequency waves destroy small areas of heart tissue where abnormal heartbeats may cause an arrhythmia to start.   Your physician has recommended that you have a pacemaker inserted. A pacemaker is a small device that is placed under the skin of your chest or abdomen to help control abnormal heart rhythms. This device uses electrical pulses to prompt the heart to beat at a normal rate. Pacemakers are used to treat heart rhythms that are too slow. Wire (leads) are attached to the pacemaker that goes into the chambers of you heart. This is done in the hospital and usually requires and overnight stay.  Follow-Up:  SEE INSTRUCTION LETTER  Any Other Special Instructions Will Be Listed Below (If Applicable).  If you need a refill on your cardiac medications before your next appointment, please call your pharmacy.    Pacemaker Implantation, Adult Pacemaker implantation is a procedure to place a pacemaker inside the chest. A pacemaker is a small computer that sends electrical signals to the heart and helps the heart beat normally. A pacemaker also stores information about heart rhythms. You may need pacemaker implantation if you have:  A slow heartbeat (bradycardia).  Loss of consciousness that happens repeatedly (syncope) or repeated episodes of dizziness or light-headedness  because of an irregular heart rate.  Shortness of breath (dyspnea) due to heart problems. The pacemaker usually attaches to your heart through a wire called a lead. One or two leads may be needed. There are different types of pacemakers:  Transvenous pacemaker. This type is placed under the skin or muscle of your upper chest area. The lead goes through a vein in the chest area to reach the inside of the heart.  Epicardial pacemaker. This type is placed under the skin or muscle of your chest or abdomen. The lead goes through your chest to the outside of the heart. Tell a health care provider about:  Any allergies you have.  All medicines you are taking, including vitamins, herbs, eye drops, creams, and over-the-counter medicines.  Any problems you or family members have had with anesthetic medicines.  Any blood or bone disorders you have.  Any surgeries you have had.  Any medical conditions you have.  Whether you are pregnant or may be pregnant. What are the risks? Generally, this is a safe procedure. However, problems may occur, including:  Infection.  Bleeding.  Failure of the pacemaker or the lead.  Collapse of a lung or bleeding into a lung.  Blood clot inside a blood vessel with a lead.  Damage to the heart.  Infection inside the heart (endocarditis).  Allergic reactions to medicines. What happens before the procedure? Staying hydrated Follow instructions from your health care provider about hydration, which may include:  Up to 2 hours before the procedure - you may continue to drink clear liquids, such as water, clear fruit juice, black coffee, and plain tea.   Eating  and drinking restrictions Follow instructions from your health care provider about eating and drinking, which may include:  8 hours before the procedure - stop eating heavy meals or foods, such as meat, fried foods, or fatty foods.  6 hours before the procedure - stop eating light meals or foods,  such as toast or cereal.  6 hours before the procedure - stop drinking milk or drinks that contain milk.  2 hours before the procedure - stop drinking clear liquids. Medicines Ask your health care provider about:  Changing or stopping your regular medicines. This is especially important if you are taking diabetes medicines or blood thinners.  Taking medicines such as aspirin and ibuprofen. These medicines can thin your blood. Do not take these medicines unless your health care provider tells you to take them.  Taking over-the-counter medicines, vitamins, herbs, and supplements. Tests You may have:  A heart evaluation. This may include: ? An electrocardiogram (ECG). This involves placing patches on your skin to check your heart rhythm. ? A chest X-ray. ? An echocardiogram. This is a test that uses sound waves (ultrasound) to produce an image of the heart. ? A cardiac rhythm monitor. This is used to record your heart rhythm and any events for a longer period of time.  Blood tests.  Genetic testing. General instructions  Do not use any products that contain nicotine or tobacco for at least 4 weeks before the procedure. These products include cigarettes, e-cigarettes, and chewing tobacco. If you need help quitting, ask your health care provider.  Ask your health care provider: ? How your surgery site will be marked. ? What steps will be taken to help prevent infection. These steps may include:  Removing hair at the surgery site.  Washing skin with a germ-killing soap.  Receiving antibiotic medicine.  Plan to have someone take you home from the hospital or clinic.  If you will be going home right after the procedure, plan to have someone with you for 24 hours. What happens during the procedure?  An IV will be inserted into one of your veins.  You will be given one or more of the following: ? A medicine to help you relax (sedative). ? A medicine to numb the area (local  anesthetic). ? A medicine to make you fall asleep (general anesthetic).  The next steps vary depending on the type of pacemaker you will be getting. ? If you are getting a transvenous pacemaker:  An incision will be made in your upper chest.  A pocket will be made for the pacemaker. It may be placed under the skin or between layers of muscle.  The lead will be inserted into a blood vessel that goes to the heart.  While X-rays are taken by an imaging machine (fluoroscopy), the lead will be advanced through the vein to the inside of your heart.  The other end of the lead will be tunneled under the skin and attached to the pacemaker. ? If you are getting an epicardial pacemaker:  An incision will be made near your ribs or breastbone (sternum) for the lead.  The lead will be attached to the outside of your heart.  Another incision will be made in your chest or upper abdomen to create a pocket for the pacemaker.  The free end of the lead will be tunneled under the skin and attached to the pacemaker.  The transvenous or epicardial pacemaker will be tested. Imaging studies may be done to check the lead position.  The incisions will be closed with stitches (sutures), adhesive strips, or skin glue.  Bandages (dressings) will be placed over the incisions. The procedure may vary among health care providers and hospitals. What happens after the procedure?  Your blood pressure, heart rate, breathing rate, and blood oxygen level will be monitored until you leave the hospital or clinic.  You may be given antibiotics.  You will be given pain medicine.  An ECG and chest X-rays will be done.  You may need to wear a continuous type of ECG (Holter monitor) to check your heart rhythm.  Your health care provider will program the pacemaker.  If you were given a sedative during the procedure, it can affect you for several hours. Do not drive or operate machinery until your health care provider  says that it is safe.  You will be given a pacemaker identification card. This card lists the implant date, device model, and manufacturer of your pacemaker. Summary  A pacemaker is a small computer that sends electrical signals to the heart and helps the heart beat normally.  There are different types of pacemakers. A pacemaker may be placed under the skin or muscle of your chest or abdomen.  Follow instructions from your health care provider about eating and drinking and about taking medicines before the procedure. This information is not intended to replace advice given to you by your health care provider. Make sure you discuss any questions you have with your health care provider. Document Revised: 07/03/2019 Document Reviewed: 07/03/2019 Elsevier Patient Education  2021 [employer].

## 2020-12-31 NOTE — Progress Notes (Signed)
    HPI Mrs. Metoyer returns today for ongoing evaluation of uncontrolled atrial fibrillation.  She is a very pleasant 75-year-old woman who has had persistent uncontrolled atrial fibrillation.  She has been unable to maintain sinus rhythm.  She has been relegated to a strategy of rate control.  Over the past several months, she has had worsening atrial fibrillation despite up titration of beta-blocker therapy.  She is currently taking 200 mg of Toprol daily.  She has had problems with shortness of breath.  She has not missed her Xarelto.  The patient had been prescribed digoxin but her rates are not well controlled.  She has not had syncope.  She does not have much in the way of palpitations despite her rapid rates. Her main symptom is dyspnea with any exertion. Allergies  Allergen Reactions  . Bee Venom Anaphylaxis  . Ivp Dye [Iodinated Diagnostic Agents] Anaphylaxis  . Codeine Nausea And Vomiting  . Entresto [Sacubitril-Valsartan] Other (See Comments)    Chest pain   . Shrimp [Shellfish Allergy] Swelling  . Jardiance [Empagliflozin] Other (See Comments)    Caused Boils      Current Outpatient Medications  Medication Sig Dispense Refill  . acetaminophen (TYLENOL) 325 MG tablet Take 2 tablets (650 mg total) by mouth every 4 (four) hours as needed for headache or mild pain.    . albuterol (VENTOLIN HFA) 108 (90 Base) MCG/ACT inhaler Inhale 1-2 puffs into the lungs every 6 (six) hours as needed for wheezing or shortness of breath.    . allopurinol (ZYLOPRIM) 300 MG tablet Take 1 tablet (300 mg total) by mouth daily. 30 tablet 11  . atorvastatin (LIPITOR) 10 MG tablet Take 10 mg by mouth at bedtime.    . digoxin (LANOXIN) 0.125 MG tablet Take 0.5 tablets (0.0625 mg total) by mouth daily. 45 tablet 3  . losartan (COZAAR) 25 MG tablet Take 1 tablet (25 mg total) by mouth daily. 90 tablet 3  . metFORMIN (GLUCOPHAGE) 500 MG tablet Take 500 mg by mouth daily. Daily with a meal    . metoprolol  succinate (TOPROL-XL) 100 MG 24 hr tablet Take 1 tablet (100 mg total) by mouth 2 (two) times daily. 180 tablet 3  . pantoprazole (PROTONIX) 40 MG tablet Take 40 mg by mouth in the morning.    . rivaroxaban (XARELTO) 20 MG TABS tablet Take 1 tablet (20 mg total) by mouth daily with supper. Resume from 11/01/2019    . Tiotropium Bromide Monohydrate (SPIRIVA RESPIMAT) 2.5 MCG/ACT AERS Inhale 2 puffs into the lungs daily. 1 g 5  . torsemide (DEMADEX) 20 MG tablet TAKE TWO TABLETS BY MOUTH ONCE DAILY 60 tablet 10   No current facility-administered medications for this visit.     Past Medical History:  Diagnosis Date  . (HFimpEF) heart failure with improved ejection fraction (HCC)    a. 07/2018 Echo: EF 30-35%; b. 11/2019 Echo: EF 55-60%, no rwma, Gr2 DD, Nl RV size/fxn. Mild BAE. Mild MR/AI.  . Arthritis   . Asthma   . Atopic dermatitis   . CAD (coronary artery disease)    a. 2016 s/p CABG x 2 (LIMA->LAD, VG->OM); b. 08/2018 MV: EF 44%, no ischemia/infact.  . Cardiac arrest (HCC) 10/2014  . Cardiomyopathy, ischemic    a. 07/2018 Echo: EF 30-35%; 11/2019 Echo: EF 55-60%.  . Carotid arterial disease (HCC)    a. 11/2019 Carotid U/S  . CKD (chronic kidney disease), stage III (HCC)   . COPD (chronic obstructive pulmonary   disease) ([place])   . Esophageal dilatation 2013  . GERD (gastroesophageal reflux disease)   . Gout   . Headache   . Hypertension   . Hypokalemia   . Idiopathic angioedema   . LGI bleed 08/06/2017   a. felt to be hemorrhoidal during that admission (no drop in Hgb).  . Lower back pain   . Paroxysmal atrial fibrillation ([place])    a. Dx 2016-->h/o difficult to control rates (complicated by noncompliance), not felt to be a candidate for ablation or antiarrhythmic due to noncompliance; b. Recurrent AF 2021 - converted w/ IV dilt; c. CHA2DS2VASc = 7-->Xarelto.  . Personal history of noncompliance with medical treatment, presenting hazards to health   . Prediabetes   . S/P CABG x 2  with clipping of LA appendage 11/14/2014   LIMA to LAD, SVG to OM, EVH via right thigh  . Urine incontinence   . Uterine fibroid     ROS:   All systems reviewed and negative except as noted in the HPI.   Past Surgical History:  Procedure Laterality Date  . BALLOON DILATION N/A 10/10/2019   Procedure: BALLOON DILATION;  Surgeon: Otis Brace, MD;  Location: [facility];  Service: Gastroenterology;  Laterality: N/A;  . BIOPSY  10/10/2019   Procedure: BIOPSY;  Surgeon: Otis Brace, MD;  Location: [place];  Service: Gastroenterology;;  . BIOPSY  10/11/2019   Procedure: BIOPSY;  Surgeon: Otis Brace, MD;  Location: [place];  Service: Gastroenterology;;  . CARDIOVERSION N/A 11/18/2014   Procedure: CARDIOVERSION;  Surgeon: [name], MD;  Location: [place];  Service: Cardiovascular;  Laterality: N/A;  . CARDIOVERSION N/A 12/25/2017   Procedure: CARDIOVERSION;  Surgeon: [name], MD;  Location: [place];  Service: Cardiovascular;  Laterality: N/A;  . CLIPPING OF ATRIAL APPENDAGE N/A 11/14/2014   Procedure: CLIPPING OF ATRIAL APPENDAGE;  Surgeon: [name], MD;  Location: [place];  Service: Open Heart Surgery;  Laterality: N/A;  . COLONOSCOPY  2013  . COLONOSCOPY WITH PROPOFOL N/A 10/11/2019   Procedure: COLONOSCOPY WITH PROPOFOL;  Surgeon: Otis Brace, MD;  Location: [place];  Service: Gastroenterology;  Laterality: N/A;  . CORONARY ARTERY BYPASS GRAFT N/A 11/14/2014   Procedure: CORONARY ARTERY BYPASS GRAFTING (CABG)TIMES 2 USING LEFT INTERNAL MAMMARY ARTERY AND RIGHT SAPHENOUS VEIN HARVESTED ENDOSCOPICALLY;  Surgeon: [name], MD;  Location: [place];  Service: Open Heart Surgery;  Laterality: N/A;  . ESOPHAGOGASTRODUODENOSCOPY (EGD) WITH PROPOFOL N/A 10/10/2019   Procedure: ESOPHAGOGASTRODUODENOSCOPY (EGD) WITH PROPOFOL;  Surgeon: Otis Brace, MD;  [facility];  Service: Gastroenterology;  Laterality: N/A;  .  ESOPHAGOGASTRODUODENOSCOPY (EGD) WITH PROPOFOL N/A 10/27/2019   Procedure: ESOPHAGOGASTRODUODENOSCOPY (EGD) WITH PROPOFOL;  Surgeon: Ronnette Juniper, MD;  Location: [place];  Service: Gastroenterology;  Laterality: N/A;  . GIVENS CAPSULE STUDY N/A 10/27/2019   Procedure: GIVENS CAPSULE STUDY;  Surgeon: Ronnette Juniper, MD;  Location: [place];  Service: Gastroenterology;  Laterality: N/A;  . LEFT HEART CATHETERIZATION WITH CORONARY ANGIOGRAM N/A 11/04/2014   Procedure: LEFT HEART CATHETERIZATION WITH CORONARY ANGIOGRAM;  Surgeon: Troy Sine, MD;  Location: [facility] CATH LAB;  Service: Cardiovascular;  Laterality: N/A;  . POLYPECTOMY  10/11/2019   Procedure: POLYPECTOMY;  Surgeon: Otis Brace, MD;  Location: [place] [facility];  Service: Gastroenterology;;  . TEE WITHOUT CARDIOVERSION N/A 11/14/2014   Procedure: TRANSESOPHAGEAL ECHOCARDIOGRAM (TEE);  Surgeon: [name], MD;  Location: [place];  Service: Open Heart Surgery;  Laterality: N/A;  . TEE WITHOUT CARDIOVERSION N/A 12/25/2017  Procedure: TRANSESOPHAGEAL ECHOCARDIOGRAM (TEE);  Surgeon: [name], MD;  Location: [facility] ENDOSCOPY;  Service: Cardiovascular;  Laterality: N/A;  . TEMPORARY PACEMAKER INSERTION  11/04/2014   Procedure: TEMPORARY PACEMAKER INSERTION;  Surgeon: Troy Sine, MD;  Location: [facility] CATH LAB;  Service: Cardiovascular;;  . TOOTH EXTRACTION       Family History  Problem Relation Age of Onset  . Cancer Mother        LYMPHOMA  . Heart disease Father   . Other Father        TB  . CVA Sister   . Prostate cancer Brother 53  . Diabetes Brother      Social History   Socioeconomic History  . Marital status: Widowed    Spouse name: Not on file  . Number of children: 1  . Years of education: 80  . Highest education level: Not on file  Occupational History  . Occupation: RETIRED [employer]  Tobacco Use  . Smoking status: Current Every Day Smoker    Packs/day: 0.50    Years: 56.00    Pack years:  28.00    Types: Cigarettes  . Smokeless tobacco: Never Used  . Tobacco comment: smoking 2 cigarettes/day as of 10/23/20  Vaping Use  . Vaping Use: Never used  Substance and Sexual Activity  . Alcohol use: No    Alcohol/week: 0.0 standard drinks  . Drug use: No  . Sexual activity: Not on file  Other Topics Concern  . Not on file  Social History Narrative   Patient reports it being difficult to pay for everything due to outstanding medical bills from hospital stay last year but states she is able to keep up with basic expenses.  Patient does have some concerns with her house's condition due to a water leak- had her roof repaired last month but has some leaking in the front which has affected her porch.  Patient owns her own car and is able to drive herself but has some concerns about the reliability of her car- has gotten taxi to the clinic for appointment in the past when her car broke down.   Social Determinants of Health   Financial Resource Strain: Not on file  Food Insecurity: Not on file  Transportation Needs: Not on file  Physical Activity: Not on file  Stress: Not on file  Social Connections: Not on file  Intimate Partner Violence: Not on file     BP 102/66   Pulse (!) 112   Ht 5\' 7"  (1.702 m)   Wt 193 lb (87.5 kg)   SpO2 95%   BMI 30.23 kg/m   Physical Exam:  Well appearing NAD HEENT: Unremarkable Neck:  No JVD, no thyromegally Lymphatics:  No adenopathy Back:  No CVA tenderness Lungs:  Clear with no wheezes HEART:  Regular rate rhythm, no murmurs, no rubs, no clicks Abd:  soft, positive bowel sounds, no organomegally, no rebound, no guarding Ext:  2 plus pulses, no edema, no cyanosis, no clubbing Skin:  No rashes no nodules Neuro:  CN II through XII intact, motor grossly intact  EKG - atrial fib with a RVR  Assess/Plan: 1.  Uncontrolled atrial fibrillation.  The patient has worsening symptoms.  She has been taking her digoxin.  I recommended AV node  ablation and pacemaker insertion for rate control.  I have reviewed the indications/risks/benefits/goals/expectations of PPM insertion and she is willing to proceed. 2.  Chronic diastolic heart failure -her symptoms are class III in  atrial fibrillation.  Hopefully we can control her rates and regularize her rhythm either with additional AV nodal blocking drugs or pacemaker and AV node ablation.  She is encouraged to maintain a low-sodium diet.  Her blood pressure today is a bit soft.  Hopefully it will improve with improvement in her rate control. 3. Coags - she will hold her Xarelto for 2 days prior to the procedure. 4. CAD - she denies anginal symptoms. We will follow.  Carleene Overlie TaylorMD

## 2020-12-31 NOTE — H&P (View-Only) (Signed)
HPI [name] [name] returns today for ongoing evaluation of uncontrolled atrial fibrillation.  She is a very pleasant 75 year old woman who has had persistent uncontrolled atrial fibrillation.  She has been unable to maintain sinus rhythm.  She has been relegated to a strategy of rate control.  Over the past several months, she has had worsening atrial fibrillation despite up titration of beta-blocker therapy.  She is currently taking 200 mg of Toprol daily.  She has had problems with shortness of breath.  She has not missed her Xarelto.  The patient had been prescribed digoxin but her rates are not well controlled.  She has not had syncope.  She does not have much in the way of palpitations despite her rapid rates. Her main symptom is dyspnea with any exertion. Allergies  Allergen Reactions  . Bee Venom Anaphylaxis  . Ivp Dye [Iodinated Diagnostic Agents] Anaphylaxis  . Codeine Nausea And Vomiting  . Entresto [Sacubitril-Valsartan] Other (See Comments)    Chest pain   . Shrimp [Shellfish Allergy] Swelling  . Jardiance [Empagliflozin] Other (See Comments)    Caused Boils      Current Outpatient Medications  Medication Sig Dispense Refill  . acetaminophen (TYLENOL) 325 MG tablet Take 2 tablets (650 mg total) by mouth every 4 (four) hours as needed for headache or mild pain.    Marland Kitchen albuterol (VENTOLIN HFA) 108 (90 Base) MCG/ACT inhaler Inhale 1-2 puffs into the lungs every 6 (six) hours as needed for wheezing or shortness of breath.    . allopurinol (ZYLOPRIM) 300 MG tablet Take 1 tablet (300 mg total) by mouth daily. 30 tablet 11  . atorvastatin (LIPITOR) 10 MG tablet Take 10 mg by mouth at bedtime.    . digoxin (LANOXIN) 0.125 MG tablet Take 0.5 tablets (0.0625 mg total) by mouth daily. 45 tablet 3  . losartan (COZAAR) 25 MG tablet Take 1 tablet (25 mg total) by mouth daily. 90 tablet 3  . metFORMIN (GLUCOPHAGE) 500 MG tablet Take 500 mg by mouth daily. Daily with a meal    . metoprolol  succinate (TOPROL-XL) 100 MG 24 hr tablet Take 1 tablet (100 mg total) by mouth 2 (two) times daily. 180 tablet 3  . pantoprazole (PROTONIX) 40 MG tablet Take 40 mg by mouth in the morning.    . rivaroxaban (XARELTO) 20 MG TABS tablet Take 1 tablet (20 mg total) by mouth daily with supper. Resume from 11/01/2019    . Tiotropium Bromide Monohydrate (SPIRIVA RESPIMAT) 2.5 MCG/ACT AERS Inhale 2 puffs into the lungs daily. 1 g 5  . torsemide (DEMADEX) 20 MG tablet TAKE TWO TABLETS BY MOUTH ONCE DAILY 60 tablet 10   No current facility-administered medications for this visit.     Past Medical History:  Diagnosis Date  . (HFimpEF) heart failure with improved ejection fraction (HCC)    a. 07/2018 Echo: EF 30-35%; b. 11/2019 Echo: EF 55-60%, no rwma, Gr2 DD, Nl RV size/fxn. Mild BAE. Mild MR/AI.  Marland Kitchen Arthritis   . Asthma   . Atopic dermatitis   . CAD (coronary artery disease)    a. 2016 s/p CABG x 2 (LIMA->LAD, VG->OM); b. 08/2018 MV: EF 44%, no ischemia/infact.  . Cardiac arrest (HCC) 10/2014  . Cardiomyopathy, ischemic    a. 07/2018 Echo: EF 30-35%; 11/2019 Echo: EF 55-60%.  . Carotid arterial disease (HCC)    a. 11/2019 Carotid U/S  . CKD (chronic kidney disease), stage III (HCC)   . COPD (chronic obstructive pulmonary  disease) ([place])   . Esophageal dilatation 2013  . GERD (gastroesophageal reflux disease)   . Gout   . Headache   . Hypertension   . Hypokalemia   . Idiopathic angioedema   . LGI bleed 08/06/2017   a. felt to be hemorrhoidal during that admission (no drop in Hgb).  . Lower back pain   . Paroxysmal atrial fibrillation ([place])    a. Dx 2016-->h/o difficult to control rates (complicated by noncompliance), not felt to be a candidate for ablation or antiarrhythmic due to noncompliance; b. Recurrent AF 2021 - converted w/ IV dilt; c. CHA2DS2VASc = 7-->Xarelto.  . Personal history of noncompliance with medical treatment, presenting hazards to health   . Prediabetes   . S/P CABG x 2  with clipping of LA appendage 11/14/2014   LIMA to LAD, SVG to OM, EVH via right thigh  . Urine incontinence   . Uterine fibroid     ROS:   All systems reviewed and negative except as noted in the HPI.   Past Surgical History:  Procedure Laterality Date  . BALLOON DILATION N/A 10/10/2019   Procedure: BALLOON DILATION;  Surgeon: Otis Brace, MD;  Location: [facility];  Service: Gastroenterology;  Laterality: N/A;  . BIOPSY  10/10/2019   Procedure: BIOPSY;  Surgeon: Otis Brace, MD;  Location: [place];  Service: Gastroenterology;;  . BIOPSY  10/11/2019   Procedure: BIOPSY;  Surgeon: Otis Brace, MD;  Location: [place];  Service: Gastroenterology;;  . CARDIOVERSION N/A 11/18/2014   Procedure: CARDIOVERSION;  Surgeon: [name], MD;  Location: [place];  Service: Cardiovascular;  Laterality: N/A;  . CARDIOVERSION N/A 12/25/2017   Procedure: CARDIOVERSION;  Surgeon: [name], MD;  Location: [place];  Service: Cardiovascular;  Laterality: N/A;  . CLIPPING OF ATRIAL APPENDAGE N/A 11/14/2014   Procedure: CLIPPING OF ATRIAL APPENDAGE;  Surgeon: [name], MD;  Location: [place];  Service: Open Heart Surgery;  Laterality: N/A;  . COLONOSCOPY  2013  . COLONOSCOPY WITH PROPOFOL N/A 10/11/2019   Procedure: COLONOSCOPY WITH PROPOFOL;  Surgeon: Otis Brace, MD;  Location: [place];  Service: Gastroenterology;  Laterality: N/A;  . CORONARY ARTERY BYPASS GRAFT N/A 11/14/2014   Procedure: CORONARY ARTERY BYPASS GRAFTING (CABG)TIMES 2 USING LEFT INTERNAL MAMMARY ARTERY AND RIGHT SAPHENOUS VEIN HARVESTED ENDOSCOPICALLY;  Surgeon: [name], MD;  Location: [place];  Service: Open Heart Surgery;  Laterality: N/A;  . ESOPHAGOGASTRODUODENOSCOPY (EGD) WITH PROPOFOL N/A 10/10/2019   Procedure: ESOPHAGOGASTRODUODENOSCOPY (EGD) WITH PROPOFOL;  Surgeon: Otis Brace, MD;  [facility]: [facility];  Service: Gastroenterology;  Laterality: N/A;  .  ESOPHAGOGASTRODUODENOSCOPY (EGD) WITH PROPOFOL N/A 10/27/2019   Procedure: ESOPHAGOGASTRODUODENOSCOPY (EGD) WITH PROPOFOL;  Surgeon: Ronnette Juniper, MD;  Location: [place];  Service: Gastroenterology;  Laterality: N/A;  . GIVENS CAPSULE STUDY N/A 10/27/2019   Procedure: GIVENS CAPSULE STUDY;  Surgeon: Ronnette Juniper, MD;  Location: [place];  Service: Gastroenterology;  Laterality: N/A;  . LEFT HEART CATHETERIZATION WITH CORONARY ANGIOGRAM N/A 11/04/2014   Procedure: LEFT HEART CATHETERIZATION WITH CORONARY ANGIOGRAM;  Surgeon: Troy Sine, MD;  Location: [facility] CATH LAB;  Service: Cardiovascular;  Laterality: N/A;  . POLYPECTOMY  10/11/2019   Procedure: POLYPECTOMY;  Surgeon: Otis Brace, MD;  Location: [place] [facility];  Service: Gastroenterology;;  . TEE WITHOUT CARDIOVERSION N/A 11/14/2014   Procedure: TRANSESOPHAGEAL ECHOCARDIOGRAM (TEE);  Surgeon: [name], MD;  Location: [place];  Service: Open Heart Surgery;  Laterality: N/A;  . TEE WITHOUT CARDIOVERSION N/A 12/25/2017  Procedure: TRANSESOPHAGEAL ECHOCARDIOGRAM (TEE);  Surgeon: [name], MD;  Location: [facility] ENDOSCOPY;  Service: Cardiovascular;  Laterality: N/A;  . TEMPORARY PACEMAKER INSERTION  11/04/2014   Procedure: TEMPORARY PACEMAKER INSERTION;  Surgeon: Troy Sine, MD;  Location: [facility] CATH LAB;  Service: Cardiovascular;;  . TOOTH EXTRACTION       Family History  Problem Relation Age of Onset  . Cancer Mother        LYMPHOMA  . Heart disease Father   . Other Father        TB  . CVA Sister   . Prostate cancer Brother 53  . Diabetes Brother      Social History   Socioeconomic History  . Marital status: Widowed    Spouse name: Not on file  . Number of children: 1  . Years of education: 80  . Highest education level: Not on file  Occupational History  . Occupation: RETIRED [employer]  Tobacco Use  . Smoking status: Current Every Day Smoker    Packs/day: 0.50    Years: 56.00    Pack years:  28.00    Types: Cigarettes  . Smokeless tobacco: Never Used  . Tobacco comment: smoking 2 cigarettes/day as of 10/23/20  Vaping Use  . Vaping Use: Never used  Substance and Sexual Activity  . Alcohol use: No    Alcohol/week: 0.0 standard drinks  . Drug use: No  . Sexual activity: Not on file  Other Topics Concern  . Not on file  Social History Narrative   Patient reports it being difficult to pay for everything due to outstanding medical bills from hospital stay last year but states she is able to keep up with basic expenses.  Patient does have some concerns with her house's condition due to a water leak- had her roof repaired last month but has some leaking in the front which has affected her porch.  Patient owns her own car and is able to drive herself but has some concerns about the reliability of her car- has gotten taxi to the clinic for appointment in the past when her car broke down.   Social Determinants of Health   Financial Resource Strain: Not on file  Food Insecurity: Not on file  Transportation Needs: Not on file  Physical Activity: Not on file  Stress: Not on file  Social Connections: Not on file  Intimate Partner Violence: Not on file     BP 102/66   Pulse (!) 112   Ht 5\' 7"  (1.702 m)   Wt 193 lb (87.5 kg)   SpO2 95%   BMI 30.23 kg/m   Physical Exam:  Well appearing NAD HEENT: Unremarkable Neck:  No JVD, no thyromegally Lymphatics:  No adenopathy Back:  No CVA tenderness Lungs:  Clear with no wheezes HEART:  Regular rate rhythm, no murmurs, no rubs, no clicks Abd:  soft, positive bowel sounds, no organomegally, no rebound, no guarding Ext:  2 plus pulses, no edema, no cyanosis, no clubbing Skin:  No rashes no nodules Neuro:  CN II through XII intact, motor grossly intact  EKG - atrial fib with a RVR  Assess/Plan: 1.  Uncontrolled atrial fibrillation.  The patient has worsening symptoms.  She has been taking her digoxin.  I recommended AV node  ablation and pacemaker insertion for rate control.  I have reviewed the indications/risks/benefits/goals/expectations of PPM insertion and she is willing to proceed. 2.  Chronic diastolic heart failure -her symptoms are class III in  atrial fibrillation.  Hopefully we can control her rates and regularize her rhythm either with additional AV nodal blocking drugs or pacemaker and AV node ablation.  She is encouraged to maintain a low-sodium diet.  Her blood pressure today is a bit soft.  Hopefully it will improve with improvement in her rate control. 3. Coags - she will hold her Xarelto for 2 days prior to the procedure. 4. CAD - she denies anginal symptoms. We will follow.  Carleene Overlie TaylorMD

## 2021-01-13 ENCOUNTER — Telehealth: Payer: Self-pay | Admitting: Internal Medicine

## 2021-01-13 NOTE — Telephone Encounter (Signed)
Attempted to return call to Pt.  Unable to leave message.  Will try again.

## 2021-01-13 NOTE — Telephone Encounter (Signed)
Patient stated she is supposed to have surgery next Monday but things has been happening that she would like to discuss with the nurse/dr. Please advise

## 2021-01-14 NOTE — Telephone Encounter (Signed)
Returned call to Pt.  She confirmed her medication list.  Reiterated Pt should take her Xarelto tonight and 01/15/2021 her pm dose and then STOP until after her procedure.  Reiterated she should arrive to [facility] at 9:30 am on 01/18/21  Reiterated surgical scrub directions.  All questions answered.  Pt concerned about a fungal infection (reoccurring) under her breasts.  Advised that should not interfere with her procedure.

## 2021-01-14 NOTE — Telephone Encounter (Signed)
Patient states she needs to give the list of her medications to the nurse. She states she is about to get in the shower and to call her back in at least 15 minutes so she will hear the phone ring.

## 2021-01-15 NOTE — Pre-Procedure Instructions (Signed)
Instructed patient on the following items: Arrival time 0930 Nothing to eat or drink after midnight No meds AM of procedure Responsible person to drive you home and stay with you for 24 hrs  Have you missed any doses of anti-coagulant Xarelto- last dose 6/3

## 2021-01-18 ENCOUNTER — Other Ambulatory Visit: Payer: Self-pay

## 2021-01-18 ENCOUNTER — Observation Stay (HOSPITAL_COMMUNITY)
Admission: RE | Admit: 2021-01-18 | Discharge: 2021-01-19 | Disposition: A | Discharge status: Home / Self Care | Payer: HMO | Attending: Internal Medicine | Admitting: Internal Medicine

## 2021-01-18 ENCOUNTER — Observation Stay (HOSPITAL_COMMUNITY): Payer: HMO

## 2021-01-18 ENCOUNTER — Ambulatory Visit (HOSPITAL_COMMUNITY)
Admission: RE | Disposition: A | Discharge status: Home / Self Care | Payer: Self-pay | Source: Home / Self Care | Attending: Internal Medicine

## 2021-01-18 ENCOUNTER — Encounter (HOSPITAL_COMMUNITY): Payer: Self-pay | Admitting: Internal Medicine

## 2021-01-18 DIAGNOSIS — N183 Chronic kidney disease, stage 3 unspecified: Secondary | ICD-10-CM | POA: Diagnosis not present

## 2021-01-18 DIAGNOSIS — I4891 Unspecified atrial fibrillation: Secondary | ICD-10-CM | POA: Diagnosis not present

## 2021-01-18 DIAGNOSIS — J449 Chronic obstructive pulmonary disease, unspecified: Secondary | ICD-10-CM | POA: Insufficient documentation

## 2021-01-18 DIAGNOSIS — Z950 Presence of cardiac pacemaker: Secondary | ICD-10-CM

## 2021-01-18 DIAGNOSIS — Z79899 Other long term (current) drug therapy: Secondary | ICD-10-CM | POA: Diagnosis not present

## 2021-01-18 DIAGNOSIS — F17210 Nicotine dependence, cigarettes, uncomplicated: Secondary | ICD-10-CM | POA: Diagnosis not present

## 2021-01-18 DIAGNOSIS — R7303 Prediabetes: Secondary | ICD-10-CM | POA: Insufficient documentation

## 2021-01-18 DIAGNOSIS — Z951 Presence of aortocoronary bypass graft: Secondary | ICD-10-CM | POA: Insufficient documentation

## 2021-01-18 DIAGNOSIS — I130 Hypertensive heart and chronic kidney disease with heart failure and stage 1 through stage 4 chronic kidney disease, or unspecified chronic kidney disease: Secondary | ICD-10-CM | POA: Insufficient documentation

## 2021-01-18 DIAGNOSIS — J45909 Unspecified asthma, uncomplicated: Secondary | ICD-10-CM | POA: Diagnosis not present

## 2021-01-18 DIAGNOSIS — Z7984 Long term (current) use of oral hypoglycemic drugs: Secondary | ICD-10-CM | POA: Diagnosis not present

## 2021-01-18 DIAGNOSIS — Z7901 Long term (current) use of anticoagulants: Secondary | ICD-10-CM | POA: Diagnosis not present

## 2021-01-18 DIAGNOSIS — I4821 Permanent atrial fibrillation: Secondary | ICD-10-CM | POA: Diagnosis not present

## 2021-01-18 DIAGNOSIS — I5032 Chronic diastolic (congestive) heart failure: Secondary | ICD-10-CM | POA: Diagnosis not present

## 2021-01-18 DIAGNOSIS — I2510 Atherosclerotic heart disease of native coronary artery without angina pectoris: Secondary | ICD-10-CM | POA: Insufficient documentation

## 2021-01-18 DIAGNOSIS — I4819 Other persistent atrial fibrillation: Secondary | ICD-10-CM | POA: Diagnosis present

## 2021-01-18 DIAGNOSIS — I517 Cardiomegaly: Secondary | ICD-10-CM | POA: Diagnosis not present

## 2021-01-18 HISTORY — PX: PACEMAKER IMPLANT: EP1218

## 2021-01-18 HISTORY — PX: AV NODE ABLATION: EP1193

## 2021-01-18 LAB — GLUCOSE, CAPILLARY
Glucose-Capillary: 109 mg/dL — ABNORMAL HIGH (ref 70–99)
Glucose-Capillary: 156 mg/dL — ABNORMAL HIGH (ref 70–99)

## 2021-01-18 SURGERY — AV NODE ABLATION

## 2021-01-18 MED ORDER — FUROSEMIDE 10 MG/ML IJ SOLN
INTRAMUSCULAR | Status: AC
  Filled 2021-01-18: qty 4

## 2021-01-18 MED ORDER — HEPARIN (PORCINE) IN NACL 1000-0.9 UT/500ML-% IV SOLN
INTRAVENOUS | Status: AC
  Filled 2021-01-18: qty 500

## 2021-01-18 MED ORDER — CEFAZOLIN SODIUM-DEXTROSE 1-4 GM/50ML-% IV SOLN
1.0000 g | Freq: Four times a day (QID) | INTRAVENOUS | Status: AC
  Administered 2021-01-18 – 2021-01-19 (×3): 1 g via INTRAVENOUS
  Filled 2021-01-18 (×4): qty 50

## 2021-01-18 MED ORDER — LIDOCAINE HCL (PF) 1 % IJ SOLN
INTRAMUSCULAR | Status: DC | PRN
  Administered 2021-01-18: 45 mL
  Administered 2021-01-18: 10 mL

## 2021-01-18 MED ORDER — FENTANYL CITRATE (PF) 100 MCG/2ML IJ SOLN
INTRAMUSCULAR | Status: DC | PRN
  Administered 2021-01-18 (×3): 12.5 ug via INTRAVENOUS

## 2021-01-18 MED ORDER — UMECLIDINIUM BROMIDE 62.5 MCG/INH IN AEPB
1.0000 | INHALATION_SPRAY | Freq: Every day | RESPIRATORY_TRACT | Status: DC
  Administered 2021-01-19: 1 via RESPIRATORY_TRACT
  Filled 2021-01-18: qty 7

## 2021-01-18 MED ORDER — METHYLPREDNISOLONE SODIUM SUCC 125 MG IJ SOLR
62.5000 mg | Freq: Once | INTRAMUSCULAR | Status: AC
  Administered 2021-01-18: 62.5 mg via INTRAVENOUS
  Filled 2021-01-18: qty 2

## 2021-01-18 MED ORDER — MIDAZOLAM HCL 5 MG/5ML IJ SOLN
INTRAMUSCULAR | Status: AC
  Filled 2021-01-18: qty 5

## 2021-01-18 MED ORDER — CEFAZOLIN SODIUM-DEXTROSE 2-4 GM/100ML-% IV SOLN
2.0000 g | INTRAVENOUS | Status: AC
  Administered 2021-01-18: 2 g via INTRAVENOUS

## 2021-01-18 MED ORDER — FENTANYL CITRATE (PF) 100 MCG/2ML IJ SOLN
INTRAMUSCULAR | Status: AC
  Filled 2021-01-18: qty 2

## 2021-01-18 MED ORDER — LIDOCAINE HCL 1 % IJ SOLN
INTRAMUSCULAR | Status: AC
  Filled 2021-01-18: qty 60

## 2021-01-18 MED ORDER — CEFAZOLIN SODIUM-DEXTROSE 2-4 GM/100ML-% IV SOLN
INTRAVENOUS | Status: AC
  Filled 2021-01-18: qty 100

## 2021-01-18 MED ORDER — LOSARTAN POTASSIUM 25 MG PO TABS
25.0000 mg | ORAL_TABLET | Freq: Every day | ORAL | Status: DC
  Administered 2021-01-18 – 2021-01-19 (×2): 25 mg via ORAL
  Filled 2021-01-18 (×2): qty 1

## 2021-01-18 MED ORDER — DIPHENHYDRAMINE HCL 50 MG/ML IJ SOLN
25.0000 mg | Freq: Once | INTRAMUSCULAR | Status: AC
  Administered 2021-01-18: 25 mg via INTRAVENOUS
  Filled 2021-01-18: qty 1

## 2021-01-18 MED ORDER — ACETAMINOPHEN 325 MG PO TABS: 325.0000 mg | ORAL_TABLET | ORAL | Status: DC | PRN

## 2021-01-18 MED ORDER — POVIDONE-IODINE 10 % EX SWAB: 2.0000 "application " | Freq: Once | CUTANEOUS | Status: DC

## 2021-01-18 MED ORDER — MIDAZOLAM HCL 2 MG/2ML IJ SOLN
INTRAMUSCULAR | Status: AC
  Filled 2021-01-18: qty 2

## 2021-01-18 MED ORDER — MIDAZOLAM HCL 5 MG/5ML IJ SOLN
INTRAMUSCULAR | Status: DC | PRN
  Administered 2021-01-18 (×2): 1 mg via INTRAVENOUS

## 2021-01-18 MED ORDER — TIOTROPIUM BROMIDE MONOHYDRATE 2.5 MCG/ACT IN AERS: 2.0000 | INHALATION_SPRAY | Freq: Every day | RESPIRATORY_TRACT | Status: DC

## 2021-01-18 MED ORDER — TORSEMIDE 20 MG PO TABS
40.0000 mg | ORAL_TABLET | Freq: Every day | ORAL | Status: DC
  Administered 2021-01-19: 40 mg via ORAL
  Filled 2021-01-18: qty 2

## 2021-01-18 MED ORDER — SODIUM CHLORIDE 0.9 % IV SOLN
80.0000 mg | INTRAVENOUS | Status: AC
  Administered 2021-01-18: 80 mg
  Filled 2021-01-18: qty 2

## 2021-01-18 MED ORDER — BUPIVACAINE HCL (PF) 0.25 % IJ SOLN
INTRAMUSCULAR | Status: AC
  Filled 2021-01-18: qty 30

## 2021-01-18 MED ORDER — SODIUM CHLORIDE 0.9 % IV SOLN
INTRAVENOUS | Status: AC
  Filled 2021-01-18: qty 2

## 2021-01-18 MED ORDER — FUROSEMIDE 10 MG/ML IJ SOLN
INTRAMUSCULAR | Status: DC | PRN
  Administered 2021-01-18: 80 mg via INTRAVENOUS

## 2021-01-18 MED ORDER — CHLORHEXIDINE GLUCONATE 4 % EX LIQD
4.0000 "application " | Freq: Once | CUTANEOUS | Status: DC
  Filled 2021-01-18: qty 60

## 2021-01-18 MED ORDER — HEPARIN (PORCINE) IN NACL 1000-0.9 UT/500ML-% IV SOLN
INTRAVENOUS | Status: DC | PRN
  Administered 2021-01-18: 500 mL

## 2021-01-18 MED ORDER — METFORMIN HCL 500 MG PO TABS
500.0000 mg | ORAL_TABLET | Freq: Every day | ORAL | Status: DC
  Administered 2021-01-19: 500 mg via ORAL
  Filled 2021-01-18: qty 1

## 2021-01-18 MED ORDER — ONDANSETRON HCL 4 MG/2ML IJ SOLN: 4.0000 mg | Freq: Four times a day (QID) | INTRAMUSCULAR | Status: DC | PRN

## 2021-01-18 MED ORDER — SODIUM CHLORIDE 0.9 % IV SOLN: INTRAVENOUS | Status: DC

## 2021-01-18 MED ORDER — ACETAMINOPHEN 325 MG PO TABS: 650.0000 mg | ORAL_TABLET | ORAL | Status: DC | PRN

## 2021-01-18 SURGICAL SUPPLY — 13 items
CABLE SURGICAL S-101-97-12 (CABLE) ×2 IMPLANT
CATH CELSIUS THERMO F CV 7FR (ABLATOR) ×1 IMPLANT
KIT ACCESSORY SELECTRA FIX CVD (MISCELLANEOUS) ×1 IMPLANT
LEAD SELECTRA 3D-65-42 (CATHETERS) ×1 IMPLANT
LEAD SOLIA S PRO MRI 60 (Lead) ×1 IMPLANT
PACEMAKER EDORA 8DR-T MRI (Pacemaker) ×1 IMPLANT
PACK EP LATEX FREE (CUSTOM PROCEDURE TRAY) ×2
PACK EP LF (CUSTOM PROCEDURE TRAY) ×1 IMPLANT
PAD PRO RADIOLUCENT 2001M-C (PAD) ×2 IMPLANT
SHEATH 9FR PRELUDE SNAP 13 (SHEATH) ×1 IMPLANT
SHEATH PINNACLE 8F 10CM (SHEATH) ×2 IMPLANT
TRAY PACEMAKER INSERTION (PACKS) ×2 IMPLANT
WIRE HI TORQ VERSACORE-J 145CM (WIRE) ×1 IMPLANT

## 2021-01-18 NOTE — Progress Notes (Addendum)
SITE AREA: right femoral/groin  SITE PRIOR TO REMOVAL:  LEVEL 0  PRESSURE APPLIED FOR: approximately 20 minutes  MANUAL: yes  PATIENT STATUS DURING PULL: eyes closed, argumentative at times  POST PULL SITE:  LEVEL 0  POST PULL INSTRUCTIONS GIVEN: yes  POST PULL PULSES PRESENT: bilateral pedal pulses at +1  DRESSING APPLIED: 4x4 gauze with tegaderm  BEDREST BEGINS @ 1525   COMMENTS: pt uncooperative, Dr. Lovena Le to bedside, removed all sheaths to right groin, S. Emylee Decelle, RN  took over manual pull from Dr. Lovena Le at approximately 15 minutes after sheaths removed, safety maintained

## 2021-01-18 NOTE — Progress Notes (Addendum)
Dr. Lovena Le at bedside, made aware pt right groin bleeding, manual pressure held for approximately 5 minutes, re-start bedrest at 1740, pt also removed PIV to right hand, iv re-started, see flowsheet, pt given instruction, calling RN a "bitch", says everyone has been nice but this one, and wants LUE removed from sling, safety maintained, support given by Dr. Lovena Le, pt requests water, given, + coughing noted

## 2021-01-18 NOTE — Discharge Instructions (Signed)
GROIN care instructions No lifting over 5 lbs for 1 week. No vigorous or sexual activity for 1 week. Keep procedure site clean & dry. If you notice increased pain, swelling, bleeding or pus, call/return!    PLEASE ALSO NOTE instructions below     Supplemental Discharge Instructions for  Pacemaker/Defibrillator Patients   Activity No heavy lifting or vigorous activity with your left/right arm for 6 to 8 weeks.  Do not raise your left/right arm above your head for one week.  Gradually raise your affected arm as drawn below.             01/26/21                     01/27/21                     01/28/21                 01/29/21 __  NO DRIVING until cleared to at your wound check visit.  WOUND CARE - Keep the wound area clean and dry.  Do not get this area wet , no showers for one week; you may shower on     . - Tomorrow, 01/19/21, remove the arm sling  Tomorrow, 01/19/21 remove the LARGE outer plastic bandage.  Underneath the plastic bandage there are steri strips (paper tapes), DO NOT remove these. - The tape/steri-strips on your wound will fall off; do not pull them off.  No bandage is needed on the site.  DO  NOT apply any creams, oils, or ointments to the wound area. - If you notice any drainage or discharge from the wound, any swelling or bruising at the site, or you develop a fever > 101? F after you are discharged home, call the office at once.  Special Instructions - You are still able to use cellular telephones; use the ear opposite the side where you have your pacemaker/defibrillator.  Avoid carrying your cellular phone near your device. - When traveling through airports, show security personnel your identification card to avoid being screened in the metal detectors.  Ask the security personnel to use the hand wand. - Avoid arc welding equipment, MRI testing (magnetic resonance imaging), TENS units (transcutaneous nerve stimulators).  Call the office for questions about other  devices. - Avoid electrical appliances that are in poor condition or are not properly grounded. - Microwave ovens are safe to be near or to operate.

## 2021-01-18 NOTE — Progress Notes (Signed)
Pt voices concerns about procedure and wants to speak with Dr Lovena Le before proceeding.   Pt appears to be SOB. Sats 100% on R/A.  Pt states this is her normal and she even has trouble walking to and from her car.  Pt states she has no transportation home but does have a friend to stay with her if she can get home.  Relayed information to Lovena Le who is coming to speak with pt and answer her questions.

## 2021-01-18 NOTE — Progress Notes (Signed)
Pt transported to cath lab without incident. HR 112. Sats 98 RA

## 2021-01-18 NOTE — Progress Notes (Signed)
RN assumed care of pt, received report from [place], [place], Dr. Lovena Le at bedside and removed sheaths from right groin, New PIV placed to right hand by S. Kendle Erker, RN, pt able to answer questions appropriately, but will not follow commands to keep head on pillow and rle straight, attempted to maintain safety

## 2021-01-18 NOTE — Progress Notes (Signed)
Pt has had her premeds and is ready for procedure.  She appears to be increasingly SOB and agitated. IVF decreased to 10cc hr.  Oxygen sats still 98 on RA.  Pt has labored breathing when awake but much less when asleep.  Dr Lovena Le notified and coming to access pt.  Pt placed on telemetry which shows AFib and a HR of 133.  BP elevated at 155/100

## 2021-01-18 NOTE — Interval H&P Note (Signed)
History and Physical Interval Note:  01/18/2021 12:11 PM  [name] [name]  has presented today for surgery, with the diagnosis of bradycardia, tachycardia.  The various methods of treatment have been discussed with the patient and family. After consideration of risks, benefits and other options for treatment, the patient has consented to  Procedure(s): AV NODE ABLATION (N/A) PACEMAKER IMPLANT (N/A) as a surgical intervention.  [name] history has been reviewed, patient examined, no change in status, stable for surgery.  I have reviewed the patient's chart and labs.  Questions were answered to the patient's satisfaction.     [name] [name]

## 2021-01-18 NOTE — Progress Notes (Signed)
In and Out cath completed per order with about 375 cc of light amber urine noted for return, sterility maintained, RN and NT Tech at bedside, pt continues to lift head from pillow and move bilateral lower extremities, pt states her left foot itches and get Dr. Lovena Le, rubbed by NT Tech, pt states she doesn't care and she will go home, support and instructions given, safety maintained

## 2021-01-19 DIAGNOSIS — J45909 Unspecified asthma, uncomplicated: Secondary | ICD-10-CM | POA: Diagnosis not present

## 2021-01-19 DIAGNOSIS — I4819 Other persistent atrial fibrillation: Secondary | ICD-10-CM

## 2021-01-19 DIAGNOSIS — R7303 Prediabetes: Secondary | ICD-10-CM | POA: Diagnosis not present

## 2021-01-19 DIAGNOSIS — Z79899 Other long term (current) drug therapy: Secondary | ICD-10-CM | POA: Diagnosis not present

## 2021-01-19 DIAGNOSIS — N183 Chronic kidney disease, stage 3 unspecified: Secondary | ICD-10-CM | POA: Diagnosis not present

## 2021-01-19 DIAGNOSIS — I2510 Atherosclerotic heart disease of native coronary artery without angina pectoris: Secondary | ICD-10-CM | POA: Diagnosis not present

## 2021-01-19 DIAGNOSIS — F17210 Nicotine dependence, cigarettes, uncomplicated: Secondary | ICD-10-CM | POA: Diagnosis not present

## 2021-01-19 DIAGNOSIS — J449 Chronic obstructive pulmonary disease, unspecified: Secondary | ICD-10-CM | POA: Diagnosis not present

## 2021-01-19 DIAGNOSIS — I130 Hypertensive heart and chronic kidney disease with heart failure and stage 1 through stage 4 chronic kidney disease, or unspecified chronic kidney disease: Secondary | ICD-10-CM | POA: Diagnosis not present

## 2021-01-19 DIAGNOSIS — Z951 Presence of aortocoronary bypass graft: Secondary | ICD-10-CM | POA: Diagnosis not present

## 2021-01-19 DIAGNOSIS — I4821 Permanent atrial fibrillation: Secondary | ICD-10-CM | POA: Diagnosis not present

## 2021-01-19 DIAGNOSIS — Z7984 Long term (current) use of oral hypoglycemic drugs: Secondary | ICD-10-CM | POA: Diagnosis not present

## 2021-01-19 DIAGNOSIS — I5032 Chronic diastolic (congestive) heart failure: Secondary | ICD-10-CM | POA: Diagnosis not present

## 2021-01-19 MED ORDER — CARVEDILOL 25 MG PO TABS: 25.0000 mg | ORAL_TABLET | Freq: Two times a day (BID) | ORAL | 6 refills | Status: DC

## 2021-01-19 MED FILL — Bupivacaine HCl Preservative Free (PF) Inj 0.25%: INTRAMUSCULAR | Qty: 30 | Status: AC

## 2021-01-19 MED FILL — Lidocaine HCl Local Inj 1%: INTRAMUSCULAR | Qty: 55 | Status: AC

## 2021-01-19 MED FILL — Midazolam HCl Inj 2 MG/2ML (Base Equivalent): INTRAMUSCULAR | Qty: 2 | Status: AC

## 2021-01-19 MED FILL — Fentanyl Citrate Preservative Free (PF) Inj 100 MCG/2ML: INTRAMUSCULAR | Qty: 2 | Status: AC

## 2021-01-19 NOTE — Progress Notes (Addendum)
Patient appears to go in and out of confusion with moments of hallucinations. Patient does acknowledge at times that she is hallucinating.  When asked orientation questions patient continues to be able to identify self, place, and time.  Patient restless and continues to remove her arm from sling even after repeated education on importance.  VSS.  Pacemaker and groin site unchanged. MD notified.   0300-arm continues to be removed from sling by patient. Sling taken off and arm is propped on pillow.

## 2021-01-19 NOTE — Discharge Summary (Addendum)
ELECTROPHYSIOLOGY PROCEDURE DISCHARGE SUMMARY    Patient ID: [name] [name],  MRN: [id number], DOB/AGE: 76-Aug-1947 76 y.o.  Admit date: 01/18/2021 Discharge date: 01/19/2021  Primary Care Physician: [name], MD  Primary Cardiologist: [name] Electrophysiologist: Dr. Lovena Le  Primary Discharge Diagnosis:  1. Permanent AFib uncontrolled S/p PPM implant and AV node ablation  Secondary Discharge Diagnosis:  1. CAD     H/o CABG 2. COPD 3. CKD (III) 4. Chronic CHF (diastolic) 5. HTN  Allergies  Allergen Reactions   Bee Venom Anaphylaxis   Ivp Dye [Iodinated Diagnostic Agents] Anaphylaxis   Codeine Nausea And Vomiting   Entresto [Sacubitril-Valsartan] Other (See Comments)    Chest pain    Shrimp [Shellfish Allergy] Swelling   Jardiance [Empagliflozin] Other (See Comments)    Caused Boils      Procedures This Admission:  1 Implantation of a Bi[employer]ik dual chamber PPM on 01/18/21 by Dr Lovena Le.  The patient received  Biotronik (serial number [id number]) pacemaker,  [employer] (serial number [id number]) right ventricular lead  There were no immediate post procedure complications. CXR on 01/19/21 demonstrated no pneumothorax status post device implantation.  2.CONCLUSIONS:   1. Successful implantation of a Biotronik dual-chamber pacemaker followed by AV node ablation for uncontrolled atrial fib.   2. No early apparent complications.    Brief HPI: [name] [name] is a 75 y.o. female is followed by electrophysiology in the outpatient setting for her AFib which had developed to permanent AFib and difficult to control HRs, recommended PPM implantation and AV node ablation.  Past medical history includes above.  T Risks, benefits, and alternatives to PPM implantation were reviewed with the patient who wished to proceed.   Hospital Course:  The patient was admitted given pre-med for contrast allergy after which she became somewhat confused, agitated especially, this was felt to be  2/2 benadryl, and pcoceeded with her procedures as planned, post procedure remained intermittently agitated and confused this again felt to be 2/2 sedation/medications.  She underwent implantation of a PPM with details as outlined above as well as an AV Node ablation.  She was reported to have some intermittent hallucinations and some SOB placed on O2.  Her CXR this morning without monitored on telemetry overnight which demonstrated AFib V pacing at 90.  Left chest was without hematoma or ecchymosis.  The device was interrogated and found to be functioning normally.  CXR was obtained and demonstrated no pneumothorax and no acute findings, no effusions, edema. The patien this morning was appologetic for her behavior yesterday and spoke at length this morning with Dr. Lovena Le, was appropriate and clear. O2 was stopped maintaining O2 sats 98-99% Wound care, arm mobility, and restrictions were reviewed with the patient.  The patient feels well, denies any CP/SOB, with  minimal site discomfort.   She was examined by Dr. Lovena Le and considered stable for discharge to home.   The patient in d/w short stay staff yesterday arrived to the hospital somewhat weak, lethargic and overnight with reports of somee hallucinations. The patient reports to me that she has felt this way now several months and was thinking perhaps one of her medicines may be causing this. She has an appointment with her PMD tomorrow.  We will stop her digoxin and change her Toprol to coreg I    Physical Exam: Vitals:   01/19/21 0000 01/19/21 0346 01/19/21 0719 01/19/21 0752  BP: (!) 141/93 125/89 (!) 137/92   Pulse: 89 89 89   Resp: Marland Kitchen)  24 (!) 21 (!) 22   Temp: (!) 97.5 F (36.4 C) 97.6 F (36.4 C) 97.9 F (36.6 C)   TempSrc: Oral Oral Oral   SpO2: 97% 99% 100% 97%  Weight:  89.8 kg    Height:        GEN- The patient is well appearing, alert and oriented x 3 today.   HEENT: normocephalic, atraumatic; sclera clear, conjunctiva  pink; hearing intact; oropharynx clear; neck supple, no JVP Lungs- CTA b/l, normal work of breathing.  No wheezes, rales, rhonchi Heart- RRR, no murmurs, rubs or gallops, PMI not laterally displaced GI- soft, non-tender, non-distended Extremities- no clubbing, cyanosis, or edema, R groin is soft, nontender no bleeding or hematoma MS- no significant deformity or atrophy Skin- warm and dry, no rash or lesion, left chest without hematoma/ecchymosis Psych- euthymic mood, full affect Neuro- no gross deficits   Labs:   Lab Results  Component Value Date   WBC 6.7 12/31/2020   HGB 15.5 12/31/2020   HCT 48.0 (H) 12/31/2020   MCV 84 12/31/2020   PLT 144 (L) 12/31/2020   No results for input(s): NA, K, CL, CO2, BUN, CREATININE, CALCIUM, PROT, BILITOT, ALKPHOS, ALT, AST, GLUCOSE in the last 168 hours.  Invalid input(s): LABALBU  Discharge Medications:  Allergies as of 01/19/2021       Reactions   Bee Venom Anaphylaxis   Ivp Dye [iodinated Diagnostic Agents] Anaphylaxis   Codeine Nausea And Vomiting   Entresto [sacubitril-valsartan] Other (See Comments)   Chest pain    Shrimp [shellfish Allergy] Swelling   Jardiance [empagliflozin] Other (See Comments)   Caused Boils        Medication List     STOP taking these medications    digoxin 0.125 MG tablet Commonly known as: LANOXIN   metoprolol succinate 100 MG 24 hr tablet Commonly known as: TOPROL-XL       TAKE these medications    acetaminophen 325 MG tablet Commonly known as: TYLENOL Take 2 tablets (650 mg total) by mouth every 4 (four) hours as needed for headache or mild pain.   allopurinol 300 MG tablet Commonly known as: ZYLOPRIM Take 1 tablet (300 mg total) by mouth daily.   carvedilol 25 MG tablet Commonly known as: Coreg Take 1 tablet (25 mg total) by mouth 2 (two) times daily. Notes to patient: You can continue your metoprolol at home as usual until you pick up this new medicine. Do not take both.  Once you  have the carvedilol at home, stop taking metoprolol.   ferrous sulfate 325 (65 FE) MG tablet Take 162.5 mg by mouth daily with breakfast.   losartan 25 MG tablet Commonly known as: COZAAR Take 1 tablet (25 mg total) by mouth daily.   metFORMIN 500 MG tablet Commonly known as: GLUCOPHAGE Take 500 mg by mouth daily with breakfast. Daily with a meal   pantoprazole 40 MG tablet Commonly known as: PROTONIX Take 40 mg by mouth in the morning.   rivaroxaban 20 MG Tabs tablet Commonly known as: XARELTO Take 1 tablet (20 mg total) by mouth daily with supper. Resume from 11/01/2019 Notes to patient: Do NOT resume until Saturday 01/23/21   Spiriva Respimat 2.5 MCG/ACT Aers Generic drug: Tiotropium Bromide Monohydrate Inhale 2 puffs into the lungs daily. What changed:  when to take this reasons to take this   torsemide 20 MG tablet Commonly known as: DEMADEX TAKE TWO TABLETS BY MOUTH ONCE DAILY        Disposition:  Discharge Instructions     Diet - low sodium heart healthy   Complete by: As directed    Increase activity slowly   Complete by: As directed        Follow-up Information     [facility] Follow up.   Specialty: Cardiology Why: 01/28/21 @ 12:00PM (noon), wound check visit Contact information: [address], [address] [place]        Shirley Friar, PA-C Follow up.   Specialty: Physician Assistant Why: 02/16/21 @ 12:00PM (noon), pacemaker check and routine programming Contact information: [address] [place] [address] [phone]         Evans Lance, MD Follow up.   Specialty: Cardiology Why: 04/30/21 @ 12:00PM (noon) Contact information: [address] [place] [address] [phone]         [name], MD. Daphane Shepherd on 01/20/2021.   Specialty: Internal Medicine Why: @3 :30pm Contact information: [address] [place] [address] [phone]                 Duration of Discharge Encounter: Greater than 30 minutes including physician time.  Venetia Night, PA-C 01/19/2021 10:29 AM  EP Attending  Patient seen and examined. PPM interrogation under my direction demonstrates normal single chamber PM function. She is pacing 99%.  She will be discharged with usual followup. She will hold her [place] for 4 days. I have adjusted her bp meds and stopped the digoxin.  Carleene Overlie Nicky Kras,MD

## 2021-01-19 NOTE — Progress Notes (Signed)
Patient will be discharging with family later this morning. Patient's belongings were returned to her. Education on medications will be provided.

## 2021-01-19 NOTE — Progress Notes (Signed)
D/C instructions given and reviewed. Family on the way to transport home.

## 2021-01-28 ENCOUNTER — Other Ambulatory Visit: Payer: Self-pay

## 2021-01-28 ENCOUNTER — Ambulatory Visit (INDEPENDENT_AMBULATORY_CARE_PROVIDER_SITE_OTHER): Payer: HMO

## 2021-01-28 ENCOUNTER — Telehealth: Payer: Self-pay

## 2021-01-28 DIAGNOSIS — I4589 Other specified conduction disorders: Secondary | ICD-10-CM

## 2021-01-28 LAB — CUP PACEART INCLINIC DEVICE CHECK
Date Time Interrogation Session: 20220616121945
Implantable Lead Implant Date: 20220606
Implantable Lead Location: 753860
Implantable Lead Model: 377169
Implantable Lead Serial Number: 8000396500
Implantable Pulse Generator Implant Date: 20220606
Lead Channel Impedance Value: 351 Ohm
Lead Channel Pacing Threshold Amplitude: 0.5 V
Lead Channel Pacing Threshold Pulse Width: 0.4 ms
Lead Channel Sensing Intrinsic Amplitude: 9 mV
Lead Channel Setting Pacing Amplitude: 3 V
Lead Channel Setting Pacing Pulse Width: 0.4 ms
Pulse Gen Model: 407145
Pulse Gen Serial Number: 70105656

## 2021-01-28 NOTE — Telephone Encounter (Signed)
Patient was seen in device clinic today and writer noted patient did not have home remote monitor linked in [place]. Called Adam with [employer] and he states patient was given a monitor in the hospital and education provided. Patient states the monitor may be in her patient belongings bag she has not checked. Advised patient to please look for it and if she has it to plug it in beside where she sleeps at night within 4-6 feet and have nothing between patient and monitor. Advised if she is not able to locate it to please call the device clinic so we can get her a new monitor. Direct phone number provided. Patient verbalized understanding.

## 2021-01-28 NOTE — Progress Notes (Signed)
"  After Your Pacemaker" education paper reviewed and given to patient with verbal understanding.  Wound check appointment. Steri-strips removed. Wound without redness or edema. Incision edges approximated, wound well healed. Normal device function. Thresholds, sensing, and impedances consistent with implant measurements. Device programmed at 3.0V/auto capture programmed on for extra safety margin until 3 month visit. Histogram distribution appropriate for patient and level of activity. No high ventricular rates noted. Patient educated about wound care, arm mobility, lifting restrictions. ROV in 02/16/21 to see A. Tillery, PA for follow up, also 3 months with implanting physician.

## 2021-02-03 DIAGNOSIS — I10 Essential (primary) hypertension: Secondary | ICD-10-CM | POA: Diagnosis not present

## 2021-02-03 DIAGNOSIS — N183 Chronic kidney disease, stage 3 unspecified: Secondary | ICD-10-CM | POA: Diagnosis not present

## 2021-02-03 DIAGNOSIS — M1990 Unspecified osteoarthritis, unspecified site: Secondary | ICD-10-CM | POA: Diagnosis not present

## 2021-02-03 DIAGNOSIS — H269 Unspecified cataract: Secondary | ICD-10-CM | POA: Diagnosis not present

## 2021-02-03 DIAGNOSIS — I2510 Atherosclerotic heart disease of native coronary artery without angina pectoris: Secondary | ICD-10-CM | POA: Diagnosis not present

## 2021-02-03 DIAGNOSIS — E7800 Pure hypercholesterolemia, unspecified: Secondary | ICD-10-CM | POA: Diagnosis not present

## 2021-02-03 DIAGNOSIS — K219 Gastro-esophageal reflux disease without esophagitis: Secondary | ICD-10-CM | POA: Diagnosis not present

## 2021-02-03 DIAGNOSIS — M8580 Other specified disorders of bone density and structure, unspecified site: Secondary | ICD-10-CM | POA: Diagnosis not present

## 2021-02-03 DIAGNOSIS — I509 Heart failure, unspecified: Secondary | ICD-10-CM | POA: Diagnosis not present

## 2021-02-03 DIAGNOSIS — I4891 Unspecified atrial fibrillation: Secondary | ICD-10-CM | POA: Diagnosis not present

## 2021-02-03 DIAGNOSIS — I214 Non-ST elevation (NSTEMI) myocardial infarction: Secondary | ICD-10-CM | POA: Diagnosis not present

## 2021-02-03 DIAGNOSIS — J449 Chronic obstructive pulmonary disease, unspecified: Secondary | ICD-10-CM | POA: Diagnosis not present

## 2021-02-04 ENCOUNTER — Telehealth: Payer: Self-pay | Admitting: Internal Medicine

## 2021-02-04 MED ORDER — METOPROLOL SUCCINATE ER 100 MG PO TB24: 100.0000 mg | ORAL_TABLET | Freq: Two times a day (BID) | ORAL | 3 refills | Status: DC

## 2021-02-04 NOTE — Telephone Encounter (Signed)
     Pt c/o medication issue:  1. Name of Medication: carvedilol (COREG) 25 MG tablet  2. How are you currently taking this medication (dosage and times per day)? Take 1 tablet (25 mg total) by mouth 2 (two) times daily.  3. Are you having a reaction (difficulty breathing--STAT)?   4. What is your medication issue? Pt said she is having severe headache with this med. She wanted to ask Dr. Lovena Le to put her back on metoprolol

## 2021-02-04 NOTE — Telephone Encounter (Signed)
Left detailed message for Pt.  Advised per Dr. Corliss Parish to stop carvedilol and switch back to prior dose of metoprolol succinate 100 mg PO BID.  New prescription sent to pharmacy advising of change.  Requested call back if Pt had any questions.

## 2021-02-08 NOTE — Telephone Encounter (Signed)
Pt monitor is connected and working properly.

## 2021-02-10 ENCOUNTER — Encounter (HOSPITAL_COMMUNITY): Payer: Self-pay

## 2021-02-10 ENCOUNTER — Emergency Department (HOSPITAL_COMMUNITY): Payer: HMO

## 2021-02-10 ENCOUNTER — Inpatient Hospital Stay (HOSPITAL_COMMUNITY)
Admission: EM | Admit: 2021-02-10 | Discharge: 2021-02-12 | DRG: 291 | Disposition: A | Discharge status: Home / Self Care | Payer: HMO | Attending: Internal Medicine | Admitting: Internal Medicine

## 2021-02-10 DIAGNOSIS — J449 Chronic obstructive pulmonary disease, unspecified: Secondary | ICD-10-CM | POA: Diagnosis not present

## 2021-02-10 DIAGNOSIS — I130 Hypertensive heart and chronic kidney disease with heart failure and stage 1 through stage 4 chronic kidney disease, or unspecified chronic kidney disease: Principal | ICD-10-CM | POA: Diagnosis present

## 2021-02-10 DIAGNOSIS — Z7901 Long term (current) use of anticoagulants: Secondary | ICD-10-CM

## 2021-02-10 DIAGNOSIS — I4811 Longstanding persistent atrial fibrillation: Secondary | ICD-10-CM

## 2021-02-10 DIAGNOSIS — E1122 Type 2 diabetes mellitus with diabetic chronic kidney disease: Secondary | ICD-10-CM | POA: Diagnosis present

## 2021-02-10 DIAGNOSIS — Z951 Presence of aortocoronary bypass graft: Secondary | ICD-10-CM | POA: Diagnosis not present

## 2021-02-10 DIAGNOSIS — K219 Gastro-esophageal reflux disease without esophagitis: Secondary | ICD-10-CM | POA: Diagnosis not present

## 2021-02-10 DIAGNOSIS — I5023 Acute on chronic systolic (congestive) heart failure: Secondary | ICD-10-CM | POA: Diagnosis not present

## 2021-02-10 DIAGNOSIS — Z91030 Bee allergy status: Secondary | ICD-10-CM

## 2021-02-10 DIAGNOSIS — N1831 Chronic kidney disease, stage 3a: Secondary | ICD-10-CM | POA: Diagnosis present

## 2021-02-10 DIAGNOSIS — M1990 Unspecified osteoarthritis, unspecified site: Secondary | ICD-10-CM | POA: Diagnosis present

## 2021-02-10 DIAGNOSIS — E785 Hyperlipidemia, unspecified: Secondary | ICD-10-CM | POA: Diagnosis not present

## 2021-02-10 DIAGNOSIS — E669 Obesity, unspecified: Secondary | ICD-10-CM | POA: Diagnosis not present

## 2021-02-10 DIAGNOSIS — M109 Gout, unspecified: Secondary | ICD-10-CM | POA: Diagnosis not present

## 2021-02-10 DIAGNOSIS — Z823 Family history of stroke: Secondary | ICD-10-CM

## 2021-02-10 DIAGNOSIS — N183 Chronic kidney disease, stage 3 unspecified: Secondary | ICD-10-CM | POA: Diagnosis present

## 2021-02-10 DIAGNOSIS — I4819 Other persistent atrial fibrillation: Secondary | ICD-10-CM | POA: Diagnosis not present

## 2021-02-10 DIAGNOSIS — I2510 Atherosclerotic heart disease of native coronary artery without angina pectoris: Secondary | ICD-10-CM | POA: Diagnosis not present

## 2021-02-10 DIAGNOSIS — I5043 Acute on chronic combined systolic (congestive) and diastolic (congestive) heart failure: Secondary | ICD-10-CM | POA: Diagnosis not present

## 2021-02-10 DIAGNOSIS — Z20822 Contact with and (suspected) exposure to covid-19: Secondary | ICD-10-CM

## 2021-02-10 DIAGNOSIS — I517 Cardiomegaly: Secondary | ICD-10-CM | POA: Diagnosis not present

## 2021-02-10 DIAGNOSIS — Z6830 Body mass index (BMI) 30.0-30.9, adult: Secondary | ICD-10-CM | POA: Diagnosis not present

## 2021-02-10 DIAGNOSIS — J441 Chronic obstructive pulmonary disease with (acute) exacerbation: Secondary | ICD-10-CM | POA: Diagnosis not present

## 2021-02-10 DIAGNOSIS — Z7984 Long term (current) use of oral hypoglycemic drugs: Secondary | ICD-10-CM

## 2021-02-10 DIAGNOSIS — Z950 Presence of cardiac pacemaker: Secondary | ICD-10-CM

## 2021-02-10 DIAGNOSIS — I255 Ischemic cardiomyopathy: Secondary | ICD-10-CM | POA: Diagnosis present

## 2021-02-10 DIAGNOSIS — Z888 Allergy status to other drugs, medicaments and biological substances status: Secondary | ICD-10-CM | POA: Diagnosis not present

## 2021-02-10 DIAGNOSIS — J9601 Acute respiratory failure with hypoxia: Secondary | ICD-10-CM | POA: Diagnosis present

## 2021-02-10 DIAGNOSIS — I4821 Permanent atrial fibrillation: Secondary | ICD-10-CM | POA: Diagnosis not present

## 2021-02-10 DIAGNOSIS — D696 Thrombocytopenia, unspecified: Secondary | ICD-10-CM | POA: Diagnosis present

## 2021-02-10 DIAGNOSIS — Z833 Family history of diabetes mellitus: Secondary | ICD-10-CM

## 2021-02-10 DIAGNOSIS — E66811 Obesity, class 1: Secondary | ICD-10-CM | POA: Diagnosis present

## 2021-02-10 DIAGNOSIS — R7989 Other specified abnormal findings of blood chemistry: Secondary | ICD-10-CM | POA: Diagnosis present

## 2021-02-10 DIAGNOSIS — Z79899 Other long term (current) drug therapy: Secondary | ICD-10-CM

## 2021-02-10 DIAGNOSIS — I110 Hypertensive heart disease with heart failure: Secondary | ICD-10-CM | POA: Diagnosis not present

## 2021-02-10 DIAGNOSIS — I4891 Unspecified atrial fibrillation: Secondary | ICD-10-CM | POA: Diagnosis not present

## 2021-02-10 DIAGNOSIS — Z91041 Radiographic dye allergy status: Secondary | ICD-10-CM

## 2021-02-10 DIAGNOSIS — I10 Essential (primary) hypertension: Secondary | ICD-10-CM | POA: Diagnosis not present

## 2021-02-10 DIAGNOSIS — F17210 Nicotine dependence, cigarettes, uncomplicated: Secondary | ICD-10-CM | POA: Diagnosis not present

## 2021-02-10 DIAGNOSIS — Z8042 Family history of malignant neoplasm of prostate: Secondary | ICD-10-CM

## 2021-02-10 DIAGNOSIS — Z8674 Personal history of sudden cardiac arrest: Secondary | ICD-10-CM

## 2021-02-10 DIAGNOSIS — R778 Other specified abnormalities of plasma proteins: Secondary | ICD-10-CM | POA: Diagnosis present

## 2021-02-10 DIAGNOSIS — R0602 Shortness of breath: Secondary | ICD-10-CM | POA: Diagnosis not present

## 2021-02-10 DIAGNOSIS — Z9114 Patient's other noncompliance with medication regimen: Secondary | ICD-10-CM

## 2021-02-10 DIAGNOSIS — Z885 Allergy status to narcotic agent status: Secondary | ICD-10-CM

## 2021-02-10 DIAGNOSIS — Z91013 Allergy to seafood: Secondary | ICD-10-CM

## 2021-02-10 DIAGNOSIS — N179 Acute kidney failure, unspecified: Secondary | ICD-10-CM | POA: Diagnosis present

## 2021-02-10 DIAGNOSIS — Z807 Family history of other malignant neoplasms of lymphoid, hematopoietic and related tissues: Secondary | ICD-10-CM

## 2021-02-10 DIAGNOSIS — Z8249 Family history of ischemic heart disease and other diseases of the circulatory system: Secondary | ICD-10-CM

## 2021-02-10 LAB — BASIC METABOLIC PANEL
Anion gap: 10 (ref 5–15)
BUN: 30 mg/dL — ABNORMAL HIGH (ref 8–23)
CO2: 18 mmol/L — ABNORMAL LOW (ref 22–32)
Calcium: 9.6 mg/dL (ref 8.9–10.3)
Chloride: 113 mmol/L — ABNORMAL HIGH (ref 98–111)
Creatinine, Ser: 1.3 mg/dL — ABNORMAL HIGH (ref 0.44–1.00)
GFR, Estimated: 43 mL/min — ABNORMAL LOW (ref >60)
Glucose, Bld: 129 mg/dL — ABNORMAL HIGH (ref 70–99)
Potassium: 4.1 mmol/L (ref 3.5–5.1)
Sodium: 141 mmol/L (ref 135–145)

## 2021-02-10 LAB — CBC
HCT: 45 % (ref 36.0–46.0)
Hemoglobin: 14.1 g/dL (ref 12.0–15.0)
MCH: 26.9 pg (ref 26.0–34.0)
MCHC: 31.3 g/dL (ref 30.0–36.0)
MCV: 85.9 fL (ref 80.0–100.0)
Platelets: 147 10*3/uL — ABNORMAL LOW (ref 150–400)
RBC: 5.24 MIL/uL — ABNORMAL HIGH (ref 3.87–5.11)
RDW: 21.2 % — ABNORMAL HIGH (ref 11.5–15.5)
WBC: 7.8 10*3/uL (ref 4.0–10.5)
nRBC: 0 % (ref 0.0–0.2)

## 2021-02-10 LAB — GLUCOSE, CAPILLARY: Glucose-Capillary: 132 mg/dL — ABNORMAL HIGH (ref 70–99)

## 2021-02-10 LAB — RESP PANEL BY RT-PCR (FLU A&B, COVID) ARPGX2
Influenza A by PCR: NEGATIVE
Influenza B by PCR: NEGATIVE
SARS Coronavirus 2 by RT PCR: NEGATIVE

## 2021-02-10 LAB — BRAIN NATRIURETIC PEPTIDE: B Natriuretic Peptide: 1207.4 pg/mL — ABNORMAL HIGH (ref 0.0–100.0)

## 2021-02-10 LAB — TROPONIN I (HIGH SENSITIVITY)
Troponin I (High Sensitivity): 29 ng/L — ABNORMAL HIGH (ref <18)
Troponin I (High Sensitivity): 30 ng/L — ABNORMAL HIGH (ref <18)

## 2021-02-10 MED ORDER — GUAIFENESIN ER 600 MG PO TB12
600.0000 mg | ORAL_TABLET | Freq: Two times a day (BID) | ORAL | Status: DC
  Administered 2021-02-10 – 2021-02-12 (×4): 600 mg via ORAL
  Filled 2021-02-10 (×4): qty 1

## 2021-02-10 MED ORDER — SODIUM CHLORIDE 0.9 % IV SOLN: 250.0000 mL | INTRAVENOUS | Status: DC | PRN

## 2021-02-10 MED ORDER — ATORVASTATIN CALCIUM 10 MG PO TABS
10.0000 mg | ORAL_TABLET | Freq: Every day | ORAL | Status: DC
  Administered 2021-02-10 – 2021-02-11 (×2): 10 mg via ORAL
  Filled 2021-02-10 (×2): qty 1

## 2021-02-10 MED ORDER — ONDANSETRON HCL 4 MG/2ML IJ SOLN: 4.0000 mg | Freq: Four times a day (QID) | INTRAMUSCULAR | Status: DC | PRN

## 2021-02-10 MED ORDER — METHYLPREDNISOLONE SODIUM SUCC 125 MG IJ SOLR
125.0000 mg | Freq: Once | INTRAMUSCULAR | Status: AC
  Administered 2021-02-10: 125 mg via INTRAVENOUS
  Filled 2021-02-10: qty 2

## 2021-02-10 MED ORDER — SODIUM CHLORIDE 0.9% FLUSH
3.0000 mL | INTRAVENOUS | Status: DC | PRN
Start: 2021-02-10 — End: 2021-02-12

## 2021-02-10 MED ORDER — FUROSEMIDE 10 MG/ML IJ SOLN
40.0000 mg | Freq: Two times a day (BID) | INTRAMUSCULAR | Status: DC
  Administered 2021-02-10 – 2021-02-12 (×5): 40 mg via INTRAVENOUS
  Filled 2021-02-10 (×4): qty 4

## 2021-02-10 MED ORDER — SODIUM CHLORIDE 0.9% FLUSH
3.0000 mL | Freq: Two times a day (BID) | INTRAVENOUS | Status: DC
  Administered 2021-02-10 – 2021-02-12 (×5): 3 mL via INTRAVENOUS

## 2021-02-10 MED ORDER — PANTOPRAZOLE SODIUM 40 MG PO TBEC
40.0000 mg | DELAYED_RELEASE_TABLET | Freq: Every morning | ORAL | Status: DC
  Administered 2021-02-10 – 2021-02-12 (×3): 40 mg via ORAL

## 2021-02-10 MED ORDER — IPRATROPIUM-ALBUTEROL 0.5-2.5 (3) MG/3ML IN SOLN
3.0000 mL | Freq: Four times a day (QID) | RESPIRATORY_TRACT | Status: DC
  Administered 2021-02-10 – 2021-02-11 (×2): 3 mL via RESPIRATORY_TRACT
  Filled 2021-02-10 (×2): qty 3

## 2021-02-10 MED ORDER — INSULIN ASPART 100 UNIT/ML IJ SOLN: 0.0000 [IU] | Freq: Every day | INTRAMUSCULAR | Status: DC

## 2021-02-10 MED ORDER — ALLOPURINOL 300 MG PO TABS
300.0000 mg | ORAL_TABLET | Freq: Every day | ORAL | Status: DC
  Administered 2021-02-10 – 2021-02-12 (×3): 300 mg via ORAL
  Filled 2021-02-10 (×3): qty 1

## 2021-02-10 MED ORDER — FUROSEMIDE 10 MG/ML IJ SOLN
40.0000 mg | Freq: Once | INTRAMUSCULAR | Status: AC
  Administered 2021-02-10: 40 mg via INTRAVENOUS
  Filled 2021-02-10: qty 4

## 2021-02-10 MED ORDER — INSULIN ASPART 100 UNIT/ML IJ SOLN
0.0000 [IU] | Freq: Three times a day (TID) | INTRAMUSCULAR | Status: DC
  Administered 2021-02-11: 2 [IU] via SUBCUTANEOUS
  Administered 2021-02-11: 5 [IU] via SUBCUTANEOUS
  Administered 2021-02-11: 7 [IU] via SUBCUTANEOUS
  Administered 2021-02-12: 1 [IU] via SUBCUTANEOUS

## 2021-02-10 MED ORDER — METOPROLOL SUCCINATE ER 100 MG PO TB24
100.0000 mg | ORAL_TABLET | Freq: Two times a day (BID) | ORAL | Status: DC
  Administered 2021-02-10 – 2021-02-12 (×5): 100 mg via ORAL
  Filled 2021-02-10 (×5): qty 1

## 2021-02-10 MED ORDER — RIVAROXABAN 20 MG PO TABS
20.0000 mg | ORAL_TABLET | Freq: Every day | ORAL | Status: DC
  Administered 2021-02-10 – 2021-02-11 (×2): 20 mg via ORAL
  Filled 2021-02-10 (×2): qty 1

## 2021-02-10 MED ORDER — ALBUTEROL SULFATE (2.5 MG/3ML) 0.083% IN NEBU
2.5000 mg | INHALATION_SOLUTION | Freq: Four times a day (QID) | RESPIRATORY_TRACT | Status: DC | PRN
  Administered 2021-02-10 (×2): 2.5 mg via RESPIRATORY_TRACT
  Filled 2021-02-10 (×2): qty 3

## 2021-02-10 MED ORDER — LOSARTAN POTASSIUM 25 MG PO TABS
25.0000 mg | ORAL_TABLET | Freq: Every day | ORAL | Status: DC
  Administered 2021-02-10 – 2021-02-12 (×3): 25 mg via ORAL
  Filled 2021-02-10 (×3): qty 1

## 2021-02-10 MED ORDER — PREDNISONE 20 MG PO TABS
40.0000 mg | ORAL_TABLET | Freq: Every day | ORAL | Status: DC
  Administered 2021-02-11 – 2021-02-12 (×2): 40 mg via ORAL
  Filled 2021-02-10 (×2): qty 2

## 2021-02-10 MED ORDER — ACETAMINOPHEN 325 MG PO TABS: 650.0000 mg | ORAL_TABLET | ORAL | Status: DC | PRN

## 2021-02-10 NOTE — ED Triage Notes (Signed)
Pt arrived via POV; pt states yesterday she could not breath and came in due to A-fib with some nausa; pt states she had pace maker placed 3 weeks ago; Pt c/o right chest pain on yesterday but denies pain today. Pt A&ox 4 on arrival; Pt from home with family who states with her overnight.- Monique,RN

## 2021-02-10 NOTE — H&P (Signed)
History and Physical    [name] [name] VWU:[id number] DOB: January 29, 1946 DOA: 02/10/2021  Referring MD/NP/PA: Beckie Salts, MD PCP: [name], MD  Patient coming from: home   Chief Complaint: Shortness of breath  I have personally briefly reviewed patient's old medical records in [place]   HPI: [name] [name] is a 75 y.o. female with medical history significant of for permanent A. Fib s/p PPM and AV node ablation, HTN, HLD, CHF with EF 25-30%, CAD s/p CABG in 2016, cardiac arrest, diabetes mellitus type 2, CKD stage III, COPD, GERD, and gout presents with complaints of shortness of breath over the last 2 days. Patient just recently had undergone AV ablation with pacemaker placed 3 weeks ago by Dr. Lovena Le, and was mild hospitalized 2 months ago for acute respiratory failure with hypoxia secondary to acute on chronic systolic congestive heart failure exacerbation.  Over the last 1-2 weeks she reports that she been having difficulty with shortness of breath whenever trying to lay down at night. Reports having a productive cough with clear sputum production.  Walking from her car to her back porch causes her to be out of breath for which she has to stop and rest.  Noted associated symptoms of some right-sided chest pain as well.  Patient reports that she has been taking all medications as prescribed.  She reports that she has been lonely and that all of her close family members and neighbors have passed away.  She admits to smoking 2 to 3 cigarettes/day on average to use the bathroom.  ED Course: Upon admission into the emergency department patient was seen to be afebrile, respirations 17-34, blood pressures 108/48-145/100, and all other vital signs maintained.  Labs significant for platelets 147, CO2 18, BUN 30, creatinine 1.3, BNP 1207.4, high-sensitivity troponin 29.  Chest x-ray significant for acute on chronic interstitial markings concerning for pulmonary edema.  Patient was given 40 mg  of Lasix IV.  Review of Systems  Constitutional:  Positive for malaise/fatigue. Negative for fever.  HENT:  Positive for congestion.   Eyes:  Negative for photophobia and pain.  Respiratory:  Positive for cough, sputum production and shortness of breath.   Cardiovascular:  Positive for chest pain and palpitations. Negative for leg swelling.  Gastrointestinal:  Negative for abdominal pain and vomiting.  Genitourinary:  Negative for dysuria and hematuria.  Musculoskeletal:  Negative for falls.  Skin:  Negative for rash.  Neurological:  Negative for focal weakness and loss of consciousness.  Psychiatric/Behavioral:  Positive for depression. The patient has insomnia.    Past Medical History:  Diagnosis Date   ([place]) heart failure with improved ejection fraction ([place])    a. 07/2018 Echo: EF 30-35%; b. 11/2019 Echo: EF 55-60%, no rwma, Gr2 DD, Nl RV size/fxn. Mild BAE. Mild MR/AI.   Arthritis    Asthma    Atopic dermatitis    CAD (coronary artery disease)    a. 2016 s/p CABG x 2 (LIMA->LAD, VG->OM); b. 08/2018 MV: EF 44%, no ischemia/infact.   Cardiac arrest ([place]) 10/2014   Cardiomyopathy, ischemic    a. 07/2018 Echo: EF 30-35%; 11/2019 Echo: EF 55-60%.   Carotid arterial disease ([place])    a. 11/2019 Carotid U/S   CKD (chronic kidney disease), stage III (HCC)    COPD (chronic obstructive pulmonary disease) ([place])    Esophageal dilatation 2013   GERD (gastroesophageal reflux disease)    Gout    Headache    Hypertension    Hypokalemia  Idiopathic angioedema    LGI bleed 08/06/2017   a. felt to be hemorrhoidal during that admission (no drop in Hgb).   Lower back pain    Paroxysmal atrial fibrillation (HCC)    a. Dx 2016-->h/o difficult to control rates (complicated by noncompliance), not felt to be a candidate for ablation or antiarrhythmic due to noncompliance; b. Recurrent AF 2021 - converted w/ IV dilt; c. CHA2DS2VASc = 7-->Xarelto.   Personal history of noncompliance with medical  treatment, presenting hazards to health    Prediabetes    S/P CABG x 2 with clipping of LA appendage 11/14/2014   LIMA to LAD, SVG to OM, EVH via right thigh   Urine incontinence    Uterine fibroid     Past Surgical History:  Procedure Laterality Date   AV NODE ABLATION N/A 01/18/2021   Procedure: AV NODE ABLATION;  Surgeon: Evans Lance, MD;  Location: [facility];  Service: Cardiovascular;  Laterality: N/A;   BALLOON DILATION N/A 10/10/2019   Procedure: BALLOON DILATION;  Surgeon: Otis Brace, MD;  Location: [facility];  Service: Gastroenterology;  Laterality: N/A;   BIOPSY  10/10/2019   Procedure: BIOPSY;  Surgeon: Otis Brace, MD;  Location: [place];  Service: Gastroenterology;;   BIOPSY  10/11/2019   Procedure: BIOPSY;  Surgeon: Otis Brace, MD;  Location: [place];  Service: Gastroenterology;;   CARDIOVERSION N/A 11/18/2014   Procedure: CARDIOVERSION;  Surgeon: [name], MD;  Location: [place];  Service: Cardiovascular;  Laterality: N/A;   CARDIOVERSION N/A 12/25/2017   Procedure: CARDIOVERSION;  Surgeon: [name], MD;  Location: [place];  Service: Cardiovascular;  Laterality: N/A;   CLIPPING OF ATRIAL APPENDAGE N/A 11/14/2014   Procedure: CLIPPING OF ATRIAL APPENDAGE;  Surgeon: [name], MD;  Location: [place];  Service: Open Heart Surgery;  Laterality: N/A;   COLONOSCOPY  2013   COLONOSCOPY WITH PROPOFOL N/A 10/11/2019   Procedure: COLONOSCOPY WITH PROPOFOL;  Surgeon: Otis Brace, MD;  Location: [place];  Service: Gastroenterology;  Laterality: N/A;   CORONARY ARTERY BYPASS GRAFT N/A 11/14/2014   Procedure: CORONARY ARTERY BYPASS GRAFTING (CABG)TIMES 2 USING LEFT INTERNAL MAMMARY ARTERY AND RIGHT SAPHENOUS VEIN HARVESTED ENDOSCOPICALLY;  Surgeon: [name], MD;  Location: [place];  Service: Open Heart Surgery;  Laterality: N/A;   ESOPHAGOGASTRODUODENOSCOPY (EGD) WITH PROPOFOL N/A 10/10/2019    Procedure: ESOPHAGOGASTRODUODENOSCOPY (EGD) WITH PROPOFOL;  Surgeon: Otis Brace, MD;  Location: [place];  Service: Gastroenterology;  Laterality: N/A;   ESOPHAGOGASTRODUODENOSCOPY (EGD) WITH PROPOFOL N/A 10/27/2019   Procedure: ESOPHAGOGASTRODUODENOSCOPY (EGD) WITH PROPOFOL;  Surgeon: Ronnette Juniper, MD;  Location: [place];  Service: Gastroenterology;  Laterality: N/A;   GIVENS CAPSULE STUDY N/A 10/27/2019   Procedure: GIVENS CAPSULE STUDY;  Surgeon: Ronnette Juniper, MD;  Location: [place];  Service: Gastroenterology;  Laterality: N/A;   LEFT HEART CATHETERIZATION WITH CORONARY ANGIOGRAM N/A 11/04/2014   Procedure: LEFT HEART CATHETERIZATION WITH CORONARY ANGIOGRAM;  Surgeon: Troy Sine, MD;  Location[facility] CATH LAB;  Service: Cardiovascular;  Laterality: N/A;   PACEMAKER IMPLANT N/A 01/18/2021   Procedure: PACEMAKER IMPLANT;  Surgeon: Evans Lance, MD;  Location: [place] CV LAB;  Service: Cardiovascular;  Laterality: N/A;   PACEMAKER IMPLANT Left    POLYPECTOMY  10/11/2019   Procedure: POLYPECTOMY;  Surgeon: Otis Brace, MD;  Location: [facility] ENDOSCOPY;  Service: Gastroenterology;;   TEE WITHOUT CARDIOVERSION N/A 11/14/2014   Procedure: TRANSESOPHAGEAL ECHOCARDIOGRAM (TEE);  Surgeon: [name], MD;  Location[facility]  OR;  Service: Open Heart Surgery;  Laterality: N/A;   TEE WITHOUT CARDIOVERSION N/A 12/25/2017   Procedure: TRANSESOPHAGEAL ECHOCARDIOGRAM (TEE);  Surgeon: [name], MD;  [facility] ENDOSCOPY;  Service: Cardiovascular;  Laterality: N/A;   TEMPORARY PACEMAKER INSERTION  11/04/2014   Procedure: TEMPORARY PACEMAKER INSERTION;  Surgeon: Troy Sine, MD;  Location: [facility] C[facility] LAB;  Service: Cardiovascular;;   TOOTH EXTRACTION       reports that she has been smoking cigarettes. She has a 28.00 pack-year smoking history. She has never used smokeless tobacco. She reports that she does not drink alcohol and does not use drugs.  Allergies  Allergen  Reactions   Bee Venom Anaphylaxis   Ivp Dye [Iodinated Diagnostic Agents] Anaphylaxis   Ace Inhibitors     Other reaction(s): angioedema   Atenolol     Other reaction(s): severe headaches   Codeine Nausea And Vomiting   Entresto [Sacubitril-Valsartan] Other (See Comments)    Chest pain    Penicillin G     Other reaction(s): rash   Shrimp [Shellfish Allergy] Swelling   Simvastatin     Other reaction(s): not sure   Jardiance [Empagliflozin] Other (See Comments)    Caused Boils     Family History  Problem Relation Age of Onset   Cancer Mother        LYMPHOMA   Heart disease Father    Other Father        TB   CVA Sister    Prostate cancer Brother 17   Diabetes Brother     Prior to Admission medications   Medication Sig Start Date End Date Taking? Authorizing Provider  acetaminophen (TYLENOL) 325 MG tablet Take 2 tablets (650 mg total) by mouth every 4 (four) hours as needed for headache or mild pain. 12/09/20  Yes Mikhail, Velta Addison, DO  allopurinol (ZYLOPRIM) 300 MG tablet Take 1 tablet (300 mg total) by mouth daily. 01/04/18  Yes Georgiana Shore, NP  atorvastatin (LIPITOR) 10 MG tablet Take 10 mg by mouth at bedtime. 01/22/21  Yes [provider]  losartan (COZAAR) 25 MG tablet Take 1 tablet (25 mg total) by mouth daily. 10/19/20 02/10/21 Yes Baldwin Jamaica, PA-C  metFORMIN (GLUCOPHAGE) 500 MG tablet Take 500 mg by mouth daily with breakfast. Daily with a meal   Yes [provider]  metoprolol succinate (TOPROL-XL) 100 MG 24 hr tablet Take 1 tablet (100 mg total) by mouth 2 (two) times daily. Take with or immediately following a meal. 02/04/21 05/05/21 Yes Evans Lance, MD  pantoprazole (PROTONIX) 40 MG tablet Take 40 mg by mouth in the morning.   Yes [provider]  rivaroxaban (XARELTO) 20 MG TABS tablet Take 1 tablet (20 mg total) by mouth daily with supper. Resume from 11/01/2019 11/01/19  Yes Aline August, MD  Tiotropium Bromide Monohydrate  (SPIRIVA RESPIMAT) 2.5 MCG/ACT AERS Inhale 2 puffs into the lungs daily. Patient taking differently: Inhale 2 puffs into the lungs daily as needed (asthma). 10/23/20  Yes Parrett, Tammy S, NP  torsemide (DEMADEX) 20 MG tablet TAKE TWO TABLETS BY MOUTH ONCE DAILY Patient taking differently: Take 40 mg by mouth daily. 12/22/20  Yes Evans Lance, MD    Physical Exam:  Constitutional: Elderly female who appears to be in some respiratory distress Vitals:   02/10/21 0530 02/10/21 0600 02/10/21 0645 02/10/21 0654  BP: (!) 108/48 129/82  (!) 145/99  Pulse: 90 98  91  Resp: (!) 34 (!) 30  19  Temp:   97.9 F (36.6 C) 98.1 F (36.7 C)  TempSrc:   Oral Oral  SpO2: 98% 97%  99%   Eyes: PERRL, lids and conjunctivae normal ENMT: Mucous membranes are moist. Posterior pharynx clear of any exudate or lesions  Neck: normal, supple, no masses, no thyromegaly Respiratory: Tachypneic with decreased overall aeration in small expiratory wheezes appreciated in both lung fields.  Patient on room air but only able to talk in shortened sentences. Cardiovascular: Irregular rhythm and ventricular paced, no murmurs / rubs / gallops.  Trace bilateral lower extremity edema. 2+ pedal pulses. No carotid bruits.  Abdomen: no tenderness, no masses palpated. No hepatosplenomegaly. Bowel sounds positive.  Musculoskeletal: no clubbing / cyanosis. No joint deformity upper and lower extremities. Good ROM, no contractures. Normal muscle tone.  Skin: no rashes, lesions, ulcers. No induration Neurologic: CN 2-12 grossly intact.  Patient able to move all 4 extremities Psychiatric: Patient seemed as depressed mood.  Alert and oriented x3.    Labs on Admission: I have personally reviewed following labs and imaging studies  CBC: Recent Labs  Lab 02/10/21 0417  WBC 7.8  HGB 14.1  HCT 45.0  MCV 85.9  PLT 093*   Basic Metabolic Panel: Recent Labs  Lab 02/10/21 0417  NA 141  K 4.1  CL 113*  CO2 18*  GLUCOSE 129*   BUN 30*  CREATININE 1.30*  CALCIUM 9.6   GFR: CrCl cannot be calculated (Unknown ideal weight.). Liver Function Tests: No results for input(s): AST, ALT, ALKPHOS, BILITOT, PROT, ALBUMIN in the last 168 hours. No results for input(s): LIPASE, AMYLASE in the last 168 hours. No results for input(s): AMMONIA in the last 168 hours. Coagulation Profile: No results for input(s): INR, PROTIME in the last 168 hours. Cardiac Enzymes: No results for input(s): CKTOTAL, CKMB, CKMBINDEX, TROPONINI in the last 168 hours. BNP (last 3 results) No results for input(s): PROBNP in the last 8760 hours. HbA1C: No results for input(s): HGBA1C in the last 72 hours. CBG: No results for input(s): GLUCAP in the last 168 hours. Lipid Profile: No results for input(s): CHOL, HDL, LDLCALC, TRIG, CHOLHDL, LDLDIRECT in the last 72 hours. Thyroid Function Tests: No results for input(s): TSH, T4TOTAL, FREET4, T3FREE, THYROIDAB in the last 72 hours. Anemia Panel: No results for input(s): VITAMINB12, FOLATE, FERRITIN, TIBC, IRON, RETICCTPCT in the last 72 hours. Urine analysis:    Component Value Date/Time   COLORURINE YELLOW 12/08/2020 0938   APPEARANCEUR CLEAR 12/08/2020 0938   LABSPEC 1.017 12/08/2020 0938   PHURINE 5.0 12/08/2020 0938   GLUCOSEU NEGATIVE 12/08/2020 0938   HGBUR NEGATIVE 12/08/2020 0938   BILIRUBINUR NEGATIVE 12/08/2020 0938   BILIRUBINUR ng 12/25/2013 1342   KETONESUR NEGATIVE 12/08/2020 0938   PROTEINUR 30 (A) 12/08/2020 0938   UROBILINOGEN 0.2 04/11/2017 1402   NITRITE NEGATIVE 12/08/2020 0938   LEUKOCYTESUR NEGATIVE 12/08/2020 0938   Sepsis Labs: No results found for this or any previous visit (from the past 240 hour(s)).   Radiological Exams on Admission: DG Chest 2 View  Result Date: 02/10/2021 CLINICAL DATA:  75 year old female with shortness of breath. Atrial fibrillation. Pacemaker placed 3 weeks ago. EXAM: CHEST - 2 VIEW COMPARISON:  Portable chest 01/18/2021 and earlier.  FINDINGS: PA and lateral views today. Stable cardiomegaly and mediastinal contours. Stable sequelae of CABG. Stable left chest single lead transvenous pacemaker. Visualized tracheal air column is within normal limits. No pneumothorax or pleural effusion. Chronic but increased interstitial markings throughout both lungs. Chronic linear atelectasis  or scarring at the lingula is stable since last year. Exaggerated thoracic kyphosis. No acute osseous abnormality identified. Negative visible bowel gas pattern. IMPRESSION: 1. Acute on chronic interstitial markings in both lungs suspicious for interstitial edema in the setting of chronic cardiomegaly, atrial fibrillation. 2. Otherwise stable chest. Electronically Signed   By: Genevie Ann M.D.   On: 02/10/2021 04:47    EKG: Independently reviewed.  Ventricularly paced rhythm with premature supraventricular complexes at 94 bpm.  Assessment/Plan Acute on chronic systolic and congestive heart failure: Patient presents with complaints of shortness of breath.  Labs significant for BNP 1207.4.  Chest x-ray concerning for acute on chronic interstitial markings concerning for interstitial edema.  Last echocardiogram from 11/2020 revealed EF of 25-30% with global hypokinesis and moderately elevated pulmonary artery pressure. -Admit to a cardiac telemetry bed -Continuous pulse oximetry with nasal cannula oxygen maintain O2 saturation greater than 92% -Strict I&Os and daily weight -Lasix 40 mg IV twice daily -Continue to monitor and adjust diuretics as needed -Continue losartan and beta-blocker as tolerated -Appreciate cardiology consultative services we will follow-up for any further recommendation  Elevated troponin CAD status post CABG in 2016: Acute on chronic.  On admission high-sensitivity troponin 29->30, but otherwise noted to be flat.  Patient had a low risk Myoview in 08/2018.  Suspect secondary to demand in setting of acute respiratory failure -Continue  beta-blocker and statin  Persistent atrial fibrillation on chronic anticoagulation s/p PPM: Patient just underwent AV ablation with permanent pacemaker placement on 01/18/2021 with Dr. Lovena Le.  CHA2DS2-VASc score =6. -Continue Xarelto and Toprol  Suspected COPD exacerbation: Acute on chronic.  Patient noted to have mild expiratory wheeze noted on physical exam with decreased overall aeration.  Patient was noted to have improvement in aeration with breathing treatment earlier. -DuoNebs 4 times daily -Solu-Medrol 125 mg IV x1 dose and then start 40 mg of prednisone daily  Diabetes mellitus type 2: Last hemoglobin A1c 7 on 12/08/2020.  Home medications include [place] 500 mg daily with breakfast. -Hypoglycemic protocol -Held metformin -CBGs before every meal and at bedtime with sensitive SSI -Adjust insulin as needed while on steroids  Essential hypertension: Blood pressures currently elevated up to 145/100.  Home blood pressure medications include metoprolol and -Continue home blood pressure regimen as tolerated  Chronic kidney disease stage IIIb: Creatinine 1.3 which appears near patient's baseline. -Continue to monitor kidney function with diuresis  Hyperlipidemia -Continue atorvastatin  Thrombocytopenia: Chronic.  No reports of bleeding. -Recheck CBC tomorrow morning  GERD -Continue Protonix  Depression: Patient reports passing of multiple family friends and seems to be depressed.  She has a sister that lives in [place], but no one close.  Denies any thoughts of suicide. -Chaplain consult  Tobacco use: Patient reports only smoking to 3 cigarettes/day on average currently. -Counseled on the need of cessation of tobacco use.  Obesity: BMI 30.43 kg/m  DVT prophylaxis: Xarelto Code Status: Full Family Communication: Patient reports no one Disposition Plan: Home Consults called: Cardiology Admission status: Inpatient, require more than 2 midnight stay  Norval Morton  MD [facility]   If 7PM-7AM, please contact night-coverage   02/10/2021, 6:59 AM

## 2021-02-10 NOTE — Consult Note (Addendum)
Cardiology Consultation:   Patient ID: [name] [name]; [id number]; Feb 16, 1946   Admit date: 02/10/2021 Date of Consult: 02/10/2021  Primary Care Provider: [name], MD Primary Cardiologist: [name], MD  Patient Profile:   [name] [name] is a 75 y.o. female with a hx of CAD s/p CABG x2 in 2016, chronic combined CHF/ischemic cardiomyopathy with EF of 30-35% on Echo in 2019 with normalization per last echocardiogram, persistent atrial fibrillation on Xarelto s/p recent AV nodal ablation with PPM placement, COPD, hypertension, hyperlipidemia, pre-diabetes, CKD stage III, GERD, medication non-compliance, and prior tobacco use (quit in 2016) who is being seen today for the evaluation of CHF at the request of Dr. Wyvonnia Dusky.   History of Present Illness:   [name] [name] is a 75yo F with a hx as stated above who presented to [facility] with complaints of SOB for two days prior to presentation. HPI somewhat difficult to obtain due to somnolence on exam. She is having difficulty recalling her symptoms which brought her to the hospital this admission. She states that she was SOB however cannot recall clear details when asked. She reports medication compliance and shows me her medication "bag" which does not have her home torsemide or Xarelto in it. When questioned, she states that her other medications are in her home drawer. She denies chest pain, palpitations, dizziness, or syncope. She has had orthopnea symptoms. Given this, she presented for further evaluation.   In the ED, EKG showed V pacing with underlying atrial fibrillation. CXR showed markings suggestive of edema. BNP elevated at 1207. She has mild LE edema on exam. She was started on IV Lasix 40mg  BID. She was continued on home medications including Toprol XL 100, losartan 25, and Xarelto 20mg . Creatinine stable at 1.3 which appears to be at her baseline. Cardiology has been asked to follow for CHF.   She is typically followed by [name] and Dr.  Haroldine Laws for her cardiology care. She was initially seen in 11/2014 for atrial fibrillation and found to have a reduced LVEF at that time leading to an ischemic work-up which showed severe multivessel CAD with left main involvement with subsequent CABG x2 with LIMA to LAD and SVG to OM at that time. Echo from 07/2018 showed LVEF of 30-35% with normal wall motion, grade 1 diastolic dysfunction, and mild MR. RV was also noted to be mildly dilated with moderately to severely reduced systolic function. Most recent ischemic work-up was a Lexiscan Myoview in 08/2018 which was low risk and showed no reversible ischemia. In regards to her AF, she has been noted to be difficult to control. She has undergone a cardioversion in the past with rapid conversion back to AF. Also with a history of medication non-compliance and has significant memory issues, not previously felt to be a good ablation or anti-arrhythmic candidate. She has a prior hx of GI bleed at which time she was followed by GI and ultimately cleared to resume anticoagulation. She has more recently been followed by the AF clinic and Dr. Lovena Le. She has been treated with digoxin with consideration of AV node ablation and PPM placement due to symptoms of fatigue and CHF felt to be due to symptomatic AF. She wore a monitor 10/19/20 which showed Afib with both slow-normal and RVR, NSVT, and frequent PVCs and 100% AF burden. She saw pulmonary due to persistent SOB 10/23/20 and was started on stiolto, however she did not tolerate this therefore it was stopped. Pulmonology prefers stable Afib RVR prior  to starting a new inhaler since many can cause tachycardia as a side effect.   Most recently she was seen in hospital consultation for CHF exacerbation. She was treated with diuretics. EP was consulted as well due to uncontrolled AF . Plan was for OP follow up and subsequent AV nodal ablation and PPM implantation. This was performed 04/17/4267 without complication.    Past  Medical History:  Diagnosis Date   ([place]) heart failure with improved ejection fraction ([place])    a. 07/2018 Echo: EF 30-35%; b. 11/2019 Echo: EF 55-60%, no rwma, Gr2 DD, Nl RV size/fxn. Mild BAE. Mild MR/AI.   Arthritis    Asthma    Atopic dermatitis    CAD (coronary artery disease)    a. 2016 s/p CABG x 2 (LIMA->LAD, VG->OM); b. 08/2018 MV: EF 44%, no ischemia/infact.   Cardiac arrest ([place]) 10/2014   Cardiomyopathy, ischemic    a. 07/2018 Echo: EF 30-35%; 11/2019 Echo: EF 55-60%.   Carotid arterial disease ([place])    a. 11/2019 Carotid U/S   CKD (chronic kidney disease), stage III (HCC)    COPD (chronic obstructive pulmonary disease) ([place])    Esophageal dilatation 2013   GERD (gastroesophageal reflux disease)    Gout    Headache    Hypertension    Hypokalemia    Idiopathic angioedema    LGI bleed 08/06/2017   a. felt to be hemorrhoidal during that admission (no drop in Hgb).   Lower back pain    Paroxysmal atrial fibrillation (HCC)    a. Dx 2016-->h/o difficult to control rates (complicated by noncompliance), not felt to be a candidate for ablation or antiarrhythmic due to noncompliance; b. Recurrent AF 2021 - converted w/ IV dilt; c. CHA2DS2VASc = 7-->Xarelto.   Personal history of noncompliance with medical treatment, presenting hazards to health    Prediabetes    S/P CABG x 2 with clipping of LA appendage 11/14/2014   LIMA to LAD, SVG to OM, EVH via right thigh   Urine incontinence    Uterine fibroid     Past Surgical History:  Procedure Laterality Date   AV NODE ABLATION N/A 01/18/2021   Procedure: AV NODE ABLATION;  Surgeon: Evans Lance, MD;  Location: [place] CV LAB;  Service: Cardiovascular;  Laterality: N/A;   BALLOON DILATION N/A 10/10/2019   Procedure: BALLOON DILATION;  Surgeon: Otis Brace, MD;  Location: [facility];  Service: Gastroenterology;  Laterality: N/A;   BIOPSY  10/10/2019   Procedure: BIOPSY;  Surgeon: Otis Brace, MD;  Location:  [place];  Service: Gastroenterology;;   BIOPSY  10/11/2019   Procedure: BIOPSY;  Surgeon: Otis Brace, MD;  Location: [place];  Service: Gastroenterology;;   CARDIOVERSION N/A 11/18/2014   Procedure: CARDIOVERSION;  Surgeon: [name], MD;  Location: [place];  Service: Cardiovascular;  Laterality: N/A;   CARDIOVERSION N/A 12/25/2017   Procedure: CARDIOVERSION;  Surgeon: [name], MD;  Location: [place];  Service: Cardiovascular;  Laterality: N/A;   CLIPPING OF ATRIAL APPENDAGE N/A 11/14/2014   Procedure: CLIPPING OF ATRIAL APPENDAGE;  Surgeon: [name], MD;  Location: [address];  Service: Open Heart Surgery;  Laterality: N/A;   COLONOSCOPY  2013   COLONOSCOPY WITH PROPOFOL N/A 10/11/2019   Procedure: COLONOSCOPY WITH PROPOFOL;  Surgeon: Otis Brace, MD;  Location: [place];  Service: Gastroenterology;  Laterality: N/A;   CORONARY ARTERY BYPASS GRAFT N/A 11/14/2014   Procedure: CORONARY ARTERY BYPASS GRAFTING (CABG)TIMES 2 USING LEFT INTERNAL MAMMARY ARTERY  AND RIGHT SAPHENOUS VEIN HARVESTED ENDOSCOPICALLY;  Surgeon: [name], MD;  Location: [place];  Service: Open Heart Surgery;  Laterality: N/A;   ESOPHAGOGASTRODUODENOSCOPY (EGD) WITH PROPOFOL N/A 10/10/2019   Procedure: ESOPHAGOGASTRODUODENOSCOPY (EGD) WITH PROPOFOL;  Surgeon: Otis Brace, MD;  Location: [place];  Service: Gastroenterology;  Laterality: N/A;   ESOPHAGOGASTRODUODENOSCOPY (EGD) WITH PROPOFOL N/A 10/27/2019   Procedure: ESOPHAGOGASTRODUODENOSCOPY (EGD) WITH PROPOFOL;  Surgeon: Ronnette Juniper, MD;  Location: [place];  Service: Gastroenterology;  Laterality: N/A;   GIVENS CAPSULE STUDY N/A 10/27/2019   Procedure: GIVENS CAPSULE STUDY;  Surgeon: Ronnette Juniper, MD;  Location: [place];  Service: Gastroenterology;  Laterality: N/A;   LEFT HEART CATHETERIZATION WITH CORONARY ANGIOGRAM N/A 11/04/2014   Procedure: LEFT HEART CATHETERIZATION WITH CORONARY ANGIOGRAM;   Surgeon: Troy Sine, MD;  Location: [facility] CATH LAB;  Service: Cardiovascular;  Laterality: N/A;   PACEMAKER IMPLANT N/A 01/18/2021   Procedure: PACEMAKER IMPLANT;  Surgeon: Evans Lance, MD;  Location: [place] CV LAB;  Service: Cardiovascular;  Laterality: N/A;   PACEMAKER IMPLANT Left    POLYPECTOMY  10/11/2019   Procedure: POLYPECTOMY;  Surgeon: Otis Brace, MD;  Location: [place] ENDOSCOPY;  Service: Gastroenterology;;   TEE WITHOUT CARDIOVERSION N/A 11/14/2014   Procedure: TRANSESOPHAGEAL ECHOCARDIOGRAM (TEE);  Surgeon: [name], MD;  Location: [place];  Service: Open Heart Surgery;  Laterality: N/A;   TEE WITHOUT CARDIOVERSION N/A 12/25/2017   Procedure: TRANSESOPHAGEAL ECHOCARDIOGRAM (TEE);  Surgeon: [name], MD;  Location: [facility] ENDOSCOPY;  Service: Cardiovascular;  Laterality: N/A;   TEMPORARY PACEMAKER INSERTION  11/04/2014   Procedure: TEMPORARY PACEMAKER INSERTION;  Surgeon: Troy Sine, MD;  Location: [facility] CATH LAB;  Service: Cardiovascular;;   TOOTH EXTRACTION       Prior to Admission medications   Medication Sig Start Date End Date Taking? Authorizing Provider  acetaminophen (TYLENOL) 325 MG tablet Take 2 tablets (650 mg total) by mouth every 4 (four) hours as needed for headache or mild pain. 12/09/20  Yes Mikhail, Velta Addison, DO  allopurinol (ZYLOPRIM) 300 MG tablet Take 1 tablet (300 mg total) by mouth daily. 01/04/18  Yes Georgiana Shore, NP  atorvastatin (LIPITOR) 10 MG tablet Take 10 mg by mouth at bedtime. 01/22/21  Yes [provider]  losartan (COZAAR) 25 MG tablet Take 1 tablet (25 mg total) by mouth daily. 10/19/20 02/10/21 Yes Baldwin Jamaica, PA-C  metFORMIN (GLUCOPHAGE) 500 MG tablet Take 500 mg by mouth daily with breakfast. Daily with a meal   Yes [provider]  metoprolol succinate (TOPROL-XL) 100 MG 24 hr tablet Take 1 tablet (100 mg total) by mouth 2 (two) times daily. Take with or immediately following a meal. 02/04/21  05/05/21 Yes Evans Lance, MD  pantoprazole (PROTONIX) 40 MG tablet Take 40 mg by mouth in the morning.   Yes [provider]  rivaroxaban (XARELTO) 20 MG TABS tablet Take 1 tablet (20 mg total) by mouth daily with supper. Resume from 11/01/2019 11/01/19  Yes Aline August, MD  Tiotropium Bromide Monohydrate (SPIRIVA RESPIMAT) 2.5 MCG/ACT AERS Inhale 2 puffs into the lungs daily. Patient taking differently: Inhale 2 puffs into the lungs daily as needed (asthma). 10/23/20  Yes Parrett, Tammy S, NP  torsemide (DEMADEX) 20 MG tablet TAKE TWO TABLETS BY MOUTH ONCE DAILY Patient taking differently: Take 40 mg by mouth daily. 12/22/20  Yes Evans Lance, MD    Inpatient Medications: Scheduled Meds:  allopurinol  300 mg Oral Daily   atorvastatin  10 mg Oral QHS   furosemide  40 mg Intravenous BID   losartan  25 mg Oral Daily   metoprolol succinate  100 mg Oral BID   pantoprazole  40 mg Oral q AM   rivaroxaban  20 mg Oral Q supper   sodium chloride flush  3 mL Intravenous Q12H   Continuous Infusions:  sodium chloride     PRN Meds: sodium chloride, acetaminophen, ondansetron (ZOFRAN) IV, sodium chloride flush  Allergies:    Allergies  Allergen Reactions   Bee Venom Anaphylaxis   Ivp Dye [Iodinated Diagnostic Agents] Anaphylaxis   Ace Inhibitors     Other reaction(s): angioedema   Atenolol     Other reaction(s): severe headaches   Codeine Nausea And Vomiting   Entresto [Sacubitril-Valsartan] Other (See Comments)    Chest pain    Penicillin G     Other reaction(s): rash   Shrimp [Shellfish Allergy] Swelling   Simvastatin     Other reaction(s): not sure   Jardiance [Empagliflozin] Other (See Comments)    Caused Boils     Social History:   Social History   Socioeconomic History   Marital status: Widowed    Spouse name: Not on file   Number of children: 1   Years of education: 58   Highest education level: Not on file  Occupational History   Occupation: RETIRED  [employer]  Tobacco Use   Smoking status: Every Day    Packs/day: 0.50    Years: 56.00    Pack years: 28.00    Types: Cigarettes   Smokeless tobacco: Never   Tobacco comments:    smoking 2 cigarettes/day as of 10/23/20  Vaping Use   Vaping Use: Never used  Substance and Sexual Activity   Alcohol use: No    Alcohol/week: 0.0 standard drinks   Drug use: No   Sexual activity: Not on file  Other Topics Concern   Not on file  Social History Narrative   Patient reports it being difficult to pay for everything due to outstanding medical bills from hospital stay last year but states she is able to keep up with basic expenses.  Patient does have some concerns with her house's condition due to a water leak- had her roof repaired last month but has some leaking in the front which has affected her porch.  Patient owns her own car and is able to drive herself but has some concerns about the reliability of her car- has gotten taxi to the clinic for appointment in the past when her car broke down.   Social Determinants of Health   Financial Resource Strain: Not on file  Food Insecurity: Not on file  Transportation Needs: Not on file  Physical Activity: Not on file  Stress: Not on file  Social Connections: Not on file  Intimate Partner Violence: Not on file    Family History:   Family History  Problem Relation Age of Onset   Cancer Mother        LYMPHOMA   Heart disease Father    Other Father        TB   CVA Sister    Prostate cancer Brother 15   Diabetes Brother    Family Status:  Family Status  Relation Name Status   Mother  Deceased   Father  Deceased   Sister  Alive   Brother  Alive   MGM  Deceased   MGF  Deceased   [place]  Deceased  PGF  Deceased   Brother  Alive   Sister  Alive    ROS:  Please see the history of present illness.  All other ROS reviewed and negative.     Physical Exam/Data:   Vitals:   02/10/21 0654 02/10/21 0700 02/10/21 0752 02/10/21  1124  BP: (!) 145/99  (!) 139/110 (!) 141/102  Pulse: 91  86 93  Resp: 19  20 20   Temp: 98.1 F (36.7 C)  98 F (36.7 C) 98.2 F (36.8 C)  TempSrc: Oral  Axillary Oral  SpO2: 99%  99% 98%  Weight:  89.4 kg    Height:  5' 7.5" (1.715 m)      Intake/Output Summary (Last 24 hours) at 02/10/2021 1305 Last data filed at 02/10/2021 1105 Gross per 24 hour  Intake 240 ml  Output 950 ml  Net -710 ml   Filed Weights   02/10/21 0700  Weight: 89.4 kg   Body mass index is 30.43 kg/m.   General: Well developed, well nourished, NAD Neck: Negative for carotid bruits. No JVD Lungs:Diminished in bilateral upper and lower lobes L>R. Breathing is unlabored. Cardiovascular: Irregularly irregular with V pacing. No murmurs, rubs, gallops, or LV heave appreciated. Abdomen: Soft, non-tender, non-distended. No obvious abdominal masses. Extremities: Mild BLE edema. No clubbing or cyanosis. DP/PT pulses 2+ bilaterally Neuro: Alert and oriented. No focal deficits. No facial asymmetry. MAE spontaneously. Psych: Responds to questions appropriately with normal affect.     EKG:  The EKG was personally reviewed and demonstrates:  02/10/21 Vpaced with underlying atrial fibrillation, LVH and no acute changes.  Telemetry:  Telemetry was personally reviewed and demonstrates:  V pacing with underlying AF, HR controlled in the 90's.   Relevant CV Studies:  Echocardiogram 10/10/2019: Impressions: 1. Left ventricular ejection fraction, by estimation, is 60 to 65%. The  left ventricle has normal function. The left ventricle has no regional  wall motion abnormalities. Left ventricular diastolic parameters are  indeterminate. Elevated left ventricular  end-diastolic pressure.   2. Right ventricular systolic function is normal. The right ventricular  size is normal. Tricuspid regurgitation signal is inadequate for assessing  PA pressure.   3. The mitral valve is normal in structure and function. No evidence of   mitral valve regurgitation. No evidence of mitral stenosis.   4. The aortic valve has an indeterminant number of cusps. Aortic valve  regurgitation is not visualized. No aortic stenosis is present.   5. The inferior vena cava is normal in size with greater than 50%  respiratory variability, suggesting right atrial pressure of 3 mmHg.     Echcocardiogram 08/03/2018: Study Conclusions: - Left ventricle: The cavity size was mildly dilated. Systolic    function was moderately to severely reduced. The estimated    ejection fraction was in the range of 30% to 35%. Wall motion was    normal; there were no regional wall motion abnormalities. Doppler    parameters are consistent with abnormal left ventricular    relaxation (grade 1 diastolic dysfunction).  - Aortic valve: Transvalvular velocity was within the normal range.    There was no stenosis. There was trivial regurgitation.  - Mitral valve: There was mild regurgitation.  - Left atrium: The atrium was severely dilated.  - Right ventricle: The cavity size was mildly dilated. Wall    thickness was normal. Systolic function was moderately to    severely reduced. RV systolic pressure (S, est): 28 mm Hg.  - Right atrium: The atrium was  moderately dilated.  - Inferior vena cava: The vessel was normal in size. The    respirophasic diameter changes were blunted (< 50%).  - Pericardium, extracardiac: There was no pericardial effusion. _______________   [facility] Adam Myoview 09/07/2018: Nuclear stress EF: 44%. There was no ST segment deviation noted during stress. The study is normal. This is a low risk study. The left ventricular ejection fraction is moderately decreased (30-44%).  Laboratory Data:  Chemistry Recent Labs  Lab 02/10/21 0417  NA 141  K 4.1  CL 113*  CO2 18*  GLUCOSE 129*  BUN 30*  CREATININE 1.30*  CALCIUM 9.6  GFRNONAA 43*  ANIONGAP 10    Total Protein  Date Value Ref Range Status  12/08/2020 5.5 (L) 6.5 - 8.1  g/dL Final   Albumin  Date Value Ref Range Status  12/08/2020 2.7 (L) 3.5 - 5.0 g/dL Final   AST  Date Value Ref Range Status  12/08/2020 25 15 - 41 U/L Final   ALT  Date Value Ref Range Status  12/08/2020 14 0 - 44 U/L Final   Alkaline Phosphatase  Date Value Ref Range Status  12/08/2020 120 38 - 126 U/L Final   Total Bilirubin  Date Value Ref Range Status  12/08/2020 2.0 (H) 0.3 - 1.2 mg/dL Final   Hematology Recent Labs  Lab 02/10/21 0417  WBC 7.8  RBC 5.24*  HGB 14.1  HCT 45.0  MCV 85.9  MCH 26.9  MCHC 31.3  RDW 21.2*  PLT 147*   Cardiac EnzymesNo results for input(s): TROPONINI in the last 168 hours. No results for input(s): TROPIPOC in the last 168 hours.  BNP Recent Labs  Lab 02/10/21 0510  BNP 1,207.4*    DDimer No results for input(s): DDIMER in the last 168 hours. TSH:  Lab Results  Component Value Date   TSH 1.246 08/13/2020   Lipids: Lab Results  Component Value Date   CHOL 116 11/12/2014   HDL 34 (L) 11/12/2014   LDLCALC 60 11/12/2014   TRIG 111 11/12/2014   CHOLHDL 3.4 11/12/2014   HgbA1c: Lab Results  Component Value Date   HGBA1C 7.0 (H) 12/08/2020    Radiology/Studies:  DG Chest 2 View  Result Date: 02/10/2021 CLINICAL DATA:  75 year old female with shortness of breath. Atrial fibrillation. Pacemaker placed 3 weeks ago. EXAM: CHEST - 2 VIEW COMPARISON:  Portable chest 01/18/2021 and earlier. FINDINGS: PA and lateral views today. Stable cardiomegaly and mediastinal contours. Stable sequelae of CABG. Stable left chest single lead transvenous pacemaker. Visualized tracheal air column is within normal limits. No pneumothorax or pleural effusion. Chronic but increased interstitial markings throughout both lungs. Chronic linear atelectasis or scarring at the lingula is stable since last year. Exaggerated thoracic kyphosis. No acute osseous abnormality identified. Negative visible bowel gas pattern. IMPRESSION: 1. Acute on chronic  interstitial markings in both lungs suspicious for interstitial edema in the setting of chronic cardiomegaly, atrial fibrillation. 2. Otherwise stable chest. Electronically Signed   By: Genevie Ann M.D.   On: 02/10/2021 04:47    Assessment and Plan:   1. Acute on chronic combined systolic and diastolic CHF: -Pt presented with a 2 days hx of SOB. HPI somewhat difficult to obtain due to patients somnolence on exam today. She is having difficulty recalling her symptoms which brought her to the hospital this admission. She states that she was SOB however cannot recall clear details when asked however reports medication compliance and shows me her medication "bag" which does not have  her home torsemide or Xarelto in it. When questioned, she states that her other medications are in her home drawer.  -Most recent echocardiogram with EF 25 to 30% 12/08/20 -EKG showed V pacing with underlying atrial fibrillation -CXR showed markings suggestive of edema.  -BNP elevated at 1207. She has mild LE edema on exam.  -Continue IV Lasix 40mg  BID and follow response closely  -Weight, 197lb  -I&O, net neg 754ml  -Continue Toprol XL 100, losartan 25 -Creatinine stable at 1.3 which appears to be at her baseline  2. Persistent atrial fibrillation with RVR: -Pt recently underwent AV nodal ablation with PPM placement 01/18/21>>discharged 01/19/21. Follow up wound check with normal device functioning.  -Will have device interrogated this admission  -Continue Toprol-XL, Xarelto for anticoagulation -CHA2DS2-VASc = 6 (CHF, HTN, age >67, CAD, female).    3. CAD s/p CABG in 2016:  -History of CABG x2 (LIMA to LAD and SVG to OM) in 2016.  -Low risk Myoview in 08/2018 -Denies anginal symptoms -Continue BB, statin -No ASA in the setting of Xarelto   4. Hypertension: -Elevated, 141/102>>139/110>>145/99 -Continue Toprol XL 100, losartan -Consider increasing Toprol to 150 DQ    5. Hyperlipidemia: -Continue home Lipitor 10mg   daily   6. Acute on Chronic CKD Stage III -Creatinine 1.30 this admission which appears to be at her most recent baseline.     For questions or updates, please contact [place] Please consult [email or web address] for contact info under Cardiology/STEMI.   Lyndel Safe NP-C [facility] Pager: [phone] 02/10/2021 1:05 PM   Patient seen and examined.  Agree with above documentation.  Ms. Harston is a 75 year old female with a history of CAD status post CABG in 2016, chronic combined systolic and diastolic heart failure (EF 25 to 30%), persistent atrial fibrillation on Xarelto status post AV nodal ablation and PPM placement, CKD stage III, COPD, hypertension, hyperlipidemia, former tobacco use who we are consulted by Dr. Wyvonnia Dusky for evaluation of heart failure.  She presented with worsening shortness of breath.  In the ED, vital signs notable for BP 145/100, pulse 88, SPO2 100% on room air.  Labs notable for creatinine 1.30 (stable from prior labs, was 1.46 on 12/31/2020), potassium 4.1, sodium 141, WBC 7.8, hemoglobin 14.1, platelets 147, BNP 1207, troponin 29 > 30, COVID-negative.  Chest x-ray with interstitial edema.  EKG shows ventricular paced rhythm, atrial fibrillation, PVCs.  On exam, patient is alert and oriented, regular rate and rhythm, no murmurs, lungs CTAB, mild LE edema, + JVD.  For her acute on chronic combined systolic and diastolic heart failure, she does appear volume overloaded on exam, would continue IV diuresis.  Continue home Toprol-XL and losartan.  She has not tolerated Entresto in the past.  For her atrial fibrillation, would continue Xarelto.  Will repeat echocardiogram.  Donato Heinz, MD

## 2021-02-10 NOTE — ED Notes (Signed)
Lab to result BNP and Trop to previously collected labs

## 2021-02-10 NOTE — ED Provider Notes (Signed)
[facility] EMERGENCY DEPARTMENT Provider Note   CSN: [id number] Arrival date & time: 02/10/21  0402     History Chief Complaint  Patient presents with   Atrial Fibrillation    [name] [name] is a 75 y.o. female.  Patient with a history of CAD s/p CABG, paroxysmal atrial fibrillation difficult control.  Recent placement of pacemaker and AV ablation about 3 weeks ago, COPD, CKD, diabetes here with increasing shortness of breath and suspected atrial fibrillation for the past 2 days.  States she said nausea productive cough with clear mucus, difficulty with increased shortness of breath unable to lie flat for the past several days.  States has been taking her medications as prescribed.  Has a cough productive of clear mucus.  No fever.  No leg swelling.  Unable to lie flat.  Did have some right-sided chest pain earlier which lasted for several hours and is since resolved. States she has been taking her medications as prescribed  The history is provided by the patient and a relative.  Atrial Fibrillation Associated symptoms include shortness of breath. Pertinent negatives include no chest pain, no abdominal pain and no headaches.      Past Medical History:  Diagnosis Date   ([place]) heart failure with improved ejection fraction ([place])    a. 07/2018 Echo: EF 30-35%; b. 11/2019 Echo: EF 55-60%, no rwma, Gr2 DD, Nl RV size/fxn. Mild BAE. Mild MR/AI.   Arthritis    Asthma    Atopic dermatitis    CAD (coronary artery disease)    a. 2016 s/p CABG x 2 (LIMA->LAD, VG->OM); b. 08/2018 MV: EF 44%, no ischemia/infact.   Cardiac arrest ([place]) 10/2014   Cardiomyopathy, ischemic    a. 07/2018 Echo: EF 30-35%; 11/2019 Echo: EF 55-60%.   Carotid arterial disease ([place])    a. 11/2019 Carotid U/S   CKD (chronic kidney disease), stage III (HCC)    COPD (chronic obstructive pulmonary disease) ([place])    Esophageal dilatation 2013   GERD (gastroesophageal reflux disease)    Gout     Headache    Hypertension    Hypokalemia    Idiopathic angioedema    LGI bleed 08/06/2017   a. felt to be hemorrhoidal during that admission (no drop in Hgb).   Lower back pain    Paroxysmal atrial fibrillation (HCC)    a. Dx 2016-->h/o difficult to control rates (complicated by noncompliance), not felt to be a candidate for ablation or antiarrhythmic due to noncompliance; b. Recurrent AF 2021 - converted w/ IV dilt; c. CHA2DS2VASc = 7-->Xarelto.   Personal history of noncompliance with medical treatment, presenting hazards to health    Prediabetes    S/P CABG x 2 with clipping of LA appendage 11/14/2014   LIMA to LAD, SVG to OM, EVH via right thigh   Urine incontinence    Uterine fibroid     Patient Active Problem List   Diagnosis Date Noted   Acute respiratory failure with hypoxia ([place]) 12/08/2020   Hypotension 12/08/2020   Hypoalbuminemia 12/08/2020   Obstructive sleep apnea 09/21/2020   Atrial fibrillation with rapid ventricular response ([place]) 08/13/2020   Acute lower GI bleeding 02/10/2020   Tobacco dependence 02/10/2020   Obesity (BMI 30.0-34.9) 02/10/2020   Acute upper GI bleeding 10/26/2019   COPD (chronic obstructive pulmonary disease) ([place]) 10/26/2019   Acute GI bleeding 10/09/2019   Acute blood loss anemia 10/09/2019   Postmenopausal vaginal bleeding 01/04/2018   Snoring 01/04/2018   Acute on chronic  combined systolic and diastolic CHF (congestive heart failure) ([place])    CHF exacerbation ([place]) 10/16/2017   Noncompliance 10/16/2017   Acute on chronic congestive heart failure (HCC)    Thrombocytopenia ([place]) 09/14/2017   Acute drug-induced gout of right foot    Medication noncompliance due to cognitive impairment 16/05/9603   Acute diastolic CHF (congestive heart failure) ([place]) 08/06/2017   LGI bleed, likely hemorrhoids 08/06/2017   Persistent atrial fibrillation ([place]) 08/04/2017   Paroxysmal atrial fibrillation ([place]) 02/08/2017   Cardiomyopathy, ischemic 02/08/2017    Dysphagia 02/08/2017   CKD (chronic kidney disease), stage III ([place]) 01/30/2017   Pain in shoulder 02/08/2016   Breast pain, left 02/08/2016   Bilateral arm numbness and tingling while sleeping 02/08/2016   Painful lumpy left breast 09/23/2015   [place] intertrigo 02/11/2015   S/P CABG x 2 11/14/2014   Accelerated hypertension    Cardiac arrest ([place]) 11/04/2014   Left main coronary artery disease 11/04/2014   Coronary artery disease due to lipid rich plaque    Acute respiratory failure with hypoxemia [facility])    Essential hypertension    Atrial fibrillation with RVR (HCC)    Hypokalemia 10/30/2014   Chronic combined systolic and diastolic CHF (congestive heart failure) ([place]) 10/30/2014   NSTEMI (non-ST elevated myocardial infarction) ([place])    DOE (dyspnea on exertion)    CAP (community acquired pneumonia) 10/29/2014    Past Surgical History:  Procedure Laterality Date   AV NODE ABLATION N/A 01/18/2021   Procedure: AV NODE ABLATION;  Surgeon: Evans Lance, MD;  Location: [place] [facility];  Service: Cardiovascular;  Laterality: N/A;   BALLOON DILATION N/A 10/10/2019   Procedure: BALLOON DILATION;  Surgeon: Otis Brace, MD;  Location: [facility]OS[facility];  Service: Gastroenterology;  Laterality: N/A;   BIOPSY  10/10/2019   Procedure: BIOPSY;  Surgeon: Otis Brace, MD;  Location: [place];  Service: Gastroenterology;;   BIOPSY  10/11/2019   Procedure: BIOPSY;  Surgeon: Otis Brace, MD;  Location: [place];  Service: Gastroenterology;;   CARDIOVERSION N/A 11/18/2014   Procedure: CARDIOVERSION;  Surgeon: [name], MD;  Location: [place];  Service: Cardiovascular;  Laterality: N/A;   CARDIOVERSION N/A 12/25/2017   Procedure: CARDIOVERSION;  Surgeon: [name], MD;  Location: [place];  Service: Cardiovascular;  Laterality: N/A;   CLIPPING OF ATRIAL APPENDAGE N/A 11/14/2014   Procedure: CLIPPING OF ATRIAL APPENDAGE;  Surgeon: [name],  MD;  Location: [place];  Service: Open Heart Surgery;  Laterality: N/A;   COLONOSCOPY  2013   COLONOSCOPY WITH PROPOFOL N/A 10/11/2019   Procedure: COLONOSCOPY WITH PROPOFOL;  Surgeon: Otis Brace, MD;  Location: [place];  Service: Gastroenterology;  Laterality: N/A;   CORONARY ARTERY BYPASS GRAFT N/A 11/14/2014   Procedure: CORONARY ARTERY BYPASS GRAFTING (CABG)TIMES 2 USING LEFT INTERNAL MAMMARY ARTERY AND RIGHT SAPHENOUS VEIN HARVESTED ENDOSCOPICALLY;  Surgeon: [name], MD;  Location: [place];  Service: Open Heart Surgery;  Laterality: N/A;   ESOPHAGOGASTRODUODENOSCOPY (EGD) WITH PROPOFOL N/A 10/10/2019   Procedure: ESOPHAGOGASTRODUODENOSCOPY (EGD) WITH PROPOFOL;  Surgeon: Otis Brace, MD;  Location: [place];  Service: Gastroenterology;  Laterality: N/A;   ESOPHAGOGASTRODUODENOSCOPY (EGD) WITH PROPOFOL N/A 10/27/2019   Procedure: ESOPHAGOGASTRODUODENOSCOPY (EGD) WITH PROPOFOL;  Surgeon: Ronnette Juniper, MD;  Location: [place];  Service: Gastroenterology;  Laterality: N/A;   GIVENS CAPSULE STUDY N/A 10/27/2019   Procedure: GIVENS CAPSULE STUDY;  Surgeon: Ronnette Juniper, MD;  Location: [place];  Service: Gastroenterology;  Laterality: N/A;   LEFT  HEART CATHETERIZATION WITH CORONARY ANGIOGRAM N/A 11/04/2014   Procedure: LEFT HEART CATHETERIZATION WITH CORONARY ANGIOGRAM;  Surgeon: [place] Sine, MD;  Location: [facility] CATH LAB;  Service: Cardiovascular;  Laterality: N/A;   PACEMAKER IMPLANT N/A 01/18/2021   Procedure: PACEMAKER IMPLANT;  Surgeon: Evans Lance, MD;  Location: [place] [facility];  Service: Cardiovascular;  Laterality: N/A;   PACEMAKER IMPLANT Left    POLYPECTOMY  10/11/2019   Procedure: POLYPECTOMY;  Surgeon: Otis Brace, MD;  Location: [place] ENDOSCOPY;  Service: Gastroenterology;;   TEE WITHOUT CARDIOVERSION N/A 11/14/2014   Procedure: TRANSESOPHAGEAL ECHOCARDIOGRAM (TEE);  Surgeon: [name], MD;  Location: [place];  Service: Open Heart Surgery;   Laterality: N/A;   TEE WITHOUT CARDIOVERSION N/A 12/25/2017   Procedure: TRANSESOPHAGEAL ECHOCARDIOGRAM (TEE);  Surgeon: [name], MD;  Location: [facility] ENDOSCOPY;  Service: Cardiovascular;  Laterality: N/A;   TEMPORARY PACEMAKER INSERTION  11/04/2014   Procedure: TEMPORARY PACEMAKER INSERTION;  Surgeon: [place] Sine, MD;  Location: [facility] CATH LAB;  Service: Cardiovascular;;   TOOTH EXTRACTION       OB History   No obstetric history on file.     Family History  Problem Relation Age of Onset   Cancer Mother        LYMPHOMA   Heart disease Father    Other Father        TB   CVA Sister    Prostate cancer Brother 46   Diabetes Brother     Social History   Tobacco Use   Smoking status: Every Day    Packs/day: 0.50    Years: 56.00    Pack years: 28.00    Types: Cigarettes   Smokeless tobacco: Never   Tobacco comments:    smoking 2 cigarettes/day as of 10/23/20  Vaping Use   Vaping Use: Never used  Substance Use Topics   Alcohol use: No    Alcohol/week: 0.0 standard drinks   Drug use: No    Home Medications Prior to Admission medications   Medication Sig Start Date End Date Taking? Authorizing Provider  acetaminophen (TYLENOL) 325 MG tablet Take 2 tablets (650 mg total) by mouth every 4 (four) hours as needed for headache or mild pain. 12/09/20   Mikhail, Velta Addison, DO  allopurinol (ZYLOPRIM) 300 MG tablet Take 1 tablet (300 mg total) by mouth daily. 01/04/18   Georgiana Shore, NP  ferrous sulfate 325 (65 FE) MG tablet Take 162.5 mg by mouth daily with breakfast.    [provider]  losartan (COZAAR) 25 MG tablet Take 1 tablet (25 mg total) by mouth daily. 10/19/20 01/17/21  Baldwin Jamaica, PA-C  metFORMIN (GLUCOPHAGE) 500 MG tablet Take 500 mg by mouth daily with breakfast. Daily with a meal    [provider]  metoprolol succinate (TOPROL-XL) 100 MG 24 hr tablet Take 1 tablet (100 mg total) by mouth 2 (two) times daily. Take with or immediately  following a meal. 02/04/21 05/05/21  Evans Lance, MD  pantoprazole (PROTONIX) 40 MG tablet Take 40 mg by mouth in the morning.    [provider]  rivaroxaban (XARELTO) 20 MG TABS tablet Take 1 tablet (20 mg total) by mouth daily with supper. Resume from 11/01/2019 11/01/19   Aline August, MD  Tiotropium Bromide Monohydrate (SPIRIVA RESPIMAT) 2.5 MCG/ACT AERS Inhale 2 puffs into the lungs daily. Patient taking differently: Inhale 2 puffs into the lungs daily as needed (asthma). 10/23/20   Parrett, Fonnie Mu, NP  torsemide (  DEMADEX) 20 MG tablet TAKE TWO TABLETS BY MOUTH ONCE DAILY Patient taking differently: Take 40 mg by mouth daily. 12/22/20   Evans Lance, MD    Allergies    Bee venom, Ivp dye [iodinated diagnostic agents], Codeine, Entresto [sacubitril-valsartan], Shrimp [shellfish allergy], and Jardiance [empagliflozin]  Review of Systems   Review of Systems  Constitutional:  Negative for activity change, appetite change, fatigue and fever.  HENT:  Negative for congestion.   Respiratory:  Positive for chest tightness and shortness of breath.   Cardiovascular:  Negative for chest pain and leg swelling.  Gastrointestinal:  Negative for abdominal pain, nausea and vomiting.  Genitourinary:  Negative for dysuria and hematuria.  Musculoskeletal:  Negative for arthralgias and myalgias.  Skin:  Negative for rash.  Neurological:  Negative for dizziness, weakness and headaches.   all other systems are negative except as noted in the HPI and PMH.   Physical Exam Updated Vital Signs BP (!) 141/105 (BP Location: Right Arm)   Pulse 91   Temp 98.5 F (36.9 C) (Oral)   Resp (!) 22   SpO2 99%   Physical Exam Vitals and nursing note reviewed.  Constitutional:      General: She is in acute distress.     Appearance: She is well-developed.     Comments: Dyspneic with convsersation  HENT:     Head: Normocephalic and atraumatic.     Mouth/Throat:     Pharynx: No oropharyngeal  exudate.  Eyes:     Conjunctiva/sclera: Conjunctivae normal.     Pupils: Pupils are equal, round, and reactive to light.  Neck:     Comments: No meningismus. Cardiovascular:     Rate and Rhythm: Normal rate and regular rhythm.     Heart sounds: Normal heart sounds. No murmur heard. Pulmonary:     Effort: Pulmonary effort is normal. No respiratory distress.     Breath sounds: Rales present.  Abdominal:     Palpations: Abdomen is soft.     Tenderness: There is no abdominal tenderness. There is no guarding or rebound.  Musculoskeletal:        General: No tenderness. Normal range of motion.     Cervical back: Normal range of motion and neck supple.  Skin:    General: Skin is warm.  Neurological:     Mental Status: She is alert and oriented to person, place, and time.     Cranial Nerves: No cranial nerve deficit.     Motor: No abnormal muscle tone.     Coordination: Coordination normal.     Comments:  5/5 strength throughout. CN 2-12 intact.Equal grip strength.   Psychiatric:        Behavior: Behavior normal.    ED Results / Procedures / Treatments   Labs (all labs ordered are listed, but only abnormal results are displayed) Labs Reviewed  BASIC METABOLIC PANEL - Abnormal; Notable for the following components:      Result Value   Chloride 113 (*)    CO2 18 (*)    Glucose, Bld 129 (*)    BUN 30 (*)    Creatinine, Ser 1.30 (*)    GFR, Estimated 43 (*)    All other components within normal limits  CBC - Abnormal; Notable for the following components:   RBC 5.24 (*)    RDW 21.2 (*)    Platelets 147 (*)    All other components within normal limits  BRAIN NATRIURETIC PEPTIDE - Abnormal; Notable for the following  components:   B Natriuretic Peptide 1,207.4 (*)    All other components within normal limits  TROPONIN I (HIGH SENSITIVITY) - Abnormal; Notable for the following components:   Troponin I (High Sensitivity) 29 (*)    All other components within normal limits   TROPONIN I (HIGH SENSITIVITY) - Abnormal; Notable for the following components:   Troponin I (High Sensitivity) 30 (*)    All other components within normal limits  RESP PANEL BY RT-PCR (FLU A&B, COVID) [id number]    EKG EKG Interpretation  Date/Time:  Wednesday February 10 2021 04:12:34 EDT Ventricular Rate:  94 PR Interval:    QRS Duration: 94 QT Interval:  352 QTC Calculation: 440 R Axis:   19 Text Interpretation: VENTRICULAR PACED RHYTHM and Premature supraventricular complexes Minimal voltage criteria for LVH, may be normal variant ( Cornell product ) ST & T wave abnormality, consider inferolateral ischemia Abnormal ECG Confirmed by Ezequiel Essex [phone]) on 02/10/2021 4:49:49 AM  Radiology DG Chest 2 View  Result Date: 02/10/2021 CLINICAL DATA:  75 year old female with shortness of breath. Atrial fibrillation. Pacemaker placed 3 weeks ago. EXAM: CHEST - 2 VIEW COMPARISON:  Portable chest 01/18/2021 and earlier. FINDINGS: PA and lateral views today. Stable cardiomegaly and mediastinal contours. Stable sequelae of CABG. Stable left chest single lead transvenous pacemaker. Visualized tracheal air column is within normal limits. No pneumothorax or pleural effusion. Chronic but increased interstitial markings throughout both lungs. Chronic linear atelectasis or scarring at the lingula is stable since last year. Exaggerated thoracic kyphosis. No acute osseous abnormality identified. Negative visible bowel gas pattern. IMPRESSION: 1. Acute on chronic interstitial markings in both lungs suspicious for interstitial edema in the setting of chronic cardiomegaly, atrial fibrillation. 2. Otherwise stable chest. Electronically Signed   By: Genevie Ann M.D.   On: 02/10/2021 04:47    Procedures Procedures   Medications Ordered in ED Medications  furosemide (LASIX) injection 40 mg (has no administration in time range)    ED Course  I have reviewed the triage vital signs and the nursing  notes.  Pertinent labs & imaging results that were available during my care of the patient were reviewed by me and considered in my medical decision making (see chart for details).    MDM Rules/Calculators/A&P                         Shortness of breath with recent pacemaker placement and AV node ablation.  She appears to be paced on arrival with underlying atrial fibrillation.  X-ray concerning for interstitial edema. Will interrogate pacemaker.  IV Lasix given  D/w Dr. Hassell Done of cardiology.  She recommends hospitalist admission and cardiology consult.  Labs reassuring. Cr at baseline. BNP 1200. Troponin minimally elevated.  Low suspicion for ACS.  With increased work of breathing and dyspnea, will plan for admission. D/w Dr. Marlowe Sax. Final Clinical Impression(s) / ED Diagnoses Final diagnoses:  Acute on chronic combined systolic and diastolic congestive heart failure [facility])    Rx / DC Orders ED Discharge Orders     None        [place] Yeske, Annie Main, MD 02/10/21 [phone]

## 2021-02-11 ENCOUNTER — Inpatient Hospital Stay (HOSPITAL_COMMUNITY): Payer: HMO

## 2021-02-11 DIAGNOSIS — I10 Essential (primary) hypertension: Secondary | ICD-10-CM

## 2021-02-11 DIAGNOSIS — I5043 Acute on chronic combined systolic (congestive) and diastolic (congestive) heart failure: Secondary | ICD-10-CM

## 2021-02-11 DIAGNOSIS — I5023 Acute on chronic systolic (congestive) heart failure: Secondary | ICD-10-CM

## 2021-02-11 DIAGNOSIS — N1831 Chronic kidney disease, stage 3a: Secondary | ICD-10-CM

## 2021-02-11 DIAGNOSIS — E669 Obesity, unspecified: Secondary | ICD-10-CM

## 2021-02-11 DIAGNOSIS — J441 Chronic obstructive pulmonary disease with (acute) exacerbation: Secondary | ICD-10-CM

## 2021-02-11 DIAGNOSIS — I4819 Other persistent atrial fibrillation: Secondary | ICD-10-CM

## 2021-02-11 LAB — ECHOCARDIOGRAM LIMITED
Area-P 1/2: 3.48 cm2
Height: 67.5 in
MV M vel: 5.22 m/s
MV Peak grad: 109 mmHg
P 1/2 time: 264 msec
S' Lateral: 3.5 cm
Weight: 3128 oz

## 2021-02-11 LAB — GLUCOSE, CAPILLARY
Glucose-Capillary: 196 mg/dL — ABNORMAL HIGH (ref 70–99)
Glucose-Capillary: 310 mg/dL — ABNORMAL HIGH (ref 70–99)
Glucose-Capillary: 251 mg/dL — ABNORMAL HIGH (ref 70–99)
Glucose-Capillary: 159 mg/dL — ABNORMAL HIGH (ref 70–99)

## 2021-02-11 LAB — MAGNESIUM: Magnesium: 2.1 mg/dL (ref 1.7–2.4)

## 2021-02-11 LAB — BASIC METABOLIC PANEL
Anion gap: 13 (ref 5–15)
BUN: 34 mg/dL — ABNORMAL HIGH (ref 8–23)
CO2: 19 mmol/L — ABNORMAL LOW (ref 22–32)
Calcium: 9.5 mg/dL (ref 8.9–10.3)
Chloride: 107 mmol/L (ref 98–111)
Creatinine, Ser: 1.57 mg/dL — ABNORMAL HIGH (ref 0.44–1.00)
GFR, Estimated: 34 mL/min — ABNORMAL LOW (ref >60)
Glucose, Bld: 211 mg/dL — ABNORMAL HIGH (ref 70–99)
Potassium: 4.1 mmol/L (ref 3.5–5.1)
Sodium: 139 mmol/L (ref 135–145)

## 2021-02-11 LAB — HEMOGLOBIN A1C
Hgb A1c MFr Bld: 6.8 % — ABNORMAL HIGH (ref 4.8–5.6)
Mean Plasma Glucose: 148 mg/dL

## 2021-02-11 MED ORDER — SALINE SPRAY 0.65 % NA SOLN
1.0000 | NASAL | Status: DC | PRN
  Administered 2021-02-11: 1 via NASAL
  Filled 2021-02-11: qty 44

## 2021-02-11 MED ORDER — ENSURE ENLIVE PO LIQD: 237.0000 mL | ORAL | Status: DC

## 2021-02-11 MED ORDER — ADULT MULTIVITAMIN W/MINERALS CH
1.0000 | ORAL_TABLET | Freq: Every day | ORAL | Status: DC
  Administered 2021-02-11 – 2021-02-12 (×2): 1 via ORAL
  Filled 2021-02-11 (×2): qty 1

## 2021-02-11 MED ORDER — IPRATROPIUM-ALBUTEROL 0.5-2.5 (3) MG/3ML IN SOLN
3.0000 mL | Freq: Two times a day (BID) | RESPIRATORY_TRACT | Status: DC
  Administered 2021-02-11: 3 mL via RESPIRATORY_TRACT
  Filled 2021-02-11: qty 3

## 2021-02-11 NOTE — Progress Notes (Signed)
   02/11/21 1600  Clinical Encounter Type  Visited With Patient  Visit Type Social support;Spiritual support  Referral From Nurse  Consult/Referral To Chaplain  Spiritual Encounters  Spiritual Needs Prayer;Emotional  The chaplain responded to consult for prayer. The patient spoke about her deceased loved ones and her own medical condition. The chaplain provided the patient with social and emotional support. The patient was missing her deceased brother. The patient requested prayer. The chaplain will follow up as needed.

## 2021-02-11 NOTE — Progress Notes (Signed)
Progress Note  Patient Name: [name] [name] Date of Encounter: 02/11/2021  [facility] Cardiologist: [name], MD   Subjective   Incomplete I/Os.  Creatinine increased from 1.3 to 1.6.  Weight down from 197 to 195 pounds. BP 135/95.  Denies any chest pain.  Reports dyspnea improved.  Inpatient Medications    Scheduled Meds:  allopurinol  300 mg Oral Daily   atorvastatin  10 mg Oral QHS   furosemide  40 mg Intravenous BID   guaiFENesin  600 mg Oral BID   insulin aspart  0-5 Units Subcutaneous QHS   insulin aspart  0-9 Units Subcutaneous TID WC   ipratropium-albuterol  3 mL Nebulization QID   losartan  25 mg Oral Daily   metoprolol succinate  100 mg Oral BID   pantoprazole  40 mg Oral q AM   predniSONE  40 mg Oral Q breakfast   rivaroxaban  20 mg Oral Q supper   sodium chloride flush  3 mL Intravenous Q12H   Continuous Infusions:  sodium chloride     PRN Meds: sodium chloride, acetaminophen, albuterol, ondansetron (ZOFRAN) IV, sodium chloride flush   Vital Signs    Vitals:   02/10/21 2003 02/10/21 2021 02/11/21 0301 02/11/21 0748  BP: (!) 142/99  (!) 145/96 (!) 135/95  Pulse: 92  92 96  Resp: 20  16 18   Temp: 97.6 F (36.4 C)  98 F (36.7 C) (!) 97.5 F (36.4 C)  TempSrc: Oral  Oral Oral  SpO2: 99% 92% 95% (!) 88%  Weight:   88.7 kg   Height:        Intake/Output Summary (Last 24 hours) at 02/11/2021 0834 Last data filed at 02/10/2021 2100 Gross per 24 hour  Intake 960 ml  Output 1550 ml  Net -590 ml   Last 3 Weights 02/11/2021 02/10/2021 01/19/2021  Weight (lbs) 195 lb 8 oz 197 lb 3.2 oz 197 lb 15.6 oz  Weight (kg) 88.678 kg 89.449 kg 89.8 kg      Telemetry    V paced at 90, 13 beat run of NSVT- Personally Reviewed  ECG    No new ECG - Personally Reviewed  Physical Exam   GEN: No acute distress.   Neck: + JVD Cardiac: RRR, no murmurs, rubs, or gallops.  Respiratory: Clear to auscultation bilaterally. GI: Soft, nontender, non-distended   MS: trace edema Neuro:  Nonfocal  Psych: Normal affect   Labs    High Sensitivity Troponin:   Recent Labs  Lab 02/10/21 0500 02/10/21 0700  TROPONINIHS 29* 30*      Chemistry Recent Labs  Lab 02/10/21 0417 02/11/21 0203  NA 141 139  K 4.1 4.1  CL 113* 107  CO2 18* 19*  GLUCOSE 129* 211*  BUN 30* 34*  CREATININE 1.30* 1.57*  CALCIUM 9.6 9.5  GFRNONAA 43* 34*  ANIONGAP 10 13     Hematology Recent Labs  Lab 02/10/21 0417  WBC 7.8  RBC 5.24*  HGB 14.1  HCT 45.0  MCV 85.9  MCH 26.9  MCHC 31.3  RDW 21.2*  PLT 147*    BNP Recent Labs  Lab 02/10/21 0510  BNP 1,207.4*     DDimer No results for input(s): DDIMER in the last 168 hours.   Radiology    DG Chest 2 View  Result Date: 02/10/2021 CLINICAL DATA:  75 year old female with shortness of breath. Atrial fibrillation. Pacemaker placed 3 weeks ago. EXAM: CHEST - 2 VIEW COMPARISON:  Portable chest 01/18/2021 and  earlier. FINDINGS: PA and lateral views today. Stable cardiomegaly and mediastinal contours. Stable sequelae of CABG. Stable left chest single lead transvenous pacemaker. Visualized tracheal air column is within normal limits. No pneumothorax or pleural effusion. Chronic but increased interstitial markings throughout both lungs. Chronic linear atelectasis or scarring at the lingula is stable since last year. Exaggerated thoracic kyphosis. No acute osseous abnormality identified. Negative visible bowel gas pattern. IMPRESSION: 1. Acute on chronic interstitial markings in both lungs suspicious for interstitial edema in the setting of chronic cardiomegaly, atrial fibrillation. 2. Otherwise stable chest. Electronically Signed   By: Genevie Ann M.D.   On: 02/10/2021 04:47    Cardiac Studies   Echo 12/08/20:  1. Left ventricular ejection fraction, by estimation, is 25 to 30%. Left  ventricular ejection fraction by 3D volume is 22 %. The left ventricle has  severely decreased function. The left ventricle  demonstrates global  hypokinesis. The left ventricular  internal cavity size was mildly dilated. There is mild concentric left  ventricular hypertrophy. Left ventricular diastolic function could not be  evaluated.   2. Right ventricular systolic function is moderately reduced. The right  ventricular size is moderately enlarged. There is moderately elevated  pulmonary artery systolic pressure. The estimated right ventricular  systolic pressure is 97.9 mmHg.   3. Left atrial size was severely dilated.   4. Right atrial size was moderately dilated.   5. The mitral valve is grossly normal. Mild to moderate mitral valve  regurgitation.   6. Tricuspid valve regurgitation is moderate.   7. The aortic valve is tricuspid. There is moderate calcification of the  aortic valve. There is mild thickening of the aortic valve. Aortic valve  regurgitation is trivial. Mild to moderate aortic valve  sclerosis/calcification is present, without any  evidence of aortic stenosis.   8. The inferior vena cava is dilated in size with <50% respiratory  variability, suggesting right atrial pressure of 15 mmHg.   Patient Profile     75 y.o. female with a hx of CAD s/p CABG x2 in 2016, chronic combined CHF/ischemic cardiomyopathy with EF of 30-35% on Echo in 2019 with normalization per last echocardiogram, persistent atrial fibrillation on Xarelto s/p recent AV nodal ablation with PPM placement, COPD, hypertension, hyperlipidemia, pre-diabetes, CKD stage III, GERD, medication non-compliance, and prior tobacco use (quit in 2016)  Assessment & Plan    Acute on chronic combined systolic and diastolic CHF: EF 89-21%.  Presented with worsening shortness of breath.  Chest x-ray suggests edema.  BNP elevated at 1200. -Continues to appear mildly volume overloaded on exam, JVD elevated. Continue IV diuresis.  Mild bump in Cr (1.6 today, baseline appears 1.3-1.5), will monitor closely -Strict I/Os and daily weights -Repeat  echo today -Continue Toprol XL 100, losartan 25 -Previously did not tolerate Entresto or Jardiance   Permanent atrial fibrillation: Pt recently underwent AV nodal ablation with PPM placement 01/18/21>>discharged 01/19/21. Follow up wound check with normal device functioning. -Will have device interrogated this admission  -Continue Toprol-XL, Xarelto for anticoagulation -CHA2DS2-VASc = 6 (CHF, HTN, age >6, CAD, female).    CAD s/p CABG in 2016:  -History of CABG x2 (LIMA to LAD and SVG to OM) in 2016.  -Low risk Myoview in 08/2018 -Denies anginal symptoms -Continue BB, statin -No ASA in the setting of Xarelto   Hypertension: -Stable -Continue Toprol XL 100, losartan   Hyperlipidemia: -Continue home Lipitor 10mg  daily   Acute on Chronic CKD Stage III -Creatinine 1.57 this admission which  appears to be above baseline 1.3-1.5, will monitor with diuresis   For questions or updates, please contact [place] Please consult [email or web address] for contact info under      Signed, Donato Heinz, MD  02/11/2021, 8:34 AM

## 2021-02-11 NOTE — Therapy (Signed)
Occupational Therapy Evaluation Patient Details Name: [name] [name] MRN: [id number] DOB: [birth year] Today's Date: 02/11/2021    History of Present Illness [name] [name] is a 75 y.o. female admitted for CHF. PMH: A. Fib s/p PPM and AV node ablation, HTN, HLD, CHF with EF 25-30%, CAD s/p CABG in 2016, cardiac arrest, diabetes mellitus type 2, CKD stage III, COPD, GERD, and gout presents with complaints of shortness of breath over the last 2 days. Patient just recently had undergone AV ablation with pacemaker placed 3 weeks ago by Dr. Lovena Le, and was mild hospitalized 2 months ago for acute respiratory failure with hypoxia secondary to acute on chronic systolic congestive heart failure exacerbation.  Over the last 1-2 weeks she reports that she been having difficulty with shortness of breath whenever trying to lay down at night. Pt reports smoking 2-3 cigarettes per day   Clinical Impression   Pt presents w/ CHF and demonstrates deficits in areas of safety awareness, awareness of precautions, lives alone. She appears to be close to baseline level & is currently supervision for transfers secondary to safety. She may benefit from OT for pt education, energy conservation techniques, ADL retraining with goal to be modified independent - independent overall. She completed ADL retraining session today to include bed mobility, functional mobility and transfers, toileting and grooming/UB bathing. Will follow for OT 2x/week.    Follow Up Recommendations  No OT follow up;Supervision - Intermittent    Equipment Recommendations  None recommended by OT    Recommendations for Other Services PT consult     Precautions / Restrictions Precautions Precautions: Fall;ICD/Pacemaker (Pacemaker) Restrictions Weight Bearing Restrictions: No      Mobility Bed Mobility Overal bed mobility: Independent                  Transfers Overall transfer level: Needs assistance Equipment used: None (Pt has SPC  but declined using today) Transfers: Sit to/from [facility] Sit to Stand: Modified independent (Device/Increase time) Stand pivot transfers: Supervision (Due to O2 lines)       General transfer comment:  (Pt appers close to baseline, she was supervision in room due to O2 lines but is likely Mod I.)    Balance Overall balance assessment: No apparent balance deficits (not formally assessed)                                         ADL either performed or assessed with clinical judgement   ADL Overall ADL's : Needs assistance/impaired (Supervision assist for O2 lines and safety. Pt appears to be close to baseline)     Grooming: Wash/dry hands;Wash/dry face;Modified independent;Sitting   Upper Body Bathing: Modified independent;Sitting;Set up   Lower Body Bathing: Supervison/ safety;Sit to/from stand   Upper Body Dressing : Modified independent;Sitting   Lower Body Dressing: Supervision/safety;Sit to/from stand;Sitting/lateral leans   Toilet Transfer: Supervision/safety;Ambulation;BSC;Stand-pivot   Toileting- Water quality scientist and Hygiene: Sitting/lateral lean;Sit to/from stand;Supervision/safety       Functional mobility during ADLs: Supervision/safety (Pt has SPC but declined use during ADL's today, she states that she only uses sometimes for stairs.) General ADL Comments:  (Pt became more engaged and talkative over course of OT assessment today)     Vision   Vision Assessment?: No apparent visual deficits     Perception     Praxis      Pertinent Vitals/Pain Pain Assessment: No/denies pain  Hand Dominance Right   Extremity/Trunk Assessment Upper Extremity Assessment Upper Extremity Assessment: Overall WFL for tasks assessed   Lower Extremity Assessment Lower Extremity Assessment: Defer to PT evaluation;Overall WFL for tasks assessed       Communication Communication Communication: Other (comment) (SOB during  conversation noted)   Cognition Arousal/Alertness: Awake/alert (reports fatigue) Behavior During Therapy: Flat affect (Fatigued, falling asleep off and on during assessment, appears confused at times, difficulty answering questions) Overall Cognitive Status: No family/caregiver present to determine baseline cognitive functioning (Impaored/different from baseline) Area of Impairment: Memory;Problem solving;Safety/judgement;Following commands;Attention                     Memory: Decreased short-term memory;Decreased recall of precautions Following Commands: Follows one step commands with increased time Safety/Judgement: Decreased awareness of deficits;Decreased awareness of safety   Problem Solving: Requires verbal cues;Slow processing     General Comments       Exercises     Shoulder Instructions      Home Living Family/patient expects to be discharged to:: Private residence Living Arrangements: Alone Available Help at Discharge: Family;Friend(s);Available PRN/intermittently (Avail in evenings/night only) Type of Home: House Home Access: Stairs to enter [employer] of Steps: 3 Entrance Stairs-Rails: Can reach both Home Layout: One level     Bathroom Shower/Tub: Teacher, early years/pre: Standard     Home Equipment: [employer] - single point          Prior Functioning/Environment Level of Independence: Independent with assistive device(s)        Comments: Pt reports that she sometimes uses SPC when walking up stairs "It depends what's hurting me"        OT Problem List: Decreased activity tolerance;Decreased safety awareness;Decreased knowledge of precautions      OT Treatment/Interventions: Self-care/ADL training;Energy conservation;Patient/family education    OT Goals(Current goals can be found in the care plan section) Acute Rehab OT Goals Patient Stated Goal:  (Go home tomorrow morning I hope) OT Goal Formulation: With patient Time  For Goal Achievement: 02/25/21 Potential to Achieve Goals: Good ADL Goals Pt Will Transfer to Toilet: Independently;ambulating Pt Will Perform Toileting - Clothing Manipulation and hygiene: Independently;sit to/from stand;sitting/lateral leans Pt Will Perform Tub/Shower Transfer: Tub transfer;with modified independence;ambulating;shower seat Additional ADL Goal #1: Pt will independently state 2-3 energy conservation techniques for ADL's and implement  OT Frequency: Min 2X/week   Barriers to D/C:            Co-evaluation              AM-PAC OT "6 Clicks" Daily Activity     Outcome Measure Help from another person eating meals?: None Help from another person taking care of personal grooming?: A Little Help from another person toileting, which includes using toliet, bedpan, or urinal?: A Little Help from another person bathing (including washing, rinsing, drying)?: A Little Help from another person to put on and taking off regular upper body clothing?: None Help from another person to put on and taking off regular lower body clothing?: A Little 6 Click Score: 20   End of Session Equipment Utilized During Treatment: Oxygen;Gait belt Nurse Communication: Mobility status;Other (comment) (Pt completed ADL's, overall mobility supervision-Mod I)  Activity Tolerance: Patient tolerated treatment well Patient left: in bed;with call bell/phone within reach;with bed alarm set  OT Visit Diagnosis: Muscle weakness (generalized) (M62.81);History of falling (Z91.81);Other (comment) (SOB)                Time: [phone]  OT Time Calculation (min): 42 min Charges:  OT General Charges $OT Visit: 1 Visit OT Evaluation $OT Eval Low Complexity: 1 Low OT Treatments $Self Care/Home Management : 23-37 mins Almyra Deforest, OTR/L 02/11/2021, 9:27 AM

## 2021-02-11 NOTE — Progress Notes (Signed)
Initial Nutrition Assessment  DOCUMENTATION CODES:  Not applicable  INTERVENTION:  Add Ensure Enlive po daily, each supplement provides 350 kcal and 20 grams of protein.  Add Magic cup TID with meals, each supplement provides 290 kcal and 9 grams of protein.  Add MVI with minerals daily.  NUTRITION DIAGNOSIS:  Increased nutrient needs related to acute illness as evidenced by estimated needs.  GOAL:  Patient will meet greater than or equal to 90% of their needs  MONITOR:  PO intake, Supplement acceptance, Labs, Weight trends, I & O's  REASON FOR ASSESSMENT:  Consult Assessment of nutrition requirement/status  ASSESSMENT:  75 yo female with a PMH of CAD s/p CABG x2 in 2016, chronic combined CHF/ischemic cardiomyopathy with EF of 30-35% on Echo in 2019 with normalization per last echocardiogram, persistent A-fib s/p recent AV nodal ablation with PPM placement, COPD, HTN, HLD, pre-diabetes, CKD stage 3, GERD, medication non-compliance, and prior tobacco use (quit in 2016) who presents with acute on chronic CHF.  RD working remotely.  Per chart review, pt does not endorse any changes in appetite or PO intake. MST score is reported as zero for this admission.   Attempted to call patient for history via phone. Pt did not answer.  Per Epic, pt's weight has remained stable for the last 7 months between 88-90 kg.  Recommend adding Magic Cup TID and Ensure Enlive once daily to promote intake, as well as MVI with minerals daily.  Medications: reviewed; Lasix BID, SSI, Protonix, prednisone  Labs: reviewed; CBG 109-310 HbA1c: 7% (11/2020)  NUTRITION - FOCUSED PHYSICAL EXAM: Unable to perform  Diet Order:   Diet Order             Diet Heart Room service appropriate? Yes; Fluid consistency: Thin  Diet effective now                  EDUCATION NEEDS:  No education needs have been identified at this time  Skin:  Skin Assessment: Reviewed RN Assessment  Last BM:   02/10/21  Height:  Ht Readings from Last 1 Encounters:  02/10/21 5' 7.5" (1.715 m)   Weight:  Wt Readings from Last 1 Encounters:  02/11/21 88.7 kg   Ideal Body Weight:  62.5 kg  BMI:  Body mass index is 30.17 kg/m.  Estimated Nutritional Needs:  Kcal:  5670-1410 Protein:  100-115 grams Fluid:  >1.75 L  Derrel Nip, RD, LDN (she/her/hers) Registered Dietitian I After-Hours/Weekend Pager # in [employer]

## 2021-02-11 NOTE — Progress Notes (Addendum)
[name] NOTE    [name]  ZOX:[id number] DOB: Feb 28, 1946 DOA: 02/10/2021 PCP: [name], MD    Brief Narrative:  [name] [name] was admitted to the hospital with the working diagnosis of systolic heart failure decompensation.   75 yo female with the past medical history of atrial fibrillation, htn, systolic heart failure, W1XB, COPD, GERD,CKD stage 2. She reported one to two weeks of dyspnea, along with orthopnea.  Positive cough and productive sputum.  Decreased physical functional capacity and right-sided chest pain. 3 weeks prior CAD had AV ablation and pacemaker implantation. On her initial physical examination blood pressure 108/48, heart rate 90, respiratory rate 34, temperature is 10.9, oxygen saturation 97%, lungs with expiratory wheezing, heart S1-S2, irregularly irregular, abdomen soft nontender, trace bilateral lower extremity edema.  Sodium 141, potassium 4.1, chloride 113, bicarb 18, glucose 129, BUN 30, creatinine 1.30, BNP 1207, high sensitive troponin 29-30, white count 7.8, hemoglobin 14.1, hematocrit 41.0, platelets 147. SARS COVID-19 negative.  Chest radiograph with mild cardiomegaly, increased interstitial vascular infiltrates at bases bilaterally, congested hilar vasculature.  EKG 94 bpm, rightward axis, normal QTC, a sensed V paced, no significant ST segment or T wave changes.  Assessment & Plan:   Principal Problem:   Acute on chronic combined systolic and diastolic CHF (congestive heart failure) (HCC) Active Problems:   Essential hypertension   S/P CABG x 2   CKD (chronic kidney disease), stage III (HCC)   Persistent atrial fibrillation (HCC)   Thrombocytopenia (HCC)   COPD (chronic obstructive pulmonary disease) (HCC)   Obesity (BMI 30.0-34.9)   Elevated troponin   Acute on chronic systolic heart failure decompensation. Patient with improved symptoms but not yet back to baseline. Urine output 1,550 ml. Systolic blood pressure 147 mmHg. Patient  continue to have JVD and wheezing, positive lower extremity edema.   Continue diuresis with IV furosemide, heart failure management with losartan and metoprolol.  Continue telemetry monitoring.  2. HTN. Continue blood pressure control with losartan and metoprolol Diuresis with furosemide  3. COPD No clinical signs of exacerbation, will discontinue systemic steroids and continue bronchodilator therapy.   4. T2DM/ dyslipidemia Continue glucose cover and monitoring with insulin sliding scale.,   5. Atrial fibrillation sp AV ablation and pacer implantation. Patient with paced rhythm continue metoprolol and anticoagulation with rivaroxaban,   6. AKI on CKD stage 3a. Renal function with serum cr at 1,57 with K at 4,1 and bicarbonate at 19 Continue with diuresis for decongestion and follow renal panel in am.   Continue with statin therapy.   Patient continue to be at high risk for worsening heart failure   Status is: Inpatient  Remains inpatient appropriate because:IV treatments appropriate due to intensity of illness or inability to take PO  Dispo: The patient is from: Home              Anticipated d/c is to: Home              Patient currently is not medically stable to d/c.   Difficult to place patient No   DVT prophylaxis: Rivaroxaban   Code Status:   full  Family Communication:  No family at the bedside      Nutrition Status: Nutrition Problem: Increased nutrient needs Etiology: acute illness Signs/Symptoms: estimated needs Interventions: Ensure Enlive (each supplement provides 350kcal and 20 grams of protein), Magic cup, MVI     Consultants:  Cardiology    Subjective: Patient continue to have dyspnea, improved but not yet back to  baseline, no nausea or vomiting   Objective: Vitals:   02/11/21 0748 02/11/21 0907 02/11/21 0912 02/11/21 1147  BP: (!) 135/95  134/89 (!) 152/99  Pulse: 96  92 91  Resp: 18  19 16   Temp: (!) 97.5 F (36.4 C)  97.6 F (36.4 C) 97.8  F (36.6 C)  TempSrc: Oral  Oral   SpO2: (!) 88% 100% 98% 91%  Weight:      Height:        Intake/Output Summary (Last 24 hours) at 02/11/2021 1549 Last data filed at 02/11/2021 0958 Gross per 24 hour  Intake 530 ml  Output 500 ml  Net 30 ml   Filed Weights   02/10/21 0700 02/11/21 0301  Weight: 89.4 kg 88.7 kg    Examination:   General: Not in pain or dyspnea., deconditioned  Neurology: Awake and alert, non focal  E ENT: mild pallor, no icterus, oral mucosa moist Cardiovascular:  positive JVD. S1-S2 present, rhythmic, no gallops, rubs, or murmurs. No lower extremity edema. Pulmonary: positive breath sounds bilaterally, expiratory wheezing, scattered rhonchi and positive rales. Gastrointestinal. Abdomen soft and non tender Skin. No rashes Musculoskeletal: no joint deformities     Data Reviewed: I have personally reviewed following labs and imaging studies  CBC: Recent Labs  Lab 02/10/21 0417  WBC 7.8  HGB 14.1  HCT 45.0  MCV 85.9  PLT 654*   Basic Metabolic Panel: Recent Labs  Lab 02/10/21 0417 02/11/21 0203  NA 141 139  K 4.1 4.1  CL 113* 107  CO2 18* 19*  GLUCOSE 129* 211*  BUN 30* 34*  CREATININE 1.30* 1.57*  CALCIUM 9.6 9.5  MG  --  2.1   GFR: Estimated Creatinine Clearance: 35.2 mL/min (A) (by C-G formula based on SCr of 1.57 mg/dL (H)). Liver Function Tests: No results for input(s): AST, ALT, ALKPHOS, BILITOT, PROT, ALBUMIN in the last 168 hours. No results for input(s): LIPASE, AMYLASE in the last 168 hours. No results for input(s): AMMONIA in the last 168 hours. Coagulation Profile: No results for input(s): INR, PROTIME in the last 168 hours. Cardiac Enzymes: No results for input(s): CKTOTAL, CKMB, CKMBINDEX, TROPONINI in the last 168 hours. BNP (last 3 results) No results for input(s): PROBNP in the last 8760 hours. HbA1C: No results for input(s): HGBA1C in the last 72 hours. CBG: Recent Labs  Lab 02/10/21 2104 02/11/21 0518  02/11/21 1120  GLUCAP 132* 196* 310*   Lipid Profile: No results for input(s): CHOL, HDL, LDLCALC, TRIG, CHOLHDL, LDLDIRECT in the last 72 hours. Thyroid Function Tests: No results for input(s): TSH, T4TOTAL, FREET4, T3FREE, THYROIDAB in the last 72 hours. Anemia Panel: No results for input(s): VITAMINB12, FOLATE, FERRITIN, TIBC, IRON, RETICCTPCT in the last 72 hours.    Radiology Studies: I have reviewed all of the imaging during this hospital visit personally     Scheduled Meds:  allopurinol  300 mg Oral Daily   atorvastatin  10 mg Oral QHS   feeding supplement  237 mL Oral Q24H   furosemide  40 mg Intravenous BID   guaiFENesin  600 mg Oral BID   insulin aspart  0-5 Units Subcutaneous QHS   insulin aspart  0-9 Units Subcutaneous TID WC   ipratropium-albuterol  3 mL Nebulization BID   losartan  25 mg Oral Daily   metoprolol succinate  100 mg Oral BID   multivitamin with minerals  1 tablet Oral Daily   pantoprazole  40 mg Oral q AM   predniSONE  40 mg Oral Q breakfast   rivaroxaban  20 mg Oral Q supper   sodium chloride flush  3 mL Intravenous Q12H   Continuous Infusions:  sodium chloride       LOS: 1 day        Priya Matsen Gerome Apley, MD

## 2021-02-11 NOTE — Progress Notes (Signed)
  Echocardiogram 2D Echocardiogram has been performed.  Randa Lynn Judythe Postema 02/11/2021, 3:39 PM

## 2021-02-11 NOTE — Evaluation (Signed)
Physical Therapy Evaluation Patient Details Name: [name] [name] MRN: [id number] DOB: [birth year] Today's Date: 02/11/2021   History of Present Illness  75 y.o. female presents to [facility] ED on 6/29 with complaints of shortness of breath. Pt recently undergone AV ablation with pacemaker 3 weeks ago. CXR significant for acute on chronic interstitial markings concerning for pulmonary edema. PMH includes permanent A. Fib s/p PPM and AV node ablation, HTN, HLD, CHF with EF 25-30%, CAD s/p CABG in 2016, cardiac arrest, diabetes mellitus type 2, CKD stage III, COPD, GERD, and gout.  Clinical Impression  Pt presents to PT with mild deficits in gait, balance, and endurance. Pt does also demonstrate some possible impairments in cognition, asking to have oxygen tubing filled with air to take home after PT explains that the patient will likely not need oxygen at discharge after saturating well on room air. Pt will benefit from dynamic gait challenges and energy conservation education next session to aide in improving independence in community mobility. PT recommends no PT or DME needs at the time of discharge.    Follow Up Recommendations No PT follow up    Equipment Recommendations  None recommended by PT    Recommendations for Other Services       Precautions / Restrictions Precautions Precautions: Fall;ICD/Pacemaker Precaution Comments: old pacemaker Restrictions Weight Bearing Restrictions: No      Mobility  Bed Mobility               General bed mobility comments: not assessed, pt received and left sitting at edge of bed    Transfers Overall transfer level: Independent Equipment used: None Transfers: Sit to/from Stand Sit to Stand: Independent            Ambulation/Gait Ambulation/Gait assistance: Supervision Gait Distance (Feet): 200 Feet Assistive device: None Gait Pattern/deviations: Step-through pattern Gait velocity: functional Gait velocity interpretation: 1.31 - 2.62  ft/sec, indicative of limited community ambulator General Gait Details: pt with steady step-through gait  Stairs            Wheelchair Mobility    Modified Rankin (Stroke Patients Only)       Balance Overall balance assessment: Mild deficits observed, not formally tested                                           Pertinent Vitals/Pain Pain Assessment: No/denies pain    Home Living Family/patient expects to be discharged to:: Private residence Living Arrangements: Alone Available Help at Discharge: Family;Friend(s);Available PRN/intermittently Type of Home: House Home Access: Stairs to enter Entrance Stairs-Rails: Can reach both Entrance Stairs-Number of Steps: 3 Home Layout: One level Home Equipment: Cane - single point      Prior Function Level of Independence: Independent with assistive device(s)         Comments: Pt reports that she sometimes uses SPC when walking up stairs "It depends what's hurting me"     Hand Dominance   Dominant Hand: Right    Extremity/Trunk Assessment   Upper Extremity Assessment Upper Extremity Assessment: Overall WFL for tasks assessed    Lower Extremity Assessment Lower Extremity Assessment: Overall WFL for tasks assessed       Communication   Communication: No difficulties  Cognition Arousal/Alertness: Awake/alert Behavior During Therapy: WFL for tasks assessed/performed Overall Cognitive Status: Within Functional Limits for tasks assessed  General Comments: WFL for purposes of evaluation      General Comments General comments (skin integrity, edema, etc.): VSS on RA, pt with sats in high 90s on room air when ambulating    Exercises     Assessment/Plan    PT Assessment Patient needs continued PT services  PT Problem List Decreased activity tolerance;Cardiopulmonary status limiting activity       PT Treatment Interventions Gait  training;Therapeutic activities;Stair training;Patient/family education;Therapeutic exercise    PT Goals (Current goals can be found in the Care Plan section)  Acute Rehab PT Goals Patient Stated Goal: to go home PT Goal Formulation: With patient Time For Goal Achievement: 02/25/21 Potential to Achieve Goals: Good Additional Goals Additional Goal #1: Pt will report 3/10 DOE or less when ambulating for community distances to demonstrate improved activity tolerance    Frequency Min 3X/week   Barriers to discharge        Co-evaluation               AM-PAC PT "6 Clicks" Mobility  Outcome Measure Help needed turning from your back to your side while in a flat bed without using bedrails?: None Help needed moving from lying on your back to sitting on the side of a flat bed without using bedrails?: None Help needed moving to and from a bed to a chair (including a wheelchair)?: None Help needed standing up from a chair using your arms (e.g., wheelchair or bedside chair)?: None Help needed to walk in hospital room?: A Little Help needed climbing 3-5 steps with a railing? : A Little 6 Click Score: 22    End of Session   Activity Tolerance: Patient tolerated treatment well Patient left: in bed;with call bell/phone within reach Nurse Communication: Mobility status PT Visit Diagnosis: Other abnormalities of gait and mobility (R26.89)    Time: 1345-1400 PT Time Calculation (min) (ACUTE ONLY): 15 min   Charges:   PT Evaluation $PT Eval Low Complexity: [place], PT, DPT Acute Rehabilitation Pager: [phone]   Zenaida Niece 02/11/2021, 2:39 PM

## 2021-02-12 ENCOUNTER — Encounter: Payer: Self-pay | Admitting: Cardiology

## 2021-02-12 DIAGNOSIS — R778 Other specified abnormalities of plasma proteins: Secondary | ICD-10-CM

## 2021-02-12 DIAGNOSIS — I4821 Permanent atrial fibrillation: Secondary | ICD-10-CM

## 2021-02-12 LAB — GLUCOSE, CAPILLARY
Glucose-Capillary: 154 mg/dL — ABNORMAL HIGH (ref 70–99)
Glucose-Capillary: 150 mg/dL — ABNORMAL HIGH (ref 70–99)

## 2021-02-12 LAB — BASIC METABOLIC PANEL
Anion gap: 11 (ref 5–15)
BUN: 44 mg/dL — ABNORMAL HIGH (ref 8–23)
CO2: 19 mmol/L — ABNORMAL LOW (ref 22–32)
Calcium: 9.4 mg/dL (ref 8.9–10.3)
Chloride: 108 mmol/L (ref 98–111)
Creatinine, Ser: 1.51 mg/dL — ABNORMAL HIGH (ref 0.44–1.00)
GFR, Estimated: 36 mL/min — ABNORMAL LOW (ref >60)
Glucose, Bld: 155 mg/dL — ABNORMAL HIGH (ref 70–99)
Potassium: 4.3 mmol/L (ref 3.5–5.1)
Sodium: 138 mmol/L (ref 135–145)

## 2021-02-12 MED ORDER — TORSEMIDE 20 MG PO TABS: 40.0000 mg | ORAL_TABLET | Freq: Every day | ORAL | Status: DC

## 2021-02-12 MED ORDER — MELATONIN 5 MG PO TABS: 5.0000 mg | ORAL_TABLET | Freq: Every evening | ORAL | Status: DC | PRN

## 2021-02-12 MED ORDER — TORSEMIDE 20 MG PO TABS: 40.0000 mg | ORAL_TABLET | Freq: Every day | ORAL | 0 refills | Status: DC

## 2021-02-12 NOTE — Discharge Summary (Addendum)
Physician Discharge Summary  [name] [name] LFY:[id number] DOB: 1945/11/24 DOA: 02/10/2021  PCP: [name], MD  Admit date: 02/10/2021 Discharge date: 02/12/2021  Admitted From: Home  Disposition:  Home   Recommendations for Outpatient Follow-up and new medication changes:  Follow up with Dr Lysle Rubens in 7 to 10 days.  Continue diuresis with torsemide 40 mg daily Salt and restricted diet.  Follow up renal function in 7 days.   Home Health: no   Equipment/Devices: no    Discharge Condition: stable  CODE STATUS: full  Diet recommendation: hearth healthy and diabetic prudent   Brief/Interim Summary: [name] [name] was admitted to the hospital with the working diagnosis of systolic heart failure decompensation.   75 yo female with the past medical history of atrial fibrillation, htn, systolic heart failure, E5ID, COPD, GERD,CKD stage 2. She reported one to two weeks of dyspnea, along with orthopnea.  Positive cough and productive sputum.  Decreased physical functional capacity and right-sided chest pain. 3 weeks prior to this hospitalization had AV ablation and pacemaker implantation. On her initial physical examination blood pressure 108/48, heart rate 90, respiratory rate 34, afebrile, oxygen saturation 97%, lungs with expiratory wheezing, heart S1-S2, irregularly irregular, abdomen soft nontender, trace bilateral lower extremity edema.   Sodium 141, potassium 4.1, chloride 113, bicarb 18, glucose 129, BUN 30, creatinine 1.30, BNP 1207, high sensitive troponin 29-30, white count 7.8, hemoglobin 14.1, hematocrit 41.0, platelets 147. SARS COVID-19 negative.   Chest radiograph with mild cardiomegaly, increased interstitial vascular infiltrates at bases bilaterally, congested hilar vasculature.   EKG 94 bpm, rightward axis, normal QTC, a sensed V paced, no significant ST segment or T wave changes.  Patient was placed on IV furosemide with improvement in her volume status along with physical  functional capacity.     Acute on chronic systolic heart failure decompensation.  Patient was admitted to the cardiac ward, she received intravenous furosemide, negative fluid balance was achieved with significant improvement of her symptoms.  She has been advised about salt and fluid restriction.  At discharge will continue torsemide 40 mg daily. Heart failure management with losartan and metoprolol.  2.  Hypertension.  Continue blood pressure control with losartan and metoprolol.  3.  Atrial fibrillation status post AV ablation and pacer implantation.  Patient remained paced rhythm, continue rate control metoprolol, anticoagulation with rivaroxaban.  4.  Acute kidney injury on chronic kidney disease stage IIIa.  Her kidney function remained stable, tolerated well diuresis.  At her discharge sodium 138, potassium 4.3, chloride 108, bicarb 19, glucose 155, BUN 44, creatinine 1.51.  5.  COPD.  No clinical signs of exacerbation, continue bronchodilator therapy at home.  6.  Type 2 diabetes mellitus, dyslipidemia.  Patient was placed on insulin sliding scale for glucose coverage and monitoring.  At discharge she will resume her oral antihyperglycemic agent, metformin.  7. Obesity class 1. Calculated BMI is 30.06  Discharge Diagnoses:  Principal Problem:   Acute on chronic combined systolic and diastolic CHF (congestive heart failure) (HCC) Active Problems:   Essential hypertension   S/P CABG x 2   CKD (chronic kidney disease), stage III (HCC)   Persistent atrial fibrillation (HCC)   Thrombocytopenia (HCC)   COPD (chronic obstructive pulmonary disease) (HCC)   Obesity (BMI 30.0-34.9)   Elevated troponin    Discharge Instructions   Allergies as of 02/12/2021       Reactions   Bee Venom Anaphylaxis   Ivp Dye [iodinated Diagnostic Agents] Anaphylaxis   Ace Inhibitors  Other reaction(s): angioedema   Atenolol    Other reaction(s): severe headaches   Codeine Nausea And Vomiting    Entresto [sacubitril-valsartan] Other (See Comments)   Chest pain    Penicillin G    Other reaction(s): rash   Shrimp [shellfish Allergy] Swelling   Simvastatin    Other reaction(s): not sure   Jardiance [empagliflozin] Other (See Comments)   Caused Boils        Medication List     TAKE these medications    acetaminophen 325 MG tablet Commonly known as: TYLENOL Take 2 tablets (650 mg total) by mouth every 4 (four) hours as needed for headache or mild pain.   allopurinol 300 MG tablet Commonly known as: ZYLOPRIM Take 1 tablet (300 mg total) by mouth daily.   atorvastatin 10 MG tablet Commonly known as: LIPITOR Take 10 mg by mouth at bedtime.   losartan 25 MG tablet Commonly known as: COZAAR Take 1 tablet (25 mg total) by mouth daily.   metFORMIN 500 MG tablet Commonly known as: GLUCOPHAGE Take 500 mg by mouth daily with breakfast. Daily with a meal   metoprolol succinate 100 MG 24 hr tablet Commonly known as: TOPROL-XL Take 1 tablet (100 mg total) by mouth 2 (two) times daily. Take with or immediately following a meal.   pantoprazole 40 MG tablet Commonly known as: PROTONIX Take 40 mg by mouth in the morning.   rivaroxaban 20 MG Tabs tablet Commonly known as: XARELTO Take 1 tablet (20 mg total) by mouth daily with supper. Resume from 11/01/2019   Spiriva Respimat 2.5 MCG/ACT Aers Generic drug: Tiotropium Bromide Monohydrate Inhale 2 puffs into the lungs daily. What changed:  when to take this reasons to take this   torsemide 20 MG tablet Commonly known as: DEMADEX Take 2 tablets (40 mg total) by mouth daily.        Allergies  Allergen Reactions   Bee Venom Anaphylaxis   Ivp Dye [Iodinated Diagnostic Agents] Anaphylaxis   Ace Inhibitors     Other reaction(s): angioedema   Atenolol     Other reaction(s): severe headaches   Codeine Nausea And Vomiting   Entresto [Sacubitril-Valsartan] Other (See Comments)    Chest pain    Penicillin G      Other reaction(s): rash   Shrimp [Shellfish Allergy] Swelling   Simvastatin     Other reaction(s): not sure   Jardiance [Empagliflozin] Other (See Comments)    Caused Boils     Consultations: Cardiology    Procedures/Studies: DG Chest 2 View  Result Date: 02/10/2021 CLINICAL DATA:  75 year old female with shortness of breath. Atrial fibrillation. Pacemaker placed 3 weeks ago. EXAM: CHEST - 2 VIEW COMPARISON:  Portable chest 01/18/2021 and earlier. FINDINGS: PA and lateral views today. Stable cardiomegaly and mediastinal contours. Stable sequelae of CABG. Stable left chest single lead transvenous pacemaker. Visualized tracheal air column is within normal limits. No pneumothorax or pleural effusion. Chronic but increased interstitial markings throughout both lungs. Chronic linear atelectasis or scarring at the lingula is stable since last year. Exaggerated thoracic kyphosis. No acute osseous abnormality identified. Negative visible bowel gas pattern. IMPRESSION: 1. Acute on chronic interstitial markings in both lungs suspicious for interstitial edema in the setting of chronic cardiomegaly, atrial fibrillation. 2. Otherwise stable chest. Electronically Signed   By: Genevie Ann M.D.   On: 02/10/2021 04:47   EP STUDY  Result Date: 01/18/2021 CONCLUSIONS:  1. Successful implantation of a Biotronik dual-chamber pacemaker followed by AV node ablation  for uncontrolled atrial fib.  2. No early apparent complications.       [name], MD 01/18/2021 2:49 PM   EP PPM/ICD IMPLANT  Result Date: 01/18/2021 CONCLUSIONS:  1. Successful implantation of a Biotronik dual-chamber pacemaker followed by AV node ablation for uncontrolled atrial fib.  2. No early apparent complications.       [name], MD 01/18/2021 2:49 PM   DG Chest Port 1 View  Result Date: 01/18/2021 CLINICAL DATA:  Pacemaker placement after AV node ablation today EXAM: PORTABLE CHEST 1 VIEW COMPARISON:  12/08/2020 chest radiograph. FINDINGS:  Intact sternotomy wires. Single lead left subclavian pacemaker with lead tip overlying right ventricle. Stable cardiomediastinal silhouette with marked cardiomegaly. No pneumothorax. No pleural effusion. No overt pulmonary edema. Stable thin scar in the left mid lung. No acute consolidative airspace disease. IMPRESSION: 1. No pneumothorax status post pacemaker placement. 2. Stable marked cardiomegaly without overt pulmonary edema. Electronically Signed   By: Ilona Sorrel M.D.   On: 01/18/2021 19:45   CUP PACEART INCLINIC DEVICE CHECK  Result Date: 01/28/2021 Wound check appointment. Steri-strips removed. Wound without redness or edema. Incision edges approximated, wound well healed. Normal device function. Thresholds, sensing, and impedances consistent with implant measurements. Device programmed at 3.0V/auto capture programmed on for extra safety margin until 3 month visit. Histogram distribution appropriate for patient and level of activity. No high ventricular rates noted. Patient educated about wound care, arm mobility, lifting restrictions. ROV  in 02/16/21 to see A. Tillery, PA for follow up, also 3 months with implanting physician.Lavenia Atlas, BSN, RN Wound check appointment. Steri-strips removed. Wound without redness or edema. Incision edges approximated, wound well healed. Normal device function. Thresholds, sensing, and impedances consistent with implant measurements. Device programmed at 3.0V/auto capture programmed on for extra safety margin until 3 month visit. Histogram distribution appropriate for patient and level of activity. No high ventricular rates noted. Patient educated about wound care, arm mobility, lifting restrictions. ROV  in 02/16/21 to see A. Tillery, PA for follow up, also 3 months with implanting physician. See phone note in EPIC about remote monitor 01/28/21. Lucy Chris, BSN, RN  ECHOCARDIOGRAM LIMITED  Result Date: 02/11/2021    ECHOCARDIOGRAM LIMITED REPORT   Patient  Name:   [name] [name] Date of Exam: 02/11/2021 Medical Rec #:  [id number]     Height:       67.5 in Accession #:    [id number]    Weight:       195.5 lb Date of Birth:  1946-08-05     BSA:          2.014 m Patient Age:    37 years      BP:           135/95 mmHg Patient Gender: F             HR:           90 bpm. Exam Location:  Inpatient Procedure: Limited Echo, Cardiac Doppler and Limited Color Doppler Indications:    I50.23 Acute on chronic systolic (congestive) heart failure  History:        Patient has prior history of Echocardiogram examinations, most                 recent 12/08/2020. Cardiomyopathy, CAD, Prior CABG and Pacemaker,                 COPD and Carotid Disease, Arrythmias:Atrial Fibrillation; Risk  Factors:Hypertension. GERD. CKD.  Sonographer:    Jonelle Sidle Dance Referring Phys: [id number] [place]  1. Left ventricular ejection fraction, by estimation, is 25 to 30%. The left ventricle has severely decreased function. The left ventricle demonstrates global hypokinesis. There is mild concentric left ventricular hypertrophy. Left ventricular diastolic  function could not be evaluated.  2. Right ventricular systolic function is severely reduced. The right ventricular size is moderately enlarged. There is moderately elevated pulmonary artery systolic pressure. The estimated right ventricular systolic pressure is 31.5 mmHg.  3. The mitral valve is grossly normal. Severe mitral valve regurgitation. No evidence of mitral stenosis.  4. The tricuspid valve is abnormal. Tricuspid valve regurgitation is severe.  5. Left atrial size was severely dilated.  6. Right atrial size was moderately dilated.  7. The aortic valve is tricuspid. There is moderate calcification of the aortic valve. There is moderate thickening of the aortic valve. Aortic valve regurgitation is mild. Mild to moderate aortic valve sclerosis/calcification is present, without any evidence of aortic stenosis.  8.  The inferior vena cava is dilated in size with <50% respiratory variability, suggesting right atrial pressure of 15 mmHg. Comparison(s): Changes from prior study are noted. EF remains unchanged. MR and TR are now severe. RVSP ~53 mmHG. FINDINGS  Left Ventricle: Left ventricular ejection fraction, by estimation, is 25 to 30%. The left ventricle has severely decreased function. The left ventricle demonstrates global hypokinesis. The left ventricular internal cavity size was normal in size. There is mild concentric left ventricular hypertrophy. Left ventricular diastolic function could not be evaluated. Left ventricular diastolic function could not be evaluated due to atrial fibrillation. Right Ventricle: The right ventricular size is moderately enlarged. No increase in right ventricular wall thickness. Right ventricular systolic function is severely reduced. There is moderately elevated pulmonary artery systolic pressure. The tricuspid regurgitant velocity is 3.09 m/s, and with an assumed right atrial pressure of 15 mmHg, the estimated right ventricular systolic pressure is 17.6 mmHg. Left Atrium: Left atrial size was severely dilated. Right Atrium: Right atrial size was moderately dilated. Pericardium: Trivial pericardial effusion is present. Mitral Valve: The mitral valve is grossly normal. Mild mitral annular calcification. Severe mitral valve regurgitation. No evidence of mitral valve stenosis. Tricuspid Valve: The tricuspid valve is abnormal. Tricuspid valve regurgitation is severe. No evidence of tricuspid stenosis. The flow in the hepatic veins is reversed during ventricular systole. Aortic Valve: The aortic valve is tricuspid. There is moderate calcification of the aortic valve. There is moderate thickening of the aortic valve. Aortic valve regurgitation is mild. Aortic regurgitation PHT measures 264 msec. Mild to moderate aortic valve sclerosis/calcification is present, without any evidence of aortic  stenosis. Pulmonic Valve: The pulmonic valve was grossly normal. Pulmonic valve regurgitation is not visualized. No evidence of pulmonic stenosis. Aorta: The aortic root and ascending aorta are structurally normal, with no evidence of dilitation. Venous: The inferior vena cava is dilated in size with less than 50% respiratory variability, suggesting right atrial pressure of 15 mmHg. Additional Comments: A device lead is visualized in the right atrium and right ventricle. LEFT VENTRICLE PLAX 2D LVIDd:         5.20 cm LVIDs:         3.50 cm LV PW:         1.20 cm LV IVS:        1.40 cm LVOT diam:     1.90 cm LV SV:         31 LV  SV Index:   15 LVOT Area:     2.84 cm  RIGHT VENTRICLE          IVC RV Basal diam:  4.10 cm  IVC diam: 3.20 cm RV Mid diam:    2.90 cm LEFT ATRIUM         Index      RIGHT ATRIUM           Index LA diam:    5.90 cm 2.93 cm/m RA Area:     26.70 cm                                RA Volume:   93.30 ml  46.33 ml/m  AORTIC VALVE LVOT Vmax:   78.70 cm/s LVOT Vmean:  50.150 cm/s LVOT VTI:    0.110 m AI PHT:      264 msec  AORTA Ao Root diam: 3.00 cm Ao Asc diam:  2.80 cm MITRAL VALVE               TRICUSPID VALVE MV Area (PHT): 3.48 cm    TR Peak grad:   38.2 mmHg MV Decel Time: 218 msec    TR Vmax:        309.00 cm/s MR Peak grad: 109.0 mmHg MR Mean grad: 66.0 mmHg    SHUNTS MR Vmax:      522.00 cm/s  Systemic VTI:  0.11 m MR Vmean:     384.0 cm/s   Systemic Diam: 1.90 cm MV E velocity: 97.30 cm/s MV A velocity: 27.00 cm/s MV E/A ratio:  3.60 Eleonore Chiquito MD Electronically signed by Eleonore Chiquito MD Signature Date/Time: 02/11/2021/6:00:46 PM    Final        Subjective: Patient is feeling better, dyspnea has resolved, no chest pain, no lower extremity edema.   Discharge Exam: Vitals:   02/11/21 2024 02/12/21 0226  BP: 116/79 125/89  Pulse: 92 90  Resp: 16 18  Temp: 97.8 F (36.6 C) (!) 97.5 F (36.4 C)  SpO2: 100% 100%   Vitals:   02/11/21 1147 02/11/21 2024 02/12/21 0218  02/12/21 0226  BP: (!) 152/99 116/79  125/89  Pulse: 91 92  90  Resp: 16 16  18   Temp: 97.8 F (36.6 C) 97.8 F (36.6 C)  (!) 97.5 F (36.4 C)  TempSrc:  Oral  Oral  SpO2: 91% 100%  100%  Weight:   88.4 kg   Height:        General: Not in pain or dyspnea  Neurology: Awake and alert, non focal  E ENT: no pallor, no icterus, oral mucosa moist Cardiovascular: No JVD. S1-S2 present, rhythmic, no gallops, rubs, or murmurs. No lower extremity edema. Pulmonary: positive breath sounds bilaterally, with no wheezing, rhonchi or rales. Gastrointestinal. Abdomen soft and non tender Skin. No rashes Musculoskeletal: no joint deformities   The results of significant diagnostics from this hospitalization (including imaging, microbiology, ancillary and laboratory) are listed below for reference.     Microbiology: Recent Results (from the past 240 hour(s))  Resp Panel by RT-PCR (Flu A&B, Covid) Nasopharyngeal Swab     Status: None   Collection Time: 02/10/21  5:38 AM   Specimen: Nasopharyngeal Swab; Nasopharyngeal(NP) swabs in vial transport medium  Result Value Ref Range Status   SARS Coronavirus 2 by RT PCR NEGATIVE NEGATIVE Final    Comment: (NOTE) SARS-CoV-2 target nucleic acids are NOT DETECTED.  The SARS-CoV-2  RNA is generally detectable in upper respiratory specimens during the acute phase of infection. The lowest concentration of SARS-CoV-2 viral copies this assay can detect is 138 copies/mL. A negative result does not preclude SARS-Cov-2 infection and should not be used as the sole basis for treatment or other patient management decisions. A negative result may occur with  improper specimen collection/handling, submission of specimen other than nasopharyngeal swab, presence of viral mutation(s) within the areas targeted by this assay, and inadequate number of viral copies(<138 copies/mL). A negative result must be combined with clinical observations, patient history, and  epidemiological information. The expected result is Negative.  Fact Sheet for Patients:  EntrepreneurPulse.com.au  Fact Sheet for Healthcare Providers:  IncredibleEmployment.be  This test is no t yet approved or cleared by the [place] FDA and  has been authorized for detection and/or diagnosis of SARS-CoV-2 by FDA under an Emergency Use Authorization (EUA). This EUA will remain  in effect (meaning this test can be used) for the duration of the COVID-19 declaration under Section 564(b)(1) of the Act, 21 U.S.C.section 360bbb-3(b)(1), unless the authorization is terminated  or revoked sooner.       Influenza A by PCR NEGATIVE NEGATIVE Final   Influenza B by PCR NEGATIVE NEGATIVE Final    Comment: (NOTE) The Xpert Xpress SARS-CoV-2/FLU/RSV plus assay is intended as an aid in the diagnosis of influenza from Nasopharyngeal swab specimens and should not be used as a sole basis for treatment. Nasal washings and aspirates are unacceptable for Xpert Xpress SARS-CoV-2/FLU/RSV testing.  Fact Sheet for Patients: EntrepreneurPulse.com.au  Fact Sheet for Healthcare Providers: IncredibleEmployment.be  This test is not yet approved or cleared by the [place] FDA and has been authorized for detection and/or diagnosis of SARS-CoV-2 by FDA under an Emergency Use Authorization (EUA). This EUA will remain in effect (meaning this test can be used) for the duration of the COVID-19 declaration under Section 564(b)(1) of the Act, 21 U.S.C. section 360bbb-3(b)(1), unless the authorization is terminated or revoked.  Performed at [facility], [place] [address] [place], [place] [address]      Labs: BNP (last 3 results) Recent Labs    11/15/20 0951 12/08/20 0830 02/10/21 0510  BNP 658.7* 1,065.4* 3,267.1*   Basic Metabolic Panel: Recent Labs  Lab 02/10/21 0417 02/11/21 0203 02/12/21 0330  NA 141 139  138  K 4.1 4.1 4.3  CL 113* 107 108  CO2 18* 19* 19*  GLUCOSE 129* 211* 155*  BUN 30* 34* 44*  CREATININE 1.30* 1.57* 1.51*  CALCIUM 9.6 9.5 9.4  MG  --  2.1  --    Liver Function Tests: No results for input(s): AST, ALT, ALKPHOS, BILITOT, PROT, ALBUMIN in the last 168 hours. No results for input(s): LIPASE, AMYLASE in the last 168 hours. No results for input(s): AMMONIA in the last 168 hours. CBC: Recent Labs  Lab 02/10/21 0417  WBC 7.8  HGB 14.1  HCT 45.0  MCV 85.9  PLT 147*   Cardiac Enzymes: No results for input(s): CKTOTAL, CKMB, CKMBINDEX, TROPONINI in the last 168 hours. BNP: Invalid input(s): POCBNP CBG: Recent Labs  Lab 02/11/21 1120 02/11/21 1609 02/11/21 2104 02/12/21 0227 02/12/21 0637  GLUCAP 310* 251* 159* 154* 150*   D-Dimer No results for input(s): DDIMER in the last 72 hours. Hgb A1c Recent Labs    02/11/21 0203  HGBA1C 6.8*   Lipid Profile No results for input(s): CHOL, HDL, LDLCALC, TRIG, CHOLHDL, LDLDIRECT in the last 72 hours. Thyroid  function studies No results for input(s): TSH, T4TOTAL, T3FREE, THYROIDAB in the last 72 hours.  Invalid input(s): FREET3 Anemia work up No results for input(s): VITAMINB12, FOLATE, FERRITIN, TIBC, IRON, RETICCTPCT in the last 72 hours. Urinalysis    Component Value Date/Time   COLORURINE YELLOW 12/08/2020 0938   APPEARANCEUR CLEAR 12/08/2020 0938   LABSPEC 1.017 12/08/2020 0938   PHURINE 5.0 12/08/2020 0938   GLUCOSEU NEGATIVE 12/08/2020 0938   HGBUR NEGATIVE 12/08/2020 0938   BILIRUBINUR NEGATIVE 12/08/2020 0938   BILIRUBINUR ng 12/25/2013 1342   KETONESUR NEGATIVE 12/08/2020 0938   PROTEINUR 30 (A) 12/08/2020 0938   UROBILINOGEN 0.2 04/11/2017 1402   NITRITE NEGATIVE 12/08/2020 0938   LEUKOCYTESUR NEGATIVE 12/08/2020 0938   Sepsis Labs Invalid input(s): PROCALCITONIN,  WBC,  LACTICIDVEN Microbiology Recent Results (from the past 240 hour(s))  Resp Panel by RT-PCR (Flu A&B, Covid)  Nasopharyngeal Swab     Status: None   Collection Time: 02/10/21  5:38 AM   Specimen: Nasopharyngeal Swab; Nasopharyngeal(NP) swabs in vial transport medium  Result Value Ref Range Status   SARS Coronavirus 2 by RT PCR NEGATIVE NEGATIVE Final    Comment: (NOTE) SARS-CoV-2 target nucleic acids are NOT DETECTED.  The SARS-CoV-2 RNA is generally detectable in upper respiratory specimens during the acute phase of infection. The lowest concentration of SARS-CoV-2 viral copies this assay can detect is 138 copies/mL. A negative result does not preclude SARS-Cov-2 infection and should not be used as the sole basis for treatment or other patient management decisions. A negative result may occur with  improper specimen collection/handling, submission of specimen other than nasopharyngeal swab, presence of viral mutation(s) within the areas targeted by this assay, and inadequate number of viral copies(<138 copies/mL). A negative result must be combined with clinical observations, patient history, and epidemiological information. The expected result is Negative.  Fact Sheet for Patients:  EntrepreneurPulse.com.au  Fact Sheet for Healthcare Providers:  IncredibleEmployment.be  This test is no t yet approved or cleared by the [place] FDA and  has been authorized for detection and/or diagnosis of SARS-CoV-2 by FDA under an Emergency Use Authorization (EUA). This EUA will remain  in effect (meaning this test can be used) for the duration of the COVID-19 declaration under Section 564(b)(1) of the Act, 21 U.S.C.section 360bbb-3(b)(1), unless the authorization is terminated  or revoked sooner.       Influenza A by PCR NEGATIVE NEGATIVE Final   Influenza B by PCR NEGATIVE NEGATIVE Final    Comment: (NOTE) The Xpert Xpress SARS-CoV-2/FLU/RSV plus assay is intended as an aid in the diagnosis of influenza from Nasopharyngeal swab specimens and should not be  used as a sole basis for treatment. Nasal washings and aspirates are unacceptable for Xpert Xpress SARS-CoV-2/FLU/RSV testing.  Fact Sheet for Patients: EntrepreneurPulse.com.au  Fact Sheet for Healthcare Providers: IncredibleEmployment.be  This test is not yet approved or cleared by the [place] FDA and has been authorized for detection and/or diagnosis of SARS-CoV-2 by FDA under an Emergency Use Authorization (EUA). This EUA will remain in effect (meaning this test can be used) for the duration of the COVID-19 declaration under Section 564(b)(1) of the Act, 21 U.S.C. section 360bbb-3(b)(1), unless the authorization is terminated or revoked.  Performed at [facility], [place] [address] [place], [place] [address]      Time coordinating discharge: 45 minutes  SIGNED:   Tawni Millers, MD  [facility] 02/12/2021, 10:28 AM

## 2021-02-12 NOTE — Care Management Important Message (Signed)
Important Message  Patient Details  Name: [name] [name] MRN: [id number] Date of Birth: [birth year]   Medicare Important Message Given:  Yes   Patient left prior to IM delivery.  Will mailed the document to the patients home address.    Paisely Brick 02/12/2021, 2:40 PM

## 2021-02-12 NOTE — Progress Notes (Signed)
Pt woke up, removed tele box and began to talk about leaving but just states that she is getting ready for when the doctors put in the discharge orders in the morning. Pt is alert and oriented but behavior is erratic and irritable and wants to be left alone. Agreed to leave tele monitor on. CBG and vitals checked. MD notified.

## 2021-02-12 NOTE — Progress Notes (Signed)
Discharge instructions given also educated patient on heart failure. Patient verbalized understanding. All questions were answered.

## 2021-02-12 NOTE — Progress Notes (Signed)
Pt is very irritable and does not want to be asked questions. Found to be using white plastic device to inhale through her nose. When asked about it pt became agitated and said it was none of our business. Pt also needing to urinate frequently with little output. MD notified of changes.

## 2021-02-12 NOTE — Therapy (Signed)
Occupational Therapy Treatment Patient Details Name: [name] [name] MRN: [id number] DOB: [birth year] Today's Date: 02/12/2021    History of present illness 75 y.o. female presents to [facility] ED on 6/29 with complaints of shortness of breath. Pt recently undergone AV ablation with pacemaker 3 weeks ago. CXR significant for acute on chronic interstitial markings concerning for pulmonary edema. PMH includes permanent A. Fib s/p PPM and AV node ablation, HTN, HLD, CHF with EF 25-30%, CAD s/p CABG in 2016, cardiac arrest, diabetes mellitus type 2, CKD stage III, COPD, GERD, and gout.   OT comments  Pt was seen for ADL retraining session today with focus on pt education re: energy conservation techniques, functional mobility and transfers (toilet and tub/shower). Pt is overall Mod I for toileting and transfers while amb in room. Rec supervision for tub/shower transfers. She was instructed in energy conservation techniques and a handout was issued and reviewed. She verbalized understanding. Progressing toward plan of care and goals. Pt plans to d/c home and reports intermittent supervision from family friend in evenings per her report.   Follow Up Recommendations  No OT follow up;Supervision - Intermittent    Equipment Recommendations  None recommended by OT    Recommendations for Other Services      Precautions / Restrictions Precautions Precautions: Fall;ICD/Pacemaker Precaution Comments: old pacemaker Restrictions Weight Bearing Restrictions: No       Mobility Bed Mobility Overal bed mobility: Independent                  Transfers Overall transfer level: Independent Equipment used: None Transfers: Sit to/from [employer] to Stand: Independent Stand pivot transfers: Independent            Balance                                           ADL either performed or assessed with clinical judgement   ADL Overall ADL's : Needs  assistance/impaired     Grooming: Wash/dry hands;Modified independent;Standing (Standing at sink level)   Upper Body Bathing: Modified independent;Sitting;Set up       Upper Body Dressing : Modified independent;Sitting       Toilet Transfer: Modified Independent;Regular Toilet;Ambulation;Grab bars   Toileting- Clothing Manipulation and Hygiene: Modified independent;Sitting/lateral lean;Sit to/from stand   Tub/ Banker: Supervision/safety;Ambulation (Simulated tub transfer in room. Discussed possibility of DME and pt declines "I don't need that, I need to get out of here")     General ADL Comments: Pt was also issued a handout for energy conservation techniques. This handout was issued and reviewed written and verbally w/ pt in her room and examples, demonstration was provided.     Vision       Perception     Praxis      Cognition Arousal/Alertness: Awake/alert Behavior During Therapy: WFL for tasks assessed/performed (Pt somewhat agitated about bed bugs in her room and being on contact precautions, wanting to go home. RN notified) Overall Cognitive Status: Within Functional Limits for tasks assessed                                          Exercises     Shoulder Instructions       General Comments      Pertinent Vitals/  Pain       Pain Assessment: No/denies pain  Home Living                                          Prior Functioning/Environment              Frequency  Min 2X/week        Progress Toward Goals  OT Goals(current goals can now be found in the care plan section)  Progress towards OT goals: Progressing toward goals  Acute Rehab OT Goals Patient Stated Goal:  (Go home) Potential to Achieve Goals: Good  Plan Discharge plan remains appropriate    Co-evaluation                 AM-PAC OT "6 Clicks" Daily Activity     Outcome Measure   Help from another person eating meals?:  None Help from another person taking care of personal grooming?: None Help from another person toileting, which includes using toliet, bedpan, or urinal?: None Help from another person bathing (including washing, rinsing, drying)?: A Little Help from another person to put on and taking off regular upper body clothing?: None Help from another person to put on and taking off regular lower body clothing?: None 6 Click Score: 23    End of Session Equipment Utilized During Treatment:  (None during ambulation; grab bar in bathroom)  OT Visit Diagnosis: Muscle weakness (generalized) (M62.81);History of falling (Z91.81);Other (comment)   Activity Tolerance Patient tolerated treatment well   Patient Left in bed;with call bell/phone within reach   Nurse Communication Mobility status;Other (comment) (Pt upset about bed bugs and being on contact precautions, she believes that someone gave them to her b/c "I saw them in my bed, I don't know how they got there")        Time: 0941-1001 OT Time Calculation (min): 20 min  Charges: OT General Charges $OT Visit: 1 Visit OT Treatments $Self Care/Home Management : 8-22 mins   Helia Haese Beth Dixon, OTR/L 02/12/2021, 11:26 AM

## 2021-02-12 NOTE — Discharge Instructions (Signed)

## 2021-02-12 NOTE — Progress Notes (Signed)
Nutrition Follow-up  DOCUMENTATION CODES:   Not applicable  INTERVENTION:   -Continue Ensure Enlive po daily, each supplement provides 350 kcal and 20 grams of protein  -Continue Magic cup TID with meals, each supplement provides 290 kcal and 9 grams of protein  -MVI with minerals daily -Reviewed CHF diet guidelines; provided "Low Sodium Nutrition Therapy" handout and attached to AVS/ discharge instructions  NUTRITION DIAGNOSIS:   Increased nutrient needs related to acute illness as evidenced by estimated needs.  Ongoing  GOAL:   Patient will meet greater than or equal to 90% of their needs  Progressing   MONITOR:   PO intake, Supplement acceptance, Labs, Weight trends, I & O's  REASON FOR ASSESSMENT:   Consult Assessment of nutrition requirement/status  ASSESSMENT:   75 yo female with a PMH of CAD s/p CABG x2 in 2016, chronic combined CHF/ischemic cardiomyopathy with EF of 30-35% on Echo in 2019 with normalization per last echocardiogram, persistent A-fib s/p recent AV nodal ablation with PPM placement, COPD, HTN, HLD, pre-diabetes, CKD stage 3, GERD, medication non-compliance, and prior tobacco use (quit in 2016) who presents with acute on chronic CHF.  Reviewed I/O's: -440 ml x 24 hours and -1 L since admission  UOP: 850 ml x 24 hours  Case discussed with RN and RNCM. Requesting RD consult for CHF diet education prior to discharge.   Pt pacing around room in street clothes at time of visit. She reports she is eager to go home. As soon as RD introduced self and purpose for visit, pt sat on the edge of her bed and closed her eyes. Pt shares that she eats out frequently at [employer] and consumed "salty chicken wings" at home. Efforts to engage pt in conversation were minimally successful. Pt reports, "I was supposed to leave tomorrow, but they are letting me leave today. But I may be back tomorrow, especially if I don't do as I'm told". RD discussed importance for CHF management  and how limiting sodium in diet and monitoring weight can help prevent further hospitalizations. Teachback used. Expect poor compliance.   Pt with fair oral intake. Noted meal completion 25-75%.   Medications reviewed and include lasix.   Labs reviewed: CBGS: 150-251 (inpatient orders for glycemic control are 0-5 units insulin aspart daily at bedtime).    NUTRITION - FOCUSED PHYSICAL EXAM:  Flowsheet Row Most Recent Value  Orbital Region No depletion  Upper Arm Region No depletion  Thoracic and Lumbar Region No depletion  Buccal Region No depletion  Temple Region No depletion  Clavicle Bone Region No depletion  Clavicle and Acromion Bone Region No depletion  Scapular Bone Region No depletion  Dorsal Hand No depletion  Patellar Region No depletion  Anterior Thigh Region No depletion  Posterior Calf Region No depletion  Edema (RD Assessment) Mild  Hair Reviewed  Eyes Reviewed  Mouth Reviewed  Skin Reviewed  Nails Reviewed       Diet Order:   Diet Order             Diet - low sodium heart healthy           Diet Heart Room service appropriate? Yes; Fluid consistency: Thin  Diet effective now                   EDUCATION NEEDS:   No education needs have been identified at this time  Skin:  Skin Assessment: Reviewed RN Assessment  Last BM:  02/12/21  Height:   Ht  Readings from Last 1 Encounters:  02/10/21 5' 7.5" (1.715 m)    Weight:   Wt Readings from Last 1 Encounters:  02/12/21 88.4 kg    Ideal Body Weight:  62.5 kg  BMI:  Body mass index is 30.06 kg/m.  Estimated Nutritional Needs:   Kcal:  1610-9604  Protein:  100-115 grams  Fluid:  >1.75 L    Loistine Chance, RD, LDN, [place] Registered Dietitian II Certified Diabetes Care and Education Specialist Please refer to [facility] for RD and/or RD on-call/weekend/after hours pager

## 2021-02-12 NOTE — Progress Notes (Signed)
Progress Note  Patient Name: [name] [name] Date of Encounter: 02/12/2021  [facility] Cardiologist: [name], MD   Subjective   Incomplete I/Os but recorded as net negative 440cc.  Creatinine stable (1.6 > 1.5).  Weight down 1 pound from yesterday, 2 pounds on admission.  BP stable at 125/80.  Denies any dyspnea, adamant about discharge today.   Inpatient Medications    Scheduled Meds:  allopurinol  300 mg Oral Daily   atorvastatin  10 mg Oral QHS   feeding supplement  237 mL Oral Q24H   furosemide  40 mg Intravenous BID   guaiFENesin  600 mg Oral BID   insulin aspart  0-5 Units Subcutaneous QHS   insulin aspart  0-9 Units Subcutaneous TID WC   losartan  25 mg Oral Daily   metoprolol succinate  100 mg Oral BID   multivitamin with minerals  1 tablet Oral Daily   pantoprazole  40 mg Oral q AM   predniSONE  40 mg Oral Q breakfast   rivaroxaban  20 mg Oral Q supper   sodium chloride flush  3 mL Intravenous Q12H   Continuous Infusions:  sodium chloride     PRN Meds: sodium chloride, acetaminophen, albuterol, melatonin, ondansetron (ZOFRAN) IV, sodium chloride, sodium chloride flush   Vital Signs    Vitals:   02/11/21 1147 02/11/21 2024 02/12/21 0218 02/12/21 0226  BP: (!) 152/99 116/79  125/89  Pulse: 91 92  90  Resp: 16 16  18   Temp: 97.8 F (36.6 C) 97.8 F (36.6 C)  (!) 97.5 F (36.4 C)  TempSrc:  Oral  Oral  SpO2: 91% 100%  100%  Weight:   88.4 kg   Height:        Intake/Output Summary (Last 24 hours) at 02/12/2021 0852 Last data filed at 02/11/2021 2340 Gross per 24 hour  Intake 410 ml  Output 850 ml  Net -440 ml    Last 3 Weights 02/12/2021 02/11/2021 02/10/2021  Weight (lbs) 194 lb 12.8 oz 195 lb 8 oz 197 lb 3.2 oz  Weight (kg) 88.361 kg 88.678 kg 89.449 kg      Telemetry    V paced at 90- Personally Reviewed  ECG    No new ECG - Personally Reviewed  Physical Exam   GEN: No acute distress.   Neck: + JVD Cardiac: RRR, no murmurs, rubs,  or gallops.  Respiratory: Clear to auscultation bilaterally. GI: Soft, nontender, non-distended  MS: no edema Neuro:  Nonfocal  Psych: Normal affect   Labs    High Sensitivity Troponin:   Recent Labs  Lab 02/10/21 0500 02/10/21 0700  TROPONINIHS 29* 30*       Chemistry Recent Labs  Lab 02/10/21 0417 02/11/21 0203 02/12/21 0330  NA 141 139 138  K 4.1 4.1 4.3  CL 113* 107 108  CO2 18* 19* 19*  GLUCOSE 129* 211* 155*  BUN 30* 34* 44*  CREATININE 1.30* 1.57* 1.51*  CALCIUM 9.6 9.5 9.4  GFRNONAA 43* 34* 36*  ANIONGAP 10 13 11       Hematology Recent Labs  Lab 02/10/21 0417  WBC 7.8  RBC 5.24*  HGB 14.1  HCT 45.0  MCV 85.9  MCH 26.9  MCHC 31.3  RDW 21.2*  PLT 147*     BNP Recent Labs  Lab 02/10/21 0510  BNP 1,207.4*      DDimer No results for input(s): DDIMER in the last 168 hours.   Radiology    ECHOCARDIOGRAM  LIMITED  Result Date: 02/11/2021    ECHOCARDIOGRAM LIMITED REPORT   Patient Name:   [name] [name] Date of Exam: 02/11/2021 Medical Rec #:  [id number]     Height:       67.5 in Accession #:    [id number]    Weight:       195.5 lb Date of Birth:  [birth year]     BSA:          2.014 m Patient Age:    75 years      BP:           135/95 mmHg Patient Gender: F             HR:           90 bpm. Exam Location:  Inpatient Procedure: Limited Echo, Cardiac Doppler and Limited Color Doppler Indications:    I50.23 Acute on chronic systolic (congestive) heart failure  History:        Patient has prior history of Echocardiogram examinations, most                 recent 12/08/2020. Cardiomyopathy, CAD, Prior CABG and Pacemaker,                 COPD and Carotid Disease, Arrythmias:Atrial Fibrillation; Risk                 Factors:Hypertension. GERD. CKD.  Sonographer:    Jonelle Sidle Dance Referring Phys: [id number] [place]  1. Left ventricular ejection fraction, by estimation, is 25 to 30%. The left ventricle has severely decreased function. The left  ventricle demonstrates global hypokinesis. There is mild concentric left ventricular hypertrophy. Left ventricular diastolic  function could not be evaluated.  2. Right ventricular systolic function is severely reduced. The right ventricular size is moderately enlarged. There is moderately elevated pulmonary artery systolic pressure. The estimated right ventricular systolic pressure is 63.8 mmHg.  3. The mitral valve is grossly normal. Severe mitral valve regurgitation. No evidence of mitral stenosis.  4. The tricuspid valve is abnormal. Tricuspid valve regurgitation is severe.  5. Left atrial size was severely dilated.  6. Right atrial size was moderately dilated.  7. The aortic valve is tricuspid. There is moderate calcification of the aortic valve. There is moderate thickening of the aortic valve. Aortic valve regurgitation is mild. Mild to moderate aortic valve sclerosis/calcification is present, without any evidence of aortic stenosis.  8. The inferior vena cava is dilated in size with <50% respiratory variability, suggesting right atrial pressure of 15 mmHg. Comparison(s): Changes from prior study are noted. EF remains unchanged. MR and TR are now severe. RVSP ~53 mmHG. FINDINGS  Left Ventricle: Left ventricular ejection fraction, by estimation, is 25 to 30%. The left ventricle has severely decreased function. The left ventricle demonstrates global hypokinesis. The left ventricular internal cavity size was normal in size. There is mild concentric left ventricular hypertrophy. Left ventricular diastolic function could not be evaluated. Left ventricular diastolic function could not be evaluated due to atrial fibrillation. Right Ventricle: The right ventricular size is moderately enlarged. No increase in right ventricular wall thickness. Right ventricular systolic function is severely reduced. There is moderately elevated pulmonary artery systolic pressure. The tricuspid regurgitant velocity is 3.09 m/s, and with  an assumed right atrial pressure of 15 mmHg, the estimated right ventricular systolic pressure is 75.6 mmHg. Left Atrium: Left atrial size was severely dilated. Right Atrium: Right atrial size was moderately dilated. Pericardium: Trivial pericardial effusion  is present. Mitral Valve: The mitral valve is grossly normal. Mild mitral annular calcification. Severe mitral valve regurgitation. No evidence of mitral valve stenosis. Tricuspid Valve: The tricuspid valve is abnormal. Tricuspid valve regurgitation is severe. No evidence of tricuspid stenosis. The flow in the hepatic veins is reversed during ventricular systole. Aortic Valve: The aortic valve is tricuspid. There is moderate calcification of the aortic valve. There is moderate thickening of the aortic valve. Aortic valve regurgitation is mild. Aortic regurgitation PHT measures 264 msec. Mild to moderate aortic valve sclerosis/calcification is present, without any evidence of aortic stenosis. Pulmonic Valve: The pulmonic valve was grossly normal. Pulmonic valve regurgitation is not visualized. No evidence of pulmonic stenosis. Aorta: The aortic root and ascending aorta are structurally normal, with no evidence of dilitation. Venous: The inferior vena cava is dilated in size with less than 50% respiratory variability, suggesting right atrial pressure of 15 mmHg. Additional Comments: A device lead is visualized in the right atrium and right ventricle. LEFT VENTRICLE PLAX 2D LVIDd:         5.20 cm LVIDs:         3.50 cm LV PW:         1.20 cm LV IVS:        1.40 cm LVOT diam:     1.90 cm LV SV:         31 LV SV Index:   15 LVOT Area:     2.84 cm  RIGHT VENTRICLE          IVC RV Basal diam:  4.10 cm  IVC diam: 3.20 cm RV Mid diam:    2.90 cm LEFT ATRIUM         Index      RIGHT ATRIUM           Index LA diam:    5.90 cm 2.93 cm/m RA Area:     26.70 cm                                RA Volume:   93.30 ml  46.33 ml/m  AORTIC VALVE LVOT Vmax:   78.70 cm/s LVOT  Vmean:  50.150 cm/s LVOT VTI:    0.110 m AI PHT:      264 msec  AORTA Ao Root diam: 3.00 cm Ao Asc diam:  2.80 cm MITRAL VALVE               TRICUSPID VALVE MV Area (PHT): 3.48 cm    TR Peak grad:   38.2 mmHg MV Decel Time: 218 msec    TR Vmax:        309.00 cm/s MR Peak grad: 109.0 mmHg MR Mean grad: 66.0 mmHg    SHUNTS MR Vmax:      522.00 cm/s  Systemic VTI:  0.11 m MR Vmean:     384.0 cm/s   Systemic Diam: 1.90 cm MV E velocity: 97.30 cm/s MV A velocity: 27.00 cm/s MV E/A ratio:  3.60 Eleonore Chiquito MD Electronically signed by Eleonore Chiquito MD Signature Date/Time: 02/11/2021/6:00:46 PM    Final     Cardiac Studies   Echo 12/08/20:  1. Left ventricular ejection fraction, by estimation, is 25 to 30%. Left  ventricular ejection fraction by 3D volume is 22 %. The left ventricle has  severely decreased function. The left ventricle demonstrates global  hypokinesis. The left ventricular  internal cavity size was mildly dilated. There  is mild concentric left  ventricular hypertrophy. Left ventricular diastolic function could not be  evaluated.   2. Right ventricular systolic function is moderately reduced. The right  ventricular size is moderately enlarged. There is moderately elevated  pulmonary artery systolic pressure. The estimated right ventricular  systolic pressure is 41.2 mmHg.   3. Left atrial size was severely dilated.   4. Right atrial size was moderately dilated.   5. The mitral valve is grossly normal. Mild to moderate mitral valve  regurgitation.   6. Tricuspid valve regurgitation is moderate.   7. The aortic valve is tricuspid. There is moderate calcification of the  aortic valve. There is mild thickening of the aortic valve. Aortic valve  regurgitation is trivial. Mild to moderate aortic valve  sclerosis/calcification is present, without any  evidence of aortic stenosis.   8. The inferior vena cava is dilated in size with <50% respiratory  variability, suggesting right atrial  pressure of 15 mmHg.   Patient Profile     75 y.o. female with a hx of CAD s/p CABG x2 in 2016, chronic combined CHF/ischemic cardiomyopathy with EF of 30-35% on Echo in 2019 with normalization per last echocardiogram, persistent atrial fibrillation on Xarelto s/p recent AV nodal ablation with PPM placement, COPD, hypertension, hyperlipidemia, pre-diabetes, CKD stage III, GERD, medication non-compliance, and prior tobacco use (quit in 2016)  Assessment & Plan    Acute on chronic combined systolic and diastolic CHF: EF 87-86%.  Presented with worsening shortness of breath.  Chest x-ray suggests edema.  BNP elevated at 1200.  Echo this admission shows LVEF 25 to 30%, severely reduced RV function, severe MR, severe TR -JVD elevated but suspect this is due to her severe TR, otherwise lungs are clear and no edema.  Weight is 195 pounds, per Dr. Kandis Mannan note her weight after discharge from admission for IV diuresis was 200 pounds.  Suspect she is relatively euvolemic.  She is adamant about discharge today -Continue Toprol XL 100, losartan 25 -Previously did not tolerate Entresto or Jardiance   Permanent atrial fibrillation: Pt recently underwent AV nodal ablation with PPM placement 01/18/21>>discharged 01/19/21. Follow up wound check with normal device functioning. -Continue Toprol-XL, Xarelto for anticoagulation -CHA2DS2-VASc = 6 (CHF, HTN, age >42, CAD, female).    CAD s/p CABG in 2016:  -History of CABG x2 (LIMA to LAD and SVG to OM) in 2016.  -Low risk Myoview in 08/2018 -Denies anginal symptoms -Continue BB, statin -No ASA in the setting of Xarelto   Hypertension: -Stable -Continue Toprol XL 100, losartan   Hyperlipidemia: -Continue home Lipitor 10mg  daily   Acute on Chronic CKD Stage III -Creatinine 1.5 today, which appears to be about baseline 1.3-1.5  [facility] will sign off.   Medication Recommendations: Torsemide 40 mg daily, Toprol-XL 100 mg twice daily, losartan 25 mg daily,  Xarelto Other recommendations (labs, testing, etc):  BMET within 1 week Follow up as an outpatient:  Will schedule f/u.  Has appointment with EP 7/5.    For questions or updates, please contact [place] Please consult [email or web address] for contact info under      Signed, Donato Heinz, MD  02/12/2021, 8:52 AM

## 2021-02-12 NOTE — TOC Initial Note (Signed)
Transition of Care [facility]) - Initial/Assessment Note    Patient Details  Name: [name] [name] MRN: [id number] Date of Birth: [birth year]  Transition of Care [facility]) CM/SW Contact:    Zenon Mayo, RN Phone Number: 02/12/2021, 10:30 AM  Clinical Narrative:                 From home, she has a scale at home, she states she has not been doing good with her diet she has been eating too much salt, NCM informed her to do better. Will see if nutritionist can give her some information.  Expected Discharge Plan: Home/Self Care Barriers to Discharge: No Barriers Identified   Patient Goals and CMS Choice Patient states their goals for this hospitalization and ongoing recovery are:: return home   Choice offered to / list presented to : NA  Expected Discharge Plan and Services Expected Discharge Plan: Home/Self Care   Discharge Planning Services: CM Consult   Living arrangements for the past 2 months: Single Family Home Expected Discharge Date: 02/12/21                 DME Agency: NA       HH Arranged: NA          Prior Living Arrangements/Services Living arrangements for the past 2 months: Single Family Home Lives with:: Self Patient language and need for interpreter reviewed:: Yes Do you feel safe going back to the place where you live?: Yes      Need for Family Participation in Patient Care: Yes (Comment) Care giver support system in place?: Yes (comment)   Criminal Activity/Legal Involvement Pertinent to Current Situation/Hospitalization: No - Comment as needed  Activities of Daily Living      Permission Sought/Granted                  Emotional Assessment   Attitude/Demeanor/Rapport: Engaged Affect (typically observed): Appropriate Orientation: : Oriented to Situation, Oriented to  Time, Oriented to Place, Oriented to Self Alcohol / Substance Use: Not Applicable Psych Involvement: No (comment)  Admission diagnosis:  CHF exacerbation ([place])  [I50.9] Patient Active Problem List   Diagnosis Date Noted   Elevated troponin 02/10/2021   Acute respiratory failure with hypoxia ([place]) 12/08/2020   Hypotension 12/08/2020   Hypoalbuminemia 12/08/2020   Obstructive sleep apnea 09/21/2020   Atrial fibrillation with rapid ventricular response ([place]) 08/13/2020   Acute lower GI bleeding 02/10/2020   Tobacco dependence 02/10/2020   Obesity (BMI 30.0-34.9) 02/10/2020   Acute upper GI bleeding 10/26/2019   COPD (chronic obstructive pulmonary disease) ([place]) 10/26/2019   Acute GI bleeding 10/09/2019   Acute blood loss anemia 10/09/2019   Postmenopausal vaginal bleeding 01/04/2018   Snoring 01/04/2018   Acute on chronic combined systolic and diastolic CHF (congestive heart failure) ([place])    CHF exacerbation ([place]) 10/16/2017   Noncompliance 10/16/2017   Acute on chronic congestive heart failure (HCC)    Thrombocytopenia ([place]) 09/14/2017   Acute drug-induced gout of right foot    Medication noncompliance due to cognitive impairment 76/19/5093   Acute diastolic CHF (congestive heart failure) ([place]) 08/06/2017   LGI bleed, likely hemorrhoids 08/06/2017   Persistent atrial fibrillation ([place]) 08/04/2017   Paroxysmal atrial fibrillation ([place]) 02/08/2017   Cardiomyopathy, ischemic 02/08/2017   Dysphagia 02/08/2017   CKD (chronic kidney disease), stage III ([place]) 01/30/2017   Pain in shoulder 02/08/2016   Breast pain, left 02/08/2016   Bilateral arm numbness and tingling while sleeping 02/08/2016   Painful lumpy  left breast 09/23/2015   [place] intertrigo 02/11/2015   S/P CABG x 2 11/14/2014   Accelerated hypertension    Cardiac arrest ([place]) 11/04/2014   Left main coronary artery disease 11/04/2014   Coronary artery disease due to lipid rich plaque    Acute respiratory failure with hypoxemia [facility])    Essential hypertension    Atrial fibrillation with RVR (HCC)    Hypokalemia 10/30/2014   Chronic combined systolic and diastolic CHF  (congestive heart failure) ([place]) 10/30/2014   NSTEMI (non-ST elevated myocardial infarction) ([place])    DOE (dyspnea on exertion)    CAP (community acquired pneumonia) 10/29/2014   PCP:  [name], MD Pharmacy:   [facility] - [place], [place] - [address] [place] [address] Phone: [phone] Fax: [phone]  [facility] - [place], [place] - [address] [place] [address] Phone: [phone] Fax: [phone]     Social Determinants of Health ([place]) Interventions    Readmission Risk Interventions Readmission Risk Prevention Plan 02/12/2021 12/09/2020  Transportation Screening Complete Complete  PCP or Specialist Appt within 3-5 Days Complete Complete  HRI or [place] Complete Complete  Social Work Consult for [place] Planning/Counseling Complete Complete  Palliative Care Screening Not Applicable Complete  Medication Review Press photographer) Complete Complete  Some recent data might be hidden

## 2021-02-12 NOTE — Telephone Encounter (Signed)
error 

## 2021-02-16 ENCOUNTER — Encounter: Payer: HMO | Admitting: Student

## 2021-02-16 NOTE — Progress Notes (Deleted)
Electrophysiology Office Note Date: 02/16/2021  ID:  Kaliah, Haddaway 1945-11-14, MRN [id number]  PCP: [name], MD Primary Cardiologist: [name], MD Electrophysiologist: [name], MD ***  CC: Pacemaker follow-up  CHARLIE CHAR is a 75 y.o. female seen today for [name], MD for {Blank single:19197::"cardiac clearance","post hospital follow up","acute visit due to ***","routine electrophysiology followup"}.  Since {Blank single:19197::"last being seen in our clinic","discharge from hospital"} the patient reports doing ***.  she denies chest pain, palpitations, dyspnea, PND, orthopnea, nausea, vomiting, dizziness, syncope, edema, weight gain, or early satiety.  Device History: {Blank single:19197::"Medtronic","St. Jude","[employer] Scientific","Biotronik"} {Blank single:19197::"Dual Chamber","Single Chamber","BiV","Leadless"} PPM implanted *** for ***  Past Medical History:  Diagnosis Date   (HFimpEF) heart failure with improved ejection fraction ([place])    a. 07/2018 Echo: EF 30-35%; b. 11/2019 Echo: EF 55-60%, no rwma, Gr2 DD, Nl RV size/fxn. Mild BAE. Mild MR/AI.   Arthritis    Asthma    Atopic dermatitis    CAD (coronary artery disease)    a. 2016 s/p CABG x 2 (LIMA->LAD, VG->OM); b. 08/2018 MV: EF 44%, no ischemia/infact.   Cardiac arrest ([place]) 10/2014   Cardiomyopathy, ischemic    a. 07/2018 Echo: EF 30-35%; 11/2019 Echo: EF 55-60%.   Carotid arterial disease ([place])    a. 11/2019 Carotid U/S   CKD (chronic kidney disease), stage III (HCC)    COPD (chronic obstructive pulmonary disease) ([place])    Esophageal dilatation 2013   GERD (gastroesophageal reflux disease)    Gout    Headache    Hypertension    Hypokalemia    Idiopathic angioedema    LGI bleed 08/06/2017   a. felt to be hemorrhoidal during that admission (no drop in Hgb).   Lower back pain    Paroxysmal atrial fibrillation (HCC)    a. Dx 2016-->h/o difficult to control rates (complicated by  noncompliance), not felt to be a candidate for ablation or antiarrhythmic due to noncompliance; b. Recurrent AF 2021 - converted w/ IV dilt; c. CHA2DS2VASc = 7-->Xarelto.   Personal history of noncompliance with medical treatment, presenting hazards to health    Prediabetes    S/P CABG x 2 with clipping of LA appendage 11/14/2014   LIMA to LAD, SVG to OM, EVH via right thigh   Urine incontinence    Uterine fibroid    Past Surgical History:  Procedure Laterality Date   AV NODE ABLATION N/A 01/18/2021   Procedure: AV NODE ABLATION;  Surgeon: Evans Lance, MD;  Location: [place] [facility];  Service: Cardiovascular;  Laterality: N/A;   BALLOON DILATION N/A 10/10/2019   Procedure: BALLOON DILATION;  Surgeon: Otis Brace, MD;  Location: [facility];  Service: Gastroenterology;  Laterality: N/A;   BIOPSY  10/10/2019   Procedure: BIOPSY;  Surgeon: Otis Brace, MD;  Location: [place];  Service: Gastroenterology;;   BIOPSY  10/11/2019   Procedure: BIOPSY;  Surgeon: Otis Brace, MD;  Location: [place];  Service: Gastroenterology;;   CARDIOVERSION N/A 11/18/2014   Procedure: CARDIOVERSION;  Surgeon: [name], MD;  Location: [place];  Service: Cardiovascular;  Laterality: N/A;   CARDIOVERSION N/A 12/25/2017   Procedure: CARDIOVERSION;  Surgeon: [name], MD;  Location: [place];  Service: Cardiovascular;  Laterality: N/A;   CLIPPING OF ATRIAL APPENDAGE N/A 11/14/2014   Procedure: CLIPPING OF ATRIAL APPENDAGE;  Surgeon: [name], MD;  Location: [place];  Service: Open Heart Surgery;  Laterality: N/A;   COLONOSCOPY  2013  COLONOSCOPY WITH PROPOFOL N/A 10/11/2019   Procedure: COLONOSCOPY WITH PROPOFOL;  Surgeon: Otis Brace, MD;  Location: [facility];  Service: Gastroenterology;  Laterality: N/A;   CORONARY ARTERY BYPASS GRAFT N/A 11/14/2014   Procedure: CORONARY ARTERY BYPASS GRAFTING (CABG)TIMES 2 USING LEFT INTERNAL MAMMARY ARTERY AND  RIGHT SAPHENOUS VEIN HARVESTED ENDOSCOPICALLY;  Surgeon: [name], MD;  Location: [place];  Service: Open Heart Surgery;  Laterality: N/A;   ESOPHAGOGASTRODUODENOSCOPY (EGD) WITH PROPOFOL N/A 10/10/2019   Procedure: ESOPHAGOGASTRODUODENOSCOPY (EGD) WITH PROPOFOL;  Surgeon: Otis Brace, MD;  Location: [place];  Service: Gastroenterology;  Laterality: N/A;   ESOPHAGOGASTRODUODENOSCOPY (EGD) WITH PROPOFOL N/A 10/27/2019   Procedure: ESOPHAGOGASTRODUODENOSCOPY (EGD) WITH PROPOFOL;  Surgeon: Ronnette Juniper, MD;  Location: [place];  Service: Gastroenterology;  Laterality: N/A;   GIVENS CAPSULE STUDY N/A 10/27/2019   Procedure: GIVENS CAPSULE STUDY;  Surgeon: Ronnette Juniper, MD;  Location: [place];  Service: Gastroenterology;  Laterality: N/A;   LEFT HEART CATHETERIZATION WITH CORONARY ANGIOGRAM N/A 11/04/2014   Procedure: LEFT HEART CATHETERIZATION WITH CORONARY ANGIOGRAM;  Surgeon: Troy Sine, MD;  Location: [facility];  Service: Cardiovascular;  Laterality: N/A;   PACEMAKER IMPLANT N/A 01/18/2021   Procedure: PACEMAKER IMPLANT;  Surgeon: Evans Lance, MD;  Location: [facility];  Service: Cardiovascular;  Laterality: N/A;   PACEMAKER IMPLANT Left    POLYPECTOMY  10/11/2019   Procedure: POLYPECTOMY;  Surgeon: Otis Brace, MD;  Location: [facility];  Service: Gastroenterology;;   TEE WITHOUT CARDIOVERSION N/A 11/14/2014   Procedure: TRANSESOPHAGEAL ECHOCARDIOGRAM (TEE);  Surgeon: [name], MD;  Location: [place];  Service: Open Heart Surgery;  Laterality: N/A;   TEE WITHOUT CARDIOVERSION N/A 12/25/2017   Procedure: TRANSESOPHAGEAL ECHOCARDIOGRAM (TEE);  Surgeon: [name], MD;  Location: [facility]OS[facility];  Service: Cardiovascular;  Laterality: N/A;   TEMPORARY PACEMAKER INSERTION  11/04/2014   Procedure: TEMPORARY PACEMAKER INSERTION;  Surgeon: Troy Sine, MD;  Location: [facility] LAB;  Service: Cardiovascular;;   TOOTH EXTRACTION      Current  Outpatient Medications  Medication Sig Dispense Refill   acetaminophen (TYLENOL) 325 MG tablet Take 2 tablets (650 mg total) by mouth every 4 (four) hours as needed for headache or mild pain.     allopurinol (ZYLOPRIM) 300 MG tablet Take 1 tablet (300 mg total) by mouth daily. 30 tablet 11   atorvastatin (LIPITOR) 10 MG tablet Take 10 mg by mouth at bedtime.     losartan (COZAAR) 25 MG tablet Take 1 tablet (25 mg total) by mouth daily. 90 tablet 3   metFORMIN (GLUCOPHAGE) 500 MG tablet Take 500 mg by mouth daily with breakfast. Daily with a meal     metoprolol succinate (TOPROL-XL) 100 MG 24 hr tablet Take 1 tablet (100 mg total) by mouth 2 (two) times daily. Take with or immediately following a meal. 180 tablet 3   pantoprazole (PROTONIX) 40 MG tablet Take 40 mg by mouth in the morning.     rivaroxaban (XARELTO) 20 MG TABS tablet Take 1 tablet (20 mg total) by mouth daily with supper. Resume from 11/01/2019     Tiotropium Bromide Monohydrate (SPIRIVA RESPIMAT) 2.5 MCG/ACT AERS Inhale 2 puffs into the lungs daily. 1 g 5   torsemide (DEMADEX) 20 MG tablet Take 2 tablets (40 mg total) by mouth daily. 60 tablet 0   No current facility-administered medications for this visit.    Allergies:   Bee venom, Ivp dye [iodinated diagnostic agents], Ace inhibitors, Atenolol, Codeine, Entresto [  sacubitril-valsartan], Penicillin g, Shrimp [shellfish allergy], Simvastatin, and Jardiance [empagliflozin]   Social History: Social History   Socioeconomic History   Marital status: Widowed    Spouse name: Not on file   Number of children: 1   Years of education: 1   Highest education level: Not on file  Occupational History   Occupation: RETIRED [employer]  Tobacco Use   Smoking status: Every Day    Packs/day: 0.50    Years: 56.00    Pack years: 28.00    Types: Cigarettes   Smokeless tobacco: Never   Tobacco comments:    smoking 2 cigarettes/day as of 10/23/20  Vaping Use   Vaping Use:  Never used  Substance and Sexual Activity   Alcohol use: No    Alcohol/week: 0.0 standard drinks   Drug use: No   Sexual activity: Not on file  Other Topics Concern   Not on file  Social History Narrative   Patient reports it being difficult to pay for everything due to outstanding medical bills from hospital stay last year but states she is able to keep up with basic expenses.  Patient does have some concerns with her house's condition due to a water leak- had her roof repaired last month but has some leaking in the front which has affected her porch.  Patient owns her own car and is able to drive herself but has some concerns about the reliability of her car- has gotten taxi to the clinic for appointment in the past when her car broke down.   Social Determinants of Health   Financial Resource Strain: Not on file  Food Insecurity: Not on file  Transportation Needs: Not on file  Physical Activity: Not on file  Stress: Not on file  Social Connections: Not on file  Intimate Partner Violence: Not on file    Family History: Family History  Problem Relation Age of Onset   Cancer Mother        LYMPHOMA   Heart disease Father    Other Father        TB   CVA Sister    Prostate cancer Brother 28   Diabetes Brother      Review of Systems: All other systems reviewed and are otherwise negative except as noted above.  Physical Exam: There were no vitals filed for this visit.   GEN- The patient is well appearing, alert and oriented x 3 today.   HEENT: normocephalic, atraumatic; sclera clear, conjunctiva pink; hearing intact; oropharynx clear; neck supple  Lungs- Clear to ausculation bilaterally, normal work of breathing.  No wheezes, rales, rhonchi Heart- Regular rate and rhythm, no murmurs, rubs or gallops  GI- soft, non-tender, non-distended, bowel sounds present  Extremities- no clubbing or cyanosis. No edema MS- no significant deformity or atrophy Skin- warm and dry, no rash or  lesion; PPM pocket well healed Psych- euthymic mood, full affect Neuro- strength and sensation are intact  PPM Interrogation- reviewed in detail today,  See PACEART report  EKG:  EKG {ACTION; IS/IS TDV:[id number]} ordered today. The ekg ordered today shows ***  Recent Labs: 08/13/2020: TSH 1.246 12/08/2020: ALT 14 02/10/2021: B Natriuretic Peptide 1,207.4; Hemoglobin 14.1; Platelets 147 02/11/2021: Magnesium 2.1 02/12/2021: BUN 44; Creatinine, Ser 1.51; Potassium 4.3; Sodium 138   Wt Readings from Last 3 Encounters:  02/12/21 194 lb 12.8 oz (88.4 kg)  01/19/21 197 lb 15.6 oz (89.8 kg)  12/31/20 193 lb (87.5 kg)     Other studies Reviewed: Additional  studies/ records that were reviewed today include: Previous EP office notes, Previous remote checks, Most recent labwork.   Limited Echo 02/11/21 LVEF 25-30%, severely reduced RV function Echo 12/08/20 with LVEF 25-30% and at least moderately reduced RV function  Assessment and Plan:  1.  Uncontrolled AF s/p AV nodal ablation and Biotronik PPM 01/18/2021 Normal PPM function See Pace Art report No changes today   Current medicines are reviewed at length with the patient today.   The patient {ACTIONS; HAS/DOES NOT HAVE:19233} concerns regarding her medicines.  The following changes were made today:  {NONE DEFAULTED:18576}  Labs/ tests ordered today include: *** No orders of the defined types were placed in this encounter.    Disposition:   Follow up with {Blank single:19197::"Dr. Allred","Dr. Arlan Organ. Klein","Dr. Camnitz","Dr. Lambert","EP APP"} in *** {Blank single:19197::"Months","Weeks"}    Signed, Shirley Friar, PA-C  02/16/2021 9:21 AM  [facility] [address] [place] [address] [phone] (office) [phone] (fax)

## 2021-02-26 ENCOUNTER — Telehealth (HOSPITAL_COMMUNITY): Payer: Self-pay | Admitting: *Deleted

## 2021-02-26 DIAGNOSIS — N1831 Chronic kidney disease, stage 3a: Secondary | ICD-10-CM | POA: Diagnosis not present

## 2021-02-26 DIAGNOSIS — Z950 Presence of cardiac pacemaker: Secondary | ICD-10-CM | POA: Diagnosis not present

## 2021-02-26 DIAGNOSIS — D696 Thrombocytopenia, unspecified: Secondary | ICD-10-CM | POA: Diagnosis not present

## 2021-02-26 DIAGNOSIS — I5023 Acute on chronic systolic (congestive) heart failure: Secondary | ICD-10-CM | POA: Diagnosis not present

## 2021-02-26 DIAGNOSIS — I700 Atherosclerosis of aorta: Secondary | ICD-10-CM | POA: Diagnosis not present

## 2021-02-26 NOTE — Telephone Encounter (Signed)
Called [name] [name] to schedule hospital follow up per Adline Potter. [name] [name] did not answer appt scheduled and appt card mailed to pts home address.

## 2021-03-08 DIAGNOSIS — M8580 Other specified disorders of bone density and structure, unspecified site: Secondary | ICD-10-CM | POA: Diagnosis not present

## 2021-03-08 DIAGNOSIS — E7800 Pure hypercholesterolemia, unspecified: Secondary | ICD-10-CM | POA: Diagnosis not present

## 2021-03-08 DIAGNOSIS — K219 Gastro-esophageal reflux disease without esophagitis: Secondary | ICD-10-CM | POA: Diagnosis not present

## 2021-03-08 DIAGNOSIS — N183 Chronic kidney disease, stage 3 unspecified: Secondary | ICD-10-CM | POA: Diagnosis not present

## 2021-03-08 DIAGNOSIS — J449 Chronic obstructive pulmonary disease, unspecified: Secondary | ICD-10-CM | POA: Diagnosis not present

## 2021-03-08 DIAGNOSIS — I10 Essential (primary) hypertension: Secondary | ICD-10-CM | POA: Diagnosis not present

## 2021-03-08 DIAGNOSIS — I2510 Atherosclerotic heart disease of native coronary artery without angina pectoris: Secondary | ICD-10-CM | POA: Diagnosis not present

## 2021-03-08 DIAGNOSIS — E1122 Type 2 diabetes mellitus with diabetic chronic kidney disease: Secondary | ICD-10-CM | POA: Diagnosis not present

## 2021-03-08 DIAGNOSIS — I4891 Unspecified atrial fibrillation: Secondary | ICD-10-CM | POA: Diagnosis not present

## 2021-03-08 DIAGNOSIS — I214 Non-ST elevation (NSTEMI) myocardial infarction: Secondary | ICD-10-CM | POA: Diagnosis not present

## 2021-03-08 DIAGNOSIS — M1990 Unspecified osteoarthritis, unspecified site: Secondary | ICD-10-CM | POA: Diagnosis not present

## 2021-03-08 DIAGNOSIS — I509 Heart failure, unspecified: Secondary | ICD-10-CM | POA: Diagnosis not present

## 2021-03-11 ENCOUNTER — Encounter (HOSPITAL_COMMUNITY): Payer: Self-pay

## 2021-03-11 ENCOUNTER — Telehealth (HOSPITAL_COMMUNITY): Payer: Self-pay | Admitting: Pharmacy Technician

## 2021-03-11 ENCOUNTER — Ambulatory Visit (HOSPITAL_COMMUNITY)
Admission: RE | Admit: 2021-03-11 | Discharge: 2021-03-11 | Disposition: A | Discharge status: Home / Self Care | Payer: HMO | Source: Ambulatory Visit | Attending: Adult Health | Admitting: Adult Health

## 2021-03-11 ENCOUNTER — Other Ambulatory Visit: Payer: Self-pay

## 2021-03-11 ENCOUNTER — Other Ambulatory Visit (HOSPITAL_COMMUNITY): Payer: Self-pay

## 2021-03-11 VITALS — BP 130/78 | HR 92 | Wt 190.0 lb

## 2021-03-11 DIAGNOSIS — Z79899 Other long term (current) drug therapy: Secondary | ICD-10-CM | POA: Insufficient documentation

## 2021-03-11 DIAGNOSIS — I5042 Chronic combined systolic (congestive) and diastolic (congestive) heart failure: Secondary | ICD-10-CM | POA: Diagnosis not present

## 2021-03-11 DIAGNOSIS — I428 Other cardiomyopathies: Secondary | ICD-10-CM | POA: Diagnosis not present

## 2021-03-11 DIAGNOSIS — I130 Hypertensive heart and chronic kidney disease with heart failure and stage 1 through stage 4 chronic kidney disease, or unspecified chronic kidney disease: Secondary | ICD-10-CM | POA: Insufficient documentation

## 2021-03-11 DIAGNOSIS — Z8249 Family history of ischemic heart disease and other diseases of the circulatory system: Secondary | ICD-10-CM | POA: Diagnosis not present

## 2021-03-11 DIAGNOSIS — I4589 Other specified conduction disorders: Secondary | ICD-10-CM | POA: Diagnosis not present

## 2021-03-11 DIAGNOSIS — I10 Essential (primary) hypertension: Secondary | ICD-10-CM

## 2021-03-11 DIAGNOSIS — Z951 Presence of aortocoronary bypass graft: Secondary | ICD-10-CM | POA: Diagnosis not present

## 2021-03-11 DIAGNOSIS — I4821 Permanent atrial fibrillation: Secondary | ICD-10-CM | POA: Insufficient documentation

## 2021-03-11 DIAGNOSIS — I4819 Other persistent atrial fibrillation: Secondary | ICD-10-CM | POA: Diagnosis not present

## 2021-03-11 DIAGNOSIS — Z7984 Long term (current) use of oral hypoglycemic drugs: Secondary | ICD-10-CM | POA: Insufficient documentation

## 2021-03-11 DIAGNOSIS — N183 Chronic kidney disease, stage 3 unspecified: Secondary | ICD-10-CM | POA: Insufficient documentation

## 2021-03-11 DIAGNOSIS — F17210 Nicotine dependence, cigarettes, uncomplicated: Secondary | ICD-10-CM | POA: Diagnosis not present

## 2021-03-11 DIAGNOSIS — I255 Ischemic cardiomyopathy: Secondary | ICD-10-CM | POA: Diagnosis not present

## 2021-03-11 DIAGNOSIS — Z833 Family history of diabetes mellitus: Secondary | ICD-10-CM | POA: Insufficient documentation

## 2021-03-11 DIAGNOSIS — I2510 Atherosclerotic heart disease of native coronary artery without angina pectoris: Secondary | ICD-10-CM | POA: Diagnosis not present

## 2021-03-11 DIAGNOSIS — Z720 Tobacco use: Secondary | ICD-10-CM | POA: Diagnosis not present

## 2021-03-11 DIAGNOSIS — Z7901 Long term (current) use of anticoagulants: Secondary | ICD-10-CM | POA: Insufficient documentation

## 2021-03-11 LAB — BASIC METABOLIC PANEL
Anion gap: 11 (ref 5–15)
BUN: 36 mg/dL — ABNORMAL HIGH (ref 8–23)
CO2: 22 mmol/L (ref 22–32)
Calcium: 9.2 mg/dL (ref 8.9–10.3)
Chloride: 109 mmol/L (ref 98–111)
Creatinine, Ser: 1.35 mg/dL — ABNORMAL HIGH (ref 0.44–1.00)
GFR, Estimated: 41 mL/min — ABNORMAL LOW (ref >60)
Glucose, Bld: 132 mg/dL — ABNORMAL HIGH (ref 70–99)
Potassium: 3.5 mmol/L (ref 3.5–5.1)
Sodium: 142 mmol/L (ref 135–145)

## 2021-03-11 NOTE — Telephone Encounter (Signed)
Patient Advocate Encounter   Received notification from [facility] that prior authorization for [name] [name] is required.   PA submitted on CoverMyMeds Key [id number] Status is pending   Patient has tried and failed Jardiance in the past, which is preferred with her insurance.  Will continue to follow.

## 2021-03-11 NOTE — Patient Instructions (Addendum)
START Farxiga '10mg'$  daily. (Will send to pharmacy after our pharmacy team works on prior [place]) Routine lab work today. Will notify you of abnormal results  Follow up with PharmD in 3-4 weeks.  Follow up in 6 weeks.   Do the following things EVERYDAY: Weigh yourself in the morning before breakfast. Write it down and keep it in a log. Take your medicines as prescribed Eat low salt foods--Limit salt (sodium) to 2000 mg per day.  Stay as active as you can everyday Limit all fluids for the day to less than 2 liters

## 2021-03-11 NOTE — Progress Notes (Signed)
PCP: Dr Lysle Rubens  Primary Cardiologist: [name]  HF MD: Dr Haroldine Laws  EP: Dr Lovena Le  HPI: [name] [name] is a 75 y.o. female with h/o HTN, former tobacco abuse, permanent AF, CAD s/p CABG x 2 (LIMA to LAD and SVG to OM) in 2016, Chronic combined CHF, Ischemic CMP, HLD, and Biotronik PPM.   She was initially noted to be in atrial fibrillation in 2016 with a reduced LVEF, leading to ischemic workup with a cardiac cath showing severe multivessel CAD with LM involvement in which she underwent CABG x2 utilizing LIMA to LAD, SVG to OM.   Admitted 1/19 for AF with RVR in setting of Influenza A. EP was consulted to discuss plans for Tikosyn vs. ablation vs. medication rate control however, due to her known medication noncompliance she was found to not be an ablation nor antiarrhythmic candidate   Admitted 10/2017  with recurrent Afib RVR and ADHF. Started on diltiazem. Echo repeated which showed fall in EF from previous. Diuresed with IV lasix. Discharged home on diltiazem 360, Torpol 100, digoxin and Xarelto.   2019 back in  Afib. She was started on amiodarone and diltiazem was DCd. She was also started on Entresto. She underwent successful atrial flutter TEE/DCCV on 12/25/17.  Later went back in A fib. Toprol DC'd with ongoing bradycardia. She later placed on low dose carvedilol.   Intolerant entresto due to chest pain.    Admitted 3/21 for iron-def anemia. Hemoglobin was 5.4.  She was started on IV Protonix.  GI was consulted.  Underwent EGD/colonoscopy which were unremarkable and capsule endoscopy which showed duodenal ulcer with bleeding and stigmata.  GI recommended oral Protonix and outpatient follow-up.  Admitted 01/18/21 for AV  node ablation and PPM due to uncontrolled heart rates with A fib. Underwent Biotronix PPM. Toprol XL and digoxin stopped. Carvedilol 25 mg twice a day started. She was discharged 01/19/21.   She called Dr Forde Dandy office 05/07/21 due to headache after starting carvedilol.  Carvedilol was stopped and she was switched back to Toprol XL.    Admitted 0000000 with a/c systolic heart failure. Diuresed with IV lasix and transitioned to torsemide 40 mg daily. Echo completed and showed EF 25-30% with severe MR/TR    Today she returns for post hospital HF follow up.Overall feeling fair. SOB with exertion. Denies PND/Orthopnea. No bleeding issues. Appetite ok. No fever or chills. Not weighing at home. Has a scale. Taking all medications. Smoking 2 cigarettes per day.   Cardiac Studies   Echo in 3/16 EF 35-40%. Echo in 10/17 EF improved to 50-55%. Echo 5/18 EF stable 50-55% Echo 10/19/17 LVEF 30-35%, Moderate LVH, Mild AI, Moderate regurgitation, Severe LAE, Moderately reduced RV, Severe RAE, PA peak pressure 50 mm Hg.   (Decreased from Echo 01/11/2017 with LVEF 50-55%) Echo 08/03/2018 Ef 30-35% RV mildly dilated.  Echo 11/2019 EF 55-60%  Echo 06/2020 EF 30-35% RV normal  Echo 11/2020 EF 25-30% RV moderately reduced  ECHO 01/2021 EF 25-30% RV severely reduced  Stress Test 1/20- Low Risk EF 44%   ROS: All systems negative except as listed in HPI, PMH and Problem List.  SH:  Social History   Socioeconomic History   Marital status: Widowed    Spouse name: Not on file   Number of children: 1   Years of education: 84   Highest education level: Not on file  Occupational History   Occupation: RETIRED [employer]  Tobacco Use   Smoking status: Every Day  Packs/day: 0.50    Years: 56.00    Pack years: 28.00    Types: Cigarettes   Smokeless tobacco: Never   Tobacco comments:    smoking 2 cigarettes/day as of 10/23/20  Vaping Use   Vaping Use: Never used  Substance and Sexual Activity   Alcohol use: No    Alcohol/week: 0.0 standard drinks   Drug use: No   Sexual activity: Not on file  Other Topics Concern   Not on file  Social History Narrative   Patient reports it being difficult to pay for everything due to outstanding medical bills from hospital  stay last year but states she is able to keep up with basic expenses.  Patient does have some concerns with her house's condition due to a water leak- had her roof repaired last month but has some leaking in the front which has affected her porch.  Patient owns her own car and is able to drive herself but has some concerns about the reliability of her car- has gotten taxi to the clinic for appointment in the past when her car broke down.   Social Determinants of Health   Financial Resource Strain: Not on file  Food Insecurity: Not on file  Transportation Needs: Not on file  Physical Activity: Not on file  Stress: Not on file  Social Connections: Not on file  Intimate Partner Violence: Not on file    FH:  Family History  Problem Relation Age of Onset   Cancer Mother        LYMPHOMA   Heart disease Father    Other Father        TB   CVA Sister    Prostate cancer Brother 41   Diabetes Brother     Past Medical History:  Diagnosis Date   ([place]) heart failure with improved ejection fraction ([place])    a. 07/2018 Echo: EF 30-35%; b. 11/2019 Echo: EF 55-60%, no rwma, Gr2 DD, Nl RV size/fxn. Mild BAE. Mild MR/AI.   Arthritis    Asthma    Atopic dermatitis    CAD (coronary artery disease)    a. 2016 s/p CABG x 2 (LIMA->LAD, VG->OM); b. 08/2018 MV: EF 44%, no ischemia/infact.   Cardiac arrest ([place]) 10/2014   Cardiomyopathy, ischemic    a. 07/2018 Echo: EF 30-35%; 11/2019 Echo: EF 55-60%.   Carotid arterial disease ([place])    a. 11/2019 Carotid U/S   CKD (chronic kidney disease), stage III (HCC)    COPD (chronic obstructive pulmonary disease) ([place])    Esophageal dilatation 2013   GERD (gastroesophageal reflux disease)    Gout    Headache    Hypertension    Hypokalemia    Idiopathic angioedema    LGI bleed 08/06/2017   a. felt to be hemorrhoidal during that admission (no drop in Hgb).   Lower back pain    Paroxysmal atrial fibrillation (HCC)    a. Dx 2016-->h/o difficult to control  rates (complicated by noncompliance), not felt to be a candidate for ablation or antiarrhythmic due to noncompliance; b. Recurrent AF 2021 - converted w/ IV dilt; c. CHA2DS2VASc = 7-->Xarelto.   Personal history of noncompliance with medical treatment, presenting hazards to health    Prediabetes    S/P CABG x 2 with clipping of LA appendage 11/14/2014   LIMA to LAD, SVG to OM, EVH via right thigh   Urine incontinence    Uterine fibroid     Current Outpatient Medications  Medication Sig Dispense  Refill   acetaminophen (TYLENOL) 325 MG tablet Take 2 tablets (650 mg total) by mouth every 4 (four) hours as needed for headache or mild pain.     allopurinol (ZYLOPRIM) 300 MG tablet Take 1 tablet (300 mg total) by mouth daily. 30 tablet 11   atorvastatin (LIPITOR) 10 MG tablet Take 10 mg by mouth at bedtime.     losartan (COZAAR) 25 MG tablet Take 1 tablet (25 mg total) by mouth daily. 90 tablet 3   metFORMIN (GLUCOPHAGE) 500 MG tablet Take 500 mg by mouth daily with breakfast. Daily with a meal     metoprolol succinate (TOPROL-XL) 100 MG 24 hr tablet Take 1 tablet (100 mg total) by mouth 2 (two) times daily. Take with or immediately following a meal. 180 tablet 3   rivaroxaban (XARELTO) 20 MG TABS tablet Take 1 tablet (20 mg total) by mouth daily with supper. Resume from 11/01/2019     Tiotropium Bromide Monohydrate (SPIRIVA RESPIMAT) 2.5 MCG/ACT AERS Inhale 2 puffs into the lungs daily. (Patient taking differently: Inhale 2 puffs into the lungs as needed.) 1 g 5   torsemide (DEMADEX) 20 MG tablet Take 2 tablets (40 mg total) by mouth daily. 60 tablet 0   No current facility-administered medications for this encounter.    Vitals:   03/11/21 1151  BP: 130/78  Pulse: 92  SpO2: 96%  Weight: 86.2 kg   Wt Readings from Last 3 Encounters:  03/11/21 86.2 kg  02/12/21 88.4 kg  01/19/21 89.8 kg    PHYSICAL EXAM: General:  Walked slowly in the clinic.  No resp difficulty HEENT: normal Neck:  supple. JVP flat. Carotids 2+ bilaterally; no bruits. No lymphadenopathy or thryomegaly appreciated. Cor: PMI normal. Regular rate & rhythm. No rubs, gallops or murmurs. Lungs: clear Abdomen: soft, nontender, nondistended. No hepatosplenomegaly. No bruits or masses. Good bowel sounds. Extremities: no cyanosis, clubbing, rash, edema Neuro: alert & orientedx3, cranial nerves grossly intact. Moves all 4 extremities w/o difficulty. Affect pleasant.   ECG: V paced 91 bpm    ASSESSMENT & PLAN:  Chronic Combined /Diastolic Systolic HF Suspect etiology is combined Ischemic/NICM with AF EF back in 2021 had improved to 55-60% but back down  NYHA III. Volume status mildly elevated. Continue current dose of  torsemide and add farxiga 10 mg daily. Dicussed purpose of farxiga.   -Intolerant carvedilol due to headache. Continue Toprol XL at current dose.  - Continue losartan 25 mg daily. Intolerant entresto  - Consider restarting spiro next visit.  - Check BMET - Plan to repeat ECHO after HF meds optimized.  - Refer back to HF Paramedicine.    2. Permanent A fib - S/P AV node ablation and PPM implant.  - On xarelto   3. CAD -s/p CABG x 2 2016 (LIMA to LAD and SVG to OM) - Stress Test 08/2018 low risk. EF 44%.   - Continue statin  - No chest pain  4. HTN  Stable.   5. CKD Stage III Check BMET   6. Tobacco Abuse Discussed smoking cessation   Refer to HF Paramedicine to assist with HF meds.   Follow up with pharm D in 3 weeks and 6 weeks with APP.   Jewell Haught NP-C  1:37 PM

## 2021-03-12 ENCOUNTER — Other Ambulatory Visit (HOSPITAL_COMMUNITY): Payer: Self-pay

## 2021-03-12 ENCOUNTER — Other Ambulatory Visit (HOSPITAL_COMMUNITY): Payer: Self-pay | Admitting: *Deleted

## 2021-03-12 MED ORDER — DAPAGLIFLOZIN PROPANEDIOL 10 MG PO TABS: 10.0000 mg | ORAL_TABLET | Freq: Every day | ORAL | 3 refills | Status: DC

## 2021-03-12 NOTE — Telephone Encounter (Signed)
Advanced Heart Failure Patient Advocate Encounter  Prior Authorization for [facility] [place] has been approved.    PA# [id number] Effective dates: 03/12/21 through 08/14/21  Patients co-pay is $133 (30 days), $377 (90 days)  Was able to get patient a PAN HF grant that will help cover the cost of Farxiga. Sent Jasmine (Arley) a request for a 90 day supply to be sent to the patient's pharmacy. Grant information attached to [employer].  Member ID: [id number] Group ID: [id number] RxBin ID: [id number] PCN: [id number] Eligibility Start Date: 12/12/2020 Eligibility End Date: 03/11/2022 Assistance Amount: $1,000.00  Charlann Boxer, CPhT

## 2021-03-15 ENCOUNTER — Telehealth (HOSPITAL_COMMUNITY): Payer: Self-pay

## 2021-03-15 NOTE — Telephone Encounter (Signed)
Left message for [name] [name] to return my call in reference to establishing paramedicine. I will continue to reach out.

## 2021-03-17 NOTE — Progress Notes (Signed)
Electrophysiology Office Note Date: 03/18/2021  ID:  [name] [name], [name] [name] 1946-07-07, MRN [id number]  PCP: [name], MD Primary Cardiologist: [name], MD Electrophysiologist: [name], MD   CC: Pacemaker follow-up  [name] [name] is a 75 y.o. female seen today for [name], MD for post hospital follow up.   Pt recently had PPM and AV nodal ablation. 01/18/2021.  Admitted 6/29 - 7/1 with acute on chronic systolic CHF   Since discharge from hospital the patient reports doing about the same. Breathing is gradualyl improving. Deneis exertional chest pain. No syncope. Earlier this week she had a sharp pain under her left breast and left arm pit while driving. Worse with AROM of her arm. She took two tylenol and took a nap with resolution. No relation to exertion. She wondered if it was a "gas bubble" as she had milk prior to leaving the house. She still has occasional palpitations. No edema currently.   Device History: Biotronik Single Chamber PPM implanted 01/18/2021 for uncontrolled AF and CHB s/p AVJ ablation  Past Medical History:  Diagnosis Date   (HFimpEF) heart failure with improved ejection fraction ([place])    a. 07/2018 Echo: EF 30-35%; b. 11/2019 Echo: EF 55-60%, no rwma, Gr2 DD, Nl RV size/fxn. Mild BAE. Mild MR/AI.   Arthritis    Asthma    Atopic dermatitis    CAD (coronary artery disease)    a. 2016 s/p CABG x 2 (LIMA->LAD, VG->OM); b. 08/2018 MV: EF 44%, no ischemia/infact.   Cardiac arrest ([place]) 10/2014   Cardiomyopathy, ischemic    a. 07/2018 Echo: EF 30-35%; 11/2019 Echo: EF 55-60%.   Carotid arterial disease ([place])    a. 11/2019 Carotid U/S   CKD (chronic kidney disease), stage III (HCC)    COPD (chronic obstructive pulmonary disease) ([place])    Esophageal dilatation 2013   GERD (gastroesophageal reflux disease)    Gout    Headache    Hypertension    Hypokalemia    Idiopathic angioedema    LGI bleed 08/06/2017   a. felt to be hemorrhoidal during that  admission (no drop in Hgb).   Lower back pain    Paroxysmal atrial fibrillation (HCC)    a. Dx 2016-->h/o difficult to control rates (complicated by noncompliance), not felt to be a candidate for ablation or antiarrhythmic due to noncompliance; b. Recurrent AF 2021 - converted w/ IV dilt; c. CHA2DS2VASc = 7-->Xarelto.   Personal history of noncompliance with medical treatment, presenting hazards to health    Prediabetes    S/P CABG x 2 with clipping of LA appendage 11/14/2014   LIMA to LAD, SVG to OM, EVH via right thigh   Urine incontinence    Uterine fibroid    Past Surgical History:  Procedure Laterality Date   AV NODE ABLATION N/A 01/18/2021   Procedure: AV NODE ABLATION;  Surgeon: Evans Lance, MD;  Location: [place] [facility];  Service: Cardiovascular;  Laterality: N/A;   BALLOON DILATION N/A 10/10/2019   Procedure: BALLOON DILATION;  Surgeon: Otis Brace, MD;  Location: [facility];  Service: Gastroenterology;  Laterality: N/A;   BIOPSY  10/10/2019   Procedure: BIOPSY;  Surgeon: Otis Brace, MD;  Location: [place];  Service: Gastroenterology;;   BIOPSY  10/11/2019   Procedure: BIOPSY;  Surgeon: Otis Brace, MD;  Location: [facility] [place];  Service: Gastroenterology;;   CARDIOVERSION N/A 11/18/2014   Procedure: CARDIOVERSION;  Surgeon: [name], MD;  Location: [place];  Service: Cardiovascular;  Laterality: N/A;   CARDIOVERSION N/A 12/25/2017   Procedure: CARDIOVERSION;  Surgeon: [name], MD;  Location: [facility]COPY;  Service: Cardiovascular;  Laterality: N/A;   CLIPPING OF ATRIAL APPENDAGE N/A 11/14/2014   Procedure: CLIPPING OF ATRIAL APPENDAGE;  Surgeon: [name], MD;  Location: [place];  Service: Open Heart Surgery;  Laterality: N/A;   COLONOSCOPY  2013   COLONOSCOPY WITH PROPOFOL N/A 10/11/2019   Procedure: COLONOSCOPY WITH PROPOFOL;  Surgeon: Otis Brace, MD;  Location: [place];  Service: Gastroenterology;  Laterality:  N/A;   CORONARY ARTERY BYPASS GRAFT N/A 11/14/2014   Procedure: CORONARY ARTERY BYPASS GRAFTING (CABG)TIMES 2 USING LEFT INTERNAL MAMMARY ARTERY AND RIGHT SAPHENOUS VEIN HARVESTED ENDOSCOPICALLY;  Surgeon: [name], MD;  Location: [place];  Service: Open Heart Surgery;  Laterality: N/A;   ESOPHAGOGASTRODUODENOSCOPY (EGD) WITH PROPOFOL N/A 10/10/2019   Procedure: ESOPHAGOGASTRODUODENOSCOPY (EGD) WITH PROPOFOL;  Surgeon: Otis Brace, MD;  Location: [place];  Service: Gastroenterology;  Laterality: N/A;   ESOPHAGOGASTRODUODENOSCOPY (EGD) WITH PROPOFOL N/A 10/27/2019   Procedure: ESOPHAGOGASTRODUODENOSCOPY (EGD) WITH PROPOFOL;  Surgeon: Ronnette Juniper, MD;  Location: [place];  Service: Gastroenterology;  Laterality: N/A;   GIVENS CAPSULE STUDY N/A 10/27/2019   Procedure: GIVENS CAPSULE STUDY;  Surgeon: Ronnette Juniper, MD;  Location: [place];  Service: Gastroenterology;  Laterality: N/A;   LEFT HEART CATHETERIZATION WITH CORONARY ANGIOGRAM N/A 11/04/2014   Procedure: LEFT HEART CATHETERIZATION WITH CORONARY ANGIOGRAM;  Surgeon: Troy Sine, MD;  Location: [facility] CATH LAB;  Service: Cardiovascular;  Laterality: N/A;   PACEMAKER IMPLANT N/A 01/18/2021   Procedure: PACEMAKER IMPLANT;  Surgeon: Evans Lance, MD;  Location: [place] CV LAB;  Service: Cardiovascular;  Laterality: N/A;   PACEMAKER IMPLANT Left    POLYPECTOMY  10/11/2019   Procedure: POLYPECTOMY;  Surgeon: Otis Brace, MD;  Location: [place] ENDOSCOPY;  Service: Gastroenterology;;   TEE WITHOUT CARDIOVERSION N/A 11/14/2014   Procedure: TRANSESOPHAGEAL ECHOCARDIOGRAM (TEE);  Surgeon: [name], MD;  Location: [place];  Service: Open Heart Surgery;  Laterality: N/A;   TEE WITHOUT CARDIOVERSION N/A 12/25/2017   Procedure: TRANSESOPHAGEAL ECHOCARDIOGRAM (TEE);  Surgeon: [name], MD;  Location: [facility] ENDOSCOPY;  Service: Cardiovascular;  Laterality: N/A;   TEMPORARY PACEMAKER INSERTION  11/04/2014    Procedure: TEMPORARY PACEMAKER INSERTION;  Surgeon: Troy Sine, MD;  Location: [facility] CATH LAB;  Service: Cardiovascular;;   TOOTH EXTRACTION      Current Outpatient Medications  Medication Sig Dispense Refill   acetaminophen (TYLENOL) 325 MG tablet Take 2 tablets (650 mg total) by mouth every 4 (four) hours as needed for headache or mild pain.     allopurinol (ZYLOPRIM) 300 MG tablet Take 1 tablet (300 mg total) by mouth daily. 30 tablet 11   atorvastatin (LIPITOR) 10 MG tablet Take 10 mg by mouth at bedtime.     dapagliflozin propanediol (FARXIGA) 10 MG TABS tablet Take 1 tablet (10 mg total) by mouth daily before breakfast. 90 tablet 3   losartan (COZAAR) 25 MG tablet Take 1 tablet (25 mg total) by mouth daily. 90 tablet 3   metFORMIN (GLUCOPHAGE) 500 MG tablet Take 500 mg by mouth daily with breakfast. Daily with a meal     metoprolol succinate (TOPROL-XL) 100 MG 24 hr tablet Take 1 tablet (100 mg total) by mouth 2 (two) times daily. Take with or immediately following a meal. 180 tablet 3   rivaroxaban (XARELTO) 20 MG TABS tablet Take 1 tablet (20 mg total) by mouth daily  with supper. Resume from 11/01/2019     Tiotropium Bromide Monohydrate (SPIRIVA RESPIMAT) 2.5 MCG/ACT AERS Inhale 2 puffs into the lungs daily. 1 g 5   triamcinolone 0.5%-Eucerin equivalent 1:1 cream mixture Apply topically as needed for rash.     vitamin B-12 (CYANOCOBALAMIN) 50 MCG tablet Take 50 mcg by mouth daily. Patient is taking 25 mcg daily.     No current facility-administered medications for this visit.    Allergies:   Bee venom, Ivp dye [iodinated diagnostic agents], Ace inhibitors, Atenolol, Codeine, Entresto [sacubitril-valsartan], Penicillin g, Shrimp [shellfish allergy], Simvastatin, and Jardiance [empagliflozin]   Social History: Social History   Socioeconomic History   Marital status: Widowed    Spouse name: Not on file   Number of children: 1   Years of education: 31   Highest education level: Not  on file  Occupational History   Occupation: RETIRED [employer]  Tobacco Use   Smoking status: Every Day    Packs/day: 0.50    Years: 56.00    Pack years: 28.00    Types: Cigarettes   Smokeless tobacco: Never   Tobacco comments:    smoking 2 cigarettes/day as of 10/23/20  Vaping Use   Vaping Use: Never used  Substance and Sexual Activity   Alcohol use: No    Alcohol/week: 0.0 standard drinks   Drug use: No   Sexual activity: Not on file  Other Topics Concern   Not on file  Social History Narrative   Patient reports it being difficult to pay for everything due to outstanding medical bills from hospital stay last year but states she is able to keep up with basic expenses.  Patient does have some concerns with her house's condition due to a water leak- had her roof repaired last month but has some leaking in the front which has affected her porch.  Patient owns her own car and is able to drive herself but has some concerns about the reliability of her car- has gotten taxi to the clinic for appointment in the past when her car broke down.   Social Determinants of Health   Financial Resource Strain: Not on file  Food Insecurity: Not on file  Transportation Needs: Not on file  Physical Activity: Not on file  Stress: Not on file  Social Connections: Not on file  Intimate Partner Violence: Not on file    Family History: Family History  Problem Relation Age of Onset   Cancer Mother        LYMPHOMA   Heart disease Father    Other Father        TB   CVA Sister    Prostate cancer Brother 63   Diabetes Brother      Review of Systems: All other systems reviewed and are otherwise negative except as noted above.  Physical Exam: Vitals:   03/18/21 1125  BP: 104/72  Pulse: 90  SpO2: 98%  Weight: 187 lb 9.6 oz (85.1 kg)  Height: 5' 7.5" (1.715 m)     GEN- The patient is well appearing, alert and oriented x 3 today.   HEENT: normocephalic, atraumatic; sclera clear,  conjunctiva pink; hearing intact; oropharynx clear; neck supple  Lungs- Clear to ausculation bilaterally, normal work of breathing.  No wheezes, rales, rhonchi Heart- Regular rate and rhythm, no murmurs, rubs or gallops  GI- soft, non-tender, non-distended, bowel sounds present  Extremities- no clubbing or cyanosis. No edema MS- no significant deformity or atrophy Skin- warm and dry,  no rash or lesion; PPM pocket well healed Psych- euthymic mood, full affect Neuro- strength and sensation are intact  PPM Interrogation- reviewed in detail today,  See PACEART report  EKG:  EKG is not ordered today.  Recent Labs: 08/13/2020: TSH 1.246 12/08/2020: ALT 14 02/10/2021: B Natriuretic Peptide 1,207.4; Hemoglobin 14.1; Platelets 147 02/11/2021: Magnesium 2.1 03/11/2021: BUN 36; Creatinine, Ser 1.35; Potassium 3.5; Sodium 142   Wt Readings from Last 3 Encounters:  03/18/21 187 lb 9.6 oz (85.1 kg)  03/11/21 190 lb (86.2 kg)  02/12/21 194 lb 12.8 oz (88.4 kg)     Other studies Reviewed: Additional studies/ records that were reviewed today include: Previous EP office notes, Previous remote checks, Most recent labwork.   Assessment and Plan:  1.  Uncontrolled AF  s/p AV nodal ablation and Biotronik PPM 01/28/2021 Normal PPM function See P[facility] Art report No changes today  2. Chronic combined systolic and diastolic CHF Echo [id number] with LVEF 25-30% GDMT per CHF team who is following closely. Hopefully her EF will improve again not that RVR will not be an issue as well.  3. CAD s/p CABG 2016 Denies anginal symptoms Low risk myoview 08/2018 She had sudden sharp left sided pain earlier this week. While driving and worse with AROM of her arm. She felt like it was a gas bubble due to drinking milk. She took tylenol and went to sleep and it was gone when she woke up from her nap. Denies exertional chest pain. Will alert [place] if any further or change in symptoms.   Current medicines are reviewed at  length with the patient today.   The patient does not have concerns regarding her medicines.  The following changes were made today:  none  Labs/ tests ordered today include:  Orders Placed This Encounter  Procedures   CUP PACEART INCLINIC DEVICE CHECK    Disposition:   Follow up with Dr. Lovena Le as scheduled.    Jacalyn Lefevre, PA-C  03/18/2021 12:17 PM  [place] [address] [phone] (office) [phone] (fax)

## 2021-03-18 ENCOUNTER — Other Ambulatory Visit: Payer: Self-pay

## 2021-03-18 ENCOUNTER — Telehealth (HOSPITAL_COMMUNITY): Payer: Self-pay

## 2021-03-18 ENCOUNTER — Encounter: Payer: Self-pay | Admitting: Student

## 2021-03-18 ENCOUNTER — Ambulatory Visit (INDEPENDENT_AMBULATORY_CARE_PROVIDER_SITE_OTHER): Payer: HMO | Admitting: Student

## 2021-03-18 VITALS — BP 104/72 | HR 90 | Ht 67.5 in | Wt 187.6 lb

## 2021-03-18 DIAGNOSIS — I5042 Chronic combined systolic (congestive) and diastolic (congestive) heart failure: Secondary | ICD-10-CM | POA: Diagnosis not present

## 2021-03-18 DIAGNOSIS — I4589 Other specified conduction disorders: Secondary | ICD-10-CM | POA: Diagnosis not present

## 2021-03-18 DIAGNOSIS — I10 Essential (primary) hypertension: Secondary | ICD-10-CM | POA: Diagnosis not present

## 2021-03-18 DIAGNOSIS — I4819 Other persistent atrial fibrillation: Secondary | ICD-10-CM

## 2021-03-18 DIAGNOSIS — I2510 Atherosclerotic heart disease of native coronary artery without angina pectoris: Secondary | ICD-10-CM

## 2021-03-18 LAB — CUP PACEART INCLINIC DEVICE CHECK
Date Time Interrogation Session: 20220804120911
Implantable Lead Implant Date: 20220606
Implantable Lead Location: 753860
Implantable Lead Model: 377169
Implantable Lead Serial Number: 8000396500
Implantable Pulse Generator Implant Date: 20220606
Lead Channel Impedance Value: 351 Ohm
Lead Channel Pacing Threshold Amplitude: 0.7 V
Lead Channel Pacing Threshold Amplitude: 0.7 V
Lead Channel Pacing Threshold Pulse Width: 0.4 ms
Lead Channel Pacing Threshold Pulse Width: 0.4 ms
Lead Channel Sensing Intrinsic Amplitude: 8.8 mV
Lead Channel Sensing Intrinsic Amplitude: 8.8 mV
Lead Channel Setting Pacing Amplitude: 3 V
Lead Channel Setting Pacing Pulse Width: 0.4 ms
Pulse Gen Model: 407145
Pulse Gen Serial Number: 70105656

## 2021-03-18 NOTE — Patient Instructions (Signed)
Medication Instructions:  Your physician recommends that you continue on your current medications as directed. Please refer to the Current Medication list given to you today.  *If you need a refill on your cardiac medications before your next appointment, please call your pharmacy*   Lab Work: None If you have labs (blood work) drawn today and your tests are completely normal, you will receive your results only by: [place] (if you have MyChart) OR A paper copy in the mail If you have any lab test that is abnormal or we need to change your treatment, we will call you to review the results.   Follow-Up: At [facility], you and your health needs are our priority.  As part of our continuing mission to provide you with exceptional heart care, we have created designated Provider Care Teams.  These Care Teams include your primary Cardiologist (physician) and Advanced Practice Providers (APPs -  Physician Assistants and Nurse Practitioners) who all work together to provide you with the care you need, when you need it.  We recommend signing up for the patient portal called "MyChart".  Sign up information is provided on this After Visit Summary.  MyChart is used to connect with patients for Virtual Visits (Telemedicine).  Patients are able to view lab/test results, encounter notes, upcoming appointments, etc.  Non-urgent messages can be sent to your provider as well.   To learn more about what you can do with MyChart, go to [email or web address].    Your next appointment:   As scheduled

## 2021-03-18 NOTE — Telephone Encounter (Signed)
Attempted to reach [facility] for home visit with no success. I will continue to reach out.

## 2021-03-25 ENCOUNTER — Telehealth (HOSPITAL_COMMUNITY): Payer: Self-pay

## 2021-03-25 NOTE — Telephone Encounter (Signed)
Spoke to [name] [name] who agreed on home visit for next Wednesday. Patient advised she has all her medications currently. Call complete.

## 2021-03-30 ENCOUNTER — Telehealth (HOSPITAL_COMMUNITY): Payer: Self-pay

## 2021-03-30 DIAGNOSIS — E119 Type 2 diabetes mellitus without complications: Secondary | ICD-10-CM | POA: Diagnosis not present

## 2021-03-30 DIAGNOSIS — H2513 Age-related nuclear cataract, bilateral: Secondary | ICD-10-CM | POA: Diagnosis not present

## 2021-03-30 NOTE — Telephone Encounter (Signed)
Left voicemail for Ms. Botos to return my call in reference to setting up a time for home visit tomorrow. I will continue to reach out.

## 2021-04-01 ENCOUNTER — Telehealth (HOSPITAL_COMMUNITY): Payer: Self-pay

## 2021-04-01 NOTE — Telephone Encounter (Signed)
Attempted several times to reach Ms. Hantz on both phone numbers listed for our initial home paramedicine visit with no success. I will continue to try and reach out.    Update: as of 0800 04/01/21 no returned calls from Ms. Maberry. I will continue to reach out.

## 2021-04-07 ENCOUNTER — Inpatient Hospital Stay (HOSPITAL_COMMUNITY)
Admission: RE | Admit: 2021-04-07 | Discharge: 2021-04-07 | Disposition: A | Discharge status: Home / Self Care | Payer: HMO | Source: Ambulatory Visit

## 2021-04-12 ENCOUNTER — Encounter (HOSPITAL_COMMUNITY): Payer: Self-pay | Admitting: Emergency Medicine

## 2021-04-12 ENCOUNTER — Other Ambulatory Visit: Payer: Self-pay

## 2021-04-12 ENCOUNTER — Emergency Department (HOSPITAL_COMMUNITY)
Admission: EM | Admit: 2021-04-12 | Discharge: 2021-04-12 | Disposition: A | Discharge status: Home / Self Care | Payer: HMO | Attending: Emergency Medicine | Admitting: Emergency Medicine

## 2021-04-12 DIAGNOSIS — Z79899 Other long term (current) drug therapy: Secondary | ICD-10-CM | POA: Diagnosis not present

## 2021-04-12 DIAGNOSIS — I130 Hypertensive heart and chronic kidney disease with heart failure and stage 1 through stage 4 chronic kidney disease, or unspecified chronic kidney disease: Secondary | ICD-10-CM | POA: Insufficient documentation

## 2021-04-12 DIAGNOSIS — F17210 Nicotine dependence, cigarettes, uncomplicated: Secondary | ICD-10-CM | POA: Insufficient documentation

## 2021-04-12 DIAGNOSIS — Z7901 Long term (current) use of anticoagulants: Secondary | ICD-10-CM | POA: Diagnosis not present

## 2021-04-12 DIAGNOSIS — I5043 Acute on chronic combined systolic (congestive) and diastolic (congestive) heart failure: Secondary | ICD-10-CM | POA: Diagnosis not present

## 2021-04-12 DIAGNOSIS — I2510 Atherosclerotic heart disease of native coronary artery without angina pectoris: Secondary | ICD-10-CM | POA: Diagnosis not present

## 2021-04-12 DIAGNOSIS — Z951 Presence of aortocoronary bypass graft: Secondary | ICD-10-CM | POA: Diagnosis not present

## 2021-04-12 DIAGNOSIS — Z950 Presence of cardiac pacemaker: Secondary | ICD-10-CM | POA: Insufficient documentation

## 2021-04-12 DIAGNOSIS — B379 Candidiasis, unspecified: Secondary | ICD-10-CM | POA: Diagnosis not present

## 2021-04-12 DIAGNOSIS — J449 Chronic obstructive pulmonary disease, unspecified: Secondary | ICD-10-CM | POA: Insufficient documentation

## 2021-04-12 DIAGNOSIS — R21 Rash and other nonspecific skin eruption: Secondary | ICD-10-CM | POA: Diagnosis present

## 2021-04-12 DIAGNOSIS — N183 Chronic kidney disease, stage 3 unspecified: Secondary | ICD-10-CM | POA: Diagnosis not present

## 2021-04-12 DIAGNOSIS — B372 Candidiasis of skin and nail: Secondary | ICD-10-CM | POA: Diagnosis not present

## 2021-04-12 DIAGNOSIS — J45909 Unspecified asthma, uncomplicated: Secondary | ICD-10-CM | POA: Insufficient documentation

## 2021-04-12 DIAGNOSIS — I4819 Other persistent atrial fibrillation: Secondary | ICD-10-CM | POA: Diagnosis not present

## 2021-04-12 DIAGNOSIS — Z7984 Long term (current) use of oral hypoglycemic drugs: Secondary | ICD-10-CM | POA: Diagnosis not present

## 2021-04-12 MED ORDER — NYSTATIN 100000 UNIT/GM EX CREA: TOPICAL_CREAM | CUTANEOUS | 0 refills | Status: DC

## 2021-04-12 NOTE — ED Triage Notes (Signed)
Patient coming from home. Pt had pacemaker placed, now with yeast infection to both breast.

## 2021-04-12 NOTE — Discharge Instructions (Addendum)
You have been diagnosed with a fungal infection involving the skin on your chest.  Please apply nystatin cream twice daily to affected area for the next 3 to 4 weeks as treatment.  Do not apply triamcinolone cream to the affected area as it may worsen your rash.  Follow-up with your doctor for a recheck.

## 2021-04-12 NOTE — ED Notes (Signed)
Patient discharge instructions reviewed with the patient. The patient verbalized understanding of instructions. Patient discharged. 

## 2021-04-12 NOTE — ED Provider Notes (Signed)
[facility] EMERGENCY DEPARTMENT Provider Note   CSN: [id number] Arrival date & time: 04/12/21  1200     History Chief Complaint  Patient presents with   Recurrent Skin Infections    [name] [name] is a 75 y.o. female.  The history is provided by the patient and medical records. No language interpreter was used.   75 year old female significant history of atopic dermatitis, hypertension, cardiac disease, CHF, who presents complaining of a rash.  Patient reports she has a very itchy and burning rash underneath both of her breasts ongoing for the past several days.  She was seen for this and was prescribed triamcinolone cream which she has been taking as prescribed but noticed no improvement.  Due to worsening of her symptoms she presents ED.  She does not complain of any associated fever or worsening shortness of breath.  No active chest pain.  She recently had a pacemaker placed.  She denies any recent antibiotic use.  Past Medical History:  Diagnosis Date   ([place]) heart failure with improved ejection fraction ([place])    a. 07/2018 Echo: EF 30-35%; b. 11/2019 Echo: EF 55-60%, no rwma, Gr2 DD, Nl RV size/fxn. Mild BAE. Mild MR/AI.   Arthritis    Asthma    Atopic dermatitis    CAD (coronary artery disease)    a. 2016 s/p CABG x 2 (LIMA->LAD, VG->OM); b. 08/2018 MV: EF 44%, no ischemia/infact.   Cardiac arrest ([place]) 10/2014   Cardiomyopathy, ischemic    a. 07/2018 Echo: EF 30-35%; 11/2019 Echo: EF 55-60%.   Carotid arterial disease ([place])    a. 11/2019 Carotid U/S   CKD (chronic kidney disease), stage III (HCC)    COPD (chronic obstructive pulmonary disease) ([place])    Esophageal dilatation 2013   GERD (gastroesophageal reflux disease)    Gout    Headache    Hypertension    Hypokalemia    Idiopathic angioedema    LGI bleed 08/06/2017   a. felt to be hemorrhoidal during that admission (no drop in Hgb).   Lower back pain    Paroxysmal atrial fibrillation (HCC)     a. Dx 2016-->h/o difficult to control rates (complicated by noncompliance), not felt to be a candidate for ablation or antiarrhythmic due to noncompliance; b. Recurrent AF 2021 - converted w/ IV dilt; c. CHA2DS2VASc = 7-->Xarelto.   Personal history of noncompliance with medical treatment, presenting hazards to health    Prediabetes    S/P CABG x 2 with clipping of LA appendage 11/14/2014   LIMA to LAD, SVG to OM, EVH via right thigh   Urine incontinence    Uterine fibroid     Patient Active Problem List   Diagnosis Date Noted   Elevated troponin 02/10/2021   Acute respiratory failure with hypoxia ([place]) 12/08/2020   Hypotension 12/08/2020   Hypoalbuminemia 12/08/2020   Obstructive sleep apnea 09/21/2020   Atrial fibrillation with rapid ventricular response ([place]) 08/13/2020   Acute lower GI bleeding 02/10/2020   Tobacco dependence 02/10/2020   Obesity (BMI 30.0-34.9) 02/10/2020   Acute upper GI bleeding 10/26/2019   COPD (chronic obstructive pulmonary disease) ([place]) 10/26/2019   Acute GI bleeding 10/09/2019   Acute blood loss anemia 10/09/2019   Postmenopausal vaginal bleeding 01/04/2018   Snoring 01/04/2018   Acute on chronic combined systolic and diastolic CHF (congestive heart failure) ([place])    CHF exacerbation ([place]) 10/16/2017   Noncompliance 10/16/2017   Acute on chronic congestive heart failure ([place])  Thrombocytopenia ([place]) 09/14/2017   Acute drug-induced gout of right foot    Medication noncompliance due to cognitive impairment [id number]   Acute diastolic CHF (congestive heart failure) ([place]) 08/06/2017   LGI bleed, likely hemorrhoids 08/06/2017   Persistent atrial fibrillation ([place]) 08/04/2017   Paroxysmal atrial fibrillation ([place]) 02/08/2017   Cardiomyopathy, ischemic 02/08/2017   Dysphagia 02/08/2017   CKD (chronic kidney disease), stage III ([place]) 01/30/2017   Pain in shoulder 02/08/2016   Breast pain, left 02/08/2016   Bilateral arm numbness and tingling while  sleeping 02/08/2016   Painful lumpy left breast 09/23/2015   Candidal intertrigo 02/11/2015   S/P CABG x 2 11/14/2014   Accelerated hypertension    Cardiac arrest ([place]) 11/04/2014   Left main coronary artery disease 11/04/2014   Coronary artery disease due to lipid rich plaque    Acute respiratory failure with hypoxemia [facility])    Essential hypertension    Atrial fibrillation with RVR (HCC)    Hypokalemia 10/30/2014   Chronic combined systolic and diastolic CHF (congestive heart failure) ([place]) 10/30/2014   NSTEMI (non-ST elevated myocardial infarction) ([place])    DOE (dyspnea on exertion)    CAP (community acquired pneumonia) 10/29/2014    Past Surgical History:  Procedure Laterality Date   AV NODE ABLATION N/A 01/18/2021   Procedure: AV NODE ABLATION;  Surgeon: Evans Lance, MD;  Location: [place] CV LAB;  Service: Cardiovascular;  Laterality: N/A;   BALLOON DILATION N/A 10/10/2019   Procedure: BALLOON DILATION;  Surgeon: Otis Brace, MD;  Location: [facility] ENDOSCOPY;  Service: Gastroenterology;  Laterality: N/A;   BIOPSY  10/10/2019   Procedure: BIOPSY;  Surgeon: Otis Brace, MD;  Location: [place];  Service: Gastroenterology;;   BIOPSY  10/11/2019   Procedure: BIOPSY;  Surgeon: Otis Brace, MD;  Location: [place];  Service: Gastroenterology;;   CARDIOVERSION N/A 11/18/2014   Procedure: CARDIOVERSION;  Surgeon: [name], MD;  Location: [place];  Service: Cardiovascular;  Laterality: N/A;   CARDIOVERSION N/A 12/25/2017   Procedure: CARDIOVERSION;  Surgeon: [name], MD;  Location: [place];  Service: Cardiovascular;  Laterality: N/A;   CLIPPING OF ATRIAL APPENDAGE N/A 11/14/2014   Procedure: CLIPPING OF ATRIAL APPENDAGE;  Surgeon: [name], MD;  Location: [place];  Service: Open Heart Surgery;  Laterality: N/A;   COLONOSCOPY  2013   COLONOSCOPY WITH PROPOFOL N/A 10/11/2019   Procedure: COLONOSCOPY WITH PROPOFOL;  Surgeon:  Otis Brace, MD;  Location: [place];  Service: Gastroenterology;  Laterality: N/A;   CORONARY ARTERY BYPASS GRAFT N/A 11/14/2014   Procedure: CORONARY ARTERY BYPASS GRAFTING (CABG)TIMES 2 USING LEFT INTERNAL MAMMARY ARTERY AND RIGHT SAPHENOUS VEIN HARVESTED ENDOSCOPICALLY;  Surgeon: [name], MD;  Location: [place];  Service: Open Heart Surgery;  Laterality: N/A;   ESOPHAGOGASTRODUODENOSCOPY (EGD) WITH PROPOFOL N/A 10/10/2019   Procedure: ESOPHAGOGASTRODUODENOSCOPY (EGD) WITH PROPOFOL;  Surgeon: Otis Brace, MD;  Location: [place];  Service: Gastroenterology;  Laterality: N/A;   ESOPHAGOGASTRODUODENOSCOPY (EGD) WITH PROPOFOL N/A 10/27/2019   Procedure: ESOPHAGOGASTRODUODENOSCOPY (EGD) WITH PROPOFOL;  Surgeon: Ronnette Juniper, MD;  Location: [place];  Service: Gastroenterology;  Laterality: N/A;   GIVENS CAPSULE STUDY N/A 10/27/2019   Procedure: GIVENS CAPSULE STUDY;  Surgeon: Ronnette Juniper, MD;  Location: [place];  Service: Gastroenterology;  Laterality: N/A;   LEFT HEART CATHETERIZATION WITH CORONARY ANGIOGRAM N/A 11/04/2014   Procedure: LEFT HEART CATHETERIZATION WITH CORONARY ANGIOGRAM;  Surgeon: Troy Sine, MD;  Location[facility] CATH LAB;  Service: Cardiovascular;  Laterality:  N/A;   PACEMAKER IMPLANT N/A 01/18/2021   Procedure: PACEMAKER IMPLANT;  Surgeon: Evans Lance, MD;  Location: [place] CV LAB;  Service: Cardiovascular;  Laterality: N/A;   PACEMAKER IMPLANT Left    POLYPECTOMY  10/11/2019   Procedure: POLYPECTOMY;  Surgeon: Otis Brace, MD;  Location: [place] ENDOSCOPY;  Service: Gastroenterology;;   TEE WITHOUT CARDIOVERSION N/A 11/14/2014   Procedure: TRANSESOPHAGEAL ECHOCARDIOGRAM (TEE);  Surgeon: [name], MD;  Location: [place];  Service: Open Heart Surgery;  Laterality: N/A;   TEE WITHOUT CARDIOVERSION N/A 12/25/2017   Procedure: TRANSESOPHAGEAL ECHOCARDIOGRAM (TEE);  Surgeon: [name], MD;  Location: [facility] ENDOSCOPY;  Service:  Cardiovascular;  Laterality: N/A;   TEMPORARY PACEMAKER INSERTION  11/04/2014   Procedure: TEMPORARY PACEMAKER INSERTION;  Surgeon: Troy Sine, MD;  Location: [facility] CATH LAB;  Service: Cardiovascular;;   TOOTH EXTRACTION       OB History   No obstetric history on file.     Family History  Problem Relation Age of Onset   Cancer Mother        LYMPHOMA   Heart disease Father    Other Father        TB   CVA Sister    Prostate cancer Brother 89   Diabetes Brother     Social History   Tobacco Use   Smoking status: Every Day    Packs/day: 0.50    Years: 56.00    Pack years: 28.00    Types: Cigarettes   Smokeless tobacco: Never   Tobacco comments:    smoking 2 cigarettes/day as of 10/23/20  Vaping Use   Vaping Use: Never used  Substance Use Topics   Alcohol use: No    Alcohol/week: 0.0 standard drinks   Drug use: No    Home Medications Prior to Admission medications   Medication Sig Start Date End Date Taking? Authorizing Provider  acetaminophen (TYLENOL) 325 MG tablet Take 2 tablets (650 mg total) by mouth every 4 (four) hours as needed for headache or mild pain. 12/09/20   Mikhail, Velta Addison, DO  allopurinol (ZYLOPRIM) 300 MG tablet Take 1 tablet (300 mg total) by mouth daily. 01/04/18   Georgiana Shore, NP  atorvastatin (LIPITOR) 10 MG tablet Take 10 mg by mouth at bedtime. 01/22/21   [provider]  dapagliflozin propanediol (FARXIGA) 10 MG TABS tablet Take 1 tablet (10 mg total) by mouth daily before breakfast. 03/12/21   Clegg, Amy D, NP  losartan (COZAAR) 25 MG tablet Take 1 tablet (25 mg total) by mouth daily. 10/19/20 03/11/22  Baldwin Jamaica, PA-C  metFORMIN (GLUCOPHAGE) 500 MG tablet Take 500 mg by mouth daily with breakfast. Daily with a meal    [provider]  metoprolol succinate (TOPROL-XL) 100 MG 24 hr tablet Take 1 tablet (100 mg total) by mouth 2 (two) times daily. Take with or immediately following a meal. 02/04/21 05/05/21  Evans Lance,  MD  rivaroxaban (XARELTO) 20 MG TABS tablet Take 1 tablet (20 mg total) by mouth daily with supper. Resume from 11/01/2019 11/01/19   Aline August, MD  Tiotropium Bromide Monohydrate (SPIRIVA RESPIMAT) 2.5 MCG/ACT AERS Inhale 2 puffs into the lungs daily. 10/23/20   Parrett, Fonnie Mu, NP  triamcinolone 0.5%-Eucerin equivalent 1:1 cream mixture Apply topically as needed for rash.    [provider]  vitamin B-12 (CYANOCOBALAMIN) 50 MCG tablet Take 50 mcg by mouth daily. Patient is taking 25 mcg daily.    [provider]  Allergies    Bee venom, Ivp dye [iodinated diagnostic agents], Ace inhibitors, Atenolol, Codeine, Entresto [sacubitril-valsartan], Penicillin g, Shrimp [shellfish allergy], Simvastatin, and Jardiance [empagliflozin]  Review of Systems   Review of Systems  Constitutional:  Negative for fever.  Skin:  Positive for rash.   Physical Exam Updated Vital Signs BP (!) 173/111 (BP Location: Left Arm)   Pulse 80   Temp 98.9 F (37.2 C)   Resp 16   SpO2 98%   Physical Exam Vitals and nursing note reviewed.  Constitutional:      General: She is not in acute distress.    Appearance: She is well-developed.  HENT:     Head: Atraumatic.  Eyes:     Conjunctiva/sclera: Conjunctivae normal.  Pulmonary:     Effort: Pulmonary effort is normal.  Chest:     Chest wall: Tenderness (Chaperone present during exam.  Significant macerated skin changes noted to the inferior breast folds bilaterally consistent with candidiasis.) present.  Abdominal:     Palpations: Abdomen is soft.  Musculoskeletal:     Cervical back: Neck supple.  Skin:    Findings: Rash present.  Neurological:     Mental Status: She is alert.  Psychiatric:        Mood and Affect: Mood normal.    ED Results / Procedures / Treatments   Labs (all labs ordered are listed, but only abnormal results are displayed) Labs Reviewed - No data to display  EKG None  Radiology No results  found.  Procedures Procedures   Medications Ordered in ED Medications - No data to display  ED Course  I have reviewed the triage vital signs and the nursing notes.  Pertinent labs & imaging results that were available during my care of the patient were reviewed by me and considered in my medical decision making (see chart for details).    MDM Rules/Calculators/A&P                           BP (!) 173/111 (BP Location: Left Arm)   Pulse 80   Temp 98.9 F (37.2 C)   Resp 16   SpO2 98%   Final Clinical Impression(s) / ED Diagnoses Final diagnoses:  Candida infection    Rx / DC Orders ED Discharge Orders          Ordered    nystatin cream (MYCOSTATIN)        04/12/21 1317           1:03 PM Patient here with candidal rash in between the breast folds bilaterally ongoing for the past several days.  Reportedly recently prescribed triamcinolone for her symptoms.  I recommend patient to discontinue this medication as it would likely worsen her rash.  Will prescribe nystatin cream to use and encouraged patient to follow-up with PCP for recheck.  Care discussed with Dr. Doren Custard.  I was able to reach out to patient's legal guardian, her daughter, [name] [name] who is made aware of the rash and agrees with treatment.  Patient otherwise stable for discharge.   Domenic Moras, PA-C 04/12/21 1318    Godfrey Pick, MD 04/14/21 [phone]

## 2021-04-14 ENCOUNTER — Telehealth (HOSPITAL_COMMUNITY): Payer: Self-pay

## 2021-04-14 DIAGNOSIS — I2510 Atherosclerotic heart disease of native coronary artery without angina pectoris: Secondary | ICD-10-CM | POA: Diagnosis not present

## 2021-04-14 DIAGNOSIS — J449 Chronic obstructive pulmonary disease, unspecified: Secondary | ICD-10-CM | POA: Diagnosis not present

## 2021-04-14 DIAGNOSIS — I214 Non-ST elevation (NSTEMI) myocardial infarction: Secondary | ICD-10-CM | POA: Diagnosis not present

## 2021-04-14 DIAGNOSIS — K219 Gastro-esophageal reflux disease without esophagitis: Secondary | ICD-10-CM | POA: Diagnosis not present

## 2021-04-14 DIAGNOSIS — M8580 Other specified disorders of bone density and structure, unspecified site: Secondary | ICD-10-CM | POA: Diagnosis not present

## 2021-04-14 DIAGNOSIS — E1122 Type 2 diabetes mellitus with diabetic chronic kidney disease: Secondary | ICD-10-CM | POA: Diagnosis not present

## 2021-04-14 DIAGNOSIS — I10 Essential (primary) hypertension: Secondary | ICD-10-CM | POA: Diagnosis not present

## 2021-04-14 DIAGNOSIS — I4891 Unspecified atrial fibrillation: Secondary | ICD-10-CM | POA: Diagnosis not present

## 2021-04-14 DIAGNOSIS — M1990 Unspecified osteoarthritis, unspecified site: Secondary | ICD-10-CM | POA: Diagnosis not present

## 2021-04-14 DIAGNOSIS — N183 Chronic kidney disease, stage 3 unspecified: Secondary | ICD-10-CM | POA: Diagnosis not present

## 2021-04-14 DIAGNOSIS — E7800 Pure hypercholesterolemia, unspecified: Secondary | ICD-10-CM | POA: Diagnosis not present

## 2021-04-14 DIAGNOSIS — I509 Heart failure, unspecified: Secondary | ICD-10-CM | POA: Diagnosis not present

## 2021-04-14 NOTE — Telephone Encounter (Signed)
Attempted to reach [name] [name] without success to set up home visit. Message left, I will continue to reach out.

## 2021-04-15 ENCOUNTER — Telehealth (HOSPITAL_COMMUNITY): Payer: Self-pay

## 2021-04-15 NOTE — Telephone Encounter (Signed)
Attempted to reach [place] on both her home and mobile phones today without success to set up paramedicine visit. Messages left. I will continue trying to reach out.

## 2021-04-16 DIAGNOSIS — B372 Candidiasis of skin and nail: Secondary | ICD-10-CM | POA: Diagnosis not present

## 2021-04-20 ENCOUNTER — Telehealth (HOSPITAL_COMMUNITY): Payer: Self-pay

## 2021-04-20 NOTE — Telephone Encounter (Signed)
Left message in attempt to reach [facility] for scheduling paramedicine visit. No success, I will continue to reach out.

## 2021-04-22 NOTE — Progress Notes (Signed)
PCP: Dr Lysle Rubens  Primary Cardiologist: [name]  EP: Dr Lovena Le HF MD: Dr Haroldine Laws   HPI: [name] [name] is a 75 y.o. female with h/o HTN, former tobacco abuse, permanent AF, CAD s/p CABG x 2 (LIMA to LAD and SVG to OM) in 2016, Chronic combined CHF, Ischemic CMP, HLD, and Biotronik PPM.   She was initially noted to be in atrial fibrillation in 2016 with a reduced LVEF, leading to ischemic workup with a cardiac cath showing severe multivessel CAD with LM involvement in which she underwent CABG x2 utilizing LIMA to LAD, SVG to OM.   Admitted 1/19 for AF with RVR in setting of Influenza A. EP was consulted to discuss plans for Tikosyn vs. ablation vs. medication rate control however, due to her known medication noncompliance she was found to not be an ablation nor antiarrhythmic candidate   Admitted 10/2017  with recurrent Afib RVR and ADHF. Started on diltiazem. Echo repeated which showed fall in EF from previous. Diuresed with IV lasix. Discharged home on diltiazem 360, Torpol 100, digoxin and Xarelto.   2019 back in  Afib. She was started on amiodarone and diltiazem was DCd. She was also started on Entresto. She underwent successful atrial flutter TEE/DCCV on 12/25/17.  Later went back in A fib. Toprol DC'd with ongoing bradycardia. She later placed on low dose carvedilol.   Intolerant entresto due to chest pain.    Admitted 3/21 for iron-def anemia. Hemoglobin was 5.4.  She was started on IV Protonix.  GI was consulted.  Underwent EGD/colonoscopy which were unremarkable and capsule endoscopy which showed duodenal ulcer with bleeding and stigmata.  GI recommended oral Protonix and outpatient follow-up.  Admitted 01/18/21 for AV node ablation and PPM due to uncontrolled heart rates with A fib. Underwent Biotronix PPM. Toprol XL and digoxin stopped. Carvedilol 25 mg twice a day started. She was discharged 01/19/21.   She called Dr Forde Dandy office 02/04/21 due to headache after starting carvedilol.  Carvedilol was stopped and she was switched back to Toprol XL.    Admitted 0000000 with a/c systolic heart failure. Diuresed with IV lasix and transitioned to torsemide 40 mg daily. Echo completed and showed EF 25-30% with severe MR/TR.    Today she returns for HF follow up. Her main complaint is rash under her breasts that has persisted for several weeks. She thinks it started after her PPM insertion and wonders if she is allergic to the materials in the PPM. Treated by PCP and ED provider for this with little improvement. She is not SOB with walking on flat ground, but feels a little light-headed today. Denies CP, bleeding issues, edema, or PND/Orthopnea. Appetite ok. No fever or chills. She does not know what medications she is taking and she did not bring a list or pill bottles with her today. She does not weigh at home but has a scale. Smoking 2 cigarettes a day.  Cardiac Studies   Echo 3/16 EF 35-40%. Echo 10/17 EF improved to 50-55%. Echo 5/18 EF stable 50-55% Echo 3/19 LVEF 30-35%, Moderate LVH, Mild AI, Moderate regurgitation, Severe LAE, Moderately reduced RV, Severe RAE, PA peak pressure 50 mm Hg.   (Decreased from Echo 01/11/2017 with LVEF 50-55%) Echo 12/19 Ef 30-35% RV mildly dilated. Echo 4/21 EF 55-60%  Echo 11/21 EF 30-35% RV normal  Echo 4/22 EF 25-30% RV moderately reduced  Echo 6/22 EF 25-30% RV severely reduced  Stress Test 1/20- Low Risk EF 44%  ROS: All systems negative  except as listed in HPI, PMH and Problem List.  SH:  Social History   Socioeconomic History   Marital status: Widowed    Spouse name: Not on file   Number of children: 1   Years of education: 68   Highest education level: Not on file  Occupational History   Occupation: RETIRED [employer]  Tobacco Use   Smoking status: Every Day    Packs/day: 0.50    Years: 56.00    Pack years: 28.00    Types: Cigarettes   Smokeless tobacco: Never   Tobacco comments:    smoking 2 cigarettes/day as  of 10/23/20  Vaping Use   Vaping Use: Never used  Substance and Sexual Activity   Alcohol use: No    Alcohol/week: 0.0 standard drinks   Drug use: No   Sexual activity: Not on file  Other Topics Concern   Not on file  Social History Narrative   Patient reports it being difficult to pay for everything due to outstanding medical bills from hospital stay last year but states she is able to keep up with basic expenses.  Patient does have some concerns with her house's condition due to a water leak- had her roof repaired last month but has some leaking in the front which has affected her porch.  Patient owns her own car and is able to drive herself but has some concerns about the reliability of her car- has gotten taxi to the clinic for appointment in the past when her car broke down.   Social Determinants of Health   Financial Resource Strain: Not on file  Food Insecurity: Not on file  Transportation Needs: Not on file  Physical Activity: Not on file  Stress: Not on file  Social Connections: Not on file  Intimate Partner Violence: Not on file   FH:  Family History  Problem Relation Age of Onset   Cancer Mother        LYMPHOMA   Heart disease Father    Other Father        TB   CVA Sister    Prostate cancer Brother 60   Diabetes Brother    Past Medical History:  Diagnosis Date   ([place]) heart failure with improved ejection fraction ([place])    a. 07/2018 Echo: EF 30-35%; b. 11/2019 Echo: EF 55-60%, no rwma, Gr2 DD, Nl RV size/fxn. Mild BAE. Mild MR/AI.   Arthritis    Asthma    Atopic dermatitis    CAD (coronary artery disease)    a. 2016 s/p CABG x 2 (LIMA->LAD, VG->OM); b. 08/2018 MV: EF 44%, no ischemia/infact.   Cardiac arrest ([place]) 10/2014   Cardiomyopathy, ischemic    a. 07/2018 Echo: EF 30-35%; 11/2019 Echo: EF 55-60%.   Carotid arterial disease ([place])    a. 11/2019 Carotid U/S   CKD (chronic kidney disease), stage III (HCC)    COPD (chronic obstructive pulmonary disease)  ([place])    Esophageal dilatation 2013   GERD (gastroesophageal reflux disease)    Gout    Headache    Hypertension    Hypokalemia    Idiopathic angioedema    LGI bleed 08/06/2017   a. felt to be hemorrhoidal during that admission (no drop in Hgb).   Lower back pain    Paroxysmal atrial fibrillation (HCC)    a. Dx 2016-->h/o difficult to control rates (complicated by noncompliance), not felt to be a candidate for ablation or antiarrhythmic due to noncompliance; b. Recurrent AF 2021 -  converted w/ IV dilt; c. CHA2DS2VASc = 7-->Xarelto.   Personal history of noncompliance with medical treatment, presenting hazards to health    Prediabetes    S/P CABG x 2 with clipping of LA appendage 11/14/2014   LIMA to LAD, SVG to OM, EVH via right thigh   Urine incontinence    Uterine fibroid    Current Outpatient Medications  Medication Sig Dispense Refill   acetaminophen (TYLENOL) 325 MG tablet Take 2 tablets (650 mg total) by mouth every 4 (four) hours as needed for headache or mild pain.     allopurinol (ZYLOPRIM) 300 MG tablet Take 1 tablet (300 mg total) by mouth daily. 30 tablet 11   atorvastatin (LIPITOR) 10 MG tablet Take 10 mg by mouth at bedtime.     dapagliflozin propanediol (FARXIGA) 10 MG TABS tablet Take 1 tablet (10 mg total) by mouth daily before breakfast. 90 tablet 3   losartan (COZAAR) 25 MG tablet Take 1 tablet (25 mg total) by mouth daily. 90 tablet 3   metFORMIN (GLUCOPHAGE) 500 MG tablet Take 500 mg by mouth daily with breakfast. Daily with a meal     metoprolol succinate (TOPROL-XL) 100 MG 24 hr tablet Take 1 tablet (100 mg total) by mouth 2 (two) times daily. Take with or immediately following a meal. 180 tablet 3   nystatin cream (MYCOSTATIN) Apply to rash below breasts 2 times daily x 3 weeks (Patient taking differently: Apply to rash below breasts 2 times daily x 3 weeks as needed) 150 g 0   rivaroxaban (XARELTO) 20 MG TABS tablet Take 1 tablet (20 mg total) by mouth daily  with supper. Resume from 11/01/2019     Tiotropium Bromide Monohydrate (SPIRIVA RESPIMAT) 2.5 MCG/ACT AERS Inhale 2 puffs into the lungs daily. 1 g 5   No current facility-administered medications for this encounter.   BP 122/74   Pulse 87   Wt 86.1 kg (189 lb 12.8 oz)   SpO2 98%   BMI 29.29 kg/m   Wt Readings from Last 3 Encounters:  04/23/21 86.1 kg (189 lb 12.8 oz)  03/18/21 85.1 kg (187 lb 9.6 oz)  03/11/21 86.2 kg (190 lb)   PHYSICAL EXAM: General:  NAD. No resp difficulty HEENT: Normal Neck: Supple. No JVD. Carotids 2+ bilat; no bruits. No lymphadenopathy or thryomegaly appreciated. Cor: PMI nondisplaced. Irregular rate & rhythm. No rubs, gallops or murmurs. Lungs: Clear Skin: Significantly macerated and tender skin to bilateral breast folds with darkened skin changes, broken skin noted under L breast extending to L axilla; appear consistent with candidiasis. Abdomen: Soft, nontender, nondistended. No hepatosplenomegaly. No bruits or masses. Good bowel sounds. Extremities: No cyanosis, clubbing, rash, edema Neuro: Alert & oriented x 3, cranial nerves grossly intact. Moves all 4 extremities w/o difficulty. Affect pleasant.  ASSESSMENT & PLAN:  Chronic Combined /Diastolic Systolic HF: - Suspect etiology is combined Ischemic/NICM with AF - EF back in 2021 had improved to 55-60% but back down  - NYHA II. Volume status ok today.  - Stop [place]ade with significant candidal infection to breast folds. - Continue torsemide 40 mg daily. - Continue Toprol XL 100 mg bid (intolerant to Coreg due to HA). - Continue losartan 25 mg daily (Intolerant Entresto).  - Consider restarting spiro next visit. Will start after paramedicine makes contact. - BMET today. - Plan to repeat ECHO after HF meds optimized.     2. Permanent A fib - S/P AV node ablation and PPM implant.  - On Xarelto. No  bleeding issues. - CBC today.  3. CAD - s/p CABG x 2 2016 (LIMA to LAD and SVG to OM) - Stress  Test 08/2018 low risk. EF 44%.   - No s/s ischemia. - Continue statin.  4. HTN  - Stable.   5. CKD Stage III - Labs today.  6. Tobacco Abuse - Discussed smoking cessation.  - She is not ready to quit.  7. Candida - Stop SGLT2i. - Try Nystatin powder 2-3x/day under breasts. - Keep area clean and dry; use mild/unscented soap when bathing. Pat dry area. - Refer to Dermatology.  She has been referred to Paramedicine however they have been unable to make contact with patient. I verified her phone # is correct in system and encouraged her to answer phone when they call.  Follow up with APP in 2-3 weeks. Consider adding spiro once paramedicine is on board.  Maricela Bo Takeya Marquis FNP-BC 11:24 AM

## 2021-04-23 ENCOUNTER — Ambulatory Visit (HOSPITAL_COMMUNITY)
Admission: RE | Admit: 2021-04-23 | Discharge: 2021-04-23 | Disposition: A | Discharge status: Home / Self Care | Payer: HMO | Source: Ambulatory Visit | Attending: Family Medicine | Admitting: Family Medicine

## 2021-04-23 ENCOUNTER — Other Ambulatory Visit: Payer: Self-pay

## 2021-04-23 ENCOUNTER — Encounter (HOSPITAL_COMMUNITY): Payer: Self-pay

## 2021-04-23 VITALS — BP 122/74 | HR 87 | Wt 189.8 lb

## 2021-04-23 DIAGNOSIS — I10 Essential (primary) hypertension: Secondary | ICD-10-CM | POA: Diagnosis not present

## 2021-04-23 DIAGNOSIS — Z733 Stress, not elsewhere classified: Secondary | ICD-10-CM | POA: Insufficient documentation

## 2021-04-23 DIAGNOSIS — Z7984 Long term (current) use of oral hypoglycemic drugs: Secondary | ICD-10-CM | POA: Diagnosis not present

## 2021-04-23 DIAGNOSIS — I130 Hypertensive heart and chronic kidney disease with heart failure and stage 1 through stage 4 chronic kidney disease, or unspecified chronic kidney disease: Secondary | ICD-10-CM | POA: Diagnosis not present

## 2021-04-23 DIAGNOSIS — Z79899 Other long term (current) drug therapy: Secondary | ICD-10-CM | POA: Diagnosis not present

## 2021-04-23 DIAGNOSIS — F17210 Nicotine dependence, cigarettes, uncomplicated: Secondary | ICD-10-CM | POA: Insufficient documentation

## 2021-04-23 DIAGNOSIS — B372 Candidiasis of skin and nail: Secondary | ICD-10-CM

## 2021-04-23 DIAGNOSIS — E785 Hyperlipidemia, unspecified: Secondary | ICD-10-CM | POA: Diagnosis not present

## 2021-04-23 DIAGNOSIS — Z951 Presence of aortocoronary bypass graft: Secondary | ICD-10-CM | POA: Diagnosis not present

## 2021-04-23 DIAGNOSIS — Z7901 Long term (current) use of anticoagulants: Secondary | ICD-10-CM | POA: Insufficient documentation

## 2021-04-23 DIAGNOSIS — Z8674 Personal history of sudden cardiac arrest: Secondary | ICD-10-CM | POA: Diagnosis not present

## 2021-04-23 DIAGNOSIS — Z720 Tobacco use: Secondary | ICD-10-CM | POA: Diagnosis not present

## 2021-04-23 DIAGNOSIS — N1832 Chronic kidney disease, stage 3b: Secondary | ICD-10-CM

## 2021-04-23 DIAGNOSIS — N183 Chronic kidney disease, stage 3 unspecified: Secondary | ICD-10-CM | POA: Diagnosis not present

## 2021-04-23 DIAGNOSIS — I4821 Permanent atrial fibrillation: Secondary | ICD-10-CM | POA: Insufficient documentation

## 2021-04-23 DIAGNOSIS — I4819 Other persistent atrial fibrillation: Secondary | ICD-10-CM | POA: Diagnosis not present

## 2021-04-23 DIAGNOSIS — I2510 Atherosclerotic heart disease of native coronary artery without angina pectoris: Secondary | ICD-10-CM

## 2021-04-23 DIAGNOSIS — I5042 Chronic combined systolic (congestive) and diastolic (congestive) heart failure: Secondary | ICD-10-CM | POA: Diagnosis not present

## 2021-04-23 DIAGNOSIS — B3789 Other sites of candidiasis: Secondary | ICD-10-CM | POA: Diagnosis not present

## 2021-04-23 DIAGNOSIS — Z950 Presence of cardiac pacemaker: Secondary | ICD-10-CM | POA: Insufficient documentation

## 2021-04-23 DIAGNOSIS — Z8249 Family history of ischemic heart disease and other diseases of the circulatory system: Secondary | ICD-10-CM | POA: Diagnosis not present

## 2021-04-23 DIAGNOSIS — R42 Dizziness and giddiness: Secondary | ICD-10-CM | POA: Insufficient documentation

## 2021-04-23 DIAGNOSIS — Z5989 Other problems related to housing and economic circumstances: Secondary | ICD-10-CM | POA: Diagnosis not present

## 2021-04-23 DIAGNOSIS — I255 Ischemic cardiomyopathy: Secondary | ICD-10-CM | POA: Diagnosis not present

## 2021-04-23 LAB — BASIC METABOLIC PANEL
Anion gap: 10 (ref 5–15)
BUN: 27 mg/dL — ABNORMAL HIGH (ref 8–23)
CO2: 20 mmol/L — ABNORMAL LOW (ref 22–32)
Calcium: 9.2 mg/dL (ref 8.9–10.3)
Chloride: 111 mmol/L (ref 98–111)
Creatinine, Ser: 1.23 mg/dL — ABNORMAL HIGH (ref 0.44–1.00)
GFR, Estimated: 46 mL/min — ABNORMAL LOW (ref >60)
Glucose, Bld: 106 mg/dL — ABNORMAL HIGH (ref 70–99)
Potassium: 4.1 mmol/L (ref 3.5–5.1)
Sodium: 141 mmol/L (ref 135–145)

## 2021-04-23 LAB — CBC
HCT: 47 % — ABNORMAL HIGH (ref 36.0–46.0)
Hemoglobin: 14.7 g/dL (ref 12.0–15.0)
MCH: 26 pg (ref 26.0–34.0)
MCHC: 31.3 g/dL (ref 30.0–36.0)
MCV: 83 fL (ref 80.0–100.0)
Platelets: 125 10*3/uL — ABNORMAL LOW (ref 150–400)
RBC: 5.66 MIL/uL — ABNORMAL HIGH (ref 3.87–5.11)
RDW: 21.4 % — ABNORMAL HIGH (ref 11.5–15.5)
WBC: 7.2 10*3/uL (ref 4.0–10.5)
nRBC: 0 % (ref 0.0–0.2)

## 2021-04-23 MED ORDER — NYSTATIN 100000 UNIT/GM EX POWD: 1.0000 "application " | Freq: Three times a day (TID) | CUTANEOUS | 3 refills | Status: DC

## 2021-04-23 NOTE — Patient Instructions (Addendum)
STOP Farxiga START Nystatin powder, apply to affected area 2-3 times daily  -keep breast clean/dry -use dove soap to clean/bathe -use pillowcase under breast, change if soiled  You have been referred to Dermatology -they will call with appointment  Your physician recommends that you schedule a follow-up appointment in: 2-3 weeks  in the Advanced Practitioners (PA/NP) Clinic and in 2-3 months with Dr Haroldine Laws  Do the following things EVERYDAY: Weigh yourself in the morning before breakfast. Write it down and keep it in a log. Take your medicines as prescribed Eat low salt foods--Limit salt (sodium) to 2000 mg per day.  Stay as active as you can everyday Limit all fluids for the day to less than 2 liters  At the [facility], you and your health needs are our priority. As part of our continuing mission to provide you with exceptional heart care, we have created designated Provider Care Teams. These Care Teams include your primary Cardiologist (physician) and Advanced Practice Providers (APPs- Physician Assistants and Nurse Practitioners) who all work together to provide you with the care you need, when you need it.   You may see any of the following providers on your designated Care Team at your next follow up: Dr [name] Dr Loralie Champagne Dr Patrice Paradise, NP Lyda Jester, [place] Ginnie Smart Audry Riles, PharmD   Please be sure to bring in all your medications bottles to every appointment.

## 2021-04-28 ENCOUNTER — Telehealth (HOSPITAL_COMMUNITY): Payer: Self-pay

## 2021-04-28 NOTE — Telephone Encounter (Signed)
Attempted to reach [name] for scheduling a home paramedicine visit. No answer on home or cell phone, message left. I will continue to reach out.

## 2021-04-29 DIAGNOSIS — M109 Gout, unspecified: Secondary | ICD-10-CM | POA: Diagnosis not present

## 2021-04-29 DIAGNOSIS — I5020 Unspecified systolic (congestive) heart failure: Secondary | ICD-10-CM | POA: Diagnosis not present

## 2021-04-29 DIAGNOSIS — I25810 Atherosclerosis of coronary artery bypass graft(s) without angina pectoris: Secondary | ICD-10-CM | POA: Diagnosis not present

## 2021-04-29 DIAGNOSIS — K219 Gastro-esophageal reflux disease without esophagitis: Secondary | ICD-10-CM | POA: Diagnosis not present

## 2021-04-29 DIAGNOSIS — G4733 Obstructive sleep apnea (adult) (pediatric): Secondary | ICD-10-CM | POA: Diagnosis not present

## 2021-04-29 DIAGNOSIS — Z1389 Encounter for screening for other disorder: Secondary | ICD-10-CM | POA: Diagnosis not present

## 2021-04-29 DIAGNOSIS — E876 Hypokalemia: Secondary | ICD-10-CM | POA: Diagnosis not present

## 2021-04-29 DIAGNOSIS — E1122 Type 2 diabetes mellitus with diabetic chronic kidney disease: Secondary | ICD-10-CM | POA: Diagnosis not present

## 2021-04-29 DIAGNOSIS — Z0000 Encounter for general adult medical examination without abnormal findings: Secondary | ICD-10-CM | POA: Diagnosis not present

## 2021-04-29 DIAGNOSIS — N183 Chronic kidney disease, stage 3 unspecified: Secondary | ICD-10-CM | POA: Diagnosis not present

## 2021-04-29 DIAGNOSIS — E7800 Pure hypercholesterolemia, unspecified: Secondary | ICD-10-CM | POA: Diagnosis not present

## 2021-04-29 DIAGNOSIS — I10 Essential (primary) hypertension: Secondary | ICD-10-CM | POA: Diagnosis not present

## 2021-04-29 DIAGNOSIS — D6869 Other thrombophilia: Secondary | ICD-10-CM | POA: Diagnosis not present

## 2021-04-29 DIAGNOSIS — F411 Generalized anxiety disorder: Secondary | ICD-10-CM | POA: Diagnosis not present

## 2021-04-30 ENCOUNTER — Other Ambulatory Visit: Payer: Self-pay

## 2021-04-30 ENCOUNTER — Ambulatory Visit (INDEPENDENT_AMBULATORY_CARE_PROVIDER_SITE_OTHER): Payer: HMO | Admitting: Internal Medicine

## 2021-04-30 VITALS — BP 112/80 | HR 84 | Ht 67.0 in | Wt 181.4 lb

## 2021-04-30 DIAGNOSIS — I4819 Other persistent atrial fibrillation: Secondary | ICD-10-CM | POA: Diagnosis not present

## 2021-04-30 DIAGNOSIS — I5042 Chronic combined systolic (congestive) and diastolic (congestive) heart failure: Secondary | ICD-10-CM | POA: Diagnosis not present

## 2021-04-30 MED ORDER — METOPROLOL SUCCINATE ER 100 MG PO TB24: 100.0000 mg | ORAL_TABLET | Freq: Every day | ORAL | 3 refills | Status: DC

## 2021-04-30 NOTE — Progress Notes (Signed)
HPI [name] [name] returns today for ongoing followup of her PPM, chronic atrial fib with a RVR, s/p AV node ablation and PPM insertion. She has c/o fatigue and weakness. She has dyspnea with exertion. Her bp has run a little low. She has some dietary and medical non-compliance.  Allergies  Allergen Reactions   Bee Venom Anaphylaxis   Ivp Dye [Iodinated Diagnostic Agents] Anaphylaxis   Ace Inhibitors     Other reaction(s): angioedema   Atenolol     Other reaction(s): severe headaches   Codeine Nausea And Vomiting   Entresto [Sacubitril-Valsartan] Other (See Comments)    Chest pain    Penicillin G     Other reaction(s): rash   Shrimp [Shellfish Allergy] Swelling   Simvastatin     Other reaction(s): not sure   Jardiance [Empagliflozin] Other (See Comments)    Caused Boils      Current Outpatient Medications  Medication Sig Dispense Refill   acetaminophen (TYLENOL) 325 MG tablet Take 2 tablets (650 mg total) by mouth every 4 (four) hours as needed for headache or mild pain.     allopurinol (ZYLOPRIM) 300 MG tablet Take 1 tablet (300 mg total) by mouth daily. 30 tablet 11   atorvastatin (LIPITOR) 10 MG tablet Take 10 mg by mouth at bedtime.     losartan (COZAAR) 25 MG tablet Take 1 tablet (25 mg total) by mouth daily. 90 tablet 3   metFORMIN (GLUCOPHAGE) 500 MG tablet Take 500 mg by mouth daily with breakfast. Daily with a meal     metoprolol succinate (TOPROL-XL) 100 MG 24 hr tablet Take 1 tablet (100 mg total) by mouth daily. Take with or immediately following a meal. 90 tablet 3   nystatin (MYCOSTATIN/NYSTOP) powder Apply 1 application topically 3 (three) times daily. 90 g 3   rivaroxaban (XARELTO) 20 MG TABS tablet Take 1 tablet (20 mg total) by mouth daily with supper. Resume from 11/01/2019     Tiotropium Bromide Monohydrate (SPIRIVA RESPIMAT) 2.5 MCG/ACT AERS Inhale 2 puffs into the lungs daily. 1 g 5   torsemide (DEMADEX) 20 MG tablet Take 40 mg by mouth daily.     No  current facility-administered medications for this visit.     Past Medical History:  Diagnosis Date   ([place]) heart failure with improved ejection fraction ([place])    a. 07/2018 Echo: EF 30-35%; b. 11/2019 Echo: EF 55-60%, no rwma, Gr2 DD, Nl RV size/fxn. Mild BAE. Mild MR/AI.   Arthritis    Asthma    Atopic dermatitis    CAD (coronary artery disease)    a. 2016 s/p CABG x 2 (LIMA->LAD, VG->OM); b. 08/2018 MV: EF 44%, no ischemia/infact.   Cardiac arrest ([place]) 10/2014   Cardiomyopathy, ischemic    a. 07/2018 Echo: EF 30-35%; 11/2019 Echo: EF 55-60%.   Carotid arterial disease ([place])    a. 11/2019 Carotid U/S   CKD (chronic kidney disease), stage III (HCC)    COPD (chronic obstructive pulmonary disease) ([place])    Esophageal dilatation 2013   GERD (gastroesophageal reflux disease)    Gout    Headache    Hypertension    Hypokalemia    Idiopathic angioedema    LGI bleed 08/06/2017   a. felt to be hemorrhoidal during that admission (no drop in Hgb).   Lower back pain    Paroxysmal atrial fibrillation (HCC)    a. Dx 2016-->h/o difficult to control rates (complicated by noncompliance), not felt to be a candidate  for ablation or antiarrhythmic due to noncompliance; b. Recurrent AF 2021 - converted w/ IV dilt; c. CHA2DS2VASc = 7-->Xarelto.   Personal history of noncompliance with medical treatment, presenting hazards to health    Prediabetes    S/P CABG x 2 with clipping of LA appendage 11/14/2014   LIMA to LAD, SVG to OM, EVH via right thigh   Urine incontinence    Uterine fibroid     ROS:   All systems reviewed and negative except as noted in the HPI.   Past Surgical History:  Procedure Laterality Date   AV NODE ABLATION N/A 01/18/2021   Procedure: AV NODE ABLATION;  Surgeon: Evans Lance, MD;  Location: [facility];  Service: Cardiovascular;  Laterality: N/A;   BALLOON DILATION N/A 10/10/2019   Procedure: BALLOON DILATION;  Surgeon: Otis Brace, MD;  Location: [facility]  ENDOSCOPY;  Service: Gastroenterology;  Laterality: N/A;   BIOPSY  10/10/2019   Procedure: BIOPSY;  Surgeon: Otis Brace, MD;  Location: [facility];  Service: Gastroenterology;;   BIOPSY  10/11/2019   Procedure: BIOPSY;  Surgeon: Otis Brace, MD;  Location: [facility];  Service: Gastroenterology;;   CARDIOVERSION N/A 11/18/2014   Procedure: CARDIOVERSION;  Surgeon: [name], MD;  Location: [facility];  Service: Cardiovascular;  Laterality: N/A;   CARDIOVERSION N/A 12/25/2017   Procedure: CARDIOVERSION;  Surgeon: [name], MD;  Location: [facility];  Service: Cardiovascular;  Laterality: N/A;   CLIPPING OF ATRIAL APPENDAGE N/A 11/14/2014   Procedure: CLIPPING OF ATRIAL APPENDAGE;  Surgeon: [name], MD;  Location: [place];  Service: Open Heart Surgery;  Laterality: N/A;   COLONOSCOPY  2013   COLONOSCOPY WITH PROPOFOL N/A 10/11/2019   Procedure: COLONOSCOPY WITH PROPOFOL;  Surgeon: Otis Brace, MD;  Location: [facility];  Service: Gastroenterology;  Laterality: N/A;   CORONARY ARTERY BYPASS GRAFT N/A 11/14/2014   Procedure: CORONARY ARTERY BYPASS GRAFTING (CABG)TIMES 2 USING LEFT INTERNAL MAMMARY ARTERY AND RIGHT SAPHENOUS VEIN HARVESTED ENDOSCOPICALLY;  Surgeon: [name], MD;  Location: [facility];  Service: Open Heart Surgery;  Laterality: N/A;   ESOPHAGOGASTRODUODENOSCOPY (EGD) WITH PROPOFOL N/A 10/10/2019   Procedure: ESOPHAGOGASTRODUODENOSCOPY (EGD) WITH PROPOFOL;  Surgeon: Otis Brace, MD;  Location: [facility];  Service: Gastroenterology;  Laterality: N/A;   ESOPHAGOGASTRODUODENOSCOPY (EGD) WITH PROPOFOL N/A 10/27/2019   Procedure: ESOPHAGOGASTRODUODENOSCOPY (EGD) WITH PROPOFOL;  Surgeon: Ronnette Juniper, MD;  Location: [place] [facility];  Service: Gastroenterology;  Laterality: N/A;   GIVENS CAPSULE STUDY N/A 10/27/2019   Procedure: GIVENS CAPSULE STUDY;  Surgeon: Ronnette Juniper, MD;  Location: [place];  Service: Gastroenterology;  Laterality:  N/A;   LEFT HEART CATHETERIZATION WITH CORONARY ANGIOGRAM N/A 11/04/2014   Procedure: LEFT HEART CATHETERIZATION WITH CORONARY ANGIOGRAM;  Surgeon: Troy Sine, MD;  Location[facility] C[facility] LAB;  Service: Cardiovascular;  Laterality: N/A;   PACEMAKER IMPLANT N/A 01/18/2021   Procedure: PACEMAKER IMPLANT;  Surgeon: Evans Lance, MD;  Location: [place] CV LAB;  Service: Cardiovascular;  Laterality: N/A;   PACEMAKER IMPLANT Left    POLYPECTOMY  10/11/2019   Procedure: POLYPECTOMY;  Surgeon: Otis Brace, MD;  Location: [place] [facility] ENDOSCOPY;  Service: Gastroenterology;;   TEE WITHOUT CARDIOVERSION N/A 11/14/2014   Procedure: TRANSESOPHAGEAL ECHOCARDIOGRAM (TEE);  Surgeon: [name], MD;  Location: [facility];  Service: Open Heart Surgery;  Laterality: N/A;   TEE WITHOUT CARDIOVERSION N/A 12/25/2017   Procedure: TRANSESOPHAGEAL ECHOCARDIOGRAM (TEE);  Surgeon: [name], MD;  Location: [place];  Service: Cardiovascular;  Laterality:  N/A;   TEMPORARY PACEMAKER INSERTION  11/04/2014   Procedure: TEMPORARY PACEMAKER INSERTION;  Surgeon: Troy Sine, MD;  Location: [facility] CATH LAB;  Service: Cardiovascular;;   TOOTH EXTRACTION       Family History  Problem Relation Age of Onset   Cancer Mother        LYMPHOMA   Heart disease Father    Other Father        TB   CVA Sister    Prostate cancer Brother 5   Diabetes Brother      Social History   Socioeconomic History   Marital status: Widowed    Spouse name: Not on file   Number of children: 1   Years of education: 25   Highest education level: Not on file  Occupational History   Occupation: RETIRED [employer]  Tobacco Use   Smoking status: Every Day    Packs/day: 0.50    Years: 56.00    Pack years: 28.00    Types: Cigarettes   Smokeless tobacco: Never   Tobacco comments:    smoking 2 cigarettes/day as of 10/23/20  Vaping Use   Vaping Use: Never used  Substance and Sexual Activity   Alcohol use: No     Alcohol/week: 0.0 standard drinks   Drug use: No   Sexual activity: Not on file  Other Topics Concern   Not on file  Social History Narrative   Patient reports it being difficult to pay for everything due to outstanding medical bills from hospital stay last year but states she is able to keep up with basic expenses.  Patient does have some concerns with her house's condition due to a water leak- had her roof repaired last month but has some leaking in the front which has affected her porch.  Patient owns her own car and is able to drive herself but has some concerns about the reliability of her car- has gotten taxi to the clinic for appointment in the past when her car broke down.   Social Determinants of Health   Financial Resource Strain: Not on file  Food Insecurity: Not on file  Transportation Needs: Not on file  Physical Activity: Not on file  Stress: Not on file  Social Connections: Not on file  Intimate Partner Violence: Not on file     BP 112/80   Pulse 84   Ht '5\' 7"'$  (1.702 m)   Wt 181 lb 6.4 oz (82.3 kg)   SpO2 98%   BMI 28.41 kg/m   Physical Exam:  Well appearing NAD HEENT: Unremarkable Neck:  No JVD, no thyromegally Lymphatics:  No adenopathy Back:  No CVA tenderness Lungs:  Clear with no wheezes HEART:  Regular rate rhythm, no murmurs, no rubs, no clicks Abd:  soft, positive bowel sounds, no organomegally, no rebound, no guarding Ext:  2 plus pulses, no edema, no cyanosis, no clubbing Skin:  No rashes no nodules Neuro:  CN II through XII intact, motor grossly intact  EKG - atrial fib with ventricular pacing as well as sensing  DEVICE  Normal device function.  See PaceArt for details.   Assess/Plan:  Uncontrolled atrial fib - her VR is well controlled after AV node ablation. We will reduce her dose of rate control. HTN - her bp is a little on the low side. I have asked her to reduce her toprol to 100 mg daily PPM - her interrogation demonstrates normal  device function.  Chronic diastolic heart failure -  she will continue her demadex. She is encouraged to avoid salty foods.  Carleene Overlie Atsushi Yom,MD

## 2021-04-30 NOTE — Patient Instructions (Signed)
Medication Instructions:  Your physician has recommended you make the following change in your medication: DECREASE Toprol to 100 mg ONCE daily  *If you need a refill on your cardiac medications before your next appointment, please call your pharmacy*   Lab Work: None ordered   Testing/Procedures: None ordered   Follow-Up: At [facility], you and your health needs are our priority.  As part of our continuing mission to provide you with exceptional heart care, we have created designated Provider Care Teams.  These Care Teams include your primary Cardiologist (physician) and Advanced Practice Providers (APPs -  Physician Assistants and Nurse Practitioners) who all work together to provide you with the care you need, when you need it.  We recommend signing up for the patient portal called "MyChart".  Sign up information is provided on this After Visit Summary.  MyChart is used to connect with patients for Virtual Visits (Telemedicine).  Patients are able to view lab/test results, encounter notes, upcoming appointments, etc.  Non-urgent messages can be sent to your provider as well.   To learn more about what you can do with MyChart, go to [email or web address].    Your next appointment:   1 year(s)  The format for your next appointment:   In Person  Provider:   [name], MD    Thank you for choosing [facility]!!

## 2021-05-03 ENCOUNTER — Telehealth (HOSPITAL_COMMUNITY): Payer: Self-pay

## 2021-05-03 NOTE — Telephone Encounter (Signed)
Ms. Butler returned my phone calls and we agreed on home visit for paramedicine on Thursday at 2:00. Call complete.

## 2021-05-04 ENCOUNTER — Telehealth: Payer: Self-pay

## 2021-05-04 NOTE — Telephone Encounter (Signed)
-----   Message from Benedetto Goad, RN sent at 05/03/2021  7:50 AM EDT ----- Regarding: no transmission 21 days Can you please call patient to see if monitor is plugged in. Thx!

## 2021-05-04 NOTE — Telephone Encounter (Signed)
The patient states she does not have a monitor. [employer] sending her a new monitor in 3-5 business days.

## 2021-05-06 ENCOUNTER — Telehealth (HOSPITAL_COMMUNITY): Payer: Self-pay

## 2021-05-06 NOTE — Telephone Encounter (Signed)
Attempted to call [name] [name] to confirm appointment for today with no answer. I will continue to reach out. I plan to meet her at clinic next week regardless of home visit outcome today.

## 2021-05-07 ENCOUNTER — Telehealth: Payer: Self-pay | Admitting: Internal Medicine

## 2021-05-07 NOTE — Telephone Encounter (Signed)
[name] [name] is calling stating she has a surprise for Dr. Lovena Le she will be picking up at 12:00 and bringing to him. She wants to make sure he'll be there to get it from her when she arrives.

## 2021-05-07 NOTE — Telephone Encounter (Signed)
Attempted to return call to Pt.

## 2021-05-12 ENCOUNTER — Encounter (HOSPITAL_COMMUNITY): Payer: HMO

## 2021-05-12 DIAGNOSIS — E1122 Type 2 diabetes mellitus with diabetic chronic kidney disease: Secondary | ICD-10-CM | POA: Diagnosis not present

## 2021-05-12 DIAGNOSIS — K219 Gastro-esophageal reflux disease without esophagitis: Secondary | ICD-10-CM | POA: Diagnosis not present

## 2021-05-12 DIAGNOSIS — M8580 Other specified disorders of bone density and structure, unspecified site: Secondary | ICD-10-CM | POA: Diagnosis not present

## 2021-05-12 DIAGNOSIS — E7800 Pure hypercholesterolemia, unspecified: Secondary | ICD-10-CM | POA: Diagnosis not present

## 2021-05-12 DIAGNOSIS — N183 Chronic kidney disease, stage 3 unspecified: Secondary | ICD-10-CM | POA: Diagnosis not present

## 2021-05-12 DIAGNOSIS — I4891 Unspecified atrial fibrillation: Secondary | ICD-10-CM | POA: Diagnosis not present

## 2021-05-12 DIAGNOSIS — J441 Chronic obstructive pulmonary disease with (acute) exacerbation: Secondary | ICD-10-CM | POA: Diagnosis not present

## 2021-05-12 DIAGNOSIS — J449 Chronic obstructive pulmonary disease, unspecified: Secondary | ICD-10-CM | POA: Diagnosis not present

## 2021-05-12 DIAGNOSIS — I10 Essential (primary) hypertension: Secondary | ICD-10-CM | POA: Diagnosis not present

## 2021-05-12 DIAGNOSIS — I25810 Atherosclerosis of coronary artery bypass graft(s) without angina pectoris: Secondary | ICD-10-CM | POA: Diagnosis not present

## 2021-05-12 DIAGNOSIS — M1990 Unspecified osteoarthritis, unspecified site: Secondary | ICD-10-CM | POA: Diagnosis not present

## 2021-05-12 DIAGNOSIS — I5023 Acute on chronic systolic (congestive) heart failure: Secondary | ICD-10-CM | POA: Diagnosis not present

## 2021-05-13 ENCOUNTER — Telehealth (HOSPITAL_COMMUNITY): Payer: Self-pay

## 2021-05-13 NOTE — Telephone Encounter (Signed)
Paramedicine attempted to visit and reach out to patient over the last month with no success. Per [name], PA with [facility], clear to discharge from paramedicine list.   Discharge Complete. 05/13/21

## 2021-05-17 ENCOUNTER — Telehealth (HOSPITAL_COMMUNITY): Payer: Self-pay | Admitting: Vascular Surgery

## 2021-05-17 NOTE — Telephone Encounter (Signed)
Spoke with pt she c/o lightheadedness since yesterday. Pt said she drank a body armor and since then hasnt felt right. Pt said she has taken all of her medications no changes to diet except drinking the body armor. Pt can not check bp at home does not have a cuff. Pt denies any other complaints at this time but requests an office visit. Pt added to APP schedule tomorrow.

## 2021-05-17 NOTE — Telephone Encounter (Signed)
Pt lvm on scheduling line wanting to see the triage nurse today, because he head is swimming.. please advise

## 2021-05-18 ENCOUNTER — Ambulatory Visit (HOSPITAL_COMMUNITY)
Admission: RE | Admit: 2021-05-18 | Discharge: 2021-05-18 | Disposition: A | Discharge status: Home / Self Care | Payer: HMO | Source: Ambulatory Visit | Attending: Family Medicine | Admitting: Family Medicine

## 2021-05-18 ENCOUNTER — Other Ambulatory Visit: Payer: Self-pay

## 2021-05-18 ENCOUNTER — Encounter (HOSPITAL_COMMUNITY): Payer: Self-pay

## 2021-05-18 ENCOUNTER — Other Ambulatory Visit (HOSPITAL_COMMUNITY): Payer: Self-pay | Admitting: Family Medicine

## 2021-05-18 VITALS — BP 122/78 | HR 68 | Wt 186.8 lb

## 2021-05-18 DIAGNOSIS — Z720 Tobacco use: Secondary | ICD-10-CM | POA: Diagnosis not present

## 2021-05-18 DIAGNOSIS — Z7984 Long term (current) use of oral hypoglycemic drugs: Secondary | ICD-10-CM | POA: Insufficient documentation

## 2021-05-18 DIAGNOSIS — I5042 Chronic combined systolic (congestive) and diastolic (congestive) heart failure: Secondary | ICD-10-CM

## 2021-05-18 DIAGNOSIS — E785 Hyperlipidemia, unspecified: Secondary | ICD-10-CM | POA: Insufficient documentation

## 2021-05-18 DIAGNOSIS — R42 Dizziness and giddiness: Secondary | ICD-10-CM

## 2021-05-18 DIAGNOSIS — Z950 Presence of cardiac pacemaker: Secondary | ICD-10-CM | POA: Insufficient documentation

## 2021-05-18 DIAGNOSIS — N183 Chronic kidney disease, stage 3 unspecified: Secondary | ICD-10-CM | POA: Insufficient documentation

## 2021-05-18 DIAGNOSIS — I130 Hypertensive heart and chronic kidney disease with heart failure and stage 1 through stage 4 chronic kidney disease, or unspecified chronic kidney disease: Secondary | ICD-10-CM | POA: Diagnosis not present

## 2021-05-18 DIAGNOSIS — Z87891 Personal history of nicotine dependence: Secondary | ICD-10-CM | POA: Insufficient documentation

## 2021-05-18 DIAGNOSIS — Z79899 Other long term (current) drug therapy: Secondary | ICD-10-CM | POA: Diagnosis not present

## 2021-05-18 DIAGNOSIS — I2510 Atherosclerotic heart disease of native coronary artery without angina pectoris: Secondary | ICD-10-CM | POA: Diagnosis not present

## 2021-05-18 DIAGNOSIS — N1832 Chronic kidney disease, stage 3b: Secondary | ICD-10-CM

## 2021-05-18 DIAGNOSIS — Z7951 Long term (current) use of inhaled steroids: Secondary | ICD-10-CM | POA: Insufficient documentation

## 2021-05-18 DIAGNOSIS — I10 Essential (primary) hypertension: Secondary | ICD-10-CM | POA: Diagnosis not present

## 2021-05-18 DIAGNOSIS — I4819 Other persistent atrial fibrillation: Secondary | ICD-10-CM

## 2021-05-18 DIAGNOSIS — I255 Ischemic cardiomyopathy: Secondary | ICD-10-CM | POA: Diagnosis not present

## 2021-05-18 DIAGNOSIS — I4821 Permanent atrial fibrillation: Secondary | ICD-10-CM | POA: Diagnosis not present

## 2021-05-18 DIAGNOSIS — Z7901 Long term (current) use of anticoagulants: Secondary | ICD-10-CM | POA: Diagnosis not present

## 2021-05-18 DIAGNOSIS — Z951 Presence of aortocoronary bypass graft: Secondary | ICD-10-CM | POA: Diagnosis not present

## 2021-05-18 LAB — BASIC METABOLIC PANEL
Anion gap: 11 (ref 5–15)
BUN: 42 mg/dL — ABNORMAL HIGH (ref 8–23)
CO2: 22 mmol/L (ref 22–32)
Calcium: 9.4 mg/dL (ref 8.9–10.3)
Chloride: 108 mmol/L (ref 98–111)
Creatinine, Ser: 1.38 mg/dL — ABNORMAL HIGH (ref 0.44–1.00)
GFR, Estimated: 40 mL/min — ABNORMAL LOW (ref >60)
Glucose, Bld: 100 mg/dL — ABNORMAL HIGH (ref 70–99)
Potassium: 3.7 mmol/L (ref 3.5–5.1)
Sodium: 141 mmol/L (ref 135–145)

## 2021-05-18 LAB — CBC
HCT: 51.6 % — ABNORMAL HIGH (ref 36.0–46.0)
Hemoglobin: 16.1 g/dL — ABNORMAL HIGH (ref 12.0–15.0)
MCH: 26.1 pg (ref 26.0–34.0)
MCHC: 31.2 g/dL (ref 30.0–36.0)
MCV: 83.6 fL (ref 80.0–100.0)
Platelets: 112 10*3/uL — ABNORMAL LOW (ref 150–400)
RBC: 6.17 MIL/uL — ABNORMAL HIGH (ref 3.87–5.11)
RDW: 22.1 % — ABNORMAL HIGH (ref 11.5–15.5)
WBC: 7 10*3/uL (ref 4.0–10.5)
nRBC: 0 % (ref 0.0–0.2)

## 2021-05-18 MED ORDER — MECLIZINE HCL 12.5 MG PO TABS: 12.5000 mg | ORAL_TABLET | Freq: Four times a day (QID) | ORAL | 0 refills | Status: DC | PRN

## 2021-05-18 NOTE — Progress Notes (Signed)
ReDS Vest / Clip - 05/18/21 1400       ReDS Vest / Clip   Station Marker C    Ruler Value 25.5    ReDS Value Range Low volume    ReDS Actual Value 33

## 2021-05-18 NOTE — Progress Notes (Addendum)
Advanced Heart Failure Clinic Note  PCP: Dr Lysle Rubens  Primary Cardiologist: [name]  EP: Dr Lovena Le HF MD: Dr Haroldine Laws   HPI: [name] [name] is a 75 y.o. female with h/o HTN, former tobacco abuse, permanent AF, CAD s/p CABG x 2 (LIMA to LAD and SVG to OM) in 2016, Chronic combined CHF, Ischemic CMP, HLD, and Biotronik PPM.   She was initially noted to be in atrial fibrillation in 2016 with a reduced LVEF, leading to ischemic workup with a cardiac cath showing severe multivessel CAD with LM involvement in which she underwent CABG x2 utilizing LIMA to LAD, SVG to OM.   Admitted 1/19 for AF with RVR in setting of Influenza A. EP was consulted to discuss plans for Tikosyn vs. ablation vs. medication rate control however, due to her known medication noncompliance she was found to not be an ablation nor antiarrhythmic candidate   Admitted 3/19 with recurrent Afib RVR and ADHF. Started on diltiazem. Echo repeated which showed fall in EF from previous. Diuresed with IV lasix. Discharged home on diltiazem 360, Torpol 100, digoxin and Xarelto.   2019 back in Afib. She was started on amiodarone and diltiazem was DCd. She was also started on Entresto. She underwent successful atrial flutter TEE/DCCV on 12/25/17.  Later went back in A fib. Toprol DC'd with ongoing bradycardia. She later placed on low dose carvedilol.   Intolerant Entresto due to chest pain.    Admitted 3/21 for iron-def anemia. Hgb 5.4.  She was started on IV Protonix.  GI was consulted.  Underwent EGD/colonoscopy which were unremarkable and capsule endoscopy which showed duodenal ulcer with bleeding and stigmata.  GI recommended oral Protonix and outpatient follow-up.  Admitted 01/18/21 for AV node ablation and PPM due to uncontrolled heart rates with A fib. Underwent Biotronix PPM. Toprol XL and digoxin stopped. Carvedilol 25 mg bid started. She was discharged 01/19/21.   She called Dr Forde Dandy office 02/04/21 due to headache after  starting carvedilol. Carvedilol was stopped and she was switched back to Toprol XL.    Admitted 5/97/41 with a/c systolic heart failure. Diuresed with IV lasix and transitioned to torsemide 40 mg daily. Echo completed and showed EF 25-30% with severe MR/TR.    Today she returns for an acute visit with complaints of dizziness. Started 2 days ago after she drank a Teaching laboratory technician drink, and occurs when she lays down, closes her eyes and with head movements. No falls. Her right ear "feels stopped up." She is SOB at times with activity, no worse recently. Denies CP, edema, or PND/Orthopnea. Appetite ok. No fever or chills. Weight at home 186 pounds. Taking all medications. She has stopped smoking but unsure if she will remain quit. She does not check her blood sugars at home.   Cardiac Studies   Echo 3/16 EF 35-40%. Echo 10/17 EF improved to 50-55%. Echo 5/18 EF stable 50-55% Echo 3/19 LVEF 30-35%, Moderate LVH, Mild AI, Moderate regurgitation, Severe LAE, Moderately reduced RV, Severe RAE, PA peak pressure 50 mm Hg.   (Decreased from Echo 01/11/2017 with LVEF 50-55%) Echo 12/19 Ef 30-35% RV mildly dilated. Echo 4/21 EF 55-60%  Echo 11/21 EF 30-35% RV normal  Echo 4/22 EF 25-30% RV moderately reduced  Echo 6/22 EF 25-30% RV severely reduced  Stress Test 1/20- Low Risk EF 44%  ROS: All systems negative except as listed in HPI, PMH and Problem List.  SH:  Social History  Socioeconomic History   Marital status: Widowed    Spouse name: Not on file   Number of children: 1   Years of education: 66   Highest education level: Not on file  Occupational History   Occupation: RETIRED [employer]  Tobacco Use   Smoking status: Every Day    Packs/day: 0.50    Years: 56.00    Pack years: 28.00    Types: Cigarettes   Smokeless tobacco: Never   Tobacco comments:    smoking 2 cigarettes/day as of 10/23/20  Vaping Use   Vaping Use: Never used  Substance and Sexual Activity   Alcohol use: No     Alcohol/week: 0.0 standard drinks   Drug use: No   Sexual activity: Not on file  Other Topics Concern   Not on file  Social History Narrative   Patient reports it being difficult to pay for everything due to outstanding medical bills from hospital stay last year but states she is able to keep up with basic expenses.  Patient does have some concerns with her house's condition due to a water leak- had her roof repaired last month but has some leaking in the front which has affected her porch.  Patient owns her own car and is able to drive herself but has some concerns about the reliability of her car- has gotten taxi to the clinic for appointment in the past when her car broke down.   Social Determinants of Health   Financial Resource Strain: Not on file  Food Insecurity: Not on file  Transportation Needs: Not on file  Physical Activity: Not on file  Stress: Not on file  Social Connections: Not on file  Intimate Partner Violence: Not on file   FH:  Family History  Problem Relation Age of Onset   Cancer Mother        LYMPHOMA   Heart disease Father    Other Father        TB   CVA Sister    Prostate cancer Brother 33   Diabetes Brother    Past Medical History:  Diagnosis Date   ([place]) heart failure with improved ejection fraction ([place])    a. 07/2018 Echo: EF 30-35%; b. 11/2019 Echo: EF 55-60%, no rwma, Gr2 DD, Nl RV size/fxn. Mild BAE. Mild MR/AI.   Arthritis    Asthma    Atopic dermatitis    CAD (coronary artery disease)    a. 2016 s/p CABG x 2 (LIMA->LAD, VG->OM); b. 08/2018 MV: EF 44%, no ischemia/infact.   Cardiac arrest ([place]) 10/2014   Cardiomyopathy, ischemic    a. 07/2018 Echo: EF 30-35%; 11/2019 Echo: EF 55-60%.   Carotid arterial disease ([place])    a. 11/2019 Carotid U/S   CKD (chronic kidney disease), stage III (HCC)    COPD (chronic obstructive pulmonary disease) ([place])    Esophageal dilatation 2013   GERD (gastroesophageal reflux disease)    Gout    Headache     Hypertension    Hypokalemia    Idiopathic angioedema    LGI bleed 08/06/2017   a. felt to be hemorrhoidal during that admission (no drop in Hgb).   Lower back pain    Paroxysmal atrial fibrillation (HCC)    a. Dx 2016-->h/o difficult to control rates (complicated by noncompliance), not felt to be a candidate for ablation or antiarrhythmic due to noncompliance; b. Recurrent AF 2021 - converted w/ IV dilt; c. CHA2DS2VASc = 7-->Xarelto.   Personal history of noncompliance with  medical treatment, presenting hazards to health    Prediabetes    S/P CABG x 2 with clipping of LA appendage 11/14/2014   LIMA to LAD, SVG to OM, EVH via right thigh   Urine incontinence    Uterine fibroid    Current Outpatient Medications  Medication Sig Dispense Refill   acetaminophen (TYLENOL) 325 MG tablet Take 2 tablets (650 mg total) by mouth every 4 (four) hours as needed for headache or mild pain.     allopurinol (ZYLOPRIM) 300 MG tablet Take 1 tablet (300 mg total) by mouth daily. 30 tablet 11   atorvastatin (LIPITOR) 10 MG tablet Take 10 mg by mouth at bedtime.     losartan (COZAAR) 25 MG tablet Take 1 tablet (25 mg total) by mouth daily. 90 tablet 3   metFORMIN (GLUCOPHAGE) 500 MG tablet Take 500 mg by mouth daily with breakfast. Daily with a meal     metoprolol succinate (TOPROL-XL) 100 MG 24 hr tablet Take 1 tablet (100 mg total) by mouth daily. Take with or immediately following a meal. 90 tablet 3   nystatin (MYCOSTATIN/NYSTOP) powder Apply 1 application topically 3 (three) times daily. 90 g 3   pantoprazole (PROTONIX) 40 MG tablet Take 40 mg by mouth daily.     rivaroxaban (XARELTO) 20 MG TABS tablet Take 1 tablet (20 mg total) by mouth daily with supper. Resume from 11/01/2019     Tiotropium Bromide Monohydrate (SPIRIVA RESPIMAT) 2.5 MCG/ACT AERS Inhale 2 puffs into the lungs daily. 1 g 5   torsemide (DEMADEX) 20 MG tablet Take 40 mg by mouth daily.     No current facility-administered medications  for this encounter.   BP 122/78   Pulse 68   Wt 84.7 kg (186 lb 12.8 oz)   SpO2 95%   BMI 29.26 kg/m   Wt Readings from Last 3 Encounters:  05/18/21 84.7 kg (186 lb 12.8 oz)  04/30/21 82.3 kg (181 lb 6.4 oz)  04/23/21 86.1 kg (189 lb 12.8 oz)   PHYSICAL EXAM: General:  NAD. No resp difficulty HEENT: Normal Neck: Supple. No JVD. Carotids 2+ bilat; no bruits. No lymphadenopathy or thryomegaly appreciated. Cor: PMI nondisplaced. Irregular rate & rhythm. No rubs, gallops or murmurs. Lungs: Clear Abdomen: Soft, nontender, nondistended. No hepatosplenomegaly. No bruits or masses. Good bowel sounds. Extremities: No cyanosis, clubbing, rash, edema Neuro: Alert & oriented x 3, cranial nerves grossly intact. Moves all 4 extremities w/o difficulty. Affect pleasant.  ReDs: 33%  ECG: atrial fibrillation with V-pacing 73 bpm (personally reviewed)  ASSESSMENT & PLAN: Dizziness - Sounds like vertigo vs allergy/ear infection. - Start meclizine 12.5 mg prn - Needs PCP follow up. - Check labs today. - Given Rx for BP cuff and asked to check BP when she feels dizzy.  2. Chronic Combined /Diastolic Systolic HF: - Suspect etiology is combined Ischemic/NICM with AF. - EF back in 2021 had improved to 55-60% but back down  - NYHA II. Volume status ok today, ReDs 33%. - Continue torsemide 40 mg daily. - Continue Toprol XL 100 mg daily, dose recently reduced by EP due to soft BP (intolerant to Coreg due to HA). - Continue losartan 25 mg daily (Intolerant Entresto).  - Consider restarting spiro, will hold off today with dizziness. - No SGLT2i with candidal infection. - BMET today. - Plan to repeat ECHO after HF meds optimized.     3. Permanent A fib - S/P AV node ablation and PPM implant.  - On Xarelto. No  bleeding issues. - Recent CBC stable.  4. CAD - s/p CABG x 2 2016 (LIMA to LAD and SVG to OM) - Stress Test 08/2018 low risk. EF 44%.   - No s/s ischemia. - Continue statin.  5. HTN   - Stable.   6. CKD Stage III - Labs today.  7. Tobacco Abuse - Congratulated on quitting.  She has been referred to Paramedicine however they have been unable to have reliable contact with patient and she has thus been discharged.   Follow up with APP as scheduled, plan to add back spiro if able.  Maricela Bo Ave Scharnhorst FNP-BC 2:06 PM

## 2021-05-18 NOTE — Patient Instructions (Signed)
START Meclizine 12.5 mg, one taab every 6-12 hours as needed for dizziness  Labs today We will only contact you if something comes back abnormal or we need to make some changes. Otherwise no news is good news!  Be sure to schedule a follow up appointment with your primary care doctor.  Keep  cardiology follow up as scheduled   Do the following things EVERYDAY: Weigh yourself in the morning before breakfast. Write it down and keep it in a log. Take your medicines as prescribed Eat low salt foods--Limit salt (sodium) to 2000 mg per day.  Stay as active as you can everyday Limit all fluids for the day to less than 2 liters  At the [facility], you and your health needs are our priority. As part of our continuing mission to provide you with exceptional heart care, we have created designated Provider Care Teams. These Care Teams include your primary Cardiologist (physician) and Advanced Practice Providers (APPs- Physician Assistants and Nurse Practitioners) who all work together to provide you with the care you need, when you need it.   You may see any of the following providers on your designated Care Team at your next follow up: Dr [name] Dr Loralie Champagne Dr Patrice Paradise, NP Lyda Jester, [place] Ginnie Smart Audry Riles, PharmD   Please be sure to bring in all your medications bottles to every appointment.

## 2021-05-20 DIAGNOSIS — H2511 Age-related nuclear cataract, right eye: Secondary | ICD-10-CM | POA: Diagnosis not present

## 2021-06-01 NOTE — Progress Notes (Signed)
Advanced Heart Failure Clinic Note  PCP: Dr Lysle Rubens  Primary Cardiologist: [name]  EP: Dr Lovena Le HF MD: Dr Haroldine Laws   HPI: [name] [name] is a 75 y.o. female with h/o HTN, former tobacco abuse, permanent AF, CAD s/p CABG x 2 (LIMA to LAD and SVG to OM) in 2016, Chronic combined CHF, Ischemic CMP, HLD, and Biotronik PPM.   She was initially noted to be in atrial fibrillation in 2016 with a reduced LVEF, leading to ischemic workup with a cardiac cath showing severe multivessel CAD with LM involvement in which she underwent CABG x2 utilizing LIMA to LAD, SVG to OM.   Admitted 1/19 for AF with RVR in setting of Influenza A. EP was consulted to discuss plans for Tikosyn vs. ablation vs. medication rate control however, due to her known medication noncompliance she was found to not be an ablation nor antiarrhythmic candidate   Admitted 3/19 with recurrent Afib RVR and ADHF. Started on diltiazem. Echo repeated which showed fall in EF from previous. Diuresed with IV lasix. Discharged home on diltiazem 360, Torpol 100, digoxin and Xarelto.   2019 back in Afib. She was started on amiodarone & Entresto and diltiazem stopped.  She underwent successful atrial flutter TEE/DCCV on 12/25/17.  Later went back in A fib. Toprol DC'd with ongoing bradycardia. She later placed on low dose carvedilol.   Intolerant Entresto due to chest pain.    Admitted 3/21 for iron-def anemia. Hgb 5.4.  She was started on IV Protonix.  GI was consulted.  Underwent EGD/colonoscopy which were unremarkable and capsule endoscopy which showed duodenal ulcer with bleeding and stigmata.  GI recommended oral Protonix and outpatient follow-up.  Admitted 01/18/21 for AV node ablation and PPM due to uncontrolled heart rates with A fib. Underwent Biotronix PPM. Toprol XL and digoxin stopped. Carvedilol 25 mg bid started. She was discharged 01/19/21.   She called Dr Forde Dandy office 02/04/21 due to headache after  starting carvedilol. Carvedilol was stopped and she was switched back to Toprol XL.    Admitted [birth year] with a/c systolic heart failure. Diuresed with IV lasix and transitioned to torsemide 40 mg daily. Echo completed and showed EF 25-30% with severe MR/TR.    Today she returns for HF follow up. Main complaint is she feels like she has a UTI. She is more SOB.  Blood sugars have been elevated. Denies  CP, dizziness, edema, or PND/Orthopnea. Appetite ok, has been drinking Gatorades. No fever or chills. Not checking weight regularly at home. Taking all medications. Smoking 1/2 cig/day. Had cataract surgery 3 weeks on left eye.  Cardiac Studies   Echo 3/16 EF 35-40%. Echo 10/17 EF improved to 50-55%. Echo 5/18 EF stable 50-55% Echo 3/19 LVEF 30-35%, Moderate LVH, Mild AI, Moderate regurgitation, Severe LAE, Moderately reduced RV, Severe RAE, PA peak pressure 50 mm Hg.   (Decreased from Echo 01/11/2017 with LVEF 50-55%) Echo 12/19 Ef 30-35% RV mildly dilated. Echo 4/21 EF 55-60%  Echo 11/21 EF 30-35% RV normal  Echo 4/22 EF 25-30% RV moderately reduced  Echo 6/22 EF 25-30% RV severely reduced  Stress Test 1/20- Low Risk EF 44%  ROS: All systems negative except as listed in HPI, PMH and Problem List.  SH:  Social History   Socioeconomic History   Marital status: Widowed    Spouse name: Not on file  Number of children: 1   Years of education: 11   Highest education level: Not on file  Occupational History   Occupation: RETIRED [employer]  Tobacco Use   Smoking status: Every Day    Packs/day: 0.50    Years: 56.00    Pack years: 28.00    Types: Cigarettes   Smokeless tobacco: Never   Tobacco comments:    smoking 2 cigarettes/day as of 10/23/20  Vaping Use   Vaping Use: Never used  Substance and Sexual Activity   Alcohol use: No    Alcohol/week: 0.0 standard drinks   Drug use: No   Sexual activity: Not on file  Other Topics Concern   Not on file  Social History  Narrative   Patient reports it being difficult to pay for everything due to outstanding medical bills from hospital stay last year but states she is able to keep up with basic expenses.  Patient does have some concerns with her house's condition due to a water leak- had her roof repaired last month but has some leaking in the front which has affected her porch.  Patient owns her own car and is able to drive herself but has some concerns about the reliability of her car- has gotten taxi to the clinic for appointment in the past when her car broke down.   Social Determinants of Health   Financial Resource Strain: Not on file  Food Insecurity: Not on file  Transportation Needs: Not on file  Physical Activity: Not on file  Stress: Not on file  Social Connections: Not on file  Intimate Partner Violence: Not on file   FH:  Family History  Problem Relation Age of Onset   Cancer Mother        LYMPHOMA   Heart disease Father    Other Father        TB   CVA Sister    Prostate cancer Brother 39   Diabetes Brother    Past Medical History:  Diagnosis Date   ([place]) heart failure with improved ejection fraction ([employer])    a. 07/2018 Echo: EF 30-35%; b. 11/2019 Echo: EF 55-60%, no rwma, Gr2 DD, Nl RV size/fxn. Mild BAE. Mild MR/AI.   Arthritis    Asthma    Atopic dermatitis    CAD (coronary artery disease)    a. 2016 s/p CABG x 2 (LIMA->LAD, VG->OM); b. 08/2018 MV: EF 44%, no ischemia/infact.   Cardiac arrest ([place]) 10/2014   Cardiomyopathy, ischemic    a. 07/2018 Echo: EF 30-35%; 11/2019 Echo: EF 55-60%.   Carotid arterial disease ([place])    a. 11/2019 Carotid U/S   CKD (chronic kidney disease), stage III (HCC)    COPD (chronic obstructive pulmonary disease) ([place])    Esophageal dilatation 2013   GERD (gastroesophageal reflux disease)    Gout    Headache    Hypertension    Hypokalemia    Idiopathic angioedema    LGI bleed 08/06/2017   a. felt to be hemorrhoidal during that admission (no  drop in Hgb).   Lower back pain    Paroxysmal atrial fibrillation (HCC)    a. Dx 2016-->h/o difficult to control rates (complicated by noncompliance), not felt to be a candidate for ablation or antiarrhythmic due to noncompliance; b. Recurrent AF 2021 - converted w/ IV dilt; c. CHA2DS2VASc = 7-->Xarelto.   Personal history of noncompliance with medical treatment, presenting hazards to health    Prediabetes    S/P CABG x 2  with clipping of LA appendage 11/14/2014   LIMA to LAD, SVG to OM, EVH via right thigh   Urine incontinence    Uterine fibroid    Current Outpatient Medications  Medication Sig Dispense Refill   acetaminophen (TYLENOL) 325 MG tablet Take 2 tablets (650 mg total) by mouth every 4 (four) hours as needed for headache or mild pain.     allopurinol (ZYLOPRIM) 300 MG tablet Take 1 tablet (300 mg total) by mouth daily. 30 tablet 11   atorvastatin (LIPITOR) 10 MG tablet Take 10 mg by mouth at bedtime.     losartan (COZAAR) 25 MG tablet Take 1 tablet (25 mg total) by mouth daily. 90 tablet 3   meclizine (ANTIVERT) 12.5 MG tablet Take 1 tablet (12.5 mg total) by mouth every 6 (six) hours as needed for dizziness. 30 tablet 0   metFORMIN (GLUCOPHAGE) 500 MG tablet Take 500 mg by mouth daily with breakfast. Daily with a meal     metoprolol succinate (TOPROL-XL) 100 MG 24 hr tablet Take 1 tablet (100 mg total) by mouth daily. Take with or immediately following a meal. 90 tablet 3   nystatin (MYCOSTATIN/NYSTOP) powder Apply 1 application topically 3 (three) times daily. 90 g 3   pantoprazole (PROTONIX) 40 MG tablet Take 40 mg by mouth daily.     rivaroxaban (XARELTO) 20 MG TABS tablet Take 1 tablet (20 mg total) by mouth daily with supper. Resume from 11/01/2019     Tiotropium Bromide Monohydrate (SPIRIVA RESPIMAT) 2.5 MCG/ACT AERS Inhale 2 puffs into the lungs daily. 1 g 5   torsemide (DEMADEX) 20 MG tablet Take 40 mg by mouth daily.     No current facility-administered medications for  this encounter.   BP (!) 164/98   Pulse 72   Wt 89.7 kg   SpO2 98%   BMI 30.98 kg/m   Wt Readings from Last 3 Encounters:  06/04/21 89.7 kg  05/18/21 84.7 kg  04/30/21 82.3 kg   PHYSICAL EXAM: General:  NAD. No resp difficulty HEENT: Normal Neck: Supple. No JVD. Carotids 2+ bilat; no bruits. No lymphadenopathy or thryomegaly appreciated. Cor: PMI nondisplaced. Irregular rate & rhythm. No rubs, gallops or murmurs. Lungs: Clear Abdomen: Obese, nontender, nondistended. No hepatosplenomegaly. No bruits or masses. Good bowel sounds. Extremities: No cyanosis, clubbing, rash, trace LE edema Neuro: Alert & oriented x 3, cranial nerves grossly intact. Moves all 4 extremities w/o difficulty. Affect pleasant.  ReDs: 33%  ASSESSMENT & PLAN:  1. Chronic Combined /Diastolic Systolic HF: - Suspect etiology is combined Ischemic/NICM with AF. - EF (2021): improved to 55-60% but back down  - Echo (6/22): EF 25-30%, severed LV dysfunction, RV severely down, severe MR. - NYHA II. Volume status ok today on exam, weight up 10 lbs, ReDs 33%. - Restart spiro 12.5 mg daily. - Continue torsemide 40 mg daily. - Continue Toprol XL 100 mg daily, dose recently reduced by EP due to soft BP (intolerant to Coreg due to HA). - Continue losartan 25 mg daily (Intolerant Entresto).  - No SGLT2i with candidal infection. - BMET today, repeat in 1 week.  2. Permanent A fib - S/P AV node ablation and PPM implant.  - On Xarelto. No bleeding issues. - Recent CBC stable.  3. CAD - s/p CABG x 2 2016 (LIMA to LAD and SVG to OM) - Stress Test 08/2018 low risk. EF 44%.   - No s/s ischemia. - Continue statin.  4. HTN  - Elevated today, she has  not taken her medications yet. - Add spiro as above.  5. CKD Stage III - Off SGLT2i with yeast. - Labs today.  6. Tobacco Abuse - Discussed cessation.  7. Dysuria - We discussed obtaining u/a however she is unable to provide a sample today. - Needs to follow up  with PCP.  She has been referred to Paramedicine, however they have been unable to have reliable contact with patient and she has thus been discharged.   Follow up with [name] + echo as scheduled.  Maricela Bo Danira Nylander FNP-BC 12:23 PM

## 2021-06-04 ENCOUNTER — Encounter (HOSPITAL_COMMUNITY): Payer: Self-pay

## 2021-06-04 ENCOUNTER — Ambulatory Visit (HOSPITAL_COMMUNITY)
Admission: RE | Admit: 2021-06-04 | Discharge: 2021-06-04 | Disposition: A | Discharge status: Home / Self Care | Payer: HMO | Source: Ambulatory Visit | Attending: Cardiology | Admitting: Cardiology

## 2021-06-04 ENCOUNTER — Other Ambulatory Visit: Payer: Self-pay

## 2021-06-04 VITALS — BP 164/98 | HR 72 | Wt 197.8 lb

## 2021-06-04 DIAGNOSIS — I10 Essential (primary) hypertension: Secondary | ICD-10-CM

## 2021-06-04 DIAGNOSIS — I4821 Permanent atrial fibrillation: Secondary | ICD-10-CM | POA: Insufficient documentation

## 2021-06-04 DIAGNOSIS — Z720 Tobacco use: Secondary | ICD-10-CM

## 2021-06-04 DIAGNOSIS — Z8249 Family history of ischemic heart disease and other diseases of the circulatory system: Secondary | ICD-10-CM | POA: Insufficient documentation

## 2021-06-04 DIAGNOSIS — E785 Hyperlipidemia, unspecified: Secondary | ICD-10-CM | POA: Diagnosis not present

## 2021-06-04 DIAGNOSIS — I428 Other cardiomyopathies: Secondary | ICD-10-CM | POA: Diagnosis not present

## 2021-06-04 DIAGNOSIS — Z79899 Other long term (current) drug therapy: Secondary | ICD-10-CM | POA: Diagnosis not present

## 2021-06-04 DIAGNOSIS — I130 Hypertensive heart and chronic kidney disease with heart failure and stage 1 through stage 4 chronic kidney disease, or unspecified chronic kidney disease: Secondary | ICD-10-CM | POA: Insufficient documentation

## 2021-06-04 DIAGNOSIS — Z7901 Long term (current) use of anticoagulants: Secondary | ICD-10-CM | POA: Diagnosis not present

## 2021-06-04 DIAGNOSIS — N1832 Chronic kidney disease, stage 3b: Secondary | ICD-10-CM

## 2021-06-04 DIAGNOSIS — N183 Chronic kidney disease, stage 3 unspecified: Secondary | ICD-10-CM | POA: Insufficient documentation

## 2021-06-04 DIAGNOSIS — R0602 Shortness of breath: Secondary | ICD-10-CM | POA: Diagnosis not present

## 2021-06-04 DIAGNOSIS — R7303 Prediabetes: Secondary | ICD-10-CM | POA: Diagnosis not present

## 2021-06-04 DIAGNOSIS — R300 Dysuria: Secondary | ICD-10-CM | POA: Diagnosis not present

## 2021-06-04 DIAGNOSIS — I5042 Chronic combined systolic (congestive) and diastolic (congestive) heart failure: Secondary | ICD-10-CM | POA: Diagnosis not present

## 2021-06-04 DIAGNOSIS — I255 Ischemic cardiomyopathy: Secondary | ICD-10-CM | POA: Diagnosis not present

## 2021-06-04 DIAGNOSIS — Z7984 Long term (current) use of oral hypoglycemic drugs: Secondary | ICD-10-CM | POA: Diagnosis not present

## 2021-06-04 DIAGNOSIS — I4819 Other persistent atrial fibrillation: Secondary | ICD-10-CM | POA: Diagnosis not present

## 2021-06-04 DIAGNOSIS — Z951 Presence of aortocoronary bypass graft: Secondary | ICD-10-CM | POA: Insufficient documentation

## 2021-06-04 DIAGNOSIS — I2510 Atherosclerotic heart disease of native coronary artery without angina pectoris: Secondary | ICD-10-CM | POA: Insufficient documentation

## 2021-06-04 DIAGNOSIS — F17210 Nicotine dependence, cigarettes, uncomplicated: Secondary | ICD-10-CM | POA: Insufficient documentation

## 2021-06-04 LAB — BASIC METABOLIC PANEL
Anion gap: 6 (ref 5–15)
BUN: 21 mg/dL (ref 8–23)
CO2: 22 mmol/L (ref 22–32)
Calcium: 9.3 mg/dL (ref 8.9–10.3)
Chloride: 112 mmol/L — ABNORMAL HIGH (ref 98–111)
Creatinine, Ser: 1.03 mg/dL — ABNORMAL HIGH (ref 0.44–1.00)
GFR, Estimated: 56 mL/min — ABNORMAL LOW (ref >60)
Glucose, Bld: 128 mg/dL — ABNORMAL HIGH (ref 70–99)
Potassium: 3.6 mmol/L (ref 3.5–5.1)
Sodium: 140 mmol/L (ref 135–145)

## 2021-06-04 MED ORDER — SPIRONOLACTONE 25 MG PO TABS: 12.5000 mg | ORAL_TABLET | Freq: Every day | ORAL | 6 refills | Status: DC

## 2021-06-04 NOTE — Progress Notes (Signed)
ReDS Vest / Clip - 06/04/21 1200       ReDS Vest / Clip   Station Marker C    Ruler Value 26.5    ReDS Value Range Low volume    ReDS Actual Value 33

## 2021-06-04 NOTE — Patient Instructions (Signed)
Restart Spironolactone 12.5 mg (1/2 tab) Daily  Labs done today, we will call you with abnormal results  Your physician recommends that you return for lab work in: 1-2 weeks  Your physician recommends that you schedule a follow-up appointment in: 2 months with echocardiogram (07/30/21)  Do the following things EVERYDAY: Weigh yourself in the morning before breakfast. Write it down and keep it in a log. Take your medicines as prescribed Eat low salt foods--Limit salt (sodium) to 2000 mg per day.  Stay as active as you can everyday Limit all fluids for the day to less than 2 liters  If you have any questions or concerns before your next appointment please send [place] a message through [place] or call our office at [phone].    TO LEAVE A MESSAGE FOR THE NURSE SELECT OPTION 2, PLEASE LEAVE A MESSAGE INCLUDING: YOUR NAME DATE OF BIRTH CALL BACK NUMBER REASON FOR CALL**this is important as we prioritize the call backs  YOU WILL RECEIVE A CALL BACK THE SAME DAY AS LONG AS YOU CALL BEFORE 4:00 PM  At the [facility], you and your health needs are our priority. As part of our continuing mission to provide you with exceptional heart care, we have created designated Provider Care Teams. These Care Teams include your primary Cardiologist (physician) and Advanced Practice Providers (APPs- Physician Assistants and Nurse Practitioners) who all work together to provide you with the care you need, when you need it.   You may see any of the following providers on your designated Care Team at your next follow up: Dr [name] Dr Haynes Kerns, NP Lyda Jester, [place] [facility] [place], [place] Audry Riles, PharmD   Please be sure to bring in all your medications bottles to every appointment.

## 2021-06-08 ENCOUNTER — Emergency Department (HOSPITAL_COMMUNITY): Payer: HMO

## 2021-06-08 ENCOUNTER — Inpatient Hospital Stay (HOSPITAL_COMMUNITY)
Admission: EM | Admit: 2021-06-08 | Discharge: 2021-06-10 | DRG: 291 | Disposition: A | Discharge status: Home / Self Care | Payer: HMO | Attending: Student | Admitting: Student

## 2021-06-08 ENCOUNTER — Other Ambulatory Visit: Payer: Self-pay

## 2021-06-08 DIAGNOSIS — I255 Ischemic cardiomyopathy: Secondary | ICD-10-CM | POA: Diagnosis not present

## 2021-06-08 DIAGNOSIS — I5043 Acute on chronic combined systolic (congestive) and diastolic (congestive) heart failure: Secondary | ICD-10-CM | POA: Diagnosis not present

## 2021-06-08 DIAGNOSIS — Z79899 Other long term (current) drug therapy: Secondary | ICD-10-CM

## 2021-06-08 DIAGNOSIS — Z8674 Personal history of sudden cardiac arrest: Secondary | ICD-10-CM

## 2021-06-08 DIAGNOSIS — E1165 Type 2 diabetes mellitus with hyperglycemia: Secondary | ICD-10-CM | POA: Diagnosis not present

## 2021-06-08 DIAGNOSIS — R7989 Other specified abnormal findings of blood chemistry: Secondary | ICD-10-CM | POA: Diagnosis present

## 2021-06-08 DIAGNOSIS — Z91030 Bee allergy status: Secondary | ICD-10-CM

## 2021-06-08 DIAGNOSIS — I081 Rheumatic disorders of both mitral and tricuspid valves: Secondary | ICD-10-CM | POA: Diagnosis present

## 2021-06-08 DIAGNOSIS — F17200 Nicotine dependence, unspecified, uncomplicated: Secondary | ICD-10-CM | POA: Diagnosis not present

## 2021-06-08 DIAGNOSIS — I5042 Chronic combined systolic (congestive) and diastolic (congestive) heart failure: Secondary | ICD-10-CM

## 2021-06-08 DIAGNOSIS — Z7984 Long term (current) use of oral hypoglycemic drugs: Secondary | ICD-10-CM

## 2021-06-08 DIAGNOSIS — I130 Hypertensive heart and chronic kidney disease with heart failure and stage 1 through stage 4 chronic kidney disease, or unspecified chronic kidney disease: Secondary | ICD-10-CM | POA: Diagnosis not present

## 2021-06-08 DIAGNOSIS — E669 Obesity, unspecified: Secondary | ICD-10-CM | POA: Diagnosis not present

## 2021-06-08 DIAGNOSIS — I5023 Acute on chronic systolic (congestive) heart failure: Secondary | ICD-10-CM | POA: Diagnosis present

## 2021-06-08 DIAGNOSIS — Z20822 Contact with and (suspected) exposure to covid-19: Secondary | ICD-10-CM | POA: Diagnosis present

## 2021-06-08 DIAGNOSIS — M1990 Unspecified osteoarthritis, unspecified site: Secondary | ICD-10-CM | POA: Diagnosis present

## 2021-06-08 DIAGNOSIS — I2510 Atherosclerotic heart disease of native coronary artery without angina pectoris: Secondary | ICD-10-CM | POA: Diagnosis present

## 2021-06-08 DIAGNOSIS — Z951 Presence of aortocoronary bypass graft: Secondary | ICD-10-CM

## 2021-06-08 DIAGNOSIS — R0602 Shortness of breath: Secondary | ICD-10-CM | POA: Diagnosis not present

## 2021-06-08 DIAGNOSIS — Z880 Allergy status to penicillin: Secondary | ICD-10-CM

## 2021-06-08 DIAGNOSIS — Z8711 Personal history of peptic ulcer disease: Secondary | ICD-10-CM

## 2021-06-08 DIAGNOSIS — M109 Gout, unspecified: Secondary | ICD-10-CM | POA: Diagnosis present

## 2021-06-08 DIAGNOSIS — I160 Hypertensive urgency: Secondary | ICD-10-CM | POA: Diagnosis present

## 2021-06-08 DIAGNOSIS — I517 Cardiomegaly: Secondary | ICD-10-CM | POA: Diagnosis not present

## 2021-06-08 DIAGNOSIS — R778 Other specified abnormalities of plasma proteins: Secondary | ICD-10-CM | POA: Diagnosis not present

## 2021-06-08 DIAGNOSIS — Z833 Family history of diabetes mellitus: Secondary | ICD-10-CM | POA: Diagnosis not present

## 2021-06-08 DIAGNOSIS — N1831 Chronic kidney disease, stage 3a: Secondary | ICD-10-CM | POA: Diagnosis present

## 2021-06-08 DIAGNOSIS — E1122 Type 2 diabetes mellitus with diabetic chronic kidney disease: Secondary | ICD-10-CM | POA: Diagnosis present

## 2021-06-08 DIAGNOSIS — Z91013 Allergy to seafood: Secondary | ICD-10-CM

## 2021-06-08 DIAGNOSIS — Z91041 Radiographic dye allergy status: Secondary | ICD-10-CM

## 2021-06-08 DIAGNOSIS — J441 Chronic obstructive pulmonary disease with (acute) exacerbation: Secondary | ICD-10-CM | POA: Diagnosis present

## 2021-06-08 DIAGNOSIS — I340 Nonrheumatic mitral (valve) insufficiency: Secondary | ICD-10-CM | POA: Diagnosis not present

## 2021-06-08 DIAGNOSIS — Z91138 Patient's unintentional underdosing of medication regimen for other reason: Secondary | ICD-10-CM | POA: Diagnosis not present

## 2021-06-08 DIAGNOSIS — Z8249 Family history of ischemic heart disease and other diseases of the circulatory system: Secondary | ICD-10-CM

## 2021-06-08 DIAGNOSIS — Z888 Allergy status to other drugs, medicaments and biological substances status: Secondary | ICD-10-CM

## 2021-06-08 DIAGNOSIS — I4821 Permanent atrial fibrillation: Secondary | ICD-10-CM | POA: Diagnosis present

## 2021-06-08 DIAGNOSIS — J449 Chronic obstructive pulmonary disease, unspecified: Secondary | ICD-10-CM | POA: Diagnosis present

## 2021-06-08 DIAGNOSIS — D696 Thrombocytopenia, unspecified: Secondary | ICD-10-CM | POA: Diagnosis present

## 2021-06-08 DIAGNOSIS — I2583 Coronary atherosclerosis due to lipid rich plaque: Secondary | ICD-10-CM

## 2021-06-08 DIAGNOSIS — I248 Other forms of acute ischemic heart disease: Secondary | ICD-10-CM | POA: Diagnosis present

## 2021-06-08 DIAGNOSIS — E785 Hyperlipidemia, unspecified: Secondary | ICD-10-CM | POA: Diagnosis present

## 2021-06-08 DIAGNOSIS — Z6830 Body mass index (BMI) 30.0-30.9, adult: Secondary | ICD-10-CM

## 2021-06-08 DIAGNOSIS — K219 Gastro-esophageal reflux disease without esophagitis: Secondary | ICD-10-CM | POA: Diagnosis present

## 2021-06-08 DIAGNOSIS — Z7901 Long term (current) use of anticoagulants: Secondary | ICD-10-CM

## 2021-06-08 DIAGNOSIS — N179 Acute kidney failure, unspecified: Secondary | ICD-10-CM | POA: Diagnosis not present

## 2021-06-08 DIAGNOSIS — T501X6A Underdosing of loop [high-ceiling] diuretics, initial encounter: Secondary | ICD-10-CM | POA: Diagnosis not present

## 2021-06-08 DIAGNOSIS — I071 Rheumatic tricuspid insufficiency: Secondary | ICD-10-CM | POA: Diagnosis not present

## 2021-06-08 DIAGNOSIS — F17210 Nicotine dependence, cigarettes, uncomplicated: Secondary | ICD-10-CM | POA: Diagnosis not present

## 2021-06-08 DIAGNOSIS — Z950 Presence of cardiac pacemaker: Secondary | ICD-10-CM

## 2021-06-08 DIAGNOSIS — E66811 Obesity, class 1: Secondary | ICD-10-CM | POA: Diagnosis present

## 2021-06-08 DIAGNOSIS — Z885 Allergy status to narcotic agent status: Secondary | ICD-10-CM

## 2021-06-08 HISTORY — DX: Type 2 diabetes mellitus without complications: E11.9

## 2021-06-08 HISTORY — DX: Cardiac arrhythmia, unspecified: I49.9

## 2021-06-08 HISTORY — DX: Presence of cardiac pacemaker: Z95.0

## 2021-06-08 HISTORY — DX: Heart failure, unspecified: I50.9

## 2021-06-08 LAB — TROPONIN I (HIGH SENSITIVITY)
Troponin I (High Sensitivity): 23 ng/L — ABNORMAL HIGH (ref <18)
Troponin I (High Sensitivity): 23 ng/L — ABNORMAL HIGH (ref <18)

## 2021-06-08 LAB — BASIC METABOLIC PANEL
Anion gap: 11 (ref 5–15)
BUN: 22 mg/dL (ref 8–23)
CO2: 18 mmol/L — ABNORMAL LOW (ref 22–32)
Calcium: 9.4 mg/dL (ref 8.9–10.3)
Chloride: 113 mmol/L — ABNORMAL HIGH (ref 98–111)
Creatinine, Ser: 1.15 mg/dL — ABNORMAL HIGH (ref 0.44–1.00)
GFR, Estimated: 49 mL/min — ABNORMAL LOW (ref >60)
Glucose, Bld: 112 mg/dL — ABNORMAL HIGH (ref 70–99)
Potassium: 3.7 mmol/L (ref 3.5–5.1)
Sodium: 142 mmol/L (ref 135–145)

## 2021-06-08 LAB — RESP PANEL BY RT-PCR (FLU A&B, COVID) ARPGX2
Influenza A by PCR: NEGATIVE
Influenza B by PCR: NEGATIVE
SARS Coronavirus 2 by RT PCR: NEGATIVE

## 2021-06-08 LAB — CBC
HCT: 46.3 % — ABNORMAL HIGH (ref 36.0–46.0)
Hemoglobin: 14.2 g/dL (ref 12.0–15.0)
MCH: 26.6 pg (ref 26.0–34.0)
MCHC: 30.7 g/dL (ref 30.0–36.0)
MCV: 86.9 fL (ref 80.0–100.0)
Platelets: 110 10*3/uL — ABNORMAL LOW (ref 150–400)
RBC: 5.33 MIL/uL — ABNORMAL HIGH (ref 3.87–5.11)
RDW: 22.8 % — ABNORMAL HIGH (ref 11.5–15.5)
WBC: 7 10*3/uL (ref 4.0–10.5)
nRBC: 0 % (ref 0.0–0.2)

## 2021-06-08 LAB — GLUCOSE, CAPILLARY: Glucose-Capillary: 174 mg/dL — ABNORMAL HIGH (ref 70–99)

## 2021-06-08 LAB — I-STAT VENOUS BLOOD GAS, ED
Acid-base deficit: 4 mmol/L — ABNORMAL HIGH (ref 0.0–2.0)
Bicarbonate: 18.4 mmol/L — ABNORMAL LOW (ref 20.0–28.0)
Calcium, Ion: 1.12 mmol/L — ABNORMAL LOW (ref 1.15–1.40)
HCT: 47 % — ABNORMAL HIGH (ref 36.0–46.0)
Hemoglobin: 16 g/dL — ABNORMAL HIGH (ref 12.0–15.0)
O2 Saturation: 87 %
Potassium: 4.1 mmol/L (ref 3.5–5.1)
Sodium: 144 mmol/L (ref 135–145)
TCO2: 19 mmol/L — ABNORMAL LOW (ref 22–32)
pCO2, Ven: 27 mmHg — ABNORMAL LOW (ref 44.0–60.0)
pH, Ven: 7.442 — ABNORMAL HIGH (ref 7.250–7.430)
pO2, Ven: 49 mmHg — ABNORMAL HIGH (ref 32.0–45.0)

## 2021-06-08 LAB — CBG MONITORING, ED: Glucose-Capillary: 210 mg/dL — ABNORMAL HIGH (ref 70–99)

## 2021-06-08 LAB — BRAIN NATRIURETIC PEPTIDE: B Natriuretic Peptide: 1220.7 pg/mL — ABNORMAL HIGH (ref 0.0–100.0)

## 2021-06-08 MED ORDER — SPIRONOLACTONE 12.5 MG HALF TABLET
12.5000 mg | ORAL_TABLET | Freq: Every day | ORAL | Status: DC
  Filled 2021-06-08: qty 1

## 2021-06-08 MED ORDER — POTASSIUM CHLORIDE CRYS ER 20 MEQ PO TBCR
40.0000 meq | EXTENDED_RELEASE_TABLET | Freq: Once | ORAL | Status: DC
  Filled 2021-06-08: qty 2

## 2021-06-08 MED ORDER — ALBUTEROL SULFATE (2.5 MG/3ML) 0.083% IN NEBU: 2.5000 mg | INHALATION_SOLUTION | RESPIRATORY_TRACT | Status: DC | PRN

## 2021-06-08 MED ORDER — METOPROLOL SUCCINATE ER 100 MG PO TB24
100.0000 mg | ORAL_TABLET | Freq: Every day | ORAL | Status: DC
  Administered 2021-06-09 – 2021-06-10 (×2): 100 mg via ORAL
  Filled 2021-06-08 (×2): qty 1
  Filled 2021-06-08: qty 4
  Filled 2021-06-08: qty 1

## 2021-06-08 MED ORDER — METHYLPREDNISOLONE SODIUM SUCC 125 MG IJ SOLR
125.0000 mg | Freq: Once | INTRAMUSCULAR | Status: AC
  Administered 2021-06-08: 125 mg via INTRAVENOUS
  Filled 2021-06-08: qty 2

## 2021-06-08 MED ORDER — RIVAROXABAN 20 MG PO TABS
20.0000 mg | ORAL_TABLET | Freq: Every day | ORAL | Status: DC
  Administered 2021-06-08 – 2021-06-09 (×2): 20 mg via ORAL
  Filled 2021-06-08: qty 1
  Filled 2021-06-08: qty 2

## 2021-06-08 MED ORDER — ACETAMINOPHEN 650 MG RE SUPP: 650.0000 mg | Freq: Four times a day (QID) | RECTAL | Status: DC | PRN

## 2021-06-08 MED ORDER — SODIUM CHLORIDE 0.9 % IV SOLN: 250.0000 mL | INTRAVENOUS | Status: DC | PRN

## 2021-06-08 MED ORDER — INFLUENZA VAC A&B SA ADJ QUAD 0.5 ML IM PRSY
0.5000 mL | PREFILLED_SYRINGE | INTRAMUSCULAR | Status: DC
  Filled 2021-06-08: qty 0.5

## 2021-06-08 MED ORDER — PREDNISONE 20 MG PO TABS
40.0000 mg | ORAL_TABLET | Freq: Every day | ORAL | Status: DC
  Administered 2021-06-09 – 2021-06-10 (×2): 40 mg via ORAL
  Filled 2021-06-08 (×2): qty 2

## 2021-06-08 MED ORDER — INSULIN ASPART 100 UNIT/ML IJ SOLN
0.0000 [IU] | Freq: Three times a day (TID) | INTRAMUSCULAR | Status: DC
  Administered 2021-06-08: 3 [IU] via SUBCUTANEOUS
  Administered 2021-06-09 (×3): 2 [IU] via SUBCUTANEOUS

## 2021-06-08 MED ORDER — IPRATROPIUM-ALBUTEROL 0.5-2.5 (3) MG/3ML IN SOLN
3.0000 mL | Freq: Four times a day (QID) | RESPIRATORY_TRACT | Status: DC
  Administered 2021-06-08 – 2021-06-09 (×2): 3 mL via RESPIRATORY_TRACT
  Filled 2021-06-08 (×2): qty 3

## 2021-06-08 MED ORDER — SODIUM CHLORIDE 0.9% FLUSH
3.0000 mL | Freq: Two times a day (BID) | INTRAVENOUS | Status: DC
  Administered 2021-06-08 – 2021-06-10 (×4): 3 mL via INTRAVENOUS

## 2021-06-08 MED ORDER — HYDRALAZINE HCL 25 MG PO TABS: 25.0000 mg | ORAL_TABLET | Freq: Four times a day (QID) | ORAL | Status: DC | PRN

## 2021-06-08 MED ORDER — ONDANSETRON HCL 4 MG/2ML IJ SOLN: 4.0000 mg | Freq: Four times a day (QID) | INTRAMUSCULAR | Status: DC | PRN

## 2021-06-08 MED ORDER — FUROSEMIDE 10 MG/ML IJ SOLN
60.0000 mg | Freq: Two times a day (BID) | INTRAMUSCULAR | Status: DC
  Administered 2021-06-08 – 2021-06-09 (×2): 60 mg via INTRAVENOUS
  Filled 2021-06-08 (×3): qty 6

## 2021-06-08 MED ORDER — ALLOPURINOL 300 MG PO TABS
300.0000 mg | ORAL_TABLET | Freq: Every day | ORAL | Status: DC
  Administered 2021-06-09 – 2021-06-10 (×2): 300 mg via ORAL
  Filled 2021-06-08: qty 3
  Filled 2021-06-08 (×2): qty 1

## 2021-06-08 MED ORDER — ACETAMINOPHEN 325 MG PO TABS: 650.0000 mg | ORAL_TABLET | Freq: Four times a day (QID) | ORAL | Status: DC | PRN

## 2021-06-08 MED ORDER — IPRATROPIUM-ALBUTEROL 0.5-2.5 (3) MG/3ML IN SOLN
3.0000 mL | Freq: Once | RESPIRATORY_TRACT | Status: AC
  Administered 2021-06-08: 3 mL via RESPIRATORY_TRACT
  Filled 2021-06-08: qty 3

## 2021-06-08 MED ORDER — SODIUM CHLORIDE 0.9% FLUSH: 3.0000 mL | Freq: Two times a day (BID) | INTRAVENOUS | Status: DC

## 2021-06-08 MED ORDER — SODIUM CHLORIDE 0.9% FLUSH: 3.0000 mL | INTRAVENOUS | Status: DC | PRN

## 2021-06-08 MED ORDER — SODIUM CHLORIDE 0.9 % IV SOLN
1.0000 g | INTRAVENOUS | Status: DC
  Administered 2021-06-08 – 2021-06-09 (×2): 1 g via INTRAVENOUS
  Filled 2021-06-08 (×2): qty 10

## 2021-06-08 MED ORDER — PANTOPRAZOLE SODIUM 40 MG PO TBEC
40.0000 mg | DELAYED_RELEASE_TABLET | Freq: Every day | ORAL | Status: DC
  Administered 2021-06-09 – 2021-06-10 (×2): 40 mg via ORAL
  Filled 2021-06-08 (×3): qty 1

## 2021-06-08 MED ORDER — ATORVASTATIN CALCIUM 10 MG PO TABS
10.0000 mg | ORAL_TABLET | Freq: Every day | ORAL | Status: DC
  Administered 2021-06-08 – 2021-06-09 (×2): 10 mg via ORAL
  Filled 2021-06-08 (×3): qty 1

## 2021-06-08 MED ORDER — LOSARTAN POTASSIUM 25 MG PO TABS
25.0000 mg | ORAL_TABLET | Freq: Every day | ORAL | Status: DC
  Administered 2021-06-09: 25 mg via ORAL
  Filled 2021-06-08 (×2): qty 1

## 2021-06-08 MED ORDER — SPIRONOLACTONE 25 MG PO TABS
25.0000 mg | ORAL_TABLET | Freq: Every day | ORAL | Status: DC
  Administered 2021-06-08 – 2021-06-10 (×3): 25 mg via ORAL
  Filled 2021-06-08 (×4): qty 1

## 2021-06-08 MED ORDER — FUROSEMIDE 10 MG/ML IJ SOLN
40.0000 mg | Freq: Once | INTRAMUSCULAR | Status: AC
  Administered 2021-06-08: 40 mg via INTRAVENOUS
  Filled 2021-06-08: qty 4

## 2021-06-08 NOTE — Progress Notes (Signed)
Heart Failure Navigator Progress Note  Assessed for Heart & Vascular TOC clinic readiness.  Patient does not meet criteria due to prior to hospitalization pt is established with AHF clinic. AHF rounding team consulted this admission.   Navigator available for reassessment of patient.   Pricilla Holm, MSN, RN Heart Failure Nurse Navigator [phone]

## 2021-06-08 NOTE — H&P (Addendum)
History and Physical    THERESEA TRAUTMANN BTD:[id number] DOB: February 12, 1946 DOA: 06/08/2021  Referring MD/NP/PA: Teressa Lower, MD PCP: [name], MD  Patient coming from: Home via EMS  Chief Complaint: Shortness of breath  I have personally briefly reviewed patient's old medical records in [place]   HPI: MONTINA DORRANCE is a 75 y.o. female with medical history significant of hypertension, hyperlipidemia, permanent A. fib, CAD s/p CABG, combined CHF last EF noted to be 25-30%, s/p PPM, and tobacco abuse presents with complaints of shortness of breath.  She had recently ran out of torsemide and Protonix 3 days ago, but states that her pharmacy likely delivered them today.  Patient complains of having increased work of breathing and wheezing for which she has been unable to rest at night.  She has felt that her heart has been going in and out of A. fib as the likely cause of her symptoms.  Other associated symptoms included intermittently productive cough, generalized weakness, and generalized myalgias including reports of intermittent chest pains.  At home she has what sounds like a nebulizer machine, but states that she does not know how to use it.  She use to have someone come out helped her with a, but they no longer come out.  Patient also admits that she still smoking a few cigarettes per day.  Ms. Sterba reports that she had taken her metoprolol and all other regularly scheduled medications this morning.  ED Course: Upon admission into the emergency department patient was seen to be afebrile, respirations 17-39, blood pressures elevated up to 188/112, and O2 saturations currently maintained on room air.  Labs significant for platelets 110, CO2 18, BUN 22, creatinine 1.15, and troponin 23-> 23.  Venous blood gas was obtained which noted PCO2 to be 27.  Chest x-ray noted diffuse peribronchial cuffing and widespread interstitial prominence similar to prior exams like above cardiac bronchitis  and cardiomegaly with left atrial dilation.  Cardiology have been consulted and saw patient stress clip to be 34% suggestive of mild fluid overload.  Patient had been given Solu-Medrol 125 mg IV, DuoNeb breathing treatment, and Lasix 40 mg IV.  Review of Systems  Constitutional:  Positive for malaise/fatigue. Negative for fever.  HENT:  Negative for congestion and sore throat.   Eyes:  Negative for photophobia and pain.  Respiratory:  Positive for cough, sputum production, shortness of breath and wheezing.   Cardiovascular:  Positive for chest pain, palpitations and orthopnea. Negative for leg swelling.  Gastrointestinal:  Negative for abdominal pain, nausea and vomiting.  Genitourinary:  Negative for dysuria and hematuria.  Musculoskeletal:  Positive for myalgias.  Skin:  Negative for rash.  Neurological:  Positive for weakness. Negative for loss of consciousness.  Psychiatric/Behavioral:  Positive for substance abuse. The patient has insomnia.    Past Medical History:  Diagnosis Date   ([place]) heart failure with improved ejection fraction ([place])    a. 07/2018 Echo: EF 30-35%; b. 11/2019 Echo: EF 55-60%, no rwma, Gr2 DD, Nl RV size/fxn. Mild BAE. Mild MR/AI.   Arthritis    Asthma    Atopic dermatitis    CAD (coronary artery disease)    a. 2016 s/p CABG x 2 (LIMA->LAD, VG->OM); b. 08/2018 MV: EF 44%, no ischemia/infact.   Cardiac arrest ([place]) 10/2014   Cardiomyopathy, ischemic    a. 07/2018 Echo: EF 30-35%; 11/2019 Echo: EF 55-60%.   Carotid arterial disease ([place])    a. 11/2019 Carotid U/S   CKD (  chronic kidney disease), stage III (HCC)    COPD (chronic obstructive pulmonary disease) ([place])    Esophageal dilatation 2013   GERD (gastroesophageal reflux disease)    Gout    Headache    Hypertension    Hypokalemia    Idiopathic angioedema    LGI bleed 08/06/2017   a. felt to be hemorrhoidal during that admission (no drop in Hgb).   Lower back pain    Paroxysmal atrial fibrillation  (HCC)    a. Dx 2016-->h/o difficult to control rates (complicated by noncompliance), not felt to be a candidate for ablation or antiarrhythmic due to noncompliance; b. Recurrent AF 2021 - converted w/ IV dilt; c. CHA2DS2VASc = 7-->Xarelto.   Personal history of noncompliance with medical treatment, presenting hazards to health    Prediabetes    S/P CABG x 2 with clipping of LA appendage 11/14/2014   LIMA to LAD, SVG to OM, EVH via right thigh   Urine incontinence    Uterine fibroid     Past Surgical History:  Procedure Laterality Date   AV NODE ABLATION N/A 01/18/2021   Procedure: AV NODE ABLATION;  Surgeon: Evans Lance, MD;  Location: [place] [facility];  Service: Cardiovascular;  Laterality: N/A;   BALLOON DILATION N/A 10/10/2019   Procedure: BALLOON DILATION;  Surgeon: Otis Brace, MD;  Location: [facility];  Service: Gastroenterology;  Laterality: N/A;   BIOPSY  10/10/2019   Procedure: BIOPSY;  Surgeon: Otis Brace, MD;  Location: [place];  Service: Gastroenterology;;   BIOPSY  10/11/2019   Procedure: BIOPSY;  Surgeon: Otis Brace, MD;  Location: [place];  Service: Gastroenterology;;   CARDIOVERSION N/A 11/18/2014   Procedure: CARDIOVERSION;  Surgeon: [name], MD;  Location: [place];  Service: Cardiovascular;  Laterality: N/A;   CARDIOVERSION N/A 12/25/2017   Procedure: CARDIOVERSION;  Surgeon: [name], MD;  Location: [place];  Service: Cardiovascular;  Laterality: N/A;   CLIPPING OF ATRIAL APPENDAGE N/A 11/14/2014   Procedure: CLIPPING OF ATRIAL APPENDAGE;  Surgeon: [name], MD;  Location: [place];  Service: Open Heart Surgery;  Laterality: N/A;   COLONOSCOPY  2013   COLONOSCOPY WITH PROPOFOL N/A 10/11/2019   Procedure: COLONOSCOPY WITH PROPOFOL;  Surgeon: Otis Brace, MD;  Location: [place];  Service: Gastroenterology;  Laterality: N/A;   CORONARY ARTERY BYPASS GRAFT N/A 11/14/2014   Procedure: CORONARY  ARTERY BYPASS GRAFTING (CABG)TIMES 2 USING LEFT INTERNAL MAMMARY ARTERY AND RIGHT SAPHENOUS VEIN HARVESTED ENDOSCOPICALLY;  Surgeon: [name], MD;  Location: [place];  Service: Open Heart Surgery;  Laterality: N/A;   ESOPHAGOGASTRODUODENOSCOPY (EGD) WITH PROPOFOL N/A 10/10/2019   Procedure: ESOPHAGOGASTRODUODENOSCOPY (EGD) WITH PROPOFOL;  Surgeon: Otis Brace, MD;  Location: [place];  Service: Gastroenterology;  Laterality: N/A;   ESOPHAGOGASTRODUODENOSCOPY (EGD) WITH PROPOFOL N/A 10/27/2019   Procedure: ESOPHAGOGASTRODUODENOSCOPY (EGD) WITH PROPOFOL;  Surgeon: Ronnette Juniper, MD;  Location: [place];  Service: Gastroenterology;  Laterality: N/A;   GIVENS CAPSULE STUDY N/A 10/27/2019   Procedure: GIVENS CAPSULE STUDY;  Surgeon: Ronnette Juniper, MD;  Location: [place];  Service: Gastroenterology;  Laterality: N/A;   LEFT HEART CATHETERIZATION WITH CORONARY ANGIOGRAM N/A 11/04/2014   Procedure: LEFT HEART CATHETERIZATION WITH CORONARY ANGIOGRAM;  Surgeon: Troy Sine, MD;  Location: [facility] C[facility] LAB;  Service: Cardiovascular;  Laterality: N/A;   PACEMAKER IMPLANT N/A 01/18/2021   Procedure: PACEMAKER IMPLANT;  Surgeon: Evans Lance, MD;  Location: [place] [facility];  Service: Cardiovascular;  Laterality: N/A;   PACEMAKER  IMPLANT Left    POLYPECTOMY  10/11/2019   Procedure: POLYPECTOMY;  Surgeon: Otis Brace, MD;  Location: [place] ENDOSCOPY;  Service: Gastroenterology;;   TEE WITHOUT CARDIOVERSION N/A 11/14/2014   Procedure: TRANSESOPHAGEAL ECHOCARDIOGRAM (TEE);  Surgeon: [name], MD;  Location: [place];  Service: Open Heart Surgery;  Laterality: N/A;   TEE WITHOUT CARDIOVERSION N/A 12/25/2017   Procedure: TRANSESOPHAGEAL ECHOCARDIOGRAM (TEE);  Surgeon: [name], MD;  Location: [facility] ENDOSCOPY;  Service: Cardiovascular;  Laterality: N/A;   TEMPORARY PACEMAKER INSERTION  11/04/2014   Procedure: TEMPORARY PACEMAKER INSERTION;  Surgeon: Troy Sine, MD;  Location:  [facility] CATH LAB;  Service: Cardiovascular;;   TOOTH EXTRACTION       reports that she has been smoking cigarettes. She has a 28.00 pack-year smoking history. She has never used smokeless tobacco. She reports that she does not drink alcohol and does not use drugs.  Allergies  Allergen Reactions   Bee Venom Anaphylaxis   Ivp Dye [Iodinated Diagnostic Agents] Anaphylaxis   Ace Inhibitors     Other reaction(s): angioedema   Atenolol     Other reaction(s): severe headaches   Codeine Nausea And Vomiting   Entresto [Sacubitril-Valsartan] Other (See Comments)    Chest pain    Penicillin G     Other reaction(s): rash   Shrimp [Shellfish Allergy] Swelling   Simvastatin     Other reaction(s): not sure   Jardiance [Empagliflozin] Other (See Comments)    Caused Boils     Family History  Problem Relation Age of Onset   Cancer Mother        LYMPHOMA   Heart disease Father    Other Father        TB   CVA Sister    Prostate cancer Brother 28   Diabetes Brother     Prior to Admission medications   Medication Sig Start Date End Date Taking? Authorizing Provider  acetaminophen (TYLENOL) 325 MG tablet Take 2 tablets (650 mg total) by mouth every 4 (four) hours as needed for headache or mild pain. 12/09/20   Mikhail, Velta Addison, DO  allopurinol (ZYLOPRIM) 300 MG tablet Take 1 tablet (300 mg total) by mouth daily. 01/04/18   Georgiana Shore, NP  atorvastatin (LIPITOR) 10 MG tablet Take 10 mg by mouth at bedtime. 01/22/21   [provider]  losartan (COZAAR) 25 MG tablet Take 1 tablet (25 mg total) by mouth daily. 10/19/20 03/11/22  Baldwin Jamaica, PA-C  meclizine (ANTIVERT) 12.5 MG tablet Take 1 tablet (12.5 mg total) by mouth every 6 (six) hours as needed for dizziness. 05/18/21   Milford, Maricela Bo, FNP  metFORMIN (GLUCOPHAGE) 500 MG tablet Take 500 mg by mouth daily with breakfast. Daily with a meal    [provider]  metoprolol succinate (TOPROL-XL) 100 MG 24 hr tablet Take 1  tablet (100 mg total) by mouth daily. Take with or immediately following a meal. 04/30/21 07/29/21  Evans Lance, MD  nystatin (MYCOSTATIN/NYSTOP) powder Apply 1 application topically 3 (three) times daily. 04/23/21   Milford, Maricela Bo, FNP  pantoprazole (PROTONIX) 40 MG tablet Take 40 mg by mouth daily.    [provider]  rivaroxaban (XARELTO) 20 MG TABS tablet Take 1 tablet (20 mg total) by mouth daily with supper. Resume from 11/01/2019 11/01/19   Aline August, MD  spironolactone (ALDACTONE) 25 MG tablet Take 0.5 tablets (12.5 mg total) by mouth daily. 06/04/21   Rafael Bihari, FNP  Tiotropium Bromide Monohydrate (  SPIRIVA RESPIMAT) 2.5 MCG/ACT AERS Inhale 2 puffs into the lungs daily. 10/23/20   Parrett, Fonnie Mu, NP  torsemide (DEMADEX) 20 MG tablet Take 40 mg by mouth daily. 04/19/21   [provider]    Physical Exam:  Constitutional: Elderly female who appears to be in respiratory distress Vitals:   06/08/21 1229 06/08/21 1230 06/08/21 1245 06/08/21 1338  BP: (!) 171/113 (!) 168/115 (!) 188/112 (!) 161/108  Pulse: 71 73 72 68  Resp: 20 (!) 35 (!) 39 (!) 24  Temp:      TempSrc:      SpO2: 95% 97% 91% 99%   Eyes: PERRL, lids and conjunctivae normal ENMT: Mucous membranes are moist. Posterior pharynx clear of any exudate or lesions.  Neck: normal, supple, no masses, no thyromegaly.  JVD appreciated. Respiratory: Tachypneic with decreased overall aeration and mild expiratory wheeze appreciated.  Currently maintaining O2 saturations on room air but patient only able to speak in short sentences. Cardiovascular: Irregular rate and rhythm.  At least +1 pitting bilateral lower edema.  Abdomen: no tenderness, no masses palpated. No hepatosplenomegaly. Bowel sounds positive.  Musculoskeletal: no clubbing / cyanosis. No joint deformity upper and lower extremities. Good ROM, no contractures. Normal muscle tone.  Skin: no rashes, lesions, ulcers. No induration Neurologic:  CN 2-12 grossly intact. Sensation intact, DTR normal. Strength 5/5 in all 4.  Psychiatric: Normal judgment and insight. Alert and oriented x 3. Normal mood.     Labs on Admission: I have personally reviewed following labs and imaging studies  CBC: Recent Labs  Lab 06/08/21 0704  WBC 7.0  HGB 14.2  HCT 46.3*  MCV 86.9  PLT 409*   Basic Metabolic Panel: Recent Labs  Lab 06/04/21 1244 06/08/21 0704  NA 140 142  K 3.6 3.7  CL 112* 113*  CO2 22 18*  GLUCOSE 128* 112*  BUN 21 22  CREATININE 1.03* 1.15*  CALCIUM 9.3 9.4   GFR: Estimated Creatinine Clearance: 47.8 mL/min (A) (by C-G formula based on SCr of 1.15 mg/dL (H)). Liver Function Tests: No results for input(s): AST, ALT, ALKPHOS, BILITOT, PROT, ALBUMIN in the last 168 hours. No results for input(s): LIPASE, AMYLASE in the last 168 hours. No results for input(s): AMMONIA in the last 168 hours. Coagulation Profile: No results for input(s): INR, PROTIME in the last 168 hours. Cardiac Enzymes: No results for input(s): CKTOTAL, CKMB, CKMBINDEX, TROPONINI in the last 168 hours. BNP (last 3 results) No results for input(s): PROBNP in the last 8760 hours. HbA1C: No results for input(s): HGBA1C in the last 72 hours. CBG: No results for input(s): GLUCAP in the last 168 hours. Lipid Profile: No results for input(s): CHOL, HDL, LDLCALC, TRIG, CHOLHDL, LDLDIRECT in the last 72 hours. Thyroid Function Tests: No results for input(s): TSH, T4TOTAL, FREET4, T3FREE, THYROIDAB in the last 72 hours. Anemia Panel: No results for input(s): VITAMINB12, FOLATE, FERRITIN, TIBC, IRON, RETICCTPCT in the last 72 hours. Urine analysis:    Component Value Date/Time   COLORURINE YELLOW 12/08/2020 0938   APPEARANCEUR CLEAR 12/08/2020 0938   LABSPEC 1.017 12/08/2020 0938   PHURINE 5.0 12/08/2020 0938   GLUCOSEU NEGATIVE 12/08/2020 0938   HGBUR NEGATIVE 12/08/2020 0938   BILIRUBINUR NEGATIVE 12/08/2020 0938   BILIRUBINUR ng 12/25/2013  1342   KETONESUR NEGATIVE 12/08/2020 0938   PROTEINUR 30 (A) 12/08/2020 0938   UROBILINOGEN 0.2 04/11/2017 1402   NITRITE NEGATIVE 12/08/2020 0938   LEUKOCYTESUR NEGATIVE 12/08/2020 0938   Sepsis Labs: No results found  for this or any previous visit (from the past 240 hour(s)).   Radiological Exams on Admission: DG Chest 2 View  Result Date: 06/08/2021 CLINICAL DATA:  75 year old female with history of shortness of breath. EXAM: CHEST - 2 VIEW COMPARISON:  Chest x-ray 02/10/2021. FINDINGS: Lung volumes are normal. No acute consolidative airspace disease. No pleural effusions. Diffuse peribronchial cuffing and widespread interstitial prominence, similar to prior studies. No pneumothorax. No definite suspicious appearing pulmonary nodules or masses are noted. Linear scarring in the left upper lobe. No evidence of pulmonary edema. Heart size is mildly enlarged with evidence of left atrial dilatation. Upper mediastinal contours are within normal limits. Atherosclerotic calcifications in the thoracic aorta. Status post median sternotomy for CABG and left atrial appendage ligation (a ligation clip is noted). Left-sided pacemaker device in place with lead tip projecting over the expected location of the right ventricle. IMPRESSION: 1. Diffuse peribronchial cuffing and widespread interstitial prominence, similar to prior examinations, likely reflective of chronic bronchitis. 2. Cardiomegaly with left atrial dilatation. 3. Aortic atherosclerosis. 4. Postoperative changes and support apparatus, as above. Electronically Signed   By: Vinnie Langton M.D.   On: 06/08/2021 07:29    EKG: Independently reviewed.  Ventricular paced rhythm at 69 bpm  Assessment/Plan Respiratory distress: Patient presents with complaints of shortness of breath.  Not noted to be hypoxic on room air, but found to be wheezing with lower extremity edema and JVD on physical exam.  Suspect likely multifactorial in setting of CHF and COPD  exacerbation. -Admit to a cardiac telemetry bed -PT to evaluate and treat   Combined systolic and diastolic CHF: Patient noted that she had recently ran out of torsemide 3 days ago.  On physical exam patient was noted to have lower extremity edema with JVD.  She reports that torsemide should have been delivered to her house today. -Strict I&Os -Daily weights -Follow-up BNP  COPD exacerbation: Acute.  Patient noted to be acutely wheezing.  O2 saturations otherwise maintained on room air.  Chest x-ray concerning for chronic bronchitis.  She had been given Solu-Medrol 125 mg and DuoNeb breathing treatment.  She was noted to be afebrile, but this appears to be patient's second possible COPD exacerbation this year. -Follow-up influenza and COVID-19 screening -DuoNebs 4 times daily and albuterol as needed -Empiric antibiotics of Rocephin -Start prednisone 40 mg in a.m. unless patient noted to still have significant wheezing  Permanent atrial fibrillation on chronic anticoagulation: Patient appears rate controlled. CHA2DS2-VASc score =7. -Continue Xarelto  Hypertensive urgency: Acute.  Blood pressures : On admission blood pressure initially elevated up to 188/112.  Home blood pressure regimen includes metoprolol succinate 100 mg daily, spironolactone 12.5 mg daily, losartan 25 mg daily, and torsemide 40 mg daily. -Continue metoprolol, spironolactone, and losartan -Hydralazine IV as needed  CAD s/p CABG -Continue statin  Elevated troponin: High-sensitivity troponin elevated at 23 with repeat unchanged.  Suspect secondary to demand. -Continue to monitor  Diabetes mellitus type 2, controlled: Last hemoglobin A1c was 6.8 in 01/2021.  On admission Glucose was 112.  Home medications appear to include metformin. -Hypoglycemic protocols -Hold metformin -CBGs before every meal with sensitive SSI -Adjust insulin regimen as needed as patient on steroid  Thrombocytopenia: Chronic.  Platelet count 110.   No reports of bleeding. -Continue to monitor  Hyperlipidemia -Continue atorvastatin  GERD -Continue Protonix  Tobacco abuse -Continue to counsel on need of cessation of tobacco use  Obesity: BMI 30.98 kg/m   DVT prophylaxis: Xarelto Code Status: Full Family  Communication: Noah Delaine updated over the phone at patient request Disposition Plan: Hopefully discharge home once medically stable Consults called: Cardiology Admission status: Inpatient  Norval Morton MD [facility]   If 7PM-7AM, please contact night-coverage   06/08/2021, 2:03 PM

## 2021-06-08 NOTE — ED Provider Notes (Signed)
[place] EMERGENCY DEPARTMENT Provider Note   CSN: [id number] Arrival date & time: 06/08/21  7353     History Chief Complaint  Patient presents with   Atrial Fibrillation   Chest Pain   Shortness of Breath    [name] [name] is a 75 y.o. female with PMH HTN, permanent pacemaker placement with permanent A. fib, CAD status post CABG, CHF, ischemic cardiomyopathy, HLD, COPD who presents the emergency department for evaluation of shortness of breath.  Patient states that she has had approximately 2 to 3 days of worsening dyspnea since running out of her torsemide and Protonix 2 to 3 days ago.  She denies chest pain, abdominal pain, nausea, vomiting, fever, headache or other systemic symptoms.  She arrives tachypneic in the 30s, but saturating 94% on room air.   Atrial Fibrillation Associated symptoms include shortness of breath. Pertinent negatives include no chest pain and no abdominal pain.  Chest Pain Associated symptoms: shortness of breath   Associated symptoms: no abdominal pain, no back pain, no cough, no fever, no palpitations and no vomiting   Shortness of Breath Associated symptoms: no abdominal pain, no chest pain, no cough, no ear pain, no fever, no rash, no sore throat and no vomiting       Past Medical History:  Diagnosis Date   (HFimpEF) heart failure with improved ejection fraction ([place])    a. 07/2018 Echo: EF 30-35%; b. 11/2019 Echo: EF 55-60%, no rwma, Gr2 DD, Nl RV size/fxn. Mild BAE. Mild MR/AI.   Arthritis    Asthma    Atopic dermatitis    CAD (coronary artery disease)    a. 2016 s/p CABG x 2 (LIMA->LAD, VG->OM); b. 08/2018 MV: EF 44%, no ischemia/infact.   Cardiac arrest ([place]) 10/2014   Cardiomyopathy, ischemic    a. 07/2018 Echo: EF 30-35%; 11/2019 Echo: EF 55-60%.   Carotid arterial disease ([place])    a. 11/2019 Carotid U/S   CKD (chronic kidney disease), stage III (HCC)    COPD (chronic obstructive pulmonary disease) ([place])    Esophageal  dilatation 2013   GERD (gastroesophageal reflux disease)    Gout    Headache    Hypertension    Hypokalemia    Idiopathic angioedema    LGI bleed 08/06/2017   a. felt to be hemorrhoidal during that admission (no drop in Hgb).   Lower back pain    Paroxysmal atrial fibrillation (HCC)    a. Dx 2016-->h/o difficult to control rates (complicated by noncompliance), not felt to be a candidate for ablation or antiarrhythmic due to noncompliance; b. Recurrent AF 2021 - converted w/ IV dilt; c. CHA2DS2VASc = 7-->Xarelto.   Personal history of noncompliance with medical treatment, presenting hazards to health    Prediabetes    S/P CABG x 2 with clipping of LA appendage 11/14/2014   LIMA to LAD, SVG to OM, EVH via right thigh   Urine incontinence    Uterine fibroid     Patient Active Problem List   Diagnosis Date Noted   Elevated troponin 02/10/2021   Acute respiratory failure with hypoxia ([place]) 12/08/2020   Hypotension 12/08/2020   Hypoalbuminemia 12/08/2020   Obstructive sleep apnea 09/21/2020   Atrial fibrillation with rapid ventricular response ([place]) 08/13/2020   Acute lower GI bleeding 02/10/2020   Tobacco dependence 02/10/2020   Obesity (BMI 30.0-34.9) 02/10/2020   Acute upper GI bleeding 10/26/2019   COPD (chronic obstructive pulmonary disease) ([place]) 10/26/2019   Acute GI bleeding 10/09/2019  Acute blood loss anemia 10/09/2019   Postmenopausal vaginal bleeding 01/04/2018   Snoring 01/04/2018   Acute on chronic combined systolic and diastolic CHF (congestive heart failure) ([place])    CHF exacerbation ([place]) 10/16/2017   Noncompliance 10/16/2017   Acute on chronic congestive heart failure (HCC)    Thrombocytopenia ([place]) 09/14/2017   Acute drug-induced gout of right foot    Medication noncompliance due to cognitive impairment 02/72/5366   Acute diastolic CHF (congestive heart failure) ([place]) 08/06/2017   LGI bleed, likely hemorrhoids 08/06/2017   Persistent atrial fibrillation  ([place]) 08/04/2017   Paroxysmal atrial fibrillation ([place]) 02/08/2017   Cardiomyopathy, ischemic 02/08/2017   Dysphagia 02/08/2017   CKD (chronic kidney disease), stage III ([place]) 01/30/2017   Pain in shoulder 02/08/2016   Breast pain, left 02/08/2016   Bilateral arm numbness and tingling while sleeping 02/08/2016   Painful lumpy left breast 09/23/2015   [place] intertrigo 02/11/2015   S/P CABG x 2 11/14/2014   Accelerated hypertension    Cardiac arrest ([place]) 11/04/2014   Left main coronary artery disease 11/04/2014   Coronary artery disease due to lipid rich plaque    Acute respiratory failure with hypoxemia [facility])    Essential hypertension    Atrial fibrillation with RVR (HCC)    Hypokalemia 10/30/2014   Chronic combined systolic and diastolic CHF (congestive heart failure) ([place]) 10/30/2014   NSTEMI (non-ST elevated myocardial infarction) ([place])    DOE (dyspnea on exertion)    CAP (community acquired pneumonia) 10/29/2014    Past Surgical History:  Procedure Laterality Date   AV NODE ABLATION N/A 01/18/2021   Procedure: AV NODE ABLATION;  Surgeon: Evans Lance, MD;  Location: [place] [facility];  Service: Cardiovascular;  Laterality: N/A;   BALLOON DILATION N/A 10/10/2019   Procedure: BALLOON DILATION;  Surgeon: Otis Brace, MD;  Location: [facility];  Service: Gastroenterology;  Laterality: N/A;   BIOPSY  10/10/2019   Procedure: BIOPSY;  Surgeon: Otis Brace, MD;  Location: [place];  Service: Gastroenterology;;   BIOPSY  10/11/2019   Procedure: BIOPSY;  Surgeon: Otis Brace, MD;  Location: [place];  Service: Gastroenterology;;   CARDIOVERSION N/A 11/18/2014   Procedure: CARDIOVERSION;  Surgeon: [name], MD;  Location: [place];  Service: Cardiovascular;  Laterality: N/A;   CARDIOVERSION N/A 12/25/2017   Procedure: CARDIOVERSION;  Surgeon: [name], MD;  Location: [place];  Service: Cardiovascular;  Laterality: N/A;   CLIPPING  OF ATRIAL APPENDAGE N/A 11/14/2014   Procedure: CLIPPING OF ATRIAL APPENDAGE;  Surgeon: [name], MD;  Location: [place];  Service: Open Heart Surgery;  Laterality: N/A;   COLONOSCOPY  2013   COLONOSCOPY WITH PROPOFOL N/A 10/11/2019   Procedure: COLONOSCOPY WITH PROPOFOL;  Surgeon: Otis Brace, MD;  Location: [place];  Service: Gastroenterology;  Laterality: N/A;   CORONARY ARTERY BYPASS GRAFT N/A 11/14/2014   Procedure: CORONARY ARTERY BYPASS GRAFTING (CABG)TIMES 2 USING LEFT INTERNAL MAMMARY ARTERY AND RIGHT SAPHENOUS VEIN HARVESTED ENDOSCOPICALLY;  Surgeon: [name], MD;  Location: [place];  Service: Open Heart Surgery;  Laterality: N/A;   ESOPHAGOGASTRODUODENOSCOPY (EGD) WITH PROPOFOL N/A 10/10/2019   Procedure: ESOPHAGOGASTRODUODENOSCOPY (EGD) WITH PROPOFOL;  Surgeon: Otis Brace, MD;  Location: [place];  Service: Gastroenterology;  Laterality: N/A;   ESOPHAGOGASTRODUODENOSCOPY (EGD) WITH PROPOFOL N/A 10/27/2019   Procedure: ESOPHAGOGASTRODUODENOSCOPY (EGD) WITH PROPOFOL;  Surgeon: Ronnette Juniper, MD;  Location: [place];  Service: Gastroenterology;  Laterality: N/A;   GIVENS CAPSULE STUDY N/A 10/27/2019   Procedure: GIVENS  CAPSULE STUDY;  Surgeon: Ronnette Juniper, MD;  Location: [place];  Service: Gastroenterology;  Laterality: N/A;   LEFT HEART CATHETERIZATION WITH CORONARY ANGIOGRAM N/A 11/04/2014   Procedure: LEFT HEART CATHETERIZATION WITH CORONARY ANGIOGRAM;  Surgeon: Troy Sine, MD;  Location: [facility] CATH LAB;  Service: Cardiovascular;  Laterality: N/A;   PACEMAKER IMPLANT N/A 01/18/2021   Procedure: PACEMAKER IMPLANT;  Surgeon: Evans Lance, MD;  Location: [place] CV LAB;  Service: Cardiovascular;  Laterality: N/A;   PACEMAKER IMPLANT Left    POLYPECTOMY  10/11/2019   Procedure: POLYPECTOMY;  Surgeon: Otis Brace, MD;  Location: [place] ENDOSCOPY;  Service: Gastroenterology;;   TEE WITHOUT CARDIOVERSION N/A 11/14/2014   Procedure:  TRANSESOPHAGEAL ECHOCARDIOGRAM (TEE);  Surgeon: [name], MD;  Location: [place];  Service: Open Heart Surgery;  Laterality: N/A;   TEE WITHOUT CARDIOVERSION N/A 12/25/2017   Procedure: TRANSESOPHAGEAL ECHOCARDIOGRAM (TEE);  Surgeon: [name], MD;  Location: [facility]OSCOPY;  Service: Cardiovascular;  Laterality: N/A;   TEMPORARY PACEMAKER INSERTION  11/04/2014   Procedure: TEMPORARY PACEMAKER INSERTION;  Surgeon: Troy Sine, MD;  Location: [facility] CATH LAB;  Service: Cardiovascular;;   TOOTH EXTRACTION       OB History   No obstetric history on file.     Family History  Problem Relation Age of Onset   Cancer Mother        LYMPHOMA   Heart disease Father    Other Father        TB   CVA Sister    Prostate cancer Brother 29   Diabetes Brother     Social History   Tobacco Use   Smoking status: Every Day    Packs/day: 0.50    Years: 56.00    Pack years: 28.00    Types: Cigarettes   Smokeless tobacco: Never   Tobacco comments:    smoking 2 cigarettes/day as of 10/23/20  Vaping Use   Vaping Use: Never used  Substance Use Topics   Alcohol use: No    Alcohol/week: 0.0 standard drinks   Drug use: No    Home Medications Prior to Admission medications   Medication Sig Start Date End Date Taking? Authorizing Provider  acetaminophen (TYLENOL) 325 MG tablet Take 2 tablets (650 mg total) by mouth every 4 (four) hours as needed for headache or mild pain. 12/09/20   Mikhail, Velta Addison, DO  allopurinol (ZYLOPRIM) 300 MG tablet Take 1 tablet (300 mg total) by mouth daily. 01/04/18   Georgiana Shore, NP  atorvastatin (LIPITOR) 10 MG tablet Take 10 mg by mouth at bedtime. 01/22/21   [provider]  losartan (COZAAR) 25 MG tablet Take 1 tablet (25 mg total) by mouth daily. 10/19/20 03/11/22  Baldwin Jamaica, PA-C  meclizine (ANTIVERT) 12.5 MG tablet Take 1 tablet (12.5 mg total) by mouth every 6 (six) hours as needed for dizziness. 05/18/21   [place]ford, Maricela Bo, FNP   metFORMIN (GLUCOPHAGE) 500 MG tablet Take 500 mg by mouth daily with breakfast. Daily with a meal    [provider]  metoprolol succinate (TOPROL-XL) 100 MG 24 hr tablet Take 1 tablet (100 mg total) by mouth daily. Take with or immediately following a meal. 04/30/21 07/29/21  Evans Lance, MD  nystatin (MYCOSTATIN/NYSTOP) powder Apply 1 application topically 3 (three) times daily. 04/23/21   [place]ford, Maricela Bo, FNP  pantoprazole (PROTONIX) 40 MG tablet Take 40 mg by mouth daily.    [provider]  rivaroxaban (XARELTO) 20 MG  TABS tablet Take 1 tablet (20 mg total) by mouth daily with supper. Resume from 11/01/2019 11/01/19   Aline August, MD  spironolactone (ALDACTONE) 25 MG tablet Take 0.5 tablets (12.5 mg total) by mouth daily. 06/04/21   [place], Maricela Bo, FNP  Tiotropium Bromide Monohydrate (SPIRIVA RESPIMAT) 2.5 MCG/ACT AERS Inhale 2 puffs into the lungs daily. 10/23/20   Parrett, Fonnie Mu, NP  torsemide (DEMADEX) 20 MG tablet Take 40 mg by mouth daily. 04/19/21   [provider]    Allergies    Bee venom, Ivp dye [iodinated diagnostic agents], Ace inhibitors, Atenolol, Codeine, Entresto [sacubitril-valsartan], Penicillin g, Shrimp [shellfish allergy], Simvastatin, and Jardiance [empagliflozin]  Review of Systems   Review of Systems  Constitutional:  Negative for chills and fever.  HENT:  Negative for ear pain and sore throat.   Eyes:  Negative for pain and visual disturbance.  Respiratory:  Positive for shortness of breath. Negative for cough.   Cardiovascular:  Negative for chest pain and palpitations.  Gastrointestinal:  Negative for abdominal pain and vomiting.  Genitourinary:  Negative for dysuria and hematuria.  Musculoskeletal:  Negative for arthralgias and back pain.  Skin:  Negative for color change and rash.  Neurological:  Negative for seizures and syncope.  All other systems reviewed and are negative.  Physical Exam Updated Vital Signs BP (!)  179/89   Pulse 72   Temp (!) 97.5 F (36.4 C) (Oral)   Resp 17   SpO2 100%   Physical Exam Vitals and nursing note reviewed.  Constitutional:      General: She is not in acute distress.    Appearance: She is well-developed.  HENT:     Head: Normocephalic and atraumatic.  Eyes:     Conjunctiva/sclera: Conjunctivae normal.  Cardiovascular:     Rate and Rhythm: Normal rate and regular rhythm.     Heart sounds: No murmur heard. Pulmonary:     Effort: Pulmonary effort is normal. No respiratory distress.     Breath sounds: Wheezing present.  Abdominal:     Palpations: Abdomen is soft.     Tenderness: There is no abdominal tenderness.  Musculoskeletal:     Cervical back: Neck supple.  Skin:    General: Skin is warm and dry.  Neurological:     Mental Status: She is alert.    ED Results / Procedures / Treatments   Labs (all labs ordered are listed, but only abnormal results are displayed) Labs Reviewed  BASIC METABOLIC PANEL - Abnormal; Notable for the following components:      Result Value   Chloride 113 (*)    CO2 18 (*)    Glucose, Bld 112 (*)    Creatinine, Ser 1.15 (*)    GFR, Estimated 49 (*)    All other components within normal limits  CBC - Abnormal; Notable for the following components:   RBC 5.33 (*)    HCT 46.3 (*)    RDW 22.8 (*)    Platelets 110 (*)    All other components within normal limits  TROPONIN I (HIGH SENSITIVITY) - Abnormal; Notable for the following components:   Troponin I (High Sensitivity) 23 (*)    All other components within normal limits  TROPONIN I (HIGH SENSITIVITY) - Abnormal; Notable for the following components:   Troponin I (High Sensitivity) 23 (*)    All other components within normal limits    EKG None  Radiology DG Chest 2 View  Result Date: 06/08/2021 CLINICAL DATA:  75 year old female with history of shortness of breath. EXAM: CHEST - 2 VIEW COMPARISON:  Chest x-ray 02/10/2021. FINDINGS: Lung volumes are normal. No  acute consolidative airspace disease. No pleural effusions. Diffuse peribronchial cuffing and widespread interstitial prominence, similar to prior studies. No pneumothorax. No definite suspicious appearing pulmonary nodules or masses are noted. Linear scarring in the left upper lobe. No evidence of pulmonary edema. Heart size is mildly enlarged with evidence of left atrial dilatation. Upper mediastinal contours are within normal limits. Atherosclerotic calcifications in the thoracic aorta. Status post median sternotomy for CABG and left atrial appendage ligation (a ligation clip is noted). Left-sided pacemaker device in place with lead tip projecting over the expected location of the right ventricle. IMPRESSION: 1. Diffuse peribronchial cuffing and widespread interstitial prominence, similar to prior examinations, likely reflective of chronic bronchitis. 2. Cardiomegaly with left atrial dilatation. 3. Aortic atherosclerosis. 4. Postoperative changes and support apparatus, as above. Electronically Signed   By: Vinnie Langton M.D.   On: 06/08/2021 07:29    Procedures Procedures   Medications Ordered in ED Medications - No data to display  ED Course  I have reviewed the triage vital signs and the nursing notes.  Pertinent labs & imaging results that were available during my care of the patient were reviewed by me and considered in my medical decision making (see chart for details).    MDM Rules/Calculators/A&P                           Patient seen emergency department for evaluation of shortness of breath.  Physical exam reveals a tachypneic patient with wheezing bilaterally but is otherwise unremarkable.  Evaluation with an elevated creatinine to 1.15, troponin elevation to 23 but delta troponin stable at 23, initial blood gas with a pH of 7.44, PCO2 27 given methylprednisolone and a DuoNeb for her wheezing.  Chest x-ray with cardiomegaly and interstitial findings suggestive of chronic bronchitis.   ECG nonischemic.  Cardiology consulted and Reds clip performed at 34% with mild volume overload.  40 mg IV Lasix given.  Patient will require admission for further work-up of tachypnea and likely need for diuresis.  Patient admitted. Final Clinical Impression(s) / ED Diagnoses Final diagnoses:  None    Rx / DC Orders ED Discharge Orders     None        Shadee Montoya, MD 06/08/21 1450

## 2021-06-08 NOTE — ED Triage Notes (Signed)
Pt c/o SP & SHOB associated w recent bout of afib, states current episode x3 days. Advises hx of same, compliant w medication & pacemaker in place. Hx CABG, CKD, NSTEMI. Pt SHOB in triage but able to speak in full sentences.

## 2021-06-08 NOTE — Progress Notes (Addendum)
REDS Clip  READING= 34% Station Marker C Ruler Value 26.5   Antonietta Jewel, PharmD, BCCCP Clinical Pharmacist  Phone: [phone] 06/08/2021 1:46 PM  Please check AMION for all [place] phone numbers After 10:00 PM, call [place] [phone]

## 2021-06-08 NOTE — Consult Note (Addendum)
Advanced Heart Failure Team Consult Note   Primary Physician: [name], MD PCP-Cardiologist:  [name], MD HF MD: Dr Haroldine Laws   Reason for Consultation: A/C HFrEF      HPI:    NIAOMI CARTAYA is seen today for evaluation of heart failure at the request of Dr Matilde Sprang.   Ms Peeks is a 75 year old with history of HTN, former tobacco abuse, permanent AF, CAD s/p CABG x 2 (LIMA to LAD and SVG to OM) in 2016, Chronic combined CHF, Ischemic CMP, HLD, and Biotronik PPM.   She was initially noted to be in atrial fibrillation in 2016 with a reduced LVEF, leading to ischemic workup with a cardiac cath showing severe multivessel CAD with LM involvement in which she underwent CABG x2 utilizing LIMA to LAD, SVG to OM.   Admitted 1/19 for AF with RVR in setting of Influenza A. EP was consulted to discuss plans for Tikosyn vs. ablation vs. medication rate control however, due to her known medication noncompliance she was found to not be an ablation nor antiarrhythmic candidate   Admitted 3/19 with recurrent Afib RVR and ADHF. Started on diltiazem. Echo repeated which showed fall in EF from previous. Diuresed with IV lasix. Discharged home on diltiazem 360, Torpol 100, digoxin and Xarelto.   2019 back in Afib. She was started on amiodarone & Entresto and diltiazem stopped.  She underwent successful atrial flutter TEE/DCCV on 12/25/17.  Later went back in A fib. Toprol DC'd with ongoing bradycardia. She later placed on low dose carvedilol.   Intolerant Entresto due to chest pain.    Admitted 3/21 for iron-def anemia. Hgb 5.4.  She was started on IV Protonix.  GI was consulted.  Underwent EGD/colonoscopy which were unremarkable and capsule endoscopy which showed duodenal ulcer with bleeding and stigmata.  GI recommended oral Protonix and outpatient follow-up.   Admitted 01/18/21 for AV node ablation and PPM due to uncontrolled heart rates with A fib. Underwent Biotronix PPM. Toprol XL and  digoxin stopped. Carvedilol 25 mg bid started. She was discharged 01/19/21.    She called Dr Forde Dandy office 02/04/21 due to headache after starting carvedilol. Carvedilol was stopped and she was switched back to Toprol XL.    Admitted 11/11/05 with a/c systolic heart failure. Diuresed with IV lasix and transitioned to torsemide 40 mg daily. Echo completed and showed EF 25-30% with severe MR/TR.     Followed closely in the HF clinic and was last seen 06/04/21. Volume status was stable.Reds Clip 33%. Continue on torsemide 40 mg daily and spiro was restarted.  She had been referred to Paramedicine but she was unreachable.   Ran out of torsemide and protonix 2-3 days ago. Worsening shortness of breath. No bleeding issues. Smoking 1-2 cigarettes a day. Denies fever or chills.   Presented to [facility] with dyspnea and chest pain. No fever. CXR with possible bronchitis. Pertinent admission labs: HS Trop 23>23, WBC 7, Hgb 14, creatinine 1.2, K 3.7.    Cardiac Studies  Echo 3/16 EF 35-40%. Echo 10/17 EF improved to 50-55%. Echo 5/18 EF stable 50-55% Echo 3/19 LVEF 30-35%, Moderate LVH, Mild AI, Moderate regurgitation, Severe LAE, Moderately reduced RV, Severe RAE, PA peak pressure 50 mm Hg.   (Decreased from Echo 01/11/2017 with LVEF 50-55%) Echo 12/19 Ef 30-35% RV mildly dilated. Echo 4/21 EF 55-60%  Echo 11/21 EF 30-35% RV normal  Echo 4/22 EF 25-30% RV moderately reduced  Echo 6/22 EF 25-30% RV severely reduced  Stress Test 1/20- Low Risk EF 44%  Review of Systems: [y] = yes, [ ]  = no   General: Weight gain [ ] ; Weight loss [ ] ; Anorexia [ ] ; Fatigue [Y ]; Fever [ ] ; Chills [ ] ; Weakness [ Y]  Cardiac: Chest pain/pressure [ Y]; Resting SOB [ ] ; Exertional SOB [ Y]; Orthopnea [ ] ; Pedal Edema [ Y]; Palpitations [ ] ; Syncope [ ] ; Presyncope [ ] ; Paroxysmal nocturnal dyspnea[ ]   Pulmonary: Cough [ ] ; Wheezing[ ] ; Hemoptysis[ ] ; Sputum [ ] ; Snoring [ ]   GI: Vomiting[ ] ; Dysphagia[ ] ; Melena[ ] ;  Hematochezia [ ] ; Heartburn[ ] ; Abdominal pain [ ] ; Constipation [ ] ; Diarrhea [ ] ; BRBPR [ ]   GU: Hematuria[ ] ; Dysuria [ ] ; Nocturia[ ]   Vascular: Pain in legs with walking [ ] ; Pain in feet with lying flat [ ] ; Non-healing sores [ ] ; Stroke [ ] ; TIA [ ] ; Slurred speech [ ] ;  Neuro: Headaches[ ] ; Vertigo[ ] ; Seizures[ ] ; Paresthesias[ ] ;Blurred vision [ ] ; Diplopia [ ] ; Vision changes [ ]   Ortho/Skin: Arthritis [ ] ; Joint pain [ ] ; Muscle pain [ ] ; Joint swelling [ ] ; Back Pain [ ] ; Rash [ ]   Psych: Depression[ ] ; Anxiety[ ]   Heme: Bleeding problems [ ] ; Clotting disorders [ ] ; Anemia [ ]   Endocrine: Diabetes [ ] ; Thyroid dysfunction[ ]   Home Medications Prior to Admission medications   Medication Sig Start Date End Date Taking? Authorizing Provider  acetaminophen (TYLENOL) 325 MG tablet Take 2 tablets (650 mg total) by mouth every 4 (four) hours as needed for headache or mild pain. 12/09/20   Mikhail, Velta Addison, DO  allopurinol (ZYLOPRIM) 300 MG tablet Take 1 tablet (300 mg total) by mouth daily. 01/04/18   Georgiana Shore, NP  atorvastatin (LIPITOR) 10 MG tablet Take 10 mg by mouth at bedtime. 01/22/21   [provider]  losartan (COZAAR) 25 MG tablet Take 1 tablet (25 mg total) by mouth daily. 10/19/20 03/11/22  Baldwin Jamaica, PA-C  meclizine (ANTIVERT) 12.5 MG tablet Take 1 tablet (12.5 mg total) by mouth every 6 (six) hours as needed for dizziness. 05/18/21   Milford, Maricela Bo, FNP  metFORMIN (GLUCOPHAGE) 500 MG tablet Take 500 mg by mouth daily with breakfast. Daily with a meal    [provider]  metoprolol succinate (TOPROL-XL) 100 MG 24 hr tablet Take 1 tablet (100 mg total) by mouth daily. Take with or immediately following a meal. 04/30/21 07/29/21  Evans Lance, MD  nystatin (MYCOSTATIN/NYSTOP) powder Apply 1 application topically 3 (three) times daily. 04/23/21   Milford, Maricela Bo, FNP  pantoprazole (PROTONIX) 40 MG tablet Take 40 mg by mouth daily.    [provider]  rivaroxaban (XARELTO) 20 MG TABS tablet Take 1 tablet (20 mg total) by mouth daily with supper. Resume from 11/01/2019 11/01/19   Aline August, MD  spironolactone (ALDACTONE) 25 MG tablet Take 0.5 tablets (12.5 mg total) by mouth daily. 06/04/21   Milford, Maricela Bo, FNP  Tiotropium Bromide Monohydrate (SPIRIVA RESPIMAT) 2.5 MCG/ACT AERS Inhale 2 puffs into the lungs daily. 10/23/20   Parrett, Fonnie Mu, NP  torsemide (DEMADEX) 20 MG tablet Take 40 mg by mouth daily. 04/19/21   [provider]    Past Medical History: Past Medical History:  Diagnosis Date   ([place]) heart failure with improved ejection fraction ([place])    a. 07/2018 Echo: EF 30-35%; b. 11/2019 Echo: EF 55-60%, no rwma, Gr2 DD, Nl RV size/fxn. Mild BAE.  Mild MR/AI.   Arthritis    Asthma    Atopic dermatitis    CAD (coronary artery disease)    a. 2016 s/p CABG x 2 (LIMA->LAD, VG->OM); b. 08/2018 MV: EF 44%, no ischemia/infact.   Cardiac arrest ([place]) 10/2014   Cardiomyopathy, ischemic    a. 07/2018 Echo: EF 30-35%; 11/2019 Echo: EF 55-60%.   Carotid arterial disease ([place])    a. 11/2019 Carotid U/S   CKD (chronic kidney disease), stage III (HCC)    COPD (chronic obstructive pulmonary disease) ([place])    Esophageal dilatation 2013   GERD (gastroesophageal reflux disease)    Gout    Headache    Hypertension    Hypokalemia    Idiopathic angioedema    LGI bleed 08/06/2017   a. felt to be hemorrhoidal during that admission (no drop in Hgb).   Lower back pain    Paroxysmal atrial fibrillation (HCC)    a. Dx 2016-->h/o difficult to control rates (complicated by noncompliance), not felt to be a candidate for ablation or antiarrhythmic due to noncompliance; b. Recurrent AF 2021 - converted w/ IV dilt; c. CHA2DS2VASc = 7-->Xarelto.   Personal history of noncompliance with medical treatment, presenting hazards to health    Prediabetes    S/P CABG x 2 with clipping of LA appendage 11/14/2014   LIMA to LAD, SVG to  OM, EVH via right thigh   Urine incontinence    Uterine fibroid     Past Surgical History: Past Surgical History:  Procedure Laterality Date   AV NODE ABLATION N/A 01/18/2021   Procedure: AV NODE ABLATION;  Surgeon: Evans Lance, MD;  Location: [place] [facility];  Service: Cardiovascular;  Laterality: N/A;   BALLOON DILATION N/A 10/10/2019   Procedure: BALLOON DILATION;  Surgeon: Otis Brace, MD;  Location: [facility];  Service: Gastroenterology;  Laterality: N/A;   BIOPSY  10/10/2019   Procedure: BIOPSY;  Surgeon: Otis Brace, MD;  Location: [place];  Service: Gastroenterology;;   BIOPSY  10/11/2019   Procedure: BIOPSY;  Surgeon: Otis Brace, MD;  Location: [place];  Service: Gastroenterology;;   CARDIOVERSION N/A 11/18/2014   Procedure: CARDIOVERSION;  Surgeon: [name], MD;  Location: [place];  Service: Cardiovascular;  Laterality: N/A;   CARDIOVERSION N/A 12/25/2017   Procedure: CARDIOVERSION;  Surgeon: [name], MD;  Location: [place];  Service: Cardiovascular;  Laterality: N/A;   CLIPPING OF ATRIAL APPENDAGE N/A 11/14/2014   Procedure: CLIPPING OF ATRIAL APPENDAGE;  Surgeon: [name], MD;  Location: [place];  Service: Open Heart Surgery;  Laterality: N/A;   COLONOSCOPY  2013   COLONOSCOPY WITH PROPOFOL N/A 10/11/2019   Procedure: COLONOSCOPY WITH PROPOFOL;  Surgeon: Otis Brace, MD;  Location: [place];  Service: Gastroenterology;  Laterality: N/A;   CORONARY ARTERY BYPASS GRAFT N/A 11/14/2014   Procedure: CORONARY ARTERY BYPASS GRAFTING (CABG)TIMES 2 USING LEFT INTERNAL MAMMARY ARTERY AND RIGHT SAPHENOUS VEIN HARVESTED ENDOSCOPICALLY;  Surgeon: [name], MD;  Location: [place];  Service: Open Heart Surgery;  Laterality: N/A;   ESOPHAGOGASTRODUODENOSCOPY (EGD) WITH PROPOFOL N/A 10/10/2019   Procedure: ESOPHAGOGASTRODUODENOSCOPY (EGD) WITH PROPOFOL;  Surgeon: Otis Brace, MD;  Location: [place];   Service: Gastroenterology;  Laterality: N/A;   ESOPHAGOGASTRODUODENOSCOPY (EGD) WITH PROPOFOL N/A 10/27/2019   Procedure: ESOPHAGOGASTRODUODENOSCOPY (EGD) WITH PROPOFOL;  Surgeon: Ronnette Juniper, MD;  Location: [place];  Service: Gastroenterology;  Laterality: N/A;   GIVENS CAPSULE STUDY N/A 10/27/2019   Procedure: GIVENS CAPSULE STUDY;  Surgeon: Therisa Doyne,  Megan Salon, MD;  Location: [place];  Service: Gastroenterology;  Laterality: N/A;   LEFT HEART CATHETERIZATION WITH CORONARY ANGIOGRAM N/A 11/04/2014   Procedure: LEFT HEART CATHETERIZATION WITH CORONARY ANGIOGRAM;  Surgeon: Troy Sine, MD;  Location: [facility] CATH LAB;  Service: Cardiovascular;  Laterality: N/A;   PACEMAKER IMPLANT N/A 01/18/2021   Procedure: PACEMAKER IMPLANT;  Surgeon: Evans Lance, MD;  Location: [place] CV LAB;  Service: Cardiovascular;  Laterality: N/A;   PACEMAKER IMPLANT Left    POLYPECTOMY  10/11/2019   Procedure: POLYPECTOMY;  Surgeon: Otis Brace, MD;  Location: [place] [facility]OSCOPY;  Service: Gastroenterology;;   TEE WITHOUT CARDIOVERSION N/A 11/14/2014   Procedure: TRANSESOPHAGEAL ECHOCARDIOGRAM (TEE);  Surgeon: [name], MD;  Location: [place];  Service: Open Heart Surgery;  Laterality: N/A;   TEE WITHOUT CARDIOVERSION N/A 12/25/2017   Procedure: TRANSESOPHAGEAL ECHOCARDIOGRAM (TEE);  Surgeon: [name], MD;  Location: [facility] ENDOSCOPY;  Service: Cardiovascular;  Laterality: N/A;   TEMPORARY PACEMAKER INSERTION  11/04/2014   Procedure: TEMPORARY PACEMAKER INSERTION;  Surgeon: Troy Sine, MD;  Location: [facility] CATH LAB;  Service: Cardiovascular;;   TOOTH EXTRACTION      Family History: Family History  Problem Relation Age of Onset   Cancer Mother        LYMPHOMA   Heart disease Father    Other Father        TB   CVA Sister    Prostate cancer Brother 59   Diabetes Brother     Social History: Social History   Socioeconomic History   Marital status: Widowed    Spouse name: Not on file    Number of children: 1   Years of education: 65   Highest education level: Not on file  Occupational History   Occupation: RETIRED [employer]  Tobacco Use   Smoking status: Every Day    Packs/day: 0.50    Years: 56.00    Pack years: 28.00    Types: Cigarettes   Smokeless tobacco: Never   Tobacco comments:    smoking 2 cigarettes/day as of 10/23/20  Vaping Use   Vaping Use: Never used  Substance and Sexual Activity   Alcohol use: No    Alcohol/week: 0.0 standard drinks   Drug use: No   Sexual activity: Not on file  Other Topics Concern   Not on file  Social History Narrative   Patient reports it being difficult to pay for everything due to outstanding medical bills from hospital stay last year but states she is able to keep up with basic expenses.  Patient does have some concerns with her house's condition due to a water leak- had her roof repaired last month but has some leaking in the front which has affected her porch.  Patient owns her own car and is able to drive herself but has some concerns about the reliability of her car- has gotten taxi to the clinic for appointment in the past when her car broke down.   Social Determinants of Health   Financial Resource Strain: Not on file  Food Insecurity: Not on file  Transportation Needs: Not on file  Physical Activity: Not on file  Stress: Not on file  Social Connections: Not on file    Allergies:  Allergies  Allergen Reactions   Bee Venom Anaphylaxis   Ivp Dye [Iodinated Diagnostic Agents] Anaphylaxis   Ace Inhibitors     Other reaction(s): angioedema   Atenolol     Other reaction(s): severe headaches  Codeine Nausea And Vomiting   Entresto [Sacubitril-Valsartan] Other (See Comments)    Chest pain    Penicillin G     Other reaction(s): rash   Shrimp [Shellfish Allergy] Swelling   Simvastatin     Other reaction(s): not sure   Jardiance [Empagliflozin] Other (See Comments)    Caused Boils     Objective:     Vital Signs:   Temp:  [97.5 F (36.4 C)] 97.5 F (36.4 C) (10/25 3845) Pulse Rate:  [70-72] 71 (10/25 1229) Resp:  [17-22] 20 (10/25 1229) BP: (152-179)/(89-117) 171/113 (10/25 1229) SpO2:  [95 %-100 %] 95 % (10/25 1229)    Weight change: There were no vitals filed for this visit.  Intake/Output:  No intake or output data in the 24 hours ending 06/08/21 1303    Physical Exam   REDS Clip 34%.  General:  Dyspneic with movement.  HEENT: normal Neck: supple. JVP 9-10  Carotids 2+ bilat; no bruits. No lymphadenopathy or thyromegaly appreciated. Cor: PMI nondisplaced. Irregular rate & rhythm. No rubs, gallops or murmurs. Lungs: Rhonchi throughout Abdomen: soft, nontender, nondistended. No hepatosplenomegaly. No bruits or masses. Good bowel sounds. Extremities: no cyanosis, clubbing, rash, RLE LLE trace-1+ edema Neuro: alert & orientedx3, cranial nerves grossly intact. moves all 4 extremities w/o difficulty. Affect pleasant   Telemetry   A fib 60s   EKG    A fib 69 bpm   Labs   Basic Metabolic Panel: Recent Labs  Lab 06/04/21 1244 06/08/21 0704  NA 140 142  K 3.6 3.7  CL 112* 113*  CO2 22 18*  GLUCOSE 128* 112*  BUN 21 22  CREATININE 1.03* 1.15*  CALCIUM 9.3 9.4    Liver Function Tests: No results for input(s): AST, ALT, ALKPHOS, BILITOT, PROT, ALBUMIN in the last 168 hours. No results for input(s): LIPASE, AMYLASE in the last 168 hours. No results for input(s): AMMONIA in the last 168 hours.  CBC: Recent Labs  Lab 06/08/21 0704  WBC 7.0  HGB 14.2  HCT 46.3*  MCV 86.9  PLT 110*    Cardiac Enzymes: No results for input(s): CKTOTAL, CKMB, CKMBINDEX, TROPONINI in the last 168 hours.  BNP: BNP (last 3 results) Recent Labs    11/15/20 0951 12/08/20 0830 02/10/21 0510  BNP 658.7* 1,065.4* 1,207.4*    ProBNP (last 3 results) No results for input(s): PROBNP in the last 8760 hours.   CBG: No results for input(s): GLUCAP in the last 168  hours.  Coagulation Studies: No results for input(s): LABPROT, INR in the last 72 hours.   Imaging   DG Chest 2 View  Result Date: 06/08/2021 CLINICAL DATA:  75 year old female with history of shortness of breath. EXAM: CHEST - 2 VIEW COMPARISON:  Chest x-ray 02/10/2021. FINDINGS: Lung volumes are normal. No acute consolidative airspace disease. No pleural effusions. Diffuse peribronchial cuffing and widespread interstitial prominence, similar to prior studies. No pneumothorax. No definite suspicious appearing pulmonary nodules or masses are noted. Linear scarring in the left upper lobe. No evidence of pulmonary edema. Heart size is mildly enlarged with evidence of left atrial dilatation. Upper mediastinal contours are within normal limits. Atherosclerotic calcifications in the thoracic aorta. Status post median sternotomy for CABG and left atrial appendage ligation (a ligation clip is noted). Left-sided pacemaker device in place with lead tip projecting over the expected location of the right ventricle. IMPRESSION: 1. Diffuse peribronchial cuffing and widespread interstitial prominence, similar to prior examinations, likely reflective of chronic bronchitis. 2. Cardiomegaly with  left atrial dilatation. 3. Aortic atherosclerosis. 4. Postoperative changes and support apparatus, as above. Electronically Signed   By: Vinnie Langton M.D.   On: 06/08/2021 07:29     Medications:     Current Medications:  ipratropium-albuterol  3 mL Nebulization Once   methylPREDNISolone (SOLU-MEDROL) injection  125 mg Intravenous Once    Infusions:     Patient Profile  s Honea is a 75 year old with history of HTN, former tobacco abuse, permanent AF, CAD s/p CABG x 2 (LIMA to LAD and SVG to OM) in 2016, Chronic combined CHF, Ischemic CMP, HLD, and Biotronik PPM.   Presenting with chest pain and increased shortness of breath.Out of torsemide 2-3 days.    Assessment/Plan  Dyspnea, suspect  multifactorial -She does have mild volume overload but suspect this is primarily COPD exacerbation.  - WBC 7.  -Started on solumedrol  2/ A/C Combined Systolic/Diastolic HF -08/270 ECHO 53-66% RV severely reduced.  -Reds Clip 34%. Mild volume overload. She had been out of torsemide 2-3 days.  Give 40 mg IV lasix now.  - need to make sure she has torsemide prior to discharge.  - Restart metoprolol 100 mg daily.  - Give 25 mg losartan daily and start spiro 12.5 mg daily.   - BNP pending.  -BMET pending.  - No SGLT2i with yeast infection   3. Permanent A fib  -Rate controlled.  -Continue xarelto.   4. CKD Stage III a -Creatinine baseline~ 1.3  -Check BMET   5. Tobacco Abuse  -Discussed smoking cessation.   Labs pending.    Length of Stay: 0  [name], NP  06/08/2021, 1:03 PM  Advanced Heart Failure Team Pager [phone] (M-F; 7a - 5p)  Please contact [facility] for night-coverage after hours (4p -7a ) and weekends on [email or web address]  Patient seen with NP, agree with the above note.   History as above.  Patient has not had torsemide for 3-4 days.  She has been wheezing and short of breath, came to the ER.  Thought to have COPD exacerbation and started on prednisone and nebs.    REDS clip 34%.   General: NAD Neck: JVP 12 cm, no thyromegaly or thyroid nodule.  Lungs: Distant BS CV: Nondisplaced PMI.  Heart irregular S1/S2, no S3/S4, no murmur.  1+ edema 1/2 to knees bilaterally.  No carotid bruit.  Normal pedal pulses.  Abdomen: Soft, nontender, no hepatosplenomegaly, no distention.  Skin: Intact without lesions or rashes.  Neurologic: Alert and oriented x 3.  Psych: Normal affect. Extremities: No clubbing or cyanosis.  HEENT: Normal.   Though REDS clip is not significantly elevated, patient does appear volume overloaded to me on exam, acute on chronic systolic CHF.  She has been out of torsemide for several days.  - Will give Lasix 60 mg IV bid, follow response.  -  Continue home losartan, spironolactone, and Toprol XL.  If creatinine remains stable, titrate up losartan (BP elevated).  - She has not tolerated Entresto.  - Not on SGLT2 inhibitor with history of yeast infection.   Possible co-existing COPC exacerbation.  Continue prednisone, needs nebs.   Chronic AF, continue Xarelto.   Loralie Champagne 06/08/2021 4:45 PM

## 2021-06-09 ENCOUNTER — Other Ambulatory Visit (HOSPITAL_COMMUNITY): Payer: Self-pay

## 2021-06-09 ENCOUNTER — Encounter (HOSPITAL_COMMUNITY): Payer: Self-pay | Admitting: Internal Medicine

## 2021-06-09 DIAGNOSIS — I4821 Permanent atrial fibrillation: Secondary | ICD-10-CM

## 2021-06-09 DIAGNOSIS — I5023 Acute on chronic systolic (congestive) heart failure: Secondary | ICD-10-CM

## 2021-06-09 DIAGNOSIS — I340 Nonrheumatic mitral (valve) insufficiency: Secondary | ICD-10-CM

## 2021-06-09 DIAGNOSIS — I5043 Acute on chronic combined systolic (congestive) and diastolic (congestive) heart failure: Secondary | ICD-10-CM

## 2021-06-09 DIAGNOSIS — I071 Rheumatic tricuspid insufficiency: Secondary | ICD-10-CM

## 2021-06-09 LAB — BASIC METABOLIC PANEL
Anion gap: 11 (ref 5–15)
BUN: 23 mg/dL (ref 8–23)
CO2: 19 mmol/L — ABNORMAL LOW (ref 22–32)
Calcium: 9.6 mg/dL (ref 8.9–10.3)
Chloride: 108 mmol/L (ref 98–111)
Creatinine, Ser: 1.19 mg/dL — ABNORMAL HIGH (ref 0.44–1.00)
GFR, Estimated: 47 mL/min — ABNORMAL LOW (ref >60)
Glucose, Bld: 177 mg/dL — ABNORMAL HIGH (ref 70–99)
Potassium: 3.9 mmol/L (ref 3.5–5.1)
Sodium: 138 mmol/L (ref 135–145)

## 2021-06-09 LAB — CBC
HCT: 47.6 % — ABNORMAL HIGH (ref 36.0–46.0)
Hemoglobin: 15.4 g/dL — ABNORMAL HIGH (ref 12.0–15.0)
MCH: 27.1 pg (ref 26.0–34.0)
MCHC: 32.4 g/dL (ref 30.0–36.0)
MCV: 83.8 fL (ref 80.0–100.0)
Platelets: 112 10*3/uL — ABNORMAL LOW (ref 150–400)
RBC: 5.68 MIL/uL — ABNORMAL HIGH (ref 3.87–5.11)
RDW: 22.9 % — ABNORMAL HIGH (ref 11.5–15.5)
WBC: 8.1 10*3/uL (ref 4.0–10.5)
nRBC: 0 % (ref 0.0–0.2)

## 2021-06-09 LAB — HEMOGLOBIN A1C
Hgb A1c MFr Bld: 6.9 % — ABNORMAL HIGH (ref 4.8–5.6)
Mean Plasma Glucose: 151.33 mg/dL

## 2021-06-09 LAB — GLUCOSE, CAPILLARY
Glucose-Capillary: 163 mg/dL — ABNORMAL HIGH (ref 70–99)
Glucose-Capillary: 175 mg/dL — ABNORMAL HIGH (ref 70–99)
Glucose-Capillary: 183 mg/dL — ABNORMAL HIGH (ref 70–99)
Glucose-Capillary: 181 mg/dL — ABNORMAL HIGH (ref 70–99)

## 2021-06-09 MED ORDER — INSULIN ASPART 100 UNIT/ML IJ SOLN
0.0000 [IU] | Freq: Three times a day (TID) | INTRAMUSCULAR | Status: DC
  Administered 2021-06-10: 1 [IU] via SUBCUTANEOUS

## 2021-06-09 MED ORDER — LOSARTAN POTASSIUM 25 MG PO TABS
25.0000 mg | ORAL_TABLET | Freq: Once | ORAL | Status: AC
  Administered 2021-06-09: 25 mg via ORAL
  Filled 2021-06-09: qty 1

## 2021-06-09 MED ORDER — ALUM & MAG HYDROXIDE-SIMETH 200-200-20 MG/5ML PO SUSP
15.0000 mL | Freq: Four times a day (QID) | ORAL | Status: DC | PRN
  Administered 2021-06-09: 15 mL via ORAL
  Filled 2021-06-09: qty 30

## 2021-06-09 MED ORDER — IPRATROPIUM-ALBUTEROL 0.5-2.5 (3) MG/3ML IN SOLN
3.0000 mL | Freq: Two times a day (BID) | RESPIRATORY_TRACT | Status: DC
  Administered 2021-06-09 – 2021-06-10 (×2): 3 mL via RESPIRATORY_TRACT
  Filled 2021-06-09 (×2): qty 3

## 2021-06-09 MED ORDER — SALINE SPRAY 0.65 % NA SOLN
1.0000 | NASAL | Status: DC | PRN
  Administered 2021-06-10: 1 via NASAL
  Filled 2021-06-09: qty 44

## 2021-06-09 MED ORDER — FLUTICASONE PROPIONATE 50 MCG/ACT NA SUSP
1.0000 | Freq: Every day | NASAL | Status: DC
  Administered 2021-06-09 – 2021-06-10 (×2): 1 via NASAL
  Filled 2021-06-09: qty 16

## 2021-06-09 MED ORDER — POTASSIUM CHLORIDE CRYS ER 20 MEQ PO TBCR
40.0000 meq | EXTENDED_RELEASE_TABLET | Freq: Once | ORAL | Status: AC
  Administered 2021-06-09: 40 meq via ORAL
  Filled 2021-06-09: qty 2

## 2021-06-09 MED ORDER — IPRATROPIUM-ALBUTEROL 0.5-2.5 (3) MG/3ML IN SOLN: 3.0000 mL | Freq: Two times a day (BID) | RESPIRATORY_TRACT | Status: DC

## 2021-06-09 MED ORDER — FUROSEMIDE 10 MG/ML IJ SOLN
80.0000 mg | Freq: Two times a day (BID) | INTRAMUSCULAR | Status: DC
  Administered 2021-06-09 – 2021-06-10 (×2): 80 mg via INTRAVENOUS
  Filled 2021-06-09 (×2): qty 8

## 2021-06-09 MED ORDER — LOSARTAN POTASSIUM 50 MG PO TABS
50.0000 mg | ORAL_TABLET | Freq: Every day | ORAL | Status: DC
  Administered 2021-06-10: 50 mg via ORAL
  Filled 2021-06-09: qty 1

## 2021-06-09 NOTE — Progress Notes (Signed)
PROGRESS NOTE  [name] [name] UJW:[id number] DOB: 1946-08-04   PCP: [name], MD  Patient is from: Home  DOA: 06/08/2021 LOS: 1  Chief complaints:  Chief Complaint  Patient presents with   Atrial Fibrillation   Chest Pain   Shortness of Breath     Brief Narrative / Interim history: 75 year old F with PMH of combined CHF, CAD/CABG, permanent A. fib, PPM, HTN, HLD and tobacco use disorder presenting with shortness of breath, intermittent chest pain, wheeze and generalized weakness, and admitted for CHF and COPD exacerbation.  Reportedly out of her torsemide.  Started on IV Lasix, Solu-Medrol and breathing treatments.  Subjective: Seen and examined earlier this morning.  No major events overnight of this morning.  She likes to go home to take care of her cat. She says her car is locked in house when she came to the hospital.  She says she lost multiple family members and does not have anyone to open the door for the cat.  Objective: Vitals:   06/09/21 0735 06/09/21 0801 06/09/21 0944 06/09/21 1128  BP: (!) 165/88   (!) 156/83  Pulse: 72   72  Resp: 20   18  Temp: (!) 97.4 F (36.3 C)   97.6 F (36.4 C)  TempSrc: Oral   Oral  SpO2: 92% 99%  97%  Weight:   88.2 kg   Height:        Intake/Output Summary (Last 24 hours) at 06/09/2021 1346 Last data filed at 06/09/2021 1300 Gross per 24 hour  Intake 1120 ml  Output 1725 ml  Net -605 ml   Filed Weights   06/08/21 2026 06/09/21 0944  Weight: 83.9 kg 88.2 kg    Examination:  GENERAL: No apparent distress.  Nontoxic. HEENT: MMM.  Vision and hearing grossly intact.  NECK: Supple.  No apparent JVD.  RESP: 97% on RA.  No IWOB.  Fair aeration bilaterally. CVS:  RRR. Heart sounds normal.  ABD/GI/GU: BS+. Abd soft, NTND.  MSK/EXT:  Moves extremities. No apparent deformity.  Trace edema in BLE. SKIN: no apparent skin lesion or wound NEURO: Awake, alert and oriented appropriately.  No apparent focal neuro  deficit. PSYCH: Calm. Normal affect.   Procedures:  None  Microbiology summarized: [id number] and influenza PCR nonreactive.  Assessment & Plan: Acute on chronic combined CHF: TTE in 01/2021 with LVEF of 25 to 30%, severely reduced RVSF, severe MR, severe TR. CHF exacerbation likely due to noncompliance.  Reportedly out of her torsemide for 2 to 3 days.  Started on IV Lasix.  About 1.2 L UOP/24 hours.  Still hypertensive.  Creatinine stable. -Advance heart failure team managing -Increased IV Lasix to 80 mg twice daily -GDMT-losartan, Toprol-XL and Aldactone -Not on SGLT2 inhibitors due to history of yeast infection -Closely monitor fluid status, renal functions and electrolytes -Sodium and fluid restriction  COPD exacerbation: Improving.  CXR with chronic bronchitis. -Continue systemic steroid, ceftriaxone and nebulizers  Hypertensive urgency/uncontrolled hypertension-likely due to noncompliance.  Improved. -Cardiac meds as above.  Permanent A. fib: CHA2DS2-VASc score 7.  Rate controlled. -Continue home Xarelto for anticoagulation  Elevated troponin/CAD s/p CABG: Reports intermittent chest pain but atypical.  Troponin elevation likely demand ischemia.  -Continue cardiac meds simvastatin   Controlled DM-2 with hyperglycemia/hyperlipidemia: A1c 6.9%.  Not on SGLT2 inhibitors due to history of yeast infection Recent Labs  Lab 06/08/21 1829 06/08/21 2326 06/09/21 0613 06/09/21 1130  GLUCAP 210* 174* 163* 175*  -Continue current insulin regimen and a statin  Thrombocytopenia: Chronic.  Stable. Recent Labs  Lab 06/08/21 0704 06/09/21 0347  PLT 110* 112*   GERD -Continue Protonix   Tobacco abuse -Encourage tobacco cessation -Nicotine patch   Class I obesity Body mass index is 30.46 kg/m.  -Encourage lifestyle change to lose weight.       DVT prophylaxis:   rivaroxaban (XARELTO) tablet 20 mg  Code Status: Full code Family Communication: Patient and/or RN.  Available if any question.  Level of care: Telemetry Cardiac Status is: Inpatient  Remains inpatient appropriate because: Fluid overload requiring IV Lasix    Consultants:  Advanced heart failure team   Sch Meds:  Scheduled Meds:  allopurinol  300 mg Oral Daily   atorvastatin  10 mg Oral QHS   furosemide  80 mg Intravenous BID   influenza vaccine adjuvanted  0.5 mL Intramuscular Tomorrow-1000   insulin aspart  0-9 Units Subcutaneous TID WC   ipratropium-albuterol  3 mL Nebulization BID   losartan  25 mg Oral Once   [START ON 06/10/2021] losartan  50 mg Oral Daily   metoprolol succinate  100 mg Oral Daily   pantoprazole  40 mg Oral Daily   predniSONE  40 mg Oral Q breakfast   rivaroxaban  20 mg Oral Q supper   sodium chloride flush  3 mL Intravenous Q12H   spironolactone  25 mg Oral Daily   Continuous Infusions:  sodium chloride     cefTRIAXone (ROCEPHIN)  IV Stopped (06/08/21 2000)   PRN Meds:.sodium chloride, acetaminophen **OR** acetaminophen, albuterol, hydrALAZINE, ondansetron (ZOFRAN) IV, sodium chloride flush  Antimicrobials: Anti-infectives (From admission, onward)    Start     Dose/Rate Route Frequency Ordered Stop   06/08/21 1530  cefTRIAXone (ROCEPHIN) 1 g in sodium chloride 0.9 % 100 mL IVPB        1 g 200 mL/hr over 30 Minutes Intravenous Every 24 hours 06/08/21 1518          I have personally reviewed the following labs and images: CBC: Recent Labs  Lab 06/08/21 0704 06/08/21 1407 06/09/21 0347  WBC 7.0  --  8.1  HGB 14.2 16.0* 15.4*  HCT 46.3* 47.0* 47.6*  MCV 86.9  --  83.8  PLT 110*  --  112*   BMP &GFR Recent Labs  Lab 06/04/21 1244 06/08/21 0704 06/08/21 1407 06/09/21 0347  NA 140 142 144 138  K 3.6 3.7 4.1 3.9  CL 112* 113*  --  108  CO2 22 18*  --  19*  GLUCOSE 128* 112*  --  177*  BUN 21 22  --  23  CREATININE 1.03* 1.15*  --  1.19*  CALCIUM 9.3 9.4  --  9.6   Estimated Creatinine Clearance: 45.8 mL/min (A) (by C-G  formula based on SCr of 1.19 mg/dL (H)). Liver & Pancreas: No results for input(s): AST, ALT, ALKPHOS, BILITOT, PROT, ALBUMIN in the last 168 hours. No results for input(s): LIPASE, AMYLASE in the last 168 hours. No results for input(s): AMMONIA in the last 168 hours. Diabetic: Recent Labs    06/09/21 0347  HGBA1C 6.9*   Recent Labs  Lab 06/08/21 1829 06/08/21 2326 06/09/21 0613 06/09/21 1130  GLUCAP 210* 174* 163* 175*   Cardiac Enzymes: No results for input(s): CKTOTAL, CKMB, CKMBINDEX, TROPONINI in the last 168 hours. No results for input(s): PROBNP in the last 8760 hours. Coagulation Profile: No results for input(s): INR, PROTIME in the last 168 hours. Thyroid Function Tests: No results for input(s): TSH, T4TOTAL,  FREET4, T3FREE, THYROIDAB in the last 72 hours. Lipid Profile: No results for input(s): CHOL, HDL, LDLCALC, TRIG, CHOLHDL, LDLDIRECT in the last 72 hours. Anemia Panel: No results for input(s): VITAMINB12, FOLATE, FERRITIN, TIBC, IRON, RETICCTPCT in the last 72 hours. Urine analysis:    Component Value Date/Time   COLORURINE YELLOW 12/08/2020 [place] 12/08/2020 0938   LABSPEC 1.017 12/08/2020 0938   PHURINE 5.0 12/08/2020 0938   GLUCOSEU NEGATIVE 12/08/2020 0938   HGBUR NEGATIVE 12/08/2020 0938   BILIRUBINUR NEGATIVE 12/08/2020 0938   BILIRUBINUR ng 12/25/2013 1342   KETONESUR NEGATIVE 12/08/2020 0938   PROTEINUR 30 (A) 12/08/2020 0938   UROBILINOGEN 0.2 04/11/2017 1402   NITRITE NEGATIVE 12/08/2020 0938   LEUKOCYTESUR NEGATIVE 12/08/2020 0938   Sepsis Labs: Invalid input(s): PROCALCITONIN, [place]  Microbiology: Recent Results (from the past 240 hour(s))  Resp Panel by RT-PCR (Flu A&B, Covid) Nasopharyngeal Swab     Status: None   Collection Time: 06/08/21  1:58 PM   Specimen: Nasopharyngeal Swab; Nasopharyngeal(NP) swabs in vial transport medium  Result Value Ref Range Status   SARS Coronavirus 2 by RT PCR NEGATIVE NEGATIVE  Final    Comment: (NOTE) SARS-CoV-2 target nucleic acids are NOT DETECTED.  The SARS-CoV-2 RNA is generally detectable in upper respiratory specimens during the acute phase of infection. The lowest concentration of SARS-CoV-2 viral copies this assay can detect is 138 copies/mL. A negative result does not preclude SARS-Cov-2 infection and should not be used as the sole basis for treatment or other patient management decisions. A negative result may occur with  improper specimen collection/handling, submission of specimen other than nasopharyngeal swab, presence of viral mutation(s) within the areas targeted by this assay, and inadequate number of viral copies(<138 copies/mL). A negative result must be combined with clinical observations, patient history, and epidemiological information. The expected result is Negative.  Fact Sheet for Patients:  EntrepreneurPulse.com.au  Fact Sheet for Healthcare Providers:  IncredibleEmployment.be  This test is no t yet approved or cleared by the [place] FDA and  has been authorized for detection and/or diagnosis of SARS-CoV-2 by FDA under an Emergency Use Authorization (EUA). This EUA will remain  in effect (meaning this test can be used) for the duration of the COVID-19 declaration under Section 564(b)(1) of the Act, 21 U.S.C.section 360bbb-3(b)(1), unless the authorization is terminated  or revoked sooner.       Influenza A by PCR NEGATIVE NEGATIVE Final   Influenza B by PCR NEGATIVE NEGATIVE Final    Comment: (NOTE) The Xpert Xpress SARS-CoV-2/FLU/RSV plus assay is intended as an aid in the diagnosis of influenza from Nasopharyngeal swab specimens and should not be used as a sole basis for treatment. Nasal washings and aspirates are unacceptable for Xpert Xpress SARS-CoV-2/FLU/RSV testing.  Fact Sheet for Patients: EntrepreneurPulse.com.au  Fact Sheet for Healthcare  Providers: IncredibleEmployment.be  This test is not yet approved or cleared by the [place] FDA and has been authorized for detection and/or diagnosis of SARS-CoV-2 by FDA under an Emergency Use Authorization (EUA). This EUA will remain in effect (meaning this test can be used) for the duration of the COVID-19 declaration under Section 564(b)(1) of the Act, 21 U.S.C. section 360bbb-3(b)(1), unless the authorization is terminated or revoked.  Performed at [facility], [place] [address], [place], [place] [address]     Radiology Studies: No results found.    Coryn Mosso [facility] [place]  If 7PM-7AM, please contact night-coverage [email or web address] 06/09/2021, 1:46 PM

## 2021-06-09 NOTE — Evaluation (Signed)
Occupational Therapy Evaluation Patient Details Name: [name] [name] MRN: [id number] DOB: 04-May-1946 Today's Date: 06/09/2021   History of Present Illness 74 y.o. female with medical history significant of hypertension, hyperlipidemia, permanent A. fib, CAD s/p CABG, combined CHF last EF noted to be 25-30%, s/p PPM, and tobacco abuse presents with complaints of shortness of breath. Patient complains of having increased work of breathing and wheezing.   Clinical Impression   Patient admitted for the diagnosis above.  PTA she lives alone, and generally needs no assist with ADL/IADL, or mobility.  Primary deficit is SOB and decreased activity tolerance.  Patient's O2 saturations did drop with ADL and mobility, see eval note for details.  Patient presents at her baseline with ADL completion and in room mobility, needing no assistance.  Verbal cues for energy conservation, but patient states she is at her baseline.  All questions answered, and OT educated about daily weights.  No further needs in the acute setting.        Recommendations for follow up therapy are one component of a multi-disciplinary discharge planning process, led by the attending physician.  Recommendations may be updated based on patient status, additional functional criteria and insurance authorization.   Follow Up Recommendations  No OT follow up    Assistance Recommended at Discharge None  Functional Status Assessment  Patient has not had a recent decline in their functional status  Equipment Recommendations       Recommendations for Other Services       Precautions / Restrictions Precautions Precautions: Other (comment) Precaution Comments: O2 dropped from 97% RA at rest, 90% after ADL/grooming and in room mobility. Restrictions Weight Bearing Restrictions: No      Mobility Bed Mobility Overal bed mobility: Independent                  Transfers Overall transfer level: Independent                         Balance Overall balance assessment: No apparent balance deficits (not formally assessed)                                         ADL either performed or assessed with clinical judgement   ADL Overall ADL's : At baseline                                       General ADL Comments: Increased SOB noted     Vision Patient Visual Report: No change from baseline       Perception Perception Perception: Not tested   Praxis Praxis Praxis: Not tested    Pertinent Vitals/Pain Pain Assessment: No/denies pain Pain Intervention(s): Monitored during session     Hand Dominance Right   Extremity/Trunk Assessment Upper Extremity Assessment Upper Extremity Assessment: Overall WFL for tasks assessed   Lower Extremity Assessment Lower Extremity Assessment: Defer to PT evaluation   Cervical / Trunk Assessment Cervical / Trunk Assessment: Normal   Communication Communication Communication: No difficulties   Cognition Arousal/Alertness: Awake/alert Behavior During Therapy: WFL for tasks assessed/performed Overall Cognitive Status: Within Functional Limits for tasks assessed  General Comments   Watch O2 sats on RA    Exercises     Shoulder Instructions      Home Living Family/patient expects to be discharged to:: Private residence Living Arrangements: Alone Available Help at Discharge: Family;Friend(s);Available PRN/intermittently Type of Home: House Home Access: Stairs to enter [employer] of Steps: 3 Entrance Stairs-Rails: Can reach both;Right;Left Home Layout: One level     Bathroom Shower/Tub: Teacher, early years/pre: Standard     Home Equipment: Cane - single point          Prior Functioning/Environment Prior Level of Function : Independent/Modified Independent             Mobility Comments: Occsional use of SPC ADLs Comments:  completes her own ADL/IADL, medication management, bill payment and continues to drive.        OT Problem List: Decreased activity tolerance      OT Treatment/Interventions:      OT Goals(Current goals can be found in the care plan section) Acute Rehab OT Goals Patient Stated Goal: Return home OT Goal Formulation: With patient Time For Goal Achievement: 06/09/21 Potential to Achieve Goals: Good  OT Frequency:     Barriers to D/C:  None noted          Co-evaluation              AM-PAC OT "6 Clicks" Daily Activity     Outcome Measure Help from another person eating meals?: None Help from another person taking care of personal grooming?: None Help from another person toileting, which includes using toliet, bedpan, or urinal?: None Help from another person bathing (including washing, rinsing, drying)?: None Help from another person to put on and taking off regular upper body clothing?: None Help from another person to put on and taking off regular lower body clothing?: None 6 Click Score: 24   End of Session Nurse Communication: Mobility status  Activity Tolerance: Patient tolerated treatment well Patient left: in bed;with call bell/phone within reach  OT Visit Diagnosis: Other (comment) (SOB)                Time: 8118-8677 OT Time Calculation (min): 27 min Charges:  OT General Charges $OT Visit: 1 Visit OT Evaluation $OT Eval Moderate Complexity: 1 Mod OT Treatments $Self Care/Home Management : 8-22 mins  06/09/2021  RP, OTR/L  Acute Rehabilitation Services  Office:  [phone]   Metta Clines 06/09/2021, 8:38 AM

## 2021-06-09 NOTE — Progress Notes (Addendum)
Advanced Heart Failure Rounding Note  PCP-Cardiologist: [name], MD   Subjective:     No weight today. Is/Os not accurate.  Scr stable.   BP elevated.   Reports some orthopnea. No CP.  Wants to leave to feed her cat.   Objective:   Weight Range: 83.9 kg Body mass index is 28.98 kg/m.   Vital Signs:   Temp:  [97.3 F (36.3 C)-97.9 F (36.6 C)] 97.4 F (36.3 C) (10/26 0735) Pulse Rate:  [68-88] 72 (10/26 0735) Resp:  [17-40] 20 (10/26 0735) BP: (153-198)/(88-148) 165/88 (10/26 0735) SpO2:  [91 %-100 %] 99 % (10/26 0801) Weight:  [83.9 kg] 83.9 kg (10/25 2026)    Weight change: Filed Weights   06/08/21 2026  Weight: 83.9 kg    Intake/Output:   Intake/Output Summary (Last 24 hours) at 06/09/2021 0930 Last data filed at 06/09/2021 0736 Gross per 24 hour  Intake 700 ml  Output 1225 ml  Net -525 ml      Physical Exam    General:  Sitting up on side of bed. No distress. HEENT: Normal Neck: Supple. JVP 10-12 cm. Carotids 2+ bilat; no bruits. No lymphadenopathy or thyromegaly appreciated. Cor: PMI nondisplaced. Irregular rate & rhythm. No rubs, gallops or murmurs. Lungs: Diminished Abdomen: Soft, nontender, nondistended. No hepatosplenomegaly. No bruits or masses. Good bowel sounds. Extremities: No cyanosis, clubbing, rash, 1+ edema Neuro: Alert & orientedx3, cranial nerves grossly intact. moves all 4 extremities w/o difficulty. Affect pleasant   Telemetry   AF, Vpaced, 60s (personally reviewed)   Labs    CBC Recent Labs    06/08/21 0704 06/08/21 1407 06/09/21 0347  WBC 7.0  --  8.1  HGB 14.2 16.0* 15.4*  HCT 46.3* 47.0* 47.6*  MCV 86.9  --  83.8  PLT 110*  --  865*   Basic Metabolic Panel Recent Labs    06/08/21 0704 06/08/21 1407 06/09/21 0347  NA 142 144 138  K 3.7 4.1 3.9  CL 113*  --  108  CO2 18*  --  19*  GLUCOSE 112*  --  177*  BUN 22  --  23  CREATININE 1.15*  --  1.19*  CALCIUM 9.4  --  9.6   Liver Function  Tests No results for input(s): AST, ALT, ALKPHOS, BILITOT, PROT, ALBUMIN in the last 72 hours. No results for input(s): LIPASE, AMYLASE in the last 72 hours. Cardiac Enzymes No results for input(s): CKTOTAL, CKMB, CKMBINDEX, TROPONINI in the last 72 hours.  BNP: BNP (last 3 results) Recent Labs    12/08/20 0830 02/10/21 0510 06/08/21 1358  BNP 1,065.4* 1,207.4* 1,220.7*    ProBNP (last 3 results) No results for input(s): PROBNP in the last 8760 hours.   D-Dimer No results for input(s): DDIMER in the last 72 hours. Hemoglobin A1C Recent Labs    06/09/21 0347  HGBA1C 6.9*   Fasting Lipid Panel No results for input(s): CHOL, HDL, LDLCALC, TRIG, CHOLHDL, LDLDIRECT in the last 72 hours. Thyroid Function Tests No results for input(s): TSH, T4TOTAL, T3FREE, THYROIDAB in the last 72 hours.  Invalid input(s): FREET3  Other results:   Imaging    No results found.   Medications:     Scheduled Medications:  allopurinol  300 mg Oral Daily   atorvastatin  10 mg Oral QHS   furosemide  60 mg Intravenous BID   influenza vaccine adjuvanted  0.5 mL Intramuscular Tomorrow-1000   insulin aspart  0-9 Units Subcutaneous TID WC  ipratropium-albuterol  3 mL Nebulization BID   losartan  25 mg Oral Daily   metoprolol succinate  100 mg Oral Daily   pantoprazole  40 mg Oral Daily   potassium chloride  40 mEq Oral Once   predniSONE  40 mg Oral Q breakfast   rivaroxaban  20 mg Oral Q supper   sodium chloride flush  3 mL Intravenous Q12H   spironolactone  25 mg Oral Daily    Infusions:  sodium chloride     cefTRIAXone (ROCEPHIN)  IV Stopped (06/08/21 2000)    PRN Medications: sodium chloride, acetaminophen **OR** acetaminophen, albuterol, hydrALAZINE, ondansetron (ZOFRAN) IV, sodium chloride flush    Patient Profile  [name] [name] is a 75 year old with history of HTN, former tobacco abuse, permanent AF, CAD s/p CABG x 2 (LIMA to LAD and SVG to OM) in 2016, chronic combined  CHF/biventricular failure, ischemic CMP, severe MR/TR, HLD, and Biotronik PPM.    Presenting with chest pain and increased shortness of breath.Out of torsemide 2-3 days.   Assessment/Plan   Dyspnea, suspect multifactorial -She does have mild volume overload but suspect this is primarily COPD exacerbation.  - WBC 8.  -Started on prednisone, DuoNebs and empiric abx   2. A/C Combined Systolic/Diastolic HF -Suspect etiology is combined Ischemic/NICM with AF -EF back in 2021 had improved to 55-60% -01/2021 ECHO 25-30%, RV severely reduced, severe MR, severe TR -Reds Clip 34% on admit. Appeared volume overloaded. JVD elevated in setting of severe TR. She had been out of torsemide 2-3 days.  BNP 1,200 (baseline over last 6 months).  -Given 40 mg IV lasix and started on 60 IV BID. No weight so far. Is/Os not accurate.  Still appears volume up. Continue IV lasix today. -Need to make sure she has torsemide at discharge -Continue metoprolol 100 mg daily.  -Hypertensive. Increase losartan to 50 mg daily (hx intolerance to Entresto) - Spiro 12.5 mg daily - No SGLT2i with hx yeast infection   3. CAD - Hx CABG X 2 in 2016 (LIMA to LAD, SVG to OM) - No CP - Continue statin. No asa since anticoagulated  4. Mitral regurgitation -Severe on echo 06/22 -? If would benefit from MitraClip   5. Permanent A fib  - Hx Biotronik dual chamber PPM/AV nodal ablation 06/22 -Rate controlled.  -Continue xarelto.    6. CKD Stage III a -Creatinine baseline~ 1.3  -Cr stable at 1.19   7. Tobacco Abuse  -Discussed smoking cessation.     Wants to go home to take care of her cat.   SDOH: Concerned about compliance with medications at home. Has paramedicine but they have been unable to reach her.   She is worried about cost of medicines. Will review with pharmD. May be in donut hole.     Length of Stay: 1  [facility], [place], PA-C  06/09/2021, 9:30 AM  Advanced Heart Failure Team Pager [phone]  (M-F; 7a - 5p)  Please contact [facility] for night-coverage after hours (5p -7a ) and weekends on [email or web address]   Patient seen and examined with the above-signed Advanced Practice Provider and/or Housestaff. I personally reviewed laboratory data, imaging studies and relevant notes. I independently examined the patient and formulated the important aspects of the plan. I have edited the note to reflect any of my changes or salient points. I have personally discussed the plan with the patient and/or family.  Still SOB + orthopnea. On IV lasix. I/Os not recorded.   General:  Lying  in bed  No resp difficulty HEENT: normal Neck: supple. JVP 9 . Carotids 2+ bilat; no bruits. No lymphadenopathy or thryomegaly appreciated. Cor: PMI nondisplaced. Regular rate & rhythm. No rubs, gallops or murmurs. Lungs: clear Abdomen: obese soft, nontender, nondistended. No hepatosplenomegaly. No bruits or masses. Good bowel sounds. Extremities: no cyanosis, clubbing, rash, 1+ edema Neuro: alert & orientedx3, cranial nerves grossly intact. moves all 4 extremities w/o difficulty. Affect pleasant  Remains volume overloaded. Increase IV lasix to 80 IV bid. Increase losartan to help with HTN.   [name], MD  1:51 PM

## 2021-06-09 NOTE — Evaluation (Signed)
Physical Therapy Evaluation Patient Details Name: [name] [name] MRN: [id number] DOB: [birth year] Today's Date: 06/09/2021  History of Present Illness  75 y.o. female with medical history significant of hypertension, hyperlipidemia, permanent A. fib, CAD s/p CABG, combined CHF last EF noted to be 25-30%, s/p PPM, and tobacco abuse presents with complaints of shortness of breath. Patient complains of having increased work of breathing and wheezing.  Clinical Impression  Patient evaluated by Physical Therapy with no further acute PT needs identified. All education has been completed and the patient has no further questions. Pt is mod I to independent for all mobility. Pt has no follow-up Physical Therapy or equipment needs. PT is signing off. Thank you for this referral.       Recommendations for follow up therapy are one component of a multi-disciplinary discharge planning process, led by the attending physician.  Recommendations may be updated based on patient status, additional functional criteria and insurance authorization.  Follow Up Recommendations No PT follow up    Assistance Recommended at Discharge PRN  Functional Status Assessment Patient has not had a recent decline in their functional status  Equipment Recommendations  None recommended by PT (has necessary equipment)       Precautions / Restrictions Precautions Precautions: Other (comment) Precaution Comments: O2 dropped from 97% RA at rest, 94% after ambulating in hallway, and practicing steps Restrictions Weight Bearing Restrictions: No      Mobility  Bed Mobility Overal bed mobility: Independent                  Transfers Overall transfer level: Independent                      Ambulation/Gait Ambulation/Gait assistance: Modified independent (Device/Increase time) Gait Distance (Feet): 400 Feet Assistive device: None Gait Pattern/deviations: WFL(Within Functional Limits);Drifts right/left Gait  velocity: WFL Gait velocity interpretation: >2.62 ft/sec, indicative of community ambulatory General Gait Details: increased effort for ambulation with slight drift back and forth over course of whole hallway, overall stable gait  Stairs Stairs: Yes Stairs assistance: Modified independent (Device/Increase time) Stair Management: One rail Left;Forwards;Alternating pattern;Step to pattern Number of Stairs: 4 General stair comments: mod I for step over ascent and step to for descent of 4 steps with use of rail on L     Balance Overall balance assessment: No apparent balance deficits (not formally assessed)                                           Pertinent Vitals/Pain Pain Assessment: No/denies pain Pain Intervention(s): Monitored during session    Home Living Family/patient expects to be discharged to:: Private residence Living Arrangements: Alone Available Help at Discharge: Family;Friend(s);Available PRN/intermittently Type of Home: House Home Access: Stairs to enter Entrance Stairs-Rails: Can reach both;Right;Left Entrance Stairs-Number of Steps: 3   Home Layout: One level Home Equipment: Cane - single point      Prior Function Prior Level of Function : Independent/Modified Independent             Mobility Comments: Occsional use of SPC ADLs Comments: completes her own ADL/IADL, medication management, bill payment and continues to drive, has someone who goes into store to shop for her     Hand Dominance   Dominant Hand: Right    Extremity/Trunk Assessment   Upper Extremity Assessment Upper Extremity Assessment: Defer to OT  evaluation    Lower Extremity Assessment Lower Extremity Assessment: Overall WFL for tasks assessed    Cervical / Trunk Assessment Cervical / Trunk Assessment: Normal  Communication   Communication: No difficulties  Cognition Arousal/Alertness: Awake/alert Behavior During Therapy: WFL for tasks  assessed/performed Overall Cognitive Status: Within Functional Limits for tasks assessed                                          General Comments General comments (skin integrity, edema, etc.): SaO2 on RA 98%O2 at rest, with ambulation SaO2 96%O2, with stair training SaO2 94%O2, HR 112bpm        Assessment/Plan    PT Assessment Patient does not need any further PT services  PT Problem List Cardiopulmonary status limiting activity           PT Goals (Current goals can be found in the Care Plan section)  Acute Rehab PT Goals Patient Stated Goal: get back home as soon as possible     AM-PAC PT "6 Clicks" Mobility  Outcome Measure Help needed turning from your back to your side while in a flat bed without using bedrails?: None Help needed moving from lying on your back to sitting on the side of a flat bed without using bedrails?: None Help needed moving to and from a bed to a chair (including a wheelchair)?: None Help needed standing up from a chair using your arms (e.g., wheelchair or bedside chair)?: None Help needed to walk in hospital room?: None Help needed climbing 3-5 steps with a railing? : None 6 Click Score: 24    End of Session Equipment Utilized During Treatment: Gait belt Activity Tolerance: Patient tolerated treatment well Patient left: Other (comment);with call bell/phone within reach (sitting EoB) Nurse Communication: Mobility status;Other (comment) (ready for d/c)      Time: 3212-2482 PT Time Calculation (min) (ACUTE ONLY): 16 min   Charges:   PT Evaluation $PT Eval Moderate Complexity: 1 Mod          Samarie Pinder B. [name] Dk PT, DPT Acute Rehabilitation Services Pager [phone] Office [phone]   [place] 06/09/2021, 9:36 AM

## 2021-06-09 NOTE — Progress Notes (Signed)
Pt c/o nasal congestion despite scheduled Flonase. PRN saline nasal spray ordered per standing orders.

## 2021-06-10 ENCOUNTER — Encounter (HOSPITAL_COMMUNITY): Payer: Self-pay | Admitting: Internal Medicine

## 2021-06-10 ENCOUNTER — Other Ambulatory Visit (HOSPITAL_COMMUNITY): Payer: Self-pay

## 2021-06-10 DIAGNOSIS — E1165 Type 2 diabetes mellitus with hyperglycemia: Secondary | ICD-10-CM

## 2021-06-10 LAB — RENAL FUNCTION PANEL
Albumin: 3.1 g/dL — ABNORMAL LOW (ref 3.5–5.0)
Anion gap: 14 (ref 5–15)
BUN: 35 mg/dL — ABNORMAL HIGH (ref 8–23)
CO2: 19 mmol/L — ABNORMAL LOW (ref 22–32)
Calcium: 8.9 mg/dL (ref 8.9–10.3)
Chloride: 104 mmol/L (ref 98–111)
Creatinine, Ser: 1.63 mg/dL — ABNORMAL HIGH (ref 0.44–1.00)
GFR, Estimated: 32 mL/min — ABNORMAL LOW (ref >60)
Glucose, Bld: 170 mg/dL — ABNORMAL HIGH (ref 70–99)
Phosphorus: 4.3 mg/dL (ref 2.5–4.6)
Potassium: 3.7 mmol/L (ref 3.5–5.1)
Sodium: 137 mmol/L (ref 135–145)

## 2021-06-10 LAB — CBC
HCT: 45.5 % (ref 36.0–46.0)
Hemoglobin: 14.7 g/dL (ref 12.0–15.0)
MCH: 27.2 pg (ref 26.0–34.0)
MCHC: 32.3 g/dL (ref 30.0–36.0)
MCV: 84.1 fL (ref 80.0–100.0)
Platelets: 120 10*3/uL — ABNORMAL LOW (ref 150–400)
RBC: 5.41 MIL/uL — ABNORMAL HIGH (ref 3.87–5.11)
RDW: 23.1 % — ABNORMAL HIGH (ref 11.5–15.5)
WBC: 15.5 10*3/uL — ABNORMAL HIGH (ref 4.0–10.5)
nRBC: 0 % (ref 0.0–0.2)

## 2021-06-10 LAB — GLUCOSE, CAPILLARY
Glucose-Capillary: 125 mg/dL — ABNORMAL HIGH (ref 70–99)
Glucose-Capillary: 118 mg/dL — ABNORMAL HIGH (ref 70–99)

## 2021-06-10 LAB — MAGNESIUM: Magnesium: 1.8 mg/dL (ref 1.7–2.4)

## 2021-06-10 LAB — BRAIN NATRIURETIC PEPTIDE: B Natriuretic Peptide: 965.5 pg/mL — ABNORMAL HIGH (ref 0.0–100.0)

## 2021-06-10 MED ORDER — TORSEMIDE 20 MG PO TABS
40.0000 mg | ORAL_TABLET | Freq: Every day | ORAL | 6 refills | Status: DC
  Filled 2021-06-10: qty 60, 30d supply, fill #0

## 2021-06-10 MED ORDER — SPIRONOLACTONE 25 MG PO TABS
25.0000 mg | ORAL_TABLET | Freq: Every day | ORAL | 6 refills | Status: DC
  Filled 2021-06-10: qty 30, 30d supply, fill #0

## 2021-06-10 MED ORDER — PREDNISONE 20 MG PO TABS
40.0000 mg | ORAL_TABLET | Freq: Every day | ORAL | 0 refills | Status: AC
  Filled 2021-06-10: qty 4, 2d supply, fill #0

## 2021-06-10 MED ORDER — LOSARTAN POTASSIUM 50 MG PO TABS
50.0000 mg | ORAL_TABLET | Freq: Every day | ORAL | 6 refills | Status: DC
  Filled 2021-06-10: qty 30, 30d supply, fill #0

## 2021-06-10 MED ORDER — ALLOPURINOL 100 MG PO TABS
100.0000 mg | ORAL_TABLET | Freq: Every day | ORAL | 1 refills | Status: DC
  Filled 2021-06-10: qty 30, 30d supply, fill #0

## 2021-06-10 NOTE — Discharge Instructions (Addendum)
**  START TORSEMIDE/SPIRONOLACTONE ON SATURDAY 06/12/2021**

## 2021-06-10 NOTE — Progress Notes (Addendum)
Advanced Heart Failure Rounding Note  PCP-Cardiologist: [name], MD   Subjective:   Yesterday diuresed with IV lasix and losartan increased to 50 mg daily. Negative 1.2 liters.   Weight down another 2 pounds.   Creatinine trending up 1.2>1.6  Wants to go home. Feels better.   Objective:   Weight Range: 87.3 kg Body mass index is 30.15 kg/m.   Vital Signs:   Temp:  [97.5 F (36.4 C)-98.2 F (36.8 C)] 97.5 F (36.4 C) (10/27 0439) Pulse Rate:  [69-72] 70 (10/27 0439) Resp:  [15-19] 15 (10/27 0439) BP: (137-158)/(81-92) 143/92 (10/27 0439) SpO2:  [93 %-100 %] 98 % (10/27 0825) Weight:  [87.3 kg] 87.3 kg (10/27 0439) Last BM Date: 06/08/21  Weight change: Filed Weights   06/08/21 2026 06/09/21 0944 06/10/21 0439  Weight: 83.9 kg 88.2 kg 87.3 kg    Intake/Output:   Intake/Output Summary (Last 24 hours) at 06/10/2021 1003 Last data filed at 06/10/2021 0819 Gross per 24 hour  Intake 1500 ml  Output 2875 ml  Net -1375 ml      Physical Exam    General: Sitting on the side of the bed.  No resp difficulty HEENT: normal Neck: supple. JVP 7-8. Carotids 2+ bilat; no bruits. No lymphadenopathy or thryomegaly appreciated. Cor: PMI nondisplaced. Regular rate & rhythm. No rubs, gallops or murmurs. Lungs: clear. No wheeze.  Abdomen: soft, nontender, nondistended. No hepatosplenomegaly. No bruits or masses. Good bowel sounds. Extremities: no cyanosis, clubbing, rash, R and LLE trace edema.  Neuro: alert & orientedx3, cranial nerves grossly intact. moves all 4 extremities w/o difficulty. Affect pleasant  Telemetry   V Paced 60-70s    Labs    CBC Recent Labs    06/09/21 0347 06/10/21 0237  WBC 8.1 15.5*  HGB 15.4* 14.7  HCT 47.6* 45.5  MCV 83.8 84.1  PLT 112* 384*   Basic Metabolic Panel Recent Labs    06/09/21 0347 06/10/21 0237  NA 138 137  K 3.9 3.7  CL 108 104  CO2 19* 19*  GLUCOSE 177* 170*  BUN 23 35*  CREATININE 1.19* 1.63*  CALCIUM  9.6 8.9  MG  --  1.8  PHOS  --  4.3   Liver Function Tests Recent Labs    06/10/21 0237  ALBUMIN 3.1*   No results for input(s): LIPASE, AMYLASE in the last 72 hours. Cardiac Enzymes No results for input(s): CKTOTAL, CKMB, CKMBINDEX, TROPONINI in the last 72 hours.  BNP: BNP (last 3 results) Recent Labs    02/10/21 0510 06/08/21 1358 06/10/21 0237  BNP 1,207.4* 1,220.7* 965.5*    ProBNP (last 3 results) No results for input(s): PROBNP in the last 8760 hours.   D-Dimer No results for input(s): DDIMER in the last 72 hours. Hemoglobin A1C Recent Labs    06/09/21 0347  HGBA1C 6.9*   Fasting Lipid Panel No results for input(s): CHOL, HDL, LDLCALC, TRIG, CHOLHDL, LDLDIRECT in the last 72 hours. Thyroid Function Tests No results for input(s): TSH, T4TOTAL, T3FREE, THYROIDAB in the last 72 hours.  Invalid input(s): FREET3  Other results:   Imaging    No results found.   Medications:     Scheduled Medications:  allopurinol  300 mg Oral Daily   atorvastatin  10 mg Oral QHS   fluticasone  1 spray Each Nare Daily   furosemide  80 mg Intravenous BID   influenza vaccine adjuvanted  0.5 mL Intramuscular Tomorrow-1000   insulin aspart  0-9 Units Subcutaneous  TID WC   ipratropium-albuterol  3 mL Nebulization BID   losartan  50 mg Oral Daily   metoprolol succinate  100 mg Oral Daily   pantoprazole  40 mg Oral Daily   predniSONE  40 mg Oral Q breakfast   rivaroxaban  20 mg Oral Q supper   sodium chloride flush  3 mL Intravenous Q12H   spironolactone  25 mg Oral Daily    Infusions:  sodium chloride     cefTRIAXone (ROCEPHIN)  IV 1 g (06/09/21 1503)    PRN Medications: sodium chloride, acetaminophen **OR** acetaminophen, albuterol, alum & mag hydroxide-simeth, hydrALAZINE, ondansetron (ZOFRAN) IV, sodium chloride, sodium chloride flush    Patient Profile  Alinna is a 75 year old with history of HTN, former tobacco abuse, permanent AF, CAD s/p CABG x 2  (LIMA to LAD and SVG to OM) in 2016, chronic combined CHF/biventricular failure, ischemic CMP, severe MR/TR, HLD, and Biotronik PPM.    Presenting with chest pain and increased shortness of breath.Out of torsemide 2-3 days.   Assessment/Plan   Dyspnea, suspect multifactorial -Admitted with mild volume overload but suspect this is primarily COPD exacerbation.  - WBC 8>15.5 . Getting steroids -Started on prednisone, DuoNebs and empiric abx   2. A/C Combined Systolic/Diastolic HF -Suspect etiology is combined Ischemic/NICM with AF -EF back in 2021 had improved to 55-60% -01/2021 ECHO 25-30%, RV severely reduced, severe MR, severe TR -Reds Clip 34% on admit. Appeared volume overloaded. JVD elevated in setting of severe TR. She had been out of torsemide 2-3 days.  BNP 1,200 (baseline over last 6 months)--> 965 today  - Appears dry and with creatinine bump.  - Stop spiro and IV lasix with creatinine bump. .  - Continue losartan.  --Need to make sure she has torsemide at discharge -Continue metoprolol 100 mg daily.  - No SGLT2i with hx yeast infection   3. CAD - Hx CABG X 2 in 2016 (LIMA to LAD, SVG to OM) - No CP - Continue statin. No asa since anticoagulated  4. Mitral regurgitation -Severe on echo 06/22 -? If would benefit from MitraClip   5. Permanent A fib  - Hx Biotronik dual chamber PPM/AV nodal ablation 06/22 -Rate controlled.  -Continue xarelto.    6. CKD Stage III a -Creatinine baseline~ 1.3  -Cr 1.2-->1.6 - Stopping IV lasix and spiro   7. Tobacco Abuse  -Discussed smoking cessation.   Can discharge today.   D/C meds  -Metoprolol 100 mg daily -xarelto 20 mg daily ( we will give samples today )  - Restart torsemide 40 mg on 10/29  - Restart spironolactone 25 mg on 10/29  - Losartan 50 mg daily  - Atorvastatin 10 mg daily   We have follow up for next week.    SDOH: Concerned about compliance with medications at home. Has paramedicine but they have been  unable to reach her. Try to refer back.   She is worried about cost of medicines. Will review with pharmD. May be in donut hole. She will need 2 months of xarelto.    Length of Stay: 2  [name], NP  06/10/2021, 10:03 AM  Advanced Heart Failure Team Pager [phone] (M-F; 7a - 5p)  Please contact [facility] for night-coverage after hours (5p -7a ) and weekends on [email or web address]   She is dry. Breathing is better. Scr is up slightly. Vitals stable  General:  Lying in bed  No resp difficulty HEENT: normal Neck: supple. no JVD. Carotids  2+ bilat; no bruits. No lymphadenopathy or thryomegaly appreciated. Cor: PMI nondisplaced. Regular rate & rhythm. No rubs, gallops or murmurs. Lungs: clear Abdomen: obese soft, nontender, nondistended. No hepatosplenomegaly. No bruits or masses. Good bowel sounds. Extremities: no cyanosis, clubbing, rash, edema Neuro: alert & orientedx3, cranial nerves grossly intact. moves all 4 extremities w/o difficulty. Affect pleasant  Ok for d/c today on above meds. Will arrange f/u in [facility] Clinic.   [name], MD  1:07 PM

## 2021-06-10 NOTE — Progress Notes (Signed)
  Mobility Specialist Criteria Algorithm Info.  Mobility Team: HOB elevated: Activity: Ambulated in hall; Dangled on edge of bed Range of motion: Active; All extremities Level of assistance: Independent Assistive device:  Minutes sitting in chair:  Minutes stood:   Minutes ambulated: 3 minutes Distance ambulated (ft): 480 ft Mobility response: Tolerated well Bed Position: Semi-fowlers  Patient received dangling EOB eager to participate in mobility with anticipation to get discharged. Ambulated in hallway with steady gait. Tolerated ambulation well without complaint or incident and was left dangling EOB with all needs met.   HR pre: 70 bpm; SaO2 96% HR during: 88 bpm; SaO2 95% HR post: 75 bpm; SaO2 97%  06/10/2021 9:48 AM

## 2021-06-10 NOTE — Discharge Summary (Signed)
Physician Discharge Summary  [name] [name] JKK:[id number] DOB: 30-May-1946 DOA: 06/08/2021  PCP: [name], MD  Admit date: 06/08/2021 Discharge date: 06/10/2021 Admitted From: Home Disposition: Home Recommendations for Outpatient Follow-up:  Follow ups as below. Please obtain CBC/BMP/Mag at follow up Please follow up on the following pending results: None Home Health: Not indicated Equipment/Devices: Not indicated Discharge Condition: Stable CODE STATUS: Full code  Follow-up [place] Follow up on 06/17/2021.   Specialty: Cardiology Why: Advanced Heart Failure Clinic at [facility] at 9 am Entrance C, Garage Code 4444 Contact information: [address] [id number] [place]        [name], MD. Schedule an appointment as soon as possible for a visit in 1 week(s).   Specialty: Internal Medicine Why: As needed Contact information: [address] [place] [address] [phone]                Hospital Course: 75 year old F with PMH of combined CHF, CAD/CABG, permanent A. fib, PPM, HTN, HLD and tobacco use disorder presenting with shortness of breath, intermittent chest pain, wheeze and generalized weakness, and admitted for CHF and COPD exacerbation.  Reportedly out of her torsemide.  Started on IV Lasix, Solu-Medrol and breathing treatments.  Patient's respiratory status and fluid status improved with IV diuretics, systemic steroid, antibiotic and breathing treatments.  She was ambulated in room air and maintained appropriate saturation.  Evaluated by therapy and no needles identified from therapy standpoint.  Patient wanted to go home to look after her cat  that was locked in her house. She is cleared for discharge by advanced heart failure team for outpatient follow-up in about a week.  Patient to restart her diuretics, losartan  and Aldactone in 2 days due to AKI.  Medications filled at [facility] prior to discharge.  Advanced heart failure team to coordinate per medicine visit.   See individual problem list below for more on hospital course.  Discharge Diagnoses:  Acute on chronic combined CHF: TTE in 01/2021 with LVEF of 25 to 30%, severely reduced RVSF, severe MR, severe TR. CHF exacerbation likely due to noncompliance.  Reportedly out of her torsemide for 2 to 3 days.  Diuresed with IV Lasix.  I&O incomplete but net negative of 2.1 L. -Cleared for discharge by advanced heart failure team on: -Metoprolol XL 100 mg daily -Xarelto 20 mg daily ( we will give samples today )  - Restart torsemide 40 mg on 10/29  - Restart spironolactone 25 mg on 10/29  - Losartan 50 mg daily  - Atorvastatin 10 mg daily  -Rx sent to [facility]. -Not on SGLT2 inhibitors due to history of yeast infection -Outpatient follow-up with cardiology next week   COPD exacerbation: Improving.  CXR with chronic bronchitis. -Discharged on p.o. prednisone for 2 more days -Continue inhalers   Hypertensive urgency/uncontrolled hypertension-likely due to noncompliance.  Normotensive. -Cardiac meds as above.   Permanent A. fib: CHA2DS2-VASc score 7.  Rate controlled. -Toprol-XL and Xarelto   Elevated troponin/CAD s/p CABG: Reports intermittent chest pain but atypical.  Troponin elevation likely demand ischemia.  -Continue cardiac meds and atorvastatin   Controlled DM-2 with hyperglycemia/hyperlipidemia: A1c 6.9%.  Not on SGLT2 inhibitors due to history of yeast infection -Continue low-dose metformin and statin.     Thrombocytopenia: Chronic.  Improved. Recent Labs  Lab 06/08/21 0704 06/09/21 0347 06/10/21 0237  PLT 110* 112*  120*    GERD -Continue Protonix   Tobacco abuse -Encourage tobacco cessation   Class I obesity Body mass index is 30.15 kg/m.  -Encourage lifestyle change to lose weight.         Discharge Exam: Vitals:    06/10/21 0037 06/10/21 0439 06/10/21 0825 06/10/21 1108  BP: 139/81 (!) 143/92  (!) 145/86  Pulse: 69 70  77  Temp: (!) 97.5 F (36.4 C) (!) 97.5 F (36.4 C)  (!) 97.4 F (36.3 C)  Resp: 17 15  20   Height:      Weight:  87.3 kg    SpO2: 98% 100% 98% 97%  TempSrc: Oral Oral  Oral  BMI (Calculated):  30.14       GENERAL: No apparent distress.  Nontoxic. HEENT: MMM.  Vision and hearing grossly intact.  NECK: Supple.  No apparent JVD.  RESP: 97% on RA.  No IWOB.  Fair aeration bilaterally. CVS:  RRR. Heart sounds normal.  ABD/GI/GU: Bowel sounds present. Soft. Non tender.  MSK/EXT:  Moves extremities. No apparent deformity.  Trace edema. SKIN: no apparent skin lesion or wound NEURO: Awake and alert.  Oriented appropriately.  No apparent focal neuro deficit. PSYCH: Calm. Normal affect.   Discharge Instructions  Discharge Instructions     (HEART FAILURE PATIENTS) Call MD:  Anytime you have any of the following symptoms: 1) 3 pound weight gain in 24 hours or 5 pounds in 1 week 2) shortness of breath, with or without a dry hacking cough 3) swelling in the hands, feet or stomach 4) if you have to sleep on extra pillows at night in order to breathe.   Complete by: As directed    Call MD for:  difficulty breathing, headache or visual disturbances   Complete by: As directed    Call MD for:  extreme fatigue   Complete by: As directed    Call MD for:  persistant dizziness or light-headedness   Complete by: As directed    Diet - low sodium heart healthy   Complete by: As directed    Discharge instructions   Complete by: As directed    It has been a pleasure taking care of you!  You were hospitalized with shortness of breath due to heart failure exacerbation and COPD exacerbation.  Your symptoms improved with treatment.  We are discharging you on more medication to continue taking at home.  Please review your new medication list and the directions on your medications before you take them.   It is very important that you take your medications as prescribed.  In addition to taking your medications as prescribed, we also recommend you avoid alcohol or over-the-counter pain medication other than plain Tylenol, limit the amount of water/fluid you drink to less than 6 cups (1500 cc) a day,  limit your sodium (salt) intake to less than 2 g (2000 mg) a day and weigh yourself daily at the same time and keeping your weight log.    Please follow-up with your primary care doctor in 1 to 2 weeks or sooner if needed.  Follow-up with your cardiologist as recommended to you.   Take care,   Increase activity slowly   Complete by: As directed       Allergies as of 06/10/2021       Reactions   Bee Venom Anaphylaxis   Ivp Dye [iodinated Diagnostic Agents] Anaphylaxis   Ace Inhibitors    Other reaction(s): angioedema   Atenolol    Other  reaction(s): severe headaches   Codeine Nausea And Vomiting   Entresto [sacubitril-valsartan] Other (See Comments)   Chest pain    Penicillin G    Other reaction(s): rash   Shrimp [shellfish Allergy] Swelling   Simvastatin    Other reaction(s): not sure   Jardiance [empagliflozin] Other (See Comments)   Caused Boils        Medication List     STOP taking these medications    ranitidine 75 MG tablet Commonly known as: ZANTAC       TAKE these medications    acetaminophen 325 MG tablet Commonly known as: TYLENOL Take 2 tablets (650 mg total) by mouth every 4 (four) hours as needed for headache or mild pain.   allopurinol 100 MG tablet Commonly known as: ZYLOPRIM Take 1 tablet (100 mg total) by mouth daily. What changed:  medication strength how much to take   atorvastatin 10 MG tablet Commonly known as: LIPITOR Take 10 mg by mouth at bedtime.   losartan 50 MG tablet Commonly known as: COZAAR Take 1 tablet (50 mg total) by mouth daily. Start taking on: June 11, 2021 What changed:  medication strength how much to take    meclizine 12.5 MG tablet Commonly known as: ANTIVERT Take 1 tablet (12.5 mg total) by mouth every 6 (six) hours as needed for dizziness.   metFORMIN 500 MG tablet Commonly known as: GLUCOPHAGE Take 500 mg by mouth daily with breakfast. Daily with a meal   metoprolol succinate 100 MG 24 hr tablet Commonly known as: TOPROL-XL Take 1 tablet (100 mg total) by mouth daily. Take with or immediately following a meal. What changed:  when to take this additional instructions   nystatin powder Commonly known as: MYCOSTATIN/NYSTOP Apply 1 application topically 3 (three) times daily.   pantoprazole 40 MG tablet Commonly known as: PROTONIX Take 40 mg by mouth daily.   Prednisol Ace-Moxiflox-Bromfen 1-0.5-0.075 % Susp Place 1 drop into the right eye 4 (four) times daily.   predniSONE 20 MG tablet Commonly known as: DELTASONE Take 2 tablets (40 mg total) by mouth daily with breakfast for 2 days. Start taking on: June 11, 2021   rivaroxaban 20 MG Tabs tablet Commonly known as: XARELTO Take 1 tablet (20 mg total) by mouth daily with supper. Resume from 11/01/2019   Spiriva Respimat 2.5 MCG/ACT Aers Generic drug: Tiotropium Bromide Monohydrate Inhale 2 puffs into the lungs daily.   spironolactone 25 MG tablet Commonly known as: ALDACTONE Take 1 tablet (25 mg total) by mouth daily. Start taking on: June 12, 2021 What changed:  how much to take These instructions start on June 12, 2021. If you are unsure what to do until then, ask your doctor or other care provider.   torsemide 20 MG tablet Commonly known as: Demadex Take 2 tablets (40 mg total) by mouth daily. Start taking on: June 12, 2021   Vitamin D 50 MCG (2000 UT) Caps Take 1 capsule by mouth daily.        Consultations: Advanced heart failure team  Procedures/Studies:   DG Chest 2 View  Result Date: 06/08/2021 CLINICAL DATA:  75 year old female with history of shortness of breath. EXAM: CHEST - 2  VIEW COMPARISON:  Chest x-ray 02/10/2021. FINDINGS: Lung volumes are normal. No acute consolidative airspace disease. No pleural effusions. Diffuse peribronchial cuffing and widespread interstitial prominence, similar to prior studies. No pneumothorax. No definite suspicious appearing pulmonary nodules or masses are noted. Linear scarring in the left upper lobe. No  evidence of pulmonary edema. Heart size is mildly enlarged with evidence of left atrial dilatation. Upper mediastinal contours are within normal limits. Atherosclerotic calcifications in the thoracic aorta. Status post median sternotomy for CABG and left atrial appendage ligation (a ligation clip is noted). Left-sided pacemaker device in place with lead tip projecting over the expected location of the right ventricle. IMPRESSION: 1. Diffuse peribronchial cuffing and widespread interstitial prominence, similar to prior examinations, likely reflective of chronic bronchitis. 2. Cardiomegaly with left atrial dilatation. 3. Aortic atherosclerosis. 4. Postoperative changes and support apparatus, as above. Electronically Signed   By: Vinnie Langton M.D.   On: 06/08/2021 07:29       The results of significant diagnostics from this hospitalization (including imaging, microbiology, ancillary and laboratory) are listed below for reference.     Microbiology: Recent Results (from the past 240 hour(s))  Resp Panel by RT-PCR (Flu A&B, Covid) Nasopharyngeal Swab     Status: None   Collection Time: 06/08/21  1:58 PM   Specimen: Nasopharyngeal Swab; Nasopharyngeal(NP) swabs in vial transport medium  Result Value Ref Range Status   SARS Coronavirus 2 by RT PCR NEGATIVE NEGATIVE Final    Comment: (NOTE) SARS-CoV-2 target nucleic acids are NOT DETECTED.  The SARS-CoV-2 RNA is generally detectable in upper respiratory specimens during the acute phase of infection. The lowest concentration of SARS-CoV-2 viral copies this assay can detect is 138 copies/mL.  A negative result does not preclude SARS-Cov-2 infection and should not be used as the sole basis for treatment or other patient management decisions. A negative result may occur with  improper specimen collection/handling, submission of specimen other than nasopharyngeal swab, presence of viral mutation(s) within the areas targeted by this assay, and inadequate number of viral copies(<138 copies/mL). A negative result must be combined with clinical observations, patient history, and epidemiological information. The expected result is Negative.  Fact Sheet for Patients:  EntrepreneurPulse.com.au  Fact Sheet for Healthcare Providers:  IncredibleEmployment.be  This test is no t yet approved or cleared by the [place] FDA and  has been authorized for detection and/or diagnosis of SARS-CoV-2 by FDA under an Emergency Use Authorization (EUA). This EUA will remain  in effect (meaning this test can be used) for the duration of the COVID-19 declaration under Section 564(b)(1) of the Act, 21 U.S.C.section 360bbb-3(b)(1), unless the authorization is terminated  or revoked sooner.       Influenza A by PCR NEGATIVE NEGATIVE Final   Influenza B by PCR NEGATIVE NEGATIVE Final    Comment: (NOTE) The Xpert Xpress SARS-CoV-2/FLU/RSV plus assay is intended as an aid in the diagnosis of influenza from Nasopharyngeal swab specimens and should not be used as a sole basis for treatment. Nasal washings and aspirates are unacceptable for Xpert Xpress SARS-CoV-2/FLU/RSV testing.  Fact Sheet for Patients: EntrepreneurPulse.com.au  Fact Sheet for Healthcare Providers: IncredibleEmployment.be  This test is not yet approved or cleared by the [place] FDA and has been authorized for detection and/or diagnosis of SARS-CoV-2 by FDA under an Emergency Use Authorization (EUA). This EUA will remain in effect (meaning this test  can be used) for the duration of the COVID-19 declaration under Section 564(b)(1) of the Act, 21 U.S.C. section 360bbb-3(b)(1), unless the authorization is terminated or revoked.  Performed at [facility], [place] [address], [place], [place] [address]      Labs:  CBC: Recent Labs  Lab 06/08/21 0704 06/08/21 1407 06/09/21 0347 06/10/21 0237  WBC 7.0  --  8.1  15.5*  HGB 14.2 16.0* 15.4* 14.7  HCT 46.3* 47.0* 47.6* 45.5  MCV 86.9  --  83.8 84.1  PLT 110*  --  112* 120*   BMP &GFR Recent Labs  Lab 06/04/21 1244 06/08/21 0704 06/08/21 1407 06/09/21 0347 06/10/21 0237  NA 140 142 144 138 137  K 3.6 3.7 4.1 3.9 3.7  CL 112* 113*  --  108 104  CO2 22 18*  --  19* 19*  GLUCOSE 128* 112*  --  177* 170*  BUN 21 22  --  23 35*  CREATININE 1.03* 1.15*  --  1.19* 1.63*  CALCIUM 9.3 9.4  --  9.6 8.9  MG  --   --   --   --  1.8  PHOS  --   --   --   --  4.3   Estimated Creatinine Clearance: 33.3 mL/min (A) (by C-G formula based on SCr of 1.63 mg/dL (H)). Liver & Pancreas: Recent Labs  Lab 06/10/21 0237  ALBUMIN 3.1*   No results for input(s): LIPASE, AMYLASE in the last 168 hours. No results for input(s): AMMONIA in the last 168 hours. Diabetic: Recent Labs    06/09/21 0347  HGBA1C 6.9*   Recent Labs  Lab 06/09/21 1130 06/09/21 1552 06/09/21 2205 06/10/21 0600 06/10/21 1109  GLUCAP 175* 183* 181* 125* 118*   Cardiac Enzymes: No results for input(s): CKTOTAL, CKMB, CKMBINDEX, TROPONINI in the last 168 hours. No results for input(s): PROBNP in the last 8760 hours. Coagulation Profile: No results for input(s): INR, PROTIME in the last 168 hours. Thyroid Function Tests: No results for input(s): TSH, T4TOTAL, FREET4, T3FREE, THYROIDAB in the last 72 hours. Lipid Profile: No results for input(s): CHOL, HDL, LDLCALC, TRIG, CHOLHDL, LDLDIRECT in the last 72 hours. Anemia Panel: No results for input(s): VITAMINB12, FOLATE, FERRITIN, TIBC, IRON, RETICCTPCT in  the last 72 hours. Urine analysis:    Component Value Date/Time   COLORURINE YELLOW 12/08/2020 [place] 12/08/2020 0938   LABSPEC 1.017 12/08/2020 0938   PHURINE 5.0 12/08/2020 0938   GLUCOSEU NEGATIVE 12/08/2020 0938   HGBUR NEGATIVE 12/08/2020 0938   BILIRUBINUR NEGATIVE 12/08/2020 0938   BILIRUBINUR ng 12/25/2013 1342   KETONESUR NEGATIVE 12/08/2020 0938   PROTEINUR 30 (A) 12/08/2020 0938   UROBILINOGEN 0.2 04/11/2017 1402   NITRITE NEGATIVE 12/08/2020 0938   LEUKOCYTESUR NEGATIVE 12/08/2020 0938   Sepsis Labs: Invalid input(s): PROCALCITONIN, LACTICIDVEN   Time coordinating discharge: 50 minutes  SIGNED:  Mercy Riding, MD  [facility] 06/10/2021, 1:29 PM

## 2021-06-10 NOTE — TOC Progression Note (Addendum)
Transition of Care [facility]) - Progression Note    Patient Details  Name: [name] [name] MRN: [id number] Date of Birth: [birth year]  Transition of Care [facility] CM/SW Contact  Zenon Mayo, RN Phone Number: 06/10/2021, 2:23 PM  Clinical Narrative:    Patient is for dc today, she states the HF team is setting her up with Nira Conn with paramedicine who will be checking on her.  She has a scale at home , she has a chart she feels out, but she has not being weighing everyday because she has an implant in her right eye and she is not suppose to strain her eyes by reading something.  She states she does not have a bp cuff but when heather comes by she will check her bp.  She states she is trying to eat a low sodium diet. She states she is doing pretty good with that.  She states she will have transportation home today. Per Dr. Jacinto Reap note she will need samples of xarelto,  NCM spoke with Amy PA, she states she will let the pharmacy know to bring this up to patient.  [facility] will bring medications prior to dc. NCM made STaff RN aware of this information.        Expected Discharge Plan and Services           Expected Discharge Date: 06/10/21                                     Social Determinants of Health (SDOH) Interventions    Readmission Risk Interventions Readmission Risk Prevention Plan 02/12/2021 12/09/2020  Transportation Screening Complete Complete  PCP or Specialist Appt within 3-5 Days Complete Complete  HRI or [place] Complete Complete  Social Work Consult for [place] Planning/Counseling Complete Complete  Palliative Care Screening Not Applicable Complete  Medication Review Press photographer) Complete Complete  Some recent data might be hidden

## 2021-06-14 DIAGNOSIS — K219 Gastro-esophageal reflux disease without esophagitis: Secondary | ICD-10-CM | POA: Diagnosis not present

## 2021-06-14 DIAGNOSIS — N1831 Chronic kidney disease, stage 3a: Secondary | ICD-10-CM | POA: Diagnosis not present

## 2021-06-14 DIAGNOSIS — I509 Heart failure, unspecified: Secondary | ICD-10-CM | POA: Diagnosis not present

## 2021-06-14 DIAGNOSIS — I10 Essential (primary) hypertension: Secondary | ICD-10-CM | POA: Diagnosis not present

## 2021-06-14 DIAGNOSIS — J449 Chronic obstructive pulmonary disease, unspecified: Secondary | ICD-10-CM | POA: Diagnosis not present

## 2021-06-14 DIAGNOSIS — I5020 Unspecified systolic (congestive) heart failure: Secondary | ICD-10-CM | POA: Diagnosis not present

## 2021-06-14 DIAGNOSIS — E1122 Type 2 diabetes mellitus with diabetic chronic kidney disease: Secondary | ICD-10-CM | POA: Diagnosis not present

## 2021-06-14 DIAGNOSIS — E7800 Pure hypercholesterolemia, unspecified: Secondary | ICD-10-CM | POA: Diagnosis not present

## 2021-06-14 DIAGNOSIS — H269 Unspecified cataract: Secondary | ICD-10-CM | POA: Diagnosis not present

## 2021-06-14 DIAGNOSIS — I4891 Unspecified atrial fibrillation: Secondary | ICD-10-CM | POA: Diagnosis not present

## 2021-06-14 DIAGNOSIS — I2510 Atherosclerotic heart disease of native coronary artery without angina pectoris: Secondary | ICD-10-CM | POA: Diagnosis not present

## 2021-06-14 DIAGNOSIS — J441 Chronic obstructive pulmonary disease with (acute) exacerbation: Secondary | ICD-10-CM | POA: Diagnosis not present

## 2021-06-16 ENCOUNTER — Other Ambulatory Visit (HOSPITAL_COMMUNITY): Payer: HMO

## 2021-06-17 ENCOUNTER — Other Ambulatory Visit: Payer: Self-pay

## 2021-06-17 ENCOUNTER — Ambulatory Visit (HOSPITAL_COMMUNITY)
Admit: 2021-06-17 | Discharge: 2021-06-17 | Disposition: A | Discharge status: Home / Self Care | Payer: HMO | Source: Ambulatory Visit | Attending: Cardiology | Admitting: Cardiology

## 2021-06-17 ENCOUNTER — Encounter (HOSPITAL_COMMUNITY): Payer: Self-pay

## 2021-06-17 VITALS — BP 158/78 | HR 71 | Wt 190.8 lb

## 2021-06-17 DIAGNOSIS — I4819 Other persistent atrial fibrillation: Secondary | ICD-10-CM | POA: Diagnosis not present

## 2021-06-17 DIAGNOSIS — I10 Essential (primary) hypertension: Secondary | ICD-10-CM | POA: Diagnosis not present

## 2021-06-17 DIAGNOSIS — Z720 Tobacco use: Secondary | ICD-10-CM

## 2021-06-17 DIAGNOSIS — Z951 Presence of aortocoronary bypass graft: Secondary | ICD-10-CM | POA: Insufficient documentation

## 2021-06-17 DIAGNOSIS — I4821 Permanent atrial fibrillation: Secondary | ICD-10-CM | POA: Insufficient documentation

## 2021-06-17 DIAGNOSIS — F17210 Nicotine dependence, cigarettes, uncomplicated: Secondary | ICD-10-CM | POA: Diagnosis not present

## 2021-06-17 DIAGNOSIS — N1832 Chronic kidney disease, stage 3b: Secondary | ICD-10-CM | POA: Diagnosis not present

## 2021-06-17 DIAGNOSIS — I340 Nonrheumatic mitral (valve) insufficiency: Secondary | ICD-10-CM | POA: Diagnosis not present

## 2021-06-17 DIAGNOSIS — E1122 Type 2 diabetes mellitus with diabetic chronic kidney disease: Secondary | ICD-10-CM | POA: Insufficient documentation

## 2021-06-17 DIAGNOSIS — N1831 Chronic kidney disease, stage 3a: Secondary | ICD-10-CM | POA: Insufficient documentation

## 2021-06-17 DIAGNOSIS — Z7984 Long term (current) use of oral hypoglycemic drugs: Secondary | ICD-10-CM | POA: Insufficient documentation

## 2021-06-17 DIAGNOSIS — Z7901 Long term (current) use of anticoagulants: Secondary | ICD-10-CM | POA: Insufficient documentation

## 2021-06-17 DIAGNOSIS — Z950 Presence of cardiac pacemaker: Secondary | ICD-10-CM | POA: Diagnosis not present

## 2021-06-17 DIAGNOSIS — I130 Hypertensive heart and chronic kidney disease with heart failure and stage 1 through stage 4 chronic kidney disease, or unspecified chronic kidney disease: Secondary | ICD-10-CM | POA: Insufficient documentation

## 2021-06-17 DIAGNOSIS — I5042 Chronic combined systolic (congestive) and diastolic (congestive) heart failure: Secondary | ICD-10-CM

## 2021-06-17 DIAGNOSIS — I2510 Atherosclerotic heart disease of native coronary artery without angina pectoris: Secondary | ICD-10-CM

## 2021-06-17 LAB — BASIC METABOLIC PANEL
Anion gap: 8 (ref 5–15)
BUN: 25 mg/dL — ABNORMAL HIGH (ref 8–23)
CO2: 25 mmol/L (ref 22–32)
Calcium: 8.9 mg/dL (ref 8.9–10.3)
Chloride: 108 mmol/L (ref 98–111)
Creatinine, Ser: 1.24 mg/dL — ABNORMAL HIGH (ref 0.44–1.00)
GFR, Estimated: 45 mL/min — ABNORMAL LOW (ref >60)
Glucose, Bld: 124 mg/dL — ABNORMAL HIGH (ref 70–99)
Potassium: 3.4 mmol/L — ABNORMAL LOW (ref 3.5–5.1)
Sodium: 141 mmol/L (ref 135–145)

## 2021-06-17 LAB — CBC
HCT: 47.9 % — ABNORMAL HIGH (ref 36.0–46.0)
Hemoglobin: 14.9 g/dL (ref 12.0–15.0)
MCH: 27.1 pg (ref 26.0–34.0)
MCHC: 31.1 g/dL (ref 30.0–36.0)
MCV: 87.1 fL (ref 80.0–100.0)
Platelets: 143 10*3/uL — ABNORMAL LOW (ref 150–400)
RBC: 5.5 MIL/uL — ABNORMAL HIGH (ref 3.87–5.11)
RDW: 22.8 % — ABNORMAL HIGH (ref 11.5–15.5)
WBC: 9.1 10*3/uL (ref 4.0–10.5)
nRBC: 0 % (ref 0.0–0.2)

## 2021-06-17 NOTE — Patient Instructions (Signed)
No medication changes!  Labs today We will only contact you if something comes back abnormal or we need to make some changes. Otherwise no news is good news!  Your physician recommends that you schedule a follow-up appointment in: Please keep your next appointment with Dr Haroldine Laws  Please call office at [phone] option 2 if you have any questions or concerns.    At the [facility], you and your health needs are our priority. As part of our continuing mission to provide you with exceptional heart care, we have created designated Provider Care Teams. These Care Teams include your primary Cardiologist (physician) and Advanced Practice Providers (APPs- Physician Assistants and Nurse Practitioners) who all work together to provide you with the care you need, when you need it.   You may see any of the following providers on your designated Care Team at your next follow up: Dr [name] Dr Haynes Kerns, NP Lyda Jester, [place] [facility] [place], [place] Audry Riles, PharmD   Please be sure to bring in all your medications bottles to every appointment.

## 2021-06-17 NOTE — Progress Notes (Signed)
Advanced Heart Failure Clinic Note  PCP: Dr Lysle Rubens  Primary Cardiologist: [name]  EP: Dr Lovena Le HF MD: Dr Haroldine Laws   HPI: [name] [name] is a 75 y.o. female with h/o HTN, former tobacco abuse, permanent AF, CAD s/p CABG x 2 (LIMA to LAD and SVG to OM) in 2016, Chronic combined CHF, Ischemic CMP, HLD, and Biotronik PPM.   She was initially noted to be in atrial fibrillation in 2016 with a reduced LVEF, leading to ischemic workup with a cardiac cath showing severe multivessel CAD with LM involvement in which she underwent CABG x2 utilizing LIMA to LAD, SVG to OM.   Admitted 1/19 for AF with RVR in setting of Influenza A. EP was consulted to discuss plans for Tikosyn vs. ablation vs. medication rate control however, due to her known medication noncompliance she was found to not be an ablation nor antiarrhythmic candidate   Admitted 3/19 with recurrent Afib RVR and ADHF. Started on diltiazem. Echo repeated which showed fall in EF from previous. Diuresed with IV lasix. Discharged home on diltiazem 360, Torpol 100, digoxin and Xarelto.   2019 back in Afib. She was started on amiodarone & Entresto and diltiazem stopped.  She underwent successful atrial flutter TEE/DCCV on 12/25/17.  Later went back in A fib. Toprol DC'd with ongoing bradycardia. She later placed on low dose carvedilol.   Intolerant Entresto due to chest pain.    Admitted 3/21 for iron-def anemia. Hgb 5.4.  She was started on IV Protonix.  GI was consulted.  Underwent EGD/colonoscopy which were unremarkable and capsule endoscopy which showed duodenal ulcer with bleeding and stigmata.  GI recommended oral Protonix and outpatient follow-up.  Admitted 01/18/21 for AV node ablation and PPM due to uncontrolled heart rates with A fib. Underwent Biotronix PPM. Toprol XL and digoxin stopped. Carvedilol 25 mg bid started. She was discharged 01/19/21.    Admitted 4/56/25 with a/c systolic heart failure. Diuresed with  IV lasix and transitioned to torsemide 40 mg daily. Echo completed and showed EF 25-30% with severe MR/TR.    Admitted 10/22 for COPD exacerbation with a component of A/C CHF, ran out of torsemide several days prior, ReDs initially 34%. Started on IV Lasix, Solu-Medrol and breathing treatments. GDMT held with bump in SCr. Discharge weight 192 lbs.  Today she returns for post hospitalization HF follow up. Feels her breathing is much improved since discharge. She is not SOB walking on flat ground. Overall feeling fine. Main complaint is some right eye irritation after her cataract surgery last month. Denies CP, dizziness, edema, or PND/Orthopnea. Appetite ok, she does not watch her salt or fluid intake. No fever or chills. She is not weighing at home. Taking all medications. Continues to smoke 1/2 cigarette/day.  Cardiac Studies   Echo 3/16 EF 35-40%. Echo 10/17 EF improved to 50-55%. Echo 5/18 EF stable 50-55% Echo 3/19 LVEF 30-35%, Moderate LVH, Mild AI, Moderate regurgitation, Severe LAE, Moderately reduced RV, Severe RAE, PA peak pressure 50 mm Hg.   (Decreased from Echo 01/11/2017 with LVEF 50-55%) Echo 12/19 Ef 30-35% RV mildly dilated. Echo 4/21 EF 55-60%  Echo 11/21 EF 30-35% RV normal  Echo 4/22 EF 25-30% RV moderately reduced  Echo 6/22 EF 25-30% RV severely reduced  Stress Test 1/20- Low Risk EF 44%  ROS: All systems negative except as listed in HPI, PMH  and Problem List.  SH:  Social History   Socioeconomic History   Marital status: Widowed    Spouse name: Not on file   Number of children: 1   Years of education: 101   Highest education level: Not on file  Occupational History   Occupation: RETIRED [employer]  Tobacco Use   Smoking status: Every Day    Packs/day: 0.50    Years: 56.00    Pack years: 28.00    Types: Cigarettes   Smokeless tobacco: Never   Tobacco comments:    smoking 2 cigarettes/day as of 10/23/20  Vaping Use   Vaping Use: Never used   Substance and Sexual Activity   Alcohol use: No    Alcohol/week: 0.0 standard drinks   Drug use: No   Sexual activity: Not on file  Other Topics Concern   Not on file  Social History Narrative   Patient reports it being difficult to pay for everything due to outstanding medical bills from hospital stay last year but states she is able to keep up with basic expenses.  Patient does have some concerns with her house's condition due to a water leak- had her roof repaired last month but has some leaking in the front which has affected her porch.  Patient owns her own car and is able to drive herself but has some concerns about the reliability of her car- has gotten taxi to the clinic for appointment in the past when her car broke down.   Social Determinants of Health   Financial Resource Strain: Not on file  Food Insecurity: Not on file  Transportation Needs: Not on file  Physical Activity: Not on file  Stress: Not on file  Social Connections: Not on file  Intimate Partner Violence: Not on file   FH:  Family History  Problem Relation Age of Onset   Cancer Mother        LYMPHOMA   Heart disease Father    Other Father        TB   CVA Sister    Prostate cancer Brother 91   Diabetes Brother    Past Medical History:  Diagnosis Date   ([place]) heart failure with improved ejection fraction ([place])    a. 07/2018 Echo: EF 30-35%; b. 11/2019 Echo: EF 55-60%, no rwma, Gr2 DD, Nl RV size/fxn. Mild BAE. Mild MR/AI.   Arthritis    Asthma    Atopic dermatitis    CAD (coronary artery disease)    a. 2016 s/p CABG x 2 (LIMA->LAD, VG->OM); b. 08/2018 MV: EF 44%, no ischemia/infact.   Cardiac arrest ([place]) 10/2014   Cardiomyopathy, ischemic    a. 07/2018 Echo: EF 30-35%; 11/2019 Echo: EF 55-60%.   Carotid arterial disease ([place])    a. 11/2019 Carotid U/S   CHF (congestive heart failure) ([facility])    CKD (chronic kidney disease), stage III (H[facility])    COPD (chronic obstructive pulmonary disease) (H[facility])     Diabetes mellitus without complication ([place])    Dysrhythmia    Esophageal dilatation 2013   GERD (gastroesophageal reflux disease)    Gout    Headache    Hypertension    Hypokalemia    Idiopathic angioedema    LGI bleed 08/06/2017   a. felt to be hemorrhoidal during that admission (no drop in Hgb).   Lower back pain    Paroxysmal atrial fibrillation ([facility])    a. Dx 2016-->h/o difficult to control rates (complicated by noncompliance), not felt to  be a candidate for ablation or antiarrhythmic due to noncompliance; b. Recurrent AF 2021 - converted w/ IV dilt; c. CHA2DS2VASc = 7-->Xarelto.   Personal history of noncompliance with medical treatment, presenting hazards to health    Prediabetes    Presence of permanent cardiac pacemaker    S/P CABG x 2 with clipping of LA appendage 11/14/2014   LIMA to LAD, SVG to OM, EVH via right thigh   Urine incontinence    Uterine fibroid    Current Outpatient Medications  Medication Sig Dispense Refill   acetaminophen (TYLENOL) 325 MG tablet Take 2 tablets (650 mg total) by mouth every 4 (four) hours as needed for headache or mild pain.     metoprolol succinate (TOPROL-XL) 100 MG 24 hr tablet Take 1 tablet (100 mg total) by mouth daily. Take with or immediately following a meal. (Patient taking differently: Take 100 mg by mouth daily.) 90 tablet 3   allopurinol (ZYLOPRIM) 100 MG tablet Take 1 tablet (100 mg total) by mouth daily. 30 tablet 1   atorvastatin (LIPITOR) 10 MG tablet Take 10 mg by mouth at bedtime.     Cholecalciferol (VITAMIN D) 50 MCG (2000 UT) CAPS Take 1 capsule by mouth daily.     losartan (COZAAR) 50 MG tablet Take 1 tablet (50 mg total) by mouth daily. 30 tablet 6   meclizine (ANTIVERT) 12.5 MG tablet Take 1 tablet (12.5 mg total) by mouth every 6 (six) hours as needed for dizziness. 30 tablet 0   metFORMIN (GLUCOPHAGE) 500 MG tablet Take 500 mg by mouth daily with breakfast. Daily with a meal     nystatin (MYCOSTATIN/NYSTOP) powder  Apply 1 application topically 3 (three) times daily. 90 g 3   pantoprazole (PROTONIX) 40 MG tablet Take 40 mg by mouth daily.     Prednisol Ace-Moxiflox-Bromfen 1-0.5-0.075 % SUSP Place 1 drop into the right eye 4 (four) times daily.     rivaroxaban (XARELTO) 20 MG TABS tablet Take 1 tablet (20 mg total) by mouth daily with supper. Resume from 11/01/2019     spironolactone (ALDACTONE) 25 MG tablet Take 1 tablet (25 mg total) by mouth daily. 30 tablet 6   Tiotropium Bromide Monohydrate (SPIRIVA RESPIMAT) 2.5 MCG/ACT AERS Inhale 2 puffs into the lungs daily. 1 g 5   torsemide (DEMADEX) 20 MG tablet Take 2 tablets (40 mg total) by mouth daily. 60 tablet 6   No current facility-administered medications for this encounter.   BP (!) 158/78   Pulse 71   Wt 86.5 kg (190 lb 12.8 oz)   SpO2 98%   BMI 29.88 kg/m   Wt Readings from Last 3 Encounters:  06/17/21 86.5 kg (190 lb 12.8 oz)  06/10/21 87.3 kg (192 lb 8 oz)  06/04/21 89.7 kg (197 lb 12.8 oz)   PHYSICAL EXAM: General:  NAD. No resp difficulty HEENT: Normal Neck: Supple. No JVD. Carotids 2+ bilat; no bruits. No lymphadenopathy or thryomegaly appreciated. Cor: PMI nondisplaced. Irregular rate & rhythm. No rubs, gallops or murmurs. Lungs: Clear Abdomen: Soft, nontender, nondistended. No hepatosplenomegaly. No bruits or masses. Good bowel sounds. Extremities: No cyanosis, clubbing, rash, edema Neuro: Alert & oriented x 3, cranial nerves grossly intact. Moves all 4 extremities w/o difficulty. Affect pleasant.  ECG: V-pacing  ASSESSMENT & PLAN:   1. Chronic Combined Systolic/Diastolic HF - Suspect etiology is combined Ischemic/NICM with AF - EF back in 2021 had improved to 55-60% - Echo (6/22): 25-30%, RV severely reduced, severe MR, severe TR. -  Admitted with mild volume overload but suspected primarily COPD exacerbation, dyspnea resolved. - NYHA II, volume good today.  - Continue losartan 50 mg daily.  - Continue spironolactone 25  mg daily. - Continue torsemide 40 mg daily. - Continue metoprolol 100 mg daily (intolerant to Coreg due to HA).  - No SGLT2i with hx yeast infection. - Repeat Echo. - Labs today.    2. CAD - Hx CABG X 2 in 2016 (LIMA to LAD, SVG to OM) - No CP. - Continue statin.  - No asa since anticoagulated.   3. Mitral regurgitation - Severe on echo 06/22. -? If would benefit from MitraClip.   4. Permanent A fib  - Hx Biotronik dual chamber PPM/AV nodal ablation 06/22. - Rate controlled.  - Continue Xarelto. No abnormal bleeding. - CBC today.   5. CKD Stage III a - Creatinine baseline~ 1.3.  - BMET today.   6. HTN - Elevated today but has not had morning medications yet.  7. Tobacco Abuse  - Discussed smoking cessation.    She has been referred to [facility], however they have been unable to have reliable contact with patient and she has thus been discharged. May need to re-visit  Follow up with [name] + echo as scheduled.  Allena Katz, FNP-BC 06/17/21

## 2021-06-18 ENCOUNTER — Other Ambulatory Visit: Payer: Self-pay | Admitting: Cardiology

## 2021-06-18 NOTE — Telephone Encounter (Signed)
*  STAT* If patient is at the pharmacy, call can be transferred to refill team.   1. Which medications need to be refilled? (please list name of each medication and dose if known) losartan (COZAAR) 25 MG tablet spironolactone (ALDACTONE) 25 MG tablet  2. Which pharmacy/location (including street and city if local pharmacy) is medication to be sent to? [facility] - [place], [place] - [place] [address]  3. Do they need a 30 day or 90 day supply?  30 day supply

## 2021-06-18 NOTE — Telephone Encounter (Signed)
Left message for patient to call the office Monday to clarify her dose of Losartan.

## 2021-06-21 ENCOUNTER — Telehealth (HOSPITAL_COMMUNITY): Payer: Self-pay

## 2021-06-21 ENCOUNTER — Telehealth: Payer: Self-pay | Admitting: Internal Medicine

## 2021-06-21 MED ORDER — SPIRONOLACTONE 25 MG PO TABS: 25.0000 mg | ORAL_TABLET | Freq: Every day | ORAL | 11 refills | Status: DC

## 2021-06-21 MED ORDER — LOSARTAN POTASSIUM 25 MG PO TABS: 25.0000 mg | ORAL_TABLET | Freq: Every day | ORAL | 11 refills | Status: DC

## 2021-06-21 MED ORDER — LOSARTAN POTASSIUM 50 MG PO TABS: 50.0000 mg | ORAL_TABLET | Freq: Every day | ORAL | 3 refills | Status: DC

## 2021-06-21 NOTE — Telephone Encounter (Signed)
-----   Message from Rafael Bihari, Bruce sent at 06/17/2021  1:31 PM EDT ----- K is low, please start 10 mEq of KCL daily. Repeat BMET in 2 weeks

## 2021-06-21 NOTE — Telephone Encounter (Signed)
Losartan was increased to 50 mg See Hospital discharge ./cy

## 2021-06-21 NOTE — Telephone Encounter (Signed)
*  STAT* If patient is at the pharmacy, call can be transferred to refill team.   1. Which medications need to be refilled? (please list name of each medication and dose if known) Spironolactone and Losartan  2. Which pharmacy/location (including street and city if local pharmacy) is medication to be sent to? [facility] [address]  3. Do they need a 30 day or 90 day supply? 30 days and refills

## 2021-06-21 NOTE — Telephone Encounter (Signed)
Lmtrc, letter mailed to address on file- 3rd attempt

## 2021-06-21 NOTE — Telephone Encounter (Signed)
Pt c/o medication issue:  1. Name of Medication:  losartan (COZAAR) 25 MG tablet spironolactone (ALDACTONE) 25 MG tablet  2. How are you currently taking this medication (dosage and times per day)?   3. Are you having a reaction (difficulty breathing--STAT)?   4. What is your medication issue? Patient's PCP was calling to confirm the patient's dosages of these medications. Please call and ask for [place] [facility]

## 2021-06-21 NOTE — Telephone Encounter (Signed)
This is a CHF pt 

## 2021-06-23 DIAGNOSIS — Z23 Encounter for immunization: Secondary | ICD-10-CM | POA: Diagnosis not present

## 2021-06-25 NOTE — Telephone Encounter (Signed)
Pt left vm after receiving letter in the mail. I called pt back to discuss results. No answer/ lmtrc.

## 2021-07-05 ENCOUNTER — Telehealth (HOSPITAL_COMMUNITY): Payer: Self-pay | Admitting: *Deleted

## 2021-07-05 NOTE — Telephone Encounter (Signed)
Pt called to report last night she had pain in her rib under her breast on the left side. Pt denies any shortness of breath, edema, neck pain, jaw pain, arm pain, or diaphoresis. Pt said she is not currently having chest pain. Pt said pain was relieved with "muscle cream". Pt advised to call our office if pain returns per [facility]. Advised to dial 911 if chest pain is severe or pain is accompanied by any of the above symptoms. Pt aware and thanked me for the call,.

## 2021-07-06 ENCOUNTER — Encounter (HOSPITAL_COMMUNITY): Payer: Self-pay | Admitting: Emergency Medicine

## 2021-07-06 ENCOUNTER — Emergency Department (HOSPITAL_COMMUNITY)
Admission: EM | Admit: 2021-07-06 | Discharge: 2021-07-06 | Disposition: A | Discharge status: Home / Self Care | Payer: HMO | Attending: Emergency Medicine | Admitting: Emergency Medicine

## 2021-07-06 ENCOUNTER — Emergency Department (HOSPITAL_COMMUNITY): Payer: HMO

## 2021-07-06 ENCOUNTER — Other Ambulatory Visit: Payer: Self-pay

## 2021-07-06 DIAGNOSIS — R0789 Other chest pain: Secondary | ICD-10-CM | POA: Diagnosis not present

## 2021-07-06 DIAGNOSIS — F17210 Nicotine dependence, cigarettes, uncomplicated: Secondary | ICD-10-CM | POA: Diagnosis not present

## 2021-07-06 DIAGNOSIS — I130 Hypertensive heart and chronic kidney disease with heart failure and stage 1 through stage 4 chronic kidney disease, or unspecified chronic kidney disease: Secondary | ICD-10-CM | POA: Diagnosis not present

## 2021-07-06 DIAGNOSIS — Z7901 Long term (current) use of anticoagulants: Secondary | ICD-10-CM | POA: Insufficient documentation

## 2021-07-06 DIAGNOSIS — J449 Chronic obstructive pulmonary disease, unspecified: Secondary | ICD-10-CM | POA: Diagnosis not present

## 2021-07-06 DIAGNOSIS — N183 Chronic kidney disease, stage 3 unspecified: Secondary | ICD-10-CM | POA: Insufficient documentation

## 2021-07-06 DIAGNOSIS — I517 Cardiomegaly: Secondary | ICD-10-CM | POA: Diagnosis not present

## 2021-07-06 DIAGNOSIS — Z7984 Long term (current) use of oral hypoglycemic drugs: Secondary | ICD-10-CM | POA: Insufficient documentation

## 2021-07-06 DIAGNOSIS — I2510 Atherosclerotic heart disease of native coronary artery without angina pectoris: Secondary | ICD-10-CM | POA: Diagnosis not present

## 2021-07-06 DIAGNOSIS — J45909 Unspecified asthma, uncomplicated: Secondary | ICD-10-CM | POA: Insufficient documentation

## 2021-07-06 DIAGNOSIS — R079 Chest pain, unspecified: Secondary | ICD-10-CM | POA: Diagnosis not present

## 2021-07-06 DIAGNOSIS — I5043 Acute on chronic combined systolic (congestive) and diastolic (congestive) heart failure: Secondary | ICD-10-CM | POA: Insufficient documentation

## 2021-07-06 DIAGNOSIS — E1122 Type 2 diabetes mellitus with diabetic chronic kidney disease: Secondary | ICD-10-CM | POA: Diagnosis not present

## 2021-07-06 DIAGNOSIS — R1011 Right upper quadrant pain: Secondary | ICD-10-CM | POA: Diagnosis not present

## 2021-07-06 DIAGNOSIS — M25511 Pain in right shoulder: Secondary | ICD-10-CM | POA: Diagnosis not present

## 2021-07-06 DIAGNOSIS — Z79899 Other long term (current) drug therapy: Secondary | ICD-10-CM | POA: Insufficient documentation

## 2021-07-06 DIAGNOSIS — Z951 Presence of aortocoronary bypass graft: Secondary | ICD-10-CM | POA: Diagnosis not present

## 2021-07-06 LAB — CBC WITH DIFFERENTIAL/PLATELET
Abs Immature Granulocytes: 0.03 10*3/uL (ref 0.00–0.07)
Basophils Absolute: 0.1 10*3/uL (ref 0.0–0.1)
Basophils Relative: 1 %
Eosinophils Absolute: 0.1 10*3/uL (ref 0.0–0.5)
Eosinophils Relative: 1 %
HCT: 50.5 % — ABNORMAL HIGH (ref 36.0–46.0)
Hemoglobin: 16.3 g/dL — ABNORMAL HIGH (ref 12.0–15.0)
Immature Granulocytes: 0 %
Lymphocytes Relative: 30 %
Lymphs Abs: 2.6 10*3/uL (ref 0.7–4.0)
MCH: 28 pg (ref 26.0–34.0)
MCHC: 32.3 g/dL (ref 30.0–36.0)
MCV: 86.8 fL (ref 80.0–100.0)
Monocytes Absolute: 1 10*3/uL (ref 0.1–1.0)
Monocytes Relative: 12 %
Neutro Abs: 5.1 10*3/uL (ref 1.7–7.7)
Neutrophils Relative %: 56 %
Platelets: 168 10*3/uL (ref 150–400)
RBC: 5.82 MIL/uL — ABNORMAL HIGH (ref 3.87–5.11)
RDW: 21.4 % — ABNORMAL HIGH (ref 11.5–15.5)
WBC: 8.9 10*3/uL (ref 4.0–10.5)
nRBC: 0 % (ref 0.0–0.2)

## 2021-07-06 LAB — COMPREHENSIVE METABOLIC PANEL
ALT: 13 U/L (ref 0–44)
AST: 18 U/L (ref 15–41)
Albumin: 3.3 g/dL — ABNORMAL LOW (ref 3.5–5.0)
Alkaline Phosphatase: 130 U/L — ABNORMAL HIGH (ref 38–126)
Anion gap: 10 (ref 5–15)
BUN: 44 mg/dL — ABNORMAL HIGH (ref 8–23)
CO2: 21 mmol/L — ABNORMAL LOW (ref 22–32)
Calcium: 9.1 mg/dL (ref 8.9–10.3)
Chloride: 105 mmol/L (ref 98–111)
Creatinine, Ser: 1.55 mg/dL — ABNORMAL HIGH (ref 0.44–1.00)
GFR, Estimated: 35 mL/min — ABNORMAL LOW (ref >60)
Glucose, Bld: 96 mg/dL (ref 70–99)
Potassium: 4 mmol/L (ref 3.5–5.1)
Sodium: 136 mmol/L (ref 135–145)
Total Bilirubin: 0.8 mg/dL (ref 0.3–1.2)
Total Protein: 7.2 g/dL (ref 6.5–8.1)

## 2021-07-06 LAB — LIPASE, BLOOD: Lipase: 31 U/L (ref 11–51)

## 2021-07-06 LAB — TROPONIN I (HIGH SENSITIVITY): Troponin I (High Sensitivity): 15 ng/L (ref <18)

## 2021-07-06 NOTE — Discharge Instructions (Addendum)
Your work-up in the emergency room including EKG, chest x-ray, and blood work all of which were without cause of your chest pain.  You did only have one of the 2 heart enzymes we will do in the emergency room the last troponin.  We did talk about waiting to have the second 1 done but you report you need to go home because it is getting dark.  He also had an ultrasound of your gallbladder which was without gallbladder infection.  I recommend you call your primary care provider and schedule a follow-up appointment.  Your chest x-ray was also without rib fracture or other acute findings.

## 2021-07-06 NOTE — ED Notes (Signed)
Called lab to request add on for troponin

## 2021-07-06 NOTE — ED Provider Notes (Signed)
[facility] EMERGENCY DEPARTMENT Provider Note   CSN: [id number] Arrival date & time: 07/06/21  1457     History Chief Complaint  Patient presents with   Chest Pain    [name] [name] is a 75 y.o. female.  75 year old female with a past medical history of A. fib, COPD, CAD s/p CABG presents today for evaluation of pain under her right breast for 2-day duration.  She denies any known injury aside from lifting groceries into the house which may have been heavier than her usual weight that she would left.  Her pain radiates to right posterior shoulder blade.  She denies lightheadedness, nausea, diaphoresis.  She reports she has taken Tylenol with relief of the pain.  She reports she called her cardiology clinic and the nurse asked her to come to the emergency room for evaluation.  The history is provided by the patient. No language interpreter was used.      Past Medical History:  Diagnosis Date   ([place]) heart failure with improved ejection fraction ([place])    a. 07/2018 Echo: EF 30-35%; b. 11/2019 Echo: EF 55-60%, no rwma, Gr2 DD, Nl RV size/fxn. Mild BAE. Mild MR/AI.   Arthritis    Asthma    Atopic dermatitis    CAD (coronary artery disease)    a. 2016 s/p CABG x 2 (LIMA->LAD, VG->OM); b. 08/2018 MV: EF 44%, no ischemia/infact.   Cardiac arrest ([place]) 10/2014   Cardiomyopathy, ischemic    a. 07/2018 Echo: EF 30-35%; 11/2019 Echo: EF 55-60%.   Carotid arterial disease ([place])    a. 11/2019 Carotid U/S   CHF (congestive heart failure) (HCC)    CKD (chronic kidney disease), stage III (HCC)    COPD (chronic obstructive pulmonary disease) (HCC)    Diabetes mellitus without complication ([place])    Dysrhythmia    Esophageal dilatation 2013   GERD (gastroesophageal reflux disease)    Gout    Headache    Hypertension    Hypokalemia    Idiopathic angioedema    LGI bleed 08/06/2017   a. felt to be hemorrhoidal during that admission (no drop in Hgb).   Lower back pain     Paroxysmal atrial fibrillation (HCC)    a. Dx 2016-->h/o difficult to control rates (complicated by noncompliance), not felt to be a candidate for ablation or antiarrhythmic due to noncompliance; b. Recurrent AF 2021 - converted w/ IV dilt; c. CHA2DS2VASc = 7-->Xarelto.   Personal history of noncompliance with medical treatment, presenting hazards to health    Prediabetes    Presence of permanent cardiac pacemaker    S/P CABG x 2 with clipping of LA appendage 11/14/2014   LIMA to LAD, SVG to OM, EVH via right thigh   Urine incontinence    Uterine fibroid     Patient Active Problem List   Diagnosis Date Noted   Hypertensive urgency 06/08/2021   Elevated troponin 02/10/2021   Acute respiratory failure with hypoxia ([place]) 12/08/2020   Hypotension 12/08/2020   Hypoalbuminemia 12/08/2020   Obstructive sleep apnea 09/21/2020   Atrial fibrillation with rapid ventricular response ([place]) 08/13/2020   Acute lower GI bleeding 02/10/2020   Tobacco dependence 02/10/2020   Obesity (BMI 30.0-34.9) 02/10/2020   Acute upper GI bleeding 10/26/2019   COPD (chronic obstructive pulmonary disease) ([place]) 10/26/2019   Acute GI bleeding 10/09/2019   Acute blood loss anemia 10/09/2019   Postmenopausal vaginal bleeding 01/04/2018   Snoring 01/04/2018   Acute on chronic combined systolic and  diastolic CHF (congestive heart failure) ([place])    Noncompliance 10/16/2017   Acute on chronic congestive heart failure (HCC)    Thrombocytopenia (HCC) 09/14/2017   Acute drug-induced gout of right foot    Medication noncompliance due to cognitive impairment 63/78/5885   Acute diastolic CHF (congestive heart failure) ([place]) 08/06/2017   LGI bleed, likely hemorrhoids 08/06/2017   Persistent atrial fibrillation ([place]) 08/04/2017   Paroxysmal atrial fibrillation ([place]) 02/08/2017   Cardiomyopathy, ischemic 02/08/2017   Dysphagia 02/08/2017   CKD (chronic kidney disease), stage III ([place]) 01/30/2017   Pain in shoulder  02/08/2016   Breast pain, left 02/08/2016   Bilateral arm numbness and tingling while sleeping 02/08/2016   Painful lumpy left breast 09/23/2015   [place] intertrigo 02/11/2015   S/P CABG x 2 11/14/2014   Accelerated hypertension    Cardiac arrest ([place]) 11/04/2014   Left main coronary artery disease 11/04/2014   Coronary artery disease due to lipid rich plaque    Acute respiratory failure with hypoxemia [name])    Essential hypertension    Atrial fibrillation with RVR (HCC)    Hypokalemia 10/30/2014   Chronic combined systolic and diastolic CHF (congestive heart failure) ([place]) 10/30/2014   NSTEMI (non-ST elevated myocardial infarction) ([place])    DOE (dyspnea on exertion)    CAP (community acquired pneumonia) 10/29/2014    Past Surgical History:  Procedure Laterality Date   AV NODE ABLATION N/A 01/18/2021   Procedure: AV NODE ABLATION;  Surgeon: [place] Lance, MD;  Location: [place] CV LAB;  Service: Cardiovascular;  Laterality: N/A;   BALLOON DILATION N/A 10/10/2019   Procedure: BALLOON DILATION;  Surgeon: Otis Brace, MD;  Location: [facility] ENDOS[facility];  Service: Gastroenterology;  Laterality: N/A;   BIOPSY  10/10/2019   Procedure: BIOPSY;  Surgeon: Otis Brace, MD;  Location: [place];  Service: Gastroenterology;;   BIOPSY  10/11/2019   Procedure: BIOPSY;  Surgeon: Otis Brace, MD;  Location: [place];  Service: Gastroenterology;;   CARDIOVERSION N/A 11/18/2014   Procedure: CARDIOVERSION;  Surgeon: [name], MD;  Location: [place];  Service: Cardiovascular;  Laterality: N/A;   CARDIOVERSION N/A 12/25/2017   Procedure: CARDIOVERSION;  Surgeon: [name], MD;  Location: [place];  Service: Cardiovascular;  Laterality: N/A;   CLIPPING OF ATRIAL APPENDAGE N/A 11/14/2014   Procedure: CLIPPING OF ATRIAL APPENDAGE;  Surgeon: [name], MD;  Location: [place];  Service: Open Heart Surgery;  Laterality: N/A;   COLONOSCOPY  2013   COLONOSCOPY  WITH PROPOFOL N/A 10/11/2019   Procedure: COLONOSCOPY WITH PROPOFOL;  Surgeon: Otis Brace, MD;  Location: [place];  Service: Gastroenterology;  Laterality: N/A;   CORONARY ARTERY BYPASS GRAFT N/A 11/14/2014   Procedure: CORONARY ARTERY BYPASS GRAFTING (CABG)TIMES 2 USING LEFT INTERNAL MAMMARY ARTERY AND RIGHT SAPHENOUS VEIN HARVESTED ENDOSCOPICALLY;  Surgeon: [name], MD;  Location: [place];  Service: Open Heart Surgery;  Laterality: N/A;   ESOPHAGOGASTRODUODENOSCOPY (EGD) WITH PROPOFOL N/A 10/10/2019   Procedure: ESOPHAGOGASTRODUODENOSCOPY (EGD) WITH PROPOFOL;  Surgeon: Otis Brace, MD;  Location: [place];  Service: Gastroenterology;  Laterality: N/A;   ESOPHAGOGASTRODUODENOSCOPY (EGD) WITH PROPOFOL N/A 10/27/2019   Procedure: ESOPHAGOGASTRODUODENOSCOPY (EGD) WITH PROPOFOL;  Surgeon: Ronnette Juniper, MD;  Location: [place];  Service: Gastroenterology;  Laterality: N/A;   GIVENS CAPSULE STUDY N/A 10/27/2019   Procedure: GIVENS CAPSULE STUDY;  Surgeon: Ronnette Juniper, MD;  Location: [place];  Service: Gastroenterology;  Laterality: N/A;   INSERT / REPLACE / REMOVE PACEMAKER  LEFT HEART CATHETERIZATION WITH CORONARY ANGIOGRAM N/A 11/04/2014   Procedure: LEFT HEART CATHETERIZATION WITH CORONARY ANGIOGRAM;  Surgeon: Troy Sine, MD;  Location: [facility] CATH LAB;  Service: Cardiovascular;  Laterality: N/A;   PACEMAKER IMPLANT N/A 01/18/2021   Procedure: PACEMAKER IMPLANT;  Surgeon: [place] Lance, MD;  Location: [place] CV LAB;  Service: Cardiovascular;  Laterality: N/A;   PACEMAKER IMPLANT Left    POLYPECTOMY  10/11/2019   Procedure: POLYPECTOMY;  Surgeon: Otis Brace, MD;  Location: [place] ENDOSCOPY;  Service: Gastroenterology;;   TEE WITHOUT CARDIOVERSION N/A 11/14/2014   Procedure: TRANSESOPHAGEAL ECHOCARDIOGRAM (TEE);  Surgeon: [name], MD;  Location: [place];  Service: Open Heart Surgery;  Laterality: N/A;   TEE WITHOUT CARDIOVERSION N/A 12/25/2017    Procedure: TRANSESOPHAGEAL ECHOCARDIOGRAM (TEE);  Surgeon: [name], MD;  Location: [facility] ENDOSCOPY;  Service: Cardiovascular;  Laterality: N/A;   TEMPORARY PACEMAKER INSERTION  11/04/2014   Procedure: TEMPORARY PACEMAKER INSERTION;  Surgeon: Troy Sine, MD;  Location: [facility] CATH LAB;  Service: Cardiovascular;;   TOOTH EXTRACTION       OB History   No obstetric history on file.     Family History  Problem Relation Age of Onset   Cancer Mother        LYMPHOMA   Heart disease Father    Other Father        TB   CVA Sister    Prostate cancer Brother 28   Diabetes Brother     Social History   Tobacco Use   Smoking status: Every Day    Packs/day: 0.50    Years: 56.00    Pack years: 28.00    Types: Cigarettes   Smokeless tobacco: Never   Tobacco comments:    smoking 2 cigarettes/day as of 10/23/20  Vaping Use   Vaping Use: Never used  Substance Use Topics   Alcohol use: No    Alcohol/week: 0.0 standard drinks   Drug use: No    Home Medications Prior to Admission medications   Medication Sig Start Date End Date Taking? Authorizing Provider  acetaminophen (TYLENOL) 325 MG tablet Take 2 tablets (650 mg total) by mouth every 4 (four) hours as needed for headache or mild pain. 12/09/20   Mikhail, Velta Addison, DO  allopurinol (ZYLOPRIM) 300 MG tablet Take 300 mg by mouth daily.    [provider]  atorvastatin (LIPITOR) 10 MG tablet Take 10 mg by mouth at bedtime. 01/22/21   [provider]  Cholecalciferol (VITAMIN D) 50 MCG (2000 UT) CAPS Take 1 capsule by mouth daily.    [provider]  losartan (COZAAR) 50 MG tablet Take 1 tablet (50 mg total) by mouth daily. 06/21/21   Jerline Pain, MD  meclizine (ANTIVERT) 12.5 MG tablet Take 1 tablet (12.5 mg total) by mouth every 6 (six) hours as needed for dizziness. 05/18/21   [place]ford, Maricela Bo, FNP  metFORMIN (GLUCOPHAGE) 500 MG tablet Take 500 mg by mouth daily with breakfast. Daily with a meal     [provider]  metoprolol succinate (TOPROL-XL) 100 MG 24 hr tablet Take 1 tablet (100 mg total) by mouth daily. Take with or immediately following a meal. Patient taking differently: Take 100 mg by mouth daily. 04/30/21 07/29/21  [place] Lance, MD  nystatin (MYCOSTATIN/NYSTOP) powder Apply 1 application topically 3 (three) times daily. 04/23/21   [place]ford, Maricela Bo, FNP  pantoprazole (PROTONIX) 40 MG tablet Take 40 mg by mouth daily.    [provider]  Prednisol Ace-Moxiflox-Bromfen 1-0.5-0.075 % SUSP Place 1 drop into the right eye 4 (four) times daily. 05/04/21   [provider]  rivaroxaban (XARELTO) 20 MG TABS tablet Take 1 tablet (20 mg total) by mouth daily with supper. Resume from 11/01/2019 11/01/19   Aline August, MD  spironolactone (ALDACTONE) 25 MG tablet Take 1 tablet (25 mg total) by mouth daily. 06/21/21   [place], Maricela Bo, FNP  Tiotropium Bromide Monohydrate (SPIRIVA RESPIMAT) 2.5 MCG/ACT AERS Inhale 2 puffs into the lungs daily. 10/23/20   Parrett, Fonnie Mu, NP  torsemide (DEMADEX) 20 MG tablet Take 2 tablets (40 mg total) by mouth daily. 06/12/21   Mercy Riding, MD    Allergies    Bee venom, Ivp dye [iodinated diagnostic agents], Ace inhibitors, Atenolol, Codeine, Entresto [sacubitril-valsartan], Penicillin g, Shrimp [shellfish allergy], Simvastatin, and Jardiance [empagliflozin]  Review of Systems   Review of Systems  Constitutional:  Negative for activity change, chills, diaphoresis and fever.  Respiratory:  Negative for cough, chest tightness and shortness of breath.   Cardiovascular:  Positive for chest pain. Negative for palpitations and leg swelling.  Gastrointestinal:  Negative for abdominal pain, nausea and vomiting.  Genitourinary:  Negative for decreased urine volume and difficulty urinating.  Neurological:  Negative for weakness and light-headedness.  All other systems reviewed and are negative.  Physical Exam Updated Vital Signs BP  119/73 (BP Location: Left Arm)   Pulse 69   Temp 98 F (36.7 C)   Resp 17   SpO2 100%   Physical Exam Vitals and nursing note reviewed.  Constitutional:      General: She is not in acute distress.    Appearance: Normal appearance. She is not ill-appearing.  HENT:     Head: Normocephalic and atraumatic.     Nose: Nose normal.  Eyes:     General: No scleral icterus.    Extraocular Movements: Extraocular movements intact.     Conjunctiva/sclera: Conjunctivae normal.  Cardiovascular:     Rate and Rhythm: Normal rate and regular rhythm.     Pulses: Normal pulses.     Heart sounds: Normal heart sounds.  Pulmonary:     Effort: Pulmonary effort is normal. No respiratory distress.     Breath sounds: Normal breath sounds. No wheezing or rales.  Abdominal:     General: There is no distension.     Tenderness: There is no abdominal tenderness.  Musculoskeletal:        General: Normal range of motion.     Cervical back: Normal range of motion.     Right lower leg: No edema.     Left lower leg: No edema.  Skin:    General: Skin is warm and dry.  Neurological:     General: No focal deficit present.     Mental Status: She is alert. Mental status is at baseline.    ED Results / Procedures / Treatments   Labs (all labs ordered are listed, but only abnormal results are displayed) Labs Reviewed  CBC WITH DIFFERENTIAL/PLATELET - Abnormal; Notable for the following components:      Result Value   RBC 5.82 (*)    Hemoglobin 16.3 (*)    HCT 50.5 (*)    RDW 21.4 (*)    All other components within normal limits  COMPREHENSIVE METABOLIC PANEL  LIPASE, BLOOD    EKG None  Radiology No results found.  Procedures Procedures   Medications Ordered in ED Medications - No data to  display  ED Course  I have reviewed the triage vital signs and the nursing notes.  Pertinent labs & imaging results that were available during my care of the patient were reviewed by me and considered in  my medical decision making (see chart for details).    MDM Rules/Calculators/A&P                           75 year old female with past medical history of A. fib, COPD, CAD s/p CABG presents today for evaluation of pain underneath her right breast of 2-day duration.  She denies any known injury but does recall lifting groceries into the house that are heavier than usual.  Pain does radiate to right posterior shoulder blade but not to either upper extremity, or to her jaw.  She is without nausea, vomiting, lightheadedness, diaphoresis.  She presents to the emergency room for cardiac work-up given her history of CAD.  CBC without leukocytosis, CMP with creatinine 1.55 is around her baseline, lipase within normal limits, troponin of 15, right upper quadrant ultrasound without acute cholecystitis, chest x-ray without acute cardiopulmonary findings or rib fractures.  Discussion had with patient to wait for second troponin given her cardiac history.  Patient refuses and states she has to go before it gets dark.  Patient will sign out AMA and follow-up with her primary care provider.  Final Clinical Impression(s) / ED Diagnoses Final diagnoses:  None    Rx / DC Orders ED Discharge Orders     None        Evlyn Courier, PA-C 07/06/21 1802    Regan Lemming, MD 07/06/21 2242

## 2021-07-06 NOTE — ED Provider Notes (Cosign Needed)
Emergency Medicine Provider Triage Evaluation Note  [name] [name] , a 75 y.o. female  was evaluated in triage.  Pt complains of abdominal pain.  She states that for 2 days she has had right upper quadrant abdominal pain that she states is under her right breast.  She states that it is intermittent and crampy.  She denies nausea, vomiting, diarrhea or fevers.  She denies chest pain or shortness of breath.  Denies trauma to her abdomen  Review of Systems  Positive: See above Negative:   Physical Exam  BP 119/73 (BP Location: Left Arm)   Pulse 69   Temp 98 F (36.7 C)   Resp 17   SpO2 100%  Gen:   Awake, no distress   Resp:  Normal effort,  MSK:   Moves extremities without difficulty  Other:  Abdomen is rounded, soft.  Positive bowel sounds.  Right upper quadrant tenderness to palpation.  She does have a large what appears to be ecchymoses to right and left abdomen.  She states that this is left over discoloration from a rash that she had when she had her pacemaker placed.  Denies trauma to her abdomen.  Medical Decision Making  Medically screening exam initiated at 3:26 PM.  Appropriate orders placed.  SINDA LEEDOM was informed that the remainder of the evaluation will be completed by another provider, this initial triage assessment does not replace that evaluation, and the importance of remaining in the ED until their evaluation is complete.     Mickie Hillier, PA-C 07/06/21 1527

## 2021-07-06 NOTE — ED Triage Notes (Signed)
Pt c/o pain under her right breast x 2 days. C/o radiation to her back. Denies shortness of breath.

## 2021-07-06 NOTE — ED Notes (Signed)
Pt transported to ultrasound.

## 2021-07-06 NOTE — ED Notes (Signed)
Pt returned from ultrasound

## 2021-07-06 NOTE — ED Notes (Signed)
Pt wishes to leave AMA. Provider notified

## 2021-07-12 ENCOUNTER — Telehealth: Payer: Self-pay | Admitting: Internal Medicine

## 2021-07-12 ENCOUNTER — Other Ambulatory Visit: Payer: Self-pay

## 2021-07-12 DIAGNOSIS — I10 Essential (primary) hypertension: Secondary | ICD-10-CM | POA: Diagnosis not present

## 2021-07-12 DIAGNOSIS — K219 Gastro-esophageal reflux disease without esophagitis: Secondary | ICD-10-CM | POA: Diagnosis not present

## 2021-07-12 DIAGNOSIS — E7800 Pure hypercholesterolemia, unspecified: Secondary | ICD-10-CM | POA: Diagnosis not present

## 2021-07-12 DIAGNOSIS — J441 Chronic obstructive pulmonary disease with (acute) exacerbation: Secondary | ICD-10-CM | POA: Diagnosis not present

## 2021-07-12 DIAGNOSIS — N183 Chronic kidney disease, stage 3 unspecified: Secondary | ICD-10-CM | POA: Diagnosis not present

## 2021-07-12 DIAGNOSIS — M1990 Unspecified osteoarthritis, unspecified site: Secondary | ICD-10-CM | POA: Diagnosis not present

## 2021-07-12 DIAGNOSIS — E1122 Type 2 diabetes mellitus with diabetic chronic kidney disease: Secondary | ICD-10-CM | POA: Diagnosis not present

## 2021-07-12 DIAGNOSIS — J449 Chronic obstructive pulmonary disease, unspecified: Secondary | ICD-10-CM | POA: Diagnosis not present

## 2021-07-12 DIAGNOSIS — H269 Unspecified cataract: Secondary | ICD-10-CM | POA: Diagnosis not present

## 2021-07-12 DIAGNOSIS — M8580 Other specified disorders of bone density and structure, unspecified site: Secondary | ICD-10-CM | POA: Diagnosis not present

## 2021-07-12 MED ORDER — LOSARTAN POTASSIUM 50 MG PO TABS: 50.0000 mg | ORAL_TABLET | Freq: Every day | ORAL | 3 refills | Status: DC

## 2021-07-12 NOTE — Telephone Encounter (Signed)
Sherilyn Dacosta Chronic Care Management RN called. She wanted to confirm the medications the patient is taking.   She works with the patient's Pharmacy to make sure all her medications are up to date. The Chronic Care Management team reaches out to her every month, and at this time she advised [facility] that she was taken off one of her medications by her Cardiologist.   The Care Management RN said that she does not have the best memory, so it is best to contact [place] for follow-up

## 2021-07-12 NOTE — Telephone Encounter (Signed)
*  STAT* If patient is at the pharmacy, call can be transferred to refill team.   1. Which medications need to be refilled? (please list name of each medication and dose if known)  losartan (COZAAR) 50 MG tablet  2. Which pharmacy/location (including street and city if local pharmacy) is medication to be sent to? [facility] - [place], [place] - [place] [address]  3. Do they need a 30 day or 90 day supply? 90 with refills  Pharmacy said new rx was not received for the increased dose

## 2021-07-12 NOTE — Telephone Encounter (Signed)
Refills sent for Losartan 50 mg daily to pt's preferred pharmacy

## 2021-07-13 ENCOUNTER — Telehealth: Payer: Self-pay | Admitting: Cardiology

## 2021-07-13 NOTE — Telephone Encounter (Signed)
Pt is supposed to be discontinuing a medication and is not sure what that was... please advise

## 2021-07-13 NOTE — Telephone Encounter (Signed)
Returned call to [place].  Gave Pt current med list.  Advised Pt is being managed by heart failure-gave number to heart failure clinic.

## 2021-07-14 ENCOUNTER — Encounter (HOSPITAL_COMMUNITY): Payer: HMO | Admitting: Internal Medicine

## 2021-07-30 ENCOUNTER — Encounter (HOSPITAL_COMMUNITY): Payer: Self-pay | Admitting: Internal Medicine

## 2021-07-30 ENCOUNTER — Other Ambulatory Visit: Payer: Self-pay

## 2021-07-30 ENCOUNTER — Ambulatory Visit (HOSPITAL_BASED_OUTPATIENT_CLINIC_OR_DEPARTMENT_OTHER)
Admission: RE | Admit: 2021-07-30 | Discharge: 2021-07-30 | Disposition: A | Discharge status: Home / Self Care | Payer: HMO | Source: Ambulatory Visit | Attending: Family Medicine | Admitting: Family Medicine

## 2021-07-30 ENCOUNTER — Ambulatory Visit (HOSPITAL_COMMUNITY)
Admission: RE | Admit: 2021-07-30 | Discharge: 2021-07-30 | Disposition: A | Discharge status: Home / Self Care | Payer: HMO | Source: Ambulatory Visit | Attending: Internal Medicine | Admitting: Internal Medicine

## 2021-07-30 VITALS — BP 109/78 | HR 73 | Wt 193.6 lb

## 2021-07-30 DIAGNOSIS — Z720 Tobacco use: Secondary | ICD-10-CM | POA: Diagnosis not present

## 2021-07-30 DIAGNOSIS — E1122 Type 2 diabetes mellitus with diabetic chronic kidney disease: Secondary | ICD-10-CM | POA: Insufficient documentation

## 2021-07-30 DIAGNOSIS — Z79899 Other long term (current) drug therapy: Secondary | ICD-10-CM | POA: Diagnosis not present

## 2021-07-30 DIAGNOSIS — F17210 Nicotine dependence, cigarettes, uncomplicated: Secondary | ICD-10-CM | POA: Insufficient documentation

## 2021-07-30 DIAGNOSIS — I2510 Atherosclerotic heart disease of native coronary artery without angina pectoris: Secondary | ICD-10-CM | POA: Insufficient documentation

## 2021-07-30 DIAGNOSIS — I130 Hypertensive heart and chronic kidney disease with heart failure and stage 1 through stage 4 chronic kidney disease, or unspecified chronic kidney disease: Secondary | ICD-10-CM | POA: Diagnosis not present

## 2021-07-30 DIAGNOSIS — N1831 Chronic kidney disease, stage 3a: Secondary | ICD-10-CM | POA: Diagnosis not present

## 2021-07-30 DIAGNOSIS — I4821 Permanent atrial fibrillation: Secondary | ICD-10-CM | POA: Insufficient documentation

## 2021-07-30 DIAGNOSIS — Z950 Presence of cardiac pacemaker: Secondary | ICD-10-CM | POA: Diagnosis not present

## 2021-07-30 DIAGNOSIS — Z7901 Long term (current) use of anticoagulants: Secondary | ICD-10-CM | POA: Diagnosis not present

## 2021-07-30 DIAGNOSIS — I4819 Other persistent atrial fibrillation: Secondary | ICD-10-CM | POA: Diagnosis not present

## 2021-07-30 DIAGNOSIS — I083 Combined rheumatic disorders of mitral, aortic and tricuspid valves: Secondary | ICD-10-CM | POA: Insufficient documentation

## 2021-07-30 DIAGNOSIS — J449 Chronic obstructive pulmonary disease, unspecified: Secondary | ICD-10-CM | POA: Diagnosis not present

## 2021-07-30 DIAGNOSIS — I255 Ischemic cardiomyopathy: Secondary | ICD-10-CM | POA: Diagnosis not present

## 2021-07-30 DIAGNOSIS — I5042 Chronic combined systolic (congestive) and diastolic (congestive) heart failure: Secondary | ICD-10-CM | POA: Diagnosis not present

## 2021-07-30 DIAGNOSIS — E785 Hyperlipidemia, unspecified: Secondary | ICD-10-CM | POA: Insufficient documentation

## 2021-07-30 DIAGNOSIS — Z951 Presence of aortocoronary bypass graft: Secondary | ICD-10-CM | POA: Diagnosis not present

## 2021-07-30 DIAGNOSIS — I10 Essential (primary) hypertension: Secondary | ICD-10-CM | POA: Diagnosis not present

## 2021-07-30 LAB — CBC
HCT: 48.8 % — ABNORMAL HIGH (ref 36.0–46.0)
Hemoglobin: 16.3 g/dL — ABNORMAL HIGH (ref 12.0–15.0)
MCH: 29.1 pg (ref 26.0–34.0)
MCHC: 33.4 g/dL (ref 30.0–36.0)
MCV: 87 fL (ref 80.0–100.0)
Platelets: 122 10*3/uL — ABNORMAL LOW (ref 150–400)
RBC: 5.61 MIL/uL — ABNORMAL HIGH (ref 3.87–5.11)
RDW: 19.3 % — ABNORMAL HIGH (ref 11.5–15.5)
WBC: 7.7 10*3/uL (ref 4.0–10.5)
nRBC: 0 % (ref 0.0–0.2)

## 2021-07-30 LAB — ECHOCARDIOGRAM COMPLETE
AR max vel: 2.79 cm2
AV Area VTI: 2.49 cm2
AV Area mean vel: 2.68 cm2
AV Mean grad: 3 mmHg
AV Peak grad: 7 mmHg
Ao pk vel: 1.32 m/s
Calc EF: 25.9 %
MV VTI: 2.64 cm2
S' Lateral: 4.5 cm
Single Plane A2C EF: 25.3 %
Single Plane A4C EF: 26.2 %

## 2021-07-30 LAB — BASIC METABOLIC PANEL
Anion gap: 6 (ref 5–15)
BUN: 33 mg/dL — ABNORMAL HIGH (ref 8–23)
CO2: 22 mmol/L (ref 22–32)
Calcium: 9.5 mg/dL (ref 8.9–10.3)
Chloride: 111 mmol/L (ref 98–111)
Creatinine, Ser: 1.46 mg/dL — ABNORMAL HIGH (ref 0.44–1.00)
GFR, Estimated: 37 mL/min — ABNORMAL LOW (ref >60)
Glucose, Bld: 97 mg/dL (ref 70–99)
Potassium: 3.9 mmol/L (ref 3.5–5.1)
Sodium: 139 mmol/L (ref 135–145)

## 2021-07-30 LAB — BRAIN NATRIURETIC PEPTIDE: B Natriuretic Peptide: 247.6 pg/mL — ABNORMAL HIGH (ref 0.0–100.0)

## 2021-07-30 NOTE — Progress Notes (Signed)
Advanced Heart Failure Clinic Note  PCP: Dr Lysle Rubens  Primary Cardiologist: [name]  EP: Dr Lovena Le HF MD: Dr Haroldine Laws   HPI: [name] [name] is a 75 y.o. female with h/o HTN, former tobacco abuse, permanent AF, CAD s/p CABG x 2 (LIMA to LAD and SVG to OM) in 2016, Chronic combined HF, Ischemic CM, HLD, and Biotronik PPM.   She was initially noted to be in atrial fibrillation in 2016 with a reduced LVEF, leading to ischemic workup with a cardiac cath showing severe multivessel CAD with LM dz -> CABG x2 utilizing LIMA to LAD, SVG to OM.   Admitted 1/19 for AF with RVR in setting of Influenza A. EP was consulted to discuss plans for Tikosyn vs. ablation vs. medication rate control however, due to her known medication noncompliance she was found to not be an ablation nor antiarrhythmic candidate   2019 back in Afib. She was started on amiodarone & Entresto.  She underwent successful atrial flutter TEE/DCCV on 12/25/17.  Later went back in A fib.   Intolerant Entresto due to chest pain.    Admitted 3/21 for iron-def anemia. Hgb 5.4 EGD/colonoscopy which were unremarkable and capsule endoscopy which showed duodenal ulcer with bleeding and stigmata.  GI recommended oral Protonix and outpatient follow-up.  Admitted 6/22 for AV node ablation and PPM due to uncontrolled heart rates with A fib. Underwent Biotronix PPM. Toprol XL and digoxin stopped. Carvedilol 25 mg bid started. She was discharged 01/19/21.    Admitted 12/08/81 with a/c systolic heart failure. Diuresed with IV lasix and transitioned to torsemide 40 mg daily. EF 25-30% with severe MR/TR.    Admitted 10/22 for COPD exacerbation with a component of A/C CHF, ran out of torsemide several days prior  Here for HF f/u. She says she feels good. Denies SOB, orthopnea or PND. Taking her meds. Continues to smoke a few cigarettes per day.   Echo today 07/30/21: EF 25-30% Moderate RV dysfunction. Severe biatrial enlargement.  Severe central MR/TR. Personally reviewed   Cardiac Studies   Echo 3/16 EF 35-40%. Echo 10/17 EF improved to 50-55%. Echo 5/18 EF stable 50-55% Echo 3/19 LVEF 30-35%, Moderate LVH, Mild AI, Moderate regurgitation, Severe LAE, Moderately reduced RV, Severe RAE, PA peak pressure 50 mm Hg.   (Decreased from Echo 01/11/2017 with LVEF 50-55%) Echo 12/19 Ef 30-35% RV mildly dilated. Echo 4/21 EF 55-60%  Echo 11/21 EF 30-35% RV normal  Echo 4/22 EF 25-30% RV moderately reduced  Echo 6/22 EF 25-30% RV severely reduced  Stress Test 1/20- Low Risk EF 44%  ROS: All systems negative except as listed in HPI, PMH and Problem List.  SH:  Social History   Socioeconomic History   Marital status: Widowed    Spouse name: Not on file   Number of children: 1   Years of education: 71   Highest education level: Not on file  Occupational History   Occupation: RETIRED [employer]  Tobacco Use   Smoking status: Every Day    Packs/day: 0.50    Years: 56.00    Pack years: 28.00    Types: Cigarettes   Smokeless tobacco: Never   Tobacco comments:    smoking 2 cigarettes/day as of 10/23/20  Vaping Use   Vaping Use: Never used  Substance and Sexual Activity   Alcohol use: No  Alcohol/week: 0.0 standard drinks   Drug use: No   Sexual activity: Not on file  Other Topics Concern   Not on file  Social History Narrative   Patient reports it being difficult to pay for everything due to outstanding medical bills from hospital stay last year but states she is able to keep up with basic expenses.  Patient does have some concerns with her house's condition due to a water leak- had her roof repaired last month but has some leaking in the front which has affected her porch.  Patient owns her own car and is able to drive herself but has some concerns about the reliability of her car- has gotten taxi to the clinic for appointment in the past when her car broke down.   Social Determinants of Health    Financial Resource Strain: Not on file  Food Insecurity: Not on file  Transportation Needs: Not on file  Physical Activity: Not on file  Stress: Not on file  Social Connections: Not on file  Intimate Partner Violence: Not on file   FH:  Family History  Problem Relation Age of Onset   Cancer Mother        LYMPHOMA   Heart disease Father    Other Father        TB   CVA Sister    Prostate cancer Brother 36   Diabetes Brother    Past Medical History:  Diagnosis Date   ([place]) heart failure with improved ejection fraction ([place])    a. 07/2018 Echo: EF 30-35%; b. 11/2019 Echo: EF 55-60%, no rwma, Gr2 DD, Nl RV size/fxn. Mild BAE. Mild MR/AI.   Arthritis    Asthma    Atopic dermatitis    CAD (coronary artery disease)    a. 2016 s/p CABG x 2 (LIMA->LAD, VG->OM); b. 08/2018 MV: EF 44%, no ischemia/infact.   Cardiac arrest ([place]) 10/2014   Cardiomyopathy, ischemic    a. 07/2018 Echo: EF 30-35%; 11/2019 Echo: EF 55-60%.   Carotid arterial disease ([place])    a. 11/2019 Carotid U/S   CHF (congestive heart failure) (HCC)    CKD (chronic kidney disease), stage III (HCC)    COPD (chronic obstructive pulmonary disease) (HCC)    Diabetes mellitus without complication ([place])    Dysrhythmia    Esophageal dilatation 2013   GERD (gastroesophageal reflux disease)    Gout    Headache    Hypertension    Hypokalemia    Idiopathic angioedema    LGI bleed 08/06/2017   a. felt to be hemorrhoidal during that admission (no drop in Hgb).   Lower back pain    Paroxysmal atrial fibrillation (HCC)    a. Dx 2016-->h/o difficult to control rates (complicated by noncompliance), not felt to be a candidate for ablation or antiarrhythmic due to noncompliance; b. Recurrent AF 2021 - converted w/ IV dilt; c. CHA2DS2VASc = 7-->Xarelto.   Personal history of noncompliance with medical treatment, presenting hazards to health    Prediabetes    Presence of permanent cardiac pacemaker    S/P CABG x 2 with clipping  of LA appendage 11/14/2014   LIMA to LAD, SVG to OM, EVH via right thigh   Urine incontinence    Uterine fibroid    Current Outpatient Medications  Medication Sig Dispense Refill   acetaminophen (TYLENOL) 325 MG tablet Take 2 tablets (650 mg total) by mouth every 4 (four) hours as needed for headache or mild pain.     allopurinol (ZYLOPRIM)  100 MG tablet Take 100 mg by mouth daily.     atorvastatin (LIPITOR) 10 MG tablet Take 10 mg by mouth at bedtime.     losartan (COZAAR) 25 MG tablet Take 25 mg by mouth daily.     meclizine (ANTIVERT) 12.5 MG tablet Take 1 tablet (12.5 mg total) by mouth every 6 (six) hours as needed for dizziness. 30 tablet 0   metFORMIN (GLUCOPHAGE) 500 MG tablet Take 500 mg by mouth daily with breakfast. Daily with a meal     metoprolol succinate (TOPROL-XL) 100 MG 24 hr tablet Take 1 tablet (100 mg total) by mouth daily. Take with or immediately following a meal. 90 tablet 3   pantoprazole (PROTONIX) 40 MG tablet Take 40 mg by mouth daily.     Prednisol Ace-Moxiflox-Bromfen 1-0.5-0.075 % SUSP Place 1 drop into the right eye 4 (four) times daily.     rivaroxaban (XARELTO) 20 MG TABS tablet Take 1 tablet (20 mg total) by mouth daily with supper. Resume from 11/01/2019     spironolactone (ALDACTONE) 25 MG tablet Take 1 tablet (25 mg total) by mouth daily. 30 tablet 11   Tiotropium Bromide Monohydrate (SPIRIVA RESPIMAT) 2.5 MCG/ACT AERS Inhale 2 puffs into the lungs daily. (Patient taking differently: Inhale 2 puffs into the lungs as needed.) 1 g 5   TORSEMIDE PO Take 40 mg by mouth 2 (two) times daily.     No current facility-administered medications for this encounter.   BP 109/78    Pulse 73    Wt 87.8 kg (193 lb 9.6 oz)    SpO2 100%    BMI 30.32 kg/m   Wt Readings from Last 3 Encounters:  07/30/21 87.8 kg (193 lb 9.6 oz)  06/17/21 86.5 kg (190 lb 12.8 oz)  06/10/21 87.3 kg (192 lb 8 oz)   PHYSICAL EXAM: General:  Well appearing. No resp difficulty HEENT:  normal Neck: supple. no JVD. Carotids 2+ bilat; no bruits. No lymphadenopathy or thryomegaly appreciated. Cor: PMI nondisplaced. Regular rate & rhythm. 2/6 MR Lungs: clear decreased throughout Abdomen: soft, nontender, nondistended. No hepatosplenomegaly. No bruits or masses. Good bowel sounds. Extremities: no cyanosis, clubbing, rash, edema Neuro: alert & orientedx3, cranial nerves grossly intact. moves all 4 extremities w/o difficulty. Affect pleasant   ECG: AF 73 with RV pacing Personally reviewed   ASSESSMENT & PLAN:   1. Chronic Combined Systolic/Diastolic HF - Suspect etiology is combined Ischemic/NICM with AF - In 2021 EF improved to 55-60% - Echo (6/22): 25-30%, RV severely reduced, severe MR, severe TR. - Echo today 07/30/21: EF 25-30% Moderate RV dysfunction. Severe biatrial enlargement. Severe central MR/TR. Personally reviewed - Stable NYHA II. Volume ok  - Continue losartan 50 mg daily.  - Continue spironolactone 25 mg daily. - Continue torsemide 40 mg daily. - Continue metoprolol 100 mg daily (intolerant to Coreg due to HA).  - No SGLT2i with hx frequent yeast infections. - Labs today - Although LV dysfunction preceded RV pacing, I am concerned that chronic RV pacing may limit the ability of her LV to respond to GDMT. Will d/w Dr. Lovena Le   2. CAD - Hx CABG X 2 in 2016 (LIMA to LAD, SVG to OM) - No s/s angina - Continue statin.  - No asa since anticoagulated.   3. Mitral regurgitation - severe functional MR - Will discuss with Structural team regarding clip if/when symptoms progress   4. Permanent A fib  - Hx Biotronik dual chamber PPM/AV nodal ablation 06/22. -  Rate controlled - Continue Xarelto. No abnormal bleeding. - Concern for possible RV pacing CM. Check pacer to ascertain degree of RV pacing    5. CKD Stage III a - Creatinine baseline~ 1.3.  - Labs today   6. HTN -Personally reviewedBlood pressure well controlled. Continue current regimen.  7.  Tobacco Abuse  - Discussed smoking cessation.    She has been referred to [facility], however they have been unable to have reliable contact with patient and she has thus been discharged. May need to re-visit  [name], MD  12:44 PM

## 2021-07-30 NOTE — Patient Instructions (Signed)
No medication changes today  Labs today We will only contact you if something comes back abnormal or we need to make some changes. Otherwise no news is good news!  Your physician recommends that you schedule a follow-up appointment in: 4 months with Dr Haroldine Laws. Please call in February to schedule this appointment.   Please call office at [phone] option [phone] if you have any questions or concerns.   Do the following things EVERYDAY: Weigh yourself in the morning before breakfast. Write it down and keep it in a log. Take your medicines as prescribed Eat low salt foods--Limit salt (sodium) to 2000 mg per day.  Stay as active as you can everyday Limit all fluids for the day to less than 2 liters   At the [facility], you and your health needs are our priority. As part of our continuing mission to provide you with exceptional heart care, we have created designated Provider Care Teams. These Care Teams include your primary Cardiologist (physician) and Advanced Practice Providers (APPs- Physician Assistants and Nurse Practitioners) who all work together to provide you with the care you need, when you need it.   You may see any of the following providers on your designated Care Team at your next follow up: Dr [name] Dr Haynes Kerns, NP Lyda Jester, [place] [facility] [place], [place] Audry Riles, PharmD   Please be sure to bring in all your medications bottles to every appointment.

## 2021-07-30 NOTE — Progress Notes (Signed)
°  Echocardiogram 2D Echocardiogram has been performed.  [name] [name] 07/30/2021, 12:06 PM

## 2021-08-26 DIAGNOSIS — E1122 Type 2 diabetes mellitus with diabetic chronic kidney disease: Secondary | ICD-10-CM | POA: Diagnosis not present

## 2021-08-26 DIAGNOSIS — I071 Rheumatic tricuspid insufficiency: Secondary | ICD-10-CM | POA: Diagnosis not present

## 2021-08-26 DIAGNOSIS — I340 Nonrheumatic mitral (valve) insufficiency: Secondary | ICD-10-CM | POA: Diagnosis not present

## 2021-08-26 DIAGNOSIS — I25810 Atherosclerosis of coronary artery bypass graft(s) without angina pectoris: Secondary | ICD-10-CM | POA: Diagnosis not present

## 2021-08-26 DIAGNOSIS — J449 Chronic obstructive pulmonary disease, unspecified: Secondary | ICD-10-CM | POA: Diagnosis not present

## 2021-08-26 DIAGNOSIS — I10 Essential (primary) hypertension: Secondary | ICD-10-CM | POA: Diagnosis not present

## 2021-08-26 DIAGNOSIS — M109 Gout, unspecified: Secondary | ICD-10-CM | POA: Diagnosis not present

## 2021-08-26 DIAGNOSIS — N183 Chronic kidney disease, stage 3 unspecified: Secondary | ICD-10-CM | POA: Diagnosis not present

## 2021-08-26 DIAGNOSIS — E7800 Pure hypercholesterolemia, unspecified: Secondary | ICD-10-CM | POA: Diagnosis not present

## 2021-08-26 DIAGNOSIS — I509 Heart failure, unspecified: Secondary | ICD-10-CM | POA: Diagnosis not present

## 2021-08-26 DIAGNOSIS — I4891 Unspecified atrial fibrillation: Secondary | ICD-10-CM | POA: Diagnosis not present

## 2021-10-20 DIAGNOSIS — Z6829 Body mass index (BMI) 29.0-29.9, adult: Secondary | ICD-10-CM | POA: Diagnosis not present

## 2021-10-20 DIAGNOSIS — Z7984 Long term (current) use of oral hypoglycemic drugs: Secondary | ICD-10-CM | POA: Diagnosis not present

## 2021-10-20 DIAGNOSIS — E119 Type 2 diabetes mellitus without complications: Secondary | ICD-10-CM | POA: Diagnosis not present

## 2021-10-20 DIAGNOSIS — I5042 Chronic combined systolic (congestive) and diastolic (congestive) heart failure: Secondary | ICD-10-CM | POA: Diagnosis not present

## 2021-10-20 DIAGNOSIS — I110 Hypertensive heart disease with heart failure: Secondary | ICD-10-CM | POA: Diagnosis not present

## 2021-11-01 DIAGNOSIS — I493 Ventricular premature depolarization: Secondary | ICD-10-CM | POA: Diagnosis not present

## 2021-11-01 DIAGNOSIS — I4891 Unspecified atrial fibrillation: Secondary | ICD-10-CM | POA: Diagnosis not present

## 2021-11-09 DIAGNOSIS — E7800 Pure hypercholesterolemia, unspecified: Secondary | ICD-10-CM | POA: Diagnosis not present

## 2021-11-09 DIAGNOSIS — E1122 Type 2 diabetes mellitus with diabetic chronic kidney disease: Secondary | ICD-10-CM | POA: Diagnosis not present

## 2021-11-09 DIAGNOSIS — N183 Chronic kidney disease, stage 3 unspecified: Secondary | ICD-10-CM | POA: Diagnosis not present

## 2021-11-09 DIAGNOSIS — I10 Essential (primary) hypertension: Secondary | ICD-10-CM | POA: Diagnosis not present

## 2021-11-23 DIAGNOSIS — Z79899 Other long term (current) drug therapy: Secondary | ICD-10-CM | POA: Diagnosis not present

## 2021-11-23 DIAGNOSIS — R0609 Other forms of dyspnea: Secondary | ICD-10-CM | POA: Diagnosis not present

## 2021-11-23 DIAGNOSIS — N189 Chronic kidney disease, unspecified: Secondary | ICD-10-CM | POA: Diagnosis not present

## 2021-11-23 DIAGNOSIS — I4821 Permanent atrial fibrillation: Secondary | ICD-10-CM | POA: Diagnosis not present

## 2021-11-23 DIAGNOSIS — I5022 Chronic systolic (congestive) heart failure: Secondary | ICD-10-CM | POA: Diagnosis not present

## 2021-11-23 DIAGNOSIS — I2510 Atherosclerotic heart disease of native coronary artery without angina pectoris: Secondary | ICD-10-CM | POA: Diagnosis not present

## 2021-11-23 DIAGNOSIS — I340 Nonrheumatic mitral (valve) insufficiency: Secondary | ICD-10-CM | POA: Diagnosis not present

## 2021-11-23 DIAGNOSIS — I130 Hypertensive heart and chronic kidney disease with heart failure and stage 1 through stage 4 chronic kidney disease, or unspecified chronic kidney disease: Secondary | ICD-10-CM | POA: Diagnosis not present

## 2021-11-23 DIAGNOSIS — Z951 Presence of aortocoronary bypass graft: Secondary | ICD-10-CM | POA: Diagnosis not present

## 2021-11-23 DIAGNOSIS — R079 Chest pain, unspecified: Secondary | ICD-10-CM | POA: Diagnosis not present

## 2021-11-23 DIAGNOSIS — Z7901 Long term (current) use of anticoagulants: Secondary | ICD-10-CM | POA: Diagnosis not present

## 2021-12-14 ENCOUNTER — Telehealth: Payer: Self-pay

## 2021-12-14 NOTE — Telephone Encounter (Signed)
I spoke with the patient because her monitor has not transmitted since 02-16-2021. She is also concerned because the device moved in her chest. I got [place] to add her to [place], pa scheduled to get her device check since she has not been seen in office since 9/22. I also asked her to bring in her Biotronik monitor. The patient verbalized understanding and thanked me for my help. ?

## 2021-12-20 DIAGNOSIS — I480 Paroxysmal atrial fibrillation: Secondary | ICD-10-CM | POA: Diagnosis not present

## 2021-12-20 DIAGNOSIS — I700 Atherosclerosis of aorta: Secondary | ICD-10-CM | POA: Diagnosis not present

## 2021-12-20 DIAGNOSIS — Z7984 Long term (current) use of oral hypoglycemic drugs: Secondary | ICD-10-CM | POA: Diagnosis not present

## 2021-12-20 DIAGNOSIS — E119 Type 2 diabetes mellitus without complications: Secondary | ICD-10-CM | POA: Diagnosis not present

## 2021-12-22 ENCOUNTER — Ambulatory Visit (HOSPITAL_COMMUNITY)
Admission: RE | Admit: 2021-12-22 | Discharge: 2021-12-22 | Disposition: A | Discharge status: Home / Self Care | Payer: HMO | Source: Ambulatory Visit | Attending: Internal Medicine | Admitting: Internal Medicine

## 2021-12-22 ENCOUNTER — Encounter (HOSPITAL_COMMUNITY): Payer: Self-pay | Admitting: Internal Medicine

## 2021-12-22 VITALS — BP 140/80 | HR 93 | Wt 196.0 lb

## 2021-12-22 DIAGNOSIS — Z7984 Long term (current) use of oral hypoglycemic drugs: Secondary | ICD-10-CM | POA: Insufficient documentation

## 2021-12-22 DIAGNOSIS — E1122 Type 2 diabetes mellitus with diabetic chronic kidney disease: Secondary | ICD-10-CM | POA: Insufficient documentation

## 2021-12-22 DIAGNOSIS — J449 Chronic obstructive pulmonary disease, unspecified: Secondary | ICD-10-CM | POA: Diagnosis not present

## 2021-12-22 DIAGNOSIS — I340 Nonrheumatic mitral (valve) insufficiency: Secondary | ICD-10-CM | POA: Diagnosis not present

## 2021-12-22 DIAGNOSIS — I4821 Permanent atrial fibrillation: Secondary | ICD-10-CM | POA: Diagnosis not present

## 2021-12-22 DIAGNOSIS — Z79899 Other long term (current) drug therapy: Secondary | ICD-10-CM | POA: Diagnosis not present

## 2021-12-22 DIAGNOSIS — I5022 Chronic systolic (congestive) heart failure: Secondary | ICD-10-CM

## 2021-12-22 DIAGNOSIS — I081 Rheumatic disorders of both mitral and tricuspid valves: Secondary | ICD-10-CM | POA: Insufficient documentation

## 2021-12-22 DIAGNOSIS — Z951 Presence of aortocoronary bypass graft: Secondary | ICD-10-CM | POA: Insufficient documentation

## 2021-12-22 DIAGNOSIS — I130 Hypertensive heart and chronic kidney disease with heart failure and stage 1 through stage 4 chronic kidney disease, or unspecified chronic kidney disease: Secondary | ICD-10-CM | POA: Diagnosis not present

## 2021-12-22 DIAGNOSIS — I10 Essential (primary) hypertension: Secondary | ICD-10-CM

## 2021-12-22 DIAGNOSIS — I5042 Chronic combined systolic (congestive) and diastolic (congestive) heart failure: Secondary | ICD-10-CM | POA: Insufficient documentation

## 2021-12-22 DIAGNOSIS — N1831 Chronic kidney disease, stage 3a: Secondary | ICD-10-CM | POA: Insufficient documentation

## 2021-12-22 DIAGNOSIS — Z720 Tobacco use: Secondary | ICD-10-CM | POA: Diagnosis not present

## 2021-12-22 DIAGNOSIS — I2510 Atherosclerotic heart disease of native coronary artery without angina pectoris: Secondary | ICD-10-CM | POA: Diagnosis not present

## 2021-12-22 DIAGNOSIS — N1832 Chronic kidney disease, stage 3b: Secondary | ICD-10-CM

## 2021-12-22 MED ORDER — LOSARTAN POTASSIUM 50 MG PO TABS: 50.0000 mg | ORAL_TABLET | Freq: Every day | ORAL | 6 refills | Status: DC

## 2021-12-22 NOTE — Patient Instructions (Signed)
Medication Changes: ? ?Increase Losartan to 50 mg Daily ? ?Lab Work: ? ?none ? ?Testing/Procedures: ? ?none ? ?Referrals: ? ?none ? ?Special Instructions // Education: ? ?Do the following things EVERYDAY: ?Weigh yourself in the morning before breakfast. Write it down and keep it in a log. ?Take your medicines as prescribed ?Eat low salt foods--Limit salt (sodium) to 2000 mg per day.  ?Stay as active as you can everyday ?Limit all fluids for the day to less than 2 liters ? ? ?Follow-Up in: 6 months (November), **PLEASE CALL OUR OFFICE IN September TO SCHEDULE THIS APPOINTMENT ? ?At the [facility], you and your health needs are our priority. We have a designated team specialized in the treatment of Heart Failure. This Care Team includes your primary Heart Failure Specialized Cardiologist (physician), Advanced Practice Providers (APPs- Physician Assistants and Nurse Practitioners), and Pharmacist who all work together to provide you with the care you need, when you need it.  ? ?You may see any of the following providers on your designated Care Team at your next follow up: ? ?Dr [name] ?Dr Loralie Champagne ?[name], NP ?Lyda Jester, PA ?Jessica Milford,NP ?Marlyce Huge, PA ?Audry Riles, PharmD ? ? ?Please be sure to bring in all your medications bottles to every appointment.  ? ?Need to Contact us: ? ?If you have any questions or concerns before your next appointment please send [place] a message through [place] or call our office at [phone].   ? ?TO LEAVE A MESSAGE FOR THE NURSE SELECT OPTION 2, PLEASE LEAVE A MESSAGE INCLUDING: ?YOUR NAME ?DATE OF BIRTH ?CALL BACK NUMBER ?REASON FOR CALL**this is important as we prioritize the call backs ? ?YOU WILL RECEIVE A CALL BACK THE SAME DAY AS LONG AS YOU CALL BEFORE 4:00 PM ? ? ?

## 2021-12-22 NOTE — Progress Notes (Signed)
? ? ?                     Advanced Heart Failure Clinic Note ? ?PCP: Dr Lysle Rubens  ?Primary Cardiologist: [name]  ?EP: Dr Lovena Le ?HF MD: Dr Haroldine Laws  ? ?HPI: ?[name] [name] is a 76 y.o. female with h/o HTN, former tobacco abuse, permanent AF, CAD s/p CABG x 2 (LIMA to LAD and SVG to OM) in 2016, Chronic combined HF, Ischemic CM, HLD, and Biotronik PPM.  ? ?She was initially noted to be in atrial fibrillation in 2016 with a reduced LVEF, leading to ischemic workup with a cardiac cath showing severe multivessel CAD with LM dz -> CABG x2 utilizing LIMA to LAD, SVG to OM. ?  ?Admitted 1/19 for AF with RVR in setting of Influenza A. EP was consulted to discuss plans for Tikosyn vs. ablation vs. medication rate control however, due to her known medication noncompliance she was found to not be an ablation nor antiarrhythmic candidate ?  ?2019 back in Afib. She was started on amiodarone & Entresto.  She underwent successful atrial flutter TEE/DCCV on 12/25/17.  Later went back in A fib. ?  ?Intolerant Entresto due to chest pain.  ?  ?Admitted 3/21 for iron-def anemia. Hgb 5.4 EGD/colonoscopy which were unremarkable and capsule endoscopy which showed duodenal ulcer with bleeding and stigmata.  GI recommended oral Protonix and outpatient follow-up. ? ?Admitted 6/22 for AV node ablation and PPM due to uncontrolled heart rates with A fib. Underwent Biotronix single lead PPM. Toprol XL and digoxin stopped. Carvedilol 25 mg bid started. She was discharged 01/19/21.  ?  ?Admitted 0/63/01 with a/c systolic heart failure. Diuresed with IV lasix and transitioned to torsemide 40 mg daily. EF 25-30% with severe MR/TR.   ? ?Admitted 10/22 for COPD exacerbation with a component of A/C CHF, ran out of torsemide several days prior ?Echo 07/30/21: EF 25-30% Moderate RV dysfunction. Severe biatrial enlargement. Severe central MR/TR.  ? ?Was seen at [facility] in 4/23 for second opinion. Restarted Entresto 24/26 bid. Did not tolerate due to CP.  Agreed she might candidate for mTEER down the road.  ? ?Here for f/u. Says she is doing ok but feels SOB with walking. No edema, orthopnea or PND. Occasional fleeting CPs. No dizziness. Stop smoking last month. ? ? ?Cardiac Studies   ?Echo 3/16 EF 35-40%. ?Echo 10/17 EF improved to 50-55%. ?Echo 5/18 EF stable 50-55% ?Echo 3/19 LVEF 30-35%, Moderate LVH, Mild AI, Moderate regurgitation, Severe LAE, Moderately reduced RV, Severe RAE, PA peak pressure 50 mm Hg.   (Decreased from Echo 01/11/2017 with LVEF 50-55%) ?Echo 12/19 Ef 30-35% RV mildly dilated. ?Echo 4/21 EF 55-60%  ?Echo 11/21 EF 30-35% RV normal  ?Echo 4/22 EF 25-30% RV moderately reduced  ?Echo 6/22 EF 25-30% RV severely reduced ? ?Stress Test 1/20- Low Risk EF 44% ? ?ROS: All systems negative except as listed in HPI, PMH and Problem List. ? ?SH:  ?Social History  ? ?Socioeconomic History  ? Marital status: Widowed  ?  Spouse name: Not on file  ? Number of children: 1  ? Years of education: 47  ? Highest education level: Not on file  ?Occupational History  ? Occupation: RETIRED [employer]  ?Tobacco Use  ? Smoking status: Former  ?  Packs/day: 0.50  ?  Years: 56.00  ?  Pack years: 28.00  ?  Types: Cigarettes  ?  Quit date: 11/13/2021  ?  Years  since quitting: 0.1  ? Smokeless tobacco: Never  ? Tobacco comments:  ?  smoking 2 cigarettes/day as of 10/23/20  ?Vaping Use  ? Vaping Use: Never used  ?Substance and Sexual Activity  ? Alcohol use: No  ?  Alcohol/week: 0.0 standard drinks  ? Drug use: No  ? Sexual activity: Not on file  ?Other Topics Concern  ? Not on file  ?Social History Narrative  ? Patient reports it being difficult to pay for everything due to outstanding medical bills from hospital stay last year but states she is able to keep up with basic expenses.  Patient does have some concerns with her house's condition due to a water leak- had her roof repaired last month but has some leaking in the front which has affected her porch.  Patient owns  her own car and is able to drive herself but has some concerns about the reliability of her car- has gotten taxi to the clinic for appointment in the past when her car broke down.  ? ?Social Determinants of Health  ? ?Financial Resource Strain: Not on file  ?Food Insecurity: Not on file  ?Transportation Needs: Not on file  ?Physical Activity: Not on file  ?Stress: Not on file  ?Social Connections: Not on file  ?Intimate Partner Violence: Not on file  ? ?FH:  ?Family History  ?Problem Relation Age of Onset  ? Cancer Mother   ?     LYMPHOMA  ? Heart disease Father   ? Other Father   ?     TB  ? CVA Sister   ? Prostate cancer Brother 63  ? Diabetes Brother   ? ?Past Medical History:  ?Diagnosis Date  ? ([place]impEF) heart failure with improved ejection fraction ([place])   ? a. 07/2018 Echo: EF 30-35%; b. 11/2019 Echo: EF 55-60%, no rwma, Gr2 DD, Nl RV size/fxn. Mild BAE. Mild MR/AI.  ? Arthritis   ? Asthma   ? Atopic dermatitis   ? CAD (coronary artery disease)   ? a. 2016 s/p CABG x 2 (LIMA->LAD, VG->OM); b. 08/2018 MV: EF 44%, no ischemia/infact.  ? Cardiac arrest ([place]) 10/2014  ? Cardiomyopathy, ischemic   ? a. 07/2018 Echo: EF 30-35%; 11/2019 Echo: EF 55-60%.  ? Carotid arterial disease ([place])   ? a. 11/2019 Carotid U/S  ? CHF (congestive heart failure) ([place])   ? CKD (chronic kidney disease), stage III ([place])   ? COPD (chronic obstructive pulmonary disease) ([place])   ? Diabetes mellitus without complication ([place])   ? Dysrhythmia   ? Esophageal dilatation 2013  ? GERD (gastroesophageal reflux disease)   ? Gout   ? Headache   ? Hypertension   ? Hypokalemia   ? Idiopathic angioedema   ? LGI bleed 08/06/2017  ? a. felt to be hemorrhoidal during that admission (no drop in Hgb).  ? Lower back pain   ? Paroxysmal atrial fibrillation (HCC)   ? a. Dx 2016-->h/o difficult to control rates (complicated by noncompliance), not felt to be a candidate for ablation or antiarrhythmic due to noncompliance; b. Recurrent AF 2021 - converted w/ IV  dilt; c. CHA2DS2VASc = 7-->Xarelto.  ? Personal history of noncompliance with medical treatment, presenting hazards to health   ? Prediabetes   ? Presence of permanent cardiac pacemaker   ? S/P CABG x 2 with clipping of LA appendage 11/14/2014  ? LIMA to LAD, SVG to OM, EVH via right thigh  ? Urine incontinence   ? Uterine fibroid   ? ?  Current Outpatient Medications  ?Medication Sig Dispense Refill  ? acetaminophen (TYLENOL) 325 MG tablet Take 2 tablets (650 mg total) by mouth every 4 (four) hours as needed for headache or mild pain.    ? allopurinol (ZYLOPRIM) 100 MG tablet Take 100 mg by mouth daily.    ? atorvastatin (LIPITOR) 10 MG tablet Take 10 mg by mouth at bedtime.    ? losartan (COZAAR) 25 MG tablet Take 25 mg by mouth daily.    ? meclizine (ANTIVERT) 12.5 MG tablet Take 1 tablet (12.5 mg total) by mouth every 6 (six) hours as needed for dizziness. 30 tablet 0  ? metFORMIN (GLUCOPHAGE) 500 MG tablet Take 500 mg by mouth daily with breakfast. Daily with a meal    ? metoprolol succinate (TOPROL-XL) 100 MG 24 hr tablet Take 1 tablet (100 mg total) by mouth daily. Take with or immediately following a meal. 90 tablet 3  ? pantoprazole (PROTONIX) 40 MG tablet Take 40 mg by mouth daily.    ? Prednisol Ace-Moxiflox-Bromfen 1-0.5-0.075 % SUSP Place 1 drop into the right eye 4 (four) times daily.    ? rivaroxaban (XARELTO) 20 MG TABS tablet Take 1 tablet (20 mg total) by mouth daily with supper. Resume from 11/01/2019    ? spironolactone (ALDACTONE) 25 MG tablet Take 1 tablet (25 mg total) by mouth daily. 30 tablet 11  ? Tiotropium Bromide Monohydrate (SPIRIVA RESPIMAT) 2.5 MCG/ACT AERS Inhale 2 puffs into the lungs daily. (Patient taking differently: Inhale 2 puffs into the lungs as needed.) 1 g 5  ? torsemide (DEMADEX) 20 MG tablet Take 20 mg by mouth daily.    ? ?No current facility-administered medications for this encounter.  ? ?BP 140/80   Pulse 93   Wt 88.9 kg (196 lb)   SpO2 (!) 68%   BMI 30.70 kg/m?   ? ?Wt Readings from Last 3 Encounters:  ?12/22/21 88.9 kg (196 lb)  ?07/30/21 87.8 kg (193 lb 9.6 oz)  ?06/17/21 86.5 kg (190 lb 12.8 oz)  ? ?PHYSICAL EXAM: ?General:  Well appearing. No resp difficulty ?HEENT:

## 2021-12-23 ENCOUNTER — Encounter (HOSPITAL_COMMUNITY): Payer: Self-pay | Admitting: *Deleted

## 2021-12-23 ENCOUNTER — Emergency Department (HOSPITAL_COMMUNITY): Payer: HMO

## 2021-12-23 ENCOUNTER — Emergency Department (HOSPITAL_COMMUNITY)
Admission: EM | Admit: 2021-12-23 | Discharge: 2021-12-23 | Disposition: A | Discharge status: Home / Self Care | Payer: HMO | Attending: Emergency Medicine | Admitting: Emergency Medicine

## 2021-12-23 ENCOUNTER — Other Ambulatory Visit: Payer: Self-pay

## 2021-12-23 DIAGNOSIS — N189 Chronic kidney disease, unspecified: Secondary | ICD-10-CM | POA: Insufficient documentation

## 2021-12-23 DIAGNOSIS — I130 Hypertensive heart and chronic kidney disease with heart failure and stage 1 through stage 4 chronic kidney disease, or unspecified chronic kidney disease: Secondary | ICD-10-CM | POA: Diagnosis not present

## 2021-12-23 DIAGNOSIS — R1033 Periumbilical pain: Secondary | ICD-10-CM | POA: Diagnosis not present

## 2021-12-23 DIAGNOSIS — J449 Chronic obstructive pulmonary disease, unspecified: Secondary | ICD-10-CM | POA: Diagnosis not present

## 2021-12-23 DIAGNOSIS — R1900 Intra-abdominal and pelvic swelling, mass and lump, unspecified site: Secondary | ICD-10-CM | POA: Insufficient documentation

## 2021-12-23 DIAGNOSIS — Z951 Presence of aortocoronary bypass graft: Secondary | ICD-10-CM | POA: Diagnosis not present

## 2021-12-23 DIAGNOSIS — R109 Unspecified abdominal pain: Secondary | ICD-10-CM | POA: Diagnosis not present

## 2021-12-23 DIAGNOSIS — Z720 Tobacco use: Secondary | ICD-10-CM | POA: Diagnosis not present

## 2021-12-23 DIAGNOSIS — I110 Hypertensive heart disease with heart failure: Secondary | ICD-10-CM | POA: Diagnosis not present

## 2021-12-23 DIAGNOSIS — R1030 Lower abdominal pain, unspecified: Secondary | ICD-10-CM | POA: Diagnosis not present

## 2021-12-23 DIAGNOSIS — Z7984 Long term (current) use of oral hypoglycemic drugs: Secondary | ICD-10-CM | POA: Insufficient documentation

## 2021-12-23 DIAGNOSIS — I739 Peripheral vascular disease, unspecified: Secondary | ICD-10-CM

## 2021-12-23 DIAGNOSIS — E1122 Type 2 diabetes mellitus with diabetic chronic kidney disease: Secondary | ICD-10-CM | POA: Diagnosis not present

## 2021-12-23 DIAGNOSIS — I509 Heart failure, unspecified: Secondary | ICD-10-CM | POA: Insufficient documentation

## 2021-12-23 DIAGNOSIS — I70209 Unspecified atherosclerosis of native arteries of extremities, unspecified extremity: Secondary | ICD-10-CM | POA: Diagnosis not present

## 2021-12-23 DIAGNOSIS — Z79899 Other long term (current) drug therapy: Secondary | ICD-10-CM | POA: Insufficient documentation

## 2021-12-23 LAB — CBC WITH DIFFERENTIAL/PLATELET
Abs Immature Granulocytes: 0.03 10*3/uL (ref 0.00–0.07)
Basophils Absolute: 0 10*3/uL (ref 0.0–0.1)
Basophils Relative: 1 %
Eosinophils Absolute: 0.1 10*3/uL (ref 0.0–0.5)
Eosinophils Relative: 1 %
HCT: 45.7 % (ref 36.0–46.0)
Hemoglobin: 15.3 g/dL — ABNORMAL HIGH (ref 12.0–15.0)
Immature Granulocytes: 0 %
Lymphocytes Relative: 24 %
Lymphs Abs: 2 10*3/uL (ref 0.7–4.0)
MCH: 31.3 pg (ref 26.0–34.0)
MCHC: 33.5 g/dL (ref 30.0–36.0)
MCV: 93.5 fL (ref 80.0–100.0)
Monocytes Absolute: 0.7 10*3/uL (ref 0.1–1.0)
Monocytes Relative: 8 %
Neutro Abs: 5.6 10*3/uL (ref 1.7–7.7)
Neutrophils Relative %: 66 %
Platelets: 118 10*3/uL — ABNORMAL LOW (ref 150–400)
RBC: 4.89 MIL/uL (ref 3.87–5.11)
RDW: 15.6 % — ABNORMAL HIGH (ref 11.5–15.5)
WBC: 8.5 10*3/uL (ref 4.0–10.5)
nRBC: 0 % (ref 0.0–0.2)

## 2021-12-23 LAB — URINALYSIS, ROUTINE W REFLEX MICROSCOPIC
Bilirubin Urine: NEGATIVE
Glucose, UA: NEGATIVE mg/dL
Ketones, ur: NEGATIVE mg/dL
Leukocytes,Ua: NEGATIVE
Nitrite: NEGATIVE
Protein, ur: 30 mg/dL — AB
Specific Gravity, Urine: 1.016 (ref 1.005–1.030)
pH: 5 (ref 5.0–8.0)

## 2021-12-23 LAB — COMPREHENSIVE METABOLIC PANEL
ALT: 22 U/L (ref 0–44)
AST: 23 U/L (ref 15–41)
Albumin: 3.4 g/dL — ABNORMAL LOW (ref 3.5–5.0)
Alkaline Phosphatase: 118 U/L (ref 38–126)
Anion gap: 8 (ref 5–15)
BUN: 22 mg/dL (ref 8–23)
CO2: 20 mmol/L — ABNORMAL LOW (ref 22–32)
Calcium: 9.2 mg/dL (ref 8.9–10.3)
Chloride: 114 mmol/L — ABNORMAL HIGH (ref 98–111)
Creatinine, Ser: 1.33 mg/dL — ABNORMAL HIGH (ref 0.44–1.00)
GFR, Estimated: 41 mL/min — ABNORMAL LOW (ref >60)
Glucose, Bld: 104 mg/dL — ABNORMAL HIGH (ref 70–99)
Potassium: 4.1 mmol/L (ref 3.5–5.1)
Sodium: 142 mmol/L (ref 135–145)
Total Bilirubin: 1.4 mg/dL — ABNORMAL HIGH (ref 0.3–1.2)
Total Protein: 6.8 g/dL (ref 6.5–8.1)

## 2021-12-23 LAB — LIPASE, BLOOD: Lipase: 22 U/L (ref 11–51)

## 2021-12-23 MED ORDER — ACETAMINOPHEN 500 MG PO TABS: 1000.0000 mg | ORAL_TABLET | Freq: Once | ORAL | Status: DC

## 2021-12-23 NOTE — ED Provider Notes (Signed)
?[facility] ?Provider Note ? ? ?CSN: [id number] ?Arrival date & time: 12/23/21  1408 ? ?  ? ?History ? ?Chief Complaint  ?Patient presents with  ? Abdominal Pain  ? ? ?[name] [name] is a 76 y.o. female. ? ?Patient is a 76 year old female with a history of hypertension, CABG x2, cardiomyopathy with an EF of 25-30% status post defibrillator, lower GI bleeding, CKD, COPD, diabetes and CHF who is presenting today with complaints of abdominal pain.  She reports the pain started 3 days ago.  It has been a waxing and waning pain in the lower part of her abdomen which has caused her to have some soft stools but denies any nausea, vomiting, diarrhea.  She has not had known fever or urinary complaints.  She denies having symptoms similar to this in the past.  No prior abdominal surgeries. ? ?The history is provided by the patient.  ?Abdominal Pain ? ?  ? ?Home Medications ?Prior to Admission medications   ?Medication Sig Start Date End Date Taking? Authorizing Provider  ?acetaminophen (TYLENOL) 325 MG tablet Take 2 tablets (650 mg total) by mouth every 4 (four) hours as needed for headache or mild pain. 12/09/20   Cristal Ford, DO  ?allopurinol (ZYLOPRIM) 100 MG tablet Take 100 mg by mouth daily. 07/19/21   [provider]  ?atorvastatin (LIPITOR) 10 MG tablet Take 10 mg by mouth at bedtime. 01/22/21   [provider]  ?losartan (COZAAR) 50 MG tablet Take 1 tablet (50 mg total) by mouth daily. 12/22/21   Bensimhon, Shaune Pascal, MD  ?meclizine (ANTIVERT) 12.5 MG tablet Take 1 tablet (12.5 mg total) by mouth every 6 (six) hours as needed for dizziness. 05/18/21   Rafael Bihari, FNP  ?metFORMIN (GLUCOPHAGE) 500 MG tablet Take 500 mg by mouth daily with breakfast. Daily with a meal    [provider]  ?metoprolol succinate (TOPROL-XL) 100 MG 24 hr tablet Take 1 tablet (100 mg total) by mouth daily. Take with or immediately following a meal. 04/30/21 07/30/22   Evans Lance, MD  ?pantoprazole (PROTONIX) 40 MG tablet Take 40 mg by mouth daily.    [provider]  ?Prednisol Ace-Moxiflox-Bromfen 1-0.5-0.075 % SUSP Place 1 drop into the right eye 4 (four) times daily. 05/04/21   [provider]  ?rivaroxaban (XARELTO) 20 MG TABS tablet Take 1 tablet (20 mg total) by mouth daily with supper. Resume from 11/01/2019 11/01/19   Aline August, MD  ?spironolactone (ALDACTONE) 25 MG tablet Take 1 tablet (25 mg total) by mouth daily. 06/21/21   Rafael Bihari, FNP  ?Tiotropium Bromide Monohydrate (SPIRIVA RESPIMAT) 2.5 MCG/ACT AERS Inhale 2 puffs into the lungs daily. ?Patient taking differently: Inhale 2 puffs into the lungs as needed. 10/23/20   Parrett, Fonnie Mu, NP  ?torsemide (DEMADEX) 20 MG tablet Take 20 mg by mouth daily.    [provider]  ?   ? ?Allergies    ?Bee venom, Ivp dye [iodinated contrast media], Ace inhibitors, Atenolol, Codeine, Entresto [sacubitril-valsartan], Penicillin g, Shrimp [shellfish allergy], Simvastatin, and Jardiance [empagliflozin]   ? ?Review of Systems   ?Review of Systems  ?Gastrointestinal:  Positive for abdominal pain.  ? ?Physical Exam ?Updated Vital Signs ?BP (!) 168/101   Pulse 71   Temp 98.4 ?F (36.9 ?C) (Oral)   Resp (!) 30   SpO2 97%  ?Physical Exam ?Vitals and nursing note reviewed.  ?Constitutional:   ?   General: She is not  in acute distress. ?   Appearance: She is well-developed.  ?HENT:  ?   Head: Normocephalic and atraumatic.  ?   Mouth/Throat:  ?   Mouth: Mucous membranes are moist.  ?Eyes:  ?   Pupils: Pupils are equal, round, and reactive to light.  ?Cardiovascular:  ?   Rate and Rhythm: Normal rate and regular rhythm.  ?   Heart sounds: Normal heart sounds. No murmur heard. ?  No friction rub.  ?Pulmonary:  ?   Effort: Pulmonary effort is normal.  ?   Breath sounds: Normal breath sounds. No wheezing or rales.  ?Abdominal:  ?   General: Bowel sounds are normal. There is no distension.  ?    Palpations: Abdomen is soft.  ?   Tenderness: There is abdominal tenderness in the periumbilical area. There is no right CVA tenderness, left CVA tenderness, guarding or rebound.  ?Musculoskeletal:     ?   General: No tenderness. Normal range of motion.  ?   Right lower leg: No edema.  ?   Left lower leg: No edema.  ?   Comments: No edema.  Feet are cool to the touch but palpable faint PT pulses bilaterally  ?Skin: ?   General: Skin is warm and dry.  ?   Findings: No rash.  ?Neurological:  ?   Mental Status: She is alert and oriented to person, place, and time.  ?   Cranial Nerves: No cranial nerve deficit.  ?Psychiatric:     ?   Behavior: Behavior normal.  ? ? ?ED Results / Procedures / Treatments   ?Labs ?(all labs ordered are listed, but only abnormal results are displayed) ?Labs Reviewed  ?CBC WITH DIFFERENTIAL/PLATELET - Abnormal; Notable for the following components:  ?    Result Value  ? Hemoglobin 15.3 (*)   ? RDW 15.6 (*)   ? Platelets 118 (*)   ? All other components within normal limits  ?COMPREHENSIVE METABOLIC PANEL - Abnormal; Notable for the following components:  ? Chloride 114 (*)   ? CO2 20 (*)   ? Glucose, Bld 104 (*)   ? Creatinine, Ser 1.33 (*)   ? Albumin 3.4 (*)   ? Total Bilirubin 1.4 (*)   ? GFR, Estimated 41 (*)   ? All other components within normal limits  ?URINALYSIS, ROUTINE W REFLEX MICROSCOPIC - Abnormal; Notable for the following components:  ? APPearance HAZY (*)   ? Hgb urine dipstick SMALL (*)   ? Protein, ur 30 (*)   ? Bacteria, UA FEW (*)   ? All other components within normal limits  ?LIPASE, BLOOD  ? ? ?EKG ?None ? ?Radiology ?CT ABDOMEN PELVIS WO CONTRAST ? ?Result Date: 12/23/2021 ?CLINICAL DATA:  Left lower quadrant abdominal pain EXAM: CT ABDOMEN AND PELVIS WITHOUT CONTRAST TECHNIQUE: Multidetector CT imaging of the abdomen and pelvis was performed following the standard protocol without IV contrast. RADIATION DOSE REDUCTION: This exam was performed according to the  departmental dose-optimization program which includes automated exposure control, adjustment of the mA and/or kV according to patient size and/or use of iterative reconstruction technique. COMPARISON:  02/10/2020 FINDINGS: Lower chest: Visualized lung bases are clear. Mild cardiomegaly. Defibrillator lead is seen within the right ventricle, new since prior examination. Small hiatal hernia. Hepatobiliary: No focal liver abnormality is seen. No gallstones, gallbladder wall thickening, or biliary dilatation. Pancreas: Unremarkable Spleen: Unremarkable Adrenals/Urinary Tract: Adrenal glands are unremarkable. Kidneys are normal, without renal calculi, focal lesion, or hydronephrosis. Bladder is  unremarkable. Stomach/Bowel: Mild sigmoid colonic diverticulosis. Stomach, small bowel, and large bowel are otherwise unremarkable. Appendix normal. No free intraperitoneal gas or fluid. Vascular/Lymphatic: Extensive aortoiliac atherosclerotic calcification. Prominent visceral calcification within the proximal superior mesenteric artery again noted, however, the degree of stenosis is not well assessed on this non arteriographic study. Extensive atherosclerotic calcification noted within the lower extremity arterial inflow with suspected segmental chronic thrombosis of the left common iliac artery proximally. No aortic aneurysm. No pathologic adenopathy within the abdomen and pelvis. Reproductive: Fibroid uterus containing multiple involuted, densely calcified fibroids. Soft tissue mass within the right adnexa appears stable measuring 3.8 x 6.7 cm when measured in similar fashion,, not well characterized on this examination. This was, however, previously assessed on MRI examination of 09/19/2007 and may represent an ovarian fibrothecoma or potentially a broad ligament fibroid. This demonstrates progressive interval increase in size since that examination on a multiple intervening sonogram and CT examinations. Left adnexa is  unremarkable. Other: No abdominal wall hernia. Musculoskeletal: No acute bone abnormality. No lytic or blastic bone lesion. IMPRESSION: No acute intra-abdominal pathology identified. No definite radiographic explanation for the pat

## 2021-12-23 NOTE — ED Provider Triage Note (Signed)
Emergency Medicine Provider Triage Evaluation Note ? ?[name] [name] , a 76 y.o. female  was evaluated in triage.  Pt complains of generalized abdominal pain that has been intermittent over the past 3 days.  Without associated nausea, vomiting, diarrhea, dysuria.  She denies fever, chills. ? ?Review of Systems  ?Positive: As above ?Negative: As above ? ?Physical Exam  ?BP (!) 157/97 (BP Location: Right Arm)   Pulse 70   Temp 98.4 ?F (36.9 ?C) (Oral)   Resp 16   SpO2 97%  ?Gen:   Awake, no distress   ?Resp:  Normal effort  ?MSK:   Moves extremities without difficulty  ?Other:  Abdomen nondistended and nontender.  Without flank tenderness. ? ?Medical Decision Making  ?Medically screening exam initiated at 2:26 PM.  Appropriate orders placed.  [name] [name] was informed that the remainder of the evaluation will be completed by another provider, this initial triage assessment does not replace that evaluation, and the importance of remaining in the ED until their evaluation is complete. ? ? ?  ?Evlyn Courier, PA-C ?12/23/21 1427 ? ?

## 2021-12-23 NOTE — ED Triage Notes (Signed)
Pt reports several days of generalized abd pain, became worse after eating this morning and radiated to left side. Denies n/v/d. Last bm was this morning and normal.  ?

## 2021-12-23 NOTE — Progress Notes (Signed)
? ? ?Electrophysiology Office Note ?Date: 12/24/2021 ? ?ID:  [name] [name], DOB [birth year], MRN [id number] ? ?PCP: [name], MD ?Primary Cardiologist: [name], MD ?Electrophysiologist: [name], MD  ? ?CC: Pacemaker follow-up ? ?[name] [name] is a 76 y.o. female seen today for [name], MD for routine electrophysiology followup.  Since last being seen in our clinic the patient reports doing OK. She was seen in ED last night for GI upset. Frequent bowel movements, not diarrhea. Work up with no confirming diagnosis, but CT did show extensive atherosclerosis within the superior mesenteric artery raising concern for chronic mesenteric ischemia. .  she denies chest pain, palpitations, dyspnea, PND, orthopnea, nausea, vomiting, dizziness, syncope, edema, weight gain, or early satiety. ? ?Device History: ?Biotronik Single Chamber PPM implanted 01/2021 for uncontrolled atrial arryhtmia s/p  ? ?Past Medical History:  ?Diagnosis Date  ? (HFimpEF) heart failure with improved ejection fraction ([place])   ? a. 07/2018 Echo: EF 30-35%; b. 11/2019 Echo: EF 55-60%, no rwma, Gr2 DD, Nl RV size/fxn. Mild BAE. Mild MR/AI.  ? Arthritis   ? Asthma   ? Atopic dermatitis   ? CAD (coronary artery disease)   ? a. 2016 s/p CABG x 2 (LIMA->LAD, VG->OM); b. 08/2018 MV: EF 44%, no ischemia/infact.  ? Cardiac arrest ([place]) 10/2014  ? Cardiomyopathy, ischemic   ? a. 07/2018 Echo: EF 30-35%; 11/2019 Echo: EF 55-60%.  ? Carotid arterial disease ([place])   ? a. 11/2019 Carotid U/S  ? CHF (congestive heart failure) ([place])   ? CKD (chronic kidney disease), stage III ([place])   ? COPD (chronic obstructive pulmonary disease) ([place])   ? Diabetes mellitus without complication ([facility])   ? Dysrhythmia   ? Esophageal dilatation 2013  ? GERD (gastroesophageal reflux disease)   ? Gout   ? Headache   ? Hypertension   ? Hypokalemia   ? Idiopathic angioedema   ? LGI bleed 08/06/2017  ? a. felt to be hemorrhoidal during that admission (no drop in Hgb).  ? Lower back  pain   ? Paroxysmal atrial fibrillation (HCC)   ? a. Dx 2016-->h/o difficult to control rates (complicated by noncompliance), not felt to be a candidate for ablation or antiarrhythmic due to noncompliance; b. Recurrent AF 2021 - converted w/ IV dilt; c. CHA2DS2VASc = 7-->Xarelto.  ? Personal history of noncompliance with medical treatment, presenting hazards to health   ? Prediabetes   ? Presence of permanent cardiac pacemaker   ? S/P CABG x 2 with clipping of LA appendage 11/14/2014  ? LIMA to LAD, SVG to OM, EVH via right thigh  ? Urine incontinence   ? Uterine fibroid   ? ?Past Surgical History:  ?Procedure Laterality Date  ? AV NODE ABLATION N/A 01/18/2021  ? Procedure: AV NODE ABLATION;  Surgeon: Evans Lance, MD;  Location: [facility];  Service: Cardiovascular;  Laterality: N/A;  ? BALLOON DILATION N/A 10/10/2019  ? Procedure: BALLOON DILATION;  Surgeon: Otis Brace, MD;  Location: [facility] ENDOSCOPY;  Service: Gastroenterology;  Laterality: N/A;  ? BIOPSY  10/10/2019  ? Procedure: BIOPSY;  Surgeon: Otis Brace, MD;  Location: [facility];  Service: Gastroenterology;;  ? BIOPSY  10/11/2019  ? Procedure: BIOPSY;  Surgeon: Otis Brace, MD;  Location: [facility];  Service: Gastroenterology;;  ? CARDIOVERSION N/A 11/18/2014  ? Procedure: CARDIOVERSION;  Surgeon: [name], MD;  Location: [facility];  Service: Cardiovascular;  Laterality: N/A;  ? CARDIOVERSION N/A 12/25/2017  ? Procedure: CARDIOVERSION;  Surgeon: [name], MD;  Location: [facility]COPY;  Service: Cardiovascular;  Laterality: N/A;  ? CLIPPING OF ATRIAL APPENDAGE N/A 11/14/2014  ? Procedure: CLIPPING OF ATRIAL APPENDAGE;  Surgeon: [name], MD;  Location: [facility];  Service: Open Heart Surgery;  Laterality: N/A;  ? COLONOSCOPY  2013  ? COLONOSCOPY WITH PROPOFOL N/A 10/11/2019  ? Procedure: COLONOSCOPY WITH PROPOFOL;  Surgeon: Otis Brace, MD;  Location: [facility];  Service: Gastroenterology;   Laterality: N/A;  ? CORONARY ARTERY BYPASS GRAFT N/A 11/14/2014  ? Procedure: CORONARY ARTERY BYPASS GRAFTING (CABG)TIMES 2 USING LEFT INTERNAL MAMMARY ARTERY AND RIGHT SAPHENOUS VEIN HARVESTED ENDOSCOPICALLY;  Surgeon: [name], MD;  Location: [facility];  Service: Open Heart Surgery;  Laterality: N/A;  ? ESOPHAGOGASTRODUODENOSCOPY (EGD) WITH PROPOFOL N/A 10/10/2019  ? Procedure: ESOPHAGOGASTRODUODENOSCOPY (EGD) WITH PROPOFOL;  Surgeon: Otis Brace, MD;  Location: [facility] ENDOSCOPY;  Service: Gastroenterology;  Laterality: N/A;  ? ESOPHAGOGASTRODUODENOSCOPY (EGD) WITH PROPOFOL N/A 10/27/2019  ? Procedure: ESOPHAGOGASTRODUODENOSCOPY (EGD) WITH PROPOFOL;  Surgeon: Ronnette Juniper, MD;  Location: [facility];  Service: Gastroenterology;  Laterality: N/A;  ? GIVENS CAPSULE STUDY N/A 10/27/2019  ? Procedure: GIVENS CAPSULE STUDY;  Surgeon: Ronnette Juniper, MD;  Location: [facility];  Service: Gastroenterology;  Laterality: N/A;  ? INSERT / REPLACE / REMOVE PACEMAKER    ? LEFT HEART CATHETERIZATION WITH CORONARY ANGIOGRAM N/A 11/04/2014  ? Procedure: LEFT HEART CATHETERIZATION WITH CORONARY ANGIOGRAM;  Surgeon: Troy Sine, MD;  Location: [facility] CATH LAB;  Service: Cardiovascular;  Laterality: N/A;  ? PACEMAKER IMPLANT N/A 01/18/2021  ? Procedure: PACEMAKER IMPLANT;  Surgeon: Evans Lance, MD;  Location: [facility] CV LAB;  Service: Cardiovascular;  Laterality: N/A;  ? PACEMAKER IMPLANT Left   ? POLYPECTOMY  10/11/2019  ? Procedure: POLYPECTOMY;  Surgeon: Otis Brace, MD;  Location: [facility] ENDOSCOPY;  Service: Gastroenterology;;  ? TEE WITHOUT CARDIOVERSION N/A 11/14/2014  ? Procedure: TRANSESOPHAGEAL ECHOCARDIOGRAM (TEE);  Surgeon: [name], MD;  Location: [facility];  Service: Open Heart Surgery;  Laterality: N/A;  ? TEE WITHOUT CARDIOVERSION N/A 12/25/2017  ? Procedure: TRANSESOPHAGEAL ECHOCARDIOGRAM (TEE);  Surgeon: [name], MD;  Location: [facility];  Service: Cardiovascular;  Laterality: N/A;  ?  TEMPORARY PACEMAKER INSERTION  11/04/2014  ? Procedure: TEMPORARY PACEMAKER INSERTION;  Surgeon: Troy Sine, MD;  Location: [facility] CATH LAB;  Service: Cardiovascular;;  ? TOOTH EXTRACTION    ? ? ?Current Outpatient Medications  ?Medication Sig Dispense Refill  ? acetaminophen (TYLENOL) 325 MG tablet Take 2 tablets (650 mg total) by mouth every 4 (four) hours as needed for headache or mild pain.    ? allopurinol (ZYLOPRIM) 100 MG tablet Take 100 mg by mouth daily.    ? atorvastatin (LIPITOR) 10 MG tablet Take 10 mg by mouth at bedtime.    ? losartan (COZAAR) 50 MG tablet Take 1 tablet (50 mg total) by mouth daily. 30 tablet 6  ? meclizine (ANTIVERT) 12.5 MG tablet Take 1 tablet (12.5 mg total) by mouth every 6 (six) hours as needed for dizziness. 30 tablet 0  ? metFORMIN (GLUCOPHAGE) 500 MG tablet Take 500 mg by mouth daily with breakfast. Daily with a meal    ? metoprolol succinate (TOPROL-XL) 100 MG 24 hr tablet Take 1 tablet (100 mg total) by mouth daily. Take with or immediately following a meal. 90 tablet 3  ? pantoprazole (PROTONIX) 40 MG tablet Take 40 mg by mouth daily.    ? Prednisol Ace-Moxiflox-Bromfen 1-0.5-0.075 % SUSP Place  1 drop into the right eye 4 (four) times daily.    ? rivaroxaban (XARELTO) 20 MG TABS tablet Take 1 tablet (20 mg total) by mouth daily with supper. Resume from 11/01/2019    ? spironolactone (ALDACTONE) 25 MG tablet Take 1 tablet (25 mg total) by mouth daily. 30 tablet 11  ? Tiotropium Bromide Monohydrate (SPIRIVA RESPIMAT) 2.5 MCG/ACT AERS Inhale 2 puffs into the lungs daily. (Patient taking differently: Inhale 2 puffs into the lungs as needed.) 1 g 5  ? torsemide (DEMADEX) 20 MG tablet Take 20 mg by mouth daily.    ? ?No current facility-administered medications for this visit.  ? ? ?Allergies:   Bee venom, Ivp dye [iodinated contrast media], Ace inhibitors, Atenolol, Codeine, Entresto [sacubitril-valsartan], Penicillin g, Shrimp [shellfish allergy], Simvastatin, and Jardiance  [empagliflozin]  ? ?Social History: ?Social History  ? ?Socioeconomic History  ? Marital status: Widowed  ?  Spouse name: Not on file  ? Number of children: 1  ? Years of education: 19  ? Highest education level:

## 2021-12-23 NOTE — Discharge Instructions (Signed)
Everything with your blood work looks normal today.  The CAT scan showed your intestines are normal but you do have narrowing of your arteries in the stomach and legs and that might be the cause of your feet itching and leg cramps.  You should stop smoking as that will make it worse.  You will need to see a specialist about this and a referal was placed.  It also looks like there may be a fibroid in your uterus and you will need to see the GYN about this but mention it to your regular doctor and he should be able to help set this up.  You can take pepto bismal for your stomach and tylenol.  If you get severe pain, vomiting or fever return to the ER. ?

## 2021-12-24 ENCOUNTER — Encounter: Payer: Self-pay | Admitting: Student

## 2021-12-24 ENCOUNTER — Ambulatory Visit (INDEPENDENT_AMBULATORY_CARE_PROVIDER_SITE_OTHER): Payer: HMO | Admitting: Student

## 2021-12-24 VITALS — BP 120/70 | HR 67 | Ht 67.5 in | Wt 194.0 lb

## 2021-12-24 DIAGNOSIS — I2510 Atherosclerotic heart disease of native coronary artery without angina pectoris: Secondary | ICD-10-CM

## 2021-12-24 DIAGNOSIS — I5022 Chronic systolic (congestive) heart failure: Secondary | ICD-10-CM | POA: Diagnosis not present

## 2021-12-24 DIAGNOSIS — N1832 Chronic kidney disease, stage 3b: Secondary | ICD-10-CM

## 2021-12-24 DIAGNOSIS — I10 Essential (primary) hypertension: Secondary | ICD-10-CM | POA: Diagnosis not present

## 2021-12-24 NOTE — Patient Instructions (Signed)
Medication Instructions:  ?Your physician recommends that you continue on your current medications as directed. Please refer to the Current Medication list given to you today. ? ?*If you need a refill on your cardiac medications before your next appointment, please call your pharmacy* ? ? ?Lab Work: ?None ?If you have labs (blood work) drawn today and your tests are completely normal, you will receive your results only by: ?MyChart Message (if you have MyChart) OR ?A paper copy in the mail ?If you have any lab test that is abnormal or we need to change your treatment, we will call you to review the results. ? ? ?Follow-Up: ?At [facility], you and your health needs are our priority.  As part of our continuing mission to provide you with exceptional heart care, we have created designated Provider Care Teams.  These Care Teams include your primary Cardiologist (physician) and Advanced Practice Providers (APPs -  Physician Assistants and Nurse Practitioners) who all work together to provide you with the care you need, when you need it. ? ?We recommend signing up for the patient portal called "MyChart".  Sign up information is provided on this After Visit Summary.  MyChart is used to connect with patients for Virtual Visits (Telemedicine).  Patients are able to view lab/test results, encounter notes, upcoming appointments, etc.  Non-urgent messages can be sent to your provider as well.   ?To learn more about what you can do with MyChart, go to [email or web address].   ? ?Your next appointment:   ?4-6 week(s) to discuss possible CRT upgrade ? ?The format for your next appointment:   ?In Person ? ?Provider:   ?[name] Peru, MD{ ?  ?

## 2022-01-13 ENCOUNTER — Telehealth (HOSPITAL_COMMUNITY): Payer: Self-pay | Admitting: *Deleted

## 2022-01-13 NOTE — Telephone Encounter (Signed)
Lovena Le with [facility] left VM asking for return call to clarify losartan dose. Per last office note 5/10 "- Increase losartan to '50mg'$  daily  (failed Entresto twice due to CP)". I called Lovena Le back at [phone] as requested no answer no vm.

## 2022-01-18 ENCOUNTER — Ambulatory Visit (INDEPENDENT_AMBULATORY_CARE_PROVIDER_SITE_OTHER): Payer: HMO

## 2022-01-18 DIAGNOSIS — I214 Non-ST elevation (NSTEMI) myocardial infarction: Secondary | ICD-10-CM

## 2022-01-18 LAB — CUP PACEART REMOTE DEVICE CHECK
Date Time Interrogation Session: 20230606092724
Implantable Lead Implant Date: 20220606
Implantable Lead Location: 753860
Implantable Lead Model: 377169
Implantable Lead Serial Number: 8000396500
Implantable Pulse Generator Implant Date: 20220606
Pulse Gen Model: 407145
Pulse Gen Serial Number: 70105656

## 2022-01-21 ENCOUNTER — Telehealth: Payer: Self-pay

## 2022-01-21 NOTE — Telephone Encounter (Signed)
Spoke with patient regarding her monitor not being connected for 22 days per [place]. Patient states her roommates son unplugged it and she will plug it back in when she gets help moving the couch.

## 2022-01-25 DIAGNOSIS — I509 Heart failure, unspecified: Secondary | ICD-10-CM | POA: Diagnosis not present

## 2022-01-25 DIAGNOSIS — J449 Chronic obstructive pulmonary disease, unspecified: Secondary | ICD-10-CM | POA: Diagnosis not present

## 2022-01-25 DIAGNOSIS — I10 Essential (primary) hypertension: Secondary | ICD-10-CM | POA: Diagnosis not present

## 2022-01-25 DIAGNOSIS — E1122 Type 2 diabetes mellitus with diabetic chronic kidney disease: Secondary | ICD-10-CM | POA: Diagnosis not present

## 2022-01-25 DIAGNOSIS — E7800 Pure hypercholesterolemia, unspecified: Secondary | ICD-10-CM | POA: Diagnosis not present

## 2022-01-25 DIAGNOSIS — K219 Gastro-esophageal reflux disease without esophagitis: Secondary | ICD-10-CM | POA: Diagnosis not present

## 2022-01-25 DIAGNOSIS — I4891 Unspecified atrial fibrillation: Secondary | ICD-10-CM | POA: Diagnosis not present

## 2022-01-25 DIAGNOSIS — I5020 Unspecified systolic (congestive) heart failure: Secondary | ICD-10-CM | POA: Diagnosis not present

## 2022-01-26 ENCOUNTER — Encounter: Payer: HMO | Admitting: Internal Medicine

## 2022-02-01 ENCOUNTER — Encounter: Payer: HMO | Admitting: Student

## 2022-02-01 NOTE — Progress Notes (Signed)
Remote pacemaker transmission.   

## 2022-02-02 ENCOUNTER — Other Ambulatory Visit: Payer: Self-pay | Admitting: *Deleted

## 2022-02-02 DIAGNOSIS — M79673 Pain in unspecified foot: Secondary | ICD-10-CM

## 2022-02-02 DIAGNOSIS — E119 Type 2 diabetes mellitus without complications: Secondary | ICD-10-CM | POA: Diagnosis not present

## 2022-02-02 DIAGNOSIS — I5042 Chronic combined systolic (congestive) and diastolic (congestive) heart failure: Secondary | ICD-10-CM | POA: Diagnosis not present

## 2022-02-02 DIAGNOSIS — I110 Hypertensive heart disease with heart failure: Secondary | ICD-10-CM | POA: Diagnosis not present

## 2022-02-02 DIAGNOSIS — Z7984 Long term (current) use of oral hypoglycemic drugs: Secondary | ICD-10-CM | POA: Diagnosis not present

## 2022-02-05 ENCOUNTER — Ambulatory Visit (HOSPITAL_COMMUNITY)
Admission: EM | Admit: 2022-02-05 | Discharge: 2022-02-05 | Disposition: A | Discharge status: Home / Self Care | Payer: HMO | Attending: Internal Medicine | Admitting: Internal Medicine

## 2022-02-05 ENCOUNTER — Encounter (HOSPITAL_COMMUNITY): Payer: Self-pay | Admitting: Emergency Medicine

## 2022-02-05 DIAGNOSIS — R051 Acute cough: Secondary | ICD-10-CM | POA: Diagnosis not present

## 2022-02-05 DIAGNOSIS — R350 Frequency of micturition: Secondary | ICD-10-CM | POA: Diagnosis not present

## 2022-02-05 DIAGNOSIS — J40 Bronchitis, not specified as acute or chronic: Secondary | ICD-10-CM | POA: Diagnosis not present

## 2022-02-05 DIAGNOSIS — R300 Dysuria: Secondary | ICD-10-CM | POA: Diagnosis not present

## 2022-02-05 DIAGNOSIS — R079 Chest pain, unspecified: Secondary | ICD-10-CM | POA: Diagnosis not present

## 2022-02-05 LAB — POCT URINALYSIS DIPSTICK, ED / UC
Bilirubin Urine: NEGATIVE
Glucose, UA: NEGATIVE mg/dL
Ketones, ur: NEGATIVE mg/dL
Leukocytes,Ua: NEGATIVE
Nitrite: NEGATIVE
Protein, ur: 30 mg/dL — AB
Specific Gravity, Urine: >=1.03 (ref 1.005–1.030)
Urobilinogen, UA: 0.2 mg/dL (ref 0.0–1.0)
pH: 5 (ref 5.0–8.0)

## 2022-02-05 MED ORDER — DM-GUAIFENESIN ER 30-600 MG PO TB12: 1.0000 | ORAL_TABLET | Freq: Two times a day (BID) | ORAL | 0 refills | Status: DC

## 2022-02-05 MED ORDER — DOXYCYCLINE HYCLATE 100 MG PO CAPS
100.0000 mg | ORAL_CAPSULE | Freq: Two times a day (BID) | ORAL | 0 refills | Status: DC
Start: 2022-02-05 — End: 2022-03-17

## 2022-02-05 MED ORDER — DOXYCYCLINE HYCLATE 100 MG PO CAPS: 100.0000 mg | ORAL_CAPSULE | Freq: Two times a day (BID) | ORAL | 0 refills | Status: DC

## 2022-02-05 NOTE — ED Triage Notes (Signed)
Patient reports a rattling cough, SOB, and chest tightness x 3 weeks that's worse when she's laying down. Appears winded on walking from the lobby to the room. Hx of CABG, also reports problems with her heart valves. Reports a new burning sensation when she urinates, as well as her urine turning the color of tea - starting two weeks prior.

## 2022-02-06 LAB — URINE CULTURE: Culture: <10000 — AB

## 2022-02-09 NOTE — Progress Notes (Signed)
Office Note     CC: Itching bilateral feet Requesting Provider:  Blanchie Dessert, MD  HPI: [name] [name] is a 76 y.o. (1945-09-01) female presenting at the request of .[name], MD for itching appreciated on bilateral feet  [name] [name] is a [place] native, [place].  Retired, she lives independently, and continues to maintain an active lifestyle.  [name] [name] has appreciated bilateral foot itching for quite some time.  She describes this as feeling as though thousands of mosquitoes are biting her feet.  This improves with rubbing her feet.  She denies associated pain but has appreciated some paresthesias.  She denies symptoms of claudication, ischemic rest pain, tissue loss.  Past Medical History:  Diagnosis Date   ([place]) heart failure with improved ejection fraction ([place])    a. 07/2018 Echo: EF 30-35%; b. 11/2019 Echo: EF 55-60%, no rwma, Gr2 DD, Nl RV size/fxn. Mild BAE. Mild MR/AI.   Arthritis    Asthma    Atopic dermatitis    CAD (coronary artery disease)    a. 2016 s/p CABG x 2 (LIMA->LAD, VG->OM); b. 08/2018 MV: EF 44%, no ischemia/infact.   Cardiac arrest ([place]) 10/2014   Cardiomyopathy, ischemic    a. 07/2018 Echo: EF 30-35%; 11/2019 Echo: EF 55-60%.   Carotid arterial disease ([place])    a. 11/2019 Carotid U/S   CHF (congestive heart failure) (HCC)    CKD (chronic kidney disease), stage III (HCC)    COPD (chronic obstructive pulmonary disease) (HCC)    Diabetes mellitus without complication ([place])    Dysrhythmia    Esophageal dilatation 2013   GERD (gastroesophageal reflux disease)    Gout    Headache    Hypertension    Hypokalemia    Idiopathic angioedema    LGI bleed 08/06/2017   a. felt to be hemorrhoidal during that admission (no drop in Hgb).   Lower back pain    Paroxysmal atrial fibrillation (HCC)    a. Dx 2016-->h/o difficult to control rates (complicated by noncompliance), not felt to be a candidate for ablation or  antiarrhythmic due to noncompliance; b. Recurrent AF 2021 - converted w/ IV dilt; c. CHA2DS2VASc = 7-->Xarelto.   Personal history of noncompliance with medical treatment, presenting hazards to health    Prediabetes    Presence of permanent cardiac pacemaker    S/P CABG x 2 with clipping of LA appendage 11/14/2014   LIMA to LAD, SVG to OM, EVH via right thigh   Urine incontinence    Uterine fibroid     Past Surgical History:  Procedure Laterality Date   AV NODE ABLATION N/A 01/18/2021   Procedure: AV NODE ABLATION;  Surgeon: Evans Lance, MD;  Location: [facility];  Service: Cardiovascular;  Laterality: N/A;   BALLOON DILATION N/A 10/10/2019   Procedure: BALLOON DILATION;  Surgeon: Otis Brace, MD;  Location: [facility];  Service: Gastroenterology;  Laterality: N/A;   BIOPSY  10/10/2019   Procedure: BIOPSY;  Surgeon: Otis Brace, MD;  Location: [place];  Service: Gastroenterology;;   BIOPSY  10/11/2019   Procedure: BIOPSY;  Surgeon: Otis Brace, MD;  Location: [place];  Service: Gastroenterology;;   CARDIOVERSION N/A 11/18/2014   Procedure: CARDIOVERSION;  Surgeon: [name], MD;  Location: [place];  Service: Cardiovascular;  Laterality: N/A;   CARDIOVERSION N/A 12/25/2017   Procedure: CARDIOVERSION;  Surgeon: [name], MD;  Location: [place];  Service: Cardiovascular;  Laterality: N/A;  CLIPPING OF ATRIAL APPENDAGE N/A 11/14/2014   Procedure: CLIPPING OF ATRIAL APPENDAGE;  Surgeon: [name], MD;  Location: [place];  Service: Open Heart Surgery;  Laterality: N/A;   COLONOSCOPY  2013   COLONOSCOPY WITH PROPOFOL N/A 10/11/2019   Procedure: COLONOSCOPY WITH PROPOFOL;  Surgeon: Otis Brace, MD;  Location: [place];  Service: Gastroenterology;  Laterality: N/A;   CORONARY ARTERY BYPASS GRAFT N/A 11/14/2014   Procedure: CORONARY ARTERY BYPASS GRAFTING (CABG)TIMES 2 USING LEFT INTERNAL MAMMARY ARTERY AND RIGHT  SAPHENOUS VEIN HARVESTED ENDOSCOPICALLY;  Surgeon: [name], MD;  Location: [place];  Service: Open Heart Surgery;  Laterality: N/A;   ESOPHAGOGASTRODUODENOSCOPY (EGD) WITH PROPOFOL N/A 10/10/2019   Procedure: ESOPHAGOGASTRODUODENOSCOPY (EGD) WITH PROPOFOL;  Surgeon: Otis Brace, MD;  Location: [place];  Service: Gastroenterology;  Laterality: N/A;   ESOPHAGOGASTRODUODENOSCOPY (EGD) WITH PROPOFOL N/A 10/27/2019   Procedure: ESOPHAGOGASTRODUODENOSCOPY (EGD) WITH PROPOFOL;  Surgeon: Ronnette Juniper, MD;  Location: [place];  Service: Gastroenterology;  Laterality: N/A;   GIVENS CAPSULE STUDY N/A 10/27/2019   Procedure: GIVENS CAPSULE STUDY;  Surgeon: Ronnette Juniper, MD;  Location: [place];  Service: Gastroenterology;  Laterality: N/A;   INSERT / REPLACE / REMOVE PACEMAKER     LEFT HEART CATHETERIZATION WITH CORONARY ANGIOGRAM N/A 11/04/2014   Procedure: LEFT HEART CATHETERIZATION WITH CORONARY ANGIOGRAM;  Surgeon: Troy Sine, MD;  Location: [facility];  Service: Cardiovascular;  Laterality: N/A;   PACEMAKER IMPLANT N/A 01/18/2021   Procedure: PACEMAKER IMPLANT;  Surgeon: Evans Lance, MD;  Location: [place] [facility];  Service: Cardiovascular;  Laterality: N/A;   PACEMAKER IMPLANT Left    POLYPECTOMY  10/11/2019   Procedure: POLYPECTOMY;  Surgeon: Otis Brace, MD;  Location: [place] [facility];  Service: Gastroenterology;;   TEE WITHOUT CARDIOVERSION N/A 11/14/2014   Procedure: TRANSESOPHAGEAL ECHOCARDIOGRAM (TEE);  Surgeon: [name], MD;  Location: [place];  Service: Open Heart Surgery;  Laterality: N/A;   TEE WITHOUT CARDIOVERSION N/A 12/25/2017   Procedure: TRANSESOPHAGEAL ECHOCARDIOGRAM (TEE);  Surgeon: [name], MD;  Location[facility];  Service: Cardiovascular;  Laterality: N/A;   TEMPORARY PACEMAKER INSERTION  11/04/2014   Procedure: TEMPORARY PACEMAKER INSERTION;  Surgeon: Troy Sine, MD;  Location: [facility] CATH LAB;  Service: Cardiovascular;;    TOOTH EXTRACTION      Social History   Socioeconomic History   Marital status: Widowed    Spouse name: Not on file   Number of children: 1   Years of education: 41   Highest education level: Not on file  Occupational History   Occupation: RETIRED [employer]  Tobacco Use   Smoking status: Former    Packs/day: 0.50    Years: 56.00    Total pack years: 28.00    Types: Cigarettes    Quit date: 11/13/2021    Years since quitting: 0.2   Smokeless tobacco: Never   Tobacco comments:    smoking 2 cigarettes/day as of 10/23/20  Vaping Use   Vaping Use: Never used  Substance and Sexual Activity   Alcohol use: No    Alcohol/week: 0.0 standard drinks of alcohol   Drug use: No   Sexual activity: Not on file  Other Topics Concern   Not on file  Social History Narrative   Patient reports it being difficult to pay for everything due to outstanding medical bills from hospital stay last year but states she is able to keep up with basic expenses.  Patient does have some concerns with  her house's condition due to a water leak- had her roof repaired last month but has some leaking in the front which has affected her porch.  Patient owns her own car and is able to drive herself but has some concerns about the reliability of her car- has gotten taxi to the clinic for appointment in the past when her car broke down.   Social Determinants of Health   Financial Resource Strain: Low Risk  (06/07/2018)   Overall Financial Resource Strain (CARDIA)    Difficulty of Paying Living Expenses: Not hard at all  Recent Concern: Financial Resource Strain - Medium Risk (05/21/2018)   Overall Financial Resource Strain (CARDIA)    Difficulty of Paying Living Expenses: Somewhat hard  Food Insecurity: No Food Insecurity (05/21/2018)   Hunger Vital Sign    Worried About Running Out of Food in the Last Year: Never true    Ran Out of Food in the Last Year: Never true  Transportation Needs: No Transportation  Needs (05/21/2018)   PRAPARE - Hydrologist (Medical): No    Lack of Transportation (Non-Medical): No  Physical Activity: Not on file  Stress: Not on file  Social Connections: Not on file  Intimate Partner Violence: Not on file   Family History  Problem Relation Age of Onset   Cancer Mother        LYMPHOMA   Heart disease Father    Other Father        TB   CVA Sister    Prostate cancer Brother 64   Diabetes Brother     Current Outpatient Medications  Medication Sig Dispense Refill   acetaminophen (TYLENOL) 325 MG tablet Take 2 tablets (650 mg total) by mouth every 4 (four) hours as needed for headache or mild pain.     allopurinol (ZYLOPRIM) 100 MG tablet Take 100 mg by mouth daily.     atorvastatin (LIPITOR) 10 MG tablet Take 10 mg by mouth at bedtime.     dextromethorphan-guaiFENesin (MUCINEX DM) 30-600 MG 12hr tablet Take 1 tablet by mouth 2 (two) times daily. Take 1/2 tablet every 12 hours for cough. 14 tablet 0   doxycycline (VIBRAMYCIN) 100 MG capsule Take 1 capsule (100 mg total) by mouth 2 (two) times daily. 14 capsule 0   losartan (COZAAR) 50 MG tablet Take 1 tablet (50 mg total) by mouth daily. 30 tablet 6   meclizine (ANTIVERT) 12.5 MG tablet Take 1 tablet (12.5 mg total) by mouth every 6 (six) hours as needed for dizziness. 30 tablet 0   metFORMIN (GLUCOPHAGE) 500 MG tablet Take 500 mg by mouth daily with breakfast. Daily with a meal     metoprolol succinate (TOPROL-XL) 100 MG 24 hr tablet Take 1 tablet (100 mg total) by mouth daily. Take with or immediately following a meal. 90 tablet 3   pantoprazole (PROTONIX) 40 MG tablet Take 40 mg by mouth daily.     Prednisol Ace-Moxiflox-Bromfen 1-0.5-0.075 % SUSP Place 1 drop into the right eye 4 (four) times daily.     rivaroxaban (XARELTO) 20 MG TABS tablet Take 1 tablet (20 mg total) by mouth daily with supper. Resume from 11/01/2019     spironolactone (ALDACTONE) 25 MG tablet Take 1 tablet (25 mg  total) by mouth daily. 30 tablet 11   Tiotropium Bromide Monohydrate (SPIRIVA RESPIMAT) 2.5 MCG/ACT AERS Inhale 2 puffs into the lungs daily. (Patient taking differently: Inhale 2 puffs into the lungs as needed.) 1 g 5  torsemide (DEMADEX) 20 MG tablet Take 20 mg by mouth daily.     No current facility-administered medications for this visit.    Allergies  Allergen Reactions   Bee Venom Anaphylaxis   Ivp Dye [Iodinated Contrast Media] Anaphylaxis   Ace Inhibitors     Other reaction(s): angioedema   Atenolol     Other reaction(s): severe headaches   Codeine Nausea And Vomiting   Entresto [Sacubitril-Valsartan] Other (See Comments)    Chest pain    Penicillin G     Other reaction(s): rash   Shrimp [Shellfish Allergy] Swelling   Simvastatin     Other reaction(s): not sure   Jardiance [Empagliflozin] Other (See Comments)    Caused Boils      REVIEW OF SYSTEMS:  '[X]'$  denotes positive finding, '[ ]'$  denotes negative finding Cardiac  Comments:  Chest pain or chest pressure:    Shortness of breath upon exertion:    Short of breath when lying flat:    Irregular heart rhythm:        Vascular    Pain in calf, thigh, or hip brought on by ambulation:    Pain in feet at night that wakes you up from your sleep:     Blood clot in your veins:    Leg swelling:         Pulmonary    Oxygen at home:    Productive cough:     Wheezing:         Neurologic    Sudden weakness in arms or legs:     Sudden numbness in arms or legs:     Sudden onset of difficulty speaking or slurred speech:    Temporary loss of vision in one eye:     Problems with dizziness:         Gastrointestinal    Blood in stool:     Vomited blood:         Genitourinary    Burning when urinating:     Blood in urine:        Psychiatric    Major depression:         Hematologic    Bleeding problems:    Problems with blood clotting too easily:        Skin    Rashes or ulcers:        Constitutional    Fever  or chills:      PHYSICAL EXAMINATION:  There were no vitals filed for this visit.  General:  WDWN in NAD; vital signs documented above Gait: Not observed HENT: WNL, normocephalic Pulmonary: normal non-labored breathing , without wheezing Cardiac: regular HR Abdomen: soft, NT, no masses Skin: without rashes Vascular Exam/Pulses:  Right Left  Radial 2+ (normal) 2+ (normal)  Ulnar    Femoral none none  Popliteal    DP absent absent  PT absent absent   Extremities: without ischemic changes, without Gangrene , without cellulitis; without open wounds;  Musculoskeletal: no muscle wasting or atrophy  Neurologic: A&O X 3;  No focal weakness or paresthesias are detected Psychiatric:  The pt has Normal affect.   Non-Invasive Vascular Imaging:   ABI Findings:  +---------+------------------+-----+----------+--------+  Right    Rt Pressure (mmHg)IndexWaveform  Comment   +---------+------------------+-----+----------+--------+  Brachial 128                                        +---------+------------------+-----+----------+--------+  PTA      74                0.58 monophasic          +---------+------------------+-----+----------+--------+  DP       74                0.58 monophasic          +---------+------------------+-----+----------+--------+  Great Toe50                0.39                     +---------+------------------+-----+----------+--------+   +---------+------------------+-----+----------+-------+  Left     Lt Pressure (mmHg)IndexWaveform  Comment  +---------+------------------+-----+----------+-------+  Brachial 127                                       +---------+------------------+-----+----------+-------+  PTA      94                0.73 monophasic         +---------+------------------+-----+----------+-------+  DP       56                0.44 monophasic          +---------+------------------+-----+----------+-------+  Great Toe54                0.42                    +---------+------------------+-----+----------+-------+   Summary:  Right: Resting right ankle-brachial index indicates moderate right lower  extremity arterial disease. The right toe-brachial index is abnormal.   Left: Resting left ankle-brachial index indicates mild left lower  extremity arterial disease. The left toe-brachial index is abnormal.     ASSESSMENT/PLAN: [name] [name] is a 76 y.o. female presenting with symptoms consistent with neuropathy.  Per Oleta Mouse she is prediabetic, and has been for quite some time.  ABIs were reviewed demonstrating significant peripheral arterial disease bilaterally.  Fortunately, she remains asymptomatic.  We had a long discussion regarding the above.  We discussed the possible diagnosis of neuropathy, as well as the peripheral arterial disease diagnosis demonstrated by ABI.  I asked that she contact my office immediately should she have calf or thigh cramping that becomes lifestyle limiting, or should she have rest pain or tissue loss as her toe pressures are diminished bilaterally.  On physical exam, [name] [name] had nonpalpable femoral pulses bilaterally.  Should her symptoms progress, I would recommend CT angio abdomen pelvis with runoff in an effort to define inflow disease.  My plan is to see her in 6 months.  I asked that she continue her Xarelto and statin therapy.   Broadus John, MD [facility] [phone]

## 2022-02-11 ENCOUNTER — Ambulatory Visit: Payer: HMO | Admitting: Vascular Surgery

## 2022-02-11 ENCOUNTER — Encounter: Payer: Self-pay | Admitting: Vascular Surgery

## 2022-02-11 ENCOUNTER — Ambulatory Visit (HOSPITAL_COMMUNITY)
Admission: RE | Admit: 2022-02-11 | Discharge: 2022-02-11 | Disposition: A | Discharge status: Home / Self Care | Payer: HMO | Source: Ambulatory Visit | Attending: Vascular Surgery | Admitting: Vascular Surgery

## 2022-02-11 VITALS — BP 105/70 | HR 69 | Temp 97.1°F | Resp 14 | Ht 67.0 in | Wt 185.0 lb

## 2022-02-11 DIAGNOSIS — M79673 Pain in unspecified foot: Secondary | ICD-10-CM | POA: Diagnosis not present

## 2022-02-11 DIAGNOSIS — I739 Peripheral vascular disease, unspecified: Secondary | ICD-10-CM

## 2022-02-17 ENCOUNTER — Other Ambulatory Visit: Payer: Self-pay

## 2022-02-17 DIAGNOSIS — I739 Peripheral vascular disease, unspecified: Secondary | ICD-10-CM

## 2022-02-22 ENCOUNTER — Encounter: Payer: Self-pay | Admitting: Internal Medicine

## 2022-02-22 ENCOUNTER — Ambulatory Visit (INDEPENDENT_AMBULATORY_CARE_PROVIDER_SITE_OTHER): Payer: HMO | Admitting: Internal Medicine

## 2022-02-22 VITALS — BP 126/84 | HR 72 | Ht 67.0 in | Wt 193.6 lb

## 2022-02-22 DIAGNOSIS — Z950 Presence of cardiac pacemaker: Secondary | ICD-10-CM | POA: Diagnosis not present

## 2022-02-22 DIAGNOSIS — I442 Atrioventricular block, complete: Secondary | ICD-10-CM | POA: Diagnosis not present

## 2022-02-22 DIAGNOSIS — I4819 Other persistent atrial fibrillation: Secondary | ICD-10-CM | POA: Diagnosis not present

## 2022-02-22 DIAGNOSIS — I5042 Chronic combined systolic (congestive) and diastolic (congestive) heart failure: Secondary | ICD-10-CM

## 2022-02-22 NOTE — Progress Notes (Signed)
HPI [name] [name] is referred back today for consideration of biv ICD upgrade. She is a pleasant 76 yo woman with a h/o longstanding persistent atrial fib for over 2 years. She has a h/o CAD s/p CABG. She had was thought to be a tachy induced CM due to rapid uncontrolled atrial fib and underwent insertion of a DDD PM and a left bundle area lead and AV node ablation. Her rates have been controlled. Unfortunately she continues to have class 3 symptoms and her EF has not improved. She has not had syncope. She is quite anxious. Today for her visit, her daughter who resides in [place] is listening.  Allergies  Allergen Reactions   Bee Venom Anaphylaxis   Ivp Dye [Iodinated Contrast Media] Anaphylaxis   Ace Inhibitors     Other reaction(s): angioedema   Atenolol     Other reaction(s): severe headaches   Codeine Nausea And Vomiting   Entresto [Sacubitril-Valsartan] Other (See Comments)    Chest pain    Penicillin G     Other reaction(s): rash   Shrimp [Shellfish Allergy] Swelling   Simvastatin     Other reaction(s): not sure   Jardiance [Empagliflozin] Other (See Comments)    Caused Boils      Current Outpatient Medications  Medication Sig Dispense Refill   acetaminophen (TYLENOL) 325 MG tablet Take 2 tablets (650 mg total) by mouth every 4 (four) hours as needed for headache or mild pain.     allopurinol (ZYLOPRIM) 100 MG tablet Take 100 mg by mouth daily.     atorvastatin (LIPITOR) 10 MG tablet Take 10 mg by mouth at bedtime.     dextromethorphan-guaiFENesin (MUCINEX DM) 30-600 MG 12hr tablet Take 1 tablet by mouth 2 (two) times daily. Take 1/2 tablet every 12 hours for cough. 14 tablet 0   doxycycline (VIBRAMYCIN) 100 MG capsule Take 1 capsule (100 mg total) by mouth 2 (two) times daily. 14 capsule 0   losartan (COZAAR) 50 MG tablet Take 1 tablet (50 mg total) by mouth daily. 30 tablet 6   meclizine (ANTIVERT) 12.5 MG tablet Take 1 tablet (12.5 mg total) by mouth every 6 (six) hours as  needed for dizziness. 30 tablet 0   metFORMIN (GLUCOPHAGE) 500 MG tablet Take 500 mg by mouth daily with breakfast. Daily with a meal     metoprolol succinate (TOPROL-XL) 100 MG 24 hr tablet Take 1 tablet (100 mg total) by mouth daily. Take with or immediately following a meal. 90 tablet 3   pantoprazole (PROTONIX) 40 MG tablet Take 40 mg by mouth daily.     Prednisol Ace-Moxiflox-Bromfen 1-0.5-0.075 % SUSP Place 1 drop into the right eye 4 (four) times daily.     rivaroxaban (XARELTO) 20 MG TABS tablet Take 1 tablet (20 mg total) by mouth daily with supper. Resume from 11/01/2019     spironolactone (ALDACTONE) 25 MG tablet Take 1 tablet (25 mg total) by mouth daily. 30 tablet 11   Tiotropium Bromide Monohydrate (SPIRIVA RESPIMAT) 2.5 MCG/ACT AERS Inhale 2 puffs into the lungs daily. (Patient taking differently: Inhale 2 puffs into the lungs as needed.) 1 g 5   torsemide (DEMADEX) 20 MG tablet Take 20 mg by mouth daily.     No current facility-administered medications for this visit.     Past Medical History:  Diagnosis Date   ([place]) heart failure with improved ejection fraction ([place])    a. 07/2018 Echo: EF 30-35%; b. 11/2019 Echo: EF 55-60%, no  rwma, Gr2 DD, Nl RV size/fxn. Mild BAE. Mild MR/AI.   Arthritis    Asthma    Atopic dermatitis    CAD (coronary artery disease)    a. 2016 s/p CABG x 2 (LIMA->LAD, VG->OM); b. 08/2018 MV: EF 44%, no ischemia/infact.   Cardiac arrest ([place]) 10/2014   Cardiomyopathy, ischemic    a. 07/2018 Echo: EF 30-35%; 11/2019 Echo: EF 55-60%.   Carotid arterial disease ([facility])    a. 11/2019 Carotid U/S   CHF (congestive heart failure) (HCC)    CKD (chronic kidney disease), stage III (HCC)    COPD (chronic obstructive pulmonary disease) (HCC)    Diabetes mellitus without complication ([place])    Dysrhythmia    Esophageal dilatation 2013   GERD (gastroesophageal reflux disease)    Gout    Headache    Hypertension    Hypokalemia    Idiopathic angioedema    LGI  bleed 08/06/2017   a. felt to be hemorrhoidal during that admission (no drop in Hgb).   Lower back pain    Paroxysmal atrial fibrillation (HCC)    a. Dx 2016-->h/o difficult to control rates (complicated by noncompliance), not felt to be a candidate for ablation or antiarrhythmic due to noncompliance; b. Recurrent AF 2021 - converted w/ IV dilt; c. CHA2DS2VASc = 7-->Xarelto.   Personal history of noncompliance with medical treatment, presenting hazards to health    Prediabetes    Presence of permanent cardiac pacemaker    S/P CABG x 2 with clipping of LA appendage 11/14/2014   LIMA to LAD, SVG to OM, EVH via right thigh   Urine incontinence    Uterine fibroid     ROS:   All systems reviewed and negative except as noted in the HPI.   Past Surgical History:  Procedure Laterality Date   AV NODE ABLATION N/A 01/18/2021   Procedure: AV NODE ABLATION;  Surgeon: Evans Lance, MD;  Location: [facility];  Service: Cardiovascular;  Laterality: N/A;   BALLOON DILATION N/A 10/10/2019   Procedure: BALLOON DILATION;  Surgeon: Otis Brace, MD;  Location: [facility];  Service: Gastroenterology;  Laterality: N/A;   BIOPSY  10/10/2019   Procedure: BIOPSY;  Surgeon: Otis Brace, MD;  Location: [facility];  Service: Gastroenterology;;   BIOPSY  10/11/2019   Procedure: BIOPSY;  Surgeon: Otis Brace, MD;  Location: [facility];  Service: Gastroenterology;;   CARDIOVERSION N/A 11/18/2014   Procedure: CARDIOVERSION;  Surgeon: [name], MD;  Location: [facility];  Service: Cardiovascular;  Laterality: N/A;   CARDIOVERSION N/A 12/25/2017   Procedure: CARDIOVERSION;  Surgeon: [name], MD;  Location: [facility];  Service: Cardiovascular;  Laterality: N/A;   CLIPPING OF ATRIAL APPENDAGE N/A 11/14/2014   Procedure: CLIPPING OF ATRIAL APPENDAGE;  Surgeon: [name], MD;  Location: [facility];  Service: Open Heart Surgery;  Laterality: N/A;   COLONOSCOPY  2013    COLONOSCOPY WITH PROPOFOL N/A 10/11/2019   Procedure: COLONOSCOPY WITH PROPOFOL;  Surgeon: Otis Brace, MD;  Location: [facility];  Service: Gastroenterology;  Laterality: N/A;   CORONARY ARTERY BYPASS GRAFT N/A 11/14/2014   Procedure: CORONARY ARTERY BYPASS GRAFTING (CABG)TIMES 2 USING LEFT INTERNAL MAMMARY ARTERY AND RIGHT SAPHENOUS VEIN HARVESTED ENDOSCOPICALLY;  Surgeon: [name], MD;  Location: [facility];  Service: Open Heart Surgery;  Laterality: N/A;   ESOPHAGOGASTRODUODENOSCOPY (EGD) WITH PROPOFOL N/A 10/10/2019   Procedure: ESOPHAGOGASTRODUODENOSCOPY (EGD) WITH PROPOFOL;  Surgeon: Otis Brace, MD;  Location: [place];  Service: Gastroenterology;  Laterality: N/A;   ESOPHAGOGASTRODUODENOSCOPY (EGD) WITH PROPOFOL N/A 10/27/2019   Procedure: ESOPHAGOGASTRODUODENOSCOPY (EGD) WITH PROPOFOL;  Surgeon: Ronnette Juniper, MD;  Location: [facility] [place] [facility];  Service: Gastroenterology;  Laterality: N/A;   GIVENS CAPSULE STUDY N/A 10/27/2019   Procedure: GIVENS CAPSULE STUDY;  Surgeon: Ronnette Juniper, MD;  Location: [facility];  Service: Gastroenterology;  Laterality: N/A;   INSERT / REPLACE / REMOVE PACEMAKER     LEFT HEART CATHETERIZATION WITH CORONARY ANGIOGRAM N/A 11/04/2014   Procedure: LEFT HEART CATHETERIZATION WITH CORONARY ANGIOGRAM;  Surgeon: Troy Sine, MD;  Location[facility];  Service: Cardiovascular;  Laterality: N/A;   PACEMAKER IMPLANT N/A 01/18/2021   Procedure: PACEMAKER IMPLANT;  Surgeon: Evans Lance, MD;  Location: [facility];  Service: Cardiovascular;  Laterality: N/A;   PACEMAKER IMPLANT Left    POLYPECTOMY  10/11/2019   Procedure: POLYPECTOMY;  Surgeon: Otis Brace, MD;  Location: [facility];  Service: Gastroenterology;;   TEE WITHOUT CARDIOVERSION N/A 11/14/2014   Procedure: TRANSESOPHAGEAL ECHOCARDIOGRAM (TEE);  Surgeon: [name], MD;  Location: [place];  Service: Open Heart Surgery;  Laterality: N/A;   TEE WITHOUT CARDIOVERSION N/A  12/25/2017   Procedure: TRANSESOPHAGEAL ECHOCARDIOGRAM (TEE);  Surgeon: [name], MD;  Location: [facility];  Service: Cardiovascular;  Laterality: N/A;   TEMPORARY PACEMAKER INSERTION  11/04/2014   Procedure: TEMPORARY PACEMAKER INSERTION;  Surgeon: Troy Sine, MD;  Location: [facility];  Service: Cardiovascular;;   TOOTH EXTRACTION       Family History  Problem Relation Age of Onset   Cancer Mother        LYMPHOMA   Heart disease Father    Other Father        TB   CVA Sister    Prostate cancer Brother 71   Diabetes Brother      Social History   Socioeconomic History   Marital status: Widowed    Spouse name: Not on file   Number of children: 1   Years of education: 12   Highest education level: Not on file  Occupational History   Occupation: RETIRED [employer]  Tobacco Use   Smoking status: Former    Packs/day: 0.50    Years: 56.00    Total pack years: 28.00    Types: Cigarettes    Quit date: 11/13/2021    Years since quitting: 0.2   Smokeless tobacco: Never   Tobacco comments:    smoking 2 cigarettes/day as of 10/23/20  Vaping Use   Vaping Use: Never used  Substance and Sexual Activity   Alcohol use: No    Alcohol/week: 0.0 standard drinks of alcohol   Drug use: No   Sexual activity: Not on file  Other Topics Concern   Not on file  Social History Narrative   Patient reports it being difficult to pay for everything due to outstanding medical bills from hospital stay last year but states she is able to keep up with basic expenses.  Patient does have some concerns with her house's condition due to a water leak- had her roof repaired last month but has some leaking in the front which has affected her porch.  Patient owns her own car and is able to drive herself but has some concerns about the reliability of her car- has gotten taxi to the clinic for appointment in the past when her car broke down.   Social Determinants of Health    Financial Resource Strain: Low Risk  (06/07/2018)  Overall Financial Resource Strain (CARDIA)    Difficulty of Paying Living Expenses: Not hard at all  Recent Concern: Financial Resource Strain - Medium Risk (05/21/2018)   Overall Financial Resource Strain (CARDIA)    Difficulty of Paying Living Expenses: Somewhat hard  Food Insecurity: No Food Insecurity (05/21/2018)   Hunger Vital Sign    Worried About Running Out of Food in the Last Year: Never true    Ran Out of Food in the Last Year: Never true  Transportation Needs: No Transportation Needs (05/21/2018)   PRAPARE - Hydrologist (Medical): No    Lack of Transportation (Non-Medical): No  Physical Activity: Not on file  Stress: Not on file  Social Connections: Not on file  Intimate Partner Violence: Not on file     BP 126/84   Pulse 72   Ht '5\' 7"'$  (1.702 m)   Wt 193 lb 9.6 oz (87.8 kg)   SpO2 97%   BMI 30.32 kg/m   Physical Exam:  Chronically ill appearing 76 yo woman, NAD HEENT: Unremarkable Neck:  No JVD, no thyromegally Lymphatics:  No adenopathy Back:  No CVA tenderness Lungs:  Clear with no wheezes HEART:  Regular rate rhythm, no murmurs, no rubs, no clicks Abd:  soft, positive bowel sounds, no organomegally, no rebound, no guarding Ext:  2 plus pulses, no edema, no cyanosis, no clubbing Skin:  No rashes no nodules Neuro:  CN II through XII intact, motor grossly intact  EKG - atrial fib with ventricular pacing at 70/min  DEVICE  Normal device function.  See PaceArt for details.   Assess/Plan:  1. Chronic systolic heart failure - I have discussed the treatment options with the patient and her daughter and offered 3 options to try and make her feel better.    A. Upgrade to a biv ICD   B. Start dofetilide and if she is unable to conduct, upgrade to a biv ICD  C. Start amiodarone, DCCV and upgrade to an ICD. I have explained these and written my thought down for her to ponder and she  will call us and let us know what she would like to pursue.   2. Persistent atrial fib - getting her back to NSR is going to be difficult and she will call us if she would like to proceed. Her rates are well controlled.  3. HTN - her bp is controlled. No change in her meds.  4. Non-compliance - this makes the situation much more difficulty.   [name] Overlie Dawana Asper,MD

## 2022-02-22 NOTE — Patient Instructions (Addendum)
Medication Instructions:  Your physician recommends that you continue on your current medications as directed. Please refer to the Current Medication list given to you today.  Labwork: None ordered.  Testing/Procedures: None ordered.  Follow-Up: Your physician wants you to follow-up in: 4 months with [name], MD  Remote monitoring is used to monitor your Pacemaker from home. This monitoring reduces the number of office visits required to check your device to one time per year. It allows [place] to keep an eye on the functioning of your device to ensure it is working properly. You are scheduled for a device check from home on 04/19/2022. You may send your transmission at any time that day. If you have a wireless device, the transmission will be sent automatically. After your physician reviews your transmission, you will receive a postcard with your next transmission date.  Any Other Special Instructions Will Be Listed Below (If Applicable).  If you need a refill on your cardiac medications before your next appointment, please call your pharmacy.   Important Information About Sugar

## 2022-03-05 ENCOUNTER — Ambulatory Visit (INDEPENDENT_AMBULATORY_CARE_PROVIDER_SITE_OTHER): Payer: PPO

## 2022-03-05 ENCOUNTER — Ambulatory Visit (HOSPITAL_COMMUNITY)
Admission: EM | Admit: 2022-03-05 | Discharge: 2022-03-05 | Disposition: A | Discharge status: Home / Self Care | Payer: PPO | Attending: Emergency Medicine | Admitting: Emergency Medicine

## 2022-03-05 ENCOUNTER — Encounter (HOSPITAL_COMMUNITY): Payer: Self-pay | Admitting: Emergency Medicine

## 2022-03-05 DIAGNOSIS — J90 Pleural effusion, not elsewhere classified: Secondary | ICD-10-CM | POA: Diagnosis not present

## 2022-03-05 DIAGNOSIS — R059 Cough, unspecified: Secondary | ICD-10-CM | POA: Diagnosis not present

## 2022-03-05 DIAGNOSIS — R0602 Shortness of breath: Secondary | ICD-10-CM | POA: Diagnosis not present

## 2022-03-05 MED ORDER — CEFDINIR 300 MG PO CAPS: 300.0000 mg | ORAL_CAPSULE | Freq: Two times a day (BID) | ORAL | 0 refills | Status: AC

## 2022-03-05 MED ORDER — PREDNISONE 20 MG PO TABS: 40.0000 mg | ORAL_TABLET | Freq: Every day | ORAL | 0 refills | Status: DC

## 2022-03-05 MED ORDER — METHYLPREDNISOLONE SODIUM SUCC 125 MG IJ SOLR
INTRAMUSCULAR | Status: AC
  Filled 2022-03-05: qty 2

## 2022-03-05 MED ORDER — IPRATROPIUM-ALBUTEROL 0.5-2.5 (3) MG/3ML IN SOLN
RESPIRATORY_TRACT | Status: AC
  Filled 2022-03-05: qty 3

## 2022-03-05 MED ORDER — METHYLPREDNISOLONE SODIUM SUCC 125 MG IJ SOLR
60.0000 mg | Freq: Once | INTRAMUSCULAR | Status: AC
  Administered 2022-03-05: 60 mg via INTRAMUSCULAR

## 2022-03-05 MED ORDER — IPRATROPIUM-ALBUTEROL 0.5-2.5 (3) MG/3ML IN SOLN
3.0000 mL | Freq: Once | RESPIRATORY_TRACT | Status: AC
  Administered 2022-03-05: 3 mL via RESPIRATORY_TRACT

## 2022-03-05 NOTE — ED Provider Notes (Signed)
[place]    CSN: [id number] Arrival date & time: 03/05/22  1418      History   Chief Complaint Chief Complaint  Patient presents with   Shortness of Breath    HPI [name] [name] is a 76 y.o. female.   She presents with chest congestion and shortness of breath at rest for 3 weeks.  Endorses coughing only when attempting to remove mucus.  Symptoms are worsening at nighttime.  Was evaluated 3 weeks ago in urgent care and completed doxycycline and attempted use of Mucinex which she deems ineffective.  Denies chest pain or tightness or wheezing.  History of CHF, COPD, CKD, hypertension, diabetes mellitus, cardiomyopathy, asthma.  Endorses taking daily diuretics and inhalers as directed.  Denies fever, chills, body aches, URI symptoms.  Tolerating food and liquids.  Past Medical History:  Diagnosis Date   ([place]) heart failure with improved ejection fraction ([place])    a. 07/2018 Echo: EF 30-35%; b. 11/2019 Echo: EF 55-60%, no rwma, Gr2 DD, Nl RV size/fxn. Mild BAE. Mild MR/AI.   Arthritis    Asthma    Atopic dermatitis    CAD (coronary artery disease)    a. 2016 s/p CABG x 2 (LIMA->LAD, VG->OM); b. 08/2018 MV: EF 44%, no ischemia/infact.   Cardiac arrest ([place]) 10/2014   Cardiomyopathy, ischemic    a. 07/2018 Echo: EF 30-35%; 11/2019 Echo: EF 55-60%.   Carotid arterial disease ([place])    a. 11/2019 Carotid U/S   CHF (congestive heart failure) (HCC)    CKD (chronic kidney disease), stage III (HCC)    COPD (chronic obstructive pulmonary disease) (HCC)    Diabetes mellitus without complication ([place])    Dysrhythmia    Esophageal dilatation 2013   GERD (gastroesophageal reflux disease)    Gout    Headache    Hypertension    Hypokalemia    Idiopathic angioedema    LGI bleed 08/06/2017   a. felt to be hemorrhoidal during that admission (no drop in Hgb).   Lower back pain    Paroxysmal atrial fibrillation (HCC)    a. Dx 2016-->h/o difficult to control rates (complicated by  noncompliance), not felt to be a candidate for ablation or antiarrhythmic due to noncompliance; b. Recurrent AF 2021 - converted w/ IV dilt; c. CHA2DS2VASc = 7-->Xarelto.   Personal history of noncompliance with medical treatment, presenting hazards to health    Prediabetes    Presence of permanent cardiac pacemaker    S/P CABG x 2 with clipping of LA appendage 11/14/2014   LIMA to LAD, SVG to OM, EVH via right thigh   Urine incontinence    Uterine fibroid     Patient Active Problem List   Diagnosis Date Noted   Complete heart block ([place]) 02/22/2022   Pacemaker 02/22/2022   Hypertensive urgency 06/08/2021   Elevated troponin 02/10/2021   Acute respiratory failure with hypoxia ([place]) 12/08/2020   Hypotension 12/08/2020   Hypoalbuminemia 12/08/2020   Obstructive sleep apnea 09/21/2020   Atrial fibrillation with rapid ventricular response ([place]) 08/13/2020   Acute lower GI bleeding 02/10/2020   Tobacco dependence 02/10/2020   Obesity (BMI 30.0-34.9) 02/10/2020   Acute upper GI bleeding 10/26/2019   COPD (chronic obstructive pulmonary disease) ([place]) 10/26/2019   Acute GI bleeding 10/09/2019   Acute blood loss anemia 10/09/2019   Postmenopausal vaginal bleeding 01/04/2018   Snoring 01/04/2018   Acute on chronic combined systolic and diastolic CHF (congestive heart failure) ([place])    Noncompliance 10/16/2017  Acute on chronic congestive heart failure (HCC)    Thrombocytopenia (HCC) 09/14/2017   Acute drug-induced gout of right foot    Medication noncompliance due to cognitive impairment 96/28/3662   Acute diastolic CHF (congestive heart failure) ([place]) 08/06/2017   LGI bleed, likely hemorrhoids 08/06/2017   Persistent atrial fibrillation ([place]) 08/04/2017   Paroxysmal atrial fibrillation ([place]) 02/08/2017   Cardiomyopathy, ischemic 02/08/2017   Dysphagia 02/08/2017   CKD (chronic kidney disease), stage III ([place]) 01/30/2017   Pain in shoulder 02/08/2016   Breast pain, left 02/08/2016    Bilateral arm numbness and tingling while sleeping 02/08/2016   Painful lumpy left breast 09/23/2015   [place]idal intertrigo 02/11/2015   S/P CABG x 2 11/14/2014   Accelerated hypertension    Cardiac arrest ([place]) 11/04/2014   Left main coronary artery disease 11/04/2014   Coronary artery disease due to lipid rich plaque    Acute respiratory failure with hypoxemia [facility])    Essential hypertension    Atrial fibrillation with RVR (HCC)    Hypokalemia 10/30/2014   Chronic combined systolic and diastolic CHF (congestive heart failure) ([place]) 10/30/2014   NSTEMI (non-ST elevated myocardial infarction) ([place])    DOE (dyspnea on exertion)    CAP (community acquired pneumonia) 10/29/2014    Past Surgical History:  Procedure Laterality Date   AV NODE ABLATION N/A 01/18/2021   Procedure: AV NODE ABLATION;  Surgeon: Evans Lance, MD;  Location: [facility] [facility];  Service: Cardiovascular;  Laterality: N/A;   BALLOON DILATION N/A 10/10/2019   Procedure: BALLOON DILATION;  Surgeon: Otis Brace, MD;  Location: [facility]OS[facility];  Service: Gastroenterology;  Laterality: N/A;   BIOPSY  10/10/2019   Procedure: BIOPSY;  Surgeon: Otis Brace, MD;  Location: [place];  Service: Gastroenterology;;   BIOPSY  10/11/2019   Procedure: BIOPSY;  Surgeon: Otis Brace, MD;  Location: [place];  Service: Gastroenterology;;   CARDIOVERSION N/A 11/18/2014   Procedure: CARDIOVERSION;  Surgeon: [name], MD;  Location: [place];  Service: Cardiovascular;  Laterality: N/A;   CARDIOVERSION N/A 12/25/2017   Procedure: CARDIOVERSION;  Surgeon: [name], MD;  Location: [place];  Service: Cardiovascular;  Laterality: N/A;   CLIPPING OF ATRIAL APPENDAGE N/A 11/14/2014   Procedure: CLIPPING OF ATRIAL APPENDAGE;  Surgeon: [name], MD;  Location: [place];  Service: Open Heart Surgery;  Laterality: N/A;   COLONOSCOPY  2013   COLONOSCOPY WITH PROPOFOL N/A 10/11/2019   Procedure:  COLONOSCOPY WITH PROPOFOL;  Surgeon: Otis Brace, MD;  Location: [place];  Service: Gastroenterology;  Laterality: N/A;   CORONARY ARTERY BYPASS GRAFT N/A 11/14/2014   Procedure: CORONARY ARTERY BYPASS GRAFTING (CABG)TIMES 2 USING LEFT INTERNAL MAMMARY ARTERY AND RIGHT SAPHENOUS VEIN HARVESTED ENDOSCOPICALLY;  Surgeon: [name], MD;  Location: [place];  Service: Open Heart Surgery;  Laterality: N/A;   ESOPHAGOGASTRODUODENOSCOPY (EGD) WITH PROPOFOL N/A 10/10/2019   Procedure: ESOPHAGOGASTRODUODENOSCOPY (EGD) WITH PROPOFOL;  Surgeon: Otis Brace, MD;  Location: [place];  Service: Gastroenterology;  Laterality: N/A;   ESOPHAGOGASTRODUODENOSCOPY (EGD) WITH PROPOFOL N/A 10/27/2019   Procedure: ESOPHAGOGASTRODUODENOSCOPY (EGD) WITH PROPOFOL;  Surgeon: Ronnette Juniper, MD;  Location: [place];  Service: Gastroenterology;  Laterality: N/A;   GIVENS CAPSULE STUDY N/A 10/27/2019   Procedure: GIVENS CAPSULE STUDY;  Surgeon: Ronnette Juniper, MD;  Location: [place];  Service: Gastroenterology;  Laterality: N/A;   INSERT / REPLACE / REMOVE PACEMAKER     LEFT HEART CATHETERIZATION WITH CORONARY ANGIOGRAM N/A 11/04/2014   Procedure: LEFT HEART  CATHETERIZATION WITH CORONARY ANGIOGRAM;  Surgeon: Troy Sine, MD;  Location[facility] CATH LAB;  Service: Cardiovascular;  Laterality: N/A;   PACEMAKER IMPLANT N/A 01/18/2021   Procedure: PACEMAKER IMPLANT;  Surgeon: Evans Lance, MD;  Location: [place] CV LAB;  Service: Cardiovascular;  Laterality: N/A;   PACEMAKER IMPLANT Left    POLYPECTOMY  10/11/2019   Procedure: POLYPECTOMY;  Surgeon: Otis Brace, MD;  Location: [facility] ENDOSCOPY;  Service: Gastroenterology;;   TEE WITHOUT CARDIOVERSION N/A 11/14/2014   Procedure: TRANSESOPHAGEAL ECHOCARDIOGRAM (TEE);  Surgeon: [name], MD;  Location: [place];  Service: Open Heart Surgery;  Laterality: N/A;   TEE WITHOUT CARDIOVERSION N/A 12/25/2017   Procedure: TRANSESOPHAGEAL ECHOCARDIOGRAM  (TEE);  Surgeon: [name], MD;  Location[facility] ENDOSCOPY;  Service: Cardiovascular;  Laterality: N/A;   TEMPORARY PACEMAKER INSERTION  11/04/2014   Procedure: TEMPORARY PACEMAKER INSERTION;  Surgeon: Troy Sine, MD;  Location: [facility] CATH LAB;  Service: Cardiovascular;;   TOOTH EXTRACTION      OB History   No obstetric history on file.      Home Medications    Prior to Admission medications   Medication Sig Start Date End Date Taking? Authorizing Provider  acetaminophen (TYLENOL) 325 MG tablet Take 2 tablets (650 mg total) by mouth every 4 (four) hours as needed for headache or mild pain. 12/09/20   Mikhail, Velta Addison, DO  allopurinol (ZYLOPRIM) 100 MG tablet Take 100 mg by mouth daily. 07/19/21   [provider]  atorvastatin (LIPITOR) 10 MG tablet Take 10 mg by mouth at bedtime. 01/22/21   [provider]  dextromethorphan-guaiFENesin (MUCINEX DM) 30-600 MG 12hr tablet Take 1 tablet by mouth 2 (two) times daily. Take 1/2 tablet every 12 hours for cough. 02/05/22   Nyoka Lint, PA-C  doxycycline (VIBRAMYCIN) 100 MG capsule Take 1 capsule (100 mg total) by mouth 2 (two) times daily. 02/05/22   Nyoka Lint, PA-C  losartan (COZAAR) 50 MG tablet Take 1 tablet (50 mg total) by mouth daily. 12/22/21   Bensimhon, Shaune Pascal, MD  meclizine (ANTIVERT) 12.5 MG tablet Take 1 tablet (12.5 mg total) by mouth every 6 (six) hours as needed for dizziness. 05/18/21   [place]ford, Maricela Bo, FNP  metFORMIN (GLUCOPHAGE) 500 MG tablet Take 500 mg by mouth daily with breakfast. Daily with a meal    [provider]  metoprolol succinate (TOPROL-XL) 100 MG 24 hr tablet Take 1 tablet (100 mg total) by mouth daily. Take with or immediately following a meal. 04/30/21 07/30/22  Evans Lance, MD  pantoprazole (PROTONIX) 40 MG tablet Take 40 mg by mouth daily.    [provider]  Prednisol Ace-Moxiflox-Bromfen 1-0.5-0.075 % SUSP Place 1 drop into the right eye 4 (four) times daily.  05/04/21   [provider]  rivaroxaban (XARELTO) 20 MG TABS tablet Take 1 tablet (20 mg total) by mouth daily with supper. Resume from 11/01/2019 11/01/19   Aline August, MD  spironolactone (ALDACTONE) 25 MG tablet Take 1 tablet (25 mg total) by mouth daily. 06/21/21   [place]ford, Maricela Bo, FNP  Tiotropium Bromide Monohydrate (SPIRIVA RESPIMAT) 2.5 MCG/ACT AERS Inhale 2 puffs into the lungs daily. Patient taking differently: Inhale 2 puffs into the lungs as needed. 10/23/20   Parrett, Fonnie Mu, NP  torsemide (DEMADEX) 20 MG tablet Take 20 mg by mouth daily.    [provider]    Family History Family History  Problem Relation Age of Onset   Cancer Mother  LYMPHOMA   Heart disease Father    Other Father        TB   CVA Sister    Prostate cancer Brother 56   Diabetes Brother     Social History Social History   Tobacco Use   Smoking status: Former    Packs/day: 0.50    Years: 56.00    Total pack years: 28.00    Types: Cigarettes    Quit date: 11/13/2021    Years since quitting: 0.3   Smokeless tobacco: Never   Tobacco comments:    smoking 2 cigarettes/day as of 10/23/20  Vaping Use   Vaping Use: Never used  Substance Use Topics   Alcohol use: No    Alcohol/week: 0.0 standard drinks of alcohol   Drug use: No     Allergies   Bee venom, Ivp dye [iodinated contrast media], Ace inhibitors, Atenolol, Codeine, Entresto [sacubitril-valsartan], Penicillin g, Shrimp [shellfish allergy], Simvastatin, and Jardiance [empagliflozin]   Review of Systems Review of Systems Defer to HPI   Physical Exam Triage Vital Signs ED Triage Vitals  Enc Vitals Group     BP 03/05/22 1450 (!) 152/96     Pulse Rate 03/05/22 1450 69     Resp 03/05/22 1450 (!) 22     Temp 03/05/22 1450 98.1 F (36.7 C)     Temp Source 03/05/22 1450 Oral     SpO2 03/05/22 1450 99 %     Weight --      Height --      Head Circumference --      Peak Flow --      Pain Score 03/05/22 1451 0      Pain Loc --      Pain Edu? --      Excl. in [place]? --    No data found.  Updated Vital Signs BP (!) 152/96 (BP Location: Right Arm)   Pulse 69   Temp 98.1 F (36.7 C) (Oral)   Resp (!) 22   SpO2 99%   Visual Acuity Right Eye Distance:   Left Eye Distance:   Bilateral Distance:    Right Eye Near:   Left Eye Near:    Bilateral Near:     Physical Exam Constitutional:      Appearance: Normal appearance. She is well-developed.  HENT:     Head: Normocephalic.  Eyes:     Extraocular Movements: Extraocular movements intact.  Cardiovascular:     Rate and Rhythm: Regular rhythm.     Pulses: Normal pulses.     Heart sounds: Normal heart sounds.  Pulmonary:     Breath sounds: Normal breath sounds.     Comments: Breathing appears labored Skin:    General: Skin is warm and dry.  Neurological:     Mental Status: She is alert and oriented to person, place, and time. Mental status is at baseline.  Psychiatric:        Mood and Affect: Mood normal.        Behavior: Behavior normal.      UC Treatments / Results  Labs (all labs ordered are listed, but only abnormal results are displayed) Labs Reviewed - No data to display  EKG   Radiology No results found.  Procedures Procedures (including critical care time)  Medications Ordered in UC Medications - No data to display  Initial Impression / Assessment and Plan / UC Course  I have reviewed the triage vital signs and the nursing notes.  Pertinent labs &  imaging results that were available during my care of the patient were reviewed by me and considered in my medical decision making (see chart for details).  Shortness of breath  Vital signs are stable, O2 saturation 99% on room air, breathing appears to be labored while at rest during exam however lungs are clear to auscultation, chest x-ray negative for acute findings, showing trace pleural effusions, discussed with patient, DuoNeb and methylprednisolone injection given  in office and on reevaluation patient endorses that she feels somewhat better breathing, prescribed cefdinir for bacterial coverage and prednisone 40 mg burst for outpatient support, declined cough medicine as she does not feel this is a concern at this time and is only coughing to expel mucus, recommended reevaluation in 1 week for eye primary doctor if possible given strict precautions that if symptoms do not improve or worsen she is to go to the nearest emergency department for further evaluation and management as she may need IV medications Final Clinical Impressions(s) / UC Diagnoses   Final diagnoses:  None   Discharge Instructions   None    ED Prescriptions   None    PDMP not reviewed this encounter.   Hans Eden, NP 03/05/22 1553

## 2022-03-05 NOTE — ED Triage Notes (Signed)
Patient evaluated 1 month ago for similar complaints. States she took the antibiotic prescribed, felt mildly better, then her symptoms returned. Reports SOB, productive cough, and occasional chest tightness.

## 2022-03-05 NOTE — Discharge Instructions (Signed)
On your chest x-ray today there were no signs of pneumonia or fluid buildup  We will again provide bacterial coverage, take cefdinir every morning and every evening for 7 days  Begin use of prednisone every morning for 5 days to reduce any inflammation or irritation aiding to your symptom  Please make an appointment with your primary doctor within the next week for follow-up and reevaluation of your  At any point if your breathing worsens please go to the nearest emergency department for further evaluation and management  You may attempt use of Mucinex to help thin out your secretions which will make them easier to cough up  Sure that you are getting adequate fluid intake which will also help to keep your secretions thin

## 2022-03-10 DIAGNOSIS — K219 Gastro-esophageal reflux disease without esophagitis: Secondary | ICD-10-CM | POA: Diagnosis not present

## 2022-03-10 DIAGNOSIS — E7800 Pure hypercholesterolemia, unspecified: Secondary | ICD-10-CM | POA: Diagnosis not present

## 2022-03-10 DIAGNOSIS — I5020 Unspecified systolic (congestive) heart failure: Secondary | ICD-10-CM | POA: Diagnosis not present

## 2022-03-10 DIAGNOSIS — J441 Chronic obstructive pulmonary disease with (acute) exacerbation: Secondary | ICD-10-CM | POA: Diagnosis not present

## 2022-03-10 DIAGNOSIS — E1122 Type 2 diabetes mellitus with diabetic chronic kidney disease: Secondary | ICD-10-CM | POA: Diagnosis not present

## 2022-03-10 DIAGNOSIS — I10 Essential (primary) hypertension: Secondary | ICD-10-CM | POA: Diagnosis not present

## 2022-03-10 DIAGNOSIS — N1831 Chronic kidney disease, stage 3a: Secondary | ICD-10-CM | POA: Diagnosis not present

## 2022-03-14 ENCOUNTER — Telehealth (HOSPITAL_COMMUNITY): Payer: Self-pay | Admitting: Emergency Medicine

## 2022-03-14 MED ORDER — AZITHROMYCIN 250 MG PO TABS: 250.0000 mg | ORAL_TABLET | Freq: Every day | ORAL | 0 refills | Status: DC

## 2022-03-14 NOTE — Telephone Encounter (Signed)
Received a fax from pharmacy stating they need an alternative prescription for patient's antibiotics.  Reviewed with [name] [name] who okay'd a Zpack.  Attempted to reach patient to review, unable to LVM on either number.

## 2022-03-16 ENCOUNTER — Other Ambulatory Visit: Payer: Self-pay | Admitting: Emergency Medicine

## 2022-03-16 ENCOUNTER — Ambulatory Visit (INDEPENDENT_AMBULATORY_CARE_PROVIDER_SITE_OTHER): Payer: PPO | Admitting: Internal Medicine

## 2022-03-16 ENCOUNTER — Encounter: Payer: Self-pay | Admitting: Internal Medicine

## 2022-03-16 VITALS — BP 146/86 | HR 73 | Temp 97.7°F | Resp 20 | Ht 67.0 in | Wt 193.0 lb

## 2022-03-16 DIAGNOSIS — E785 Hyperlipidemia, unspecified: Secondary | ICD-10-CM | POA: Diagnosis not present

## 2022-03-16 DIAGNOSIS — J411 Mucopurulent chronic bronchitis: Secondary | ICD-10-CM | POA: Diagnosis not present

## 2022-03-16 DIAGNOSIS — E1122 Type 2 diabetes mellitus with diabetic chronic kidney disease: Secondary | ICD-10-CM | POA: Diagnosis not present

## 2022-03-16 DIAGNOSIS — I5042 Chronic combined systolic (congestive) and diastolic (congestive) heart failure: Secondary | ICD-10-CM

## 2022-03-16 DIAGNOSIS — D696 Thrombocytopenia, unspecified: Secondary | ICD-10-CM

## 2022-03-16 DIAGNOSIS — I10 Essential (primary) hypertension: Secondary | ICD-10-CM | POA: Diagnosis not present

## 2022-03-16 DIAGNOSIS — J441 Chronic obstructive pulmonary disease with (acute) exacerbation: Secondary | ICD-10-CM

## 2022-03-16 DIAGNOSIS — N1831 Chronic kidney disease, stage 3a: Secondary | ICD-10-CM

## 2022-03-16 DIAGNOSIS — I4819 Other persistent atrial fibrillation: Secondary | ICD-10-CM

## 2022-03-16 LAB — LIPID PANEL
Cholesterol: 109 mg/dL (ref 0–200)
HDL: 30.2 mg/dL — ABNORMAL LOW (ref >39.00)
LDL Cholesterol: 54 mg/dL (ref 0–99)
NonHDL: 78.9
Total CHOL/HDL Ratio: 4
Triglycerides: 123 mg/dL (ref 0.0–149.0)
VLDL: 24.6 mg/dL (ref 0.0–40.0)

## 2022-03-16 LAB — CBC WITH DIFFERENTIAL/PLATELET
Basophils Absolute: 0.1 10*3/uL (ref 0.0–0.1)
Basophils Relative: 1 % (ref 0.0–3.0)
Eosinophils Absolute: 0 10*3/uL (ref 0.0–0.7)
Eosinophils Relative: 0.3 % (ref 0.0–5.0)
HCT: 45.2 % (ref 36.0–46.0)
Hemoglobin: 14.8 g/dL (ref 12.0–15.0)
Lymphocytes Relative: 22.5 % (ref 12.0–46.0)
Lymphs Abs: 2 10*3/uL (ref 0.7–4.0)
MCHC: 32.7 g/dL (ref 30.0–36.0)
MCV: 92.3 fl (ref 78.0–100.0)
Monocytes Absolute: 0.6 10*3/uL (ref 0.1–1.0)
Monocytes Relative: 6.5 % (ref 3.0–12.0)
Neutro Abs: 6.3 10*3/uL (ref 1.4–7.7)
Neutrophils Relative %: 69.7 % (ref 43.0–77.0)
Platelets: 110 10*3/uL — ABNORMAL LOW (ref 150.0–400.0)
RBC: 4.9 Mil/uL (ref 3.87–5.11)
RDW: 18.5 % — ABNORMAL HIGH (ref 11.5–15.5)
WBC: 9 10*3/uL (ref 4.0–10.5)

## 2022-03-16 LAB — VITAMIN B12: Vitamin B-12: 649 pg/mL (ref 211–911)

## 2022-03-16 LAB — TSH: TSH: 1.42 u[IU]/mL (ref 0.35–5.50)

## 2022-03-16 LAB — HEMOGLOBIN A1C: Hgb A1c MFr Bld: 6.2 % (ref 4.6–6.5)

## 2022-03-16 LAB — FOLATE: Folate: 11 ng/mL (ref >5.9)

## 2022-03-16 MED ORDER — TRELEGY ELLIPTA 100-62.5-25 MCG/ACT IN AEPB: 1.0000 | INHALATION_SPRAY | Freq: Every day | RESPIRATORY_TRACT | 1 refills | Status: DC

## 2022-03-16 NOTE — Patient Instructions (Signed)

## 2022-03-16 NOTE — Progress Notes (Unsigned)
Subjective:  Patient ID: [name] [name], female    DOB: April 09, 1946  Age: 76 y.o. MRN: [id number]  CC: COPD, Hypertension, Hyperlipidemia, Diabetes, and Congestive Heart Failure   HPI [name] [name] presents for establishing -  She was seen in the ED recently for worsening cough.  Her chest x-ray was negative for mass or infiltrate.  She was prescribed doxycycline but never took it.  She has been prescribed Zithromax but has not started taking it yet.  She was prescribed prednisone and has completed it.  She is not using the LAMA that was prescribed a little over a year ago.  History [name] [name] has a past medical history of (HFimpEF) heart failure with improved ejection fraction ([place]), Arthritis, Asthma, Atopic dermatitis, CAD (coronary artery disease), Cardiac arrest ([place]) (10/2014), Cardiomyopathy, ischemic, Carotid arterial disease ([place]), CHF (congestive heart failure) ([place]), CKD (chronic kidney disease), stage III ([place]), COPD (chronic obstructive pulmonary disease) ([place]), Diabetes mellitus without complication ([place]), Dysrhythmia, Esophageal dilatation (2013), GERD (gastroesophageal reflux disease), Gout, Headache, Hypertension, Hypokalemia, Idiopathic angioedema, LGI bleed (08/06/2017), Lower back pain, Paroxysmal atrial fibrillation (HCC), Personal history of noncompliance with medical treatment, presenting hazards to health, Prediabetes, Presence of permanent cardiac pacemaker, S/P CABG x 2 with clipping of LA appendage (11/14/2014), Urine incontinence, and Uterine fibroid.   She has a past surgical history that includes left heart catheterization with coronary angiogram (N/A, 11/04/2014); temporary pacemaker insertion (11/04/2014); Coronary artery bypass graft (N/A, 11/14/2014); Clipping of atrial appendage (N/A, 11/14/2014); TEE without cardioversion (N/A, 11/14/2014); Cardioversion (N/A, 11/18/2014); Cardioversion (N/A, 12/25/2017); TEE without cardioversion (N/A, 12/25/2017); Tooth extraction;  Colonoscopy (2013); Esophagogastroduodenoscopy (egd) with propofol (N/A, 10/10/2019); Balloon dilation (N/A, 10/10/2019); biopsy (10/10/2019); Colonoscopy with propofol (N/A, 10/11/2019); polypectomy (10/11/2019); biopsy (10/11/2019); Esophagogastroduodenoscopy (egd) with propofol (N/A, 10/27/2019); Givens capsule study (N/A, 10/27/2019); AV NODE ABLATION (N/A, 01/18/2021); PACEMAKER IMPLANT (N/A, 01/18/2021); PACEMAKER IMPLANT (Left); and Insert / replace / remove pacemaker.   Her family history includes CVA in her sister; Cancer in her mother; Diabetes in her brother; Heart disease in her father; Other in her father; Prostate cancer (age of onset: 92) in her brother.She reports that she quit smoking about 4 months ago. Her smoking use included cigarettes. She has a 28.00 pack-year smoking history. She has never used smokeless tobacco. She reports that she does not drink alcohol and does not use drugs.  Outpatient Medications Prior to Visit  Medication Sig Dispense Refill   acetaminophen (TYLENOL) 325 MG tablet Take 2 tablets (650 mg total) by mouth every 4 (four) hours as needed for headache or mild pain.     allopurinol (ZYLOPRIM) 100 MG tablet Take 100 mg by mouth daily.     atorvastatin (LIPITOR) 10 MG tablet Take 10 mg by mouth at bedtime.     azithromycin (ZITHROMAX) 250 MG tablet Take 1 tablet (250 mg total) by mouth daily. Take first 2 tablets together, then 1 every day until finished. 6 tablet 0   dextromethorphan-guaiFENesin (MUCINEX DM) 30-600 MG 12hr tablet Take 1 tablet by mouth 2 (two) times daily. Take 1/2 tablet every 12 hours for cough. 14 tablet 0   losartan (COZAAR) 50 MG tablet Take 1 tablet (50 mg total) by mouth daily. 30 tablet 6   meclizine (ANTIVERT) 12.5 MG tablet Take 1 tablet (12.5 mg total) by mouth every 6 (six) hours as needed for dizziness. 30 tablet 0   metFORMIN (GLUCOPHAGE) 500 MG tablet Take 500 mg by mouth daily with breakfast. Daily with a meal  metoprolol  succinate (TOPROL-XL) 100 MG 24 hr tablet Take 1 tablet (100 mg total) by mouth daily. Take with or immediately following a meal. 90 tablet 3   pantoprazole (PROTONIX) 40 MG tablet Take 40 mg by mouth daily.     Prednisol Ace-Moxiflox-Bromfen 1-0.5-0.075 % SUSP Place 1 drop into the right eye 4 (four) times daily.     rivaroxaban (XARELTO) 20 MG TABS tablet Take 1 tablet (20 mg total) by mouth daily with supper. Resume from 11/01/2019     spironolactone (ALDACTONE) 25 MG tablet Take 1 tablet (25 mg total) by mouth daily. 30 tablet 11   torsemide (DEMADEX) 20 MG tablet Take 20 mg by mouth daily.     doxycycline (VIBRAMYCIN) 100 MG capsule Take 1 capsule (100 mg total) by mouth 2 (two) times daily. 14 capsule 0   predniSONE (DELTASONE) 20 MG tablet Take 2 tablets (40 mg total) by mouth daily. 10 tablet 0   Tiotropium Bromide Monohydrate (SPIRIVA RESPIMAT) 2.5 MCG/ACT AERS Inhale 2 puffs into the lungs daily. (Patient taking differently: Inhale 2 puffs into the lungs as needed.) 1 g 5   No facility-administered medications prior to visit.    ROS Review of Systems  Constitutional:  Negative for chills, diaphoresis, fatigue and fever.  HENT: Negative.    Eyes: Negative.   Respiratory:  Positive for cough, shortness of breath and wheezing. Negative for chest tightness and stridor.   Cardiovascular:  Negative for chest pain, palpitations and leg swelling.  Gastrointestinal:  Negative for abdominal pain, constipation, diarrhea, nausea and vomiting.  Endocrine: Negative.   Genitourinary: Negative.  Negative for difficulty urinating and dysuria.  Musculoskeletal: Negative.   Skin: Negative.   Neurological:  Negative for dizziness, weakness and headaches.  Hematological:  Negative for adenopathy. Does not bruise/bleed easily.  Psychiatric/Behavioral: Negative.      Objective:  BP (!) 146/86 (BP Location: Left Arm, Patient Position: Sitting, Cuff Size: Large)   Pulse 73   Temp 97.7 F (36.5 C)  (Oral)   Resp 20   Ht '5\' 7"'$  (1.702 m)   Wt 193 lb (87.5 kg)   SpO2 97%   BMI 30.23 kg/m   Physical Exam Vitals reviewed.  Constitutional:      General: She is not in acute distress.    Appearance: She is ill-appearing. She is not toxic-appearing or diaphoretic.  HENT:     Nose: Nose normal.     Mouth/Throat:     Mouth: Mucous membranes are moist.  Eyes:     General: No scleral icterus.    Conjunctiva/sclera: Conjunctivae normal.  Cardiovascular:     Rate and Rhythm: Normal rate. Rhythm irregularly irregular.  Pulmonary:     Effort: Accessory muscle usage present. No tachypnea or respiratory distress.     Breath sounds: Examination of the right-upper field reveals rhonchi. Examination of the right-middle field reveals rhonchi. Examination of the left-middle field reveals rhonchi. Examination of the right-lower field reveals rhonchi. Examination of the left-lower field reveals rhonchi. Rhonchi present. No decreased breath sounds, wheezing or rales.  Abdominal:     General: Abdomen is flat.     Palpations: There is no mass.     Tenderness: There is no abdominal tenderness. There is no guarding.     Hernia: No hernia is present.  Musculoskeletal:        General: Normal range of motion.     Cervical back: Neck supple.     Right lower leg: No edema.  Left lower leg: No edema.  Lymphadenopathy:     Cervical: No cervical adenopathy.  Skin:    General: Skin is warm and dry.  Neurological:     General: No focal deficit present.     Mental Status: Mental status is at baseline.  Psychiatric:        Mood and Affect: Mood normal.        Behavior: Behavior normal.     Lab Results  Component Value Date   WBC 9.0 03/16/2022   HGB 14.8 03/16/2022   HCT 45.2 03/16/2022   PLT 110.0 (L) 03/16/2022   GLUCOSE 104 (H) 12/23/2021   CHOL 109 03/16/2022   TRIG 123.0 03/16/2022   HDL 30.20 (L) 03/16/2022   LDLCALC 54 03/16/2022   ALT 22 12/23/2021   AST 23 12/23/2021   NA 142  12/23/2021   K 4.1 12/23/2021   CL 114 (H) 12/23/2021   CREATININE 1.33 (H) 12/23/2021   BUN 22 12/23/2021   CO2 20 (L) 12/23/2021   TSH 1.42 03/16/2022   INR 1.6 (H) 12/08/2020   HGBA1C 6.2 03/16/2022   MICROALBUR 238.9 (H) 03/16/2022    DG Chest 2 View  Result Date: 03/05/2022 CLINICAL DATA:  cough x1 month EXAM: CHEST - 2 VIEW COMPARISON:  Chest x-ray 07/06/2021 FINDINGS: Enlarged cardiac silhouette. The heart and mediastinal contours are unchanged. Atrial appendage clip noted. Single lead left chest wall pacemaker in similar position. Aortic calcification. Descending thoracic aorta is tortuous. Lingular linear atelectasis versus scarring. No focal consolidation. No pulmonary edema. Persistent blunting of bilateral costophrenic angles. Query trace pleural effusions bilaterally. No pneumothorax. No acute osseous abnormality.  Sternotomy wires appear intact. . IMPRESSION: 1. Stable cardiomegaly. 2.  Query trace pleural effusions bilaterally. 3. Aortic Atherosclerosis (ICD10-I70.0). Electronically Signed   By: Iven Finn M.D.   On: 03/05/2022 15:16   VAS [place] ABI WITH/WO TBI  Result Date: 02/11/2022  LOWER EXTREMITY DOPPLER STUDY Patient Name:  KENNEISHA COCHRANE  Date of Exam:   02/11/2022 Medical Rec #: [id number]      Accession #:    [id number] Date of Birth: 06/01/1946      Patient Gender: F Patient Age:   22 years Exam Location:  [facility] Procedure:      VAS [place] ABI WITH/WO TBI Referring Phys: Vonna Kotyk ROBINS --------------------------------------------------------------------------------  Indications: Peripheral artery disease, and CT 12/23/21: Extensive aortoiliac              atherosclerotic. Other Factors: Patient states borderline diabetes and is trying to quit smoking                using [place]. Denies claudication, however distance is limited                by shortness of breath.  Performing Technologist: Ralene Cork RVT  Examination Guidelines: A complete evaluation  includes at minimum, Doppler waveform signals and systolic blood pressure reading at the level of bilateral brachial, anterior tibial, and posterior tibial arteries, when vessel segments are accessible. Bilateral testing is considered an integral part of a complete examination. Photoelectric Plethysmograph (PPG) waveforms and toe systolic pressure readings are included as required and additional duplex testing as needed. Limited examinations for reoccurring indications may be performed as noted.  ABI Findings: +---------+------------------+-----+----------+--------+ Right    Rt Pressure (mmHg)IndexWaveform  Comment  +---------+------------------+-----+----------+--------+ Brachial 128                                       +---------+------------------+-----+----------+--------+  PTA      74                0.58 monophasic         +---------+------------------+-----+----------+--------+ DP       74                0.58 monophasic         +---------+------------------+-----+----------+--------+ Great Toe50                0.39                    +---------+------------------+-----+----------+--------+ +---------+------------------+-----+----------+-------+ Left     Lt Pressure (mmHg)IndexWaveform  Comment +---------+------------------+-----+----------+-------+ Brachial 127                                      +---------+------------------+-----+----------+-------+ PTA      94                0.73 monophasic        +---------+------------------+-----+----------+-------+ DP       56                0.44 monophasic        +---------+------------------+-----+----------+-------+ Great Toe54                0.42                   +---------+------------------+-----+----------+-------+ +-------+-----------+-----------+------------+------------+ ABI/TBIToday's ABIToday's TBIPrevious ABIPrevious TBI +-------+-----------+-----------+------------+------------+ Right   0.58       0.39                                +-------+-----------+-----------+------------+------------+ Left   0.73       0.42                                +-------+-----------+-----------+------------+------------+  No previous ABI.  Summary: Right: Resting right ankle-brachial index indicates moderate right lower extremity arterial disease. The right toe-brachial index is abnormal. Left: Resting left ankle-brachial index indicates mild left lower extremity arterial disease. The left toe-brachial index is abnormal. *See table(s) above for measurements and observations.  Electronically signed by Orlie Pollen on 02/11/2022 at 5:38:28 PM.    Final    CUP PACEART REMOTE DEVICE CHECK  Result Date: 01/18/2022 Scheduled remote reviewed. Normal device function.  Next remote 91 days- JJB/CVRS  CT ABDOMEN PELVIS WO CONTRAST  Result Date: 12/23/2021 CLINICAL DATA:  Left lower quadrant abdominal pain EXAM: CT ABDOMEN AND PELVIS WITHOUT CONTRAST TECHNIQUE: Multidetector CT imaging of the abdomen and pelvis was performed following the standard protocol without IV contrast. RADIATION DOSE REDUCTION: This exam was performed according to the departmental dose-optimization program which includes automated exposure control, adjustment of the mA and/or kV according to patient size and/or use of iterative reconstruction technique. COMPARISON:  02/10/2020 FINDINGS: Lower chest: Visualized lung bases are clear. Mild cardiomegaly. Defibrillator lead is seen within the right ventricle, new since prior examination. Small hiatal hernia. Hepatobiliary: No focal liver abnormality is seen. No gallstones, gallbladder wall thickening, or biliary dilatation. Pancreas: Unremarkable Spleen: Unremarkable Adrenals/Urinary Tract: Adrenal glands are unremarkable. Kidneys are normal, without renal calculi, focal lesion, or hydronephrosis. Bladder is unremarkable. Stomach/Bowel: Mild sigmoid colonic diverticulosis. Stomach, small  bowel, and large bowel are otherwise unremarkable. Appendix normal.  No free intraperitoneal gas or fluid. Vascular/Lymphatic: Extensive aortoiliac atherosclerotic calcification. Prominent visceral calcification within the proximal superior mesenteric artery again noted, however, the degree of stenosis is not well assessed on this non arteriographic study. Extensive atherosclerotic calcification noted within the lower extremity arterial inflow with suspected segmental chronic thrombosis of the left common iliac artery proximally. No aortic aneurysm. No pathologic adenopathy within the abdomen and pelvis. Reproductive: Fibroid uterus containing multiple involuted, densely calcified fibroids. Soft tissue mass within the right adnexa appears stable measuring 3.8 x 6.7 cm when measured in similar fashion,, not well characterized on this examination. This was, however, previously assessed on MRI examination of 09/19/2007 and may represent an ovarian fibrothecoma or potentially a broad ligament fibroid. This demonstrates progressive interval increase in size since that examination on a multiple intervening sonogram and CT examinations. Left adnexa is unremarkable. Other: No abdominal wall hernia. Musculoskeletal: No acute bone abnormality. No lytic or blastic bone lesion. IMPRESSION: No acute intra-abdominal pathology identified. No definite radiographic explanation for the patient's reported symptoms. Stable cardiomegaly. Interval different later lead placement within the right ventricle. Peripheral vascular disease with extensive atherosclerotic calcification within the superior mesenteric artery and within the a lower extremity arterial inflow possibly resulting in hemodynamically significant stenoses in these locations. If there is clinical evidence of chronic mesenteric ischemia or lower extremity arterial insufficiency (claudication, rest ischemia), CT arteriography may be more helpful for further evaluation. Stable  6.7 cm soft tissue mass within the right adnexa, not well characterized on this examination, but enlarged from MRI examination of 09/19/2007. This may represent a primary ovarian mass, such as a fibroid the, less likely a broad ligament or exophytic uterine fibroid. Gynecologic consultation may be helpful for further management if not already done so. Multiple involuted, densely calcified uterine fibroids Mild sigmoid diverticulosis without superimposed acute inflammatory change. Aortic Atherosclerosis (ICD10-I70.0). Electronically Signed   By: Fidela Salisbury M.D.   On: 12/23/2021 21:22     Assessment & Plan:    I have discontinued Ada T. Keller's Spiriva Respimat, doxycycline, and predniSONE. I am also having her start on Trelegy Ellipta. Additionally, I am having her maintain her rivaroxaban, metFORMIN, acetaminophen, atorvastatin, metoprolol succinate, pantoprazole, meclizine, Prednisol Ace-Moxiflox-Bromfen, spironolactone, allopurinol, torsemide, losartan, dextromethorphan-guaiFENesin, and azithromycin.  Meds ordered this encounter  Medications   Fluticasone-Umeclidin-Vilant (TRELEGY ELLIPTA) 100-62.5-25 MCG/ACT AEPB    Sig: Inhale 1 puff into the lungs daily.    Dispense:  120 each    Refill:  1     Follow-up: Return in about 3 months (around 06/16/2022).  Scarlette Calico, MD

## 2022-03-17 LAB — URINALYSIS, ROUTINE W REFLEX MICROSCOPIC
Bilirubin Urine: NEGATIVE
Ketones, ur: NEGATIVE
Leukocytes,Ua: NEGATIVE
Nitrite: NEGATIVE
Specific Gravity, Urine: 1.025 (ref 1.000–1.030)
Total Protein, Urine: >=300 — AB
Urine Glucose: NEGATIVE
Urobilinogen, UA: 1 (ref 0.0–1.0)
pH: 6 (ref 5.0–8.0)

## 2022-03-17 LAB — MICROALBUMIN / CREATININE URINE RATIO
Creatinine,U: 135.9 mg/dL
Microalb Creat Ratio: 175.7 mg/g — ABNORMAL HIGH (ref 0.0–30.0)
Microalb, Ur: 238.9 mg/dL — ABNORMAL HIGH (ref 0.0–1.9)

## 2022-03-17 MED ORDER — ATORVASTATIN CALCIUM 10 MG PO TABS: 10.0000 mg | ORAL_TABLET | Freq: Every day | ORAL | 1 refills | Status: DC

## 2022-03-17 NOTE — Assessment & Plan Note (Signed)
She has good rate control 

## 2022-03-17 NOTE — Assessment & Plan Note (Signed)
She has microalbuminuria.  Will continue the ARB.

## 2022-03-17 NOTE — Assessment & Plan Note (Signed)
This is stable.  There is no history of bleeding or bruising.

## 2022-03-17 NOTE — Progress Notes (Signed)
Subjective:  Patient ID: [name] [name], female    DOB: 09-Dec-1945  Age: 76 y.o. MRN: [id number]  CC: COPD, Hypertension, Hyperlipidemia, Diabetes, and Congestive Heart Failure   HPI [name] [name] presents for establishing -  She was seen in the ED recently for worsening cough.  Her chest x-ray was negative for mass or infiltrate.  She was prescribed doxycycline but never took it.  She has been prescribed Zithromax but has not started taking it yet.  She was prescribed prednisone and has completed it.  She is not using the LAMA that was prescribed a little over a year ago.  History [name] [name] has a past medical history of (HFimpEF) heart failure with improved ejection fraction ([place]), Arthritis, Asthma, Atopic dermatitis, CAD (coronary artery disease), Cardiac arrest ([place]) (10/2014), Cardiomyopathy, ischemic, Carotid arterial disease ([place]), CHF (congestive heart failure) ([place]), CKD (chronic kidney disease), stage III ([place]), COPD (chronic obstructive pulmonary disease) ([place]), Diabetes mellitus without complication ([place]), Dysrhythmia, Esophageal dilatation (2013), GERD (gastroesophageal reflux disease), Gout, Headache, Hypertension, Hypokalemia, Idiopathic angioedema, LGI bleed (08/06/2017), Lower back pain, Paroxysmal atrial fibrillation (HCC), Personal history of noncompliance with medical treatment, presenting hazards to health, Prediabetes, Presence of permanent cardiac pacemaker, S/P CABG x 2 with clipping of LA appendage (11/14/2014), Urine incontinence, and Uterine fibroid.   She has a past surgical history that includes left heart catheterization with coronary angiogram (N/A, 11/04/2014); temporary pacemaker insertion (11/04/2014); Coronary artery bypass graft (N/A, 11/14/2014); Clipping of atrial appendage (N/A, 11/14/2014); TEE without cardioversion (N/A, 11/14/2014); Cardioversion (N/A, 11/18/2014); Cardioversion (N/A, 12/25/2017); TEE without cardioversion (N/A, 12/25/2017); Tooth extraction;  Colonoscopy (2013); Esophagogastroduodenoscopy (egd) with propofol (N/A, 10/10/2019); Balloon dilation (N/A, 10/10/2019); biopsy (10/10/2019); Colonoscopy with propofol (N/A, 10/11/2019); polypectomy (10/11/2019); biopsy (10/11/2019); Esophagogastroduodenoscopy (egd) with propofol (N/A, 10/27/2019); Givens capsule study (N/A, 10/27/2019); AV NODE ABLATION (N/A, 01/18/2021); PACEMAKER IMPLANT (N/A, 01/18/2021); PACEMAKER IMPLANT (Left); and Insert / replace / remove pacemaker.   Her family history includes CVA in her sister; Cancer in her mother; Diabetes in her brother; Heart disease in her father; Other in her father; Prostate cancer (age of onset: 1) in her brother.She reports that she quit smoking about 4 months ago. Her smoking use included cigarettes. She has a 28.00 pack-year smoking history. She has never used smokeless tobacco. She reports that she does not drink alcohol and does not use drugs.  Outpatient Medications Prior to Visit  Medication Sig Dispense Refill   acetaminophen (TYLENOL) 325 MG tablet Take 2 tablets (650 mg total) by mouth every 4 (four) hours as needed for headache or mild pain.     allopurinol (ZYLOPRIM) 100 MG tablet Take 100 mg by mouth daily.     azithromycin (ZITHROMAX) 250 MG tablet Take 1 tablet (250 mg total) by mouth daily. Take first 2 tablets together, then 1 every day until finished. 6 tablet 0   dextromethorphan-guaiFENesin (MUCINEX DM) 30-600 MG 12hr tablet Take 1 tablet by mouth 2 (two) times daily. Take 1/2 tablet every 12 hours for cough. 14 tablet 0   losartan (COZAAR) 50 MG tablet Take 1 tablet (50 mg total) by mouth daily. 30 tablet 6   metFORMIN (GLUCOPHAGE) 500 MG tablet Take 500 mg by mouth daily with breakfast. Daily with a meal     metoprolol succinate (TOPROL-XL) 100 MG 24 hr tablet Take 1 tablet (100 mg total) by mouth daily. Take with or immediately following a meal. 90 tablet 3   pantoprazole (PROTONIX) 40 MG tablet Take 40 mg by mouth daily.  Prednisol Ace-Moxiflox-Bromfen 1-0.5-0.075 % SUSP Place 1 drop into the right eye 4 (four) times daily.     rivaroxaban (XARELTO) 20 MG TABS tablet Take 1 tablet (20 mg total) by mouth daily with supper. Resume from 11/01/2019     spironolactone (ALDACTONE) 25 MG tablet Take 1 tablet (25 mg total) by mouth daily. 30 tablet 11   torsemide (DEMADEX) 20 MG tablet Take 20 mg by mouth daily.     atorvastatin (LIPITOR) 10 MG tablet Take 10 mg by mouth at bedtime.     doxycycline (VIBRAMYCIN) 100 MG capsule Take 1 capsule (100 mg total) by mouth 2 (two) times daily. 14 capsule 0   meclizine (ANTIVERT) 12.5 MG tablet Take 1 tablet (12.5 mg total) by mouth every 6 (six) hours as needed for dizziness. 30 tablet 0   predniSONE (DELTASONE) 20 MG tablet Take 2 tablets (40 mg total) by mouth daily. 10 tablet 0   Tiotropium Bromide Monohydrate (SPIRIVA RESPIMAT) 2.5 MCG/ACT AERS Inhale 2 puffs into the lungs daily. (Patient taking differently: Inhale 2 puffs into the lungs as needed.) 1 g 5   No facility-administered medications prior to visit.    ROS Review of Systems  Constitutional:  Negative for chills, diaphoresis, fatigue and fever.  HENT: Negative.    Eyes: Negative.   Respiratory:  Positive for cough, shortness of breath and wheezing. Negative for chest tightness and stridor.   Cardiovascular:  Negative for chest pain, palpitations and leg swelling.  Gastrointestinal:  Negative for abdominal pain, constipation, diarrhea, nausea and vomiting.  Endocrine: Negative.   Genitourinary: Negative.  Negative for difficulty urinating and dysuria.  Musculoskeletal: Negative.   Skin: Negative.   Neurological:  Negative for dizziness, weakness and headaches.  Hematological:  Negative for adenopathy. Does not bruise/bleed easily.  Psychiatric/Behavioral: Negative.      Objective:  BP (!) 146/86 (BP Location: Left Arm, Patient Position: Sitting, Cuff Size: Large)   Pulse 73   Temp 97.7 F (36.5 C)  (Oral)   Resp 20   Ht '5\' 7"'$  (1.702 m)   Wt 193 lb (87.5 kg)   SpO2 97%   BMI 30.23 kg/m   Physical Exam Vitals reviewed.  Constitutional:      General: She is not in acute distress.    Appearance: She is ill-appearing. She is not toxic-appearing or diaphoretic.  HENT:     Nose: Nose normal.     Mouth/Throat:     Mouth: Mucous membranes are moist.  Eyes:     General: No scleral icterus.    Conjunctiva/sclera: Conjunctivae normal.  Cardiovascular:     Rate and Rhythm: Normal rate. Rhythm irregularly irregular.  Pulmonary:     Effort: Accessory muscle usage present. No tachypnea or respiratory distress.     Breath sounds: No stridor. Examination of the right-upper field reveals rhonchi. Examination of the right-middle field reveals rhonchi. Examination of the left-middle field reveals rhonchi. Examination of the right-lower field reveals rhonchi. Examination of the left-lower field reveals rhonchi. Rhonchi present. No decreased breath sounds, wheezing or rales.  Abdominal:     General: Abdomen is flat.     Palpations: There is no mass.     Tenderness: There is no abdominal tenderness. There is no guarding.     Hernia: No hernia is present.  Musculoskeletal:        General: Normal range of motion.     Cervical back: Neck supple.     Right lower leg: No edema.  Left lower leg: No edema.  Lymphadenopathy:     Cervical: No cervical adenopathy.  Skin:    General: Skin is warm and dry.  Neurological:     General: No focal deficit present.     Mental Status: Mental status is at baseline.  Psychiatric:        Mood and Affect: Mood normal.        Behavior: Behavior normal.     Lab Results  Component Value Date   WBC 9.0 03/16/2022   HGB 14.8 03/16/2022   HCT 45.2 03/16/2022   PLT 110.0 (L) 03/16/2022   GLUCOSE 104 (H) 12/23/2021   CHOL 109 03/16/2022   TRIG 123.0 03/16/2022   HDL 30.20 (L) 03/16/2022   LDLCALC 54 03/16/2022   ALT 22 12/23/2021   AST 23 12/23/2021    NA 142 12/23/2021   K 4.1 12/23/2021   CL 114 (H) 12/23/2021   CREATININE 1.33 (H) 12/23/2021   BUN 22 12/23/2021   CO2 20 (L) 12/23/2021   TSH 1.42 03/16/2022   INR 1.6 (H) 12/08/2020   HGBA1C 6.2 03/16/2022   MICROALBUR 238.9 (H) 03/16/2022    DG Chest 2 View  Result Date: 03/05/2022 CLINICAL DATA:  cough x1 month EXAM: CHEST - 2 VIEW COMPARISON:  Chest x-ray 07/06/2021 FINDINGS: Enlarged cardiac silhouette. The heart and mediastinal contours are unchanged. Atrial appendage clip noted. Single lead left chest wall pacemaker in similar position. Aortic calcification. Descending thoracic aorta is tortuous. Lingular linear atelectasis versus scarring. No focal consolidation. No pulmonary edema. Persistent blunting of bilateral costophrenic angles. Query trace pleural effusions bilaterally. No pneumothorax. No acute osseous abnormality.  Sternotomy wires appear intact. . IMPRESSION: 1. Stable cardiomegaly. 2.  Query trace pleural effusions bilaterally. 3. Aortic Atherosclerosis (ICD10-I70.0). Electronically Signed   By: Iven Finn M.D.   On: 03/05/2022 15:16   VAS [place] ABI WITH/WO TBI  Result Date: 02/11/2022  LOWER EXTREMITY DOPPLER STUDY Patient Name:  [name] [name]  Date of Exam:   02/11/2022 Medical Rec #: [id number]      Accession #:    [id number] Date of Birth: March 23, 1946      Patient Gender: F Patient Age:   21 years Exam Location:  [facility] Procedure:      VAS [place] ABI WITH/WO TBI Referring Phys: Vonna Kotyk ROBINS --------------------------------------------------------------------------------  Indications: Peripheral artery disease, and CT 12/23/21: Extensive aortoiliac              atherosclerotic. Other Factors: Patient states borderline diabetes and is trying to quit smoking                using [place]. Denies claudication, however distance is limited                by shortness of breath.  Performing Technologist: Ralene Cork RVT  Examination Guidelines: A complete  evaluation includes at minimum, Doppler waveform signals and systolic blood pressure reading at the level of bilateral brachial, anterior tibial, and posterior tibial arteries, when vessel segments are accessible. Bilateral testing is considered an integral part of a complete examination. Photoelectric Plethysmograph (PPG) waveforms and toe systolic pressure readings are included as required and additional duplex testing as needed. Limited examinations for reoccurring indications may be performed as noted.  ABI Findings: +---------+------------------+-----+----------+--------+ Right    Rt Pressure (mmHg)IndexWaveform  Comment  +---------+------------------+-----+----------+--------+ Brachial 128                                       +---------+------------------+-----+----------+--------+  PTA      74                0.58 monophasic         +---------+------------------+-----+----------+--------+ DP       74                0.58 monophasic         +---------+------------------+-----+----------+--------+ Great Toe50                0.39                    +---------+------------------+-----+----------+--------+ +---------+------------------+-----+----------+-------+ Left     Lt Pressure (mmHg)IndexWaveform  Comment +---------+------------------+-----+----------+-------+ Brachial 127                                      +---------+------------------+-----+----------+-------+ PTA      94                0.73 monophasic        +---------+------------------+-----+----------+-------+ DP       56                0.44 monophasic        +---------+------------------+-----+----------+-------+ Great Toe54                0.42                   +---------+------------------+-----+----------+-------+ +-------+-----------+-----------+------------+------------+ ABI/TBIToday's ABIToday's TBIPrevious ABIPrevious TBI +-------+-----------+-----------+------------+------------+  Right  0.58       0.39                                +-------+-----------+-----------+------------+------------+ Left   0.73       0.42                                +-------+-----------+-----------+------------+------------+  No previous ABI.  Summary: Right: Resting right ankle-brachial index indicates moderate right lower extremity arterial disease. The right toe-brachial index is abnormal. Left: Resting left ankle-brachial index indicates mild left lower extremity arterial disease. The left toe-brachial index is abnormal. *See table(s) above for measurements and observations.  Electronically signed by Orlie Pollen on 02/11/2022 at 5:38:28 PM.    Final    CUP PACEART REMOTE DEVICE CHECK  Result Date: 01/18/2022 Scheduled remote reviewed. Normal device function.  Next remote 91 days- JJB/CVRS  CT ABDOMEN PELVIS WO CONTRAST  Result Date: 12/23/2021 CLINICAL DATA:  Left lower quadrant abdominal pain EXAM: CT ABDOMEN AND PELVIS WITHOUT CONTRAST TECHNIQUE: Multidetector CT imaging of the abdomen and pelvis was performed following the standard protocol without IV contrast. RADIATION DOSE REDUCTION: This exam was performed according to the departmental dose-optimization program which includes automated exposure control, adjustment of the mA and/or kV according to patient size and/or use of iterative reconstruction technique. COMPARISON:  02/10/2020 FINDINGS: Lower chest: Visualized lung bases are clear. Mild cardiomegaly. Defibrillator lead is seen within the right ventricle, new since prior examination. Small hiatal hernia. Hepatobiliary: No focal liver abnormality is seen. No gallstones, gallbladder wall thickening, or biliary dilatation. Pancreas: Unremarkable Spleen: Unremarkable Adrenals/Urinary Tract: Adrenal glands are unremarkable. Kidneys are normal, without renal calculi, focal lesion, or hydronephrosis. Bladder is unremarkable. Stomach/Bowel: Mild sigmoid colonic diverticulosis. Stomach,  small bowel, and large bowel are otherwise unremarkable. Appendix normal.  No free intraperitoneal gas or fluid. Vascular/Lymphatic: Extensive aortoiliac atherosclerotic calcification. Prominent visceral calcification within the proximal superior mesenteric artery again noted, however, the degree of stenosis is not well assessed on this non arteriographic study. Extensive atherosclerotic calcification noted within the lower extremity arterial inflow with suspected segmental chronic thrombosis of the left common iliac artery proximally. No aortic aneurysm. No pathologic adenopathy within the abdomen and pelvis. Reproductive: Fibroid uterus containing multiple involuted, densely calcified fibroids. Soft tissue mass within the right adnexa appears stable measuring 3.8 x 6.7 cm when measured in similar fashion,, not well characterized on this examination. This was, however, previously assessed on MRI examination of 09/19/2007 and may represent an ovarian fibrothecoma or potentially a broad ligament fibroid. This demonstrates progressive interval increase in size since that examination on a multiple intervening sonogram and CT examinations. Left adnexa is unremarkable. Other: No abdominal wall hernia. Musculoskeletal: No acute bone abnormality. No lytic or blastic bone lesion. IMPRESSION: No acute intra-abdominal pathology identified. No definite radiographic explanation for the patient's reported symptoms. Stable cardiomegaly. Interval different later lead placement within the right ventricle. Peripheral vascular disease with extensive atherosclerotic calcification within the superior mesenteric artery and within the a lower extremity arterial inflow possibly resulting in hemodynamically significant stenoses in these locations. If there is clinical evidence of chronic mesenteric ischemia or lower extremity arterial insufficiency (claudication, rest ischemia), CT arteriography may be more helpful for further evaluation.  Stable 6.7 cm soft tissue mass within the right adnexa, not well characterized on this examination, but enlarged from MRI examination of 09/19/2007. This may represent a primary ovarian mass, such as a fibroid the, less likely a broad ligament or exophytic uterine fibroid. Gynecologic consultation may be helpful for further management if not already done so. Multiple involuted, densely calcified uterine fibroids Mild sigmoid diverticulosis without superimposed acute inflammatory change. Aortic Atherosclerosis (ICD10-I70.0). Electronically Signed   By: Fidela Salisbury M.D.   On: 12/23/2021 21:22     Assessment & Plan:    I have discontinued Kynadi T. Sgro's Spiriva Respimat, meclizine, doxycycline, and predniSONE. I have also changed her atorvastatin. Additionally, I am having her start on Trelegy Ellipta. Lastly, I am having her maintain her rivaroxaban, metFORMIN, acetaminophen, metoprolol succinate, pantoprazole, Prednisol Ace-Moxiflox-Bromfen, spironolactone, allopurinol, torsemide, losartan, dextromethorphan-guaiFENesin, and azithromycin.  Meds ordered this encounter  Medications   Fluticasone-Umeclidin-Vilant (TRELEGY ELLIPTA) 100-62.5-25 MCG/ACT AEPB    Sig: Inhale 1 puff into the lungs daily.    Dispense:  120 each    Refill:  1   atorvastatin (LIPITOR) 10 MG tablet    Sig: Take 1 tablet (10 mg total) by mouth at bedtime.    Dispense:  90 tablet    Refill:  1     Follow-up: Return in about 3 months (around 06/16/2022).  Scarlette Calico, MD

## 2022-03-17 NOTE — Assessment & Plan Note (Signed)
Her volume status is normal.

## 2022-03-17 NOTE — Assessment & Plan Note (Signed)
Will upgrade to a LAMA/LABA/ICS inhaler.

## 2022-03-20 ENCOUNTER — Ambulatory Visit (HOSPITAL_COMMUNITY)
Admission: EM | Admit: 2022-03-20 | Discharge: 2022-03-20 | Disposition: A | Discharge status: Home / Self Care | Payer: HMO | Attending: Internal Medicine | Admitting: Internal Medicine

## 2022-03-20 ENCOUNTER — Encounter: Payer: Self-pay | Admitting: Internal Medicine

## 2022-03-20 ENCOUNTER — Other Ambulatory Visit: Payer: Self-pay

## 2022-03-20 ENCOUNTER — Encounter (HOSPITAL_COMMUNITY): Payer: Self-pay | Admitting: *Deleted

## 2022-03-20 DIAGNOSIS — R0602 Shortness of breath: Secondary | ICD-10-CM

## 2022-03-20 DIAGNOSIS — J441 Chronic obstructive pulmonary disease with (acute) exacerbation: Secondary | ICD-10-CM | POA: Diagnosis not present

## 2022-03-20 MED ORDER — METHYLPREDNISOLONE SODIUM SUCC 125 MG IJ SOLR
INTRAMUSCULAR | Status: AC
  Filled 2022-03-20: qty 2

## 2022-03-20 MED ORDER — IPRATROPIUM-ALBUTEROL 0.5-2.5 (3) MG/3ML IN SOLN: 3.0000 mL | Freq: Four times a day (QID) | RESPIRATORY_TRACT | 1 refills | Status: DC | PRN

## 2022-03-20 MED ORDER — IPRATROPIUM-ALBUTEROL 0.5-2.5 (3) MG/3ML IN SOLN
3.0000 mL | Freq: Once | RESPIRATORY_TRACT | Status: AC
  Administered 2022-03-20: 3 mL via RESPIRATORY_TRACT

## 2022-03-20 MED ORDER — ALBUTEROL SULFATE HFA 108 (90 BASE) MCG/ACT IN AERS: 1.0000 | INHALATION_SPRAY | Freq: Four times a day (QID) | RESPIRATORY_TRACT | 2 refills | Status: DC | PRN

## 2022-03-20 MED ORDER — METHYLPREDNISOLONE SODIUM SUCC 125 MG IJ SOLR
60.0000 mg | Freq: Once | INTRAMUSCULAR | Status: AC
  Administered 2022-03-20: 60 mg via INTRAMUSCULAR

## 2022-03-20 MED ORDER — IPRATROPIUM-ALBUTEROL 0.5-2.5 (3) MG/3ML IN SOLN
RESPIRATORY_TRACT | Status: AC
  Filled 2022-03-20: qty 3

## 2022-03-20 NOTE — Discharge Instructions (Addendum)
Please follow-up with the pulmonary specialist.  I have sent in nebulizer medicine for you to use at home in the machine provided every 4-6 hours as needed. You can use the albuterol inhaler every 6 hours instead of the duoneb, as needed.  You can return to the urgent care as needed for symptoms. Any worsening symptoms, please go to the emergency department.

## 2022-03-20 NOTE — ED Triage Notes (Signed)
Pt reports inhaler had not helped her [facility] and wants a neb treatment.

## 2022-03-20 NOTE — ED Provider Notes (Signed)
[place]    CSN: [id number] Arrival date & time: 03/20/22  1005      History   Chief Complaint Chief Complaint  Patient presents with   Shortness of Breath    HPI [name] [name] is a 76 y.o. female.   Patient presents urgent care for evaluation of ongoing shortness of breath over the last 6 weeks and states that her current inhaler is not helping her breathing.  Patient has been using her Spiriva inhaler and states that she continues to feel short of breath.  She is requesting a nebulizer treatment today and states that this usually helps with her breathing.  She has a significant past medical history of COPD, CKD stage III, diabetes type 2, hypertension, coronary artery disease, and congestive heart failure.  Patient is a current every day cigarette smoker.  Denies chest pain, headache, fever/chills, abdominal pain, urinary symptoms, back pain, sore throat/URI symptoms, and dizziness.  Patient has a cough at baseline but states that her cough is "normal" for her.  She does not currently have an albuterol rescue inhaler or nebulizer machine at home.  Patient states that she was recently prescribed a course of steroids for COPD exacerbation and has finished this.   Shortness of Breath   Past Medical History:  Diagnosis Date   (HFimpEF) heart failure with improved ejection fraction ([place])    a. 07/2018 Echo: EF 30-35%; b. 11/2019 Echo: EF 55-60%, no rwma, Gr2 DD, Nl RV size/fxn. Mild BAE. Mild MR/AI.   Arthritis    Asthma    Atopic dermatitis    CAD (coronary artery disease)    a. 2016 s/p CABG x 2 (LIMA->LAD, VG->OM); b. 08/2018 MV: EF 44%, no ischemia/infact.   Cardiac arrest ([place]) 10/2014   Cardiomyopathy, ischemic    a. 07/2018 Echo: EF 30-35%; 11/2019 Echo: EF 55-60%.   Carotid arterial disease ([place])    a. 11/2019 Carotid U/S   CHF (congestive heart failure) (HCC)    CKD (chronic kidney disease), stage III (HCC)    COPD (chronic obstructive pulmonary disease) (HCC)     Diabetes mellitus without complication ([place])    Dysrhythmia    Esophageal dilatation 2013   GERD (gastroesophageal reflux disease)    Gout    Headache    Hypertension    Hypokalemia    Idiopathic angioedema    LGI bleed 08/06/2017   a. felt to be hemorrhoidal during that admission (no drop in Hgb).   Lower back pain    Paroxysmal atrial fibrillation (HCC)    a. Dx 2016-->h/o difficult to control rates (complicated by noncompliance), not felt to be a candidate for ablation or antiarrhythmic due to noncompliance; b. Recurrent AF 2021 - converted w/ IV dilt; c. CHA2DS2VASc = 7-->Xarelto.   Personal history of noncompliance with medical treatment, presenting hazards to health    Prediabetes    Presence of permanent cardiac pacemaker    S/P CABG x 2 with clipping of LA appendage 11/14/2014   LIMA to LAD, SVG to OM, EVH via right thigh   Urine incontinence    Uterine fibroid     Patient Active Problem List   Diagnosis Date Noted   Pacemaker 02/22/2022   Obstructive sleep apnea 09/21/2020   Tobacco dependence 02/10/2020   Obesity (BMI 30.0-34.9) 02/10/2020   COPD (chronic obstructive pulmonary disease) ([place]) 10/26/2019   Thrombocytopenia ([place]) 09/14/2017   Medication noncompliance due to cognitive impairment 09/06/2017   Persistent atrial fibrillation ([place]) 08/04/2017   Paroxysmal  atrial fibrillation ([place]) 02/08/2017   Cardiomyopathy, ischemic 02/08/2017   CKD (chronic kidney disease), stage III ([place]) 01/30/2017   S/P CABG x 2 11/14/2014   Coronary artery disease due to lipid rich plaque    Essential hypertension    Chronic combined systolic and diastolic CHF (congestive heart failure) ([place]) 10/30/2014    Past Surgical History:  Procedure Laterality Date   AV NODE ABLATION N/A 01/18/2021   Procedure: AV NODE ABLATION;  Surgeon: Evans Lance, MD;  Location: [facility];  Service: Cardiovascular;  Laterality: N/A;   BALLOON DILATION N/A 10/10/2019   Procedure:  BALLOON DILATION;  Surgeon: Otis Brace, MD;  Location: [facility];  Service: Gastroenterology;  Laterality: N/A;   BIOPSY  10/10/2019   Procedure: BIOPSY;  Surgeon: Otis Brace, MD;  Location: [facility];  Service: Gastroenterology;;   BIOPSY  10/11/2019   Procedure: BIOPSY;  Surgeon: Otis Brace, MD;  Location: [facility];  Service: Gastroenterology;;   CARDIOVERSION N/A 11/18/2014   Procedure: CARDIOVERSION;  Surgeon: [name], MD;  Location: [facility];  Service: Cardiovascular;  Laterality: N/A;   CARDIOVERSION N/A 12/25/2017   Procedure: CARDIOVERSION;  Surgeon: [name], MD;  Location: [facility];  Service: Cardiovascular;  Laterality: N/A;   CLIPPING OF ATRIAL APPENDAGE N/A 11/14/2014   Procedure: CLIPPING OF ATRIAL APPENDAGE;  Surgeon: [name], MD;  Location: [facility];  Service: Open Heart Surgery;  Laterality: N/A;   COLONOSCOPY  2013   COLONOSCOPY WITH PROPOFOL N/A 10/11/2019   Procedure: COLONOSCOPY WITH PROPOFOL;  Surgeon: Otis Brace, MD;  Location: [facility];  Service: Gastroenterology;  Laterality: N/A;   CORONARY ARTERY BYPASS GRAFT N/A 11/14/2014   Procedure: CORONARY ARTERY BYPASS GRAFTING (CABG)TIMES 2 USING LEFT INTERNAL MAMMARY ARTERY AND RIGHT SAPHENOUS VEIN HARVESTED ENDOSCOPICALLY;  Surgeon: [name], MD;  Location: [facility] [facility];  Service: Open Heart Surgery;  Laterality: N/A;   ESOPHAGOGASTRODUODENOSCOPY (EGD) WITH PROPOFOL N/A 10/10/2019   Procedure: ESOPHAGOGASTRODUODENOSCOPY (EGD) WITH PROPOFOL;  Surgeon: Otis Brace, MD;  Location: [facility];  Service: Gastroenterology;  Laterality: N/A;   ESOPHAGOGASTRODUODENOSCOPY (EGD) WITH PROPOFOL N/A 10/27/2019   Procedure: ESOPHAGOGASTRODUODENOSCOPY (EGD) WITH PROPOFOL;  Surgeon: Ronnette Juniper, MD;  Location: [facility];  Service: Gastroenterology;  Laterality: N/A;   GIVENS CAPSULE STUDY N/A 10/27/2019   Procedure: GIVENS CAPSULE STUDY;  Surgeon: Ronnette Juniper, MD;   Location: [facility];  Service: Gastroenterology;  Laterality: N/A;   INSERT / REPLACE / REMOVE PACEMAKER     LEFT HEART CATHETERIZATION WITH CORONARY ANGIOGRAM N/A 11/04/2014   Procedure: LEFT HEART CATHETERIZATION WITH CORONARY ANGIOGRAM;  Surgeon: Troy Sine, MD;  Location: [facility] CATH LAB;  Service: Cardiovascular;  Laterality: N/A;   PACEMAKER IMPLANT N/A 01/18/2021   Procedure: PACEMAKER IMPLANT;  Surgeon: Evans Lance, MD;  Location: [facility];  Service: Cardiovascular;  Laterality: N/A;   PACEMAKER IMPLANT Left    POLYPECTOMY  10/11/2019   Procedure: POLYPECTOMY;  Surgeon: Otis Brace, MD;  Location: [facility];  Service: Gastroenterology;;   TEE WITHOUT CARDIOVERSION N/A 11/14/2014   Procedure: TRANSESOPHAGEAL ECHOCARDIOGRAM (TEE);  Surgeon: [name], MD;  Location: [facility];  Service: Open Heart Surgery;  Laterality: N/A;   TEE WITHOUT CARDIOVERSION N/A 12/25/2017   Procedure: TRANSESOPHAGEAL ECHOCARDIOGRAM (TEE);  Surgeon: [name], MD;  Location: [facility] ENDOSCOPY;  Service: Cardiovascular;  Laterality: N/A;   TEMPORARY PACEMAKER INSERTION  11/04/2014   Procedure: TEMPORARY PACEMAKER INSERTION;  Surgeon: Troy Sine, MD;  Location: [facility]  CATH LAB;  Service: Cardiovascular;;   TOOTH EXTRACTION      OB History   No obstetric history on file.      Home Medications    Prior to Admission medications   Medication Sig Start Date End Date Taking? Authorizing Provider  albuterol (VENTOLIN HFA) 108 (90 Base) MCG/ACT inhaler Inhale 1-2 puffs into the lungs every 6 (six) hours as needed for wheezing or shortness of breath. 03/20/22  Yes Rising, Wells Guiles, PA-C  ipratropium-albuterol (DUONEB) 0.5-2.5 (3) MG/3ML SOLN Take 3 mLs by nebulization every 6 (six) hours as needed. 03/20/22  Yes Rising, Wells Guiles, PA-C  acetaminophen (TYLENOL) 325 MG tablet Take 2 tablets (650 mg total) by mouth every 4 (four) hours as needed for headache or mild pain. 12/09/20   Mikhail,  Velta Addison, DO  allopurinol (ZYLOPRIM) 100 MG tablet Take 100 mg by mouth daily. 07/19/21   [provider]  atorvastatin (LIPITOR) 10 MG tablet Take 1 tablet (10 mg total) by mouth at bedtime. 03/17/22   Janith Lima, MD  azithromycin (ZITHROMAX) 250 MG tablet Take 1 tablet (250 mg total) by mouth daily. Take first 2 tablets together, then 1 every day until finished. 03/14/22   White, Leitha Schuller, NP  dextromethorphan-guaiFENesin (MUCINEX DM) 30-600 MG 12hr tablet Take 1 tablet by mouth 2 (two) times daily. Take 1/2 tablet every 12 hours for cough. 02/05/22   Nyoka Lint, PA-C  Fluticasone-Umeclidin-Vilant (TRELEGY ELLIPTA) 100-62.5-25 MCG/ACT AEPB Inhale 1 puff into the lungs daily. 03/16/22   Janith Lima, MD  losartan (COZAAR) 50 MG tablet Take 1 tablet (50 mg total) by mouth daily. 12/22/21   Bensimhon, Shaune Pascal, MD  metFORMIN (GLUCOPHAGE) 500 MG tablet Take 500 mg by mouth daily with breakfast. Daily with a meal    [provider]  metoprolol succinate (TOPROL-XL) 100 MG 24 hr tablet Take 1 tablet (100 mg total) by mouth daily. Take with or immediately following a meal. 04/30/21 07/30/22  Evans Lance, MD  pantoprazole (PROTONIX) 40 MG tablet Take 40 mg by mouth daily.    [provider]  Prednisol Ace-Moxiflox-Bromfen 1-0.5-0.075 % SUSP Place 1 drop into the right eye 4 (four) times daily. 05/04/21   [provider]  rivaroxaban (XARELTO) 20 MG TABS tablet Take 1 tablet (20 mg total) by mouth daily with supper. Resume from 11/01/2019 11/01/19   Aline August, MD  spironolactone (ALDACTONE) 25 MG tablet Take 1 tablet (25 mg total) by mouth daily. 06/21/21   Rafael Bihari, FNP  torsemide (DEMADEX) 20 MG tablet Take 20 mg by mouth daily.    [provider]    Family History Family History  Problem Relation Age of Onset   Cancer Mother        LYMPHOMA   Heart disease Father    Other Father        TB   CVA Sister    Prostate cancer Brother 57    Diabetes Brother     Social History Social History   Tobacco Use   Smoking status: Former    Packs/day: 0.50    Years: 56.00    Total pack years: 28.00    Types: Cigarettes    Quit date: 11/13/2021    Years since quitting: 0.3   Smokeless tobacco: Never   Tobacco comments:    smoking 2 cigarettes/day as of 10/23/20  Vaping Use   Vaping Use: Never used  Substance Use Topics   Alcohol use: No    Alcohol/week:  0.0 standard drinks of alcohol   Drug use: No     Allergies   Bee venom, Ivp dye [iodinated contrast media], Ace inhibitors, Atenolol, Codeine, Entresto [sacubitril-valsartan], Penicillin g, Shrimp [shellfish allergy], Simvastatin, and Jardiance [empagliflozin]   Review of Systems Review of Systems  Respiratory:  Positive for shortness of breath.    Per HPI  Physical Exam Triage Vital Signs ED Triage Vitals  Enc Vitals Group     BP 03/20/22 1035 (!) 154/101     Pulse Rate 03/20/22 1035 95     Resp 03/20/22 1035 (!) 22     Temp 03/20/22 1035 97.9 F (36.6 C)     Temp src --      SpO2 03/20/22 1035 94 %     Weight --      Height --      Head Circumference --      Peak Flow --      Pain Score 03/20/22 1034 0     Pain Loc --      Pain Edu? --      Excl. in [place]? --    No data found.  Updated Vital Signs BP (!) 154/101   Pulse 95   Temp 97.9 F (36.6 C)   Resp (!) 22   SpO2 94%   Visual Acuity Right Eye Distance:   Left Eye Distance:   Bilateral Distance:    Right Eye Near:   Left Eye Near:    Bilateral Near:     Physical Exam Vitals and nursing note reviewed.  Constitutional:      Appearance: Normal appearance. She is not ill-appearing or toxic-appearing.     Comments: Very pleasant patient sitting on exam in position of comfort table in no acute distress.   HENT:     Head: Normocephalic and atraumatic.     Right Ear: Hearing and external ear normal.     Left Ear: Hearing and external ear normal.     Nose: Nose normal.     Mouth/Throat:      Lips: Pink.     Mouth: Mucous membranes are moist.  Eyes:     General: Lids are normal. Vision grossly intact. Gaze aligned appropriately.     Extraocular Movements: Extraocular movements intact.     Conjunctiva/sclera: Conjunctivae normal.     Pupils: Pupils are equal, round, and reactive to light.  Cardiovascular:     Rate and Rhythm: Normal rate. Rhythm irregularly irregular.     Heart sounds: Normal heart sounds.  Pulmonary:     Effort: Pulmonary effort is normal. No accessory muscle usage or respiratory distress.     Breath sounds: Normal air entry. Examination of the right-upper field reveals decreased breath sounds. Examination of the left-upper field reveals decreased breath sounds. Examination of the right-middle field reveals decreased breath sounds. Examination of the left-middle field reveals decreased breath sounds. Decreased breath sounds present. No wheezing, rhonchi or rales.     Comments: Able to ambulate to exam room without significant shortness of breath.  Speaks in full sentences without difficulty. Abdominal:     Palpations: Abdomen is soft.  Musculoskeletal:     Cervical back: Neck supple.  Skin:    General: Skin is warm and dry.     Capillary Refill: Capillary refill takes less than 2 seconds.     Findings: No rash.  Neurological:     General: No focal deficit present.     Mental Status: She is alert and oriented to  person, place, and time. Mental status is at baseline.     Cranial Nerves: No dysarthria or facial asymmetry.     Gait: Gait is intact.  Psychiatric:        Mood and Affect: Mood normal.        Speech: Speech normal.        Behavior: Behavior normal.        Thought Content: Thought content normal.        Judgment: Judgment normal.      UC Treatments / Results  Labs (all labs ordered are listed, but only abnormal results are displayed) Labs Reviewed - No data to display  EKG   Radiology No results found.  Procedures Procedures  (including critical care time)  Medications Ordered in UC Medications  methylPREDNISolone sodium succinate (SOLU-MEDROL) 125 mg/2 mL injection 60 mg (60 mg Intramuscular Given 03/20/22 1110)  ipratropium-albuterol (DUONEB) 0.5-2.5 (3) MG/3ML nebulizer solution 3 mL (3 mLs Nebulization Given 03/20/22 1111)    Initial Impression / Assessment and Plan / UC Course  I have reviewed the triage vital signs and the nursing notes.  Pertinent labs & imaging results that were available during my care of the patient were reviewed by me and considered in my medical decision making (see chart for details).  1.  COPD exacerbation shortness of breath Lung sounds significantly decreased to the bilateral upper lung fields upon initial assessment.  Patient given 60 mg Solu-Medrol injection IM as well as DuoNeb treatment in the clinic with significant improvement of lung sounds upon reassessment.  Patient verbalizes improvement in shortness of breath symptoms.  No clinical indication for imaging today as patient recently had a stable chest x-ray on March 05, 2022.  Low suspicion for pleural effusion or pneumonia.  Patient is clinically well-appearing and there is no indication for further emergent evaluation at this time based on stable cardiopulmonary exam and vital signs.  She does not appear to be in any acute respiratory distress today.  Upon further review, patient was seen by primary care 4 days ago on Wednesday, March 16, 2022 and instructed to change her daily inhaler to Trelegy.  Patient continues to use Spiriva inhaler and advised to pick up Trelegy inhaler from the pharmacy to begin using this instead for management of her COPD.  A formal referral to  pulmonary was placed by PCP at time of visit 4 days ago.  Patient given walking referral to [facility] pulmonary today and advised to give them a phone call to schedule an appointment for soon as possible for follow-up regarding frequent COPD exacerbations and  shortness of breath.   Patient provided a nebulizer machine in the clinic and prescribed DuoNeb solution to use inhaler at home every 4-6 hours as needed for shortness of breath.  Also prescribed albuterol inhaler for as needed use instead of the DuoNeb breathing treatment every 4-6 hours at home.  Strict ER return precautions discussed with patient understanding and agreement.    Discussed physical exam and available lab work findings in clinic with patient.  Counseled patient regarding appropriate use of medications and potential side effects for all medications recommended or prescribed today. Discussed red flag signs and symptoms of worsening condition,when to call the PCP office, return to urgent care, and when to seek higher level of care in the emergency department. Patient verbalizes understanding and agreement with plan. All questions answered. Patient discharged in stable condition.  Final Clinical Impressions(s) / UC Diagnoses   Final diagnoses:  COPD  exacerbation ([place])  SOB (shortness of breath)     Discharge Instructions      Please follow-up with the pulmonary specialist.  I have sent in nebulizer medicine for you to use at home in the machine provided every 4-6 hours as needed. You can use the albuterol inhaler as well every 6 hours, as needed.  You can return to the urgent care as needed for symptoms. Any worsening symptoms, please go to the emergency department.      ED Prescriptions     Medication Sig Dispense Auth. Provider   ipratropium-albuterol (DUONEB) 0.5-2.5 (3) MG/3ML SOLN Take 3 mLs by nebulization every 6 (six) hours as needed. 360 mL Rising, Rebecca, PA-C   albuterol (VENTOLIN HFA) 108 (90 Base) MCG/ACT inhaler Inhale 1-2 puffs into the lungs every 6 (six) hours as needed for wheezing or shortness of breath. 18 g Rising, Wells Guiles, PA-C      PDMP not reviewed this encounter.   Talbot Grumbling,  03/22/22 2146

## 2022-03-20 NOTE — ED Notes (Signed)
Neb machine given to Pt at time of DC

## 2022-03-21 DIAGNOSIS — Z7984 Long term (current) use of oral hypoglycemic drugs: Secondary | ICD-10-CM | POA: Diagnosis not present

## 2022-03-21 DIAGNOSIS — E119 Type 2 diabetes mellitus without complications: Secondary | ICD-10-CM | POA: Diagnosis not present

## 2022-03-21 DIAGNOSIS — J449 Chronic obstructive pulmonary disease, unspecified: Secondary | ICD-10-CM | POA: Diagnosis not present

## 2022-04-04 ENCOUNTER — Institutional Professional Consult (permissible substitution): Payer: PPO | Admitting: Internal Medicine

## 2022-04-04 ENCOUNTER — Ambulatory Visit: Payer: PPO | Admitting: Nurse Practitioner

## 2022-04-14 ENCOUNTER — Ambulatory Visit (HOSPITAL_COMMUNITY)
Admission: EM | Admit: 2022-04-14 | Discharge: 2022-04-14 | Disposition: A | Discharge status: Home / Self Care | Payer: HMO | Attending: Family Medicine | Admitting: Family Medicine

## 2022-04-14 ENCOUNTER — Encounter (HOSPITAL_COMMUNITY): Payer: Self-pay | Admitting: Emergency Medicine

## 2022-04-14 DIAGNOSIS — R1310 Dysphagia, unspecified: Secondary | ICD-10-CM

## 2022-04-14 MED ORDER — PANTOPRAZOLE SODIUM 40 MG PO TBEC: 40.0000 mg | DELAYED_RELEASE_TABLET | Freq: Every day | ORAL | 2 refills | Status: DC

## 2022-04-14 NOTE — ED Triage Notes (Signed)
Patient states that she is having pain while swallowing eating certain foods.  This has been going on x 2 days.  Patient has taken Zantac.

## 2022-04-14 NOTE — ED Provider Notes (Signed)
[place]   [id number] 04/14/22 Arrival Time: 8341  ASSESSMENT & PLAN:  1. Painful swallowing    Tolerating fluids without difficulty. Suspect esophageal stricture; reported h/o. Begin: Meds ordered this encounter  Medications   pantoprazole (PROTONIX) 40 MG tablet    Sig: Take 1 tablet (40 mg total) by mouth daily.    Dispense:  30 tablet    Refill:  2   Orders Placed This Encounter  Procedures   Ambulatory referral to Gastroenterology    Referral Priority:   Routine    Referral Type:   Consultation    Referral Reason:   Specialty Services Required    Number of Visits Requested:   1   ED if abrupt worsening or develops inability to swallow fluids easily.  Reviewed expectations re: course of current medical issues. Questions answered. Outlined signs and symptoms indicating need for more acute intervention. Patient verbalized understanding. After Visit Summary given.   SUBJECTIVE: History from: patient. [name] [name] is a 76 y.o. female who presents with complaint of painful swallowing; x 24-48 hours but feels like this has been building up for a few weeks. H/O esophageal stricture; "stretched a few years ago". Afebrile. No recent illnesses. Zantac without change in symptoms. Weight stable. No abd/back pain.  No LMP recorded. Patient is postmenopausal.  Past Surgical History:  Procedure Laterality Date   AV NODE ABLATION N/A 01/18/2021   Procedure: AV NODE ABLATION;  Surgeon: Evans Lance, MD;  Location: [place] [facility];  Service: Cardiovascular;  Laterality: N/A;   BALLOON DILATION N/A 10/10/2019   Procedure: BALLOON DILATION;  Surgeon: Otis Brace, MD;  Location: [facility];  Service: Gastroenterology;  Laterality: N/A;   BIOPSY  10/10/2019   Procedure: BIOPSY;  Surgeon: Otis Brace, MD;  Location: [place];  Service: Gastroenterology;;   BIOPSY  10/11/2019   Procedure: BIOPSY;  Surgeon: Otis Brace, MD;  Location: [place];  Service: Gastroenterology;;   CARDIOVERSION N/A 11/18/2014   Procedure: CARDIOVERSION;  Surgeon: [name], MD;  Location: [place];  Service: Cardiovascular;  Laterality: N/A;   CARDIOVERSION N/A 12/25/2017   Procedure: CARDIOVERSION;  Surgeon: [name], MD;  Location: [place];  Service: Cardiovascular;  Laterality: N/A;   CLIPPING OF ATRIAL APPENDAGE N/A 11/14/2014   Procedure: CLIPPING OF ATRIAL APPENDAGE;  Surgeon: [name], MD;  Location: [place];  Service: Open Heart Surgery;  Laterality: N/A;   COLONOSCOPY  2013   COLONOSCOPY WITH PROPOFOL N/A 10/11/2019   Procedure: COLONOSCOPY WITH PROPOFOL;  Surgeon: Otis Brace, MD;  Location: [place];  Service: Gastroenterology;  Laterality: N/A;   CORONARY ARTERY BYPASS GRAFT N/A 11/14/2014   Procedure: CORONARY ARTERY BYPASS GRAFTING (CABG)TIMES 2 USING LEFT INTERNAL MAMMARY ARTERY AND RIGHT SAPHENOUS VEIN HARVESTED ENDOSCOPICALLY;  Surgeon: [name], MD;  Location: [place];  Service: Open Heart Surgery;  Laterality: N/A;   ESOPHAGOGASTRODUODENOSCOPY (EGD) WITH PROPOFOL N/A 10/10/2019   Procedure: ESOPHAGOGASTRODUODENOSCOPY (EGD) WITH PROPOFOL;  Surgeon: Otis Brace, MD;  Location: [place];  Service: Gastroenterology;  Laterality: N/A;   ESOPHAGOGASTRODUODENOSCOPY (EGD) WITH PROPOFOL N/A 10/27/2019   Procedure: ESOPHAGOGASTRODUODENOSCOPY (EGD) WITH PROPOFOL;  Surgeon: Ronnette Juniper, MD;  Location: [place];  Service: Gastroenterology;  Laterality: N/A;   GIVENS CAPSULE STUDY N/A 10/27/2019   Procedure: GIVENS CAPSULE STUDY;  Surgeon: Ronnette Juniper, MD;  Location: [place];  Service: Gastroenterology;  Laterality: N/A;   INSERT / REPLACE / REMOVE PACEMAKER     LEFT HEART CATHETERIZATION WITH  CORONARY ANGIOGRAM N/A 11/04/2014   Procedure: LEFT HEART CATHETERIZATION WITH CORONARY ANGIOGRAM;  Surgeon: Troy Sine, MD;  Location: [facility];  Service: Cardiovascular;  Laterality: N/A;    PACEMAKER IMPLANT N/A 01/18/2021   Procedure: PACEMAKER IMPLANT;  Surgeon: Evans Lance, MD;  Location: [place] [facility];  Service: Cardiovascular;  Laterality: N/A;   PACEMAKER IMPLANT Left    POLYPECTOMY  10/11/2019   Procedure: POLYPECTOMY;  Surgeon: Otis Brace, MD;  Location: [place] [facility];  Service: Gastroenterology;;   TEE WITHOUT CARDIOVERSION N/A 11/14/2014   Procedure: TRANSESOPHAGEAL ECHOCARDIOGRAM (TEE);  Surgeon: [name], MD;  Location: [place];  Service: Open Heart Surgery;  Laterality: N/A;   TEE WITHOUT CARDIOVERSION N/A 12/25/2017   Procedure: TRANSESOPHAGEAL ECHOCARDIOGRAM (TEE);  Surgeon: [name], MD;  Location: [facility];  Service: Cardiovascular;  Laterality: N/A;   TEMPORARY PACEMAKER INSERTION  11/04/2014   Procedure: TEMPORARY PACEMAKER INSERTION;  Surgeon: Troy Sine, MD;  Location: [facility];  Service: Cardiovascular;;   TOOTH EXTRACTION       OBJECTIVE:  Vitals:   04/14/22 1127 04/14/22 1128 04/14/22 1130  BP: 128/86    Pulse: 79    Resp: 18    Temp: 98.3 F (36.8 C)    TempSrc: Oral    SpO2: 97%    Weight:  86.2 kg 85.7 kg  Height:  '5\' 7"'$  (1.702 m)     General appearance: alert; no distress Oropharynx: moist; throat appears normal Lungs: unlabored Heart: regular Abdomen: soft; non-distended; no significant abdominal tenderness Back: no CVA tenderness Extremities: no edema; symmetrical with no gross deformities Skin: warm; dry Neurologic: normal gait Psychological: alert and cooperative; normal mood and affect   Allergies  Allergen Reactions   Bee Venom Anaphylaxis   Ivp Dye [Iodinated Contrast Media] Anaphylaxis   Ace Inhibitors     Other reaction(s): angioedema   Atenolol     Other reaction(s): severe headaches   Codeine Nausea And Vomiting   Entresto [Sacubitril-Valsartan] Other (See Comments)    Chest pain    Penicillin G     Other reaction(s): rash   Shrimp [Shellfish Allergy] Swelling    Simvastatin     Other reaction(s): not sure   Jardiance [Empagliflozin] Other (See Comments)    Caused Boils                                                Past Medical History:  Diagnosis Date   (HFimpEF) heart failure with improved ejection fraction ([place])    a. 07/2018 Echo: EF 30-35%; b. 11/2019 Echo: EF 55-60%, no rwma, Gr2 DD, Nl RV size/fxn. Mild BAE. Mild MR/AI.   Arthritis    Asthma    Atopic dermatitis    CAD (coronary artery disease)    a. 2016 s/p CABG x 2 (LIMA->LAD, VG->OM); b. 08/2018 MV: EF 44%, no ischemia/infact.   Cardiac arrest ([place]) 10/2014   Cardiomyopathy, ischemic    a. 07/2018 Echo: EF 30-35%; 11/2019 Echo: EF 55-60%.   Carotid arterial disease ([place])    a. 11/2019 Carotid U/S   CHF (congestive heart failure) (HCC)    CKD (chronic kidney disease), stage III (HCC)    COPD (chronic obstructive pulmonary disease) (HCC)    Diabetes mellitus without complication ([place])    Dysrhythmia    Esophageal dilatation 2013   GERD (  gastroesophageal reflux disease)    Gout    Headache    Hypertension    Hypokalemia    Idiopathic angioedema    LGI bleed 08/06/2017   a. felt to be hemorrhoidal during that admission (no drop in Hgb).   Lower back pain    Paroxysmal atrial fibrillation (HCC)    a. Dx 2016-->h/o difficult to control rates (complicated by noncompliance), not felt to be a candidate for ablation or antiarrhythmic due to noncompliance; b. Recurrent AF 2021 - converted w/ IV dilt; c. CHA2DS2VASc = 7-->Xarelto.   Personal history of noncompliance with medical treatment, presenting hazards to health    Prediabetes    Presence of permanent cardiac pacemaker    S/P CABG x 2 with clipping of LA appendage 11/14/2014   LIMA to LAD, SVG to OM, EVH via right thigh   Urine incontinence    Uterine fibroid    Social History   Socioeconomic History   Marital status: Widowed    Spouse name: Not on file   Number of children: 1   Years of education: 30   Highest education  level: Not on file  Occupational History   Occupation: RETIRED [employer]  Tobacco Use   Smoking status: Former    Packs/day: 0.50    Years: 56.00    Total pack years: 28.00    Types: Cigarettes    Quit date: 11/13/2021    Years since quitting: 0.4   Smokeless tobacco: Never   Tobacco comments:    smoking 2 cigarettes/day as of 10/23/20  Vaping Use   Vaping Use: Never used  Substance and Sexual Activity   Alcohol use: No    Alcohol/week: 0.0 standard drinks of alcohol   Drug use: No   Sexual activity: Not on file  Other Topics Concern   Not on file  Social History Narrative   Patient reports it being difficult to pay for everything due to outstanding medical bills from hospital stay last year but states she is able to keep up with basic expenses.  Patient does have some concerns with her house's condition due to a water leak- had her roof repaired last month but has some leaking in the front which has affected her porch.  Patient owns her own car and is able to drive herself but has some concerns about the reliability of her car- has gotten taxi to the clinic for appointment in the past when her car broke down.   Social Determinants of Health   Financial Resource Strain: Low Risk  (06/07/2018)   Overall Financial Resource Strain (CARDIA)    Difficulty of Paying Living Expenses: Not hard at all  Recent Concern: Financial Resource Strain - Medium Risk (05/21/2018)   Overall Financial Resource Strain (CARDIA)    Difficulty of Paying Living Expenses: Somewhat hard  Food Insecurity: No Food Insecurity (05/21/2018)   Hunger Vital Sign    Worried About Running Out of Food in the Last Year: Never true    Ran Out of Food in the Last Year: Never true  Transportation Needs: No Transportation Needs (05/21/2018)   PRAPARE - Hydrologist (Medical): No    Lack of Transportation (Non-Medical): No  Physical Activity: Not on file  Stress: Not on file  Social  Connections: Not on file  Intimate Partner Violence: Not on file   Family History  Problem Relation Age of Onset   Cancer Mother        LYMPHOMA  Heart disease Father    Other Father        TB   CVA Sister    Prostate cancer Brother 44   Diabetes Brother       Vanessa Kick, MD 04/14/22 1255

## 2022-04-19 ENCOUNTER — Ambulatory Visit (INDEPENDENT_AMBULATORY_CARE_PROVIDER_SITE_OTHER): Payer: HMO

## 2022-04-19 DIAGNOSIS — I442 Atrioventricular block, complete: Secondary | ICD-10-CM

## 2022-04-19 LAB — CUP PACEART REMOTE DEVICE CHECK
Date Time Interrogation Session: 20230905115847
Implantable Lead Implant Date: 20220606
Implantable Lead Location: 753860
Implantable Lead Model: 377169
Implantable Lead Serial Number: 8000396500
Implantable Pulse Generator Implant Date: 20220606
Pulse Gen Model: 407145
Pulse Gen Serial Number: 70105656

## 2022-04-20 DIAGNOSIS — R0602 Shortness of breath: Secondary | ICD-10-CM | POA: Diagnosis not present

## 2022-04-21 ENCOUNTER — Telehealth: Payer: Self-pay | Admitting: Internal Medicine

## 2022-04-21 NOTE — Telephone Encounter (Signed)
Pt daughter called stating that Dr. Lovena Le and the pt discussed a surgery to put a wire in her pacemaker because after her heart isn't working correctly. Please advise.

## 2022-04-21 NOTE — Telephone Encounter (Signed)
Returned phone call to Olin Hauser, patients daughter.   Daughter states patient and daughter have agreed that patients health is declining and patient would like to add the third wire as discussed previously. Advised I will forward to Dr. Lovena Le to let him know and someone will call to follow up. Appreciative for call.

## 2022-04-23 ENCOUNTER — Encounter (HOSPITAL_COMMUNITY): Payer: Self-pay

## 2022-04-23 ENCOUNTER — Ambulatory Visit (HOSPITAL_COMMUNITY)
Admission: EM | Admit: 2022-04-23 | Discharge: 2022-04-23 | Disposition: A | Discharge status: Home / Self Care | Payer: HMO | Attending: Emergency Medicine | Admitting: Emergency Medicine

## 2022-04-23 DIAGNOSIS — R0602 Shortness of breath: Secondary | ICD-10-CM

## 2022-04-23 DIAGNOSIS — J441 Chronic obstructive pulmonary disease with (acute) exacerbation: Secondary | ICD-10-CM

## 2022-04-23 MED ORDER — IPRATROPIUM-ALBUTEROL 0.5-2.5 (3) MG/3ML IN SOLN
3.0000 mL | Freq: Once | RESPIRATORY_TRACT | Status: AC
  Administered 2022-04-23: 3 mL via RESPIRATORY_TRACT

## 2022-04-23 MED ORDER — IPRATROPIUM-ALBUTEROL 0.5-2.5 (3) MG/3ML IN SOLN
RESPIRATORY_TRACT | Status: AC
  Filled 2022-04-23: qty 3

## 2022-04-23 NOTE — ED Notes (Signed)
Breathing treatment completed, Sat 99%, pt states she is feeling better.

## 2022-04-23 NOTE — Discharge Instructions (Addendum)
Please take your inhaler every 6 hours as needed.  You can use your nebulizer machine if you feel the inhaler does not improve your symptoms.  I recommend following up with your pulmonary doctor, keep your appointment for this month.  Please go to the emergency department if symptoms worsen including any shortness of breath, difficulty breathing or chest pain.

## 2022-04-23 NOTE — ED Triage Notes (Signed)
Pt states Sob since last night.  Resp even and unlabored. Sat 98% on RA.

## 2022-04-23 NOTE — ED Provider Notes (Signed)
[facility]    CSN: [id number] Arrival date & time: 04/23/22  1702      History   Chief Complaint Chief Complaint  Patient presents with   Shortness of Breath    HPI [name] [name] is a 76 y.o. female.  Presents for shortness of breath and nasal congestion.  Reports that began last night.  She has tried her albuterol inhaler and Afrin nasal spray. Denies chest pain.  On initial exam she is tachypneic but oxygenating well at 98% on room air.  History of heart failure, COPD, asthma She was seen here last month for COPD exacerbation  Past Medical History:  Diagnosis Date   (HFimpEF) heart failure with improved ejection fraction ([place])    a. 07/2018 Echo: EF 30-35%; b. 11/2019 Echo: EF 55-60%, no rwma, Gr2 DD, Nl RV size/fxn. Mild BAE. Mild MR/AI.   Arthritis    Asthma    Atopic dermatitis    CAD (coronary artery disease)    a. 2016 s/p CABG x 2 (LIMA->LAD, VG->OM); b. 08/2018 MV: EF 44%, no ischemia/infact.   Cardiac arrest ([place]) 10/2014   Cardiomyopathy, ischemic    a. 07/2018 Echo: EF 30-35%; 11/2019 Echo: EF 55-60%.   Carotid arterial disease ([place])    a. 11/2019 Carotid U/S   CHF (congestive heart failure) (HCC)    CKD (chronic kidney disease), stage III (HCC)    COPD (chronic obstructive pulmonary disease) (HCC)    Diabetes mellitus without complication ([place])    Dysrhythmia    Esophageal dilatation 2013   GERD (gastroesophageal reflux disease)    Gout    Headache    Hypertension    Hypokalemia    Idiopathic angioedema    LGI bleed 08/06/2017   a. felt to be hemorrhoidal during that admission (no drop in Hgb).   Lower back pain    Paroxysmal atrial fibrillation (HCC)    a. Dx 2016-->h/o difficult to control rates (complicated by noncompliance), not felt to be a candidate for ablation or antiarrhythmic due to noncompliance; b. Recurrent AF 2021 - converted w/ IV dilt; c. CHA2DS2VASc = 7-->Xarelto.   Personal history of noncompliance with medical treatment,  presenting hazards to health    Prediabetes    Presence of permanent cardiac pacemaker    S/P CABG x 2 with clipping of LA appendage 11/14/2014   LIMA to LAD, SVG to OM, EVH via right thigh   Urine incontinence    Uterine fibroid     Patient Active Problem List   Diagnosis Date Noted   Pacemaker 02/22/2022   Obstructive sleep apnea 09/21/2020   Tobacco dependence 02/10/2020   Obesity (BMI 30.0-34.9) 02/10/2020   COPD (chronic obstructive pulmonary disease) ([place]) 10/26/2019   Thrombocytopenia ([place]) 09/14/2017   Medication noncompliance due to cognitive impairment 09/06/2017   Persistent atrial fibrillation ([place]) 08/04/2017   Paroxysmal atrial fibrillation ([place]) 02/08/2017   Cardiomyopathy, ischemic 02/08/2017   CKD (chronic kidney disease), stage III ([place]) 01/30/2017   S/P CABG x 2 11/14/2014   Coronary artery disease due to lipid rich plaque    Essential hypertension    Chronic combined systolic and diastolic CHF (congestive heart failure) ([place]) 10/30/2014    Past Surgical History:  Procedure Laterality Date   AV NODE ABLATION N/A 01/18/2021   Procedure: AV NODE ABLATION;  Surgeon: Evans Lance, MD;  Location: [facility];  Service: Cardiovascular;  Laterality: N/A;   BALLOON DILATION N/A 10/10/2019   Procedure: BALLOON DILATION;  Surgeon: Alessandra Bevels,  Orson Gear, MD;  Location: [facility];  Service: Gastroenterology;  Laterality: N/A;   BIOPSY  10/10/2019   Procedure: BIOPSY;  Surgeon: Otis Brace, MD;  Location: [facility];  Service: Gastroenterology;;   BIOPSY  10/11/2019   Procedure: BIOPSY;  Surgeon: Otis Brace, MD;  Location: [facility];  Service: Gastroenterology;;   CARDIOVERSION N/A 11/18/2014   Procedure: CARDIOVERSION;  Surgeon: [name], MD;  Location: [facility];  Service: Cardiovascular;  Laterality: N/A;   CARDIOVERSION N/A 12/25/2017   Procedure: CARDIOVERSION;  Surgeon: [name], MD;  Location: [facility];  Service:  Cardiovascular;  Laterality: N/A;   CLIPPING OF ATRIAL APPENDAGE N/A 11/14/2014   Procedure: CLIPPING OF ATRIAL APPENDAGE;  Surgeon: [name], MD;  Location: [facility];  Service: Open Heart Surgery;  Laterality: N/A;   COLONOSCOPY  2013   COLONOSCOPY WITH PROPOFOL N/A 10/11/2019   Procedure: COLONOSCOPY WITH PROPOFOL;  Surgeon: Otis Brace, MD;  Location: [facility];  Service: Gastroenterology;  Laterality: N/A;   CORONARY ARTERY BYPASS GRAFT N/A 11/14/2014   Procedure: CORONARY ARTERY BYPASS GRAFTING (CABG)TIMES 2 USING LEFT INTERNAL MAMMARY ARTERY AND RIGHT SAPHENOUS VEIN HARVESTED ENDOSCOPICALLY;  Surgeon: [name], MD;  Location: [facility];  Service: Open Heart Surgery;  Laterality: N/A;   ESOPHAGOGASTRODUODENOSCOPY (EGD) WITH PROPOFOL N/A 10/10/2019   Procedure: ESOPHAGOGASTRODUODENOSCOPY (EGD) WITH PROPOFOL;  Surgeon: Otis Brace, MD;  Location: [facility];  Service: Gastroenterology;  Laterality: N/A;   ESOPHAGOGASTRODUODENOSCOPY (EGD) WITH PROPOFOL N/A 10/27/2019   Procedure: ESOPHAGOGASTRODUODENOSCOPY (EGD) WITH PROPOFOL;  Surgeon: Ronnette Juniper, MD;  Location: [facility];  Service: Gastroenterology;  Laterality: N/A;   GIVENS CAPSULE STUDY N/A 10/27/2019   Procedure: GIVENS CAPSULE STUDY;  Surgeon: Ronnette Juniper, MD;  Location: [facility];  Service: Gastroenterology;  Laterality: N/A;   INSERT / REPLACE / REMOVE PACEMAKER     LEFT HEART CATHETERIZATION WITH CORONARY ANGIOGRAM N/A 11/04/2014   Procedure: LEFT HEART CATHETERIZATION WITH CORONARY ANGIOGRAM;  Surgeon: Troy Sine, MD;  Location[facility];  Service: Cardiovascular;  Laterality: N/A;   PACEMAKER IMPLANT N/A 01/18/2021   Procedure: PACEMAKER IMPLANT;  Surgeon: Evans Lance, MD;  Location: [facility];  Service: Cardiovascular;  Laterality: N/A;   PACEMAKER IMPLANT Left    POLYPECTOMY  10/11/2019   Procedure: POLYPECTOMY;  Surgeon: Otis Brace, MD;  Location: [facility];  Service:  Gastroenterology;;   TEE WITHOUT CARDIOVERSION N/A 11/14/2014   Procedure: TRANSESOPHAGEAL ECHOCARDIOGRAM (TEE);  Surgeon: [name], MD;  Location: [facility];  Service: Open Heart Surgery;  Laterality: N/A;   TEE WITHOUT CARDIOVERSION N/A 12/25/2017   Procedure: TRANSESOPHAGEAL ECHOCARDIOGRAM (TEE);  Surgeon: [name], MD;  Location: [facility]OSCOPY;  Service: Cardiovascular;  Laterality: N/A;   TEMPORARY PACEMAKER INSERTION  11/04/2014   Procedure: TEMPORARY PACEMAKER INSERTION;  Surgeon: Troy Sine, MD;  Location[facility] C[facility];  Service: Cardiovascular;;   TOOTH EXTRACTION      OB History   No obstetric history on file.      Home Medications    Prior to Admission medications   Medication Sig Start Date End Date Taking? Authorizing Provider  acetaminophen (TYLENOL) 325 MG tablet Take 2 tablets (650 mg total) by mouth every 4 (four) hours as needed for headache or mild pain. 12/09/20   Mikhail, Velta Addison, DO  albuterol (VENTOLIN HFA) 108 (90 Base) MCG/ACT inhaler Inhale 1-2 puffs into the lungs every 6 (six) hours as needed for wheezing or shortness of breath. 03/20/22   Firmin Belisle,  Wells Guiles, PA-C  allopurinol (ZYLOPRIM) 100 MG tablet Take 100 mg by mouth daily. 07/19/21   [provider]  atorvastatin (LIPITOR) 10 MG tablet Take 1 tablet (10 mg total) by mouth at bedtime. 03/17/22   Janith Lima, MD  azithromycin (ZITHROMAX) 250 MG tablet Take 1 tablet (250 mg total) by mouth daily. Take first 2 tablets together, then 1 every day until finished. 03/14/22   White, Leitha Schuller, NP  dextromethorphan-guaiFENesin (MUCINEX DM) 30-600 MG 12hr tablet Take 1 tablet by mouth 2 (two) times daily. Take 1/2 tablet every 12 hours for cough. 02/05/22   Nyoka Lint, PA-C  Fluticasone-Umeclidin-Vilant (TRELEGY ELLIPTA) 100-62.5-25 MCG/ACT AEPB Inhale 1 puff into the lungs daily. 03/16/22   Janith Lima, MD  ipratropium-albuterol (DUONEB) 0.5-2.5 (3) MG/3ML SOLN Take 3 mLs by nebulization every  6 (six) hours as needed. 03/20/22   Kristjan Derner, Wells Guiles, PA-C  losartan (COZAAR) 50 MG tablet Take 1 tablet (50 mg total) by mouth daily. 12/22/21   Bensimhon, Shaune Pascal, MD  metFORMIN (GLUCOPHAGE) 500 MG tablet Take 500 mg by mouth daily with breakfast. Daily with a meal    [provider]  metoprolol succinate (TOPROL-XL) 100 MG 24 hr tablet Take 1 tablet (100 mg total) by mouth daily. Take with or immediately following a meal. 04/30/21 07/30/22  Evans Lance, MD  pantoprazole (PROTONIX) 40 MG tablet Take 1 tablet (40 mg total) by mouth daily. 04/14/22   Vanessa Kick, MD  Prednisol Ace-Moxiflox-Bromfen 1-0.5-0.075 % SUSP Place 1 drop into the right eye 4 (four) times daily. 05/04/21   [provider]  rivaroxaban (XARELTO) 20 MG TABS tablet Take 1 tablet (20 mg total) by mouth daily with supper. Resume from 11/01/2019 11/01/19   Aline August, MD  spironolactone (ALDACTONE) 25 MG tablet Take 1 tablet (25 mg total) by mouth daily. 06/21/21   Rafael Bihari, FNP  torsemide (DEMADEX) 20 MG tablet Take 20 mg by mouth daily.    [provider]    Family History Family History  Problem Relation Age of Onset   Cancer Mother        LYMPHOMA   Heart disease Father    Other Father        TB   CVA Sister    Prostate cancer Brother 27   Diabetes Brother     Social History Social History   Tobacco Use   Smoking status: Former    Packs/day: 0.50    Years: 56.00    Total pack years: 28.00    Types: Cigarettes    Quit date: 11/13/2021    Years since quitting: 0.4   Smokeless tobacco: Never   Tobacco comments:    smoking 2 cigarettes/day as of 10/23/20  Vaping Use   Vaping Use: Never used  Substance Use Topics   Alcohol use: No    Alcohol/week: 0.0 standard drinks of alcohol   Drug use: No     Allergies   Bee venom, Ivp dye [iodinated contrast media], Ace inhibitors, Atenolol, Codeine, Entresto [sacubitril-valsartan], Penicillin g, Shrimp [shellfish allergy],  Simvastatin, and Jardiance [empagliflozin]   Review of Systems Review of Systems  Respiratory:  Positive for shortness of breath.    Per HPI  Physical Exam Triage Vital Signs ED Triage Vitals  Enc Vitals Group     BP 04/23/22 1803 (!) 156/93     Pulse Rate 04/23/22 1803 72     Resp 04/23/22 1803 18     Temp 04/23/22 1803 98.2  F (36.8 C)     Temp Source 04/23/22 1803 Oral     SpO2 04/23/22 1803 98 %     Weight --      Height --      Head Circumference --      Peak Flow --      Pain Score 04/23/22 1805 0     Pain Loc --      Pain Edu? --      Excl. in [place]? --    No data found.  Updated Vital Signs BP (!) 156/93 (BP Location: Right Arm)   Pulse 72   Temp 98.2 F (36.8 C) (Oral)   Resp 18   SpO2 98%   Physical Exam Vitals and nursing note reviewed.  Constitutional:      General: She is not in acute distress.    Appearance: She is not ill-appearing.  HENT:     Nose: No congestion.     Mouth/Throat:     Mouth: Mucous membranes are moist.     Pharynx: Oropharynx is clear.  Eyes:     Conjunctiva/sclera: Conjunctivae normal.  Cardiovascular:     Rate and Rhythm: Normal rate and regular rhythm.     Pulses: Normal pulses.     Heart sounds: Normal heart sounds.  Pulmonary:     Effort: Tachypnea present. No respiratory distress.     Breath sounds: Decreased air movement present. No wheezing or rales.  Musculoskeletal:     Cervical back: Normal range of motion and neck supple.  Skin:    General: Skin is warm and dry.  Neurological:     Mental Status: She is alert and oriented to person, place, and time.      UC Treatments / Results  Labs (all labs ordered are listed, but only abnormal results are displayed) Labs Reviewed - No data to display  EKG  Radiology No results found.  Procedures Procedures (including critical care time)  Medications Ordered in UC Medications  ipratropium-albuterol (DUONEB) 0.5-2.5 (3) MG/3ML nebulizer solution 3 mL (3 mLs  Nebulization Given 04/23/22 1839)    Initial Impression / Assessment and Plan / UC Course  I have reviewed the triage vital signs and the nursing notes.  Pertinent labs & imaging results that were available during my care of the patient were reviewed by me and considered in my medical decision making (see chart for details).  Slightly tachypneic 22 resp/min, patient states this is her baseline and she usually breathes a little faster than most. Otherwise well appearing without resp distress. Oxygenating on room air between 97-99% throughout visit HR is stable. DuoNeb given and patient states her shortness of breath has resolved and she is feeling much better. She does have an upcoming appointment with her pulmonary doctor this month.  I recommend keep this appointment and follow-up with them regarding her symptoms.  She can use inhaler/nebulizer as needed.  Strict ED precautions given to which patient verbalizes understanding  Final Clinical Impressions(s) / UC Diagnoses   Final diagnoses:  COPD exacerbation ([place])  Shortness of breath     Discharge Instructions      Please take your inhaler every 6 hours as needed.  You can use your nebulizer machine if you feel the inhaler does not improve your symptoms.  I recommend following up with your pulmonary doctor, keep your appointment for this month.  Please go to the emergency department if symptoms worsen including any shortness of breath, difficulty breathing or chest pain.  ED Prescriptions   None    PDMP not reviewed this encounter.   Davi Kroon, Vernice Jefferson 04/23/22 1920

## 2022-04-25 NOTE — Telephone Encounter (Signed)
Pt's daughter is calling back again to discuss possibility of surgery. Requesting call back to schedule surgery due to mother's declining health.

## 2022-04-25 NOTE — Telephone Encounter (Signed)
Called Pt daughter back, and got Ms. Rosana Hoes / patient on the telephone.   Ms. Battaglia seemed nervous about the procedure, and had many questions.  She stated her daughter was in [place], and called [place] from there.   Called Ms. Kathe Becton number on file to discuss concerns regarding her mother.  Was unable to connect, and left voice mail message for her to call me back.     I told Ms. Padia I would discuss her concerns with Dr. Lovena Le, as I am in clinic with him tomorrow, 9/12.  Follow up still required.

## 2022-04-25 NOTE — Telephone Encounter (Signed)
Pt daughter, [name] [name] called back.   Ms. Chauca, who lives in [place] wants her mother to have 41rd wire placed as soon as possible / procedure done with Dr. Lovena Le.   The patient has a fear of hospitals, and procedures per daughter.  Ms. Williams, patients daughter, scheduled an appointment with PA-C on 05/10/2022 for surgical assessment to try to get procedure moving forward for mother.  Pt daughter wants procedure completed prior to November.    I told Ms. Paige that I would discuss this with Dr. Lovena Le, obtain his thoughts, and call her back to follow up.

## 2022-04-27 NOTE — Telephone Encounter (Signed)
Returned call to daughter [name] [name], speaking on mothers ( patients behalf.)  Pt saw Dr. Lovena Le on 02/22/2022. Pt diagnosed with CSHF, Persistent Atrial Fib, and HTN.    Options per Dr. Tanna Furry note on 02/22/2022:   A. Upgrade to a biv ICD   B. Start dofetilide and if she is unable to conduct, upgrade to a biv ICD  C. Start amiodarone, DCCV and upgrade to an ICD. I have explained these and written my thought down for her to ponder and she will call us and let us know what she would like to pursue.   The options listed above, combined with FEAR of the clinical environment, have made it difficult for Pt [name] [name] to make a decision.  The daughter [name] [name] states she is concerned about the pacer wires / health of her mothers heart, and wants the procedure scheduled and completed prior to November per a previous note.   [name] [name], called and scheduled the appointment with [name] Kilts, PA-C to: Re-visit Dr. Tanna Furry care plan options Have her mother's heart re-checked,  To have [name] PA-C calm her clinical environment FEARS by explaining what to expect at the hospital from admission to discharge.    I told [name] that I would make [name], PA-C aware of her mother's concerns, and let him know prior to the 05/10/2022 appointment.  Will forward this message to him to review prior to seeing patient.

## 2022-04-28 ENCOUNTER — Emergency Department (HOSPITAL_COMMUNITY)
Admission: EM | Admit: 2022-04-28 | Discharge: 2022-04-28 | Disposition: A | Discharge status: Home / Self Care | Payer: HMO | Attending: Emergency Medicine | Admitting: Emergency Medicine

## 2022-04-28 ENCOUNTER — Other Ambulatory Visit: Payer: Self-pay

## 2022-04-28 ENCOUNTER — Encounter (HOSPITAL_COMMUNITY): Payer: Self-pay | Admitting: Emergency Medicine

## 2022-04-28 DIAGNOSIS — R1030 Lower abdominal pain, unspecified: Secondary | ICD-10-CM | POA: Insufficient documentation

## 2022-04-28 DIAGNOSIS — R0602 Shortness of breath: Secondary | ICD-10-CM | POA: Insufficient documentation

## 2022-04-28 DIAGNOSIS — R062 Wheezing: Secondary | ICD-10-CM | POA: Diagnosis not present

## 2022-04-28 DIAGNOSIS — Z20822 Contact with and (suspected) exposure to covid-19: Secondary | ICD-10-CM | POA: Insufficient documentation

## 2022-04-28 DIAGNOSIS — R197 Diarrhea, unspecified: Secondary | ICD-10-CM | POA: Insufficient documentation

## 2022-04-28 DIAGNOSIS — Z5321 Procedure and treatment not carried out due to patient leaving prior to being seen by health care provider: Secondary | ICD-10-CM | POA: Diagnosis not present

## 2022-04-28 LAB — CBC WITH DIFFERENTIAL/PLATELET
Abs Immature Granulocytes: 0.03 10*3/uL (ref 0.00–0.07)
Basophils Absolute: 0.1 10*3/uL (ref 0.0–0.1)
Basophils Relative: 1 %
Eosinophils Absolute: 0.1 10*3/uL (ref 0.0–0.5)
Eosinophils Relative: 1 %
HCT: 46.4 % — ABNORMAL HIGH (ref 36.0–46.0)
Hemoglobin: 15.4 g/dL — ABNORMAL HIGH (ref 12.0–15.0)
Immature Granulocytes: 0 %
Lymphocytes Relative: 31 %
Lymphs Abs: 2.2 10*3/uL (ref 0.7–4.0)
MCH: 30.3 pg (ref 26.0–34.0)
MCHC: 33.2 g/dL (ref 30.0–36.0)
MCV: 91.2 fL (ref 80.0–100.0)
Monocytes Absolute: 0.6 10*3/uL (ref 0.1–1.0)
Monocytes Relative: 9 %
Neutro Abs: 4.2 10*3/uL (ref 1.7–7.7)
Neutrophils Relative %: 58 %
Platelets: 118 10*3/uL — ABNORMAL LOW (ref 150–400)
RBC: 5.09 MIL/uL (ref 3.87–5.11)
RDW: 16.3 % — ABNORMAL HIGH (ref 11.5–15.5)
WBC: 7.2 10*3/uL (ref 4.0–10.5)
nRBC: 0 % (ref 0.0–0.2)

## 2022-04-28 LAB — RESP PANEL BY RT-PCR (FLU A&B, COVID) ARPGX2
Influenza A by PCR: NEGATIVE
Influenza B by PCR: NEGATIVE
SARS Coronavirus 2 by RT PCR: NEGATIVE

## 2022-04-28 LAB — COMPREHENSIVE METABOLIC PANEL
ALT: 15 U/L (ref 0–44)
AST: 17 U/L (ref 15–41)
Albumin: 3.4 g/dL — ABNORMAL LOW (ref 3.5–5.0)
Alkaline Phosphatase: 100 U/L (ref 38–126)
Anion gap: 8 (ref 5–15)
BUN: 23 mg/dL (ref 8–23)
CO2: 19 mmol/L — ABNORMAL LOW (ref 22–32)
Calcium: 9.3 mg/dL (ref 8.9–10.3)
Chloride: 116 mmol/L — ABNORMAL HIGH (ref 98–111)
Creatinine, Ser: 1.28 mg/dL — ABNORMAL HIGH (ref 0.44–1.00)
GFR, Estimated: 43 mL/min — ABNORMAL LOW (ref >60)
Glucose, Bld: 107 mg/dL — ABNORMAL HIGH (ref 70–99)
Potassium: 4.2 mmol/L (ref 3.5–5.1)
Sodium: 143 mmol/L (ref 135–145)
Total Bilirubin: 1.6 mg/dL — ABNORMAL HIGH (ref 0.3–1.2)
Total Protein: 6.7 g/dL (ref 6.5–8.1)

## 2022-04-28 LAB — BRAIN NATRIURETIC PEPTIDE: B Natriuretic Peptide: 1166.4 pg/mL — ABNORMAL HIGH (ref 0.0–100.0)

## 2022-04-28 LAB — LIPASE, BLOOD: Lipase: 22 U/L (ref 11–51)

## 2022-04-28 LAB — TROPONIN I (HIGH SENSITIVITY): Troponin I (High Sensitivity): 31 ng/L — ABNORMAL HIGH (ref <18)

## 2022-04-28 MED ORDER — IPRATROPIUM-ALBUTEROL 0.5-2.5 (3) MG/3ML IN SOLN: 3.0000 mL | Freq: Once | RESPIRATORY_TRACT | Status: DC

## 2022-04-28 NOTE — ED Notes (Signed)
Pt informed this EMT that she could not wait any longer and was going to leave. Pt left the lobby and moved OTF.

## 2022-04-28 NOTE — ED Triage Notes (Signed)
Pt reports SOB ongoing for the last month. Seen by PCP and give steroid shot 3 weeks ago. States her breathing is worse at night when she lies down. No respiratory distress noted at present. States has been using an inhaler. Denies CP. Hx of afib/

## 2022-04-28 NOTE — ED Provider Triage Note (Signed)
Emergency Medicine Provider Triage Evaluation Note  [name] [name] , a 76 y.o. female  was evaluated in triage.  Pt complains of shortness of breath and diarrhea.  Patient reports she has had shortness of breath over the past month, worsening today.  Difficulty laying flat.  Was recently treated for a COPD exacerbation and had temporary improvement after steroids but then breathing worsened again.  Reports severe wheezing last night and this morning.  Used inhaler prior to arrival.  She also reports some lower abdominal discomfort and diarrhea since last night.  No vomiting.  Review of Systems  Positive: Shortness of breath, diarrhea Negative: Chest pain, palpitations, syncope, vomiting  Physical Exam  BP (!) 160/119 (BP Location: Right Arm)   Pulse 71   Temp 98 F (36.7 C) (Oral)   Resp (!) 24   Ht '5\' 7"'$  (1.702 m)   Wt 87.2 kg   SpO2 95%   BMI 30.11 kg/m  Gen:   Awake, no distress   Resp:  Patient mildly tachypneic but satting well on room air, lungs with some decreased air movement but no focal wheezing MSK:   Moves extremities without difficulty  Other:    Medical Decision Making  Medically screening exam initiated at 11:49 AM.  Appropriate orders placed.  [name] [name] was informed that the remainder of the evaluation will be completed by another provider, this initial triage assessment does not replace that evaluation, and the importance of remaining in the ED until their evaluation is complete.     Jacqlyn Larsen, [place] 04/28/22 1202

## 2022-05-02 NOTE — Progress Notes (Deleted)
Electrophysiology Office Note Date: 05/02/2022  ID:  [name], [name] April 10, 1946, MRN [id number]  PCP: Janith Lima, MD Primary Cardiologist: [name], MD Electrophysiologist: [name], MD   CC: Pacemaker follow-up  [name] [name] is a 76 y.o. female seen today for [name], MD for routine electrophysiology followup.   Seen by Dr. Lovena Le 02/22/2022 with continued NYHA III symptoms and systolic dysfunction. Discussed options for moving forward:  A. Upgrade to a biv ICD B. Start dofetilide and if she is unable to conduct, upgrade to a biv ICD C. Start amiodarone, DCCV and upgrade to an ICD. I have explained these and written my thought down for her to ponder and she will call us and let us know what she would like to pursue.    Since last being seen in our clinic the patient reports ***  Device History: Biotronik Single Chamber PPM implanted 01/2021 for uncontrolled atrial arryhtmia s/p   Past Medical History:  Diagnosis Date   (HFimpEF) heart failure with improved ejection fraction ([name] [name])    a. 07/2018 Echo: EF 30-35%; b. 11/2019 Echo: EF 55-60%, no rwma, Gr2 DD, Nl RV size/fxn. Mild BAE. Mild MR/AI.   Arthritis    Asthma    Atopic dermatitis    CAD (coronary artery disease)    a. 2016 s/p CABG x 2 (LIMA->LAD, VG->OM); b. 08/2018 MV: EF 44%, no ischemia/infact.   Cardiac arrest ([place]) 10/2014   Cardiomyopathy, ischemic    a. 07/2018 Echo: EF 30-35%; 11/2019 Echo: EF 55-60%.   Carotid arterial disease ([place])    a. 11/2019 Carotid U/S   CHF (congestive heart failure) (HCC)    CKD (chronic kidney disease), stage III (HCC)    COPD (chronic obstructive pulmonary disease) (HCC)    Diabetes mellitus without complication ([place])    Dysrhythmia    Esophageal dilatation 2013   GERD (gastroesophageal reflux disease)    Gout    Headache    Hypertension    Hypokalemia    Idiopathic angioedema    LGI bleed 08/06/2017   a. felt to be hemorrhoidal during that admission (no  drop in Hgb).   Lower back pain    Paroxysmal atrial fibrillation (HCC)    a. Dx 2016-->h/o difficult to control rates (complicated by noncompliance), not felt to be a candidate for ablation or antiarrhythmic due to noncompliance; b. Recurrent AF 2021 - converted w/ IV dilt; c. CHA2DS2VASc = 7-->Xarelto.   Personal history of noncompliance with medical treatment, presenting hazards to health    Prediabetes    Presence of permanent cardiac pacemaker    S/P CABG x 2 with clipping of LA appendage 11/14/2014   LIMA to LAD, SVG to OM, EVH via right thigh   Urine incontinence    Uterine fibroid    Past Surgical History:  Procedure Laterality Date   AV NODE ABLATION N/A 01/18/2021   Procedure: AV NODE ABLATION;  Surgeon: Evans Lance, MD;  Location: [facility];  Service: Cardiovascular;  Laterality: N/A;   BALLOON DILATION N/A 10/10/2019   Procedure: BALLOON DILATION;  Surgeon: Otis Brace, MD;  Location: [facility];  Service: Gastroenterology;  Laterality: N/A;   BIOPSY  10/10/2019   Procedure: BIOPSY;  Surgeon: Otis Brace, MD;  Location: [facility];  Service: Gastroenterology;;   BIOPSY  10/11/2019   Procedure: BIOPSY;  Surgeon: Otis Brace, MD;  Location: [facility] ENDOSCOPY;  Service: Gastroenterology;;   CARDIOVERSION N/A 11/18/2014   Procedure: CARDIOVERSION;  Surgeon: Chrissie Noa  Wells Guiles, MD;  Location: [facility];  Service: Cardiovascular;  Laterality: N/A;   CARDIOVERSION N/A 12/25/2017   Procedure: CARDIOVERSION;  Surgeon: [name], MD;  Location: [place] [place];  Service: Cardiovascular;  Laterality: N/A;   CLIPPING OF ATRIAL APPENDAGE N/A 11/14/2014   Procedure: CLIPPING OF ATRIAL APPENDAGE;  Surgeon: [name], MD;  Location: [facility];  Service: Open Heart Surgery;  Laterality: N/A;   COLONOSCOPY  2013   COLONOSCOPY WITH PROPOFOL N/A 10/11/2019   Procedure: COLONOSCOPY WITH PROPOFOL;  Surgeon: Otis Brace, MD;  Location: [facility];  Service:  Gastroenterology;  Laterality: N/A;   CORONARY ARTERY BYPASS GRAFT N/A 11/14/2014   Procedure: CORONARY ARTERY BYPASS GRAFTING (CABG)TIMES 2 USING LEFT INTERNAL MAMMARY ARTERY AND RIGHT SAPHENOUS VEIN HARVESTED ENDOSCOPICALLY;  Surgeon: [name], MD;  Location: [facility];  Service: Open Heart Surgery;  Laterality: N/A;   ESOPHAGOGASTRODUODENOSCOPY (EGD) WITH PROPOFOL N/A 10/10/2019   Procedure: ESOPHAGOGASTRODUODENOSCOPY (EGD) WITH PROPOFOL;  Surgeon: Otis Brace, MD;  Location: [facility];  Service: Gastroenterology;  Laterality: N/A;   ESOPHAGOGASTRODUODENOSCOPY (EGD) WITH PROPOFOL N/A 10/27/2019   Procedure: ESOPHAGOGASTRODUODENOSCOPY (EGD) WITH PROPOFOL;  Surgeon: Ronnette Juniper, MD;  Location: [facility];  Service: Gastroenterology;  Laterality: N/A;   GIVENS CAPSULE STUDY N/A 10/27/2019   Procedure: GIVENS CAPSULE STUDY;  Surgeon: Ronnette Juniper, MD;  Location: [facility];  Service: Gastroenterology;  Laterality: N/A;   INSERT / REPLACE / REMOVE PACEMAKER     LEFT HEART CATHETERIZATION WITH CORONARY ANGIOGRAM N/A 11/04/2014   Procedure: LEFT HEART CATHETERIZATION WITH CORONARY ANGIOGRAM;  Surgeon: Troy Sine, MD;  Location: [facility] CATH LAB;  Service: Cardiovascular;  Laterality: N/A;   PACEMAKER IMPLANT N/A 01/18/2021   Procedure: PACEMAKER IMPLANT;  Surgeon: Evans Lance, MD;  Location: [facility];  Service: Cardiovascular;  Laterality: N/A;   PACEMAKER IMPLANT Left    POLYPECTOMY  10/11/2019   Procedure: POLYPECTOMY;  Surgeon: Otis Brace, MD;  Location: [facility] ENDOSCOPY;  Service: Gastroenterology;;   TEE WITHOUT CARDIOVERSION N/A 11/14/2014   Procedure: TRANSESOPHAGEAL ECHOCARDIOGRAM (TEE);  Surgeon: [name], MD;  Location: [place];  Service: Open Heart Surgery;  Laterality: N/A;   TEE WITHOUT CARDIOVERSION N/A 12/25/2017   Procedure: TRANSESOPHAGEAL ECHOCARDIOGRAM (TEE);  Surgeon: [name], MD;  Location: [facility] ENDOSCOPY;  Service: Cardiovascular;   Laterality: N/A;   TEMPORARY PACEMAKER INSERTION  11/04/2014   Procedure: TEMPORARY PACEMAKER INSERTION;  Surgeon: Troy Sine, MD;  Location: [facility] CATH LAB;  Service: Cardiovascular;;   TOOTH EXTRACTION      Current Outpatient Medications  Medication Sig Dispense Refill   acetaminophen (TYLENOL) 325 MG tablet Take 2 tablets (650 mg total) by mouth every 4 (four) hours as needed for headache or mild pain.     albuterol (VENTOLIN HFA) 108 (90 Base) MCG/ACT inhaler Inhale 1-2 puffs into the lungs every 6 (six) hours as needed for wheezing or shortness of breath. 18 g 2   allopurinol (ZYLOPRIM) 100 MG tablet Take 100 mg by mouth daily.     atorvastatin (LIPITOR) 10 MG tablet Take 1 tablet (10 mg total) by mouth at bedtime. 90 tablet 1   azithromycin (ZITHROMAX) 250 MG tablet Take 1 tablet (250 mg total) by mouth daily. Take first 2 tablets together, then 1 every day until finished. 6 tablet 0   dextromethorphan-guaiFENesin (MUCINEX DM) 30-600 MG 12hr tablet Take 1 tablet by mouth 2 (two) times daily. Take 1/2 tablet every 12 hours for cough. 14 tablet 0  Fluticasone-Umeclidin-Vilant (TRELEGY ELLIPTA) 100-62.5-25 MCG/ACT AEPB Inhale 1 puff into the lungs daily. 120 each 1   ipratropium-albuterol (DUONEB) 0.5-2.5 (3) MG/3ML SOLN Take 3 mLs by nebulization every 6 (six) hours as needed. 360 mL 1   losartan (COZAAR) 50 MG tablet Take 1 tablet (50 mg total) by mouth daily. 30 tablet 6   metFORMIN (GLUCOPHAGE) 500 MG tablet Take 500 mg by mouth daily with breakfast. Daily with a meal     metoprolol succinate (TOPROL-XL) 100 MG 24 hr tablet Take 1 tablet (100 mg total) by mouth daily. Take with or immediately following a meal. 90 tablet 3   pantoprazole (PROTONIX) 40 MG tablet Take 1 tablet (40 mg total) by mouth daily. 30 tablet 2   Prednisol Ace-Moxiflox-Bromfen 1-0.5-0.075 % SUSP Place 1 drop into the right eye 4 (four) times daily.     rivaroxaban (XARELTO) 20 MG TABS tablet Take 1 tablet (20 mg  total) by mouth daily with supper. Resume from 11/01/2019     spironolactone (ALDACTONE) 25 MG tablet Take 1 tablet (25 mg total) by mouth daily. 30 tablet 11   torsemide (DEMADEX) 20 MG tablet Take 20 mg by mouth daily.     No current facility-administered medications for this visit.    Allergies:   Bee venom, Ivp dye [iodinated contrast media], Ace inhibitors, Atenolol, Codeine, Entresto [sacubitril-valsartan], Penicillin g, Shrimp [shellfish allergy], Simvastatin, and Jardiance [empagliflozin]   Social History: Social History   Socioeconomic History   Marital status: Widowed    Spouse name: Not on file   Number of children: 1   Years of education: 14   Highest education level: Not on file  Occupational History   Occupation: RETIRED [employer]  Tobacco Use   Smoking status: Former    Packs/day: 0.50    Years: 56.00    Total pack years: 28.00    Types: Cigarettes    Quit date: 11/13/2021    Years since quitting: 0.4   Smokeless tobacco: Never   Tobacco comments:    smoking 2 cigarettes/day as of 10/23/20  Vaping Use   Vaping Use: Never used  Substance and Sexual Activity   Alcohol use: No    Alcohol/week: 0.0 standard drinks of alcohol   Drug use: No   Sexual activity: Not on file  Other Topics Concern   Not on file  Social History Narrative   Patient reports it being difficult to pay for everything due to outstanding medical bills from hospital stay last year but states she is able to keep up with basic expenses.  Patient does have some concerns with her house's condition due to a water leak- had her roof repaired last month but has some leaking in the front which has affected her porch.  Patient owns her own car and is able to drive herself but has some concerns about the reliability of her car- has gotten taxi to the clinic for appointment in the past when her car broke down.   Social Determinants of Health   Financial Resource Strain: Low Risk  (06/07/2018)    Overall Financial Resource Strain (CARDIA)    Difficulty of Paying Living Expenses: Not hard at all  Recent Concern: Financial Resource Strain - Medium Risk (05/21/2018)   Overall Financial Resource Strain (CARDIA)    Difficulty of Paying Living Expenses: Somewhat hard  Food Insecurity: No Food Insecurity (05/21/2018)   Hunger Vital Sign    Worried About Running Out of Food in the Last Year: Never  true    Ran Out of Food in the Last Year: Never true  Transportation Needs: No Transportation Needs (05/21/2018)   PRAPARE - Hydrologist (Medical): No    Lack of Transportation (Non-Medical): No  Physical Activity: Not on file  Stress: Not on file  Social Connections: Not on file  Intimate Partner Violence: Not on file    Family History: Family History  Problem Relation Age of Onset   Cancer Mother        LYMPHOMA   Heart disease Father    Other Father        TB   CVA Sister    Prostate cancer Brother 23   Diabetes Brother      Review of Systems: Review of systems complete and found to be negative unless listed in HPI.    Physical Exam: There were no vitals filed for this visit.    General: Pleasant, NAD. No resp difficulty Psych: Normal affect. HEENT:  Normal, without mass or lesion.         Neck: Supple, no bruits or JVD. Carotids 2+. No lymphadenopathy/thyromegaly appreciated. Heart: PMI nondisplaced. RRR no s3, s4, or murmurs. Lungs:  Resp regular and unlabored, CTA. Abdomen: Soft, non-tender, non-distended, No HSM, BS + x 4.   Extremities: No clubbing, cyanosis or edema. DP/PT/Radials 2+ and equal bilaterally. Neuro: Alert and oriented X 3. Moves all extremities spontaneously.   PPM Interrogation- reviewed in detail today,  See PACEART report  EKG:  EKG is not ordered today.  Recent Labs: 06/10/2021: Magnesium 1.8 03/16/2022: TSH 1.42 04/28/2022: ALT 15; B Natriuretic Peptide 1,166.4; BUN 23; Creatinine, Ser 1.28; Hemoglobin 15.4;  Platelets 118; Potassium 4.2; Sodium 143   Wt Readings from Last 3 Encounters:  04/28/22 192 lb 3.9 oz (87.2 kg)  04/14/22 189 lb (85.7 kg)  03/16/22 193 lb (87.5 kg)     Other studies Reviewed: Additional studies/ records that were reviewed today include: Previous EP office notes, Previous remote checks, Most recent labwork.   Assessment and Plan:  1. Uncontrolled atrial arrhyhtmia s/p AV node ablation s/p Biotronik PPM  Normal PPM function See Pace Art report No changes today  2. Chronic systolic CHF Echocardiogram 07/30/2021 with LVEF 25-30% Concern for pacing induced CMP with >70% RV pacing. Her intrinsic escape is in the 40s today, so decreasing LRL is a poor option.   Her device was implanted in the RV septal/Left bundle position, so CRT upgrade may not be an option.  Discussed with Dr. Lovena Le who recommended:   A. Upgrade to a biv ICD   B. Start dofetilide and if she is unable to conduct, upgrade to a biv ICD  C. Start amiodarone, DCCV and upgrade to an ICD. I have explained these and written my thought down for her to ponder and she will call us and let us know what she would like to pursue.   3. CAD s/p CABG 2016 No s/s   Current medicines are reviewed at length with the patient today.    Disposition:   Follow up with Dr. Lovena Le in ***  Signed, Shirley Friar, [place]  05/02/2022 3:03 PM  [facility] [address] [place] [address] [phone] (office) [phone] (fax)

## 2022-05-03 ENCOUNTER — Other Ambulatory Visit: Payer: Self-pay

## 2022-05-03 ENCOUNTER — Emergency Department (HOSPITAL_COMMUNITY): Payer: HMO

## 2022-05-03 ENCOUNTER — Emergency Department (HOSPITAL_COMMUNITY)
Admission: EM | Admit: 2022-05-03 | Discharge: 2022-05-03 | Disposition: A | Discharge status: Home / Self Care | Payer: HMO | Attending: Emergency Medicine | Admitting: Emergency Medicine

## 2022-05-03 DIAGNOSIS — Z950 Presence of cardiac pacemaker: Secondary | ICD-10-CM | POA: Diagnosis not present

## 2022-05-03 DIAGNOSIS — R600 Localized edema: Secondary | ICD-10-CM | POA: Diagnosis not present

## 2022-05-03 DIAGNOSIS — Z20822 Contact with and (suspected) exposure to covid-19: Secondary | ICD-10-CM | POA: Insufficient documentation

## 2022-05-03 DIAGNOSIS — R001 Bradycardia, unspecified: Secondary | ICD-10-CM | POA: Insufficient documentation

## 2022-05-03 DIAGNOSIS — R062 Wheezing: Secondary | ICD-10-CM | POA: Diagnosis not present

## 2022-05-03 DIAGNOSIS — I130 Hypertensive heart and chronic kidney disease with heart failure and stage 1 through stage 4 chronic kidney disease, or unspecified chronic kidney disease: Secondary | ICD-10-CM | POA: Insufficient documentation

## 2022-05-03 DIAGNOSIS — R0789 Other chest pain: Secondary | ICD-10-CM | POA: Diagnosis not present

## 2022-05-03 DIAGNOSIS — R079 Chest pain, unspecified: Secondary | ICD-10-CM

## 2022-05-03 DIAGNOSIS — E1122 Type 2 diabetes mellitus with diabetic chronic kidney disease: Secondary | ICD-10-CM | POA: Insufficient documentation

## 2022-05-03 DIAGNOSIS — J811 Chronic pulmonary edema: Secondary | ICD-10-CM | POA: Diagnosis not present

## 2022-05-03 DIAGNOSIS — Z951 Presence of aortocoronary bypass graft: Secondary | ICD-10-CM | POA: Diagnosis not present

## 2022-05-03 DIAGNOSIS — J449 Chronic obstructive pulmonary disease, unspecified: Secondary | ICD-10-CM | POA: Diagnosis not present

## 2022-05-03 DIAGNOSIS — Z7951 Long term (current) use of inhaled steroids: Secondary | ICD-10-CM | POA: Diagnosis not present

## 2022-05-03 DIAGNOSIS — Z7952 Long term (current) use of systemic steroids: Secondary | ICD-10-CM | POA: Insufficient documentation

## 2022-05-03 DIAGNOSIS — Z7901 Long term (current) use of anticoagulants: Secondary | ICD-10-CM | POA: Insufficient documentation

## 2022-05-03 DIAGNOSIS — I5030 Unspecified diastolic (congestive) heart failure: Secondary | ICD-10-CM | POA: Diagnosis not present

## 2022-05-03 DIAGNOSIS — I2510 Atherosclerotic heart disease of native coronary artery without angina pectoris: Secondary | ICD-10-CM | POA: Diagnosis not present

## 2022-05-03 DIAGNOSIS — Z79899 Other long term (current) drug therapy: Secondary | ICD-10-CM | POA: Diagnosis not present

## 2022-05-03 DIAGNOSIS — Z7984 Long term (current) use of oral hypoglycemic drugs: Secondary | ICD-10-CM | POA: Diagnosis not present

## 2022-05-03 DIAGNOSIS — R0602 Shortness of breath: Secondary | ICD-10-CM | POA: Diagnosis not present

## 2022-05-03 DIAGNOSIS — N183 Chronic kidney disease, stage 3 unspecified: Secondary | ICD-10-CM | POA: Diagnosis not present

## 2022-05-03 LAB — COMPREHENSIVE METABOLIC PANEL
ALT: 13 U/L (ref 0–44)
AST: 17 U/L (ref 15–41)
Albumin: 3.3 g/dL — ABNORMAL LOW (ref 3.5–5.0)
Alkaline Phosphatase: 93 U/L (ref 38–126)
Anion gap: 12 (ref 5–15)
BUN: 20 mg/dL (ref 8–23)
CO2: 22 mmol/L (ref 22–32)
Calcium: 9.4 mg/dL (ref 8.9–10.3)
Chloride: 109 mmol/L (ref 98–111)
Creatinine, Ser: 1.25 mg/dL — ABNORMAL HIGH (ref 0.44–1.00)
GFR, Estimated: 44 mL/min — ABNORMAL LOW (ref >60)
Glucose, Bld: 127 mg/dL — ABNORMAL HIGH (ref 70–99)
Potassium: 3.6 mmol/L (ref 3.5–5.1)
Sodium: 143 mmol/L (ref 135–145)
Total Bilirubin: 1.9 mg/dL — ABNORMAL HIGH (ref 0.3–1.2)
Total Protein: 6.7 g/dL (ref 6.5–8.1)

## 2022-05-03 LAB — I-STAT VENOUS BLOOD GAS, ED
Acid-Base Excess: 1 mmol/L (ref 0.0–2.0)
Bicarbonate: 21.3 mmol/L (ref 20.0–28.0)
Calcium, Ion: 0.73 mmol/L — CL (ref 1.15–1.40)
HCT: 47 % — ABNORMAL HIGH (ref 36.0–46.0)
Hemoglobin: 16 g/dL — ABNORMAL HIGH (ref 12.0–15.0)
O2 Saturation: 100 %
Potassium: 6.5 mmol/L (ref 3.5–5.1)
Sodium: 137 mmol/L (ref 135–145)
TCO2: 22 mmol/L (ref 22–32)
pCO2, Ven: 24.2 mmHg — ABNORMAL LOW (ref 44–60)
pH, Ven: 7.552 — ABNORMAL HIGH (ref 7.25–7.43)
pO2, Ven: 159 mmHg — ABNORMAL HIGH (ref 32–45)

## 2022-05-03 LAB — CBC WITH DIFFERENTIAL/PLATELET
Abs Immature Granulocytes: 0.03 10*3/uL (ref 0.00–0.07)
Basophils Absolute: 0.1 10*3/uL (ref 0.0–0.1)
Basophils Relative: 1 %
Eosinophils Absolute: 0.1 10*3/uL (ref 0.0–0.5)
Eosinophils Relative: 1 %
HCT: 48 % — ABNORMAL HIGH (ref 36.0–46.0)
Hemoglobin: 15.1 g/dL — ABNORMAL HIGH (ref 12.0–15.0)
Immature Granulocytes: 0 %
Lymphocytes Relative: 21 %
Lymphs Abs: 1.6 10*3/uL (ref 0.7–4.0)
MCH: 29.1 pg (ref 26.0–34.0)
MCHC: 31.5 g/dL (ref 30.0–36.0)
MCV: 92.5 fL (ref 80.0–100.0)
Monocytes Absolute: 0.6 10*3/uL (ref 0.1–1.0)
Monocytes Relative: 8 %
Neutro Abs: 5.3 10*3/uL (ref 1.7–7.7)
Neutrophils Relative %: 69 %
Platelets: 122 10*3/uL — ABNORMAL LOW (ref 150–400)
RBC: 5.19 MIL/uL — ABNORMAL HIGH (ref 3.87–5.11)
RDW: 16.9 % — ABNORMAL HIGH (ref 11.5–15.5)
WBC: 7.6 10*3/uL (ref 4.0–10.5)
nRBC: 0 % (ref 0.0–0.2)

## 2022-05-03 LAB — TROPONIN I (HIGH SENSITIVITY)
Troponin I (High Sensitivity): 29 ng/L — ABNORMAL HIGH (ref <18)
Troponin I (High Sensitivity): 25 ng/L — ABNORMAL HIGH (ref <18)

## 2022-05-03 LAB — BRAIN NATRIURETIC PEPTIDE: B Natriuretic Peptide: 684 pg/mL — ABNORMAL HIGH (ref 0.0–100.0)

## 2022-05-03 LAB — RESP PANEL BY RT-PCR (FLU A&B, COVID) ARPGX2
Influenza A by PCR: NEGATIVE
Influenza B by PCR: NEGATIVE
SARS Coronavirus 2 by RT PCR: NEGATIVE

## 2022-05-03 MED ORDER — IPRATROPIUM BROMIDE 0.02 % IN SOLN
0.5000 mg | Freq: Once | RESPIRATORY_TRACT | Status: AC
  Administered 2022-05-03: 0.5 mg via RESPIRATORY_TRACT
  Filled 2022-05-03: qty 2.5

## 2022-05-03 MED ORDER — ALBUTEROL SULFATE (2.5 MG/3ML) 0.083% IN NEBU
2.5000 mg | INHALATION_SOLUTION | Freq: Once | RESPIRATORY_TRACT | Status: AC
  Administered 2022-05-03: 2.5 mg via RESPIRATORY_TRACT
  Filled 2022-05-03: qty 3

## 2022-05-03 NOTE — Discharge Instructions (Addendum)
Your chest pain could be related to your breathing as well as the issues with your feeding tube. Thankfully this has resolved. Please follow up with your primary care doctor in 1-2 weeks.   If you develop persistent chest pain that does not resolve, worsening shortness of breath, or your chest pain changes please come to the emergency department.

## 2022-05-03 NOTE — ED Provider Triage Note (Signed)
Emergency Medicine Provider Triage Evaluation Note  [name] [name] , a 76 y.o. female  was evaluated in triage.  Pt complains of chest pain.  Patient states that she woke up this morning and had central sharp chest pain that she states radiated to the right side of her chest.  She has had associated shortness of breath and palpitations.  She has had mild nausea.  She denies any diaphoresis, syncope..  Note she has significant cardiac history of CAD s/p CABG, combined systolic and diastolic heart failure, atrial fibrillation, cardiac arrest.  Appears that she has pacemaker placement and is currently seeing cardiology for possible biv ICD upgrade. Review of Systems  Positive: See above Negative:   Physical Exam  BP (!) 155/102 (BP Location: Right Arm)   Pulse 68   Temp 97.8 F (36.6 C) (Oral)   Resp 16   SpO2 93%  Gen:   Awake, appears tired on exam Resp:  Normal effort, slightly tachypneic MSK:   Moves extremities without difficulty  Other:  S1/S2 without murmur  Medical Decision Making  Medically screening exam initiated at 9:46 AM.  Appropriate orders placed.  JAHYRA SUKUP was informed that the remainder of the evaluation will be completed by another provider, this initial triage assessment does not replace that evaluation, and the importance of remaining in the ED until their evaluation is complete.     Mickie Hillier, PA-C 05/03/22 [phone]

## 2022-05-03 NOTE — ED Provider Notes (Addendum)
[facility] EMERGENCY DEPARTMENT Provider Note   CSN: [id number] Arrival date & time: 05/03/22  0911     History Past Medical History:  Diagnosis Date   (HFimpEF) heart failure with improved ejection fraction ([place])    a. 07/2018 Echo: EF 30-35%; b. 11/2019 Echo: EF 55-60%, no rwma, Gr2 DD, Nl RV size/fxn. Mild BAE. Mild MR/AI.   Arthritis    Asthma    Atopic dermatitis    CAD (coronary artery disease)    a. 2016 s/p CABG x 2 (LIMA->LAD, VG->OM); b. 08/2018 MV: EF 44%, no ischemia/infact.   Cardiac arrest ([place]) 10/2014   Cardiomyopathy, ischemic    a. 07/2018 Echo: EF 30-35%; 11/2019 Echo: EF 55-60%.   Carotid arterial disease ([place])    a. 11/2019 Carotid U/S   CHF (congestive heart failure) (HCC)    CKD (chronic kidney disease), stage III (HCC)    COPD (chronic obstructive pulmonary disease) (HCC)    Diabetes mellitus without complication ([place])    Dysrhythmia    Esophageal dilatation 2013   GERD (gastroesophageal reflux disease)    Gout    Headache    Hypertension    Hypokalemia    Idiopathic angioedema    LGI bleed 08/06/2017   a. felt to be hemorrhoidal during that admission (no drop in Hgb).   Lower back pain    Paroxysmal atrial fibrillation (HCC)    a. Dx 2016-->h/o difficult to control rates (complicated by noncompliance), not felt to be a candidate for ablation or antiarrhythmic due to noncompliance; b. Recurrent AF 2021 - converted w/ IV dilt; c. CHA2DS2VASc = 7-->Xarelto.   Personal history of noncompliance with medical treatment, presenting hazards to health    Prediabetes    Presence of permanent cardiac pacemaker    S/P CABG x 2 with clipping of LA appendage 11/14/2014   LIMA to LAD, SVG to OM, EVH via right thigh   Urine incontinence    Uterine fibroid     Chief Complaint  Patient presents with   Chest Pain    [name] [name] is a 76 y.o. female.  Patient has an extensive PMH including HFrEF (25-30%), COPD, pAFIB, CAD s/p 2v CABG who  presents with chest discomfort and SOB.   Patient states that beginning the day prior to to presenting to the ED she developed some chest discomfort after initially having some trouble swallowing her saliva after eating a meal heavy with meat protein. She states that she eventually started having some sharp pain in the L side of her chest that started in her epigastrium. The episode would last for 2-3 min and resolve without any intervention. She does not describe any other provoking factors including activity or positional changes. At bedside she states that her pain has resolved. She does note that she has had some difficulty with breathing and some wheezing and that this is her primary issue currently. She denies fever, cough, chills, dysuria, diarrhea or constipation.     Chest Pain      Home Medications Prior to Admission medications   Medication Sig Start Date End Date Taking? Authorizing Provider  acetaminophen (TYLENOL) 325 MG tablet Take 2 tablets (650 mg total) by mouth every 4 (four) hours as needed for headache or mild pain. 12/09/20   Mikhail, Velta Addison, DO  albuterol (VENTOLIN HFA) 108 (90 Base) MCG/ACT inhaler Inhale 1-2 puffs into the lungs every 6 (six) hours as needed for wheezing or shortness of breath. 03/20/22   Rising, Wells Guiles, PA-C  allopurinol (ZYLOPRIM) 100 MG tablet Take 100 mg by mouth daily. 07/19/21   [provider]  atorvastatin (LIPITOR) 10 MG tablet Take 1 tablet (10 mg total) by mouth at bedtime. 03/17/22   Janith Lima, MD  azithromycin (ZITHROMAX) 250 MG tablet Take 1 tablet (250 mg total) by mouth daily. Take first 2 tablets together, then 1 every day until finished. 03/14/22   White, Leitha Schuller, NP  dextromethorphan-guaiFENesin (MUCINEX DM) 30-600 MG 12hr tablet Take 1 tablet by mouth 2 (two) times daily. Take 1/2 tablet every 12 hours for cough. 02/05/22   Nyoka Lint, PA-C  Fluticasone-Umeclidin-Vilant (TRELEGY ELLIPTA) 100-62.5-25 MCG/ACT AEPB Inhale 1  puff into the lungs daily. 03/16/22   Janith Lima, MD  ipratropium-albuterol (DUONEB) 0.5-2.5 (3) MG/3ML SOLN Take 3 mLs by nebulization every 6 (six) hours as needed. 03/20/22   Rising, Wells Guiles, PA-C  losartan (COZAAR) 50 MG tablet Take 1 tablet (50 mg total) by mouth daily. 12/22/21   Bensimhon, Shaune Pascal, MD  metFORMIN (GLUCOPHAGE) 500 MG tablet Take 500 mg by mouth daily with breakfast. Daily with a meal    [provider]  metoprolol succinate (TOPROL-XL) 100 MG 24 hr tablet Take 1 tablet (100 mg total) by mouth daily. Take with or immediately following a meal. 04/30/21 07/30/22  Evans Lance, MD  pantoprazole (PROTONIX) 40 MG tablet Take 1 tablet (40 mg total) by mouth daily. 04/14/22   Vanessa Kick, MD  Prednisol Ace-Moxiflox-Bromfen 1-0.5-0.075 % SUSP Place 1 drop into the right eye 4 (four) times daily. 05/04/21   [provider]  rivaroxaban (XARELTO) 20 MG TABS tablet Take 1 tablet (20 mg total) by mouth daily with supper. Resume from 11/01/2019 11/01/19   Aline August, MD  spironolactone (ALDACTONE) 25 MG tablet Take 1 tablet (25 mg total) by mouth daily. 06/21/21   Rafael Bihari, FNP  torsemide (DEMADEX) 20 MG tablet Take 20 mg by mouth daily.    [provider]      Allergies    Bee venom, Ivp dye [iodinated contrast media], Ace inhibitors, Atenolol, Codeine, Entresto [sacubitril-valsartan], Penicillin g, Shrimp [shellfish allergy], Simvastatin, and Jardiance [empagliflozin]    Review of Systems   Review of Systems  Cardiovascular:  Positive for chest pain.  All other systems reviewed and are negative.   Physical Exam Updated Vital Signs BP (!) 166/94   Pulse 70   Temp 97.8 F (36.6 C) (Oral)   Resp (!) 23   Ht '5\' 7"'$  (1.702 m)   Wt 87.1 kg   SpO2 97%   BMI 30.07 kg/m  Physical Exam Constitutional:      General: She is not in acute distress.    Appearance: She is well-developed. She is not diaphoretic.     Comments: Drowsy   HENT:      Head: Atraumatic.  Cardiovascular:     Comments: Paced  Pulmonary:     Effort: Pulmonary effort is normal.     Breath sounds: Examination of the right-upper field reveals wheezing. Examination of the left-upper field reveals wheezing. Examination of the right-middle field reveals wheezing. Examination of the left-middle field reveals wheezing. Wheezing present. No rales.  Chest:     Chest wall: No tenderness.  Abdominal:     General: There is no abdominal bruit.     Palpations: Abdomen is soft.  Musculoskeletal:     Right lower leg: Edema present.     Left lower leg: Edema present.  Psychiatric:  Mood and Affect: Mood normal.     ED Results / Procedures / Treatments   Labs (all labs ordered are listed, but only abnormal results are displayed) Labs Reviewed  COMPREHENSIVE METABOLIC PANEL - Abnormal; Notable for the following components:      Result Value   Glucose, Bld 127 (*)    Creatinine, Ser 1.25 (*)    Albumin 3.3 (*)    Total Bilirubin 1.9 (*)    GFR, Estimated 44 (*)    All other components within normal limits  BRAIN NATRIURETIC PEPTIDE - Abnormal; Notable for the following components:   B Natriuretic Peptide 684.0 (*)    All other components within normal limits  CBC WITH DIFFERENTIAL/PLATELET - Abnormal; Notable for the following components:   RBC 5.19 (*)    Hemoglobin 15.1 (*)    HCT 48.0 (*)    RDW 16.9 (*)    Platelets 122 (*)    All other components within normal limits  TROPONIN I (HIGH SENSITIVITY) - Abnormal; Notable for the following components:   Troponin I (High Sensitivity) 29 (*)    All other components within normal limits  RESP PANEL BY RT-PCR (FLU A&B, COVID) ARPGX2  I-STAT VENOUS BLOOD GAS, ED  TROPONIN I (HIGH SENSITIVITY)    EKG EKG Interpretation  Date/Time:  Tuesday May 03 2022 09:37:52 EDT Ventricular Rate:  78 PR Interval:  152 QRS Duration: 98 QT Interval:  398 QTC Calculation: 453 R Axis:   29 Text  Interpretation: Marked sinus bradycardia with frequent ventricular-paced complexes and with occasional Premature ventricular complexes and Premature atrial complexes Minimal voltage criteria for LVH, may be normal variant ( Cornell product ) ST & T wave abnormality, consider inferolateral ischemia Abnormal ECG No significant change since last tracing Confirmed by Deno Etienne [id number]) on 05/03/2022 11:06:30 AM  Radiology DG Chest 2 View  Result Date: 05/03/2022 CLINICAL DATA:  cp, sob EXAM: CHEST - 2 VIEW COMPARISON:  Chest x-ray 03/05/2022. FINDINGS: Similar enlarged cardiac silhouette. Pulmonary vascular congestion. Left subclavian approach cardiac rhythm maintenance device. Median sternotomy. Left atrial appendage clip. Streaky opacities in the left midlung and right lung base are similar. IMPRESSION: 1. Similar cardiomegaly and pulmonary vascular congestion. 2. Streaky opacities in the left midlung and right lung base are similar and probably represent atelectasis/scarring. Electronically Signed   By: Margaretha Sheffield M.D.   On: 05/03/2022 10:21    Procedures Procedures    Medications Ordered in ED Medications  albuterol (PROVENTIL) (2.5 MG/3ML) 0.083% nebulizer solution 2.5 mg (2.5 mg Nebulization Given 05/03/22 1143)  ipratropium (ATROVENT) nebulizer solution 0.5 mg (0.5 mg Nebulization Given 05/03/22 1216)    ED Course/ Medical Decision Making/ A&P                           Medical Decision Making [name] [name] is a 76yo F with a PMH of CAD s/p 2v CABG, COPD, and known schatzki ring who presented for intermittent sharp chest discomfort after initially having difficulty and pain with swallowing.   Unclear exact cause of patient's symptoms, cardiac etiology of her pain seems less likely. Her troponins are flat and likely mildly elevated in the setting of her CKD. She has no acute changes of her EKG. This does not however precluded unstable angina given her cardiac history. This seems likely  however given the temporal relationship with her dysphagia/odynophagia symptoms. At this time, this is felt to be the mostly likely culprit of her  symptoms. There could also be a pulmonary component given she has some wheezing on exam.  -Will follow up patient's repeat troponin  -Duonebs -Recommend that she completely refrain from tobacco use  -Follow up with her primary care, for possible referral to GI doctor regarding her dysphagia   Troponins were flat.  Patient will discharge from the ED and will recommend that she follow up with her primary care doctor in 1-2 weeks.   Risk Prescription drug management.    Final Clinical Impression(s) / ED Diagnoses Final diagnoses:  None    Rx / DC Orders ED Discharge Orders     None         Rick Duff, MD 05/03/22 [place], [place], MD 05/03/22 [place], DO 05/03/22 1402

## 2022-05-03 NOTE — ED Triage Notes (Signed)
Pt. Stated, I started having chest pain this morning , it was a 10 now I dont have any pain

## 2022-05-07 ENCOUNTER — Other Ambulatory Visit: Payer: Self-pay | Admitting: Internal Medicine

## 2022-05-10 ENCOUNTER — Ambulatory Visit: Payer: HMO | Admitting: Student

## 2022-05-10 DIAGNOSIS — I5042 Chronic combined systolic (congestive) and diastolic (congestive) heart failure: Secondary | ICD-10-CM

## 2022-05-10 DIAGNOSIS — I4819 Other persistent atrial fibrillation: Secondary | ICD-10-CM

## 2022-05-10 DIAGNOSIS — I214 Non-ST elevation (NSTEMI) myocardial infarction: Secondary | ICD-10-CM

## 2022-05-10 NOTE — Progress Notes (Signed)
Remote pacemaker transmission.   

## 2022-05-17 ENCOUNTER — Telehealth: Payer: Self-pay | Admitting: Internal Medicine

## 2022-05-17 NOTE — Progress Notes (Unsigned)
Cardiology Office Note Date:  05/17/2022  Patient ID:  [name] [name] 1946-05-13, MRN [id number] PCP:  Janith Lima, MD  Cardiologist:  [name] Electrophysiologist: Dr. Lovena Le     Chief Complaint:  post hospital  History of Present Illness: [name] is a 76 y.o. female with history of CAD (CABG 2016), COPD, CKD (III), ICM w/improved LVEF >> chronic diastolic CHF, HTN, HLD, longstanding persistent (?permanent) AFib >> AV node ablation w/PPM.  She saw [name] 12/22/21, LVEF 25-30% Moderate RV dysfunction. Severe biatrial enlargement. Severe central MR/TR, also discussed her lung disease playing a major part in her limitations. Had concerns of RV pacing and planned to have her see Dr. Lovena Le to discuss perhaps CRT. Also discussed perhaps mTEER for her VHD, but she was not interested at the time of her visit  She saw Dr. Lovena Le  02/22/22, to discuss perhaps upgrade of her device to CRT, her daughter in [place] on the phone, described the patient as quite anxious.  Despite rate control with AVN ablation and LB area pacing she continued with class II symptoms. Discussed options, while he suspected gaining/maintaining SR would be difficult, discussed tikosyn vs amiodarone in effort to get AV conduction and then device upgrade. She was going to give it some thought. He also mentioned some degree of noncompliance adding difficulty to managing her care/symptoms.  03/05/22 ER visit for SOB O2 saturation 99% on room air, breathing appears to be labored while at rest during exam however lungs are clear to auscultation, chest x-ray negative for acute findings, showing trace pleural effusions, discussed with patient, DuoNeb and methylprednisolone injection given in office and on reevaluation patient endorses that she feels somewhat better breathing, prescribed cefdinir for bacterial coverage and prednisone 40 mg burst for outpatient support  03/20/22 ER visit for SOB  COPD exacerbation  shortness of breath Lung sounds significantly decreased to the bilateral upper lung fields upon initial assessment.  Patient given 60 mg Solu-Medrol injection IM as well as DuoNeb treatment in the clinic with significant improvement of lung sounds upon reassessment.   She was without a rescue inhaler and denied having neb machine at home, seems her PMD had recently adjusted her daily management but had not yet made that change She was given pulm referral and a nebulizer machine and solution and an Rx for albuterol  04/14/22 ER/UCC visit for rouble swallowing Suspect esophageal stricture and given protonix  04/23/22 ER/UCC visit for SOB and nasal congestions given Duoneb with improvement  05/03/22 ER for CP trouble swallowing > CP [place] though mildly elevated felt 2/2 CKD Symptoms most likely 2/2 her dysphagia/odynophagia Given neb treatment as well, rec GI eval  BNP was 684 (down from 1166)  K+ 6.5  *** has she seen pulm yet? *** volume *** labs, lytes *** CM meds with HF team *** xarelto, dose, bleeding *** BMET   Device information: Biotronik single chamber PPM implanted 01/18/2021 RV lead by implant report is in LB/septal position S/p AV node ablation   Past Medical History:  Diagnosis Date   ([place]) heart failure with improved ejection fraction ([place])    a. 07/2018 Echo: EF 30-35%; b. 11/2019 Echo: EF 55-60%, no rwma, Gr2 DD, Nl RV size/fxn. Mild BAE. Mild MR/AI.   Arthritis    Asthma    Atopic dermatitis    CAD (coronary artery disease)    a. 2016 s/p CABG x 2 (LIMA->LAD, VG->OM); b. 08/2018 MV: EF 44%, no ischemia/infact.  Cardiac arrest ([place]) 10/2014   Cardiomyopathy, ischemic    a. 07/2018 Echo: EF 30-35%; 11/2019 Echo: EF 55-60%.   Carotid arterial disease ([place])    a. 11/2019 Carotid U/S   CHF (congestive heart failure) (HCC)    CKD (chronic kidney disease), stage III (HCC)    COPD (chronic obstructive pulmonary disease) (HCC)    Diabetes mellitus without  complication ([place])    Dysrhythmia    Esophageal dilatation 2013   GERD (gastroesophageal reflux disease)    Gout    Headache    Hypertension    Hypokalemia    Idiopathic angioedema    LGI bleed 08/06/2017   a. felt to be hemorrhoidal during that admission (no drop in Hgb).   Lower back pain    Paroxysmal atrial fibrillation (HCC)    a. Dx 2016-->h/o difficult to control rates (complicated by noncompliance), not felt to be a candidate for ablation or antiarrhythmic due to noncompliance; b. Recurrent AF 2021 - converted w/ IV dilt; c. CHA2DS2VASc = 7-->Xarelto.   Personal history of noncompliance with medical treatment, presenting hazards to health    Prediabetes    Presence of permanent cardiac pacemaker    S/P CABG x 2 with clipping of LA appendage 11/14/2014   LIMA to LAD, SVG to OM, EVH via right thigh   Urine incontinence    Uterine fibroid     Past Surgical History:  Procedure Laterality Date   AV NODE ABLATION N/A 01/18/2021   Procedure: AV NODE ABLATION;  Surgeon: Evans Lance, MD;  Location: [place] [facility];  Service: Cardiovascular;  Laterality: N/A;   BALLOON DILATION N/A 10/10/2019   Procedure: BALLOON DILATION;  Surgeon: Otis Brace, MD;  Location: [facility];  Service: Gastroenterology;  Laterality: N/A;   BIOPSY  10/10/2019   Procedure: BIOPSY;  Surgeon: Otis Brace, MD;  Location: [name];  Service: Gastroenterology;;   BIOPSY  10/11/2019   Procedure: BIOPSY;  Surgeon: Otis Brace, MD;  Location: [place];  Service: Gastroenterology;;   CARDIOVERSION N/A 11/18/2014   Procedure: CARDIOVERSION;  Surgeon: [name], MD;  Location: [place];  Service: Cardiovascular;  Laterality: N/A;   CARDIOVERSION N/A 12/25/2017   Procedure: CARDIOVERSION;  Surgeon: [name], MD;  Location: [place];  Service: Cardiovascular;  Laterality: N/A;   CLIPPING OF ATRIAL APPENDAGE N/A 11/14/2014   Procedure: CLIPPING OF ATRIAL APPENDAGE;   Surgeon: [name], MD;  Location: [facility];  Service: Open Heart Surgery;  Laterality: N/A;   COLONOSCOPY  2013   COLONOSCOPY WITH PROPOFOL N/A 10/11/2019   Procedure: COLONOSCOPY WITH PROPOFOL;  Surgeon: Otis Brace, MD;  Location: [place];  Service: Gastroenterology;  Laterality: N/A;   CORONARY ARTERY BYPASS GRAFT N/A 11/14/2014   Procedure: CORONARY ARTERY BYPASS GRAFTING (CABG)TIMES 2 USING LEFT INTERNAL MAMMARY ARTERY AND RIGHT SAPHENOUS VEIN HARVESTED ENDOSCOPICALLY;  Surgeon: [name], MD;  Location: [facility];  Service: Open Heart Surgery;  Laterality: N/A;   ESOPHAGOGASTRODUODENOSCOPY (EGD) WITH PROPOFOL N/A 10/10/2019   Procedure: ESOPHAGOGASTRODUODENOSCOPY (EGD) WITH PROPOFOL;  Surgeon: Otis Brace, MD;  Location: [place];  Service: Gastroenterology;  Laterality: N/A;   ESOPHAGOGASTRODUODENOSCOPY (EGD) WITH PROPOFOL N/A 10/27/2019   Procedure: ESOPHAGOGASTRODUODENOSCOPY (EGD) WITH PROPOFOL;  Surgeon: Ronnette Juniper, MD;  Location: [place] [facility];  Service: Gastroenterology;  Laterality: N/A;   GIVENS CAPSULE STUDY N/A 10/27/2019   Procedure: GIVENS CAPSULE STUDY;  Surgeon: Ronnette Juniper, MD;  Location: [place];  Service: Gastroenterology;  Laterality: N/A;   INSERT /  REPLACE / REMOVE PACEMAKER     LEFT HEART CATHETERIZATION WITH CORONARY ANGIOGRAM N/A 11/04/2014   Procedure: LEFT HEART CATHETERIZATION WITH CORONARY ANGIOGRAM;  Surgeon: Troy Sine, MD;  Location: [facility];  Service: Cardiovascular;  Laterality: N/A;   PACEMAKER IMPLANT N/A 01/18/2021   Procedure: PACEMAKER IMPLANT;  Surgeon: Evans Lance, MD;  Location: [place];  Service: Cardiovascular;  Laterality: N/A;   PACEMAKER IMPLANT Left    POLYPECTOMY  10/11/2019   Procedure: POLYPECTOMY;  Surgeon: Otis Brace, MD;  Location: [facility];  Service: Gastroenterology;;   TEE WITHOUT CARDIOVERSION N/A 11/14/2014   Procedure: TRANSESOPHAGEAL ECHOCARDIOGRAM (TEE);  Surgeon:  [name], MD;  Location: [place];  Service: Open Heart Surgery;  Laterality: N/A;   TEE WITHOUT CARDIOVERSION N/A 12/25/2017   Procedure: TRANSESOPHAGEAL ECHOCARDIOGRAM (TEE);  Surgeon: [name], MD;  Location: [facility] ENDOSCOPY;  Service: Cardiovascular;  Laterality: N/A;   TEMPORARY PACEMAKER INSERTION  11/04/2014   Procedure: TEMPORARY PACEMAKER INSERTION;  Surgeon: Troy Sine, MD;  Location[facility] LAB;  Service: Cardiovascular;;   TOOTH EXTRACTION      Current Outpatient Medications  Medication Sig Dispense Refill   acetaminophen (TYLENOL) 325 MG tablet Take 2 tablets (650 mg total) by mouth every 4 (four) hours as needed for headache or mild pain.     albuterol (VENTOLIN HFA) 108 (90 Base) MCG/ACT inhaler Inhale 1-2 puffs into the lungs every 6 (six) hours as needed for wheezing or shortness of breath. 18 g 2   allopurinol (ZYLOPRIM) 100 MG tablet Take 100 mg by mouth daily.     atorvastatin (LIPITOR) 10 MG tablet Take 1 tablet (10 mg total) by mouth at bedtime. 90 tablet 1   azithromycin (ZITHROMAX) 250 MG tablet Take 1 tablet (250 mg total) by mouth daily. Take first 2 tablets together, then 1 every day until finished. 6 tablet 0   dextromethorphan-guaiFENesin (MUCINEX DM) 30-600 MG 12hr tablet Take 1 tablet by mouth 2 (two) times daily. Take 1/2 tablet every 12 hours for cough. 14 tablet 0   Fluticasone-Umeclidin-Vilant (TRELEGY ELLIPTA) 100-62.5-25 MCG/ACT AEPB Inhale 1 puff into the lungs daily. 120 each 1   ipratropium-albuterol (DUONEB) 0.5-2.5 (3) MG/3ML SOLN Take 3 mLs by nebulization every 6 (six) hours as needed. 360 mL 1   losartan (COZAAR) 50 MG tablet Take 1 tablet (50 mg total) by mouth daily. 30 tablet 6   metFORMIN (GLUCOPHAGE) 500 MG tablet Take 500 mg by mouth daily with breakfast. Daily with a meal     metoprolol succinate (TOPROL-XL) 100 MG 24 hr tablet TAKE ONE TABLET BY MOUTH EVERY MORNING with OR immediately following A meal 90 tablet 3    pantoprazole (PROTONIX) 40 MG tablet Take 1 tablet (40 mg total) by mouth daily. 30 tablet 2   Prednisol Ace-Moxiflox-Bromfen 1-0.5-0.075 % SUSP Place 1 drop into the right eye 4 (four) times daily.     rivaroxaban (XARELTO) 20 MG TABS tablet Take 1 tablet (20 mg total) by mouth daily with supper. Resume from 11/01/2019     spironolactone (ALDACTONE) 25 MG tablet Take 1 tablet (25 mg total) by mouth daily. 30 tablet 11   torsemide (DEMADEX) 20 MG tablet Take 20 mg by mouth daily.     No current facility-administered medications for this visit.    Allergies:   Bee venom, Ivp dye [iodinated contrast media], Ace inhibitors, Atenolol, Codeine, Entresto [sacubitril-valsartan], Penicillin g, Shrimp [shellfish allergy], Simvastatin, and Jardiance [empagliflozin]   Social  History:  The patient  reports that she quit smoking about 6 months ago. Her smoking use included cigarettes. She has a 28.00 pack-year smoking history. She has never used smokeless tobacco. She reports that she does not drink alcohol and does not use drugs.   Family History:  [name] family history includes CVA in her sister; Cancer in her mother; Diabetes in her brother; Heart disease in her father; Other in her father; Prostate cancer (age of onset: 52) in her brother.  ROS:  Please see the history of present illness.    All other systems are reviewed and otherwise negative.   PHYSICAL EXAM:  VS:  There were no vitals taken for this visit. BMI: There is no height or weight on file to calculate BMI. Well nourished, well developed, in no acute distress HEENT: normocephalic, atraumatic Neck: no JVD, carotid bruits or masses Cardiac:  ***; no significant murmurs, no rubs, or gallops Lungs:  *** CTA b/l, no wheezing, I do not appreciate any rhonchi or rales, no crackles Abd: soft, nontender MS: no deformity or atrophy Ext: *** no edema Skin: warm and dry, no rash Neuro:  No gross deficits appreciated Psych: euthymic mood,  full affect  PPM site: ***   EKG:  not done today  Device check done today and reviewed by myself: ***  07/30/21: TTE 1. Left ventricular ejection fraction, by estimation, is 25 to 30%. The  left ventricle has severely decreased function. The left ventricle has no  regional wall motion abnormalities. There is moderate concentric left  ventricular hypertrophy. Left  ventricular diastolic parameters are indeterminate.   2. Right ventricular systolic function is moderately reduced. The right  ventricular size is moderately enlarged. There is mildly elevated  pulmonary artery systolic pressure.   3. Left atrial size was severely dilated.   4. Right atrial size was severely dilated.   5. The mitral valve is normal in structure. Severe mitral valve  regurgitation. No evidence of mitral stenosis.   6. Tricuspid valve regurgitation is severe.   7. Appears functionally bicuspid. The aortic valve was not well  visualized. There is moderate thickening of the aortic valve. Aortic valve  regurgitation is trivial. Aortic valve sclerosis/calcification is present,  without any evidence of aortic  stenosis.   8. The inferior vena cava is normal in size with greater than 50%  respiratory variability, suggesting right atrial pressure of 3 mmHg.     06/27/2020: TTE IMPRESSIONS   1. Global hypokinesis with akinesis of the inferior and inferolateral  wall.   2. Left ventricular ejection fraction, by estimation, is 30 to 35%. The  left ventricle has moderate to severely decreased function. The left  ventricle demonstrates regional wall motion abnormalities (see scoring  diagram/findings for description). There  is moderate left ventricular hypertrophy. Left ventricular diastolic  function could not be evaluated.   3. Right ventricular systolic function is normal. The right ventricular  size is normal. There is normal pulmonary artery systolic pressure.   4. Left atrial size was severely  dilated.   5. Right atrial size was moderately dilated.   6. The mitral valve is normal in structure. Mild mitral valve  regurgitation. No evidence of mitral stenosis.   7. The aortic valve is tricuspid. Aortic valve regurgitation is trivial.  Mild to moderate aortic valve sclerosis/calcification is present, without  any evidence of aortic stenosis.   8. The inferior vena cava is dilated in size with >50% respiratory  variability, suggesting right atrial  pressure of 8 mmHg.    12/02/2019: TTE IMPRESSIONS   1. Left ventricular ejection fraction, by estimation, is 55 to 60%. The  left ventricle has normal function. The left ventricle demonstrates  regional wall motion abnormalities (see scoring diagram/findings for  description). There is moderate left  ventricular hypertrophy. Left ventricular diastolic parameters are  consistent with Grade II diastolic dysfunction (pseudonormalization).  Elevated left ventricular end-diastolic pressure.   2. Right ventricular systolic function is normal. The right ventricular  size is normal. Tricuspid regurgitation signal is inadequate for assessing  PA pressure.   3. Left atrial size was mildly dilated.   4. Right atrial size was mildly dilated.   5. The mitral valve is abnormal. Mild mitral valve regurgitation. No  evidence of mitral stenosis.   6. The aortic valve is abnormal. Aortic valve regurgitation is mild. Mild  aortic valve sclerosis is present, with no evidence of aortic valve  stenosis.   7. The inferior vena cava is dilated in size with >50% respiratory  variability, suggesting right atrial pressure of 8 mmHg.   LV Wall Scoring:  Hypokinesis of the lateral wall from base to mid ventricle.   10/10/19: LVEF 60-65%, no WMA   09/07/2018: stress myoview Nuclear stress EF: 44%. There was no ST segment deviation noted during stress. The study is normal. This is a low risk study. The left ventricular ejection fraction is moderately  decreased (30-44%).   08/03/2018: LVEF 30-35%, no WMA    Recent Labs: 06/10/2021: Magnesium 1.8 03/16/2022: TSH 1.42 05/03/2022: ALT 13; B Natriuretic Peptide 684.0; BUN 20; Creatinine, Ser 1.25; Hemoglobin 16.0; Platelets 122; Potassium 6.5; Sodium 137  03/16/2022: Cholesterol 109; HDL 30.20; LDL Cholesterol 54; Total CHOL/HDL Ratio 4; Triglycerides 123.0; VLDL 24.6   Estimated Creatinine Clearance: 42.7 mL/min (A) (by C-G formula based on SCr of 1.25 mg/dL (H)).   Wt Readings from Last 3 Encounters:  05/03/22 192 lb (87.1 kg)  04/28/22 192 lb 3.9 oz (87.2 kg)  04/14/22 189 lb (85.7 kg)     Other studies reviewed: Additional studies/records reviewed today include: summarized above  ASSESSMENT AND PLAN:  1. permananet Afib     CHA2DS2Vasc is 6, on Xarelto,  *** appropriately dosed      ***      2. CAD     ***     C/w [name]  3. Mixed ischemic/NICM 4. chronic CHF     LVEF is back down     ***  5. HTN     ***   Disposition: ***.  Current medicines are reviewed at length with the patient today.  The patient did not have any concerns regarding medicines.  Venetia Night, PA-C 05/17/2022 8:45 AM     [facility] [address] [place] [address] [phone] (office)  [phone] (fax)

## 2022-05-17 NOTE — Telephone Encounter (Signed)
N/A when calling patient to schedule AWV-I with [facility].

## 2022-05-18 ENCOUNTER — Telehealth: Payer: Self-pay | Admitting: Physician Assistant

## 2022-05-18 ENCOUNTER — Ambulatory Visit: Payer: HMO | Attending: Student | Admitting: Physician Assistant

## 2022-05-18 ENCOUNTER — Encounter: Payer: Self-pay | Admitting: Physician Assistant

## 2022-05-18 VITALS — BP 136/80 | HR 70 | Ht 67.5 in | Wt 188.0 lb

## 2022-05-18 DIAGNOSIS — I4811 Longstanding persistent atrial fibrillation: Secondary | ICD-10-CM

## 2022-05-18 DIAGNOSIS — Z79899 Other long term (current) drug therapy: Secondary | ICD-10-CM | POA: Diagnosis not present

## 2022-05-18 DIAGNOSIS — Z950 Presence of cardiac pacemaker: Secondary | ICD-10-CM

## 2022-05-18 DIAGNOSIS — I340 Nonrheumatic mitral (valve) insufficiency: Secondary | ICD-10-CM

## 2022-05-18 DIAGNOSIS — I428 Other cardiomyopathies: Secondary | ICD-10-CM | POA: Diagnosis not present

## 2022-05-18 DIAGNOSIS — I5022 Chronic systolic (congestive) heart failure: Secondary | ICD-10-CM

## 2022-05-18 LAB — BASIC METABOLIC PANEL
BUN/Creatinine Ratio: 26 (ref 12–28)
BUN: 30 mg/dL — ABNORMAL HIGH (ref 8–27)
CO2: 21 mmol/L (ref 20–29)
Calcium: 9.5 mg/dL (ref 8.7–10.3)
Chloride: 111 mmol/L — ABNORMAL HIGH (ref 96–106)
Creatinine, Ser: 1.17 mg/dL — ABNORMAL HIGH (ref 0.57–1.00)
Glucose: 120 mg/dL — ABNORMAL HIGH (ref 70–99)
Potassium: 3.9 mmol/L (ref 3.5–5.2)
Sodium: 142 mmol/L (ref 134–144)
eGFR: 48 mL/min/{1.73_m2} — ABNORMAL LOW (ref >59)

## 2022-05-18 NOTE — Telephone Encounter (Signed)
New Message:     Patient's daughter is calling. She was able to come to [name] appointment today. She would like to know what was said and what was decided about the procedure.

## 2022-05-18 NOTE — Patient Instructions (Signed)
Medication Instructions:   Your physician recommends that you continue on your current medications as directed. Please refer to the Current Medication list given to you today.  *If you need a refill on your cardiac medications before your next appointment, please call your pharmacy*   Lab Work:  BMET TODAY    If you have labs (blood work) drawn today and your tests are completely normal, you will receive your results only by: [place] (if you have MyChart) OR A paper copy in the mail If you have any lab test that is abnormal or we need to change your treatment, we will call you to review the results.   Testing/Procedures: NONE ORDERED  TODAY     Follow-Up: At [facility], you and your health needs are our priority.  As part of our continuing mission to provide you with exceptional heart care, we have created designated Provider Care Teams.  These Care Teams include your primary Cardiologist (physician) and Advanced Practice Providers (APPs -  Physician Assistants and Nurse Practitioners) who all work together to provide you with the care you need, when you need it.  We recommend signing up for the patient portal called "MyChart".  Sign up information is provided on this After Visit Summary.  MyChart is used to connect with patients for Virtual Visits (Telemedicine).  Patients are able to view lab/test results, encounter notes, upcoming appointments, etc.  Non-urgent messages can be sent to your provider as well.   To learn more about what you can do with MyChart, go to [email or web address].    Your next appointment:  AS SCHEDULED   The format for your next appointment:   In Person  Provider:   [name], MD    Other Instructions   Important Information About Sugar

## 2022-06-06 ENCOUNTER — Emergency Department (HOSPITAL_COMMUNITY): Payer: HMO

## 2022-06-06 ENCOUNTER — Encounter (HOSPITAL_COMMUNITY): Payer: Self-pay | Admitting: *Deleted

## 2022-06-06 ENCOUNTER — Inpatient Hospital Stay (HOSPITAL_COMMUNITY)
Admission: EM | Admit: 2022-06-06 | Discharge: 2022-06-10 | DRG: 286 | Disposition: A | Discharge status: Home / Self Care | Payer: HMO | Attending: Internal Medicine | Admitting: Internal Medicine

## 2022-06-06 ENCOUNTER — Other Ambulatory Visit: Payer: Self-pay

## 2022-06-06 ENCOUNTER — Emergency Department: Payer: Self-pay

## 2022-06-06 DIAGNOSIS — I428 Other cardiomyopathies: Secondary | ICD-10-CM | POA: Diagnosis present

## 2022-06-06 DIAGNOSIS — R7989 Other specified abnormal findings of blood chemistry: Secondary | ICD-10-CM

## 2022-06-06 DIAGNOSIS — Z8249 Family history of ischemic heart disease and other diseases of the circulatory system: Secondary | ICD-10-CM

## 2022-06-06 DIAGNOSIS — I4821 Permanent atrial fibrillation: Secondary | ICD-10-CM | POA: Diagnosis present

## 2022-06-06 DIAGNOSIS — M545 Low back pain, unspecified: Secondary | ICD-10-CM | POA: Diagnosis present

## 2022-06-06 DIAGNOSIS — Z8042 Family history of malignant neoplasm of prostate: Secondary | ICD-10-CM

## 2022-06-06 DIAGNOSIS — Q240 Dextrocardia: Secondary | ICD-10-CM | POA: Diagnosis not present

## 2022-06-06 DIAGNOSIS — Z7901 Long term (current) use of anticoagulants: Secondary | ICD-10-CM

## 2022-06-06 DIAGNOSIS — I130 Hypertensive heart and chronic kidney disease with heart failure and stage 1 through stage 4 chronic kidney disease, or unspecified chronic kidney disease: Secondary | ICD-10-CM | POA: Diagnosis present

## 2022-06-06 DIAGNOSIS — E785 Hyperlipidemia, unspecified: Secondary | ICD-10-CM | POA: Diagnosis present

## 2022-06-06 DIAGNOSIS — I340 Nonrheumatic mitral (valve) insufficiency: Secondary | ICD-10-CM | POA: Diagnosis present

## 2022-06-06 DIAGNOSIS — Z950 Presence of cardiac pacemaker: Secondary | ICD-10-CM

## 2022-06-06 DIAGNOSIS — R0602 Shortness of breath: Secondary | ICD-10-CM | POA: Diagnosis present

## 2022-06-06 DIAGNOSIS — I255 Ischemic cardiomyopathy: Secondary | ICD-10-CM | POA: Diagnosis present

## 2022-06-06 DIAGNOSIS — E119 Type 2 diabetes mellitus without complications: Secondary | ICD-10-CM | POA: Diagnosis not present

## 2022-06-06 DIAGNOSIS — Z91199 Patient's noncompliance with other medical treatment and regimen due to unspecified reason: Secondary | ICD-10-CM

## 2022-06-06 DIAGNOSIS — I5042 Chronic combined systolic (congestive) and diastolic (congestive) heart failure: Secondary | ICD-10-CM

## 2022-06-06 DIAGNOSIS — I5043 Acute on chronic combined systolic (congestive) and diastolic (congestive) heart failure: Secondary | ICD-10-CM | POA: Diagnosis not present

## 2022-06-06 DIAGNOSIS — Z7951 Long term (current) use of inhaled steroids: Secondary | ICD-10-CM | POA: Diagnosis not present

## 2022-06-06 DIAGNOSIS — I2510 Atherosclerotic heart disease of native coronary artery without angina pectoris: Secondary | ICD-10-CM | POA: Diagnosis present

## 2022-06-06 DIAGNOSIS — E1122 Type 2 diabetes mellitus with diabetic chronic kidney disease: Secondary | ICD-10-CM | POA: Diagnosis present

## 2022-06-06 DIAGNOSIS — Z951 Presence of aortocoronary bypass graft: Secondary | ICD-10-CM | POA: Diagnosis not present

## 2022-06-06 DIAGNOSIS — D696 Thrombocytopenia, unspecified: Secondary | ICD-10-CM | POA: Diagnosis present

## 2022-06-06 DIAGNOSIS — Z8674 Personal history of sudden cardiac arrest: Secondary | ICD-10-CM | POA: Diagnosis not present

## 2022-06-06 DIAGNOSIS — I10 Essential (primary) hypertension: Secondary | ICD-10-CM | POA: Diagnosis not present

## 2022-06-06 DIAGNOSIS — I5023 Acute on chronic systolic (congestive) heart failure: Secondary | ICD-10-CM | POA: Diagnosis present

## 2022-06-06 DIAGNOSIS — M1990 Unspecified osteoarthritis, unspecified site: Secondary | ICD-10-CM | POA: Diagnosis present

## 2022-06-06 DIAGNOSIS — Z91148 Patient's other noncompliance with medication regimen for other reason: Secondary | ICD-10-CM

## 2022-06-06 DIAGNOSIS — Z91041 Radiographic dye allergy status: Secondary | ICD-10-CM

## 2022-06-06 DIAGNOSIS — J449 Chronic obstructive pulmonary disease, unspecified: Secondary | ICD-10-CM | POA: Diagnosis present

## 2022-06-06 DIAGNOSIS — Z833 Family history of diabetes mellitus: Secondary | ICD-10-CM

## 2022-06-06 DIAGNOSIS — Z7984 Long term (current) use of oral hypoglycemic drugs: Secondary | ICD-10-CM

## 2022-06-06 DIAGNOSIS — Z91013 Allergy to seafood: Secondary | ICD-10-CM

## 2022-06-06 DIAGNOSIS — I4819 Other persistent atrial fibrillation: Secondary | ICD-10-CM | POA: Diagnosis not present

## 2022-06-06 DIAGNOSIS — N189 Chronic kidney disease, unspecified: Secondary | ICD-10-CM | POA: Diagnosis not present

## 2022-06-06 DIAGNOSIS — Z87891 Personal history of nicotine dependence: Secondary | ICD-10-CM

## 2022-06-06 DIAGNOSIS — M109 Gout, unspecified: Secondary | ICD-10-CM | POA: Diagnosis present

## 2022-06-06 DIAGNOSIS — Z888 Allergy status to other drugs, medicaments and biological substances status: Secondary | ICD-10-CM | POA: Diagnosis not present

## 2022-06-06 DIAGNOSIS — K219 Gastro-esophageal reflux disease without esophagitis: Secondary | ICD-10-CM | POA: Diagnosis present

## 2022-06-06 DIAGNOSIS — N1832 Chronic kidney disease, stage 3b: Secondary | ICD-10-CM | POA: Diagnosis present

## 2022-06-06 DIAGNOSIS — Z880 Allergy status to penicillin: Secondary | ICD-10-CM

## 2022-06-06 DIAGNOSIS — I2583 Coronary atherosclerosis due to lipid rich plaque: Secondary | ICD-10-CM | POA: Diagnosis present

## 2022-06-06 DIAGNOSIS — Z823 Family history of stroke: Secondary | ICD-10-CM

## 2022-06-06 DIAGNOSIS — Z79899 Other long term (current) drug therapy: Secondary | ICD-10-CM

## 2022-06-06 DIAGNOSIS — N179 Acute kidney failure, unspecified: Secondary | ICD-10-CM | POA: Diagnosis present

## 2022-06-06 DIAGNOSIS — Z885 Allergy status to narcotic agent status: Secondary | ICD-10-CM

## 2022-06-06 DIAGNOSIS — I493 Ventricular premature depolarization: Secondary | ICD-10-CM | POA: Diagnosis not present

## 2022-06-06 DIAGNOSIS — Z91030 Bee allergy status: Secondary | ICD-10-CM

## 2022-06-06 DIAGNOSIS — I509 Heart failure, unspecified: Secondary | ICD-10-CM

## 2022-06-06 LAB — TROPONIN I (HIGH SENSITIVITY)
Troponin I (High Sensitivity): 40 ng/L — ABNORMAL HIGH (ref <18)
Troponin I (High Sensitivity): 28 ng/L — ABNORMAL HIGH (ref <18)

## 2022-06-06 LAB — CBC
HCT: 44.5 % (ref 36.0–46.0)
Hemoglobin: 14.2 g/dL (ref 12.0–15.0)
MCH: 29.1 pg (ref 26.0–34.0)
MCHC: 31.9 g/dL (ref 30.0–36.0)
MCV: 91.2 fL (ref 80.0–100.0)
Platelets: 108 10*3/uL — ABNORMAL LOW (ref 150–400)
RBC: 4.88 MIL/uL (ref 3.87–5.11)
RDW: 17.3 % — ABNORMAL HIGH (ref 11.5–15.5)
WBC: 6.5 10*3/uL (ref 4.0–10.5)
nRBC: 0 % (ref 0.0–0.2)

## 2022-06-06 LAB — BASIC METABOLIC PANEL
Anion gap: 9 (ref 5–15)
BUN: 28 mg/dL — ABNORMAL HIGH (ref 8–23)
CO2: 17 mmol/L — ABNORMAL LOW (ref 22–32)
Calcium: 9.1 mg/dL (ref 8.9–10.3)
Chloride: 115 mmol/L — ABNORMAL HIGH (ref 98–111)
Creatinine, Ser: 1.62 mg/dL — ABNORMAL HIGH (ref 0.44–1.00)
GFR, Estimated: 33 mL/min — ABNORMAL LOW (ref >60)
Glucose, Bld: 105 mg/dL — ABNORMAL HIGH (ref 70–99)
Potassium: 4.3 mmol/L (ref 3.5–5.1)
Sodium: 141 mmol/L (ref 135–145)

## 2022-06-06 LAB — URINALYSIS, ROUTINE W REFLEX MICROSCOPIC
Bilirubin Urine: NEGATIVE
Glucose, UA: NEGATIVE mg/dL
Ketones, ur: NEGATIVE mg/dL
Leukocytes,Ua: NEGATIVE
Nitrite: NEGATIVE
Protein, ur: 100 mg/dL — AB
Specific Gravity, Urine: >1.03 — ABNORMAL HIGH (ref 1.005–1.030)
pH: 5.5 (ref 5.0–8.0)

## 2022-06-06 LAB — URINALYSIS, MICROSCOPIC (REFLEX)

## 2022-06-06 LAB — ECHOCARDIOGRAM COMPLETE
Area-P 1/2: 4.12 cm2
Calc EF: 21.8 %
MV M vel: 5.27 m/s
MV Peak grad: 111.1 mmHg
S' Lateral: 4.6 cm
Single Plane A2C EF: 14.4 %
Single Plane A4C EF: 27.9 %

## 2022-06-06 LAB — BRAIN NATRIURETIC PEPTIDE: B Natriuretic Peptide: 1732.7 pg/mL — ABNORMAL HIGH (ref 0.0–100.0)

## 2022-06-06 LAB — LACTIC ACID, PLASMA
Lactic Acid, Venous: 2.1 mmol/L (ref 0.5–1.9)
Lactic Acid, Venous: 2.8 mmol/L (ref 0.5–1.9)

## 2022-06-06 MED ORDER — ACETAMINOPHEN 325 MG PO TABS
650.0000 mg | ORAL_TABLET | Freq: Four times a day (QID) | ORAL | Status: DC | PRN
  Administered 2022-06-06 – 2022-06-08 (×4): 650 mg via ORAL
  Filled 2022-06-06 (×4): qty 2

## 2022-06-06 MED ORDER — FLUTICASONE FUROATE-VILANTEROL 100-25 MCG/ACT IN AEPB
1.0000 | INHALATION_SPRAY | Freq: Every day | RESPIRATORY_TRACT | Status: DC
  Administered 2022-06-07 – 2022-06-10 (×4): 1 via RESPIRATORY_TRACT
  Filled 2022-06-06: qty 28

## 2022-06-06 MED ORDER — ENOXAPARIN SODIUM 40 MG/0.4ML IJ SOSY: 40.0000 mg | PREFILLED_SYRINGE | INTRAMUSCULAR | Status: DC

## 2022-06-06 MED ORDER — FUROSEMIDE 10 MG/ML IJ SOLN
60.0000 mg | Freq: Once | INTRAMUSCULAR | Status: AC
  Administered 2022-06-06: 60 mg via INTRAVENOUS
  Filled 2022-06-06: qty 6

## 2022-06-06 MED ORDER — ONDANSETRON HCL 4 MG/2ML IJ SOLN: 4.0000 mg | Freq: Four times a day (QID) | INTRAMUSCULAR | Status: DC | PRN

## 2022-06-06 MED ORDER — SODIUM CHLORIDE 0.9% FLUSH
3.0000 mL | Freq: Two times a day (BID) | INTRAVENOUS | Status: DC
  Administered 2022-06-06 – 2022-06-07 (×4): 3 mL via INTRAVENOUS

## 2022-06-06 MED ORDER — RIVAROXABAN 20 MG PO TABS
20.0000 mg | ORAL_TABLET | Freq: Every day | ORAL | Status: DC
  Administered 2022-06-06 – 2022-06-09 (×4): 20 mg via ORAL
  Filled 2022-06-06: qty 1
  Filled 2022-06-06: qty 2
  Filled 2022-06-06 (×2): qty 1

## 2022-06-06 MED ORDER — ALBUTEROL SULFATE (2.5 MG/3ML) 0.083% IN NEBU
2.5000 mg | INHALATION_SOLUTION | Freq: Four times a day (QID) | RESPIRATORY_TRACT | Status: DC | PRN
  Administered 2022-06-09: 2.5 mg via RESPIRATORY_TRACT
  Filled 2022-06-06: qty 3

## 2022-06-06 MED ORDER — ACETAMINOPHEN 650 MG RE SUPP: 650.0000 mg | Freq: Four times a day (QID) | RECTAL | Status: DC | PRN

## 2022-06-06 MED ORDER — FUROSEMIDE 10 MG/ML IJ SOLN
80.0000 mg | Freq: Two times a day (BID) | INTRAMUSCULAR | Status: AC
  Administered 2022-06-07 (×2): 80 mg via INTRAVENOUS
  Filled 2022-06-06 (×2): qty 8

## 2022-06-06 MED ORDER — ISOSORB DINITRATE-HYDRALAZINE 20-37.5 MG PO TABS
1.0000 | ORAL_TABLET | Freq: Three times a day (TID) | ORAL | Status: DC
  Administered 2022-06-06 – 2022-06-09 (×9): 1 via ORAL
  Filled 2022-06-06 (×12): qty 1

## 2022-06-06 MED ORDER — FUROSEMIDE 10 MG/ML IJ SOLN: 80.0000 mg | Freq: Two times a day (BID) | INTRAMUSCULAR | Status: DC

## 2022-06-06 MED ORDER — ONDANSETRON HCL 4 MG PO TABS: 4.0000 mg | ORAL_TABLET | Freq: Four times a day (QID) | ORAL | Status: DC | PRN

## 2022-06-06 MED ORDER — NAPHAZOLINE-GLYCERIN 0.012-0.25 % OP SOLN: 1.0000 [drp] | Freq: Every day | OPHTHALMIC | Status: DC | PRN

## 2022-06-06 MED ORDER — FUROSEMIDE 10 MG/ML IJ SOLN
40.0000 mg | Freq: Once | INTRAMUSCULAR | Status: AC
  Administered 2022-06-06: 40 mg via INTRAVENOUS
  Filled 2022-06-06: qty 4

## 2022-06-06 MED ORDER — UMECLIDINIUM BROMIDE 62.5 MCG/ACT IN AEPB
1.0000 | INHALATION_SPRAY | Freq: Every day | RESPIRATORY_TRACT | Status: DC
  Administered 2022-06-07 – 2022-06-10 (×4): 1 via RESPIRATORY_TRACT
  Filled 2022-06-06 (×2): qty 7
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
## 2022-06-06 NOTE — ED Notes (Signed)
Received verbal report from Nadine W RN at this time 

## 2022-06-06 NOTE — ED Notes (Signed)
Teletracking placed for this pt at this time

## 2022-06-06 NOTE — ED Notes (Signed)
Pt refused purewick. Pt uncooperative and impulsive trying to get out of bed.

## 2022-06-06 NOTE — H&P (Signed)
History and Physical    Patient: [name] [name]:[id number] DOB: [birth year] DOA: 06/06/2022 DOS: the patient was seen and examined on 06/06/2022 PCP: [name] Lima, MD  Patient coming from: home via EMS  Chief Complaint:  Chief Complaint  Patient presents with   Shortness of Breath   HPI: [name] [name] is a 76 y.o. female with medical history significant of hypertension, hyperlipidemia, combined systolic and diastolic CHF with EF 81-19%, permanent atrial fibrillation, CAD s/p CABG in 2016, cardiac arrest, COPD, DM type II, CKD stage III, iron deficiency anemia, gout, and GERD symptoms with complaints of shortness of breath.  She reports having misplaced her torsemide pills 2 days ago.  Reports having shortness of breath whenever sitting up which causes her to cough.  Denies having any significant fever, chest pain, nausea, vomiting, abdominal pain, or diarrhea.Marland Kitchen  Upon admission into the emergency department patient was noted to be afebrile with tachypnea and O2 saturation currently maintained on room air.  Labs significant for platelets 108, CO2 17, BUN 28, creatinine 1.62, glucose 105, anion gap 9, BNP 1732.7, and high-sensitivity troponin 40->28.  Chest x-ray noted cardiomegaly with borderline pulmonary vascular congestion.  Urinalysis was noted to be concentrated without clear signs of infection.  Had been given Lasix 60 mg IV.  Heart failure team has been consulted.   Review of Systems: As mentioned in the history of present illness. All other systems reviewed and are negative. Past Medical History:  Diagnosis Date   ([place]) heart failure with improved ejection fraction ([place])    a. 07/2018 Echo: EF 30-35%; b. 11/2019 Echo: EF 55-60%, no rwma, Gr2 DD, Nl RV size/fxn. Mild BAE. Mild MR/AI.   Arthritis    Asthma    Atopic dermatitis    CAD (coronary artery disease)    a. 2016 s/p CABG x 2 ([name]->LAD, VG->OM); b. 08/2018 MV: EF 44%, no ischemia/infact.   Cardiac arrest ([place]) 10/2014    Cardiomyopathy, ischemic    a. 07/2018 Echo: EF 30-35%; 11/2019 Echo: EF 55-60%.   Carotid arterial disease ([place])    a. 11/2019 Carotid U/S   CHF (congestive heart failure) (HCC)    CKD (chronic kidney disease), stage III (HCC)    COPD (chronic obstructive pulmonary disease) (HCC)    Diabetes mellitus without complication ([place])    Dysrhythmia    Esophageal dilatation 2013   GERD (gastroesophageal reflux disease)    Gout    Headache    Hypertension    Hypokalemia    Idiopathic angioedema    LGI bleed 08/06/2017   a. felt to be hemorrhoidal during that admission (no drop in Hgb).   Lower back pain    Paroxysmal atrial fibrillation (HCC)    a. Dx 2016-->h/o difficult to control rates (complicated by noncompliance), not felt to be a candidate for ablation or antiarrhythmic due to noncompliance; b. Recurrent AF 2021 - converted w/ IV dilt; c. CHA2DS2VASc = 7-->Xarelto.   Personal history of noncompliance with medical treatment, presenting hazards to health    Prediabetes    Presence of permanent cardiac pacemaker    S/P CABG x 2 with clipping of LA appendage 11/14/2014   [name] to LAD, SVG to OM, EVH via right thigh   Urine incontinence    Uterine fibroid    Past Surgical History:  Procedure Laterality Date   AV NODE ABLATION N/A 01/18/2021   Procedure: AV NODE ABLATION;  Surgeon: Evans Lance, MD;  Location: [place] [facility];  Service:  Cardiovascular;  Laterality: N/A;   BALLOON DILATION N/A 10/10/2019   Procedure: BALLOON DILATION;  Surgeon: Otis Brace, MD;  Location: [place];  Service: Gastroenterology;  Laterality: N/A;   BIOPSY  10/10/2019   Procedure: BIOPSY;  Surgeon: Otis Brace, MD;  Location: [place];  Service: Gastroenterology;;   BIOPSY  10/11/2019   Procedure: BIOPSY;  Surgeon: Otis Brace, MD;  Location: [place];  Service: Gastroenterology;;   CARDIOVERSION N/A 11/18/2014   Procedure: CARDIOVERSION;  Surgeon: [name], MD;   Location: [place];  Service: Cardiovascular;  Laterality: N/A;   CARDIOVERSION N/A 12/25/2017   Procedure: CARDIOVERSION;  Surgeon: [name], MD;  Location: [place];  Service: Cardiovascular;  Laterality: N/A;   CLIPPING OF ATRIAL APPENDAGE N/A 11/14/2014   Procedure: CLIPPING OF ATRIAL APPENDAGE;  Surgeon: [name], MD;  Location: [place];  Service: Open Heart Surgery;  Laterality: N/A;   COLONOSCOPY  2013   COLONOSCOPY WITH PROPOFOL N/A 10/11/2019   Procedure: COLONOSCOPY WITH PROPOFOL;  Surgeon: Otis Brace, MD;  Location: [place];  Service: Gastroenterology;  Laterality: N/A;   CORONARY ARTERY BYPASS GRAFT N/A 11/14/2014   Procedure: CORONARY ARTERY BYPASS GRAFTING (CABG)TIMES 2 USING LEFT INTERNAL MAMMARY ARTERY AND RIGHT SAPHENOUS VEIN HARVESTED ENDOSCOPICALLY;  Surgeon: [name], MD;  Location: [place];  Service: Open Heart Surgery;  Laterality: N/A;   ESOPHAGOGASTRODUODENOSCOPY (EGD) WITH PROPOFOL N/A 10/10/2019   Procedure: ESOPHAGOGASTRODUODENOSCOPY (EGD) WITH PROPOFOL;  Surgeon: Otis Brace, MD;  Location: [place];  Service: Gastroenterology;  Laterality: N/A;   ESOPHAGOGASTRODUODENOSCOPY (EGD) WITH PROPOFOL N/A 10/27/2019   Procedure: ESOPHAGOGASTRODUODENOSCOPY (EGD) WITH PROPOFOL;  Surgeon: Ronnette Juniper, MD;  Location: [place];  Service: Gastroenterology;  Laterality: N/A;   GIVENS CAPSULE STUDY N/A 10/27/2019   Procedure: GIVENS CAPSULE STUDY;  Surgeon: Ronnette Juniper, MD;  Location: [place];  Service: Gastroenterology;  Laterality: N/A;   INSERT / REPLACE / REMOVE PACEMAKER     LEFT HEART CATHETERIZATION WITH CORONARY ANGIOGRAM N/A 11/04/2014   Procedure: LEFT HEART CATHETERIZATION WITH CORONARY ANGIOGRAM;  Surgeon: Troy Sine, MD;  Location[facility] C[facility];  Service: Cardiovascular;  Laterality: N/A;   PACEMAKER IMPLANT N/A 01/18/2021   Procedure: PACEMAKER IMPLANT;  Surgeon: Evans Lance, MD;  Location: [place] [facility];   Service: Cardiovascular;  Laterality: N/A;   PACEMAKER IMPLANT Left    POLYPECTOMY  10/11/2019   Procedure: POLYPECTOMY;  Surgeon: Otis Brace, MD;  Location: [place] [facility];  Service: Gastroenterology;;   TEE WITHOUT CARDIOVERSION N/A 11/14/2014   Procedure: TRANSESOPHAGEAL ECHOCARDIOGRAM (TEE);  Surgeon: [name], MD;  Location: [place];  Service: Open Heart Surgery;  Laterality: N/A;   TEE WITHOUT CARDIOVERSION N/A 12/25/2017   Procedure: TRANSESOPHAGEAL ECHOCARDIOGRAM (TEE);  Surgeon: [name], MD;  Location: [facility]OSCOPY;  Service: Cardiovascular;  Laterality: N/A;   TEMPORARY PACEMAKER INSERTION  11/04/2014   Procedure: TEMPORARY PACEMAKER INSERTION;  Surgeon: Troy Sine, MD;  Location: [facility] CATH LAB;  Service: Cardiovascular;;   TOOTH EXTRACTION     Social History:  reports that she quit smoking about 6 months ago. Her smoking use included cigarettes. She has a 28.00 pack-year smoking history. She has never used smokeless tobacco. She reports that she does not drink alcohol and does not use drugs.  Allergies  Allergen Reactions   Bee Venom Anaphylaxis   Ivp Dye [Iodinated Contrast Media] Anaphylaxis   Ace Inhibitors     Other reaction(s): angioedema   Atenolol  Other reaction(s): severe headaches   Codeine Nausea And Vomiting   Entresto [Sacubitril-Valsartan] Other (See Comments)    Chest pain    Penicillin G     Other reaction(s): rash   Shrimp [Shellfish Allergy] Swelling   Simvastatin     Other reaction(s): not sure   Jardiance [Empagliflozin] Other (See Comments)    Caused Boils     Family History  Problem Relation Age of Onset   Cancer Mother        LYMPHOMA   Heart disease Father    Other Father        TB   CVA Sister    Prostate cancer Brother 36   Diabetes Brother     Prior to Admission medications   Medication Sig Start Date End Date Taking? Authorizing Provider  acetaminophen (TYLENOL) 325 MG tablet Take 2 tablets (650 mg  total) by mouth every 4 (four) hours as needed for headache or mild pain. 12/09/20   Mikhail, Velta Addison, DO  albuterol (VENTOLIN HFA) 108 (90 Base) MCG/ACT inhaler Inhale 1-2 puffs into the lungs every 6 (six) hours as needed for wheezing or shortness of breath. 03/20/22   Rising, Wells Guiles, PA-C  allopurinol (ZYLOPRIM) 100 MG tablet Take 100 mg by mouth daily. 07/19/21   [provider]  atorvastatin (LIPITOR) 10 MG tablet Take 1 tablet (10 mg total) by mouth at bedtime. 03/17/22   [name] Lima, MD  azithromycin (ZITHROMAX) 250 MG tablet Take 1 tablet (250 mg total) by mouth daily. Take first 2 tablets together, then 1 every day until finished. 03/14/22   White, Leitha Schuller, NP  dextromethorphan-guaiFENesin (MUCINEX DM) 30-600 MG 12hr tablet Take 1 tablet by mouth 2 (two) times daily. Take 1/2 tablet every 12 hours for cough. 02/05/22   Nyoka Lint, PA-C  Fluticasone-Umeclidin-Vilant (TRELEGY ELLIPTA) 100-62.5-25 MCG/ACT AEPB Inhale 1 puff into the lungs daily. 03/16/22   [name] Lima, MD  ipratropium-albuterol (DUONEB) 0.5-2.5 (3) MG/3ML SOLN Take 3 mLs by nebulization every 6 (six) hours as needed. 03/20/22   Rising, Wells Guiles, PA-C  losartan (COZAAR) 50 MG tablet Take 1 tablet (50 mg total) by mouth daily. 12/22/21   Bensimhon, Shaune Pascal, MD  metFORMIN (GLUCOPHAGE) 500 MG tablet Take 500 mg by mouth daily with breakfast. Daily with a meal    [provider]  metoprolol succinate (TOPROL-XL) 100 MG 24 hr tablet TAKE ONE TABLET BY MOUTH EVERY MORNING with OR immediately following A meal 05/09/22   Evans Lance, MD  pantoprazole (PROTONIX) 40 MG tablet Take 1 tablet (40 mg total) by mouth daily. 04/14/22   Vanessa Kick, MD  Prednisol Ace-Moxiflox-Bromfen 1-0.5-0.075 % SUSP Place 1 drop into the right eye 4 (four) times daily. 05/04/21   [provider]  rivaroxaban (XARELTO) 20 MG TABS tablet Take 1 tablet (20 mg total) by mouth daily with supper. Resume from 11/01/2019 11/01/19   Aline August, MD  spironolactone (ALDACTONE) 25 MG tablet Take 1 tablet (25 mg total) by mouth daily. 06/21/21   Rafael Bihari, FNP  torsemide (DEMADEX) 20 MG tablet Take 20 mg by mouth daily.    [provider]    Physical Exam: Vitals:   06/06/22 0900 06/06/22 1030 06/06/22 1040 06/06/22 1145  BP: (!) 159/106 (!) 152/84  116/86  Pulse: 69 73  67  Resp: 19 (!) 25  (!) 29  Temp:   97.6 F (36.4 C)   TempSrc:   Oral   SpO2: 92% 94%  97%  Constitutional: Elderly female who appears to be short of breath Eyes: PERRL, lids and conjunctivae normal ENMT: Mucous membranes are moist.   Neck: normal, supple, and JVD present Respiratory: Overall aeration without significant wheezes or rhonchi appreciated.  Patient on 2 L of nasal cannula oxygen with O2 saturation currently maintained.  Talking in shortened sentences Cardiovascular: Paced rhythm with murmur present.  1+ pitting edema noted mostly on the right lower extremity.  Abdomen: no tenderness, no masses palpated. . Bowel sounds positive.  Musculoskeletal: no clubbing / cyanosis. No joint deformity upper and lower extremities. Good ROM, no contractures. Normal muscle tone.  Skin: no rashes, lesions, ulcers. No induration Neurologic: CN 2-12 grossly intact. Sensation intact, DTR normal. Strength 5/5 in all 4.  Psychiatric: Normal judgment and insight. Alert and oriented x 3. Normal mood.   Data Reviewed:  EKG reveals an irregular ventricularly paced rhythm at 78 bpm. Assessment and Plan:  Systolic and diastolic congestive heart failure exacerbation Acute on chronic.  Patient reports recently misplacing torsemide 2 days ago.  BNP elevated at 1732.7.  Patient has been given Lasix 60 mg IV x1 dose.  Last echocardiogram noted EF to be 25% with severe mitral regurg/tricuspid regurg.  Patient has been given Lasix 60 mg IV. -Admit to a cardiac telemetry bed -Heart failure order set utilized -Strict I&O's and daily weights -Check  echocardiogram -Lasix 80 mg IV twice daily per cardiology -Appreciate cardiology consultative services, we will follow-up for any further recommendations  Acute kidney injury superimposed on chronic kidney disease stage IIIb On admission creatinine elevated at 1.62 with BUN and 28.  Baseline creatinine previously had been 1.17 on 10//23.  This is greater than a 0.3 increased from baseline.  Suspect hypoperfusion in the setting of heart failure exacerbation as likely cause versus a cardiorenal syndrome. -Monitor kidney function with diuresis  CAD elevated troponin Patient with prior CABG back in 2016.  High-sensitivity troponins 40->28.  Thought secondary to demand in setting of heart failure exacerbation -Continue statin  Permanent atrial fibrillation on chronic anticoagulation s/p permanent pacemaker Patient appears to be currently rate controlled. -Continue Xarelto  Severe mitral regurgitation As noted on last echocardiogram from 2022. -Follow-up repeat echo  -Per cardiology  Essential hypertension Home blood pressure regimen include losartan 50 mg daily, metoprolol succinate 100 mg daily, spironolactone 25 mg daily, and torsemide 20 mg daily.  In the ED cardiology had started the patient on BiDil. -Continue BiDil per cardiology recommendations as tolerated  COPD, without acute exacerbation -Continue pharmacy substitution for trelogy -Albuterol Nebs as needed for shortness of breath  Controlled diabetes mellitus type 2, without long-term use of insulin Last hemoglobin A1c was 6.2 on 03/16/2022.  Home medication regimen includes metformin 500 mg daily with breakfast. -Hold metformin -Heart healthy carb modified diet  Thrombocytopenia Chronic.  Platelet count 108. -Continue to monitor  DVT prophylaxis: Xarelto Advance Care Planning:   Code Status: Full Code   Consults: Cardiology  Family Communication: None requested  Severity of Illness: The appropriate patient status  for this patient is INPATIENT. Inpatient status is judged to be reasonable and necessary in order to provide the required intensity of service to ensure the patient's safety. [name] presenting symptoms, physical exam findings, and initial radiographic and laboratory data in the context of their chronic comorbidities is felt to place them at high risk for further clinical deterioration. Furthermore, it is not anticipated that the patient will be medically stable for discharge from the hospital within 2 midnights of  admission.   * I certify that at the point of admission it is my clinical judgment that the patient will require inpatient hospital care spanning beyond 2 midnights from the point of admission due to high intensity of service, high risk for further deterioration and high frequency of surveillance required.*  Author: Norval Morton, MD 06/06/2022 12:51 PM  For on call review [email or web address].

## 2022-06-06 NOTE — Progress Notes (Signed)
Heart Failure Navigator Progress Note  Assessed for Heart & Vascular TOC clinic readiness.  Patient does not meet criteria due to Advanced Heart Failure patient of Dr. Mahalia Longest.     Earnestine Leys, BSN, Clinical cytogeneticist Only

## 2022-06-06 NOTE — Progress Notes (Signed)
Spoke with patient's daughter regarding the PICC insertion, she is agreeable and states that she will speak with patient about having it inserted.  Went into patient room to attempt to have her call her daughter on her cell phone.  Patient states "I don't even want to talk to her, I know what she is going to say and I don't want it.".  Whitney, E. RN at bedside and attempted to assist patient with reason for having the line placed.  Patient continues to refuse and does not wish to speak with her daughter at this time.  Olin Hauser (daughter) called again by me and explained what was going on.  She states that she would not force her to do it and maybe we can try again tomorrow.  IV team will follow up in am.  Will notify provider on call of the above.

## 2022-06-06 NOTE — ED Notes (Signed)
Pt found to be standing in her doorway and had crawled out the end of her bed. Educated pt on need to stay in her bed and use her call light. Pt verbalized understanding.

## 2022-06-06 NOTE — ED Triage Notes (Signed)
Pt from home for sob and weakness. Denis any pain. Reports misplacing her torsemide x 2 days. EMS placed pt on oxygen although room air sats in the low 90s

## 2022-06-06 NOTE — Plan of Care (Signed)
  Problem: Education: Goal: Ability to demonstrate management of disease process will improve Outcome: Progressing Goal: Ability to verbalize understanding of medication therapies will improve Outcome: Progressing   

## 2022-06-06 NOTE — ED Provider Notes (Cosign Needed Addendum)
[place] EMERGENCY DEPARTMENT Provider Note   CSN: [id number] Arrival date & time: 06/06/22  0543     History Chief Complaint  Patient presents with   Shortness of Breath    [name] [name] is a 76 y.o. female with history of COPD, still smoking tobacco, CHF, hypertension, CAD, CKD, A-fib, medical noncompliance, diabetes presents the emergency room and for evaluation of weakness and shortness of breath for the past 2 days.  Patient reports that she misplaced her "kidney pills" for the past 2 days and has not taken them.  She assures me though that she has taken her Xarelto every day.  She denies any chest pain, fevers, cough, abdominal pain, nausea, vomiting, dark stools, dysuria, hematuria.  She denies any leg swelling.  She reports that she feels fine whenever she is at rest however her shortness of breath worsens upon exertion.  She denies any illicit drug use or alcohol use.     Shortness of Breath Associated symptoms: no abdominal pain, no chest pain, no cough, no fever and no vomiting        Home Medications Prior to Admission medications   Medication Sig Start Date End Date Taking? Authorizing Provider  acetaminophen (TYLENOL) 325 MG tablet Take 2 tablets (650 mg total) by mouth every 4 (four) hours as needed for headache or mild pain. 12/09/20   Mikhail, Velta Addison, DO  albuterol (VENTOLIN HFA) 108 (90 Base) MCG/ACT inhaler Inhale 1-2 puffs into the lungs every 6 (six) hours as needed for wheezing or shortness of breath. 03/20/22   Rising, Wells Guiles, PA-C  allopurinol (ZYLOPRIM) 100 MG tablet Take 100 mg by mouth daily. 07/19/21   [provider]  atorvastatin (LIPITOR) 10 MG tablet Take 1 tablet (10 mg total) by mouth at bedtime. 03/17/22   Janith Lima, MD  azithromycin (ZITHROMAX) 250 MG tablet Take 1 tablet (250 mg total) by mouth daily. Take first 2 tablets together, then 1 every day until finished. 03/14/22   White, Leitha Schuller, NP   dextromethorphan-guaiFENesin (MUCINEX DM) 30-600 MG 12hr tablet Take 1 tablet by mouth 2 (two) times daily. Take 1/2 tablet every 12 hours for cough. 02/05/22   Nyoka Lint, PA-C  Fluticasone-Umeclidin-Vilant (TRELEGY ELLIPTA) 100-62.5-25 MCG/ACT AEPB Inhale 1 puff into the lungs daily. 03/16/22   Janith Lima, MD  ipratropium-albuterol (DUONEB) 0.5-2.5 (3) MG/3ML SOLN Take 3 mLs by nebulization every 6 (six) hours as needed. 03/20/22   Rising, Wells Guiles, PA-C  losartan (COZAAR) 50 MG tablet Take 1 tablet (50 mg total) by mouth daily. 12/22/21   Bensimhon, Shaune Pascal, MD  metFORMIN (GLUCOPHAGE) 500 MG tablet Take 500 mg by mouth daily with breakfast. Daily with a meal    [provider]  metoprolol succinate (TOPROL-XL) 100 MG 24 hr tablet TAKE ONE TABLET BY MOUTH EVERY MORNING with OR immediately following A meal 05/09/22   Evans Lance, MD  pantoprazole (PROTONIX) 40 MG tablet Take 1 tablet (40 mg total) by mouth daily. 04/14/22   Vanessa Kick, MD  Prednisol Ace-Moxiflox-Bromfen 1-0.5-0.075 % SUSP Place 1 drop into the right eye 4 (four) times daily. 05/04/21   [provider]  rivaroxaban (XARELTO) 20 MG TABS tablet Take 1 tablet (20 mg total) by mouth daily with supper. Resume from 11/01/2019 11/01/19   Aline August, MD  spironolactone (ALDACTONE) 25 MG tablet Take 1 tablet (25 mg total) by mouth daily. 06/21/21   Rafael Bihari, FNP  torsemide (DEMADEX) 20 MG tablet Take 20  mg by mouth daily.    [provider]      Allergies    Bee venom, Ivp dye [iodinated contrast media], Ace inhibitors, Atenolol, Codeine, Entresto [sacubitril-valsartan], Penicillin g, Shrimp [shellfish allergy], Simvastatin, and Jardiance [empagliflozin]    Review of Systems   Review of Systems  Constitutional:  Positive for fatigue. Negative for chills and fever.  Respiratory:  Positive for shortness of breath. Negative for cough.   Cardiovascular:  Negative for chest pain, palpitations and  leg swelling.  Gastrointestinal:  Negative for abdominal pain, nausea and vomiting.  Genitourinary:  Negative for dysuria and hematuria.  Neurological:  Positive for weakness.    Physical Exam Updated Vital Signs BP (!) 151/82   Pulse 75   Temp (!) 97.5 F (36.4 C) (Oral)   Resp (!) 21   SpO2 96%  Physical Exam Vitals and nursing note reviewed.  Constitutional:      General: She is not in acute distress.    Appearance: Normal appearance. She is not ill-appearing or toxic-appearing.  HENT:     Head: Normocephalic and atraumatic.  Eyes:     General: No scleral icterus. Cardiovascular:     Rate and Rhythm: Normal rate and regular rhythm.  Pulmonary:     Effort: Pulmonary effort is normal. No respiratory distress.     Breath sounds: Decreased breath sounds present.  Abdominal:     General: Abdomen is flat. Bowel sounds are normal.     Palpations: Abdomen is soft.     Tenderness: There is no abdominal tenderness. There is no guarding or rebound.  Musculoskeletal:        General: No deformity.     Cervical back: Normal range of motion.     Comments: Trace edema to bilateral lower legs  Skin:    General: Skin is warm and dry.  Neurological:     General: No focal deficit present.     Mental Status: She is alert. Mental status is at baseline.     ED Results / Procedures / Treatments   Labs (all labs ordered are listed, but only abnormal results are displayed) Labs Reviewed  CBC - Abnormal; Notable for the following components:      Result Value   RDW 17.3 (*)    Platelets 108 (*)    All other components within normal limits  BASIC METABOLIC PANEL - Abnormal; Notable for the following components:   Chloride 115 (*)    CO2 17 (*)    Glucose, Bld 105 (*)    BUN 28 (*)    Creatinine, Ser 1.62 (*)    GFR, Estimated 33 (*)    All other components within normal limits  BRAIN NATRIURETIC PEPTIDE - Abnormal; Notable for the following components:   B Natriuretic Peptide  1,732.7 (*)    All other components within normal limits  URINALYSIS, ROUTINE W REFLEX MICROSCOPIC  TROPONIN I (HIGH SENSITIVITY)    EKG EKG Interpretation  Date/Time:  Monday June 06 2022 06:18:34 EDT Ventricular Rate:  70 PR Interval:    QRS Duration: 154 QT Interval:  459 QTC Calculation: 496 R Axis:   243 Text Interpretation: Irregular VENTRICULAR PACED RHYTHM Nonspecific IVCD with LAD Abnormal T, consider ischemia, lateral leads Confirmed by Ripley Fraise [id number]) on 06/06/2022 6:29:40 AM  Radiology DG Chest Port 1 View  Result Date: 06/06/2022 CLINICAL DATA:  Shortness of breath EXAM: PORTABLE CHEST 1 VIEW COMPARISON:  05/03/2022 FINDINGS: Cardiomegaly. Upper mediastinal widening towards the right is from  ectatic vessels by prior CT. Left atrial clipping. Pacer lead into the right ventricle. Mild interstitial coarsening without Kerley lines or effusion. No air bronchogram. IMPRESSION: Cardiomegaly and borderline vascular congestion. Electronically Signed   By: Jorje Guild M.D.   On: 06/06/2022 06:25    Procedures Procedures   Medications Ordered in ED Medications - No data to display  ED Course/ Medical Decision Making/ A&P                           Medical Decision Making Amount and/or Complexity of Data Reviewed Labs: ordered.  Risk Prescription drug management. Decision regarding hospitalization.   76 y/o F presents the emergency apartment for evaluation of shortness of breath for the past few days.  Differential diagnosis includes resolved to COPD exacerbation, acute on chronic CHF, medical noncompliance, ACS, arrhythmia, PE, pneumonia, pneumothorax, aneurysm, dissection, viral illness.  Vital signs show blood pressure 151/82, slight decrease to 97.5, pulse rate 75, respiratory rate of 21 and satting 96% on room air.  Physical exam as noted above.  Labs ordered in triage.  I did add on a troponin.  I independently reviewed interpret the patient's labs.   BMP shows elevated chloride at 115.  Decreased bicarb at 17.  Patient's BUN at 28 with a creatinine of 1.62 which is higher than her baseline around 1.17.  GFR 33.  CBC shows no leukocytosis or anemia.  She has chronic thrombocytopenia with platelets of 108 which appears around her baseline.  BNP significantly elevated at 1732.7.  Troponin at 40 with repeat at 28.  Urinalysis shows amber urine that is concentrated.  Trace amount of hemoglobin and 100 protein.  Toxicity with any UTI.  Chest x-ray shows cardiomegaly with borderline vascular congestion.  EKG shows a regular ventricular paced rhythm with a nonspecific IVCD and LAD abnormal T, consider ischemia in the lateral leads.  On prior chart evaluation, patient is candidate for biventricular ICD.  Her ejection fracture is 25 to 30% she has severe biatrial enlargement.  She has documented medical noncompliance.  Given her significantly elevated BNP, and her missed doses of her torsemide, order the patient's Lasix 40 IV.  Dr. Clayborne Dana NP Amy Ninfa Meeker, placed orders as well given that she is a HF patient. She would like medical admission.   Per staff, patient is dropping into the 80s on exertion, will place on 2L [place].  Patient shortness of breath likely leading to her noncompliance to her diuretic.  Her BNP is significantly elevated.  This is likely acute on chronic CHF.  Patient reports that she is compliant with her blood thinners and has not missed a dose. She does have a h/o medical non compliance, however given her elevated BNP, this is more likely a CHF exacerbation. Troponins chronically elevated, but flat- less likely ACS. Story not consistent with any aneurysm or dissection.   Patient is amenable to admission for acute on chronic CHF exacerbation, AKI, new oxygen requirement.  Admit Dr. Tamala Julian.  I discussed this case with my attending physician who cosigned this note including patient's presenting symptoms, physical exam, and planned  diagnostics and interventions. Attending physician stated agreement with plan or made changes to plan which were implemented.   Final Clinical Impression(s) / ED Diagnoses Final diagnoses:  Acute on chronic congestive heart failure, unspecified heart failure type (HCC)  Thrombocytopenia (HCC)  AKI (acute kidney injury) ([place])  Elevated troponin    Rx / DC Orders ED Discharge Orders  None         Sherrell Puller, PA-C 06/06/22 1317    Godfrey Pick, MD 06/07/22 1854    Sherrell Puller, PA-C 07/02/22 0800    Godfrey Pick, MD 07/05/22 [phone]

## 2022-06-06 NOTE — Progress Notes (Signed)
  Echocardiogram 2D Echocardiogram has been performed.  [name] [name] 06/06/2022, 3:42 PM

## 2022-06-06 NOTE — ED Notes (Signed)
Nurse here to place PICC line. Patient refused, Cecilie Kicks will call legal guardian and attempt again.

## 2022-06-06 NOTE — Progress Notes (Signed)
   Lactic acid 2.1>2.8  Refusing PICC. Will need to start empiric milrinone 0.25 mcg. Continue to diurese with IV lasix.   Check lactic acid in am.    Lakshmi Sundeen NP-C  5:39 PM

## 2022-06-07 ENCOUNTER — Inpatient Hospital Stay: Payer: Self-pay

## 2022-06-07 ENCOUNTER — Inpatient Hospital Stay (HOSPITAL_COMMUNITY): Payer: HMO

## 2022-06-07 DIAGNOSIS — I5042 Chronic combined systolic (congestive) and diastolic (congestive) heart failure: Secondary | ICD-10-CM | POA: Diagnosis not present

## 2022-06-07 DIAGNOSIS — J449 Chronic obstructive pulmonary disease, unspecified: Secondary | ICD-10-CM | POA: Diagnosis not present

## 2022-06-07 DIAGNOSIS — I5023 Acute on chronic systolic (congestive) heart failure: Secondary | ICD-10-CM

## 2022-06-07 DIAGNOSIS — N179 Acute kidney failure, unspecified: Secondary | ICD-10-CM | POA: Diagnosis not present

## 2022-06-07 DIAGNOSIS — I4819 Other persistent atrial fibrillation: Secondary | ICD-10-CM | POA: Diagnosis not present

## 2022-06-07 LAB — BLOOD GAS, ARTERIAL
Acid-Base Excess: 0.9 mmol/L (ref 0.0–2.0)
Bicarbonate: 24 mmol/L (ref 20.0–28.0)
Drawn by: 336832
O2 Saturation: 96.4 %
Patient temperature: 37
pCO2 arterial: 33 mmHg (ref 32–48)
pH, Arterial: 7.47 — ABNORMAL HIGH (ref 7.35–7.45)
pO2, Arterial: 79 mmHg — ABNORMAL LOW (ref 83–108)

## 2022-06-07 LAB — LACTIC ACID, PLASMA
Lactic Acid, Venous: 2.2 mmol/L (ref 0.5–1.9)
Lactic Acid, Venous: 1.6 mmol/L (ref 0.5–1.9)
Lactic Acid, Venous: 1 mmol/L (ref 0.5–1.9)

## 2022-06-07 LAB — COOXEMETRY PANEL
Carboxyhemoglobin: 1.8 % — ABNORMAL HIGH (ref 0.5–1.5)
Methemoglobin: <0.7 % (ref 0.0–1.5)
O2 Saturation: 63 %
Total hemoglobin: 11.4 g/dL — ABNORMAL LOW (ref 12.0–16.0)

## 2022-06-07 LAB — CBC
HCT: 47.6 % — ABNORMAL HIGH (ref 36.0–46.0)
Hemoglobin: 15.2 g/dL — ABNORMAL HIGH (ref 12.0–15.0)
MCH: 28.8 pg (ref 26.0–34.0)
MCHC: 31.9 g/dL (ref 30.0–36.0)
MCV: 90.2 fL (ref 80.0–100.0)
Platelets: 116 10*3/uL — ABNORMAL LOW (ref 150–400)
RBC: 5.28 MIL/uL — ABNORMAL HIGH (ref 3.87–5.11)
RDW: 17.3 % — ABNORMAL HIGH (ref 11.5–15.5)
WBC: 6.6 10*3/uL (ref 4.0–10.5)
nRBC: 0 % (ref 0.0–0.2)

## 2022-06-07 LAB — BASIC METABOLIC PANEL
Anion gap: 8 (ref 5–15)
BUN: 36 mg/dL — ABNORMAL HIGH (ref 8–23)
CO2: 23 mmol/L (ref 22–32)
Calcium: 9.3 mg/dL (ref 8.9–10.3)
Chloride: 110 mmol/L (ref 98–111)
Creatinine, Ser: 1.73 mg/dL — ABNORMAL HIGH (ref 0.44–1.00)
GFR, Estimated: 30 mL/min — ABNORMAL LOW (ref >60)
Glucose, Bld: 136 mg/dL — ABNORMAL HIGH (ref 70–99)
Potassium: 3.9 mmol/L (ref 3.5–5.1)
Sodium: 141 mmol/L (ref 135–145)

## 2022-06-07 MED ORDER — POTASSIUM CHLORIDE CRYS ER 20 MEQ PO TBCR
40.0000 meq | EXTENDED_RELEASE_TABLET | Freq: Once | ORAL | Status: AC
  Administered 2022-06-07: 40 meq via ORAL
  Filled 2022-06-07: qty 2

## 2022-06-07 MED ORDER — SODIUM CHLORIDE 0.9 % IV SOLN: 250.0000 mL | INTRAVENOUS | Status: DC | PRN

## 2022-06-07 MED ORDER — SODIUM CHLORIDE 0.9% FLUSH: 3.0000 mL | INTRAVENOUS | Status: DC | PRN

## 2022-06-07 MED ORDER — SODIUM CHLORIDE 0.9% FLUSH
10.0000 mL | Freq: Two times a day (BID) | INTRAVENOUS | Status: DC
  Administered 2022-06-07 – 2022-06-09 (×5): 10 mL
  Administered 2022-06-09: 40 mL
  Administered 2022-06-10: 10 mL

## 2022-06-07 MED ORDER — SODIUM CHLORIDE 0.9% FLUSH: 10.0000 mL | INTRAVENOUS | Status: DC | PRN

## 2022-06-07 MED ORDER — OXYCODONE HCL 5 MG PO TABS
2.5000 mg | ORAL_TABLET | Freq: Four times a day (QID) | ORAL | Status: DC | PRN
  Administered 2022-06-07 – 2022-06-10 (×3): 2.5 mg via ORAL
  Filled 2022-06-07 (×5): qty 1

## 2022-06-07 MED ORDER — SODIUM CHLORIDE 0.9% FLUSH: 3.0000 mL | Freq: Two times a day (BID) | INTRAVENOUS | Status: DC

## 2022-06-07 MED ORDER — SODIUM CHLORIDE 0.9 % IV SOLN: INTRAVENOUS | Status: DC

## 2022-06-07 MED ORDER — CHLORHEXIDINE GLUCONATE CLOTH 2 % EX PADS
6.0000 | MEDICATED_PAD | Freq: Every day | CUTANEOUS | Status: DC
  Administered 2022-06-07 – 2022-06-10 (×4): 6 via TOPICAL

## 2022-06-07 MED ORDER — MILRINONE LACTATE IN DEXTROSE 20-5 MG/100ML-% IV SOLN
0.1250 ug/kg/min | INTRAVENOUS | Status: DC
  Administered 2022-06-07 – 2022-06-09 (×4): 0.25 ug/kg/min via INTRAVENOUS
  Filled 2022-06-07 (×4): qty 100

## 2022-06-07 NOTE — TOC Progression Note (Signed)
Transition of Care [facility]) - Progression Note    Patient Details  Name: [name] [name] MRN: [id number] Date of Birth: 11-26-1945  Transition of Care [facility]) CM/SW Contact  Zenon Mayo, RN Phone Number: 06/07/2022, 6:56 PM  Clinical Narrative:    Patient is form home alone, has a daughter who occasionally helps her.  Presents with acute on chronic HF, AKI, afib, copd and Thrombocytopenia, conts on IV lasix.  TOC following.        Expected Discharge Plan and Services                                                 Social Determinants of Health (SDOH) Interventions    Readmission Risk Interventions    02/12/2021   10:29 AM 12/09/2020   12:39 PM  Readmission Risk Prevention Plan  Transportation Screening Complete Complete  PCP or Specialist Appt within 3-5 Days Complete Complete  HRI or Home Care Consult Complete Complete  Social Work Consult for [place] Planning/Counseling Complete Complete  Palliative Care Screening Not Applicable Complete  Medication Review Press photographer) Complete Complete

## 2022-06-07 NOTE — Assessment & Plan Note (Signed)
CKD stage 3b  Renal function with serum cr at 1,73 with K at 3,9 and serum bicarbonate at 23. Continue with aggressive diuresis, avoid hypotension and nephrotoxic medications.  Follow up renal function in am.

## 2022-06-07 NOTE — Assessment & Plan Note (Signed)
No clinical signs of acute exacerbation Continue oxymetry monitoring

## 2022-06-07 NOTE — Progress Notes (Signed)
Progress Note   Patient: [name] [name] JOI:[id number] DOB: [birth year] DOA: 06/06/2022     1 DOS: the patient was seen and examined on 06/07/2022   Brief hospital course: [name] [name] was admitted to the hospital with the working diagnosis of heart failure decompensation.   76 yo female with the past medical history of hypertension, dyslipidemia, T2DM, CKD, atrial fibrillation, coronary artery disease and heart failure who presented with dyspnea. Patient has been not talking diuretic therapy for 48 hrs, developing worsening dyspnea that prompted her to come to the ED. On her initial physical examination her blood pressure was 156/106, HR 73, RR 25 and 02 saturation 94%, heart with S1 and S2 present and rhythmic, positive JVD, lungs with no wheezing or rhonchi, abdomen not distended and positive lower extremity edema.   Na 141, K 4,3, CL 115 bicarbonate 17, glucose 105 bun 28 cr 1,62  BNP 1,732 High sensitive troponin 40 and 28  Wbc 6,5 hgb 14.2 plt 108  Urine analysis SG 1,030, protein 100, negative leukocytes.   Chest radiograph with cardiomegaly and bilateral hilar vascular congestion, no effusions.  EKG 70 bpm, left axis deviation, qtc 496, paced rhythm with underlying atrial flutter with PAC, with no significant ST segment or T wave changes.   Patient was placed on furosemide and started on midodrine for low output heart failure.       Assessment and Plan: * Acute on chronic systolic CHF (congestive heart failure) (HCC) Echocardiogram with reduced LV systolic function EF < 47%, global hypokinesis, moderate LVH, RV systolic function with moderate reduction, RVSP 46,1 mmHg. Moderate dilatation of RA and LA. Mild to moderate MR. Moderate TR.   Not documented urine output.  Systolic blood pressure 096 to 130 mmHg   Plan to continue diuresis with furosemide 80 mg IV q12 hrs  Inotropic support with milrinone After load reduction with bidil (hydralazine and isosorbide).   Acute kidney  injury superimposed on chronic kidney disease ([name] [name]) CKD stage 3b  Renal function with serum cr at 1,73 with K at 3,9 and serum bicarbonate at 23. Continue with aggressive diuresis, avoid hypotension and nephrotoxic medications.  Follow up renal function in am.   Coronary artery disease due to lipid rich plaque Continue blood pressure monitoring Patient not on antiplatelet therapy or statin.   Persistent atrial fibrillation ([name] [name]) Patient is reate controlled, continue anticoagulation with rivaroxaban Continue telemetry monitoring,   Essential hypertension Continue blood pressure control with Bidil Continue inotropic support with milrinone   COPD (chronic obstructive pulmonary disease) (HCC) No clinical signs of acute exacerbation Continue oxymetry monitoring   Controlled type 2 diabetes mellitus without complication, without long-term current use of insulin (HCC) Her fasting glucose is 96 Plan to continue to hold on insulin therapy for now, continue capillary glucose monitoring as needed.   Thrombocytopenia (HCC) plt at 116 continue close monitoring.         Subjective: Patient is feeling better, her dyspnea is improving but not back to baseline   Physical Exam: Vitals:   06/07/22 0728 06/07/22 0815 06/07/22 0925 06/07/22 1213  BP: 135/87  124/76 136/88  Pulse: 67  73 88  Resp: '20  20 20  '$ Temp: (!) 97.3 F (36.3 C)  98 F (36.7 C) 97.8 F (36.6 C)  TempSrc: Oral  Oral Oral  SpO2: 97% 97% 94% 94%  Weight:      Height:       Neurology awake and alert ENT with no pallor  Cardiovascular with S1  and S2 present, irregularly irregular, positive S3 gallop Positive JVD No lower extremity edema Respiratory with no rales or wheezing Abdomen with no distention   Data Reviewed:    Family Communication: no family at the bedside   Disposition: Status is: Inpatient Remains inpatient appropriate because: heart failure   Planned Discharge Destination:  Home  Author: Tawni Millers, MD 06/07/2022 12:26 PM  For on call review [email or web address].

## 2022-06-07 NOTE — Consult Note (Signed)
   [facility] Inpatient Consult   06/07/2022  [name] [name] 1946/05/06 [id number]  [employer] [ACO] Patient: [employer]  Primary Care Provider:  Janith Lima, MD, [place] [facility] is a provider that is listed for the Transition of Care follow up for calls/appointment  Patient screened for hospitalization with noted high risk score for unplanned readmission risk and to assess for potential [place] Management service needs for post hospital transition.  Went to bedside to speak with patient and she is currently asleep. Placed 24 hour nurse advise line and reminder for post hospital follow up appointment. Will continue to round on patient for post hospital follow up needs. Reviewed electronic medical record for potential follow up needs.   Plan:  Continue to follow progress and disposition to assess for post hospital care management needs.    For questions contact:   Natividad Brood, RN BSN [place] [facility] Liaison  [phone] business mobile phone Toll free office [phone]  Fax number: [phone] [email or web address]

## 2022-06-07 NOTE — Assessment & Plan Note (Signed)
Continue blood pressure control with Bidil Continue inotropic support with milrinone

## 2022-06-07 NOTE — Progress Notes (Signed)
   06/07/22 1000  Mobility  Activity Ambulated with assistance in hallway  Level of Assistance Contact guard assist, steadying assist  Assistive Device None  Distance Ambulated (ft) 300 ft  Activity Response Tolerated well  Mobility Referral Yes  $Mobility charge 1 Mobility   Mobility Specialist Progress Note  During Mobility: 92-96% SpO2  Received pt EOB having no complaints and agreeable to mobility. Pt was asymptomatic throughout ambulation and returned to room w/o fault. Left EOB w/ call bell in reach and all needs met.   Lucious Groves Mobility Specialist

## 2022-06-07 NOTE — Assessment & Plan Note (Signed)
Continue blood pressure monitoring Patient not on antiplatelet therapy or statin.

## 2022-06-07 NOTE — H&P (View-Only) (Signed)
Advanced Heart Failure Rounding Note  PCP-Cardiologist: [name], MD   Subjective:    Patient refused PICC. Uncooperative in ED yesterday. Lost IV access last night, peripheral IV pulled out.   Lactic acid 2.1>2.8>2.2 (last 1:30 AM)  Is/Os incomplete but RN reports good diuresis.   Shortness of breath improving.      Objective:   Weight Range: 86.2 kg Body mass index is 29.76 kg/m.   Vital Signs:   Temp:  [97.3 F (36.3 C)-98.2 F (36.8 C)] 97.3 F (36.3 C) (10/24 0728) Pulse Rate:  [67-141] 67 (10/24 0728) Resp:  [14-29] 20 (10/24 0728) BP: (116-159)/(69-106) 135/87 (10/24 0728) SpO2:  [89 %-99 %] 97 % (10/24 0815) Weight:  [86.2 kg-86.6 kg] 86.2 kg (10/24 0400) Last BM Date : 06/06/22  Weight change: Filed Weights   06/06/22 2057 06/07/22 0400  Weight: 86.6 kg 86.2 kg    Intake/Output:  No intake or output data in the 24 hours ending 06/07/22 0835    Physical Exam    General:  Lying in bed. Fatigued appearing. HEENT: Normal Neck: Supple. JVP 10 cm. Carotids 2+ bilat; no bruits.  Cor: PMI nondisplaced. Regular rate & rhythm. No rubs, gallops or murmurs. Lungs: Clear Abdomen: Soft, nontender, nondistended.  Extremities: No cyanosis, clubbing, rash, 1+ edema Neuro: Lethargic   Telemetry   Afib 80s, ~ 5 PVCs/min, V paced (personally reviewed)  Labs    CBC Recent Labs    06/06/22 0615 06/07/22 0130  WBC 6.5 6.6  HGB 14.2 15.2*  HCT 44.5 47.6*  MCV 91.2 90.2  PLT 108* 932*   Basic Metabolic Panel Recent Labs    06/06/22 0615 06/07/22 0130  NA 141 141  K 4.3 3.9  CL 115* 110  CO2 17* 23  GLUCOSE 105* 136*  BUN 28* 36*  CREATININE 1.62* 1.73*  CALCIUM 9.1 9.3   Liver Function Tests No results for input(s): "AST", "ALT", "ALKPHOS", "BILITOT", "PROT", "ALBUMIN" in the last 72 hours. No results for input(s): "LIPASE", "AMYLASE" in the last 72 hours. Cardiac Enzymes No results for input(s): "CKTOTAL", "CKMB", "CKMBINDEX",  "TROPONINI" in the last 72 hours.  BNP: BNP (last 3 results) Recent Labs    04/28/22 1203 05/03/22 1003 06/06/22 0615  BNP 1,166.4* 684.0* 1,732.7*    ProBNP (last 3 results) No results for input(s): "PROBNP" in the last 8760 hours.   D-Dimer No results for input(s): "DDIMER" in the last 72 hours. Hemoglobin A1C No results for input(s): "HGBA1C" in the last 72 hours. Fasting Lipid Panel No results for input(s): "CHOL", "HDL", "LDLCALC", "TRIG", "CHOLHDL", "LDLDIRECT" in the last 72 hours. Thyroid Function Tests No results for input(s): "TSH", "T4TOTAL", "T3FREE", "THYROIDAB" in the last 72 hours.  Invalid input(s): "FREET3"  Other results:   Imaging    ECHOCARDIOGRAM COMPLETE  Result Date: 06/06/2022    ECHOCARDIOGRAM REPORT   Patient Name:   [name] [name] Date of Exam: 06/06/2022 Medical Rec #:  [id number]     Height:       67.5 in Accession #:    [id number]    Weight:       188.0 lb Date of Birth:  [birth year]     BSA:          1.981 m Patient Age:    76 years      BP:           152/90 mmHg Patient Gender: F  HR:           75 bpm. Exam Location:  Inpatient Procedure: 2D Echo, Cardiac Doppler and Color Doppler Indications:    Congenital Heart Disease Q24.0  History:        Patient has prior history of Echocardiogram examinations.                 Cardiomyopathy, CAD, Prior CABG and Pacemaker, COPD,                 Arrythmias:Atrial Fibrillation; Risk Factors:Hypertension.  Sonographer:    Eartha Inch Referring Phys: [id number] AMY D CLEGG  Sonographer Comments: Image acquisition challenging due to patient body habitus and Image acquisition challenging due to respiratory motion. IMPRESSIONS  1. Left ventricular ejection fraction, by estimation, is 20%. The left ventricle has severely decreased function. The left ventricle demonstrates global hypokinesis. There is moderate concentric left ventricular hypertrophy. Left ventricular diastolic parameters are indeterminate.  2.  Right ventricular systolic function is moderately reduced. The right ventricular size is normal. There is moderately elevated pulmonary artery systolic pressure. The estimated right ventricular systolic pressure is 24.0 mmHg.  3. Left atrial size was moderately dilated.  4. Right atrial size was moderately dilated.  5. The mitral valve is abnormal. Mild to moderate mitral valve regurgitation. No evidence of mitral stenosis.  6. The tricuspid valve is abnormal. Tricuspid valve regurgitation is moderate.  7. The aortic valve is tricuspid. There is mild calcification of the aortic valve. Aortic valve regurgitation is trivial. No aortic stenosis is present.  8. The inferior vena cava is dilated in size with <50% respiratory variability, suggesting right atrial pressure of 15 mmHg.  9. The patient is in atrial fibrillation. FINDINGS  Left Ventricle: Left ventricular ejection fraction, by estimation, is 20%. The left ventricle has severely decreased function. The left ventricle demonstrates global hypokinesis. The left ventricular internal cavity size was normal in size. There is moderate concentric left ventricular hypertrophy. Left ventricular diastolic parameters are indeterminate. Right Ventricle: The right ventricular size is normal. No increase in right ventricular wall thickness. Right ventricular systolic function is moderately reduced. There is moderately elevated pulmonary artery systolic pressure. The tricuspid regurgitant velocity is 2.79 m/s, and with an assumed right atrial pressure of 15 mmHg, the estimated right ventricular systolic pressure is 97.3 mmHg. Left Atrium: Left atrial size was moderately dilated. Right Atrium: Right atrial size was moderately dilated. Pericardium: There is no evidence of pericardial effusion. Mitral Valve: The mitral valve is abnormal. There is mild calcification of the mitral valve leaflet(s). Mild mitral annular calcification. Mild to moderate mitral valve regurgitation. No  evidence of mitral valve stenosis. Tricuspid Valve: The tricuspid valve is abnormal. Tricuspid valve regurgitation is moderate. Aortic Valve: The aortic valve is tricuspid. There is mild calcification of the aortic valve. Aortic valve regurgitation is trivial. No aortic stenosis is present. Pulmonic Valve: The pulmonic valve was normal in structure. Pulmonic valve regurgitation is not visualized. Aorta: The aortic root is normal in size and structure. Venous: The inferior vena cava is dilated in size with less than 50% respiratory variability, suggesting right atrial pressure of 15 mmHg. IAS/Shunts: No atrial level shunt detected by color flow Doppler. Additional Comments: A device lead is visualized in the right ventricle.  LEFT VENTRICLE PLAX 2D LVIDd:         5.70 cm      Diastology LVIDs:         4.60 cm      LV e'  medial:    4.28 cm/s LV PW:         0.90 cm      LV E/e' medial:  20.4 LV IVS:        0.90 cm      LV e' lateral:   11.50 cm/s LVOT diam:     2.00 cm      LV E/e' lateral: 7.6 LV SV:         41 LV SV Index:   21 LVOT Area:     3.14 cm  LV Volumes (MOD) LV vol d, MOD A2C: 139.0 ml LV vol d, MOD A4C: 128.0 ml LV vol s, MOD A2C: 119.0 ml LV vol s, MOD A4C: 92.3 ml LV SV MOD A2C:     20.0 ml LV SV MOD A4C:     128.0 ml LV SV MOD BP:      31.4 ml RIGHT VENTRICLE            IVC RV S prime:     7.54 cm/s  IVC diam: 2.20 cm TAPSE (M-mode): 1.5 cm LEFT ATRIUM           Index LA diam:      5.30 cm 2.68 cm/m LA Vol (A4C): 84.2 ml 42.51 ml/m  AORTIC VALVE LVOT Vmax:   109.00 cm/s LVOT Vmean:  68.300 cm/s LVOT VTI:    0.132 m  AORTA Ao Root diam: 2.50 cm MITRAL VALVE               TRICUSPID VALVE MV Area (PHT): 4.12 cm    TR Peak grad:   31.1 mmHg MV Decel Time: 184 msec    TR Mean grad:   21.0 mmHg MR Peak grad: 111.1 mmHg   TR Vmax:        279.00 cm/s MR Mean grad: 75.0 mmHg    TR Vmean:       222.0 cm/s MR Vmax:      527.00 cm/s MR Vmean:     414.0 cm/s   SHUNTS MV E velocity: 87.50 cm/s  Systemic VTI:   0.13 m                            Systemic Diam: 2.00 cm Dalton McleanMD Electronically signed by Franki Monte Signature Date/Time: 06/06/2022/3:51:09 PM    Final    [place] EKG SITE RITE  Result Date: 06/06/2022 If Site Rite image not attached, placement could not be confirmed due to current cardiac rhythm.    Medications:     Scheduled Medications:  fluticasone furoate-vilanterol  1 puff Inhalation Daily   And   umeclidinium bromide  1 puff Inhalation Daily   furosemide  80 mg Intravenous BID   isosorbide-hydrALAZINE  1 tablet Oral TID   rivaroxaban  20 mg Oral Q supper   sodium chloride flush  3 mL Intravenous Q12H    Infusions:   PRN Medications: acetaminophen **OR** acetaminophen, albuterol, naphazoline-glycerin, ondansetron **OR** ondansetron (ZOFRAN) IV    Patient Profile   [name] [name] is a 76 year old with a h/o severe MR, chronic HFrEF, HTN, permanent atrial fib, CAD s/p CABG x 2 (LIMA to LAD and SVG to OM) in 2016, HLD, IDA, COPD, and Biotronik PPM. GDMT limited - taken off SGLT2i due to UTI/yeast and intolerant entresto due to chest pain.    Presented with increased shortness of breath and chest pain. Out torsemide for some time. Admitted with A/C  HFrEF possible low output HF.   Assessment/Plan   1. A/C HFrEF,  - Suspect Combined ICM /NICM with previous A fib RVR. RV Pacing? - In 2021 EF improved 55-60% but over the last year Ef has been down 25-30 with severe MR/TR - Echo this admit EF 20%, moderate LVH, RV moderately reduced, RVSP 46 mmHg, moderate BAE, mild to moderate MR, moderate TR, dilated IVC NYHA IV. BNP 1732. CXR with vascular congestion.  Prior to admit, she was off torsemide. Refer back to HF Paramedicine.  -Marked volume overload on admit.  Concerned about possible low-output. Lactic acid 2.1>2.8>2.2, recheck to ensure clearance. CO2 17> 23. Refused PICC line last night. Agrees after long discussion this am. - Start milrinone 0.25 mcg/kg/min - Is/Os not  complete but RN reports good output. Volume improving on exam. Stop IV lasix after PM dose. -BP improving with Bidil -No SGLT2i due UTI/yeast infections.  -Failed entresto in the past due to chest pain.  -No spiro for now.  -Arrange for RHC tomorrow to assess hemodynamics.   2. AKI  -Creatinine baseline  1.2-1.4 -Scr this admit 1.6>1.73 -Watch closely with diuresis   3. MR, Previously Severe - Mild to moderate on echo this admit   4. Permanent A fib  - Biotronik dual chamber PPM/AV nodal ablation 2022.  - Followed by EP for possible upgrade but she has not wanted to pursue.  -Rate controlled.  -Continue xarelto    5. CAD -H/O CABG x2 2016(LIMA to LAD and SVG to OM)  -HS Trop, 40>28 -No chest pain.  -Continue statin   6. COPD     Refer back to HF Paramedicine for assistance with medications. She is at high risk for readmits.    Length of Stay: 1  [name] [name], [name] N, PA-C  06/07/2022, 8:35 AM  Advanced Heart Failure Team Pager [phone] (M-F; 7a - 5p)  Please contact [facility] for night-coverage after hours (5p -7a ) and weekends on [email or web address]  Patient seen and examined with the above-signed Advanced Practice Provider and/or Housestaff. I personally reviewed laboratory data, imaging studies and relevant notes. I independently examined the patient and formulated the important aspects of the plan. I have edited the note to reflect any of my changes or salient points. I have personally discussed the plan with the patient and/or family.  Good urine output with IV lasix. Weight unchanged but feeling better. Less dyspnea. Refused PICC earlier. Lactate coming down.   General:  Lying in bed  No resp difficulty HEENT: normal Neck: supple. JVP 7-8 Carotids 2+ bilat; no bruits. No lymphadenopathy or thryomegaly appreciated. Cor: PMI nondisplaced. Irregular rate & rhythm. 2/6 TR Lungs: clear Abdomen: soft, nontender, nondistended. No hepatosplenomegaly. No bruits or masses.  Good bowel sounds. Extremities: no cyanosis, clubbing, rash, tr edema Neuro: alert & orientedx3, cranial nerves grossly intact. moves all 4 extremities w/o difficulty. Affect pleasant  Improved but still tenuous. Will need PICC to follow Co-ox and CVP. Recheck lactate. Plan RHC tomorrow. Continue IV lasix for now.  [name] Bickers, MD  10:11 AM

## 2022-06-07 NOTE — Assessment & Plan Note (Signed)
plt at 116 continue close monitoring.

## 2022-06-07 NOTE — Progress Notes (Addendum)
Advanced Heart Failure Rounding Note  PCP-Cardiologist: [name], MD   Subjective:    Patient refused PICC. Uncooperative in ED yesterday. Lost IV access last night, peripheral IV pulled out.   Lactic acid 2.1>2.8>2.2 (last 1:30 AM)  Is/Os incomplete but RN reports good diuresis.   Shortness of breath improving.      Objective:   Weight Range: 86.2 kg Body mass index is 29.76 kg/m.   Vital Signs:   Temp:  [97.3 F (36.3 C)-98.2 F (36.8 C)] 97.3 F (36.3 C) (10/24 0728) Pulse Rate:  [67-141] 67 (10/24 0728) Resp:  [14-29] 20 (10/24 0728) BP: (116-159)/(69-106) 135/87 (10/24 0728) SpO2:  [89 %-99 %] 97 % (10/24 0815) Weight:  [86.2 kg-86.6 kg] 86.2 kg (10/24 0400) Last BM Date : 06/06/22  Weight change: Filed Weights   06/06/22 2057 06/07/22 0400  Weight: 86.6 kg 86.2 kg    Intake/Output:  No intake or output data in the 24 hours ending 06/07/22 0835    Physical Exam    General:  Lying in bed. Fatigued appearing. HEENT: Normal Neck: Supple. JVP 10 cm. Carotids 2+ bilat; no bruits.  Cor: PMI nondisplaced. Regular rate & rhythm. No rubs, gallops or murmurs. Lungs: Clear Abdomen: Soft, nontender, nondistended.  Extremities: No cyanosis, clubbing, rash, 1+ edema Neuro: Lethargic   Telemetry   Afib 80s, ~ 5 PVCs/min, V paced (personally reviewed)  Labs    CBC Recent Labs    06/06/22 0615 06/07/22 0130  WBC 6.5 6.6  HGB 14.2 15.2*  HCT 44.5 47.6*  MCV 91.2 90.2  PLT 108* 628*   Basic Metabolic Panel Recent Labs    06/06/22 0615 06/07/22 0130  NA 141 141  K 4.3 3.9  CL 115* 110  CO2 17* 23  GLUCOSE 105* 136*  BUN 28* 36*  CREATININE 1.62* 1.73*  CALCIUM 9.1 9.3   Liver Function Tests No results for input(s): "AST", "ALT", "ALKPHOS", "BILITOT", "PROT", "ALBUMIN" in the last 72 hours. No results for input(s): "LIPASE", "AMYLASE" in the last 72 hours. Cardiac Enzymes No results for input(s): "CKTOTAL", "CKMB", "CKMBINDEX",  "TROPONINI" in the last 72 hours.  BNP: BNP (last 3 results) Recent Labs    04/28/22 1203 05/03/22 1003 06/06/22 0615  BNP 1,166.4* 684.0* 1,732.7*    ProBNP (last 3 results) No results for input(s): "PROBNP" in the last 8760 hours.   D-Dimer No results for input(s): "DDIMER" in the last 72 hours. Hemoglobin A1C No results for input(s): "HGBA1C" in the last 72 hours. Fasting Lipid Panel No results for input(s): "CHOL", "HDL", "LDLCALC", "TRIG", "CHOLHDL", "LDLDIRECT" in the last 72 hours. Thyroid Function Tests No results for input(s): "TSH", "T4TOTAL", "T3FREE", "THYROIDAB" in the last 72 hours.  Invalid input(s): "FREET3"  Other results:   Imaging    ECHOCARDIOGRAM COMPLETE  Result Date: 06/06/2022    ECHOCARDIOGRAM REPORT   Patient Name:   [name] [name] Date of Exam: 06/06/2022 Medical Rec #:  [id number]     Height:       67.5 in Accession #:    [id number]    Weight:       188.0 lb Date of Birth:  April 23, 1946     BSA:          1.981 m Patient Age:    76 years      BP:           152/90 mmHg Patient Gender: F  HR:           75 bpm. Exam Location:  Inpatient Procedure: 2D Echo, Cardiac Doppler and Color Doppler Indications:    Congenital Heart Disease Q24.0  History:        Patient has prior history of Echocardiogram examinations.                 Cardiomyopathy, CAD, Prior CABG and Pacemaker, COPD,                 Arrythmias:Atrial Fibrillation; Risk Factors:Hypertension.  Sonographer:    Eartha Inch Referring Phys: [id number] AMY D CLEGG  Sonographer Comments: Image acquisition challenging due to patient body habitus and Image acquisition challenging due to respiratory motion. IMPRESSIONS  1. Left ventricular ejection fraction, by estimation, is 20%. The left ventricle has severely decreased function. The left ventricle demonstrates global hypokinesis. There is moderate concentric left ventricular hypertrophy. Left ventricular diastolic parameters are indeterminate.  2.  Right ventricular systolic function is moderately reduced. The right ventricular size is normal. There is moderately elevated pulmonary artery systolic pressure. The estimated right ventricular systolic pressure is 37.3 mmHg.  3. Left atrial size was moderately dilated.  4. Right atrial size was moderately dilated.  5. The mitral valve is abnormal. Mild to moderate mitral valve regurgitation. No evidence of mitral stenosis.  6. The tricuspid valve is abnormal. Tricuspid valve regurgitation is moderate.  7. The aortic valve is tricuspid. There is mild calcification of the aortic valve. Aortic valve regurgitation is trivial. No aortic stenosis is present.  8. The inferior vena cava is dilated in size with <50% respiratory variability, suggesting right atrial pressure of 15 mmHg.  9. The patient is in atrial fibrillation. FINDINGS  Left Ventricle: Left ventricular ejection fraction, by estimation, is 20%. The left ventricle has severely decreased function. The left ventricle demonstrates global hypokinesis. The left ventricular internal cavity size was normal in size. There is moderate concentric left ventricular hypertrophy. Left ventricular diastolic parameters are indeterminate. Right Ventricle: The right ventricular size is normal. No increase in right ventricular wall thickness. Right ventricular systolic function is moderately reduced. There is moderately elevated pulmonary artery systolic pressure. The tricuspid regurgitant velocity is 2.79 m/s, and with an assumed right atrial pressure of 15 mmHg, the estimated right ventricular systolic pressure is 42.8 mmHg. Left Atrium: Left atrial size was moderately dilated. Right Atrium: Right atrial size was moderately dilated. Pericardium: There is no evidence of pericardial effusion. Mitral Valve: The mitral valve is abnormal. There is mild calcification of the mitral valve leaflet(s). Mild mitral annular calcification. Mild to moderate mitral valve regurgitation. No  evidence of mitral valve stenosis. Tricuspid Valve: The tricuspid valve is abnormal. Tricuspid valve regurgitation is moderate. Aortic Valve: The aortic valve is tricuspid. There is mild calcification of the aortic valve. Aortic valve regurgitation is trivial. No aortic stenosis is present. Pulmonic Valve: The pulmonic valve was normal in structure. Pulmonic valve regurgitation is not visualized. Aorta: The aortic root is normal in size and structure. Venous: The inferior vena cava is dilated in size with less than 50% respiratory variability, suggesting right atrial pressure of 15 mmHg. IAS/Shunts: No atrial level shunt detected by color flow Doppler. Additional Comments: A device lead is visualized in the right ventricle.  LEFT VENTRICLE PLAX 2D LVIDd:         5.70 cm      Diastology LVIDs:         4.60 cm      LV e'  medial:    4.28 cm/s LV PW:         0.90 cm      LV E/e' medial:  20.4 LV IVS:        0.90 cm      LV e' lateral:   11.50 cm/s LVOT diam:     2.00 cm      LV E/e' lateral: 7.6 LV SV:         41 LV SV Index:   21 LVOT Area:     3.14 cm  LV Volumes (MOD) LV vol d, MOD A2C: 139.0 ml LV vol d, MOD A4C: 128.0 ml LV vol s, MOD A2C: 119.0 ml LV vol s, MOD A4C: 92.3 ml LV SV MOD A2C:     20.0 ml LV SV MOD A4C:     128.0 ml LV SV MOD BP:      31.4 ml RIGHT VENTRICLE            IVC RV S prime:     7.54 cm/s  IVC diam: 2.20 cm TAPSE (M-mode): 1.5 cm LEFT ATRIUM           Index LA diam:      5.30 cm 2.68 cm/m LA Vol (A4C): 84.2 ml 42.51 ml/m  AORTIC VALVE LVOT Vmax:   109.00 cm/s LVOT Vmean:  68.300 cm/s LVOT VTI:    0.132 m  AORTA Ao Root diam: 2.50 cm MITRAL VALVE               TRICUSPID VALVE MV Area (PHT): 4.12 cm    TR Peak grad:   31.1 mmHg MV Decel Time: 184 msec    TR Mean grad:   21.0 mmHg MR Peak grad: 111.1 mmHg   TR Vmax:        279.00 cm/s MR Mean grad: 75.0 mmHg    TR Vmean:       222.0 cm/s MR Vmax:      527.00 cm/s MR Vmean:     414.0 cm/s   SHUNTS MV E velocity: 87.50 cm/s  Systemic VTI:   0.13 m                            Systemic Diam: 2.00 cm Dalton McleanMD Electronically signed by Franki Monte Signature Date/Time: 06/06/2022/3:51:09 PM    Final    [place] EKG SITE RITE  Result Date: 06/06/2022 If Site Rite image not attached, placement could not be confirmed due to current cardiac rhythm.    Medications:     Scheduled Medications:  fluticasone furoate-vilanterol  1 puff Inhalation Daily   And   umeclidinium bromide  1 puff Inhalation Daily   furosemide  80 mg Intravenous BID   isosorbide-hydrALAZINE  1 tablet Oral TID   rivaroxaban  20 mg Oral Q supper   sodium chloride flush  3 mL Intravenous Q12H    Infusions:   PRN Medications: acetaminophen **OR** acetaminophen, albuterol, naphazoline-glycerin, ondansetron **OR** ondansetron (ZOFRAN) IV    Patient Profile   [name] [name] is a 76 year old with a h/o severe MR, chronic HFrEF, HTN, permanent atrial fib, CAD s/p CABG x 2 (LIMA to LAD and SVG to OM) in 2016, HLD, IDA, COPD, and Biotronik PPM. GDMT limited - taken off SGLT2i due to UTI/yeast and intolerant entresto due to chest pain.    Presented with increased shortness of breath and chest pain. Out torsemide for some time. Admitted with A/C  HFrEF possible low output HF.   Assessment/Plan   1. A/C HFrEF,  - Suspect Combined ICM /NICM with previous A fib RVR. RV Pacing? - In 2021 EF improved 55-60% but over the last year Ef has been down 25-30 with severe MR/TR - Echo this admit EF 20%, moderate LVH, RV moderately reduced, RVSP 46 mmHg, moderate BAE, mild to moderate MR, moderate TR, dilated IVC NYHA IV. BNP 1732. CXR with vascular congestion.  Prior to admit, she was off torsemide. Refer back to HF Paramedicine.  -Marked volume overload on admit.  Concerned about possible low-output. Lactic acid 2.1>2.8>2.2, recheck to ensure clearance. CO2 17> 23. Refused PICC line last night. Agrees after long discussion this am. - Start milrinone 0.25 mcg/kg/min - Is/Os not  complete but RN reports good output. Volume improving on exam. Stop IV lasix after PM dose. -BP improving with Bidil -No SGLT2i due UTI/yeast infections.  -Failed entresto in the past due to chest pain.  -No spiro for now.  -Arrange for RHC tomorrow to assess hemodynamics.   2. AKI  -Creatinine baseline  1.2-1.4 -Scr this admit 1.6>1.73 -Watch closely with diuresis   3. MR, Previously Severe - Mild to moderate on echo this admit   4. Permanent A fib  - Biotronik dual chamber PPM/AV nodal ablation 2022.  - Followed by EP for possible upgrade but she has not wanted to pursue.  -Rate controlled.  -Continue xarelto    5. CAD -H/O CABG x2 2016(LIMA to LAD and SVG to OM)  -HS Trop, 40>28 -No chest pain.  -Continue statin   6. COPD     Refer back to HF Paramedicine for assistance with medications. She is at high risk for readmits.    Length of Stay: 1  FINCH, LINDSAY N, PA-C  06/07/2022, 8:35 AM  Advanced Heart Failure Team Pager [phone] (M-F; 7a - 5p)  Please contact [facility] for night-coverage after hours (5p -7a ) and weekends on [email or web address]  Patient seen and examined with the above-signed Advanced Practice Provider and/or Housestaff. I personally reviewed laboratory data, imaging studies and relevant notes. I independently examined the patient and formulated the important aspects of the plan. I have edited the note to reflect any of my changes or salient points. I have personally discussed the plan with the patient and/or family.  Good urine output with IV lasix. Weight unchanged but feeling better. Less dyspnea. Refused PICC earlier. Lactate coming down.   General:  Lying in bed  No resp difficulty HEENT: normal Neck: supple. JVP 7-8 Carotids 2+ bilat; no bruits. No lymphadenopathy or thryomegaly appreciated. Cor: PMI nondisplaced. Irregular rate & rhythm. 2/6 TR Lungs: clear Abdomen: soft, nontender, nondistended. No hepatosplenomegaly. No bruits or masses.  Good bowel sounds. Extremities: no cyanosis, clubbing, rash, tr edema Neuro: alert & orientedx3, cranial nerves grossly intact. moves all 4 extremities w/o difficulty. Affect pleasant  Improved but still tenuous. Will need PICC to follow Co-ox and CVP. Recheck lactate. Plan RHC tomorrow. Continue IV lasix for now.  [name], MD  10:11 AM

## 2022-06-07 NOTE — Progress Notes (Signed)
Pt with no IV, agreed to have new IV placed this morning so Lasix can be given. Asked about PICC line, stated she will think about it. Patient alert an oriented x4.  Will continue to monitor.

## 2022-06-07 NOTE — Progress Notes (Signed)
Peripherally Inserted Central Catheter Placement  The IV Nurse has discussed with the patient and/or persons authorized to consent for the patient, the purpose of this procedure and the potential benefits and risks involved with this procedure.  The benefits include less needle sticks, lab draws from the catheter, and the patient may be discharged home with the catheter. Risks include, but not limited to, infection, bleeding, blood clot (thrombus formation), and puncture of an artery; nerve damage and irregular heartbeat and possibility to perform a PICC exchange if needed/ordered by physician.  Alternatives to this procedure were also discussed.  Bard Power PICC patient education guide, fact sheet on infection prevention and patient information card has been provided to patient /or left at bedside.    PICC Placement Documentation  PICC Double Lumen 06/07/22 Right Brachial 37 cm 0 cm (Active)  Indication for Insertion or Continuance of Line Chronic illness with exacerbations (CF, Sickle Cell, etc.) 06/07/22 1109  Exposed Catheter (cm) 0 cm 06/07/22 1109  Site Assessment Clean, Dry, Intact 06/07/22 1109  Lumen #1 Status Flushed;Blood return noted;Saline locked 06/07/22 1109  Lumen #2 Status Flushed;Blood return noted;Saline locked 06/07/22 1109  Dressing Type Transparent 06/07/22 1109  Dressing Status Antimicrobial disc in place 06/07/22 [place] [employer] checked and tightened 06/07/22 1109  Dressing Intervention New dressing 06/07/22 1109  Dressing Change Due 06/14/22 06/07/22 [place], Pauline Trainer Ramos 06/07/2022, 11:12 AM

## 2022-06-07 NOTE — Assessment & Plan Note (Signed)
Patient is reate controlled, continue anticoagulation with rivaroxaban Continue telemetry monitoring,

## 2022-06-07 NOTE — Assessment & Plan Note (Addendum)
Echocardiogram with reduced LV systolic function EF < 40%, global hypokinesis, moderate LVH, RV systolic function with moderate reduction, RVSP 46,1 mmHg. Moderate dilatation of RA and LA. Mild to moderate MR. Moderate TR.   Not documented urine output.  Systolic blood pressure 102 to 130 mmHg   Plan to continue diuresis with furosemide 80 mg IV q12 hrs  Inotropic support with milrinone After load reduction with bidil (hydralazine and isosorbide).

## 2022-06-07 NOTE — Hospital Course (Signed)
[name] [name] was admitted to the hospital with the working diagnosis of heart failure decompensation.   76 yo female with the past medical history of hypertension, dyslipidemia, T2DM, CKD, atrial fibrillation, coronary artery disease and heart failure who presented with dyspnea. Patient has been not talking diuretic therapy for 48 hrs, developing worsening dyspnea that prompted her to come to the ED. On her initial physical examination her blood pressure was 156/106, HR 73, RR 25 and 02 saturation 94%, heart with S1 and S2 present and rhythmic, positive JVD, lungs with no wheezing or rhonchi, abdomen not distended and positive lower extremity edema.   Na 141, K 4,3, CL 115 bicarbonate 17, glucose 105 bun 28 cr 1,62  BNP 1,732 High sensitive troponin 40 and 28  Wbc 6,5 hgb 14.2 plt 108  Urine analysis SG 1,030, protein 100, negative leukocytes.   Chest radiograph with cardiomegaly and bilateral hilar vascular congestion, no effusions.  EKG 70 bpm, left axis deviation, qtc 496, paced rhythm with underlying atrial flutter with PAC, with no significant ST segment or T wave changes.   Patient was placed on furosemide and started on midodrine for low output heart failure.

## 2022-06-07 NOTE — Assessment & Plan Note (Signed)
Her fasting glucose is 96 Plan to continue to hold on insulin therapy for now, continue capillary glucose monitoring as needed.

## 2022-06-08 ENCOUNTER — Encounter (HOSPITAL_COMMUNITY): Payer: Self-pay | Admitting: Internal Medicine

## 2022-06-08 ENCOUNTER — Encounter (HOSPITAL_COMMUNITY)
Admission: EM | Disposition: A | Discharge status: Home / Self Care | Payer: Self-pay | Source: Home / Self Care | Attending: Internal Medicine

## 2022-06-08 ENCOUNTER — Other Ambulatory Visit (HOSPITAL_COMMUNITY): Payer: Self-pay

## 2022-06-08 ENCOUNTER — Telehealth (HOSPITAL_COMMUNITY): Payer: Self-pay | Admitting: Pharmacy Technician

## 2022-06-08 DIAGNOSIS — I5023 Acute on chronic systolic (congestive) heart failure: Secondary | ICD-10-CM | POA: Diagnosis not present

## 2022-06-08 DIAGNOSIS — N179 Acute kidney failure, unspecified: Secondary | ICD-10-CM | POA: Diagnosis not present

## 2022-06-08 HISTORY — PX: RIGHT HEART CATH: CATH118263

## 2022-06-08 LAB — COOXEMETRY PANEL
Carboxyhemoglobin: 1.4 % (ref 0.5–1.5)
Methemoglobin: <0.7 % (ref 0.0–1.5)
O2 Saturation: 47 %
Total hemoglobin: 14.5 g/dL (ref 12.0–16.0)

## 2022-06-08 LAB — POCT I-STAT EG7
Acid-Base Excess: 1 mmol/L (ref 0.0–2.0)
Acid-base deficit: 1 mmol/L (ref 0.0–2.0)
Bicarbonate: 22 mmol/L (ref 20.0–28.0)
Bicarbonate: 24.6 mmol/L (ref 20.0–28.0)
Calcium, Ion: 0.87 mmol/L — CL (ref 1.15–1.40)
Calcium, Ion: 1.09 mmol/L — ABNORMAL LOW (ref 1.15–1.40)
HCT: 43 % (ref 36.0–46.0)
HCT: 47 % — ABNORMAL HIGH (ref 36.0–46.0)
Hemoglobin: 14.6 g/dL (ref 12.0–15.0)
Hemoglobin: 16 g/dL — ABNORMAL HIGH (ref 12.0–15.0)
O2 Saturation: 68 %
O2 Saturation: 71 %
Potassium: 2.8 mmol/L — ABNORMAL LOW (ref 3.5–5.1)
Potassium: 3.3 mmol/L — ABNORMAL LOW (ref 3.5–5.1)
Sodium: 144 mmol/L (ref 135–145)
Sodium: 147 mmol/L — ABNORMAL HIGH (ref 135–145)
TCO2: 23 mmol/L (ref 22–32)
TCO2: 26 mmol/L (ref 22–32)
pCO2, Ven: 31.1 mmHg — ABNORMAL LOW (ref 44–60)
pCO2, Ven: 34.2 mmHg — ABNORMAL LOW (ref 44–60)
pH, Ven: 7.457 — ABNORMAL HIGH (ref 7.25–7.43)
pH, Ven: 7.465 — ABNORMAL HIGH (ref 7.25–7.43)
pO2, Ven: 33 mmHg (ref 32–45)
pO2, Ven: 34 mmHg (ref 32–45)

## 2022-06-08 LAB — BASIC METABOLIC PANEL
Anion gap: 9 (ref 5–15)
BUN: 29 mg/dL — ABNORMAL HIGH (ref 8–23)
CO2: 24 mmol/L (ref 22–32)
Calcium: 9 mg/dL (ref 8.9–10.3)
Chloride: 107 mmol/L (ref 98–111)
Creatinine, Ser: 1.36 mg/dL — ABNORMAL HIGH (ref 0.44–1.00)
GFR, Estimated: 40 mL/min — ABNORMAL LOW (ref >60)
Glucose, Bld: 115 mg/dL — ABNORMAL HIGH (ref 70–99)
Potassium: 3.6 mmol/L (ref 3.5–5.1)
Sodium: 140 mmol/L (ref 135–145)

## 2022-06-08 LAB — MAGNESIUM: Magnesium: 1.7 mg/dL (ref 1.7–2.4)

## 2022-06-08 SURGERY — RIGHT HEART CATH
Anesthesia: LOCAL

## 2022-06-08 MED ORDER — CALCIUM CARBONATE ANTACID 500 MG PO CHEW
1.0000 | CHEWABLE_TABLET | Freq: Two times a day (BID) | ORAL | Status: DC | PRN
  Administered 2022-06-08 – 2022-06-09 (×2): 200 mg via ORAL
  Filled 2022-06-08 (×2): qty 1

## 2022-06-08 MED ORDER — LIDOCAINE HCL (PF) 1 % IJ SOLN
INTRAMUSCULAR | Status: AC
  Filled 2022-06-08: qty 30

## 2022-06-08 MED ORDER — ONDANSETRON HCL 4 MG/2ML IJ SOLN: 4.0000 mg | Freq: Four times a day (QID) | INTRAMUSCULAR | Status: DC | PRN

## 2022-06-08 MED ORDER — MIDAZOLAM HCL 2 MG/2ML IJ SOLN
INTRAMUSCULAR | Status: AC
  Filled 2022-06-08: qty 2

## 2022-06-08 MED ORDER — HEPARIN (PORCINE) IN NACL 1000-0.9 UT/500ML-% IV SOLN
INTRAVENOUS | Status: DC | PRN
  Administered 2022-06-08: 500 mL

## 2022-06-08 MED ORDER — POTASSIUM CHLORIDE CRYS ER 20 MEQ PO TBCR
40.0000 meq | EXTENDED_RELEASE_TABLET | Freq: Once | ORAL | Status: AC
  Administered 2022-06-08: 40 meq via ORAL
  Filled 2022-06-08: qty 2

## 2022-06-08 MED ORDER — SPIRONOLACTONE 12.5 MG HALF TABLET
12.5000 mg | ORAL_TABLET | Freq: Every day | ORAL | Status: DC
  Administered 2022-06-08 – 2022-06-10 (×3): 12.5 mg via ORAL
  Filled 2022-06-08 (×3): qty 1

## 2022-06-08 MED ORDER — HYDRALAZINE HCL 20 MG/ML IJ SOLN: 10.0000 mg | INTRAMUSCULAR | Status: AC | PRN

## 2022-06-08 MED ORDER — MIDAZOLAM HCL 2 MG/2ML IJ SOLN
INTRAMUSCULAR | Status: DC | PRN
  Administered 2022-06-08: 1 mg via INTRAVENOUS

## 2022-06-08 MED ORDER — LABETALOL HCL 5 MG/ML IV SOLN: 10.0000 mg | INTRAVENOUS | Status: AC | PRN

## 2022-06-08 MED ORDER — SODIUM CHLORIDE 0.9% FLUSH
3.0000 mL | Freq: Two times a day (BID) | INTRAVENOUS | Status: DC
  Administered 2022-06-09 – 2022-06-10 (×2): 3 mL via INTRAVENOUS

## 2022-06-08 MED ORDER — MAGNESIUM SULFATE 4 GM/100ML IV SOLN
4.0000 g | Freq: Once | INTRAVENOUS | Status: AC
  Administered 2022-06-08: 4 g via INTRAVENOUS
  Filled 2022-06-08: qty 100

## 2022-06-08 MED ORDER — HEPARIN (PORCINE) IN NACL 1000-0.9 UT/500ML-% IV SOLN
INTRAVENOUS | Status: AC
  Filled 2022-06-08: qty 500

## 2022-06-08 MED ORDER — ACETAMINOPHEN 325 MG PO TABS: 650.0000 mg | ORAL_TABLET | ORAL | Status: DC | PRN

## 2022-06-08 MED ORDER — SODIUM CHLORIDE 0.9 % IV SOLN: 250.0000 mL | INTRAVENOUS | Status: DC | PRN

## 2022-06-08 MED ORDER — SENNOSIDES-DOCUSATE SODIUM 8.6-50 MG PO TABS
1.0000 | ORAL_TABLET | Freq: Two times a day (BID) | ORAL | Status: DC
  Administered 2022-06-08 – 2022-06-10 (×4): 1 via ORAL
  Filled 2022-06-08 (×4): qty 1

## 2022-06-08 MED ORDER — SODIUM CHLORIDE 0.9% FLUSH: 3.0000 mL | INTRAVENOUS | Status: DC | PRN

## 2022-06-08 SURGICAL SUPPLY — 6 items
CATH SWAN GANZ 7F STRAIGHT (CATHETERS) IMPLANT
KIT MICROPUNCTURE NIT STIFF (SHEATH) IMPLANT
PACK CARDIAC CATHETERIZATION (CUSTOM PROCEDURE TRAY) ×1 IMPLANT
SHEATH PINNACLE 7F 10CM (SHEATH) IMPLANT
SHEATH PROBE COVER 6X72 (BAG) IMPLANT
TRANSDUCER W/STOPCOCK (MISCELLANEOUS) ×1 IMPLANT

## 2022-06-08 NOTE — Progress Notes (Signed)
PROGRESS NOTE    [name] [name]  ZOX:[id number] DOB: April 05, 1946 DOA: 06/06/2022 PCP: Janith Lima, MD  76 yo female h/o severe MR, chronic HFrEF, HTN, permanent atrial fib, CAD s/p CABG x 2 (LIMA to LAD and SVG to OM) in 2016, HLD, IDA, COPD, and PPM, limited GDMT presented to ED w/ dyspnea Chest radiograph with cardiomegaly and bilateral hilar vascular congestion, no effusions. Patient was placed on furosemide and started on milrinone for low output CHF    Subjective: Breathing improving  Assessment and Plan:  Acute on chronic systolic CHF Moderate to Severe MR Low output state Echo EF < 20%, global hypokinesis, moderate LVH, RV systolic function with moderate reduction,. Mild to moderate MR. Moderate TR.  -volume overloaded on admission, elevated lactate, concerning for low output -CHF team following, started on milrinone -CVP is down, continued on BiDil, holding diuretics -CHF team following, plan for right heart cath today -limited GDMT- no SGLT2 w/ Yeast/UTIs and intolerant to entresto    AKI on CKD3a -baseline creat 1.2-1.4 -higher this admission, cardiorenal -Now improving on milrinone   CAD/CANG -stable, continue statin   Persistent atrial fibrillation (HCC) -has dual chamber PPM - continue anticoagulation with rivaroxaban -rate controlled   COPD  -stable   DM 2 -stable CBGs   Thrombocytopenia (HCC) plt at 116 continue close monitoring.    DVT prophylaxis: xarelto Code Status: Full Code Family Communication: none at bedside Disposition Plan: home per Cards   Consultants:  CHF team  Procedures:   Antimicrobials:    Objective: Vitals:   06/08/22 1015 06/08/22 1030 06/08/22 1100 06/08/22 1130  BP: 113/77 (!) 131/101 (!) 153/135 132/71  Pulse: 75 69  95  Resp: (!) 24 14 (!) 23 20  Temp:    98.4 F (36.9 C)  TempSrc:    Oral  SpO2: 97% 98%  98%  Weight:      Height:        Intake/Output Summary (Last 24 hours) at 06/08/2022 1313 Last  data filed at 06/08/2022 1243 Gross per 24 hour  Intake 967.38 ml  Output 3850 ml  Net -2882.62 ml   Filed Weights   06/06/22 2057 06/07/22 0400  Weight: 86.6 kg 86.2 kg    Examination:  Gen: Awake, Alert, Oriented X 3,  HEENT: no JVD Lungs: Good air movement bilaterally, CTAB CVS: S1S2/RRR Abd: soft, Non tender, non distended, BS present Extremities: No edema Skin: no new rashes on exposed skin     Data Reviewed:   CBC: Recent Labs  Lab 06/06/22 0615 06/07/22 0130  WBC 6.5 6.6  HGB 14.2 15.2*  HCT 44.5 47.6*  MCV 91.2 90.2  PLT 108* 811*   Basic Metabolic Panel: Recent Labs  Lab 06/06/22 0615 06/07/22 0130 06/08/22 0255  NA 141 141 140  K 4.3 3.9 3.6  CL 115* 110 107  CO2 17* 23 24  GLUCOSE 105* 136* 115*  BUN 28* 36* 29*  CREATININE 1.62* 1.73* 1.36*  CALCIUM 9.1 9.3 9.0  MG  --   --  1.7   GFR: Estimated Creatinine Clearance: 39 mL/min (A) (by C-G formula based on SCr of 1.36 mg/dL (H)). Liver Function Tests: No results for input(s): "AST", "ALT", "ALKPHOS", "BILITOT", "PROT", "ALBUMIN" in the last 168 hours. No results for input(s): "LIPASE", "AMYLASE" in the last 168 hours. No results for input(s): "AMMONIA" in the last 168 hours. Coagulation Profile: No results for input(s): "INR", "PROTIME" in the last 168 hours. Cardiac Enzymes: No results for input(s): "  CKTOTAL", "CKMB", "CKMBINDEX", "TROPONINI" in the last 168 hours. BNP (last 3 results) No results for input(s): "PROBNP" in the last 8760 hours. HbA1C: No results for input(s): "HGBA1C" in the last 72 hours. CBG: No results for input(s): "GLUCAP" in the last 168 hours. Lipid Profile: No results for input(s): "CHOL", "HDL", "LDLCALC", "TRIG", "CHOLHDL", "LDLDIRECT" in the last 72 hours. Thyroid Function Tests: No results for input(s): "TSH", "T4TOTAL", "FREET4", "T3FREE", "THYROIDAB" in the last 72 hours. Anemia Panel: No results for input(s): "VITAMINB12", "FOLATE", "FERRITIN", "TIBC",  "IRON", "RETICCTPCT" in the last 72 hours. Urine analysis:    Component Value Date/Time   COLORURINE AMBER (A) 06/06/2022 0830   APPEARANCEUR CLEAR 06/06/2022 0830   LABSPEC >1.030 (H) 06/06/2022 0830   PHURINE 5.5 06/06/2022 0830   GLUCOSEU NEGATIVE 06/06/2022 0830   GLUCOSEU NEGATIVE 03/16/2022 1343   HGBUR TRACE (A) 06/06/2022 0830   BILIRUBINUR NEGATIVE 06/06/2022 0830   BILIRUBINUR ng 12/25/2013 1342   KETONESUR NEGATIVE 06/06/2022 0830   PROTEINUR 100 (A) 06/06/2022 0830   UROBILINOGEN 1.0 03/16/2022 1343   NITRITE NEGATIVE 06/06/2022 0830   LEUKOCYTESUR NEGATIVE 06/06/2022 0830   Sepsis Labs: '@LABRCNTIP'$ (procalcitonin:4,lacticidven:4)  )No results found for this or any previous visit (from the past 240 hour(s)).   Radiology Studies: CARDIAC CATHETERIZATION  Result Date: 06/08/2022 Findings: On milrinone 0.25 RA = 1 RV = 27/1 PA = 32/13 (18) PCW = 2 Fick cardiac output/index = 5.3/2.7 Thermo = 3.8/1.9 PVR = 3.0 (fick) 4.2 (TD) Ao sat = 93% PA sat = 71%, 68% Given 175 cc NS load PCW = 5 TD CO/CI = 4.1/2.1 Assessment: 1. Low volume status 2. With mild to moderately depressed CO on milrinone 3. Mild improvement in hemodynamics with volume loading Plan/Discussion: Hold diuretics. Wean milrinone starting tomorrow. [name], MD 9:12 AM  DG Chest Port 1 View  Result Date: 06/07/2022 CLINICAL DATA:  Status post peripherally inserted central catheter (PICC) central line placement EXAM: PORTABLE CHEST 1 VIEW COMPARISON:  Chest x-ray June 06, 2022. FINDINGS: Similar enlarged cardiac silhouette. Median sternotomy. Left atrial appendage clip. Left subclavian approach cardiac rhythm maintenance device.Right PICC with tip projecting at the lower SVC. Mild interstitial coarsening without Kerley B lines or effusions. No consolidation. IMPRESSION: Right PICC with tip projecting at the lower SVC. Electronically Signed   By: Margaretha Sheffield M.D.   On: 06/07/2022 11:45   [place] EKG SITE  RITE  Result Date: 06/07/2022 If Site Rite image not attached, placement could not be confirmed due to current cardiac rhythm.  ECHOCARDIOGRAM COMPLETE  Result Date: 06/06/2022    ECHOCARDIOGRAM REPORT   Patient Name:   [name] [name] Date of Exam: 06/06/2022 Medical Rec #:  [id number]     Height:       67.5 in Accession #:    [id number]    Weight:       188.0 lb Date of Birth:  [birth year]     BSA:          1.981 m Patient Age:    80 years      BP:           152/90 mmHg Patient Gender: F             HR:           75 bpm. Exam Location:  Inpatient Procedure: 2D Echo, Cardiac Doppler and Color Doppler Indications:    Congenital Heart Disease Q24.0  History:        Patient has prior history  of Echocardiogram examinations.                 Cardiomyopathy, CAD, Prior CABG and Pacemaker, COPD,                 Arrythmias:Atrial Fibrillation; Risk Factors:Hypertension.  Sonographer:    Eartha Inch Referring Phys: [id number] AMY D CLEGG  Sonographer Comments: Image acquisition challenging due to patient body habitus and Image acquisition challenging due to respiratory motion. IMPRESSIONS  1. Left ventricular ejection fraction, by estimation, is 20%. The left ventricle has severely decreased function. The left ventricle demonstrates global hypokinesis. There is moderate concentric left ventricular hypertrophy. Left ventricular diastolic parameters are indeterminate.  2. Right ventricular systolic function is moderately reduced. The right ventricular size is normal. There is moderately elevated pulmonary artery systolic pressure. The estimated right ventricular systolic pressure is 93.7 mmHg.  3. Left atrial size was moderately dilated.  4. Right atrial size was moderately dilated.  5. The mitral valve is abnormal. Mild to moderate mitral valve regurgitation. No evidence of mitral stenosis.  6. The tricuspid valve is abnormal. Tricuspid valve regurgitation is moderate.  7. The aortic valve is tricuspid. There is mild  calcification of the aortic valve. Aortic valve regurgitation is trivial. No aortic stenosis is present.  8. The inferior vena cava is dilated in size with <50% respiratory variability, suggesting right atrial pressure of 15 mmHg.  9. The patient is in atrial fibrillation. FINDINGS  Left Ventricle: Left ventricular ejection fraction, by estimation, is 20%. The left ventricle has severely decreased function. The left ventricle demonstrates global hypokinesis. The left ventricular internal cavity size was normal in size. There is moderate concentric left ventricular hypertrophy. Left ventricular diastolic parameters are indeterminate. Right Ventricle: The right ventricular size is normal. No increase in right ventricular wall thickness. Right ventricular systolic function is moderately reduced. There is moderately elevated pulmonary artery systolic pressure. The tricuspid regurgitant velocity is 2.79 m/s, and with an assumed right atrial pressure of 15 mmHg, the estimated right ventricular systolic pressure is 90.2 mmHg. Left Atrium: Left atrial size was moderately dilated. Right Atrium: Right atrial size was moderately dilated. Pericardium: There is no evidence of pericardial effusion. Mitral Valve: The mitral valve is abnormal. There is mild calcification of the mitral valve leaflet(s). Mild mitral annular calcification. Mild to moderate mitral valve regurgitation. No evidence of mitral valve stenosis. Tricuspid Valve: The tricuspid valve is abnormal. Tricuspid valve regurgitation is moderate. Aortic Valve: The aortic valve is tricuspid. There is mild calcification of the aortic valve. Aortic valve regurgitation is trivial. No aortic stenosis is present. Pulmonic Valve: The pulmonic valve was normal in structure. Pulmonic valve regurgitation is not visualized. Aorta: The aortic root is normal in size and structure. Venous: The inferior vena cava is dilated in size with less than 50% respiratory variability,  suggesting right atrial pressure of 15 mmHg. IAS/Shunts: No atrial level shunt detected by color flow Doppler. Additional Comments: A device lead is visualized in the right ventricle.  LEFT VENTRICLE PLAX 2D LVIDd:         5.70 cm      Diastology LVIDs:         4.60 cm      LV e' medial:    4.28 cm/s LV PW:         0.90 cm      LV E/e' medial:  20.4 LV IVS:        0.90 cm      LV e'  lateral:   11.50 cm/s LVOT diam:     2.00 cm      LV E/e' lateral: 7.6 LV SV:         41 LV SV Index:   21 LVOT Area:     3.14 cm  LV Volumes (MOD) LV vol d, MOD A2C: 139.0 ml LV vol d, MOD A4C: 128.0 ml LV vol s, MOD A2C: 119.0 ml LV vol s, MOD A4C: 92.3 ml LV SV MOD A2C:     20.0 ml LV SV MOD A4C:     128.0 ml LV SV MOD BP:      31.4 ml RIGHT VENTRICLE            IVC RV S prime:     7.54 cm/s  IVC diam: 2.20 cm TAPSE (M-mode): 1.5 cm LEFT ATRIUM           Index LA diam:      5.30 cm 2.68 cm/m LA Vol (A4C): 84.2 ml 42.51 ml/m  AORTIC VALVE LVOT Vmax:   109.00 cm/s LVOT Vmean:  68.300 cm/s LVOT VTI:    0.132 m  AORTA Ao Root diam: 2.50 cm MITRAL VALVE               TRICUSPID VALVE MV Area (PHT): 4.12 cm    TR Peak grad:   31.1 mmHg MV Decel Time: 184 msec    TR Mean grad:   21.0 mmHg MR Peak grad: 111.1 mmHg   TR Vmax:        279.00 cm/s MR Mean grad: 75.0 mmHg    TR Vmean:       222.0 cm/s MR Vmax:      527.00 cm/s MR Vmean:     414.0 cm/s   SHUNTS MV E velocity: 87.50 cm/s  Systemic VTI:  0.13 m                            Systemic Diam: 2.00 cm Dalton McleanMD Electronically signed by Franki Monte Signature Date/Time: 06/06/2022/3:51:09 PM    Final      Scheduled Meds:  Chlorhexidine Gluconate Cloth  6 each Topical Daily   fluticasone furoate-vilanterol  1 puff Inhalation Daily   And   umeclidinium bromide  1 puff Inhalation Daily   isosorbide-hydrALAZINE  1 tablet Oral TID   rivaroxaban  20 mg Oral Q supper   sodium chloride flush  10-40 mL Intracatheter Q12H   sodium chloride flush  3 mL Intravenous Q12H   sodium  chloride flush  3 mL Intravenous Q12H   spironolactone  12.5 mg Oral Daily   Continuous Infusions:  sodium chloride     magnesium sulfate bolus IVPB 4 g (06/08/22 1152)   milrinone 0.25 mcg/kg/min (06/07/22 1545)     LOS: 2 days    Time spent: 65mn    PDomenic Polite MD [facility]   06/08/2022, 1:13 PM

## 2022-06-08 NOTE — Progress Notes (Addendum)
Advanced Heart Failure Rounding Note  PCP-Cardiologist: [name], MD   Subjective:    Initially refused CO-OX and RHC this am.   Diuresed well with IV lasix. Declined weight this am. Scr improving, 1.7>1.36. CVP 5.  Dyspnea improving. Reports gas.      Objective:   Weight Range: 86.2 kg Body mass index is 29.76 kg/m.   Vital Signs:   Temp:  [97.3 F (36.3 C)-98.6 F (37 C)] 98.6 F (37 C) (10/25 0507) Pulse Rate:  [67-89] 89 (10/25 0507) Resp:  [19-24] 21 (10/25 0507) BP: (114-136)/(70-88) 130/77 (10/25 0507) SpO2:  [92 %-97 %] 92 % (10/25 0507) Last BM Date : 06/06/22  Weight change: Filed Weights   06/06/22 2057 06/07/22 0400  Weight: 86.6 kg 86.2 kg    Intake/Output:   Intake/Output Summary (Last 24 hours) at 06/08/2022 0720 Last data filed at 06/08/2022 0305 Gross per 24 hour  Intake 858.03 ml  Output 5170 ml  Net -4311.97 ml      Physical Exam    General:  Lying comfortably in bed. No distress. HEENT: normal Neck: supple. no JVD. Carotids 2+ bilat; no bruits.  Cor: PMI nondisplaced. Irregular rhythm, rate 80s-90s. No rubs, gallops or murmurs. Lungs: clear Abdomen: soft, nontender, nondistended.  Extremities: no cyanosis, clubbing, rash, edema Neuro: alert & orientedx3, cranial nerves grossly intact. moves all 4 extremities w/o difficulty. Affect pleasant    Telemetry   Afib 80s-90s, ~ 15 PVCs/min, V paced (personally reviewed)  Labs    CBC Recent Labs    06/06/22 0615 06/07/22 0130  WBC 6.5 6.6  HGB 14.2 15.2*  HCT 44.5 47.6*  MCV 91.2 90.2  PLT 108* 562*   Basic Metabolic Panel Recent Labs    06/07/22 0130 06/08/22 0255  NA 141 140  K 3.9 3.6  CL 110 107  CO2 23 24  GLUCOSE 136* 115*  BUN 36* 29*  CREATININE 1.73* 1.36*  CALCIUM 9.3 9.0   Liver Function Tests No results for input(s): "AST", "ALT", "ALKPHOS", "BILITOT", "PROT", "ALBUMIN" in the last 72 hours. No results for input(s): "LIPASE", "AMYLASE" in  the last 72 hours. Cardiac Enzymes No results for input(s): "CKTOTAL", "CKMB", "CKMBINDEX", "TROPONINI" in the last 72 hours.  BNP: BNP (last 3 results) Recent Labs    04/28/22 1203 05/03/22 1003 06/06/22 0615  BNP 1,166.4* 684.0* 1,732.7*    ProBNP (last 3 results) No results for input(s): "PROBNP" in the last 8760 hours.   D-Dimer No results for input(s): "DDIMER" in the last 72 hours. Hemoglobin A1C No results for input(s): "HGBA1C" in the last 72 hours. Fasting Lipid Panel No results for input(s): "CHOL", "HDL", "LDLCALC", "TRIG", "CHOLHDL", "LDLDIRECT" in the last 72 hours. Thyroid Function Tests No results for input(s): "TSH", "T4TOTAL", "T3FREE", "THYROIDAB" in the last 72 hours.  Invalid input(s): "FREET3"  Other results:   Imaging    DG Chest Port 1 View  Result Date: 06/07/2022 CLINICAL DATA:  Status post peripherally inserted central catheter (PICC) central line placement EXAM: PORTABLE CHEST 1 VIEW COMPARISON:  Chest x-ray June 06, 2022. FINDINGS: Similar enlarged cardiac silhouette. Median sternotomy. Left atrial appendage clip. Left subclavian approach cardiac rhythm maintenance device.Right PICC with tip projecting at the lower SVC. Mild interstitial coarsening without Kerley B lines or effusions. No consolidation. IMPRESSION: Right PICC with tip projecting at the lower SVC. Electronically Signed   By: Margaretha Sheffield M.D.   On: 06/07/2022 11:45   [place] EKG SITE RITE  Result Date: 06/07/2022  If [employer] not attached, placement could not be confirmed due to current cardiac rhythm.    Medications:     Scheduled Medications:  Chlorhexidine Gluconate Cloth  6 each Topical Daily   fluticasone furoate-vilanterol  1 puff Inhalation Daily   And   umeclidinium bromide  1 puff Inhalation Daily   isosorbide-hydrALAZINE  1 tablet Oral TID   rivaroxaban  20 mg Oral Q supper   sodium chloride flush  10-40 mL Intracatheter Q12H   sodium chloride flush   3 mL Intravenous Q12H   sodium chloride flush  3 mL Intravenous Q12H    Infusions:  sodium chloride     sodium chloride     milrinone 0.25 mcg/kg/min (06/07/22 1545)    PRN Medications: sodium chloride, acetaminophen **OR** acetaminophen, albuterol, naphazoline-glycerin, ondansetron **OR** ondansetron (ZOFRAN) IV, oxyCODONE, sodium chloride flush, sodium chloride flush    Patient Profile   [name] [name] is a 76 year old with a h/o severe MR, chronic HFrEF, HTN, permanent atrial fib, CAD s/p CABG x 2 (LIMA to LAD and SVG to OM) in 2016, HLD, IDA, COPD, and Biotronik PPM. GDMT limited - taken off SGLT2i due to UTI/yeast and intolerant entresto due to chest pain.    Presented with increased shortness of breath and chest pain. Out torsemide for some time. Admitted with A/C HFrEF possible low output HF.   Assessment/Plan   1. A/C HFrEF,  - Suspect Combined ICM /NICM with previous A fib RVR. RV Pacing? - In 2021 EF improved 55-60% but over the last year Ef has been down 25-30 with severe MR/TR - Echo this admit EF 20%, moderate LVH, RV moderately reduced, RVSP 46 mmHg, moderate BAE, mild to moderate MR, moderate TR, dilated IVC NYHA IV. BNP 1732. CXR with vascular congestion.  Prior to admit, she was off torsemide. Refer back to HF Paramedicine.  -Marked volume overload on admit.  Concerned about possible low-output. Lactic acid up to 2.8, now cleared.  - Initial CO-OX 63% after starting milrinone 0.25. Check CO-OX this am, now agrees. - Start milrinone 0.25 mcg/kg/min - CVP 5. Stop IV lasix for now, decide on further diuresis pending RHC. RHC today - now agrees to sign consent. -BP improving with Bidil, continue 1 tab TID -No SGLT2i due UTI/yeast infections.  -Failed entresto in the past due to chest pain.  -Add spiro 12.5 mg daily   2. AKI  -Creatinine baseline  1.2-1.4 -Scr this admit 1.6>1.73>1.36 -Improving with inotrope support and diuresis   3. MR, Previously Severe - Mild to  moderate on echo this admit   4. Permanent A fib  - Biotronik dual chamber PPM/AV nodal ablation 2022.  - Followed by EP for possible upgrade to ICD but she has not wanted to pursue.  -Rate controlled.  -Continue xarelto    5. CAD -H/O CABG x2 2016(LIMA to LAD and SVG to OM)  -HS Trop, 40>28 -No chest pain.  -Continue statin   6. COPD  7. PVCs -~15/min on tele -K 3.6, supp. Check mag. -? Addition of amiodarone     Refer back to HF Paramedicine for assistance with medications. She is at high risk for readmission.   Length of Stay: 2  FINCH, LINDSAY N, PA-C  06/08/2022, 7:20 AM  Advanced Heart Failure Team Pager [phone] (M-F; 7a - 5p)  Please contact [facility] for night-coverage after hours (5p -7a ) and weekends on [email or web address]  Patient seen and examined with the above-signed Advanced Practice Provider and/or  Housestaff. I personally reviewed laboratory data, imaging studies and relevant notes. I independently examined the patient and formulated the important aspects of the plan. I have edited the note to reflect any of my changes or salient points. I have personally discussed the plan with the patient and/or family.  Feeling better. Denies SOB, orthopnea or PND.   RHC this am   On milrinone 0.25    RA = 1 RV = 27/1 PA = 32/13 (18) PCW = 2 Fick cardiac output/index = 5.3/2.7 Thermo = 3.8/1.9 PVR = 3.0 (fick) 4.2 (TD) Ao sat = 93% PA sat = 71%, 68%   Given 175 cc NS load   PCW = 5 TD CO/CI = 4.1/2.1   Assessment: 1. Low volume status 2. With mild to moderately depressed CO on milrinone 3. Mild improvement in hemodynamics with volume loading   General:  Lying in bed. No resp difficulty HEENT: normal Neck: supple. no JVD. Carotids 2+ bilat; no bruits. No lymphadenopathy or thryomegaly appreciated. Cor: PMI nondisplaced. Regular rate & rhythm. No rubs, gallops or murmurs. Lungs: clear Abdomen: soft, nontender, nondistended. No hepatosplenomegaly. No  bruits or masses. Good bowel sounds. Extremities: no cyanosis, clubbing, rash, edema Neuro: alert & orientedx3, cranial nerves grossly intact. moves all 4 extremities w/o difficulty. Affect pleasant  Hemodynamics much better than expected. Volume status low. Hold diuretics. Start milrinone wean tomorrow. Need to consider CRT upgrade due to RV pacing but she has been unwilling to consider.   Continue Xarelto for AF.   [name], MD  5:54 PM

## 2022-06-08 NOTE — Progress Notes (Signed)
Patient refused AM Cooxemetry  panel and AM weight. Would like to talk to MD about possible cardiac cath this AM before anything else is done. Patient states that she does not want to do cath and appears to not understand what a cardiac cath is. Patient has been NPO this night except for sips with tylenol.

## 2022-06-08 NOTE — Interval H&P Note (Signed)
History and Physical Interval Note:  06/08/2022 8:45 AM  [name] [name]  has presented today for surgery, with the diagnosis of acute on chronic systolic heart failure.  The various methods of treatment have been discussed with the patient and family. After consideration of risks, benefits and other options for treatment, the patient has consented to  Procedure(s): RIGHT HEART CATH (N/A) as a surgical intervention.  [name] history has been reviewed, patient examined, no change in status, stable for surgery.  I have reviewed the patient's chart and labs.  Questions were answered to the patient's satisfaction.     Schae Cando

## 2022-06-08 NOTE — Telephone Encounter (Signed)
Pharmacy Patient Advocate Encounter  Insurance verification completed.    The patient is insured through [employer] Part D   The patient is currently admitted and ran test claims for the following: isosorbide-hydralazine (Bidil) .  Copays and coinsurance results were relayed to Inpatient clinical team.

## 2022-06-08 NOTE — Progress Notes (Signed)
Co-ox sent; consent for heart catheterization completed. Ordered NS infusion started.

## 2022-06-08 NOTE — Progress Notes (Signed)
Lab will add-on magnesium per order.

## 2022-06-08 NOTE — TOC Benefit Eligibility Note (Signed)
Patient Teacher, English as a foreign language completed.    The patient is currently admitted and upon discharge could be taking isosorbide-hydralazine (Bidil) 20-37.5 mg.  The current 30 day co-pay is $52.57.   The patient is insured through [place], [place] Patient Advocate Specialist [place] Patient Advocate Team Direct Number: [phone]  Fax: [phone]

## 2022-06-09 DIAGNOSIS — I5023 Acute on chronic systolic (congestive) heart failure: Secondary | ICD-10-CM | POA: Diagnosis not present

## 2022-06-09 LAB — COOXEMETRY PANEL
Carboxyhemoglobin: 1.9 % — ABNORMAL HIGH (ref 0.5–1.5)
Carboxyhemoglobin: 1.6 % — ABNORMAL HIGH (ref 0.5–1.5)
Methemoglobin: <0.7 % (ref 0.0–1.5)
Methemoglobin: <0.7 % (ref 0.0–1.5)
O2 Saturation: 63.5 %
O2 Saturation: 63.3 %
Total hemoglobin: 15.2 g/dL (ref 12.0–16.0)
Total hemoglobin: 15.8 g/dL (ref 12.0–16.0)

## 2022-06-09 LAB — BASIC METABOLIC PANEL
Anion gap: 9 (ref 5–15)
BUN: 19 mg/dL (ref 8–23)
CO2: 22 mmol/L (ref 22–32)
Calcium: 8.5 mg/dL — ABNORMAL LOW (ref 8.9–10.3)
Chloride: 107 mmol/L (ref 98–111)
Creatinine, Ser: 1.09 mg/dL — ABNORMAL HIGH (ref 0.44–1.00)
GFR, Estimated: 52 mL/min — ABNORMAL LOW (ref >60)
Glucose, Bld: 107 mg/dL — ABNORMAL HIGH (ref 70–99)
Potassium: 3.7 mmol/L (ref 3.5–5.1)
Sodium: 138 mmol/L (ref 135–145)

## 2022-06-09 LAB — GLUCOSE, CAPILLARY: Glucose-Capillary: 126 mg/dL — ABNORMAL HIGH (ref 70–99)

## 2022-06-09 LAB — MAGNESIUM: Magnesium: 3.3 mg/dL — ABNORMAL HIGH (ref 1.7–2.4)

## 2022-06-09 MED ORDER — POTASSIUM CHLORIDE CRYS ER 20 MEQ PO TBCR
40.0000 meq | EXTENDED_RELEASE_TABLET | Freq: Once | ORAL | Status: AC
  Administered 2022-06-09: 40 meq via ORAL
  Filled 2022-06-09: qty 2

## 2022-06-09 MED ORDER — ISOSORB DINITRATE-HYDRALAZINE 20-37.5 MG PO TABS
2.0000 | ORAL_TABLET | Freq: Three times a day (TID) | ORAL | Status: DC
  Administered 2022-06-09 – 2022-06-10 (×3): 2 via ORAL
  Filled 2022-06-09 (×3): qty 2

## 2022-06-09 NOTE — Progress Notes (Signed)
Mobility Specialist Progress Note:   06/09/22 1220  Mobility  Activity Ambulated with assistance in hallway  Level of Assistance Standby assist, set-up cues, supervision of patient - no hands on  Assistive Device None  Distance Ambulated (ft) 200 ft  Activity Response Tolerated well  $Mobility charge 1 Mobility   Pt received EOB willing to participate in mobility. No complaints of pain. Required 1 standing rest break. Left EOB  with call bell in reach and all needs met.   [facility] Surveyor, mining Chat only

## 2022-06-09 NOTE — Progress Notes (Signed)
PROGRESS NOTE    [name] [name]  [name] [name]:[id number] DOB: 1946/02/08 DOA: 06/06/2022 PCP: Janith Lima, MD  76 yo female h/o severe MR, chronic HFrEF, HTN, permanent atrial fib, CAD s/p CABG x 2 (LIMA to LAD and SVG to OM) in 2016, HLD, IDA, COPD, and PPM, limited GDMT presented to ED w/ dyspnea Chest radiograph with cardiomegaly and bilateral hilar vascular congestion, no effusions. Patient was placed on furosemide and started on milrinone for low output CHF    Subjective: Breathing improving  Assessment and Plan:  Acute on chronic systolic CHF Moderate to Severe MR Low output state Echo EF < 20%, global hypokinesis, moderate LVH, RV systolic function with moderate reduction,. Mild to moderate MR. Moderate TR.  -volume overloaded on admission, elevated lactate, concerning for low output -CHF team following, started on milrinone -CVP is down, continued on BiDil, holding diuretics -CHF team following, right heart cath yesterday with low filling pressures, milrinone being weaned -limited GDMT- no SGLT2 w/ Yeast/UTIs and intolerant to entresto -Anticipate discharge in 1 to 2 days    AKI on CKD3a -baseline creat 1.2-1.4 -higher this admission, cardiorenal -Now improving on milrinone   CAD/CANG -stable, continue statin   Persistent atrial fibrillation (HCC) -has dual chamber PPM - continue anticoagulation with rivaroxaban -rate controlled   COPD  -stable   DM 2 -stable CBGs   Thrombocytopenia (HCC) -Mild, monitor on Xarelto   DVT prophylaxis: xarelto Code Status: Full Code Family Communication: none at bedside Disposition Plan: home per Cards   Consultants:  CHF team  Procedures:   Antimicrobials:    Objective: Vitals:   06/09/22 0006 06/09/22 0359 06/09/22 0450 06/09/22 0747  BP: (!) 147/90  (!) 164/97 (!) 134/92  Pulse: 85 80  85  Resp: '20 18 17 20  '$ Temp: 98.5 F (36.9 C) 98.6 F (37 C) 98.3 F (36.8 C) 97.9 F (36.6 C)  TempSrc: Oral Oral Oral Oral   SpO2: 93% 95% 96% 95%  Weight: 83 kg     Height:        Intake/Output Summary (Last 24 hours) at 06/09/2022 0937 Last data filed at 06/09/2022 0618 Gross per 24 hour  Intake 960.69 ml  Output 1050 ml  Net -89.31 ml   Filed Weights   06/06/22 2057 06/07/22 0400 06/09/22 0006  Weight: 86.6 kg 86.2 kg 83 kg    Examination:  Pleasant female sitting up in bed, AAOx3, no distress HEENT: No JVD CVS: S1-S2, regular rhythm Lungs: Clear bilaterally, diminished in the bases Abdomen: Soft, nontender, bowel sounds present Extremities: No edema  Skin: no new rashes on exposed skin     Data Reviewed:   CBC: Recent Labs  Lab 06/06/22 0615 06/07/22 0130 06/08/22 0858 06/08/22 0859  WBC 6.5 6.6  --   --   HGB 14.2 15.2* 16.0* 14.6  HCT 44.5 47.6* 47.0* 43.0  MCV 91.2 90.2  --   --   PLT 108* 116*  --   --    Basic Metabolic Panel: Recent Labs  Lab 06/06/22 0615 06/07/22 0130 06/08/22 0255 06/08/22 0858 06/08/22 0859 06/09/22 0500  NA 141 141 140 144 147* 138  K 4.3 3.9 3.6 3.3* 2.8* 3.7  CL 115* 110 107  --   --  107  CO2 17* 23 24  --   --  22  GLUCOSE 105* 136* 115*  --   --  107*  BUN 28* 36* 29*  --   --  19  CREATININE 1.62* 1.73*  1.36*  --   --  1.09*  CALCIUM 9.1 9.3 9.0  --   --  8.5*  MG  --   --  1.7  --   --  3.3*   GFR: Estimated Creatinine Clearance: 47.9 mL/min (A) (by C-G formula based on SCr of 1.09 mg/dL (H)). Liver Function Tests: No results for input(s): "AST", "ALT", "ALKPHOS", "BILITOT", "PROT", "ALBUMIN" in the last 168 hours. No results for input(s): "LIPASE", "AMYLASE" in the last 168 hours. No results for input(s): "AMMONIA" in the last 168 hours. Coagulation Profile: No results for input(s): "INR", "PROTIME" in the last 168 hours. Cardiac Enzymes: No results for input(s): "CKTOTAL", "CKMB", "CKMBINDEX", "TROPONINI" in the last 168 hours. BNP (last 3 results) No results for input(s): "PROBNP" in the last 8760 hours. HbA1C: No  results for input(s): "HGBA1C" in the last 72 hours. CBG: Recent Labs  Lab 06/09/22 0628  GLUCAP 126*   Lipid Profile: No results for input(s): "CHOL", "HDL", "LDLCALC", "TRIG", "CHOLHDL", "LDLDIRECT" in the last 72 hours. Thyroid Function Tests: No results for input(s): "TSH", "T4TOTAL", "FREET4", "T3FREE", "THYROIDAB" in the last 72 hours. Anemia Panel: No results for input(s): "VITAMINB12", "FOLATE", "FERRITIN", "TIBC", "IRON", "RETICCTPCT" in the last 72 hours. Urine analysis:    Component Value Date/Time   COLORURINE AMBER (A) 06/06/2022 0830   APPEARANCEUR CLEAR 06/06/2022 0830   LABSPEC >1.030 (H) 06/06/2022 0830   PHURINE 5.5 06/06/2022 0830   GLUCOSEU NEGATIVE 06/06/2022 0830   GLUCOSEU NEGATIVE 03/16/2022 1343   HGBUR TRACE (A) 06/06/2022 0830   BILIRUBINUR NEGATIVE 06/06/2022 0830   BILIRUBINUR ng 12/25/2013 1342   KETONESUR NEGATIVE 06/06/2022 0830   PROTEINUR 100 (A) 06/06/2022 0830   UROBILINOGEN 1.0 03/16/2022 1343   NITRITE NEGATIVE 06/06/2022 0830   LEUKOCYTESUR NEGATIVE 06/06/2022 0830   Sepsis Labs: '@LABRCNTIP'$ (procalcitonin:4,lacticidven:4)  )No results found for this or any previous visit (from the past 240 hour(s)).   Radiology Studies: CARDIAC CATHETERIZATION  Result Date: 06/08/2022 Findings: On milrinone 0.25 RA = 1 RV = 27/1 PA = 32/13 (18) PCW = 2 Fick cardiac output/index = 5.3/2.7 Thermo = 3.8/1.9 PVR = 3.0 (fick) 4.2 (TD) Ao sat = 93% PA sat = 71%, 68% Given 175 cc NS load PCW = 5 TD CO/CI = 4.1/2.1 Assessment: 1. Low volume status 2. With mild to moderately depressed CO on milrinone 3. Mild improvement in hemodynamics with volume loading Plan/Discussion: Hold diuretics. Wean milrinone starting tomorrow. [name], MD 9:12 AM  DG Chest Port 1 View  Result Date: 06/07/2022 CLINICAL DATA:  Status post peripherally inserted central catheter (PICC) central line placement EXAM: PORTABLE CHEST 1 VIEW COMPARISON:  Chest x-ray June 06, 2022.  FINDINGS: Similar enlarged cardiac silhouette. Median sternotomy. Left atrial appendage clip. Left subclavian approach cardiac rhythm maintenance device.Right PICC with tip projecting at the lower SVC. Mild interstitial coarsening without Kerley B lines or effusions. No consolidation. IMPRESSION: Right PICC with tip projecting at the lower SVC. Electronically Signed   By: Margaretha Sheffield M.D.   On: 06/07/2022 11:45   [place] EKG SITE RITE  Result Date: 06/07/2022 If Site Rite image not attached, placement could not be confirmed due to current cardiac rhythm.    Scheduled Meds:  Chlorhexidine Gluconate Cloth  6 each Topical Daily   fluticasone furoate-vilanterol  1 puff Inhalation Daily   And   umeclidinium bromide  1 puff Inhalation Daily   isosorbide-hydrALAZINE  2 tablet Oral TID   rivaroxaban  20 mg Oral Q supper  senna-docusate  1 tablet Oral BID   sodium chloride flush  10-40 mL Intracatheter Q12H   sodium chloride flush  3 mL Intravenous Q12H   sodium chloride flush  3 mL Intravenous Q12H   spironolactone  12.5 mg Oral Daily   Continuous Infusions:  sodium chloride     milrinone 0.125 mcg/kg/min (06/09/22 0916)     LOS: 3 days    Time spent: 85mn    PDomenic Polite MD [facility]   06/09/2022, 9:37 AM
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
## 2022-06-09 NOTE — Progress Notes (Addendum)
Advanced Heart Failure Rounding Note  PCP-Cardiologist: [name], MD   Subjective:    RHC 10/25: Low volume status and CI (thermo) 1.9 on milrinone. CI improved with giving back volume.    CO-OX 64% on milrinone 0.25.  Diuretics on hold. Scr improved.  CVP 2.   Feeling well. Hospital bed uncomfortable otherwise no complaints.   Objective:   Weight Range: 83 kg Body mass index is 28.66 kg/m.   Vital Signs:   Temp:  [97.6 F (36.4 C)-98.6 F (37 C)] 97.9 F (36.6 C) (10/26 0747) Pulse Rate:  [56-95] 85 (10/26 0747) Resp:  [12-24] 20 (10/26 0747) BP: (113-164)/(58-135) 134/92 (10/26 0747) SpO2:  [93 %-100 %] 95 % (10/26 0747) Weight:  [83 kg] 83 kg (10/26 0006) Last BM Date : 06/06/22  Weight change: Filed Weights   06/06/22 2057 06/07/22 0400 06/09/22 0006  Weight: 86.6 kg 86.2 kg 83 kg    Intake/Output:   Intake/Output Summary (Last 24 hours) at 06/09/2022 0830 Last data filed at 06/09/2022 0618 Gross per 24 hour  Intake 960.69 ml  Output 1050 ml  Net -89.31 ml      Physical Exam    General:  Well appearing.  HEENT: normal Neck: supple. no JVD. Carotids 2+ bilat; no bruits.  Cor: PMI nondisplaced. Irregular rhythm, rate 80s-90s. No rubs, gallops or murmurs. Lungs: clear Abdomen: soft, nontender, nondistended.  Extremities: no cyanosis, clubbing, rash, edema Neuro: alert & orientedx3, cranial nerves grossly intact. moves all 4 extremities w/o difficulty. Affect pleasant   Telemetry   Afib 80s-90s, ~ 10 PVCs/min, V paced (personally reviewed)  Labs    CBC Recent Labs    06/07/22 0130 06/08/22 0858 06/08/22 0859  WBC 6.6  --   --   HGB 15.2* 16.0* 14.6  HCT 47.6* 47.0* 43.0  MCV 90.2  --   --   PLT 116*  --   --    Basic Metabolic Panel Recent Labs    06/08/22 0255 06/08/22 0858 06/08/22 0859 06/09/22 0500  NA 140   < > 147* 138  K 3.6   < > 2.8* 3.7  CL 107  --   --  107  CO2 24  --   --  22  GLUCOSE 115*  --   --   107*  BUN 29*  --   --  19  CREATININE 1.36*  --   --  1.09*  CALCIUM 9.0  --   --  8.5*  MG 1.7  --   --  3.3*   < > = values in this interval not displayed.   Liver Function Tests No results for input(s): "AST", "ALT", "ALKPHOS", "BILITOT", "PROT", "ALBUMIN" in the last 72 hours. No results for input(s): "LIPASE", "AMYLASE" in the last 72 hours. Cardiac Enzymes No results for input(s): "CKTOTAL", "CKMB", "CKMBINDEX", "TROPONINI" in the last 72 hours.  BNP: BNP (last 3 results) Recent Labs    04/28/22 1203 05/03/22 1003 06/06/22 0615  BNP 1,166.4* 684.0* 1,732.7*    ProBNP (last 3 results) No results for input(s): "PROBNP" in the last 8760 hours.   D-Dimer No results for input(s): "DDIMER" in the last 72 hours. Hemoglobin A1C No results for input(s): "HGBA1C" in the last 72 hours. Fasting Lipid Panel No results for input(s): "CHOL", "HDL", "LDLCALC", "TRIG", "CHOLHDL", "LDLDIRECT" in the last 72 hours. Thyroid Function Tests No results for input(s): "TSH", "T4TOTAL", "T3FREE", "THYROIDAB" in the last 72 hours.  Invalid input(s): "FREET3"  Other results:  Imaging    CARDIAC CATHETERIZATION  Result Date: 06/08/2022 Findings: On milrinone 0.25 RA = 1 RV = 27/1 PA = 32/13 (18) PCW = 2 Fick cardiac output/index = 5.3/2.7 Thermo = 3.8/1.9 PVR = 3.0 (fick) 4.2 (TD) Ao sat = 93% PA sat = 71%, 68% Given 175 cc NS load PCW = 5 TD CO/CI = 4.1/2.1 Assessment: 1. Low volume status 2. With mild to moderately depressed CO on milrinone 3. Mild improvement in hemodynamics with volume loading Plan/Discussion: Hold diuretics. Wean milrinone starting tomorrow. [name], MD 9:12 AM    Medications:     Scheduled Medications:  Chlorhexidine Gluconate Cloth  6 each Topical Daily   fluticasone furoate-vilanterol  1 puff Inhalation Daily   And   umeclidinium bromide  1 puff Inhalation Daily   isosorbide-hydrALAZINE  1 tablet Oral TID   rivaroxaban  20 mg Oral Q supper    senna-docusate  1 tablet Oral BID   sodium chloride flush  10-40 mL Intracatheter Q12H   sodium chloride flush  3 mL Intravenous Q12H   sodium chloride flush  3 mL Intravenous Q12H   spironolactone  12.5 mg Oral Daily    Infusions:  sodium chloride     milrinone 0.25 mcg/kg/min (06/09/22 0821)    PRN Medications: sodium chloride, acetaminophen, albuterol, calcium carbonate, naphazoline-glycerin, ondansetron (ZOFRAN) IV, ondansetron **OR** [DISCONTINUED] ondansetron (ZOFRAN) IV, oxyCODONE, sodium chloride flush, sodium chloride flush    Patient Profile   Ms Eisen is a 76 year old with a h/o severe MR, chronic HFrEF, HTN, permanent atrial fib, CAD s/p CABG x 2 (LIMA to LAD and SVG to OM) in 2016, HLD, IDA, COPD, and Biotronik PPM. GDMT limited - taken off SGLT2i due to UTI/yeast and intolerant entresto due to chest pain.    Presented with increased shortness of breath and chest pain. Out torsemide for some time. Admitted with A/C HFrEF possible low output HF.   Assessment/Plan   1. A/C HFrEF,  - Suspect Combined ICM /NICM with previous A fib RVR. RV Pacing? - In 2021 EF improved 55-60% but over the last year Ef has been down 25-30 with severe MR/TR - Echo this admit EF 20%, moderate LVH, RV moderately reduced, RVSP 46 mmHg, moderate BAE, mild to moderate MR, moderate TR, dilated IVC - Previously discussed upgrading device to CRT d/t high burden RV pacing. She has been hesitant to proceed. Will see if EP can evaluate prior to discharge. NYHA IV. BNP 1732. CXR with vascular congestion.  - Prior to admit she was off torsemide. Refer back to HF Paramedicine.  -Marked volume overload on admit.  Concerned about low-output. Lactic acid up to 2.8, now cleared.  - RHC 10/25 on 0.25 milrinone: Low volume status, mild to moderately depressed CO. Mild improvement in hemodynamics with volume loading. - CO-OX 64% on milrinone 0.25. Wean to 0.125. Repeat CO-OX at noon. If stable will stop milrinone.   - CVP 2. Remains off diuretic today. -BP elevated. Increase bidil to 2 tab TID -No SGLT2i due UTI/yeast infections.  -Failed entresto in the past due to chest pain.  -Continue spiro 12.5 mg daily   2. AKI  -Creatinine baseline  1.2-1.4 -Scr this admit 1.6>1.73>1.36>1.09 -Improving with inotrope support and diuresis   3. MR, Previously Severe - Mild to moderate on echo this admit   4. Permanent A fib  - Biotronik dual chamber PPM/AV nodal ablation 2022.  - Followed by EP for possible upgrade to CRT d/t high burden of  RV pacing but she has not wanted to pursue. See above. -Rate controlled.  -Continue xarelto    5. CAD -H/O CABG x2 2016(LIMA to LAD and SVG to OM)  -HS Trop, 40>28 -No chest pain.  -Continue statin   6. COPD  7. PVCs -~10/min on tele -K 3.7, supp.  -Hopefully will improve with weaning milrinone     Referred back to HF Paramedicine for assistance with medications. She is at high risk for readmission.  Will arrange f/u in HF clinic.   Anticipate possible discharge home tomorrow if able to wean off milrinone today.   Length of Stay: 3  [facility], [place], PA-C  06/09/2022, 8:30 AM  Advanced Heart Failure Team Pager [phone] (M-F; 7a - 5p)  Please contact [facility] for night-coverage after hours (5p -7a ) and weekends on [email or web address]   Patient seen and examined with the above-signed Advanced Practice Provider and/or Housestaff. I personally reviewed laboratory data, imaging studies and relevant notes. I independently examined the patient and formulated the important aspects of the plan. I have edited the note to reflect any of my changes or salient points. I have personally discussed the plan with the patient and/or family.  Cath results reviewed with her. Remains on milrinone. Co-ox looks good. CVP low. Diuretics on hold. Continues with RV pacing.   Denies CP or SOB. Wants to go home  General:  Well appearing. No resp difficulty HEENT:  normal Neck: supple. no JVD. Carotids 2+ bilat; no bruits. No lymphadenopathy or thryomegaly appreciated. Cor: PMI nondisplaced. Regular rate & rhythm. No rubs, gallops or murmurs. Lungs: clear Abdomen: soft, nontender, nondistended. No hepatosplenomegaly. No bruits or masses. Good bowel sounds. Extremities: no cyanosis, clubbing, rash, edema Neuro: alert & orientedx3, cranial nerves grossly intact. moves all 4 extremities w/o difficulty. Affect pleasant  Agree with weaning milrinone. Hold diuretics. I have discussed with EP. They will see her tomorrow to discuss possible CRT upgrade in setting of possible RV pacing CM. I had long talk with Ms. Cozad about this and she seems willing to proceed but she has waffled on this before.   Total time spent 35 minutes. Over half that time spent discussing above.    [name], MD  12:34 PM

## 2022-06-09 NOTE — Progress Notes (Signed)
   CO-OX stable on lower dose of milrinone. Stop milrinone. Repeat CO-OX in am.   Aundria Bitterman NP-C  3:58 PM

## 2022-06-10 ENCOUNTER — Telehealth: Payer: Self-pay

## 2022-06-10 ENCOUNTER — Other Ambulatory Visit (HOSPITAL_COMMUNITY): Payer: Self-pay

## 2022-06-10 DIAGNOSIS — I5023 Acute on chronic systolic (congestive) heart failure: Secondary | ICD-10-CM | POA: Diagnosis not present

## 2022-06-10 LAB — LIPOPROTEIN A (LPA): Lipoprotein (a): <8.4 nmol/L (ref <75.0)

## 2022-06-10 LAB — BASIC METABOLIC PANEL
Anion gap: 7 (ref 5–15)
BUN: 17 mg/dL (ref 8–23)
CO2: 21 mmol/L — ABNORMAL LOW (ref 22–32)
Calcium: 8.6 mg/dL — ABNORMAL LOW (ref 8.9–10.3)
Chloride: 111 mmol/L (ref 98–111)
Creatinine, Ser: 1.2 mg/dL — ABNORMAL HIGH (ref 0.44–1.00)
GFR, Estimated: 47 mL/min — ABNORMAL LOW (ref >60)
Glucose, Bld: 103 mg/dL — ABNORMAL HIGH (ref 70–99)
Potassium: 3.8 mmol/L (ref 3.5–5.1)
Sodium: 139 mmol/L (ref 135–145)

## 2022-06-10 LAB — COOXEMETRY PANEL
Carboxyhemoglobin: 1.1 % (ref 0.5–1.5)
Carboxyhemoglobin: 1.4 % (ref 0.5–1.5)
Methemoglobin: 0.7 % (ref 0.0–1.5)
Methemoglobin: <0.7 % (ref 0.0–1.5)
O2 Saturation: 56.8 %
O2 Saturation: 62.6 %
Total hemoglobin: 15.3 g/dL (ref 12.0–16.0)
Total hemoglobin: 15 g/dL (ref 12.0–16.0)

## 2022-06-10 LAB — MAGNESIUM: Magnesium: 1.9 mg/dL (ref 1.7–2.4)

## 2022-06-10 SURGERY — ECHOCARDIOGRAM, TRANSESOPHAGEAL
Anesthesia: Monitor Anesthesia Care

## 2022-06-10 MED ORDER — SPIRONOLACTONE 25 MG PO TABS: 12.5000 mg | ORAL_TABLET | Freq: Every day | ORAL | Status: DC

## 2022-06-10 MED ORDER — HYDRALAZINE HCL 25 MG PO TABS
25.0000 mg | ORAL_TABLET | Freq: Three times a day (TID) | ORAL | 0 refills | Status: DC
  Filled 2022-06-10: qty 90, 30d supply, fill #0

## 2022-06-10 MED ORDER — TORSEMIDE 20 MG PO TABS
20.0000 mg | ORAL_TABLET | Freq: Every day | ORAL | 1 refills | Status: DC
  Filled 2022-06-10: qty 30, 30d supply, fill #0

## 2022-06-10 MED ORDER — TORSEMIDE 20 MG PO TABS
20.0000 mg | ORAL_TABLET | Freq: Every day | ORAL | Status: DC
  Administered 2022-06-10: 20 mg via ORAL
  Filled 2022-06-10: qty 1

## 2022-06-10 MED ORDER — ISOSORBIDE MONONITRATE ER 30 MG PO TB24
30.0000 mg | ORAL_TABLET | Freq: Every day | ORAL | 0 refills | Status: DC
  Filled 2022-06-10: qty 30, 30d supply, fill #0

## 2022-06-10 MED ORDER — POTASSIUM CHLORIDE CRYS ER 20 MEQ PO TBCR
40.0000 meq | EXTENDED_RELEASE_TABLET | Freq: Once | ORAL | Status: AC
  Administered 2022-06-10: 40 meq via ORAL
  Filled 2022-06-10: qty 2

## 2022-06-10 MED ORDER — LOSARTAN POTASSIUM 50 MG PO TABS
50.0000 mg | ORAL_TABLET | Freq: Every day | ORAL | Status: DC
  Administered 2022-06-10: 50 mg via ORAL
  Filled 2022-06-10: qty 1

## 2022-06-10 MED ORDER — ISOSORB DINITRATE-HYDRALAZINE 20-37.5 MG PO TABS
2.0000 | ORAL_TABLET | Freq: Three times a day (TID) | ORAL | 1 refills | Status: DC
  Filled 2022-06-10: qty 90, 15d supply, fill #0

## 2022-06-10 NOTE — Progress Notes (Signed)
Pt discharge to the discharge lounge

## 2022-06-10 NOTE — Progress Notes (Signed)
   06/10/22 1159  Mobility  Activity Ambulated with assistance in hallway  Level of Assistance Modified independent, requires aide device or extra time  Assistive Device None  Distance Ambulated (ft) 200 ft  Activity Response Tolerated well  Mobility Referral Yes  $Mobility charge 1 Mobility   Mobility Specialist Progress Note  Pre-Mobility: 95% SpO2 During Mobility: 95% SpO2 Post-Mobility: 96% SpO2  Received pt EOB and agreeable. Mobility cut short d/t SOB. Returned to EOB w/ all needs met and call bell in reach.  Lucious Groves Mobility Specialist

## 2022-06-10 NOTE — Progress Notes (Addendum)
Advanced Heart Failure Rounding Note  PCP-Cardiologist: [name], MD   Subjective:    RHC 10/25: Low volume status and CI (thermo) 1.9 on milrinone. CI improved with giving back volume.   CO-OX 57%, milrinone stopped yesterday. Diuretics held 2/2 low CVP. Scr 1.2 today  CVP 2>7/8 today   No complaints just uncomfortable in bed. Denies CP and SOB.  Objective:   Weight Range: 82.8 kg Body mass index is 28.6 kg/m.   Vital Signs:   Temp:  [97.6 F (36.4 C)-97.9 F (36.6 C)] 97.6 F (36.4 C) (10/27 0729) Pulse Rate:  [76-88] 76 (10/27 0729) Resp:  [12-25] 18 (10/27 0729) BP: (143-179)/(75-121) 161/75 (10/27 0729) SpO2:  [92 %-99 %] 92 % (10/27 0729) Weight:  [82.8 kg] 82.8 kg (10/27 0637) Last BM Date : 06/09/22  Weight change: Filed Weights   06/07/22 0400 06/09/22 0006 06/10/22 0637  Weight: 86.2 kg 83 kg 82.8 kg    Intake/Output:   Intake/Output Summary (Last 24 hours) at 06/10/2022 0927 Last data filed at 06/10/2022 0858 Gross per 24 hour  Intake 695 ml  Output 250 ml  Net 445 ml      Physical Exam   CVP 7/8 General:  well appearing.  No respiratory difficulty HEENT: normal Neck: supple. JVD ~7 cm. Carotids 2+ bilat; no bruits. No lymphadenopathy or thyromegaly appreciated. Cor: PMI nondisplaced. Regular rate & rhythm. No rubs, gallops or murmurs. Lungs: clear Abdomen: soft, nontender, nondistended. No hepatosplenomegaly. No bruits or masses. Good bowel sounds. Extremities: no cyanosis, clubbing, rash, edema. PICC RUE Neuro: alert & oriented x 3, cranial nerves grossly intact. moves all 4 extremities w/o difficulty. Affect pleasant.   Telemetry   A fib 80s, ~ 6/12PVCs/min, V paced (personally reviewed)  Labs    CBC Recent Labs    06/08/22 0858 06/08/22 0859  HGB 16.0* 14.6  HCT 47.0* 37.1   Basic Metabolic Panel Recent Labs    06/08/22 0255 06/08/22 0858 06/09/22 0500 06/10/22 0447  NA 140   < > 138 139  K 3.6   < > 3.7 3.8   CL 107  --  107 111  CO2 24  --  22 21*  GLUCOSE 115*  --  107* 103*  BUN 29*  --  19 17  CREATININE 1.36*  --  1.09* 1.20*  CALCIUM 9.0  --  8.5* 8.6*  MG 1.7  --  3.3*  --    < > = values in this interval not displayed.   Liver Function Tests No results for input(s): "AST", "ALT", "ALKPHOS", "BILITOT", "PROT", "ALBUMIN" in the last 72 hours. No results for input(s): "LIPASE", "AMYLASE" in the last 72 hours. Cardiac Enzymes No results for input(s): "CKTOTAL", "CKMB", "CKMBINDEX", "TROPONINI" in the last 72 hours.  BNP: BNP (last 3 results) Recent Labs    04/28/22 1203 05/03/22 1003 06/06/22 0615  BNP 1,166.4* 684.0* 1,732.7*    ProBNP (last 3 results) No results for input(s): "PROBNP" in the last 8760 hours.   D-Dimer No results for input(s): "DDIMER" in the last 72 hours. Hemoglobin A1C No results for input(s): "HGBA1C" in the last 72 hours. Fasting Lipid Panel No results for input(s): "CHOL", "HDL", "LDLCALC", "TRIG", "CHOLHDL", "LDLDIRECT" in the last 72 hours. Thyroid Function Tests No results for input(s): "TSH", "T4TOTAL", "T3FREE", "THYROIDAB" in the last 72 hours.  Invalid input(s): "FREET3"  Other results:   Imaging    No results found.   Medications:     Scheduled Medications:  Chlorhexidine Gluconate Cloth  6 each Topical Daily   fluticasone furoate-vilanterol  1 puff Inhalation Daily   And   umeclidinium bromide  1 puff Inhalation Daily   isosorbide-hydrALAZINE  2 tablet Oral TID   potassium chloride  40 mEq Oral Once   rivaroxaban  20 mg Oral Q supper   senna-docusate  1 tablet Oral BID   sodium chloride flush  10-40 mL Intracatheter Q12H   sodium chloride flush  3 mL Intravenous Q12H   sodium chloride flush  3 mL Intravenous Q12H   spironolactone  12.5 mg Oral Daily    Infusions:  sodium chloride      PRN Medications: sodium chloride, acetaminophen, albuterol, calcium carbonate, naphazoline-glycerin, ondansetron (ZOFRAN) IV,  ondansetron **OR** [DISCONTINUED] ondansetron (ZOFRAN) IV, oxyCODONE, sodium chloride flush, sodium chloride flush    Patient Profile   [name] [name] is a 76 year old with a h/o severe MR, chronic HFrEF, HTN, permanent atrial fib, CAD s/p CABG x 2 (LIMA to LAD and SVG to OM) in 2016, HLD, IDA, COPD, and Biotronik PPM. GDMT limited - taken off SGLT2i due to UTI/yeast and intolerant entresto due to chest pain.    Presented with increased shortness of breath and chest pain. Out torsemide for some time. Admitted with A/C HFrEF possible low output HF.   Assessment/Plan   1. A/C HFrEF,  - Suspect Combined ICM /NICM with previous A fib RVR. RV Pacing? - In 2021 EF improved 55-60% but over the last year Ef has been down 25-30 with severe MR/TR - Echo this admit EF 20%, moderate LVH, RV moderately reduced, RVSP 46 mmHg, moderate BAE, mild to moderate MR, moderate TR, dilated IVC - Previously discussed upgrading device to CRT d/t high burden RV pacing. She has been hesitant to proceed. EP to see today prior to possible d/c. NYHA IV. BNP 1732. CXR with vascular congestion.  - Prior to admit she was off torsemide. Refer back to HF Paramedicine.  -Marked volume overload on admit.  Concerned about low-output. Lactic acid up to 2.8, now cleared.  - RHC 10/25 on 0.25 milrinone: Low volume status, mild to moderately depressed CO. Mild improvement in hemodynamics with volume loading. - CO-OX 57% milrinone weaned off yesterday - CVP 7 today. Diuretics held yesterday, will restart home Torsemide 20 mg daily - BP elevated. Bidil increased to 2 tab TID, adding home losartan today -No SGLT2i due UTI/yeast infections.  -Failed entresto in the past due to chest pain.  -Continue spiro 12.5 mg daily   2. AKI  -Creatinine baseline  1.2-1.4 -Scr this admit 1.6>1.73>1.36>1.09>1.2 today   3. MR, Previously Severe - Mild to moderate on echo this admit   4. Permanent A fib  - Biotronik dual chamber PPM/AV nodal  ablation 2022.  - Followed by EP for possible upgrade to CRT d/t high burden of RV pacing but she has not wanted to pursue. See above. -Rate controlled.  -Continue xarelto    5. CAD -H/O CABG x2 2016(LIMA to LAD and SVG to OM)  -HS Trop, 40>28 -No chest pain.  -Continue statin   6. COPD  7. PVCs -~10/min on tele -K 3.7, supp.  -Hopefully will improve with weaning milrinone  8. Hypertension - BP remains elevated this am - On Bidil 20-37.5 2 tab BID (increased yesterday) - will restart home losartan 50 mg daily   Referred back to HF Paramedicine for assistance with medications. She is at high risk for readmission.  Will arrange f/u in HF  clinic.   Possible d/c home today:  AHF meds at d/c: BIDIL 20-37.5 2 tab BID Losartan '50mg'$  daily Xarelto '20mg'$  daily Spironolactone 12.5 mg daily Torsemide '20mg'$  daily  Length of Stay: [place] AGACNP-BC  06/10/2022, 9:27 AM  Advanced Heart Failure Team Pager [phone] (M-F; 7a - 5p)  Please contact [facility] for night-coverage after hours (5p -7a ) and weekends on [email or web address]   Patient seen and examined with the above-signed Advanced Practice Provider and/or Housestaff. I personally reviewed laboratory data, imaging studies and relevant notes. I independently examined the patient and formulated the important aspects of the plan. I have edited the note to reflect any of my changes or salient points. I have personally discussed the plan with the patient and/or family.  She feels better today. Co-ox and volume status stable off milrinone   Denies SOB, orthopnea or PND.   Wants to go home  General:  Well appearing. No resp difficulty HEENT: normal Neck: supple. no JVD. Carotids 2+ bilat; no bruits. No lymphadenopathy or thryomegaly appreciated. Cor: PMI nondisplaced. Regular rate & rhythm. No rubs, gallops or murmurs. Lungs: clear Abdomen: soft, nontender, nondistended. No hepatosplenomegaly. No bruits or masses. Good  bowel sounds. Extremities: no cyanosis, clubbing, rash, edema Neuro: alert & orientedx3, cranial nerves grossly intact. moves all 4 extremities w/o difficulty. Affect pleasant  Ok for d/c today. Again discussed need for CRT upgrade to reverse possible RV pacing CM. I have discussed this with EP who have seen her as well (and arranged outpatient f/u). Despite these discussions she remains unsure if she wants to proceed.   Agree with meds as above.   Total time spent 35 minutes. Over half that time spent discussing above.    [name], MD  1:46 PM

## 2022-06-10 NOTE — Discharge Summary (Addendum)
Physician Discharge Summary  [name] [name] ZOX:[id number] DOB: 28-Dec-1945 DOA: 06/06/2022  PCP: Janith Lima, MD  Admit date: 06/06/2022 Discharge date: 06/10/2022  Time spent: 45 minutes  Recommendations for Outpatient Follow-up:  Advanced CHF team in 1 to 2 weeks  Discharge Diagnoses:  Principal Problem:   Acute on chronic systolic CHF (congestive heart failure) ([place]) Active Problems:   Acute kidney injury superimposed on chronic kidney disease ([place])   Coronary artery disease due to lipid rich plaque   Persistent atrial fibrillation (HCC)   Essential hypertension   COPD (chronic obstructive pulmonary disease) ([place])   Controlled type 2 diabetes mellitus without complication, without long-term current use of insulin ([place])   Thrombocytopenia ([place])   Discharge Condition: Improved  Diet recommendation: Heart healthy  Filed Weights   06/07/22 0400 06/09/22 0006 06/10/22 0637  Weight: 86.2 kg 83 kg 82.8 kg    History of present illness:  76 yo female h/o severe MR, chronic HFrEF, HTN, permanent atrial fib, CAD s/p CABG x 2 (LIMA to LAD and SVG to OM) in 2016, HLD, IDA, COPD, and PPM, limited GDMT presented to ED w/ dyspnea Chest radiograph with cardiomegaly and bilateral hilar vascular congestion, no effusions. Patient was placed on furosemide and started on milrinone for low output CHF  Hospital Course:   Acute on chronic systolic CHF Moderate to Severe MR Low output state Echo EF < 20%, global hypokinesis, moderate LVH, RV systolic function with moderate reduction,. Mild to moderate MR. Moderate TR.  -volume overloaded on admission, elevated lactate, concerning for low output -CHF team following, started on milrinone, IV Lasix -CHF team following, right heart cath 10/25 with low filling pressures, milrinone weaned off -limited GDMT- no SGLT2 w/ Yeast/UTIs and intolerant to entresto -Discharged home on oral torsemide, BiDil(changed to hydralazine and Imdur, BiDil was  not covered by insurance), losartan and Aldactone -Follow-up in CHF clinic    AKI on CKD3a -baseline creat 1.2-1.4 -higher this admission, cardiorenal -Now improving on milrinone, creatinine down to 1.2, off milrinone   CAD/CANG -stable, continue statin   Persistent atrial fibrillation (HCC) -has dual chamber PPM - continue anticoagulation with rivaroxaban -rate controlled   COPD  -stable   DM 2 -stable CBGs   Thrombocytopenia (HCC) -Mild, monitor on Xarelto  Consultations: CHF team  Discharge Exam: Vitals:   06/10/22 0729 06/10/22 1013  BP: (!) 161/75 (!) 148/78  Pulse: 76   Resp: 18 17  Temp: 97.6 F (36.4 C)   SpO2: 92%      Discharge Instructions   Discharge Instructions     Diet - low sodium heart healthy   Complete by: As directed    Increase activity slowly   Complete by: As directed       Allergies as of 06/10/2022       Reactions   Bee Venom Anaphylaxis   Ivp Dye [iodinated Contrast Media] Anaphylaxis   Ace Inhibitors    Other reaction(s): angioedema   Atenolol    Other reaction(s): severe headaches   Codeine Nausea And Vomiting   Entresto [sacubitril-valsartan] Other (See Comments)   Chest pain    Penicillin G    Other reaction(s): rash   Shrimp [shellfish Allergy] Swelling   Simvastatin    Other reaction(s): not sure   Jardiance [empagliflozin] Other (See Comments)   Caused Boils        Medication List     STOP taking these medications    metoprolol succinate 100 MG 24 hr tablet Commonly  known as: TOPROL-XL       TAKE these medications    acetaminophen 325 MG tablet Commonly known as: TYLENOL Take 2 tablets (650 mg total) by mouth every 4 (four) hours as needed for headache or mild pain.   albuterol 108 (90 Base) MCG/ACT inhaler Commonly known as: VENTOLIN HFA Inhale 1-2 puffs into the lungs every 6 (six) hours as needed for wheezing or shortness of breath.   allopurinol 100 MG tablet Commonly known as:  ZYLOPRIM Take 100 mg by mouth daily as needed (gout symptoms).   atorvastatin 10 MG tablet Commonly known as: LIPITOR Take 1 tablet (10 mg total) by mouth at bedtime.   carboxymethylcellulose 0.5 % Soln Commonly known as: REFRESH PLUS Place 1 drop into both eyes daily as needed (dry eyes).   hydrALAZINE 25 MG tablet Commonly known as: APRESOLINE Take 1 tablet (25 mg total) by mouth 3 (three) times daily.   ipratropium-albuterol 0.5-2.5 (3) MG/3ML Soln Commonly known as: DUONEB Take 3 mLs by nebulization every 6 (six) hours as needed. What changed: reasons to take this   isosorbide mononitrate 30 MG 24 hr tablet Commonly known as: IMDUR Take 1 tablet (30 mg total) by mouth daily.   losartan 50 MG tablet Commonly known as: COZAAR Take 1 tablet (50 mg total) by mouth daily.   metFORMIN 500 MG tablet Commonly known as: GLUCOPHAGE Take 500 mg by mouth daily with breakfast. Daily with a meal   pantoprazole 40 MG tablet Commonly known as: PROTONIX Take 1 tablet (40 mg total) by mouth daily.   rivaroxaban 20 MG Tabs tablet Commonly known as: XARELTO Take 1 tablet (20 mg total) by mouth daily with supper. Resume from 11/01/2019   spironolactone 25 MG tablet Commonly known as: ALDACTONE Take 0.5 tablets (12.5 mg total) by mouth daily. What changed: how much to take   torsemide 20 MG tablet Commonly known as: DEMADEX Take 1 tablet (20 mg total) by mouth daily.   Trelegy Ellipta 100-62.5-25 MCG/ACT Aepb Generic drug: Fluticasone-Umeclidin-Vilant Inhale 1 puff into the lungs daily.       Allergies  Allergen Reactions   Bee Venom Anaphylaxis   Ivp Dye [Iodinated Contrast Media] Anaphylaxis   Ace Inhibitors     Other reaction(s): angioedema   Atenolol     Other reaction(s): severe headaches   Codeine Nausea And Vomiting   Entresto [Sacubitril-Valsartan] Other (See Comments)    Chest pain    Penicillin G     Other reaction(s): rash   Shrimp [Shellfish Allergy]  Swelling   Simvastatin     Other reaction(s): not sure   Jardiance [Empagliflozin] Other (See Comments)    Caused Boils     Follow-up Information     [facility] Follow up on 06/22/2022.   Specialty: Cardiology Why: Advanced Heart Failure Clinic 3:30 pm Entrance C, Free Valet Parking Please bring all meds to appt Contact information: [address] [id number] mc [place]        Evans Lance, MD Follow up.   Specialty: Cardiology Why: 06/30/22 @ 1:45PM Contact information: [address] [place] [address] [phone]         Janith Lima, MD. Go on 06/16/2022.   Specialty: Internal Medicine Why: '@1'$ :00pm Contact information: [place] [address] [place], [facility] Follow up.   Specialty: [place]  Why: Home Health RN-will call to arrange appt Contact information: [place] [address] [phone]                  The results of significant diagnostics from this hospitalization (including imaging, microbiology, ancillary and laboratory) are listed below for reference.    Significant Diagnostic Studies: CARDIAC CATHETERIZATION  Result Date: 06/08/2022 Findings: On milrinone 0.25 RA = 1 RV = 27/1 PA = 32/13 (18) PCW = 2 Fick cardiac output/index = 5.3/2.7 Thermo = 3.8/1.9 PVR = 3.0 (fick) 4.2 (TD) Ao sat = 93% PA sat = 71%, 68% Given 175 cc NS load PCW = 5 TD CO/CI = 4.1/2.1 Assessment: 1. Low volume status 2. With mild to moderately depressed CO on milrinone 3. Mild improvement in hemodynamics with volume loading Plan/Discussion: Hold diuretics. Wean milrinone starting tomorrow. [name], MD 9:12 AM  DG Chest Port 1 View  Result Date: 06/07/2022 CLINICAL DATA:  Status post peripherally inserted central catheter (PICC) central line placement EXAM:  PORTABLE CHEST 1 VIEW COMPARISON:  Chest x-ray June 06, 2022. FINDINGS: Similar enlarged cardiac silhouette. Median sternotomy. Left atrial appendage clip. Left subclavian approach cardiac rhythm maintenance device.Right PICC with tip projecting at the lower SVC. Mild interstitial coarsening without Kerley B lines or effusions. No consolidation. IMPRESSION: Right PICC with tip projecting at the lower SVC. Electronically Signed   By: Margaretha Sheffield M.D.   On: 06/07/2022 11:45   [place] EKG SITE RITE  Result Date: 06/07/2022 If Site Rite image not attached, placement could not be confirmed due to current cardiac rhythm.  ECHOCARDIOGRAM COMPLETE  Result Date: 06/06/2022    ECHOCARDIOGRAM REPORT   Patient Name:   CHELLI YERKES Date of Exam: 06/06/2022 Medical Rec #:  [id number]     Height:       67.5 in Accession #:    [id number]    Weight:       188.0 lb Date of Birth:  09/07/1945     BSA:          1.981 m Patient Age:    38 years      BP:           152/90 mmHg Patient Gender: F             HR:           75 bpm. Exam Location:  Inpatient Procedure: 2D Echo, Cardiac Doppler and Color Doppler Indications:    Congenital Heart Disease Q24.0  History:        Patient has prior history of Echocardiogram examinations.                 Cardiomyopathy, CAD, Prior CABG and Pacemaker, COPD,                 Arrythmias:Atrial Fibrillation; Risk Factors:Hypertension.  Sonographer:    Eartha Inch Referring Phys: [id number] AMY D CLEGG  Sonographer Comments: Image acquisition challenging due to patient body habitus and Image acquisition challenging due to respiratory motion. IMPRESSIONS  1. Left ventricular ejection fraction, by estimation, is 20%. The left ventricle has severely decreased function. The left ventricle demonstrates global hypokinesis. There is moderate concentric left ventricular hypertrophy. Left ventricular diastolic parameters are indeterminate.  2. Right ventricular systolic function is moderately reduced.  The right ventricular size is normal. There is moderately elevated pulmonary artery systolic pressure. The estimated right ventricular systolic pressure is 40.8 mmHg.  3. Left atrial size was moderately dilated.  4. Right atrial size was moderately dilated.  5. The mitral valve is abnormal. Mild to moderate mitral valve regurgitation. No evidence of mitral stenosis.  6. The tricuspid valve is abnormal. Tricuspid valve regurgitation is moderate.  7. The aortic valve is tricuspid. There is mild calcification of the aortic valve. Aortic valve regurgitation is trivial. No aortic stenosis is present.  8. The inferior vena cava is dilated in size with <50% respiratory variability, suggesting right atrial pressure of 15 mmHg.  9. The patient is in atrial fibrillation. FINDINGS  Left Ventricle: Left ventricular ejection fraction, by estimation, is 20%. The left ventricle has severely decreased function. The left ventricle demonstrates global hypokinesis. The left ventricular internal cavity size was normal in size. There is moderate concentric left ventricular hypertrophy. Left ventricular diastolic parameters are indeterminate. Right Ventricle: The right ventricular size is normal. No increase in right ventricular wall thickness. Right ventricular systolic function is moderately reduced. There is moderately elevated pulmonary artery systolic pressure. The tricuspid regurgitant velocity is 2.79 m/s, and with an assumed right atrial pressure of 15 mmHg, the estimated right ventricular systolic pressure is 00.8 mmHg. Left Atrium: Left atrial size was moderately dilated. Right Atrium: Right atrial size was moderately dilated. Pericardium: There is no evidence of pericardial effusion. Mitral Valve: The mitral valve is abnormal. There is mild calcification of the mitral valve leaflet(s). Mild mitral annular calcification. Mild to moderate mitral valve regurgitation. No evidence of mitral valve stenosis. Tricuspid Valve: The  tricuspid valve is abnormal. Tricuspid valve regurgitation is moderate. Aortic Valve: The aortic valve is tricuspid. There is mild calcification of the aortic valve. Aortic valve regurgitation is trivial. No aortic stenosis is present. Pulmonic Valve: The pulmonic valve was normal in structure. Pulmonic valve regurgitation is not visualized. Aorta: The aortic root is normal in size and structure. Venous: The inferior vena cava is dilated in size with less than 50% respiratory variability, suggesting right atrial pressure of 15 mmHg. IAS/Shunts: No atrial level shunt detected by color flow Doppler. Additional Comments: A device lead is visualized in the right ventricle.  LEFT VENTRICLE PLAX 2D LVIDd:         5.70 cm      Diastology LVIDs:         4.60 cm      LV e' medial:    4.28 cm/s LV PW:         0.90 cm      LV E/e' medial:  20.4 LV IVS:        0.90 cm      LV e' lateral:   11.50 cm/s LVOT diam:     2.00 cm      LV E/e' lateral: 7.6 LV SV:         41 LV SV Index:   21 LVOT Area:     3.14 cm  LV Volumes (MOD) LV vol d, MOD A2C: 139.0 ml LV vol d, MOD A4C: 128.0 ml LV vol s, MOD A2C: 119.0 ml LV vol s, MOD A4C: 92.3 ml LV SV MOD A2C:     20.0 ml LV SV MOD A4C:     128.0 ml LV SV MOD BP:      31.4 ml RIGHT VENTRICLE            IVC RV S prime:     7.54 cm/s  IVC diam: 2.20 cm TAPSE (M-mode): 1.5 cm LEFT ATRIUM           Index LA diam:  5.30 cm 2.68 cm/m LA Vol (A4C): 84.2 ml 42.51 ml/m  AORTIC VALVE LVOT Vmax:   109.00 cm/s LVOT Vmean:  68.300 cm/s LVOT VTI:    0.132 m  AORTA Ao Root diam: 2.50 cm MITRAL VALVE               TRICUSPID VALVE MV Area (PHT): 4.12 cm    TR Peak grad:   31.1 mmHg MV Decel Time: 184 msec    TR Mean grad:   21.0 mmHg MR Peak grad: 111.1 mmHg   TR Vmax:        279.00 cm/s MR Mean grad: 75.0 mmHg    TR Vmean:       222.0 cm/s MR Vmax:      527.00 cm/s MR Vmean:     414.0 cm/s   SHUNTS MV E velocity: 87.50 cm/s  Systemic VTI:  0.13 m                            Systemic Diam: 2.00 cm  Dalton McleanMD Electronically signed by Franki Monte Signature Date/Time: 06/06/2022/3:51:09 PM    Final    [place] EKG SITE RITE  Result Date: 06/06/2022 If Site Rite image not attached, placement could not be confirmed due to current cardiac rhythm.  DG Chest Port 1 View  Result Date: 06/06/2022 CLINICAL DATA:  Shortness of breath EXAM: PORTABLE CHEST 1 VIEW COMPARISON:  05/03/2022 FINDINGS: Cardiomegaly. Upper mediastinal widening towards the right is from ectatic vessels by prior CT. Left atrial clipping. Pacer lead into the right ventricle. Mild interstitial coarsening without Kerley lines or effusion. No air bronchogram. IMPRESSION: Cardiomegaly and borderline vascular congestion. Electronically Signed   By: Jorje Guild M.D.   On: 06/06/2022 06:25    Microbiology: No results found for this or any previous visit (from the past 240 hour(s)).   Labs: Basic Metabolic Panel: Recent Labs  Lab 06/06/22 0615 06/07/22 0130 06/08/22 0255 06/08/22 0858 06/08/22 0859 06/09/22 0500 06/10/22 0447 06/10/22 1037  NA 141 141 140 144 147* 138 139  --   K 4.3 3.9 3.6 3.3* 2.8* 3.7 3.8  --   CL 115* 110 107  --   --  107 111  --   CO2 17* 23 24  --   --  22 21*  --   GLUCOSE 105* 136* 115*  --   --  107* 103*  --   BUN 28* 36* 29*  --   --  19 17  --   CREATININE 1.62* 1.73* 1.36*  --   --  1.09* 1.20*  --   CALCIUM 9.1 9.3 9.0  --   --  8.5* 8.6*  --   MG  --   --  1.7  --   --  3.3*  --  1.9   Liver Function Tests: No results for input(s): "AST", "ALT", "ALKPHOS", "BILITOT", "PROT", "ALBUMIN" in the last 168 hours. No results for input(s): "LIPASE", "AMYLASE" in the last 168 hours. No results for input(s): "AMMONIA" in the last 168 hours. CBC: Recent Labs  Lab 06/06/22 0615 06/07/22 0130 06/08/22 0858 06/08/22 0859  WBC 6.5 6.6  --   --   HGB 14.2 15.2* 16.0* 14.6  HCT 44.5 47.6* 47.0* 43.0  MCV 91.2 90.2  --   --   PLT 108* 116*  --   --    Cardiac Enzymes: No results  for input(s): "CKTOTAL", "CKMB", "CKMBINDEX", "TROPONINI"  in the last 168 hours. BNP: BNP (last 3 results) Recent Labs    04/28/22 1203 05/03/22 1003 06/06/22 0615  BNP 1,166.4* 684.0* 1,732.7*    ProBNP (last 3 results) No results for input(s): "PROBNP" in the last 8760 hours.  CBG: Recent Labs  Lab 06/09/22 0628  GLUCAP 126*       Signed:  Domenic Polite MD.  [facility] 06/10/2022, 11:53 AM

## 2022-06-10 NOTE — Progress Notes (Signed)
PICC line removed per order. Pressure held to achieve hemostasis.  Aftercare instructions reviewed with pt and pt verbalized understanding.  Vaseline/gauze/tegaderm applied.

## 2022-06-10 NOTE — Patient Outreach (Signed)
Received a hospital referral for [name] [name].   Patient has [facility]. Following their workflow I have sent this referral to their Care Management group through email [email or web address] for Care Management.     [place] [facility], [place], [place] Management [phone]

## 2022-06-10 NOTE — Progress Notes (Addendum)
Visited with the patient today to discuss perhaps rhythm/device options. She remains weary about procedures but willing to sit down with Dr. Lovena Le and re-discuss it further She has an appt to see him 06/30/22, I placed the appointment information in her AVS  Tommye Standard, PA-C

## 2022-06-10 NOTE — Consult Note (Signed)
   [facility] Inpatient Consult   06/10/2022  LESLEE SUIRE [birth year] [id number]  [place] [employer] [ACO] Patient: [employer]  Primary Care Provider:  Janith Lima, MD [facility] which is listed to provide the [facility] follow up  Followup:  TOC to care needs  Noted that the patient is now for [facility] Paramedicine is noted.  Met with patient at bedside and she wanted her PCP information.  Gave an appointment follow up reminder card with information.  She also states she used to have [facility] for CM.  No other community needs assessed for [facility] CM team.  Natividad Brood, RN BSN [place]  [phone] business mobile phone Toll free office [phone]  *[place]  [phone] Fax number: [phone] [email or web address]

## 2022-06-10 NOTE — Progress Notes (Signed)
H&V Care Navigation CSW Progress Note  Outpatient HF CSW consulted to enroll pt in [employer].  CSW met with pt at bedside and confirmed that she is agreeable to enrollment.  Pt has is familiar with program from past enrollments so full assessment not completed at this time.   SDOH Screenings   Food Insecurity: No Food Insecurity (06/09/2022)  Housing: Low Risk  (06/09/2022)  Transportation Needs: No Transportation Needs (06/09/2022)  Utilities: Not At Risk (06/09/2022)  Depression (PHQ2-9): Low Risk  (03/16/2022)  Financial Resource Strain: Low Risk  (06/07/2018)  Recent Concern: Financial Resource Strain - Medium Risk (05/21/2018)  Tobacco Use: Medium Risk (06/08/2022)    Jorge Ny, LCSW Clinical Social Worker [facility] Desk#: [phone] Cell#: [phone]

## 2022-06-10 NOTE — Care Management Important Message (Signed)
Important Message  Patient Details  Name: [name] [name] MRN: [id number] Date of Birth: Apr 13, 1946   Medicare Important Message Given:  Yes     Shelda Altes 06/10/2022, 7:48 AM

## 2022-06-10 NOTE — TOC Initial Note (Signed)
Transition of Care [facility]) - Initial/Assessment Note    Patient Details  Name: [name] [name] MRN: [id number] Date of Birth: [birth year]  [facility] CM/SW Contact:    Erenest Rasher, RN Phone Number: [phone] 06/10/2022, 12:19 PM  Clinical Narrative:                  HF TOC CM spoke to pt at bedside. Offered choice for [facility]. States she had HH in the past. Pt had [facility]. Contacted Delrae Rend with new referral. Pt will outpt HF Paramedicine visits. Meds to come up from [place] prior to dc.     Expected Discharge Plan: [place] Barriers to Discharge: No Barriers Identified   Patient Goals and CMS Choice   CMS Medicare.gov Compare Post Acute Care list provided to:: Patient Choice offered to / list presented to : Patient  Expected Discharge Plan and Services Expected Discharge Plan: [place]   Discharge Planning Services: CM Consult Post Acute Care Choice: [place] arrangements for the past 2 months: Single Family Home Expected Discharge Date: 06/10/22                         HH Arranged: RN [place] Agency: [place] Date [place]: 06/10/22 Time [place]: 1219 Representative spoke with at [place]: Jenny Reichmann  Prior Living Arrangements/Services Living arrangements for the past 2 months: [place] with:: Self, Friends Patient language and need for interpreter reviewed:: Yes Do you feel safe going back to the place where you live?: Yes      Need for Family Participation in Patient Care: No (Comment) Care giver support system in place?: Yes (comment) Current home services: DME ([facility])    Activities of Daily Living      Permission Sought/Granted Permission sought to share information with : Case Manager, Family Supports, PCP Permission granted to share information with : Yes, Verbal Permission Granted  Share Information with NAME: Ammie Warrick      Permission granted to share info w Relationship: daughter  Permission granted to share info w Contact Information: [phone]  Emotional Assessment Appearance:: Appears younger than stated age Attitude/Demeanor/Rapport: Engaged Affect (typically observed): Accepting Orientation: : Oriented to Self, Oriented to Place, Oriented to  Time, Oriented to Situation   Psych Involvement: No (comment)  Admission diagnosis:  CHF (congestive heart failure) (HCC) [I50.9] Thrombocytopenia (HCC) [D69.6] Elevated troponin [R79.89] AKI (acute kidney injury) ([place]) [N17.9] Acute on chronic congestive heart failure, unspecified heart failure type [facility]) [I50.9] Patient Active Problem List   Diagnosis Date Noted   Acute kidney injury superimposed on chronic kidney disease ([place]) 06/06/2022   Chronic anticoagulation 06/06/2022   Severe mitral regurgitation 06/06/2022   Controlled type 2 diabetes mellitus without complication, without long-term current use of insulin ([place]) 06/06/2022   Pacemaker 02/22/2022   Elevated troponin 02/10/2021   Obstructive sleep apnea 09/21/2020   Tobacco dependence 02/10/2020   Obesity (BMI 30.0-34.9) 02/10/2020   COPD (chronic obstructive pulmonary disease) ([place]) 10/26/2019   Acute on chronic combined systolic and diastolic CHF (congestive heart failure) (HCC)    Thrombocytopenia ([place]) 09/14/2017   Medication noncompliance due to cognitive impairment 09/06/2017   Persistent atrial fibrillation ([place]) 08/04/2017   Paroxysmal atrial fibrillation ([place]) 02/08/2017   Cardiomyopathy, ischemic 02/08/2017   CKD (chronic kidney disease), stage III ([place]) 01/30/2017   S/P CABG x 2 11/14/2014  Coronary artery disease due to lipid rich plaque    Essential hypertension    Acute on chronic systolic CHF (congestive heart failure) ([place]) 10/30/2014   PCP:  Janith Lima, MD Pharmacy:   [facility] - [place], [place] - [address]  [place] [address] Phone: [phone] Fax: [phone]  [id number] [id number]: [phone]: [phone]    Social Determinants of Health (SDOH) Interventions Food Insecurity Interventions: Intervention Not Indicated Housing Interventions: Intervention Not Indicated Transportation Interventions: Intervention Not Indicated Utilities Interventions: Intervention Not Indicated  Readmission Risk Interventions    02/12/2021   10:29 AM 12/09/2020   12:39 PM  Readmission Risk Prevention Plan  Transportation Screening Complete Complete  PCP or Specialist Appt within 3-5 Days Complete Complete  HRI or Home Care Consult Complete Complete  Social Work Consult for RS[place]Planning/Counseling Complete Complete  Palliative Care Screening Not Applicable Complete  Medication Review (Press photographer Complete Complete

## 2022-06-13 ENCOUNTER — Telehealth: Payer: Self-pay | Admitting: Internal Medicine

## 2022-06-13 ENCOUNTER — Telehealth: Payer: Self-pay

## 2022-06-13 NOTE — Telephone Encounter (Signed)
[facility] needs verbal orders for nursing 1 time a week for 6 weeks.

## 2022-06-13 NOTE — Telephone Encounter (Signed)
Verbal order given via secure VM for nursing 1xW for 6w.

## 2022-06-13 NOTE — Telephone Encounter (Signed)
Transition Care Management Follow-up Telephone Call Date of discharge and from where: [place] 06-10-22 Dx: Acute on chronic systolic CHF How have you been since you were released from the hospital? Doing better  Any questions or concerns? No  Items Reviewed: Did the pt receive and understand the discharge instructions provided? Yes  Medications obtained and verified? Yes  Other? No  Any new allergies since your discharge? No  Dietary orders reviewed? No Do you have support at home? Yes   Home Care and Equipment/Supplies: Were home health services ordered? yes If so, what is the name of the agency? Unsure of name   Has the agency set up a time to come to the patient's home? yes Were any new equipment or medical supplies ordered?  No What is the name of the medical supply agency? na Were you able to get the supplies/equipment? not applicable Do you have any questions related to the use of the equipment or supplies? No  Functional Questionnaire: (I = Independent and D = Dependent) ADLs: I  Bathing/Dressing- I  Meal Prep- I  Eating- I  Maintaining continence- I  Transferring/Ambulation- I-  Managing Meds- I  Follow up appointments reviewed:  PCP Hospital f/u appt confirmed? Yes  Scheduled to see Dr Ronnald Ramp on 06-16-22 @ [facility] f/u appt confirmed? Yes  Scheduled to see heart failure clinic on 06-22-22 @ 330pm and Dr Lovena Le 06-30-22 at 145pm. Are transportation arrangements needed? No  If their condition worsens, is the pt aware to call PCP or go to the Emergency Dept.? Yes Was the patient provided with contact information for the PCP's office or ED? Yes Was to pt encouraged to call back with questions or concerns? Yes   Juanda Crumble LPN [place] Direct Dial [phone]

## 2022-06-15 ENCOUNTER — Telehealth: Payer: Self-pay | Admitting: Internal Medicine

## 2022-06-15 ENCOUNTER — Telehealth (HOSPITAL_COMMUNITY): Payer: Self-pay | Admitting: Emergency Medicine

## 2022-06-15 NOTE — Telephone Encounter (Signed)
LVM for pt to rtn my call to schedule AWV with [facility] call back # [phone] 

## 2022-06-16 ENCOUNTER — Encounter: Payer: Self-pay | Admitting: Internal Medicine

## 2022-06-16 ENCOUNTER — Ambulatory Visit (INDEPENDENT_AMBULATORY_CARE_PROVIDER_SITE_OTHER): Payer: HMO | Admitting: Internal Medicine

## 2022-06-16 VITALS — BP 126/78 | HR 75 | Temp 97.9°F | Ht 67.0 in | Wt 185.0 lb

## 2022-06-16 DIAGNOSIS — Z23 Encounter for immunization: Secondary | ICD-10-CM

## 2022-06-16 DIAGNOSIS — D696 Thrombocytopenia, unspecified: Secondary | ICD-10-CM

## 2022-06-16 DIAGNOSIS — I5042 Chronic combined systolic (congestive) and diastolic (congestive) heart failure: Secondary | ICD-10-CM

## 2022-06-16 DIAGNOSIS — H6123 Impacted cerumen, bilateral: Secondary | ICD-10-CM | POA: Diagnosis not present

## 2022-06-16 DIAGNOSIS — E119 Type 2 diabetes mellitus without complications: Secondary | ICD-10-CM | POA: Diagnosis not present

## 2022-06-16 DIAGNOSIS — Z0001 Encounter for general adult medical examination with abnormal findings: Secondary | ICD-10-CM | POA: Diagnosis not present

## 2022-06-16 DIAGNOSIS — N1831 Chronic kidney disease, stage 3a: Secondary | ICD-10-CM | POA: Diagnosis not present

## 2022-06-16 DIAGNOSIS — J441 Chronic obstructive pulmonary disease with (acute) exacerbation: Secondary | ICD-10-CM

## 2022-06-16 NOTE — Patient Instructions (Signed)

## 2022-06-16 NOTE — Progress Notes (Signed)
Subjective:  Patient ID: [name] [name], female    DOB: 1946-08-06  Age: 76 y.o. MRN: [id number]  CC: Annual Exam, Diabetes, and Congestive Heart Failure   HPI [name] [name] presents for a CPX and f/up -   She was recently admitted for a CHF exacerbation.  Since discharge her shortness of breath has is improved.  She is not gaining weight and denies lower extremity edema.  She describes a several year history of discomfort in her ears and decreased hearing.  PCP: Janith Lima, MD   Admit date: 06/06/2022 Discharge date: 06/10/2022   Recommendations for Outpatient Follow-up:  Advanced CHF team in 1 to 2 weeks   Discharge Diagnoses:  Principal Problem:   Acute on chronic systolic CHF (congestive heart failure) ([place]) Active Problems:   Acute kidney injury superimposed on chronic kidney disease (HCC)   Coronary artery disease due to lipid rich plaque   Persistent atrial fibrillation (HCC)   Essential hypertension   COPD (chronic obstructive pulmonary disease) ([place])   Controlled type 2 diabetes mellitus without complication, without long-term current use of insulin (HCC)   Thrombocytopenia (HCC)  Outpatient Medications Prior to Visit  Medication Sig Dispense Refill   acetaminophen (TYLENOL) 325 MG tablet Take 2 tablets (650 mg total) by mouth every 4 (four) hours as needed for headache or mild pain.     albuterol (VENTOLIN HFA) 108 (90 Base) MCG/ACT inhaler Inhale 1-2 puffs into the lungs every 6 (six) hours as needed for wheezing or shortness of breath. 18 g 2   allopurinol (ZYLOPRIM) 100 MG tablet Take 100 mg by mouth daily as needed (gout symptoms).     atorvastatin (LIPITOR) 10 MG tablet Take 1 tablet (10 mg total) by mouth at bedtime. 90 tablet 1   carboxymethylcellulose (REFRESH PLUS) 0.5 % SOLN Place 1 drop into both eyes daily as needed (dry eyes).     Fluticasone-Umeclidin-Vilant (TRELEGY ELLIPTA) 100-62.5-25 MCG/ACT AEPB Inhale 1 puff into the lungs daily. 120 each 1    hydrALAZINE (APRESOLINE) 25 MG tablet Take 1 tablet (25 mg total) by mouth 3 (three) times daily. 90 tablet 0   ipratropium-albuterol (DUONEB) 0.5-2.5 (3) MG/3ML SOLN Take 3 mLs by nebulization every 6 (six) hours as needed. (Patient taking differently: Take 3 mLs by nebulization every 6 (six) hours as needed (shortness of breath).) 360 mL 1   isosorbide mononitrate (IMDUR) 30 MG 24 hr tablet Take 1 tablet (30 mg total) by mouth daily. 30 tablet 0   losartan (COZAAR) 50 MG tablet Take 1 tablet (50 mg total) by mouth daily. 30 tablet 6   metFORMIN (GLUCOPHAGE) 500 MG tablet Take 500 mg by mouth daily with breakfast. Daily with a meal     pantoprazole (PROTONIX) 40 MG tablet Take 1 tablet (40 mg total) by mouth daily. 30 tablet 2   rivaroxaban (XARELTO) 20 MG TABS tablet Take 1 tablet (20 mg total) by mouth daily with supper. Resume from 11/01/2019     spironolactone (ALDACTONE) 25 MG tablet Take 0.5 tablets (12.5 mg total) by mouth daily.     torsemide (DEMADEX) 20 MG tablet Take 1 tablet (20 mg total) by mouth daily. 30 tablet 1   No facility-administered medications prior to visit.    ROS Review of Systems  Constitutional: Negative.  Negative for chills, diaphoresis and fatigue.  HENT:  Positive for ear pain and hearing loss. Negative for ear discharge.   Eyes: Negative.  Negative for visual disturbance.  Respiratory:  Positive  for shortness of breath. Negative for cough, chest tightness and wheezing.   Cardiovascular:  Negative for chest pain, palpitations and leg swelling.  Gastrointestinal:  Negative for abdominal pain, constipation, diarrhea, nausea and vomiting.  Genitourinary: Negative.  Negative for difficulty urinating.  Musculoskeletal: Negative.   Skin: Negative.   Neurological: Negative.  Negative for dizziness and light-headedness.  Hematological:  Negative for adenopathy. Does not bruise/bleed easily.  Psychiatric/Behavioral: Negative.      Objective:  BP 126/78 (BP  Location: Left Arm, Patient Position: Sitting, Cuff Size: Large)   Pulse 75   Temp 97.9 F (36.6 C) (Oral)   Ht '5\' 7"'$  (1.702 m)   Wt 185 lb (83.9 kg)   SpO2 94%   BMI 28.98 kg/m   BP Readings from Last 3 Encounters:  06/16/22 126/78  06/10/22 (!) 148/78  05/18/22 136/80    Wt Readings from Last 3 Encounters:  06/16/22 185 lb (83.9 kg)  06/10/22 182 lb 9.6 oz (82.8 kg)  05/18/22 188 lb (85.3 kg)    Physical Exam Vitals reviewed.  Constitutional:      Appearance: She is not ill-appearing.  HENT:     Right Ear: Ear canal and external ear normal. Decreased hearing noted. There is impacted cerumen.     Left Ear: Ear canal and external ear normal. Decreased hearing noted. There is impacted cerumen.     Mouth/Throat:     Mouth: Mucous membranes are moist.  Eyes:     General: No scleral icterus.    Conjunctiva/sclera: Conjunctivae normal.  Cardiovascular:     Rate and Rhythm: Normal rate and regular rhythm.     Heart sounds: Murmur heard.     Systolic murmur is present with a grade of 1/6.     No friction rub. Gallop present.  Pulmonary:     Effort: Pulmonary effort is normal. No tachypnea, accessory muscle usage or respiratory distress.     Breath sounds: Examination of the right-upper field reveals decreased breath sounds. Examination of the left-upper field reveals decreased breath sounds. Examination of the right-middle field reveals decreased breath sounds. Examination of the left-middle field reveals decreased breath sounds. Examination of the right-lower field reveals decreased breath sounds. Examination of the left-lower field reveals decreased breath sounds. Decreased breath sounds present. No wheezing, rhonchi or rales.  Abdominal:     General: Abdomen is flat.     Palpations: There is no mass.     Tenderness: There is no abdominal tenderness. There is no guarding.     Hernia: No hernia is present.  Musculoskeletal:        General: Normal range of motion.      Cervical back: Neck supple.     Right lower leg: No edema.     Left lower leg: No edema.  Skin:    General: Skin is warm and dry.  Neurological:     General: No focal deficit present.     Mental Status: She is alert.  Psychiatric:        Mood and Affect: Mood normal.     Lab Results  Component Value Date   WBC 6.6 06/07/2022   HGB 14.6 06/08/2022   HCT 43.0 06/08/2022   PLT 116 (L) 06/07/2022   GLUCOSE 103 (H) 06/10/2022   CHOL 109 03/16/2022   TRIG 123.0 03/16/2022   HDL 30.20 (L) 03/16/2022   LDLCALC 54 03/16/2022   ALT 13 05/03/2022   AST 17 05/03/2022   NA 139 06/10/2022   K  3.8 06/10/2022   CL 111 06/10/2022   CREATININE 1.20 (H) 06/10/2022   BUN 17 06/10/2022   CO2 21 (L) 06/10/2022   TSH 1.42 03/16/2022   INR 1.6 (H) 12/08/2020   HGBA1C 6.2 03/16/2022   MICROALBUR 238.9 (H) 03/16/2022    DG Chest Port 1 View  Result Date: 06/07/2022 CLINICAL DATA:  Status post peripherally inserted central catheter (PICC) central line placement EXAM: PORTABLE CHEST 1 VIEW COMPARISON:  Chest x-ray June 06, 2022. FINDINGS: Similar enlarged cardiac silhouette. Median sternotomy. Left atrial appendage clip. Left subclavian approach cardiac rhythm maintenance device.Right PICC with tip projecting at the lower SVC. Mild interstitial coarsening without Kerley B lines or effusions. No consolidation. IMPRESSION: Right PICC with tip projecting at the lower SVC. Electronically Signed   By: Margaretha Sheffield M.D.   On: 06/07/2022 11:45   [place] EKG SITE RITE  Result Date: 06/07/2022 If Site Rite image not attached, placement could not be confirmed due to current cardiac rhythm.  ECHOCARDIOGRAM COMPLETE  Result Date: 06/06/2022    ECHOCARDIOGRAM REPORT   Patient Name:   [name] [name] Date of Exam: 06/06/2022 Medical Rec #:  [id number]     Height:       67.5 in Accession #:    [id number]    Weight:       188.0 lb Date of Birth:  03/23/1946     BSA:          1.981 m Patient Age:    60 years       BP:           152/90 mmHg Patient Gender: F             HR:           75 bpm. Exam Location:  Inpatient Procedure: 2D Echo, Cardiac Doppler and Color Doppler Indications:    Congenital Heart Disease Q24.0  History:        Patient has prior history of Echocardiogram examinations.                 Cardiomyopathy, CAD, Prior CABG and Pacemaker, COPD,                 Arrythmias:Atrial Fibrillation; Risk Factors:Hypertension.  Sonographer:    Eartha Inch Referring Phys: [id number] AMY D CLEGG  Sonographer Comments: Image acquisition challenging due to patient body habitus and Image acquisition challenging due to respiratory motion. IMPRESSIONS  1. Left ventricular ejection fraction, by estimation, is 20%. The left ventricle has severely decreased function. The left ventricle demonstrates global hypokinesis. There is moderate concentric left ventricular hypertrophy. Left ventricular diastolic parameters are indeterminate.  2. Right ventricular systolic function is moderately reduced. The right ventricular size is normal. There is moderately elevated pulmonary artery systolic pressure. The estimated right ventricular systolic pressure is 26.8 mmHg.  3. Left atrial size was moderately dilated.  4. Right atrial size was moderately dilated.  5. The mitral valve is abnormal. Mild to moderate mitral valve regurgitation. No evidence of mitral stenosis.  6. The tricuspid valve is abnormal. Tricuspid valve regurgitation is moderate.  7. The aortic valve is tricuspid. There is mild calcification of the aortic valve. Aortic valve regurgitation is trivial. No aortic stenosis is present.  8. The inferior vena cava is dilated in size with <50% respiratory variability, suggesting right atrial pressure of 15 mmHg.  9. The patient is in atrial fibrillation. FINDINGS  Left Ventricle: Left ventricular ejection fraction, by estimation, is 20%. The left  ventricle has severely decreased function. The left ventricle demonstrates global  hypokinesis. The left ventricular internal cavity size was normal in size. There is moderate concentric left ventricular hypertrophy. Left ventricular diastolic parameters are indeterminate. Right Ventricle: The right ventricular size is normal. No increase in right ventricular wall thickness. Right ventricular systolic function is moderately reduced. There is moderately elevated pulmonary artery systolic pressure. The tricuspid regurgitant velocity is 2.79 m/s, and with an assumed right atrial pressure of 15 mmHg, the estimated right ventricular systolic pressure is 93.2 mmHg. Left Atrium: Left atrial size was moderately dilated. Right Atrium: Right atrial size was moderately dilated. Pericardium: There is no evidence of pericardial effusion. Mitral Valve: The mitral valve is abnormal. There is mild calcification of the mitral valve leaflet(s). Mild mitral annular calcification. Mild to moderate mitral valve regurgitation. No evidence of mitral valve stenosis. Tricuspid Valve: The tricuspid valve is abnormal. Tricuspid valve regurgitation is moderate. Aortic Valve: The aortic valve is tricuspid. There is mild calcification of the aortic valve. Aortic valve regurgitation is trivial. No aortic stenosis is present. Pulmonic Valve: The pulmonic valve was normal in structure. Pulmonic valve regurgitation is not visualized. Aorta: The aortic root is normal in size and structure. Venous: The inferior vena cava is dilated in size with less than 50% respiratory variability, suggesting right atrial pressure of 15 mmHg. IAS/Shunts: No atrial level shunt detected by color flow Doppler. Additional Comments: A device lead is visualized in the right ventricle.  LEFT VENTRICLE PLAX 2D LVIDd:         5.70 cm      Diastology LVIDs:         4.60 cm      LV e' medial:    4.28 cm/s LV PW:         0.90 cm      LV E/e' medial:  20.4 LV IVS:        0.90 cm      LV e' lateral:   11.50 cm/s LVOT diam:     2.00 cm      LV E/e' lateral: 7.6  LV SV:         41 LV SV Index:   21 LVOT Area:     3.14 cm  LV Volumes (MOD) LV vol d, MOD A2C: 139.0 ml LV vol d, MOD A4C: 128.0 ml LV vol s, MOD A2C: 119.0 ml LV vol s, MOD A4C: 92.3 ml LV SV MOD A2C:     20.0 ml LV SV MOD A4C:     128.0 ml LV SV MOD BP:      31.4 ml RIGHT VENTRICLE            IVC RV S prime:     7.54 cm/s  IVC diam: 2.20 cm TAPSE (M-mode): 1.5 cm LEFT ATRIUM           Index LA diam:      5.30 cm 2.68 cm/m LA Vol (A4C): 84.2 ml 42.51 ml/m  AORTIC VALVE LVOT Vmax:   109.00 cm/s LVOT Vmean:  68.300 cm/s LVOT VTI:    0.132 m  AORTA Ao Root diam: 2.50 cm MITRAL VALVE               TRICUSPID VALVE MV Area (PHT): 4.12 cm    TR Peak grad:   31.1 mmHg MV Decel Time: 184 msec    TR Mean grad:   21.0 mmHg MR Peak grad: 111.1 mmHg   TR Vmax:  279.00 cm/s MR Mean grad: 75.0 mmHg    TR Vmean:       222.0 cm/s MR Vmax:      527.00 cm/s MR Vmean:     414.0 cm/s   SHUNTS MV E velocity: 87.50 cm/s  Systemic VTI:  0.13 m                            Systemic Diam: 2.00 cm Dalton McleanMD Electronically signed by Franki Monte Signature Date/Time: 06/06/2022/3:51:09 PM    Final    [place] EKG SITE RITE  Result Date: 06/06/2022 If Site Rite image not attached, placement could not be confirmed due to current cardiac rhythm.  DG Chest Port 1 View  Result Date: 06/06/2022 CLINICAL DATA:  Shortness of breath EXAM: PORTABLE CHEST 1 VIEW COMPARISON:  05/03/2022 FINDINGS: Cardiomegaly. Upper mediastinal widening towards the right is from ectatic vessels by prior CT. Left atrial clipping. Pacer lead into the right ventricle. Mild interstitial coarsening without Kerley lines or effusion. No air bronchogram. IMPRESSION: Cardiomegaly and borderline vascular congestion. Electronically Signed   By: Jorje Guild M.D.   On: 06/06/2022 06:25    Assessment & Plan:   [place] was seen today for annual exam, diabetes and congestive heart failure.  Diagnoses and all orders for this visit:  Controlled type 2  diabetes mellitus without complication, without long-term current use of insulin ([place])- Her blood sugar is well controlled.  Thrombocytopenia ([place])- Her platelets are stable and there is no history of bleeding or bruising.  Encounter for general adult medical examination with abnormal findings- Exam completed, labs reviewed, vaccines reviewed and updated, no cancer screenings indicated, patient education was given.  Flu vaccine need -     Flu Vaccine QUAD High Dose([place])  Ceruminosis, bilateral -     Ambulatory referral to ENT  Stage 3a chronic kidney disease ([place])- Her renal function is stable.  Other orders -     Pneumococcal polysaccharide vaccine 23-valent greater than or equal to 2yo subcutaneous/IM   I am having Laityn T. [place] maintain her rivaroxaban, metFORMIN, acetaminophen, allopurinol, losartan, Trelegy Ellipta, atorvastatin, ipratropium-albuterol, albuterol, pantoprazole, carboxymethylcellulose, spironolactone, torsemide, isosorbide mononitrate, and hydrALAZINE.  No orders of the defined types were placed in this encounter.    Follow-up: Return in about 4 months (around 10/15/2022).  Scarlette Calico, MD

## 2022-06-17 ENCOUNTER — Other Ambulatory Visit (HOSPITAL_COMMUNITY): Payer: Self-pay | Admitting: Emergency Medicine

## 2022-06-17 DIAGNOSIS — H6123 Impacted cerumen, bilateral: Secondary | ICD-10-CM | POA: Insufficient documentation

## 2022-06-17 DIAGNOSIS — Z23 Encounter for immunization: Secondary | ICD-10-CM | POA: Insufficient documentation

## 2022-06-17 DIAGNOSIS — I5042 Chronic combined systolic (congestive) and diastolic (congestive) heart failure: Secondary | ICD-10-CM | POA: Insufficient documentation

## 2022-06-17 NOTE — Assessment & Plan Note (Signed)
She is euvolemic. 

## 2022-06-17 NOTE — Progress Notes (Unsigned)
SOCIAL/MEDICAL BARRIERS:  PHARMACY USED [facility]   PCP Dr. Scarlette Calico   INSURANCE***  MED ISSUES:  AFFORDABILITY none   PT ASSIST APPS NEEDED NONE  SOURCE OF INCOME Pension, SSI  TRANSPORTATION yes  FOOD INSECURITIES/NEEDS NONE FOOD STAMPS NO  REVIEWED DIET/FLUID/SALT RESTRICTIONSYES  RENT/OWN HOME ISSUES OWN  SOCIAL SUPPORT FAMILY  SAFETY/DOMESTIC ISSUES NONE  SUBSTANCE ABUSE NONE  DAILY WEIGHTS SCALE SUPPLIED  EDUCATE ON DISEASE PROCESS/SYMPTOMS/PURPOSES OF MEDSYES

## 2022-06-22 ENCOUNTER — Ambulatory Visit (HOSPITAL_COMMUNITY)
Admit: 2022-06-22 | Discharge: 2022-06-22 | Disposition: A | Discharge status: Home / Self Care | Payer: HMO | Attending: Physician Assistant | Admitting: Physician Assistant

## 2022-06-22 ENCOUNTER — Encounter (HOSPITAL_COMMUNITY): Payer: Self-pay

## 2022-06-22 VITALS — BP 110/60 | HR 70 | Wt 183.0 lb

## 2022-06-22 DIAGNOSIS — Z91148 Patient's other noncompliance with medication regimen for other reason: Secondary | ICD-10-CM | POA: Diagnosis not present

## 2022-06-22 DIAGNOSIS — Z7901 Long term (current) use of anticoagulants: Secondary | ICD-10-CM | POA: Diagnosis not present

## 2022-06-22 DIAGNOSIS — I4821 Permanent atrial fibrillation: Secondary | ICD-10-CM | POA: Diagnosis not present

## 2022-06-22 DIAGNOSIS — I2510 Atherosclerotic heart disease of native coronary artery without angina pectoris: Secondary | ICD-10-CM | POA: Diagnosis not present

## 2022-06-22 DIAGNOSIS — I340 Nonrheumatic mitral (valve) insufficiency: Secondary | ICD-10-CM

## 2022-06-22 DIAGNOSIS — Z87891 Personal history of nicotine dependence: Secondary | ICD-10-CM | POA: Insufficient documentation

## 2022-06-22 DIAGNOSIS — I5042 Chronic combined systolic (congestive) and diastolic (congestive) heart failure: Secondary | ICD-10-CM | POA: Diagnosis present

## 2022-06-22 DIAGNOSIS — I5022 Chronic systolic (congestive) heart failure: Secondary | ICD-10-CM

## 2022-06-22 DIAGNOSIS — E1122 Type 2 diabetes mellitus with diabetic chronic kidney disease: Secondary | ICD-10-CM | POA: Diagnosis not present

## 2022-06-22 DIAGNOSIS — Z79899 Other long term (current) drug therapy: Secondary | ICD-10-CM | POA: Diagnosis not present

## 2022-06-22 DIAGNOSIS — J441 Chronic obstructive pulmonary disease with (acute) exacerbation: Secondary | ICD-10-CM | POA: Insufficient documentation

## 2022-06-22 DIAGNOSIS — N1831 Chronic kidney disease, stage 3a: Secondary | ICD-10-CM | POA: Insufficient documentation

## 2022-06-22 DIAGNOSIS — I493 Ventricular premature depolarization: Secondary | ICD-10-CM | POA: Diagnosis not present

## 2022-06-22 DIAGNOSIS — Z720 Tobacco use: Secondary | ICD-10-CM

## 2022-06-22 DIAGNOSIS — I130 Hypertensive heart and chronic kidney disease with heart failure and stage 1 through stage 4 chronic kidney disease, or unspecified chronic kidney disease: Secondary | ICD-10-CM | POA: Insufficient documentation

## 2022-06-22 DIAGNOSIS — N1832 Chronic kidney disease, stage 3b: Secondary | ICD-10-CM

## 2022-06-22 DIAGNOSIS — I10 Essential (primary) hypertension: Secondary | ICD-10-CM

## 2022-06-22 DIAGNOSIS — I4819 Other persistent atrial fibrillation: Secondary | ICD-10-CM | POA: Diagnosis not present

## 2022-06-22 LAB — BASIC METABOLIC PANEL
Anion gap: 9 (ref 5–15)
BUN: 25 mg/dL — ABNORMAL HIGH (ref 8–23)
CO2: 19 mmol/L — ABNORMAL LOW (ref 22–32)
Calcium: 9.1 mg/dL (ref 8.9–10.3)
Chloride: 111 mmol/L (ref 98–111)
Creatinine, Ser: 1.37 mg/dL — ABNORMAL HIGH (ref 0.44–1.00)
GFR, Estimated: 40 mL/min — ABNORMAL LOW (ref >60)
Glucose, Bld: 108 mg/dL — ABNORMAL HIGH (ref 70–99)
Potassium: 3.9 mmol/L (ref 3.5–5.1)
Sodium: 139 mmol/L (ref 135–145)

## 2022-06-22 LAB — CBC
HCT: 49.7 % — ABNORMAL HIGH (ref 36.0–46.0)
Hemoglobin: 16.2 g/dL — ABNORMAL HIGH (ref 12.0–15.0)
MCH: 28.6 pg (ref 26.0–34.0)
MCHC: 32.6 g/dL (ref 30.0–36.0)
MCV: 87.8 fL (ref 80.0–100.0)
Platelets: 160 10*3/uL (ref 150–400)
RBC: 5.66 MIL/uL — ABNORMAL HIGH (ref 3.87–5.11)
RDW: 16.4 % — ABNORMAL HIGH (ref 11.5–15.5)
WBC: 7.1 10*3/uL (ref 4.0–10.5)
nRBC: 0 % (ref 0.0–0.2)

## 2022-06-22 LAB — MAGNESIUM: Magnesium: 1.9 mg/dL (ref 1.7–2.4)

## 2022-06-22 NOTE — Patient Instructions (Signed)
It was great to see you today! No medication changes are needed at this time.  Labs today We will only contact you if something comes back abnormal or we need to make some changes. Otherwise no news is good news!  Your physician recommends that you schedule a follow-up appointment in: 3 months  in the [facility] Pract[facility] ([facility]/NP) [facility] and in 6 months with Dr Haroldine Laws  Do the following things EVERYDAY: Weigh yourself in the morning before breakfast. Write it down and keep it in a log. Take your medicines as prescribed Eat low salt foods--Limit salt (sodium) to 2000 mg per day.  Stay as active as you can everyday Limit all fluids for the day to less than 2 liters   At the [facility], you and your health needs are our priority. As part of our continuing mission to provide you with exceptional heart care, we have created designated Provider Care Teams. These Care Teams include your primary Cardiologist (physician) and Advanced Practice Providers (APPs- Physician Assistants and Nurse Practitioners) who all work together to provide you with the care you need, when you need it.   You may see any of the following providers on your designated Care Team at your next follow up: Dr [name] Dr Loralie Champagne Dr. Roxana Hires, NP Lyda Jester, [place] [facility] [place], [place] Forestine Na, NP Audry Riles, PharmD   Please be sure to bring in all your medications bottles to every appointment.   If you have any questions or concerns before your next appointment please send [place] a message through [employer] or call our office at [phone].    TO LEAVE A MESSAGE FOR THE NURSE SELECT OPTION 2, PLEASE LEAVE A MESSAGE INCLUDING: YOUR NAME DATE OF BIRTH CALL BACK NUMBER REASON FOR CALL**this is important as we prioritize the call backs  YOU WILL RECEIVE A CALL BACK THE SAME DAY AS LONG AS YOU CALL BEFORE 4:00 PM

## 2022-06-22 NOTE — Progress Notes (Signed)
Advanced Heart Failure Clinic Note  PCP: Janith Lima, MD Primary Cardiologist: [name]  EP: Dr Lovena Le HF MD: Dr Haroldine Laws   HPI: [name] [name] is a 76 y.o. female with h/o HTN, former tobacco abuse, permanent AF, CAD s/p CABG x 2 (LIMA to LAD and SVG to OM) in 2016, Chronic combined HF, Ischemic CM, HLD, and Biotronik PPM.   She was initially noted to be in atrial fibrillation in 2016 with a reduced LVEF, leading to ischemic workup with a cardiac cath showing severe multivessel CAD with LM dz -> CABG x2 utilizing LIMA to LAD, SVG to OM.   Admitted 1/19 for AF with RVR in setting of Influenza A. EP was consulted to discuss plans for Tikosyn vs. ablation vs. medication rate control however, due to her known medication noncompliance she was found to not be an ablation nor antiarrhythmic candidate   2019 back in Afib. She was started on amiodarone & Entresto.  She underwent successful atrial flutter TEE/DCCV on 12/25/17.  Later went back in A fib.   Admitted 3/21 for iron-def anemia. Hgb 5.4 EGD/colonoscopy which were unremarkable and capsule endoscopy which showed duodenal ulcer with bleeding and stigmata.  GI recommended oral Protonix and outpatient follow-up.  Admitted 6/22 for AV node ablation and PPM due to uncontrolled heart rates with A fib. Underwent Biotronix single lead PPM. Toprol XL and digoxin stopped. Carvedilol 25 mg bid started. She was discharged 01/19/21.    Admitted 9/62/83 with a/c systolic heart failure. Diuresed with IV lasix and transitioned to torsemide 40 mg daily. EF 25-30% with severe MR/TR.    Admitted 10/22 for COPD exacerbation with a component of A/C CHF, ran out of torsemide several days prior  Echo 07/30/21: EF 25-30% Moderate RV dysfunction. Severe biatrial enlargement. Severe central MR/TR.   Was seen at [facility] in 4/23 for second opinion. Restarted Entresto 24/26 bid. Did not tolerate due to CP. Agreed she might candidate for  mTEER down the road.   She was seen in the ED  June, July, August, and twice in September 2023 for chest pain/shortness of breath.    Followed by EP for device upgrade. She was hesitant to pursue any procedures. Possible CRT discussed.   Admitted 10/23 with a/c HF, worrisome for low output. Echo showed EF 20%, severe LV dysfunction, RV moderately down.  Diuresed with IV lasix and started on milrinone. RHC showed low volume and CI 1.9 on milrinone, improved with small IVF bolus. Drips weaned and GDMT added back. BiDil increased with elevated BP. Referred back to Paramedicine to help with meds. Discharged home, weight 183 lbs.  Today she returns for HF follow up. Overall feeling fine. SOB walking up stairs, breathing better since discharge. Occasional dizziness, no falls. Denies CP, dizziness, edema, or PND/Orthopnea. Appetite ok. No fever or chills. Weight at home 179 pounds. Taking all medications. Smokes 1-1.5 cigs/day. Followed by paramedicine.   Cardiac Studies    - RHC (10/23): on milrinone 0.25 RA = 1 RV = 27/1 PA = 32/13 (18) PCW = 2 Fick cardiac output/index = 5.3/2.7 Thermo = 3.8/1.9 PVR = 3.0 (fick) 4.2 (TD) Ao sat = 93% PA sat = 71%, 68%   Given 175 cc NS load   PCW = 5 TD CO/CI = 4.1/2.1   1. Low volume status 2. With mild to moderately depressed CO  on milrinone 3. Mild improvement in hemodynamics with volume loading    - Echo (10/23): EF 20%, severe LV dysfunction, RV moderately down  - Echo (6/22): EF 25-30% RV severely reduced  - Echo (4/22): EF 25-30% RV moderately reduced   - Echo (11/21): EF 30-35% RV normal   - Echo (4/21): EF 55-60%   - Stress Test (1/20): Low Risk EF 44%  - Echo (12/19): EF 30-35% RV mildly dilated.  - Echo (3/19): EF 30-35%, moderate LVH, Mild AI, Moderate regurgitation, RV moderately down, PA peak pressure 50 mm Hg.   - Echo (5/18): EF stable 50-55%  - Echo (10/17): EF improved to 50-55%.  - Echo (3/16): EF 35-40%.  ROS:  All systems negative except as listed in HPI, PMH and Problem List.  SH:  Social History   Socioeconomic History   Marital status: Widowed    Spouse name: Not on file   Number of children: 1   Years of education: 52   Highest education level: Not on file  Occupational History   Occupation: RETIRED [employer]  Tobacco Use   Smoking status: Former    Packs/day: 0.50    Years: 56.00    Total pack years: 28.00    Types: Cigarettes    Quit date: 11/13/2021    Years since quitting: 0.6   Smokeless tobacco: Never   Tobacco comments:    smoking 2 cigarettes/day as of 10/23/20  Vaping Use   Vaping Use: Never used  Substance and Sexual Activity   Alcohol use: No    Alcohol/week: 0.0 standard drinks of alcohol   Drug use: No   Sexual activity: Not on file  Other Topics Concern   Not on file  Social History Narrative   Patient reports it being difficult to pay for everything due to outstanding medical bills from hospital stay last year but states she is able to keep up with basic expenses.  Patient does have some concerns with her house's condition due to a water leak- had her roof repaired last month but has some leaking in the front which has affected her porch.  Patient owns her own car and is able to drive herself but has some concerns about the reliability of her car- has gotten taxi to the clinic for appointment in the past when her car broke down.   Social Determinants of Health   Financial Resource Strain: Low Risk  (06/07/2018)   Overall Financial Resource Strain (CARDIA)    Difficulty of Paying Living Expenses: Not hard at all  Recent Concern: Financial Resource Strain - Medium Risk (05/21/2018)   Overall Financial Resource Strain (CARDIA)    Difficulty of Paying Living Expenses: Somewhat hard  Food Insecurity: No Food Insecurity (06/09/2022)   Hunger Vital Sign    Worried About Running Out of Food in the Last Year: Never true    [place] in the Last Year:  Never true  Transportation Needs: No Transportation Needs (06/09/2022)   PRAPARE - Hydrologist (Medical): No    Lack of Transportation (Non-Medical): No  Physical Activity: Not on file  Stress: Not on file  Social Connections: Not on file  Intimate Partner Violence: Not At Risk (06/09/2022)   Humiliation, Afraid, Rape, and Kick questionnaire    Fear of Current or Ex-Partner: No    Emotionally Abused: No    Physically Abused: No    Sexually Abused: No   FH:  Family History  Problem Relation Age of Onset   Cancer Mother        LYMPHOMA   Heart disease Father    Other Father        TB   CVA Sister    Prostate cancer Brother 5   Diabetes Brother    Past Medical History:  Diagnosis Date   (HFimpEF) heart failure with improved ejection fraction ([place])    a. 07/2018 Echo: EF 30-35%; b. 11/2019 Echo: EF 55-60%, no rwma, Gr2 DD, Nl RV size/fxn. Mild BAE. Mild MR/AI.   Arthritis    Asthma    Atopic dermatitis    CAD (coronary artery disease)    a. 2016 s/p CABG x 2 (LIMA->LAD, VG->OM); b. 08/2018 MV: EF 44%, no ischemia/infact.   Cardiac arrest ([place]) 10/2014   Cardiomyopathy, ischemic    a. 07/2018 Echo: EF 30-35%; 11/2019 Echo: EF 55-60%.   Carotid arterial disease ([place])    a. 11/2019 Carotid U/S   CHF (congestive heart failure) (HCC)    CKD (chronic kidney disease), stage III (HCC)    COPD (chronic obstructive pulmonary disease) (HCC)    Diabetes mellitus without complication ([place])    Dysrhythmia    Esophageal dilatation 2013   GERD (gastroesophageal reflux disease)    Gout    Headache    Hypertension    Hypokalemia    Idiopathic angioedema    LGI bleed 08/06/2017   a. felt to be hemorrhoidal during that admission (no drop in Hgb).   Lower back pain    Paroxysmal atrial fibrillation (HCC)    a. Dx 2016-->h/o difficult to control rates (complicated by noncompliance), not felt to be a candidate for ablation or antiarrhythmic due to  noncompliance; b. Recurrent AF 2021 - converted w/ IV dilt; c. CHA2DS2VASc = 7-->Xarelto.   Personal history of noncompliance with medical treatment, presenting hazards to health    Prediabetes    Presence of permanent cardiac pacemaker    S/P CABG x 2 with clipping of LA appendage 11/14/2014   LIMA to LAD, SVG to OM, EVH via right thigh   Urine incontinence    Uterine fibroid    Current Outpatient Medications  Medication Sig Dispense Refill   acetaminophen (TYLENOL) 325 MG tablet Take 2 tablets (650 mg total) by mouth every 4 (four) hours as needed for headache or mild pain.     albuterol (VENTOLIN HFA) 108 (90 Base) MCG/ACT inhaler Inhale 1-2 puffs into the lungs every 6 (six) hours as needed for wheezing or shortness of breath. 18 g 2   allopurinol (ZYLOPRIM) 100 MG tablet Take 100 mg by mouth daily as needed (gout symptoms).     atorvastatin (LIPITOR) 10 MG tablet Take 1 tablet (10 mg total) by mouth at bedtime. 90 tablet 1   carboxymethylcellulose (REFRESH PLUS) 0.5 % SOLN Place 1 drop into both eyes daily as needed (dry eyes).     Fluticasone-Umeclidin-Vilant (TRELEGY ELLIPTA) 100-62.5-25 MCG/ACT AEPB Inhale 1 puff into the lungs daily. 120 each 1   hydrALAZINE (APRESOLINE) 25 MG tablet Take 1 tablet (25 mg total) by mouth 3 (three) times daily. 90 tablet 0   ipratropium-albuterol (DUONEB) 0.5-2.5 (3) MG/3ML SOLN Take 3 mLs by nebulization every 6 (six) hours as needed. (Patient taking differently: Take 3 mLs by nebulization every 6 (six) hours as needed (shortness of breath).) 360 mL 1   isosorbide mononitrate (IMDUR) 30 MG 24 hr tablet Take 1 tablet (30 mg total) by mouth daily. 30 tablet 0  losartan (COZAAR) 50 MG tablet Take 1 tablet (50 mg total) by mouth daily. 30 tablet 6   metFORMIN (GLUCOPHAGE) 500 MG tablet Take 500 mg by mouth daily with breakfast. Daily with a meal     pantoprazole (PROTONIX) 40 MG tablet Take 1 tablet (40 mg total) by mouth daily. 30 tablet 2   rivaroxaban  (XARELTO) 20 MG TABS tablet Take 1 tablet (20 mg total) by mouth daily with supper. Resume from 11/01/2019     spironolactone (ALDACTONE) 25 MG tablet Take 0.5 tablets (12.5 mg total) by mouth daily.     torsemide (DEMADEX) 20 MG tablet Take 1 tablet (20 mg total) by mouth daily. 30 tablet 1   No current facility-administered medications for this encounter.   BP 110/60   Pulse 70   Wt 83 kg (183 lb)   SpO2 97%   BMI 28.66 kg/m   Wt Readings from Last 3 Encounters:  06/22/22 83 kg (183 lb)  06/17/22 82.7 kg (182 lb 6.4 oz)  06/16/22 83.9 kg (185 lb)   PHYSICAL EXAM: General:  NAD. No resp difficulty, walked into clinic HEENT: Normal Neck: Supple. No JVD. Carotids 2+ bilat; no bruits. No lymphadenopathy or thryomegaly appreciated. Cor: PMI nondisplaced. Irregular rate & rhythm. No rubs, gallops or murmurs. Lungs: Diminished upper lobes. Abdomen: Soft, nontender, nondistended. No hepatosplenomegaly. No bruits or masses. Good bowel sounds. Extremities: No cyanosis, clubbing, rash, edema Neuro: Alert & oriented x 3, cranial nerves grossly intact. Moves all 4 extremities w/o difficulty. Affect pleasant.  ECG (personally reviewed): atrial fibrillation w/ PVC, v-pacing some. 74 bpm  ASSESSMENT & PLAN: 1. Chronic Systolic Heart Failure - Suspect Combined ICM /NICM with previous A fib RVR. RV Pacing? - In 2021 EF improved 55-60% but over the last year EF has been down 25-30 % with severe MR/TR - Echo (10/23): EF 20%, moderate LVH, RV moderately reduced, RVSP 46 mmHg, moderate BAE, mild to moderate MR, moderate TR, - Previously discussed upgrading device to CRT d/t high burden RV pacing. She has been hesitant to proceed.  - Improved NYHA II, volume OK today. - Continue torsemide 20 mg daily. - Continue spironolactone 12.5 mg daily. - Continue losartan 50 mg daily (failed Entresto with chest pain). - Continue hydralazine 25 mg tid + Imdur 30 mg daily. - No SGLT2i due UTI/yeast infections.   - Labs today.  2. MR, Previously Severe - Mild to moderate on echo this admit.   3. Permanent A fib  - Biotronik dual chamber PPM/AV nodal ablation 2022.  - Followed by EP for possible upgrade to CRT d/t high burden of RV pacing but she has not wanted to pursue. See above. - Rate controlled.  - Continue Xarelto 20 mg daily.   4. CAD - s/pCABG x2 2016 (LIMA to LAD and SVG to OM)  - No chest pain.  - Continue statin.   5. COPD - Discussed smoking cessation. - Smokes 1-1.5 cigs/day.   6. PVCs - During admit, on milrinone. - Several on ECG today. - Labs today.  7. CKD IIIa - baseline SCr 1.2-1.4 - Labs today.  8. Hypertension - Better controlled. - GDMT as above.   Continue HF Paramedicine for assistance with medications. She is at high risk for readmission.  Follow up in 3 months with APP and 6 months with [name].  Allena Katz, FNP-BC 06/22/22

## 2022-06-24 ENCOUNTER — Telehealth (HOSPITAL_COMMUNITY): Payer: Self-pay | Admitting: Emergency Medicine

## 2022-06-24 ENCOUNTER — Other Ambulatory Visit (HOSPITAL_COMMUNITY): Payer: Self-pay | Admitting: Emergency Medicine

## 2022-06-24 NOTE — Telephone Encounter (Signed)
Ms Vannatter called me back and stated that someone had already come out to see her today but she could not say who that was.  I told her it wasn't me and asked if I could still come by and see her and she said she was out shopping.   I asked her if she had gotten the message from the Heart Failure clinic and she said no.  I advised her that her last labs showed her kidney function mildly elevated.  She is to not take her torsemide for the next two days (Friday & Saturday) and resume taking her torsemide on Sunday as she normally would.  She stated she understood the instructions.  I advised her that I will call her back to schedule another visit next week.    [name], [place] 06/24/2022

## 2022-06-24 NOTE — Telephone Encounter (Signed)
Called [name] today @ 1:30 and 1:40  on her home and mobile numbers.  Left voicemail for her to call me back regarding her home visit today.    [name], [place] 06/24/2022

## 2022-06-24 NOTE — Progress Notes (Signed)
Went by Ms. Cardozo's home for visit and she was not there.      [name], [place] 06/24/2022

## 2022-06-27 ENCOUNTER — Other Ambulatory Visit: Payer: Self-pay | Admitting: Internal Medicine

## 2022-06-27 DIAGNOSIS — J411 Mucopurulent chronic bronchitis: Secondary | ICD-10-CM

## 2022-06-28 ENCOUNTER — Encounter (HOSPITAL_COMMUNITY): Payer: Self-pay

## 2022-06-30 ENCOUNTER — Telehealth (HOSPITAL_COMMUNITY): Payer: Self-pay | Admitting: Emergency Medicine

## 2022-06-30 ENCOUNTER — Ambulatory Visit: Payer: HMO | Attending: Internal Medicine | Admitting: Internal Medicine

## 2022-06-30 VITALS — BP 130/76 | HR 76 | Wt 185.0 lb

## 2022-06-30 DIAGNOSIS — E1122 Type 2 diabetes mellitus with diabetic chronic kidney disease: Secondary | ICD-10-CM

## 2022-06-30 DIAGNOSIS — N179 Acute kidney failure, unspecified: Secondary | ICD-10-CM

## 2022-06-30 DIAGNOSIS — I10 Essential (primary) hypertension: Secondary | ICD-10-CM

## 2022-06-30 DIAGNOSIS — I5042 Chronic combined systolic (congestive) and diastolic (congestive) heart failure: Secondary | ICD-10-CM

## 2022-06-30 DIAGNOSIS — I083 Combined rheumatic disorders of mitral, aortic and tricuspid valves: Secondary | ICD-10-CM

## 2022-06-30 DIAGNOSIS — D696 Thrombocytopenia, unspecified: Secondary | ICD-10-CM

## 2022-06-30 DIAGNOSIS — I2583 Coronary atherosclerosis due to lipid rich plaque: Secondary | ICD-10-CM

## 2022-06-30 DIAGNOSIS — I130 Hypertensive heart and chronic kidney disease with heart failure and stage 1 through stage 4 chronic kidney disease, or unspecified chronic kidney disease: Secondary | ICD-10-CM

## 2022-06-30 DIAGNOSIS — Z7984 Long term (current) use of oral hypoglycemic drugs: Secondary | ICD-10-CM

## 2022-06-30 DIAGNOSIS — I480 Paroxysmal atrial fibrillation: Secondary | ICD-10-CM

## 2022-06-30 DIAGNOSIS — J449 Chronic obstructive pulmonary disease, unspecified: Secondary | ICD-10-CM

## 2022-06-30 DIAGNOSIS — I2510 Atherosclerotic heart disease of native coronary artery without angina pectoris: Secondary | ICD-10-CM

## 2022-06-30 DIAGNOSIS — Z950 Presence of cardiac pacemaker: Secondary | ICD-10-CM

## 2022-06-30 DIAGNOSIS — D509 Iron deficiency anemia, unspecified: Secondary | ICD-10-CM

## 2022-06-30 DIAGNOSIS — Z7951 Long term (current) use of inhaled steroids: Secondary | ICD-10-CM

## 2022-06-30 DIAGNOSIS — N1831 Chronic kidney disease, stage 3a: Secondary | ICD-10-CM

## 2022-06-30 DIAGNOSIS — E785 Hyperlipidemia, unspecified: Secondary | ICD-10-CM

## 2022-06-30 DIAGNOSIS — Z7901 Long term (current) use of anticoagulants: Secondary | ICD-10-CM

## 2022-06-30 DIAGNOSIS — I4821 Permanent atrial fibrillation: Secondary | ICD-10-CM

## 2022-06-30 DIAGNOSIS — D631 Anemia in chronic kidney disease: Secondary | ICD-10-CM

## 2022-06-30 DIAGNOSIS — Z951 Presence of aortocoronary bypass graft: Secondary | ICD-10-CM

## 2022-06-30 DIAGNOSIS — I5022 Chronic systolic (congestive) heart failure: Secondary | ICD-10-CM

## 2022-06-30 DIAGNOSIS — Z9181 History of falling: Secondary | ICD-10-CM

## 2022-06-30 NOTE — Patient Instructions (Addendum)
Medication Instructions:  Your physician recommends that you continue on your current medications as directed. Please refer to the Current Medication list given to you today.  *If you need a refill on your cardiac medications before your next appointment, please call your pharmacy*  Lab Work: None ordered.  If you have labs (blood work) drawn today and your tests are completely normal, you will receive your results only by: [place] (if you have MyChart) OR A paper copy in the mail If you have any lab test that is abnormal or we need to change your treatment, we will call you to review the results.  Testing/Procedures: None ordered.  Follow-Up:  Dr [name] wants to upgrade your device to a BiV ICD. The days available in December are:  December:  4, 13, 18  Please call [phone] or MyChart message [place] with your Month and Day.    See ICD Booklet

## 2022-06-30 NOTE — Telephone Encounter (Signed)
Attempted to call Lalita and her mailbox is full and could not leave a message.  Wanted to schedule another home visit.  Will try her again later.    [name], [place] 06/30/2022

## 2022-06-30 NOTE — Progress Notes (Signed)
HPI [name] returns today for followup. She is a pleasant 76 yo woman with a h/o HTN and atrial fib with an uncontrolled VR. She developed a tachy induced CM and underwent AV node ablation and insertion of a left bundle area single chamber PPM. She has had progressive sob and worsening LV function and CHF symptoms. She is thought to have permanent atrial fib. She denies chest pain but has class 3 dyspnea. She has been treated with maximal medical therapy under the direction of Dr. Reine [name]. Her EF is 25%.  Allergies  Allergen Reactions   Bee Venom Anaphylaxis   Ivp Dye [Iodinated Contrast Media] Anaphylaxis   Ace Inhibitors     Other reaction(s): angioedema   Atenolol     Other reaction(s): severe headaches   Codeine Nausea And Vomiting   Entresto [Sacubitril-Valsartan] Other (See Comments)    Chest pain    Penicillin G     Other reaction(s): rash   Shrimp [Shellfish Allergy] Swelling   Simvastatin     Other reaction(s): not sure   Jardiance [Empagliflozin] Other (See Comments)    Caused Boils      Current Outpatient Medications  Medication Sig Dispense Refill   acetaminophen (TYLENOL) 325 MG tablet Take 2 tablets (650 mg total) by mouth every 4 (four) hours as needed for headache or mild pain.     albuterol (VENTOLIN HFA) 108 (90 Base) MCG/ACT inhaler Inhale 1-2 puffs into the lungs every 6 (six) hours as needed for wheezing or shortness of breath. 18 g 2   allopurinol (ZYLOPRIM) 100 MG tablet Take 100 mg by mouth daily as needed (gout symptoms).     atorvastatin (LIPITOR) 10 MG tablet Take 1 tablet (10 mg total) by mouth at bedtime. 90 tablet 1   carboxymethylcellulose (REFRESH PLUS) 0.5 % SOLN Place 1 drop into both eyes daily as needed (dry eyes).     hydrALAZINE (APRESOLINE) 25 MG tablet Take 1 tablet (25 mg total) by mouth 3 (three) times daily. 90 tablet 0   isosorbide mononitrate (IMDUR) 30 MG 24 hr tablet Take 1 tablet (30 mg total) by mouth daily. 30 tablet 0    losartan (COZAAR) 50 MG tablet Take 1 tablet (50 mg total) by mouth daily. 30 tablet 6   metFORMIN (GLUCOPHAGE) 500 MG tablet Take 500 mg by mouth daily with breakfast. Daily with a meal     pantoprazole (PROTONIX) 40 MG tablet Take 1 tablet (40 mg total) by mouth daily. 30 tablet 2   rivaroxaban (XARELTO) 20 MG TABS tablet Take 1 tablet (20 mg total) by mouth daily with supper. Resume from 11/01/2019     spironolactone (ALDACTONE) 25 MG tablet Take 0.5 tablets (12.5 mg total) by mouth daily.     torsemide (DEMADEX) 20 MG tablet Take 1 tablet (20 mg total) by mouth daily. 30 tablet 1   TRELEGY ELLIPTA 100-62.5-25 MCG/ACT AEPB Inhale ONE PUFF into THE lungs daily 120 each 1   No current facility-administered medications for this visit.     Past Medical History:  Diagnosis Date   ([place]) heart failure with improved ejection fraction ([place])    a. 07/2018 Echo: EF 30-35%; b. 11/2019 Echo: EF 55-60%, no rwma, Gr2 DD, Nl RV size/fxn. Mild BAE. Mild MR/AI.   Arthritis    Asthma    Atopic dermatitis    CAD (coronary artery disease)    a. 2016 s/p CABG x 2 (LIMA->LAD, VG->OM); b. 08/2018 MV: EF 44%, no ischemia/infact.  Cardiac arrest ([place]) 10/2014   Cardiomyopathy, ischemic    a. 07/2018 Echo: EF 30-35%; 11/2019 Echo: EF 55-60%.   Carotid arterial disease ([place])    a. 11/2019 Carotid U/S   CHF (congestive heart failure) (HCC)    CKD (chronic kidney disease), stage III (HCC)    COPD (chronic obstructive pulmonary disease) (HCC)    Diabetes mellitus without complication ([place])    Dysrhythmia    Esophageal dilatation 2013   GERD (gastroesophageal reflux disease)    Gout    Headache    Hypertension    Hypokalemia    Idiopathic angioedema    LGI bleed 08/06/2017   a. felt to be hemorrhoidal during that admission (no drop in Hgb).   Lower back pain    Paroxysmal atrial fibrillation (HCC)    a. Dx 2016-->h/o difficult to control rates (complicated by noncompliance), not felt to be a candidate  for ablation or antiarrhythmic due to noncompliance; b. Recurrent AF 2021 - converted w/ IV dilt; c. CHA2DS2VASc = 7-->Xarelto.   Personal history of noncompliance with medical treatment, presenting hazards to health    Prediabetes    Presence of permanent cardiac pacemaker    S/P CABG x 2 with clipping of LA appendage 11/14/2014   LIMA to LAD, SVG to OM, EVH via right thigh   Urine incontinence    Uterine fibroid     ROS:   All systems reviewed and negative except as noted in the HPI.   Past Surgical History:  Procedure Laterality Date   AV NODE ABLATION N/A 01/18/2021   Procedure: AV NODE ABLATION;  Surgeon: Evans Lance, MD;  Location: [place] [facility];  Service: Cardiovascular;  Laterality: N/A;   BALLOON DILATION N/A 10/10/2019   Procedure: BALLOON DILATION;  Surgeon: Otis Brace, MD;  [facility];  Service: Gastroenterology;  Laterality: N/A;   BIOPSY  10/10/2019   Procedure: BIOPSY;  Surgeon: Otis Brace, MD;  Location: [place];  Service: Gastroenterology;;   BIOPSY  10/11/2019   Procedure: BIOPSY;  Surgeon: Otis Brace, MD;  Location: [place];  Service: Gastroenterology;;   CARDIOVERSION N/A 11/18/2014   Procedure: CARDIOVERSION;  Surgeon: [name], MD;  Location: [place];  Service: Cardiovascular;  Laterality: N/A;   CARDIOVERSION N/A 12/25/2017   Procedure: CARDIOVERSION;  Surgeon: [name], MD;  Location: [place];  Service: Cardiovascular;  Laterality: N/A;   CLIPPING OF ATRIAL APPENDAGE N/A 11/14/2014   Procedure: CLIPPING OF ATRIAL APPENDAGE;  Surgeon: [name], MD;  Location: [place];  Service: Open Heart Surgery;  Laterality: N/A;   COLONOSCOPY  2013   COLONOSCOPY WITH PROPOFOL N/A 10/11/2019   Procedure: COLONOSCOPY WITH PROPOFOL;  Surgeon: Otis Brace, MD;  Location: [place];  Service: Gastroenterology;  Laterality: N/A;   CORONARY ARTERY BYPASS GRAFT N/A 11/14/2014   Procedure:  CORONARY ARTERY BYPASS GRAFTING (CABG)TIMES 2 USING LEFT INTERNAL MAMMARY ARTERY AND RIGHT SAPHENOUS VEIN HARVESTED ENDOSCOPICALLY;  Surgeon: [name], MD;  Location: [place];  Service: Open Heart Surgery;  Laterality: N/A;   ESOPHAGOGASTRODUODENOSCOPY (EGD) WITH PROPOFOL N/A 10/10/2019   Procedure: ESOPHAGOGASTRODUODENOSCOPY (EGD) WITH PROPOFOL;  Surgeon: Otis Brace, MD;  Location: [place];  Service: Gastroenterology;  Laterality: N/A;   ESOPHAGOGASTRODUODENOSCOPY (EGD) WITH PROPOFOL N/A 10/27/2019   Procedure: ESOPHAGOGASTRODUODENOSCOPY (EGD) WITH PROPOFOL;  Surgeon: Ronnette Juniper, MD;  Location: [place];  Service: Gastroenterology;  Laterality: N/A;   GIVENS CAPSULE STUDY N/A 10/27/2019   Procedure: GIVENS CAPSULE STUDY;  Surgeon: Therisa Doyne,  Megan Salon, MD;  Location: [place];  Service: Gastroenterology;  Laterality: N/A;   INSERT / REPLACE / REMOVE PACEMAKER     LEFT HEART CATHETERIZATION WITH CORONARY ANGIOGRAM N/A 11/04/2014   Procedure: LEFT HEART CATHETERIZATION WITH CORONARY ANGIOGRAM;  Surgeon: Troy Sine, MD;  [facility] C[facility] LAB;  Service: Cardiovascular;  Laterality: N/A;   PACEMAKER IMPLANT N/A 01/18/2021   Procedure: PACEMAKER IMPLANT;  Surgeon: Evans Lance, MD;  Location: [place] [facility];  Service: Cardiovascular;  Laterality: N/A;   PACEMAKER IMPLANT Left    POLYPECTOMY  10/11/2019   Procedure: POLYPECTOMY;  Surgeon: Otis Brace, MD;  Location: [place] [facility];  Service: Gastroenterology;;   RIGHT HEART CATH N/A 06/08/2022   Procedure: RIGHT HEART CATH;  Surgeon: [name], MD;  Location: [place] [facility];  Service: Cardiovascular;  Laterality: N/A;   TEE WITHOUT CARDIOVERSION N/A 11/14/2014   Procedure: TRANSESOPHAGEAL ECHOCARDIOGRAM (TEE);  Surgeon: [name], MD;  Location: [place];  Service: Open Heart Surgery;  Laterality: N/A;   TEE WITHOUT CARDIOVERSION N/A 12/25/2017   Procedure: TRANSESOPHAGEAL ECHOCARDIOGRAM (TEE);   Surgeon: [name], MD;  Location[facility];  Service: Cardiovascular;  Laterality: N/A;   TEMPORARY PACEMAKER INSERTION  11/04/2014   Procedure: TEMPORARY PACEMAKER INSERTION;  Surgeon: Troy Sine, MD;  Location: [facility] CATH LAB;  Service: Cardiovascular;;   TOOTH EXTRACTION       Family History  Problem Relation Age of Onset   Cancer Mother        LYMPHOMA   Heart disease Father    Other Father        TB   CVA Sister    Prostate cancer Brother 84   Diabetes Brother      Social History   Socioeconomic History   Marital status: Widowed    Spouse name: Not on file   Number of children: 1   Years of education: 20   Highest education level: Not on file  Occupational History   Occupation: RETIRED [employer]  Tobacco Use   Smoking status: Former    Packs/day: 0.50    Years: 56.00    Total pack years: 28.00    Types: Cigarettes    Quit date: 11/13/2021    Years since quitting: 0.6   Smokeless tobacco: Never   Tobacco comments:    smoking 2 cigarettes/day as of 10/23/20  Vaping Use   Vaping Use: Never used  Substance and Sexual Activity   Alcohol use: No    Alcohol/week: 0.0 standard drinks of alcohol   Drug use: No   Sexual activity: Not on file  Other Topics Concern   Not on file  Social History Narrative   Patient reports it being difficult to pay for everything due to outstanding medical bills from hospital stay last year but states she is able to keep up with basic expenses.  Patient does have some concerns with her house's condition due to a water leak- had her roof repaired last month but has some leaking in the front which has affected her porch.  Patient owns her own car and is able to drive herself but has some concerns about the reliability of her car- has gotten taxi to the clinic for appointment in the past when her car broke down.   Social Determinants of Health   Financial Resource Strain: Low Risk  (06/07/2018)   Overall Financial  Resource Strain (CARDIA)    Difficulty of Paying Living Expenses: Not hard  at all  Recent Concern: Financial Resource Strain - Medium Risk (05/21/2018)   Overall Financial Resource Strain (CARDIA)    Difficulty of Paying Living Expenses: Somewhat hard  Food Insecurity: No Food Insecurity (06/09/2022)   Hunger Vital Sign    Worried About Running Out of Food in the Last Year: Never true    [place] in the Last Year: Never true  Transportation Needs: No Transportation Needs (06/09/2022)   PRAPARE - Hydrologist (Medical): No    Lack of Transportation (Non-Medical): No  Physical Activity: Not on file  Stress: Not on file  Social Connections: Not on file  Intimate Partner Violence: Not At Risk (06/09/2022)   Humiliation, Afraid, Rape, and Kick questionnaire    Fear of Current or Ex-Partner: No    Emotionally Abused: No    Physically Abused: No    Sexually Abused: No     BP 130/76   Pulse 76   Wt 185 lb (83.9 kg)   BMI 28.98 kg/m   Physical Exam:  Well appearing NAD HEENT: Unremarkable Neck:  No JVD, no thyromegally Lymphatics:  No adenopathy Back:  No CVA tenderness Lungs:  Clear HEART:  Regular rate rhythm, no murmurs, no rubs, no clicks Abd:  soft, positive bowel sounds, no organomegally, no rebound, no guarding Ext:  2 plus pulses, no edema, no cyanosis, no clubbing Skin:  No rashes no nodules Neuro:  CN II through XII intact, motor grossly intact  EKG - atrial fib with ventricular pacing  DEVICE  Normal device function.  See PaceArt for details.   Assess/Plan: Tachy induced CM - we have controlled her VR but her EF has not improved. She has some dysynchrony on 2D echo and her EF remains down. Chronic systolic heart failure - I have discussed the treatment options and recommended upgrade to a Biv ICD. I have discussed the indications/risks/benefits/goals/expectations and she wishes to proceed. Coags - we will hold her blood thinners  prior to her procedure.  CHB - she is s/p AV node ablation with minimal escape.   Carleene Overlie Romy Mcgue,MD

## 2022-07-04 ENCOUNTER — Telehealth: Payer: Self-pay

## 2022-07-04 NOTE — Telephone Encounter (Signed)
LM for pt to call back to schedule her BiV-ICD upgrade.

## 2022-07-05 ENCOUNTER — Other Ambulatory Visit: Payer: Self-pay | Admitting: Family Medicine

## 2022-07-05 ENCOUNTER — Other Ambulatory Visit (HOSPITAL_COMMUNITY): Payer: Self-pay | Admitting: Emergency Medicine

## 2022-07-05 NOTE — Progress Notes (Signed)
Paramedicine Encounter    Patient ID: [name] [name], female    DOB: September 18, 1945, 76 y.o.   MRN: [id number]   There were no vitals taken for this visit. Weight yesterday-not taken Last visit weight-182.6lb  ATF Ms. [place] A&O x 4, skin W&D w/ good color.  Pt. Denies chest pain or SOB.  Lung sounds clear and equal bilat.  Some mild edema noted to his lower extremities.  She states she went to a birthday party and ate more than she should. She advises that she is taking all meds appropriately.  She does not want me to do her medications and says "I can do them myself."  No pending appointments.  Home visit complete.    [name], [place] 07/05/2022   Patient Care Team: Janith Lima, MD as PCP - General (Internal Medicine) Jerline Pain, MD as PCP - Cardiology (Cardiology) Evans Lance, MD as PCP - Electrophysiology (Cardiology) Debara Pickett Nadean Corwin, MD as Consulting Physician (Cardiology)  Patient Active Problem List   Diagnosis Date Noted  . Ceruminosis, bilateral 06/17/2022  . Flu vaccine need 06/17/2022  . Chronic combined systolic and diastolic CHF (congestive heart failure) ([place]) 06/17/2022  . Encounter for general adult medical examination with abnormal findings 06/16/2022  . Chronic anticoagulation 06/06/2022  . Severe mitral regurgitation 06/06/2022  . Controlled type 2 diabetes mellitus without complication, without long-term current use of insulin ([place]) 06/06/2022  . Pacemaker 02/22/2022  . Obstructive sleep apnea 09/21/2020  . Obesity (BMI 30.0-34.9) 02/10/2020  . COPD (chronic obstructive pulmonary disease) ([place]) 10/26/2019  . Thrombocytopenia ([place]) 09/14/2017  . Medication noncompliance due to cognitive impairment 09/06/2017  . Persistent atrial fibrillation ([place]) 08/04/2017  . Paroxysmal atrial fibrillation ([place]) 02/08/2017  . Cardiomyopathy, ischemic 02/08/2017  . CKD (chronic kidney disease), stage III ([place]) 01/30/2017  . Coronary artery  disease due to lipid rich plaque   . Essential hypertension     Current Outpatient Medications:  .  acetaminophen (TYLENOL) 325 MG tablet, Take 2 tablets (650 mg total) by mouth every 4 (four) hours as needed for headache or mild pain., Disp: , Rfl:  .  albuterol (VENTOLIN HFA) 108 (90 Base) MCG/ACT inhaler, Inhale 1-2 puffs into the lungs every 6 (six) hours as needed for wheezing or shortness of breath., Disp: 18 g, Rfl: 2 .  allopurinol (ZYLOPRIM) 100 MG tablet, Take 100 mg by mouth daily as needed (gout symptoms)., Disp: , Rfl:  .  atorvastatin (LIPITOR) 10 MG tablet, Take 1 tablet (10 mg total) by mouth at bedtime., Disp: 90 tablet, Rfl: 1 .  carboxymethylcellulose (REFRESH PLUS) 0.5 % SOLN, Place 1 drop into both eyes daily as needed (dry eyes)., Disp: , Rfl:  .  hydrALAZINE (APRESOLINE) 25 MG tablet, Take 1 tablet (25 mg total) by mouth 3 (three) times daily., Disp: 90 tablet, Rfl: 0 .  isosorbide mononitrate (IMDUR) 30 MG 24 hr tablet, Take 1 tablet (30 mg total) by mouth daily., Disp: 30 tablet, Rfl: 0 .  losartan (COZAAR) 50 MG tablet, Take 1 tablet (50 mg total) by mouth daily., Disp: 30 tablet, Rfl: 6 .  metFORMIN (GLUCOPHAGE) 500 MG tablet, Take 500 mg by mouth daily with breakfast. Daily with a meal, Disp: , Rfl:  .  pantoprazole (PROTONIX) 40 MG tablet, Take 1 tablet (40 mg total) by mouth daily., Disp: 30 tablet, Rfl: 2 .  rivaroxaban (XARELTO) 20 MG TABS tablet, Take 1 tablet (20 mg total) by mouth daily with supper. Resume from  11/01/2019, Disp: , Rfl:  .  spironolactone (ALDACTONE) 25 MG tablet, Take 0.5 tablets (12.5 mg total) by mouth daily., Disp: , Rfl:  .  torsemide (DEMADEX) 20 MG tablet, Take 1 tablet (20 mg total) by mouth daily., Disp: 30 tablet, Rfl: 1 .  TRELEGY ELLIPTA 100-62.5-25 MCG/ACT AEPB, Inhale ONE PUFF into THE lungs daily, Disp: 120 each, Rfl: 1 Allergies  Allergen Reactions  . Bee Venom Anaphylaxis  . Ivp Dye [Iodinated Contrast Media] Anaphylaxis  . Ace  Inhibitors     Other reaction(s): angioedema  . Atenolol     Other reaction(s): severe headaches  . Codeine Nausea And Vomiting  . Entresto [Sacubitril-Valsartan] Other (See Comments)    Chest pain   . Penicillin G     Other reaction(s): rash  . Shrimp [Shellfish Allergy] Swelling  . Simvastatin     Other reaction(s): not sure  . Jardiance [Empagliflozin] Other (See Comments)    Caused Boils       Social History   Socioeconomic History  . Marital status: Widowed    Spouse name: Not on file  . Number of children: 1  . Years of education: 80  . Highest education level: Not on file  Occupational History  . Occupation: RETIRED [employer]  Tobacco Use  . Smoking status: Former    Packs/day: 0.50    Years: 56.00    Total pack years: 28.00    Types: Cigarettes    Quit date: 11/13/2021    Years since quitting: 0.6  . Smokeless tobacco: Never  . Tobacco comments:    smoking 2 cigarettes/day as of 10/23/20  Vaping Use  . Vaping Use: Never used  Substance and Sexual Activity  . Alcohol use: No    Alcohol/week: 0.0 standard drinks of alcohol  . Drug use: No  . Sexual activity: Not on file  Other Topics Concern  . Not on file  Social History Narrative   Patient reports it being difficult to pay for everything due to outstanding medical bills from hospital stay last year but states she is able to keep up with basic expenses.  Patient does have some concerns with her house's condition due to a water leak- had her roof repaired last month but has some leaking in the front which has affected her porch.  Patient owns her own car and is able to drive herself but has some concerns about the reliability of her car- has gotten taxi to the clinic for appointment in the past when her car broke down.   Social Determinants of Health   Financial Resource Strain: Low Risk  (06/07/2018)   Overall Financial Resource Strain (CARDIA)   . Difficulty of Paying Living Expenses: Not hard  at all  Recent Concern: Financial Resource Strain - Medium Risk (05/21/2018)   Overall Financial Resource Strain (CARDIA)   . Difficulty of Paying Living Expenses: Somewhat hard  Food Insecurity: No Food Insecurity (06/09/2022)   Hunger Vital Sign   . Worried About Charity fundraiser in the Last Year: Never true   . Ran Out of Food in the Last Year: Never true  Transportation Needs: No Transportation Needs (06/09/2022)   PRAPARE - Transportation   . Lack of Transportation (Medical): No   . Lack of Transportation (Non-Medical): No  Physical Activity: Not on file  Stress: Not on file  Social Connections: Not on file  Intimate Partner Violence: Not At Risk (06/09/2022)   Humiliation, Afraid, Rape, and Kick questionnaire   .  Fear of Current or Ex-Partner: No   . Emotionally Abused: No   . Physically Abused: No   . Sexually Abused: No    Physical Exam      Future Appointments  Date Time Provider [place]  07/19/2022  7:00 AM [facility] DEVICE REMOTES [facility]  09/22/2022  2:30 PM [facility] PA/NP [facility] None  10/18/2022  7:00 AM [facility] DEVICE REMOTES [facility]  01/17/2023  7:00 AM [facility] DEVICE REMOTES [facility]  04/18/2023  7:05 AM [facility] DEVICE REMOTES [facility]  07/18/2023  7:00 AM [facility] DEVICE REMOTES [facility]  10/17/2023  7:00 AM [facility] DEVICE REMOTES [facility]       [name], EMT-P-Paramedic [place] [facility]  07/05/22

## 2022-07-06 ENCOUNTER — Telehealth (HOSPITAL_COMMUNITY): Payer: Self-pay | Admitting: Licensed Clinical Social Worker

## 2022-07-06 NOTE — Telephone Encounter (Signed)
H&V Care Navigation CSW Progress Note  Clinical Social Worker received call from case worker with [facility] that pt had informed their social worker that their heat was off.  CSW called pt to discuss.  She reports that she does currently have heat and has no concerns with the bill but she doesn't think the heater is working properly.  Pt owns her home so encouraged her to have someone come look at the heater to assess what is wrong and let us know if she has any financial barriers to fixing it.   SDOH Screenings   Food Insecurity: No Food Insecurity (06/09/2022)  Housing: Low Risk  (06/09/2022)  Transportation Needs: No Transportation Needs (06/09/2022)  Utilities: Not At Risk (06/09/2022)  Depression (PHQ2-9): Low Risk  (03/16/2022)  Financial Resource Strain: Low Risk  (06/07/2018)  Recent Concern: Financial Resource Strain - Medium Risk (05/21/2018)  Tobacco Use: Medium Risk (06/22/2022)   Jorge Ny, LCSW Clinical Social Worker [facility] Desk#: [phone] Cell#: [phone]

## 2022-07-13 NOTE — Telephone Encounter (Signed)
Dr Lovena Le aware of Pt concerns, and has spoken to scheduling.  [facility] scheduling is working with Pt to get her device upgrade scheduled as soon as possible.

## 2022-07-18 ENCOUNTER — Telehealth: Payer: Self-pay | Admitting: Internal Medicine

## 2022-07-18 NOTE — Telephone Encounter (Signed)
Per Kenney Houseman with [facility], the patient informed her that Dr. Lovena Le advised her that she will be having a procedure with him. Kenney Houseman states that that patient was unable to provide any additional information about this procedure and she is requesting to have a call made to the patient to discuss possible procedure for clarification. Kenney Houseman mentions that the patient is usually noncompliant and she may not answer her phone. Tonya requests that a call be made to her to discuss as well. Tonya requests a detailed voice message on her secure and confidential voicemail if she is unavailable when her call is returned.

## 2022-07-18 NOTE — Telephone Encounter (Signed)
[name] [name] with [facility] calls today in regards to PT's recent discharge. [name] [name] states they had visited PT as scheduled for the last 3 weeks. PT was non-compliant which has led to the discharge. PT stated they did not need their help and did not want to go through any therapy.  [name] [name] did want [place] to know that PT was having issues with the pricing of their xarelto RX. PT stated that she was low and,  due to finances,  would not be able to afford new RX. [name] [name] let PT know that their are assistance programs and ways for her to try to get the price lowered but that PT would need to take these initiatives.  [name] [name] also states that PT is having their blood sugar levels checked at least once a month (stated it was through a nurse with [facility], but she could not confirm this).

## 2022-07-19 ENCOUNTER — Ambulatory Visit (INDEPENDENT_AMBULATORY_CARE_PROVIDER_SITE_OTHER): Payer: HMO

## 2022-07-19 DIAGNOSIS — I255 Ischemic cardiomyopathy: Secondary | ICD-10-CM

## 2022-07-19 LAB — CUP PACEART REMOTE DEVICE CHECK
Date Time Interrogation Session: 20231205152231
Implantable Lead Connection Status: 753985
Implantable Lead Implant Date: 20220606
Implantable Lead Location: 753860
Implantable Lead Model: 377169
Implantable Lead Serial Number: 8000396500
Implantable Pulse Generator Implant Date: 20220606
Pulse Gen Model: 407145
Pulse Gen Serial Number: 70105656

## 2022-07-19 NOTE — Telephone Encounter (Signed)
Pt is returning call.  

## 2022-07-19 NOTE — Telephone Encounter (Signed)
*  Late Entry*   I called pt yesterday to get her scheduled. No answer, LM to call back.   I LM with Tonya @ [facility] letting her know that as soon as I get in touch with the pt and get her scheduled I will call her with the details.

## 2022-07-22 ENCOUNTER — Encounter: Payer: Self-pay | Admitting: Family Medicine

## 2022-07-26 ENCOUNTER — Telehealth: Payer: Self-pay

## 2022-07-26 ENCOUNTER — Other Ambulatory Visit (HOSPITAL_COMMUNITY): Payer: Self-pay | Admitting: Internal Medicine

## 2022-07-26 NOTE — Telephone Encounter (Signed)
Called patient lvm to return call, to complete AWV at [phone].  If no return call within 15 minutes, patient may reschedule for the next available appointment with [facility] or CMA. -S. Emanuele Mcwhirter,LPN 

## 2022-08-04 ENCOUNTER — Other Ambulatory Visit: Payer: Self-pay

## 2022-08-04 DIAGNOSIS — I480 Paroxysmal atrial fibrillation: Secondary | ICD-10-CM

## 2022-08-11 ENCOUNTER — Telehealth (HOSPITAL_COMMUNITY): Payer: Self-pay | Admitting: Emergency Medicine

## 2022-08-11 NOTE — Telephone Encounter (Signed)
Finally reached [name] [name] by phone.  She did not wish for me to visit but I convinced her to let me come tomorrow @ 2:15.  She told me to call her tomorrow to make sure she was home. I advised her I would do same.    [name], [place] 08/11/2022

## 2022-08-12 ENCOUNTER — Other Ambulatory Visit (HOSPITAL_COMMUNITY): Payer: Self-pay | Admitting: Emergency Medicine

## 2022-08-12 ENCOUNTER — Telehealth: Payer: Self-pay | Admitting: Internal Medicine

## 2022-08-12 NOTE — Telephone Encounter (Signed)
[name] - EMT Paramedic is currently in home of patient and would like for a nurse to speak with patient on the importance of upcoming procedure on 01/22, because she states that she is there and patient is currently wanting to cancel that procedure.

## 2022-08-12 NOTE — Progress Notes (Signed)
Paramedicine Encounter    Patient ID: [name] [name], female    DOB: [birth year], 76 y.o.   MRN: [id number]   There were no vitals taken for this visit. Weight yesterday-not taken Last visit weight-182lb  ATF Mr. Elayjah A&O x 4, skin W&D w/ good color.  Pt. Advises she is compliant with her meds but I have no way to verify as she does not want me to do anything with her meds.  During my assessment I noted that she would nod off in mid conversation and seemed fairly lethargic.  CBG 107.  I checked a capnography reading and her CO2 was staying anywhere between 18-25.  She is not on home O2.  She does admit to be sleepy all the time.  She denies chest pain or SOB.  No edema noted.  I suggested she seek further medical evaluation and she refused same.  She states she wants to be home for new years.  I advised her that now being evaluated further was against my medical advice.  I did generate an EMS ticket to document refusal AMA.  I advised her if she felt in distress or began having chest pain to call 911 and she verbalized she would do same.   I assisted her with activating her new U Card and helped educate her on how to utilize same.  Home visit complete.    [name], [place] 08/19/2022   Patient Care Team: Janith Lima, MD as PCP - General (Internal Medicine) Jerline Pain, MD as PCP - Cardiology (Cardiology) Evans Lance, MD as PCP - Electrophysiology (Cardiology) [name], MD as Consulting Physician (Cardiology)  Patient Active Problem List   Diagnosis Date Noted   Ceruminosis, bilateral 06/17/2022   Flu vaccine need 06/17/2022   Chronic combined systolic and diastolic CHF (congestive heart failure) ([place]) 06/17/2022   Encounter for general adult medical examination with abnormal findings 06/16/2022   Chronic anticoagulation 06/06/2022   Severe mitral regurgitation 06/06/2022   Controlled type 2 diabetes mellitus without complication, without long-term  current use of insulin ([place]) 06/06/2022   Pacemaker 02/22/2022   Obstructive sleep apnea 09/21/2020   Obesity (BMI 30.0-34.9) 02/10/2020   COPD (chronic obstructive pulmonary disease) ([place]) 10/26/2019   Thrombocytopenia ([place]) 09/14/2017   Medication noncompliance due to cognitive impairment 09/06/2017   Persistent atrial fibrillation ([place]) 08/04/2017   Paroxysmal atrial fibrillation ([place]) 02/08/2017   Cardiomyopathy, ischemic 02/08/2017   CKD (chronic kidney disease), stage III ([place]) 01/30/2017   Coronary artery disease due to lipid rich plaque    Essential hypertension     Current Outpatient Medications:    acetaminophen (TYLENOL) 325 MG tablet, Take 2 tablets (650 mg total) by mouth every 4 (four) hours as needed for headache or mild pain., Disp: , Rfl:    albuterol (VENTOLIN HFA) 108 (90 Base) MCG/ACT inhaler, Inhale 1-2 puffs into the lungs every 6 (six) hours as needed for wheezing or shortness of breath., Disp: 18 g, Rfl: 2   allopurinol (ZYLOPRIM) 100 MG tablet, Take 100 mg by mouth daily as needed (gout symptoms)., Disp: , Rfl:    atorvastatin (LIPITOR) 10 MG tablet, Take 1 tablet (10 mg total) by mouth at bedtime., Disp: 90 tablet, Rfl: 1   carboxymethylcellulose (REFRESH PLUS) 0.5 % SOLN, Place 1 drop into both eyes daily as needed (dry eyes)., Disp: , Rfl:    hydrALAZINE (APRESOLINE) 25 MG tablet, Take 1 tablet (25 mg total) by mouth 3 (three) times daily.,  Disp: 90 tablet, Rfl: 0   isosorbide mononitrate (IMDUR) 30 MG 24 hr tablet, Take 1 tablet (30 mg total) by mouth daily., Disp: 30 tablet, Rfl: 0   losartan (COZAAR) 50 MG tablet, TAKE ONE TABLET BY MOUTH DAILY, Disp: 30 tablet, Rfl: 6   metFORMIN (GLUCOPHAGE) 500 MG tablet, Take 500 mg by mouth daily with breakfast. Daily with a meal, Disp: , Rfl:    pantoprazole (PROTONIX) 40 MG tablet, Take 1 tablet (40 mg total) by mouth daily., Disp: 30 tablet, Rfl: 2   rivaroxaban (XARELTO) 20 MG TABS tablet, Take 1 tablet (20 mg total)  by mouth daily with supper. Resume from 11/01/2019, Disp: , Rfl:    spironolactone (ALDACTONE) 25 MG tablet, TAKE ONE TABLET BY MOUTH DAILY, Disp: 30 tablet, Rfl: 11   torsemide (DEMADEX) 20 MG tablet, Take 1 tablet (20 mg total) by mouth daily., Disp: 30 tablet, Rfl: 1   TRELEGY ELLIPTA 100-62.5-25 MCG/ACT AEPB, Inhale ONE PUFF into THE lungs daily, Disp: 120 each, Rfl: 1 Allergies  Allergen Reactions   Bee Venom Anaphylaxis   Ivp Dye [Iodinated Contrast Media] Anaphylaxis   Ace Inhibitors     Other reaction(s): angioedema   Atenolol     Other reaction(s): severe headaches   Codeine Nausea And Vomiting   Entresto [Sacubitril-Valsartan] Other (See Comments)    Chest pain    Penicillin G     Other reaction(s): rash   Shrimp [Shellfish Allergy] Swelling   Simvastatin     Other reaction(s): not sure   Jardiance [Empagliflozin] Other (See Comments)    Caused Boils       Social History   Socioeconomic History   Marital status: Widowed    Spouse name: Not on file   Number of children: 1   Years of education: 24   Highest education level: Not on file  Occupational History   Occupation: RETIRED [employer]  Tobacco Use   Smoking status: Former    Packs/day: 0.50    Years: 56.00    Total pack years: 28.00    Types: Cigarettes    Quit date: 11/13/2021    Years since quitting: 0.7   Smokeless tobacco: Never   Tobacco comments:    smoking 2 cigarettes/day as of 10/23/20  Vaping Use   Vaping Use: Never used  Substance and Sexual Activity   Alcohol use: No    Alcohol/week: 0.0 standard drinks of alcohol   Drug use: No   Sexual activity: Not on file  Other Topics Concern   Not on file  Social History Narrative   Patient reports it being difficult to pay for everything due to outstanding medical bills from hospital stay last year but states she is able to keep up with basic expenses.  Patient does have some concerns with her house's condition due to a water leak- had  her roof repaired last month but has some leaking in the front which has affected her porch.  Patient owns her own car and is able to drive herself but has some concerns about the reliability of her car- has gotten taxi to the clinic for appointment in the past when her car broke down.   Social Determinants of Health   Financial Resource Strain: Low Risk  (06/07/2018)   Overall Financial Resource Strain (CARDIA)    Difficulty of Paying Living Expenses: Not hard at all  Recent Concern: Financial Resource Strain - Medium Risk (05/21/2018)   Overall Financial Resource Strain (CARDIA)  Difficulty of Paying Living Expenses: Somewhat hard  Food Insecurity: No Food Insecurity (06/09/2022)   Hunger Vital Sign    Worried About Running Out of Food in the Last Year: Never true    Ran Out of Food in the Last Year: Never true  Transportation Needs: No Transportation Needs (06/09/2022)   PRAPARE - Hydrologist (Medical): No    Lack of Transportation (Non-Medical): No  Physical Activity: Not on file  Stress: Not on file  Social Connections: Not on file  Intimate Partner Violence: Not At Risk (06/09/2022)   Humiliation, Afraid, Rape, and Kick questionnaire    Fear of Current or Ex-Partner: No    Emotionally Abused: No    Physically Abused: No    Sexually Abused: No    Physical Exam      Future Appointments  Date Time Provider [place]  08/22/2022  2:45 PM [facility]  09/15/2022 10:40 AM [facility] 1 [facility]  09/22/2022 11:00 AM [facility] PA/NP [facility] None  10/18/2022  7:00 AM [facility] DEV[facility] REMOTES [facility]  12/08/2022  1:45 PM Evans Lance, MD [facility]  01/17/2023  7:00 AM [facility] REMOTES [facility]  04/18/2023  7:05 AM [facility] REMOTES [facility]  07/18/2023  7:00 AM [facility] DEV[facility] REMOTES [facility]   10/17/2023  7:00 AM [facility] DEV[facility] REMOTES [facility]       [name], [place] Paramedic  08/12/22

## 2022-08-12 NOTE — Telephone Encounter (Signed)
Spoke with patient about purpose of and function of AICD device. All questions answered, patient verbalizes understanding.

## 2022-08-16 NOTE — Progress Notes (Signed)
Remote pacemaker transmission.   

## 2022-08-17 ENCOUNTER — Telehealth: Payer: Self-pay | Admitting: Internal Medicine

## 2022-08-18 ENCOUNTER — Telehealth: Payer: Self-pay | Admitting: Internal Medicine

## 2022-08-18 ENCOUNTER — Other Ambulatory Visit: Payer: Self-pay | Admitting: Internal Medicine

## 2022-08-18 DIAGNOSIS — I4819 Other persistent atrial fibrillation: Secondary | ICD-10-CM

## 2022-08-18 MED ORDER — RIVAROXABAN 15 MG PO TABS: 15.0000 mg | ORAL_TABLET | Freq: Every day | ORAL | 0 refills | Status: DC

## 2022-08-18 NOTE — Telephone Encounter (Signed)
Caller & Relationship to patient: self  Call back number: [phone]   Date of last office visit: 11.23.23  Date of next office visit: N/a  Medication(s) to be refilled:  rivaroxaban Alveda Reasons) 20 MG TABS tablet   Preferred Pharmacy:   [id number]  Phone: [phone] Fax: [phone]

## 2022-08-19 ENCOUNTER — Telehealth (HOSPITAL_COMMUNITY): Payer: Self-pay | Admitting: Emergency Medicine

## 2022-08-19 NOTE — Telephone Encounter (Signed)
Called and spoke with [name] [name] to confirm our appointment today and she advised today was not going to be good for her.  She is having some work done at there house and has errands to run.  I advised I would reach out to her Tuesday of next week to schedule another appointment.    [name], [place] 08/19/2022

## 2022-08-19 NOTE — Telephone Encounter (Signed)
Called to confirm todays home visit.  No answer and unable to leave voice mail.    [name], [place] 08/19/2022

## 2022-08-20 DIAGNOSIS — J40 Bronchitis, not specified as acute or chronic: Secondary | ICD-10-CM | POA: Diagnosis not present

## 2022-08-20 DIAGNOSIS — R0602 Shortness of breath: Secondary | ICD-10-CM | POA: Diagnosis not present

## 2022-08-22 ENCOUNTER — Encounter: Payer: Self-pay | Admitting: Family Medicine

## 2022-08-22 ENCOUNTER — Ambulatory Visit: Payer: Medicare Other | Attending: Internal Medicine

## 2022-08-22 DIAGNOSIS — I480 Paroxysmal atrial fibrillation: Secondary | ICD-10-CM

## 2022-08-23 LAB — BASIC METABOLIC PANEL
BUN/Creatinine Ratio: 17 (ref 12–28)
BUN: 20 mg/dL (ref 8–27)
CO2: 19 mmol/L — ABNORMAL LOW (ref 20–29)
Calcium: 9.8 mg/dL (ref 8.7–10.3)
Chloride: 108 mmol/L — ABNORMAL HIGH (ref 96–106)
Creatinine, Ser: 1.18 mg/dL — ABNORMAL HIGH (ref 0.57–1.00)
Glucose: 92 mg/dL (ref 70–99)
Potassium: 4.1 mmol/L (ref 3.5–5.2)
Sodium: 141 mmol/L (ref 134–144)
eGFR: 48 mL/min/{1.73_m2} — ABNORMAL LOW (ref >59)

## 2022-08-23 LAB — CBC
Hematocrit: 48.5 % — ABNORMAL HIGH (ref 34.0–46.6)
Hemoglobin: 16.2 g/dL — ABNORMAL HIGH (ref 11.1–15.9)
MCH: 28.6 pg (ref 26.6–33.0)
MCHC: 33.4 g/dL (ref 31.5–35.7)
MCV: 86 fL (ref 79–97)
Platelets: 136 10*3/uL — ABNORMAL LOW (ref 150–450)
RBC: 5.66 x10E6/uL — ABNORMAL HIGH (ref 3.77–5.28)
RDW: 17 % — ABNORMAL HIGH (ref 11.7–15.4)
WBC: 5.9 10*3/uL (ref 3.4–10.8)

## 2022-08-24 ENCOUNTER — Telehealth: Payer: Self-pay | Admitting: Internal Medicine

## 2022-08-24 ENCOUNTER — Telehealth (HOSPITAL_COMMUNITY): Payer: Self-pay | Admitting: Emergency Medicine

## 2022-08-24 NOTE — Telephone Encounter (Signed)
Called pt, LVM stating that samples are ready for pick up.  Located at the front desk.

## 2022-08-24 NOTE — Telephone Encounter (Signed)
Patient called and would like to know if she can get some samples of the [facility] '15mg'$ . If possible she would like a callback to confirm at [phone].

## 2022-08-24 NOTE — Telephone Encounter (Signed)
Called and spoke with [name] [name] and scheduled home visit for Friday @ 1:00.    [name], [place] 08/24/2022

## 2022-08-26 ENCOUNTER — Encounter: Payer: Self-pay | Admitting: Internal Medicine

## 2022-08-26 ENCOUNTER — Telehealth: Payer: Self-pay | Admitting: Internal Medicine

## 2022-08-26 ENCOUNTER — Other Ambulatory Visit (HOSPITAL_COMMUNITY): Payer: Self-pay

## 2022-08-26 ENCOUNTER — Other Ambulatory Visit (HOSPITAL_COMMUNITY): Payer: Self-pay | Admitting: Emergency Medicine

## 2022-08-26 NOTE — Telephone Encounter (Signed)
Patient states that she needs help paying for you xarelto.  Please advise -   Please call patient at:  [phone]

## 2022-08-26 NOTE — Progress Notes (Signed)
Paramedicine Encounter    Patient ID: [name] [name], female    DOB: 11/23/1945, 77 y.o.   MRN: [id number]   There were no vitals taken for this visit. Weight yesterday-not taken Last visit weight-182lb  Patient Care Team: Janith Lima, MD as PCP - General (Internal Medicine) Jerline Pain, MD as PCP - Cardiology (Cardiology) Evans Lance, MD as PCP - Electrophysiology (Cardiology) Debara Pickett Nadean Corwin, MD as Consulting Physician (Cardiology)  Patient Active Problem List   Diagnosis Date Noted  . Ceruminosis, bilateral 06/17/2022  . Flu vaccine need 06/17/2022  . Chronic combined systolic and diastolic CHF (congestive heart failure) ([place]) 06/17/2022  . Encounter for general adult medical examination with abnormal findings 06/16/2022  . Chronic anticoagulation 06/06/2022  . Severe mitral regurgitation 06/06/2022  . Controlled type 2 diabetes mellitus without complication, without long-term current use of insulin ([place]) 06/06/2022  . Pacemaker 02/22/2022  . Obstructive sleep apnea 09/21/2020  . Obesity (BMI 30.0-34.9) 02/10/2020  . COPD (chronic obstructive pulmonary disease) ([place]) 10/26/2019  . Thrombocytopenia ([place]) 09/14/2017  . Medication noncompliance due to cognitive impairment 09/06/2017  . Persistent atrial fibrillation ([place]) 08/04/2017  . Paroxysmal atrial fibrillation ([place]) 02/08/2017  . Cardiomyopathy, ischemic 02/08/2017  . CKD (chronic kidney disease), stage III ([place]) 01/30/2017  . Coronary artery disease due to lipid rich plaque   . Essential hypertension     Current Outpatient Medications:  .  acetaminophen (TYLENOL) 325 MG tablet, Take 2 tablets (650 mg total) by mouth every 4 (four) hours as needed for headache or mild pain., Disp: , Rfl:  .  albuterol (VENTOLIN HFA) 108 (90 Base) MCG/ACT inhaler, Inhale 1-2 puffs into the lungs every 6 (six) hours as needed for wheezing or shortness of breath., Disp: 18 g, Rfl: 2 .  allopurinol (ZYLOPRIM) 100 MG tablet, Take  100 mg by mouth daily as needed (gout symptoms)., Disp: , Rfl:  .  atorvastatin (LIPITOR) 10 MG tablet, Take 1 tablet (10 mg total) by mouth at bedtime., Disp: 90 tablet, Rfl: 1 .  carboxymethylcellulose (REFRESH PLUS) 0.5 % SOLN, Place 1 drop into both eyes daily as needed (dry eyes). (Patient not taking: Reported on 08/12/2022), Disp: , Rfl:  .  hydrALAZINE (APRESOLINE) 25 MG tablet, Take 1 tablet (25 mg total) by mouth 3 (three) times daily., Disp: 90 tablet, Rfl: 0 .  isosorbide mononitrate (IMDUR) 30 MG 24 hr tablet, Take 1 tablet (30 mg total) by mouth daily., Disp: 30 tablet, Rfl: 0 .  losartan (COZAAR) 50 MG tablet, TAKE ONE TABLET BY MOUTH DAILY, Disp: 30 tablet, Rfl: 6 .  metFORMIN (GLUCOPHAGE) 500 MG tablet, Take 500 mg by mouth daily with breakfast. Daily with a meal, Disp: , Rfl:  .  pantoprazole (PROTONIX) 40 MG tablet, Take 1 tablet (40 mg total) by mouth daily., Disp: 30 tablet, Rfl: 2 .  Rivaroxaban (XARELTO) 15 MG TABS tablet, Take 1 tablet (15 mg total) by mouth daily with supper., Disp: 90 tablet, Rfl: 0 .  spironolactone (ALDACTONE) 25 MG tablet, TAKE ONE TABLET BY MOUTH DAILY, Disp: 30 tablet, Rfl: 11 .  torsemide (DEMADEX) 20 MG tablet, Take 1 tablet (20 mg total) by mouth daily., Disp: 30 tablet, Rfl: 1 .  TRELEGY ELLIPTA 100-62.5-25 MCG/ACT AEPB, Inhale ONE PUFF into THE lungs daily, Disp: 120 each, Rfl: 1 Allergies  Allergen Reactions  . Bee Venom Anaphylaxis  . Ivp Dye [Iodinated Contrast Media] Anaphylaxis  . Ace Inhibitors     Other reaction(s): angioedema  .  Atenolol     Other reaction(s): severe headaches  . Codeine Nausea And Vomiting  . Entresto [Sacubitril-Valsartan] Other (See Comments)    Chest pain   . Penicillin G     Other reaction(s): rash  . Shrimp [Shellfish Allergy] Swelling  . Simvastatin     Other reaction(s): not sure  . Jardiance [Empagliflozin] Other (See Comments)    Caused Boils       Social History   Socioeconomic History  .  Marital status: Widowed    Spouse name: Not on file  . Number of children: 1  . Years of education: 39  . Highest education level: Not on file  Occupational History  . Occupation: RETIRED [employer]  Tobacco Use  . Smoking status: Former    Packs/day: 0.50    Years: 56.00    Total pack years: 28.00    Types: Cigarettes    Quit date: 11/13/2021    Years since quitting: 0.7  . Smokeless tobacco: Never  . Tobacco comments:    smoking 2 cigarettes/day as of 10/23/20  Vaping Use  . Vaping Use: Never used  Substance and Sexual Activity  . Alcohol use: No    Alcohol/week: 0.0 standard drinks of alcohol  . Drug use: No  . Sexual activity: Not on file  Other Topics Concern  . Not on file  Social History Narrative   Patient reports it being difficult to pay for everything due to outstanding medical bills from hospital stay last year but states she is able to keep up with basic expenses.  Patient does have some concerns with her house's condition due to a water leak- had her roof repaired last month but has some leaking in the front which has affected her porch.  Patient owns her own car and is able to drive herself but has some concerns about the reliability of her car- has gotten taxi to the clinic for appointment in the past when her car broke down.   Social Determinants of Health   Financial Resource Strain: Low Risk  (06/07/2018)   Overall Financial Resource Strain (CARDIA)   . Difficulty of Paying Living Expenses: Not hard at all  Recent Concern: Financial Resource Strain - Medium Risk (05/21/2018)   Overall Financial Resource Strain (CARDIA)   . Difficulty of Paying Living Expenses: Somewhat hard  Food Insecurity: No Food Insecurity (06/09/2022)   Hunger Vital Sign   . Worried About Charity fundraiser in the Last Year: Never true   . Ran Out of Food in the Last Year: Never true  Transportation Needs: No Transportation Needs (06/09/2022)   PRAPARE - Transportation   .  Lack of Transportation (Medical): No   . Lack of Transportation (Non-Medical): No  Physical Activity: Not on file  Stress: Not on file  Social Connections: Not on file  Intimate Partner Violence: Not At Risk (06/09/2022)   Humiliation, Afraid, Rape, and Kick questionnaire   . Fear of Current or Ex-Partner: No   . Emotionally Abused: No   . Physically Abused: No   . Sexually Abused: No    Physical Exam      Future Appointments  Date Time Provider [place]  09/15/2022 10:40 AM [facility]  09/22/2022 11:00 AM [facility] PA/NP [facility] None  10/18/2022  7:00 AM [facility] DEVICE REMOTES [facility]  12/08/2022  1:45 PM Evans Lance, MD [facility]  01/17/2023  7:00 AM [facility] REMOTES [facility]  04/18/2023  7:05 AM [facility] REMOTES [facility]  07/18/2023  7:00 AM [facility] DEVICE REMOTES [facility]  10/17/2023  7:00 AM [facility] DEVICE REMOTES [facility]       Desmond Lope [phone] [facility] Paramedic  08/26/22

## 2022-08-29 ENCOUNTER — Other Ambulatory Visit (HOSPITAL_COMMUNITY): Payer: Self-pay

## 2022-08-29 NOTE — Telephone Encounter (Addendum)
Mailed PAP application FOR XARELTO to patient home.  I will be faxing provider portion please have provider to review and sign and fax to [phone] ATTENTION Christabelle Hanzlik L.  I have spoke to patient and inform her to that I would be helping her to get the Xarelto though PAP. I will be mailing her the application to her home and sending the provider portion to your office for review and signature.   PLEASE BE ADVISE

## 2022-08-30 ENCOUNTER — Telehealth: Payer: Self-pay | Admitting: Internal Medicine

## 2022-08-30 NOTE — Telephone Encounter (Signed)
Forms received, completed and given to PCP to sign.

## 2022-08-30 NOTE — Telephone Encounter (Signed)
I think the office received the provider portion and just faxed it faxed it back  to me if you can located it and give to provider to review and fax back to me at [phone]. If you can not located it I can re fax it just let me know   Thanks again, Selinda Orion CPhT Rx Patient Advocate

## 2022-08-30 NOTE — Telephone Encounter (Signed)
Patient called stating she needs to postpone her surgery she has coming up on Monday 09/05/22.  She said she has a cold, has a runny nose and can hardly breath.

## 2022-08-30 NOTE — Telephone Encounter (Signed)
Called Pt back per message received in [place] Triage.    Pt called back, and stated she is not feeling well today, has nasal congestion.    Pt procedure scheduled for 09/05/2022.  After talking to patient and her concerns, we agreed to reassess her health on Friday, 09/02/2022, and if she is not feeling well by then, a message will be sent to scheduling to re-schedule her procedure.  Pt will call [facility] and let us know.

## 2022-08-30 NOTE — Telephone Encounter (Signed)
Yes ma'am I just re sent it to [place], [place].Marina Gravel AGAIN!!

## 2022-08-31 ENCOUNTER — Telehealth: Payer: Self-pay | Admitting: Internal Medicine

## 2022-08-31 MED ORDER — METFORMIN HCL 500 MG PO TABS: 500.0000 mg | ORAL_TABLET | Freq: Every day | ORAL | 0 refills | Status: DC

## 2022-08-31 NOTE — Telephone Encounter (Signed)
Forms have been signed and faxed back to [place].

## 2022-08-31 NOTE — Telephone Encounter (Signed)
Form received not review by md or sign the office just sent it back no signature or fill out.   Thanks Sandre Kitty Rx Patient Advocate

## 2022-08-31 NOTE — Telephone Encounter (Addendum)
I called PT to follow up with her about her PAP application and she said she was waiting on her w2 and then she would fill it out and bring in to office if you receive it can you please have your office to send to [place].   Sorry [place] , Discard other note  I did received it just got it thanks   [place] Rx Patient Advocate

## 2022-08-31 NOTE — Telephone Encounter (Signed)
MEDICATION: metFORMIN (GLUCOPHAGE) 500 MG tablet   [facility] [address] [place], [place] [address]  Comments:   **Let patient know to contact pharmacy at the end of the day to make sure medication is ready. **  ** Please notify patient to allow 48-72 hours to process**  **Encourage patient to contact the pharmacy for refills or they can request refills through [facility]**

## 2022-09-01 ENCOUNTER — Telehealth: Payer: Self-pay | Admitting: Internal Medicine

## 2022-09-01 ENCOUNTER — Other Ambulatory Visit: Payer: Self-pay | Admitting: Internal Medicine

## 2022-09-01 DIAGNOSIS — I5042 Chronic combined systolic (congestive) and diastolic (congestive) heart failure: Secondary | ICD-10-CM

## 2022-09-01 MED ORDER — HYDRALAZINE HCL 25 MG PO TABS: 25.0000 mg | ORAL_TABLET | Freq: Three times a day (TID) | ORAL | 1 refills | Status: DC

## 2022-09-01 NOTE — Telephone Encounter (Signed)
Caller & Relationship to patient:Self   Call back number:[phone]   Date of last office visit:06/2022   Date of next office visit:   Medication(s) to be refilled:hydralazine        [facility] [address]

## 2022-09-02 NOTE — Telephone Encounter (Signed)
Pt states she is still not feeling well and will like to r/s her procedure scheduled for Monday

## 2022-09-05 ENCOUNTER — Other Ambulatory Visit: Payer: Self-pay

## 2022-09-05 ENCOUNTER — Encounter (HOSPITAL_COMMUNITY): Admission: RE | Payer: Self-pay | Source: Home / Self Care

## 2022-09-05 ENCOUNTER — Emergency Department (HOSPITAL_COMMUNITY): Payer: Medicare Other

## 2022-09-05 ENCOUNTER — Emergency Department (HOSPITAL_COMMUNITY)
Admission: EM | Admit: 2022-09-05 | Discharge: 2022-09-06 | Disposition: A | Discharge status: Home / Self Care | Payer: Medicare Other | Attending: Emergency Medicine | Admitting: Emergency Medicine

## 2022-09-05 ENCOUNTER — Ambulatory Visit (HOSPITAL_COMMUNITY): Admission: RE | Admit: 2022-09-05 | Payer: Medicare Other | Source: Home / Self Care | Admitting: Internal Medicine

## 2022-09-05 DIAGNOSIS — R7989 Other specified abnormal findings of blood chemistry: Secondary | ICD-10-CM | POA: Diagnosis not present

## 2022-09-05 DIAGNOSIS — D45 Polycythemia vera: Secondary | ICD-10-CM | POA: Diagnosis not present

## 2022-09-05 DIAGNOSIS — Z7901 Long term (current) use of anticoagulants: Secondary | ICD-10-CM | POA: Diagnosis not present

## 2022-09-05 DIAGNOSIS — Z79899 Other long term (current) drug therapy: Secondary | ICD-10-CM | POA: Insufficient documentation

## 2022-09-05 DIAGNOSIS — R0789 Other chest pain: Secondary | ICD-10-CM | POA: Diagnosis not present

## 2022-09-05 DIAGNOSIS — N289 Disorder of kidney and ureter, unspecified: Secondary | ICD-10-CM

## 2022-09-05 DIAGNOSIS — E1122 Type 2 diabetes mellitus with diabetic chronic kidney disease: Secondary | ICD-10-CM | POA: Insufficient documentation

## 2022-09-05 DIAGNOSIS — R1310 Dysphagia, unspecified: Secondary | ICD-10-CM | POA: Diagnosis not present

## 2022-09-05 DIAGNOSIS — R079 Chest pain, unspecified: Secondary | ICD-10-CM | POA: Diagnosis not present

## 2022-09-05 DIAGNOSIS — I130 Hypertensive heart and chronic kidney disease with heart failure and stage 1 through stage 4 chronic kidney disease, or unspecified chronic kidney disease: Secondary | ICD-10-CM | POA: Diagnosis not present

## 2022-09-05 DIAGNOSIS — R778 Other specified abnormalities of plasma proteins: Secondary | ICD-10-CM | POA: Diagnosis not present

## 2022-09-05 DIAGNOSIS — R109 Unspecified abdominal pain: Secondary | ICD-10-CM | POA: Diagnosis not present

## 2022-09-05 DIAGNOSIS — I509 Heart failure, unspecified: Secondary | ICD-10-CM | POA: Diagnosis not present

## 2022-09-05 DIAGNOSIS — Z7951 Long term (current) use of inhaled steroids: Secondary | ICD-10-CM | POA: Insufficient documentation

## 2022-09-05 DIAGNOSIS — N189 Chronic kidney disease, unspecified: Secondary | ICD-10-CM | POA: Diagnosis not present

## 2022-09-05 DIAGNOSIS — Z7984 Long term (current) use of oral hypoglycemic drugs: Secondary | ICD-10-CM | POA: Diagnosis not present

## 2022-09-05 DIAGNOSIS — Z950 Presence of cardiac pacemaker: Secondary | ICD-10-CM | POA: Diagnosis not present

## 2022-09-05 DIAGNOSIS — D751 Secondary polycythemia: Secondary | ICD-10-CM

## 2022-09-05 DIAGNOSIS — I517 Cardiomegaly: Secondary | ICD-10-CM | POA: Diagnosis not present

## 2022-09-05 DIAGNOSIS — I480 Paroxysmal atrial fibrillation: Secondary | ICD-10-CM | POA: Insufficient documentation

## 2022-09-05 LAB — CBC
HCT: 48.9 % — ABNORMAL HIGH (ref 36.0–46.0)
Hemoglobin: 16.4 g/dL — ABNORMAL HIGH (ref 12.0–15.0)
MCH: 28.9 pg (ref 26.0–34.0)
MCHC: 33.5 g/dL (ref 30.0–36.0)
MCV: 86.1 fL (ref 80.0–100.0)
Platelets: 118 10*3/uL — ABNORMAL LOW (ref 150–400)
RBC: 5.68 MIL/uL — ABNORMAL HIGH (ref 3.87–5.11)
RDW: 19.1 % — ABNORMAL HIGH (ref 11.5–15.5)
WBC: 8.5 10*3/uL (ref 4.0–10.5)
nRBC: 0 % (ref 0.0–0.2)

## 2022-09-05 SURGERY — BIV ICD INSERTION CRT-D

## 2022-09-05 NOTE — ED Triage Notes (Signed)
Pt via POV c/o left sided chest pain that started this afternoon around 5 pm while cooking. Worsening with passing fluids. Extensive cardiac hx, including cardiac arrest, CBAG x2, CHF, CAD, and pacemaker. Endorse cough/runny nose x1 week.

## 2022-09-05 NOTE — ED Provider Triage Note (Signed)
Emergency Medicine Provider Triage Evaluation Note  [name] [name] , a 77 y.o. female  was evaluated in triage.  Pt complains of chest pain/pressure, started today, states that it happened after she ate an apple, she feels in the middle of her chest on her left side, she states when she swallows it hurts, denies any nausea or vomiting, she is still able to tolerate p.o., no associate shortness of breath, no worsening peripheral edema.  Still passing gas having normal bowel movements denies any bloody stools or dark tarry stools..  Review of Systems  Positive: Chest pain, pain with swallowing Negative: Nausea or vomiting  Physical Exam  BP (!) 170/104 (BP Location: Right Arm)   Pulse 86   Temp 98.5 F (36.9 C)   Resp 20   Ht '5\' 7"'$  (1.702 m)   Wt 81.2 kg   SpO2 98%   BMI 28.04 kg/m  Gen:   Awake, no distress   Resp:  Normal effort  MSK:   Moves extremities without difficulty  Other:    Medical Decision Making  Medically screening exam initiated at 11:39 PM.  Appropriate orders placed.  [name] [name] was informed that the remainder of the evaluation will be completed by another provider, this initial triage assessment does not replace that evaluation, and the importance of remaining in the ED until their evaluation is complete.  Lab work imaging have been ordered will need further workup.   Marcello Fennel, PA-C 09/05/22 [id number]

## 2022-09-06 DIAGNOSIS — R109 Unspecified abdominal pain: Secondary | ICD-10-CM | POA: Diagnosis not present

## 2022-09-06 DIAGNOSIS — I517 Cardiomegaly: Secondary | ICD-10-CM | POA: Diagnosis not present

## 2022-09-06 DIAGNOSIS — R079 Chest pain, unspecified: Secondary | ICD-10-CM | POA: Diagnosis not present

## 2022-09-06 DIAGNOSIS — Z950 Presence of cardiac pacemaker: Secondary | ICD-10-CM | POA: Diagnosis not present

## 2022-09-06 LAB — BASIC METABOLIC PANEL
Anion gap: 10 (ref 5–15)
BUN: 28 mg/dL — ABNORMAL HIGH (ref 8–23)
CO2: 20 mmol/L — ABNORMAL LOW (ref 22–32)
Calcium: 9.4 mg/dL (ref 8.9–10.3)
Chloride: 109 mmol/L (ref 98–111)
Creatinine, Ser: 1.23 mg/dL — ABNORMAL HIGH (ref 0.44–1.00)
GFR, Estimated: 45 mL/min — ABNORMAL LOW (ref >60)
Glucose, Bld: 106 mg/dL — ABNORMAL HIGH (ref 70–99)
Potassium: 3.6 mmol/L (ref 3.5–5.1)
Sodium: 139 mmol/L (ref 135–145)

## 2022-09-06 LAB — HEPATIC FUNCTION PANEL
ALT: 14 U/L (ref 0–44)
AST: 21 U/L (ref 15–41)
Albumin: 3.6 g/dL (ref 3.5–5.0)
Alkaline Phosphatase: 110 U/L (ref 38–126)
Bilirubin, Direct: 0.3 mg/dL — ABNORMAL HIGH (ref 0.0–0.2)
Indirect Bilirubin: 1.1 mg/dL — ABNORMAL HIGH (ref 0.3–0.9)
Total Bilirubin: 1.4 mg/dL — ABNORMAL HIGH (ref 0.3–1.2)
Total Protein: 7.4 g/dL (ref 6.5–8.1)

## 2022-09-06 LAB — LIPASE, BLOOD: Lipase: 27 U/L (ref 11–51)

## 2022-09-06 LAB — TROPONIN I (HIGH SENSITIVITY)
Troponin I (High Sensitivity): 40 ng/L — ABNORMAL HIGH (ref <18)
Troponin I (High Sensitivity): 43 ng/L — ABNORMAL HIGH (ref <18)

## 2022-09-06 MED ORDER — SUCRALFATE 1 GM/10ML PO SUSP: 1.0000 g | Freq: Three times a day (TID) | ORAL | 0 refills | Status: DC

## 2022-09-06 MED ORDER — SUCRALFATE 1 GM/10ML PO SUSP
1.0000 g | Freq: Once | ORAL | Status: AC
  Administered 2022-09-06: 1 g via ORAL
  Filled 2022-09-06: qty 10

## 2022-09-06 MED ORDER — ALUM & MAG HYDROXIDE-SIMETH 200-200-20 MG/5ML PO SUSP
30.0000 mL | Freq: Once | ORAL | Status: AC
  Administered 2022-09-06: 30 mL via ORAL
  Filled 2022-09-06: qty 30

## 2022-09-06 NOTE — Discharge Instructions (Addendum)
Whenever you take your pills, make sure that you take them with a full glass of water and do not lay down for at least 20 minutes after taking the medication.  You may take antacids such as Maalox, Mylanta, Pepto-Bismol as needed.  Please follow-up with the gastroenterologist.  Return if symptoms are getting worse.

## 2022-09-06 NOTE — ED Provider Notes (Signed)
[facility] Provider Note   CSN: [id number] Arrival date & time: 09/05/22  2308     History  Chief Complaint  Patient presents with   Chest Pain    [name] [name] is a 77 y.o. female.  The history is provided by the patient.  Chest Pain She has history of hypertension, diabetes, coronary artery disease with ischemic cardiomyopathy and heart failure, COPD, chronic kidney disease, gout, paroxysmal atrial fibrillation anticoagulated on rivaroxaban, permanent pacemaker and comes in because of pain in her central chest whenever she swallows.  When she swallows, pain will last up to about a minute before resolving.  There is no radiation of pain.  She denies dyspnea, nausea, diaphoresis.  She does complain of increased abdominal gas.  She denies any new medications.  She does admit that she is still smoking 1-2 cigarettes a day.   Home Medications Prior to Admission medications   Medication Sig Start Date End Date Taking? Authorizing Provider  acetaminophen (TYLENOL) 325 MG tablet Take 2 tablets (650 mg total) by mouth every 4 (four) hours as needed for headache or mild pain. 12/09/20   Mikhail, Velta Addison, DO  albuterol (VENTOLIN HFA) 108 (90 Base) MCG/ACT inhaler Inhale 1-2 puffs into the lungs every 6 (six) hours as needed for wheezing or shortness of breath. 03/20/22   Rising, Wells Guiles, PA-C  allopurinol (ZYLOPRIM) 100 MG tablet Take 100 mg by mouth daily as needed (gout symptoms). 07/19/21   [provider]  atorvastatin (LIPITOR) 10 MG tablet Take 1 tablet (10 mg total) by mouth at bedtime. 03/17/22   Janith Lima, MD  carboxymethylcellulose (REFRESH PLUS) 0.5 % SOLN Place 1 drop into both eyes daily as needed (dry eyes). Patient not taking: Reported on 08/12/2022    [provider]  hydrALAZINE (APRESOLINE) 25 MG tablet Take 1 tablet (25 mg total) by mouth 3 (three) times daily. 09/01/22 09/01/23  Janith Lima, MD  isosorbide  mononitrate (IMDUR) 30 MG 24 hr tablet Take 1 tablet (30 mg total) by mouth daily. 06/10/22 06/10/23  Domenic Polite, MD  losartan (COZAAR) 50 MG tablet TAKE ONE TABLET BY MOUTH DAILY 07/26/22   Bensimhon, Shaune Pascal, MD  metFORMIN (GLUCOPHAGE) 500 MG tablet Take 1 tablet (500 mg total) by mouth daily with breakfast. Daily with a meal 08/31/22   Janith Lima, MD  pantoprazole (PROTONIX) 40 MG tablet Take 1 tablet (40 mg total) by mouth daily. 04/14/22   Vanessa Kick, MD  Rivaroxaban (XARELTO) 15 MG TABS tablet Take 1 tablet (15 mg total) by mouth daily with supper. 08/18/22   Janith Lima, MD  spironolactone (ALDACTONE) 25 MG tablet TAKE ONE TABLET BY MOUTH DAILY 07/06/22   Rafael Bihari, FNP  torsemide (DEMADEX) 20 MG tablet Take 1 tablet (20 mg total) by mouth daily. 06/10/22   Domenic Polite, MD  Viviana Simpler Cleaster Corin 100-62.5-25 MCG/ACT AEPB Inhale ONE PUFF into THE lungs daily 06/27/22   Janith Lima, MD      Allergies    Bee venom, Ivp dye [iodinated contrast media], Ace inhibitors, Atenolol, Codeine, Entresto [sacubitril-valsartan], Penicillin g, Shrimp [shellfish allergy], Simvastatin, and Jardiance [empagliflozin]    Review of Systems   Review of Systems  Cardiovascular:  Positive for chest pain.  All other systems reviewed and are negative.   Physical Exam Updated Vital Signs BP (!) 170/104 (BP Location: Right Arm)   Pulse 86   Temp 98.5 F (36.9 C)   Resp  20   Ht '5\' 7"'$  (1.702 m)   Wt 81.2 kg   SpO2 98%   BMI 28.04 kg/m  Physical Exam Vitals and nursing note reviewed.   77 year old female, resting comfortably and in no acute distress. Vital signs are significant for elevated blood pressure. Oxygen saturation is 98%, which is normal. Head is normocephalic and atraumatic. PERRLA, EOMI. Oropharynx is clear. Neck is nontender and supple without adenopathy or JVD. Back is nontender and there is no CVA tenderness. Lungs are clear without rales, wheezes, or  rhonchi. Chest is nontender. Heart has an irregular rhythm without murmur. Abdomen is soft, flat, nontender. Extremities have no cyanosis or edema, full range of motion is present. Skin is warm and dry without rash. Neurologic: Mental status is normal, cranial nerves are intact, moves all extremities equally.  ED Results / Procedures / Treatments   Labs (all labs ordered are listed, but only abnormal results are displayed) Labs Reviewed  BASIC METABOLIC PANEL - Abnormal; Notable for the following components:      Result Value   CO2 20 (*)    Glucose, Bld 106 (*)    BUN 28 (*)    Creatinine, Ser 1.23 (*)    GFR, Estimated 45 (*)    All other components within normal limits  CBC - Abnormal; Notable for the following components:   RBC 5.68 (*)    Hemoglobin 16.4 (*)    HCT 48.9 (*)    RDW 19.1 (*)    Platelets 118 (*)    All other components within normal limits  HEPATIC FUNCTION PANEL - Abnormal; Notable for the following components:   Total Bilirubin 1.4 (*)    Bilirubin, Direct 0.3 (*)    Indirect Bilirubin 1.1 (*)    All other components within normal limits  TROPONIN I (HIGH SENSITIVITY) - Abnormal; Notable for the following components:   Troponin I (High Sensitivity) 43 (*)    All other components within normal limits  TROPONIN I (HIGH SENSITIVITY) - Abnormal; Notable for the following components:   Troponin I (High Sensitivity) 40 (*)    All other components within normal limits  LIPASE, BLOOD    EKG EKG Interpretation  Date/Time:  Monday September 05 2022 23:02:13 EST Ventricular Rate:  86 PR Interval:    QRS Duration: 98 QT Interval:  396 QTC Calculation: 473 R Axis:   44 Text Interpretation: ** Poor data quality, interpretation may be adversely affected Atrial fibrillation with frequent ventricular-paced complexes Minimal voltage criteria for LVH, may be normal variant ( Cornell product ) Cannot rule out Anterior infarct , age undetermined ST & T wave  abnormality, consider inferolateral ischemia Abnormal ECG When compared with ECG of 22-Jun-2022 15:43, ST-t abnormality is more pronounced Confirmed by Delora Fuel ([id number]) on 09/06/2022 12:31:19 AM  Radiology DG Abdomen Acute W/Chest  Result Date: 09/06/2022 CLINICAL DATA:  Chest and abdominal pain EXAM: DG ABDOMEN ACUTE WITH 1 VIEW CHEST COMPARISON:  06/07/2022, CT 12/23/2021 FINDINGS: Lungs are clear. No pneumothorax or pleural effusion. Coronary artery bypass grafting and left atrial clipping has been performed. Mild cardiomegaly is present. Left subclavian single lead pacemaker is unchanged. Pulmonary vascularity is normal. No acute bone abnormality within the thorax. Normal abdominal gas pattern. No free intraperitoneal gas. No organomegaly. Densely calcified fibroid noted within the pelvis. No acute bone abnormality. IMPRESSION: 1. Negative abdominal gas pattern. 2. No acute cardiopulmonary disease. 3. Mild cardiomegaly. Electronically Signed   By: Fidela Salisbury M.D.   On:  09/06/2022 01:07    Procedures Procedures  Cardiac monitor shows normal sinus rhythm, per my interpretation.  Medications Ordered in ED Medications - No data to display  ED Course/ Medical Decision Making/ A&P                             Medical Decision Making Amount and/or Complexity of Data Reviewed Labs: ordered.  Risk OTC drugs. Prescription drug management.   Chest pain when swallowing strongly suggestive of esophageal ulceration.  No history of recent new medication to suggest pill esophagitis.  I have reviewed and interpreted her electrocardiogram, and my interpretation is ST-T changes which are slightly more pronounced than ECG on 06-Jul-2022.  Abdominal and chest x-rays show mild cardiomegaly but no acute process.  Have independently viewed the images, and agree with the radiologist's interpretation.  I have reviewed and interpreted her laboratory test, and my interpretation is stable renal insufficiency  mildly elevated bilirubin with elevation both of direct and indirect bilirubin, polycythemia which is unchanged from prior, thrombocytopenia which is slightly worse than on 08/22/2022 but not at a dangerous level.  I have reviewed her past records, and she has had similar thrombocytopenia in the past.  Initial troponin is mildly elevated at 43, but in the past she has had troponins elevated to similar level.  I have a very low index of suspicion for ACS given the nature of her pain, but she will need a repeat troponin.  Also doubt pulmonary embolism, aortic dissection, pneumonia.  Repeat troponin is unchanged.  I ordered a dose of oral sucralfate, and she has noted improvement in her symptoms.  I am discharging her with a prescription for sucralfate solution and I am referring her to gastroenterology for follow-up.  She does admit that she will lie down right after taking medication, so I do still suspect pill esophagitis.  I have advised her to make sure she takes all of her pills with a full glass of water and to avoid laying down for 20 minutes following taking any medication.    Final Clinical Impression(s) / ED Diagnoses Final diagnoses:  Odynophagia  Elevated troponin  Renal insufficiency  Polycythemia  Chronic anticoagulation    Rx / DC Orders ED Discharge Orders          Ordered    sucralfate (CARAFATE) 1 GM/10ML suspension  3 times daily with meals & bedtime        09/06/22 7591              Delora Fuel, MD 63/84/66 [phone]

## 2022-09-15 ENCOUNTER — Telehealth: Payer: Self-pay | Admitting: Internal Medicine

## 2022-09-15 ENCOUNTER — Ambulatory Visit: Payer: HMO

## 2022-09-15 NOTE — Telephone Encounter (Signed)
Patient was in the office to reschedule wound check.   She stated she didn't have the procedure on 09/05/22 BIV ICD INSERTION CRT-D due to she had a bad cold.   Spoke with Frances Furbish and she wanted me to cancel the wound check and send a note to Merrilee Seashore to reschedule the procedure.   Patient is aware that Merrilee Seashore will call her back to reschedule the procedure.   She also stated she is still not feeling well from the cold she has.

## 2022-09-16 ENCOUNTER — Ambulatory Visit: Payer: Self-pay

## 2022-09-16 NOTE — Telephone Encounter (Signed)
error 

## 2022-09-20 DIAGNOSIS — J40 Bronchitis, not specified as acute or chronic: Secondary | ICD-10-CM | POA: Diagnosis not present

## 2022-09-20 DIAGNOSIS — R0602 Shortness of breath: Secondary | ICD-10-CM | POA: Diagnosis not present

## 2022-09-22 ENCOUNTER — Encounter (HOSPITAL_COMMUNITY): Payer: Self-pay | Admitting: *Deleted

## 2022-09-22 ENCOUNTER — Telehealth (HOSPITAL_COMMUNITY): Payer: Self-pay | Admitting: Emergency Medicine

## 2022-09-22 ENCOUNTER — Encounter (HOSPITAL_COMMUNITY): Payer: HMO

## 2022-09-22 NOTE — Telephone Encounter (Signed)
Called to remind of Paramedicine home visit tomorrow at 1:30 - LVM    Skipper Cliche [phone] 09/22/2022

## 2022-09-23 ENCOUNTER — Other Ambulatory Visit (HOSPITAL_COMMUNITY): Payer: Self-pay | Admitting: Emergency Medicine

## 2022-09-23 NOTE — Progress Notes (Signed)
Paramedicine Encounter    Patient ID: [name] [name], female    DOB: [birth year], 77 y.o.   MRN: [id number]   Complaints NONE  Assessment No chest pain or SOB.  No edema noted and lung sounds with some mild ronchi in the bases.  Compliance with meds Compliant per pt.  Pill box filled N/A  Refills needed None  Meds changes since last visit None    Social changes None   BP (!) 160/90 (BP Location: Left Arm, Patient Position: Sitting, Cuff Size: Normal)   Resp 16   Wt 186 lb 6.4 oz (84.6 kg)   BMI 29.19 kg/m  Weight yesterday-not taken Last visit weight-182lb Cbg93  [name] [name] is her normal feisty self.  She has no complaints today.  She did have her BIV ICD procedure done due to being sick with what she describes as a cold.  She states she still plans on having the procedure  but needs to reschedule it.  My assessment of the situation would be she is stalling.   It is difficult to tell how compliant she is with her meds as she takes them from the bottles and does not wish to use a pill box so I have no totally accurate measure of her med compliance.  She is having issues affording her Xarelto and Dr. Ronnald Ramp' office has provided her with the forms to fill out for financial assistance from [facility] which she has not totally completed.  She needs to get a copy of her 1040 form, Rx list, and a copy of the front and back of her insurance card.  She also mentioned being in the donut hole with her other meds.   She has agreed for a follow up visit with paramedicine 2/16 @ 2:30.  ACTION: Home visit completed  Skipper Cliche [id number] 09/23/22  Patient Care Team: Janith Lima, MD as PCP - General (Internal Medicine) Jerline Pain, MD as PCP - Cardiology (Cardiology) Evans Lance, MD as PCP - Electrophysiology (Cardiology) [name], MD as Consulting Physician (Cardiology)  Patient Active Problem List   Diagnosis Date Noted   Ceruminosis,  bilateral 06/17/2022   Flu vaccine need 06/17/2022   Chronic combined systolic and diastolic CHF (congestive heart failure) ([place]) 06/17/2022   Encounter for general adult medical examination with abnormal findings 06/16/2022   Chronic anticoagulation 06/06/2022   Severe mitral regurgitation 06/06/2022   Controlled type 2 diabetes mellitus without complication, without long-term current use of insulin ([place]) 06/06/2022   Pacemaker 02/22/2022   Obstructive sleep apnea 09/21/2020   Obesity (BMI 30.0-34.9) 02/10/2020   COPD (chronic obstructive pulmonary disease) ([place]) 10/26/2019   Thrombocytopenia ([place]) 09/14/2017   Medication noncompliance due to cognitive impairment 09/06/2017   Persistent atrial fibrillation ([place]) 08/04/2017   Paroxysmal atrial fibrillation ([place]) 02/08/2017   Cardiomyopathy, ischemic 02/08/2017   CKD (chronic kidney disease), stage III ([place]) 01/30/2017   Coronary artery disease due to lipid rich plaque    Essential hypertension     Current Outpatient Medications:    acetaminophen (TYLENOL) 325 MG tablet, Take 2 tablets (650 mg total) by mouth every 4 (four) hours as needed for headache or mild pain., Disp: , Rfl:    albuterol (VENTOLIN HFA) 108 (90 Base) MCG/ACT inhaler, Inhale 1-2 puffs into the lungs every 6 (six) hours as needed for wheezing or shortness of breath., Disp: 18 g, Rfl: 2   allopurinol (ZYLOPRIM) 100 MG tablet, Take 100 mg by mouth daily as  needed (gout symptoms)., Disp: , Rfl:    atorvastatin (LIPITOR) 10 MG tablet, Take 1 tablet (10 mg total) by mouth at bedtime., Disp: 90 tablet, Rfl: 1   carboxymethylcellulose (REFRESH PLUS) 0.5 % SOLN, Place 1 drop into both eyes daily as needed (dry eyes)., Disp: , Rfl:    hydrALAZINE (APRESOLINE) 25 MG tablet, Take 1 tablet (25 mg total) by mouth 3 (three) times daily., Disp: 270 tablet, Rfl: 1   losartan (COZAAR) 50 MG tablet, TAKE ONE TABLET BY MOUTH DAILY, Disp: 30 tablet, Rfl: 6   metFORMIN (GLUCOPHAGE) 500 MG  tablet, Take 1 tablet (500 mg total) by mouth daily with breakfast. Daily with a meal, Disp: 90 tablet, Rfl: 0   pantoprazole (PROTONIX) 40 MG tablet, Take 1 tablet (40 mg total) by mouth daily., Disp: 30 tablet, Rfl: 2   Rivaroxaban (XARELTO) 15 MG TABS tablet, Take 1 tablet (15 mg total) by mouth daily with supper., Disp: 90 tablet, Rfl: 0   spironolactone (ALDACTONE) 25 MG tablet, TAKE ONE TABLET BY MOUTH DAILY, Disp: 30 tablet, Rfl: 11   sucralfate (CARAFATE) 1 GM/10ML suspension, Take 10 mLs (1 g total) by mouth 4 (four) times daily -  with meals and at bedtime., Disp: 420 mL, Rfl: 0   torsemide (DEMADEX) 20 MG tablet, Take 1 tablet (20 mg total) by mouth daily., Disp: 30 tablet, Rfl: 1   isosorbide mononitrate (IMDUR) 30 MG 24 hr tablet, Take 1 tablet (30 mg total) by mouth daily. (Patient not taking: Reported on 09/23/2022), Disp: 30 tablet, Rfl: 0   TRELEGY ELLIPTA 100-62.5-25 MCG/ACT AEPB, Inhale ONE PUFF into THE lungs daily (Patient not taking: Reported on 09/23/2022), Disp: 120 each, Rfl: 1 Allergies  Allergen Reactions   Bee Venom Anaphylaxis   Ivp Dye [Iodinated Contrast Media] Anaphylaxis   Ace Inhibitors     Other reaction(s): angioedema   Atenolol     Other reaction(s): severe headaches   Codeine Nausea And Vomiting   Entresto [Sacubitril-Valsartan] Other (See Comments)    Chest pain    Penicillin G     Other reaction(s): rash   Shrimp [Shellfish Allergy] Swelling   Simvastatin     Other reaction(s): not sure   Jardiance [Empagliflozin] Other (See Comments)    Caused Boils      Social History   Socioeconomic History   Marital status: Widowed    Spouse name: Not on file   Number of children: 1   Years of education: 13   Highest education level: Not on file  Occupational History   Occupation: RETIRED [employer]  Tobacco Use   Smoking status: Former    Packs/day: 0.50    Years: 56.00    Total pack years: 28.00    Types: Cigarettes    Quit date:  11/13/2021    Years since quitting: 0.8   Smokeless tobacco: Never   Tobacco comments:    smoking 2 cigarettes/day as of 10/23/20  Vaping Use   Vaping Use: Never used  Substance and Sexual Activity   Alcohol use: No    Alcohol/week: 0.0 standard drinks of alcohol   Drug use: No   Sexual activity: Not on file  Other Topics Concern   Not on file  Social History Narrative   Patient reports it being difficult to pay for everything due to outstanding medical bills from hospital stay last year but states she is able to keep up with basic expenses.  Patient does have some concerns with her house's  condition due to a water leak- had her roof repaired last month but has some leaking in the front which has affected her porch.  Patient owns her own car and is able to drive herself but has some concerns about the reliability of her car- has gotten taxi to the clinic for appointment in the past when her car broke down.   Social Determinants of Health   Financial Resource Strain: Low Risk  (06/07/2018)   Overall Financial Resource Strain (CARDIA)    Difficulty of Paying Living Expenses: Not hard at all  Recent Concern: Financial Resource Strain - Medium Risk (05/21/2018)   Overall Financial Resource Strain (CARDIA)    Difficulty of Paying Living Expenses: Somewhat hard  Food Insecurity: No Food Insecurity (06/09/2022)   Hunger Vital Sign    Worried About Running Out of Food in the Last Year: Never true    [place] in the Last Year: Never true  Transportation Needs: No Transportation Needs (06/09/2022)   PRAPARE - Hydrologist (Medical): No    Lack of Transportation (Non-Medical): No  Physical Activity: Not on file  Stress: Not on file  Social Connections: Not on file  Intimate Partner Violence: Not At Risk (06/09/2022)   Humiliation, Afraid, Rape, and Kick questionnaire    Fear of Current or Ex-Partner: No    Emotionally Abused: No    Physically Abused: No     Sexually Abused: No    Physical Exam      Future Appointments  Date Time Provider [place]  10/18/2022  7:00 AM [facility] DEVICE REMOTES [facility]  12/08/2022  1:45 PM Evans Lance, MD [facility]  01/17/2023  7:00 AM [facility] DEVICE REMOTES [facility]  04/18/2023  7:05 AM [facility] DEVICE REMOTES [facility]  07/18/2023  7:00 AM [facility] DEVICE REMOTES [facility]  10/17/2023  7:00 AM [facility] DEVICE REMOTES [facility]

## 2022-09-27 ENCOUNTER — Telehealth: Payer: Self-pay | Admitting: Internal Medicine

## 2022-09-27 DIAGNOSIS — I4819 Other persistent atrial fibrillation: Secondary | ICD-10-CM

## 2022-09-27 MED ORDER — RIVAROXABAN 15 MG PO TABS: 15.0000 mg | ORAL_TABLET | Freq: Every day | ORAL | 0 refills | Status: DC

## 2022-09-27 MED ORDER — PANTOPRAZOLE SODIUM 40 MG PO TBEC: 40.0000 mg | DELAYED_RELEASE_TABLET | Freq: Every day | ORAL | 0 refills | Status: DC

## 2022-09-27 NOTE — Telephone Encounter (Signed)
Sent 90 day to [facility]. Pt is due for f/u in March.Marland KitchenJohny [name]

## 2022-09-27 NOTE — Telephone Encounter (Signed)
MEDICATION: Rivaroxaban (XARELTO) 15 MG TABS tablet  pantoprazole (PROTONIX) 40 MG tablet  PHARMACY: [facility] [address] in [place]  Comments: Patient said pharmacy needs permission from provider to access her medication list to refill as needed  **Let patient know to contact pharmacy at the end of the day to make sure medication is ready. **  ** Please notify patient to allow 48-72 hours to process**  **Encourage patient to contact the pharmacy for refills or they can request refills through [facility]**

## 2022-09-30 ENCOUNTER — Other Ambulatory Visit: Payer: Self-pay | Admitting: Internal Medicine

## 2022-09-30 ENCOUNTER — Telehealth: Payer: Self-pay | Admitting: Internal Medicine

## 2022-09-30 DIAGNOSIS — I4819 Other persistent atrial fibrillation: Secondary | ICD-10-CM

## 2022-09-30 MED ORDER — RIVAROXABAN 15 MG PO TABS: 15.0000 mg | ORAL_TABLET | Freq: Every day | ORAL | 0 refills | Status: DC

## 2022-09-30 NOTE — Telephone Encounter (Signed)
Patient needs a refill on her Xarelto sent to [facility] on [address].

## 2022-10-06 ENCOUNTER — Telehealth: Payer: Self-pay | Admitting: Internal Medicine

## 2022-10-06 NOTE — Telephone Encounter (Signed)
Patient said she ran out of Xarelto. She said it's too expensive at the pharmacy and would like to know if there are other options or ways to make it cheaper. Best callback is [phone].

## 2022-10-06 NOTE — Telephone Encounter (Signed)
I have informed pt that I have samples for her to pick up located at the front desk.

## 2022-10-07 ENCOUNTER — Telehealth (HOSPITAL_COMMUNITY): Payer: Self-pay | Admitting: Emergency Medicine

## 2022-10-07 NOTE — Telephone Encounter (Signed)
Called @ 12:08 to schedule home visit.  She advised she wasn't even out of bed yet and hadn't had a shower.  She said she had things to do today and so we scheduled a visit Tuesday @ 1:00.    [name], [place] 10/07/2022

## 2022-10-11 ENCOUNTER — Ambulatory Visit: Payer: Medicare Other | Admitting: Podiatry

## 2022-10-11 ENCOUNTER — Other Ambulatory Visit: Payer: Self-pay

## 2022-10-11 DIAGNOSIS — E785 Hyperlipidemia, unspecified: Secondary | ICD-10-CM

## 2022-10-11 MED ORDER — LOSARTAN POTASSIUM 50 MG PO TABS: 50.0000 mg | ORAL_TABLET | Freq: Every day | ORAL | 6 refills | Status: DC

## 2022-10-11 MED ORDER — TORSEMIDE 20 MG PO TABS: 20.0000 mg | ORAL_TABLET | Freq: Every day | ORAL | 1 refills | Status: DC

## 2022-10-11 MED ORDER — ATORVASTATIN CALCIUM 10 MG PO TABS: 10.0000 mg | ORAL_TABLET | Freq: Every day | ORAL | 1 refills | Status: DC

## 2022-10-11 MED ORDER — ACETAMINOPHEN 325 MG PO TABS: 650.0000 mg | ORAL_TABLET | ORAL | Status: DC | PRN

## 2022-10-11 MED ORDER — SPIRONOLACTONE 25 MG PO TABS: 25.0000 mg | ORAL_TABLET | Freq: Every day | ORAL | 11 refills | Status: DC

## 2022-10-12 NOTE — Telephone Encounter (Signed)
Pt has been rescheduled to 11/21/22 @ 11:30 for her BiV-ICD upgrade with Dr. Lovena Le.   I will reach out to Paramedic [name] to update her on this per pt's request. Hopefully, she can help out with getting pt to have her labs done.   She stated that her Daughter may have questions regarding the procedure. I advised her to have her Daughter call our office and ask to speak with Dr. Tanna Furry RN.

## 2022-10-13 ENCOUNTER — Ambulatory Visit: Payer: Medicare Other | Admitting: Podiatry

## 2022-10-13 DIAGNOSIS — G629 Polyneuropathy, unspecified: Secondary | ICD-10-CM

## 2022-10-13 DIAGNOSIS — L600 Ingrowing nail: Secondary | ICD-10-CM

## 2022-10-13 DIAGNOSIS — I739 Peripheral vascular disease, unspecified: Secondary | ICD-10-CM

## 2022-10-13 MED ORDER — GABAPENTIN 100 MG PO CAPS: 100.0000 mg | ORAL_CAPSULE | Freq: Every day | ORAL | 0 refills | Status: DC

## 2022-10-13 NOTE — Progress Notes (Signed)
Subjective:   Patient ID: [name] [name], female   DOB: 77 y.o.   MRN: [id number]   HPI Chief Complaint  Patient presents with   Numbness    Patient is concern about numbness to bilateral toes and lateral aspect of right foot. Patient recently diagnosed with diabetes and she does check her blood sugar but has not had her A1C checked.     77 year old female presents the office today with above concerns.  She states that she started to notice numbness about a month ago. She staes it does not feel like "blood is going through all my vessels". She feels an itching sensation in both sides and it feels like a "mosquito" bite in both feet. When she is driving or in an office she has to stop and itch her feet. She had a pedicure a few weeks ago and she had an ingrown toenail but they left soft and she trimmed it out herself.   A1c 6.2 on 8/2  Review of Systems  All other systems reviewed and are negative.  Past Medical History:  Diagnosis Date   ([place]) heart failure with improved ejection fraction ([place])    a. 07/2018 Echo: EF 30-35%; b. 11/2019 Echo: EF 55-60%, no rwma, Gr2 DD, Nl RV size/fxn. Mild BAE. Mild MR/AI.   Arthritis    Asthma    Atopic dermatitis    CAD (coronary artery disease)    a. 2016 s/p CABG x 2 (LIMA->LAD, VG->OM); b. 08/2018 MV: EF 44%, no ischemia/infact.   Cardiac arrest ([place]) 10/2014   Cardiomyopathy, ischemic    a. 07/2018 Echo: EF 30-35%; 11/2019 Echo: EF 55-60%.   Carotid arterial disease ([place])    a. 11/2019 Carotid U/S   CHF (congestive heart failure) (HCC)    CKD (chronic kidney disease), stage III (HCC)    COPD (chronic obstructive pulmonary disease) (HCC)    Diabetes mellitus without complication ([place])    Dysrhythmia    Esophageal dilatation 2013   GERD (gastroesophageal reflux disease)    Glaucoma    Gout    Headache    Hypertension    Hypokalemia    Idiopathic angioedema    LGI bleed 08/06/2017   a. felt to be hemorrhoidal during that admission (no  drop in Hgb).   Lower back pain    Paroxysmal atrial fibrillation (HCC)    a. Dx 2016-->h/o difficult to control rates (complicated by noncompliance), not felt to be a candidate for ablation or antiarrhythmic due to noncompliance; b. Recurrent AF 2021 - converted w/ IV dilt; c. CHA2DS2VASc = 7-->Xarelto.   Personal history of noncompliance with medical treatment, presenting hazards to health    Prediabetes    Presence of permanent cardiac pacemaker    S/P CABG x 2 with clipping of LA appendage 11/14/2014   LIMA to LAD, SVG to OM, EVH via right thigh   Urine incontinence    Uterine fibroid     Past Surgical History:  Procedure Laterality Date   AV NODE ABLATION N/A 01/18/2021   Procedure: AV NODE ABLATION;  Surgeon: Evans Lance, MD;  Location: [place] [facility];  Service: Cardiovascular;  Laterality: N/A;   BALLOON DILATION N/A 10/10/2019   Procedure: BALLOON DILATION;  Surgeon: Otis Brace, MD;  Location: [facility];  Service: Gastroenterology;  Laterality: N/A;   BIOPSY  10/10/2019   Procedure: BIOPSY;  Surgeon: Otis Brace, MD;  Location: [place];  Service: Gastroenterology;;   BIOPSY  10/11/2019   Procedure: BIOPSY;  Surgeon: Otis Brace, MD;  Location: [place];  Service: Gastroenterology;;   CARDIOVERSION N/A 11/18/2014   Procedure: CARDIOVERSION;  Surgeon: [name], MD;  Location: [place];  Service: Cardiovascular;  Laterality: N/A;   CARDIOVERSION N/A 12/25/2017   Procedure: CARDIOVERSION;  Surgeon: [name], MD;  Location: [place];  Service: Cardiovascular;  Laterality: N/A;   CLIPPING OF ATRIAL APPENDAGE N/A 11/14/2014   Procedure: CLIPPING OF ATRIAL APPENDAGE;  Surgeon: [name], MD;  Location: [place];  Service: Open Heart Surgery;  Laterality: N/A;   COLONOSCOPY  2013   COLONOSCOPY WITH PROPOFOL N/A 10/11/2019   Procedure: COLONOSCOPY WITH PROPOFOL;  Surgeon: Otis Brace, MD;  Location: [place];   Service: Gastroenterology;  Laterality: N/A;   CORONARY ARTERY BYPASS GRAFT N/A 11/14/2014   Procedure: CORONARY ARTERY BYPASS GRAFTING (CABG)TIMES 2 USING LEFT INTERNAL MAMMARY ARTERY AND RIGHT SAPHENOUS VEIN HARVESTED ENDOSCOPICALLY;  Surgeon: [name], MD;  Location: [place];  Service: Open Heart Surgery;  Laterality: N/A;   ESOPHAGOGASTRODUODENOSCOPY (EGD) WITH PROPOFOL N/A 10/10/2019   Procedure: ESOPHAGOGASTRODUODENOSCOPY (EGD) WITH PROPOFOL;  Surgeon: Otis Brace, MD;  Location: [place];  Service: Gastroenterology;  Laterality: N/A;   ESOPHAGOGASTRODUODENOSCOPY (EGD) WITH PROPOFOL N/A 10/27/2019   Procedure: ESOPHAGOGASTRODUODENOSCOPY (EGD) WITH PROPOFOL;  Surgeon: Ronnette Juniper, MD;  Location: [place];  Service: Gastroenterology;  Laterality: N/A;   GIVENS CAPSULE STUDY N/A 10/27/2019   Procedure: GIVENS CAPSULE STUDY;  Surgeon: Ronnette Juniper, MD;  Location: [place];  Service: Gastroenterology;  Laterality: N/A;   INSERT / REPLACE / REMOVE PACEMAKER     LEFT HEART CATHETERIZATION WITH CORONARY ANGIOGRAM N/A 11/04/2014   Procedure: LEFT HEART CATHETERIZATION WITH CORONARY ANGIOGRAM;  Surgeon: Troy Sine, MD;  Location: [facility] CATH LAB;  Service: Cardiovascular;  Laterality: N/A;   PACEMAKER IMPLANT N/A 01/18/2021   Procedure: PACEMAKER IMPLANT;  Surgeon: Evans Lance, MD;  Location: [place] [facility];  Service: Cardiovascular;  Laterality: N/A;   PACEMAKER IMPLANT Left    POLYPECTOMY  10/11/2019   Procedure: POLYPECTOMY;  Surgeon: Otis Brace, MD;  Location: [place] [facility];  Service: Gastroenterology;;   RIGHT HEART CATH N/A 06/08/2022   Procedure: RIGHT HEART CATH;  Surgeon: [name], MD;  Location: [place] [facility];  Service: Cardiovascular;  Laterality: N/A;   TEE WITHOUT CARDIOVERSION N/A 11/14/2014   Procedure: TRANSESOPHAGEAL ECHOCARDIOGRAM (TEE);  Surgeon: [name], MD;  Location: [place];  Service: Open Heart Surgery;  Laterality:  N/A;   TEE WITHOUT CARDIOVERSION N/A 12/25/2017   Procedure: TRANSESOPHAGEAL ECHOCARDIOGRAM (TEE);  Surgeon: [name], MD;  Location: [facility] ENDOS[facility];  Service: Cardiovascular;  Laterality: N/A;   TEMPORARY PACEMAKER INSERTION  11/04/2014   Procedure: TEMPORARY PACEMAKER INSERTION;  Surgeon: Troy Sine, MD;  Location: [facility] CATH LAB;  Service: Cardiovascular;;   TOOTH EXTRACTION       Current Outpatient Medications:    gabapentin (NEURONTIN) 100 MG capsule, Take 1 capsule (100 mg total) by mouth at bedtime., Disp: 90 capsule, Rfl: 0   acetaminophen (TYLENOL) 325 MG tablet, Take 2 tablets (650 mg total) by mouth every 4 (four) hours as needed for headache or mild pain., Disp: , Rfl:    albuterol (VENTOLIN HFA) 108 (90 Base) MCG/ACT inhaler, Inhale 1-2 puffs into the lungs every 6 (six) hours as needed for wheezing or shortness of breath., Disp: 18 g, Rfl: 2   allopurinol (ZYLOPRIM) 100 MG tablet, Take 100 mg by mouth daily as needed (gout symptoms)., Disp: ,  Rfl:    atorvastatin (LIPITOR) 10 MG tablet, Take 1 tablet (10 mg total) by mouth at bedtime., Disp: 90 tablet, Rfl: 1   carboxymethylcellulose (REFRESH PLUS) 0.5 % SOLN, Place 1 drop into both eyes daily as needed (dry eyes)., Disp: , Rfl:    hydrALAZINE (APRESOLINE) 25 MG tablet, Take 1 tablet (25 mg total) by mouth 3 (three) times daily., Disp: 270 tablet, Rfl: 1   isosorbide mononitrate (IMDUR) 30 MG 24 hr tablet, Take 1 tablet (30 mg total) by mouth daily. (Patient not taking: Reported on 09/23/2022), Disp: 30 tablet, Rfl: 0   losartan (COZAAR) 50 MG tablet, Take 1 tablet (50 mg total) by mouth daily., Disp: 30 tablet, Rfl: 6   metFORMIN (GLUCOPHAGE) 500 MG tablet, Take 1 tablet (500 mg total) by mouth daily with breakfast. Daily with a meal, Disp: 90 tablet, Rfl: 0   pantoprazole (PROTONIX) 40 MG tablet, Take 1 tablet (40 mg total) by mouth daily. Follow-up appt due in [place] must see provider for future refills, Disp: 90 tablet,  Rfl: 0   Rivaroxaban (XARELTO) 15 MG TABS tablet, Take 1 tablet (15 mg total) by mouth daily with supper. Follow-up appt due in March, Disp: 90 tablet, Rfl: 0   spironolactone (ALDACTONE) 25 MG tablet, Take 1 tablet (25 mg total) by mouth daily., Disp: 30 tablet, Rfl: 11   sucralfate (CARAFATE) 1 GM/10ML suspension, Take 10 mLs (1 g total) by mouth 4 (four) times daily -  with meals and at bedtime., Disp: 420 mL, Rfl: 0   torsemide (DEMADEX) 20 MG tablet, Take 1 tablet (20 mg total) by mouth daily., Disp: 30 tablet, Rfl: 1   TRELEGY ELLIPTA 100-62.5-25 MCG/ACT AEPB, Inhale ONE PUFF into THE lungs daily (Patient not taking: Reported on 09/23/2022), Disp: 120 each, Rfl: 1  Allergies  Allergen Reactions   Bee Venom Anaphylaxis   Ivp Dye [Iodinated Contrast Media] Anaphylaxis   Ace Inhibitors     Other reaction(s): angioedema   Atenolol     Other reaction(s): severe headaches   Codeine Nausea And Vomiting   Entresto [Sacubitril-Valsartan] Other (See Comments)    Chest pain    Penicillin G     Other reaction(s): rash   Shrimp [Shellfish Allergy] Swelling   Simvastatin     Other reaction(s): not sure   Jardiance [Empagliflozin] Other (See Comments)    Caused Boils           Objective:  Physical Exam  General: AAO x3, NAD  Dermatological: There are no open lesions, bite marks or any excoriations on the feet.  There is no fluctuance or crepitation.  No malodor.  There is mild incurvation of nail border without any edema, erythema or signs of infection.  There are no open lesions.  Vascular: Dorsalis Pedis artery and Posterior Tibial artery pedal pulses are palpable bilateral with immedate capillary fill time. There is no pain with calf compression, swelling, warmth, erythema.   Neruologic: Sensation mildly decreased with Semmes Weinstein monofilament.  Musculoskeletal: No area pinpoint tenderness noted.  Flexor, extensor, grip intact.  MMT 5/5.  Gait: Unassisted, Nonantalgic.        Assessment:   77 year old female with likely neuropathy, ingrown toenail     Plan:  -Treatment options discussed including all alternatives, risks, and complications -Etiology of symptoms were discussed -Given her symptoms and MRI ordered ABI to rule out any arterial insufficiency that could be contributory symptoms.  I think more of her symptoms are neurological in nature.  I  do not appreciate any open sores or any bite marks. There are no signs of infection.  We discussed gabapentin we will start this at a low dose and titrate up if needed. -Sharply debrided the ingrown toenail without any complications or bleeding.  Consider partial nail avulsion if needed.  Trula Slade DPM

## 2022-10-13 NOTE — Patient Instructions (Signed)
Gabapentin Capsules or Tablets What is this medication? GABAPENTIN (GA ba pen tin) treats nerve pain. It may also be used to prevent and control seizures in people with epilepsy. It works by calming overactive nerves in your body. This medicine may be used for other purposes; ask your health care provider or pharmacist if you have questions. COMMON BRAND NAME(S): Active-PAC with Gabapentin, Gabarone, Gralise, Neurontin What should I tell my care team before I take this medication? They need to know if you have any of these conditions: Alcohol or substance use disorder Kidney disease Lung or breathing disease Suicidal thoughts, plans, or attempt; a previous suicide attempt by you or a family member An unusual or allergic reaction to gabapentin, other medications, foods, dyes, or preservatives Pregnant or trying to get pregnant Breast-feeding How should I use this medication? Take this medication by mouth with a glass of water. Follow the directions on the prescription label. You can take it with or without food. If it upsets your stomach, take it with food. Take your medication at regular intervals. Do not take it more often than directed. Do not stop taking except on your care team's advice. If you are directed to break the 600 or 800 mg tablets in half as part of your dose, the extra half tablet should be used for the next dose. If you have not used the extra half tablet within 28 days, it should be thrown away. A special MedGuide will be given to you by the pharmacist with each prescription and refill. Be sure to read this information carefully each time. Talk to your care team about the use of this medication in children. While this medication may be prescribed for children as young as 3 years for selected conditions, precautions do apply. Overdosage: If you think you have taken too much of this medicine contact a poison control center or emergency room at once. NOTE: This medicine is only for  you. Do not share this medicine with others. What if I miss a dose? If you miss a dose, take it as soon as you can. If it is almost time for your next dose, take only that dose. Do not take double or extra doses. What may interact with this medication? Alcohol Antihistamines for allergy, cough, and cold Certain medications for anxiety or sleep Certain medications for depression like amitriptyline, fluoxetine, sertraline Certain medications for seizures like phenobarbital, primidone Certain medications for stomach problems General anesthetics like halothane, isoflurane, methoxyflurane, propofol Local anesthetics like lidocaine, pramoxine, tetracaine Medications that relax muscles for surgery Opioid medications for pain Phenothiazines like chlorpromazine, mesoridazine, prochlorperazine, thioridazine This list may not describe all possible interactions. Give your health care provider a list of all the medicines, herbs, non-prescription drugs, or dietary supplements you use. Also tell them if you smoke, drink alcohol, or use illegal drugs. Some items may interact with your medicine. What should I watch for while using this medication? Visit your care team for regular checks on your progress. You may want to keep a record at home of how you feel your condition is responding to treatment. You may want to share this information with your care team at each visit. You should contact your care team if your seizures get worse or if you have any new types of seizures. Do not stop taking this medication or any of your seizure medications unless instructed by your care team. Stopping your medication suddenly can increase your seizures or their severity. This medication may cause serious skin   reactions. They can happen weeks to months after starting the medication. Contact your care team right away if you notice fevers or flu-like symptoms with a rash. The rash may be red or purple and then turn into blisters or  peeling of the skin. Or, you might notice a red rash with swelling of the face, lips or lymph nodes in your neck or under your arms. Wear a medical identification bracelet or chain if you are taking this medication for seizures. Carry a card that lists all your medications. This medication may affect your coordination, reaction time, or judgment. Do not drive or operate machinery until you know how this medication affects you. Sit up or stand slowly to reduce the risk of dizzy or fainting spells. Drinking alcohol with this medication can increase the risk of these side effects. Your mouth may get dry. Chewing sugarless gum or sucking hard candy, and drinking plenty of water may help. Watch for new or worsening thoughts of suicide or depression. This includes sudden changes in mood, behaviors, or thoughts. These changes can happen at any time but are more common in the beginning of treatment or after a change in dose. Call your care team right away if you experience these thoughts or worsening depression. If you become pregnant while using this medication, you may enroll in the [place] Antiepileptic Drug Pregnancy Registry by calling 1-888-233-2334. This registry collects information about the safety of antiepileptic medication use during pregnancy. What side effects may I notice from receiving this medication? Side effects that you should report to your care team as soon as possible: Allergic reactions or angioedema--skin rash, itching, hives, swelling of the face, eyes, lips, tongue, arms, or legs, trouble swallowing or breathing Rash, fever, and swollen lymph nodes Thoughts of suicide or self harm, worsening mood, feelings of depression Trouble breathing Unusual changes in mood or behavior in children after use such as difficulty concentrating, hostility, or restlessness Side effects that usually do not require medical attention (report to your care team if they continue or are  bothersome): Dizziness Drowsiness Nausea Swelling of ankles, feet, or hands Vomiting This list may not describe all possible side effects. Call your doctor for medical advice about side effects. You may report side effects to FDA at 1-800-FDA-1088. Where should I keep my medication? Keep out of reach of children and pets. Store at room temperature between 15 and 30 degrees C (59 and 86 degrees F). Get rid of any unused medication after the expiration date. This medication may cause accidental overdose and death if taken by other adults, children, or pets. To get rid of medications that are no longer needed or have expired: Take the medication to a medication take-back program. Check with your pharmacy or law enforcement to find a location. If you cannot return the medication, check the label or package insert to see if the medication should be thrown out in the garbage or flushed down the toilet. If you are not sure, ask your care team. If it is safe to put it in the trash, empty the medication out of the container. Mix the medication with cat litter, dirt, coffee grounds, or other unwanted substance. Seal the mixture in a bag or container. Put it in the trash. NOTE: This sheet is a summary. It may not cover all possible information. If you have questions about this medicine, talk to your doctor, pharmacist, or health care provider.  2023 Elsevier/Gold Standard (2021-02-01 00:00:00)  

## 2022-10-18 ENCOUNTER — Other Ambulatory Visit: Payer: Self-pay

## 2022-10-18 ENCOUNTER — Ambulatory Visit (INDEPENDENT_AMBULATORY_CARE_PROVIDER_SITE_OTHER): Payer: Medicare Other

## 2022-10-18 DIAGNOSIS — I255 Ischemic cardiomyopathy: Secondary | ICD-10-CM | POA: Diagnosis not present

## 2022-10-18 DIAGNOSIS — I480 Paroxysmal atrial fibrillation: Secondary | ICD-10-CM

## 2022-10-18 DIAGNOSIS — I5042 Chronic combined systolic (congestive) and diastolic (congestive) heart failure: Secondary | ICD-10-CM

## 2022-10-19 DIAGNOSIS — R0602 Shortness of breath: Secondary | ICD-10-CM | POA: Diagnosis not present

## 2022-10-19 DIAGNOSIS — J40 Bronchitis, not specified as acute or chronic: Secondary | ICD-10-CM | POA: Diagnosis not present

## 2022-10-19 LAB — CUP PACEART REMOTE DEVICE CHECK
Battery Voltage: 85
Date Time Interrogation Session: 20240305073235
Implantable Lead Connection Status: 753985
Implantable Lead Implant Date: 20220606
Implantable Lead Location: 753860
Implantable Lead Model: 377169
Implantable Lead Serial Number: 8000396500
Implantable Pulse Generator Implant Date: 20220606
Pulse Gen Model: 407145
Pulse Gen Serial Number: 70105656

## 2022-10-21 ENCOUNTER — Other Ambulatory Visit (HOSPITAL_COMMUNITY): Payer: Self-pay | Admitting: Emergency Medicine

## 2022-10-21 NOTE — Progress Notes (Signed)
Paramedicine Encounter    Patient ID: [name] [name], female    DOB: February 12, 1946, 77 y.o.   MRN: [id number]   Complaints None   Assessment No chest pain, no SOB, lung sounds clear throughout.  No peripheral edema.  Compliance with meds unable to confirm  Pill box filled n/a  Refills needed n/a  Meds changes since last visit n/a    Social changes n/a   There were no vitals taken for this visit. Weight yesterday-not taken Last visit weight-186lb  ATF [name] A&O x 4, skin W&D w/ good color.  She reports today to be feeling well.  She denies chest pain or SOB.  Lung sounds clear.  No peripheral edema noted.  She does not allow me to do her medications and she takes them out of bottles and not pill box or bubble packs.  She tells me she takes her meds like they are prescribed but I have know way to actually confirm that.  Today I provided her a printed list of her medications and I reviewed each med and what they were for just as a general review. The primary focus of our conversation today was her upcoming [place] Procedure with Dr. Lovena Le.  She had a prior appointment for this procedure but cancelled because she says she was "sick".  She tells me today, "I hope I can go through with this this time, because I'm really scared."  We had quite a long discussion about the benefits of the procedure and what to expect.  I spoke with April Vale for Dr. Tanna Furry office earlier this week and we reviewed upcoming appointments  of Lab visit on 3/5, HF Clinic visit 3/15 @ 2:30, Procedure visit 4/8 @ 9:15 Entrance A.   I made her a set of post it notes with all the information on them so she could post them on her calendar or refrigerator.  ACTION: Home visit completed  Skipper Cliche [id number] 10/21/22  Patient Care Team: Janith Lima, MD as PCP - General (Internal Medicine) Jerline Pain, MD as PCP - Cardiology (Cardiology) Evans Lance, MD as PCP -  Electrophysiology (Cardiology) [name], MD as Consulting Physician (Cardiology)  Patient Active Problem List   Diagnosis Date Noted   Ceruminosis, bilateral 06/17/2022   Flu vaccine need 06/17/2022   Chronic combined systolic and diastolic CHF (congestive heart failure) ([place]) 06/17/2022   Encounter for general adult medical examination with abnormal findings 06/16/2022   Chronic anticoagulation 06/06/2022   Severe mitral regurgitation 06/06/2022   Controlled type 2 diabetes mellitus without complication, without long-term current use of insulin ([place]) 06/06/2022   Pacemaker 02/22/2022   Obstructive sleep apnea 09/21/2020   Obesity (BMI 30.0-34.9) 02/10/2020   COPD (chronic obstructive pulmonary disease) ([place]) 10/26/2019   Thrombocytopenia ([place]) 09/14/2017   Medication noncompliance due to cognitive impairment 09/06/2017   Persistent atrial fibrillation ([place]) 08/04/2017   Paroxysmal atrial fibrillation ([place]) 02/08/2017   Cardiomyopathy, ischemic 02/08/2017   CKD (chronic kidney disease), stage III ([place]) 01/30/2017   Coronary artery disease due to lipid rich plaque    Essential hypertension     Current Outpatient Medications:    acetaminophen (TYLENOL) 325 MG tablet, Take 2 tablets (650 mg total) by mouth every 4 (four) hours as needed for headache or mild pain., Disp: , Rfl:    albuterol (VENTOLIN HFA) 108 (90 Base) MCG/ACT inhaler, Inhale 1-2 puffs into the lungs every 6 (six) hours as needed for wheezing or  shortness of breath., Disp: 18 g, Rfl: 2   allopurinol (ZYLOPRIM) 100 MG tablet, Take 100 mg by mouth daily as needed (gout symptoms)., Disp: , Rfl:    atorvastatin (LIPITOR) 10 MG tablet, Take 1 tablet (10 mg total) by mouth at bedtime., Disp: 90 tablet, Rfl: 1   carboxymethylcellulose (REFRESH PLUS) 0.5 % SOLN, Place 1 drop into both eyes daily as needed (dry eyes)., Disp: , Rfl:    gabapentin (NEURONTIN) 100 MG capsule, Take 1 capsule (100 mg total) by mouth at  bedtime., Disp: 90 capsule, Rfl: 0   hydrALAZINE (APRESOLINE) 25 MG tablet, Take 1 tablet (25 mg total) by mouth 3 (three) times daily., Disp: 270 tablet, Rfl: 1   isosorbide mononitrate (IMDUR) 30 MG 24 hr tablet, Take 1 tablet (30 mg total) by mouth daily. (Patient not taking: Reported on 09/23/2022), Disp: 30 tablet, Rfl: 0   losartan (COZAAR) 50 MG tablet, Take 1 tablet (50 mg total) by mouth daily., Disp: 30 tablet, Rfl: 6   metFORMIN (GLUCOPHAGE) 500 MG tablet, Take 1 tablet (500 mg total) by mouth daily with breakfast. Daily with a meal, Disp: 90 tablet, Rfl: 0   pantoprazole (PROTONIX) 40 MG tablet, Take 1 tablet (40 mg total) by mouth daily. Follow-up appt due in [place] must see provider for future refills, Disp: 90 tablet, Rfl: 0   Rivaroxaban (XARELTO) 15 MG TABS tablet, Take 1 tablet (15 mg total) by mouth daily with supper. Follow-up appt due in March, Disp: 90 tablet, Rfl: 0   spironolactone (ALDACTONE) 25 MG tablet, Take 1 tablet (25 mg total) by mouth daily., Disp: 30 tablet, Rfl: 11   sucralfate (CARAFATE) 1 GM/10ML suspension, Take 10 mLs (1 g total) by mouth 4 (four) times daily -  with meals and at bedtime., Disp: 420 mL, Rfl: 0   torsemide (DEMADEX) 20 MG tablet, Take 1 tablet (20 mg total) by mouth daily., Disp: 30 tablet, Rfl: 1   TRELEGY ELLIPTA 100-62.5-25 MCG/ACT AEPB, Inhale ONE PUFF into THE lungs daily (Patient not taking: Reported on 09/23/2022), Disp: 120 each, Rfl: 1 Allergies  Allergen Reactions   Bee Venom Anaphylaxis   Ivp Dye [Iodinated Contrast Media] Anaphylaxis   Ace Inhibitors     Other reaction(s): angioedema   Atenolol     Other reaction(s): severe headaches   Codeine Nausea And Vomiting   Entresto [Sacubitril-Valsartan] Other (See Comments)    Chest pain    Penicillin G     Other reaction(s): rash   Shrimp [Shellfish Allergy] Swelling   Simvastatin     Other reaction(s): not sure   Jardiance [Empagliflozin] Other (See Comments)    Caused Boils       Social History   Socioeconomic History   Marital status: Widowed    Spouse name: Not on file   Number of children: 1   Years of education: 38   Highest education level: Not on file  Occupational History   Occupation: RETIRED [employer]  Tobacco Use   Smoking status: Former    Packs/day: 0.50    Years: 56.00    Total pack years: 28.00    Types: Cigarettes    Quit date: 11/13/2021    Years since quitting: 0.9   Smokeless tobacco: Never   Tobacco comments:    smoking 2 cigarettes/day as of 10/23/20  Vaping Use   Vaping Use: Never used  Substance and Sexual Activity   Alcohol use: No    Alcohol/week: 0.0 standard drinks of alcohol  Drug use: No   Sexual activity: Not on file  Other Topics Concern   Not on file  Social History Narrative   Patient reports it being difficult to pay for everything due to outstanding medical bills from hospital stay last year but states she is able to keep up with basic expenses.  Patient does have some concerns with her house's condition due to a water leak- had her roof repaired last month but has some leaking in the front which has affected her porch.  Patient owns her own car and is able to drive herself but has some concerns about the reliability of her car- has gotten taxi to the clinic for appointment in the past when her car broke down.   Social Determinants of Health   Financial Resource Strain: Low Risk  (06/07/2018)   Overall Financial Resource Strain (CARDIA)    Difficulty of Paying Living Expenses: Not hard at all  Recent Concern: Financial Resource Strain - Medium Risk (05/21/2018)   Overall Financial Resource Strain (CARDIA)    Difficulty of Paying Living Expenses: Somewhat hard  Food Insecurity: No Food Insecurity (06/09/2022)   Hunger Vital Sign    Worried About Running Out of Food in the Last Year: Never true    [place] in the Last Year: Never true  Transportation Needs: No Transportation Needs (06/09/2022)    PRAPARE - Hydrologist (Medical): No    Lack of Transportation (Non-Medical): No  Physical Activity: Not on file  Stress: Not on file  Social Connections: Not on file  Intimate Partner Violence: Not At Risk (06/09/2022)   Humiliation, Afraid, Rape, and Kick questionnaire    Fear of Current or Ex-Partner: No    Emotionally Abused: No    Physically Abused: No    Sexually Abused: No    Physical Exam      Future Appointments  Date Time Provider [place]  10/28/2022  2:30 PM [facility] PA/NP [facility] None  11/08/2022  1:30 PM [facility]  12/07/2022  3:20 PM [facility] DEVICE 1 [facility]  01/17/2023  7:00 AM [facility] DEVICE REMOTES [facility]  03/07/2023  2:00 PM Evans Lance, MD [facility]  04/18/2023  7:05 AM [facility] DEVICE REMOTES [facility]  07/18/2023  7:00 AM [facility] DEVICE REMOTES [facility]  10/17/2023  7:00 AM [facility] DEVICE REMOTES [facility]

## 2022-10-27 ENCOUNTER — Telehealth (HOSPITAL_COMMUNITY): Payer: Self-pay

## 2022-10-27 NOTE — Progress Notes (Signed)
Advanced Heart Failure Clinic Note  PCP: Janith Lima, MD Primary Cardiologist: [name]  EP: Dr Lovena Le HF MD: Dr Haroldine Laws   HPI: [name] [name] is a 77 y.o. female with h/o HTN, former tobacco abuse, permanent AF, CAD s/p CABG x 2 (LIMA to LAD and SVG to OM) in 2016, Chronic combined HF, Ischemic CM, HLD, and Biotronik PPM.   She was initially noted to be in atrial fibrillation in 2016 with a reduced LVEF, leading to ischemic workup with a cardiac cath showing severe multivessel CAD with LM dz -> CABG x2 utilizing LIMA to LAD, SVG to OM.   Admitted 1/19 for AF with RVR in setting of Influenza A. EP was consulted to discuss plans for Tikosyn vs. ablation vs. medication rate control however, due to her known medication noncompliance she was found to not be an ablation nor antiarrhythmic candidate   2019 back in Afib. She was started on amiodarone & Entresto.  She underwent successful atrial flutter TEE/DCCV on 12/25/17.  Later went back in A fib.   Admitted 3/21 for iron-def anemia. Hgb 5.4 EGD/colonoscopy which were unremarkable and capsule endoscopy which showed duodenal ulcer with bleeding and stigmata.  GI recommended oral Protonix and outpatient follow-up.  Admitted 6/22 for AV node ablation and PPM due to uncontrolled heart rates with A fib. Underwent Biotronix single lead PPM. Toprol XL and digoxin stopped. Carvedilol 25 mg bid started. She was discharged 01/19/21.    Admitted 0000000 with a/c systolic heart failure. Diuresed with IV lasix and transitioned to torsemide 40 mg daily. EF 25-30% with severe MR/TR.    Admitted 10/22 for COPD exacerbation with a component of A/C CHF, ran out of torsemide several days prior  Echo 07/30/21: EF 25-30% Moderate RV dysfunction. Severe biatrial enlargement. Severe central MR/TR.   Was seen at [facility] in 4/23 for second opinion. Restarted Entresto 24/26 bid. Did not tolerate due to CP. Agreed she might  candidate for mTEER down the road.   She was seen in the ED  June, July, August, and twice in September 2023 for chest pain/shortness of breath.    Followed by EP for device upgrade. She was hesitant to pursue any procedures. Possible CRT discussed.   Admitted 10/23 with a/c HF, worrisome for low output. Echo showed EF 20%, severe LV dysfunction, RV moderately down.  Diuresed with IV lasix and started on milrinone. RHC showed low volume and CI 1.9 on milrinone, improved with small IVF bolus. Drips weaned and GDMT added back. BiDil increased with elevated BP. Referred back to Paramedicine to help with meds. Discharged home, weight 183 lbs.  Saw Dr. Lovena Le 11/23, planning upgrade to [place] ICD 11/2022.  Today she returns for HF follow up with [name] with paramedicine. Overall feeling fine. She has SOB walking on flat ground. Denies palpitations, CP, dizziness, edema, or PND/Orthopnea. Appetite ok. No fever or chills. Weight at home 175 pounds. Unclear if she is taking her meds regularly, she does not let [name] arrange her pillbox. Smokes 2 cigs/day, followed by paramedicine. She is nervous about her BiV upgrade.   Cardiac Studies    - RHC (10/23): on milrinone 0.25 RA = 1 RV = 27/1 PA = 32/13 (18) PCW = 2 Fick cardiac output/index = 5.3/2.7 Thermo = 3.8/1.9 PVR = 3.0 (  fick) 4.2 (TD) Ao sat = 93% PA sat = 71%, 68%   Given 175 cc NS load   PCW = 5 TD CO/CI = 4.1/2.1   1. Low volume status 2. With mild to moderately depressed CO on milrinone 3. Mild improvement in hemodynamics with volume loading    - Echo (10/23): EF 20%, severe LV dysfunction, RV moderately down  - Echo (6/22): EF 25-30% RV severely reduced  - Echo (4/22): EF 25-30% RV moderately reduced   - Echo (11/21): EF 30-35% RV normal   - Echo (4/21): EF 55-60%   - Stress Test (1/20): Low Risk EF 44%  - Echo (12/19): EF 30-35% RV mildly dilated.  - Echo (3/19): EF 30-35%, moderate LVH, Mild AI, Moderate regurgitation,  RV moderately down, PA peak pressure 50 mm Hg.   - Echo (5/18): EF stable 50-55%  - Echo (10/17): EF improved to 50-55%.  - Echo (3/16): EF 35-40%.  ROS: All systems negative except as listed in HPI, PMH and Problem List.  SH:  Social History   Socioeconomic History   Marital status: Widowed    Spouse name: Not on file   Number of children: 1   Years of education: 32   Highest education level: Not on file  Occupational History   Occupation: RETIRED [employer]  Tobacco Use   Smoking status: Former    Packs/day: 0.50    Years: 56.00    Additional pack years: 0.00    Total pack years: 28.00    Types: Cigarettes    Quit date: 11/13/2021    Years since quitting: 0.9   Smokeless tobacco: Never   Tobacco comments:    smoking 2 cigarettes/day as of 10/23/20  Vaping Use   Vaping Use: Never used  Substance and Sexual Activity   Alcohol use: No    Alcohol/week: 0.0 standard drinks of alcohol   Drug use: No   Sexual activity: Not on file  Other Topics Concern   Not on file  Social History Narrative   Patient reports it being difficult to pay for everything due to outstanding medical bills from hospital stay last year but states she is able to keep up with basic expenses.  Patient does have some concerns with her house's condition due to a water leak- had her roof repaired last month but has some leaking in the front which has affected her porch.  Patient owns her own car and is able to drive herself but has some concerns about the reliability of her car- has gotten taxi to the clinic for appointment in the past when her car broke down.   Social Determinants of Health   Financial Resource Strain: Low Risk  (06/07/2018)   Overall Financial Resource Strain (CARDIA)    Difficulty of Paying Living Expenses: Not hard at all  Recent Concern: Financial Resource Strain - Medium Risk (05/21/2018)   Overall Financial Resource Strain (CARDIA)    Difficulty of Paying Living Expenses:  Somewhat hard  Food Insecurity: No Food Insecurity (06/09/2022)   Hunger Vital Sign    Worried About Running Out of Food in the Last Year: Never true    [place] in the Last Year: Never true  Transportation Needs: No Transportation Needs (06/09/2022)   PRAPARE - Hydrologist (Medical): No    Lack of Transportation (Non-Medical): No  Physical Activity: Not on file  Stress: Not on file  Social Connections: Not on file  Intimate Partner Violence: Not At Risk (06/09/2022)   Humiliation, Afraid, Rape, and Kick questionnaire    Fear of Current or Ex-Partner: No    Emotionally Abused: No    Physically Abused: No    Sexually Abused: No   FH:  Family History  Problem Relation Age of Onset   Cancer Mother        LYMPHOMA   Heart disease Father    Other Father        TB   CVA Sister    Prostate cancer Brother 70   Diabetes Brother    Past Medical History:  Diagnosis Date   ([place]) heart failure with improved ejection fraction ([place])    a. 07/2018 Echo: EF 30-35%; b. 11/2019 Echo: EF 55-60%, no rwma, Gr2 DD, Nl RV size/fxn. Mild BAE. Mild MR/AI.   Arthritis    Asthma    Atopic dermatitis    CAD (coronary artery disease)    a. 2016 s/p CABG x 2 (LIMA->LAD, VG->OM); b. 08/2018 MV: EF 44%, no ischemia/infact.   Cardiac arrest ([place]) 10/2014   Cardiomyopathy, ischemic    a. 07/2018 Echo: EF 30-35%; 11/2019 Echo: EF 55-60%.   Carotid arterial disease ([place])    a. 11/2019 Carotid U/S   CHF (congestive heart failure) ([facility])    CKD (chronic kidney disease), stage III ([facility])    COPD (chronic obstructive pulmonary disease) ([facility])    Diabetes mellitus without complication ([place])    Dysrhythmia    Esophageal dilatation 2013   GERD (gastroesophageal reflux disease)    Glaucoma    Gout    Headache    Hypertension    Hypokalemia    Idiopathic angioedema    LGI bleed 08/06/2017   a. felt to be hemorrhoidal during that admission (no drop in Hgb).   Lower back  pain    Paroxysmal atrial fibrillation ([facility])    a. Dx 2016-->h/o difficult to control rates (complicated by noncompliance), not felt to be a candidate for ablation or antiarrhythmic due to noncompliance; b. Recurrent AF 2021 - converted w/ IV dilt; c. CHA2DS2VASc = 7-->Xarelto.   Personal history of noncompliance with medical treatment, presenting hazards to health    Prediabetes    Presence of permanent cardiac pacemaker    S/P CABG x 2 with clipping of LA appendage 11/14/2014   LIMA to LAD, SVG to OM, EVH via right thigh   Urine incontinence    Uterine fibroid    Current Outpatient Medications  Medication Sig Dispense Refill   acetaminophen (TYLENOL) 325 MG tablet Take 2 tablets (650 mg total) by mouth every 4 (four) hours as needed for headache or mild pain.     albuterol (VENTOLIN HFA) 108 (90 Base) MCG/ACT inhaler Inhale 1-2 puffs into the lungs every 6 (six) hours as needed for wheezing or shortness of breath. 18 g 2   allopurinol (ZYLOPRIM) 100 MG tablet Take 100 mg by mouth daily as needed (gout symptoms).     atorvastatin (LIPITOR) 10 MG tablet Take 1 tablet (10 mg total) by mouth at bedtime. 90 tablet 1   carboxymethylcellulose (REFRESH PLUS) 0.5 % SOLN Place 1 drop into both eyes daily as needed (dry eyes).     gabapentin (NEURONTIN) 100 MG capsule Take 1 capsule (100 mg total) by mouth at bedtime. 90 capsule 0   hydrALAZINE (APRESOLINE) 25 MG tablet Take 1 tablet (25 mg total) by mouth 3 (three) times daily. 270 tablet 1   isosorbide mononitrate (IMDUR) 30 MG 24  hr tablet Take 1 tablet (30 mg total) by mouth daily. 30 tablet 0   losartan (COZAAR) 50 MG tablet Take 1 tablet (50 mg total) by mouth daily. 30 tablet 6   metFORMIN (GLUCOPHAGE) 500 MG tablet Take 1 tablet (500 mg total) by mouth daily with breakfast. Daily with a meal 90 tablet 0   pantoprazole (PROTONIX) 40 MG tablet Take 1 tablet (40 mg total) by mouth daily. Follow-up appt due in [place] must see provider for future  refills 90 tablet 0   Rivaroxaban (XARELTO) 15 MG TABS tablet Take 1 tablet (15 mg total) by mouth daily with supper. Follow-up appt due in March 90 tablet 0   spironolactone (ALDACTONE) 25 MG tablet Take 1 tablet (25 mg total) by mouth daily. 30 tablet 11   sucralfate (CARAFATE) 1 GM/10ML suspension Take 10 mLs (1 g total) by mouth 4 (four) times daily -  with meals and at bedtime. (Patient taking differently: Take 1 g by mouth 4 (four) times daily -  with meals and at bedtime. Pt is taking prn) 420 mL 0   torsemide (DEMADEX) 20 MG tablet Take 1 tablet (20 mg total) by mouth daily. 30 tablet 1   TRELEGY ELLIPTA 100-62.5-25 MCG/ACT AEPB Inhale ONE PUFF into THE lungs daily 120 each 1   No current facility-administered medications for this encounter.   BP (!) 146/86   Pulse 75   Ht 5\' 7"  (1.702 m)   Wt 83.9 kg (185 lb)   SpO2 96%   BMI 28.98 kg/m   Wt Readings from Last 3 Encounters:  10/28/22 83.9 kg (185 lb)  10/21/22 79.7 kg (175 lb 9.6 oz)  09/23/22 84.6 kg (186 lb 6.4 oz)   PHYSICAL EXAM: General:  NAD. No resp difficulty, walked into clinic HEENT: Normal Neck: Supple. No JVD. Carotids 2+ bilat; no bruits. No lymphadenopathy or thryomegaly appreciated. Cor: PMI nondisplaced. Irregular rate & rhythm. No rubs, gallops or murmurs. Lungs: Clear Abdomen: Soft, nontender, nondistended. No hepatosplenomegaly. No bruits or masses. Good bowel sounds. Extremities: No cyanosis, clubbing, rash, edema Neuro: Alert & oriented x 3, cranial nerves grossly intact. Moves all 4 extremities w/o difficulty. Affect pleasant.  ASSESSMENT & PLAN: 1. Chronic Systolic Heart Failure - Suspect Combined ICM /NICM with previous A fib RVR. RV Pacing? - In 2021 EF improved 55-60% but over the last year EF has been down 25-30 % with severe MR/TR - Echo (10/23): EF 20%, moderate LVH, RV moderately reduced, RVSP 46 mmHg, moderate BAE, mild to moderate MR, moderate TR, - Planning on BiV upgrade next month. -  Stable NYHA II, volume OK today. - Continue torsemide 20 mg daily. - Continue spironolactone 25 mg daily. - Continue losartan 50 mg daily (failed Entresto with chest pain). - Continue hydralazine 25 mg tid + Imdur 30 mg daily. - No SGLT2i due UTI/yeast infections.  - Labs today. - Repeat echo a few months after BiV upgrade to look for EF improvement.  2. MR, Previously Severe - Mild to moderate on echo - Follow on echo   3. Permanent A fib  - Biotronik dual chamber PPM/AV nodal ablation 2022.  - EP planning upgrade to CRT d/t high burden of RV pacing next month. - Rate controlled.  - Continue Xarelto 20 mg daily. No bleeding issues.   4. CAD - s/p CABG x2 2016 (LIMA to LAD and SVG to OM)  - No chest pain.  - Continue Imdur. - Continue statin.   5. COPD -  Discussed smoking cessation. - Smokes 2 cigs/day.   6. PVCs - During admit, on milrinone. - asymptomatic. - Labs today.  7. CKD III - Baseline SCr 1.2-1.4 - Labs today.  8. Hypertension - BP elevated today but 120-140 on home check. - GDMT as above.   Continue HF Paramedicine for assistance with medications. She is at high risk for readmission. She is agreeable to have [name] fill her pillbox.  Follow up in 3-4 months with [name] + echo.  Allena Katz, FNP-BC 10/28/22

## 2022-10-27 NOTE — Telephone Encounter (Signed)
Called both numbers in patient's chart and was unable to leave a voicemail to confirm/remind patient of their appointment at the [facility] on 10/28/22.

## 2022-10-28 ENCOUNTER — Encounter (HOSPITAL_COMMUNITY): Payer: Self-pay

## 2022-10-28 ENCOUNTER — Other Ambulatory Visit (HOSPITAL_COMMUNITY): Payer: Self-pay | Admitting: Emergency Medicine

## 2022-10-28 ENCOUNTER — Ambulatory Visit (HOSPITAL_COMMUNITY)
Admission: RE | Admit: 2022-10-28 | Discharge: 2022-10-28 | Disposition: A | Discharge status: Home / Self Care | Payer: Medicare Other | Source: Ambulatory Visit | Attending: Family Medicine | Admitting: Family Medicine

## 2022-10-28 VITALS — BP 146/86 | HR 75 | Ht 67.0 in | Wt 185.0 lb

## 2022-10-28 DIAGNOSIS — E785 Hyperlipidemia, unspecified: Secondary | ICD-10-CM | POA: Diagnosis not present

## 2022-10-28 DIAGNOSIS — I4819 Other persistent atrial fibrillation: Secondary | ICD-10-CM

## 2022-10-28 DIAGNOSIS — I5022 Chronic systolic (congestive) heart failure: Secondary | ICD-10-CM | POA: Diagnosis not present

## 2022-10-28 DIAGNOSIS — I130 Hypertensive heart and chronic kidney disease with heart failure and stage 1 through stage 4 chronic kidney disease, or unspecified chronic kidney disease: Secondary | ICD-10-CM | POA: Diagnosis not present

## 2022-10-28 DIAGNOSIS — R079 Chest pain, unspecified: Secondary | ICD-10-CM | POA: Diagnosis not present

## 2022-10-28 DIAGNOSIS — I493 Ventricular premature depolarization: Secondary | ICD-10-CM | POA: Insufficient documentation

## 2022-10-28 DIAGNOSIS — I2510 Atherosclerotic heart disease of native coronary artery without angina pectoris: Secondary | ICD-10-CM | POA: Diagnosis not present

## 2022-10-28 DIAGNOSIS — I340 Nonrheumatic mitral (valve) insufficiency: Secondary | ICD-10-CM | POA: Diagnosis not present

## 2022-10-28 DIAGNOSIS — K219 Gastro-esophageal reflux disease without esophagitis: Secondary | ICD-10-CM | POA: Diagnosis not present

## 2022-10-28 DIAGNOSIS — Z8719 Personal history of other diseases of the digestive system: Secondary | ICD-10-CM | POA: Diagnosis not present

## 2022-10-28 DIAGNOSIS — Z7984 Long term (current) use of oral hypoglycemic drugs: Secondary | ICD-10-CM | POA: Diagnosis not present

## 2022-10-28 DIAGNOSIS — Z951 Presence of aortocoronary bypass graft: Secondary | ICD-10-CM | POA: Diagnosis not present

## 2022-10-28 DIAGNOSIS — I10 Essential (primary) hypertension: Secondary | ICD-10-CM | POA: Diagnosis not present

## 2022-10-28 DIAGNOSIS — E1122 Type 2 diabetes mellitus with diabetic chronic kidney disease: Secondary | ICD-10-CM | POA: Insufficient documentation

## 2022-10-28 DIAGNOSIS — J4489 Other specified chronic obstructive pulmonary disease: Secondary | ICD-10-CM | POA: Diagnosis not present

## 2022-10-28 DIAGNOSIS — Z950 Presence of cardiac pacemaker: Secondary | ICD-10-CM | POA: Diagnosis not present

## 2022-10-28 DIAGNOSIS — N1832 Chronic kidney disease, stage 3b: Secondary | ICD-10-CM

## 2022-10-28 DIAGNOSIS — Z7901 Long term (current) use of anticoagulants: Secondary | ICD-10-CM | POA: Diagnosis not present

## 2022-10-28 DIAGNOSIS — M109 Gout, unspecified: Secondary | ICD-10-CM | POA: Insufficient documentation

## 2022-10-28 DIAGNOSIS — I5042 Chronic combined systolic (congestive) and diastolic (congestive) heart failure: Secondary | ICD-10-CM | POA: Insufficient documentation

## 2022-10-28 DIAGNOSIS — F17210 Nicotine dependence, cigarettes, uncomplicated: Secondary | ICD-10-CM | POA: Diagnosis not present

## 2022-10-28 DIAGNOSIS — N183 Chronic kidney disease, stage 3 unspecified: Secondary | ICD-10-CM | POA: Diagnosis not present

## 2022-10-28 DIAGNOSIS — I4821 Permanent atrial fibrillation: Secondary | ICD-10-CM | POA: Diagnosis not present

## 2022-10-28 DIAGNOSIS — Z720 Tobacco use: Secondary | ICD-10-CM | POA: Diagnosis not present

## 2022-10-28 DIAGNOSIS — Z79899 Other long term (current) drug therapy: Secondary | ICD-10-CM | POA: Insufficient documentation

## 2022-10-28 DIAGNOSIS — Z91148 Patient's other noncompliance with medication regimen for other reason: Secondary | ICD-10-CM | POA: Insufficient documentation

## 2022-10-28 LAB — BASIC METABOLIC PANEL
Anion gap: 12 (ref 5–15)
BUN: 23 mg/dL (ref 8–23)
CO2: 17 mmol/L — ABNORMAL LOW (ref 22–32)
Calcium: 9.2 mg/dL (ref 8.9–10.3)
Chloride: 112 mmol/L — ABNORMAL HIGH (ref 98–111)
Creatinine, Ser: 1.18 mg/dL — ABNORMAL HIGH (ref 0.44–1.00)
GFR, Estimated: 48 mL/min — ABNORMAL LOW (ref >60)
Glucose, Bld: 89 mg/dL (ref 70–99)
Potassium: 3.6 mmol/L (ref 3.5–5.1)
Sodium: 141 mmol/L (ref 135–145)

## 2022-10-28 LAB — BRAIN NATRIURETIC PEPTIDE: B Natriuretic Peptide: 559.3 pg/mL — ABNORMAL HIGH (ref 0.0–100.0)

## 2022-10-28 NOTE — Patient Instructions (Signed)
No change in medications. Labs drawn today - will call you if abnormal. Return to see [name] in 3 months with echo. Please call our clinic at [phone] in May to schedule this appointment. Call Heart Failure Clinic at [phone] if any questions or concerns prior to next appointment.

## 2022-10-30 NOTE — Progress Notes (Signed)
Paramedicine Encounter   Patient ID: [name] [name] , female,   DOB: 1946-05-25,76 y.o.,  MRN: [id number]   Met patient in clinic today with provider.  Time spent with patient 30 minutes  [facility] discussed med  compliance with [name] [name] and asked her if she would allow me to put her meds in a pill box and she agreed to do same.  It has been hard to be able to measure med compliance when she has been refusing to allow me to reconcile a pill box for her.  I will see her Tuesday and will bring her a pill box at that time.  [name], [place] 10/30/2022

## 2022-11-01 ENCOUNTER — Telehealth (HOSPITAL_COMMUNITY): Payer: Self-pay | Admitting: Cardiology

## 2022-11-01 ENCOUNTER — Other Ambulatory Visit (HOSPITAL_COMMUNITY): Payer: Self-pay | Admitting: Emergency Medicine

## 2022-11-01 MED ORDER — ISOSORBIDE MONONITRATE ER 30 MG PO TB24: 30.0000 mg | ORAL_TABLET | Freq: Every day | ORAL | 11 refills | Status: DC

## 2022-11-01 NOTE — Telephone Encounter (Signed)
-----   Message from [name], Paramedic sent at 11/01/2022  3:11 PM EDT ----- Regarding: Refill Ms. Sudar needs a refill on Isosorbide 30mg  - 1 tablet daily.  She uses [facility].  Beckie Busing, [place] 11/01/2022

## 2022-11-01 NOTE — Progress Notes (Signed)
Paramedicine Encounter    Patient ID: [name] [name], female    DOB: 06-21-1946, 77 y.o.   MRN: [id number]   Complaints No complaints  Assessment: Pt. Denies chest pain or SOB.  Lung sounds clear and equal bilat.  No edema noted.  Compliance with meds Not taking Isosorbide  Pill box filled x 1 week  Refills needed Isosorbide  Meds changes since last visit none    Social changes NONE   There were no vitals taken for this visit. Weight yesterday-not taken Last visit weight-175lb (home visit 10/21/22)  At pts HF Clinic visit on 3/15 w/ [facility] pt agreed to allow me to do a pill box for her.  Till this point she had refused repeatedly   As I reviewed her meds today and reconciled her pill box x 1 week it appears she has not been taking Isosorbide.  Message sent to [facility] Clinic for refill on same from [place].  Will make sure this gets added to her med regimen ASAP.  ACTION: Home visit completed  Skipper Cliche [id number] 11/01/22  Patient Care Team: Janith Lima, MD as PCP - General (Internal Medicine) Jerline Pain, MD as PCP - Cardiology (Cardiology) Evans Lance, MD as PCP - Electrophysiology (Cardiology) [name], MD as Consulting Physician (Cardiology)  Patient Active Problem List   Diagnosis Date Noted   Ceruminosis, bilateral 06/17/2022   Flu vaccine need 06/17/2022   Chronic combined systolic and diastolic CHF (congestive heart failure) ([place]) 06/17/2022   Encounter for general adult medical examination with abnormal findings 06/16/2022   Chronic anticoagulation 06/06/2022   Severe mitral regurgitation 06/06/2022   Controlled type 2 diabetes mellitus without complication, without long-term current use of insulin ([place]) 06/06/2022   Pacemaker 02/22/2022   Obstructive sleep apnea 09/21/2020   Obesity (BMI 30.0-34.9) 02/10/2020   COPD (chronic obstructive pulmonary disease) ([place]) 10/26/2019   Thrombocytopenia ([place]) 09/14/2017    Medication noncompliance due to cognitive impairment 09/06/2017   Persistent atrial fibrillation ([place]) 08/04/2017   Paroxysmal atrial fibrillation ([place]) 02/08/2017   Cardiomyopathy, ischemic 02/08/2017   CKD (chronic kidney disease), stage III ([place]) 01/30/2017   Coronary artery disease due to lipid rich plaque    Essential hypertension     Current Outpatient Medications:    acetaminophen (TYLENOL) 325 MG tablet, Take 2 tablets (650 mg total) by mouth every 4 (four) hours as needed for headache or mild pain., Disp: , Rfl:    albuterol (VENTOLIN HFA) 108 (90 Base) MCG/ACT inhaler, Inhale 1-2 puffs into the lungs every 6 (six) hours as needed for wheezing or shortness of breath., Disp: 18 g, Rfl: 2   allopurinol (ZYLOPRIM) 100 MG tablet, Take 100 mg by mouth daily as needed (gout symptoms)., Disp: , Rfl:    atorvastatin (LIPITOR) 10 MG tablet, Take 1 tablet (10 mg total) by mouth at bedtime., Disp: 90 tablet, Rfl: 1   carboxymethylcellulose (REFRESH PLUS) 0.5 % SOLN, Place 1 drop into both eyes daily as needed (dry eyes)., Disp: , Rfl:    gabapentin (NEURONTIN) 100 MG capsule, Take 1 capsule (100 mg total) by mouth at bedtime., Disp: 90 capsule, Rfl: 0   hydrALAZINE (APRESOLINE) 25 MG tablet, Take 1 tablet (25 mg total) by mouth 3 (three) times daily., Disp: 270 tablet, Rfl: 1   isosorbide mononitrate (IMDUR) 30 MG 24 hr tablet, Take 1 tablet (30 mg total) by mouth daily., Disp: 30 tablet, Rfl: 11   losartan (COZAAR) 50 MG tablet, Take  1 tablet (50 mg total) by mouth daily., Disp: 30 tablet, Rfl: 6   metFORMIN (GLUCOPHAGE) 500 MG tablet, Take 1 tablet (500 mg total) by mouth daily with breakfast. Daily with a meal, Disp: 90 tablet, Rfl: 0   pantoprazole (PROTONIX) 40 MG tablet, Take 1 tablet (40 mg total) by mouth daily. Follow-up appt due in [place] must see provider for future refills, Disp: 90 tablet, Rfl: 0   Rivaroxaban (XARELTO) 15 MG TABS tablet, Take 1 tablet (15 mg total) by mouth daily  with supper. Follow-up appt due in March, Disp: 90 tablet, Rfl: 0   spironolactone (ALDACTONE) 25 MG tablet, Take 1 tablet (25 mg total) by mouth daily., Disp: 30 tablet, Rfl: 11   sucralfate (CARAFATE) 1 GM/10ML suspension, Take 10 mLs (1 g total) by mouth 4 (four) times daily -  with meals and at bedtime. (Patient taking differently: Take 1 g by mouth 4 (four) times daily -  with meals and at bedtime. Pt is taking prn), Disp: 420 mL, Rfl: 0   torsemide (DEMADEX) 20 MG tablet, Take 1 tablet (20 mg total) by mouth daily., Disp: 30 tablet, Rfl: 1   TRELEGY ELLIPTA 100-62.5-25 MCG/ACT AEPB, Inhale ONE PUFF into THE lungs daily, Disp: 120 each, Rfl: 1 Allergies  Allergen Reactions   Bee Venom Anaphylaxis   Ivp Dye [Iodinated Contrast Media] Anaphylaxis   Ace Inhibitors     Other reaction(s): angioedema   Atenolol     Other reaction(s): severe headaches   Codeine Nausea And Vomiting   Entresto [Sacubitril-Valsartan] Other (See Comments)    Chest pain    Penicillin G     Other reaction(s): rash   Shrimp [Shellfish Allergy] Swelling   Simvastatin     Other reaction(s): not sure   Jardiance [Empagliflozin] Other (See Comments)    Caused Boils      Social History   Socioeconomic History   Marital status: Widowed    Spouse name: Not on file   Number of children: 1   Years of education: 40   Highest education level: Not on file  Occupational History   Occupation: RETIRED [employer]  Tobacco Use   Smoking status: Former    Packs/day: 0.50    Years: 56.00    Additional pack years: 0.00    Total pack years: 28.00    Types: Cigarettes    Quit date: 11/13/2021    Years since quitting: 0.9   Smokeless tobacco: Never   Tobacco comments:    smoking 2 cigarettes/day as of 10/23/20  Vaping Use   Vaping Use: Never used  Substance and Sexual Activity   Alcohol use: No    Alcohol/week: 0.0 standard drinks of alcohol   Drug use: No   Sexual activity: Not on file  Other Topics  Concern   Not on file  Social History Narrative   Patient reports it being difficult to pay for everything due to outstanding medical bills from hospital stay last year but states she is able to keep up with basic expenses.  Patient does have some concerns with her house's condition due to a water leak- had her roof repaired last month but has some leaking in the front which has affected her porch.  Patient owns her own car and is able to drive herself but has some concerns about the reliability of her car- has gotten taxi to the clinic for appointment in the past when her car broke down.   Social Determinants of Health  Financial Resource Strain: Low Risk  (06/07/2018)   Overall Financial Resource Strain (CARDIA)    Difficulty of Paying Living Expenses: Not hard at all  Recent Concern: Financial Resource Strain - Medium Risk (05/21/2018)   Overall Financial Resource Strain (CARDIA)    Difficulty of Paying Living Expenses: Somewhat hard  Food Insecurity: No Food Insecurity (06/09/2022)   Hunger Vital Sign    Worried About Running Out of Food in the Last Year: Never true    [place] in the Last Year: Never true  Transportation Needs: No Transportation Needs (06/09/2022)   PRAPARE - Hydrologist (Medical): No    Lack of Transportation (Non-Medical): No  Physical Activity: Not on file  Stress: Not on file  Social Connections: Not on file  Intimate Partner Violence: Not At Risk (06/09/2022)   Humiliation, Afraid, Rape, and Kick questionnaire    Fear of Current or Ex-Partner: No    Emotionally Abused: No    Physically Abused: No    Sexually Abused: No    Physical Exam      Future Appointments  Date Time Provider [place]  11/08/2022  1:30 PM [facility]  12/07/2022  3:20 PM [facility]  01/17/2023  7:00 AM [facility]OTES [facility]  03/07/2023  2:00 PM  Evans Lance, MD [facility]  04/18/2023  7:05 AM [facility]  07/18/2023  7:00 AM [facility]  10/17/2023  7:00 AM [facility]

## 2022-11-01 NOTE — Telephone Encounter (Signed)
Refills returned to pharmacy  

## 2022-11-03 ENCOUNTER — Telehealth (HOSPITAL_COMMUNITY): Payer: Self-pay | Admitting: Emergency Medicine

## 2022-11-03 ENCOUNTER — Other Ambulatory Visit (HOSPITAL_COMMUNITY): Payer: Self-pay | Admitting: Emergency Medicine

## 2022-11-03 NOTE — Telephone Encounter (Signed)
I reached out to [facility] to check on status of Isosorbide.  It will be ready after 3:00 today.  The person I spoke with advised that they have been trying to reach Ms. Geiman by phone unsuccessfully.  [facility] states that they were unable to deliver her meds last month.  I reviewed her list of meds and I will personally pick them up and take them to her this afternoon.  I will add the Isosorbide to her regimen at that time.    [name], [place] 11/03/2022

## 2022-11-04 NOTE — Progress Notes (Signed)
This was a quick visit to add meds to her med regimen. She received a delivery from [facility] today for Isosorbide 30mg , Albuterol rescue inhaler and Trelegy.  She advises she is not agreeable to use the Trelegy inhaler as it "chokes me up."   She also states "I'll take my fluid pill when I'm ready cause I've got things to do and can't always be near the bathroom."  She also was refusing to take her Spironolactone.  I advised her that it was important to be compliant with all of her meds for maximum benefits to her heart failure.  Today she states she does not like using a pill box.  She wants to take them from the bottles. She says she wants to know what she's  taking and can't do that when they are in the box.  For now I have been able to agree to stay in the pill box.   I reviewed individual pills from the pill box and discussed what each was for.  I will follow up with her next week for a complete home visit.    [name], [place] 11/04/2022

## 2022-11-06 ENCOUNTER — Other Ambulatory Visit: Payer: Self-pay | Admitting: Internal Medicine

## 2022-11-08 ENCOUNTER — Ambulatory Visit: Payer: Medicare Other | Attending: Family Medicine

## 2022-11-08 DIAGNOSIS — I5042 Chronic combined systolic (congestive) and diastolic (congestive) heart failure: Secondary | ICD-10-CM | POA: Diagnosis not present

## 2022-11-09 ENCOUNTER — Ambulatory Visit (HOSPITAL_COMMUNITY)
Admission: RE | Admit: 2022-11-09 | Discharge: 2022-11-09 | Disposition: A | Discharge status: Home / Self Care | Payer: Medicare Other | Source: Ambulatory Visit | Attending: Podiatry | Admitting: Podiatry

## 2022-11-09 DIAGNOSIS — I739 Peripheral vascular disease, unspecified: Secondary | ICD-10-CM | POA: Insufficient documentation

## 2022-11-09 LAB — BASIC METABOLIC PANEL
BUN/Creatinine Ratio: 19 (ref 12–28)
BUN: 28 mg/dL — ABNORMAL HIGH (ref 8–27)
CO2: 20 mmol/L (ref 20–29)
Calcium: 9.9 mg/dL (ref 8.7–10.3)
Chloride: 107 mmol/L — ABNORMAL HIGH (ref 96–106)
Creatinine, Ser: 1.44 mg/dL — ABNORMAL HIGH (ref 0.57–1.00)
Glucose: 86 mg/dL (ref 70–99)
Potassium: 4.5 mmol/L (ref 3.5–5.2)
Sodium: 144 mmol/L (ref 134–144)
eGFR: 37 mL/min/{1.73_m2} — ABNORMAL LOW (ref >59)

## 2022-11-09 LAB — CBC
Hematocrit: 47.4 % — ABNORMAL HIGH (ref 34.0–46.6)
Hemoglobin: 15.7 g/dL (ref 11.1–15.9)
MCH: 29.9 pg (ref 26.6–33.0)
MCHC: 33.1 g/dL (ref 31.5–35.7)
MCV: 90 fL (ref 79–97)
Platelets: 122 10*3/uL — ABNORMAL LOW (ref 150–450)
RBC: 5.25 x10E6/uL (ref 3.77–5.28)
RDW: 14.9 % (ref 11.7–15.4)
WBC: 6.1 10*3/uL (ref 3.4–10.8)

## 2022-11-10 LAB — VAS US ABI WITH/WO TBI
Left ABI: 0.51
Right ABI: 0.56

## 2022-11-17 ENCOUNTER — Other Ambulatory Visit (HOSPITAL_COMMUNITY): Payer: Self-pay | Admitting: Emergency Medicine

## 2022-11-17 NOTE — Progress Notes (Signed)
Paramedicine Encounter    Patient ID: [name] [name], female    DOB: 1946/06/27, 77 y.o.   MRN: [id number]   Complaints NONE  AssessmentA&O x 4, skin W&D w/ good color. Lung sounds clear and equal bilat  Compliance with meds YES - per patient  Pill box filled now refuses to use pill box  Refills needed handled by pt  Meds changes since last visit pt refuses to take Isosorbide    Social changes NONE Cbg 125  BP 130/78 (BP Location: Left Arm, Patient Position: Sitting, Cuff Size: Normal)   Pulse 70   Resp 14   Wt 182 lb 9.6 oz (82.8 kg)   SpO2 97%   BMI 28.60 kg/m  Weight yesterday-not taken Last visit weight-175lb  We're back to [name] [name] refusing to use a pill box. I reviewed each individual med on the list and she advises she is taking everything except she refuses to take Isosorbide. She says it gives her a terrible headache.  She tells me that she thinks that the different doctors are "working against each other"  She says, "one doctor will do one thing and then another doctor will tell her that's not the way they would do it."  She is unhappy that she doesn't get to see Dr. Shirlee LatchMcLean specifically at each visit.  I did try to explain to her that all the providers in the clinic are on the same page and work together to provide the best patient care.   I'm not really sure  that I'm truly providing a beneficial service to her through paramedicine other than providing morale support and encouragement. She is very apprehensive about her BIVI upgrade and I have reassured her that this is a common procedure and that he benefits of having it outweigh the risks of not.   ACTION: Home visit completed  Bethanie DickerDede Mechel Schutter, EMT-Paramedic [phone] 11/17/22  Patient Care Team: Etta GrandchildJones, Thomas L, MD as PCP - General (Internal Medicine) Jake BatheSkains, Mark C, MD as PCP - Cardiology (Cardiology) Marinus Mawaylor, Gregg W, MD as PCP - Electrophysiology (Cardiology) Chrystie NoseHilty, Kenneth C, MD as Consulting Physician  (Cardiology)  Patient Active Problem List   Diagnosis Date Noted   Ceruminosis, bilateral 06/17/2022   Flu vaccine need 06/17/2022   Chronic combined systolic and diastolic CHF (congestive heart failure) 06/17/2022   Encounter for general adult medical examination with abnormal findings 06/16/2022   Chronic anticoagulation 06/06/2022   Severe mitral regurgitation 06/06/2022   Controlled type 2 diabetes mellitus without complication, without long-term current use of insulin 06/06/2022   Pacemaker 02/22/2022   Obstructive sleep apnea 09/21/2020   Obesity (BMI 30.0-34.9) 02/10/2020   COPD (chronic obstructive pulmonary disease) 10/26/2019   Thrombocytopenia 09/14/2017   Medication noncompliance due to cognitive impairment 09/06/2017   Persistent atrial fibrillation 08/04/2017   Paroxysmal atrial fibrillation 02/08/2017   Cardiomyopathy, ischemic 02/08/2017   CKD (chronic kidney disease), stage III 01/30/2017   Coronary artery disease due to lipid rich plaque    Essential hypertension     Current Outpatient Medications:    acetaminophen (TYLENOL) 500 MG tablet, Take 500 mg by mouth every 6 (six) hours as needed for moderate pain., Disp: , Rfl:    albuterol (VENTOLIN HFA) 108 (90 Base) MCG/ACT inhaler, Inhale 1-2 puffs into the lungs every 6 (six) hours as needed for wheezing or shortness of breath., Disp: 18 g, Rfl: 2   allopurinol (ZYLOPRIM) 100 MG tablet, Take 100 mg by mouth daily., Disp: , Rfl:  atorvastatin (LIPITOR) 10 MG tablet, Take 1 tablet (10 mg total) by mouth at bedtime., Disp: 90 tablet, Rfl: 1   hydrALAZINE (APRESOLINE) 25 MG tablet, Take 1 tablet (25 mg total) by mouth 3 (three) times daily., Disp: 270 tablet, Rfl: 1   losartan (COZAAR) 50 MG tablet, Take 1 tablet (50 mg total) by mouth daily., Disp: 30 tablet, Rfl: 6   metFORMIN (GLUCOPHAGE) 500 MG tablet, Take 1 tablet (500 mg total) by mouth daily with breakfast. Daily with a meal, Disp: 90 tablet, Rfl: 0    pantoprazole (PROTONIX) 40 MG tablet, Take 1 tablet (40 mg total) by mouth daily. Follow-up appt due in Marcht must see provider for future refills, Disp: 90 tablet, Rfl: 0   Rivaroxaban (XARELTO) 15 MG TABS tablet, Take 1 tablet (15 mg total) by mouth daily with supper. Follow-up appt due in March, Disp: 90 tablet, Rfl: 0   spironolactone (ALDACTONE) 25 MG tablet, Take 1 tablet (25 mg total) by mouth daily., Disp: 30 tablet, Rfl: 11   sucralfate (CARAFATE) 1 GM/10ML suspension, Take 10 mLs (1 g total) by mouth 4 (four) times daily -  with meals and at bedtime. (Patient taking differently: Take 1 g by mouth daily as needed (indigestion).), Disp: 420 mL, Rfl: 0   torsemide (DEMADEX) 20 MG tablet, TAKE 1 TABLET BY MOUTH EVERY DAY, Disp: 90 tablet, Rfl: 0   TRELEGY ELLIPTA 100-62.5-25 MCG/ACT AEPB, Inhale ONE PUFF into THE lungs daily (Patient taking differently: Inhale 1 puff into the lungs daily as needed (shortness of breath).), Disp: 120 each, Rfl: 1   acetaminophen (TYLENOL) 325 MG tablet, Take 2 tablets (650 mg total) by mouth every 4 (four) hours as needed for headache or mild pain. (Patient not taking: Reported on 11/17/2022), Disp: , Rfl:    gabapentin (NEURONTIN) 100 MG capsule, Take 1 capsule (100 mg total) by mouth at bedtime. (Patient not taking: Reported on 11/17/2022), Disp: 90 capsule, Rfl: 0   isosorbide mononitrate (IMDUR) 30 MG 24 hr tablet, Take 1 tablet (30 mg total) by mouth daily. (Patient not taking: Reported on 11/01/2022), Disp: 30 tablet, Rfl: 11   Naphazoline HCl (CLEAR EYES OP), Place 1 drop into both eyes daily as needed (irritation). (Patient not taking: Reported on 11/17/2022), Disp: , Rfl:  Allergies  Allergen Reactions   Bee Venom Anaphylaxis   Ivp Dye [Iodinated Contrast Media] Anaphylaxis   Ace Inhibitors     angioedema   Atenolol     severe headaches   Codeine Nausea And Vomiting   Entresto [Sacubitril-Valsartan] Other (See Comments)    Chest pain    Isosorbide      Severe headaches   Shrimp [Shellfish Allergy] Swelling   Simvastatin     not sure   Jardiance [Empagliflozin] Other (See Comments)    Caused Boils    Penicillin G Rash     Social History   Socioeconomic History   Marital status: Widowed    Spouse name: Not on file   Number of children: 1   Years of education: 12   Highest education level: Not on file  Occupational History   Occupation: RETIRED [employer]  Tobacco Use   Smoking status: Former    Packs/day: 0.50    Years: 56.00    Additional pack years: 0.00    Total pack years: 28.00    Types: Cigarettes    Quit date: 11/13/2021    Years since quitting: 1.0   Smokeless tobacco: Never   Tobacco  comments:    smoking 2 cigarettes/day as of 10/23/20  Vaping Use   Vaping Use: Never used  Substance and Sexual Activity   Alcohol use: No    Alcohol/week: 0.0 standard drinks of alcohol   Drug use: No   Sexual activity: Not on file  Other Topics Concern   Not on file  Social History Narrative   Patient reports it being difficult to pay for everything due to outstanding medical bills from hospital stay last year but states she is able to keep up with basic expenses.  Patient does have some concerns with her house's condition due to a water leak- had her roof repaired last month but has some leaking in the front which has affected her porch.  Patient owns her own car and is able to drive herself but has some concerns about the reliability of her car- has gotten taxi to the clinic for appointment in the past when her car broke down.   Social Determinants of Health   Financial Resource Strain: Low Risk  (06/07/2018)   Overall Financial Resource Strain (CARDIA)    Difficulty of Paying Living Expenses: Not hard at all  Recent Concern: Financial Resource Strain - Medium Risk (05/21/2018)   Overall Financial Resource Strain (CARDIA)    Difficulty of Paying Living Expenses: Somewhat hard  Food Insecurity: No Food Insecurity  (06/09/2022)   Hunger Vital Sign    Worried About Running Out of Food in the Last Year: Never true    Ran Out of Food in the Last Year: Never true  Transportation Needs: No Transportation Needs (06/09/2022)   PRAPARE - Administrator, Civil Service (Medical): No    Lack of Transportation (Non-Medical): No  Physical Activity: Not on file  Stress: Not on file  Social Connections: Not on file  Intimate Partner Violence: Not At Risk (06/09/2022)   Humiliation, Afraid, Rape, and Kick questionnaire    Fear of Current or Ex-Partner: No    Emotionally Abused: No    Physically Abused: No    Sexually Abused: No    Physical Exam      Future Appointments  Date Time Provider Department [facility]  12/07/2022  3:20 PM [facility]  01/17/2023  7:00 AM [facility] REMOTES [facility]  03/07/2023  2:00 PM [name], MD [facility]  04/18/2023  7:05 AM [facility]  07/18/2023  7:00 AM [facility]  10/17/2023  7:00 AM [facility]

## 2022-11-18 NOTE — Pre-Procedure Instructions (Signed)
Attempted to call patient regarding procedure instructions.  No answer 

## 2022-11-19 DIAGNOSIS — J40 Bronchitis, not specified as acute or chronic: Secondary | ICD-10-CM | POA: Diagnosis not present

## 2022-11-19 DIAGNOSIS — R0602 Shortness of breath: Secondary | ICD-10-CM | POA: Diagnosis not present

## 2022-11-21 ENCOUNTER — Encounter (HOSPITAL_COMMUNITY)
Admission: RE | Disposition: A | Discharge status: Home / Self Care | Payer: Self-pay | Source: Home / Self Care | Attending: Internal Medicine

## 2022-11-21 ENCOUNTER — Ambulatory Visit (HOSPITAL_COMMUNITY)
Admission: RE | Admit: 2022-11-21 | Discharge: 2022-11-21 | Disposition: A | Discharge status: Home / Self Care | Payer: Medicare Other | Attending: Internal Medicine | Admitting: Internal Medicine

## 2022-11-21 ENCOUNTER — Ambulatory Visit (HOSPITAL_COMMUNITY): Payer: Medicare Other

## 2022-11-21 DIAGNOSIS — Z7984 Long term (current) use of oral hypoglycemic drugs: Secondary | ICD-10-CM | POA: Diagnosis not present

## 2022-11-21 DIAGNOSIS — N183 Chronic kidney disease, stage 3 unspecified: Secondary | ICD-10-CM | POA: Diagnosis not present

## 2022-11-21 DIAGNOSIS — E1122 Type 2 diabetes mellitus with diabetic chronic kidney disease: Secondary | ICD-10-CM | POA: Diagnosis not present

## 2022-11-21 DIAGNOSIS — I130 Hypertensive heart and chronic kidney disease with heart failure and stage 1 through stage 4 chronic kidney disease, or unspecified chronic kidney disease: Secondary | ICD-10-CM | POA: Insufficient documentation

## 2022-11-21 DIAGNOSIS — I5022 Chronic systolic (congestive) heart failure: Secondary | ICD-10-CM | POA: Insufficient documentation

## 2022-11-21 DIAGNOSIS — Z950 Presence of cardiac pacemaker: Secondary | ICD-10-CM | POA: Diagnosis not present

## 2022-11-21 DIAGNOSIS — R0600 Dyspnea, unspecified: Secondary | ICD-10-CM | POA: Diagnosis not present

## 2022-11-21 DIAGNOSIS — I442 Atrioventricular block, complete: Secondary | ICD-10-CM | POA: Diagnosis not present

## 2022-11-21 DIAGNOSIS — I4891 Unspecified atrial fibrillation: Secondary | ICD-10-CM | POA: Insufficient documentation

## 2022-11-21 DIAGNOSIS — Z952 Presence of prosthetic heart valve: Secondary | ICD-10-CM | POA: Diagnosis not present

## 2022-11-21 DIAGNOSIS — Z87891 Personal history of nicotine dependence: Secondary | ICD-10-CM | POA: Insufficient documentation

## 2022-11-21 DIAGNOSIS — I255 Ischemic cardiomyopathy: Secondary | ICD-10-CM | POA: Diagnosis not present

## 2022-11-21 HISTORY — PX: BIV UPGRADE: EP1202

## 2022-11-21 LAB — GLUCOSE, CAPILLARY
Glucose-Capillary: 117 mg/dL — ABNORMAL HIGH (ref 70–99)
Glucose-Capillary: 135 mg/dL — ABNORMAL HIGH (ref 70–99)

## 2022-11-21 SURGERY — BIV UPGRADE

## 2022-11-21 MED ORDER — METHYLPREDNISOLONE SODIUM SUCC 125 MG IJ SOLR
125.0000 mg | Freq: Once | INTRAMUSCULAR | Status: AC
  Administered 2022-11-21: 125 mg via INTRAVENOUS
  Filled 2022-11-21: qty 2

## 2022-11-21 MED ORDER — VANCOMYCIN HCL IN DEXTROSE 1-5 GM/200ML-% IV SOLN
INTRAVENOUS | Status: AC
  Administered 2022-11-21: 1000 mg via INTRAVENOUS
  Filled 2022-11-21: qty 200

## 2022-11-21 MED ORDER — LIDOCAINE HCL 1 % IJ SOLN
INTRAMUSCULAR | Status: AC
  Filled 2022-11-21: qty 60

## 2022-11-21 MED ORDER — FENTANYL CITRATE (PF) 100 MCG/2ML IJ SOLN
INTRAMUSCULAR | Status: AC
  Filled 2022-11-21: qty 2

## 2022-11-21 MED ORDER — MIDAZOLAM HCL 5 MG/5ML IJ SOLN
INTRAMUSCULAR | Status: AC
  Filled 2022-11-21: qty 5

## 2022-11-21 MED ORDER — DIPHENHYDRAMINE HCL 50 MG/ML IJ SOLN
25.0000 mg | Freq: Once | INTRAMUSCULAR | Status: AC
  Administered 2022-11-21: 25 mg via INTRAVENOUS
  Filled 2022-11-21: qty 1

## 2022-11-21 MED ORDER — HEPARIN (PORCINE) IN NACL 1000-0.9 UT/500ML-% IV SOLN
INTRAVENOUS | Status: DC | PRN
  Administered 2022-11-21: 500 mL

## 2022-11-21 MED ORDER — METOPROLOL TARTRATE 5 MG/5ML IV SOLN
INTRAVENOUS | Status: DC | PRN
  Administered 2022-11-21: 5 mg via INTRAVENOUS

## 2022-11-21 MED ORDER — MIDAZOLAM HCL 5 MG/5ML IJ SOLN
INTRAMUSCULAR | Status: DC | PRN
  Administered 2022-11-21 (×3): 1 mg via INTRAVENOUS

## 2022-11-21 MED ORDER — SODIUM CHLORIDE 0.9 % IV SOLN: INTRAVENOUS | Status: DC

## 2022-11-21 MED ORDER — SODIUM CHLORIDE 0.9 % IV SOLN
INTRAVENOUS | Status: AC
  Administered 2022-11-21: 80 mg
  Filled 2022-11-21: qty 2

## 2022-11-21 MED ORDER — FUROSEMIDE 10 MG/ML IJ SOLN
INTRAMUSCULAR | Status: AC
  Filled 2022-11-21: qty 4

## 2022-11-21 MED ORDER — SODIUM CHLORIDE 0.9 % IV SOLN: 80.0000 mg | INTRAVENOUS | Status: AC

## 2022-11-21 MED ORDER — POVIDONE-IODINE 10 % EX SWAB
2.0000 | Freq: Once | CUTANEOUS | Status: AC
  Administered 2022-11-21: 2 via TOPICAL

## 2022-11-21 MED ORDER — HYDRALAZINE HCL 25 MG PO TABS
25.0000 mg | ORAL_TABLET | Freq: Once | ORAL | Status: AC
  Administered 2022-11-21: 25 mg via ORAL
  Filled 2022-11-21 (×2): qty 1

## 2022-11-21 MED ORDER — ONDANSETRON HCL 4 MG/2ML IJ SOLN: 4.0000 mg | Freq: Four times a day (QID) | INTRAMUSCULAR | Status: DC | PRN

## 2022-11-21 MED ORDER — CHLORHEXIDINE GLUCONATE 4 % EX LIQD
4.0000 | Freq: Once | CUTANEOUS | Status: DC
  Filled 2022-11-21: qty 60

## 2022-11-21 MED ORDER — DIPHENHYDRAMINE HCL 50 MG/ML IJ SOLN
INTRAMUSCULAR | Status: AC
  Filled 2022-11-21: qty 1

## 2022-11-21 MED ORDER — ACETAMINOPHEN 325 MG PO TABS
325.0000 mg | ORAL_TABLET | ORAL | Status: DC | PRN
  Administered 2022-11-21: 650 mg via ORAL
  Filled 2022-11-21: qty 2

## 2022-11-21 MED ORDER — METHYLPREDNISOLONE SODIUM SUCC 125 MG IJ SOLR
INTRAMUSCULAR | Status: AC
  Filled 2022-11-21: qty 2

## 2022-11-21 MED ORDER — FENTANYL CITRATE (PF) 100 MCG/2ML IJ SOLN
INTRAMUSCULAR | Status: DC | PRN
  Administered 2022-11-21 (×3): 12.5 ug via INTRAVENOUS

## 2022-11-21 MED ORDER — METOPROLOL TARTRATE 5 MG/5ML IV SOLN
INTRAVENOUS | Status: AC
  Filled 2022-11-21: qty 5

## 2022-11-21 MED ORDER — VANCOMYCIN HCL IN DEXTROSE 1-5 GM/200ML-% IV SOLN: 1000.0000 mg | INTRAVENOUS | Status: AC

## 2022-11-21 MED ORDER — HYDRALAZINE HCL 25 MG PO TABS
25.0000 mg | ORAL_TABLET | Freq: Once | ORAL | Status: AC
  Administered 2022-11-21: 25 mg via ORAL
  Filled 2022-11-21: qty 1

## 2022-11-21 MED ORDER — FUROSEMIDE 10 MG/ML IJ SOLN
INTRAMUSCULAR | Status: DC | PRN
  Administered 2022-11-21: 40 mg via INTRAVENOUS

## 2022-11-21 MED ORDER — LIDOCAINE HCL (PF) 1 % IJ SOLN
INTRAMUSCULAR | Status: DC | PRN
  Administered 2022-11-21: 50 mL

## 2022-11-21 SURGICAL SUPPLY — 11 items
CABLE SURGICAL S-101-97-12 (CABLE) ×1 IMPLANT
GUIDEWIRE ANGLED .035X150CM (WIRE) IMPLANT
KIT ACCESSORY SELECTRA FIX CVD (MISCELLANEOUS) IMPLANT
KIT MICROPUNCTURE NIT STIFF (SHEATH) IMPLANT
PAD DEFIB RADIO PHYSIO CONN (PAD) ×1 IMPLANT
SELECTRA EXT HOOK 45 CM 375527 (CATHETERS) IMPLANT
SHEATH 7FR PRELUDE SNAP 13 (SHEATH) IMPLANT
SHEATH 8FR PRELUDE SNAP 13 (SHEATH) IMPLANT
SHEATH 9FR PRELUDE SNAP 13 (SHEATH) IMPLANT
TRAY PACEMAKER INSERTION (PACKS) ×1 IMPLANT
WIRE MICRO SET SILHO 5FR 7 (SHEATH) IMPLANT

## 2022-11-21 NOTE — Discharge Instructions (Signed)
Implantable Cardiac Device SITE, Care After  This sheet gives you information about how to care for yourself after your procedure. Your health care provider may also give you more specific instructions. If you have problems or questions, contact your health care provider. What can I expect after the procedure? After your procedure, it is common to have: Pain or soreness at the site where the cardiac device was inserted. Swelling at the site where the cardiac device was inserted. You should received an information card for your new device in 4-8 weeks. Follow these instructions at home: Incision care  Keep the incision clean and dry. REMOVE OUTER DRESSING IN 24-48 HOURS  Do not take baths, swim, or use a hot tub until after your wound check.  Do not shower for at least 7 days, or as directed by your health care provider. Pat the area dry with a clean towel. Do not rub the area. This may cause bleeding. Follow instructions from your health care provider about how to take care of your incision. Make sure you: Leave stitches (sutures), skin glue, or adhesive strips in place. These skin closures may need to stay in place for 2 weeks or longer. If adhesive strip edges start to loosen and curl up, you may trim the loose edges. Do not remove adhesive strips completely unless your health care provider tells you to do that. Check your incision area every day for signs of infection. Check for: More redness, swelling, or pain. More fluid or blood. Warmth. Pus or a bad smell. Activity Do not lift anything that is heavier than 10 lb (4.5 kg) until your health care provider says it is okay to do so. For the first week, or as long as told by your health care provider: Avoid lifting your affected arm higher than your shoulder. After 1 week, Be gentle when you move your arms over your head. It is okay to raise your arm to comb your hair. Avoid strenuous exercise. Ask your health care provider when it is  okay to: Resume your normal activities. Return to work or school. Resume sexual activity. Eating and drinking Eat a heart-healthy diet. This should include plenty of fresh fruits and vegetables, whole grains, low-fat dairy products, and lean protein like chicken and fish. Limit alcohol intake to no more than 1 drink a day for non-pregnant women and 2 drinks a day for men. One drink equals 12 oz of beer, 5 oz of wine, or 1 oz of hard liquor. Check ingredients and nutrition facts on packaged foods and beverages. Avoid the following types of food: Food that is high in salt (sodium). Food that is high in saturated fat, like full-fat dairy or red meat. Food that is high in trans fat, like fried food. Food and drinks that are high in sugar. Lifestyle Do not use any products that contain nicotine or tobacco, such as cigarettes and e-cigarettes. If you need help quitting, ask your health care provider. Take steps to manage and control your weight. Once cleared, get regular exercise. Aim for 150 minutes of moderate-intensity exercise (such as walking or yoga) or 75 minutes of vigorous exercise (such as running or swimming) each week. Manage other health problems, such as diabetes or high blood pressure. Ask your health care provider how you can manage these conditions. General instructions Do not drive for 24 hours after your procedure if you were given a medicine to help you relax (sedative). Take over-the-counter and prescription medicines only as told by your  health care provider. Avoid putting pressure on the area where the cardiac device was placed. If you need an MRI after your cardiac device has been placed, be sure to tell the health care provider who orders the MRI that you have a cardiac device. Avoid close and prolonged exposure to electrical devices that have strong magnetic fields. These include: Cell phones. Avoid keeping them in a pocket near the cardiac device, and try using the ear  opposite the cardiac device. MP3 players. Household appliances, like microwaves. Metal detectors. Electric generators. High-tension wires. Keep all follow-up visits as directed by your health care provider. This is important. Contact a health care provider if: You have pain at the incision site that is not relieved by over-the-counter or prescription medicines. You have any of these around your incision site or coming from it: More redness, swelling, or pain. Fluid or blood. Warmth to the touch. Pus or a bad smell. You have a fever. You feel brief, occasional palpitations, light-headedness, or any symptoms that you think might be related to your heart. Get help right away if: You experience chest pain that is different from the pain at the cardiac device site. You develop a red streak that extends above or below the incision site. You experience shortness of breath. You have palpitations or an irregular heartbeat. You have light-headedness that does not go away quickly. You faint or have dizzy spells. Your pulse suddenly drops or increases rapidly and does not return to normal. You begin to gain weight and your legs and ankles swell. Summary After your procedure, it is common to have pain, soreness, and some swelling where the cardiac device was inserted. Make sure to keep your incision clean and dry. Follow instructions from your health care provider about how to take care of your incision. Check your incision every day for signs of infection, such as more pain or swelling, pus or a bad smell, warmth, or leaking fluid and blood. Avoid strenuous exercise and lifting your left arm higher than your shoulder for 2 weeks, or as long as told by your health care provider. This information is not intended to replace advice given to you by your health care provider. Make sure you discuss any questions you have with your health care provider.

## 2022-11-21 NOTE — Progress Notes (Signed)
On arrival pt does not answer and does not open her eyes, she is on 2 LNC Spo2 is 93% currently. We checked her cbg 153, she mumbles but not coherently. I did a light sternal rub and I was able to coax her into opening her eyes. I did get her to squeeze my fingers/ normal bilateral grip and could push and pull both feet with good strength. Will continue to monitor.

## 2022-11-21 NOTE — H&P (Signed)
    HPI [name] [name] returns today for followup. She is a pleasant 77 yo woman with a h/o HTN and atrial fib with an uncontrolled VR. She developed a tachy induced CM and underwent AV node ablation and insertion of a left bundle area single chamber PPM. She has had progressive sob and worsening LV function and CHF symptoms. She is thought to have permanent atrial fib. She denies chest pain but has class 3 dyspnea. She has been treated with maximal medical therapy under the direction of Dr. DB. Her EF is 25%.  Allergies  Allergen Reactions   Bee Venom Anaphylaxis   Ivp Dye [Iodinated Contrast Media] Anaphylaxis   Ace Inhibitors     Other reaction(s): angioedema   Atenolol     Other reaction(s): severe headaches   Codeine Nausea And Vomiting   Entresto [Sacubitril-Valsartan] Other (See Comments)    Chest pain    Penicillin G     Other reaction(s): rash   Shrimp [Shellfish Allergy] Swelling   Simvastatin     Other reaction(s): not sure   Jardiance [Empagliflozin] Other (See Comments)    Caused Boils      Current Outpatient Medications  Medication Sig Dispense Refill   acetaminophen (TYLENOL) 325 MG tablet Take 2 tablets (650 mg total) by mouth every 4 (four) hours as needed for headache or mild pain.     albuterol (VENTOLIN HFA) 108 (90 Base) MCG/ACT inhaler Inhale 1-2 puffs into the lungs every 6 (six) hours as needed for wheezing or shortness of breath. 18 g 2   allopurinol (ZYLOPRIM) 100 MG tablet Take 100 mg by mouth daily as needed (gout symptoms).     atorvastatin (LIPITOR) 10 MG tablet Take 1 tablet (10 mg total) by mouth at bedtime. 90 tablet 1   carboxymethylcellulose (REFRESH PLUS) 0.5 % SOLN Place 1 drop into both eyes daily as needed (dry eyes).     hydrALAZINE (APRESOLINE) 25 MG tablet Take 1 tablet (25 mg total) by mouth 3 (three) times daily. 90 tablet 0   isosorbide mononitrate (IMDUR) 30 MG 24 hr tablet Take 1 tablet (30 mg total) by mouth daily. 30 tablet 0    losartan (COZAAR) 50 MG tablet Take 1 tablet (50 mg total) by mouth daily. 30 tablet 6   metFORMIN (GLUCOPHAGE) 500 MG tablet Take 500 mg by mouth daily with breakfast. Daily with a meal     pantoprazole (PROTONIX) 40 MG tablet Take 1 tablet (40 mg total) by mouth daily. 30 tablet 2   rivaroxaban (XARELTO) 20 MG TABS tablet Take 1 tablet (20 mg total) by mouth daily with supper. Resume from 11/01/2019     spironolactone (ALDACTONE) 25 MG tablet Take 0.5 tablets (12.5 mg total) by mouth daily.     torsemide (DEMADEX) 20 MG tablet Take 1 tablet (20 mg total) by mouth daily. 30 tablet 1   TRELEGY ELLIPTA 100-62.5-25 MCG/ACT AEPB Inhale ONE PUFF into THE lungs daily 120 each 1   No current facility-administered medications for this visit.     Past Medical History:  Diagnosis Date   (HFimpEF) heart failure with improved ejection fraction (HCC)    a. 07/2018 Echo: EF 30-35%; b. 11/2019 Echo: EF 55-60%, no rwma, Gr2 DD, Nl RV size/fxn. Mild BAE. Mild MR/AI.   Arthritis    Asthma    Atopic dermatitis    CAD (coronary artery disease)    a. 2016 s/p CABG x 2 (LIMA->LAD, VG->OM); b. 08/2018 MV: EF 44%, no ischemia/infact.     Cardiac arrest (HCC) 10/2014   Cardiomyopathy, ischemic    a. 07/2018 Echo: EF 30-35%; 11/2019 Echo: EF 55-60%.   Carotid arterial disease (HCC)    a. 11/2019 Carotid U/S   CHF (congestive heart failure) (HCC)    CKD (chronic kidney disease), stage III (HCC)    COPD (chronic obstructive pulmonary disease) (HCC)    Diabetes mellitus without complication (HCC)    Dysrhythmia    Esophageal dilatation 2013   GERD (gastroesophageal reflux disease)    Gout    Headache    Hypertension    Hypokalemia    Idiopathic angioedema    LGI bleed 08/06/2017   a. felt to be hemorrhoidal during that admission (no drop in Hgb).   Lower back pain    Paroxysmal atrial fibrillation (HCC)    a. Dx 2016-->h/o difficult to control rates (complicated by noncompliance), not felt to be a candidate  for ablation or antiarrhythmic due to noncompliance; b. Recurrent AF 2021 - converted w/ IV dilt; c. CHA2DS2VASc = 7-->Xarelto.   Personal history of noncompliance with medical treatment, presenting hazards to health    Prediabetes    Presence of permanent cardiac pacemaker    S/P CABG x 2 with clipping of LA appendage 11/14/2014   LIMA to LAD, SVG to OM, EVH via right thigh   Urine incontinence    Uterine fibroid     ROS:   All systems reviewed and negative except as noted in the HPI.   Past Surgical History:  Procedure Laterality Date   AV NODE ABLATION N/A 01/18/2021   Procedure: AV NODE ABLATION;  Surgeon: Casin Federici W, MD;  Location: [facility];  Service: Cardiovascular;  Laterality: N/A;   BALLOON DILATION N/A 10/10/2019   Procedure: BALLOON DILATION;  Surgeon: Brahmbhatt, Parag, MD;  Location: [facility];  Service: Gastroenterology;  Laterality: N/A;   BIOPSY  10/10/2019   Procedure: BIOPSY;  Surgeon: Brahmbhatt, Parag, MD;  Location: [facility];  Service: Gastroenterology;;   BIOPSY  10/11/2019   Procedure: BIOPSY;  Surgeon: Brahmbhatt, Parag, MD;  Location: [facility];  Service: Gastroenterology;;   CARDIOVERSION N/A 11/18/2014   Procedure: CARDIOVERSION;  Surgeon: Kenneth C Hilty, MD;  Location: [facility];  Service: Cardiovascular;  Laterality: N/A;   CARDIOVERSION N/A 12/25/2017   Procedure: CARDIOVERSION;  Surgeon: Bensimhon, Daniel R, MD;  Location: [facility];  Service: Cardiovascular;  Laterality: N/A;   CLIPPING OF ATRIAL APPENDAGE N/A 11/14/2014   Procedure: CLIPPING OF ATRIAL APPENDAGE;  Surgeon: Clarence H Owen, MD;  Location: [facility];  Service: Open Heart Surgery;  Laterality: N/A;   COLONOSCOPY  2013   COLONOSCOPY WITH PROPOFOL N/A 10/11/2019   Procedure: COLONOSCOPY WITH PROPOFOL;  Surgeon: Brahmbhatt, Parag, MD;  [facility]: [facility];  Service: Gastroenterology;  Laterality: N/A;   CORONARY ARTERY BYPASS GRAFT N/A 11/14/2014   Procedure:  CORONARY ARTERY BYPASS GRAFTING (CABG)TIMES 2 USING LEFT INTERNAL MAMMARY ARTERY AND RIGHT SAPHENOUS VEIN HARVESTED ENDOSCOPICALLY;  Surgeon: Clarence H Owen, MD;  Location: [facility];  Service: Open Heart Surgery;  Laterality: N/A;   ESOPHAGOGASTRODUODENOSCOPY (EGD) WITH PROPOFOL N/A 10/10/2019   Procedure: ESOPHAGOGASTRODUODENOSCOPY (EGD) WITH PROPOFOL;  Surgeon: Brahmbhatt, Parag, MD;  [facility];  Service: Gastroenterology;  Laterality: N/A;   ESOPHAGOGASTRODUODENOSCOPY (EGD) WITH PROPOFOL N/A 10/27/2019   Procedure: ESOPHAGOGASTRODUODENOSCOPY (EGD) WITH PROPOFOL;  Surgeon: Karki, Arya, MD;  [facility];  Service: Gastroenterology;  Laterality: N/A;   GIVENS CAPSULE STUDY N/A 10/27/2019   Procedure: GIVENS CAPSULE STUDY;  Surgeon: Karki,   Arya, MD;  [facility]: [facility];  Service: Gastroenterology;  Laterality: N/A;   INSERT / REPLACE / REMOVE PACEMAKER     LEFT HEART CATHETERIZATION WITH CORONARY ANGIOGRAM N/A 11/04/2014   Procedure: LEFT HEART CATHETERIZATION WITH CORONARY ANGIOGRAM;  Surgeon: Thomas A Kelly, MD;  [facility]: [facility];  Service: Cardiovascular;  Laterality: N/A;   PACEMAKER IMPLANT N/A 01/18/2021   Procedure: PACEMAKER IMPLANT;  Surgeon: Tessla Spurling W, MD;  [facility];  Service: Cardiovascular;  Laterality: N/A;   PACEMAKER IMPLANT Left    POLYPECTOMY  10/11/2019   Procedure: POLYPECTOMY;  Surgeon: Brahmbhatt, Parag, MD;  [facility];  Service: Gastroenterology;;   RIGHT HEART CATH N/A 06/08/2022   Procedure: RIGHT HEART CATH;  Surgeon: Bensimhon, Daniel R, MD;  [facility];  Service: Cardiovascular;  Laterality: N/A;   TEE WITHOUT CARDIOVERSION N/A 11/14/2014   Procedure: TRANSESOPHAGEAL ECHOCARDIOGRAM (TEE);  Surgeon: Clarence H Owen, MD;  [facility];  Service: Open Heart Surgery;  Laterality: N/A;   TEE WITHOUT CARDIOVERSION N/A 12/25/2017   Procedure: TRANSESOPHAGEAL ECHOCARDIOGRAM (TEE);   Surgeon: Bensimhon, Daniel R, MD;  [facility];  Service: Cardiovascular;  Laterality: N/A;   TEMPORARY PACEMAKER INSERTION  11/04/2014   Procedure: TEMPORARY PACEMAKER INSERTION;  Surgeon: Thomas A Kelly, MD;  [facility]: [facility];  Service: Cardiovascular;;   TOOTH EXTRACTION       Family History  Problem Relation Age of Onset   Cancer Mother        LYMPHOMA   Heart disease Father    Other Father        TB   CVA Sister    Prostate cancer Brother 81   Diabetes Brother      Social History   Socioeconomic History   Marital status: Widowed    Spouse name: Not on file   Number of children: 1   Years of education: 11   Highest education level: Not on file  Occupational History   Occupation: RETIRED [employer]  Tobacco Use   Smoking status: Former    Packs/day: 0.50    Years: 56.00    Total pack years: 28.00    Types: Cigarettes    Quit date: 11/13/2021    Years since quitting: 0.6   Smokeless tobacco: Never   Tobacco comments:    smoking 2 cigarettes/day as of 10/23/20  Vaping Use   Vaping Use: Never used  Substance and Sexual Activity   Alcohol use: No    Alcohol/week: 0.0 standard drinks of alcohol   Drug use: No   Sexual activity: Not on file  Other Topics Concern   Not on file  Social History Narrative   Patient reports it being difficult to pay for everything due to outstanding medical bills from hospital stay last year but states she is able to keep up with basic expenses.  Patient does have some concerns with her house's condition due to a water leak- had her roof repaired last month but has some leaking in the front which has affected her porch.  Patient owns her own car and is able to drive herself but has some concerns about the reliability of her car- has gotten taxi to the clinic for appointment in the past when her car broke down.   Social Determinants of Health   Financial Resource Strain: Low Risk  (06/07/2018)   Overall Financial  Resource Strain (CARDIA)    Difficulty of Paying Living Expenses: Not hard   at all  Recent Concern: Financial Resource Strain - Medium Risk (05/21/2018)   Overall Financial Resource Strain (CARDIA)    Difficulty of Paying Living Expenses: Somewhat hard  Food Insecurity: No Food Insecurity (06/09/2022)   Hunger Vital Sign    Worried About Running Out of Food in the Last Year: Never true    Ran Out of Food in the Last Year: Never true  Transportation Needs: No Transportation Needs (06/09/2022)   PRAPARE - Transportation    Lack of Transportation (Medical): No    Lack of Transportation (Non-Medical): No  Physical Activity: Not on file  Stress: Not on file  Social Connections: Not on file  Intimate Partner Violence: Not At Risk (06/09/2022)   Humiliation, Afraid, Rape, and Kick questionnaire    Fear of Current or Ex-Partner: No    Emotionally Abused: No    Physically Abused: No    Sexually Abused: No     BP 130/76   Pulse 76   Wt 185 lb (83.9 kg)   BMI 28.98 kg/m   Physical Exam:  Well appearing NAD HEENT: Unremarkable Neck:  No JVD, no thyromegally Lymphatics:  No adenopathy Back:  No CVA tenderness Lungs:  Clear HEART:  Regular rate rhythm, no murmurs, no rubs, no clicks Abd:  soft, positive bowel sounds, no organomegally, no rebound, no guarding Ext:  2 plus pulses, no edema, no cyanosis, no clubbing Skin:  No rashes no nodules Neuro:  CN II through XII intact, motor grossly intact  EKG - atrial fib with ventricular pacing  DEVICE  Normal device function.  See PaceArt for details.   Assess/Plan: Tachy induced CM - we have controlled her VR but her EF has not improved. She has some dysynchrony on 2D echo and her EF remains down. Chronic systolic heart failure - I have discussed the treatment options and recommended upgrade to a Biv ICD. I have discussed the indications/risks/benefits/goals/expectations and she wishes to proceed. Coags - we will hold her blood thinners  prior to her procedure.  CHB - she is s/p AV node ablation with minimal escape.   Darey Hershberger,MD 

## 2022-11-22 ENCOUNTER — Encounter (HOSPITAL_COMMUNITY): Payer: Self-pay | Admitting: Internal Medicine

## 2022-11-22 ENCOUNTER — Telehealth (HOSPITAL_COMMUNITY): Payer: Self-pay | Admitting: Emergency Medicine

## 2022-11-22 NOTE — Telephone Encounter (Signed)
Called Ms Shann to remind her of  home visit today and she advised that she was not going to be able to keep the appointment because she "had errands to run."  She did not wish to reschedule at this time.     [name], EMT-Paramedic [phone] 11/22/2022

## 2022-11-23 ENCOUNTER — Other Ambulatory Visit: Payer: Self-pay | Admitting: Podiatry

## 2022-11-23 DIAGNOSIS — I739 Peripheral vascular disease, unspecified: Secondary | ICD-10-CM

## 2022-11-23 MED ORDER — GABAPENTIN 100 MG PO CAPS: 100.0000 mg | ORAL_CAPSULE | Freq: Every day | ORAL | 0 refills | Status: DC

## 2022-11-23 NOTE — Progress Notes (Signed)
Remote pacemaker transmission.   

## 2022-11-25 ENCOUNTER — Other Ambulatory Visit: Payer: Self-pay | Admitting: Internal Medicine

## 2022-11-30 ENCOUNTER — Telehealth: Payer: Self-pay | Admitting: Internal Medicine

## 2022-11-30 DIAGNOSIS — I4819 Other persistent atrial fibrillation: Secondary | ICD-10-CM

## 2022-11-30 MED ORDER — RIVAROXABAN 15 MG PO TABS
15.0000 mg | ORAL_TABLET | Freq: Every day | ORAL | 0 refills | Status: DC
Start: 2022-11-30 — End: 2022-12-16

## 2022-11-30 NOTE — Telephone Encounter (Signed)
Called pt inform had 3 weeks of samples, but need to make f/u appt since sge was due in March and did not come. Made appt for 12/12/22 @ 4.Marland KitchenRaechel Chute

## 2022-11-30 NOTE — Telephone Encounter (Signed)
Patient is wanting to know if we have any more samples of xarelto - she has not had her medication in 2 days.  Please call patient:  [phone]

## 2022-11-30 NOTE — Telephone Encounter (Signed)
Created in error

## 2022-12-02 ENCOUNTER — Other Ambulatory Visit (HOSPITAL_COMMUNITY): Payer: Self-pay | Admitting: Emergency Medicine

## 2022-12-02 NOTE — Progress Notes (Signed)
Paramedicine Encounter    Patient ID: [name] [name], female    DOB: 1946-08-10, 76 y.o.   MRN: [id number]   Complaints  NONE  Assessment A&O x 4, skin W&D w/ good color.  Lung sounds clear bilat.  No peripheral edema noted.   Compliance with meds  YES per pt.   Pill box filled pt doesn't do pill box She takes from bottles herself  Refills needed Torsemide  Meds changes since last visit NONE    Social changes NONE   BP (!) 140/70 (BP Location: Left Arm, Patient Position: Sitting, Cuff Size: Normal)   Pulse (!) 51   Resp 16   Wt 182 lb 3.2 oz (82.6 kg)   SpO2 97%   BMI 28.12 kg/m  Weight yesterday-not takne Last visit weight-182lb Cbg 129  Home visit with [name] [name] today.  She reports she is feeling well.  I did a review of her meds with [facility] by phone to determine refills needed.  Only needs Torsemide at this time.  She does not use pill box.   She denies chest pain or SOB.  Lung sounds clear and equal bilat. No edema noted.   Reviewed pending appt for next week with Dr. Ladona Ridgel for wound check  from her recent device surgery.  Site looks good w/ no redness, swelling or ozzing noted.   ACTION: Home visit completed  [name] [phone] 12/02/22  Patient Care Team: [name], MD as PCP - General (Internal Medicine) [name], MD as PCP - Cardiology (Cardiology) [name], MD as PCP - Electrophysiology (Cardiology) Chrystie Nose, MD as Consulting Physician (Cardiology)  Patient Active Problem List   Diagnosis Date Noted   Ceruminosis, bilateral 06/17/2022   Flu vaccine need 06/17/2022   Chronic combined systolic and diastolic CHF (congestive heart failure) 06/17/2022   Encounter for general adult medical examination with abnormal findings 06/16/2022   Chronic anticoagulation 06/06/2022   Severe mitral regurgitation 06/06/2022   Controlled type 2 diabetes mellitus without complication, without long-term current use of  insulin 06/06/2022   Pacemaker 02/22/2022   Obstructive sleep apnea 09/21/2020   Obesity (BMI 30.0-34.9) 02/10/2020   COPD (chronic obstructive pulmonary disease) 10/26/2019   Thrombocytopenia 09/14/2017   Medication noncompliance due to cognitive impairment 09/06/2017   Persistent atrial fibrillation 08/04/2017   Paroxysmal atrial fibrillation 02/08/2017   Cardiomyopathy, ischemic 02/08/2017   CKD (chronic kidney disease), stage III 01/30/2017   Coronary artery disease due to lipid rich plaque    Essential hypertension     Current Outpatient Medications:    acetaminophen (TYLENOL) 500 MG tablet, Take 500 mg by mouth every 6 (six) hours as needed for moderate pain., Disp: , Rfl:    allopurinol (ZYLOPRIM) 100 MG tablet, Take 100 mg by mouth daily., Disp: , Rfl:    atorvastatin (LIPITOR) 10 MG tablet, Take 1 tablet (10 mg total) by mouth at bedtime., Disp: 90 tablet, Rfl: 1   gabapentin (NEURONTIN) 100 MG capsule, Take 1 capsule (100 mg total) by mouth at bedtime., Disp: 90 capsule, Rfl: 0   hydrALAZINE (APRESOLINE) 25 MG tablet, Take 1 tablet (25 mg total) by mouth 3 (three) times daily., Disp: 270 tablet, Rfl: 1   losartan (COZAAR) 50 MG tablet, Take 1 tablet (50 mg total) by mouth daily., Disp: 30 tablet, Rfl: 6   metFORMIN (GLUCOPHAGE) 500 MG tablet, TAKE 1 TABLET (500 MG TOTAL) BY MOUTH DAILY WITH BREAKFAST (MEAL), Disp: 90 tablet, Rfl: 0  pantoprazole (PROTONIX) 40 MG tablet, Take 1 tablet (40 mg total) by mouth daily. Follow-up appt due in Marcht must see provider for future refills, Disp: 90 tablet, Rfl: 0   Rivaroxaban (XARELTO) 15 MG TABS tablet, Take 1 tablet (15 mg total) by mouth daily with supper. NDC # [id number] Lot #  [id number] Exp- 09/16/23, Disp: 21 tablet, Rfl: 0   spironolactone (ALDACTONE) 25 MG tablet, Take 1 tablet (25 mg total) by mouth daily., Disp: 30 tablet, Rfl: 11   sucralfate (CARAFATE) 1 GM/10ML suspension, Take 10 mLs (1 g total) by mouth 4 (four) times  daily -  with meals and at bedtime. (Patient taking differently: Take 1 g by mouth daily as needed (indigestion).), Disp: 420 mL, Rfl: 0   torsemide (DEMADEX) 20 MG tablet, TAKE 1 TABLET BY MOUTH EVERY DAY, Disp: 90 tablet, Rfl: 0   acetaminophen (TYLENOL) 325 MG tablet, Take 2 tablets (650 mg total) by mouth every 4 (four) hours as needed for headache or mild pain. (Patient not taking: Reported on 12/02/2022), Disp: , Rfl:    albuterol (VENTOLIN HFA) 108 (90 Base) MCG/ACT inhaler, Inhale 1-2 puffs into the lungs every 6 (six) hours as needed for wheezing or shortness of breath. (Patient not taking: Reported on 12/02/2022), Disp: 18 g, Rfl: 2   isosorbide mononitrate (IMDUR) 30 MG 24 hr tablet, Take 1 tablet (30 mg total) by mouth daily. (Patient not taking: Reported on 11/01/2022), Disp: 30 tablet, Rfl: 11   Naphazoline HCl (CLEAR EYES OP), Place 1 drop into both eyes daily as needed (irritation). (Patient not taking: Reported on 12/02/2022), Disp: , Rfl:    TRELEGY ELLIPTA 100-62.5-25 MCG/ACT AEPB, Inhale ONE PUFF into THE lungs daily (Patient not taking: Reported on 12/02/2022), Disp: 120 each, Rfl: 1 Allergies  Allergen Reactions   Bee Venom Anaphylaxis   Ivp Dye [Iodinated Contrast Media] Anaphylaxis   Ace Inhibitors     angioedema   Atenolol     severe headaches   Codeine Nausea And Vomiting   Entresto [Sacubitril-Valsartan] Other (See Comments)    Chest pain    Isosorbide     Severe headaches   Shrimp [Shellfish Allergy] Swelling   Simvastatin     not sure   Jardiance [Empagliflozin] Other (See Comments)    Caused Boils    Penicillin G Rash     Social History   Socioeconomic History   Marital status: Widowed    Spouse name: Not on file   Number of children: 1   Years of education: 58   Highest education level: Not on file  Occupational History   Occupation: RETIRED [employer]  Tobacco Use   Smoking status: Former    Packs/day: 0.50    Years: 56.00    Additional  pack years: 0.00    Total pack years: 28.00    Types: Cigarettes    Quit date: 11/13/2021    Years since quitting: 1.0   Smokeless tobacco: Never   Tobacco comments:    smoking 2 cigarettes/day as of 10/23/20  Vaping Use   Vaping Use: Never used  Substance and Sexual Activity   Alcohol use: No    Alcohol/week: 0.0 standard drinks of alcohol   Drug use: No   Sexual activity: Not on file  Other Topics Concern   Not on file  Social History Narrative   Patient reports it being difficult to pay for everything due to outstanding medical bills from hospital stay last year but states she is  able to keep up with basic expenses.  Patient does have some concerns with her house's condition due to a water leak- had her roof repaired last month but has some leaking in the front which has affected her porch.  Patient owns her own car and is able to drive herself but has some concerns about the reliability of her car- has gotten taxi to the clinic for appointment in the past when her car broke down.   Social Determinants of Health   Financial Resource Strain: Low Risk  (06/07/2018)   Overall Financial Resource Strain (CARDIA)    Difficulty of Paying Living Expenses: Not hard at all  Recent Concern: Financial Resource Strain - Medium Risk (05/21/2018)   Overall Financial Resource Strain (CARDIA)    Difficulty of Paying Living Expenses: Somewhat hard  Food Insecurity: No Food Insecurity (06/09/2022)   Hunger Vital Sign    Worried About Running Out of Food in the Last Year: Never true    Ran Out of Food in the Last Year: Never true  Transportation Needs: No Transportation Needs (06/09/2022)   PRAPARE - Administrator, Civil Service (Medical): No    Lack of Transportation (Non-Medical): No  Physical Activity: Not on file  Stress: Not on file  Social Connections: Not on file  Intimate Partner Violence: Not At Risk (06/09/2022)   Humiliation, Afraid, Rape, and Kick questionnaire    Fear of  Current or Ex-Partner: No    Emotionally Abused: No    Physically Abused: No    Sexually Abused: No    Physical Exam      Future Appointments  Date Time Provider Department Center  12/07/2022  3:20 PM [facility] DEVICE 1 [facility]  12/12/2022  4:00 PM [name], MD [facility] None  01/17/2023  7:00 AM [facility] DEVICE REMOTES [facility]  03/07/2023  2:00 PM [name], MD [facility]  04/18/2023  7:05 AM [facility] DEVICE REMOTES [facility]  07/18/2023  7:00 AM [facility] DEVICE REMOTES [facility]  10/17/2023  7:00 AM [facility] DEVICE REMOTES [facility]

## 2022-12-05 ENCOUNTER — Telehealth: Payer: Self-pay | Admitting: Internal Medicine

## 2022-12-05 NOTE — Telephone Encounter (Signed)
Per message received from [facility], information taken to Device to address Bio PPM and shortness of breath concern.    Pt BiV PPM upgrade did not happen on 11/21/2022.  Pt needs follow up appointment with Dr. Ladona Ridgel in clinic to discuss symptoms and next steps with her POC.  Pt had wound check scheduled for 12/07/2022.    Per [place], wound check 12/07/2022 was cancelled, and Pt added to Dr. Ladona Ridgel 12/08/2022 schedule at 1130 am to discuss next steps / possible lead revision?  Pt agreed to 1130 am, 4/25 visit, and advised that 12/07/22 appointment has been cancelled.  Pt understood and agreed with POC / scheduling.

## 2022-12-05 NOTE — Telephone Encounter (Signed)
Pt c/o Shortness Of Breath: STAT if SOB developed within the last 24 hours or pt is noticeably SOB on the phone  1. Are you currently SOB (can you hear that pt is SOB on the phone)? Yes  2. How long have you been experiencing SOB? States she has been sob since the implant on 4/8. She says prior to 4/8, she was sob but states she can tell a difference in it. She says its shorter, like she has to breathe heavier.   3. Are you SOB when sitting or when up moving around? More so moving around, but it is there anytime.   4. Are you currently experiencing any other symptoms? States she has a little pain, says it is everywhere. But thinks it is constipation from what she ate. She went and got a suppository, and states it did help. No lightheadedness, dizziness.    Call transferred to triage.

## 2022-12-05 NOTE — Telephone Encounter (Signed)
Received call transferred from operator and spoke with patient.  She reports it has been a little harder for her to breath since having procedure on 4/8.  Not worsened recently.  Is taking torsemide as ordered.  No swelling.  No weight gain. Was having pain all over but this improved after she had a bowel movement.   Patient reports at time of procedure on 4/8 Dr Ladona Ridgel told her that her "lungs were stopped up"  and she would be given medicine for this.  Patient follows in CHF clinic but requests message be sent to Dr Ladona Ridgel regarding her breathing.

## 2022-12-07 ENCOUNTER — Ambulatory Visit: Payer: Medicare Other

## 2022-12-08 ENCOUNTER — Encounter: Payer: HMO | Admitting: Internal Medicine

## 2022-12-08 ENCOUNTER — Ambulatory Visit: Payer: Medicare Other | Attending: Internal Medicine | Admitting: Internal Medicine

## 2022-12-08 VITALS — BP 150/88 | HR 86 | Resp 20 | Wt 184.0 lb

## 2022-12-08 DIAGNOSIS — I4819 Other persistent atrial fibrillation: Secondary | ICD-10-CM

## 2022-12-08 DIAGNOSIS — I10 Essential (primary) hypertension: Secondary | ICD-10-CM

## 2022-12-08 DIAGNOSIS — I5042 Chronic combined systolic (congestive) and diastolic (congestive) heart failure: Secondary | ICD-10-CM | POA: Diagnosis not present

## 2022-12-08 DIAGNOSIS — Z950 Presence of cardiac pacemaker: Secondary | ICD-10-CM

## 2022-12-08 DIAGNOSIS — Z01812 Encounter for preprocedural laboratory examination: Secondary | ICD-10-CM | POA: Diagnosis not present

## 2022-12-08 MED ORDER — TORSEMIDE 20 MG PO TABS
30.0000 mg | ORAL_TABLET | Freq: Every day | ORAL | 2 refills | Status: DC
Start: 2022-12-08 — End: 2022-12-14

## 2022-12-08 NOTE — Progress Notes (Signed)
HPI [name] returns today for followup. She is a pleasant 77 yo woman with a h/o chronic systolic heart failure, perm atrial fib with a RVR, and is s/p insertion of a single chamber PPM and an AV node ablation. 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 The patient returns today to discuss her treatment options. We turned her pacer to VVI 40 after her procedure after it was noted that her underlying QRS was narrow and her rate was in the 50-60 range. She has felt both weak and sob.  Allergies  Allergen Reactions   Bee Venom Anaphylaxis   Ivp Dye [Iodinated Contrast Media] Anaphylaxis   Ace Inhibitors     angioedema   Atenolol     severe headaches   Codeine Nausea And Vomiting   Entresto [Sacubitril-Valsartan] Other (See Comments)    Chest pain    Isosorbide     Severe headaches   Shrimp [Shellfish Allergy] Swelling   Simvastatin     not sure   Jardiance [Empagliflozin] Other (See Comments)    Caused Boils    Penicillin G Rash     Current Outpatient Medications  Medication Sig Dispense Refill   acetaminophen (TYLENOL) 325 MG tablet Take 2 tablets (650 mg total) by mouth every 4 (four) hours as needed for headache or mild pain. (Patient not taking: Reported on 12/02/2022)     acetaminophen (TYLENOL) 500 MG tablet Take 500 mg by mouth every 6 (six) hours as needed for moderate pain.     albuterol (VENTOLIN HFA) 108 (90 Base) MCG/ACT inhaler Inhale 1-2 puffs into the lungs every 6 (six) hours as needed for wheezing or shortness of breath. (Patient not taking: Reported on 12/02/2022) 18 g 2   allopurinol (ZYLOPRIM) 100 MG tablet Take 100 mg by mouth daily.     atorvastatin (LIPITOR) 10 MG tablet Take 1 tablet (10 mg total) by mouth at bedtime. 90 tablet 1   gabapentin (NEURONTIN) 100 MG capsule Take 1 capsule (100 mg total) by mouth at bedtime. 90  capsule 0   hydrALAZINE (APRESOLINE) 25 MG tablet Take 1 tablet (25 mg total) by mouth 3 (three) times daily. 270 tablet 1   isosorbide mononitrate (IMDUR) 30 MG 24 hr tablet Take 1 tablet (30 mg total) by mouth daily. (Patient not taking: Reported on 11/01/2022) 30 tablet 11   losartan (COZAAR) 50 MG tablet Take 1 tablet (50 mg total) by mouth daily. 30 tablet 6   metFORMIN (GLUCOPHAGE) 500 MG tablet TAKE 1 TABLET (500 MG TOTAL) BY MOUTH DAILY WITH BREAKFAST (MEAL) 90 tablet 0   Naphazoline HCl (CLEAR EYES OP) Place 1 drop into both eyes daily as needed (irritation). (Patient not taking: Reported on 12/02/2022)     pantoprazole (PROTONIX) 40 MG tablet Take 1 tablet (40 mg total) by mouth daily. Follow-up appt due in Marcht must see provider for future refills 90 tablet 0   Rivaroxaban (XARELTO) 15 MG TABS tablet Take 1 tablet (15 mg total) by mouth daily with supper. NDC # [id number] Lot #  [id number] Exp- 09/16/23 21 tablet 0   spironolactone (ALDACTONE) 25 MG tablet Take 1 tablet (25 mg total) by mouth daily. 30 tablet 11   sucralfate (CARAFATE) 1 GM/10ML suspension Take 10 mLs (1 g total) by mouth 4 (four) times daily -  with meals and  at bedtime. (Patient taking differently: Take 1 g by mouth daily as needed (indigestion).) 420 mL 0   torsemide (DEMADEX) 20 MG tablet Take 1.5 tablets (30 mg total) by mouth daily. 90 tablet 2   TRELEGY ELLIPTA 100-62.5-25 MCG/ACT AEPB Inhale ONE PUFF into THE lungs daily (Patient not taking: Reported on 12/02/2022) 120 each 1   No current facility-administered medications for this visit.     Past Medical History:  Diagnosis Date   (HFimpEF) heart failure with improved ejection fraction (HCC)    a. 07/2018 Echo: EF 30-35%; b. 11/2019 Echo: EF 55-60%, no rwma, Gr2 DD, Nl RV size/fxn. Mild BAE. Mild MR/AI.   Arthritis    Asthma    Atopic dermatitis    CAD (coronary artery disease)    a. 2016 s/p CABG x 2 (LIMA->LAD, VG->OM); b. 08/2018 MV: EF 44%, no  ischemia/infact.   Cardiac arrest 10/2014   Cardiomyopathy, ischemic    a. 07/2018 Echo: EF 30-35%; 11/2019 Echo: EF 55-60%.   Carotid arterial disease    a. 11/2019 Carotid U/S   CHF (congestive heart failure)    CKD (chronic kidney disease), stage III    COPD (chronic obstructive pulmonary disease)    Diabetes mellitus without complication    Dysrhythmia    Esophageal dilatation 2013   GERD (gastroesophageal reflux disease)    Glaucoma    Gout    Headache    Hypertension    Hypokalemia    Idiopathic angioedema    LGI bleed 08/06/2017   a. felt to be hemorrhoidal during that admission (no drop in Hgb).   Lower back pain    Paroxysmal atrial fibrillation    a. Dx 2016-->h/o difficult to control rates (complicated by noncompliance), not felt to be a candidate for ablation or antiarrhythmic due to noncompliance; b. Recurrent AF 2021 - converted w/ IV dilt; c. CHA2DS2VASc = 7-->Xarelto.   Personal history of noncompliance with medical treatment, presenting hazards to health    Prediabetes    Presence of permanent cardiac pacemaker    S/P CABG x 2 with clipping of LA appendage 11/14/2014   LIMA to LAD, SVG to OM, EVH via right thigh   Urine incontinence    Uterine fibroid     ROS:   All systems reviewed and negative except as noted in the HPI.   Past Surgical History:  Procedure Laterality Date   AV NODE ABLATION N/A 01/18/2021   Procedure: AV NODE ABLATION;  Surgeon: [name], MD;  Location: [facility] INVASIVE CV LAB;  Service: Cardiovascular;  Laterality: N/A;   BALLOON DILATION N/A 10/10/2019   Procedure: BALLOON DILATION;  Surgeon: Kathi Der, MD;  Location: [facility] ENDOSCOPY;  Service: Gastroenterology;  Laterality: N/A;   BIOPSY  10/10/2019   Procedure: BIOPSY;  Surgeon: Kathi Der, MD;  Location: [facility] ENDOSCOPY;  Service: Gastroenterology;;   BIOPSY  10/11/2019   Procedure: BIOPSY;  Surgeon: Kathi Der, MD;  Location: [facility] ENDOSCOPY;  Service:  Gastroenterology;;   Earnstine Regal N/A 11/21/2022   Procedure: BIVI  ICD UPGRADE;  Surgeon: [name], MD;  Location: [facility] INVASIVE CV LAB;  Service: Cardiovascular;  Laterality: N/A;   CARDIOVERSION N/A 11/18/2014   Procedure: CARDIOVERSION;  Surgeon: Chrystie Nose, MD;  Location: [facility] OR;  Service: Cardiovascular;  Laterality: N/A;   CARDIOVERSION N/A 12/25/2017   Procedure: CARDIOVERSION;  Surgeon: Dolores Patty, MD;  Location: [facility] ENDOSCOPY;  Service: Cardiovascular;  Laterality: N/A;   CLIPPING OF ATRIAL APPENDAGE N/A 11/14/2014  Procedure: CLIPPING OF ATRIAL APPENDAGE;  Surgeon: Purcell Nails, MD;  Location: [facility] OR;  Service: Open Heart Surgery;  Laterality: N/A;   COLONOSCOPY  2013   COLONOSCOPY WITH PROPOFOL N/A 10/11/2019   Procedure: COLONOSCOPY WITH PROPOFOL;  Surgeon: Kathi Der, MD;  Location: [facility] ENDOSCOPY;  Service: Gastroenterology;  Laterality: N/A;   CORONARY ARTERY BYPASS GRAFT N/A 11/14/2014   Procedure: CORONARY ARTERY BYPASS GRAFTING (CABG)TIMES 2 USING LEFT INTERNAL MAMMARY ARTERY AND RIGHT SAPHENOUS VEIN HARVESTED ENDOSCOPICALLY;  Surgeon: Purcell Nails, MD;  Location: [facility] OR;  Service: Open Heart Surgery;  Laterality: N/A;   ESOPHAGOGASTRODUODENOSCOPY (EGD) WITH PROPOFOL N/A 10/10/2019   Procedure: ESOPHAGOGASTRODUODENOSCOPY (EGD) WITH PROPOFOL;  Surgeon: Kathi Der, MD;  Location: [facility] ENDOSCOPY;  Service: Gastroenterology;  Laterality: N/A;   ESOPHAGOGASTRODUODENOSCOPY (EGD) WITH PROPOFOL N/A 10/27/2019   Procedure: ESOPHAGOGASTRODUODENOSCOPY (EGD) WITH PROPOFOL;  Surgeon: Kerin Salen, MD;  Location: [facility] ENDOSCOPY;  Service: Gastroenterology;  Laterality: N/A;   GIVENS CAPSULE STUDY N/A 10/27/2019   Procedure: GIVENS CAPSULE STUDY;  Surgeon: Kerin Salen, MD;  Location: [facility] ENDOSCOPY;  Service: Gastroenterology;  Laterality: N/A;   INSERT / REPLACE / REMOVE PACEMAKER     LEFT HEART CATHETERIZATION WITH CORONARY ANGIOGRAM N/A 11/04/2014   Procedure:  LEFT HEART CATHETERIZATION WITH CORONARY ANGIOGRAM;  Surgeon: Lennette Bihari, MD;  Location: [facility] CATH LAB;  Service: Cardiovascular;  Laterality: N/A;   PACEMAKER IMPLANT N/A 01/18/2021   Procedure: PACEMAKER IMPLANT;  Surgeon: [name], MD;  Location: [facility]VASIVE CV LAB;  Service: Cardiovascular;  Laterality: N/A;   PACEMAKER IMPLANT Left    POLYPECTOMY  10/11/2019   Procedure: POLYPECTOMY;  Surgeon: Kathi Der, MD;  Location: [facility] ENDOSCOPY;  Service: Gastroenterology;;   RIGHT HEART CATH N/A 06/08/2022   Procedure: RIGHT HEART CATH;  Surgeon: Dolores Patty, MD;  Location: [facility] INVASIVE CV LAB;  Service: Cardiovascular;  Laterality: N/A;   TEE WITHOUT CARDIOVERSION N/A 11/14/2014   Procedure: TRANSESOPHAGEAL ECHOCARDIOGRAM (TEE);  Surgeon: Purcell Nails, MD;  Location[facility] OR;  Service: Open Heart Surgery;  Laterality: N/A;   TEE WITHOUT CARDIOVERSION N/A 12/25/2017   Procedure: TRANSESOPHAGEAL ECHOCARDIOGRAM (TEE);  Surgeon: Dolores Patty, MD;  Location: [facility] ENDOSCOPY;  Service: Cardiovascular;  Laterality: N/A;   TEMPORARY PACEMAKER INSERTION  11/04/2014   Procedure: TEMPORARY PACEMAKER INSERTION;  Surgeon: Lennette Bihari, MD;  Location: [facility] CATH LAB;  Service: Cardiovascular;;   TOOTH EXTRACTION       Family History  Problem Relation Age of Onset   Cancer Mother        LYMPHOMA   Heart disease Father    Other Father        TB   CVA Sister    Prostate cancer Brother 67   Diabetes Brother      Social History   Socioeconomic History   Marital status: Widowed    Spouse name: Not on file   Number of children: 1   Years of education: 63   Highest education level: Not on file  Occupational History   Occupation: RETIRED [employer]  Tobacco Use   Smoking status: Former    Packs/day: 0.50    Years: 56.00    Additional pack years: 0.00    Total pack years: 28.00    Types: Cigarettes    Quit date: 11/13/2021    Years since quitting: 1.0    Smokeless tobacco: Never   Tobacco comments:    smoking 2 cigarettes/day as of 10/23/20  Vaping Use  Vaping Use: Never used  Substance and Sexual Activity   Alcohol use: No    Alcohol/week: 0.0 standard drinks of alcohol   Drug use: No   Sexual activity: Not on file  Other Topics Concern   Not on file  Social History Narrative   Patient reports it being difficult to pay for everything due to outstanding medical bills from hospital stay last year but states she is able to keep up with basic expenses.  Patient does have some concerns with her house's condition due to a water leak- had her roof repaired last month but has some leaking in the front which has affected her porch.  Patient owns her own car and is able to drive herself but has some concerns about the reliability of her car- has gotten taxi to the clinic for appointment in the past when her car broke down.   Social Determinants of Health   Financial Resource Strain: Low Risk  (06/07/2018)   Overall Financial Resource Strain (CARDIA)    Difficulty of Paying Living Expenses: Not hard at all  Recent Concern: Financial Resource Strain - Medium Risk (05/21/2018)   Overall Financial Resource Strain (CARDIA)    Difficulty of Paying Living Expenses: Somewhat hard  Food Insecurity: No Food Insecurity (06/09/2022)   Hunger Vital Sign    Worried About Running Out of Food in the Last Year: Never true    Ran Out of Food in the Last Year: Never true  Transportation Needs: No Transportation Needs (06/09/2022)   PRAPARE - Administrator, Civil Service (Medical): No    Lack of Transportation (Non-Medical): No  Physical Activity: Not on file  Stress: Not on file  Social Connections: Not on file  Intimate Partner Violence: Not At Risk (06/09/2022)   Humiliation, Afraid, Rape, and Kick questionnaire    Fear of Current or Ex-Partner: No    Emotionally Abused: No    Physically Abused: No    Sexually Abused: No     BP (!) 150/88  (BP Location: Left Arm)   Pulse 86   Resp 20   Wt 184 lb (83.5 kg)   SpO2 97%   BMI 28.39 kg/m   Physical Exam:  Well appearing NAD HEENT: Unremarkable Neck:  No JVD, no thyromegally Lymphatics:  No adenopathy Back:  No CVA tenderness Lungs:  Clear HEART:  Regular rate rhythm, no murmurs, no rubs, no clicks Abd:  soft, positive bowel sounds, no organomegally, no rebound, no guarding Ext:  2 plus pulses, no edema, no cyanosis, no clubbing Skin:  No rashes no nodules Neuro:  CN II through XII intact, motor grossly intact  EKG - atrial fib with a slow VR  DEVICE  Normal device function.  See PaceArt for details.   Assess/Plan: Chronic systolic heart failure - her symptoms are class 3A. I asked her to increase torsemide to 30 mg daily.  Atrial fib- her rates are slow and today I turned her to VVIR 60/min. To give some additional rate response. PPM - she has an occluded left subclavian vein. I offered her lead extraction and insertion of a biv ICD with removal of her pacing lead and insertion of a CS lead, left bundle area lead, and insertion of an ICD lead. She is not interested in this now. She might reconsider. HTN -her bp is up. Hopefully an increase in the diuretic will help.  Omer Jack atrial fib  Occluded left subclavian vein.

## 2022-12-08 NOTE — Patient Instructions (Addendum)
Medication Instructions:  Your physician has recommended you make the following change in your medication: MEDICATION INCREASE:  Torsemide to 30 mg, ( 1 and 1/2 tablets ) by mouth daily.    You will-  Take 1.5 tablets (30 mg total) by mouth daily.    Lab Work: [name] [name] scheduled today.  Pt will have BMET drawn the week of Dec 19, 2022.   If you have labs (blood work) drawn today and your tests are completely normal, you will receive your results only by: MyChart Message (if you have MyChart) OR A paper copy in the mail If you have any lab test that is abnormal or we need to change your treatment, we will call you to review the results.  Testing/Procedures: None ordered.  Follow-Up: Dr. Lewayne Bunting has ordered a BMET to be drawn prior to her 1 month follow up visit.    The format for your next appointment:   Please schedule a 1 month follow up appointment with Dr. Lewayne Bunting.  Pt should have BMET prior to this appointment.   Provider:   Lewayne Bunting, MD{or one of the following Advanced Practice Providers on your designated Care Team:   Francis Dowse, [place] Casimiro Needle "Mardelle Matte" Lanna Poche, [place]  Remote monitoring is used to monitor your Pacemaker from home. This monitoring reduces the number of office visits required to check your device to one time per year. It allows [place] to keep an eye on the functioning of your device to ensure it is working properly. You are scheduled for a device check from home on 01/17/23. You may send your transmission at any time that day. If you have a wireless device, the transmission will be sent automatically. After your physician reviews your transmission, you will receive a postcard with your next transmission date.

## 2022-12-09 ENCOUNTER — Telehealth (HOSPITAL_COMMUNITY): Payer: Self-pay | Admitting: Emergency Medicine

## 2022-12-09 NOTE — Telephone Encounter (Signed)
Called to remind [name] [name] of home visit today and she asked to do it next week instead.  Rescheduled 12/14/22 @ 2:15.    [name], EMT-Paramedic [phone] 12/09/2022

## 2022-12-12 ENCOUNTER — Encounter: Payer: Self-pay | Admitting: Internal Medicine

## 2022-12-12 ENCOUNTER — Inpatient Hospital Stay (HOSPITAL_COMMUNITY)
Admission: EM | Admit: 2022-12-12 | Discharge: 2022-12-14 | DRG: 291 | Disposition: A | Discharge status: Home / Self Care | Payer: Medicare Other | Attending: Internal Medicine | Admitting: Internal Medicine

## 2022-12-12 ENCOUNTER — Other Ambulatory Visit: Payer: Self-pay

## 2022-12-12 ENCOUNTER — Encounter (HOSPITAL_COMMUNITY): Payer: Self-pay

## 2022-12-12 ENCOUNTER — Ambulatory Visit (INDEPENDENT_AMBULATORY_CARE_PROVIDER_SITE_OTHER): Payer: Medicare Other

## 2022-12-12 ENCOUNTER — Ambulatory Visit (INDEPENDENT_AMBULATORY_CARE_PROVIDER_SITE_OTHER): Payer: Medicare Other | Admitting: Internal Medicine

## 2022-12-12 VITALS — BP 148/96 | HR 60 | Temp 98.7°F | Ht 67.5 in | Wt 183.0 lb

## 2022-12-12 DIAGNOSIS — M109 Gout, unspecified: Secondary | ICD-10-CM | POA: Diagnosis not present

## 2022-12-12 DIAGNOSIS — E1122 Type 2 diabetes mellitus with diabetic chronic kidney disease: Secondary | ICD-10-CM | POA: Diagnosis not present

## 2022-12-12 DIAGNOSIS — Z91199 Patient's noncompliance with other medical treatment and regimen due to unspecified reason: Secondary | ICD-10-CM

## 2022-12-12 DIAGNOSIS — Z91013 Allergy to seafood: Secondary | ICD-10-CM

## 2022-12-12 DIAGNOSIS — G8929 Other chronic pain: Secondary | ICD-10-CM | POA: Diagnosis present

## 2022-12-12 DIAGNOSIS — I4821 Permanent atrial fibrillation: Secondary | ICD-10-CM | POA: Diagnosis not present

## 2022-12-12 DIAGNOSIS — J449 Chronic obstructive pulmonary disease, unspecified: Secondary | ICD-10-CM | POA: Diagnosis present

## 2022-12-12 DIAGNOSIS — Z91041 Radiographic dye allergy status: Secondary | ICD-10-CM

## 2022-12-12 DIAGNOSIS — J4489 Other specified chronic obstructive pulmonary disease: Secondary | ICD-10-CM | POA: Diagnosis not present

## 2022-12-12 DIAGNOSIS — I428 Other cardiomyopathies: Secondary | ICD-10-CM | POA: Diagnosis present

## 2022-12-12 DIAGNOSIS — Z885 Allergy status to narcotic agent status: Secondary | ICD-10-CM

## 2022-12-12 DIAGNOSIS — E119 Type 2 diabetes mellitus without complications: Secondary | ICD-10-CM

## 2022-12-12 DIAGNOSIS — Z1152 Encounter for screening for COVID-19: Secondary | ICD-10-CM | POA: Diagnosis not present

## 2022-12-12 DIAGNOSIS — Z7189 Other specified counseling: Secondary | ICD-10-CM | POA: Diagnosis not present

## 2022-12-12 DIAGNOSIS — H409 Unspecified glaucoma: Secondary | ICD-10-CM | POA: Diagnosis present

## 2022-12-12 DIAGNOSIS — E1169 Type 2 diabetes mellitus with other specified complication: Secondary | ICD-10-CM | POA: Diagnosis not present

## 2022-12-12 DIAGNOSIS — I160 Hypertensive urgency: Secondary | ICD-10-CM | POA: Diagnosis present

## 2022-12-12 DIAGNOSIS — I2489 Other forms of acute ischemic heart disease: Secondary | ICD-10-CM | POA: Diagnosis not present

## 2022-12-12 DIAGNOSIS — K219 Gastro-esophageal reflux disease without esophagitis: Secondary | ICD-10-CM | POA: Diagnosis present

## 2022-12-12 DIAGNOSIS — I5023 Acute on chronic systolic (congestive) heart failure: Secondary | ICD-10-CM | POA: Diagnosis not present

## 2022-12-12 DIAGNOSIS — I130 Hypertensive heart and chronic kidney disease with heart failure and stage 1 through stage 4 chronic kidney disease, or unspecified chronic kidney disease: Principal | ICD-10-CM | POA: Diagnosis present

## 2022-12-12 DIAGNOSIS — I2583 Coronary atherosclerosis due to lipid rich plaque: Secondary | ICD-10-CM | POA: Diagnosis present

## 2022-12-12 DIAGNOSIS — N1832 Chronic kidney disease, stage 3b: Secondary | ICD-10-CM | POA: Diagnosis not present

## 2022-12-12 DIAGNOSIS — I493 Ventricular premature depolarization: Secondary | ICD-10-CM | POA: Diagnosis present

## 2022-12-12 DIAGNOSIS — I255 Ischemic cardiomyopathy: Secondary | ICD-10-CM | POA: Diagnosis present

## 2022-12-12 DIAGNOSIS — E1140 Type 2 diabetes mellitus with diabetic neuropathy, unspecified: Secondary | ICD-10-CM | POA: Diagnosis not present

## 2022-12-12 DIAGNOSIS — R059 Cough, unspecified: Secondary | ICD-10-CM | POA: Diagnosis not present

## 2022-12-12 DIAGNOSIS — I5043 Acute on chronic combined systolic (congestive) and diastolic (congestive) heart failure: Secondary | ICD-10-CM | POA: Diagnosis not present

## 2022-12-12 DIAGNOSIS — Z8042 Family history of malignant neoplasm of prostate: Secondary | ICD-10-CM

## 2022-12-12 DIAGNOSIS — D696 Thrombocytopenia, unspecified: Secondary | ICD-10-CM | POA: Diagnosis present

## 2022-12-12 DIAGNOSIS — N1831 Chronic kidney disease, stage 3a: Secondary | ICD-10-CM | POA: Diagnosis not present

## 2022-12-12 DIAGNOSIS — R051 Acute cough: Secondary | ICD-10-CM | POA: Insufficient documentation

## 2022-12-12 DIAGNOSIS — I2510 Atherosclerotic heart disease of native coronary artery without angina pectoris: Secondary | ICD-10-CM | POA: Diagnosis present

## 2022-12-12 DIAGNOSIS — Z91148 Patient's other noncompliance with medication regimen for other reason: Secondary | ICD-10-CM

## 2022-12-12 DIAGNOSIS — N183 Chronic kidney disease, stage 3 unspecified: Secondary | ICD-10-CM | POA: Diagnosis present

## 2022-12-12 DIAGNOSIS — Z888 Allergy status to other drugs, medicaments and biological substances status: Secondary | ICD-10-CM

## 2022-12-12 DIAGNOSIS — I5042 Chronic combined systolic (congestive) and diastolic (congestive) heart failure: Secondary | ICD-10-CM

## 2022-12-12 DIAGNOSIS — F17210 Nicotine dependence, cigarettes, uncomplicated: Secondary | ICD-10-CM | POA: Diagnosis not present

## 2022-12-12 DIAGNOSIS — Z91030 Bee allergy status: Secondary | ICD-10-CM

## 2022-12-12 DIAGNOSIS — Z5986 Financial insecurity: Secondary | ICD-10-CM

## 2022-12-12 DIAGNOSIS — I4819 Other persistent atrial fibrillation: Secondary | ICD-10-CM | POA: Diagnosis present

## 2022-12-12 DIAGNOSIS — Z7984 Long term (current) use of oral hypoglycemic drugs: Secondary | ICD-10-CM

## 2022-12-12 DIAGNOSIS — Z8249 Family history of ischemic heart disease and other diseases of the circulatory system: Secondary | ICD-10-CM

## 2022-12-12 DIAGNOSIS — R0602 Shortness of breath: Secondary | ICD-10-CM | POA: Diagnosis not present

## 2022-12-12 DIAGNOSIS — Z79899 Other long term (current) drug therapy: Secondary | ICD-10-CM

## 2022-12-12 DIAGNOSIS — Z7951 Long term (current) use of inhaled steroids: Secondary | ICD-10-CM

## 2022-12-12 DIAGNOSIS — Z880 Allergy status to penicillin: Secondary | ICD-10-CM | POA: Diagnosis not present

## 2022-12-12 DIAGNOSIS — I340 Nonrheumatic mitral (valve) insufficiency: Secondary | ICD-10-CM | POA: Diagnosis not present

## 2022-12-12 DIAGNOSIS — Z833 Family history of diabetes mellitus: Secondary | ICD-10-CM

## 2022-12-12 DIAGNOSIS — E785 Hyperlipidemia, unspecified: Secondary | ICD-10-CM | POA: Diagnosis not present

## 2022-12-12 DIAGNOSIS — Z8674 Personal history of sudden cardiac arrest: Secondary | ICD-10-CM

## 2022-12-12 DIAGNOSIS — I509 Heart failure, unspecified: Principal | ICD-10-CM

## 2022-12-12 DIAGNOSIS — I110 Hypertensive heart disease with heart failure: Secondary | ICD-10-CM | POA: Diagnosis not present

## 2022-12-12 DIAGNOSIS — Z515 Encounter for palliative care: Secondary | ICD-10-CM | POA: Diagnosis not present

## 2022-12-12 DIAGNOSIS — I5021 Acute systolic (congestive) heart failure: Secondary | ICD-10-CM | POA: Diagnosis not present

## 2022-12-12 DIAGNOSIS — Z807 Family history of other malignant neoplasms of lymphoid, hematopoietic and related tissues: Secondary | ICD-10-CM

## 2022-12-12 DIAGNOSIS — Z951 Presence of aortocoronary bypass graft: Secondary | ICD-10-CM

## 2022-12-12 DIAGNOSIS — R7989 Other specified abnormal findings of blood chemistry: Secondary | ICD-10-CM | POA: Diagnosis present

## 2022-12-12 DIAGNOSIS — Z7901 Long term (current) use of anticoagulants: Secondary | ICD-10-CM

## 2022-12-12 DIAGNOSIS — Z823 Family history of stroke: Secondary | ICD-10-CM

## 2022-12-12 DIAGNOSIS — Z950 Presence of cardiac pacemaker: Secondary | ICD-10-CM

## 2022-12-12 LAB — CBC WITH DIFFERENTIAL/PLATELET
Abs Immature Granulocytes: 0.01 10*3/uL (ref 0.00–0.07)
Basophils Absolute: 0.1 10*3/uL (ref 0.0–0.1)
Basophils Absolute: 0.1 10*3/uL (ref 0.0–0.1)
Basophils Relative: 1.1 % (ref 0.0–3.0)
Basophils Relative: 1 %
Eosinophils Absolute: 0 10*3/uL (ref 0.0–0.7)
Eosinophils Absolute: 0 10*3/uL (ref 0.0–0.5)
Eosinophils Relative: 0.4 % (ref 0.0–5.0)
Eosinophils Relative: 1 %
HCT: 46.2 % — ABNORMAL HIGH (ref 36.0–46.0)
HCT: 47.5 % — ABNORMAL HIGH (ref 36.0–46.0)
Hemoglobin: 15.3 g/dL — ABNORMAL HIGH (ref 12.0–15.0)
Hemoglobin: 15.5 g/dL — ABNORMAL HIGH (ref 12.0–15.0)
Immature Granulocytes: 0 %
Lymphocytes Relative: 16 % (ref 12.0–46.0)
Lymphocytes Relative: 16 %
Lymphs Abs: 1 10*3/uL (ref 0.7–4.0)
Lymphs Abs: 1 10*3/uL (ref 0.7–4.0)
MCH: 29.6 pg (ref 26.0–34.0)
MCHC: 33.1 g/dL (ref 30.0–36.0)
MCHC: 32.6 g/dL (ref 30.0–36.0)
MCV: 90.4 fl (ref 78.0–100.0)
MCV: 90.8 fL (ref 80.0–100.0)
Monocytes Absolute: 0.7 10*3/uL (ref 0.1–1.0)
Monocytes Absolute: 0.7 10*3/uL (ref 0.1–1.0)
Monocytes Relative: 10.9 % (ref 3.0–12.0)
Monocytes Relative: 11 %
Neutro Abs: 4.5 10*3/uL (ref 1.4–7.7)
Neutro Abs: 4.5 10*3/uL (ref 1.7–7.7)
Neutrophils Relative %: 71.6 % (ref 43.0–77.0)
Neutrophils Relative %: 71 %
Platelets: 107 10*3/uL — ABNORMAL LOW (ref 150.0–400.0)
Platelets: 119 10*3/uL — ABNORMAL LOW (ref 150–400)
RBC: 5.11 Mil/uL (ref 3.87–5.11)
RBC: 5.23 MIL/uL — ABNORMAL HIGH (ref 3.87–5.11)
RDW: 18.1 % — ABNORMAL HIGH (ref 11.5–15.5)
RDW: 18.1 % — ABNORMAL HIGH (ref 11.5–15.5)
WBC: 6.2 10*3/uL (ref 4.0–10.5)
WBC: 6.3 10*3/uL (ref 4.0–10.5)
nRBC: 0 % (ref 0.0–0.2)

## 2022-12-12 LAB — TROPONIN I (HIGH SENSITIVITY)
High Sens Troponin I: 40 ng/L (ref 2–17)
Troponin I (High Sensitivity): 42 ng/L — ABNORMAL HIGH (ref <18)
Troponin I (High Sensitivity): 43 ng/L — ABNORMAL HIGH (ref <18)

## 2022-12-12 LAB — BASIC METABOLIC PANEL
Anion gap: 11 (ref 5–15)
BUN: 19 mg/dL (ref 8–23)
CO2: 18 mmol/L — ABNORMAL LOW (ref 22–32)
Calcium: 9.2 mg/dL (ref 8.9–10.3)
Chloride: 112 mmol/L — ABNORMAL HIGH (ref 98–111)
Creatinine, Ser: 1.3 mg/dL — ABNORMAL HIGH (ref 0.44–1.00)
GFR, Estimated: 42 mL/min — ABNORMAL LOW (ref >60)
Glucose, Bld: 126 mg/dL — ABNORMAL HIGH (ref 70–99)
Potassium: 3.8 mmol/L (ref 3.5–5.1)
Sodium: 141 mmol/L (ref 135–145)

## 2022-12-12 LAB — GLUCOSE, CAPILLARY: Glucose-Capillary: 127 mg/dL — ABNORMAL HIGH (ref 70–99)

## 2022-12-12 LAB — BRAIN NATRIURETIC PEPTIDE
B Natriuretic Peptide: 777.9 pg/mL — ABNORMAL HIGH (ref 0.0–100.0)
Pro B Natriuretic peptide (BNP): 764 pg/mL — ABNORMAL HIGH (ref 0.0–100.0)

## 2022-12-12 LAB — D-DIMER, QUANTITATIVE: D-Dimer, Quant: 0.43 mcg/mL FEU (ref <0.50)

## 2022-12-12 LAB — HEMOGLOBIN A1C: Hgb A1c MFr Bld: 6.3 % (ref 4.6–6.5)

## 2022-12-12 LAB — POC COVID19 BINAXNOW: SARS Coronavirus 2 Ag: NEGATIVE

## 2022-12-12 MED ORDER — ALLOPURINOL 100 MG PO TABS
100.0000 mg | ORAL_TABLET | Freq: Every day | ORAL | Status: DC
  Administered 2022-12-13 – 2022-12-14 (×2): 100 mg via ORAL
  Filled 2022-12-12 (×3): qty 1

## 2022-12-12 MED ORDER — FLUTICASONE FUROATE-VILANTEROL 100-25 MCG/ACT IN AEPB
1.0000 | INHALATION_SPRAY | Freq: Every day | RESPIRATORY_TRACT | Status: DC
  Administered 2022-12-14: 1 via RESPIRATORY_TRACT
  Filled 2022-12-12 (×3): qty 28

## 2022-12-12 MED ORDER — INSULIN ASPART 100 UNIT/ML IJ SOLN: 0.0000 [IU] | Freq: Three times a day (TID) | INTRAMUSCULAR | Status: DC

## 2022-12-12 MED ORDER — ACETAMINOPHEN 325 MG PO TABS
650.0000 mg | ORAL_TABLET | Freq: Four times a day (QID) | ORAL | Status: DC | PRN
  Administered 2022-12-13 – 2022-12-14 (×2): 650 mg via ORAL
  Filled 2022-12-12 (×2): qty 2

## 2022-12-12 MED ORDER — HYDRALAZINE HCL 20 MG/ML IJ SOLN: 5.0000 mg | Freq: Four times a day (QID) | INTRAMUSCULAR | Status: DC | PRN

## 2022-12-12 MED ORDER — HYDRALAZINE HCL 25 MG PO TABS
25.0000 mg | ORAL_TABLET | Freq: Three times a day (TID) | ORAL | Status: DC
  Administered 2022-12-13 (×2): 25 mg via ORAL
  Filled 2022-12-12 (×3): qty 1

## 2022-12-12 MED ORDER — GABAPENTIN 100 MG PO CAPS
100.0000 mg | ORAL_CAPSULE | Freq: Every day | ORAL | Status: DC
  Administered 2022-12-13 (×2): 100 mg via ORAL
  Filled 2022-12-12 (×2): qty 1

## 2022-12-12 MED ORDER — INSULIN ASPART 100 UNIT/ML IJ SOLN: 0.0000 [IU] | Freq: Every day | INTRAMUSCULAR | Status: DC

## 2022-12-12 MED ORDER — FUROSEMIDE 10 MG/ML IJ SOLN
80.0000 mg | Freq: Two times a day (BID) | INTRAMUSCULAR | Status: DC
  Administered 2022-12-13 – 2022-12-14 (×3): 80 mg via INTRAVENOUS
  Filled 2022-12-12 (×3): qty 8

## 2022-12-12 MED ORDER — FUROSEMIDE 10 MG/ML IJ SOLN
80.0000 mg | Freq: Once | INTRAMUSCULAR | Status: AC
  Administered 2022-12-12: 80 mg via INTRAVENOUS
  Filled 2022-12-12: qty 8

## 2022-12-12 MED ORDER — UMECLIDINIUM BROMIDE 62.5 MCG/ACT IN AEPB
1.0000 | INHALATION_SPRAY | Freq: Every day | RESPIRATORY_TRACT | Status: DC
  Administered 2022-12-14: 1 via RESPIRATORY_TRACT
  Filled 2022-12-12 (×2): qty 7

## 2022-12-12 MED ORDER — SPIRONOLACTONE 25 MG PO TABS
25.0000 mg | ORAL_TABLET | Freq: Every day | ORAL | Status: DC
  Administered 2022-12-13 – 2022-12-14 (×2): 25 mg via ORAL
  Filled 2022-12-12 (×3): qty 1

## 2022-12-12 MED ORDER — RIVAROXABAN 15 MG PO TABS
15.0000 mg | ORAL_TABLET | Freq: Every day | ORAL | Status: DC
  Administered 2022-12-13: 15 mg via ORAL
  Filled 2022-12-12: qty 1

## 2022-12-12 MED ORDER — LOSARTAN POTASSIUM 50 MG PO TABS
50.0000 mg | ORAL_TABLET | Freq: Every day | ORAL | Status: DC
  Administered 2022-12-13 – 2022-12-14 (×2): 50 mg via ORAL
  Filled 2022-12-12 (×2): qty 1

## 2022-12-12 MED ORDER — ACETAMINOPHEN 650 MG RE SUPP: 650.0000 mg | Freq: Four times a day (QID) | RECTAL | Status: DC | PRN

## 2022-12-12 MED ORDER — ALBUTEROL SULFATE (2.5 MG/3ML) 0.083% IN NEBU: 2.5000 mg | INHALATION_SOLUTION | Freq: Four times a day (QID) | RESPIRATORY_TRACT | Status: DC | PRN

## 2022-12-12 MED ORDER — ATORVASTATIN CALCIUM 10 MG PO TABS
10.0000 mg | ORAL_TABLET | Freq: Every day | ORAL | Status: DC
  Administered 2022-12-13 (×2): 10 mg via ORAL
  Filled 2022-12-12 (×2): qty 1

## 2022-12-12 MED ORDER — PANTOPRAZOLE SODIUM 40 MG PO TBEC
40.0000 mg | DELAYED_RELEASE_TABLET | Freq: Every day | ORAL | Status: DC
  Administered 2022-12-13 – 2022-12-14 (×2): 40 mg via ORAL
  Filled 2022-12-12 (×2): qty 1

## 2022-12-12 NOTE — Progress Notes (Unsigned)
Subjective:  Patient ID: [name] [name], female    DOB: Dec 17, 1945  Age: 77 y.o. MRN: [id number]  CC: Atrial Fibrillation and Cough   HPI KARL KNARR presents for f/up -  She complains of a 2 day history of ST, cough productive of tan phlegm, SOB, and LE edema.  No facility-administered medications prior to visit.   Outpatient Medications Prior to Visit  Medication Sig Dispense Refill   acetaminophen (TYLENOL) 325 MG tablet Take 2 tablets (650 mg total) by mouth every 4 (four) hours as needed for headache or mild pain.     acetaminophen (TYLENOL) 500 MG tablet Take 500 mg by mouth every 6 (six) hours as needed for moderate pain.     albuterol (VENTOLIN HFA) 108 (90 Base) MCG/ACT inhaler Inhale 1-2 puffs into the lungs every 6 (six) hours as needed for wheezing or shortness of breath. (Patient not taking: Reported on 12/12/2022) 18 g 2   allopurinol (ZYLOPRIM) 100 MG tablet Take 100 mg by mouth daily.     atorvastatin (LIPITOR) 10 MG tablet Take 1 tablet (10 mg total) by mouth at bedtime. 90 tablet 1   gabapentin (NEURONTIN) 100 MG capsule Take 1 capsule (100 mg total) by mouth at bedtime. 90 capsule 0   hydrALAZINE (APRESOLINE) 25 MG tablet Take 1 tablet (25 mg total) by mouth 3 (three) times daily. (Patient taking differently: Take 25-50 mg by mouth See admin instructions. Take 2 tablets in the morning and 1 tablet at night) 270 tablet 1   isosorbide mononitrate (IMDUR) 30 MG 24 hr tablet Take 1 tablet (30 mg total) by mouth daily. (Patient not taking: Reported on 12/12/2022) 30 tablet 11   losartan (COZAAR) 50 MG tablet Take 1 tablet (50 mg total) by mouth daily. 30 tablet 6   metFORMIN (GLUCOPHAGE) 500 MG tablet TAKE 1 TABLET (500 MG TOTAL) BY MOUTH DAILY WITH BREAKFAST (MEAL) (Patient taking differently: Take 500 mg by mouth daily with breakfast.) 90 tablet 0   Naphazoline HCl (CLEAR EYES OP) Place 1 drop into both eyes daily as needed (irritation).     pantoprazole (PROTONIX) 40 MG  tablet Take 1 tablet (40 mg total) by mouth daily. Follow-up appt due in Marcht must see provider for future refills 90 tablet 0   Rivaroxaban (XARELTO) 15 MG TABS tablet Take 1 tablet (15 mg total) by mouth daily with supper. NDC # [id number] Lot #  [id number] Exp- 09/16/23 21 tablet 0   spironolactone (ALDACTONE) 25 MG tablet Take 1 tablet (25 mg total) by mouth daily. 30 tablet 11   sucralfate (CARAFATE) 1 GM/10ML suspension Take 10 mLs (1 g total) by mouth 4 (four) times daily -  with meals and at bedtime. (Patient not taking: Reported on 12/12/2022) 420 mL 0   torsemide (DEMADEX) 20 MG tablet Take 1.5 tablets (30 mg total) by mouth daily. (Patient taking differently: Take 20 mg by mouth daily.) 90 tablet 2   TRELEGY ELLIPTA 100-62.5-25 MCG/ACT AEPB Inhale ONE PUFF into THE lungs daily (Patient taking differently: Inhale 1 puff into the lungs daily as needed (for shortness of breath).) 120 each 1    ROS Review of Systems  Constitutional: Negative.  Negative for appetite change, diaphoresis, fatigue, fever and unexpected weight change.  HENT:  Positive for sore throat.   Eyes: Negative.   Respiratory:  Positive for cough and shortness of breath. Negative for chest tightness and wheezing.   Cardiovascular:  Positive for palpitations and leg swelling. Negative for chest  pain.  Gastrointestinal:  Negative for abdominal pain, diarrhea and nausea.  Endocrine: Negative.   Genitourinary: Negative.   Musculoskeletal:  Positive for gait problem.  Skin: Negative.   Neurological:  Negative for dizziness, weakness and headaches.  Hematological: Negative.   Psychiatric/Behavioral: Negative.      Objective:  BP (!) 148/96 (BP Location: Right Arm, Patient Position: Sitting, Cuff Size: Large)   Pulse 60   Temp 98.7 F (37.1 C) (Oral)   Ht 5' 7.5" (1.715 m)   Wt 183 lb (83 kg)   SpO2 96%   BMI 28.24 kg/m   BP Readings from Last 3 Encounters:  12/13/22 (!) 141/116  12/12/22 (!) 148/96   12/08/22 (!) 150/88    Wt Readings from Last 3 Encounters:  12/13/22 179 lb (81.2 kg)  12/12/22 183 lb (83 kg)  12/08/22 184 lb (83.5 kg)    Physical Exam Vitals reviewed.  Constitutional:      Appearance: Normal appearance. She is not ill-appearing.  HENT:     Mouth/Throat:     Mouth: Mucous membranes are moist.     Pharynx: Oropharynx is clear.     Tonsils: No tonsillar exudate or tonsillar abscesses.  Eyes:     General: No scleral icterus.    Conjunctiva/sclera: Conjunctivae normal.  Cardiovascular:     Rate and Rhythm: Normal rate. Rhythm irregularly irregular.     Heart sounds:     No friction rub.  Pulmonary:     Effort: Pulmonary effort is normal.     Breath sounds: Examination of the left-middle field reveals decreased breath sounds. Examination of the left-lower field reveals decreased breath sounds. Decreased breath sounds present. No wheezing, rhonchi or rales.  Abdominal:     General: Abdomen is flat.     Palpations: There is no mass.     Tenderness: There is no abdominal tenderness. There is no guarding.     Hernia: No hernia is present.  Musculoskeletal:     Cervical back: Neck supple.     Right lower leg: 1+ Pitting Edema present.     Left lower leg: 1+ Pitting Edema present.  Lymphadenopathy:     Cervical: No cervical adenopathy.  Skin:    General: Skin is warm and dry.     Findings: No lesion or rash.  Neurological:     General: No focal deficit present.     Mental Status: She is alert. Mental status is at baseline.  Psychiatric:        Mood and Affect: Mood normal.        Behavior: Behavior normal.     Lab Results  Component Value Date   WBC 5.9 12/13/2022   HGB 15.7 (H) 12/13/2022   HCT 45.6 12/13/2022   PLT 107 (L) 12/13/2022   GLUCOSE 112 (H) 12/13/2022   CHOL 109 03/16/2022   TRIG 123.0 03/16/2022   HDL 30.20 (L) 03/16/2022   LDLCALC 54 03/16/2022   ALT 14 09/05/2022   AST 21 09/05/2022   NA 138 12/13/2022   K 3.5 12/13/2022    CL 109 12/13/2022   CREATININE 1.22 (H) 12/13/2022   BUN 20 12/13/2022   CO2 20 (L) 12/13/2022   TSH 1.42 03/16/2022   INR 1.6 (H) 12/08/2020   HGBA1C 6.3 12/12/2022   MICROALBUR 238.9 (H) 03/16/2022    EP PPM/ICD IMPLANT  Result Date: 11/22/2022 Conclusion: Unsuccessful upgrade of a BiV ICD secondary to an occlusion of the patient's left subclavian vein. Lewayne Bunting, MD  DG Chest Port 1 View  Result Date: 11/21/2022 CLINICAL DATA:  Pacemaker EXAM: PORTABLE CHEST 1 VIEW COMPARISON:  CXR 09/05/22 FINDINGS: Left-sided single lead pacemaker in place with a lead in the right ventricle, unchanged. Status post median sternotomy. Unchanged enlarged cardiac contours. Prominent bilateral interstitial opacities could represent atypical infection or pulmonary venous congestion. No pleural effusion. No pneumothorax. No radiographically apparent displaced rib fractures. Visualized upper abdomen is unremarkable. IMPRESSION: Left-sided single lead pacemaker in place with a lead in the right ventricle, unchanged. Electronically Signed   By: Lorenza Cambridge M.D.   On: 11/21/2022 15:33   DG Chest 2 View  Result Date: 12/12/2022 CLINICAL DATA:  Cough, shortness of breath EXAM: CHEST - 2 VIEW COMPARISON:  11/21/2022 FINDINGS: Transverse diameter of heart is increased. Central pulmonary vessels are more prominent. There are no signs of alveolar pulmonary edema. Linear density in left parahilar region may suggest scarring or subsegmental atelectasis. There is no focal pulmonary consolidation. There is no pleural effusion or pneumothorax. Patient's chin is partially obscuring the right apex. Pacemaker battery is seen in the left infraclavicular region. There is a metallic clamp in the region of left atrial appendage. IMPRESSION: Cardiomegaly. Central pulmonary vessels are more prominent suggesting mild CHF. There is no focal pulmonary consolidation or signs of alveolar pulmonary edema. Electronically Signed   By: Ernie Avena M.D.   On: 12/12/2022 16:25      Assessment & Plan:   Controlled type 2 diabetes mellitus without complication, without long-term current use of insulin (HCC) -     Hemoglobin A1c; Future  Stage 3a chronic kidney disease (HCC)- Renal function is stable.  Thrombocytopenia (HCC) -     CBC with Differential/Platelet; Future  Acute cough- CXR is c/w pulmonary edema. -     DG Chest 2 View; Future -     POC COVID-19 BinaxNow  SOB (shortness of breath)- BNP and troponin were elevated. She has been admitted. -     Brain natriuretic peptide; Future -     Troponin I (High Sensitivity); Future -     D-dimer, quantitative; Future     Follow-up: No follow-ups on file.  Sanda Linger, MD

## 2022-12-12 NOTE — ED Triage Notes (Signed)
Patient received a call from her MD , advised her to go to ER for admission due abnormal heart blood tests results taken today , no chest pain .

## 2022-12-12 NOTE — ED Notes (Signed)
ED TO INPATIENT HANDOFF REPORT  ED Nurse Name and Phone #: Aquilla Solian [phone]  S Name/Age/Gender [name] [name] 77 y.o. female Room/Bed: 017C/017C  Code Status   Code Status: Full Code  Home/SNF/Other Home Patient oriented to: self, place, time, and situation Is this baseline? Yes   Triage Complete: Triage complete  Chief Complaint Acute on chronic HFrEF (heart failure with reduced ejection fraction) (HCC) [I50.23]  Triage Note Patient received a call from her MD , advised her to go to ER for admission due abnormal heart blood tests results taken today , no chest pain .    Allergies Allergies  Allergen Reactions   Bee Venom Anaphylaxis   Ivp Dye [Iodinated Contrast Media] Anaphylaxis   Ace Inhibitors     angioedema   Atenolol     severe headaches   Codeine Nausea And Vomiting   Entresto [Sacubitril-Valsartan] Other (See Comments)    Chest pain    Isosorbide     Severe headaches   Shrimp [Shellfish Allergy] Swelling   Simvastatin     not sure   Jardiance [Empagliflozin] Other (See Comments)    Caused Boils    Penicillin G Rash    Level of Care/Admitting Diagnosis ED Disposition     ED Disposition  Admit   Condition  --   Comment  Hospital Area:  [facility] [[id number]]  Level of Care: Progressive [102]  Admit to Progressive based on following criteria: CARDIOVASCULAR & THORACIC of moderate stability with acute coronary syndrome symptoms/low risk myocardial infarction/hypertensive urgency/arrhythmias/heart failure potentially compromising stability and stable post cardiovascular intervention patients.  May admit patient to [facility] or [facility] if equivalent level of care is available:: Yes  Covid Evaluation: Asymptomatic - no recent exposure (last 10 days) testing not required  Diagnosis: Acute on chronic HFrEF (heart failure with reduced ejection fraction) [facility]) [[id number]]  Admitting Physician: John Giovanni [[id number]]  Attending  Physician: John Giovanni [[id number]]  Certification:: I certify this patient will need inpatient services for at least 2 midnights  Estimated Length of Stay: 2          B Medical/Surgery History Past Medical History:  Diagnosis Date   (HFimpEF) heart failure with improved ejection fraction (HCC)    a. 07/2018 Echo: EF 30-35%; b. 11/2019 Echo: EF 55-60%, no rwma, Gr2 DD, Nl RV size/fxn. Mild BAE. Mild MR/AI.   Arthritis    Asthma    Atopic dermatitis    CAD (coronary artery disease)    a. 2016 s/p CABG x 2 (LIMA->LAD, VG->OM); b. 08/2018 MV: EF 44%, no ischemia/infact.   Cardiac arrest (HCC) 10/2014   Cardiomyopathy, ischemic    a. 07/2018 Echo: EF 30-35%; 11/2019 Echo: EF 55-60%.   Carotid arterial disease (HCC)    a. 11/2019 Carotid U/S   CHF (congestive heart failure) (HCC)    CKD (chronic kidney disease), stage III (HCC)    COPD (chronic obstructive pulmonary disease) (HCC)    Diabetes mellitus without complication (HCC)    Dysrhythmia    Esophageal dilatation 2013   GERD (gastroesophageal reflux disease)    Glaucoma    Gout    Headache    Hypertension    Hypokalemia    Idiopathic angioedema    LGI bleed 08/06/2017   a. felt to be hemorrhoidal during that admission (no drop in Hgb).   Lower back pain    Paroxysmal atrial fibrillation (HCC)    a. Dx 2016-->h/o difficult to control rates (complicated by noncompliance), not  felt to be a candidate for ablation or antiarrhythmic due to noncompliance; b. Recurrent AF 2021 - converted w/ IV dilt; c. CHA2DS2VASc = 7-->Xarelto.   Personal history of noncompliance with medical treatment, presenting hazards to health    Prediabetes    Presence of permanent cardiac pacemaker    S/P CABG x 2 with clipping of LA appendage 11/14/2014   LIMA to LAD, SVG to OM, EVH via right thigh   Urine incontinence    Uterine fibroid    Past Surgical History:  Procedure Laterality Date   AV NODE ABLATION N/A 01/18/2021   Procedure: AV NODE  ABLATION;  Surgeon: [name], MD;  [facility]: [facility] INVASIVE CV LAB;  Service: Cardiovascular;  Laterality: N/A;   BALLOON DILATION N/A 10/10/2019   Procedure: BALLOON DILATION;  Surgeon: Kathi Der, MD;  Location: [facility] ENDOSCOPY;  Service: Gastroenterology;  Laterality: N/A;   BIOPSY  10/10/2019   Procedure: BIOPSY;  Surgeon: Kathi Der, MD;  Location: [facility] ENDOSCOPY;  Service: Gastroenterology;;   BIOPSY  10/11/2019   Procedure: BIOPSY;  Surgeon: Kathi Der, MD;  Location: [facility] ENDOSCOPY;  Service: Gastroenterology;;   Earnstine Regal N/A 11/21/2022   Procedure: BIVI  ICD UPGRADE;  Surgeon: [name], MD;  Location: [facility] INVASIVE CV LAB;  Service: Cardiovascular;  Laterality: N/A;   CARDIOVERSION N/A 11/18/2014   Procedure: CARDIOVERSION;  Surgeon: Chrystie Nose, MD;  Location: [facility] OR;  Service: Cardiovascular;  Laterality: N/A;   CARDIOVERSION N/A 12/25/2017   Procedure: CARDIOVERSION;  Surgeon: Dolores Patty, MD;  Location: [facility] ENDOSCOPY;  Service: Cardiovascular;  Laterality: N/A;   CLIPPING OF ATRIAL APPENDAGE N/A 11/14/2014   Procedure: CLIPPING OF ATRIAL APPENDAGE;  Surgeon: Purcell Nails, MD;  Location: [facility] OR;  Service: Open Heart Surgery;  Laterality: N/A;   COLONOSCOPY  2013   COLONOSCOPY WITH PROPOFOL N/A 10/11/2019   Procedure: COLONOSCOPY WITH PROPOFOL;  Surgeon: Kathi Der, MD;  Location: [facility] ENDOSCOPY;  Service: Gastroenterology;  Laterality: N/A;   CORONARY ARTERY BYPASS GRAFT N/A 11/14/2014   Procedure: CORONARY ARTERY BYPASS GRAFTING (CABG)TIMES 2 USING LEFT INTERNAL MAMMARY ARTERY AND RIGHT SAPHENOUS VEIN HARVESTED ENDOSCOPICALLY;  Surgeon: Purcell Nails, MD;  Location: [facility] OR;  Service: Open Heart Surgery;  Laterality: N/A;   ESOPHAGOGASTRODUODENOSCOPY (EGD) WITH PROPOFOL N/A 10/10/2019   Procedure: ESOPHAGOGASTRODUODENOSCOPY (EGD) WITH PROPOFOL;  Surgeon: Kathi Der, MD;  Location: [facility] ENDOS[facility];  Service: Gastroenterology;  Laterality:  N/A;   ESOPHAGOGASTRODUODENOSCOPY (EGD) WITH PROPOFOL N/A 10/27/2019   Procedure: ESOPHAGOGASTRODUODENOSCOPY (EGD) WITH PROPOFOL;  Surgeon: Kerin Salen, MD;  Location: [facility] ENDOSCOPY;  Service: Gastroenterology;  Laterality: N/A;   GIVENS CAPSULE STUDY N/A 10/27/2019   Procedure: GIVENS CAPSULE STUDY;  Surgeon: Kerin Salen, MD;  Location: [facility] ENDOSCOPY;  Service: Gastroenterology;  Laterality: N/A;   INSERT / REPLACE / REMOVE PACEMAKER     LEFT HEART CATHETERIZATION WITH CORONARY ANGIOGRAM N/A 11/04/2014   Procedure: LEFT HEART CATHETERIZATION WITH CORONARY ANGIOGRAM;  Surgeon: Lennette Bihari, MD;  Location: [facility] CATH LAB;  Service: Cardiovascular;  Laterality: N/A;   PACEMAKER IMPLANT N/A 01/18/2021   Procedure: PACEMAKER IMPLANT;  Surgeon: [name], MD;  [facility]VAS[facility] CV LAB;  Service: Cardiovascular;  Laterality: N/A;   PACEMAKER IMPLANT Left    POLYPECTOMY  10/11/2019   Procedure: POLYPECTOMY;  Surgeon: Kathi Der, MD;  Location: [facility] ENDOSCOPY;  Service: Gastroenterology;;   RIGHT HEART CATH N/A 06/08/2022   Procedure: RIGHT HEART CATH;  Surgeon: Dolores Patty, MD;  Location: [facility]  INVASIVE CV LAB;  Service: Cardiovascular;  Laterality: N/A;   TEE WITHOUT CARDIOVERSION N/A 11/14/2014   Procedure: TRANSESOPHAGEAL ECHOCARDIOGRAM (TEE);  Surgeon: Purcell Nails, MD;  Location: [facility] OR;  Service: Open Heart Surgery;  Laterality: N/A;   TEE WITHOUT CARDIOVERSION N/A 12/25/2017   Procedure: TRANSESOPHAGEAL ECHOCARDIOGRAM (TEE);  Surgeon: Dolores Patty, MD;  Location: [facility] ENDOSCOPY;  Service: Cardiovascular;  Laterality: N/A;   TEMPORARY PACEMAKER INSERTION  11/04/2014   Procedure: TEMPORARY PACEMAKER INSERTION;  Surgeon: Lennette Bihari, MD;  Location: [facility] CATH LAB;  Service: Cardiovascular;;   TOOTH EXTRACTION       A IV Location/Drains/Wounds Patient Lines/Drains/Airways Status     Active Line/Drains/Airways     Name Placement date Placement time Site Days    Peripheral IV 12/12/22 20 G Anterior;Proximal;Right Forearm 12/12/22  2000  Forearm  less than 1   Wound / Incision (Open or Dehisced) 12/08/20 (MASD) Moisture Associated Skin Damage Breast Lower;Right 12/08/20  1231  Breast  734            Intake/Output Last 24 hours No intake or output data in the 24 hours ending 12/12/22 2258  Labs/Imaging Results for orders placed or performed during the hospital encounter of 12/12/22 (from the past 48 hour(s))  Basic metabolic panel     Status: Abnormal   Collection Time: 12/12/22  8:01 PM  Result Value Ref Range   Sodium 141 135 - 145 mmol/L   Potassium 3.8 3.5 - 5.1 mmol/L   Chloride 112 (H) 98 - 111 mmol/L   CO2 18 (L) 22 - 32 mmol/L   Glucose, Bld 126 (H) 70 - 99 mg/dL    Comment: Glucose reference range applies only to samples taken after fasting for at least 8 hours.   BUN 19 8 - 23 mg/dL   Creatinine, Ser 2.13 (H) 0.44 - 1.00 mg/dL   Calcium 9.2 8.9 - 08.6 mg/dL   GFR, Estimated 42 (L) >60 mL/min    Comment: (NOTE) Calculated using the CKD-EPI Creatinine Equation (2021)    Anion gap 11 5 - 15    Comment: Performed at [facility], [address] [place], [place] [address]  CBC with Differential     Status: Abnormal   Collection Time: 12/12/22  8:01 PM  Result Value Ref Range   WBC 6.3 4.0 - 10.5 K/uL   RBC 5.23 (H) 3.87 - 5.11 MIL/uL   Hemoglobin 15.5 (H) 12.0 - 15.0 g/dL   HCT 96.2 (H) 95.2 - 84.1 %   MCV 90.8 80.0 - 100.0 fL   MCH 29.6 26.0 - 34.0 pg   MCHC 32.6 30.0 - 36.0 g/dL   RDW 32.4 (H) 40.1 - 02.7 %   Platelets 119 (L) 150 - 400 K/uL    Comment: REPEATED TO VERIFY   nRBC 0.0 0.0 - 0.2 %   Neutrophils Relative % 71 %   Neutro Abs 4.5 1.7 - 7.7 K/uL   Lymphocytes Relative 16 %   Lymphs Abs 1.0 0.7 - 4.0 K/uL   Monocytes Relative 11 %   Monocytes Absolute 0.7 0.1 - 1.0 K/uL   Eosinophils Relative 1 %   Eosinophils Absolute 0.0 0.0 - 0.5 K/uL   Basophils Relative 1 %   Basophils Absolute 0.1 0.0 - 0.1  K/uL   Immature Granulocytes 0 %   Abs Immature Granulocytes 0.01 0.00 - 0.07 K/uL    Comment: Performed at [facility], [address], [place], [place] [address]  Brain natriuretic peptide     Status: Abnormal   Collection Time: 12/12/22  8:01 PM  Result Value Ref Range   B Natriuretic Peptide 777.9 (H) 0.0 - 100.0 pg/mL    Comment: Performed at [facility], [address], [place], [place] [address]  Troponin I (High Sensitivity)     Status: Abnormal   Collection Time: 12/12/22  8:01 PM  Result Value Ref Range   Troponin I (High Sensitivity) 42 (H) <18 ng/L    Comment: (NOTE) Elevated high sensitivity troponin I (hsTnI) values and significant  changes across serial measurements may suggest ACS but many other  chronic and acute conditions are known to elevate hsTnI results.  Refer to the "Links" section for chest pain algorithms and additional  guidance. Performed at [facility], [address], [place], [place] [address]   Troponin I (High Sensitivity)     Status: Abnormal   Collection Time: 12/12/22  9:38 PM  Result Value Ref Range   Troponin I (High Sensitivity) 43 (H) <18 ng/L    Comment: (NOTE) Elevated high sensitivity troponin I (hsTnI) values and significant  changes across serial measurements may suggest ACS but many other  chronic and acute conditions are known to elevate hsTnI results.  Refer to the "Links" section for chest pain algorithms and additional  guidance. Performed at [facility], [address] [place], [place] [address]    DG Chest 2 View  Result Date: 12/12/2022 CLINICAL DATA:  Cough, shortness of breath EXAM: CHEST - 2 VIEW COMPARISON:  11/21/2022 FINDINGS: Transverse diameter of heart is increased. Central pulmonary vessels are more prominent. There are no signs of alveolar pulmonary edema. Linear density in left parahilar region may suggest scarring or subsegmental atelectasis. There is no focal pulmonary consolidation.  There is no pleural effusion or pneumothorax. Patient's chin is partially obscuring the right apex. Pacemaker battery is seen in the left infraclavicular region. There is a metallic clamp in the region of left atrial appendage. IMPRESSION: Cardiomegaly. Central pulmonary vessels are more prominent suggesting mild CHF. There is no focal pulmonary consolidation or signs of alveolar pulmonary edema. Electronically Signed   By: Ernie Avena M.D.   On: 12/12/2022 16:25    Pending Labs Unresulted Labs (From admission, onward)     Start     Ordered   12/13/22 0500  Basic metabolic panel  Tomorrow morning,   R        12/12/22 2253   12/13/22 0500  CBC  Tomorrow morning,   R        12/12/22 2253            Vitals/Pain Today's Vitals   12/12/22 1941 12/12/22 1955 12/12/22 2000 12/12/22 2204  BP: (!) 182/121 (!) 166/111 (!) 186/101   Pulse: 88  97   Resp: 16 (!) 27 (!) 29   Temp: 99.2 F (37.3 C)     TempSrc: Oral     SpO2: 95%  95% 97%  PainSc:        Isolation Precautions No active isolations  Medications Medications  allopurinol (ZYLOPRIM) tablet 100 mg (has no administration in time range)  atorvastatin (LIPITOR) tablet 10 mg (has no administration in time range)  gabapentin (NEURONTIN) capsule 100 mg (has no administration in time range)  hydrALAZINE (APRESOLINE) tablet 25 mg (has no administration in time range)  losartan (COZAAR) tablet 50 mg (has no administration in time range)  pantoprazole (PROTONIX) EC tablet 40 mg (  has no administration in time range)  Rivaroxaban (XARELTO) tablet 15 mg (has no administration in time range)  fluticasone furoate-vilanterol (BREO ELLIPTA) 100-25 MCG/ACT 1 puff (has no administration in time range)    And  umeclidinium bromide (INCRUSE ELLIPTA) 62.5 MCG/ACT 1 puff (has no administration in time range)  acetaminophen (TYLENOL) tablet 650 mg (has no administration in time range)    Or  acetaminophen (TYLENOL) suppository 650 mg (has  no administration in time range)  insulin aspart (novoLOG) injection 0-9 Units (has no administration in time range)  insulin aspart (novoLOG) injection 0-5 Units (has no administration in time range)  furosemide (LASIX) injection 80 mg (has no administration in time range)  spironolactone (ALDACTONE) tablet 25 mg (has no administration in time range)  furosemide (LASIX) injection 80 mg (80 mg Intravenous Given 12/12/22 2138)    Mobility walks with device     Focused Assessments     R Recommendations: See Admitting Provider Note  Report given to:   Additional Notes:

## 2022-12-12 NOTE — ED Provider Notes (Signed)
[place] EMERGENCY DEPARTMENT AT [facility] Provider Note  Medical Decision Making   HPI: [name] [name] is a 77 y.o. female with history perinent for HFrEF with EF of 20%, COPD not on home oxygen, CAD, CKD, thrombocytopenia, A-fib, obesity, OSA who presents complaining of abnormal labs. Patient arrived via POV from home.  History provided by patient, chart review.  No interpreter required for this encounter.  Patient reports that she is here in the emergency department because her outpatient physician instructed her to present for abnormal labs.  Reports that she had a pacemaker placed approximately 1 week ago.  Reports that she had a wound check that went well.  Reports that since that check because she has had progressive shortness of breath, dyspnea with exertion, cough, denies fever, chest pain, lower extremity swelling.  Reports that she has been adherent to all of her home medications.  Reports that she had a regularly scheduled outpatient appointment today, had labs checked, and they called her back and told her to come to the emergency department for further evaluation.  ROS: As per HPI. Please see MAR for complete past medical history, surgical history, and social history.   Physical exam is pertinent for hypertension, rales bilaterally, no increased work of breathing, saturating well on room air at rest, dyspnea on exertion.  The differential includes but is not limited to ACS, demand ischemia, heart failure exacerbation, hypervolemia, AKI, return electrolyte derangement, arrhythmia  Additional history obtained from: Chart review External records from outside source obtained and reviewed including: Reviewed labs from outpatient clinic earlier today, elevated troponin, elevated BNP.  Also reviewed outpatient note, however no yet to be completed  ED provider interpretation of radiology/imaging: Chest x-ray with increased interstitial markings bilaterally concerning for  pulmonary edema, no focal airspace opacification, pneumothorax, bony displacement.  Labs ordered were interpreted by myself as well as my attending and were incorporated into the medical decision making process for this patient.  ED provider interpretation of labs: CBC without leukocytosis.  Elevated hemoglobin similar to prior, thrombocytopenia similar to prior.  BMP without AKI or emergent electrolyte derangement, creatinine of 1.3 is at patient's baseline.  Elevated BNP of 778.  Initial troponin elevated at 42, this is increased from outpatient level of 40, further delta is 43.  Interventions: Lasix 80 IV  See the EMR for full details regarding lab and imaging results.  Patient is dyspneic with movement here in the emergency department, however saturating well on room air, incision on left anterior chest wall is C/C/I, no evidence of infection.  Labs are indicated.  Outpatient labs are concerning for heart failure exacerbation with demand ischemia.  Labs here in the emergency department redemonstrate elevated BNP consistent with HFrEF exacerbation, troponin continues to be mildly elevated and rising, most concerning for demand ischemia, do not suspect NSTEMI given patient is chest pain-free, no significant uptrend in troponin.  Will not start heparin at this time.  Do feel that patient warrants admission for IV diuresis, home patient is 28 p.o. torsemide, therefore 80 IV Lasix ordered, hospitalist consulted for admission, discussed patient with Dr. Loney Loh, who accepted the patient to their service.  No additional acute vents while patient was under my care in the ED.  Consults: Hospitalists  Disposition: ADMIT: I believe the patient requires admission for further care and management. The patient was admitted to hospitalists. Please see inpatient provider note for additional treatment plan details.   The plan for this patient was discussed with Dr. Hyacinth Meeker,  who voiced agreement and who oversaw  evaluation and treatment of this patient.  Clinical Impression:  1. Acute on chronic congestive heart failure, unspecified heart failure type (HCC)    Admit  Therapies: These medications and interventions were provided for the patient while in the ED. Medications  allopurinol (ZYLOPRIM) tablet 100 mg (has no administration in time range)  atorvastatin (LIPITOR) tablet 10 mg (has no administration in time range)  gabapentin (NEURONTIN) capsule 100 mg (has no administration in time range)  hydrALAZINE (APRESOLINE) tablet 25 mg (has no administration in time range)  losartan (COZAAR) tablet 50 mg (has no administration in time range)  pantoprazole (PROTONIX) EC tablet 40 mg (has no administration in time range)  Rivaroxaban (XARELTO) tablet 15 mg (has no administration in time range)  fluticasone furoate-vilanterol (BREO ELLIPTA) 100-25 MCG/ACT 1 puff (has no administration in time range)    And  umeclidinium bromide (INCRUSE ELLIPTA) 62.5 MCG/ACT 1 puff (has no administration in time range)  acetaminophen (TYLENOL) tablet 650 mg (has no administration in time range)    Or  acetaminophen (TYLENOL) suppository 650 mg (has no administration in time range)  insulin aspart (novoLOG) injection 0-9 Units (has no administration in time range)  insulin aspart (novoLOG) injection 0-5 Units (has no administration in time range)  furosemide (LASIX) injection 80 mg (has no administration in time range)  spironolactone (ALDACTONE) tablet 25 mg (has no administration in time range)  hydrALAZINE (APRESOLINE) injection 5 mg (has no administration in time range)  albuterol (PROVENTIL) (2.5 MG/3ML) 0.083% nebulizer solution 2.5 mg (has no administration in time range)  furosemide (LASIX) injection 80 mg (80 mg Intravenous Given 12/12/22 2138)    MDM generated using voice dictation software and may contain dictation errors.  Please contact me for any clarification or with any questions.  Clinical  Complexity A medically appropriate history, review of systems, and physical exam was performed.  Collateral history obtained from: Chart review I personally reviewed the labs, EKG, imaging as discussed above. Patient's presentation is most consistent with acute complicated illness / injury requiring diagnostic workup Treatment: Hospitalization Medications: Prescription Discussed patient's care with providers from the following different specialties: Hospitalists  Physical Exam   ED Triage Vitals  Enc Vitals Group     BP 12/12/22 1941 (!) 182/121     Pulse Rate 12/12/22 1941 88     Resp 12/12/22 1941 16     Temp 12/12/22 1941 99.2 F (37.3 C)     Temp Source 12/12/22 1941 Oral     SpO2 12/12/22 1941 95 %     Weight --      Height --      Head Circumference --      Peak Flow --      Pain Score 12/12/22 1941 0     Pain Loc --      Pain Edu? --      Excl. in GC? --      Physical Exam Vitals and nursing note reviewed.  Constitutional:      General: She is not in acute distress.    Appearance: She is well-developed.  HENT:     Head: Normocephalic and atraumatic.  Eyes:     Extraocular Movements: Extraocular movements intact.     Conjunctiva/sclera: Conjunctivae normal.  Cardiovascular:     Rate and Rhythm: Normal rate and regular rhythm.     Heart sounds: No murmur heard. Pulmonary:     Effort: Pulmonary effort is normal. No respiratory distress.  Breath sounds: Examination of the right-middle field reveals rales. Examination of the left-middle field reveals rales. Examination of the right-lower field reveals rales. Examination of the left-lower field reveals rales. Rales present.  Abdominal:     Palpations: Abdomen is soft.     Tenderness: There is no abdominal tenderness.  Musculoskeletal:        General: No swelling.     Cervical back: Neck supple.     Right lower leg: Edema (Trace) present.     Left lower leg: Edema (Trace) present.  Skin:    General: Skin is  warm and dry.     Capillary Refill: Capillary refill takes less than 2 seconds.  Neurological:     Mental Status: She is alert.  Psychiatric:        Mood and Affect: Mood normal.       Procedure Note  Procedures  No orders to display    Julianne Rice, MD Emergency Medicine, PGY-2   Curley Spice, MD 12/12/22 1610    Eber Hong, MD 12/13/22 [phone]

## 2022-12-12 NOTE — ED Provider Triage Note (Signed)
Emergency Medicine Provider Triage Evaluation Note  MACEL YEARSLEY , a 77 y.o. female  was evaluated in triage.  Pt complains of shortness of breath for the past couple of days, she thinks it was a cold, recently had pacemaker placed, she was seen by her PCP today who ordered labs and told her to come into the ED as they were concerned for a "heart attack"..  Review of Systems  Positive: Shortness of breath, cough Negative: Fever  Physical Exam  BP (!) 182/121 (BP Location: Right Arm)   Pulse 88   Temp 99.2 F (37.3 C) (Oral)   Resp 16   SpO2 95%  Gen:   Awake, no distress   Resp:  Normal effort  MSK:   Moves extremities without difficulty  Other:    Medical Decision Making  Medically screening exam initiated at 7:46 PM.  Appropriate orders placed.  KALANI BARAY was informed that the remainder of the evaluation will be completed by another provider, this initial triage assessment does not replace that evaluation, and the importance of remaining in the ED until their evaluation is complete.     Ma Rings, [place] 12/12/22 1947

## 2022-12-12 NOTE — H&P (Signed)
History and Physical    [name] [name] ZOX:[id number] DOB: March 20, 1946 DOA: 12/12/2022  PCP: [name], MD  Chief Complaint: Shortness of breath  HPI: [name] [name] is a 77 y.o. female with medical history significant of CAD status post CABG x 2 in 2016, history of cardiac arrest, permanent A-fib on Xarelto, status post PPM, chronic HFrEF, moderate to severe MR, moderate TR, CKD stage 3a, hypertension, hyperlipidemia, idiopathic angioedema, type 2 diabetes, carotid arterial disease, COPD, GERD, gout, thrombocytopenia.  Patient was recently seen by cardiology on 4/25 for shortness of breath and was advised to increase torsemide to 30 mg daily.  Seen by PCP today and sent to the ED due to elevated BNP (764) and troponin 40 on labs.  On arrival to the ED, blood pressure 182/121.  Tachypneic to the 20s in the ED.  Afebrile.  Not tachycardic or hypoxemic.  Labs done in the ED showing no leukocytosis, hemoglobin 15.5 (chronically slightly elevated and stable), platelet count 119k (stable), bicarb 18 (chronically slightly low), glucose 126, creatinine 1.3 (stable), BNP 777, troponin 42.  EKG without acute ischemic changes.  Chest x-ray showing cardiomegaly with findings suggesting mild CHF; no focal pulmonary consolidation or signs of alveolar pulmonary edema. Patient was given IV Lasix 80 mg.  Patient is a poor historian.  States she is always short of breath due to her asthma and is not sure if her breathing is any worse than her baseline.  She is coughing and endorsing orthopnea.  Denies chest pain.  She is taking torsemide 20 mg daily and has not increased the dose as advised by cardiology.  She does not watch her fluid intake, states "I drink all I can."  Also she is not watching her dietary sodium intake.  She has no other complaints.  Review of Systems:  Review of Systems  All other systems reviewed and are negative.   Past Medical History:  Diagnosis Date   (HFimpEF) heart failure with  improved ejection fraction (HCC)    a. 07/2018 Echo: EF 30-35%; b. 11/2019 Echo: EF 55-60%, no rwma, Gr2 DD, Nl RV size/fxn. Mild BAE. Mild MR/AI.   Arthritis    Asthma    Atopic dermatitis    CAD (coronary artery disease)    a. 2016 s/p CABG x 2 (LIMA->LAD, VG->OM); b. 08/2018 MV: EF 44%, no ischemia/infact.   Cardiac arrest (HCC) 10/2014   Cardiomyopathy, ischemic    a. 07/2018 Echo: EF 30-35%; 11/2019 Echo: EF 55-60%.   Carotid arterial disease (HCC)    a. 11/2019 Carotid U/S   CHF (congestive heart failure) (HCC)    CKD (chronic kidney disease), stage III (HCC)    COPD (chronic obstructive pulmonary disease) (HCC)    Diabetes mellitus without complication (HCC)    Dysrhythmia    Esophageal dilatation 2013   GERD (gastroesophageal reflux disease)    Glaucoma    Gout    Headache    Hypertension    Hypokalemia    Idiopathic angioedema    LGI bleed 08/06/2017   a. felt to be hemorrhoidal during that admission (no drop in Hgb).   Lower back pain    Paroxysmal atrial fibrillation (HCC)    a. Dx 2016-->h/o difficult to control rates (complicated by noncompliance), not felt to be a candidate for ablation or antiarrhythmic due to noncompliance; b. Recurrent AF 2021 - converted w/ IV dilt; c. CHA2DS2VASc = 7-->Xarelto.   Personal history of noncompliance with medical treatment, presenting hazards to health  Prediabetes    Presence of permanent cardiac pacemaker    S/P CABG x 2 with clipping of LA appendage 11/14/2014   LIMA to LAD, SVG to OM, EVH via right thigh   Urine incontinence    Uterine fibroid     Past Surgical History:  Procedure Laterality Date   AV NODE ABLATION N/A 01/18/2021   Procedure: AV NODE ABLATION;  Surgeon: [name], MD;  Location: [facility] INVASIVE CV LAB;  Service: Cardiovascular;  Laterality: N/A;   BALLOON DILATION N/A 10/10/2019   Procedure: BALLOON DILATION;  Surgeon: Kathi Der, MD;  Location: [facility] ENDOSCOPY;  Service: Gastroenterology;   Laterality: N/A;   BIOPSY  10/10/2019   Procedure: BIOPSY;  Surgeon: Kathi Der, MD;  Location: [facility] ENDOSCOPY;  Service: Gastroenterology;;   BIOPSY  10/11/2019   Procedure: BIOPSY;  Surgeon: Kathi Der, MD;  Location: [facility] ENDOSCOPY;  Service: Gastroenterology;;   Earnstine Regal N/A 11/21/2022   Procedure: BIVI  ICD UPGRADE;  Surgeon: [name], MD;  Location: [facility] INVASIVE CV LAB;  Service: Cardiovascular;  Laterality: N/A;   CARDIOVERSION N/A 11/18/2014   Procedure: CARDIOVERSION;  Surgeon: Chrystie Nose, MD;  Location: [facility] OR;  Service: Cardiovascular;  Laterality: N/A;   CARDIOVERSION N/A 12/25/2017   Procedure: CARDIOVERSION;  Surgeon: Dolores Patty, MD;  Location: [facility] ENDOSCOPY;  Service: Cardiovascular;  Laterality: N/A;   CLIPPING OF ATRIAL APPENDAGE N/A 11/14/2014   Procedure: CLIPPING OF ATRIAL APPENDAGE;  Surgeon: Purcell Nails, MD;  Location: [facility] OR;  Service: Open Heart Surgery;  Laterality: N/A;   COLONOSCOPY  2013   COLONOSCOPY WITH PROPOFOL N/A 10/11/2019   Procedure: COLONOSCOPY WITH PROPOFOL;  Surgeon: Kathi Der, MD;  Location: [facility] ENDOSCOPY;  Service: Gastroenterology;  Laterality: N/A;   CORONARY ARTERY BYPASS GRAFT N/A 11/14/2014   Procedure: CORONARY ARTERY BYPASS GRAFTING (CABG)TIMES 2 USING LEFT INTERNAL MAMMARY ARTERY AND RIGHT SAPHENOUS VEIN HARVESTED ENDOSCOPICALLY;  Surgeon: Purcell Nails, MD;  Location: [facility] OR;  Service: Open Heart Surgery;  Laterality: N/A;   ESOPHAGOGASTRODUODENOSCOPY (EGD) WITH PROPOFOL N/A 10/10/2019   Procedure: ESOPHAGOGASTRODUODENOSCOPY (EGD) WITH PROPOFOL;  Surgeon: Kathi Der, MD;  Location: [facility] ENDOSCOPY;  Service: Gastroenterology;  Laterality: N/A;   ESOPHAGOGASTRODUODENOSCOPY (EGD) WITH PROPOFOL N/A 10/27/2019   Procedure: ESOPHAGOGASTRODUODENOSCOPY (EGD) WITH PROPOFOL;  Surgeon: Kerin Salen, MD;  Location: [facility] ENDOSCOPY;  Service: Gastroenterology;  Laterality: N/A;   GIVENS CAPSULE STUDY N/A 10/27/2019    Procedure: GIVENS CAPSULE STUDY;  Surgeon: Kerin Salen, MD;  Location: [facility] ENDOSCOPY;  Service: Gastroenterology;  Laterality: N/A;   INSERT / REPLACE / REMOVE PACEMAKER     LEFT HEART CATHETERIZATION WITH CORONARY ANGIOGRAM N/A 11/04/2014   Procedure: LEFT HEART CATHETERIZATION WITH CORONARY ANGIOGRAM;  Surgeon: Lennette Bihari, MD;  Location: [facility] CATH LAB;  Service: Cardiovascular;  Laterality: N/A;   PACEMAKER IMPLANT N/A 01/18/2021   Procedure: PACEMAKER IMPLANT;  Surgeon: [name], MD;  Location: [facility] INVASIVE CV LAB;  Service: Cardiovascular;  Laterality: N/A;   PACEMAKER IMPLANT Left    POLYPECTOMY  10/11/2019   Procedure: POLYPECTOMY;  Surgeon: Kathi Der, MD;  Location: [facility] ENDOSCOPY;  Service: Gastroenterology;;   RIGHT HEART CATH N/A 06/08/2022   Procedure: RIGHT HEART CATH;  Surgeon: Dolores Patty, MD;  Location: [facility] INVASIVE CV LAB;  Service: Cardiovascular;  Laterality: N/A;   TEE WITHOUT CARDIOVERSION N/A 11/14/2014   Procedure: TRANSESOPHAGEAL ECHOCARDIOGRAM (TEE);  Surgeon: Purcell Nails, MD;  Location: [facility] OR;  Service: Open Heart Surgery;  Laterality: N/A;  TEE WITHOUT CARDIOVERSION N/A 12/25/2017   Procedure: TRANSESOPHAGEAL ECHOCARDIOGRAM (TEE);  Surgeon: Dolores Patty, MD;  Location: [facility] ENDOSCOPY;  Service: Cardiovascular;  Laterality: N/A;   TEMPORARY PACEMAKER INSERTION  11/04/2014   Procedure: TEMPORARY PACEMAKER INSERTION;  Surgeon: Lennette Bihari, MD;  Location: [facility] CATH LAB;  Service: Cardiovascular;;   TOOTH EXTRACTION       reports that she quit smoking about 12 months ago. Her smoking use included cigarettes. She has a 28.00 pack-year smoking history. She has never used smokeless tobacco. She reports that she does not drink alcohol and does not use drugs.  Allergies  Allergen Reactions   Bee Venom Anaphylaxis   Ivp Dye [Iodinated Contrast Media] Anaphylaxis   Ace Inhibitors     angioedema   Atenolol     severe headaches   Codeine  Nausea And Vomiting   Entresto [Sacubitril-Valsartan] Other (See Comments)    Chest pain    Isosorbide     Severe headaches   Shrimp [Shellfish Allergy] Swelling   Simvastatin     not sure   Jardiance [Empagliflozin] Other (See Comments)    Caused Boils    Penicillin G Rash    Family History  Problem Relation Age of Onset   Cancer Mother        LYMPHOMA   Heart disease Father    Other Father        TB   CVA Sister    Prostate cancer Brother 3   Diabetes Brother     Prior to Admission medications   Medication Sig Start Date End Date Taking? Authorizing Provider  acetaminophen (TYLENOL) 325 MG tablet Take 2 tablets (650 mg total) by mouth every 4 (four) hours as needed for headache or mild pain. 10/11/22   [name], MD  acetaminophen (TYLENOL) 500 MG tablet Take 500 mg by mouth every 6 (six) hours as needed for moderate pain.    [provider]  albuterol (VENTOLIN HFA) 108 (90 Base) MCG/ACT inhaler Inhale 1-2 puffs into the lungs every 6 (six) hours as needed for wheezing or shortness of breath. 03/20/22   Rising, Lurena Joiner, PA-C  allopurinol (ZYLOPRIM) 100 MG tablet Take 100 mg by mouth daily. 07/19/21   [provider]  atorvastatin (LIPITOR) 10 MG tablet Take 1 tablet (10 mg total) by mouth at bedtime. 10/11/22   [name], MD  gabapentin (NEURONTIN) 100 MG capsule Take 1 capsule (100 mg total) by mouth at bedtime. 11/23/22   Vivi Barrack, DPM  hydrALAZINE (APRESOLINE) 25 MG tablet Take 1 tablet (25 mg total) by mouth 3 (three) times daily. 09/01/22 09/01/23  [name], MD  isosorbide mononitrate (IMDUR) 30 MG 24 hr tablet Take 1 tablet (30 mg total) by mouth daily. 11/01/22 11/01/23  Laurey Morale, MD  losartan (COZAAR) 50 MG tablet Take 1 tablet (50 mg total) by mouth daily. 10/11/22   [name], MD  metFORMIN (GLUCOPHAGE) 500 MG tablet TAKE 1 TABLET (500 MG TOTAL) BY MOUTH DAILY WITH BREAKFAST (MEAL) 11/25/22   [name], MD   Naphazoline HCl (CLEAR EYES OP) Place 1 drop into both eyes daily as needed (irritation).    [provider]  pantoprazole (PROTONIX) 40 MG tablet Take 1 tablet (40 mg total) by mouth daily. Follow-up appt due in Marcht must see provider for future refills 09/27/22   [name], MD  Rivaroxaban (XARELTO) 15 MG TABS tablet Take 1 tablet (15 mg total) by mouth  daily with supper. NDC # [id number] Lot #  [id number] Exp- 09/16/23 11/30/22   [name], MD  spironolactone (ALDACTONE) 25 MG tablet Take 1 tablet (25 mg total) by mouth daily. 10/11/22   [name], MD  sucralfate (CARAFATE) 1 GM/10ML suspension Take 10 mLs (1 g total) by mouth 4 (four) times daily -  with meals and at bedtime. Patient taking differently: Take 1 g by mouth daily as needed (indigestion). 09/06/22   Dione Booze, MD  torsemide (DEMADEX) 20 MG tablet Take 1.5 tablets (30 mg total) by mouth daily. 12/08/22   [name], MD  TRELEGY ELLIPTA 100-62.5-25 MCG/ACT AEPB Inhale ONE PUFF into THE lungs daily 06/27/22   [name], MD    Physical Exam: Vitals:   12/12/22 1941 12/12/22 1955 12/12/22 2000  BP: (!) 182/121 (!) 166/111 (!) 186/101  Pulse: 88  97  Resp: 16 (!) 27 (!) 29  Temp: 99.2 F (37.3 C)    TempSrc: Oral    SpO2: 95%  95%    Physical Exam Vitals reviewed.  Constitutional:      General: She is not in acute distress. HENT:     Head: Normocephalic and atraumatic.  Eyes:     Extraocular Movements: Extraocular movements intact.  Neck:     Comments: +JVD Cardiovascular:     Rate and Rhythm: Normal rate and regular rhythm.     Pulses: Normal pulses.  Pulmonary:     Effort: Pulmonary effort is normal. No respiratory distress.     Breath sounds: Rales present.     Comments: Very mild scattered wheezing Abdominal:     General: Bowel sounds are normal. There is no distension.     Palpations: Abdomen is soft.     Tenderness: There is no abdominal tenderness.   Musculoskeletal:     Cervical back: Normal range of motion.     Comments: Trace bilateral lower extremity edema  Skin:    General: Skin is warm and dry.  Neurological:     General: No focal deficit present.     Mental Status: She is alert and oriented to person, place, and time.     Labs on Admission: I have personally reviewed following labs and imaging studies  CBC: Recent Labs  Lab 12/12/22 1656 12/12/22 2001  WBC 6.2 6.3  NEUTROABS 4.5 4.5  HGB 15.3* 15.5*  HCT 46.2* 47.5*  MCV 90.4 90.8  PLT 107.0* 119*   Basic Metabolic Panel: Recent Labs  Lab 12/12/22 2001  NA 141  K 3.8  CL 112*  CO2 18*  GLUCOSE 126*  BUN 19  CREATININE 1.30*  CALCIUM 9.2   GFR: Estimated Creatinine Clearance: 40.6 mL/min (A) (by C-G formula based on SCr of 1.3 mg/dL (H)). Liver Function Tests: No results for input(s): "AST", "ALT", "ALKPHOS", "BILITOT", "PROT", "ALBUMIN" in the last 168 hours. No results for input(s): "LIPASE", "AMYLASE" in the last 168 hours. No results for input(s): "AMMONIA" in the last 168 hours. Coagulation Profile: No results for input(s): "INR", "PROTIME" in the last 168 hours. Cardiac Enzymes: No results for input(s): "CKTOTAL", "CKMB", "CKMBINDEX", "TROPONINI" in the last 168 hours. BNP (last 3 results) Recent Labs    12/12/22 1656  PROBNP 764.0*   HbA1C: Recent Labs    12/12/22 1656  HGBA1C 6.3   CBG: No results for input(s): "GLUCAP" in the last 168 hours. Lipid Profile: No results for input(s): "CHOL", "HDL", "LDLCALC", "TRIG", "CHOLHDL", "LDLDIRECT" in the last 72 hours. Thyroid  Function Tests: No results for input(s): "TSH", "T4TOTAL", "FREET4", "T3FREE", "THYROIDAB" in the last 72 hours. Anemia Panel: No results for input(s): "VITAMINB12", "FOLATE", "FERRITIN", "TIBC", "IRON", "RETICCTPCT" in the last 72 hours. Urine analysis:    Component Value Date/Time   COLORURINE AMBER (A) 06/06/2022 0830   APPEARANCEUR CLEAR 06/06/2022 0830    LABSPEC >1.030 (H) 06/06/2022 0830   PHURINE 5.5 06/06/2022 0830   GLUCOSEU NEGATIVE 06/06/2022 0830   GLUCOSEU NEGATIVE 03/16/2022 1343   HGBUR TRACE (A) 06/06/2022 0830   BILIRUBINUR NEGATIVE 06/06/2022 0830   BILIRUBINUR ng 12/25/2013 1342   KETONESUR NEGATIVE 06/06/2022 0830   PROTEINUR 100 (A) 06/06/2022 0830   UROBILINOGEN 1.0 03/16/2022 1343   NITRITE NEGATIVE 06/06/2022 0830   LEUKOCYTESUR NEGATIVE 06/06/2022 0830    Radiological Exams on Admission: DG Chest 2 View  Result Date: 12/12/2022 CLINICAL DATA:  Cough, shortness of breath EXAM: CHEST - 2 VIEW COMPARISON:  11/21/2022 FINDINGS: Transverse diameter of heart is increased. Central pulmonary vessels are more prominent. There are no signs of alveolar pulmonary edema. Linear density in left parahilar region may suggest scarring or subsegmental atelectasis. There is no focal pulmonary consolidation. There is no pleural effusion or pneumothorax. Patient's chin is partially obscuring the right apex. Pacemaker battery is seen in the left infraclavicular region. There is a metallic clamp in the region of left atrial appendage. IMPRESSION: Cardiomegaly. Central pulmonary vessels are more prominent suggesting mild CHF. There is no focal pulmonary consolidation or signs of alveolar pulmonary edema. Electronically Signed   By: Ernie Avena M.D.   On: 12/12/2022 16:25    EKG: Independently reviewed.  Rate controlled A-fib, T wave abnormalities in lateral leads.  No significant change compared to prior tracings.  Assessment and Plan  Acute on chronic HFrEF Mild to moderate MR Echo done in October 2023 showing EF 20%, moderate concentric LVH, moderately reduced RV systolic function, moderately elevated PA systolic pressure, moderate biatrial enlargement, mild to moderate MR, and moderate TR. Patient is presenting with dyspnea and volume overload.  She has not increased the dose of torsemide as advised by cardiology during recent office  visit.  She is also not watching her fluid or sodium intake.  BNP elevated to the 700s.  Chest x-ray showing findings suggesting mild CHF; no overt pulmonary edema.  She is not hypoxic, satting in the mid 90s on room air.  She was placed on 2 L supplemental oxygen in the ED for comfort given mild tachypnea.  She has CKD stage 3a but creatinine stable.  Patient received IV Lasix 80 mg in the ED.  Continue diuresis with IV Lasix 80 mg twice daily starting in the morning.  Continue hydralazine, losartan, and spironolactone.  Patient is not taking Imdur and refuses as it causes her to have headaches.  Repeat echocardiogram ordered.  Monitor intake and output, daily weights.  Low-sodium diet with fluid restriction.  Consult cardiology in the morning.  Discussed CODE STATUS with the patient given her multiple cardiac comorbidities and she wishes to remain full code.  I have placed palliative care consult for goals of care discussion.  Hypertensive urgency Blood pressure 182/121 on arrival to the ED.  Remains elevated with systolic in the 180s.  Continue her home oral medications.  IV hydralazine PRN.  Elevated troponin CAD status post CABG Elevated troponin likely due to demand ischemia in the setting of decompensated heart failure.  Troponin elevated but stable (40> 42> 43) and not consistent with ACS.  EKG without acute  ischemic changes and patient is not endorsing chest pain.  Continue Lipitor.  Permanent A-fib Status post PPM Currently rate controlled.  Continue Xarelto.  CKD stage IIIa Creatinine 1.3, at baseline.  Continue to monitor renal function.  Hyperlipidemia Continue Lipitor.  COPD She has very mild scattered wheezing on exam.  Her dyspnea is mainly related to her decompensated heart failure.  Do not think steroids are indicated at this time.  Continue Breo plus Incruse Ellipta which is hospital formulary replacement for Trelegy.  Albuterol neb as needed.  GERD Continue  Protonix.  Gout Continue allopurinol.  Chronic thrombocytopenia Platelet count stable and no signs of bleeding.  Non-insulin-dependent type 2 diabetes Well-controlled-A1c 6.3 on labs done today.  CBG checks ACHS/ sensitive sliding scale insulin as needed.  Chronic pain/neuropathy Continue gabapentin.  DVT prophylaxis: Xarelto Code Status: Full Code (discussed with the patient) Family Communication: No family available at this time. Level of care: Progressive Care Unit Admission status: It is my clinical opinion that admission to INPATIENT is reasonable and necessary because of the expectation that this patient will require hospital care that crosses at least 2 midnights to treat this condition based on the medical complexity of the problems presented.  Given the aforementioned information, the predictability of an adverse outcome is felt to be significant.   John Giovanni MD [facility]  If 7PM-7AM, please contact night-coverage [email or web address]  12/12/2022, 9:39 PM

## 2022-12-13 ENCOUNTER — Telehealth (HOSPITAL_COMMUNITY): Payer: Self-pay | Admitting: Pharmacy Technician

## 2022-12-13 ENCOUNTER — Encounter: Payer: Self-pay | Admitting: Internal Medicine

## 2022-12-13 ENCOUNTER — Inpatient Hospital Stay (HOSPITAL_COMMUNITY): Payer: Medicare Other

## 2022-12-13 ENCOUNTER — Other Ambulatory Visit (HOSPITAL_COMMUNITY): Payer: Self-pay

## 2022-12-13 DIAGNOSIS — Z515 Encounter for palliative care: Secondary | ICD-10-CM

## 2022-12-13 DIAGNOSIS — Z7189 Other specified counseling: Secondary | ICD-10-CM

## 2022-12-13 DIAGNOSIS — I5023 Acute on chronic systolic (congestive) heart failure: Secondary | ICD-10-CM | POA: Diagnosis not present

## 2022-12-13 DIAGNOSIS — I5021 Acute systolic (congestive) heart failure: Secondary | ICD-10-CM

## 2022-12-13 LAB — GLUCOSE, CAPILLARY
Glucose-Capillary: 115 mg/dL — ABNORMAL HIGH (ref 70–99)
Glucose-Capillary: 103 mg/dL — ABNORMAL HIGH (ref 70–99)
Glucose-Capillary: 129 mg/dL — ABNORMAL HIGH (ref 70–99)
Glucose-Capillary: 142 mg/dL — ABNORMAL HIGH (ref 70–99)

## 2022-12-13 LAB — CBC
HCT: 45.6 % (ref 36.0–46.0)
Hemoglobin: 15.7 g/dL — ABNORMAL HIGH (ref 12.0–15.0)
MCH: 30.3 pg (ref 26.0–34.0)
MCHC: 34.4 g/dL (ref 30.0–36.0)
MCV: 88 fL (ref 80.0–100.0)
Platelets: 107 10*3/uL — ABNORMAL LOW (ref 150–400)
RBC: 5.18 MIL/uL — ABNORMAL HIGH (ref 3.87–5.11)
RDW: 17.9 % — ABNORMAL HIGH (ref 11.5–15.5)
WBC: 5.9 10*3/uL (ref 4.0–10.5)
nRBC: 0 % (ref 0.0–0.2)

## 2022-12-13 LAB — ECHOCARDIOGRAM COMPLETE
Area-P 1/2: 4.21 cm2
Calc EF: 27.3 %
Est EF: 25
Height: 67.5 in
MV M vel: 5.63 m/s
MV Peak grad: 126.6 mmHg
MV VTI: 0.25 cm2
S' Lateral: 4.7 cm
Single Plane A2C EF: 24.1 %
Single Plane A4C EF: 34 %
Weight: 2864 oz

## 2022-12-13 LAB — BASIC METABOLIC PANEL
Anion gap: 9 (ref 5–15)
BUN: 20 mg/dL (ref 8–23)
CO2: 20 mmol/L — ABNORMAL LOW (ref 22–32)
Calcium: 9.2 mg/dL (ref 8.9–10.3)
Chloride: 109 mmol/L (ref 98–111)
Creatinine, Ser: 1.22 mg/dL — ABNORMAL HIGH (ref 0.44–1.00)
GFR, Estimated: 45 mL/min — ABNORMAL LOW (ref >60)
Glucose, Bld: 112 mg/dL — ABNORMAL HIGH (ref 70–99)
Potassium: 3.5 mmol/L (ref 3.5–5.1)
Sodium: 138 mmol/L (ref 135–145)

## 2022-12-13 LAB — LACTIC ACID, PLASMA
Lactic Acid, Venous: 1.3 mmol/L (ref 0.5–1.9)
Lactic Acid, Venous: 1.6 mmol/L (ref 0.5–1.9)

## 2022-12-13 MED ORDER — HYDRALAZINE HCL 50 MG PO TABS
50.0000 mg | ORAL_TABLET | Freq: Three times a day (TID) | ORAL | Status: DC
  Administered 2022-12-13 – 2022-12-14 (×4): 50 mg via ORAL
  Filled 2022-12-13 (×4): qty 1

## 2022-12-13 MED ORDER — POTASSIUM CHLORIDE CRYS ER 20 MEQ PO TBCR
40.0000 meq | EXTENDED_RELEASE_TABLET | Freq: Once | ORAL | Status: AC
  Administered 2022-12-13: 40 meq via ORAL
  Filled 2022-12-13: qty 2

## 2022-12-13 NOTE — Telephone Encounter (Signed)
Pharmacy Patient Advocate Encounter  Insurance verification completed.    The patient is insured through [employer] Medicare Part D   The patient is currently admitted and ran test claims for the following: Farxiga, Jardiance, Entresto.  Copays and coinsurance results were relayed to Inpatient clinical team.  

## 2022-12-13 NOTE — Consult Note (Addendum)
Advanced Heart Failure Team Consult Note   Primary Physician: [name], MD PCP-Cardiologist:  Donato Schultz, MD HF Cardiologist: Dr. Gala Romney  Reason for Consultation: Acute on chronic systolic CHF  HPI:    [name] [name] is seen today for evaluation of acute on chronic systolic CHF at the request of Dr. Idelle Leech. 77 y.o. female with h/o HTN, former tobacco abuse, permanent AF s/p AV nodal ablation and Biotronix single lead PPM 06/22, CAD s/p CABG x 2 (LIMA to LAD and SVG to OM) in 2016, Chronic combined HF, Mixed ischemic/nonischemic CM, HLD, and Biotronik PPM, hx GI bleeding and iron deficiency anemia.    She was initially noted to be in atrial fibrillation in 2016 with a reduced LVEF, leading to ischemic workup with a cardiac cath showing severe multivessel CAD with LM dz -> CABG x2 utilizing LIMA to LAD, SVG to OM.   Echo 07/30/21: EF 25-30% Moderate RV dysfunction. Severe biatrial enlargement. Severe central MR/TR.    Was seen at [facility] in 4/23 for second opinion. Restarted Entresto 24/26 bid. Did not tolerate due to CP. Agreed she might candidate for mTEER down the road.    She was seen in the ED  June, July, August, and twice in September 2023 for chest pain/shortness of breath.    Multiple admissions over last couple of years for a/c CHF. Admitted 10/23 with a/c HF, worrisome for low output. Echo showed EF 20%, severe LV dysfunction, RV moderately down.  Diuresed with IV lasix and started on milrinone. RHC showed low volume and CI 1.9 on milrinone, improved with small IVF bolus. Drips weaned and GDMT added back. Referred back to [facility] to help with meds. Discharged home, weight 183 lbs.  EP attempted to upgrade her PPM to ICD on 04/08 but she was found to have occlusion of left subclavian vein and procedure was aborted. Saw Dr. Ladona Ridgel 04/25 and offered lead extraction and insertion of BiV ICD with removal of pacing lead and insertion of CS lead, left bundle area lead and  ICD lead. She was not interested. She appeared volume overloaded and Torsemide was increased.     She presented to the ED yesterday evening for evaluation of dyspnea and abnormal outpatient labs at PCP's office (ProBNP 764, HS troponin 42>43). CXR with evidence of CHF. On arrival to ED she was tachypneic and BP 182/121.   Scr at baseline 1.30, CO2 18, K 3.8, Na 141. She was given 80 mg lasix IV and admitted for a/c CHF.  Apparently had not increased her dose of diuretic (also reports missing doses) and was not restricting fluid or sodium intake.   Advanced Heart Failure asked to see to assist with management of a/c CHF. Has been diuresing well with IV lasix. Dyspnea starting to improve. Has declined allowing [facility] to fill her pill boxes and wants to manage meds on her own.   Review of Systems: [y] = yes, [ ]  = no   General: Weight gain [ ] ; Weight loss [ ] ; Anorexia [ ] ; Fatigue [ Y]; Fever [ ] ; Chills [ ] ; Weakness [ ]   Cardiac: Chest pain/pressure [ ] ; Resting SOB [ ] ; Exertional SOB [Y]; Orthopnea [ ] ; Pedal Edema [ ] ; Palpitations [ ] ; Syncope [ ] ; Presyncope [ ] ; Paroxysmal nocturnal dyspnea[ ]   Pulmonary: Cough [ ] ; Wheezing[ ] ; Hemoptysis[ ] ; Sputum [ ] ; Snoring [ ]   GI: Vomiting[ ] ; Dysphagia[ ] ; Melena[ ] ; Hematochezia [ ] ; Heartburn[ ] ; Abdominal pain [ ] ; Constipation [ ] ;  Diarrhea [ ] ; BRBPR [ ]   GU: Hematuria[ ] ; Dysuria [ ] ; Nocturia[ ]   Vascular: Pain in legs with walking [ ] ; Pain in feet with lying flat [ ] ; Non-healing sores [ ] ; Stroke [ ] ; TIA [ ] ; Slurred speech [ ] ;  Neuro: Headaches[Y]; Vertigo[ ] ; Seizures[ ] ; Paresthesias[ ] ;Blurred vision [ ] ; Diplopia [ ] ; Vision changes [ ]   Ortho/Skin: Arthritis [ ] ; Joint pain [ ] ; Muscle pain [ ] ; Joint swelling [ ] ; Back Pain [ ] ; Rash [ ]   Psych: Depression[ ] ; Anxiety[ ]   Heme: Bleeding problems [ ] ; Clotting disorders [ ] ; Anemia [ ]   Endocrine: Diabetes [ ] ; Thyroid dysfunction[ ]   Home Medications Prior to Admission  medications   Medication Sig Start Date End Date Taking? Authorizing Provider  acetaminophen (TYLENOL) 325 MG tablet Take 2 tablets (650 mg total) by mouth every 4 (four) hours as needed for headache or mild pain. 10/11/22  Yes [name], MD  acetaminophen (TYLENOL) 500 MG tablet Take 500 mg by mouth every 6 (six) hours as needed for moderate pain.   Yes [provider]  allopurinol (ZYLOPRIM) 100 MG tablet Take 100 mg by mouth daily. 07/19/21  Yes [provider]  atorvastatin (LIPITOR) 10 MG tablet Take 1 tablet (10 mg total) by mouth at bedtime. 10/11/22  Yes [name], MD  gabapentin (NEURONTIN) 100 MG capsule Take 1 capsule (100 mg total) by mouth at bedtime. 11/23/22  Yes Vivi Barrack, DPM  hydrALAZINE (APRESOLINE) 25 MG tablet Take 1 tablet (25 mg total) by mouth 3 (three) times daily. Patient taking differently: Take 25-50 mg by mouth See admin instructions. Take 2 tablets in the morning and 1 tablet at night 09/01/22 09/01/23 Yes [name], MD  losartan (COZAAR) 50 MG tablet Take 1 tablet (50 mg total) by mouth daily. 10/11/22  Yes [name], MD  metFORMIN (GLUCOPHAGE) 500 MG tablet TAKE 1 TABLET (500 MG TOTAL) BY MOUTH DAILY WITH BREAKFAST (MEAL) Patient taking differently: Take 500 mg by mouth daily with breakfast. 11/25/22  Yes [name], MD  Naphazoline HCl (CLEAR EYES OP) Place 1 drop into both eyes daily as needed (irritation).   Yes [provider]  pantoprazole (PROTONIX) 40 MG tablet Take 1 tablet (40 mg total) by mouth daily. Follow-up appt due in Marcht must see provider for future refills 09/27/22  Yes [name], MD  Rivaroxaban (XARELTO) 15 MG TABS tablet Take 1 tablet (15 mg total) by mouth daily with supper. NDC # [id number] Lot #  [id number] Exp- 09/16/23 11/30/22  Yes [name], MD  spironolactone (ALDACTONE) 25 MG tablet Take 1 tablet (25 mg total) by mouth daily. 10/11/22  Yes [name], MD  torsemide  (DEMADEX) 20 MG tablet Take 1.5 tablets (30 mg total) by mouth daily. Patient taking differently: Take 20 mg by mouth daily. 12/08/22  Yes [name], MD  TRELEGY ELLIPTA 100-62.5-25 MCG/ACT AEPB Inhale ONE PUFF into THE lungs daily Patient taking differently: Inhale 1 puff into the lungs daily as needed (for shortness of breath). 06/27/22  Yes [name], MD  albuterol (VENTOLIN HFA) 108 (90 Base) MCG/ACT inhaler Inhale 1-2 puffs into the lungs every 6 (six) hours as needed for wheezing or shortness of breath. Patient not taking: Reported on 12/12/2022 03/20/22   Rising, Lurena Joiner, PA-C  isosorbide mononitrate (IMDUR) 30 MG 24 hr tablet Take 1 tablet (30 mg total) by mouth daily. Patient not  taking: Reported on 12/12/2022 11/01/22 11/01/23  Laurey Morale, MD  sucralfate (CARAFATE) 1 GM/10ML suspension Take 10 mLs (1 g total) by mouth 4 (four) times daily -  with meals and at bedtime. Patient not taking: Reported on 12/12/2022 09/06/22   Dione Booze, MD    Past Medical History: Past Medical History:  Diagnosis Date   (HFimpEF) heart failure with improved ejection fraction (HCC)    a. 07/2018 Echo: EF 30-35%; b. 11/2019 Echo: EF 55-60%, no rwma, Gr2 DD, Nl RV size/fxn. Mild BAE. Mild MR/AI.   Arthritis    Asthma    Atopic dermatitis    CAD (coronary artery disease)    a. 2016 s/p CABG x 2 (LIMA->LAD, VG->OM); b. 08/2018 MV: EF 44%, no ischemia/infact.   Cardiac arrest (HCC) 10/2014   Cardiomyopathy, ischemic    a. 07/2018 Echo: EF 30-35%; 11/2019 Echo: EF 55-60%.   Carotid arterial disease (HCC)    a. 11/2019 Carotid U/S   CHF (congestive heart failure) (HCC)    CKD (chronic kidney disease), stage III (HCC)    COPD (chronic obstructive pulmonary disease) (HCC)    Diabetes mellitus without complication (HCC)    Dysrhythmia    Esophageal dilatation 2013   GERD (gastroesophageal reflux disease)    Glaucoma    Gout    Headache    Hypertension    Hypokalemia    Idiopathic angioedema     LGI bleed 08/06/2017   a. felt to be hemorrhoidal during that admission (no drop in Hgb).   Lower back pain    Paroxysmal atrial fibrillation (HCC)    a. Dx 2016-->h/o difficult to control rates (complicated by noncompliance), not felt to be a candidate for ablation or antiarrhythmic due to noncompliance; b. Recurrent AF 2021 - converted w/ IV dilt; c. CHA2DS2VASc = 7-->Xarelto.   Personal history of noncompliance with medical treatment, presenting hazards to health    Prediabetes    Presence of permanent cardiac pacemaker    S/P CABG x 2 with clipping of LA appendage 11/14/2014   LIMA to LAD, SVG to OM, EVH via right thigh   Urine incontinence    Uterine fibroid     Past Surgical History: Past Surgical History:  Procedure Laterality Date   AV NODE ABLATION N/A 01/18/2021   Procedure: AV NODE ABLATION;  Surgeon: [name], MD;  Location: [facility] INVASIVE CV LAB;  Service: Cardiovascular;  Laterality: N/A;   BALLOON DILATION N/A 10/10/2019   Procedure: BALLOON DILATION;  Surgeon: Kathi Der, MD;  Location: [facility] ENDOSCOPY;  Service: Gastroenterology;  Laterality: N/A;   BIOPSY  10/10/2019   Procedure: BIOPSY;  Surgeon: Kathi Der, MD;  Location: [facility] ENDOSCOPY;  Service: Gastroenterology;;   BIOPSY  10/11/2019   Procedure: BIOPSY;  Surgeon: Kathi Der, MD;  Location: [facility] ENDOSCOPY;  Service: Gastroenterology;;   Earnstine Regal N/A 11/21/2022   Procedure: BIVI  ICD UPGRADE;  Surgeon: [name], MD;  Location[facility] INVASIVE CV LAB;  Service: Cardiovascular;  Laterality: N/A;   CARDIOVERSION N/A 11/18/2014   Procedure: CARDIOVERSION;  Surgeon: Chrystie Nose, MD;  Location: [facility] OR;  Service: Cardiovascular;  Laterality: N/A;   CARDIOVERSION N/A 12/25/2017   Procedure: CARDIOVERSION;  Surgeon: Dolores Patty, MD;  Location: [facility] ENDOSCOPY;  Service: Cardiovascular;  Laterality: N/A;   CLIPPING OF ATRIAL APPENDAGE N/A 11/14/2014   Procedure: CLIPPING OF ATRIAL  APPENDAGE;  Surgeon: Purcell Nails, MD;  Location: [facility] OR;  Service: Open Heart Surgery;  Laterality: N/A;  COLONOSCOPY  2013   COLONOSCOPY WITH PROPOFOL N/A 10/11/2019   Procedure: COLONOSCOPY WITH PROPOFOL;  Surgeon: Kathi Der, MD;  Location: [facility] ENDOSCOPY;  Service: Gastroenterology;  Laterality: N/A;   CORONARY ARTERY BYPASS GRAFT N/A 11/14/2014   Procedure: CORONARY ARTERY BYPASS GRAFTING (CABG)TIMES 2 USING LEFT INTERNAL MAMMARY ARTERY AND RIGHT SAPHENOUS VEIN HARVESTED ENDOSCOPICALLY;  Surgeon: Purcell Nails, MD;  Location: [facility] OR;  Service: Open Heart Surgery;  Laterality: N/A;   ESOPHAGOGASTRODUODENOSCOPY (EGD) WITH PROPOFOL N/A 10/10/2019   Procedure: ESOPHAGOGASTRODUODENOSCOPY (EGD) WITH PROPOFOL;  Surgeon: Kathi Der, MD;  Location: [facility] ENDOSCOPY;  Service: Gastroenterology;  Laterality: N/A;   ESOPHAGOGASTRODUODENOSCOPY (EGD) WITH PROPOFOL N/A 10/27/2019   Procedure: ESOPHAGOGASTRODUODENOSCOPY (EGD) WITH PROPOFOL;  Surgeon: Kerin Salen, MD;  Location: [facility] ENDOSCOPY;  Service: Gastroenterology;  Laterality: N/A;   GIVENS CAPSULE STUDY N/A 10/27/2019   Procedure: GIVENS CAPSULE STUDY;  Surgeon: Kerin Salen, MD;  Location: [facility] ENDOSCOPY;  Service: Gastroenterology;  Laterality: N/A;   INSERT / REPLACE / REMOVE PACEMAKER     LEFT HEART CATHETERIZATION WITH CORONARY ANGIOGRAM N/A 11/04/2014   Procedure: LEFT HEART CATHETERIZATION WITH CORONARY ANGIOGRAM;  Surgeon: Lennette Bihari, MD;  Location: [facility] CATH LAB;  Service: Cardiovascular;  Laterality: N/A;   PACEMAKER IMPLANT N/A 01/18/2021   Procedure: PACEMAKER IMPLANT;  Surgeon: [name], MD;  Location: [facility] INVASIVE CV LAB;  Service: Cardiovascular;  Laterality: N/A;   PACEMAKER IMPLANT Left    POLYPECTOMY  10/11/2019   Procedure: POLYPECTOMY;  Surgeon: Kathi Der, MD;  Location: [facility] ENDOSCOPY;  Service: Gastroenterology;;   RIGHT HEART CATH N/A 06/08/2022   Procedure: RIGHT HEART CATH;  Surgeon: Dolores Patty, MD;  Location: [facility] INVASIVE CV LAB;  Service: Cardiovascular;  Laterality: N/A;   TEE WITHOUT CARDIOVERSION N/A 11/14/2014   Procedure: TRANSESOPHAGEAL ECHOCARDIOGRAM (TEE);  Surgeon: Purcell Nails, MD;  Location: [facility] OR;  Service: Open Heart Surgery;  Laterality: N/A;   TEE WITHOUT CARDIOVERSION N/A 12/25/2017   Procedure: TRANSESOPHAGEAL ECHOCARDIOGRAM (TEE);  Surgeon: Dolores Patty, MD;  Location: [facility] ENDOSCOPY;  Service: Cardiovascular;  Laterality: N/A;   TEMPORARY PACEMAKER INSERTION  11/04/2014   Procedure: TEMPORARY PACEMAKER INSERTION;  Surgeon: Lennette Bihari, MD;  Location: [facility] CATH LAB;  Service: Cardiovascular;;   TOOTH EXTRACTION      Family History: Family History  Problem Relation Age of Onset   Cancer Mother        LYMPHOMA   Heart disease Father    Other Father        TB   CVA Sister    Prostate cancer Brother 25   Diabetes Brother     Social History: Social History   Socioeconomic History   Marital status: Widowed    Spouse name: Not on file   Number of children: 1   Years of education: 48   Highest education level: Not on file  Occupational History   Occupation: RETIRED [employer]  Tobacco Use   Smoking status: Former    Packs/day: 0.50    Years: 56.00    Additional pack years: 0.00    Total pack years: 28.00    Types: Cigarettes    Quit date: 11/13/2021    Years since quitting: 1.0   Smokeless tobacco: Never   Tobacco comments:    smoking 2 cigarettes/day as of 10/23/20  Vaping Use   Vaping Use: Never used  Substance and Sexual Activity   Alcohol use: No    Alcohol/week: 0.0 standard drinks of  alcohol   Drug use: No   Sexual activity: Not on file  Other Topics Concern   Not on file  Social History Narrative   Patient reports it being difficult to pay for everything due to outstanding medical bills from hospital stay last year but states she is able to keep up with basic expenses.  Patient does have some concerns with her  house's condition due to a water leak- had her roof repaired last month but has some leaking in the front which has affected her porch.  Patient owns her own car and is able to drive herself but has some concerns about the reliability of her car- has gotten taxi to the clinic for appointment in the past when her car broke down.   Social Determinants of Health   Financial Resource Strain: Low Risk  (06/07/2018)   Overall Financial Resource Strain (CARDIA)    Difficulty of Paying Living Expenses: Not hard at all  Recent Concern: Financial Resource Strain - Medium Risk (05/21/2018)   Overall Financial Resource Strain (CARDIA)    Difficulty of Paying Living Expenses: Somewhat hard  Food Insecurity: No Food Insecurity (06/09/2022)   Hunger Vital Sign    Worried About Running Out of Food in the Last Year: Never true    Ran Out of Food in the Last Year: Never true  Transportation Needs: No Transportation Needs (06/09/2022)   PRAPARE - Administrator, Civil Service (Medical): No    Lack of Transportation (Non-Medical): No  Physical Activity: Not on file  Stress: Not on file  Social Connections: Not on file    Allergies:  Allergies  Allergen Reactions   Bee Venom Anaphylaxis   Ivp Dye [Iodinated Contrast Media] Anaphylaxis   Ace Inhibitors     angioedema   Atenolol     severe headaches   Codeine Nausea And Vomiting   Entresto [Sacubitril-Valsartan] Other (See Comments)    Chest pain    Isosorbide     Severe headaches   Shrimp [Shellfish Allergy] Swelling   Simvastatin     not sure   Jardiance [Empagliflozin] Other (See Comments)    Caused Boils    Penicillin G Rash    Objective:    Vital Signs:   Temp:  [98.1 F (36.7 C)-99.2 F (37.3 C)] 98.4 F (36.9 C) (04/30 0810) Pulse Rate:  [75-97] 75 (04/30 0810) Resp:  [16-29] 18 (04/30 0810) BP: (141-186)/(90-121) 178/96 (04/30 0810) SpO2:  [95 %-100 %] 97 % (04/30 0810) Weight:  [81.2 kg] 81.2 kg (04/30 0310)     Weight change: Filed Weights   12/12/22 2331 12/13/22 0310  Weight: 81.2 kg 81.2 kg    Intake/Output:   Intake/Output Summary (Last 24 hours) at 12/13/2022 1104 Last data filed at 12/13/2022 0923 Gross per 24 hour  Intake --  Output 3100 ml  Net -3100 ml      Physical Exam    General:  Elderly AAF lying comfortably in bed HEENT: normal Neck: supple. JVP 8-9 cm. Carotids 2+ bilat; no bruits.  Cor: PMI nondisplaced. Irregularly irregular rhythm. No rubs, gallops or murmurs. Lungs: clear Abdomen: soft, nontender, nondistended.  Extremities: no cyanosis, clubbing, rash, edema Neuro: alert & orientedx3. Affect pleasant   Telemetry   Afib 70s  EKG    Afib 91 bpm, ST-T wave abnormality inferolateral leads (not significantly changed from prior)  Labs   Basic Metabolic Panel: Recent Labs  Lab 12/12/22 2001 12/13/22 0226  NA 141 138  K  3.8 3.5  CL 112* 109  CO2 18* 20*  GLUCOSE 126* 112*  BUN 19 20  CREATININE 1.30* 1.22*  CALCIUM 9.2 9.2    Liver Function Tests: No results for input(s): "AST", "ALT", "ALKPHOS", "BILITOT", "PROT", "ALBUMIN" in the last 168 hours. No results for input(s): "LIPASE", "AMYLASE" in the last 168 hours. No results for input(s): "AMMONIA" in the last 168 hours.  CBC: Recent Labs  Lab 12/12/22 1656 12/12/22 2001 12/13/22 0226  WBC 6.2 6.3 5.9  NEUTROABS 4.5 4.5  --   HGB 15.3* 15.5* 15.7*  HCT 46.2* 47.5* 45.6  MCV 90.4 90.8 88.0  PLT 107.0* 119* 107*    Cardiac Enzymes: No results for input(s): "CKTOTAL", "CKMB", "CKMBINDEX", "TROPONINI" in the last 168 hours.  BNP: BNP (last 3 results) Recent Labs    06/06/22 0615 10/28/22 1506 12/12/22 2001  BNP 1,732.7* 559.3* 777.9*    ProBNP (last 3 results) Recent Labs    12/12/22 1656  PROBNP 764.0*     CBG: Recent Labs  Lab 12/12/22 2343 12/13/22 0833  GLUCAP 127* 115*    Coagulation Studies: No results for input(s): "LABPROT", "INR" in the last 72  hours.   Imaging   DG Chest 2 View  Result Date: 12/12/2022 CLINICAL DATA:  Cough, shortness of breath EXAM: CHEST - 2 VIEW COMPARISON:  11/21/2022 FINDINGS: Transverse diameter of heart is increased. Central pulmonary vessels are more prominent. There are no signs of alveolar pulmonary edema. Linear density in left parahilar region may suggest scarring or subsegmental atelectasis. There is no focal pulmonary consolidation. There is no pleural effusion or pneumothorax. Patient's chin is partially obscuring the right apex. Pacemaker battery is seen in the left infraclavicular region. There is a metallic clamp in the region of left atrial appendage. IMPRESSION: Cardiomegaly. Central pulmonary vessels are more prominent suggesting mild CHF. There is no focal pulmonary consolidation or signs of alveolar pulmonary edema. Electronically Signed   By: Ernie Avena M.D.   On: 12/12/2022 16:25     Medications:     Current Medications:  allopurinol  100 mg Oral Daily   atorvastatin  10 mg Oral QHS   fluticasone furoate-vilanterol  1 puff Inhalation Daily   And   umeclidinium bromide  1 puff Inhalation Daily   furosemide  80 mg Intravenous BID   gabapentin  100 mg Oral QHS   hydrALAZINE  25 mg Oral TID   insulin aspart  0-5 Units Subcutaneous QHS   insulin aspart  0-9 Units Subcutaneous TID WC   losartan  50 mg Oral Daily   pantoprazole  40 mg Oral Daily   Rivaroxaban  15 mg Oral Q supper   spironolactone  25 mg Oral Daily    Infusions:     Patient Profile   77 y.o. female with history of chronic systolic CHF, mixed ICM/NICM, permanent AF s/p AV nodal ablation and Biotronik dual chamber PPM, MR, CAD, COPD, CKD III, HTN.   Admitted with a/c CHF in setting of nonadherence with medical therapy.  Assessment/Plan    Acute on Chronic Systolic Heart Failure - Suspect Combined ICM /NICM with previous A fib RVR. RV Pacing? - In 2021 EF improved 55-60% but over the last year EF has been  down 25-30 % with severe MR/TR - Echo (10/23): EF 20%, moderate LVH, RV moderately reduced, RVSP 46 mmHg, moderate BAE, mild to moderate MR, moderate TR, - Upgrade PPM to ICD aborted earlier this month d/t occlusion of left subclavian vein. Dr.  Ladona Ridgel saw patient and offered lead extraction and insertion of BiV ICD. She declined. - Admitted with a/c CHF in setting of noncompliance with diuretics + fluid/sodium restriction. - NYHA III on presentation - Volume improving on exam. Continue IV lasix 80 BID today, may be ready to transition to PO tomorrow. Consider ReDS before stopping IV diuretic. - Prior hx low-output HF. CO2 18 on presentation. Check lactic acid.  - Discussed better adherence with fluid/sodium intake. Followed by [facility]amed[facility] but not accepting assistance with managing medications. - Continue spironolactone 25 mg daily. - Continue losartan 50 mg daily (failed Entresto with chest pain.  - Increase hydralazine to 50 mg tid, refuses Imdur d/t headaches - No SGLT2i due UTI/yeast infections.  - Echo pending   2. MR, Previously Severe - Mild to moderate on echo 10/23 - Follow on echo   3. Permanent A fib  - Biotronik dual chamber PPM/AV nodal ablation 2022.  - See above regarding device - Rate controlled.  - Continue Xarelto 15 mg daily (dose adjusted for CrCl)   4. CAD - s/p CABG x2 2016 (LIMA to LAD and SVG to OM)  - HS troponin mildly elevated 40>42>43. Likely d/t demand ischemia. Doubt ACS. - Continue statin. No aspirin given anticoagulation.   5. COPD - Continues to smoke 2 cigarettes daily   6. Hx PVCs - During prior admit while on milrinone. - Follow on telemetry   7. CKD III - Baseline SCr 1.2-1.4 - Scr 1.22 today - Follow renal function with diuresis   8. Hypertension - BP uncontrolled.  - Compliance with home medications uncertain  Length of Stay: 1  FINCH, LINDSAY N, PA-C  12/13/2022, 11:04 AM  Advanced Heart Failure Team Pager [phone] (M-F; 7a -  5p)  Please contact [facility] for night-coverage after hours (4p -7a ) and weekends on [email or web address]   Patient seen and examined with the above-signed Advanced Practice Provider and/or Housestaff. I personally reviewed laboratory data, imaging studies and relevant notes. I independently examined the patient and formulated the important aspects of the plan. I have edited the note to reflect any of my changes or salient points. I have personally discussed the plan with the patient and/or family.  77 y/o woman (today is her bday!) with systolic HF due to mixed iCM/NICM admitted with HF flare.   Recently saw Dr. Ladona Ridgel regarding possible upgrade to CRT but did not want to proceed. Also has refused to participate with Paramed[facility].   Reports several day h/o increasing SOB and edema. Came to ER and found to have pulmonary edema.   Has received IV lasix with improvement in symptoms but still with some volume on board  General:  Sitting up in bed No resp difficulty HEENT: normal Neck: supple. JVP 8. Carotids 2+ bilat; no bruits. No lymphadenopathy or thryomegaly appreciated. Cor: PMI nondisplaced. Regular rate & rhythm. No rubs, gallops or murmurs. Lungs: clear Abdomen: soft, nontender, nondistended. No hepatosplenomegaly. No bruits or masses. Good bowel sounds. Extremities: no cyanosis, clubbing, rash, 1+ edema Neuro: alert & orientedx3, cranial nerves grossly intact. moves all 4 extremities w/o difficulty. Affect pleasant  She remains volume overloaded. Continue IV diuresis. I think she would benefit significantly from upgrade to CRT but she has refused and technically complicated due to left subclavian vein occlusion.   Will repeat echo.   Arvilla Meres, MD  5:54 PM

## 2022-12-13 NOTE — Progress Notes (Signed)
Heart Failure Navigator Progress Note  Assessed for Heart & Vascular TOC clinic readiness.  Patient does not meet criteria due to Advanced Heart Failure Team patient.   Navigator will sign off at this time.    Lionardo Haze, BSN, RN Heart Failure Nurse Navigator Secure Chat Only   

## 2022-12-13 NOTE — Progress Notes (Signed)
PROGRESS NOTE    [name] [name]  WGN:[id number] DOB: 1946-04-26 DOA: 12/12/2022 PCP: [name], MD   Brief Narrative:  This 77 years old female with PMH significant for CAD status post CABG x 2 in 2016, history of cardiac arrest, permanent A-fib on Xarelto, status post PPM, chronic HFrEF, moderate to severe MR, moderate TR, CKD stage 3a, hypertension, hyperlipidemia, idiopathic angioedema, type 2 diabetes, carotid arterial disease, COPD, GERD, gout, thrombocytopenia scented in the ED with worsening shortness of breath.  Patient was recently seen by cardiology on 12/08/2022 for shortness of breath and advised to increase the dose of torsemide to 30 mg daily.  She has not increased her torsemide and was seen by PCP today and was sent to the ED for further evaluation.  She was found to have BNP 764, troponin 40.  EKG without ischemic changes.  Chest x-ray show cardiomegaly with findings suggestive of mild CHF.  Patient is admitted for further evaluation.  Cardiology is consulted.  Assessment & Plan:   Principal Problem:   Acute on chronic HFrEF (heart failure with reduced ejection fraction) (HCC) Active Problems:   Coronary artery disease due to lipid rich plaque   Mitral regurgitation   COPD (chronic obstructive pulmonary disease) (HCC)   Controlled type 2 diabetes mellitus without complication, without long-term current use of insulin (HCC)   Thrombocytopenia (HCC)   CKD (chronic kidney disease), stage III (HCC)   Hypertensive urgency  Acute on chronic systolic CHF: Mild to moderate MR: Patient presented with worsening shortness of breath, Elevated BNP and cardiomegaly on chest x-ray. Echo in 10/23 showing LVEF 20%, moderate concentric LVH, moderately reduced RV systolic function. She has not increased the dose of torsemide as advised by cardiology during recent office visit.   She is also not watching her fluid or sodium intake.  She was placed on 2 L supplemental oxygen for comfort  given tachypnea otherwise satting 90% on RA. Continue IV Lasix 80 mg every 12 hours. Monitor daily weight, intake output charting. Patient has CKD stage IIIa, monitor serum creatinine while on Lasix.   Continue hydralazine, losartan, and spironolactone.  Patient is not taking Imdur and refuses as it causes her to have headaches.   Obtain 2D echocardiogram.   Cardiology is consulted, awaiting recommendation.  Hypertensive urgency: Blood pressure 182/121 on arrival to the ED.   Continue hydralazine, losartan, spironolactone. Continue IV hydralazine as needed.  Blood pressure has improved.   Elevated troponin: CAD status post CABG Elevated troponin likely due to demand ischemia in the setting of decompensated heart failure.   Troponin elevated but stable (40> 42> 43) and not consistent with ACS.   EKG without acute ischemic changes and patient is not endorsing chest pain.   Continue Lipitor.   Permanent A-fib: Status post PPM: HR well controlled.  Continue Xarelto.   CKD stage IIIa Creatinine 1.3, at baseline.   Continue to monitor renal function.   Hyperlipidemia Continue Lipitor.   COPD Her dyspnea is mainly related to her decompensated heart failure.   Do not think steroids are indicated at this time.   Continue Breo plus Incruse Ellipta which is hospital formulary replacement for Trelegy.   Albuterol neb as needed.   GERD Continue Protonix.   Gout Continue allopurinol.   Chronic thrombocytopenia Platelet count stable and no signs of bleeding.   Non-insulin-dependent type 2 diabetes Well-controlled-A1c 6.3 on labs  CBG checks ACHS/ sensitive sliding scale insulin as needed.   Chronic pain/neuropathy Continue gabapentin.  DVT prophylaxis:  Xarelto Code Status: Full code Family Communication: No family at bed side. Disposition Plan:    Status is: Inpatient Remains inpatient appropriate because: Patient admitted for worsening shortness of breath, elevated BNP  and chest x-ray showing cardiomegaly in the setting of EF less than 20%.  Admitted for acute systolic CHF cardiology is consulted.   Consultants:  Cardiology  Procedures: Echocardiogram Antimicrobials: None  Subjective: Patient was seen and examined at bedside.  Overnight events noted. Patient reports doing much better.  States she has her birthday today and she wants to be discharged. She states she can take Lasix at home.  Cardiology evaluation is pending  Objective: Vitals:   12/12/22 2330 12/12/22 2331 12/13/22 0310 12/13/22 0810  BP:  (!) 168/107 (!) 141/116 (!) 178/96  Pulse:  79 93 75  Resp:  18 18 18   Temp:  98.1 F (36.7 C) 98.4 F (36.9 C) 98.4 F (36.9 C)  TempSrc:  Oral Oral Oral  SpO2:  100% 96% 97%  Weight:  81.2 kg 81.2 kg   Height: 5' 7.5" (1.715 m)       Intake/Output Summary (Last 24 hours) at 12/13/2022 1159 Last data filed at 12/13/2022 1610 Gross per 24 hour  Intake --  Output 3100 ml  Net -3100 ml   Filed Weights   12/12/22 2331 12/13/22 0310  Weight: 81.2 kg 81.2 kg    Examination:  General exam: Appears calm and comfortable, deconditioned, not in any distress. Respiratory system: Clear to auscultation. Respiratory effort normal.  RR 13 Cardiovascular system: S1 & S2 heard, RRR. No JVD, murmurs, rubs, gallops or clicks. Gastrointestinal system: Abdomen is soft, non tender, non distended, BS+ Central nervous system: Alert and oriented x3 . No focal neurological deficits. Extremities: Edema+, no cyanosis, no clubbing  Skin: No rashes, lesions or ulcers Psychiatry: Judgement and insight appear normal. Mood & affect appropriate.     Data Reviewed: I have personally reviewed following labs and imaging studies  CBC: Recent Labs  Lab 12/12/22 1656 12/12/22 2001 12/13/22 0226  WBC 6.2 6.3 5.9  NEUTROABS 4.5 4.5  --   HGB 15.3* 15.5* 15.7*  HCT 46.2* 47.5* 45.6  MCV 90.4 90.8 88.0  PLT 107.0* 119* 107*   Basic Metabolic Panel: Recent  Labs  Lab 12/12/22 2001 12/13/22 0226  NA 141 138  K 3.8 3.5  CL 112* 109  CO2 18* 20*  GLUCOSE 126* 112*  BUN 19 20  CREATININE 1.30* 1.22*  CALCIUM 9.2 9.2   GFR: Estimated Creatinine Clearance: 42.1 mL/min (A) (by C-G formula based on SCr of 1.22 mg/dL (H)). Liver Function Tests: No results for input(s): "AST", "ALT", "ALKPHOS", "BILITOT", "PROT", "ALBUMIN" in the last 168 hours. No results for input(s): "LIPASE", "AMYLASE" in the last 168 hours. No results for input(s): "AMMONIA" in the last 168 hours. Coagulation Profile: No results for input(s): "INR", "PROTIME" in the last 168 hours. Cardiac Enzymes: No results for input(s): "CKTOTAL", "CKMB", "CKMBINDEX", "TROPONINI" in the last 168 hours. BNP (last 3 results) Recent Labs    12/12/22 1656  PROBNP 764.0*   HbA1C: Recent Labs    12/12/22 1656  HGBA1C 6.3   CBG: Recent Labs  Lab 12/12/22 2343 12/13/22 0833  GLUCAP 127* 115*   Lipid Profile: No results for input(s): "CHOL", "HDL", "LDLCALC", "TRIG", "CHOLHDL", "LDLDIRECT" in the last 72 hours. Thyroid Function Tests: No results for input(s): "TSH", "T4TOTAL", "FREET4", "T3FREE", "THYROIDAB" in the last 72 hours. Anemia Panel: No results for input(s): "  VITAMINB12", "FOLATE", "FERRITIN", "TIBC", "IRON", "RETICCTPCT" in the last 72 hours. Sepsis Labs: No results for input(s): "PROCALCITON", "LATICACIDVEN" in the last 168 hours.  No results found for this or any previous visit (from the past 240 hour(s)).   Radiology Studies: DG Chest 2 View  Result Date: 12/12/2022 CLINICAL DATA:  Cough, shortness of breath EXAM: CHEST - 2 VIEW COMPARISON:  11/21/2022 FINDINGS: Transverse diameter of heart is increased. Central pulmonary vessels are more prominent. There are no signs of alveolar pulmonary edema. Linear density in left parahilar region may suggest scarring or subsegmental atelectasis. There is no focal pulmonary consolidation. There is no pleural effusion or  pneumothorax. Patient's chin is partially obscuring the right apex. Pacemaker battery is seen in the left infraclavicular region. There is a metallic clamp in the region of left atrial appendage. IMPRESSION: Cardiomegaly. Central pulmonary vessels are more prominent suggesting mild CHF. There is no focal pulmonary consolidation or signs of alveolar pulmonary edema. Electronically Signed   By: Ernie Avena M.D.   On: 12/12/2022 16:25    Scheduled Meds:  allopurinol  100 mg Oral Daily   atorvastatin  10 mg Oral QHS   fluticasone furoate-vilanterol  1 puff Inhalation Daily   And   umeclidinium bromide  1 puff Inhalation Daily   furosemide  80 mg Intravenous BID   gabapentin  100 mg Oral QHS   hydrALAZINE  25 mg Oral TID   insulin aspart  0-5 Units Subcutaneous QHS   insulin aspart  0-9 Units Subcutaneous TID WC   losartan  50 mg Oral Daily   pantoprazole  40 mg Oral Daily   potassium chloride  40 mEq Oral Once   Rivaroxaban  15 mg Oral Q supper   spironolactone  25 mg Oral Daily   Continuous Infusions:   LOS: 1 day    Time spent: 50 mins    Willeen Niece, MD [facility]   If 7PM-7AM, please contact night-coverage

## 2022-12-13 NOTE — TOC Initial Note (Signed)
Transition of Care [facility]) - Initial/Assessment Note    Patient Details  Name: [name] [name] MRN: [id number] Date of Birth: 09/22/1945  Transition of [facility]) CM/SW Contact:    Gala Lewandowsky, RN Phone Number: 12/13/2022, 10:03 AM  Clinical Narrative: Patient presented for shortness of breath. PTA patient was from home with a family member. Patient states Watt Climes takes her to appointments and helps with groceries. Patient continues on IV Lasix. Patient reports that she has a cane and scale in the home. Case Manager will continue to follow for transition of care needs as she progresses.                Expected Discharge Plan: Home/Self Care Barriers to Discharge: Continued Medical Work up   Patient Goals and CMS Choice Patient states their goals for this hospitalization and ongoing recovery are:: to return home.   Expected Discharge Plan and Services In-house Referral: NA Discharge Planning Services: CM Consult   Living arrangements for the past 2 months: Single Family Home                   DME Agency: NA    Prior Living Arrangements/Services Living arrangements for the past 2 months: Single Family Home Lives with:: Relatives Patient language and need for interpreter reviewed:: Yes Do you feel safe going back to the place where you live?: Yes      Need for Family Participation in Patient Care: Yes (Comment) Care giver support system in place?: Yes (comment) Current home services: DME (scale and cane at home.) Criminal Activity/Legal Involvement Pertinent to Current Situation/Hospitalization: No - Comment as needed  Activities of Daily Living      Permission Sought/Granted Permission sought to share information with : Family Supports, Case Manager   Emotional Assessment Appearance:: Appears stated age Attitude/Demeanor/Rapport: Engaged Affect (typically observed): Appropriate Orientation: : Oriented to Self, Oriented to Place, Oriented to  Time, Oriented  to Situation Alcohol / Substance Use: Not Applicable Psych Involvement: No (comment)  Admission diagnosis:  Acute on chronic HFrEF (heart failure with reduced ejection fraction) (HCC) [I50.23] Acute on chronic congestive heart failure, unspecified heart failure type (HCC) [I50.9] Patient Active Problem List   Diagnosis Date Noted   Acute cough 12/12/2022   SOB (shortness of breath) 12/12/2022   Acute on chronic HFrEF (heart failure with reduced ejection fraction) (HCC) 12/12/2022   Ceruminosis, bilateral 06/17/2022   Flu vaccine need 06/17/2022   Chronic combined systolic and diastolic CHF (congestive heart failure) (HCC) 06/17/2022   Encounter for general adult medical examination with abnormal findings 06/16/2022   Chronic anticoagulation 06/06/2022   Mitral regurgitation 06/06/2022   Controlled type 2 diabetes mellitus without complication, without long-term current use of insulin (HCC) 06/06/2022   Pacemaker 02/22/2022   Hypertensive urgency 06/08/2021   Obstructive sleep apnea 09/21/2020   Obesity (BMI 30.0-34.9) 02/10/2020   COPD (chronic obstructive pulmonary disease) (HCC) 10/26/2019   Thrombocytopenia (HCC) 09/14/2017   Medication noncompliance due to cognitive impairment 09/06/2017   Persistent atrial fibrillation (HCC) 08/04/2017   Cardiomyopathy, ischemic 02/08/2017   CKD (chronic kidney disease), stage III (HCC) 01/30/2017   Coronary artery disease due to lipid rich plaque    Essential hypertension    PCP:  [name], MD Pharmacy:   [facility] - [place], [place] - [address] [place] [address] Phone: [phone] Fax: [phone]  [id number] - [place], [place] - [address] AT  [facility] OF [address] [place] [address] Phone: [phone] Fax: [phone]  Social Determinants of Health (SDOH) Social History: SDOH Screenings   Food Insecurity: No Food  Insecurity (06/09/2022)  Housing: Low Risk  (06/09/2022)  Transportation Needs: No Transportation Needs (06/09/2022)  Utilities: Not At Risk (06/09/2022)  Depression (PHQ2-9): Low Risk  (03/16/2022)  Financial Resource Strain: Low Risk  (06/07/2018)  Recent Concern: Financial Resource Strain - Medium Risk (05/21/2018)  Tobacco Use: Medium Risk (12/12/2022)   Readmission Risk Interventions    02/12/2021   10:29 AM 12/09/2020   12:39 PM  Readmission Risk Prevention Plan  Transportation Screening Complete Complete  PCP or Specialist Appt within 3-5 Days Complete Complete  HRI or Home Care Consult Complete Complete  Social Work Consult for Recovery Care Planning/Counseling Complete Complete  Palliative Care Screening Not Applicable Complete  Medication Review Oceanographer) Complete Complete

## 2022-12-13 NOTE — TOC Benefit Eligibility Note (Signed)
Patient Product/process development scientist completed.    The patient is currently admitted and upon discharge could be taking Entresto 24-26 mg.  The current 30 day co-pay is $47.00.   The patient is currently admitted and upon discharge could be taking Farxiga 10 mg.  The current 30 day co-pay is $47.00.   The patient is currently admitted and upon discharge could be taking Jardiance 10 mg.  The current 30 day co-pay is $47.00.   The patient is insured through [employer] Part D   This test claim was processed through [facility]- copay amounts may vary at other pharmacies due to pharmacy/plan contracts, or as the patient moves through the different stages of their insurance plan.  Roland Earl, CPHT Pharmacy Patient Advocate Specialist [facility] Patient Advocate Team Direct Number: [phone]  Fax: [phone]

## 2022-12-13 NOTE — Progress Notes (Signed)
  Echocardiogram 2D Echocardiogram has been performed.  [name] [name] 12/13/2022, 5:18 PM

## 2022-12-13 NOTE — Consult Note (Signed)
Palliative Care Consult Note                                  Date: 12/13/2022   Patient Name: [name] [name]  DOB: Jan 19, 1946  MRN: [id number]  Age / Sex: 77 y.o., female  PCP: [name], MD Referring Physician: Willeen Niece, MD  Reason for Consultation: Establishing goals of care  HPI/Patient Profile: 77 y.o. female  with past medical history of chronic HFrEF, CAD status post CABG x 2 in 2016, permanent atrial fibrillation on Xarelto, status post PPM, moderate to severe MR, moderate TR, CKD stage 3a, HTN, HLD, T2DM, COPD, and gout. She presented to [facility] ED on 12/12/2022 with worsening shortness of breath. She was found to have BNP 764, troponin 40. Chest x-ray showed cardiomegaly with findings suggestive of mild CHF.   Palliative Medicine was consulted for goals of care.    Subjective:   I have reviewed medical records including progress notes, labs and imaging, and met with patient at bedside to discuss diagnosis, prognosis, and GOC.  Patient is sitting on the side of the bed eating her dinner tray. She denies any acute complaints and states she is hoping to go home tomorrow. I wished her a happy birthday.   I introduced Palliative Medicine as specialized medical care for people living with serious illness. It focuses on providing relief from the symptoms and stress of a serious illness.   I offered to discuss patient's current illness in the context of her ongoing co-morbidities. She declines discussing her medical issues at this time.   Values and goals of care important to patient were attempted to be elicited. Her goal is for improvement and to go home.   A discussion was had today regarding advanced directives. Concepts specific to code status, artifical feeding and hydration, continued IV antibiotics and rehospitalization was had.  Patient states she would want her daughter/[name] [name] to make medical decisions on her behalf if  she was unable to make those decisions for herself.   There is a MOST form on file from 2021, in which patient outlined her wishes for the following treatment decisions:  Cardiopulmonary Resuscitation: Attempt Resuscitation (CPR)  Medical Interventions: Full Scope of Treatment: Use intubation, advanced airway interventions, mechanical ventilation, cardioversion as indicated, medical treatment, IV fluids, etc, also provide comfort measures. Transfer to the hospital if indicated  Antibiotics: Antibiotics if indicated  IV Fluids: IV fluids if indicated  Feeding Tube: Left Blank    Patient states she does not wish to make any changes to the MOST form at this time. Questions and concerns addressed.  _____________________________________________________________________  With patient's permission, I spoke with her daughter/[name] [name] by phone and summarized our discussion. I informed daughter that her mother's heart failure was quite advanced with low EF of 25% (she was not aware of this). Discussed that patient would want [name] [name] to be her medical decision maker if needed. I also encouraged [name] [name] to have discussions with her mother about her wishes.    Review of Systems  All other systems reviewed and are negative.   Objective:   Primary Diagnoses: Present on Admission:  Acute on chronic HFrEF (heart failure with reduced ejection fraction) (HCC)  Thrombocytopenia (HCC)  COPD (chronic obstructive pulmonary disease) (HCC)  Hypertensive urgency  (Resolved) Elevated troponin  CKD (chronic kidney disease), stage III (HCC)  Coronary artery disease due to lipid rich plaque   Physical Exam  Vitals reviewed.  Constitutional:      General: She is not in acute distress. Cardiovascular:     Comments: Paced rhythm Pulmonary:     Effort: Pulmonary effort is normal.  Neurological:     Mental Status: She is alert and oriented to person, place, and time.     Vital Signs:  BP 133/85 (BP Location:  Left Arm)   Pulse 74   Temp 97.8 F (36.6 C) (Oral)   Resp 20   Ht 5' 7.5" (1.715 m)   Wt 81.2 kg   SpO2 94%   BMI 27.62 kg/m   Palliative Assessment/Data: PPS 60%     Assessment & Plan:   SUMMARY OF RECOMMENDATIONS   Full code Full scope of treatment Goal of care - improvement and return home  Primary Decision Maker: PATIENT  Prognosis:  Unable to determine    Thank you for allowing [place] to participate in the care of [name] [name]    Signed by: Sherlean Foot, NP Palliative Medicine Team  Team Phone # [phone]  For individual providers, please see AMION

## 2022-12-14 ENCOUNTER — Other Ambulatory Visit (HOSPITAL_COMMUNITY): Payer: Self-pay

## 2022-12-14 DIAGNOSIS — E785 Hyperlipidemia, unspecified: Secondary | ICD-10-CM

## 2022-12-14 DIAGNOSIS — I5023 Acute on chronic systolic (congestive) heart failure: Secondary | ICD-10-CM | POA: Diagnosis not present

## 2022-12-14 DIAGNOSIS — E1169 Type 2 diabetes mellitus with other specified complication: Secondary | ICD-10-CM | POA: Diagnosis not present

## 2022-12-14 DIAGNOSIS — I4821 Permanent atrial fibrillation: Secondary | ICD-10-CM

## 2022-12-14 DIAGNOSIS — I160 Hypertensive urgency: Secondary | ICD-10-CM

## 2022-12-14 DIAGNOSIS — J449 Chronic obstructive pulmonary disease, unspecified: Secondary | ICD-10-CM

## 2022-12-14 DIAGNOSIS — N1831 Chronic kidney disease, stage 3a: Secondary | ICD-10-CM | POA: Diagnosis not present

## 2022-12-14 LAB — COMPREHENSIVE METABOLIC PANEL
ALT: 16 U/L (ref 0–44)
AST: 21 U/L (ref 15–41)
Albumin: 3.3 g/dL — ABNORMAL LOW (ref 3.5–5.0)
Alkaline Phosphatase: 120 U/L (ref 38–126)
Anion gap: 14 (ref 5–15)
BUN: 28 mg/dL — ABNORMAL HIGH (ref 8–23)
CO2: 20 mmol/L — ABNORMAL LOW (ref 22–32)
Calcium: 9.3 mg/dL (ref 8.9–10.3)
Chloride: 105 mmol/L (ref 98–111)
Creatinine, Ser: 1.35 mg/dL — ABNORMAL HIGH (ref 0.44–1.00)
GFR, Estimated: 40 mL/min — ABNORMAL LOW (ref >60)
Glucose, Bld: 110 mg/dL — ABNORMAL HIGH (ref 70–99)
Potassium: 3.5 mmol/L (ref 3.5–5.1)
Sodium: 139 mmol/L (ref 135–145)
Total Bilirubin: 1.2 mg/dL (ref 0.3–1.2)
Total Protein: 7.1 g/dL (ref 6.5–8.1)

## 2022-12-14 LAB — GLUCOSE, CAPILLARY: Glucose-Capillary: 110 mg/dL — ABNORMAL HIGH (ref 70–99)

## 2022-12-14 MED ORDER — METOLAZONE 5 MG PO TABS
5.0000 mg | ORAL_TABLET | Freq: Once | ORAL | Status: AC
  Administered 2022-12-14: 5 mg via ORAL
  Filled 2022-12-14: qty 1

## 2022-12-14 MED ORDER — POTASSIUM CHLORIDE CRYS ER 20 MEQ PO TBCR
60.0000 meq | EXTENDED_RELEASE_TABLET | Freq: Once | ORAL | Status: AC
  Administered 2022-12-14: 60 meq via ORAL
  Filled 2022-12-14: qty 3

## 2022-12-14 MED ORDER — HYDRALAZINE HCL 25 MG PO TABS
50.0000 mg | ORAL_TABLET | Freq: Three times a day (TID) | ORAL | 1 refills | Status: DC
Start: 2022-12-14 — End: 2022-12-16
  Filled 2022-12-14: qty 180, 30d supply, fill #0

## 2022-12-14 MED ORDER — POTASSIUM CHLORIDE CRYS ER 20 MEQ PO TBCR
40.0000 meq | EXTENDED_RELEASE_TABLET | Freq: Once | ORAL | Status: AC
  Administered 2022-12-14: 40 meq via ORAL
  Filled 2022-12-14: qty 2

## 2022-12-14 MED ORDER — TORSEMIDE 20 MG PO TABS
40.0000 mg | ORAL_TABLET | Freq: Every day | ORAL | 1 refills | Status: DC
Start: 2022-12-14 — End: 2022-12-23
  Filled 2022-12-14: qty 60, 30d supply, fill #0

## 2022-12-14 NOTE — Assessment & Plan Note (Signed)
CKD stage 3b.   Renal function at the time of her discharge had a serum cr of 1,35 with K at 3,5 and serum bicarbonate at 20. Na is 139.  Plan to continue diuresis with torsemide, dose increased to 40 mg.  Plan to follow up renal function and electrolytes as outpatient.

## 2022-12-14 NOTE — Assessment & Plan Note (Signed)
Blood pressure improved, plan to continue with losartan and hydralazine.

## 2022-12-14 NOTE — Assessment & Plan Note (Signed)
Echocardiogram with reduced LV systolic function 25%, global hypokinesis, mild LVH. Moderate reduction in RV systolic function. RVSP 55,2 mmHg. LA and RA with severe dilatation, mild to moderate TR.   Patient was placed on IV furosemide and received po metolazone, negative fluid balance was achieved, -5,1100 ml, with significant improvement in her symptoms.   Patient will continue torsemide, losartan, spironolactone and hydralazine.

## 2022-12-14 NOTE — Progress Notes (Signed)
ReDS 36% this afternoon.   Okay for discharge today from HF perspective.  Discharge mes: Atorvastatin 10 mg daily Hydralazine 50 mg TID Losartan 50 mg daily Spiro 25 mg daily Xarelto 15 mg daily Torsemide 40 mg daily.   Will arrange f/u in HF clinic.

## 2022-12-14 NOTE — Progress Notes (Signed)
REDS Clip  READING= 36%  Thank you for allowing pharmacy to participate in this patient's care,  Sherron Monday, PharmD, BCCCP Clinical Pharmacist  Phone: [phone] 12/14/2022 1:21 PM  Please check AMION for all [facility] Pharmacy phone numbers After 10:00 PM, call Main Pharmacy [phone]

## 2022-12-14 NOTE — Assessment & Plan Note (Addendum)
Patient was placed on insulin therapy during her hospitalization.  Her fasting glucose on the day of discharge was 110 mg/dl.   Continue statin therapy.

## 2022-12-14 NOTE — Assessment & Plan Note (Signed)
No signs of exacerbation, continue bronchodilator therapy.

## 2022-12-14 NOTE — Progress Notes (Addendum)
Advanced Heart Failure Rounding Note  PCP-Cardiologist: Donato Schultz, MD   Subjective:    Continues on IV lasix 80 BID.  No change in weight but weighed in bed. Suspect Is/Os inaccurate.  Dyspnea improving. Wants to go home.  Echo this admit EF 25%, RV moderately reduced, RVSP 55 mmHg, severe BAE, moderate MR, dilated IVC with estimated RAP 15 mmHg   Objective:   Weight Range: 81.7 kg Body mass index is 27.79 kg/m.   Vital Signs:   Temp:  [97.5 F (36.4 C)-98.5 F (36.9 C)] 97.5 F (36.4 C) (05/01 0502) Pulse Rate:  [57-80] 80 (05/01 0817) Resp:  [18-20] 18 (05/01 0817) BP: (133-152)/(46-98) 136/46 (05/01 0502) SpO2:  [89 %-97 %] 92 % (05/01 0817) Weight:  [81.7 kg] 81.7 kg (05/01 0504)    Weight change: Filed Weights   12/12/22 2331 12/13/22 0310 12/14/22 0504  Weight: 81.2 kg 81.2 kg 81.7 kg    Intake/Output:   Intake/Output Summary (Last 24 hours) at 12/14/2022 0900 Last data filed at 12/14/2022 0600 Gross per 24 hour  Intake 240 ml  Output 2200 ml  Net -1960 ml      Physical Exam    General:  Well appearing. Sitting up in bed HEENT: Normal Neck: Supple. JVP 8-10. Carotids 2+ bilat; no bruits.  Cor: PMI nondisplaced. Regular rate & rhythm. No rubs, gallops or murmurs. Lungs: Clear Abdomen: Soft, nontender, nondistended.  Extremities: No cyanosis, clubbing, rash, 1+ edema Neuro: Alert & orientedx3. Affect pleasant   Telemetry   Afib 70s -80s, intermittently V paced, < 5 PVCs/min  Labs    CBC Recent Labs    12/12/22 1656 12/12/22 2001 12/13/22 0226  WBC 6.2 6.3 5.9  NEUTROABS 4.5 4.5  --   HGB 15.3* 15.5* 15.7*  HCT 46.2* 47.5* 45.6  MCV 90.4 90.8 88.0  PLT 107.0* 119* 107*   Basic Metabolic Panel Recent Labs    16/10/96 0226 12/14/22 0446  NA 138 139  K 3.5 3.5  CL 109 105  CO2 20* 20*  GLUCOSE 112* 110*  BUN 20 28*  CREATININE 1.22* 1.35*  CALCIUM 9.2 9.3   Liver Function Tests Recent Labs    12/14/22 0446  AST 21   ALT 16  ALKPHOS 120  BILITOT 1.2  PROT 7.1  ALBUMIN 3.3*   No results for input(s): "LIPASE", "AMYLASE" in the last 72 hours. Cardiac Enzymes No results for input(s): "CKTOTAL", "CKMB", "CKMBINDEX", "TROPONINI" in the last 72 hours.  BNP: BNP (last 3 results) Recent Labs    06/06/22 0615 10/28/22 1506 12/12/22 2001  BNP 1,732.7* 559.3* 777.9*    ProBNP (last 3 results) Recent Labs    12/12/22 1656  PROBNP 764.0*     D-Dimer Recent Labs    12/12/22 1656  DDIMER 0.43   Hemoglobin A1C Recent Labs    12/12/22 1656  HGBA1C 6.3   Fasting Lipid Panel No results for input(s): "CHOL", "HDL", "LDLCALC", "TRIG", "CHOLHDL", "LDLDIRECT" in the last 72 hours. Thyroid Function Tests No results for input(s): "TSH", "T4TOTAL", "T3FREE", "THYROIDAB" in the last 72 hours.  Invalid input(s): "FREET3"  Other results:   Imaging    ECHOCARDIOGRAM COMPLETE  Result Date: 12/13/2022    ECHOCARDIOGRAM REPORT   Patient Name:   [name] [name] Date of Exam: 12/13/2022 Medical Rec #:  [id number]     Height:       67.5 in Accession #:    [id number]    Weight:  179.0 lb Date of Birth:  September 04, 1945     BSA:          1.940 m Patient Age:    77 years      BP:           133/85 mmHg Patient Gender: F             HR:           54 bpm. Exam Location:  Inpatient Procedure: 2D Echo, Cardiac Doppler and Color Doppler Indications:    CHF-Acute Systolic I50.21  History:        Patient has prior history of Echocardiogram examinations. CHF                 and Cardiomyopathy, CAD, Prior CABG and Pacemaker, Carotid                 Disease, Mitral Valve Disease; Risk Factors:Hypertension,                 Diabetes and Former Smoker.  Sonographer:    Aron Baba Referring Phys: [id number] VASUNDHRA RATHORE  Sonographer Comments: Suboptimal parasternal window. Image acquisition challenging due to patient body habitus, Image acquisition challenging due to respiratory motion and Image acquisition challenging due to  COPD. IMPRESSIONS  1. Left ventricular ejection fraction, by estimation, is 25%. The left ventricle has severely decreased function. The left ventricle demonstrates global hypokinesis. There is mild concentric left ventricular hypertrophy. Left ventricular diastolic parameters are indeterminate.  2. Right ventricular systolic function is moderately reduced. The right ventricular size is is dilated. There is moderately elevated pulmonary artery systolic pressure. The estimated right ventricular systolic pressure is 55.2 mmHg.  3. Left atrial size was severely dilated.  4. Right atrial size was severely dilated.  5. The mitral valve is abnormal. Moderate mitral valve regurgitation.  6. Tricuspid valve regurgitation is mild to moderate.  7. The aortic valve is tricuspid. Aortic valve regurgitation is not visualized.  8. The inferior vena cava is dilated in size with <50% respiratory variability, suggesting right atrial pressure of 15 mmHg. Comparison(s): RVSP increased, RV dilation is better characterized. FINDINGS  Left Ventricle: Left ventricular ejection fraction, by estimation, is 25%. The left ventricle has severely decreased function. The left ventricle demonstrates global hypokinesis. The left ventricular internal cavity size was normal in size. There is mild concentric left ventricular hypertrophy. Left ventricular diastolic parameters are indeterminate. Right Ventricle: The right ventricular size is is dilated. No increase in right ventricular wall thickness. Right ventricular systolic function is moderately reduced. There is moderately elevated pulmonary artery systolic pressure. The tricuspid regurgitant velocity is 3.17 m/s, and with an assumed right atrial pressure of 15 mmHg, the estimated right ventricular systolic pressure is 55.2 mmHg. Left Atrium: Left atrial size was severely dilated. Right Atrium: Right atrial size was severely dilated. Pericardium: There is no evidence of pericardial effusion.  Mitral Valve: The mitral valve is abnormal. Moderate mitral valve regurgitation. MV peak gradient, 41.9 mmHg. The mean mitral valve gradient is 4.0 mmHg. Tricuspid Valve: The tricuspid valve is grossly normal. Tricuspid valve regurgitation is mild to moderate. No evidence of tricuspid stenosis. Aortic Valve: The aortic valve is tricuspid. There is mild aortic valve annular calcification. Aortic valve regurgitation is not visualized. Pulmonic Valve: The pulmonic valve was not well visualized. Pulmonic valve regurgitation is not visualized. Aorta: The aortic root and ascending aorta are structurally normal, with no evidence of dilitation. Venous: The inferior vena cava is dilated in size  with less than 50% respiratory variability, suggesting right atrial pressure of 15 mmHg. IAS/Shunts: No atrial level shunt detected by color flow Doppler. Additional Comments: A device lead is visualized in the right ventricle and right atrium.  LEFT VENTRICLE PLAX 2D LVIDd:         5.50 cm      Diastology LVIDs:         4.70 cm      LV e' medial:    2.61 cm/s LV PW:         1.10 cm      LV E/e' medial:  33.5 LV IVS:        1.20 cm      LV e' lateral:   4.88 cm/s LVOT diam:     2.00 cm      LV E/e' lateral: 17.9 LV SV:         24 LV SV Index:   12 LVOT Area:     3.14 cm  LV Volumes (MOD) LV vol d, MOD A2C: 130.5 ml LV vol d, MOD A4C: 149.0 ml LV vol s, MOD A2C: 99.1 ml LV vol s, MOD A4C: 98.4 ml LV SV MOD A2C:     31.4 ml LV SV MOD A4C:     149.0 ml LV SV MOD BP:      38.4 ml RIGHT VENTRICLE RV S prime:     5.22 cm/s TAPSE (M-mode): 1.2 cm LEFT ATRIUM              Index        RIGHT ATRIUM           Index LA diam:        5.00 cm  2.58 cm/m   RA Area:     35.70 cm LA Vol (A2C):   165.0 ml 85.06 ml/m  RA Volume:   141.00 ml 72.69 ml/m LA Vol (A4C):   128.0 ml 65.99 ml/m LA Biplane Vol: 149.0 ml 76.81 ml/m  AORTIC VALVE LVOT Vmax:   60.10 cm/s LVOT Vmean:  32.400 cm/s LVOT VTI:    0.077 m  AORTA Ao Root diam: 3.00 cm Ao Asc diam:   3.80 cm MITRAL VALVE               TRICUSPID VALVE MV Area (PHT): 4.21 cm    TR Peak grad:   40.2 mmHg MV Area VTI:   0.25 cm    TR Vmax:        317.00 cm/s MV Peak grad:  41.9 mmHg MV Mean grad:  4.0 mmHg    SHUNTS MV Vmax:       3.24 m/s    Systemic VTI:  0.08 m MV Vmean:      214.2 cm/s  Systemic Diam: 2.00 cm MV Decel Time: 180 msec MR Peak grad: 126.6 mmHg MR Mean grad: 67.0 mmHg MR Vmax:      562.50 cm/s MR Vmean:     373.0 cm/s MV E velocity: 87.40 cm/s MV A velocity: 21.30 cm/s MV E/A ratio:  4.10 Riley Lam MD Electronically signed by Riley Lam MD Signature Date/Time: 12/13/2022/5:36:03 PM    Final      Medications:     Scheduled Medications:  allopurinol  100 mg Oral Daily   atorvastatin  10 mg Oral QHS   fluticasone furoate-vilanterol  1 puff Inhalation Daily   And   umeclidinium bromide  1 puff Inhalation Daily   furosemide  80 mg Intravenous BID  gabapentin  100 mg Oral QHS   hydrALAZINE  50 mg Oral Q8H   insulin aspart  0-5 Units Subcutaneous QHS   insulin aspart  0-9 Units Subcutaneous TID WC   losartan  50 mg Oral Daily   pantoprazole  40 mg Oral Daily   Rivaroxaban  15 mg Oral Q supper   spironolactone  25 mg Oral Daily    Infusions:   PRN Medications: acetaminophen **OR** acetaminophen, albuterol, hydrALAZINE    Patient Profile   77 y.o. female with history of chronic systolic CHF, mixed ICM/NICM, permanent AF s/p AV nodal ablation and Biotronik dual chamber PPM, MR, CAD, COPD, CKD III, HTN.    Admitted with a/c CHF in setting of nonadherence with medical therapy.    Assessment/Plan   Acute on Chronic Systolic Heart Failure - Suspect Combined ICM /NICM with previous A fib RVR. RV Pacing? - In 2021 EF improved 55-60% but over the last year EF has been down 25-30 % with severe MR/TR - Echo (10/23): EF 20%, moderate LVH, RV moderately reduced, RVSP 46 mmHg, moderate BAE, mild to moderate MR, moderate TR, - Admitted with a/c CHF in  setting of noncompliance with diuretics + fluid/sodium restriction. - Echo this admit EF 25%, RV moderately reduced, RVSP 55 mmHg, severe BAE, moderate MR, dilated IVC with estimated RAP 15 mmHg - NYHA III on presentation - Remains on 80 mg lasix IV BID. Volume improving on exam but still appears to have some fluid. Really wants to go home today. Will give 80 mg IV lasix this am + 5 mg metolazone and check ReDS early afternoon to see if she needs additional diuresis. - Supp K - Prior hx low-output HF. Lactic acid okay.  - Discussed better adherence with fluid/sodium intake. Followed by Paramedicine but not accepting assistance with managing medications. - Continue spironolactone 25 mg daily. - Continue losartan 50 mg daily (failed Entresto with chest pain.  - Continue hydralazine 50 mg tid, refuses Imdur d/t headaches - No SGLT2i due UTI/yeast infections.  - No beta blocker with acute exacerbation and high burden of RV pacing on device checks (61% last check 03/24) - Would likely benfit from upgrade to CRT. Upgrade PPM to ICD aborted earlier this month d/t occlusion of left subclavian vein. Dr. Ladona Ridgel saw patient and offered lead extraction and insertion of CRT which she declined.   2. MR, Previously Severe - Moderate on echo this admit - Follow on echo   3. Permanent A fib  - Biotronik dual chamber PPM/AV nodal ablation 2022.  - See above regarding device - Rate controlled.  - Continue Xarelto 15 mg daily (dose adjusted for CrCl)   4. CAD - s/p CABG x2 2016 (LIMA to LAD and SVG to OM)  - HS troponin mildly elevated 40>42>43. Likely d/t demand ischemia. Doubt ACS. - Continue statin. No aspirin given anticoagulation.   5. COPD - Continues to smoke 2 cigarettes daily   6. Hx PVCs - During prior admit while on milrinone. - Follow on telemetry   7. CKD III - Baseline SCr 1.2-1.4 - Scr up slightly to 1.35 today - Follow renal function with diuresis   8. Hypertension - BP  improving with meds - Compliance at home uncertain  Length of Stay: 2  FINCH, LINDSAY N, PA-C  12/14/2022, 9:00 AM  Advanced Heart Failure Team Pager [phone] (M-F; 7a - 5p)  Please contact [facility] for night-coverage after hours (5p -7a ) and weekends on [email or web address]  Patient seen and examined with the above-signed Advanced Practice Provider and/or Housestaff. I personally reviewed laboratory data, imaging studies and relevant notes. I independently examined the patient and formulated the important aspects of the plan. I have edited the note to reflect any of my changes or salient points. I have personally discussed the plan with the patient and/or family.  Feeling much better with IV diuresis but weight actually up a pound. Denies SOB, orthopnea or PND. Wants to go home. SCr stable  General:  Sitting up in bed No resp difficulty HEENT: normal Neck: supple. JVP 8-9 Carotids 2+ bilat; no bruits. No lymphadenopathy or thryomegaly appreciated. Cor: PMI nondisplaced. Regular rate & rhythm. No rubs, gallops or murmurs. Lungs: clear Abdomen: soft, nontender, nondistended. No hepatosplenomegaly. No bruits or masses. Good bowel sounds. Extremities: no cyanosis, clubbing, rash, edema Neuro: alert & orientedx3, cranial nerves grossly intact. moves all 4 extremities w/o difficulty. Affect pleasant  Symptomatically improved but volume still seems up a bit. Wil give one dose IV lasix and metolazone today. Check ReDS this afternoon. If < 40% can go home with close f/u in HF Clinic.  Arvilla Meres, MD  11:49 AM

## 2022-12-14 NOTE — Discharge Summary (Addendum)
Physician Discharge Summary   Patient: [name] [name] MRN: [id number] DOB: February 17, 1946  Admit date:     12/12/2022  Discharge date: 12/14/22  Discharge Physician: York Ram Nene Aranas   PCP: [name], MD   Recommendations at discharge:    Torsemide has been increased to 40 mg daily. Increased hydralazine to 50 mg po tid.  Follow up renal function and electrolytes in 7 days as outpatient Follow up with Cardiology as scheduled.  Follow up with Dr Yetta Barre in 7 to 10 days.   Discharge Diagnoses: Principal Problem:   Acute on chronic systolic CHF (congestive heart failure) (HCC) Active Problems:   CKD (chronic kidney disease), stage III (HCC)   Persistent atrial fibrillation (HCC)   Hypertensive urgency   Type 2 diabetes mellitus with hyperlipidemia (HCC)   COPD (chronic obstructive pulmonary disease) (HCC)   Thrombocytopenia (HCC)  Resolved Problems:   Elevated troponin   Permanent atrial fibrillation [facility])  Hospital Course: [name] [name] was admitted to the hospital with the working diagnosis of heart failure decompensation.   77 yo female with the past medical history of coronary artery disease, history of cardiac arrest, atrial fibrillation, moderate to severe mitral regurgitation, T2DM, and COPD who presented with dyspnea. At home patient developed worsening dyspnea, cough and orthopnea. Outpatient follow up on 04/25 recommended to increase torsemide dose. On the day of hospitalization she was found volume overloaded and was referred to the ED. On her initial physical examination her blood pressure was 181/121, HR 88, RR 16 and 02 saturation 95%, heart with S1 and S2 present irregularly irregular, positive JVD, lungs with rales bilaterally, and scattered wheezing, abdomen with no distention and positive lower extremity edema.   Na 141, K 3,8 Cl 112, bicarbonate 18 glucose 126 bun 19 cr 1,30  BNP 77,9 High sensitive troponin 42 and 43  Wbc 6,3 hgb 15,5 plt 119   Chest  radiograph with cardiomegaly, bilateral hilar vascular congestion, cephalization of the vasculature. Small bilateral pleural effusions.   Pacemaker in place with one right ventricular lead.  Sternotomy wires in place.   EKG 91 bpm, normal axis, normal intervals, atrial fibrillation rhythm with ST depression and T wave inversion in V5 and V6.   Patient was placed on IV furosemide for diuresis.     Assessment and Plan: * Acute on chronic systolic CHF (congestive heart failure) (HCC) Echocardiogram with reduced LV systolic function 25%, global hypokinesis, mild LVH. Moderate reduction in RV systolic function. RVSP 55,2 mmHg. LA and RA with severe dilatation, mild to moderate TR.   Patient was placed on IV furosemide and received po metolazone, negative fluid balance was achieved, -5,1100 ml, with significant improvement in her symptoms.   Patient will continue torsemide, losartan, spironolactone and hydralazine.   CKD (chronic kidney disease), stage III (HCC) CKD stage 3b.   Renal function at the time of her discharge had a serum cr of 1,35 with K at 3,5 and serum bicarbonate at 20. Na is 139.  Plan to continue diuresis with torsemide, dose increased to 40 mg.  Plan to follow up renal function and electrolytes as outpatient.   Hypertensive urgency Blood pressure improved, plan to continue with losartan and hydralazine.   Persistent atrial fibrillation (HCC) Rate has been controlled.  Plan to continue blood pressure control and anticoagulation with rivaroxaban.   Type 2 diabetes mellitus with hyperlipidemia (HCC) Patient was placed on insulin therapy during her hospitalization.  Her fasting glucose on the day of discharge was 110 mg/dl.  Continue statin therapy.   COPD (chronic obstructive pulmonary disease) (HCC) No signs of exacerbation, continue bronchodilator therapy.   Thrombocytopenia (HCC) Follow up cell count is 107.         Consultants: cardiology   Procedures performed: none   Disposition: Home Diet recommendation:  Discharge Diet Orders (From admission, onward)     Start     Ordered   12/14/22 0000  Diet - low sodium heart healthy        12/14/22 1527           Cardiac and Carb modified diet DISCHARGE MEDICATION: Allergies as of 12/14/2022       Reactions   Bee Venom Anaphylaxis   Ivp Dye [iodinated Contrast Media] Anaphylaxis   Ace Inhibitors    angioedema   Atenolol    severe headaches   Codeine Nausea And Vomiting   Entresto [sacubitril-valsartan] Other (See Comments)   Chest pain    Isosorbide    Severe headaches   Shrimp [shellfish Allergy] Swelling   Simvastatin    not sure   Jardiance [empagliflozin] Other (See Comments)   Caused Boils   Penicillin G Rash        Medication List     STOP taking these medications    albuterol 108 (90 Base) MCG/ACT inhaler Commonly known as: VENTOLIN HFA   isosorbide mononitrate 30 MG 24 hr tablet Commonly known as: IMDUR       TAKE these medications    acetaminophen 325 MG tablet Commonly known as: TYLENOL Take 2 tablets (650 mg total) by mouth every 4 (four) hours as needed for headache or mild pain. What changed: Another medication with the same name was removed. Continue taking this medication, and follow the directions you see here.   allopurinol 100 MG tablet Commonly known as: ZYLOPRIM Take 100 mg by mouth daily.   atorvastatin 10 MG tablet Commonly known as: LIPITOR Take 1 tablet (10 mg total) by mouth at bedtime.   CLEAR EYES OP Place 1 drop into both eyes daily as needed (irritation).   gabapentin 100 MG capsule Commonly known as: NEURONTIN Take 1 capsule (100 mg total) by mouth at bedtime.   hydrALAZINE 25 MG tablet Commonly known as: APRESOLINE Take 2 tablets (50 mg total) by mouth 3 (three) times daily. What changed: how much to take   losartan 50 MG tablet Commonly known as: COZAAR Take 1 tablet (50 mg total) by mouth daily.    metFORMIN 500 MG tablet Commonly known as: GLUCOPHAGE TAKE 1 TABLET (500 MG TOTAL) BY MOUTH DAILY WITH BREAKFAST (MEAL) What changed: See the new instructions.   pantoprazole 40 MG tablet Commonly known as: PROTONIX Take 1 tablet (40 mg total) by mouth daily. Follow-up appt due in Marcht must see provider for future refills   Rivaroxaban 15 MG Tabs tablet Commonly known as: XARELTO Take 1 tablet (15 mg total) by mouth daily with supper. NDC # [id number] Lot #  [id number] Exp- 09/16/23   spironolactone 25 MG tablet Commonly known as: ALDACTONE Take 1 tablet (25 mg total) by mouth daily.   sucralfate 1 GM/10ML suspension Commonly known as: Carafate Take 10 mLs (1 g total) by mouth 4 (four) times daily -  with meals and at bedtime.   torsemide 20 MG tablet Commonly known as: DEMADEX Take 2 tablets (40 mg total) by mouth daily. What changed: how much to take   Trelegy Ellipta 100-62.5-25 MCG/ACT Aepb Generic drug: Fluticasone-Umeclidin-Vilant Inhale ONE PUFF into THE lungs  daily What changed: See the new instructions.        Follow-up Information     [facility] Follow up on 12/23/2022.   Specialty: Cardiology Why: [facility] 1:30 pm Entrance C, Free Valet parking Please bring all meds to appt Contact information: [address] [id number] mc [place] [address] [phone]               Discharge Exam: Filed Weights   12/12/22 2331 12/13/22 0310 12/14/22 0504  Weight: 81.2 kg 81.2 kg 81.7 kg   BP 116/64 (BP Location: Right Arm)   Pulse 75   Temp (!) 97.3 F (36.3 C) (Oral)   Resp 18   Ht 5' 7.5" (1.715 m)   Wt 81.7 kg   SpO2 96%   BMI 27.79 kg/m   Patient is feeling better, no dyspnea, no chest pain, no edema, PND or orthopnea.   Neurology awake and alert ENT with mild pallor Cardiovascular with S1 and S2 present and rhythmic with no gallops, rubs or  murmurs Respiratory with no rales or wheezing, no rhonchi Abdomen with no distention  No lower extremity edema   Condition at discharge: stable  The results of significant diagnostics from this hospitalization (including imaging, microbiology, ancillary and laboratory) are listed below for reference.   Imaging Studies: ECHOCARDIOGRAM COMPLETE  Result Date: 12/13/2022    ECHOCARDIOGRAM REPORT   Patient Name:   [name] [name] Date of Exam: 12/13/2022 Medical Rec #:  [id number]     Height:       67.5 in Accession #:    [id number]    Weight:       179.0 lb Date of Birth:  1945/12/17     BSA:          1.940 m Patient Age:    59 years      BP:           133/85 mmHg Patient Gender: F             HR:           54 bpm. Exam Location:  Inpatient Procedure: 2D Echo, Cardiac Doppler and Color Doppler Indications:    CHF-Acute Systolic I50.21  History:        Patient has prior history of Echocardiogram examinations. CHF                 and Cardiomyopathy, CAD, Prior CABG and Pacemaker, Carotid                 Disease, Mitral Valve Disease; Risk Factors:Hypertension,                 Diabetes and Former Smoker.  Sonographer:    Aron Baba Referring Phys: [id number] VASUNDHRA RATHORE  Sonographer Comments: Suboptimal parasternal window. Image acquisition challenging due to patient body habitus, Image acquisition challenging due to respiratory motion and Image acquisition challenging due to COPD. IMPRESSIONS  1. Left ventricular ejection fraction, by estimation, is 25%. The left ventricle has severely decreased function. The left ventricle demonstrates global hypokinesis. There is mild concentric left ventricular hypertrophy. Left ventricular diastolic parameters are indeterminate.  2. Right ventricular systolic function is moderately reduced. The right ventricular size is is dilated. There is moderately elevated pulmonary artery systolic pressure. The estimated right ventricular systolic pressure is 55.2 mmHg.  3. Left  atrial size was severely dilated.  4. Right atrial size was severely dilated.  5. The  mitral valve is abnormal. Moderate mitral valve regurgitation.  6. Tricuspid valve regurgitation is mild to moderate.  7. The aortic valve is tricuspid. Aortic valve regurgitation is not visualized.  8. The inferior vena cava is dilated in size with <50% respiratory variability, suggesting right atrial pressure of 15 mmHg. Comparison(s): RVSP increased, RV dilation is better characterized. FINDINGS  Left Ventricle: Left ventricular ejection fraction, by estimation, is 25%. The left ventricle has severely decreased function. The left ventricle demonstrates global hypokinesis. The left ventricular internal cavity size was normal in size. There is mild concentric left ventricular hypertrophy. Left ventricular diastolic parameters are indeterminate. Right Ventricle: The right ventricular size is is dilated. No increase in right ventricular wall thickness. Right ventricular systolic function is moderately reduced. There is moderately elevated pulmonary artery systolic pressure. The tricuspid regurgitant velocity is 3.17 m/s, and with an assumed right atrial pressure of 15 mmHg, the estimated right ventricular systolic pressure is 55.2 mmHg. Left Atrium: Left atrial size was severely dilated. Right Atrium: Right atrial size was severely dilated. Pericardium: There is no evidence of pericardial effusion. Mitral Valve: The mitral valve is abnormal. Moderate mitral valve regurgitation. MV peak gradient, 41.9 mmHg. The mean mitral valve gradient is 4.0 mmHg. Tricuspid Valve: The tricuspid valve is grossly normal. Tricuspid valve regurgitation is mild to moderate. No evidence of tricuspid stenosis. Aortic Valve: The aortic valve is tricuspid. There is mild aortic valve annular calcification. Aortic valve regurgitation is not visualized. Pulmonic Valve: The pulmonic valve was not well visualized. Pulmonic valve regurgitation is not visualized.  Aorta: The aortic root and ascending aorta are structurally normal, with no evidence of dilitation. Venous: The inferior vena cava is dilated in size with less than 50% respiratory variability, suggesting right atrial pressure of 15 mmHg. IAS/Shunts: No atrial level shunt detected by color flow Doppler. Additional Comments: A device lead is visualized in the right ventricle and right atrium.  LEFT VENTRICLE PLAX 2D LVIDd:         5.50 cm      Diastology LVIDs:         4.70 cm      LV e' medial:    2.61 cm/s LV PW:         1.10 cm      LV E/e' medial:  33.5 LV IVS:        1.20 cm      LV e' lateral:   4.88 cm/s LVOT diam:     2.00 cm      LV E/e' lateral: 17.9 LV SV:         24 LV SV Index:   12 LVOT Area:     3.14 cm  LV Volumes (MOD) LV vol d, MOD A2C: 130.5 ml LV vol d, MOD A4C: 149.0 ml LV vol s, MOD A2C: 99.1 ml LV vol s, MOD A4C: 98.4 ml LV SV MOD A2C:     31.4 ml LV SV MOD A4C:     149.0 ml LV SV MOD BP:      38.4 ml RIGHT VENTRICLE RV S prime:     5.22 cm/s TAPSE (M-mode): 1.2 cm LEFT ATRIUM              Index        RIGHT ATRIUM           Index LA diam:        5.00 cm  2.58 cm/m   RA Area:     35.70 cm  LA Vol (A2C):   165.0 ml 85.06 ml/m  RA Volume:   141.00 ml 72.69 ml/m LA Vol (A4C):   128.0 ml 65.99 ml/m LA Biplane Vol: 149.0 ml 76.81 ml/m  AORTIC VALVE LVOT Vmax:   60.10 cm/s LVOT Vmean:  32.400 cm/s LVOT VTI:    0.077 m  AORTA Ao Root diam: 3.00 cm Ao Asc diam:  3.80 cm MITRAL VALVE               TRICUSPID VALVE MV Area (PHT): 4.21 cm    TR Peak grad:   40.2 mmHg MV Area VTI:   0.25 cm    TR Vmax:        317.00 cm/s MV Peak grad:  41.9 mmHg MV Mean grad:  4.0 mmHg    SHUNTS MV Vmax:       3.24 m/s    Systemic VTI:  0.08 m MV Vmean:      214.2 cm/s  Systemic Diam: 2.00 cm MV Decel Time: 180 msec MR Peak grad: 126.6 mmHg MR Mean grad: 67.0 mmHg MR Vmax:      562.50 cm/s MR Vmean:     373.0 cm/s MV E velocity: 87.40 cm/s MV A velocity: 21.30 cm/s MV E/A ratio:  4.10 Riley Lam MD  Electronically signed by Riley Lam MD Signature Date/Time: 12/13/2022/5:36:03 PM    Final    DG Chest 2 View  Result Date: 12/12/2022 CLINICAL DATA:  Cough, shortness of breath EXAM: CHEST - 2 VIEW COMPARISON:  11/21/2022 FINDINGS: Transverse diameter of heart is increased. Central pulmonary vessels are more prominent. There are no signs of alveolar pulmonary edema. Linear density in left parahilar region may suggest scarring or subsegmental atelectasis. There is no focal pulmonary consolidation. There is no pleural effusion or pneumothorax. Patient's chin is partially obscuring the right apex. Pacemaker battery is seen in the left infraclavicular region. There is a metallic clamp in the region of left atrial appendage. IMPRESSION: Cardiomegaly. Central pulmonary vessels are more prominent suggesting mild CHF. There is no focal pulmonary consolidation or signs of alveolar pulmonary edema. Electronically Signed   By: Ernie Avena M.D.   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 Lewayne Bunting, MD   DG Chest Port 1 View  Result Date: 11/21/2022 CLINICAL DATA:  Pacemaker EXAM: PORTABLE CHEST 1 VIEW COMPARISON:  CXR 09/05/22 FINDINGS: Left-sided single lead pacemaker in place with a lead in the right ventricle, unchanged. Status post median sternotomy. Unchanged enlarged cardiac contours. Prominent bilateral interstitial opacities could represent atypical infection or pulmonary venous congestion. No pleural effusion. No pneumothorax. No radiographically apparent displaced rib fractures. Visualized upper abdomen is unremarkable. IMPRESSION: Left-sided single lead pacemaker in place with a lead in the right ventricle, unchanged. Electronically Signed   By: Lorenza Cambridge M.D.   On: 11/21/2022 15:33    Microbiology: Results for orders placed or performed during  the hospital encounter of 05/03/22  Resp Panel by RT-PCR (Flu A&B, Covid) Anterior Nasal Swab     Status: None   Collection Time: 05/03/22  9:50 AM   Specimen: Anterior Nasal Swab  Result Value Ref Range Status   SARS Coronavirus 2 by RT PCR NEGATIVE NEGATIVE Final    Comment: (NOTE) SARS-CoV-2 target nucleic acids are NOT DETECTED.  The SARS-CoV-2 RNA is generally detectable in upper respiratory specimens during the acute phase of infection.  The lowest concentration of SARS-CoV-2 viral copies this assay can detect is 138 copies/mL. A negative result does not preclude SARS-Cov-2 infection and should not be used as the sole basis for treatment or other patient management decisions. A negative result may occur with  improper specimen collection/handling, submission of specimen other than nasopharyngeal swab, presence of viral mutation(s) within the areas targeted by this assay, and inadequate number of viral copies(<138 copies/mL). A negative result must be combined with clinical observations, patient history, and epidemiological information. The expected result is Negative.  Fact Sheet for Patients:  BloggerCourse.com  Fact Sheet for Healthcare Providers:  SeriousBroker.it  This test is no t yet approved or cleared by the [place] FDA and  has been authorized for detection and/or diagnosis of SARS-CoV-2 by FDA under an Emergency Use Authorization (EUA). This EUA will remain  in effect (meaning this test can be used) for the duration of the COVID-19 declaration under Section 564(b)(1) of the Act, 21 U.S.C.section 360bbb-3(b)(1), unless the authorization is terminated  or revoked sooner.       Influenza A by PCR NEGATIVE NEGATIVE Final   Influenza B by PCR NEGATIVE NEGATIVE Final    Comment: (NOTE) The Xpert Xpress SARS-CoV-2/FLU/RSV plus assay is intended as an aid in the diagnosis of influenza from Nasopharyngeal swab  specimens and should not be used as a sole basis for treatment. Nasal washings and aspirates are unacceptable for Xpert Xpress SARS-CoV-2/FLU/RSV testing.  Fact Sheet for Patients: BloggerCourse.com  Fact Sheet for Healthcare Providers: SeriousBroker.it  This test is not yet approved or cleared by the [place] FDA and has been authorized for detection and/or diagnosis of SARS-CoV-2 by FDA under an Emergency Use Authorization (EUA). This EUA will remain in effect (meaning this test can be used) for the duration of the COVID-19 declaration under Section 564(b)(1) of the Act, 21 U.S.C. section 360bbb-3(b)(1), unless the authorization is terminated or revoked.  Performed at [facility], [address], [place], [place] [address]     Labs: CBC: Recent Labs  Lab 12/12/22 1656 12/12/22 2001 12/13/22 0226  WBC 6.2 6.3 5.9  NEUTROABS 4.5 4.5  --   HGB 15.3* 15.5* 15.7*  HCT 46.2* 47.5* 45.6  MCV 90.4 90.8 88.0  PLT 107.0* 119* 107*   Basic Metabolic Panel: Recent Labs  Lab 12/12/22 2001 12/13/22 0226 12/14/22 0446  NA 141 138 139  K 3.8 3.5 3.5  CL 112* 109 105  CO2 18* 20* 20*  GLUCOSE 126* 112* 110*  BUN 19 20 28*  CREATININE 1.30* 1.22* 1.35*  CALCIUM 9.2 9.2 9.3   Liver Function Tests: Recent Labs  Lab 12/14/22 0446  AST 21  ALT 16  ALKPHOS 120  BILITOT 1.2  PROT 7.1  ALBUMIN 3.3*   CBG: Recent Labs  Lab 12/13/22 0833 12/13/22 1210 12/13/22 1558 12/13/22 2111 12/14/22 1334  GLUCAP 115* 103* 129* 142* 110*    Discharge time spent: greater than 30 minutes.  Signed: Coralie Keens, MD [facility] 12/14/2022

## 2022-12-14 NOTE — Hospital Course (Signed)
Mrs. Neer was admitted to the hospital with the working diagnosis of heart failure decompensation.   77 yo female with the past medical history of coronary artery disease, history of cardiac arrest, atrial fibrillation, moderate to severe mitral regurgitation, T2DM, and COPD who presented with dyspnea. At home patient developed worsening dyspnea, cough and orthopnea. Outpatient follow up on 04/25 recommended to increase torsemide dose. On the day of hospitalization she was found volume overloaded and was referred to the ED. On her initial physical examination her blood pressure was 181/121, HR 88, RR 16 and 02 saturation 95%, heart with S1 and S2 present irregularly irregular, positive JVD, lungs with rales bilaterally, and scattered wheezing, abdomen with no distention and positive lower extremity edema.   Na 141, K 3,8 Cl 112, bicarbonate 18 glucose 126 bun 19 cr 1,30  BNP 77,9 High sensitive troponin 42 and 43  Wbc 6,3 hgb 15,5 plt 119   Chest radiograph with cardiomegaly, bilateral hilar vascular congestion, cephalization of the vasculature. Small bilateral pleural effusions.   Pacemaker in place with one right ventricular lead.  Sternotomy wires in place.   EKG 91 bpm, normal axis, normal intervals, atrial fibrillation rhythm with ST depression and T wave inversion in V5 and V6.   Patient was placed on IV furosemide for diuresis.

## 2022-12-14 NOTE — Assessment & Plan Note (Signed)
Rate has been controlled.  Plan to continue blood pressure control and anticoagulation with rivaroxaban.

## 2022-12-14 NOTE — Assessment & Plan Note (Signed)
Follow up cell count is 107.

## 2022-12-15 ENCOUNTER — Encounter: Payer: Self-pay | Admitting: *Deleted

## 2022-12-15 ENCOUNTER — Telehealth: Payer: Self-pay | Admitting: *Deleted

## 2022-12-15 ENCOUNTER — Telehealth (HOSPITAL_COMMUNITY): Payer: Self-pay | Admitting: Emergency Medicine

## 2022-12-15 ENCOUNTER — Other Ambulatory Visit (HOSPITAL_COMMUNITY): Payer: Self-pay | Admitting: Emergency Medicine

## 2022-12-15 ENCOUNTER — Other Ambulatory Visit (HOSPITAL_COMMUNITY): Payer: Self-pay

## 2022-12-15 NOTE — Transitions of Care (Post Inpatient/ED Visit) (Signed)
12/15/2022  Name: [name] [name] MRN: [id number] DOB: November 07, 1945  Today's TOC FU Call Status: Today's TOC FU Call Status:: Successful TOC FU Call Competed TOC FU Call Complete Date: 12/15/22  Transition Care Management Follow-up Telephone Call Date of Discharge: 12/14/22 Discharge Facility: [facility]) Type of Discharge: Inpatient Admission Primary Inpatient Discharge Diagnosis:: acute on chronic CHF/ shortness of breath How have you been since you were released from the hospital?: Better ("I am doing okay.  I have a nurse that has been coming to see me at home for 5 months now-- she just just came out to see me and says I am doing fine.  I am having a little discomfort every now and then, but it goes away quickly") Any questions or concerns?: No  Items Reviewed: Did you receive and understand the discharge instructions provided?: Yes (thoroughly reviewed with patient who verbalizes good understanding of same) Medications obtained,verified, and reconciled?: Yes (Medications Reviewed) (Full medication reconciliation/ review completed; no concerns or discrepancies identified; confirmed patient obtained/ is taking all newly Rx'd medications as instructed; self-manages medications and denies questions/ concerns around medications today) Any new allergies since your discharge?: No Dietary orders reviewed?: Yes Type of Diet Ordered:: Heart Healthy/ Low salt Do you have support at home?: Yes People in Home: friend(s) Name of Support/Comfort Primary Source: Reports independent in self-care activities; supportive roomate assists as/ if needed/ indicated  Medications Reviewed Today: Medications Reviewed Today     Reviewed by Michaela Corner, RN (Registered Nurse) on 12/15/22 at 1250  Med List Status: <None>   Medication Order Taking? Sig Documenting Provider Last Dose Status Informant  acetaminophen (TYLENOL) 325 MG tablet [id number]  Take 2 tablets (650 mg total) by mouth every 4 (four)  hours as needed for headache or mild pain. [name], MD  Active Self  allopurinol (ZYLOPRIM) 100 MG tablet [id number]  Take 100 mg by mouth daily. [provider]  Active Self           Med Note Sherrie Mustache, KYLE A   Thu Nov 17, 2022 11:13 AM)    atorvastatin (LIPITOR) 10 MG tablet [id number]  Take 1 tablet (10 mg total) by mouth at bedtime. [name], MD  Active Self  gabapentin (NEURONTIN) 100 MG capsule [id number]  Take 1 capsule (100 mg total) by mouth at bedtime. Vivi Barrack, DPM  Active Self  hydrALAZINE (APRESOLINE) 25 MG tablet [id number]  Take 2 tablets (50 mg total) by mouth 3 (three) times daily. Arrien, York Ram, MD  Active   losartan (COZAAR) 50 MG tablet [id number]  Take 1 tablet (50 mg total) by mouth daily. [name], MD  Active Self  metFORMIN (GLUCOPHAGE) 500 MG tablet [id number]  TAKE 1 TABLET (500 MG TOTAL) BY MOUTH DAILY WITH BREAKFAST (MEAL)  Patient taking differently: Take 500 mg by mouth daily with breakfast.   [name], MD  Active Self  Naphazoline HCl (CLEAR EYES OP) [id number]  Place 1 drop into both eyes daily as needed (irritation).  Patient not taking: Reported on 12/15/2022   [provider]  Active Self           Med Note Katrinka Blazing, [name]   Fri Dec 02, 2022  2:01 PM) Does not like the eye drops  pantoprazole (PROTONIX) 40 MG tablet [id number]  Take 1 tablet (40 mg total) by mouth daily. Follow-up appt due in Marcht must see provider for future refills [name], MD  Active Self  Rivaroxaban (XARELTO) 15 MG TABS tablet [id number]  Take 1 tablet (15 mg total) by mouth daily with supper. NDC # 29528-413-24 Lot #  [id number] Exp- 09/16/23 [name], MD  Active Self           Med Note Katrinka Blazing, [name]   Caleen Essex Dec 02, 2022  2:02 PM) Still needs financial assistance  spironolactone (ALDACTONE) 25 MG tablet [id number]  Take 1 tablet (25 mg total) by mouth daily. [name], MD  Active Self  sucralfate (CARAFATE) 1 GM/10ML  suspension [id number]  Take 10 mLs (1 g total) by mouth 4 (four) times daily -  with meals and at bedtime.  Patient not taking: Reported on 12/12/2022   Dione Booze, MD  Active Self           Med Note Sherrie Mustache, [place] A   Thu Nov 17, 2022 11:17 AM)    torsemide (DEMADEX) 20 MG tablet [id number]  Take 2 tablets (40 mg total) by mouth daily. Arrien, York Ram, MD  Active   TRELEGY ELLIPTA 100-62.5-25 MCG/ACT AEPB [id number]  Inhale ONE PUFF into THE lungs daily  Patient not taking: Reported on 12/15/2022   [name], MD  Active Self           Med Note Katrinka Blazing, [name]   Thu Nov 17, 2022  2:30 PM) Takes "only as I need it."            Home Care and Equipment/Supplies: Were Home Health Services Ordered?: No Any new equipment or medical supplies ordered?: No  Functional Questionnaire: Do you need assistance with bathing/showering or dressing?: No Do you need assistance with meal preparation?: No Do you need assistance with eating?: No Do you have difficulty maintaining continence: No Do you need assistance with getting out of bed/getting out of a chair/moving?: No Do you have difficulty managing or taking your medications?: No  Follow up appointments reviewed: PCP Follow-up appointment confirmed?: Yes (care coordination outreach in real-time with scheduling care guide to successfully schedule hospital follow up PCP appointment 12/20/22) Date of PCP follow-up appointment?: 12/20/22 Follow-up Provider: PCP Specialist Hospital Follow-up appointment confirmed?: Yes Date of Specialist follow-up appointment?: 12/23/22 Follow-Up Specialty Provider:: Heart Failure Clinic provider Do you need transportation to your follow-up appointment?: No Do you understand care options if your condition(s) worsen?: Yes-patient verbalized understanding  SDOH Interventions Today    Flowsheet Row Most Recent Value  SDOH Interventions   Food Insecurity Interventions Intervention Not Indicated   Transportation Interventions Intervention Not Indicated  [normally drives self,  roommate assists as/ if needed]      TOC Interventions Today    Flowsheet Row Most Recent Value  TOC Interventions   TOC Interventions Discussed/Reviewed TOC Interventions Discussed, Arranged PCP follow up within 7 days/Care Guide scheduled  [Patient declines need for ongoing/ further care coordination outreach,  no care coordination needs identified at time of TOC call today,  patient declines taking my direct number, states she has a visiting home nurse and "that is enough"]      Interventions Today    Flowsheet Row Most Recent Value  Chronic Disease   Chronic disease during today's visit Congestive Heart Failure (CHF)  General Interventions   General Interventions Discussed/Reviewed Doctor Visits, General Interventions Discussed  Doctor Visits Discussed/Reviewed Doctor Visits Discussed, Specialist, PCP  PCP/Specialist Visits Compliance with follow-up visit  Nutrition Interventions   Nutrition Discussed/Reviewed Nutrition Discussed  Pharmacy Interventions   Pharmacy Dicussed/Reviewed Pharmacy Topics Discussed  [Full medication review with  updating medication list in EHR per patient report]      Caryl Pina, RN, BSN, CCRN Alumnus RN CM Care Coordination/ Transition of Care- [facility] Care Management [phone]: direct office

## 2022-12-15 NOTE — Telephone Encounter (Signed)
Called to follow up with [name] [name] following hospitalization.  Will do a home visit today @ 10:30    [name], [name] [phone] 12/15/2022

## 2022-12-15 NOTE — Progress Notes (Signed)
Paramedicine Encounter    Patient ID: [name] [name], female    DOB: June 21, 1946, 77 y.o.   MRN: [id number]   Complaints NONE  Assessment A&O x 4, skin W&D w/ good color. Lung sounds clear throughout and no edema noted.  Compliance with meds unable to physically measure as she is taking from pill bottles  Pill box filled not fillled  Refills needed   Meds changes since last visit increase torsemide to 40mg  daily and increase Hydralazine 50mg . TID    Social changes NONE   BP 120/80 (BP Location: Left Arm, Patient Position: Sitting, Cuff Size: Normal)   Pulse 63   Resp 16   Wt 171 lb 6.4 oz (77.7 kg)   SpO2 99%   BMI 26.45 kg/m  Weight yesterday-not taken Last visit weight-182lbs   [name] still refuses to use a pill box.  She tells me that she takes her Torsemide when she wants to depending on whether or not she's going to be out of the house.  We discussed fluid intake and the importance of weight changes.  She is hyper focused on the fact that she's lost weight while she was in the hospital.  I attempted to explain to her that her weight loss was primarily fluid.  She tells me she feels like her doctors are working against one another and they don't know what they are doing.  I reassured her that she has great doctors and that one deals with the heart as the pump and the other focuses on the electrical properties of the heart.   She tells me today that she wishes to switch pharmacies from [facility] to [facility] because [facility] doesn't take the [facility].   ACTION: Home visit completed  [name] [phone] 12/16/22  Patient Care Team: [name], MD as PCP - General (Internal Medicine) [name], MD as PCP - Cardiology (Cardiology) [name], MD as PCP - Electrophysiology (Cardiology) Chrystie Nose, MD as Consulting Physician (Cardiology)  Patient Active Problem List   Diagnosis Date Noted   Type 2 diabetes mellitus with hyperlipidemia  (HCC) 12/14/2022   Acute cough 12/12/2022   SOB (shortness of breath) 12/12/2022   Acute on chronic systolic CHF (congestive heart failure) (HCC) 12/12/2022   Ceruminosis, bilateral 06/17/2022   Flu vaccine need 06/17/2022   Chronic combined systolic and diastolic CHF (congestive heart failure) (HCC) 06/17/2022   Encounter for general adult medical examination with abnormal findings 06/16/2022   Chronic anticoagulation 06/06/2022   Mitral regurgitation 06/06/2022   Controlled type 2 diabetes mellitus without complication, without long-term current use of insulin (HCC) 06/06/2022   Pacemaker 02/22/2022   Hypertensive urgency 06/08/2021   Obstructive sleep apnea 09/21/2020   Obesity (BMI 30.0-34.9) 02/10/2020   COPD (chronic obstructive pulmonary disease) (HCC) 10/26/2019   Thrombocytopenia (HCC) 09/14/2017   Medication noncompliance due to cognitive impairment 09/06/2017   Persistent atrial fibrillation (HCC) 08/04/2017   Cardiomyopathy, ischemic 02/08/2017   CKD (chronic kidney disease), stage III (HCC) 01/30/2017   Coronary artery disease due to lipid rich plaque    Essential hypertension     Current Outpatient Medications:    acetaminophen (TYLENOL) 325 MG tablet, Take 2 tablets (650 mg total) by mouth every 4 (four) hours as needed for headache or mild pain., Disp: , Rfl:    allopurinol (ZYLOPRIM) 100 MG tablet, Take 100 mg by mouth daily., Disp: , Rfl:    atorvastatin (LIPITOR) 10 MG tablet, Take 1 tablet (10 mg  total) by mouth at bedtime., Disp: 90 tablet, Rfl: 1   gabapentin (NEURONTIN) 100 MG capsule, Take 1 capsule (100 mg total) by mouth at bedtime., Disp: 90 capsule, Rfl: 0   hydrALAZINE (APRESOLINE) 25 MG tablet, Take 2 tablets (50 mg total) by mouth 3 (three) times daily., Disp: 180 tablet, Rfl: 1   losartan (COZAAR) 50 MG tablet, Take 1 tablet (50 mg total) by mouth daily., Disp: 30 tablet, Rfl: 6   metFORMIN (GLUCOPHAGE) 500 MG tablet, TAKE 1 TABLET (500 MG TOTAL) BY  MOUTH DAILY WITH BREAKFAST (MEAL) (Patient taking differently: Take 500 mg by mouth daily with breakfast.), Disp: 90 tablet, Rfl: 0   pantoprazole (PROTONIX) 40 MG tablet, Take 1 tablet (40 mg total) by mouth daily. Follow-up appt due in Marcht must see provider for future refills, Disp: 90 tablet, Rfl: 0   Rivaroxaban (XARELTO) 15 MG TABS tablet, Take 1 tablet (15 mg total) by mouth daily with supper. NDC # [id number] Lot #  [id number] Exp- 09/16/23, Disp: 21 tablet, Rfl: 0   spironolactone (ALDACTONE) 25 MG tablet, Take 1 tablet (25 mg total) by mouth daily., Disp: 30 tablet, Rfl: 11   torsemide (DEMADEX) 20 MG tablet, Take 2 tablets (40 mg total) by mouth daily., Disp: 60 tablet, Rfl: 1   Naphazoline HCl (CLEAR EYES OP), Place 1 drop into both eyes daily as needed (irritation)., Disp: , Rfl:    sucralfate (CARAFATE) 1 GM/10ML suspension, Take 10 mLs (1 g total) by mouth 4 (four) times daily -  with meals and at bedtime., Disp: 420 mL, Rfl: 0   TRELEGY ELLIPTA 100-62.5-25 MCG/ACT AEPB, Inhale ONE PUFF into THE lungs daily, Disp: 120 each, Rfl: 1 Allergies  Allergen Reactions   Bee Venom Anaphylaxis   Ivp Dye [Iodinated Contrast Media] Anaphylaxis   Ace Inhibitors     angioedema   Atenolol     severe headaches   Codeine Nausea And Vomiting   Entresto [Sacubitril-Valsartan] Other (See Comments)    Chest pain    Isosorbide     Severe headaches   Shrimp [Shellfish Allergy] Swelling   Simvastatin     not sure   Jardiance [Empagliflozin] Other (See Comments)    Caused Boils    Penicillin G Rash     Social History   Socioeconomic History   Marital status: Widowed    Spouse name: Not on file   Number of children: 1   Years of education: 8   Highest education level: Not on file  Occupational History   Occupation: RETIRED [employer]  Tobacco Use   Smoking status: Former    Packs/day: 0.50    Years: 56.00    Additional pack years: 0.00    Total pack years: 28.00     Types: Cigarettes    Quit date: 11/13/2021    Years since quitting: 1.0   Smokeless tobacco: Never   Tobacco comments:    smoking 2 cigarettes/day as of 10/23/20  Vaping Use   Vaping Use: Never used  Substance and Sexual Activity   Alcohol use: No    Alcohol/week: 0.0 standard drinks of alcohol   Drug use: No   Sexual activity: Not on file  Other Topics Concern   Not on file  Social History Narrative   Patient reports it being difficult to pay for everything due to outstanding medical bills from hospital stay last year but states she is able to keep up with basic expenses.  Patient does have some concerns  with her house's condition due to a water leak- had her roof repaired last month but has some leaking in the front which has affected her porch.  Patient owns her own car and is able to drive herself but has some concerns about the reliability of her car- has gotten taxi to the clinic for appointment in the past when her car broke down.   Social Determinants of Health   Financial Resource Strain: Low Risk  (06/07/2018)   Overall Financial Resource Strain (CARDIA)    Difficulty of Paying Living Expenses: Not hard at all  Recent Concern: Financial Resource Strain - Medium Risk (05/21/2018)   Overall Financial Resource Strain (CARDIA)    Difficulty of Paying Living Expenses: Somewhat hard  Food Insecurity: No Food Insecurity (12/15/2022)   Hunger Vital Sign    Worried About Running Out of Food in the Last Year: Never true    Ran Out of Food in the Last Year: Never true  Transportation Needs: No Transportation Needs (12/15/2022)   PRAPARE - Administrator, Civil Service (Medical): No    Lack of Transportation (Non-Medical): No  Physical Activity: Not on file  Stress: Not on file  Social Connections: Not on file  Intimate Partner Violence: Not At Risk (06/09/2022)   Humiliation, Afraid, Rape, and Kick questionnaire    Fear of Current or Ex-Partner: No    Emotionally Abused: No     Physically Abused: No    Sexually Abused: No    Physical Exam      Future Appointments  Date Time Provider Department Center  12/20/2022  4:00 PM [name], MD [facility] None  12/21/2022 11:15 AM [facility]  12/23/2022  1:30 PM [facility] PA/NP [facility] None  12/27/2022  9:00 AM Iran Ouch, MD [facility] None  01/12/2023 11:00 AM [name], MD [facility]  01/17/2023  7:00 AM [facility] DEVICE REMOTES [facility]  03/07/2023  2:00 PM [name], MD [facility]  04/18/2023  7:05 AM [facility] DEVICE REMOTES [facility]  07/18/2023  7:00 AM [facility] DEVICE REMOTES [facility]  10/17/2023  7:00 AM [facility] DEVICE REMOTES [facility]

## 2022-12-16 ENCOUNTER — Telehealth (HOSPITAL_COMMUNITY): Payer: Self-pay | Admitting: Emergency Medicine

## 2022-12-16 ENCOUNTER — Other Ambulatory Visit (HOSPITAL_COMMUNITY): Payer: Self-pay

## 2022-12-16 ENCOUNTER — Telehealth: Payer: Self-pay | Admitting: *Deleted

## 2022-12-16 ENCOUNTER — Encounter: Payer: Self-pay | Admitting: *Deleted

## 2022-12-16 DIAGNOSIS — I5042 Chronic combined systolic (congestive) and diastolic (congestive) heart failure: Secondary | ICD-10-CM

## 2022-12-16 DIAGNOSIS — I4819 Other persistent atrial fibrillation: Secondary | ICD-10-CM

## 2022-12-16 MED ORDER — SPIRONOLACTONE 25 MG PO TABS: 25.0000 mg | ORAL_TABLET | Freq: Every day | ORAL | 11 refills | Status: DC

## 2022-12-16 MED ORDER — LOSARTAN POTASSIUM 50 MG PO TABS: 50.0000 mg | ORAL_TABLET | Freq: Every day | ORAL | 6 refills | Status: DC

## 2022-12-16 MED ORDER — RIVAROXABAN 15 MG PO TABS
15.0000 mg | ORAL_TABLET | Freq: Every day | ORAL | 0 refills | Status: DC
Start: 2022-12-16 — End: 2022-12-23

## 2022-12-16 MED ORDER — HYDRALAZINE HCL 25 MG PO TABS
50.0000 mg | ORAL_TABLET | Freq: Three times a day (TID) | ORAL | 1 refills | Status: DC
Start: 2022-12-16 — End: 2023-02-03

## 2022-12-16 NOTE — Patient Outreach (Signed)
  Care Coordination   Care Coordination/ collaboration  Visit Note   12/16/2022 Name: [name] [name] MRN: [id number] DOB: 10-17-1945  [name] [name] is a 77 y.o. year old female who sees [name], MD for primary care. I spoke with EMT-P/ CHF Paramedicine Program [name] [name] re:  [name] [name] by phone today.  SDOH assessments and interventions completed:  No Previously completed during TOC call 12/15/22   Care Coordination Interventions:  Yes, provided   Interventions Today    Flowsheet Row Most Recent Value  Chronic Disease   Chronic disease during today's visit Congestive Heart Failure (CHF)  General Interventions   General Interventions Discussed/Reviewed Communication with  Communication with --  [EMT-P with CHF Clinic]  Pharmacy Interventions   Pharmacy Dicussed/Reviewed Pharmacy Topics Discussed  [EMT-P CHF clinic- reported several medication discrepancies/ concerns discovered during her home visit with patient yesterday 12/15/22: collaborated with EMT-P who will contact PCP for clarification/ explanation to patient at time of HFU OV sched'd 12/20/22]      Follow up plan: No further intervention required.  Discussed with [name] that she can contact myself or [facility] CM at any point in the future for assistance with patient care coordination needs; today, [name] reports she does not believe patient will engage with [facility] CM team, as per patient's declining of same during [facility] call yesterday; [name] and I will stay in touch as indicated moving forward  Encounter Outcome:  Pt. Visit Completed   [name] Pina, RN, BSN, CCRN Alumnus RN CM Care Coordination/ Transition of Care- [facility]  Care Management [phone]: direct office

## 2022-12-16 NOTE — Telephone Encounter (Signed)
Created in error

## 2022-12-19 DIAGNOSIS — J40 Bronchitis, not specified as acute or chronic: Secondary | ICD-10-CM | POA: Diagnosis not present

## 2022-12-19 DIAGNOSIS — R0602 Shortness of breath: Secondary | ICD-10-CM | POA: Diagnosis not present

## 2022-12-20 ENCOUNTER — Inpatient Hospital Stay: Payer: Medicare Other | Admitting: Internal Medicine

## 2022-12-20 NOTE — Progress Notes (Signed)
Advanced Heart Failure Clinic Note  PCP: [name], MD Primary Cardiologist: Dr. Anne Fu  EP: Dr Ladona Ridgel HF MD: Dr Gala Romney   HPI: [name] [name] is a 77 y.o. female with h/o HTN, former tobacco abuse, permanent AF, CAD s/p CABG x 2 (LIMA to LAD and SVG to OM) in 2016, Chronic combined HF, Ischemic CM, HLD, and Biotronik PPM.   She was initially noted to be in atrial fibrillation in 2016 with a reduced LVEF, leading to ischemic workup with a cardiac cath showing severe multivessel CAD with LM dz -> CABG x2 utilizing LIMA to LAD, SVG to OM.   Admitted 1/19 for AF with RVR in setting of Influenza A. EP was consulted to discuss plans for Tikosyn vs. ablation vs. medication rate control however, due to her known medication noncompliance she was found to not be an ablation nor antiarrhythmic candidate   2019 back in Afib. She was started on amiodarone & Entresto.  She underwent successful atrial flutter TEE/DCCV on 12/25/17.  Later went back in A fib.   Admitted 3/21 for iron-def anemia. Hgb 5.4 EGD/colonoscopy which were unremarkable and capsule endoscopy which showed duodenal ulcer with bleeding and stigmata.  GI recommended oral Protonix and outpatient follow-up.  Admitted 6/22 for AV node ablation and PPM due to uncontrolled heart rates with A fib. Underwent Biotronix single lead PPM. Toprol XL and digoxin stopped. Carvedilol 25 mg bid started. She was discharged 01/19/21.    Admitted 02/10/21 with a/c systolic heart failure. Diuresed with IV lasix and transitioned to torsemide 40 mg daily. EF 25-30% with severe MR/TR.    Admitted 10/22 for COPD exacerbation with a component of A/C CHF, ran out of torsemide several days prior  Echo 07/30/21: EF 25-30% Moderate RV dysfunction. Severe biatrial enlargement. Severe central MR/TR.   Was seen at [facility] in 4/23 for second opinion. Restarted Entresto 24/26 bid. Did not tolerate due to CP. Agreed she might candidate for  mTEER down the road.   She was seen in the ED  June, July, August, and twice in September 2023 for chest pain/shortness of breath.    Followed by EP for device upgrade. She was hesitant to pursue any procedures. Possible CRT discussed.   Admitted 10/23 with a/c HF, worrisome for low output. Echo showed EF 20%, severe LV dysfunction, RV moderately down.  Diuresed with IV lasix and started on milrinone. RHC showed low volume and CI 1.9 on milrinone, improved with small IVF bolus. Drips weaned and GDMT added back. BiDil increased with elevated BP. Referred back to Paramedicine to help with meds. Discharged home, weight 183 lbs.  EP attempted to upgrade her PPM to ICD on 04/08 but she was found to have occlusion of left subclavian vein and procedure was aborted. Saw Dr. Ladona Ridgel 04/25 and offered lead extraction and insertion of BiV ICD with removal of pacing lead and insertion of CS lead, left bundle area lead and ICD lead. She was not interested. She appeared volume overloaded and Torsemide was increased.   Admitted 4/24 with a/c HF, 2/2 medical non-adherence. Diuresed with IV lasix. Echo showed EF 25%, RV moderately reduced, RVSP 55 mmHg, severe BAE, moderate MR. GDMT titrated, and she was discharged home, weight 171 lbs.  Today she returns for post hospital HF follow up with [name] with paramedicine. Overall feeling fair. Has a cough and URI  symptoms x 4 days. Has chronic low back pain. No SOB with ADLs, has dyspnea with walking further distances on flat ground. Denies palpitations, abnormal bleeding, CP, dizziness, edema, or PND/Orthopnea. Appetite ok. No fever or chills. Weight at home 171 pounds. Taking all medications. Does not allow [name] to help with her medications. Smoking occasionally, not daily.  Cardiac Studies   - Echo (4/24): EF 25%, RV moderately down, moderate MR  - RHC (10/23): on milrinone 0.25 RA = 1 RV = 27/1 PA = 32/13 (18) PCW = 2 Fick cardiac output/index = 5.3/2.7 Thermo =  3.8/1.9 PVR = 3.0 (fick) 4.2 (TD) Ao sat = 93% PA sat = 71%, 68%   Given 175 cc NS load   PCW = 5 TD CO/CI = 4.1/2.1   1. Low volume status 2. With mild to moderately depressed CO on milrinone 3. Mild improvement in hemodynamics with volume loading    - Echo (10/23): EF 20%, severe LV dysfunction, RV moderately down  - Echo (6/22): EF 25-30% RV severely reduced  - Echo (4/22): EF 25-30% RV moderately reduced   - Echo (11/21): EF 30-35% RV normal   - Echo (4/21): EF 55-60%   - Stress Test (1/20): Low Risk EF 44%  - Echo (12/19): EF 30-35% RV mildly dilated.  - Echo (3/19): EF 30-35%, moderate LVH, Mild AI, Moderate regurgitation, RV moderately down, PA peak pressure 50 mm Hg.   - Echo (5/18): EF stable 50-55%  - Echo (10/17): EF improved to 50-55%.  - Echo (3/16): EF 35-40%.  ROS: All systems negative except as listed in HPI, PMH and Problem List.  SH:  Social History   Socioeconomic History   Marital status: Widowed    Spouse name: Not on file   Number of children: 1   Years of education: 9   Highest education level: Not on file  Occupational History   Occupation: RETIRED [employer]  Tobacco Use   Smoking status: Former    Packs/day: 0.50    Years: 56.00    Additional pack years: 0.00    Total pack years: 28.00    Types: Cigarettes    Quit date: 11/13/2021    Years since quitting: 1.1   Smokeless tobacco: Never   Tobacco comments:    smoking 2 cigarettes/day as of 10/23/20  Vaping Use   Vaping Use: Never used  Substance and Sexual Activity   Alcohol use: No    Alcohol/week: 0.0 standard drinks of alcohol   Drug use: No   Sexual activity: Not on file  Other Topics Concern   Not on file  Social History Narrative   Patient reports it being difficult to pay for everything due to outstanding medical bills from hospital stay last year but states she is able to keep up with basic expenses.  Patient does have some concerns with her house's  condition due to a water leak- had her roof repaired last month but has some leaking in the front which has affected her porch.  Patient owns her own car and is able to drive herself but has some concerns about the reliability of her car- has gotten taxi to the clinic for appointment in the past when her car broke down.   Social Determinants of Health   Financial Resource Strain: Low Risk  (06/07/2018)   Overall Financial Resource Strain (CARDIA)    Difficulty of Paying Living Expenses: Not hard at all  Recent Concern: Financial Resource Strain - Medium Risk (  05/21/2018)   Overall Financial Resource Strain (CARDIA)    Difficulty of Paying Living Expenses: Somewhat hard  Food Insecurity: No Food Insecurity (12/15/2022)   Hunger Vital Sign    Worried About Running Out of Food in the Last Year: Never true    Ran Out of Food in the Last Year: Never true  Transportation Needs: No Transportation Needs (12/15/2022)   PRAPARE - Administrator, Civil Service (Medical): No    Lack of Transportation (Non-Medical): No  Physical Activity: Not on file  Stress: Not on file  Social Connections: Not on file  Intimate Partner Violence: Not At Risk (06/09/2022)   Humiliation, Afraid, Rape, and Kick questionnaire    Fear of Current or Ex-Partner: No    Emotionally Abused: No    Physically Abused: No    Sexually Abused: No   FH:  Family History  Problem Relation Age of Onset   Cancer Mother        LYMPHOMA   Heart disease Father    Other Father        TB   CVA Sister    Prostate cancer Brother 31   Diabetes Brother    Past Medical History:  Diagnosis Date   (HFimpEF) heart failure with improved ejection fraction (HCC)    a. 07/2018 Echo: EF 30-35%; b. 11/2019 Echo: EF 55-60%, no rwma, Gr2 DD, Nl RV size/fxn. Mild BAE. Mild MR/AI.   Arthritis    Asthma    Atopic dermatitis    CAD (coronary artery disease)    a. 2016 s/p CABG x 2 (LIMA->LAD, VG->OM); b. 08/2018 MV: EF 44%, no  ischemia/infact.   Cardiac arrest (HCC) 10/2014   Cardiomyopathy, ischemic    a. 07/2018 Echo: EF 30-35%; 11/2019 Echo: EF 55-60%.   Carotid arterial disease (HCC)    a. 11/2019 Carotid U/S   CHF (congestive heart failure) (HCC)    CKD (chronic kidney disease), stage III (HCC)    COPD (chronic obstructive pulmonary disease) (HCC)    Diabetes mellitus without complication (HCC)    Dysrhythmia    Esophageal dilatation 2013   GERD (gastroesophageal reflux disease)    Glaucoma    Gout    Headache    Hypertension    Hypokalemia    Idiopathic angioedema    LGI bleed 08/06/2017   a. felt to be hemorrhoidal during that admission (no drop in Hgb).   Lower back pain    Paroxysmal atrial fibrillation (HCC)    a. Dx 2016-->h/o difficult to control rates (complicated by noncompliance), not felt to be a candidate for ablation or antiarrhythmic due to noncompliance; b. Recurrent AF 2021 - converted w/ IV dilt; c. CHA2DS2VASc = 7-->Xarelto.   Personal history of noncompliance with medical treatment, presenting hazards to health    Prediabetes    Presence of permanent cardiac pacemaker    S/P CABG x 2 with clipping of LA appendage 11/14/2014   LIMA to LAD, SVG to OM, EVH via right thigh   Urine incontinence    Uterine fibroid    Current Outpatient Medications  Medication Sig Dispense Refill   acetaminophen (TYLENOL) 325 MG tablet Take 2 tablets (650 mg total) by mouth every 4 (four) hours as needed for headache or mild pain.     allopurinol (ZYLOPRIM) 100 MG tablet Take 100 mg by mouth daily.     atorvastatin (LIPITOR) 10 MG tablet Take 1 tablet (10 mg total) by mouth at bedtime. 90 tablet 1  gabapentin (NEURONTIN) 100 MG capsule Take 1 capsule (100 mg total) by mouth at bedtime. 90 capsule 0   hydrALAZINE (APRESOLINE) 25 MG tablet Take 2 tablets (50 mg total) by mouth 3 (three) times daily. 180 tablet 1   losartan (COZAAR) 50 MG tablet Take 1 tablet (50 mg total) by mouth daily. 30 tablet 6    metFORMIN (GLUCOPHAGE) 500 MG tablet TAKE 1 TABLET (500 MG TOTAL) BY MOUTH DAILY WITH BREAKFAST (MEAL) (Patient taking differently: Take 500 mg by mouth daily with breakfast.) 90 tablet 0   Naphazoline HCl (CLEAR EYES OP) Place 1 drop into both eyes daily as needed (irritation).     pantoprazole (PROTONIX) 40 MG tablet Take 1 tablet (40 mg total) by mouth daily. Follow-up appt due in Marcht must see provider for future refills 90 tablet 0   Rivaroxaban (XARELTO) 15 MG TABS tablet Take 1 tablet (15 mg total) by mouth daily with supper. NDC # [id number] Lot #  [id number] Exp- 09/16/23 21 tablet 0   spironolactone (ALDACTONE) 25 MG tablet Take 1 tablet (25 mg total) by mouth daily. 30 tablet 11   sucralfate (CARAFATE) 1 GM/10ML suspension Take 10 mLs (1 g total) by mouth 4 (four) times daily -  with meals and at bedtime. 420 mL 0   torsemide (DEMADEX) 20 MG tablet Take 2 tablets (40 mg total) by mouth daily. 60 tablet 1   TRELEGY ELLIPTA 100-62.5-25 MCG/ACT AEPB Inhale ONE PUFF into THE lungs daily (Patient taking differently: As needed) 120 each 1   No current facility-administered medications for this encounter.   BP 96/68   Pulse 68   Wt 77.5 kg (170 lb 12.8 oz)   SpO2 98%   BMI 26.36 kg/m   Wt Readings from Last 3 Encounters:  12/23/22 77.5 kg (170 lb 12.8 oz)  12/23/22 77.5 kg (170 lb 12.8 oz)  12/15/22 77.7 kg (171 lb 6.4 oz)   PHYSICAL EXAM: General:  NAD. No resp difficulty, walked into clinic, fatigued-appearing. HEENT: Normal Neck: Supple. No JVD. Carotids 2+ bilat; no bruits. No lymphadenopathy or thryomegaly appreciated. Cor: PMI nondisplaced. Irregular rate & rhythm. No rubs, gallops or murmurs. Lungs: Diminished throughout Abdomen: Soft, nontender, nondistended. No hepatosplenomegaly. No bruits or masses. Good bowel sounds. Extremities: No cyanosis, clubbing, rash, edema Neuro: Alert & oriented x 3, cranial nerves grossly intact. Moves all 4 extremities w/o difficulty.  Affect pleasant.  ECG (personally reviewed): atrial fibrillation, v pacing 64 bpm  ReDs: 34%  ASSESSMENT & PLAN: 1. Chronic Systolic Heart Failure - Suspect Combined ICM /NICM with previous A fib RVR. RV Pacing? - In 2021 EF improved 55-60% but over the last year EF has been down 25-30 % with severe MR/TR - Echo (10/23): EF 20%, moderate LVH, RV moderately reduced, RVSP 46 mmHg, moderate BAE, mild to moderate MR, moderate TR,  Echo (4/24): EF 25%, RV moderately reduced, RVSP 55 mmHg, severe BAE, moderate MR - NYHA II-early III, volume ok. ReDs 34% - Change torsemide to 40 mg daily (has been taking 20 bid). - Continue spironolactone 25 mg daily. - Continue losartan 50 mg daily (failed Entresto with chest pain).  - Continue hydralazine 50 mg tid, refuses Imdur d/t headaches. - No SGLT2i due UTI/yeast infections.  - No beta blocker with recent acute exacerbation and high burden of RV pacing on device checks (61% last check 3/24) - Would likely benfit from upgrade to CRT. Upgrade PPM to ICD aborted earlier this month d/t occlusion of left subclavian vein.  Dr. Ladona Ridgel saw patient and offered lead extraction and insertion of CRT which she declined. - Discussed better adherence with fluid/sodium intake. Followed by Paramedicine but not accepting assistance with managing medications. Discussed this again today and she is agreeable to allowing [name] manage her medications. - Labs today.  2. MR, Previously Severe - Moderate on most recent echo. - Follow on echo   3. Permanent A fib  - Biotronik dual chamber PPM/AV nodal ablation 2022.  - See above regarding device - Rate controlled.  - Continue Xarelto 15 mg daily (adjusted for CrCl). No bleeding issues. - CBC today   4. CAD - s/p CABG x2 2016 (LIMA to LAD and SVG to OM)  - No chest pain.  - No ASA given AC - Continue statin.   5. COPD - Discussed smoking cessation. - Not smoking daily.   6. Hx PVCs - During prior admit, on  milrinone. - Asymptomatic. - None on ECG today - Labs today.  7. CKD IIIb - Baseline SCr 1.2-1.4 - Labs today.  8. Hypertension - BP on the low side today. - GDMT as above.   Continue HF Paramedicine for assistance with medications. She is at high risk for readmission. She is agreeable to have [name] fill her pillbox.  Follow up in 3-4 months with Dr. Gala Romney. Given OTC med list today for URI.  Prince Rome, FNP-BC 12/23/22

## 2022-12-21 ENCOUNTER — Ambulatory Visit: Payer: Medicare Other

## 2022-12-21 DIAGNOSIS — I5042 Chronic combined systolic (congestive) and diastolic (congestive) heart failure: Secondary | ICD-10-CM | POA: Diagnosis not present

## 2022-12-21 DIAGNOSIS — I10 Essential (primary) hypertension: Secondary | ICD-10-CM | POA: Diagnosis not present

## 2022-12-22 ENCOUNTER — Encounter (HOSPITAL_COMMUNITY): Payer: Medicare Other

## 2022-12-22 ENCOUNTER — Telehealth (HOSPITAL_COMMUNITY): Payer: Self-pay

## 2022-12-22 LAB — BASIC METABOLIC PANEL
BUN/Creatinine Ratio: 35 — ABNORMAL HIGH (ref 12–28)
BUN: 68 mg/dL — ABNORMAL HIGH (ref 8–27)
CO2: 20 mmol/L (ref 20–29)
Calcium: 10.1 mg/dL (ref 8.7–10.3)
Chloride: 95 mmol/L — ABNORMAL LOW (ref 96–106)
Creatinine, Ser: 1.93 mg/dL — ABNORMAL HIGH (ref 0.57–1.00)
Glucose: 135 mg/dL — ABNORMAL HIGH (ref 70–99)
Potassium: 3.4 mmol/L — ABNORMAL LOW (ref 3.5–5.2)
Sodium: 138 mmol/L (ref 134–144)
eGFR: 26 mL/min/{1.73_m2} — ABNORMAL LOW (ref >59)

## 2022-12-22 NOTE — Telephone Encounter (Signed)
Called and left patient a voice message to confirm/remind patient of their appointment at the [facility] on 12/23/22.    

## 2022-12-23 ENCOUNTER — Ambulatory Visit (HOSPITAL_COMMUNITY)
Admission: RE | Admit: 2022-12-23 | Discharge: 2022-12-23 | Disposition: A | Discharge status: Home / Self Care | Payer: Medicare Other | Source: Ambulatory Visit | Attending: Family Medicine | Admitting: Family Medicine

## 2022-12-23 ENCOUNTER — Encounter (HOSPITAL_COMMUNITY): Payer: Self-pay

## 2022-12-23 ENCOUNTER — Other Ambulatory Visit (HOSPITAL_COMMUNITY): Payer: Self-pay | Admitting: Emergency Medicine

## 2022-12-23 VITALS — BP 96/68 | HR 68 | Wt 170.8 lb

## 2022-12-23 DIAGNOSIS — Z8249 Family history of ischemic heart disease and other diseases of the circulatory system: Secondary | ICD-10-CM | POA: Diagnosis not present

## 2022-12-23 DIAGNOSIS — Z7901 Long term (current) use of anticoagulants: Secondary | ICD-10-CM | POA: Insufficient documentation

## 2022-12-23 DIAGNOSIS — Z951 Presence of aortocoronary bypass graft: Secondary | ICD-10-CM | POA: Insufficient documentation

## 2022-12-23 DIAGNOSIS — Z950 Presence of cardiac pacemaker: Secondary | ICD-10-CM | POA: Insufficient documentation

## 2022-12-23 DIAGNOSIS — I493 Ventricular premature depolarization: Secondary | ICD-10-CM | POA: Diagnosis not present

## 2022-12-23 DIAGNOSIS — I5042 Chronic combined systolic (congestive) and diastolic (congestive) heart failure: Secondary | ICD-10-CM

## 2022-12-23 DIAGNOSIS — Z79899 Other long term (current) drug therapy: Secondary | ICD-10-CM | POA: Insufficient documentation

## 2022-12-23 DIAGNOSIS — J449 Chronic obstructive pulmonary disease, unspecified: Secondary | ICD-10-CM | POA: Insufficient documentation

## 2022-12-23 DIAGNOSIS — I2510 Atherosclerotic heart disease of native coronary artery without angina pectoris: Secondary | ICD-10-CM | POA: Diagnosis not present

## 2022-12-23 DIAGNOSIS — Z720 Tobacco use: Secondary | ICD-10-CM | POA: Diagnosis not present

## 2022-12-23 DIAGNOSIS — I5022 Chronic systolic (congestive) heart failure: Secondary | ICD-10-CM | POA: Insufficient documentation

## 2022-12-23 DIAGNOSIS — I10 Essential (primary) hypertension: Secondary | ICD-10-CM

## 2022-12-23 DIAGNOSIS — N1832 Chronic kidney disease, stage 3b: Secondary | ICD-10-CM | POA: Diagnosis not present

## 2022-12-23 DIAGNOSIS — I255 Ischemic cardiomyopathy: Secondary | ICD-10-CM | POA: Insufficient documentation

## 2022-12-23 DIAGNOSIS — F17210 Nicotine dependence, cigarettes, uncomplicated: Secondary | ICD-10-CM | POA: Insufficient documentation

## 2022-12-23 DIAGNOSIS — Z5986 Financial insecurity: Secondary | ICD-10-CM | POA: Diagnosis not present

## 2022-12-23 DIAGNOSIS — I4819 Other persistent atrial fibrillation: Secondary | ICD-10-CM

## 2022-12-23 DIAGNOSIS — I4821 Permanent atrial fibrillation: Secondary | ICD-10-CM | POA: Insufficient documentation

## 2022-12-23 DIAGNOSIS — I340 Nonrheumatic mitral (valve) insufficiency: Secondary | ICD-10-CM | POA: Diagnosis not present

## 2022-12-23 DIAGNOSIS — I130 Hypertensive heart and chronic kidney disease with heart failure and stage 1 through stage 4 chronic kidney disease, or unspecified chronic kidney disease: Secondary | ICD-10-CM | POA: Diagnosis not present

## 2022-12-23 DIAGNOSIS — E785 Hyperlipidemia, unspecified: Secondary | ICD-10-CM | POA: Diagnosis not present

## 2022-12-23 LAB — BASIC METABOLIC PANEL
Anion gap: 18 — ABNORMAL HIGH (ref 5–15)
BUN: 73 mg/dL — ABNORMAL HIGH (ref 8–23)
CO2: 19 mmol/L — ABNORMAL LOW (ref 22–32)
Calcium: 9.7 mg/dL (ref 8.9–10.3)
Chloride: 99 mmol/L (ref 98–111)
Creatinine, Ser: 1.95 mg/dL — ABNORMAL HIGH (ref 0.44–1.00)
GFR, Estimated: 26 mL/min — ABNORMAL LOW (ref >60)
Glucose, Bld: 107 mg/dL — ABNORMAL HIGH (ref 70–99)
Potassium: 3.4 mmol/L — ABNORMAL LOW (ref 3.5–5.1)
Sodium: 136 mmol/L (ref 135–145)

## 2022-12-23 LAB — CBC
HCT: 51.6 % — ABNORMAL HIGH (ref 36.0–46.0)
Hemoglobin: 17 g/dL — ABNORMAL HIGH (ref 12.0–15.0)
MCH: 28.9 pg (ref 26.0–34.0)
MCHC: 32.9 g/dL (ref 30.0–36.0)
MCV: 87.8 fL (ref 80.0–100.0)
Platelets: 169 10*3/uL (ref 150–400)
RBC: 5.88 MIL/uL — ABNORMAL HIGH (ref 3.87–5.11)
RDW: 16.7 % — ABNORMAL HIGH (ref 11.5–15.5)
WBC: 9.5 10*3/uL (ref 4.0–10.5)
nRBC: 0 % (ref 0.0–0.2)

## 2022-12-23 LAB — BRAIN NATRIURETIC PEPTIDE: B Natriuretic Peptide: 135.6 pg/mL — ABNORMAL HIGH (ref 0.0–100.0)

## 2022-12-23 MED ORDER — TORSEMIDE 20 MG PO TABS
40.0000 mg | ORAL_TABLET | Freq: Every day | ORAL | 8 refills | Status: DC
Start: 2022-12-23 — End: 2022-12-28

## 2022-12-23 MED ORDER — RIVAROXABAN 15 MG PO TABS
15.0000 mg | ORAL_TABLET | Freq: Every day | ORAL | 8 refills | Status: DC
Start: 2022-12-23 — End: 2022-12-23

## 2022-12-23 MED ORDER — RIVAROXABAN 15 MG PO TABS
15.0000 mg | ORAL_TABLET | Freq: Every day | ORAL | 8 refills | Status: DC
Start: 2022-12-23 — End: 2024-01-09

## 2022-12-23 NOTE — Patient Instructions (Addendum)
Thank you for coming in today  If you had labs drawn today, any labs that are abnormal the clinic will call you No news is good news  Medications:   Advanced Cardiac Care Program Guidelines for over-the-counter treatments for mild illnesses  Medications You can Use  Brand Name Generic Name Uses Comments  Benadryl Diphenhydramine Cold/allergy symptoms Do not use any Bendaryl products that say "congestion"  Loratadine Claritin Cold/allergy symptoms --  Coricidin HBP Varies with formulation Cold/flu  symptoms --  Mussinex Guaifenesin Mucus removal  --  Delsym Dextromethorphan Cough suppression --  Robitussin Dextromethorphan and guaifenesin Cough suppression + mucus removal Do not use robitussin products that include "PE", "PM," or "CF"  Vicks VapoRub Camphor, eucalyptus oil, menthol Cough and congestion  --  Vicks 44 Varies with formulation Cough suppression Do not use Vicks 44 D  Saline Nasal Spray Saline Congestion relief --   Medications to AVOID  Brand Name Generic Name  Alka-Seltzer Calcium carbonate + aspirin  Sudafed Pseudoephedrine  Comtrex Acetaminophen + pseudoephedrine  Theraflu Acetaminophen + phenylephrine  Dimetapp Brompheniramine + dextromethorphan + phenylephrine  Medicated nasal sprays Oxymetazoline, phenylephrine, etc.  Drixoral Dexbrompheniramine + pseudoephedrine  Chlor-Trimeton Chlorpheniramine  *AVOID AS THESE MEDICATIONS CAN CAUSE HIGH BLOOD PRESSURE AND HEART RATE  Common ingredients to avoid: pseudoephedrine, epinephrine, phenylephrine, brompheniramine, chlorpheniramine (ALWAYS READ LABLES CAREFULLY)  THIS IS NOT A COMPREHENSIVE LIST - If unsure about a medicine, PLEASE CALL AND ASK!    Follow up appointments:  Your physician recommends that you schedule a follow-up appointment in:  3 months with With Dr. Gala Romney    Do the following things EVERYDAY: Weigh yourself in the morning before breakfast. Write it down and keep it in a log. Take your  medicines as prescribed Eat low salt foods--Limit salt (sodium) to 2000 mg per day.  Stay as active as you can everyday Limit all fluids for the day to less than 2 liters   At the [facility], you and your health needs are our priority. As part of our continuing mission to provide you with exceptional heart care, we have created designated Provider Care Teams. These Care Teams include your primary Cardiologist (physician) and Advanced Practice Providers (APPs- Physician Assistants and Nurse Practitioners) who all work together to provide you with the care you need, when you need it.   You may see any of the following providers on your designated Care Team at your next follow up: Dr Arvilla Meres Dr Marca Ancona Dr. Marcos Eke, NP Robbie Lis, [place] [facility] [place], [place] Brynda Peon, NP Karle Plumber, PharmD   Please be sure to bring in all your medications bottles to every appointment.    Thank you for choosing [facility]  If you have any questions or concerns before your next appointment please send [place] a message through Friend[place] or call our office at [phone].    TO LEAVE A MESSAGE FOR THE NURSE SELECT OPTION 2, PLEASE LEAVE A MESSAGE INCLUDING: YOUR NAME DATE OF BIRTH CALL BACK NUMBER REASON FOR CALL**this is important as we prioritize the call backs  YOU WILL RECEIVE A CALL BACK THE SAME DAY AS LONG AS YOU CALL BEFORE 4:00 PM

## 2022-12-23 NOTE — Progress Notes (Signed)
Paramedicine Encounter   Patient ID: [name] [name] , female,   DOB: Jun 19, 1946,77 y.o.,  MRN: [id number]    Met patient in clinic today with provider.  Time spent with patient  Last visit 171lb. Reds Clip 34  Met Ms. Earlene Plater w/ provider [facility].  Shanda Bumps discussed with pt again regarding letting me do her pill box for her so that we would have a way to measure if the medicines are making a therapeutic effect or not.  She again agreed to allow me to reconcile a pill box for her at our next visit.  She is having labs today and I will monitor any med changes needed.    [name], EMT-Paramedic  [phone] 12/23/2022

## 2022-12-23 NOTE — Addendum Note (Signed)
Encounter addended by: Demetrius Charity, RN on: 12/23/2022 2:13 PM  Actions taken: Order list changed, Diagnosis association updated

## 2022-12-23 NOTE — Progress Notes (Signed)
ReDS Vest / Clip - 12/23/22 1300       ReDS Vest / Clip   Station Marker C    Ruler Value 25    ReDS Value Range Low volume    ReDS Actual Value 34

## 2022-12-26 ENCOUNTER — Telehealth: Payer: Self-pay

## 2022-12-26 DIAGNOSIS — N183 Chronic kidney disease, stage 3 unspecified: Secondary | ICD-10-CM

## 2022-12-26 DIAGNOSIS — Z0189 Encounter for other specified special examinations: Secondary | ICD-10-CM

## 2022-12-26 NOTE — Telephone Encounter (Signed)
-----   Message from [name], MD sent at 12/23/2022  8:36 PM EDT ----- Kidney function has worsened. Recheck in 6 weeks.

## 2022-12-26 NOTE — Telephone Encounter (Signed)
Pt called per message received from Dr. Ladona Ridgel.  Pt advised per the 12/23/2022 lab draw results, that Dr. Ladona Ridgel advises a kidney function recheck in 6 weeks, as her kidney function has worsened.    Pt stated was frustrated about her procedure being cancelled, and had a difficult time understanding my lab result education and follow up orders.  I explained Dr. Lubertha Basque plan of are again, in regards to the 12/23/2022 lab results, Pt verbalized understanding, and stated it was ok to schedule this for June 24 or 25, Per Dr. Lubertha Basque 6 week request.       Pt also stated she has had to use suppositories to help her have BM's.  Pt educated on BM approach, but ultimately advised to reach out to her PCP, Dr. Sanda Linger for follow up care for this assessment and concern.  Pt verbalized that she will reach out to his office for follow up.    Lab orders entered for 6 week follow up BMET lab draw at [facility] on [address].   ====================================  Orders seen with lab results per Prince Rome, FNP;  Pt called back and advised per Ms. Wende Mott FNP, K is low / kidney function remains elevated.    Pt advised / took notes, and wrote down these instructions per RN:   Hold torsemide and losartan x 3 days, then on 5/16, resume torsemide at 20 mg daily (lower dose, half dose) and losartan 25 mg daily (lower dose, half dose).  Then Pt advised to return to [facility] on [address] for repeat BMET in 1 week, or Monday 01/02/2023 for reassessment.    Pt verbalized understanding to Ms. Wende Mott FNP plan of care.  Orders for BMET entered, and scheduled.

## 2022-12-27 ENCOUNTER — Telehealth (HOSPITAL_COMMUNITY): Payer: Self-pay | Admitting: Cardiology

## 2022-12-27 ENCOUNTER — Other Ambulatory Visit (HOSPITAL_COMMUNITY): Payer: Self-pay | Admitting: Emergency Medicine

## 2022-12-27 ENCOUNTER — Ambulatory Visit: Payer: Medicare Other | Attending: Cardiovascular Disease | Admitting: Cardiovascular Disease

## 2022-12-27 ENCOUNTER — Other Ambulatory Visit (HOSPITAL_COMMUNITY): Payer: Self-pay | Admitting: Family Medicine

## 2022-12-27 NOTE — Progress Notes (Signed)
Paramedicine Encounter    Patient ID: [name] [name], female    DOB: 09/17/1945, 77 y.o.   MRN: [id number]   Complaints Productive cough  Assessment A&O x 4, Lung sounds diminished in upper w/ ronchi and absent in the bases  Compliance with meds-  not completely sure as she take from bottles.  She discontinued her Torsemide and Losartan on Monday  Pill box filled x 1 week  Refills needed Metformin, Gabapentin, Atorvastatin  Meds changes since last visit discontinued Torsemide and Losartan x 3 days.     Social changes none  BP (!) 140/90 (BP Location: Left Arm, Patient Position: Sitting, Cuff Size: Normal)   Pulse 70   Resp 16   Wt 178 lb (80.7 kg)   SpO2 99%   BMI 27.47 kg/m  Weight yesterday- none Last visit weight-170lb  Initially pt did not want to be seen today but I persuaded her to let me come late this afternoon.  Today pt allowed me to fill her pill box for the week. This reflected med changes.  Pt. Found to be up 8lbs since 5/10.  Contacted [facility] triage regarding same and received orders to go ahead and start Torsemide back today and continue to hold Losartan 25mg . Till 5/16.  Pt. Is to have follow up labs in 10 days.   ACTION: Home visit completed  [name] [phone] 12/27/22  Patient Care Team: [name], MD as PCP - General (Internal Medicine) [name], MD as PCP - Cardiology (Cardiology) [name], MD as PCP - Electrophysiology (Cardiology) Chrystie Nose, MD as Consulting Physician (Cardiology)  Patient Active Problem List   Diagnosis Date Noted   Type 2 diabetes mellitus with hyperlipidemia (HCC) 12/14/2022   Acute cough 12/12/2022   SOB (shortness of breath) 12/12/2022   Acute on chronic systolic CHF (congestive heart failure) (HCC) 12/12/2022   Ceruminosis, bilateral 06/17/2022   Flu vaccine need 06/17/2022   Chronic combined systolic and diastolic CHF (congestive heart failure) (HCC) 06/17/2022    Encounter for general adult medical examination with abnormal findings 06/16/2022   Chronic anticoagulation 06/06/2022   Mitral regurgitation 06/06/2022   Controlled type 2 diabetes mellitus without complication, without long-term current use of insulin (HCC) 06/06/2022   Pacemaker 02/22/2022   Hypertensive urgency 06/08/2021   Obstructive sleep apnea 09/21/2020   Obesity (BMI 30.0-34.9) 02/10/2020   COPD (chronic obstructive pulmonary disease) (HCC) 10/26/2019   Thrombocytopenia (HCC) 09/14/2017   Medication noncompliance due to cognitive impairment 09/06/2017   Persistent atrial fibrillation (HCC) 08/04/2017   Cardiomyopathy, ischemic 02/08/2017   CKD (chronic kidney disease), stage III (HCC) 01/30/2017   Coronary artery disease due to lipid rich plaque    Essential hypertension     Current Outpatient Medications:    acetaminophen (TYLENOL) 325 MG tablet, Take 2 tablets (650 mg total) by mouth every 4 (four) hours as needed for headache or mild pain., Disp: , Rfl:    allopurinol (ZYLOPRIM) 100 MG tablet, Take 100 mg by mouth daily., Disp: , Rfl:    atorvastatin (LIPITOR) 10 MG tablet, Take 1 tablet (10 mg total) by mouth at bedtime., Disp: 90 tablet, Rfl: 1   gabapentin (NEURONTIN) 100 MG capsule, Take 1 capsule (100 mg total) by mouth at bedtime., Disp: 90 capsule, Rfl: 0   hydrALAZINE (APRESOLINE) 25 MG tablet, Take 2 tablets (50 mg total) by mouth 3 (three) times daily., Disp: 180 tablet, Rfl: 1   losartan (COZAAR) 50 MG tablet, Take  1 tablet (50 mg total) by mouth daily., Disp: 30 tablet, Rfl: 6   metFORMIN (GLUCOPHAGE) 500 MG tablet, TAKE 1 TABLET (500 MG TOTAL) BY MOUTH DAILY WITH BREAKFAST (MEAL) (Patient taking differently: Take 500 mg by mouth daily with breakfast.), Disp: 90 tablet, Rfl: 0   Naphazoline HCl (CLEAR EYES OP), Place 1 drop into both eyes daily as needed (irritation)., Disp: , Rfl:    pantoprazole (PROTONIX) 40 MG tablet, Take 1 tablet (40 mg total) by mouth daily.  Follow-up appt due in Marcht must see provider for future refills, Disp: 90 tablet, Rfl: 0   Rivaroxaban (XARELTO) 15 MG TABS tablet, Take 1 tablet (15 mg total) by mouth daily with supper., Disp: 30 tablet, Rfl: 8   spironolactone (ALDACTONE) 25 MG tablet, Take 1 tablet (25 mg total) by mouth daily., Disp: 30 tablet, Rfl: 11   torsemide (DEMADEX) 20 MG tablet, Take 2 tablets (40 mg total) by mouth daily., Disp: 60 tablet, Rfl: 8   TRELEGY ELLIPTA 100-62.5-25 MCG/ACT AEPB, Inhale ONE PUFF into THE lungs daily (Patient taking differently: As needed), Disp: 120 each, Rfl: 1   sucralfate (CARAFATE) 1 GM/10ML suspension, Take 10 mLs (1 g total) by mouth 4 (four) times daily -  with meals and at bedtime. (Patient not taking: Reported on 12/27/2022), Disp: 420 mL, Rfl: 0 Allergies  Allergen Reactions   Bee Venom Anaphylaxis   Ivp Dye [Iodinated Contrast Media] Anaphylaxis   Ace Inhibitors     angioedema   Atenolol     severe headaches   Codeine Nausea And Vomiting   Entresto [Sacubitril-Valsartan] Other (See Comments)    Chest pain    Isosorbide     Severe headaches   Shrimp [Shellfish Allergy] Swelling   Simvastatin     not sure   Jardiance [Empagliflozin] Other (See Comments)    Caused Boils    Penicillin G Rash     Social History   Socioeconomic History   Marital status: Widowed    Spouse name: Not on file   Number of children: 1   Years of education: 26   Highest education level: Not on file  Occupational History   Occupation: RETIRED [employer]  Tobacco Use   Smoking status: Former    Packs/day: 0.50    Years: 56.00    Additional pack years: 0.00    Total pack years: 28.00    Types: Cigarettes    Quit date: 11/13/2021    Years since quitting: 1.1   Smokeless tobacco: Never   Tobacco comments:    smoking 2 cigarettes/day as of 10/23/20  Vaping Use   Vaping Use: Never used  Substance and Sexual Activity   Alcohol use: No    Alcohol/week: 0.0 standard drinks  of alcohol   Drug use: No   Sexual activity: Not on file  Other Topics Concern   Not on file  Social History Narrative   Patient reports it being difficult to pay for everything due to outstanding medical bills from hospital stay last year but states she is able to keep up with basic expenses.  Patient does have some concerns with her house's condition due to a water leak- had her roof repaired last month but has some leaking in the front which has affected her porch.  Patient owns her own car and is able to drive herself but has some concerns about the reliability of her car- has gotten taxi to the clinic for appointment in the past when her  car broke down.   Social Determinants of Health   Financial Resource Strain: Low Risk  (06/07/2018)   Overall Financial Resource Strain (CARDIA)    Difficulty of Paying Living Expenses: Not hard at all  Recent Concern: Financial Resource Strain - Medium Risk (05/21/2018)   Overall Financial Resource Strain (CARDIA)    Difficulty of Paying Living Expenses: Somewhat hard  Food Insecurity: No Food Insecurity (12/15/2022)   Hunger Vital Sign    Worried About Running Out of Food in the Last Year: Never true    Ran Out of Food in the Last Year: Never true  Transportation Needs: No Transportation Needs (12/15/2022)   PRAPARE - Administrator, Civil Service (Medical): No    Lack of Transportation (Non-Medical): No  Physical Activity: Not on file  Stress: Not on file  Social Connections: Not on file  Intimate Partner Violence: Not At Risk (06/09/2022)   Humiliation, Afraid, Rape, and Kick questionnaire    Fear of Current or Ex-Partner: No    Emotionally Abused: No    Physically Abused: No    Sexually Abused: No    Physical Exam      Future Appointments  Date Time Provider Department Center  01/02/2023  2:30 PM [facility]  01/12/2023 11:00 AM [name], MD [facility]  01/17/2023  7:00 AM  [facility] DEVICE REMOTES [facility]  02/06/2023  1:15 PM [facility]  03/07/2023  2:00 PM [name], MD [facility]  04/06/2023  1:00 PM [facility] 1 [facility]  04/06/2023  2:00 PM Bensimhon, [name], MD [facility] None  04/18/2023  7:05 AM [facility] DEVICE REMOTES [facility]  07/18/2023  7:00 AM [facility] DEVICE REMOTES [facility]  10/17/2023  7:00 AM [facility] DEVICE REMOTES [facility]

## 2022-12-27 NOTE — Telephone Encounter (Signed)
Dee dee with para medicine called during pt home visit with concerns of increased weight ( up 8 lbs in 4 days) mild edema, b/p 140/90. Multiple medications held secondary to kidney functions ( see lab results 5/10)   Above reviewed with Mikki Santee Ok to restart torsemide now Continue to hold losartan until 5/16 Be sure to repeat labs 5/20   [facility] with para medicine aware and voiced understanding  -note will restart medications at decreased dose as instructed with 5/10 lab results

## 2022-12-28 ENCOUNTER — Telehealth (HOSPITAL_COMMUNITY): Payer: Self-pay | Admitting: *Deleted

## 2022-12-28 DIAGNOSIS — I2510 Atherosclerotic heart disease of native coronary artery without angina pectoris: Secondary | ICD-10-CM

## 2022-12-28 DIAGNOSIS — I5022 Chronic systolic (congestive) heart failure: Secondary | ICD-10-CM

## 2022-12-28 MED ORDER — TORSEMIDE 20 MG PO TABS
20.0000 mg | ORAL_TABLET | Freq: Every day | ORAL | 3 refills | Status: DC
Start: 2022-12-28 — End: 2023-11-28

## 2022-12-28 NOTE — Telephone Encounter (Signed)
Called patient cell and left message asking her to call us with lab results and instructions.   Called [name] (Paramedicine) per Shanda Bumps with following:   "K is low and kidney function remains elevated.  Hold torsemide and losartan x 3 days, then resume torsemide at 20 mg daily (lower dose) and losartan 25 mg daily (lower dose). Repeat BMET in 1 week"   [name] says she saw patient yesterday and she had fluid on board. She call [facility] with following instructions: Start torsemide 20 mg daily and re-start Losartan on 12/29/22. She updated patient pillbox. Cancelled this Friday lab appointment and re-scheduled for 01/06/23. [name] will share this info with patient.

## 2022-12-29 ENCOUNTER — Ambulatory Visit (INDEPENDENT_AMBULATORY_CARE_PROVIDER_SITE_OTHER): Payer: Medicare Other | Admitting: Internal Medicine

## 2022-12-29 ENCOUNTER — Ambulatory Visit (INDEPENDENT_AMBULATORY_CARE_PROVIDER_SITE_OTHER): Payer: Medicare Other

## 2022-12-29 VITALS — BP 124/78 | HR 59 | Temp 98.1°F | Ht 67.5 in | Wt 175.0 lb

## 2022-12-29 DIAGNOSIS — R059 Cough, unspecified: Secondary | ICD-10-CM | POA: Diagnosis not present

## 2022-12-29 DIAGNOSIS — I255 Ischemic cardiomyopathy: Secondary | ICD-10-CM

## 2022-12-29 DIAGNOSIS — R300 Dysuria: Secondary | ICD-10-CM | POA: Diagnosis not present

## 2022-12-29 DIAGNOSIS — R058 Other specified cough: Secondary | ICD-10-CM | POA: Insufficient documentation

## 2022-12-29 DIAGNOSIS — I4819 Other persistent atrial fibrillation: Secondary | ICD-10-CM

## 2022-12-29 LAB — POC URINALSYSI DIPSTICK (AUTOMATED)
Bilirubin, UA: NEGATIVE
Blood, UA: NEGATIVE
Glucose, UA: NEGATIVE
Ketones, UA: NEGATIVE
Leukocytes, UA: NEGATIVE
Nitrite, UA: NEGATIVE
Protein, UA: POSITIVE — AB
Spec Grav, UA: 1.02 (ref 1.010–1.025)
Urobilinogen, UA: 0.2 E.U./dL
pH, UA: 6 (ref 5.0–8.0)

## 2022-12-29 NOTE — Progress Notes (Signed)
Subjective:  Patient ID: [name] [name], female    DOB: 1946/05/07  Age: 77 y.o. MRN: [id number]  CC: Cough and COPD   HPI [name] [name] presents for f/up ---  She complains of a 2-day history of dysuria.  She denies hematuria, urgency, or frequency.  She also complains of a persistent cough productive of yellow mucus.  Outpatient Medications Prior to Visit  Medication Sig Dispense Refill   acetaminophen (TYLENOL) 325 MG tablet Take 2 tablets (650 mg total) by mouth every 4 (four) hours as needed for headache or mild pain.     allopurinol (ZYLOPRIM) 100 MG tablet Take 100 mg by mouth daily.     atorvastatin (LIPITOR) 10 MG tablet Take 1 tablet (10 mg total) by mouth at bedtime. 90 tablet 1   gabapentin (NEURONTIN) 100 MG capsule Take 1 capsule (100 mg total) by mouth at bedtime. 90 capsule 0   hydrALAZINE (APRESOLINE) 25 MG tablet Take 2 tablets (50 mg total) by mouth 3 (three) times daily. 180 tablet 1   losartan (COZAAR) 50 MG tablet Take 1 tablet (50 mg total) by mouth daily. 30 tablet 6   metFORMIN (GLUCOPHAGE) 500 MG tablet TAKE 1 TABLET (500 MG TOTAL) BY MOUTH DAILY WITH BREAKFAST (MEAL) (Patient taking differently: Take 500 mg by mouth daily with breakfast.) 90 tablet 0   Naphazoline HCl (CLEAR EYES OP) Place 1 drop into both eyes daily as needed (irritation).     pantoprazole (PROTONIX) 40 MG tablet Take 1 tablet (40 mg total) by mouth daily. Follow-up appt due in Marcht must see provider for future refills 90 tablet 0   Rivaroxaban (XARELTO) 15 MG TABS tablet Take 1 tablet (15 mg total) by mouth daily with supper. 30 tablet 8   spironolactone (ALDACTONE) 25 MG tablet Take 1 tablet (25 mg total) by mouth daily. 30 tablet 11   torsemide (DEMADEX) 20 MG tablet Take 1 tablet (20 mg total) by mouth daily. 90 tablet 3   sucralfate (CARAFATE) 1 GM/10ML suspension Take 10 mLs (1 g total) by mouth 4 (four) times daily -  with meals and at bedtime. (Patient not taking: Reported on 12/27/2022)  420 mL 0   TRELEGY ELLIPTA 100-62.5-25 MCG/ACT AEPB Inhale ONE PUFF into THE lungs daily (Patient not taking: Reported on 12/29/2022) 120 each 1   No facility-administered medications prior to visit.    ROS Review of Systems  Constitutional: Negative.  Negative for diaphoresis and fatigue.  HENT: Negative.    Eyes: Negative.   Respiratory:  Positive for cough and shortness of breath. Negative for chest tightness and wheezing.   Cardiovascular:  Negative for chest pain, palpitations and leg swelling.  Gastrointestinal:  Negative for abdominal pain, constipation, diarrhea, nausea and vomiting.  Endocrine: Negative.   Genitourinary:  Positive for dysuria. Negative for decreased urine volume, difficulty urinating, hematuria and urgency.  Musculoskeletal: Negative.   Skin: Negative.   Neurological: Negative.  Negative for dizziness and weakness.  Hematological:  Negative for adenopathy. Does not bruise/bleed easily.  Psychiatric/Behavioral: Negative.      Objective:  BP 124/78 (BP Location: Left Arm, Patient Position: Sitting, Cuff Size: Normal)   Pulse (!) 59   Temp 98.1 F (36.7 C) (Oral)   Ht 5' 7.5" (1.715 m)   Wt 175 lb (79.4 kg)   SpO2 98%   BMI 27.00 kg/m   BP Readings from Last 3 Encounters:  12/29/22 124/78  12/27/22 (!) 140/90  12/23/22 96/68    Wt Readings  from Last 3 Encounters:  12/29/22 175 lb (79.4 kg)  12/27/22 178 lb (80.7 kg)  12/23/22 170 lb 12.8 oz (77.5 kg)    Physical Exam Vitals reviewed.  Constitutional:      General: She is not in acute distress.    Appearance: She is ill-appearing. She is not toxic-appearing or diaphoretic.  HENT:     Nose: Nose normal.     Mouth/Throat:     Mouth: Mucous membranes are moist.  Eyes:     General: No scleral icterus.    Conjunctiva/sclera: Conjunctivae normal.  Cardiovascular:     Rate and Rhythm: Normal rate and regular rhythm. Occasional Extrasystoles are present.    Heart sounds:     Gallop present.   Pulmonary:     Effort: Pulmonary effort is normal.     Breath sounds: No stridor. No wheezing, rhonchi or rales.  Abdominal:     General: Abdomen is flat.     Palpations: There is no mass.     Tenderness: There is no abdominal tenderness. There is no guarding.     Hernia: No hernia is present.  Musculoskeletal:        General: Normal range of motion.     Cervical back: Neck supple.     Right lower leg: No edema.     Left lower leg: No edema.  Lymphadenopathy:     Cervical: No cervical adenopathy.  Skin:    General: Skin is warm and dry.     Coloration: Skin is not pale.  Neurological:     General: No focal deficit present.     Mental Status: She is alert. Mental status is at baseline.  Psychiatric:        Mood and Affect: Mood normal.        Behavior: Behavior normal.     Lab Results  Component Value Date   WBC 9.5 12/23/2022   HGB 17.0 (H) 12/23/2022   HCT 51.6 (H) 12/23/2022   PLT 169 12/23/2022   GLUCOSE 107 (H) 12/23/2022   CHOL 109 03/16/2022   TRIG 123.0 03/16/2022   HDL 30.20 (L) 03/16/2022   LDLCALC 54 03/16/2022   ALT 16 12/14/2022   AST 21 12/14/2022   NA 136 12/23/2022   K 3.4 (L) 12/23/2022   CL 99 12/23/2022   CREATININE 1.95 (H) 12/23/2022   BUN 73 (H) 12/23/2022   CO2 19 (L) 12/23/2022   TSH 1.42 03/16/2022   INR 1.6 (H) 12/08/2020   HGBA1C 6.3 12/12/2022   MICROALBUR 238.9 (H) 03/16/2022   DG Chest 2 View  Result Date: 12/29/2022 CLINICAL DATA:  cough ---->>>>> phlegm EXAM: CHEST - 2 VIEW COMPARISON:  December 12, 2022 FINDINGS: The cardiomediastinal silhouette is unchanged in contour.Status post median sternotomy and CABG as well as atrial appendage clipping. LEFT chest cardiac pacing device. No pleural effusion. No pneumothorax. LEFT mid lung scar. No acute pleuroparenchymal abnormality. Visualized abdomen is unremarkable. Multilevel degenerative changes of the thoracic spine. IMPRESSION: No acute cardiopulmonary abnormality. Electronically Signed    By: Meda Klinefelter M.D.   On: 12/29/2022 16:10   ECHOCARDIOGRAM COMPLETE  Result Date: 12/13/2022    ECHOCARDIOGRAM REPORT   Patient Name:   [name] [name] Date of Exam: 12/13/2022 Medical Rec #:  [id number]     Height:       67.5 in Accession #:    [id number]    Weight:       179.0 lb Date of Birth:  [birth year]  BSA:          1.940 m Patient Age:    77 years      BP:           133/85 mmHg Patient Gender: F             HR:           54 bpm. Exam Location:  Inpatient Procedure: 2D Echo, Cardiac Doppler and Color Doppler Indications:    CHF-Acute Systolic I50.21  History:        Patient has prior history of Echocardiogram examinations. CHF                 and Cardiomyopathy, CAD, Prior CABG and Pacemaker, Carotid                 Disease, Mitral Valve Disease; Risk Factors:Hypertension,                 Diabetes and Former Smoker.  Sonographer:    Aron Baba Referring Phys: [id number] VASUNDHRA RATHORE  Sonographer Comments: Suboptimal parasternal window. Image acquisition challenging due to patient body habitus, Image acquisition challenging due to respiratory motion and Image acquisition challenging due to COPD. IMPRESSIONS  1. Left ventricular ejection fraction, by estimation, is 25%. The left ventricle has severely decreased function. The left ventricle demonstrates global hypokinesis. There is mild concentric left ventricular hypertrophy. Left ventricular diastolic parameters are indeterminate.  2. Right ventricular systolic function is moderately reduced. The right ventricular size is is dilated. There is moderately elevated pulmonary artery systolic pressure. The estimated right ventricular systolic pressure is 55.2 mmHg.  3. Left atrial size was severely dilated.  4. Right atrial size was severely dilated.  5. The mitral valve is abnormal. Moderate mitral valve regurgitation.  6. Tricuspid valve regurgitation is mild to moderate.  7. The aortic valve is tricuspid. Aortic valve regurgitation is not  visualized.  8. The inferior vena cava is dilated in size with <50% respiratory variability, suggesting right atrial pressure of 15 mmHg. Comparison(s): RVSP increased, RV dilation is better characterized. FINDINGS  Left Ventricle: Left ventricular ejection fraction, by estimation, is 25%. The left ventricle has severely decreased function. The left ventricle demonstrates global hypokinesis. The left ventricular internal cavity size was normal in size. There is mild concentric left ventricular hypertrophy. Left ventricular diastolic parameters are indeterminate. Right Ventricle: The right ventricular size is is dilated. No increase in right ventricular wall thickness. Right ventricular systolic function is moderately reduced. There is moderately elevated pulmonary artery systolic pressure. The tricuspid regurgitant velocity is 3.17 m/s, and with an assumed right atrial pressure of 15 mmHg, the estimated right ventricular systolic pressure is 55.2 mmHg. Left Atrium: Left atrial size was severely dilated. Right Atrium: Right atrial size was severely dilated. Pericardium: There is no evidence of pericardial effusion. Mitral Valve: The mitral valve is abnormal. Moderate mitral valve regurgitation. MV peak gradient, 41.9 mmHg. The mean mitral valve gradient is 4.0 mmHg. Tricuspid Valve: The tricuspid valve is grossly normal. Tricuspid valve regurgitation is mild to moderate. No evidence of tricuspid stenosis. Aortic Valve: The aortic valve is tricuspid. There is mild aortic valve annular calcification. Aortic valve regurgitation is not visualized. Pulmonic Valve: The pulmonic valve was not well visualized. Pulmonic valve regurgitation is not visualized. Aorta: The aortic root and ascending aorta are structurally normal, with no evidence of dilitation. Venous: The inferior vena cava is dilated in size with less than 50% respiratory variability, suggesting right atrial pressure of 15  mmHg. IAS/Shunts: No atrial level shunt  detected by color flow Doppler. Additional Comments: A device lead is visualized in the right ventricle and right atrium.  LEFT VENTRICLE PLAX 2D LVIDd:         5.50 cm      Diastology LVIDs:         4.70 cm      LV e' medial:    2.61 cm/s LV PW:         1.10 cm      LV E/e' medial:  33.5 LV IVS:        1.20 cm      LV e' lateral:   4.88 cm/s LVOT diam:     2.00 cm      LV E/e' lateral: 17.9 LV SV:         24 LV SV Index:   12 LVOT Area:     3.14 cm  LV Volumes (MOD) LV vol d, MOD A2C: 130.5 ml LV vol d, MOD A4C: 149.0 ml LV vol s, MOD A2C: 99.1 ml LV vol s, MOD A4C: 98.4 ml LV SV MOD A2C:     31.4 ml LV SV MOD A4C:     149.0 ml LV SV MOD BP:      38.4 ml RIGHT VENTRICLE RV S prime:     5.22 cm/s TAPSE (M-mode): 1.2 cm LEFT ATRIUM              Index        RIGHT ATRIUM           Index LA diam:        5.00 cm  2.58 cm/m   RA Area:     35.70 cm LA Vol (A2C):   165.0 ml 85.06 ml/m  RA Volume:   141.00 ml 72.69 ml/m LA Vol (A4C):   128.0 ml 65.99 ml/m LA Biplane Vol: 149.0 ml 76.81 ml/m  AORTIC VALVE LVOT Vmax:   60.10 cm/s LVOT Vmean:  32.400 cm/s LVOT VTI:    0.077 m  AORTA Ao Root diam: 3.00 cm Ao Asc diam:  3.80 cm MITRAL VALVE               TRICUSPID VALVE MV Area (PHT): 4.21 cm    TR Peak grad:   40.2 mmHg MV Area VTI:   0.25 cm    TR Vmax:        317.00 cm/s MV Peak grad:  41.9 mmHg MV Mean grad:  4.0 mmHg    SHUNTS MV Vmax:       3.24 m/s    Systemic VTI:  0.08 m MV Vmean:      214.2 cm/s  Systemic Diam: 2.00 cm MV Decel Time: 180 msec MR Peak grad: 126.6 mmHg MR Mean grad: 67.0 mmHg MR Vmax:      562.50 cm/s MR Vmean:     373.0 cm/s MV E velocity: 87.40 cm/s MV A velocity: 21.30 cm/s MV E/A ratio:  4.10 Riley Lam MD Electronically signed by Riley Lam MD Signature Date/Time: 12/13/2022/5:36:03 PM    Final    DG Chest 2 View  Result Date: 12/12/2022 CLINICAL DATA:  Cough, shortness of breath EXAM: CHEST - 2 VIEW COMPARISON:  11/21/2022 FINDINGS: Transverse diameter of heart is  increased. Central pulmonary vessels are more prominent. There are no signs of alveolar pulmonary edema. Linear density in left parahilar region may suggest scarring or subsegmental atelectasis. There is no focal pulmonary consolidation. There is no  pleural effusion or pneumothorax. Patient's chin is partially obscuring the right apex. Pacemaker battery is seen in the left infraclavicular region. There is a metallic clamp in the region of left atrial appendage. IMPRESSION: Cardiomegaly. Central pulmonary vessels are more prominent suggesting mild CHF. There is no focal pulmonary consolidation or signs of alveolar pulmonary edema. Electronically Signed   By: Ernie Avena M.D.   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 Lewayne Bunting, MD   DG Chest Port 1 View  Result Date: 11/21/2022 CLINICAL DATA:  Pacemaker EXAM: PORTABLE CHEST 1 VIEW COMPARISON:  CXR 09/05/22 FINDINGS: Left-sided single lead pacemaker in place with a lead in the right ventricle, unchanged. Status post median sternotomy. Unchanged enlarged cardiac contours. Prominent bilateral interstitial opacities could represent atypical infection or pulmonary venous congestion. No pleural effusion. No pneumothorax. No radiographically apparent displaced rib fractures. Visualized upper abdomen is unremarkable. IMPRESSION: Left-sided single lead pacemaker in place with a lead in the right ventricle, unchanged. Electronically Signed   By: Lorenza Cambridge M.D.   On: 11/21/2022 15:33   VAS [place] ABI WITH/WO TBI  Result Date: 11/10/2022  LOWER EXTREMITY DOPPLER STUDY Patient Name:  SHANINE BAZEN  Date of Exam:   11/09/2022 Medical Rec #: [id number]      Accession #:    [id number] Date of Birth: 03-Sep-1945      Patient Gender: F Patient Age:   10 years Exam Location:  [facility] Procedure:      VAS [place] ABI  WITH/WO TBI Referring Phys: Ovid Curd --------------------------------------------------------------------------------  Indications: Peripheral artery disease, and CT 12/23/21: Extensive aortoiliac              atherosclerotic. High Risk Factors: Hypertension, Diabetes, coronary artery disease.  Comparison Study: prior 02/11/22 RT 0.58 LT 0.73 Performing Technologist: [place] Ponder RVS  Examination Guidelines: A complete evaluation includes at minimum, Doppler waveform signals and systolic blood pressure reading at the level of bilateral brachial, anterior tibial, and posterior tibial arteries, when vessel segments are accessible. Bilateral testing is considered an integral part of a complete examination. Photoelectric Plethysmograph (PPG) waveforms and toe systolic pressure readings are included as required and additional duplex testing as needed. Limited examinations for reoccurring indications may be performed as noted.  ABI Findings: +---------+------------------+-----+----------+--------+ Right    Rt Pressure (mmHg)IndexWaveform  Comment  +---------+------------------+-----+----------+--------+ Brachial 171                                       +---------+------------------+-----+----------+--------+ PTA      92                0.54 monophasic         +---------+------------------+-----+----------+--------+ DP       95                0.56 monophasic         +---------+------------------+-----+----------+--------+ Great Toe85                0.50 Abnormal           +---------+------------------+-----+----------+--------+ +---------+------------------+-----+----------+-------+ Left     Lt Pressure (mmHg)IndexWaveform  Comment +---------+------------------+-----+----------+-------+ Brachial 160                                      +---------+------------------+-----+----------+-------+  PTA      88                0.51 monophasic         +---------+------------------+-----+----------+-------+ DP       83                0.49 monophasic        +---------+------------------+-----+----------+-------+ Great Toe68                0.40 Abnormal          +---------+------------------+-----+----------+-------+ +-------+-----------+-----------+------------+------------+ ABI/TBIToday's ABIToday's TBIPrevious ABIPrevious TBI +-------+-----------+-----------+------------+------------+ Right  0.56       0.50       0.58        0.39         +-------+-----------+-----------+------------+------------+ Left   0.51       0.40       0.73        0.42         +-------+-----------+-----------+------------+------------+  Right ABIs appear essentially unchanged compared to prior study on 02/11/22. Left ABIs appear decreased compared to prior study on 02/11/22.  Summary: Right: Resting right ankle-brachial index indicates moderate right lower extremity arterial disease. The right toe-brachial index is abnormal. Left: Resting left ankle-brachial index indicates moderate left lower extremity arterial disease. The left toe-brachial index is abnormal. *See table(s) above for measurements and observations.  Electronically signed by Waverly Ferrari MD on 11/10/2022 at 8:28:47 AM.    Final    CUP PACEART REMOTE DEVICE CHECK  Result Date: 10/19/2022 Scheduled remote reviewed. Normal device function.  Next remote 91 days. LA    No results found.   Assessment & Plan:   Dysuria- Her UA and urine culture are normal. -     CULTURE, URINE COMPREHENSIVE; Future -     POCT Urinalysis Dipstick (Automated)  Cough productive of purulent sputum- Chest x-ray is negative for mass or infiltrate. -     DG Chest 2 View  Cardiomyopathy, ischemic- Her blood pressure is well-controlled and she is euvolemic.  Persistent atrial fibrillation (HCC)- She has good rate and rhythm control.     Follow-up: No follow-ups on file.  Sanda Linger, MD

## 2023-01-01 ENCOUNTER — Encounter: Payer: Self-pay | Admitting: Internal Medicine

## 2023-01-01 LAB — CULTURE, URINE COMPREHENSIVE

## 2023-01-02 ENCOUNTER — Other Ambulatory Visit: Payer: Medicare Other

## 2023-01-04 ENCOUNTER — Telehealth (HOSPITAL_COMMUNITY): Payer: Self-pay | Admitting: Emergency Medicine

## 2023-01-04 NOTE — Telephone Encounter (Signed)
Called [facility] to advise them that pt. Is changing to [facility] on [address] and requested them to transfer PCP prescriptions to this location.    [name], EMT-Paramedic [phone] 01/04/2023

## 2023-01-05 ENCOUNTER — Other Ambulatory Visit (HOSPITAL_COMMUNITY): Payer: Self-pay | Admitting: Emergency Medicine

## 2023-01-05 ENCOUNTER — Telehealth (HOSPITAL_COMMUNITY): Payer: Self-pay | Admitting: Emergency Medicine

## 2023-01-05 MED ORDER — LOSARTAN POTASSIUM 50 MG PO TABS: 25.0000 mg | ORAL_TABLET | Freq: Every day | ORAL | 6 refills | Status: DC

## 2023-01-05 NOTE — Addendum Note (Signed)
Addended by: Noralee Space on: 01/05/2023 01:42 PM   Modules accepted: Orders

## 2023-01-05 NOTE — Progress Notes (Signed)
Paramedicine Encounter    Patient ID: [name] [name], female    DOB: May 04, 1946, 77 y.o.   MRN: [id number]   Complaints Productive cough and problems going to the bathroom  Assessment A&O x 4, skin W&D w/ good color. Lung sounds w/ ronchi in the upper lobes w/ productive cough. Afebrile.  Compliance with meds missed 1 Torsemide out of whole week  Pill box filled x 1 week  Refills needed Gabapentin  Meds changes since last visit NONE    Social changes NONE   BP 130/80 (BP Location: Left Arm, Patient Position: Sitting, Cuff Size: Normal)   Pulse 60   Resp 16   Wt 172 lb (78 kg)   SpO2 97%   BMI 26.54 kg/m  Weight yesterday-not taken Last visit weight-178lb  Super proud of [name] [name] today.  She took a whole weeks worth of her meds from her pill box missing only 1 Torsemide out of the whole week.   She complains of not being able to go to the bathroom and specifically blames it on her ICD although I assured her this was most likely not the case.    Refilled her med box x 1  week.  Refill [facility]entin [facility] will be ready for pick up tomorrow. I confirmed with [name] [name] that I reached out to [facility] to confirm that [name] [name] requests to [facility] [address]  Heart meds have already been moved there and I requested all other meds be transferred to [facility] as well.  ACTION: Home visit completed  [name] [phone] 01/05/23  Patient Care Team: [name], MD as PCP - General (Internal Medicine) [name], MD as PCP - Cardiology (Cardiology) [name], MD as PCP - Electrophysiology (Cardiology) Chrystie Nose, MD as Consulting Physician (Cardiology)  Patient Active Problem List   Diagnosis Date Noted   Dysuria 12/29/2022   Cough productive of purulent sputum 12/29/2022   Type 2 diabetes mellitus with hyperlipidemia (HCC) 12/14/2022   Acute cough 12/12/2022   Ceruminosis, bilateral 06/17/2022    Flu vaccine need 06/17/2022   Chronic combined systolic and diastolic CHF (congestive heart failure) (HCC) 06/17/2022   Encounter for general adult medical examination with abnormal findings 06/16/2022   Chronic anticoagulation 06/06/2022   Mitral regurgitation 06/06/2022   Controlled type 2 diabetes mellitus without complication, without long-term current use of insulin (HCC) 06/06/2022   Pacemaker 02/22/2022   Hypertensive urgency 06/08/2021   Obstructive sleep apnea 09/21/2020   Obesity (BMI 30.0-34.9) 02/10/2020   COPD (chronic obstructive pulmonary disease) (HCC) 10/26/2019   Thrombocytopenia (HCC) 09/14/2017   Medication noncompliance due to cognitive impairment 09/06/2017   Persistent atrial fibrillation (HCC) 08/04/2017   Cardiomyopathy, ischemic 02/08/2017   CKD (chronic kidney disease), stage III (HCC) 01/30/2017   Coronary artery disease due to lipid rich plaque    Essential hypertension     Current Outpatient Medications:    acetaminophen (TYLENOL) 325 MG tablet, Take 2 tablets (650 mg total) by mouth every 4 (four) hours as needed for headache or mild pain., Disp: , Rfl:    allopurinol (ZYLOPRIM) 100 MG tablet, Take 100 mg by mouth daily., Disp: , Rfl:    atorvastatin (LIPITOR) 10 MG tablet, Take 1 tablet (10 mg total) by mouth at bedtime., Disp: 90 tablet, Rfl: 1   gabapentin (NEURONTIN) 100 MG capsule, Take 1 capsule (100 mg total) by mouth at bedtime., Disp: 90 capsule,  Rfl: 0   hydrALAZINE (APRESOLINE) 25 MG tablet, Take 2 tablets (50 mg total) by mouth 3 (three) times daily., Disp: 180 tablet, Rfl: 1   metFORMIN (GLUCOPHAGE) 500 MG tablet, TAKE 1 TABLET (500 MG TOTAL) BY MOUTH DAILY WITH BREAKFAST (MEAL) (Patient taking differently: Take 500 mg by mouth daily with breakfast.), Disp: 90 tablet, Rfl: 0   Naphazoline HCl (CLEAR EYES OP), Place 1 drop into both eyes daily as needed (irritation)., Disp: , Rfl:    pantoprazole (PROTONIX) 40 MG tablet, Take 1 tablet (40 mg  total) by mouth daily. Follow-up appt due in Marcht must see provider for future refills, Disp: 90 tablet, Rfl: 0   Rivaroxaban (XARELTO) 15 MG TABS tablet, Take 1 tablet (15 mg total) by mouth daily with supper., Disp: 30 tablet, Rfl: 8   spironolactone (ALDACTONE) 25 MG tablet, Take 1 tablet (25 mg total) by mouth daily., Disp: 30 tablet, Rfl: 11   torsemide (DEMADEX) 20 MG tablet, Take 1 tablet (20 mg total) by mouth daily., Disp: 90 tablet, Rfl: 3   TRELEGY ELLIPTA 100-62.5-25 MCG/ACT AEPB, Inhale ONE PUFF into THE lungs daily, Disp: 120 each, Rfl: 1   losartan (COZAAR) 50 MG tablet, Take 0.5 tablets (25 mg total) by mouth daily., Disp: 30 tablet, Rfl: 6   sucralfate (CARAFATE) 1 GM/10ML suspension, Take 10 mLs (1 g total) by mouth 4 (four) times daily -  with meals and at bedtime. (Patient not taking: Reported on 12/27/2022), Disp: 420 mL, Rfl: 0 Allergies  Allergen Reactions   Bee Venom Anaphylaxis   Ivp Dye [Iodinated Contrast Media] Anaphylaxis   Ace Inhibitors     angioedema   Atenolol     severe headaches   Codeine Nausea And Vomiting   Entresto [Sacubitril-Valsartan] Other (See Comments)    Chest pain    Isosorbide     Severe headaches   Shrimp [Shellfish Allergy] Swelling   Simvastatin     not sure   Jardiance [Empagliflozin] Other (See Comments)    Caused Boils    Penicillin G Rash     Social History   Socioeconomic History   Marital status: Widowed    Spouse name: Not on file   Number of children: 1   Years of education: 28   Highest education level: Not on file  Occupational History   Occupation: RETIRED [employer]  Tobacco Use   Smoking status: Former    Packs/day: 0.50    Years: 56.00    Additional pack years: 0.00    Total pack years: 28.00    Types: Cigarettes    Quit date: 11/13/2021    Years since quitting: 1.1   Smokeless tobacco: Never   Tobacco comments:    smoking 2 cigarettes/day as of 10/23/20  Vaping Use   Vaping Use: Never used   Substance and Sexual Activity   Alcohol use: No    Alcohol/week: 0.0 standard drinks of alcohol   Drug use: No   Sexual activity: Not on file  Other Topics Concern   Not on file  Social History Narrative   Patient reports it being difficult to pay for everything due to outstanding medical bills from hospital stay last year but states she is able to keep up with basic expenses.  Patient does have some concerns with her house's condition due to a water leak- had her roof repaired last month but has some leaking in the front which has affected her porch.  Patient owns her own car  and is able to drive herself but has some concerns about the reliability of her car- has gotten taxi to the clinic for appointment in the past when her car broke down.   Social Determinants of Health   Financial Resource Strain: Low Risk  (06/07/2018)   Overall Financial Resource Strain (CARDIA)    Difficulty of Paying Living Expenses: Not hard at all  Recent Concern: Financial Resource Strain - Medium Risk (05/21/2018)   Overall Financial Resource Strain (CARDIA)    Difficulty of Paying Living Expenses: Somewhat hard  Food Insecurity: No Food Insecurity (12/15/2022)   Hunger Vital Sign    Worried About Running Out of Food in the Last Year: Never true    Ran Out of Food in the Last Year: Never true  Transportation Needs: No Transportation Needs (12/15/2022)   PRAPARE - Administrator, Civil Service (Medical): No    Lack of Transportation (Non-Medical): No  Physical Activity: Not on file  Stress: Not on file  Social Connections: Not on file  Intimate Partner Violence: Not At Risk (06/09/2022)   Humiliation, Afraid, Rape, and Kick questionnaire    Fear of Current or Ex-Partner: No    Emotionally Abused: No    Physically Abused: No    Sexually Abused: No    Physical Exam      Future Appointments  Date Time Provider Department Center  01/06/2023  2:45 PM [facility] LAB [facility] None  01/12/2023 11:00  AM [name], MD [facility]  01/17/2023  7:00 AM [facility] DEVICE REMOTES [facility]  01/20/2023 11:30 AM [facility] NURSE [facility] None  02/06/2023  1:15 PM [facility] LAB [facility]  02/07/2023 11:00 AM Iran Ouch, MD [facility] None  03/07/2023  2:00 PM [name], MD [facility]  04/06/2023  1:00 PM [facility] OP 1 [facility]  04/06/2023  2:00 PM Bensimhon, [name], MD [facility] None  04/18/2023  7:05 AM [facility] DEVICE REMOTES [facility]  07/18/2023  7:00 AM [facility] DEVICE REMOTES [facility]  10/17/2023  7:00 AM [facility] DEVICE REMOTES [facility]

## 2023-01-05 NOTE — Telephone Encounter (Signed)
Called to request refill for her Gabapentin.  Will be ready for pick up tomorrow.    [name], EMT-Paramedic [phone] 01/05/2023

## 2023-01-06 ENCOUNTER — Ambulatory Visit (HOSPITAL_COMMUNITY)
Admission: RE | Admit: 2023-01-06 | Discharge: 2023-01-06 | Disposition: A | Discharge status: Home / Self Care | Payer: Medicare Other | Source: Ambulatory Visit | Attending: Cardiology | Admitting: Cardiology

## 2023-01-06 DIAGNOSIS — N183 Chronic kidney disease, stage 3 unspecified: Secondary | ICD-10-CM | POA: Diagnosis not present

## 2023-01-06 DIAGNOSIS — Z0189 Encounter for other specified special examinations: Secondary | ICD-10-CM | POA: Diagnosis not present

## 2023-01-06 LAB — BASIC METABOLIC PANEL
Anion gap: 14 (ref 5–15)
BUN: 31 mg/dL — ABNORMAL HIGH (ref 8–23)
CO2: 19 mmol/L — ABNORMAL LOW (ref 22–32)
Calcium: 9.5 mg/dL (ref 8.9–10.3)
Chloride: 107 mmol/L (ref 98–111)
Creatinine, Ser: 1.22 mg/dL — ABNORMAL HIGH (ref 0.44–1.00)
GFR, Estimated: 45 mL/min — ABNORMAL LOW (ref >60)
Glucose, Bld: 72 mg/dL (ref 70–99)
Potassium: 3.8 mmol/L (ref 3.5–5.1)
Sodium: 140 mmol/L (ref 135–145)

## 2023-01-11 ENCOUNTER — Other Ambulatory Visit: Payer: Self-pay | Admitting: Cardiology

## 2023-01-11 ENCOUNTER — Other Ambulatory Visit: Payer: Self-pay

## 2023-01-11 ENCOUNTER — Emergency Department (HOSPITAL_COMMUNITY): Payer: Medicare Other

## 2023-01-11 ENCOUNTER — Other Ambulatory Visit (HOSPITAL_COMMUNITY): Payer: Self-pay | Admitting: Emergency Medicine

## 2023-01-11 ENCOUNTER — Emergency Department (HOSPITAL_COMMUNITY)
Admission: EM | Admit: 2023-01-11 | Discharge: 2023-01-11 | Disposition: A | Discharge status: Home / Self Care | Payer: Medicare Other | Attending: Emergency Medicine | Admitting: Emergency Medicine

## 2023-01-11 ENCOUNTER — Encounter (HOSPITAL_COMMUNITY): Payer: Self-pay | Admitting: Emergency Medicine

## 2023-01-11 DIAGNOSIS — R1013 Epigastric pain: Secondary | ICD-10-CM | POA: Insufficient documentation

## 2023-01-11 DIAGNOSIS — I4821 Permanent atrial fibrillation: Secondary | ICD-10-CM | POA: Diagnosis not present

## 2023-01-11 DIAGNOSIS — Z7901 Long term (current) use of anticoagulants: Secondary | ICD-10-CM | POA: Insufficient documentation

## 2023-01-11 DIAGNOSIS — J449 Chronic obstructive pulmonary disease, unspecified: Secondary | ICD-10-CM | POA: Diagnosis not present

## 2023-01-11 DIAGNOSIS — I130 Hypertensive heart and chronic kidney disease with heart failure and stage 1 through stage 4 chronic kidney disease, or unspecified chronic kidney disease: Secondary | ICD-10-CM | POA: Diagnosis not present

## 2023-01-11 DIAGNOSIS — K5730 Diverticulosis of large intestine without perforation or abscess without bleeding: Secondary | ICD-10-CM | POA: Diagnosis not present

## 2023-01-11 DIAGNOSIS — Z79899 Other long term (current) drug therapy: Secondary | ICD-10-CM | POA: Insufficient documentation

## 2023-01-11 DIAGNOSIS — I2089 Other forms of angina pectoris: Secondary | ICD-10-CM

## 2023-01-11 DIAGNOSIS — Z951 Presence of aortocoronary bypass graft: Secondary | ICD-10-CM | POA: Diagnosis not present

## 2023-01-11 DIAGNOSIS — I509 Heart failure, unspecified: Secondary | ICD-10-CM | POA: Diagnosis not present

## 2023-01-11 DIAGNOSIS — N189 Chronic kidney disease, unspecified: Secondary | ICD-10-CM | POA: Insufficient documentation

## 2023-01-11 DIAGNOSIS — R079 Chest pain, unspecified: Secondary | ICD-10-CM | POA: Diagnosis not present

## 2023-01-11 DIAGNOSIS — R7989 Other specified abnormal findings of blood chemistry: Secondary | ICD-10-CM | POA: Insufficient documentation

## 2023-01-11 DIAGNOSIS — I25708 Atherosclerosis of coronary artery bypass graft(s), unspecified, with other forms of angina pectoris: Secondary | ICD-10-CM | POA: Diagnosis not present

## 2023-01-11 DIAGNOSIS — I5022 Chronic systolic (congestive) heart failure: Secondary | ICD-10-CM

## 2023-01-11 DIAGNOSIS — Z950 Presence of cardiac pacemaker: Secondary | ICD-10-CM | POA: Diagnosis not present

## 2023-01-11 DIAGNOSIS — R0789 Other chest pain: Secondary | ICD-10-CM | POA: Diagnosis not present

## 2023-01-11 LAB — HEPATIC FUNCTION PANEL
ALT: 12 U/L (ref 0–44)
AST: 17 U/L (ref 15–41)
Albumin: 3 g/dL — ABNORMAL LOW (ref 3.5–5.0)
Alkaline Phosphatase: 111 U/L (ref 38–126)
Bilirubin, Direct: 0.5 mg/dL — ABNORMAL HIGH (ref 0.0–0.2)
Indirect Bilirubin: 1.3 mg/dL — ABNORMAL HIGH (ref 0.3–0.9)
Total Bilirubin: 1.8 mg/dL — ABNORMAL HIGH (ref 0.3–1.2)
Total Protein: 6.8 g/dL (ref 6.5–8.1)

## 2023-01-11 LAB — CBC
HCT: 50 % — ABNORMAL HIGH (ref 36.0–46.0)
Hemoglobin: 15.9 g/dL — ABNORMAL HIGH (ref 12.0–15.0)
MCH: 29.7 pg (ref 26.0–34.0)
MCHC: 31.8 g/dL (ref 30.0–36.0)
MCV: 93.5 fL (ref 80.0–100.0)
Platelets: 133 10*3/uL — ABNORMAL LOW (ref 150–400)
RBC: 5.35 MIL/uL — ABNORMAL HIGH (ref 3.87–5.11)
RDW: 17 % — ABNORMAL HIGH (ref 11.5–15.5)
WBC: 7.5 10*3/uL (ref 4.0–10.5)
nRBC: 0 % (ref 0.0–0.2)

## 2023-01-11 LAB — I-STAT CHEM 8, ED
BUN: 41 mg/dL — ABNORMAL HIGH (ref 8–23)
Calcium, Ion: 0.92 mmol/L — ABNORMAL LOW (ref 1.15–1.40)
Chloride: 114 mmol/L — ABNORMAL HIGH (ref 98–111)
Creatinine, Ser: 1.2 mg/dL — ABNORMAL HIGH (ref 0.44–1.00)
Glucose, Bld: 103 mg/dL — ABNORMAL HIGH (ref 70–99)
HCT: 46 % (ref 36.0–46.0)
Hemoglobin: 15.6 g/dL — ABNORMAL HIGH (ref 12.0–15.0)
Potassium: 6.6 mmol/L (ref 3.5–5.1)
Sodium: 141 mmol/L (ref 135–145)
TCO2: 21 mmol/L — ABNORMAL LOW (ref 22–32)

## 2023-01-11 LAB — BASIC METABOLIC PANEL
Anion gap: 11 (ref 5–15)
BUN: 23 mg/dL (ref 8–23)
CO2: 18 mmol/L — ABNORMAL LOW (ref 22–32)
Calcium: 9.2 mg/dL (ref 8.9–10.3)
Chloride: 112 mmol/L — ABNORMAL HIGH (ref 98–111)
Creatinine, Ser: 1.27 mg/dL — ABNORMAL HIGH (ref 0.44–1.00)
GFR, Estimated: 43 mL/min — ABNORMAL LOW (ref >60)
Glucose, Bld: 107 mg/dL — ABNORMAL HIGH (ref 70–99)
Potassium: 4.4 mmol/L (ref 3.5–5.1)
Sodium: 141 mmol/L (ref 135–145)

## 2023-01-11 LAB — LIPASE, BLOOD: Lipase: 23 U/L (ref 11–51)

## 2023-01-11 LAB — TROPONIN I (HIGH SENSITIVITY)
Troponin I (High Sensitivity): 20 ng/L — ABNORMAL HIGH
Troponin I (High Sensitivity): 23 ng/L — ABNORMAL HIGH

## 2023-01-11 MED ORDER — NITROGLYCERIN 0.4 MG SL SUBL
0.4000 mg | SUBLINGUAL_TABLET | SUBLINGUAL | Status: DC | PRN
  Administered 2023-01-11: 0.4 mg via SUBLINGUAL
  Filled 2023-01-11: qty 1

## 2023-01-11 MED ORDER — METHYLPREDNISOLONE SODIUM SUCC 125 MG IJ SOLR
125.0000 mg | Freq: Once | INTRAMUSCULAR | Status: AC
  Administered 2023-01-11: 125 mg via INTRAVENOUS
  Filled 2023-01-11: qty 2

## 2023-01-11 NOTE — ED Notes (Signed)
Pt transported to xray, xray staff will place patient in 6 when done

## 2023-01-11 NOTE — Discharge Instructions (Signed)
Office will schedule you for cardiac PET scan to look for lack of blood supply to heart.  When they call ask them for instructions.

## 2023-01-11 NOTE — Progress Notes (Signed)
Today was not a scheduled visit.  12:15 today.   I saw pt was in the ED and I dropped by from [facility] to check on her.  She stated she came by car to the ER for discomfort that originated in her abdomen and radiated up into her chest and changed with movement.  Advised I would watch to see if she is admitted or discharged and keep an eye on any med changes she may need- if any.    [name], EMT-Paramedic [phone] 01/11/2023

## 2023-01-11 NOTE — ED Triage Notes (Signed)
PT reports chest pain that started yesterday. Pt reporting the pain started in her abdomen and radiates to her chest. Pt reports she tried to take zantac and "something for constipation" to try and relieve the pain.

## 2023-01-11 NOTE — Consult Note (Addendum)
Cardiology Consultation   Patient ID: [name] [name] MRN: [id number]; DOB: 09-Aug-1946  Admit date: 01/11/2023 Date of Consult: 01/11/2023  PCP:  [name], MD   [facility] Providers Cardiologist:  Donato Schultz, MD  Electrophysiologist:  Lewayne Bunting, MD  Advanced Heart Failure:  Arvilla Meres, MD       Patient Profile:   [name] [name] is a 77 y.o. female with a hx of HTN, former tobacco abuse, permanent AF, CAD s.p CABG X 2  (LIMA to LAD and SVG to OM) in 2016, Chronic combined HF, Ischemic CM, HLD, and Biotronik PPM who is being seen 01/11/2023 for the evaluation of chest pain at the request of Dr. Rosalia Hammers.  History of Present Illness:   [name] [name] with above hx and initially noted to be in AFib in 2016 and reduced EF, with ischemic work up with severe CAD and underwent CABG as above.  She was not a candidate for ablation or tikosyn due to non compliance.  In 2019 placed on amiodarone and entresto and had successful TEE/DCCV though went back into a fib.  In 2022 she had AV node ablation and PPM due to uncontrolled HR with her a fib. Underwent Biotronix single lead PPM.  She has had admits for A/C systolic HF and for COPD exacerbation.  Aslo with admit for IDA with Hgb 5.4 GI eval  with duodenal ulcer with bleeding on capsule endoscopy.    In 05/2022 admitted with a/c CHF worrisome for low output.  EF 20% severe LV dysfunction RV mod down diuresed with IV lasix and IV milrinone  RHC showed low volume and CI 1.9 on milrinone, improved with small IVF bolus. Drips weaned and GDMT added back. BiDil increased with elevated BP. Referred back to [facility] to help with meds. Discharged home, weight 183 lbs (in past on entresto had chest pain so stopped)  This year EP attempted to upgrade her PPM to ICD but she was found to have occlusion of left subclavian vein and procedure was aborted. Saw Dr. Ladona Ridgel 04/25 and offered lead extraction and insertion of BiV ICD with removal of pacing  lead and insertion of CS lead, left bundle area lead and ICD lead. She was not interested.   Last admit 4/24 with a/c HF due to medical non adherence -she was diuresed - Echo with EF 25%  RV mod reduced, RVSP 55 mmHg, severe BAE, mod MR GDMT titrated and wt at discharge 171 lbs. Last seen in office 12/23/22--wt stable.  Continues to smoke but not daily.   Now presents to ER this AM after developing pain in chest and abd, began in her abd and with radiation to chest.  She took zantac and laxative with no improvement of pain.  Pain moved to lt ant chest. A pressure like pain.  8-10/10 pain non radiating.  ER notes some tenderness in epigastric region.  NTG was given and pain resolved.  She feels like after eating last pm the food did not move much.  Currently no pain.   EKG:  The EKG was personally reviewed and demonstrates:  atrial fib with V pacing and some sensed beats. Telemetry:  Telemetry was personally reviewed and demonstrates:  atrial fib with V pacing Na 141 K+ 4.4 - ? Hemolyzed earlier at 6.6  BUN 23 CR 1.27 albumin 3.0 AST/ALT WNL Indirect bili 1.3 T Bili 1.8  Hs troponin 20 and 23  ( on last admit a month ago hs troponin was in  the 40s and 4 months ago hs troponin was in 40s as well) WBC 7.5 Hgb 15.9 plts 133  Lipase 23  2V CXR NAD  CT ABD/pelvis IMPRESSION: 1. No acute or focal lesion to explain the patient's symptoms. 2. Descending and sigmoid diverticulosis without diverticulitis. 3. Stable right adnexal soft tissue mass. This is likely benign. Recommend follow-up ultrasound in 1 year. 4. Calcified uterine fibroids. 5. Superior endplate Schmorl's nodes at T12 and L5. Both demonstrate surrounding bone sclerosis. Recommend follow-up ultrasound in 1 year. 6.  Aortic Atherosclerosis (ICD10-I70.0).  BP 179/97 P 60-70 R 23-24 afebrile  Past Medical History:  Diagnosis Date   (HFimpEF) heart failure with improved ejection fraction (HCC)    a. 07/2018 Echo: EF 30-35%; b. 11/2019  Echo: EF 55-60%, no rwma, Gr2 DD, Nl RV size/fxn. Mild BAE. Mild MR/AI.   Arthritis    Asthma    Atopic dermatitis    CAD (coronary artery disease)    a. 2016 s/p CABG x 2 (LIMA->LAD, VG->OM); b. 08/2018 MV: EF 44%, no ischemia/infact.   Cardiac arrest (HCC) 10/2014   Cardiomyopathy, ischemic    a. 07/2018 Echo: EF 30-35%; 11/2019 Echo: EF 55-60%.   Carotid arterial disease (HCC)    a. 11/2019 Carotid U/S   CHF (congestive heart failure) (HCC)    CKD (chronic kidney disease), stage III (HCC)    COPD (chronic obstructive pulmonary disease) (HCC)    Diabetes mellitus without complication (HCC)    Dysrhythmia    Esophageal dilatation 2013   GERD (gastroesophageal reflux disease)    Glaucoma    Gout    Headache    Hypertension    Hypokalemia    Idiopathic angioedema    LGI bleed 08/06/2017   a. felt to be hemorrhoidal during that admission (no drop in Hgb).   Lower back pain    Paroxysmal atrial fibrillation (HCC)    a. Dx 2016-->h/o difficult to control rates (complicated by noncompliance), not felt to be a candidate for ablation or antiarrhythmic due to noncompliance; b. Recurrent AF 2021 - converted w/ IV dilt; c. CHA2DS2VASc = 7-->Xarelto.   Personal history of noncompliance with medical treatment, presenting hazards to health    Prediabetes    Presence of permanent cardiac pacemaker    S/P CABG x 2 with clipping of LA appendage 11/14/2014   LIMA to LAD, SVG to OM, EVH via right thigh   Urine incontinence    Uterine fibroid     Past Surgical History:  Procedure Laterality Date   AV NODE ABLATION N/A 01/18/2021   Procedure: AV NODE ABLATION;  Surgeon: [name], MD;  Location: [facility] INVASIVE CV LAB;  Service: Cardiovascular;  Laterality: N/A;   BALLOON DILATION N/A 10/10/2019   Procedure: BALLOON DILATION;  Surgeon: Kathi Der, MD;  Location: [facility] ENDOSCOPY;  Service: Gastroenterology;  Laterality: N/A;   BIOPSY  10/10/2019   Procedure: BIOPSY;  Surgeon: Kathi Der, MD;  Location: [facility] ENDOSCOPY;  Service: Gastroenterology;;   BIOPSY  10/11/2019   Procedure: BIOPSY;  Surgeon: Kathi Der, MD;  Location: [facility] ENDOSCOPY;  Service: Gastroenterology;;   Earnstine Regal N/A 11/21/2022   Procedure: BIVI  ICD UPGRADE;  Surgeon: [name], MD;  Location: [facility] INVASIVE CV LAB;  Service: Cardiovascular;  Laterality: N/A;   CARDIOVERSION N/A 11/18/2014   Procedure: CARDIOVERSION;  Surgeon: Chrystie Nose, MD;  Location: [facility] OR;  Service: Cardiovascular;  Laterality: N/A;   CARDIOVERSION N/A 12/25/2017   Procedure: CARDIOVERSION;  Surgeon: Gala Romney,  [name], MD;  Location: [facility] ENDOSCOPY;  Service: Cardiovascular;  Laterality: N/A;   CLIPPING OF ATRIAL APPENDAGE N/A 11/14/2014   Procedure: CLIPPING OF ATRIAL APPENDAGE;  Surgeon: Purcell Nails, MD;  Location: [facility] OR;  Service: Open Heart Surgery;  Laterality: N/A;   COLONOSCOPY  2013   COLONOSCOPY WITH PROPOFOL N/A 10/11/2019   Procedure: COLONOSCOPY WITH PROPOFOL;  Surgeon: Kathi Der, MD;  Location: [facility] ENDOSCOPY;  Service: Gastroenterology;  Laterality: N/A;   CORONARY ARTERY BYPASS GRAFT N/A 11/14/2014   Procedure: CORONARY ARTERY BYPASS GRAFTING (CABG)TIMES 2 USING LEFT INTERNAL MAMMARY ARTERY AND RIGHT SAPHENOUS VEIN HARVESTED ENDOSCOPICALLY;  Surgeon: Purcell Nails, MD;  Location: [facility] OR;  Service: Open Heart Surgery;  Laterality: N/A;   ESOPHAGOGASTRODUODENOSCOPY (EGD) WITH PROPOFOL N/A 10/10/2019   Procedure: ESOPHAGOGASTRODUODENOSCOPY (EGD) WITH PROPOFOL;  Surgeon: Kathi Der, MD;  Location: [facility] ENDOSCOPY;  Service: Gastroenterology;  Laterality: N/A;   ESOPHAGOGASTRODUODENOSCOPY (EGD) WITH PROPOFOL N/A 10/27/2019   Procedure: ESOPHAGOGASTRODUODENOSCOPY (EGD) WITH PROPOFOL;  Surgeon: Kerin Salen, MD;  Location: [facility] ENDOSCOPY;  Service: Gastroenterology;  Laterality: N/A;   GIVENS CAPSULE STUDY N/A 10/27/2019   Procedure: GIVENS CAPSULE STUDY;  Surgeon: Kerin Salen, MD;  Location: [facility]  ENDOSCOPY;  Service: Gastroenterology;  Laterality: N/A;   INSERT / REPLACE / REMOVE PACEMAKER     LEFT HEART CATHETERIZATION WITH CORONARY ANGIOGRAM N/A 11/04/2014   Procedure: LEFT HEART CATHETERIZATION WITH CORONARY ANGIOGRAM;  Surgeon: Lennette Bihari, MD;  Location: [facility] CATH LAB;  Service: Cardiovascular;  Laterality: N/A;   PACEMAKER IMPLANT N/A 01/18/2021   Procedure: PACEMAKER IMPLANT;  Surgeon: [name], MD;  [facility]: [facility] INVASIVE CV LAB;  Service: Cardiovascular;  Laterality: N/A;   PACEMAKER IMPLANT Left    POLYPECTOMY  10/11/2019   Procedure: POLYPECTOMY;  Surgeon: Kathi Der, MD;  Location: [facility] ENDOSCOPY;  Service: Gastroenterology;;   RIGHT HEART CATH N/A 06/08/2022   Procedure: RIGHT HEART CATH;  Surgeon: Dolores Patty, MD;  Location: [facility] INVASIVE CV LAB;  Service: Cardiovascular;  Laterality: N/A;   TEE WITHOUT CARDIOVERSION N/A 11/14/2014   Procedure: TRANSESOPHAGEAL ECHOCARDIOGRAM (TEE);  Surgeon: Purcell Nails, MD;  Location: [facility] OR;  Service: Open Heart Surgery;  Laterality: N/A;   TEE WITHOUT CARDIOVERSION N/A 12/25/2017   Procedure: TRANSESOPHAGEAL ECHOCARDIOGRAM (TEE);  Surgeon: Dolores Patty, MD;  Location: [facility] ENDOSCOPY;  Service: Cardiovascular;  Laterality: N/A;   TEMPORARY PACEMAKER INSERTION  11/04/2014   Procedure: TEMPORARY PACEMAKER INSERTION;  Surgeon: Lennette Bihari, MD;  Location: [facility] CATH LAB;  Service: Cardiovascular;;   TOOTH EXTRACTION       Home Medications:  Prior to Admission medications   Medication Sig Start Date End Date Taking? Authorizing Provider  acetaminophen (TYLENOL) 325 MG tablet Take 2 tablets (650 mg total) by mouth every 4 (four) hours as needed for headache or mild pain. 10/11/22   [name], MD  allopurinol (ZYLOPRIM) 100 MG tablet Take 100 mg by mouth daily. 07/19/21   [provider]  atorvastatin (LIPITOR) 10 MG tablet Take 1 tablet (10 mg total) by mouth at bedtime. 10/11/22   [name], MD   gabapentin (NEURONTIN) 100 MG capsule Take 1 capsule (100 mg total) by mouth at bedtime. 11/23/22   Vivi Barrack, DPM  hydrALAZINE (APRESOLINE) 25 MG tablet Take 2 tablets (50 mg total) by mouth 3 (three) times daily. 12/16/22   Bensimhon, [name], MD  losartan (COZAAR) 50 MG tablet Take 0.5 tablets (25 mg total) by mouth daily. 01/05/23  [place], Shanda Bumps M, FNP  metFORMIN (GLUCOPHAGE) 500 MG tablet TAKE 1 TABLET (500 MG TOTAL) BY MOUTH DAILY WITH BREAKFAST (MEAL) Patient taking differently: Take 500 mg by mouth daily with breakfast. 11/25/22   [name], MD  Naphazoline HCl (CLEAR EYES OP) Place 1 drop into both eyes daily as needed (irritation).    [provider]  pantoprazole (PROTONIX) 40 MG tablet Take 1 tablet (40 mg total) by mouth daily. Follow-up appt due in Marcht must see provider for future refills 09/27/22   [name], MD  Rivaroxaban (XARELTO) 15 MG TABS tablet Take 1 tablet (15 mg total) by mouth daily with supper. 12/23/22   Jacklynn Ganong, FNP  spironolactone (ALDACTONE) 25 MG tablet Take 1 tablet (25 mg total) by mouth daily. 12/16/22   Bensimhon, [name], MD  sucralfate (CARAFATE) 1 GM/10ML suspension Take 10 mLs (1 g total) by mouth 4 (four) times daily -  with meals and at bedtime. Patient not taking: Reported on 12/27/2022 09/06/22   Dione Booze, MD  torsemide (DEMADEX) 20 MG tablet Take 1 tablet (20 mg total) by mouth daily. 12/28/22   Jacklynn Ganong, FNP  TRELEGY ELLIPTA 100-62.5-25 MCG/ACT AEPB Inhale ONE PUFF into THE lungs daily 06/27/22   [name], MD  losartan (COZAAR) 50 MG tablet Take 1 tablet (50 mg total) by mouth daily. 12/16/22   Bensimhon, [name], MD    Inpatient Medications: Scheduled Meds:  Continuous Infusions:  PRN Meds: nitroGLYCERIN  Allergies:    Allergies  Allergen Reactions   Bee Venom Anaphylaxis   Ivp Dye [Iodinated Contrast Media] Anaphylaxis   Ace Inhibitors     angioedema   Atenolol     severe  headaches   Codeine Nausea And Vomiting   Entresto [Sacubitril-Valsartan] Other (See Comments)    Chest pain    Isosorbide     Severe headaches   Shrimp [Shellfish Allergy] Swelling   Simvastatin     not sure   Jardiance [Empagliflozin] Other (See Comments)    Caused Boils    Penicillin G Rash    Social History:   Social History   Socioeconomic History   Marital status: Widowed    Spouse name: Not on file   Number of children: 1   Years of education: 77   Highest education level: Not on file  Occupational History   Occupation: RETIRED [employer]  Tobacco Use   Smoking status: Former    Packs/day: 0.50    Years: 56.00    Additional pack years: 0.00    Total pack years: 28.00    Types: Cigarettes    Quit date: 11/13/2021    Years since quitting: 1.1   Smokeless tobacco: Never   Tobacco comments:    smoking 2 cigarettes/day as of 10/23/20  Vaping Use   Vaping Use: Never used  Substance and Sexual Activity   Alcohol use: No    Alcohol/week: 0.0 standard drinks of alcohol   Drug use: No   Sexual activity: Not on file  Other Topics Concern   Not on file  Social History Narrative   Patient reports it being difficult to pay for everything due to outstanding medical bills from hospital stay last year but states she is able to keep up with basic expenses.  Patient does have some concerns with her house's condition due to a water leak- had her roof repaired last month but has some leaking in the front which has affected her  porch.  Patient owns her own car and is able to drive herself but has some concerns about the reliability of her car- has gotten taxi to the clinic for appointment in the past when her car broke down.   Social Determinants of Health   Financial Resource Strain: Low Risk  (06/07/2018)   Overall Financial Resource Strain (CARDIA)    Difficulty of Paying Living Expenses: Not hard at all  Recent Concern: Financial Resource Strain - Medium Risk  (05/21/2018)   Overall Financial Resource Strain (CARDIA)    Difficulty of Paying Living Expenses: Somewhat hard  Food Insecurity: No Food Insecurity (12/15/2022)   Hunger Vital Sign    Worried About Running Out of Food in the Last Year: Never true    Ran Out of Food in the Last Year: Never true  Transportation Needs: No Transportation Needs (12/15/2022)   PRAPARE - Administrator, Civil Service (Medical): No    Lack of Transportation (Non-Medical): No  Physical Activity: Not on file  Stress: Not on file  Social Connections: Not on file  Intimate Partner Violence: Not At Risk (06/09/2022)   Humiliation, Afraid, Rape, and Kick questionnaire    Fear of Current or Ex-Partner: No    Emotionally Abused: No    Physically Abused: No    Sexually Abused: No    Family History:    Family History  Problem Relation Age of Onset   Cancer Mother        LYMPHOMA   Heart disease Father    Other Father        TB   CVA Sister    Prostate cancer Brother 95   Diabetes Brother      ROS:  Please see the history of present illness.  General:no colds or fevers, no weight changes  st 173 lbs yesterday Skin:no rashes or ulcers HEENT:no blurred vision, no congestion CV:see HPI PUL:see HPI GI:no diarrhea constipation or melena, no indigestion GU:no hematuria, no dysuria MS:no joint pain, no claudication hx arthritis Neuro:no syncope, no lightheadedness Endo:+ diabetes, no thyroid disease  All other ROS reviewed and negative.     Physical Exam/Data:   Vitals:   01/11/23 1015 01/11/23 1235 01/11/23 1239 01/11/23 1330  BP: (!) 154/100 (!) 155/77  (!) 179/97  Pulse: 68 60  (!) 50  Resp: (!) 23 (!) 22  19  Temp:   98 F (36.7 C)   TempSrc:   Oral   SpO2: 99% 100%  99%   No intake or output data in the 24 hours ending 01/11/23 1447    01/05/2023    1:30 PM 12/29/2022    3:28 PM 12/27/2022    4:17 PM  Last 3 Weights  Weight (lbs) 172 lb 175 lb 178 lb  Weight (kg) 78.019 kg 79.379  kg 80.74 kg     There is no height or weight on file to calculate BMI.  General:  Well nourished, well developed, in no acute distress HEENT: normal Neck: mild JVD Vascular: No carotid bruits; Distal pulses 2+ bilaterally Cardiac:  normal S1, S2; RRR; no murmur gallup rub or click  Lungs:  clear to diminished to auscultation bilaterally, no wheezing, rhonchi or rales  Abd: soft, mild tenderness upper abd on left >rt , no hepatomegaly  Ext: no edema Musculoskeletal:  No deformities, BUE and BLE strength normal and equal Skin: warm and dry  Neuro:  alert and oriented X 3 MAE follows commands no focal abnormalities noted Psych:  Normal affect   Relevant CV Studies: Cardiac Studies   - Echo (4/24): EF 25%, RV moderately down, moderate MR   - RHC (10/23): on milrinone 0.25 RA = 1 RV = 27/1 PA = 32/13 (18) PCW = 2 Fick cardiac output/index = 5.3/2.7 Thermo = 3.8/1.9 PVR = 3.0 (fick) 4.2 (TD) Ao sat = 93% PA sat = 71%, 68%   Given 175 cc NS load   PCW = 5 TD CO/CI = 4.1/2.1   1. Low volume status 2. With mild to moderately depressed CO on milrinone 3. Mild improvement in hemodynamics with volume loading     - Echo (10/23): EF 20%, severe LV dysfunction, RV moderately down   - Echo (6/22): EF 25-30% RV severely reduced   - Echo (4/22): EF 25-30% RV moderately reduced    - Echo (11/21): EF 30-35% RV normal    - Echo (4/21): EF 55-60%    - Stress Test (1/20): Low Risk EF 44%  no ischemia   - Echo (12/19): EF 30-35% RV mildly dilated.   - Echo (3/19): EF 30-35%, moderate LVH, Mild AI, Moderate regurgitation, RV moderately down, PA peak pressure 50 mm Hg.    - Echo (5/18): EF stable 50-55%   - Echo (10/17): EF improved to 50-55%.   - Echo (3/16): EF 35-40%.  Laboratory Data:  High Sensitivity Troponin:   Recent Labs  Lab 12/12/22 2001 12/12/22 2138 01/11/23 0854 01/11/23 1044  TROPONINIHS 42* 43* 20* 23*     Chemistry Recent Labs  Lab 01/06/23 1438  01/11/23 0854 01/11/23 1032  NA 140 141 141  K 3.8 4.4 6.6*  CL 107 112* 114*  CO2 19* 18*  --   GLUCOSE 72 107* 103*  BUN 31* 23 41*  CREATININE 1.22* 1.27* 1.20*  CALCIUM 9.5 9.2  --   GFRNONAA 45* 43*  --   ANIONGAP 14 11  --     Recent Labs  Lab 01/11/23 0854  PROT 6.8  ALBUMIN 3.0*  AST 17  ALT 12  ALKPHOS 111  BILITOT 1.8*   Lipids No results for input(s): "CHOL", "TRIG", "HDL", "LABVLDL", "LDLCALC", "CHOLHDL" in the last 168 hours.  Hematology Recent Labs  Lab 01/11/23 0854 01/11/23 1032  WBC 7.5  --   RBC 5.35*  --   HGB 15.9* 15.6*  HCT 50.0* 46.0  MCV 93.5  --   MCH 29.7  --   MCHC 31.8  --   RDW 17.0*  --   PLT 133*  --    Thyroid No results for input(s): "TSH", "FREET4" in the last 168 hours.  BNPNo results for input(s): "BNP", "PROBNP" in the last 168 hours.  DDimer No results for input(s): "DDIMER" in the last 168 hours.   Radiology/Studies:  CT ABDOMEN PELVIS WO CONTRAST  Result Date: 01/11/2023 CLINICAL DATA:  Abdominal pain, acute, nonlocalized. Patient states pain began in the abdomen but now radiates to the chest. EXAM: CT ABDOMEN AND PELVIS WITHOUT CONTRAST TECHNIQUE: Multidetector CT imaging of the abdomen and pelvis was performed following the standard protocol without IV contrast. RADIATION DOSE REDUCTION: This exam was performed according to the departmental dose-optimization program which includes automated exposure control, adjustment of the mA and/or kV according to patient size and/or use of iterative reconstruction technique. COMPARISON:  Acute abdominal series 09/06/2022. CT of the abdomen and pelvis without contrast 12/23/2021. FINDINGS: Lower chest: The lung bases are clear without focal nodule, mass, or airspace disease. Heart is mildly enlarged. Pacing wires  are stable. No significant pleural or pericardial effusion is present. Hepatobiliary: No focal liver abnormality is seen. No gallstones, gallbladder wall thickening, or biliary  dilatation. Pancreas: Unremarkable. No pancreatic ductal dilatation or surrounding inflammatory changes. Spleen: Normal in size without focal abnormality. Adrenals/Urinary Tract: Adrenal glands are normal bilaterally. Kidneys are unremarkable. No stone or mass lesion is present. The ureters are within normal limits bilaterally. The urinary bladder is unremarkable. Stomach/Bowel: The stomach and duodenum are within normal limits. The small bowel is unremarkable. Terminal ileum is within normal limits. The appendix is visualized and normal. The ascending and transverse colon are within normal limits. Diverticular present in the descending colon without focal inflammatory changes. Sigmoid colon is unremarkable. Vascular/Lymphatic: Extensive atherosclerotic calcifications are again noted. Dense calcifications are present in the distal abdominal aorta and proximal iliac vessels with suspected stenosis bilaterally. Calcifications are similar the prior exam. Reproductive: Calcified uterine fibroids are again noted. The right adnexal soft tissue mass is again noted, stable. Left adnexa is unremarkable. Other: No abdominal wall hernia or abnormality. No abdominopelvic ascites. Musculoskeletal: A prominent superior endplate Schmorl's node at L5 demonstrates surrounding sclerotic changes. A superior endplate Schmorl's node at T12 also demonstrates chronic surrounding bone changes. Vertebral body heights are otherwise normal. No other focal osseous lesions are present. The bony pelvis is within normal limits. The hips are located and within normal limits bilaterally. IMPRESSION: 1. No acute or focal lesion to explain the patient's symptoms. 2. Descending and sigmoid diverticulosis without diverticulitis. 3. Stable right adnexal soft tissue mass. This is likely benign. Recommend follow-up ultrasound in 1 year. 4. Calcified uterine fibroids. 5. Superior endplate Schmorl's nodes at T12 and L5. Both demonstrate surrounding bone  sclerosis. Recommend follow-up ultrasound in 1 year. 6.  Aortic Atherosclerosis (ICD10-I70.0). Electronically Signed   By: Marin Roberts M.D.   On: 01/11/2023 11:06   DG Chest 2 View  Result Date: 01/11/2023 CLINICAL DATA:  Chest pain. EXAM: CHEST - 2 VIEW COMPARISON:  12/29/2022. FINDINGS: Left chest pacemaker with single lead projecting over the right ventricle. No consolidation or pulmonary edema. Stable mild cardiomegaly and tortuosity of the thoracic aorta with postoperative changes of median sternotomy, CABG and left atrial appendage clipping. No pleural effusion or pneumothorax. IMPRESSION: No evidence of acute cardiopulmonary disease. Electronically Signed   By: Orvan Falconer M.D.   On: 01/11/2023 09:21     Assessment and Plan:   Chest pain with known CAD and CABG X 2 in 2016 with LIMA to LAD and VG to OM neg nuc in 2020 for ischemia.  No left cardiac cath since CABG  no acute EKG changes, afib rate controlled and V pacing.  Troponins mildly elevated but have been higher with a/c HF.  She also has hx of GI bleed her hgb is stable. No complaints of melena. No asa on [facility] - Dr. Royann Shivers to see  may discharge and outpt pt cPET for ischemia  HTN this admit  elevated not sure she took her home meds yet Chronic HFrEF have suspected ICM/NICM  with previous a fib with RVR.   Permanent afib with AV nodal ablation in 2022 and biotronik dual chamber PPM- have tried to upgrade due to HFrEF but unable to do so- found to have occlusion of left subclavian vein and procedure was aborted. Saw Dr. Ladona Ridgel 04/25 and offered lead extraction and insertion of BiV ICD with removal of pacing lead and insertion of CS lead, left bundle area lead and ICD lead. She was not interested.  Anticoagulation with xarelto. Adjusted for CrCl MR previously severe mod on most recent echo COPD - continues to smoke. Still with cough but now clear sputum--this is from her admit in late April early May CKD 3b with baseline Cr  1.2-1.4     Risk Assessment/Risk Scores:     TIMI Risk Score for Unstable Angina or Non-ST Elevation MI:   [name] TIMI risk score is 3, which indicates a 13% risk of all cause mortality, new or recurrent myocardial infarction or need for urgent revascularization in the next 14 days.    CHA2DS2-VASc Score = 6   This indicates a 9.7% annual risk of stroke. [name] score is based upon: CHF History: 1 HTN History: 1 Diabetes History: 1 Stroke History: 0 Vascular Disease History: 0 Age Score: 2 Gender Score: 1         For questions or updates, please contact [facility] Please consult [email or web address] for contact info under    Signed, Nada Boozer, NP  01/11/2023 2:47 PM   I have seen and examined the patient along with Nada Boozer, NP .  I have reviewed the chart, notes and new data.  I agree with PA/NP's note.  Key new complaints: epigastric atypical and different from her previous presentation with ischemic heart disease.  She is now asymptomatic.  She reports that she has not done as well since the most recent adjustments to her pacemaker settings, although the complaints that have worsened are not cardiovascular in nature. Key examination changes: Healthy pacemaker site.  Irregular rhythm with intermittent ventricular pacing on monitor.  No clinical evidence of hypervolemia (normal JVP, no peripheral edema, clear lungs).  No gallops or significant murmurs are heard. Key new findings / data: ECG shows alternating native junctional rhythm could have paced beats.  The QRS complex is distinctly more narrow with a junctional beats, although it is remarkably narrow with ventricular pacing as well.  There is ST segment depression and T wave inversion most obvious in leads I, aVL, V4-V6 that is unchanged from previous tracings. The high-sensitivity troponin is normal (just outside the upper limit of normal and flat pattern, lower than at previous presentations.).   The BNP is marginally elevated at 136, the lowest value she has had in the last 5 years. Most recent echocardiogram.  Difficult to interpret wall motion due to variability in rhythm/pacing status and dyssynchrony.  I believe there is however clear evidence of hypokinesis of the entire anterolateral wall and inferolateral wall that is independent of the conduction related dyssynchrony.  Therefore, I would be concerned about ischemia in the distribution of the left circumflex coronary artery.  PLAN: Suspicion for acute coronary syndrome is low, but the persistently depressed LV systolic function remains a concern for coronary ischemia.  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 Recommend outpatient evaluation for coronary ischemia with a PET scan. She has an office appointment with Dr. Lewayne Bunting tomorrow where further adjustments can be made to the pacemaker settings.  Thurmon Fair, MD, [facility] [phone] 01/11/2023, 3:50 PM

## 2023-01-11 NOTE — ED Provider Notes (Signed)
[employer] EMERGENCY DEPARTMENT AT [facility] Provider Note   CSN: [id number] Arrival date & time: 01/11/23  0830     History {Add pertinent medical, surgical, social history, OB history to HPI:1} Chief Complaint  Patient presents with   Chest Pain    [name] [name] is a 77 y.o. female.  HPI 77 year old female history of coronary artery bypass grafting 2016, CHF A-fib, status post pacemaker, hypertension, cardiomyopathy, CKD, GERD, COPD, presents today complaining of epigastric pain and chest pain.  Patient states she began having some epigastric pain yesterday.  She thought that this was due to "a blockage.  She states she initially thought it was a urinary tract blockage but then was able to urinate and then felt that she might be having constipation but then was able to have a bowel movement.  She states the pain then moved into the left side of her chest and is pressure-like in nature.  She reports no prior history of chest pain states that she did not really have chest pain when she had her coronary artery bypass grafting except for related to the surgery itself.  Pain has been present since sometime last night although she was able to sleep.  She describes the pain as 8 out of 10 and pressure-like.  Is nonradiating.  She can tell me nothing that worsens it or eases it off.    Home Medications Prior to Admission medications   Medication Sig Start Date End Date Taking? Authorizing Provider  acetaminophen (TYLENOL) 325 MG tablet Take 2 tablets (650 mg total) by mouth every 4 (four) hours as needed for headache or mild pain. 10/11/22   [name], MD  allopurinol (ZYLOPRIM) 100 MG tablet Take 100 mg by mouth daily. 07/19/21   [provider]  atorvastatin (LIPITOR) 10 MG tablet Take 1 tablet (10 mg total) by mouth at bedtime. 10/11/22   [name], MD  gabapentin (NEURONTIN) 100 MG capsule Take 1 capsule (100 mg total) by mouth at bedtime. 11/23/22   Vivi Barrack, DPM  hydrALAZINE (APRESOLINE) 25 MG tablet Take 2 tablets (50 mg total) by mouth 3 (three) times daily. 12/16/22   Bensimhon, [name], MD  losartan (COZAAR) 50 MG tablet Take 0.5 tablets (25 mg total) by mouth daily. 01/05/23   [place], Anderson Malta, FNP  metFORMIN (GLUCOPHAGE) 500 MG tablet TAKE 1 TABLET (500 MG TOTAL) BY MOUTH DAILY WITH BREAKFAST (MEAL) Patient taking differently: Take 500 mg by mouth daily with breakfast. 11/25/22   [name], MD  Naphazoline HCl (CLEAR EYES OP) Place 1 drop into both eyes daily as needed (irritation).    [provider]  pantoprazole (PROTONIX) 40 MG tablet Take 1 tablet (40 mg total) by mouth daily. Follow-up appt due in Marcht must see provider for future refills 09/27/22   [name], MD  Rivaroxaban (XARELTO) 15 MG TABS tablet Take 1 tablet (15 mg total) by mouth daily with supper. 12/23/22   Jacklynn Ganong, FNP  spironolactone (ALDACTONE) 25 MG tablet Take 1 tablet (25 mg total) by mouth daily. 12/16/22   Bensimhon, [name], MD  sucralfate (CARAFATE) 1 GM/10ML suspension Take 10 mLs (1 g total) by mouth 4 (four) times daily -  with meals and at bedtime. Patient not taking: Reported on 12/27/2022 09/06/22   Dione Booze, MD  torsemide (DEMADEX) 20 MG tablet Take 1 tablet (20 mg total) by mouth daily. 12/28/22   Jacklynn Ganong, FNP  TRELEGY ELLIPTA 100-62.5-25 MCG/ACT AEPB Inhale ONE PUFF into THE lungs daily 06/27/22   [name], MD  losartan (COZAAR) 50 MG tablet Take 1 tablet (50 mg total) by mouth daily. 12/16/22   Bensimhon, [name], MD      Allergies    Bee venom, Ivp dye [iodinated contrast media], Ace inhibitors, Atenolol, Codeine, Entresto [sacubitril-valsartan], Isosorbide, Shrimp [shellfish allergy], Simvastatin, Jardiance [empagliflozin], and Penicillin g    Review of Systems   Review of Systems  Physical Exam Updated Vital Signs BP (!) 154/100   Pulse 68   Temp 97.7 F (36.5 C)   Resp (!) 23   SpO2 99%   Physical Exam Vitals reviewed.  HENT:     Head: Normocephalic.  Cardiovascular:     Rate and Rhythm: Normal rate. Rhythm irregular.     Heart sounds: Normal heart sounds.  Pulmonary:     Effort: Pulmonary effort is normal.  Chest:     Comments: Pacemaker left anterior chest with no surrounding tenderness, redness, or fluctuance Well-healed midline scar Abdominal:     General: Bowel sounds are normal.     Palpations: Abdomen is soft.     Comments: Some epigastric tenderness to palpation Bowel sounds are present No rebound or rigidity noted  Musculoskeletal:        General: Normal range of motion.     Cervical back: Normal range of motion.     Right lower leg: No edema.     Left lower leg: No edema.     Comments: Dorsal pedalis pulses palpable right not on the left they are dopplerable on the right and left  Skin:    General: Skin is warm and dry.     Capillary Refill: Capillary refill takes less than 2 seconds.  Neurological:     Mental Status: She is alert.     ED Results / Procedures / Treatments   Labs (all labs ordered are listed, but only abnormal results are displayed) Labs Reviewed  BASIC METABOLIC PANEL - Abnormal; Notable for the following components:      Result Value   Chloride 112 (*)    CO2 18 (*)    Glucose, Bld 107 (*)    Creatinine, Ser 1.27 (*)    GFR, Estimated 43 (*)    All other components within normal limits  CBC - Abnormal; Notable for the following components:   RBC 5.35 (*)    Hemoglobin 15.9 (*)    HCT 50.0 (*)    RDW 17.0 (*)    Platelets 133 (*)    All other components within normal limits  HEPATIC FUNCTION PANEL - Abnormal; Notable for the following components:   Albumin 3.0 (*)    Total Bilirubin 1.8 (*)    Bilirubin, Direct 0.5 (*)    Indirect Bilirubin 1.3 (*)    All other components within normal limits  I-STAT CHEM 8, ED - Abnormal; Notable for the following components:   Potassium 6.6 (*)    Chloride 114 (*)    BUN 41 (*)     Creatinine, Ser 1.20 (*)    Glucose, Bld 103 (*)    Calcium, Ion 0.92 (*)    TCO2 21 (*)    Hemoglobin 15.6 (*)    All other components within normal limits  TROPONIN I (HIGH SENSITIVITY) - Abnormal; Notable for the following components:   Troponin I (High Sensitivity) 20 (*)    All other components within normal limits  TROPONIN I (HIGH  SENSITIVITY) - Abnormal; Notable for the following components:   Troponin I (High Sensitivity) 23 (*)    All other components within normal limits  LIPASE, BLOOD    EKG EKG Interpretation  Date/Time:  Wednesday Jan 11 2023 08:48:01 EDT Ventricular Rate:  68 PR Interval:    QRS Duration: 94 QT Interval:  410 QTC Calculation: 435 R Axis:   63 Text Interpretation: Atrial fibrillation with frequent ventricular-paced complexes Marked ST abnormality, possible inferior subendocardial injury Abnormal ECG When compared with ECG of 11-Jan-2023 08:26, PREVIOUS ECG IS PRESENT no significant change since ekg earlier today Confirmed by Margarita Grizzle [phone]) on 01/11/2023 9:40:16 AM  Radiology CT ABDOMEN PELVIS WO CONTRAST  Result Date: 01/11/2023 CLINICAL DATA:  Abdominal pain, acute, nonlocalized. Patient states pain began in the abdomen but now radiates to the chest. EXAM: CT ABDOMEN AND PELVIS WITHOUT CONTRAST TECHNIQUE: Multidetector CT imaging of the abdomen and pelvis was performed following the standard protocol without IV contrast. RADIATION DOSE REDUCTION: This exam was performed according to the departmental dose-optimization program which includes automated exposure control, adjustment of the mA and/or kV according to patient size and/or use of iterative reconstruction technique. COMPARISON:  Acute abdominal series 09/06/2022. CT of the abdomen and pelvis without contrast 12/23/2021. FINDINGS: Lower chest: The lung bases are clear without focal nodule, mass, or airspace disease. Heart is mildly enlarged. Pacing wires are stable. No significant pleural  or pericardial effusion is present. Hepatobiliary: No focal liver abnormality is seen. No gallstones, gallbladder wall thickening, or biliary dilatation. Pancreas: Unremarkable. No pancreatic ductal dilatation or surrounding inflammatory changes. Spleen: Normal in size without focal abnormality. Adrenals/Urinary Tract: Adrenal glands are normal bilaterally. Kidneys are unremarkable. No stone or mass lesion is present. The ureters are within normal limits bilaterally. The urinary bladder is unremarkable. Stomach/Bowel: The stomach and duodenum are within normal limits. The small bowel is unremarkable. Terminal ileum is within normal limits. The appendix is visualized and normal. The ascending and transverse colon are within normal limits. Diverticular present in the descending colon without focal inflammatory changes. Sigmoid colon is unremarkable. Vascular/Lymphatic: Extensive atherosclerotic calcifications are again noted. Dense calcifications are present in the distal abdominal aorta and proximal iliac vessels with suspected stenosis bilaterally. Calcifications are similar the prior exam. Reproductive: Calcified uterine fibroids are again noted. The right adnexal soft tissue mass is again noted, stable. Left adnexa is unremarkable. Other: No abdominal wall hernia or abnormality. No abdominopelvic ascites. Musculoskeletal: A prominent superior endplate Schmorl's node at L5 demonstrates surrounding sclerotic changes. A superior endplate Schmorl's node at T12 also demonstrates chronic surrounding bone changes. Vertebral body heights are otherwise normal. No other focal osseous lesions are present. The bony pelvis is within normal limits. The hips are located and within normal limits bilaterally. IMPRESSION: 1. No acute or focal lesion to explain the patient's symptoms. 2. Descending and sigmoid diverticulosis without diverticulitis. 3. Stable right adnexal soft tissue mass. This is likely benign. Recommend follow-up  ultrasound in 1 year. 4. Calcified uterine fibroids. 5. Superior endplate Schmorl's nodes at T12 and L5. Both demonstrate surrounding bone sclerosis. Recommend follow-up ultrasound in 1 year. 6.  Aortic Atherosclerosis (ICD10-I70.0). Electronically Signed   By: Marin Roberts M.D.   On: 01/11/2023 11:06   DG Chest 2 View  Result Date: 01/11/2023 CLINICAL DATA:  Chest pain. EXAM: CHEST - 2 VIEW COMPARISON:  12/29/2022. FINDINGS: Left chest pacemaker with single lead projecting over the right ventricle. No consolidation or pulmonary edema. Stable mild cardiomegaly and tortuosity of  the thoracic aorta with postoperative changes of median sternotomy, CABG and left atrial appendage clipping. No pleural effusion or pneumothorax. IMPRESSION: No evidence of acute cardiopulmonary disease. Electronically Signed   By: Orvan Falconer M.D.   On: 01/11/2023 09:21    Procedures Procedures  {Document cardiac monitor, telemetry assessment procedure when appropriate:1}  Medications Ordered in ED Medications  nitroGLYCERIN (NITROSTAT) SL tablet 0.4 mg (0.4 mg Sublingual Given 01/11/23 1036)  methylPREDNISolone sodium succinate (SOLU-MEDROL) 125 mg/2 mL injection 125 mg (125 mg Intravenous Given 01/11/23 1026)    ED Course/ Medical Decision Making/ A&P   {   Click here for ABCD2, HEART and other calculatorsREFRESH Note before signing :1}                          Medical Decision Making Amount and/or Complexity of Data Reviewed Labs: ordered. Radiology: ordered.  Risk Prescription drug management.   Pain resolved with nitro  77 year old female history of coronary artery disease, status post bypass grafting presents today complaining of upper abdominal pain and chest pain. Patient has some tenderness in epigastric region. Differential diagnosis includes but is not limited to acute coronary syndrome, other diseases of the chest including aneurysm, dissection, lung infection, esophagitis, diseases of  the upper abdomen including pancreatitis, cholecystitis, gastritis, and other intra-abdominal etiologies Patient's pain has resolved here with nitroglycerin EKG with some ST depression on intrinsic beats Troponin slightly elevated at 23.  Patient pain 3 3 since nitro CBC reviewed interpreted within normal limits Bilirubin is slightly elevated at 1.8 CT of the abdomen did not show any evidence of acute abnormality and pain has resolved since nitro Patient has some hypochloremia and slightly elevated creatinine at 1.27 otherwise electrolytes and glucose within normal limits Of note i-STAT was performed and appears a bit hemolyzed potassium increased at 6.6, however is normal on basic metabolic panel. Plan admission for ongoing evaluation of chest pain and high risk patient with cardiac appearing chest pain. She may require further evaluation of her upper abdominal pain if she continues to have pain but this also has resolved with the nitroglycerin so likely from cardiac etiology Discussed with cardiology and they will see in consultation cardiology has seen and evaluated and advised patient can be discharged.  {Document critical care time when appropriate:1} {Document review of labs and clinical decision tools ie heart score, Chads2Vasc2 etc:1}  {Document your independent review of radiology images, and any outside records:1} {Document your discussion with family members, caretakers, and with consultants:1} {Document social determinants of health affecting pt's care:1} {Document your decision making why or why not admission, treatments were needed:1} Final Clinical Impression(s) / ED Diagnoses Final diagnoses:  None    Rx / DC Orders ED Discharge Orders     None

## 2023-01-12 ENCOUNTER — Ambulatory Visit: Payer: Medicare Other | Attending: Internal Medicine | Admitting: Internal Medicine

## 2023-01-12 ENCOUNTER — Encounter: Payer: Self-pay | Admitting: Internal Medicine

## 2023-01-12 ENCOUNTER — Telehealth (HOSPITAL_COMMUNITY): Payer: Self-pay | Admitting: Emergency Medicine

## 2023-01-12 VITALS — BP 132/68 | HR 67 | Ht 67.5 in | Wt 176.0 lb

## 2023-01-12 DIAGNOSIS — I10 Essential (primary) hypertension: Secondary | ICD-10-CM

## 2023-01-12 DIAGNOSIS — I5042 Chronic combined systolic (congestive) and diastolic (congestive) heart failure: Secondary | ICD-10-CM

## 2023-01-12 DIAGNOSIS — I4819 Other persistent atrial fibrillation: Secondary | ICD-10-CM | POA: Diagnosis not present

## 2023-01-12 NOTE — Patient Instructions (Addendum)
Medication Instructions:  Your physician recommends that you continue on your current medications as directed. Please refer to the Current Medication list given to you today.  *If you need a refill on your cardiac medications before your next appointment, please call your pharmacy*  Lab Work: None ordered.  If you have labs (blood work) drawn today and your tests are completely normal, you will receive your results only by: MyChart Message (if you have MyChart) OR A paper copy in the mail If you have any lab test that is abnormal or we need to change your treatment, we will call you to review the results.  Testing/Procedures: None ordered.  Follow-Up: At [facility], you and your health needs are our priority.  As part of our continuing mission to provide you with exceptional heart care, we have created designated Provider Care Teams.  These Care Teams include your primary Cardiologist (physician) and Advanced Practice Providers (APPs -  Physician Assistants and Nurse Practitioners) who all work together to provide you with the care you need, when you need it.  We recommend signing up for the patient portal called "MyChart".  Sign up information is provided on this After Visit Summary.  MyChart is used to connect with patients for Virtual Visits (Telemedicine).  Patients are able to view lab/test results, encounter notes, upcoming appointments, etc.  Non-urgent messages can be sent to your provider as well.   To learn more about what you can do with MyChart, go to [email or web address].    Your next appointment:   1 year(s)  Dr. Lewayne Bunting will consult Dr. Gala Romney, regarding future plan of care orders.   The format for your next appointment:   In Person  Provider:   Lewayne Bunting, MD{or one of the following Advanced Practice Providers on your designated Care Team:   Francis Dowse, [place] Casimiro Needle "Mardelle Matte" Lanna Poche, [place]  Remote monitoring is used to monitor your Pacemaker from home.  This monitoring reduces the number of office visits required to check your device to one time per year. It allows [place] to keep an eye on the functioning of your device to ensure it is working properly. You are scheduled for a device check from home on 6/4. You may send your transmission at any time that day. If you have a wireless device, the transmission will be sent automatically. After your physician reviews your transmission, you will receive a postcard with your next transmission date.

## 2023-01-12 NOTE — Telephone Encounter (Signed)
Pt called to cancel today's appointment due to conflict with another doctor's appointment.  Rescheduled home visit for tomorrow 5/31 @ 2:00.    [name], EMT-Paramedic [phone] 01/12/2023

## 2023-01-12 NOTE — Progress Notes (Signed)
HPI [name] returns for followup. She is a pleasant 77 yo woman with uncontrolled atrial fib and what was initially thought a tachy induced CM s/p PPM insertion and AV node ablation. She did not have improvement in her LV function despite rate control. She has had some return in her AV conduction. I tried to upgrade her to a biv ICD but she was found to have an occluded left subclavian vein. She has refused an extraction/upgrade. When I saw her last we increased her paced rate to 60 and she is a little better and her QRS is actually narrow when she comes in on her own. No syncope. Repeat echo showed an EF of 25% and PA pressure in the 50's.  Allergies  Allergen Reactions   Bee Venom Anaphylaxis   Ivp Dye [Iodinated Contrast Media] Anaphylaxis   Ace Inhibitors     angioedema   Atenolol     severe headaches   Codeine Nausea And Vomiting   Entresto [Sacubitril-Valsartan] Other (See Comments)    Chest pain    Isosorbide     Severe headaches   Shrimp [Shellfish Allergy] Swelling   Simvastatin     not sure   Jardiance [Empagliflozin] Other (See Comments)    Caused Boils    Penicillin G Rash     Current Outpatient Medications  Medication Sig Dispense Refill   acetaminophen (TYLENOL) 325 MG tablet Take 2 tablets (650 mg total) by mouth every 4 (four) hours as needed for headache or mild pain.     allopurinol (ZYLOPRIM) 100 MG tablet Take 100 mg by mouth daily.     atorvastatin (LIPITOR) 10 MG tablet Take 1 tablet (10 mg total) by mouth at bedtime. 90 tablet 1   gabapentin (NEURONTIN) 100 MG capsule Take 1 capsule (100 mg total) by mouth at bedtime. 90 capsule 0   hydrALAZINE (APRESOLINE) 25 MG tablet Take 2 tablets (50 mg total) by mouth 3 (three) times daily. 180 tablet 1   losartan (COZAAR) 50 MG tablet Take 0.5 tablets (25 mg total) by mouth daily. 30 tablet 6   metFORMIN (GLUCOPHAGE) 500 MG tablet TAKE 1 TABLET (500 MG TOTAL) BY MOUTH DAILY WITH BREAKFAST (MEAL) (Patient  taking differently: Take 500 mg by mouth daily with breakfast.) 90 tablet 0   Naphazoline HCl (CLEAR EYES OP) Place 1 drop into both eyes daily as needed (irritation).     pantoprazole (PROTONIX) 40 MG tablet Take 1 tablet (40 mg total) by mouth daily. Follow-up appt due in Marcht must see provider for future refills 90 tablet 0   Rivaroxaban (XARELTO) 15 MG TABS tablet Take 1 tablet (15 mg total) by mouth daily with supper. 30 tablet 8   spironolactone (ALDACTONE) 25 MG tablet Take 1 tablet (25 mg total) by mouth daily. 30 tablet 11   sucralfate (CARAFATE) 1 GM/10ML suspension Take 10 mLs (1 g total) by mouth 4 (four) times daily -  with meals and at bedtime. 420 mL 0   torsemide (DEMADEX) 20 MG tablet Take 1 tablet (20 mg total) by mouth daily. 90 tablet 3   TRELEGY ELLIPTA 100-62.5-25 MCG/ACT AEPB Inhale ONE PUFF into THE lungs daily 120 each 1   No current facility-administered medications for this visit.     Past Medical History:  Diagnosis Date   (HFimpEF) heart failure with improved ejection fraction (HCC)    a. 07/2018 Echo: EF 30-35%; b. 11/2019 Echo: EF 55-60%, no rwma, Gr2 DD, Nl RV size/fxn.  Mild BAE. Mild MR/AI.   Arthritis    Asthma    Atopic dermatitis    CAD (coronary artery disease)    a. 2016 s/p CABG x 2 (LIMA->LAD, VG->OM); b. 08/2018 MV: EF 44%, no ischemia/infact.   Cardiac arrest (HCC) 10/2014   Cardiomyopathy, ischemic    a. 07/2018 Echo: EF 30-35%; 11/2019 Echo: EF 55-60%.   Carotid arterial disease (HCC)    a. 11/2019 Carotid U/S   CHF (congestive heart failure) (HCC)    CKD (chronic kidney disease), stage III (HCC)    COPD (chronic obstructive pulmonary disease) (HCC)    Diabetes mellitus without complication (HCC)    Dysrhythmia    Esophageal dilatation 2013   GERD (gastroesophageal reflux disease)    Glaucoma    Gout    Headache    Hypertension    Hypokalemia    Idiopathic angioedema    LGI bleed 08/06/2017   a. felt to be hemorrhoidal during that  admission (no drop in Hgb).   Lower back pain    Paroxysmal atrial fibrillation (HCC)    a. Dx 2016-->h/o difficult to control rates (complicated by noncompliance), not felt to be a candidate for ablation or antiarrhythmic due to noncompliance; b. Recurrent AF 2021 - converted w/ IV dilt; c. CHA2DS2VASc = 7-->Xarelto.   Personal history of noncompliance with medical treatment, presenting hazards to health    Prediabetes    Presence of permanent cardiac pacemaker    S/P CABG x 2 with clipping of LA appendage 11/14/2014   LIMA to LAD, SVG to OM, EVH via right thigh   Urine incontinence    Uterine fibroid     ROS:   All systems reviewed and negative except as noted in the HPI.   Past Surgical History:  Procedure Laterality Date   AV NODE ABLATION N/A 01/18/2021   Procedure: AV NODE ABLATION;  Surgeon: [name], MD;  [facility] INVASIVE CV LAB;  Service: Cardiovascular;  Laterality: N/A;   BALLOON DILATION N/A 10/10/2019   Procedure: BALLOON DILATION;  Surgeon: Kathi Der, MD;  Location: [facility] ENDOSCOPY;  Service: Gastroenterology;  Laterality: N/A;   BIOPSY  10/10/2019   Procedure: BIOPSY;  Surgeon: Kathi Der, MD;  [facility]: [facility];  Service: Gastroenterology;;   BIOPSY  10/11/2019   Procedure: BIOPSY;  Surgeon: Kathi Der, MD;  Location: [facility];  Service: Gastroenterology;;   Earnstine Regal N/A 11/21/2022   Procedure: BIVI  ICD UPGRADE;  Surgeon: [name], MD;  Location[facility] INVASIVE CV LAB;  Service: Cardiovascular;  Laterality: N/A;   CARDIOVERSION N/A 11/18/2014   Procedure: CARDIOVERSION;  Surgeon: Chrystie Nose, MD;  Location: [facility] OR;  Service: Cardiovascular;  Laterality: N/A;   CARDIOVERSION N/A 12/25/2017   Procedure: CARDIOVERSION;  Surgeon: Dolores Patty, MD;  Location: [facility] ENDOSCOPY;  Service: Cardiovascular;  Laterality: N/A;   CLIPPING OF ATRIAL APPENDAGE N/A 11/14/2014   Procedure: CLIPPING OF ATRIAL APPENDAGE;  Surgeon:  Purcell Nails, MD;  Location: [facility] OR;  Service: Open Heart Surgery;  Laterality: N/A;   COLONOSCOPY  2013   COLONOSCOPY WITH PROPOFOL N/A 10/11/2019   Procedure: COLONOSCOPY WITH PROPOFOL;  Surgeon: Kathi Der, MD;  [facility]OS[facility];  Service: Gastroenterology;  Laterality: N/A;   CORONARY ARTERY BYPASS GRAFT N/A 11/14/2014   Procedure: CORONARY ARTERY BYPASS GRAFTING (CABG)TIMES 2 USING LEFT INTERNAL MAMMARY ARTERY AND RIGHT SAPHENOUS VEIN HARVESTED ENDOSCOPICALLY;  Surgeon: Purcell Nails, MD;  Location: [facility] OR;  Service: Open Heart Surgery;  Laterality: N/A;   ESOPHAGOGASTRODUODENOSCOPY (EGD) WITH PROPOFOL N/A 10/10/2019   Procedure: ESOPHAGOGASTRODUODENOSCOPY (EGD) WITH PROPOFOL;  Surgeon: Kathi Der, MD;  [facility];  Service: Gastroenterology;  Laterality: N/A;   ESOPHAGOGASTRODUODENOSCOPY (EGD) WITH PROPOFOL N/A 10/27/2019   Procedure: ESOPHAGOGASTRODUODENOSCOPY (EGD) WITH PROPOFOL;  Surgeon: Kerin Salen, MD;  Location: [facility] ENDOSCOPY;  Service: Gastroenterology;  Laterality: N/A;   GIVENS CAPSULE STUDY N/A 10/27/2019   Procedure: GIVENS CAPSULE STUDY;  Surgeon: Kerin Salen, MD;  Location: [facility] ENDOSCOPY;  Service: Gastroenterology;  Laterality: N/A;   INSERT / REPLACE / REMOVE PACEMAKER     LEFT HEART CATHETERIZATION WITH CORONARY ANGIOGRAM N/A 11/04/2014   Procedure: LEFT HEART CATHETERIZATION WITH CORONARY ANGIOGRAM;  Surgeon: Lennette Bihari, MD;  Location: [facility] CATH LAB;  Service: Cardiovascular;  Laterality: N/A;   PACEMAKER IMPLANT N/A 01/18/2021   Procedure: PACEMAKER IMPLANT;  Surgeon: [name], MD;  [facility];  Service: Cardiovascular;  Laterality: N/A;   PACEMAKER IMPLANT Left    POLYPECTOMY  10/11/2019   Procedure: POLYPECTOMY;  Surgeon: Kathi Der, MD;  Location: [facility] ENDOS[facility];  Service: Gastroenterology;;   RIGHT HEART CATH N/A 06/08/2022   Procedure: RIGHT HEART CATH;  Surgeon: Dolores Patty, MD;  Location: [facility]  INVASIVE CV LAB;  Service: Cardiovascular;  Laterality: N/A;   TEE WITHOUT CARDIOVERSION N/A 11/14/2014   Procedure: TRANSESOPHAGEAL ECHOCARDIOGRAM (TEE);  Surgeon: Purcell Nails, MD;  Location: [facility] OR;  Service: Open Heart Surgery;  Laterality: N/A;   TEE WITHOUT CARDIOVERSION N/A 12/25/2017   Procedure: TRANSESOPHAGEAL ECHOCARDIOGRAM (TEE);  Surgeon: Dolores Patty, MD;  Location: [facility] ENDOSCOPY;  Service: Cardiovascular;  Laterality: N/A;   TEMPORARY PACEMAKER INSERTION  11/04/2014   Procedure: TEMPORARY PACEMAKER INSERTION;  Surgeon: Lennette Bihari, MD;  Location: [facility] CATH LAB;  Service: Cardiovascular;;   TOOTH EXTRACTION       Family History  Problem Relation Age of Onset   Cancer Mother        LYMPHOMA   Heart disease Father    Other Father        TB   CVA Sister    Prostate cancer Brother 71   Diabetes Brother      Social History   Socioeconomic History   Marital status: Widowed    Spouse name: Not on file   Number of children: 1   Years of education: 28   Highest education level: Not on file  Occupational History   Occupation: RETIRED [employer]  Tobacco Use   Smoking status: Former    Packs/day: 0.50    Years: 56.00    Additional pack years: 0.00    Total pack years: 28.00    Types: Cigarettes    Quit date: 11/13/2021    Years since quitting: 1.1   Smokeless tobacco: Never   Tobacco comments:    smoking 2 cigarettes/day as of 10/23/20  Vaping Use   Vaping Use: Never used  Substance and Sexual Activity   Alcohol use: No    Alcohol/week: 0.0 standard drinks of alcohol   Drug use: No   Sexual activity: Not on file  Other Topics Concern   Not on file  Social History Narrative   Patient reports it being difficult to pay for everything due to outstanding medical bills from hospital stay last year but states she is able to keep up with basic expenses.  Patient does have some concerns with her house's condition due to a water leak- had her roof  repaired last month but has some leaking in the front which has affected her porch.  Patient owns her own car and is able to drive herself but has some concerns about the reliability of her car- has gotten taxi to the clinic for appointment in the past when her car broke down.   Social Determinants of Health   Financial Resource Strain: Low Risk  (06/07/2018)   Overall Financial Resource Strain (CARDIA)    Difficulty of Paying Living Expenses: Not hard at all  Recent Concern: Financial Resource Strain - Medium Risk (05/21/2018)   Overall Financial Resource Strain (CARDIA)    Difficulty of Paying Living Expenses: Somewhat hard  Food Insecurity: No Food Insecurity (12/15/2022)   Hunger Vital Sign    Worried About Running Out of Food in the Last Year: Never true    Ran Out of Food in the Last Year: Never true  Transportation Needs: No Transportation Needs (12/15/2022)   PRAPARE - Administrator, Civil Service (Medical): No    Lack of Transportation (Non-Medical): No  Physical Activity: Not on file  Stress: Not on file  Social Connections: Not on file  Intimate Partner Violence: Not At Risk (06/09/2022)   Humiliation, Afraid, Rape, and Kick questionnaire    Fear of Current or Ex-Partner: No    Emotionally Abused: No    Physically Abused: No    Sexually Abused: No     BP 132/68   Pulse 67   Ht 5' 7.5" (1.715 m)   Wt 176 lb (79.8 kg)   SpO2 97%   BMI 27.16 kg/m   Physical Exam:  Well appearing NAD HEENT: Unremarkable Neck:  No JVD, no thyromegally Lymphatics:  No adenopathy Back:  No CVA tenderness Lungs:  Clear with no wheezes or rales HEART:  IRegular rate rhythm, no murmurs, no rubs, no clicks Abd:  soft, positive bowel sounds, no organomegally, no rebound, no guarding Ext:  2 plus pulses, no edema, no cyanosis, no clubbing Skin:  No rashes no nodules Neuro:  CN II through XII intact, motor grossly intact  EKG - atrial fib with pacing and sensing  DEVICE   Normal device function.  See PaceArt for details.   Assess/Plan: Chronic systolic heart failure - continue GDMT under Dr. Dorthea Cove. Atrial fib - her rates are controlled. She is now pacing about 37% in the ventricle.  PPM - At this point I am not sure a biv upgrade is likely to benefit as her rates are better, her sensed QRS is narrow and her paced measures about 120 ms. 4. Coags - she has not had any bleeding on Xarelto. Continue.  Sharlot Gowda Marybell Robards,MD

## 2023-01-13 ENCOUNTER — Telehealth (HOSPITAL_COMMUNITY): Payer: Self-pay

## 2023-01-13 ENCOUNTER — Other Ambulatory Visit (HOSPITAL_COMMUNITY): Payer: Self-pay | Admitting: Emergency Medicine

## 2023-01-13 NOTE — Telephone Encounter (Signed)
-----   Message from Jacklynn Ganong, [place] sent at 01/13/2023  3:47 PM EDT ----- Regarding: RE: Med change Thanks for heads up [name].  She can take extra 20 mg torsemide x 2 days to see if this helps. I will forward to the pool  ----- Message ----- From: [name], Paramedic Sent: 01/13/2023   3:37 PM EDT To: Jacklynn Ganong, FNP Subject: Med change                                     Per our conversation in clinic today, Ms. Mcrae is up 4.5lbs since last visit, you advise she is to take an additional 20mg  (40mg  total) Torsemide for the next two days then return to 20mg  daily.  Thanks for your assistance,    [name], EMT-Paramedic [phone] 01/13/2023

## 2023-01-13 NOTE — Progress Notes (Signed)
Paramedicine Encounter    Patient ID: [name] [name], female    DOB: March 27, 1946, 77 y.o.   MRN: [id number]   Complaints "tired"  Assessment A&O x 4, skin W&D w/ good color  Compliance with meds  Pill box filled x 1 week  Refills needed Atrovastatin, Spironolactone, Torsemide  Meds changes since last visit none    Social changes none   BP 120/70 (BP Location: Left Arm, Patient Position: Sitting, Cuff Size: Normal)   Pulse 60   Resp 14   Wt 176 lb 6.4 oz (80 kg)   SpO2 96%   BMI 27.22 kg/m  Weight yesterday- not taken Last visit weight-172lb  Home visit for Ms. Dolin today w/ med reconciliation x 1 week.  Pt.'s weight is up 4.5lb from last visit.  She denies chest pain or SOB and exhibits no peripheral edema.  Due to it being concerned about the weight gain and it being the weekend I spoke with [facility] and received orders for Ms. Ducheneaux to take and extra Torsemide today and tomorrow (40mg  total.) then revert back to 20mg  daily.  I relayed this information to Ms. Ingalls and she verbalizes she understands same.   ACTION: Home visit completed  [name] [phone] 01/13/23  Patient Care Team: [name], MD as PCP - General (Internal Medicine) [name], MD as PCP - Cardiology (Cardiology) [name], MD as PCP - Electrophysiology (Cardiology) Bensimhon, [name], MD as PCP - Advanced Heart Failure (Cardiology) Chrystie Nose, MD as Consulting Physician (Cardiology)  Patient Active Problem List   Diagnosis Date Noted   Dysuria 12/29/2022   Cough productive of purulent sputum 12/29/2022   Type 2 diabetes mellitus with hyperlipidemia (HCC) 12/14/2022   Acute cough 12/12/2022   Ceruminosis, bilateral 06/17/2022   Flu vaccine need 06/17/2022   Chronic combined systolic and diastolic CHF (congestive heart failure) (HCC) 06/17/2022   Encounter for general adult medical examination with abnormal findings 06/16/2022   Chronic  anticoagulation 06/06/2022   Mitral regurgitation 06/06/2022   Controlled type 2 diabetes mellitus without complication, without long-term current use of insulin (HCC) 06/06/2022   Pacemaker 02/22/2022   Hypertensive urgency 06/08/2021   Obstructive sleep apnea 09/21/2020   Obesity (BMI 30.0-34.9) 02/10/2020   COPD (chronic obstructive pulmonary disease) (HCC) 10/26/2019   Thrombocytopenia (HCC) 09/14/2017   Medication noncompliance due to cognitive impairment 09/06/2017   Persistent atrial fibrillation (HCC) 08/04/2017   Cardiomyopathy, ischemic 02/08/2017   CKD (chronic kidney disease), stage III (HCC) 01/30/2017   Coronary artery disease due to lipid rich plaque    Essential hypertension     Current Outpatient Medications:    acetaminophen (TYLENOL) 325 MG tablet, Take 2 tablets (650 mg total) by mouth every 4 (four) hours as needed for headache or mild pain., Disp: , Rfl:    allopurinol (ZYLOPRIM) 100 MG tablet, Take 100 mg by mouth daily., Disp: , Rfl:    atorvastatin (LIPITOR) 10 MG tablet, Take 1 tablet (10 mg total) by mouth at bedtime., Disp: 90 tablet, Rfl: 1   gabapentin (NEURONTIN) 100 MG capsule, Take 1 capsule (100 mg total) by mouth at bedtime., Disp: 90 capsule, Rfl: 0   hydrALAZINE (APRESOLINE) 25 MG tablet, Take 2 tablets (50 mg total) by mouth 3 (three) times daily., Disp: 180 tablet, Rfl: 1   losartan (COZAAR) 50 MG tablet, Take 0.5 tablets (25 mg total) by mouth daily., Disp: 30 tablet, Rfl: 6   metFORMIN (GLUCOPHAGE) 500 MG tablet,  TAKE 1 TABLET (500 MG TOTAL) BY MOUTH DAILY WITH BREAKFAST (MEAL) (Patient taking differently: Take 500 mg by mouth daily with breakfast.), Disp: 90 tablet, Rfl: 0   Naphazoline HCl (CLEAR EYES OP), Place 1 drop into both eyes daily as needed (irritation)., Disp: , Rfl:    pantoprazole (PROTONIX) 40 MG tablet, Take 1 tablet (40 mg total) by mouth daily. Follow-up appt due in Marcht must see provider for future refills, Disp: 90 tablet, Rfl:  0   Rivaroxaban (XARELTO) 15 MG TABS tablet, Take 1 tablet (15 mg total) by mouth daily with supper., Disp: 30 tablet, Rfl: 8   spironolactone (ALDACTONE) 25 MG tablet, Take 1 tablet (25 mg total) by mouth daily., Disp: 30 tablet, Rfl: 11   torsemide (DEMADEX) 20 MG tablet, Take 1 tablet (20 mg total) by mouth daily., Disp: 90 tablet, Rfl: 3   TRELEGY ELLIPTA 100-62.5-25 MCG/ACT AEPB, Inhale ONE PUFF into THE lungs daily, Disp: 120 each, Rfl: 1   sucralfate (CARAFATE) 1 GM/10ML suspension, Take 10 mLs (1 g total) by mouth 4 (four) times daily -  with meals and at bedtime. (Patient not taking: Reported on 01/13/2023), Disp: 420 mL, Rfl: 0 Allergies  Allergen Reactions   Bee Venom Anaphylaxis   Ivp Dye [Iodinated Contrast Media] Anaphylaxis   Ace Inhibitors     angioedema   Atenolol     severe headaches   Codeine Nausea And Vomiting   Entresto [Sacubitril-Valsartan] Other (See Comments)    Chest pain    Isosorbide     Severe headaches   Shrimp [Shellfish Allergy] Swelling   Simvastatin     not sure   Jardiance [Empagliflozin] Other (See Comments)    Caused Boils    Penicillin G Rash     Social History   Socioeconomic History   Marital status: Widowed    Spouse name: Not on file   Number of children: 1   Years of education: 52   Highest education level: Not on file  Occupational History   Occupation: RETIRED [employer]  Tobacco Use   Smoking status: Former    Packs/day: 0.50    Years: 56.00    Additional pack years: 0.00    Total pack years: 28.00    Types: Cigarettes    Quit date: 11/13/2021    Years since quitting: 1.1   Smokeless tobacco: Never   Tobacco comments:    smoking 2 cigarettes/day as of 10/23/20  Vaping Use   Vaping Use: Never used  Substance and Sexual Activity   Alcohol use: No    Alcohol/week: 0.0 standard drinks of alcohol   Drug use: No   Sexual activity: Not on file  Other Topics Concern   Not on file  Social History Narrative    Patient reports it being difficult to pay for everything due to outstanding medical bills from hospital stay last year but states she is able to keep up with basic expenses.  Patient does have some concerns with her house's condition due to a water leak- had her roof repaired last month but has some leaking in the front which has affected her porch.  Patient owns her own car and is able to drive herself but has some concerns about the reliability of her car- has gotten taxi to the clinic for appointment in the past when her car broke down.   Social Determinants of Health   Financial Resource Strain: Low Risk  (06/07/2018)   Overall Financial Resource Strain (CARDIA)  Difficulty of Paying Living Expenses: Not hard at all  Recent Concern: Financial Resource Strain - Medium Risk (05/21/2018)   Overall Financial Resource Strain (CARDIA)    Difficulty of Paying Living Expenses: Somewhat hard  Food Insecurity: No Food Insecurity (12/15/2022)   Hunger Vital Sign    Worried About Running Out of Food in the Last Year: Never true    Ran Out of Food in the Last Year: Never true  Transportation Needs: No Transportation Needs (12/15/2022)   PRAPARE - Administrator, Civil Service (Medical): No    Lack of Transportation (Non-Medical): No  Physical Activity: Not on file  Stress: Not on file  Social Connections: Not on file  Intimate Partner Violence: Not At Risk (06/09/2022)   Humiliation, Afraid, Rape, and Kick questionnaire    Fear of Current or Ex-Partner: No    Emotionally Abused: No    Physically Abused: No    Sexually Abused: No    Physical Exam      Future Appointments  Date Time Provider Department Center  01/17/2023  7:00 AM [facility] DEVICE REMOTES [facility]  01/20/2023 11:30 AM [facility] NURSE [facility] None  02/06/2023  1:15 PM [facility] LAB [facility]  02/07/2023 11:00 AM Iran Ouch, MD [facility] None  03/07/2023  2:00 PM [name], MD [facility]  04/06/2023  1:00 PM [facility] ECHO OP 1 [facility]  04/06/2023  2:00 PM Bensimhon, [name], MD [facility] None  04/18/2023  7:05 AM [facility] DEVICE REMOTES [facility]  07/18/2023  7:00 AM [facility] DEVICE REMOTES [facility]  10/17/2023  7:00 AM [facility] DEVICE REMOTES [facility]

## 2023-01-13 NOTE — Telephone Encounter (Signed)
Tried calling patient, no answer, unable to leave message. 

## 2023-01-17 ENCOUNTER — Ambulatory Visit (INDEPENDENT_AMBULATORY_CARE_PROVIDER_SITE_OTHER): Payer: Medicare Other

## 2023-01-17 DIAGNOSIS — I4819 Other persistent atrial fibrillation: Secondary | ICD-10-CM

## 2023-01-18 ENCOUNTER — Telehealth: Payer: Self-pay | Admitting: *Deleted

## 2023-01-18 LAB — CUP PACEART REMOTE DEVICE CHECK
Battery Voltage: 80
Date Time Interrogation Session: 20240605083201
Implantable Lead Connection Status: 753985
Implantable Lead Implant Date: 20220606
Implantable Lead Location: 753860
Implantable Lead Model: 377169
Implantable Lead Serial Number: 8000396500
Implantable Pulse Generator Implant Date: 20220606
Pulse Gen Model: 407145
Pulse Gen Serial Number: 70105656

## 2023-01-18 NOTE — Progress Notes (Signed)
  Care Coordination   Note   01/18/2023 Name: [name] [name] MRN: [id number] DOB: 07-Mar-1946  [name] [name] is a 77 y.o. year old female who sees [name], MD for primary care. I reached out to [name] by phone today to offer care coordination services.  [name] was given information about Care Coordination services today including:   The Care Coordination services include support from the care team which includes your Nurse Coordinator, Clinical Social Worker, or Pharmacist.  The Care Coordination team is here to help remove barriers to the health concerns and goals most important to you. Care Coordination services are voluntary, and the patient may decline or stop services at any time by request to their care team member.   Care Coordination Consent Status: Patient agreed to services and verbal consent obtained.   Follow up plan:  Telephone appointment with care coordination team member scheduled for:  01/23/23  Encounter Outcome:  Pt. Scheduled

## 2023-01-19 ENCOUNTER — Other Ambulatory Visit (HOSPITAL_COMMUNITY): Payer: Self-pay | Admitting: Emergency Medicine

## 2023-01-19 DIAGNOSIS — J40 Bronchitis, not specified as acute or chronic: Secondary | ICD-10-CM | POA: Diagnosis not present

## 2023-01-19 DIAGNOSIS — R0602 Shortness of breath: Secondary | ICD-10-CM | POA: Diagnosis not present

## 2023-01-19 NOTE — Progress Notes (Signed)
Paramedicine Encounter    Patient ID: [name] [name], female    DOB: 1946/04/16, 77 y.o.   MRN: [id number]   Complaints 5lb weight gain in 1 day, unable to have bowel movement  Assessment A&O x 4, no peripheral edema noted.  Lower abdomen not rigid or distended. Upper abdomen w/ palpable rigid area  about the side of a softball.  No tenderness upon palpation.  Lung sounds diminished throughout but she denies SOB.    Compliance with meds YES  Pill box filled x 1 week  Refills needed Atorvastatin, Spiro, Torsemide, Xarelto  Meds changes since last visit NONE    Social changes NONE  ATF Ms. Tribbey A&O x 4, skin W&D w/ good color.  She she denies chest pain, SOB or dizziness.  She c/o 5lb weight gain in 2 days.  She blames the weight gain on her inability to have a bowel movement although she did have a normal bowel movement yesterday.  Yesterday's bowel movement was normal in color and no obvious signs of blood.  She denies nausea or vomiting.  She is convinced that her inability to have a bowel movement is due to Dr. Ladona Ridgel "adjusting my device."  I have tried to convince her that these two issues are not related but she says, "I know my body."    BP 110/70 (BP Location: Left Arm, Patient Position: Sitting, Cuff Size: Normal)   Pulse 68   Resp 14   Wt 175 lb (79.4 kg)   SpO2 96%   BMI 27.00 kg/m  Weight yesterday-170lb Last visit weight-176lb  ACTION: {Paramed Action:[phone]}  [name], EMT-Paramedic [phone] 01/20/23  Patient Care Team: [name], MD as PCP - General (Internal Medicine) [name], MD as PCP - Cardiology (Cardiology) [name], MD as PCP - Electrophysiology (Cardiology) Bensimhon, [name], MD as PCP - Advanced Heart Failure (Cardiology) [name] [name], MD as Consulting Physician (Cardiology) [name], LCSW as [facility] Care Management (Licensed Clinical Social Worker)  Patient Active Problem List    Diagnosis Date Noted   Dysuria 12/29/2022   Cough productive of purulent sputum 12/29/2022   Type 2 diabetes mellitus with hyperlipidemia (HCC) 12/14/2022   Acute cough 12/12/2022   Ceruminosis, bilateral 06/17/2022   Flu vaccine need 06/17/2022   Chronic combined systolic and diastolic CHF (congestive heart failure) (HCC) 06/17/2022   Encounter for general adult medical examination with abnormal findings 06/16/2022   Chronic anticoagulation 06/06/2022   Mitral regurgitation 06/06/2022   Controlled type 2 diabetes mellitus without complication, without long-term current use of insulin (HCC) 06/06/2022   Pacemaker 02/22/2022   Hypertensive urgency 06/08/2021   Obstructive sleep apnea 09/21/2020   Obesity (BMI 30.0-34.9) 02/10/2020   COPD (chronic obstructive pulmonary disease) (HCC) 10/26/2019   Thrombocytopenia (HCC) 09/14/2017   Medication noncompliance due to cognitive impairment 09/06/2017   Persistent atrial fibrillation (HCC) 08/04/2017   Cardiomyopathy, ischemic 02/08/2017   CKD (chronic kidney disease), stage III (HCC) 01/30/2017   Coronary artery disease due to lipid rich plaque    Essential hypertension     Current Outpatient Medications:    acetaminophen (TYLENOL) 325 MG tablet, Take 2 tablets (650 mg total) by mouth every 4 (four) hours as needed for headache or mild pain., Disp: , Rfl:    allopurinol (ZYLOPRIM) 100 MG tablet, Take 100 mg by mouth daily., Disp: , Rfl:    gabapentin (NEURONTIN) 100 MG capsule, Take 1 capsule (100 mg total) by mouth at  bedtime., Disp: 90 capsule, Rfl: 0   hydrALAZINE (APRESOLINE) 25 MG tablet, Take 2 tablets (50 mg total) by mouth 3 (three) times daily., Disp: 180 tablet, Rfl: 1   losartan (COZAAR) 50 MG tablet, Take 0.5 tablets (25 mg total) by mouth daily., Disp: 30 tablet, Rfl: 6   metFORMIN (GLUCOPHAGE) 500 MG tablet, TAKE 1 TABLET (500 MG TOTAL) BY MOUTH DAILY WITH BREAKFAST (MEAL) (Patient taking differently: Take 500 mg by mouth daily  with breakfast.), Disp: 90 tablet, Rfl: 0   Naphazoline HCl (CLEAR EYES OP), Place 1 drop into both eyes daily as needed (irritation)., Disp: , Rfl:    pantoprazole (PROTONIX) 40 MG tablet, Take 1 tablet (40 mg total) by mouth daily. Follow-up appt due in Marcht must see provider for future refills, Disp: 90 tablet, Rfl: 0   Rivaroxaban (XARELTO) 15 MG TABS tablet, Take 1 tablet (15 mg total) by mouth daily with supper., Disp: 30 tablet, Rfl: 8   spironolactone (ALDACTONE) 25 MG tablet, Take 1 tablet (25 mg total) by mouth daily., Disp: 30 tablet, Rfl: 11   torsemide (DEMADEX) 20 MG tablet, Take 1 tablet (20 mg total) by mouth daily., Disp: 90 tablet, Rfl: 3   TRELEGY ELLIPTA 100-62.5-25 MCG/ACT AEPB, Inhale ONE PUFF into THE lungs daily, Disp: 120 each, Rfl: 1   atorvastatin (LIPITOR) 10 MG tablet, Take 1 tablet (10 mg total) by mouth at bedtime., Disp: 90 tablet, Rfl: 1   sucralfate (CARAFATE) 1 GM/10ML suspension, Take 10 mLs (1 g total) by mouth 4 (four) times daily -  with meals and at bedtime. (Patient not taking: Reported on 01/13/2023), Disp: 420 mL, Rfl: 0 Allergies  Allergen Reactions   Bee Venom Anaphylaxis   Ivp Dye [Iodinated Contrast Media] Anaphylaxis   Ace Inhibitors     angioedema   Atenolol     severe headaches   Codeine Nausea And Vomiting   Entresto [Sacubitril-Valsartan] Other (See Comments)    Chest pain    Isosorbide     Severe headaches   Shrimp [Shellfish Allergy] Swelling   Simvastatin     not sure   Jardiance [Empagliflozin] Other (See Comments)    Caused Boils    Penicillin G Rash     Social History   Socioeconomic History   Marital status: Widowed    Spouse name: Not on file   Number of children: 1   Years of education: 22   Highest education level: Not on file  Occupational History   Occupation: RETIRED [employer]  Tobacco Use   Smoking status: Former    Packs/day: 0.50    Years: 56.00    Additional pack years: 0.00    Total pack  years: 28.00    Types: Cigarettes    Quit date: 11/13/2021    Years since quitting: 1.1   Smokeless tobacco: Never   Tobacco comments:    smoking 2 cigarettes/day as of 10/23/20  Vaping Use   Vaping Use: Never used  Substance and Sexual Activity   Alcohol use: No    Alcohol/week: 0.0 standard drinks of alcohol   Drug use: No   Sexual activity: Not on file  Other Topics Concern   Not on file  Social History Narrative   Patient reports it being difficult to pay for everything due to outstanding medical bills from hospital stay last year but states she is able to keep up with basic expenses.  Patient does have some concerns with her house's condition due to a  water leak- had her roof repaired last month but has some leaking in the front which has affected her porch.  Patient owns her own car and is able to drive herself but has some concerns about the reliability of her car- has gotten taxi to the clinic for appointment in the past when her car broke down.   Social Determinants of Health   Financial Resource Strain: Low Risk  (06/07/2018)   Overall Financial Resource Strain (CARDIA)    Difficulty of Paying Living Expenses: Not hard at all  Recent Concern: Financial Resource Strain - Medium Risk (05/21/2018)   Overall Financial Resource Strain (CARDIA)    Difficulty of Paying Living Expenses: Somewhat hard  Food Insecurity: No Food Insecurity (12/15/2022)   Hunger Vital Sign    Worried About Running Out of Food in the Last Year: Never true    Ran Out of Food in the Last Year: Never true  Transportation Needs: No Transportation Needs (12/15/2022)   PRAPARE - Administrator, Civil Service (Medical): No    Lack of Transportation (Non-Medical): No  Physical Activity: Not on file  Stress: Not on file  Social Connections: Not on file  Intimate Partner Violence: Not At Risk (06/09/2022)   Humiliation, Afraid, Rape, and Kick questionnaire    Fear of Current or Ex-Partner: No     Emotionally Abused: No    Physically Abused: No    Sexually Abused: No    Physical Exam      Future Appointments  Date Time Provider Department Center  01/20/2023 11:30 AM [facility] NURSE [facility] None  01/23/2023 10:00 AM [name], LCSW [facility] None  02/06/2023  1:15 PM [facility]  02/07/2023 11:00 AM Iran Ouch, MD [facility] None  03/07/2023  2:00 PM [name], MD [facility]  04/06/2023  1:00 PM [facility] ECHO OP 1 [facility]  04/06/2023  2:00 PM Bensimhon, [name], MD [facility] None  04/18/2023  7:05 AM [facility] DEVICE REMOTES [facility]  07/18/2023  7:00 AM [facility] DEVICE REMOTES [facility]  10/17/2023  7:00 AM [facility] DEVICE REMOTES [facility]

## 2023-01-20 ENCOUNTER — Telehealth (HOSPITAL_COMMUNITY): Payer: Self-pay | Admitting: Cardiology

## 2023-01-20 ENCOUNTER — Ambulatory Visit: Payer: Medicare Other

## 2023-01-20 NOTE — Telephone Encounter (Signed)
During patients home visit with paramedicine Pts weight was noted to be increased by 5lbs in 48  hours Compliant with medications Pt does have minimal swelling and abdominal distention  It was advised for pt to take one extra 20 mg of torsemide in the PM and return to normal dose thereafter   Follow up 6/7 it was reported that pt felt much better and swelling has decreased

## 2023-01-23 ENCOUNTER — Encounter: Payer: Self-pay | Admitting: Licensed Clinical Social Worker

## 2023-01-23 ENCOUNTER — Telehealth: Payer: Self-pay | Admitting: Licensed Clinical Social Worker

## 2023-01-23 NOTE — Patient Instructions (Signed)
  I am sorry you were unable to keep your phone appointment today.   The Care Guide will contact you to reschedule the phone appointment    Ching Rabideau, LCSW Social Work Care Coordination  [phone]  

## 2023-01-23 NOTE — Patient Outreach (Signed)
  Care Coordination   01/23/2023 Name: [name] [name] MRN: [id number] DOB: Aug 22, 1945   Care Coordination Outreach Attempts:  An unsuccessful telephone outreach was attempted for a scheduled appointment today.   Referral was from [facility]  Follow Up Plan:  Additional outreach attempts will be made to offer the patient care coordination information and services.  Will route to care guide to contact patient to see if she would like to reschedule.   Encounter Outcome:  No Answer   Care Coordination Interventions:  No, not indicated    Sammuel Hines, LCSW Social Work Care Coordination  [facility]ie [employer] [phone]

## 2023-01-26 ENCOUNTER — Telehealth (HOSPITAL_COMMUNITY): Payer: Self-pay | Admitting: Emergency Medicine

## 2023-01-26 NOTE — Telephone Encounter (Signed)
[name] [name] returned my call and will call me tomorrow to give me a time.  She is getting her car worked on    [employer], EMT-Paramedic [phone] 01/26/2023

## 2023-01-26 NOTE — Telephone Encounter (Signed)
Called [name] to schedule visit.  No answer LVM.    [name] [name], EMT-Paramedic [phone] 01/26/2023

## 2023-01-27 ENCOUNTER — Telehealth (HOSPITAL_COMMUNITY): Payer: Self-pay | Admitting: Emergency Medicine

## 2023-01-27 NOTE — Telephone Encounter (Signed)
Had an appointment set up for 14:30 today.  Went by her house and her car wasn't there. She called me  @ 17:13 to advise she had been out all day and had left her cell phone at home or she would have called me sooner.  "Can we have a raincheck?"   Scheduled a home visit for 6/17 @ 4:00.    [name], EMT-Paramedic [phone] 01/27/2023

## 2023-01-30 ENCOUNTER — Other Ambulatory Visit (HOSPITAL_COMMUNITY): Payer: Self-pay | Admitting: Emergency Medicine

## 2023-01-30 NOTE — Progress Notes (Signed)
Paramedicine Encounter    Patient ID: [name] [name], female    DOB: 1946/01/08, 77 y.o.   MRN: [id number]   Complaints Headache dizziness   Assessment A&Ox 4, skin W&D w/ good color No edema noted.  Lung sounds clear bilat.  Compliance with meds She states she is compliant.  (See narrative)  Pill box filled n/a  Refills needed NONE  Meds changes since last visit NONE    Social changes Female friend who was assisting her at times is moving out to live with her daughter so [name] will be alone which she says she likes.   BP (!) 160/100 (BP Location: Right Arm, Patient Position: Standing, Cuff Size: Normal)   Pulse 64   Resp 16   Wt 174 lb (78.9 kg)   SpO2 98%   BMI 26.85 kg/m  Weight yesterday-not taken Last visit weight-175lb   Home visit with Ms. Hilgert today.  Medications reviewed and she advised she is taking them appropriately.  She did a pill box for 3 weeks and says that's long enough.  [name] admits to not taking her "Pee pill" Torsemide when she's out and about because she can't always get to a bathroom.  I encouraged her if she was going to hold the fluid pill during the day when she's running errands to make sure she takes them when she gets home.   She continues to complain of not having adequate bowel movements.  She states the stool is "green in color".  Denies any bright red blood or black looking stool.  I could hear active bowel sounds.  Her lower belly palpates firm in the lower quadrants.  She denies pain just says she "feels full".  I recommended she get in touch with her PCP and or see a GI doctor should this continue.  [name] still stands by her thoughts that the last time her ICD was adjusted it messed up her bowels.  I continue to try and educate her on this. She was hypertensive today w/ headache and some dizziness.  She had equal grip strength, no facial droop or arm drift.  No slurred speech. No visual disturbance.  I discussed with her the dangers of  high blood pressures and the possibility of having a stroke should should she not work diligently to take her meds to help get her blood pressures under control.   [name] will follow up with her tomorrow to schedule  a BP check.  ACTION: Home visit completed  [name] [phone] 01/30/23  Patient Care Team: [name], MD as PCP - General (Internal Medicine) [name], MD as PCP - Cardiology (Cardiology) [name], MD as PCP - Electrophysiology (Cardiology) Bensimhon, [name], MD as PCP - Advanced Heart Failure (Cardiology) [name] [name], MD as Consulting Physician (Cardiology) [name], LCSW as [facility] Care Management (Licensed Clinical Social Worker)  Patient Active Problem List   Diagnosis Date Noted   Dysuria 12/29/2022   Cough productive of purulent sputum 12/29/2022   Type 2 diabetes mellitus with hyperlipidemia (HCC) 12/14/2022   Acute cough 12/12/2022   Ceruminosis, bilateral 06/17/2022   Flu vaccine need 06/17/2022   Chronic combined systolic and diastolic CHF (congestive heart failure) (HCC) 06/17/2022   Encounter for general adult medical examination with abnormal findings 06/16/2022   Chronic anticoagulation 06/06/2022   Mitral regurgitation 06/06/2022   Controlled type 2 diabetes mellitus without complication, without long-term current use of insulin (  HCC) 06/06/2022   Pacemaker 02/22/2022   Hypertensive urgency 06/08/2021   Obstructive sleep apnea 09/21/2020   Obesity (BMI 30.0-34.9) 02/10/2020   COPD (chronic obstructive pulmonary disease) (HCC) 10/26/2019   Thrombocytopenia (HCC) 09/14/2017   Medication noncompliance due to cognitive impairment 09/06/2017   Persistent atrial fibrillation (HCC) 08/04/2017   Cardiomyopathy, ischemic 02/08/2017   CKD (chronic kidney disease), stage III (HCC) 01/30/2017   Coronary artery disease due to lipid rich plaque    Essential hypertension      Current Outpatient Medications:    acetaminophen (TYLENOL) 325 MG tablet, Take 2 tablets (650 mg total) by mouth every 4 (four) hours as needed for headache or mild pain., Disp: , Rfl:    allopurinol (ZYLOPRIM) 100 MG tablet, Take 100 mg by mouth daily., Disp: , Rfl:    atorvastatin (LIPITOR) 10 MG tablet, Take 1 tablet (10 mg total) by mouth at bedtime., Disp: 90 tablet, Rfl: 1   gabapentin (NEURONTIN) 100 MG capsule, Take 1 capsule (100 mg total) by mouth at bedtime., Disp: 90 capsule, Rfl: 0   hydrALAZINE (APRESOLINE) 25 MG tablet, Take 2 tablets (50 mg total) by mouth 3 (three) times daily., Disp: 180 tablet, Rfl: 1   losartan (COZAAR) 50 MG tablet, Take 0.5 tablets (25 mg total) by mouth daily., Disp: 30 tablet, Rfl: 6   metFORMIN (GLUCOPHAGE) 500 MG tablet, TAKE 1 TABLET (500 MG TOTAL) BY MOUTH DAILY WITH BREAKFAST (MEAL) (Patient taking differently: Take 500 mg by mouth daily with breakfast.), Disp: 90 tablet, Rfl: 0   Naphazoline HCl (CLEAR EYES OP), Place 1 drop into both eyes daily as needed (irritation)., Disp: , Rfl:    pantoprazole (PROTONIX) 40 MG tablet, Take 1 tablet (40 mg total) by mouth daily. Follow-up appt due in Marcht must see provider for future refills, Disp: 90 tablet, Rfl: 0   Rivaroxaban (XARELTO) 15 MG TABS tablet, Take 1 tablet (15 mg total) by mouth daily with supper., Disp: 30 tablet, Rfl: 8   spironolactone (ALDACTONE) 25 MG tablet, Take 1 tablet (25 mg total) by mouth daily., Disp: 30 tablet, Rfl: 11   torsemide (DEMADEX) 20 MG tablet, Take 1 tablet (20 mg total) by mouth daily., Disp: 90 tablet, Rfl: 3   TRELEGY ELLIPTA 100-62.5-25 MCG/ACT AEPB, Inhale ONE PUFF into THE lungs daily, Disp: 120 each, Rfl: 1   sucralfate (CARAFATE) 1 GM/10ML suspension, Take 10 mLs (1 g total) by mouth 4 (four) times daily -  with meals and at bedtime. (Patient not taking: Reported on 01/30/2023), Disp: 420 mL, Rfl: 0 Allergies  Allergen Reactions   Bee Venom Anaphylaxis   Ivp  Dye [Iodinated Contrast Media] Anaphylaxis   Ace Inhibitors     angioedema   Atenolol     severe headaches   Codeine Nausea And Vomiting   Entresto [Sacubitril-Valsartan] Other (See Comments)    Chest pain    Isosorbide     Severe headaches   Shrimp [Shellfish Allergy] Swelling   Simvastatin     not sure   Jardiance [Empagliflozin] Other (See Comments)    Caused Boils    Penicillin G Rash     Social History   Socioeconomic History   Marital status: Widowed    Spouse name: Not on file   Number of children: 1   Years of education: 49   Highest education level: Not on file  Occupational History   Occupation: RETIRED [employer]  Tobacco Use   Smoking status: Former    Packs/day:  0.50    Years: 56.00    Additional pack years: 0.00    Total pack years: 28.00    Types: Cigarettes    Quit date: 11/13/2021    Years since quitting: 1.2   Smokeless tobacco: Never   Tobacco comments:    smoking 2 cigarettes/day as of 10/23/20  Vaping Use   Vaping Use: Never used  Substance and Sexual Activity   Alcohol use: No    Alcohol/week: 0.0 standard drinks of alcohol   Drug use: No   Sexual activity: Not on file  Other Topics Concern   Not on file  Social History Narrative   Patient reports it being difficult to pay for everything due to outstanding medical bills from hospital stay last year but states she is able to keep up with basic expenses.  Patient does have some concerns with her house's condition due to a water leak- had her roof repaired last month but has some leaking in the front which has affected her porch.  Patient owns her own car and is able to drive herself but has some concerns about the reliability of her car- has gotten taxi to the clinic for appointment in the past when her car broke down.   Social Determinants of Health   Financial Resource Strain: Low Risk  (06/07/2018)   Overall Financial Resource Strain (CARDIA)    Difficulty of Paying Living  Expenses: Not hard at all  Recent Concern: Financial Resource Strain - Medium Risk (05/21/2018)   Overall Financial Resource Strain (CARDIA)    Difficulty of Paying Living Expenses: Somewhat hard  Food Insecurity: No Food Insecurity (12/15/2022)   Hunger Vital Sign    Worried About Running Out of Food in the Last Year: Never true    Ran Out of Food in the Last Year: Never true  Transportation Needs: No Transportation Needs (12/15/2022)   PRAPARE - Administrator, Civil Service (Medical): No    Lack of Transportation (Non-Medical): No  Physical Activity: Not on file  Stress: Not on file  Social Connections: Not on file  Intimate Partner Violence: Not At Risk (06/09/2022)   Humiliation, Afraid, Rape, and Kick questionnaire    Fear of Current or Ex-Partner: No    Emotionally Abused: No    Physically Abused: No    Sexually Abused: No    Physical Exam      Future Appointments  Date Time Provider Department Center  02/06/2023  1:15 PM [facility]  02/07/2023 11:00 AM Iran Ouch, MD [facility] None  03/07/2023  2:00 PM [name], MD [facility]  04/06/2023  1:00 PM [facility] ECHO OP 1 [facility]  04/06/2023  2:00 PM Bensimhon, [name], MD [facility] None  04/18/2023  7:05 AM [facility] DEVICE REMOTES [facility]  07/18/2023  7:00 AM [facility] DEVICE REMOTES [facility]  10/17/2023  7:00 AM [facility] DEVICE REMOTES [facility]

## 2023-01-31 ENCOUNTER — Telehealth: Payer: Self-pay | Admitting: *Deleted

## 2023-01-31 ENCOUNTER — Telehealth (HOSPITAL_COMMUNITY): Payer: Self-pay

## 2023-01-31 NOTE — Telephone Encounter (Signed)
Spoke to Mrs. Ercoli who reports she is feeling much better today and took her medications as prescribed and had her blood pressure checked and it was 136/76 today. She reports no further needs. I reminded her of her lab appointment next week and she confirmed. She knows to reach out to me if a need arises as [name] is out of office this week. Call complete.   [name], EMT-Paramedic [phone] 01/31/2023

## 2023-01-31 NOTE — Progress Notes (Signed)
  Care Coordination Note  01/31/2023 Name: [name] MRN: [id number] DOB: [birth year]  [name] [name] is a 77 y.o. year old female who is a primary care patient of [name], MD and is actively engaged with the care management team. I reached out to [name] by phone today to assist with re-scheduling an initial visit with the Licensed Clinical Social Worker  Follow up plan: Unsuccessful telephone outreach attempt made. A HIPAA compliant phone message was left for the patient providing contact information and requesting a return call.   Burman Nieves, CCMA Care Coordination Care Guide Direct Dial: [phone]

## 2023-02-03 ENCOUNTER — Other Ambulatory Visit (HOSPITAL_COMMUNITY): Payer: Self-pay | Admitting: Internal Medicine

## 2023-02-03 DIAGNOSIS — I5042 Chronic combined systolic (congestive) and diastolic (congestive) heart failure: Secondary | ICD-10-CM

## 2023-02-06 ENCOUNTER — Ambulatory Visit: Payer: Medicare Other | Attending: Internal Medicine

## 2023-02-06 DIAGNOSIS — Z0189 Encounter for other specified special examinations: Secondary | ICD-10-CM | POA: Diagnosis not present

## 2023-02-06 DIAGNOSIS — N183 Chronic kidney disease, stage 3 unspecified: Secondary | ICD-10-CM | POA: Diagnosis not present

## 2023-02-07 ENCOUNTER — Encounter: Payer: Self-pay | Admitting: Cardiovascular Disease

## 2023-02-07 ENCOUNTER — Ambulatory Visit: Payer: Medicare Other | Attending: Cardiovascular Disease | Admitting: Cardiovascular Disease

## 2023-02-07 VITALS — BP 138/78 | HR 50 | Ht 67.0 in | Wt 173.8 lb

## 2023-02-07 DIAGNOSIS — I739 Peripheral vascular disease, unspecified: Secondary | ICD-10-CM

## 2023-02-07 DIAGNOSIS — I4819 Other persistent atrial fibrillation: Secondary | ICD-10-CM

## 2023-02-07 DIAGNOSIS — I5022 Chronic systolic (congestive) heart failure: Secondary | ICD-10-CM

## 2023-02-07 DIAGNOSIS — E785 Hyperlipidemia, unspecified: Secondary | ICD-10-CM | POA: Diagnosis not present

## 2023-02-07 LAB — BASIC METABOLIC PANEL
BUN/Creatinine Ratio: 19 (ref 12–28)
BUN: 26 mg/dL (ref 8–27)
CO2: 20 mmol/L (ref 20–29)
Calcium: 10 mg/dL (ref 8.7–10.3)
Chloride: 106 mmol/L (ref 96–106)
Creatinine, Ser: 1.34 mg/dL — ABNORMAL HIGH (ref 0.57–1.00)
Glucose: 91 mg/dL (ref 70–99)
Potassium: 4.2 mmol/L (ref 3.5–5.2)
Sodium: 139 mmol/L (ref 134–144)
eGFR: 41 mL/min/{1.73_m2} — ABNORMAL LOW (ref >59)

## 2023-02-07 NOTE — Progress Notes (Signed)
Cardiology Office Note   Date:  02/15/2023   ID:  [name], [name] [birth year], MRN [id number]  PCP:  [name], MD  Cardiologist:  Dr. Ladona Ridgel  No chief complaint on file.     History of Present Illness: [name] [name] is a 77 y.o. female who was referred by Dr. Loreta Ave for evaluation management of peripheral arterial disease.  She has known history of atrial fibrillation and tachycardia induced cardiomyopathy.  She is status post permanent pacemaker placement and AV nodal ablation. Other medical problems include carotid disease, chronic kidney disease, COPD, diabetes mellitus, essential hypertension and hyperlipidemia. She was seen by Dr. Loreta Ave in February due to numbness in her feet.  It was felt that she has neuropathy which she was referred for arterial Doppler study. Most recent Doppler studies in March showed an ABI of 0.56 on the right and 0.51 on the left.  She was seen by Dr. Sherral Hammers in 2023 for abnormal ABI.  Her femoral pulses were not palpable and in spite of that she was asymptomatic and thus medical therapy was recommended.  She again denies claudication or lower extremity ulceration. She smokes 3 cigarettes/day.  Past Medical History:  Diagnosis Date   (HFimpEF) heart failure with improved ejection fraction (HCC)    a. 07/2018 Echo: EF 30-35%; b. 11/2019 Echo: EF 55-60%, no rwma, Gr2 DD, Nl RV size/fxn. Mild BAE. Mild MR/AI.   Arthritis    Asthma    Atopic dermatitis    CAD (coronary artery disease)    a. 2016 s/p CABG x 2 (LIMA->LAD, VG->OM); b. 08/2018 MV: EF 44%, no ischemia/infact.   Cardiac arrest (HCC) 10/2014   Cardiomyopathy, ischemic    a. 07/2018 Echo: EF 30-35%; 11/2019 Echo: EF 55-60%.   Carotid arterial disease (HCC)    a. 11/2019 Carotid U/S   CHF (congestive heart failure) (HCC)    CKD (chronic kidney disease), stage III (HCC)    COPD (chronic obstructive pulmonary disease) (HCC)    Diabetes mellitus without complication (HCC)     Dysrhythmia    Esophageal dilatation 2013   GERD (gastroesophageal reflux disease)    Glaucoma    Gout    Headache    Hypertension    Hypokalemia    Idiopathic angioedema    LGI bleed 08/06/2017   a. felt to be hemorrhoidal during that admission (no drop in Hgb).   Lower back pain    Paroxysmal atrial fibrillation (HCC)    a. Dx 2016-->h/o difficult to control rates (complicated by noncompliance), not felt to be a candidate for ablation or antiarrhythmic due to noncompliance; b. Recurrent AF 2021 - converted w/ IV dilt; c. CHA2DS2VASc = 7-->Xarelto.   Personal history of noncompliance with medical treatment, presenting hazards to health    Prediabetes    Presence of permanent cardiac pacemaker    S/P CABG x 2 with clipping of LA appendage 11/14/2014   LIMA to LAD, SVG to OM, EVH via right thigh   Urine incontinence    Uterine fibroid     Past Surgical History:  Procedure Laterality Date   AV NODE ABLATION N/A 01/18/2021   Procedure: AV NODE ABLATION;  Surgeon: [name], MD;  Location: [facility] INVASIVE CV LAB;  Service: Cardiovascular;  Laterality: N/A;   BALLOON DILATION N/A 10/10/2019   Procedure: BALLOON DILATION;  Surgeon: Kathi Der, MD;  Location: [facility] ENDOSCOPY;  Service: Gastroenterology;  Laterality: N/A;   BIOPSY  10/10/2019   Procedure: BIOPSY;  Surgeon: Kathi Der, MD;  Location: [facility] ENDOSCOPY;  Service: Gastroenterology;;   BIOPSY  10/11/2019   Procedure: BIOPSY;  Surgeon: Kathi Der, MD;  Location: [facility] ENDOSCOPY;  Service: Gastroenterology;;   Earnstine Regal N/A 11/21/2022   Procedure: BIVI  ICD UPGRADE;  Surgeon: [name], MD;  Location: [facility] INVASIVE CV LAB;  Service: Cardiovascular;  Laterality: N/A;   CARDIOVERSION N/A 11/18/2014   Procedure: CARDIOVERSION;  Surgeon: Chrystie Nose, MD;  Location: [facility] OR;  Service: Cardiovascular;  Laterality: N/A;   CARDIOVERSION N/A 12/25/2017   Procedure: CARDIOVERSION;  Surgeon: Dolores Patty, MD;   Location: [facility] ENDOSCOPY;  Service: Cardiovascular;  Laterality: N/A;   CLIPPING OF ATRIAL APPENDAGE N/A 11/14/2014   Procedure: CLIPPING OF ATRIAL APPENDAGE;  Surgeon: Purcell Nails, MD;  Location: [facility] OR;  Service: Open Heart Surgery;  Laterality: N/A;   COLONOSCOPY  2013   COLONOSCOPY WITH PROPOFOL N/A 10/11/2019   Procedure: COLONOSCOPY WITH PROPOFOL;  Surgeon: Kathi Der, MD;  Location: [facility] ENDOSCOPY;  Service: Gastroenterology;  Laterality: N/A;   CORONARY ARTERY BYPASS GRAFT N/A 11/14/2014   Procedure: CORONARY ARTERY BYPASS GRAFTING (CABG)TIMES 2 USING LEFT INTERNAL MAMMARY ARTERY AND RIGHT SAPHENOUS VEIN HARVESTED ENDOSCOPICALLY;  Surgeon: Purcell Nails, MD;  Location: [facility] OR;  Service: Open Heart Surgery;  Laterality: N/A;   ESOPHAGOGASTRODUODENOSCOPY (EGD) WITH PROPOFOL N/A 10/10/2019   Procedure: ESOPHAGOGASTRODUODENOSCOPY (EGD) WITH PROPOFOL;  Surgeon: Kathi Der, MD;  Location: [facility] ENDOSCOPY;  Service: Gastroenterology;  Laterality: N/A;   ESOPHAGOGASTRODUODENOSCOPY (EGD) WITH PROPOFOL N/A 10/27/2019   Procedure: ESOPHAGOGASTRODUODENOSCOPY (EGD) WITH PROPOFOL;  Surgeon: Kerin Salen, MD;  Location: [name] ENDOSCOPY;  Service: Gastroenterology;  Laterality: N/A;   GIVENS CAPSULE STUDY N/A 10/27/2019   Procedure: GIVENS CAPSULE STUDY;  Surgeon: Kerin Salen, MD;  Location: [facility] ENDOSCOPY;  Service: Gastroenterology;  Laterality: N/A;   INSERT / REPLACE / REMOVE PACEMAKER     LEFT HEART CATHETERIZATION WITH CORONARY ANGIOGRAM N/A 11/04/2014   Procedure: LEFT HEART CATHETERIZATION WITH CORONARY ANGIOGRAM;  Surgeon: Lennette Bihari, MD;  Location: [facility] CATH LAB;  Service: Cardiovascular;  Laterality: N/A;   PACEMAKER IMPLANT N/A 01/18/2021   Procedure: PACEMAKER IMPLANT;  Surgeon: [name], MD;  Location: [facility] INVASIVE CV LAB;  Service: Cardiovascular;  Laterality: N/A;   PACEMAKER IMPLANT Left    POLYPECTOMY  10/11/2019   Procedure: POLYPECTOMY;  Surgeon: Kathi Der, MD;   Location: [facility] ENDOSCOPY;  Service: Gastroenterology;;   RIGHT HEART CATH N/A 06/08/2022   Procedure: RIGHT HEART CATH;  Surgeon: Dolores Patty, MD;  Location: [facility] INVASIVE CV LAB;  Service: Cardiovascular;  Laterality: N/A;   TEE WITHOUT CARDIOVERSION N/A 11/14/2014   Procedure: TRANSESOPHAGEAL ECHOCARDIOGRAM (TEE);  Surgeon: Purcell Nails, MD;  Location: [facility] OR;  Service: Open Heart Surgery;  Laterality: N/A;   TEE WITHOUT CARDIOVERSION N/A 12/25/2017   Procedure: TRANSESOPHAGEAL ECHOCARDIOGRAM (TEE);  Surgeon: Dolores Patty, MD;  Location: [facility] ENDOSCOPY;  Service: Cardiovascular;  Laterality: N/A;   TEMPORARY PACEMAKER INSERTION  11/04/2014   Procedure: TEMPORARY PACEMAKER INSERTION;  Surgeon: Lennette Bihari, MD;  Location: [facility] CATH LAB;  Service: Cardiovascular;;   TOOTH EXTRACTION       Current Outpatient Medications  Medication Sig Dispense Refill   acetaminophen (TYLENOL) 325 MG tablet Take 2 tablets (650 mg total) by mouth every 4 (four) hours as needed for headache or mild pain.     allopurinol (ZYLOPRIM) 100 MG tablet Take 100 mg by mouth daily.     atorvastatin (LIPITOR)  10 MG tablet Take 1 tablet (10 mg total) by mouth at bedtime. 90 tablet 1   gabapentin (NEURONTIN) 100 MG capsule Take 1 capsule (100 mg total) by mouth at bedtime. 90 capsule 0   hydrALAZINE (APRESOLINE) 25 MG tablet TAKE 2 TABLETS (50 MG TOTAL) BY MOUTH 3 (THREE) TIMES DAILY. 270 tablet 3   losartan (COZAAR) 50 MG tablet Take 0.5 tablets (25 mg total) by mouth daily. 30 tablet 6   metFORMIN (GLUCOPHAGE) 500 MG tablet TAKE 1 TABLET (500 MG TOTAL) BY MOUTH DAILY WITH BREAKFAST (MEAL) (Patient taking differently: Take 500 mg by mouth daily with breakfast.) 90 tablet 0   metoprolol succinate (TOPROL-XL) 100 MG 24 hr tablet Take 100 mg by mouth daily.     Naphazoline HCl (CLEAR EYES OP) Place 1 drop into both eyes daily as needed (irritation).     ONETOUCH ULTRA test strip 2 (two) times daily. as directed      pantoprazole (PROTONIX) 40 MG tablet Take 1 tablet (40 mg total) by mouth daily. Follow-up appt due in Marcht must see provider for future refills 90 tablet 0   Rivaroxaban (XARELTO) 15 MG TABS tablet Take 1 tablet (15 mg total) by mouth daily with supper. 30 tablet 8   spironolactone (ALDACTONE) 25 MG tablet Take 1 tablet (25 mg total) by mouth daily. 30 tablet 11   sucralfate (CARAFATE) 1 GM/10ML suspension Take 10 mLs (1 g total) by mouth 4 (four) times daily -  with meals and at bedtime. 420 mL 0   torsemide (DEMADEX) 20 MG tablet Take 1 tablet (20 mg total) by mouth daily. 90 tablet 3   TRELEGY ELLIPTA 100-62.5-25 MCG/ACT AEPB Inhale ONE PUFF into THE lungs daily 120 each 1   No current facility-administered medications for this visit.    Allergies:   Bee venom, Ivp dye [iodinated contrast media], Ace inhibitors, Atenolol, Codeine, Entresto [sacubitril-valsartan], Isosorbide, Shrimp [shellfish allergy], Simvastatin, Jardiance [empagliflozin], and Penicillin g    Social History:  The patient  reports that she quit smoking about 15 months ago. Her smoking use included cigarettes. She has a 28.00 pack-year smoking history. She has never used smokeless tobacco. She reports that she does not drink alcohol and does not use drugs.   Family History:  [name] family history includes CVA in her sister; Cancer in her mother; Diabetes in her brother; Heart disease in her father; Other in her father; Prostate cancer (age of onset: 108) in her brother.    ROS:  Please see the history of present illness.   Otherwise, review of systems are positive for none.   All other systems are reviewed and negative.    PHYSICAL EXAM: VS:  BP 138/78 (BP Location: Left Arm, Patient Position: Sitting, Cuff Size: Normal)   Pulse (!) 50   Ht 5\' 7"  (1.702 m)   Wt 173 lb 12.8 oz (78.8 kg)   SpO2 100%   BMI 27.22 kg/m  , BMI Body mass index is 27.22 kg/m. GEN: Well nourished, well developed, in no acute distress   HEENT: normal  Neck: no JVD, carotid bruits, or masses Cardiac: RRR; no murmurs, rubs, or gallops,no edema  Respiratory:  clear to auscultation bilaterally, normal work of breathing GI: soft, nontender, nondistended, + BS MS: no deformity or atrophy  Skin: warm and dry, no rash Neuro:  Strength and sensation are intact Psych: euthymic mood, full affect Vascular: Femoral pulses and distal pulses are not palpable.  EKG:  EKG is not ordered  today.    Recent Labs: 03/16/2022: TSH 1.42 06/22/2022: Magnesium 1.9 12/12/2022: Pro B Natriuretic peptide (BNP) 764.0 12/23/2022: B Natriuretic Peptide 135.6 01/11/2023: ALT 12; Hemoglobin 15.6; Platelets 133 02/06/2023: BUN 26; Creatinine, Ser 1.34; Potassium 4.2; Sodium 139    Lipid Panel    Component Value Date/Time   CHOL 109 03/16/2022 1343   TRIG 123.0 03/16/2022 1343   HDL 30.20 (L) 03/16/2022 1343   CHOLHDL 4 03/16/2022 1343   VLDL 24.6 03/16/2022 1343   LDLCALC 54 03/16/2022 1343      Wt Readings from Last 3 Encounters:  02/07/23 173 lb 12.8 oz (78.8 kg)  01/30/23 174 lb (78.9 kg)  01/19/23 175 lb (79.4 kg)           No data to display            ASSESSMENT AND PLAN:  1.  Peripheral arterial disease: Moderately reduced ABI and likely has significant aortoiliac disease.  In spite of that, she appears to be asymptomatic.  Thus, there is no indication for angiography or revascularization.  Recommend treatment of risk factors and I asked her to stay as active as she can.  She is on Xarelto for atrial fibrillation.  2.  Chronic systolic heart failure: She appears to be euvolemic.  Continue medical therapy.  3.  Atrial fibrillation: On long-term anticoagulation with Xarelto.  4.  Tobacco use: I discussed with her the importance of smoking cessation.  5.  Hyperlipidemia: Recommend a target LDL of less than 70.  Most recent lipid profile showed an LDL of 54.  Continue atorvastatin.     Disposition:   FU with me in 6  months  Signed,  Lorine Bears, MD  02/15/2023 1:14 PM    [facility]

## 2023-02-07 NOTE — Patient Instructions (Signed)
Medication Instructions:  No changes *If you need a refill on your cardiac medications before your next appointment, please call your pharmacy*   Lab Work: None ordered If you have labs (blood work) drawn today and your tests are completely normal, you will receive your results only by: MyChart Message (if you have MyChart) OR A paper copy in the mail If you have any lab test that is abnormal or we need to change your treatment, we will call you to review the results.   Testing/Procedures: None ordered   Follow-Up: At [facility], you and your health needs are our priority.  As part of our continuing mission to provide you with exceptional heart care, we have created designated Provider Care Teams.  These Care Teams include your primary Cardiologist (physician) and Advanced Practice Providers (APPs -  Physician Assistants and Nurse Practitioners) who all work together to provide you with the care you need, when you need it.  We recommend signing up for the patient portal called "MyChart".  Sign up information is provided on this After Visit Summary.  MyChart is used to connect with patients for Virtual Visits (Telemedicine).  Patients are able to view lab/test results, encounter notes, upcoming appointments, etc.  Non-urgent messages can be sent to your provider as well.   To learn more about what you can do with MyChart, go to [email or web address].    Your next appointment:   6 month(s)  Provider:   Dr. Kirke Corin Other Instructions Managing the Challenge of Quitting Smoking Quitting smoking is a physical and mental challenge. You may have cravings, withdrawal symptoms, and temptation to smoke. Before quitting, work with your health care provider to make a plan that can help you manage quitting. Making a plan before you quit may keep you from smoking when you have the urge to smoke while trying to quit. How to manage lifestyle changes Managing stress Stress can make you want  to smoke, and wanting to smoke may cause stress. It is important to find ways to manage your stress. You could try some of the following: Practice relaxation techniques. Breathe slowly and deeply, in through your nose and out through your mouth. Listen to music. Soak in a bath or take a shower. Imagine a peaceful place or vacation. Get some support. Talk with family or friends about your stress. Join a support group. Talk with a counselor or therapist. Get some physical activity. Go for a walk, run, or bike ride. Play a favorite sport. Practice yoga.  Medicines Talk with your health care provider about medicines that might help you deal with cravings and make quitting easier for you. Relationships Social situations can be difficult when you are quitting smoking. To manage this, you can: Avoid parties and other social situations where people might be smoking. Avoid alcohol. Leave right away if you have the urge to smoke. Explain to your family and friends that you are quitting smoking. Ask for support and let them know you might be a bit grumpy. Plan activities where smoking is not an option. General instructions Be aware that many people gain weight after they quit smoking. However, not everyone does. To keep from gaining weight, have a plan in place before you quit, and stick to the plan after you quit. Your plan should include: Eating healthy snacks. When you have a craving, it may help to: Eat popcorn, or try carrots, celery, or other cut vegetables. Chew sugar-free gum. Changing how you eat. Eat small portion sizes at  meals. Eat 4-6 small meals throughout the day instead of 1-2 large meals a day. Be mindful when you eat. You should avoid watching television or doing other things that might distract you as you eat. Exercising regularly. Make time to exercise each day. If you do not have time for a long workout, do short bouts of exercise for 5-10 minutes several times a day. Do  some form of strengthening exercise, such as weight lifting. Do some exercise that gets your heart beating and causes you to breathe deeply, such as walking fast, running, swimming, or biking. This is very important. Drinking plenty of water or other low-calorie or no-calorie drinks. Drink enough fluid to keep your urine pale yellow.  How to recognize withdrawal symptoms Your body and mind may experience discomfort as you try to get used to not having nicotine in your system. These effects are called withdrawal symptoms. They may include: Feeling hungrier than normal. Having trouble concentrating. Feeling irritable or restless. Having trouble sleeping. Feeling depressed. Craving a cigarette. These symptoms may surprise you, but they are normal to have when quitting smoking. To manage withdrawal symptoms: Avoid places, people, and activities that trigger your cravings. Remember why you want to quit. Get plenty of sleep. Avoid coffee and other drinks that contain caffeine. These may worsen some of your symptoms. How to manage cravings Come up with a plan for how to deal with your cravings. The plan should include the following: A definition of the specific situation you want to deal with. An activity or action you will take to replace smoking. A clear idea for how this action will help. The name of someone who could help you with this. Cravings usually last for 5-10 minutes. Consider taking the following actions to help you with your plan to deal with cravings: Keep your mouth busy. Chew sugar-free gum. Suck on hard candies or a straw. Brush your teeth. Keep your hands and body busy. Change to a different activity right away. Squeeze or play with a ball. Do an activity or a hobby, such as making bead jewelry, practicing needlepoint, or working with wood. Mix up your normal routine. Take a short exercise break. Go for a quick walk, or run up and down stairs. Focus on doing something  kind or helpful for someone else. Call a friend or family member to talk during a craving. Join a support group. Contact a quitline. Where to find support To get help or find a support group: Call the National Cancer Institute's Smoking Quitline: 1-800-QUIT-NOW 403-171-1105) Text QUIT to SmokefreeTXT: 454098 Where to find more information Visit these websites to find more information on quitting smoking: U.S. Department of Health and Human Services: www.smokefree.gov American Lung Association: www.freedomfromsmoking.org Centers for Disease Control and Prevention (CDC): FootballExhibition.com.br American Heart Association: www.heart.org Contact a health care provider if: You want to change your plan for quitting. The medicines you are taking are not helping. Your eating feels out of control or you cannot sleep. You feel depressed or become very anxious. Summary Quitting smoking is a physical and mental challenge. You will face cravings, withdrawal symptoms, and temptation to smoke again. Preparation can help you as you go through these challenges. Try different techniques to manage stress, handle social situations, and prevent weight gain. You can deal with cravings by keeping your mouth busy (such as by chewing gum), keeping your hands and body busy, calling family or friends, or contacting a quitline for people who want to quit smoking. You can deal  with withdrawal symptoms by avoiding places where people smoke, getting plenty of rest, and avoiding drinks that contain caffeine. This information is not intended to replace advice given to you by your health care provider. Make sure you discuss any questions you have with your health care provider. Document Revised: 07/23/2021 Document Reviewed: 07/23/2021 Elsevier Patient Education  2024 [employer].

## 2023-02-09 NOTE — Progress Notes (Signed)
Remote pacemaker transmission.   

## 2023-02-14 NOTE — Progress Notes (Signed)
  Care Coordination Note  02/14/2023 Name: HUONG BUTTRY MRN: [id number] DOB: 1946/02/15  LERIN SCHURZ is a 77 y.o. year old female who is a primary care patient of [name], MD and is actively engaged with the care management team. I reached out to [name] by phone today to assist with re-scheduling an initial visit with the Licensed Clinical Social Worker  Follow up plan: Telephone appointment with care management team member scheduled for: 02/21/2023  Burman Nieves, [facility] Coordination Care Guide Direct Dial: [phone]

## 2023-02-15 ENCOUNTER — Telehealth (HOSPITAL_COMMUNITY): Payer: Self-pay | Admitting: Emergency Medicine

## 2023-02-15 NOTE — Telephone Encounter (Signed)
Contacted [name] [name] to remind her of today's appointment and she advised she wanted to reschedule until Friday at 12:00.    [name], EMT-Paramedic [phone] 02/15/2023

## 2023-02-17 ENCOUNTER — Other Ambulatory Visit (HOSPITAL_COMMUNITY): Payer: Self-pay | Admitting: Emergency Medicine

## 2023-02-17 NOTE — Progress Notes (Signed)
Paramedicine Encounter    Patient ID: [name] [name], female    DOB: 15-Oct-1945, 77 y.o.   MRN: [id number]   Complaints NONE  Assessment A&O x 4, skn W&D w/ good color.  Lung sounds clear and equal bilat No edema noted.  Compliance with meds 100%  Pill box filled Takes meds from bottles  Refills needed  NONE  Meds changes since last visit NONE    Social changes NONE   BP (!) 140/90 (BP Location: Right Arm, Patient Position: Sitting, Cuff Size: Normal)   Pulse 74   Resp 16   Wt 172 lb (78 kg)   SpO2 96%   BMI 26.94 kg/m  Weight yesterday-not taken Last visit weight-174lb  ACTION: Home visit completed  [name] [phone] 02/17/23  Patient Care Team: [name], MD as PCP - General (Internal Medicine) [name], MD as PCP - Cardiology (Cardiology) [name], MD as PCP - Electrophysiology (Cardiology) Bensimhon, [name], MD as PCP - Advanced Heart Failure (Cardiology) [name] [name], MD as Consulting Physician (Cardiology) [name], LCSW as [facility] Care Management (Licensed Clinical Social Worker)  Patient Active Problem List   Diagnosis Date Noted  . Dysuria 12/29/2022  . Cough productive of purulent sputum 12/29/2022  . Type 2 diabetes mellitus with hyperlipidemia (HCC) 12/14/2022  . Acute cough 12/12/2022  . Ceruminosis, bilateral 06/17/2022  . Flu vaccine need 06/17/2022  . Chronic combined systolic and diastolic CHF (congestive heart failure) (HCC) 06/17/2022  . Encounter for general adult medical examination with abnormal findings 06/16/2022  . Chronic anticoagulation 06/06/2022  . Mitral regurgitation 06/06/2022  . Controlled type 2 diabetes mellitus without complication, without long-term current use of insulin (HCC) 06/06/2022  . Pacemaker 02/22/2022  . Hypertensive urgency 06/08/2021  . Obstructive sleep apnea 09/21/2020  . Obesity (BMI 30.0-34.9) 02/10/2020  . COPD (chronic obstructive  pulmonary disease) (HCC) 10/26/2019  . Thrombocytopenia (HCC) 09/14/2017  . Medication noncompliance due to cognitive impairment 09/06/2017  . Persistent atrial fibrillation (HCC) 08/04/2017  . Cardiomyopathy, ischemic 02/08/2017  . CKD (chronic kidney disease), stage III (HCC) 01/30/2017  . Coronary artery disease due to lipid rich plaque   . Essential hypertension     Current Outpatient Medications:  .  acetaminophen (TYLENOL) 325 MG tablet, Take 2 tablets (650 mg total) by mouth every 4 (four) hours as needed for headache or mild pain., Disp: , Rfl:  .  allopurinol (ZYLOPRIM) 100 MG tablet, Take 100 mg by mouth daily., Disp: , Rfl:  .  atorvastatin (LIPITOR) 10 MG tablet, Take 1 tablet (10 mg total) by mouth at bedtime., Disp: 90 tablet, Rfl: 1 .  gabapentin (NEURONTIN) 100 MG capsule, Take 1 capsule (100 mg total) by mouth at bedtime., Disp: 90 capsule, Rfl: 0 .  hydrALAZINE (APRESOLINE) 25 MG tablet, TAKE 2 TABLETS (50 MG TOTAL) BY MOUTH 3 (THREE) TIMES DAILY., Disp: 270 tablet, Rfl: 3 .  losartan (COZAAR) 50 MG tablet, Take 0.5 tablets (25 mg total) by mouth daily., Disp: 30 tablet, Rfl: 6 .  metFORMIN (GLUCOPHAGE) 500 MG tablet, TAKE 1 TABLET (500 MG TOTAL) BY MOUTH DAILY WITH BREAKFAST (MEAL) (Patient taking differently: Take 500 mg by mouth daily with breakfast.), Disp: 90 tablet, Rfl: 0 .  metoprolol succinate (TOPROL-XL) 100 MG 24 hr tablet, Take 100 mg by mouth daily., Disp: , Rfl:  .  Naphazoline HCl (CLEAR EYES OP), Place 1 drop into both eyes daily as needed (irritation)., Disp: , Rfl:  .  ONETOUCH ULTRA test strip, 2 (two) times daily. as directed, Disp: , Rfl:  .  pantoprazole (PROTONIX) 40 MG tablet, Take 1 tablet (40 mg total) by mouth daily. Follow-up appt due in Marcht must see provider for future refills, Disp: 90 tablet, Rfl: 0 .  Rivaroxaban (XARELTO) 15 MG TABS tablet, Take 1 tablet (15 mg total) by mouth daily with supper., Disp: 30 tablet, Rfl: 8 .  spironolactone  (ALDACTONE) 25 MG tablet, Take 1 tablet (25 mg total) by mouth daily., Disp: 30 tablet, Rfl: 11 .  torsemide (DEMADEX) 20 MG tablet, Take 1 tablet (20 mg total) by mouth daily., Disp: 90 tablet, Rfl: 3 .  TRELEGY ELLIPTA 100-62.5-25 MCG/ACT AEPB, Inhale ONE PUFF into THE lungs daily, Disp: 120 each, Rfl: 1 .  sucralfate (CARAFATE) 1 GM/10ML suspension, Take 10 mLs (1 g total) by mouth 4 (four) times daily -  with meals and at bedtime. (Patient not taking: Reported on 02/17/2023), Disp: 420 mL, Rfl: 0 Allergies  Allergen Reactions  . Bee Venom Anaphylaxis  . Ivp Dye [Iodinated Contrast Media] Anaphylaxis  . Ace Inhibitors     angioedema  . Atenolol     severe headaches  . Codeine Nausea And Vomiting  . Entresto [Sacubitril-Valsartan] Other (See Comments)    Chest pain   . Isosorbide     Severe headaches  . Shrimp [Shellfish Allergy] Swelling  . Simvastatin     not sure  . Jardiance [Empagliflozin] Other (See Comments)    Caused Boils   . Penicillin G Rash     Social History   Socioeconomic History  . Marital status: Widowed    Spouse name: Not on file  . Number of children: 1  . Years of education: 20  . Highest education level: Not on file  Occupational History  . Occupation: RETIRED [employer]  Tobacco Use  . Smoking status: Former    Packs/day: 0.50    Years: 56.00    Additional pack years: 0.00    Total pack years: 28.00    Types: Cigarettes    Quit date: 11/13/2021    Years since quitting: 1.2  . Smokeless tobacco: Never  . Tobacco comments:    smoking 2 cigarettes/day as of 10/23/20  Vaping Use  . Vaping Use: Never used  Substance and Sexual Activity  . Alcohol use: No    Alcohol/week: 0.0 standard drinks of alcohol  . Drug use: No  . Sexual activity: Not on file  Other Topics Concern  . Not on file  Social History Narrative   Patient reports it being difficult to pay for everything due to outstanding medical bills from hospital stay last year but  states she is able to keep up with basic expenses.  Patient does have some concerns with her house's condition due to a water leak- had her roof repaired last month but has some leaking in the front which has affected her porch.  Patient owns her own car and is able to drive herself but has some concerns about the reliability of her car- has gotten taxi to the clinic for appointment in the past when her car broke down.   Social Determinants of Health   Financial Resource Strain: Low Risk  (06/07/2018)   Overall Financial Resource Strain (CARDIA)   . Difficulty of Paying Living Expenses: Not hard at all  Recent Concern: Financial Resource Strain - Medium Risk (05/21/2018)   Overall Financial Resource Strain (CARDIA)   . Difficulty of Paying  Living Expenses: Somewhat hard  Food Insecurity: No Food Insecurity (12/15/2022)   Hunger Vital Sign   . Worried About Programme researcher, broadcasting/film/video in the Last Year: Never true   . Ran Out of Food in the Last Year: Never true  Transportation Needs: No Transportation Needs (12/15/2022)   PRAPARE - Transportation   . Lack of Transportation (Medical): No   . Lack of Transportation (Non-Medical): No  Physical Activity: Not on file  Stress: Not on file  Social Connections: Not on file  Intimate Partner Violence: Not At Risk (06/09/2022)   Humiliation, Afraid, Rape, and Kick questionnaire   . Fear of Current or Ex-Partner: No   . Emotionally Abused: No   . Physically Abused: No   . Sexually Abused: No    Physical Exam      Future Appointments  Date Time Provider Department Center  02/21/2023 11:00 AM [name], LCSW [facility] None  03/07/2023  2:00 PM [name], MD [facility]  04/06/2023  1:00 PM [facility] ECHO OP 1 [facility]  04/06/2023  2:00 PM Bensimhon, [name], MD [facility] None  04/18/2023  7:05 AM [facility] DEVICE REMOTES [facility]  07/18/2023  7:00 AM [facility] DEVICE REMOTES [facility]  10/17/2023  7:00  AM [facility] DEVICE REMOTES [facility]

## 2023-02-18 DIAGNOSIS — R0602 Shortness of breath: Secondary | ICD-10-CM | POA: Diagnosis not present

## 2023-02-18 DIAGNOSIS — J40 Bronchitis, not specified as acute or chronic: Secondary | ICD-10-CM | POA: Diagnosis not present

## 2023-02-21 ENCOUNTER — Encounter: Payer: Self-pay | Admitting: Licensed Clinical Social Worker

## 2023-02-21 ENCOUNTER — Telehealth: Payer: Self-pay | Admitting: Licensed Clinical Social Worker

## 2023-02-21 NOTE — Patient Outreach (Signed)
  Care Coordination   02/21/2023 Name: [name] [name] MRN: [id number] DOB: 03/02/1946   Care Coordination Outreach Attempts:  A second unsuccessful outreach was attempted today to offer the patient with information about available care coordination services.  Follow Up Plan:  Additional outreach attempts will be made to offer the patient care coordination information and services. Sent chart to care guide to see if patient would like to reschedule phone appointment.   Encounter Outcome:  No Answer   Care Coordination Interventions:  No, not indicated    Sammuel Hines, LCSW Social Work Care Coordination  [facility] [name] [name] [employer] [phone]

## 2023-02-21 NOTE — Patient Instructions (Signed)
  I am sorry you were unable to keep your phone appointment today.   Please call me to reschedule  Alexiss Iturralde, LCSW Social Work Care Coordination  [phone]  

## 2023-02-22 ENCOUNTER — Ambulatory Visit: Payer: Self-pay | Admitting: Licensed Clinical Social Worker

## 2023-02-22 NOTE — Patient Instructions (Signed)
Social Work Visit Information  Thank you for taking time to visit with me today. Please don't hesitate to contact me if I can be of assistance to you.   Following are the goals we discussed today:   Goals Addressed             This Visit's Progress    COMPLETED: Care Coordination No follow up Required       Activities and task to complete in order to accomplish goals.   Keep all upcoming appointment discussed today Continue with compliance of taking medication prescribed by Doctor Continue to work with [facility] Paramedicine program with heart failure clinic        Patient does not desire continued follow-up by social work. They will contact the office if needed  Please call the care guide team at [phone] if you need to cancel or reschedule your appointment.   If you or anyone you know are experiencing a Mental Health or Behavioral Health Crisis or need someone to talk to, please call the Suicide and Crisis Lifeline: 988 call the [place] National Suicide Prevention Lifeline: (512)768-3008 or TTY: 907-584-4730 TTY (226)704-0925) to talk to a trained counselor call 1-800-273-TALK (toll free, 24 hour hotline) go to [facility] [address] [place] [phone])   The patient verbalized understanding of instructions, educational materials, and care plan provided today and DECLINED offer to receive copy of patient instructions, educational materials, and care plan.     Sammuel Hines, LCSW Social Work Care Coordination  [facility] Emmie Niemann [employer] [phone]

## 2023-02-22 NOTE — Patient Outreach (Signed)
  Care Coordination  Initial Visit Note   02/22/2023 Name: [name] [name] MRN: [id number] DOB: 1946/02/26  [name] [name] is a 77 y.o. year old female who sees [name], MD for primary care. I spoke with  [name] [name] by phone today.  What matters to the patients health and wellness today?   Patient reports no concerns or needs from Care Coordination team with health and wellness related to physical or mental heath. .    Goals Addressed             This Visit's Progress    COMPLETED: Care Coordination No follow up Required       Activities and task to complete in order to accomplish goals.   Keep all upcoming appointment discussed today Continue with compliance of taking medication prescribed by Doctor Continue to work with [facility] Paramedicine program with heart failure clinic       SDOH assessments and interventions completed:  Yes  SDOH Interventions Today    Flowsheet Row Most Recent Value  SDOH Interventions   Stress Interventions Intervention Not Indicated       Care Coordination Interventions:  Yes, provided  Interventions Today    Flowsheet Row Most Recent Value  Chronic Disease   Chronic disease during today's visit Hypertension (HTN), Chronic Obstructive Pulmonary Disease (COPD), Diabetes, Congestive Heart Failure (CHF)  General Interventions   General Interventions Discussed/Reviewed General Interventions Discussed  [Reviewed Care Coordination progam and assesed for needs]  Education Interventions   Education Provided Provided Education  Provided Verbal Education On Community Resources  [how to access Mychart]  Mental Health Interventions   Mental Health Discussed/Reviewed Mental Health Discussed       Follow up plan: No further intervention required.   Encounter Outcome:  Pt. Visit Completed   Sammuel Hines, LCSW Social Work Care Coordination  [facility]ie [facility]iemann [employer] [phone]

## 2023-03-03 ENCOUNTER — Other Ambulatory Visit: Payer: Self-pay | Admitting: Internal Medicine

## 2023-03-03 DIAGNOSIS — E785 Hyperlipidemia, unspecified: Secondary | ICD-10-CM

## 2023-03-07 ENCOUNTER — Ambulatory Visit: Payer: Medicare Other | Admitting: Internal Medicine

## 2023-03-08 ENCOUNTER — Other Ambulatory Visit (HOSPITAL_COMMUNITY): Payer: Self-pay | Admitting: Emergency Medicine

## 2023-03-08 NOTE — Progress Notes (Unsigned)
Paramedicine Encounter    Patient ID: [name] [name], female    DOB: 09-15-1945, 77 y.o.   MRN: [id number]   Complaints NONE  Assessment A&O x 4, skin W&D w/ good color. Lung sounds clear throughout and no edema noted.  Compliance with meds Pt. Insists on taking meds from bottles.  Only compliance I can say is what she tells me.   Pill box filled N/A  Refills needed NONE  Meds changes since last visit NONE    Social changes NONE  CBG 101  BP (!) 160/90 (BP Location: Right Arm, Patient Position: Sitting, Cuff Size: Normal)   Pulse 74   Resp 14   Wt 174 lb 3.2 oz (79 kg)   SpO2 98%   BMI 27.28 kg/m  Weight yesterday- not taken Last visit weight-172lb  Pt continues to refuse to allow me to fill her pill box for her to measure med compliance.  She allowed me to for a few weeks but she tells me that she would rather do it herself. She tells me today she is not taking her Losartan because she says it makes her back hurt.  I attempted to explain to her that it was probably not the Losartan that was making her back hurt but she advises a this time she doesn't wish to take it.   Today [name]'s BP is high.  She denies headache or dizziness.  No obvious neurological deficits.  Based on our conversations she is making poor nutritional choices and is using MSG as a salt substitute. I advised her that this is equally as bad as salt and she should consider other options such as garlic powder, onion powder, etc.  I encouraged her to read labels and make sure she doesn't see "sodium" or "potassium chloride" on the labels.     ACTION: Home visit completed  [name] [phone] 03/10/23  Patient Care Team: [name], MD as PCP - General (Internal Medicine) [name], MD as PCP - Cardiology (Cardiology) [name], MD as PCP - Electrophysiology (Cardiology) Bensimhon, [name], MD as PCP - Advanced Heart Failure (Cardiology) Chrystie Nose, MD as  Consulting Physician (Cardiology)  Patient Active Problem List   Diagnosis Date Noted   Dysuria 12/29/2022   Cough productive of purulent sputum 12/29/2022   Type 2 diabetes mellitus with hyperlipidemia (HCC) 12/14/2022   Acute cough 12/12/2022   Ceruminosis, bilateral 06/17/2022   Flu vaccine need 06/17/2022   Chronic combined systolic and diastolic CHF (congestive heart failure) (HCC) 06/17/2022   Encounter for general adult medical examination with abnormal findings 06/16/2022   Chronic anticoagulation 06/06/2022   Mitral regurgitation 06/06/2022   Controlled type 2 diabetes mellitus without complication, without long-term current use of insulin (HCC) 06/06/2022   Pacemaker 02/22/2022   Hypertensive urgency 06/08/2021   Obstructive sleep apnea 09/21/2020   Obesity (BMI 30.0-34.9) 02/10/2020   COPD (chronic obstructive pulmonary disease) (HCC) 10/26/2019   Thrombocytopenia (HCC) 09/14/2017   Medication noncompliance due to cognitive impairment 09/06/2017   Persistent atrial fibrillation (HCC) 08/04/2017   Cardiomyopathy, ischemic 02/08/2017   CKD (chronic kidney disease), stage III (HCC) 01/30/2017   Coronary artery disease due to lipid rich plaque    Essential hypertension     Current Outpatient Medications:    acetaminophen (TYLENOL) 325 MG tablet, Take 2 tablets (650 mg total) by mouth every 4 (four) hours as needed for headache or mild pain., Disp: , Rfl:    allopurinol (ZYLOPRIM)  100 MG tablet, Take 100 mg by mouth daily., Disp: , Rfl:    atorvastatin (LIPITOR) 10 MG tablet, TAKE 1 TABLET BY MOUTH EVERYDAY AT BEDTIME, Disp: 90 tablet, Rfl: 0   gabapentin (NEURONTIN) 100 MG capsule, Take 1 capsule (100 mg total) by mouth at bedtime., Disp: 90 capsule, Rfl: 0   hydrALAZINE (APRESOLINE) 25 MG tablet, TAKE 2 TABLETS (50 MG TOTAL) BY MOUTH 3 (THREE) TIMES DAILY., Disp: 270 tablet, Rfl: 3   metFORMIN (GLUCOPHAGE) 500 MG tablet, Take 1 tablet (500 mg total) by mouth daily with  breakfast., Disp: 90 tablet, Rfl: 0   Naphazoline HCl (CLEAR EYES OP), Place 1 drop into both eyes daily as needed (irritation)., Disp: , Rfl:    ONETOUCH ULTRA test strip, 2 (two) times daily. as directed, Disp: , Rfl:    pantoprazole (PROTONIX) 40 MG tablet, TAKE 1 TABLET BY MOUTH EVERY DAY, Disp: 90 tablet, Rfl: 0   Rivaroxaban (XARELTO) 15 MG TABS tablet, Take 1 tablet (15 mg total) by mouth daily with supper., Disp: 30 tablet, Rfl: 8   spironolactone (ALDACTONE) 25 MG tablet, Take 1 tablet (25 mg total) by mouth daily., Disp: 30 tablet, Rfl: 11   torsemide (DEMADEX) 20 MG tablet, Take 1 tablet (20 mg total) by mouth daily., Disp: 90 tablet, Rfl: 3   TRELEGY ELLIPTA 100-62.5-25 MCG/ACT AEPB, Inhale ONE PUFF into THE lungs daily, Disp: 120 each, Rfl: 1   losartan (COZAAR) 50 MG tablet, Take 0.5 tablets (25 mg total) by mouth daily. (Patient not taking: Reported on 03/08/2023), Disp: 30 tablet, Rfl: 6   metoprolol succinate (TOPROL-XL) 100 MG 24 hr tablet, Take 100 mg by mouth daily. (Patient not taking: Reported on 03/08/2023), Disp: , Rfl:    sucralfate (CARAFATE) 1 GM/10ML suspension, Take 10 mLs (1 g total) by mouth 4 (four) times daily -  with meals and at bedtime. (Patient not taking: Reported on 02/17/2023), Disp: 420 mL, Rfl: 0 Allergies  Allergen Reactions   Bee Venom Anaphylaxis   Ivp Dye [Iodinated Contrast Media] Anaphylaxis   Ace Inhibitors     angioedema   Atenolol     severe headaches   Codeine Nausea And Vomiting   Entresto [Sacubitril-Valsartan] Other (See Comments)    Chest pain    Isosorbide     Severe headaches   Shrimp [Shellfish Allergy] Swelling   Simvastatin     not sure   Jardiance [Empagliflozin] Other (See Comments)    Caused Boils    Penicillin G Rash     Social History   Socioeconomic History   Marital status: Widowed    Spouse name: Not on file   Number of children: 1   Years of education: 9   Highest education level: Not on file  Occupational  History   Occupation: RETIRED [employer]  Tobacco Use   Smoking status: Former    Current packs/day: 0.00    Average packs/day: 0.5 packs/day for 56.0 years (28.0 ttl pk-yrs)    Types: Cigarettes    Start date: 11/13/1965    Quit date: 11/13/2021    Years since quitting: 1.3   Smokeless tobacco: Never   Tobacco comments:    smoking 2 cigarettes/day as of 10/23/20  Vaping Use   Vaping status: Never Used  Substance and Sexual Activity   Alcohol use: No    Alcohol/week: 0.0 standard drinks of alcohol   Drug use: No   Sexual activity: Not on file  Other Topics Concern   Not  on file  Social History Narrative   Patient reports it being difficult to pay for everything due to outstanding medical bills from hospital stay last year but states she is able to keep up with basic expenses.  Patient does have some concerns with her house's condition due to a water leak- had her roof repaired last month but has some leaking in the front which has affected her porch.  Patient owns her own car and is able to drive herself but has some concerns about the reliability of her car- has gotten taxi to the clinic for appointment in the past when her car broke down.   Social Determinants of Health   Financial Resource Strain: Low Risk  (06/07/2018)   Overall Financial Resource Strain (CARDIA)    Difficulty of Paying Living Expenses: Not hard at all  Recent Concern: Financial Resource Strain - Medium Risk (05/21/2018)   Overall Financial Resource Strain (CARDIA)    Difficulty of Paying Living Expenses: Somewhat hard  Food Insecurity: No Food Insecurity (12/15/2022)   Hunger Vital Sign    Worried About Running Out of Food in the Last Year: Never true    Ran Out of Food in the Last Year: Never true  Transportation Needs: No Transportation Needs (12/15/2022)   PRAPARE - Administrator, Civil Service (Medical): No    Lack of Transportation (Non-Medical): No  Physical Activity: Not on file   Stress: Stress Concern Present (02/22/2023)   [employer] of Occupational Health - Occupational Stress Questionnaire    Feeling of Stress : To some extent  Social Connections: Not on file  Intimate Partner Violence: Not At Risk (06/09/2022)   Humiliation, Afraid, Rape, and Kick questionnaire    Fear of Current or Ex-Partner: No    Emotionally Abused: No    Physically Abused: No    Sexually Abused: No    Physical Exam      Future Appointments  Date Time Provider Department Center  03/15/2023  2:45 PM [name], MD [facility]  04/06/2023  1:00 PM [facility] ECHO OP 1 [facility]  04/06/2023  2:00 PM Bensimhon, [name], MD [facility] None  04/18/2023  7:05 AM [facility] DEVICE REMOTES [facility]  07/18/2023  7:00 AM [facility] DEVICE REMOTES [facility]  10/17/2023  7:00 AM [facility] DEVICE REMOTES [facility]

## 2023-03-14 ENCOUNTER — Telehealth: Payer: Self-pay | Admitting: Radiology

## 2023-03-14 NOTE — Telephone Encounter (Signed)
Spoke with Pt tried to schedule for a Medicare annual wellness visit. she stated she did a Visit with Medicare already a couple weeks ago.

## 2023-03-15 ENCOUNTER — Encounter: Payer: Self-pay | Admitting: Internal Medicine

## 2023-03-15 ENCOUNTER — Ambulatory Visit: Payer: Medicare Other | Attending: Internal Medicine | Admitting: Internal Medicine

## 2023-03-15 VITALS — BP 138/90 | HR 60 | Ht 67.0 in | Wt 171.8 lb

## 2023-03-15 DIAGNOSIS — I5022 Chronic systolic (congestive) heart failure: Secondary | ICD-10-CM

## 2023-03-15 NOTE — Progress Notes (Signed)
HPI [name] returns for followup. She is a pleasant 77 yo woman with uncontrolled atrial fib and what was initially thought a tachy induced CM s/p PPM insertion and AV node ablation. She did not have improvement in her LV function despite rate control. She has had some return in her AV conduction. I tried to upgrade her to a biv ICD but she was found to have an occluded left subclavian vein. She has previously been offered extraction and biv upgrade. When I saw her last we increased her paced rate to 60 and she is a little better and her QRS is actually narrow when she comes in on her own whic she does about 50% of the time. No syncope. Repeat echo showed an EF of 25% and PA pressure in the 50's.         Allergies  Allergen Reactions   Bee Venom Anaphylaxis   Ivp Dye [Iodinated Contrast Media] Anaphylaxis   Ace Inhibitors        angioedema   Atenolol        severe headaches   Codeine Nausea And Vomiting   Entresto [Sacubitril-Valsartan] Other (See Comments)      Chest pain    Isosorbide        Severe headaches   Shrimp [Shellfish Allergy] Swelling   Simvastatin        not sure   Jardiance [Empagliflozin] Other (See Comments)      Caused Boils     Penicillin G Rash      Allergies  Allergen Reactions   Bee Venom Anaphylaxis   Ivp Dye [Iodinated Contrast Media] Anaphylaxis   Ace Inhibitors     angioedema   Atenolol     severe headaches   Codeine Nausea And Vomiting   Entresto [Sacubitril-Valsartan] Other (See Comments)    Chest pain    Isosorbide     Severe headaches   Shrimp [Shellfish Allergy] Swelling   Simvastatin     not sure   Jardiance [Empagliflozin] Other (See Comments)    Caused Boils    Penicillin G Rash     Current Outpatient Medications  Medication Sig Dispense Refill   acetaminophen (TYLENOL) 325 MG tablet Take 2 tablets (650 mg total) by mouth every 4 (four) hours as needed for headache or mild pain.     allopurinol (ZYLOPRIM) 100 MG tablet  Take 100 mg by mouth daily.     atorvastatin (LIPITOR) 10 MG tablet TAKE 1 TABLET BY MOUTH EVERYDAY AT BEDTIME 90 tablet 0   gabapentin (NEURONTIN) 100 MG capsule Take 1 capsule (100 mg total) by mouth at bedtime. 90 capsule 0   hydrALAZINE (APRESOLINE) 25 MG tablet TAKE 2 TABLETS (50 MG TOTAL) BY MOUTH 3 (THREE) TIMES DAILY. 270 tablet 3   losartan (COZAAR) 50 MG tablet Take 0.5 tablets (25 mg total) by mouth daily. 30 tablet 6   metFORMIN (GLUCOPHAGE) 500 MG tablet Take 1 tablet (500 mg total) by mouth daily with breakfast. 90 tablet 0   metoprolol succinate (TOPROL-XL) 100 MG 24 hr tablet Take 100 mg by mouth daily.     Naphazoline HCl (CLEAR EYES OP) Place 1 drop into both eyes daily as needed (irritation).     ONETOUCH ULTRA test strip 2 (two) times daily. as directed     pantoprazole (PROTONIX) 40 MG tablet TAKE 1 TABLET BY MOUTH EVERY DAY 90 tablet 0   Rivaroxaban (XARELTO) 15 MG TABS tablet Take 1 tablet (15 mg  total) by mouth daily with supper. 30 tablet 8   spironolactone (ALDACTONE) 25 MG tablet Take 1 tablet (25 mg total) by mouth daily. 30 tablet 11   sucralfate (CARAFATE) 1 GM/10ML suspension Take 10 mLs (1 g total) by mouth 4 (four) times daily -  with meals and at bedtime. 420 mL 0   torsemide (DEMADEX) 20 MG tablet Take 1 tablet (20 mg total) by mouth daily. 90 tablet 3   TRELEGY ELLIPTA 100-62.5-25 MCG/ACT AEPB Inhale ONE PUFF into THE lungs daily 120 each 1   No current facility-administered medications for this visit.     Past Medical History:  Diagnosis Date   (HFimpEF) heart failure with improved ejection fraction (HCC)    a. 07/2018 Echo: EF 30-35%; b. 11/2019 Echo: EF 55-60%, no rwma, Gr2 DD, Nl RV size/fxn. Mild BAE. Mild MR/AI.   Arthritis    Asthma    Atopic dermatitis    CAD (coronary artery disease)    a. 2016 s/p CABG x 2 (LIMA->LAD, VG->OM); b. 08/2018 MV: EF 44%, no ischemia/infact.   Cardiac arrest (HCC) 10/2014   Cardiomyopathy, ischemic    a. 07/2018  Echo: EF 30-35%; 11/2019 Echo: EF 55-60%.   Carotid arterial disease (HCC)    a. 11/2019 Carotid U/S   CHF (congestive heart failure) (HCC)    CKD (chronic kidney disease), stage III (HCC)    COPD (chronic obstructive pulmonary disease) (HCC)    Diabetes mellitus without complication (HCC)    Dysrhythmia    Esophageal dilatation 2013   GERD (gastroesophageal reflux disease)    Glaucoma    Gout    Headache    Hypertension    Hypokalemia    Idiopathic angioedema    LGI bleed 08/06/2017   a. felt to be hemorrhoidal during that admission (no drop in Hgb).   Lower back pain    Paroxysmal atrial fibrillation (HCC)    a. Dx 2016-->h/o difficult to control rates (complicated by noncompliance), not felt to be a candidate for ablation or antiarrhythmic due to noncompliance; b. Recurrent AF 2021 - converted w/ IV dilt; c. CHA2DS2VASc = 7-->Xarelto.   Personal history of noncompliance with medical treatment, presenting hazards to health    Prediabetes    Presence of permanent cardiac pacemaker    S/P CABG x 2 with clipping of LA appendage 11/14/2014   LIMA to LAD, SVG to OM, EVH via right thigh   Urine incontinence    Uterine fibroid     ROS:   All systems reviewed and negative except as noted in the HPI.   Past Surgical History:  Procedure Laterality Date   AV NODE ABLATION N/A 01/18/2021   Procedure: AV NODE ABLATION;  Surgeon: [name], MD;  [facility]AS[facility];  Service: Cardiovascular;  Laterality: N/A;   BALLOON DILATION N/A 10/10/2019   Procedure: BALLOON DILATION;  Surgeon: Kathi Der, MD;  Location: [facility]OS[facility];  Service: Gastroenterology;  Laterality: N/A;   BIOPSY  10/10/2019   Procedure: BIOPSY;  Surgeon: Kathi Der, MD;  [facility]: [facility];  Service: Gastroenterology;;   BIOPSY  10/11/2019   Procedure: BIOPSY;  Surgeon: Kathi Der, MD;  Location: [facility];  Service: Gastroenterology;;   Earnstine Regal N/A 11/21/2022   Procedure:  BIVI  ICD UPGRADE;  Surgeon: [name], MD;  Location: [facility] INVASIVE CV LAB;  Service: Cardiovascular;  Laterality: N/A;   CARDIOVERSION N/A 11/18/2014   Procedure: CARDIOVERSION;  Surgeon: Chrystie Nose, MD;  Location: [facility]  OR;  Service: Cardiovascular;  Laterality: N/A;   CARDIOVERSION N/A 12/25/2017   Procedure: CARDIOVERSION;  Surgeon: Dolores Patty, MD;  Location: [facility] ENDOSCOPY;  Service: Cardiovascular;  Laterality: N/A;   CLIPPING OF ATRIAL APPENDAGE N/A 11/14/2014   Procedure: CLIPPING OF ATRIAL APPENDAGE;  Surgeon: Purcell Nails, MD;  Location: [facility] OR;  Service: Open Heart Surgery;  Laterality: N/A;   COLONOSCOPY  2013   COLONOSCOPY WITH PROPOFOL N/A 10/11/2019   Procedure: COLONOSCOPY WITH PROPOFOL;  Surgeon: Kathi Der, MD;  [facility]: [facility];  Service: Gastroenterology;  Laterality: N/A;   CORONARY ARTERY BYPASS GRAFT N/A 11/14/2014   Procedure: CORONARY ARTERY BYPASS GRAFTING (CABG)TIMES 2 USING LEFT INTERNAL MAMMARY ARTERY AND RIGHT SAPHENOUS VEIN HARVESTED ENDOSCOPICALLY;  Surgeon: Purcell Nails, MD;  Location: [facility] OR;  Service: Open Heart Surgery;  Laterality: N/A;   ESOPHAGOGASTRODUODENOSCOPY (EGD) WITH PROPOFOL N/A 10/10/2019   Procedure: ESOPHAGOGASTRODUODENOSCOPY (EGD) WITH PROPOFOL;  Surgeon: Kathi Der, MD;  [facility];  Service: Gastroenterology;  Laterality: N/A;   ESOPHAGOGASTRODUODENOSCOPY (EGD) WITH PROPOFOL N/A 10/27/2019   Procedure: ESOPHAGOGASTRODUODENOSCOPY (EGD) WITH PROPOFOL;  Surgeon: Kerin Salen, MD;  Location: [facility] ENDOSCOPY;  Service: Gastroenterology;  Laterality: N/A;   GIVENS CAPSULE STUDY N/A 10/27/2019   Procedure: GIVENS CAPSULE STUDY;  Surgeon: Kerin Salen, MD;  Location: [facility] ENDOSCOPY;  Service: Gastroenterology;  Laterality: N/A;   INSERT / REPLACE / REMOVE PACEMAKER     LEFT HEART CATHETERIZATION WITH CORONARY ANGIOGRAM N/A 11/04/2014   Procedure: LEFT HEART CATHETERIZATION WITH CORONARY ANGIOGRAM;  Surgeon:  Lennette Bihari, MD;  Location: [facility] CATH LAB;  Service: Cardiovascular;  Laterality: N/A;   PACEMAKER IMPLANT N/A 01/18/2021   Procedure: PACEMAKER IMPLANT;  Surgeon: [name], MD;  [facility];  Service: Cardiovascular;  Laterality: N/A;   PACEMAKER IMPLANT Left    POLYPECTOMY  10/11/2019   Procedure: POLYPECTOMY;  Surgeon: Kathi Der, MD;  [facility]OS[facility];  Service: Gastroenterology;;   RIGHT HEART CATH N/A 06/08/2022   Procedure: RIGHT HEART CATH;  Surgeon: Dolores Patty, MD;  Location: [facility] INVASIVE CV LAB;  Service: Cardiovascular;  Laterality: N/A;   TEE WITHOUT CARDIOVERSION N/A 11/14/2014   Procedure: TRANSESOPHAGEAL ECHOCARDIOGRAM (TEE);  Surgeon: Purcell Nails, MD;  Location: [facility] OR;  Service: Open Heart Surgery;  Laterality: N/A;   TEE WITHOUT CARDIOVERSION N/A 12/25/2017   Procedure: TRANSESOPHAGEAL ECHOCARDIOGRAM (TEE);  Surgeon: Dolores Patty, MD;  Location: [facility] ENDOSCOPY;  Service: Cardiovascular;  Laterality: N/A;   TEMPORARY PACEMAKER INSERTION  11/04/2014   Procedure: TEMPORARY PACEMAKER INSERTION;  Surgeon: Lennette Bihari, MD;  Location: [facility] CATH LAB;  Service: Cardiovascular;;   TOOTH EXTRACTION       Family History  Problem Relation Age of Onset   Cancer Mother        LYMPHOMA   Heart disease Father    Other Father        TB   CVA Sister    Prostate cancer Brother 49   Diabetes Brother      Social History   Socioeconomic History   Marital status: Widowed    Spouse name: Not on file   Number of children: 1   Years of education: 49   Highest education level: Not on file  Occupational History   Occupation: RETIRED [employer]  Tobacco Use   Smoking status: Former    Current packs/day: 0.00    Average packs/day: 0.5 packs/day for 56.0 years (28.0 ttl pk-yrs)  Types: Cigarettes    Start date: 11/13/1965    Quit date: 11/13/2021    Years since quitting: 1.3   Smokeless tobacco: Never   Tobacco  comments:    smoking 2 cigarettes/day as of 10/23/20  Vaping Use   Vaping status: Never Used  Substance and Sexual Activity   Alcohol use: No    Alcohol/week: 0.0 standard drinks of alcohol   Drug use: No   Sexual activity: Not on file  Other Topics Concern   Not on file  Social History Narrative   Patient reports it being difficult to pay for everything due to outstanding medical bills from hospital stay last year but states she is able to keep up with basic expenses.  Patient does have some concerns with her house's condition due to a water leak- had her roof repaired last month but has some leaking in the front which has affected her porch.  Patient owns her own car and is able to drive herself but has some concerns about the reliability of her car- has gotten taxi to the clinic for appointment in the past when her car broke down.   Social Determinants of Health   Financial Resource Strain: Low Risk  (06/07/2018)   Overall Financial Resource Strain (CARDIA)    Difficulty of Paying Living Expenses: Not hard at all  Recent Concern: Financial Resource Strain - Medium Risk (05/21/2018)   Overall Financial Resource Strain (CARDIA)    Difficulty of Paying Living Expenses: Somewhat hard  Food Insecurity: No Food Insecurity (12/15/2022)   Hunger Vital Sign    Worried About Running Out of Food in the Last Year: Never true    Ran Out of Food in the Last Year: Never true  Transportation Needs: No Transportation Needs (12/15/2022)   PRAPARE - Administrator, Civil Service (Medical): No    Lack of Transportation (Non-Medical): No  Physical Activity: Not on file  Stress: Stress Concern Present (02/22/2023)   [employer] of Occupational Health - Occupational Stress Questionnaire    Feeling of Stress : To some extent  Social Connections: Not on file  Intimate Partner Violence: Not At Risk (06/09/2022)   Humiliation, Afraid, Rape, and Kick questionnaire    Fear of Current or  Ex-Partner: No    Emotionally Abused: No    Physically Abused: No    Sexually Abused: No     BP (!) 138/90   Pulse 60   Ht 5\' 7"  (1.702 m)   Wt 171 lb 12.8 oz (77.9 kg)   SpO2 98%   BMI 26.91 kg/m   Physical Exam:  Well appearing NAD HEENT: Unremarkable Neck:  No JVD, no thyromegally Lymphatics:  No adenopathy Back:  No CVA tenderness Lungs:  Clear with no wheezes HEART:  IRegular rate rhythm, no murmurs, no rubs, no clicks Abd:  soft, positive bowel sounds, no organomegally, no rebound, no guarding Ext:  2 plus pulses, no edema, no cyanosis, no clubbing Skin:  No rashes no nodules Neuro:  CN II through XII intact, motor grossly intact  DEVICE  Normal device function.  See PaceArt for details.   Assess/Plan: Perm atrial fib =- her VR is controlled. No change in meds. Chronic systolic heart failure - her symptoms are better. Class 2. Continue current meds. CHB - she has some conduction now and is pacing about 50%.  Coags - she will continue to take her xarelto.  Sharlot Gowda Zoye Chandra,MD

## 2023-03-15 NOTE — Patient Instructions (Signed)
Medication Instructions:  Your physician recommends that you continue on your current medications as directed. Please refer to the Current Medication list given to you today.  *If you need a refill on your cardiac medications before your next appointment, please call your pharmacy*  Lab Work: None ordered today. If you have labs (blood work) drawn today and your tests are completely normal, you will receive your results only by: MyChart Message (if you have MyChart) OR A paper copy in the mail If you have any lab test that is abnormal or we need to change your treatment, we will call you to review the results.  Testing/Procedures: None ordered today.  Follow-Up: At [facility], you and your health needs are our priority.  As part of our continuing mission to provide you with exceptional heart care, we have created designated Provider Care Teams.  These Care Teams include your primary Cardiologist (physician) and Advanced Practice Providers (APPs -  Physician Assistants and Nurse Practitioners) who all work together to provide you with the care you need, when you need it.  Your next appointment:   1 year(s)  The format for your next appointment:   In Person  Provider:   You may see Lewayne Bunting, MD or one of the following Advanced Practice Providers on your designated Care Team:   Francis Dowse, [place] [address]" [place], [place] Sherie Don, NP Canary Brim, NP{

## 2023-03-16 ENCOUNTER — Other Ambulatory Visit (HOSPITAL_COMMUNITY): Payer: Self-pay | Admitting: Emergency Medicine

## 2023-03-16 ENCOUNTER — Telehealth: Payer: Self-pay | Admitting: Cardiovascular Disease

## 2023-03-16 NOTE — Telephone Encounter (Signed)
   Pt c/o of Chest Pain: STAT if active CP, including tightness, pressure, jaw pain, radiating pain to shoulder/upper arm/back, CP unrelieved by Nitro. Symptoms reported of SOB, nausea, vomiting, sweating.  1. Are you having CP right now? Hurting under chest through her back and rib area  2. Are you experiencing any other symptoms (ex. SOB, nausea, vomiting, sweating)? no   3. Is your CP continuous or coming and going? No chest pains- hurting through her back   4. Have you taken Nitroglycerin? no   5. How long have you been experiencing CP? -about a month-patient wanted to be seen- I made her an appointment on 03-27-23 with [name] - Please call to evaluate   6. If NO CP at time of call then end call with telling Pt to call back or call 911 if Chest pain returns prior to return call from triage team.

## 2023-03-16 NOTE — Telephone Encounter (Signed)
Returned pt call. No answer or vm to leave a message

## 2023-03-16 NOTE — Progress Notes (Signed)
Paramedicine Encounter    Patient ID: [name] [name], female    DOB: April 02, 1946, 77 y.o.   MRN: [id number]   Complaints NONE  Assessment A&O x 4, skin W&D w/ good color.  Lung sounds clear and equal bilat.  No peripheral edema noted.    Compliance with meds  Not taking her Losartan or Metoprolol as prescribed. Gives conflicting information on these meds.  Pill box filled N/A  Refills needed   Meds changes since last visit NONE    Social changes NONE  CBG 117  BP 130/80 (BP Location: Left Arm, Patient Position: Sitting, Cuff Size: Normal)   Pulse 70   Resp 16   Wt 171 lb (77.6 kg)   SpO2 99%   BMI 26.78 kg/m  Weight yesterday-not taking Last visit weight-174lb  Today finds [name] doing well despite the fact that I am doubtful she is being as compliant with her meds as she says due to bottles having too many pills remaining in the bottles. She tells me that she saw Dr. Ladona Ridgel yesterday and she said that he told her she looked "great".   Today she tells me that she is taking her Losartan but only wants to take 1/4th of a pill.  I explained to her that she needs to take the 1/2 as prescribed finally she agreed to do same.  Cut some pills in half for her to have in the bottle to make it easier for her.  Also she tells me that she is not taking her Metoprolol and the bottle is not in her pill bag.  When I asked her about it she says, "well maybe I haven't picked it up from the pharmacy."  She advised she would go by and check on the status.  I will follow up tomorrow with her.  ACTION: Home visit completed  [name] [phone] 03/16/23  Patient Care Team: [name], MD as PCP - General (Internal Medicine) [name], MD as PCP - Cardiology (Cardiology) [name], MD as PCP - Electrophysiology (Cardiology) Bensimhon, [name], MD as PCP - Advanced Heart Failure (Cardiology) Chrystie Nose, MD as Consulting Physician  (Cardiology)  Patient Active Problem List   Diagnosis Date Noted   Dysuria 12/29/2022   Cough productive of purulent sputum 12/29/2022   Type 2 diabetes mellitus with hyperlipidemia (HCC) 12/14/2022   Acute cough 12/12/2022   Ceruminosis, bilateral 06/17/2022   Flu vaccine need 06/17/2022   Chronic combined systolic and diastolic CHF (congestive heart failure) (HCC) 06/17/2022   Encounter for general adult medical examination with abnormal findings 06/16/2022   Chronic anticoagulation 06/06/2022   Mitral regurgitation 06/06/2022   Controlled type 2 diabetes mellitus without complication, without long-term current use of insulin (HCC) 06/06/2022   Pacemaker 02/22/2022   Hypertensive urgency 06/08/2021   Obstructive sleep apnea 09/21/2020   Obesity (BMI 30.0-34.9) 02/10/2020   COPD (chronic obstructive pulmonary disease) (HCC) 10/26/2019   Thrombocytopenia (HCC) 09/14/2017   Medication noncompliance due to cognitive impairment 09/06/2017   Persistent atrial fibrillation (HCC) 08/04/2017   Cardiomyopathy, ischemic 02/08/2017   CKD (chronic kidney disease), stage III (HCC) 01/30/2017   Coronary artery disease due to lipid rich plaque    Essential hypertension     Current Outpatient Medications:    acetaminophen (TYLENOL) 325 MG tablet, Take 2 tablets (650 mg total) by mouth every 4 (four) hours as needed for headache or mild pain., Disp: , Rfl:    allopurinol (ZYLOPRIM) 100  MG tablet, Take 100 mg by mouth daily., Disp: , Rfl:    atorvastatin (LIPITOR) 10 MG tablet, TAKE 1 TABLET BY MOUTH EVERYDAY AT BEDTIME, Disp: 90 tablet, Rfl: 0   gabapentin (NEURONTIN) 100 MG capsule, Take 1 capsule (100 mg total) by mouth at bedtime., Disp: 90 capsule, Rfl: 0   hydrALAZINE (APRESOLINE) 25 MG tablet, TAKE 2 TABLETS (50 MG TOTAL) BY MOUTH 3 (THREE) TIMES DAILY., Disp: 270 tablet, Rfl: 3   losartan (COZAAR) 50 MG tablet, Take 0.5 tablets (25 mg total) by mouth daily., Disp: 30 tablet, Rfl: 6    metFORMIN (GLUCOPHAGE) 500 MG tablet, Take 1 tablet (500 mg total) by mouth daily with breakfast., Disp: 90 tablet, Rfl: 0   metoprolol succinate (TOPROL-XL) 100 MG 24 hr tablet, Take 100 mg by mouth daily., Disp: , Rfl:    Naphazoline HCl (CLEAR EYES OP), Place 1 drop into both eyes daily as needed (irritation)., Disp: , Rfl:    ONETOUCH ULTRA test strip, 2 (two) times daily. as directed, Disp: , Rfl:    pantoprazole (PROTONIX) 40 MG tablet, TAKE 1 TABLET BY MOUTH EVERY DAY, Disp: 90 tablet, Rfl: 0   Rivaroxaban (XARELTO) 15 MG TABS tablet, Take 1 tablet (15 mg total) by mouth daily with supper., Disp: 30 tablet, Rfl: 8   spironolactone (ALDACTONE) 25 MG tablet, Take 1 tablet (25 mg total) by mouth daily., Disp: 30 tablet, Rfl: 11   sucralfate (CARAFATE) 1 GM/10ML suspension, Take 10 mLs (1 g total) by mouth 4 (four) times daily -  with meals and at bedtime., Disp: 420 mL, Rfl: 0   torsemide (DEMADEX) 20 MG tablet, Take 1 tablet (20 mg total) by mouth daily., Disp: 90 tablet, Rfl: 3   TRELEGY ELLIPTA 100-62.5-25 MCG/ACT AEPB, Inhale ONE PUFF into THE lungs daily, Disp: 120 each, Rfl: 1 Allergies  Allergen Reactions   Bee Venom Anaphylaxis   Ivp Dye [Iodinated Contrast Media] Anaphylaxis   Ace Inhibitors     angioedema   Atenolol     severe headaches   Codeine Nausea And Vomiting   Entresto [Sacubitril-Valsartan] Other (See Comments)    Chest pain    Isosorbide     Severe headaches   Shrimp [Shellfish Allergy] Swelling   Simvastatin     not sure   Jardiance [Empagliflozin] Other (See Comments)    Caused Boils    Penicillin G Rash     Social History   Socioeconomic History   Marital status: Widowed    Spouse name: Not on file   Number of children: 1   Years of education: 25   Highest education level: Not on file  Occupational History   Occupation: RETIRED [employer]  Tobacco Use   Smoking status: Former    Current packs/day: 0.00    Average packs/day: 0.5  packs/day for 56.0 years (28.0 ttl pk-yrs)    Types: Cigarettes    Start date: 11/13/1965    Quit date: 11/13/2021    Years since quitting: 1.3   Smokeless tobacco: Never   Tobacco comments:    smoking 2 cigarettes/day as of 10/23/20  Vaping Use   Vaping status: Never Used  Substance and Sexual Activity   Alcohol use: No    Alcohol/week: 0.0 standard drinks of alcohol   Drug use: No   Sexual activity: Not on file  Other Topics Concern   Not on file  Social History Narrative   Patient reports it being difficult to pay for everything due to  outstanding medical bills from hospital stay last year but states she is able to keep up with basic expenses.  Patient does have some concerns with her house's condition due to a water leak- had her roof repaired last month but has some leaking in the front which has affected her porch.  Patient owns her own car and is able to drive herself but has some concerns about the reliability of her car- has gotten taxi to the clinic for appointment in the past when her car broke down.   Social Determinants of Health   Financial Resource Strain: Low Risk  (06/07/2018)   Overall Financial Resource Strain (CARDIA)    Difficulty of Paying Living Expenses: Not hard at all  Recent Concern: Financial Resource Strain - Medium Risk (05/21/2018)   Overall Financial Resource Strain (CARDIA)    Difficulty of Paying Living Expenses: Somewhat hard  Food Insecurity: No Food Insecurity (12/15/2022)   Hunger Vital Sign    Worried About Running Out of Food in the Last Year: Never true    Ran Out of Food in the Last Year: Never true  Transportation Needs: No Transportation Needs (12/15/2022)   PRAPARE - Administrator, Civil Service (Medical): No    Lack of Transportation (Non-Medical): No  Physical Activity: Not on file  Stress: Stress Concern Present (02/22/2023)   [employer] of Occupational Health - Occupational Stress Questionnaire    Feeling of Stress : To  some extent  Social Connections: Not on file  Intimate Partner Violence: Not At Risk (06/09/2022)   Humiliation, Afraid, Rape, and Kick questionnaire    Fear of Current or Ex-Partner: No    Emotionally Abused: No    Physically Abused: No    Sexually Abused: No    Physical Exam      Future Appointments  Date Time Provider Department Center  04/06/2023  1:00 PM [facility] ECHO OP 1 [facility]  04/06/2023  2:00 PM Bensimhon, [name], MD [facility] None  04/18/2023  7:05 AM [facility] DEVICE REMOTES [facility]  07/18/2023  7:00 AM [facility] DEVICE REMOTES [facility]  10/17/2023  7:00 AM [facility] DEVICE REMOTES [facility]

## 2023-03-17 NOTE — Telephone Encounter (Signed)
Patient is returning call.  °

## 2023-03-17 NOTE — Telephone Encounter (Signed)
Call to patient.  Home number rings busy and unable to connect.  Call to Mobile and LVM to please call office to assess for sooner appt or ED if necessary

## 2023-03-17 NOTE — Telephone Encounter (Signed)
Left voicemail to return call to office.

## 2023-03-17 NOTE — Telephone Encounter (Signed)
Call to patient and LM again with no response.  Spoke to daughter and explained multiple times that we are trying to evaluate the patient to see if she needs to go to the ED or if the appointment is sufficient time. She finally states understands.  She states she will call the patient and have her call back or if unble to reach her, she (daughter) will call back

## 2023-03-20 NOTE — Telephone Encounter (Signed)
Unable to contact patient.  She has not returned any calls nor has daughter.

## 2023-03-21 DIAGNOSIS — J40 Bronchitis, not specified as acute or chronic: Secondary | ICD-10-CM | POA: Diagnosis not present

## 2023-03-21 DIAGNOSIS — R0602 Shortness of breath: Secondary | ICD-10-CM | POA: Diagnosis not present

## 2023-03-25 NOTE — Progress Notes (Unsigned)
Cardiology Clinic Note   Patient Name: [name] [name] Date of Encounter: 03/27/2023  Primary Care Provider:  [name], MD Primary Cardiologist:  Donato Schultz, MD  Patient Profile    77 year old female with history of permanent atrial fibrillation, tachycardic induced cardiomyopathy status post permanent pacemaker insertion and AV nodal ablation, on Xarelto, unable to upgrade to BiV ICD because of occluded left subclavian vein.  She is followed by Dr. Sharrell Ku Electrophysiology.  Other history includes chronic systolic heart failure (LVEF of 25% per echo on 12/13/2022, with moderately reduced right ventricular function with moderately elevated PAP of 55.2 mmHg.); Peripheral arterial disease seen by Dr. Arlis Porta for abnormal ABI.  She was asymptomatic and for his assessment there was no indication for angiography or revascularization.  She also has a history of tobacco abuse, COPD, hypertension, hyperlipidemia, chronic kidney disease, and diabetes.  Past Medical History    Past Medical History:  Diagnosis Date   (HFimpEF) heart failure with improved ejection fraction (HCC)    a. 07/2018 Echo: EF 30-35%; b. 11/2019 Echo: EF 55-60%, no rwma, Gr2 DD, Nl RV size/fxn. Mild BAE. Mild MR/AI.   Arthritis    Asthma    Atopic dermatitis    CAD (coronary artery disease)    a. 2016 s/p CABG x 2 (LIMA->LAD, VG->OM); b. 08/2018 MV: EF 44%, no ischemia/infact.   Cardiac arrest (HCC) 10/2014   Cardiomyopathy, ischemic    a. 07/2018 Echo: EF 30-35%; 11/2019 Echo: EF 55-60%.   Carotid arterial disease (HCC)    a. 11/2019 Carotid U/S   CHF (congestive heart failure) (HCC)    CKD (chronic kidney disease), stage III (HCC)    COPD (chronic obstructive pulmonary disease) (HCC)    Diabetes mellitus without complication (HCC)    Dysrhythmia    Esophageal dilatation 2013   GERD (gastroesophageal reflux disease)    Glaucoma    Gout    Headache    Hypertension    Hypokalemia    Idiopathic  angioedema    LGI bleed 08/06/2017   a. felt to be hemorrhoidal during that admission (no drop in Hgb).   Lower back pain    Paroxysmal atrial fibrillation (HCC)    a. Dx 2016-->h/o difficult to control rates (complicated by noncompliance), not felt to be a candidate for ablation or antiarrhythmic due to noncompliance; b. Recurrent AF 2021 - converted w/ IV dilt; c. CHA2DS2VASc = 7-->Xarelto.   Personal history of noncompliance with medical treatment, presenting hazards to health    Prediabetes    Presence of permanent cardiac pacemaker    S/P CABG x 2 with clipping of LA appendage 11/14/2014   LIMA to LAD, SVG to OM, EVH via right thigh   Urine incontinence    Uterine fibroid    Past Surgical History:  Procedure Laterality Date   AV NODE ABLATION N/A 01/18/2021   Procedure: AV NODE ABLATION;  Surgeon: [name], MD;  Location: [facility];  Service: Cardiovascular;  Laterality: N/A;   BALLOON DILATION N/A 10/10/2019   Procedure: BALLOON DILATION;  Surgeon: Kathi Der, MD;  Location: [facility] ENDOS[facility];  Service: Gastroenterology;  Laterality: N/A;   BIOPSY  10/10/2019   Procedure: BIOPSY;  Surgeon: Kathi Der, MD;  Location: [facility];  Service: Gastroenterology;;   BIOPSY  10/11/2019   Procedure: BIOPSY;  Surgeon: Kathi Der, MD;  Location: [facility];  Service: Gastroenterology;;   Earnstine Regal N/A 11/21/2022   Procedure: BIVI  ICD UPGRADE;  Surgeon: Ladona Ridgel,  Doylene Canning, MD;  Location: [facility];  Service: Cardiovascular;  Laterality: N/A;   CARDIOVERSION N/A 11/18/2014   Procedure: CARDIOVERSION;  Surgeon: Chrystie Nose, MD;  Location: [facility] OR;  Service: Cardiovascular;  Laterality: N/A;   CARDIOVERSION N/A 12/25/2017   Procedure: CARDIOVERSION;  Surgeon: Dolores Patty, MD;  Location: [facility] ENDOSCOPY;  Service: Cardiovascular;  Laterality: N/A;   CLIPPING OF ATRIAL APPENDAGE N/A 11/14/2014   Procedure: CLIPPING OF ATRIAL APPENDAGE;  Surgeon:  Purcell Nails, MD;  Location: [facility] OR;  Service: Open Heart Surgery;  Laterality: N/A;   COLONOSCOPY  2013   COLONOSCOPY WITH PROPOFOL N/A 10/11/2019   Procedure: COLONOSCOPY WITH PROPOFOL;  Surgeon: Kathi Der, MD;  Location: [facility];  Service: Gastroenterology;  Laterality: N/A;   CORONARY ARTERY BYPASS GRAFT N/A 11/14/2014   Procedure: CORONARY ARTERY BYPASS GRAFTING (CABG)TIMES 2 USING LEFT INTERNAL MAMMARY ARTERY AND RIGHT SAPHENOUS VEIN HARVESTED ENDOSCOPICALLY;  Surgeon: Purcell Nails, MD;  Location: [facility] OR;  Service: Open Heart Surgery;  Laterality: N/A;   ESOPHAGOGASTRODUODENOSCOPY (EGD) WITH PROPOFOL N/A 10/10/2019   Procedure: ESOPHAGOGASTRODUODENOSCOPY (EGD) WITH PROPOFOL;  Surgeon: Kathi Der, MD;  Location: [facility];  Service: Gastroenterology;  Laterality: N/A;   ESOPHAGOGASTRODUODENOSCOPY (EGD) WITH PROPOFOL N/A 10/27/2019   Procedure: ESOPHAGOGASTRODUODENOSCOPY (EGD) WITH PROPOFOL;  Surgeon: Kerin Salen, MD;  Location: [facility] ENDOSCOPY;  Service: Gastroenterology;  Laterality: N/A;   GIVENS CAPSULE STUDY N/A 10/27/2019   Procedure: GIVENS CAPSULE STUDY;  Surgeon: Kerin Salen, MD;  Location: [facility] ENDOSCOPY;  Service: Gastroenterology;  Laterality: N/A;   INSERT / REPLACE / REMOVE PACEMAKER     LEFT HEART CATHETERIZATION WITH CORONARY ANGIOGRAM N/A 11/04/2014   Procedure: LEFT HEART CATHETERIZATION WITH CORONARY ANGIOGRAM;  Surgeon: Lennette Bihari, MD;  Location: [facility] CATH LAB;  Service: Cardiovascular;  Laterality: N/A;   PACEMAKER IMPLANT N/A 01/18/2021   Procedure: PACEMAKER IMPLANT;  Surgeon: [name], MD;  [facility];  Service: Cardiovascular;  Laterality: N/A;   PACEMAKER IMPLANT Left    POLYPECTOMY  10/11/2019   Procedure: POLYPECTOMY;  Surgeon: Kathi Der, MD;  Location: [facility]OS[facility];  Service: Gastroenterology;;   RIGHT HEART CATH N/A 06/08/2022   Procedure: RIGHT HEART CATH;  Surgeon: Dolores Patty, MD;  Location: [facility]  INVASIVE CV LAB;  Service: Cardiovascular;  Laterality: N/A;   TEE WITHOUT CARDIOVERSION N/A 11/14/2014   Procedure: TRANSESOPHAGEAL ECHOCARDIOGRAM (TEE);  Surgeon: Purcell Nails, MD;  Location: [facility] OR;  Service: Open Heart Surgery;  Laterality: N/A;   TEE WITHOUT CARDIOVERSION N/A 12/25/2017   Procedure: TRANSESOPHAGEAL ECHOCARDIOGRAM (TEE);  Surgeon: Dolores Patty, MD;  Location: [facility] ENDOSCOPY;  Service: Cardiovascular;  Laterality: N/A;   TEMPORARY PACEMAKER INSERTION  11/04/2014   Procedure: TEMPORARY PACEMAKER INSERTION;  Surgeon: Lennette Bihari, MD;  Location: [facility] CATH LAB;  Service: Cardiovascular;;   TOOTH EXTRACTION      Allergies  Allergies  Allergen Reactions   Bee Venom Anaphylaxis   Ivp Dye [Iodinated Contrast Media] Anaphylaxis   Ace Inhibitors     angioedema   Atenolol     severe headaches   Codeine Nausea And Vomiting   Entresto [Sacubitril-Valsartan] Other (See Comments)    Chest pain    Isosorbide     Severe headaches   Shrimp [Shellfish Allergy] Swelling   Simvastatin     not sure   Jardiance [Empagliflozin] Other (See Comments)    Caused Boils    Penicillin G Rash    History of Present  Illness    Mrs. Punzel returns to the office today for ongoing assessment and management of coronary artery disease, hypertension, hyperlipidemia, permanent atrial fibrillation, HFrEF with a EF of 25% secondary to tachycardic induced cardiomyopathy.  She called our office on 03/16/2023 complaining of chest pain through her back and into her rib area.  She is here for further assessment.  Mrs. Greenlees states that she is feeling better and no longer has any rib pain or back pain.  She believes that is related to constipation.  She is taking Colace as needed, does use a fleets enema on occasion, or a suppository.  She feels that her constipation has gotten worse since having pacemaker implantation.  She also complains of neuropathy especially in her feet with numbness and  tingling.  She denies any dizziness, palpitations, dyspnea on exertion, volume overload symptoms of abdominal distention, or lower extremity edema.  Home Medications    Current Outpatient Medications  Medication Sig Dispense Refill   acetaminophen (TYLENOL) 325 MG tablet Take 2 tablets (650 mg total) by mouth every 4 (four) hours as needed for headache or mild pain.     allopurinol (ZYLOPRIM) 100 MG tablet Take 100 mg by mouth daily.     atorvastatin (LIPITOR) 10 MG tablet TAKE 1 TABLET BY MOUTH EVERYDAY AT BEDTIME 90 tablet 0   gabapentin (NEURONTIN) 100 MG capsule Take 1 capsule (100 mg total) by mouth at bedtime. 90 capsule 0   hydrALAZINE (APRESOLINE) 25 MG tablet TAKE 2 TABLETS (50 MG TOTAL) BY MOUTH 3 (THREE) TIMES DAILY. 270 tablet 3   losartan (COZAAR) 50 MG tablet Take 0.5 tablets (25 mg total) by mouth daily. 30 tablet 6   metFORMIN (GLUCOPHAGE) 500 MG tablet Take 1 tablet (500 mg total) by mouth daily with breakfast. 90 tablet 0   metoprolol succinate (TOPROL-XL) 100 MG 24 hr tablet Take 100 mg by mouth daily.     Naphazoline HCl (CLEAR EYES OP) Place 1 drop into both eyes daily as needed (irritation).     ONETOUCH ULTRA test strip 2 (two) times daily. as directed     pantoprazole (PROTONIX) 40 MG tablet TAKE 1 TABLET BY MOUTH EVERY DAY 90 tablet 0   Rivaroxaban (XARELTO) 15 MG TABS tablet Take 1 tablet (15 mg total) by mouth daily with supper. 30 tablet 8   spironolactone (ALDACTONE) 25 MG tablet Take 1 tablet (25 mg total) by mouth daily. 30 tablet 11   sucralfate (CARAFATE) 1 GM/10ML suspension Take 10 mLs (1 g total) by mouth 4 (four) times daily -  with meals and at bedtime. 420 mL 0   torsemide (DEMADEX) 20 MG tablet Take 1 tablet (20 mg total) by mouth daily. 90 tablet 3   TRELEGY ELLIPTA 100-62.5-25 MCG/ACT AEPB Inhale ONE PUFF into THE lungs daily 120 each 1   No current facility-administered medications for this visit.     Family History    Family History  Problem  Relation Age of Onset   Cancer Mother        LYMPHOMA   Heart disease Father    Other Father        TB   CVA Sister    Prostate cancer Brother 48   Diabetes Brother    She indicated that her mother is deceased. She indicated that her father is deceased. She indicated that both of her sisters are alive. She indicated that both of her brothers are alive. She indicated that her maternal grandmother is deceased. She  indicated that her maternal grandfather is deceased. She indicated that her paternal grandmother is deceased. She indicated that her paternal grandfather is deceased.  Social History    Social History   Socioeconomic History   Marital status: Widowed    Spouse name: Not on file   Number of children: 1   Years of education: 58   Highest education level: Not on file  Occupational History   Occupation: RETIRED [employer]  Tobacco Use   Smoking status: Former    Current packs/day: 0.00    Average packs/day: 0.5 packs/day for 56.0 years (28.0 ttl pk-yrs)    Types: Cigarettes    Start date: 11/13/1965    Quit date: 11/13/2021    Years since quitting: 1.3   Smokeless tobacco: Never   Tobacco comments:    smoking 2 cigarettes/day as of 10/23/20  Vaping Use   Vaping status: Never Used  Substance and Sexual Activity   Alcohol use: No    Alcohol/week: 0.0 standard drinks of alcohol   Drug use: No   Sexual activity: Not on file  Other Topics Concern   Not on file  Social History Narrative   Patient reports it being difficult to pay for everything due to outstanding medical bills from hospital stay last year but states she is able to keep up with basic expenses.  Patient does have some concerns with her house's condition due to a water leak- had her roof repaired last month but has some leaking in the front which has affected her porch.  Patient owns her own car and is able to drive herself but has some concerns about the reliability of her car- has gotten taxi to the  clinic for appointment in the past when her car broke down.   Social Determinants of Health   Financial Resource Strain: Low Risk  (06/07/2018)   Overall Financial Resource Strain (CARDIA)    Difficulty of Paying Living Expenses: Not hard at all  Recent Concern: Financial Resource Strain - Medium Risk (05/21/2018)   Overall Financial Resource Strain (CARDIA)    Difficulty of Paying Living Expenses: Somewhat hard  Food Insecurity: No Food Insecurity (12/15/2022)   Hunger Vital Sign    Worried About Running Out of Food in the Last Year: Never true    Ran Out of Food in the Last Year: Never true  Transportation Needs: No Transportation Needs (12/15/2022)   PRAPARE - Administrator, Civil Service (Medical): No    Lack of Transportation (Non-Medical): No  Physical Activity: Not on file  Stress: Stress Concern Present (02/22/2023)   [employer] of Occupational Health - Occupational Stress Questionnaire    Feeling of Stress : To some extent  Social Connections: Not on file  Intimate Partner Violence: Not At Risk (06/09/2022)   Humiliation, Afraid, Rape, and Kick questionnaire    Fear of Current or Ex-Partner: No    Emotionally Abused: No    Physically Abused: No    Sexually Abused: No     Review of Systems    General:  No chills, fever, night sweats or weight changes.  Chronic constipation Cardiovascular:  No chest pain, dyspnea on exertion, edema, orthopnea, palpitations, paroxysmal nocturnal dyspnea. Dermatological: No rash, lesions/masses Respiratory: No cough, dyspnea Urologic: No hematuria, dysuria Abdominal:   No nausea, vomiting, diarrhea, bright red blood per rectum, melena, or hematemesis Neurologic:  No visual changes, wkns, changes in mental status.  Chronic numbness and tingling in her feet.  Occasional numbness  and tingling of her left shoulder biceps area and forearm. All other systems reviewed and are otherwise negative except as noted above.  EKG  Interpretation Date/Time:  Monday March 27 2023 14:45:50 EDT Ventricular Rate:  64 PR Interval:    QRS Duration:  156 QT Interval:  472 QTC Calculation: 486 R Axis:   -41  Text Interpretation: Ventricular-paced rhythm with occasional Premature ventricular complexes When compared with ECG of 27-Mar-2023 14:45, No significant change was found Confirmed by [name] [phone]) on 03/27/2023 3:07:18 PM    Physical Exam    VS:  BP 130/80 (BP Location: Left Arm, Patient Position: Sitting, Cuff Size: Normal)   Pulse 62   Ht 5\' 7"  (1.702 m)   Wt 174 lb 12.8 oz (79.3 kg)   SpO2 98%   BMI 27.38 kg/m  , BMI Body mass index is 27.38 kg/m.     GEN: Well nourished, well developed, in no acute distress. HEENT: normal. Neck: Supple, no JVD, carotid bruits, or masses. Cardiac: RRR, extrasystole is noted , no murmurs, rubs, or gallops. No clubbing, cyanosis, edema.  Radials/DP/PT 2+ and equal bilaterally.  Respiratory:  Respirations regular and unlabored, clear to auscultation bilaterally. GI: Soft, nontender, nondistended, BS + x 4. MS: no deformity or atrophy. Skin: warm and dry, no rash. Neuro:  Strength and sensation are intact. Psych: Normal affect.  EKG Interpretation Date/Time:  Monday March 27 2023 14:45:50 EDT Ventricular Rate:  64 PR Interval:    QRS Duration:  156 QT Interval:  472 QTC Calculation: 486 R Axis:   -41  Text Interpretation: Ventricular-paced rhythm with occasional Premature ventricular complexes When compared with ECG of 27-Mar-2023 14:45, No significant change was found Confirmed by [name] [phone]) on 03/27/2023 3:07:18 PM   Lab Results  Component Value Date   WBC 7.5 01/11/2023   HGB 15.6 (H) 01/11/2023   HCT 46.0 01/11/2023   MCV 93.5 01/11/2023   PLT 133 (L) 01/11/2023   Lab Results  Component Value Date   CREATININE 1.34 (H) 02/06/2023   BUN 26 02/06/2023   NA 139 02/06/2023   K 4.2 02/06/2023   CL 106 02/06/2023   CO2 20  02/06/2023   Lab Results  Component Value Date   ALT 12 01/11/2023   AST 17 01/11/2023   ALKPHOS 111 01/11/2023   BILITOT 1.8 (H) 01/11/2023   Lab Results  Component Value Date   CHOL 109 03/16/2022   HDL 30.20 (L) 03/16/2022   LDLCALC 54 03/16/2022   TRIG 123.0 03/16/2022   CHOLHDL 4 03/16/2022    Lab Results  Component Value Date   HGBA1C 6.3 12/12/2022     Review of Prior Studies EKG Interpretation Date/Time:  Monday March 27 2023 14:45:50 EDT Ventricular Rate:  64 PR Interval:    QRS Duration:  156 QT Interval:  472 QTC Calculation: 486 R Axis:   -41  Text Interpretation: Ventricular-paced rhythm with occasional Premature ventricular complexes When compared with ECG of 27-Mar-2023 14:45, No significant change was found Confirmed by [name] [phone]) on 03/27/2023 3:07:18 PM  Right Heart Cath10/25/2023 Findings:   On milrinone 0.25    RA = 1 RV = 27/1 PA = 32/13 (18) PCW = 2 Fick cardiac output/index = 5.3/2.7 Thermo = 3.8/1.9 PVR = 3.0 (fick) 4.2 (TD) Ao sat = 93% PA sat = 71%, 68%   Given 175 cc NS load   PCW = 5 TD CO/CI = 4.1/2.1   Assessment: 1. Low volume status 2.  With mild to moderately depressed CO on milrinone 3. Mild improvement in hemodynamics with volume loading  Echocardiogram 06/06/2022 1. Left ventricular ejection fraction, by estimation, is 20%. The left  ventricle has severely decreased function. The left ventricle demonstrates  global hypokinesis. There is moderate concentric left ventricular  hypertrophy. Left ventricular diastolic  parameters are indeterminate.   2. Right ventricular systolic function is moderately reduced. The right  ventricular size is normal. There is moderately elevated pulmonary artery  systolic pressure. The estimated right ventricular systolic pressure is  46.1 mmHg.   3. Left atrial size was moderately dilated.   4. Right atrial size was moderately dilated.   5. The mitral valve is abnormal.  Mild to moderate mitral valve  regurgitation. No evidence of mitral stenosis.   6. The tricuspid valve is abnormal. Tricuspid valve regurgitation is  moderate.   7. The aortic valve is tricuspid. There is mild calcification of the  aortic valve. Aortic valve regurgitation is trivial. No aortic stenosis is  present.   8. The inferior vena cava is dilated in size with <50% respiratory  variability, suggesting right atrial pressure of 15 mmHg.   9. The patient is in atrial fibrillation.   Assessment & Plan   1.  Chronic combined systolic diastolic CHF: Echocardiogram 06/03/2022 revealed an EF of 20%.  She is having repeat echocardiogram completed on April 06, 2023 before seeing Dr. Gala [facility].  This is tachycardic mediated cardiomyopathy in etiology.  Today blood pressure is not optimal for reduced EF based on October 2023 echocardiogram.  She denies any dizziness, palpitations, near-syncope, or profound fatigue.  She is not having any dyspnea on exertion or lower extremity edema currently.  She continues to have good response from torsemide 20 mg daily.  Her weight has been fairly stable.  She is avoiding salt.  Most recent BNP on 12/23/2022 was 135.6.  Creatinine 1.34.  GFR 41.  Had right heart cath on 06/08/2022 while on milrinone.  Please see results above.  2.  Persistent atrial fibrillation: Heart rate is controlled today.  She is ventricularly paced with underlying atrial fibrillation on EKG today.  She remains on Xarelto.  She denies any bleeding with bowel movements despite her complaints of chronic constipation.  No changes in her medication regimen at this time.  3.  Hypertension: Blood pressure is not optimal for reduced EF on the basis of most recent echocardiogram in October 2023 with an EF of 20%.  She is on 3 antihypertensive medications to include hydralazine 50 mg 3 times a day, losartan 50 mg daily, metoprolol 100 mg daily, spironolactone 25 mg  daily.  Depending upon echocardiogram, she may be a candidate for Entresto as she is already on ARB.  Creatinine 1.34.  Defer to advanced heart failure clinic to make recommendation concerning this medication.  No changes in her current dosing at this time.  4.  Hyperlipidemia: Most current lab dated 06/09/2022 LP(a) less than 8.4.  She is currently on atorvastatin 10 mg at at bedtime.  Will need follow-up with lipids and LFTs.         Signed, Bettey Mare. Liborio Nixon, ANP, AACC   03/27/2023 3:07 PM      Office [phone] Fax [phone]  Notice: This dictation was prepared with Dragon dictation along with smaller phrase technology. Any transcriptional errors that result from this process are unintentional and may not be corrected upon review.

## 2023-03-27 ENCOUNTER — Telehealth (HOSPITAL_COMMUNITY): Payer: Self-pay

## 2023-03-27 ENCOUNTER — Ambulatory Visit: Payer: Medicare Other | Attending: Adult Health | Admitting: Adult Health

## 2023-03-27 ENCOUNTER — Encounter: Payer: Self-pay | Admitting: Adult Health

## 2023-03-27 VITALS — BP 130/80 | HR 62 | Ht 67.0 in | Wt 174.8 lb

## 2023-03-27 DIAGNOSIS — E785 Hyperlipidemia, unspecified: Secondary | ICD-10-CM | POA: Diagnosis not present

## 2023-03-27 DIAGNOSIS — I10 Essential (primary) hypertension: Secondary | ICD-10-CM

## 2023-03-27 DIAGNOSIS — Z950 Presence of cardiac pacemaker: Secondary | ICD-10-CM | POA: Diagnosis not present

## 2023-03-27 DIAGNOSIS — E1169 Type 2 diabetes mellitus with other specified complication: Secondary | ICD-10-CM | POA: Diagnosis not present

## 2023-03-27 DIAGNOSIS — E7800 Pure hypercholesterolemia, unspecified: Secondary | ICD-10-CM | POA: Diagnosis not present

## 2023-03-27 DIAGNOSIS — G629 Polyneuropathy, unspecified: Secondary | ICD-10-CM | POA: Diagnosis not present

## 2023-03-27 DIAGNOSIS — Z7984 Long term (current) use of oral hypoglycemic drugs: Secondary | ICD-10-CM | POA: Diagnosis not present

## 2023-03-27 DIAGNOSIS — I4819 Other persistent atrial fibrillation: Secondary | ICD-10-CM | POA: Diagnosis not present

## 2023-03-27 DIAGNOSIS — I5042 Chronic combined systolic (congestive) and diastolic (congestive) heart failure: Secondary | ICD-10-CM | POA: Diagnosis not present

## 2023-03-27 NOTE — Patient Instructions (Addendum)
Medication Instructions:  No Changes *If you need a refill on your cardiac medications before your next appointment, please call your pharmacy*   Lab Work: No Labs If you have labs (blood work) drawn today and your tests are completely normal, you will receive your results only by: MyChart Message (if you have MyChart) OR A paper copy in the mail If you have any lab test that is abnormal or we need to change your treatment, we will call you to review the results.   Testing/Procedures: No Testing   Follow-Up: At [facility], you and your health needs are our priority.  As part of our continuing mission to provide you with exceptional heart care, we have created designated Provider Care Teams.  These Care Teams include your primary Cardiologist (physician) and Advanced Practice Providers (APPs -  Physician Assistants and Nurse Practitioners) who all work together to provide you with the care you need, when you need it.  We recommend signing up for the patient portal called "MyChart".  Sign up information is provided on this After Visit Summary.  MyChart is used to connect with patients for Virtual Visits (Telemedicine).  Patients are able to view lab/test results, encounter notes, upcoming appointments, etc.  Non-urgent messages can be sent to your provider as well.   To learn more about what you can do with MyChart, go to [email or web address].    Your next appointment:   1 year  Provider:   Donato Schultz, MD

## 2023-03-27 NOTE — Telephone Encounter (Addendum)
Reached out to pt to f/u-looks like she was having c/p last week and also about her metoprolol.   I called both numbers listed, no answer.  I did LVM for her to return my call.   She did call back, she isnt home to review her pill bottles and the names, she thinking someone told her to cut back on the metoprolol but she isnt sure who it was. I am not able to find a note that reports that change.  She wants to continue conversation tomor. And she said the c/p were more pains around her rib cage area that went thru her back.  She did go to her gen cardiology appoint today for f/u.    Kerry Hough, EMT-Paramedic  [phone] 03/27/2023

## 2023-03-29 ENCOUNTER — Telehealth (HOSPITAL_COMMUNITY): Payer: Self-pay

## 2023-03-29 NOTE — Telephone Encounter (Addendum)
Reached out to [facility] ref her metoprolol med as there is question if she has it or ever got it refilled. [facility] not [facility].  She is continually reporting to dede that she is not taking it but doesn't think she even has it.   Kerry Hough, EMT-Paramedic  [phone] 03/29/2023

## 2023-04-05 ENCOUNTER — Other Ambulatory Visit (HOSPITAL_COMMUNITY): Payer: Self-pay

## 2023-04-06 ENCOUNTER — Encounter (HOSPITAL_COMMUNITY): Payer: Self-pay | Admitting: Internal Medicine

## 2023-04-06 ENCOUNTER — Ambulatory Visit (HOSPITAL_COMMUNITY)
Admission: RE | Admit: 2023-04-06 | Discharge: 2023-04-06 | Disposition: A | Discharge status: Home / Self Care | Payer: Medicare Other | Source: Ambulatory Visit | Attending: Internal Medicine | Admitting: Internal Medicine

## 2023-04-06 VITALS — BP 140/80 | HR 60 | Wt 171.2 lb

## 2023-04-06 DIAGNOSIS — I5022 Chronic systolic (congestive) heart failure: Secondary | ICD-10-CM

## 2023-04-06 DIAGNOSIS — Z87891 Personal history of nicotine dependence: Secondary | ICD-10-CM | POA: Diagnosis not present

## 2023-04-06 DIAGNOSIS — I2510 Atherosclerotic heart disease of native coronary artery without angina pectoris: Secondary | ICD-10-CM

## 2023-04-06 DIAGNOSIS — N1832 Chronic kidney disease, stage 3b: Secondary | ICD-10-CM | POA: Diagnosis not present

## 2023-04-06 DIAGNOSIS — I130 Hypertensive heart and chronic kidney disease with heart failure and stage 1 through stage 4 chronic kidney disease, or unspecified chronic kidney disease: Secondary | ICD-10-CM | POA: Diagnosis not present

## 2023-04-06 DIAGNOSIS — Z951 Presence of aortocoronary bypass graft: Secondary | ICD-10-CM | POA: Diagnosis not present

## 2023-04-06 DIAGNOSIS — I5042 Chronic combined systolic (congestive) and diastolic (congestive) heart failure: Secondary | ICD-10-CM

## 2023-04-06 DIAGNOSIS — I4819 Other persistent atrial fibrillation: Secondary | ICD-10-CM

## 2023-04-06 DIAGNOSIS — J449 Chronic obstructive pulmonary disease, unspecified: Secondary | ICD-10-CM | POA: Diagnosis not present

## 2023-04-06 DIAGNOSIS — I4821 Permanent atrial fibrillation: Secondary | ICD-10-CM | POA: Insufficient documentation

## 2023-04-06 DIAGNOSIS — E785 Hyperlipidemia, unspecified: Secondary | ICD-10-CM | POA: Diagnosis not present

## 2023-04-06 DIAGNOSIS — I340 Nonrheumatic mitral (valve) insufficiency: Secondary | ICD-10-CM | POA: Insufficient documentation

## 2023-04-06 DIAGNOSIS — I4891 Unspecified atrial fibrillation: Secondary | ICD-10-CM | POA: Diagnosis present

## 2023-04-06 LAB — BASIC METABOLIC PANEL
Anion gap: 11 (ref 5–15)
BUN: 28 mg/dL — ABNORMAL HIGH (ref 8–23)
CO2: 23 mmol/L (ref 22–32)
Calcium: 9.3 mg/dL (ref 8.9–10.3)
Chloride: 106 mmol/L (ref 98–111)
Creatinine, Ser: 1.4 mg/dL — ABNORMAL HIGH (ref 0.44–1.00)
GFR, Estimated: 39 mL/min — ABNORMAL LOW (ref >60)
Glucose, Bld: 123 mg/dL — ABNORMAL HIGH (ref 70–99)
Potassium: 3.5 mmol/L (ref 3.5–5.1)
Sodium: 140 mmol/L (ref 135–145)

## 2023-04-06 LAB — ECHOCARDIOGRAM COMPLETE
Area-P 1/2: 2.33 cm2
S' Lateral: 4.7 cm

## 2023-04-06 LAB — BRAIN NATRIURETIC PEPTIDE: B Natriuretic Peptide: 184.3 pg/mL — ABNORMAL HIGH (ref 0.0–100.0)

## 2023-04-06 MED ORDER — LOSARTAN POTASSIUM 100 MG PO TABS: 100.0000 mg | ORAL_TABLET | Freq: Every day | ORAL | 3 refills | Status: DC

## 2023-04-06 NOTE — Progress Notes (Signed)
Advanced Heart Failure Clinic Note  PCP: [name] Grandchild, MD Primary Cardiologist: Dr. Anne Fu  EP: Dr Ladona Ridgel HF MD: Dr Gala Romney   HPI: [name] [name] is a 77 y.o. female with h/o HTN, former tobacco abuse, permanent AF, CAD s/p CABG x 2 (LIMA to LAD and SVG to OM) in 2016, Chronic combined HF, Ischemic CM, HLD, and Biotronik PPM.   She was initially noted to be in atrial fibrillation in 2016 with a reduced LVEF, leading to ischemic workup with a cardiac cath showing severe multivessel CAD with LM dz -> CABG x2 utilizing LIMA to LAD, SVG to OM.   Admitted 1/19 for AF with RVR in setting of Influenza A. EP was consulted to discuss plans for Tikosyn vs. ablation vs. medication rate control however, due to her known medication noncompliance she was found to not be an ablation nor antiarrhythmic candidate   2019 back in Afib. She was started on amiodarone & Entresto.  She underwent successful atrial flutter TEE/DCCV on 12/25/17.  Later went back in A fib.   Admitted 3/21 for iron-def anemia. Hgb 5.4 EGD/colonoscopy which were unremarkable and capsule endoscopy which showed duodenal ulcer with bleeding and stigmata.  GI recommended oral Protonix and outpatient follow-up.  Admitted 6/22 for AV node ablation and PPM due to uncontrolled heart rates with A fib. Underwent Biotronix single lead PPM. Toprol XL and digoxin stopped. Carvedilol 25 mg bid started. She was discharged 01/19/21.    Admitted 02/10/21 with a/c systolic heart failure. Diuresed with IV lasix and transitioned to torsemide 40 mg daily. EF 25-30% with severe MR/TR.    Admitted 10/22 for COPD exacerbation with a component of A/C CHF, ran out of torsemide several days prior  Echo 07/30/21: EF 25-30% Moderate RV dysfunction. Severe biatrial enlargement. Severe central MR/TR.   Was seen at [facility] in 4/23 for second opinion. Restarted Entresto 24/26 bid. Did not tolerate due to CP. Agreed she might candidate for  mTEER down the road.   She was seen in the ED  June, July, August, and twice in September 2023 for chest pain/shortness of breath.    Followed by EP for device upgrade. She was hesitant to pursue any procedures. Possible CRT discussed.   Admitted 10/23 with a/c HF, worrisome for low output. Echo showed EF 20%, severe LV dysfunction, RV moderately down.  Diuresed with IV lasix and started on milrinone. RHC showed low volume and CI 1.9 on milrinone, improved with small IVF bolus. Drips weaned and GDMT added back. BiDil increased with elevated BP. Referred back to Paramedicine to help with meds. Discharged home, weight 183 lbs.  EP attempted to upgrade her PPM to ICD on 04/08 but she was found to have occlusion of left subclavian vein and procedure was aborted. Saw Dr. Ladona Ridgel 04/25 and offered lead extraction and insertion of BiV ICD with removal of pacing lead and insertion of CS lead, left bundle area lead and ICD lead. She was not interested. She appeared volume overloaded and Torsemide was increased.   Admitted 4/24 with a/c HF, 2/2 medical non-adherence. Diuresed with IV lasix. Echo showed EF 25%, RV moderately reduced, RVSP 55 mmHg, severe BAE, moderate MR. GDMT titrated, and she was discharged home, weight 171 lbs.  Today she returns for HF follow up with [name] with paramedicine. Feeling good. Does ADLs without a problem. Ni edema, orthopnea  or PND. No CP.    Echo today EF 30-35%   Cardiac Studies   - Echo (4/24): EF 25%, RV moderately down, moderate MR  - RHC (10/23): on milrinone 0.25 RA = 1 RV = 27/1 PA = 32/13 (18) PCW = 2 Fick cardiac output/index = 5.3/2.7 Thermo = 3.8/1.9 PVR = 3.0 (fick) 4.2 (TD) Ao sat = 93% PA sat = 71%, 68%   Given 175 cc NS load   PCW = 5 TD CO/CI = 4.1/2.1   1. Low volume status 2. With mild to moderately depressed CO on milrinone 3. Mild improvement in hemodynamics with volume loading    - Echo (10/23): EF 20%, severe LV dysfunction, RV  moderately down - Echo (4/22): EF 25-30% RV moderately reduced  - Echo (11/21): EF 30-35% RV normal  - Echo (4/21): EF 55-60%  - Stress Test (1/20): Low Risk EF 44% - Echo (12/19): EF 30-35% RV mildly dilated - Echo (5/18): EF stable 50-55% - Echo (3/16): EF 35-40%.  ROS: All systems negative except as listed in HPI, PMH and Problem List.  SH:  Social History   Socioeconomic History   Marital status: Widowed    Spouse name: Not on file   Number of children: 1   Years of education: 26   Highest education level: Not on file  Occupational History   Occupation: RETIRED [employer]  Tobacco Use   Smoking status: Former    Current packs/day: 0.00    Average packs/day: 0.5 packs/day for 56.0 years (28.0 ttl pk-yrs)    Types: Cigarettes    Start date: 11/13/1965    Quit date: 11/13/2021    Years since quitting: 1.3   Smokeless tobacco: Never   Tobacco comments:    smoking 2 cigarettes/day as of 10/23/20  Vaping Use   Vaping status: Never Used  Substance and Sexual Activity   Alcohol use: No    Alcohol/week: 0.0 standard drinks of alcohol   Drug use: No   Sexual activity: Not on file  Other Topics Concern   Not on file  Social History Narrative   Patient reports it being difficult to pay for everything due to outstanding medical bills from hospital stay last year but states she is able to keep up with basic expenses.  Patient does have some concerns with her house's condition due to a water leak- had her roof repaired last month but has some leaking in the front which has affected her porch.  Patient owns her own car and is able to drive herself but has some concerns about the reliability of her car- has gotten taxi to the clinic for appointment in the past when her car broke down.   Social Determinants of Health   Financial Resource Strain: Low Risk  (06/07/2018)   Overall Financial Resource Strain (CARDIA)    Difficulty of Paying Living Expenses: Not hard at all  Recent  Concern: Financial Resource Strain - Medium Risk (05/21/2018)   Overall Financial Resource Strain (CARDIA)    Difficulty of Paying Living Expenses: Somewhat hard  Food Insecurity: No Food Insecurity (12/15/2022)   Hunger Vital Sign    Worried About Running Out of Food in the Last Year: Never true    Ran Out of Food in the Last Year: Never true  Transportation Needs: No Transportation Needs (12/15/2022)   PRAPARE - Administrator, Civil Service (Medical): No    Lack of Transportation (Non-Medical): No  Physical Activity: Not on  file  Stress: Stress Concern Present (02/22/2023)   [employer] of Occupational Health - Occupational Stress Questionnaire    Feeling of Stress : To some extent  Social Connections: Not on file  Intimate Partner Violence: Not At Risk (06/09/2022)   Humiliation, Afraid, Rape, and Kick questionnaire    Fear of Current or Ex-Partner: No    Emotionally Abused: No    Physically Abused: No    Sexually Abused: No   FH:  Family History  Problem Relation Age of Onset   Cancer Mother        LYMPHOMA   Heart disease Father    Other Father        TB   CVA Sister    Prostate cancer Brother 8   Diabetes Brother    Past Medical History:  Diagnosis Date   (HFimpEF) heart failure with improved ejection fraction (HCC)    a. 07/2018 Echo: EF 30-35%; b. 11/2019 Echo: EF 55-60%, no rwma, Gr2 DD, Nl RV size/fxn. Mild BAE. Mild MR/AI.   Arthritis    Asthma    Atopic dermatitis    CAD (coronary artery disease)    a. 2016 s/p CABG x 2 (LIMA->LAD, VG->OM); b. 08/2018 MV: EF 44%, no ischemia/infact.   Cardiac arrest (HCC) 10/2014   Cardiomyopathy, ischemic    a. 07/2018 Echo: EF 30-35%; 11/2019 Echo: EF 55-60%.   Carotid arterial disease (HCC)    a. 11/2019 Carotid U/S   CHF (congestive heart failure) (HCC)    CKD (chronic kidney disease), stage III (HCC)    COPD (chronic obstructive pulmonary disease) (HCC)    Diabetes mellitus without complication (HCC)     Dysrhythmia    Esophageal dilatation 2013   GERD (gastroesophageal reflux disease)    Glaucoma    Gout    Headache    Hypertension    Hypokalemia    Idiopathic angioedema    LGI bleed 08/06/2017   a. felt to be hemorrhoidal during that admission (no drop in Hgb).   Lower back pain    Paroxysmal atrial fibrillation (HCC)    a. Dx 2016-->h/o difficult to control rates (complicated by noncompliance), not felt to be a candidate for ablation or antiarrhythmic due to noncompliance; b. Recurrent AF 2021 - converted w/ IV dilt; c. CHA2DS2VASc = 7-->Xarelto.   Personal history of noncompliance with medical treatment, presenting hazards to health    Prediabetes    Presence of permanent cardiac pacemaker    S/P CABG x 2 with clipping of LA appendage 11/14/2014   LIMA to LAD, SVG to OM, EVH via right thigh   Urine incontinence    Uterine fibroid    Current Outpatient Medications  Medication Sig Dispense Refill   acetaminophen (TYLENOL) 325 MG tablet Take 2 tablets (650 mg total) by mouth every 4 (four) hours as needed for headache or mild pain.     allopurinol (ZYLOPRIM) 100 MG tablet Take 100 mg by mouth daily.     atorvastatin (LIPITOR) 10 MG tablet TAKE 1 TABLET BY MOUTH EVERYDAY AT BEDTIME 90 tablet 0   gabapentin (NEURONTIN) 100 MG capsule Take 1 capsule (100 mg total) by mouth at bedtime. 90 capsule 0   hydrALAZINE (APRESOLINE) 25 MG tablet TAKE 2 TABLETS (50 MG TOTAL) BY MOUTH 3 (THREE) TIMES DAILY. 270 tablet 3   losartan (COZAAR) 50 MG tablet Take 0.5 tablets (25 mg total) by mouth daily. 30 tablet 6   metFORMIN (GLUCOPHAGE) 500 MG tablet Take 1 tablet (500 mg  total) by mouth daily with breakfast. 90 tablet 0   Naphazoline HCl (CLEAR EYES OP) Place 1 drop into both eyes daily as needed (irritation).     ONETOUCH ULTRA test strip 2 (two) times daily. as directed     pantoprazole (PROTONIX) 40 MG tablet TAKE 1 TABLET BY MOUTH EVERY DAY 90 tablet 0   Rivaroxaban (XARELTO) 15 MG TABS  tablet Take 1 tablet (15 mg total) by mouth daily with supper. 30 tablet 8   spironolactone (ALDACTONE) 25 MG tablet Take 1 tablet (25 mg total) by mouth daily. 30 tablet 11   torsemide (DEMADEX) 20 MG tablet Take 1 tablet (20 mg total) by mouth daily. 90 tablet 3   TRELEGY ELLIPTA 100-62.5-25 MCG/ACT AEPB Inhale ONE PUFF into THE lungs daily 120 each 1   metoprolol succinate (TOPROL-XL) 100 MG 24 hr tablet Take 100 mg by mouth daily. (Patient not taking: Reported on 04/06/2023)     No current facility-administered medications for this encounter.   BP (!) 140/80   Pulse 60   Wt 77.7 kg (171 lb 3.2 oz)   SpO2 97%   BMI 26.81 kg/m   Wt Readings from Last 3 Encounters:  04/06/23 77.7 kg (171 lb 3.2 oz)  03/27/23 79.3 kg (174 lb 12.8 oz)  03/16/23 77.6 kg (171 lb)   PHYSICAL EXAM: General:  Well appearing. No resp difficulty HEENT: normal Neck: supple. no JVD. Carotids 2+ bilat; no bruits. No lymphadenopathy or thryomegaly appreciated. Cor: PMI nondisplaced. Regular rate & rhythm. No rubs, gallops or murmurs. Lungs: clear Abdomen: soft, nontender, nondistended. No hepatosplenomegaly. No bruits or masses. Good bowel sounds. Extremities: no cyanosis, clubbing, rash, edema Neuro: alert & orientedx3, cranial nerves grossly intact. moves all 4 extremities w/o difficulty. Affect pleasant   ASSESSMENT & PLAN:  1. Chronic Systolic Heart Failure - Suspect Combined ICM /NICM with previous A fib RVR. RV Pacing? - In 2021 EF improved 55-60% but over the last year EF has been down 25-30 % with severe MR/TR - Echo (10/23): EF 20%, moderate LVH, RV moderately reduced, RVSP 46 mmHg, moderate BAE, mild to moderate MR, moderate TR, - Echo (4/24): EF 25%, RV moderately reduced, RVSP 55 mmHg, severe BAE, moderate MR - Echo today 04/06/23 EF 30-35% severe BAE, moderate MR - NYHA II-early III, volume ok. ReDs 34% - Continue torsemide  40 mg daily - Continue spironolactone 25 mg daily. - Increase  losartan 50 mg daily -> 100 mg daily (failed Entresto with chest pain).  - Continue hydralazine 50 mg tid, refuses Imdur d/t headaches. - No SGLT2i due UTI/yeast infections.  - No beta blocker with recent acute exacerbation and high burden of RV pacing on device checks (61% last check 3/24) - Would likely benfit from upgrade to CRT. Upgrade PPM to ICD aborted earlier this month d/t occlusion of left subclavian vein. Dr. Ladona Ridgel saw patient and offered lead extraction and insertion of CRT which she declined. - I think we have her as good as we can get her. Her care has been difficult due to limited insight into her HF and refusal of many suggestions. Will continue Paramedicine support to help with compliance and close monitoring.   2. MR, Previously Severe - Moderate on echo today   3. Permanent A fib  - Biotronik dual chamber PPM/AV nodal ablation 2022.  - See above regarding device - Rate controlled.  - Continue Xarelto 15 mg daily (adjusted for CrCl). No bleeding issues. - Labs today   4.  CAD - s/p CABG x2 2016 (LIMA to LAD and SVG to OM)  - No s/s angina  - No ASA given AC - Continue statin.   5. COPD - Discussed smoking cessation. - Not smoking daily.   6. Hx PVCs - During prior admit, on milrinone. - Asymptomatic. - None on ECG today - Labs today.  7. CKD IIIb - Baseline SCr 1.2-1.4 - labs today  8. Hypertension - Meds as above   Arvilla Meres, MD  2:16 PM  04/06/23

## 2023-04-06 NOTE — Patient Instructions (Signed)
INCREASE Losartan to 100 mg daily.  Labs done today, your results will be available in MyChart, we will contact you for abnormal readings.  Your physician recommends that you schedule a follow-up appointment in: 3 months  If you have any questions or concerns before your next appointment please send [place] a message through [facility] or call our office at [phone].    TO LEAVE A MESSAGE FOR THE NURSE SELECT OPTION 2, PLEASE LEAVE A MESSAGE INCLUDING: YOUR NAME DATE OF BIRTH CALL BACK NUMBER REASON FOR CALL**this is important as we prioritize the call backs  YOU WILL RECEIVE A CALL BACK THE SAME DAY AS LONG AS YOU CALL BEFORE 4:00 PM  At the [facility], you and your health needs are our priority. As part of our continuing mission to provide you with exceptional heart care, we have created designated Provider Care Teams. These Care Teams include your primary Cardiologist (physician) and Advanced Practice Providers (APPs- Physician Assistants and Nurse Practitioners) who all work together to provide you with the care you need, when you need it.   You may see any of the following providers on your designated Care Team at your next follow up: Dr Arvilla Meres Dr Marca Ancona Dr. Marcos Eke, NP Robbie Lis, [place] [facility] [place], [place] Brynda Peon, NP Karle Plumber, PharmD   Please be sure to bring in all your medications bottles to every appointment.    Thank you for choosing [facility]

## 2023-04-14 ENCOUNTER — Other Ambulatory Visit (HOSPITAL_COMMUNITY): Payer: Self-pay | Admitting: Emergency Medicine

## 2023-04-14 NOTE — Progress Notes (Signed)
Paramedicine Encounter    Patient ID: [name] [name], female    DOB: 03-15-1946, 77 y.o.   MRN: [id number]   Complaints NONE  Assessment:  A&O x 4, skin W&D w/ good color.  Lung sounds clear throughout and no peripheral edema noted.  Compliance with meds Pt states, "I'm taking all my meds"  Pill box filled n/a  Refills needed NONE  Meds changes since last visit:  Losartan increased to 100mg . Daily.    Social changes NONE   BP 120/70 (BP Location: Left Arm, Patient Position: Sitting, Cuff Size: Normal)   Pulse 82   Wt 171 lb 9.6 oz (77.8 kg)   SpO2 96%   BMI 26.88 kg/m   CBG 135 Weight yesterday-not taken Last visit weight-171lb  [name] [name] reports to be feeling well today today.  She tells me, "I've been taking my meds like I'm supposed to."  I reviewed each med individually with her and had her tell me the dosages and she did so correctly except she has not increased her Losartan to 100mg  as requested by Dr. Gala Romney on 8/22.  I reviewed this with her again and instructed her she is to take 2 50mg  tablets daily.  She verbalizes she understands this.  I requested for her to again allow me to set up a pill box and she continues to refuse. She tells me today that her sister who lives in [place] is very sick with cancer.  She is contemplating going to [place] to help take care of her sister and says that her prognosis does not look good and she is worried about what she should do.  She advised she would keep me apprised if she decides to go to [place] for a while so we can make arrangements for her meds.  ACTION: Home visit completed  [name] [phone] 04/14/23  Patient Care Team: [name], MD as PCP - General (Internal Medicine) [name], MD as PCP - Cardiology (Cardiology) [name], MD as PCP - Electrophysiology (Cardiology) Bensimhon, [name], MD as PCP - Advanced Heart Failure (Cardiology) Chrystie Nose, MD as Consulting  Physician (Cardiology)  Patient Active Problem List   Diagnosis Date Noted   Dysuria 12/29/2022   Cough productive of purulent sputum 12/29/2022   Type 2 diabetes mellitus with hyperlipidemia (HCC) 12/14/2022   Acute cough 12/12/2022   Ceruminosis, bilateral 06/17/2022   Flu vaccine need 06/17/2022   Chronic combined systolic and diastolic CHF (congestive heart failure) (HCC) 06/17/2022   Encounter for general adult medical examination with abnormal findings 06/16/2022   Chronic anticoagulation 06/06/2022   Mitral regurgitation 06/06/2022   Controlled type 2 diabetes mellitus without complication, without long-term current use of insulin (HCC) 06/06/2022   Pacemaker 02/22/2022   Hypertensive urgency 06/08/2021   Obstructive sleep apnea 09/21/2020   Obesity (BMI 30.0-34.9) 02/10/2020   COPD (chronic obstructive pulmonary disease) (HCC) 10/26/2019   Thrombocytopenia (HCC) 09/14/2017   Medication noncompliance due to cognitive impairment 09/06/2017   Persistent atrial fibrillation (HCC) 08/04/2017   Cardiomyopathy, ischemic 02/08/2017   CKD (chronic kidney disease), stage III (HCC) 01/30/2017   Coronary artery disease due to lipid rich plaque    Essential hypertension     Current Outpatient Medications:    acetaminophen (TYLENOL) 325 MG tablet, Take 2 tablets (650 mg total) by mouth every 4 (four) hours as needed for headache or mild pain., Disp: , Rfl:    allopurinol (ZYLOPRIM) 100 MG tablet,  Take 100 mg by mouth daily., Disp: , Rfl:    atorvastatin (LIPITOR) 10 MG tablet, TAKE 1 TABLET BY MOUTH EVERYDAY AT BEDTIME, Disp: 90 tablet, Rfl: 0   gabapentin (NEURONTIN) 100 MG capsule, Take 1 capsule (100 mg total) by mouth at bedtime., Disp: 90 capsule, Rfl: 0   hydrALAZINE (APRESOLINE) 25 MG tablet, TAKE 2 TABLETS (50 MG TOTAL) BY MOUTH 3 (THREE) TIMES DAILY., Disp: 270 tablet, Rfl: 3   losartan (COZAAR) 100 MG tablet, Take 1 tablet (100 mg total) by mouth daily., Disp: 90 tablet, Rfl:  3   metFORMIN (GLUCOPHAGE) 500 MG tablet, Take 1 tablet (500 mg total) by mouth daily with breakfast., Disp: 90 tablet, Rfl: 0   Naphazoline HCl (CLEAR EYES OP), Place 1 drop into both eyes daily as needed (irritation)., Disp: , Rfl:    ONETOUCH ULTRA test strip, 2 (two) times daily. as directed, Disp: , Rfl:    pantoprazole (PROTONIX) 40 MG tablet, TAKE 1 TABLET BY MOUTH EVERY DAY, Disp: 90 tablet, Rfl: 0   Rivaroxaban (XARELTO) 15 MG TABS tablet, Take 1 tablet (15 mg total) by mouth daily with supper., Disp: 30 tablet, Rfl: 8   spironolactone (ALDACTONE) 25 MG tablet, Take 1 tablet (25 mg total) by mouth daily., Disp: 30 tablet, Rfl: 11   torsemide (DEMADEX) 20 MG tablet, Take 1 tablet (20 mg total) by mouth daily., Disp: 90 tablet, Rfl: 3   TRELEGY ELLIPTA 100-62.5-25 MCG/ACT AEPB, Inhale ONE PUFF into THE lungs daily, Disp: 120 each, Rfl: 1   metoprolol succinate (TOPROL-XL) 100 MG 24 hr tablet, Take 100 mg by mouth daily. (Patient not taking: Reported on 04/06/2023), Disp: , Rfl:  Allergies  Allergen Reactions   Bee Venom Anaphylaxis   Ivp Dye [Iodinated Contrast Media] Anaphylaxis   Ace Inhibitors     angioedema   Atenolol     severe headaches   Codeine Nausea And Vomiting   Entresto [Sacubitril-Valsartan] Other (See Comments)    Chest pain    Isosorbide     Severe headaches   Shrimp [Shellfish Allergy] Swelling   Simvastatin     not sure   Jardiance [Empagliflozin] Other (See Comments)    Caused Boils    Penicillin G Rash     Social History   Socioeconomic History   Marital status: Widowed    Spouse name: Not on file   Number of children: 1   Years of education: 70   Highest education level: Not on file  Occupational History   Occupation: RETIRED [employer]  Tobacco Use   Smoking status: Former    Current packs/day: 0.00    Average packs/day: 0.5 packs/day for 56.0 years (28.0 ttl pk-yrs)    Types: Cigarettes    Start date: 11/13/1965    Quit date:  11/13/2021    Years since quitting: 1.4   Smokeless tobacco: Never   Tobacco comments:    smoking 2 cigarettes/day as of 10/23/20  Vaping Use   Vaping status: Never Used  Substance and Sexual Activity   Alcohol use: No    Alcohol/week: 0.0 standard drinks of alcohol   Drug use: No   Sexual activity: Not on file  Other Topics Concern   Not on file  Social History Narrative   Patient reports it being difficult to pay for everything due to outstanding medical bills from hospital stay last year but states she is able to keep up with basic expenses.  Patient does have some concerns with her  house's condition due to a water leak- had her roof repaired last month but has some leaking in the front which has affected her porch.  Patient owns her own car and is able to drive herself but has some concerns about the reliability of her car- has gotten taxi to the clinic for appointment in the past when her car broke down.   Social Determinants of Health   Financial Resource Strain: Low Risk  (06/07/2018)   Overall Financial Resource Strain (CARDIA)    Difficulty of Paying Living Expenses: Not hard at all  Recent Concern: Financial Resource Strain - Medium Risk (05/21/2018)   Overall Financial Resource Strain (CARDIA)    Difficulty of Paying Living Expenses: Somewhat hard  Food Insecurity: No Food Insecurity (12/15/2022)   Hunger Vital Sign    Worried About Running Out of Food in the Last Year: Never true    Ran Out of Food in the Last Year: Never true  Transportation Needs: No Transportation Needs (12/15/2022)   PRAPARE - Administrator, Civil Service (Medical): No    Lack of Transportation (Non-Medical): No  Physical Activity: Not on file  Stress: Stress Concern Present (02/22/2023)   [employer] of Occupational Health - Occupational Stress Questionnaire    Feeling of Stress : To some extent  Social Connections: Not on file  Intimate Partner Violence: Not At Risk (06/09/2022)    Humiliation, Afraid, Rape, and Kick questionnaire    Fear of Current or Ex-Partner: No    Emotionally Abused: No    Physically Abused: No    Sexually Abused: No    Physical Exam      Future Appointments  Date Time Provider Department Center  04/18/2023  7:05 AM [facility] DEVICE REMOTES [facility]  07/07/2023  1:30 PM [facility] PA/NP [facility] None  07/18/2023  7:00 AM [facility] DEVICE REMOTES [facility]  10/17/2023  7:00 AM [facility] DEVICE REMOTES [facility]

## 2023-04-18 ENCOUNTER — Ambulatory Visit (INDEPENDENT_AMBULATORY_CARE_PROVIDER_SITE_OTHER): Payer: Medicare Other

## 2023-04-18 DIAGNOSIS — I5042 Chronic combined systolic (congestive) and diastolic (congestive) heart failure: Secondary | ICD-10-CM

## 2023-04-18 DIAGNOSIS — I4819 Other persistent atrial fibrillation: Secondary | ICD-10-CM | POA: Diagnosis not present

## 2023-04-18 LAB — CUP PACEART REMOTE DEVICE CHECK
Date Time Interrogation Session: 20240903161743
Implantable Lead Connection Status: 753985
Implantable Lead Implant Date: 20220606
Implantable Lead Location: 753860
Implantable Lead Model: 377169
Implantable Lead Serial Number: 8000396500
Implantable Pulse Generator Implant Date: 20220606
Pulse Gen Model: 407145
Pulse Gen Serial Number: 70105656

## 2023-04-25 ENCOUNTER — Ambulatory Visit (HOSPITAL_COMMUNITY)
Admission: EM | Admit: 2023-04-25 | Discharge: 2023-04-25 | Disposition: A | Discharge status: Home / Self Care | Payer: Medicare Other | Attending: Family Medicine | Admitting: Family Medicine

## 2023-04-25 ENCOUNTER — Encounter (HOSPITAL_COMMUNITY): Payer: Self-pay

## 2023-04-25 ENCOUNTER — Other Ambulatory Visit: Payer: Self-pay

## 2023-04-25 DIAGNOSIS — R072 Precordial pain: Secondary | ICD-10-CM

## 2023-04-25 NOTE — ED Provider Notes (Signed)
[facility]    CSN: [id number] Arrival date & time: 04/25/23  1348      History   Chief Complaint Chief Complaint  Patient presents with   Pacemaker Problem   Arm Pain    HPI [name] [name] is a 77 y.o. female.    Arm Pain  Here for chest pain that is been going on since yesterday.  She notes that around her pacemaker in her left anterior chest.  She is also having left upper back pain.  For the last 2 weeks she has been having some left arm pain that she feels is more muscular.  No fever or cough  Her past medical history includes coronary artery disease, A-fib, and CKD.  Past Medical History:  Diagnosis Date   (HFimpEF) heart failure with improved ejection fraction (HCC)    a. 07/2018 Echo: EF 30-35%; b. 11/2019 Echo: EF 55-60%, no rwma, Gr2 DD, Nl RV size/fxn. Mild BAE. Mild MR/AI.   Arthritis    Asthma    Atopic dermatitis    CAD (coronary artery disease)    a. 2016 s/p CABG x 2 (LIMA->LAD, VG->OM); b. 08/2018 MV: EF 44%, no ischemia/infact.   Cardiac arrest (HCC) 10/2014   Cardiomyopathy, ischemic    a. 07/2018 Echo: EF 30-35%; 11/2019 Echo: EF 55-60%.   Carotid arterial disease (HCC)    a. 11/2019 Carotid U/S   CHF (congestive heart failure) (HCC)    CKD (chronic kidney disease), stage III (HCC)    COPD (chronic obstructive pulmonary disease) (HCC)    Diabetes mellitus without complication (HCC)    Dysrhythmia    Esophageal dilatation 2013   GERD (gastroesophageal reflux disease)    Glaucoma    Gout    Headache    Hypertension    Hypokalemia    Idiopathic angioedema    LGI bleed 08/06/2017   a. felt to be hemorrhoidal during that admission (no drop in Hgb).   Lower back pain    Paroxysmal atrial fibrillation (HCC)    a. Dx 2016-->h/o difficult to control rates (complicated by noncompliance), not felt to be a candidate for ablation or antiarrhythmic due to noncompliance; b. Recurrent AF 2021 - converted w/ IV dilt; c. CHA2DS2VASc = 7-->Xarelto.    Personal history of noncompliance with medical treatment, presenting hazards to health    Prediabetes    Presence of permanent cardiac pacemaker    S/P CABG x 2 with clipping of LA appendage 11/14/2014   LIMA to LAD, SVG to OM, EVH via right thigh   Urine incontinence    Uterine fibroid     Patient Active Problem List   Diagnosis Date Noted   Dysuria 12/29/2022   Cough productive of purulent sputum 12/29/2022   Type 2 diabetes mellitus with hyperlipidemia (HCC) 12/14/2022   Acute cough 12/12/2022   Ceruminosis, bilateral 06/17/2022   Flu vaccine need 06/17/2022   Chronic combined systolic and diastolic CHF (congestive heart failure) (HCC) 06/17/2022   Encounter for general adult medical examination with abnormal findings 06/16/2022   Chronic anticoagulation 06/06/2022   Mitral regurgitation 06/06/2022   Controlled type 2 diabetes mellitus without complication, without long-term current use of insulin (HCC) 06/06/2022   Pacemaker 02/22/2022   Hypertensive urgency 06/08/2021   Obstructive sleep apnea 09/21/2020   Obesity (BMI 30.0-34.9) 02/10/2020   COPD (chronic obstructive pulmonary disease) (HCC) 10/26/2019   Thrombocytopenia (HCC) 09/14/2017   Medication noncompliance due to cognitive impairment 09/06/2017   Persistent atrial fibrillation (HCC) 08/04/2017  Cardiomyopathy, ischemic 02/08/2017   CKD (chronic kidney disease), stage III (HCC) 01/30/2017   Coronary artery disease due to lipid rich plaque    Essential hypertension     Past Surgical History:  Procedure Laterality Date   AV NODE ABLATION N/A 01/18/2021   Procedure: AV NODE ABLATION;  Surgeon: [name], MD;  [facility];  Service: Cardiovascular;  Laterality: N/A;   BALLOON DILATION N/A 10/10/2019   Procedure: BALLOON DILATION;  Surgeon: Kathi Der, MD;  [facility];  Service: Gastroenterology;  Laterality: N/A;   BIOPSY  10/10/2019   Procedure: BIOPSY;  Surgeon:  Kathi Der, MD;  [facility];  Service: Gastroenterology;;   BIOPSY  10/11/2019   Procedure: BIOPSY;  Surgeon: Kathi Der, MD;  [facility];  Service: Gastroenterology;;   Earnstine Regal N/A 11/21/2022   Procedure: BIVI  ICD UPGRADE;  Surgeon: [name], MD;  Location: [facility] INVASIVE CV LAB;  Service: Cardiovascular;  Laterality: N/A;   CARDIOVERSION N/A 11/18/2014   Procedure: CARDIOVERSION;  Surgeon: Chrystie Nose, MD;  [facility];  Service: Cardiovascular;  Laterality: N/A;   CARDIOVERSION N/A 12/25/2017   Procedure: CARDIOVERSION;  Surgeon: Dolores Patty, MD;  Location: [facility] ENDOSCOPY;  Service: Cardiovascular;  Laterality: N/A;   CLIPPING OF ATRIAL APPENDAGE N/A 11/14/2014   Procedure: CLIPPING OF ATRIAL APPENDAGE;  Surgeon: Purcell Nails, MD;  [facility];  Service: Open Heart Surgery;  Laterality: N/A;   COLONOSCOPY  2013   COLONOSCOPY WITH PROPOFOL N/A 10/11/2019   Procedure: COLONOSCOPY WITH PROPOFOL;  Surgeon: Kathi Der, MD;  [facility];  Service: Gastroenterology;  Laterality: N/A;   CORONARY ARTERY BYPASS GRAFT N/A 11/14/2014   Procedure: CORONARY ARTERY BYPASS GRAFTING (CABG)TIMES 2 USING LEFT INTERNAL MAMMARY ARTERY AND RIGHT SAPHENOUS VEIN HARVESTED ENDOSCOPICALLY;  Surgeon: Purcell Nails, MD;  [facility];  Service: Open Heart Surgery;  Laterality: N/A;   ESOPHAGOGASTRODUODENOSCOPY (EGD) WITH PROPOFOL N/A 10/10/2019   Procedure: ESOPHAGOGASTRODUODENOSCOPY (EGD) WITH PROPOFOL;  Surgeon: Kathi Der, MD;  [facility];  Service: Gastroenterology;  Laterality: N/A;   ESOPHAGOGASTRODUODENOSCOPY (EGD) WITH PROPOFOL N/A 10/27/2019   Procedure: ESOPHAGOGASTRODUODENOSCOPY (EGD) WITH PROPOFOL;  Surgeon: Kerin Salen, MD;  Location: [facility] ENDOSCOPY;  Service: Gastroenterology;  Laterality: N/A;   GIVENS CAPSULE STUDY N/A 10/27/2019   Procedure: GIVENS CAPSULE STUDY;  Surgeon: Kerin Salen, MD;   Location: [facility] ENDOSCOPY;  Service: Gastroenterology;  Laterality: N/A;   INSERT / REPLACE / REMOVE PACEMAKER     LEFT HEART CATHETERIZATION WITH CORONARY ANGIOGRAM N/A 11/04/2014   Procedure: LEFT HEART CATHETERIZATION WITH CORONARY ANGIOGRAM;  Surgeon: Lennette Bihari, MD;  Location: [facility] CATH LAB;  Service: Cardiovascular;  Laterality: N/A;   PACEMAKER IMPLANT N/A 01/18/2021   Procedure: PACEMAKER IMPLANT;  Surgeon: [name], MD;  [facility];  Service: Cardiovascular;  Laterality: N/A;   PACEMAKER IMPLANT Left    POLYPECTOMY  10/11/2019   Procedure: POLYPECTOMY;  Surgeon: Kathi Der, MD;  [facility];  Service: Gastroenterology;;   RIGHT HEART CATH N/A 06/08/2022   Procedure: RIGHT HEART CATH;  Surgeon: Dolores Patty, MD;  Location: [facility] INVASIVE CV LAB;  Service: Cardiovascular;  Laterality: N/A;   TEE WITHOUT CARDIOVERSION N/A 11/14/2014   Procedure: TRANSESOPHAGEAL ECHOCARDIOGRAM (TEE);  Surgeon: Purcell Nails, MD;  Location: [facility] OR;  Service: Open Heart Surgery;  Laterality: N/A;   TEE WITHOUT CARDIOVERSION N/A 12/25/2017   Procedure: TRANSESOPHAGEAL ECHOCARDIOGRAM (TEE);  Surgeon:  Bensimhon, [name], MD;  Location: [facility] ENDOSCOPY;  Service: Cardiovascular;  Laterality: N/A;   TEMPORARY PACEMAKER INSERTION  11/04/2014   Procedure: TEMPORARY PACEMAKER INSERTION;  Surgeon: Lennette Bihari, MD;  Location: [facility] CATH LAB;  Service: Cardiovascular;;   TOOTH EXTRACTION      OB History   No obstetric history on file.      Home Medications    Prior to Admission medications   Medication Sig Start Date End Date Taking? Authorizing Provider  acetaminophen (TYLENOL) 325 MG tablet Take 2 tablets (650 mg total) by mouth every 4 (four) hours as needed for headache or mild pain. 10/11/22   [name], MD  allopurinol (ZYLOPRIM) 100 MG tablet Take 100 mg by mouth daily. 07/19/21   [provider]  atorvastatin (LIPITOR) 10 MG tablet TAKE 1  TABLET BY MOUTH EVERYDAY AT BEDTIME 03/03/23   [name], MD  gabapentin (NEURONTIN) 100 MG capsule Take 1 capsule (100 mg total) by mouth at bedtime. 11/23/22   Vivi Barrack, DPM  hydrALAZINE (APRESOLINE) 25 MG tablet TAKE 2 TABLETS (50 MG TOTAL) BY MOUTH 3 (THREE) TIMES DAILY. 02/03/23   Bensimhon, [name], MD  losartan (COZAAR) 100 MG tablet Take 1 tablet (100 mg total) by mouth daily. 04/06/23   Bensimhon, [name], MD  metFORMIN (GLUCOPHAGE) 500 MG tablet Take 1 tablet (500 mg total) by mouth daily with breakfast. 03/03/23   [name], MD  metoprolol succinate (TOPROL-XL) 100 MG 24 hr tablet Take 100 mg by mouth daily. Patient not taking: Reported on 04/06/2023 10/06/22   [provider]  Naphazoline HCl (CLEAR EYES OP) Place 1 drop into both eyes daily as needed (irritation).    [provider]  [facility] ULTRA test strip 2 (two) times daily. as directed 11/16/22   [provider]  pantoprazole (PROTONIX) 40 MG tablet TAKE 1 TABLET BY MOUTH EVERY DAY 03/03/23   [name], MD  Rivaroxaban (XARELTO) 15 MG TABS tablet Take 1 tablet (15 mg total) by mouth daily with supper. 12/23/22   Jacklynn Ganong, FNP  spironolactone (ALDACTONE) 25 MG tablet Take 1 tablet (25 mg total) by mouth daily. 12/16/22   Bensimhon, [name], MD  torsemide (DEMADEX) 20 MG tablet Take 1 tablet (20 mg total) by mouth daily. 12/28/22   Jacklynn Ganong, FNP  TRELEGY ELLIPTA 100-62.5-25 MCG/ACT AEPB Inhale ONE PUFF into THE lungs daily 06/27/22   [name], MD  losartan (COZAAR) 50 MG tablet Take 1 tablet (50 mg total) by mouth daily. 12/16/22   Bensimhon, [name], MD    Family History Family History  Problem Relation Age of Onset   Cancer Mother        LYMPHOMA   Heart disease Father    Other Father        TB   CVA Sister    Prostate cancer Brother 39   Diabetes Brother     Social History Social History   Tobacco Use   Smoking status: Former    Current  packs/day: 0.00    Average packs/day: 0.5 packs/day for 56.0 years (28.0 ttl pk-yrs)    Types: Cigarettes    Start date: 11/13/1965    Quit date: 11/13/2021    Years since quitting: 1.4   Smokeless tobacco: Never   Tobacco comments:    smoking 2 cigarettes/day as of 10/23/20  Vaping Use   Vaping status: Never Used  Substance Use Topics   Alcohol  use: No    Alcohol/week: 0.0 standard drinks of alcohol   Drug use: No     Allergies   Bee venom, Ivp dye [iodinated contrast media], Ace inhibitors, Atenolol, Codeine, Entresto [sacubitril-valsartan], Isosorbide, Shrimp [shellfish allergy], Simvastatin, Jardiance [empagliflozin], and Penicillin g   Review of Systems Review of Systems   Physical Exam Triage Vital Signs ED Triage Vitals  Encounter Vitals Group     BP 04/25/23 1403 121/68     Systolic BP Percentile --      Diastolic BP Percentile --      Pulse Rate 04/25/23 1403 60     Resp 04/25/23 1403 18     Temp 04/25/23 1403 97.7 F (36.5 C)     Temp Source 04/25/23 1403 Oral     SpO2 04/25/23 1403 97 %     Weight --      Height --      Head Circumference --      Peak Flow --      Pain Score 04/25/23 1404 3     Pain Loc --      Pain Education --      Exclude from Growth Chart --    No data found.  Updated Vital Signs BP 121/68 (BP Location: Left Arm)   Pulse 60   Temp 97.7 F (36.5 C) (Oral)   Resp 18   SpO2 97%   Visual Acuity Right Eye Distance:   Left Eye Distance:   Bilateral Distance:    Right Eye Near:   Left Eye Near:    Bilateral Near:     Physical Exam Vitals reviewed.  Constitutional:      General: She is not in acute distress.    Appearance: She is not toxic-appearing.  HENT:     Mouth/Throat:     Mouth: Mucous membranes are moist.  Eyes:     Extraocular Movements: Extraocular movements intact.     Pupils: Pupils are equal, round, and reactive to light.  Cardiovascular:     Rate and Rhythm: Normal rate. Rhythm irregular.     Heart  sounds: No murmur heard. Pulmonary:     Effort: Pulmonary effort is normal. No respiratory distress.     Breath sounds: No stridor. No wheezing, rhonchi or rales.     Comments: There is a pacemaker that is subcu and visible in her left upper chest anterior.  Skin:    Coloration: Skin is not pale.  Neurological:     General: No focal deficit present.     Mental Status: She is alert and oriented to person, place, and time.  Psychiatric:        Behavior: Behavior normal.      UC Treatments / Results  Labs (all labs ordered are listed, but only abnormal results are displayed) Labs Reviewed - No data to display  EKG   Radiology No results found.  Procedures Procedures (including critical care time)  Medications Ordered in UC Medications - No data to display  Initial Impression / Assessment and Plan / UC Course  I have reviewed the triage vital signs and the nursing notes.  Pertinent labs & imaging results that were available during my care of the patient were reviewed by me and considered in my medical decision making (see chart for details).        EKG shows a paced rhythm, but no true P waves.  No ST segment changes that can be called significant.  Her vital signs are reassuring,  and I have asked her to go to the emergency room to evaluate for cardiac problems causing her pain.  She is agreeable and will go by private car. Final Clinical Impressions(s) / UC Diagnoses   Final diagnoses:  Precordial pain     Discharge Instructions      Patient will proceed to the emergency room for further evaluation     ED Prescriptions   None    PDMP not reviewed this encounter.   Zenia Resides, MD 04/25/23 [phone]

## 2023-04-25 NOTE — Discharge Instructions (Addendum)
Patient will proceed to the emergency room for further evaluation. 

## 2023-04-25 NOTE — ED Notes (Signed)
Patient is being discharged from the Urgent Care and sent to the Emergency Department via POV . Per Dr. Marlinda Mike, patient is in need of higher level of care due to pacemaker pain. Patient is aware and verbalizes understanding of plan of care.  Vitals:   04/25/23 1403  BP: 121/68  Pulse: 60  Resp: 18  Temp: 97.7 F (36.5 C)  SpO2: 97%

## 2023-04-25 NOTE — ED Triage Notes (Signed)
Pt c/o lt arm pain x2wks and states having pain around her pace maker through to back since yesterday. States took advil with relief. States thought it was from her heavy purse so changed arms.

## 2023-04-26 NOTE — Progress Notes (Signed)
Remote pacemaker transmission.   

## 2023-04-27 ENCOUNTER — Other Ambulatory Visit (HOSPITAL_COMMUNITY): Payer: Self-pay

## 2023-04-28 ENCOUNTER — Telehealth (HOSPITAL_COMMUNITY): Payer: Self-pay

## 2023-04-28 ENCOUNTER — Other Ambulatory Visit (HOSPITAL_COMMUNITY): Payer: Self-pay | Admitting: Emergency Medicine

## 2023-04-28 ENCOUNTER — Other Ambulatory Visit: Payer: Self-pay | Admitting: Internal Medicine

## 2023-04-28 DIAGNOSIS — E1169 Type 2 diabetes mellitus with other specified complication: Secondary | ICD-10-CM

## 2023-04-28 MED ORDER — LANCET DEVICE MISC
1.0000 | Freq: Two times a day (BID) | 3 refills | Status: AC
Start: 2023-04-28 — End: 2023-05-28

## 2023-04-28 MED ORDER — ONETOUCH ULTRA VI STRP: 1.0000 | ORAL_STRIP | Freq: Two times a day (BID) | 3 refills | Status: DC

## 2023-04-28 MED ORDER — ALCOHOL PADS 70 % PADS: 1.0000 | MEDICATED_PAD | Freq: Two times a day (BID) | 3 refills | Status: DC

## 2023-04-28 MED ORDER — ONETOUCH ULTRA 2 W/DEVICE KIT: 1.0000 | PACK | Freq: Two times a day (BID) | 3 refills | Status: DC

## 2023-04-28 MED ORDER — LANCETS MISC. MISC
1.0000 | Freq: Two times a day (BID) | 3 refills | Status: AC
Start: 2023-04-28 — End: 2023-05-28

## 2023-04-28 NOTE — Telephone Encounter (Signed)
[name]- community paramedic saw Adyn Askins in the home today and reports she does not have proper glucose measuring supplies in the home. I will forward to PCP to have them send in same to her desired pharmacy.   Ms. Dice needs glucometer, test strips, lancets, alcohol pads. Please send to [facility] on [address]   Thanks!   [name], EMT-Paramedic [phone] 04/28/2023

## 2023-04-28 NOTE — Progress Notes (Unsigned)
Paramedicine Encounter    Patient ID: [name] [name], female    DOB: [birth year], 77 y.o.   MRN: [id number]   Complaints***  Assessment***  Compliance with meds***  Pill box filled***  Refills needed Trelegy  Meds changes since last visit NONE    Social changes none   BP (!) 140/90 (BP Location: Left Arm, Patient Position: Sitting, Cuff Size: Normal)   Pulse 60   Resp 14   Wt 173 lb (78.5 kg)   SpO2 98%   BMI 27.10 kg/m  Weight yesterday- not taken Last visit weight-171lb CBG 98  ACTION: Home visit completed  [name] [phone] 04/28/23  Patient Care Team: [name], MD as PCP - General (Internal Medicine) [name], MD as PCP - Cardiology (Cardiology) [name], MD as PCP - Electrophysiology (Cardiology) Bensimhon, [name], MD as PCP - Advanced Heart Failure (Cardiology) [name] [name], MD as Consulting Physician (Cardiology)  Patient Active Problem List   Diagnosis Date Noted  . Dysuria 12/29/2022  . Cough productive of purulent sputum 12/29/2022  . Type 2 diabetes mellitus with hyperlipidemia (HCC) 12/14/2022  . Acute cough 12/12/2022  . Ceruminosis, bilateral 06/17/2022  . Flu vaccine need 06/17/2022  . Chronic combined systolic and diastolic CHF (congestive heart failure) (HCC) 06/17/2022  . Encounter for general adult medical examination with abnormal findings 06/16/2022  . Chronic anticoagulation 06/06/2022  . Mitral regurgitation 06/06/2022  . Controlled type 2 diabetes mellitus without complication, without long-term current use of insulin (HCC) 06/06/2022  . Pacemaker 02/22/2022  . Hypertensive urgency 06/08/2021  . Obstructive sleep apnea 09/21/2020  . Obesity (BMI 30.0-34.9) 02/10/2020  . COPD (chronic obstructive pulmonary disease) (HCC) 10/26/2019  . Thrombocytopenia (HCC) 09/14/2017  . Medication noncompliance due to cognitive impairment 09/06/2017  . Persistent atrial fibrillation (HCC) 08/04/2017   . Cardiomyopathy, ischemic 02/08/2017  . CKD (chronic kidney disease), stage III (HCC) 01/30/2017  . Coronary artery disease due to lipid rich plaque   . Essential hypertension     Current Outpatient Medications:  .  acetaminophen (TYLENOL) 325 MG tablet, Take 2 tablets (650 mg total) by mouth every 4 (four) hours as needed for headache or mild pain., Disp: , Rfl:  .  allopurinol (ZYLOPRIM) 100 MG tablet, Take 100 mg by mouth daily., Disp: , Rfl:  .  atorvastatin (LIPITOR) 10 MG tablet, TAKE 1 TABLET BY MOUTH EVERYDAY AT BEDTIME, Disp: 90 tablet, Rfl: 0 .  gabapentin (NEURONTIN) 100 MG capsule, Take 1 capsule (100 mg total) by mouth at bedtime., Disp: 90 capsule, Rfl: 0 .  hydrALAZINE (APRESOLINE) 25 MG tablet, TAKE 2 TABLETS (50 MG TOTAL) BY MOUTH 3 (THREE) TIMES DAILY., Disp: 270 tablet, Rfl: 3 .  losartan (COZAAR) 100 MG tablet, Take 1 tablet (100 mg total) by mouth daily., Disp: 90 tablet, Rfl: 3 .  metFORMIN (GLUCOPHAGE) 500 MG tablet, Take 1 tablet (500 mg total) by mouth daily with breakfast., Disp: 90 tablet, Rfl: 0 .  Naphazoline HCl (CLEAR EYES OP), Place 1 drop into both eyes daily as needed (irritation)., Disp: , Rfl:  .  pantoprazole (PROTONIX) 40 MG tablet, TAKE 1 TABLET BY MOUTH EVERY DAY, Disp: 90 tablet, Rfl: 0 .  Rivaroxaban (XARELTO) 15 MG TABS tablet, Take 1 tablet (15 mg total) by mouth daily with supper., Disp: 30 tablet, Rfl: 8 .  spironolactone (ALDACTONE) 25 MG tablet, Take 1 tablet (25 mg total) by mouth daily., Disp: 30 tablet, Rfl: 11 .  torsemide (DEMADEX) 20  MG tablet, Take 1 tablet (20 mg total) by mouth daily., Disp: 90 tablet, Rfl: 3 .  TRELEGY ELLIPTA 100-62.5-25 MCG/ACT AEPB, Inhale ONE PUFF into THE lungs daily, Disp: 120 each, Rfl: 1 .  metoprolol succinate (TOPROL-XL) 100 MG 24 hr tablet, Take 100 mg by mouth daily. (Patient not taking: Reported on 04/06/2023), Disp: , Rfl:  .  ONETOUCH ULTRA test strip, 2 (two) times daily. as directed (Patient not taking:  Reported on 04/28/2023), Disp: , Rfl:  Allergies  Allergen Reactions  . Bee Venom Anaphylaxis  . Ivp Dye [Iodinated Contrast Media] Anaphylaxis  . Ace Inhibitors     angioedema  . Atenolol     severe headaches  . Codeine Nausea And Vomiting  . Entresto [Sacubitril-Valsartan] Other (See Comments)    Chest pain   . Isosorbide     Severe headaches  . Shrimp [Shellfish Allergy] Swelling  . Simvastatin     not sure  . Jardiance [Empagliflozin] Other (See Comments)    Caused Boils   . Penicillin G Rash     Social History   Socioeconomic History  . Marital status: Widowed    Spouse name: Not on file  . Number of children: 1  . Years of education: 46  . Highest education level: Not on file  Occupational History  . Occupation: RETIRED [employer]  Tobacco Use  . Smoking status: Former    Current packs/day: 0.00    Average packs/day: 0.5 packs/day for 56.0 years (28.0 ttl pk-yrs)    Types: Cigarettes    Start date: 11/13/1965    Quit date: 11/13/2021    Years since quitting: 1.4  . Smokeless tobacco: Never  . Tobacco comments:    smoking 2 cigarettes/day as of 10/23/20  Vaping Use  . Vaping status: Never Used  Substance and Sexual Activity  . Alcohol use: No    Alcohol/week: 0.0 standard drinks of alcohol  . Drug use: No  . Sexual activity: Not on file  Other Topics Concern  . Not on file  Social History Narrative   Patient reports it being difficult to pay for everything due to outstanding medical bills from hospital stay last year but states she is able to keep up with basic expenses.  Patient does have some concerns with her house's condition due to a water leak- had her roof repaired last month but has some leaking in the front which has affected her porch.  Patient owns her own car and is able to drive herself but has some concerns about the reliability of her car- has gotten taxi to the clinic for appointment in the past when her car broke down.   Social  Determinants of Health   Financial Resource Strain: Low Risk  (06/07/2018)   Overall Financial Resource Strain (CARDIA)   . Difficulty of Paying Living Expenses: Not hard at all  Recent Concern: Financial Resource Strain - Medium Risk (05/21/2018)   Overall Financial Resource Strain (CARDIA)   . Difficulty of Paying Living Expenses: Somewhat hard  Food Insecurity: No Food Insecurity (12/15/2022)   Hunger Vital Sign   . Worried About Programme researcher, broadcasting/film/video in the Last Year: Never true   . Ran Out of Food in the Last Year: Never true  Transportation Needs: No Transportation Needs (12/15/2022)   PRAPARE - Transportation   . Lack of Transportation (Medical): No   . Lack of Transportation (Non-Medical): No  Physical Activity: Not on file  Stress: Stress Concern Present (02/22/2023)  [employer] of Occupational Health - Occupational Stress Questionnaire   . Feeling of Stress : To some extent  Social Connections: Not on file  Intimate Partner Violence: Not At Risk (06/09/2022)   Humiliation, Afraid, Rape, and Kick questionnaire   . Fear of Current or Ex-Partner: No   . Emotionally Abused: No   . Physically Abused: No   . Sexually Abused: No    Physical Exam      Future Appointments  Date Time Provider Department Center  07/07/2023  1:30 PM [facility] PA/NP [facility] None  07/18/2023  7:00 AM [facility] DEVICE REMOTES [facility]  10/17/2023  7:00 AM [facility] DEVICE REMOTES [facility]

## 2023-05-12 ENCOUNTER — Other Ambulatory Visit (HOSPITAL_COMMUNITY): Payer: Self-pay | Admitting: Emergency Medicine

## 2023-05-12 NOTE — Progress Notes (Unsigned)
Paramedicine Encounter    Patient ID: [name] [name], female    DOB: 02/13/1946, 77 y.o.   MRN: [id number]   Complaints NONE  Assessment A&O x 4, skin W&D w/ good color.  Lung sounds clear and equal bilat  Compliance with meds***  Pill box filled***  Refills needed gabapentin, Xarelto, trelegy  Meds changes since last visit***    Social changes NONE   There were no vitals taken for this visit. Weight yesterday- not taken Last visit weight-173lb CBG 101 ACTION: {Paramed Action:[phone]}  [name], EMT-Paramedic [phone] 05/12/23  Patient Care Team: [name], MD as PCP - General (Internal Medicine) [name], MD as PCP - Cardiology (Cardiology) [name], MD as PCP - Electrophysiology (Cardiology) Bensimhon, [name], MD as PCP - Advanced Heart Failure (Cardiology) [name] [name], MD as Consulting Physician (Cardiology)  Patient Active Problem List   Diagnosis Date Noted  . Dysuria 12/29/2022  . Cough productive of purulent sputum 12/29/2022  . Type 2 diabetes mellitus with hyperlipidemia (HCC) 12/14/2022  . Acute cough 12/12/2022  . Ceruminosis, bilateral 06/17/2022  . Flu vaccine need 06/17/2022  . Chronic combined systolic and diastolic CHF (congestive heart failure) (HCC) 06/17/2022  . Encounter for general adult medical examination with abnormal findings 06/16/2022  . Chronic anticoagulation 06/06/2022  . Mitral regurgitation 06/06/2022  . Controlled type 2 diabetes mellitus without complication, without long-term current use of insulin (HCC) 06/06/2022  . Pacemaker 02/22/2022  . Hypertensive urgency 06/08/2021  . Obstructive sleep apnea 09/21/2020  . Obesity (BMI 30.0-34.9) 02/10/2020  . COPD (chronic obstructive pulmonary disease) (HCC) 10/26/2019  . Thrombocytopenia (HCC) 09/14/2017  . Medication noncompliance due to cognitive impairment 09/06/2017  . Persistent atrial fibrillation (HCC) 08/04/2017  . Cardiomyopathy, ischemic  02/08/2017  . CKD (chronic kidney disease), stage III (HCC) 01/30/2017  . Coronary artery disease due to lipid rich plaque   . Essential hypertension     Current Outpatient Medications:  .  acetaminophen (TYLENOL) 325 MG tablet, Take 2 tablets (650 mg total) by mouth every 4 (four) hours as needed for headache or mild pain., Disp: , Rfl:  .  Alcohol Swabs (ALCOHOL PADS) 70 % PADS, 1 Act by Does not apply route 2 (two) times daily., Disp: 100 each, Rfl: 3 .  allopurinol (ZYLOPRIM) 100 MG tablet, Take 100 mg by mouth daily., Disp: , Rfl:  .  atorvastatin (LIPITOR) 10 MG tablet, TAKE 1 TABLET BY MOUTH EVERYDAY AT BEDTIME, Disp: 90 tablet, Rfl: 0 .  Blood Glucose Monitoring Suppl (ONE TOUCH ULTRA 2) w/Device KIT, 1 Act by Does not apply route 2 (two) times daily., Disp: 1 kit, Rfl: 3 .  gabapentin (NEURONTIN) 100 MG capsule, Take 1 capsule (100 mg total) by mouth at bedtime., Disp: 90 capsule, Rfl: 0 .  hydrALAZINE (APRESOLINE) 25 MG tablet, TAKE 2 TABLETS (50 MG TOTAL) BY MOUTH 3 (THREE) TIMES DAILY., Disp: 270 tablet, Rfl: 3 .  Lancet Device MISC, 1 each by Does not apply route in the morning and at bedtime. May substitute to any manufacturer covered by patient's insurance., Disp: 1 each, Rfl: 3 .  Lancets Misc. MISC, 1 each by Does not apply route in the morning and at bedtime. May substitute to any manufacturer covered by patient's insurance., Disp: 100 each, Rfl: 3 .  losartan (COZAAR) 100 MG tablet, Take 1 tablet (100 mg total) by mouth daily., Disp: 90 tablet, Rfl: 3 .  metFORMIN (GLUCOPHAGE) 500 MG tablet, Take 1 tablet (500  mg total) by mouth daily with breakfast., Disp: 90 tablet, Rfl: 0 .  metoprolol succinate (TOPROL-XL) 100 MG 24 hr tablet, Take 100 mg by mouth daily. (Patient not taking: Reported on 04/06/2023), Disp: , Rfl:  .  Naphazoline HCl (CLEAR EYES OP), Place 1 drop into both eyes daily as needed (irritation)., Disp: , Rfl:  .  ONETOUCH ULTRA test strip, 1 each by Other route 2  (two) times daily. as directed, Disp: 100 each, Rfl: 3 .  pantoprazole (PROTONIX) 40 MG tablet, TAKE 1 TABLET BY MOUTH EVERY DAY, Disp: 90 tablet, Rfl: 0 .  Rivaroxaban (XARELTO) 15 MG TABS tablet, Take 1 tablet (15 mg total) by mouth daily with supper., Disp: 30 tablet, Rfl: 8 .  spironolactone (ALDACTONE) 25 MG tablet, Take 1 tablet (25 mg total) by mouth daily., Disp: 30 tablet, Rfl: 11 .  torsemide (DEMADEX) 20 MG tablet, Take 1 tablet (20 mg total) by mouth daily., Disp: 90 tablet, Rfl: 3 .  TRELEGY ELLIPTA 100-62.5-25 MCG/ACT AEPB, Inhale ONE PUFF into THE lungs daily, Disp: 120 each, Rfl: 1 Allergies  Allergen Reactions  . Bee Venom Anaphylaxis  . Ivp Dye [Iodinated Contrast Media] Anaphylaxis  . Ace Inhibitors     angioedema  . Atenolol     severe headaches  . Codeine Nausea And Vomiting  . Entresto [Sacubitril-Valsartan] Other (See Comments)    Chest pain   . Isosorbide     Severe headaches  . Shrimp [Shellfish Allergy] Swelling  . Simvastatin     not sure  . Jardiance [Empagliflozin] Other (See Comments)    Caused Boils   . Penicillin G Rash     Social History   Socioeconomic History  . Marital status: Widowed    Spouse name: Not on file  . Number of children: 1  . Years of education: 65  . Highest education level: Not on file  Occupational History  . Occupation: RETIRED [employer]  Tobacco Use  . Smoking status: Former    Current packs/day: 0.00    Average packs/day: 0.5 packs/day for 56.0 years (28.0 ttl pk-yrs)    Types: Cigarettes    Start date: 11/13/1965    Quit date: 11/13/2021    Years since quitting: 1.4  . Smokeless tobacco: Never  . Tobacco comments:    smoking 2 cigarettes/day as of 10/23/20  Vaping Use  . Vaping status: Never Used  Substance and Sexual Activity  . Alcohol use: No    Alcohol/week: 0.0 standard drinks of alcohol  . Drug use: No  . Sexual activity: Not on file  Other Topics Concern  . Not on file  Social History  Narrative   Patient reports it being difficult to pay for everything due to outstanding medical bills from hospital stay last year but states she is able to keep up with basic expenses.  Patient does have some concerns with her house's condition due to a water leak- had her roof repaired last month but has some leaking in the front which has affected her porch.  Patient owns her own car and is able to drive herself but has some concerns about the reliability of her car- has gotten taxi to the clinic for appointment in the past when her car broke down.   Social Determinants of Health   Financial Resource Strain: Low Risk  (06/07/2018)   Overall Financial Resource Strain (CARDIA)   . Difficulty of Paying Living Expenses: Not hard at all  Recent Concern: Physicist, medical  Strain - Medium Risk (05/21/2018)   Overall Financial Resource Strain (CARDIA)   . Difficulty of Paying Living Expenses: Somewhat hard  Food Insecurity: No Food Insecurity (12/15/2022)   Hunger Vital Sign   . Worried About Programme researcher, broadcasting/film/video in the Last Year: Never true   . Ran Out of Food in the Last Year: Never true  Transportation Needs: No Transportation Needs (12/15/2022)   PRAPARE - Transportation   . Lack of Transportation (Medical): No   . Lack of Transportation (Non-Medical): No  Physical Activity: Not on file  Stress: Stress Concern Present (02/22/2023)   [employer] of Occupational Health - Occupational Stress Questionnaire   . Feeling of Stress : To some extent  Social Connections: Not on file  Intimate Partner Violence: Not At Risk (06/09/2022)   Humiliation, Afraid, Rape, and Kick questionnaire   . Fear of Current or Ex-Partner: No   . Emotionally Abused: No   . Physically Abused: No   . Sexually Abused: No    Physical Exam      Future Appointments  Date Time Provider Department Center  07/07/2023  1:30 PM [facility] PA/NP [facility] None  07/18/2023  7:00 AM [facility] DEVICE REMOTES [facility]  10/17/2023  7:00 AM [facility] DEVICE REMOTES [facility]

## 2023-05-15 ENCOUNTER — Other Ambulatory Visit: Payer: Self-pay | Admitting: Podiatry

## 2023-05-15 ENCOUNTER — Telehealth (HOSPITAL_COMMUNITY): Payer: Self-pay

## 2023-05-15 NOTE — Telephone Encounter (Signed)
Spoke to [facility] on [address]. About [facility]ika's medications. I requested the following refills and obtained the information of her copays.   -Xarelto $47 -Trellegy $47  -Gabapentin $5 (needs new RX)   I will report same to [name]- she does not qualify for J&J assistance for Xarelto as she has [facility] Medicare prescription drug coverage. She could obtain samples from the clinic if it's an affordability issue.   Trelegy assistance is an option- IF she has met OOP. I will print off application and have [name] follow up if patient needs it.   [name], EMT-Paramedic [phone] 05/15/2023

## 2023-05-16 ENCOUNTER — Telehealth (HOSPITAL_COMMUNITY): Payer: Self-pay

## 2023-05-16 ENCOUNTER — Encounter (HOSPITAL_COMMUNITY): Payer: Self-pay

## 2023-05-16 NOTE — Telephone Encounter (Signed)
Patient does not qualify for J&J patient assistance as she has Medicare PT D. I will update [name] and see if the clinic is still able to provide samples in her time of being unable to afford same and if it will be an ongoing issue we can reach out to pharmacy team to get involved.   [name], EMT-Paramedic [phone] 05/16/2023

## 2023-05-16 NOTE — Progress Notes (Signed)
Medication Samples have been provided to the patient.  Drug name: xarelto       Strength: 15mg         Qty: 4 bottles  LOT: [id number]  Exp.Date: 11/25  Dosing instructions: take 1 tablet daily  The patient has been instructed regarding the correct time, dose, and frequency of taking this medication, including desired effects and most common side effects.   Charnese Federici M Xian Apostol 10:08 AM 05/16/2023

## 2023-05-16 NOTE — Telephone Encounter (Signed)
Could we provide Mrs. Ledbetter with samples of Xarelto 15mg  please?  She is wanting to apply for patient assistance and we will be assisting with same but she cannot afford her $47 copay at present.  [name] will pick up samples at HF clinic once ready. Thanks!   [name], EMT-Paramedic [phone] 05/16/2023

## 2023-06-02 ENCOUNTER — Other Ambulatory Visit (HOSPITAL_COMMUNITY): Payer: Self-pay | Admitting: Emergency Medicine

## 2023-06-02 NOTE — Progress Notes (Signed)
Paramedicine Encounter    Patient ID: [name] [name], female    DOB: August 01, 1946, 77 y.o.   MRN: [id number]   Complaints NONE  Assessment ATF [name] A&O x 4, skin W&D w/ good color.  She denies chest pain or SOB.  Lung sounds clear and equal throughout.  No peripheral edema noted.  Compliance with meds Pt. Says she is compliant  Pill box filled  n/a  Refills needed NONE  Meds changes since last visit NONE    Social changes NONE   BP 122/78 (BP Location: Left Arm, Patient Position: Sitting, Cuff Size: Normal)   Pulse 60   SpO2 97%  Weight yesterday- not taken Last visit weight-173lb  [name] reports to be doing well today and has just returned home from early voting.  She denies chest pain or SOB.  Lung sounds clear throughout and no peripheral edema noted. We reviewed each medication bottle by bottle.  Although she tells me she is compliant I still have no way to verify same.  Bottle look old and worn.  She did receive a new glucometer and supplies and I demonstrated to her how to use same.   CBG 140.   [name]' sister who lives in [place] is going through cancer treatments and [name] is contemplating making a trip to visit her.  She tells me she will advise of any upcoming travel plans. Next home visit 06/16/23 @ 2:00.  ACTION: Home visit completed  [name] [phone] 06/02/23  Patient Care Team: [name], MD as PCP - General (Internal Medicine) [name], MD as PCP - Cardiology (Cardiology) [name], MD as PCP - Electrophysiology (Cardiology) Bensimhon, [name], MD as PCP - Advanced Heart Failure (Cardiology) Chrystie Nose, MD as Consulting Physician (Cardiology)  Patient Active Problem List   Diagnosis Date Noted   Dysuria 12/29/2022   Cough productive of purulent sputum 12/29/2022   Type 2 diabetes mellitus with hyperlipidemia (HCC) 12/14/2022   Acute cough 12/12/2022   Ceruminosis, bilateral 06/17/2022   Flu  vaccine need 06/17/2022   Chronic combined systolic and diastolic CHF (congestive heart failure) (HCC) 06/17/2022   Encounter for general adult medical examination with abnormal findings 06/16/2022   Chronic anticoagulation 06/06/2022   Mitral regurgitation 06/06/2022   Controlled type 2 diabetes mellitus without complication, without long-term current use of insulin (HCC) 06/06/2022   Pacemaker 02/22/2022   Hypertensive urgency 06/08/2021   Obstructive sleep apnea 09/21/2020   Obesity (BMI 30.0-34.9) 02/10/2020   COPD (chronic obstructive pulmonary disease) (HCC) 10/26/2019   Thrombocytopenia (HCC) 09/14/2017   Medication noncompliance due to cognitive impairment 09/06/2017   Persistent atrial fibrillation (HCC) 08/04/2017   Cardiomyopathy, ischemic 02/08/2017   CKD (chronic kidney disease), stage III (HCC) 01/30/2017   Coronary artery disease due to lipid rich plaque    Essential hypertension     Current Outpatient Medications:    acetaminophen (TYLENOL) 325 MG tablet, Take 2 tablets (650 mg total) by mouth every 4 (four) hours as needed for headache or mild pain., Disp: , Rfl:    Alcohol Swabs (ALCOHOL PADS) 70 % PADS, 1 Act by Does not apply route 2 (two) times daily., Disp: 100 each, Rfl: 3   allopurinol (ZYLOPRIM) 100 MG tablet, Take 100 mg by mouth daily., Disp: , Rfl:    atorvastatin (LIPITOR) 10 MG tablet, TAKE 1 TABLET BY MOUTH EVERYDAY AT BEDTIME, Disp: 90 tablet, Rfl: 0   Blood Glucose Monitoring Suppl (ONE  TOUCH ULTRA 2) w/Device KIT, 1 Act by Does not apply route 2 (two) times daily., Disp: 1 kit, Rfl: 3   gabapentin (NEURONTIN) 100 MG capsule, TAKE 1 CAPSULE BY MOUTH EVERYDAY AT BEDTIME, Disp: 30 capsule, Rfl: 1   hydrALAZINE (APRESOLINE) 25 MG tablet, TAKE 2 TABLETS (50 MG TOTAL) BY MOUTH 3 (THREE) TIMES DAILY., Disp: 270 tablet, Rfl: 3   losartan (COZAAR) 100 MG tablet, Take 1 tablet (100 mg total) by mouth daily., Disp: 90 tablet, Rfl: 3   metFORMIN (GLUCOPHAGE) 500 MG  tablet, Take 1 tablet (500 mg total) by mouth daily with breakfast., Disp: 90 tablet, Rfl: 0   Naphazoline HCl (CLEAR EYES OP), Place 1 drop into both eyes daily as needed (irritation)., Disp: , Rfl:    ONETOUCH ULTRA test strip, 1 each by Other route 2 (two) times daily. as directed, Disp: 100 each, Rfl: 3   pantoprazole (PROTONIX) 40 MG tablet, TAKE 1 TABLET BY MOUTH EVERY DAY, Disp: 90 tablet, Rfl: 0   Rivaroxaban (XARELTO) 15 MG TABS tablet, Take 1 tablet (15 mg total) by mouth daily with supper., Disp: 30 tablet, Rfl: 8   spironolactone (ALDACTONE) 25 MG tablet, Take 1 tablet (25 mg total) by mouth daily., Disp: 30 tablet, Rfl: 11   torsemide (DEMADEX) 20 MG tablet, Take 1 tablet (20 mg total) by mouth daily., Disp: 90 tablet, Rfl: 3   TRELEGY ELLIPTA 100-62.5-25 MCG/ACT AEPB, Inhale ONE PUFF into THE lungs daily, Disp: 120 each, Rfl: 1   metoprolol succinate (TOPROL-XL) 100 MG 24 hr tablet, Take 100 mg by mouth daily. (Patient not taking: Reported on 04/06/2023), Disp: , Rfl:  Allergies  Allergen Reactions   Bee Venom Anaphylaxis   Ivp Dye [Iodinated Contrast Media] Anaphylaxis   Ace Inhibitors     angioedema   Atenolol     severe headaches   Codeine Nausea And Vomiting   Entresto [Sacubitril-Valsartan] Other (See Comments)    Chest pain    Isosorbide     Severe headaches   Shrimp [Shellfish Allergy] Swelling   Simvastatin     not sure   Jardiance [Empagliflozin] Other (See Comments)    Caused Boils    Penicillin G Rash     Social History   Socioeconomic History   Marital status: Widowed    Spouse name: Not on file   Number of children: 1   Years of education: 79   Highest education level: Not on file  Occupational History   Occupation: RETIRED [employer]  Tobacco Use   Smoking status: Former    Current packs/day: 0.00    Average packs/day: 0.5 packs/day for 56.0 years (28.0 ttl pk-yrs)    Types: Cigarettes    Start date: 11/13/1965    Quit date: 11/13/2021     Years since quitting: 1.5   Smokeless tobacco: Never   Tobacco comments:    smoking 2 cigarettes/day as of 10/23/20  Vaping Use   Vaping status: Never Used  Substance and Sexual Activity   Alcohol use: No    Alcohol/week: 0.0 standard drinks of alcohol   Drug use: No   Sexual activity: Not on file  Other Topics Concern   Not on file  Social History Narrative   Patient reports it being difficult to pay for everything due to outstanding medical bills from hospital stay last year but states she is able to keep up with basic expenses.  Patient does have some concerns with her house's condition due to a  water leak- had her roof repaired last month but has some leaking in the front which has affected her porch.  Patient owns her own car and is able to drive herself but has some concerns about the reliability of her car- has gotten taxi to the clinic for appointment in the past when her car broke down.   Social Determinants of Health   Financial Resource Strain: Low Risk  (06/07/2018)   Overall Financial Resource Strain (CARDIA)    Difficulty of Paying Living Expenses: Not hard at all  Recent Concern: Financial Resource Strain - Medium Risk (05/21/2018)   Overall Financial Resource Strain (CARDIA)    Difficulty of Paying Living Expenses: Somewhat hard  Food Insecurity: No Food Insecurity (12/15/2022)   Hunger Vital Sign    Worried About Running Out of Food in the Last Year: Never true    Ran Out of Food in the Last Year: Never true  Transportation Needs: No Transportation Needs (12/15/2022)   PRAPARE - Administrator, Civil Service (Medical): No    Lack of Transportation (Non-Medical): No  Physical Activity: Not on file  Stress: Stress Concern Present (02/22/2023)   [employer] of Occupational Health - Occupational Stress Questionnaire    Feeling of Stress : To some extent  Social Connections: Not on file  Intimate Partner Violence: Not At Risk (06/09/2022)   Humiliation,  Afraid, Rape, and Kick questionnaire    Fear of Current or Ex-Partner: No    Emotionally Abused: No    Physically Abused: No    Sexually Abused: No    Physical Exam      Future Appointments  Date Time Provider Department Center  07/07/2023  1:30 PM [facility] PA/NP [facility] None  07/18/2023  7:00 AM [facility] DEVICE REMOTES [facility]  10/17/2023  7:00 AM [facility] DEVICE REMOTES [facility]

## 2023-06-12 ENCOUNTER — Encounter (HOSPITAL_COMMUNITY): Payer: Self-pay | Admitting: Emergency Medicine

## 2023-06-12 ENCOUNTER — Emergency Department (HOSPITAL_COMMUNITY): Payer: Medicare Other

## 2023-06-12 ENCOUNTER — Emergency Department (HOSPITAL_COMMUNITY)
Admission: EM | Admit: 2023-06-12 | Discharge: 2023-06-12 | Disposition: A | Discharge status: Home / Self Care | Payer: Medicare Other | Attending: Emergency Medicine | Admitting: Emergency Medicine

## 2023-06-12 DIAGNOSIS — Z79899 Other long term (current) drug therapy: Secondary | ICD-10-CM | POA: Insufficient documentation

## 2023-06-12 DIAGNOSIS — Z951 Presence of aortocoronary bypass graft: Secondary | ICD-10-CM | POA: Diagnosis not present

## 2023-06-12 DIAGNOSIS — R079 Chest pain, unspecified: Secondary | ICD-10-CM | POA: Diagnosis not present

## 2023-06-12 DIAGNOSIS — R0602 Shortness of breath: Secondary | ICD-10-CM | POA: Diagnosis not present

## 2023-06-12 DIAGNOSIS — Z20822 Contact with and (suspected) exposure to covid-19: Secondary | ICD-10-CM | POA: Diagnosis not present

## 2023-06-12 DIAGNOSIS — R61 Generalized hyperhidrosis: Secondary | ICD-10-CM | POA: Insufficient documentation

## 2023-06-12 DIAGNOSIS — Z955 Presence of coronary angioplasty implant and graft: Secondary | ICD-10-CM | POA: Diagnosis not present

## 2023-06-12 DIAGNOSIS — I509 Heart failure, unspecified: Secondary | ICD-10-CM | POA: Insufficient documentation

## 2023-06-12 DIAGNOSIS — Z7901 Long term (current) use of anticoagulants: Secondary | ICD-10-CM | POA: Diagnosis not present

## 2023-06-12 DIAGNOSIS — R0789 Other chest pain: Secondary | ICD-10-CM | POA: Diagnosis not present

## 2023-06-12 DIAGNOSIS — I110 Hypertensive heart disease with heart failure: Secondary | ICD-10-CM | POA: Insufficient documentation

## 2023-06-12 LAB — CBC
HCT: 45.8 % (ref 36.0–46.0)
Hemoglobin: 15 g/dL (ref 12.0–15.0)
MCH: 29.9 pg (ref 26.0–34.0)
MCHC: 32.8 g/dL (ref 30.0–36.0)
MCV: 91.4 fL (ref 80.0–100.0)
Platelets: 111 10*3/uL — ABNORMAL LOW (ref 150–400)
RBC: 5.01 MIL/uL (ref 3.87–5.11)
RDW: 15.9 % — ABNORMAL HIGH (ref 11.5–15.5)
WBC: 5.7 10*3/uL (ref 4.0–10.5)
nRBC: 0 % (ref 0.0–0.2)

## 2023-06-12 LAB — BASIC METABOLIC PANEL
Anion gap: 11 (ref 5–15)
BUN: 23 mg/dL (ref 8–23)
CO2: 20 mmol/L — ABNORMAL LOW (ref 22–32)
Calcium: 9.3 mg/dL (ref 8.9–10.3)
Chloride: 112 mmol/L — ABNORMAL HIGH (ref 98–111)
Creatinine, Ser: 1.25 mg/dL — ABNORMAL HIGH (ref 0.44–1.00)
GFR, Estimated: 44 mL/min — ABNORMAL LOW (ref >60)
Glucose, Bld: 94 mg/dL (ref 70–99)
Potassium: 3.8 mmol/L (ref 3.5–5.1)
Sodium: 143 mmol/L (ref 135–145)

## 2023-06-12 LAB — RESP PANEL BY RT-PCR (RSV, FLU A&B, COVID)  RVPGX2
Influenza A by PCR: NEGATIVE
Influenza B by PCR: NEGATIVE
Resp Syncytial Virus by PCR: NEGATIVE
SARS Coronavirus 2 by RT PCR: NEGATIVE

## 2023-06-12 LAB — BRAIN NATRIURETIC PEPTIDE: B Natriuretic Peptide: 295.2 pg/mL — ABNORMAL HIGH (ref 0.0–100.0)

## 2023-06-12 LAB — TROPONIN I (HIGH SENSITIVITY)
Troponin I (High Sensitivity): 25 ng/L — ABNORMAL HIGH (ref <18)
Troponin I (High Sensitivity): 24 ng/L — ABNORMAL HIGH (ref <18)

## 2023-06-12 NOTE — ED Provider Triage Note (Signed)
Emergency Medicine Provider Triage Evaluation Note  [name] [name] , a 77 y.o. female  was evaluated in triage.  Pt complains of left upper chest pain.  This began yesterday.  He states he has a cold right now and is coughing a lot.  Believes the pain is due to muscle strain from the coughing.  Cough is occasionally productive of clear sputum.  No fevers or chills.  No shortness of breath.  She reports the pain to be localized mostly around the area of her pacemaker  Review of Systems  Positive: As above Negative: As above  Physical Exam  BP (!) 140/83 (BP Location: Right Arm)   Pulse 60   Temp 99 F (37.2 C)   Resp 15   SpO2 99%  Gen:   Awake, no distress   Resp:  Normal effort  MSK:   Moves extremities without difficulty  Other:  Left upper chest mildly TTP  Medical Decision Making  Medically screening exam initiated at 2:49 PM.  Appropriate orders placed.  [name] [name] was informed that the remainder of the evaluation will be completed by another provider, this initial triage assessment does not replace that evaluation, and the importance of remaining in the ED until their evaluation is complete.  Workup initiated   Michelle Piper, Cordelia Poche 06/12/23 1450

## 2023-06-12 NOTE — ED Provider Notes (Signed)
 [place] EMERGENCY DEPARTMENT AT [name] Provider Note   CSN: [id number] Arrival date & time: 06/12/23  1307     History Chief Complaint  Patient presents with   Chest Pain    [name] [name] is a 77 y.o. female.  Patient past history significant for hypertension, permanent atrial fibrillation, coronary artery status post CABG x 2, congestive heart failure, ischemic cardiomyopathy, hyperlipidemia and Biotronik PPM here with concerns of chest pain.  Endorses that she is experiencing left-sided chest pain with radiation into the left shoulder with some slight shortness of breath but states that she only is also currently dealing with a cold that has been present for the last 3 days or so.  Denies any subjective fever, chills, or bodyaches.  Feels that she has had some increasing gas production but denies any vomiting or diarrhea.  Not currently nauseous.  Denies on her symptoms began that she was experiencing any sort of diaphoresis.  She is currently on Xarelto and states she has not missed this medication.   Chest Pain      Home Medications Prior to Admission medications   Medication Sig Start Date End Date Taking? Authorizing Provider  acetaminophen (TYLENOL) 325 MG tablet Take 2 tablets (650 mg total) by mouth every 4 (four) hours as needed for headache or mild pain. 10/11/22   [name], MD  Alcohol Swabs (ALCOHOL PADS) 70 % PADS 1 Act by Does not apply route 2 (two) times daily. 04/28/23   [name], MD  allopurinol (ZYLOPRIM) 100 MG tablet Take 100 mg by mouth daily. 07/19/21   [provider]  atorvastatin (LIPITOR) 10 MG tablet TAKE 1 TABLET BY MOUTH EVERYDAY AT BEDTIME 03/03/23   [name], MD  Blood Glucose Monitoring Suppl (ONE TOUCH ULTRA 2) w/Device KIT 1 Act by Does not apply route 2 (two) times daily. 04/28/23   [name], MD  gabapentin (NEURONTIN) 100 MG capsule TAKE 1 CAPSULE BY MOUTH EVERYDAY AT BEDTIME 05/16/23   Vivi Barrack, DPM  hydrALAZINE (APRESOLINE) 25 MG tablet TAKE 2 TABLETS (50 MG TOTAL) BY MOUTH 3 (THREE) TIMES DAILY. 02/03/23   Bensimhon, [name], MD  losartan (COZAAR) 100 MG tablet Take 1 tablet (100 mg total) by mouth daily. 04/06/23   Bensimhon, [name], MD  metFORMIN (GLUCOPHAGE) 500 MG tablet Take 1 tablet (500 mg total) by mouth daily with breakfast. 03/03/23   [name], MD  metoprolol succinate (TOPROL-XL) 100 MG 24 hr tablet Take 100 mg by mouth daily. Patient not taking: Reported on 04/06/2023 10/06/22   [provider]  Naphazoline HCl (CLEAR EYES OP) Place 1 drop into both eyes daily as needed (irritation).    [provider]  [facility] ULTRA test strip 1 each by Other route 2 (two) times daily. as directed 04/28/23   [name], MD  pantoprazole (PROTONIX) 40 MG tablet TAKE 1 TABLET BY MOUTH EVERY DAY 03/03/23   [name], MD  Rivaroxaban (XARELTO) 15 MG TABS tablet Take 1 tablet (15 mg total) by mouth daily with supper. 12/23/22   Jacklynn Ganong, FNP  spironolactone (ALDACTONE) 25 MG tablet Take 1 tablet (25 mg total) by mouth daily. 12/16/22   Bensimhon, [name], MD  torsemide (DEMADEX) 20 MG tablet Take 1 tablet (20 mg total) by mouth daily. 12/28/22   Jacklynn Ganong, FNP  TRELEGY ELLIPTA 100-62.5-25 MCG/ACT AEPB Inhale ONE PUFF into THE lungs daily 06/27/22  [name], MD  losartan (COZAAR) 50 MG tablet Take 1 tablet (50 mg total) by mouth daily. 12/16/22   Bensimhon, [name], MD      Allergies    Bee venom, Ivp dye [iodinated contrast media], Ace inhibitors, Atenolol, Codeine, Entresto [sacubitril-valsartan], Isosorbide, Shrimp [shellfish allergy], Simvastatin, Jardiance [empagliflozin], and Penicillin g    Review of Systems   Review of Systems  Cardiovascular:  Positive for chest pain.  All other systems reviewed and are negative.   Physical Exam Updated Vital Signs BP (!) 153/89   Pulse 66   Temp 98.7 F (37.1 C)   Resp 15    SpO2 100%  Physical Exam Vitals and nursing note reviewed.  Constitutional:      General: She is not in acute distress.    Appearance: She is well-developed.  HENT:     Head: Normocephalic and atraumatic.  Eyes:     Conjunctiva/sclera: Conjunctivae normal.  Cardiovascular:     Rate and Rhythm: Normal rate and regular rhythm.     Heart sounds: No murmur heard. Pulmonary:     Effort: Pulmonary effort is normal. No tachypnea, accessory muscle usage or respiratory distress.     Breath sounds: Normal breath sounds.  Abdominal:     Palpations: Abdomen is soft.     Tenderness: There is no abdominal tenderness.  Musculoskeletal:        General: No swelling. Normal range of motion.     Cervical back: Neck supple.     Comments: TTP along left shoulder and left chest wall  Skin:    General: Skin is warm and dry.     Capillary Refill: Capillary refill takes less than 2 seconds.  Neurological:     Mental Status: She is alert.  Psychiatric:        Mood and Affect: Mood normal.     ED Results / Procedures / Treatments   Labs (all labs ordered are listed, but only abnormal results are displayed) Labs Reviewed  BASIC METABOLIC PANEL - Abnormal; Notable for the following components:      Result Value   Chloride 112 (*)    CO2 20 (*)    Creatinine, Ser 1.25 (*)    GFR, Estimated 44 (*)    All other components within normal limits  CBC - Abnormal; Notable for the following components:   RDW 15.9 (*)    Platelets 111 (*)    All other components within normal limits  BRAIN NATRIURETIC PEPTIDE - Abnormal; Notable for the following components:   B Natriuretic Peptide 295.2 (*)    All other components within normal limits  TROPONIN I (HIGH SENSITIVITY) - Abnormal; Notable for the following components:   Troponin I (High Sensitivity) 25 (*)    All other components within normal limits  TROPONIN I (HIGH SENSITIVITY) - Abnormal; Notable for the following components:   Troponin I (High  Sensitivity) 24 (*)    All other components within normal limits  RESP PANEL BY RT-PCR (RSV, FLU A&B, COVID)  [id number]    EKG EKG Interpretation Date/Time:  Monday June 12 2023 13:29:22 EDT Ventricular Rate:  67 PR Interval:    QRS Duration:  100 QT Interval:  396 QTC Calculation: 418 R Axis:   42  Text Interpretation: Accelerated Junctional rhythm with frequent ventricular-paced complexes and Premature supraventricular complexes ST & T wave abnormality, consider inferolateral ischemia Abnormal ECG When compared with ECG of 25-Apr-2023 14:12, PREVIOUS ECG IS PRESENT Confirmed by Estelle June (  [id number]) on 06/12/2023 3:39:00 PM  Radiology DG Chest 2 View  Result Date: 06/12/2023 CLINICAL DATA:  Chest pain. EXAM: CHEST - 2 VIEW COMPARISON:  01/11/2023. FINDINGS: Redemonstration of linear area of atelectasis/scarring overlying the left lower lung zone. Bilateral lung fields are otherwise clear. No acute consolidation or lung collapse. Bilateral costophrenic angles are clear. Stable mildly enlarged cardio-mediastinal silhouette. Sternotomy wires noted, status post CABG. There is a left-sided ICD. Left atrial appendage closure device noted. No acute osseous abnormalities. The soft tissues are within normal limits. IMPRESSION: *No acute cardiopulmonary disease. Electronically Signed   By: Jules Schick M.D.   On: 06/12/2023 15:45    Procedures Procedures   Medications Ordered in ED Medications - No data to display  ED Course/ Medical Decision Making/ A&P                               Medical Decision Making Amount and/or Complexity of Data Reviewed Labs: ordered. Radiology: ordered.   This patient presents to the ED for concern of chest pain.  Differential diagnosis includes ACS, PE, viral URI, pneumonia   Lab Tests:  I Ordered, and personally interpreted labs.  The pertinent results include: CBC unremarkable, BMP unremarkable, troponin 25 point close the patient's baseline,  delta troponin slightly improved at 24, BNP elevated at 295 but no signs of acute CHF exacerbation   Imaging Studies ordered:  I ordered imaging studies including chest x-ray I independently visualized and interpreted imaging which showed no acute cardiopulmonary process I agree with the radiologist interpretation  Problem List / ED Course:  Patient past history significant for hypertension, recurrent atrial fibrillation, coronary artery disease status post CABG x 2, congestive heart failure, ischemic cardiomyopathy, hyperlipidemia, Biotronik PPM here with concerns of chest pain.  Endorses that she has been expressing left-sided chest pain with radiation into the left shoulder and left arm off and on for several days.  Also endorsing some mild associated shortness of breath but is also currently dealing with a cold and has associated cough and congestion.  Denies any fever, chills, body aches.  Currently sees Dr. Gala Romney for cardiology.  Currently rates chest pain 0 out of 10. No nausea, vomiting, or diaphoresis.  Cardiac workup initiated for patient's symptoms including CBC, BMP, troponin, BNP, EKG and chest x-ray. CBC, BMP are largely unremarkable with baseline renal impairment shown.  Respiratory panel is also negative for COVID-19, influenza and RSV.  Initial troponin elevated at 25 which is close to patient's baseline.  Will trend troponin to evaluate if troponin continues elevated.  BNP also results back slightly elevated at 295.2 which is hovering close to patient's baseline. EKG shows accelerated junctional rhythm with ventricular pacing which is patient's baseline.  Given the patient has not had any acute worsening in symptoms and feels that she is not experiencing chest pain but instead likely dealing with residual viral process, I believe that she is stable for discharge home with outpatient cardiology follow-up.  Patient's heart score is at moderate level so discussed risk versus benefit  of admission versus outpatient follow-up patient would prefer outpatient follow-up and discharge home.  I believe that this is an appropriate plan for patient but to discuss strict return precautions.  Patient in agreement with plan and referral to cardiology placed for repeat assessment by cardiology.  No other acute concerns at this time.  Patient discharged home in stable condition.  Final Clinical Impression(s) / ED Diagnoses  Final diagnoses:  Atypical chest pain    Rx / DC Orders ED Discharge Orders          Ordered    Ambulatory referral to Cardiology       Comments: If you have not heard from the Cardiology office within the next 72 hours please call [phone].   06/12/23 1927              Smitty Knudsen, PA-C 06/13/23 2339    Royanne Foots, DO 06/15/23 [phone]

## 2023-06-12 NOTE — Discharge Instructions (Signed)
You were seen in the ER today with concerns of chest pain. Your troponin level was elevated but close to your normal baseline. After discussion, decision was made to have you follow up with your cardiologist outpatient but continue all your medications as prescribed. Return to the ER if symptoms are worsening.

## 2023-06-12 NOTE — ED Triage Notes (Signed)
Pt here from home with c/o left sided chest pain radiates to her left shoulder , some slight sob abut states that she has had a cold , no n/v

## 2023-06-16 ENCOUNTER — Other Ambulatory Visit: Payer: Self-pay | Admitting: Internal Medicine

## 2023-06-16 ENCOUNTER — Telehealth (HOSPITAL_COMMUNITY): Payer: Self-pay | Admitting: Emergency Medicine

## 2023-06-16 ENCOUNTER — Other Ambulatory Visit (HOSPITAL_COMMUNITY): Payer: Self-pay | Admitting: *Deleted

## 2023-06-16 MED ORDER — ALLOPURINOL 100 MG PO TABS: 100.0000 mg | ORAL_TABLET | Freq: Every day | ORAL | 6 refills | Status: DC

## 2023-06-16 NOTE — Telephone Encounter (Signed)
Called [name] [name] to confirm today's appointment for 2:00 and she advised she did not have time today.  She did request a refill on her Allopurinol and I sent message to HF triage for same.    [name], EMT-Paramedic [phone] 06/16/2023

## 2023-06-18 ENCOUNTER — Emergency Department (HOSPITAL_COMMUNITY)
Admission: EM | Admit: 2023-06-18 | Discharge: 2023-06-18 | Disposition: A | Discharge status: Home / Self Care | Payer: Medicare Other | Attending: Emergency Medicine | Admitting: Emergency Medicine

## 2023-06-18 ENCOUNTER — Emergency Department (HOSPITAL_COMMUNITY): Payer: Medicare Other

## 2023-06-18 DIAGNOSIS — R609 Edema, unspecified: Secondary | ICD-10-CM | POA: Diagnosis not present

## 2023-06-18 DIAGNOSIS — N189 Chronic kidney disease, unspecified: Secondary | ICD-10-CM | POA: Insufficient documentation

## 2023-06-18 DIAGNOSIS — S8251XA Displaced fracture of medial malleolus of right tibia, initial encounter for closed fracture: Secondary | ICD-10-CM | POA: Diagnosis not present

## 2023-06-18 DIAGNOSIS — I130 Hypertensive heart and chronic kidney disease with heart failure and stage 1 through stage 4 chronic kidney disease, or unspecified chronic kidney disease: Secondary | ICD-10-CM | POA: Diagnosis not present

## 2023-06-18 DIAGNOSIS — S82831A Other fracture of upper and lower end of right fibula, initial encounter for closed fracture: Secondary | ICD-10-CM | POA: Diagnosis not present

## 2023-06-18 DIAGNOSIS — Z7901 Long term (current) use of anticoagulants: Secondary | ICD-10-CM | POA: Diagnosis not present

## 2023-06-18 DIAGNOSIS — I10 Essential (primary) hypertension: Secondary | ICD-10-CM | POA: Diagnosis not present

## 2023-06-18 DIAGNOSIS — I509 Heart failure, unspecified: Secondary | ICD-10-CM | POA: Diagnosis not present

## 2023-06-18 DIAGNOSIS — W19XXXA Unspecified fall, initial encounter: Secondary | ICD-10-CM | POA: Diagnosis not present

## 2023-06-18 DIAGNOSIS — I2510 Atherosclerotic heart disease of native coronary artery without angina pectoris: Secondary | ICD-10-CM | POA: Insufficient documentation

## 2023-06-18 DIAGNOSIS — Z951 Presence of aortocoronary bypass graft: Secondary | ICD-10-CM | POA: Insufficient documentation

## 2023-06-18 DIAGNOSIS — W010XXA Fall on same level from slipping, tripping and stumbling without subsequent striking against object, initial encounter: Secondary | ICD-10-CM | POA: Insufficient documentation

## 2023-06-18 DIAGNOSIS — M25571 Pain in right ankle and joints of right foot: Secondary | ICD-10-CM | POA: Diagnosis present

## 2023-06-18 DIAGNOSIS — S82431A Displaced oblique fracture of shaft of right fibula, initial encounter for closed fracture: Secondary | ICD-10-CM | POA: Diagnosis not present

## 2023-06-18 DIAGNOSIS — Z743 Need for continuous supervision: Secondary | ICD-10-CM | POA: Diagnosis not present

## 2023-06-18 DIAGNOSIS — S82891A Other fracture of right lower leg, initial encounter for closed fracture: Secondary | ICD-10-CM

## 2023-06-18 DIAGNOSIS — J449 Chronic obstructive pulmonary disease, unspecified: Secondary | ICD-10-CM | POA: Diagnosis not present

## 2023-06-18 DIAGNOSIS — Y92009 Unspecified place in unspecified non-institutional (private) residence as the place of occurrence of the external cause: Secondary | ICD-10-CM | POA: Insufficient documentation

## 2023-06-18 MED ORDER — OXYCODONE-ACETAMINOPHEN 5-325 MG PO TABS: 1.0000 | ORAL_TABLET | Freq: Four times a day (QID) | ORAL | 0 refills | Status: DC | PRN

## 2023-06-18 MED ORDER — OXYCODONE-ACETAMINOPHEN 5-325 MG PO TABS
1.0000 | ORAL_TABLET | Freq: Once | ORAL | Status: AC
  Administered 2023-06-18: 1 via ORAL
  Filled 2023-06-18: qty 1

## 2023-06-18 NOTE — ED Provider Notes (Signed)
[place] EMERGENCY DEPARTMENT AT [facility] Provider Note   CSN: [id number] Arrival date & time: 06/18/23  0865     History  Chief Complaint  Patient presents with   Fall   Ankle Pain    [name] [name] is a 77 y.o. female.  The history is provided by the patient and medical records. No language interpreter was used.  Fall This is a new problem. The current episode started 12 to 24 hours ago. The problem occurs rarely. Pertinent negatives include no chest pain, no abdominal pain, no headaches and no shortness of breath. Nothing aggravates the symptoms. Nothing relieves the symptoms. She has tried nothing for the symptoms. The treatment provided no relief.  Ankle Pain Location:  Ankle Time since incident:  1 day Injury: yes   Mechanism of injury: fall   Ankle location:  R ankle Pain details:    Quality:  Aching   Radiates to:  Does not radiate   Severity:  Severe   Onset quality:  Sudden   Timing:  Constant   Progression:  Unchanged Associated symptoms: no back pain, no fatigue, no fever and no neck pain        Home Medications Prior to Admission medications   Medication Sig Start Date End Date Taking? Authorizing Provider  acetaminophen (TYLENOL) 325 MG tablet Take 2 tablets (650 mg total) by mouth every 4 (four) hours as needed for headache or mild pain. 10/11/22   [name], MD  Alcohol Swabs (ALCOHOL PADS) 70 % PADS 1 Act by Does not apply route 2 (two) times daily. 04/28/23   [name], MD  allopurinol (ZYLOPRIM) 100 MG tablet Take 1 tablet (100 mg total) by mouth daily. 06/16/23   Bensimhon, [name], MD  atorvastatin (LIPITOR) 10 MG tablet TAKE 1 TABLET BY MOUTH EVERYDAY AT BEDTIME 03/03/23   [name], MD  Blood Glucose Monitoring Suppl (ONE TOUCH ULTRA 2) w/Device KIT 1 Act by Does not apply route 2 (two) times daily. 04/28/23   [name], MD  gabapentin (NEURONTIN) 100 MG capsule TAKE 1 CAPSULE BY MOUTH EVERYDAY AT BEDTIME 05/16/23    Vivi Barrack, DPM  hydrALAZINE (APRESOLINE) 25 MG tablet TAKE 2 TABLETS (50 MG TOTAL) BY MOUTH 3 (THREE) TIMES DAILY. 02/03/23   Bensimhon, [name], MD  losartan (COZAAR) 100 MG tablet Take 1 tablet (100 mg total) by mouth daily. 04/06/23   Bensimhon, [name], MD  metFORMIN (GLUCOPHAGE) 500 MG tablet Take 1 tablet (500 mg total) by mouth daily with breakfast. 03/03/23   [name], MD  metoprolol succinate (TOPROL-XL) 100 MG 24 hr tablet Take 100 mg by mouth daily. Patient not taking: Reported on 04/06/2023 10/06/22   [provider]  Naphazoline HCl (CLEAR EYES OP) Place 1 drop into both eyes daily as needed (irritation).    [provider]  [facility] ULTRA test strip 1 each by Other route 2 (two) times daily. as directed 04/28/23   [name], MD  pantoprazole (PROTONIX) 40 MG tablet TAKE 1 TABLET BY MOUTH EVERY DAY 03/03/23   [name], MD  Rivaroxaban (XARELTO) 15 MG TABS tablet Take 1 tablet (15 mg total) by mouth daily with supper. 12/23/22   Jacklynn Ganong, FNP  spironolactone (ALDACTONE) 25 MG tablet Take 1 tablet (25 mg total) by mouth daily. 12/16/22   Bensimhon, [name], MD  torsemide (DEMADEX) 20 MG tablet Take 1 tablet (20 mg total) by mouth  daily. 12/28/22   Jacklynn Ganong, FNP  TRELEGY ELLIPTA 100-62.5-25 MCG/ACT AEPB Inhale ONE PUFF into THE lungs daily 06/27/22   [name], MD  losartan (COZAAR) 50 MG tablet Take 1 tablet (50 mg total) by mouth daily. 12/16/22   Bensimhon, [name], MD      Allergies    Bee venom, Ivp dye [iodinated contrast media], Ace inhibitors, Atenolol, Codeine, Entresto [sacubitril-valsartan], Isosorbide, Shrimp [shellfish allergy], Simvastatin, Jardiance [empagliflozin], and Penicillin g    Review of Systems   Review of Systems  Constitutional:  Negative for chills, fatigue and fever.  HENT:  Negative for congestion.   Respiratory:  Negative for cough, chest tightness, shortness of breath and wheezing.    Cardiovascular:  Negative for chest pain and palpitations.  Gastrointestinal:  Negative for abdominal pain, constipation, diarrhea, nausea and vomiting.  Genitourinary:  Negative for dysuria and flank pain.  Musculoskeletal:  Negative for back pain, neck pain and neck stiffness.  Skin:  Positive for wound. Negative for rash.  Neurological:  Negative for light-headedness and headaches.  Psychiatric/Behavioral:  Negative for agitation.   All other systems reviewed and are negative.   Physical Exam Updated Vital Signs BP (!) 154/111 (BP Location: Left Arm)   Pulse 63   Temp 98.2 F (36.8 C) (Oral)   Resp 18   SpO2 99%  Physical Exam Vitals and nursing note reviewed.  Constitutional:      General: She is not in acute distress.    Appearance: She is well-developed. She is not ill-appearing, toxic-appearing or diaphoretic.  HENT:     Head: Normocephalic and atraumatic.     Nose: Nose normal.     Mouth/Throat:     Mouth: Mucous membranes are moist.  Eyes:     Conjunctiva/sclera: Conjunctivae normal.  Cardiovascular:     Rate and Rhythm: Normal rate and regular rhythm.     Pulses: Normal pulses.     Heart sounds: No murmur heard. Pulmonary:     Effort: Pulmonary effort is normal. No respiratory distress.     Breath sounds: Normal breath sounds. No wheezing, rhonchi or rales.  Chest:     Chest wall: No tenderness.  Abdominal:     Palpations: Abdomen is soft.     Tenderness: There is no abdominal tenderness. There is no guarding or rebound.  Musculoskeletal:        General: Swelling, tenderness and signs of injury present.     Cervical back: Neck supple. No tenderness.     Right ankle: Swelling present. No lacerations. Tenderness present.       Legs:     Comments: Tenderness and swelling to right ankle.  Intact sensation and pulses.  Good cap refill.  Can wiggle toes.  Mild abrasion just below the knee but minimal tenderness.  No crepitance.  No other hip tenderness or  traumatic injury seen.  Skin:    General: Skin is warm and dry.     Capillary Refill: Capillary refill takes less than 2 seconds.     Findings: No bruising or rash.  Neurological:     General: No focal deficit present.     Mental Status: She is alert.     Sensory: No sensory deficit.     Motor: No weakness.  Psychiatric:        Mood and Affect: Mood normal.     ED Results / Procedures / Treatments   Labs (all labs ordered are listed, but only abnormal results are displayed)  Labs Reviewed - No data to display  EKG None  Radiology DG Ankle Complete Right  Result Date: 06/18/2023 CLINICAL DATA:  Ankle pain after injury this morning EXAM: RIGHT ANKLE - COMPLETE 3 VIEW COMPARISON:  None Available. FINDINGS: Horizontal fracture of the medial malleolus. Oblique fracture through the distal fibular shaft. No dislocation. Subjective osteopenia. Expected regional soft tissue swelling. IMPRESSION: Medial malleolus and distal fibular fractures. Electronically Signed   By: Tiburcio Pea M.D.   On: 06/18/2023 08:09    Procedures Procedures    Medications Ordered in ED Medications - No data to display  ED Course/ Medical Decision Making/ A&P                                 Medical Decision Making Amount and/or Complexity of Data Reviewed Radiology: ordered.  Risk Prescription drug management.    ELLERIE ARENZ is a 77 y.o. female with a past medical history significant for hypertension, asthma, CAD status post CABG, CKD, GERD, COPD, CHF, paroxysmal atrial fibrillation on Xarelto, previous esophageal dilation, and previous GI bleed who presents for ankle injury.  According to patient, last evening about 5:30 PM she slipped on some juice from a person and fell on the ground.  She did not hit her head or get knocked out and is only complaining of pain in her right ankle and abrasion on her right knee.  She reports there was deformity and swelling and has not been able to bear weight  since.  She reports the pain is moderate to severe.  She tried taking a Tylenol overnight that did not seem to help too much.  On exam, lungs clear.  Chest nontender.  Abdomen nontender.  Hips and knees nontender.  Small abrasion slightly tender just below the knee but she did not want imaging of the knee.  She has tenderness and swelling to the right ankle but has intact sensation and can wiggle her toes.  Good cap refill good pulses.  No laceration seen on the ankle.  X-ray obtained in triage showing ankle fracture with distal fibular and medial malleolus fracture.  Patient has previously seen Dr. Bobby Rumpf with [facility] so we will call [facility] to confirm which provider would be best to call for follow-up and best splinting plan for her.  Otherwise anticipate crutches, splinting, and pain medicine and discharge for outpatient follow-up.  9:15 AM Spoke to Dr. Shon Baton with [facility] who reviewed the case and images.  He recommended a posterior ankle splint with stirrups and immobilization with crutches or walker.  If patient was able to safely ambulate he agreed with discharge home and he would help arrange evaluation in the next 2 days with one of his colleagues, ideally Dr. Odis Hollingshead but she could go home.  If she fails ambulatory evaluation, she will need to be admitted by medical service given all her other medical problems to discuss inpatient management.  10:53 AM Splint was applied and patient had intact cap refill and sensation and can wiggle her toes.  Patient does feel she can manage at home with a walker so we will discharge home with 1.  She agrees with attempt at home management of the fracture and will try to talk to orthopedics to get seen in the next day or 2 as planned.  She understand strict return precautions and if things were to change or worsen, she will return for likely medical admission for further  management of her fracture.        Final Clinical Impression(s) / ED  Diagnoses Final diagnoses:  Fall in home, initial encounter  Closed fracture of right ankle, initial encounter    Rx / DC Orders ED Discharge Orders          Ordered    oxyCODONE-acetaminophen (PERCOCET/ROXICET) 5-325 MG tablet  Every 6 hours PRN        06/18/23 1055            Clinical Impression: 1. Fall in home, initial encounter   2. Closed fracture of right ankle, initial encounter     Disposition: Discharge  Condition: Good  I have discussed the results, Dx and Tx plan with the pt(& family if present). He/she/they expressed understanding and agree(s) with the plan. Discharge instructions discussed at great length. Strict return precautions discussed and pt &/or family have verbalized understanding of the instructions. No further questions at time of discharge.    New Prescriptions   OXYCODONE-ACETAMINOPHEN (PERCOCET/ROXICET) 5-325 MG TABLET    Take 1 tablet by mouth every 6 (six) hours as needed for severe pain (pain score 7-10).    Follow Up: Netta Cedars, MD [address] [place] [address] [phone]   with orthopedic surgery  [facility] Emergency Department at [facility] [address] [place] [address] [phone]        Florinda Taflinger, Canary Brim, MD 06/18/23 1057

## 2023-06-18 NOTE — Progress Notes (Signed)
Orthopedic Tech Progress Note Patient Details:  [name] [name] Sep 30, 1945 [id number]  Ortho Devices Type of Ortho Device: Post (short leg) splint, Stirrup splint Ortho Device/Splint Location: RLE Ortho Device/Splint Interventions: Ordered, Application, Adjustment   Post Interventions Patient Tolerated: Well Instructions Provided: Care of device  Tonye Pearson 06/18/2023, 10:31 AM

## 2023-06-18 NOTE — Discharge Instructions (Signed)
Your history, exam, and workup today revealed a right ankle fracture of 2 different bones in your ankle.  I spoke with the orthopedics team who recommended splinting and attempt at discharge to get seen in clinic in the next few days.  He recommended follow-up with Dr. Odis Hollingshead with [facility].  Please call their office tomorrow if you do not hear from them for an appointment and please use the pain medicine to help with the discomfort.  Please keep it elevated and rest.  Please be careful not to fall again.  We had a shared decision-making conversation discussing admission versus discharge home and agree with attempted discharge home.  If symptoms were to change or worsen, return to the nearest emergency department as she would likely admission for further management.

## 2023-06-18 NOTE — Progress Notes (Signed)
Orthopedic Tech Progress Note Patient Details:  JANEKA LIBMAN Jun 01, 1946 [id number]  Patient ID: [name], female   DOB: [birth year], 77 y.o.   MRN: [id number] Pt. Unable to ambulate safely with crutches. Pt. Nurse notified of this situation. Tonye Pearson 06/18/2023, 10:32 AM

## 2023-06-18 NOTE — ED Triage Notes (Addendum)
Patient here from home reporting slip and fall on juice in the floor. Reports right sided ankle pain with deformity and swelling noted. No LOC, denies hitting head.

## 2023-06-19 DIAGNOSIS — M25571 Pain in right ankle and joints of right foot: Secondary | ICD-10-CM | POA: Insufficient documentation

## 2023-06-20 ENCOUNTER — Other Ambulatory Visit: Payer: Self-pay | Admitting: Orthopaedic Surgery

## 2023-06-20 ENCOUNTER — Telehealth: Payer: Self-pay | Admitting: *Deleted

## 2023-06-20 ENCOUNTER — Telehealth (HOSPITAL_COMMUNITY): Payer: Self-pay | Admitting: Emergency Medicine

## 2023-06-20 ENCOUNTER — Ambulatory Visit
Admission: RE | Admit: 2023-06-20 | Discharge: 2023-06-20 | Disposition: A | Discharge status: Home / Self Care | Payer: Medicare Other | Source: Ambulatory Visit | Attending: Orthopaedic Surgery

## 2023-06-20 DIAGNOSIS — M25571 Pain in right ankle and joints of right foot: Secondary | ICD-10-CM

## 2023-06-20 DIAGNOSIS — S82841A Displaced bimalleolar fracture of right lower leg, initial encounter for closed fracture: Secondary | ICD-10-CM | POA: Diagnosis not present

## 2023-06-20 DIAGNOSIS — S82851A Displaced trimalleolar fracture of right lower leg, initial encounter for closed fracture: Secondary | ICD-10-CM | POA: Diagnosis not present

## 2023-06-20 NOTE — Telephone Encounter (Signed)
Returned [name] [name]' call.  She had called to let me know that she slipped and fell in the kitchen on Sunday and broke her foot.  She is seeing orthopedics today.  I advised her to reach out if she needs anything.    [name], EMT-Paramedic [phone] 06/20/2023

## 2023-06-20 NOTE — Telephone Encounter (Signed)
Please advise holding Xarelto prior to right ankle bimalleolar orif, possible syndesmosis and/or deltoid fixation, possible allograft, possible external fixation on 11/11   Thank you!  DW

## 2023-06-20 NOTE — Telephone Encounter (Signed)
Patient with diagnosis of afib on Xarelto for anticoagulation.    Procedure: right ankle bimalleolar orif, possible syndesmosis and/or deltoid fixation, possible allograft, possible external fixation  Date of procedure: 06/26/23   CHA2DS2-VASc Score = 6   This indicates a 9.7% annual risk of stroke. [name] score is based upon: CHF History: 1 HTN History: 1 Diabetes History: 1 Stroke History: 0 Vascular Disease History: 0 Age Score: 2 Gender Score: 1      CrCl 46 ml/min Platelet count 111  Per office protocol, patient can hold Xarelto for 3 days prior to procedure.    **This guidance is not considered finalized until pre-operative APP has relayed final recommendations.**

## 2023-06-20 NOTE — Telephone Encounter (Signed)
Dr. Gala Romney,  You saw this patient on 04/06/2023. Per office protocol, will you please comment on medical clearance for right ankle bimalleolar orif, possible syndesmosis and/or deltoid fixation, possible allograft, possible external fixation on 11/11?  Please route your response to P CV DIV Preop. I will communicate with requesting office once you have given recommendations.   Thank you!  Carlos Levering, NP

## 2023-06-20 NOTE — Telephone Encounter (Signed)
   Pre-operative Risk Assessment    Patient Name: [name] [name]  DOB: 1946-01-24 MRN: [id number]    DATE OF LAST VISIT: 04/06/23 DR. Gala Romney DATE OF NEXT VISIT: 07/07/23 HEART FAILURE PA/NP  Request for Surgical Clearance    Procedure:   RIGHT ANKLE BIMALLEOLAR ORIF, POSSIBLE SYNDESMOSIS AND/OR DELTOID FIXATION, POSSIBLE ALLOGRAFT, POSSIBLE EXTERNAL FIXATION  Date of Surgery:  Clearance 06/26/23                                 Surgeon:  DR. Netta Cedars Surgeon's Group or Practice Name:  [facility] Phone number:  [phone] ATTN: MEGAN Vandenbos Fax number:  [phone]   Type of Clearance Requested:   - Medical  - Pharmacy:  Hold Rivaroxaban (Xarelto)     Type of Anesthesia:   CHOICE   Additional requests/questions:    [name]   06/20/2023, 10:20 AM

## 2023-06-21 ENCOUNTER — Encounter: Payer: Self-pay | Admitting: Internal Medicine

## 2023-06-21 NOTE — Progress Notes (Signed)
For Anesthesia: PCP: [name], MD Primary Cardiologist: Dr. Anne Fu clearance note 06/21/23 in [facility] EP: Dr Lewayne Bunting HF MD: Dr . Arvilla Meres  Pulmonologist- Delton Coombes, Les Pou, MD   Chest x-ray - 06/12/23 in [facility] EKG - 06/12/23 in [facility] Stress Test - greater than 2 years in [facility] ECHO - 04/06/23 in [facility] Cardiac Cath - 06/08/22 Pacemaker/ICD device last checked: 04/18/23 in [facility] Pacemaker orders received:Yes in [facility] Device Rep notified: N/A  Spinal Cord Stimulator: N/A  Sleep Study - Yes CPAP - N/A  Fasting Blood Sugar - 117 Checks Blood Sugar _1____ times every other day Date and result of last Hgb A1c-  Last dose of GLP1 agonist- N/A GLP1 instructions: N/A  Last dose of SGLT-2 inhibitors- N/A SGLT-2 instructions:N/A  Blood Thinner Instructions: Xarelto hold Xarelto 3 days prior to procedure (per note 06/21/23 in [facility]) Aspirin Instructions:N/A Last Dose:N/A  Activity level: Can go up a flight of stairs and activities of daily living without stopping and without chest pain and/or shortness of breath    Anesthesia review: HTN, CKDIII, Ischemic cardiomyopathy, CHF, history of cardiac arrest, PAF, CAD, EF 25-30 % last echo 04/06/23, History of CABG, COPD  Patient denies shortness of breath, fever, cough and chest pain during pre op phone call.   Patient verbalized understanding of instructions reviewed via telephone.

## 2023-06-21 NOTE — Progress Notes (Signed)
PERIOPERATIVE PRESCRIPTION FOR IMPLANTED CARDIAC DEVICE PROGRAMMING  Patient Information: Name:  NORITA MEIGS  DOB:  [birth year]  MRN:  [id number]    Planned Procedure:  R ORIF ankle fracture  Surgeon:  Odis Hollingshead  Date of Procedure:  06/26/2023  Cautery will be used.  Position during surgery:     Device Information:  Clinic EP Physician:  Lewayne Bunting, MD   Device Type:  Pacemaker Manufacturer and Phone #:  [employer]: [phone] Pacemaker Dependent?:  Yes.  Intermittently  Date of Last Device Check:  03/15/2023 in office, 04/18/2023 remote. Normal Device Function?:  Yes.    Electrophysiologist's Recommendations:  Have magnet available. Provide continuous ECG monitoring when magnet is used or reprogramming is to be performed.  Procedure may interfere with device function.  Magnet should be placed over device during procedure.  Per Device Clinic Standing Orders, Kizzie Ide, RN  3:25 PM 06/21/2023

## 2023-06-21 NOTE — Telephone Encounter (Addendum)
   Patient Name: [name] [name]  DOB: [birth year] MRN: [id number]  Primary Cardiologist: Donato Schultz, MD  Chart reviewed as part of pre-operative protocol coverage. Given past medical history and time since last visit, based on ACC/AHA guidelines, MARIANGEL RINGLEY at moderate to high risk but okay to proceed as long as she is aware of her risk per Dr. Gala Romney.  Please also avoid general anesthesia if possible.  The patient was advised that if she develops new symptoms prior to surgery to contact our office to arrange for a follow-up visit, and she verbalized understanding.  Patient can hold Xarelto 3 days prior to procedure  I will route this recommendation to the requesting party via Epic fax function and remove from pre-op pool.  Please call with questions.  Napoleon Form, Leodis Rains, NP 06/21/2023, 11:22 AM

## 2023-06-22 ENCOUNTER — Encounter (HOSPITAL_COMMUNITY): Payer: Self-pay | Admitting: Orthopaedic Surgery

## 2023-06-22 ENCOUNTER — Other Ambulatory Visit: Payer: Self-pay

## 2023-06-22 NOTE — Anesthesia Preprocedure Evaluation (Addendum)
Anesthesia Evaluation  Patient identified by MRN, date of birth, ID band Patient awake    Reviewed: Allergy & Precautions, NPO status , Patient's Chart, lab work & pertinent test results, reviewed documented beta blocker date and time   History of Anesthesia Complications Negative for: history of anesthetic complications  Airway Mallampati: III  TM Distance: >3 FB     Dental  (+) Upper Dentures   Pulmonary asthma , sleep apnea , pneumonia, COPD, Current Smoker and Patient abstained from smoking.   breath sounds clear to auscultation       Cardiovascular hypertension, Pt. on medications + angina  + CAD, + CABG and +CHF (systolic HF, EF 25-30%)  + dysrhythmias Atrial Fibrillation + pacemaker + Valvular Problems/Murmurs MR  Rhythm:Irregular Rate:Normal     Neuro/Psych  Headaches    GI/Hepatic ,GERD  ,,(+) neg Cirrhosis        Endo/Other  diabetes, Type 2    Renal/GU CRFRenal disease     Musculoskeletal  (+) Arthritis ,    Abdominal   Peds  Hematology  (+) Blood dyscrasia (thrombocytopenia)   Anesthesia Other Findings   Reproductive/Obstetrics                              Anesthesia Physical Anesthesia Plan  ASA: 3  Anesthesia Plan: General   Post-op Pain Management: Regional block*   Induction: Intravenous  PONV Risk Score and Plan: 2 and Ondansetron and Dexamethasone  Airway Management Planned: Oral ETT  Additional Equipment:   Intra-op Plan:   Post-operative Plan: Extubation in OR  Informed Consent: I have reviewed the patients History and Physical, chart, labs and discussed the procedure including the risks, benefits and alternatives for the proposed anesthesia with the patient or authorized representative who has indicated his/her understanding and acceptance.     Dental advisory given  Plan Discussed with: CRNA  Anesthesia Plan Comments: (See PAT note 06/22/2023)         Anesthesia Quick Evaluation

## 2023-06-22 NOTE — Progress Notes (Signed)
Anesthesia Chart Review   Case: [id number] Date/Time: 06/26/23 1130   Procedures:      OPEN REDUCTION INTERNAL FIXATION (ORIF) BIMALLEOLAR ANKLE FRACTURE (Right: Ankle)     POSSIBLE SYNDESMOSIS REPAIR (Right)     POSSIBLE ALLOGRAFT APPLICATION (Right)     POSSIBLE EXTERNAL FIXATION LEG (Right)   Anesthesia type: Choice   Pre-op diagnosis: Closed bimalleolar fracture of right ankle   Location: [facility] ROOM 08 / [facility]   Surgeons: Netta Cedars, MD       DISCUSSION:77 y.o. smoker with h/o HTN, COPD, CHF, CAD (CABG 2016), PAF, pacemaker in place (device orders in 06/21/2023 progress note), CKD Stage III, closed bimalleolar fracture of right ankle scheduled for above procedure 06/26/2023 with Dr. Netta Cedars.   Per cardiology preoperative evaluation 06/21/2023, "Chart reviewed as part of pre-operative protocol coverage. Given past medical history and time since last visit, based on ACC/AHA guidelines, [name] [name] at moderate to high risk but okay to proceed as long as she is aware of her risk per Dr. Gala Romney.  Please also avoid general anesthesia if possible.   The patient was advised that if she develops new symptoms prior to surgery to contact our office to arrange for a follow-up visit, and she verbalized understanding.   Patient can hold Xarelto 3 days prior to procedure"  VS: Ht 5' 7.5" (1.715 m)   Wt 78.5 kg   BMI 26.70 kg/m   PROVIDERS: [name], MD   LABS: Labs reviewed: Acceptable for surgery. (all labs ordered are listed, but only abnormal results are displayed)  Labs Reviewed - No data to display   IMAGES:   EKG:   CV: Echo 04/06/23 1. Left ventricular ejection fraction, by estimation, is 25 to 30%. The  left ventricle has severely decreased function. The left ventricle  demonstrates global hypokinesis. There is severe asymmetric left  ventricular hypertrophy. Left ventricular  diastolic parameters are indeterminate.   2. Right ventricular  systolic function is moderately reduced. The right  ventricular size is severely enlarged. Mildly increased right ventricular  wall thickness. There is normal pulmonary artery systolic pressure.   3. Left atrial size was severely dilated.   4. The mitral valve is abnormal. Mild to moderate mitral valve  regurgitation. No evidence of mitral stenosis.   5. The aortic valve is tricuspid. There is moderate calcification of the  aortic valve. Aortic valve regurgitation is trivial. Aortic valve  sclerosis is present, with no evidence of aortic valve stenosis.   Comparison(s): Prior images reviewed side by side. TR, RVSP, and MR have  improved.   Myocardial Perfusion 09/07/2018 Nuclear stress EF: 44%. There was no ST segment deviation noted during stress. The study is normal. This is a low risk study. The left ventricular ejection fraction is moderately decreased (30-44%). Past Medical History:  Diagnosis Date   (HFimpEF) heart failure with improved ejection fraction (HCC)    a. 07/2018 Echo: EF 30-35%; b. 11/2019 Echo: EF 55-60%, no rwma, Gr2 DD, Nl RV size/fxn. Mild BAE. Mild MR/AI.   Arthritis    Asthma    Atopic dermatitis    CAD (coronary artery disease)    a. 2016 s/p CABG x 2 (LIMA->LAD, VG->OM); b. 08/2018 MV: EF 44%, no ischemia/infact.   Cardiac arrest (HCC) 10/2014   Cardiomyopathy, ischemic    a. 07/2018 Echo: EF 30-35%; 11/2019 Echo: EF 55-60%.   Carotid arterial disease (HCC)    a. 11/2019 Carotid U/S   CHF (congestive heart failure) (HCC)  CKD (chronic kidney disease), stage III (HCC)    COPD (chronic obstructive pulmonary disease) (HCC)    Diabetes mellitus without complication (HCC)    Dysrhythmia    Esophageal dilatation 2013   GERD (gastroesophageal reflux disease)    Glaucoma    Gout    Headache    Hearing loss    History of blood transfusion    Hypertension    Hypokalemia    Idiopathic angioedema    LGI bleed 08/06/2017   a. felt to be hemorrhoidal during  that admission (no drop in Hgb).   Lower back pain    Paroxysmal atrial fibrillation (HCC)    a. Dx 2016-->h/o difficult to control rates (complicated by noncompliance), not felt to be a candidate for ablation or antiarrhythmic due to noncompliance; b. Recurrent AF 2021 - converted w/ IV dilt; c. CHA2DS2VASc = 7-->Xarelto.   Personal history of noncompliance with medical treatment, presenting hazards to health    Pneumonia    Prediabetes    Presence of permanent cardiac pacemaker    Right leg numbness    S/P CABG x 2 with clipping of LA appendage 11/14/2014   LIMA to LAD, SVG to OM, EVH via right thigh   Urine incontinence    Uterine fibroid     Past Surgical History:  Procedure Laterality Date   AV NODE ABLATION N/A 01/18/2021   Procedure: AV NODE ABLATION;  Surgeon: [name], MD;  Location: [facility] INVASIVE CV LAB;  Service: Cardiovascular;  Laterality: N/A;   BALLOON DILATION N/A 10/10/2019   Procedure: BALLOON DILATION;  Surgeon: Kathi Der, MD;  Location: [facility] ENDOSCOPY;  Service: Gastroenterology;  Laterality: N/A;   BIOPSY  10/10/2019   Procedure: BIOPSY;  Surgeon: Kathi Der, MD;  Location: [facility] ENDOS[facility];  Service: Gastroenterology;;   BIOPSY  10/11/2019   Procedure: BIOPSY;  Surgeon: Kathi Der, MD;  Location: [facility];  Service: Gastroenterology;;   Earnstine Regal N/A 11/21/2022   Procedure: BIVI  ICD UPGRADE;  Surgeon: [name], MD;  Location: [facility] INVASIVE CV LAB;  Service: Cardiovascular;  Laterality: N/A;   CARDIOVERSION N/A 11/18/2014   Procedure: CARDIOVERSION;  Surgeon: Chrystie Nose, MD;  Location: [facility] OR;  Service: Cardiovascular;  Laterality: N/A;   CARDIOVERSION N/A 12/25/2017   Procedure: CARDIOVERSION;  Surgeon: Dolores Patty, MD;  Location: [facility] ENDOSCOPY;  Service: Cardiovascular;  Laterality: N/A;   CLIPPING OF ATRIAL APPENDAGE N/A 11/14/2014   Procedure: CLIPPING OF ATRIAL APPENDAGE;  Surgeon: Purcell Nails, MD;  Location: [facility]  OR;  Service: Open Heart Surgery;  Laterality: N/A;   COLONOSCOPY  2013   COLONOSCOPY WITH PROPOFOL N/A 10/11/2019   Procedure: COLONOSCOPY WITH PROPOFOL;  Surgeon: Kathi Der, MD;  [facility];  Service: Gastroenterology;  Laterality: N/A;   CORONARY ARTERY BYPASS GRAFT N/A 11/14/2014   Procedure: CORONARY ARTERY BYPASS GRAFTING (CABG)TIMES 2 USING LEFT INTERNAL MAMMARY ARTERY AND RIGHT SAPHENOUS VEIN HARVESTED ENDOSCOPICALLY;  Surgeon: Purcell Nails, MD;  Location: [facility] OR;  Service: Open Heart Surgery;  Laterality: N/A;   ESOPHAGOGASTRODUODENOSCOPY (EGD) WITH PROPOFOL N/A 10/10/2019   Procedure: ESOPHAGOGASTRODUODENOSCOPY (EGD) WITH PROPOFOL;  Surgeon: Kathi Der, MD;  [facility];  Service: Gastroenterology;  Laterality: N/A;   ESOPHAGOGASTRODUODENOSCOPY (EGD) WITH PROPOFOL N/A 10/27/2019   Procedure: ESOPHAGOGASTRODUODENOSCOPY (EGD) WITH PROPOFOL;  Surgeon: Kerin Salen, MD;  Location: [facility] ENDOSCOPY;  Service: Gastroenterology;  Laterality: N/A;   GIVENS CAPSULE STUDY N/A 10/27/2019   Procedure: GIVENS CAPSULE STUDY;  Surgeon: Kerin Salen, MD;  [facility];  Service: Gastroenterology;  Laterality: N/A;   INSERT / REPLACE / REMOVE PACEMAKER     LEFT HEART CATHETERIZATION WITH CORONARY ANGIOGRAM N/A 11/04/2014   Procedure: LEFT HEART CATHETERIZATION WITH CORONARY ANGIOGRAM;  Surgeon: Lennette Bihari, MD;  Location: [facility] CATH LAB;  Service: Cardiovascular;  Laterality: N/A;   PACEMAKER IMPLANT N/A 01/18/2021   Procedure: PACEMAKER IMPLANT;  Surgeon: [name], MD;  [facility];  Service: Cardiovascular;  Laterality: N/A;   PACEMAKER IMPLANT Left    POLYPECTOMY  10/11/2019   Procedure: POLYPECTOMY;  Surgeon: Kathi Der, MD;  Location[facility];  Service: Gastroenterology;;   RIGHT HEART CATH N/A 06/08/2022   Procedure: RIGHT HEART CATH;  Surgeon: Dolores Patty, MD;  Location: [facility] INVASIVE CV LAB;  Service:  Cardiovascular;  Laterality: N/A;   TEE WITHOUT CARDIOVERSION N/A 11/14/2014   Procedure: TRANSESOPHAGEAL ECHOCARDIOGRAM (TEE);  Surgeon: Purcell Nails, MD;  Location: [facility] OR;  Service: Open Heart Surgery;  Laterality: N/A;   TEE WITHOUT CARDIOVERSION N/A 12/25/2017   Procedure: TRANSESOPHAGEAL ECHOCARDIOGRAM (TEE);  Surgeon: Dolores Patty, MD;  Location: [facility] ENDOSCOPY;  Service: Cardiovascular;  Laterality: N/A;   TEMPORARY PACEMAKER INSERTION  11/04/2014   Procedure: TEMPORARY PACEMAKER INSERTION;  Surgeon: Lennette Bihari, MD;  Location: [facility] CATH LAB;  Service: Cardiovascular;;   TOOTH EXTRACTION      MEDICATIONS: No current facility-administered medications for this encounter.    acetaminophen (TYLENOL) 500 MG tablet   allopurinol (ZYLOPRIM) 100 MG tablet   atorvastatin (LIPITOR) 10 MG tablet   gabapentin (NEURONTIN) 100 MG capsule   hydrALAZINE (APRESOLINE) 25 MG tablet   HYDROcodone-acetaminophen (NORCO/VICODIN) 5-325 MG tablet   losartan (COZAAR) 50 MG tablet   metFORMIN (GLUCOPHAGE) 500 MG tablet   Naphazoline HCl (CLEAR EYES OP)   pantoprazole (PROTONIX) 40 MG tablet   Rivaroxaban (XARELTO) 15 MG TABS tablet   spironolactone (ALDACTONE) 25 MG tablet   torsemide (DEMADEX) 20 MG tablet   TRELEGY ELLIPTA 100-62.5-25 MCG/ACT AEPB   Alcohol Swabs (ALCOHOL PADS) 70 % PADS   Blood Glucose Monitoring Suppl (ONE TOUCH ULTRA 2) w/Device KIT   losartan (COZAAR) 100 MG tablet   metoprolol succinate (TOPROL-XL) 100 MG 24 hr tablet   ONETOUCH ULTRA test strip   oxyCODONE-acetaminophen (PERCOCET/ROXICET) 5-325 MG tablet   Jodell Cipro Ward, PA-C [facility] Pre-Surgical Testing [phone]

## 2023-06-23 ENCOUNTER — Other Ambulatory Visit: Payer: Self-pay | Admitting: Internal Medicine

## 2023-06-24 ENCOUNTER — Encounter (HOSPITAL_COMMUNITY): Payer: Self-pay

## 2023-06-24 NOTE — Discharge Instructions (Signed)
Netta Cedars, MD [facility]  Please read the following information regarding your care after surgery.  Medications  You only need a prescription for the narcotic pain medicine (ex. oxycodone, Percocet, Norco).  All of the other medicines listed below are available over the counter. ? Aleve 2 pills twice a day for the first 3 days after surgery. ? acetominophen (Tylenol) 650 mg every 4-6 hours as you need for minor to moderate pain ? oxycodone as prescribed for severe pain  ? To help prevent blood clots, resume your Xarelto per your internist/cardiologist recommendations after surgery  Weight Bearing ? Do NOT bear any weight on the operated leg or foot. This means do NOT touch your surgical leg to the ground!  Cast / Splint / Dressing ? If you have a splint, do NOT remove this. Keep your splint, cast or dressing clean and dry.  Don't put anything (coat hanger, pencil, etc) down inside of it.  If it gets wet, call the office immediately to schedule an appointment for a cast change.  Swelling IMPORTANT: It is normal for you to have swelling where you had surgery. To reduce swelling and pain, keep at least 3 pillows under your leg so that your toes are above your nose and your heel is above the level of your hip.  It may be necessary to keep your foot or leg elevated for several weeks.  This is critical to helping your incisions heal and your pain to feel better.  Follow Up Call my office at [phone] when you are discharged from the hospital or surgery center to schedule an appointment to be seen 7-10 days after surgery.  Call my office at [phone] if you develop a fever >101.5 F, nausea, vomiting, bleeding from the surgical site or severe pain.

## 2023-06-24 NOTE — H&P (Signed)
ORTHOPAEDIC SURGERY H&P  Subjective:  The patient presents with right ankle fx.   Past Medical History:  Diagnosis Date   (HFimpEF) heart failure with improved ejection fraction (HCC)    a. 07/2018 Echo: EF 30-35%; b. 11/2019 Echo: EF 55-60%, no rwma, Gr2 DD, Nl RV size/fxn. Mild BAE. Mild MR/AI.   Arthritis    Asthma    Atopic dermatitis    CAD (coronary artery disease)    a. 2016 s/p CABG x 2 (LIMA->LAD, VG->OM); b. 08/2018 MV: EF 44%, no ischemia/infact.   Cardiac arrest (HCC) 10/2014   Cardiomyopathy, ischemic    a. 07/2018 Echo: EF 30-35%; 11/2019 Echo: EF 55-60%.   Carotid arterial disease (HCC)    a. 11/2019 Carotid U/S   CHF (congestive heart failure) (HCC)    CKD (chronic kidney disease), stage III (HCC)    COPD (chronic obstructive pulmonary disease) (HCC)    Diabetes mellitus without complication (HCC)    Dysrhythmia    Esophageal dilatation 2013   GERD (gastroesophageal reflux disease)    Glaucoma    Gout    Headache    Hearing loss    History of blood transfusion    Hypertension    Hypokalemia    Idiopathic angioedema    LGI bleed 08/06/2017   a. felt to be hemorrhoidal during that admission (no drop in Hgb).   Lower back pain    Paroxysmal atrial fibrillation (HCC)    a. Dx 2016-->h/o difficult to control rates (complicated by noncompliance), not felt to be a candidate for ablation or antiarrhythmic due to noncompliance; b. Recurrent AF 2021 - converted w/ IV dilt; c. CHA2DS2VASc = 7-->Xarelto.   Personal history of noncompliance with medical treatment, presenting hazards to health    Pneumonia    Prediabetes    Presence of permanent cardiac pacemaker    Right leg numbness    S/P CABG x 2 with clipping of LA appendage 11/14/2014   LIMA to LAD, SVG to OM, EVH via right thigh   Urine incontinence    Uterine fibroid     Past Surgical History:  Procedure Laterality Date   AV NODE ABLATION N/A 01/18/2021   Procedure: AV NODE ABLATION;  Surgeon: [name], MD;  Location: [facility] INVASIVE CV LAB;  Service: Cardiovascular;  Laterality: N/A;   BALLOON DILATION N/A 10/10/2019   Procedure: BALLOON DILATION;  Surgeon: Kathi Der, MD;  Location: [facility] ENDOSCOPY;  Service: Gastroenterology;  Laterality: N/A;   BIOPSY  10/10/2019   Procedure: BIOPSY;  Surgeon: Kathi Der, MD;  Location: [facility] ENDOSCOPY;  Service: Gastroenterology;;   BIOPSY  10/11/2019   Procedure: BIOPSY;  Surgeon: Kathi Der, MD;  Location: [facility] ENDOS[facility];  Service: Gastroenterology;;   Earnstine Regal N/A 11/21/2022   Procedure: BIVI  ICD UPGRADE;  Surgeon: [name], MD;  Location: [facility] INVASIVE CV LAB;  Service: Cardiovascular;  Laterality: N/A;   CARDIOVERSION N/A 11/18/2014   Procedure: CARDIOVERSION;  Surgeon: Chrystie Nose, MD;  Location: [facility] OR;  Service: Cardiovascular;  Laterality: N/A;   CARDIOVERSION N/A 12/25/2017   Procedure: CARDIOVERSION;  Surgeon: Dolores Patty, MD;  Location: [facility] ENDOSCOPY;  Service: Cardiovascular;  Laterality: N/A;   CLIPPING OF ATRIAL APPENDAGE N/A 11/14/2014   Procedure: CLIPPING OF ATRIAL APPENDAGE;  Surgeon: Purcell Nails, MD;  Location: [facility] OR;  Service: Open Heart Surgery;  Laterality: N/A;   COLONOSCOPY  2013   COLONOSCOPY WITH PROPOFOL N/A 10/11/2019   Procedure: COLONOSCOPY WITH PROPOFOL;  Surgeon: Levora Angel,  Darcus Austin, MD;  Location: [facility] ENDOSCOPY;  Service: Gastroenterology;  Laterality: N/A;   CORONARY ARTERY BYPASS GRAFT N/A 11/14/2014   Procedure: CORONARY ARTERY BYPASS GRAFTING (CABG)TIMES 2 USING LEFT INTERNAL MAMMARY ARTERY AND RIGHT SAPHENOUS VEIN HARVESTED ENDOSCOPICALLY;  Surgeon: Purcell Nails, MD;  Location: [facility] OR;  Service: Open Heart Surgery;  Laterality: N/A;   ESOPHAGOGASTRODUODENOSCOPY (EGD) WITH PROPOFOL N/A 10/10/2019   Procedure: ESOPHAGOGASTRODUODENOSCOPY (EGD) WITH PROPOFOL;  Surgeon: Kathi Der, MD;  Location: [facility] ENDOS[facility];  Service: Gastroenterology;  Laterality: N/A;    ESOPHAGOGASTRODUODENOSCOPY (EGD) WITH PROPOFOL N/A 10/27/2019   Procedure: ESOPHAGOGASTRODUODENOSCOPY (EGD) WITH PROPOFOL;  Surgeon: Kerin Salen, MD;  Location: [facility] ENDOSCOPY;  Service: Gastroenterology;  Laterality: N/A;   GIVENS CAPSULE STUDY N/A 10/27/2019   Procedure: GIVENS CAPSULE STUDY;  Surgeon: Kerin Salen, MD;  Location: [facility] ENDOSCOPY;  Service: Gastroenterology;  Laterality: N/A;   INSERT / REPLACE / REMOVE PACEMAKER     LEFT HEART CATHETERIZATION WITH CORONARY ANGIOGRAM N/A 11/04/2014   Procedure: LEFT HEART CATHETERIZATION WITH CORONARY ANGIOGRAM;  Surgeon: Lennette Bihari, MD;  Location: [facility] CATH LAB;  Service: Cardiovascular;  Laterality: N/A;   PACEMAKER IMPLANT N/A 01/18/2021   Procedure: PACEMAKER IMPLANT;  Surgeon: [name], MD;  Location: [facility] INVAS[facility] CV LAB;  Service: Cardiovascular;  Laterality: N/A;   PACEMAKER IMPLANT Left    POLYPECTOMY  10/11/2019   Procedure: POLYPECTOMY;  Surgeon: Kathi Der, MD;  Location: [facility] ENDOSCOPY;  Service: Gastroenterology;;   RIGHT HEART CATH N/A 06/08/2022   Procedure: RIGHT HEART CATH;  Surgeon: Dolores Patty, MD;  Location: [facility] INVASIVE CV LAB;  Service: Cardiovascular;  Laterality: N/A;   TEE WITHOUT CARDIOVERSION N/A 11/14/2014   Procedure: TRANSESOPHAGEAL ECHOCARDIOGRAM (TEE);  Surgeon: Purcell Nails, MD;  Location: [facility] OR;  Service: Open Heart Surgery;  Laterality: N/A;   TEE WITHOUT CARDIOVERSION N/A 12/25/2017   Procedure: TRANSESOPHAGEAL ECHOCARDIOGRAM (TEE);  Surgeon: Dolores Patty, MD;  Location: [facility] ENDOSCOPY;  Service: Cardiovascular;  Laterality: N/A;   TEMPORARY PACEMAKER INSERTION  11/04/2014   Procedure: TEMPORARY PACEMAKER INSERTION;  Surgeon: Lennette Bihari, MD;  Location: [facility] CATH LAB;  Service: Cardiovascular;;   TOOTH EXTRACTION       (Not in an outpatient encounter)    Allergies  Allergen Reactions   Bee Venom Anaphylaxis   Ivp Dye [Iodinated Contrast Media] Anaphylaxis   Ace Inhibitors  Other (See Comments)    angioedema   Atenolol Other (See Comments)    severe headaches   Codeine Nausea And Vomiting   Entresto [Sacubitril-Valsartan] Other (See Comments)    Chest pain    Isosorbide Other (See Comments)    Severe headaches   Shrimp [Shellfish Allergy] Swelling   Simvastatin Other (See Comments)    not sure   Jardiance [Empagliflozin] Other (See Comments)    Caused Boils    Penicillin G Rash    Social History   Socioeconomic History   Marital status: Widowed    Spouse name: Not on file   Number of children: 1   Years of education: 33   Highest education level: Not on file  Occupational History   Occupation: RETIRED [employer]  Tobacco Use   Smoking status: Some Days    Current packs/day: 0.00    Average packs/day: 0.5 packs/day for 56.0 years (28.0 ttl pk-yrs)    Types: Cigarettes    Start date: 11/13/1965    Last attempt to quit: 11/13/2021    Years since quitting: 1.6   Smokeless tobacco:  Never   Tobacco comments:    smoking 2 cigarettes/day as of 10/23/20  Vaping Use   Vaping status: Never Used  Substance and Sexual Activity   Alcohol use: No    Alcohol/week: 0.0 standard drinks of alcohol   Drug use: No   Sexual activity: Not on file  Other Topics Concern   Not on file  Social History Narrative   Patient reports it being difficult to pay for everything due to outstanding medical bills from hospital stay last year but states she is able to keep up with basic expenses.  Patient does have some concerns with her house's condition due to a water leak- had her roof repaired last month but has some leaking in the front which has affected her porch.  Patient owns her own car and is able to drive herself but has some concerns about the reliability of her car- has gotten taxi to the clinic for appointment in the past when her car broke down.   Social Determinants of Health   Financial Resource Strain: Low Risk  (06/07/2018)   Overall Financial  Resource Strain (CARDIA)    Difficulty of Paying Living Expenses: Not hard at all  Recent Concern: Financial Resource Strain - Medium Risk (05/21/2018)   Overall Financial Resource Strain (CARDIA)    Difficulty of Paying Living Expenses: Somewhat hard  Food Insecurity: No Food Insecurity (12/15/2022)   Hunger Vital Sign    Worried About Running Out of Food in the Last Year: Never true    Ran Out of Food in the Last Year: Never true  Transportation Needs: No Transportation Needs (12/15/2022)   PRAPARE - Administrator, Civil Service (Medical): No    Lack of Transportation (Non-Medical): No  Physical Activity: Not on file  Stress: Stress Concern Present (02/22/2023)   [employer] of Occupational Health - Occupational Stress Questionnaire    Feeling of Stress : To some extent  Social Connections: Not on file  Intimate Partner Violence: Not At Risk (06/09/2022)   Humiliation, Afraid, Rape, and Kick questionnaire    Fear of Current or Ex-Partner: No    Emotionally Abused: No    Physically Abused: No    Sexually Abused: No     History reviewed. No pertinent family history.   Review of Systems Pertinent items are noted in HPI.  Objective: Vital signs in last 24 hours:    06/22/2023    9:30 AM 06/18/2023   11:22 AM 06/18/2023   10:13 AM  Vitals with BMI  Height 5' 7.5"    Weight 173 lbs    BMI 26.68    Systolic  180 190  Diastolic  99 99  Pulse  66 76      EXAM: General: Well nourished, well developed. Awake, alert and oriented to time, place, person. Normal mood and affect. No apparent distress. Breathing room air.  Operative Lower Extremity: Alignment - Neutral Deformity - None Skin intact Tenderness to palpation - right ankle 5/5 TA, PT, GS, Per, EHL, FHL Sensation intact to light touch throughout Palpable DP and PT pulses Special testing: None  The contralateral foot/ankle was examined for comparison and noted to be neurovascularly intact with no  localized deformity, swelling, or tenderness.  Imaging Review All images taken were independently reviewed by me.  Assessment/Plan: The clinical and radiographic findings were reviewed and discussed at length with the patient.  The patient has right ankle fx.  We spoke at length about the natural course of  these findings. We discussed nonoperative and operative treatment options in detail.  The risks and benefits were presented and reviewed. The risks due to hardware failure/irritation, new/persistent/recurrent infection, stiffness, nerve/vessel/tendon injury, nonunion/malunion of any fracture, wound healing issues, allograft usage, development of arthritis, failure of this surgery, possibility of external fixation in certain situations, possibility of delayed definitive surgery, need for further surgery, prolonged wound care including further soft tissue coverage procedures, thromboembolic events, anesthesia/medical complications/events perioperatively and beyond, amputation, death among others were discussed. The patient acknowledged the explanation and agreed to proceed with the plan.  [name] [name]  Orthopaedic Surgery [facility]

## 2023-06-25 ENCOUNTER — Inpatient Hospital Stay (HOSPITAL_COMMUNITY)
Admit: 2023-06-25 | Discharge: 2023-06-28 | DRG: 493 | Disposition: A | Discharge status: Home Health | Payer: Medicare Other | Attending: Internal Medicine | Admitting: Internal Medicine

## 2023-06-25 ENCOUNTER — Other Ambulatory Visit: Payer: Self-pay

## 2023-06-25 DIAGNOSIS — S82851A Displaced trimalleolar fracture of right lower leg, initial encounter for closed fracture: Principal | ICD-10-CM | POA: Diagnosis present

## 2023-06-25 DIAGNOSIS — E785 Hyperlipidemia, unspecified: Secondary | ICD-10-CM | POA: Diagnosis present

## 2023-06-25 DIAGNOSIS — E1142 Type 2 diabetes mellitus with diabetic polyneuropathy: Secondary | ICD-10-CM | POA: Diagnosis present

## 2023-06-25 DIAGNOSIS — D72829 Elevated white blood cell count, unspecified: Secondary | ICD-10-CM | POA: Diagnosis not present

## 2023-06-25 DIAGNOSIS — Z950 Presence of cardiac pacemaker: Secondary | ICD-10-CM

## 2023-06-25 DIAGNOSIS — Y92009 Unspecified place in unspecified non-institutional (private) residence as the place of occurrence of the external cause: Secondary | ICD-10-CM | POA: Diagnosis not present

## 2023-06-25 DIAGNOSIS — Z91199 Patient's noncompliance with other medical treatment and regimen due to unspecified reason: Secondary | ICD-10-CM | POA: Diagnosis not present

## 2023-06-25 DIAGNOSIS — E876 Hypokalemia: Secondary | ICD-10-CM | POA: Diagnosis present

## 2023-06-25 DIAGNOSIS — F419 Anxiety disorder, unspecified: Secondary | ICD-10-CM | POA: Diagnosis present

## 2023-06-25 DIAGNOSIS — Z951 Presence of aortocoronary bypass graft: Secondary | ICD-10-CM

## 2023-06-25 DIAGNOSIS — Z8249 Family history of ischemic heart disease and other diseases of the circulatory system: Secondary | ICD-10-CM | POA: Diagnosis not present

## 2023-06-25 DIAGNOSIS — E119 Type 2 diabetes mellitus without complications: Secondary | ICD-10-CM

## 2023-06-25 DIAGNOSIS — F17210 Nicotine dependence, cigarettes, uncomplicated: Secondary | ICD-10-CM | POA: Diagnosis not present

## 2023-06-25 DIAGNOSIS — Z91013 Allergy to seafood: Secondary | ICD-10-CM

## 2023-06-25 DIAGNOSIS — N183 Chronic kidney disease, stage 3 unspecified: Secondary | ICD-10-CM | POA: Diagnosis not present

## 2023-06-25 DIAGNOSIS — E1169 Type 2 diabetes mellitus with other specified complication: Secondary | ICD-10-CM

## 2023-06-25 DIAGNOSIS — I5042 Chronic combined systolic (congestive) and diastolic (congestive) heart failure: Secondary | ICD-10-CM | POA: Diagnosis present

## 2023-06-25 DIAGNOSIS — I130 Hypertensive heart and chronic kidney disease with heart failure and stage 1 through stage 4 chronic kidney disease, or unspecified chronic kidney disease: Secondary | ICD-10-CM | POA: Diagnosis present

## 2023-06-25 DIAGNOSIS — I255 Ischemic cardiomyopathy: Secondary | ICD-10-CM | POA: Diagnosis not present

## 2023-06-25 DIAGNOSIS — G8918 Other acute postprocedural pain: Secondary | ICD-10-CM | POA: Diagnosis not present

## 2023-06-25 DIAGNOSIS — Z91041 Radiographic dye allergy status: Secondary | ICD-10-CM

## 2023-06-25 DIAGNOSIS — S82891A Other fracture of right lower leg, initial encounter for closed fracture: Secondary | ICD-10-CM

## 2023-06-25 DIAGNOSIS — Z8719 Personal history of other diseases of the digestive system: Secondary | ICD-10-CM

## 2023-06-25 DIAGNOSIS — W19XXXA Unspecified fall, initial encounter: Secondary | ICD-10-CM | POA: Diagnosis present

## 2023-06-25 DIAGNOSIS — S82201A Unspecified fracture of shaft of right tibia, initial encounter for closed fracture: Secondary | ICD-10-CM | POA: Diagnosis not present

## 2023-06-25 DIAGNOSIS — I4821 Permanent atrial fibrillation: Secondary | ICD-10-CM | POA: Diagnosis not present

## 2023-06-25 DIAGNOSIS — J4489 Other specified chronic obstructive pulmonary disease: Secondary | ICD-10-CM | POA: Diagnosis present

## 2023-06-25 DIAGNOSIS — S93431A Sprain of tibiofibular ligament of right ankle, initial encounter: Secondary | ICD-10-CM | POA: Diagnosis not present

## 2023-06-25 DIAGNOSIS — H9190 Unspecified hearing loss, unspecified ear: Secondary | ICD-10-CM | POA: Diagnosis not present

## 2023-06-25 DIAGNOSIS — Z79899 Other long term (current) drug therapy: Secondary | ICD-10-CM

## 2023-06-25 DIAGNOSIS — Z888 Allergy status to other drugs, medicaments and biological substances status: Secondary | ICD-10-CM

## 2023-06-25 DIAGNOSIS — Z8701 Personal history of pneumonia (recurrent): Secondary | ICD-10-CM

## 2023-06-25 DIAGNOSIS — E1122 Type 2 diabetes mellitus with diabetic chronic kidney disease: Secondary | ICD-10-CM | POA: Diagnosis present

## 2023-06-25 DIAGNOSIS — I2510 Atherosclerotic heart disease of native coronary artery without angina pectoris: Secondary | ICD-10-CM | POA: Diagnosis not present

## 2023-06-25 DIAGNOSIS — K219 Gastro-esophageal reflux disease without esophagitis: Secondary | ICD-10-CM | POA: Diagnosis present

## 2023-06-25 DIAGNOSIS — Z7901 Long term (current) use of anticoagulants: Secondary | ICD-10-CM

## 2023-06-25 DIAGNOSIS — Z7951 Long term (current) use of inhaled steroids: Secondary | ICD-10-CM

## 2023-06-25 DIAGNOSIS — J449 Chronic obstructive pulmonary disease, unspecified: Principal | ICD-10-CM

## 2023-06-25 DIAGNOSIS — Z7984 Long term (current) use of oral hypoglycemic drugs: Secondary | ICD-10-CM

## 2023-06-25 DIAGNOSIS — Z8674 Personal history of sudden cardiac arrest: Secondary | ICD-10-CM | POA: Diagnosis not present

## 2023-06-25 DIAGNOSIS — S82899A Other fracture of unspecified lower leg, initial encounter for closed fracture: Principal | ICD-10-CM | POA: Diagnosis present

## 2023-06-25 DIAGNOSIS — Z91030 Bee allergy status: Secondary | ICD-10-CM

## 2023-06-25 DIAGNOSIS — S82841A Displaced bimalleolar fracture of right lower leg, initial encounter for closed fracture: Secondary | ICD-10-CM | POA: Diagnosis not present

## 2023-06-25 DIAGNOSIS — Z833 Family history of diabetes mellitus: Secondary | ICD-10-CM

## 2023-06-25 DIAGNOSIS — H409 Unspecified glaucoma: Secondary | ICD-10-CM | POA: Diagnosis present

## 2023-06-25 DIAGNOSIS — Z885 Allergy status to narcotic agent status: Secondary | ICD-10-CM

## 2023-06-25 DIAGNOSIS — S8251XA Displaced fracture of medial malleolus of right tibia, initial encounter for closed fracture: Secondary | ICD-10-CM | POA: Diagnosis not present

## 2023-06-25 DIAGNOSIS — M1990 Unspecified osteoarthritis, unspecified site: Secondary | ICD-10-CM | POA: Diagnosis present

## 2023-06-25 DIAGNOSIS — Z880 Allergy status to penicillin: Secondary | ICD-10-CM

## 2023-06-25 LAB — GLUCOSE, CAPILLARY
Glucose-Capillary: 121 mg/dL — ABNORMAL HIGH (ref 70–99)
Glucose-Capillary: 91 mg/dL (ref 70–99)

## 2023-06-25 MED ORDER — HYDROCODONE-ACETAMINOPHEN 5-325 MG PO TABS
1.0000 | ORAL_TABLET | ORAL | Status: DC | PRN
  Administered 2023-06-25 – 2023-06-28 (×7): 1 via ORAL
  Filled 2023-06-25 (×7): qty 1

## 2023-06-25 MED ORDER — SPIRONOLACTONE 25 MG PO TABS
25.0000 mg | ORAL_TABLET | Freq: Every day | ORAL | Status: DC
  Administered 2023-06-25 – 2023-06-28 (×4): 25 mg via ORAL
  Filled 2023-06-25 (×4): qty 1

## 2023-06-25 MED ORDER — ACETAMINOPHEN 325 MG PO TABS
650.0000 mg | ORAL_TABLET | Freq: Four times a day (QID) | ORAL | Status: DC | PRN
  Administered 2023-06-25 – 2023-06-27 (×6): 650 mg via ORAL
  Filled 2023-06-25 (×7): qty 2

## 2023-06-25 MED ORDER — ATORVASTATIN CALCIUM 10 MG PO TABS
10.0000 mg | ORAL_TABLET | Freq: Every day | ORAL | Status: DC
  Administered 2023-06-25 – 2023-06-28 (×4): 10 mg via ORAL
  Filled 2023-06-25 (×4): qty 1

## 2023-06-25 MED ORDER — ALBUTEROL SULFATE (2.5 MG/3ML) 0.083% IN NEBU: 2.5000 mg | INHALATION_SOLUTION | RESPIRATORY_TRACT | Status: DC | PRN

## 2023-06-25 MED ORDER — INSULIN ASPART 100 UNIT/ML IJ SOLN
0.0000 [IU] | Freq: Every day | INTRAMUSCULAR | Status: DC
  Administered 2023-06-26: 2 [IU] via SUBCUTANEOUS

## 2023-06-25 MED ORDER — GABAPENTIN 100 MG PO CAPS
100.0000 mg | ORAL_CAPSULE | Freq: Every day | ORAL | Status: DC
  Administered 2023-06-25 – 2023-06-27 (×3): 100 mg via ORAL
  Filled 2023-06-25 (×3): qty 1

## 2023-06-25 MED ORDER — ALLOPURINOL 100 MG PO TABS
100.0000 mg | ORAL_TABLET | Freq: Every day | ORAL | Status: DC
  Administered 2023-06-25 – 2023-06-28 (×4): 100 mg via ORAL
  Filled 2023-06-25 (×4): qty 1

## 2023-06-25 MED ORDER — OXYCODONE HCL 5 MG PO TABS
5.0000 mg | ORAL_TABLET | ORAL | Status: DC | PRN
  Filled 2023-06-25: qty 1

## 2023-06-25 MED ORDER — ONDANSETRON HCL 4 MG PO TABS: 4.0000 mg | ORAL_TABLET | Freq: Four times a day (QID) | ORAL | Status: DC | PRN

## 2023-06-25 MED ORDER — LOSARTAN POTASSIUM 50 MG PO TABS
50.0000 mg | ORAL_TABLET | Freq: Every day | ORAL | Status: DC
  Administered 2023-06-26 – 2023-06-28 (×3): 50 mg via ORAL
  Filled 2023-06-25 (×3): qty 1

## 2023-06-25 MED ORDER — ACETAMINOPHEN 650 MG RE SUPP: 650.0000 mg | Freq: Four times a day (QID) | RECTAL | Status: DC | PRN

## 2023-06-25 MED ORDER — INSULIN ASPART 100 UNIT/ML IJ SOLN
0.0000 [IU] | Freq: Three times a day (TID) | INTRAMUSCULAR | Status: DC
  Administered 2023-06-26: 5 [IU] via SUBCUTANEOUS
  Administered 2023-06-27 (×2): 2 [IU] via SUBCUTANEOUS

## 2023-06-25 MED ORDER — ONDANSETRON HCL 4 MG/2ML IJ SOLN: 4.0000 mg | Freq: Four times a day (QID) | INTRAMUSCULAR | Status: DC | PRN

## 2023-06-25 MED ORDER — ENOXAPARIN SODIUM 40 MG/0.4ML IJ SOSY
40.0000 mg | PREFILLED_SYRINGE | INTRAMUSCULAR | Status: DC
  Administered 2023-06-25 – 2023-06-27 (×3): 40 mg via SUBCUTANEOUS
  Filled 2023-06-25 (×3): qty 0.4

## 2023-06-25 MED ORDER — TORSEMIDE 20 MG PO TABS
20.0000 mg | ORAL_TABLET | Freq: Every day | ORAL | Status: DC
  Administered 2023-06-26 – 2023-06-28 (×3): 20 mg via ORAL
  Filled 2023-06-25 (×3): qty 1

## 2023-06-25 MED ORDER — PANTOPRAZOLE SODIUM 40 MG PO TBEC
40.0000 mg | DELAYED_RELEASE_TABLET | Freq: Every day | ORAL | Status: DC
  Administered 2023-06-26 – 2023-06-28 (×3): 40 mg via ORAL
  Filled 2023-06-25 (×3): qty 1

## 2023-06-25 MED ORDER — HYDRALAZINE HCL 50 MG PO TABS
50.0000 mg | ORAL_TABLET | Freq: Three times a day (TID) | ORAL | Status: DC
  Administered 2023-06-25 – 2023-06-28 (×9): 50 mg via ORAL
  Filled 2023-06-25 (×9): qty 1

## 2023-06-25 NOTE — Plan of Care (Signed)
  Problem: Health Behavior/Discharge Planning: Goal: Ability to manage health-related needs will improve Outcome: Progressing   Problem: Clinical Measurements: Goal: Respiratory complications will improve Outcome: Progressing   Problem: Elimination: Goal: Will not experience complications related to urinary retention Outcome: Progressing   Problem: Fluid Volume: Goal: Ability to maintain a balanced intake and output will improve Outcome: Progressing

## 2023-06-25 NOTE — Progress Notes (Signed)
This pt has big blister on her top of the right foot. Measured length is 9 inches and  width is 12 inches. There are more blisters around ankle area with drainage. There is a cast on and can't remove to assess. Notified to Dr.Ikramullah and charge nurse.

## 2023-06-25 NOTE — Progress Notes (Signed)
This pt only took half of hydrocodone/acetaminophen. She said if she took whole pill that can increase her HR. I wasted other half with charge nurse and tossed into Steri cycle container.

## 2023-06-25 NOTE — Plan of Care (Signed)
  Problem: Education: Goal: Knowledge of General Education information will improve Description: Including pain rating scale, medication(s)/side effects and non-pharmacologic comfort measures Outcome: Progressing   Problem: Health Behavior/Discharge Planning: Goal: Ability to manage health-related needs will improve Outcome: Progressing   Problem: Clinical Measurements: Goal: Ability to maintain clinical measurements within normal limits will improve Outcome: Progressing Goal: Will remain free from infection Outcome: Progressing Goal: Diagnostic test results will improve Outcome: Progressing   Problem: Activity: Goal: Risk for activity intolerance will decrease Outcome: Progressing   Problem: Pain Management: Goal: General experience of comfort will improve Outcome: Progressing   Problem: Safety: Goal: Ability to remain free from injury will improve Outcome: Progressing   Problem: Skin Integrity: Goal: Risk for impaired skin integrity will decrease Outcome: Progressing   Problem: Coping: Goal: Ability to adjust to condition or change in health will improve Outcome: Progressing   Problem: Metabolic: Goal: Ability to maintain appropriate glucose levels will improve Outcome: Progressing   Problem: Nutritional: Goal: Progress toward achieving an optimal weight will improve Outcome: Progressing   Problem: Skin Integrity: Goal: Risk for impaired skin integrity will decrease Outcome: Progressing   Problem: Tissue Perfusion: Goal: Adequacy of tissue perfusion will improve Outcome: Progressing   Problem: Clinical Measurements: Goal: Respiratory complications will improve Outcome: Adequate for Discharge Goal: Cardiovascular complication will be avoided Outcome: Adequate for Discharge   Problem: Nutrition: Goal: Adequate nutrition will be maintained Outcome: Adequate for Discharge   Problem: Coping: Goal: Level of anxiety will decrease Outcome: Adequate for  Discharge   Problem: Elimination: Goal: Will not experience complications related to bowel motility Outcome: Adequate for Discharge Goal: Will not experience complications related to urinary retention Outcome: Adequate for Discharge   Problem: Nutritional: Goal: Maintenance of adequate nutrition will improve Outcome: Adequate for Discharge

## 2023-06-25 NOTE — Care Plan (Signed)
Orthopaedic Surgery Plan of Care Note   -history and imaging reviewed with primary team (IM) -pt has right trimalleolar ankle fx -PMH includes hypertension, permanent AF, CAD status post CABG x 2, chronic combined congestive heart failure with ischemic cardiomyopathy, noninsulin-dependent type 2 diabetes  -please keep NPO and hold VTE ppx from [facility] for OR on Mon 11/11 (Xarelto held for 3 days preop as per Cards)   Netta Cedars, MD Orthopaedic Surgery [facility]

## 2023-06-25 NOTE — H&P (Signed)
History and Physical  [name] [name] ZOX:[id number] DOB: [birth year] DOA: 06/25/2023  PCP: [name], MD   Chief Complaint: Right ankle fracture  HPI: [name] [name] is a 77 y.o. female with medical history significant for hypertension, permanent AF, CAD status post CABG x 2, chronic combined congestive heart failure with ischemic cardiomyopathy, noninsulin-dependent type 2 diabetes admitted to the hospital for elective right ankle ORIF with Dr. Odis Hollingshead.  She had a mechanical fall at home on 11/3, on some juice to spilled on the floor in her home.  Since that time, she has been seen in consultation by orthopedic surgery and they plan elective ORIF on 11/11.  She has had perioperative workup and clearance by anesthesiology and her cardiologist.  She has been holding her Xarelto since 11/7.  She has developed a nontender nondraining blister on the dorsum of the right foot, but otherwise and is in her usual state of health.  Denies any chest pain, new or worsening shortness of breath, orthopnea, or lower extremity edema.  Review of Systems: Please see HPI for pertinent positives and negatives. A complete 10 system review of systems are otherwise negative.  Past Medical History:  Diagnosis Date   (HFimpEF) heart failure with improved ejection fraction (HCC)    a. 07/2018 Echo: EF 30-35%; b. 11/2019 Echo: EF 55-60%, no rwma, Gr2 DD, Nl RV size/fxn. Mild BAE. Mild MR/AI.   Arthritis    Asthma    Atopic dermatitis    CAD (coronary artery disease)    a. 2016 s/p CABG x 2 (LIMA->LAD, VG->OM); b. 08/2018 MV: EF 44%, no ischemia/infact.   Cardiac arrest (HCC) 10/2014   Cardiomyopathy, ischemic    a. 07/2018 Echo: EF 30-35%; 11/2019 Echo: EF 55-60%.   Carotid arterial disease (HCC)    a. 11/2019 Carotid U/S   CHF (congestive heart failure) (HCC)    CKD (chronic kidney disease), stage III (HCC)    COPD (chronic obstructive pulmonary disease) (HCC)    Diabetes mellitus without complication (HCC)     Dysrhythmia    Esophageal dilatation 2013   GERD (gastroesophageal reflux disease)    Glaucoma    Gout    Headache    Hearing loss    History of blood transfusion    Hypertension    Hypokalemia    Idiopathic angioedema    LGI bleed 08/06/2017   a. felt to be hemorrhoidal during that admission (no drop in Hgb).   Lower back pain    Paroxysmal atrial fibrillation (HCC)    a. Dx 2016-->h/o difficult to control rates (complicated by noncompliance), not felt to be a candidate for ablation or antiarrhythmic due to noncompliance; b. Recurrent AF 2021 - converted w/ IV dilt; c. CHA2DS2VASc = 7-->Xarelto.   Personal history of noncompliance with medical treatment, presenting hazards to health    Pneumonia    Prediabetes    Presence of permanent cardiac pacemaker    Right leg numbness    S/P CABG x 2 with clipping of LA appendage 11/14/2014   LIMA to LAD, SVG to OM, EVH via right thigh   Urine incontinence    Uterine fibroid    Past Surgical History:  Procedure Laterality Date   AV NODE ABLATION N/A 01/18/2021   Procedure: AV NODE ABLATION;  Surgeon: [name], MD;  Location: [facility] INVASIVE CV LAB;  Service: Cardiovascular;  Laterality: N/A;   BALLOON DILATION N/A 10/10/2019   Procedure: BALLOON DILATION;  Surgeon: Kathi Der, MD;  Location: [facility]  ENDOSCOPY;  Service: Gastroenterology;  Laterality: N/A;   BIOPSY  10/10/2019   Procedure: BIOPSY;  Surgeon: Kathi Der, MD;  Location: [facility] ENDOSCOPY;  Service: Gastroenterology;;   BIOPSY  10/11/2019   Procedure: BIOPSY;  Surgeon: Kathi Der, MD;  Location: [facility] ENDOSCOPY;  Service: Gastroenterology;;   Earnstine Regal N/A 11/21/2022   Procedure: BIVI  ICD UPGRADE;  Surgeon: [name], MD;  Location: [facility] INVASIVE CV LAB;  Service: Cardiovascular;  Laterality: N/A;   CARDIOVERSION N/A 11/18/2014   Procedure: CARDIOVERSION;  Surgeon: Chrystie Nose, MD;  Location: [facility] OR;  Service: Cardiovascular;  Laterality: N/A;    CARDIOVERSION N/A 12/25/2017   Procedure: CARDIOVERSION;  Surgeon: Dolores Patty, MD;  Location: [facility] ENDOSCOPY;  Service: Cardiovascular;  Laterality: N/A;   CLIPPING OF ATRIAL APPENDAGE N/A 11/14/2014   Procedure: CLIPPING OF ATRIAL APPENDAGE;  Surgeon: Purcell Nails, MD;  Location: [facility] OR;  Service: Open Heart Surgery;  Laterality: N/A;   COLONOSCOPY  2013   COLONOSCOPY WITH PROPOFOL N/A 10/11/2019   Procedure: COLONOSCOPY WITH PROPOFOL;  Surgeon: Kathi Der, MD;  Location: [facility] ENDOSCOPY;  Service: Gastroenterology;  Laterality: N/A;   CORONARY ARTERY BYPASS GRAFT N/A 11/14/2014   Procedure: CORONARY ARTERY BYPASS GRAFTING (CABG)TIMES 2 USING LEFT INTERNAL MAMMARY ARTERY AND RIGHT SAPHENOUS VEIN HARVESTED ENDOSCOPICALLY;  Surgeon: Purcell Nails, MD;  Location: [facility] OR;  Service: Open Heart Surgery;  Laterality: N/A;   ESOPHAGOGASTRODUODENOSCOPY (EGD) WITH PROPOFOL N/A 10/10/2019   Procedure: ESOPHAGOGASTRODUODENOSCOPY (EGD) WITH PROPOFOL;  Surgeon: Kathi Der, MD;  Location[facility] ENDOSCOPY;  Service: Gastroenterology;  Laterality: N/A;   ESOPHAGOGASTRODUODENOSCOPY (EGD) WITH PROPOFOL N/A 10/27/2019   Procedure: ESOPHAGOGASTRODUODENOSCOPY (EGD) WITH PROPOFOL;  Surgeon: Kerin Salen, MD;  Location[facility] ENDOSCOPY;  Service: Gastroenterology;  Laterality: N/A;   GIVENS CAPSULE STUDY N/A 10/27/2019   Procedure: GIVENS CAPSULE STUDY;  Surgeon: Kerin Salen, MD;  Location: [facility] ENDOSCOPY;  Service: Gastroenterology;  Laterality: N/A;   INSERT / REPLACE / REMOVE PACEMAKER     LEFT HEART CATHETERIZATION WITH CORONARY ANGIOGRAM N/A 11/04/2014   Procedure: LEFT HEART CATHETERIZATION WITH CORONARY ANGIOGRAM;  Surgeon: Lennette Bihari, MD;  Location: [facility] CATH LAB;  Service: Cardiovascular;  Laterality: N/A;   PACEMAKER IMPLANT N/A 01/18/2021   Procedure: PACEMAKER IMPLANT;  Surgeon: [name], MD;  [facility] INVASIVE CV LAB;  Service: Cardiovascular;  Laterality: N/A;   PACEMAKER IMPLANT  Left    POLYPECTOMY  10/11/2019   Procedure: POLYPECTOMY;  Surgeon: Kathi Der, MD;  Location: [facility] ENDOSCOPY;  Service: Gastroenterology;;   RIGHT HEART CATH N/A 06/08/2022   Procedure: RIGHT HEART CATH;  Surgeon: Dolores Patty, MD;  Location: [facility] INVASIVE CV LAB;  Service: Cardiovascular;  Laterality: N/A;   TEE WITHOUT CARDIOVERSION N/A 11/14/2014   Procedure: TRANSESOPHAGEAL ECHOCARDIOGRAM (TEE);  Surgeon: Purcell Nails, MD;  Location: [facility] OR;  Service: Open Heart Surgery;  Laterality: N/A;   TEE WITHOUT CARDIOVERSION N/A 12/25/2017   Procedure: TRANSESOPHAGEAL ECHOCARDIOGRAM (TEE);  Surgeon: Dolores Patty, MD;  Location: [facility] ENDOSCOPY;  Service: Cardiovascular;  Laterality: N/A;   TEMPORARY PACEMAKER INSERTION  11/04/2014   Procedure: TEMPORARY PACEMAKER INSERTION;  Surgeon: Lennette Bihari, MD;  Location: [facility] CATH LAB;  Service: Cardiovascular;;   TOOTH EXTRACTION      Social History:  reports that she has been smoking cigarettes. She started smoking about 57 years ago. She has a 28 pack-year smoking history. She has never used smokeless tobacco. She reports that she does not drink alcohol and  does not use drugs.   Allergies  Allergen Reactions   Bee Venom Anaphylaxis   Ivp Dye [Iodinated Contrast Media] Anaphylaxis   Ace Inhibitors Other (See Comments)    angioedema   Atenolol Other (See Comments)    severe headaches   Codeine Nausea And Vomiting   Entresto [Sacubitril-Valsartan] Other (See Comments)    Chest pain    Isosorbide Other (See Comments)    Severe headaches   Shrimp [Shellfish Allergy] Swelling   Simvastatin Other (See Comments)    not sure   Jardiance [Empagliflozin] Other (See Comments)    Caused Boils    Penicillin G Rash    Family History  Problem Relation Age of Onset   Cancer Mother        LYMPHOMA   Heart disease Father    Other Father        TB   CVA Sister    Prostate cancer Brother 22   Diabetes Brother      Prior to  Admission medications   Medication Sig Start Date End Date Taking? Authorizing Provider  acetaminophen (TYLENOL) 500 MG tablet Take 500 mg by mouth as needed for mild pain (pain score 1-3) or moderate pain (pain score 4-6).   Yes [provider]  allopurinol (ZYLOPRIM) 100 MG tablet Take 1 tablet (100 mg total) by mouth daily. 06/16/23  Yes Bensimhon, [name], MD  Aromatic Inhalants (VICKS VAPOR INHALER IN) Inhale into the lungs as needed.   Yes [provider]  atorvastatin (LIPITOR) 10 MG tablet TAKE 1 TABLET BY MOUTH EVERYDAY AT BEDTIME 03/03/23  Yes [name], MD  gabapentin (NEURONTIN) 100 MG capsule TAKE 1 CAPSULE BY MOUTH EVERYDAY AT BEDTIME Patient taking differently: Take 100 mg by mouth daily. 05/16/23  Yes Vivi Barrack, DPM  hydrALAZINE (APRESOLINE) 25 MG tablet TAKE 2 TABLETS (50 MG TOTAL) BY MOUTH 3 (THREE) TIMES DAILY. 02/03/23  Yes Bensimhon, [name], MD  HYDROcodone-acetaminophen (NORCO/VICODIN) 5-325 MG tablet Take 1 tablet by mouth every 6 (six) hours as needed for moderate pain (pain score 4-6) or severe pain (pain score 7-10). 06/20/23  Yes [provider]  losartan (COZAAR) 50 MG tablet Take 50 mg by mouth daily.   Yes [provider]  metFORMIN (GLUCOPHAGE) 500 MG tablet TAKE 1 TABLET BY MOUTH EVERY DAY WITH BREAKFAST 06/23/23  Yes [name], MD  Naphazoline HCl (CLEAR EYES OP) Place 2 drops into both eyes daily as needed (irritation).   Yes [provider]  pantoprazole (PROTONIX) 40 MG tablet TAKE 1 TABLET BY MOUTH EVERY DAY 03/03/23  Yes [name], MD  Rivaroxaban (XARELTO) 15 MG TABS tablet Take 1 tablet (15 mg total) by mouth daily with supper. 12/23/22  Yes Milford, Anderson Malta, FNP  spironolactone (ALDACTONE) 25 MG tablet Take 1 tablet (25 mg total) by mouth daily. 12/16/22  Yes Bensimhon, [name], MD  torsemide (DEMADEX) 20 MG tablet Take 1 tablet (20 mg total) by mouth daily. 12/28/22  Yes Milford, [place],  FNP  TRELEGY ELLIPTA 100-62.5-25 MCG/ACT AEPB Inhale ONE PUFF into THE lungs daily Patient taking differently: Inhale 1 puff into the lungs as needed (wheezing/SOB). 06/27/22  Yes [name], MD  Alcohol Swabs (ALCOHOL PADS) 70 % PADS 1 Act by Does not apply route 2 (two) times daily. 04/28/23   [name], MD  Blood Glucose Monitoring Suppl (ONE TOUCH ULTRA 2) w/Device KIT 1 Act by Does not apply route 2 (two)  times daily. 04/28/23   [name], MD  losartan (COZAAR) 100 MG tablet Take 1 tablet (100 mg total) by mouth daily. Patient not taking: Reported on 06/22/2023 04/06/23   Bensimhon, [name], MD  metoprolol succinate (TOPROL-XL) 100 MG 24 hr tablet Take 100 mg by mouth daily. Patient not taking: Reported on 04/06/2023 10/06/22   [provider]  [facility] ULTRA test strip 1 each by Other route 2 (two) times daily. as directed 04/28/23   [name], MD  oxyCODONE-acetaminophen (PERCOCET/ROXICET) 5-325 MG tablet Take 1 tablet by mouth every 6 (six) hours as needed for severe pain (pain score 7-10). Patient not taking: Reported on 06/22/2023 06/18/23   Tegeler, Canary Brim, MD  losartan (COZAAR) 50 MG tablet Take 1 tablet (50 mg total) by mouth daily. 12/16/22   Bensimhon, [name], MD    Physical Exam: BP (!) 157/120 (BP Location: Right Arm)   Pulse 61   Temp 98.3 F (36.8 C) (Oral)   Resp (!) 24   General:  Alert, oriented, calm, in no acute distress  Eyes: EOMI, clear conjuctivae, white sclerea Neck: supple, no masses, trachea mildline  Cardiovascular: RRR, no murmurs or rubs, no peripheral edema  Respiratory: clear to auscultation bilaterally, no wheezes, no crackles  Abdomen: soft, nontender, nondistended, normal bowel tones heard  Skin: dry, no rashes  Musculoskeletal: no joint effusions, normal range of motion, right lower extremity is in a splint.  There is an uncomplicated looking right foot blister on the dorsum.  There is scant clear yellow appearing  drainage. Psychiatric: appropriate affect, normal speech  Neurologic: extraocular muscles intact, clear speech, moving all extremities with intact sensorium         Labs on Admission:  Basic Metabolic Panel: No results for input(s): "NA", "K", "CL", "CO2", "GLUCOSE", "BUN", "CREATININE", "CALCIUM", "MG", "PHOS" in the last 168 hours. Liver Function Tests: No results for input(s): "AST", "ALT", "ALKPHOS", "BILITOT", "PROT", "ALBUMIN" in the last 168 hours. No results for input(s): "LIPASE", "AMYLASE" in the last 168 hours. No results for input(s): "AMMONIA" in the last 168 hours. CBC: No results for input(s): "WBC", "NEUTROABS", "HGB", "HCT", "MCV", "PLT" in the last 168 hours. Cardiac Enzymes: No results for input(s): "CKTOTAL", "CKMB", "CKMBINDEX", "TROPONINI" in the last 168 hours.  BNP (last 3 results) Recent Labs    12/23/22 1412 04/06/23 1427 06/12/23 1549  BNP 135.6* 184.3* 295.2*    ProBNP (last 3 results) Recent Labs    12/12/22 1656  PROBNP 764.0*    CBG: No results for input(s): "GLUCAP" in the last 168 hours.  Radiological Exams on Admission: No results found.  Assessment/Plan [name] [name] is a 77 y.o. female with medical history significant for hypertension, permanent AF, CAD status post CABG x 2, chronic combined congestive heart failure with ischemic cardiomyopathy, noninsulin-dependent type 2 diabetes admitted to the hospital for elective right ankle ORIF with Dr. Odis Hollingshead.   Right ankle ORIF-per Dr. Odis Hollingshead -Inpatient admission -Pain and nausea control as needed -N.p.o. after midnight  Non-insulin-dependent type 2 diabetes-carb controlled diet, with sliding scale insulin, hold home metformin  Combined systolic and diastolic heart failure-patient appears euvolemic, will continue home medications including Cozaar, Aldactone, torsemide and Lipitor  Permanent atrial fibrillation-Xarelto being held preoperatively since 11/7  Hypertension-continue  home hydralazine  Peripheral neuropathy-gabapentin  DVT prophylaxis: Lovenox     Code Status: Full Code  Consults called: None  Admission status: The appropriate patient status for this patient is INPATIENT. Inpatient status is judged to  be reasonable and necessary in order to provide the required intensity of service to ensure the patient's safety. [name] presenting symptoms, physical exam findings, and initial radiographic and laboratory data in the context of their chronic comorbidities is felt to place them at high risk for further clinical deterioration. Furthermore, it is not anticipated that the patient will be medically stable for discharge from the hospital within 2 midnights of admission.    I certify that at the point of admission it is my clinical judgment that the patient will require inpatient hospital care spanning beyond 2 midnights from the point of admission due to high intensity of service, high risk for further deterioration and high frequency of surveillance required  Time spent: 59 minutes  Macauley Mossberg Sharlette Dense MD [facility] Pager [phone]  If 7PM-7AM, please contact night-coverage [email or web address] Password [email or web address]  06/25/2023, 2:19 PM

## 2023-06-26 ENCOUNTER — Encounter (HOSPITAL_COMMUNITY): Payer: Self-pay | Admitting: Internal Medicine

## 2023-06-26 ENCOUNTER — Ambulatory Visit (HOSPITAL_COMMUNITY): Admission: RE | Admit: 2023-06-26 | Payer: Medicare Other | Source: Home / Self Care | Admitting: Orthopaedic Surgery

## 2023-06-26 ENCOUNTER — Other Ambulatory Visit: Payer: Self-pay

## 2023-06-26 ENCOUNTER — Inpatient Hospital Stay (HOSPITAL_COMMUNITY): Payer: Medicare Other | Admitting: Physician Assistant

## 2023-06-26 ENCOUNTER — Encounter (HOSPITAL_COMMUNITY)
Disposition: A | Discharge status: Home Health | Payer: Self-pay | Source: Home / Self Care | Attending: Internal Medicine

## 2023-06-26 ENCOUNTER — Inpatient Hospital Stay (HOSPITAL_COMMUNITY): Payer: Medicare Other

## 2023-06-26 DIAGNOSIS — E119 Type 2 diabetes mellitus without complications: Secondary | ICD-10-CM

## 2023-06-26 DIAGNOSIS — S82841A Displaced bimalleolar fracture of right lower leg, initial encounter for closed fracture: Secondary | ICD-10-CM

## 2023-06-26 DIAGNOSIS — S93431A Sprain of tibiofibular ligament of right ankle, initial encounter: Secondary | ICD-10-CM | POA: Diagnosis not present

## 2023-06-26 DIAGNOSIS — S82891A Other fracture of right lower leg, initial encounter for closed fracture: Secondary | ICD-10-CM | POA: Diagnosis not present

## 2023-06-26 DIAGNOSIS — S82851A Displaced trimalleolar fracture of right lower leg, initial encounter for closed fracture: Secondary | ICD-10-CM | POA: Diagnosis not present

## 2023-06-26 HISTORY — PX: ORIF ANKLE FRACTURE: SHX5408

## 2023-06-26 HISTORY — DX: Pneumonia, unspecified organism: J18.9

## 2023-06-26 HISTORY — DX: Anesthesia of skin: R20.0

## 2023-06-26 HISTORY — DX: Unspecified hearing loss, unspecified ear: H91.90

## 2023-06-26 HISTORY — DX: Personal history of other medical treatment: Z92.89

## 2023-06-26 LAB — GLUCOSE, CAPILLARY
Glucose-Capillary: 106 mg/dL — ABNORMAL HIGH (ref 70–99)
Glucose-Capillary: 125 mg/dL — ABNORMAL HIGH (ref 70–99)
Glucose-Capillary: 212 mg/dL — ABNORMAL HIGH (ref 70–99)
Glucose-Capillary: 249 mg/dL — ABNORMAL HIGH (ref 70–99)

## 2023-06-26 LAB — CBC
HCT: 44.8 % (ref 36.0–46.0)
Hemoglobin: 14.4 g/dL (ref 12.0–15.0)
MCH: 30.4 pg (ref 26.0–34.0)
MCHC: 32.1 g/dL (ref 30.0–36.0)
MCV: 94.7 fL (ref 80.0–100.0)
Platelets: 112 10*3/uL — ABNORMAL LOW (ref 150–400)
RBC: 4.73 MIL/uL (ref 3.87–5.11)
RDW: 16.5 % — ABNORMAL HIGH (ref 11.5–15.5)
WBC: 6.6 10*3/uL (ref 4.0–10.5)
nRBC: 0 % (ref 0.0–0.2)

## 2023-06-26 LAB — MAGNESIUM: Magnesium: 2 mg/dL (ref 1.7–2.4)

## 2023-06-26 LAB — HEMOGLOBIN A1C
Hgb A1c MFr Bld: 5.6 % (ref 4.8–5.6)
Mean Plasma Glucose: 114.02 mg/dL

## 2023-06-26 LAB — BASIC METABOLIC PANEL
Anion gap: 9 (ref 5–15)
BUN: 24 mg/dL — ABNORMAL HIGH (ref 8–23)
CO2: 19 mmol/L — ABNORMAL LOW (ref 22–32)
Calcium: 9.1 mg/dL (ref 8.9–10.3)
Chloride: 112 mmol/L — ABNORMAL HIGH (ref 98–111)
Creatinine, Ser: 1.08 mg/dL — ABNORMAL HIGH (ref 0.44–1.00)
GFR, Estimated: 53 mL/min — ABNORMAL LOW (ref >60)
Glucose, Bld: 117 mg/dL — ABNORMAL HIGH (ref 70–99)
Potassium: 3.3 mmol/L — ABNORMAL LOW (ref 3.5–5.1)
Sodium: 140 mmol/L (ref 135–145)

## 2023-06-26 SURGERY — OPEN REDUCTION INTERNAL FIXATION (ORIF) ANKLE FRACTURE
Anesthesia: General | Site: Ankle | Laterality: Right

## 2023-06-26 MED ORDER — FENTANYL CITRATE (PF) 100 MCG/2ML IJ SOLN
INTRAMUSCULAR | Status: AC
  Filled 2023-06-26: qty 2

## 2023-06-26 MED ORDER — ONDANSETRON HCL 4 MG/2ML IJ SOLN
INTRAMUSCULAR | Status: AC
  Filled 2023-06-26: qty 2

## 2023-06-26 MED ORDER — LIDOCAINE 2% (20 MG/ML) 5 ML SYRINGE
INTRAMUSCULAR | Status: DC | PRN
  Administered 2023-06-26: 40 mg via INTRAVENOUS

## 2023-06-26 MED ORDER — ORAL CARE MOUTH RINSE
15.0000 mL | Freq: Once | OROMUCOSAL | Status: AC
  Administered 2023-06-26: 15 mL via OROMUCOSAL

## 2023-06-26 MED ORDER — HYDRALAZINE HCL 20 MG/ML IJ SOLN: 20.0000 mg | Freq: Three times a day (TID) | INTRAMUSCULAR | Status: DC | PRN

## 2023-06-26 MED ORDER — CHLORHEXIDINE GLUCONATE 0.12 % MT SOLN
15.0000 mL | Freq: Once | OROMUCOSAL | Status: AC
  Administered 2023-06-26: 15 mL via OROMUCOSAL
  Filled 2023-06-26: qty 15

## 2023-06-26 MED ORDER — FENTANYL CITRATE PF 50 MCG/ML IJ SOSY: 25.0000 ug | PREFILLED_SYRINGE | INTRAMUSCULAR | Status: DC | PRN

## 2023-06-26 MED ORDER — DEXAMETHASONE SODIUM PHOSPHATE 10 MG/ML IJ SOLN
INTRAMUSCULAR | Status: DC | PRN
  Administered 2023-06-26: 10 mg

## 2023-06-26 MED ORDER — FENTANYL CITRATE (PF) 100 MCG/2ML IJ SOLN
INTRAMUSCULAR | Status: DC | PRN
  Administered 2023-06-26: 50 ug via INTRAVENOUS

## 2023-06-26 MED ORDER — DEXAMETHASONE SODIUM PHOSPHATE 10 MG/ML IJ SOLN: INTRAMUSCULAR | Status: DC | PRN

## 2023-06-26 MED ORDER — VANCOMYCIN HCL 1000 MG IV SOLR
INTRAVENOUS | Status: AC
  Filled 2023-06-26: qty 20

## 2023-06-26 MED ORDER — 0.9 % SODIUM CHLORIDE (POUR BTL) OPTIME
TOPICAL | Status: DC | PRN
  Administered 2023-06-26: 1000 mL

## 2023-06-26 MED ORDER — ACETAMINOPHEN 10 MG/ML IV SOLN
1000.0000 mg | Freq: Once | INTRAVENOUS | Status: DC | PRN
Start: 2023-06-26 — End: 2023-06-26

## 2023-06-26 MED ORDER — DEXAMETHASONE SODIUM PHOSPHATE 10 MG/ML IJ SOLN
INTRAMUSCULAR | Status: DC | PRN
  Administered 2023-06-26: 4 mg via INTRAVENOUS

## 2023-06-26 MED ORDER — ROCURONIUM BROMIDE 10 MG/ML (PF) SYRINGE
PREFILLED_SYRINGE | INTRAVENOUS | Status: AC
  Filled 2023-06-26: qty 10

## 2023-06-26 MED ORDER — VANCOMYCIN HCL 1000 MG IV SOLR
INTRAVENOUS | Status: DC | PRN
  Administered 2023-06-26: 1000 mg via TOPICAL

## 2023-06-26 MED ORDER — ONDANSETRON HCL 4 MG/2ML IJ SOLN: 4.0000 mg | Freq: Once | INTRAMUSCULAR | Status: DC | PRN

## 2023-06-26 MED ORDER — DEXAMETHASONE SODIUM PHOSPHATE 10 MG/ML IJ SOLN
INTRAMUSCULAR | Status: AC
  Filled 2023-06-26: qty 1

## 2023-06-26 MED ORDER — POTASSIUM CHLORIDE 10 MEQ/100ML IV SOLN
10.0000 meq | INTRAVENOUS | Status: AC
  Administered 2023-06-26 (×3): 10 meq via INTRAVENOUS
  Filled 2023-06-26 (×3): qty 100

## 2023-06-26 MED ORDER — PHENYLEPHRINE HCL-NACL 20-0.9 MG/250ML-% IV SOLN
INTRAVENOUS | Status: DC | PRN
  Administered 2023-06-26: 10 ug/min via INTRAVENOUS

## 2023-06-26 MED ORDER — ROCURONIUM BROMIDE 10 MG/ML (PF) SYRINGE
PREFILLED_SYRINGE | INTRAVENOUS | Status: DC | PRN
  Administered 2023-06-26: 10 mg via INTRAVENOUS
  Administered 2023-06-26: 70 mg via INTRAVENOUS
  Administered 2023-06-26: 20 mg via INTRAVENOUS

## 2023-06-26 MED ORDER — CLONIDINE HCL (ANALGESIA) 100 MCG/ML EP SOLN
EPIDURAL | Status: DC | PRN
  Administered 2023-06-26: 100 ug

## 2023-06-26 MED ORDER — ROPIVACAINE HCL 5 MG/ML IJ SOLN
INTRAMUSCULAR | Status: DC | PRN
  Administered 2023-06-26: 30 mL via PERINEURAL

## 2023-06-26 MED ORDER — CHLORHEXIDINE GLUCONATE 0.12 % MT SOLN
15.0000 mL | Freq: Once | OROMUCOSAL | Status: AC
  Filled 2023-06-26: qty 15

## 2023-06-26 MED ORDER — PROPOFOL 10 MG/ML IV BOLUS
INTRAVENOUS | Status: AC
  Filled 2023-06-26: qty 20

## 2023-06-26 MED ORDER — FENTANYL CITRATE PF 50 MCG/ML IJ SOSY
100.0000 ug | PREFILLED_SYRINGE | Freq: Once | INTRAMUSCULAR | Status: AC
  Administered 2023-06-26: 50 ug via INTRAVENOUS
  Filled 2023-06-26: qty 2

## 2023-06-26 MED ORDER — OXYCODONE HCL 5 MG/5ML PO SOLN: 5.0000 mg | Freq: Once | ORAL | Status: DC | PRN

## 2023-06-26 MED ORDER — ONDANSETRON HCL 4 MG/2ML IJ SOLN
INTRAMUSCULAR | Status: DC | PRN
  Administered 2023-06-26: 4 mg via INTRAVENOUS

## 2023-06-26 MED ORDER — OXYCODONE HCL 5 MG PO TABS: 5.0000 mg | ORAL_TABLET | Freq: Once | ORAL | Status: DC | PRN

## 2023-06-26 MED ORDER — LACTATED RINGERS IV SOLN: INTRAVENOUS | Status: DC

## 2023-06-26 MED ORDER — ORAL CARE MOUTH RINSE: 15.0000 mL | Freq: Once | OROMUCOSAL | Status: AC

## 2023-06-26 MED ORDER — CEFAZOLIN SODIUM-DEXTROSE 2-4 GM/100ML-% IV SOLN
2.0000 g | Freq: Three times a day (TID) | INTRAVENOUS | Status: AC
  Administered 2023-06-26 – 2023-06-27 (×2): 2 g via INTRAVENOUS
  Filled 2023-06-26 (×2): qty 100

## 2023-06-26 MED ORDER — CHLORHEXIDINE GLUCONATE 4 % EX SOLN
60.0000 mL | Freq: Once | CUTANEOUS | Status: DC
  Filled 2023-06-26: qty 60

## 2023-06-26 MED ORDER — PROPOFOL 10 MG/ML IV BOLUS
INTRAVENOUS | Status: DC | PRN
  Administered 2023-06-26: 50 mg via INTRAVENOUS

## 2023-06-26 MED ORDER — SUGAMMADEX SODIUM 200 MG/2ML IV SOLN
INTRAVENOUS | Status: DC | PRN
  Administered 2023-06-26: 200 mg via INTRAVENOUS

## 2023-06-26 MED ORDER — CEFAZOLIN SODIUM-DEXTROSE 2-4 GM/100ML-% IV SOLN
2.0000 g | INTRAVENOUS | Status: AC
  Administered 2023-06-26: 2 g via INTRAVENOUS
  Filled 2023-06-26: qty 100

## 2023-06-26 MED ORDER — INSULIN ASPART 100 UNIT/ML IJ SOLN: 0.0000 [IU] | INTRAMUSCULAR | Status: DC | PRN

## 2023-06-26 MED ORDER — MIDAZOLAM HCL 2 MG/2ML IJ SOLN
2.0000 mg | Freq: Once | INTRAMUSCULAR | Status: DC
  Filled 2023-06-26: qty 2

## 2023-06-26 MED ORDER — LIDOCAINE HCL (PF) 2 % IJ SOLN
INTRAMUSCULAR | Status: AC
  Filled 2023-06-26: qty 5

## 2023-06-26 MED ORDER — CEFAZOLIN SODIUM-DEXTROSE 2-4 GM/100ML-% IV SOLN: 2.0000 g | Freq: Three times a day (TID) | INTRAVENOUS | Status: DC

## 2023-06-26 MED ORDER — POTASSIUM CHLORIDE 10 MEQ/100ML IV SOLN
10.0000 meq | INTRAVENOUS | Status: AC
  Administered 2023-06-26: 10 meq via INTRAVENOUS
  Filled 2023-06-26 (×2): qty 100

## 2023-06-26 SURGICAL SUPPLY — 62 items
APL PRP STRL LF DISP 70% ISPRP (MISCELLANEOUS) ×4
BAG SPEC THK2 15X12 ZIP CLS (MISCELLANEOUS) ×2
BAG ZIPLOCK 12X15 (MISCELLANEOUS) ×2 IMPLANT
BANDAGE ESMARK 6X9 LF (GAUZE/BANDAGES/DRESSINGS) ×3 IMPLANT
BIT DRILL 2.4X140 LONG SOLID (BIT) ×1 IMPLANT
BIT DRILL 2.8 (BIT) ×2
BIT DRILL LNG 140X2.8XSLD (BIT) ×1 IMPLANT
BIT DRL LNG 140X2.8XSLD (BIT) ×2
BLADE SURG 15 STRL LF DISP TIS (BLADE) ×4 IMPLANT
BLADE SURG 15 STRL SS (BLADE) ×4
BNDG CMPR 6 X 5 YARDS HK CLSR (GAUZE/BANDAGES/DRESSINGS) ×2
BNDG CMPR 9X4 STRL LF SNTH (GAUZE/BANDAGES/DRESSINGS) ×6
BNDG CMPR 9X6 STRL LF SNTH (GAUZE/BANDAGES/DRESSINGS) ×6
BNDG CMPR MED 10X6 ELC LF (GAUZE/BANDAGES/DRESSINGS) ×2
BNDG ELASTIC 6INX 5YD STR LF (GAUZE/BANDAGES/DRESSINGS) ×2 IMPLANT
BNDG ELASTIC 6X10 VLCR STRL LF (GAUZE/BANDAGES/DRESSINGS) ×1 IMPLANT
BNDG ESMARK 4X9 LF (GAUZE/BANDAGES/DRESSINGS) ×3 IMPLANT
BNDG ESMARK 6X9 LF (GAUZE/BANDAGES/DRESSINGS) ×6
BNDG GAUZE DERMACEA FLUFF 4 (GAUZE/BANDAGES/DRESSINGS) ×2 IMPLANT
BNDG GZE DERMACEA 4 6PLY (GAUZE/BANDAGES/DRESSINGS) ×4
BOOT STEPPER DURA MED (SOFTGOODS) ×1 IMPLANT
CHLORAPREP W/TINT 26 (MISCELLANEOUS) ×4 IMPLANT
COVER SURGICAL LIGHT HANDLE (MISCELLANEOUS) ×2 IMPLANT
CUFF TOURN SGL QUICK 34 (TOURNIQUET CUFF) ×2
CUFF TRNQT CYL 34X4.125X (TOURNIQUET CUFF) ×2 IMPLANT
DRAPE C-ARM 42X120 X-RAY (DRAPES) ×1 IMPLANT
DRAPE C-ARMOR (DRAPES) ×2 IMPLANT
DRAPE EXTREMITY T 121X128X90 (DISPOSABLE) ×2 IMPLANT
DRAPE U-SHAPE 47X51 STRL (DRAPES) ×2 IMPLANT
DRSG EMULSION OIL 3X16 NADH (GAUZE/BANDAGES/DRESSINGS) ×1 IMPLANT
DRSG MEPITEL 4X7.2 (GAUZE/BANDAGES/DRESSINGS) ×2 IMPLANT
ELECT REM PT RETURN 15FT ADLT (MISCELLANEOUS) ×2 IMPLANT
GAUZE PAD ABD 8X10 STRL (GAUZE/BANDAGES/DRESSINGS) ×3 IMPLANT
GAUZE SPONGE 4X4 12PLY STRL (GAUZE/BANDAGES/DRESSINGS) ×2 IMPLANT
GLOVE BIOGEL PI IND STRL 7.5 (GLOVE) ×4 IMPLANT
GLOVE INDICATOR 7.5 STRL GRN (GLOVE) ×2 IMPLANT
GOWN STRL REUS W/ TWL LRG LVL3 (GOWN DISPOSABLE) ×3 IMPLANT
GOWN STRL REUS W/TWL LRG LVL3 (GOWN DISPOSABLE) ×4
K-WIRE SNGL END 1.2X150 (MISCELLANEOUS) ×4
KIT BASIN OR (CUSTOM PROCEDURE TRAY) ×2 IMPLANT
KIT TURNOVER KIT A (KITS) IMPLANT
KWIRE SNGL END 1.2X150 (MISCELLANEOUS) ×2 IMPLANT
NS IRRIG 1000ML POUR BTL (IV SOLUTION) ×2 IMPLANT
PACK TOTAL JOINT (CUSTOM PROCEDURE TRAY) ×2 IMPLANT
PLATE FIBULAR STRT 10H (Plate) ×1 IMPLANT
PROTECTOR NERVE ULNAR (MISCELLANEOUS) ×2 IMPLANT
SCREW 3.5X16 NONLOCKING (Screw) ×1 IMPLANT
SCREW LOCK GORILLA 4.2X52 (Screw) ×1 IMPLANT
SCREW LOCK PLATE R3 3.5X12 (Screw) ×2 IMPLANT
SCREW LOCK PLATE R3 3.5X16 (Screw) ×1 IMPLANT
SCREW NL R3CON 4.2X50 (Screw) ×1 IMPLANT
SCREW NON LOCKING 3.5X14 (Screw) ×1 IMPLANT
SPLINT PLASTER CAST XFAST 5X30 (CAST SUPPLIES) ×1 IMPLANT
STAPLER VISISTAT (STAPLE) ×1 IMPLANT
STOCKINETTE 4X48 STRL (DRAPES) ×2 IMPLANT
SUCTION TUBE FRAZIER 10FR DISP (SUCTIONS) ×2 IMPLANT
SUT ETHILON 2 0 FS 18 (SUTURE) ×1 IMPLANT
SUT ETHILON 3 0 PS 1 (SUTURE) ×4 IMPLANT
SUT VIC AB 2-0 SH 27 (SUTURE) ×2
SUT VIC AB 2-0 SH 27XBRD (SUTURE) ×2 IMPLANT
SUT VIC AB 3-0 SH 27 (SUTURE)
SUT VIC AB 3-0 SH 27X BRD (SUTURE) ×2 IMPLANT

## 2023-06-26 NOTE — TOC Initial Note (Signed)
Transition of Care [facility]) - Initial/Assessment Note    Patient Details  Name: [name] [name] MRN: [id number] Date of Birth: 1946/06/24  [facility] CM/SW Contact:    Howell Rucks, RN Phone Number: 06/26/2023, 10:49 AM  Clinical Narrative:   Pt off floor for surgical procedure. TOC will continue to follow.                      Patient Goals and CMS Choice            Expected Discharge Plan and Services                                              Prior Living Arrangements/Services                       Activities of Daily Living   ADL Screening (condition at time of admission) Independently performs ADLs?: Yes (appropriate for developmental age) Is the patient deaf or have difficulty hearing?: No Does the patient have difficulty seeing, even when wearing glasses/contacts?: No Does the patient have difficulty concentrating, remembering, or making decisions?: No  Permission Sought/Granted                  Emotional Assessment              Admission diagnosis:  Ankle fracture [S82.899A] Patient Active Problem List   Diagnosis Date Noted   Ankle fracture 06/25/2023   Dysuria 12/29/2022   Cough productive of purulent sputum 12/29/2022   Type 2 diabetes mellitus with hyperlipidemia (HCC) 12/14/2022   Acute cough 12/12/2022   Ceruminosis, bilateral 06/17/2022   Flu vaccine need 06/17/2022   Chronic combined systolic and diastolic CHF (congestive heart failure) (HCC) 06/17/2022   Encounter for general adult medical examination with abnormal findings 06/16/2022   Chronic anticoagulation 06/06/2022   Mitral regurgitation 06/06/2022   Controlled type 2 diabetes mellitus without complication, without long-term current use of insulin (HCC) 06/06/2022   Pacemaker 02/22/2022   Hypertensive urgency 06/08/2021   Obstructive sleep apnea 09/21/2020   Obesity (BMI 30.0-34.9) 02/10/2020   COPD (chronic obstructive pulmonary  disease) (HCC) 10/26/2019   Thrombocytopenia (HCC) 09/14/2017   Medication noncompliance due to cognitive impairment 09/06/2017   Persistent atrial fibrillation (HCC) 08/04/2017   Cardiomyopathy, ischemic 02/08/2017   CKD (chronic kidney disease), stage III (HCC) 01/30/2017   Coronary artery disease due to lipid rich plaque    Essential hypertension    PCP:  [name], MD Pharmacy:   [facility] [phone] Ginette Otto, [place] - [address] AT [facility] [address] [place] [address] Phone: [phone] Fax: [phone]  [facility] - [place], [place] - [address] [place] [address] Phone: [phone] Fax: [phone]  [facility] [address] [place] [address] Phone: [phone] Fax: [phone]     Social Determinants of Health (SDOH) Social History: SDOH Screenings   Food Insecurity: No Food Insecurity (06/25/2023)  Housing: Low Risk  (06/25/2023)  Transportation Needs: No Transportation Needs (06/25/2023)  Utilities: Not At Risk (06/25/2023)  Depression (PHQ2-9): Low Risk  (12/29/2022)  Financial Resource Strain: Low Risk  (06/07/2018)  Recent Concern: Financial Resource Strain - Medium Risk (05/21/2018)  Stress: Stress Concern  Present (02/22/2023)  Tobacco Use: High Risk (06/26/2023)   SDOH Interventions:     Readmission Risk Interventions    02/12/2021   10:29 AM 12/09/2020   12:39 PM  Readmission Risk Prevention Plan  Transportation Screening Complete Complete  PCP or Specialist Appt within 3-5 Days Complete Complete  HRI or Home Care Consult Complete Complete  Social Work Consult for Recovery Care Planning/Counseling Complete Complete  Palliative Care Screening Not Applicable Complete  Medication Review Oceanographer) Complete Complete

## 2023-06-26 NOTE — Progress Notes (Signed)
Patient complaint left side chest pain, rate 6 on 0-10 scale. The pain not radiated to other area. The pt only willing took half dose hydrocodone/acetaminophen. RN wasted other half dose with Angle, RN and tossed into Steri cycle container.

## 2023-06-26 NOTE — Progress Notes (Signed)
PROGRESS NOTE  [name] [name] WUJ:[id number] DOB: 1946/04/13 DOA: 06/25/2023 PCP: [name], MD  HPI/Recap of past 24 hours: [name] [name] is a 77 y.o. female with medical history significant for hypertension, permanent AF, CAD status post CABG x 2V, chronic combined congestive heart failure with ischemic cardiomyopathy, DM 2, admitted to the hospital for elective right ankle ORIF with Dr. Odis Hollingshead on 11/11. She had a mechanical fall at home on 11/3 and sustained an ankle fracture. She had perioperative workup and clearance by anesthesiology and her cardiologist.  She has been holding her Xarelto since 11/7.  She also developed a nontender nondraining blister on the dorsum of the right foot, but otherwise and is in her usual state of health.  [facility] assisted with admission due to complex medical problems.    Today, saw patient prior to surgery, denies any new complaints except for some dry mouth.  Denies any chest pain, shortness of breath, abdominal pain, nausea/vomiting, fever/chills.  Noted unchanged large nondraining blister on the dorsum of her right foot.     Assessment/Plan: Principal Problem:   Ankle fracture  Right ankle fracture s/p ORIF on 11/11 Further management per orthopedics, Dr. Odis Hollingshead DVT PPx, pain management as per orthopedics PT/OT  Hypokalemia Replace as needed  Hypertension Uncontrolled, worsened by pain/anxiety Continue home hydralazine, Cozaar, Aldactone, IV hydralazine as needed  Diabetes mellitus type 2, controlled Peripheral neuropathy Last A1c 5.6 SSI, Accu-Cheks, hypoglycemic protocol Hold home metformin for now Continue gabapentin   Chronic systolic and diastolic CHF Appears euvolemic Echo showed EF of 25 to 30%, global hypokinesis Continue home meds cozaar, Aldactone, torsemide  History of CAD/hyperlipidemia Currently chest pain-free Continue Lipitor   Permanent atrial fibrillation Rate controlled Xarelto,  currently being held preoperatively since 11/7          Estimated body mass index is 29.02 kg/m as calculated from the following:   Height as of this encounter: 5' 7.5" (1.715 m).   Weight as of this encounter: 85.3 kg.     Code Status: Full  Family Communication: None at bedside  Disposition Plan: Status is: Inpatient Remains inpatient appropriate because: Level of care      Consultants: Orthopedics  Procedures: ORIF  Antimicrobials: None  DVT prophylaxis: SCDs   Objective: Vitals:   06/26/23 1055 06/26/23 1100 06/26/23 1105 06/26/23 1110  BP: (!) 195/84 (!) 207/81 (!) 185/99 (!) 183/85  Pulse: 69 72 61 80  Resp: (!) 21 14 14 13   Temp:      TempSrc:      SpO2: 97% 97% 96% 97%  Weight:      Height:        Intake/Output Summary (Last 24 hours) at 06/26/2023 1314 Last data filed at 06/26/2023 1311 Gross per 24 hour  Intake 120 ml  Output 10 ml  Net 110 ml   Filed Weights   06/25/23 1614 06/26/23 0956  Weight: 85.3 kg 85.3 kg    Exam: General: NAD  Cardiovascular: S1, S2 present Respiratory: Diminished breath sounds bilaterally Abdomen: Soft, nontender, nondistended, bowel sounds present Musculoskeletal: No bilateral pedal edema noted, noted large nondraining blister on the dorsum of the right foot, noted Ace wrap around RLE Skin: As noted Psychiatry: Normal mood     Data Reviewed: CBC: Recent Labs  Lab 06/26/23 0409  WBC 6.6  HGB 14.4  HCT 44.8  MCV 94.7  PLT 112*   Basic Metabolic Panel: Recent Labs  Lab 06/26/23 0409  NA 140  K  3.3*  CL 112*  CO2 19*  GLUCOSE 117*  BUN 24*  CREATININE 1.08*  CALCIUM 9.1  MG 2.0   GFR: Estimated Creatinine Clearance: 48.7 mL/min (A) (by C-G formula based on SCr of 1.08 mg/dL (H)). Liver Function Tests: No results for input(s): "AST", "ALT", "ALKPHOS", "BILITOT", "PROT", "ALBUMIN" in the last 168 hours. No results for input(s): "LIPASE", "AMYLASE" in the last 168 hours. No results  for input(s): "AMMONIA" in the last 168 hours. Coagulation Profile: No results for input(s): "INR", "PROTIME" in the last 168 hours. Cardiac Enzymes: No results for input(s): "CKTOTAL", "CKMB", "CKMBINDEX", "TROPONINI" in the last 168 hours. BNP (last 3 results) Recent Labs    12/12/22 1656  PROBNP 764.0*   HbA1C: Recent Labs    06/26/23 0409  HGBA1C 5.6   CBG: Recent Labs  Lab 06/25/23 1635 06/25/23 2124 06/26/23 0727  GLUCAP 121* 91 106*   Lipid Profile: No results for input(s): "CHOL", "HDL", "LDLCALC", "TRIG", "CHOLHDL", "LDLDIRECT" in the last 72 hours. Thyroid Function Tests: No results for input(s): "TSH", "T4TOTAL", "FREET4", "T3FREE", "THYROIDAB" in the last 72 hours. Anemia Panel: No results for input(s): "VITAMINB12", "FOLATE", "FERRITIN", "TIBC", "IRON", "RETICCTPCT" in the last 72 hours. Urine analysis:    Component Value Date/Time   COLORURINE AMBER (A) 06/06/2022 0830   APPEARANCEUR CLEAR 06/06/2022 0830   LABSPEC >1.030 (H) 06/06/2022 0830   PHURINE 5.5 06/06/2022 0830   GLUCOSEU NEGATIVE 06/06/2022 0830   GLUCOSEU NEGATIVE 03/16/2022 1343   HGBUR TRACE (A) 06/06/2022 0830   BILIRUBINUR negative 12/29/2022 1620   KETONESUR NEGATIVE 06/06/2022 0830   PROTEINUR Positive (A) 12/29/2022 1620   PROTEINUR 100 (A) 06/06/2022 0830   UROBILINOGEN 0.2 12/29/2022 1620   UROBILINOGEN 1.0 03/16/2022 1343   NITRITE negative 12/29/2022 1620   NITRITE NEGATIVE 06/06/2022 0830   LEUKOCYTESUR Negative 12/29/2022 1620   LEUKOCYTESUR NEGATIVE 06/06/2022 0830   Sepsis Labs: @LABRCNTIP (procalcitonin:4,lacticidven:4)  )No results found for this or any previous visit (from the past 240 hour(s)).    Studies: DG C-Arm 1-60 Min-No Report  Result Date: 06/26/2023 Fluoroscopy was utilized by the requesting physician.  No radiographic interpretation.   DG C-Arm 1-60 Min-No Report  Result Date: 06/26/2023 Fluoroscopy was utilized by the requesting physician.  No  radiographic interpretation.    Scheduled Meds:  [MAR Hold] allopurinol  100 mg Oral Daily   [MAR Hold] atorvastatin  10 mg Oral Daily   chlorhexidine  60 mL Topical Once   [MAR Hold] enoxaparin (LOVENOX) injection  40 mg Subcutaneous Q24H   [MAR Hold] gabapentin  100 mg Oral QHS   [MAR Hold] hydrALAZINE  50 mg Oral Q8H   [MAR Hold] insulin aspart  0-15 Units Subcutaneous TID WC   [MAR Hold] insulin aspart  0-5 Units Subcutaneous QHS   [MAR Hold] losartan  50 mg Oral Daily   midazolam  2 mg Intravenous Once   [MAR Hold] pantoprazole  40 mg Oral Daily   [MAR Hold] spironolactone  25 mg Oral Daily   [MAR Hold] torsemide  20 mg Oral Daily    Continuous Infusions:  lactated ringers 600 mL/hr at 06/26/23 1304     LOS: 1 day     Briant Cedar, MD [facility]  If 7PM-7AM, please contact night-coverage [email or web address] 06/26/2023, 1:14 PM

## 2023-06-26 NOTE — Transfer of Care (Signed)
Immediate Anesthesia Transfer of Care Note  Patient: [name] [name]  Procedure(s) Performed: OPEN REDUCTION INTERNAL FIXATION (ORIF) BIMALLEOLAR ANKLE FRACTURE (Right: Ankle)  Patient Location: PACU  Anesthesia Type:General  Level of Consciousness: drowsy and patient cooperative  Airway & Oxygen Therapy: Patient Spontanous Breathing and Patient connected to face mask oxygen  Post-op Assessment: Report given to RN and Post -op Vital signs reviewed and stable  Post vital signs: Reviewed and stable  Last Vitals:  Vitals Value Taken Time  BP 172/100 06/26/23 1322  Temp    Pulse 64 06/26/23 1325  Resp 21 06/26/23 1325  SpO2 99 % 06/26/23 1325  Vitals shown include unfiled device data.  Last Pain:  Vitals:   06/26/23 1110  TempSrc:   PainSc: 0-No pain      Patients Stated Pain Goal: 0 (06/26/23 0215)  Complications: No notable events documented.

## 2023-06-26 NOTE — Anesthesia Procedure Notes (Signed)
Anesthesia Regional Block: Adductor canal block   Pre-Anesthetic Checklist: , timeout performed,  Correct Patient, Correct Site, Correct Laterality,  Correct Procedure, Correct Position, site marked,  Risks and benefits discussed,  Surgical consent,  Pre-op evaluation,  At surgeon's request and post-op pain management  Laterality: Right  Prep: Maximum Sterile Barrier Precautions used, chloraprep       Needles:  Injection technique: Single-shot  Needle Type: Echogenic Needle      Needle Gauge: 20     Additional Needles:   Procedures:,,,, ultrasound used (permanent image in chart),,    Narrative:  Start time: 06/26/2023 10:40 AM End time: 06/26/2023 10:42 AM Injection made incrementally with aspirations every 5 mL.  Performed by: Personally  Anesthesiologist: Mariann Barter, MD

## 2023-06-26 NOTE — Anesthesia Procedure Notes (Signed)
Anesthesia Regional Block: Popliteal block   Pre-Anesthetic Checklist: , timeout performed,  Correct Patient, Correct Site, Correct Laterality,  Correct Procedure, Correct Position, site marked,  Risks and benefits discussed,  Surgical consent,  Pre-op evaluation,  At surgeon's request and post-op pain management  Laterality: Right  Prep: Maximum Sterile Barrier Precautions used, chloraprep       Needles:  Injection technique: Single-shot  Needle Type: Echogenic Needle      Needle Gauge: 20     Additional Needles:   Procedures:,,,, ultrasound used (permanent image in chart),,    Narrative:  Start time: 06/26/2023 10:42 AM End time: 06/26/2023 10:45 AM Injection made incrementally with aspirations every 5 mL.  Performed by: Personally  Anesthesiologist: Mariann Barter, MD

## 2023-06-26 NOTE — Plan of Care (Signed)
  Problem: Education: Goal: Knowledge of General Education information will improve Description: Including pain rating scale, medication(s)/side effects and non-pharmacologic comfort measures Outcome: Progressing   Problem: Health Behavior/Discharge Planning: Goal: Ability to manage health-related needs will improve Outcome: Progressing   Problem: Clinical Measurements: Goal: Ability to maintain clinical measurements within normal limits will improve Outcome: Progressing Goal: Will remain free from infection Outcome: Progressing Goal: Diagnostic test results will improve Outcome: Progressing   Problem: Activity: Goal: Risk for activity intolerance will decrease Outcome: Progressing   Problem: Nutrition: Goal: Adequate nutrition will be maintained Outcome: Progressing   Problem: Coping: Goal: Level of anxiety will decrease Outcome: Progressing   Problem: Elimination: Goal: Will not experience complications related to bowel motility Outcome: Progressing Goal: Will not experience complications related to urinary retention Outcome: Progressing   Problem: Pain Management: Goal: General experience of comfort will improve Outcome: Progressing   Problem: Safety: Goal: Ability to remain free from injury will improve Outcome: Progressing   Problem: Skin Integrity: Goal: Risk for impaired skin integrity will decrease Outcome: Progressing   Problem: Fluid Volume: Goal: Ability to maintain a balanced intake and output will improve Outcome: Progressing   Problem: Metabolic: Goal: Ability to maintain appropriate glucose levels will improve Outcome: Progressing   Problem: Nutritional: Goal: Maintenance of adequate nutrition will improve Outcome: Progressing   Problem: Skin Integrity: Goal: Risk for impaired skin integrity will decrease Outcome: Progressing   Problem: Tissue Perfusion: Goal: Adequacy of tissue perfusion will improve Outcome: Progressing   Problem:  Clinical Measurements: Goal: Respiratory complications will improve Outcome: Adequate for Discharge Goal: Cardiovascular complication will be avoided Outcome: Adequate for Discharge

## 2023-06-26 NOTE — Op Note (Signed)
06/25/2023 - 06/26/2023  10:32 PM   PATIENT: [name] [name]  77 y.o. female  MRN: [id number]   PRE-OPERATIVE DIAGNOSIS:   Closed trimalleolar fracture of right ankle   POST-OPERATIVE DIAGNOSIS:   Same   PROCEDURE: 1] ORIF right trimalleolar ankle fracture without fixation of posterior malleolus 2] ORIF right ankle syndesmosis   SURGEON:  Netta Cedars, MD   ASSISTANT: None   ANESTHESIA: General, regional   EBL: Minimal   TOURNIQUET:    Total Tourniquet Time Documented: Thigh (Right) - 51 minutes Total: Thigh (Right) - 51 minutes    COMPLICATIONS: None apparent   DISPOSITION: Extubated, awake and stable to recovery.   INDICATION FOR PROCEDURE: The patient presented with above diagnosis.  We discussed the diagnosis, alternative treatment options, risks and benefits of the above surgical intervention, as well as alternative non-operative treatments. All questions/concerns were addressed and the patient/family demonstrated appropriate understanding of the diagnosis, the procedure, the postoperative course, and overall prognosis. The patient wished to proceed with surgical intervention and signed an informed surgical consent as such, in each others presence prior to surgery.   PROCEDURE IN DETAIL: After preoperative consent was obtained and the correct operative site was identified, the patient was brought to the operating room supine on stretcher and transferred onto operating table. General anesthesia was induced. Preoperative antibiotics were administered. Surgical timeout was taken. The patient was then positioned supine with an ipsilateral hip bump. The operative lower extremity was prepped and draped in standard sterile fashion with a tourniquet around the thigh. The extremity was exsanguinated and the tourniquet was inflated to 275 mmHg.  There was a large area of resolving traumatic blistering over lateral leg. There was also a large dorsal foot serous  blister in area of splint made by ER. There were also resolving serous anteromedial and direct medial blisters. These were decompressed with prepping the leg.  Therefore, a distal lateral incision was made over intact skin overlying the distal lateral malleolus. Dissection was carried down to the level of the fibula and the fracture site identified. The superficial peroneal nerve was identified and protected throughout the procedure. The fibula was noted to be shortened with interposed periosteum. The fibula was brought out to length. The fibula fracture was debrided using a freer elevator. Reduction maneuver was performed thru plate using traction and noncortical screws. In this manner, the fibula length was restored and fracture reduced. A lag screw was not placed given the orientation of fracture lines and comminution. Due to poor bone quality and extensive comminution at the fracture site, it was decided to use a locking distal fibula plate. We then selected a [employer] locking plate to match the anatomy of the distal fibula and placed it laterally. This was slid up the shaft of the fibula and a separate proximal lateral incision was made to percutaneously fixate the plate proximal to the fracture site. This plate was implanted under intraoperative fluoroscopy with a combination of distal locking screws and proximal cortical & locking screws.  We then turned to the medial malleolar fracture. A medial incision was made and dissection carried down to level of bone. After obtaining reduction under fluoroscopy, a Kirschner wire was placed to secure this reduction and to serve as guide for a cannulated screw. However, the fragment was far too comminuted to receive internal fixation and therefore was openly reduced without internal fixation.  A manual external rotation stress radiograph was obtained and demonstrated widening of the ankle mortise. Given this intraoperative finding as  well as preoperative  subluxation, it was decided to reduce and fix the syndesmosis. Therefore multiple screws were implanted through the fibula plate in appropriate fashion to fix the syndesmosis (non locking and locking). Screw position was verified along anteromedial tibial cortex by fluoroscopy. A repeat stress radiograph showed complete stability of the ankle mortise to testing.  The surgical sites were thoroughly irrigated. The tourniquet was deflated and hemostasis achieved. Betadine and vancomycin powder were applied. The deep layers were closed using 2-0 vicryl. The skin was closed without tension.    The leg was cleaned with saline and sterile dressings with gauze were applied. A well padded bulky wrap with CAM boot was applied to monitor areas of blistering. The patient was awakened from anesthesia and transported to the recovery room in stable condition.    FOLLOW UP PLAN: -transfer to PACU, then return to RNF -STRICT NWB operative extremity, maximum elevation -maintain dressings until follow up -DVT ppx: resume therapeutic anticoagulation POD2 (or per primary team discretion) -follow up as outpatient within 7-10 days for wound check with possible casting pending resolution of blistering -Keflex rx due to extensive blistering periop -staples out in 2-3 weeks in outpatient office   RADIOGRAPHS: AP, lateral, oblique and stress radiographs of the right ankle were obtained intraoperatively. These showed interval reduction and fixation of the fractures. Manual stress radiographs were taken and the joints were noted to be stable following fixation. All hardware is appropriately positioned and of the appropriate lengths. No other acute injuries are noted.   Netta Cedars Orthopaedic Surgery [facility]

## 2023-06-26 NOTE — H&P (Addendum)
H&P Update:  -History and Physical Reviewed  -Patient has been re-examined  -No change in the plan of care  -The risks and benefits were presented and reviewed. The risks due to hardware/suture failure and/or irritation, new/persistent infection, stiffness, nerve/vessel/tendon injury or rerupture of repaired tendon, nonunion/malunion, allograft usage, wound healing issues, development of arthritis, failure of this surgery, possibility of external fixation with delayed definitive surgery, need for further surgery, thromboembolic events, anesthesia/medical complications, amputation, death among others were discussed. The patient acknowledged the explanation, agreed to proceed with the plan and a consent was signed.  (Note: We explicitly discussed her extensive comorbidities entailing a far higher risk of perioperative complications than most other patients. She understands and agrees to proceed as planned.)  [name] [name]

## 2023-06-26 NOTE — Anesthesia Procedure Notes (Signed)
Procedure Name: Intubation Date/Time: 06/26/2023 11:55 AM  Performed by: Sindy Guadeloupe, CRNAPre-anesthesia Checklist: Patient identified, Emergency Drugs available, Suction available, Patient being monitored and Timeout performed Patient Re-evaluated:Patient Re-evaluated prior to induction Oxygen Delivery Method: Circle system utilized Preoxygenation: Pre-oxygenation with 100% oxygen Induction Type: IV induction Ventilation: Mask ventilation without difficulty Laryngoscope Size: Mac and 4 Grade View: Grade I Tube type: Oral Tube size: 7.0 mm Number of attempts: 1 Airway Equipment and Method: Stylet Placement Confirmation: ETT inserted through vocal cords under direct vision, positive ETCO2 and breath sounds checked- equal and bilateral Secured at: 22 cm Tube secured with: Tape Dental Injury: Teeth and Oropharynx as per pre-operative assessment

## 2023-06-26 NOTE — Anesthesia Postprocedure Evaluation (Signed)
Anesthesia Post Note  Patient: JOSSALIN KNECHTEL  Procedure(s) Performed: OPEN REDUCTION INTERNAL FIXATION (ORIF) BIMALLEOLAR ANKLE FRACTURE (Right: Ankle)     Patient location during evaluation: PACU Anesthesia Type: General Level of consciousness: awake and alert Pain management: pain level controlled Vital Signs Assessment: post-procedure vital signs reviewed and stable Respiratory status: spontaneous breathing, nonlabored ventilation, respiratory function stable and patient connected to nasal cannula oxygen Cardiovascular status: blood pressure returned to baseline and stable Postop Assessment: no apparent nausea or vomiting Anesthetic complications: no   No notable events documented.  Last Vitals:  Vitals:   06/26/23 1417 06/26/23 1438  BP: (!) 144/65 (!) 143/71  Pulse: (!) 59 67  Resp:  16  Temp: 36.8 C 36.6 C  SpO2: 99% 98%    Last Pain:  Vitals:   06/26/23 1438  TempSrc: Oral  PainSc:                  Mariann Barter

## 2023-06-27 ENCOUNTER — Telehealth (HOSPITAL_COMMUNITY): Payer: Self-pay | Admitting: Emergency Medicine

## 2023-06-27 ENCOUNTER — Encounter (HOSPITAL_COMMUNITY): Payer: Self-pay | Admitting: Orthopaedic Surgery

## 2023-06-27 DIAGNOSIS — S82891A Other fracture of right lower leg, initial encounter for closed fracture: Secondary | ICD-10-CM | POA: Diagnosis not present

## 2023-06-27 LAB — CBC WITH DIFFERENTIAL/PLATELET
Abs Immature Granulocytes: 0.09 10*3/uL — ABNORMAL HIGH (ref 0.00–0.07)
Basophils Absolute: 0 10*3/uL (ref 0.0–0.1)
Basophils Relative: 0 %
Eosinophils Absolute: 0 10*3/uL (ref 0.0–0.5)
Eosinophils Relative: 0 %
HCT: 44.9 % (ref 36.0–46.0)
Hemoglobin: 14.8 g/dL (ref 12.0–15.0)
Immature Granulocytes: 1 %
Lymphocytes Relative: 5 %
Lymphs Abs: 0.6 10*3/uL — ABNORMAL LOW (ref 0.7–4.0)
MCH: 30.4 pg (ref 26.0–34.0)
MCHC: 33 g/dL (ref 30.0–36.0)
MCV: 92.2 fL (ref 80.0–100.0)
Monocytes Absolute: 0.8 10*3/uL (ref 0.1–1.0)
Monocytes Relative: 6 %
Neutro Abs: 11.7 10*3/uL — ABNORMAL HIGH (ref 1.7–7.7)
Neutrophils Relative %: 88 %
Platelets: 133 10*3/uL — ABNORMAL LOW (ref 150–400)
RBC: 4.87 MIL/uL (ref 3.87–5.11)
RDW: 16.2 % — ABNORMAL HIGH (ref 11.5–15.5)
WBC: 13.2 10*3/uL — ABNORMAL HIGH (ref 4.0–10.5)
nRBC: 0 % (ref 0.0–0.2)

## 2023-06-27 LAB — BASIC METABOLIC PANEL
Anion gap: 8 (ref 5–15)
BUN: 29 mg/dL — ABNORMAL HIGH (ref 8–23)
CO2: 21 mmol/L — ABNORMAL LOW (ref 22–32)
Calcium: 9.2 mg/dL (ref 8.9–10.3)
Chloride: 107 mmol/L (ref 98–111)
Creatinine, Ser: 0.95 mg/dL (ref 0.44–1.00)
GFR, Estimated: >60 mL/min (ref >60)
Glucose, Bld: 147 mg/dL — ABNORMAL HIGH (ref 70–99)
Potassium: 4.1 mmol/L (ref 3.5–5.1)
Sodium: 136 mmol/L (ref 135–145)

## 2023-06-27 LAB — GLUCOSE, CAPILLARY
Glucose-Capillary: 133 mg/dL — ABNORMAL HIGH (ref 70–99)
Glucose-Capillary: 120 mg/dL — ABNORMAL HIGH (ref 70–99)
Glucose-Capillary: 135 mg/dL — ABNORMAL HIGH (ref 70–99)
Glucose-Capillary: 120 mg/dL — ABNORMAL HIGH (ref 70–99)

## 2023-06-27 MED ORDER — SENNOSIDES-DOCUSATE SODIUM 8.6-50 MG PO TABS
1.0000 | ORAL_TABLET | Freq: Two times a day (BID) | ORAL | Status: DC
  Administered 2023-06-27 – 2023-06-28 (×2): 1 via ORAL
  Filled 2023-06-27 (×2): qty 1

## 2023-06-27 MED ORDER — CEPHALEXIN 500 MG PO CAPS
1000.0000 mg | ORAL_CAPSULE | Freq: Two times a day (BID) | ORAL | Status: DC
  Administered 2023-06-27 – 2023-06-28 (×3): 1000 mg via ORAL
  Filled 2023-06-27 (×3): qty 2

## 2023-06-27 MED ORDER — POLYETHYLENE GLYCOL 3350 17 G PO PACK
17.0000 g | PACK | Freq: Every day | ORAL | Status: DC
  Administered 2023-06-28: 17 g via ORAL
  Filled 2023-06-27: qty 1

## 2023-06-27 NOTE — Telephone Encounter (Signed)
Called [name] [name] to follow up on her foot surgery.  She tells me that everything went well and she should be discharged home tomorrow.  She is concerned about getting from the car into the house and I told her to call 911 and they could send someone to assist her.  I also told her to reach out to me if she had difficulty tomorrow.    [name], EMT-Paramedic [phone] 06/27/2023

## 2023-06-27 NOTE — Discharge Instructions (Addendum)
Netta Cedars, MD [facility]  Please read the following information regarding your care after surgery.  Medications  You only need a prescription for the narcotic pain medicine (ex. oxycodone, Percocet, Norco).  All of the other medicines listed below are available over the counter. ? Aleve 2 pills twice a day for the first 3 days after surgery. ? acetominophen (Tylenol) 650 mg every 4-6 hours as you need for minor to moderate pain ? oxycodone as prescribed for severe pain  ? To help prevent blood clots, resume blood thinners per primary care physician.  You should also get up every hour while you are awake to move around.  Weight Bearing ? Do NOT bear any weight on the operated leg or foot. This means do NOT touch your surgical leg to the ground!  Cast / Splint / Dressing ? If you have a boot, do NOT remove this. Keep your dressings clean and dry.  Don't put anything (coat hanger, pencil, etc) down inside of it.  If it gets wet, call the office immediately to schedule an appointment for a dressing change.  Swelling IMPORTANT: It is normal for you to have swelling where you had surgery. To reduce swelling and pain, keep at least 3 pillows under your leg so that your toes are above your nose and your heel is above the level of your hip.  It may be necessary to keep your foot or leg elevated for several weeks.  This is critical to helping your incisions heal and your pain to feel better.  Follow Up  Fri 06/30/23 at 8:50 AM - [facility] OFFICE ([address], [place])  Call my office at [phone] when you are discharged from the hospital or surgery center if you need to reschedule.  Call my office at [phone] if you develop a fever >101.5 F, nausea, vomiting, bleeding from the surgical site or severe pain.

## 2023-06-27 NOTE — Evaluation (Addendum)
Physical Therapy Evaluation Patient Details Name: [name] [name] MRN: [id number] DOB: [birth year] Today's Date: 06/27/2023  History of Present Illness  77 Yo female S/P R ankle ORIF 06/26/23 after initial ankle fracture with distal fibular and medial malleolus fracture on 06/18/23.PMH:PPM, OPD, cardiac arrest, CABG, CKD, GIB, CHF, gout.  Clinical Impression  Pt admitted with above diagnosis.  Pt currently with functional limitations due to the deficits listed below (see PT Problem List). Pt will benefit from acute skilled PT to increase their independence and safety with mobility to allow discharge.    The patient  is demonstrating  placing weight on the right leg during transfer from bed to [facility] and back with and without RW. Frequent cues  and explanation as to reason for NWB.  Patient also has challenge of 3 STEPS to enter.Discussed option for nonemergency transportation. Reports that she is trying to find help to assist  her up the steps.  Recommend WC and BSC if she does not have one( patient gives varying reports)  Patient does have a CNA live in that has been helping prir to Surgery. Patient does report  bearing weight   on the right foot  prior to surgery since fracture.  Patient declines SNF/rehab.           If plan is discharge home, recommend the following: A little help with walking and/or transfers;A little help with bathing/dressing/bathroom;Help with stairs or ramp for entrance;Assist for transportation;Assistance with cooking/housework   Can travel by Doctor, hospital (measurements PT);Wheelchair cushion (measurements PT);BSC/3in1  Recommendations for Other Services       Functional Status Assessment Patient has had a recent decline in their functional status and demonstrates the ability to make significant improvements in function in a reasonable and predictable amount of time.     Precautions / Restrictions  Precautions Precautions: Fall Required Braces or Orthoses: Splint/Cast Splint/Cast: right CAM boot Restrictions Weight Bearing Restrictions: Yes RLE Weight Bearing: Non weight bearing      Mobility  Bed Mobility Overal bed mobility: Modified Independent                  Transfers Overall transfer level: Needs assistance Equipment used: Rolling walker (2 wheels), None Transfers: Sit to/from Stand, Bed to chair/wheelchair/BSC Sit to Stand: Min assist Stand pivot transfers: Min assist   Squat pivot transfers: Min assist     General transfer comment: patient  squat pivoted to [facility] , frequent cues for NWB, then used RW to stand and pivot to bed, tendency to bear weight on Right leg, frequent cues to NWB    Ambulation/Gait                  Stairs            Wheelchair Mobility     Tilt Bed    Modified Rankin (Stroke Patients Only)       Balance Overall balance assessment: History of Falls, Needs assistance Sitting-balance support: No upper extremity supported, Feet supported Sitting balance-Leahy Scale: Good     Standing balance support: Bilateral upper extremity supported, Reliant on assistive device for balance, During functional activity Standing balance-Leahy Scale: Poor                               Pertinent Vitals/Pain Pain Assessment Pain Assessment: 0-10 Pain Score: 3  Pain Location: right foot Pain Intervention(s): Monitored  during session, Repositioned    Home Living Family/patient expects to be discharged to:: Private residence Living Arrangements: Non-relatives/Friends Available Help at Discharge: Family;Personal care attendant Type of Home: House Home Access: Stairs to enter Entrance Stairs-Rails: None Entrance Stairs-Number of Steps: 3   Home Layout: One level Home Equipment: Crutches;BSC/3in1      Prior Function Prior Level of Function : Needs assist       Physical Assist : Mobility (physical);ADLs  (physical) Mobility (physical): Bed mobility;Transfers;Stairs   Mobility Comments: reports that she ADLs Comments: caregiver assists with ADLs/IADLs     Extremity/Trunk Assessment        Lower Extremity Assessment Lower Extremity Assessment: RLE deficits/detail RLE Deficits / Details: [place] in place    Cervical / Trunk Assessment Cervical / Trunk Assessment: Normal  Communication   Communication Communication: No apparent difficulties  Cognition Arousal: Alert Behavior During Therapy: WFL for tasks assessed/performed Overall Cognitive Status: Difficult to assess Area of Impairment: Safety/judgement                         Safety/Judgement: Decreased awareness of deficits     General Comments: NWB status        General Comments      Exercises     Assessment/Plan    PT Assessment Patient needs continued PT services  PT Problem List Decreased balance;Decreased knowledge of precautions;Decreased mobility;Decreased activity tolerance;Decreased safety awareness       PT Treatment Interventions DME instruction;Therapeutic activities;Gait training;Therapeutic exercise;Patient/family education;Functional mobility training;Wheelchair mobility training    PT Goals (Current goals can be found in the Care Plan section)  Acute Rehab PT Goals Patient Stated Goal: to go home PT Goal Formulation: With patient Time For Goal Achievement: 07/11/23 Potential to Achieve Goals: Good    Frequency Min 1X/week     Co-evaluation               AM-PAC PT "6 Clicks" Mobility  Outcome Measure Help needed turning from your back to your side while in a flat bed without using bedrails?: None Help needed moving from lying on your back to sitting on the side of a flat bed without using bedrails?: None Help needed moving to and from a bed to a chair (including a wheelchair)?: None Help needed standing up from a chair using your arms (e.g., wheelchair or bedside  chair)?: A Lot Help needed to walk in hospital room?: Total Help needed climbing 3-5 steps with a railing? : Total 6 Click Score: 16    End of Session   Activity Tolerance: Patient tolerated treatment well Patient left: in bed;with call bell/phone within reach;with bed alarm set Nurse Communication: Mobility status PT Visit Diagnosis: Unsteadiness on feet (R26.81);History of falling (Z91.81)    Time: 1206-1230 PT Time Calculation (min) (ACUTE ONLY): 24 min   Charges:   PT Evaluation $PT Eval Low Complexity: 1 Low PT Treatments $Therapeutic Activity: 8-22 mins PT General Charges $$ ACUTE PT VISIT: 1 Visit         Blanchard Kelch PT Acute Rehabilitation Services Office [phone] Weekend pager-[phone]   Rada Hay 06/27/2023, 1:25 PM

## 2023-06-27 NOTE — TOC Initial Note (Addendum)
Transition of Care [facility]) - Initial/Assessment Note    Patient Details  Name: [name] [name] MRN: [id number] Date of Birth: 1946-07-28  [facility] CM/SW Contact:    Howell Rucks, RN Phone Number: 06/27/2023, 11:43 AM  Clinical Narrative:  Met with pt at bedside to introduce role of TOC/NCM and review for dc planning, Pt reports she has a PCP and pharmacy in place, reports she has a home nurse visit every 2 weeks for blood glucose check and medication review, reports no current home DME, pt reports she feels safe returning home with support from her family/friends, confirmed transportation available at discharge. PT eval, await recommendation.   -1:26pm Per PT/OT pt reports she has a 24/7 home caregiver, has home BSC.     -1:56pm PT eval completed, recommendation for [facility] PT, Wheelchair, BSC and shower chair/bench. Met with pt at bedside, pt ageeable to [facility] PT, no preference, pt confirmed she has a 24/7 caregiver who assist with ADL's and IADL's, confirmed she has a BSC.       NCM reviewed with pt Medicare does not cover shower bench/chair, would be out of pocket cost, voiced understanding. NCM will arrange [facility] PT and wheelchair to be delivered to bedside prior to dc. TOC will continue to follow.       -3:26pm [employer] rep-Lynette accepted for HH PT,  [employer], rep-Jermaine, to deliver wheelchair and BSC to bedside, added to AVS.    Expected Discharge Plan: Home w Home Health Services Barriers to Discharge: Continued Medical Work up   Patient Goals and CMS Choice Patient states their goals for this hospitalization and ongoing recovery are:: return home          Expected Discharge Plan and Services       Living arrangements for the past 2 months: Single Family Home                                      Prior Living Arrangements/Services Living arrangements for the past 2 months: Single Family Home Lives with:: Self Patient language and need for interpreter  reviewed:: Yes Do you feel safe going back to the place where you live?: Yes      Need for Family Participation in Patient Care: Yes (Comment) Care giver support system in place?: Yes (comment)   Criminal Activity/Legal Involvement Pertinent to Current Situation/Hospitalization: No - Comment as needed  Activities of Daily Living   ADL Screening (condition at time of admission) Independently performs ADLs?: Yes (appropriate for developmental age) Is the patient deaf or have difficulty hearing?: No Does the patient have difficulty seeing, even when wearing glasses/contacts?: No Does the patient have difficulty concentrating, remembering, or making decisions?: No  Permission Sought/Granted                  Emotional Assessment Appearance:: Appears stated age Attitude/Demeanor/Rapport: Gracious Affect (typically observed): Accepting Orientation: : Oriented to Self, Oriented to Place, Oriented to  Time, Oriented to Situation Alcohol / Substance Use: Not Applicable Psych Involvement: No (comment)  Admission diagnosis:  Ankle fracture [S82.899A] Patient Active Problem List   Diagnosis Date Noted   Ankle fracture 06/25/2023   Dysuria 12/29/2022   Cough productive of purulent sputum 12/29/2022   Type 2 diabetes mellitus with hyperlipidemia (HCC) 12/14/2022   Acute cough 12/12/2022   Ceruminosis, bilateral 06/17/2022   Flu vaccine need 06/17/2022   Chronic combined systolic  and diastolic CHF (congestive heart failure) (HCC) 06/17/2022   Encounter for general adult medical examination with abnormal findings 06/16/2022   Chronic anticoagulation 06/06/2022   Mitral regurgitation 06/06/2022   Controlled type 2 diabetes mellitus without complication, without long-term current use of insulin (HCC) 06/06/2022   Pacemaker 02/22/2022   Hypertensive urgency 06/08/2021   Obstructive sleep apnea 09/21/2020   Obesity (BMI 30.0-34.9) 02/10/2020   COPD (chronic obstructive pulmonary disease)  (HCC) 10/26/2019   Thrombocytopenia (HCC) 09/14/2017   Medication noncompliance due to cognitive impairment 09/06/2017   Persistent atrial fibrillation (HCC) 08/04/2017   Cardiomyopathy, ischemic 02/08/2017   CKD (chronic kidney disease), stage III (HCC) 01/30/2017   Coronary artery disease due to lipid rich plaque    Essential hypertension    PCP:  [name], MD Pharmacy:   [facility] [phone] G[place] Otto, [place] - [address] AT [facility] [address] [place] [address] Phone: [phone] Fax: [phone]  [facility] - [place], [place] - [address] [place] [address] Phone: [phone] Fax: [phone]  [facility] [address] [place] [address] Phone: [phone] Fax: [phone]     Social Determinants of Health (SDOH) Social History: SDOH Screenings   Food Insecurity: No Food Insecurity (06/25/2023)  Housing: Low Risk  (06/25/2023)  Transportation Needs: No Transportation Needs (06/25/2023)  Utilities: Not At Risk (06/25/2023)  Depression (PHQ2-9): Low Risk  (12/29/2022)  Financial Resource Strain: Low Risk  (06/07/2018)  Recent Concern: Financial Resource Strain - Medium Risk (05/21/2018)  Stress: Stress Concern Present (02/22/2023)  Tobacco Use: High Risk (06/26/2023)   SDOH Interventions:     Readmission Risk Interventions    06/27/2023   11:39 AM 02/12/2021   10:29 AM 12/09/2020   12:39 PM  Readmission Risk Prevention Plan  Transportation Screening Complete Complete Complete  PCP or Specialist Appt within 5-7 Days Complete    PCP or Specialist Appt within 3-5 Days  Complete Complete  Home Care Screening Complete    Medication Review (RN CM) Complete    HRI or Home Care Consult  Complete Complete  Social Work Consult for Recovery Care Planning/Counseling  Complete Complete  Palliative Care Screening  Not Applicable  Complete  Medication Review Oceanographer)  Complete Complete

## 2023-06-27 NOTE — Progress Notes (Signed)
Subjective: 1 Day Post-Op Procedure(s) (LRB): OPEN REDUCTION INTERNAL FIXATION (ORIF) BIMALLEOLAR ANKLE FRACTURE (Right)  Patient reports pain as appropriately controlled. Denies any new numbness/tingling.   Objective:   VITALS:  Temp:  [97.6 F (36.4 C)-98.3 F (36.8 C)] 97.6 F (36.4 C) (11/12 1250) Pulse Rate:  [58-64] 62 (11/12 1250) Resp:  [18-21] 19 (11/12 0928) BP: (120-156)/(62-79) 123/67 (11/12 1250) SpO2:  [96 %-98 %] 96 % (11/12 1250)  Gen: AAOx3, NAD  Right lower extremity: Well padded short leg splint in place Wiggles toes SILT over toes CR<2s    LABS Recent Labs    06/26/23 0409 06/27/23 0410  HGB 14.4 14.8  WBC 6.6 13.2*  PLT 112* 133*   Recent Labs    06/26/23 0409 06/27/23 0410  NA 140 136  K 3.3* 4.1  CL 112* 107  CO2 19* 21*  BUN 24* 29*  CREATININE 1.08* 0.95  GLUCOSE 117* 147*   No results for input(s): "LABPT", "INR" in the last 72 hours.   Assessment/Plan: 1 Day Post-Op Procedure(s) (LRB): OPEN REDUCTION INTERNAL FIXATION (ORIF) BIMALLEOLAR ANKLE FRACTURE (Right)  -transfer to PACU, then return to RNF -STRICT NWB operative extremity, maximum elevation -maintain dressings until follow up -DVT ppx: resume therapeutic anticoagulation POD2 (or per primary team discretion) -follow up as outpatient within 7-10 days for wound check with possible casting pending resolution of blistering -Keflex rx due to extensive blistering periop -staples out in 2-3 weeks in outpatient office    [name] [name] 06/27/2023, 5:48 PM

## 2023-06-27 NOTE — Progress Notes (Signed)
PROGRESS NOTE  [name] [name] NGE:[id number] DOB: [birth year] DOA: 06/25/2023 PCP: [name] Grandchild, MD  HPI/Recap of past 24 hours: [name] [name] is a 77 y.o. female with medical history significant for hypertension, permanent AF, CAD status post CABG x 2V, chronic combined congestive heart failure with ischemic cardiomyopathy, DM 2, admitted to the hospital for elective right ankle ORIF with Dr. Odis Hollingshead on 11/11. She had a mechanical fall at home on 11/3 and sustained an ankle fracture. She had perioperative workup and clearance by anesthesiology and her cardiologist.  She has been holding her Xarelto since 11/7.  She also developed a nontender nondraining blister on the dorsum of the right foot, but otherwise and is in her usual state of health.  [facility] hospitalist assisted with admission due to complex medical problems.    Today, pt denies any new complaints. Requesting a wheelchair.     Assessment/Plan: Principal Problem:   Ankle fracture  Right ankle fracture s/p ORIF on 11/11 Further management per orthopedics, Dr. Odis Hollingshead DVT PPx, pain management as per orthopedics PT/OT- HH Recommend PO keflex X 5 days due to multiple traumatic blisters noted on R foot Outpt follow up  Leukocytosis Likely reactive Daily cbc  Hypokalemia Replace as needed  Hypertension Stable Continue home hydralazine, Cozaar, Aldactone, IV hydralazine as needed  Diabetes mellitus type 2, controlled Peripheral neuropathy Last A1c 5.6 SSI, Accu-Cheks, hypoglycemic protocol Hold home metformin for now Continue gabapentin   Chronic systolic and diastolic CHF Appears euvolemic Echo showed EF of 25 to 30%, global hypokinesis Continue home meds cozaar, Aldactone, torsemide  History of CAD/hyperlipidemia Currently chest pain-free Continue Lipitor   Permanent atrial fibrillation Rate controlled Xarelto, currently being held preoperatively since 11/7          Estimated body mass index  is 29.02 kg/m as calculated from the following:   Height as of this encounter: 5' 7.5" (1.715 m).   Weight as of this encounter: 85.3 kg.     Code Status: Full  Family Communication: None at bedside  Disposition Plan: Status is: Inpatient Remains inpatient appropriate because: Level of care      Consultants: Orthopedics  Procedures: ORIF  Antimicrobials: None  DVT prophylaxis: SCDs   Objective: Vitals:   06/27/23 0224 06/27/23 0506 06/27/23 0928 06/27/23 1250  BP: (!) 155/79 124/68 120/62 123/67  Pulse:  (!) 58 62 62  Resp: 18 (!) 21 19   Temp: 97.8 F (36.6 C) 97.6 F (36.4 C) 98.3 F (36.8 C) 97.6 F (36.4 C)  TempSrc: Oral Oral Oral Oral  SpO2: 97% 98% 97% 96%  Weight:      Height:        Intake/Output Summary (Last 24 hours) at 06/27/2023 1836 Last data filed at 06/27/2023 1700 Gross per 24 hour  Intake 721.66 ml  Output --  Net 721.66 ml   Filed Weights   06/25/23 1614 06/26/23 0956  Weight: 85.3 kg 85.3 kg    Exam: General: NAD  Cardiovascular: S1, S2 present Respiratory: Diminished breath sounds bilaterally Abdomen: Soft, nontender, nondistended, bowel sounds present Musculoskeletal: No bilateral pedal edema noted, noted large nondraining blister on the dorsum of the right foot, noted Ace wrap around RLE Skin: As noted Psychiatry: Normal mood     Data Reviewed: CBC: Recent Labs  Lab 06/26/23 0409 06/27/23 0410  WBC 6.6 13.2*  NEUTROABS  --  11.7*  HGB 14.4 14.8  HCT 44.8 44.9  MCV 94.7 92.2  PLT 112* 133*   Basic  Metabolic Panel: Recent Labs  Lab 06/26/23 0409 06/27/23 0410  NA 140 136  K 3.3* 4.1  CL 112* 107  CO2 19* 21*  GLUCOSE 117* 147*  BUN 24* 29*  CREATININE 1.08* 0.95  CALCIUM 9.1 9.2  MG 2.0  --    GFR: Estimated Creatinine Clearance: 55.3 mL/min (by C-G formula based on SCr of 0.95 mg/dL). Liver Function Tests: No results for input(s): "AST", "ALT", "ALKPHOS", "BILITOT", "PROT", "ALBUMIN" in the  last 168 hours. No results for input(s): "LIPASE", "AMYLASE" in the last 168 hours. No results for input(s): "AMMONIA" in the last 168 hours. Coagulation Profile: No results for input(s): "INR", "PROTIME" in the last 168 hours. Cardiac Enzymes: No results for input(s): "CKTOTAL", "CKMB", "CKMBINDEX", "TROPONINI" in the last 168 hours. BNP (last 3 results) Recent Labs    12/12/22 1656  PROBNP 764.0*   HbA1C: Recent Labs    06/26/23 0409  HGBA1C 5.6   CBG: Recent Labs  Lab 06/26/23 1722 06/26/23 2147 06/27/23 0749 06/27/23 1137 06/27/23 1613  GLUCAP 212* 249* 133* 120* 135*   Lipid Profile: No results for input(s): "CHOL", "HDL", "LDLCALC", "TRIG", "CHOLHDL", "LDLDIRECT" in the last 72 hours. Thyroid Function Tests: No results for input(s): "TSH", "T4TOTAL", "FREET4", "T3FREE", "THYROIDAB" in the last 72 hours. Anemia Panel: No results for input(s): "VITAMINB12", "FOLATE", "FERRITIN", "TIBC", "IRON", "RETICCTPCT" in the last 72 hours. Urine analysis:    Component Value Date/Time   COLORURINE AMBER (A) 06/06/2022 0830   APPEARANCEUR CLEAR 06/06/2022 0830   LABSPEC >1.030 (H) 06/06/2022 0830   PHURINE 5.5 06/06/2022 0830   GLUCOSEU NEGATIVE 06/06/2022 0830   GLUCOSEU NEGATIVE 03/16/2022 1343   HGBUR TRACE (A) 06/06/2022 0830   BILIRUBINUR negative 12/29/2022 1620   KETONESUR NEGATIVE 06/06/2022 0830   PROTEINUR Positive (A) 12/29/2022 1620   PROTEINUR 100 (A) 06/06/2022 0830   UROBILINOGEN 0.2 12/29/2022 1620   UROBILINOGEN 1.0 03/16/2022 1343   NITRITE negative 12/29/2022 1620   NITRITE NEGATIVE 06/06/2022 0830   LEUKOCYTESUR Negative 12/29/2022 1620   LEUKOCYTESUR NEGATIVE 06/06/2022 0830   Sepsis Labs: @LABRCNTIP (procalcitonin:4,lacticidven:4)  )No results found for this or any previous visit (from the past 240 hour(s)).    Studies: No results found.  Scheduled Meds:  allopurinol  100 mg Oral Daily   atorvastatin  10 mg Oral Daily   cephALEXin  1,000  mg Oral Q12H   enoxaparin (LOVENOX) injection  40 mg Subcutaneous Q24H   gabapentin  100 mg Oral QHS   hydrALAZINE  50 mg Oral Q8H   insulin aspart  0-15 Units Subcutaneous TID WC   insulin aspart  0-5 Units Subcutaneous QHS   losartan  50 mg Oral Daily   pantoprazole  40 mg Oral Daily   polyethylene glycol  17 g Oral Daily   senna-docusate  1 tablet Oral BID   spironolactone  25 mg Oral Daily   torsemide  20 mg Oral Daily    Continuous Infusions:     LOS: 2 days     Briant Cedar, MD [facility]  If 7PM-7AM, please contact night-coverage [email or web address] 06/27/2023, 6:36 PM

## 2023-06-27 NOTE — Evaluation (Signed)
Occupational Therapy Evaluation Patient Details Name: [name] [name] MRN: [id number] DOB: [birth year] Today's Date: 06/27/2023   History of Present Illness 77 Yo female S/P R ankle ORIF 06/26/23 after initial ankle fracture with distal fibular and medial malleolus fracture on 06/18/23.PMH:PPM, OPD, cardiac arrest, CABG, CKD, GIB, CHF, gout.   Clinical Impression   Prior to hospital admission, pt required assist from caregiver with all ADL/IADLs. Pt lives with caregiver in 1 level home with 3 STE + threshold. Discrepancies in home setup noted as pt provides different information to OT vs what given on PT eval. Pt tells this writer she was pivoting to [facility] with caregiver for toileting needs, but primarily in bed since initial fracture. Pt is planning on having "big, strong men lift me up the stairs" at discharge and refusing SNF.   Pt currently functioning at min assist level for transfers/ADLs, is noncompliant with NWB status despite multiple attempts for cuing and education. Pt would benefit from skilled OT services to address noted impairments and functional limitations (see below for any additional details) in order to maximize safety and independence while minimizing falls risk and caregiver burden. Pt will need a BSC at home upon discharge (initially says the only DME owned are [employer], then states caregiver purchased [facility]). OT will continue to follow for functional gains while in hospital.     If plan is discharge home, recommend the following: A little help with bathing/dressing/bathroom;A lot of help with walking and/or transfers;Assistance with cooking/housework;Direct supervision/assist for medications management;Direct supervision/assist for financial management;Assist for transportation;Help with stairs or ramp for entrance    Functional Status Assessment  Patient has had a recent decline in their functional status and demonstrates the ability to make significant improvements in function in  a reasonable and predictable amount of time.  Equipment Recommendations  BSC/3in1;Tub/shower bench       Precautions / Restrictions Precautions Precautions: Fall Required Braces or Orthoses: Splint/Cast Splint/Cast: right CAM boot Restrictions Weight Bearing Restrictions: Yes RLE Weight Bearing: Non weight bearing      Mobility Bed Mobility Overal bed mobility: Modified Independent                  Transfers Overall transfer level: Needs assistance Equipment used: None Transfers: Sit to/from Stand, Bed to chair/wheelchair/BSC Sit to Stand: Min assist Stand pivot transfers: Min assist Squat pivot transfers: Min assist       General transfer comment: squat pivot to BSC, frequent cues, OT places foot under boot to monitor WB status with pt insisting "I have to use it to stand" despite acknowledging NWB status verbally several minutes prior. Tends to bear weight on RLE despite frequent cues and instructions      Balance Overall balance assessment: History of Falls, Needs assistance Sitting-balance support: No upper extremity supported, Feet supported Sitting balance-Leahy Scale: Good       Standing balance-Leahy Scale: Poor                             ADL either performed or assessed with clinical judgement   ADL Overall ADL's : Needs assistance/impaired Eating/Feeding: Independent;Sitting               Upper Body Dressing : Minimal assistance;Sitting   Lower Body Dressing: Minimal assistance;Sitting/lateral leans   Toilet Transfer: Minimal assistance;Stand-pivot;BSC/3in1 Toilet Transfer Details (indicate cue type and reason): max vcs for NWB status and IV awareness, pt ignoring directions and OT attempts at educating  Functional mobility during ADLs: Minimal assistance General ADL Comments: Noncompliance with NWB status. Pt has 24/7 assist from caregiver at home. Has been using BSC to stand pivot with caregiver.     Vision  Ability to See in Adequate Light: 0 Adequate              Pertinent Vitals/Pain Pain Assessment Pain Assessment: 0-10 Pain Score: 3  Pain Location: right foot Pain Descriptors / Indicators: Discomfort, Dull, Grimacing, Guarding Pain Intervention(s): Limited activity within patient's tolerance, Monitored during session     Extremity/Trunk Assessment Upper Extremity Assessment Upper Extremity Assessment: Overall WFL for tasks assessed   Lower Extremity Assessment Lower Extremity Assessment: RLE deficits/detail RLE Deficits / Details: [place] in place   Cervical / Trunk Assessment Cervical / Trunk Assessment: Normal   Communication Communication Communication: No apparent difficulties   Cognition Arousal: Alert Behavior During Therapy: WFL for tasks assessed/performed Overall Cognitive Status: Difficult to assess Area of Impairment: Safety/judgement                         Safety/Judgement: Decreased awareness of safety, Decreased awareness of deficits     General Comments: NWB status, pt states multiple times "quit telling me what to do"                Home Living Family/patient expects to be discharged to:: Private residence Living Arrangements: Non-relatives/Friends Available Help at Discharge: Family;Personal care attendant Type of Home: House Home Access: Stairs to enter Secretary/administrator of Steps: 3 Entrance Stairs-Rails: None Home Layout: One level     Bathroom Shower/Tub: Chief Strategy Officer: Standard     Home Equipment: Crutches;BSC/3in1          Prior Functioning/Environment Prior Level of Function : Needs assist       Physical Assist : Mobility (physical);ADLs (physical) Mobility (physical): Bed mobility;Transfers;Stairs   Mobility Comments: reports that she ADLs Comments: caregiver assists with ADLs/IADLs        OT Problem List: Decreased strength;Decreased range of motion;Impaired balance (sitting  and/or standing);Decreased safety awareness;Decreased knowledge of precautions;Decreased knowledge of use of DME or AE;Pain      OT Treatment/Interventions: Self-care/ADL training;Therapeutic exercise;DME and/or AE instruction;Therapeutic activities;Visual/perceptual remediation/compensation;Balance training;Patient/family education    OT Goals(Current goals can be found in the care plan section) Acute Rehab OT Goals OT Goal Formulation: With patient Time For Goal Achievement: 07/11/23 Potential to Achieve Goals: Fair  OT Frequency: Min 1X/week    Co-evaluation              AM-PAC OT "6 Clicks" Daily Activity     Outcome Measure Help from another person eating meals?: None Help from another person taking care of personal grooming?: None Help from another person toileting, which includes using toliet, bedpan, or urinal?: A Little Help from another person bathing (including washing, rinsing, drying)?: A Little Help from another person to put on and taking off regular upper body clothing?: A Little Help from another person to put on and taking off regular lower body clothing?: A Little 6 Click Score: 20   End of Session Equipment Utilized During Treatment: Gait belt Nurse Communication: Weight bearing status;Mobility status  Activity Tolerance: Patient tolerated treatment well Patient left: in bed;with call bell/phone within reach;with bed alarm set;Other (comment) (CAM boot on)  OT Visit Diagnosis: Repeated falls (R29.6);Unsteadiness on feet (R26.81)                Time: 3474-2595 OT  Time Calculation (min): 24 min Charges:  OT General Charges $OT Visit: 1 Visit OT Evaluation $OT Eval Moderate Complexity: 1 Mod OT Treatments $Self Care/Home Management : 8-22 mins Brittony Billick L. Zacariah Belue, OTR/L  06/27/23, 1:53 PM

## 2023-06-28 DIAGNOSIS — S82891A Other fracture of right lower leg, initial encounter for closed fracture: Secondary | ICD-10-CM | POA: Diagnosis not present

## 2023-06-28 LAB — GLUCOSE, CAPILLARY: Glucose-Capillary: 100 mg/dL — ABNORMAL HIGH (ref 70–99)

## 2023-06-28 LAB — BASIC METABOLIC PANEL
Anion gap: 8 (ref 5–15)
BUN: 35 mg/dL — ABNORMAL HIGH (ref 8–23)
CO2: 22 mmol/L (ref 22–32)
Calcium: 8.9 mg/dL (ref 8.9–10.3)
Chloride: 108 mmol/L (ref 98–111)
Creatinine, Ser: 1.15 mg/dL — ABNORMAL HIGH (ref 0.44–1.00)
GFR, Estimated: 49 mL/min — ABNORMAL LOW (ref >60)
Glucose, Bld: 107 mg/dL — ABNORMAL HIGH (ref 70–99)
Potassium: 3.3 mmol/L — ABNORMAL LOW (ref 3.5–5.1)
Sodium: 138 mmol/L (ref 135–145)

## 2023-06-28 LAB — CBC WITH DIFFERENTIAL/PLATELET
Abs Immature Granulocytes: 0.07 10*3/uL (ref 0.00–0.07)
Basophils Absolute: 0 10*3/uL (ref 0.0–0.1)
Basophils Relative: 0 %
Eosinophils Absolute: 0 10*3/uL (ref 0.0–0.5)
Eosinophils Relative: 0 %
HCT: 40.2 % (ref 36.0–46.0)
Hemoglobin: 13.7 g/dL (ref 12.0–15.0)
Immature Granulocytes: 1 %
Lymphocytes Relative: 15 %
Lymphs Abs: 1.6 10*3/uL (ref 0.7–4.0)
MCH: 31.3 pg (ref 26.0–34.0)
MCHC: 34.1 g/dL (ref 30.0–36.0)
MCV: 91.8 fL (ref 80.0–100.0)
Monocytes Absolute: 1 10*3/uL (ref 0.1–1.0)
Monocytes Relative: 9 %
Neutro Abs: 8.4 10*3/uL — ABNORMAL HIGH (ref 1.7–7.7)
Neutrophils Relative %: 75 %
Platelets: 101 10*3/uL — ABNORMAL LOW (ref 150–400)
RBC: 4.38 MIL/uL (ref 3.87–5.11)
RDW: 16.2 % — ABNORMAL HIGH (ref 11.5–15.5)
WBC: 11.1 10*3/uL — ABNORMAL HIGH (ref 4.0–10.5)
nRBC: 0 % (ref 0.0–0.2)

## 2023-06-28 MED ORDER — CEPHALEXIN 500 MG PO CAPS: 1000.0000 mg | ORAL_CAPSULE | Freq: Two times a day (BID) | ORAL | 0 refills | Status: AC

## 2023-06-28 MED ORDER — OXYCODONE HCL 5 MG PO TABS
5.0000 mg | ORAL_TABLET | Freq: Once | ORAL | Status: AC | PRN
  Administered 2023-06-28: 5 mg via ORAL
  Filled 2023-06-28: qty 1

## 2023-06-28 MED ORDER — POTASSIUM CHLORIDE CRYS ER 20 MEQ PO TBCR
40.0000 meq | EXTENDED_RELEASE_TABLET | Freq: Once | ORAL | Status: AC
  Administered 2023-06-28: 40 meq via ORAL
  Filled 2023-06-28: qty 2

## 2023-06-28 MED ORDER — HYDROCODONE-ACETAMINOPHEN 5-325 MG PO TABS: 1.0000 | ORAL_TABLET | ORAL | 0 refills | Status: DC | PRN

## 2023-06-28 NOTE — Plan of Care (Signed)
  Problem: Pain Management: Goal: General experience of comfort will improve Outcome: Progressing   Problem: Safety: Goal: Ability to remain free from injury will improve Outcome: Progressing   Problem: Education: Goal: Ability to describe self-care measures that may prevent or decrease complications (Diabetes Survival Skills Education) will improve Outcome: Progressing   Problem: Skin Integrity: Goal: Risk for impaired skin integrity will decrease Outcome: Progressing

## 2023-06-28 NOTE — Discharge Summary (Signed)
Physician Discharge Summary  FABIA [name] [name]:[id number] DOB: 30-May-1946 DOA: 06/25/2023  PCP: [name], MD  Admit date: 06/25/2023 Discharge date: 06/28/2023  Admitted From: Home  Discharge disposition: Home with home health  Recommendations for Outpatient Follow-Up:   Follow up with your primary care provider in one week.  Check CBC, BMP, magnesium in the next visit Follow-up with orthopedics in 1 week. Nonweightbearing to the operative extremity.  Discharge Diagnosis:   Principal Problem:   Ankle fracture   Discharge Condition: Improved.  Diet recommendation: Low sodium, heart healthy.  Carbohydrate-modified.    Wound care: Keep dressing intact.  Code status: Full.  History of Present Illness:   [name] [name] is a 77 y.o. female with medical history significant for hypertension, permanent AF, CAD status post CABG x 2V, chronic combined congestive heart failure with ischemic cardiomyopathy, DM 2, admitted to the hospital for elective right ankle ORIF with Dr. Odis Hollingshead on 11/11. She had a mechanical fall at home on 11/3 and sustained an ankle fracture. She had perioperative workup and clearance by anesthesiology and her cardiologist.  She has been holding her Xarelto since 11/7.  She also developed a nontender nondraining blister on the dorsum of the right foot, but otherwise and is in her usual state of health.    Hospital Course:   Following conditions were addressed during hospitalization as listed below,  Right ankle fracture s/p ORIF on 11/11 PT OT recommended home health on discharge.  Recommend PO keflex X 5 days due to multiple traumatic blisters noted on R foot.  Patient will be nonweightbearing on the operative extremity and will be on wheelchair.   Leukocytosis Likely reactive   Hypokalemia Mild.  Replenish.   Essential hypertension Continue hydralazine, Cozaar, Aldactone, IV hydralazine as needed   Diabetes mellitus type 2,  controlled Peripheral neuropathy Last hemoglobin A1c 5.6.  Continue gabapentin.  Continue metformin on discharge.    Chronic systolic and diastolic CHF Appears euvolemic.  2D Echo showed EF of 25 to 30%, global hypokinesis. Continue home meds cozaar, Aldactone, torsemide   History of CAD/hyperlipidemia Currently chest pain-free. Continue Lipitor   Permanent atrial fibrillation Rate controlled.  Continue Xarelto    Disposition.  At this time, patient is stable for disposition home with home health.  Medical Consultants:   None.  Procedures:    Right ankle fracture ORIF on 11/11  Subjective:   Today, patient was seen and examined at bedside.  Denies any nausea, vomiting, fever, chills or rigor.  Complains of mild foot pain.  Discharge Exam:   Vitals:   06/27/23 1938 06/28/23 0336  BP: 135/67 (!) 156/100  Pulse: 63 80  Resp: 16   Temp: 97.8 F (36.6 C)   SpO2: 99% 97%   Vitals:   06/27/23 0928 06/27/23 1250 06/27/23 1938 06/28/23 0336  BP: 120/62 123/67 135/67 (!) 156/100  Pulse: 62 62 63 80  Resp: 19  16   Temp: 98.3 F (36.8 C) 97.6 F (36.4 C) 97.8 F (36.6 C)   TempSrc: Oral Oral Oral   SpO2: 97% 96% 99% 97%  Weight:      Height:       Body mass index is 29.02 kg/m.   General: Alert awake, not in obvious distress HENT: pupils equally reacting to light,  No scleral pallor or icterus noted. Oral mucosa is moist.  Chest:  Clear breath sounds.  No crackles or wheezes.  CVS: S1 &S2 heard. No murmur.  Regular rate and  rhythm. Abdomen: Soft, nontender, nondistended.  Bowel sounds are heard.   Extremities: No cyanosis, clubbing or edema.  Ace wrap on the right lower extremity Psych: Alert, awake and oriented, normal mood CNS:  No cranial nerve deficits.   Skin: Warm and dry.  No rashes noted.  The results of significant diagnostics from this hospitalization (including imaging, microbiology, ancillary and laboratory) are listed below for reference.      Diagnostic Studies:   DG Ankle 2 Views Right  Result Date: 06/26/2023 CLINICAL DATA:  Elective surgery. EXAM: RIGHT ANKLE - 2 VIEW COMPARISON:  Preoperative imaging. FINDINGS: Lateral plate and screw fixation with 2 syndesmotic screws traverse distal fibular fracture. The medial malleolar fracture is in improved alignment. Posterior tibial tubercle fracture not well demonstrated on provided views. Fluoroscopy time 1.11 seconds. Dose 1.66 mGy. IMPRESSION: Procedural fluoroscopy for ORIF ankle fracture. Electronically Signed   By: Narda Rutherford M.D.   On: 06/26/2023 15:48   DG C-Arm 1-60 Min-No Report  Result Date: 06/26/2023 Fluoroscopy was utilized by the requesting physician.  No radiographic interpretation.   DG C-Arm 1-60 Min-No Report  Result Date: 06/26/2023 Fluoroscopy was utilized by the requesting physician.  No radiographic interpretation.     Labs:   Basic Metabolic Panel: Recent Labs  Lab 06/26/23 0409 06/27/23 0410 06/28/23 0403  NA 140 136 138  K 3.3* 4.1 3.3*  CL 112* 107 108  CO2 19* 21* 22  GLUCOSE 117* 147* 107*  BUN 24* 29* 35*  CREATININE 1.08* 0.95 1.15*  CALCIUM 9.1 9.2 8.9  MG 2.0  --   --    GFR Estimated Creatinine Clearance: 45.7 mL/min (A) (by C-G formula based on SCr of 1.15 mg/dL (H)). Liver Function Tests: No results for input(s): "AST", "ALT", "ALKPHOS", "BILITOT", "PROT", "ALBUMIN" in the last 168 hours. No results for input(s): "LIPASE", "AMYLASE" in the last 168 hours. No results for input(s): "AMMONIA" in the last 168 hours. Coagulation profile No results for input(s): "INR", "PROTIME" in the last 168 hours.  CBC: Recent Labs  Lab 06/26/23 0409 06/27/23 0410 06/28/23 0403  WBC 6.6 13.2* 11.1*  NEUTROABS  --  11.7* 8.4*  HGB 14.4 14.8 13.7  HCT 44.8 44.9 40.2  MCV 94.7 92.2 91.8  PLT 112* 133* 101*   Cardiac Enzymes: No results for input(s): "CKTOTAL", "CKMB", "CKMBINDEX", "TROPONINI" in the last 168  hours. BNP: Invalid input(s): "POCBNP" CBG: Recent Labs  Lab 06/27/23 0749 06/27/23 1137 06/27/23 1613 06/27/23 2102 06/28/23 0751  GLUCAP 133* 120* 135* 120* 100*   D-Dimer No results for input(s): "DDIMER" in the last 72 hours. Hgb A1c Recent Labs    06/26/23 0409  HGBA1C 5.6   Lipid Profile No results for input(s): "CHOL", "HDL", "LDLCALC", "TRIG", "CHOLHDL", "LDLDIRECT" in the last 72 hours. Thyroid function studies No results for input(s): "TSH", "T4TOTAL", "T3FREE", "THYROIDAB" in the last 72 hours.  Invalid input(s): "FREET3" Anemia work up No results for input(s): "VITAMINB12", "FOLATE", "FERRITIN", "TIBC", "IRON", "RETICCTPCT" in the last 72 hours. Microbiology No results found for this or any previous visit (from the past 240 hour(s)).   Discharge Instructions:   Discharge Instructions     Ambulatory Referral for Lung Cancer Scre   Complete by: As directed    Call MD for:  redness, tenderness, or signs of infection (pain, swelling, redness, odor or green/yellow discharge around incision site)   Complete by: As directed    Call MD for:  severe uncontrolled pain   Complete by: As directed  Call MD for:  temperature >100.4   Complete by: As directed    Diet - low sodium heart healthy   Complete by: As directed    Diet Carb Modified   Complete by: As directed    Discharge instructions   Complete by: As directed    STRICT nonweightbearing to the operative extremity, maximum elevation. Maintain dressings until follow up. Follow up as outpatient with orthopedic within 7-10 days for wound check with possible casting pending resolution of blistering. Staples out in 2-3 weeks in outpatient office. Seek medical attention for worsening symptoms.      Allergies as of 06/28/2023       Reactions   Bee Venom Anaphylaxis   Ivp Dye [iodinated Contrast Media] Anaphylaxis   Ace Inhibitors Other (See Comments)   angioedema   Atenolol Other (See Comments)   severe  headaches   Codeine Nausea And Vomiting   Entresto [sacubitril-valsartan] Other (See Comments)   Chest pain    Isosorbide Other (See Comments)   Severe headaches   Shrimp [shellfish Allergy] Swelling   Simvastatin Other (See Comments)   not sure   Jardiance [empagliflozin] Other (See Comments)   Caused Boils   Penicillin G Rash        Medication List     STOP taking these medications    metoprolol succinate 100 MG 24 hr tablet Commonly known as: TOPROL-XL   oxyCODONE-acetaminophen 5-325 MG tablet Commonly known as: PERCOCET/ROXICET       TAKE these medications    acetaminophen 500 MG tablet Commonly known as: TYLENOL Take 500 mg by mouth as needed for mild pain (pain score 1-3) or moderate pain (pain score 4-6).   Alcohol Pads 70 % Pads 1 Act by Does not apply route 2 (two) times daily.   allopurinol 100 MG tablet Commonly known as: ZYLOPRIM Take 1 tablet (100 mg total) by mouth daily.   atorvastatin 10 MG tablet Commonly known as: LIPITOR TAKE 1 TABLET BY MOUTH EVERYDAY AT BEDTIME   cephALEXin 500 MG capsule Commonly known as: KEFLEX Take 2 capsules (1,000 mg total) by mouth every 12 (twelve) hours for 4 days.   CLEAR EYES OP Place 2 drops into both eyes daily as needed (irritation).   gabapentin 100 MG capsule Commonly known as: NEURONTIN TAKE 1 CAPSULE BY MOUTH EVERYDAY AT BEDTIME What changed: See the new instructions.   hydrALAZINE 25 MG tablet Commonly known as: APRESOLINE TAKE 2 TABLETS (50 MG TOTAL) BY MOUTH 3 (THREE) TIMES DAILY.   HYDROcodone-acetaminophen 5-325 MG tablet Commonly known as: NORCO/VICODIN Take 1 tablet by mouth every 4 (four) hours as needed for moderate pain (pain score 4-6) or severe pain (pain score 7-10). What changed: when to take this   losartan 50 MG tablet Commonly known as: COZAAR Take 50 mg by mouth daily. What changed: Another medication with the same name was removed. Continue taking this medication, and  follow the directions you see here.   metFORMIN 500 MG tablet Commonly known as: GLUCOPHAGE TAKE 1 TABLET BY MOUTH EVERY DAY WITH BREAKFAST   ONE TOUCH ULTRA 2 w/Device Kit 1 Act by Does not apply route 2 (two) times daily.   OneTouch Ultra test strip Generic drug: glucose blood 1 each by Other route 2 (two) times daily. as directed   pantoprazole 40 MG tablet Commonly known as: PROTONIX TAKE 1 TABLET BY MOUTH EVERY DAY   Rivaroxaban 15 MG Tabs tablet Commonly known as: XARELTO Take 1 tablet (15 mg total)  by mouth daily with supper.   spironolactone 25 MG tablet Commonly known as: ALDACTONE Take 1 tablet (25 mg total) by mouth daily.   torsemide 20 MG tablet Commonly known as: DEMADEX Take 1 tablet (20 mg total) by mouth daily.   Trelegy Ellipta 100-62.5-25 MCG/ACT Aepb Generic drug: Fluticasone-Umeclidin-Vilant Inhale ONE PUFF into THE lungs daily What changed: See the new instructions.   VICKS VAPOR INHALER IN Inhale into the lungs as needed.               Durable Medical Equipment  (From admission, onward)           Start     Ordered   06/27/23 1448  For home use only DME standard manual wheelchair with seat cushion  Once       Comments: Patient suffers from debility which impairs their ability to perform daily activities like walking in the home.  A walker will not resolve issue with performing activities of daily living. A wheelchair will allow patient to safely perform daily activities. Patient can safely propel the wheelchair in the home or has a caregiver who can provide assistance. Length of need 6 months . Accessories: elevating leg rests (ELRs), wheel locks, extensions and anti-tippers.   06/27/23 1449   06/27/23 1447  For home use only DME 3 n 1  Once        06/27/23 1446            Follow-up Information     [facility] Follow up.   Why: Quarry manager with seat cushion Contact information: [address] [place], [place] [address]  Phone: [phone]        [place] [employer] [facility] [place] Follow up.   Why: [facility]   [address] [place], [place] [address] [phone]  Home Health Physical Therapy        [name], MD Follow up in 1 week(s).   Specialty: Internal Medicine Contact information: [address] [place] [address] [phone]         Netta Cedars, MD Follow up in 1 week(s).   Specialty: Orthopedic Surgery Contact information: [address] [place] [address] [phone]                  Time coordinating discharge: 39 minutes  Signed:  Anuhea Gassner  [facility] 06/28/2023, 9:46 AM

## 2023-06-28 NOTE — Progress Notes (Signed)
Physical Therapy Treatment Patient Details Name: [name] [name] MRN: [id number] DOB: [birth year] Today's Date: 06/28/2023   History of Present Illness 77 Yo female S/P R ankle ORIF 06/26/23 after initial ankle fracture with distal fibular and medial malleolus fracture on 06/18/23.PMH:PPM, OPD, cardiac arrest, CABG, CKD, GIB, CHF, gout.    PT Comments   Pt admitted with above diagnosis and Pt now POD 1 s/p R ankle ORIF.  Pt currently with functional limitations due to the deficits listed below (see PT Problem List). Pt seated EOB when PT arrived. Pt visibly upset indicating that "her people" were down stairs waiting to pick her up".  Pt requested A for dressing tasks while seated EOB. PT noted that R CAM boot not in proper position and velcro straps across the dorsal surface of foot not placed. PT able to assist pt to seat R heel further back in CAM boot and provided education and instruction on importance of proper donning and wear schedule for CAM boot. PT had pt perform return demonstration for 2 most proximal velcro straps with pt stating someone else would be helping her and she goes back this week to ortho. Pt indicated that she did not want it tight and ed provided on rational for proper fitting and support of CAM boot.  Pt reported then need to void bladder and BSC to L of pt and pt performed squat pivot like transfer to [facility] and during the course of the transfer pt exhibited blatant disregard for R LE NWB status regardless of cues and then pt transferred from [facility] to wc pt elected to push through R LE. Pt left seated in wc with nurse tech present. Pt will benefit from acute skilled PT to increase their independence and safety with mobility to allow discharge.      If plan is discharge home, recommend the following: A little help with walking and/or transfers;A little help with bathing/dressing/bathroom;Help with stairs or ramp for entrance;Assist for transportation;Assistance with cooking/housework    Can travel by Doctor, hospital (measurements PT);Wheelchair cushion (measurements PT);BSC/3in1    Recommendations for Other Services       Precautions / Restrictions Precautions Precautions: Fall Required Braces or Orthoses: Splint/Cast Splint/Cast: right CAM boot Restrictions Weight Bearing Restrictions: Yes RLE Weight Bearing: Non weight bearing     Mobility  Bed Mobility Overal bed mobility: Modified Independent             General bed mobility comments: pt seated EOB when PT arrived    Transfers Overall transfer level: Needs assistance Equipment used: None Transfers: Bed to chair/wheelchair/BSC       Squat pivot transfers: Supervision     General transfer comment: squat pivot to [facility] and then to wc. pt declined use of RW, unable to observe or recall R LE NWB status and electing to push though R LE to perform transfer tasks with pt indicated that she was not putting any weight though the leg. pt will require ongoing education and reinforcement for R LE NWB status with ed provided on constant R LE WB impeeding the healing process.    Ambulation/Gait               General Gait Details: NT due to pt inabiltiy to observe, maintain or recall R LE NWB stauts, wc in personal room   Stairs             Wheelchair Mobility  Tilt Bed    Modified Rankin (Stroke Patients Only)       Balance Overall balance assessment: History of Falls, Needs assistance Sitting-balance support: No upper extremity supported, Feet supported Sitting balance-Leahy Scale: Good                                      Cognition Arousal: Alert Behavior During Therapy: WFL for tasks assessed/performed Overall Cognitive Status: Difficult to assess Area of Impairment: Safety/judgement                         Safety/Judgement: Decreased awareness of safety, Decreased awareness of deficits      General Comments: pt exhibited blatent disregard for pt ed and cues on R LE NWB status        Exercises      General Comments        Pertinent Vitals/Pain Pain Assessment Pain Assessment: 0-10 Pain Score: 5  Pain Location: right foot Pain Descriptors / Indicators: Discomfort, Dull, Grimacing, Guarding Pain Intervention(s): Limited activity within patient's tolerance, Monitored during session, Repositioned    Home Living                          Prior Function            PT Goals (current goals can now be found in the care plan section) Acute Rehab PT Goals Patient Stated Goal: to go home PT Goal Formulation: With patient Time For Goal Achievement: 07/11/23 Potential to Achieve Goals: Good Progress towards PT goals: Progressing toward goals    Frequency    Min 1X/week      PT Plan      Co-evaluation              AM-PAC PT "6 Clicks" Mobility   Outcome Measure  Help needed turning from your back to your side while in a flat bed without using bedrails?: None Help needed moving from lying on your back to sitting on the side of a flat bed without using bedrails?: None Help needed moving to and from a bed to a chair (including a wheelchair)?: None Help needed standing up from a chair using your arms (e.g., wheelchair or bedside chair)?: A Lot Help needed to walk in hospital room?: Total Help needed climbing 3-5 steps with a railing? : Total 6 Click Score: 16    End of Session Equipment Utilized During Treatment:  (R CAM boot) Activity Tolerance: Patient tolerated treatment well Patient left: Other (comment) (in wc with Nurse tech in room) Nurse Communication: Mobility status PT Visit Diagnosis: Unsteadiness on feet (R26.81);History of falling (Z91.81)     Time: 1610-9604 PT Time Calculation (min) (ACUTE ONLY): 19 min  Charges:    $Therapeutic Activity: 8-22 mins PT General Charges $$ ACUTE PT VISIT: 1 Visit                      [name] [name], PT Acute Rehab    [name] [name] 06/28/2023, 1:19 PM

## 2023-06-30 ENCOUNTER — Telehealth: Payer: Self-pay | Admitting: *Deleted

## 2023-06-30 DIAGNOSIS — S82841D Displaced bimalleolar fracture of right lower leg, subsequent encounter for closed fracture with routine healing: Secondary | ICD-10-CM | POA: Diagnosis not present

## 2023-06-30 NOTE — Transitions of Care (Post Inpatient/ED Visit) (Signed)
   06/30/2023  Name: [name] [name] MRN: [id number] DOB: 10/28/1945  Today's TOC FU Call Status: Today's TOC FU Call Status:: Unsuccessful Call (1st Attempt) Unsuccessful Call (1st Attempt) Date: 06/30/23  Attempted to reach the patient regarding the most recent Inpatient visit; left HIPAA compliant voice message requesting call back  Follow Up Plan: Additional outreach attempts will be made to reach the patient to complete the Transitions of Care (Post Inpatient visit) call.   Caryl Pina, RN, BSN, Media planner  Transitions of Care  [facility] - [facility] [phone]: direct office

## 2023-07-03 ENCOUNTER — Telehealth: Payer: Self-pay | Admitting: *Deleted

## 2023-07-03 NOTE — Transitions of Care (Post Inpatient/ED Visit) (Signed)
07/03/2023  Name: [name] [name] MRN: [id number] DOB: February 13, 1946  Today's TOC FU Call Status: Today's TOC FU Call Status:: Successful TOC FU Call Completed TOC FU Call Complete Date: 07/03/23 Patient's Name and Date of Birth confirmed.  Transition Care Management Follow-up Telephone Call Date of Discharge: 06/28/23 Discharge Facility: [facility]) Type of Discharge: Inpatient Admission Primary Inpatient Discharge Diagnosis:: (R) ankle fracture secondary to mechanical fall with surgical ORIF How have you been since you were released from the hospital?: Better ("I am fine, keeping weight off my leg like they told me to.  I have a 24-7 caregiver and a nurse who comes out to see me frequently.  I don't need any more help, don't need to see Dr. Yetta Barre- I have an appt with my surgeon and heart doctor this week") Any questions or concerns?: No (denies today- she clearly does not wish to have additional assistance with care needs; does not want to spend any significant time completing TOC call)  Items Reviewed: Did you receive and understand the discharge instructions provided?: Yes (briefly reviewed with patient who verbalizes good understanding of same- declines thorough review today) Medications obtained,verified, and reconciled?: No Medications Not Reviewed Reasons:: Other: (Patient adamantly declined review of medications) Any new allergies since your discharge?: No Dietary orders reviewed?: No (patient declined) Do you have support at home?: Yes People in Home:  (does not specify if she has relatives at home with her) Name of Support/Comfort Primary Source: Reports requires assistance in all self-care activities post-recent surgery; reports has supportive private duty caregiver who assists as/ if needed/ indicated  Medications Reviewed Today: Medications Reviewed Today     Reviewed by Michaela Corner, RN (Registered Nurse) on 07/03/23 at 1023  Med List Status: <None>   Medication  Order Taking? Sig Documenting Provider Last Dose Status Informant  acetaminophen (TYLENOL) 500 MG tablet [id number] No Take 500 mg by mouth as needed for mild pain (pain score 1-3) or moderate pain (pain score 4-6). [provider] unk Active Self, Pharmacy Records           Med Note Marilu Favre Jul 03, 2023 10:23 AM) 07/03/23: adamantly declines medication review during [facility] call today  Alcohol Swabs (ALCOHOL PADS) 70 % PADS [id number] No 1 Act by Does not apply route 2 (two) times daily. [name], MD Taking Active Self, Pharmacy Records  allopurinol (ZYLOPRIM) 100 MG tablet [id number] No Take 1 tablet (100 mg total) by mouth daily. Bensimhon, [name], MD 06/25/2023 Active Self, Pharmacy Records  Aromatic Inhalants Nickie Retort VAPOR Ballinger) [id number] No Inhale into the lungs as needed. [provider] 06/25/2023 Active Self, Pharmacy Records  atorvastatin (LIPITOR) 10 MG tablet [id number] No TAKE 1 TABLET BY MOUTH EVERYDAY AT BEDTIME [name], MD 06/24/2023 Active Self, Pharmacy Records  Blood Glucose Monitoring Suppl (ONE TOUCH ULTRA 2) w/Device KIT [id number] No 1 Act by Does not apply route 2 (two) times daily. [name], MD Taking Active Self, Pharmacy Records  gabapentin (NEURONTIN) 100 MG capsule [id number] No TAKE 1 CAPSULE BY MOUTH EVERYDAY AT BEDTIME  Patient taking differently: Take 100 mg by mouth daily.   Vivi Barrack, DPM 06/24/2023 Active Self, Pharmacy Records  hydrALAZINE (APRESOLINE) 25 MG tablet [id number] No TAKE 2 TABLETS (50 MG TOTAL) BY MOUTH 3 (THREE) TIMES DAILY. Bensimhon, [name], MD 06/25/2023 Active Self, Pharmacy Records  HYDROcodone-acetaminophen (NORCO/VICODIN) 5-325 MG tablet [id number]  Take 1 tablet by mouth every  4 (four) hours as needed for moderate pain (pain score 4-6) or severe pain (pain score 7-10). Joycelyn Das, MD  Active   Discontinued 01/05/23 1342 (Reorder)   losartan (COZAAR) 50 MG tablet [id number] No Take  50 mg by mouth daily. [provider] 06/25/2023 Active Self, Pharmacy Records  metFORMIN (GLUCOPHAGE) 500 MG tablet [id number] No TAKE 1 TABLET BY MOUTH EVERY DAY WITH [place] Carol [name], MD 06/25/2023 Active Self, Pharmacy Records  Naphazoline HCl (CLEAR EYES OP) [id number] No Place 2 drops into both eyes daily as needed (irritation). [provider] unk Active Self, Pharmacy Records           Med Note (ADSIDE, Novella Rob   Fri Dec 23, 2022  1:45 PM)    [facility] ULTRA test strip [id number] No 1 each by Other route 2 (two) times daily. as directed [name], MD Taking Active Self, Pharmacy Records  pantoprazole (PROTONIX) 40 MG tablet [id number] No TAKE 1 TABLET BY MOUTH EVERY DAY [name], MD 06/25/2023 Active Self, Pharmacy Records           Med Note (CRUTHIS, CHLOE C   Thu Jun 22, 2023 10:35 AM) Pt is adamant she is taking this medication daily. Dispense report does not support this claim.  Rivaroxaban (XARELTO) 15 MG TABS tablet [id number] No Take 1 tablet (15 mg total) by mouth daily with supper. Jacklynn Ganong, FNP Past Week Active Self, Pharmacy Records           Med Note Lia Hopping, Ty Hilts Jun 25, 2023  1:55 PM) Last dose was 11/7  spironolactone (ALDACTONE) 25 MG tablet [id number] No Take 1 tablet (25 mg total) by mouth daily. Bensimhon, [name], MD 06/24/2023 Active Self, Pharmacy Records  torsemide (DEMADEX) 20 MG tablet [id number] No Take 1 tablet (20 mg total) by mouth daily. Jacklynn Ganong, [place] 06/25/2023 Active Self, Pharmacy Records  TRELEGY ELLIPTA 100-62.5-25 MCG/ACT AEPB [id number] No Inhale ONE PUFF into THE lungs daily  Patient taking differently: Inhale 1 puff into the lungs as needed (wheezing/SOB).   [name], MD unk Active Self, Pharmacy Records           Med Note (ADSIDE, Butler Denmark M   Fri Dec 23, 2022  1:46 PM)              Home Care and Equipment/Supplies: Were Home Health Services Ordered?: Yes Name of Home  Health Agency:: [facility]- PT Has Agency set up a time to come to your home?: No (Patient reports she declined home health PT until she "ses the surgeon") EMR reviewed for Home Health Orders:  (Patient reports she declined home health PT until she "ses the surgeon") Any new equipment or medical supplies ordered?: Yes [facility]) Name of Medical supply agency?: [employer] Were you able to get the equipment/medical supplies?: Yes Do you have any questions related to the use of the equipment/supplies?: No  Functional Questionnaire: Do you need assistance with bathing/showering or dressing?: Yes (reports has "24-7 paid caregiver" that provides assistance for all care needs) Do you need assistance with meal preparation?: Yes Do you need assistance with eating?: Yes Do you have difficulty maintaining continence: Yes Do you need assistance with getting out of bed/getting out of a chair/moving?: Yes Do you have difficulty managing or taking your medications?: Yes  Follow up appointments reviewed: PCP Follow-up appointment confirmed?: No (reports she  is not going to schedule with PCP until "after appointment with surgeon"- declines  assistance today with scheduling with PCP for HFU) MD Provider Line Number:[phone] Given: No (verified well-established with current PCP) Specialist Hospital Follow-up appointment confirmed?: Yes Date of Specialist follow-up appointment?: 07/05/23 Follow-Up Specialty Provider:: 07/05/23- surgical provider; 07/07/23- cardiology/ CHF provider Do you need transportation to your follow-up appointment?: No Do you understand care options if your condition(s) worsen?: Yes-patient verbalized understanding  SDOH Interventions Today    Flowsheet Row Most Recent Value  SDOH Interventions   Food Insecurity Interventions Intervention Not Indicated  Housing Interventions Intervention Not Indicated  Transportation Interventions Intervention Not Indicated  [repoirts private duty  caregiver provides transportation]  Utilities Interventions Intervention Not Indicated       Caryl Pina, RN, BSN, Media planner  Transitions of Care  [facility] - Population [facility]  [place] [phone]: direct office

## 2023-07-05 ENCOUNTER — Telehealth: Payer: Self-pay | Admitting: Internal Medicine

## 2023-07-05 ENCOUNTER — Telehealth (HOSPITAL_COMMUNITY): Payer: Self-pay | Admitting: Licensed Clinical Social Worker

## 2023-07-05 NOTE — Telephone Encounter (Signed)
Spoke with [place]. Paperwork will be completed

## 2023-07-05 NOTE — Telephone Encounter (Signed)
[name] [name] - [facility] with [facility] [phone]  She would like someone to call her back.  She is trying to get an FL2 form to get this patient into a facility ASAP - Please call

## 2023-07-05 NOTE — Telephone Encounter (Signed)
H&V Care Navigation CSW Progress Note  Clinical Social Worker informed by paramedic that patient is concerned about caregiving next week while her caregiver is out of town.  Just got foot surgery and DC'd from hospital states that she is requiring help with all ADLS from caregiver including just standing from her wheelchair.  Caregiver is leaving for the holiday next week and patient reports she has no where she can go and no one she can ask for help during that time.  Wants to go to SNF despite declining during inpatient stay.  Patient interested in [place] [facility] so CSW called admissions at [phone] to discuss.  Reports they will need PT/OT/H&P/fl2 in order to start referral and do PA with insurance- need faxed to [phone].  CSW called PCP office to complete fl2- awaiting return call from staff.  Patient supposed to be seen by [facility]- CSW called liaison who states that patient refused to have them come out so she has been discharged from referral.  They would be unable to help with SNF placement if they were active.  CSW will continue to follow and help complete above steps to work towards placement   SDOH Screenings   Food Insecurity: No Food Insecurity (07/03/2023)  Housing: Low Risk  (07/03/2023)  Transportation Needs: No Transportation Needs (07/03/2023)  Utilities: Not At Risk (07/03/2023)  Depression (PHQ2-9): Low Risk  (12/29/2022)  Financial Resource Strain: Low Risk  (06/07/2018)  Recent Concern: Financial Resource Strain - Medium Risk (05/21/2018)  Stress: Stress Concern Present (02/22/2023)  Tobacco Use: High Risk (06/26/2023)    Burna Sis, LCSW Clinical Social Worker [facility] Desk#: [phone] Cell#: [phone]

## 2023-07-06 ENCOUNTER — Telehealth: Payer: Self-pay | Admitting: Internal Medicine

## 2023-07-06 NOTE — Telephone Encounter (Signed)
Patient is at home with a broken leg.  She is on an antibiotic cephalaxin 500 mg - This was given to her by the orthopedic doctor.  She is now experiencing painful urination - she said that it itches and burns.  She wants to know if she should send a urine specimen in to have it checked.  Please call patient at:  [phone]

## 2023-07-07 ENCOUNTER — Encounter (HOSPITAL_COMMUNITY): Payer: Medicare Other

## 2023-07-07 ENCOUNTER — Telehealth (HOSPITAL_COMMUNITY): Payer: Self-pay | Admitting: Licensed Clinical Social Worker

## 2023-07-07 DIAGNOSIS — S82841D Displaced bimalleolar fracture of right lower leg, subsequent encounter for closed fracture with routine healing: Secondary | ICD-10-CM | POA: Diagnosis not present

## 2023-07-07 NOTE — Telephone Encounter (Signed)
Per Eileen Stanford " ok so I just called her to provide some updates on things and she said she has now found a caregiver to come stay with her- if you have already basically finished the fl2 then I would still appreciate it in case this falls through but if you still have a lot to do then no worries "  advised Jenna to let me know if she needs It completed.

## 2023-07-07 NOTE — Telephone Encounter (Signed)
H&V Care Navigation CSW Progress Note  Clinical Social Worker was continuing to work on SNF placement but too many barriers- has been refusing home health so now DC'd from them and would need PT/OT note within 48hours of admission.  Patient not wanting to stay at SNF for more than maybe 5 days and facility would need them to stay at least 10.  CSW called pt to discuss these barriers and patient informed CSW that she located a friend to be her caregiver for those days following our last conversation about potential barriers to SNF.    No further needs at this time.  SDOH Screenings   Food Insecurity: No Food Insecurity (07/03/2023)  Housing: Low Risk  (07/03/2023)  Transportation Needs: No Transportation Needs (07/03/2023)  Utilities: Not At Risk (07/03/2023)  Depression (PHQ2-9): Low Risk  (12/29/2022)  Financial Resource Strain: Low Risk  (06/07/2018)  Recent Concern: Financial Resource Strain - Medium Risk (05/21/2018)  Stress: Stress Concern Present (02/22/2023)  Tobacco Use: High Risk (06/26/2023)   Burna Sis, LCSW Clinical Social Worker [facility] Desk#: [phone] Cell#: [phone]

## 2023-07-12 ENCOUNTER — Ambulatory Visit: Payer: Medicare Other

## 2023-07-12 DIAGNOSIS — Z0000 Encounter for general adult medical examination without abnormal findings: Secondary | ICD-10-CM

## 2023-07-12 NOTE — Patient Instructions (Signed)
[name] , Thank you for taking time to come for your Medicare Wellness Visit. I appreciate your ongoing commitment to your health goals. Please review the following plan we discussed and let me know if I can assist you in the future.   Screening recommendations/referrals: Colonoscopy: no longer required Mammogram: no longer required Bone Density: Education provided Recommended yearly ophthalmology/optometry visit for glaucoma screening and checkup Recommended yearly dental visit for hygiene and checkup  Vaccinations: Influenza vaccine: Education provided Pneumococcal vaccine: up to date Tdap vaccine: up to date Shingles vaccine: Education provided     Preventive Care 65 Years and Older, Female Preventive care refers to lifestyle choices and visits with your health care provider that can promote health and wellness. What does preventive care include? A yearly physical exam. This is also called an annual well check. Dental exams once or twice a year. Routine eye exams. Ask your health care provider how often you should have your eyes checked. Personal lifestyle choices, including: Daily care of your teeth and gums. Regular physical activity. Eating a healthy diet. Avoiding tobacco and drug use. Limiting alcohol use. Practicing safe sex. Taking low-dose aspirin every day. Taking vitamin and mineral supplements as recommended by your health care provider. What happens during an annual well check? The services and screenings done by your health care provider during your annual well check will depend on your age, overall health, lifestyle risk factors, and family history of disease. Counseling  Your health care provider may ask you questions about your: Alcohol use. Tobacco use. Drug use. Emotional well-being. Home and relationship well-being. Sexual activity. Eating habits. History of falls. Memory and ability to understand (cognition). Work and work  Astronomer. Reproductive health. Screening  You may have the following tests or measurements: Height, weight, and BMI. Blood pressure. Lipid and cholesterol levels. These may be checked every 5 years, or more frequently if you are over 42 years old. Skin check. Lung cancer screening. You may have this screening every year starting at age 26 if you have a 30-pack-year history of smoking and currently smoke or have quit within the past 15 years. Fecal occult blood test (FOBT) of the stool. You may have this test every year starting at age 68. Flexible sigmoidoscopy or colonoscopy. You may have a sigmoidoscopy every 5 years or a colonoscopy every 10 years starting at age 58. Hepatitis C blood test. Hepatitis B blood test. Sexually transmitted disease (STD) testing. Diabetes screening. This is done by checking your blood sugar (glucose) after you have not eaten for a while (fasting). You may have this done every 1-3 years. Bone density scan. This is done to screen for osteoporosis. You may have this done starting at age 76. Mammogram. This may be done every 1-2 years. Talk to your health care provider about how often you should have regular mammograms. Talk with your health care provider about your test results, treatment options, and if necessary, the need for more tests. Vaccines  Your health care provider may recommend certain vaccines, such as: Influenza vaccine. This is recommended every year. Tetanus, diphtheria, and acellular pertussis (Tdap, Td) vaccine. You may need a Td booster every 10 years. Zoster vaccine. You may need this after age 68. Pneumococcal 13-valent conjugate (PCV13) vaccine. One dose is recommended after age 23. Pneumococcal polysaccharide (PPSV23) vaccine. One dose is recommended after age 16. Talk to your health care provider about which screenings and vaccines you need and how often you need them. This information is not intended  to replace advice given to you by  your health care provider. Make sure you discuss any questions you have with your health care provider. Document Released: 08/28/2015 Document Revised: 04/20/2016 Document Reviewed: 06/02/2015 Elsevier Interactive Patient Education  2017 [employer].  Fall Prevention in the Home Falls can cause injuries. They can happen to people of all ages. There are many things you can do to make your home safe and to help prevent falls. What can I do on the outside of my home? Regularly fix the edges of walkways and driveways and fix any cracks. Remove anything that might make you trip as you walk through a door, such as a raised step or threshold. Trim any bushes or trees on the path to your home. Use bright outdoor lighting. Clear any walking paths of anything that might make someone trip, such as rocks or tools. Regularly check to see if handrails are loose or broken. Make sure that both sides of any steps have handrails. Any raised decks and porches should have guardrails on the edges. Have any leaves, snow, or ice cleared regularly. Use sand or salt on walking paths during winter. Clean up any spills in your garage right away. This includes oil or grease spills. What can I do in the bathroom? Use night lights. Install grab bars by the toilet and in the tub and shower. Do not use towel bars as grab bars. Use non-skid mats or decals in the tub or shower. If you need to sit down in the shower, use a plastic, non-slip stool. Keep the floor dry. Clean up any water that spills on the floor as soon as it happens. Remove soap buildup in the tub or shower regularly. Attach bath mats securely with double-sided non-slip rug tape. Do not have throw rugs and other things on the floor that can make you trip. What can I do in the bedroom? Use night lights. Make sure that you have a light by your bed that is easy to reach. Do not use any sheets or blankets that are too big for your bed. They should not hang  down onto the floor. Have a firm chair that has side arms. You can use this for support while you get dressed. Do not have throw rugs and other things on the floor that can make you trip. What can I do in the kitchen? Clean up any spills right away. Avoid walking on wet floors. Keep items that you use a lot in easy-to-reach places. If you need to reach something above you, use a strong step stool that has a grab bar. Keep electrical cords out of the way. Do not use floor polish or wax that makes floors slippery. If you must use wax, use non-skid floor wax. Do not have throw rugs and other things on the floor that can make you trip. What can I do with my stairs? Do not leave any items on the stairs. Make sure that there are handrails on both sides of the stairs and use them. Fix handrails that are broken or loose. Make sure that handrails are as long as the stairways. Check any carpeting to make sure that it is firmly attached to the stairs. Fix any carpet that is loose or worn. Avoid having throw rugs at the top or bottom of the stairs. If you do have throw rugs, attach them to the floor with carpet tape. Make sure that you have a light switch at the top of the stairs and  the bottom of the stairs. If you do not have them, ask someone to add them for you. What else can I do to help prevent falls? Wear shoes that: Do not have high heels. Have rubber bottoms. Are comfortable and fit you well. Are closed at the toe. Do not wear sandals. If you use a stepladder: Make sure that it is fully opened. Do not climb a closed stepladder. Make sure that both sides of the stepladder are locked into place. Ask someone to hold it for you, if possible. Clearly mark and make sure that you can see: Any grab bars or handrails. First and last steps. Where the edge of each step is. Use tools that help you move around (mobility aids) if they are needed. These  include: Canes. Walkers. Scooters. Crutches. Turn on the lights when you go into a dark area. Replace any light bulbs as soon as they burn out. Set up your furniture so you have a clear path. Avoid moving your furniture around. If any of your floors are uneven, fix them. If there are any pets around you, be aware of where they are. Review your medicines with your doctor. Some medicines can make you feel dizzy. This can increase your chance of falling. Ask your doctor what other things that you can do to help prevent falls. This information is not intended to replace advice given to you by your health care provider. Make sure you discuss any questions you have with your health care provider. Document Released: 05/28/2009 Document Revised: 01/07/2016 Document Reviewed: 09/05/2014 Elsevier Interactive Patient Education  2017 [employer].

## 2023-07-12 NOTE — Progress Notes (Signed)
Subjective:   [name] [name] is a 77 y.o. female who presents for an Initial Medicare Annual Wellness Visit.  Visit Complete: Virtual I connected with  [name] [name] on 07/12/23 by a audio enabled telemedicine application and verified that I am speaking with the correct person using two identifiers.  Patient Location: Home  Provider Location: Home Office  I discussed the limitations of evaluation and management by telemedicine. The patient expressed understanding and agreed to proceed.  Vital Signs: Because this visit was a virtual/telehealth visit, some criteria may be missing or patient reported. Any vitals not documented were not able to be obtained and vitals that have been documented are patient reported.  Cardiac Risk Factors include: advanced age (>47men, >59 women);hypertension;smoking/ tobacco exposure     Objective:    Today's Vitals   07/12/23 1506  PainSc: 7    There is no height or weight on file to calculate BMI.     07/12/2023    3:21 PM 06/26/2023    9:54 AM 06/25/2023    4:13 PM 06/25/2023    4:00 PM 06/22/2023    9:44 AM 06/18/2023    7:38 AM 01/11/2023    8:54 AM  Advanced Directives  Does Patient Have a Medical Advance Directive? No No  No Yes No No  Type of Advance Directive    Healthcare Power of [employer] Power of Attorney    Does patient want to make changes to medical advance directive?  No - Patient declined  No - Guardian declined No - Guardian declined    Copy of Healthcare Power of Attorney in Chart?    No - copy requested No - copy requested    Would patient like information on creating a medical advance directive?  No - Patient declined No - Patient declined        Current Medications (verified) Outpatient Encounter Medications as of 07/12/2023  Medication Sig   acetaminophen (TYLENOL) 500 MG tablet Take 500 mg by mouth as needed for mild pain (pain score 1-3) or moderate pain (pain score 4-6).   Alcohol Swabs (ALCOHOL PADS) 70 %  PADS 1 Act by Does not apply route 2 (two) times daily.   allopurinol (ZYLOPRIM) 100 MG tablet Take 1 tablet (100 mg total) by mouth daily.   Aromatic Inhalants (VICKS VAPOR INHALER IN) Inhale into the lungs as needed.   atorvastatin (LIPITOR) 10 MG tablet TAKE 1 TABLET BY MOUTH EVERYDAY AT BEDTIME   Blood Glucose Monitoring Suppl (ONE TOUCH ULTRA 2) w/Device KIT 1 Act by Does not apply route 2 (two) times daily.   gabapentin (NEURONTIN) 100 MG capsule TAKE 1 CAPSULE BY MOUTH EVERYDAY AT BEDTIME (Patient taking differently: Take 100 mg by mouth daily.)   hydrALAZINE (APRESOLINE) 25 MG tablet TAKE 2 TABLETS (50 MG TOTAL) BY MOUTH 3 (THREE) TIMES DAILY.   losartan (COZAAR) 50 MG tablet Take 50 mg by mouth daily.   metFORMIN (GLUCOPHAGE) 500 MG tablet TAKE 1 TABLET BY MOUTH EVERY DAY WITH BREAKFAST   Naphazoline HCl (CLEAR EYES OP) Place 2 drops into both eyes daily as needed (irritation).   ONETOUCH ULTRA test strip 1 each by Other route 2 (two) times daily. as directed   pantoprazole (PROTONIX) 40 MG tablet TAKE 1 TABLET BY MOUTH EVERY DAY   Rivaroxaban (XARELTO) 15 MG TABS tablet Take 1 tablet (15 mg total) by mouth daily with supper.   spironolactone (ALDACTONE) 25 MG tablet Take 1 tablet (25 mg total) by mouth  daily.   torsemide (DEMADEX) 20 MG tablet Take 1 tablet (20 mg total) by mouth daily.   TRELEGY ELLIPTA 100-62.5-25 MCG/ACT AEPB Inhale ONE PUFF into THE lungs daily (Patient taking differently: Inhale 1 puff into the lungs as needed (wheezing/SOB).)   HYDROcodone-acetaminophen (NORCO/VICODIN) 5-325 MG tablet Take 1 tablet by mouth every 4 (four) hours as needed for moderate pain (pain score 4-6) or severe pain (pain score 7-10). (Patient not taking: Reported on 07/12/2023)   [DISCONTINUED] losartan (COZAAR) 50 MG tablet Take 1 tablet (50 mg total) by mouth daily.   No facility-administered encounter medications on file as of 07/12/2023.    Allergies (verified) Bee venom, Ivp dye  [iodinated contrast media], Ace inhibitors, Atenolol, Codeine, Entresto [sacubitril-valsartan], Isosorbide, Shrimp [shellfish allergy], Simvastatin, Jardiance [empagliflozin], and Penicillin g   History: Past Medical History:  Diagnosis Date   (HFimpEF) heart failure with improved ejection fraction (HCC)    a. 07/2018 Echo: EF 30-35%; b. 11/2019 Echo: EF 55-60%, no rwma, Gr2 DD, Nl RV size/fxn. Mild BAE. Mild MR/AI.   Arthritis    Asthma    Atopic dermatitis    CAD (coronary artery disease)    a. 2016 s/p CABG x 2 (LIMA->LAD, VG->OM); b. 08/2018 MV: EF 44%, no ischemia/infact.   Cardiac arrest (HCC) 10/2014   Cardiomyopathy, ischemic    a. 07/2018 Echo: EF 30-35%; 11/2019 Echo: EF 55-60%.   Carotid arterial disease (HCC)    a. 11/2019 Carotid U/S   CHF (congestive heart failure) (HCC)    CKD (chronic kidney disease), stage III (HCC)    COPD (chronic obstructive pulmonary disease) (HCC)    Diabetes mellitus without complication (HCC)    Dysrhythmia    Esophageal dilatation 2013   GERD (gastroesophageal reflux disease)    Glaucoma    Gout    Headache    Hearing loss    History of blood transfusion    Hypertension    Hypokalemia    Idiopathic angioedema    LGI bleed 08/06/2017   a. felt to be hemorrhoidal during that admission (no drop in Hgb).   Lower back pain    Paroxysmal atrial fibrillation (HCC)    a. Dx 2016-->h/o difficult to control rates (complicated by noncompliance), not felt to be a candidate for ablation or antiarrhythmic due to noncompliance; b. Recurrent AF 2021 - converted w/ IV dilt; c. CHA2DS2VASc = 7-->Xarelto.   Personal history of noncompliance with medical treatment, presenting hazards to health    Pneumonia    Prediabetes    Presence of permanent cardiac pacemaker    Right leg numbness    S/P CABG x 2 with clipping of LA appendage 11/14/2014   LIMA to LAD, SVG to OM, EVH via right thigh   Urine incontinence    Uterine fibroid    Past Surgical History:   Procedure Laterality Date   AV NODE ABLATION N/A 01/18/2021   Procedure: AV NODE ABLATION;  Surgeon: [name], MD;  Location: [facility] INVASIVE CV LAB;  Service: Cardiovascular;  Laterality: N/A;   BALLOON DILATION N/A 10/10/2019   Procedure: BALLOON DILATION;  Surgeon: Kathi Der, MD;  Location: [facility] ENDOSCOPY;  Service: Gastroenterology;  Laterality: N/A;   BIOPSY  10/10/2019   Procedure: BIOPSY;  Surgeon: Kathi Der, MD;  Location: [facility] ENDOSCOPY;  Service: Gastroenterology;;   BIOPSY  10/11/2019   Procedure: BIOPSY;  Surgeon: Kathi Der, MD;  Location: [facility] ENDOSCOPY;  Service: Gastroenterology;;   Earnstine Regal N/A 11/21/2022   Procedure: BIVI  ICD  UPGRADE;  Surgeon: [name], MD;  Location: [facility] INVASIVE CV LAB;  Service: Cardiovascular;  Laterality: N/A;   CARDIOVERSION N/A 11/18/2014   Procedure: CARDIOVERSION;  Surgeon: Chrystie Nose, MD;  Location: [facility] OR;  Service: Cardiovascular;  Laterality: N/A;   CARDIOVERSION N/A 12/25/2017   Procedure: CARDIOVERSION;  Surgeon: Dolores Patty, MD;  Location: [facility] ENDOSCOPY;  Service: Cardiovascular;  Laterality: N/A;   CLIPPING OF ATRIAL APPENDAGE N/A 11/14/2014   Procedure: CLIPPING OF ATRIAL APPENDAGE;  Surgeon: Purcell Nails, MD;  Location: [facility] OR;  Service: Open Heart Surgery;  Laterality: N/A;   COLONOSCOPY  2013   COLONOSCOPY WITH PROPOFOL N/A 10/11/2019   Procedure: COLONOSCOPY WITH PROPOFOL;  Surgeon: Kathi Der, MD;  Location: [facility] ENDOSCOPY;  Service: Gastroenterology;  Laterality: N/A;   CORONARY ARTERY BYPASS GRAFT N/A 11/14/2014   Procedure: CORONARY ARTERY BYPASS GRAFTING (CABG)TIMES 2 USING LEFT INTERNAL MAMMARY ARTERY AND RIGHT SAPHENOUS VEIN HARVESTED ENDOSCOPICALLY;  Surgeon: Purcell Nails, MD;  Location: [facility] OR;  Service: Open Heart Surgery;  Laterality: N/A;   ESOPHAGOGASTRODUODENOSCOPY (EGD) WITH PROPOFOL N/A 10/10/2019   Procedure: ESOPHAGOGASTRODUODENOSCOPY (EGD) WITH PROPOFOL;  Surgeon:  Kathi Der, MD;  Location: [facility] ENDOS[facility];  Service: Gastroenterology;  Laterality: N/A;   ESOPHAGOGASTRODUODENOSCOPY (EGD) WITH PROPOFOL N/A 10/27/2019   Procedure: ESOPHAGOGASTRODUODENOSCOPY (EGD) WITH PROPOFOL;  Surgeon: Kerin Salen, MD;  Location: [facility] ENDOSCOPY;  Service: Gastroenterology;  Laterality: N/A;   GIVENS CAPSULE STUDY N/A 10/27/2019   Procedure: GIVENS CAPSULE STUDY;  Surgeon: Kerin Salen, MD;  Location: [facility] ENDOSCOPY;  Service: Gastroenterology;  Laterality: N/A;   INSERT / REPLACE / REMOVE PACEMAKER     LEFT HEART CATHETERIZATION WITH CORONARY ANGIOGRAM N/A 11/04/2014   Procedure: LEFT HEART CATHETERIZATION WITH CORONARY ANGIOGRAM;  Surgeon: Lennette Bihari, MD;  Location: [facility] CATH LAB;  Service: Cardiovascular;  Laterality: N/A;   ORIF ANKLE FRACTURE Right 06/26/2023   Procedure: OPEN REDUCTION INTERNAL FIXATION (ORIF) BIMALLEOLAR ANKLE FRACTURE;  Surgeon: Netta Cedars, MD;  Location: [facility] ORS;  Service: Orthopedics;  Laterality: Right;   PACEMAKER IMPLANT N/A 01/18/2021   Procedure: PACEMAKER IMPLANT;  Surgeon: [name], MD;  Location: [facility] INVASIVE CV LAB;  Service: Cardiovascular;  Laterality: N/A;   PACEMAKER IMPLANT Left    POLYPECTOMY  10/11/2019   Procedure: POLYPECTOMY;  Surgeon: Kathi Der, MD;  Location: [facility] ENDOSCOPY;  Service: Gastroenterology;;   RIGHT HEART CATH N/A 06/08/2022   Procedure: RIGHT HEART CATH;  Surgeon: Dolores Patty, MD;  Location: [facility] INVASIVE CV LAB;  Service: Cardiovascular;  Laterality: N/A;   TEE WITHOUT CARDIOVERSION N/A 11/14/2014   Procedure: TRANSESOPHAGEAL ECHOCARDIOGRAM (TEE);  Surgeon: Purcell Nails, MD;  Location: [facility] OR;  Service: Open Heart Surgery;  Laterality: N/A;   TEE WITHOUT CARDIOVERSION N/A 12/25/2017   Procedure: TRANSESOPHAGEAL ECHOCARDIOGRAM (TEE);  Surgeon: Dolores Patty, MD;  Location: [name] ENDOSCOPY;  Service: Cardiovascular;  Laterality: N/A;   TEMPORARY PACEMAKER INSERTION  11/04/2014    Procedure: TEMPORARY PACEMAKER INSERTION;  Surgeon: Lennette Bihari, MD;  Location: [facility] CATH LAB;  Service: Cardiovascular;;   TOOTH EXTRACTION     Family History  Problem Relation Age of Onset   Cancer Mother        LYMPHOMA   Heart disease Father    Other Father        TB   CVA Sister    Prostate cancer Brother 27   Diabetes Brother    Social History   Socioeconomic History   Marital status: Widowed  Spouse name: Not on file   Number of children: 1   Years of education: 76   Highest education level: Not on file  Occupational History   Occupation: RETIRED [employer]  Tobacco Use   Smoking status: Some Days    Current packs/day: 0.00    Average packs/day: 0.5 packs/day for 56.0 years (28.0 ttl pk-yrs)    Types: Cigarettes    Start date: 11/13/1965    Last attempt to quit: 11/13/2021    Years since quitting: 1.6   Smokeless tobacco: Never   Tobacco comments:    smoking 2 cigarettes/day as of 10/23/20  Vaping Use   Vaping status: Never Used  Substance and Sexual Activity   Alcohol use: No    Alcohol/week: 0.0 standard drinks of alcohol   Drug use: No   Sexual activity: Not Currently  Other Topics Concern   Not on file  Social History Narrative   Patient reports it being difficult to pay for everything due to outstanding medical bills from hospital stay last year but states she is able to keep up with basic expenses.  Patient does have some concerns with her house's condition due to a water leak- had her roof repaired last month but has some leaking in the front which has affected her porch.  Patient owns her own car and is able to drive herself but has some concerns about the reliability of her car- has gotten taxi to the clinic for appointment in the past when her car broke down.   Social Determinants of Health   Financial Resource Strain: Low Risk  (07/12/2023)   Overall Financial Resource Strain (CARDIA)    Difficulty of Paying Living Expenses: Not hard at all   Food Insecurity: No Food Insecurity (07/03/2023)   Hunger Vital Sign    Worried About Running Out of Food in the Last Year: Never true    Ran Out of Food in the Last Year: Never true  Transportation Needs: No Transportation Needs (07/03/2023)   PRAPARE - Administrator, Civil Service (Medical): No    Lack of Transportation (Non-Medical): No  Physical Activity: Inactive (07/12/2023)   Exercise Vital Sign    Days of Exercise per Week: 0 days    Minutes of Exercise per Session: 0 min  Stress: Stress Concern Present (07/12/2023)   [employer] of Occupational Health - Occupational Stress Questionnaire    Feeling of Stress : Very much  Social Connections: Not on file    Tobacco Counseling Ready to quit: Not Answered Counseling given: Not Answered Tobacco comments: smoking 2 cigarettes/day as of 10/23/20   Clinical Intake:  Pre-visit preparation completed: Yes  Pain : 0-10 Pain Score: 7  Pain Type: Acute pain Pain Location: Ankle Pain Descriptors / Indicators: Constant, Burning, Aching Pain Onset: 1 to 4 weeks ago Pain Frequency: Constant     Diabetes: Yes CBG done?: No Did pt. bring in CBG monitor from home?: No  How often do you need to have someone help you when you read instructions, pamphlets, or other written materials from your doctor or pharmacy?: 1 - Never  Interpreter Needed?: No  Information entered by :: Remi Haggard LPN   Activities of Daily Living    07/12/2023    3:12 PM 06/25/2023    3:00 PM  In your present state of health, do you have any difficulty performing the following activities:  Hearing? 1 0  Vision? 0 0  Difficulty concentrating or making decisions? 0  0  Walking or climbing stairs? 1   Dressing or bathing? 1   Doing errands, shopping? 1 1  Preparing Food and eating ? Y   Using the Toilet? Y   In the past six months, have you accidently leaked urine? Y   Do you have problems with loss of bowel control? N   Managing  your Medications? N   Managing your Finances? N   Housekeeping or managing your Housekeeping? Y     Patient Care Team: [name], MD as PCP - General (Internal Medicine) [name], MD as PCP - Cardiology (Cardiology) [name], MD as PCP - Electrophysiology (Cardiology) Bensimhon, [name], MD as PCP - Advanced Heart Failure (Cardiology) [name] [name], MD as Consulting Physician (Cardiology)  Indicate any recent Medical Services you may have received from other than [facility] providers in the past year (date may be approximate).     Assessment:   This is a routine wellness examination for [name] [name].  Hearing/Vision screen Hearing Screening - Comments:: Some trouble hearing Vision Screening - Comments:: Not up to date [place]   Goals Addressed   None    Depression Screen    07/12/2023    3:08 PM 12/29/2022    3:23 PM 03/16/2022    1:03 PM 04/04/2019   11:38 AM 09/28/2017    3:55 PM  PHQ 2/9 Scores  PHQ - 2 Score 6 0 0 0 2  PHQ- 9 Score 10  0  5  Exception Documentation   Patient refusal      Fall Risk    07/12/2023    3:04 PM 12/29/2022    3:23 PM 03/16/2022    1:03 PM 09/28/2017    3:55 PM  Fall Risk   Falls in the past year? 1 0 0 No  Number falls in past yr: 0 0    Injury with Fall? 1 0    Risk for fall due to :  No Fall Risks    Follow up Falls evaluation completed;Education provided;Falls prevention discussed Falls evaluation completed      MEDICARE RISK AT HOME: Medicare Risk at Home Any stairs in or around the home?: Yes If so, are there any without handrails?: No Home free of loose throw rugs in walkways, pet beds, electrical cords, etc?: Yes Adequate lighting in your home to reduce risk of falls?: Yes Life alert?: No Use of a cane, walker or w/c?: Yes Grab bars in the bathroom?: Yes Shower chair or bench in shower?: Yes Elevated toilet seat or a handicapped toilet?: Yes  TIMED UP AND GO:  Was the test performed? No    Cognitive  Function:        07/12/2023    3:10 PM  6CIT Screen  What Year? 0 points  What month? 0 points  What time? 0 points  Count back from 20 2 points  Months in reverse 4 points  Repeat phrase 0 points  Total Score 6 points    Immunizations Immunization History  Administered Date(s) Administered   Fluad Quad(high Dose 65+) 06/16/2022   Influenza Split 07/29/2010, 05/04/2012, 07/02/2013   Influenza, High Dose Seasonal PF 07/14/2015, 06/15/2016, 06/23/2017, 08/21/2018, 06/22/2019   Influenza-Unspecified 07/07/2014   PFIZER(Purple Top)SARS-COV-2 Vaccination 03/04/2020   Pneumococcal Conjugate-13 07/07/2014   Pneumococcal Polysaccharide-23 05/04/2012, 06/16/2022   Td 12/20/2004, 04/13/2015    TDAP status: Up to date  Flu Vaccine status: Due, Education has been provided regarding the importance of this vaccine. Advised may receive  this vaccine at local pharmacy or Health Dept. Aware to provide a copy of the vaccination record if obtained from local pharmacy or Health Dept. Verbalized acceptance and understanding.  Pneumococcal vaccine status: Up to date  Covid-19 vaccine status: Information provided on how to obtain vaccines.   Qualifies for Shingles Vaccine? Yes   Zostavax completed No   Shingrix Completed?: No.    Education has been provided regarding the importance of this vaccine. Patient has been advised to call insurance company to determine out of pocket expense if they have not yet received this vaccine. Advised may also receive vaccine at local pharmacy or Health Dept. Verbalized acceptance and understanding.  Screening Tests Health Maintenance  Topic Date Due   OPHTHALMOLOGY EXAM  Never done   Diabetic kidney evaluation - Urine ACR  03/17/2023   FOOT EXAM  03/17/2023   COVID-19 Vaccine (2 - Pfizer risk series) 07/28/2023 (Originally 03/25/2020)   Zoster Vaccines- Shingrix (1 of 2) 10/12/2023 (Originally 12/13/1964)   INFLUENZA VACCINE  11/13/2023 (Originally 03/16/2023)    Lung Cancer Screening  07/11/2024 (Originally 07/22/2018)   DEXA SCAN  07/11/2024 (Originally 12/12/2009)   Hepatitis C Screening  07/11/2024 (Originally 12/13/1963)   HEMOGLOBIN A1C  12/24/2023   Diabetic kidney evaluation - eGFR measurement  06/27/2024   Medicare Annual Wellness (AWV)  07/11/2024   DTaP/Tdap/Td (3 - Tdap) 04/12/2025   Pneumonia Vaccine 45+ Years old  Completed   HPV VACCINES  Aged Out   Colonoscopy  Discontinued    Health Maintenance  Health Maintenance Due  Topic Date Due   OPHTHALMOLOGY EXAM  Never done   Diabetic kidney evaluation - Urine ACR  03/17/2023   FOOT EXAM  03/17/2023    Colorectal cancer screening: No longer required.   Mammogram status: No longer required due to age.  Bone Density   Declined  Lung Cancer Screening: (Low Dose CT Chest recommended if Age 71-80 years, 20 pack-year currently smoking OR have quit w/in 15years.) does qualify.   Lung Cancer Screening Referral:  declined patient is recovering from a broken foot right now  Additional Screening:  Hepatitis C Screening  never done  Vision Screening: Recommended annual ophthalmology exams for early detection of glaucoma and other disorders of the eye. Is the patient up to date with their annual eye exam?  No  Who is the provider or what is the name of the office in which the patient attends annual eye exams?  If pt is not established with a provider, would they like to be referred to a provider to establish care? No .   Dental Screening: Recommended annual dental exams for proper oral hygiene  Nutrition Risk Assessment:  Has the patient had any N/V/D within the last 2 months?  No  Does the patient have any non-healing wounds?  No  Has the patient had any unintentional weight loss or weight gain?  No   Diabetes:  Is the patient diabetic?  Yes  If diabetic, was a CBG obtained today?  No  Did the patient bring in their glucometer from home?  No  How often do you monitor your  CBG's? .   Financial Strains and Diabetes Management:  Are you having any financial strains with the device, your supplies or your medication? No .  Does the patient want to be seen by Chronic Care Management for management of their diabetes?  No  Would the patient like to be referred to a Nutritionist or for Diabetic Management?  No  Diabetic Exams:  Diabetic Eye Exam: . Pt has been advised about the importance in completing this exam.   Diabetic Foot Exam: Pt has been advised about the importance in completing this exam.    Community Resource Referral / Chronic Care Management: CRR required this visit?  No   CCM required this visit?  No     Plan:     I have personally reviewed and noted the following in the patient's chart:   Medical and social history Use of alcohol, tobacco or illicit drugs  Current medications and supplements including opioid prescriptions. Patient is not currently taking opioid prescriptions. Functional ability and status Nutritional status Physical activity Advanced directives List of other physicians Hospitalizations, surgeries, and ER visits in previous 12 months Vitals Screenings to include cognitive, depression, and falls Referrals and appointments  In addition, I have reviewed and discussed with patient certain preventive protocols, quality metrics, and best practice recommendations. A written personalized care plan for preventive services as well as general preventive health recommendations were provided to patient.     Remi Haggard, LPN   16/05/[id number]   After Visit Summary: (MyChart) Due to this being a telephonic visit, the after visit summary with patients personalized plan was offered to patient via MyChart   Nurse Notes:

## 2023-07-18 ENCOUNTER — Ambulatory Visit (INDEPENDENT_AMBULATORY_CARE_PROVIDER_SITE_OTHER): Payer: Medicare Other

## 2023-07-18 DIAGNOSIS — I340 Nonrheumatic mitral (valve) insufficiency: Secondary | ICD-10-CM

## 2023-07-19 LAB — CUP PACEART REMOTE DEVICE CHECK
Battery Voltage: 75
Date Time Interrogation Session: 20241204144201
Implantable Lead Connection Status: 753985
Implantable Lead Implant Date: 20220606
Implantable Lead Location: 753860
Implantable Lead Model: 377169
Implantable Lead Serial Number: 8000396500
Implantable Pulse Generator Implant Date: 20220606
Pulse Gen Model: 407145
Pulse Gen Serial Number: 70105656

## 2023-07-21 ENCOUNTER — Encounter: Payer: Self-pay | Admitting: Family Medicine

## 2023-07-21 ENCOUNTER — Ambulatory Visit (INDEPENDENT_AMBULATORY_CARE_PROVIDER_SITE_OTHER): Payer: Medicare Other | Admitting: Family Medicine

## 2023-07-21 VITALS — BP 142/82 | HR 61 | Temp 97.4°F | Ht 67.5 in

## 2023-07-21 DIAGNOSIS — R300 Dysuria: Secondary | ICD-10-CM

## 2023-07-21 LAB — POCT URINALYSIS DIPSTICK
Bilirubin, UA: NEGATIVE
Blood, UA: NEGATIVE
Glucose, UA: NEGATIVE
Ketones, UA: NEGATIVE
Leukocytes, UA: NEGATIVE
Nitrite, UA: NEGATIVE
Protein, UA: POSITIVE — AB
Spec Grav, UA: >=1.03 — AB (ref 1.010–1.025)
Urobilinogen, UA: NEGATIVE U/dL — AB
pH, UA: 6 (ref 5.0–8.0)

## 2023-07-21 NOTE — Progress Notes (Signed)
   Acute Office Visit  Subjective:     Patient ID: [name] [name], female    DOB: [birth year], 77 y.o.   MRN: [id number]  Chief Complaint  Patient presents with   Burning with urination     HPI 77 year old female presents for evaluation of dysuria today. This is been going on for the last 2 to 3 days. Has not attempted OTC treatment. States that she feels like it could also be a skin irritation from using a sanitizer or wipes. Denies hematuria, flank pain, abdominal pain, nausea, vomiting, diarrhea, rash, chills, fever, other symptoms. Medical history as outlined below.  ROS Per HPI     Objective:    BP (!) 142/82 Comment: Has not taken meds today  Pulse 61   Temp (!) 97.4 F (36.3 C) (Oral)   Ht 5' 7.5" (1.715 m)   SpO2 97%   BMI 29.02 kg/m    Physical Exam Vitals and nursing note reviewed.  Constitutional:      Appearance: Normal appearance.  HENT:     Head: Normocephalic and atraumatic.  Eyes:     Extraocular Movements: Extraocular movements intact.  Cardiovascular:     Rate and Rhythm: Rhythm irregular.     Heart sounds: Normal heart sounds.  Pulmonary:     Effort: Pulmonary effort is normal.     Breath sounds: Normal breath sounds.  Abdominal:     General: Abdomen is flat. There is no distension.     Palpations: Abdomen is soft.     Tenderness: There is no abdominal tenderness. There is no right CVA tenderness, left CVA tenderness or guarding.  Musculoskeletal:     Cervical back: Normal range of motion.     Comments: Right lower leg in hard cast, patient in wheelchair for visit  Neurological:     Mental Status: She is alert.     Results for orders placed or performed in visit on 07/21/23  POCT urinalysis dipstick  Result Value Ref Range   Color, UA yellow    Clarity, UA clear    Glucose, UA Negative Negative   Bilirubin, UA neg    Ketones, UA neg    Spec Grav, UA >=1.030 (A) 1.010 - 1.025   Blood, UA neg    pH, UA 6.0 5.0 - 8.0   Protein, UA  Positive (A) Negative   Urobilinogen, UA negative (A) 0.2 or 1.0 E.U./dL   Nitrite, UA neg    Leukocytes, UA Negative Negative   Appearance clear    Odor none         Assessment & Plan:  1. Dysuria  - POCT urinalysis dipstick - Urine Culture -Discussed that she may use A&E ointment to the area as needed -Urine unremarkable for infection, will culture - Follow-up with me for new or worsening symptoms.    No orders of the defined types were placed in this encounter.   Return if symptoms worsen or fail to improve.  Moshe Cipro, FNP

## 2023-07-22 ENCOUNTER — Other Ambulatory Visit: Payer: Self-pay | Admitting: Internal Medicine

## 2023-07-22 DIAGNOSIS — E785 Hyperlipidemia, unspecified: Secondary | ICD-10-CM

## 2023-07-23 LAB — URINE CULTURE: Result:: NO GROWTH

## 2023-07-24 ENCOUNTER — Other Ambulatory Visit: Payer: Self-pay | Admitting: Podiatry

## 2023-07-24 ENCOUNTER — Telehealth: Payer: Self-pay | Admitting: Internal Medicine

## 2023-07-24 NOTE — Telephone Encounter (Signed)
Patient wants to know if she can get samples of xarelto.  Please call patient and advise.  [phone]

## 2023-07-24 NOTE — Telephone Encounter (Signed)
Advised patient that we were currently out of Xarelto samples. I checked in her chart and saw that her cardiologist is who prescribes this medication. I advised her to give their office a call to see if they have any samples. She gave a verbal understanding.

## 2023-07-25 DIAGNOSIS — S82841D Displaced bimalleolar fracture of right lower leg, subsequent encounter for closed fracture with routine healing: Secondary | ICD-10-CM | POA: Diagnosis not present

## 2023-07-27 ENCOUNTER — Other Ambulatory Visit (HOSPITAL_COMMUNITY): Payer: Self-pay

## 2023-07-27 ENCOUNTER — Telehealth (HOSPITAL_COMMUNITY): Payer: Self-pay | Admitting: Licensed Clinical Social Worker

## 2023-07-27 NOTE — Telephone Encounter (Signed)
H&V Care Navigation CSW Progress Note  Clinical Social Worker received call from paramedic who reports patient is unable to afford xarelto as she has entered the donut hole for coverage and would cost over $100 for 30 day supply.  CSW consulted with patient advocate who confirmed samples would be the only way- only 10mg  samples available so will have to take 1.5 to get 15mg  dose. Paramedic aware and will pick up from clinic.   SDOH Screenings   Food Insecurity: No Food Insecurity (07/03/2023)  Housing: Low Risk  (07/03/2023)  Transportation Needs: No Transportation Needs (07/03/2023)  Utilities: Not At Risk (07/03/2023)  Alcohol Screen: Low Risk  (07/12/2023)  Depression (PHQ2-9): Medium Risk (07/12/2023)  Financial Resource Strain: Low Risk  (07/12/2023)  Physical Activity: Inactive (07/12/2023)  Stress: Stress Concern Present (07/12/2023)  Tobacco Use: High Risk (07/21/2023)    Burna Sis, LCSW Clinical Social Worker [facility] Desk#: [phone] Cell#: [phone]

## 2023-07-28 ENCOUNTER — Encounter: Payer: Self-pay | Admitting: Cardiology

## 2023-08-21 ENCOUNTER — Other Ambulatory Visit: Payer: Self-pay | Admitting: Internal Medicine

## 2023-08-23 ENCOUNTER — Encounter (HOSPITAL_COMMUNITY): Payer: Self-pay | Admitting: *Deleted

## 2023-08-28 ENCOUNTER — Other Ambulatory Visit (HOSPITAL_COMMUNITY): Payer: Self-pay

## 2023-08-29 DIAGNOSIS — S82841D Displaced bimalleolar fracture of right lower leg, subsequent encounter for closed fracture with routine healing: Secondary | ICD-10-CM | POA: Diagnosis not present

## 2023-09-06 ENCOUNTER — Other Ambulatory Visit (HOSPITAL_COMMUNITY): Payer: Self-pay

## 2023-09-06 ENCOUNTER — Telehealth (HOSPITAL_COMMUNITY): Payer: Self-pay

## 2023-09-06 NOTE — Telephone Encounter (Signed)
Advanced [facility] Failure Patient Advocate Encounter  Medication Samples have been provided to patient directly Drug name: Xarelto 10 MG Qty: 6x 7 ct packages LOT: [id number] Exp.: 10/2024 SIG: Take 1 tablet by mouth once daily   The patient has been instructed regarding the correct time, dose, and frequency of taking this medication, including desired effects and most common side effects.   Paramedicine is making sure pt is aware to take 1.5 tablets by mouth daily.   I have messaged paramedicine with assistance options and copay information for this patient. Current test claims return $711 for a 90 day supply, as pt has $570 remaining on deductible, and a $141 copay. Samples provided for 1 month so that pt and paramedicine can plan for future refills.  Burnell Blanks, CPhT Rx Patient Advocate Phone: [phone]

## 2023-09-07 ENCOUNTER — Telehealth (HOSPITAL_COMMUNITY): Payer: Self-pay | Admitting: Emergency Medicine

## 2023-09-07 ENCOUNTER — Other Ambulatory Visit (HOSPITAL_COMMUNITY): Payer: Self-pay | Admitting: Emergency Medicine

## 2023-09-07 NOTE — Telephone Encounter (Signed)
Per phone conversation w/ Tonye Becket pt to be discharged from Paramedicine program.  I have a home visit for 09/12/23 @ 2:00 and will address discharge at that time.    [name], EMT-Paramedic [phone] 09/07/2023

## 2023-09-07 NOTE — Progress Notes (Addendum)
Paramedicine Encounter    Patient ID: [name] [name], female    DOB: 09/24/1945, 78 y.o.   MRN: [id number]   Complaints NONE  Assessment A&O x 4, skin W&D w/ good color.  Denies chest pain or SOB.  Lung sounds clear and equal throughout w/ no edema noted.  Compliance with meds YES  Pill box filled n/a  Refills needed Trelegy  Meds changes since last visit none    Social changes NONE   BP (!) 160/90 (BP Location: Right Arm, Patient Position: Sitting, Cuff Size: Normal)   Pulse 60   SpO2 98%  Weight yesterday-not taken Last visit weight-not taken  Home visit with [name] [name] finds her A&O x 4, skin W&D w/ good color. She has been dealing with healing from an ankle surgery and has been non-weight bearing and immobile for the most part.  She has finally gotten her cast off and has a boot that she can wear but she has a couple of wounds that are  healing so she only wears the boot if she has to move to the bathroom.  The wounds appear to be healing well.   I reviewed each of her meds individually and she advises that she is compliant with all of her medicines.  Based on the dates meds filled and quantities remaining in the bottles I question actual compliance but she says she is.   Assisted pt in f/u visit at [facility] w/ APP 10/11/23 @ 1:30.   Provided pt w/ 1 month's worth of 10mg . Xarelto samples.  Pt to take 1.5 tab (15mg ) daily.   Next home visit scheduled  09/12/23 @ 2:00.   ACTION: Home visit completed  [name] [name] [phone] 09/07/23  Patient Care Team: [name], MD as PCP - General (Internal Medicine) [name], MD as PCP - Cardiology (Cardiology) [name], MD as PCP - Electrophysiology (Cardiology) Bensimhon, [name], MD as PCP - Advanced Heart Failure (Cardiology) Chrystie Nose, MD as Consulting Physician (Cardiology)  Patient Active Problem List   Diagnosis Date Noted   Ankle fracture 06/25/2023   Closed bimalleolar  fracture of right ankle 06/20/2023   Pain in joint of right ankle 06/19/2023   Dysuria 12/29/2022   Cough productive of purulent sputum 12/29/2022   Type 2 diabetes mellitus with hyperlipidemia (HCC) 12/14/2022   Acute cough 12/12/2022   Ceruminosis, bilateral 06/17/2022   Flu vaccine need 06/17/2022   Chronic combined systolic and diastolic CHF (congestive heart failure) (HCC) 06/17/2022   Encounter for general adult medical examination with abnormal findings 06/16/2022   Chronic anticoagulation 06/06/2022   Mitral regurgitation 06/06/2022   Controlled type 2 diabetes mellitus without complication, without long-term current use of insulin (HCC) 06/06/2022   Pacemaker 02/22/2022   Hypertensive urgency 06/08/2021   Obstructive sleep apnea 09/21/2020   Obesity (BMI 30.0-34.9) 02/10/2020   COPD (chronic obstructive pulmonary disease) (HCC) 10/26/2019   Thrombocytopenia (HCC) 09/14/2017   Medication noncompliance due to cognitive impairment 09/06/2017   Persistent atrial fibrillation (HCC) 08/04/2017   Cardiomyopathy, ischemic 02/08/2017   CKD (chronic kidney disease), stage III (HCC) 01/30/2017   Coronary artery disease due to lipid rich plaque    Essential hypertension     Current Outpatient Medications:    acetaminophen (TYLENOL) 500 MG tablet, Take 500 mg by mouth as needed for mild pain (pain score 1-3) or moderate pain (pain score 4-6)., Disp: , Rfl:    allopurinol (ZYLOPRIM) 100 MG tablet, Take  1 tablet (100 mg total) by mouth daily., Disp: 30 tablet, Rfl: 6   Aromatic Inhalants (VICKS VAPOR INHALER IN), Inhale into the lungs as needed., Disp: , Rfl:    atorvastatin (LIPITOR) 10 MG tablet, TAKE 1 TABLET BY MOUTH EVERYDAY AT BEDTIME, Disp: 90 tablet, Rfl: 0   gabapentin (NEURONTIN) 100 MG capsule, TAKE 1 CAPSULE BY MOUTH EVERYDAY AT BEDTIME, Disp: 30 capsule, Rfl: 1   hydrALAZINE (APRESOLINE) 25 MG tablet, TAKE 2 TABLETS (50 MG TOTAL) BY MOUTH 3 (THREE) TIMES DAILY., Disp: 270  tablet, Rfl: 3   HYDROcodone-acetaminophen (NORCO/VICODIN) 5-325 MG tablet, Take 1 tablet by mouth every 4 (four) hours as needed for moderate pain (pain score 4-6) or severe pain (pain score 7-10)., Disp: 30 tablet, Rfl: 0   losartan (COZAAR) 50 MG tablet, Take 50 mg by mouth daily., Disp: , Rfl:    metFORMIN (GLUCOPHAGE) 500 MG tablet, TAKE 1 TABLET BY MOUTH EVERY DAY WITH BREAKFAST, Disp: 90 tablet, Rfl: 0   Naphazoline HCl (CLEAR EYES OP), Place 2 drops into both eyes daily as needed (irritation)., Disp: , Rfl:    ONETOUCH ULTRA test strip, 1 each by Other route 2 (two) times daily. as directed, Disp: 100 each, Rfl: 3   pantoprazole (PROTONIX) 40 MG tablet, TAKE 1 TABLET BY MOUTH EVERY DAY, Disp: 90 tablet, Rfl: 0   Rivaroxaban (XARELTO) 15 MG TABS tablet, Take 1 tablet (15 mg total) by mouth daily with supper., Disp: 30 tablet, Rfl: 8   spironolactone (ALDACTONE) 25 MG tablet, Take 1 tablet (25 mg total) by mouth daily., Disp: 30 tablet, Rfl: 11   torsemide (DEMADEX) 20 MG tablet, Take 1 tablet (20 mg total) by mouth daily., Disp: 90 tablet, Rfl: 3   Alcohol Swabs (ALCOHOL PADS) 70 % PADS, 1 Act by Does not apply route 2 (two) times daily. (Patient not taking: Reported on 09/07/2023), Disp: 100 each, Rfl: 3   Blood Glucose Monitoring Suppl (ONE TOUCH ULTRA 2) w/Device KIT, 1 Act by Does not apply route 2 (two) times daily. (Patient not taking: Reported on 09/07/2023), Disp: 1 kit, Rfl: 3   TRELEGY ELLIPTA 100-62.5-25 MCG/ACT AEPB, Inhale ONE PUFF into THE lungs daily (Patient not taking: Reported on 09/07/2023), Disp: 120 each, Rfl: 1 Allergies  Allergen Reactions   Bee Venom Anaphylaxis   Ivp Dye [Iodinated Contrast Media] Anaphylaxis   Ace Inhibitors Other (See Comments)    angioedema   Atenolol Other (See Comments)    severe headaches   Codeine Nausea And Vomiting   Entresto [Sacubitril-Valsartan] Other (See Comments)    Chest pain    Isosorbide Other (See Comments)    Severe headaches    Shrimp [Shellfish Allergy] Swelling   Simvastatin Other (See Comments)    not sure   Jardiance [Empagliflozin] Other (See Comments)    Caused Boils    Penicillin G Rash     Social History   Socioeconomic History   Marital status: Widowed    Spouse name: Not on file   Number of children: 1   Years of education: 9   Highest education level: Not on file  Occupational History   Occupation: RETIRED [employer]  Tobacco Use   Smoking status: Some Days    Current packs/day: 0.00    Average packs/day: 0.5 packs/day for 56.0 years (28.0 ttl pk-yrs)    Types: Cigarettes    Start date: 11/13/1965    Last attempt to quit: 11/13/2021    Years since quitting: 1.8  Smokeless tobacco: Never   Tobacco comments:    smoking 2 cigarettes/day as of 10/23/20  Vaping Use   Vaping status: Never Used  Substance and Sexual Activity   Alcohol use: No    Alcohol/week: 0.0 standard drinks of alcohol   Drug use: No   Sexual activity: Not Currently  Other Topics Concern   Not on file  Social History Narrative   Patient reports it being difficult to pay for everything due to outstanding medical bills from hospital stay last year but states she is able to keep up with basic expenses.  Patient does have some concerns with her house's condition due to a water leak- had her roof repaired last month but has some leaking in the front which has affected her porch.  Patient owns her own car and is able to drive herself but has some concerns about the reliability of her car- has gotten taxi to the clinic for appointment in the past when her car broke down.   Social Drivers of Corporate investment banker Strain: Low Risk  (07/12/2023)   Overall Financial Resource Strain (CARDIA)    Difficulty of Paying Living Expenses: Not hard at all  Food Insecurity: No Food Insecurity (07/03/2023)   Hunger Vital Sign    Worried About Running Out of Food in the Last Year: Never true    Ran Out of Food in the Last  Year: Never true  Transportation Needs: No Transportation Needs (07/03/2023)   PRAPARE - Administrator, Civil Service (Medical): No    Lack of Transportation (Non-Medical): No  Physical Activity: Inactive (07/12/2023)   Exercise Vital Sign    Days of Exercise per Week: 0 days    Minutes of Exercise per Session: 0 min  Stress: Stress Concern Present (07/12/2023)   [employer] of Occupational Health - Occupational Stress Questionnaire    Feeling of Stress : Very much  Social Connections: Not on file  Intimate Partner Violence: Not At Risk (07/03/2023)   Humiliation, Afraid, Rape, and Kick questionnaire    Fear of Current or Ex-Partner: No    Emotionally Abused: No    Physically Abused: No    Sexually Abused: No    Physical Exam      Future Appointments  Date Time Provider Department Center  10/11/2023  1:30 PM [facility] PA/NP [facility] None  10/17/2023  7:00 AM [facility] DEVICE REMOTES [facility]  01/16/2024  7:00 AM [facility] DEVICE REMOTES [facility]  04/16/2024  7:30 AM [facility] DEVICE REMOTES [facility]  07/16/2024  7:00 AM [facility] DEVICE REMOTES [facility]  10/15/2024  7:00 AM [facility] DEVICE REMOTES [facility]  01/14/2025  7:00 AM [facility] DEVICE REMOTES [facility]

## 2023-09-19 ENCOUNTER — Telehealth (HOSPITAL_COMMUNITY): Payer: Self-pay | Admitting: Emergency Medicine

## 2023-09-19 NOTE — Telephone Encounter (Signed)
Spoke with Ms. Kievit to come by for visit and she advised it will have to be tomorrow.  Visit w/ discharge scheduled tomorrow around 4:15.    [name], EMT-Paramedic [phone] 09/19/2023

## 2023-09-20 ENCOUNTER — Other Ambulatory Visit (HOSPITAL_COMMUNITY): Payer: Self-pay | Admitting: Emergency Medicine

## 2023-09-20 NOTE — Progress Notes (Signed)
 Patient is now discharged from [facility].  Patient has/has not met the following goals:  Yes :Patient expresses basic understanding of medications and what they are for Yes :Patient able to verbalize heart failure specific dietary/fluid restrictions Yes :Patient is aware of who to call if they have medical concerns or if they need to schedule or change appts Yes :Patient has a scale for daily weights and weighs regularly Yes :Patient able to verbalize concerning symptoms when they should call the HF clinic (weight gain ranges, etc) Yes :Patient has a PCP and has seen within the past year or has upcoming appt Yes :Patient has reliable access to getting their medications Yes :Patient has shown they are able to reorder medications reliably No :Patient has had admission in past 30 days- if yes how many? No :Patient has had admission in past 90 days- if yes how many?  Discharge Comments: Pt discharged at recommendation of Greig Mosses, NP due to inconsistent visits by paramedicine.  Pt suffered a mechanical fall in November which required surgery to repair her foot.  Since then patient has not been agreeable for visits on a regular basis.   Pt is having issues affording her medications due to >$500 deductable.  I will refer her to the patient advocates at the clinic for assistance with this so that she will be able to get her meds, primarily Xarelto . Reviewed with Ms. Theurer pending appointments and made sure she has appropriate phone numbers for the clinic should she have questions or need assistance.

## 2023-09-22 ENCOUNTER — Other Ambulatory Visit: Payer: Self-pay | Admitting: Internal Medicine

## 2023-09-22 ENCOUNTER — Other Ambulatory Visit (HOSPITAL_COMMUNITY): Payer: Self-pay | Admitting: Internal Medicine

## 2023-09-22 DIAGNOSIS — I5042 Chronic combined systolic (congestive) and diastolic (congestive) heart failure: Secondary | ICD-10-CM

## 2023-09-22 NOTE — Telephone Encounter (Signed)
 Spoke with patient about Medicare Prescription Payment Program for 2025 at paramedicine request. Provided general information about calling to set up payments if patient would prefer.

## 2023-09-26 NOTE — Telephone Encounter (Signed)
  [facility] Discharge 09/20/23  Discussed with HF Paramedic and HF Team.   Goals achieved.Can refer again if new needs identified.   Stable for discharge. Patient aware.   Dareon Nunziato NP-C  10:21 AM

## 2023-10-10 ENCOUNTER — Telehealth (HOSPITAL_COMMUNITY): Payer: Self-pay

## 2023-10-10 DIAGNOSIS — S82841A Displaced bimalleolar fracture of right lower leg, initial encounter for closed fracture: Secondary | ICD-10-CM | POA: Diagnosis not present

## 2023-10-10 NOTE — Telephone Encounter (Signed)
 Called to confirm/remind patient of their appointment at the [facility] on 10/11/23.   Patient reminded to bring all medications and/or complete list.  Confirmed patient has transportation. Gave directions, instructed to utilize valet parking.  Confirmed appointment prior to ending call.

## 2023-10-11 ENCOUNTER — Telehealth (HOSPITAL_COMMUNITY): Payer: Self-pay | Admitting: Pharmacy Technician

## 2023-10-11 ENCOUNTER — Encounter (HOSPITAL_COMMUNITY): Payer: Self-pay

## 2023-10-11 ENCOUNTER — Ambulatory Visit (HOSPITAL_COMMUNITY)
Admission: RE | Admit: 2023-10-11 | Discharge: 2023-10-11 | Disposition: A | Discharge status: Home / Self Care | Payer: Medicare Other | Source: Ambulatory Visit | Attending: Family Medicine | Admitting: Family Medicine

## 2023-10-11 ENCOUNTER — Other Ambulatory Visit (HOSPITAL_COMMUNITY): Payer: Self-pay

## 2023-10-11 VITALS — BP 142/78 | HR 66 | Resp 14

## 2023-10-11 DIAGNOSIS — E1122 Type 2 diabetes mellitus with diabetic chronic kidney disease: Secondary | ICD-10-CM | POA: Insufficient documentation

## 2023-10-11 DIAGNOSIS — I130 Hypertensive heart and chronic kidney disease with heart failure and stage 1 through stage 4 chronic kidney disease, or unspecified chronic kidney disease: Secondary | ICD-10-CM | POA: Insufficient documentation

## 2023-10-11 DIAGNOSIS — I493 Ventricular premature depolarization: Secondary | ICD-10-CM

## 2023-10-11 DIAGNOSIS — I5022 Chronic systolic (congestive) heart failure: Secondary | ICD-10-CM

## 2023-10-11 DIAGNOSIS — I10 Essential (primary) hypertension: Secondary | ICD-10-CM | POA: Diagnosis not present

## 2023-10-11 DIAGNOSIS — N183 Chronic kidney disease, stage 3 unspecified: Secondary | ICD-10-CM | POA: Diagnosis not present

## 2023-10-11 DIAGNOSIS — Z7901 Long term (current) use of anticoagulants: Secondary | ICD-10-CM | POA: Diagnosis not present

## 2023-10-11 DIAGNOSIS — J4489 Other specified chronic obstructive pulmonary disease: Secondary | ICD-10-CM | POA: Insufficient documentation

## 2023-10-11 DIAGNOSIS — Z951 Presence of aortocoronary bypass graft: Secondary | ICD-10-CM | POA: Diagnosis not present

## 2023-10-11 DIAGNOSIS — Z950 Presence of cardiac pacemaker: Secondary | ICD-10-CM | POA: Diagnosis not present

## 2023-10-11 DIAGNOSIS — Z720 Tobacco use: Secondary | ICD-10-CM | POA: Diagnosis not present

## 2023-10-11 DIAGNOSIS — Z79899 Other long term (current) drug therapy: Secondary | ICD-10-CM | POA: Insufficient documentation

## 2023-10-11 DIAGNOSIS — F17210 Nicotine dependence, cigarettes, uncomplicated: Secondary | ICD-10-CM | POA: Insufficient documentation

## 2023-10-11 DIAGNOSIS — I2510 Atherosclerotic heart disease of native coronary artery without angina pectoris: Secondary | ICD-10-CM

## 2023-10-11 DIAGNOSIS — I4821 Permanent atrial fibrillation: Secondary | ICD-10-CM | POA: Diagnosis not present

## 2023-10-11 DIAGNOSIS — I340 Nonrheumatic mitral (valve) insufficiency: Secondary | ICD-10-CM

## 2023-10-11 DIAGNOSIS — I4819 Other persistent atrial fibrillation: Secondary | ICD-10-CM | POA: Diagnosis not present

## 2023-10-11 DIAGNOSIS — N1832 Chronic kidney disease, stage 3b: Secondary | ICD-10-CM | POA: Diagnosis not present

## 2023-10-11 LAB — CBC
HCT: 46.1 % — ABNORMAL HIGH (ref 36.0–46.0)
Hemoglobin: 15.5 g/dL — ABNORMAL HIGH (ref 12.0–15.0)
MCH: 30.2 pg (ref 26.0–34.0)
MCHC: 33.6 g/dL (ref 30.0–36.0)
MCV: 89.9 fL (ref 80.0–100.0)
Platelets: 103 10*3/uL — ABNORMAL LOW (ref 150–400)
RBC: 5.13 MIL/uL — ABNORMAL HIGH (ref 3.87–5.11)
RDW: 16.1 % — ABNORMAL HIGH (ref 11.5–15.5)
WBC: 6.2 10*3/uL (ref 4.0–10.5)
nRBC: 0 % (ref 0.0–0.2)

## 2023-10-11 LAB — BASIC METABOLIC PANEL
Anion gap: 8 (ref 5–15)
BUN: 22 mg/dL (ref 8–23)
CO2: 20 mmol/L — ABNORMAL LOW (ref 22–32)
Calcium: 9.1 mg/dL (ref 8.9–10.3)
Chloride: 112 mmol/L — ABNORMAL HIGH (ref 98–111)
Creatinine, Ser: 1.07 mg/dL — ABNORMAL HIGH (ref 0.44–1.00)
GFR, Estimated: 53 mL/min — ABNORMAL LOW (ref >60)
Glucose, Bld: 111 mg/dL — ABNORMAL HIGH (ref 70–99)
Potassium: 3.8 mmol/L (ref 3.5–5.1)
Sodium: 140 mmol/L (ref 135–145)

## 2023-10-11 LAB — BRAIN NATRIURETIC PEPTIDE: B Natriuretic Peptide: 276.2 pg/mL — ABNORMAL HIGH (ref 0.0–100.0)

## 2023-10-11 MED ORDER — LOSARTAN POTASSIUM 100 MG PO TABS: 100.0000 mg | ORAL_TABLET | Freq: Every day | ORAL | 3 refills | Status: DC

## 2023-10-11 NOTE — Telephone Encounter (Signed)
 Pharmacy Patient Advocate Encounter  Insurance verification completed.   The patient is insured through  [facility] .  Ran test claim for Xarelto. Currently a quantity of 30 is a 30 day supply and the co-pay is $599. Patient would need to spend 4% OOP in order to apply for assistance.  Patient may be eligible for a Medicare prescription Payment plan. The patient will need to reach out to their insurance company to enrol in the payment plan to spread out their payments throughout the year, If available. Patient was advised of this while in office.  This test claim was processed through [facility]- copay amounts may vary at other pharmacies due to pharmacy/plan contracts, or as the patient moves through the different stages of their insurance plan.   Archer Asa, CPhT

## 2023-10-11 NOTE — Progress Notes (Addendum)
 Advanced [facility] Failure Clinic Note  PCP: [name], MD Primary Cardiologist: Dr. Anne Fu  EP: Dr Ladona Ridgel HF MD: Dr Gala Romney   HPI: [name] [name] is a 78 y.o. female with h/o HTN, former tobacco abuse, permanent AF, CAD s/p CABG x 2 (LIMA to LAD and SVG to OM) in 2016, Chronic combined HF, Ischemic CM, HLD, and Biotronik PPM.   She was initially noted to be in atrial fibrillation in 2016 with a reduced LVEF, leading to ischemic workup with a cardiac cath showing severe multivessel CAD with LM dz -> CABG x2 utilizing LIMA to LAD, SVG to OM.   Admitted 1/19 for AF with RVR in setting of Influenza A. EP was consulted to discuss plans for Tikosyn vs. ablation vs. medication rate control however, due to her known medication noncompliance she was found to not be an ablation nor antiarrhythmic candidate   2019 back in Afib. She was started on amiodarone & Entresto.  She underwent successful atrial flutter TEE/DCCV on 12/25/17.  Later went back in A fib.   Admitted 3/21 for iron-def anemia. Hgb 5.4 EGD/colonoscopy which were unremarkable and capsule endoscopy which showed duodenal ulcer with bleeding and stigmata.  GI recommended oral Protonix and outpatient follow-up.  Admitted 6/22 for AV node ablation and PPM due to uncontrolled heart rates with A fib. Underwent Biotronix single lead PPM. Toprol XL and digoxin stopped. Carvedilol 25 mg bid started. She was discharged 01/19/21.    Admitted 02/10/21 with a/c systolic heart failure. Diuresed with IV lasix and transitioned to torsemide 40 mg daily. EF 25-30% with severe MR/TR.    Admitted 10/22 for COPD exacerbation with a component of A/C CHF, ran out of torsemide several days prior  Echo 07/30/21: EF 25-30% Moderate RV dysfunction. Severe biatrial enlargement. Severe central MR/TR.   Was seen at [facility] in 4/23 for second opinion. Restarted Entresto 24/26 bid. Did not tolerate due to CP. Agreed she might candidate for  mTEER down the road.   She was seen in the ED  June, July, August, and twice in September 2023 for chest pain/shortness of breath.    Followed by EP for device upgrade. She was hesitant to pursue any procedures. Possible CRT discussed.   Admitted 10/23 with a/c HF, worrisome for low output. Echo showed EF 20%, severe LV dysfunction, RV moderately down.  Diuresed with IV lasix and started on milrinone. RHC showed low volume and CI 1.9 on milrinone, improved with small IVF bolus. Drips weaned and GDMT added back. BiDil increased with elevated BP. Referred back to Paramedicine to help with meds. Discharged home, weight 183 lbs.  EP attempted to upgrade her PPM to ICD on 04/08 but she was found to have occlusion of left subclavian vein and procedure was aborted. Saw Dr. Ladona Ridgel 04/25 and offered lead extraction and insertion of BiV ICD with removal of pacing lead and insertion of CS lead, left bundle area lead and ICD lead. She was not interested. She appeared volume overloaded and Torsemide was increased.   Admitted 4/24 with a/c HF, 2/2 medical non-adherence. Diuresed with IV lasix. Echo showed EF 25%, RV moderately reduced, RVSP 55 mmHg, severe BAE, moderate MR. GDMT titrated, and she was discharged home, weight 171 lbs.  Echo 8/24: EF 30-35%   Admitted 11/24 with ankle fx, 2/2 to fall. Underwent ORIF.  Today she returns for  HF follow up. Overall feeling fine. Foot is swelling. Denies palpitations, abnormal bleeding, CP, dizziness, or PND/Orthopnea. Appetite ok. No fever or chills. She is not weighing at home.Taking all medications.  She has graduated from [employer]. Has PT for ankle, followed by Ortho. Smoking 1 cigarette/day. Had [facility] aide, has woman living with her who helps her greatly.  Cardiac Studies   - Echo 8/24: EF 30-35%   - Echo (4/24): EF 25%, RV moderately down, moderate MR  - RHC (10/23): on milrinone 0.25 RA = 1 RV = 27/1 PA = 32/13 (18) PCW = 2 Fick cardiac output/index =  5.3/2.7 Thermo = 3.8/1.9 PVR = 3.0 (fick) 4.2 (TD) Ao sat = 93% PA sat = 71%, 68%   Given 175 cc NS load   PCW = 5 TD CO/CI = 4.1/2.1   1. Low volume status 2. With mild to moderately depressed CO on milrinone 3. Mild improvement in hemodynamics with volume loading    - Echo (10/23): EF 20%, severe LV dysfunction, RV moderately down - Echo (4/22): EF 25-30% RV moderately reduced  - Echo (11/21): EF 30-35% RV normal  - Echo (4/21): EF 55-60%  - Stress Test (1/20): Low Risk EF 44% - Echo (12/19): EF 30-35% RV mildly dilated - Echo (5/18): EF stable 50-55% - Echo (3/16): EF 35-40%.  ROS: All systems negative except as listed in HPI, PMH and Problem List.  SH:  Social History   Socioeconomic History   Marital status: Widowed    Spouse name: Not on file   Number of children: 1   Years of education: 26   Highest education level: Not on file  Occupational History   Occupation: RETIRED [employer]  Tobacco Use   Smoking status: Some Days    Current packs/day: 0.00    Average packs/day: 0.5 packs/day for 56.0 years (28.0 ttl pk-yrs)    Types: Cigarettes    Start date: 11/13/1965    Last attempt to quit: 11/13/2021    Years since quitting: 1.9   Smokeless tobacco: Never   Tobacco comments:    smoking 2 cigarettes/day as of 10/23/20  Vaping Use   Vaping status: Never Used  Substance and Sexual Activity   Alcohol use: No    Alcohol/week: 0.0 standard drinks of alcohol   Drug use: No   Sexual activity: Not Currently  Other Topics Concern   Not on file  Social History Narrative   Patient reports it being difficult to pay for everything due to outstanding medical bills from hospital stay last year but states she is able to keep up with basic expenses.  Patient does have some concerns with her house's condition due to a water leak- had her roof repaired last month but has some leaking in the front which has affected her porch.  Patient owns her own car and is able to  drive herself but has some concerns about the reliability of her car- has gotten taxi to the clinic for appointment in the past when her car broke down.   Social Drivers of Corporate investment banker Strain: Low Risk  (07/12/2023)   Overall Financial Resource Strain (CARDIA)    Difficulty of Paying Living Expenses: Not hard at all  Food Insecurity: No Food Insecurity (07/03/2023)   Hunger Vital Sign    Worried About Running Out of Food in the Last Year: Never true    Ran Out of Food in the Last Year: Never true  Transportation Needs: No  Transportation Needs (07/03/2023)   PRAPARE - Administrator, Civil Service (Medical): No    Lack of Transportation (Non-Medical): No  Physical Activity: Inactive (07/12/2023)   Exercise Vital Sign    Days of Exercise per Week: 0 days    Minutes of Exercise per Session: 0 min  Stress: Stress Concern Present (07/12/2023)   [employer] of Occupational Health - Occupational Stress Questionnaire    Feeling of Stress : Very much  Social Connections: Not on file  Intimate Partner Violence: Not At Risk (07/03/2023)   Humiliation, Afraid, Rape, and Kick questionnaire    Fear of Current or Ex-Partner: No    Emotionally Abused: No    Physically Abused: No    Sexually Abused: No   FH:  Family History  Problem Relation Age of Onset   Cancer Mother        LYMPHOMA   Heart disease Father    Other Father        TB   CVA Sister    Prostate cancer Brother 58   Diabetes Brother    Past Medical History:  Diagnosis Date   (HFimpEF) heart failure with improved ejection fraction (HCC)    a. 07/2018 Echo: EF 30-35%; b. 11/2019 Echo: EF 55-60%, no rwma, Gr2 DD, Nl RV size/fxn. Mild BAE. Mild MR/AI.   Arthritis    Asthma    Atopic dermatitis    CAD (coronary artery disease)    a. 2016 s/p CABG x 2 (LIMA->LAD, VG->OM); b. 08/2018 MV: EF 44%, no ischemia/infact.   Cardiac arrest (HCC) 10/2014   Cardiomyopathy, ischemic    a. 07/2018 Echo: EF  30-35%; 11/2019 Echo: EF 55-60%.   Carotid arterial disease (HCC)    a. 11/2019 Carotid U/S   CHF (congestive heart failure) (HCC)    CKD (chronic kidney disease), stage III (HCC)    COPD (chronic obstructive pulmonary disease) (HCC)    Diabetes mellitus without complication (HCC)    Dysrhythmia    Esophageal dilatation 2013   GERD (gastroesophageal reflux disease)    Glaucoma    Gout    Headache    Hearing loss    History of blood transfusion    Hypertension    Hypokalemia    Idiopathic angioedema    LGI bleed 08/06/2017   a. felt to be hemorrhoidal during that admission (no drop in Hgb).   Lower back pain    Paroxysmal atrial fibrillation (HCC)    a. Dx 2016-->h/o difficult to control rates (complicated by noncompliance), not felt to be a candidate for ablation or antiarrhythmic due to noncompliance; b. Recurrent AF 2021 - converted w/ IV dilt; c. CHA2DS2VASc = 7-->Xarelto.   Personal history of noncompliance with medical treatment, presenting hazards to health    Pneumonia    Prediabetes    Presence of permanent cardiac pacemaker    Right leg numbness    S/P CABG x 2 with clipping of LA appendage 11/14/2014   LIMA to LAD, SVG to OM, EVH via right thigh   Urine incontinence    Uterine fibroid    Current Outpatient Medications  Medication Sig Dispense Refill   acetaminophen (TYLENOL) 500 MG tablet Take 500 mg by mouth as needed for mild pain (pain score 1-3) or moderate pain (pain score 4-6).     Alcohol Swabs (ALCOHOL PADS) 70 % PADS 1 Act by Does not apply route 2 (two) times daily. 100 each 3   allopurinol (ZYLOPRIM) 100 MG tablet Take 1  tablet (100 mg total) by mouth daily. 30 tablet 6   Aromatic Inhalants (VICKS VAPOR INHALER IN) Inhale into the lungs as needed.     atorvastatin (LIPITOR) 10 MG tablet TAKE 1 TABLET BY MOUTH EVERYDAY AT BEDTIME 90 tablet 0   Blood Glucose Monitoring Suppl (ONE TOUCH ULTRA 2) w/Device KIT 1 Act by Does not apply route 2 (two) times daily. 1  kit 3   gabapentin (NEURONTIN) 100 MG capsule TAKE 1 CAPSULE BY MOUTH EVERYDAY AT BEDTIME 30 capsule 1   hydrALAZINE (APRESOLINE) 25 MG tablet TAKE 2 TABLETS (50 MG TOTAL) BY MOUTH 3 (THREE) TIMES DAILY. 540 tablet 1   losartan (COZAAR) 50 MG tablet Take 50 mg by mouth daily.     metFORMIN (GLUCOPHAGE) 500 MG tablet TAKE 1 TABLET BY MOUTH EVERY DAY WITH BREAKFAST 90 tablet 0   Naphazoline HCl (CLEAR EYES OP) Place 2 drops into both eyes daily as needed (irritation).     ONETOUCH ULTRA test strip 1 each by Other route 2 (two) times daily. as directed 100 each 3   pantoprazole (PROTONIX) 40 MG tablet TAKE 1 TABLET BY MOUTH EVERY DAY 90 tablet 0   Rivaroxaban (XARELTO) 15 MG TABS tablet Take 1 tablet (15 mg total) by mouth daily with supper. 30 tablet 8   spironolactone (ALDACTONE) 25 MG tablet Take 1 tablet (25 mg total) by mouth daily. 30 tablet 11   torsemide (DEMADEX) 20 MG tablet Take 1 tablet (20 mg total) by mouth daily. 90 tablet 3   HYDROcodone-acetaminophen (NORCO/VICODIN) 5-325 MG tablet Take 1 tablet by mouth every 4 (four) hours as needed for moderate pain (pain score 4-6) or severe pain (pain score 7-10). (Patient not taking: Reported on 10/11/2023) 30 tablet 0   TRELEGY ELLIPTA 100-62.5-25 MCG/ACT AEPB Inhale ONE PUFF into THE lungs daily (Patient not taking: No sig reported) 120 each 1   No current facility-administered medications for this encounter.   BP (!) 142/78 (BP Location: Left Arm, Patient Position: Sitting)   Pulse 66   Resp 14   SpO2 97%   Wt Readings from Last 3 Encounters:  06/26/23 85.3 kg (188 lb 0.8 oz)  05/12/23 78.7 kg (173 lb 6.4 oz)  04/28/23 78.5 kg (173 lb)   PHYSICAL EXAM: General:  NAD. No resp difficulty, arrived in [facility] HEENT: Normal Neck: Supple. No JVD. Cor: Regular rate & rhythm. No rubs, gallops or murmurs. Lungs: Clear, diminished in bases Abdomen: Soft, nontender, nondistended.  Extremities: No cyanosis, clubbing, rash, edema; R foot in ortho  boot Neuro: Alert & oriented x 3, moves all 4 extremities w/o difficulty. Affect pleasant.  ECG (personally reviewed): V paced 61 bpm  ASSESSMENT & PLAN: 1. Chronic Systolic Heart Failure - Suspect Combined ICM /NICM with previous A fib RVR. RV Pacing? - In 2021 EF improved 55-60% but over the last year EF has been down 25-30 % with severe MR/TR - Echo (10/23): EF 20%, moderate LVH, RV moderately reduced, RVSP 46 mmHg, moderate BAE, mild to moderate MR, moderate TR, - Echo (4/24): EF 25%, RV moderately reduced, RVSP 55 mmHg, severe BAE, moderate MR - Echo 8/24: EF 30-35% severe BAE, moderate MR - NYHA II-early III, volume ok.  - Continue torsemide 20 mg daily. - Continue spironolactone 25 mg daily. - Increase losartan 50 mg daily -> 100 mg daily (failed Entresto with chest pain).  - Continue hydralazine 50 mg tid, refuses Imdur d/t headaches. - No SGLT2i due UTI/yeast infections.  - No beta  blocker with recent acute exacerbation and high burden of RV pacing on device checks (61% last check 3/24) - Would likely benfit from upgrade to CRT. Upgrade PPM to ICD aborted 8/24 d/t occlusion of left subclavian vein. Dr. Ladona Ridgel saw patient and offered lead extraction and insertion of CRT, which she declined. - I think we have her as good as we can get her. Her care has been difficult due to limited insight into her HF and refusal of many suggestions.  - Labs today, repeat BMET in 10-14 days  2. MR, previously Severe - Moderate on echo 8/24 - Follow   3. Permanent A fib  - Biotronik dual chamber PPM/AV nodal ablation 2022.  - See above regarding device - Rate controlled on ECG today. - Continue Xarelto 15 mg daily (adjusted for CrCl). No bleeding issues. Given samples today - Labs today   4. CAD - s/p CABG x2 2016 (LIMA to LAD and SVG to OM)  - No s/s angina  - No ASA given AC - Continue statin.   5. COPD/tobacco abuse - Her sister recently died, starting smoking more - Discussed  smoking cessation.   6. Hx PVCs - During prior admit, on milrinone. - Asymptomatic. - None on ECG today - Labs today.  7. CKD IIIb - Baseline SCr 1.2-1.4 - Labs today  8. Hypertension - BP controlled - Meds as above  Follow up in 3 months with Dr. Gala Romney.   Jacklynn Ganong, FNP  2:14 PM 10/11/23

## 2023-10-11 NOTE — Patient Instructions (Addendum)
 Thank you for coming in today  If you had labs drawn today, any labs that are abnormal the clinic will call you No news is good news  Medications: Increase Losartan to 100 mg 1 tablet daily You were given Xarelto samples today  Follow up appointments:  Your physician recommends that you schedule a follow-up appointment in:  3 months With Dr. Gala Romney  You will receive a reminder letter in the mail a few months in advance. If you don't receive a letter, please call our office to schedule the follow-up appointment.    Do the following things EVERYDAY: Weigh yourself in the morning before breakfast. Write it down and keep it in a log. Take your medicines as prescribed Eat low salt foods--Limit salt (sodium) to 2000 mg per day.  Stay as active as you can everyday Limit all fluids for the day to less than 2 liters   At the [facility], you and your health needs are our priority. As part of our continuing mission to provide you with exceptional heart care, we have created designated Provider Care Teams. These Care Teams include your primary Cardiologist (physician) and Advanced Practice Providers (APPs- Physician Assistants and Nurse Practitioners) who all work together to provide you with the care you need, when you need it.   You may see any of the following providers on your designated Care Team at your next follow up: Dr Arvilla Meres Dr Marca Ancona Dr. Marcos Eke, NP Robbie Lis, [place] [facility] [place], [place] Brynda Peon, NP Karle Plumber, PharmD   Please be sure to bring in all your medications bottles to every appointment.    Thank you for choosing [facility]  If you have any questions or concerns before your next appointment please send [place] a message through [place] or call our office at [phone].    TO LEAVE A MESSAGE FOR THE NURSE SELECT OPTION 2, PLEASE LEAVE A MESSAGE  INCLUDING: YOUR NAME DATE OF BIRTH CALL BACK NUMBER REASON FOR CALL**this is important as we prioritize the call backs  YOU WILL RECEIVE A CALL BACK THE SAME DAY AS LONG AS YOU CALL BEFORE 4:00 PM

## 2023-10-17 ENCOUNTER — Ambulatory Visit (INDEPENDENT_AMBULATORY_CARE_PROVIDER_SITE_OTHER): Payer: Self-pay

## 2023-10-17 DIAGNOSIS — I5022 Chronic systolic (congestive) heart failure: Secondary | ICD-10-CM

## 2023-10-17 DIAGNOSIS — I4819 Other persistent atrial fibrillation: Secondary | ICD-10-CM

## 2023-10-19 LAB — CUP PACEART REMOTE DEVICE CHECK
Battery Voltage: 75
Date Time Interrogation Session: 20250304142435
Implantable Lead Connection Status: 753985
Implantable Lead Implant Date: 20220606
Implantable Lead Location: 753860
Implantable Lead Model: 377169
Implantable Lead Serial Number: 8000396500
Implantable Pulse Generator Implant Date: 20220606
Pulse Gen Model: 407145
Pulse Gen Serial Number: 70105656

## 2023-10-25 ENCOUNTER — Other Ambulatory Visit (HOSPITAL_COMMUNITY): Payer: Medicare Other

## 2023-11-01 ENCOUNTER — Other Ambulatory Visit: Payer: Self-pay | Admitting: Podiatry

## 2023-11-01 ENCOUNTER — Ambulatory Visit (HOSPITAL_COMMUNITY)
Admission: RE | Admit: 2023-11-01 | Discharge: 2023-11-01 | Disposition: A | Discharge status: Home / Self Care | Source: Ambulatory Visit | Attending: Cardiology | Admitting: Cardiology

## 2023-11-01 DIAGNOSIS — I5022 Chronic systolic (congestive) heart failure: Secondary | ICD-10-CM | POA: Insufficient documentation

## 2023-11-01 LAB — BASIC METABOLIC PANEL
Anion gap: 8 (ref 5–15)
BUN: 20 mg/dL (ref 8–23)
CO2: 20 mmol/L — ABNORMAL LOW (ref 22–32)
Calcium: 9 mg/dL (ref 8.9–10.3)
Chloride: 112 mmol/L — ABNORMAL HIGH (ref 98–111)
Creatinine, Ser: 1.05 mg/dL — ABNORMAL HIGH (ref 0.44–1.00)
GFR, Estimated: 54 mL/min — ABNORMAL LOW (ref >60)
Glucose, Bld: 132 mg/dL — ABNORMAL HIGH (ref 70–99)
Potassium: 3.5 mmol/L (ref 3.5–5.1)
Sodium: 140 mmol/L (ref 135–145)

## 2023-11-20 NOTE — Addendum Note (Signed)
 Addended by: Geralyn Flash D on: 11/20/2023 05:09 PM   Modules accepted: Orders

## 2023-11-20 NOTE — Progress Notes (Signed)
 Remote pacemaker transmission.

## 2023-11-24 ENCOUNTER — Other Ambulatory Visit: Payer: Self-pay | Admitting: Internal Medicine

## 2023-11-27 ENCOUNTER — Other Ambulatory Visit (HOSPITAL_COMMUNITY): Payer: Self-pay | Admitting: Internal Medicine

## 2023-11-27 ENCOUNTER — Other Ambulatory Visit: Payer: Self-pay | Admitting: Internal Medicine

## 2023-11-27 DIAGNOSIS — I5022 Chronic systolic (congestive) heart failure: Secondary | ICD-10-CM

## 2023-11-27 DIAGNOSIS — I2510 Atherosclerotic heart disease of native coronary artery without angina pectoris: Secondary | ICD-10-CM

## 2023-12-06 ENCOUNTER — Other Ambulatory Visit: Payer: Self-pay | Admitting: Internal Medicine

## 2023-12-06 DIAGNOSIS — J411 Mucopurulent chronic bronchitis: Secondary | ICD-10-CM

## 2023-12-07 ENCOUNTER — Telehealth: Payer: Self-pay | Admitting: *Deleted

## 2023-12-07 DIAGNOSIS — S82841A Displaced bimalleolar fracture of right lower leg, initial encounter for closed fracture: Secondary | ICD-10-CM | POA: Diagnosis not present

## 2023-12-07 NOTE — Telephone Encounter (Signed)
   Pre-operative Risk Assessment    Patient Name: [name] [name]  DOB: 1946/01/29 MRN: [id number]   Date of last office visit: 10/11/2023 Date of next office visit: N/A   Request for Surgical Clearance    Procedure:   RIGHT ANKLE HARDWARE REMOVAL SYNDESMOSIS FIXATION WITH POSSIBLE SYNDESMOSIS REVISION   Date of Surgery:  Clearance TBD                                Surgeon:  DR. Ali Ink Surgeon's Group or Practice Name:  [facility] Phone number:  [phone] Fax number:  [phone]   Type of Clearance Requested:   - Medical  - Pharmacy:  Hold Rivaroxaban  (Xarelto ) NOT INDICATED   Type of Anesthesia:  General    Additional requests/questions:    Berenda Breaker   12/07/2023, 3:08 PM

## 2023-12-08 NOTE — Telephone Encounter (Signed)
 Left message for the patient to contact the office for an appointment.

## 2023-12-08 NOTE — Telephone Encounter (Signed)
   Name: [name] [name]  DOB: July 21, 1946  MRN: [id number]  Primary Cardiologist: Dr. Julane Ny of CHF service  Chart reviewed as part of pre-operative protocol coverage. Because of [name] [name] past medical history and time since last visit, she will require a follow-up in-office visit in order to better assess preoperative cardiovascular risk.  Pre-op covering staff: - Please schedule appointment and call patient to inform them. If patient already had an upcoming appointment within acceptable timeframe, please add "pre-op clearance" to the appointment notes so provider is aware. - Please contact requesting surgeon's office via preferred method (i.e, phone, fax) to inform them of need for appointment prior to surgery.  Please arrange CHF clinic follow up to decide on cardiac clearance  Ervin Heath, PA  12/08/2023, 2:56 PM

## 2023-12-08 NOTE — Telephone Encounter (Signed)
 Patient with diagnosis of afib on Xarelto  for anticoagulation.    Procedure: RIGHT ANKLE HARDWARE REMOVAL SYNDESMOSIS FIXATION WITH POSSIBLE SYNDESMOSIS REVISION  Date of procedure: TBD   CHA2DS2-VASc Score = 6   This indicates a 9.7% annual risk of stroke. [name] score is based upon: CHF History: 1 HTN History: 1 Diabetes History: 1 Stroke History: 0 Vascular Disease History: 0 Age Score: 2 Gender Score: 1      CrCl 48 ml/min Platelet count 103  Patient has not had an Afib/aflutter ablation within the last 3 months or DCCV within the last 30 days  Per office protocol, patient can hold Xarelto  for 2 days prior to procedure.    **This guidance is not considered finalized until pre-operative APP has relayed final recommendations.**

## 2023-12-12 ENCOUNTER — Other Ambulatory Visit: Payer: Self-pay | Admitting: Internal Medicine

## 2023-12-12 NOTE — Telephone Encounter (Signed)
 Copied from CRM [id number]. Topic: Clinical - Medication Refill >> Dec 12, 2023  9:34 AM Dimple Francis wrote: Most Recent Primary Care Visit:  Provider: Wellington Half  Department: [facility] [place]  Visit Type: OFFICE VISIT  Date: 07/21/2023  Medication: losartan  (COZAAR ) 100 MG tablet  Has the patient contacted their pharmacy? Yes (Agent: If no, request that the patient contact the pharmacy for the refill. If patient does not wish to contact the pharmacy document the reason why and proceed with request.) (Agent: If yes, when and what did the pharmacy advise?)  Is this the correct pharmacy for this prescription? Yes If no, delete pharmacy and type the correct one.  This is the patient's preferred pharmacy:  [id number] [place], [place] - [address] [place] [address] Phone: [phone] Fax: [phone]   Has the prescription been filled recently? Yes  Is the patient out of the medication? Yes  Has the patient been seen for an appointment in the last year OR does the patient have an upcoming appointment? Yes  Can we respond through MyChart? Yes  Agent: Please be advised that Rx refills may take up to 3 business days. We ask that you follow-up with your pharmacy.

## 2023-12-13 NOTE — Telephone Encounter (Signed)
 Not a patient at [facility] - refill request sent to the wrong office.

## 2023-12-19 ENCOUNTER — Telehealth: Payer: Self-pay | Admitting: Internal Medicine

## 2023-12-19 ENCOUNTER — Other Ambulatory Visit: Payer: Self-pay | Admitting: Internal Medicine

## 2023-12-19 ENCOUNTER — Other Ambulatory Visit (HOSPITAL_COMMUNITY): Payer: Self-pay | Admitting: Internal Medicine

## 2023-12-19 NOTE — Telephone Encounter (Signed)
 Copied from CRM [id number]. Topic: Clinical - Medical Advice >> Dec 19, 2023 12:38 PM Allyne Areola wrote: Reason for CRM: Patient is calling to see if she is due for her pneumonia shot. [facility] wanted the patient to confirm with her primary care provider.

## 2023-12-19 NOTE — Telephone Encounter (Signed)
 Copied from CRM [id number]. Topic: Clinical - Medication Refill >> Dec 12, 2023  9:34 AM Dimple Francis wrote: Most Recent Primary Care Visit:  Provider: Wellington Half  Department: [facility]  Visit Type: OFFICE VISIT  Date: 07/21/2023  Medication: losartan  (COZAAR ) 100 MG tablet  Has the patient contacted their pharmacy? Yes (Agent: If no, request that the patient contact the pharmacy for the refill. If patient does not wish to contact the pharmacy document the reason why and proceed with request.) (Agent: If yes, when and what did the pharmacy advise?)  Is this the correct pharmacy for this prescription? Yes If no, delete pharmacy and type the correct one.  This is the patient's preferred pharmacy:  [id number] [place], [place] - [address] AT [facility] [address] [facility] [address] [place] [address] Phone: [phone] Fax: [phone]   Has the prescription been filled recently? Yes  Is the patient out of the medication? Yes  Has the patient been seen for an appointment in the last year OR does the patient have an upcoming appointment? Yes  Can we respond through MyChart? Yes  Agent: Please be advised that Rx refills may take up to 3 business days. We ask that you follow-up with your pharmacy. >> Dec 19, 2023 12:35 PM Allyne Areola wrote: Patient is calling to follow up on this medication refill, she followed up with the pharmacy and they have not received the prescription. She also states the pharmacy submitted a refill request as well but no response.

## 2023-12-20 NOTE — Telephone Encounter (Signed)
 Patient seen you in December can you refill this medication for her. She states that Dr. Julane Ny incresed her to taking 2 tablets daily but that's not what her prescription reads. [facility] advised and [facility] [address]

## 2023-12-21 NOTE — Telephone Encounter (Signed)
**Note De-identified  [place] Obfuscation** Please advise 

## 2023-12-22 NOTE — Telephone Encounter (Signed)
Patient gave a verbal understanding.

## 2023-12-26 NOTE — Telephone Encounter (Signed)
 Pt scheduled to see Charles Connor, NP, 01/02/24.  Clearance will be addressed at that time.  Will route to the requesting surgeon's office to make them aware.

## 2023-12-29 DIAGNOSIS — M25571 Pain in right ankle and joints of right foot: Secondary | ICD-10-CM | POA: Diagnosis not present

## 2023-12-29 DIAGNOSIS — M25671 Stiffness of right ankle, not elsewhere classified: Secondary | ICD-10-CM | POA: Diagnosis not present

## 2024-01-01 NOTE — Progress Notes (Signed)
 Cardiology Office Note    Patient Name: [name] [name] Date of Encounter: 01/01/2024  Primary Care Provider:  Arcadio Knuckles, MD Primary Cardiologist:  Dorothye Gathers, MD Primary Electrophysiologist: Manya Sells, MD   Past Medical History    Past Medical History:  Diagnosis Date   (HFimpEF) heart failure with improved ejection fraction (HCC)    a. 07/2018 Echo: EF 30-35%; b. 11/2019 Echo: EF 55-60%, no rwma, Gr2 DD, Nl RV size/fxn. Mild BAE. Mild MR/AI.   Arthritis    Asthma    Atopic dermatitis    CAD (coronary artery disease)    a. 2016 s/p CABG x 2 (LIMA->LAD, VG->OM); b. 08/2018 MV: EF 44%, no ischemia/infact.   Cardiac arrest (HCC) 10/2014   Cardiomyopathy, ischemic    a. 07/2018 Echo: EF 30-35%; 11/2019 Echo: EF 55-60%.   Carotid arterial disease (HCC)    a. 11/2019 Carotid U/S   CHF (congestive heart failure) (HCC)    CKD (chronic kidney disease), stage III (HCC)    COPD (chronic obstructive pulmonary disease) (HCC)    Diabetes mellitus without complication (HCC)    Dysrhythmia    Esophageal dilatation 2013   GERD (gastroesophageal reflux disease)    Glaucoma    Gout    Headache    Hearing loss    History of blood transfusion    Hypertension    Hypokalemia    Idiopathic angioedema    LGI bleed 08/06/2017   a. felt to be hemorrhoidal during that admission (no drop in Hgb).   Lower back pain    Paroxysmal atrial fibrillation (HCC)    a. Dx 2016-->h/o difficult to control rates (complicated by noncompliance), not felt to be a candidate for ablation or antiarrhythmic due to noncompliance; b. Recurrent AF 2021 - converted w/ IV dilt; c. CHA2DS2VASc = 7-->Xarelto .   Personal history of noncompliance with medical treatment, presenting hazards to health    Pneumonia    Prediabetes    Presence of permanent cardiac pacemaker    Right leg numbness    S/P CABG x 2 with clipping of LA appendage 11/14/2014   LIMA to LAD, SVG to OM, EVH via right thigh   Urine incontinence     Uterine fibroid     History of Present Illness  [name] [name] is a 78 y.o. female with a PMH of CAD s/pCABG x 2 (LIMA to LAD and SVG to OM) in 2016, persistent AF (on Xarelto ) s/p AV node ablation with Biotronik PPM placement, chronic combined CHF, ICM, CKD stage III, prediabetes, HTN, HLD, COPD, prior tobacco abuse who presents today for preoperative clearance.  [name] [name] was seen initially in 2016 for atrial fibrillation and found to have reduced EF leading to ischemic workup and left heart cath.  Cath results showed multivessel CAD and patient underwent CABG x 2 with LIMA to LAD and SVG to OM 1.  She was seen in 08/2017 with complaint of AF with RVR in the setting of influenza.  She was referred to EP and underwent discussion for either Tikosyn or ablation however due to her history of noncompliance she was found to be not a candidate for either therapies.  She was admitted on 10/2017 with recurrent AF with RVR and started on Cardizem  with 2D echo repeated showing decreased EF of 30-35% with diffuse hypokinesis.  She was treated with IV Lasix  and discharged on Cardizem  as well as digoxin  and Xarelto .  She underwent successful TEE/DCCV on 12/25/2017 but reverted back to AF.  She was admitted on 10/2019 iron deficiency anemia with hemoglobin of 5.4 and underwent upper and lower endoscopy as well as capsule endoscopy that showed duodenal ulcer.  She was started on Protonix  and advised to follow-up with gastroenterology.  She was admitted on 01/18/2021 and underwent AV node ablation and placement of Biotronik PPM with digoxin  discontinued.  She was started on carvedilol  which was discontinued due to headache and switch back to Toprol -XL.  She was admitted on 06/05/2021 for complaint of COPD acute CHF exacerbation.Echo 07/30/21: EF 25-30% Moderate RV dysfunction. Severe biatrial enlargement. Severe central MR/TR. she was evaluated at [facility] for second opinion for severe central MR/TR and was restarted on GDMT with  Entresto  however did not tolerate due to chest pain.  She was evaluated by EP for possible device upgrade but patient was hesitant to pursue this no procedure.  She was admitted on 06/06/2022 with complaint of shortness of breath and chest pain after running out of torsemide  for a few days.  She was given Lasix  40 mg x 1 and 2D echo was completed showing EF of 20% with severe LV dysfunction and RV moderately decreased.  She was started on milrinone  and underwent RHC that showed low volume and improvement with milrinone  and patient was referred to perimetazine for medication support at discharge.  She was seen by Dr. Carolynne [name] on 06/20/2022 and agreed with pursuing BiV ICD upgrade.  She underwent attempted procedure on 11/21/22 but was found to have occlusive left subclavian vein and procedure was aborted.  She was evaluated by Dr. Carolynne [name] on 12/08/2022 and offered lead extraction with insertion of BiV ICD removal of pacing lead but patient was not interested.  She was noted to be volume overloaded at visit and torsemide  was increased.  She was seen in the ED on 12/12/2022 after development of chest pain and abdominal pain.  She was found to have decompensated CHF with 2D echo completed showing EF of 25% and global hypokinesis and mild LVH with mild to moderate TR.  She was placed on IV Lasix  and received p.o. metolazone .  She was readmitted on 06/25/2023 for elective right ankle ORIF by Dr. Cherl Corner she underwent successful procedure and was discharged in stable condition.  She was last seen by [name] [name] in [place] FNP on 10/11/2023 reported feeling well follow-up.  She had graduated from [employer] [facility] and was undergoing PT for her ankle.  Losartan  increased to 50 mg and continued on torsemide , spironolactone  no SGLT2 or Entresto  due to intolerance.  [name] [name] presents today with her close friend for preoperative clearance and follow-up. She is seeking surgical clearance following a previous surgery where she had a  regional block due to her heart condition. She experiences discomfort at the device site but not under her breasts, and has no chest pain. She has increased shortness of breath since her sister's passing, which she attributes to increased cigarette smoking and deconditioning from prolonged bed rest. Several months ago, she sustained a fall resulting in a broken ankle in three places, leading to eight months of being bedridden. This has caused muscle and fat loss. Recently, she has started moving more with encouragement from a friend and is relearning to walk. She experiences pain in her right leg and swelling around her toes, but no x-ray has been performed on this leg. She can pivot and move with difficulty, especially when weight-bearing on the injured side. She is able to eat, bathe, and use the bathroom independently with a portable toilet beside  her bed. She can move around her house with and without assistance but cannot walk one or two blocks, climb stairs, or run. She can perform minimal housework like sweeping a small area but cannot vacuum or carry groceries. [name] [name] work is not possible. She experiences intermittent shooting pains in her head, which are relieved by Tylenol . No visual disturbances or stroke-like symptoms are present. She has a history of smoking, which has increased recently due to stress from her sister's death. She smokes about six cigarettes a day, including [place]  Slims, and is attempting to reduce her intake. Her sister, who was like a mother to her, passed away last month, which has been emotionally challenging. She was unable to attend the funeral due to her broken ankle and has been coping with the loss by smoking more cigarettes. She has not sought support from a pastor or counselor but has been in contact with her sister's church. Patient denies chest pain, palpitations, dyspnea, PND, orthopnea, nausea, vomiting, dizziness, syncope, edema, weight gain, or early  satiety.  Discussed the use of AI scribe software for clinical note transcription with the patient, who gave verbal consent to proceed.  History of Present Illness   Review of Systems  Please see the history of present illness.    All other systems reviewed and are otherwise negative except as noted above.  Physical Exam     Wt Readings from Last 3 Encounters:  06/26/23 188 lb 0.8 oz (85.3 kg)  05/12/23 173 lb 6.4 oz (78.7 kg)  04/28/23 173 lb (78.5 kg)   ZO:XWRUE were no vitals filed for this visit.,There is no height or weight on file to calculate BMI. GEN: Well nourished, well developed in no acute distress Neck: No JVD; No carotid bruits Pulmonary: Clear to auscultation without rales, wheezing or rhonchi  Cardiovascular: Normal rate. Regular rhythm. Normal S1. Normal S2.   Murmurs: There is no murmur.  ABDOMEN: Soft, non-tender, non-distended EXTREMITIES: Trace lower extremity edema  EKG/LABS/ Recent Cardiac Studies   ECG personally reviewed by me today - Atrial fibrillation with rate of 62 bpm and V-paced with no acute changes consistent with previous EKG.  Risk Assessment/Calculations:    CHA2DS2-VASc Score = 6   This indicates a 9.7% annual risk of stroke. [name] score is based upon: CHF History: 1 HTN History: 1 Diabetes History: 1 Stroke History: 0 Vascular Disease History: 0 Age Score: 2 Gender Score: 1         Lab Results  Component Value Date   WBC 6.2 10/11/2023   HGB 15.5 (H) 10/11/2023   HCT 46.1 (H) 10/11/2023   MCV 89.9 10/11/2023   PLT 103 (L) 10/11/2023   Lab Results  Component Value Date   CREATININE 1.05 (H) 11/01/2023   BUN 20 11/01/2023   NA 140 11/01/2023   K 3.5 11/01/2023   CL 112 (H) 11/01/2023   CO2 20 (L) 11/01/2023   Lab Results  Component Value Date   CHOL 109 03/16/2022   HDL 30.20 (L) 03/16/2022   LDLCALC 54 03/16/2022   TRIG 123.0 03/16/2022   CHOLHDL 4 03/16/2022    Lab Results  Component Value Date    HGBA1C 5.6 06/26/2023   Assessment & Plan    Assessment and Plan Assessment & Plan Shortness of breath New dyspnea likely due to increased smoking and deconditioning. No new chest pain or stroke-like symptoms. - Order echocardiogram to assess cardiac function. - Check laboratory work for renal function and complete blood count.  Surgical risk due to heart condition High surgical risk due to cardiac condition. Previous surgery used regional block and local anesthesia. Current plan to replicate anesthesia protocol. Dyspnea and new symptoms require further evaluation before surgical clearance. Informed consent discussed regarding anesthesia risks. - Order echocardiogram prior to surgical clearance. - Surgical clearance contingent on echocardiogram results. - Discuss anesthesia plan with surgical team to avoid general anesthesia.  Fracture of right ankle Trimalleolar fracture with muscle atrophy and deconditioning after eight months of bed rest. Limited daily activity ability. - Continue physical therapy to enhance strength and mobility. - Encourage chair exercises to maintain activity level.  Smoking Increased tobacco use post-sister's death, currently six cigarettes per day. Smoking cessation crucial for health and surgical risk reduction. - Encourage smoking cessation to improve health and reduce surgical risk.    1.  Preoperative clearance: - Patient's RCRI score is 11% -High surgical risk due to cardiac condition. Dyspnea and new symptoms require further evaluation before surgical clearance. - Order echocardiogram prior to surgical clearance. - Surgical clearance contingent on echocardiogram results. - Discuss anesthesia plan with surgical team to avoid general anesthesia. Patient's final clearance is pending updated 2D echo and evaluation by primary cardiologist in CHF clinic.  Patient can hold Xarelto  2 days prior to procedure and should restart postprocedure when surgically  safe and hemostasis is achieved  2.  Chronic HFrEF: 2D echo completed on 11/2022 with EF of 25-30% with severely decreased LV function and global hypokinesis of severe asymmetric LVH and severely enlarged RV with severely dilated LA and mild to moderate MVR with moderate calcification of the AVR -New dyspnea likely due to increased smoking and deconditioning. No new chest pain or stroke-like symptoms. - Order echocardiogram to assess cardiac function. - Check laboratory work for renal function and complete blood count. -Continue current GDMT with hydralazine  25 mg 3 times daily, losartan  100 mg, spironolactone  25 mg, torsemide  30 mg daily  3.  Permanent AF: - Patient's heart rate is controlled with V-paced noted on EKG - She is compliant with her current anticoagulation and denies any inappropriate bleeding - Continue Xarelto  15 mg daily -CHA2DS2-VASc Score = 6 [CHF History: 1, HTN History: 1, Diabetes History: 1, Stroke History: 0, Vascular Disease History: 0, Age Score: 2, Gender Score: 1].  Therefore, the patient's annual risk of stroke is 9.7 %.      4.  CAD: -s/pCABG x 2 (LIMA to LAD and SVG to OM) - Patient reports no chest pain or angina since previous follow-up. - Last ischemic evaluation completed by right heart catheterization on 05/2022 and Myoview  09/03/2018 - Continue Lipitor  10 mg daily   5.  Nonrheumatic severe MR: - TTE completed 2024 with mild to moderate MVR with moderate calcification of the AVR with previous echo showing mild to moderate - 2D echo to be repeated for further evaluation - Continue torsemide  30 mg daily  6.  History of COPD/tobacco abuse: -ncreased tobacco use post-sister's death, currently six cigarettes per day. Smoking cessation crucial for health and surgical risk reduction. - Encourage smoking cessation to improve health and reduce surgical risk.  7.  Essential hypertension: - Patient's BP today is stable at 128/78 - Continue hydralazine  25 mg 3  times daily, losartan  100 mg daily, spironolactone  25 mg daily  8.  CKD stage IIIb: - BMET to be completed today  Disposition: Follow-up with Dorothye Gathers, MD or APP in as scheduled    Signed, Francene Ing, Retha Cast, NP 01/01/2024, 6:47 PM [facility]

## 2024-01-02 ENCOUNTER — Encounter: Payer: Self-pay | Admitting: Nurse Practitioner

## 2024-01-02 ENCOUNTER — Telehealth (HOSPITAL_COMMUNITY): Payer: Self-pay | Admitting: Internal Medicine

## 2024-01-02 ENCOUNTER — Ambulatory Visit: Attending: Nurse Practitioner | Admitting: Nurse Practitioner

## 2024-01-02 VITALS — BP 128/78 | HR 60 | Ht 67.5 in | Wt 172.6 lb

## 2024-01-02 DIAGNOSIS — I10 Essential (primary) hypertension: Secondary | ICD-10-CM | POA: Diagnosis not present

## 2024-01-02 DIAGNOSIS — N183 Chronic kidney disease, stage 3 unspecified: Secondary | ICD-10-CM

## 2024-01-02 DIAGNOSIS — I5022 Chronic systolic (congestive) heart failure: Secondary | ICD-10-CM

## 2024-01-02 DIAGNOSIS — Z01810 Encounter for preprocedural cardiovascular examination: Secondary | ICD-10-CM

## 2024-01-02 DIAGNOSIS — I4819 Other persistent atrial fibrillation: Secondary | ICD-10-CM | POA: Diagnosis not present

## 2024-01-02 DIAGNOSIS — I340 Nonrheumatic mitral (valve) insufficiency: Secondary | ICD-10-CM

## 2024-01-02 DIAGNOSIS — I2510 Atherosclerotic heart disease of native coronary artery without angina pectoris: Secondary | ICD-10-CM

## 2024-01-02 DIAGNOSIS — Z720 Tobacco use: Secondary | ICD-10-CM | POA: Diagnosis not present

## 2024-01-02 NOTE — Patient Instructions (Signed)
 Medication Instructions:  Your physician recommends that you continue on your current medications as directed. Please refer to the Current Medication list given to you today. *If you need a refill on your cardiac medications before your next appointment, please call your pharmacy*  Lab Work: TODAY-BMET & CBC If you have labs (blood work) drawn today and your tests are completely normal, you will receive your results only by: MyChart Message (if you have MyChart) OR A paper copy in the mail If you have any lab test that is abnormal or we need to change your treatment, we will call you to review the results.  Testing/Procedures: Your physician has requested that you have an echocardiogram. Echocardiography is a painless test that uses sound waves to create images of your heart. It provides your doctor with information about the size and shape of your heart and how well your heart's chambers and valves are working. This procedure takes approximately one hour. There are no restrictions for this procedure. Please do NOT wear cologne, perfume, aftershave, or lotions (deodorant is allowed). Please arrive 15 minutes prior to your appointment time.  Please note: We ask at that you not bring children with you during ultrasound (echo/ vascular) testing. Due to room size and safety concerns, children are not allowed in the ultrasound rooms during exams. Our front office staff cannot provide observation of children in our lobby area while testing is being conducted. An adult accompanying a patient to their appointment will only be allowed in the ultrasound room at the discretion of the ultrasound technician under special circumstances. We apologize for any inconvenience.\  Follow-Up: At [facility], you and your health needs are our priority.  As part of our continuing mission to provide you with exceptional heart care, our providers are all part of one team.  This team includes your primary  Cardiologist (physician) and Advanced Practice Providers or APPs (Physician Assistants and Nurse Practitioners) who all work together to provide you with the care you need, when you need it.  Your next appointment:   1 month(s)  Provider:   Dr Lorayne Rocks  or APP for pre-op clearance   We recommend signing up for the patient portal called "MyChart".  Sign up information is provided on this After Visit Summary.  MyChart is used to connect with patients for Virtual Visits (Telemedicine).  Patients are able to view lab/test results, encounter notes, upcoming appointments, etc.  Non-urgent messages can be sent to your provider as well.   To learn more about what you can do with MyChart, go to [email or web address].   Other Instructions

## 2024-01-02 NOTE — Telephone Encounter (Signed)
 Per [facility] pt need surgical clearance, Dr Julane Ny is her provider

## 2024-01-03 ENCOUNTER — Other Ambulatory Visit (HOSPITAL_COMMUNITY): Payer: Self-pay | Admitting: Family Medicine

## 2024-01-03 ENCOUNTER — Ambulatory Visit: Payer: Self-pay | Admitting: Nurse Practitioner

## 2024-01-03 DIAGNOSIS — I4819 Other persistent atrial fibrillation: Secondary | ICD-10-CM

## 2024-01-03 LAB — BASIC METABOLIC PANEL WITH GFR
BUN/Creatinine Ratio: 19 (ref 12–28)
BUN: 21 mg/dL (ref 8–27)
CO2: 15 mmol/L — ABNORMAL LOW (ref 20–29)
Calcium: 9.4 mg/dL (ref 8.7–10.3)
Chloride: 114 mmol/L — ABNORMAL HIGH (ref 96–106)
Creatinine, Ser: 1.09 mg/dL — ABNORMAL HIGH (ref 0.57–1.00)
Glucose: 85 mg/dL (ref 70–99)
Potassium: 4.1 mmol/L (ref 3.5–5.2)
Sodium: 144 mmol/L (ref 134–144)
eGFR: 52 mL/min/{1.73_m2} — ABNORMAL LOW (ref >59)

## 2024-01-03 LAB — CBC
Hematocrit: 46.7 % — ABNORMAL HIGH (ref 34.0–46.6)
Hemoglobin: 15 g/dL (ref 11.1–15.9)
MCH: 30.1 pg (ref 26.6–33.0)
MCHC: 32.1 g/dL (ref 31.5–35.7)
MCV: 94 fL (ref 79–97)
Platelets: 125 10*3/uL — ABNORMAL LOW (ref 150–450)
RBC: 4.99 x10E6/uL (ref 3.77–5.28)
RDW: 15 % (ref 11.7–15.4)
WBC: 5.7 10*3/uL (ref 3.4–10.8)

## 2024-01-03 NOTE — Telephone Encounter (Signed)
 Will send to send [facility] providers for review Seen by [facility] 5/20

## 2024-01-05 NOTE — Telephone Encounter (Signed)
 Pt scheduled with DB 6/23

## 2024-01-09 ENCOUNTER — Telehealth: Payer: Self-pay | Admitting: Internal Medicine

## 2024-01-09 ENCOUNTER — Ambulatory Visit: Payer: Self-pay | Admitting: *Deleted

## 2024-01-09 DIAGNOSIS — I4819 Other persistent atrial fibrillation: Secondary | ICD-10-CM

## 2024-01-09 NOTE — Telephone Encounter (Signed)
 Copied from CRM [id number]. Topic: Clinical - Medication Refill >> Jan 09, 2024  8:39 AM [name] [name] wrote: Medication: Rivaroxaban  (XARELTO ) 15 MG TABS tablet,   Has the patient contacted their pharmacy? Yes, pharmacy unable to fill medication and said they have sent over request (Agent: If no, request that the patient contact the pharmacy for the refill. If patient does not wish to contact the pharmacy document the reason why and proceed with request.) (Agent: If yes, when and what did the pharmacy advise?)  This is the patient's preferred pharmacy:  [id number] [place], [place] - [address] AT [address] [place] [address] Phone: [phone] Fax: [phone]  Is this the correct pharmacy for this prescription? Yes If no, delete pharmacy and type the correct one.   Has the prescription been filled recently? No  Is the patient out of the medication? Yes  Has the patient been seen for an appointment in the last year OR does the patient have an upcoming appointment? No but patient is calling to schedule appointment   Can we respond through MyChart? No  Agent: Please be advised that Rx refills may take up to 3 business days. We ask that you follow-up with your pharmacy.

## 2024-01-09 NOTE — Telephone Encounter (Signed)
 Duplicate request, medication sent to pharmacy and confirmed received today.  Routing to provider for review/authorization.

## 2024-01-09 NOTE — Telephone Encounter (Signed)
  Chief Complaint: chronic COPD  Symptoms: patient reports spells of SOB- patient finds it hard to get enough air in- she state O2 helps Frequency: chronic- comes and goes- very SOB with ADL  Pertinent Negatives: Patient denies SOB now Disposition: [] ED /[] Urgent Care (no appt availability in office) / [x] Appointment(In office/virtual)/ []  [facility]/ [] Home Care/ [] Refused Recommended Disposition /[] [facility]/ []  Follow-up with PCP Additional Notes: Patient has been scheduled first available appointment  In office  Copied from CRM [id number]. Topic: Clinical - Red Word Triage >> Jan 09, 2024  8:46 AM Eleanore Grey wrote: Red Word that prompted transfer to Nurse Triage: Patient having trouble breathing, started last week. Patient says inhaler usually helps. Patient is also having pain going up left leg, right leg is broken in 3 places and thinks the left leg could be hurting due to the pressure being placed on it. Reason for Disposition  [1] MODERATE longstanding difficulty breathing (e.g., speaks in phrases, SOB even at rest, pulse 100-120) AND [2] SAME as normal  Answer Assessment - Initial Assessment Questions 1. RESPIRATORY STATUS: "Describe your breathing?" (e.g., wheezing, shortness of breath, unable to speak, severe coughing)      Wheezing,trouble getting air in 2. ONSET: "When did this breathing problem begin?"      1 week-same as always 3. PATTERN "Does the difficult breathing come and go, or has it been constant since it started?"      Comes and goes- O2 helps 4. SEVERITY: "How bad is your breathing?" (e.g., mild, moderate, severe)    - MILD: No SOB at rest, mild SOB with walking, speaks normally in sentences, can lie down, no retractions, pulse < 100.    - MODERATE: SOB at rest, SOB with minimal exertion and prefers to sit, cannot lie down flat, speaks in phrases, mild retractions, audible wheezing, pulse 100-120.    - SEVERE: Very SOB at rest, speaks in single  words, struggling to breathe, sitting hunched forward, retractions, pulse > 120      Mostly when laying down,mild- exertion 5. RECURRENT SYMPTOM: "Have you had difficulty breathing before?" If Yes, ask: "When was the last time?" and "What happened that time?"      Chronic- asthma 6. CARDIAC HISTORY: "Do you have any history of heart disease?" (e.g., heart attack, angina, bypass surgery, angioplasty)      pacemaker 7. LUNG HISTORY: "Do you have any history of lung disease?"  (e.g., pulmonary embolus, asthma, emphysema)     COPD,asthma 8. CAUSE: "What do you think is causing the breathing problem?"      chronic 9. OTHER SYMPTOMS: "Do you have any other symptoms? (e.g., dizziness, runny nose, cough, chest pain, fever)     no  Protocols used: Breathing Difficulty-A-AH

## 2024-01-10 ENCOUNTER — Ambulatory Visit (INDEPENDENT_AMBULATORY_CARE_PROVIDER_SITE_OTHER): Admitting: Emergency Medicine

## 2024-01-10 ENCOUNTER — Encounter: Payer: Self-pay | Admitting: Emergency Medicine

## 2024-01-10 VITALS — BP 142/96 | HR 62 | Temp 97.5°F | Ht 67.5 in | Wt 170.0 lb

## 2024-01-10 DIAGNOSIS — I5042 Chronic combined systolic (congestive) and diastolic (congestive) heart failure: Secondary | ICD-10-CM

## 2024-01-10 DIAGNOSIS — E785 Hyperlipidemia, unspecified: Secondary | ICD-10-CM | POA: Diagnosis not present

## 2024-01-10 DIAGNOSIS — E1169 Type 2 diabetes mellitus with other specified complication: Secondary | ICD-10-CM | POA: Diagnosis not present

## 2024-01-10 DIAGNOSIS — I255 Ischemic cardiomyopathy: Secondary | ICD-10-CM

## 2024-01-10 DIAGNOSIS — J449 Chronic obstructive pulmonary disease, unspecified: Secondary | ICD-10-CM | POA: Diagnosis not present

## 2024-01-10 DIAGNOSIS — I10 Essential (primary) hypertension: Secondary | ICD-10-CM

## 2024-01-10 DIAGNOSIS — I4819 Other persistent atrial fibrillation: Secondary | ICD-10-CM

## 2024-01-10 MED ORDER — AIRSUPRA 90-80 MCG/ACT IN AERO: 2.0000 | INHALATION_SPRAY | RESPIRATORY_TRACT | 1 refills | Status: AC | PRN

## 2024-01-10 NOTE — Assessment & Plan Note (Signed)
No recent anginal episodes.  Stable. 

## 2024-01-10 NOTE — Assessment & Plan Note (Addendum)
 BP Readings from Last 3 Encounters:  01/10/24 (!) 142/96  01/02/24 128/78  10/11/23 (!) 142/78  Blood pressure elevated in the office but normal at home Continues losartan  100 mg daily along with hydralazine  50 mg 3 times a day and Aldactone  25 mg daily

## 2024-01-10 NOTE — Patient Instructions (Signed)
 Chronic Obstructive Pulmonary Disease Exacerbation  Chronic obstructive pulmonary disease (COPD) is a long-term (chronic) lung problem. When you have COPD, it can feel harder to breathe in or out. COPD exacerbation is a flare-up of symptoms when breathing gets worse and more treatment may be needed. Without treatment, flare-ups can be life-threatening. If they happen often, your lungs can become more damaged. What are the causes? Not taking your usual COPD medicines as told by your health care provider. A cold or the flu, which can cause infection in your lungs. Being exposed to things that make your breathing worse, such as: Smoke. Air pollution. Fumes. Dust. Allergies. Weather changes. What are the signs or symptoms? Symptoms do not get better or get worse even if you take your medicines as told by your provider. Symptoms may include: More shortness of breath. You may only be able to speak one or two words at a time. More coughing or mucus from your lungs. More wheezing or chest tightness. Being more tired and having less energy. Confusion. How is this diagnosed? This condition is diagnosed based on: Symptoms that get worse. Your medical history. A physical exam. You may also have tests, including: A chest X-ray. Blood or mucus tests. How is this treated? You may be able to stay home or you may need to go to the hospital. Treatment may include: Taking medicines. These may include: Inhalers. These have medicines in them that you breathe in. These may be more of what you already take or they may be new. Steroids. These reduce inflammation in the airways. These may be inhaled, taken by mouth, or given in an IV. Antibiotics. These treat infection. Using oxygen. Using a device to help you clear mucus. Follow these instructions at home: Medicines Take your medicines only as told by your provider. If you were given antibiotics or steroids, take them as told by your provider. Do  not stop taking them even if you start to feel better. Lifestyle Several times a day, wash your hands with soap and water for at least 20 seconds. If you cannot use soap and water, use hand sanitizer. This may help keep you from getting an infection. Avoid being around crowds or people who are sick. Do not smoke or use any products that contain nicotine or tobacco. If you need help quitting, ask your provider. Return to your normal activities when your provider says that it's safe. Use breathing methods to control your stress and catch your breath. How is this prevented? Follow your COPD action plan. The action plan tells you what to do if you're feeling good and what to do when you start feeling worse. Discuss the plan often with your provider. Make sure you get all the shots, also called vaccines, that your provider recommends. Ask your provider about a flu shot and a pneumonia shot. Use oxygen therapy if told by your provider. If you need home oxygen therapy, ask your provider how often to check your oxygen level with a device called an oximeter. Keep all follow-up visits to review your COPD action plan. Your provider will want to check on your condition often to keep you healthy and out of the hospital. Contact a health care provider if: Your COPD symptoms get worse. You have a fever or chills. You have trouble doing daily activities. You have trouble breathing even when you are resting. Get help right away if: You are short of breath and cannot: Talk in full sentences. Do normal activities. You have chest  pain. You feel confused. These symptoms may be an emergency. Call 911 right away. Do not wait to see if the symptoms will go away. Do not drive yourself to the hospital. This information is not intended to replace advice given to you by your health care provider. Make sure you discuss any questions you have with your health care provider. Document Revised: 05/04/2023 Document  Reviewed: 10/17/2022 [employer] Patient Education  2024 [employer].

## 2024-01-10 NOTE — Assessment & Plan Note (Signed)
 Clinically stable. Continues metformin  500 mg twice a day and atorvastatin  10 mg daily

## 2024-01-10 NOTE — Assessment & Plan Note (Signed)
 Clinically euvolemic No acute findings of congestive heart failure Continues losartan , Aldactone , and hydralazine  along with Demadex  20 mg daily

## 2024-01-10 NOTE — Assessment & Plan Note (Signed)
 With acute exacerbation Continues daily Trelegy Recommend Airsupra  every 4-6 hours as needed DuoNeb nebulizer given in the office with improvement in shortness of breath ED precautions given Advised to follow-up with PCP within the next 2 weeks

## 2024-01-10 NOTE — Assessment & Plan Note (Signed)
Rate has been controlled.  Plan to continue blood pressure control and anticoagulation with rivaroxaban.

## 2024-01-10 NOTE — Progress Notes (Signed)
 [name] [name] 78 y.o.   Chief Complaint  Patient presents with   Shortness of Breath    Patient states she's been really SOB for the past 2-3 days. She does use Trelegy but only helps for a little.     HISTORY OF PRESENT ILLNESS: Acute problem visit today. This is a 78 y.o. female complaining of chronic shortness of breath worse over the past 2 to 3 days. Has multiple medical conditions including COPD and she continues to smoke. Also has hypertensive heart disease with chronic congestive heart failure Paroxysmal A-fib on long-term anticoagulation Has history of valvular heart disease as well History of ischemic cardiomyopathy/CAD with bypass surgery in the past Chronic kidney disease Recent cardiology evaluation for preop clearance as follows. Assessment & Plan Shortness of breath New dyspnea likely due to increased smoking and deconditioning. No new chest pain or stroke-like symptoms. - Order echocardiogram to assess cardiac function. - Check laboratory work for renal function and complete blood count.   Surgical risk due to heart condition High surgical risk due to cardiac condition. Previous surgery used regional block and local anesthesia. Current plan to replicate anesthesia protocol. Dyspnea and new symptoms require further evaluation before surgical clearance. Informed consent discussed regarding anesthesia risks. - Order echocardiogram prior to surgical clearance. - Surgical clearance contingent on echocardiogram results. - Discuss anesthesia plan with surgical team to avoid general anesthesia.   Fracture of right ankle Trimalleolar fracture with muscle atrophy and deconditioning after eight months of bed rest. Limited daily activity ability. - Continue physical therapy to enhance strength and mobility. - Encourage chair exercises to maintain activity level.   Smoking Increased tobacco use post-sister's death, currently six cigarettes per day. Smoking cessation crucial  for health and surgical risk reduction. - Encourage smoking cessation to improve health and reduce surgical risk.  Shortness of Breath Pertinent negatives include no abdominal pain, chest pain, fever, headaches, rash, sore throat, sputum production or vomiting.     Prior to Admission medications   Medication Sig Start Date End Date Taking? Authorizing Provider  acetaminophen  (TYLENOL ) 500 MG tablet Take 500 mg by mouth as needed for mild pain (pain score 1-3) or moderate pain (pain score 4-6).   Yes [provider]  Alcohol  Swabs  (ALCOHOL  PADS) 70 % PADS 1 Act by Does not apply route 2 (two) times daily. 04/28/23  Yes Arcadio Knuckles, MD  allopurinol  (ZYLOPRIM ) 100 MG tablet Take 1 tablet (100 mg total) by mouth daily. NEEDS FOLLOW UP APPOINTMENT FOR MORE REFILLS 11/28/23  Yes Bensimhon, Daniel R, MD  Aromatic Inhalants (VICKS VAPOR INHALER IN) Inhale into the lungs as needed.   Yes [provider]  atorvastatin  (LIPITOR ) 10 MG tablet TAKE 1 TABLET BY MOUTH EVERYDAY AT BEDTIME 07/22/23  Yes Arcadio Knuckles, MD  Blood Glucose Monitoring Suppl (ONE TOUCH ULTRA 2) w/Device KIT 1 Act by Does not apply route 2 (two) times daily. 04/28/23  Yes Arcadio Knuckles, MD  gabapentin  (NEURONTIN ) 100 MG capsule TAKE 1 CAPSULE BY MOUTH EVERYDAY AT BEDTIME 11/02/23  Yes Charity Conch, DPM  hydrALAZINE  (APRESOLINE ) 25 MG tablet TAKE 2 TABLETS (50 MG TOTAL) BY MOUTH 3 (THREE) TIMES DAILY. 09/22/23  Yes Bensimhon, Rheta Celestine, MD  losartan  (COZAAR ) 100 MG tablet Take 1 tablet (100 mg total) by mouth daily. 12/22/23  Yes Wellington Half, FNP  metFORMIN  (GLUCOPHAGE ) 500 MG tablet TAKE 1 TABLET BY MOUTH EVERY DAY WITH BREAKFAST 12/22/23  Yes Wellington Half, FNP  Naphazoline  HCl (CLEAR EYES OP) Place 2 drops into both eyes daily as needed (irritation).   Yes [provider]  [facility] ULTRA test strip 1 each by Other route 2 (two) times daily. as directed 04/28/23  Yes Arcadio Knuckles, MD   pantoprazole  (PROTONIX ) 40 MG tablet TAKE 1 TABLET BY MOUTH EVERY DAY 12/22/23  Yes Wellington Half, FNP  spironolactone  (ALDACTONE ) 25 MG tablet TAKE 1 TABLET (25 MG TOTAL) BY MOUTH DAILY. 12/19/23  Yes Bensimhon, Rheta Celestine, MD  torsemide  (DEMADEX ) 20 MG tablet TAKE 1.5 TABLETS BY MOUTH EVERY DAY 11/28/23  Yes Tammie Fall, MD  TRELEGY ELLIPTA  100-62.5-25 MCG/ACT AEPB INHALE 1 PUFF INTO THE LUNGS DAILY 12/25/23  Yes Arcadio Knuckles, MD  XARELTO  15 MG TABS tablet TAKE 1 TABLET (15 MG TOTAL) BY MOUTH DAILY WITH SUPPER 01/09/24  Yes Bensimhon, Rheta Celestine, MD  HYDROcodone -acetaminophen  (NORCO/VICODIN) 5-325 MG tablet Take 1 tablet by mouth every 4 (four) hours as needed for moderate pain (pain score 4-6) or severe pain (pain score 7-10). Patient not taking: Reported on 01/10/2024 06/28/23   Pokhrel, Laxman, MD  losartan  (COZAAR ) 100 MG tablet Take 1 tablet (100 mg total) by mouth daily. 10/11/23 01/09/24  Elmarie Hacking, FNP  losartan  (COZAAR ) 50 MG tablet Take 1 tablet (50 mg total) by mouth daily. 12/16/22   Bensimhon, Rheta Celestine, MD    Allergies  Allergen Reactions   Bee Venom Anaphylaxis   Ivp Dye [Iodinated Contrast Media] Anaphylaxis   Ace Inhibitors Other (See Comments)    angioedema   Atenolol Other (See Comments)    severe headaches   Codeine Nausea And Vomiting   Entresto  [Sacubitril -Valsartan ] Other (See Comments)    Chest pain    Isosorbide  Other (See Comments)    Severe headaches   Shrimp [Shellfish Allergy] Swelling   Simvastatin Other (See Comments)    not sure   Jardiance [Empagliflozin] Other (See Comments)    Caused Boils    Penicillin G Rash and Dermatitis    Patient Active Problem List   Diagnosis Date Noted   Cough productive of purulent sputum 12/29/2022   Type 2 diabetes mellitus with hyperlipidemia (HCC) 12/14/2022   Chronic combined systolic and diastolic CHF (congestive heart failure) (HCC) 06/17/2022   Chronic anticoagulation 06/06/2022   Mitral  regurgitation 06/06/2022   Controlled type 2 diabetes mellitus without complication, without long-term current use of insulin  (HCC) 06/06/2022   Pacemaker 02/22/2022   Obstructive sleep apnea 09/21/2020   Obesity (BMI 30.0-34.9) 02/10/2020   COPD (chronic obstructive pulmonary disease) (HCC) 10/26/2019   Thrombocytopenia (HCC) 09/14/2017   Medication noncompliance due to cognitive impairment 09/06/2017   Persistent atrial fibrillation (HCC) 08/04/2017   Cardiomyopathy, ischemic 02/08/2017   CKD (chronic kidney disease), stage III (HCC) 01/30/2017   Coronary artery disease due to lipid rich plaque    Essential hypertension     Past Medical History:  Diagnosis Date   (HFimpEF) heart failure with improved ejection fraction (HCC)    a. 07/2018 Echo: EF 30-35%; b. 11/2019 Echo: EF 55-60%, no rwma, Gr2 DD, Nl RV size/fxn. Mild BAE. Mild MR/AI.   Arthritis    Asthma    Atopic dermatitis    CAD (coronary artery disease)    a. 2016 s/p CABG x 2 (LIMA->LAD, VG->OM); b. 08/2018 MV: EF 44%, no ischemia/infact.   Cardiac arrest (HCC) 10/2014   Cardiomyopathy, ischemic    a. 07/2018 Echo: EF 30-35%; 11/2019 Echo: EF 55-60%.   Carotid arterial disease (HCC)  a. 11/2019 Carotid U/S   CHF (congestive heart failure) (HCC)    CKD (chronic kidney disease), stage III (HCC)    COPD (chronic obstructive pulmonary disease) (HCC)    Diabetes mellitus without complication (HCC)    Dysrhythmia    Esophageal dilatation 2013   GERD (gastroesophageal reflux disease)    Glaucoma    Gout    Headache    Hearing loss    History of blood transfusion    Hypertension    Hypokalemia    Idiopathic angioedema    LGI bleed 08/06/2017   a. felt to be hemorrhoidal during that admission (no drop in Hgb).   Lower back pain    Paroxysmal atrial fibrillation (HCC)    a. Dx 2016-->h/o difficult to control rates (complicated by noncompliance), not felt to be a candidate for ablation or antiarrhythmic due to  noncompliance; b. Recurrent AF 2021 - converted w/ IV dilt; c. CHA2DS2VASc = 7-->Xarelto .   Personal history of noncompliance with medical treatment, presenting hazards to health    Pneumonia    Prediabetes    Presence of permanent cardiac pacemaker    Right leg numbness    S/P CABG x 2 with clipping of LA appendage 11/14/2014   LIMA to LAD, SVG to OM, EVH via right thigh   Urine incontinence    Uterine fibroid     Past Surgical History:  Procedure Laterality Date   AV NODE ABLATION N/A 01/18/2021   Procedure: AV NODE ABLATION;  Surgeon: Tammie Fall, MD;  Location: [facility] INVASIVE CV LAB;  Service: Cardiovascular;  Laterality: N/A;   BALLOON DILATION N/A 10/10/2019   Procedure: BALLOON DILATION;  Surgeon: Felecia Hopper, MD;  Location: [facility] ENDOSCOPY;  Service: Gastroenterology;  Laterality: N/A;   BIOPSY  10/10/2019   Procedure: BIOPSY;  Surgeon: Felecia Hopper, MD;  Location: [facility] ENDOSCOPY;  Service: Gastroenterology;;   BIOPSY  10/11/2019   Procedure: BIOPSY;  Surgeon: Felecia Hopper, MD;  Location: [facility] ENDOSCOPY;  Service: Gastroenterology;;   [place] N/A 11/21/2022   Procedure: BIVI  ICD UPGRADE;  Surgeon: Tammie Fall, MD;  Location: [facility] INVASIVE CV LAB;  Service: Cardiovascular;  Laterality: N/A;   CARDIOVERSION N/A 11/18/2014   Procedure: CARDIOVERSION;  Surgeon: Hazle Lites, MD;  Location: [facility] OR;  Service: Cardiovascular;  Laterality: N/A;   CARDIOVERSION N/A 12/25/2017   Procedure: CARDIOVERSION;  Surgeon: Mardell Shade, MD;  Location: [facility] ENDOSCOPY;  Service: Cardiovascular;  Laterality: N/A;   CLIPPING OF ATRIAL APPENDAGE N/A 11/14/2014   Procedure: CLIPPING OF ATRIAL APPENDAGE;  Surgeon: Gardenia Jump, MD;  Location: [facility] OR;  Service: Open Heart Surgery;  Laterality: N/A;   COLONOSCOPY  2013   COLONOSCOPY WITH PROPOFOL  N/A 10/11/2019   Procedure: COLONOSCOPY WITH PROPOFOL ;  Surgeon: Felecia Hopper, MD;  Location: [facility] ENDOSCOPY;  Service:  Gastroenterology;  Laterality: N/A;   CORONARY ARTERY BYPASS GRAFT N/A 11/14/2014   Procedure: CORONARY ARTERY BYPASS GRAFTING (CABG)TIMES 2 USING LEFT INTERNAL MAMMARY ARTERY AND RIGHT SAPHENOUS VEIN HARVESTED ENDOSCOPICALLY;  Surgeon: Gardenia Jump, MD;  Location: [facility] OR;  Service: Open Heart Surgery;  Laterality: N/A;   ESOPHAGOGASTRODUODENOSCOPY (EGD) WITH PROPOFOL  N/A 10/10/2019   Procedure: ESOPHAGOGASTRODUODENOSCOPY (EGD) WITH PROPOFOL ;  Surgeon: Felecia Hopper, MD;  Location: [facility] ENDOSCOPY;  Service: Gastroenterology;  Laterality: N/A;   ESOPHAGOGASTRODUODENOSCOPY (EGD) WITH PROPOFOL  N/A 10/27/2019   Procedure: ESOPHAGOGASTRODUODENOSCOPY (EGD) WITH PROPOFOL ;  Surgeon: Genell Ken, MD;  Location: [facility] ENDOSCOPY;  Service: Gastroenterology;  Laterality: N/A;   GIVENS CAPSULE  STUDY N/A 10/27/2019   Procedure: GIVENS CAPSULE STUDY;  Surgeon: Genell Ken, MD;  Location: [facility] ENDOSCOPY;  Service: Gastroenterology;  Laterality: N/A;   INSERT / REPLACE / REMOVE PACEMAKER     LEFT HEART CATHETERIZATION WITH CORONARY ANGIOGRAM N/A 11/04/2014   Procedure: LEFT HEART CATHETERIZATION WITH CORONARY ANGIOGRAM;  Surgeon: Millicent Ally, MD;  Location: [facility] CATH LAB;  Service: Cardiovascular;  Laterality: N/A;   ORIF ANKLE FRACTURE Right 06/26/2023   Procedure: OPEN REDUCTION INTERNAL FIXATION (ORIF) BIMALLEOLAR ANKLE FRACTURE;  Surgeon: Ali Ink, MD;  Location: [facility] ORS;  Service: Orthopedics;  Laterality: Right;   PACEMAKER IMPLANT N/A 01/18/2021   Procedure: PACEMAKER IMPLANT;  Surgeon: Tammie Fall, MD;  Location: [facility] INVASIVE CV LAB;  Service: Cardiovascular;  Laterality: N/A;   PACEMAKER IMPLANT Left    POLYPECTOMY  10/11/2019   Procedure: POLYPECTOMY;  Surgeon: Felecia Hopper, MD;  Location: [facility] ENDOSCOPY;  Service: Gastroenterology;;   RIGHT HEART CATH N/A 06/08/2022   Procedure: RIGHT HEART CATH;  Surgeon: Mardell Shade, MD;  Location: [facility] INVASIVE CV LAB;  Service: Cardiovascular;   Laterality: N/A;   TEE WITHOUT CARDIOVERSION N/A 11/14/2014   Procedure: TRANSESOPHAGEAL ECHOCARDIOGRAM (TEE);  Surgeon: Gardenia Jump, MD;  Location: [facility] OR;  Service: Open Heart Surgery;  Laterality: N/A;   TEE WITHOUT CARDIOVERSION N/A 12/25/2017   Procedure: TRANSESOPHAGEAL ECHOCARDIOGRAM (TEE);  Surgeon: Mardell Shade, MD;  Location: [facility] ENDOSCOPY;  Service: Cardiovascular;  Laterality: N/A;   TEMPORARY PACEMAKER INSERTION  11/04/2014   Procedure: TEMPORARY PACEMAKER INSERTION;  Surgeon: Millicent Ally, MD;  Location: [facility] CATH LAB;  Service: Cardiovascular;;   TOOTH EXTRACTION      Social History   Socioeconomic History   Marital status: Widowed    Spouse name: Not on file   Number of children: 1   Years of education: 58   Highest education level: Not on file  Occupational History   Occupation: RETIRED [employer]  Tobacco Use   Smoking status: Some Days    Current packs/day: 0.00    Average packs/day: 0.5 packs/day for 56.0 years (28.0 ttl pk-yrs)    Types: Cigarettes    Start date: 11/13/1965    Last attempt to quit: 11/13/2021    Years since quitting: 2.1   Smokeless tobacco: Never   Tobacco comments:    01/02/2024 Patient smokes about 7-8 cigarettes daily    smoking 2 cigarettes/day as of 10/23/20  Vaping Use   Vaping status: Never Used  Substance and Sexual Activity   Alcohol  use: No    Alcohol /week: 0.0 standard drinks of alcohol    Drug use: No   Sexual activity: Not Currently  Other Topics Concern   Not on file  Social History Narrative   Patient reports it being difficult to pay for everything due to outstanding medical bills from hospital stay last year but states she is able to keep up with basic expenses.  Patient does have some concerns with her house's condition due to a water leak- had her roof repaired last month but has some leaking in the front which has affected her porch.  Patient owns her own car and is able to drive herself but has some  concerns about the reliability of her car- has gotten taxi to the clinic for appointment in the past when her car broke down.   Social Drivers of Health   Financial Resource Strain: Low Risk  (07/12/2023)   Overall Financial Resource Strain (CARDIA)    Difficulty  of Paying Living Expenses: Not hard at all  Food Insecurity: No Food Insecurity (07/03/2023)   Hunger Vital Sign    Worried About Running Out of Food in the Last Year: Never true    Ran Out of Food in the Last Year: Never true  Transportation Needs: No Transportation Needs (07/03/2023)   PRAPARE - Administrator, Civil Service (Medical): No    Lack of Transportation (Non-Medical): No  Physical Activity: Inactive (07/12/2023)   Exercise Vital Sign    Days of Exercise per Week: 0 days    Minutes of Exercise per Session: 0 min  Stress: Stress Concern Present (07/12/2023)   [employer] of Occupational Health - Occupational Stress Questionnaire    Feeling of Stress : Very much  Social Connections: Not on file  Intimate Partner Violence: Not At Risk (07/03/2023)   Humiliation, Afraid, Rape, and Kick questionnaire    Fear of Current or Ex-Partner: No    Emotionally Abused: No    Physically Abused: No    Sexually Abused: No    Family History  Problem Relation Age of Onset   Cancer Mother        LYMPHOMA   Heart disease Father    Other Father        TB   CVA Sister    Prostate cancer Brother 45   Diabetes Brother      Review of Systems  Constitutional: Negative.  Negative for chills and fever.  HENT:  Negative for congestion and sore throat.   Respiratory:  Positive for shortness of breath. Negative for cough and sputum production.   Cardiovascular:  Negative for chest pain and palpitations.  Gastrointestinal:  Negative for abdominal pain, diarrhea, nausea and vomiting.  Genitourinary: Negative.  Negative for dysuria and hematuria.  Skin: Negative.  Negative for rash.  Neurological: Negative.   Negative for dizziness and headaches.  All other systems reviewed and are negative.   Today's Vitals   01/10/24 0918  BP: (!) 142/96  Pulse: 62  Temp: (!) 97.5 F (36.4 C)  TempSrc: Oral  SpO2: 97%  Weight: 170 lb (77.1 kg)  Height: 5' 7.5" (1.715 m)   Body mass index is 26.23 kg/m.   Physical Exam Vitals reviewed.  Constitutional:      Appearance: Normal appearance.  HENT:     Head: Normocephalic.     Mouth/Throat:     Mouth: Mucous membranes are moist.     Pharynx: Oropharynx is clear.  Eyes:     Extraocular Movements: Extraocular movements intact.     Pupils: Pupils are equal, round, and reactive to light.  Cardiovascular:     Rate and Rhythm: Normal rate and regular rhythm.     Pulses: Normal pulses.     Heart sounds: Normal heart sounds.  Pulmonary:     Breath sounds: No wheezing, rhonchi or rales.     Comments: Distant bilateral breath sounds Musculoskeletal:     Cervical back: No tenderness.  Lymphadenopathy:     Cervical: No cervical adenopathy.  Skin:    General: Skin is warm and dry.  Neurological:     Mental Status: She is alert.  Psychiatric:        Mood and Affect: Mood normal.        Behavior: Behavior normal.      ASSESSMENT & PLAN: A total of 47 minutes was spent with the patient and counseling/coordination of care regarding preparing for this visit, review of most recent office  visit notes, review of multiple chronic medical conditions and their management, review of all medications, diagnosis of COPD exacerbation and management, review of most recent bloodwork results, review of health maintenance items, education on nutrition, ED precautions, prognosis, documentation, and need for follow up.   Problem List Items Addressed This Visit       Cardiovascular and Mediastinum   Essential hypertension   BP Readings from Last 3 Encounters:  01/10/24 (!) 142/96  01/02/24 128/78  10/11/23 (!) 142/78  Blood pressure elevated in the office but  normal at home Continues losartan  100 mg daily along with hydralazine  50 mg 3 times a day and Aldactone  25 mg daily       Cardiomyopathy, ischemic   No recent anginal episodes Stable.      Persistent atrial fibrillation (HCC)   Rate has been controlled.  Plan to continue blood pressure control and anticoagulation with rivaroxaban .       Chronic combined systolic and diastolic CHF (congestive heart failure) (HCC)   Clinically euvolemic No acute findings of congestive heart failure Continues losartan , Aldactone , and hydralazine  along with Demadex  20 mg daily        Respiratory   COPD (chronic obstructive pulmonary disease) (HCC) - Primary (Chronic)   With acute exacerbation Continues daily Trelegy Recommend Airsupra  every 4-6 hours as needed DuoNeb nebulizer given in the office with improvement in shortness of breath ED precautions given Advised to follow-up with PCP within the next 2 weeks      Relevant Medications   Albuterol -Budesonide  (AIRSUPRA ) 90-80 MCG/ACT AERO     Endocrine   Type 2 diabetes mellitus with hyperlipidemia (HCC)   Clinically stable. Continues metformin  500 mg twice a day and atorvastatin  10 mg daily      Patient Instructions  Chronic Obstructive Pulmonary Disease Exacerbation  Chronic obstructive pulmonary disease (COPD) is a long-term (chronic) lung problem. When you have COPD, it can feel harder to breathe in or out. COPD exacerbation is a flare-up of symptoms when breathing gets worse and more treatment may be needed. Without treatment, flare-ups can be life-threatening. If they happen often, your lungs can become more damaged. What are the causes? Not taking your usual COPD medicines as told by your health care provider. A cold or the flu, which can cause infection in your lungs. Being exposed to things that make your breathing worse, such as: Smoke. Air pollution. Fumes. Dust. Allergies. Weather changes. What are the signs or  symptoms? Symptoms do not get better or get worse even if you take your medicines as told by your provider. Symptoms may include: More shortness of breath. You may only be able to speak one or two words at a time. More coughing or mucus from your lungs. More wheezing or chest tightness. Being more tired and having less energy. Confusion. How is this diagnosed? This condition is diagnosed based on: Symptoms that get worse. Your medical history. A physical exam. You may also have tests, including: A chest X-ray. Blood or mucus tests. How is this treated? You may be able to stay home or you may need to go to the hospital. Treatment may include: Taking medicines. These may include: Inhalers. These have medicines in them that you breathe in. These may be more of what you already take or they may be new. Steroids. These reduce inflammation in the airways. These may be inhaled, taken by mouth, or given in an IV. Antibiotics. These treat infection. Using oxygen . Using a device to help you clear  mucus. Follow these instructions at home: Medicines Take your medicines only as told by your provider. If you were given antibiotics or steroids, take them as told by your provider. Do not stop taking them even if you start to feel better. Lifestyle Several times a day, wash your hands with soap and water for at least 20 seconds. If you cannot use soap and water, use hand sanitizer. This may help keep you from getting an infection. Avoid being around crowds or people who are sick. Do not smoke or use any products that contain nicotine  or tobacco. If you need help quitting, ask your provider. Return to your normal activities when your provider says that it's safe. Use breathing methods to control your stress and catch your breath. How is this prevented? Follow your COPD action plan. The action plan tells you what to do if you're feeling good and what to do when you start feeling worse. Discuss the  plan often with your provider. Make sure you get all the shots, also called vaccines, that your provider recommends. Ask your provider about a flu shot and a pneumonia shot. Use oxygen  therapy if told by your provider. If you need home oxygen  therapy, ask your provider how often to check your oxygen  level with a device called an oximeter. Keep all follow-up visits to review your COPD action plan. Your provider will want to check on your condition often to keep you healthy and out of the hospital. Contact a health care provider if: Your COPD symptoms get worse. You have a fever or chills. You have trouble doing daily activities. You have trouble breathing even when you are resting. Get help right away if: You are short of breath and cannot: Talk in full sentences. Do normal activities. You have chest pain. You feel confused. These symptoms may be an emergency. Call 911 right away. Do not wait to see if the symptoms will go away. Do not drive yourself to the hospital. This information is not intended to replace advice given to you by your health care provider. Make sure you discuss any questions you have with your health care provider. Document Revised: 05/04/2023 Document Reviewed: 10/17/2022 Elsevier Patient Education  2024 Elsevier Inc.   Maryagnes Small, MD [facility]

## 2024-01-14 ENCOUNTER — Other Ambulatory Visit: Payer: Self-pay | Admitting: Podiatry

## 2024-01-16 ENCOUNTER — Ambulatory Visit (INDEPENDENT_AMBULATORY_CARE_PROVIDER_SITE_OTHER): Payer: Self-pay

## 2024-01-16 DIAGNOSIS — I255 Ischemic cardiomyopathy: Secondary | ICD-10-CM

## 2024-01-16 LAB — CUP PACEART REMOTE DEVICE CHECK
Battery Voltage: 75
Date Time Interrogation Session: 20250603081225
Implantable Lead Connection Status: 753985
Implantable Lead Implant Date: 20220606
Implantable Lead Location: 753860
Implantable Lead Model: 377169
Implantable Lead Serial Number: 8000396500
Implantable Pulse Generator Implant Date: 20220606
Pulse Gen Model: 407145
Pulse Gen Serial Number: 70105656

## 2024-01-17 ENCOUNTER — Ambulatory Visit (HOSPITAL_COMMUNITY)
Admission: RE | Admit: 2024-01-17 | Discharge: 2024-01-17 | Disposition: A | Discharge status: Home / Self Care | Source: Ambulatory Visit | Attending: Nurse Practitioner | Admitting: Nurse Practitioner

## 2024-01-17 DIAGNOSIS — N183 Chronic kidney disease, stage 3 unspecified: Secondary | ICD-10-CM | POA: Diagnosis not present

## 2024-01-17 DIAGNOSIS — Z01810 Encounter for preprocedural cardiovascular examination: Secondary | ICD-10-CM

## 2024-01-17 DIAGNOSIS — I2510 Atherosclerotic heart disease of native coronary artery without angina pectoris: Secondary | ICD-10-CM

## 2024-01-17 DIAGNOSIS — I5022 Chronic systolic (congestive) heart failure: Secondary | ICD-10-CM

## 2024-01-17 DIAGNOSIS — Z720 Tobacco use: Secondary | ICD-10-CM

## 2024-01-17 DIAGNOSIS — I4819 Other persistent atrial fibrillation: Secondary | ICD-10-CM | POA: Diagnosis not present

## 2024-01-17 DIAGNOSIS — I340 Nonrheumatic mitral (valve) insufficiency: Secondary | ICD-10-CM

## 2024-01-17 DIAGNOSIS — I10 Essential (primary) hypertension: Secondary | ICD-10-CM | POA: Diagnosis not present

## 2024-01-17 LAB — ECHOCARDIOGRAM COMPLETE
AR max vel: 1.21 cm2
AV Area VTI: 1.16 cm2
AV Area mean vel: 1.22 cm2
AV Mean grad: 4 mmHg
AV Peak grad: 11.6 mmHg
Ao pk vel: 1.7 m/s
Area-P 1/2: 4.46 cm2
Calc EF: 25.2 %
Est EF: 25
S' Lateral: 4.43 cm
Single Plane A2C EF: 18.1 %
Single Plane A4C EF: 28.8 %

## 2024-01-18 ENCOUNTER — Ambulatory Visit: Payer: Self-pay | Admitting: Internal Medicine

## 2024-01-23 ENCOUNTER — Other Ambulatory Visit: Payer: Self-pay | Admitting: Internal Medicine

## 2024-01-23 ENCOUNTER — Other Ambulatory Visit: Payer: Self-pay | Admitting: Family Medicine

## 2024-01-23 DIAGNOSIS — J411 Mucopurulent chronic bronchitis: Secondary | ICD-10-CM

## 2024-01-25 ENCOUNTER — Telehealth (HOSPITAL_COMMUNITY): Payer: Self-pay

## 2024-01-25 NOTE — Telephone Encounter (Signed)
 Called to confirm/remind patient of their appointment at [facility] on 01/26/2024 8:30.   Appointment:   [x] Confirmed  [] Left mess   [] No answer/No voice mail  [] VM Full/unable to leave message  [] Phone not in service  Patient reminded to bring all medications and/or complete list.  Confirmed patient has transportation. Gave directions, instructed to utilize valet parking.

## 2024-01-25 NOTE — Progress Notes (Signed)
 [facility] Note  PCP: Arcadio Knuckles, MD Primary Cardiologist: Dr. Renna Cary  EP: Dr Carolynne Citron HF MD: Dr Julane Ny   HPI: [name] [name] is a 78 y.o. female with h/o HTN, former tobacco abuse, permanent AF, CAD s/p CABG x 2 (LIMA to LAD and SVG to OM) in 2016, Chronic combined HF, Ischemic CM, HLD, and Biotronik PPM.   She was initially noted to be in atrial fibrillation in 2016 with a reduced LVEF, leading to ischemic workup with a cardiac cath showing severe multivessel CAD with LM dz -> CABG x2 utilizing LIMA to LAD, SVG to OM.   Admitted 1/19 for AF with RVR in setting of Influenza A. EP was consulted to discuss plans for Tikosyn vs. ablation vs. medication rate control however, due to her known medication noncompliance she was found to not be an ablation nor antiarrhythmic candidate   2019 back in Afib. She was started on amiodarone  & Entresto .  She underwent successful atrial flutter TEE/DCCV on 12/25/17.  Later went back in A fib.   Admitted 3/21 for iron-def anemia. Hgb 5.4 EGD/colonoscopy which were unremarkable and capsule endoscopy which showed duodenal ulcer with bleeding and stigmata.  GI recommended oral Protonix  and outpatient follow-up.  Admitted 6/22 for AV node ablation and PPM due to uncontrolled heart rates with A fib. Underwent Biotronix single lead PPM. Toprol  XL and digoxin  stopped. Carvedilol  25 mg bid started. She was discharged 01/19/21.    Admitted 02/10/21 with a/c systolic heart failure. Diuresed with IV lasix  and transitioned to torsemide  40 mg daily. EF 25-30% with severe MR/TR.    Admitted 10/22 for COPD exacerbation with a component of A/C CHF, ran out of torsemide  several days prior  Echo 07/30/21: EF 25-30% Moderate RV dysfunction. Severe biatrial enlargement. Severe central MR/TR.   Was seen at [facility] in 4/23 for second opinion. Restarted Entresto  24/26 bid. Did not tolerate due to CP. Agreed she might candidate for  mTEER down the road.    Admitted 10/23 with a/c HF, worrisome for low output. Echo showed EF 20%, severe LV dysfunction, RV moderately down.  Diuresed with IV lasix  and started on milrinone . RHC showed low volume and CI 1.9 on milrinone , improved with small IVF bolus. Drips weaned and GDMT added back. BiDil  increased with elevated BP. Referred back to Paramedicine to help with meds. Discharged home, weight 183 lbs.  EP attempted to upgrade her PPM to ICD on 04/08 but she was found to have occlusion of left subclavian vein and procedure was aborted. Saw Dr. Carolynne Citron 04/25 and offered lead extraction and insertion of BiV ICD with removal of pacing lead and insertion of CS lead, left bundle area lead and ICD lead. She was not interested. She appeared volume overloaded and Torsemide  was increased.   Admitted 4/24 with a/c HF, 2/2 medical non-adherence. Diuresed with IV lasix . Echo showed EF 25%, RV moderately reduced, RVSP 55 mmHg, severe BAE, moderate MR. GDMT titrated, and she was discharged home, weight 171 lbs.  Today she returns for HF follow up with a friend. Functionally limited by joint pain.  ? Ortho considering removing pins. Able to walk a few steps but limited by pain. Gets SOB with exertion.  Denies PND/Orthopnea. Appetite ok. No fever or chills. Taking all medications.  She has been managing all her medications. She continues to smoke.  Cardiac  Studies   - RHC (10/23): on milrinone  0.25 RA = 1 RV = 27/1 PA = 32/13 (18) PCW = 2 Fick cardiac output/index = 5.3/2.7 Thermo = 3.8/1.9 PVR = 3.0 (fick) 4.2 (TD) Ao sat = 93% PA sat = 71%, 68%  Given 175 cc NS load  PCW = 5 TD CO/CI = 4.1/2.1  1. Low volume status 2. With mild to moderately depressed CO on milrinone  3. Mild improvement in hemodynamics with volume loading    -Echo 8/24: EF 30-35%  - Echo 4/24: EF 25%, RV moderately down, moderate MR - Echo (10/23): EF 20%, severe LV dysfunction, RV moderately down - Echo (4/22): EF  25-30% RV moderately reduced  - Echo (11/21): EF 30-35% RV normal  - Echo (4/21): EF 55-60%  - Stress Test (1/20): Low Risk EF 44% - Echo (12/19): EF 30-35% RV mildly dilated - Echo (5/18): EF stable 50-55% - Echo (3/16): EF 35-40%.  ROS: All systems negative except as listed in HPI, PMH and Problem List.  SH:  Social History   Socioeconomic History   Marital status: Widowed    Spouse name: Not on file   Number of children: 1   Years of education: 73   Highest education level: Not on file  Occupational History   Occupation: RETIRED [employer]  Tobacco Use   Smoking status: Some Days    Current packs/day: 0.00    Average packs/day: 0.5 packs/day for 56.0 years (28.0 ttl pk-yrs)    Types: Cigarettes    Start date: 11/13/1965    Last attempt to quit: 11/13/2021    Years since quitting: 2.2   Smokeless tobacco: Never   Tobacco comments:    01/02/2024 Patient smokes about 7-8 cigarettes daily    smoking 2 cigarettes/day as of 10/23/20  Vaping Use   Vaping status: Never Used  Substance and Sexual Activity   Alcohol  use: No    Alcohol /week: 0.0 standard drinks of alcohol    Drug use: No   Sexual activity: Not Currently  Other Topics Concern   Not on file  Social History Narrative   Patient reports it being difficult to pay for everything due to outstanding medical bills from hospital stay last year but states she is able to keep up with basic expenses.  Patient does have some concerns with her house's condition due to a water leak- had her roof repaired last month but has some leaking in the front which has affected her porch.  Patient owns her own car and is able to drive herself but has some concerns about the reliability of her car- has gotten taxi to the clinic for appointment in the past when her car broke down.   Social Drivers of Corporate investment banker Strain: Low Risk  (07/12/2023)   Overall Financial Resource Strain (CARDIA)    Difficulty of Paying Living  Expenses: Not hard at all  Food Insecurity: No Food Insecurity (07/03/2023)   Hunger Vital Sign    Worried About Running Out of Food in the Last Year: Never true    Ran Out of Food in the Last Year: Never true  Transportation Needs: No Transportation Needs (07/03/2023)   PRAPARE - Administrator, Civil Service (Medical): No    Lack of Transportation (Non-Medical): No  Physical Activity: Inactive (07/12/2023)   Exercise Vital Sign    Days of Exercise per Week: 0 days    Minutes of Exercise per Session: 0 min  Stress: Stress  Concern Present (07/12/2023)   [employer] of Occupational Health - Occupational Stress Questionnaire    Feeling of Stress : Very much  Social Connections: Not on file  Intimate Partner Violence: Not At Risk (07/03/2023)   Humiliation, Afraid, Rape, and Kick questionnaire    Fear of Current or Ex-Partner: No    Emotionally Abused: No    Physically Abused: No    Sexually Abused: No   FH:  Family History  Problem Relation Age of Onset   Cancer Mother        LYMPHOMA   Heart disease Father    Other Father        TB   CVA Sister    Prostate cancer Brother 34   Diabetes Brother    Past Medical History:  Diagnosis Date   (HFimpEF) heart failure with improved ejection fraction (HCC)    a. 07/2018 Echo: EF 30-35%; b. 11/2019 Echo: EF 55-60%, no rwma, Gr2 DD, Nl RV size/fxn. Mild BAE. Mild MR/AI.   Arthritis    Asthma    Atopic dermatitis    CAD (coronary artery disease)    a. 2016 s/p CABG x 2 (LIMA->LAD, VG->OM); b. 08/2018 MV: EF 44%, no ischemia/infact.   Cardiac arrest (HCC) 10/2014   Cardiomyopathy, ischemic    a. 07/2018 Echo: EF 30-35%; 11/2019 Echo: EF 55-60%.   Carotid arterial disease (HCC)    a. 11/2019 Carotid U/S   CHF (congestive heart failure) (HCC)    CKD (chronic kidney disease), stage III (HCC)    COPD (chronic obstructive pulmonary disease) (HCC)    Diabetes mellitus without complication (HCC)    Dysrhythmia     Esophageal dilatation 2013   GERD (gastroesophageal reflux disease)    Glaucoma    Gout    Headache    Hearing loss    History of blood transfusion    Hypertension    Hypokalemia    Idiopathic angioedema    LGI bleed 08/06/2017   a. felt to be hemorrhoidal during that admission (no drop in Hgb).   Lower back pain    Paroxysmal atrial fibrillation (HCC)    a. Dx 2016-->h/o difficult to control rates (complicated by noncompliance), not felt to be a candidate for ablation or antiarrhythmic due to noncompliance; b. Recurrent AF 2021 - converted w/ IV dilt; c. CHA2DS2VASc = 7-->Xarelto .   Personal history of noncompliance with medical treatment, presenting hazards to health    Pneumonia    Prediabetes    Presence of permanent cardiac pacemaker    Right leg numbness    S/P CABG x 2 with clipping of LA appendage 11/14/2014   LIMA to LAD, SVG to OM, EVH via right thigh   Urine incontinence    Uterine fibroid    Current Outpatient Medications  Medication Sig Dispense Refill   acetaminophen  (TYLENOL ) 500 MG tablet Take 500 mg by mouth as needed for mild pain (pain score 1-3) or moderate pain (pain score 4-6).     Albuterol -Budesonide  (AIRSUPRA ) 90-80 MCG/ACT AERO Inhale 2 puffs into the lungs every 4 (four) hours as needed. 10.7 g 1   Alcohol  Swabs  (ALCOHOL  PADS) 70 % PADS 1 Act by Does not apply route 2 (two) times daily. 100 each 3   allopurinol  (ZYLOPRIM ) 100 MG tablet Take 1 tablet (100 mg total) by mouth daily. NEEDS FOLLOW UP APPOINTMENT FOR MORE REFILLS 90 tablet 1   Aromatic Inhalants (VICKS VAPOR INHALER IN) Inhale into the lungs as needed.  atorvastatin  (LIPITOR ) 10 MG tablet TAKE 1 TABLET BY MOUTH EVERYDAY AT BEDTIME 90 tablet 0   Blood Glucose Monitoring Suppl (ONE TOUCH ULTRA 2) w/Device KIT 1 Act by Does not apply route 2 (two) times daily. 1 kit 3   gabapentin  (NEURONTIN ) 100 MG capsule TAKE 1 CAPSULE BY MOUTH EVERYDAY AT BEDTIME 30 capsule 1   hydrALAZINE  (APRESOLINE ) 25  MG tablet TAKE 2 TABLETS (50 MG TOTAL) BY MOUTH 3 (THREE) TIMES DAILY. 540 tablet 1   losartan  (COZAAR ) 100 MG tablet Take 1 tablet (100 mg total) by mouth daily. 90 tablet 1   metFORMIN  (GLUCOPHAGE ) 500 MG tablet TAKE 1 TABLET BY MOUTH EVERY DAY WITH BREAKFAST 90 tablet 0   Naphazoline HCl (CLEAR EYES OP) Place 2 drops into both eyes daily as needed (irritation).     ONETOUCH ULTRA test strip 1 each by Other route 2 (two) times daily. as directed 100 each 3   pantoprazole  (PROTONIX ) 40 MG tablet TAKE 1 TABLET BY MOUTH EVERY DAY 90 tablet 0   spironolactone  (ALDACTONE ) 25 MG tablet TAKE 1 TABLET (25 MG TOTAL) BY MOUTH DAILY. 90 tablet 3   torsemide  (DEMADEX ) 20 MG tablet TAKE 1.5 TABLETS BY MOUTH EVERY DAY 135 tablet 1   TRELEGY ELLIPTA  100-62.5-25 MCG/ACT AEPB INHALE 1 PUFF INTO THE LUNGS DAILY 60 each 2   XARELTO  15 MG TABS tablet TAKE 1 TABLET (15 MG TOTAL) BY MOUTH DAILY WITH SUPPER 30 tablet 8   HYDROcodone -acetaminophen  (NORCO/VICODIN) 5-325 MG tablet Take 1 tablet by mouth every 4 (four) hours as needed for moderate pain (pain score 4-6) or severe pain (pain score 7-10). (Patient not taking: Reported on 01/26/2024) 30 tablet 0   No current facility-administered medications for this encounter.   BP 138/78   Pulse 80   Wt 74.2 kg (163 lb 9.6 oz)   SpO2 97%   BMI 25.25 kg/m   Wt Readings from Last 3 Encounters:  01/26/24 74.2 kg (163 lb 9.6 oz)  01/10/24 77.1 kg (170 lb)  01/02/24 78.3 kg (172 lb 9.6 oz)   PHYSICAL EXAM: General:   No resp difficulty. Arrived in wheelchair Neck: supple. no JVD.  Cor: PMI nondisplaced. Irregular rate & rhythm. No rubs, gallops or murmurs. Lungs: clear Abdomen: soft, nontender, nondistended.  Extremities: no cyanosis, clubbing, rash, edema Neuro: alert & oriented x3  ASSESSMENT & PLAN: 1. Chronic Systolic Heart Failure - Suspect Combined ICM /NICM with previous A fib RVR. RV Pacing? - In 2021 EF improved 55-60% but over the last year EF has  been down 25-30 % with severe MR/TR - Echo (10/23): EF 20%, moderate LVH, RV moderately reduced, RVSP 46 mmHg, moderate BAE, mild to moderate MR, moderate TR, - Echo (4/24): EF 25%, RV moderately reduced, RVSP 55 mmHg, severe BAE, moderate MR - Echo 8/24: EF 30-35% severe BAE, moderate MR - Echo 2025 EF 25% severe MR, Severe TR, RV moderately reduced, pulmonary pressures in 70s. Per Dr Julane Ny - Surgery would be high risk for general anesthesia unless absolutely necessary. Would avoid general anesthesia at all cost.  - NYHA III, confounded by COPD.  - Appears euvolemic. Continue torsemide  20 mg daily. - Continue spironolactone  25 mg daily. - Continue losartan   100 mg daily (failed Entresto  with chest pain).  - Continue hydralazine  50 mg tid, refuses Imdur  d/t headaches. - No SGLT2i due UTI/yeast infections.  - No beta blocker  high burden of RV pacing on device checks (61% last check 3/24) - Would likely  benefit from upgrade to CRT. Upgrade PPM to ICD aborted 8/24 d/t occlusion of left subclavian vein. Dr. Carolynne Citron saw patient and offered lead extraction and insertion of CRT, which she declined.  2. MR, previously Severe - Moderate on echo 8/24   3. Permanent A fib  - Biotronik dual chamber PPM/AV nodal ablation 2022.  - Rate controlled.  - Continue Xarelto  15 mg daily (adjusted for CrCl).    4. CAD - s/p CABG x2 2016 (LIMA to LAD and SVG to OM)  - No s/s angina  - No ASA given AC - Continue statin.   5. COPD/tobacco abuse - Continues to smoke. Discussed cessation.  - Offered pulmonary referral but she declines and tells me she doesn't want to see another MD.    7. CKD IIIb - Baseline SCr 1.2-1.4 I reviewed BMET from 01/02/24, stable.   Will discuss medical clearance with Dr Julane Ny.  I discussed with Dr Julane Ny. Echo EF 25%, severe MR, Severe TR, RV moderately reduced, pulmonary pressures in 70s.  Per Dr Julane Ny - Surgery would be high risk for general anesthesia unless  absolutely necessary. Would avoid general anesthesia at all cost.   We will call her with the above.   Follow up in 3 months with Dr Selinda Dales, NP  9:01 AM 01/26/24

## 2024-01-26 ENCOUNTER — Ambulatory Visit (HOSPITAL_COMMUNITY)
Admission: RE | Admit: 2024-01-26 | Discharge: 2024-01-26 | Disposition: A | Discharge status: Home / Self Care | Source: Ambulatory Visit | Attending: Adult Health | Admitting: Adult Health

## 2024-01-26 ENCOUNTER — Telehealth (HOSPITAL_COMMUNITY): Payer: Self-pay | Admitting: Adult Health

## 2024-01-26 VITALS — BP 138/78 | HR 80 | Wt 163.6 lb

## 2024-01-26 DIAGNOSIS — E785 Hyperlipidemia, unspecified: Secondary | ICD-10-CM | POA: Insufficient documentation

## 2024-01-26 DIAGNOSIS — I2510 Atherosclerotic heart disease of native coronary artery without angina pectoris: Secondary | ICD-10-CM | POA: Insufficient documentation

## 2024-01-26 DIAGNOSIS — I4821 Permanent atrial fibrillation: Secondary | ICD-10-CM | POA: Insufficient documentation

## 2024-01-26 DIAGNOSIS — I255 Ischemic cardiomyopathy: Secondary | ICD-10-CM | POA: Insufficient documentation

## 2024-01-26 DIAGNOSIS — Z720 Tobacco use: Secondary | ICD-10-CM | POA: Diagnosis not present

## 2024-01-26 DIAGNOSIS — Z951 Presence of aortocoronary bypass graft: Secondary | ICD-10-CM | POA: Diagnosis not present

## 2024-01-26 DIAGNOSIS — J449 Chronic obstructive pulmonary disease, unspecified: Secondary | ICD-10-CM | POA: Diagnosis not present

## 2024-01-26 DIAGNOSIS — I5022 Chronic systolic (congestive) heart failure: Secondary | ICD-10-CM | POA: Insufficient documentation

## 2024-01-26 DIAGNOSIS — I5042 Chronic combined systolic (congestive) and diastolic (congestive) heart failure: Secondary | ICD-10-CM | POA: Diagnosis present

## 2024-01-26 DIAGNOSIS — Z79899 Other long term (current) drug therapy: Secondary | ICD-10-CM | POA: Insufficient documentation

## 2024-01-26 DIAGNOSIS — N183 Chronic kidney disease, stage 3 unspecified: Secondary | ICD-10-CM

## 2024-01-26 DIAGNOSIS — Z7984 Long term (current) use of oral hypoglycemic drugs: Secondary | ICD-10-CM | POA: Diagnosis not present

## 2024-01-26 DIAGNOSIS — I130 Hypertensive heart and chronic kidney disease with heart failure and stage 1 through stage 4 chronic kidney disease, or unspecified chronic kidney disease: Secondary | ICD-10-CM | POA: Insufficient documentation

## 2024-01-26 DIAGNOSIS — Z950 Presence of cardiac pacemaker: Secondary | ICD-10-CM | POA: Diagnosis not present

## 2024-01-26 DIAGNOSIS — F17210 Nicotine dependence, cigarettes, uncomplicated: Secondary | ICD-10-CM | POA: Diagnosis not present

## 2024-01-26 DIAGNOSIS — N1832 Chronic kidney disease, stage 3b: Secondary | ICD-10-CM | POA: Diagnosis not present

## 2024-01-26 DIAGNOSIS — I340 Nonrheumatic mitral (valve) insufficiency: Secondary | ICD-10-CM | POA: Insufficient documentation

## 2024-01-26 DIAGNOSIS — I110 Hypertensive heart disease with heart failure: Secondary | ICD-10-CM | POA: Diagnosis present

## 2024-01-26 NOTE — Telephone Encounter (Signed)
Left message for her to call office back.

## 2024-01-26 NOTE — Telephone Encounter (Signed)
    I discussed with Dr Julane Ny possible orthopedic surgery.    Dr Julane Ny reviewed Echo. - Echo EF 25%, severe MR, Severe TR, RV moderately reduced, pulmonary pressures in 70s.    Per Dr Julane Ny - Surgery would be high risk for general anesthesia unless absolutely necessary. Would avoid general anesthesia at all cost.    Please call with the above. Thank you   Marquise Lambson NP-C  9:59 AM

## 2024-01-26 NOTE — Patient Instructions (Signed)
 There has been no changes to your medications.  Your physician recommends that you schedule a follow-up appointment in: 3 months.  If you have any questions or concerns before your next appointment please send us  a message through [facility] or call our office at [phone].    TO LEAVE A MESSAGE FOR THE NURSE SELECT OPTION 2, PLEASE LEAVE A MESSAGE INCLUDING: YOUR NAME DATE OF BIRTH CALL BACK NUMBER REASON FOR CALL**this is important as we prioritize the call backs  YOU WILL RECEIVE A CALL BACK THE SAME DAY AS LONG AS YOU CALL BEFORE 4:00 PM  At the [facility], you and your health needs are our priority. As part of our continuing mission to provide you with exceptional heart care, we have created designated Provider Care Teams. These Care Teams include your primary Cardiologist (physician) and Advanced Practice Providers (APPs- Physician Assistants and Nurse Practitioners) who all work together to provide you with the care you need, when you need it.   You may see any of the following providers on your designated Care Team at your next follow up: Dr Jules Oar Dr Peder Bourdon Dr. Alwin Baars Dr. Arta Lark Amy Marijane Shoulders, NP Ruddy Corral, [place] [facility], [place] Dennise Fitz, NP [place] Lee, NP Shawnee Dellen, NP Luster Salters, PharmD Bevely Brush, PharmD   Please be sure to bring in all your medications bottles to every appointment.    Thank you for choosing [facility]

## 2024-01-30 ENCOUNTER — Ambulatory Visit: Admitting: Internal Medicine

## 2024-01-30 ENCOUNTER — Encounter: Payer: Self-pay | Admitting: Internal Medicine

## 2024-01-30 VITALS — BP 134/88 | HR 63 | Temp 97.9°F | Resp 16 | Ht 67.5 in | Wt 166.2 lb

## 2024-01-30 DIAGNOSIS — I10 Essential (primary) hypertension: Secondary | ICD-10-CM | POA: Diagnosis not present

## 2024-01-30 DIAGNOSIS — I4819 Other persistent atrial fibrillation: Secondary | ICD-10-CM

## 2024-01-30 DIAGNOSIS — D696 Thrombocytopenia, unspecified: Secondary | ICD-10-CM | POA: Diagnosis not present

## 2024-01-30 DIAGNOSIS — J0140 Acute pansinusitis, unspecified: Secondary | ICD-10-CM

## 2024-01-30 DIAGNOSIS — E119 Type 2 diabetes mellitus without complications: Secondary | ICD-10-CM

## 2024-01-30 DIAGNOSIS — E785 Hyperlipidemia, unspecified: Secondary | ICD-10-CM

## 2024-01-30 DIAGNOSIS — N1831 Chronic kidney disease, stage 3a: Secondary | ICD-10-CM

## 2024-01-30 DIAGNOSIS — Z0000 Encounter for general adult medical examination without abnormal findings: Secondary | ICD-10-CM | POA: Diagnosis not present

## 2024-01-30 DIAGNOSIS — E1169 Type 2 diabetes mellitus with other specified complication: Secondary | ICD-10-CM

## 2024-01-30 DIAGNOSIS — Z0001 Encounter for general adult medical examination with abnormal findings: Secondary | ICD-10-CM

## 2024-01-30 LAB — BASIC METABOLIC PANEL WITH GFR
BUN: 26 mg/dL — ABNORMAL HIGH (ref 6–23)
CO2: 23 meq/L (ref 19–32)
Calcium: 9.7 mg/dL (ref 8.4–10.5)
Chloride: 110 meq/L (ref 96–112)
Creatinine, Ser: 1.15 mg/dL (ref 0.40–1.20)
GFR: 45.43 mL/min — ABNORMAL LOW (ref >60.00)
Glucose, Bld: 100 mg/dL — ABNORMAL HIGH (ref 70–99)
Potassium: 4.1 meq/L (ref 3.5–5.1)
Sodium: 142 meq/L (ref 135–145)

## 2024-01-30 LAB — URINALYSIS, ROUTINE W REFLEX MICROSCOPIC
Bilirubin Urine: NEGATIVE
Hgb urine dipstick: NEGATIVE
Ketones, ur: NEGATIVE
Leukocytes,Ua: NEGATIVE
Nitrite: NEGATIVE
RBC / HPF: NONE SEEN (ref 0–?)
Specific Gravity, Urine: 1.025 (ref 1.000–1.030)
Total Protein, Urine: 100 — AB
Urine Glucose: NEGATIVE
Urobilinogen, UA: 0.2 (ref 0.0–1.0)
pH: 6 (ref 5.0–8.0)

## 2024-01-30 LAB — LIPID PANEL
Cholesterol: 155 mg/dL (ref 0–200)
HDL: 43.3 mg/dL (ref >39.00)
LDL Cholesterol: 96 mg/dL (ref 0–99)
NonHDL: 112.16
Total CHOL/HDL Ratio: 4
Triglycerides: 79 mg/dL (ref 0.0–149.0)
VLDL: 15.8 mg/dL (ref 0.0–40.0)

## 2024-01-30 LAB — HEPATIC FUNCTION PANEL
ALT: 14 U/L (ref 0–35)
AST: 17 U/L (ref 0–37)
Albumin: 4 g/dL (ref 3.5–5.2)
Alkaline Phosphatase: 101 U/L (ref 39–117)
Bilirubin, Direct: 0.2 mg/dL (ref 0.0–0.3)
Total Bilirubin: 0.9 mg/dL (ref 0.2–1.2)
Total Protein: 7.2 g/dL (ref 6.0–8.3)

## 2024-01-30 LAB — MICROALBUMIN / CREATININE URINE RATIO
Creatinine,U: 159.8 mg/dL
Microalb Creat Ratio: 252.2 mg/g — ABNORMAL HIGH (ref 0.0–30.0)
Microalb, Ur: 40.3 mg/dL — ABNORMAL HIGH (ref 0.0–1.9)

## 2024-01-30 LAB — TSH: TSH: 1.27 u[IU]/mL (ref 0.35–5.50)

## 2024-01-30 LAB — VITAMIN B12: Vitamin B-12: 574 pg/mL (ref 211–911)

## 2024-01-30 LAB — FOLATE: Folate: 10.7 ng/mL (ref >5.9)

## 2024-01-30 LAB — HEMOGLOBIN A1C: Hgb A1c MFr Bld: 5.6 % (ref 4.6–6.5)

## 2024-01-30 MED ORDER — CEFPODOXIME PROXETIL 200 MG PO TABS: 200.0000 mg | ORAL_TABLET | Freq: Two times a day (BID) | ORAL | 0 refills | Status: AC

## 2024-01-30 NOTE — Patient Instructions (Signed)

## 2024-01-30 NOTE — Progress Notes (Unsigned)
 Subjective:  Patient ID: [name] [name], female    DOB: 09/05/1945  Age: 78 y.o. MRN: [id number]  CC: Hypertension, Annual Exam, Sinusitis, and Diabetes   HPI [name] [name] presents for a CPX and f/up ---  Discussed the use of AI scribe software for clinical note transcription with the patient, who gave verbal consent to proceed.  History of Present Illness   [name] [name] is a 78 year old female with atrial fibrillation who presents with concerns about her foot, stomach, and sinus issues.  She has ongoing issues with her foot, which was fractured approximately eight months ago. The foot continues to cause discomfort, with pins and plates still in place. There is swelling around the fracture site and persistent dark skin discoloration since the cast removal. She was prescribed 'Oscar Kono' for pain but prefers Tylenol  due to concerns about addiction.  She describes gastrointestinal issues, including a sensation of 'fighting' in her stomach. She denies hematochezia or melena.  She reports respiratory symptoms, including mucus production when lying down, attributed to sinus drainage. No cough, fever, or chills, but she experiences night sweats. The mucus varies between green and clear. She has a history of atrial fibrillation and notes that her heart rate may increase after certain activities.        Outpatient Medications Prior to Visit  Medication Sig Dispense Refill   acetaminophen  (TYLENOL ) 500 MG tablet Take 500 mg by mouth as needed for mild pain (pain score 1-3) or moderate pain (pain score 4-6).     Albuterol -Budesonide  (AIRSUPRA ) 90-80 MCG/ACT AERO Inhale 2 puffs into the lungs every 4 (four) hours as needed. 10.7 g 1   Alcohol  Swabs  (ALCOHOL  PADS) 70 % PADS 1 Act by Does not apply route 2 (two) times daily. 100 each 3   allopurinol  (ZYLOPRIM ) 100 MG tablet Take 1 tablet (100 mg total) by mouth daily. NEEDS FOLLOW UP APPOINTMENT FOR MORE REFILLS 90 tablet 1   Aromatic  Inhalants (VICKS VAPOR INHALER IN) Inhale into the lungs as needed.     atorvastatin  (LIPITOR ) 10 MG tablet TAKE 1 TABLET BY MOUTH EVERYDAY AT BEDTIME 90 tablet 0   Blood Glucose Monitoring Suppl (ONE TOUCH ULTRA 2) w/Device KIT 1 Act by Does not apply route 2 (two) times daily. 1 kit 3   gabapentin  (NEURONTIN ) 100 MG capsule TAKE 1 CAPSULE BY MOUTH EVERYDAY AT BEDTIME 30 capsule 1   hydrALAZINE  (APRESOLINE ) 25 MG tablet TAKE 2 TABLETS (50 MG TOTAL) BY MOUTH 3 (THREE) TIMES DAILY. 540 tablet 1   losartan  (COZAAR ) 100 MG tablet Take 1 tablet (100 mg total) by mouth daily. 90 tablet 1   ONETOUCH ULTRA test strip 1 each by Other route 2 (two) times daily. as directed 100 each 3   pantoprazole  (PROTONIX ) 40 MG tablet TAKE 1 TABLET BY MOUTH EVERY DAY 90 tablet 0   spironolactone  (ALDACTONE ) 25 MG tablet TAKE 1 TABLET (25 MG TOTAL) BY MOUTH DAILY. 90 tablet 3   torsemide  (DEMADEX ) 20 MG tablet TAKE 1.5 TABLETS BY MOUTH EVERY DAY 135 tablet 1   TRELEGY ELLIPTA  100-62.5-25 MCG/ACT AEPB INHALE 1 PUFF INTO THE LUNGS DAILY 60 each 2   XARELTO  15 MG TABS tablet TAKE 1 TABLET (15 MG TOTAL) BY MOUTH DAILY WITH SUPPER 30 tablet 8   metFORMIN  (GLUCOPHAGE ) 500 MG tablet TAKE 1 TABLET BY MOUTH EVERY DAY WITH BREAKFAST 90 tablet 0   Naphazoline HCl (CLEAR EYES OP) Place 2 drops into both  eyes daily as needed (irritation).     HYDROcodone -acetaminophen  (NORCO/VICODIN) 5-325 MG tablet Take 1 tablet by mouth every 4 (four) hours as needed for moderate pain (pain score 4-6) or severe pain (pain score 7-10). (Patient not taking: Reported on 01/26/2024) 30 tablet 0   No facility-administered medications prior to visit.    ROS Review of Systems  Constitutional:  Negative for appetite change, chills, diaphoresis, fatigue and fever.  HENT:  Positive for postnasal drip, rhinorrhea, sinus pressure and sinus pain. Negative for sore throat and tinnitus.   Eyes: Negative.   Respiratory:  Negative for cough, chest tightness,  shortness of breath and wheezing.   Cardiovascular:  Negative for chest pain, palpitations and leg swelling.  Gastrointestinal: Negative.  Negative for abdominal pain, constipation, diarrhea, nausea and vomiting.  Genitourinary: Negative.  Negative for difficulty urinating.  Musculoskeletal: Negative.  Negative for arthralgias and back pain.  Skin: Negative.   Neurological:  Negative for dizziness and weakness.  Hematological:  Negative for adenopathy. Does not bruise/bleed easily.  Psychiatric/Behavioral: Negative.      Objective:  BP 134/88 (BP Location: Left Arm, Patient Position: Sitting, Cuff Size: Normal)   Pulse 63   Temp 97.9 F (36.6 C) (Oral)   Resp 16   Ht 5' 7.5 (1.715 m)   Wt 166 lb 3.2 oz (75.4 kg)   SpO2 98%   BMI 25.65 kg/m   BP Readings from Last 3 Encounters:  01/30/24 134/88  01/26/24 138/78  01/10/24 (!) 142/96       Wt Readings from Last 3 Encounters:  01/30/24 166 lb 3.2 oz (75.4 kg)  01/26/24 163 lb 9.6 oz (74.2 kg)  01/10/24 170 lb (77.1 kg)    Physical Exam Constitutional:      General: She is not in acute distress.    Appearance: She is ill-appearing. She is not toxic-appearing or diaphoretic.  HENT:     Mouth/Throat:     Mouth: Mucous membranes are moist.   Eyes:     General: No scleral icterus.    Conjunctiva/sclera: Conjunctivae normal.    Cardiovascular:     Rate and Rhythm: Normal rate. Rhythm irregularly irregular.     Heart sounds: Murmur heard.     Systolic murmur is present with a grade of 1/6.     No friction rub. No gallop.  Pulmonary:     Effort: Pulmonary effort is normal.     Breath sounds: No stridor. No wheezing, rhonchi or rales.  Abdominal:     General: Abdomen is flat.     Palpations: There is no mass.     Tenderness: There is no abdominal tenderness. There is no guarding.     Hernia: No hernia is present.   Musculoskeletal:        General: Normal range of motion.     Cervical back: Neck supple.      Right lower leg: No edema.     Left lower leg: No edema.  Lymphadenopathy:     Cervical: No cervical adenopathy.   Skin:    General: Skin is warm and dry.     Findings: No rash.   Neurological:     General: No focal deficit present.     Mental Status: She is alert. Mental status is at baseline.   Psychiatric:        Mood and Affect: Mood normal.        Behavior: Behavior normal.     Lab Results  Component Value Date  WBC 5.7 01/02/2024   HGB 15.0 01/02/2024   HCT 46.7 (H) 01/02/2024   PLT 125 (L) 01/02/2024   GLUCOSE 100 (H) 01/30/2024   CHOL 155 01/30/2024   TRIG 79.0 01/30/2024   HDL 43.30 01/30/2024   LDLCALC 96 01/30/2024   ALT 14 01/30/2024   AST 17 01/30/2024   NA 142 01/30/2024   K 4.1 01/30/2024   CL 110 01/30/2024   CREATININE 1.15 01/30/2024   BUN 26 (H) 01/30/2024   CO2 23 01/30/2024   TSH 1.27 01/30/2024   INR 1.6 (H) 12/08/2020   HGBA1C 5.6 01/30/2024   MICROALBUR 40.3 (H) 01/30/2024    No results found.  Assessment & Plan:  Persistent atrial fibrillation (HCC) -     TSH; Future  Thrombocytopenia (HCC)- No B/B. -     Vitamin B12; Future -     Folate; Future  Controlled type 2 diabetes mellitus without complication, without long-term current use of insulin  (HCC) -     Lipid panel; Future  Type 2 diabetes mellitus with hyperlipidemia (HCC)- A1C is down to 5.6%. Will discontinue metformin . -     Hemoglobin A1c; Future -     Microalbumin / creatinine urine ratio; Future -     Basic metabolic panel with GFR; Future -     Ambulatory referral to Ophthalmology -     HM Diabetes Foot Exam  Essential hypertension- BP is well controlled. -     TSH; Future -     Urinalysis, Routine w reflex microscopic; Future -     Basic metabolic panel with GFR; Future  Dyslipidemia, goal LDL below 70 -     Lipid panel; Future -     TSH; Future -     Hepatic function panel; Future  Acute non-recurrent pansinusitis -     Cefpodoxime Proxetil; Take 1 tablet  (200 mg total) by mouth 2 (two) times daily for 10 days.  Dispense: 20 tablet; Refill: 0  Stage 3a chronic kidney disease (HCC) -     Basic metabolic panel with GFR; Future  Encounter for general adult medical examination with abnormal findings- Exam completed, labs reviewed, vaccines reviewed, no cancer screenings indicated, pt ed material was given.      Follow-up: Return in about 6 months (around 07/31/2024).  Sandra Crouch, MD

## 2024-02-01 DIAGNOSIS — J0140 Acute pansinusitis, unspecified: Secondary | ICD-10-CM | POA: Insufficient documentation

## 2024-02-01 DIAGNOSIS — E785 Hyperlipidemia, unspecified: Secondary | ICD-10-CM | POA: Insufficient documentation

## 2024-02-01 DIAGNOSIS — Z0001 Encounter for general adult medical examination with abnormal findings: Secondary | ICD-10-CM | POA: Insufficient documentation

## 2024-02-02 ENCOUNTER — Ambulatory Visit: Payer: Self-pay | Admitting: Internal Medicine

## 2024-02-05 ENCOUNTER — Encounter (HOSPITAL_COMMUNITY): Admitting: Internal Medicine

## 2024-02-20 DIAGNOSIS — S82841D Displaced bimalleolar fracture of right lower leg, subsequent encounter for closed fracture with routine healing: Secondary | ICD-10-CM | POA: Diagnosis not present

## 2024-02-29 NOTE — Telephone Encounter (Signed)
 Spoke with patients daughter (okay per patient) regarding surgical clearance. She wanted to know what the recommendations from our clinic (Dr. Cherrie) was. Made her aware of the below.    She will call podiatry and see about surgical options. Advised her that if they need any info from us  to let us  know and we can fax over OV notes as below.   Patients daughter verbalized understanding.

## 2024-03-05 ENCOUNTER — Emergency Department (HOSPITAL_COMMUNITY)

## 2024-03-05 ENCOUNTER — Observation Stay (HOSPITAL_COMMUNITY)
Admission: EM | Admit: 2024-03-05 | Discharge: 2024-03-06 | Disposition: A | Discharge status: Home / Self Care | Attending: Emergency Medicine | Admitting: Emergency Medicine

## 2024-03-05 ENCOUNTER — Other Ambulatory Visit: Payer: Self-pay

## 2024-03-05 ENCOUNTER — Encounter (HOSPITAL_COMMUNITY): Payer: Self-pay

## 2024-03-05 DIAGNOSIS — N183 Chronic kidney disease, stage 3 unspecified: Secondary | ICD-10-CM | POA: Diagnosis present

## 2024-03-05 DIAGNOSIS — E1122 Type 2 diabetes mellitus with diabetic chronic kidney disease: Secondary | ICD-10-CM | POA: Diagnosis not present

## 2024-03-05 DIAGNOSIS — Z79899 Other long term (current) drug therapy: Secondary | ICD-10-CM | POA: Insufficient documentation

## 2024-03-05 DIAGNOSIS — K7689 Other specified diseases of liver: Secondary | ICD-10-CM | POA: Diagnosis not present

## 2024-03-05 DIAGNOSIS — I2510 Atherosclerotic heart disease of native coronary artery without angina pectoris: Secondary | ICD-10-CM | POA: Insufficient documentation

## 2024-03-05 DIAGNOSIS — J449 Chronic obstructive pulmonary disease, unspecified: Secondary | ICD-10-CM | POA: Diagnosis not present

## 2024-03-05 DIAGNOSIS — Z9889 Other specified postprocedural states: Secondary | ICD-10-CM | POA: Insufficient documentation

## 2024-03-05 DIAGNOSIS — I130 Hypertensive heart and chronic kidney disease with heart failure and stage 1 through stage 4 chronic kidney disease, or unspecified chronic kidney disease: Secondary | ICD-10-CM | POA: Insufficient documentation

## 2024-03-05 DIAGNOSIS — I517 Cardiomegaly: Secondary | ICD-10-CM | POA: Diagnosis not present

## 2024-03-05 DIAGNOSIS — I4819 Other persistent atrial fibrillation: Secondary | ICD-10-CM | POA: Diagnosis not present

## 2024-03-05 DIAGNOSIS — I509 Heart failure, unspecified: Secondary | ICD-10-CM

## 2024-03-05 DIAGNOSIS — R531 Weakness: Secondary | ICD-10-CM | POA: Insufficient documentation

## 2024-03-05 DIAGNOSIS — J811 Chronic pulmonary edema: Secondary | ICD-10-CM | POA: Diagnosis not present

## 2024-03-05 DIAGNOSIS — R112 Nausea with vomiting, unspecified: Secondary | ICD-10-CM | POA: Diagnosis not present

## 2024-03-05 DIAGNOSIS — R61 Generalized hyperhidrosis: Secondary | ICD-10-CM | POA: Diagnosis not present

## 2024-03-05 DIAGNOSIS — F17210 Nicotine dependence, cigarettes, uncomplicated: Secondary | ICD-10-CM | POA: Insufficient documentation

## 2024-03-05 DIAGNOSIS — Z951 Presence of aortocoronary bypass graft: Secondary | ICD-10-CM | POA: Diagnosis not present

## 2024-03-05 DIAGNOSIS — J4489 Other specified chronic obstructive pulmonary disease: Secondary | ICD-10-CM | POA: Insufficient documentation

## 2024-03-05 DIAGNOSIS — E119 Type 2 diabetes mellitus without complications: Secondary | ICD-10-CM

## 2024-03-05 DIAGNOSIS — R0602 Shortness of breath: Secondary | ICD-10-CM | POA: Diagnosis not present

## 2024-03-05 DIAGNOSIS — I5043 Acute on chronic combined systolic (congestive) and diastolic (congestive) heart failure: Secondary | ICD-10-CM | POA: Diagnosis not present

## 2024-03-05 DIAGNOSIS — Z950 Presence of cardiac pacemaker: Secondary | ICD-10-CM | POA: Diagnosis not present

## 2024-03-05 DIAGNOSIS — I2583 Coronary atherosclerosis due to lipid rich plaque: Secondary | ICD-10-CM | POA: Diagnosis not present

## 2024-03-05 DIAGNOSIS — Z7901 Long term (current) use of anticoagulants: Secondary | ICD-10-CM | POA: Insufficient documentation

## 2024-03-05 DIAGNOSIS — R109 Unspecified abdominal pain: Secondary | ICD-10-CM | POA: Diagnosis not present

## 2024-03-05 DIAGNOSIS — N1832 Chronic kidney disease, stage 3b: Secondary | ICD-10-CM | POA: Insufficient documentation

## 2024-03-05 DIAGNOSIS — I10 Essential (primary) hypertension: Secondary | ICD-10-CM | POA: Diagnosis present

## 2024-03-05 LAB — I-STAT VENOUS BLOOD GAS, ED
Acid-base deficit: 6 mmol/L — ABNORMAL HIGH (ref 0.0–2.0)
Bicarbonate: 18.7 mmol/L — ABNORMAL LOW (ref 20.0–28.0)
Calcium, Ion: 1.2 mmol/L (ref 1.15–1.40)
HCT: 52 % — ABNORMAL HIGH (ref 36.0–46.0)
Hemoglobin: 17.7 g/dL — ABNORMAL HIGH (ref 12.0–15.0)
O2 Saturation: 73 %
Potassium: 3.4 mmol/L — ABNORMAL LOW (ref 3.5–5.1)
Sodium: 145 mmol/L (ref 135–145)
TCO2: 20 mmol/L — ABNORMAL LOW (ref 22–32)
pCO2, Ven: 35.1 mmHg — ABNORMAL LOW (ref 44–60)
pH, Ven: 7.334 (ref 7.25–7.43)
pO2, Ven: 41 mmHg (ref 32–45)

## 2024-03-05 LAB — COMPREHENSIVE METABOLIC PANEL WITH GFR
ALT: 21 U/L (ref 0–44)
AST: 25 U/L (ref 15–41)
Albumin: 3.8 g/dL (ref 3.5–5.0)
Alkaline Phosphatase: 116 U/L (ref 38–126)
Anion gap: 14 (ref 5–15)
BUN: 28 mg/dL — ABNORMAL HIGH (ref 8–23)
CO2: 18 mmol/L — ABNORMAL LOW (ref 22–32)
Calcium: 9.7 mg/dL (ref 8.9–10.3)
Chloride: 111 mmol/L (ref 98–111)
Creatinine, Ser: 1.41 mg/dL — ABNORMAL HIGH (ref 0.44–1.00)
GFR, Estimated: 38 mL/min — ABNORMAL LOW (ref >60)
Glucose, Bld: 147 mg/dL — ABNORMAL HIGH (ref 70–99)
Potassium: 3.5 mmol/L (ref 3.5–5.1)
Sodium: 143 mmol/L (ref 135–145)
Total Bilirubin: 1.4 mg/dL — ABNORMAL HIGH (ref 0.0–1.2)
Total Protein: 7.6 g/dL (ref 6.5–8.1)

## 2024-03-05 LAB — BRAIN NATRIURETIC PEPTIDE: B Natriuretic Peptide: 557.3 pg/mL — ABNORMAL HIGH (ref 0.0–100.0)

## 2024-03-05 LAB — CBC WITH DIFFERENTIAL/PLATELET
Abs Immature Granulocytes: 0.03 K/uL (ref 0.00–0.07)
Basophils Absolute: 0.1 K/uL (ref 0.0–0.1)
Basophils Relative: 1 %
Eosinophils Absolute: 0.1 K/uL (ref 0.0–0.5)
Eosinophils Relative: 1 %
HCT: 51.4 % — ABNORMAL HIGH (ref 36.0–46.0)
Hemoglobin: 17 g/dL — ABNORMAL HIGH (ref 12.0–15.0)
Immature Granulocytes: 0 %
Lymphocytes Relative: 36 %
Lymphs Abs: 2.9 K/uL (ref 0.7–4.0)
MCH: 30.5 pg (ref 26.0–34.0)
MCHC: 33.1 g/dL (ref 30.0–36.0)
MCV: 92.3 fL (ref 80.0–100.0)
Monocytes Absolute: 0.6 K/uL (ref 0.1–1.0)
Monocytes Relative: 7 %
Neutro Abs: 4.4 K/uL (ref 1.7–7.7)
Neutrophils Relative %: 55 %
Platelets: 131 K/uL — ABNORMAL LOW (ref 150–400)
RBC: 5.57 MIL/uL — ABNORMAL HIGH (ref 3.87–5.11)
RDW: 17.4 % — ABNORMAL HIGH (ref 11.5–15.5)
WBC: 8 K/uL (ref 4.0–10.5)
nRBC: 0 % (ref 0.0–0.2)

## 2024-03-05 LAB — TROPONIN I (HIGH SENSITIVITY): Troponin I (High Sensitivity): 40 ng/L — ABNORMAL HIGH (ref <18)

## 2024-03-05 LAB — TYPE AND SCREEN
ABO/RH(D): A POS
Antibody Screen: NEGATIVE

## 2024-03-05 LAB — I-STAT CHEM 8, ED
BUN: 30 mg/dL — ABNORMAL HIGH (ref 8–23)
Calcium, Ion: 1.21 mmol/L (ref 1.15–1.40)
Chloride: 112 mmol/L — ABNORMAL HIGH (ref 98–111)
Creatinine, Ser: 1.3 mg/dL — ABNORMAL HIGH (ref 0.44–1.00)
Glucose, Bld: 139 mg/dL — ABNORMAL HIGH (ref 70–99)
HCT: 53 % — ABNORMAL HIGH (ref 36.0–46.0)
Hemoglobin: 18 g/dL — ABNORMAL HIGH (ref 12.0–15.0)
Potassium: 3.4 mmol/L — ABNORMAL LOW (ref 3.5–5.1)
Sodium: 145 mmol/L (ref 135–145)
TCO2: 18 mmol/L — ABNORMAL LOW (ref 22–32)

## 2024-03-05 LAB — I-STAT CG4 LACTIC ACID, ED: Lactic Acid, Venous: 2.8 mmol/L (ref 0.5–1.9)

## 2024-03-05 LAB — CBG MONITORING, ED: Glucose-Capillary: 151 mg/dL — ABNORMAL HIGH (ref 70–99)

## 2024-03-05 LAB — LIPASE, BLOOD: Lipase: 26 U/L (ref 11–51)

## 2024-03-05 MED ORDER — ONDANSETRON HCL 4 MG/2ML IJ SOLN
4.0000 mg | Freq: Once | INTRAMUSCULAR | Status: AC
  Administered 2024-03-05: 4 mg via INTRAVENOUS
  Filled 2024-03-05: qty 2

## 2024-03-05 NOTE — ED Provider Notes (Signed)
 [place] [facility] AT [facility] Provider Note   CSN: [id number] Arrival date & time: 03/05/24  2218     Patient presents with: Emesis   [name] [name] is a 78 y.o. female.  {Add pertinent medical, surgical, social history, OB history to HPI:32947} HPI      78 year old female with a history of hypertension, former tobacco abuse, atrial fibrillation on Xarelto , coronary artery disease status post CABG x 2, chronic combined congestive heart failure with ischemic cardiomyopathy previously on milrinone , hyperlipidemia, Biotronik pacemaker, history of bleeding duodenal ulcer,  Past Medical History:  Diagnosis Date   (HFimpEF) heart failure with improved ejection fraction (HCC)    a. 07/2018 Echo: EF 30-35%; b. 11/2019 Echo: EF 55-60%, no rwma, Gr2 DD, Nl RV size/fxn. Mild BAE. Mild MR/AI.   Arthritis    Asthma    Atopic dermatitis    CAD (coronary artery disease)    a. 2016 s/p CABG x 2 (LIMA->LAD, VG->OM); b. 08/2018 MV: EF 44%, no ischemia/infact.   Cardiac arrest (HCC) 10/2014   Cardiomyopathy, ischemic    a. 07/2018 Echo: EF 30-35%; 11/2019 Echo: EF 55-60%.   Carotid arterial disease (HCC)    a. 11/2019 Carotid U/S   CHF (congestive heart failure) (HCC)    CKD (chronic kidney disease), stage III (HCC)    COPD (chronic obstructive pulmonary disease) (HCC)    Diabetes mellitus without complication (HCC)    Dysrhythmia    Esophageal dilatation 2013   GERD (gastroesophageal reflux disease)    Glaucoma    Gout    Headache    Hearing loss    History of blood transfusion    Hypertension    Hypokalemia    Idiopathic angioedema    LGI bleed 08/06/2017   a. felt to be hemorrhoidal during that admission (no drop in Hgb).   Lower back pain    Paroxysmal atrial fibrillation (HCC)    a. Dx 2016-->h/o difficult to control rates (complicated by noncompliance), not felt to be a candidate for ablation or antiarrhythmic due to noncompliance; b. Recurrent AF 2021 -  converted w/ IV dilt; c. CHA2DS2VASc = 7-->Xarelto .   Personal history of noncompliance with medical treatment, presenting hazards to health    Pneumonia    Prediabetes    Presence of permanent cardiac pacemaker    Right leg numbness    S/P CABG x 2 with clipping of LA appendage 11/14/2014   LIMA to LAD, SVG to OM, EVH via right thigh   Urine incontinence    Uterine fibroid     Prior to Admission medications   Medication Sig Start Date End Date Taking? Authorizing Provider  acetaminophen  (TYLENOL ) 500 MG tablet Take 500 mg by mouth as needed for mild pain (pain score 1-3) or moderate pain (pain score 4-6).    [provider]  Albuterol -Budesonide  (AIRSUPRA ) 90-80 MCG/ACT AERO Inhale 2 puffs into the lungs every 4 (four) hours as needed. 01/10/24   Sagardia, Miguel Jose, MD  Alcohol  Swabs  (ALCOHOL  PADS) 70 % PADS 1 Act by Does not apply route 2 (two) times daily. 04/28/23   Joshua Debby CROME, MD  allopurinol  (ZYLOPRIM ) 100 MG tablet Take 1 tablet (100 mg total) by mouth daily. NEEDS FOLLOW UP APPOINTMENT FOR MORE REFILLS 11/28/23   Bensimhon, Toribio SAUNDERS, MD  Aromatic Inhalants (VICKS VAPOR INHALER IN) Inhale into the lungs as needed.    [provider]  atorvastatin  (LIPITOR ) 10 MG tablet TAKE 1 TABLET BY MOUTH EVERYDAY AT  BEDTIME 07/22/23   Joshua Debby CROME, MD  Blood Glucose Monitoring Suppl (ONE TOUCH ULTRA 2) w/Device KIT 1 Act by Does not apply route 2 (two) times daily. 04/28/23   Joshua Debby CROME, MD  gabapentin  (NEURONTIN ) 100 MG capsule TAKE 1 CAPSULE BY MOUTH EVERYDAY AT BEDTIME 01/15/24   Gershon Donnice SAUNDERS, DPM  hydrALAZINE  (APRESOLINE ) 25 MG tablet TAKE 2 TABLETS (50 MG TOTAL) BY MOUTH 3 (THREE) TIMES DAILY. 09/22/23   Bensimhon, Toribio SAUNDERS, MD  losartan  (COZAAR ) 100 MG tablet Take 1 tablet (100 mg total) by mouth daily. 12/22/23   Alvia Corean CROME, FNP  ONETOUCH ULTRA test strip 1 each by Other route 2 (two) times daily. as directed 04/28/23   Joshua Debby CROME, MD  pantoprazole   (PROTONIX ) 40 MG tablet TAKE 1 TABLET BY MOUTH EVERY DAY 01/23/24   Joshua Debby CROME, MD  spironolactone  (ALDACTONE ) 25 MG tablet TAKE 1 TABLET (25 MG TOTAL) BY MOUTH DAILY. 12/19/23   Bensimhon, Toribio SAUNDERS, MD  torsemide  (DEMADEX ) 20 MG tablet TAKE 1.5 TABLETS BY MOUTH EVERY DAY 11/28/23   Waddell Danelle ORN, MD  TRELEGY ELLIPTA  100-62.5-25 MCG/ACT AEPB INHALE 1 PUFF INTO THE LUNGS DAILY 01/24/24   Joshua Debby CROME, MD  XARELTO  15 MG TABS tablet TAKE 1 TABLET (15 MG TOTAL) BY MOUTH DAILY WITH SUPPER 01/09/24   Bensimhon, Toribio SAUNDERS, MD  losartan  (COZAAR ) 50 MG tablet Take 1 tablet (50 mg total) by mouth daily. 12/16/22   Bensimhon, Toribio SAUNDERS, MD    Allergies: Bee venom, Ivp dye [iodinated contrast media], Ace inhibitors, Atenolol, Codeine, Entresto  [sacubitril -valsartan ], Isosorbide , Shrimp [shellfish allergy], Simvastatin, Jardiance [empagliflozin], and Penicillin g    Review of Systems  Updated Vital Signs BP (!) 190/92 (BP Location: Right Arm)   Pulse 72   Temp 97.9 F (36.6 C) (Oral)   Resp 19   SpO2 92%   Physical Exam  (all labs ordered are listed, but only abnormal results are displayed) Labs Reviewed  CBG MONITORING, ED - Abnormal; Notable for the following components:      Result Value   Glucose-Capillary 151 (*)    All other components within normal limits  CBC WITH DIFFERENTIAL/PLATELET  COMPREHENSIVE METABOLIC PANEL WITH GFR  BRAIN NATRIURETIC PEPTIDE  LIPASE, BLOOD  I-STAT CHEM 8, ED  I-STAT CG4 LACTIC ACID, ED  I-STAT VENOUS BLOOD GAS, ED  TROPONIN I (HIGH SENSITIVITY)    EKG: None  Radiology: No results found.  {Document cardiac monitor, telemetry assessment procedure when appropriate:32947} Procedures   Medications Ordered in the ED - No data to display    {Click here for ABCD2, HEART and other calculators REFRESH Note before signing:1}                              Medical Decision Making Amount and/or Complexity of Data Reviewed Labs: ordered. Radiology:  ordered.   ***  {Document critical care time when appropriate  Document review of labs and clinical decision tools ie CHADS2VASC2, etc  Document your independent review of radiology images and any outside records  Document your discussion with family members, caretakers and with consultants  Document social determinants of health affecting pt's care  Document your decision making why or why not admission, treatments were needed:32947:::1}   Final diagnoses:  None    ED Discharge Orders     None

## 2024-03-05 NOTE — ED Triage Notes (Signed)
 Patient BIB EMS for evaluation of vomiting. Patient reports she is unable to keep any food or drink down. Patient diaphoretic on arrival. EMS reports patient had pacemaker which has been firing for several beats and then stopping. Patient denies SOB or chest pain.

## 2024-03-06 ENCOUNTER — Other Ambulatory Visit (HOSPITAL_COMMUNITY): Payer: Self-pay

## 2024-03-06 ENCOUNTER — Encounter (HOSPITAL_COMMUNITY): Payer: Self-pay | Admitting: Family Medicine

## 2024-03-06 ENCOUNTER — Inpatient Hospital Stay (HOSPITAL_COMMUNITY)

## 2024-03-06 ENCOUNTER — Emergency Department (HOSPITAL_COMMUNITY)

## 2024-03-06 DIAGNOSIS — N1831 Chronic kidney disease, stage 3a: Secondary | ICD-10-CM

## 2024-03-06 DIAGNOSIS — J811 Chronic pulmonary edema: Secondary | ICD-10-CM | POA: Diagnosis not present

## 2024-03-06 DIAGNOSIS — I509 Heart failure, unspecified: Secondary | ICD-10-CM

## 2024-03-06 DIAGNOSIS — I429 Cardiomyopathy, unspecified: Secondary | ICD-10-CM

## 2024-03-06 DIAGNOSIS — N1832 Chronic kidney disease, stage 3b: Secondary | ICD-10-CM

## 2024-03-06 DIAGNOSIS — I5023 Acute on chronic systolic (congestive) heart failure: Secondary | ICD-10-CM

## 2024-03-06 DIAGNOSIS — E119 Type 2 diabetes mellitus without complications: Secondary | ICD-10-CM | POA: Diagnosis not present

## 2024-03-06 DIAGNOSIS — I5043 Acute on chronic combined systolic (congestive) and diastolic (congestive) heart failure: Secondary | ICD-10-CM | POA: Diagnosis present

## 2024-03-06 DIAGNOSIS — I10 Essential (primary) hypertension: Secondary | ICD-10-CM | POA: Diagnosis not present

## 2024-03-06 DIAGNOSIS — J449 Chronic obstructive pulmonary disease, unspecified: Secondary | ICD-10-CM | POA: Diagnosis not present

## 2024-03-06 DIAGNOSIS — I4819 Other persistent atrial fibrillation: Secondary | ICD-10-CM | POA: Diagnosis not present

## 2024-03-06 DIAGNOSIS — I2510 Atherosclerotic heart disease of native coronary artery without angina pectoris: Secondary | ICD-10-CM

## 2024-03-06 DIAGNOSIS — I340 Nonrheumatic mitral (valve) insufficiency: Secondary | ICD-10-CM | POA: Diagnosis not present

## 2024-03-06 DIAGNOSIS — I2583 Coronary atherosclerosis due to lipid rich plaque: Secondary | ICD-10-CM | POA: Diagnosis not present

## 2024-03-06 DIAGNOSIS — R109 Unspecified abdominal pain: Secondary | ICD-10-CM | POA: Diagnosis not present

## 2024-03-06 LAB — BASIC METABOLIC PANEL WITH GFR
Anion gap: 13 (ref 5–15)
BUN: 24 mg/dL — ABNORMAL HIGH (ref 8–23)
CO2: 15 mmol/L — ABNORMAL LOW (ref 22–32)
Calcium: 9.4 mg/dL (ref 8.9–10.3)
Chloride: 114 mmol/L — ABNORMAL HIGH (ref 98–111)
Creatinine, Ser: 1.31 mg/dL — ABNORMAL HIGH (ref 0.44–1.00)
GFR, Estimated: 41 mL/min — ABNORMAL LOW (ref >60)
Glucose, Bld: 115 mg/dL — ABNORMAL HIGH (ref 70–99)
Potassium: 4.6 mmol/L (ref 3.5–5.1)
Sodium: 142 mmol/L (ref 135–145)

## 2024-03-06 LAB — MAGNESIUM: Magnesium: 1.9 mg/dL (ref 1.7–2.4)

## 2024-03-06 LAB — I-STAT CG4 LACTIC ACID, ED
Lactic Acid, Venous: 2.3 mmol/L (ref 0.5–1.9)
Lactic Acid, Venous: 2.4 mmol/L (ref 0.5–1.9)
Lactic Acid, Venous: 1.8 mmol/L (ref 0.5–1.9)

## 2024-03-06 LAB — PROTIME-INR
INR: 1.9 — ABNORMAL HIGH (ref 0.8–1.2)
Prothrombin Time: 22.6 s — ABNORMAL HIGH (ref 11.4–15.2)

## 2024-03-06 LAB — GLUCOSE, CAPILLARY: Glucose-Capillary: 133 mg/dL — ABNORMAL HIGH (ref 70–99)

## 2024-03-06 LAB — AMYLASE: Amylase: 32 U/L (ref 28–100)

## 2024-03-06 LAB — MRSA NEXT GEN BY PCR, NASAL: MRSA by PCR Next Gen: NOT DETECTED

## 2024-03-06 LAB — D-DIMER, QUANTITATIVE: D-Dimer, Quant: 0.3 ug{FEU}/mL (ref 0.00–0.50)

## 2024-03-06 LAB — LACTIC ACID, PLASMA: Lactic Acid, Venous: 2.3 mmol/L (ref 0.5–1.9)

## 2024-03-06 LAB — TROPONIN I (HIGH SENSITIVITY): Troponin I (High Sensitivity): 45 ng/L — ABNORMAL HIGH (ref <18)

## 2024-03-06 LAB — DIGOXIN LEVEL: Digoxin Level: <0.4 ng/mL — ABNORMAL LOW (ref 0.8–2.0)

## 2024-03-06 SURGERY — RIGHT HEART CATH
Anesthesia: LOCAL

## 2024-03-06 MED ORDER — MILRINONE LACTATE IN DEXTROSE 20-5 MG/100ML-% IV SOLN
0.1250 ug/kg/min | INTRAVENOUS | Status: DC
  Administered 2024-03-06: 0.125 ug/kg/min via INTRAVENOUS
  Filled 2024-03-06: qty 100

## 2024-03-06 MED ORDER — ACETAMINOPHEN 325 MG PO TABS: 650.0000 mg | ORAL_TABLET | Freq: Four times a day (QID) | ORAL | Status: DC | PRN

## 2024-03-06 MED ORDER — LOSARTAN POTASSIUM 50 MG PO TABS
100.0000 mg | ORAL_TABLET | Freq: Every day | ORAL | Status: DC
  Administered 2024-03-06: 100 mg via ORAL
  Filled 2024-03-06: qty 2

## 2024-03-06 MED ORDER — BUDESON-GLYCOPYRROL-FORMOTEROL 160-9-4.8 MCG/ACT IN AERO
2.0000 | INHALATION_SPRAY | Freq: Two times a day (BID) | RESPIRATORY_TRACT | Status: DC
  Filled 2024-03-06: qty 5.9

## 2024-03-06 MED ORDER — SPIRONOLACTONE 25 MG PO TABS
25.0000 mg | ORAL_TABLET | Freq: Every day | ORAL | Status: DC
  Administered 2024-03-06: 25 mg via ORAL
  Filled 2024-03-06: qty 1

## 2024-03-06 MED ORDER — ONDANSETRON HCL 4 MG PO TABS
4.0000 mg | ORAL_TABLET | Freq: Three times a day (TID) | ORAL | 0 refills | Status: DC | PRN
  Filled 2024-03-06: qty 20, 7d supply, fill #0

## 2024-03-06 MED ORDER — GABAPENTIN 100 MG PO CAPS: 100.0000 mg | ORAL_CAPSULE | Freq: Every day | ORAL | Status: DC

## 2024-03-06 MED ORDER — INSULIN ASPART 100 UNIT/ML IJ SOLN: 0.0000 [IU] | Freq: Every day | INTRAMUSCULAR | Status: DC

## 2024-03-06 MED ORDER — MAGNESIUM SULFATE 2 GM/50ML IV SOLN
2.0000 g | Freq: Once | INTRAVENOUS | Status: AC
  Administered 2024-03-06: 2 g via INTRAVENOUS
  Filled 2024-03-06: qty 50

## 2024-03-06 MED ORDER — ACETAMINOPHEN 650 MG RE SUPP: 650.0000 mg | Freq: Four times a day (QID) | RECTAL | Status: DC | PRN

## 2024-03-06 MED ORDER — RIVAROXABAN 15 MG PO TABS
15.0000 mg | ORAL_TABLET | Freq: Every day | ORAL | Status: DC
Start: 2024-03-06 — End: 2024-03-06
  Filled 2024-03-06: qty 1

## 2024-03-06 MED ORDER — INSULIN ASPART 100 UNIT/ML IJ SOLN
0.0000 [IU] | Freq: Three times a day (TID) | INTRAMUSCULAR | Status: DC
  Administered 2024-03-06: 3 [IU] via SUBCUTANEOUS

## 2024-03-06 MED ORDER — ONDANSETRON HCL 4 MG/2ML IJ SOLN: 4.0000 mg | Freq: Four times a day (QID) | INTRAMUSCULAR | Status: DC | PRN

## 2024-03-06 MED ORDER — ATORVASTATIN CALCIUM 10 MG PO TABS: 10.0000 mg | ORAL_TABLET | Freq: Every day | ORAL | Status: DC

## 2024-03-06 MED ORDER — FUROSEMIDE 10 MG/ML IJ SOLN
80.0000 mg | Freq: Once | INTRAMUSCULAR | Status: AC
  Administered 2024-03-06: 80 mg via INTRAVENOUS

## 2024-03-06 MED ORDER — ORAL CARE MOUTH RINSE
15.0000 mL | OROMUCOSAL | Status: DC | PRN
Start: 2024-03-06 — End: 2024-03-06

## 2024-03-06 MED ORDER — PANTOPRAZOLE SODIUM 40 MG PO TBEC
40.0000 mg | DELAYED_RELEASE_TABLET | Freq: Every day | ORAL | Status: DC
  Administered 2024-03-06: 40 mg via ORAL
  Filled 2024-03-06: qty 1

## 2024-03-06 MED ORDER — HYDRALAZINE HCL 50 MG PO TABS
75.0000 mg | ORAL_TABLET | Freq: Three times a day (TID) | ORAL | Status: DC
  Administered 2024-03-06: 75 mg via ORAL
  Filled 2024-03-06: qty 3

## 2024-03-06 MED ORDER — MILRINONE LACTATE IN DEXTROSE 20-5 MG/100ML-% IV SOLN
0.1250 ug/kg/min | INTRAVENOUS | Status: DC
  Filled 2024-03-06: qty 100

## 2024-03-06 MED ORDER — ONDANSETRON HCL 4 MG PO TABS: 4.0000 mg | ORAL_TABLET | Freq: Four times a day (QID) | ORAL | Status: DC | PRN

## 2024-03-06 MED ORDER — HYDRALAZINE HCL 25 MG PO TABS: 50.0000 mg | ORAL_TABLET | Freq: Three times a day (TID) | ORAL | Status: DC

## 2024-03-06 MED ORDER — ALLOPURINOL 100 MG PO TABS: 100.0000 mg | ORAL_TABLET | Freq: Every day | ORAL | Status: DC

## 2024-03-06 NOTE — Discharge Instructions (Signed)
 [employer] assistance programs Crisis assistance programs  -[facility]elessness Arts development officer. If you are experiencing homelessness in [place], [place] , your first point of contact should be Pensions consultant. You can reach Coordinated Entry by calling ([phone]) [phone] or by emailing [email or web address].  Community access points: [name] [phone] [address], [place]) every Tuesday from 9am-10am. [facility] ([address] [place]. [address], [place]) every Wednesday from 8am-9am.   -[place] Coordinated Re-entry Daniel [place]: Dial 211 and request. Offers referrals to homeless shelters in the area.    -The [employer] (972)335-5680) offers several services to local families, as funding allows. The Emergency Assistance Program (EAP), which they administer, provides household goods, free food, clothing, and financial aid to people in need in the [place]  area. The EAP program does have some qualification, and counselors will interview clients for financial assistance by written referral only. Referrals need to be made by the Department of Social Services or by other EAP approved human services agencies or charities in the area.  -Open Door Ministries of [employer], which can be reached at 617-073-3790, offers emergency assistance programs for those in need of help, such as food, rent assistance, a soup kitchen, shelter, and clothing. They are based in [facility]  but provide a number of services to those that qualify for assistance.   [facility] Department of Social Services may be able to offer temporary financial assistance and cash grants for paying rent and utilities, Help may be provided for local county residents who may be experiencing personal crisis when other resources, including government programs, are not available. Call [phone]  -High [employer] is a [employer] agency, The organization can offer emergency assistance for paying rent, Care[employer] Rx, utilities, food, household products and furniture. They offer extensive emergency and transitional housing for families, children and single women, and also run a Boy's and Dole Food. Thrift Shops, Secondary school teacher, and other aid offered too. [address], [place], [place]  [address], [phone]  -[place] Low Income Energy Assistance Program -- This is offered for [facility] families. The federal government created [employer] Program provides a one-time cash grant payment to help eligible low-income families pay their electric and heating bills. [address], [place], [place]  [address], [phone]  -[place] Emergency Assistance -- A program offers emergency utility and rent funds for greater [employer] area residents. The program can also provide counseling and referrals to charities and government programs. Also provides food and a free meal program that serves lunch Mondays - Saturdays and dinner seven days per week to individuals in the community. [address], [place], [place]  [address], [phone]  -[employer] - Offers affordable apartment and housing communities across      [place] and [place]. The low income and seniors can access public housing, rental assistance to qualified applicants, and apply for the section 8 rent subsidy program. Other programs include Chiropractor and Engineer, maintenance. [address], [place], [place]  [address], dial [phone].  -The [facility] provides transitional housing to veterans and the disabled. Clients will also access other services too, including assistance in applying for Disability, life skills classes, case management, and assistance in finding permanent housing. [address], [place], [place]  [place] [address], call [phone]  -[place] Transitional Housing through [facility] is for people who were just  evicted or that are formerly homeless. The non-profit will also help then gain self-sufficiency, find a home or apartment to live in, and also provides information on rent assistance when needed. Phone [phone]  -The [place] [employer] [facility] helps low income, elderly, or disabled residents in seven counties in the [place] ([place], [place], [place], [employer], [place], [place], [place], and [place]) save energy and reduce their utility bills by improving energy efficiency. Phone [phone].  -[employer] is located in the [place] [facility] in the [employer], [address], [place], [place] [address]. Parking is in the rear of the building. Phone: [phone]   General Email: info@gsohc .org  GHC provides free housing counseling assistance in locating affordable rental housing or housing with support services for families and individuals in crisis and the chronically homeless. We provide potential resources for other housing needs like utilities. Our trained counselors also work with clients on budgeting and financial literacy in effort to empower them to take control of their financial situations. [employer] collaborates with homeless service providers and other stakeholders as part of the [employer] COC (Continuum of Care). The (COC) is a regional/local planning body that coordinates housing and services funding for homeless families and individuals. The role of [facility] in the COC is through housing counseling to work with people we serve on diversion strategies for those that are at imminent risk of becoming homeless. We also work with the Coordinated Assessment/Entry Specialist who attempts to find temporary solutions and/or connects the people  to Housing First, Rapid Re-housing or transitional housing programs. Our Homelessness Prevention Housing Counselors meet with clients on business days (Monday-Fridays, except scheduled holidays) from 8:30 am to 4:30 pm.  Legal assistance for evictions, foreclosure, and more -If you need free legal advice on civil issues, such as foreclosures, evictions, Electronics engineer, government programs, domestic issues and more, Armed forces operational officer Aid of [place]  [facility]) is a Associate Professor firm that provides free legal services and counsel to lower income people, seniors, disabled, and others, The goal is to ensure everyone has access to justice and fair representation. Call them at [phone].  [facility] for Housing and Community Studies can provide info about obtaining legal assistance with evictions. Phone [phone].  Data processing manager  The [employer], [employer]. offers job and Dispensing optician. Resources are focused on helping students obtain the skills and experiences that are necessary to compete in today's challenging and tight job market. The non-profit faith-based community action agency offers internship trainings as well as classroom instruction. Classes are tailored to meet the needs of people in the [facility] region. [place], [place] [address], [phone]  Foreclosure prevention/Debt Services Family Services of the [employer] Credit Counseling Service inludes debt and foreclosure prevention programs for local families. This includes money management, financial advice, budget review and development of a written action plan with a Pensions consultant to help solve specific individual financial problems. In addition, housing and mortgage counselors can also provide pre- and post-purchase homeownership counseling, default resolution counseling (to prevent foreclosure) and reverse mortgage counseling. A Debt Management Program allows  people and families with a high level of credit card or medical debt to consolidate and repay consumer debt and loans to creditors and rebuild positive credit ratings and scores. Contact ([phone] D7650557.  Community clinics in [place] -Health Department [facility]: [address] [place], [place], [address]. [phone].  -Health Department [place] [facility]: [phone]  [address], [facility], [address]. [phone].  -[facility] offers medical care through a group of doctors, pharmacies and other healthcare related agencies that offer services for low income, uninsured adults in [place]. Also offers adult Dental care and assistance with applying for an [employer]. Call [phone].   [facility]. This center provides low-cost health care to those without health insurance. Services offered include an onsite pharmacy. Phone [phone]. [address], [address], [place].  -Medication Assistance Program serves as a link between pharmaceutical companies and patients to provide low cost or free prescription medications. This service is available for residents who meet certain income restrictions and have no insurance coverage. PLEASE CALL [phone] [place]) OR [phone] ([place])  -One Step Further: Materials engineer, The [employer] Support & Nutrition Program, [employer]. Call [phone]/ [phone].  Food pantry and assistance -Urban Ministry-Food Bank: [address], [place] [address]. Phone [phone]  -Blessed Table Food Pantry: [address] [place], [place] [address]. [phone].  -Missionary Ministry: has the purpose of visiting the sick and shut-ins and provide for needs in the surrounding communities. Call [phone]. Email: [email or web address] This program provides: Food box for seniors, Financial assistance, Food to meet basic  nutritional needs.  -Meals on Wheels with Senior Resources: [facility] residents age 48 and over who are homebound and unable to obtain and prepare a nutritious meal for themselves are eligible for this service. There may be a waiting list in certain parts of [facility] if the route in that area is full. If you are in [facility] and [place] call [phone] to register. For all other areas call [phone] to register.  -Greater Dietitian: https://findfood.[email or web address].[email or web address]  TRANSPORTATION: -[employer] Department of Health: Call [facility] and [employer] at [phone] for details. AttractionGuides.es  -Access [place]: Access [place] is the [employer] Agency's shared-ride transportation service for eligible riders who have a disability that prevents them from riding the fixed route bus. Call [phone]. Access [place] riders must pay a fare of $1.50 per trip, or may purchase a 10-ride punch card for $14.00 ($1.40 per ride) or a 40-ride punch card for $48.00 ($1.20 per ride).  -The Shepherd's WHEELS rideshare transportation service is provided for senior citizens (60+) who live independently within [place] city limits and are unable to drive or have limited access to transportation. Call [phone] to schedule an appointment.  -[place] Transportation: For Medicare or Medicaid recipients call 212-180-3799?SABRA Ambulance, wheelchair fleeta, and ambulatory quotes available.   FLEEING VIOLENCE: -Family Services of the [place]- 24/7 Crisis line ([phone])[phone]) -[facility] Justice Centers: (336) [phone]-[phone] [phone])  [place] 2-1-1 is another useful way to locate resources in the community. Visit ShedSizes.ch to find service information online. If you need additional assistance, 2-1-1 Referral Specialists are available 24 hours a day, every day by dialing  2-1-1 or [phone] from any phone. The call is free, confidential, and available in any language.  Affordable Housing Search http://www.nchousingsearch.[facility])   M-F 8a-3p [address] [place]  [place], [place] [address] [phone] Services include: laundry, barbering, support groups, case management, phone & computer access, showers, AA/NA mtgs, mental health/substance abuse nurse, job skills class, disability information, VA assistance, spiritual classes, etc. Winter Shelter available when temperatures are less than 32 degrees.   HOMELESS SHELTERS [facility] Night [facility]ter at [facility]- Call [phone] ext. [phone]  or ext. [phone]. Located at [address], [place], [place] [address]  [facility] Mens Shelter- Call [phone]. Located at [address], [place] [address].  [facility]- [employer]. Call [phone]. Office located at [address], [place] [address].  Pathways Family Housing through [place] [phone].  [facility] Family Shelter- Call [phone]. Located at [address] [place], [place], [place] [address].  Room at the Inn-For Pregnant mothers. Call [phone]. Located at [address] [place], [address].  [place] [facility] of Hope-For men in [place]. Call [phone]. [facility]-Shelter in [place]. Call [phone].  Home of [employer] for [employer] [phone]. Office located at [address] [place], [address].  [employer] be agreeable to help with chores. Call [phone] ext. [phone].  Men's: [address] [place], [place] [address]. Women's: [facility]  [address] [place], [place] [address]  Crisis Services [facility] [place] Crisis Management- [phone]  [facility] [address] [place], [place] [address]. Phone: [phone] Rent/Utility Assistance in  [facility]:  INNOVATIVE PATHWAYS [address], [place], [place] [address] [phone] Mon 8:00am - 6:00pm; Tue 8:00am - 6:00pm; Wed 8:00am - 6:00pm; Thu 8:00am - 6:00pm; Fri 8:00am - 6:00pm; Email: [email or web address] Eligibility: Residents of [place], [place], [place], [place], [place] and [place] that meet income limits. Call or text for eligibility screening.   [facility] MINISTRY [address] [place], [place], [place] [address] [phone] (Main: Rental Assistance) [phone] (Main: Utility Assistance) Mon 8:30am - 5:00pm; Tue 8:30am - 5:00pm; Wed 8:30am - 5:00pm; Thu 8:30am - 5:00pm; Fri 8:30am - 5:00pm; Website: http://www.greensborourbanministry.org/emergency-assistance-program Eligibility: People who have an unexpected crisis or emergency that can be verified. Must have some form of income and meet income limits. At the first of the month, only helps with rent/mortgage assistance for those who have court ordered eviction notices. Call for application information. Call for exact documents that will be needed. Examples of documents that may be needed: Photo ID, Social Security cards for everyone in the household, and proof of income for previous 2 months. Copy of eviction notice for rent assistance and copy of final notice for utility assistance. Statements or receipts of bills for previous 2 months.   [facility] - [place] [address], [place], [place] [address] [phone] (Main) [phone] (Alternate) Mon 9:00am - 5:00pm; Tue 9:00am - 5:00pm; Wed 9:00am - 5:00pm; Thu 9:00am - 5:00pm; Fri 9:00am - 5:00pm; Website: http://[email or web address].salvationarmy.org/[place]/emergency-financial-assistance Email: [email or web address] Eligibility: People experiencing a housing crisis with past-due rent and/or utilities and meet income limits. Must be willing to take part in 6 Call or visit website to download  application. Return complete application by mail or email only. Documents: Help with Utilities: Photo ID, proof of household income, copies of monthly bills or receipts, and a final disconnection/shut-off notice. Help with Rent or Mortgage: Photo ID, proof of income, copies of monthly bills or receipts, and eviction notice. Help with Household Goods: Photo ID, proof of household income, copies of monthly bills or receipts, and a fire or flood report.  [facility] [place] [address], [place], [place] [address] [phone] (Main) Mon 8:00am - 5:00pm; Tue 8:00am - 5:00pm; Wed 8:00am - 5:00pm; Thu 8:00am - 5:00pm; Fri 8:00am - 12:00pm; Website: http://[email or web address].salvationarmy.org/[place]/emergency-financial-assistance Email: [email or web address] Call for eligibility information. Apply :Utilities Assistance: Visit office by 8:30am on 1st and 4th Monday of each  month to pick up application. Rent and Mortgage Assistance: Visit office by 8:30am on 2nd and 3rd Monday of each month to pick up application. NOTE: If Monday falls on a holiday applications can be picked up the following Tuesday. Documents required will be listed on application.  [facility] [phone] (Main) Seen by appointment only. Call for more information. Eligibility: Meet income limits. Apply: Call for information on how to schedule an appointment. Each month there is a specific day to call to schedule an appointment. It is stated on the agency voicemail message. Appointments fill up quickly each month. Documents: Photo ID, copy of current utility bill.  [facility] HANDS [place] [address], [place], [place] [address] [phone] (Main) Tue 9:00am - 4:00pm; Wed 9:00am - 4:00pm; Thu 9:00am - 4:00pm; Website: http://www.[email or web address] Email: [email or web address] [email or web address] Eligibility: Utility Assistance: Meet income limits and be a Haematologist. [employer] customers do not qualify. Must not have received utility assistance for another agency within the last 90 days. Rent Assistance: Residents of [employer] who meet income limits. Must not have received rent assistance for another agency within the last 90 days. Apply: Call to schedule an appointment. Documents: Utility Assistance: Photo ID, City of [employer], copy of lease (if not paying a mortgage), proof of income, and monthly expenses. Rent Assistance: Photo ID, W-9 from the landlord, copy of the lease, proof of income, and a list of monthly expenses.  [facility] - [place] [address], [place], [place] [address] [phone] (Main: Help With Rent) [phone] (Main: Help With Utilities) Mon 9:00am - 4:00pm; Tue 9:00am - 4:00pm; Wed 9:00am - 4:00pm; Thu 9:00am - 4:00pm; Fri 9:00am - 4:00pm; Website: MotivationalSites.no Email: [email or web address] Eligibility: People experiencing a financial crisis. Apply: Call to schedule an appointment Wednesday, 7:30am. Documents: Photo ID, Social Security card, proof of income, and proof of address. Other documents may be required, depending on service. Call for more information.  LOW INCOME ENERGY ASSISTANCE PROGRAM DEPARTMENT OF SOCIAL SERVICES - [facility] [address], [place], [place] [address] [phone] (Main) Mon 8:00am - 5:00pm; Tue 8:00am - 5:00pm; Wed 8:00am - 5:00pm; Thu 8:00am - 5:00pm; Fri 8:00am - 5:00pm; Website: [email or web address] Eligibility: Meet income limits and resource guidelines. Each household is only eligible once, even if multiple members apply. Apply: Call to see if funds are available. Visit to complete an application, call to have 1 mailed, or apply online at [email or web address] NOTE: Households with a person age 27  and over or a person with a documented disability can apply beginning December 1. Other households can apply beginning January 1. Documents: Photo ID, birth certificate, proof of household income, copy of utility bill, latest bank statement, the names and Social Security numbers for everyone in the household, and proof of disability if under age 50.  LOW INCOME ENERGY ASSISTANCE PROGRAM DEPARTMENT OF SOCIAL SERVICES - [facility] [address] [place], [place], [place] [address] [phone] (Main) Mon 8:00am - 5:00pm; Tue 8:00am - 5:00pm; Wed 8:00am - 5:00pm; Thu 8:00am - 5:00pm; Fri 8:00am - 5:00pm; Website: [email or web address] Eligibility: Meet income limits and resource guidelines. Each household is only eligible once, even if multiple members apply. Apply: Call to see if funds are available. Visit to complete an application, call to have 1 mailed, or apply online at [email or web address] NOTE: Households with a person age 63 and over or a person with a documented  disability can apply beginning December 1. Other households can apply beginning January 1. Documents: Photo ID, birth certificate, proof of household income, copy of utility bill, latest bank statement, the names and Social Security numbers for everyone in the household, and proof of disability if under age 54.  Homeless Shelter List:  Health and safety inspector [facility]) [address] [place], [place]  Phone: [phone]  [facility] [address] [place], [place] [address] Phone: [phone]  [facility] (Women only) [address] [place], [place] [address] Phone: [phone]  [facility] [address], [place] [address] Phone: [phone]  [facility]: [phone]. [address] [place], [place] [address] Phone: [phone]  [facility] Overflow  Shelter  [address] [place], [place] [address] (Check in at 6:00PM for placement at a local shelter) Phone: [phone]  [facility] MINISTRY Address: [address] [place], [place] [address] Phone Number: [phone] Hours of Operation: Residents of [place] can come to obtain food Monday through Friday from 8:30am until 3:30pm. Photo ID and Social Security cards required for all residents of a household. Can come six times a year  THE BLESSED TABLE Address: [address] [place], [place] [address] Phone Number: [phone] Hours of Operation: Operates Tuesday-Friday 10:00 a.m. to 1 p.m. Requirements: Referral from DSS needed. May come 6 times a year, 30 days apart. Photo ID and SS required for all residents of household.  [facility] MINISTRIES Address: [address] [place], [place] [address] Phone Number: [phone] Hours of Operation: Food pantry is open on the last Saturday of each month from 10:00 am - 12:00 noon. No appointment needed. No qualifications.  [facility] Address: [address] [place], [place] [address] Phone Number: [phone]. [phone] Hours of Operation: Must make reservations to pick up food on Saturdays. Sign ups for Saturday pick up beginning at 8:30 a.m. on Monday morning.  [facility] Address: [address] [place], [place] [address] Phone Number: [phone] Hours of Operation: If you need food, bring proper identification such as a driver's license to receive a bag of food once a month. Requirements: Can come once every 30 days with referral DSS, Holiday representative, Mental health etc. Each referral good for six visits. Photo ID required. *1st visit no referral required.  [facility] Address: [address] [place], [place] [address] Phone Number: [phone]  [place] [facility] Address: [address] [place], [place] [address] Phone  Number: [phone] Hours of Operation:  You can register at [email or web address] for free groceries  [facility]OD PANTRY Address: [address] [place], [place] [address] Phone Number: [phone] Hours of Operation: Drive through giveaway, first come first served. Every 3rd Saturday 11AM - 1PM  [facility] [address] Address: [address], [place] [address] Phone Number: [phone]   [place]  HAND TO HAND FOOD PANTRY Address: [address] [facility] [address] [place], [place] [address] Phone Number: [phone] Hours of Operation: Once a month every 3rd Saturday  [facility] Address: [address] [place], [place] [address] Phone Number: [phone] Hours of Operation: Distribution happens from 9:00-10:00 a.m. every Saturday.     [facility] Address: [address] [place], [place] [address] Phone Number: [phone] Hours of Operation: ONCE a week for the community food distribution held every Tuesday, Wednesday and Thursday from 11 a.m. - 2:00 p.m. Food is  available on a first come, first serve basis and varies week to week. No appointment necessary for drive thru pick up.  [facility] Address: [address] [place], [place] [address] Phone Number: [phone] Hours of Operation: Open every 3rd Thursday 9:30 a.m. - 11:00 a.m.  [facility] Address: [address] [place], [place] [address] Phone Number: [phone] Hours of Operation: Please call for hours, directions, and questions  [facility] [place] [facility] Address: [address], [place], [place]  [address] Phone Number: [phone] Website: https://www.Hollyguns.co.za Food Finder app: https://findfood.ghpfa.org  [employer] Address: [address] [place], [place] [address] Phone Number: [phone] Hours of Operation: Contact Bree Harpe. Enrolled Substance Abuse Clients Only  [facility] Address: [address] [place], [address]  Phone Number: [phone] Hours of Operation: Contact Bartley Irving. Food pantry open the 3rd Saturday of each month from 9 a.m. -12 p.m. only  [place] [facility] Address: [address] [place], [place] [address] Phone Number: [phone] Hours of Operation: Contact Geni Lee. Emergency food bank open on Saturdays by appointment only  [facility] Address: [address] [place], [place] [address] Phone Number: [phone] Hours of Operation: No specific contact person; Anyone can help  [facility] Address: [address] [place], [place] [address] Phone Number: [phone] Hours of Operation: Contact Medford Molt. Agency gives out a bag of food every Thursday from 2-4 p.m. only, and also provides a community meal every Thursday between 5-6 p.m. Other services provided include rent/mortgage and utility assistance, women's winter shelter, thrift store, and senior adult activities.  [facility] [place] Address: [address] [place], [place] [address] Phone Number: [phone] Hours of Operation: The Emergency Food Assistance Program provides individuals and families with a generous supply of food including meat, fresh vegetables, and nonperishable items. The food box contains five days' worth of food, and each family or individual can receive a box once per month. M, W, Th, Fr 11am-2pm, walk-ins welcome.  [facility] Address: [address] [place], [place] [address]  Phone Number: [phone] Hours of Operation: Contact Asia Cathlean. Tuesdays and Thursdays from 11am - 3pm by appointment only  Sunday, by APPOINTMENT ONLY  [address] of [place], [address], [address], [phone], 3.2 mi from [facility], call in advance for appointment at 10:00am or at 4:00pm, must provide valid photo ID  Monday  9:30am-5:00pm [facility], [address]  [employer] [phone], [phone], 0.9 mi from [facility], can come four times per year, bring your photo ID and SS cards for other residents of household, will make appointments for those who work and need to come after 5pm  10:00am-12:00noon [employer], [address]  [place], [address], [phone], 1.7 mi from [facility], can come once every 60 days per household, need referral from DSS, [employer], etc., bring photo ID and SS card   10:00am-1:00pm De[facility]ra [facility],  [address] [place], [address], [phone], 7.7 mi from [facility], can come once every thirty days with a referral from DSS, [employer], Mental Health, etc. -- each referral good for six visits, bring photo ID   10:00am-1:00pm [facility], [address] [address], [phone], 4.2 mi from [facility], can come  once every 6 months, open to [facility] residents, bring photo ID and copy of a current utility bill in your name, please call first to verify that food is available  6:30pm-8:30pm [facility], [address] [address], [phone], 3.2 mi from [facility], can come once every 30 days, maximum 6 times per year, bring your photo ID and SS numbers for other residents of household  Monday by APPOINTMENT ONLY  [facility], [address] [place], [address],  [phone], 2.5 mi from [facility], call in advance for appointment between 10:00am-2:00pm, bring your photo ID and SS cards for all residents of household, can come once every 3 months  [facility], [address], [address], [phone], 0.7 mi from [facility], call in advance for appointment, can come once every 30 days, bring your photo ID and SS cards for other residents of household   Tuesday  9:00am-12:00noon [employer], [address] [address], [phone], 1.3 mi from [facility], can come once every 3  months, bring your photo ID and SS numbers for other residents of household   9:00am-1:00pm [facility] [address],  [place], [address], [phone]/Ext [phone], 1.6 mi from [facility], can come once every two weeks  9:30am-5:00pm [employer], [address] [place] [phone], [phone], 0.9 mi from [facility], can come four times per year, bring your photo ID and SS cards for other residents of household, will make appointments for those who work and need to come after 5pm  10:00am-12:00noon [employer], [address], [address], [phone], 1.7 mi from [facility], can come once every 60 days per household, need referral from DSS, [employer], etc., bring photo ID and SS card  10:00am-1:00pm [facility], [id number],  [phone], 3.8 mi from [facility], with referral from DSS, may come six times, 30 days apart, bring your photo ID and SS cards for all residents of household   10:00am-1:00pm [address] [facility], [address] [place], [address], [phone], 7.7 mi from [facility], can come once every thirty days with a referral from DSS, [employer], Mental Health, etc.- each referral good for six visits, bring photo ID   10:00am-1:00pm [address] [address], [phone], 4.2 mi from [facility], can come once every 6 months, open to [facility] residents, bring photo ID and copy of a current utility bill in your name, please call first to verify that food is available  2:00pm-3:30pm [facility], [address], [phone], [phone], 3.7 mi from [facility], can come twelve times per year, one bag per family, bring photo ID   FIRST AND THIRD Tuesdays  10:00am-1:00pm, [facility] [address] [place], [address], [phone], 7.2 mi from [facility], can  come once every 30 days   Tuesday, WHEN FOOD IS AVAILABLE (call)  12:00noon-2:00pm [facility], [address], [address], [phone], 1.5 mi from  [facility], can come once every 30 days, bring photo ID   Tuesday, by APPOINTMENT ONLY  [facility], [address], [place],  [phone], 2.5 mi from [facility], call in advance for appointment between 1:00pm-4:00pm, bring  your photo ID and SS cards for all residents of household, can come once every 3 months   [address] of [address], [address] [address], [phone], 5 mi from [facility], call one day ahead for appointment the next day between 10:00am and 12:00noon, can come once every 3 months, bring your photo ID and must qualify according to family income   [facility], [address] [address], [phone], 0.7 mi from [facility], call in advance for appointment, can come once every 30 days, bring your photo ID and SS cards for other residents of household   [address] of [place], [address], [address],  [phone], 5.1 mi from [facility], call between 9:00am and 1:00pm M-F to make appointment. Appointments are scheduled for Tues and Thurs from 10:00am-11:30am, bring photo ID, can come once every 6 months, limit three visits over 18 months, then must have referral  Wednesday  9:30am-5:00pm [facility], [address] [place] [id number], [phone], 0.9 mi from [facility], can come four times per year, bring your photo ID and SS cards for other residents of household, will make appointments for those who work and need to come after 5pm  9:30am-11:30am [employer] [facility], [address], [address], [phone], 6.6 mi from [facility], can come once every 30 days, bring your photo ID, and SS cards for other residents of household, each monthly visit requires a written referral from GUM or  DSS with number in household on form  10:00am-12:00noon [address] [place], [address], [phone], 1.7 mi from [facility], can come once every 60 days per household, need referral from DSS, [employer], etc., bring photo ID and SS card  10:00am-1:00pm [facility], [id number],  [phone], 3.9 mi from [facility], with referral from DSS, may come six times, 30 days apart, bring your photo ID and SS cards for all residents of household   10:00am-1:00pm [address] the [facility], [address] [place], [address], [phone], 7.7 mi from [facility], can come once every thirty days with a referral from DSS, [employer], Mental Health, etc. - each referral good for six visits, bring photo ID   2:00pm-5:45pm [address], [address] [place], [address], [phone], 3.2 mi from [facility], once every 30 days, first come/first served, limited to first 25, bring photo ID   6:30pm-8:30pm [facility], [address], [address], [phone], 3.2 mi from [facility], can come once every 30 days, maximum 6 times per year, bring your photo ID and SS numbers for other residents of household  FIRST and THIRD Wednesdays   9:00am-12:00noon, [facility], [address], [address], [phone], 4.2 mi from [facility], can come once a month. Please arrive and sign in no later than 11:15 so everyone can be served by 12 noon.  THIRD Wednesday  1:30pm-3:00pm, [address] [place], [address], [phone] or [phone], 2.1 mi from [facility]  Wednesday, by APPOINTMENT ONLY  [address] [facility], [address] [address], [phone], 5 mi from [facility], call one day ahead for appointment the next day between 10:00am and 12:00noon, can come once every 3 months, bring your photo ID and must  qualify according to  family income   [facility], [address] [address], [phone], 0.7 mi from [facility], call in advance for appointment, can come once every 30 days, bring your photo ID and SS cards for other residents of household   [address] of [place], [address] [place], [address], [phone], 3.2 mi from [facility], call in advance for appointment at 7:00pm, must provide valid photo ID  Thursday  9:00am-12:00noon [employer], [address], [address], [phone], 1.3 mi from [facility], can come once every 3 months, bring your photo ID and SS numbers for other residents of household   9:00am-1:00pm [facility] [address]  [place], [address], [phone]/Ext 1, 1.6 mi from [facility], can come once every two weeks  9:30am-12:00noon [employer], [address] [address], [phone], 4.5 mi from [facility], can come once every 30 days, must state income [closed Thanksgiving and week of Christmas]  9:30am-5:00pm [employer], [address] [place] [phone], [phone], 0.9 mi from [facility], can come four times per year, bring your photo ID and SS card for other residents of household, will make appointments for those who work and need to come after 5pm  10:00am-1:00pm [employer], [address] [place], [id number],  [phone], 3.9 mi from [facility], with referral from DSS, may come six times, 30 days apart, bring your photo ID and SS cards for all residents of household   10:00am-1:00pm [address] the [facility], [address], [address], [phone], 7.7 mi from [facility], can come once every thirty days with a referral from DSS, [employer], Mental Health, etc.- each referral good for six visits, bring photo ID   10:00am-1:00pm [facility], [address] [address], [phone], 4.2 mi from [name], can come once every 6  months, open to [facility] residents, bring photo ID and copy of a current utility bill in your name, please call first to verify that food is available   SUNDAYS BREAKFAST TWO LOCATIONS: 8:00am served in [facility] by Awaken PPL Corporation 8:30am SHUTTLE provided from [facility], served at [employer], [address] LUNCH TWO LOCATIONS [plus one additional third Sunday only] 10:30am - 12:30pm served at [employer], [employer], [place] [address] (1.2 miles from [facility]) 12:30pm served in [place] by Land O'Lakes Team (THIRD Sunday only) 1:30pm served at [facility] by [facility] one location [plus one additional third Sunday only] 5:00pm Every Sunday, served under the bridge at [address] by Dovie Under the 3M Company (.7 miles from [facility]) (THIRD Sunday ONLY) 4:00pm served in the parking garage, across from Nucor Corporation, corner of [place] and [place] by Ryland Group Works Ministries MONDAYS BREAKFAST 7:30am served in [employer] by the United States [employer] Corporation and Friends LUNCH 10:30am - 12:30pm served at [employer], [employer], [place] [address] (1.2 miles from [facility]) DINNER TWO LOCATIONS: 7:00pm served in front of the courthouse at the corner of Goldman Sachs and Lowe's Companies. by [place] Monday Night Meal (3 blocks from [facility]) 4:30pm served at the [employer], [address] by Bank of [place] Company (0.6 miles from [facility]) TUESDAYS BREAKFAST 8:00am -  9:00am served at The TJX Companies, [address] (0.3 miles from [place]) LUNCH 10:30am - 12:30pm served at the [employer], [employer] [address] (1.2 miles from [place]) DINNER 6:00pm served at CSX Corporation, enter from Capital One and go to the Sonic Automotive, (0.7 miles  from [place]) [facility] BREAKFAST 7:00am - 8:00am served at [employer], [employer] [address], (1.2 miles from [place]) LUNCH ONE LOCATION [plus two additional locations listed below] 10:30am - 12:30pm served at [employer], [employer] [address] (1.2 miles from [place]) (FIRST Wednesday ONLY) 11:30am served at Dillard's, [place] [address] (6.6 miles from [place]) (SECOND Wednesday ONLY) 11:00am served at [place]. Garnette Ava [place] of [address], [address] (1.3 miles from [place]) [place] TWO LOCATIONS 6:00pm served at W. R. Berkley, [place] W[address] Visteon Corporation. (1.3 miles from [facility]) 4:00pm - 6:00pm (hot dogs and chips) served at Levi Strauss of [facility], [address] (1.7 miles from [place]) [place] BREAKFAST NOT AVAILABLE AT THIS TIME LUNCH 10:30am - 12:30pm served at [employer], [employer], [place] [address] (1.2 miles from [place]) DINNER 6:00pm served at [employer] Corporation, enter from Capital One and go to the Sonic Automotive, (0.7 miles from Nucor Corporation) [place] BREAKFAST NOT AVAILABLE AT THIS TIME LUNCH 10:30am - 12:30pm served at [employer], [employer] [address] (1.2 miles from [place]) DINNER TWO LOCATIONS, [plus one additional first Friday only] 6:00pm served under the bridge at [address] by Dovie Under CSX Corporation. (.7 miles from [facility]) 5:00pm - 7:00pm served at Levi Strauss of [facility], [address] (1.7 miles from [place]) (FIRST Friday ONLY) 5:45 pm - SHUTTLE provided from the LIBRARY at 5:45pm. Served at [facility], [address]. SATURDAYS BREAKFAST TWO LOCATIONS [plus one additional last Saturday only] 8:00am served at [facility] by Delphi 8:30am served at [employer], [address] (2.2 miles from [facility]) (LAST Saturday ONLY) 8:30am served at Beazer Homes, [address] (5 miles from [place]) LUNCH 10:30am - 12:30pm served at [employer], [employer] [address] (1.2 miles from [facility]) DINNER 6:00pm served under the bridge at [address] by World Fuel Services Corporation (0.7 miles from Nucor Corporation)  DIRECTIONS FROM [place] TO ALL MEAL LOCATIONS The Bridge at [address] (.7 miles from [address]) [address] on [place]. Turn Right onto DIRECTV 433 ft. Continue onto [address] under bridge, about 500 ft. Courthouse (3 blocks from [facility]) [place] on [address]. Turn right on [place]  1 block to PPL Corporation (.5 miles from [place]) [place] on [place]. YRC Worldwide. past Brink's Company to EMCOR. Enter from Capital One and go to the Affiliated Computer Services building W. R. Berkley [address] Visteon Corporation. (1.3 miles from [facility]) [address] on [place]. Turn Right onto [address]. [address]. church will be on the Left. The TJX Companies [address] (.3 miles from [facility]) Go .3 miles on  W. Friend[address] Destination is on your right Dillard's at ONEOK (6.6 miles from [address]) [address] on [place] toward W Friendly Turn right onto Hershey Company Continue onto Alcoa Inc. Continue onto Toll Brothers. 5. Tommi is on right Sanmina-SCI Futures trader) [address] (.6 miles from [place]) [place] on [place]. [address] Turn Left onto [address] 0.3 miles Destination is on the Left. [place] Black & Decker at American Express (5 miles from Nucor Corporation) 1. Head south on [address]. Turn right onto W Friendly Turn slightly left onto Wachovia Corporation Continue onto Quest Diagnostics Turn right at Barnes & Noble Continue to church on right [place] Sounds of [facility] [address] (1.7 miles from [place]) [address] on [place] 1.4 miles [place] becomes [place]. [address] Continue 0.6 miles and church will be on theright. [place] Guardian Life Insurance at [address] (2.5 miles from Nucor Corporation) [place] provided from [place] Mutual Life Park] [place] on [place]. Elm toward Estée Lauder right onto Costco Wholesale left onto Emerson Electric Turn left onto Micron Technology [address] (2.2 miles from [address]) [address] on [place] 1.4 miles [place] becomes [place]. Elm [address] Turn right onto [address] and church will be on the Left. Potter's House/[place] AT&T [address] (1.2 miles from [facility]) 1.Turn right onto [facility] 2.Turn left onto GEANNIE Beagle 3.Micheline MICAEL Ruth 4.Destination is on your right [place]. Garnette Ava Tommi of [address] at ToysRus (1.3 miles from [facility]) [address] on [address] Turn left onto Genuine Parts right onto S. Maryellen Cassis. Continue onto KB Home	[place]. Turn left onto Smurfit-Stone Container. Turn right onto WellPoint.

## 2024-03-06 NOTE — ED Notes (Signed)
 This phlebotomist stuck twice and was unable to obtain labs, RN JS aware

## 2024-03-06 NOTE — H&P (Signed)
 History and Physical    Patient: [name] [name] FMW:[id number] DOB: [birth year] DOA: 03/05/2024 DOS: the patient was seen and examined on 03/06/2024 PCP: Joshua Debby CROME, MD  Patient coming from: Home  Chief Complaint:  Chief Complaint  Patient presents with   Emesis       HPI:  78 y.o. F with permAfib s/p LAA ligation and AVN ablation, CAD and hx cardiac arrest in 2016, s/p CABG, isch CM/sCHF EF 25%, CKD IIIb baseline 1.2-1.4, and DM, and HTN who presented with acute vomiting.   Patient limited historian, but reports she was in usual health until the day of admission, when she had vomiting that wouldn't stop and so came to the ER.  No preceding fever, abdominal pain.  No preceding dyspnea, orthopnea, change in chronic DOE.   She provides a vague history that since her PPM was moved 1 year ago, she developed constipation relieved with Colace, and that three months ago she developed new gurgling in her bowels, without diarrhea or vomiting or pain.  She also reports a migratory chest pain since the PPM revision, which is intermittent.  In the ER, CT abdomen benign. Lipase normal. BP 190s systolic, CXR showed edema, and lactate elevated, so Cardiology consulted for decompensated HF.  Started on milrinone  and Lasix .      Review of Systems  Respiratory:  Negative for cough, shortness of breath and wheezing.   Cardiovascular:  Positive for palpitations. Negative for chest pain, orthopnea, leg swelling and PND.  Gastrointestinal:  Positive for nausea and vomiting. Negative for abdominal pain.  All other systems reviewed and are negative.    Past Medical History:  Diagnosis Date   (HFimpEF) heart failure with improved ejection fraction (HCC)    a. 07/2018 Echo: EF 30-35%; b. 11/2019 Echo: EF 55-60%, no rwma, Gr2 DD, Nl RV size/fxn. Mild BAE. Mild MR/AI.   Arthritis    Asthma    Atopic dermatitis    CAD (coronary artery disease)    a. 2016 s/p CABG x 2 (LIMA->LAD, VG->OM); b. 08/2018  MV: EF 44%, no ischemia/infact.   Cardiac arrest (HCC) 10/2014   Cardiomyopathy, ischemic    a. 07/2018 Echo: EF 30-35%; 11/2019 Echo: EF 55-60%.   Carotid arterial disease (HCC)    a. 11/2019 Carotid U/S   CHF (congestive heart failure) (HCC)    CKD (chronic kidney disease), stage III (HCC)    COPD (chronic obstructive pulmonary disease) (HCC)    Diabetes mellitus without complication (HCC)    Dysrhythmia    Esophageal dilatation 2013   GERD (gastroesophageal reflux disease)    Glaucoma    Gout    Headache    Hearing loss    History of blood transfusion    Hypertension    Hypokalemia    Idiopathic angioedema    LGI bleed 08/06/2017   a. felt to be hemorrhoidal during that admission (no drop in Hgb).   Lower back pain    Paroxysmal atrial fibrillation (HCC)    a. Dx 2016-->h/o difficult to control rates (complicated by noncompliance), not felt to be a candidate for ablation or antiarrhythmic due to noncompliance; b. Recurrent AF 2021 - converted w/ IV dilt; c. CHA2DS2VASc = 7-->Xarelto .   Personal history of noncompliance with medical treatment, presenting hazards to health    Pneumonia    Prediabetes    Presence of permanent cardiac pacemaker    Right leg numbness    S/P CABG x 2 with clipping of LA appendage 11/14/2014  LIMA to LAD, SVG to OM, EVH via right thigh   Urine incontinence    Uterine fibroid    Past Surgical History:  Procedure Laterality Date   AV NODE ABLATION N/A 01/18/2021   Procedure: AV NODE ABLATION;  Surgeon: Waddell Danelle ORN, MD;  Location: [facility] INVASIVE CV LAB;  Service: Cardiovascular;  Laterality: N/A;   BALLOON DILATION N/A 10/10/2019   Procedure: BALLOON DILATION;  Surgeon: Elicia Claw, MD;  Location: [facility] ENDOSCOPY;  Service: Gastroenterology;  Laterality: N/A;   BIOPSY  10/10/2019   Procedure: BIOPSY;  Surgeon: Elicia Claw, MD;  Location: [facility] ENDOS[facility];  Service: Gastroenterology;;   BIOPSY  10/11/2019   Procedure: BIOPSY;  Surgeon:  Elicia Claw, MD;  Location: [facility] ENDOS[facility];  Service: Gastroenterology;;   JUVENTINO LLANO N/A 11/21/2022   Procedure: BIVI  ICD UPGRADE;  Surgeon: Waddell Danelle ORN, MD;  Location: [facility] INVASIVE CV LAB;  Service: Cardiovascular;  Laterality: N/A;   CARDIOVERSION N/A 11/18/2014   Procedure: CARDIOVERSION;  Surgeon: Vinie JAYSON Maxcy, MD;  Location: [facility] OR;  Service: Cardiovascular;  Laterality: N/A;   CARDIOVERSION N/A 12/25/2017   Procedure: CARDIOVERSION;  Surgeon: Cherrie Toribio SAUNDERS, MD;  Location: [facility] ENDOSCOPY;  Service: Cardiovascular;  Laterality: N/A;   CLIPPING OF ATRIAL APPENDAGE N/A 11/14/2014   Procedure: CLIPPING OF ATRIAL APPENDAGE;  Surgeon: Sudie VEAR Laine, MD;  Location: [facility] OR;  Service: Open Heart Surgery;  Laterality: N/A;   COLONOSCOPY  2013   COLONOSCOPY WITH PROPOFOL  N/A 10/11/2019   Procedure: COLONOSCOPY WITH PROPOFOL ;  Surgeon: Elicia Claw, MD;  Location: [facility] ENDOS[facility];  Service: Gastroenterology;  Laterality: N/A;   CORONARY ARTERY BYPASS GRAFT N/A 11/14/2014   Procedure: CORONARY ARTERY BYPASS GRAFTING (CABG)TIMES 2 USING LEFT INTERNAL MAMMARY ARTERY AND RIGHT SAPHENOUS VEIN HARVESTED ENDOSCOPICALLY;  Surgeon: Sudie VEAR Laine, MD;  Location: [facility] OR;  Service: Open Heart Surgery;  Laterality: N/A;   ESOPHAGOGASTRODUODENOSCOPY (EGD) WITH PROPOFOL  N/A 10/10/2019   Procedure: ESOPHAGOGASTRODUODENOSCOPY (EGD) WITH PROPOFOL ;  Surgeon: Elicia Claw, MD;  Location: [facility] ENDOS[facility];  Service: Gastroenterology;  Laterality: N/A;   ESOPHAGOGASTRODUODENOSCOPY (EGD) WITH PROPOFOL  N/A 10/27/2019   Procedure: ESOPHAGOGASTRODUODENOSCOPY (EGD) WITH PROPOFOL ;  Surgeon: Saintclair Jasper, MD;  Location: [facility] ENDOSCOPY;  Service: Gastroenterology;  Laterality: N/A;   GIVENS CAPSULE STUDY N/A 10/27/2019   Procedure: GIVENS CAPSULE STUDY;  Surgeon: Saintclair Jasper, MD;  Location: [facility] ENDOSCOPY;  Service: Gastroenterology;  Laterality: N/A;   INSERT / REPLACE / REMOVE PACEMAKER     LEFT HEART  CATHETERIZATION WITH CORONARY ANGIOGRAM N/A 11/04/2014   Procedure: LEFT HEART CATHETERIZATION WITH CORONARY ANGIOGRAM;  Surgeon: Debby DELENA Sor, MD;  Location: [facility] CATH LAB;  Service: Cardiovascular;  Laterality: N/A;   ORIF ANKLE FRACTURE Right 06/26/2023   Procedure: OPEN REDUCTION INTERNAL FIXATION (ORIF) BIMALLEOLAR ANKLE FRACTURE;  Surgeon: Barton Drape, MD;  Location: [facility] ORS;  Service: Orthopedics;  Laterality: Right;   PACEMAKER IMPLANT N/A 01/18/2021   Procedure: PACEMAKER IMPLANT;  Surgeon: Waddell Danelle ORN, MD;  [facility]: [facility] INVAS[facility] CV LAB;  Service: Cardiovascular;  Laterality: N/A;   PACEMAKER IMPLANT Left    POLYPECTOMY  10/11/2019   Procedure: POLYPECTOMY;  Surgeon: Elicia Claw, MD;  Location: [facility] ENDOSCOPY;  Service: Gastroenterology;;   RIGHT HEART CATH N/A 06/08/2022   Procedure: RIGHT HEART CATH;  Surgeon: Cherrie Toribio SAUNDERS, MD;  Location: [facility] INVASIVE CV LAB;  Service: Cardiovascular;  Laterality: N/A;   TEE WITHOUT CARDIOVERSION N/A 11/14/2014   Procedure: TRANSESOPHAGEAL ECHOCARDIOGRAM (TEE);  Surgeon: Sudie VEAR Laine, MD;  Location: [facility] OR;  Service: Open Heart Surgery;  Laterality: N/A;   TEE WITHOUT CARDIOVERSION N/A 12/25/2017   Procedure: TRANSESOPHAGEAL ECHOCARDIOGRAM (TEE);  Surgeon: Cherrie Toribio SAUNDERS, MD;  Location: [facility] ENDOSCOPY;  Service: Cardiovascular;  Laterality: N/A;   TEMPORARY PACEMAKER INSERTION  11/04/2014   Procedure: TEMPORARY PACEMAKER INSERTION;  Surgeon: Debby DELENA Sor, MD;  Location: [facility] CATH LAB;  Service: Cardiovascular;;   TOOTH EXTRACTION     Social History:  reports that she has been smoking cigarettes. She started smoking about 58 years ago. She has a 28 pack-year smoking history. She has never used smokeless tobacco. She reports that she does not drink alcohol  and does not use drugs.  Allergies  Allergen Reactions   Bee Venom Anaphylaxis   Ivp Dye [Iodinated Contrast Media] Anaphylaxis   Ace Inhibitors Other (See Comments)     angioedema   Atenolol Other (See Comments)    severe headaches   Codeine Nausea And Vomiting   Entresto  [Sacubitril -Valsartan ] Other (See Comments)    Chest pain    Isosorbide  Other (See Comments)    Severe headaches   Shrimp [Shellfish Allergy] Swelling   Simvastatin Other (See Comments)    not sure   Jardiance [Empagliflozin] Other (See Comments)    Caused Boils    Penicillin G Rash and Dermatitis    Family History  Problem Relation Age of Onset   Cancer Mother        LYMPHOMA   Heart disease Father    Other Father        TB   CVA Sister    Prostate cancer Brother 58   Diabetes Brother     Prior to Admission medications   Medication Sig Start Date End Date Taking? Authorizing Provider  acetaminophen  (TYLENOL ) 500 MG tablet Take 500 mg by mouth as needed for mild pain (pain score 1-3) or moderate pain (pain score 4-6).   Yes [provider]  Albuterol -Budesonide  (AIRSUPRA ) 90-80 MCG/ACT AERO Inhale 2 puffs into the lungs every 4 (four) hours as needed. 01/10/24  Yes Sagardia, Emil Schanz, MD  allopurinol  (ZYLOPRIM ) 100 MG tablet Take 1 tablet (100 mg total) by mouth daily. NEEDS FOLLOW UP APPOINTMENT FOR MORE REFILLS 11/28/23  Yes Bensimhon, Daniel R, MD  Aromatic Inhalants (VICKS VAPOR INHALER IN) Inhale into the lungs as needed.   Yes [provider]  atorvastatin  (LIPITOR ) 10 MG tablet TAKE 1 TABLET BY MOUTH EVERYDAY AT BEDTIME 07/22/23  Yes Joshua Debby CROME, MD  gabapentin  (NEURONTIN ) 100 MG capsule TAKE 1 CAPSULE BY MOUTH EVERYDAY AT BEDTIME 01/15/24  Yes Gershon Donnice SAUNDERS, DPM  hydrALAZINE  (APRESOLINE ) 25 MG tablet TAKE 2 TABLETS (50 MG TOTAL) BY MOUTH 3 (THREE) TIMES DAILY. 09/22/23  Yes Bensimhon, Toribio SAUNDERS, MD  losartan  (COZAAR ) 100 MG tablet Take 1 tablet (100 mg total) by mouth daily. 12/22/23  Yes Alvia Corean CROME, FNP  pantoprazole  (PROTONIX ) 40 MG tablet TAKE 1 TABLET BY MOUTH EVERY DAY 01/23/24  Yes Joshua Debby CROME, MD  spironolactone  (ALDACTONE ) 25  MG tablet TAKE 1 TABLET (25 MG TOTAL) BY MOUTH DAILY. 12/19/23  Yes Bensimhon, Toribio SAUNDERS, MD  torsemide  (DEMADEX ) 20 MG tablet TAKE 1.5 TABLETS BY MOUTH EVERY DAY 11/28/23  Yes Waddell Danelle ORN, MD  XARELTO  15 MG TABS tablet TAKE 1 TABLET (15 MG TOTAL) BY MOUTH DAILY WITH SUPPER 01/09/24  Yes Bensimhon, Toribio SAUNDERS, MD  Alcohol  Swabs  (ALCOHOL  PADS) 70 % PADS 1 Act by Does not apply route 2 (two) times daily. 04/28/23   Joshua,  Debby CROME, MD  Blood Glucose Monitoring Suppl (ONE TOUCH ULTRA 2) w/Device KIT 1 Act by Does not apply route 2 (two) times daily. 04/28/23   Joshua Debby CROME, MD  [facility] ULTRA test strip 1 each by Other route 2 (two) times daily. as directed 04/28/23   Joshua Debby CROME, MD  TRELEGY ELLIPTA  100-62.5-25 MCG/ACT AEPB INHALE 1 PUFF INTO THE LUNGS DAILY Patient not taking: Reported on 03/06/2024 01/24/24   Joshua Debby CROME, MD  losartan  (COZAAR ) 50 MG tablet Take 1 tablet (50 mg total) by mouth daily. 12/16/22   Bensimhon, Toribio SAUNDERS, MD    Physical Exam: Vitals:   03/06/24 0330 03/06/24 0345 03/06/24 0515 03/06/24 0651  BP: (!) 163/102 (!) 166/78 (!) 168/86   Pulse: 64 60 86   Resp: 18 (!) 21 (!) 24   Temp:    98.2 F (36.8 C)  TempSrc:    Oral  SpO2: 90% 96% 94%   Weight:       Elderly adult female, lying in bed, interactive and appropriate Oropharynx tacky dry, dentures in place, some small scattered white plaques, lips normal Heart rate slow, regular, no murmurs, no peripheral edema, may be slight nonpitting edema in the right leg Respiratory rate normal, lung sounds diminished, no rales or wheezes appreciated Abdomen soft, no tenderness palpation Bowel sounds active, no distention or guarding Attention normal, affect appropriate, oriented to person, place, and time, face symmetric, strength 4/5 and symmetric in all 4 extremities, speech fluent     Data Reviewed: Basic metabolic panel shows creatinine slightly above baseline but within baseline range LFTs normal Lipase  normal CBC unremarkable BNP 557, slightly above previous, troponin low and flat Lactic acid slightly elevated CT abdomen and pelvis unremarkable Chest x-ray, personally reviewed, shows mild interstitial edema EKG, personally reviewed, shows paced rhythm    Assessment and Plan: Nausea vomiting Seems to be resolved.  No Diarrhea to suggest enteritis.  She is hungry.  CT abdomen unremarkable.  Working diagnosis is that this is a CHF symptom - ADAT - Antiemetics    Acute on chronic combined systolic and diastolic CHF (congestive heart failure) (HCC) - Inotropes, RHC per Cardiology - Needs GDMT, defer specifics to Cardiology - Continue home spironolactone , losartan , Lipitor , hydralazine      Coronary artery disease due to lipid rich plaque No chest pain, troponin low and flat, not consistent with ACS - Continue Lipitor , losartan , Xarelto   CKD (chronic kidney disease), stage IIIb (HCC) Baseline creatinine appears to be in the range 1.2-1.4  Persistent atrial fibrillation (HCC) -Continue Xarelto     Controlled type 2 diabetes mellitus without complication, without long-term current use of insulin  (HCC) Not on antiglycemic's at baseline - Sliding scale correction insulin  ordered  Essential hypertension See above  COPD (chronic obstructive pulmonary disease) (HCC) No active flare - Continue ICS/LAMA/LAMA          Advance Care Planning: Full code  Consults: Cardiology  Family Communication: None present  Severity of Illness: The appropriate patient status for this patient is INPATIENT. Inpatient status is judged to be reasonable and necessary in order to provide the required intensity of service to ensure the patient's safety. [name] presenting symptoms, physical exam findings, and initial radiographic and laboratory data in the context of their chronic comorbidities is felt to place them at high risk for further clinical deterioration. Furthermore, it is not  anticipated that the patient will be medically stable for discharge from the hospital within 2 midnights of admission.   *  I certify that at the point of admission it is my clinical judgment that the patient will require inpatient hospital care spanning beyond 2 midnights from the point of admission due to high intensity of service, high risk for further deterioration and high frequency of surveillance required.*  Author: Lonni SHAUNNA Dalton, MD 03/06/2024 8:13 AM  For on call review [email or web address].

## 2024-03-06 NOTE — ED Notes (Addendum)
 Pt assisted on bedpan, peri care completed. Pt repositoned in bed, warm blankets given.

## 2024-03-06 NOTE — Care Management CC44 (Signed)
 Condition Code 44 Documentation Completed  Patient Details  Name: [name] [name] MRN: [id number] Date of Birth: [birth year]   Condition Code 44 given:  Yes Patient signature on Condition Code 44 notice:  Yes Documentation of 2 MD's agreement:  Yes Code 44 added to claim:  Yes    [name], RN 03/06/2024, 2:55 PM

## 2024-03-06 NOTE — ED Notes (Addendum)
 Notified by phlebotomy that phlebotomists unsuccessful in obtaining labs. Awaiting second phlebotomist for attempt

## 2024-03-06 NOTE — Progress Notes (Signed)
 Heart Failure Navigator Progress Note  Assessed for Heart & Vascular TOC clinic readiness.  Patient does not meet criteria due to Advanced Heart Failure Team patient of Dr. Gala Romney. .   Navigator will sign off at this time.   Rhae Hammock, BSN, Scientist, clinical (histocompatibility and immunogenetics) Only

## 2024-03-06 NOTE — Progress Notes (Signed)
 Date and time results received: 03/06/24 1333 (use smartphrase .now to insert current time)  Test: Lactic Acid  Critical Value: 2.3  Name of Provider Notified: C. Danford, MD  Orders Received? Or Actions Taken?: No new orders

## 2024-03-06 NOTE — ED Notes (Signed)
 Unit informed of pt arrival.

## 2024-03-06 NOTE — TOC CM/SW Note (Signed)
 Transition of Care [facility]) - Inpatient Brief Assessment   Patient Details  Name: [name] [name] MRN: [id number] Date of Birth: 04-Feb-1946  Transition of Care [facility] CM/SW Contact:    Lauraine FORBES Saa, LCSW Phone Number: 03/06/2024, 2:41 PM   Clinical Narrative:  2:41 PM Per chart review, patient resides at home with friends. Patient has a PCP and insurance. Patient has SNF history with [facility]. Patient has HH history with [facility], [facility], and [facility]. Patient has DME (BSC, manual wheelchair, scale, cane, rolling walker) history with [facility], [facility], and [facility]. Patient's preferred pharmacy's are [facility] and [facility] [phone] [facility]. CSW provided SDOH (food, housing, utilities, transportation) resources. TOC Consult was placed for [facility] screening and SNF placement. TOC will continue to follow and be available to assist.  Transition of Care Asessment: Insurance and Status: Insurance coverage has been reviewed Patient has primary care physician: Yes Home environment has been reviewed: Private Residence Prior level of function:: N/A Prior/Current Home Services: No current home services (Has HH/DME history) Social Drivers of Health Review: SDOH reviewed interventions complete Readmission risk has been reviewed: Yes Transition of care needs: transition of care needs identified, TOC will continue to follow

## 2024-03-06 NOTE — Hospital Course (Addendum)
 78 y.o. F with permAfib s/p LAA ligation and AVN ablation, CAD and hx cardiac arrest in 2016, s/p CABG, isch CM/sCHF EF 25%, CKD IIIb baseline 1.2-1.4, and DM, and HTN who presented with acute vomiting.   Patient limited historian, but reports she was in usual health until the day of admission, when she had vomiting that wouldn't stop and so came to the ER.  No preceding fever, abdominal pain.  No preceding dyspnea, orthopnea, change in chronic DOE.   She provides a vague history that since her PPM was moved 1 year ago, she developed constipation relieved with Colace, and that three months ago she developed new gurgling in her bowels, without diarrhea or vomiting or pain.  She also reports a migratory chest pain since the PPM revision, which is intermittent.  In the ER, CT abdomen benign. Lipase normal. BP 190s systolic, CXR showed edema, and lactate elevated, so Cardiology consulted for decompensated HF.  Started on milrinone  and Lasix .

## 2024-03-06 NOTE — ED Notes (Signed)
 Unable to obtain am labs. Phlebotomy notified.

## 2024-03-06 NOTE — Discharge Summary (Signed)
 Physician Discharge Summary   Patient: [name] [name] MRN: [id number] DOB: 1945/10/05  Admit date:     03/05/2024  Discharge date: 03/06/24  Discharge Physician: Lonni SHAUNNA Dalton   PCP: Joshua Debby CROME, MD     Recommendations at discharge:  Follow up with Cardiology Advanced HF in 10 days Return precautions given     Discharge Diagnoses: Principal Problem:   Nausea Active Problems:   Acute on chronic combined systolic and diastolic CHF (congestive heart failure) (HCC)   Coronary artery disease due to lipid rich plaque   CKD (chronic kidney disease), stage III (HCC)   Persistent atrial fibrillation (HCC)   Controlled type 2 diabetes mellitus without complication, without long-term current use of insulin  (HCC)   Essential hypertension   COPD (chronic obstructive pulmonary disease) (HCC)   hypomagnesemia      Hospital Course: 78 y.o. F with permAfib s/p LAA ligation and AVN ablation, CAD and hx cardiac arrest in 2016, s/p CABG, isch CM/sCHF EF 25%, CKD IIIb baseline 1.2-1.4, and DM, and HTN who presented with vomiting.   The patient was admitted and treated with IV Lasix . Evaluated overnight by the Cardiology fellow, and milrinone  was started.  In the morning, patient seen by Advanced HF team.  They felt she was not in acute exacerbation of HF.  Stopped milrinone , canceled plans for right heart cath.  Patient was hungry, nuasea was imprved, and she was able to tolerate PO intake, reported she had some residual nausea, but otherwise felt at her baseline and desired to discharge home.  Discussed with Cardiology team.  Her lactic acid is nonspecific.   She has no signs, symptoms, lab findings to suggest infection or sepsis.  I favor that this is related to her (barely) compensated HF.  Scheduled for close follow up in HF clinic.  Resumed home medications.  Magnesium  supplemented.             The [place]  Controlled Substances Registry was reviewed for this  patient prior to discharge.  Consultants: Cardiology Procedures performed:   Disposition: Home Diet recommendation:  Cardiac diet  DISCHARGE MEDICATION: Allergies as of 03/06/2024       Reactions   Bee Venom Anaphylaxis   Ivp Dye [iodinated Contrast Media] Anaphylaxis   Ace Inhibitors Other (See Comments)   angioedema   Atenolol Other (See Comments)   severe headaches   Codeine Nausea And Vomiting   Entresto  [sacubitril -valsartan ] Other (See Comments)   Chest pain    Isosorbide  Other (See Comments)   Severe headaches   Shrimp [shellfish Allergy] Swelling   Simvastatin Other (See Comments)   not sure   Jardiance [empagliflozin] Other (See Comments)   Caused Boils   Penicillin G Rash, Dermatitis        Medication List     TAKE these medications    acetaminophen  500 MG tablet Commonly known as: TYLENOL  Take 500 mg by mouth as needed for mild pain (pain score 1-3) or moderate pain (pain score 4-6).   Airsupra  90-80 MCG/ACT Aero Generic drug: Albuterol -Budesonide  Inhale 2 puffs into the lungs every 4 (four) hours as needed.   Alcohol  Pads 70 % Pads 1 Act by Does not apply route 2 (two) times daily.   allopurinol  100 MG tablet Commonly known as: ZYLOPRIM  Take 1 tablet (100 mg total) by mouth daily. NEEDS FOLLOW UP APPOINTMENT FOR MORE REFILLS   atorvastatin  10 MG tablet Commonly known as: LIPITOR  TAKE 1 TABLET BY MOUTH EVERYDAY AT BEDTIME  gabapentin  100 MG capsule Commonly known as: NEURONTIN  TAKE 1 CAPSULE BY MOUTH EVERYDAY AT BEDTIME   hydrALAZINE  25 MG tablet Commonly known as: APRESOLINE  TAKE 2 TABLETS (50 MG TOTAL) BY MOUTH 3 (THREE) TIMES DAILY.   losartan  100 MG tablet Commonly known as: COZAAR  Take 1 tablet (100 mg total) by mouth daily.   ondansetron  4 MG tablet Commonly known as: ZOFRAN  Take 1 tablet (4 mg total) by mouth every 8 (eight) hours as needed for nausea or vomiting.   ONE TOUCH ULTRA 2 w/Device Kit 1 Act by Does not apply route  2 (two) times daily.   OneTouch Ultra test strip Generic drug: glucose blood 1 each by Other route 2 (two) times daily. as directed   pantoprazole  40 MG tablet Commonly known as: PROTONIX  TAKE 1 TABLET BY MOUTH EVERY DAY   spironolactone  25 MG tablet Commonly known as: ALDACTONE  TAKE 1 TABLET (25 MG TOTAL) BY MOUTH DAILY.   torsemide  20 MG tablet Commonly known as: DEMADEX  TAKE 1.5 TABLETS BY MOUTH EVERY DAY   Trelegy Ellipta  100-62.5-25 MCG/ACT Aepb Generic drug: Fluticasone -Umeclidin-Vilant INHALE 1 PUFF INTO THE LUNGS DAILY   VICKS VAPOR INHALER IN Inhale into the lungs as needed.   Xarelto  15 MG Tabs tablet Generic drug: Rivaroxaban  TAKE 1 TABLET (15 MG TOTAL) BY MOUTH DAILY WITH SUPPER        Follow-up Information     [facility] Follow up on 03/18/2024.   Specialty: Cardiology Why: Follow up in the Advanced [facility] 8/4 at 1030 Contact information: [address] [place]  [id number] [phone]                Discharge Instructions     Discharge instructions   Complete by: As directed    **IMPORTANT DISCHARGE INSTRUCTIONS**   From Dr. Jonel: You were evaluated for nausea and vomiting.  Here, you had a CT scan of the abdomen that showed no concerning findings (the stomach, intestines, pancreas, gallbladder and liver all appeared normal)  Your labs showed no particular explanation for the vomiting, and thankfuly the vomiting resolved by itself  If you have more nausea, you may take the anti-nausea medicine called ondansetron   The heart specialists were initially concerned about whether you had heart failure, but when the Advanced Heart Failure team saw you, they felt that you did not.  Go see the heart failure team in 1 week Do NOT miss this appointment  Return for any new symptoms of chest pain, trouble breathing, fever or confusion.   Increase activity slowly    Complete by: As directed        Discharge Exam: Filed Weights   03/06/24 0145 03/06/24 1228  Weight: 74.8 kg 75.1 kg    General: Pt is alert, awake, not in acute distress, appears tired Cardiovascular: RRR, nl S1-S2, no murmurs appreciated.   No LE edema.   Respiratory: Normal respiratory rate and rhythm.  CTAB without rales or wheezes. Abdominal: Abdomen soft and non-tender.  No distension or HSM.   Neuro/Psych: Strength symmetric in upper and lower extremities.  Judgment and insight appear normal.   Condition at discharge: fair  The results of significant diagnostics from this hospitalization (including imaging, microbiology, ancillary and laboratory) are listed below for reference.   Imaging Studies: CT ABDOMEN PELVIS WO CONTRAST Result Date: 03/06/2024 EXAM: CT ABDOMEN AND PELVIS WITHOUT CONTRAST 03/06/2024 12:26:05 AM TECHNIQUE: CT of the abdomen and pelvis was performed without  the administration of intravenous contrast. Multiplanar reformatted images are provided for review. Automated exposure control, iterative reconstruction, and/or weight based adjustment of the mA/kV was utilized to reduce the radiation dose to as low as reasonably achievable. COMPARISON: 01/11/2023 CLINICAL HISTORY: Abdominal pain, acute, nonlocalized. FINDINGS: LOWER CHEST: No acute abnormality. LIVER: Mildly nodular hepatic contour, nonspecific. GALLBLADDER AND BILE DUCTS: Gallbladder is unremarkable. No biliary ductal dilatation. SPLEEN: No acute abnormality. PANCREAS: No acute abnormality. ADRENAL GLANDS: No acute abnormality. KIDNEYS, URETERS AND BLADDER: No stones in the kidneys or ureters. No hydronephrosis. No perinephric or periureteral stranding. Urinary bladder is unremarkable. GI AND BOWEL: Normal appendix (series 3, image 46). Stomach demonstrates no acute abnormality. There is no bowel obstruction. No bowel wall thickening. PERITONEUM AND RETROPERITONEUM: No ascites. No free air. VASCULATURE:  Atherosclerotic calcifications of the abdominal aorta and branch vessels. LYMPH NODES: No lymphadenopathy. REPRODUCTIVE ORGANS: Calcified uterine fibroids. BONES AND SOFT TISSUES: Mild degenerative changes of the visualized thoracolumbar spine. No acute osseous abnormality. No focal soft tissue abnormality. IMPRESSION: 1. No acute findings in the abdomen or pelvis. Electronically signed by: Pinkie Pebbles MD 03/06/2024 12:32 AM EDT RP Workstation: [id number]   DG Chest Portable 1 View Result Date: 03/05/2024 EXAM: 1 VIEW XRAY OF THE CHEST 03/05/2024 11:12:00 PM COMPARISON: 06/12/2023 CLINICAL HISTORY: Encounter for shortness of breath. FINDINGS: LUNGS AND PLEURA: Mild interstitial edema. No focal pulmonary opacity. No pleural effusion. No pneumothorax. HEART AND MEDIASTINUM: Cardiomegaly. Median sternotomy. BONES AND SOFT TISSUES: No acute osseous abnormality. Left subclavian pacemaker. IMPRESSION: 1. Cardiomegaly with mild interstitial edema. Electronically signed by: Pinkie Pebbles MD 03/05/2024 11:23 PM EDT RP Workstation: [id number]    Microbiology: Results for orders placed or performed during the hospital encounter of 03/05/24  MRSA Next Gen by PCR, Nasal     Status: None   Collection Time: 03/06/24 12:35 PM   Specimen: Nasal Mucosa; Nasal Swab  Result Value Ref Range Status   MRSA by PCR Next Gen NOT DETECTED NOT DETECTED Final    Comment: (NOTE) The GeneXpert MRSA Assay (FDA approved for NASAL specimens only), is one component of a comprehensive MRSA colonization surveillance program. It is not intended to diagnose MRSA infection nor to guide or monitor treatment for MRSA infections. Test performance is not FDA approved in patients less than 62 years old. Performed at [facility], [address] [place], [place] [address]    *Note: Due to a large number of results and/or encounters for the requested time period, some results have not been displayed. A complete set of  results can be found in Results Review.    Labs: CBC: Recent Labs  Lab 03/05/24 2232 03/05/24 2238 03/05/24 2239  WBC 8.0  --   --   NEUTROABS 4.4  --   --   HGB 17.0* 18.0* 17.7*  HCT 51.4* 53.0* 52.0*  MCV 92.3  --   --   PLT 131*  --   --    Basic Metabolic Panel: Recent Labs  Lab 03/05/24 2232 03/05/24 2238 03/05/24 2239 03/06/24 0738  NA 143 145 145 142  K 3.5 3.4* 3.4* 4.6  CL 111 112*  --  114*  CO2 18*  --   --  15*  GLUCOSE 147* 139*  --  115*  BUN 28* 30*  --  24*  CREATININE 1.41* 1.30*  --  1.31*  CALCIUM  9.7  --   --  9.4  MG  --   --   --  1.9  Liver Function Tests: Recent Labs  Lab 03/05/24 2232  AST 25  ALT 21  ALKPHOS 116  BILITOT 1.4*  PROT 7.6  ALBUMIN  3.8   CBG: Recent Labs  Lab 03/05/24 2223 03/06/24 1256  GLUCAP 151* 133*     Signed: Lonni SHAUNNA Dalton, MD [facility] 03/06/2024

## 2024-03-06 NOTE — Consult Note (Addendum)
 Advanced Heart Failure Team Consult Note   Primary Physician: Joshua Debby CROME, MD Cardiologist:  Oneil Parchment, MD  Reason for Consultation: A/C HFrEF HPI:    [name] [name] is seen today for evaluation of A/C HFrEF at the request of Dr. Jonel, hospital medicine.   [name] is a 78 y.o. female with h/o HTN, former tobacco abuse, permanent AF, CAD s/p CABG x 2 (LIMA to LAD and SVG to OM) in 2016, chronic combined HF, Ischemic CM, HLD, and Biotronik PPM. She was initially noted to be in atrial fibrillation in 2016 with a reduced LVEF, leading to ischemic workup with a cardiac cath showing severe multivessel CAD with LM dz -> CABG x2 utilizing LIMA to LAD, SVG to OM.   Admitted 1/19 for AF with RVR in setting of Influenza A. EP was consulted to discuss plans for Tikosyn vs. ablation vs. medication rate control however, due to her known medication noncompliance she was found to not be an ablation nor antiarrhythmic candidate   Admitted 6/22 for AV node ablation and PPM due to uncontrolled heart rates with A fib. Underwent Biotronix single lead PPM.    Admitted 02/10/21 with a/c systolic heart failure. Diuresed with IV lasix  and transitioned to torsemide  40 mg daily. EF 25-30% with severe MR/TR.     Admitted 10/22 for COPD exacerbation with a component of A/C CHF, ran out of torsemide  several days prior   Admitted 10/23 with a/c HF, worrisome for low output. Echo EF 20%, severe LV dysfunction, RV moderately down.  Diuresed with IV lasix  and started on milrinone . RHC: low volume and CI 1.9 on milrinone , improved with small IVF bolus. Drips weaned and GDMT added back. BiDil  increased with elevated BP. Referred back to Paramedicine to help with meds. Discharged home, weight 183 lbs.   EP attempted to upgrade her PPM to ICD on 04/08 but she was found to have occlusion of left subclavian vein and procedure was aborted. Saw Dr. Waddell 04/25 and offered lead extraction and insertion of BiV ICD with  removal of pacing lead and insertion of CS lead, left bundle area lead and ICD lead. She was not interested. She appeared volume overloaded and Torsemide  was increased.    Admitted 4/24 with a/c HF, 2/2 medical non-adherence. Diuresed with IV lasix . Echo showed EF 25%, RV moderately reduced, RVSP 55 mmHg, severe BAE, moderate MR. GDMT titrated, and she was discharged home, weight 171 lbs.   Presented to the ED last night with ongoing emesis. History limited as the patient is drowsy and falls asleep with conversation. Denies CP and states breathing feels fine. Reports compliance with medications. Admission labs reviewed: K 3.5, SCr 1.41, BNP 557, HsTrop 40>45. Lactic acid 2.8>2.4>2.4. CT A/P with no acute findings. CXR with interstitial edema. Got 1 dose of lasix  so far.   Cardiac studies reviewed:  RHC (10/23): on milrinone  0.25 RA = 1, RV = 27/1, PA = 32/13 (18), PCW = 2, Fick CO/CI = 5.3/2.7, Thermo = 3.8/1.9, PVR = 3.0 (fick) 4.2 (TD), Ao sat = 93%, PA sat = 71%, 68%. Post 175cc IVF: PCW = 5, TD CO/CI = 4.1/2.1. Low volume status. With mild to moderately depressed CO on milrinone . Mild improvement in hemodynamics with volume loading  -Echo 8/24: EF 30-35%  - Echo 4/24: EF 25%, RV moderately down, moderate MR - Echo (10/23): EF 20%, severe LV dysfunction, RV moderately down - Echo 07/30/21: EF 25-30% Moderate RV dysfunction. Severe biatrial enlargement. Severe central MR/TR.  -  Echo (11/21): EF 30-35% RV normal  - Echo (4/21): EF 55-60%  - Stress Test (1/20): Low Risk EF 44% - Echo (12/19): EF 30-35% RV mildly dilated - Echo (5/18): EF stable 50-55% - Echo (3/16): EF 35-40%.  Home Medications Prior to Admission medications   Medication Sig Start Date End Date Taking? Authorizing Provider  acetaminophen  (TYLENOL ) 500 MG tablet Take 500 mg by mouth as needed for mild pain (pain score 1-3) or moderate pain (pain score 4-6).   Yes [provider]  Albuterol -Budesonide  (AIRSUPRA ) 90-80  MCG/ACT AERO Inhale 2 puffs into the lungs every 4 (four) hours as needed. 01/10/24  Yes Sagardia, Emil Schanz, MD  allopurinol  (ZYLOPRIM ) 100 MG tablet Take 1 tablet (100 mg total) by mouth daily. NEEDS FOLLOW UP APPOINTMENT FOR MORE REFILLS 11/28/23  Yes Bensimhon, Daniel R, MD  Aromatic Inhalants (VICKS VAPOR INHALER IN) Inhale into the lungs as needed.   Yes [provider]  atorvastatin  (LIPITOR ) 10 MG tablet TAKE 1 TABLET BY MOUTH EVERYDAY AT BEDTIME 07/22/23  Yes Joshua Debby CROME, MD  gabapentin  (NEURONTIN ) 100 MG capsule TAKE 1 CAPSULE BY MOUTH EVERYDAY AT BEDTIME 01/15/24  Yes Gershon Donnice SAUNDERS, DPM  hydrALAZINE  (APRESOLINE ) 25 MG tablet TAKE 2 TABLETS (50 MG TOTAL) BY MOUTH 3 (THREE) TIMES DAILY. 09/22/23  Yes Bensimhon, Toribio SAUNDERS, MD  losartan  (COZAAR ) 100 MG tablet Take 1 tablet (100 mg total) by mouth daily. 12/22/23  Yes Alvia Corean CROME, FNP  pantoprazole  (PROTONIX ) 40 MG tablet TAKE 1 TABLET BY MOUTH EVERY DAY 01/23/24  Yes Joshua Debby CROME, MD  spironolactone  (ALDACTONE ) 25 MG tablet TAKE 1 TABLET (25 MG TOTAL) BY MOUTH DAILY. 12/19/23  Yes Bensimhon, Toribio SAUNDERS, MD  torsemide  (DEMADEX ) 20 MG tablet TAKE 1.5 TABLETS BY MOUTH EVERY DAY 11/28/23  Yes Waddell Danelle ORN, MD  XARELTO  15 MG TABS tablet TAKE 1 TABLET (15 MG TOTAL) BY MOUTH DAILY WITH SUPPER 01/09/24  Yes Bensimhon, Toribio SAUNDERS, MD  Alcohol  Swabs  (ALCOHOL  PADS) 70 % PADS 1 Act by Does not apply route 2 (two) times daily. 04/28/23   Joshua Debby CROME, MD  Blood Glucose Monitoring Suppl (ONE TOUCH ULTRA 2) w/Device KIT 1 Act by Does not apply route 2 (two) times daily. 04/28/23   Joshua Debby CROME, MD  [facility] ULTRA test strip 1 each by Other route 2 (two) times daily. as directed 04/28/23   Joshua Debby CROME, MD  TRELEGY ELLIPTA  100-62.5-25 MCG/ACT AEPB INHALE 1 PUFF INTO THE LUNGS DAILY Patient not taking: Reported on 03/06/2024 01/24/24   Joshua Debby CROME, MD  losartan  (COZAAR ) 50 MG tablet Take 1 tablet (50 mg total) by mouth daily. 12/16/22    Bensimhon, Toribio SAUNDERS, MD    Past Medical History: Past Medical History:  Diagnosis Date   (HFimpEF) heart failure with improved ejection fraction (HCC)    a. 07/2018 Echo: EF 30-35%; b. 11/2019 Echo: EF 55-60%, no rwma, Gr2 DD, Nl RV size/fxn. Mild BAE. Mild MR/AI.   Arthritis    Asthma    Atopic dermatitis    CAD (coronary artery disease)    a. 2016 s/p CABG x 2 (LIMA->LAD, VG->OM); b. 08/2018 MV: EF 44%, no ischemia/infact.   Cardiac arrest (HCC) 10/2014   Cardiomyopathy, ischemic    a. 07/2018 Echo: EF 30-35%; 11/2019 Echo: EF 55-60%.   Carotid arterial disease (HCC)    a. 11/2019 Carotid U/S   CHF (congestive heart failure) (HCC)    CKD (chronic kidney disease), stage III (HCC)  COPD (chronic obstructive pulmonary disease) (HCC)    Diabetes mellitus without complication (HCC)    Dysrhythmia    Esophageal dilatation 2013   GERD (gastroesophageal reflux disease)    Glaucoma    Gout    Headache    Hearing loss    History of blood transfusion    Hypertension    Hypokalemia    Idiopathic angioedema    LGI bleed 08/06/2017   a. felt to be hemorrhoidal during that admission (no drop in Hgb).   Lower back pain    Paroxysmal atrial fibrillation (HCC)    a. Dx 2016-->h/o difficult to control rates (complicated by noncompliance), not felt to be a candidate for ablation or antiarrhythmic due to noncompliance; b. Recurrent AF 2021 - converted w/ IV dilt; c. CHA2DS2VASc = 7-->Xarelto .   Personal history of noncompliance with medical treatment, presenting hazards to health    Pneumonia    Prediabetes    Presence of permanent cardiac pacemaker    Right leg numbness    S/P CABG x 2 with clipping of LA appendage 11/14/2014   LIMA to LAD, SVG to OM, EVH via right thigh   Urine incontinence    Uterine fibroid     Past Surgical History: Past Surgical History:  Procedure Laterality Date   AV NODE ABLATION N/A 01/18/2021   Procedure: AV NODE ABLATION;  Surgeon: Waddell Danelle ORN, MD;   Location: [facility] INVASIVE CV LAB;  Service: Cardiovascular;  Laterality: N/A;   BALLOON DILATION N/A 10/10/2019   Procedure: BALLOON DILATION;  Surgeon: Elicia Claw, MD;  Location: [facility] ENDOSCOPY;  Service: Gastroenterology;  Laterality: N/A;   BIOPSY  10/10/2019   Procedure: BIOPSY;  Surgeon: Elicia Claw, MD;  Location: [facility] ENDOSCOPY;  Service: Gastroenterology;;   BIOPSY  10/11/2019   Procedure: BIOPSY;  Surgeon: Elicia Claw, MD;  Location: [facility] ENDOSCOPY;  Service: Gastroenterology;;   JUVENTINO LLANO N/A 11/21/2022   Procedure: BIVI  ICD UPGRADE;  Surgeon: Waddell Danelle ORN, MD;  Location: [facility] INVASIVE CV LAB;  Service: Cardiovascular;  Laterality: N/A;   CARDIOVERSION N/A 11/18/2014   Procedure: CARDIOVERSION;  Surgeon: Vinie JAYSON Maxcy, MD;  Location: [facility] OR;  Service: Cardiovascular;  Laterality: N/A;   CARDIOVERSION N/A 12/25/2017   Procedure: CARDIOVERSION;  Surgeon: Cherrie Toribio SAUNDERS, MD;  Location: [facility] ENDOSCOPY;  Service: Cardiovascular;  Laterality: N/A;   CLIPPING OF ATRIAL APPENDAGE N/A 11/14/2014   Procedure: CLIPPING OF ATRIAL APPENDAGE;  Surgeon: Sudie VEAR Laine, MD;  Location: [facility] OR;  Service: Open Heart Surgery;  Laterality: N/A;   COLONOSCOPY  2013   COLONOSCOPY WITH PROPOFOL  N/A 10/11/2019   Procedure: COLONOSCOPY WITH PROPOFOL ;  Surgeon: Elicia Claw, MD;  Location: [facility] ENDOSCOPY;  Service: Gastroenterology;  Laterality: N/A;   CORONARY ARTERY BYPASS GRAFT N/A 11/14/2014   Procedure: CORONARY ARTERY BYPASS GRAFTING (CABG)TIMES 2 USING LEFT INTERNAL MAMMARY ARTERY AND RIGHT SAPHENOUS VEIN HARVESTED ENDOSCOPICALLY;  Surgeon: Sudie VEAR Laine, MD;  Location: [facility] OR;  Service: Open Heart Surgery;  Laterality: N/A;   ESOPHAGOGASTRODUODENOSCOPY (EGD) WITH PROPOFOL  N/A 10/10/2019   Procedure: ESOPHAGOGASTRODUODENOSCOPY (EGD) WITH PROPOFOL ;  Surgeon: Elicia Claw, MD;  Location: [facility] ENDOSCOPY;  Service: Gastroenterology;  Laterality: N/A;   ESOPHAGOGASTRODUODENOSCOPY (EGD)  WITH PROPOFOL  N/A 10/27/2019   Procedure: ESOPHAGOGASTRODUODENOSCOPY (EGD) WITH PROPOFOL ;  Surgeon: Saintclair Jasper, MD;  Location: [facility] ENDOSCOPY;  Service: Gastroenterology;  Laterality: N/A;   GIVENS CAPSULE STUDY N/A 10/27/2019   Procedure: GIVENS CAPSULE STUDY;  Surgeon: Saintclair Jasper, MD;  Location: [facility] ENDOSCOPY;  Service: Gastroenterology;  Laterality: N/A;   INSERT / REPLACE / REMOVE PACEMAKER     LEFT HEART CATHETERIZATION WITH CORONARY ANGIOGRAM N/A 11/04/2014   Procedure: LEFT HEART CATHETERIZATION WITH CORONARY ANGIOGRAM;  Surgeon: Debby DELENA Sor, MD;  Location: [facility] CATH LAB;  Service: Cardiovascular;  Laterality: N/A;   ORIF ANKLE FRACTURE Right 06/26/2023   Procedure: OPEN REDUCTION INTERNAL FIXATION (ORIF) BIMALLEOLAR ANKLE FRACTURE;  Surgeon: Barton Drape, MD;  Location: [facility] ORS;  Service: Orthopedics;  Laterality: Right;   PACEMAKER IMPLANT N/A 01/18/2021   Procedure: PACEMAKER IMPLANT;  Surgeon: Waddell Danelle ORN, MD;  Location: [facility] INVASIVE CV LAB;  Service: Cardiovascular;  Laterality: N/A;   PACEMAKER IMPLANT Left    POLYPECTOMY  10/11/2019   Procedure: POLYPECTOMY;  Surgeon: Elicia Claw, MD;  Location: [facility] ENDOSCOPY;  Service: Gastroenterology;;   RIGHT HEART CATH N/A 06/08/2022   Procedure: RIGHT HEART CATH;  Surgeon: Cherrie Toribio SAUNDERS, MD;  Location[facility] INVASIVE CV LAB;  Service: Cardiovascular;  Laterality: N/A;   TEE WITHOUT CARDIOVERSION N/A 11/14/2014   Procedure: TRANSESOPHAGEAL ECHOCARDIOGRAM (TEE);  Surgeon: Sudie VEAR Laine, MD;  [facility] OR;  Service: Open Heart Surgery;  Laterality: N/A;   TEE WITHOUT CARDIOVERSION N/A 12/25/2017   Procedure: TRANSESOPHAGEAL ECHOCARDIOGRAM (TEE);  Surgeon: Cherrie Toribio SAUNDERS, MD;  Location: [facility] ENDOSCOPY;  Service: Cardiovascular;  Laterality: N/A;   TEMPORARY PACEMAKER INSERTION  11/04/2014   Procedure: TEMPORARY PACEMAKER INSERTION;  Surgeon: Debby DELENA Sor, MD;  Location: [facility] CATH LAB;  Service: Cardiovascular;;   TOOTH  EXTRACTION      Family History: Family History  Problem Relation Age of Onset   Cancer Mother        LYMPHOMA   Heart disease Father    Other Father        TB   CVA Sister    Prostate cancer Brother 52   Diabetes Brother     Social History: Social History   Socioeconomic History   Marital status: Widowed    Spouse name: Not on file   Number of children: 1   Years of education: 33   Highest education level: Not on file  Occupational History   Occupation: RETIRED [employer]  Tobacco Use   Smoking status: Some Days    Current packs/day: 0.00    Average packs/day: 0.5 packs/day for 56.0 years (28.0 ttl pk-yrs)    Types: Cigarettes    Start date: 11/13/1965    Last attempt to quit: 11/13/2021    Years since quitting: 2.3   Smokeless tobacco: Never   Tobacco comments:    01/02/2024 Patient smokes about 7-8 cigarettes daily    smoking 2 cigarettes/day as of 10/23/20  Vaping Use   Vaping status: Never Used  Substance and Sexual Activity   Alcohol  use: No    Alcohol /week: 0.0 standard drinks of alcohol    Drug use: No   Sexual activity: Not Currently  Other Topics Concern   Not on file  Social History Narrative   Patient reports it being difficult to pay for everything due to outstanding medical bills from hospital stay last year but states she is able to keep up with basic expenses.  Patient does have some concerns with her house's condition due to a water leak- had her roof repaired last month but has some leaking in the front which has affected her porch.  Patient owns her own car and is able to drive herself but has some concerns about the reliability of her car- has gotten taxi to  the clinic for appointment in the past when her car broke down.   Social Drivers of Corporate investment banker Strain: Low Risk  (07/12/2023)   Overall Financial Resource Strain (CARDIA)    Difficulty of Paying Living Expenses: Not hard at all  Food Insecurity: No Food Insecurity  (07/03/2023)   Hunger Vital Sign    Worried About Running Out of Food in the Last Year: Never true    Ran Out of Food in the Last Year: Never true  Transportation Needs: No Transportation Needs (07/03/2023)   PRAPARE - Administrator, Civil Service (Medical): No    Lack of Transportation (Non-Medical): No  Physical Activity: Inactive (07/12/2023)   Exercise Vital Sign    Days of Exercise per Week: 0 days    Minutes of Exercise per Session: 0 min  Stress: Stress Concern Present (07/12/2023)   [employer] of Occupational Health - Occupational Stress Questionnaire    Feeling of Stress : Very much  Social Connections: Not on file    Allergies:  Allergies  Allergen Reactions   Bee Venom Anaphylaxis   Ivp Dye [Iodinated Contrast Media] Anaphylaxis   Ace Inhibitors Other (See Comments)    angioedema   Atenolol Other (See Comments)    severe headaches   Codeine Nausea And Vomiting   Entresto  [Sacubitril -Valsartan ] Other (See Comments)    Chest pain    Isosorbide  Other (See Comments)    Severe headaches   Shrimp [Shellfish Allergy] Swelling   Simvastatin Other (See Comments)    not sure   Jardiance [Empagliflozin] Other (See Comments)    Caused Boils    Penicillin G Rash and Dermatitis    Objective:    Vital Signs:   Temp:  [97.9 F (36.6 C)-98.2 F (36.8 C)] 98.2 F (36.8 C) (07/23 0651) Pulse Rate:  [34-86] 86 (07/23 0515) Resp:  [16-34] 24 (07/23 0515) BP: (148-190)/(74-115) 168/86 (07/23 0515) SpO2:  [89 %-96 %] 94 % (07/23 0515) Weight:  [74.8 kg] 74.8 kg (07/23 0145)    Weight change: Filed Weights   03/06/24 0145  Weight: 74.8 kg    Intake/Output:   Intake/Output Summary (Last 24 hours) at 03/06/2024 0739 Last data filed at 03/06/2024 0651 Gross per 24 hour  Intake --  Output 875 ml  Net -875 ml      Physical Exam    General:  elderly, frail appearing.   Neck: JVD ~8 cm.  Cor: Regular rate & irregular rhythm. No murmurs. Lungs:  diminished Extremities: no edema noted on BLE Neuro: drowsy but alert and able to follow commands and answer questions.   Telemetry   A fib 60s-70s. Intt paced. (Personally reviewed)    EKG    A fib 70s, intt paced  Labs  Basic Metabolic Panel: Recent Labs  Lab 03/05/24 2232 03/05/24 2238 03/05/24 2239  NA 143 145 145  K 3.5 3.4* 3.4*  CL 111 112*  --   CO2 18*  --   --   GLUCOSE 147* 139*  --   BUN 28* 30*  --   CREATININE 1.41* 1.30*  --   CALCIUM  9.7  --   --    Liver Function Tests: Recent Labs  Lab 03/05/24 2232  AST 25  ALT 21  ALKPHOS 116  BILITOT 1.4*  PROT 7.6  ALBUMIN  3.8   Recent Labs  Lab 03/05/24 2232  LIPASE 26   No results for input(s): AMMONIA in the last 168 hours.  CBC:  Recent Labs  Lab 03/05/24 2232 03/05/24 2238 03/05/24 2239  WBC 8.0  --   --   NEUTROABS 4.4  --   --   HGB 17.0* 18.0* 17.7*  HCT 51.4* 53.0* 52.0*  MCV 92.3  --   --   PLT 131*  --   --    Cardiac Enzymes: No results for input(s): CKTOTAL, CKMB, CKMBINDEX, TROPONINI in the last 168 hours.  BNP: BNP (last 3 results) Recent Labs    06/12/23 1549 10/11/23 1436 03/05/24 2232  BNP 295.2* 276.2* 557.3*   ProBNP (last 3 results) No results for input(s): PROBNP in the last 8760 hours.  CBG: Recent Labs  Lab 03/05/24 2223  GLUCAP 151*   Coagulation Studies: No results for input(s): LABPROT, INR in the last 72 hours.   Imaging   CT ABDOMEN PELVIS WO CONTRAST Result Date: 03/06/2024 EXAM: CT ABDOMEN AND PELVIS WITHOUT CONTRAST 03/06/2024 12:26:05 AM TECHNIQUE: CT of the abdomen and pelvis was performed without the administration of intravenous contrast. Multiplanar reformatted images are provided for review. Automated exposure control, iterative reconstruction, and/or weight based adjustment of the mA/kV was utilized to reduce the radiation dose to as low as reasonably achievable. COMPARISON: 01/11/2023 CLINICAL HISTORY: Abdominal pain,  acute, nonlocalized. FINDINGS: LOWER CHEST: No acute abnormality. LIVER: Mildly nodular hepatic contour, nonspecific. GALLBLADDER AND BILE DUCTS: Gallbladder is unremarkable. No biliary ductal dilatation. SPLEEN: No acute abnormality. PANCREAS: No acute abnormality. ADRENAL GLANDS: No acute abnormality. KIDNEYS, URETERS AND BLADDER: No stones in the kidneys or ureters. No hydronephrosis. No perinephric or periureteral stranding. Urinary bladder is unremarkable. GI AND BOWEL: Normal appendix (series 3, image 46). Stomach demonstrates no acute abnormality. There is no bowel obstruction. No bowel wall thickening. PERITONEUM AND RETROPERITONEUM: No ascites. No free air. VASCULATURE: Atherosclerotic calcifications of the abdominal aorta and branch vessels. LYMPH NODES: No lymphadenopathy. REPRODUCTIVE ORGANS: Calcified uterine fibroids. BONES AND SOFT TISSUES: Mild degenerative changes of the visualized thoracolumbar spine. No acute osseous abnormality. No focal soft tissue abnormality. IMPRESSION: 1. No acute findings in the abdomen or pelvis. Electronically signed by: Pinkie Pebbles MD 03/06/2024 12:32 AM EDT RP Workstation: [id number]   DG Chest Portable 1 View Result Date: 03/05/2024 EXAM: 1 VIEW XRAY OF THE CHEST 03/05/2024 11:12:00 PM COMPARISON: 06/12/2023 CLINICAL HISTORY: Encounter for shortness of breath. FINDINGS: LUNGS AND PLEURA: Mild interstitial edema. No focal pulmonary opacity. No pleural effusion. No pneumothorax. HEART AND MEDIASTINUM: Cardiomegaly. Median sternotomy. BONES AND SOFT TISSUES: No acute osseous abnormality. Left subclavian pacemaker. IMPRESSION: 1. Cardiomegaly with mild interstitial edema. Electronically signed by: Pinkie Pebbles MD 03/05/2024 11:23 PM EDT RP Workstation: [id number]     Medications:     Current Medications:   Infusions:  milrinone  0.125 mcg/kg/min (03/06/24 0312)      Patient Profile   SADEEN WIEGEL is a 78 y.o. female with h/o HTN, former  tobacco abuse, permanent AF, CAD s/p CABG x 2 (LIMA to LAD and SVG to OM) in 2016, chronic combined HF, Ischemic CM, HLD, and Biotronik PPM. AHF team to see for A/C HFrEF.   Assessment/Plan  Emesis / GI - Per primary team - workup so far benign - No further emesis so far - Don't suspect 2/2 low-output HF  A/C HFrEF - Suspect Combined ICM /NICM with previous A fib RVR. RV Pacing? - Echo 2025 EF 25% severe MR, Severe TR, RV moderately reduced, pulmonary pressures in 70s.  - NYHA IIIb on admission, confounded by COPD.  - Volume appears stable. Given  80 IV lasix  this morning. Does not appear to need further diuresis.  - She was briefly placed on milrinone  this morning, now off.  - Continue spironolactone  25 mg daily. - Continue losartan   100 mg daily (failed Entresto  with chest pain).  - Increase hydralazine  50>75 mg tid, refuses Imdur  d/t headaches. - No SGLT2i due UTI/yeast infections.  - No beta blocker high burden of RV pacing on device checks (61% last check 3/24) - Would likely benefit from upgrade to CRT. Upgrade PPM to ICD aborted 8/24 d/t occlusion of left subclavian vein. Dr. Waddell saw patient and offered lead extraction and insertion of CRT, which she declined. - lactic acid 2.8>2.3>2.4. 2/2 GI losses? Do not suspect low output HF at this time. Will repeat in afternoon - limited echo ordered  MR - Severe MR on echo 6/25 - follow echo today  Permanent A fib - Biotronik dual chamber PPM/AV nodal ablation 2022.  - Rate controlled.  - Continue Xarelto  15 mg daily (adjusted for CrCl).   CAD - s/p CABG x2 2016 (LIMA to LAD and SVG to OM)  - No ASA given AC - Continue statin. - No chest pain  COPD/tobacco abuse - Continue to smoke, cessation advised  CKD stage IIIb - SCr baseline 1.2-1.4 - Stable today, follow with diuresis.    Length of Stay: 0  Beckey LITTIE Coe, NP  03/06/2024, 7:39 AM  Advanced Heart Failure Team Pager [phone] (M-F; 7a - 5p)  Please contact [facility] for night-coverage after hours (4p -7a ) and weekends on [email or web address]

## 2024-03-06 NOTE — Care Management Obs Status (Signed)
 MEDICARE OBSERVATION STATUS NOTIFICATION   Patient Details  Name: [name] [name] MRN: [id number] Date of Birth: 11-20-1945   Medicare Observation Status Notification Given:       [name], RN 03/06/2024, 2:54 PM

## 2024-03-06 NOTE — Consult Note (Signed)
 Cardiology Consult Note:   Patient ID: [name] [name] MRN: [id number]; DOB: 11/20/1945   Admission date: 03/05/2024  Primary Care Provider: Joshua Debby CROME, MD Primary Cardiologist: [name] Parchment, MD  Primary Electrophysiologist:  [name] Birmingham, MD   Chief Complaint: Nausea & vomiting  Patient Profile:   [name] [name] is a 78 y.o. female with CAD status post CABG (LIMA to LAD and SVG to OM), ischemic cardiomyopathy EF of 25%, permanent atrial fibrillation status post AVN ablation  History of Present Illness:   [name] [name] presents to the emergency department for nausea and vomiting today.  She states that she was in usual health earlier today and enjoyed her visit with her daughter who was visiting from [place] .  However around 2 PM she began becoming nauseous with intractable vomiting.  Was brought to the hospital by EMS.   Of note, she was initially diagnosed with heart failure with reduced ejection fraction back in 2019 and since then she has had a few hospitalizations for worsening heart failure, cardiogenic shock, and RV dysfunction.  In 2023 she was on milrinone  briefly for cardiogenic shock, however she has since been weaned off.  Her most recent echo demonstrated an LVEF of 25% with mildly reduced RV function  Past Medical History:  Diagnosis Date   (HFimpEF) heart failure with improved ejection fraction (HCC)    a. 07/2018 Echo: EF 30-35%; b. 11/2019 Echo: EF 55-60%, no rwma, Gr2 DD, Nl RV size/fxn. Mild BAE. Mild MR/AI.   Arthritis    Asthma    Atopic dermatitis    CAD (coronary artery disease)    a. 2016 s/p CABG x 2 (LIMA->LAD, VG->OM); b. 08/2018 MV: EF 44%, no ischemia/infact.   Cardiac arrest (HCC) 10/2014   Cardiomyopathy, ischemic    a. 07/2018 Echo: EF 30-35%; 11/2019 Echo: EF 55-60%.   Carotid arterial disease (HCC)    a. 11/2019 Carotid U/S   CHF (congestive heart failure) (HCC)    CKD (chronic kidney disease), stage III (HCC)    COPD (chronic obstructive pulmonary  disease) (HCC)    Diabetes mellitus without complication (HCC)    Dysrhythmia    Esophageal dilatation 2013   GERD (gastroesophageal reflux disease)    Glaucoma    Gout    Headache    Hearing loss    History of blood transfusion    Hypertension    Hypokalemia    Idiopathic angioedema    LGI bleed 08/06/2017   a. felt to be hemorrhoidal during that admission (no drop in Hgb).   Lower back pain    Paroxysmal atrial fibrillation (HCC)    a. Dx 2016-->h/o difficult to control rates (complicated by noncompliance), not felt to be a candidate for ablation or antiarrhythmic due to noncompliance; b. Recurrent AF 2021 - converted w/ IV dilt; c. CHA2DS2VASc = 7-->Xarelto .   Personal history of noncompliance with medical treatment, presenting hazards to health    Pneumonia    Prediabetes    Presence of permanent cardiac pacemaker    Right leg numbness    S/P CABG x 2 with clipping of LA appendage 11/14/2014   LIMA to LAD, SVG to OM, EVH via right thigh   Urine incontinence    Uterine fibroid     Past Surgical History:  Procedure Laterality Date   AV NODE ABLATION N/A 01/18/2021   Procedure: AV NODE ABLATION;  Surgeon: [name] [name] ORN, MD;  Location: [facility] INVASIVE CV LAB;  Service: Cardiovascular;  Laterality: N/A;  BALLOON DILATION N/A 10/10/2019   Procedure: BALLOON DILATION;  Surgeon: Elicia Claw, MD;  Location: [facility] ENDOSCOPY;  Service: Gastroenterology;  Laterality: N/A;   BIOPSY  10/10/2019   Procedure: BIOPSY;  Surgeon: Elicia Claw, MD;  Location: [facility] ENDOS[facility];  Service: Gastroenterology;;   BIOPSY  10/11/2019   Procedure: BIOPSY;  Surgeon: Elicia Claw, MD;  Location: [facility] ENDOS[facility];  Service: Gastroenterology;;   JUVENTINO LLANO N/A 11/21/2022   Procedure: BIVI  ICD UPGRADE;  Surgeon: Waddell [name] ORN, MD;  Location[facility] INVASIVE CV LAB;  Service: Cardiovascular;  Laterality: N/A;   CARDIOVERSION N/A 11/18/2014   Procedure: CARDIOVERSION;  Surgeon: Vinie JAYSON Maxcy, MD;   Location: [facility] OR;  Service: Cardiovascular;  Laterality: N/A;   CARDIOVERSION N/A 12/25/2017   Procedure: CARDIOVERSION;  Surgeon: Cherrie Toribio SAUNDERS, MD;  Location: [facility] ENDOSCOPY;  Service: Cardiovascular;  Laterality: N/A;   CLIPPING OF ATRIAL APPENDAGE N/A 11/14/2014   Procedure: CLIPPING OF ATRIAL APPENDAGE;  Surgeon: Sudie VEAR Laine, MD;  Location: [facility] OR;  Service: Open Heart Surgery;  Laterality: N/A;   COLONOSCOPY  2013   COLONOSCOPY WITH PROPOFOL  N/A 10/11/2019   Procedure: COLONOSCOPY WITH PROPOFOL ;  Surgeon: Elicia Claw, MD;  Location: [facility] ENDOSCOPY;  Service: Gastroenterology;  Laterality: N/A;   CORONARY ARTERY BYPASS GRAFT N/A 11/14/2014   Procedure: CORONARY ARTERY BYPASS GRAFTING (CABG)TIMES 2 USING LEFT INTERNAL MAMMARY ARTERY AND RIGHT SAPHENOUS VEIN HARVESTED ENDOSCOPICALLY;  Surgeon: Sudie VEAR Laine, MD;  Location: [facility] OR;  Service: Open Heart Surgery;  Laterality: N/A;   ESOPHAGOGASTRODUODENOSCOPY (EGD) WITH PROPOFOL  N/A 10/10/2019   Procedure: ESOPHAGOGASTRODUODENOSCOPY (EGD) WITH PROPOFOL ;  Surgeon: Elicia Claw, MD;  Location: [facility] ENDOS[facility];  Service: Gastroenterology;  Laterality: N/A;   ESOPHAGOGASTRODUODENOSCOPY (EGD) WITH PROPOFOL  N/A 10/27/2019   Procedure: ESOPHAGOGASTRODUODENOSCOPY (EGD) WITH PROPOFOL ;  Surgeon: Saintclair Jasper, MD;  Location: [facility] ENDOSCOPY;  Service: Gastroenterology;  Laterality: N/A;   GIVENS CAPSULE STUDY N/A 10/27/2019   Procedure: GIVENS CAPSULE STUDY;  Surgeon: Saintclair Jasper, MD;  Location: [facility] ENDOSCOPY;  Service: Gastroenterology;  Laterality: N/A;   INSERT / REPLACE / REMOVE PACEMAKER     LEFT HEART CATHETERIZATION WITH CORONARY ANGIOGRAM N/A 11/04/2014   Procedure: LEFT HEART CATHETERIZATION WITH CORONARY ANGIOGRAM;  Surgeon: Debby DELENA Sor, MD;  Location: [facility] CATH LAB;  Service: Cardiovascular;  Laterality: N/A;   ORIF ANKLE FRACTURE Right 06/26/2023   Procedure: OPEN REDUCTION INTERNAL FIXATION (ORIF) BIMALLEOLAR ANKLE FRACTURE;  Surgeon:  Barton Drape, MD;  Location: [facility] ORS;  Service: Orthopedics;  Laterality: Right;   PACEMAKER IMPLANT N/A 01/18/2021   Procedure: PACEMAKER IMPLANT;  Surgeon: Waddell [name] ORN, MD;  Location: [facility] INVASIVE CV LAB;  Service: Cardiovascular;  Laterality: N/A;   PACEMAKER IMPLANT Left    POLYPECTOMY  10/11/2019   Procedure: POLYPECTOMY;  Surgeon: Elicia Claw, MD;  Location: [facility] ENDOSCOPY;  Service: Gastroenterology;;   RIGHT HEART CATH N/A 06/08/2022   Procedure: RIGHT HEART CATH;  Surgeon: Cherrie Toribio SAUNDERS, MD;  Location: [facility] INVASIVE CV LAB;  Service: Cardiovascular;  Laterality: N/A;   TEE WITHOUT CARDIOVERSION N/A 11/14/2014   Procedure: TRANSESOPHAGEAL ECHOCARDIOGRAM (TEE);  Surgeon: Sudie VEAR Laine, MD;  Location: [facility] OR;  Service: Open Heart Surgery;  Laterality: N/A;   TEE WITHOUT CARDIOVERSION N/A 12/25/2017   Procedure: TRANSESOPHAGEAL ECHOCARDIOGRAM (TEE);  Surgeon: Cherrie Toribio SAUNDERS, MD;  Location: [facility] ENDOSCOPY;  Service: Cardiovascular;  Laterality: N/A;   TEMPORARY PACEMAKER INSERTION  11/04/2014   Procedure: TEMPORARY PACEMAKER INSERTION;  Surgeon: Debby DELENA Sor, MD;  Location: [facility] CATH LAB;  Service: Cardiovascular;;   TOOTH EXTRACTION       Medications Prior to Admission: Prior to Admission medications   Medication Sig Start Date End Date Taking? Authorizing Provider  acetaminophen  (TYLENOL ) 500 MG tablet Take 500 mg by mouth as needed for mild pain (pain score 1-3) or moderate pain (pain score 4-6).    [provider]  Albuterol -Budesonide  (AIRSUPRA ) 90-80 MCG/ACT AERO Inhale 2 puffs into the lungs every 4 (four) hours as needed. 01/10/24   Sagardia, Miguel Jose, MD  Alcohol  Swabs  (ALCOHOL  PADS) 70 % PADS 1 Act by Does not apply route 2 (two) times daily. 04/28/23   [name] Debby CROME, MD  allopurinol  (ZYLOPRIM ) 100 MG tablet Take 1 tablet (100 mg total) by mouth daily. NEEDS FOLLOW UP APPOINTMENT FOR MORE REFILLS 11/28/23   Bensimhon, Toribio SAUNDERS, MD  Aromatic  Inhalants (VICKS VAPOR INHALER IN) Inhale into the lungs as needed.    [provider]  atorvastatin  (LIPITOR ) 10 MG tablet TAKE 1 TABLET BY MOUTH EVERYDAY AT BEDTIME 07/22/23   [name] Debby CROME, MD  Blood Glucose Monitoring Suppl (ONE TOUCH ULTRA 2) w/Device KIT 1 Act by Does not apply route 2 (two) times daily. 04/28/23   [name] Debby CROME, MD  gabapentin  (NEURONTIN ) 100 MG capsule TAKE 1 CAPSULE BY MOUTH EVERYDAY AT BEDTIME 01/15/24   Gershon Donnice SAUNDERS, DPM  hydrALAZINE  (APRESOLINE ) 25 MG tablet TAKE 2 TABLETS (50 MG TOTAL) BY MOUTH 3 (THREE) TIMES DAILY. 09/22/23   Bensimhon, Toribio SAUNDERS, MD  losartan  (COZAAR ) 100 MG tablet Take 1 tablet (100 mg total) by mouth daily. 12/22/23   Alvia Corean CROME, FNP  ONETOUCH ULTRA test strip 1 each by Other route 2 (two) times daily. as directed 04/28/23   [name] Debby CROME, MD  pantoprazole  (PROTONIX ) 40 MG tablet TAKE 1 TABLET BY MOUTH EVERY DAY 01/23/24   [name] Debby CROME, MD  spironolactone  (ALDACTONE ) 25 MG tablet TAKE 1 TABLET (25 MG TOTAL) BY MOUTH DAILY. 12/19/23   Bensimhon, Toribio SAUNDERS, MD  torsemide  (DEMADEX ) 20 MG tablet TAKE 1.5 TABLETS BY MOUTH EVERY DAY 11/28/23   Waddell [name] ORN, MD  TRELEGY ELLIPTA  100-62.5-25 MCG/ACT AEPB INHALE 1 PUFF INTO THE LUNGS DAILY 01/24/24   [name] Debby CROME, MD  XARELTO  15 MG TABS tablet TAKE 1 TABLET (15 MG TOTAL) BY MOUTH DAILY WITH SUPPER 01/09/24   Bensimhon, Toribio SAUNDERS, MD  losartan  (COZAAR ) 50 MG tablet Take 1 tablet (50 mg total) by mouth daily. 12/16/22   Bensimhon, Toribio SAUNDERS, MD     Allergies:    Allergies  Allergen Reactions   Bee Venom Anaphylaxis   Ivp Dye [Iodinated Contrast Media] Anaphylaxis   Ace Inhibitors Other (See Comments)    angioedema   Atenolol Other (See Comments)    severe headaches   Codeine Nausea And Vomiting   Entresto  [Sacubitril -Valsartan ] Other (See Comments)    Chest pain    Isosorbide  Other (See Comments)    Severe headaches   Shrimp [Shellfish Allergy] Swelling   Simvastatin Other (See  Comments)    not sure   Jardiance [Empagliflozin] Other (See Comments)    Caused Boils    Penicillin G Rash and Dermatitis    Social History:   Social History   Socioeconomic History   Marital status: Widowed    Spouse name: Not on file   Number of children: 1   Years of education: 43   Highest education level: Not on file  Occupational History   Occupation: RETIRED [employer]  Tobacco Use   Smoking status: Some Days    Current packs/day: 0.00    Average packs/day: 0.5 packs/day for 56.0 years (28.0 ttl pk-yrs)    Types: Cigarettes    Start date: 11/13/1965    Last attempt to quit: 11/13/2021    Years since quitting: 2.3   Smokeless tobacco: Never   Tobacco comments:    01/02/2024 Patient smokes about 7-8 cigarettes daily    smoking 2 cigarettes/day as of 10/23/20  Vaping Use   Vaping status: Never Used  Substance and Sexual Activity   Alcohol  use: No    Alcohol /week: 0.0 standard drinks of alcohol    Drug use: No   Sexual activity: Not Currently  Other Topics Concern   Not on file  Social History Narrative   Patient reports it being difficult to pay for everything due to outstanding medical bills from hospital stay last year but states she is able to keep up with basic expenses.  Patient does have some concerns with her house's condition due to a water leak- had her roof repaired last month but has some leaking in the front which has affected her porch.  Patient owns her own car and is able to drive herself but has some concerns about the reliability of her car- has gotten taxi to the clinic for appointment in the past when her car broke down.   Social Drivers of Corporate investment banker Strain: Low Risk  (07/12/2023)   Overall Financial Resource Strain (CARDIA)    Difficulty of Paying Living Expenses: Not hard at all  Food Insecurity: No Food Insecurity (07/03/2023)   Hunger Vital Sign    Worried About Running Out of Food in the Last Year: Never true    Ran  Out of Food in the Last Year: Never true  Transportation Needs: No Transportation Needs (07/03/2023)   PRAPARE - Administrator, Civil Service (Medical): No    Lack of Transportation (Non-Medical): No  Physical Activity: Inactive (07/12/2023)   Exercise Vital Sign    Days of Exercise per Week: 0 days    Minutes of Exercise per Session: 0 min  Stress: Stress Concern Present (07/12/2023)   [employer] of Occupational Health - Occupational Stress Questionnaire    Feeling of Stress : Very much  Social Connections: Not on file  Intimate Partner Violence: Not At Risk (07/03/2023)   Humiliation, Afraid, Rape, and Kick questionnaire    Fear of Current or Ex-Partner: No    Emotionally Abused: No    Physically Abused: No    Sexually Abused: No    Family History:   [name] family history includes CVA in her sister; Cancer in her mother; Diabetes in her brother; Heart disease in her father; Other in her father; Prostate cancer (age of onset: 81) in her brother.    Review of Systems: [y] = yes, [ ]  = no    General: Weight gain [ ] ; Weight loss [ ] ; Anorexia [ ] ; Fatigue [ ] ; Fever [ ] ; Chills [ ] ; Weakness [ ]   Cardiac: Chest pain/pressure [ ] ; Resting SOB [ ] ; Exertional SOB [ ] ; Orthopnea [ ] ; Pedal Edema [ ] ; Palpitations [ ] ; Syncope [ ] ; Presyncope [ ] ; Paroxysmal nocturnal dyspnea[ ]   Pulmonary: Cough [ ] ; Wheezing[ ] ; Hemoptysis[ ] ; Sputum [ ] ; Snoring [ ]   GI: Vomiting[ y]; Dysphagia[ ] ; Melena[ ] ; Hematochezia [ ] ; Heartburn[ ] ; Abdominal pain [ y]; Constipation [ ] ; Diarrhea [ ] ;  BRBPR [ ]   GU: Hematuria[ ] ; Dysuria [ ] ; Nocturia[ ]   Vascular: Pain in legs with walking [ ] ; Pain in feet with lying flat [ ] ; Non-healing sores [ ] ; Stroke [ ] ; TIA [ ] ; Slurred speech [ ] ;  Neuro: Headaches[ ] ; Vertigo[ ] ; Seizures[ ] ; Paresthesias[ ] ;Blurred vision [ ] ; Diplopia [ ] ; Vision changes [ ]   Ortho/Skin: Arthritis [ ] ; Joint pain [ ] ; Muscle pain [ ] ; Joint swelling [ ] ;  Back Pain [ ] ; Rash [ ]   Psych: Depression[ ] ; Anxiety[ ]   Heme: Bleeding problems [ ] ; Clotting disorders [ ] ; Anemia [ ]   Endocrine: Diabetes [ ] ; Thyroid  dysfunction[ ]   Physical Exam/Data:   Vitals:   03/06/24 0015 03/06/24 0130 03/06/24 0145 03/06/24 0200  BP: (!) 163/88 (!) 160/111 (!) 152/115 (!) 152/80  Pulse: 64 70 (!) 59 62  Resp: 19 16 17 17   Temp:      TempSrc:      SpO2: 95% 94% 93% 91%  Weight:   74.8 kg    No intake or output data in the 24 hours ending 03/06/24 0225 Filed Weights   03/06/24 0145  Weight: 74.8 kg   Body mass index is 25.45 kg/m.  General: Hunched over nauseous with a vomiting bag next to her. HEENT: normal Neck: JVP 16 Endocrine:  No thryomegaly Vascular: No carotid bruits; FA pulses 2+ bilaterally without bruits  Cardiac: Normal rate and regular rhythm, systolic murmur present in right fifth intercostal space Lungs: Unlabored respirations Abd: soft, nontender, no hepatomegaly  Ext: No edema but cool peripheral extremities Skin: warm and dry  Neuro:  CNs 2-12 intact, no focal abnormalities noted Psych:  Normal affect    EKG:   EKG with atrial fibrillation with normal ventricular response.  Under sensing of single lead pacemaker present no ST segment changes present  Relevant CV Studies:   Laboratory Data:  Chemistry Recent Labs  Lab 03/05/24 2232 03/05/24 2238 03/05/24 2239  NA 143 145 145  K 3.5 3.4* 3.4*  CL 111 112*  --   CO2 18*  --   --   GLUCOSE 147* 139*  --   BUN 28* 30*  --   CREATININE 1.41* 1.30*  --   CALCIUM  9.7  --   --   GFRNONAA 38*  --   --   ANIONGAP 14  --   --     Recent Labs  Lab 03/05/24 2232  PROT 7.6  ALBUMIN  3.8  AST 25  ALT 21  ALKPHOS 116  BILITOT 1.4*   Hematology Recent Labs  Lab 03/05/24 2232 03/05/24 2238 03/05/24 2239  WBC 8.0  --   --   RBC 5.57*  --   --   HGB 17.0* 18.0* 17.7*  HCT 51.4* 53.0* 52.0*  MCV 92.3  --   --   MCH 30.5  --   --   MCHC 33.1  --   --   RDW  17.4*  --   --   PLT 131*  --   --    Cardiac EnzymesNo results for input(s): TROPONINI in the last 168 hours. No results for input(s): TROPIPOC in the last 168 hours.  BNP Recent Labs  Lab 03/05/24 2232  BNP 557.3*    DDimer No results for input(s): DDIMER in the last 168 hours.  Radiology/Studies:  CT ABDOMEN PELVIS WO CONTRAST Result Date: 03/06/2024 EXAM: CT ABDOMEN AND PELVIS WITHOUT CONTRAST 03/06/2024 12:26:05 AM TECHNIQUE: CT of the abdomen  and pelvis was performed without the administration of intravenous contrast. Multiplanar reformatted images are provided for review. Automated exposure control, iterative reconstruction, and/or weight based adjustment of the mA/kV was utilized to reduce the radiation dose to as low as reasonably achievable. COMPARISON: 01/11/2023 CLINICAL HISTORY: Abdominal pain, acute, nonlocalized. FINDINGS: LOWER CHEST: No acute abnormality. LIVER: Mildly nodular hepatic contour, nonspecific. GALLBLADDER AND BILE DUCTS: Gallbladder is unremarkable. No biliary ductal dilatation. SPLEEN: No acute abnormality. PANCREAS: No acute abnormality. ADRENAL GLANDS: No acute abnormality. KIDNEYS, URETERS AND BLADDER: No stones in the kidneys or ureters. No hydronephrosis. No perinephric or periureteral stranding. Urinary bladder is unremarkable. GI AND BOWEL: Normal appendix (series 3, image 46). Stomach demonstrates no acute abnormality. There is no bowel obstruction. No bowel wall thickening. PERITONEUM AND RETROPERITONEUM: No ascites. No free air. VASCULATURE: Atherosclerotic calcifications of the abdominal aorta and branch vessels. LYMPH NODES: No lymphadenopathy. REPRODUCTIVE ORGANS: Calcified uterine fibroids. BONES AND SOFT TISSUES: Mild degenerative changes of the visualized thoracolumbar spine. No acute osseous abnormality. No focal soft tissue abnormality. IMPRESSION: 1. No acute findings in the abdomen or pelvis. Electronically signed by: Pinkie Pebbles MD  03/06/2024 12:32 AM EDT RP Workstation: [id number]   DG Chest Portable 1 View Result Date: 03/05/2024 EXAM: 1 VIEW XRAY OF THE CHEST 03/05/2024 11:12:00 PM COMPARISON: 06/12/2023 CLINICAL HISTORY: Encounter for shortness of breath. FINDINGS: LUNGS AND PLEURA: Mild interstitial edema. No focal pulmonary opacity. No pleural effusion. No pneumothorax. HEART AND MEDIASTINUM: Cardiomegaly. Median sternotomy. BONES AND SOFT TISSUES: No acute osseous abnormality. Left subclavian pacemaker. IMPRESSION: 1. Cardiomegaly with mild interstitial edema. Electronically signed by: Pinkie Pebbles MD 03/05/2024 11:23 PM EDT RP Workstation: [id number]    Assessment and Plan:   Ms. Lacson presented to the emergency department with diaphoresis and significant nausea and vomiting for several hours.  Upon arrival to the emergency department even though she was hypertensive with a blood pressure initially 190/92, per the ED she was cool clammy with persistent nausea. Her symptoms seem to have improved within 2 hours of arrival in that timeframe she also received diuresis. However I am not sure if the diuresis aided in her improvement of her symptoms. Her CT abdomen and pelvis without contrast is without acute abnormality. Her LFTs and lipase are within normal limits.    Given she has a history of cardiogenic shock and RV dysfunction, pulmonary edema chest chest x-ray, cool extremities, worsening kidney function, and lactic acidosis upon arrival we started milrinone  overnight to assist with perfusion. Her examination is not typical of RV dysfunction given the lack of lower extremity edema, however I believe that it is likely due to the acuity of her symptoms.  Upon review of the heart failure team notes it does seem as though she does presents with abdominal pain in the setting of decompensated heart failure. We will treat as such.  Plan: - Begin milrionine 0.125 mcg/min (gFR 38, prior from 54)  - s/p diuresis with furosemide   80 mg  - Limited echocardiogram tomorrow to evaluate RV and LV function - Repeat lactate 3 hours after milrinone  initiation - D-dimer added onto prior labs - Biotronik interrogation obtained by ED pending; under sensing present on EKG. Can consider pacemaker changes in the morning - We will consider right heart cath tomorrow morning   Signed, Merlene JAYSON Blood, MD  03/06/2024 2:25 AM

## 2024-03-12 NOTE — Progress Notes (Signed)
 Remote pacemaker transmission.

## 2024-03-15 ENCOUNTER — Telehealth (HOSPITAL_COMMUNITY): Payer: Self-pay

## 2024-03-15 NOTE — Telephone Encounter (Signed)
 Called to confirm/remind patient of their appointment at [facility] on 03/18/24.   Appointment:   [] Confirmed  [x] Left mess   [] No answer/No voice mail  [] VM Full/unable to leave message  [] Phone not in service  And to bring in all medications and/or complete list.

## 2024-03-18 ENCOUNTER — Ambulatory Visit (HOSPITAL_COMMUNITY)
Admission: RE | Admit: 2024-03-18 | Discharge: 2024-03-18 | Disposition: A | Discharge status: Home / Self Care | Source: Ambulatory Visit | Attending: Cardiology | Admitting: Cardiology

## 2024-03-18 ENCOUNTER — Encounter (HOSPITAL_COMMUNITY): Payer: Self-pay

## 2024-03-18 ENCOUNTER — Ambulatory Visit (HOSPITAL_COMMUNITY): Payer: Self-pay | Admitting: Cardiology

## 2024-03-18 VITALS — BP 140/88 | HR 59 | Ht 67.0 in | Wt 166.2 lb

## 2024-03-18 DIAGNOSIS — Z79899 Other long term (current) drug therapy: Secondary | ICD-10-CM | POA: Insufficient documentation

## 2024-03-18 DIAGNOSIS — Z951 Presence of aortocoronary bypass graft: Secondary | ICD-10-CM | POA: Diagnosis not present

## 2024-03-18 DIAGNOSIS — I10 Essential (primary) hypertension: Secondary | ICD-10-CM | POA: Diagnosis not present

## 2024-03-18 DIAGNOSIS — Z950 Presence of cardiac pacemaker: Secondary | ICD-10-CM | POA: Insufficient documentation

## 2024-03-18 DIAGNOSIS — I340 Nonrheumatic mitral (valve) insufficiency: Secondary | ICD-10-CM | POA: Diagnosis not present

## 2024-03-18 DIAGNOSIS — Z8249 Family history of ischemic heart disease and other diseases of the circulatory system: Secondary | ICD-10-CM | POA: Diagnosis not present

## 2024-03-18 DIAGNOSIS — I130 Hypertensive heart and chronic kidney disease with heart failure and stage 1 through stage 4 chronic kidney disease, or unspecified chronic kidney disease: Secondary | ICD-10-CM | POA: Insufficient documentation

## 2024-03-18 DIAGNOSIS — I2510 Atherosclerotic heart disease of native coronary artery without angina pectoris: Secondary | ICD-10-CM

## 2024-03-18 DIAGNOSIS — N183 Chronic kidney disease, stage 3 unspecified: Secondary | ICD-10-CM | POA: Diagnosis not present

## 2024-03-18 DIAGNOSIS — Z7901 Long term (current) use of anticoagulants: Secondary | ICD-10-CM | POA: Insufficient documentation

## 2024-03-18 DIAGNOSIS — I5042 Chronic combined systolic (congestive) and diastolic (congestive) heart failure: Secondary | ICD-10-CM

## 2024-03-18 DIAGNOSIS — E785 Hyperlipidemia, unspecified: Secondary | ICD-10-CM | POA: Insufficient documentation

## 2024-03-18 DIAGNOSIS — Z5982 Transportation insecurity: Secondary | ICD-10-CM | POA: Diagnosis not present

## 2024-03-18 DIAGNOSIS — N1832 Chronic kidney disease, stage 3b: Secondary | ICD-10-CM | POA: Diagnosis not present

## 2024-03-18 DIAGNOSIS — F17210 Nicotine dependence, cigarettes, uncomplicated: Secondary | ICD-10-CM | POA: Insufficient documentation

## 2024-03-18 DIAGNOSIS — Z720 Tobacco use: Secondary | ICD-10-CM

## 2024-03-18 DIAGNOSIS — I5022 Chronic systolic (congestive) heart failure: Secondary | ICD-10-CM

## 2024-03-18 DIAGNOSIS — J4489 Other specified chronic obstructive pulmonary disease: Secondary | ICD-10-CM | POA: Diagnosis not present

## 2024-03-18 DIAGNOSIS — J449 Chronic obstructive pulmonary disease, unspecified: Secondary | ICD-10-CM

## 2024-03-18 DIAGNOSIS — I4821 Permanent atrial fibrillation: Secondary | ICD-10-CM | POA: Insufficient documentation

## 2024-03-18 DIAGNOSIS — I4891 Unspecified atrial fibrillation: Secondary | ICD-10-CM

## 2024-03-18 LAB — BASIC METABOLIC PANEL WITH GFR
Anion gap: 9 (ref 5–15)
BUN: 25 mg/dL — ABNORMAL HIGH (ref 8–23)
CO2: 19 mmol/L — ABNORMAL LOW (ref 22–32)
Calcium: 9.2 mg/dL (ref 8.9–10.3)
Chloride: 115 mmol/L — ABNORMAL HIGH (ref 98–111)
Creatinine, Ser: 1.28 mg/dL — ABNORMAL HIGH (ref 0.44–1.00)
GFR, Estimated: 43 mL/min — ABNORMAL LOW (ref >60)
Glucose, Bld: 122 mg/dL — ABNORMAL HIGH (ref 70–99)
Potassium: 3.9 mmol/L (ref 3.5–5.1)
Sodium: 143 mmol/L (ref 135–145)

## 2024-03-18 LAB — BRAIN NATRIURETIC PEPTIDE: B Natriuretic Peptide: 461.8 pg/mL — ABNORMAL HIGH (ref 0.0–100.0)

## 2024-03-18 MED ORDER — HYDRALAZINE HCL 25 MG PO TABS: 75.0000 mg | ORAL_TABLET | Freq: Three times a day (TID) | ORAL | 11 refills | Status: DC

## 2024-03-18 NOTE — Progress Notes (Signed)
 [facility] Note  PCP: Joshua Debby CROME, MD Primary Cardiologist: Dr. Jeffrie  EP: Dr Waddell HF MD: Dr Cherrie   HPI: [name] [name] is a 78 y.o. female with h/o HTN, former tobacco abuse, permanent AF, CAD s/p CABG x 2 (LIMA to LAD and SVG to OM) in 2016, Chronic combined HF, Ischemic CM, HLD, and Biotronik PPM.   She was initially noted to be in atrial fibrillation in 2016 with a reduced LVEF, leading to ischemic workup with a cardiac cath showing severe multivessel CAD with LM dz -> CABG x2 utilizing LIMA to LAD, SVG to OM.   Admitted 1/19 for AF with RVR in setting of Influenza A. EP was consulted to discuss plans for Tikosyn vs. ablation vs. medication rate control however, due to her known medication noncompliance she was found to not be an ablation nor antiarrhythmic candidate   2019 back in Afib. She was started on amiodarone  & Entresto .  She underwent successful atrial flutter TEE/DCCV on 12/25/17.  Later went back in A fib.   Admitted 3/21 for iron-def anemia. Hgb 5.4 EGD/colonoscopy which were unremarkable and capsule endoscopy which showed duodenal ulcer with bleeding and stigmata.  GI recommended oral Protonix  and outpatient follow-up.  Admitted 6/22 for AV node ablation and PPM due to uncontrolled heart rates with A fib. Underwent Biotronix single lead PPM. Toprol  XL and digoxin  stopped. Carvedilol  25 mg bid started. She was discharged 01/19/21.    Admitted 6/22 with a/c systolic heart failure. Diuresed with IV lasix  and transitioned to torsemide  40 mg daily. EF 25-30% with severe MR/TR.    Admitted 10/22 for COPD exacerbation with a component of A/C CHF, ran out of torsemide  several days prior  Echo 07/30/21: EF 25-30% Moderate RV dysfunction. Severe biatrial enlargement. Severe central MR/TR.   Was seen at [facility] in 4/23 for second opinion. Restarted Entresto  24/26 bid. Did not tolerate due to CP. Agreed she might candidate for mTEER  down the road.    Admitted 10/23 with a/c HF, worrisome for low output. Echo showed EF 20%, severe LV dysfunction, RV moderately down.  Diuresed with IV lasix  and started on milrinone . RHC showed low volume and CI 1.9 on milrinone , improved with small IVF bolus. Drips weaned and GDMT added back. BiDil  increased with elevated BP. Referred back to Paramedicine to help with meds. Discharged home, weight 183 lbs.  EP attempted to upgrade her PPM to ICD on 04/08 but she was found to have occlusion of left subclavian vein and procedure was aborted. Saw Dr. Waddell 04/25 and offered lead extraction and insertion of BiV ICD with removal of pacing lead and insertion of CS lead, left bundle area lead and ICD lead. She was not interested. She appeared volume overloaded and Torsemide  was increased.   Admitted 4/24 with a/c HF, 2/2 medical non-adherence. Diuresed with IV lasix . Echo showed EF 25%, RV moderately reduced, RVSP 55 mmHg, severe BAE, moderate MR. GDMT titrated, and she was discharged home, weight 171 lbs.  Repeat echo 6/24 with worsened EF 25%, mod LVH, mildly reduced RV, mod MR, sev TR. Likely in the setting of now persistent AF.    Recent admission 03/05/24 with nausea and vomiting. Felt not to be in HF decompensation.   She returns today for heart failure follow up. Overall feeling okay. NYHA III. Reports dyspnea and fatigue with  exertion. Having pain in her R foot related to fracture, reports that ortho wants to take out pins. Walking around house, however is painful.  Denies chest pain, orthopnea, palpitations, dizziness, and abnormal bleeding. Recently lost her older sister, has since started smoking again. Reports that she is trying to cut back. Able to perform ADLs. Appetite okay. Compliant with all medications.  Cardiac Studies   - Echo (6/25): EF 25%, mod LVH, mildly reduced RV, mod MR, sev TR - Echo (8/24): EF 30-35%  - Echo (4/24): EF 25%, RV moderately down, moderate MR - RHC (10/23): on  milrinone  0.25. RA 1, PAP 32/13 (18), PCW 2, CO/CI (TD) 4.1/2.1 (after 175cc bolus), PVR 4.2 - Echo (10/23): EF 20%, severe LV dysfunction, RV moderately down - Echo (4/22): EF 25-30% RV moderately reduced  - Echo (11/21): EF 30-35% RV normal  - Echo (4/21): EF 55-60%  - Stress Test (1/20): Low Risk EF 44% - Echo (12/19): EF 30-35% RV mildly dilated - Echo (5/18): EF stable 50-55% - Echo (3/16): EF 35-40%.  ROS: All systems negative except as listed in HPI, PMH and Problem List.  SH:  Social History   Socioeconomic History   Marital status: Widowed    Spouse name: Not on file   Number of children: 1   Years of education: 67   Highest education level: Not on file  Occupational History   Occupation: RETIRED [employer]  Tobacco Use   Smoking status: Some Days    Current packs/day: 0.00    Average packs/day: 0.5 packs/day for 56.0 years (28.0 ttl pk-yrs)    Types: Cigarettes    Start date: 11/13/1965    Last attempt to quit: 11/13/2021    Years since quitting: 2.3   Smokeless tobacco: Never   Tobacco comments:    01/02/2024 Patient smokes about 7-8 cigarettes daily    smoking 2 cigarettes/day as of 10/23/20  Vaping Use   Vaping status: Never Used  Substance and Sexual Activity   Alcohol  use: No    Alcohol /week: 0.0 standard drinks of alcohol    Drug use: No   Sexual activity: Not Currently  Other Topics Concern   Not on file  Social History Narrative   Patient reports it being difficult to pay for everything due to outstanding medical bills from hospital stay last year but states she is able to keep up with basic expenses.  Patient does have some concerns with her house's condition due to a water leak- had her roof repaired last month but has some leaking in the front which has affected her porch.  Patient owns her own car and is able to drive herself but has some concerns about the reliability of her car- has gotten taxi to the clinic for appointment in the past when her  car broke down.   Social Drivers of Corporate investment banker Strain: Low Risk  (07/12/2023)   Overall Financial Resource Strain (CARDIA)    Difficulty of Paying Living Expenses: Not hard at all  Food Insecurity: Food Insecurity Present (03/06/2024)   Hunger Vital Sign    Worried About Running Out of Food in the Last Year: Sometimes true    Ran Out of Food in the Last Year: Sometimes true  Transportation Needs: Unmet Transportation Needs (03/06/2024)   PRAPARE - Transportation    Lack of Transportation (Medical): Yes    Lack of Transportation (Non-Medical): Yes  Physical Activity: Inactive (07/12/2023)   Exercise Vital Sign    Days  of Exercise per Week: 0 days    Minutes of Exercise per Session: 0 min  Stress: Stress Concern Present (07/12/2023)   [employer] of Occupational Health - Occupational Stress Questionnaire    Feeling of Stress : Very much  Social Connections: Unknown (03/06/2024)   Social Connection and Isolation Panel    Frequency of Communication with Friends and Family: Three times a week    Frequency of Social Gatherings with Friends and Family: Once a week    Attends Religious Services: 1 to 4 times per year    Active Member of [employer] or Organizations: Yes    Attends Banker Meetings: 1 to 4 times per year    Marital Status: Patient declined  Intimate Partner Violence: Not At Risk (03/06/2024)   Humiliation, Afraid, Rape, and Kick questionnaire    Fear of Current or Ex-Partner: No    Emotionally Abused: No    Physically Abused: No    Sexually Abused: No   FH:  Family History  Problem Relation Age of Onset   Cancer Mother        LYMPHOMA   Heart disease Father    Other Father        TB   CVA Sister    Prostate cancer Brother 78   Diabetes Brother    Past Medical History:  Diagnosis Date   (HFimpEF) heart failure with improved ejection fraction (HCC)    a. 07/2018 Echo: EF 30-35%; b. 11/2019 Echo: EF 55-60%, no rwma, Gr2 DD, Nl RV  size/fxn. Mild BAE. Mild MR/AI.   Arthritis    Asthma    Atopic dermatitis    CAD (coronary artery disease)    a. 2016 s/p CABG x 2 (LIMA->LAD, VG->OM); b. 08/2018 MV: EF 44%, no ischemia/infact.   Cardiac arrest (HCC) 10/2014   Cardiomyopathy, ischemic    a. 07/2018 Echo: EF 30-35%; 11/2019 Echo: EF 55-60%.   Carotid arterial disease (HCC)    a. 11/2019 Carotid U/S   CHF (congestive heart failure) (HCC)    CKD (chronic kidney disease), stage III (HCC)    COPD (chronic obstructive pulmonary disease) (HCC)    Diabetes mellitus without complication (HCC)    Dysrhythmia    Esophageal dilatation 2013   GERD (gastroesophageal reflux disease)    Glaucoma    Gout    Headache    Hearing loss    History of blood transfusion    Hypertension    Hypokalemia    Idiopathic angioedema    LGI bleed 08/06/2017   a. felt to be hemorrhoidal during that admission (no drop in Hgb).   Lower back pain    Paroxysmal atrial fibrillation (HCC)    a. Dx 2016-->h/o difficult to control rates (complicated by noncompliance), not felt to be a candidate for ablation or antiarrhythmic due to noncompliance; b. Recurrent AF 2021 - converted w/ IV dilt; c. CHA2DS2VASc = 7-->Xarelto .   Personal history of noncompliance with medical treatment, presenting hazards to health    Pneumonia    Prediabetes    Presence of permanent cardiac pacemaker    Right leg numbness    S/P CABG x 2 with clipping of LA appendage 11/14/2014   LIMA to LAD, SVG to OM, EVH via right thigh   Urine incontinence    Uterine fibroid    Current Outpatient Medications  Medication Sig Dispense Refill   acetaminophen  (TYLENOL ) 500 MG tablet Take 500 mg by mouth as needed for mild pain (pain score 1-3) or  moderate pain (pain score 4-6).     Albuterol -Budesonide  (AIRSUPRA ) 90-80 MCG/ACT AERO Inhale 2 puffs into the lungs every 4 (four) hours as needed. 10.7 g 1   Alcohol  Swabs  (ALCOHOL  PADS) 70 % PADS 1 Act by Does not apply route 2 (two) times  daily. 100 each 3   allopurinol  (ZYLOPRIM ) 100 MG tablet Take 1 tablet (100 mg total) by mouth daily. NEEDS FOLLOW UP APPOINTMENT FOR MORE REFILLS 90 tablet 1   Aromatic Inhalants (VICKS VAPOR INHALER IN) Inhale into the lungs as needed.     atorvastatin  (LIPITOR ) 10 MG tablet TAKE 1 TABLET BY MOUTH EVERYDAY AT BEDTIME 90 tablet 0   Blood Glucose Monitoring Suppl (ONE TOUCH ULTRA 2) w/Device KIT 1 Act by Does not apply route 2 (two) times daily. 1 kit 3   gabapentin  (NEURONTIN ) 100 MG capsule TAKE 1 CAPSULE BY MOUTH EVERYDAY AT BEDTIME 30 capsule 1   hydrALAZINE  (APRESOLINE ) 25 MG tablet TAKE 2 TABLETS (50 MG TOTAL) BY MOUTH 3 (THREE) TIMES DAILY. 540 tablet 1   losartan  (COZAAR ) 100 MG tablet Take 1 tablet (100 mg total) by mouth daily. 90 tablet 1   ondansetron  (ZOFRAN ) 4 MG tablet Take 1 tablet (4 mg total) by mouth every 8 (eight) hours as needed for nausea or vomiting. 20 tablet 0   ONETOUCH ULTRA test strip 1 each by Other route 2 (two) times daily. as directed 100 each 3   pantoprazole  (PROTONIX ) 40 MG tablet TAKE 1 TABLET BY MOUTH EVERY DAY 90 tablet 0   spironolactone  (ALDACTONE ) 25 MG tablet TAKE 1 TABLET (25 MG TOTAL) BY MOUTH DAILY. 90 tablet 3   torsemide  (DEMADEX ) 20 MG tablet TAKE 1.5 TABLETS BY MOUTH EVERY DAY 135 tablet 1   TRELEGY ELLIPTA  100-62.5-25 MCG/ACT AEPB INHALE 1 PUFF INTO THE LUNGS DAILY (Patient not taking: Reported on 03/06/2024) 60 each 2   XARELTO  15 MG TABS tablet TAKE 1 TABLET (15 MG TOTAL) BY MOUTH DAILY WITH SUPPER 30 tablet 8   No current facility-administered medications for this visit.   There were no vitals taken for this visit.  Wt Readings from Last 3 Encounters:  03/06/24 75.1 kg (165 lb 8 oz)  01/30/24 75.4 kg (166 lb 3.2 oz)  01/26/24 74.2 kg (163 lb 9.6 oz)   PHYSICAL EXAM: General: Well appearing. Arrived by [facility] Cardiac: JVP ~7cm with TR reflux. S1 and S2 present. 2/6 apical systolic murmur  Extremities: Warm and dry.  No peripheral edema. + R  foot wrap Neuro: Alert and oriented x3. Affect flat.  ASSESSMENT & PLAN: 1. Chronic Systolic Heart Failure - Suspect Combined ICM /NICM with previous A fib RVR. RV Pacing? - In 2021 EF improved 55-60% but over the last year EF has been down 25-30 % with severe MR/TR - Echo (10/23): EF 20%, moderate LVH, RV moderately reduced, RVSP 46 mmHg, moderate BAE, mild to moderate MR, moderate TR, - Echo (4/24): EF 25%, RV moderately reduced, RVSP 55 mmHg, severe BAE, moderate MR - Echo (8/24): EF 30-35% severe BAE, moderate MR - Echo (6/25) EF 25% severe MR, Severe TR, RV moderately reduced, PAP in 70s.  - NYHA III, confounded by COPD. Limited mainly by orthopedic issues. Euvolemic. BMET/BNP today - continue torsemide  20 mg daily. Does not take when she goes out.  - continue spironolactone  25 mg daily. - continue losartan  100 mg daily (failed Entresto  with chest pain).  - increase hydralazine  to 75 mg tid, refuses Imdur  d/t headaches. - No SGLT2i  due UTI/yeast infections.  - No beta blocker high burden of RV pacing on device checks (61% last check 3/24) - Would likely benefit from upgrade to CRT. Upgrade PPM to ICD aborted 8/24 d/t occlusion of left subclavian vein. Dr. Waddell saw patient and offered lead extraction and insertion of CRT, which she declined.  2. MR - moderate on echo 8/24   3. Permanent A fib  - Biotronik dual chamber PPM/AV nodal ablation 2022.  - Rate controlled. ECG 7/22 reviewed AF 77 bpm.  - Continue Xarelto  15 mg daily (adjusted for CrCl).    4. CAD - s/p CABG x2 2016 (LIMA to LAD and SVG to OM)  - No s/s angina  - No ASA given AC - Continue statin.   5. COPD/tobacco abuse - Continues to smoke. Discussed cessation.  6. CKD IIIb - Baseline SCr 1.2-1.4  7. Pre-Operative CV Risk stratification: Patient is high CV risk for general anesthesia. Would avoid surgery unless absolutely necessary. Would avoid general anesthesia at all cost. Has been discussed with Dr.  Cherrie  Follow up in 3 month with Dr. Bensimhon   [place] Lonisha Bobby, NP  03/18/24

## 2024-03-18 NOTE — Patient Instructions (Addendum)
 Thank you for coming in today  If you had labs drawn today, any labs that are abnormal the clinic will call you No news is good news  Medications: Increase Hydralazine  to 75 mg 3 tablets 3 times daily   Follow up appointments:  Your physician recommends that you schedule a follow-up appointment in:  2-3 months With Dr. Cherrie Please call our office to schedule the follow-up appointment in September for October/November 2025 .     Do the following things EVERYDAY: Weigh yourself in the morning before breakfast. Write it down and keep it in a log. Take your medicines as prescribed Eat low salt foods--Limit salt (sodium) to 2000 mg per day.  Stay as active as you can everyday Limit all fluids for the day to less than 2 liters   At the [facility], you and your health needs are our priority. As part of our continuing mission to provide you with exceptional heart care, we have created designated Provider Care Teams. These Care Teams include your primary Cardiologist (physician) and Advanced Practice Providers (APPs- Physician Assistants and Nurse Practitioners) who all work together to provide you with the care you need, when you need it.   You may see any of the following providers on your designated Care Team at your next follow up: Dr Toribio Cherrie Dr Ezra Shuck Dr. Ria Gardenia Greig Lenetta, NP Caffie Shed, [place] [facility] [place], [place] Beckey Coe, NP Tinnie Redman, PharmD   Please be sure to bring in all your medications bottles to every appointment.    Thank you for choosing [facility]  If you have any questions or concerns before your next appointment please send us  a message through [employer] or call our office at [phone].    TO LEAVE A MESSAGE FOR THE NURSE SELECT OPTION 2, PLEASE LEAVE A MESSAGE INCLUDING: YOUR NAME DATE OF BIRTH CALL BACK NUMBER REASON FOR CALL**this is  important as we prioritize the call backs  YOU WILL RECEIVE A CALL BACK THE SAME DAY AS LONG AS YOU CALL BEFORE 4:00 PM

## 2024-03-22 ENCOUNTER — Other Ambulatory Visit: Payer: Self-pay | Admitting: Podiatry

## 2024-03-31 ENCOUNTER — Other Ambulatory Visit: Payer: Self-pay

## 2024-03-31 ENCOUNTER — Emergency Department (HOSPITAL_COMMUNITY)

## 2024-03-31 ENCOUNTER — Emergency Department (HOSPITAL_COMMUNITY)
Admission: EM | Admit: 2024-03-31 | Discharge: 2024-03-31 | Disposition: A | Discharge status: Home / Self Care | Attending: Emergency Medicine | Admitting: Emergency Medicine

## 2024-03-31 ENCOUNTER — Encounter (HOSPITAL_COMMUNITY): Payer: Self-pay

## 2024-03-31 DIAGNOSIS — Z950 Presence of cardiac pacemaker: Secondary | ICD-10-CM | POA: Insufficient documentation

## 2024-03-31 DIAGNOSIS — R109 Unspecified abdominal pain: Secondary | ICD-10-CM | POA: Insufficient documentation

## 2024-03-31 DIAGNOSIS — I509 Heart failure, unspecified: Secondary | ICD-10-CM | POA: Insufficient documentation

## 2024-03-31 DIAGNOSIS — R600 Localized edema: Secondary | ICD-10-CM | POA: Diagnosis not present

## 2024-03-31 DIAGNOSIS — R0602 Shortness of breath: Secondary | ICD-10-CM | POA: Insufficient documentation

## 2024-03-31 DIAGNOSIS — I517 Cardiomegaly: Secondary | ICD-10-CM | POA: Diagnosis not present

## 2024-03-31 LAB — COMPREHENSIVE METABOLIC PANEL WITH GFR
ALT: 16 U/L (ref 0–44)
AST: 20 U/L (ref 15–41)
Albumin: 3.7 g/dL (ref 3.5–5.0)
Alkaline Phosphatase: 111 U/L (ref 38–126)
Anion gap: 12 (ref 5–15)
BUN: 27 mg/dL — ABNORMAL HIGH (ref 8–23)
CO2: 21 mmol/L — ABNORMAL LOW (ref 22–32)
Calcium: 9.9 mg/dL (ref 8.9–10.3)
Chloride: 109 mmol/L (ref 98–111)
Creatinine, Ser: 1.33 mg/dL — ABNORMAL HIGH (ref 0.44–1.00)
GFR, Estimated: 41 mL/min — ABNORMAL LOW (ref >60)
Glucose, Bld: 116 mg/dL — ABNORMAL HIGH (ref 70–99)
Potassium: 3.6 mmol/L (ref 3.5–5.1)
Sodium: 142 mmol/L (ref 135–145)
Total Bilirubin: 1.5 mg/dL — ABNORMAL HIGH (ref 0.0–1.2)
Total Protein: 7.4 g/dL (ref 6.5–8.1)

## 2024-03-31 LAB — CBC WITH DIFFERENTIAL/PLATELET
Abs Immature Granulocytes: 0.02 K/uL (ref 0.00–0.07)
Basophils Absolute: 0.1 K/uL (ref 0.0–0.1)
Basophils Relative: 1 %
Eosinophils Absolute: 0.1 K/uL (ref 0.0–0.5)
Eosinophils Relative: 1 %
HCT: 50.6 % — ABNORMAL HIGH (ref 36.0–46.0)
Hemoglobin: 16.3 g/dL — ABNORMAL HIGH (ref 12.0–15.0)
Immature Granulocytes: 0 %
Lymphocytes Relative: 26 %
Lymphs Abs: 1.6 K/uL (ref 0.7–4.0)
MCH: 29.4 pg (ref 26.0–34.0)
MCHC: 32.2 g/dL (ref 30.0–36.0)
MCV: 91.2 fL (ref 80.0–100.0)
Monocytes Absolute: 0.4 K/uL (ref 0.1–1.0)
Monocytes Relative: 7 %
Neutro Abs: 3.9 K/uL (ref 1.7–7.7)
Neutrophils Relative %: 65 %
Platelets: 108 K/uL — ABNORMAL LOW (ref 150–400)
RBC: 5.55 MIL/uL — ABNORMAL HIGH (ref 3.87–5.11)
RDW: 17.2 % — ABNORMAL HIGH (ref 11.5–15.5)
WBC: 6.1 K/uL (ref 4.0–10.5)
nRBC: 0 % (ref 0.0–0.2)

## 2024-03-31 LAB — I-STAT CG4 LACTIC ACID, ED: Lactic Acid, Venous: 1.1 mmol/L (ref 0.5–1.9)

## 2024-03-31 LAB — LIPASE, BLOOD: Lipase: 27 U/L (ref 11–51)

## 2024-03-31 LAB — BRAIN NATRIURETIC PEPTIDE: B Natriuretic Peptide: 942.5 pg/mL — ABNORMAL HIGH (ref 0.0–100.0)

## 2024-03-31 LAB — TROPONIN I (HIGH SENSITIVITY)
Troponin I (High Sensitivity): 50 ng/L — ABNORMAL HIGH (ref <18)
Troponin I (High Sensitivity): 46 ng/L — ABNORMAL HIGH (ref <18)

## 2024-03-31 MED ORDER — IPRATROPIUM-ALBUTEROL 0.5-2.5 (3) MG/3ML IN SOLN
3.0000 mL | Freq: Once | RESPIRATORY_TRACT | Status: AC
  Administered 2024-03-31: 3 mL via RESPIRATORY_TRACT
  Filled 2024-03-31: qty 3

## 2024-03-31 MED ORDER — SUCRALFATE 1 G PO TABS: 1.0000 g | ORAL_TABLET | Freq: Three times a day (TID) | ORAL | 0 refills | Status: DC

## 2024-03-31 NOTE — ED Provider Notes (Signed)
 [place] EMERGENCY DEPARTMENT AT [facility] Provider Note   CSN: [id number] Arrival date & time: 03/31/24  1153     Patient presents with: Shortness of Breath   [name] [name] is a 78 y.o. female.   78 year old female presenting with multiple complaints.  Patient is a poor historian, initially she tells me that she is here for shortness of breath that has been worsening over 3 days, however when asked about this again she says, it is no worse than it normally is.  She tells me that she was recently seen by her PCP for nasal congestion and started on a medication, she is unsure if this was an antibiotic but tells me that she has only been taking half of what was directed, she reports occasional cause but no different from her baseline without change in sputum production. Patient was hospitalized several weeks ago for nausea/vomiting, she tells me today that she is here for abdominal pain, when asked if this has been going on since that hospitalization she tells me it has been going on a lot longer than that.  She describes a mild drawing abdominal pain after eating meals, denies nausea/vomiting/diarrhea/fever.  She tells me she has a history of a GI bleed in the past, denies hematochezia/melena.  She has a history of permanent A-fib on Xarelto , CHF with pacemaker, COPD.  She is not on oxygen  at home.  She reports occasional lower extremity edema, R>L, which she attributes to a previous injury to her right lower extremity requiring rods and pins. She denies chest pain, palpitations.    Shortness of Breath      Prior to Admission medications   Medication Sig Start Date End Date Taking? Authorizing Provider  acetaminophen  (TYLENOL ) 500 MG tablet Take 500 mg by mouth as needed for mild pain (pain score 1-3) or moderate pain (pain score 4-6).    [provider]  Albuterol -Budesonide  (AIRSUPRA ) 90-80 MCG/ACT AERO Inhale 2 puffs into the lungs every 4 (four) hours as  needed. 01/10/24   Sagardia, Miguel Jose, MD  Alcohol  Swabs  (ALCOHOL  PADS) 70 % PADS 1 Act by Does not apply route 2 (two) times daily. 04/28/23   Joshua Debby CROME, MD  allopurinol  (ZYLOPRIM ) 100 MG tablet Take 1 tablet (100 mg total) by mouth daily. NEEDS FOLLOW UP APPOINTMENT FOR MORE REFILLS 11/28/23   Bensimhon, Toribio SAUNDERS, MD  Aromatic Inhalants (VICKS VAPOR INHALER IN) Inhale into the lungs as needed.    [provider]  atorvastatin  (LIPITOR ) 10 MG tablet TAKE 1 TABLET BY MOUTH EVERYDAY AT BEDTIME 07/22/23   Joshua Debby CROME, MD  Blood Glucose Monitoring Suppl (ONE TOUCH ULTRA 2) w/Device KIT 1 Act by Does not apply route 2 (two) times daily. 04/28/23   Joshua Debby CROME, MD  gabapentin  (NEURONTIN ) 100 MG capsule TAKE 1 CAPSULE BY MOUTH EVERYDAY AT BEDTIME 03/22/24   Gershon Donnice SAUNDERS, DPM  hydrALAZINE  (APRESOLINE ) 25 MG tablet Take 3 tablets (75 mg total) by mouth 3 (three) times daily. 03/18/24   Lee, [place], NP  losartan  (COZAAR ) 100 MG tablet Take 1 tablet (100 mg total) by mouth daily. 12/22/23   Alvia Corean CROME, FNP  ondansetron  (ZOFRAN ) 4 MG tablet Take 1 tablet (4 mg total) by mouth every 8 (eight) hours as needed for nausea or vomiting. 03/06/24   Danford, Lonni SQUIBB, MD  [facility] ULTRA test strip 1 each by Other route 2 (two) times daily. as directed 04/28/23   Joshua Debby CROME, MD  pantoprazole  (PROTONIX ) 40 MG tablet TAKE 1 TABLET BY MOUTH EVERY DAY 01/23/24   Joshua Debby CROME, MD  spironolactone  (ALDACTONE ) 25 MG tablet TAKE 1 TABLET (25 MG TOTAL) BY MOUTH DAILY. 12/19/23   Bensimhon, Toribio SAUNDERS, MD  torsemide  (DEMADEX ) 20 MG tablet TAKE 1.5 TABLETS BY MOUTH EVERY DAY Patient taking differently: Take 20 mg by mouth daily. 11/28/23   Waddell Danelle ORN, MD  TRELEGY ELLIPTA  100-62.5-25 MCG/ACT AEPB INHALE 1 PUFF INTO THE LUNGS DAILY 01/24/24   Joshua Debby CROME, MD  XARELTO  15 MG TABS tablet TAKE 1 TABLET (15 MG TOTAL) BY MOUTH DAILY WITH SUPPER 01/09/24   Bensimhon, Toribio SAUNDERS, MD  losartan   (COZAAR ) 50 MG tablet Take 1 tablet (50 mg total) by mouth daily. 12/16/22   Bensimhon, Toribio SAUNDERS, MD    Allergies: Bee venom, Ivp dye [iodinated contrast media], Ace inhibitors, Atenolol, Codeine, Entresto  [sacubitril -valsartan ], Isosorbide , Shrimp [shellfish allergy], Simvastatin, Jardiance [empagliflozin], and Penicillin g    Review of Systems  Respiratory:  Positive for shortness of breath.     Updated Vital Signs  Vitals:   03/31/24 1445 03/31/24 1515 03/31/24 1554 03/31/24 1623  BP:   (!) 164/101 (!) 160/100  Pulse:   83 80  Resp: (!) 31 (!) 24 (!) 26 20  Temp:   (!) 97.5 F (36.4 C) 97.9 F (36.6 C)  TempSrc:    Oral  SpO2:   95% 95%  Weight:      Height:         Physical Exam Vitals and nursing note reviewed.  HENT:     Head: Normocephalic.  Eyes:     Extraocular Movements: Extraocular movements intact.  Cardiovascular:     Rate and Rhythm: Normal rate. Rhythm irregular.     Heart sounds: Murmur heard.  Pulmonary:     Effort: Pulmonary effort is normal. No respiratory distress.     Breath sounds: Normal breath sounds. No wheezing.     Comments: Speaking in full sentences, no tripoding, no accessory muscle use Abdominal:     Palpations: Abdomen is soft.     Tenderness: There is no abdominal tenderness. There is no guarding.  Musculoskeletal:     Cervical back: Normal range of motion.     Right lower leg: Edema (minimal, non-pitting) present.     Left lower leg: Edema (minimal, non-pitting) present.     Comments: Moves all extremities spontaneously without difficulty  Skin:    General: Skin is warm and dry.  Neurological:     Mental Status: She is alert and oriented to person, place, and time.     (all labs ordered are listed, but only abnormal results are displayed) Labs Reviewed  CBC WITH DIFFERENTIAL/PLATELET - Abnormal; Notable for the following components:      Result Value   RBC 5.55 (*)    Hemoglobin 16.3 (*)    HCT 50.6 (*)    RDW 17.2 (*)     Platelets 108 (*)    All other components within normal limits  COMPREHENSIVE METABOLIC PANEL WITH GFR - Abnormal; Notable for the following components:   CO2 21 (*)    Glucose, Bld 116 (*)    BUN 27 (*)    Creatinine, Ser 1.33 (*)    Total Bilirubin 1.5 (*)    GFR, Estimated 41 (*)    All other components within normal limits  BRAIN NATRIURETIC PEPTIDE - Abnormal; Notable for the following components:   B Natriuretic Peptide 942.5 (*)  All other components within normal limits  TROPONIN I (HIGH SENSITIVITY) - Abnormal; Notable for the following components:   Troponin I (High Sensitivity) 50 (*)    All other components within normal limits  TROPONIN I (HIGH SENSITIVITY) - Abnormal; Notable for the following components:   Troponin I (High Sensitivity) 46 (*)    All other components within normal limits  LIPASE, BLOOD  I-STAT CG4 LACTIC ACID, ED    EKG: EKG Interpretation Date/Time:  Sunday March 31 2024 12:01:47 EDT Ventricular Rate:  72 PR Interval:    QRS Duration:  111 QT Interval:  410 QTC Calculation: 449 R Axis:   7  Text Interpretation: Atrial fibrillation Abnormal T, consider ischemia, lateral leads No significant change since last tracing Confirmed by Randol Simmonds [phone]) on 03/31/2024 12:14:53 PM  Radiology: [facility] Chest Portable 1 View Result Date: 03/31/2024 EXAM: 1 VIEW XRAY OF THE CHEST 03/31/2024 12:12:00 PM COMPARISON: 03/05/2024 CLINICAL HISTORY: SOB for about 3 days, also states has afib. FINDINGS: LUNGS AND PLEURA: No focal pulmonary opacity. No pulmonary edema. No pleural effusion. No pneumothorax. Unchanged scar within the left midlung adjacent to the left heart border. HEART AND MEDIASTINUM: Stable cardiac enlargement. Left chest wall single lead pacer with lead in the right ventricle. Status post median sternotomy and left atrial clipping. BONES AND SOFT TISSUES: No acute osseous abnormality. IMPRESSION: 1. No acute findings. 2. Stable cardiac enlargement with  left chest wall single lead pacer in the right ventricle, status post median sternotomy, and left atrial clipping. Electronically signed by: Waddell Calk MD 03/31/2024 12:27 PM EDT RP Workstation: [id number]     Procedures   Medications Ordered in the ED  ipratropium-albuterol  (DUONEB) 0.5-2.5 (3) MG/3ML nebulizer solution 3 mL (3 mLs Nebulization Given 03/31/24 1617)                                    Medical Decision Making This patient presents to the ED for concern of multiple complaints, this involves an extensive number of treatment options, and is a complaint that carries with it a high risk of complications and morbidity.  The differential diagnosis includes gastritis, peptic ulcer disease, acute mesenteric ischemia, chronic mesenteric ischemia, COPD exacerbation, CHF exacerbation   Co morbidities that complicate the patient evaluation  CHF, COPD, permanent atrial fibrillation on Xarelto    Additional history obtained:  Additional history obtained from record review External records from outside source obtained and reviewed including recent hospital discharge summary, recent cardiology note   Lab Tests:  I Ordered, and personally interpreted labs.  The pertinent results include:  CBC largely stable as compared to previous, with elevations in RBC/Hgb/HCT/RDW; thrombocytopenia with platelet count of 108 but largely stable from previous.  CMP largely stable as compared to previous.  Initial troponin of 50, however this is similar to patient's baseline with most recent troponin 3 weeks ago of 45; repeat troponin 46.  BNP elevated at 942.5, this is elevated as compared to previous from 13 days ago of 461.8.  Lactic acid within normal limits at 1.1.  Lipase within normal limits.   Imaging Studies ordered:  I ordered imaging studies including CXR  I independently visualized and interpreted imaging which showed 1. No acute findings. 2. Stable cardiac enlargement with left chest  wall single lead pacer in the right ventricle, status post median sternotomy, and left atrial clipping.  I agree with the radiologist interpretation   Cardiac  Monitoring: / EKG:  The patient was maintained on a cardiac monitor.  I personally viewed and interpreted the cardiac monitored which showed an underlying rhythm of: atrial fibrillation   Problem List / ED Course / Critical interventions / Medication management  I ordered medication including DuoNeb for shortness of breath Reevaluation of the patient after these medicines showed that the patient improved I have reviewed the patients home medicines and have made adjustments as needed   Social Determinants of Health:  Tobacco use, housing instability, financial strain   Test / Admission - Considered:  Physical exam is largely unremarkable as above.  Patient does not appear volume overloaded, she does have very minimal edema to the lower extremities but she tells me that this is similar to her baseline, with the right being more edematous than the left secondary to a previous injury.  She is oxygenating between 96-100% on room air while I am at the bedside, she is not demonstrating any signs of respiratory distress although she is tachypneic at times, but she is speaking in full sentences, not tripoding, no accessory muscle use.  When asked about her shortness of breath she tells me that this is similar to her baseline and no worse today, her lungs are clear to auscultation bilaterally, she does not have a worsening cough/sputum production that would make me concerned for COPD exacerbation and she is afebrile, chest x-ray is reassuring as above.  Will provide patient with DuoNeb treatment prior to discharge today.  Her abdominal exam is benign, patient notes dull abdominal pain after eating which may coincide with diagnosis of mesenteric ischemia, however given her normal abdominal exam I do not feel that imaging is warranted at this time.   I ordered a lactic acid to assess for intestinal ischemia, this was within normal limits.  Patient has a history of anaphylactic reaction to contrast dye.  Patient has seen [facility] gastroenterology for similar abdominal pain in the past, I provided her with their contact information to schedule follow-up as it has been many years since her last evaluation with them.  She is currently on Protonix , I will prescribe Carafate  in the event that her symptoms are secondary to gastritis/peptic ulcer disease.  She voiced understanding and is in agreement with this plan.  Return precautions discussed.  She is appropriate discharge at this time.   Staffed with Dr. Franklyn.  Amount and/or Complexity of Data Reviewed Labs: ordered. Radiology: ordered.  Risk Prescription drug management.        Final diagnoses:  Abdominal pain, unspecified abdominal location  Shortness of breath    ED Discharge Orders          Ordered    sucralfate  (CARAFATE ) 1 g tablet  3 times daily with meals & bedtime        03/31/24 1617               Glendia Rocky SAILOR, PA-C 03/31/24 1625    Franklyn Sid SAILOR, MD 03/31/24 2217

## 2024-03-31 NOTE — ED Triage Notes (Signed)
 Patient complains of sob x 3 days.  Hx of COPD CHF CAD and cardiomyopathy.  Denies chest pain at this time.

## 2024-03-31 NOTE — Discharge Instructions (Addendum)
 Follow-up with your primary care provider.  I have provided you with the contact information for [facility] gastroenterology, please schedule follow-up with them in regard to your abdominal pain.  Start Carafate , take 1 tablet with meals and at bedtime.  Return to the emergency department if your symptoms worsen.

## 2024-04-05 ENCOUNTER — Telehealth: Payer: Self-pay

## 2024-04-05 NOTE — Telephone Encounter (Signed)
 Patient just discharged from hospital, has f/u with Dr. Cherrie on 9/11. She is also due for annual follow up with Dr. Waddell, please set up for next available. Thanks.

## 2024-04-07 ENCOUNTER — Ambulatory Visit (HOSPITAL_COMMUNITY)
Admission: EM | Admit: 2024-04-07 | Discharge: 2024-04-07 | Disposition: A | Discharge status: Home / Self Care | Attending: Emergency Medicine | Admitting: Emergency Medicine

## 2024-04-07 ENCOUNTER — Observation Stay (HOSPITAL_COMMUNITY)
Admission: EM | Admit: 2024-04-07 | Discharge: 2024-04-08 | Disposition: A | Discharge status: Home / Self Care | Attending: Internal Medicine | Admitting: Internal Medicine

## 2024-04-07 ENCOUNTER — Other Ambulatory Visit: Payer: Self-pay

## 2024-04-07 ENCOUNTER — Encounter (HOSPITAL_COMMUNITY): Payer: Self-pay

## 2024-04-07 ENCOUNTER — Emergency Department (HOSPITAL_COMMUNITY)

## 2024-04-07 DIAGNOSIS — E1122 Type 2 diabetes mellitus with diabetic chronic kidney disease: Secondary | ICD-10-CM | POA: Insufficient documentation

## 2024-04-07 DIAGNOSIS — Z950 Presence of cardiac pacemaker: Secondary | ICD-10-CM | POA: Diagnosis not present

## 2024-04-07 DIAGNOSIS — R0682 Tachypnea, not elsewhere classified: Secondary | ICD-10-CM | POA: Insufficient documentation

## 2024-04-07 DIAGNOSIS — F17210 Nicotine dependence, cigarettes, uncomplicated: Secondary | ICD-10-CM | POA: Insufficient documentation

## 2024-04-07 DIAGNOSIS — I2510 Atherosclerotic heart disease of native coronary artery without angina pectoris: Secondary | ICD-10-CM | POA: Diagnosis present

## 2024-04-07 DIAGNOSIS — I130 Hypertensive heart and chronic kidney disease with heart failure and stage 1 through stage 4 chronic kidney disease, or unspecified chronic kidney disease: Secondary | ICD-10-CM | POA: Diagnosis not present

## 2024-04-07 DIAGNOSIS — Z951 Presence of aortocoronary bypass graft: Secondary | ICD-10-CM | POA: Diagnosis not present

## 2024-04-07 DIAGNOSIS — I340 Nonrheumatic mitral (valve) insufficiency: Secondary | ICD-10-CM | POA: Insufficient documentation

## 2024-04-07 DIAGNOSIS — R911 Solitary pulmonary nodule: Secondary | ICD-10-CM | POA: Diagnosis present

## 2024-04-07 DIAGNOSIS — Z7901 Long term (current) use of anticoagulants: Secondary | ICD-10-CM | POA: Diagnosis not present

## 2024-04-07 DIAGNOSIS — N183 Chronic kidney disease, stage 3 unspecified: Secondary | ICD-10-CM | POA: Diagnosis present

## 2024-04-07 DIAGNOSIS — K5730 Diverticulosis of large intestine without perforation or abscess without bleeding: Secondary | ICD-10-CM | POA: Diagnosis not present

## 2024-04-07 DIAGNOSIS — Z79899 Other long term (current) drug therapy: Secondary | ICD-10-CM | POA: Diagnosis not present

## 2024-04-07 DIAGNOSIS — J8410 Pulmonary fibrosis, unspecified: Secondary | ICD-10-CM | POA: Diagnosis not present

## 2024-04-07 DIAGNOSIS — I509 Heart failure, unspecified: Secondary | ICD-10-CM

## 2024-04-07 DIAGNOSIS — I4821 Permanent atrial fibrillation: Secondary | ICD-10-CM | POA: Diagnosis not present

## 2024-04-07 DIAGNOSIS — R9431 Abnormal electrocardiogram [ECG] [EKG]: Secondary | ICD-10-CM | POA: Diagnosis not present

## 2024-04-07 DIAGNOSIS — R059 Cough, unspecified: Secondary | ICD-10-CM | POA: Insufficient documentation

## 2024-04-07 DIAGNOSIS — N1832 Chronic kidney disease, stage 3b: Secondary | ICD-10-CM | POA: Diagnosis not present

## 2024-04-07 DIAGNOSIS — R0602 Shortness of breath: Secondary | ICD-10-CM | POA: Diagnosis not present

## 2024-04-07 DIAGNOSIS — J189 Pneumonia, unspecified organism: Principal | ICD-10-CM | POA: Insufficient documentation

## 2024-04-07 DIAGNOSIS — I2583 Coronary atherosclerosis due to lipid rich plaque: Secondary | ICD-10-CM

## 2024-04-07 DIAGNOSIS — I4819 Other persistent atrial fibrillation: Secondary | ICD-10-CM | POA: Diagnosis present

## 2024-04-07 DIAGNOSIS — I5042 Chronic combined systolic (congestive) and diastolic (congestive) heart failure: Secondary | ICD-10-CM | POA: Diagnosis not present

## 2024-04-07 DIAGNOSIS — R10819 Abdominal tenderness, unspecified site: Secondary | ICD-10-CM | POA: Insufficient documentation

## 2024-04-07 DIAGNOSIS — J441 Chronic obstructive pulmonary disease with (acute) exacerbation: Secondary | ICD-10-CM | POA: Diagnosis not present

## 2024-04-07 DIAGNOSIS — J45909 Unspecified asthma, uncomplicated: Secondary | ICD-10-CM | POA: Insufficient documentation

## 2024-04-07 DIAGNOSIS — J449 Chronic obstructive pulmonary disease, unspecified: Secondary | ICD-10-CM | POA: Diagnosis not present

## 2024-04-07 DIAGNOSIS — I10 Essential (primary) hypertension: Secondary | ICD-10-CM | POA: Diagnosis not present

## 2024-04-07 DIAGNOSIS — I5022 Chronic systolic (congestive) heart failure: Secondary | ICD-10-CM

## 2024-04-07 DIAGNOSIS — R079 Chest pain, unspecified: Secondary | ICD-10-CM | POA: Diagnosis not present

## 2024-04-07 DIAGNOSIS — I517 Cardiomegaly: Secondary | ICD-10-CM | POA: Diagnosis not present

## 2024-04-07 DIAGNOSIS — R0789 Other chest pain: Secondary | ICD-10-CM | POA: Diagnosis not present

## 2024-04-07 DIAGNOSIS — J9811 Atelectasis: Secondary | ICD-10-CM | POA: Diagnosis not present

## 2024-04-07 LAB — HEPATIC FUNCTION PANEL
ALT: 13 U/L (ref 0–44)
AST: 21 U/L (ref 15–41)
Albumin: 3.1 g/dL — ABNORMAL LOW (ref 3.5–5.0)
Alkaline Phosphatase: 90 U/L (ref 38–126)
Bilirubin, Direct: 0.2 mg/dL (ref 0.0–0.2)
Indirect Bilirubin: 0.7 mg/dL (ref 0.3–0.9)
Total Bilirubin: 0.9 mg/dL (ref 0.0–1.2)
Total Protein: 6.5 g/dL (ref 6.5–8.1)

## 2024-04-07 LAB — BASIC METABOLIC PANEL WITH GFR
Anion gap: 10 (ref 5–15)
BUN: 27 mg/dL — ABNORMAL HIGH (ref 8–23)
CO2: 18 mmol/L — ABNORMAL LOW (ref 22–32)
Calcium: 9.5 mg/dL (ref 8.9–10.3)
Chloride: 113 mmol/L — ABNORMAL HIGH (ref 98–111)
Creatinine, Ser: 1.32 mg/dL — ABNORMAL HIGH (ref 0.44–1.00)
GFR, Estimated: 41 mL/min — ABNORMAL LOW (ref >60)
Glucose, Bld: 115 mg/dL — ABNORMAL HIGH (ref 70–99)
Potassium: 3.9 mmol/L (ref 3.5–5.1)
Sodium: 141 mmol/L (ref 135–145)

## 2024-04-07 LAB — CBC
HCT: 49.2 % — ABNORMAL HIGH (ref 36.0–46.0)
Hemoglobin: 16 g/dL — ABNORMAL HIGH (ref 12.0–15.0)
MCH: 29.9 pg (ref 26.0–34.0)
MCHC: 32.5 g/dL (ref 30.0–36.0)
MCV: 91.8 fL (ref 80.0–100.0)
Platelets: 102 K/uL — ABNORMAL LOW (ref 150–400)
RBC: 5.36 MIL/uL — ABNORMAL HIGH (ref 3.87–5.11)
RDW: 17.2 % — ABNORMAL HIGH (ref 11.5–15.5)
WBC: 5.5 K/uL (ref 4.0–10.5)
nRBC: 0 % (ref 0.0–0.2)

## 2024-04-07 LAB — LIPASE, BLOOD: Lipase: 30 U/L (ref 11–51)

## 2024-04-07 LAB — RESP PANEL BY RT-PCR (RSV, FLU A&B, COVID)  RVPGX2
Influenza A by PCR: NEGATIVE
Influenza B by PCR: NEGATIVE
Resp Syncytial Virus by PCR: NEGATIVE
SARS Coronavirus 2 by RT PCR: NEGATIVE

## 2024-04-07 LAB — TROPONIN I (HIGH SENSITIVITY)
Troponin I (High Sensitivity): 43 ng/L — ABNORMAL HIGH (ref <18)
Troponin I (High Sensitivity): 45 ng/L — ABNORMAL HIGH (ref <18)

## 2024-04-07 LAB — BRAIN NATRIURETIC PEPTIDE: B Natriuretic Peptide: 761.4 pg/mL — ABNORMAL HIGH (ref 0.0–100.0)

## 2024-04-07 MED ORDER — SODIUM CHLORIDE 0.9 % IV SOLN
500.0000 mg | Freq: Once | INTRAVENOUS | Status: AC
  Administered 2024-04-08: 500 mg via INTRAVENOUS
  Filled 2024-04-07: qty 5

## 2024-04-07 MED ORDER — SODIUM CHLORIDE 0.9 % IV SOLN
1.0000 g | Freq: Once | INTRAVENOUS | Status: AC
  Administered 2024-04-08: 1 g via INTRAVENOUS
  Filled 2024-04-07: qty 10

## 2024-04-07 NOTE — ED Provider Triage Note (Signed)
 Emergency Medicine Provider Triage Evaluation Note  [name] [name] , a 78 y.o. female  was evaluated in triage.  Pt complains of shortness of breath, chest pain intermittently for 2 days.  Went to urgent care earlier today and they sent her to the ED for further evaluation for abnormal EKG.  She has a pacemaker in place.  History of persistent A-fib, previous CABG, CKD.  Review of Systems  Positive: Chest pain, shortness of breath Negative:   Physical Exam  BP (!) 133/90 (BP Location: Right Arm)   Pulse (!) 53   Temp 98.5 F (36.9 C)   Resp 16   Ht 5' 7 (1.702 m)   Wt 74.8 kg   SpO2 98%   BMI 25.84 kg/m  Gen:   Awake, no distress   Resp:  Normal effort  MSK:   Moves extremities without difficulty  Other:  Irregularly irregular heart rhythm, normal breath sounds bilaterally.  No respiratory distress.  Medical Decision Making  Medically screening exam initiated at 7:35 PM.  Appropriate orders placed.  BRIANTE LOVEALL was informed that the remainder of the evaluation will be completed by another provider, this initial triage assessment does not replace that evaluation, and the importance of remaining in the ED until their evaluation is complete.  Workup initiated in triage    Rosan Sherlean DEL, [place] 04/07/24 [id number]

## 2024-04-07 NOTE — ED Notes (Signed)
 Unsuccessful attempts IV access x2 in R FA.

## 2024-04-07 NOTE — ED Provider Notes (Signed)
 [facility]    CSN: [id number] Arrival date & time: 04/07/24  1419    HISTORY   Chief Complaint  Patient presents with   Nasal Congestion   HPI [name] [name] is a pleasant, 78 y.o. female who presents to urgent care today. Patient states that she has been short of breath for the past 2 days more so than usual, states she is chronically short of breath due to a history of COPD, CHF and cardiomyopathy.  Patient also complains of nasal congestion.  On arrival, patient is able to speak in complete sentences but appears to get mildly winded when doing so.  Patient denies chest pain at this time.  Patient reports compliance with all of her medications.    The history is provided by the patient.   Past Medical History:  Diagnosis Date   (HFimpEF) heart failure with improved ejection fraction (HCC)    a. 07/2018 Echo: EF 30-35%; b. 11/2019 Echo: EF 55-60%, no rwma, Gr2 DD, Nl RV size/fxn. Mild BAE. Mild MR/AI.   Arthritis    Asthma    Atopic dermatitis    CAD (coronary artery disease)    a. 2016 s/p CABG x 2 (LIMA->LAD, VG->OM); b. 08/2018 MV: EF 44%, no ischemia/infact.   Cardiac arrest (HCC) 10/2014   Cardiomyopathy, ischemic    a. 07/2018 Echo: EF 30-35%; 11/2019 Echo: EF 55-60%.   Carotid arterial disease (HCC)    a. 11/2019 Carotid U/S   CHF (congestive heart failure) (HCC)    CKD (chronic kidney disease), stage III (HCC)    COPD (chronic obstructive pulmonary disease) (HCC)    Diabetes mellitus without complication (HCC)    Dysrhythmia    Esophageal dilatation 2013   GERD (gastroesophageal reflux disease)    Glaucoma    Gout    Headache    Hearing loss    History of blood transfusion    Hypertension    Hypokalemia    Idiopathic angioedema    LGI bleed 08/06/2017   a. felt to be hemorrhoidal during that admission (no drop in Hgb).   Lower back pain    Paroxysmal atrial fibrillation (HCC)    a. Dx 2016-->h/o difficult to control rates (complicated by  noncompliance), not felt to be a candidate for ablation or antiarrhythmic due to noncompliance; b. Recurrent AF 2021 - converted w/ IV dilt; c. CHA2DS2VASc = 7-->Xarelto .   Personal history of noncompliance with medical treatment, presenting hazards to health    Pneumonia    Prediabetes    Presence of permanent cardiac pacemaker    Right leg numbness    S/P CABG x 2 with clipping of LA appendage 11/14/2014   LIMA to LAD, SVG to OM, EVH via right thigh   Urine incontinence    Uterine fibroid    Patient Active Problem List   Diagnosis Date Noted   Acute on chronic combined systolic and diastolic CHF (congestive heart failure) (HCC) 03/06/2024   Acute on chronic heart failure (HCC) 03/06/2024   Dyslipidemia, goal LDL below 70 02/01/2024   Acute non-recurrent pansinusitis 02/01/2024   Encounter for general adult medical examination with abnormal findings 02/01/2024   Type 2 diabetes mellitus with hyperlipidemia (HCC) 12/14/2022   Chronic combined systolic and diastolic CHF (congestive heart failure) (HCC) 06/17/2022   Chronic anticoagulation 06/06/2022   Mitral regurgitation 06/06/2022   Controlled type 2 diabetes mellitus without complication, without long-term current use of insulin  (HCC) 06/06/2022   Pacemaker 02/22/2022   Obstructive sleep apnea  09/21/2020   Obesity (BMI 30.0-34.9) 02/10/2020   COPD (chronic obstructive pulmonary disease) (HCC) 10/26/2019   Thrombocytopenia (HCC) 09/14/2017   Medication noncompliance due to cognitive impairment 09/06/2017   Persistent atrial fibrillation (HCC) 08/04/2017   Cardiomyopathy, ischemic 02/08/2017   CKD (chronic kidney disease), stage III (HCC) 01/30/2017   Coronary artery disease due to lipid rich plaque    Essential hypertension    Past Surgical History:  Procedure Laterality Date   AV NODE ABLATION N/A 01/18/2021   Procedure: AV NODE ABLATION;  Surgeon: Waddell Danelle ORN, MD;  [facility];  Service: Cardiovascular;   Laterality: N/A;   BALLOON DILATION N/A 10/10/2019   Procedure: BALLOON DILATION;  Surgeon: Elicia Claw, MD;  [facility]: [facility];  Service: Gastroenterology;  Laterality: N/A;   BIOPSY  10/10/2019   Procedure: BIOPSY;  Surgeon: Elicia Claw, MD;  [facility];  Service: Gastroenterology;;   BIOPSY  10/11/2019   Procedure: BIOPSY;  Surgeon: Elicia Claw, MD;  [facility];  Service: Gastroenterology;;   JUVENTINO LLANO N/A 11/21/2022   Procedure: BIVI  ICD UPGRADE;  Surgeon: Waddell Danelle ORN, MD;  Location: [facility] INVASIVE CV LAB;  Service: Cardiovascular;  Laterality: N/A;   CARDIOVERSION N/A 11/18/2014   Procedure: CARDIOVERSION;  Surgeon: Vinie JAYSON Maxcy, MD;  Location: [facility] OR;  Service: Cardiovascular;  Laterality: N/A;   CARDIOVERSION N/A 12/25/2017   Procedure: CARDIOVERSION;  Surgeon: Cherrie Toribio SAUNDERS, MD;  Location: [facility] ENDOSCOPY;  Service: Cardiovascular;  Laterality: N/A;   CLIPPING OF ATRIAL APPENDAGE N/A 11/14/2014   Procedure: CLIPPING OF ATRIAL APPENDAGE;  Surgeon: Sudie VEAR Laine, MD;  [facility];  Service: Open Heart Surgery;  Laterality: N/A;   COLONOSCOPY  2013   COLONOSCOPY WITH PROPOFOL  N/A 10/11/2019   Procedure: COLONOSCOPY WITH PROPOFOL ;  Surgeon: Elicia Claw, MD;  [facility];  Service: Gastroenterology;  Laterality: N/A;   CORONARY ARTERY BYPASS GRAFT N/A 11/14/2014   Procedure: CORONARY ARTERY BYPASS GRAFTING (CABG)TIMES 2 USING LEFT INTERNAL MAMMARY ARTERY AND RIGHT SAPHENOUS VEIN HARVESTED ENDOSCOPICALLY;  Surgeon: Sudie VEAR Laine, MD;  [facility];  Service: Open Heart Surgery;  Laterality: N/A;   ESOPHAGOGASTRODUODENOSCOPY (EGD) WITH PROPOFOL  N/A 10/10/2019   Procedure: ESOPHAGOGASTRODUODENOSCOPY (EGD) WITH PROPOFOL ;  Surgeon: Elicia Claw, MD;  [facility];  Service: Gastroenterology;  Laterality: N/A;   ESOPHAGOGASTRODUODENOSCOPY (EGD) WITH PROPOFOL  N/A 10/27/2019   Procedure:  ESOPHAGOGASTRODUODENOSCOPY (EGD) WITH PROPOFOL ;  Surgeon: Saintclair Jasper, MD;  Location: [facility] ENDOSCOPY;  Service: Gastroenterology;  Laterality: N/A;   GIVENS CAPSULE STUDY N/A 10/27/2019   Procedure: GIVENS CAPSULE STUDY;  Surgeon: Saintclair Jasper, MD;  Location: [facility] ENDOSCOPY;  Service: Gastroenterology;  Laterality: N/A;   INSERT / REPLACE / REMOVE PACEMAKER     LEFT HEART CATHETERIZATION WITH CORONARY ANGIOGRAM N/A 11/04/2014   Procedure: LEFT HEART CATHETERIZATION WITH CORONARY ANGIOGRAM;  Surgeon: Debby DELENA Sor, MD;  Location: [facility] CATH LAB;  Service: Cardiovascular;  Laterality: N/A;   ORIF ANKLE FRACTURE Right 06/26/2023   Procedure: OPEN REDUCTION INTERNAL FIXATION (ORIF) BIMALLEOLAR ANKLE FRACTURE;  Surgeon: Barton Drape, MD;  Location: [facility] ORS;  Service: Orthopedics;  Laterality: Right;   PACEMAKER IMPLANT N/A 01/18/2021   Procedure: PACEMAKER IMPLANT;  Surgeon: Waddell Danelle ORN, MD;  [facility];  Service: Cardiovascular;  Laterality: N/A;   PACEMAKER IMPLANT Left    POLYPECTOMY  10/11/2019   Procedure: POLYPECTOMY;  Surgeon: Elicia Claw, MD;  [facility];  Service: Gastroenterology;;   RIGHT HEART CATH N/A  06/08/2022   Procedure: RIGHT HEART CATH;  Surgeon: Cherrie Toribio SAUNDERS, MD;  Location: [facility] INVASIVE CV LAB;  Service: Cardiovascular;  Laterality: N/A;   TEE WITHOUT CARDIOVERSION N/A 11/14/2014   Procedure: TRANSESOPHAGEAL ECHOCARDIOGRAM (TEE);  Surgeon: Sudie VEAR Laine, MD;  Location: [facility] OR;  Service: Open Heart Surgery;  Laterality: N/A;   TEE WITHOUT CARDIOVERSION N/A 12/25/2017   Procedure: TRANSESOPHAGEAL ECHOCARDIOGRAM (TEE);  Surgeon: Cherrie Toribio SAUNDERS, MD;  Location: [facility] ENDOSCOPY;  Service: Cardiovascular;  Laterality: N/A;   TEMPORARY PACEMAKER INSERTION  11/04/2014   Procedure: TEMPORARY PACEMAKER INSERTION;  Surgeon: Debby DELENA Sor, MD;  Location: [facility] CATH LAB;  Service: Cardiovascular;;   TOOTH EXTRACTION     OB History   No obstetric  history on file.    Home Medications    Prior to Admission medications   Medication Sig Start Date End Date Taking? Authorizing Provider  acetaminophen  (TYLENOL ) 500 MG tablet Take 500 mg by mouth as needed for mild pain (pain score 1-3) or moderate pain (pain score 4-6).    [provider]  Albuterol -Budesonide  (AIRSUPRA ) 90-80 MCG/ACT AERO Inhale 2 puffs into the lungs every 4 (four) hours as needed. 01/10/24   Sagardia, Miguel Jose, MD  Alcohol  Swabs  (ALCOHOL  PADS) 70 % PADS 1 Act by Does not apply route 2 (two) times daily. 04/28/23   Joshua Debby CROME, MD  allopurinol  (ZYLOPRIM ) 100 MG tablet Take 1 tablet (100 mg total) by mouth daily. NEEDS FOLLOW UP APPOINTMENT FOR MORE REFILLS 11/28/23   Bensimhon, Toribio SAUNDERS, MD  Aromatic Inhalants (VICKS VAPOR INHALER IN) Inhale into the lungs as needed.    [provider]  atorvastatin  (LIPITOR ) 10 MG tablet TAKE 1 TABLET BY MOUTH EVERYDAY AT BEDTIME 07/22/23   Joshua Debby CROME, MD  Blood Glucose Monitoring Suppl (ONE TOUCH ULTRA 2) w/Device KIT 1 Act by Does not apply route 2 (two) times daily. 04/28/23   Joshua Debby CROME, MD  gabapentin  (NEURONTIN ) 100 MG capsule TAKE 1 CAPSULE BY MOUTH EVERYDAY AT BEDTIME 03/22/24   Gershon Donnice SAUNDERS, DPM  hydrALAZINE  (APRESOLINE ) 25 MG tablet Take 3 tablets (75 mg total) by mouth 3 (three) times daily. 03/18/24   Lee, [place], NP  losartan  (COZAAR ) 100 MG tablet Take 1 tablet (100 mg total) by mouth daily. 12/22/23   Alvia Corean CROME, FNP  ondansetron  (ZOFRAN ) 4 MG tablet Take 1 tablet (4 mg total) by mouth every 8 (eight) hours as needed for nausea or vomiting. 03/06/24   Danford, Lonni SQUIBB, MD  [facility] ULTRA test strip 1 each by Other route 2 (two) times daily. as directed 04/28/23   Joshua Debby CROME, MD  pantoprazole  (PROTONIX ) 40 MG tablet TAKE 1 TABLET BY MOUTH EVERY DAY 01/23/24   Joshua Debby CROME, MD  spironolactone  (ALDACTONE ) 25 MG tablet TAKE 1 TABLET (25 MG TOTAL) BY MOUTH DAILY. 12/19/23    Bensimhon, Toribio SAUNDERS, MD  sucralfate  (CARAFATE ) 1 g tablet Take 1 tablet (1 g total) by mouth 4 (four) times daily -  with meals and at bedtime. 03/31/24   Scott, Rocky SAILOR, PA-C  torsemide  (DEMADEX ) 20 MG tablet TAKE 1.5 TABLETS BY MOUTH EVERY DAY Patient taking differently: Take 20 mg by mouth daily. 11/28/23   Waddell Danelle ORN, MD  TRELEGY ELLIPTA  100-62.5-25 MCG/ACT AEPB INHALE 1 PUFF INTO THE LUNGS DAILY 01/24/24   Joshua Debby CROME, MD  XARELTO  15 MG TABS tablet TAKE 1 TABLET (15 MG TOTAL) BY MOUTH DAILY WITH SUPPER 01/09/24   Bensimhon, Toribio  R, MD  losartan  (COZAAR ) 50 MG tablet Take 1 tablet (50 mg total) by mouth daily. 12/16/22   Bensimhon, Toribio SAUNDERS, MD    Family History Family History  Problem Relation Age of Onset   Cancer Mother        LYMPHOMA   Heart disease Father    Other Father        TB   CVA Sister    Prostate cancer Brother 42   Diabetes Brother    Social History Social History   Tobacco Use   Smoking status: Some Days    Current packs/day: 0.00    Average packs/day: 0.5 packs/day for 56.0 years (28.0 ttl pk-yrs)    Types: Cigarettes    Start date: 11/13/1965    Last attempt to quit: 11/13/2021    Years since quitting: 2.4   Smokeless tobacco: Never   Tobacco comments:    01/02/2024 Patient smokes about 7-8 cigarettes daily    smoking 2 cigarettes/day as of 10/23/20  Vaping Use   Vaping status: Never Used  Substance Use Topics   Alcohol  use: No    Alcohol /week: 0.0 standard drinks of alcohol    Drug use: No   Allergies   Bee venom, Ivp dye [iodinated contrast media], Ace inhibitors, Atenolol, Codeine, Entresto  [sacubitril -valsartan ], Isosorbide , Shrimp [shellfish allergy], Simvastatin, Jardiance [empagliflozin], and Penicillin g  Review of Systems Review of Systems Pertinent findings revealed after performing a 14 point review of systems has been noted in the history of present illness.  Physical Exam Vital Signs BP (!) 144/95   Pulse 67   Temp 97.6 F (36.4  C) (Oral)   Resp 18   SpO2 100%   No data found.  Physical Exam Vitals and nursing note reviewed.  Constitutional:      General: She is awake. She is not in acute distress.    Appearance: Normal appearance. She is well-developed and well-groomed. She is ill-appearing.  Cardiovascular:     Rate and Rhythm: Normal rate. Rhythm irregularly irregular.     Pulses: Normal pulses.     Heart sounds: Heart sounds are distant.     Friction rub present. No gallop.  Pulmonary:     Effort: Pulmonary effort is normal.     Breath sounds: Normal breath sounds and air entry.  Musculoskeletal:     Right lower leg: 1+ Pitting Edema present.     Left lower leg: 1+ Pitting Edema present.  Skin:    General: Skin is warm and dry.  Neurological:     Mental Status: She is alert.  Psychiatric:        Attention and Perception: Attention and perception normal.        Mood and Affect: Mood and affect normal.        Speech: Speech normal.        Behavior: Behavior normal. Behavior is cooperative.        Thought Content: Thought content normal.     Visual Acuity Right Eye Distance:   Left Eye Distance:   Bilateral Distance:    Right Eye Near:   Left Eye Near:    Bilateral Near:     UC Couse / Diagnostics / Procedures:     Radiology No results found.  Procedures Procedures (including critical care time) EKG  Pending results:  Labs Reviewed - No data to display  Medications Ordered in UC: Medications - No data to display  UC Diagnoses / Final Clinical Impressions(s)   I have reviewed  the triage vital signs and the nursing notes.  Pertinent labs & imaging results that were available during my care of the patient were reviewed by me and considered in my medical decision making (see chart for details).    Final diagnoses:  SOB (shortness of breath)  Chronic congestive heart failure, unspecified heart failure type (HCC)  Nonspecific abnormal electrocardiogram (ECG) (EKG)   EKG  compared with previous revealed questionable new ST elevation in leads II and III with mild depression appreciated in lead V5.  LVH, PVCs also appreciated which is not new.  Patient's echocardiogram performed in June of this year reveals ejection fraction of 25% with decreased septal wall motion.  Patient was seen at the ED a week ago complaining of shortness of breath.  Troponins were slightly more elevated than previous, but apparently not high enough to warrant further cardiac workup.  BNP was more elevated but this is not a test that is useful for serial evaluation.  I shared this information with the patient and advised her that because she is experiencing shortness of breath at this time which is not improved over the past week and seems to be getting worse, I recommend that she go to the emergency room now for further evaluation.  Patient verbalized that she needed to use the bathroom and get something to eat before she did so.  Patient also asked me if it would be okay if she went in the morning.  I specifically advised patient as well as a member of her household who is with her today that I recommend she go to the emergency room now to make sure she is not having worsening ischemia and her heart muscle which this could be fatal for her.  Both patient and member of her household verbalized understanding of this but insisted upon going home to at the very least get her something to eat out or use the bathroom.  Patient and household member strongly encouraged not to drive to the emergency room but instead to call 911 so that EMS could come to them.  Please see discharge instructions below for details of plan of care as provided to patient. ED Prescriptions   None    PDMP not reviewed this encounter.  Pending results:  Labs Reviewed - No data to display  Discharge Instructions   None     Disposition Upon Discharge:  Condition: stable for discharge home  Patient presented with an acute  illness with associated systemic symptoms and significant discomfort requiring urgent management. In my opinion, this is a condition that a prudent lay person (someone who possesses an average knowledge of health and medicine) may potentially expect to result in complications if not addressed urgently such as respiratory distress, impairment of bodily function or dysfunction of bodily organs.   Routine symptom specific, illness specific and/or disease specific instructions were discussed with the patient and/or caregiver at length.   As such, the patient has been evaluated and assessed, work-up was performed and treatment was provided in alignment with urgent care protocols and evidence based medicine.  Patient/parent/caregiver has been advised that the patient may require follow up for further testing and treatment if the symptoms continue in spite of treatment, as clinically indicated and appropriate.  Patient/parent/caregiver has been advised to return to the [facility] or PCP if no better; to PCP or the Emergency Department if new signs and symptoms develop, or if the current signs or symptoms continue to change or worsen for further workup, evaluation  and treatment as clinically indicated and appropriate  The patient will follow up with their current PCP if and as advised. If the patient does not currently have a PCP we will assist them in obtaining one.   The patient may need specialty follow up if the symptoms continue, in spite of conservative treatment and management, for further workup, evaluation, consultation and treatment as clinically indicated and appropriate.  Patient/parent/caregiver verbalized understanding and agreement of plan as discussed.  All questions were addressed during visit.  Please see discharge instructions below for further details of plan.  This office note has been dictated using Teaching laboratory technician.  Unfortunately, this method of dictation can sometimes lead  to typographical or grammatical errors.  I apologize for your inconvenience in advance if this occurs.  Please do not hesitate to reach out to me if clarification is needed.      Joesph Shaver Scales, PA-C 04/07/24 1719

## 2024-04-07 NOTE — ED Provider Notes (Signed)
 [place] EMERGENCY DEPARTMENT AT [facility] Provider Note   CSN: [id number] Arrival date & time: 04/07/24  1901     Patient presents with: Chest Pain   OLETTA BUEHRING is a 78 y.o. female.   The history is provided by the patient and medical records. No language interpreter was used.  Chest Pain Pain location:  Substernal area Pain quality: aching   Pain radiates to:  Epigastrium Pain severity:  Moderate Onset quality:  Gradual Duration:  2 days Timing:  Constant Progression:  Waxing and waning Chronicity:  New Relieved by:  Nothing Worsened by:  Coughing Ineffective treatments:  None tried Associated symptoms: abdominal pain, cough, fatigue, nausea and shortness of breath   Associated symptoms: no altered mental status, no back pain, no diaphoresis, no fever, no headache, no lower extremity edema, no numbness, no palpitations and no vomiting        Prior to Admission medications   Medication Sig Start Date End Date Taking? Authorizing Provider  acetaminophen  (TYLENOL ) 500 MG tablet Take 500 mg by mouth as needed for mild pain (pain score 1-3) or moderate pain (pain score 4-6).    [provider]  Albuterol -Budesonide  (AIRSUPRA ) 90-80 MCG/ACT AERO Inhale 2 puffs into the lungs every 4 (four) hours as needed. 01/10/24   Sagardia, Miguel Jose, MD  Alcohol  Swabs  (ALCOHOL  PADS) 70 % PADS 1 Act by Does not apply route 2 (two) times daily. 04/28/23   Joshua Debby CROME, MD  allopurinol  (ZYLOPRIM ) 100 MG tablet Take 1 tablet (100 mg total) by mouth daily. NEEDS FOLLOW UP APPOINTMENT FOR MORE REFILLS 11/28/23   Bensimhon, Toribio SAUNDERS, MD  Aromatic Inhalants (VICKS VAPOR INHALER IN) Inhale into the lungs as needed.    [provider]  atorvastatin  (LIPITOR ) 10 MG tablet TAKE 1 TABLET BY MOUTH EVERYDAY AT BEDTIME 07/22/23   Joshua Debby CROME, MD  Blood Glucose Monitoring Suppl (ONE TOUCH ULTRA 2) w/Device KIT 1 Act by Does not apply route 2 (two) times daily. 04/28/23    Joshua Debby CROME, MD  gabapentin  (NEURONTIN ) 100 MG capsule TAKE 1 CAPSULE BY MOUTH EVERYDAY AT BEDTIME 03/22/24   Gershon Donnice SAUNDERS, DPM  hydrALAZINE  (APRESOLINE ) 25 MG tablet Take 3 tablets (75 mg total) by mouth 3 (three) times daily. 03/18/24   Lee, [place], NP  losartan  (COZAAR ) 100 MG tablet Take 1 tablet (100 mg total) by mouth daily. 12/22/23   Alvia Corean CROME, FNP  ondansetron  (ZOFRAN ) 4 MG tablet Take 1 tablet (4 mg total) by mouth every 8 (eight) hours as needed for nausea or vomiting. 03/06/24   Danford, Lonni SQUIBB, MD  [facility] ULTRA test strip 1 each by Other route 2 (two) times daily. as directed 04/28/23   Joshua Debby CROME, MD  pantoprazole  (PROTONIX ) 40 MG tablet TAKE 1 TABLET BY MOUTH EVERY DAY 01/23/24   Joshua Debby CROME, MD  spironolactone  (ALDACTONE ) 25 MG tablet TAKE 1 TABLET (25 MG TOTAL) BY MOUTH DAILY. 12/19/23   Bensimhon, Toribio SAUNDERS, MD  sucralfate  (CARAFATE ) 1 g tablet Take 1 tablet (1 g total) by mouth 4 (four) times daily -  with meals and at bedtime. 03/31/24   Scott, Rocky SAILOR, PA-C  torsemide  (DEMADEX ) 20 MG tablet TAKE 1.5 TABLETS BY MOUTH EVERY DAY Patient taking differently: Take 20 mg by mouth daily. 11/28/23   Waddell Danelle ORN, MD  TRELEGY ELLIPTA  100-62.5-25 MCG/ACT AEPB INHALE 1 PUFF INTO THE LUNGS DAILY 01/24/24   Joshua Debby CROME, MD  XARELTO  15  MG TABS tablet TAKE 1 TABLET (15 MG TOTAL) BY MOUTH DAILY WITH SUPPER 01/09/24   Bensimhon, Toribio SAUNDERS, MD  losartan  (COZAAR ) 50 MG tablet Take 1 tablet (50 mg total) by mouth daily. 12/16/22   Bensimhon, Toribio SAUNDERS, MD    Allergies: Bee venom, Ivp dye [iodinated contrast media], Ace inhibitors, Atenolol, Codeine, Entresto  [sacubitril -valsartan ], Isosorbide , Shrimp [shellfish allergy], Simvastatin, Jardiance [empagliflozin], and Penicillin g    Review of Systems  Constitutional:  Positive for chills and fatigue. Negative for diaphoresis and fever.  Respiratory:  Positive for cough, chest tightness and shortness of breath. Negative for  wheezing.   Cardiovascular:  Positive for chest pain. Negative for palpitations and leg swelling.  Gastrointestinal:  Positive for abdominal pain, diarrhea and nausea. Negative for constipation and vomiting.  Genitourinary:  Negative for dysuria.  Musculoskeletal:  Negative for back pain, neck pain and neck stiffness.  Skin:  Negative for rash and wound.  Neurological:  Negative for light-headedness, numbness and headaches.  Psychiatric/Behavioral:  Negative for agitation.   All other systems reviewed and are negative.   Updated Vital Signs BP (!) 133/90 (BP Location: Right Arm)   Pulse (!) 53   Temp 98.5 F (36.9 C)   Resp 16   Ht 5' 7 (1.702 m)   Wt 74.8 kg   SpO2 98%   BMI 25.84 kg/m   Physical Exam Vitals and nursing note reviewed.  Constitutional:      General: She is not in acute distress.    Appearance: She is well-developed. She is ill-appearing. She is not toxic-appearing or diaphoretic.  HENT:     Head: Normocephalic and atraumatic.  Eyes:     Conjunctiva/sclera: Conjunctivae normal.     Pupils: Pupils are equal, round, and reactive to light.  Cardiovascular:     Rate and Rhythm: Normal rate and regular rhythm.     Heart sounds: Murmur heard.  Pulmonary:     Effort: Pulmonary effort is normal. No respiratory distress.     Breath sounds: Rhonchi and rales present. No wheezing.  Chest:     Chest wall: Tenderness present.  Abdominal:     Palpations: Abdomen is soft.     Tenderness: There is abdominal tenderness.  Musculoskeletal:        General: No swelling.     Cervical back: Neck supple.     Right lower leg: Edema (mild) present.     Left lower leg: Edema (mild) present.  Skin:    General: Skin is warm and dry.     Capillary Refill: Capillary refill takes less than 2 seconds.  Neurological:     Mental Status: She is alert.  Psychiatric:        Mood and Affect: Mood normal.     (all labs ordered are listed, but only abnormal results are  displayed) Labs Reviewed  BASIC METABOLIC PANEL WITH GFR - Abnormal; Notable for the following components:      Result Value   Chloride 113 (*)    CO2 18 (*)    Glucose, Bld 115 (*)    BUN 27 (*)    Creatinine, Ser 1.32 (*)    GFR, Estimated 41 (*)    All other components within normal limits  CBC - Abnormal; Notable for the following components:   RBC 5.36 (*)    Hemoglobin 16.0 (*)    HCT 49.2 (*)    RDW 17.2 (*)    Platelets 102 (*)    All other components within  normal limits  BRAIN NATRIURETIC PEPTIDE - Abnormal; Notable for the following components:   B Natriuretic Peptide 761.4 (*)    All other components within normal limits  HEPATIC FUNCTION PANEL - Abnormal; Notable for the following components:   Albumin  3.1 (*)    All other components within normal limits  TROPONIN I (HIGH SENSITIVITY) - Abnormal; Notable for the following components:   Troponin I (High Sensitivity) 43 (*)    All other components within normal limits  TROPONIN I (HIGH SENSITIVITY) - Abnormal; Notable for the following components:   Troponin I (High Sensitivity) 45 (*)    All other components within normal limits  RESP PANEL BY RT-PCR (RSV, FLU A&B, COVID)  RVPGX2  LIPASE, BLOOD    EKG: None  Radiology: CT CHEST ABDOMEN PELVIS WO CONTRAST Result Date: 04/07/2024 CLINICAL DATA:  Shortness of breath and tachypnea. History of contrast allergy. History of COPD, CHF, cardiomyopathy. EXAM: CT CHEST, ABDOMEN AND PELVIS WITHOUT CONTRAST TECHNIQUE: Multidetector CT imaging of the chest, abdomen and pelvis was performed following the standard protocol without IV contrast. RADIATION DOSE REDUCTION: This exam was performed according to the departmental dose-optimization program which includes automated exposure control, adjustment of the mA and/or kV according to patient size and/or use of iterative reconstruction technique. COMPARISON:  Chest radiograph 04/07/2024. CT abdomen and pelvis 03/06/2024 FINDINGS: CT  CHEST FINDINGS Cardiovascular: Cardiac pacemaker. Cardiac enlargement. No pericardial effusions. Normal caliber thoracic aorta. Calcification of the aorta and coronary arteries. Postoperative changes consistent with coronary bypass. Mediastinum/Nodes: Thyroid  gland is unremarkable. Esophagus is decompressed. No significant lymphadenopathy. Lungs/Pleura: Emphysematous changes in the lungs. Slight interstitial pattern to the lung periphery likely representing fibrosis. Mild infiltration or atelectasis in the lung bases, greater on the right. Inferior left lingular pulmonary nodule, series 5, image 79, measuring 10 mm. Additional scattered smaller pulmonary nodules. No pleural effusion or pneumothorax. Musculoskeletal: Degenerative changes in the spine. Sternotomy wires. No acute bony abnormalities. CT ABDOMEN PELVIS FINDINGS Hepatobiliary: No focal liver abnormality is seen. No gallstones, gallbladder wall thickening, or biliary dilatation. Pancreas: Unremarkable. No pancreatic ductal dilatation or surrounding inflammatory changes. Spleen: Normal in size without focal abnormality. Adrenals/Urinary Tract: Bilateral adrenal gland nodules, right measures 2.7 cm diameter and 1 Hounsfield unit density. Left measures 13 Hounsfield unit density and 1.9 cm diameter. These are unchanged since prior study. Appearances are consistent with benign fat containing adenomas and no imaging follow-up is indicated. Mild atrophy of the left kidney with respect to the right. No hydronephrosis or hydroureter. No renal, ureteral, or bladder stones. Bladder is normal. Stomach/Bowel: Stomach, small bowel, and colon are not abnormally distended. No wall thickening or inflammatory stranding. Scattered colonic diverticula without evidence of acute diverticulitis. Appendix is normal. Vascular/Lymphatic: Aortic atherosclerosis. No enlarged abdominal or pelvic lymph nodes. Reproductive: Multiple calcified uterine fibroids. No change since prior  study. No abnormal adnexal masses are seen. Other: No abdominal wall hernia or abnormality. No abdominopelvic ascites. Musculoskeletal: Degenerative changes in the spine. No acute bony abnormalities. IMPRESSION: 1. Emphysematous changes and fibrosis in the lungs. Mild infiltration or atelectasis in the lung bases. 2. 10 mm left lingular nodule not seen on prior studies. Consider one of the following in 3 months for both low-risk and high-risk individuals: (a) repeat chest CT, (b) follow-up PET-CT, or (c) tissue sampling. This recommendation follows the consensus statement: Guidelines for Management of Incidental Pulmonary Nodules Detected on CT Images: From the Fleischner Society 2017; Radiology 2017; 284:228-243. 3. Bilateral adrenal gland nodules with benign appearance. No  imaging follow-up is indicated. 4. Aortic atherosclerosis. 5. Multiple calcified uterine fibroids, unchanged. Electronically Signed   By: Elsie Gravely M.D.   On: 04/07/2024 21:55   DG Chest 2 View Result Date: 04/07/2024 CLINICAL DATA:  Occasional chest pain. EXAM: CHEST - 2 VIEW COMPARISON:  March 31, 2024 FINDINGS: There is stable single lead ventricular pacer positioning. Multiple sternal wires are present. The cardiac silhouette is moderately enlarged and unchanged in size. An atrial appendage clip is in place. Mild, stable linear scarring and/or atelectasis is seen within the mid left lung. Mild atelectatic changes are also noted within the bilateral lung bases. No pleural effusion or pneumothorax is identified. Multilevel degenerative changes are seen throughout the thoracic spine. IMPRESSION: Stable cardiomegaly with mild bibasilar atelectasis. Electronically Signed   By: Suzen Dials M.D.   On: 04/07/2024 19:55     Procedures   Medications Ordered in the ED - No data to display                                  Medical Decision Making Amount and/or Complexity of Data Reviewed Labs: ordered. Radiology:  ordered.    GENESYS COGGESHALL is a 78 y.o. female with a past medical history significant for COPD, hypertension, CKD, CAD with CABG, atrial fibrillation on Xarelto  therapy, CHF with pacemaker, previous esophageal dilations, previous cardiac arrest GERD, previous idiopathic angioedema, and mitral regurg who presents with worsening chest pain, shortness of breath, fatigue, lightheadedness, no energy, and several weeks of abdominal discomfort with constipation.  According to patient, she had some food poisoning several weeks ago with nausea and vomiting that improved.  She reports that over the last few days she started having some chest tightness and shortness of breath.  She also has some chest pressure and has abdominal pain that she has had on and off as well.  She reports nausea but no vomiting.  She reports no diarrhea and has some chronic constipation.  Denies any trauma.  Denies any recent sick contacts and only reports mild cough.her primary concern though is the shortness of breath and discomfort in her chest and abdomen.  No back pain reported.  No flank pain.  She reports some chronic edema in legs that seems similar to prior.  He reportedly went to urgent care and they said her EKG was concerning so they sent her in for evaluation given the chest discomfort and shortness of breath.  EKG on arrival seems similar to prior with some paced beats PVCs and underlying suspected A-fib.  Her lungs had rhonchi rales and did not have wheezing.  Her chest was slightly tender and her abdomen was also tender.  Bowel sounds were appreciated.  Flanks and back nontender.  Very mild edema in leg but she has intact pulses.  Patient is quite tachypneic when I was in the room with her and even talking made her very winded.  Patient reports this breathing difficulty is new.  10:50 PM Patient's workup initially returned.  She is still quite tachypneic.  She is reporting some dry cough.  Her labs began to return and  troponin seems stable at 43, will trend.  Her metabolic panel is also similar to prior and she has no leukocytosis.  Will I am concerned however is her CT of the torso shows possible mild infiltrate and given her increased work of breathing, this cough, the fatigue, and lack of energy, I am  worried about the 78 year old going home given her worsening symptoms her last few days.  I do feel with her increased work of breathing and tachypnea she needs admission for IV antibiotics.  She is still waiting on the results of her hepatic function, lipase, and BNP however it does not appear to have a surgical problem in her abdomen pelvis on CT.  Will order antibiotics and call for admission for her.       Final diagnoses:  Pneumonia due to infectious organism, unspecified laterality, unspecified part of lung  Tachypnea    ED Discharge Orders     None       Clinical Impression: 1. Pneumonia due to infectious organism, unspecified laterality, unspecified part of lung   2. Tachypnea     Disposition: Admit  This note was prepared with assistance of Dragon voice recognition software. Occasional wrong-word or sound-a-like substitutions may have occurred due to the inherent limitations of voice recognition software.     Kaj Vasil, Lonni PARAS, MD 04/07/24 [id number]

## 2024-04-07 NOTE — ED Triage Notes (Signed)
 Pt coming in from urgent care. Urgent care sent her to the hospital due to changes in her EKG.  Pt reports chest pain started about 2 days ago it has been coming and going. Pt reports shortness of breath.

## 2024-04-07 NOTE — ED Triage Notes (Signed)
 Patient reports that she has had SOB x 2 days and states this is a chronic issue. Patient has a history of COPD, CHF, and cardiomyopathy. Patient also c/o nasal congestion. Patient talking in complete sentences.  Patient denies any chest pain.  Patient states she  is compliant with her meds.

## 2024-04-08 ENCOUNTER — Encounter (HOSPITAL_COMMUNITY): Payer: Self-pay | Admitting: Family Medicine

## 2024-04-08 DIAGNOSIS — J441 Chronic obstructive pulmonary disease with (acute) exacerbation: Secondary | ICD-10-CM | POA: Diagnosis not present

## 2024-04-08 DIAGNOSIS — R911 Solitary pulmonary nodule: Secondary | ICD-10-CM | POA: Diagnosis present

## 2024-04-08 LAB — MAGNESIUM: Magnesium: 1.9 mg/dL (ref 1.7–2.4)

## 2024-04-08 LAB — CBC
HCT: 48.2 % — ABNORMAL HIGH (ref 36.0–46.0)
Hemoglobin: 15.5 g/dL — ABNORMAL HIGH (ref 12.0–15.0)
MCH: 29.5 pg (ref 26.0–34.0)
MCHC: 32.2 g/dL (ref 30.0–36.0)
MCV: 91.6 fL (ref 80.0–100.0)
Platelets: 111 K/uL — ABNORMAL LOW (ref 150–400)
RBC: 5.26 MIL/uL — ABNORMAL HIGH (ref 3.87–5.11)
RDW: 17 % — ABNORMAL HIGH (ref 11.5–15.5)
WBC: 6.9 K/uL (ref 4.0–10.5)
nRBC: 0 % (ref 0.0–0.2)

## 2024-04-08 LAB — BASIC METABOLIC PANEL WITH GFR
Anion gap: 8 (ref 5–15)
BUN: 29 mg/dL — ABNORMAL HIGH (ref 8–23)
CO2: 19 mmol/L — ABNORMAL LOW (ref 22–32)
Calcium: 9.3 mg/dL (ref 8.9–10.3)
Chloride: 115 mmol/L — ABNORMAL HIGH (ref 98–111)
Creatinine, Ser: 1.36 mg/dL — ABNORMAL HIGH (ref 0.44–1.00)
GFR, Estimated: 40 mL/min — ABNORMAL LOW (ref >60)
Glucose, Bld: 129 mg/dL — ABNORMAL HIGH (ref 70–99)
Potassium: 3.8 mmol/L (ref 3.5–5.1)
Sodium: 142 mmol/L (ref 135–145)

## 2024-04-08 MED ORDER — RIVAROXABAN 15 MG PO TABS
15.0000 mg | ORAL_TABLET | Freq: Every day | ORAL | Status: DC
  Administered 2024-04-08: 15 mg via ORAL
  Filled 2024-04-08: qty 1

## 2024-04-08 MED ORDER — SODIUM CHLORIDE 0.9% FLUSH
3.0000 mL | Freq: Two times a day (BID) | INTRAVENOUS | Status: DC
  Administered 2024-04-08: 3 mL via INTRAVENOUS

## 2024-04-08 MED ORDER — ONDANSETRON HCL 4 MG PO TABS
4.0000 mg | ORAL_TABLET | Freq: Four times a day (QID) | ORAL | Status: DC | PRN
Start: 2024-04-08 — End: 2024-04-08

## 2024-04-08 MED ORDER — SPIRONOLACTONE 25 MG PO TABS: 25.0000 mg | ORAL_TABLET | ORAL | Status: DC

## 2024-04-08 MED ORDER — SODIUM CHLORIDE 0.9 % IV SOLN: 1.0000 g | INTRAVENOUS | Status: DC

## 2024-04-08 MED ORDER — AZITHROMYCIN 500 MG PO TABS: 500.0000 mg | ORAL_TABLET | Freq: Every day | ORAL | 0 refills | Status: AC

## 2024-04-08 MED ORDER — ACETAMINOPHEN 650 MG RE SUPP: 650.0000 mg | Freq: Four times a day (QID) | RECTAL | Status: DC | PRN

## 2024-04-08 MED ORDER — ATORVASTATIN CALCIUM 10 MG PO TABS: 10.0000 mg | ORAL_TABLET | Freq: Every day | ORAL | Status: DC

## 2024-04-08 MED ORDER — HYDRALAZINE HCL 50 MG PO TABS: 75.0000 mg | ORAL_TABLET | Freq: Three times a day (TID) | ORAL | Status: DC

## 2024-04-08 MED ORDER — METHYLPREDNISOLONE SODIUM SUCC 125 MG IJ SOLR
125.0000 mg | Freq: Two times a day (BID) | INTRAMUSCULAR | Status: DC
  Administered 2024-04-08: 125 mg via INTRAVENOUS
  Filled 2024-04-08: qty 2

## 2024-04-08 MED ORDER — SALINE SPRAY 0.65 % NA SOLN: 1.0000 | NASAL | Status: DC | PRN

## 2024-04-08 MED ORDER — ALLOPURINOL 100 MG PO TABS: 100.0000 mg | ORAL_TABLET | Freq: Every day | ORAL | Status: DC

## 2024-04-08 MED ORDER — LOSARTAN POTASSIUM 50 MG PO TABS: 100.0000 mg | ORAL_TABLET | Freq: Every day | ORAL | Status: DC

## 2024-04-08 MED ORDER — IPRATROPIUM-ALBUTEROL 0.5-2.5 (3) MG/3ML IN SOLN: 3.0000 mL | RESPIRATORY_TRACT | Status: DC | PRN

## 2024-04-08 MED ORDER — SENNOSIDES-DOCUSATE SODIUM 8.6-50 MG PO TABS: 1.0000 | ORAL_TABLET | Freq: Every evening | ORAL | Status: DC | PRN

## 2024-04-08 MED ORDER — PANTOPRAZOLE SODIUM 40 MG PO TBEC: 40.0000 mg | DELAYED_RELEASE_TABLET | Freq: Every day | ORAL | Status: DC

## 2024-04-08 MED ORDER — TORSEMIDE 20 MG PO TABS: 20.0000 mg | ORAL_TABLET | Freq: Every day | ORAL | Status: DC

## 2024-04-08 MED ORDER — PREDNISONE 20 MG PO TABS: 40.0000 mg | ORAL_TABLET | Freq: Every day | ORAL | Status: DC

## 2024-04-08 MED ORDER — ACETAMINOPHEN 325 MG PO TABS
650.0000 mg | ORAL_TABLET | Freq: Four times a day (QID) | ORAL | Status: DC | PRN
  Administered 2024-04-08: 650 mg via ORAL
  Filled 2024-04-08: qty 2

## 2024-04-08 MED ORDER — GABAPENTIN 100 MG PO CAPS
100.0000 mg | ORAL_CAPSULE | Freq: Every day | ORAL | Status: DC
  Administered 2024-04-08: 100 mg via ORAL
  Filled 2024-04-08: qty 1

## 2024-04-08 MED ORDER — ONDANSETRON HCL 4 MG/2ML IJ SOLN: 4.0000 mg | Freq: Four times a day (QID) | INTRAMUSCULAR | Status: DC | PRN

## 2024-04-08 MED ORDER — PREDNISONE 20 MG PO TABS: 40.0000 mg | ORAL_TABLET | Freq: Every day | ORAL | 0 refills | Status: AC

## 2024-04-08 NOTE — ED Notes (Addendum)
 Patient was given a cup of ice water.

## 2024-04-08 NOTE — ED Notes (Signed)
 PT and MD had conversation about DC. Pt wishes to go home MD okay with DC at this time. Called pt family contact [phone] and ride will be here in approx 30 min.

## 2024-04-08 NOTE — Discharge Summary (Signed)
 Physician Discharge Summary  [name] [name] FMW:[id number] DOB: [birth year] DOA: 04/07/2024  PCP: Joshua Debby CROME, MD  Admit date: 04/07/2024 Discharge date: 04/08/2024  Admitted From: Home Disposition: Home  Recommendations for Outpatient Follow-up:  Follow up with PCP in 1 week with repeat CBC/BMP Recommend outpatient evaluation and follow-up with pulmonary Outpatient follow-up with heart failure team as scheduled Follow up in ED if symptoms worsen or new appear   Home Health: No Equipment/Devices: None  Discharge Condition: Stable CODE STATUS: Full Diet recommendation: Heart healthy  Brief/Interim Summary: 78 y.o. female with medical history significant for COPD, hypertension, type 2 diabetes mellitus, CKD 3B, CAD status post CABG, atrial fibrillation on Xarelto , and cardiomyopathy with EF 25% presented with shortness of breath, cough and congestion for presentation, he was tachypneic on room air with creatinine of 1.32, normal WBC, troponin of 43, BNP of 761 and negative COVID, RSV and influenza PCR.  Chest x-ray showed stable cardiomegaly with mild bibasilar atelectasis.  CT of chest abdomen and pelvis without contrast showed emphysematous lungs along with left lingular nodule. She was started on nebs and Solu-Medrol .  Subsequently her condition has improved.  She feels much better and wants to go home today.  Discharge patient home today.  Discharge Diagnoses:   COPD exacerbation Ongoing tobacco use -Imaging as above.  Treated with Solu-Medrol  and nebs. Subsequently her condition has improved.  She feels much better and wants to go home today.  Discharge patient home today on prednisone  40 mg daily for 7 days and continue home inhaled regimen. - Counseled regarding tobacco cessation - Zithromax  for 3 days on discharge  Chronic combined systolic and diastolic heart failure Hypertension CAD status post CABG: Currently chest pain-free - EF was 25% on echo from June 2025.   Compensated currently.  Continue current home regimen including torsemide ,Aldactone , losartan  and hydralazine .  Diet and fluid restriction.  Outpatient follow-up with heart failure team/cardiology  CKD stage IIIb - Currently at baseline.  Outpatient follow-up  Permanent A-fib - With history of PPM and AV nodal ablation.  Currently mild intermittent tachycardia present.  Continue Xarelto .  Outpatient follow-up with cardiology  Moderate MR - Outpatient follow-up with cardiology  Lung nodule - Outpatient follow-up with pulmonary    Discharge Instructions  Discharge Instructions     Diet - low sodium heart healthy   Complete by: As directed    Increase activity slowly   Complete by: As directed       Allergies as of 04/08/2024       Reactions   Bee Venom Anaphylaxis   Ivp Dye [iodinated Contrast Media] Anaphylaxis   Ace Inhibitors Other (See Comments)   angioedema   Atenolol Other (See Comments)   severe headaches   Codeine Nausea And Vomiting   Entresto  [sacubitril -valsartan ] Other (See Comments)   Chest pain    Isosorbide  Other (See Comments)   Severe headaches   Shrimp [shellfish Allergy] Swelling   Simvastatin Other (See Comments)   not sure   Jardiance [empagliflozin] Other (See Comments)   Caused Boils   Penicillin G Rash, Dermatitis        Medication List     STOP taking these medications    VICKS VAPOR INHALER IN       TAKE these medications    acetaminophen  500 MG tablet Commonly known as: TYLENOL  Take 500 mg by mouth as needed for mild pain (pain score 1-3) or moderate pain (pain score 4-6).   Airsupra  90-80 MCG/ACT Aero Generic drug:  Albuterol -Budesonide  Inhale 2 puffs into the lungs every 4 (four) hours as needed.   Alcohol  Pads 70 % Pads 1 Act by Does not apply route 2 (two) times daily.   allopurinol  100 MG tablet Commonly known as: ZYLOPRIM  Take 1 tablet (100 mg total) by mouth daily. NEEDS FOLLOW UP APPOINTMENT FOR MORE REFILLS    atorvastatin  10 MG tablet Commonly known as: LIPITOR  TAKE 1 TABLET BY MOUTH EVERYDAY AT BEDTIME   azithromycin  500 MG tablet Commonly known as: Zithromax  Take 1 tablet (500 mg total) by mouth daily for 3 days.   gabapentin  100 MG capsule Commonly known as: NEURONTIN  TAKE 1 CAPSULE BY MOUTH EVERYDAY AT BEDTIME   hydrALAZINE  25 MG tablet Commonly known as: APRESOLINE  Take 3 tablets (75 mg total) by mouth 3 (three) times daily.   losartan  100 MG tablet Commonly known as: COZAAR  Take 1 tablet (100 mg total) by mouth daily.   ondansetron  4 MG tablet Commonly known as: ZOFRAN  Take 1 tablet (4 mg total) by mouth every 8 (eight) hours as needed for nausea or vomiting.   ONE TOUCH ULTRA 2 w/Device Kit 1 Act by Does not apply route 2 (two) times daily.   OneTouch Ultra test strip Generic drug: glucose blood 1 each by Other route 2 (two) times daily. as directed   pantoprazole  40 MG tablet Commonly known as: PROTONIX  TAKE 1 TABLET BY MOUTH EVERY DAY   predniSONE  20 MG tablet Commonly known as: DELTASONE  Take 2 tablets (40 mg total) by mouth daily with breakfast for 7 days. Start taking on: April 09, 2024   spironolactone  25 MG tablet Commonly known as: ALDACTONE  TAKE 1 TABLET (25 MG TOTAL) BY MOUTH DAILY. What changed: when to take this   sucralfate  1 g tablet Commonly known as: Carafate  Take 1 tablet (1 g total) by mouth 4 (four) times daily -  with meals and at bedtime.   torsemide  20 MG tablet Commonly known as: DEMADEX  Take 1 tablet (20 mg total) by mouth daily. What changed: See the new instructions.   Trelegy Ellipta  100-62.5-25 MCG/ACT Aepb Generic drug: Fluticasone -Umeclidin-Vilant INHALE 1 PUFF INTO THE LUNGS DAILY   Xarelto  15 MG Tabs tablet Generic drug: Rivaroxaban  TAKE 1 TABLET (15 MG TOTAL) BY MOUTH DAILY WITH SUPPER        Follow-up Information     Joshua Debby CROME, MD. Schedule an appointment as soon as possible for a visit in 1 week(s).    Specialty: Internal Medicine Contact information: [address] [place] [address] [phone]                Allergies  Allergen Reactions   Bee Venom Anaphylaxis   Ivp Dye [Iodinated Contrast Media] Anaphylaxis   Ace Inhibitors Other (See Comments)    angioedema   Atenolol Other (See Comments)    severe headaches   Codeine Nausea And Vomiting   Entresto  [Sacubitril -Valsartan ] Other (See Comments)    Chest pain    Isosorbide  Other (See Comments)    Severe headaches   Shrimp [Shellfish Allergy] Swelling   Simvastatin Other (See Comments)    not sure   Jardiance [Empagliflozin] Other (See Comments)    Caused Boils    Penicillin G Rash and Dermatitis    Consultations: None   Procedures/Studies: CT CHEST ABDOMEN PELVIS WO CONTRAST Result Date: 04/07/2024 CLINICAL DATA:  Shortness of breath and tachypnea. History of contrast allergy. History of COPD, CHF, cardiomyopathy. EXAM: CT CHEST, ABDOMEN AND PELVIS WITHOUT CONTRAST TECHNIQUE: Multidetector  CT imaging of the chest, abdomen and pelvis was performed following the standard protocol without IV contrast. RADIATION DOSE REDUCTION: This exam was performed according to the departmental dose-optimization program which includes automated exposure control, adjustment of the mA and/or kV according to patient size and/or use of iterative reconstruction technique. COMPARISON:  Chest radiograph 04/07/2024. CT abdomen and pelvis 03/06/2024 FINDINGS: CT CHEST FINDINGS Cardiovascular: Cardiac pacemaker. Cardiac enlargement. No pericardial effusions. Normal caliber thoracic aorta. Calcification of the aorta and coronary arteries. Postoperative changes consistent with coronary bypass. Mediastinum/Nodes: Thyroid  gland is unremarkable. Esophagus is decompressed. No significant lymphadenopathy. Lungs/Pleura: Emphysematous changes in the lungs. Slight interstitial pattern to the lung periphery likely representing fibrosis. Mild  infiltration or atelectasis in the lung bases, greater on the right. Inferior left lingular pulmonary nodule, series 5, image 79, measuring 10 mm. Additional scattered smaller pulmonary nodules. No pleural effusion or pneumothorax. Musculoskeletal: Degenerative changes in the spine. Sternotomy wires. No acute bony abnormalities. CT ABDOMEN PELVIS FINDINGS Hepatobiliary: No focal liver abnormality is seen. No gallstones, gallbladder wall thickening, or biliary dilatation. Pancreas: Unremarkable. No pancreatic ductal dilatation or surrounding inflammatory changes. Spleen: Normal in size without focal abnormality. Adrenals/Urinary Tract: Bilateral adrenal gland nodules, right measures 2.7 cm diameter and 1 Hounsfield unit density. Left measures 13 Hounsfield unit density and 1.9 cm diameter. These are unchanged since prior study. Appearances are consistent with benign fat containing adenomas and no imaging follow-up is indicated. Mild atrophy of the left kidney with respect to the right. No hydronephrosis or hydroureter. No renal, ureteral, or bladder stones. Bladder is normal. Stomach/Bowel: Stomach, small bowel, and colon are not abnormally distended. No wall thickening or inflammatory stranding. Scattered colonic diverticula without evidence of acute diverticulitis. Appendix is normal. Vascular/Lymphatic: Aortic atherosclerosis. No enlarged abdominal or pelvic lymph nodes. Reproductive: Multiple calcified uterine fibroids. No change since prior study. No abnormal adnexal masses are seen. Other: No abdominal wall hernia or abnormality. No abdominopelvic ascites. Musculoskeletal: Degenerative changes in the spine. No acute bony abnormalities. IMPRESSION: 1. Emphysematous changes and fibrosis in the lungs. Mild infiltration or atelectasis in the lung bases. 2. 10 mm left lingular nodule not seen on prior studies. Consider one of the following in 3 months for both low-risk and high-risk individuals: (a) repeat chest CT,  (b) follow-up PET-CT, or (c) tissue sampling. This recommendation follows the consensus statement: Guidelines for Management of Incidental Pulmonary Nodules Detected on CT Images: From the Fleischner Society 2017; Radiology 2017; 284:228-243. 3. Bilateral adrenal gland nodules with benign appearance. No imaging follow-up is indicated. 4. Aortic atherosclerosis. 5. Multiple calcified uterine fibroids, unchanged. Electronically Signed   By: Elsie Gravely M.D.   On: 04/07/2024 21:55   DG Chest 2 View Result Date: 04/07/2024 CLINICAL DATA:  Occasional chest pain. EXAM: CHEST - 2 VIEW COMPARISON:  March 31, 2024 FINDINGS: There is stable single lead ventricular pacer positioning. Multiple sternal wires are present. The cardiac silhouette is moderately enlarged and unchanged in size. An atrial appendage clip is in place. Mild, stable linear scarring and/or atelectasis is seen within the mid left lung. Mild atelectatic changes are also noted within the bilateral lung bases. No pleural effusion or pneumothorax is identified. Multilevel degenerative changes are seen throughout the thoracic spine. IMPRESSION: Stable cardiomegaly with mild bibasilar atelectasis. Electronically Signed   By: Suzen Dials M.D.   On: 04/07/2024 19:55   DG Chest Portable 1 View Result Date: 03/31/2024 EXAM: 1 VIEW XRAY OF THE CHEST 03/31/2024 12:12:00 PM COMPARISON: 03/05/2024 CLINICAL HISTORY: SOB  for about 3 days, also states has afib. FINDINGS: LUNGS AND PLEURA: No focal pulmonary opacity. No pulmonary edema. No pleural effusion. No pneumothorax. Unchanged scar within the left midlung adjacent to the left heart border. HEART AND MEDIASTINUM: Stable cardiac enlargement. Left chest wall single lead pacer with lead in the right ventricle. Status post median sternotomy and left atrial clipping. BONES AND SOFT TISSUES: No acute osseous abnormality. IMPRESSION: 1. No acute findings. 2. Stable cardiac enlargement with left chest wall  single lead pacer in the right ventricle, status post median sternotomy, and left atrial clipping. Electronically signed by: Waddell Calk MD 03/31/2024 12:27 PM EDT RP Workstation: [id number]      Subjective: Patient seen and examined at bedside.   And wants go home today.  No fever, vomiting, worsening shortness breath reported  Discharge Exam: Vitals:   04/08/24 0748 04/08/24 0750  BP:  (!) 128/117  Pulse:  (!) 101  Resp:  (!) 30  Temp: 98.7 F (37.1 C)   SpO2:  98%    General: Pt is alert, awake, not in acute distress elderly female lying in bed. Cardiovascular: Intermittent tachycardia present; S1/S2 + Respiratory: bilateral decreased breath sounds at bases with scattered crackles and intermittent tachypnea Abdominal: Soft, NT, ND, bowel sounds + Extremities: Trace lower extremity edema; no cyanosis    The results of significant diagnostics from this hospitalization (including imaging, microbiology, ancillary and laboratory) are listed below for reference.     Microbiology: Recent Results (from the past 240 hours)  Resp panel by RT-PCR (RSV, Flu A&B, Covid) Anterior Nasal Swab     Status: None   Collection Time: 04/07/24 10:08 PM   Specimen: Anterior Nasal Swab  Result Value Ref Range Status   SARS Coronavirus 2 by RT PCR NEGATIVE NEGATIVE Final   Influenza A by PCR NEGATIVE NEGATIVE Final   Influenza B by PCR NEGATIVE NEGATIVE Final    Comment: (NOTE) The Xpert Xpress SARS-CoV-2/FLU/RSV plus assay is intended as an aid in the diagnosis of influenza from Nasopharyngeal swab specimens and should not be used as a sole basis for treatment. Nasal washings and aspirates are unacceptable for Xpert Xpress SARS-CoV-2/FLU/RSV testing.  Fact Sheet for Patients: BloggerCourse.com  Fact Sheet for Healthcare Providers: SeriousBroker.it  This test is not yet approved or cleared by the [place]  FDA and has been  authorized for detection and/or diagnosis of SARS-CoV-2 by FDA under an Emergency Use Authorization (EUA). This EUA will remain in effect (meaning this test can be used) for the duration of the COVID-19 declaration under Section 564(b)(1) of the Act, 21 U.S.C. section 360bbb-3(b)(1), unless the authorization is terminated or revoked.     Resp Syncytial Virus by PCR NEGATIVE NEGATIVE Final    Comment: (NOTE) Fact Sheet for Patients: BloggerCourse.com  Fact Sheet for Healthcare Providers: SeriousBroker.it  This test is not yet approved or cleared by the [place]  FDA and has been authorized for detection and/or diagnosis of SARS-CoV-2 by FDA under an Emergency Use Authorization (EUA). This EUA will remain in effect (meaning this test can be used) for the duration of the COVID-19 declaration under Section 564(b)(1) of the Act, 21 U.S.C. section 360bbb-3(b)(1), unless the authorization is terminated or revoked.  Performed at [facility], [address] [place], [place] [address]      Labs: BNP (last 3 results) Recent Labs    03/18/24 1109 03/31/24 1248 04/07/24 2208  BNP 461.8* 942.5* 761.4*   Basic Metabolic Panel: Recent Labs  Lab  04/07/24 1925 04/08/24 0424  NA 141 142  K 3.9 3.8  CL 113* 115*  CO2 18* 19*  GLUCOSE 115* 129*  BUN 27* 29*  CREATININE 1.32* 1.36*  CALCIUM  9.5 9.3  MG  --  1.9   Liver Function Tests: Recent Labs  Lab 04/07/24 2208  AST 21  ALT 13  ALKPHOS 90  BILITOT 0.9  PROT 6.5  ALBUMIN  3.1*   Recent Labs  Lab 04/07/24 2208  LIPASE 30   No results for input(s): AMMONIA in the last 168 hours. CBC: Recent Labs  Lab 04/07/24 1925 04/08/24 0424  WBC 5.5 6.9  HGB 16.0* 15.5*  HCT 49.2* 48.2*  MCV 91.8 91.6  PLT 102* 111*   Cardiac Enzymes: No results for input(s): CKTOTAL, CKMB, CKMBINDEX, TROPONINI in the last 168 hours. BNP: Invalid input(s):  POCBNP CBG: No results for input(s): GLUCAP in the last 168 hours. D-Dimer No results for input(s): DDIMER in the last 72 hours. Hgb A1c No results for input(s): HGBA1C in the last 72 hours. Lipid Profile No results for input(s): CHOL, HDL, LDLCALC, TRIG, CHOLHDL, LDLDIRECT in the last 72 hours. Thyroid  function studies No results for input(s): TSH, T4TOTAL, T3FREE, THYROIDAB in the last 72 hours.  Invalid input(s): FREET3 Anemia work up No results for input(s): VITAMINB12, FOLATE, FERRITIN, TIBC, IRON, RETICCTPCT in the last 72 hours. Urinalysis    Component Value Date/Time   COLORURINE YELLOW 01/30/2024 1342   APPEARANCEUR CLEAR 01/30/2024 1342   LABSPEC 1.025 01/30/2024 1342   PHURINE 6.0 01/30/2024 1342   GLUCOSEU NEGATIVE 01/30/2024 1342   HGBUR NEGATIVE 01/30/2024 1342   BILIRUBINUR NEGATIVE 01/30/2024 1342   BILIRUBINUR neg 07/21/2023 1154   KETONESUR NEGATIVE 01/30/2024 1342   PROTEINUR Positive (A) 07/21/2023 1154   PROTEINUR 100 (A) 06/06/2022 0830   UROBILINOGEN 0.2 01/30/2024 1342   NITRITE NEGATIVE 01/30/2024 1342   LEUKOCYTESUR NEGATIVE 01/30/2024 1342   Sepsis Labs Recent Labs  Lab 04/07/24 1925 04/08/24 0424  WBC 5.5 6.9   Microbiology Recent Results (from the past 240 hours)  Resp panel by RT-PCR (RSV, Flu A&B, Covid) Anterior Nasal Swab     Status: None   Collection Time: 04/07/24 10:08 PM   Specimen: Anterior Nasal Swab  Result Value Ref Range Status   SARS Coronavirus 2 by RT PCR NEGATIVE NEGATIVE Final   Influenza A by PCR NEGATIVE NEGATIVE Final   Influenza B by PCR NEGATIVE NEGATIVE Final    Comment: (NOTE) The Xpert Xpress SARS-CoV-2/FLU/RSV plus assay is intended as an aid in the diagnosis of influenza from Nasopharyngeal swab specimens and should not be used as a sole basis for treatment. Nasal washings and aspirates are unacceptable for Xpert Xpress SARS-CoV-2/FLU/RSV testing.  Fact Sheet for  Patients: BloggerCourse.com  Fact Sheet for Healthcare Providers: SeriousBroker.it  This test is not yet approved or cleared by the [place]  FDA and has been authorized for detection and/or diagnosis of SARS-CoV-2 by FDA under an Emergency Use Authorization (EUA). This EUA will remain in effect (meaning this test can be used) for the duration of the COVID-19 declaration under Section 564(b)(1) of the Act, 21 U.S.C. section 360bbb-3(b)(1), unless the authorization is terminated or revoked.     Resp Syncytial Virus by PCR NEGATIVE NEGATIVE Final    Comment: (NOTE) Fact Sheet for Patients: BloggerCourse.com  Fact Sheet for Healthcare Providers: SeriousBroker.it  This test is not yet approved or cleared by the [place]  FDA and has been authorized for detection and/or diagnosis of  SARS-CoV-2 by FDA under an Emergency Use Authorization (EUA). This EUA will remain in effect (meaning this test can be used) for the duration of the COVID-19 declaration under Section 564(b)(1) of the Act, 21 U.S.C. section 360bbb-3(b)(1), unless the authorization is terminated or revoked.  Performed at [facility], [address] [place], [place] [address]      Time coordinating discharge: 35 minutes  SIGNED:   Sophie Mao, MD  [facility] 04/08/2024, 9:35 AM

## 2024-04-08 NOTE — ED Notes (Signed)
 PT states she doesn't mind getting an IV she just wants some one that knows what they are doing because she has stuck many of times

## 2024-04-08 NOTE — ED Notes (Signed)
 Assumed pt care at this time, tidy room, replace EKG leads. Pt denies pain or need for toileting. Verbalized understanding of all precautions.

## 2024-04-08 NOTE — ED Notes (Signed)
 PT verbalized understanding of DC instructions.Pt amb out of ED with all belongings   Pt left before morning meds. Family coming to pick up called family at 1000 and ETA was 15 min pt waiting in lobby.    Pt amb to RR before leaving

## 2024-04-08 NOTE — H&P (Addendum)
 History and Physical    [name] [name] FMW:[id number] DOB: 1946/02/19 DOA: 04/07/2024  PCP: Joshua Debby CROME, MD   Patient coming from: Home   Chief Complaint: SOB, cough, nasal congestion   HPI: [name] [name] is a 78 y.o. female with medical history significant for COPD, hypertension, type 2 diabetes mellitus, CKD 3B, CAD status post CABG, atrial fibrillation on Xarelto , and cardiomyopathy with EF 25% who presents with shortness of breath, cough, and sinus congestion.  Patient reports 2 days of sinus congestion, increased fatigue, nonproductive cough, and worsening dyspnea.  She denies any associated fever, chills, chest pain, or leg swelling.  ED Course: Upon arrival to the ED, patient is found to be afebrile and saturating well on room air with tachypnea, normal HR, and elevated BP.  Labs are most notable for creatinine 1.32, normal WBC, platelets 102,000, troponin 43, BNP 761, and negative COVID, RSV, and influenza PCR.  Patient was treated with antibiotics in the ED.  Review of Systems:  All other systems reviewed and apart from HPI, are negative.  Past Medical History:  Diagnosis Date   (HFimpEF) heart failure with improved ejection fraction (HCC)    a. 07/2018 Echo: EF 30-35%; b. 11/2019 Echo: EF 55-60%, no rwma, Gr2 DD, Nl RV size/fxn. Mild BAE. Mild MR/AI.   Arthritis    Asthma    Atopic dermatitis    CAD (coronary artery disease)    a. 2016 s/p CABG x 2 (LIMA->LAD, VG->OM); b. 08/2018 MV: EF 44%, no ischemia/infact.   Cardiac arrest (HCC) 10/2014   Cardiomyopathy, ischemic    a. 07/2018 Echo: EF 30-35%; 11/2019 Echo: EF 55-60%.   Carotid arterial disease (HCC)    a. 11/2019 Carotid U/S   CHF (congestive heart failure) (HCC)    CKD (chronic kidney disease), stage III (HCC)    COPD (chronic obstructive pulmonary disease) (HCC)    Diabetes mellitus without complication (HCC)    Dysrhythmia    Esophageal dilatation 2013   GERD (gastroesophageal reflux disease)    Glaucoma     Gout    Headache    Hearing loss    History of blood transfusion    Hypertension    Hypokalemia    Idiopathic angioedema    LGI bleed 08/06/2017   a. felt to be hemorrhoidal during that admission (no drop in Hgb).   Lower back pain    Paroxysmal atrial fibrillation (HCC)    a. Dx 2016-->h/o difficult to control rates (complicated by noncompliance), not felt to be a candidate for ablation or antiarrhythmic due to noncompliance; b. Recurrent AF 2021 - converted w/ IV dilt; c. CHA2DS2VASc = 7-->Xarelto .   Personal history of noncompliance with medical treatment, presenting hazards to health    Pneumonia    Prediabetes    Presence of permanent cardiac pacemaker    Right leg numbness    S/P CABG x 2 with clipping of LA appendage 11/14/2014   LIMA to LAD, SVG to OM, EVH via right thigh   Urine incontinence    Uterine fibroid     Past Surgical History:  Procedure Laterality Date   AV NODE ABLATION N/A 01/18/2021   Procedure: AV NODE ABLATION;  Surgeon: Waddell Danelle ORN, MD;  [facility]: [facility] INVASIVE CV LAB;  Service: Cardiovascular;  Laterality: N/A;   BALLOON DILATION N/A 10/10/2019   Procedure: BALLOON DILATION;  Surgeon: Elicia Claw, MD;  Location: [facility] ENDOSCOPY;  Service: Gastroenterology;  Laterality: N/A;   BIOPSY  10/10/2019   Procedure: BIOPSY;  Surgeon: Elicia Claw, MD;  Location: [facility] ENDOSCOPY;  Service: Gastroenterology;;   BIOPSY  10/11/2019   Procedure: BIOPSY;  Surgeon: Elicia Claw, MD;  Location: [facility] ENDOSCOPY;  Service: Gastroenterology;;   JUVENTINO LLANO N/A 11/21/2022   Procedure: BIVI  ICD UPGRADE;  Surgeon: Waddell Danelle ORN, MD;  Location: [facility] INVASIVE CV LAB;  Service: Cardiovascular;  Laterality: N/A;   CARDIOVERSION N/A 11/18/2014   Procedure: CARDIOVERSION;  Surgeon: Vinie JAYSON Maxcy, MD;  Location: [facility] OR;  Service: Cardiovascular;  Laterality: N/A;   CARDIOVERSION N/A 12/25/2017   Procedure: CARDIOVERSION;  Surgeon: Cherrie Toribio SAUNDERS, MD;  Location: [facility]  ENDOSCOPY;  Service: Cardiovascular;  Laterality: N/A;   CLIPPING OF ATRIAL APPENDAGE N/A 11/14/2014   Procedure: CLIPPING OF ATRIAL APPENDAGE;  Surgeon: Sudie VEAR Laine, MD;  Location: [facility] OR;  Service: Open Heart Surgery;  Laterality: N/A;   COLONOSCOPY  2013   COLONOSCOPY WITH PROPOFOL  N/A 10/11/2019   Procedure: COLONOSCOPY WITH PROPOFOL ;  Surgeon: Elicia Claw, MD;  Location: [facility] ENDOSCOPY;  Service: Gastroenterology;  Laterality: N/A;   CORONARY ARTERY BYPASS GRAFT N/A 11/14/2014   Procedure: CORONARY ARTERY BYPASS GRAFTING (CABG)TIMES 2 USING LEFT INTERNAL MAMMARY ARTERY AND RIGHT SAPHENOUS VEIN HARVESTED ENDOSCOPICALLY;  Surgeon: Sudie VEAR Laine, MD;  Location: [facility] OR;  Service: Open Heart Surgery;  Laterality: N/A;   ESOPHAGOGASTRODUODENOSCOPY (EGD) WITH PROPOFOL  N/A 10/10/2019   Procedure: ESOPHAGOGASTRODUODENOSCOPY (EGD) WITH PROPOFOL ;  Surgeon: Elicia Claw, MD;  Location: [facility] ENDOSCOPY;  Service: Gastroenterology;  Laterality: N/A;   ESOPHAGOGASTRODUODENOSCOPY (EGD) WITH PROPOFOL  N/A 10/27/2019   Procedure: ESOPHAGOGASTRODUODENOSCOPY (EGD) WITH PROPOFOL ;  Surgeon: Saintclair Jasper, MD;  Location: [facility] ENDOSCOPY;  Service: Gastroenterology;  Laterality: N/A;   GIVENS CAPSULE STUDY N/A 10/27/2019   Procedure: GIVENS CAPSULE STUDY;  Surgeon: Saintclair Jasper, MD;  Location: [facility] ENDOSCOPY;  Service: Gastroenterology;  Laterality: N/A;   INSERT / REPLACE / REMOVE PACEMAKER     LEFT HEART CATHETERIZATION WITH CORONARY ANGIOGRAM N/A 11/04/2014   Procedure: LEFT HEART CATHETERIZATION WITH CORONARY ANGIOGRAM;  Surgeon: Debby DELENA Sor, MD;  Location: [facility] CATH LAB;  Service: Cardiovascular;  Laterality: N/A;   ORIF ANKLE FRACTURE Right 06/26/2023   Procedure: OPEN REDUCTION INTERNAL FIXATION (ORIF) BIMALLEOLAR ANKLE FRACTURE;  Surgeon: Barton Drape, MD;  Location: [facility] ORS;  Service: Orthopedics;  Laterality: Right;   PACEMAKER IMPLANT N/A 01/18/2021   Procedure: PACEMAKER IMPLANT;  Surgeon:  Waddell Danelle ORN, MD;  [facility]AS[facility] CV LAB;  Service: Cardiovascular;  Laterality: N/A;   PACEMAKER IMPLANT Left    POLYPECTOMY  10/11/2019   Procedure: POLYPECTOMY;  Surgeon: Elicia Claw, MD;  Location: [facility] ENDOSCOPY;  Service: Gastroenterology;;   RIGHT HEART CATH N/A 06/08/2022   Procedure: RIGHT HEART CATH;  Surgeon: Cherrie Toribio SAUNDERS, MD;  Location: [facility] INVASIVE CV LAB;  Service: Cardiovascular;  Laterality: N/A;   TEE WITHOUT CARDIOVERSION N/A 11/14/2014   Procedure: TRANSESOPHAGEAL ECHOCARDIOGRAM (TEE);  Surgeon: Sudie VEAR Laine, MD;  Location: [facility] OR;  Service: Open Heart Surgery;  Laterality: N/A;   TEE WITHOUT CARDIOVERSION N/A 12/25/2017   Procedure: TRANSESOPHAGEAL ECHOCARDIOGRAM (TEE);  Surgeon: Cherrie Toribio SAUNDERS, MD;  Location: [facility] ENDOSCOPY;  Service: Cardiovascular;  Laterality: N/A;   TEMPORARY PACEMAKER INSERTION  11/04/2014   Procedure: TEMPORARY PACEMAKER INSERTION;  Surgeon: Debby DELENA Sor, MD;  Location: [facility] CATH LAB;  Service: Cardiovascular;;   TOOTH EXTRACTION      Social History:   reports that she has been smoking cigarettes. She started smoking about 58 years ago. She has a 28 pack-year  smoking history. She has never used smokeless tobacco. She reports that she does not drink alcohol  and does not use drugs.  Allergies  Allergen Reactions   Bee Venom Anaphylaxis   Ivp Dye [Iodinated Contrast Media] Anaphylaxis   Ace Inhibitors Other (See Comments)    angioedema   Atenolol Other (See Comments)    severe headaches   Codeine Nausea And Vomiting   Entresto  [Sacubitril -Valsartan ] Other (See Comments)    Chest pain    Isosorbide  Other (See Comments)    Severe headaches   Shrimp [Shellfish Allergy] Swelling   Simvastatin Other (See Comments)    not sure   Jardiance [Empagliflozin] Other (See Comments)    Caused Boils    Penicillin G Rash and Dermatitis    Family History  Problem Relation Age of Onset   Cancer Mother        LYMPHOMA   Heart  disease Father    Other Father        TB   CVA Sister    Prostate cancer Brother 54   Diabetes Brother      Prior to Admission medications   Medication Sig Start Date End Date Taking? Authorizing Provider  acetaminophen  (TYLENOL ) 500 MG tablet Take 500 mg by mouth as needed for mild pain (pain score 1-3) or moderate pain (pain score 4-6).   Yes [provider]  Albuterol -Budesonide  (AIRSUPRA ) 90-80 MCG/ACT AERO Inhale 2 puffs into the lungs every 4 (four) hours as needed. 01/10/24  Yes Sagardia, Emil Schanz, MD  allopurinol  (ZYLOPRIM ) 100 MG tablet Take 1 tablet (100 mg total) by mouth daily. NEEDS FOLLOW UP APPOINTMENT FOR MORE REFILLS 11/28/23  Yes Bensimhon, Daniel R, MD  Aromatic Inhalants (VICKS VAPOR INHALER IN) Inhale into the lungs as needed.   Yes [provider]  atorvastatin  (LIPITOR ) 10 MG tablet TAKE 1 TABLET BY MOUTH EVERYDAY AT BEDTIME 07/22/23  Yes Joshua Debby CROME, MD  gabapentin  (NEURONTIN ) 100 MG capsule TAKE 1 CAPSULE BY MOUTH EVERYDAY AT BEDTIME 03/22/24  Yes Gershon Donnice SAUNDERS, DPM  hydrALAZINE  (APRESOLINE ) 25 MG tablet Take 3 tablets (75 mg total) by mouth 3 (three) times daily. 03/18/24  Yes Lee, [place], NP  losartan  (COZAAR ) 100 MG tablet Take 1 tablet (100 mg total) by mouth daily. 12/22/23  Yes Alvia Corean CROME, FNP  ondansetron  (ZOFRAN ) 4 MG tablet Take 1 tablet (4 mg total) by mouth every 8 (eight) hours as needed for nausea or vomiting. 03/06/24  Yes Danford, Lonni SQUIBB, MD  pantoprazole  (PROTONIX ) 40 MG tablet TAKE 1 TABLET BY MOUTH EVERY DAY 01/23/24  Yes Joshua Debby CROME, MD  spironolactone  (ALDACTONE ) 25 MG tablet TAKE 1 TABLET (25 MG TOTAL) BY MOUTH DAILY. Patient taking differently: Take 25 mg by mouth every other day. 12/19/23  Yes Bensimhon, Toribio SAUNDERS, MD  torsemide  (DEMADEX ) 20 MG tablet TAKE 1.5 TABLETS BY MOUTH EVERY DAY Patient taking differently: Take 20 mg by mouth daily. 11/28/23  Yes Waddell Danelle ORN, MD  TRELEGY ELLIPTA  100-62.5-25  MCG/ACT AEPB INHALE 1 PUFF INTO THE LUNGS DAILY 01/24/24  Yes Joshua Debby CROME, MD  XARELTO  15 MG TABS tablet TAKE 1 TABLET (15 MG TOTAL) BY MOUTH DAILY WITH SUPPER 01/09/24  Yes Bensimhon, Toribio SAUNDERS, MD  Alcohol  Swabs  (ALCOHOL  PADS) 70 % PADS 1 Act by Does not apply route 2 (two) times daily. 04/28/23   Joshua Debby CROME, MD  Blood Glucose Monitoring Suppl (ONE TOUCH ULTRA 2) w/Device KIT 1 Act by Does not apply route 2 (  two) times daily. 04/28/23   Joshua Debby CROME, MD  [facility] ULTRA test strip 1 each by Other route 2 (two) times daily. as directed 04/28/23   Joshua Debby CROME, MD  sucralfate  (CARAFATE ) 1 g tablet Take 1 tablet (1 g total) by mouth 4 (four) times daily -  with meals and at bedtime. Patient not taking: Reported on 04/07/2024 03/31/24   Glendia Rocky SAILOR, PA-C  losartan  (COZAAR ) 50 MG tablet Take 1 tablet (50 mg total) by mouth daily. 12/16/22   Bensimhon, Toribio SAUNDERS, MD    Physical Exam: Vitals:   04/07/24 1916 04/07/24 2211 04/07/24 2215 04/07/24 2300  BP:  (!) 169/109 (!) 161/129 (!) 170/93  Pulse:  74 99 (!) 39  Resp:  (!) 24 16 (!) 35  Temp:  98.2 F (36.8 C)    TempSrc:  Oral    SpO2:  100% 100% 98%  Weight: 74.8 kg     Height: 5' 7 (1.702 m)        Constitutional: NAD, no pallor or diaphoresis   Eyes: PERTLA, lids and conjunctivae normal ENMT: Mucous membranes are moist. Posterior pharynx clear of any exudate or lesions.   Neck: supple, no masses  Respiratory: Diminished bilaterally with prolonged expiratory phase. No accessory muscle use.  Cardiovascular: S1 & S2 heard, regular rate and rhythm. Trace lower leg edema.  Abdomen: No tenderness, soft. Bowel sounds active.  Musculoskeletal: no clubbing / cyanosis. No joint deformity upper and lower extremities.   Skin: no significant rashes, lesions, ulcers. Warm, dry, well-perfused. Neurologic: CN 2-12 grossly intact. Moving all extremities. Alert and oriented.  Psychiatric: Calm. Cooperative.    Labs and Imaging on Admission:  I have personally reviewed following labs and imaging studies  CBC: Recent Labs  Lab 04/07/24 1925  WBC 5.5  HGB 16.0*  HCT 49.2*  MCV 91.8  PLT 102*   Basic Metabolic Panel: Recent Labs  Lab 04/07/24 1925  NA 141  K 3.9  CL 113*  CO2 18*  GLUCOSE 115*  BUN 27*  CREATININE 1.32*  CALCIUM  9.5   GFR: Estimated Creatinine Clearance: 36.5 mL/min (A) (by C-G formula based on SCr of 1.32 mg/dL (H)). Liver Function Tests: Recent Labs  Lab 04/07/24 2208  AST 21  ALT 13  ALKPHOS 90  BILITOT 0.9  PROT 6.5  ALBUMIN  3.1*   Recent Labs  Lab 04/07/24 2208  LIPASE 30   No results for input(s): AMMONIA in the last 168 hours. Coagulation Profile: No results for input(s): INR, PROTIME in the last 168 hours. Cardiac Enzymes: No results for input(s): CKTOTAL, CKMB, CKMBINDEX, TROPONINI in the last 168 hours. BNP (last 3 results) No results for input(s): PROBNP in the last 8760 hours. HbA1C: No results for input(s): HGBA1C in the last 72 hours. CBG: No results for input(s): GLUCAP in the last 168 hours. Lipid Profile: No results for input(s): CHOL, HDL, LDLCALC, TRIG, CHOLHDL, LDLDIRECT in the last 72 hours. Thyroid  Function Tests: No results for input(s): TSH, T4TOTAL, FREET4, T3FREE, THYROIDAB in the last 72 hours. Anemia Panel: No results for input(s): VITAMINB12, FOLATE, FERRITIN, TIBC, IRON, RETICCTPCT in the last 72 hours. Urine analysis:    Component Value Date/Time   COLORURINE YELLOW 01/30/2024 1342   APPEARANCEUR CLEAR 01/30/2024 1342   LABSPEC 1.025 01/30/2024 1342   PHURINE 6.0 01/30/2024 1342   GLUCOSEU NEGATIVE 01/30/2024 1342   HGBUR NEGATIVE 01/30/2024 1342   BILIRUBINUR NEGATIVE 01/30/2024 1342   BILIRUBINUR neg 07/21/2023 1154   KETONESUR NEGATIVE  01/30/2024 1342   PROTEINUR Positive (A) 07/21/2023 1154   PROTEINUR 100 (A) 06/06/2022 0830   UROBILINOGEN 0.2 01/30/2024 1342   NITRITE NEGATIVE  01/30/2024 1342   LEUKOCYTESUR NEGATIVE 01/30/2024 1342   Sepsis Labs: @LABRCNTIP (procalcitonin:4,lacticidven:4) ) Recent Results (from the past 240 hours)  Resp panel by RT-PCR (RSV, Flu A&B, Covid) Anterior Nasal Swab     Status: None   Collection Time: 04/07/24 10:08 PM   Specimen: Anterior Nasal Swab  Result Value Ref Range Status   SARS Coronavirus 2 by RT PCR NEGATIVE NEGATIVE Final   Influenza A by PCR NEGATIVE NEGATIVE Final   Influenza B by PCR NEGATIVE NEGATIVE Final    Comment: (NOTE) The Xpert Xpress SARS-CoV-2/FLU/RSV plus assay is intended as an aid in the diagnosis of influenza from Nasopharyngeal swab specimens and should not be used as a sole basis for treatment. Nasal washings and aspirates are unacceptable for Xpert Xpress SARS-CoV-2/FLU/RSV testing.  Fact Sheet for Patients: BloggerCourse.com  Fact Sheet for Healthcare Providers: SeriousBroker.it  This test is not yet approved or cleared by the [place]  FDA and has been authorized for detection and/or diagnosis of SARS-CoV-2 by FDA under an Emergency Use Authorization (EUA). This EUA will remain in effect (meaning this test can be used) for the duration of the COVID-19 declaration under Section 564(b)(1) of the Act, 21 U.S.C. section 360bbb-3(b)(1), unless the authorization is terminated or revoked.     Resp Syncytial Virus by PCR NEGATIVE NEGATIVE Final    Comment: (NOTE) Fact Sheet for Patients: BloggerCourse.com  Fact Sheet for Healthcare Providers: SeriousBroker.it  This test is not yet approved or cleared by the [place]  FDA and has been authorized for detection and/or diagnosis of SARS-CoV-2 by FDA under an Emergency Use Authorization (EUA). This EUA will remain in effect (meaning this test can be used) for the duration of the COVID-19 declaration under Section 564(b)(1) of the Act, 21  U.S.C. section 360bbb-3(b)(1), unless the authorization is terminated or revoked.  Performed at [facility], [address] [place], [place] [address]      Radiological Exams on Admission: CT CHEST ABDOMEN PELVIS WO CONTRAST Result Date: 04/07/2024 CLINICAL DATA:  Shortness of breath and tachypnea. History of contrast allergy. History of COPD, CHF, cardiomyopathy. EXAM: CT CHEST, ABDOMEN AND PELVIS WITHOUT CONTRAST TECHNIQUE: Multidetector CT imaging of the chest, abdomen and pelvis was performed following the standard protocol without IV contrast. RADIATION DOSE REDUCTION: This exam was performed according to the departmental dose-optimization program which includes automated exposure control, adjustment of the mA and/or kV according to patient size and/or use of iterative reconstruction technique. COMPARISON:  Chest radiograph 04/07/2024. CT abdomen and pelvis 03/06/2024 FINDINGS: CT CHEST FINDINGS Cardiovascular: Cardiac pacemaker. Cardiac enlargement. No pericardial effusions. Normal caliber thoracic aorta. Calcification of the aorta and coronary arteries. Postoperative changes consistent with coronary bypass. Mediastinum/Nodes: Thyroid  gland is unremarkable. Esophagus is decompressed. No significant lymphadenopathy. Lungs/Pleura: Emphysematous changes in the lungs. Slight interstitial pattern to the lung periphery likely representing fibrosis. Mild infiltration or atelectasis in the lung bases, greater on the right. Inferior left lingular pulmonary nodule, series 5, image 79, measuring 10 mm. Additional scattered smaller pulmonary nodules. No pleural effusion or pneumothorax. Musculoskeletal: Degenerative changes in the spine. Sternotomy wires. No acute bony abnormalities. CT ABDOMEN PELVIS FINDINGS Hepatobiliary: No focal liver abnormality is seen. No gallstones, gallbladder wall thickening, or biliary dilatation. Pancreas: Unremarkable. No pancreatic ductal dilatation or surrounding  inflammatory changes. Spleen: Normal in size without focal abnormality. Adrenals/Urinary  Tract: Bilateral adrenal gland nodules, right measures 2.7 cm diameter and 1 Hounsfield unit density. Left measures 13 Hounsfield unit density and 1.9 cm diameter. These are unchanged since prior study. Appearances are consistent with benign fat containing adenomas and no imaging follow-up is indicated. Mild atrophy of the left kidney with respect to the right. No hydronephrosis or hydroureter. No renal, ureteral, or bladder stones. Bladder is normal. Stomach/Bowel: Stomach, small bowel, and colon are not abnormally distended. No wall thickening or inflammatory stranding. Scattered colonic diverticula without evidence of acute diverticulitis. Appendix is normal. Vascular/Lymphatic: Aortic atherosclerosis. No enlarged abdominal or pelvic lymph nodes. Reproductive: Multiple calcified uterine fibroids. No change since prior study. No abnormal adnexal masses are seen. Other: No abdominal wall hernia or abnormality. No abdominopelvic ascites. Musculoskeletal: Degenerative changes in the spine. No acute bony abnormalities. IMPRESSION: 1. Emphysematous changes and fibrosis in the lungs. Mild infiltration or atelectasis in the lung bases. 2. 10 mm left lingular nodule not seen on prior studies. Consider one of the following in 3 months for both low-risk and high-risk individuals: (a) repeat chest CT, (b) follow-up PET-CT, or (c) tissue sampling. This recommendation follows the consensus statement: Guidelines for Management of Incidental Pulmonary Nodules Detected on CT Images: From the Fleischner Society 2017; Radiology 2017; 284:228-243. 3. Bilateral adrenal gland nodules with benign appearance. No imaging follow-up is indicated. 4. Aortic atherosclerosis. 5. Multiple calcified uterine fibroids, unchanged. Electronically Signed   By: Elsie Gravely M.D.   On: 04/07/2024 21:55   DG Chest 2 View Result Date: 04/07/2024 CLINICAL  DATA:  Occasional chest pain. EXAM: CHEST - 2 VIEW COMPARISON:  March 31, 2024 FINDINGS: There is stable single lead ventricular pacer positioning. Multiple sternal wires are present. The cardiac silhouette is moderately enlarged and unchanged in size. An atrial appendage clip is in place. Mild, stable linear scarring and/or atelectasis is seen within the mid left lung. Mild atelectatic changes are also noted within the bilateral lung bases. No pleural effusion or pneumothorax is identified. Multilevel degenerative changes are seen throughout the thoracic spine. IMPRESSION: Stable cardiomegaly with mild bibasilar atelectasis. Electronically Signed   By: Suzen Dials M.D.   On: 04/07/2024 19:55    EKG: Independently reviewed. Atrial fibrillation, RAD, LVH.   Assessment/Plan   1. COPD exacerbation  - Culture sputum, continue Rocephin , start systemic steroid and short-acting bronchodilators    2. CAD  - No anginal complaints, mild/flat troponin elevation likely reflects demand ischemia in setting of acute respiratory illness    3. Atrial fibrillation  - Continue Xarelto     4. Chronic combined systolic & diastolic dysfunction  - EF was 25% on echo from June 2025  - She appears compensated  - Continue torsemide , Aldactone , and ARB, monitor weight and I/Os    5. Hypertension  - Hydralazine , losartan     6. CKD 3B  - Appears close to baseline  - Renally-dose medications   7. Lung nodule  - Noted on CT in ED  - Discussed with patient, outpatient follow-up advised    DVT prophylaxis: Xarelto   Code Status: Full Level of Care: Level of care: Telemetry Medical Family Communication: None present  Disposition Plan:  Patient is from: Home  Anticipated d/c is to: Home  Anticipated d/c date is: Possibly as early as 8/25 or 8/26 Patient currently: Pending improved respiratory status  Consults called: None  Admission status: Observation     Evalene GORMAN Sprinkles, MD [facility]  04/08/2024, 12:54 AM

## 2024-04-09 ENCOUNTER — Telehealth: Payer: Self-pay

## 2024-04-09 NOTE — Transitions of Care (Post Inpatient/ED Visit) (Unsigned)
   04/09/2024  Name: ABBE BULA MRN: [id number] DOB: Dec 14, 1945  Today's TOC FU Call Status: Today's TOC FU Call Status:: Unsuccessful Call (1st Attempt) Unsuccessful Call (1st Attempt) Date: 04/09/24  Attempted to reach the patient regarding the most recent Inpatient/ED visit.  Follow Up Plan: Additional outreach attempts will be made to reach the patient to complete the Transitions of Care (Post Inpatient/ED visit) call.   Signature Julian Lemmings, LPN [facility] Nurse Health Advisor Direct Dial [phone]

## 2024-04-10 NOTE — Telephone Encounter (Signed)
 Spoke w/ patient - she is scheduled to see Dr. Waddell on 10/8 for follow up. She refused to see EP APP for an earlier appt.

## 2024-04-10 NOTE — Transitions of Care (Post Inpatient/ED Visit) (Signed)
   04/10/2024  Name: MILEY BLANCHETT MRN: [id number] DOB: [birth year]  Today's TOC FU Call Status: Today's TOC FU Call Status:: Unsuccessful Call (1st Attempt) Unsuccessful Call (1st Attempt) Date: 04/09/24  Attempted to reach the patient regarding the most recent Inpatient/ED visit.  Follow Up Plan: No further outreach attempts will be made at this time. We have been unable to contact the patient. Patient already seen in office Signature Julian Lemmings, LPN [facility] Nurse Health Advisor Direct Dial [phone]

## 2024-04-11 ENCOUNTER — Encounter: Payer: Self-pay | Admitting: Family Medicine

## 2024-04-11 ENCOUNTER — Ambulatory Visit (INDEPENDENT_AMBULATORY_CARE_PROVIDER_SITE_OTHER): Admitting: Family Medicine

## 2024-04-11 VITALS — BP 130/82 | HR 60 | Temp 97.9°F | Ht 67.0 in | Wt 167.6 lb

## 2024-04-11 DIAGNOSIS — I4819 Other persistent atrial fibrillation: Secondary | ICD-10-CM | POA: Diagnosis not present

## 2024-04-11 DIAGNOSIS — I10 Essential (primary) hypertension: Secondary | ICD-10-CM

## 2024-04-11 DIAGNOSIS — Z95811 Presence of heart assist device: Secondary | ICD-10-CM

## 2024-04-11 DIAGNOSIS — J189 Pneumonia, unspecified organism: Secondary | ICD-10-CM | POA: Diagnosis not present

## 2024-04-11 DIAGNOSIS — E1169 Type 2 diabetes mellitus with other specified complication: Secondary | ICD-10-CM

## 2024-04-11 DIAGNOSIS — D696 Thrombocytopenia, unspecified: Secondary | ICD-10-CM | POA: Diagnosis not present

## 2024-04-11 DIAGNOSIS — N1832 Chronic kidney disease, stage 3b: Secondary | ICD-10-CM | POA: Diagnosis not present

## 2024-04-11 LAB — CBC WITH DIFFERENTIAL/PLATELET
Basophils Absolute: 0 K/uL (ref 0.0–0.1)
Basophils Relative: 0.5 % (ref 0.0–3.0)
Eosinophils Absolute: 0 K/uL (ref 0.0–0.7)
Eosinophils Relative: 0.3 % (ref 0.0–5.0)
HCT: 47.2 % — ABNORMAL HIGH (ref 36.0–46.0)
Hemoglobin: 15.4 g/dL — ABNORMAL HIGH (ref 12.0–15.0)
Lymphocytes Relative: 24 % (ref 12.0–46.0)
Lymphs Abs: 1.8 K/uL (ref 0.7–4.0)
MCHC: 32.6 g/dL (ref 30.0–36.0)
MCV: 91 fl (ref 78.0–100.0)
Monocytes Absolute: 0.5 K/uL (ref 0.1–1.0)
Monocytes Relative: 7.2 % (ref 3.0–12.0)
Neutro Abs: 5.1 K/uL (ref 1.4–7.7)
Neutrophils Relative %: 68 % (ref 43.0–77.0)
Platelets: 89 K/uL — ABNORMAL LOW (ref 150.0–400.0)
RBC: 5.18 Mil/uL — ABNORMAL HIGH (ref 3.87–5.11)
RDW: 16.9 % — ABNORMAL HIGH (ref 11.5–15.5)
WBC: 7.5 K/uL (ref 4.0–10.5)

## 2024-04-11 LAB — COMPREHENSIVE METABOLIC PANEL WITH GFR
ALT: 24 U/L (ref 0–35)
AST: 21 U/L (ref 0–37)
Albumin: 3.8 g/dL (ref 3.5–5.2)
Alkaline Phosphatase: 103 U/L (ref 39–117)
BUN: 33 mg/dL — ABNORMAL HIGH (ref 6–23)
CO2: 21 meq/L (ref 19–32)
Calcium: 9.3 mg/dL (ref 8.4–10.5)
Chloride: 109 meq/L (ref 96–112)
Creatinine, Ser: 1.3 mg/dL — ABNORMAL HIGH (ref 0.40–1.20)
GFR: 39.16 mL/min — ABNORMAL LOW (ref >60.00)
Glucose, Bld: 97 mg/dL (ref 70–99)
Potassium: 3.6 meq/L (ref 3.5–5.1)
Sodium: 142 meq/L (ref 135–145)
Total Bilirubin: 1.1 mg/dL (ref 0.2–1.2)
Total Protein: 6.8 g/dL (ref 6.0–8.3)

## 2024-04-11 NOTE — Progress Notes (Signed)
 Acute Office Visit  Subjective:     Patient ID: [name] [name], female    DOB: [birth year], 78 y.o.   MRN: [id number]  No chief complaint on file.   HPI  Discussed the use of AI scribe software for clinical note transcription with the patient, who gave verbal consent to proceed.  HFU from admission to [facility] 04/07/24-04/08/24 for SOB and pneumonia. TOC call attempted 04/09/24 History of Present Illness [name] [name] is a 78 year old female with a history of heart issues who presents with shortness of breath and medication management. HFU from [facility] hospital admission from 04/07/2024 until 04/08/2024 [facility] call on 04/09/2024.   Dyspnea - Intermittent shortness of breath persists following recent urgent care visit, was sent to the ED from urgent care - Received supplemental oxygen  during urgent care stay - Symptoms improved after completing a course of medication, but not fully resolved - No significant lower extremity swelling  Cardiac dysfunction - Recent decrease in cardiac function with ejection fraction dropping from 46% to 40% - History of heart issues  Medication management - Current medications include losartan , spironolactone , torsemide , and Xarelto  - Metformin  discontinued due to A1c of 5.6 - Issues with pharmacy refills, including receiving excess medication      ROS Per HPI      Objective:    BP 130/82 (BP Location: Left Arm, Patient Position: Sitting)   Pulse 60   Temp 97.9 F (36.6 C) (Temporal)   Ht 5' 7 (1.702 m)   Wt 167 lb 9.6 oz (76 kg)   SpO2 96%   BMI 26.25 kg/m    Physical Exam Vitals and nursing note reviewed.  Constitutional:      General: She is not in acute distress.    Appearance: Normal appearance.     Comments: Chronically ill-appearing  HENT:     Head: Normocephalic and atraumatic.     Right Ear: External ear normal.     Left Ear: External ear normal.     Nose: Nose normal.     Mouth/Throat:     Mouth: Mucous membranes  are moist.     Pharynx: Oropharynx is clear.  Eyes:     Extraocular Movements: Extraocular movements intact.     Pupils: Pupils are equal, round, and reactive to light.  Cardiovascular:     Rate and Rhythm: Normal rate and regular rhythm.     Pulses: Normal pulses.     Heart sounds: Normal heart sounds.  Pulmonary:     Effort: Pulmonary effort is normal. No respiratory distress.     Breath sounds: Normal breath sounds. No wheezing, rhonchi or rales.  Musculoskeletal:        General: Normal range of motion.     Cervical back: Normal range of motion.     Right lower leg: Edema present.     Left lower leg: Edema present.  Lymphadenopathy:     Cervical: No cervical adenopathy.  Neurological:     General: No focal deficit present.     Mental Status: She is alert and oriented to person, place, and time.  Psychiatric:        Mood and Affect: Mood normal.        Thought Content: Thought content normal.     Results for orders placed or performed in visit on 04/11/24  CBC with Differential/Platelet  Result Value Ref Range   WBC 7.5 4.0 - 10.5 K/uL   RBC 5.18 (H) 3.87 - 5.11 Mil/uL  Hemoglobin 15.4 (H) 12.0 - 15.0 g/dL   HCT 52.7 (H) 63.9 - 53.9 %   MCV 91.0 78.0 - 100.0 fl   MCHC 32.6 30.0 - 36.0 g/dL   RDW 83.0 (H) 88.4 - 84.4 %   Platelets 89.0 (L) 150.0 - 400.0 K/uL   Neutrophils Relative % 68.0 43.0 - 77.0 %   Lymphocytes Relative 24.0 12.0 - 46.0 %   Monocytes Relative 7.2 3.0 - 12.0 %   Eosinophils Relative 0.3 0.0 - 5.0 %   Basophils Relative 0.5 0.0 - 3.0 %   Neutro Abs 5.1 1.4 - 7.7 K/uL   Lymphs Abs 1.8 0.7 - 4.0 K/uL   Monocytes Absolute 0.5 0.1 - 1.0 K/uL   Eosinophils Absolute 0.0 0.0 - 0.7 K/uL   Basophils Absolute 0.0 0.0 - 0.1 K/uL  Comprehensive metabolic panel with GFR  Result Value Ref Range   Sodium 142 135 - 145 mEq/L   Potassium 3.6 3.5 - 5.1 mEq/L   Chloride 109 96 - 112 mEq/L   CO2 21 19 - 32 mEq/L   Glucose, Bld 97 70 - 99 mg/dL   BUN 33 (H) 6 -  23 mg/dL   Creatinine, Ser 8.69 (H) 0.40 - 1.20 mg/dL   Total Bilirubin 1.1 0.2 - 1.2 mg/dL   Alkaline Phosphatase 103 39 - 117 U/L   AST 21 0 - 37 U/L   ALT 24 0 - 35 U/L   Total Protein 6.8 6.0 - 8.3 g/dL   Albumin  3.8 3.5 - 5.2 g/dL   GFR 60.83 (L) >39.99 mL/min   Calcium  9.3 8.4 - 10.5 mg/dL        Assessment & Plan:   Assessment and Plan Assessment & Plan Chronic combined systolic and diastolic heart failure Troponin levels decreased from 46 to 40, indicating improvement. No significant dyspnea reported. Heart sounds well-managed. - Recheck labs today to monitor heart function and troponin levels.  Thrombocytopenia - Noted in hospital, will recheck CBC with differential today  Persistent atrial fibrillation, presence of heart assist device Managed with Xarelto  for anticoagulation. No acute issues identified.  Chronic kidney disease, stage 3b Discussed losartan 's protective effects on renal function. A1c stable at 5.6, not on metformin . - Continue losartan  for kidney protection. - Recheck labs today to monitor kidney function.  Type 2 diabetes mellitus, not currently requiring medication A1c at 5.6, well-controlled without medication. Previously on metformin , discontinued due to stable control. - Advise avoiding high-calorie drinks and sodas.  Essential hypertension Managed with losartan . Blood pressure control is crucial for renal and cardiovascular health. - Continue losartan  for blood pressure management.  Pneumonia, COPD - Illness resolving with antibiotic course - Discussed to complete course, follow-up in office if symptoms worsen or persist after completion of antibiotics     Orders Placed This Encounter  Procedures   CBC with Differential/Platelet    Release to patient:   Immediate [1]   Comprehensive metabolic panel with GFR    Release to patient:   Immediate [1]     No orders of the defined types were placed in this encounter.   Return if  symptoms worsen or fail to improve, for Specialist and PCP as scheduled.  [name], FNP

## 2024-04-16 ENCOUNTER — Ambulatory Visit (INDEPENDENT_AMBULATORY_CARE_PROVIDER_SITE_OTHER): Payer: Self-pay

## 2024-04-16 DIAGNOSIS — I5042 Chronic combined systolic (congestive) and diastolic (congestive) heart failure: Secondary | ICD-10-CM | POA: Diagnosis not present

## 2024-04-18 LAB — CUP PACEART REMOTE DEVICE CHECK
Date Time Interrogation Session: 20250902143957
Implantable Lead Connection Status: 753985
Implantable Lead Implant Date: 20220606
Implantable Lead Location: 753860
Implantable Lead Model: 377169
Implantable Lead Serial Number: 8000396500
Implantable Pulse Generator Implant Date: 20220606
Pulse Gen Model: 407145
Pulse Gen Serial Number: 70105656

## 2024-04-21 ENCOUNTER — Ambulatory Visit: Payer: Self-pay | Admitting: Internal Medicine

## 2024-04-23 ENCOUNTER — Ambulatory Visit: Payer: Self-pay | Admitting: Family Medicine

## 2024-04-24 NOTE — Progress Notes (Signed)
 Remote PPM Transmission

## 2024-04-24 NOTE — Patient Instructions (Signed)
 Continue out your course of antibiotics to completion.  Continue other current medication regimen  We want to recheck your CBC and metabolic panel today, will be in contact with any results that require further attention.  Follow-up with pulmonology as scheduled.  Follow-up cardiology as scheduled.  Follow-up with us  for any worsening symptoms of pneumonia, if they are persisting we can do a follow-up x-ray in about 3 to 4 weeks to ensure resolution of pneumonia.  Follow-up with primary care scheduled

## 2024-04-25 ENCOUNTER — Ambulatory Visit (HOSPITAL_COMMUNITY)
Admission: RE | Admit: 2024-04-25 | Discharge: 2024-04-25 | Disposition: A | Discharge status: Home / Self Care | Source: Ambulatory Visit | Attending: Internal Medicine | Admitting: Internal Medicine

## 2024-04-25 ENCOUNTER — Encounter (HOSPITAL_COMMUNITY): Payer: Self-pay | Admitting: Internal Medicine

## 2024-04-25 VITALS — BP 122/80 | HR 64 | Ht 67.5 in | Wt 169.2 lb

## 2024-04-25 DIAGNOSIS — I5042 Chronic combined systolic (congestive) and diastolic (congestive) heart failure: Secondary | ICD-10-CM | POA: Diagnosis not present

## 2024-04-25 DIAGNOSIS — Z950 Presence of cardiac pacemaker: Secondary | ICD-10-CM | POA: Diagnosis not present

## 2024-04-25 DIAGNOSIS — Z87440 Personal history of urinary (tract) infections: Secondary | ICD-10-CM | POA: Diagnosis not present

## 2024-04-25 DIAGNOSIS — I361 Nonrheumatic tricuspid (valve) insufficiency: Secondary | ICD-10-CM | POA: Diagnosis not present

## 2024-04-25 DIAGNOSIS — Z5982 Transportation insecurity: Secondary | ICD-10-CM | POA: Diagnosis not present

## 2024-04-25 DIAGNOSIS — Z91148 Patient's other noncompliance with medication regimen for other reason: Secondary | ICD-10-CM | POA: Insufficient documentation

## 2024-04-25 DIAGNOSIS — Z79899 Other long term (current) drug therapy: Secondary | ICD-10-CM | POA: Insufficient documentation

## 2024-04-25 DIAGNOSIS — E785 Hyperlipidemia, unspecified: Secondary | ICD-10-CM | POA: Insufficient documentation

## 2024-04-25 DIAGNOSIS — Z951 Presence of aortocoronary bypass graft: Secondary | ICD-10-CM | POA: Diagnosis not present

## 2024-04-25 DIAGNOSIS — M79672 Pain in left foot: Secondary | ICD-10-CM | POA: Diagnosis not present

## 2024-04-25 DIAGNOSIS — I4891 Unspecified atrial fibrillation: Secondary | ICD-10-CM | POA: Diagnosis not present

## 2024-04-25 DIAGNOSIS — Z7901 Long term (current) use of anticoagulants: Secondary | ICD-10-CM | POA: Diagnosis not present

## 2024-04-25 DIAGNOSIS — F17210 Nicotine dependence, cigarettes, uncomplicated: Secondary | ICD-10-CM | POA: Diagnosis not present

## 2024-04-25 DIAGNOSIS — I4821 Permanent atrial fibrillation: Secondary | ICD-10-CM | POA: Diagnosis not present

## 2024-04-25 DIAGNOSIS — J449 Chronic obstructive pulmonary disease, unspecified: Secondary | ICD-10-CM | POA: Insufficient documentation

## 2024-04-25 DIAGNOSIS — N1832 Chronic kidney disease, stage 3b: Secondary | ICD-10-CM | POA: Diagnosis not present

## 2024-04-25 DIAGNOSIS — I340 Nonrheumatic mitral (valve) insufficiency: Secondary | ICD-10-CM | POA: Insufficient documentation

## 2024-04-25 DIAGNOSIS — Z720 Tobacco use: Secondary | ICD-10-CM | POA: Diagnosis not present

## 2024-04-25 DIAGNOSIS — I2510 Atherosclerotic heart disease of native coronary artery without angina pectoris: Secondary | ICD-10-CM | POA: Diagnosis not present

## 2024-04-25 DIAGNOSIS — Z01810 Encounter for preprocedural cardiovascular examination: Secondary | ICD-10-CM

## 2024-04-25 DIAGNOSIS — I255 Ischemic cardiomyopathy: Secondary | ICD-10-CM | POA: Diagnosis not present

## 2024-04-25 DIAGNOSIS — I130 Hypertensive heart and chronic kidney disease with heart failure and stage 1 through stage 4 chronic kidney disease, or unspecified chronic kidney disease: Secondary | ICD-10-CM | POA: Insufficient documentation

## 2024-04-25 LAB — BASIC METABOLIC PANEL WITH GFR
Anion gap: 9 (ref 5–15)
BUN: 25 mg/dL — ABNORMAL HIGH (ref 8–23)
CO2: 19 mmol/L — ABNORMAL LOW (ref 22–32)
Calcium: 8.7 mg/dL — ABNORMAL LOW (ref 8.9–10.3)
Chloride: 112 mmol/L — ABNORMAL HIGH (ref 98–111)
Creatinine, Ser: 1.16 mg/dL — ABNORMAL HIGH (ref 0.44–1.00)
GFR, Estimated: 48 mL/min — ABNORMAL LOW (ref >60)
Glucose, Bld: 101 mg/dL — ABNORMAL HIGH (ref 70–99)
Potassium: 3.7 mmol/L (ref 3.5–5.1)
Sodium: 140 mmol/L (ref 135–145)

## 2024-04-25 LAB — BRAIN NATRIURETIC PEPTIDE: B Natriuretic Peptide: 577.7 pg/mL — ABNORMAL HIGH (ref 0.0–100.0)

## 2024-04-25 NOTE — Progress Notes (Signed)
 ReDS Vest / Clip - 04/25/24 1435       ReDS Vest / Clip   Station Marker C    Ruler Value 28    ReDS Value Range Low volume    ReDS Actual Value 35

## 2024-04-25 NOTE — Patient Instructions (Signed)
 Medication Changes:  No Changes In Medications at this time.   Lab Work:  Labs done today, your results will be available in MyChart, we will contact you for abnormal readings.  Follow-Up in: 1 months with APP as scheduled   At the [facility], you and your health needs are our priority. We have a designated team specialized in the treatment of Heart Failure. This Care Team includes your primary Heart Failure Specialized Cardiologist (physician), Advanced Practice Providers (APPs- Physician Assistants and Nurse Practitioners), and Pharmacist who all work together to provide you with the care you need, when you need it.   You may see any of the following providers on your designated Care Team at your next follow up:  Dr. Toribio Fuel Dr. Ezra Shuck Dr. Ria Commander Dr. Odis Brownie Greig Mosses, NP Caffie Shed, [place] [facility] [place], [place] Beckey Coe, NP [place] Lee, NP Tinnie Redman, PharmD   Please be sure to bring in all your medications bottles to every appointment.   Need to Contact Us :  If you have any questions or concerns before your next appointment please send us  a message through [place] or call our office at [phone].    TO LEAVE A MESSAGE FOR THE NURSE SELECT OPTION 2, PLEASE LEAVE A MESSAGE INCLUDING: YOUR NAME DATE OF BIRTH CALL BACK NUMBER REASON FOR CALL**this is important as we prioritize the call backs  YOU WILL RECEIVE A CALL BACK THE SAME DAY AS LONG AS YOU CALL BEFORE 4:00 PM

## 2024-04-25 NOTE — Progress Notes (Signed)
 [facility] Note  PCP: Joshua Debby CROME, MD Primary Cardiologist: Dr. Jeffrie  EP: Dr Waddell HF MD: Dr Cherrie   HPI: [name] [name] is a 78 y.o. female with h/o HTN, former tobacco abuse, permanent AF, CAD s/p CABG x 2 (LIMA to LAD and SVG to OM) in 2016, Chronic combined HF, Ischemic CM, HLD, and Biotronik PPM.   She was initially noted to be in atrial fibrillation in 2016 with a reduced LVEF, leading to ischemic workup with a cardiac cath showing severe multivessel CAD with LM dz -> CABG x2 utilizing LIMA to LAD, SVG to OM.   Admitted 1/19 for AF with RVR in setting of Influenza A. EP was consulted to discuss plans for Tikosyn vs. ablation vs. medication rate control however, due to her known medication noncompliance she was found to not be an ablation nor antiarrhythmic candidate   2019 back in Afib. She was started on amiodarone  & Entresto .  She underwent successful atrial flutter TEE/DCCV on 12/25/17.  Later went back in A fib.  Admitted 6/22 for AV node ablation and PPM due to uncontrolled heart rates with A fib. Underwent Biotronix single lead PPM. Toprol  XL and digoxin  stopped. Carvedilol  25 mg bid started. She was discharged 01/19/21.   Echo 07/30/21: EF 25-30% Moderate RV dysfunction. Severe biatrial enlargement. Severe central MR/TR.   Was seen at [facility] in 4/23 for second opinion. Restarted Entresto  24/26 bid. Did not tolerate due to CP. Agreed she might candidate for mTEER down the road.    Admitted 10/23 with a/c HF, worrisome for low output. Echo EF 20%, RV moderately down.  RHC showed low volume and CI 1.9 on milrinone , improved with small IVF bolus. Drips weaned and GDMT added back  EP attempted to upgrade her PPM to ICD on 04/08 but she was found to have occlusion of left subclavian vein and procedure was aborted. Saw Dr. Waddell 04/25 and offered lead extraction and insertion of BiV ICD with removal of pacing lead and insertion of CS  lead, left bundle area lead and ICD lead. She was not interested. She appeared volume overloaded and Torsemide  was increased.   Admitted 4/24 with a/c HF, 2/2 medical non-adherence. Diuresed with IV lasix . Echo EF 25%, RV moderately reduced, RVSP 55 mmHg, severe BAE, moderate MR. GDMT titrated, and she was discharged home, weight 171 lbs.  Echo 6/25 with EF 25%, mod LVH, mildly reduced RV, mod MR, sev TR. Likely in the setting of now persistent AF.    Admitted 8/25 with COPD exacerbation. Stil smoking at ime.   She returns today for heart failure follow up. Can do all ADLs but gets SOB easily. Mild edema. No CP. She reports compliance with meds except she skips torsemide  when she goes out.. Broke left foot last year and now they want to take a screw out due to pain .   ROS: All systems negative except as listed in HPI, PMH and Problem List.  SH:  Social History   Socioeconomic History   Marital status: Widowed    Spouse name: Not on file   Number of children: 1   Years of education: 11   Highest education level: Not on file  Occupational History   Occupation: RETIRED [employer]  Tobacco Use   Smoking status: Some Days    Current packs/day: 0.00  Average packs/day: 0.5 packs/day for 56.0 years (28.0 ttl pk-yrs)    Types: Cigarettes    Start date: 11/13/1965    Last attempt to quit: 11/13/2021    Years since quitting: 2.4   Smokeless tobacco: Never   Tobacco comments:    01/02/2024 Patient smokes about 7-8 cigarettes daily    smoking 2 cigarettes/day as of 10/23/20  Vaping Use   Vaping status: Never Used  Substance and Sexual Activity   Alcohol  use: No    Alcohol /week: 0.0 standard drinks of alcohol    Drug use: No   Sexual activity: Not Currently  Other Topics Concern   Not on file  Social History Narrative   Patient reports it being difficult to pay for everything due to outstanding medical bills from hospital stay last year but states she is able to keep up with  basic expenses.  Patient does have some concerns with her house's condition due to a water leak- had her roof repaired last month but has some leaking in the front which has affected her porch.  Patient owns her own car and is able to drive herself but has some concerns about the reliability of her car- has gotten taxi to the clinic for appointment in the past when her car broke down.   Social Drivers of Corporate investment banker Strain: Low Risk  (07/12/2023)   Overall Financial Resource Strain (CARDIA)    Difficulty of Paying Living Expenses: Not hard at all  Food Insecurity: Food Insecurity Present (03/06/2024)   Hunger Vital Sign    Worried About Running Out of Food in the Last Year: Sometimes true    Ran Out of Food in the Last Year: Sometimes true  Transportation Needs: Unmet Transportation Needs (03/06/2024)   PRAPARE - Administrator, Civil Service (Medical): Yes    Lack of Transportation (Non-Medical): Yes  Physical Activity: Inactive (07/12/2023)   Exercise Vital Sign    Days of Exercise per Week: 0 days    Minutes of Exercise per Session: 0 min  Stress: Stress Concern Present (07/12/2023)   [employer] of Occupational Health - Occupational Stress Questionnaire    Feeling of Stress : Very much  Social Connections: Unknown (03/06/2024)   Social Connection and Isolation Panel    Frequency of Communication with Friends and Family: Three times a week    Frequency of Social Gatherings with Friends and Family: Once a week    Attends Religious Services: 1 to 4 times per year    Active Member of [employer] or Organizations: Yes    Attends Banker Meetings: 1 to 4 times per year    Marital Status: Patient declined  Intimate Partner Violence: Not At Risk (03/06/2024)   Humiliation, Afraid, Rape, and Kick questionnaire    Fear of Current or Ex-Partner: No    Emotionally Abused: No    Physically Abused: No    Sexually Abused: No   FH:  Family History   Problem Relation Age of Onset   Cancer Mother        LYMPHOMA   Heart disease Father    Other Father        TB   CVA Sister    Prostate cancer Brother 40   Diabetes Brother    Past Medical History:  Diagnosis Date   (HFimpEF) heart failure with improved ejection fraction (HCC)    a. 07/2018 Echo: EF 30-35%; b. 11/2019 Echo: EF 55-60%, no rwma, Gr2 DD, Nl RV  size/fxn. Mild BAE. Mild MR/AI.   Arthritis    Asthma    Atopic dermatitis    CAD (coronary artery disease)    a. 2016 s/p CABG x 2 (LIMA->LAD, VG->OM); b. 08/2018 MV: EF 44%, no ischemia/infact.   Cardiac arrest (HCC) 10/2014   Cardiomyopathy, ischemic    a. 07/2018 Echo: EF 30-35%; 11/2019 Echo: EF 55-60%.   Carotid arterial disease (HCC)    a. 11/2019 Carotid U/S   CHF (congestive heart failure) (HCC)    CKD (chronic kidney disease), stage III (HCC)    COPD (chronic obstructive pulmonary disease) (HCC)    Diabetes mellitus without complication (HCC)    Dysrhythmia    Esophageal dilatation 2013   GERD (gastroesophageal reflux disease)    Glaucoma    Gout    Headache    Hearing loss    History of blood transfusion    Hypertension    Hypokalemia    Idiopathic angioedema    LGI bleed 08/06/2017   a. felt to be hemorrhoidal during that admission (no drop in Hgb).   Lower back pain    Paroxysmal atrial fibrillation (HCC)    a. Dx 2016-->h/o difficult to control rates (complicated by noncompliance), not felt to be a candidate for ablation or antiarrhythmic due to noncompliance; b. Recurrent AF 2021 - converted w/ IV dilt; c. CHA2DS2VASc = 7-->Xarelto .   Personal history of noncompliance with medical treatment, presenting hazards to health    Pneumonia    Prediabetes    Presence of permanent cardiac pacemaker    Right leg numbness    S/P CABG x 2 with clipping of LA appendage 11/14/2014   LIMA to LAD, SVG to OM, EVH via right thigh   Urine incontinence    Uterine fibroid    Current Outpatient Medications  Medication  Sig Dispense Refill   acetaminophen  (TYLENOL ) 500 MG tablet Take 500 mg by mouth as needed for mild pain (pain score 1-3) or moderate pain (pain score 4-6).     Albuterol -Budesonide  (AIRSUPRA ) 90-80 MCG/ACT AERO Inhale 2 puffs into the lungs every 4 (four) hours as needed. 10.7 g 1   Alcohol  Swabs  (ALCOHOL  PADS) 70 % PADS 1 Act by Does not apply route 2 (two) times daily. 100 each 3   allopurinol  (ZYLOPRIM ) 100 MG tablet Take 1 tablet (100 mg total) by mouth daily. NEEDS FOLLOW UP APPOINTMENT FOR MORE REFILLS 90 tablet 1   atorvastatin  (LIPITOR ) 10 MG tablet TAKE 1 TABLET BY MOUTH EVERYDAY AT BEDTIME 90 tablet 0   Blood Glucose Monitoring Suppl (ONE TOUCH ULTRA 2) w/Device KIT 1 Act by Does not apply route 2 (two) times daily. 1 kit 3   gabapentin  (NEURONTIN ) 100 MG capsule TAKE 1 CAPSULE BY MOUTH EVERYDAY AT BEDTIME 30 capsule 1   hydrALAZINE  (APRESOLINE ) 25 MG tablet Take 3 tablets (75 mg total) by mouth 3 (three) times daily. 270 tablet 11   losartan  (COZAAR ) 100 MG tablet Take 1 tablet (100 mg total) by mouth daily. 90 tablet 1   ondansetron  (ZOFRAN ) 4 MG tablet Take 1 tablet (4 mg total) by mouth every 8 (eight) hours as needed for nausea or vomiting. 20 tablet 0   ONETOUCH ULTRA test strip 1 each by Other route 2 (two) times daily. as directed 100 each 3   pantoprazole  (PROTONIX ) 40 MG tablet TAKE 1 TABLET BY MOUTH EVERY DAY 90 tablet 0   spironolactone  (ALDACTONE ) 25 MG tablet TAKE 1 TABLET (25 MG TOTAL) BY MOUTH DAILY. (Patient  taking differently: Take 25 mg by mouth every other day.) 90 tablet 3   sucralfate  (CARAFATE ) 1 g tablet Take 1 tablet (1 g total) by mouth 4 (four) times daily -  with meals and at bedtime. 120 tablet 0   torsemide  (DEMADEX ) 20 MG tablet Take 1 tablet (20 mg total) by mouth daily.     TRELEGY ELLIPTA  100-62.5-25 MCG/ACT AEPB INHALE 1 PUFF INTO THE LUNGS DAILY 60 each 2   XARELTO  15 MG TABS tablet TAKE 1 TABLET (15 MG TOTAL) BY MOUTH DAILY WITH SUPPER 30 tablet 8    No current facility-administered medications for this encounter.   BP 122/80   Pulse 64   Ht 5' 7.5 (1.715 m)   Wt 76.7 kg (169 lb 3.2 oz)   SpO2 97%   BMI 26.11 kg/m   Wt Readings from Last 3 Encounters:  04/25/24 76.7 kg (169 lb 3.2 oz)  04/11/24 76 kg (167 lb 9.6 oz)  04/07/24 74.8 kg (165 lb)   PHYSICAL EXAM: General:  Weak appearing. No resp difficulty HEENT: normal Neck: supple. JCP 10 Carotids 2+ bilat; no bruits. No lymphadenopathy or thryomegaly appreciated. Cor: PMI nondisplaced. Regular rate & rhythm. No rubs, gallops or murmurs. Lungs: clear Abdomen: soft, nontender, nondistended. No hepatosplenomegaly. No bruits or masses. Good bowel sounds. Extremities: no cyanosis, clubbing, rash, 1+ edema R>L + cool  Neuro: alert & orientedx3, cranial nerves grossly intact. moves all 4 extremities w/o difficulty. Affect pleasant  ASSESSMENT & PLAN: 1. Chronic Systolic Heart Failure - Suspect Combined ICM /NICM with previous A fib RVR. RV Pacing? - In 2021 EF improved 55-60% but over the last year EF has been down 25-30 % with severe MR/TR - Echo (10/23): EF 20%, moderate LVH, RV moderately reduced, RVSP 46 mmHg, moderate BAE, mild to moderate MR, moderate TR, - Echo (4/24): EF 25%, RV moderately reduced, RVSP 55 mmHg, severe BAE, moderate MR - Echo (8/24): EF 30-35% severe BAE, moderate MR - Echo (6/25) EF 25% severe MR, Severe TR, RV moderately reduced, PAP in 70s.  - NYHA III-IIIB confounded by COPD and ortho issues - Volume ok. ReDS 35% - continue torsemide  20 mg daily. Does not take when she goes out.  - continue spironolactone  25 mg daily. - continue losartan  100 mg daily (failed Entresto  with chest pain).  - increase hydralazine  to 75 mg tid, refuses Imdur  d/t headaches. - No SGLT2i due UTI/yeast infections.  - No beta blocker high burden of RV pacing on device checks - Would likely benefit from upgrade to CRT. Upgrade PPM to ICD aborted 8/24 d/t occlusion of left  subclavian vein. Dr. Waddell saw patient and offered lead extraction and insertion of CRT, which she declined. No change  2. MR - severe functional MR/TR, - not interested in Clip - and HF likely too advanced anyway   3. Permanent A fib  - Biotronik dual chamber PPM/AV nodal ablation 2022.  - Rate controlled.  - Continue Xarelto  15   4. CAD - s/p CABG x2 2016 (LIMA to LAD and SVG to OM)  - No s/s anigna - No ASA given AC - Continue statin.   5. COPD/tobacco abuse - Continues to smoke a few cigarettes per day - Discussed need for cessation  6. CKD IIIb - Baseline SCr 1.2-1.4 - Labs today  7. Pre-Operative CV Risk stratification:  - High risk for any procedure involving GA - Would only proceed if can be done under a block with gentle conscious sedation  and close hemodynamic momitoring    Toribio Fuel, MD  04/25/24

## 2024-05-03 ENCOUNTER — Other Ambulatory Visit: Payer: Self-pay | Admitting: Family Medicine

## 2024-05-16 DIAGNOSIS — S82841D Displaced bimalleolar fracture of right lower leg, subsequent encounter for closed fracture with routine healing: Secondary | ICD-10-CM | POA: Diagnosis not present

## 2024-05-22 ENCOUNTER — Ambulatory Visit: Attending: Internal Medicine | Admitting: Internal Medicine

## 2024-05-22 ENCOUNTER — Encounter: Payer: Self-pay | Admitting: Internal Medicine

## 2024-05-22 ENCOUNTER — Telehealth (HOSPITAL_COMMUNITY): Payer: Self-pay

## 2024-05-22 VITALS — BP 114/77 | HR 88 | Ht 67.5 in | Wt 166.4 lb

## 2024-05-22 DIAGNOSIS — I4819 Other persistent atrial fibrillation: Secondary | ICD-10-CM

## 2024-05-22 LAB — CUP PACEART INCLINIC DEVICE CHECK
Date Time Interrogation Session: 20251008124351
Implantable Lead Connection Status: 753985
Implantable Lead Implant Date: 20220606
Implantable Lead Location: 753860
Implantable Lead Model: 377169
Implantable Lead Serial Number: 8000396500
Implantable Pulse Generator Implant Date: 20220606
Pulse Gen Model: 407145
Pulse Gen Serial Number: 70105656

## 2024-05-22 NOTE — Progress Notes (Signed)
 HPI [name] returns for followup. She is a pleasant 78 yo woman with uncontrolled atrial fib and what was initially thought a tachy induced CM s/p PPM insertion and AV node ablation. She did not have improvement in her LV function despite rate control. She has had some return in her AV conduction. I tried to upgrade her to a biv ICD but she was found to have an occluded left subclavian vein. She has previously been offered extraction and biv upgrade. When I saw her last we increased her paced rate to 60 and she is a little better and her QRS is actually narrow when she comes in on her own whic she does about 50% of the time. No syncope. Repeat echo showed an EF of 25% and PA pressure in the 50's.      Allergies  Allergen Reactions   Bee Venom Anaphylaxis   Ivp Dye [Iodinated Contrast Media] Anaphylaxis   Ace Inhibitors Other (See Comments)    angioedema   Atenolol Other (See Comments)    severe headaches   Codeine Nausea And Vomiting   Entresto  [Sacubitril -Valsartan ] Other (See Comments)    Chest pain    Isosorbide  Other (See Comments)    Severe headaches   Shrimp [Shellfish Allergy] Swelling   Simvastatin Other (See Comments)    not sure   Jardiance [Empagliflozin] Other (See Comments)    Caused Boils    Penicillin G Rash and Dermatitis     Current Outpatient Medications  Medication Sig Dispense Refill   acetaminophen  (TYLENOL ) 500 MG tablet Take 500 mg by mouth as needed for mild pain (pain score 1-3) or moderate pain (pain score 4-6).     Albuterol -Budesonide  (AIRSUPRA ) 90-80 MCG/ACT AERO Inhale 2 puffs into the lungs every 4 (four) hours as needed. 10.7 g 1   Alcohol  Swabs  (ALCOHOL  PADS) 70 % PADS 1 Act by Does not apply route 2 (two) times daily. 100 each 3   allopurinol  (ZYLOPRIM ) 100 MG tablet Take 1 tablet (100 mg total) by mouth daily. NEEDS FOLLOW UP APPOINTMENT FOR MORE REFILLS 90 tablet 1   atorvastatin  (LIPITOR ) 10 MG tablet TAKE 1 TABLET BY MOUTH EVERYDAY AT  BEDTIME 90 tablet 0   Blood Glucose Monitoring Suppl (ONE TOUCH ULTRA 2) w/Device KIT 1 Act by Does not apply route 2 (two) times daily. 1 kit 3   gabapentin  (NEURONTIN ) 100 MG capsule TAKE 1 CAPSULE BY MOUTH EVERYDAY AT BEDTIME 30 capsule 1   hydrALAZINE  (APRESOLINE ) 25 MG tablet Take 3 tablets (75 mg total) by mouth 3 (three) times daily. 270 tablet 11   losartan  (COZAAR ) 100 MG tablet Take 1 tablet (100 mg total) by mouth daily. 90 tablet 1   ondansetron  (ZOFRAN ) 4 MG tablet Take 1 tablet (4 mg total) by mouth every 8 (eight) hours as needed for nausea or vomiting. 20 tablet 0   ONETOUCH ULTRA test strip 1 each by Other route 2 (two) times daily. as directed 100 each 3   pantoprazole  (PROTONIX ) 40 MG tablet TAKE 1 TABLET BY MOUTH EVERY DAY 90 tablet 0   spironolactone  (ALDACTONE ) 25 MG tablet TAKE 1 TABLET (25 MG TOTAL) BY MOUTH DAILY. (Patient taking differently: Take 25 mg by mouth every other day.) 90 tablet 3   sucralfate  (CARAFATE ) 1 g tablet Take 1 tablet (1 g total) by mouth 4 (four) times daily -  with meals and at bedtime. 120 tablet 0   torsemide  (DEMADEX ) 20 MG tablet Take 1  tablet (20 mg total) by mouth daily.     TRELEGY ELLIPTA  100-62.5-25 MCG/ACT AEPB INHALE 1 PUFF INTO THE LUNGS DAILY 60 each 2   XARELTO  15 MG TABS tablet TAKE 1 TABLET (15 MG TOTAL) BY MOUTH DAILY WITH SUPPER 30 tablet 8   No current facility-administered medications for this visit.     Past Medical History:  Diagnosis Date   (HFimpEF) heart failure with improved ejection fraction (HCC)    a. 07/2018 Echo: EF 30-35%; b. 11/2019 Echo: EF 55-60%, no rwma, Gr2 DD, Nl RV size/fxn. Mild BAE. Mild MR/AI.   Arthritis    Asthma    Atopic dermatitis    CAD (coronary artery disease)    a. 2016 s/p CABG x 2 (LIMA->LAD, VG->OM); b. 08/2018 MV: EF 44%, no ischemia/infact.   Cardiac arrest (HCC) 10/2014   Cardiomyopathy, ischemic    a. 07/2018 Echo: EF 30-35%; 11/2019 Echo: EF 55-60%.   Carotid arterial disease    a.  11/2019 Carotid U/S   CHF (congestive heart failure) (HCC)    CKD (chronic kidney disease), stage III (HCC)    COPD (chronic obstructive pulmonary disease) (HCC)    Diabetes mellitus without complication (HCC)    Dysrhythmia    Esophageal dilatation 2013   GERD (gastroesophageal reflux disease)    Glaucoma    Gout    Headache    Hearing loss    History of blood transfusion    Hypertension    Hypokalemia    Idiopathic angioedema    LGI bleed 08/06/2017   a. felt to be hemorrhoidal during that admission (no drop in Hgb).   Lower back pain    Paroxysmal atrial fibrillation (HCC)    a. Dx 2016-->h/o difficult to control rates (complicated by noncompliance), not felt to be a candidate for ablation or antiarrhythmic due to noncompliance; b. Recurrent AF 2021 - converted w/ IV dilt; c. CHA2DS2VASc = 7-->Xarelto .   Personal history of noncompliance with medical treatment, presenting hazards to health    Pneumonia    Prediabetes    Presence of permanent cardiac pacemaker    Right leg numbness    S/P CABG x 2 with clipping of LA appendage 11/14/2014   LIMA to LAD, SVG to OM, EVH via right thigh   Urine incontinence    Uterine fibroid     ROS:   All systems reviewed and negative except as noted in the HPI.   Past Surgical History:  Procedure Laterality Date   AV NODE ABLATION N/A 01/18/2021   Procedure: AV NODE ABLATION;  Surgeon: Waddell Danelle ORN, MD;  Location: [facility] INVASIVE CV LAB;  Service: Cardiovascular;  Laterality: N/A;   BALLOON DILATION N/A 10/10/2019   Procedure: BALLOON DILATION;  Surgeon: Elicia Claw, MD;  Location: [facility] ENDOSCOPY;  Service: Gastroenterology;  Laterality: N/A;   BIOPSY  10/10/2019   Procedure: BIOPSY;  Surgeon: Elicia Claw, MD;  Location: [facility] ENDOS[facility];  Service: Gastroenterology;;   BIOPSY  10/11/2019   Procedure: BIOPSY;  Surgeon: Elicia Claw, MD;  Location: [facility];  Service: Gastroenterology;;   JUVENTINO LLANO N/A 11/21/2022    Procedure: BIVI  ICD UPGRADE;  Surgeon: Waddell Danelle ORN, MD;  Location: [facility] INVASIVE CV LAB;  Service: Cardiovascular;  Laterality: N/A;   CARDIOVERSION N/A 11/18/2014   Procedure: CARDIOVERSION;  Surgeon: Vinie JAYSON Maxcy, MD;  Location: [facility] OR;  Service: Cardiovascular;  Laterality: N/A;   CARDIOVERSION N/A 12/25/2017   Procedure: CARDIOVERSION;  Surgeon: Cherrie Toribio SAUNDERS, MD;  Location: [facility]  ENDOSCOPY;  Service: Cardiovascular;  Laterality: N/A;   CLIPPING OF ATRIAL APPENDAGE N/A 11/14/2014   Procedure: CLIPPING OF ATRIAL APPENDAGE;  Surgeon: Sudie VEAR Laine, MD;  Location: [facility] OR;  Service: Open Heart Surgery;  Laterality: N/A;   COLONOSCOPY  2013   COLONOSCOPY WITH PROPOFOL  N/A 10/11/2019   Procedure: COLONOSCOPY WITH PROPOFOL ;  Surgeon: Elicia Claw, MD;  Location: [facility]OS[facility];  Service: Gastroenterology;  Laterality: N/A;   CORONARY ARTERY BYPASS GRAFT N/A 11/14/2014   Procedure: CORONARY ARTERY BYPASS GRAFTING (CABG)TIMES 2 USING LEFT INTERNAL MAMMARY ARTERY AND RIGHT SAPHENOUS VEIN HARVESTED ENDOSCOPICALLY;  Surgeon: Sudie VEAR Laine, MD;  Location: [facility] OR;  Service: Open Heart Surgery;  Laterality: N/A;   ESOPHAGOGASTRODUODENOSCOPY (EGD) WITH PROPOFOL  N/A 10/10/2019   Procedure: ESOPHAGOGASTRODUODENOSCOPY (EGD) WITH PROPOFOL ;  Surgeon: Elicia Claw, MD;  Location: [facility] ENDOS[facility];  Service: Gastroenterology;  Laterality: N/A;   ESOPHAGOGASTRODUODENOSCOPY (EGD) WITH PROPOFOL  N/A 10/27/2019   Procedure: ESOPHAGOGASTRODUODENOSCOPY (EGD) WITH PROPOFOL ;  Surgeon: Saintclair Jasper, MD;  Location: [facility] ENDOSCOPY;  Service: Gastroenterology;  Laterality: N/A;   GIVENS CAPSULE STUDY N/A 10/27/2019   Procedure: GIVENS CAPSULE STUDY;  Surgeon: Saintclair Jasper, MD;  Location: [facility] ENDOSCOPY;  Service: Gastroenterology;  Laterality: N/A;   INSERT / REPLACE / REMOVE PACEMAKER     LEFT HEART CATHETERIZATION WITH CORONARY ANGIOGRAM N/A 11/04/2014   Procedure: LEFT HEART CATHETERIZATION WITH CORONARY ANGIOGRAM;   Surgeon: Debby DELENA Sor, MD;  Location: [facility] CATH LAB;  Service: Cardiovascular;  Laterality: N/A;   ORIF ANKLE FRACTURE Right 06/26/2023   Procedure: OPEN REDUCTION INTERNAL FIXATION (ORIF) BIMALLEOLAR ANKLE FRACTURE;  Surgeon: Barton Drape, MD;  Location: [facility] ORS;  Service: Orthopedics;  Laterality: Right;   PACEMAKER IMPLANT N/A 01/18/2021   Procedure: PACEMAKER IMPLANT;  Surgeon: Waddell Danelle ORN, MD;  Location: [facility] LAB;  Service: Cardiovascular;  Laterality: N/A;   PACEMAKER IMPLANT Left    POLYPECTOMY  10/11/2019   Procedure: POLYPECTOMY;  Surgeon: Elicia Claw, MD;  Location: [facility] ENDOSCOPY;  Service: Gastroenterology;;   RIGHT HEART CATH N/A 06/08/2022   Procedure: RIGHT HEART CATH;  Surgeon: Cherrie Toribio SAUNDERS, MD;  Location: [facility] INVASIVE CV LAB;  Service: Cardiovascular;  Laterality: N/A;   TEE WITHOUT CARDIOVERSION N/A 11/14/2014   Procedure: TRANSESOPHAGEAL ECHOCARDIOGRAM (TEE);  Surgeon: Sudie VEAR Laine, MD;  Location: [facility] OR;  Service: Open Heart Surgery;  Laterality: N/A;   TEE WITHOUT CARDIOVERSION N/A 12/25/2017   Procedure: TRANSESOPHAGEAL ECHOCARDIOGRAM (TEE);  Surgeon: Cherrie Toribio SAUNDERS, MD;  Location: [facility] ENDOSCOPY;  Service: Cardiovascular;  Laterality: N/A;   TEMPORARY PACEMAKER INSERTION  11/04/2014   Procedure: TEMPORARY PACEMAKER INSERTION;  Surgeon: Debby DELENA Sor, MD;  Location: [facility] CATH LAB;  Service: Cardiovascular;;   TOOTH EXTRACTION       Family History  Problem Relation Age of Onset   Cancer Mother        LYMPHOMA   Heart disease Father    Other Father        TB   CVA Sister    Prostate cancer Brother 41   Diabetes Brother      Social History   Socioeconomic History   Marital status: Widowed    Spouse name: Not on file   Number of children: 1   Years of education: 85   Highest education level: Not on file  Occupational History   Occupation: RETIRED [employer]  Tobacco Use   Smoking status: Some Days    Current  packs/day: 0.00    Average packs/day: 0.5  packs/day for 56.0 years (28.0 ttl pk-yrs)    Types: Cigarettes    Start date: 11/13/1965    Last attempt to quit: 11/13/2021    Years since quitting: 2.5   Smokeless tobacco: Never   Tobacco comments:    01/02/2024 Patient smokes about 7-8 cigarettes daily    smoking 2 cigarettes/day as of 10/23/20  Vaping Use   Vaping status: Never Used  Substance and Sexual Activity   Alcohol  use: No    Alcohol /week: 0.0 standard drinks of alcohol    Drug use: No   Sexual activity: Not Currently  Other Topics Concern   Not on file  Social History Narrative   Patient reports it being difficult to pay for everything due to outstanding medical bills from hospital stay last year but states she is able to keep up with basic expenses.  Patient does have some concerns with her house's condition due to a water leak- had her roof repaired last month but has some leaking in the front which has affected her porch.  Patient owns her own car and is able to drive herself but has some concerns about the reliability of her car- has gotten taxi to the clinic for appointment in the past when her car broke down.   Social Drivers of Corporate investment banker Strain: Low Risk  (07/12/2023)   Overall Financial Resource Strain (CARDIA)    Difficulty of Paying Living Expenses: Not hard at all  Food Insecurity: Food Insecurity Present (03/06/2024)   Hunger Vital Sign    Worried About Running Out of Food in the Last Year: Sometimes true    Ran Out of Food in the Last Year: Sometimes true  Transportation Needs: Unmet Transportation Needs (03/06/2024)   PRAPARE - Administrator, Civil Service (Medical): Yes    Lack of Transportation (Non-Medical): Yes  Physical Activity: Inactive (07/12/2023)   Exercise Vital Sign    Days of Exercise per Week: 0 days    Minutes of Exercise per Session: 0 min  Stress: Stress Concern Present (07/12/2023)   [employer] of Occupational  Health - Occupational Stress Questionnaire    Feeling of Stress : Very much  Social Connections: Unknown (03/06/2024)   Social Connection and Isolation Panel    Frequency of Communication with Friends and Family: Three times a week    Frequency of Social Gatherings with Friends and Family: Once a week    Attends Religious Services: 1 to 4 times per year    Active Member of [employer] or Organizations: Yes    Attends Banker Meetings: 1 to 4 times per year    Marital Status: Patient declined  Intimate Partner Violence: Not At Risk (03/06/2024)   Humiliation, Afraid, Rape, and Kick questionnaire    Fear of Current or Ex-Partner: No    Emotionally Abused: No    Physically Abused: No    Sexually Abused: No     BP 114/77 (BP Location: Left Arm, Patient Position: Sitting, Cuff Size: Large)   Pulse 88   Ht 5' 7.5 (1.715 m)   Wt 166 lb 6.4 oz (75.5 kg)   SpO2 98%   BMI 25.68 kg/m   Physical Exam:  Well appearing NAD HEENT: Unremarkable Neck:  No JVD, no thyromegally Lymphatics:  No adenopathy Back:  No CVA tenderness Lungs:  Clear HEART:  Regular rate rhythm, no murmurs, no rubs, no clicks Abd:  soft, positive bowel sounds, no organomegally, no rebound, no guarding Ext:  2  plus pulses, no edema, no cyanosis, no clubbing Skin:  No rashes no nodules Neuro:  CN II through XII intact, motor grossly intact  EKG  DEVICE  Normal device function.  See PaceArt for details.   Assess/Plan: Perm atrial fib - her VR is controlled. No change in meds.  Chronic systolic heart failure - her symptoms are better. Class 2. Continue current meds.Her intrinsic QRS is not too wide. CHB - she has some conduction now and is pacing about 50%.  Coags - she will continue to take her xarelto .  5. Preop - there is consideration of removing the hardware in her right lower leg. She would be a moderate risk for general anesthesia. She could hold her xarelto  for 3 days prior to the procedure. The  half life is about 8 hours.   Danelle Tyteanna Ost,MD

## 2024-05-22 NOTE — Patient Instructions (Signed)
   Follow-Up: At [facility], you and your health needs are our priority.  As part of our continuing mission to provide you with exceptional heart care, our providers are all part of one team.  This team includes your primary Cardiologist (physician) and Advanced Practice Providers or APPs (Physician Assistants and Nurse Practitioners) who all work together to provide you with the care you need, when you need it.  Your next appointment:   12 month(s)  Provider:   DONNICE PRIMUS MD

## 2024-05-22 NOTE — Telephone Encounter (Signed)
 Called to confirm/remind patient of their appointment at the [facility] on 05/23/24.   Appointment:   [] Confirmed  [x] Left mess   [] No answer/No voice mail  [] VM Full/unable to leave message  [] Phone not in service  And to bring in all medications and/or complete list.

## 2024-05-23 ENCOUNTER — Encounter (HOSPITAL_COMMUNITY): Payer: Self-pay

## 2024-05-23 ENCOUNTER — Ambulatory Visit (HOSPITAL_COMMUNITY)
Admission: RE | Admit: 2024-05-23 | Discharge: 2024-05-23 | Disposition: A | Discharge status: Home / Self Care | Source: Ambulatory Visit | Attending: Physician Assistant | Admitting: Physician Assistant

## 2024-05-23 VITALS — BP 130/82 | HR 90 | Ht 67.0 in | Wt 162.6 lb

## 2024-05-23 DIAGNOSIS — F17210 Nicotine dependence, cigarettes, uncomplicated: Secondary | ICD-10-CM | POA: Insufficient documentation

## 2024-05-23 DIAGNOSIS — I2510 Atherosclerotic heart disease of native coronary artery without angina pectoris: Secondary | ICD-10-CM | POA: Diagnosis not present

## 2024-05-23 DIAGNOSIS — Z951 Presence of aortocoronary bypass graft: Secondary | ICD-10-CM | POA: Insufficient documentation

## 2024-05-23 DIAGNOSIS — I5042 Chronic combined systolic (congestive) and diastolic (congestive) heart failure: Secondary | ICD-10-CM | POA: Diagnosis not present

## 2024-05-23 DIAGNOSIS — E1122 Type 2 diabetes mellitus with diabetic chronic kidney disease: Secondary | ICD-10-CM | POA: Insufficient documentation

## 2024-05-23 DIAGNOSIS — I071 Rheumatic tricuspid insufficiency: Secondary | ICD-10-CM | POA: Insufficient documentation

## 2024-05-23 DIAGNOSIS — I4821 Permanent atrial fibrillation: Secondary | ICD-10-CM | POA: Diagnosis not present

## 2024-05-23 DIAGNOSIS — I255 Ischemic cardiomyopathy: Secondary | ICD-10-CM | POA: Diagnosis not present

## 2024-05-23 DIAGNOSIS — R109 Unspecified abdominal pain: Secondary | ICD-10-CM | POA: Diagnosis not present

## 2024-05-23 DIAGNOSIS — J4489 Other specified chronic obstructive pulmonary disease: Secondary | ICD-10-CM | POA: Diagnosis not present

## 2024-05-23 DIAGNOSIS — E785 Hyperlipidemia, unspecified: Secondary | ICD-10-CM | POA: Insufficient documentation

## 2024-05-23 DIAGNOSIS — I130 Hypertensive heart and chronic kidney disease with heart failure and stage 1 through stage 4 chronic kidney disease, or unspecified chronic kidney disease: Secondary | ICD-10-CM | POA: Insufficient documentation

## 2024-05-23 DIAGNOSIS — I340 Nonrheumatic mitral (valve) insufficiency: Secondary | ICD-10-CM | POA: Diagnosis not present

## 2024-05-23 DIAGNOSIS — I5022 Chronic systolic (congestive) heart failure: Secondary | ICD-10-CM | POA: Diagnosis not present

## 2024-05-23 DIAGNOSIS — Z79899 Other long term (current) drug therapy: Secondary | ICD-10-CM | POA: Diagnosis not present

## 2024-05-23 DIAGNOSIS — Z7901 Long term (current) use of anticoagulants: Secondary | ICD-10-CM | POA: Insufficient documentation

## 2024-05-23 DIAGNOSIS — N1832 Chronic kidney disease, stage 3b: Secondary | ICD-10-CM | POA: Diagnosis not present

## 2024-05-23 MED ORDER — HYDRALAZINE HCL 25 MG PO TABS: 50.0000 mg | ORAL_TABLET | Freq: Three times a day (TID) | ORAL | Status: AC

## 2024-05-23 NOTE — Progress Notes (Addendum)
 Advanced Heart Failure Clinic Note  PCP: Joshua Debby CROME, MD Primary Cardiologist: Dr. Jeffrie  EP: Dr Waddell HF MD: Dr Cherrie   HPI: [name] [name] is a 78 y.o. female with h/o HTN, former tobacco abuse, permanent AF, CAD s/p CABG x 2 (LIMA to LAD and SVG to OM) in 2016, Chronic combined HF, Ischemic CM, HLD, and Biotronik PPM.   She was initially noted to be in atrial fibrillation in 2016 with a reduced LVEF, leading to ischemic workup with a cardiac cath showing severe multivessel CAD with LM dz -> CABG x2 utilizing LIMA to LAD, SVG to OM.   Admitted 1/19 for AF with RVR in setting of Influenza A. EP was consulted to discuss plans for Tikosyn vs. ablation vs. medication rate control however, due to her known medication noncompliance she was found to not be an ablation nor antiarrhythmic candidate   2019 back in Afib. She was started on amiodarone  & Entresto .  She underwent successful atrial flutter TEE/DCCV on 12/25/17.  Later went back in A fib.  Admitted 6/22 for AV node ablation and PPM due to uncontrolled heart rates with A fib. Underwent Biotronix single lead PPM. Toprol  XL and digoxin  stopped. Carvedilol  25 mg bid started. She was discharged 01/19/21.   Echo 07/30/21: EF 25-30% Moderate RV dysfunction. Severe biatrial enlargement. Severe central MR/TR.   Was seen at [facility] in 4/23 for second opinion. Restarted Entresto  24/26 bid. Did not tolerate due to CP. Agreed she might candidate for mTEER down the road.    Admitted 10/23 with a/c HF, worrisome for low output. Echo EF 20%, RV moderately down.  RHC showed low volume and CI 1.9 on milrinone , improved with small IVF bolus. Drips weaned and GDMT added back  EP attempted to upgrade her PPM to ICD 4/24 but she was found to have occlusion of left subclavian vein and procedure was aborted. Saw Dr. Waddell and offered lead extraction and insertion of BiV ICD with removal of pacing lead and insertion of CS lead, left  bundle area lead and ICD lead. She was not interested.   Admitted 4/24 with a/c HF, 2/2 medical non-adherence. Diuresed with IV lasix . Echo EF 25%, RV moderately reduced, RVSP 55 mmHg, severe BAE, moderate MR. GDMT titrated, and she was discharged home, weight 171 lbs.  Echo 6/25 with EF 25%, mod LVH, mildly reduced RV, mod MR, sev TR. Likely in the setting of now persistent AF.    Admitted 8/25 with COPD exacerbation. Still smoking.   Here today for 1 month CHF follow-up. Reports her PCP recently treated her for PNA. Denies cough but notes chronic shortness of breath with light activity. No orthopnea or PND. Has chronic RLE edema after an orthopedic injury. Has been losing weight slowly, reports decreased appetite and sometimes gets abdominal pain after eating. Doesn't take lasix  day of appointments, usually takes in evening once she gets home. Cut back hydralazine  to 50 TID after last visit, she couldn't tolerate higher dose. Taking all other medications as prescribed.    ROS: All systems negative except as listed in HPI, PMH and Problem List.  SH:  Social History   Socioeconomic History   Marital status: Widowed    Spouse name: Not on file   Number of children: 1   Years of education: 44   Highest education level: Not on file  Occupational History   Occupation: RETIRED [employer]  Tobacco Use   Smoking status: Some Days    Current packs/day: 0.00    Average packs/day: 0.5 packs/day for 56.0 years (28.0 ttl pk-yrs)    Types: Cigarettes    Start date: 11/13/1965    Last attempt to quit: 11/13/2021    Years since quitting: 2.5   Smokeless tobacco: Never   Tobacco comments:    01/02/2024 Patient smokes about 7-8 cigarettes daily    smoking 2 cigarettes/day as of 10/23/20  Vaping Use   Vaping status: Never Used  Substance and Sexual Activity   Alcohol  use: No    Alcohol /week: 0.0 standard drinks of alcohol    Drug use: No   Sexual activity: Not Currently  Other Topics  Concern   Not on file  Social History Narrative   Patient reports it being difficult to pay for everything due to outstanding medical bills from hospital stay last year but states she is able to keep up with basic expenses.  Patient does have some concerns with her house's condition due to a water leak- had her roof repaired last month but has some leaking in the front which has affected her porch.  Patient owns her own car and is able to drive herself but has some concerns about the reliability of her car- has gotten taxi to the clinic for appointment in the past when her car broke down.   Social Drivers of Corporate investment banker Strain: Low Risk  (07/12/2023)   Overall Financial Resource Strain (CARDIA)    Difficulty of Paying Living Expenses: Not hard at all  Food Insecurity: Food Insecurity Present (03/06/2024)   Hunger Vital Sign    Worried About Running Out of Food in the Last Year: Sometimes true    Ran Out of Food in the Last Year: Sometimes true  Transportation Needs: Unmet Transportation Needs (03/06/2024)   PRAPARE - Administrator, Civil Service (Medical): Yes    Lack of Transportation (Non-Medical): Yes  Physical Activity: Inactive (07/12/2023)   Exercise Vital Sign    Days of Exercise per Week: 0 days    Minutes of Exercise per Session: 0 min  Stress: Stress Concern Present (07/12/2023)   [employer] of Occupational Health - Occupational Stress Questionnaire    Feeling of Stress : Very much  Social Connections: Unknown (03/06/2024)   Social Connection and Isolation Panel    Frequency of Communication with Friends and Family: Three times a week    Frequency of Social Gatherings with Friends and Family: Once a week    Attends Religious Services: 1 to 4 times per year    Active Member of [employer] or Organizations: Yes    Attends Banker Meetings: 1 to 4 times per year    Marital Status: Patient declined  Intimate Partner Violence: Not At Risk  (03/06/2024)   Humiliation, Afraid, Rape, and Kick questionnaire    Fear of Current or Ex-Partner: No    Emotionally Abused: No    Physically Abused: No    Sexually Abused: No   FH:  Family History  Problem Relation Age of Onset   Cancer Mother        LYMPHOMA   Heart disease Father    Other Father        TB   CVA Sister    Prostate cancer Brother 27   Diabetes Brother    Past Medical History:  Diagnosis Date   (  HFimpEF) heart failure with improved ejection fraction (HCC)    a. 07/2018 Echo: EF 30-35%; b. 11/2019 Echo: EF 55-60%, no rwma, Gr2 DD, Nl RV size/fxn. Mild BAE. Mild MR/AI.   Arthritis    Asthma    Atopic dermatitis    CAD (coronary artery disease)    a. 2016 s/p CABG x 2 (LIMA->LAD, VG->OM); b. 08/2018 MV: EF 44%, no ischemia/infact.   Cardiac arrest (HCC) 10/2014   Cardiomyopathy, ischemic    a. 07/2018 Echo: EF 30-35%; 11/2019 Echo: EF 55-60%.   Carotid arterial disease    a. 11/2019 Carotid U/S   CHF (congestive heart failure) (HCC)    CKD (chronic kidney disease), stage III (HCC)    COPD (chronic obstructive pulmonary disease) (HCC)    Diabetes mellitus without complication (HCC)    Dysrhythmia    Esophageal dilatation 2013   GERD (gastroesophageal reflux disease)    Glaucoma    Gout    Headache    Hearing loss    History of blood transfusion    Hypertension    Hypokalemia    Idiopathic angioedema    LGI bleed 08/06/2017   a. felt to be hemorrhoidal during that admission (no drop in Hgb).   Lower back pain    Paroxysmal atrial fibrillation (HCC)    a. Dx 2016-->h/o difficult to control rates (complicated by noncompliance), not felt to be a candidate for ablation or antiarrhythmic due to noncompliance; b. Recurrent AF 2021 - converted w/ IV dilt; c. CHA2DS2VASc = 7-->Xarelto .   Personal history of noncompliance with medical treatment, presenting hazards to health    Pneumonia    Prediabetes    Presence of permanent cardiac pacemaker    Right leg  numbness    S/P CABG x 2 with clipping of LA appendage 11/14/2014   LIMA to LAD, SVG to OM, EVH via right thigh   Urine incontinence    Uterine fibroid    Current Outpatient Medications  Medication Sig Dispense Refill   acetaminophen  (TYLENOL ) 500 MG tablet Take 500 mg by mouth as needed for mild pain (pain score 1-3) or moderate pain (pain score 4-6).     Albuterol -Budesonide  (AIRSUPRA ) 90-80 MCG/ACT AERO Inhale 2 puffs into the lungs every 4 (four) hours as needed. 10.7 g 1   Alcohol  Swabs  (ALCOHOL  PADS) 70 % PADS 1 Act by Does not apply route 2 (two) times daily. 100 each 3   allopurinol  (ZYLOPRIM ) 100 MG tablet Take 1 tablet (100 mg total) by mouth daily. NEEDS FOLLOW UP APPOINTMENT FOR MORE REFILLS 90 tablet 1   atorvastatin  (LIPITOR ) 10 MG tablet TAKE 1 TABLET BY MOUTH EVERYDAY AT BEDTIME 90 tablet 0   Blood Glucose Monitoring Suppl (ONE TOUCH ULTRA 2) w/Device KIT 1 Act by Does not apply route 2 (two) times daily. 1 kit 3   gabapentin  (NEURONTIN ) 100 MG capsule TAKE 1 CAPSULE BY MOUTH EVERYDAY AT BEDTIME 30 capsule 1   losartan  (COZAAR ) 100 MG tablet Take 1 tablet (100 mg total) by mouth daily. 90 tablet 1   ondansetron  (ZOFRAN ) 4 MG tablet Take 1 tablet (4 mg total) by mouth every 8 (eight) hours as needed for nausea or vomiting. 20 tablet 0   ONETOUCH ULTRA test strip 1 each by Other route 2 (two) times daily. as directed 100 each 3   pantoprazole  (PROTONIX ) 40 MG tablet TAKE 1 TABLET BY MOUTH EVERY DAY 90 tablet 0   spironolactone  (ALDACTONE ) 25 MG tablet TAKE 1 TABLET (25 MG TOTAL)  BY MOUTH DAILY. 90 tablet 3   sucralfate  (CARAFATE ) 1 g tablet Take 1 tablet (1 g total) by mouth 4 (four) times daily -  with meals and at bedtime. 120 tablet 0   torsemide  (DEMADEX ) 20 MG tablet Take 1 tablet (20 mg total) by mouth daily.     TRELEGY ELLIPTA  100-62.5-25 MCG/ACT AEPB INHALE 1 PUFF INTO THE LUNGS DAILY 60 each 2   XARELTO  15 MG TABS tablet TAKE 1 TABLET (15 MG TOTAL) BY MOUTH DAILY WITH  SUPPER 30 tablet 8   hydrALAZINE  (APRESOLINE ) 25 MG tablet Take 2 tablets (50 mg total) by mouth 3 (three) times daily.     No current facility-administered medications for this encounter.   BP 130/82   Pulse 90   Ht 5' 7 (1.702 m)   Wt 73.8 kg (162 lb 9.6 oz)   SpO2 96%   BMI 25.47 kg/m   Wt Readings from Last 3 Encounters:  05/23/24 73.8 kg (162 lb 9.6 oz)  05/22/24 75.5 kg (166 lb 6.4 oz)  04/25/24 76.7 kg (169 lb 3.2 oz)   PHYSICAL EXAM: General:  Chronically ill appearing elderly female. Arrived in wheelchair Cor: JVP to midneck w/ prominent v waves. Regular rate & rhythm. 2/6 HSM Lungs: clear Abdomen: soft, nontender, nondistended.  Extremities: 1+ RLE edema, lower extremities are cool Neuro: alert & orientedx3. Affect flat.   ASSESSMENT & PLAN: 1. Chronic Systolic Heart Failure - Suspect Combined ICM /NICM with previous A fib RVR. RV Pacing? - In 2021 EF improved 55-60% but EF has now been down 25-30 % last couple of years - Echo (10/23): EF 20%, moderate LVH, RV moderately reduced, RVSP 46 mmHg, moderate BAE, mild to moderate MR, moderate TR, - Echo (4/24): EF 25%, RV moderately reduced, RVSP 55 mmHg, severe BAE, moderate MR - Echo (8/24): EF 30-35% severe BAE, moderate MR - Echo (6/25) EF 25% RV moderately reduced, PAP in 70s, moderate MR, severe TR - NYHA IIIb, confounded by COPD and orthopedic issues - Volume okay on exam and by ReDS which is 30% - continue torsemide  20 mg daily. Does not take when she goes out.  - continue spironolactone  25 mg daily. - continue losartan  100 mg daily (failed Entresto  with chest pain).  - reduce hydralazine  to 50 mg TID (cannot tolerate higher dose d/t headache), refuses Imdur  d/t headaches. - No SGLT2i due UTI/yeast infections.  - No beta blocker high burden of RV pacing on device checks - Limited room to further titrate GDMT d/t intolerances - Labs from 04/25/24 reviewed: Scr 1.16, BNP 577 (near where she has been) - She is  not currently interested in cardiac rehab - Would likely benefit from upgrade to CRT. Upgrade PPM to ICD aborted 8/24 d/t occlusion of left subclavian vein. Dr. Waddell saw patient and offered lead extraction and insertion of CRT, which she declined.  - Appears to be nearing end-stage. Explained that her GI symptoms and weight loss may be d/t her HF. Not a candidate for advanced therapies. She is not interested in RHC or other invasive procedures  2. MR - severe functional MR/TR, - Not interested in considering mTEER, HF may be too advanced to derive any significant benefit   3. Permanent A fib  - Biotronik dual chamber PPM/AV nodal ablation 2022.  - Rate controlled - Continue Xarelto  15 mg daily   4. CAD - s/p CABG x2 2016 (LIMA to LAD and SVG to OM)  - no angina - No aspirin  with  need for anticoagulation - Continue statin   5. COPD/tobacco abuse - Continues to smoke a few cigarettes per day - Recommend complete cessation  6. CKD IIIb - Baseline SCr 1.2-1.4 - Scr stqable 1.16 on labs in 9/25   Follow-up: Dr. Cherrie as scheduled in December 2025    [facility], [place], [place]  05/23/24

## 2024-05-23 NOTE — Addendum Note (Signed)
 Encounter addended by: Colletta Manuelita Garre, PA-C on: 05/23/2024 1:32 PM  Actions taken: Clinical Note Signed

## 2024-05-23 NOTE — Patient Instructions (Addendum)
 Good to see you today!    Decrease hydralazine  to 50 mg  Three times a day  [facility] phone number [phone]  Your physician recommends that you schedule a follow-up appointment as scheduled  If you have any questions or concerns before your next appointment please send us  a message through [facility] or call our office at [phone].    TO LEAVE A MESSAGE FOR THE NURSE SELECT OPTION 2, PLEASE LEAVE A MESSAGE INCLUDING: YOUR NAME DATE OF BIRTH CALL BACK NUMBER REASON FOR CALL**this is important as we prioritize the call backs  YOU WILL RECEIVE A CALL BACK THE SAME DAY AS LONG AS YOU CALL BEFORE 4:00 PM At the Advanced Heart Failure Clinic, you and your health needs are our priority. As part of our continuing mission to provide you with exceptional heart care, we have created designated Provider Care Teams. These Care Teams include your primary Cardiologist (physician) and Advanced Practice Providers (APPs- Physician Assistants and Nurse Practitioners) who all work together to provide you with the care you need, when you need it.   You may see any of the following providers on your designated Care Team at your next follow up: Dr Toribio Fuel Dr Ezra Shuck Dr. Ria Commander Dr. Morene Brownie Amy Lenetta, NP Caffie Shed, [place] [facility] [place], [place] Beckey Coe, NP [place] Lee, NP Ellouise Class, NP Tinnie Redman, PharmD Jaun Bash, PharmD   Please be sure to bring in all your medications bottles to every appointment.    Thank you for choosing [facility]

## 2024-05-23 NOTE — Progress Notes (Signed)
 ReDS Vest / Clip - 05/23/24 1000       ReDS Vest / Clip   Station Marker C    Ruler Value 27    ReDS Value Range Low volume    ReDS Actual Value 30

## 2024-05-24 ENCOUNTER — Other Ambulatory Visit (HOSPITAL_COMMUNITY): Payer: Self-pay

## 2024-05-24 MED ORDER — SPIRONOLACTONE 25 MG PO TABS: 25.0000 mg | ORAL_TABLET | Freq: Every day | ORAL | 3 refills | Status: AC

## 2024-05-27 ENCOUNTER — Ambulatory Visit: Admitting: Internal Medicine

## 2024-05-29 ENCOUNTER — Ambulatory Visit: Admitting: Internal Medicine

## 2024-06-12 ENCOUNTER — Telehealth: Payer: Self-pay | Admitting: Pharmacist

## 2024-06-12 NOTE — Telephone Encounter (Signed)
 Pharmacy Quality Measure Review  This patient is appearing on a report for being at risk of failing the adherence measure for diabetes and hypertension (ACEi/ARB) medications this calendar year.   Medication: Losartan  100 mg Last fill date: 06/06/24 for 90 day supply  Medication: Metformin  Last fill date: 01/27/24 for 90 day supply  Losartan  filled but not yet picked up. Metformin  was requested for refill but denied due to refill not appropriate. Metformin  was discontinued 01/30/24 due to A1c 5.6%. No further action needed.   Darrelyn Drum, PharmD, BCPS, CPP Clinical Pharmacist Practitioner [facility] [phone]

## 2024-06-15 ENCOUNTER — Other Ambulatory Visit (HOSPITAL_COMMUNITY): Payer: Self-pay | Admitting: Internal Medicine

## 2024-07-12 ENCOUNTER — Emergency Department (HOSPITAL_COMMUNITY)

## 2024-07-12 ENCOUNTER — Encounter (HOSPITAL_COMMUNITY): Payer: Self-pay | Admitting: Emergency Medicine

## 2024-07-12 ENCOUNTER — Emergency Department (HOSPITAL_COMMUNITY)
Admission: EM | Admit: 2024-07-12 | Discharge: 2024-07-12 | Disposition: A | Discharge status: Home / Self Care | Attending: Emergency Medicine | Admitting: Emergency Medicine

## 2024-07-12 DIAGNOSIS — Z7901 Long term (current) use of anticoagulants: Secondary | ICD-10-CM | POA: Diagnosis not present

## 2024-07-12 DIAGNOSIS — R1010 Upper abdominal pain, unspecified: Secondary | ICD-10-CM | POA: Insufficient documentation

## 2024-07-12 DIAGNOSIS — R1310 Dysphagia, unspecified: Secondary | ICD-10-CM | POA: Insufficient documentation

## 2024-07-12 DIAGNOSIS — R079 Chest pain, unspecified: Secondary | ICD-10-CM | POA: Diagnosis not present

## 2024-07-12 LAB — CBC WITH DIFFERENTIAL/PLATELET
Abs Immature Granulocytes: 0.01 K/uL (ref 0.00–0.07)
Basophils Absolute: 0.1 K/uL (ref 0.0–0.1)
Basophils Relative: 1 %
Eosinophils Absolute: 0 K/uL (ref 0.0–0.5)
Eosinophils Relative: 1 %
HCT: 53.1 % — ABNORMAL HIGH (ref 36.0–46.0)
Hemoglobin: 16.8 g/dL — ABNORMAL HIGH (ref 12.0–15.0)
Immature Granulocytes: 0 %
Lymphocytes Relative: 27 %
Lymphs Abs: 1.6 K/uL (ref 0.7–4.0)
MCH: 29.2 pg (ref 26.0–34.0)
MCHC: 31.6 g/dL (ref 30.0–36.0)
MCV: 92.2 fL (ref 80.0–100.0)
Monocytes Absolute: 0.5 K/uL (ref 0.1–1.0)
Monocytes Relative: 8 %
Neutro Abs: 3.7 K/uL (ref 1.7–7.7)
Neutrophils Relative %: 63 %
Platelets: 88 K/uL — ABNORMAL LOW (ref 150–400)
RBC: 5.76 MIL/uL — ABNORMAL HIGH (ref 3.87–5.11)
RDW: 18.8 % — ABNORMAL HIGH (ref 11.5–15.5)
WBC: 5.8 K/uL (ref 4.0–10.5)
nRBC: 0 % (ref 0.0–0.2)

## 2024-07-12 LAB — COMPREHENSIVE METABOLIC PANEL WITH GFR
ALT: 15 U/L (ref 0–44)
AST: 31 U/L (ref 15–41)
Albumin: 3.9 g/dL (ref 3.5–5.0)
Alkaline Phosphatase: 144 U/L — ABNORMAL HIGH (ref 38–126)
Anion gap: 14 (ref 5–15)
BUN: 30 mg/dL — ABNORMAL HIGH (ref 8–23)
CO2: 20 mmol/L — ABNORMAL LOW (ref 22–32)
Calcium: 9.8 mg/dL (ref 8.9–10.3)
Chloride: 107 mmol/L (ref 98–111)
Creatinine, Ser: 1.25 mg/dL — ABNORMAL HIGH (ref 0.44–1.00)
GFR, Estimated: 44 mL/min — ABNORMAL LOW (ref >60)
Glucose, Bld: 73 mg/dL (ref 70–99)
Potassium: 4.9 mmol/L (ref 3.5–5.1)
Sodium: 141 mmol/L (ref 135–145)
Total Bilirubin: 1.5 mg/dL — ABNORMAL HIGH (ref 0.0–1.2)
Total Protein: 7.4 g/dL (ref 6.5–8.1)

## 2024-07-12 LAB — TROPONIN T, HIGH SENSITIVITY: Troponin T High Sensitivity: 30 ng/L — ABNORMAL HIGH (ref 0–19)

## 2024-07-12 MED ORDER — LIDOCAINE VISCOUS HCL 2 % MT SOLN
15.0000 mL | Freq: Once | OROMUCOSAL | Status: AC
  Administered 2024-07-12: 15 mL via ORAL
  Filled 2024-07-12: qty 15

## 2024-07-12 MED ORDER — SUCRALFATE 1 G PO TABS: 1.0000 g | ORAL_TABLET | Freq: Three times a day (TID) | ORAL | 0 refills | Status: AC

## 2024-07-12 MED ORDER — ALUM & MAG HYDROXIDE-SIMETH 200-200-20 MG/5ML PO SUSP
30.0000 mL | Freq: Once | ORAL | Status: AC
  Administered 2024-07-12: 30 mL via ORAL
  Filled 2024-07-12: qty 30

## 2024-07-12 MED ORDER — PANTOPRAZOLE SODIUM 40 MG PO TBEC: 40.0000 mg | DELAYED_RELEASE_TABLET | Freq: Every day | ORAL | 0 refills | Status: AC

## 2024-07-12 NOTE — ED Triage Notes (Signed)
 Pt arriving POV for painful swallowing. Pt says it hurts when she eats and at times it feels like her food gets stuck sometimes.

## 2024-07-12 NOTE — ED Notes (Signed)
 Pt left without vitals because pt was ready to go home

## 2024-07-12 NOTE — ED Provider Notes (Signed)
 [place] EMERGENCY DEPARTMENT AT [facility] Provider Note   CSN: [id number] Arrival date & time: 07/12/24  1516     Patient presents with: Dysphagia   [name] [name] is a 78 y.o. female.  She is here with a complaint of burning pain in her chest and upper abdomen and difficulty swallowing food that worsened yesterday while eating turkey.  It is still there today but is a little better.  She is able to swallow liquids today.  She said this has been an ongoing problem for months.  Has seen GI in the past and had her esophagus stretched.  She is not really sure what meds she is on but she is on a lot.  She said she has seen her doctor before for this and they never seem to do anything.  {Add pertinent medical, surgical, social history, OB history to YEP:67052} The history is provided by the patient.       Prior to Admission medications   Medication Sig Start Date End Date Taking? Authorizing Provider  acetaminophen  (TYLENOL ) 500 MG tablet Take 500 mg by mouth as needed for mild pain (pain score 1-3) or moderate pain (pain score 4-6).    [provider]  Albuterol -Budesonide  (AIRSUPRA ) 90-80 MCG/ACT AERO Inhale 2 puffs into the lungs every 4 (four) hours as needed. 01/10/24   Sagardia, Miguel Jose, MD  Alcohol  Swabs  (ALCOHOL  PADS) 70 % PADS 1 Act by Does not apply route 2 (two) times daily. 04/28/23   Joshua Debby CROME, MD  allopurinol  (ZYLOPRIM ) 100 MG tablet Take 1 tablet (100 mg total) by mouth daily. 06/17/24   Bensimhon, Toribio SAUNDERS, MD  atorvastatin  (LIPITOR ) 10 MG tablet TAKE 1 TABLET BY MOUTH EVERYDAY AT BEDTIME 07/22/23   Joshua Debby CROME, MD  Blood Glucose Monitoring Suppl (ONE TOUCH ULTRA 2) w/Device KIT 1 Act by Does not apply route 2 (two) times daily. 04/28/23   Joshua Debby CROME, MD  gabapentin  (NEURONTIN ) 100 MG capsule TAKE 1 CAPSULE BY MOUTH EVERYDAY AT BEDTIME 03/22/24   Gershon Donnice SAUNDERS, DPM  hydrALAZINE  (APRESOLINE ) 25 MG tablet Take 2 tablets (50 mg total) by  mouth 3 (three) times daily. 05/23/24   Colletta Manuelita Garre, PA-C  losartan  (COZAAR ) 100 MG tablet Take 1 tablet (100 mg total) by mouth daily. 12/22/23   Alvia Corean CROME, FNP  ondansetron  (ZOFRAN ) 4 MG tablet Take 1 tablet (4 mg total) by mouth every 8 (eight) hours as needed for nausea or vomiting. 03/06/24   Danford, Lonni SQUIBB, MD  [facility] ULTRA test strip 1 each by Other route 2 (two) times daily. as directed 04/28/23   Joshua Debby CROME, MD  pantoprazole  (PROTONIX ) 40 MG tablet TAKE 1 TABLET BY MOUTH EVERY DAY 01/23/24   Joshua Debby CROME, MD  spironolactone  (ALDACTONE ) 25 MG tablet Take 1 tablet (25 mg total) by mouth daily. 05/24/24   Bensimhon, Toribio SAUNDERS, MD  sucralfate  (CARAFATE ) 1 g tablet Take 1 tablet (1 g total) by mouth 4 (four) times daily -  with meals and at bedtime. 03/31/24   Scott, Rocky SAILOR, PA-C  torsemide  (DEMADEX ) 20 MG tablet Take 1 tablet (20 mg total) by mouth daily. 04/08/24   Cheryle Page, MD  TRELEGY ELLIPTA  100-62.5-25 MCG/ACT AEPB INHALE 1 PUFF INTO THE LUNGS DAILY 01/24/24   Joshua Debby CROME, MD  XARELTO  15 MG TABS tablet TAKE 1 TABLET (15 MG TOTAL) BY MOUTH DAILY WITH SUPPER 01/09/24   Bensimhon, Toribio SAUNDERS, MD  losartan  (COZAAR )  50 MG tablet Take 1 tablet (50 mg total) by mouth daily. 12/16/22   Bensimhon, Toribio SAUNDERS, MD    Allergies: Bee venom, Ivp dye [iodinated contrast media], Ace inhibitors, Atenolol, Codeine, Entresto  [sacubitril -valsartan ], Isosorbide , Shrimp [shellfish allergy], Simvastatin, Jardiance [empagliflozin], and Penicillin g    Review of Systems  Constitutional:  Negative for fever.  HENT:  Positive for trouble swallowing. Negative for sore throat.   Respiratory:  Negative for shortness of breath.   Cardiovascular:  Positive for chest pain.  Gastrointestinal:  Negative for abdominal pain, nausea and vomiting.  Genitourinary:  Negative for dysuria.    Updated Vital Signs BP (!) 139/96 (BP Location: Left Arm)   Pulse 77   Temp 98.6 F (37 C) (Oral)    Resp 16   SpO2 99%   Physical Exam Vitals and nursing note reviewed.  Constitutional:      General: She is not in acute distress.    Appearance: Normal appearance. She is well-developed.  HENT:     Head: Normocephalic and atraumatic.  Eyes:     Conjunctiva/sclera: Conjunctivae normal.  Cardiovascular:     Rate and Rhythm: Normal rate and regular rhythm.     Heart sounds: No murmur heard. Pulmonary:     Effort: Pulmonary effort is normal. No respiratory distress.     Breath sounds: Normal breath sounds. No stridor. No wheezing.  Abdominal:     Palpations: Abdomen is soft.     Tenderness: There is no abdominal tenderness. There is no guarding or rebound.  Musculoskeletal:        General: No deformity.     Cervical back: Neck supple.  Skin:    General: Skin is warm and dry.  Neurological:     General: No focal deficit present.     Mental Status: She is alert.     GCS: GCS eye subscore is 4. GCS verbal subscore is 5. GCS motor subscore is 6.     (all labs ordered are listed, but only abnormal results are displayed) Labs Reviewed - No data to display  EKG: None  Radiology: No results found.  {Document cardiac monitor, telemetry assessment procedure when appropriate:32947} Procedures   Medications Ordered in the ED  alum & mag hydroxide-simeth (MAALOX/MYLANTA) 200-200-20 MG/5ML suspension 30 mL (has no administration in time range)    And  lidocaine  (XYLOCAINE ) 2 % viscous mouth solution 15 mL (has no administration in time range)    Clinical Course as of 07/12/24 1722  Fri Jul 12, 2024  1613 On review of notes she seen Dr. Elicia and had esophageal dilation done about 4 years ago. [MB]  1636 Chest x-ray with cardiomegaly no gross infiltrate.  Pacemaker.  No significant change from prior August 2025.  Awaiting radiology reading. [MB]    Clinical Course User Index [MB] Towana Ozell BROCKS, MD   {Click here for ABCD2, HEART and other calculators REFRESH Note  before signing:1}                              Medical Decision Making Amount and/or Complexity of Data Reviewed Labs: ordered. Radiology: ordered.  Risk OTC drugs. Prescription drug management.   This patient complains of ***; this involves an extensive number of treatment Options and is a complaint that carries with it a high risk of complications and morbidity. The differential includes ***  I ordered, reviewed and interpreted labs, which included *** I ordered medication *** and  reviewed PMP when indicated. I ordered imaging studies which included *** and I independently    visualized and interpreted imaging which showed *** Additional history obtained from *** Previous records obtained and reviewed *** I consulted *** and discussed lab and imaging findings and discussed disposition.  Cardiac monitoring reviewed, *** Social determinants considered, *** Critical Interventions: ***  After the interventions stated above, I reevaluated the patient and found *** Admission and further testing considered, ***   {Document critical care time when appropriate  Document review of labs and clinical decision tools ie CHADS2VASC2, etc  Document your independent review of radiology images and any outside records  Document your discussion with family members, caretakers and with consultants  Document social determinants of health affecting pt's care  Document your decision making why or why not admission, treatments were needed:32947:::1}   Final diagnoses:  None    ED Discharge Orders     None

## 2024-07-15 ENCOUNTER — Other Ambulatory Visit: Payer: Self-pay | Admitting: Cardiology

## 2024-07-15 DIAGNOSIS — I2510 Atherosclerotic heart disease of native coronary artery without angina pectoris: Secondary | ICD-10-CM

## 2024-07-15 DIAGNOSIS — I5022 Chronic systolic (congestive) heart failure: Secondary | ICD-10-CM

## 2024-07-15 MED ORDER — TORSEMIDE 20 MG PO TABS: 20.0000 mg | ORAL_TABLET | Freq: Every day | ORAL | 3 refills | Status: AC

## 2024-07-16 ENCOUNTER — Ambulatory Visit: Payer: Self-pay

## 2024-07-16 DIAGNOSIS — I5042 Chronic combined systolic (congestive) and diastolic (congestive) heart failure: Secondary | ICD-10-CM | POA: Diagnosis not present

## 2024-07-17 LAB — CUP PACEART REMOTE DEVICE CHECK
Battery Voltage: 70
Date Time Interrogation Session: 20251202080935
Implantable Lead Connection Status: 753985
Implantable Lead Implant Date: 20220606
Implantable Lead Location: 753860
Implantable Lead Model: 377169
Implantable Lead Serial Number: 8000396500
Implantable Pulse Generator Implant Date: 20220606
Pulse Gen Model: 407145
Pulse Gen Serial Number: 70105656

## 2024-07-19 ENCOUNTER — Ambulatory Visit: Payer: Self-pay | Admitting: Internal Medicine

## 2024-07-19 ENCOUNTER — Ambulatory Visit

## 2024-07-19 ENCOUNTER — Telehealth: Payer: Self-pay

## 2024-07-19 NOTE — Telephone Encounter (Signed)
 This is a duplicate phone call. I have forwarded the other telephone call to [facility]

## 2024-07-19 NOTE — Telephone Encounter (Signed)
 Copied from CRM [id number]. Topic: General - Other >> Jul 19, 2024 12:58 PM Franky GRADE wrote: Reason for CRM: Patient is returning a call they received from the office, noticed patient was scheduled for her AWV today at 12:30 pm. Called [facility] and was advised to send a CRM.  Please call Ms.Hilliker at [phone].

## 2024-07-19 NOTE — Telephone Encounter (Signed)
 Please advise

## 2024-07-19 NOTE — Progress Notes (Signed)
 Remote PPM Transmission

## 2024-07-19 NOTE — Telephone Encounter (Signed)
 Copied from CRM [id number]. Topic: Appointments - Appointment Scheduling >> Jul 19, 2024  1:09 PM Shereese L wrote:  Patient tried to reschedule awv appt. She needs a call back to reshcedule for her AWV because the only avail appt are for 07/2025 due to the appt currently saying arrived. Please call patient to schedule awv

## 2024-07-22 ENCOUNTER — Ambulatory Visit

## 2024-07-22 ENCOUNTER — Inpatient Hospital Stay (HOSPITAL_COMMUNITY): Admission: RE | Admit: 2024-07-22 | Source: Ambulatory Visit | Admitting: Internal Medicine

## 2024-07-22 VITALS — Ht 67.5 in | Wt 162.0 lb

## 2024-07-22 DIAGNOSIS — Z0000 Encounter for general adult medical examination without abnormal findings: Secondary | ICD-10-CM | POA: Diagnosis not present

## 2024-07-22 DIAGNOSIS — H9193 Unspecified hearing loss, bilateral: Secondary | ICD-10-CM | POA: Diagnosis not present

## 2024-07-22 NOTE — Patient Instructions (Signed)
 [name] [name],  Thank you for taking the time for your Medicare Wellness Visit. I appreciate your continued commitment to your health goals. Please review the care plan we discussed, and feel free to reach out if I can assist you further.  Please note that Annual Wellness Visits do not include a physical exam. Some assessments may be limited, especially if the visit was conducted virtually. If needed, we may recommend an in-person follow-up with your provider.  Ongoing Care Seeing your primary care provider every 3 to 6 months helps us  monitor your health and provide consistent, personalized care.   Referrals If a referral was made during today's visit and you haven't received any updates within two weeks, please contact the referred provider directly to check on the status.  Recommended Screenings:  Health Maintenance  Topic Date Due   Eye exam for diabetics  Never done   Hepatitis C Screening  Never done   Zoster (Shingles) Vaccine (1 of 2) Never done   Osteoporosis screening with Bone Density Scan  Never done   COVID-19 Vaccine (2 - Pfizer risk series) 03/25/2020   Flu Shot  03/15/2024   Hemoglobin A1C  07/31/2024   Yearly kidney health urinalysis for diabetes  01/29/2025   Complete foot exam   01/29/2025   Screening for Lung Cancer  04/07/2025   DTaP/Tdap/Td vaccine (5 - Tdap) 04/12/2025   Yearly kidney function blood test for diabetes  07/12/2025   Medicare Annual Wellness Visit  07/22/2025   Pneumococcal Vaccine for age over 9  Completed   Meningitis B Vaccine  Aged Out   Breast Cancer Screening  Discontinued   Colon Cancer Screening  Discontinued       07/22/2024    8:55 AM  Advanced Directives  Does Patient Have a Medical Advance Directive? Yes  Type of Estate Agent of [place];Living will  Does patient want to make changes to medical advance directive? Yes (Inpatient - patient requests chaplain consult to change a medical advance directive)  Copy  of Healthcare Power of Attorney in Chart? No - copy requested    Vision: Annual vision screenings are recommended for early detection of glaucoma, cataracts, and diabetic retinopathy. These exams can also reveal signs of chronic conditions such as diabetes and high blood pressure.  Dental: Annual dental screenings help detect early signs of oral cancer, gum disease, and other conditions linked to overall health, including heart disease and diabetes.

## 2024-07-22 NOTE — Progress Notes (Signed)
 Chief Complaint  Patient presents with   Medicare Wellness     Subjective:   [name] [name] is a 78 y.o. female who presents for a Medicare Annual Wellness Visit.  I connected with  [name] [name] on 07/22/24 by a audio enabled telemedicine application and verified that I am speaking with the correct person using two identifiers.  Patient Location: Home  Provider Location: Office/Clinic  Persons Participating in Visit: Patient.  I discussed the limitations of evaluation and management by telemedicine. The patient expressed understanding and agreed to proceed.  Vital Signs: Because this visit was a virtual/telehealth visit, some criteria may be missing or patient reported. Any vitals not documented were not able to be obtained and vitals that have been documented are patient reported.  Visit info / Clinical Intake: Medicare Wellness Visit Type:: Subsequent Annual Wellness Visit Persons participating in visit and providing information:: patient Medicare Wellness Visit Mode:: Telephone If telephone:: video declined Since this visit was completed virtually, some vitals may be partially provided or unavailable. Missing vitals are due to the limitations of the virtual format.: Documented vitals are patient reported If Telephone or Video please confirm:: I connected with patient using audio/video enable telemedicine. I verified patient identity with two identifiers, discussed telehealth limitations, and patient agreed to proceed. Patient Location:: home Provider Location:: office Interpreter Needed?: No Pre-visit prep was completed: no AWV questionnaire completed by patient prior to visit?: no Living arrangements:: with family/others Patient's Overall Health Status Rating: (!) fair Typical amount of pain: none Does pain affect daily life?: no Are you currently prescribed opioids?: no  Dietary Habits and Nutritional Risks How many meals a day?: 3 Most meals are obtained by: having  others provide food; preparing own meals In the last 2 weeks, have you had any of the following?: none Diabetic:: (!) yes Any non-healing wounds?: no How often do you check your BS?: as needed Would you like to be referred to a Nutritionist or for Diabetic Management? : no  Functional Status Activities of Daily Living (to include ambulation/medication): Independent Ambulation: Independent with device- listed below Home Assistive Devices/Equipment: Cane; Dentures (specify type) Medication Administration: Independent Home Management (perform basic housework or laundry): Independent Manage your own finances?: yes Primary transportation is: driving; family / friends Concerns about vision?: no *vision screening is required for WTM* Concerns about hearing?: no  Fall Screening Falls in the past year?: 0 Number of falls in past year: 0 Was there an injury with Fall?: 0 Fall Risk Category Calculator: 0 Patient Fall Risk Level: Low Fall Risk  Fall Risk Patient at Risk for Falls Due to: Impaired balance/gait Fall risk Follow up: Falls evaluation completed; Falls prevention discussed  Home and Transportation Safety: All rugs have non-skid backing?: N/A, no rugs All stairs or steps have railings?: N/A, no stairs Grab bars in the bathtub or shower?: (!) no Have non-skid surface in bathtub or shower?: yes Good home lighting?: yes Regular seat belt use?: yes Hospital stays in the last year:: (!) yes How many hospital stays:: 1 Reason: possible food poisoning/upset stomach  Cognitive Assessment Difficulty concentrating, remembering, or making decisions? : no Will 6CIT or Mini Cog be Completed: yes What year is it?: 0 points What month is it?: 0 points Give patient an address phrase to remember (5 components): [address] [place], [place] About what time is it?: 0 points Count backwards from 20 to 1: 0 points Say the months of the year in reverse: 4 points (Jan-Aug) Repeat the  address  phrase from earlier: 0 points 6 CIT Score: 4 points  Advance Directives (For Healthcare) Does Patient Have a Medical Advance Directive?: Yes Does patient want to make changes to medical advance directive?: Yes (Inpatient - patient requests chaplain consult to change a medical advance directive) Type of Advance Directive: Healthcare Power of [place]; Living will Copy of Healthcare Power of Attorney in Chart?: No - copy requested Copy of Living Will in Chart?: No - copy requested  Reviewed/Updated  Reviewed/Updated: Reviewed All (Medical, Surgical, Family, Medications, Allergies, Care Teams, Patient Goals)    Allergies (verified) Bee venom, Ivp dye [iodinated contrast media], Ace inhibitors, Atenolol, Codeine, Entresto  [sacubitril -valsartan ], Isosorbide , Shrimp [shellfish allergy], Simvastatin, Jardiance [empagliflozin], and Penicillin g   Current Medications (verified) Outpatient Encounter Medications as of 07/22/2024  Medication Sig   acetaminophen  (TYLENOL ) 500 MG tablet Take 500 mg by mouth as needed for mild pain (pain score 1-3) or moderate pain (pain score 4-6).   Albuterol -Budesonide  (AIRSUPRA ) 90-80 MCG/ACT AERO Inhale 2 puffs into the lungs every 4 (four) hours as needed.   Alcohol  Swabs  (ALCOHOL  PADS) 70 % PADS 1 Act by Does not apply route 2 (two) times daily.   allopurinol  (ZYLOPRIM ) 100 MG tablet Take 1 tablet (100 mg total) by mouth daily.   atorvastatin  (LIPITOR ) 10 MG tablet TAKE 1 TABLET BY MOUTH EVERYDAY AT BEDTIME   Blood Glucose Monitoring Suppl (ONE TOUCH ULTRA 2) w/Device KIT 1 Act by Does not apply route 2 (two) times daily.   gabapentin  (NEURONTIN ) 100 MG capsule TAKE 1 CAPSULE BY MOUTH EVERYDAY AT BEDTIME   hydrALAZINE  (APRESOLINE ) 25 MG tablet Take 2 tablets (50 mg total) by mouth 3 (three) times daily.   losartan  (COZAAR ) 100 MG tablet Take 1 tablet (100 mg total) by mouth daily.   ondansetron  (ZOFRAN ) 4 MG tablet Take 1 tablet (4 mg total) by mouth every 8  (eight) hours as needed for nausea or vomiting.   ONETOUCH ULTRA test strip 1 each by Other route 2 (two) times daily. as directed   pantoprazole  (PROTONIX ) 40 MG tablet Take 1 tablet (40 mg total) by mouth daily.   spironolactone  (ALDACTONE ) 25 MG tablet Take 1 tablet (25 mg total) by mouth daily.   sucralfate  (CARAFATE ) 1 g tablet Take 1 tablet (1 g total) by mouth 4 (four) times daily -  with meals and at bedtime.   torsemide  (DEMADEX ) 20 MG tablet Take 1 tablet (20 mg total) by mouth daily.   TRELEGY ELLIPTA  100-62.5-25 MCG/ACT AEPB INHALE 1 PUFF INTO THE LUNGS DAILY   XARELTO  15 MG TABS tablet TAKE 1 TABLET (15 MG TOTAL) BY MOUTH DAILY WITH SUPPER   [DISCONTINUED] losartan  (COZAAR ) 50 MG tablet Take 1 tablet (50 mg total) by mouth daily.   No facility-administered encounter medications on file as of 07/22/2024.    History: Past Medical History:  Diagnosis Date   (HFimpEF) heart failure with improved ejection fraction (HCC)    a. 07/2018 Echo: EF 30-35%; b. 11/2019 Echo: EF 55-60%, no rwma, Gr2 DD, Nl RV size/fxn. Mild BAE. Mild MR/AI.   Arthritis    Asthma    Atopic dermatitis    CAD (coronary artery disease)    a. 2016 s/p CABG x 2 (LIMA->LAD, VG->OM); b. 08/2018 MV: EF 44%, no ischemia/infact.   Cardiac arrest (HCC) 10/2014   Cardiomyopathy, ischemic    a. 07/2018 Echo: EF 30-35%; 11/2019 Echo: EF 55-60%.   Carotid arterial disease    a. 11/2019 Carotid U/S  CHF (congestive heart failure) (HCC)    CKD (chronic kidney disease), stage III (HCC)    COPD (chronic obstructive pulmonary disease) (HCC)    Diabetes mellitus without complication (HCC)    Dysrhythmia    Esophageal dilatation 2013   GERD (gastroesophageal reflux disease)    Glaucoma    Gout    Headache    Hearing loss    History of blood transfusion    Hypertension    Hypokalemia    Idiopathic angioedema    LGI bleed 08/06/2017   a. felt to be hemorrhoidal during that admission (no drop in Hgb).   Lower back pain     Paroxysmal atrial fibrillation (HCC)    a. Dx 2016-->h/o difficult to control rates (complicated by noncompliance), not felt to be a candidate for ablation or antiarrhythmic due to noncompliance; b. Recurrent AF 2021 - converted w/ IV dilt; c. CHA2DS2VASc = 7-->Xarelto .   Personal history of noncompliance with medical treatment, presenting hazards to health    Pneumonia    Prediabetes    Presence of permanent cardiac pacemaker    Right leg numbness    S/P CABG x 2 with clipping of LA appendage 11/14/2014   LIMA to LAD, SVG to OM, EVH via right thigh   Urine incontinence    Uterine fibroid    Past Surgical History:  Procedure Laterality Date   AV NODE ABLATION N/A 01/18/2021   Procedure: AV NODE ABLATION;  Surgeon: Waddell Danelle ORN, MD;  Location: [facility] INVASIVE CV LAB;  Service: Cardiovascular;  Laterality: N/A;   BALLOON DILATION N/A 10/10/2019   Procedure: BALLOON DILATION;  Surgeon: Elicia Claw, MD;  Location: [facility] ENDOSCOPY;  Service: Gastroenterology;  Laterality: N/A;   BIOPSY  10/10/2019   Procedure: BIOPSY;  Surgeon: Elicia Claw, MD;  Location: [facility] ENDOS[facility];  Service: Gastroenterology;;   BIOPSY  10/11/2019   Procedure: BIOPSY;  Surgeon: Elicia Claw, MD;  Location: [facility]OS[facility];  Service: Gastroenterology;;   JUVENTINO LLANO N/A 11/21/2022   Procedure: BIVI  ICD UPGRADE;  Surgeon: Waddell Danelle ORN, MD;  Location[facility] INVASIVE CV LAB;  Service: Cardiovascular;  Laterality: N/A;   CARDIOVERSION N/A 11/18/2014   Procedure: CARDIOVERSION;  Surgeon: Vinie JAYSON Maxcy, MD;  Location: [facility] OR;  Service: Cardiovascular;  Laterality: N/A;   CARDIOVERSION N/A 12/25/2017   Procedure: CARDIOVERSION;  Surgeon: Cherrie Toribio SAUNDERS, MD;  Location: [facility] ENDOSCOPY;  Service: Cardiovascular;  Laterality: N/A;   CLIPPING OF ATRIAL APPENDAGE N/A 11/14/2014   Procedure: CLIPPING OF ATRIAL APPENDAGE;  Surgeon: Sudie VEAR Laine, MD;  Location: [facility] OR;  Service: Open Heart Surgery;  Laterality: N/A;    COLONOSCOPY  2013   COLONOSCOPY WITH PROPOFOL  N/A 10/11/2019   Procedure: COLONOSCOPY WITH PROPOFOL ;  Surgeon: Elicia Claw, MD;  Location: [facility]OS[facility];  Service: Gastroenterology;  Laterality: N/A;   CORONARY ARTERY BYPASS GRAFT N/A 11/14/2014   Procedure: CORONARY ARTERY BYPASS GRAFTING (CABG)TIMES 2 USING LEFT INTERNAL MAMMARY ARTERY AND RIGHT SAPHENOUS VEIN HARVESTED ENDOSCOPICALLY;  Surgeon: Sudie VEAR Laine, MD;  Location: [facility] OR;  Service: Open Heart Surgery;  Laterality: N/A;   ESOPHAGOGASTRODUODENOSCOPY (EGD) WITH PROPOFOL  N/A 10/10/2019   Procedure: ESOPHAGOGASTRODUODENOSCOPY (EGD) WITH PROPOFOL ;  Surgeon: Elicia Claw, MD;  Location: [facility]OS[facility];  Service: Gastroenterology;  Laterality: N/A;   ESOPHAGOGASTRODUODENOSCOPY (EGD) WITH PROPOFOL  N/A 10/27/2019   Procedure: ESOPHAGOGASTRODUODENOSCOPY (EGD) WITH PROPOFOL ;  Surgeon: Saintclair Jasper, MD;  Location: [facility] ENDOSCOPY;  Service: Gastroenterology;  Laterality: N/A;   GIVENS CAPSULE STUDY N/A 10/27/2019   Procedure: GIVENS  CAPSULE STUDY;  Surgeon: Saintclair Jasper, MD;  Location: [facility] ENDOSCOPY;  Service: Gastroenterology;  Laterality: N/A;   INSERT / REPLACE / REMOVE PACEMAKER     LEFT HEART CATHETERIZATION WITH CORONARY ANGIOGRAM N/A 11/04/2014   Procedure: LEFT HEART CATHETERIZATION WITH CORONARY ANGIOGRAM;  Surgeon: Debby DELENA Sor, MD;  Location: [facility] CATH LAB;  Service: Cardiovascular;  Laterality: N/A;   ORIF ANKLE FRACTURE Right 06/26/2023   Procedure: OPEN REDUCTION INTERNAL FIXATION (ORIF) BIMALLEOLAR ANKLE FRACTURE;  Surgeon: Barton Drape, MD;  Location: [facility] ORS;  Service: Orthopedics;  Laterality: Right;   PACEMAKER IMPLANT N/A 01/18/2021   Procedure: PACEMAKER IMPLANT;  Surgeon: Waddell Danelle ORN, MD;  [facility]: [facility] INVAS[facility] CV LAB;  Service: Cardiovascular;  Laterality: N/A;   PACEMAKER IMPLANT Left    POLYPECTOMY  10/11/2019   Procedure: POLYPECTOMY;  Surgeon: Elicia Claw, MD;  Location: [facility] ENDOSCOPY;  Service:  Gastroenterology;;   RIGHT HEART CATH N/A 06/08/2022   Procedure: RIGHT HEART CATH;  Surgeon: Cherrie Toribio SAUNDERS, MD;  Location: [facility] INVASIVE CV LAB;  Service: Cardiovascular;  Laterality: N/A;   TEE WITHOUT CARDIOVERSION N/A 11/14/2014   Procedure: TRANSESOPHAGEAL ECHOCARDIOGRAM (TEE);  Surgeon: Sudie VEAR Laine, MD;  Location: [facility] OR;  Service: Open Heart Surgery;  Laterality: N/A;   TEE WITHOUT CARDIOVERSION N/A 12/25/2017   Procedure: TRANSESOPHAGEAL ECHOCARDIOGRAM (TEE);  Surgeon: Cherrie Toribio SAUNDERS, MD;  Location: [facility] ENDOSCOPY;  Service: Cardiovascular;  Laterality: N/A;   TEMPORARY PACEMAKER INSERTION  11/04/2014   Procedure: TEMPORARY PACEMAKER INSERTION;  Surgeon: Debby DELENA Sor, MD;  Location: [facility] CATH LAB;  Service: Cardiovascular;;   TOOTH EXTRACTION     Family History  Problem Relation Age of Onset   Cancer Mother        LYMPHOMA   Heart disease Father    Other Father        TB   CVA Sister    Prostate cancer Brother 72   Diabetes Brother    Social History   Occupational History   Occupation: RETIRED [employer]  Tobacco Use   Smoking status: Some Days    Current packs/day: 0.50    Average packs/day: 0.5 packs/day for 58.7 years (29.3 ttl pk-yrs)    Types: Cigarettes    Start date: 11/13/1965   Smokeless tobacco: Never   Tobacco comments:    01/02/2024 Patient smokes about 7-8 cigarettes daily    smoking 2 cigarettes/day as of 10/23/20  Vaping Use   Vaping status: Never Used  Substance and Sexual Activity   Alcohol  use: No    Alcohol /week: 0.0 standard drinks of alcohol    Drug use: No   Sexual activity: Not Currently   Tobacco Counseling Ready to quit: No Counseling given: Yes Tobacco comments: 01/02/2024 Patient smokes about 7-8 cigarettes daily smoking 2 cigarettes/day as of 10/23/20  SDOH Screenings   Food Insecurity: No Food Insecurity (07/22/2024)  Housing: Unknown (07/22/2024)  Transportation Needs: No Transportation Needs (07/22/2024)   Utilities: Not At Risk (07/22/2024)  Alcohol  Screen: Low Risk  (07/12/2023)  Depression (PHQ2-9): Low Risk  (07/22/2024)  Financial Resource Strain: Low Risk  (07/12/2023)  Physical Activity: Inactive (07/22/2024)  Social Connections: Moderately Integrated (07/22/2024)  Stress: Stress Concern Present (07/22/2024)  Tobacco Use: High Risk (07/22/2024)  Health Literacy: Adequate Health Literacy (07/22/2024)   See flowsheets for full screening details  Depression Screen PHQ 2 & 9 Depression Scale- Over the past 2 weeks, how often have you been bothered by any of the following problems? Little interest or pleasure in  doing things: 0 Feeling down, depressed, or hopeless (PHQ Adolescent also includes...irritable): 0 PHQ-2 Total Score: 0 Trouble falling or staying asleep, or sleeping too much: 0 Feeling tired or having little energy: 0 Poor appetite or overeating (PHQ Adolescent also includes...weight loss): 0 Feeling bad about yourself - or that you are a failure or have let yourself or your family down: 0 Trouble concentrating on things, such as reading the newspaper or watching television (PHQ Adolescent also includes...like school work): 0 Moving or speaking so slowly that other people could have noticed. Or the opposite - being so fidgety or restless that you have been moving around a lot more than usual: 3 (due to foot issue) Thoughts that you would be better off dead, or of hurting yourself in some way: 0 PHQ-9 Total Score: 3 If you checked off any problems, how difficult have these problems made it for you to do your work, take care of things at home, or get along with other people?: Not difficult at all  Depression Treatment Depression Interventions/Treatment : EYV7-0 Score <4 Follow-up Not Indicated     Goals Addressed               This Visit's Progress     Patient Stated (pt-stated)        Patient stated she plans to continue taking meds daily and do more walking              Objective:    Today's Vitals   07/22/24 0849  Weight: 162 lb (73.5 kg)  Height: 5' 7.5 (1.715 m)   Body mass index is 25 kg/m.  Hearing/Vision screen Hearing Screening - Comments:: Referral to an Audiology Vision Screening - Comments:: Wears 1 eye contact in right eye - due for an appt (plans to schedule appt Immunizations and Health Maintenance Health Maintenance  Topic Date Due   OPHTHALMOLOGY EXAM  Never done   Hepatitis C Screening  Never done   Zoster Vaccines- Shingrix (1 of 2) Never done   Bone Density Scan  Never done   COVID-19 Vaccine (2 - Pfizer risk series) 03/25/2020   Influenza Vaccine  03/15/2024   HEMOGLOBIN A1C  07/31/2024   Diabetic kidney evaluation - Urine ACR  01/29/2025   FOOT EXAM  01/29/2025   Lung Cancer Screening  04/07/2025   DTaP/Tdap/Td (5 - Tdap) 04/12/2025   Diabetic kidney evaluation - eGFR measurement  07/12/2025   Medicare Annual Wellness (AWV)  07/22/2025   Pneumococcal Vaccine: 50+ Years  Completed   Meningococcal B Vaccine  Aged Out   Mammogram  Discontinued   Colonoscopy  Discontinued        Assessment/Plan:  This is a routine wellness examination for [place].  Referral to [facility] Audiology - pt c/o difficulty hearing  Ophthalmology status: Referral to [facility] Ophthalmology   Patient Care Team: Joshua Debby CROME, MD as PCP - General (Internal Medicine) Jeffrie Oneil BROCKS, MD as PCP - Cardiology (Cardiology) Waddell Danelle ORN, MD as PCP - Electrophysiology (Cardiology) Bensimhon, Toribio SAUNDERS, MD as PCP - Advanced Heart Failure (Cardiology) Mona Vinie BROCKS, MD as Consulting Physician (Cardiology)  I have personally reviewed and noted the following in the patient's chart:   Medical and social history Use of alcohol , tobacco or illicit drugs  Current medications and supplements including opioid prescriptions. Functional ability and status Nutritional status Physical activity Advanced directives List of other  physicians Hospitalizations, surgeries, and ER visits in previous 12 months Vitals Screenings to include cognitive, depression, and  falls Referrals and appointments  Orders Placed This Encounter  Procedures   Ambulatory referral to Audiology    Referral Priority:   Routine    Referral Type:   Audiology Exam    Referral Reason:   Specialty Services Required    Referred to Provider:   Helane Darryle LABOR, AUD    Number of Visits Requested:   1   In addition, I have reviewed and discussed with patient certain preventive protocols, quality metrics, and best practice recommendations. A written personalized care plan for preventive services as well as general preventive health recommendations were provided to patient.   Verdie CHRISTELLA Saba, CMA   07/22/2024   Return in 1 year (on 07/22/2025).  After Visit Summary: (Declined) Due to this being a telephonic visit, with patients personalized plan was offered to patient but patient Declined AVS at this time   Nurse Notes: scheduled a CPE w/PCP for 08/2024.

## 2024-07-25 ENCOUNTER — Telehealth (HOSPITAL_COMMUNITY): Payer: Self-pay

## 2024-07-25 ENCOUNTER — Ambulatory Visit (HOSPITAL_COMMUNITY)

## 2024-07-25 NOTE — Telephone Encounter (Signed)
 Called to confirm/remind patient of their appointment at [facility] on 07/25/24.   Appointment:   [] Confirmed  [] Left mess   [] No answer/No voice mail  [x] VM Full/unable to leave message  [] Phone not in service  Patient reminded to bring all medications and/or complete list.  Confirmed patient has transportation. Gave directions, instructed to utilize valet parking.

## 2024-07-25 NOTE — Progress Notes (Incomplete)
 Advanced Heart Failure Clinic Note  PCP: Joshua Debby CROME, MD Primary Cardiologist: Dr. Jeffrie  EP: Dr Waddell HF MD: Dr Cherrie   HPI: [name] [name] is a 78 y.o. female with h/o HTN, former tobacco abuse, permanent AF, CAD s/p CABG x 2 (LIMA to LAD and SVG to OM) in 2016, Chronic combined HF, Ischemic CM, HLD, and Biotronik PPM.   She was initially noted to be in atrial fibrillation in 2016 with a reduced LVEF, leading to ischemic workup with a cardiac cath showing severe multivessel CAD with LM dz -> CABG x2 utilizing LIMA to LAD, SVG to OM.   Admitted 1/19 for AF with RVR in setting of Influenza A. EP was consulted to discuss plans for Tikosyn vs. ablation vs. medication rate control however, due to her known medication noncompliance she was found to not be an ablation nor antiarrhythmic candidate   2019 back in Afib. She was started on amiodarone  & Entresto .  She underwent successful atrial flutter TEE/DCCV on 12/25/17.  Later went back in A fib.  Admitted 6/22 for AV node ablation and PPM due to uncontrolled heart rates with A fib. Underwent Biotronix single lead PPM. Toprol  XL and digoxin  stopped. Carvedilol  25 mg bid started. She was discharged 01/19/21.   Echo 07/30/21: EF 25-30% Moderate RV dysfunction. Severe biatrial enlargement. Severe central MR/TR.   Was seen at [facility] in 4/23 for second opinion. Restarted Entresto  24/26 bid. Did not tolerate due to CP. Agreed she might candidate for mTEER down the road.    Admitted 10/23 with a/c HF, worrisome for low output. Echo EF 20%, RV moderately down.  RHC showed low volume and CI 1.9 on milrinone , improved with small IVF bolus. Drips weaned and GDMT added back  EP attempted to upgrade her PPM to ICD 4/24 but she was found to have occlusion of left subclavian vein and procedure was aborted. Saw Dr. Waddell and offered lead extraction and insertion of BiV ICD with removal of pacing lead and insertion of CS lead, left  bundle area lead and ICD lead. She was not interested.   Admitted 4/24 with a/c HF, 2/2 medical non-adherence. Diuresed with IV lasix . Echo EF 25%, RV moderately reduced, RVSP 55 mmHg, severe BAE, moderate MR. GDMT titrated, and she was discharged home, weight 171 lbs.  Echo 6/25 with EF 25%, mod LVH, mildly reduced RV, mod MR, sev TR. Likely in the setting of now persistent AF.    Admitted 8/25 with COPD exacerbation. Still smoking.   Here today for 1 month CHF follow-up. Reports her PCP recently treated her for PNA. Denies cough but notes chronic shortness of breath with light activity. No orthopnea or PND. Has chronic RLE edema after an orthopedic injury. Has been losing weight slowly, reports decreased appetite and sometimes gets abdominal pain after eating. Doesn't take lasix  day of appointments, usually takes in evening once she gets home. Cut back hydralazine  to 50 TID after last visit, she couldn't tolerate higher dose. Taking all other medications as prescribed.   Here today for 2 month CHF visit.   ROS: All systems negative except as listed in HPI, PMH and Problem List.  SH:  Social History   Socioeconomic History   Marital status: Widowed    Spouse name: Not on file   Number of children: 1   Years of education: 38  Highest education level: Not on file  Occupational History   Occupation: RETIRED [employer]  Tobacco Use   Smoking status: Some Days    Current packs/day: 0.50    Average packs/day: 0.5 packs/day for 58.7 years (29.3 ttl pk-yrs)    Types: Cigarettes    Start date: 11/13/1965   Smokeless tobacco: Never   Tobacco comments:    01/02/2024 Patient smokes about 7-8 cigarettes daily    smoking 2 cigarettes/day as of 10/23/20  Vaping Use   Vaping status: Never Used  Substance and Sexual Activity   Alcohol  use: No    Alcohol /week: 0.0 standard drinks of alcohol    Drug use: No   Sexual activity: Not Currently  Other Topics Concern   Not on file  Social  History Narrative   Patient reports it being difficult to pay for everything due to outstanding medical bills from hospital stay last year but states she is able to keep up with basic expenses.  Patient does have some concerns with her house's condition due to a water leak- had her roof repaired last month but has some leaking in the front which has affected her porch.  Patient owns her own car and is able to drive herself but has some concerns about the reliability of her car- has gotten taxi to the clinic for appointment in the past when her car broke down.   Social Drivers of Health   Tobacco Use: High Risk (07/22/2024)   Patient History    Smoking Tobacco Use: Some Days    Smokeless Tobacco Use: Never    Passive Exposure: Not on file  Financial Resource Strain: Low Risk (07/12/2023)   Overall Financial Resource Strain (CARDIA)    Difficulty of Paying Living Expenses: Not hard at all  Food Insecurity: No Food Insecurity (07/22/2024)   Epic    Worried About Programme Researcher, Broadcasting/film/video in the Last Year: Never true    Ran Out of Food in the Last Year: Never true  Transportation Needs: No Transportation Needs (07/22/2024)   Epic    Lack of Transportation (Medical): No    Lack of Transportation (Non-Medical): No  Physical Activity: Inactive (07/22/2024)   Exercise Vital Sign    Days of Exercise per Week: 0 days    Minutes of Exercise per Session: 0 min  Stress: Stress Concern Present (07/22/2024)   [employer] of Occupational Health - Occupational Stress Questionnaire    Feeling of Stress: To some extent  Social Connections: Moderately Integrated (07/22/2024)   Social Connection and Isolation Panel    Frequency of Communication with Friends and Family: Three times a week    Frequency of Social Gatherings with Friends and Family: Once a week    Attends Religious Services: 1 to 4 times per year    Active Member of [employer] or Organizations: Yes    Attends Banker Meetings: 1 to 4  times per year    Marital Status: Widowed  Intimate Partner Violence: Not At Risk (07/22/2024)   Epic    Fear of Current or Ex-Partner: No    Emotionally Abused: No    Physically Abused: No    Sexually Abused: No  Depression (PHQ2-9): Low Risk (07/22/2024)   Depression (PHQ2-9)    PHQ-2 Score: 3  Alcohol  Screen: Low Risk (07/12/2023)   Alcohol  Screen    Last Alcohol  Screening Score (AUDIT): 0  Housing: Unknown (07/22/2024)   Epic    Unable to Pay for Housing in the Last  Year: No    Number of Times Moved in the Last Year: Not on file    Homeless in the Last Year: No  Utilities: Not At Risk (07/22/2024)   Epic    Threatened with loss of utilities: No  Health Literacy: Adequate Health Literacy (07/22/2024)   B1300 Health Literacy    Frequency of need for help with medical instructions: Never   FH:  Family History  Problem Relation Age of Onset   Cancer Mother        LYMPHOMA   Heart disease Father    Other Father        TB   CVA Sister    Prostate cancer Brother 42   Diabetes Brother    Past Medical History:  Diagnosis Date   (HFimpEF) heart failure with improved ejection fraction (HCC)    a. 07/2018 Echo: EF 30-35%; b. 11/2019 Echo: EF 55-60%, no rwma, Gr2 DD, Nl RV size/fxn. Mild BAE. Mild MR/AI.   Arthritis    Asthma    Atopic dermatitis    CAD (coronary artery disease)    a. 2016 s/p CABG x 2 (LIMA->LAD, VG->OM); b. 08/2018 MV: EF 44%, no ischemia/infact.   Cardiac arrest (HCC) 10/2014   Cardiomyopathy, ischemic    a. 07/2018 Echo: EF 30-35%; 11/2019 Echo: EF 55-60%.   Carotid arterial disease    a. 11/2019 Carotid U/S   CHF (congestive heart failure) (HCC)    CKD (chronic kidney disease), stage III (HCC)    COPD (chronic obstructive pulmonary disease) (HCC)    Diabetes mellitus without complication (HCC)    Dysrhythmia    Esophageal dilatation 2013   GERD (gastroesophageal reflux disease)    Glaucoma    Gout    Headache    Hearing loss    History of blood  transfusion    Hypertension    Hypokalemia    Idiopathic angioedema    LGI bleed 08/06/2017   a. felt to be hemorrhoidal during that admission (no drop in Hgb).   Lower back pain    Paroxysmal atrial fibrillation (HCC)    a. Dx 2016-->h/o difficult to control rates (complicated by noncompliance), not felt to be a candidate for ablation or antiarrhythmic due to noncompliance; b. Recurrent AF 2021 - converted w/ IV dilt; c. CHA2DS2VASc = 7-->Xarelto .   Personal history of noncompliance with medical treatment, presenting hazards to health    Pneumonia    Prediabetes    Presence of permanent cardiac pacemaker    Right leg numbness    S/P CABG x 2 with clipping of LA appendage 11/14/2014   LIMA to LAD, SVG to OM, EVH via right thigh   Urine incontinence    Uterine fibroid    Current Outpatient Medications  Medication Sig Dispense Refill   acetaminophen  (TYLENOL ) 500 MG tablet Take 500 mg by mouth as needed for mild pain (pain score 1-3) or moderate pain (pain score 4-6).     Albuterol -Budesonide  (AIRSUPRA ) 90-80 MCG/ACT AERO Inhale 2 puffs into the lungs every 4 (four) hours as needed. 10.7 g 1   Alcohol  Swabs  (ALCOHOL  PADS) 70 % PADS 1 Act by Does not apply route 2 (two) times daily. 100 each 3   allopurinol  (ZYLOPRIM ) 100 MG tablet Take 1 tablet (100 mg total) by mouth daily. 90 tablet 1   atorvastatin  (LIPITOR ) 10 MG tablet TAKE 1 TABLET BY MOUTH EVERYDAY AT BEDTIME 90 tablet 0   Blood Glucose Monitoring Suppl (ONE TOUCH ULTRA 2) w/Device KIT 1  Act by Does not apply route 2 (two) times daily. 1 kit 3   gabapentin  (NEURONTIN ) 100 MG capsule TAKE 1 CAPSULE BY MOUTH EVERYDAY AT BEDTIME 30 capsule 1   hydrALAZINE  (APRESOLINE ) 25 MG tablet Take 2 tablets (50 mg total) by mouth 3 (three) times daily.     losartan  (COZAAR ) 100 MG tablet Take 1 tablet (100 mg total) by mouth daily. 90 tablet 1   ondansetron  (ZOFRAN ) 4 MG tablet Take 1 tablet (4 mg total) by mouth every 8 (eight) hours as needed  for nausea or vomiting. 20 tablet 0   ONETOUCH ULTRA test strip 1 each by Other route 2 (two) times daily. as directed 100 each 3   pantoprazole  (PROTONIX ) 40 MG tablet Take 1 tablet (40 mg total) by mouth daily. 90 tablet 0   spironolactone  (ALDACTONE ) 25 MG tablet Take 1 tablet (25 mg total) by mouth daily. 90 tablet 3   sucralfate  (CARAFATE ) 1 g tablet Take 1 tablet (1 g total) by mouth 4 (four) times daily -  with meals and at bedtime. 120 tablet 0   torsemide  (DEMADEX ) 20 MG tablet Take 1 tablet (20 mg total) by mouth daily. 90 tablet 3   TRELEGY ELLIPTA  100-62.5-25 MCG/ACT AEPB INHALE 1 PUFF INTO THE LUNGS DAILY 60 each 2   XARELTO  15 MG TABS tablet TAKE 1 TABLET (15 MG TOTAL) BY MOUTH DAILY WITH SUPPER 30 tablet 8   No current facility-administered medications for this visit.   There were no vitals taken for this visit.  Wt Readings from Last 3 Encounters:  07/22/24 73.5 kg (162 lb)  05/23/24 73.8 kg (162 lb 9.6 oz)  05/22/24 75.5 kg (166 lb 6.4 oz)   PHYSICAL EXAM: General:  Chronically ill appearing elderly female. Arrived in wheelchair Cor: JVP to midneck w/ prominent v waves. Regular rate & rhythm. 2/6 HSM Lungs: clear Abdomen: soft, nontender, nondistended.  Extremities: 1+ RLE edema, lower extremities are cool Neuro: alert & orientedx3. Affect flat.   ASSESSMENT & PLAN: 1. Chronic Systolic Heart Failure - Suspect Combined ICM /NICM with previous A fib RVR. RV Pacing? - In 2021 EF improved 55-60% but EF has now been down 25-30 % last couple of years - Echo (10/23): EF 20%, moderate LVH, RV moderately reduced, RVSP 46 mmHg, moderate BAE, mild to moderate MR, moderate TR, - Echo (4/24): EF 25%, RV moderately reduced, RVSP 55 mmHg, severe BAE, moderate MR - Echo (8/24): EF 30-35% severe BAE, moderate MR - Echo (6/25) EF 25% RV moderately reduced, PAP in 70s, moderate MR, severe TR - NYHA IIIb, confounded by COPD and orthopedic issues - Volume okay on exam and by ReDS  which is 30% - continue torsemide  20 mg daily. Does not take when she goes out.  - continue spironolactone  25 mg daily. - continue losartan  100 mg daily (failed Entresto  with chest pain).  - reduce hydralazine  to 50 mg TID (cannot tolerate higher dose d/t headache), refuses Imdur  d/t headaches. - No SGLT2i due UTI/yeast infections.  - No beta blocker high burden of RV pacing on device checks - Limited room to further titrate GDMT d/t intolerances - She is not currently interested in cardiac rehab - Would likely benefit from upgrade to CRT. Upgrade PPM to ICD aborted 8/24 d/t occlusion of left subclavian vein. Dr. Waddell saw patient and offered lead extraction and insertion of CRT, which she declined.  - Appears to be nearing end-stage. Explained that her GI symptoms and weight loss may be  d/t her HF. Not a candidate for advanced therapies. She is not interested in RHC or other invasive procedures  2. MR - severe functional MR/TR, - Not interested in considering mTEER, HF may be too advanced to derive any significant benefit   3. Permanent A fib  - Biotronik dual chamber PPM/AV nodal ablation 2022.  - Rate controlled - Continue Xarelto  15 mg daily   4. CAD - s/p CABG x2 2016 (LIMA to LAD and SVG to OM)  - no angina - No aspirin  with need for anticoagulation - Continue statin   5. COPD/tobacco abuse - Continues to smoke a few cigarettes per day - Recommend complete cessation  6. CKD IIIb - Baseline SCr 1.2-1.4 - Scr stable 1.16 on labs in 9/25   Follow-up:     [facility], PA-C  07/25/2024

## 2024-07-29 ENCOUNTER — Other Ambulatory Visit: Payer: Self-pay | Admitting: Internal Medicine

## 2024-07-29 NOTE — Telephone Encounter (Unsigned)
 Copied from CRM [id number]. Topic: Clinical - Medication Refill >> Jul 29, 2024  2:00 PM Delon T wrote: Medication: ondansetron  (ZOFRAN ) 4 MG tablet- given originally by the ER gabapentin  (NEURONTIN ) 100 MG capsule  Has the patient contacted their pharmacy? Yes (Agent: If no, request that the patient contact the pharmacy for the refill. If patient does not wish to contact the pharmacy document the reason why and proceed with request.) (Agent: If yes, when and what did the pharmacy advise?)  This is the patient's preferred pharmacy:  [id number] [place], [place] - [address] [facility] [address]  [place] [address] Phone: [phone] Fax: [phone]    Is this the correct pharmacy for this prescription? Yes If no, delete pharmacy and type the correct one.   Has the prescription been filled recently? Yes  Is the patient out of the medication? Yes  Has the patient been seen for an appointment in the last year OR does the patient have an upcoming appointment? Yes  Can we respond through MyChart? Yes  Agent: Please be advised that Rx refills may take up to 3 business days. We ask that you follow-up with your pharmacy.

## 2024-07-31 ENCOUNTER — Emergency Department (HOSPITAL_COMMUNITY)
Admission: EM | Admit: 2024-07-31 | Discharge: 2024-08-01 | Disposition: A | Discharge status: Home / Self Care | Source: Home / Self Care | Attending: Emergency Medicine | Admitting: Emergency Medicine

## 2024-07-31 DIAGNOSIS — Z7901 Long term (current) use of anticoagulants: Secondary | ICD-10-CM | POA: Insufficient documentation

## 2024-07-31 DIAGNOSIS — I482 Chronic atrial fibrillation, unspecified: Secondary | ICD-10-CM | POA: Insufficient documentation

## 2024-07-31 DIAGNOSIS — Z79899 Other long term (current) drug therapy: Secondary | ICD-10-CM | POA: Diagnosis not present

## 2024-07-31 DIAGNOSIS — J4489 Other specified chronic obstructive pulmonary disease: Secondary | ICD-10-CM | POA: Insufficient documentation

## 2024-07-31 DIAGNOSIS — I509 Heart failure, unspecified: Secondary | ICD-10-CM | POA: Diagnosis not present

## 2024-07-31 DIAGNOSIS — Z951 Presence of aortocoronary bypass graft: Secondary | ICD-10-CM | POA: Diagnosis not present

## 2024-07-31 DIAGNOSIS — N183 Chronic kidney disease, stage 3 unspecified: Secondary | ICD-10-CM | POA: Insufficient documentation

## 2024-07-31 DIAGNOSIS — I2510 Atherosclerotic heart disease of native coronary artery without angina pectoris: Secondary | ICD-10-CM | POA: Diagnosis not present

## 2024-07-31 DIAGNOSIS — E1122 Type 2 diabetes mellitus with diabetic chronic kidney disease: Secondary | ICD-10-CM | POA: Diagnosis not present

## 2024-07-31 DIAGNOSIS — I130 Hypertensive heart and chronic kidney disease with heart failure and stage 1 through stage 4 chronic kidney disease, or unspecified chronic kidney disease: Secondary | ICD-10-CM | POA: Diagnosis not present

## 2024-07-31 DIAGNOSIS — R053 Chronic cough: Secondary | ICD-10-CM | POA: Insufficient documentation

## 2024-07-31 DIAGNOSIS — R0602 Shortness of breath: Secondary | ICD-10-CM | POA: Insufficient documentation

## 2024-07-31 DIAGNOSIS — R0789 Other chest pain: Secondary | ICD-10-CM | POA: Diagnosis present

## 2024-07-31 NOTE — ED Triage Notes (Signed)
 Rt sided chest pain since yesterday with some sob

## 2024-08-01 ENCOUNTER — Encounter (HOSPITAL_COMMUNITY): Payer: Self-pay | Admitting: *Deleted

## 2024-08-01 ENCOUNTER — Emergency Department (HOSPITAL_COMMUNITY)

## 2024-08-01 ENCOUNTER — Other Ambulatory Visit: Payer: Self-pay

## 2024-08-01 LAB — HEPATIC FUNCTION PANEL
ALT: 12 U/L (ref 0–44)
AST: 30 U/L (ref 15–41)
Albumin: 3.6 g/dL (ref 3.5–5.0)
Alkaline Phosphatase: 124 U/L (ref 38–126)
Bilirubin, Direct: 0.3 mg/dL — ABNORMAL HIGH (ref 0.0–0.2)
Indirect Bilirubin: 0.7 mg/dL (ref 0.3–0.9)
Total Bilirubin: 1 mg/dL (ref 0.0–1.2)
Total Protein: 6.4 g/dL — ABNORMAL LOW (ref 6.5–8.1)

## 2024-08-01 LAB — BASIC METABOLIC PANEL WITH GFR
Anion gap: 9 (ref 5–15)
BUN: 29 mg/dL — ABNORMAL HIGH (ref 8–23)
CO2: 24 mmol/L (ref 22–32)
Calcium: 8.9 mg/dL (ref 8.9–10.3)
Chloride: 109 mmol/L (ref 98–111)
Creatinine, Ser: 1.27 mg/dL — ABNORMAL HIGH (ref 0.44–1.00)
GFR, Estimated: 43 mL/min — ABNORMAL LOW (ref >60)
Glucose, Bld: 94 mg/dL (ref 70–99)
Potassium: 3.8 mmol/L (ref 3.5–5.1)
Sodium: 142 mmol/L (ref 135–145)

## 2024-08-01 LAB — CBC
HCT: 44.8 % (ref 36.0–46.0)
Hemoglobin: 14.6 g/dL (ref 12.0–15.0)
MCH: 29.2 pg (ref 26.0–34.0)
MCHC: 32.6 g/dL (ref 30.0–36.0)
MCV: 89.6 fL (ref 80.0–100.0)
Platelets: 102 K/uL — ABNORMAL LOW (ref 150–400)
RBC: 5 MIL/uL (ref 3.87–5.11)
RDW: 17.3 % — ABNORMAL HIGH (ref 11.5–15.5)
WBC: 5.4 K/uL (ref 4.0–10.5)
nRBC: 0 % (ref 0.0–0.2)

## 2024-08-01 LAB — D-DIMER, QUANTITATIVE: D-Dimer, Quant: 0.67 ug{FEU}/mL — ABNORMAL HIGH (ref 0.00–0.50)

## 2024-08-01 LAB — LIPASE, BLOOD: Lipase: 16 U/L (ref 11–51)

## 2024-08-01 LAB — TROPONIN T, HIGH SENSITIVITY
Troponin T High Sensitivity: 29 ng/L — ABNORMAL HIGH (ref 0–19)
Troponin T High Sensitivity: 32 ng/L — ABNORMAL HIGH (ref 0–19)

## 2024-08-01 LAB — PRO BRAIN NATRIURETIC PEPTIDE: Pro Brain Natriuretic Peptide: 1793 pg/mL — ABNORMAL HIGH (ref <300.0)

## 2024-08-01 MED ADMIN — Acetaminophen Tab 500 MG: 1000 mg | ORAL | @ 04:00:00 | NDC 00904673061

## 2024-08-01 MED FILL — Acetaminophen Tab 500 MG: 1000.0000 mg | ORAL | Qty: 2 | Status: AC

## 2024-08-01 NOTE — ED Notes (Signed)
 Pt ambulated with pulse ox well. Saturations at 97% and above.

## 2024-08-01 NOTE — ED Notes (Signed)
 Biotronic pacemaker interrogated. [employer] contacted and awaiting fax of results.

## 2024-08-01 NOTE — ED Provider Notes (Signed)
 [place] EMERGENCY DEPARTMENT AT [facility] Provider Note  CSN: [id number] Arrival date & time: 07/31/24 2328  Chief Complaint(s) Chest Pain and Shortness of Breath  HPI & MDM History provided by patient.  [name] [name] is a 78 y.o. female here for 5 hours of right sided shoulder girdle/upper chest pain described as aching.  Worse with range of motion and certain movements.  Pain was not exertional.  It resolved on the drive here to the emergency department.  Patient reports she has had nasal congestion and coughing over the past week.  Was prescribed nasal congestion medicine by her PCP which she has not taken due to the strength.  She endorses baseline shortness of breath usually with exertion.  No change with the chest pain.  No nausea or vomiting.  She did not endorse epigastric and right upper quadrant discomfort around the same time.  Denies any diarrhea but does endorse constipation requiring laxative use.  Chest Pain Associated symptoms: shortness of breath   Shortness of Breath Associated symptoms: chest pain    Clinical Course as of 08/01/24 0359  Thu Aug 01, 2024  0140 Differential diagnosis considered and workup below.  EKG with ventricularly paced rhythm.  Troponins done in triage sure slightly elevated but flat around 30.  No prior for comparison.  Chest pain itself is highly atypical for ACS.  Chest x-ray without evidence of pneumonia, pneumothorax,  pleural effusions.  She does have pulmonary vascular congestion without overt pulmonary edema.  Patient does have a history of diastolic heart failure currently on torsemide . No significant volume overload on exam. proBNP added.  She does have trace edema in the right lower extremity following the orthopedic surgery several weeks ago.  Patient states that she is on Xarelto  currently.  Denies any prior history of DVT/PE.  Dimer ordered to rule out PE.  CBC without leukocytosis or anemia  BMP without  significant electrolyte derangements.  Renal sufficiency with baseline renal function.  Added LFTs and lipase to rule out biliary disease. [PC]  0315 LFTs without evidence of biliary obstruction.  No pancreatitis.  Third troponin was again flat.  Doubt ACS.  proBNP elevated but below the acute decompensated CHF level based on her age.  She again does not have any pulmonary edema on exam nor x-ray and no significant peripheral edema concerning for CHF exacerbation.  Will ambulate with pulse ox to assess increased work of breathing or desaturation.  Patient's dimer was slightly elevated as well but again not below the age-related threshold.  Will discuss results with patient and determine risk versus benefit after patient is ambulated. [PC]  0349 Ambulated on RA with pulse ox and maintained O2 sats of 97-100%. Patient reported no chest pain or SOB/DOE. After sitting down, she coughed and had chest discomfort from cough itself, which favors MSK etiology.  We discussed dimer results and she was given option for CTA. With shared decision making, we felt that the risk of contrast outweighed the benefits of obtaining the CTA.   [PC]    Clinical Course User Index [PC] Markevius Trombetta, Raynell Moder, MD   Medical Decision Making Amount and/or Complexity of Data Reviewed Labs: ordered. Decision-making details documented in ED Course. Radiology: ordered and independent interpretation performed. Decision-making details documented in ED Course. ECG/medicine tests: ordered and independent interpretation performed. Decision-making details documented in ED Course.  Risk OTC drugs. Decision regarding hospitalization.    Final Clinical Impression(s) / ED Diagnoses Final diagnoses:  Chronic cough  Right-sided  chest wall pain   The patient appears reasonably screened and/or stabilized for discharge and I doubt any other medical condition or other [facility] requiring further screening, evaluation, or treatment in the  ED at this time. I have discussed the findings, Dx and Tx plan with the patient/family who expressed understanding and agree(s) with the plan. Discharge instructions discussed at length. The patient/family was given strict return precautions who verbalized understanding of the instructions. No further questions at time of discharge.  Disposition: Discharge  Condition: Good  ED Discharge Orders     None       Follow Up: Joshua Debby CROME, MD [address] [place] [address] [phone]  Call  to schedule an appointment for close follow up     Past Medical History Past Medical History:  Diagnosis Date   (HFimpEF) heart failure with improved ejection fraction (HCC)    a. 07/2018 Echo: EF 30-35%; b. 11/2019 Echo: EF 55-60%, no rwma, Gr2 DD, Nl RV size/fxn. Mild BAE. Mild MR/AI.   Arthritis    Asthma    Atopic dermatitis    CAD (coronary artery disease)    a. 2016 s/p CABG x 2 (LIMA->LAD, VG->OM); b. 08/2018 MV: EF 44%, no ischemia/infact.   Cardiac arrest (HCC) 10/2014   Cardiomyopathy, ischemic    a. 07/2018 Echo: EF 30-35%; 11/2019 Echo: EF 55-60%.   Carotid arterial disease    a. 11/2019 Carotid U/S   CHF (congestive heart failure) (HCC)    CKD (chronic kidney disease), stage III (HCC)    COPD (chronic obstructive pulmonary disease) (HCC)    Diabetes mellitus without complication (HCC)    Dysrhythmia    Esophageal dilatation 2013   GERD (gastroesophageal reflux disease)    Glaucoma    Gout    Headache    Hearing loss    History of blood transfusion    Hypertension    Hypokalemia    Idiopathic angioedema    LGI bleed 08/06/2017   a. felt to be hemorrhoidal during that admission (no drop in Hgb).   Lower back pain    Paroxysmal atrial fibrillation (HCC)    a. Dx 2016-->h/o difficult to control rates (complicated by noncompliance), not felt to be a candidate for ablation or antiarrhythmic due to noncompliance; b. Recurrent AF 2021 - converted w/ IV dilt; c.  CHA2DS2VASc = 7-->Xarelto .   Personal history of noncompliance with medical treatment, presenting hazards to health    Pneumonia    Prediabetes    Presence of permanent cardiac pacemaker    Right leg numbness    S/P CABG x 2 with clipping of LA appendage 11/14/2014   LIMA to LAD, SVG to OM, EVH via right thigh   Urine incontinence    Uterine fibroid    Patient Active Problem List   Diagnosis Date Noted   Pneumonia due to infectious organism 04/11/2024   Presence of heart assist device (HCC) 04/11/2024   Lung nodule 04/08/2024   COPD exacerbation (HCC) 04/07/2024   Acute on chronic combined systolic and diastolic CHF (congestive heart failure) (HCC) 03/06/2024   Acute on chronic heart failure (HCC) 03/06/2024   Dyslipidemia, goal LDL below 70 02/01/2024   Acute non-recurrent pansinusitis 02/01/2024   Encounter for general adult medical examination with abnormal findings 02/01/2024   Type 2 diabetes mellitus with hyperlipidemia (HCC) 12/14/2022   Chronic combined systolic and diastolic CHF (congestive heart failure) (HCC) 06/17/2022   Chronic anticoagulation 06/06/2022   Mitral regurgitation 06/06/2022   Controlled  type 2 diabetes mellitus without complication, without long-term current use of insulin  (HCC) 06/06/2022   Pacemaker 02/22/2022   Obstructive sleep apnea 09/21/2020   Obesity (BMI 30.0-34.9) 02/10/2020   COPD (chronic obstructive pulmonary disease) (HCC) 10/26/2019   Thrombocytopenia 09/14/2017   Medication noncompliance due to cognitive impairment 09/06/2017   Persistent atrial fibrillation (HCC) 08/04/2017   Cardiomyopathy, ischemic 02/08/2017   CKD (chronic kidney disease), stage III (HCC) 01/30/2017   Coronary artery disease due to lipid rich plaque    Essential hypertension    Home Medication(s) Prior to Admission medications  Medication Sig Start Date End Date Taking? Authorizing Provider  acetaminophen  (TYLENOL ) 500 MG tablet Take 500 mg by mouth as needed  for mild pain (pain score 1-3) or moderate pain (pain score 4-6).    [provider]  Albuterol -Budesonide  (AIRSUPRA ) 90-80 MCG/ACT AERO Inhale 2 puffs into the lungs every 4 (four) hours as needed. 01/10/24   Sagardia, Miguel Jose, MD  Alcohol  Swabs  (ALCOHOL  PADS) 70 % PADS 1 Act by Does not apply route 2 (two) times daily. 04/28/23   Joshua Debby CROME, MD  allopurinol  (ZYLOPRIM ) 100 MG tablet Take 1 tablet (100 mg total) by mouth daily. 06/17/24   Bensimhon, Toribio SAUNDERS, MD  atorvastatin  (LIPITOR ) 10 MG tablet TAKE 1 TABLET BY MOUTH EVERYDAY AT BEDTIME 07/22/23   Joshua Debby CROME, MD  Blood Glucose Monitoring Suppl (ONE TOUCH ULTRA 2) w/Device KIT 1 Act by Does not apply route 2 (two) times daily. 04/28/23   Joshua Debby CROME, MD  gabapentin  (NEURONTIN ) 100 MG capsule TAKE 1 CAPSULE BY MOUTH EVERYDAY AT BEDTIME 03/22/24   Gershon Donnice SAUNDERS, DPM  hydrALAZINE  (APRESOLINE ) 25 MG tablet Take 2 tablets (50 mg total) by mouth 3 (three) times daily. 05/23/24   Colletta Manuelita Garre, PA-C  losartan  (COZAAR ) 100 MG tablet Take 1 tablet (100 mg total) by mouth daily. 12/22/23   Alvia Corean CROME, FNP  ondansetron  (ZOFRAN ) 4 MG tablet Take 1 tablet (4 mg total) by mouth every 8 (eight) hours as needed for nausea or vomiting. 03/06/24   Danford, Lonni SQUIBB, MD  [name] ULTRA test strip 1 each by Other route 2 (two) times daily. as directed 04/28/23   Joshua Debby CROME, MD  pantoprazole  (PROTONIX ) 40 MG tablet Take 1 tablet (40 mg total) by mouth daily. 07/12/24   Towana Ozell BROCKS, MD  spironolactone  (ALDACTONE ) 25 MG tablet Take 1 tablet (25 mg total) by mouth daily. 05/24/24   Bensimhon, Toribio SAUNDERS, MD  sucralfate  (CARAFATE ) 1 g tablet Take 1 tablet (1 g total) by mouth 4 (four) times daily -  with meals and at bedtime. 07/12/24   Towana Ozell BROCKS, MD  torsemide  (DEMADEX ) 20 MG tablet Take 1 tablet (20 mg total) by mouth daily. 07/15/24   Jeffrie Oneil BROCKS, MD  TRELEGY ELLIPTA  100-62.5-25 MCG/ACT AEPB INHALE 1 PUFF  INTO THE LUNGS DAILY 01/24/24   Joshua Debby CROME, MD  XARELTO  15 MG TABS tablet TAKE 1 TABLET (15 MG TOTAL) BY MOUTH DAILY WITH SUPPER 01/09/24   Bensimhon, Toribio SAUNDERS, MD  losartan  (COZAAR ) 50 MG tablet Take 1 tablet (50 mg total) by mouth daily. 12/16/22   Bensimhon, Toribio SAUNDERS, MD  Allergies Bee venom, Ivp dye [iodinated contrast media], Ace inhibitors, Atenolol, Codeine, Entresto  [sacubitril -valsartan ], Isosorbide , Shrimp [shellfish allergy], Simvastatin, Jardiance [empagliflozin], and Penicillin g  Review of Systems Review of Systems  Respiratory:  Positive for shortness of breath.   Cardiovascular:  Positive for chest pain.   As noted in HPI  Physical Exam Vital Signs  I have reviewed the triage vital signs BP (!) 142/90   Pulse (!) 53   Temp 98.4 F (36.9 C)   Resp 14   Ht 5' 7 (1.702 m)   Wt 73.5 kg   SpO2 100%   BMI 25.38 kg/m   Physical Exam Vitals reviewed.  Constitutional:      General: She is not in acute distress.    Appearance: She is well-developed. She is not diaphoretic.  HENT:     Head: Normocephalic and atraumatic.     Nose: Nose normal.  Eyes:     General: No scleral icterus.       Right eye: No discharge.        Left eye: No discharge.     Conjunctiva/sclera: Conjunctivae normal.     Pupils: Pupils are equal, round, and reactive to light.  Cardiovascular:     Rate and Rhythm: Normal rate and regular rhythm.     Heart sounds: No murmur heard.    No friction rub. No gallop.  Pulmonary:     Effort: Pulmonary effort is normal. No respiratory distress.     Breath sounds: Normal breath sounds. No stridor. No rales.  Chest:     Chest wall: No tenderness.  Abdominal:     General: There is no distension.     Palpations: Abdomen is soft.     Tenderness: There is abdominal tenderness (mild discomfort) in the right upper quadrant and  epigastric area. There is no guarding or rebound. Negative signs include Murphy's sign.  Musculoskeletal:        General: No tenderness.     Cervical back: Normal range of motion and neck supple.     Right lower leg: 1+ Edema present.     Left lower leg: No edema.  Skin:    General: Skin is warm and dry.     Findings: No erythema or rash.  Neurological:     Mental Status: She is alert and oriented to person, place, and time.     ED Results and Treatments Labs (all labs ordered are listed, but only abnormal results are displayed) Labs Reviewed  BASIC METABOLIC PANEL WITH GFR - Abnormal; Notable for the following components:      Result Value   BUN 29 (*)    Creatinine, Ser 1.27 (*)    GFR, Estimated 43 (*)    All other components within normal limits  CBC - Abnormal; Notable for the following components:   RDW 17.3 (*)    Platelets 102 (*)    All other components within normal limits  HEPATIC FUNCTION PANEL - Abnormal; Notable for the following components:   Total Protein 6.4 (*)    Bilirubin, Direct 0.3 (*)    All other components within normal limits  PRO BRAIN NATRIURETIC PEPTIDE - Abnormal; Notable for the following components:   Pro Brain Natriuretic Peptide 1,793.0 (*)    All other components within normal limits  D-DIMER, QUANTITATIVE - Abnormal; Notable for the following components:   D-Dimer, Quant 0.67 (*)    All other components within normal limits  TROPONIN T, HIGH SENSITIVITY - Abnormal; Notable for  the following components:   Troponin T High Sensitivity 32 (*)    All other components within normal limits  TROPONIN T, HIGH SENSITIVITY - Abnormal; Notable for the following components:   Troponin T High Sensitivity 29 (*)    All other components within normal limits  LIPASE, BLOOD                                                                                                                         EKG   EKG Interpretation Date/Time:  Thursday August 01 2024 01:57:40 EST Ventricular Rate:  86 PR Interval:    QRS Duration:  83 QT Interval:  196 QTC Calculation: 191 R Axis:   143  Text Interpretation: Atrial flutter/fibrillation Ventricular bigeminy Probable right ventricular hypertrophy Confirmed by Trine Likes [phone]) on 08/01/2024 2:13:56 AM       Radiology DG Chest 2 View Result Date: 08/01/2024 EXAM: 2 VIEW(S) XRAY OF THE CHEST 08/01/2024 12:23:00 AM COMPARISON: 07/12/2024 CLINICAL HISTORY: chest pain rt sided since yesterday with some sob hx chf FINDINGS: LINES, TUBES AND DEVICES: Left chest cardiac pacing device noted. LUNGS AND PLEURA: Progressive pulmonary vascular redistribution of the lung apices in keeping with changes of early cardiogenic failure or volume overload, without superimposed overt pulmonary edema. Mild right basilar atelectasis. Trace right pleural effusion. No pneumothorax. HEART AND MEDIASTINUM: Stable cardiomegaly. Left atrial appendage clip noted. BONES AND SOFT TISSUES: Median sternotomy noted. Multilevel degenerative changes of thoracic spine. No acute osseous abnormality. IMPRESSION: 1. Progressive pulmonary vascular redistribution of the lung apices, consistent with early cardiogenic failure or volume overload, without overt pulmonary edema. 2. Trace right pleural effusion with mild right basilar atelectasis. Electronically signed by: Dorethia Molt MD 08/01/2024 12:34 AM EST RP Workstation: [id number]    Medications Ordered in ED Medications  acetaminophen  (TYLENOL ) tablet 1,000 mg (has no administration in time range)   Procedures Procedures  (including critical care time)   This chart was dictated using voice recognition software.  Despite best efforts to proofread,  errors can occur which can change the documentation meaning.   Trine Likes Moder, MD 08/01/24 [phone]

## 2024-08-01 NOTE — ED Notes (Signed)
 [employer] papers faxed over and given to Dr. Trine.

## 2024-08-02 MED ORDER — ONDANSETRON HCL 4 MG PO TABS: 4.0000 mg | ORAL_TABLET | Freq: Three times a day (TID) | ORAL | 0 refills | Status: AC | PRN

## 2024-08-02 MED ORDER — GABAPENTIN 100 MG PO CAPS: 100.0000 mg | ORAL_CAPSULE | Freq: Every day | ORAL | 1 refills | Status: AC

## 2024-08-27 ENCOUNTER — Ambulatory Visit: Admitting: Internal Medicine

## 2024-08-29 ENCOUNTER — Emergency Department (HOSPITAL_COMMUNITY)

## 2024-08-29 ENCOUNTER — Emergency Department (HOSPITAL_COMMUNITY)
Admission: EM | Admit: 2024-08-29 | Discharge: 2024-08-29 | Disposition: A | Discharge status: Home / Self Care | Attending: Emergency Medicine | Admitting: Emergency Medicine

## 2024-08-29 DIAGNOSIS — J329 Chronic sinusitis, unspecified: Secondary | ICD-10-CM

## 2024-08-29 DIAGNOSIS — R0602 Shortness of breath: Secondary | ICD-10-CM | POA: Insufficient documentation

## 2024-08-29 DIAGNOSIS — R531 Weakness: Secondary | ICD-10-CM | POA: Insufficient documentation

## 2024-08-29 DIAGNOSIS — R0981 Nasal congestion: Secondary | ICD-10-CM | POA: Insufficient documentation

## 2024-08-29 DIAGNOSIS — R42 Dizziness and giddiness: Secondary | ICD-10-CM

## 2024-08-29 DIAGNOSIS — R059 Cough, unspecified: Secondary | ICD-10-CM | POA: Insufficient documentation

## 2024-08-29 DIAGNOSIS — Z950 Presence of cardiac pacemaker: Secondary | ICD-10-CM | POA: Diagnosis not present

## 2024-08-29 LAB — COMPREHENSIVE METABOLIC PANEL WITH GFR
ALT: 12 U/L (ref 0–44)
AST: 23 U/L (ref 15–41)
Albumin: 3.7 g/dL (ref 3.5–5.0)
Alkaline Phosphatase: 158 U/L — ABNORMAL HIGH (ref 38–126)
Anion gap: 11 (ref 5–15)
BUN: 32 mg/dL — ABNORMAL HIGH (ref 8–23)
CO2: 22 mmol/L (ref 22–32)
Calcium: 9.2 mg/dL (ref 8.9–10.3)
Chloride: 110 mmol/L (ref 98–111)
Creatinine, Ser: 1.33 mg/dL — ABNORMAL HIGH (ref 0.44–1.00)
GFR, Estimated: 40 mL/min — ABNORMAL LOW
Glucose, Bld: 91 mg/dL (ref 70–99)
Potassium: 4.3 mmol/L (ref 3.5–5.1)
Sodium: 143 mmol/L (ref 135–145)
Total Bilirubin: 1.4 mg/dL — ABNORMAL HIGH (ref 0.0–1.2)
Total Protein: 6.7 g/dL (ref 6.5–8.1)

## 2024-08-29 LAB — CBC WITH DIFFERENTIAL/PLATELET
Abs Immature Granulocytes: 0.01 K/uL (ref 0.00–0.07)
Basophils Absolute: 0 K/uL (ref 0.0–0.1)
Basophils Relative: 1 %
Eosinophils Absolute: 0 K/uL (ref 0.0–0.5)
Eosinophils Relative: 1 %
HCT: 41.6 % (ref 36.0–46.0)
Hemoglobin: 13.4 g/dL (ref 12.0–15.0)
Immature Granulocytes: 0 %
Lymphocytes Relative: 24 %
Lymphs Abs: 1.1 K/uL (ref 0.7–4.0)
MCH: 29.3 pg (ref 26.0–34.0)
MCHC: 32.2 g/dL (ref 30.0–36.0)
MCV: 91 fL (ref 80.0–100.0)
Monocytes Absolute: 0.4 K/uL (ref 0.1–1.0)
Monocytes Relative: 9 %
Neutro Abs: 2.9 K/uL (ref 1.7–7.7)
Neutrophils Relative %: 65 %
Platelets: 75 K/uL — ABNORMAL LOW (ref 150–400)
RBC: 4.57 MIL/uL (ref 3.87–5.11)
RDW: 18.8 % — ABNORMAL HIGH (ref 11.5–15.5)
WBC: 4.5 K/uL (ref 4.0–10.5)
nRBC: 0 % (ref 0.0–0.2)

## 2024-08-29 LAB — I-STAT CHEM 8, ED
BUN: 45 mg/dL — ABNORMAL HIGH (ref 8–23)
Calcium, Ion: 0.98 mmol/L — ABNORMAL LOW (ref 1.15–1.40)
Chloride: 114 mmol/L — ABNORMAL HIGH (ref 98–111)
Creatinine, Ser: 1.2 mg/dL — ABNORMAL HIGH (ref 0.44–1.00)
Glucose, Bld: 90 mg/dL (ref 70–99)
HCT: 46 % (ref 36.0–46.0)
Hemoglobin: 15.6 g/dL — ABNORMAL HIGH (ref 12.0–15.0)
Potassium: 5.8 mmol/L — ABNORMAL HIGH (ref 3.5–5.1)
Sodium: 144 mmol/L (ref 135–145)
TCO2: 21 mmol/L — ABNORMAL LOW (ref 22–32)

## 2024-08-29 LAB — RESP PANEL BY RT-PCR (RSV, FLU A&B, COVID)  RVPGX2
Influenza A by PCR: NEGATIVE
Influenza B by PCR: NEGATIVE
Resp Syncytial Virus by PCR: NEGATIVE
SARS Coronavirus 2 by RT PCR: NEGATIVE

## 2024-08-29 LAB — TROPONIN T, HIGH SENSITIVITY
Troponin T High Sensitivity: 33 ng/L — ABNORMAL HIGH (ref 0–19)
Troponin T High Sensitivity: 31 ng/L — ABNORMAL HIGH (ref 0–19)

## 2024-08-29 MED ORDER — LACTATED RINGERS IV BOLUS
1000.0000 mL | Freq: Once | INTRAVENOUS | Status: AC
  Administered 2024-08-29: 1000 mL via INTRAVENOUS

## 2024-08-29 MED ORDER — DOXYCYCLINE HYCLATE 100 MG PO CAPS: 100.0000 mg | ORAL_CAPSULE | Freq: Two times a day (BID) | ORAL | 0 refills | Status: AC

## 2024-08-29 NOTE — ED Triage Notes (Signed)
 Pt bibems from home with complaints with her pacemaker. Dizziness and weakness for almost 1 week. Run of vtach on in route.   BP 140/90 HR 120 O2 95%

## 2024-08-29 NOTE — ED Provider Notes (Signed)
 " [name] EMERG[facility] DEPARTMENT AT [facility] Provider Note   CSN: [id number] Arrival date & time: 08/29/24  1057     Patient presents with: No chief complaint on file.   [name] [name] is a 79 y.o. female.   79 year old female presents from home due to weakness for about a week.  Patient was diagnosed with pneumonia in the past but only did a few days of antibiotics.  States that she has not had any chest pain or chest pressure.  Denies any urinary symptoms.  No fever.  Has increased cough and congestion.  Called EMS and she had a run of what they thought was V. tach.  They did not start amiodarone .  I did review that strips looks like she probably did have a 4-5 beat run.  She does have a pacemaker present.  Denies any syncope or near syncope.       Prior to Admission medications  Medication Sig Start Date End Date Taking? Authorizing Provider  acetaminophen  (TYLENOL ) 500 MG tablet Take 500 mg by mouth as needed for mild pain (pain score 1-3) or moderate pain (pain score 4-6).    [provider]  Albuterol -Budesonide  (AIRSUPRA ) 90-80 MCG/ACT AERO Inhale 2 puffs into the lungs every 4 (four) hours as needed. 01/10/24   Sagardia, Miguel Jose, MD  Alcohol  Swabs  (ALCOHOL  PADS) 70 % PADS 1 Act by Does not apply route 2 (two) times daily. 04/28/23   Joshua Debby CROME, MD  allopurinol  (ZYLOPRIM ) 100 MG tablet Take 1 tablet (100 mg total) by mouth daily. 06/17/24   Bensimhon, Toribio SAUNDERS, MD  atorvastatin  (LIPITOR ) 10 MG tablet TAKE 1 TABLET BY MOUTH EVERYDAY AT BEDTIME 07/22/23   Joshua Debby CROME, MD  Blood Glucose Monitoring Suppl (ONE TOUCH ULTRA 2) w/Device KIT 1 Act by Does not apply route 2 (two) times daily. 04/28/23   Joshua Debby CROME, MD  gabapentin  (NEURONTIN ) 100 MG capsule Take 1 capsule (100 mg total) by mouth at bedtime. 08/02/24   Joshua Debby CROME, MD  hydrALAZINE  (APRESOLINE ) 25 MG tablet Take 2 tablets (50 mg total) by mouth 3 (three) times daily. 05/23/24   Colletta Manuelita Garre, PA-C  losartan  (COZAAR ) 100 MG tablet Take 1 tablet (100 mg total) by mouth daily. 12/22/23   Alvia Corean CROME, FNP  ondansetron  (ZOFRAN ) 4 MG tablet Take 1 tablet (4 mg total) by mouth every 8 (eight) hours as needed for nausea or vomiting. 08/02/24   Joshua Debby CROME, MD  [facility] ULTRA test strip 1 each by Other route 2 (two) times daily. as directed 04/28/23   Joshua Debby CROME, MD  pantoprazole  (PROTONIX ) 40 MG tablet Take 1 tablet (40 mg total) by mouth daily. 07/12/24   Towana Ozell BROCKS, MD  spironolactone  (ALDACTONE ) 25 MG tablet Take 1 tablet (25 mg total) by mouth daily. 05/24/24   Bensimhon, Toribio SAUNDERS, MD  sucralfate  (CARAFATE ) 1 g tablet Take 1 tablet (1 g total) by mouth 4 (four) times daily -  with meals and at bedtime. 07/12/24   Towana Ozell BROCKS, MD  torsemide  (DEMADEX ) 20 MG tablet Take 1 tablet (20 mg total) by mouth daily. 07/15/24   Jeffrie Oneil BROCKS, MD  TRELEGY ELLIPTA  100-62.5-25 MCG/ACT AEPB INHALE 1 PUFF INTO THE LUNGS DAILY 01/24/24   Joshua Debby CROME, MD  XARELTO  15 MG TABS tablet TAKE 1 TABLET (15 MG TOTAL) BY MOUTH DAILY WITH SUPPER 01/09/24   Bensimhon, Toribio SAUNDERS, MD  losartan  (COZAAR ) 50 MG tablet  Take 1 tablet (50 mg total) by mouth daily. 12/16/22   Bensimhon, Toribio SAUNDERS, MD    Allergies: Bee venom, Ivp dye [iodinated contrast media], Ace inhibitors, Atenolol, Codeine, Entresto  [sacubitril -valsartan ], Isosorbide , Shrimp [shellfish allergy], Simvastatin, Jardiance [empagliflozin], and Penicillin g    Review of Systems  All other systems reviewed and are negative.   Updated Vital Signs There were no vitals taken for this visit.  Physical Exam Vitals and nursing note reviewed.  Constitutional:      General: She is not in acute distress.    Appearance: Normal appearance. She is well-developed. She is not toxic-appearing.  HENT:     Head: Normocephalic and atraumatic.  Eyes:     General: Lids are normal.     Conjunctiva/sclera: Conjunctivae normal.      Pupils: Pupils are equal, round, and reactive to light.  Neck:     Thyroid : No thyroid  mass.     Trachea: No tracheal deviation.  Cardiovascular:     Rate and Rhythm: Normal rate. Rhythm irregular.     Heart sounds: Normal heart sounds. No murmur heard.    No gallop.  Pulmonary:     Effort: Pulmonary effort is normal. No respiratory distress.     Breath sounds: Normal breath sounds. No stridor. No decreased breath sounds, wheezing, rhonchi or rales.  Abdominal:     General: There is no distension.     Palpations: Abdomen is soft.     Tenderness: There is no abdominal tenderness. There is no rebound.  Musculoskeletal:        General: No tenderness. Normal range of motion.     Cervical back: Normal range of motion and neck supple.  Skin:    General: Skin is warm and dry.     Findings: No abrasion or rash.  Neurological:     Mental Status: She is alert and oriented to person, place, and time. Mental status is at baseline.     GCS: GCS eye subscore is 4. GCS verbal subscore is 5. GCS motor subscore is 6.     Cranial Nerves: No cranial nerve deficit.     Sensory: No sensory deficit.     Motor: Motor function is intact.  Psychiatric:        Attention and Perception: Attention normal.        Speech: Speech normal.        Behavior: Behavior normal.     (all labs ordered are listed, but only abnormal results are displayed) Labs Reviewed  RESP PANEL BY RT-PCR (RSV, FLU A&B, COVID)  [id number]  CBC WITH DIFFERENTIAL/PLATELET  COMPREHENSIVE METABOLIC PANEL WITH GFR  I-STAT CHEM 8, ED  TROPONIN T, HIGH SENSITIVITY    EKG: EKG Interpretation Date/Time:  Thursday August 29 2024 11:11:47 EST Ventricular Rate:  106 PR Interval:    QRS Duration:  103 QT Interval:  364 QTC Calculation: 355 R Axis:   124  Text Interpretation: Atrial fibrillation Ventricular tachycardia, unsustained Right axis deviation Consider left ventricular hypertrophy Abnormal T, consider ischemia, lateral leads No  significant change since last tracing Confirmed by Dasie Faden ([id number]) on 08/29/2024 11:25:32 AM  Radiology: No results found.   Procedures   Medications Ordered in the ED - No data to display                                  Medical Decision Making Amount and/or Complexity of Data Reviewed Labs:  ordered. Radiology: ordered. ECG/medicine tests: ordered.  Risk Prescription drug management.   Patient EKG shows a paced rhythm which is unchanged from prior.  Patient's pacemaker interrogated and results reviewed with cardiologist on-call, who states that there were no events.  Patient has not had any ectopy here.  Patient is complaining of nasal congestion for about a month. Labs here are reassuring including a negative COVID test.  Chest x-ray is negative.  Troponins are flat.  Patient requesting medication for sinusitis will place on doxycycline .  Will discharge home    Final diagnoses:  None    ED Discharge Orders     None          Dasie Faden, MD 08/29/24 1521  "

## 2024-08-29 NOTE — ED Notes (Signed)
 Pacemaker interrogated. [employer] SN [id number]

## 2024-08-29 NOTE — TOC CM/SW Note (Signed)
 TOC consult received for d/c planning needs. Met with patient at bedside. Patient has a live in caregiver. She declines need for PT at home at this time. Patient to f/u with her MD next week regarding her R foot. Patient encouraged to discuss possible HH needs at that time. Patient has a FWW at home for ambulation if needed.   Patient did ask about having protein supplements delivered through her [employer] benefits, patient was encouraged to contact member services for benefit information.   No further CM needs reported at this time. Per nursing staff, patient is to be discharged home.  Merilee Batty, MSN, RN Case Management [phone]

## 2024-08-29 NOTE — ED Notes (Signed)
 Socks and warm blanket placed on the pt by this RN.

## 2024-08-29 NOTE — ED Notes (Signed)
 Pt denies any chest pain or headache. Endorses shob and dizziness. O2 at 100%. Pt is sleepy and continues to fall asleep.

## 2024-09-17 ENCOUNTER — Telehealth: Payer: Self-pay

## 2024-09-18 ENCOUNTER — Telehealth: Payer: Self-pay | Admitting: Internal Medicine

## 2024-09-18 NOTE — Telephone Encounter (Signed)
 PT states she had an ED visit 1/15 and was advised her heart device was put in incorrectly and is the cause of her severe stomach pain. Please advise.

## 2024-09-18 NOTE — Telephone Encounter (Signed)
 Attempted to call patient. Unable to leave voicemail.

## 2024-09-19 NOTE — Telephone Encounter (Signed)
 Patient was returning call. Please advise ?

## 2024-09-19 NOTE — Telephone Encounter (Signed)
 Spoke with the patient who states that when she was in the ER she was told that her device was not put in right. She states that they told her that this was causing her issues. She complains of stomachaches, headaches and losing muscle tone. I advised that these symptoms would not be related to her pacemaker. Advised that her pacemaker was interrogated while she was in the ER and it did not show anything abnormal. Encouraged patient to contact her PCP. Patient also mentions a previous colonoscopy and that she was told to follow up with the provider that did her colonoscopy. She thinks that it was someone at [facility]. I have provided her with their number.

## 2025-07-28 ENCOUNTER — Ambulatory Visit
